# Patient Record
Sex: Female | Born: 1965
Health system: Southern US, Community
[De-identification: ages and names within clinical notes are randomized; demographics above are authoritative.]

## PROBLEM LIST (undated history)

## (undated) DIAGNOSIS — E1165 Type 2 diabetes mellitus with hyperglycemia: Secondary | ICD-10-CM

## (undated) DIAGNOSIS — I2781 Cor pulmonale (chronic): Secondary | ICD-10-CM

## (undated) DIAGNOSIS — I5033 Acute on chronic diastolic (congestive) heart failure: Secondary | ICD-10-CM

## (undated) DIAGNOSIS — I272 Pulmonary hypertension, unspecified: Secondary | ICD-10-CM

## (undated) DIAGNOSIS — I5032 Chronic diastolic (congestive) heart failure: Secondary | ICD-10-CM

## (undated) DIAGNOSIS — Z8639 Personal history of other endocrine, nutritional and metabolic disease: Secondary | ICD-10-CM

## (undated) DIAGNOSIS — G4733 Obstructive sleep apnea (adult) (pediatric): Secondary | ICD-10-CM

## (undated) DIAGNOSIS — R590 Localized enlarged lymph nodes: Secondary | ICD-10-CM

## (undated) DIAGNOSIS — Z6841 Body Mass Index (BMI) 40.0 and over, adult: Secondary | ICD-10-CM

## (undated) DIAGNOSIS — E162 Hypoglycemia, unspecified: Secondary | ICD-10-CM

## (undated) DIAGNOSIS — I119 Hypertensive heart disease without heart failure: Secondary | ICD-10-CM

## (undated) DIAGNOSIS — J329 Chronic sinusitis, unspecified: Secondary | ICD-10-CM

## (undated) DIAGNOSIS — I4819 Other persistent atrial fibrillation: Secondary | ICD-10-CM

## (undated) DIAGNOSIS — Z794 Long term (current) use of insulin: Secondary | ICD-10-CM

## (undated) DIAGNOSIS — I4891 Unspecified atrial fibrillation: Secondary | ICD-10-CM

## (undated) DIAGNOSIS — IMO0002 Reserved for concepts with insufficient information to code with codable children: Secondary | ICD-10-CM

## (undated) DIAGNOSIS — R079 Chest pain, unspecified: Secondary | ICD-10-CM

## (undated) DIAGNOSIS — I10 Essential (primary) hypertension: Secondary | ICD-10-CM

## (undated) DIAGNOSIS — I471 Supraventricular tachycardia: Secondary | ICD-10-CM

## (undated) DIAGNOSIS — F319 Bipolar disorder, unspecified: Secondary | ICD-10-CM

## (undated) HISTORY — DX: Chronic sinusitis, unspecified: J32.9

## (undated) HISTORY — DX: Essential (primary) hypertension: I10

## (undated) HISTORY — PX: OTHER SURGICAL HISTORY: SHX169

## (undated) HISTORY — DX: Obstructive sleep apnea (adult) (pediatric): G47.33

## (undated) HISTORY — DX: Morbid (severe) obesity due to excess calories: E66.01

## (undated) HISTORY — DX: Type 2 diabetes mellitus with hyperglycemia: E11.65

## (undated) HISTORY — DX: Cor pulmonale (chronic): I27.81

## (undated) HISTORY — DX: Acute on chronic diastolic (congestive) heart failure: I50.33

## (undated) HISTORY — DX: Supraventricular tachycardia: I47.1

## (undated) HISTORY — DX: Localized enlarged lymph nodes: R59.0

## (undated) HISTORY — DX: Pulmonary hypertension, unspecified: I27.20

---

## 2002-02-02 ENCOUNTER — Encounter (INDEPENDENT_AMBULATORY_CARE_PROVIDER_SITE_OTHER): Payer: Self-pay | Admitting: Family Medicine

## 2002-02-02 LAB — CONVERTED CEMR LAB
RBC count: 4.54 10*6/uL
WBC, blood: 3.9 10*3/uL

## 2003-06-11 ENCOUNTER — Encounter (INDEPENDENT_AMBULATORY_CARE_PROVIDER_SITE_OTHER): Payer: Self-pay | Admitting: Family Medicine

## 2003-06-11 LAB — CONVERTED CEMR LAB
TSH: 3.398 microintl units/mL
TSH: 3.398 microintl units/mL

## 2004-06-10 ENCOUNTER — Ambulatory Visit: Payer: Self-pay | Admitting: Internal Medicine

## 2004-06-10 ENCOUNTER — Ambulatory Visit: Payer: Self-pay | Admitting: Family Medicine

## 2004-07-04 ENCOUNTER — Ambulatory Visit: Payer: Self-pay | Admitting: *Deleted

## 2004-07-23 ENCOUNTER — Ambulatory Visit: Payer: Self-pay | Admitting: Family Medicine

## 2004-09-18 ENCOUNTER — Ambulatory Visit: Payer: Self-pay | Admitting: Internal Medicine

## 2005-01-14 ENCOUNTER — Ambulatory Visit: Payer: Self-pay | Admitting: Family Medicine

## 2005-01-16 ENCOUNTER — Ambulatory Visit: Payer: Self-pay | Admitting: Family Medicine

## 2005-01-16 LAB — CONVERTED CEMR LAB: Urinalysis: ABNORMAL

## 2005-01-27 ENCOUNTER — Ambulatory Visit: Payer: Self-pay | Admitting: Family Medicine

## 2005-02-17 ENCOUNTER — Ambulatory Visit: Payer: Self-pay | Admitting: Family Medicine

## 2005-06-19 ENCOUNTER — Ambulatory Visit: Payer: Self-pay | Admitting: Family Medicine

## 2005-07-23 ENCOUNTER — Ambulatory Visit: Payer: Self-pay | Admitting: Family Medicine

## 2005-07-28 ENCOUNTER — Ambulatory Visit: Payer: Self-pay | Admitting: Family Medicine

## 2005-07-28 LAB — CONVERTED CEMR LAB

## 2005-08-25 ENCOUNTER — Ambulatory Visit (HOSPITAL_COMMUNITY): Admission: RE | Admit: 2005-08-25 | Discharge: 2005-08-25 | Payer: Self-pay | Admitting: Family Medicine

## 2005-09-15 ENCOUNTER — Encounter: Admission: RE | Admit: 2005-09-15 | Discharge: 2005-09-15 | Payer: Self-pay | Admitting: Family Medicine

## 2006-03-30 ENCOUNTER — Ambulatory Visit: Payer: Self-pay | Admitting: Family Medicine

## 2006-03-31 ENCOUNTER — Encounter (INDEPENDENT_AMBULATORY_CARE_PROVIDER_SITE_OTHER): Payer: Self-pay | Admitting: Family Medicine

## 2006-03-31 LAB — CONVERTED CEMR LAB: Microalbumin U total vol: 0.84 mg/L

## 2006-05-05 ENCOUNTER — Encounter: Admission: RE | Admit: 2006-05-05 | Discharge: 2006-05-05 | Payer: Self-pay | Admitting: Family Medicine

## 2006-05-17 ENCOUNTER — Ambulatory Visit: Payer: Self-pay | Admitting: Family Medicine

## 2006-05-17 ENCOUNTER — Inpatient Hospital Stay (HOSPITAL_COMMUNITY): Admission: EM | Admit: 2006-05-17 | Discharge: 2006-05-19 | Payer: Self-pay | Admitting: Emergency Medicine

## 2006-05-18 ENCOUNTER — Encounter (INDEPENDENT_AMBULATORY_CARE_PROVIDER_SITE_OTHER): Payer: Self-pay | Admitting: Cardiology

## 2006-05-21 ENCOUNTER — Emergency Department (HOSPITAL_COMMUNITY): Admission: EM | Admit: 2006-05-21 | Discharge: 2006-05-21 | Payer: Self-pay | Admitting: Emergency Medicine

## 2006-05-25 ENCOUNTER — Ambulatory Visit: Payer: Self-pay | Admitting: Family Medicine

## 2006-05-26 ENCOUNTER — Ambulatory Visit: Payer: Self-pay | Admitting: Family Medicine

## 2006-07-13 ENCOUNTER — Ambulatory Visit: Payer: Self-pay | Admitting: Family Medicine

## 2006-08-23 ENCOUNTER — Ambulatory Visit: Payer: Self-pay | Admitting: Family Medicine

## 2006-09-02 ENCOUNTER — Ambulatory Visit: Payer: Self-pay | Admitting: Family Medicine

## 2006-09-02 LAB — CONVERTED CEMR LAB
Blood Glucose, Fasting: 115 mg/dL
Hgb A1c MFr Bld: 6.1 %

## 2006-10-12 ENCOUNTER — Encounter: Admission: RE | Admit: 2006-10-12 | Discharge: 2006-10-12 | Payer: Self-pay | Admitting: Family Medicine

## 2006-10-12 ENCOUNTER — Ambulatory Visit: Payer: Self-pay | Admitting: Family Medicine

## 2007-03-10 ENCOUNTER — Ambulatory Visit: Payer: Self-pay | Admitting: Family Medicine

## 2007-04-18 ENCOUNTER — Encounter (INDEPENDENT_AMBULATORY_CARE_PROVIDER_SITE_OTHER): Payer: Self-pay | Admitting: Family Medicine

## 2007-04-18 DIAGNOSIS — E669 Obesity, unspecified: Secondary | ICD-10-CM | POA: Insufficient documentation

## 2007-04-18 DIAGNOSIS — D649 Anemia, unspecified: Secondary | ICD-10-CM

## 2007-04-18 DIAGNOSIS — F411 Generalized anxiety disorder: Secondary | ICD-10-CM

## 2007-04-18 DIAGNOSIS — E039 Hypothyroidism, unspecified: Secondary | ICD-10-CM | POA: Insufficient documentation

## 2007-04-18 DIAGNOSIS — Z8679 Personal history of other diseases of the circulatory system: Secondary | ICD-10-CM | POA: Insufficient documentation

## 2007-04-19 ENCOUNTER — Encounter: Payer: Self-pay | Admitting: Internal Medicine

## 2007-04-19 ENCOUNTER — Ambulatory Visit: Payer: Self-pay | Admitting: Internal Medicine

## 2007-04-19 LAB — CONVERTED CEMR LAB
Preg, Serum: NEGATIVE
TSH: 4.143 microintl units/mL (ref 0.350–5.50)

## 2007-04-26 ENCOUNTER — Ambulatory Visit (HOSPITAL_COMMUNITY): Admission: RE | Admit: 2007-04-26 | Discharge: 2007-04-26 | Payer: Self-pay | Admitting: Internal Medicine

## 2007-05-02 ENCOUNTER — Encounter: Admission: RE | Admit: 2007-05-02 | Discharge: 2007-05-02 | Payer: Self-pay | Admitting: Family Medicine

## 2007-05-11 ENCOUNTER — Ambulatory Visit: Payer: Self-pay | Admitting: Internal Medicine

## 2007-06-15 ENCOUNTER — Encounter (INDEPENDENT_AMBULATORY_CARE_PROVIDER_SITE_OTHER): Payer: Self-pay | Admitting: *Deleted

## 2007-07-04 ENCOUNTER — Ambulatory Visit (HOSPITAL_COMMUNITY): Admission: RE | Admit: 2007-07-04 | Discharge: 2007-07-04 | Payer: Self-pay | Admitting: Internal Medicine

## 2007-07-21 ENCOUNTER — Ambulatory Visit: Payer: Self-pay | Admitting: Internal Medicine

## 2007-07-26 ENCOUNTER — Ambulatory Visit: Payer: Self-pay | Admitting: Internal Medicine

## 2007-08-11 ENCOUNTER — Ambulatory Visit: Payer: Self-pay | Admitting: Internal Medicine

## 2007-08-11 LAB — CONVERTED CEMR LAB: Microalb, Ur: 12.3 mg/dL — ABNORMAL HIGH (ref 0.00–1.89)

## 2007-09-12 ENCOUNTER — Ambulatory Visit: Payer: Self-pay | Admitting: Internal Medicine

## 2007-10-14 ENCOUNTER — Ambulatory Visit: Payer: Self-pay | Admitting: Internal Medicine

## 2007-11-09 ENCOUNTER — Ambulatory Visit: Payer: Self-pay | Admitting: Internal Medicine

## 2007-12-15 ENCOUNTER — Encounter (INDEPENDENT_AMBULATORY_CARE_PROVIDER_SITE_OTHER): Payer: Self-pay | Admitting: Family Medicine

## 2007-12-15 ENCOUNTER — Ambulatory Visit: Payer: Self-pay | Admitting: Internal Medicine

## 2007-12-15 LAB — CONVERTED CEMR LAB
ALT: 11 units/L (ref 0–35)
AST: 21 units/L (ref 0–37)
Albumin: 4.1 g/dL (ref 3.5–5.2)
Alkaline Phosphatase: 116 units/L (ref 39–117)
BUN: 7 mg/dL (ref 6–23)
Basophils Absolute: 0 10*3/uL (ref 0.0–0.1)
Basophils Relative: 0 % (ref 0–1)
Cholesterol: 158 mg/dL (ref 0–200)
Creatinine, Ser: 0.46 mg/dL (ref 0.40–1.20)
Eosinophils Absolute: 0.1 10*3/uL (ref 0.0–0.7)
Eosinophils Relative: 1 % (ref 0–5)
Glucose, Bld: 165 mg/dL — ABNORMAL HIGH (ref 70–99)
HCT: 41.2 % (ref 36.0–46.0)
HDL: 41 mg/dL (ref >39)
Hemoglobin: 12.4 g/dL (ref 12.0–15.0)
LDL Cholesterol: 80 mg/dL (ref 0–99)
Lymphocytes Relative: 34 % (ref 12–46)
MCHC: 30.1 g/dL (ref 30.0–36.0)
MCV: 90.7 fL (ref 78.0–100.0)
Monocytes Absolute: 0.4 10*3/uL (ref 0.1–1.0)
Monocytes Relative: 6 % (ref 3–12)
Neutro Abs: 3.6 10*3/uL (ref 1.7–7.7)
Neutrophils Relative %: 58 % (ref 43–77)
Platelets: 268 10*3/uL (ref 150–400)
Potassium: 4.5 meq/L (ref 3.5–5.3)
RBC: 4.54 M/uL (ref 3.87–5.11)
RDW: 14.1 % (ref 11.5–15.5)
Sodium: 139 meq/L (ref 135–145)
Total Bilirubin: 0.4 mg/dL (ref 0.3–1.2)
Total CHOL/HDL Ratio: 3.9
Total Protein: 7.5 g/dL (ref 6.0–8.3)
Triglycerides: 183 mg/dL — ABNORMAL HIGH (ref <150)
VLDL: 37 mg/dL (ref 0–40)
WBC: 6.2 10*3/uL (ref 4.0–10.5)

## 2008-03-13 ENCOUNTER — Ambulatory Visit: Payer: Self-pay | Admitting: Internal Medicine

## 2008-05-14 ENCOUNTER — Ambulatory Visit: Payer: Self-pay | Admitting: Internal Medicine

## 2008-11-16 ENCOUNTER — Ambulatory Visit: Payer: Self-pay | Admitting: Internal Medicine

## 2009-01-16 ENCOUNTER — Emergency Department (HOSPITAL_COMMUNITY): Admission: EM | Admit: 2009-01-16 | Discharge: 2009-01-16 | Payer: Self-pay | Admitting: Emergency Medicine

## 2009-01-16 ENCOUNTER — Encounter (INDEPENDENT_AMBULATORY_CARE_PROVIDER_SITE_OTHER): Payer: Self-pay | Admitting: Emergency Medicine

## 2009-01-16 ENCOUNTER — Ambulatory Visit: Payer: Self-pay | Admitting: Vascular Surgery

## 2009-01-22 ENCOUNTER — Emergency Department (HOSPITAL_COMMUNITY): Admission: EM | Admit: 2009-01-22 | Discharge: 2009-01-22 | Payer: Self-pay | Admitting: Family Medicine

## 2009-01-30 ENCOUNTER — Ambulatory Visit: Payer: Self-pay | Admitting: Internal Medicine

## 2009-02-20 ENCOUNTER — Inpatient Hospital Stay (HOSPITAL_COMMUNITY): Admission: AD | Admit: 2009-02-20 | Discharge: 2009-03-02 | Payer: Self-pay | Admitting: Psychiatry

## 2009-02-20 ENCOUNTER — Emergency Department (HOSPITAL_COMMUNITY): Admission: EM | Admit: 2009-02-20 | Discharge: 2009-02-20 | Payer: Self-pay | Admitting: Emergency Medicine

## 2009-02-20 ENCOUNTER — Ambulatory Visit: Payer: Self-pay | Admitting: Psychiatry

## 2009-03-13 ENCOUNTER — Ambulatory Visit: Payer: Self-pay | Admitting: Internal Medicine

## 2009-03-14 ENCOUNTER — Ambulatory Visit: Payer: Self-pay | Admitting: Internal Medicine

## 2009-03-20 ENCOUNTER — Ambulatory Visit: Payer: Self-pay | Admitting: Internal Medicine

## 2009-03-22 ENCOUNTER — Inpatient Hospital Stay (HOSPITAL_COMMUNITY): Admission: EM | Admit: 2009-03-22 | Discharge: 2009-03-24 | Payer: Self-pay | Admitting: Emergency Medicine

## 2009-03-22 ENCOUNTER — Encounter: Payer: Self-pay | Admitting: Internal Medicine

## 2009-03-22 ENCOUNTER — Ambulatory Visit: Payer: Self-pay | Admitting: Infectious Diseases

## 2009-03-25 ENCOUNTER — Ambulatory Visit: Payer: Self-pay | Admitting: Internal Medicine

## 2009-04-06 ENCOUNTER — Emergency Department (HOSPITAL_BASED_OUTPATIENT_CLINIC_OR_DEPARTMENT_OTHER): Admission: EM | Admit: 2009-04-06 | Discharge: 2009-04-07 | Payer: Self-pay | Admitting: Emergency Medicine

## 2009-04-09 ENCOUNTER — Encounter: Payer: Self-pay | Admitting: Cardiology

## 2009-04-17 DIAGNOSIS — F319 Bipolar disorder, unspecified: Secondary | ICD-10-CM | POA: Insufficient documentation

## 2009-04-24 ENCOUNTER — Ambulatory Visit: Payer: Self-pay | Admitting: Internal Medicine

## 2009-04-29 DIAGNOSIS — R609 Edema, unspecified: Secondary | ICD-10-CM

## 2009-04-29 DIAGNOSIS — N390 Urinary tract infection, site not specified: Secondary | ICD-10-CM | POA: Insufficient documentation

## 2009-04-29 DIAGNOSIS — I214 Non-ST elevation (NSTEMI) myocardial infarction: Secondary | ICD-10-CM

## 2009-04-29 DIAGNOSIS — E0590 Thyrotoxicosis, unspecified without thyrotoxic crisis or storm: Secondary | ICD-10-CM | POA: Insufficient documentation

## 2009-04-30 ENCOUNTER — Ambulatory Visit: Payer: Self-pay | Admitting: Cardiology

## 2009-05-06 ENCOUNTER — Ambulatory Visit: Payer: Self-pay

## 2009-05-07 ENCOUNTER — Ambulatory Visit: Payer: Self-pay

## 2009-05-07 ENCOUNTER — Ambulatory Visit: Payer: Self-pay | Admitting: Cardiology

## 2009-05-07 ENCOUNTER — Encounter: Payer: Self-pay | Admitting: Cardiology

## 2009-05-15 ENCOUNTER — Ambulatory Visit: Payer: Self-pay | Admitting: Cardiology

## 2009-05-16 LAB — CONVERTED CEMR LAB
CO2: 31 meq/L (ref 19–32)
Calcium: 8.7 mg/dL (ref 8.4–10.5)
Creatinine, Ser: 0.6 mg/dL (ref 0.4–1.2)
GFR calc non Af Amer: 141.53 mL/min (ref >60)
Glucose, Bld: 86 mg/dL (ref 70–99)
Sodium: 137 meq/L (ref 135–145)

## 2009-06-05 ENCOUNTER — Ambulatory Visit: Payer: Self-pay | Admitting: Family Medicine

## 2009-06-12 ENCOUNTER — Encounter: Payer: Self-pay | Admitting: Family Medicine

## 2009-06-12 ENCOUNTER — Ambulatory Visit: Payer: Self-pay | Admitting: Internal Medicine

## 2009-06-12 LAB — CONVERTED CEMR LAB
ALT: <8 units/L (ref 0–35)
AST: 20 units/L (ref 0–37)
Alkaline Phosphatase: 78 units/L (ref 39–117)
BUN: 11 mg/dL (ref 6–23)
Calcium: 9 mg/dL (ref 8.4–10.5)
Chloride: 104 meq/L (ref 96–112)
Creatinine, Ser: 0.61 mg/dL (ref 0.40–1.20)
HDL: 56 mg/dL (ref >39)
LDL Cholesterol: 73 mg/dL (ref 0–99)
Pro B Natriuretic peptide (BNP): 40.9 pg/mL (ref 0.0–100.0)
Total CHOL/HDL Ratio: 2.6
VLDL: 15 mg/dL (ref 0–40)

## 2009-06-14 ENCOUNTER — Ambulatory Visit: Payer: Self-pay | Admitting: Internal Medicine

## 2009-07-04 ENCOUNTER — Ambulatory Visit: Payer: Self-pay | Admitting: Cardiology

## 2009-07-18 ENCOUNTER — Ambulatory Visit: Payer: Self-pay | Admitting: Vascular Surgery

## 2009-07-24 ENCOUNTER — Emergency Department (HOSPITAL_COMMUNITY): Admission: EM | Admit: 2009-07-24 | Discharge: 2009-07-24 | Payer: Self-pay | Admitting: Emergency Medicine

## 2009-07-30 ENCOUNTER — Telehealth (INDEPENDENT_AMBULATORY_CARE_PROVIDER_SITE_OTHER): Payer: Self-pay | Admitting: *Deleted

## 2009-07-31 ENCOUNTER — Ambulatory Visit: Payer: Self-pay | Admitting: Internal Medicine

## 2009-08-02 ENCOUNTER — Ambulatory Visit: Payer: Self-pay | Admitting: Internal Medicine

## 2009-08-06 ENCOUNTER — Encounter: Payer: Self-pay | Admitting: Family Medicine

## 2009-08-06 ENCOUNTER — Ambulatory Visit (HOSPITAL_COMMUNITY): Admission: RE | Admit: 2009-08-06 | Discharge: 2009-08-06 | Payer: Self-pay | Admitting: Family Medicine

## 2009-08-21 ENCOUNTER — Ambulatory Visit: Payer: Self-pay | Admitting: Internal Medicine

## 2009-08-26 ENCOUNTER — Ambulatory Visit: Payer: Self-pay | Admitting: Family Medicine

## 2009-08-26 DIAGNOSIS — M1710 Unilateral primary osteoarthritis, unspecified knee: Secondary | ICD-10-CM

## 2009-11-04 ENCOUNTER — Emergency Department (HOSPITAL_COMMUNITY): Admission: EM | Admit: 2009-11-04 | Discharge: 2009-11-05 | Payer: Self-pay | Admitting: Emergency Medicine

## 2009-11-04 ENCOUNTER — Ambulatory Visit: Payer: Self-pay | Admitting: Family Medicine

## 2009-11-04 ENCOUNTER — Encounter (INDEPENDENT_AMBULATORY_CARE_PROVIDER_SITE_OTHER): Payer: Self-pay | Admitting: Internal Medicine

## 2009-11-04 LAB — CONVERTED CEMR LAB: Sed Rate: 9 mm/hr (ref 0–22)

## 2009-11-08 ENCOUNTER — Ambulatory Visit: Payer: Self-pay | Admitting: Internal Medicine

## 2009-11-11 ENCOUNTER — Ambulatory Visit: Payer: Self-pay | Admitting: Internal Medicine

## 2009-11-14 ENCOUNTER — Ambulatory Visit: Payer: Self-pay | Admitting: Internal Medicine

## 2009-11-14 LAB — CONVERTED CEMR LAB
BUN: 14 mg/dL (ref 6–23)
Calcium: 9.6 mg/dL (ref 8.4–10.5)
Creatinine, Ser: 0.61 mg/dL (ref 0.40–1.20)
Glucose, Bld: 119 mg/dL — ABNORMAL HIGH (ref 70–99)

## 2009-11-21 ENCOUNTER — Ambulatory Visit: Payer: Self-pay | Admitting: Internal Medicine

## 2009-11-28 ENCOUNTER — Ambulatory Visit: Payer: Self-pay | Admitting: Internal Medicine

## 2009-12-05 ENCOUNTER — Ambulatory Visit: Payer: Self-pay | Admitting: Internal Medicine

## 2009-12-19 ENCOUNTER — Ambulatory Visit: Payer: Self-pay | Admitting: Internal Medicine

## 2009-12-26 ENCOUNTER — Ambulatory Visit: Payer: Self-pay | Admitting: Internal Medicine

## 2009-12-26 LAB — CONVERTED CEMR LAB
AST: 24 units/L (ref 0–37)
BUN: 19 mg/dL (ref 6–23)
Basophils Relative: 0 % (ref 0–1)
CO2: 24 meq/L (ref 19–32)
CRP: 0.8 mg/dL — ABNORMAL HIGH (ref <0.6)
Calcium: 9.5 mg/dL (ref 8.4–10.5)
Chloride: 99 meq/L (ref 96–112)
Creatinine, Ser: 0.8 mg/dL (ref 0.40–1.20)
Eosinophils Absolute: 0 10*3/uL (ref 0.0–0.7)
Eosinophils Relative: 1 % (ref 0–5)
Ferritin: 26 ng/mL (ref 10–291)
HCT: 39.2 % (ref 36.0–46.0)
Iron: 65 ug/dL (ref 42–145)
Lymphs Abs: 2.3 10*3/uL (ref 0.7–4.0)
MCHC: 32.9 g/dL (ref 30.0–36.0)
MCV: 85.4 fL (ref 78.0–100.0)
Neutrophils Relative %: 62 % (ref 43–77)
Platelets: 303 10*3/uL (ref 150–400)
RDW: 13.4 % (ref 11.5–15.5)
Total Bilirubin: 0.3 mg/dL (ref 0.3–1.2)
UIBC: 369 ug/dL
WBC: 7.5 10*3/uL (ref 4.0–10.5)

## 2010-01-15 ENCOUNTER — Ambulatory Visit: Payer: Self-pay | Admitting: Internal Medicine

## 2010-04-15 ENCOUNTER — Ambulatory Visit: Payer: Self-pay | Admitting: Internal Medicine

## 2010-04-16 ENCOUNTER — Encounter: Admission: RE | Admit: 2010-04-16 | Discharge: 2010-04-16 | Payer: Self-pay | Admitting: Family Medicine

## 2010-04-16 ENCOUNTER — Encounter (INDEPENDENT_AMBULATORY_CARE_PROVIDER_SITE_OTHER): Payer: Self-pay | Admitting: Family Medicine

## 2010-04-16 LAB — CONVERTED CEMR LAB: Microalb, Ur: 2.53 mg/dL — ABNORMAL HIGH (ref 0.00–1.89)

## 2010-04-30 ENCOUNTER — Ambulatory Visit: Payer: Self-pay | Admitting: Internal Medicine

## 2010-04-30 ENCOUNTER — Encounter (INDEPENDENT_AMBULATORY_CARE_PROVIDER_SITE_OTHER): Payer: Self-pay | Admitting: Family Medicine

## 2010-04-30 LAB — CONVERTED CEMR LAB
AST: 24 units/L (ref 0–37)
Alkaline Phosphatase: 100 units/L (ref 39–117)
BUN: 7 mg/dL (ref 6–23)
Creatinine, Ser: 0.58 mg/dL (ref 0.40–1.20)
HDL: 54 mg/dL (ref >39)
LDL Cholesterol: 100 mg/dL — ABNORMAL HIGH (ref 0–99)
TSH: 3.269 microintl units/mL (ref 0.350–4.500)
Total CHOL/HDL Ratio: 3.4
VLDL: 30 mg/dL (ref 0–40)
Vit D, 25-Hydroxy: 17 ng/mL — ABNORMAL LOW (ref 30–89)

## 2010-05-13 ENCOUNTER — Ambulatory Visit: Payer: Self-pay | Admitting: Internal Medicine

## 2010-08-26 ENCOUNTER — Encounter (INDEPENDENT_AMBULATORY_CARE_PROVIDER_SITE_OTHER): Payer: Self-pay | Admitting: *Deleted

## 2010-08-26 LAB — CONVERTED CEMR LAB
Chloride: 97 meq/L (ref 96–112)
Creatinine, Ser: 0.87 mg/dL (ref 0.40–1.20)
Platelets: 274 10*3/uL (ref 150–400)
Potassium: 4.1 meq/L (ref 3.5–5.3)
RDW: 13.6 % (ref 11.5–15.5)
Sodium: 137 meq/L (ref 135–145)

## 2010-10-19 ENCOUNTER — Encounter: Payer: Self-pay | Admitting: Internal Medicine

## 2010-10-26 LAB — CONVERTED CEMR LAB
BUN: 13 mg/dL (ref 6–23)
CO2: 31 meq/L (ref 19–32)
Calcium: 8.6 mg/dL (ref 8.4–10.5)
Creatinine, Ser: 0.6 mg/dL (ref 0.4–1.2)
Pro B Natriuretic peptide (BNP): 342 pg/mL — ABNORMAL HIGH (ref 0.0–100.0)

## 2010-12-17 LAB — URINALYSIS, ROUTINE W REFLEX MICROSCOPIC
Glucose, UA: 250 mg/dL — AB
Hgb urine dipstick: NEGATIVE
Specific Gravity, Urine: 1.02 (ref 1.005–1.030)
Urobilinogen, UA: 0.2 mg/dL (ref 0.0–1.0)

## 2010-12-17 LAB — DIFFERENTIAL
Basophils Relative: 1 % (ref 0–1)
Lymphocytes Relative: 34 % (ref 12–46)
Lymphs Abs: 2.4 10*3/uL (ref 0.7–4.0)
Monocytes Relative: 9 % (ref 3–12)
Neutro Abs: 4 10*3/uL (ref 1.7–7.7)
Neutrophils Relative %: 56 % (ref 43–77)

## 2010-12-17 LAB — COMPREHENSIVE METABOLIC PANEL
BUN: 15 mg/dL (ref 6–23)
Calcium: 9.1 mg/dL (ref 8.4–10.5)
Creatinine, Ser: 0.57 mg/dL (ref 0.4–1.2)
Glucose, Bld: 198 mg/dL — ABNORMAL HIGH (ref 70–99)
Total Protein: 7.5 g/dL (ref 6.0–8.3)

## 2010-12-17 LAB — URINE MICROSCOPIC-ADD ON

## 2010-12-17 LAB — CBC
HCT: 37.1 % (ref 36.0–46.0)
Hemoglobin: 12.4 g/dL (ref 12.0–15.0)
MCHC: 33.4 g/dL (ref 30.0–36.0)
RDW: 13.9 % (ref 11.5–15.5)

## 2010-12-17 LAB — RAPID URINE DRUG SCREEN, HOSP PERFORMED
Barbiturates: NOT DETECTED
Opiates: NOT DETECTED

## 2010-12-19 ENCOUNTER — Emergency Department (HOSPITAL_COMMUNITY)
Admission: EM | Admit: 2010-12-19 | Discharge: 2010-12-20 | Disposition: A | Discharge status: Home / Self Care | Payer: Self-pay | Attending: Emergency Medicine | Admitting: Emergency Medicine

## 2010-12-19 DIAGNOSIS — I10 Essential (primary) hypertension: Secondary | ICD-10-CM | POA: Insufficient documentation

## 2010-12-19 DIAGNOSIS — E669 Obesity, unspecified: Secondary | ICD-10-CM | POA: Insufficient documentation

## 2010-12-19 DIAGNOSIS — I252 Old myocardial infarction: Secondary | ICD-10-CM | POA: Insufficient documentation

## 2010-12-19 DIAGNOSIS — F319 Bipolar disorder, unspecified: Secondary | ICD-10-CM | POA: Insufficient documentation

## 2010-12-19 DIAGNOSIS — I509 Heart failure, unspecified: Secondary | ICD-10-CM | POA: Insufficient documentation

## 2010-12-19 DIAGNOSIS — E1169 Type 2 diabetes mellitus with other specified complication: Secondary | ICD-10-CM | POA: Insufficient documentation

## 2010-12-19 LAB — CBC
MCV: 87.7 fL (ref 78.0–100.0)
Platelets: 225 10*3/uL (ref 150–400)
RBC: 4.46 MIL/uL (ref 3.87–5.11)
WBC: 8.7 10*3/uL (ref 4.0–10.5)

## 2010-12-19 LAB — DIFFERENTIAL
Basophils Relative: 0 % (ref 0–1)
Eosinophils Absolute: 0 10*3/uL (ref 0.0–0.7)
Lymphs Abs: 2.5 10*3/uL (ref 0.7–4.0)
Neutrophils Relative %: 63 % (ref 43–77)

## 2010-12-19 LAB — POCT I-STAT, CHEM 8
Calcium, Ion: 1.02 mmol/L — ABNORMAL LOW (ref 1.12–1.32)
Chloride: 103 mEq/L (ref 96–112)
HCT: 40 % (ref 36.0–46.0)

## 2010-12-19 LAB — GLUCOSE, CAPILLARY

## 2010-12-20 LAB — GLUCOSE, CAPILLARY
Glucose-Capillary: 290 mg/dL — ABNORMAL HIGH (ref 70–99)
Glucose-Capillary: 340 mg/dL — ABNORMAL HIGH (ref 70–99)

## 2010-12-21 ENCOUNTER — Emergency Department (HOSPITAL_COMMUNITY): Payer: Self-pay

## 2010-12-21 ENCOUNTER — Inpatient Hospital Stay (HOSPITAL_COMMUNITY)
Admission: EM | Admit: 2010-12-21 | Discharge: 2011-01-01 | DRG: 885 | Disposition: A | Discharge status: Home Health | Payer: Self-pay | Attending: Internal Medicine | Admitting: Internal Medicine

## 2010-12-21 DIAGNOSIS — I509 Heart failure, unspecified: Secondary | ICD-10-CM | POA: Diagnosis present

## 2010-12-21 DIAGNOSIS — F3110 Bipolar disorder, current episode manic without psychotic features, unspecified: Principal | ICD-10-CM | POA: Diagnosis present

## 2010-12-21 DIAGNOSIS — I5032 Chronic diastolic (congestive) heart failure: Secondary | ICD-10-CM | POA: Diagnosis present

## 2010-12-21 DIAGNOSIS — D539 Nutritional anemia, unspecified: Secondary | ICD-10-CM | POA: Diagnosis not present

## 2010-12-21 DIAGNOSIS — I10 Essential (primary) hypertension: Secondary | ICD-10-CM | POA: Diagnosis present

## 2010-12-21 DIAGNOSIS — G4700 Insomnia, unspecified: Secondary | ICD-10-CM | POA: Diagnosis present

## 2010-12-21 DIAGNOSIS — F411 Generalized anxiety disorder: Secondary | ICD-10-CM | POA: Diagnosis present

## 2010-12-21 DIAGNOSIS — N920 Excessive and frequent menstruation with regular cycle: Secondary | ICD-10-CM | POA: Diagnosis present

## 2010-12-21 DIAGNOSIS — E876 Hypokalemia: Secondary | ICD-10-CM | POA: Diagnosis not present

## 2010-12-21 DIAGNOSIS — E781 Pure hyperglyceridemia: Secondary | ICD-10-CM | POA: Diagnosis present

## 2010-12-21 DIAGNOSIS — Z7982 Long term (current) use of aspirin: Secondary | ICD-10-CM

## 2010-12-21 DIAGNOSIS — IMO0001 Reserved for inherently not codable concepts without codable children: Secondary | ICD-10-CM | POA: Diagnosis present

## 2010-12-21 DIAGNOSIS — Z87891 Personal history of nicotine dependence: Secondary | ICD-10-CM

## 2010-12-21 DIAGNOSIS — Z79899 Other long term (current) drug therapy: Secondary | ICD-10-CM

## 2010-12-21 LAB — POCT I-STAT 3, VENOUS BLOOD GAS (G3P V)
Bicarbonate: 27.9 mEq/L — ABNORMAL HIGH (ref 20.0–24.0)
TCO2: 29 mmol/L (ref 0–100)
pCO2, Ven: 39 mmHg — ABNORMAL LOW (ref 45.0–50.0)
pH, Ven: 7.462 — ABNORMAL HIGH (ref 7.250–7.300)

## 2010-12-21 LAB — POCT I-STAT, CHEM 8
BUN: 14 mg/dL (ref 6–23)
Chloride: 98 mEq/L (ref 96–112)
Potassium: 3.6 mEq/L (ref 3.5–5.1)
Sodium: 139 mEq/L (ref 135–145)

## 2010-12-21 LAB — COMPREHENSIVE METABOLIC PANEL
ALT: 32 U/L (ref 0–35)
AST: 65 U/L — ABNORMAL HIGH (ref 0–37)
Albumin: 3.4 g/dL — ABNORMAL LOW (ref 3.5–5.2)
Alkaline Phosphatase: 78 U/L (ref 39–117)
Calcium: 8.2 mg/dL — ABNORMAL LOW (ref 8.4–10.5)
GFR calc Af Amer: >60 mL/min (ref >60)
Glucose, Bld: 363 mg/dL — ABNORMAL HIGH (ref 70–99)
Potassium: 3.5 mEq/L (ref 3.5–5.1)
Sodium: 141 mEq/L (ref 135–145)
Total Protein: 6.5 g/dL (ref 6.0–8.3)

## 2010-12-21 LAB — GLUCOSE, CAPILLARY
Glucose-Capillary: 425 mg/dL — ABNORMAL HIGH (ref 70–99)
Glucose-Capillary: 314 mg/dL — ABNORMAL HIGH (ref 70–99)
Glucose-Capillary: 311 mg/dL — ABNORMAL HIGH (ref 70–99)

## 2010-12-21 LAB — CBC
HCT: 40.6 % (ref 36.0–46.0)
MCH: 28.9 pg (ref 26.0–34.0)
MCV: 88.8 fL (ref 78.0–100.0)
Platelets: 225 10*3/uL (ref 150–400)
RBC: 4.57 MIL/uL (ref 3.87–5.11)
RDW: 13.8 % (ref 11.5–15.5)

## 2010-12-21 LAB — BASIC METABOLIC PANEL
CO2: 24 mEq/L (ref 19–32)
Calcium: 8.1 mg/dL — ABNORMAL LOW (ref 8.4–10.5)
GFR calc Af Amer: >60 mL/min (ref >60)
GFR calc non Af Amer: >60 mL/min (ref >60)
Glucose, Bld: 356 mg/dL — ABNORMAL HIGH (ref 70–99)
Potassium: 3.6 mEq/L (ref 3.5–5.1)
Sodium: 138 mEq/L (ref 135–145)

## 2010-12-21 LAB — CK TOTAL AND CKMB (NOT AT ARMC)
CK, MB: 2.8 ng/mL (ref 0.3–4.0)
Relative Index: 0.7 (ref 0.0–2.5)
Total CK: 377 U/L — ABNORMAL HIGH (ref 7–177)

## 2010-12-21 LAB — DIFFERENTIAL
Eosinophils Absolute: 0 10*3/uL (ref 0.0–0.7)
Eosinophils Relative: 0 % (ref 0–5)
Lymphocytes Relative: 30 % (ref 12–46)
Lymphs Abs: 2.8 10*3/uL (ref 0.7–4.0)
Monocytes Relative: 8 % (ref 3–12)

## 2010-12-22 ENCOUNTER — Inpatient Hospital Stay (HOSPITAL_COMMUNITY): Payer: Self-pay

## 2010-12-22 ENCOUNTER — Encounter (HOSPITAL_COMMUNITY): Payer: Self-pay

## 2010-12-22 LAB — URINALYSIS, ROUTINE W REFLEX MICROSCOPIC
Bilirubin Urine: NEGATIVE
Hgb urine dipstick: NEGATIVE
Nitrite: NEGATIVE
Protein, ur: NEGATIVE mg/dL
Urobilinogen, UA: 0.2 mg/dL (ref 0.0–1.0)

## 2010-12-22 LAB — CARDIAC PANEL(CRET KIN+CKTOT+MB+TROPI)
CK, MB: 2.1 ng/mL (ref 0.3–4.0)
Relative Index: 0.9 (ref 0.0–2.5)
Total CK: 223 U/L — ABNORMAL HIGH (ref 7–177)

## 2010-12-22 LAB — HEPATIC FUNCTION PANEL
ALT: 27 U/L (ref 0–35)
AST: 65 U/L — ABNORMAL HIGH (ref 0–37)
Albumin: 3.2 g/dL — ABNORMAL LOW (ref 3.5–5.2)
Alkaline Phosphatase: 79 U/L (ref 39–117)
Total Bilirubin: 1.5 mg/dL — ABNORMAL HIGH (ref 0.3–1.2)

## 2010-12-22 LAB — LIPID PANEL
LDL Cholesterol: 73 mg/dL (ref 0–99)
Total CHOL/HDL Ratio: 4.7 RATIO
Triglycerides: 324 mg/dL — ABNORMAL HIGH (ref <150)
VLDL: 65 mg/dL — ABNORMAL HIGH (ref 0–40)

## 2010-12-22 LAB — RAPID URINE DRUG SCREEN, HOSP PERFORMED
Amphetamines: NOT DETECTED
Benzodiazepines: NOT DETECTED
Tetrahydrocannabinol: NOT DETECTED

## 2010-12-22 LAB — LACTATE DEHYDROGENASE: LDH: 267 U/L — ABNORMAL HIGH (ref 94–250)

## 2010-12-22 LAB — COMPREHENSIVE METABOLIC PANEL
AST: 52 U/L — ABNORMAL HIGH (ref 0–37)
Albumin: 3 g/dL — ABNORMAL LOW (ref 3.5–5.2)
BUN: 12 mg/dL (ref 6–23)
Chloride: 104 mEq/L (ref 96–112)
GFR calc non Af Amer: >60 mL/min (ref >60)
Potassium: 3.3 mEq/L — ABNORMAL LOW (ref 3.5–5.1)

## 2010-12-22 LAB — CBC
HCT: 38.5 % (ref 36.0–46.0)
Hemoglobin: 12 g/dL (ref 12.0–15.0)
MCV: 89.3 fL (ref 78.0–100.0)
Platelets: 199 10*3/uL (ref 150–400)
RBC: 4.31 MIL/uL (ref 3.87–5.11)
WBC: 5.9 10*3/uL (ref 4.0–10.5)

## 2010-12-22 LAB — GLUCOSE, CAPILLARY
Glucose-Capillary: 281 mg/dL — ABNORMAL HIGH (ref 70–99)
Glucose-Capillary: 282 mg/dL — ABNORMAL HIGH (ref 70–99)
Glucose-Capillary: 298 mg/dL — ABNORMAL HIGH (ref 70–99)

## 2010-12-22 LAB — T3, FREE: T3, Free: 2 pg/mL — ABNORMAL LOW (ref 2.3–4.2)

## 2010-12-22 LAB — MAGNESIUM: Magnesium: 1.7 mg/dL (ref 1.5–2.5)

## 2010-12-22 LAB — TSH: TSH: 2.568 u[IU]/mL (ref 0.350–4.500)

## 2010-12-22 LAB — T4, FREE: Free T4: 1.28 ng/dL (ref 0.80–1.80)

## 2010-12-22 LAB — HEMOGLOBIN A1C
Hgb A1c MFr Bld: 13 % — ABNORMAL HIGH (ref <5.7)
Mean Plasma Glucose: 326 mg/dL — ABNORMAL HIGH (ref <117)

## 2010-12-22 LAB — TECHNOLOGIST SMEAR REVIEW

## 2010-12-22 LAB — PREGNANCY, URINE: Preg Test, Ur: NEGATIVE

## 2010-12-22 LAB — RETICULOCYTES: Retic Count, Absolute: 33.8 10*3/uL (ref 19.0–186.0)

## 2010-12-22 MED ORDER — IOHEXOL 300 MG/ML  SOLN
100.0000 mL | Freq: Once | INTRAMUSCULAR | Status: AC | PRN
  Administered 2010-12-22: 100 mL via INTRAVENOUS

## 2010-12-23 LAB — GLUCOSE, CAPILLARY
Glucose-Capillary: 239 mg/dL — ABNORMAL HIGH (ref 70–99)
Glucose-Capillary: 261 mg/dL — ABNORMAL HIGH (ref 70–99)
Glucose-Capillary: 284 mg/dL — ABNORMAL HIGH (ref 70–99)
Glucose-Capillary: 479 mg/dL — ABNORMAL HIGH (ref 70–99)

## 2010-12-23 LAB — CBC
HCT: 40.8 % (ref 36.0–46.0)
Hemoglobin: 12.8 g/dL (ref 12.0–15.0)
MCH: 27.5 pg (ref 26.0–34.0)
MCHC: 31.4 g/dL (ref 30.0–36.0)
MCV: 87.7 fL (ref 78.0–100.0)

## 2010-12-23 LAB — COMPREHENSIVE METABOLIC PANEL
BUN: 5 mg/dL — ABNORMAL LOW (ref 6–23)
CO2: 28 mEq/L (ref 19–32)
Calcium: 8.5 mg/dL (ref 8.4–10.5)
Creatinine, Ser: 0.81 mg/dL (ref 0.4–1.2)
GFR calc Af Amer: >60 mL/min (ref >60)
GFR calc non Af Amer: >60 mL/min (ref >60)
Glucose, Bld: 233 mg/dL — ABNORMAL HIGH (ref 70–99)

## 2010-12-23 LAB — HAPTOGLOBIN: Haptoglobin: 267 mg/dL — ABNORMAL HIGH (ref 16–200)

## 2010-12-24 DIAGNOSIS — F312 Bipolar disorder, current episode manic severe with psychotic features: Secondary | ICD-10-CM

## 2010-12-24 LAB — GLUCOSE, CAPILLARY
Glucose-Capillary: 110 mg/dL — ABNORMAL HIGH (ref 70–99)
Glucose-Capillary: 305 mg/dL — ABNORMAL HIGH (ref 70–99)

## 2010-12-25 DIAGNOSIS — F312 Bipolar disorder, current episode manic severe with psychotic features: Secondary | ICD-10-CM

## 2010-12-25 LAB — BASIC METABOLIC PANEL
BUN: 14 mg/dL (ref 6–23)
Creatinine, Ser: 0.75 mg/dL (ref 0.4–1.2)
GFR calc Af Amer: >60 mL/min (ref >60)
GFR calc non Af Amer: >60 mL/min (ref >60)

## 2010-12-25 LAB — GLUCOSE, CAPILLARY
Glucose-Capillary: 144 mg/dL — ABNORMAL HIGH (ref 70–99)
Glucose-Capillary: 109 mg/dL — ABNORMAL HIGH (ref 70–99)
Glucose-Capillary: 129 mg/dL — ABNORMAL HIGH (ref 70–99)
Glucose-Capillary: 162 mg/dL — ABNORMAL HIGH (ref 70–99)
Glucose-Capillary: 178 mg/dL — ABNORMAL HIGH (ref 70–99)

## 2010-12-25 LAB — CBC
MCH: 28.3 pg (ref 26.0–34.0)
MCHC: 31.5 g/dL (ref 30.0–36.0)
Platelets: 211 10*3/uL (ref 150–400)
RBC: 4.42 MIL/uL (ref 3.87–5.11)

## 2010-12-26 LAB — GLUCOSE, CAPILLARY
Glucose-Capillary: 145 mg/dL — ABNORMAL HIGH (ref 70–99)
Glucose-Capillary: 114 mg/dL — ABNORMAL HIGH (ref 70–99)

## 2010-12-27 LAB — GLUCOSE, CAPILLARY
Glucose-Capillary: 194 mg/dL — ABNORMAL HIGH (ref 70–99)
Glucose-Capillary: 290 mg/dL — ABNORMAL HIGH (ref 70–99)
Glucose-Capillary: 272 mg/dL — ABNORMAL HIGH (ref 70–99)

## 2010-12-27 NOTE — Consult Note (Signed)
  [name], [name]               ACCOUNT NO.:  [id number]  MEDICAL RECORD NO.:  [id number]          PATIENT TYPE:  INP  LOCATION:                               FACILITY:  [facility]  PHYSICIAN:  Eulogio Ditch, MD DATE OF BIRTH:  [birth year]  DATE OF CONSULTATION:  12/23/2010 DATE OF DISCHARGE:                                CONSULTATION   REASON FOR CONSULTATION:  History of bipolar disorder, noncompliant with medications.  HISTORY OF PRESENT ILLNESS:  I saw the patient and reviewed the medical records.  I also spoke with Dr. Mahala Menghini.  [name] who has a history of bipolar disorder, she is currently confused, unable to provide any logical history.  The patient stopped taking medication 2 weeks back, on asking why she stopped the medication, she told me that she was feeling happy and good, so that is why she thought she do not need medication.  The patient's brother told me that the patient when talks with him on the phone while he was in [place], she appeared confused.  The patient lives with the mother who is currently out of town but will be coming back by tomorrow.  The patient was admitted to behavioral health in the past and stayed 10 days and was on Depakote and Risperdal.  Currently, she is on Latuda 80 mg p.o. daily and Depakote 1000 mg p.o. daily.  Her Depakote level is less than 10.  She is noncompliant with her medications.  The patient's brother also told me that the patient slept only 10 hours during the week time.  The patient's family is very much concerned about the patient.  I told the family that we will speak with the patient again tomorrow through a [place] translator.  MENTAL STATUS EXAM:  The patient is calm, cooperative, but unable to provide any logical history, seems to be internally preoccupied but denied hearing any voices.  Her affect is constricted, alert, awake, oriented to time, place and person.  Attention and concentration  poor. Abstraction ability poor.  Fund of knowledge poor.  Insight and judgment poor.  DIAGNOSES:  AXIS I:  As per history bipolar disorder, mixed type. AXIS II:  Deferred. AXIS III:  See medical notes. AXIS IV:  Noncompliant with her medications. AXIS V:  30 to 40.  RECOMMENDATIONS: 1. I discontinued Latuda and told Dr. Mahala Menghini to start the patient on     Haldol 10 mg at bedtime and start her on Depakote 1000 mg p.o.     daily. 2. I will follow up on this patient.     Eulogio Ditch, MD     SA/MEDQ  D:  12/23/2010  T:  12/23/2010  Job:  [id number]  Electronically Signed by Eulogio Ditch  on 12/27/2010 05:55:45 PM

## 2010-12-28 DIAGNOSIS — F312 Bipolar disorder, current episode manic severe with psychotic features: Secondary | ICD-10-CM

## 2010-12-28 LAB — CBC
MCH: 28.1 pg (ref 26.0–34.0)
MCV: 90.2 fL (ref 78.0–100.0)
Platelets: 192 10*3/uL (ref 150–400)
RBC: 4.17 MIL/uL (ref 3.87–5.11)

## 2010-12-28 LAB — GLUCOSE, CAPILLARY
Glucose-Capillary: 115 mg/dL — ABNORMAL HIGH (ref 70–99)
Glucose-Capillary: 115 mg/dL — ABNORMAL HIGH (ref 70–99)
Glucose-Capillary: 167 mg/dL — ABNORMAL HIGH (ref 70–99)
Glucose-Capillary: 216 mg/dL — ABNORMAL HIGH (ref 70–99)

## 2010-12-28 LAB — BASIC METABOLIC PANEL
BUN: 14 mg/dL (ref 6–23)
CO2: 29 mEq/L (ref 19–32)
Chloride: 103 mEq/L (ref 96–112)
Creatinine, Ser: 0.72 mg/dL (ref 0.4–1.2)
GFR calc Af Amer: >60 mL/min (ref >60)
Glucose, Bld: 186 mg/dL — ABNORMAL HIGH (ref 70–99)

## 2010-12-29 DIAGNOSIS — F312 Bipolar disorder, current episode manic severe with psychotic features: Secondary | ICD-10-CM

## 2010-12-29 LAB — GLUCOSE, CAPILLARY
Glucose-Capillary: 155 mg/dL — ABNORMAL HIGH (ref 70–99)
Glucose-Capillary: 225 mg/dL — ABNORMAL HIGH (ref 70–99)
Glucose-Capillary: 205 mg/dL — ABNORMAL HIGH (ref 70–99)

## 2010-12-30 DIAGNOSIS — F312 Bipolar disorder, current episode manic severe with psychotic features: Secondary | ICD-10-CM

## 2010-12-30 LAB — CBC
HCT: 37.7 % (ref 36.0–46.0)
MCV: 90.2 fL (ref 78.0–100.0)
RDW: 14.5 % (ref 11.5–15.5)
WBC: 6.8 10*3/uL (ref 4.0–10.5)

## 2010-12-30 LAB — GLUCOSE, CAPILLARY
Glucose-Capillary: 252 mg/dL — ABNORMAL HIGH (ref 70–99)
Glucose-Capillary: 164 mg/dL — ABNORMAL HIGH (ref 70–99)

## 2010-12-30 LAB — BASIC METABOLIC PANEL
BUN: 15 mg/dL (ref 6–23)
GFR calc non Af Amer: >60 mL/min (ref >60)
Glucose, Bld: 111 mg/dL — ABNORMAL HIGH (ref 70–99)
Potassium: 3.5 mEq/L (ref 3.5–5.1)

## 2010-12-31 DIAGNOSIS — F312 Bipolar disorder, current episode manic severe with psychotic features: Secondary | ICD-10-CM

## 2010-12-31 LAB — GLUCOSE, CAPILLARY
Glucose-Capillary: 174 mg/dL — ABNORMAL HIGH (ref 70–99)
Glucose-Capillary: 168 mg/dL — ABNORMAL HIGH (ref 70–99)

## 2011-01-01 DIAGNOSIS — F312 Bipolar disorder, current episode manic severe with psychotic features: Secondary | ICD-10-CM

## 2011-01-01 LAB — GLUCOSE, CAPILLARY: Glucose-Capillary: 96 mg/dL (ref 70–99)

## 2011-01-01 NOTE — Progress Notes (Signed)
NAMEANALY, [name]               ACCOUNT NO.:  [id number]  MEDICAL RECORD NO.:  [id number]           PATIENT TYPE:  I  LOCATION:  6741                         FACILITY:  [facility]  PHYSICIAN:  Rosanna Randy, MDDATE OF BIRTH:  [birth year]                                PROGRESS NOTE   Please notice that the patient have already a discharge summary dictated because the initial plan for this patient after controlling her blood pressure and diabetes was to be transferred to behavioral health hospital for further management of her psychiatry issue.  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  Current active medical problems include hypertension, diabetes, bipolar disorder, and insomnia. CURRENT MEDICATIONS: Include; 1. Klonopin. 2. Lovenox sliding scale insulin. 3. Lantus. 4. Labetalol. 5. Lisinopril. 6. Risperdal recently increased to 6 mg by mouth daily divided into 3     doses throughout the day. 7. Valproic acid 500 mg IV q.12 h. 8. The patient has p.r.n. artificial tears for dry eye. 9. Also using p.r.n. Zofran.  HISTORY OF PRESENT ILLNESS: The patient is a 45 year old female, who had a history of bipolar disorder who came into the hospital, confused, unable to provide any logical history.  The patient had stopped taking her medication about 2 weeks prior to being admitted.  On asking, why she had stopped the medication, the patient said that she was feeling happy, feeling good, and not in need of them.  Of note, the patient was taken to the psychiatrist that she normally follow with and he made changes on her psychiatry medication from her Abilify to [facility], but despite these changes, the patient was still  confused.  Her blood sugar was found to be high in the 600 range on the moment of admission.  The patient was originally brought to the emergency department 2 days ago when she was given some IV fluids and insulin and discharged back home at that moment just secondary to hyperglycemia.  The patient again is still over the last day, has been not getting well and at times she talks and sometimes she does not.  Her blood sugar was still found to be high.  The patient was back to the emergency department and in the emergency department, the patient was found to have a CBC about 400, but no BKA.  The patient had been admitted for uncontrolled diabetes and elevated hypertension. In addition, she was also found noncommunicative and having an exacerbation of her bipolar disorder/psychosis.  PERTINENT LABORATORY DATA: During this hospitalization, demonstrated an ammonia level of 9. Cardiac enzymes negative x3.  BMP 112, D-dimer 0.58, lipid profile with a triglyceride of 324, HDL 37, and LDL 73.  Negative urine pregnancy test.  Negative urinalysis.  Negative UDS.  TSH 2.56, free T4 of 1.28, hemoglobin A1c 13.1, alkaline phosphatase 79, AST 65, and ALT 27.  For reports of the procedures performed during this hospitalization, please refer to  discharge summary dictated on December 25, 2010.  CONSULTATIONS: Dr. Rogers Blocker has been consulted and is actively following this patient.  PHYSICAL EXAMINATION: At the moment of this dictation; VITAL SIGNS:  Temp 98.1, heart rate 83, respiratory rate 20, blood pressure 112/77, and oxygen saturation 97% on room air. GENERAL:  The patient in no acute distress. RESPIRATORY:  Clear to auscultation bilaterally. HEART:  Demonstrated regular rate and rhythm. ABDOMEN:  Obese, soft, nontender, and nondistended with positive bowel sound. EXTREMITIES:  Without edema, cyanosis, or clubbing. NEUROLOGIC:  Nonfocal.  The patient is more communicative now with a dramatic  improvement, taking care of herself regarding eating, changing, and ambulating on the hallways.  She does not have any active complaints.  LABORATORY DATA: On the date of the dictation, sodium 139, potassium 3.5, chloride 104, bicarb 29, BUN 15, creatinine 0.73, and blood sugar 111.  White blood cells 6.8, hemoglobin 11.8, and platelets 178.  ASSESSMENT AND PLAN: 1. Hypertension, pretty well controlled at this point with a blood     pressure ranging from 112-140.  Plan is to continue using labetalol     and lisinopril at the current doses with further adjustment as an     outpatient. 2. The patient has diabetes with a hemoglobin A1c of 30.1.  Plan is to     continue using metformin and Lantus.  She is in the hospital on     sliding scale insulin, which is going to be increased to moderate     today in order to calculate until the moment of discharge how many     more units we need to go up on her Lantus.  Further adjustment to     be done as an outpatient during followup. 3. Bipolar disorder and psychosis.  Currently, managed by     psychiatrist.  Plan is to continue using Klonopin, Risperdal, and     Depakote.  Dr. Rogers Blocker will continue making changes and providing     recommendations accordingly. 4. The patient's insomnia, which is also getting better with the use     of Depakote at night.     Rosanna Randy, MD     CEM/MEDQ  D:  12/30/2010  T:  12/30/2010  Job:  [id number]  Electronically Signed by Vassie Loll MD on 01/01/2011 04:40:49 PM

## 2011-01-04 LAB — DIFFERENTIAL
Basophils Relative: 0 % (ref 0–1)
Lymphocytes Relative: 30 % (ref 12–46)
Lymphs Abs: 1.9 10*3/uL (ref 0.7–4.0)
Monocytes Relative: 8 % (ref 3–12)
Neutro Abs: 3.7 10*3/uL (ref 1.7–7.7)
Neutrophils Relative %: 61 % (ref 43–77)

## 2011-01-04 LAB — URINALYSIS, ROUTINE W REFLEX MICROSCOPIC
Bilirubin Urine: NEGATIVE
Nitrite: NEGATIVE
Specific Gravity, Urine: 1.019 (ref 1.005–1.030)
pH: 7 (ref 5.0–8.0)

## 2011-01-04 LAB — POCT TOXICOLOGY PANEL

## 2011-01-04 LAB — CBC
RBC: 4.61 MIL/uL (ref 3.87–5.11)
RDW: 14.1 % (ref 11.5–15.5)
WBC: 6.2 10*3/uL (ref 4.0–10.5)

## 2011-01-04 LAB — COMPREHENSIVE METABOLIC PANEL
Alkaline Phosphatase: 115 U/L (ref 39–117)
BUN: 14 mg/dL (ref 6–23)
Calcium: 9.6 mg/dL (ref 8.4–10.5)
Creatinine, Ser: 0.7 mg/dL (ref 0.4–1.2)
Glucose, Bld: 93 mg/dL (ref 70–99)
Total Protein: 8.8 g/dL — ABNORMAL HIGH (ref 6.0–8.3)

## 2011-01-04 LAB — PREGNANCY, URINE: Preg Test, Ur: NEGATIVE

## 2011-01-04 LAB — ETHANOL: Alcohol, Ethyl (B): <5 mg/dL (ref 0–10)

## 2011-01-05 LAB — CBC
HCT: 34.8 % — ABNORMAL LOW (ref 36.0–46.0)
Hemoglobin: 10.1 g/dL — ABNORMAL LOW (ref 12.0–15.0)
Hemoglobin: 10.3 g/dL — ABNORMAL LOW (ref 12.0–15.0)
MCHC: 32.8 g/dL (ref 30.0–36.0)
MCHC: 32.8 g/dL (ref 30.0–36.0)
MCV: 83 fL (ref 78.0–100.0)
Platelets: 202 10*3/uL (ref 150–400)
Platelets: 219 10*3/uL (ref 150–400)
RBC: 3.78 MIL/uL — ABNORMAL LOW (ref 3.87–5.11)
RDW: 14.3 % (ref 11.5–15.5)
RDW: 14.8 % (ref 11.5–15.5)
WBC: 6.9 10*3/uL (ref 4.0–10.5)

## 2011-01-05 LAB — POCT CARDIAC MARKERS
CKMB, poc: <1 ng/mL — ABNORMAL LOW (ref 1.0–8.0)
Troponin i, poc: <0.05 ng/mL (ref 0.00–0.09)

## 2011-01-05 LAB — BASIC METABOLIC PANEL
BUN: 14 mg/dL (ref 6–23)
CO2: 31 mEq/L (ref 19–32)
Calcium: 8.9 mg/dL (ref 8.4–10.5)
Calcium: 9 mg/dL (ref 8.4–10.5)
Creatinine, Ser: 0.72 mg/dL (ref 0.4–1.2)
Creatinine, Ser: 0.77 mg/dL (ref 0.4–1.2)
GFR calc non Af Amer: >60 mL/min (ref >60)
GFR calc non Af Amer: >60 mL/min (ref >60)
Glucose, Bld: 137 mg/dL — ABNORMAL HIGH (ref 70–99)
Glucose, Bld: 90 mg/dL (ref 70–99)
Sodium: 140 mEq/L (ref 135–145)

## 2011-01-05 LAB — COMPREHENSIVE METABOLIC PANEL
BUN: 12 mg/dL (ref 6–23)
CO2: 28 mEq/L (ref 19–32)
Calcium: 9 mg/dL (ref 8.4–10.5)
Creatinine, Ser: 0.61 mg/dL (ref 0.4–1.2)
GFR calc non Af Amer: >60 mL/min (ref >60)
Glucose, Bld: 175 mg/dL — ABNORMAL HIGH (ref 70–99)

## 2011-01-05 LAB — LIPID PANEL
Cholesterol: 122 mg/dL (ref 0–200)
LDL Cholesterol: 73 mg/dL (ref 0–99)
Total CHOL/HDL Ratio: 3.5 RATIO
Triglycerides: 72 mg/dL (ref <150)
VLDL: 14 mg/dL (ref 0–40)

## 2011-01-05 LAB — GLUCOSE, CAPILLARY
Glucose-Capillary: 114 mg/dL — ABNORMAL HIGH (ref 70–99)
Glucose-Capillary: 124 mg/dL — ABNORMAL HIGH (ref 70–99)
Glucose-Capillary: 141 mg/dL — ABNORMAL HIGH (ref 70–99)
Glucose-Capillary: 166 mg/dL — ABNORMAL HIGH (ref 70–99)
Glucose-Capillary: 114 mg/dL — ABNORMAL HIGH (ref 70–99)
Glucose-Capillary: 116 mg/dL — ABNORMAL HIGH (ref 70–99)
Glucose-Capillary: 151 mg/dL — ABNORMAL HIGH (ref 70–99)
Glucose-Capillary: 152 mg/dL — ABNORMAL HIGH (ref 70–99)
Glucose-Capillary: 66 mg/dL — ABNORMAL LOW (ref 70–99)

## 2011-01-05 LAB — BRAIN NATRIURETIC PEPTIDE: Pro B Natriuretic peptide (BNP): 955 pg/mL — ABNORMAL HIGH (ref 0.0–100.0)

## 2011-01-05 LAB — RETICULOCYTES: Retic Ct Pct: 1.9 % (ref 0.4–3.1)

## 2011-01-05 LAB — IRON AND TIBC: Saturation Ratios: 7 % — ABNORMAL LOW (ref 20–55)

## 2011-01-05 LAB — CARDIAC PANEL(CRET KIN+CKTOT+MB+TROPI)
CK, MB: 0.4 ng/mL (ref 0.3–4.0)
CK, MB: 0.7 ng/mL (ref 0.3–4.0)
Relative Index: INVALID (ref 0.0–2.5)
Relative Index: INVALID (ref 0.0–2.5)
Total CK: 19 U/L (ref 7–177)
Total CK: 25 U/L (ref 7–177)
Total CK: 27 U/L (ref 7–177)
Troponin I: 0.02 ng/mL (ref 0.00–0.06)

## 2011-01-05 LAB — URINALYSIS, ROUTINE W REFLEX MICROSCOPIC
Glucose, UA: NEGATIVE mg/dL
Hgb urine dipstick: NEGATIVE
Protein, ur: 30 mg/dL — AB

## 2011-01-05 LAB — FERRITIN: Ferritin: 52 ng/mL (ref 10–291)

## 2011-01-05 LAB — DIFFERENTIAL
Eosinophils Absolute: 0.1 10*3/uL (ref 0.0–0.7)
Lymphocytes Relative: 20 % (ref 12–46)
Lymphs Abs: 1.3 10*3/uL (ref 0.7–4.0)
Neutrophils Relative %: 75 % (ref 43–77)

## 2011-01-05 LAB — RAPID URINE DRUG SCREEN, HOSP PERFORMED
Barbiturates: NOT DETECTED
Benzodiazepines: NOT DETECTED

## 2011-01-05 LAB — URINE MICROSCOPIC-ADD ON

## 2011-01-05 LAB — T4, FREE: Free T4: 0.98 ng/dL (ref 0.80–1.80)

## 2011-01-05 LAB — FOLATE: Folate: 14.1 ng/mL

## 2011-01-05 NOTE — Consult Note (Signed)
  NAMEKIRSTI, [name]               ACCOUNT NO.:  [id number]  MEDICAL RECORD NO.:  [id number]           PATIENT TYPE:  I  LOCATION:  6741                         FACILITY:  [facility]  PHYSICIAN:  Eulogio Ditch, MD DATE OF BIRTH:  08/26/1966  DATE OF CONSULTATION:  01/01/2011 DATE OF DISCHARGE:  01/01/2011                                CONSULTATION   HISTORY OF PRESENT ILLNESS:  [name] with history of bipolar disorder, who was admitted on the medical floor, as she was confused.  The patient has a history of bipolar disorder, but stopped taking her medication.  She reported that she was feeling happy and good.  The patient who was on Latuda and Depakote at the time of admission.  During the hospital stay, the patient was started on Risperdal and slowly titrated to 4 mg at bedtime along with Depakote 1000 mg p.o. daily.  The patient responded to the medication well. Initially, she was very disorganized in a catatonic phase, but then later on she started talking and was able to provide good history.  The patient's family was very much involved in her treatment and they reported that she has gone to her baseline.  The patient was also started on Klonopin during the hospital stay 2 mg at bedtime. Initially, the patient was not sleeping well, but on Risperdal and clonazepam.  The patient was sleeping much better.  On January 01, 2011, as the patient was psychiatrically stable, and the patient has a good family support, the patient was discharged to follow up in the outpatient setting.  At the time of discharge, the patient denied any suicidal or homicidal ideations, was not having any acute psychotic/manic symptoms.  DISCHARGE DIAGNOSES:  Bipolar disorder, manic-type under remission. Rule out schizoaffective disorder mixed type.     Eulogio Ditch, MD     SA/MEDQ  D:  01/02/2011  T:  01/02/2011  Job:  [id number]  Electronically Signed by Eulogio Ditch  on  01/05/2011 11:50:08 AM

## 2011-01-06 LAB — GLUCOSE, CAPILLARY
Glucose-Capillary: 146 mg/dL — ABNORMAL HIGH (ref 70–99)
Glucose-Capillary: 153 mg/dL — ABNORMAL HIGH (ref 70–99)
Glucose-Capillary: 167 mg/dL — ABNORMAL HIGH (ref 70–99)
Glucose-Capillary: 179 mg/dL — ABNORMAL HIGH (ref 70–99)
Glucose-Capillary: 200 mg/dL — ABNORMAL HIGH (ref 70–99)
Glucose-Capillary: 206 mg/dL — ABNORMAL HIGH (ref 70–99)

## 2011-01-06 LAB — POCT I-STAT, CHEM 8
Chloride: 102 mEq/L (ref 96–112)
Glucose, Bld: 171 mg/dL — ABNORMAL HIGH (ref 70–99)
HCT: 41 % (ref 36.0–46.0)
Hemoglobin: 13.9 g/dL (ref 12.0–15.0)
Potassium: 3.5 mEq/L (ref 3.5–5.1)
Sodium: 139 mEq/L (ref 135–145)

## 2011-01-06 LAB — URINE MICROSCOPIC-ADD ON

## 2011-01-06 LAB — CBC
MCHC: 33.1 g/dL (ref 30.0–36.0)
MCV: 84.9 fL (ref 78.0–100.0)
Platelets: 191 10*3/uL (ref 150–400)
RBC: 4.59 MIL/uL (ref 3.87–5.11)
WBC: 7.7 10*3/uL (ref 4.0–10.5)

## 2011-01-06 LAB — DIFFERENTIAL
Basophils Relative: 1 % (ref 0–1)
Eosinophils Absolute: 0 10*3/uL (ref 0.0–0.7)
Lymphs Abs: 2.2 10*3/uL (ref 0.7–4.0)
Monocytes Relative: 9 % (ref 3–12)
Neutro Abs: 4.7 10*3/uL (ref 1.7–7.7)
Neutrophils Relative %: 62 % (ref 43–77)

## 2011-01-06 LAB — POCT CARDIAC MARKERS
CKMB, poc: <1 ng/mL — ABNORMAL LOW (ref 1.0–8.0)
Myoglobin, poc: 61.9 ng/mL (ref 12–200)

## 2011-01-06 LAB — RAPID URINE DRUG SCREEN, HOSP PERFORMED
Barbiturates: NOT DETECTED
Opiates: NOT DETECTED

## 2011-01-06 LAB — URINALYSIS, ROUTINE W REFLEX MICROSCOPIC
Bilirubin Urine: NEGATIVE
Glucose, UA: NEGATIVE mg/dL
Hgb urine dipstick: NEGATIVE
Specific Gravity, Urine: 1.016 (ref 1.005–1.030)
pH: 7.5 (ref 5.0–8.0)

## 2011-01-06 LAB — URINE CULTURE: Colony Count: >=100000

## 2011-01-07 LAB — HEPATIC FUNCTION PANEL
ALT: 17 U/L (ref 0–35)
Albumin: 3.6 g/dL (ref 3.5–5.2)
Indirect Bilirubin: 0.3 mg/dL (ref 0.3–0.9)
Total Protein: 6.9 g/dL (ref 6.0–8.3)

## 2011-01-07 LAB — COMPREHENSIVE METABOLIC PANEL
Albumin: 3.5 g/dL (ref 3.5–5.2)
Alkaline Phosphatase: 82 U/L (ref 39–117)
BUN: 9 mg/dL (ref 6–23)
CO2: 30 mEq/L (ref 19–32)
Chloride: 102 mEq/L (ref 96–112)
Creatinine, Ser: 0.56 mg/dL (ref 0.4–1.2)
GFR calc non Af Amer: >60 mL/min (ref >60)
Glucose, Bld: 96 mg/dL (ref 70–99)
Potassium: 3.6 mEq/L (ref 3.5–5.1)
Total Bilirubin: 0.8 mg/dL (ref 0.3–1.2)

## 2011-01-07 LAB — CBC
HCT: 41.4 % (ref 36.0–46.0)
HCT: 39.3 % (ref 36.0–46.0)
Hemoglobin: 12.8 g/dL (ref 12.0–15.0)
MCV: 87.5 fL (ref 78.0–100.0)
Platelets: 213 10*3/uL (ref 150–400)
RBC: 4.49 MIL/uL (ref 3.87–5.11)
RDW: 14.3 % (ref 11.5–15.5)
WBC: 6.5 10*3/uL (ref 4.0–10.5)
WBC: 7.6 10*3/uL (ref 4.0–10.5)

## 2011-01-07 LAB — POCT I-STAT, CHEM 8
BUN: 9 mg/dL (ref 6–23)
Calcium, Ion: 1.16 mmol/L (ref 1.12–1.32)
Chloride: 101 mEq/L (ref 96–112)
HCT: 43 % (ref 36.0–46.0)
Potassium: 3.9 mEq/L (ref 3.5–5.1)

## 2011-01-07 LAB — DIFFERENTIAL
Basophils Absolute: 0 10*3/uL (ref 0.0–0.1)
Basophils Absolute: 0 10*3/uL (ref 0.0–0.1)
Basophils Relative: 1 % (ref 0–1)
Eosinophils Relative: 2 % (ref 0–5)
Lymphocytes Relative: 25 % (ref 12–46)
Lymphocytes Relative: 24 % (ref 12–46)
Monocytes Absolute: 0.7 10*3/uL (ref 0.1–1.0)
Neutro Abs: 4.2 10*3/uL (ref 1.7–7.7)
Neutro Abs: 4.8 10*3/uL (ref 1.7–7.7)
Neutrophils Relative %: 64 % (ref 43–77)
Neutrophils Relative %: 63 % (ref 43–77)

## 2011-01-10 NOTE — Discharge Summary (Signed)
[name], [name]               ACCOUNT NO.:  [id number]  MEDICAL RECORD NO.:  [id number]           PATIENT TYPE:  I  LOCATION:  6741                         FACILITY:  [facility]  PHYSICIAN:  Calvert Cantor, M.D.     DATE OF BIRTH:  [birth year]  DATE OF ADMISSION:  12/21/2010 DATE OF DISCHARGE:  01/01/2011                              DISCHARGE SUMMARY   PRIMARY CARE PHYSICIAN:  [facility].  PRESENTING COMPLAINT:  Brought in for altered mental status.  DISCHARGE DIAGNOSES: 1. Bipolar disorder with psychosis. 2. Uncontrolled diabetes mellitus with a hemoglobin A1c of 13.1. 3. Uncontrolled hypertension. 4. Diastolic congestive heart failure.  2-D echo in 2010 revealed an     ejection fraction of 60-65%. 5. Microcytic anemia thought to be secondary to menorrhagia. 6. Hypertriglyceridemia. 7. Morbid obesity.  DISCHARGE MEDICATIONS:  New medications: 1. Lantus 32 units at bedtime. 2. NovoLog 4 units subcutaneously three times a day with meals. 3. Clonazepam 2 mg at bedtime. 4. Labetalol 200 mg twice a day. 5. Risperdal 4 mg daily at bedtime.  The patient is to continue the following medications that she was taking at home. 1. Aspirin 81 mg daily. 2. Fosinopril 40 mg daily. 3. Metformin 1000 mg twice a day. 4. Depakote ER 500 mg 2 tablets daily at bedtime.  Please see discharge summary dictated on December 25, 2010, by Dr. Gwenlyn Perking, job number [id number] and also see progress note dictated on December 30, 2010, by Dr. Gwenlyn Perking, job number [id number].  HOSPITAL COURSE:  This is a 45 year old female who was admitted with altered mental status and treated for psychosis secondary to bipolar disorder by Dr. Rogers Blocker.  She has been evaluated by Dr. Rogers Blocker today and it has been decided that she is safe to go home.  She will be living with her family at home.  It was initially thought that she may need to go to behavioral medicine, but the plan has been changed.  She does have a psychiatrist that  she follows as an outpatient and she is to continue following with the psychiatrist.  Apparently, she was going to the [facility] and seeing [employer].  Of concern also was her high blood pressure and her diabetes.  For her high blood pressure labetalol has been added.  Blood pressure today was 153/95 and 141/84.  She needs to have these medications further titrated as an outpatient.  Regarding her diabetes, hemoglobin A1c was elevated as mentioned above. She has been started on Lantus and NovoLog, and blood sugars have been quite stable in the 90s and 100s.  The patient does have hypertriglyceridemia.  It is thought that better controlling her diabetes should improve this.  I have also requested that she start a low-fat diet.  Follow up lipid profile should be done in a few months.  PERTINENT BLOOD WORK:  Hemoglobin was 11.8 with a hematocrit of 37.7 when last checked on April 3.  Depakote level was 31.1.  According to Dr. Rogers Blocker her Depakote can be titrated up if needed as an outpatient.  Thyroid studies performed on March 25 including TSH, free T4 and T3 were  within normal limits.  Lipid profile revealed triglyceride level of 324, cholesterol was 175, LDL 73, HDL 37.  PHYSICAL EXAM:  GENERAL:  Today the patient is awake, alert, oriented x3. LUNGS:  Clear bilaterally. HEART:  Regular rate and rhythm.  No murmurs. ABDOMEN:  Obese, soft, nontender, nondistended.  Bowel sounds positive. EXTREMITIES:  No cyanosis, clubbing or edema. PSYCHOLOGIC:  She still has a flat affect, but she is appropriate as far as orientation and current situation.  She is answering questions appropriately.  The patient has expressed desire to go back home and states that she has a good family support system.  She has a brother whom she will be living with, who is a physician, and will be injecting her insulin for her.  He has been taught and monitored by our nursing staff here in the  hospital.  CONDITION ON DISCHARGE:  Stable.  FOLLOWUP INSTRUCTIONS:  The patient has an appointment with Dr. Sherryll Burger at [facility] on January 13, 2011.  She needs to follow up with her psychiatrist in 1-2 weeks as well.  Time on discharge was 60 minutes.     Calvert Cantor, M.D.     SR/MEDQ  D:  01/01/2011  T:  01/02/2011  Job:  [id number]  cc:   Dr. Clelia [name]  Electronically Signed by Calvert Cantor M.D. on 01/10/2011 04:09:23 PM

## 2011-02-02 ENCOUNTER — Emergency Department (HOSPITAL_COMMUNITY): Payer: Self-pay

## 2011-02-02 ENCOUNTER — Inpatient Hospital Stay (HOSPITAL_COMMUNITY)
Admission: EM | Admit: 2011-02-02 | Discharge: 2011-02-05 | DRG: 293 | Disposition: A | Discharge status: Home / Self Care | Payer: Self-pay | Attending: Internal Medicine | Admitting: Internal Medicine

## 2011-02-02 DIAGNOSIS — Z794 Long term (current) use of insulin: Secondary | ICD-10-CM

## 2011-02-02 DIAGNOSIS — I509 Heart failure, unspecified: Secondary | ICD-10-CM | POA: Diagnosis present

## 2011-02-02 DIAGNOSIS — F064 Anxiety disorder due to known physiological condition: Secondary | ICD-10-CM | POA: Diagnosis present

## 2011-02-02 DIAGNOSIS — IMO0001 Reserved for inherently not codable concepts without codable children: Secondary | ICD-10-CM | POA: Diagnosis present

## 2011-02-02 DIAGNOSIS — I10 Essential (primary) hypertension: Secondary | ICD-10-CM | POA: Diagnosis present

## 2011-02-02 DIAGNOSIS — Z823 Family history of stroke: Secondary | ICD-10-CM

## 2011-02-02 DIAGNOSIS — Z8249 Family history of ischemic heart disease and other diseases of the circulatory system: Secondary | ICD-10-CM

## 2011-02-02 DIAGNOSIS — D649 Anemia, unspecified: Secondary | ICD-10-CM | POA: Diagnosis present

## 2011-02-02 DIAGNOSIS — I5033 Acute on chronic diastolic (congestive) heart failure: Principal | ICD-10-CM | POA: Diagnosis present

## 2011-02-02 DIAGNOSIS — F319 Bipolar disorder, unspecified: Secondary | ICD-10-CM | POA: Diagnosis present

## 2011-02-02 DIAGNOSIS — E785 Hyperlipidemia, unspecified: Secondary | ICD-10-CM | POA: Diagnosis present

## 2011-02-02 DIAGNOSIS — Z7982 Long term (current) use of aspirin: Secondary | ICD-10-CM

## 2011-02-02 LAB — COMPREHENSIVE METABOLIC PANEL
ALT: 8 U/L (ref 0–35)
AST: 18 U/L (ref 0–37)
Alkaline Phosphatase: 82 U/L (ref 39–117)
Calcium: 9.5 mg/dL (ref 8.4–10.5)
GFR calc Af Amer: >60 mL/min (ref >60)
Potassium: 3.7 mEq/L (ref 3.5–5.1)
Sodium: 140 mEq/L (ref 135–145)
Total Protein: 7 g/dL (ref 6.0–8.3)

## 2011-02-02 LAB — CK TOTAL AND CKMB (NOT AT ARMC)
CK, MB: 2.3 ng/mL (ref 0.3–4.0)
Relative Index: INVALID (ref 0.0–2.5)
Total CK: 58 U/L (ref 7–177)

## 2011-02-02 LAB — POCT CARDIAC MARKERS: Troponin i, poc: <0.05 ng/mL (ref 0.00–0.09)

## 2011-02-02 LAB — DIFFERENTIAL
Basophils Absolute: 0 10*3/uL (ref 0.0–0.1)
Eosinophils Absolute: 0.3 10*3/uL (ref 0.0–0.7)
Eosinophils Relative: 4 % (ref 0–5)
Lymphocytes Relative: 25 % (ref 12–46)
Lymphs Abs: 2 10*3/uL (ref 0.7–4.0)
Neutrophils Relative %: 65 % (ref 43–77)

## 2011-02-02 LAB — CBC
HCT: 33.1 % — ABNORMAL LOW (ref 36.0–46.0)
Platelets: 275 10*3/uL (ref 150–400)
RBC: 3.78 MIL/uL — ABNORMAL LOW (ref 3.87–5.11)
RDW: 14.4 % (ref 11.5–15.5)
WBC: 7.8 10*3/uL (ref 4.0–10.5)

## 2011-02-02 LAB — TROPONIN I: Troponin I: <0.3 ng/mL (ref <0.30)

## 2011-02-03 LAB — PHOSPHORUS: Phosphorus: 4.4 mg/dL (ref 2.3–4.6)

## 2011-02-03 LAB — GLUCOSE, CAPILLARY
Glucose-Capillary: 151 mg/dL — ABNORMAL HIGH (ref 70–99)
Glucose-Capillary: 157 mg/dL — ABNORMAL HIGH (ref 70–99)
Glucose-Capillary: 158 mg/dL — ABNORMAL HIGH (ref 70–99)

## 2011-02-03 LAB — CARDIAC PANEL(CRET KIN+CKTOT+MB+TROPI): Troponin I: <0.3 ng/mL (ref <0.30)

## 2011-02-03 LAB — TSH: TSH: 5.479 u[IU]/mL — ABNORMAL HIGH (ref 0.350–4.500)

## 2011-02-03 LAB — MAGNESIUM: Magnesium: 2.1 mg/dL (ref 1.5–2.5)

## 2011-02-03 LAB — HEMOGLOBIN A1C: Hgb A1c MFr Bld: 9.5 % — ABNORMAL HIGH (ref <5.7)

## 2011-02-04 DIAGNOSIS — I509 Heart failure, unspecified: Secondary | ICD-10-CM

## 2011-02-04 DIAGNOSIS — M7989 Other specified soft tissue disorders: Secondary | ICD-10-CM

## 2011-02-04 LAB — PRO B NATRIURETIC PEPTIDE: Pro B Natriuretic peptide (BNP): 452.1 pg/mL — ABNORMAL HIGH (ref 0–125)

## 2011-02-04 LAB — LIPID PANEL
Cholesterol: 141 mg/dL (ref 0–200)
HDL: 37 mg/dL — ABNORMAL LOW (ref >39)
Triglycerides: 176 mg/dL — ABNORMAL HIGH (ref <150)
VLDL: 35 mg/dL (ref 0–40)

## 2011-02-04 LAB — BASIC METABOLIC PANEL
CO2: 28 mEq/L (ref 19–32)
Calcium: 9.7 mg/dL (ref 8.4–10.5)
GFR calc Af Amer: >60 mL/min (ref >60)
Potassium: 3.7 mEq/L (ref 3.5–5.1)
Sodium: 138 mEq/L (ref 135–145)

## 2011-02-04 LAB — GLUCOSE, CAPILLARY
Glucose-Capillary: 132 mg/dL — ABNORMAL HIGH (ref 70–99)
Glucose-Capillary: 127 mg/dL — ABNORMAL HIGH (ref 70–99)

## 2011-02-04 LAB — T4, FREE: Free T4: 0.92 ng/dL (ref 0.80–1.80)

## 2011-02-05 LAB — BASIC METABOLIC PANEL
CO2: 31 mEq/L (ref 19–32)
Chloride: 99 mEq/L (ref 96–112)
Glucose, Bld: 114 mg/dL — ABNORMAL HIGH (ref 70–99)
Potassium: 3.5 mEq/L (ref 3.5–5.1)
Sodium: 138 mEq/L (ref 135–145)

## 2011-02-05 LAB — CBC
HCT: 33.5 % — ABNORMAL LOW (ref 36.0–46.0)
Hemoglobin: 10.6 g/dL — ABNORMAL LOW (ref 12.0–15.0)
WBC: 6.4 10*3/uL (ref 4.0–10.5)

## 2011-02-05 LAB — DIFFERENTIAL
Basophils Absolute: 0 10*3/uL (ref 0.0–0.1)
Lymphocytes Relative: 26 % (ref 12–46)
Monocytes Absolute: 0.5 10*3/uL (ref 0.1–1.0)
Neutro Abs: 4.1 10*3/uL (ref 1.7–7.7)

## 2011-02-05 LAB — GLUCOSE, CAPILLARY
Glucose-Capillary: 106 mg/dL — ABNORMAL HIGH (ref 70–99)
Glucose-Capillary: 125 mg/dL — ABNORMAL HIGH (ref 70–99)

## 2011-02-07 NOTE — H&P (Signed)
[name], [name]               ACCOUNT NO.:  [id number]  MEDICAL RECORD NO.:  [id number]           PATIENT TYPE:  E  LOCATION:  [facility]                         FACILITY:  [facility]  PHYSICIAN:  Eduard Clos, MDDATE OF BIRTH:  1965/11/27  DATE OF ADMISSION:  12/21/2010 DATE OF DISCHARGE:                             HISTORY & PHYSICAL   PRIMARY CARE PHYSICIAN:  [facility].  CHIEF COMPLAINT:  Altered mental status.  History obtained from the patient's brother and sister-in-law  HISTORY OF PRESENT ILLNESS:  [name] with known history of bipolar disorder, psychosis, CHF, diabetes, hypertension, obesity, and has been getting more confused and speaking to herself the last 7 days. The patient's family states that her mother had gone out of town to another country to attend to the patient's sister's delivery, and the patient's brother had come from [place] to take care of the patient.  He was in constant touch with the patient and after he reached here, the patient started getting more confused and talking to herself, not herself.  She was taken to the psychiatrist who had changed her Abilify to [place] despite which she was still confused.  Her blood sugars were found to be high in the 600 range.  She was originally brought to the ER 2 days ago when she was given some IV fluids and insulin and discharged back home.  The patient again still over the last 2 days has been not getting well.  At times she talks and sometimes she does not.  Her blood sugar was still found to be high.  She was brought to the ER again.  In the ER, the patient was found with CBG in the 400 range but not in DKA. The patient has been admitted for her uncontrolled diabetes, accelerated hypertension.  In addition, at this time, the patient still is confused and minimally communicative.  There is no mention of any chest pain, shortness of breath, any nausea, vomiting, abdominal pain, dysuria,  discharge, diarrhea, any focal deficit.  The patient at this time does not talk but when asked to move a limb she does  PAST MEDICAL HISTORY: 1. History of bipolar disorder. 2. Psychosis. 3. Hypertension. 4. CHF.  As per chart it is also mentioned MI, anxiety, anemia.  Last 2-D echo done was in August 2010, EF of 60-65%.  MEDICATIONS PER ADMISSION:  Aspirin, Fosinopril, Latuda, metformin, torsemide, doses are not known, which has to be verified and continued if appropriate.  ALLERGIES: 1. ACETAMINOPHEN 2. CAFFEINE.  FAMILY HISTORY:  Negative any bipolar disorder or psychosis, is positive for diabetes.  SOCIAL HISTORY:  The patient lives with her mother who is out of town, is in [place].  Does not smoke cigarettes, drink alcohol, or use illegal drugs.  Presently, her brother and sister-in-law is staying with her, who has come from [place].  REVIEW OF SYSTEMS:  As present in history of present illness.  Nothing else significant.  PHYSICAL EXAMINATION:  GENERAL:  The patient examined at bedside, not in acute distress. VITAL SIGNS:  Blood pressure 168/102.  When the patient arrived it was 190/102, pulse is  114 per minute, temperature 98.7, respirations 18 per minute, O2 sat 98%. HEENT:  Anicteric.  No pallor.  No facial asymmetry.  Tongue is midline. NECK:  No JVD. CHEST:  Bilateral air entry present.  No rhonchi.  No crepitation. HEART:  S1, S2 heard. ABDOMEN:  Soft, nontender.  Bowel sounds heard. CNS: The patient is alert, awake, is not communicative.  Moves upper and lower extremities. EXTREMITIES:  Peripheral pulses felt.  No edema.  LABORATORY DATA:  EKG has been ordered.  Cardiac enzymes have been ordered.  BNP, D-dimer has been ordered.  Ammonia level.  A CT head has been done just now, results are still pending.  Also, ordered LFT.  Labs are available at this time.  A VBG shows a pH of 7.46.  CBC showed WBC of 9.3, hemoglobin 14.6, hematocrit is 43, platelets  225.  Basic metabolic panel, sodium 139 potassium 3.6, chloride 98, glucose 445, BUN 14, creatinine 1, valproic acid less than 10.  Chest x-ray shows cardiomegaly with interstitial pulmonary edema.  ASSESSMENT: 1. Altered mental status from psychosis and history of bipolar     disorder. 2. Diabetes mellitus type 2, uncontrolled, not in diabetic     ketoacidosis. 3. Hypertension aspirated. 4. History of congestive heart failure, diastolic, last EF measured     was in August 2010, EF of 60-65%. 5. Obesity. 6. History of anxiety.  PLAN: 1. At this time, we will admit the patient to telemetry. 2. For her psychosis at this time, we placed the patient on p.r.n. IV     Haldol.  We need to get a psychiatric consult in a.m. and follow     their recommendations.  We need to verify her home medications. 3. Uncontrolled diabetes mellitus type 2.  I am repeating a BMET at     this time.  Check anion gap.  We will place the patient on CBG     checks with sensitive sliding scale until the patient can reliably     eat, the patient's family stated that she has hardly eaten for the     last 1 week, after we can change to a.c. and at bedtime. 4. Accelerated hypertension.  I am placing the patient on p.r.n. IV     labetalol.  We need to restart her antihypertensives once the     patient can reliably take p.o. 5. History of congestive heart failure.  I am going to check a BNP     level.  Chest x-ray shows interstitial pulmonary edema.  The     patient has received 2 L bolus by the ER physician.  At this time,     I am going to stop     the fluids and keep it KVO.  We will keep the patient on continuous     pulse ox.  I am going to cycle cardiac markers, D-dimer also. 6. Further recommendation based on the test orders and clinical     course.     Eduard Clos, MD     ANK/MEDQ  D:  12/21/2010  T:  12/21/2010  Job:  [id number]  Electronically Signed by Midge Minium MD on 02/07/2011  07:48:00 PM

## 2011-02-07 NOTE — H&P (Signed)
  [name], [name]               ACCOUNT NO.:  [id number]  MEDICAL RECORD NO.:  [id number]           PATIENT TYPE:  I  LOCATION:  6741                         FACILITY:  [facility]  PHYSICIAN:  Eduard Clos, MDDATE OF BIRTH:  1966-04-14  DATE OF ADMISSION:  12/21/2010 DATE OF DISCHARGE:                             HISTORY & PHYSICAL   ADDENDUM  The patient's EKG was done.  It did show some ST-T changes in the anterior lead with Q-waves which were concerning.  I did discuss this EKG with Dr. Riley Kill, cardiologist who at this time stated that it would be concerning and also could be a lead placement issue, for which I did discuss with Dr. Barbara Cower the plan who was taking care of the patient for a repeat EKG to be done, which he is going to do and after that, further recommendation based on the new EKG.     Eduard Clos, MD     ANK/MEDQ  D:  12/22/2010  T:  12/22/2010  Job:  [id number]  Electronically Signed by Midge Minium MD on 02/07/2011 07:48:20 PM

## 2011-02-07 NOTE — H&P (Signed)
  NAMECOILA, WARDELL               ACCOUNT NO.:  [id number]  MEDICAL RECORD NO.:  [id number]           PATIENT TYPE:  E  LOCATION:  [facility]                         [facility]ACILITY[facility]  [facility]  PHYSICIAN:  Eduard Clos, MDDATE OF BIRTH:  May 24, 1966  DATE OF ADMISSION:  12/21/2010 DATE OF DISCHARGE:                             HISTORY & PHYSICAL   ADDENDUM:  In addition, I am also ordering a stat TSH, free T4 and free T3.  A previous H and P states that she had a history of cytotoxicosis but the patient is well, it was found to be normal.     Eduard Clos, MD     ANK/MEDQ  D:  12/21/2010  T:  12/21/2010  Job:  [id number]  Electronically Signed by Midge Minium MD on 02/07/2011 07:48:15 PM

## 2011-02-10 NOTE — Discharge Summary (Signed)
[name], [name]               ACCOUNT NO.:  [id number]   MEDICAL RECORD NO.:  [id number]          PATIENT TYPE:  INP   LOCATION:  4703                         FACILITY:  [facility]   PHYSICIAN:  Mick Sell, MD DATE OF BIRTH:  Feb 11, 1966   DATE OF ADMISSION:  03/22/2009  DATE OF DISCHARGE:  03/24/2009                               DISCHARGE SUMMARY   DISCHARGE DIAGNOSES:  1. Congestive heart failure exacerbation, resolved on discharge.  2. Anemia - chronic, stable since 2007, iron deficiency anemia.  3. Diabetes type 2, hemoglobin A1C March 22, 2009 = 7.8.  4. Remote history of thyrotoxicosis - TSH on March 22, 2009 = 3.465.  5. Bipolar affective disorder most recently manic with psychotic      features on March 03, 2009.  6. Hypertension.  7. History of urinary tract infection, polymicrobial.  8. Obesity.  9. 2D ECHO in 2007 - normal LV function, EF 70%, high left ventricular      filling pressure, mild mitral regurgitation, left atrium moderately      dilated.   DISCHARGE MEDICATIONS:  1. Abilify 15 mg tablet once daily.  2. Klor-Con 10 mEq everyday.  3. Glyburide 5 mg tablet daily.  4. Trazodone 100 mg tablet take two tablets at nighttime.  5. Lisinopril 40 mg tablet daily.  6. Lasix 80 mg three times daily.  7. Stop metoprolol.   DISPOSITION:  The patient was discharged from the hospital in stable  condition diuresed properly with Lasix 80 mg t.i.d. dosing.  Weight on  admission 288, weight on discharge 282 pounds.  Due to bradycardia with  heart rate in 20s to 30s, metoprolol was held during the  hospitalization.  We will defer decision to primary care [name] to  restart metoprolol as indicated.  The patient would benefit from beta-  blocker considering her CHF.  In addition Lasix dose was increased from  80 mg daily to 80 mg three times daily.  The patient had diuresed with  dosing well during hospitalization and will defer to primary care  [name], Dr. Reche [name] to  readjust the regimen lower the dosage of Lasix  if indicated.  The patient will follow up with Dr. Reche [name] at [facility] in July 2010.  On follow-up appointment it will be important to  set up cardiology follow-up since the patient has not had cardiac  evaluation prior to these hospitalization except two-dimensional  echocardiogram in 2007.   CONSULTATIONS:  None.   PROCEDURE:  March 22, 2009 CXR - mild cardiomegaly, pulmonary vascular  congestion, wet pleural fissures, interstitial edema, CHF.   HISTORY OF PRESENT ILLNESS:  [name] with hypertension,  diabetes, medial noncompliance, bipolar affective disorder with  psychotic features in June 2010 who presents with worsening shortness of  breath over the past 4-5 days, started about 10 days ago.  Associated  with productive cough with greenish sputum, fever, chills, four-pillow  orthopnea, lower chest area pain that is aggravated by breathing and  talking.  She has had similar pain 3 years ago, but not as severe.  No  specific alleviating  factors.  Denies sick contacts or exposure.  No  abdominal or urinary concerns.  Reports recent traveling to [place]  and believes problems started after her return.  Also reports lower  extremity joint pain one week in duration, lower extremity swelling.   ADMISSION PHYSICAL EXAMINATION:  VITAL SIGNS:  Temperature 97.5, pulse  64, blood pressure 148/90, respirations 20, saturating 96% on room air.  Weight 288 pounds.  Height 5 feet 7 inches.   GENERAL:  Alert, well-developed, cooperative to examination.  HEENT:  Normocephalic.  PERRLA.  EOMI.  Anicteric sclerae.  No  conjunctival pallor.  Oropharynx pink and moist.  No erythema and no  exudates.  NECK:  Supple.  Full range of motion.  No JVD or carotid bruits.  LUNGS:  Normal respiratory effort.  Good air movement bilaterally.  Mild  crackles at bases.  No tachypnea.  HEART:  Normal rate and regular rhythm.  No murmurs,  rubs, or gallops.  ABDOMEN:  Soft, obese.  Mild epigastric tenderness.  No rebound or  guarding.  MUSCULOSKELETAL:  No joint swelling or joint warmth.  No redness over  joints.  Pulses +2 bilaterally DP and PT.  EXTREMITIES:  +2 bilateral lower extremity edema.  No cyanosis.  NEUROLOGY:  Alert and oriented x3.  Overall nonfocal.  PSYCH:  Appropriate.  No SI or HI.   LABORATORY DATA:  WBC 6.6, hemoglobin 10.3, MCV 83, platelets 179.  Point of care markers x1 negative.  Sodium 139, potassium 4.0, chloride 107, bicarb 28, glucose 175, BUN 12,  creatinine 0.61.  Bilirubin 0.4.  LFTs within normal limits.  BNP 955.  Urinalysis positive for moderate leukocytes and proteinuria 30, white  blood cells 7-10.   HOSPITAL COURSE:  1. Congestive heart failure noted on chest x-ray and consistent with      reported symptoms in addition to elevated BNP of 955.  Last BNP in      2007 was 423.  The patient admitted to telemetry, diuresed with      Lasix 80 mg t.i.d. dosing.  She has diuresed appropriately.  No      electrolyte abnormalities during the hospitalization.  The patient      has lost a total of 6 pounds with diuresis process.  We will defer      to primary care [name] to change dosing on Lasix.  On chest x-      ray, no signs of pneumonia.  The patient has remained afebrile.  No      leukocytosis and productive cough has resolved.  Acute coronary      syndrome ruled out.  Cardiac enzymes x3 negative.  FLPs within      normal limits.  A 12-lead EKG shows poor R wave progression.      Otherwise normal sinus rhythm at 70 beats per minute.  Normal axis.      No acute ST-T wave abnormality.  TSH and free T4 within normal      limits.  UDS screen negative.   1. Anemia, chronic and stable.  Anemia panel on March 22, 2009; iron      27, TIBC 368, vitamin B12 243, ferritin 52, folate 14.1.   1. Diabetes.  Hemoglobin A1C on March 22, 2009, was 7.  The patient      only on glyburide 5 mg daily  dosing at home.   1. Hypertension.  Stable during the hospitalization on lisinopril.  We      will continue the same  regimen.   DISCHARGE VITALS AND LABS:  Temperature 97.1, blood pressure 135/83, pulse 62, respirations 20,  saturating 96% on room air.  CBG 124.  Sodium 137, potassium 3.7, chloride 99, bicarb 31, glucose 137, BUN 14,  creatinine 0.72, calcium 9.  WBC 6.9, hemoglobin 11.5, MCV 88.3, platelets 219.   Over 30 minutes spent on discharging the patient.      Mliss Sax, MD  Electronically Signed      Mick Sell, MD  Electronically Signed    IM/MEDQ  D:  03/25/2009  T:  03/25/2009  Job:  [id number]

## 2011-02-10 NOTE — Discharge Summary (Signed)
NAMEMEREDETH, FURBER NO.:  [id number]   MEDICAL RECORD NO.:  [id number]          PATIENT TYPE:  IPS   LOCATION:  0401                          [facility]   PHYSICIAN:  Anselm Jungling, MD  DATE OF BIRTH:  1966-05-21   DATE OF ADMISSION:  02/20/2009  DATE OF DISCHARGE:  03/02/2009                               DISCHARGE SUMMARY   IDENTIFYING DATA/REASON FOR ADMISSION:  This was an inpatient  psychiatric admission for [name], a 45 year old unmarried [place] female  who was admitted after being brought in by the police in crisis.  She  had called in herself.  She presented with psychosis.  Please refer to  the admission note for further details pertaining to the symptoms,  circumstances and history that led to her hospitalization.  She was  given an initial Axis I diagnosis of psychosis NOS.   MEDICAL AND LABORATORY:  The patient came to [place] with a history of  hypertension, diabetes mellitus, and was also diagnosed with a  polymicrobial urinary tract infection during her stay.  There was a  medical consultation regarding her medical conditions.  She was also  followed closely by the psychiatric nurse practitioner throughout her  stay.  She was continued on a regimen of metoprolol, Catapres,  Glucophage, glyburide, and Monopril.  The patient was noncompliant with  all of her medications, including Catapres, which was given in a dermal  patch form, in which she repeatedly removed.  At times, her blood  pressure was elevated, and her diabetes was poorly controlled due to her  medication noncompliance.  However, by the time of discharge, she was  complying nicely with all of her medications.   HOSPITAL COURSE:  The patient was admitted to the adult inpatient  psychiatric service.  She presented as a well-nourished, normally-  developed female who was alert, with disorganized thinking.  She was  confused and appeared to have little insight.  When available, we  used  Arabic language interpreters.  Working with this patient was challenging  for the interpreters, as the patient tended to interrupt them while they  were translating and accused them of not translating accurately.  Also,  the patient's speech was highly pressured and disorganized, which made  it challenging for the translators as well.   The patient was treated with combination of Risperdal and Depakote.  It  was felt that in addition to her psychosis, that there appeared to be an  affective component to her illness, hence the Depakote.   The patient required one-to-one staffing due to the level of her  disorganization, and episodic agitation, which was addressed with  appropriate p.r.n. medication.   The patient stabilized gradually over several days.   As mentioned above, her tendency was to refuse all medications  initially.  Fortunately, her mother was able to come in usually at least  once a day, and at that time was able to convince the patient to take  her medications.   We also relied on mother somewhat to indicate to [place] when she felt the  patient was making progress  with respect to mental and cognitive  stabilization.  This was extremely helpful, as the translators we are  using were struggling, as described above.   On hospital day #10, the patient appeared to be appropriate for  discharge.  Her mother indicated she was completely comfortable taking  the patient home.  The patient was compliant with medication and seemed  to be willing to take medication on an outpatient basis.  They agreed to  the following aftercare plan.   AFTERCARE:  The patient was to follow-up at [facility] with an appointment to see their psychiatrist on June 24 at 8:30  a.m.  Also, they were referred to [facility], to be arranged at the  time of this dictation.   DISCHARGE MEDICATIONS:  1. Risperdal 6 mg q.h.s.  2. Depakote 1000 mg q.h.s.  3. Metoprolol XL 100  mg daily.  4. Catapres 0.2 mg b.i.d.  5. Glucophage 500 mg daily.  6. Benztropine 0.5 mg q.h.s.  7. Monopril 40 mg daily.  8. Glyburide 5 mg q.a.m.   DISCHARGE DIAGNOSES:  AXIS I:  Bipolar affective disorder, most recently  manic with psychotic features, resolving.  AXIS II:  Deferred.  AXIS III:  History of hypertension, diabetes mellitus, recent urinary  tract infection.  AXIS IV:  Stressors severe.  AXIS V:  GAF on discharge 50.      Anselm Jungling, MD  Electronically Signed     SPB/MEDQ  D:  03/03/2009  T:  03/04/2009  Job:  [id number]

## 2011-02-10 NOTE — Consult Note (Signed)
[name], LISS NO.:  [id number]   MEDICAL RECORD NO.:  [id number]          PATIENT TYPE:  IPS   LOCATION:  0401                          [facility]ACILITY[facility]  [facility]   PHYSICIAN:  Hollice Espy, M.D.DATE OF BIRTH:  1966/09/13   DATE OF CONSULTATION:  DATE OF DISCHARGE:                                 CONSULTATION   REFERRING PHYSICIAN:  Landry Corporal, N.P./Steven Electa Sniff, M.D.   REASON FOR CONSULT:  Uncontrolled hypertension.   HISTORY OF PRESENT ILLNESS:  [name] is a 45 year old female from  [place] who was admitted to [facility] on Feb 20, 2009,  secondary to major depressive disorder with psychotic features.  Prior  to this admission, she had not slept or eaten for 3 days.  She was also  refusing her psychiatric medications.  She was delusional on admission  believing that her mother was trying to ruin her life.  During this  time, she has also refused her blood pressure medications and had a  blood pressure of 218/120 on admission.  She received clonidine 0.2 mg  in the ED which brought her systolic pressure down into the mid 140s.  Her blood pressure this morning is back up and we are asked to assist  with BP management and diabetes.   ALLERGIES:  CAFFEINE.   PAST MEDICAL HISTORY:  1. Depression.  2. Psychosis.  3. Diabetes, type 2.  4. Hypertension.  5. Obesity.  6. CVA 3 years ago.   PAST SURGICAL HISTORY:  Tooth extraction.   SOCIAL:  Patient lives with her mother and her brother.   FAMILY HISTORY:  Noncontributory and patient unwilling to disclose  information at this time.   MEDICATIONS AT HOME:  Include:  1. Toprol XL 100 mg p.o. daily.  2. Glyburide/metformin 5/500 one tab p.o. daily.  3. Trazodone 200 mg 1 tab p.o. b.i.d.  4. Abilify 10 mg p.o. daily.  5. Hydrochlorothiazide 25 mg p.o. daily.  6. Monopril 40 mg p.o. daily.   INPATIENT MEDICATIONS:  Include:  1. Clonidine 0.1 mg p.o. b.i.d. p.r.n.  2. Risperdal  0.5 mg p.o. q.h.s. and every 6 hours p.r.n.  3. Macrobid 100 mg p.o. b.i.d.  4. Toprol XL 100 mg p.o. daily.  5. Hydrochlorothiazide 25 mg p.o. daily.  6. Monopril 20 mg p.o. daily.   REVIEW OF SYSTEMS:  Patient refused to answer further questions.  There  was an interpreter there at the bedside, however, she remained very  suspicious of nurse practitioner and refused to answer any questions  regarding review of systems.   LABS/RADIOLOGY:  Urine culture greater than 100,000 colonies of multiple  morphotypes.  Urine drug screen negative.  Urinalysis with white blood  cell count of 11 to 20 and large leukocytes.  Hemoglobin 12.9,  hematocrit 39, white blood cell count 7.7, platelets 191.  Urine  pregnancy is negative.  Alcohol is less than 5.  Sodium 139, potassium  3.9, BUN 9, creatinine 0.7, glucose 171.   EXAM:  BP 201/127.  Heart rate 99.  Respiratory rate 20.  Temp 98.6.  O2  not recorded.  GENERAL:  Tearful female, very suspicious.  NECK:  No masses.  No thyroid enlargement.  ENT:  Moist oral mucosa.  RESPIRATORY:  Bowel sounds clear to auscultation bilaterally without  wheezes, rales, or rhonchi.  No increased work of breathing.  CARDIOVASCULAR:  S1 and S2.  Regular rate and rhythm.  No lower  extremity edema is noted.  NEURO:  She is moving all extremities.  No focal deficits are noted.  PSYCHIATRIC:  Suspicious, agitated, restless, and tearful.  SKIN:  Tattoo on right forearm.   ASSESSMENT AND PLAN:  1. Uncontrolled hypertension secondary to med noncompliance.  Patient      is status post Toprol XL 100 mg and hydrochlorothiazide 25 mg at      7:15 a.m. this morning.  It is currently 11:30 a.m.  She received      clonidine 0.1 mg at 6:45 a.m.  Her blood pressure remains elevated      approximately 210/110.  Plan to give a.m. dose of her home Monopril      now and add a Catapres-TTS patch if patient willing.  We will      continue to check blood pressures every 2 hours  until systolic      pressure less than 180.  If systolic pressure does not improve, we      may need to consider transferring the patient to a medical floor      for blood pressure management.  2. Polymicrobial urinary tract infection.  Continue Macrobid b.i.d.  3. Diabetes, type 2.  Her blood sugars have been in the high 100s.  We      will resume her home glyburide/metformin if she is willing.  4. Depression/psychosis.  Management per Psychiatry.      Sandford Craze, NP      Hollice Espy, M.D.  Electronically Signed    MO/MEDQ  D:  02/21/2009  T:  02/21/2009  Job:  [id number]

## 2011-02-13 NOTE — Discharge Summary (Signed)
[name], [name]               ACCOUNT NO.:  [id number]  MEDICAL RECORD NO.:  [id number]           PATIENT TYPE:  I  LOCATION:  6738                         FACILITY:  [facility]  PHYSICIAN:  Rock Nephew, MD       DATE OF BIRTH:  02-29-1966  DATE OF ADMISSION:  02/02/2011 DATE OF DISCHARGE:  02/05/2011                        DISCHARGE SUMMARY - REFERRING   PRIMARY CARE PHYSICIAN:  [facility].  The patient will be seeing Dr. Allena Katz.  DISCHARGE DIAGNOSIS: 1. Acute diastolic congestive heart failure exacerbation, resolved. 2. History of bipolar disorder. 3. Type 2 diabetes mellitus. 4. Hypertension. 5. Hyperlipidemia. 6. Increased TSH with a normal free T4. 7. Lower extremity edema secondary to #1.  DISCHARGE MEDICATIONS: 1. Aspirin 81 mg p.o. daily. 2. Clonazepam 2 mg by mouth at bedtime. 3. Depakote ER 500 mg 2 tablets by mouth daily at bedtime. 4. Fosinopril 40 mg p.o. daily. 5. Insulin NovoLog 4 units subcutaneously 3 times a day with meals. 6. Lantus 30 units subcutaneously at bedtime. 7. Lasix 80 mg p.o. twice daily. 8. Labetalol 200 mg by mouth twice daily. 9. Metformin 1000 mg 1 tablet by mouth twice daily. 10.Potassium chloride 20 mEq p.o. daily.  DISPOSITION:  The patient is discharged home.  DIET:  Carbohydrate-modified low-salt with 1.5 liters fluid restriction.  PROCEDURES PERFORMED:  The patient had portable chest x-ray on Feb 02, 2011 which showed cardiomegaly and prominent interstitial markings, probable congestive heart failure.  The patient also had a 2-D echocardiogram, which showed a left ventricular ejection fraction of 60- 65%, possible basal inferior hypokinesias features consistent with pseudonormal left ventricular filling pattern, grade 2 diastolic dysfunction.  No aortic valve stenosis.  No mitral regurg.  Trivial pericardiac effusion was identified.  Also, the patient had a bilateral lower extremity Dopplers which showed no evidence of a  DVT.  CONSULTATIONS ON THIS CASE:  None.  FOLLOWUP:  The patient should follow up with Dr. Allena Katz on Feb 17, 2011 at 9:30 a.m.  The patient should also follow up with Dr. Jens Som, [facility] in 2-4 weeks.  The patient should also have outpatient thyroid function tests done in 5 weeks.  BRIEF HISTORY OF PRESENT ILLNESS:  This is a 45 year old female with a known history of diastolic dysfunction, comes in with progressive lower extremity edema and some shortness of breath.  She also has some shortness of breath, orthopnea, PND.  She came to the hospital for evaluation.  The chest x-ray showed some pulmonary vascular congestion. The patient's beta natriuretic peptide was mildly elevated at 624.  The patient's pro-BNP was mildly elevated at 624.7.  HOSPITAL COURSE: 1. Acute diastolic CHF.  The patient was placed on IV Lasix, initially     40 mg IV q.12 h. then it was increased to 80 mg IV q.12 h.  The     patient had good urinary output, and she diuresed well. 2. Bipolar disorder.  The patient's bipolar disorder has been stable.The patient is continued on Depakote for bipolar disorder. 3. Type 2 diabetes mellitus.  The patient received Lantus and sliding     scale during the hospitalization.  Sugar  control was in acceptable     range.  Sugars varied from about 163 to about 100. 4. Hypertension.  The patient's blood pressure has been controlled.     The patient is taking labetalol, the ACE inhibitor, her home     medications. 5. Hyperlipidemia.  The patient has a history of hyperlipidemia.     However, she is not on any statin medications at this time.  The     patient's LDL is 69, triglycerides were elevated at 176.  The     patient should observe a low-fat diet along with carb-modified, low     salt. 6. Increased TSH.  The patient has TSH that was elevated at 5.47.     However, the patient's free T4 was normal at 0.92.  The patient     should have a repeat thyroid function  test done in about 5 weeks. 7. Lower extremity edema.  The patient was noted to have massive lower     extremity edema.  There was also probably component of venous     stasis.  I spoke to the patient about sport stockings; however, she     said that sport stockings will make her lower extremity edema worse     and make the pain worse.  The patient's lower extremity edema     improved with high-dose IV Lasix. 8. DVT prophylaxis.  The patient received Lovenox for DVT prophylaxis.     Rock Nephew, MD     NH/MEDQ  D:  02/05/2011  T:  02/05/2011  Job:  [id number]  cc:   Madolyn Frieze. Jens Som, MD, [facility] Dr. Allena Katz  Electronically Signed by Rock Nephew MD on 02/13/2011 04:42:40 PM

## 2011-02-13 NOTE — H&P (Signed)
NAMEKADASIA, KASSING               ACCOUNT NO.:  [id number]   MEDICAL RECORD NO.:  [id number]          PATIENT TYPE:  INP   LOCATION:  3710                         FACILITY:  [facility]   PHYSICIAN:  Rolm Gala, M.D.    DATE OF BIRTH:  May 07, 1966   DATE OF ADMISSION:  05/17/2006  DATE OF DISCHARGE:                                HISTORY & PHYSICAL   PRIMARY CARE PHYSICIAN:  Dr. __________at [facility].   CHIEF COMPLAINT:  Chest pain, dyspnea.   HISTORY OF PRESENT ILLNESS:  [name] with a 10-day history of  lethargy, muscles aches, weakness, and subjective fever.  She described  lying in a bed since this all started.  Five days ago, she began to have  significant shortness of breath with orthopnea, dyspnea on exertion.  This  dyspnea on exertion resolved with rest.  Two days ago, she began having  chest pain.  She describes this as a constant discomfort and tightness that  worsens with movement, better with rest as well as diet.  On top of this  constant chest discomfort, she has intermittent, stabbing pains that last 15  to 20 minutes.  The patient says her dyspnea and chest pain worsens at  night.  The patient also complains of diaphoresis with the chest pain, the  stabbing type, no nausea or vomiting.  No radiation.  Palpitations that feel  like her heart is pumping in her jaw.   REVIEW OF SYSTEMS:  No headache, no dizziness, no cough, no TB exposure, no  reflux or esophageal burning.  No edema.  Good p.o. intake recently.   PAST MEDICAL HISTORY:  1. Diabetes, diet controlled.  2. History of thyrotoxicosis years ago.  3. Hypertension.  4. Depression.  5. Right leg numbness.   SOCIAL HISTORY:  The patient is from [place].  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  She lives with her 74 year old  mom.  No tobacco, alcohol, or drugs.   FAMILY HISTORY:  Bad heart problems on both sides of extended family.  Mom  in her early 64s  with heart attack, hypertension.  Dad died of a stroke at  36.   ALLERGIES:  CAFFEINE.   MEDICATIONS:  1. Elavil 80 mg daily.  2. Monopril 10 mg daily.  3. Atenolol 50 mg daily.  4. Patient used to take diabetes meds, but she stopped a year ago when she      began to be diet controlled.   PHYSICAL EXAMINATION:  VITAL SIGNS:  Temp 97.9, pulse 101 down from 89,  blood pressure 152/82, respirations 20 to 25, 98% on room air.  HEENT:  Right ear without cone of light, positive fluid.  Left TM clear.  Oropharynx with black spots on palate and tongue, poor dentition.  NECK:  No thyromegaly, no cervical or anterior lymphadenopathy.  CARDIOVASCULAR:  Regular rate and rhythm, no murmur or JVD.  LUNGS:  Clear to auscultation bilaterally without increased work of  breathing.  ABDOMEN:  Soft, nontender, positive bowel sounds.  EXTREMITIES:  No clubbing, cyanosis, or edema.  LABORATORY DATA:  BNP 423, sodium 136, potassium 4.2, chloride 105, bicarb  26.6, BUN 9, creatinine 0.7, glucose 83, D-dimer 1.69, hemoglobin 10.5,  point-of-care enzymes were negative x1.   ASSESSMENT AND PLAN:  [name] with:  1. Chest pain with shortness of breath:  Differential diagnoses will      include viral etiology-pneumonia ?, myocardial infarction, pulmonary      embolus, myocarditis, congestive heart failure, tuberculosis.  Will      check chest x-ray PA and lateral.  Emergency room doctor wrote for a CT      scan given elevated D-dimer.  Check a CT.  Rule out myocardial      infarction with cardiac enzymes, telemetry.  Check an ESR.  Two-D      echocardiogram to evaluate for congestive heart failure.  Consider      cardiac consult given significant family history and concerning story.  2. Depression:  Continue Elavil.  3. Diabetes:  Low carbohydrate diet.  Check hemoglobin A1c.  4. Hypertension.  Atenolol and Monopril at home doses.  5. Prophylaxis:  Lovenox.      Rolm Gala,  M.D.     Bennetta Laos  D:  05/17/2006  T:  05/17/2006  Job:  [id number]

## 2011-02-13 NOTE — Discharge Summary (Signed)
[name], [name]               ACCOUNT NO.:  [id number]   MEDICAL RECORD NO.:  [id number]          PATIENT TYPE:  INP   LOCATION:  3710                         FACILITY:  [facility]   PHYSICIAN:  Broadus John T. Pickard II, MDDATE OF BIRTH:  July 14, 1966   DATE OF ADMISSION:  05/17/2006  DATE OF DISCHARGE:  05/19/2006                                 DISCHARGE SUMMARY   ATTENDING AT DISCHARGE:  Was Dr. Asencion Partridge.   ACTING INTERN AT DISCHARGE:  Was Corbin Ade, pager number [phone].   PRIMARY PHYSICIAN:  Is Dr. Margarette Canada at [facility]; however, the  patient will be following up at [facility].   REASON FOR ADMISSION:  Chest pain and dyspnea.   DISCHARGE DIAGNOSES:  1. Chest pain/dyspnea.  2. Hilar adenopathy.  3. Diet controlled diabetes mellitus.  4. Hypertension.  5. Psychiatric history, subjective of depression and anxiety; however, [facility]      [facility] show bipolar and posttraumatic stress      disorder.  6. Iron deficiency anemia, which is a new diagnosis.   PROCEDURES:  On May 17, 2006 the patient received a CT scan of the chest,  which showed heart size of upper normal limits with pulmonary edema and  bilateral pleural effusion.  It also showed increase in mediastinal lymph  nodes, prominent bihilar lymphoid tissue, possibly reactive.  On May 17, 2006, she also received a portable chest x-ray which showed borderline  cardiomegaly, prominent hila and questionable vascular congestion versus  adenopathy.  On May 18, 2006, she received 2D transthoracic  echocardiogram, which showed a normal left ventricular systolic function.  Ejection fraction was estimated to be 70%.  There is no regional wall motion  abnormality identified in the left ventricular.  Left ventricular wall  thickness was mildly increased.  There is mild focal basal septal  hypertrophy.  Doppler parameters were consistent with height left  ventricular blood  pressure.  Aortic valve thickness was mildly increased.  There was mild mitral valve regurgitation.  The left atrium was moderately  dilated.  Estimated peak of right ventricular systolic pressure was mild to  moderately increased.  There was mild to moderate tricuspid valve  regurgitation.   LABORATORY DATA ON ADMISSION:  Basic metabolic panel shows sodium of 139,  potassium 4.0, chloride 104, bicarb 28, BUN 9, creatinine 0.6, glucose 68,  calcium 9.1.  CBC showed a white count of 7.1, hemoglobin 9.2, hematocrit  28.6 with an MCV of 72.4, platelet count of 282.  The differential with that  showed an ANC of 5.0.  ESR was 13.  TSH was upper normal limits at 5.28.  Hemoglobin A1c 6.2.  Lipid profile showed a total cholesterol of 146,  triglycerides 66, HDL 69, LDL 64.   LABORATORY DATA AT DISCHARGE:  Complete metabolic panel shows a sodium of  136, potassium 4.0, chloride 102, bicarb 27, BUN 10, creatinine 0.5, glucose  150.  T-bili of 0.5, alkaline phosphatase of 85, AST 23, ALT 10.  Total  protein 6.6, albumin 0.1, calcium 9.4.  CBC showed a white count of  7.1,  hemoglobin 9.3, hematocrit 29.3, MCV 73.6, platelets were 270, ANC was 4.4.  Ferritin level was 5.  Free T4 level was 0.92.   HOSPITAL COURSE:  Patient is a 45 year old African American female who  admitted to the hospital after a 10 day history of lethargy, muscles aches,  weakness and subjective fever.  She was admitted to the telemetry floor,  where her cardiac status was monitored throughout her hospital stay.  On  admission, she had an ESR of 13, BNP of 423, a D-dimer of 169.  Because of  the elevated D-dimer, patient received a CT scan of the chest, which  respectively ruled out a pulmonary embolism; however, did show bilateral  pleural effusions, as well as pulmonary edema.  In addition to mediastinal  lymph nodes and bihilar prominent adenopathy, we were also able to  effectively rule out acute myocardial infarction by  getting cardiac enzymes  that were negative x3 during her stay.  On the second day of hospital stay,  the patient subjectively was much, much improved.  She denied having anymore  chest pain, shortness of breath and was frequently seen ambulating up and  down the hallway, which is a contrast to her extreme dyspnea on exertion  that she had prior to admission.  She received a 2D echocardiogram that was  transthoracic on day 2, that showed results previously stated; however,  there was significant abnormality shown on the study.  We also drew an ACE  level to evaluate for the possibility of sarcoidosis; however, that level is  still pending at discharge.  We also got a free T-4 level to rule out a  possible diagnosis of hyperthyroid given the patient's lethargy and weight  gain symptoms.  During her hospital stay, patient's blood pressure was  likely monitored by using.  We also gave her lisinopril 20 mg daily.  We  also gave her some Lasix 20 daily to help diuresis fluid off the Protonix.  At admission, the patient was found to be fairly anemic, which proved to be  microcytic with an MCV of 73.  Iron studies showed a low ferritin, felt to  be a new diagnosis of iron deficiency.  The patient was started on ferrous  sulfate 325 mg b.i.d. for this.  Patient was originally admitted and she  stated that she had history of anxiety, depression; thus, was taking Elavil  _________ a day for this; however, later she revised her statement to the  nurse stating that she is now taking Abilify __________ mg daily.  In house  records from [facility] where she has seen her primary care.  __________ showed that she did have a history of bipolar disorder, as well  as PTSD.  Dr. _________ at [facility], according to the  patient, provided her this prescription with Abilify; however, her psychiatric history will certainly need to be readdressed at her followup  appointment at [facility] to determine the best possible  medication regimen for this.  On day 3, patient continued to be free of  chest pain or dyspnea on exertion and was again ambulating through the  hallways in no distress whatsoever.  Once we received the results of her  echocardiogram, we felt that she was stable for discharge with followup the  following week at [facility].   PENDING LAB TESTS AT DISCHARGE:  Serum ACE level.   DISCHARGE MEDICATIONS:  1. Abilify 20 mg p.o. q.h.s.  2. Lisinopril/Prinivil/Monopril  20 mg p.o. daily.  Of note it will be      discussed with the upper level as to which of these ACE inhibitors      patient will be put on as the patient does find that the lisinopril      causes her to be depressed, which is why she needs to be on Monopril;      however, lisinopril is generic Monopril __________ drug.  3. Ferrous sulfate 325 mg p.o. b.i.d.  4. Aspirin 81 mg p.o. daily.  5. Atenolol 50 mg p.o. daily.   DISPOSITION:  The patient claims that she, for some reason that I did not  understand, would not be accepted back at [facility], thus we have  her set up for a followup appointment next Wednesday, May 26, 2006, at  2:30 p.m. with Dr. Gilmore Laroche of [facility].  At this time, Dr.  Tanya Nones will discuss the results of the serum ACE levels to determine  __________.  She should certainly __________ .  They should certainly  discuss her blood pressure medication as well.  __________Of note, she  should also have some sort of psychiatric __________.  She will currently be  discharged on Abilify __________.   __________.   DISCHARGE INSTRUCTIONS:  Patient has no restrictions on diet.  Is encouraged  to increase her caloric intake.  Avoid sugars.  She has no restrictions on  activity.      Broadus John T. Pamalee Leyden, MD     WTP/MEDQ  D:  05/19/2006  T:  05/19/2006  Job:  [id number]

## 2011-02-13 NOTE — Consult Note (Signed)
[name], [name]               ACCOUNT NO.:  [id number]   MEDICAL RECORD NO.:  [id number]          PATIENT TYPE:  EMS   LOCATION:  MAJO                         FACILITY:  [facility]   PHYSICIAN:  Broadus John T. Pickard II, MDDATE OF BIRTH:  02-05-1966   DATE OF CONSULTATION:  05/21/2006  DATE OF DISCHARGE:                                   CONSULTATION   HOSPITAL COURSE:  Atypical chest pain.   HISTORY OF PRESENT ILLNESS:  The patient is a 45 year old African American  female with recent admission for chest pain.  Workup included cardiac  enzymes negative x3, a CT angiogram negative for PE, although it did show  hilar adenopathy, and a 2-D echo of the chest with normal wall motion and an  ejection fraction of 70%.  She was discharged home with atypical chest pain  and it was felt that some component of anxiety was contributing.  During the  hospitalization, the patient was ambulatory without pain, asymptomatic.   However, starting yesterday, she has had sharp pain and shortness of breath.  They are episodic, lasting 5 to 20 minutes.  They are nonexertional.  Episode 1 occurred yesterday afternoon while sitting on the couch eating  soup.  It lasted approximately 20 minutes.  It was described as almost a  pleuritic pain that gradually went away on its own and was not related to  exertion.  Episode #2 occurred this morning at 1:45 a.m. while the patient  was sleeping, it lasted 5 minutes and, again, was not related to exertion  and gradually got better.  Episode #3 occurred this morning at 6:00 a.m.  while sleeping.  Again, lasted about 5 minutes and went away, it was not  related to any exertion.  Patient feels very anxious when this happens and  tachypnea and begins breathing heavily, which causes the pain to be worse.  She says it is mainly a mild discomfort, which she actually thinks is in her  lungs and is reproduced on exam with palpitation on the sternum today in the  emergency  department.   PAST MEDICAL HISTORY:  1. Chest pain, atypical in nature.  2. Hilar adenopathy with a normal ACE level.  3. Diabetes mellitus, diet controlled.  4. Posttraumatic stress disorder, bipolar disorder.  5. Iron deficiency anemia.   MEDICATIONS:  1. Abilify 20 mg p.o. q.h.s.  2. Monopril 20 mg p.o. daily.  3. Iron sulfate 325 mg p.o. b.i.d.  4. Aspirin 81 mg p.o. daily.  5. Atenolol 50 mg p.o. daily.   PCP:  The patient was dismissed from [facility], or she says  she cannot go back there for some reason and she is yet to follow up with  the [facility].   ALLERGIES:  CAFFEINE.   SOCIAL HISTORY:  Patient is from [place] and lives with her 41 year old mom.  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  However,  examinations and discussions with her have led [place] to doubt whether or not  she is actually a physician.  She denies any alcohol, tobacco,  or drugs.   FAMILY HISTORY:  Mom had an MI supposedly in her 86s and dad had a stroke  supposedly at age 56.  However, we have received different ages on  questioning at other times.   PHYSICAL EXAMINATION:  VITAL SIGNS:  Temperature 98.5, pulse 95, respiratory  rate 22, blood pressure 157/107, SPO2 100% on room air.  GENERAL:  No apparent distress alert and oriented x3.  She is smiling and  laughing when I examine her today in the emergency department.  HEENT:  Atraumatic, normocephalic.  Pupils are equal, round, and reactive to  light.  Extraocular movements are intact.  TMs and canal are clear.  On  examination, there is no erythema or exudate of the posterior oropharynx.  NECK:  No lymphadenopathy, no thyromegaly, no JVD.  CARDIOVASCULAR:  Regular rate and rhythm.  No murmurs, rubs, or gallops.  The pain is exactly reproduced by pressing on the sternum and the margin of  the ribs.  She says that is the pain and smiles when I pressed on them, and  then it went away as  soon as I finished pressing on it.  PULMONARY:  Clear to auscultation bilaterally.  No wheezes, crackles, rales.  ABDOMEN:  Soft, nontender, nondistended.  Positive bowel sounds.  EXTREMITIES:  No cyanosis, clubbing, or edema.  NEUROLOGIC:  Cranial nerves II-XII are grossly intact.  Muscle strength 5/5,  equal and symmetric in the upper and lower extremities.   LABORATORY DATA:  Sodium 137, potassium 3.9, chloride 106, bicarb 23, BUN  12, creatinine 0.5, glucose 118.  Hemoglobin is 11, hematocrit is 32,  myoglobin 7.6.  CK-MB is 1. Troponin 0.05.  This is a negative set of  cardiac enzymes.  Portable chest x-ray shows cardiomegaly with mild edema  and no significant change from her previous x-ray less than 2 days ago.  EKG  shows no significant change since May 18, 2006.  It shows normal sinus  rhythm at 92 beats per minute.  Normal intervals, normal axis, bilateral  atrial enlargement.  No LVH and there are no acute ST changes, T-waves, or  flip T-waves.   ASSESSMENT/PLAN:  1. Atypical chest pain.  Thorough workup during hospitalization from      May 17, 2006 to May 19, 2006 revealed no cardiac or pulmonary      etiology.  Physical exam today is consistent with costochondritis      versus pleurisy.  At any rate, this is not life threatening.  I do      believe anxiety (and her previous psychiatric history) is also      contributory.  Will encourage NSAIDs, Motrin 800 mg p.o. t.i.d. as      treatment for costochondritis.  PPI is empiric therapy for possible      GERD and reassurance.  2. Dyspnea, multifactorial.  During last hospitalization, patient has a      normal echo, so there is no evidence of congestive heart failure.  She      has subclinical hyperthyroidism.  However, she has a profound iron      deficiency anemia, which I believe is contributory.  As an outpatient,     would also get PFTs to rule out a restrictive process given her obesity      and body habitus.   Also consider obesity hypoventilation syndrome.  For      now, just treat her anemia.  3. Hypertension.  Continue her Monopril and atenolol and increase as an  outpatient as indicated.  4. Hilar adenopathy.  Here ACE level is normal.  I will re-scan the      patient as an outpatient and will arrange outpatient bronchoscopy and      biopsy with pulmonary clinic.   DISPOSITION:  Will treat this patient as costochondritis, discharged home  with NSAIDs and follow up with me on May 26, 2006 at [facility] at 2:30 in the afternoon.  This dictation was prior to my  discussion with the attending, Dr. Mardelle Matte, if she feels that the patient  requires admission, I will dictate an addendum to this consultation.      Broadus John T. Pamalee Leyden, MD     WTP/MEDQ  D:  05/21/2006  T:  05/21/2006  Job:  [id number]

## 2011-02-18 ENCOUNTER — Encounter: Payer: Self-pay | Admitting: *Deleted

## 2011-02-18 DIAGNOSIS — Z8679 Personal history of other diseases of the circulatory system: Secondary | ICD-10-CM

## 2011-02-19 ENCOUNTER — Ambulatory Visit (INDEPENDENT_AMBULATORY_CARE_PROVIDER_SITE_OTHER): Payer: Self-pay | Admitting: Cardiology

## 2011-02-19 ENCOUNTER — Encounter: Payer: Self-pay | Admitting: Cardiology

## 2011-02-19 DIAGNOSIS — E66813 Obesity, class 3: Secondary | ICD-10-CM | POA: Insufficient documentation

## 2011-02-19 DIAGNOSIS — E669 Obesity, unspecified: Secondary | ICD-10-CM

## 2011-02-19 DIAGNOSIS — Z8679 Personal history of other diseases of the circulatory system: Secondary | ICD-10-CM

## 2011-02-19 DIAGNOSIS — I10 Essential (primary) hypertension: Secondary | ICD-10-CM

## 2011-02-19 DIAGNOSIS — I509 Heart failure, unspecified: Secondary | ICD-10-CM

## 2011-02-19 HISTORY — DX: Morbid (severe) obesity due to excess calories: E66.01

## 2011-02-19 LAB — BRAIN NATRIURETIC PEPTIDE: Pro B Natriuretic peptide (BNP): 439 pg/mL — ABNORMAL HIGH (ref 0.0–100.0)

## 2011-02-19 LAB — BASIC METABOLIC PANEL
Calcium: 9.1 mg/dL (ref 8.4–10.5)
GFR: 165.52 mL/min (ref >60.00)
Potassium: 3.9 mEq/L (ref 3.5–5.1)
Sodium: 138 mEq/L (ref 135–145)

## 2011-02-19 MED ORDER — LABETALOL HCL 200 MG PO TABS: ORAL_TABLET | ORAL | Status: DC

## 2011-02-19 NOTE — Assessment & Plan Note (Signed)
Blood pressure elevated. Increase labetalol to 400 mg p.o. B.i.d.

## 2011-02-19 NOTE — Progress Notes (Signed)
Addended by: Rocco Serene on: 02/19/2011 10:11 AM   Modules accepted: Orders

## 2011-02-19 NOTE — Patient Instructions (Signed)
Please increase your Labetalol to 400 mg twice a day. Continue other medications as listed. Have BMP and BNP today (428.32 401.1 786.51) You have been referred to pulmonary for an evaluation of sleep apnea You are being scheduled for a Lexiscan Myoview.  Please follow the instruction sheet given.

## 2011-02-19 NOTE — Assessment & Plan Note (Signed)
Discussed importance of weight loss. Pulmonary evaluation for possible sleep apnea.

## 2011-02-19 NOTE — Progress Notes (Signed)
HPI: 45 yo for f/u of CHF.  A Myoview showed no ischemia or infarction and an ejection fraction of 63%.. A BNP was mildly elevated at 342. Echocardiogram in May of 2012 showed normal LV function, grade 2 diastolic dysfunction, biatrial enlargement. Patient admitted in May of 2012 with congestive heart failure exacerbation. This improved with diuresis. Note TSH mildly elevated. Since her discharge she does have dyspnea on exertion. She also notes mild orthopnea. Her pedal edema has improved. She also notes chest discomfort with exertion relieved with rest. She states she has been compliant with her medications. Note she did stop these for 4 months previously on her own.  Current Outpatient Prescriptions  Medication Sig Dispense Refill  . ARIPiprazole (ABILIFY) 5 MG tablet Take 5 mg by mouth daily.        Marland Kitchen aspirin 81 MG tablet Take 81 mg by mouth daily.        . clonazePAM (KLONOPIN) 2 MG tablet Take 2 mg by mouth daily.        . divalproex (DEPAKOTE) 500 MG EC tablet daily. 2 tab po qd      . fosinopril (MONOPRIL) 40 MG tablet Take 40 mg by mouth daily.        . furosemide (LASIX) 40 MG tablet 2 tabs po bid      . insulin aspart (NOVOLOG) 100 UNIT/ML injection 4 units tid       . insulin glargine (LANTUS) 100 UNIT/ML injection 32 units qd       . labetalol (NORMODYNE) 200 MG tablet Take 200 mg by mouth 2 (two) times daily.        . metFORMIN (GLUMETZA) 1000 MG (MOD) 24 hr tablet Take 1,000 mg by mouth 2 (two) times daily.        . potassium chloride SA (K-DUR,KLOR-CON) 20 MEQ tablet Take 20 mEq by mouth 2 (two) times daily.        Marland Kitchen DISCONTD: amLODipine (NORVASC) 5 MG tablet Take 5 mg by mouth daily.        Marland Kitchen DISCONTD: divalproex (DEPAKOTE) 250 MG EC tablet Take 250 mg by mouth daily.        Marland Kitchen DISCONTD: glyBURIDE-metformin (GLUCOVANCE) 5-500 MG per tablet Take 1 tablet by mouth daily with breakfast.        . DISCONTD: risperiDONE (RISPERDAL) 3 MG tablet Take 3 mg by mouth daily.        Marland Kitchen DISCONTD:  spironolactone (ALDACTONE) 25 MG tablet Take 25 mg by mouth daily.        Marland Kitchen DISCONTD: traZODone (DESYREL) 150 MG tablet Take 150 mg by mouth at bedtime.        Marland Kitchen DISCONTD: trihexyphenidyl (ARTANE) 2 MG tablet Take 2 mg by mouth 2 (two) times daily with a meal.           Past Medical History  Diagnosis Date  . Diabetes mellitus   . Hypertension   . CHF (congestive heart failure)   . Psychosis   . Obesity   . ANEMIA NOS   . ANXIETY DISORDER, GENERALIZED   . DEGENERATIVE JOINT DISEASE, KNEE   . DISORDER, BIPOLAR NOS   . HYPOTHYROIDISM NOS   . OBESITY NOS   . THYROTOXICOSIS     No past surgical history on file.  History   Social History  . Marital Status: Single    Spouse Name: N/A    Number of Children: N/A  . Years of Education: N/A   Occupational History  .  Not on file.   Social History Main Topics  . Smoking status: Never Smoker   . Smokeless tobacco: Not on file  . Alcohol Use: Not on file  . Drug Use: Not on file  . Sexually Active: Not on file   Other Topics Concern  . Not on file   Social History Narrative  . No narrative on file    ROS: no fevers or chills, productive cough, hemoptysis, dysphasia, odynophagia, melena, hematochezia, dysuria, hematuria, rash, seizure activity, orthopnea, PND, pedal edema, claudication. Remaining systems are negative.  Physical Exam: Well-developed obese in no acute distress.  Skin is warm and dry.  HEENT is normal.  Neck is supple. No thyromegaly.  Chest is clear to auscultation with normal expansion.  Cardiovascular exam is regular rate and rhythm. 2/6 SEM  Abdominal exam nontender or distended. No masses palpated. Extremities show trace edema. neuro grossly intact

## 2011-02-19 NOTE — Assessment & Plan Note (Signed)
Patient with symptoms concerning for angina. I discussed cardiac catheterization today including risks and benefits. She would prefer to avoid this if possible. We will therefore give with a stress Myoview. If it shows abnormalities then we will proceed with cardiac catheterization.

## 2011-02-19 NOTE — Assessment & Plan Note (Signed)
Patient with diastolic CHF. Continue present dose of diuretics. Check potassium, renal function and BNP. If BNP elevated will increase diuretics.

## 2011-02-26 ENCOUNTER — Ambulatory Visit (HOSPITAL_COMMUNITY): Payer: Self-pay | Attending: Cardiology | Admitting: Radiology

## 2011-02-26 ENCOUNTER — Other Ambulatory Visit: Payer: Self-pay | Admitting: *Deleted

## 2011-02-26 VITALS — Ht 67.0 in | Wt 308.0 lb

## 2011-02-26 DIAGNOSIS — E109 Type 1 diabetes mellitus without complications: Secondary | ICD-10-CM

## 2011-02-26 DIAGNOSIS — R0789 Other chest pain: Secondary | ICD-10-CM

## 2011-02-26 DIAGNOSIS — R0609 Other forms of dyspnea: Secondary | ICD-10-CM

## 2011-02-26 DIAGNOSIS — Z8679 Personal history of other diseases of the circulatory system: Secondary | ICD-10-CM

## 2011-02-26 MED ORDER — REGADENOSON 0.4 MG/5ML IV SOLN
0.4000 mg | Freq: Once | INTRAVENOUS | Status: AC
  Administered 2011-02-26: 0.4 mg via INTRAVENOUS

## 2011-02-26 MED ORDER — TECHNETIUM TC 99M TETROFOSMIN IV KIT
33.0000 | PACK | Freq: Once | INTRAVENOUS | Status: AC | PRN
  Administered 2011-02-26: 33 via INTRAVENOUS

## 2011-02-26 NOTE — Progress Notes (Signed)
[facility] SITE 3 NUCLEAR MED [address] [place] [address] [phone]  Cardiology Nuclear Med Study  [name] is a 45 y.o. female [id number] 1966/04/22   Nuclear Med Background Indication for Stress Test:  Evaluation for Ischemia and Post Hospital on 5/12 CHF  History:  8/10 MPS: (-) ischemia, EF=63%;5/12 Echo: NL LVF; history of CHF Cardiac Risk Factors: Hypertension, IDDM Type 2 and Lipids  Symptoms:  Chest Pain (last date of chest discomfort today), DOE, Palpitations, Rapid HR and SOB   Nuclear Pre-Procedure Caffeine/Decaff Intake:  None NPO After: 9:00am   Lungs:  clear IV 0.9% NS with Angio Cath:  22g  IV Site: R Antecubital  IV Started by:  Irean Hong, RN  Chest Size (in):  40 Cup Size: B  Height: 5\' 7"  (1.702 m)  Weight:  308 lb (139.708 kg)  BMI:  Body mass index is 48.24 kg/(m^2). Tech Comments:  N/A    Nuclear Med Study 1 or 2 day study: 2 day  Stress Test Type:  Lexiscan  Reading MD:  P.Nahser MD Order Authorizing Provider:  Dr. Olga Millers  Resting Radionuclide: Technetium 17m Tetrofosmin  Resting Radionuclide Dose:40mCi   Stress Radionuclide:  Technetium 49m Tetrofosmin  Stress Radionuclide Dose: 33 mCi           Stress Protocol Rest HR: 78 Stress HR: 91  Rest BP: 130/71 Stress BP: 122/68  Exercise Time (min): n/a METS: n/a   Predicted Max HR: 177 bpm % Max HR: 51.41 bpm Rate Pressure Product: 28413   Dose of Adenosine (mg):  n/a Dose of Lexiscan: 0.4 mg  Dose of Atropine (mg): n/a Dose of Dobutamine: n/a mcg/kg/min (at max HR)  Stress Test Technologist: Bonnita Levan, RN  Nuclear Technologist:  Domenic Polite, CNMT     Rest Procedure:  Myocardial perfusion imaging was performed at rest 45 minutes following the intravenous administration of Technetium 77m Tetrofosmin. Rest ECG: NSR  Stress Procedure:  The patient received IV Lexiscan 0.4 mg over 15-seconds.  Technetium 42m Tetrofosmin injected at 30-seconds.   There were no significant changes with Lexiscan.  Quantitative spect images were obtained after a 45 minute delay. Stress ECG: No significant change from baseline ECG  QPS Raw Data Images:  Patient motion noted; appropriate software correction applied. Stress Images:  Normal homogeneous uptake in all areas of the myocardium. Rest Images:  Normal homogeneous uptake in all areas of the myocardium. Subtraction (SDS):  No evidence of ischemia. Transient Ischemic Dilatation (Normal <1.22):  1.00 Lung/Heart Ratio (Normal <0.45):  .46  Quantitative Gated Spect Images QGS EDV:  113 ml QGS ESV:  27 ml QGS cine images:  NL LV Function; NL Wall Motion QGS EF: 77%  Impression Exercise Capacity:  Lexiscan with no exercise. BP Response:  Normal blood pressure response. Clinical Symptoms:  No chest pain. ECG Impression:  No significant ST segment change suggestive of ischemia. Comparison with Prior Nuclear Study: No images to compare  Overall Impression:  Normal stress nuclear study.  There is no evidence of ischemia.  Normal LV function.    Elyn Aquas., MD, [facility]

## 2011-02-26 NOTE — H&P (Signed)
[name], [name]               ACCOUNT NO.:  [id number]  MEDICAL RECORD NO.:  [id number]           PATIENT TYPE:  I  LOCATION:  6738                         FACILITY:  [facility]  PHYSICIAN:  Lonia Blood, M.D.      DATE OF BIRTH:  1966/08/15  DATE OF ADMISSION:  02/02/2011 DATE OF DISCHARGE:                             HISTORY & PHYSICAL   PRIMARY CARE PHYSICIAN:  [facility].  CARDIOLOGIST:  [facility].  PRESENTING COMPLAINT:  Lower extremity edema and some shortness of breath.  HISTORY OF PRESENT ILLNESS:  The patient is a 45 year old female with known history of diastolic dysfunction from a 2-D echo performed in 2010 who is here today secondary to progressive lower extremity swelling that started about 10 days ago.  It was gradual and getting worse.  She has some mild pain over there and is bilateral. It started from lower leg and is all the way to her knee at the moment.  She is feeling pain.  She also has some shortness of breath, orthopnea, PND.  She denied any dizziness.  She denied any redness or any color change in her lower extremity.  She denied any nausea, vomiting, or diarrhea.  No significant chest pain.  The patient also denied any cough.  No hematemesis.  No hemoptysis.  PAST MEDICAL HISTORY:  Her past medical history significant for known CHF with diastolic dysfunction, EF 50%-60%, diabetes type 2, hypertension, history of bipolar disorder, history of microcytic anemia, hyperlipidemia, morbid obesity, history of psychosis, questionable history of coronary artery disease in the past.  ALLERGIES:  CAFFEINE, ACETAMINOPHEN, AND LISINOPRIL.  CURRENT MEDICATIONS:  Include aspirin, Depakote, fosinopril, Klonopin, labetalol, Lasix, metformin, potassium chloride, and Risperdal.  SOCIAL HISTORY:  The patient lives with her mother.  She denies tobacco, alcohol, or IV drug use.  She is single.  FAMILY HISTORY:  Significant for coronary artery disease and  stroke.  REVIEW OF SYSTEMS:  All system reviewed are negative except per HPI.  PHYSICAL EXAMINATION:  VITAL SIGNS:  Temperature 98.7, blood pressure 136/71, pulse 85, respiratory rate of 20, sats 95% on 2 liters. GENERAL:  She is awake, alert, oriented, obese woman.  She is in no acute distress. HEENT: PERRL.  EOMI.  No pallor, no jaundice.  No rhinorrhea. NECK:  Supple.  No JVD, no lymphadenopathy. RESPIRATORY:  She has decreased air entry at the bases with some mild coarse crackles.  No wheezes.  No rales. CARDIOVASCULAR SYSTEM:  She has S1-S2 no audible murmur. ABDOMEN:  Soft full, nontender with positive bowel sounds. EXTREMITIES:  Her extremities showed 2+ pedal edema up to the knee. SKIN:  No rashes.  No ulcers.  No color change. MUSCULOSKELETAL:  No significant joint swelling, tenderness, or disfigurement.  LABS:  Initial cardiac enzymes are negative.  Her serum Depakote level is 42.6. Sodium 140, potassium 3.7, chloride 100, CO2 is 33, glucose 106, BUN 12, creatinine is 0.64 with normal renal functions.  Albumin is 3.1 calcium 9.5.  ProBNP is 624.  White count is 7.8, hemoglobin 10.3 with platelet count of 275.  Chest x-ray showed cardiomegaly with prominent interstitial markings, probable  congestive heart failure.  Her EKG shows normal sinus rhythm with inverted P-waves and poor R-wave progression, rate of 82.  ASSESSMENT:  This is a 45 year old female with known history of CHF with diastolic dysfunction as well as diabetes, hypertension who is here with progressive lower extremity swelling as well as evidence of congestive heart failure.  Her BNP is markedly elevated, however she is obese.  The patient also has not had any echo in 2 years.  So her condition may have worsened since the last time.  PLAN: 1. Acute congestive heart failure exacerbation.  The patient has     already been seen by [facility] in the ED.  Will admit the     patient to a tele bed.  Start  her on IV diuresis.  Continue with     beta blockers, ACE inhibitors.  We will cycle her enzymes.  I will     get a new 2-D echo since she has not had a 2-D echo since 2010. 2. Bipolar disorder.  We will continue with her home medications. 3. Diabetes.  I will start on sliding scale insulin and hold the     metformin while she is in the hospital. 4. Hypertension.  Again, blood pressure seems okay at this point will     continue with beta blockers and ACE inhibitors. 5. Hyperlipidemia.  I will check fasting lipid panel and continue with     her current care. 6. History of microcytic anemia.  The patient's hemoglobin is close to     her baseline as per last hospitalization.  Further treatment will     depend on the patient's response to these initial measures.     Lonia Blood, M.D.     Verlin Grills  D:  02/03/2011  T:  02/03/2011  Job:  [id number]  Electronically Signed by Lonia Blood M.D. on 02/26/2011 11:44:11 AM

## 2011-03-02 ENCOUNTER — Ambulatory Visit (HOSPITAL_COMMUNITY): Payer: Self-pay | Attending: Cardiology | Admitting: Radiology

## 2011-03-02 ENCOUNTER — Encounter (HOSPITAL_COMMUNITY): Payer: Self-pay | Admitting: Radiology

## 2011-03-02 DIAGNOSIS — I10 Essential (primary) hypertension: Secondary | ICD-10-CM | POA: Insufficient documentation

## 2011-03-02 DIAGNOSIS — E039 Hypothyroidism, unspecified: Secondary | ICD-10-CM | POA: Insufficient documentation

## 2011-03-02 DIAGNOSIS — I509 Heart failure, unspecified: Secondary | ICD-10-CM | POA: Insufficient documentation

## 2011-03-02 DIAGNOSIS — E119 Type 2 diabetes mellitus without complications: Secondary | ICD-10-CM | POA: Insufficient documentation

## 2011-03-02 DIAGNOSIS — I252 Old myocardial infarction: Secondary | ICD-10-CM | POA: Insufficient documentation

## 2011-03-02 DIAGNOSIS — R0989 Other specified symptoms and signs involving the circulatory and respiratory systems: Secondary | ICD-10-CM

## 2011-03-02 DIAGNOSIS — E669 Obesity, unspecified: Secondary | ICD-10-CM | POA: Insufficient documentation

## 2011-03-02 MED ORDER — TECHNETIUM TC 99M TETROFOSMIN IV KIT
33.0000 | PACK | Freq: Once | INTRAVENOUS | Status: AC | PRN
  Administered 2011-03-02: 33 via INTRAVENOUS

## 2011-03-06 ENCOUNTER — Encounter: Payer: Self-pay | Admitting: Pulmonary Disease

## 2011-03-06 ENCOUNTER — Ambulatory Visit (HOSPITAL_BASED_OUTPATIENT_CLINIC_OR_DEPARTMENT_OTHER): Payer: Self-pay | Attending: Pulmonary Disease

## 2011-03-06 ENCOUNTER — Ambulatory Visit (INDEPENDENT_AMBULATORY_CARE_PROVIDER_SITE_OTHER): Payer: Self-pay | Admitting: Pulmonary Disease

## 2011-03-06 VITALS — BP 132/92 | HR 77 | Temp 97.9°F | Ht 67.0 in | Wt 320.0 lb

## 2011-03-06 DIAGNOSIS — F39 Unspecified mood [affective] disorder: Secondary | ICD-10-CM | POA: Insufficient documentation

## 2011-03-06 DIAGNOSIS — G4733 Obstructive sleep apnea (adult) (pediatric): Secondary | ICD-10-CM

## 2011-03-06 DIAGNOSIS — I10 Essential (primary) hypertension: Secondary | ICD-10-CM | POA: Insufficient documentation

## 2011-03-06 DIAGNOSIS — I509 Heart failure, unspecified: Secondary | ICD-10-CM

## 2011-03-06 DIAGNOSIS — Z79899 Other long term (current) drug therapy: Secondary | ICD-10-CM | POA: Insufficient documentation

## 2011-03-06 HISTORY — DX: Obstructive sleep apnea (adult) (pediatric): G47.33

## 2011-03-06 NOTE — Patient Instructions (Signed)
Will schedule sleep test Will call to schedule follow up after sleep test reviewed 

## 2011-03-06 NOTE — Assessment & Plan Note (Signed)
She missed her dose of lasix today, and has been feeling more short of breath.  I advised her to take her lasix as soon as she gets home.

## 2011-03-06 NOTE — Assessment & Plan Note (Signed)
She has sleep disruption, snoring, and daytime sleepiness.  She has history of hypertension, diabetes, and mood disorder.  I am concerned that she has sleep apnea.  I explained how sleep apnea can affect the patient's health.  Driving precautions and importance of weight loss were discussed.  Treatment options for sleep apnea were reviewed.  To further assess will arrange for in lab sleep study.

## 2011-03-06 NOTE — Progress Notes (Signed)
Subjective:    Patient ID: [name], female    DOB: 1966/05/23, 45 y.o.   MRN: [id number]  HPI 45 yo female for sleep evaluation.  She has a history of heart failure and was recently hospitalized for this.  There was concern that she could have sleep apnea.  As a result sleep consultation was requested.  She snores, and wakes up with a choking sensation.  She will also talk and eat in her sleep w/o being aware of this.    She goes to bed at 10 pm.  She sometimes takes a while to sleep.  She gets anxious and sometimes has to take klonopin.  She wakes up several times during the night.  She gets out of bed at 7 am.  She feels tired.  She is not using anything to help her stay awake.  The patient denies bruxism, or nightmares.  There is no history of restless legs.  The patient denies sleep hallucinations, sleep paralysis, or cataplexy.  She has been gaining weight.  She had thyroid problems before, but is not taking medicine for this.  She does not have sinus problems.  There is no history of smoking or alcohol use.  Past Medical History  Diagnosis Date  . Diabetes mellitus   . Hypertension   . CHF (congestive heart failure)   . Psychosis   . Obesity   . ANEMIA NOS   . ANXIETY DISORDER, GENERALIZED   . DEGENERATIVE JOINT DISEASE, KNEE   . DISORDER, BIPOLAR NOS   . HYPOTHYROIDISM NOS   . OBESITY NOS   . THYROTOXICOSIS      Family History  Problem Relation Age of Onset  . Heart disease Father   . Heart disease Maternal Grandfather   . Heart disease Paternal Grandfather      History   Social History  . Marital Status: Single    Spouse Name: N/A    Number of Children: N/A  . Years of Education: N/A   Occupational History  . unemployed    Social History Main Topics  . Smoking status: Never Smoker   . Smokeless tobacco: Not on file  . Alcohol Use: No  . Drug Use: Not on file  . Sexually Active: Not on file   Other Topics Concern  . Not on file   Social History  Narrative  . No narrative on file     Allergies  Allergen Reactions  . Acetaminophen   . Caffeine   . Lisinopril   . Nsaids     REACTION: shivering     Outpatient Prescriptions Prior to Visit  Medication Sig Dispense Refill  . aspirin 81 MG tablet Take 81 mg by mouth daily.        . clonazePAM (KLONOPIN) 2 MG tablet Take 2 mg by mouth daily.        . fosinopril (MONOPRIL) 40 MG tablet Take 40 mg by mouth daily.        . furosemide (LASIX) 40 MG tablet 2 tabs po bid      . insulin aspart (NOVOLOG) 100 UNIT/ML injection 4 units tid       . insulin glargine (LANTUS) 100 UNIT/ML injection 32 units qd       . labetalol (NORMODYNE) 200 MG tablet Take 2 tablets twice a day  120 tablet  6  . metFORMIN (GLUMETZA) 1000 MG (MOD) 24 hr tablet Take 1,000 mg by mouth 2 (two) times daily.        Marland Kitchen  potassium chloride SA (K-DUR,KLOR-CON) 20 MEQ tablet Take 20 mEq by mouth 2 (two) times daily.        . ARIPiprazole (ABILIFY) 5 MG tablet Take 5 mg by mouth daily.        . divalproex (DEPAKOTE) 500 MG EC tablet daily. 2 tab po qd       Review of Systems     Objective:   Physical Exam  BP 132/92  Pulse 77  Temp(Src) 97.9 F (36.6 C) (Oral)  Ht 5\' 7"  (1.702 m)  Wt 320 lb (145.151 kg)  BMI 50.12 kg/m2  SpO2 100%  General - Obese HEENT - PERRLA, EOMI, No sinus tenderness, MP 3, enlarged tongue, no LAN Cardiac - s1s2 regular with s4, no murmur Chest - basilar rales, no wheeze Abd - obese, soft, nontender Ext - no edema Neuro - normal strength, CN intact Psych - normal mood/behavior     Assessment & Plan:   OSA (obstructive sleep apnea) She has sleep disruption, snoring, and daytime sleepiness.  She has history of hypertension, diabetes, and mood disorder.  I am concerned that she has sleep apnea.  I explained how sleep apnea can affect the patient's health.  Driving precautions and importance of weight loss were discussed.  Treatment options for sleep apnea were reviewed.  To  further assess will arrange for in lab sleep study.  CHF She missed her dose of lasix today, and has been feeling more short of breath.  I advised her to take her lasix as soon as she gets home.    Updated Medication List Outpatient Encounter Prescriptions as of 03/06/2011  Medication Sig Dispense Refill  . ARIPiprazole (ABILIFY) 10 MG tablet Take 10 mg by mouth daily.        Marland Kitchen aspirin 81 MG tablet Take 81 mg by mouth daily.        . clonazePAM (KLONOPIN) 2 MG tablet Take 2 mg by mouth daily.        . divalproex (DEPAKOTE) 500 MG EC tablet Take 500 mg by mouth at bedtime.        . fosinopril (MONOPRIL) 40 MG tablet Take 40 mg by mouth daily.        . furosemide (LASIX) 40 MG tablet 2 tabs po bid      . insulin aspart (NOVOLOG) 100 UNIT/ML injection 4 units tid       . insulin glargine (LANTUS) 100 UNIT/ML injection 32 units qd       . labetalol (NORMODYNE) 200 MG tablet Take 2 tablets twice a day  120 tablet  6  . metFORMIN (GLUMETZA) 1000 MG (MOD) 24 hr tablet Take 1,000 mg by mouth 2 (two) times daily.        . potassium chloride SA (K-DUR,KLOR-CON) 20 MEQ tablet Take 20 mEq by mouth 2 (two) times daily.        Marland Kitchen DISCONTD: ARIPiprazole (ABILIFY) 5 MG tablet Take 5 mg by mouth daily.        Marland Kitchen DISCONTD: divalproex (DEPAKOTE) 500 MG EC tablet daily. 2 tab po qd

## 2011-03-12 ENCOUNTER — Telehealth: Payer: Self-pay | Admitting: *Deleted

## 2011-03-12 NOTE — Telephone Encounter (Signed)
LM THAT PT'S MYOVIEW WAS NORMAL AND MAY CALL IF HAS ANY QUESTIONS./CY

## 2011-03-13 ENCOUNTER — Telehealth: Payer: Self-pay | Admitting: Cardiology

## 2011-03-13 NOTE — Telephone Encounter (Signed)
Test result

## 2011-03-13 NOTE — Telephone Encounter (Signed)
Spoke with pt, aware of myoview results [name]  

## 2011-03-18 ENCOUNTER — Telehealth: Payer: Self-pay | Admitting: Pulmonary Disease

## 2011-03-18 DIAGNOSIS — G4733 Obstructive sleep apnea (adult) (pediatric): Secondary | ICD-10-CM

## 2011-03-18 NOTE — Telephone Encounter (Signed)
PSG 03/06/11>>AHI 76.5, SpO2 low 84%.  CPAP 10 cm H2O.  Results d/w pt over the phone.  Will proceed with setting up CPAP 10 cm H2O (ordered sent to [facility]).  Will have my nurse schedule ROV in 8 weeks after CPAP set up.

## 2011-03-18 NOTE — Procedures (Addendum)
NAMEMACKENNA, KAMER NO.:  [id number]  MEDICAL RECORD NO.:  [id number]          PATIENT TYPE:  OUT  LOCATION:  SLEEP CENTER                 FACILITY:  [facility]  PHYSICIAN:  Coralyn Helling, MD        DATE OF BIRTH:  01/16/1966  DATE OF STUDY:  03/06/2011                           NOCTURNAL POLYSOMNOGRAM  REFERRING PHYSICIAN:  Coralyn Helling, MD  INDICATIONS:  [name] is a 45 year old female who has history of congestive heart failure, diabetes, hypertension, and mood disorder. She also has symptoms of sleep disruption, snoring, and daytime sleepiness.  She is therefore referred to Sleep Lab for evaluation of hypersomnia with obstructive sleep apnea.  Height is 5 feet 8 inches, weight is 320 pounds.  BMI is 29.  Neck size is 15 inches.  MEDICATIONS:  Alprazolam, aspirin, Klonopin, divalproex acid, fosinopril, furosemide, insulin, Lantus, labetalol, metformin and potassium.  EPWORTH SCORE:  15.  SLEEP ARCHITECTURE:  The patient followed a split night study protocol. During the diagnostic portion of the study, total recording time was 144 minutes.  Total sleep time was 131 minutes.  Sleep efficiency was 91%. Sleep latency was 6 minutes.  REM latency was 113 minutes.  This portion of the study was notable for lack of stage III sleep and the patient slept predominately in the non-supine position.  During the titration portion of study, total recording time was 225 minutes.  Total sleep time was 194 minutes.  Sleep efficiency was 86%. Sleep latency was 0 minutes.  REM latency was 57 minutes.  This portion of the study was notable for lack of stage III sleep and she slept exclusively in the non-supine position.  RESPIRATORY DATA:  The average respiratory rate was 18.  Moderate snoring was noted by technician.  During the diagnostic portion of study, the overall apnea/hypopnea index was 76.5.  The events were exclusively obstructive in nature.  During the titration  portion of the study, the patient was started on CPAP at 4 and increased to 12 cm of water.  With CPAP set at 10 cm of water, the apnea/hypopnea index was reduced to 2.  At this pressure setting, she was observed in REM sleep but not supine sleep.  OXYGEN DATA:  The baseline oxygenation was 96%.  The oxygen saturation nadir was 84%.  The study was conducted without the use of supplemental oxygen.  CARDIAC DATA:  The average heart rate was 62 and the rhythm strip showed normal sinus rhythm.  MOVEMENT/PARASOMNIA:  The patient had 1 restroom trip and periodic limb movement is 0.  IMPRESSION:  This study shows evidence for severe obstructive sleep apnea with an apnea/hypopnea index of 76.5.  She had good control of her sleep disordered breathing with CPAP at 10 cm of water.  In addition to diet, exercise, and weight reduction, I would recommend that the patient be started on CPAP and 10 cm of water and monitored for her clinical response.     Coralyn Helling, MD Diplomat, American Board of Sleep Medicine Electronically Signed    VS/MEDQ  D:  03/17/2011 13:50:08  T:  03/18/2011 04:54:31  Job:  [id number]

## 2011-03-28 DIAGNOSIS — Z79899 Other long term (current) drug therapy: Secondary | ICD-10-CM

## 2011-03-28 DIAGNOSIS — I10 Essential (primary) hypertension: Secondary | ICD-10-CM

## 2011-03-28 DIAGNOSIS — I509 Heart failure, unspecified: Secondary | ICD-10-CM

## 2011-03-28 DIAGNOSIS — G4733 Obstructive sleep apnea (adult) (pediatric): Secondary | ICD-10-CM

## 2011-04-03 ENCOUNTER — Encounter: Payer: Self-pay | Admitting: Pulmonary Disease

## 2011-04-08 NOTE — Telephone Encounter (Signed)
[id number] x1 

## 2011-04-14 NOTE — Telephone Encounter (Signed)
[id number] x1 

## 2011-04-14 NOTE — Telephone Encounter (Signed)
Per pt's mother, [facility] has not set up CPAP because pt has no insurance. I will ask the PCC's to check on this for the patient.

## 2011-04-16 NOTE — Telephone Encounter (Signed)
[name], were you able to speak with DME regarding pt's CPAP?

## 2011-04-16 NOTE — Telephone Encounter (Signed)
Spoke with [place] at [facility] and she contacted pt's mother. [facility] had mailed out FEA form which mother did not receive b/c they moved to the apartment downstairs. Mayra Reel advised pt's mother that [facility] will work with her in arranging cpap. FEA form was mailed again to pt's mother and mother was encouraged to complete the form. Once form has been completed and [facility] receives it that she may qualify for [facility] program to provide reduce or free service. [facility] called pt's mother to schedule appointment for set up and mother stated that she would prefer to wait until FEA form has been processed.

## 2011-06-14 ENCOUNTER — Telehealth: Payer: Self-pay | Admitting: Pulmonary Disease

## 2011-06-14 ENCOUNTER — Encounter: Payer: Self-pay | Admitting: Pulmonary Disease

## 2011-06-14 NOTE — Telephone Encounter (Signed)
CPAP 05/10/11 to 06/08/11>>Used 26 of 30 nights with average 45 min.  Average AHI 18 with CPAP 10 cm H2O.  Will have my nurse schedule ROV to discuss status of CPAP therapy for OSA.

## 2011-06-15 NOTE — Telephone Encounter (Signed)
Pt is coming in 10/1 at 9:45

## 2011-06-29 ENCOUNTER — Encounter: Payer: Self-pay | Admitting: Pulmonary Disease

## 2011-06-29 ENCOUNTER — Ambulatory Visit (INDEPENDENT_AMBULATORY_CARE_PROVIDER_SITE_OTHER): Payer: Self-pay | Admitting: Pulmonary Disease

## 2011-06-29 VITALS — BP 140/88 | HR 84 | Temp 98.2°F | Ht 66.0 in | Wt 363.8 lb

## 2011-06-29 DIAGNOSIS — G4733 Obstructive sleep apnea (adult) (pediatric): Secondary | ICD-10-CM

## 2011-06-29 NOTE — Patient Instructions (Signed)
Will increase CPAP setting to 11 cm H2O>>call if you have trouble with change in pressure setting Follow up in 6 months

## 2011-06-29 NOTE — Progress Notes (Signed)
Subjective:    Patient ID: [name], female    DOB: 08-Jun-1966, 45 y.o.   MRN: [id number]  HPI  45 yo female with severe OSA.  She is now using CPAP for about 2 to 4 hours per night.  She feels this helps her sleep and how she feels during the day.  She takes her mask off at night, and then has trouble with her breathing.  She was having some mask leak, and mouth dryness.  She got a new mask, and is using a full face mask.  Past Medical History  Diagnosis Date  . Diabetes mellitus   . Hypertension   . CHF (congestive heart failure)   . Psychosis   . Obesity   . ANEMIA NOS   . ANXIETY DISORDER, GENERALIZED   . DEGENERATIVE JOINT DISEASE, KNEE   . DISORDER, BIPOLAR NOS   . HYPOTHYROIDISM NOS   . OBESITY NOS   . THYROTOXICOSIS   . OSA (obstructive sleep apnea) 03/06/2011     Family History  Problem Relation Age of Onset  . Heart disease Father   . Heart disease Maternal Grandfather   . Heart disease Paternal Grandfather      History   Social History  . Marital Status: Single   Occupational History  . unemployed    Social History Main Topics  . Smoking status: Never Smoker   . Alcohol Use: No   Allergies  Allergen Reactions  . Acetaminophen   . Caffeine   . Lisinopril   . Nsaids     REACTION: shivering     Outpatient Prescriptions Prior to Visit  Medication Sig Dispense Refill  . ARIPiprazole (ABILIFY) 10 MG tablet Take 10 mg by mouth daily.        Marland Kitchen aspirin 81 MG tablet Take 81 mg by mouth daily.        . clonazePAM (KLONOPIN) 2 MG tablet Take 2 mg by mouth daily.        . divalproex (DEPAKOTE) 500 MG EC tablet Take 500 mg by mouth at bedtime.        . fosinopril (MONOPRIL) 40 MG tablet Take 40 mg by mouth daily.        . furosemide (LASIX) 40 MG tablet 2 tabs po bid      . insulin aspart (NOVOLOG) 100 UNIT/ML injection 4 units tid       . insulin glargine (LANTUS) 100 UNIT/ML injection 32 units qd       . labetalol (NORMODYNE) 200 MG tablet Take 2  tablets twice a day  120 tablet  6  . metFORMIN (GLUMETZA) 1000 MG (MOD) 24 hr tablet Take 1,000 mg by mouth 2 (two) times daily.        . potassium chloride SA (K-DUR,KLOR-CON) 20 MEQ tablet Take 20 mEq by mouth 2 (two) times daily.            Review of Systems     Objective:   Physical Exam  BP 140/88  Pulse 84  Temp(Src) 98.2 F (36.8 C) (Oral)  Ht 5\' 6"  (1.676 m)  Wt 363 lb 12.8 oz (165.019 kg)  BMI 58.72 kg/m2  SpO2 97%  General - Obese  HEENT - PERRLA, EOMI, No sinus tenderness, MP 3, enlarged tongue, no LAN  Cardiac - s1s2 regular with s4, no murmur  Chest - basilar rales, no wheeze  Abd - obese, soft, nontender  Ext - no edema  Neuro - normal strength, CN intact  Psych -  normal mood/behavior  PSG 03/06/11>>AHI 76.5, SpO2 low 84%.  CPAP 10 cm H2O. CPAP 05/10/11 to 06/08/11>>Used 26 of 30 nights with average 45 min.  Average AHI 18 with CPAP 10 cm H2O.     Assessment & Plan:   OSA (obstructive sleep apnea) She reports benefit from therapy.  Advised her that she needs to use CPAP for entire time she is asleep to get most benefit. Will increase her setting to 11 cm H2O.  She is to call if she has trouble tolerating this pressure.  Discuss proper mask fit.    Updated Medication List Outpatient Encounter Prescriptions as of 06/29/2011  Medication Sig Dispense Refill  . ARIPiprazole (ABILIFY) 10 MG tablet Take 10 mg by mouth daily.        Marland Kitchen aspirin 81 MG tablet Take 81 mg by mouth daily.        . clonazePAM (KLONOPIN) 2 MG tablet Take 2 mg by mouth daily.        . divalproex (DEPAKOTE) 500 MG EC tablet Take 500 mg by mouth at bedtime.        . fosinopril (MONOPRIL) 40 MG tablet Take 40 mg by mouth daily.        . furosemide (LASIX) 40 MG tablet 2 tabs po bid      . insulin aspart (NOVOLOG) 100 UNIT/ML injection 4 units tid       . insulin glargine (LANTUS) 100 UNIT/ML injection 32 units qd       . labetalol (NORMODYNE) 200 MG tablet Take 2 tablets twice a day  120  tablet  6  . metFORMIN (GLUMETZA) 1000 MG (MOD) 24 hr tablet Take 1,000 mg by mouth 2 (two) times daily.        . potassium chloride SA (K-DUR,KLOR-CON) 20 MEQ tablet Take 20 mEq by mouth 2 (two) times daily.

## 2011-06-29 NOTE — Assessment & Plan Note (Addendum)
She reports benefit from therapy.  Advised her that she needs to use CPAP for entire time she is asleep to get most benefit. Will increase her setting to 11 cm H2O.  She is to call if she has trouble tolerating this pressure.  Discuss proper mask fit.

## 2011-07-17 ENCOUNTER — Emergency Department (HOSPITAL_COMMUNITY): Payer: Self-pay

## 2011-07-17 ENCOUNTER — Inpatient Hospital Stay (HOSPITAL_COMMUNITY)
Admission: EM | Admit: 2011-07-17 | Discharge: 2011-07-21 | DRG: 292 | Disposition: A | Discharge status: Home / Self Care | Payer: Self-pay | Attending: Internal Medicine | Admitting: Internal Medicine

## 2011-07-17 DIAGNOSIS — G4733 Obstructive sleep apnea (adult) (pediatric): Secondary | ICD-10-CM | POA: Diagnosis present

## 2011-07-17 DIAGNOSIS — D509 Iron deficiency anemia, unspecified: Secondary | ICD-10-CM | POA: Diagnosis present

## 2011-07-17 DIAGNOSIS — E1165 Type 2 diabetes mellitus with hyperglycemia: Secondary | ICD-10-CM | POA: Diagnosis present

## 2011-07-17 DIAGNOSIS — Z7982 Long term (current) use of aspirin: Secondary | ICD-10-CM

## 2011-07-17 DIAGNOSIS — E876 Hypokalemia: Secondary | ICD-10-CM | POA: Diagnosis present

## 2011-07-17 DIAGNOSIS — E785 Hyperlipidemia, unspecified: Secondary | ICD-10-CM | POA: Diagnosis present

## 2011-07-17 DIAGNOSIS — F319 Bipolar disorder, unspecified: Secondary | ICD-10-CM | POA: Diagnosis present

## 2011-07-17 DIAGNOSIS — Z794 Long term (current) use of insulin: Secondary | ICD-10-CM

## 2011-07-17 DIAGNOSIS — IMO0002 Reserved for concepts with insufficient information to code with codable children: Secondary | ICD-10-CM | POA: Diagnosis present

## 2011-07-17 DIAGNOSIS — I509 Heart failure, unspecified: Secondary | ICD-10-CM | POA: Diagnosis present

## 2011-07-17 DIAGNOSIS — Z6841 Body Mass Index (BMI) 40.0 and over, adult: Secondary | ICD-10-CM

## 2011-07-17 DIAGNOSIS — I5033 Acute on chronic diastolic (congestive) heart failure: Principal | ICD-10-CM | POA: Diagnosis present

## 2011-07-17 LAB — CARDIAC PANEL(CRET KIN+CKTOT+MB+TROPI): Total CK: 70 U/L (ref 7–177)

## 2011-07-17 LAB — CBC
HCT: 34.4 % — ABNORMAL LOW (ref 36.0–46.0)
MCH: 27.9 pg (ref 26.0–34.0)
MCHC: 32.6 g/dL (ref 30.0–36.0)
MCV: 85.6 fL (ref 78.0–100.0)
RDW: 15.3 % (ref 11.5–15.5)

## 2011-07-17 LAB — COMPREHENSIVE METABOLIC PANEL
AST: 21 U/L (ref 0–37)
CO2: 28 mEq/L (ref 19–32)
Chloride: 97 mEq/L (ref 96–112)
Creatinine, Ser: 0.5 mg/dL (ref 0.50–1.10)
GFR calc non Af Amer: >90 mL/min (ref >90)
Glucose, Bld: 191 mg/dL — ABNORMAL HIGH (ref 70–99)
Total Bilirubin: 0.3 mg/dL (ref 0.3–1.2)

## 2011-07-17 LAB — PRO B NATRIURETIC PEPTIDE: Pro B Natriuretic peptide (BNP): 685.8 pg/mL — ABNORMAL HIGH (ref 0–125)

## 2011-07-17 MED ORDER — IOHEXOL 350 MG/ML SOLN
100.0000 mL | Freq: Once | INTRAVENOUS | Status: AC | PRN
  Administered 2011-07-17: 100 mL via INTRAVENOUS

## 2011-07-18 LAB — BASIC METABOLIC PANEL
BUN: 13 mg/dL (ref 6–23)
BUN: 12 mg/dL (ref 6–23)
CO2: 34 mEq/L — ABNORMAL HIGH (ref 19–32)
Calcium: 9.6 mg/dL (ref 8.4–10.5)
Chloride: 95 mEq/L — ABNORMAL LOW (ref 96–112)
GFR calc Af Amer: >90 mL/min (ref >90)
GFR calc non Af Amer: >90 mL/min (ref >90)
GFR calc non Af Amer: >90 mL/min (ref >90)
Glucose, Bld: 194 mg/dL — ABNORMAL HIGH (ref 70–99)
Glucose, Bld: 199 mg/dL — ABNORMAL HIGH (ref 70–99)
Potassium: 3.8 mEq/L (ref 3.5–5.1)
Sodium: 138 mEq/L (ref 135–145)
Sodium: 137 mEq/L (ref 135–145)

## 2011-07-18 LAB — LIPID PANEL
Cholesterol: 153 mg/dL (ref 0–200)
HDL: 44 mg/dL (ref >39)
Total CHOL/HDL Ratio: 3.5 RATIO
Triglycerides: 269 mg/dL — ABNORMAL HIGH (ref <150)
VLDL: 54 mg/dL — ABNORMAL HIGH (ref 0–40)

## 2011-07-18 LAB — HEMOGLOBIN A1C: Hgb A1c MFr Bld: 7.9 % — ABNORMAL HIGH (ref <5.7)

## 2011-07-18 LAB — CARDIAC PANEL(CRET KIN+CKTOT+MB+TROPI)
CK, MB: 2.4 ng/mL (ref 0.3–4.0)
CK, MB: 2.4 ng/mL (ref 0.3–4.0)
Relative Index: INVALID (ref 0.0–2.5)
Total CK: 77 U/L (ref 7–177)
Total CK: 70 U/L (ref 7–177)
Troponin I: <0.3 ng/mL (ref <0.30)

## 2011-07-18 LAB — GLUCOSE, CAPILLARY
Glucose-Capillary: 133 mg/dL — ABNORMAL HIGH (ref 70–99)
Glucose-Capillary: 225 mg/dL — ABNORMAL HIGH (ref 70–99)

## 2011-07-18 LAB — TSH: TSH: 4.845 u[IU]/mL — ABNORMAL HIGH (ref 0.350–4.500)

## 2011-07-19 LAB — BASIC METABOLIC PANEL
CO2: 33 mEq/L — ABNORMAL HIGH (ref 19–32)
Calcium: 9.5 mg/dL (ref 8.4–10.5)
Chloride: 93 mEq/L — ABNORMAL LOW (ref 96–112)
GFR calc Af Amer: >90 mL/min (ref >90)
Sodium: 135 mEq/L (ref 135–145)

## 2011-07-19 LAB — GLUCOSE, CAPILLARY
Glucose-Capillary: 185 mg/dL — ABNORMAL HIGH (ref 70–99)
Glucose-Capillary: 208 mg/dL — ABNORMAL HIGH (ref 70–99)

## 2011-07-20 LAB — GLUCOSE, CAPILLARY
Glucose-Capillary: 190 mg/dL — ABNORMAL HIGH (ref 70–99)
Glucose-Capillary: 197 mg/dL — ABNORMAL HIGH (ref 70–99)
Glucose-Capillary: 160 mg/dL — ABNORMAL HIGH (ref 70–99)
Glucose-Capillary: 274 mg/dL — ABNORMAL HIGH (ref 70–99)

## 2011-07-20 LAB — BASIC METABOLIC PANEL
BUN: 17 mg/dL (ref 6–23)
CO2: 30 mEq/L (ref 19–32)
Chloride: 95 mEq/L — ABNORMAL LOW (ref 96–112)
GFR calc non Af Amer: >90 mL/min (ref >90)
Glucose, Bld: 185 mg/dL — ABNORMAL HIGH (ref 70–99)
Potassium: 3.6 mEq/L (ref 3.5–5.1)
Sodium: 138 mEq/L (ref 135–145)

## 2011-07-21 ENCOUNTER — Telehealth (HOSPITAL_COMMUNITY): Payer: Self-pay | Admitting: *Deleted

## 2011-07-21 LAB — BASIC METABOLIC PANEL
BUN: 20 mg/dL (ref 6–23)
CO2: 30 mEq/L (ref 19–32)
Calcium: 9.7 mg/dL (ref 8.4–10.5)
Creatinine, Ser: 0.53 mg/dL (ref 0.50–1.10)
GFR calc non Af Amer: >90 mL/min (ref >90)
Glucose, Bld: 279 mg/dL — ABNORMAL HIGH (ref 70–99)

## 2011-07-21 LAB — GLUCOSE, CAPILLARY: Glucose-Capillary: 178 mg/dL — ABNORMAL HIGH (ref 70–99)

## 2011-07-21 NOTE — Telephone Encounter (Signed)
Pt is being d/c'd today, Stanton Kidney, the case manager called and spoke with Arline Asp about  Making an appt for this pt.  Arline Asp made the appt for Thursday, 10/25 at 330pm.  I wanted you to be aware due to the time, to see if you would like it moved up earlier.

## 2011-07-22 NOTE — Telephone Encounter (Signed)
That's fine

## 2011-07-23 ENCOUNTER — Ambulatory Visit (HOSPITAL_BASED_OUTPATIENT_CLINIC_OR_DEPARTMENT_OTHER)
Admit: 2011-07-23 | Discharge: 2011-07-23 | Disposition: A | Discharge status: Home / Self Care | Payer: Self-pay | Attending: Internal Medicine | Admitting: Internal Medicine

## 2011-07-23 VITALS — BP 92/58 | HR 88 | Wt 348.5 lb

## 2011-07-23 DIAGNOSIS — I5032 Chronic diastolic (congestive) heart failure: Secondary | ICD-10-CM

## 2011-07-23 DIAGNOSIS — I509 Heart failure, unspecified: Secondary | ICD-10-CM

## 2011-07-23 DIAGNOSIS — E669 Obesity, unspecified: Secondary | ICD-10-CM

## 2011-07-23 HISTORY — DX: Chronic diastolic (congestive) heart failure: I50.32

## 2011-07-23 MED ORDER — FUROSEMIDE 40 MG PO TABS: 80.0000 mg | ORAL_TABLET | Freq: Two times a day (BID) | ORAL | Status: DC

## 2011-07-23 MED ORDER — POTASSIUM CHLORIDE 10 MEQ PO TBCR: 40.0000 meq | EXTENDED_RELEASE_TABLET | Freq: Two times a day (BID) | ORAL | Status: DC

## 2011-07-23 NOTE — Progress Notes (Signed)
HPI:  [name] is a 45 year old female with chronic diastolic heart failure, hyperlipidemia, uncontrolled diabetes type 2, morbid obesity with a BMI greater than 40, and OSA.    Echo 5/12: Left ventricle: The cavity size was normal. Wall thickness was increased in a pattern of mild LVH. Systolic function was normal. The estimated ejection fraction was in the range of 60% to 65%.  Possible basal inferior hypokinesis. Features are consistent with a pseudonormal left ventricular filling pattern, with concomitant abnormal relaxation and increased filling pressure (grade 2 diastolic dysfunction).  She was admitted to [facility] last week for fluid overload.  She diuresed ~14 kg with improvement in her symptoms of SOB/orthopnea/lower extremity edema.  She was discharged on Lasix 120 mg TID and metolazone.  She was unable to obtain the metolazone from [facility], as well as bisoprolol.  She is now following up in the HF clinic.  She feels really good.  She has been compliant with her lasix.  She denies orthopnea/PND/SOB.  She has not been restricting fluids.  Her lower extremity edema has improved.      ROS: All other systems normal except as mentioned in HPI, past medical history and problem list.    Past Medical History  Diagnosis Date  . Diabetes mellitus   . Hypertension   . CHF (congestive heart failure)   . Psychosis   . Obesity   . ANEMIA NOS   . ANXIETY DISORDER, GENERALIZED   . DEGENERATIVE JOINT DISEASE, KNEE   . DISORDER, BIPOLAR NOS   . HYPOTHYROIDISM NOS   . OBESITY NOS   . THYROTOXICOSIS   . OSA (obstructive sleep apnea) 03/06/2011    Current Outpatient Prescriptions  Medication Sig Dispense Refill  . ARIPiprazole (ABILIFY) 10 MG tablet Take 10 mg by mouth daily.        Marland Kitchen aspirin 325 MG tablet Take 325 mg by mouth daily.        . ferrous sulfate 325 (65 FE) MG tablet Take 325 mg by mouth daily with breakfast.        . fosinopril (MONOPRIL) 40 MG tablet Take 40 mg by mouth daily.         . furosemide (LASIX) 40 MG tablet Take 160 mg by mouth 3 (three) times daily.        . insulin aspart (NOVOLOG) 100 UNIT/ML injection Inject 6 Units into the skin 3 (three) times daily before meals. 4 units tid      . insulin glargine (LANTUS) 100 UNIT/ML injection Inject 30 Units into the skin at bedtime. 32 units qd      . metFORMIN (GLUMETZA) 1000 MG (MOD) 24 hr tablet Take 500 mg by mouth 2 (two) times daily.       . potassium chloride (KLOR-CON) 10 MEQ CR tablet Take 40 mEq by mouth 3 (three) times daily.        . rosuvastatin (CRESTOR) 10 MG tablet Take 10 mg by mouth daily.           Allergies  Allergen Reactions  . Acetaminophen   . Caffeine   . Lisinopril   . Nsaids     REACTION: shivering    History   Social History  . Marital Status: Single    Spouse Name: N/A    Number of Children: N/A  . Years of Education: N/A   Occupational History  . unemployed    Social History Main Topics  . Smoking status: Never Smoker   . Smokeless  tobacco: Not on file  . Alcohol Use: No  . Drug Use: Not on file  . Sexually Active: Not on file   Other Topics Concern  . Not on file   Social History Narrative  . No narrative on file    Family History  Problem Relation Age of Onset  . Heart disease Father   . Heart disease Maternal Grandfather   . Heart disease Paternal Grandfather     PHYSICAL EXAM: Filed Vitals:   07/23/11 1604  BP: 92/58  Pulse: 88   General:  Well appearing. No respiratory difficulty HEENT: normal Neck: supple. flat JVD. Carotids 2+ bilat; no bruits. No lymphadenopathy or thryomegaly appreciated. Cor: PMI nondisplaced. Regular rate & rhythm. No rubs, gallops or murmurs. Lungs: clear Abdomen: obese, soft, nontender, nondistended. No hepatosplenomegaly. No bruits or masses. Good bowel sounds. Extremities: no cyanosis, clubbing, rash, edema Neuro: alert & oriented x 3, cranial nerves grossly intact. moves all 4 extremities w/o difficulty. Affect  pleasant.    ASSESSMENT & PLAN:

## 2011-07-23 NOTE — Patient Instructions (Addendum)
Decrease lasix to 80 mg twice daily.  May increase to 120 mg twice a day if weight is going up.  Decrease potassium to 40 mg twice daily.  Try to stay between 1200 to 1500 calories per day.   Do not take bisoprolol or metolazone.   Call weight watchers and try to start exercising:)  Return for follow up in 3 weeks.   Have labs drawn on Monday.

## 2011-07-23 NOTE — Assessment & Plan Note (Addendum)
Volume status looks good today.  At this time we will decrease lasix 80 mg twice daily, decrease KCl 40 mEq twice daily.  If her weight begins to trend up she can increase her lasix to 120 mg BID.  She is agreeable to this.  Daily weights and diet were discussed at length with the patient and her mom.  There was a long discussion concerning her obesity and a need to lose ~75lbs that she has gained over the last year.  She is agreeable to looking into weight watchers, this has been encouraged.  Also encouraged a 1200 cal/day diet, she is willing to try a 1500 cal/day diet.  We will begin there and discuss again in follow up.  Her BP is soft today so we will hold off on beta blockers as she has diastolic dysfunction.  Labs Monday.

## 2011-07-23 NOTE — Assessment & Plan Note (Signed)
Please see above

## 2011-07-27 ENCOUNTER — Other Ambulatory Visit (INDEPENDENT_AMBULATORY_CARE_PROVIDER_SITE_OTHER): Payer: Self-pay

## 2011-07-27 DIAGNOSIS — I509 Heart failure, unspecified: Secondary | ICD-10-CM

## 2011-07-27 LAB — BASIC METABOLIC PANEL
CO2: 26 mEq/L (ref 19–32)
Calcium: 9.3 mg/dL (ref 8.4–10.5)
Chloride: 98 mEq/L (ref 96–112)
Creatinine, Ser: 0.7 mg/dL (ref 0.4–1.2)
Glucose, Bld: 322 mg/dL — ABNORMAL HIGH (ref 70–99)

## 2011-07-27 NOTE — Discharge Summary (Signed)
[name], [name]               ACCOUNT NO.:  [id number]  MEDICAL RECORD NO.:  [id number]  LOCATION:  4712                         FACILITY:  [facility]  PHYSICIAN:  Altha Harm, MDDATE OF BIRTH:  Mar 22, 1966  DATE OF ADMISSION:  07/17/2011 DATE OF DISCHARGE:  07/21/2011                              DISCHARGE SUMMARY   DISCHARGE DISPOSITION:  Home.  FINAL DISCHARGE DIAGNOSES: 1. Acute on chronic diastolic heart failure. 2. Hyperlipidemia. 3. Uncontrolled diabetes type 2. 4. Hypokalemia, resolved. 5. Morbid obesity with a BMI greater than 40. 6. Iron-deficiency anemia. 7. Bipolar disorder. 8. Obstructive sleep apnea.  DISCHARGE MEDICATIONS:  Include the following; 1. Bisoprolol 5 mg p.o. daily. 2. Lasix 160 mg p.o. t.i.d. x3 days. Then further titration by CHF clinic. 3. Insulin aspart 6 units subcutaneously t.i.d. before meals. 4. Zaroxolyn 2.5 mg p.o. daily. 5. Potassium 40 mEq p.o. t.i.d. 6. Crestor 10 mg p.o. at bedtime. 7. Abilify 5 mg p.o. daily. 8. Aspirin 325 mg p.o. daily. 9. Iron sulfate 325 mg p.o. daily. 10.Lantus 30 units subcutaneously at bedtime. 11.Metformin 500 mg p.o. b.i.d.  DISCONTINUED MEDICATIONS: 1. Amlodipine 5 mg p.o. b.i.d. 2. Ibuprofen 600 mg p.o. q.8 hours.  CONSULTANTS:  None.  PROCEDURES:  None.  DIAGNOSTIC STUDIES:  Chest x-ray done on admission which shows cardiomegaly without acute disease.  Review of records, the patient had a 2D echocardiogram done on May 2012, which showed normal systolic function with a grade 2 diastolic dysfunction.  PRIMARY CARE PHYSICIAN:  [facility]  PRIMARY CARDIOLOGIST:  Madolyn Frieze. Jens Som, MD, [facility].  CHIEF COMPLAINT:  Dyspnea on exertion x3, which has become progressively worse.  Weight gain is 76 pounds in 1 month.  HISTORY OF PRESENT ILLNESS:  [name] is a morbidly obese 45 year old female, who states until the past 2 months her shortness of breath was becoming progressively  worse.  It is got to the point where she is unable to sleep in a chair and she is having increased pedal edema and PND.  The patient admits that she has been noncompliant with her Lasix, owing to the fact that she has some urinary incontinence, and thus does not want to take her Lasix as prescribed.  Thus, the patient has accumulated fluid.  She denies any fever, cough, nausea, vomiting, diarrhea.  The patient had gone to see her primary care provider physicians at [facility], and at that point, she was sent to the emergency room for further evaluation and management.  HOSPITAL COURSE: 1. Acute on chronic diastolic heart failure.  [name] was morbidly     obese.  However, it was clear that she was having swelling which     present as anasarca as well as bilateral crackles.  The patient was     started on aggressive diuretics.  The patient had a significant     urine output with a net urine output of approximately 6 L.  Despite     this, the patient has not had a significant amount of weight loss.     She has gone from 166.2 kilos down to 159.6, which is a loss of 7     kilos, approximately 14 pounds.  The patient continues to have     weight loss and fluid loss with the diuretics while maintaining her     renal function.  The patient is anxious to go home and I am going     to send the patient home on Zaroxolyn as well as oral Lasix.  The     patient should have her electrolytes checked within 48 hours to     ensure that she is still maintaining her renal function within     normal limits.  I have impressed upon the patient the importance of     this, however, she is adamant about going home today.  On     admission, the patient was having significant dyspnea on exertion.     At the time of discharge, she is ambulating without any need for     oxygen or without any increased work of breathing up and down the     hallways.  The patient also on arrival was having paroxysmal      nocturnal dyspnea where she was unable to lay flat.  At this point,     the patient is able to lay flat in the bed without any difficulty.     The patient does have obstructive sleep apnea, and is encouraged to     use her CPAP at night. 2. Diabetes type 2.  The patient's diabetes type 2 is clearly     uncontrolled owing to her poor eating patterns.  Her hemoglobin A1c     on this admission was 7.9.  The patient had a nutrition counsel and     she has been counseled both by myself and by the nursing staff     about maintaining an appropriate diet.  The insulins here have been     adjusted up both for her meal coverage and for her long-acting     insulins.  The patient's blood sugars have been ranging in the 160s     to 190s, thus indicating that she will need further titration of     her medications as an outpatient.  This patient will probably     benefit from an increase in metformin, however, owing to the fact     that she is being diuresed, at this time, I am hesitant to increase     her metformin any further until she is down to her baseline and     then I would recommend at that time, probably increasing her     metformin to a 1000 mg twice a day.  This patient should probably     be on an ACE inhibitor for renal protection as she is a diabetic     and hypertensive, however, the patient indicates that she is     allergic to the Ace inhibitors and did not want to pursue an ARB. 3. Hypertension.  The patient was significantly hypertensive upon     arrival.  She had been on Norvasc.  I discontinue the Norvasc owing     to the fluid retention associated with Norvasc and the patient's     propensity for not being compliant with her Lasix.  I put the     patient instead of bisoprolol instead of an ACE inhibitor ARB     because she said she has a significant allergy to ACE inhibitors     and was reluctant to try an ACE receptor blocker, although, I     explained to  her that they were not  in the same class.  However on     the bisoprolol 5 mg, the patient's blood pressures have been     ranging in the 130s to 80s, although not at goal, is markedly     improved from admission. 4. Hypokalemia.  The patient was hypokalemic likely due to the     aggressive diuresis, and she is being sent home on potassium 3     times a day. 5. BMI greater than 40 with marked obesity.  The patient has been     counseled to engage in exercise program and also to monitor her     eating as per the dietary consult. 6. Urinary incontinence.  The patient describes stress urinary     incontinence, which is #1 very embarrassing to her as a young     woman, however, also due to the urinary incontinence, the patient     is reluctant to take her diuretics as prescribed.  Thus, it may be     prudent to refer this patient for Urology consult as it can the     effects of her incontinence as she pulls life-threatening behaviors     to her in terms of the use of her diuretic.  PHYSICAL EXAMINATION:  GENERAL:  At the time of discharge, the patient is stable. VITAL SIGNS:  Temperature is 98.2, heart rate 89, respirations 17, blood pressure 130/80, O2 sats are 98% on room air. HEENT:  Normocephalic, atraumatic.  Pupils are equally round, reactive to light, and accommodation.  Extraocular movements are intact. Oropharynx is moist.  No exudate, erythema, or lesions are noted. Neck:  Trachea is midline.  No masses.  No thyromegaly.  I cannot appreciate any JVD or bruit because the patient has an obese neck. RESPIRATORY:  The patient has a normal respiratory effort.  She has got equal excursion bilaterally.  There is no wheezing or rhonchi noted. CARDIOVASCULAR:  She has got a normal S1, S2.  No murmurs, rubs, or gallops are noted.  PMI is nondisplaced.  No heaves or thrills on palpation. ABDOMEN:  Obese, soft, nontender, nondistended.  No masses.  No hepatosplenomegaly noted. EXTREMITIES:  Shows only 1+ edema,  but however the patient's lower extremities are obese. NEUROLOGICAL:  The patient has no focal neurological deficits.  Cranial nerves II through XII are grossly intact. PSYCHIATRIC:  She is alert and oriented x3.  Good insight and cognition. Good recent and remote recall.  FOLLOWUP:  The patient is to follow up with CHF clinic within 48 hours to have her labs checked and she has a followup appointment scheduled with [facility] on December 12 at 2:30.     Altha Harm, MD     MAM/MEDQ  D:  07/21/2011  T:  07/21/2011  Job:  [id number]  cc:   Madolyn Frieze. Jens Som, MD, [facility]  Electronically Signed by Marthann Schiller MD on 07/27/2011 01:21:38 PM

## 2011-07-27 NOTE — H&P (Signed)
Vanessa, [name] NO.:  [id number]  MEDICAL RECORD NO.:  [id number]  LOCATION:  4712                         FACILITY:  [facility]  PHYSICIAN:  Altha Harm, MDDATE OF BIRTH:  07-Oct-1965  DATE OF ADMISSION:  07/17/2011 DATE OF DISCHARGE:                             HISTORY & PHYSICAL   CHIEF COMPLAINT:  Dyspnea on exertion x3 months which has become progressively worse.  The patient also has had a weight gain of 76 pounds in 1 month.  HISTORY OF PRESENT ILLNESS:  [name] is a morbidly obese 45 year old female who states that for the past 3 months she has been having progressively worsening exertional dyspnea.  The patient also states that she has noticed swelling in her face and her legs and her abdomen. She states that she has been sleeping in a sitting up position due to the PND.  She also states that she went from a weight of 317 pounds to 376 pounds within a 110-month period.  She went to see her doctor at [facility] and was sent to the emergency room for further evaluation and management.  The patient states that she has not been taking Lasix on a regular basis as she has been instructed to take it only when she has swelling of her legs.  She has had no fever or chills.  No nausea, vomiting, or diarrhea.  She has had no frequency.  No dysuria.  No urgency.  She has had no loss of consciousness.  No seizure disorder. No syncope.  She has had no cough and no chest pain.  PAST MEDICAL HISTORY:  Significant for: 1. Obstructive sleep apnea. 2. Chronic diastolic dysfunction. 3. Diabetes type 2. 4. Hypertension. 5. Hyperlipidemia. 6. Bipolar disorder.  FAMILY HISTORY:  The patient is unable to provide any family history at this time.  SOCIAL HISTORY:  The patient is of [place] origin and lives with her mother and brother.  There is no tobacco, alcohol, or drug use.  She is unemployed.  ALLERGIES TO MEDICATIONS: 1. ACETAMINOPHEN. 2.  LISINOPRIL.  CURRENT MEDICATIONS: 1. Abilify 5 mg p.o. daily. 2. Metformin 500 mg p.o. b.i.d. 3. Norvasc 5 mg p.o. b.i.d. 4. Insulin aspart 4 units a.c. meals t.i.d. 5. Lantus 30 units at bedtime. 6. Iron sulfate 325 mg p.o. daily. 7. Lasix 80 mg p.o. b.i.d. for which the patient says she has been     noncompliant. 8. Aspirin 325 mg p.o. daily. 9. Potassium chloride 20 mEq p.o. daily. 10.Ibuprofen 600 mg p.o. q.8 h. p.r.n.  PRIMARY CARE PHYSICIAN:  At [facility].  CARDIOLOGIST:  Dr. Jens Som, [facility].  REVIEW OF SYSTEMS:  All other systems negative except as in the HPI.  Studies in the emergency room, hemogram shows a white blood cell count of 6.4, hemoglobin of 11.2, hematocrit of 34.4, platelet count of 238, potassium 3.7, sodium 137, chloride 97, bicarb 28, BUN 13, creatinine 0.5.  D-dimer is mildly elevated at 0.51.  BNP is 685.8.  Chest x-ray shows cardiomegaly without acute disease.  PHYSICAL EXAMINATION:  GENERAL:  The patient is a morbidly obese female sitting in the bed.  She has difficulty laying back secondary to her dyspnea  on exertion.  Any small movement causes the patient to have dyspnea on exertion. VITAL SIGNS:  Temperature is 97.6, heart rate 79, blood pressure 126/71, respiratory rate 18 at rest but goes up to about 24 with movement.  O2 sats are 100% on 4 L and 92% on room air. HEENT:  The patient is normocephalic, atraumatic.  Pupils are equally round and reactive to light and accommodation.  Extraocular movements are intact.  Oropharynx is moist.  No exudate, erythema, or lesions are noted. NECK:  Trachea is midline.  Neck is morbidly obese.  I cannot appreciate any masses or any thyromegaly.  Unable to really evaluate JVD due to the obesity of the neck. RESPIRATORY:  The patient has accessory muscle use with breathing. Respiratory exam reveals crackles in the bases of both lungs.  There is mild expiratory wheezing. CARDIOVASCULAR:  She  has got a normal S1 and S2.  No murmurs, rubs, or gallops are noted.  PMI is nondisplaced.  No heaves or thrills on palpation.  ABDOMEN:  Obese.  There appears to be fluid in the abdominal wall.  It is nontender.  No masses.  No hepatosplenomegaly. EXTREMITIES:  The patient has anasarca with swelling throughout the entire lower extremity.  However there is no weeping of fluid. LYMPH NODE SURVEY:  She has got no cervical, axillary, or inguinal lymphadenopathy noted. PSYCHIATRIC:  She is alert and oriented x3.  Good insight and cognition. Good recent and remote recall.  ASSESSMENT AND PLAN:  This patient presents with: 1. Acute-on-chronic diastolic heart failure.  The patient has been     noncompliant with the use of Lasix and, according to her, has not     been compliant with her diet either.  The patient will be placed on     a heart healthy diet and she will be aggressively diuresed.  We     will give the patient supportive care and reassess the patient     tomorrow for effects of diuresis. 2. Diabetes type 2.  We will check a hemoglobin A1c.  Put the patient     on a sliding scale and Lantus.  In terms of the anasarca, the patient will be diuresed and we will assess whether or not she     needs any colloids to assist with diuresis.  The patient will be     pulled back on her usual medications except for the Norvasc which I     will     hold at this time due to fluid retention properties of Norvasc.     Instead, I will treat the patient with bisoprolol 5 mg p.o. daily     and we will reassess the patient tomorrow.  Further therapeutic     approach will be based on results of the initial testing, response     to therapy.     Altha Harm, MD     MAM/MEDQ  D:  07/19/2011  T:  07/19/2011  Job:  [id number]  cc:   Madolyn Frieze. Jens Som, MD, [facility]  Electronically Signed by Marthann Schiller MD on 07/27/2011 01:20:03 PM

## 2011-08-11 ENCOUNTER — Ambulatory Visit (HOSPITAL_COMMUNITY)
Admission: RE | Admit: 2011-08-11 | Discharge: 2011-08-11 | Disposition: A | Discharge status: Home / Self Care | Payer: Self-pay | Source: Ambulatory Visit | Attending: Internal Medicine | Admitting: Internal Medicine

## 2011-08-11 DIAGNOSIS — E669 Obesity, unspecified: Secondary | ICD-10-CM

## 2011-08-11 DIAGNOSIS — E119 Type 2 diabetes mellitus without complications: Secondary | ICD-10-CM

## 2011-08-11 DIAGNOSIS — I509 Heart failure, unspecified: Secondary | ICD-10-CM

## 2011-08-11 DIAGNOSIS — I5032 Chronic diastolic (congestive) heart failure: Secondary | ICD-10-CM

## 2011-08-11 MED ORDER — FOSINOPRIL SODIUM 40 MG PO TABS: 40.0000 mg | ORAL_TABLET | Freq: Every day | ORAL | Status: DC

## 2011-08-11 MED ORDER — TORSEMIDE 20 MG PO TABS: ORAL_TABLET | ORAL | Status: DC

## 2011-08-11 MED ORDER — INSULIN ASPART 100 UNIT/ML ~~LOC~~ SOLN: 10.0000 [IU] | Freq: Three times a day (TID) | SUBCUTANEOUS | Status: DC

## 2011-08-11 NOTE — Assessment & Plan Note (Signed)
Discussed need to decrease intake to lose weight. Limit fluid intake 2 liters per day. Encouraged to try weight watchers.

## 2011-08-11 NOTE — Patient Instructions (Addendum)
Stop Lasix  Take Demadex 60 mg in am and pm  Increase Novolog 10 units before meals if you skip a meal do not take   Restart fosinopril 40 mg daily  Continue weigh and record weights  Limit fluid intake 2 liters per day  Follow next week.

## 2011-08-11 NOTE — Assessment & Plan Note (Addendum)
NYHA III-IIIB. Volume status remains elevated. Weight increased 100  pounds over the last year.  Stop Lasix because I do not think she is absorbing. Start Demadex 60 mg bid. Re-ordered Fosinopril and instructed to call HF clinic if she runs out of her medications.  Discussed limiting fluid to 2 liter per day. Will check BMET next week. Follow up next week.

## 2011-08-11 NOTE — Progress Notes (Signed)
HPI:  [name] is a 45 year old female with chronic diastolic heart failure, hyperlipidemia, uncontrolled diabetes type 2, morbid obesity with a BMI greater than 40, and OSA.    Echo 5/12: Left ventricle: The cavity size was normal. Wall thickness was increased in a pattern of mild LVH. Systolic function was normal. The estimated ejection fraction was in the range of 60% to 65%.  Possible basal inferior hypokinesis. Features are consistent with a pseudonormal left ventricular filling pattern, with concomitant abnormal relaxation and increased filling pressure (grade 2 diastolic dysfunction).  She was admitted to [facility] last week for fluid overload.  She diuresed ~14 kg with improvement in her symptoms of SOB/orthopnea/lower extremity edema.  She was discharged on Lasix 120 mg TID and metolazone.  She was unable to obtain the metolazone from [facility], as well as bisoprolol.    She is here for follow up. Last visit Lasix decreased but she had to return to 240 mg of Lasix daily.  She has not been taking Ace Inhibitor for 10 day. SOB at rest and on exertion. She has a follow up appointment with [facility] 09/09/11. She is not limiting fluid intake. Tries to follow low salt diet. She has a tremendous appetite and she does not limit intake.    ROS: All other systems normal except as mentioned in HPI, past medical history and problem list.    Past Medical History  Diagnosis Date  . Diabetes mellitus   . Hypertension   . CHF (congestive heart failure)   . Psychosis   . Obesity   . ANEMIA NOS   . ANXIETY DISORDER, GENERALIZED   . DEGENERATIVE JOINT DISEASE, KNEE   . DISORDER, BIPOLAR NOS   . HYPOTHYROIDISM NOS   . OBESITY NOS   . THYROTOXICOSIS   . OSA (obstructive sleep apnea) 03/06/2011    Current Outpatient Prescriptions  Medication Sig Dispense Refill  . ARIPiprazole (ABILIFY) 10 MG tablet Take 10 mg by mouth daily.        Marland Kitchen aspirin 325 MG tablet Take 325 mg by mouth daily.          . ferrous sulfate 325 (65 FE) MG tablet Take 325 mg by mouth daily with breakfast.        . fosinopril (MONOPRIL) 40 MG tablet Take 40 mg by mouth daily.        . furosemide (LASIX) 40 MG tablet Take 120 mg by mouth 2 (two) times daily.        . insulin aspart (NOVOLOG) 100 UNIT/ML injection Inject 6 Units into the skin 3 (three) times daily before meals. 4 units tid      . insulin glargine (LANTUS) 100 UNIT/ML injection Inject 30 Units into the skin at bedtime.       . metFORMIN (GLUMETZA) 1000 MG (MOD) 24 hr tablet Take 1,000 mg by mouth daily.       . potassium chloride (KLOR-CON) 10 MEQ CR tablet Take 4 tablets (40 mEq total) by mouth 2 (two) times daily.      . rosuvastatin (CRESTOR) 10 MG tablet Take 10 mg by mouth daily.           Allergies  Allergen Reactions  . Acetaminophen     Fits as a child "seizures-like"  . Caffeine     Tense, anxiety, increased urination  . Lisinopril     Rash with lisinopril; but fosinopril is ok    History   Social History  . Marital Status:  Single    Spouse Name: N/A    Number of Children: N/A  . Years of Education: N/A   Occupational History  . unemployed    Social History Main Topics  . Smoking status: Never Smoker   . Smokeless tobacco: Not on file  . Alcohol Use: No  . Drug Use: Not on file  . Sexually Active: Not on file   Other Topics Concern  . Not on file   Social History Narrative  . No narrative on file    Family History  Problem Relation Age of Onset  . Heart disease Father   . Heart disease Maternal Grandfather   . Heart disease Paternal Grandfather     PHYSICAL EXAM: Filed Vitals:   08/11/11 1455  BP: 162/80  Pulse: 110  Weight 360 (348) General:  Well appearing.  No respiratory difficulty HEENT: normal Neck: supple. Difficult to assess Carotids 2+ bilat; no bruits. No lymphadenopathy or thryomegaly appreciated. Cor: PMI nondisplaced. Regular rate & rhythm. No rubs, gallops or murmurs. Lungs:  clear Abdomen: obese, soft, nontender, nondistended. No hepatosplenomegaly. No bruits or masses. Good bowel sounds. Extremities: no cyanosis, clubbing, rash, 3+ edema Neuro: alert & oriented x 3, cranial nerves grossly intact. moves all 4 extremities w/o difficulty. Affect pleasant.    ASSESSMENT & PLAN:

## 2011-08-11 NOTE — Assessment & Plan Note (Signed)
Home glucose 300-400 after meals. Will increase novolog 10 units before meals.

## 2011-08-12 ENCOUNTER — Telehealth (HOSPITAL_COMMUNITY): Payer: Self-pay | Admitting: *Deleted

## 2011-08-12 NOTE — Telephone Encounter (Signed)
Patient called and prescription for Fosinopril was not at [facility], patient thought it was going to be called in by Amy.  08/12/2011 cam

## 2011-08-12 NOTE — Telephone Encounter (Signed)
Pt states [facility] did not have prescription, rx called in

## 2011-08-17 ENCOUNTER — Encounter (HOSPITAL_COMMUNITY): Payer: Self-pay

## 2011-08-17 ENCOUNTER — Ambulatory Visit (HOSPITAL_COMMUNITY)
Admission: RE | Admit: 2011-08-17 | Discharge: 2011-08-17 | Disposition: A | Discharge status: Home / Self Care | Payer: Self-pay | Source: Ambulatory Visit | Attending: Internal Medicine | Admitting: Internal Medicine

## 2011-08-17 VITALS — BP 146/96 | HR 100 | Wt 355.5 lb

## 2011-08-17 DIAGNOSIS — I509 Heart failure, unspecified: Secondary | ICD-10-CM

## 2011-08-17 DIAGNOSIS — I5032 Chronic diastolic (congestive) heart failure: Secondary | ICD-10-CM | POA: Insufficient documentation

## 2011-08-17 LAB — BASIC METABOLIC PANEL
BUN: 11 mg/dL (ref 6–23)
CO2: 28 mEq/L (ref 19–32)
Calcium: 8.7 mg/dL (ref 8.4–10.5)
GFR calc non Af Amer: >90 mL/min (ref >90)
Glucose, Bld: 219 mg/dL — ABNORMAL HIGH (ref 70–99)
Potassium: 4.3 mEq/L (ref 3.5–5.1)
Sodium: 139 mEq/L (ref 135–145)

## 2011-08-17 NOTE — Assessment & Plan Note (Addendum)
NYHA III. Volume status much improved. Continue current medical regimen. Will check BMET. May need to decrease Demadex based on BMET results. Encouraged to walk daily. Re-educated regarding low salt and fluid intake. Follow up in two weeks.   Patient seen and examined with Tonye Becket, NP. We discussed all aspects of the encounter. I agree with the assessment and plan as stated above. We spent at least 25 minutes discussing her diet and the absolute need to lose weight before it becomes a life-threatening situation.

## 2011-08-17 NOTE — Patient Instructions (Signed)
Follow-up in 2 weeks

## 2011-08-17 NOTE — Progress Notes (Signed)
HPI:  Ms. Remlinger is a 45 year old female with chronic diastolic heart failure, hyperlipidemia, uncontrolled diabetes type 2, morbid obesity with a BMI greater than 40, and OSA.    Echo 5/12: Left ventricle: The cavity size was normal. Wall thickness was increased in a pattern of mild LVH. Systolic function was normal. The estimated ejection fraction was in the range of 60% to 65%.  Possible basal inferior hypokinesis. Features are consistent with a pseudonormal left ventricular filling pattern, with concomitant abnormal relaxation and increased filling pressure (grade 2 diastolic dysfunction).  She was admitted to [facility] last week for fluid overload.  She diuresed ~14 kg with improvement in her symptoms of SOB/orthopnea/lower extremity edema.  She was discharged on Lasix 120 mg TID and metolazone.  She was unable to obtain the metolazone from [facility], as well as bisoprolol.    She is here for follow up. Breathing better. She is not sob at rest. Last visit changed to Demadex 60 mg bid and fosinopril re-orderd. She has not been weighing at home because she does not have scale. SOB on exertion. No PND/orthpnea. Denies dizziness.  She is trying walk 1/2 mile a day. She has a follow up appointment with [facility] 09/09/11. She is trying to limit  fluid intake. Tries to follow low salt diet.  Blood sugar at home 200s.    ROS: All other systems normal except as mentioned in HPI, past medical history and problem list.    Past Medical History  Diagnosis Date  . Diabetes mellitus   . Hypertension   . CHF (congestive heart failure)   . Psychosis   . Obesity   . ANEMIA NOS   . ANXIETY DISORDER, GENERALIZED   . DEGENERATIVE JOINT DISEASE, KNEE   . DISORDER, BIPOLAR NOS   . HYPOTHYROIDISM NOS   . OBESITY NOS   . THYROTOXICOSIS   . OSA (obstructive sleep apnea) 03/06/2011    Current Outpatient Prescriptions  Medication Sig Dispense Refill  . ARIPiprazole (ABILIFY) 10 MG tablet Take 10 mg by  mouth daily.        Marland Kitchen aspirin 325 MG tablet Take 325 mg by mouth daily.        . ferrous sulfate 325 (65 FE) MG tablet Take 325 mg by mouth daily with breakfast.        . fosinopril (MONOPRIL) 40 MG tablet Take 1 tablet (40 mg total) by mouth daily.  30 tablet  6  . insulin aspart (NOVOLOG) 100 UNIT/ML injection Inject 10 Units into the skin 3 (three) times daily before meals. 4 units tid  1 vial  6  . insulin glargine (LANTUS) 100 UNIT/ML injection Inject 30 Units into the skin at bedtime.       . metFORMIN (GLUMETZA) 1000 MG (MOD) 24 hr tablet Take 1,000 mg by mouth daily.       . potassium chloride (KLOR-CON) 10 MEQ CR tablet Take 4 tablets (40 mEq total) by mouth 2 (two) times daily.      . rosuvastatin (CRESTOR) 10 MG tablet Take 10 mg by mouth daily.        Marland Kitchen torsemide (DEMADEX) 20 MG tablet Take 3 tablets in am and pm  180 tablet  6     Allergies  Allergen Reactions  . Acetaminophen     Fits as a child "seizures-like"  . Caffeine     Tense, anxiety, increased urination  . Lisinopril     Rash with lisinopril; but fosinopril is  ok per patient    History   Social History  . Marital Status: Single    Spouse Name: N/A    Number of Children: N/A  . Years of Education: N/A   Occupational History  . unemployed    Social History Main Topics  . Smoking status: Never Smoker   . Smokeless tobacco: Not on file  . Alcohol Use: No  . Drug Use: Not on file  . Sexually Active: Not on file   Other Topics Concern  . Not on file   Social History Narrative  . No narrative on file    Family History  Problem Relation Age of Onset  . Heart disease Father   . Heart disease Maternal Grandfather   . Heart disease Paternal Grandfather     PHYSICAL EXAM: Filed Vitals:   08/17/11 1202  BP: 146/96  Pulse: 100  Weight 355  (360) General:  Well appearing.  No respiratory difficulty. Mom present HEENT: normal Neck: supple. Difficult to assess Carotids 2+ bilat; no bruits. No  lymphadenopathy or thryomegaly appreciated. Cor: PMI nondisplaced. Regular rate & rhythm. No rubs, gallops or murmurs. Lungs: clear Abdomen: obese, soft, nontender, nondistended. No hepatosplenomegaly. No bruits or masses. Good bowel sounds. Extremities: no cyanosis, clubbing, rash, edema Neuro: alert & oriented x 3, cranial nerves grossly intact. moves all 4 extremities w/o difficulty. Affect pleasant.    ASSESSMENT & PLAN:

## 2011-08-20 NOTE — Progress Notes (Signed)
Patient seen and examined with Amy Clegg, NP. We discussed all aspects of the encounter. I agree with the assessment and plan as stated below.   

## 2011-08-20 NOTE — Progress Notes (Signed)
Patient seen and examined with Amy Clegg, NP. We discussed all aspects of the encounter. I agree with the assessment and plan as stated above.   

## 2011-09-01 ENCOUNTER — Ambulatory Visit (HOSPITAL_COMMUNITY)
Admission: RE | Admit: 2011-09-01 | Discharge: 2011-09-01 | Disposition: A | Discharge status: Home / Self Care | Payer: Self-pay | Source: Ambulatory Visit | Attending: Internal Medicine | Admitting: Internal Medicine

## 2011-09-01 VITALS — BP 110/64 | HR 94 | Wt 351.2 lb

## 2011-09-01 DIAGNOSIS — I509 Heart failure, unspecified: Secondary | ICD-10-CM

## 2011-09-01 DIAGNOSIS — E669 Obesity, unspecified: Secondary | ICD-10-CM

## 2011-09-01 DIAGNOSIS — I5032 Chronic diastolic (congestive) heart failure: Secondary | ICD-10-CM | POA: Insufficient documentation

## 2011-09-01 LAB — BASIC METABOLIC PANEL
BUN: 18 mg/dL (ref 6–23)
Chloride: 96 mEq/L (ref 96–112)
Creatinine, Ser: 0.57 mg/dL (ref 0.50–1.10)
GFR calc Af Amer: >90 mL/min (ref >90)
Glucose, Bld: 160 mg/dL — ABNORMAL HIGH (ref 70–99)
Potassium: 4 mEq/L (ref 3.5–5.1)

## 2011-09-01 NOTE — Patient Instructions (Addendum)
Follow up in 3 weeks  Stay physically active! Staying active will give you more energy and make your muscles stronger. Start with 5 minutes at a time and work your way up to 30 minutes a day. Break up your activities--do some in the morning and some in the afternoon. Start with 3 days per week and work your way up to 5 days as you can.  If you have chest pain, feel short of breath, dizzy, or lightheaded, STOP. If you don't feel better after a short rest, call 911. If you do feel better, call the heart failure clinic to let them know you have symptoms with exercise.  Do the following things EVERYDAY: 1) Weigh yourself in the morning before breakfast. Write it down and keep it in a log. 2) Take your medicines as prescribed 3) Eat low salt foods-Limit salt (sodium) to 2000mg  per day.  4) Stay as active as you can everyday

## 2011-09-01 NOTE — Assessment & Plan Note (Addendum)
Encouraged to exercise 15 minutes daily and restrict calories. With goal of at least 0.5-1 pound weight loss per week.

## 2011-09-01 NOTE — Progress Notes (Signed)
Patient ID: [name], female   DOB: [birth year], 45 y.o.   MRN: [id number]   HPI:  Ms. Kimmer is a 45 year old female with chronic diastolic heart failure, hyperlipidemia, uncontrolled diabetes type 2, morbid obesity with a BMI greater than 40, and OSA.    Echo 5/12: Left ventricle: The cavity size was normal. Wall thickness was increased in a pattern of mild LVH. Systolic function was normal. The estimated ejection fraction was in the range of 60% to 65%.  Possible basal inferior hypokinesis. Features are consistent with a pseudonormal left ventricular filling pattern, with concomitant abnormal relaxation and increased filling pressure (grade 2 diastolic dysfunction).  She was admitted to [facility] last week for fluid overload.  She diuresed ~14 kg with improvement in her symptoms of SOB/orthopnea/lower extremity edema.  She was discharged on Lasix 120 mg TID and metolazone.  She was unable to obtain the metolazone from [facility], as well as bisoprolol.    She is here for follow up. Complains of dry mouth. Complains of diarrhea. Breathing better. She is not sob at rest. She continues on  Demadex 60 mg bid. She has been weighing at home (351-352) SOB on exertion. No PND/orthpnea. Denies dizziness. She is not exercising.  She has a follow up appointment with [facility] 09/09/11. She is trying to limit  fluid intake. Tries to follow low salt diet.  Blood sugar at home 200s.     ROS: All other systems normal except as mentioned in HPI, past medical history and problem list.    Past Medical History  Diagnosis Date  . Diabetes mellitus   . Hypertension   . CHF (congestive heart failure)   . Psychosis   . Obesity   . ANEMIA NOS   . ANXIETY DISORDER, GENERALIZED   . DEGENERATIVE JOINT DISEASE, KNEE   . DISORDER, BIPOLAR NOS   . HYPOTHYROIDISM NOS   . OBESITY NOS   . THYROTOXICOSIS   . OSA (obstructive sleep apnea) 03/06/2011    Current Outpatient Prescriptions  Medication Sig Dispense  Refill  . ARIPiprazole (ABILIFY) 10 MG tablet Take 10 mg by mouth daily.        Marland Kitchen aspirin 81 MG tablet Take 81 mg by mouth daily.        . ferrous sulfate 325 (65 FE) MG tablet Take 325 mg by mouth daily with breakfast.        . fosinopril (MONOPRIL) 40 MG tablet Take 1 tablet (40 mg total) by mouth daily.  30 tablet  6  . insulin aspart (NOVOLOG) 100 UNIT/ML injection Inject 10 Units into the skin 3 (three) times daily before meals. 4 units tid  1 vial  6  . insulin glargine (LANTUS) 100 UNIT/ML injection Inject 30 Units into the skin at bedtime.       . metFORMIN (GLUMETZA) 1000 MG (MOD) 24 hr tablet Take 1,000 mg by mouth 2 (two) times daily with a meal.       . potassium chloride (KLOR-CON) 10 MEQ CR tablet Take 4 tablets (40 mEq total) by mouth 2 (two) times daily.      . rosuvastatin (CRESTOR) 10 MG tablet Take 10 mg by mouth daily.        Marland Kitchen torsemide (DEMADEX) 20 MG tablet Take 3 tablets  Twice daily           Allergies  Allergen Reactions  . Acetaminophen     Fits as a child "seizures-like"  . Caffeine  Tense, anxiety, increased urination  . Lisinopril     Rash with lisinopril; but fosinopril is ok per patient    History   Social History  . Marital Status: Single    Spouse Name: N/A    Number of Children: N/A  . Years of Education: N/A   Occupational History  . unemployed    Social History Main Topics  . Smoking status: Never Smoker   . Smokeless tobacco: Not on file  . Alcohol Use: No  . Drug Use: Not on file  . Sexually Active: Not on file   Other Topics Concern  . Not on file   Social History Narrative  . No narrative on file    Family History  Problem Relation Age of Onset  . Heart disease Father   . Heart disease Maternal Grandfather   . Heart disease Paternal Grandfather     PHYSICAL EXAM: Filed Vitals:   09/01/11 1102  BP: 110/64  Pulse: 94  Weight 351 (355)   General:  Well appearing.  No respiratory difficulty. Mom present HEENT:  normal Neck: supple. JVP 10 Difficult to assess Carotids 2+ bilat; no bruits. No lymphadenopathy or thryomegaly appreciated. Cor: PMI nondisplaced. Regular rate & rhythm. No rubs, gallops or murmurs. Lungs: Decreased in the bases.  Abdomen: obese, soft, nontender, nondistended. No hepatosplenomegaly. No bruits or masses. Good bowel sounds. Extremities: no cyanosis, clubbing, rash, edema Neuro: alert & oriented x 3, cranial nerves grossly intact. moves all 4 extremities w/o difficulty. Affect pleasant.    ASSESSMENT & PLAN:

## 2011-09-01 NOTE — Assessment & Plan Note (Addendum)
NYHA III. Volume status improving. Check BMET due to diarrhea.  Continue to encourage to exercise. Follow up 5  weeks.   Patient seen and examined with Tonye Becket, NP. We discussed all aspects of the encounter. I agree with the assessment and plan as stated above. Volume status much improved. Main issue now will continue to be dietary restriction and weight loss which we reviewed extensively. Set a goal of 0.5-1 pound per week weight loss.

## 2011-09-07 NOTE — Progress Notes (Signed)
Patient seen and examined with Amy Clegg, NP. We discussed all aspects of the encounter. I agree with the assessment and plan as stated above.   

## 2011-09-18 ENCOUNTER — Ambulatory Visit (HOSPITAL_COMMUNITY): Payer: Self-pay

## 2011-10-05 ENCOUNTER — Ambulatory Visit (HOSPITAL_COMMUNITY)
Admission: RE | Admit: 2011-10-05 | Discharge: 2011-10-05 | Disposition: A | Discharge status: Home / Self Care | Payer: Self-pay | Source: Ambulatory Visit | Attending: Internal Medicine | Admitting: Internal Medicine

## 2011-10-05 VITALS — BP 142/94 | HR 98 | Wt 358.5 lb

## 2011-10-05 DIAGNOSIS — E669 Obesity, unspecified: Secondary | ICD-10-CM | POA: Insufficient documentation

## 2011-10-05 DIAGNOSIS — I5032 Chronic diastolic (congestive) heart failure: Secondary | ICD-10-CM | POA: Insufficient documentation

## 2011-10-05 DIAGNOSIS — I509 Heart failure, unspecified: Secondary | ICD-10-CM

## 2011-10-05 NOTE — Progress Notes (Signed)
Patient ID: [name], female   DOB: [birth year], 46 y.o.   MRN: [id number] Patient ID: [name], female   DOB: Jun 01, 1966, 46 y.o.   MRN: [id number]   HPI:  [name] is a 46 year old female with chronic diastolic heart failure, hyperlipidemia, uncontrolled diabetes type 2, morbid obesity with a BMI greater than 40, and OSA.    Echo 5/12: Left ventricle: The cavity size was normal. Wall thickness was increased in a pattern of mild LVH. Systolic function was normal. The estimated ejection fraction was in the range of 60% to 65%.  Possible basal inferior hypokinesis. Features are consistent with a pseudonormal left ventricular filling pattern, with concomitant abnormal relaxation and increased filling pressure (grade 2 diastolic dysfunction).  She was admitted to [name] week for fluid overload.  She diuresed ~14 kg with improvement in her symptoms of SOB/orthopnea/lower extremity edema.  She was discharged on Lasix 120 mg TID and metolazone.  She was unable to obtain the metolazone from [name], as well as bisoprolol.    She is here for follow up.  Breathing better. Denies SOB/PND/Orthopnea. She continues on  Demadex 60 mg bid. She has been weighing at home [phone]).  She is not exercising.  [facility]. She is now able to obtain most medications at [name] except Fosinopril.  She is trying to limit  fluid intake. Tries to follow low salt diet.  Eating lots of salad. Blood sugar at home 160.  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   ROS: All other systems normal except as mentioned in HPI, past medical history and problem list.    Past Medical History  Diagnosis Date  . Diabetes mellitus   . Hypertension   . CHF (congestive heart failure)   . Psychosis   . Obesity   . ANEMIA NOS   . ANXIETY DISORDER, GENERALIZED   . DEGENERATIVE JOINT DISEASE, KNEE   . DISORDER, BIPOLAR NOS   . HYPOTHYROIDISM NOS   . OBESITY NOS   .  THYROTOXICOSIS   . OSA (obstructive sleep apnea) 03/06/2011    Current Outpatient Prescriptions  Medication Sig Dispense Refill  . ARIPiprazole (ABILIFY) 10 MG tablet Take 10 mg by mouth daily.        . ferrous sulfate 325 (65 FE) MG tablet Take 325 mg by mouth daily with breakfast.        . fosinopril (MONOPRIL) 40 MG tablet Take 1 tablet (40 mg total) by mouth daily.  30 tablet  6  . insulin aspart (NOVOLOG) 100 UNIT/ML injection Inject 10 Units into the skin 3 (three) times daily before meals. 4 units tid  1 vial  6  . insulin glargine (LANTUS) 100 UNIT/ML injection Inject 30 Units into the skin at bedtime.       . metFORMIN (GLUMETZA) 1000 MG (MOD) 24 hr tablet Take 1,000 mg by mouth 2 (two) times daily with a meal.       . potassium chloride (KLOR-CON) 10 MEQ CR tablet Take 4 tablets (40 mEq total) by mouth 2 (two) times daily.      . rosuvastatin (CRESTOR) 10 MG tablet Take 10 mg by mouth daily.        Marland Kitchen torsemide (DEMADEX) 20 MG tablet Take 3 tablets  Twice daily           Allergies  Allergen Reactions  . Acetaminophen     Fits as a child "seizures-like"  .  Caffeine     Tense, anxiety, increased urination  . Lisinopril     Rash with lisinopril; but fosinopril is ok per patient    History   Social History  . Marital Status: Single    Spouse Name: N/A    Number of Children: N/A  . Years of Education: N/A   Occupational History  . unemployed    Social History Main Topics  . Smoking status: Never Smoker   . Smokeless tobacco: Not on file  . Alcohol Use: No  . Drug Use: Not on file  . Sexually Active: Not on file   Other Topics Concern  . Not on file   Social History Narrative  . No narrative on file    Family History  Problem Relation Age of Onset  . Heart disease Father   . Heart disease Maternal Grandfather   . Heart disease Paternal Grandfather     PHYSICAL EXAM: Filed Vitals:   10/05/11 1643  BP: 142/94  Pulse: 98  Weight 358 (351)   General:   Well appearing.  No respiratory difficulty. Mom present HEENT: normal Neck: supple. JVP  5-6Difficult to assess Carotids 2+ bilat; no bruits. No lymphadenopathy or thryomegaly appreciated. Cor: PMI nondisplaced. Regular rate & rhythm. No rubs, gallops or murmurs. Lungs: Decreased in the bases.  Abdomen: obese, soft, nontender, nondistended. No hepatosplenomegaly. No bruits or masses. Good bowel sounds. Extremities: no cyanosis, clubbing, rash, edema Neuro: alert & oriented x 3, cranial nerves grossly intact. moves all 4 extremities w/o difficulty. Affect pleasant.    ASSESSMENT & PLAN:

## 2011-10-05 NOTE — Assessment & Plan Note (Addendum)
NYHA II-III. Volume status stable. Continue Demadex 60 mg bid.  She is encouraged to exercise and lose weight. Encouraged to try weight watchers. Follow up in 3 months.   Patient seen and examined with Tonye Becket, NP. We discussed all aspects of the encounter. I agree with the assessment and plan as stated above. Volume status well controlled on current regimen however weight continues to increase. We continue to enforce the dire need for weight loss but she appears to have a food addiction and cannot make any progress. Suggested Weight Watchers.

## 2011-10-05 NOTE — Patient Instructions (Signed)
Do the following things EVERYDAY: 1) Weigh yourself in the morning before breakfast. Write it down and keep it in a log. 2) Take your medicines as prescribed 3) Eat low salt foods-Limit salt (sodium) to 2000mg  per day.  4) Stay as active as you can everyday  Follow up in 3 months

## 2011-10-06 NOTE — Assessment & Plan Note (Signed)
As above. Suspect she has a food addiction. Have suggested Weight Watchers though I worry our efforts to this regards are becoming hopeless.

## 2011-12-09 ENCOUNTER — Encounter (HOSPITAL_COMMUNITY): Payer: Self-pay

## 2012-01-04 ENCOUNTER — Ambulatory Visit (HOSPITAL_COMMUNITY)
Admission: RE | Admit: 2012-01-04 | Discharge: 2012-01-04 | Disposition: A | Discharge status: Home / Self Care | Payer: Self-pay | Source: Ambulatory Visit | Attending: Internal Medicine | Admitting: Internal Medicine

## 2012-01-04 ENCOUNTER — Encounter (HOSPITAL_COMMUNITY): Payer: Self-pay

## 2012-01-04 VITALS — BP 132/76 | HR 98 | Wt 371.2 lb

## 2012-01-04 DIAGNOSIS — I509 Heart failure, unspecified: Secondary | ICD-10-CM

## 2012-01-04 DIAGNOSIS — I5032 Chronic diastolic (congestive) heart failure: Secondary | ICD-10-CM | POA: Insufficient documentation

## 2012-01-04 DIAGNOSIS — E669 Obesity, unspecified: Secondary | ICD-10-CM | POA: Insufficient documentation

## 2012-01-04 NOTE — Assessment & Plan Note (Addendum)
Volume status appears stable at this time.  Main issue continues to be obesity/food addiction.  Have discussed the need for weight loss and the fact that if she continues in this direction her prognosis is poor.  She voices understanding.  Have given her information for the [facility], she says that they will call to look into the program.    Patient seen and examined with Ulyess Blossom PA-C. We discussed all aspects of the encounter. I agree with the assessment and plan as stated above.  Volume status looks fine. Extensive amount of time spent discussing need for weight loss and we gave her information regarding inpatient weight loss programs. She has asked to f/u with [place] every 2 weeks to help her with weight loss but we told her that we are not weight loss specialists and suggested she follow with a weight loss clinic instead.

## 2012-01-04 NOTE — Progress Notes (Signed)
HPI:  [name] is a 46 year old female with chronic diastolic heart failure, hyperlipidemia, uncontrolled diabetes type 2, morbid obesity with a BMI greater than 40, and OSA.    Echo 5/12: Left ventricle: The cavity size was normal. Wall thickness was increased in a pattern of mild LVH. Systolic function was normal. The estimated ejection fraction was in the range of 60% to 65%.  Possible basal inferior hypokinesis. Features are consistent with a pseudonormal left ventricular filling pattern, with concomitant abnormal relaxation and increased filling pressure (grade 2 diastolic dysfunction).  She was admitted to [facility] 06/2011 and diuresed ~14 kg with improvement in her symptoms of SOB/orthopnea/lower extremity edema.  She was discharged on Lasix 120 mg TID and metolazone.  She was unable to obtain the metolazone from [facility], as well as bisoprolol.    Here for follow up today.  Feels ok today.  Her weight has steadily been increasing the last couple of months, now 366 pounds.  She has noted increased cough for 2-3 weeks.  Not walking because hard to breath, knee pain and does not feel good.  +orthopnea.  -PND.  Minimal swelling in legs.  Craving to eat all food 24 hours, can not think about anything else.  Eats a lot of bread and potatoes.  Will see PCP at end of May.  She is not following any type of diet.   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   ROS: All other systems normal except as mentioned in HPI, past medical history and problem list.    Past Medical History  Diagnosis Date  . Diabetes mellitus   . Hypertension   . CHF (congestive heart failure)   . Psychosis   . Obesity   . ANEMIA NOS   . ANXIETY DISORDER, GENERALIZED   . DEGENERATIVE JOINT DISEASE, KNEE   . DISORDER, BIPOLAR NOS   . HYPOTHYROIDISM NOS   . OBESITY NOS   . THYROTOXICOSIS   . OSA (obstructive sleep apnea) 03/06/2011    Current Outpatient Prescriptions  Medication Sig Dispense Refill  .  ARIPiprazole (ABILIFY) 10 MG tablet Take 10 mg by mouth daily.        . ferrous sulfate 325 (65 FE) MG tablet Take 325 mg by mouth daily with breakfast.        . fosinopril (MONOPRIL) 40 MG tablet Take 1 tablet (40 mg total) by mouth daily.  30 tablet  6  . insulin aspart (NOVOLOG) 100 UNIT/ML injection Inject 10 Units into the skin 2 (two) times daily before lunch and supper. 4 units tid      . insulin glargine (LANTUS) 100 UNIT/ML injection Inject 30 Units into the skin at bedtime.       . metFORMIN (GLUMETZA) 1000 MG (MOD) 24 hr tablet Take 1,000 mg by mouth daily with breakfast.       . potassium chloride (KLOR-CON) 10 MEQ CR tablet Take 40 mEq by mouth daily.      . rosuvastatin (CRESTOR) 10 MG tablet Take 10 mg by mouth daily.        Marland Kitchen torsemide (DEMADEX) 20 MG tablet Take 60 mg by mouth daily. Take 3 tablets  Twice daily       . DISCONTD: insulin aspart (NOVOLOG) 100 UNIT/ML injection Inject 10 Units into the skin 3 (three) times daily before meals. 4 units tid  1 vial  6  . DISCONTD: potassium chloride (KLOR-CON) 10 MEQ CR tablet Take 4 tablets (40  mEq total) by mouth 2 (two) times daily.         Allergies  Allergen Reactions  . Acetaminophen     Fits as a child "seizures-like"  . Caffeine     Tense, anxiety, increased urination  . Lisinopril     Rash with lisinopril; but fosinopril is ok per patient     PHYSICAL EXAM: Filed Vitals:   01/04/12 1329  BP: 132/76  Pulse: 98  Weight: 371 lb 4 oz (168.398 kg)  SpO2: 97%    General:  Morbidly obese, No respiratory difficulty. Mom present HEENT: normal Neck: supple. JVP difficult to assess but does not appear elevated. Carotids 2+ bilat; no bruits. No lymphadenopathy or thryomegaly appreciated. Cor: PMI nondisplaced. Regular rate & rhythm. No rubs, gallops or murmurs. Lungs: Decreased in the bases.  Abdomen: obese, soft, nontender, nondistended. No hepatosplenomegaly. No bruits or masses. Good bowel sounds. Extremities: no  cyanosis, clubbing, rash, trace edema Neuro: alert & oriented x 3, cranial nerves grossly intact. moves all 4 extremities w/o difficulty. Affect pleasant.    ASSESSMENT & PLAN:

## 2012-01-04 NOTE — Patient Instructions (Signed)
Call for Weight Loss program.  Continue current medications.  Follow up 6 months

## 2012-01-04 NOTE — Assessment & Plan Note (Signed)
As above, this is her main issue.  Will have her call call the [facility].

## 2012-01-14 ENCOUNTER — Telehealth (HOSPITAL_COMMUNITY): Payer: Self-pay | Admitting: Physician Assistant

## 2012-01-14 NOTE — Telephone Encounter (Signed)
Spoke with Registered Dietician, Elio Forget.  She is currently not accepting new patients but is willing to talk to the patient and see if she can get her plugged in with the appropriate group/physician.  I have discussed this with the patient and she will call her at [phone].

## 2012-02-06 ENCOUNTER — Other Ambulatory Visit (HOSPITAL_COMMUNITY): Payer: Self-pay | Admitting: Adult Health

## 2012-06-14 ENCOUNTER — Emergency Department (INDEPENDENT_AMBULATORY_CARE_PROVIDER_SITE_OTHER)
Admission: EM | Admit: 2012-06-14 | Discharge: 2012-06-14 | Disposition: A | Discharge status: Nursing Home (Includes ICF) | Payer: Self-pay | Source: Home / Self Care

## 2012-06-14 ENCOUNTER — Encounter (HOSPITAL_COMMUNITY): Payer: Self-pay

## 2012-06-14 ENCOUNTER — Observation Stay (HOSPITAL_COMMUNITY)
Admission: EM | Admit: 2012-06-14 | Discharge: 2012-06-15 | Disposition: A | Discharge status: Home / Self Care | Payer: Self-pay | Attending: Emergency Medicine | Admitting: Emergency Medicine

## 2012-06-14 ENCOUNTER — Encounter (HOSPITAL_COMMUNITY): Payer: Self-pay | Admitting: Emergency Medicine

## 2012-06-14 ENCOUNTER — Emergency Department (HOSPITAL_COMMUNITY): Payer: Self-pay

## 2012-06-14 ENCOUNTER — Emergency Department (INDEPENDENT_AMBULATORY_CARE_PROVIDER_SITE_OTHER): Payer: Self-pay

## 2012-06-14 DIAGNOSIS — G4733 Obstructive sleep apnea (adult) (pediatric): Secondary | ICD-10-CM | POA: Insufficient documentation

## 2012-06-14 DIAGNOSIS — F411 Generalized anxiety disorder: Secondary | ICD-10-CM | POA: Insufficient documentation

## 2012-06-14 DIAGNOSIS — R079 Chest pain, unspecified: Principal | ICD-10-CM | POA: Insufficient documentation

## 2012-06-14 DIAGNOSIS — I509 Heart failure, unspecified: Secondary | ICD-10-CM | POA: Insufficient documentation

## 2012-06-14 DIAGNOSIS — I10 Essential (primary) hypertension: Secondary | ICD-10-CM | POA: Insufficient documentation

## 2012-06-14 DIAGNOSIS — Z9189 Other specified personal risk factors, not elsewhere classified: Secondary | ICD-10-CM

## 2012-06-14 DIAGNOSIS — E669 Obesity, unspecified: Secondary | ICD-10-CM | POA: Insufficient documentation

## 2012-06-14 DIAGNOSIS — E119 Type 2 diabetes mellitus without complications: Secondary | ICD-10-CM | POA: Insufficient documentation

## 2012-06-14 DIAGNOSIS — R0602 Shortness of breath: Secondary | ICD-10-CM | POA: Insufficient documentation

## 2012-06-14 DIAGNOSIS — R0789 Other chest pain: Secondary | ICD-10-CM | POA: Diagnosis present

## 2012-06-14 DIAGNOSIS — Z789 Other specified health status: Secondary | ICD-10-CM

## 2012-06-14 HISTORY — DX: Chest pain, unspecified: R07.9

## 2012-06-14 HISTORY — DX: Reserved for concepts with insufficient information to code with codable children: IMO0002

## 2012-06-14 HISTORY — DX: Body Mass Index (BMI) 40.0 and over, adult: Z684

## 2012-06-14 HISTORY — DX: Type 2 diabetes mellitus with hyperglycemia: E11.65

## 2012-06-14 HISTORY — DX: Chronic diastolic (congestive) heart failure: I50.32

## 2012-06-14 HISTORY — DX: Morbid (severe) obesity due to excess calories: E66.01

## 2012-06-14 HISTORY — DX: Long term (current) use of insulin: Z79.4

## 2012-06-14 LAB — BASIC METABOLIC PANEL
BUN: 10 mg/dL (ref 6–23)
Calcium: 9.7 mg/dL (ref 8.4–10.5)
Creatinine, Ser: 0.45 mg/dL — ABNORMAL LOW (ref 0.50–1.10)
GFR calc Af Amer: >90 mL/min (ref >90)
GFR calc non Af Amer: >90 mL/min (ref >90)
Glucose, Bld: 192 mg/dL — ABNORMAL HIGH (ref 70–99)

## 2012-06-14 LAB — POCT I-STAT, CHEM 8
Calcium, Ion: 1.18 mmol/L (ref 1.12–1.23)
Chloride: 98 mEq/L (ref 96–112)
Creatinine, Ser: 0.7 mg/dL (ref 0.50–1.10)
Glucose, Bld: 216 mg/dL — ABNORMAL HIGH (ref 70–99)
Potassium: 3.5 mEq/L (ref 3.5–5.1)

## 2012-06-14 LAB — POCT I-STAT TROPONIN I: Troponin i, poc: 0 ng/mL (ref 0.00–0.08)

## 2012-06-14 LAB — CBC
HCT: 40.6 % (ref 36.0–46.0)
Hemoglobin: 13.6 g/dL (ref 12.0–15.0)
MCH: 29 pg (ref 26.0–34.0)
MCHC: 33.5 g/dL (ref 30.0–36.0)
MCV: 86.6 fL (ref 78.0–100.0)
RDW: 13.3 % (ref 11.5–15.5)

## 2012-06-14 LAB — GLUCOSE, CAPILLARY

## 2012-06-14 MED ORDER — MORPHINE SULFATE 2 MG/ML IJ SOLN: 2.0000 mg | INTRAMUSCULAR | Status: DC | PRN

## 2012-06-14 MED ORDER — ASPIRIN 81 MG PO CHEW
CHEWABLE_TABLET | ORAL | Status: AC
  Filled 2012-06-14: qty 4

## 2012-06-14 MED ORDER — SODIUM CHLORIDE 0.9 % IV SOLN: 20.0000 mL | INTRAVENOUS | Status: DC

## 2012-06-14 MED ORDER — INSULIN GLARGINE 100 UNIT/ML ~~LOC~~ SOLN
30.0000 [IU] | Freq: Once | SUBCUTANEOUS | Status: AC
  Administered 2012-06-14: 30 [IU] via SUBCUTANEOUS
  Filled 2012-06-14: qty 3

## 2012-06-14 MED ORDER — ASPIRIN 81 MG PO CHEW
324.0000 mg | CHEWABLE_TABLET | Freq: Once | ORAL | Status: AC
  Administered 2012-06-14: 324 mg via ORAL

## 2012-06-14 NOTE — ED Notes (Signed)
Patient thirsty.  Verified with david, np, po's are allowed.Marland Kitchen

## 2012-06-14 NOTE — ED Provider Notes (Signed)
History     CSN: [id number]  Arrival date & time 06/14/12  1530   First MD Initiated Contact with Patient 06/14/12 2052      Chief Complaint  Patient presents with  . Chest Pain    (Consider location/radiation/quality/duration/timing/severity/associated sxs/prior treatment) Patient is a 46 y.o. female presenting with chest pain. The history is provided by the patient.  Chest Pain Episode onset: 10 days ago. Chest pain occurs intermittently. The chest pain is worsening. Associated with: initially w/ exertion, now at rest. At its most intense, the pain is at 5/10. The pain is currently at 0/10. The severity of the pain is moderate. The quality of the pain is described as sharp. The pain does not radiate. Exacerbated by: sometimes exertion. Primary symptoms include shortness of breath (mild) and cough. Pertinent negatives for primary symptoms include no fever, no fatigue, no abdominal pain, no nausea, no vomiting and no dizziness. She tried nothing for the symptoms.     Past Medical History  Diagnosis Date  . Diabetes mellitus   . Hypertension   . CHF (congestive heart failure)   . Psychosis   . Obesity   . ANEMIA NOS   . ANXIETY DISORDER, GENERALIZED   . DEGENERATIVE JOINT DISEASE, KNEE   . DISORDER, BIPOLAR NOS   . HYPOTHYROIDISM NOS   . OBESITY NOS   . THYROTOXICOSIS   . OSA (obstructive sleep apnea) 03/06/2011    History reviewed. No pertinent past surgical history.  Family History  Problem Relation Age of Onset  . Heart disease Father   . Heart disease Maternal Grandfather   . Heart disease Paternal Grandfather     History  Substance Use Topics  . Smoking status: Never Smoker   . Smokeless tobacco: Not on file  . Alcohol Use: No    OB History    Grav Para Term Preterm Abortions TAB SAB Ect Mult Living                  Review of Systems  Constitutional: Negative for fever and fatigue.  HENT: Negative for congestion, drooling and neck pain.   Eyes:  Negative for pain.  Respiratory: Positive for cough and shortness of breath (mild).   Cardiovascular: Positive for chest pain.  Gastrointestinal: Negative for nausea, vomiting, abdominal pain and diarrhea.  Genitourinary: Negative for dysuria and hematuria.  Musculoskeletal: Negative for back pain and gait problem.  Skin: Negative for color change.  Neurological: Negative for dizziness and headaches.  Hematological: Negative for adenopathy.  Psychiatric/Behavioral: Negative for behavioral problems.  All other systems reviewed and are negative.    Allergies  Acetaminophen; Caffeine; and Lisinopril  Home Medications   Current Outpatient Rx  Name Route Sig Dispense Refill  . ARIPIPRAZOLE 10 MG PO TABS Oral Take 10 mg by mouth daily.      Marland Kitchen FERROUS SULFATE 325 (65 FE) MG PO TABS Oral Take 325 mg by mouth daily with breakfast.      . FOSINOPRIL SODIUM 40 MG PO TABS Oral Take 40 mg by mouth daily.    . INSULIN ASPART 100 UNIT/ML [place] SOLN Subcutaneous Inject 4 Units into the skin 3 (three) times daily before meals.     . INSULIN GLARGINE 100 UNIT/ML  SOLN Subcutaneous Inject 30 Units into the skin at bedtime.     Marland Kitchen METFORMIN HCL ER (MOD) 1000 MG PO TB24 Oral Take 1,000 mg by mouth daily with breakfast.     . POTASSIUM CHLORIDE 10 MEQ PO TBCR  Oral Take 40 mEq by mouth daily.    Marland Kitchen ROSUVASTATIN CALCIUM 10 MG PO TABS Oral Take 10 mg by mouth daily.      . TORSEMIDE 20 MG PO TABS Oral Take 60 mg by mouth 2 (two) times daily.       BP 148/98  Pulse 85  Temp 98.4 F (36.9 C) (Oral)  Resp 18  Ht 5\' 7"  (1.702 m)  Wt 364 lb (165.109 kg)  BMI 57.01 kg/m2  SpO2 98%  LMP 12/28/2011  Physical Exam  Nursing note and vitals reviewed. Constitutional: She is oriented to person, place, and time. She appears well-developed and well-nourished.  HENT:  Head: Normocephalic.  Mouth/Throat: No oropharyngeal exudate.  Eyes: Conjunctivae normal and EOM are normal. Pupils are equal, round, and  reactive to light.  Neck: Normal range of motion. Neck supple.  Cardiovascular: Normal rate, regular rhythm, normal heart sounds and intact distal pulses.  Exam reveals no gallop and no friction rub.   No murmur heard. Pulmonary/Chest: Effort normal and breath sounds normal. No respiratory distress. She has no wheezes.  Abdominal: Soft. Bowel sounds are normal. There is no tenderness. There is no rebound and no guarding.  Musculoskeletal: Normal range of motion. She exhibits no edema and no tenderness.  Neurological: She is alert and oriented to person, place, and time.  Skin: Skin is warm and dry.  Psychiatric: She has a normal mood and affect. Her behavior is normal.    ED Course  Procedures (including critical care time)  Labs Reviewed  BASIC METABOLIC PANEL - Abnormal; Notable for the following:    Sodium 134 (*)     Chloride 95 (*)     Glucose, Bld 192 (*)     Creatinine, Ser 0.45 (*)  DELTA CHECK NOTED   All other components within normal limits  PRO B NATRIURETIC PEPTIDE - Abnormal; Notable for the following:    Pro B Natriuretic peptide (BNP) 172.5 (*)     All other components within normal limits  GLUCOSE, CAPILLARY - Abnormal; Notable for the following:    Glucose-Capillary 192 (*)     All other components within normal limits  CBC  POCT I-STAT TROPONIN I   Dg Chest 2 View  06/14/2012  *RADIOLOGY REPORT*  Clinical Data: 46 year old female with chest pain radiating to the left shoulder.  Shortness of breath.  CHEST - 2 VIEW  Comparison: 1417 hours the same day and earlier.  Findings: Stable continued mildly increased lung volumes. Stable cardiomegaly and mediastinal contours.  Chronic increased interstitial markings.  No consolidation.  No pleural effusion or pulmonary edema.  Mild streaky lower lobe opacity again most resembles atelectasis and is not increased. No acute osseous abnormality identified.  IMPRESSION: Chronic cardiomegaly and interstitial pulmonary changes with  mild superimposed atelectasis.   Original Report Authenticated By: Harley Hallmark, M.D.    Dg Chest 2 View  06/14/2012  *RADIOLOGY REPORT*  Clinical Data: 46 year old female chest pain.  CHEST - 2 VIEW  Comparison: Chest CTA 07/17/2011.  Chest radiograph 02/02/2011.  Findings: Slightly improved lung volumes.  Stable mild cardiomegaly. Other mediastinal contours are within normal limits. Visualized tracheal air column is within normal limits.  No pneumothorax, pulmonary edema or pleural effusion.  No consolidation or confluent pulmonary opacity.  Mild linear markings at both bases probably is atelectasis. No acute osseous abnormality identified.  IMPRESSION: Improved lung volumes.  Mild residual atelectasis.   Original Report Authenticated By: Harley Hallmark, M.D.  1. Atypical chest pain      Date: 06/15/2012  Rate: 84  Rhythm: normal sinus rhythm  QRS Axis: right  Intervals: QT mildly prolonged  ST/T Wave abnormalities: early repolarization  Conduction Disutrbances:none  Narrative Interpretation: No new ST or T wave changes cw ischemia  Old EKG Reviewed: unchanged    MDM  12:12 AM 46 y.o. female w hx of DM, HTN, CHF, obesity who pw chest pain x 10 days, and mild worsening of sob. Pt also notes BS difficult to control for last 2-3 days. Pt AFVSS here, denies sx on exam now. Labs, ecg thus far non-contrib. Pain is atypical in nature. Negative adenosine nuclear study in 2010.   Discussed case w/ Dr. Mayford Knife (cards) who will notify cards team of pt's presence in CDU. Plan for stress echo in the morning.   Clinical Impression 1. Atypical chest pain          Purvis Sheffield, MD 06/15/12 [phone]

## 2012-06-14 NOTE — ED Notes (Signed)
Pt reports her main concern is she was out of her lantus medications, pt denies any pain or complaints at this time, denies any n/v/d or weakness. Pt MAE x4, skin w/d and resp e/u.

## 2012-06-14 NOTE — ED Provider Notes (Signed)
History     CSN: [id number]  Arrival date & time 06/14/12  1234   None     Chief Complaint  Patient presents with  . Diabetes    (Consider location/radiation/quality/duration/timing/severity/associated sxs/prior treatment) HPI Comments: [facility] pt. Morbily obese female with hx of T2DM, HTN,  Presents with a list of complaints but primarily that her blood sugars are not under control , all >240. She takes Lantus 30u daily and Humalog ac,  and will be out of Lantus tomorrow.   Chest pain for about 1 week, intermittent and feels sharp and fleeting, especially with a deep breath and movement. Chronically has some level of persistent dyspnea. Occasional sweating if in bed,   Also with mild diarrhea and stomach discomfort of recent onset, length of time vague.    Patient is a 46 y.o. female presenting with diabetes problem. The history is provided by the patient. The history is limited by a language barrier.  Diabetes Pertinent negatives for hypoglycemia include no dizziness, seizures or speech difficulty. Associated symptoms include chest pain.    Past Medical History  Diagnosis Date  . Diabetes mellitus   . Hypertension   . CHF (congestive heart failure)   . Psychosis   . Obesity   . ANEMIA NOS   . ANXIETY DISORDER, GENERALIZED   . DEGENERATIVE JOINT DISEASE, KNEE   . DISORDER, BIPOLAR NOS   . HYPOTHYROIDISM NOS   . OBESITY NOS   . THYROTOXICOSIS   . OSA (obstructive sleep apnea) 03/06/2011    History reviewed. No pertinent past surgical history.  Family History  Problem Relation Age of Onset  . Heart disease Father   . Heart disease Maternal Grandfather   . Heart disease Paternal Grandfather     History  Substance Use Topics  . Smoking status: Never Smoker   . Smokeless tobacco: Not on file  . Alcohol Use: No    OB History    Grav Para Term Preterm Abortions TAB SAB Ect Mult Living                  Review of Systems  Constitutional: Positive for  diaphoresis. Negative for fever, activity change and appetite change.  HENT: Negative.   Eyes: Negative.  Negative for discharge.  Respiratory: Positive for shortness of breath. Negative for cough, chest tightness and wheezing.   Cardiovascular: Positive for chest pain and leg swelling.  Gastrointestinal: Positive for diarrhea.  Genitourinary: Positive for frequency. Negative for dysuria and flank pain.  Musculoskeletal: Positive for gait problem. Negative for arthralgias.  Neurological: Negative for dizziness, seizures, syncope and speech difficulty.    Allergies  Acetaminophen; Caffeine; and Lisinopril  Home Medications   Current Outpatient Rx  Name Route Sig Dispense Refill  . ARIPIPRAZOLE 10 MG PO TABS Oral Take 10 mg by mouth daily.      . FOSINOPRIL SODIUM 40 MG PO TABS  TAKE 1 TABLET BY MOUTH EVERY DAY 30 tablet 6  . INSULIN ASPART 100 UNIT/ML [place] SOLN Subcutaneous Inject 10 Units into the skin 2 (two) times daily before lunch and supper. 4 units tid    . INSULIN GLARGINE 100 UNIT/ML [place] SOLN Subcutaneous Inject 30 Units into the skin at bedtime.     Marland Kitchen METFORMIN HCL ER (MOD) 1000 MG PO TB24 Oral Take 1,000 mg by mouth daily with breakfast.     . POTASSIUM CHLORIDE 10 MEQ PO TBCR Oral Take 40 mEq by mouth daily.    Marland Kitchen ROSUVASTATIN CALCIUM 10  MG PO TABS Oral Take 10 mg by mouth daily.      . TORSEMIDE 20 MG PO TABS Oral Take 60 mg by mouth daily. Take 3 tablets  Twice daily     . FERROUS SULFATE 325 (65 FE) MG PO TABS Oral Take 325 mg by mouth daily with breakfast.        BP 147/93  Pulse 83  Temp 98.6 F (37 C) (Oral)  Resp 19  SpO2 94%  LMP 12/28/2011  Physical Exam  Constitutional: She is oriented to person, place, and time. She appears well-developed. No distress.  Eyes: EOM are normal. Pupils are equal, round, and reactive to light. Right eye exhibits no discharge. Left eye exhibits no discharge.  Neck: Normal range of motion. Neck supple.  Cardiovascular: Normal rate  and regular rhythm.   Murmur heard. Pulmonary/Chest: Effort normal. No respiratory distress. She has no wheezes. She exhibits tenderness.       Severe, marked anterior chest wall pain on the L anterior chest that she says mimics the pain in which she complains.  No heaviness, tightness, pressure type pain.   Musculoskeletal: Normal range of motion. She exhibits tenderness.  Lymphadenopathy:    She has no cervical adenopathy.  Neurological: She is alert and oriented to person, place, and time.  Skin: Skin is warm and dry. She is not diaphoretic.  Psychiatric: She has a normal mood and affect.    ED Course  Procedures (including critical care time)  Labs Reviewed  GLUCOSE, CAPILLARY - Abnormal; Notable for the following:    Glucose-Capillary 181 (*)     All other components within normal limits  POCT I-STAT, CHEM 8 - Abnormal; Notable for the following:    Glucose, Bld 216 (*)     All other components within normal limits   Dg Chest 2 View  06/14/2012  *RADIOLOGY REPORT*  Clinical Data: 46 year old female chest pain.  CHEST - 2 VIEW  Comparison: Chest CTA 07/17/2011.  Chest radiograph 02/02/2011.  Findings: Slightly improved lung volumes.  Stable mild cardiomegaly. Other mediastinal contours are within normal limits. Visualized tracheal air column is within normal limits.  No pneumothorax, pulmonary edema or pleural effusion.  No consolidation or confluent pulmonary opacity.  Mild linear markings at both bases probably is atelectasis. No acute osseous abnormality identified.  IMPRESSION: Improved lung volumes.  Mild residual atelectasis.   Original Report Authenticated By: Ulla Potash III, M.D.      1. Diabetes mellitus high risk   2. Chest pain       MDM   Results for orders placed during the hospital encounter of 06/14/12  GLUCOSE, CAPILLARY      Component Value Range   Glucose-Capillary 181 (*) 70 - 99 mg/dL  POCT I-STAT, CHEM 8      Component Value Range   Sodium 139  135  - 145 mEq/L   Potassium 3.5  3.5 - 5.1 mEq/L   Chloride 98  96 - 112 mEq/L   BUN 9  6 - 23 mg/dL   Creatinine, Ser 1.61  0.50 - 1.10 mg/dL   Glucose, Bld 096 (*) 70 - 99 mg/dL   Calcium, Ion 0.45  4.09 - 1.23 mmol/L   TCO2 27  0 - 100 mmol/L   Hemoglobin 15.0  12.0 - 15.0 g/dL   HCT 81.1  91.4 - 78.2 %   Dg Chest 2 View  06/14/2012  *RADIOLOGY REPORT*  Clinical Data: 46 year old female chest pain.  CHEST - 2  VIEW  Comparison: Chest CTA 07/17/2011.  Chest radiograph 02/02/2011.  Findings: Slightly improved lung volumes.  Stable mild cardiomegaly. Other mediastinal contours are within normal limits. Visualized tracheal air column is within normal limits.  No pneumothorax, pulmonary edema or pleural effusion.  No consolidation or confluent pulmonary opacity.  Mild linear markings at both bases probably is atelectasis. No acute osseous abnormality identified.  IMPRESSION: Improved lung volumes.  Mild residual atelectasis.   Original Report Authenticated By: Harley Hallmark, M.D.    EKG. NSR. Biphasic p waves. Biatrial enlargement and prolonged Q-T.          Hayden Rasmussen, NP 06/14/12 1500

## 2012-06-14 NOTE — ED Notes (Signed)
Meal tray given with drink to pt and family at bedside. Pt denies any pain or complaints at this time and will continue to monitor pt.

## 2012-06-14 NOTE — ED Provider Notes (Signed)
Medical screening examination/treatment/procedure(s) were performed by non-physician practitioner and as supervising physician I was immediately available for consultation/collaboration.  Raynald Blend, MD 06/14/12 [phone]

## 2012-06-14 NOTE — ED Notes (Addendum)
Pt reports intermittent (L) side chest pain radiating to (L) shoulder, exhausted, dizziness, SOB, N/D, abd pain, dry mouth, coughing  "bubble white" sputum x7 days. Pt denies back pain. Pt reports she heeds 3-4 pillows behind her to sleep or sleeps sitting up on the sofa. Pt reports at first her chest pain would start w/any activity and subside when she set down, pt reports now her chest pain comes on even w/resting. Pt also reports heart palpitations, pt sts "it feels like pulsating pain, like my heart is in my throat." pt sent here from [facility]. Pt also reports hx of diabetes w/an elevated blood sugar of 480 yesterday

## 2012-06-14 NOTE — ED Notes (Signed)
CBG 181.  

## 2012-06-14 NOTE — ED Provider Notes (Signed)
I saw and evaluated the patient, reviewed the resident's note and I agree with the findings and plan.  Agree with EKG interpretation.   Pt with DM, no other significant cardiac risk factors, atypical chest pains, plan for CDU protocol.   Anuj Summons B. Bernette Mayers, MD 06/14/12 [phone]

## 2012-06-14 NOTE — ED Notes (Signed)
Pt is c/o elevated blood sugars... It was 244 before breakfast today and it was 480 yesterday evening after dinner.... Also needing refill on her meds due to the closure of [facility].... Sx include: fatigue, dry mouth, abd pain, nausea and chest pain, diarrhea, frequent urinary ... Denies: fevers, vomiting.

## 2012-06-15 ENCOUNTER — Encounter (HOSPITAL_COMMUNITY): Payer: Self-pay | Admitting: Nurse Practitioner

## 2012-06-15 DIAGNOSIS — R0789 Other chest pain: Secondary | ICD-10-CM | POA: Diagnosis present

## 2012-06-15 LAB — POCT I-STAT TROPONIN I: Troponin i, poc: 0 ng/mL (ref 0.00–0.08)

## 2012-06-15 LAB — GLUCOSE, CAPILLARY: Glucose-Capillary: 196 mg/dL — ABNORMAL HIGH (ref 70–99)

## 2012-06-15 MED ORDER — INSULIN GLARGINE 100 UNIT/ML ~~LOC~~ SOLN
5.0000 [IU] | Freq: Once | SUBCUTANEOUS | Status: AC
  Administered 2012-06-15: 5 [IU] via SUBCUTANEOUS
  Filled 2012-06-15: qty 1

## 2012-06-15 MED ORDER — INSULIN GLARGINE 100 UNIT/ML ~~LOC~~ SOLN: 30.0000 [IU] | Freq: Every day | SUBCUTANEOUS | Status: DC

## 2012-06-15 MED ORDER — INSULIN ASPART 100 UNIT/ML ~~LOC~~ SOLN: 4.0000 [IU] | Freq: Three times a day (TID) | SUBCUTANEOUS | Status: DC

## 2012-06-15 MED ORDER — INSULIN GLARGINE 100 UNIT/ML ~~LOC~~ SOLN: 10.0000 [IU] | Freq: Once | SUBCUTANEOUS | Status: DC

## 2012-06-15 MED ORDER — INSULIN ASPART 100 UNIT/ML ~~LOC~~ SOLN
4.0000 [IU] | Freq: Once | SUBCUTANEOUS | Status: AC
  Administered 2012-06-15: 4 [IU] via SUBCUTANEOUS
  Filled 2012-06-15: qty 1

## 2012-06-15 MED ORDER — METFORMIN HCL 500 MG PO TABS: 1000.0000 mg | ORAL_TABLET | Freq: Every day | ORAL | Status: DC

## 2012-06-15 NOTE — ED Provider Notes (Signed)
Assumed pt in CDU for chest pain protocol.  Pt with atypical CP with hx of DM.  Plan for cardiac stress echo this AM.  However, since pt weigh 364 lbs, pt will not be qualified to perform cardiac stress test. Pt has neg delta troponin, 12 lead ECG reviewed, and unremarkable. On exam, pt is in no acute distress, L upper chest with reproducible tenderness on palpation. Heart RRR, S1S2 without M/R/G, lung sound appear distant 2/2 body habitus, but no W/R/R, abd nontender, +BS, palpable distal pulses x 4.    Upon reviewing pt's prior note, it was documented that  ([name] is a 46 year old female with chronic diastolic heart failure, hyperlipidemia, uncontrolled diabetes type 2, morbid obesity with a BMI greater than 40, and OSA.  Echo 5/12: Left ventricle: The cavity size was normal. Wall thickness was increased in a pattern of mild LVH. Systolic function was normal. The estimated ejection fraction was in the range of 60% to 65%. Possible basal inferior hypokinesis. Features are consistent with a pseudonormal left ventricular filling pattern, with concomitant abnormal relaxation and increased filling pressure (grade 2 diastolic dysfunction).)  Pt is currently being monitored and care by Dr. Gala Romney.     Date: 06/15/2012  Rate: 84  Rhythm: normal sinus rhythm  QRS Axis: right  Intervals: QT mildly prolonged  ST/T Wave abnormalities: early repolarization  Conduction Disutrbances:none  Narrative Interpretation: No new ST or T wave changes cw ischemia  Old EKG Reviewed: unchanged  8:15 AM i have consulted with [facility], who agrees to see pt in CDU for further management and care.  Pt requesting food at this time, will hold until seen by her cardiologist.    12:32 PM Pt has been seen by cardiologist.  They recommend cardiac catherization to r/o cardiac causes.  However pt refused procedure.  Pt request for diabetic medication refills including insulin aspart, insulin glargine as  metformin as her diabetic medication has ran out 3 days ago and she is unable to afford her medications.    1:31 PM i have consulted with Case Management for medication refill.  I was made aware that they can only filled for 3 days worth of medicine.  I strongly recommended pt to f/u with a PCP for further management of her chronic disease.  Will give resources as pt can no longer f/u with [facility].  Results for orders placed during the hospital encounter of 06/14/12  CBC      Component Value Range   WBC 7.7  4.0 - 10.5 K/uL   RBC 4.69  3.87 - 5.11 MIL/uL   Hemoglobin 13.6  12.0 - 15.0 g/dL   HCT 40.9  81.1 - 91.4 %   MCV 86.6  78.0 - 100.0 fL   MCH 29.0  26.0 - 34.0 pg   MCHC 33.5  30.0 - 36.0 g/dL   RDW 78.2  95.6 - 21.3 %   Platelets 287  150 - 400 K/uL  BASIC METABOLIC PANEL      Component Value Range   Sodium 134 (*) 135 - 145 mEq/L   Potassium 3.6  3.5 - 5.1 mEq/L   Chloride 95 (*) 96 - 112 mEq/L   CO2 26  19 - 32 mEq/L   Glucose, Bld 192 (*) 70 - 99 mg/dL   BUN 10  6 - 23 mg/dL   Creatinine, Ser 0.86 (*) 0.50 - 1.10 mg/dL   Calcium 9.7  8.4 - 57.8 mg/dL   GFR calc non  Af Amer >90  >90 mL/min   GFR calc Af Amer >90  >90 mL/min  PRO B NATRIURETIC PEPTIDE      Component Value Range   Pro B Natriuretic peptide (BNP) 172.5 (*) 0 - 125 pg/mL  GLUCOSE, CAPILLARY      Component Value Range   Glucose-Capillary 192 (*) 70 - 99 mg/dL   Comment 1 Documented in Chart     Comment 2 Notify RN     Comment 3 Call MD NNP PA CNM    POCT I-STAT TROPONIN I      Component Value Range   Troponin i, poc 0.00  0.00 - 0.08 ng/mL   Comment 3           POCT I-STAT TROPONIN I      Component Value Range   Troponin i, poc 0.00  0.00 - 0.08 ng/mL   Comment 3           POCT I-STAT TROPONIN I      Component Value Range   Troponin i, poc 0.00  0.00 - 0.08 ng/mL   Comment 3           POCT I-STAT TROPONIN I      Component Value Range   Troponin i, poc 0.00  0.00 - 0.08 ng/mL   Comment 3            GLUCOSE, CAPILLARY      Component Value Range   Glucose-Capillary 196 (*) 70 - 99 mg/dL   Dg Chest 2 View  1/61/0960  *RADIOLOGY REPORT*  Clinical Data: 46 year old female with chest pain radiating to the left shoulder.  Shortness of breath.  CHEST - 2 VIEW  Comparison: 1417 hours the same day and earlier.  Findings: Stable continued mildly increased lung volumes. Stable cardiomegaly and mediastinal contours.  Chronic increased interstitial markings.  No consolidation.  No pleural effusion or pulmonary edema.  Mild streaky lower lobe opacity again most resembles atelectasis and is not increased. No acute osseous abnormality identified.  IMPRESSION: Chronic cardiomegaly and interstitial pulmonary changes with mild superimposed atelectasis.   Original Report Authenticated By: Harley Hallmark, M.D.    Dg Chest 2 View  06/14/2012  *RADIOLOGY REPORT*  Clinical Data: 46 year old female chest pain.  CHEST - 2 VIEW  Comparison: Chest CTA 07/17/2011.  Chest radiograph 02/02/2011.  Findings: Slightly improved lung volumes.  Stable mild cardiomegaly. Other mediastinal contours are within normal limits. Visualized tracheal air column is within normal limits.  No pneumothorax, pulmonary edema or pleural effusion.  No consolidation or confluent pulmonary opacity.  Mild linear markings at both bases probably is atelectasis. No acute osseous abnormality identified.  IMPRESSION: Improved lung volumes.  Mild residual atelectasis.   Original Report Authenticated By: Harley Hallmark, M.D.       Fayrene Helper, PA-C 06/15/12 1423

## 2012-06-15 NOTE — ED Notes (Signed)
Waiting for Case Management, Pt. Denies any chest pain or sob.

## 2012-06-15 NOTE — Progress Notes (Signed)
Utilization review completed.  

## 2012-06-15 NOTE — ED Notes (Signed)
Pt. oob to the bathroom, gait steady, pt. Continues to have chest pain with movement.  NPO maintained.  Pt. 's  Stress Echo cancelled . Pt. Will be consulted by Cardiology

## 2012-06-15 NOTE — H&P (Signed)
Patient ID: [name] MRN: [id number], DOB/AGE: [birth year]   Admit date: 06/14/2012   Primary Physician: None Primary Cardiologist: B. Crenshaw, MD/ also seen in CHF clinic by Dr. Arvilla Meres & Robbi Garter, PA-C.  Pt. Profile: [name] is a 46 yo morbidly obese female with chronic diastolic CHF, hyperlipidemia, uncontrolled DM type 2 and chest pain x 7-10 days.  Problem List  Past Medical History  Diagnosis Date  . Insulin dependent type 2 diabetes mellitus, uncontrolled     Uncontrolled, sugar typically 240.   Marland Kitchen Hypertension   . Chronic diastolic CHF (congestive heart failure)     a. 04/2009 Echo: EF 60-65%, nl wall motion, restrictive physiology & decreased diast compliance, Mild MR, mod dil  LA, PASP .;  b. 01/2011 Echo: Nl LV, Gr 2 DD, biatrial enlargement.  Marland Kitchen Psychosis   . Morbid obesity with BMI of 50.0-59.9, adult     Ht: 5\' 7"  Wt: 364 lbs. BMI: 57.0  . ANEMIA NOS   . ANXIETY DISORDER, GENERALIZED   . DEGENERATIVE JOINT DISEASE, KNEE   . DISORDER, BIPOLAR NOS   . HYPOTHYROIDISM NOS   . THYROTOXICOSIS   . OSA (obstructive sleep apnea) 03/06/2011  . Chest pain     a. 2012 Myoview: EF 63%, no isch/infarct    History reviewed. No pertinent past surgical history.   Allergies  Allergies  Allergen Reactions  . Acetaminophen     Fits as a child "seizures-like"  . Caffeine     Tense, anxiety, increased urination  . Lisinopril     Rash with lisinopril; but fosinopril is ok per patient   HPI  [name] is a 46 yo AA obese female with history of chronic diastolic heart failure and uncontrolled T2DM. Has had SOB with exertion for last year and notices more symptoms when she does not take her medications. She has a h/o chest pain with negative myoview in 2012.  Over the past 3 months, she has noted occasional sharp/fleeting midsternal chest pain.  Over the last 7-10 days, these Ss have become more frequent  Described as sharp "stabbing pain,"  intermittent, lasting 3-5 minutes and occuring about every 3 hours. Ss have increased in frequency and duration over last 3 days along with non-restful sleep x 3 days due to anxiety that the pain may return. Pain worst during exertion, was a 9/10 at worst, pain radiates to left shoulder and left back with associated nausea and diaphoresis.  Ss also worse with inspiration. Has not taken any medications to help relieve the pain. Reports PND/orthopnea. Pain reproducible with palpation of chest wall. Denies recent trauma or exercise.   Reports her main reason for coming to ED is needing insulin and hyperglycemia. Ran out of insulin x3 days and no money to buy more. Also came because of diarrhea that only lasted yesterday.   Home Medications  Prior to Admission medications   Medication Sig Start Date End Date Taking? Authorizing Provider  ARIPiprazole (ABILIFY) 10 MG tablet Take 10 mg by mouth daily.     Yes Historical Provider, MD  ferrous sulfate 325 (65 FE) MG tablet Take 325 mg by mouth daily with breakfast.     Yes Historical Provider, MD  fosinopril (MONOPRIL) 40 MG tablet Take 40 mg by mouth daily.   Yes Historical Provider, MD  insulin aspart (NOVOLOG) 100 UNIT/ML injection Inject 4 Units into the skin 3 (three) times daily before meals.  08/11/11  Yes Amy Georgie Chard, NP  insulin  glargine (LANTUS) 100 UNIT/ML injection Inject 30 Units into the skin at bedtime.    Yes Historical Provider, MD  metFORMIN (GLUMETZA) 1000 MG (MOD) 24 hr tablet Take 1,000 mg by mouth daily with breakfast.    Yes Historical Provider, MD  potassium chloride (KLOR-CON) 10 MEQ CR tablet Take 40 mEq by mouth daily. 07/23/11  Yes Dolores Patty, MD  rosuvastatin (CRESTOR) 10 MG tablet Take 10 mg by mouth daily.     Yes Historical Provider, MD  torsemide (DEMADEX) 20 MG tablet Take 60 mg by mouth 2 (two) times daily.  08/11/11  Yes Amy Georgie Chard, NP   Family History  Family History  Problem Relation Age of Onset  . Heart  failure Father   . Heart disease Maternal Grandfather   . Heart disease Paternal Grandfather   . Stroke Father    Social History  History   Social History  . Marital Status: Single    Spouse Name: N/A    Number of Children: 0  . Years of Education: 57   Occupational History  . unemployed    Social History Main Topics  . Smoking status: Never Smoker   . Smokeless tobacco: Never Used  . Alcohol Use: No  . Drug Use: No  . Sexually Active: Not Currently   Other Topics Concern  . 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 But ran out of money and is no longer attending school. (Note patient has bipolar disorder).Born in [place] but lived in [place] before coming back to [place]. Primary language is Arabic. Lives with mother and brother.    Review of Systems General:  No chills, fever, night sweats. +recent weight gain.  Cardiovascular:  +chest pain, +dyspnea on exertion, -edema, +orthopnea, +palpitations, +paroxysmal nocturnal dyspnea. Dermatological: No rash, lesions/masses Respiratory: No cough. +dyspnea Urologic: No hematuria, dysuria Abdominal:   +nausea, -vomiting, +diarrhea, -bright red blood per rectum, -melena, -hematemesis Neurologic:  No visual changes, wkns, changes in mental status. +tenderness BL LE. All other systems reviewed and are otherwise negative except as noted above.  Physical Exam  Blood pressure 168/96, pulse 89, temperature 98.6 F (37 C), temperature source Oral, resp. rate 21, height 5\' 7"  (1.702 m), weight 364 lb (165.109 kg), last menstrual period 12/28/2011, SpO2 98.00%.  General: Morbidly obese female appearing in no acute distress.  Psych: Anxious and very talkative Neuro: Alert and oriented X 3. Moves all extremities spontaneously. HEENT: Normal  Neck: Difficult to assess due to body habitus. Supple without bruits or JVD. Lungs:  Resp  regular and unlabored, CTA bilaterally. Heart: RRR no s3, s4, or murmurs. Abdomen: Obese, soft, non-tender, non-distended, BS + x 4.  Extremities: No clubbing, cyanosis or edema. DP/PT/Radials 1+ and equal bilaterally.  Labs  POC TropI: 0.00 x 4  Lab Results  Component Value Date   WBC 7.7 06/14/2012   HGB 13.6 06/14/2012   HCT 40.6 06/14/2012   MCV 86.6 06/14/2012   PLT 287 06/14/2012     Lab 06/14/12 1607  NA 134*  K 3.6  CL 95*  CO2 26  BUN 10  CREATININE 0.45*  CALCIUM 9.7  PROT --  BILITOT --  ALKPHOS --  ALT --  AST --  GLUCOSE 192*   Radiology/Studies  Dg Chest 2 View  06/14/2012  *RADIOLOGY REPORT*  Clinical Data: 47 year old female with  chest pain radiating to the left shoulder.  Shortness of breath.  CHEST - 2 VIEW  Comparison: 1417 hours the same day and earlier.  Findings: Stable continued mildly increased lung volumes. Stable cardiomegaly and mediastinal contours.  Chronic increased interstitial markings.  No consolidation.  No pleural effusion or pulmonary edema.  Mild streaky lower lobe opacity again most resembles atelectasis and is not increased. No acute osseous abnormality identified.  IMPRESSION: Chronic cardiomegaly and interstitial pulmonary changes with mild superimposed atelectasis.   Original Report Authenticated By: Harley Hallmark, M.D.    Dg Chest 2 View  06/14/2012  *RADIOLOGY REPORT*  Clinical Data: 46 year old female chest pain.  CHEST - 2 VIEW  Comparison: Chest CTA 07/17/2011.  Chest radiograph 02/02/2011.  Findings: Slightly improved lung volumes.  Stable mild cardiomegaly. Other mediastinal contours are within normal limits. Visualized tracheal air column is within normal limits.  No pneumothorax, pulmonary edema or pleural effusion.  No consolidation or confluent pulmonary opacity.  Mild linear markings at both bases probably is atelectasis. No acute osseous abnormality identified.  IMPRESSION: Improved lung volumes.  Mild residual atelectasis.    Original Report Authenticated By: Harley Hallmark, M.D.    Echo Per Outpt note: "Echo 5/12: Left ventricle: The cavity size was normal. Wall thickness was increased in a pattern of mild LVH. Systolic function was normal. The estimated ejection fraction was in the range of 60% to 65%. Possible basal inferior hypokinesis. Features are consistent with a pseudonormal left ventricular filling pattern, with concomitant abnormal relaxation and increased filling pressure (grade 2 diastolic dysfunction)."  05/07/2009 1. Left ventricle: The cavity size was normal. There was moderate concentric hypertrophy. Systolic function was normal. The estimated ejection fraction was in the range of 60% to 65%. Wall motion was normal; there were no regional wall motion abnormalities. Doppler parameters are consistent with restrictive physiology, indicative of decreased left ventricular diastolic compliance and/or increased left atrial pressure. Doppler parameters are consistent with high ventricular filling pressure. 2. Mitral valve: Mild regurgitation. 3. Left atrium: The atrium was moderately dilated. 4. Right atrium: The atrium was mildly dilated. 5. Pulmonary arteries: PA peak pressure: 47mm Hg (S).  ECG 06/14/2012 3:48PM - NSR, 84 bpm. Biatrial enlargement, QT prolonged (QT 408 ms/ QTc 482 ms)  ASSESSMENT AND PLAN  1. Midsternal chest pain - 3 mo h/o intermittent, fleeting, sharp chest pain that has been worse/more freq over the past 7-10 days.  Despite multiple episodes, Troponin negative x4 (dating back to yesterday afternoon). No acute ECG changes. Patient has significant risk factors for CAD including DM, obesity, family history, HLD.  Given her size, she is not a candidate for stress testing/CTA and has stated adamantly that she is not interested in having a diagnostic cath.  2. Chronic diastolic CHF - Volume appears to be stable.  She does not weigh herself daily but does report compliance with her torsemide  and fosinopril.  BP @ home usually runs in 130's to 140.  3. Hyperlipidemia - Currently taking rosuvastatin 10 mg daily.  Given her financial hardship and loss of access to [facility], this will need to be changed to a $4 option when she runs out.  4. Diabetes mellitus type 2, insulin dependent, uncontrolled - Pt ran out of lantus and metformin 3 days ago.  This was the main reason for her coming to the ED.  She previously got meds from [facility] but as above, no longer has this option.  Given cost of lantus and her inability to  afford it, she would benefit from internal medicine evaluation and prescription of less expensive insulin option.  5. Morbid obesity - Up 100 lbs over past year and a half.  Knows that she needs to lose weight but hasn't been willing to commit to a diet.  6. Bipolar disorder - On Abilify 10 mg daily.  She will need primary care f/u.  7.  HTN:  bp up in ER however has not received usual meds.  As above, bp runs 130's-140's @ home.  Signed, Nicolasa Ducking, NP 06/15/2012, 10:47 AM

## 2012-06-15 NOTE — Progress Notes (Signed)
   CARE MANAGEMENT ED NOTE 06/15/2012  Patient:  [id number]   Account Number:  [id number]  Date Initiated:  06/15/2012  Documentation initiated by:  Fransico Michael  Subjective/Objective Assessment:     Subjective/Objective Assessment Detail:     Action/Plan:   Action/Plan Detail:   Anticipated DC Date:       Status Recommendation to Physician:   Result of Recommendation:      DC Planning Services  CM consult  Medication Assistance    Choice offered to / List presented to:            Status of service:  Completed, signed off  ED Comments:   ED Comments Detail:  06/15/12- 1327-J.Jahliyah Trice,RN,BSN [phone]     Notified by PA that patient needs assistance with medications (insulin and metformin). Patient reports that she used to be a patient at [facility]. Spoke with Brett Canales from [facility] outpatient pharmacy who reported that [facility] patient records were sent to [facility] here in [place]. Called [facility] at [phone] and spoke with Bethann Berkshire who informed NCM that they are trying to keep the same copays for the [facility] paitents. Insulin is that exception. Reportedly, the manufacturers of insulin were shipping insulin directly to [facility] to be distributed to patients. That service stopped when [facility] closed. Fayrene Helper, PA notified.

## 2012-06-15 NOTE — ED Provider Notes (Signed)
I was available for the CDU portion of the patient's care.  Gerhard Munch, MD 06/15/12 1623

## 2012-06-15 NOTE — H&P (Signed)
Atypical chest pain with multiple cardiac risk factors. Her negative Myoview in the past is helpful. Still however she is intermediate risk for coronary disease. We discussed catheterization and is mortality risks we also discussed the fact that she has a greater than 20% ten-year cardiac event risk as a coronary equivalent with her diabetes. We discussed the role of radial angiography; she remains reluctant.  Risk factor modification is an essential with lipid therapy, ACE inhibitor therapy, sleep apnea therapy, control of her diabetes, and exercise. Please let us know if there is anything we can do to help

## 2012-07-22 ENCOUNTER — Emergency Department (HOSPITAL_COMMUNITY)
Admission: EM | Admit: 2012-07-22 | Discharge: 2012-07-22 | Disposition: A | Discharge status: Home / Self Care | Payer: Self-pay | Attending: Emergency Medicine | Admitting: Emergency Medicine

## 2012-07-22 ENCOUNTER — Emergency Department (HOSPITAL_COMMUNITY): Payer: Self-pay

## 2012-07-22 ENCOUNTER — Other Ambulatory Visit: Payer: Self-pay

## 2012-07-22 ENCOUNTER — Encounter (HOSPITAL_COMMUNITY): Payer: Self-pay | Admitting: Adult Health

## 2012-07-22 DIAGNOSIS — J209 Acute bronchitis, unspecified: Secondary | ICD-10-CM | POA: Insufficient documentation

## 2012-07-22 DIAGNOSIS — I509 Heart failure, unspecified: Secondary | ICD-10-CM | POA: Insufficient documentation

## 2012-07-22 DIAGNOSIS — E119 Type 2 diabetes mellitus without complications: Secondary | ICD-10-CM | POA: Insufficient documentation

## 2012-07-22 DIAGNOSIS — E039 Hypothyroidism, unspecified: Secondary | ICD-10-CM | POA: Insufficient documentation

## 2012-07-22 DIAGNOSIS — F411 Generalized anxiety disorder: Secondary | ICD-10-CM | POA: Insufficient documentation

## 2012-07-22 DIAGNOSIS — Z862 Personal history of diseases of the blood and blood-forming organs and certain disorders involving the immune mechanism: Secondary | ICD-10-CM | POA: Insufficient documentation

## 2012-07-22 DIAGNOSIS — G4733 Obstructive sleep apnea (adult) (pediatric): Secondary | ICD-10-CM | POA: Insufficient documentation

## 2012-07-22 DIAGNOSIS — Z794 Long term (current) use of insulin: Secondary | ICD-10-CM | POA: Insufficient documentation

## 2012-07-22 DIAGNOSIS — J9801 Acute bronchospasm: Secondary | ICD-10-CM | POA: Insufficient documentation

## 2012-07-22 DIAGNOSIS — F319 Bipolar disorder, unspecified: Secondary | ICD-10-CM | POA: Insufficient documentation

## 2012-07-22 DIAGNOSIS — Z8739 Personal history of other diseases of the musculoskeletal system and connective tissue: Secondary | ICD-10-CM | POA: Insufficient documentation

## 2012-07-22 DIAGNOSIS — I10 Essential (primary) hypertension: Secondary | ICD-10-CM | POA: Insufficient documentation

## 2012-07-22 DIAGNOSIS — Z8679 Personal history of other diseases of the circulatory system: Secondary | ICD-10-CM | POA: Insufficient documentation

## 2012-07-22 LAB — CBC
HCT: 40.1 % (ref 36.0–46.0)
Hemoglobin: 13.3 g/dL (ref 12.0–15.0)
MCV: 86.6 fL (ref 78.0–100.0)
RBC: 4.63 MIL/uL (ref 3.87–5.11)
WBC: 7.2 10*3/uL (ref 4.0–10.5)

## 2012-07-22 LAB — BASIC METABOLIC PANEL
BUN: 9 mg/dL (ref 6–23)
CO2: 28 mEq/L (ref 19–32)
Chloride: 96 mEq/L (ref 96–112)
Creatinine, Ser: 0.55 mg/dL (ref 0.50–1.10)
Glucose, Bld: 229 mg/dL — ABNORMAL HIGH (ref 70–99)

## 2012-07-22 LAB — PRO B NATRIURETIC PEPTIDE: Pro B Natriuretic peptide (BNP): 543.4 pg/mL — ABNORMAL HIGH (ref 0–125)

## 2012-07-22 MED ORDER — ALBUTEROL SULFATE (5 MG/ML) 0.5% IN NEBU
2.5000 mg | INHALATION_SOLUTION | RESPIRATORY_TRACT | Status: DC
  Administered 2012-07-22: 2.5 mg via RESPIRATORY_TRACT
  Filled 2012-07-22: qty 0.5

## 2012-07-22 MED ORDER — ALBUTEROL SULFATE HFA 108 (90 BASE) MCG/ACT IN AERS: 1.0000 | INHALATION_SPRAY | Freq: Four times a day (QID) | RESPIRATORY_TRACT | Status: DC | PRN

## 2012-07-22 MED ORDER — AZITHROMYCIN 250 MG PO TABS
250.0000 mg | ORAL_TABLET | Freq: Once | ORAL | Status: AC
  Administered 2012-07-22: 250 mg via ORAL
  Filled 2012-07-22: qty 1

## 2012-07-22 MED ORDER — AZITHROMYCIN 250 MG PO TABS: ORAL_TABLET | ORAL | Status: DC

## 2012-07-22 MED ORDER — ASPIRIN 325 MG PO TABS
325.0000 mg | ORAL_TABLET | ORAL | Status: AC
  Administered 2012-07-22: 325 mg via ORAL
  Filled 2012-07-22: qty 1

## 2012-07-22 MED ORDER — NITROGLYCERIN 0.4 MG SL SUBL
0.4000 mg | SUBLINGUAL_TABLET | SUBLINGUAL | Status: DC | PRN
  Administered 2012-07-22: 0.4 mg via SUBLINGUAL

## 2012-07-22 MED ORDER — IPRATROPIUM BROMIDE 0.02 % IN SOLN
0.5000 mg | RESPIRATORY_TRACT | Status: DC
  Administered 2012-07-22: 0.5 mg via RESPIRATORY_TRACT
  Filled 2012-07-22: qty 2.5

## 2012-07-22 NOTE — ED Notes (Signed)
Old and new EKG shown to PODA MD, copies placed in chart.

## 2012-07-22 NOTE — ED Notes (Signed)
Productive cough, Gen aches, fever, Fatigue and SOB, stated some CP radiating toward the left shoulder and left arm, scale of one to 10 stated CP at 8/10.

## 2012-07-22 NOTE — ED Notes (Signed)
Pt complaining that she cannot breathe, SpO2 99%.

## 2012-07-22 NOTE — ED Notes (Signed)
Pt reports left sided stabbing chest pain that radiates into left arm and is constant associated with SOB and nausea and cough. Cough is productive with green/yellow thick sputum.  Pain and SOb is worse with with lying down and is affecting her sleep. Pain began 3-4 days ago.

## 2012-07-22 NOTE — ED Provider Notes (Signed)
History     CSN: [id number]  Arrival date & time 07/22/12  1736   First MD Initiated Contact with Patient 07/22/12 1932      Chief Complaint  Patient presents with  . Chest Pain    (Consider location/radiation/quality/duration/timing/severity/associated sxs/prior treatment) HPI Comments: 46 year old morbidly obese female with a history of CHF presents the emergency department complaining of productive cough with green phlegm for the past 2 weeks. The cough has not gotten worse, it is "just not going away". Admits to associated shortness of breath on exertion and while laying down. Also complaining of left-sided chest pain radiating into her left arm. Describes the pain as stabbing rated 8/10. She has not tried any alleviating factors. Admits to associated nausea without vomiting. States she feels as if she has a fever, however has not taken her temperature.  Patient is a 46 y.o. female presenting with chest pain. The history is provided by the patient and a relative.  Chest Pain Primary symptoms include a fever, shortness of breath, cough and nausea. Pertinent negatives for primary symptoms include no vomiting and no dizziness.  Pertinent negatives for associated symptoms include no weakness.     Past Medical History  Diagnosis Date  . Insulin dependent type 2 diabetes mellitus, uncontrolled     Uncontrolled, sugar typically 240.   Marland Kitchen Hypertension   . Chronic diastolic CHF (congestive heart failure)     a. 04/2009 Echo: EF 60-65%, nl wall motion, restrictive physiology & decreased diast compliance, Mild MR, mod dil  LA, PASP .;  b. 01/2011 Echo: Nl LV, Gr 2 DD, biatrial enlargement.  Marland Kitchen Psychosis   . Morbid obesity with BMI of 50.0-59.9, adult     Ht: 5\' 7"  Wt: 364 lbs. BMI: 57.0  . ANEMIA NOS   . ANXIETY DISORDER, GENERALIZED   . DEGENERATIVE JOINT DISEASE, KNEE   . DISORDER, BIPOLAR NOS   . HYPOTHYROIDISM NOS   . THYROTOXICOSIS   . OSA (obstructive sleep apnea) 03/06/2011    . Chest pain     a. 2012 Myoview: EF 63%, no isch/infarct    History reviewed. No pertinent past surgical history.  Family History  Problem Relation Age of Onset  . Heart failure Father   . Heart disease Maternal Grandfather   . Heart disease Paternal Grandfather   . Stroke Father     History  Substance Use Topics  . Smoking status: Never Smoker   . Smokeless tobacco: Never Used  . Alcohol Use: No    OB History    Grav Para Term Preterm Abortions TAB SAB Ect Mult Living                  Review of Systems  Constitutional: Positive for fever.  HENT: Positive for congestion.   Eyes: Negative for visual disturbance.  Respiratory: Positive for cough, chest tightness and shortness of breath.   Cardiovascular: Positive for chest pain.  Gastrointestinal: Positive for nausea. Negative for vomiting.  Genitourinary: Negative for difficulty urinating.  Musculoskeletal: Negative for back pain.  Skin: Negative for rash.  Neurological: Negative for dizziness and weakness.  Psychiatric/Behavioral: Negative for confusion.    Allergies  Acetaminophen; Caffeine; and Lisinopril  Home Medications   Current Outpatient Rx  Name Route Sig Dispense Refill  . FOSINOPRIL SODIUM 40 MG PO TABS Oral Take 40 mg by mouth daily.    . INSULIN ASPART 100 UNIT/ML [place] SOLN Subcutaneous Inject 4 Units into the skin 2 (two) times daily with a meal.    .  INSULIN GLARGINE 100 UNIT/ML [place] SOLN Subcutaneous Inject 30 Units into the skin at bedtime. 10 mL 12  . METFORMIN HCL 500 MG PO TABS Oral Take 2 tablets (1,000 mg total) by mouth daily with breakfast. 60 tablet 0  . POTASSIUM CHLORIDE 10 MEQ PO TBCR Oral Take 40 mEq by mouth daily.    Marland Kitchen ROSUVASTATIN CALCIUM 10 MG PO TABS Oral Take 10 mg by mouth daily.      . TORSEMIDE 20 MG PO TABS Oral Take 60 mg by mouth daily.       BP 158/81  Pulse 82  Temp 98.3 F (36.8 C) (Oral)  Resp 22  SpO2 99%  Physical Exam  Constitutional: She is oriented to  person, place, and time. No distress.       Morbidly obese  HENT:  Head: Normocephalic and atraumatic.  Mouth/Throat: Oropharynx is clear and moist.  Eyes: Conjunctivae normal and EOM are normal.  Neck: Normal range of motion. Neck supple.  Cardiovascular: Normal rate, regular rhythm and intact distal pulses.  Exam reveals distant heart sounds.   Pulmonary/Chest: Tachypnea noted. She has wheezes (scattered). She has rhonchi (scattered). She exhibits tenderness.       Distant breath sounds due to patient's size  Abdominal: Soft. Bowel sounds are normal. There is no tenderness.  Musculoskeletal: Normal range of motion.  Neurological: She is alert and oriented to person, place, and time.  Skin: Skin is warm and dry.  Psychiatric: Her speech is normal and behavior is normal. Her mood appears anxious.    ED Course  Procedures (including critical care time)  Labs Reviewed  BASIC METABOLIC PANEL - Abnormal; Notable for the following:    Glucose, Bld 229 (*)     All other components within normal limits  PRO B NATRIURETIC PEPTIDE - Abnormal; Notable for the following:    Pro B Natriuretic peptide (BNP) 543.4 (*)     All other components within normal limits  CBC  POCT I-STAT TROPONIN I   Dg Chest 2 View  07/22/2012  *RADIOLOGY REPORT*  Clinical Data: Chest pain, cough, shortness of breath  CHEST - 2 VIEW  Comparison: 06/14/2012  Findings: Chronic increased interstitial markings/bronchitic changes without frank interstitial edema.  Left basilar atelectasis.  No focal consolidation.  No pleural effusion or pneumothorax.  The heart is top normal in size.  Stable prominence of the bilateral pulmonary hila, likely vascular when correlating with prior CT.  Mild degenerative changes of the visualized thoracolumbar spine.  IMPRESSION: No evidence of acute cardiopulmonary disease.  Chronic increased interstitial markings/bronchitic changes.   Original Report Authenticated By: Charline Bills, M.D.        1. Acute bronchitis   2. Acute bronchospasm       MDM  46 year old morbidly obese female with productive cough, shortness of breath and chest pain. BNP elevated. Patient was given DuoNeb after initial examination. 8:19 PM patient is in the middle of her DuoNeb treatment, and states she is much less short of breath, her cough is less and her chest pain is now rated 4/10.  8:39 PM Patient completed duo-neb and is feeling "much better". She is stable for discharge. Will discharge with albuterol and azithromycin for her bronchitis and bronchospasm. Resource list for follow up given since her [facility]. Case discussed with Dr. Adriana Simas who agrees with plan of care.        Trevor Mace, PA-C 07/22/12 2040

## 2012-07-25 NOTE — ED Provider Notes (Signed)
Medical screening examination/treatment/procedure(s) were conducted as a shared visit with non-physician practitioner(s) and myself.  I personally evaluated the patient during the encounter.  Complains of cough, wheezing, minimal shortness of breath. Feels better after albuterol Atrovent neb. Chest x-ray negative  Donnetta Hutching, MD 07/25/12 [phone]

## 2012-08-17 ENCOUNTER — Encounter (HOSPITAL_COMMUNITY): Payer: Self-pay | Admitting: Emergency Medicine

## 2012-08-17 ENCOUNTER — Emergency Department (HOSPITAL_COMMUNITY)
Admission: EM | Admit: 2012-08-17 | Discharge: 2012-08-17 | Disposition: A | Discharge status: Home / Self Care | Payer: Self-pay | Source: Home / Self Care

## 2012-08-17 DIAGNOSIS — R739 Hyperglycemia, unspecified: Secondary | ICD-10-CM

## 2012-08-17 DIAGNOSIS — I509 Heart failure, unspecified: Secondary | ICD-10-CM

## 2012-08-17 DIAGNOSIS — G4733 Obstructive sleep apnea (adult) (pediatric): Secondary | ICD-10-CM

## 2012-08-17 DIAGNOSIS — E119 Type 2 diabetes mellitus without complications: Secondary | ICD-10-CM

## 2012-08-17 DIAGNOSIS — F411 Generalized anxiety disorder: Secondary | ICD-10-CM

## 2012-08-17 DIAGNOSIS — E039 Hypothyroidism, unspecified: Secondary | ICD-10-CM

## 2012-08-17 LAB — HEMOGLOBIN A1C
Hgb A1c MFr Bld: 10.4 % — ABNORMAL HIGH (ref <5.7)
Mean Plasma Glucose: 252 mg/dL — ABNORMAL HIGH (ref <117)

## 2012-08-17 LAB — GLUCOSE, CAPILLARY: Glucose-Capillary: 166 mg/dL — ABNORMAL HIGH (ref 70–99)

## 2012-08-17 MED ORDER — INSULIN GLARGINE 100 UNIT/ML ~~LOC~~ SOLN: 45.0000 [IU] | Freq: Every day | SUBCUTANEOUS | Status: DC

## 2012-08-17 MED ORDER — INSULIN ASPART 100 UNIT/ML ~~LOC~~ SOLN: 15.0000 [IU] | Freq: Three times a day (TID) | SUBCUTANEOUS | Status: DC

## 2012-08-17 MED ORDER — METFORMIN HCL ER 500 MG PO TB24: 1000.0000 mg | ORAL_TABLET | Freq: Two times a day (BID) | ORAL | Status: DC

## 2012-08-17 MED ORDER — INSULIN GLARGINE 100 UNIT/ML ~~LOC~~ SOLN: 60.0000 [IU] | Freq: Every day | SUBCUTANEOUS | Status: DC

## 2012-08-17 NOTE — ED Notes (Signed)
All triage documentation performed by denise, lpn.

## 2012-08-17 NOTE — ED Notes (Signed)
Pt complains of feeling fatigued dry mouth sob because of weight gain. Pt stated her fasting sugar this am was 250 and has had nothing by mouth at all today.Arvin Collard LPN

## 2012-08-17 NOTE — ED Provider Notes (Signed)
History    CSN: [id number]  Arrival date & time 08/17/12  1508  Chief Complaint  Patient presents with  . Fatigue   HPI  Pt is presenting today for refill of diabetes medications.  She has poorly controlled insulin requiring type 2 DM.  She reports that she was diagnosed about 1-2  Years ago after being started on abilify for bipolar disorder.  She says that her blood sugars have been in the 400 to 500 range.  She is eating breakfast and supper and snacking all day in between meals. She is also eating a meal at 3 am.  She says that she is hungry all the time and has gained large amount of weight. She is taking lantus 36 units before bed and taking 10 units of novolog once per day but is not taking novolog with every meal and not taking it with snacks.  She is tired all the time and has polyuria and polydipsia and nocturia.  She has CHF and bipolar.  She is not exercising.  She is checking her blood sugars 1-3 times per day with readings of 200 - 500 reported.  No meter was brought to today's visit.    Past Medical History  Diagnosis Date  . Insulin dependent type 2 diabetes mellitus, uncontrolled     Uncontrolled, sugar typically 240.   Marland Kitchen Hypertension   . Chronic diastolic CHF (congestive heart failure)     a. 04/2009 Echo: EF 60-65%, nl wall motion, restrictive physiology & decreased diast compliance, Mild MR, mod dil  LA, PASP .;  b. 01/2011 Echo: Nl LV, Gr 2 DD, biatrial enlargement.  Marland Kitchen Psychosis   . Morbid obesity with BMI of 50.0-59.9, adult     Ht: 5\' 7"  Wt: 364 lbs. BMI: 57.0  . ANEMIA NOS   . ANXIETY DISORDER, GENERALIZED   . DEGENERATIVE JOINT DISEASE, KNEE   . DISORDER, BIPOLAR NOS   . HYPOTHYROIDISM NOS   . THYROTOXICOSIS   . OSA (obstructive sleep apnea) 03/06/2011  . Chest pain     a. 2012 Myoview: EF 63%, no isch/infarct    History reviewed.   Family History  Problem Relation Age of Onset  . Heart failure Father   . Heart disease Maternal Grandfather   . Heart  disease Paternal Grandfather   . Stroke Father     History  Substance Use Topics  . Smoking status: Never Smoker   . Smokeless tobacco: Never Used  . Alcohol Use: No    OB History    Grav Para Term Preterm Abortions TAB SAB Ect Mult Living                  Review of Systems  Constitutional: Positive for appetite change, fatigue and unexpected weight change.  HENT: Negative.   Eyes: Negative.   Respiratory: Positive for shortness of breath. Negative for choking, chest tightness, wheezing and stridor.   Cardiovascular: Negative.   Gastrointestinal: Negative.   Genitourinary:       Polyuria, polydipsia and nocturia   Musculoskeletal: Positive for joint swelling and arthralgias.  Neurological: Negative.   Hematological: Negative.   Psychiatric/Behavioral: Negative.     Allergies  Acetaminophen; Caffeine; and Lisinopril  Home Medications   Current Outpatient Rx  Name  Route  Sig  Dispense  Refill  . ALBUTEROL SULFATE HFA 108 (90 BASE) MCG/ACT IN AERS   Inhalation   Inhale 1-2 puffs into the lungs every 6 (six) hours as needed for wheezing.  1 Inhaler   0   . FOSINOPRIL SODIUM 40 MG PO TABS   Oral   Take 40 mg by mouth daily.         . INSULIN ASPART 100 UNIT/ML [place] SOLN   Subcutaneous   Inject 4 Units into the skin 2 (two) times daily with a meal.         . INSULIN GLARGINE 100 UNIT/ML [place] SOLN   Subcutaneous   Inject 36 Units into the skin at bedtime.         Marland Kitchen METFORMIN HCL 500 MG PO TABS   Oral   Take 2 tablets (1,000 mg total) by mouth daily with breakfast.   60 tablet   0   . POTASSIUM CHLORIDE 10 MEQ PO TBCR   Oral   Take 40 mEq by mouth daily.         Marland Kitchen ROSUVASTATIN CALCIUM 10 MG PO TABS   Oral   Take 10 mg by mouth daily.           . TORSEMIDE 20 MG PO TABS   Oral   Take 60 mg by mouth daily.            BP 158/97  Pulse 82  Temp 98.6 F (37 C) (Oral)  Resp 22  SpO2 98%  LMP 05/17/2012  Physical Exam  Constitutional:  She is oriented to person, place, and time. She appears well-developed and well-nourished. No distress.       Morbidly obese female   HENT:  Head: Normocephalic and atraumatic.  Nose: Nose normal.  Eyes: Conjunctivae normal and EOM are normal. Pupils are equal, round, and reactive to light.  Neck: Normal range of motion. Neck supple. No JVD present. No thyromegaly present.  Cardiovascular: Normal rate, regular rhythm and normal heart sounds.   Pulmonary/Chest: Effort normal and breath sounds normal. She has no wheezes.  Abdominal: Soft. Bowel sounds are normal. There is no tenderness.  Musculoskeletal: She exhibits no edema and no tenderness.  Neurological: She is alert and oriented to person, place, and time.  Skin: Skin is warm and dry.  Psychiatric: She has a normal mood and affect. Her behavior is normal. Judgment and thought content normal.    ED Course  Procedures (including critical care time)  Labs Reviewed - No data to display No results found.  BS 161   No diagnosis found.    MDM  IMPRESSION  Uncontrolled type 2 DM  Hypothyroidism (untreated)  Fatigue  Morbid Obesity  Bipolar Disorder  Hyperglycemia  Physical Deconditioning  CHF  Hypertension  Pickwickian Syndrome  OSA  RECOMMENDATIONS / PLAN  Increasing lantus to 45 units every evening with plan to titrate as needed to goal of 60 units Increasing novolog to 15 units with each meal, recommended pt to eat 3 meals per day and to cover all meals with insulin Increasing metformin to ER 500 mg po - to take 2 tabs po bid AC, (she had only been taking one 1000mg  tab daily) Recommend starting a regular walking program to increase exercise capacity and reduce insulin resistance. Recommend testing blood glucose 4 times per day and prn Refilled prescription for Lantus solostar and Novolog flexpen Ordered CMP, A1c, TSH to evaluate for severe fatigue that is likely multifactorial (from thyroid, CHF, DM,  obesity, OSA), pt may need to start thyroid replacement hormone if TSH comes back elevated.  RTC tomorrow morning to meet with patient coordinator for drug assistance program Pt declined flu vaccine today  Diabetes education provided today I explained to the patient that she is going to need close outpatient medical follow up until her diabetes is better controlled.  She verbalized understanding.   If the patient cannot get assistance with her lantus and novolog medications, then we will need to switch her to NPH and regular insulin that she can purchase at cheaper prices through the pharmacy.    FOLLOW UP RTC in 1 month for medical follow up for uncontrolled type 2 DM, severe chronic fatigue and morbid obesity  The patient was given clear instructions to go to ER or return to medical center if symptoms don't improve, worsen or new problems develop.  The patient verbalized understanding.  The patient was told to call to get lab results if they haven't heard anything in the next week.     Rodney Langton, MD, CDE, FAAFP [facility] [place], [place]         Cleora Fleet, MD 08/17/12 [phone]

## 2012-08-18 ENCOUNTER — Telehealth (HOSPITAL_COMMUNITY): Payer: Self-pay | Admitting: Internal Medicine

## 2012-08-18 LAB — TSH: TSH: 4.214 u[IU]/mL (ref 0.350–4.500)

## 2012-08-18 NOTE — Telephone Encounter (Signed)
Message copied by Leroy Sea on Thu Aug 18, 2012  3:10 PM ------      Message from: Vassie Moselle      Created: Thu Aug 18, 2012  2:25 PM      Regarding: Labs                   ----- Message -----         From: Lab In [place] Interface         Sent: 08/17/2012  11:12 PM           To: [facility] Follow Up

## 2012-08-18 NOTE — ED Notes (Signed)
Pt called and message left to take blood sugars before and after meals for 1 week and to call adult care center with results after the end of one week . Return call if any questions.

## 2012-08-22 ENCOUNTER — Emergency Department (INDEPENDENT_AMBULATORY_CARE_PROVIDER_SITE_OTHER)
Admission: EM | Admit: 2012-08-22 | Discharge: 2012-08-22 | Disposition: A | Discharge status: Home / Self Care | Payer: Self-pay | Source: Home / Self Care

## 2012-08-22 ENCOUNTER — Encounter (HOSPITAL_COMMUNITY): Payer: Self-pay

## 2012-08-22 DIAGNOSIS — F319 Bipolar disorder, unspecified: Secondary | ICD-10-CM

## 2012-08-22 DIAGNOSIS — F411 Generalized anxiety disorder: Secondary | ICD-10-CM

## 2012-08-22 DIAGNOSIS — E119 Type 2 diabetes mellitus without complications: Secondary | ICD-10-CM

## 2012-08-22 MED ORDER — ASPIRIN 81 MG PO TBEC: 81.0000 mg | DELAYED_RELEASE_TABLET | Freq: Every day | ORAL | Status: DC

## 2012-08-22 MED ORDER — ROSUVASTATIN CALCIUM 10 MG PO TABS: 10.0000 mg | ORAL_TABLET | Freq: Every day | ORAL | Status: DC

## 2012-08-22 MED ORDER — INSULIN ASPART 100 UNIT/ML ~~LOC~~ SOLN: 15.0000 [IU] | Freq: Three times a day (TID) | SUBCUTANEOUS | Status: DC

## 2012-08-22 MED ORDER — TORSEMIDE 20 MG PO TABS: 60.0000 mg | ORAL_TABLET | Freq: Every day | ORAL | Status: DC

## 2012-08-22 MED ORDER — POTASSIUM CHLORIDE 10 MEQ PO TBCR: 40.0000 meq | EXTENDED_RELEASE_TABLET | Freq: Every day | ORAL | Status: DC

## 2012-08-22 MED ORDER — INSULIN GLARGINE 100 UNIT/ML ~~LOC~~ SOLN: 55.0000 [IU] | Freq: Every day | SUBCUTANEOUS | Status: DC

## 2012-08-22 MED ORDER — FOSINOPRIL SODIUM 40 MG PO TABS: 40.0000 mg | ORAL_TABLET | Freq: Every day | ORAL | Status: DC

## 2012-08-22 NOTE — ED Notes (Signed)
Patient seen in office this past week c/o blood sugar still out of control

## 2012-08-22 NOTE — ED Provider Notes (Signed)
History     CSN: [id number]  Arrival date & time 08/22/12  1341   None     Chief Complaint  Patient presents with  . Follow-up    blood sugar high    (Consider location/radiation/quality/duration/timing/severity/associated sxs/prior treatment) HPI PT presents to follow up for elevated BS.  BS readings are 200-500.  Pt not taking novolog as prescribed.  Only taking novolog 2 times per day and still snacking and not taking novolog.  Pt eating large breakfast and supper and snack at 3 am and snacking throughout daytime.   Reviewed BS readings and fasting BS 190-250,  [place] BS 300-500  Past Medical History  Diagnosis Date  . Insulin dependent type 2 diabetes mellitus, uncontrolled     Uncontrolled, sugar typically 240.   Marland Kitchen Hypertension   . Chronic diastolic CHF (congestive heart failure)     a. 04/2009 Echo: EF 60-65%, nl wall motion, restrictive physiology & decreased diast compliance, Mild MR, mod dil  LA, PASP .;  b. 01/2011 Echo: Nl LV, Gr 2 DD, biatrial enlargement.  Marland Kitchen Psychosis   . Morbid obesity with BMI of 50.0-59.9, adult     Ht: 5\' 7"  Wt: 364 lbs. BMI: 57.0  . ANEMIA NOS   . ANXIETY DISORDER, GENERALIZED   . DEGENERATIVE JOINT DISEASE, KNEE   . DISORDER, BIPOLAR NOS   . HYPOTHYROIDISM NOS   . THYROTOXICOSIS   . OSA (obstructive sleep apnea) 03/06/2011  . Chest pain     a. 2012 Myoview: EF 63%, no isch/infarct    History reviewed. No pertinent past surgical history.  Family History  Problem Relation Age of Onset  . Heart failure Father   . Heart disease Maternal Grandfather   . Heart disease Paternal Grandfather   . Stroke Father     History  Substance Use Topics  . Smoking status: Never Smoker   . Smokeless tobacco: Never Used  . Alcohol Use: No    OB History    Grav Para Term Preterm Abortions TAB SAB Ect Mult Living                  Review of Systems  Constitutional: Negative.   HENT: Positive for congestion and rhinorrhea.   Eyes:  Negative.   Respiratory: Negative.   Cardiovascular: Negative.   Gastrointestinal: Negative.   Musculoskeletal: Positive for back pain and arthralgias.  Neurological: Negative.   Hematological: Negative.   Psychiatric/Behavioral: Negative.     Allergies  Acetaminophen; Caffeine; and Lisinopril  Home Medications   Current Outpatient Rx  Name  Route  Sig  Dispense  Refill  . ALBUTEROL SULFATE HFA 108 (90 BASE) MCG/ACT IN AERS   Inhalation   Inhale 1-2 puffs into the lungs every 6 (six) hours as needed for wheezing.   1 Inhaler   0   . ARIPIPRAZOLE 10 MG PO TABS   Oral   Take 10 mg by mouth daily.         . ASPIRIN 81 MG PO TBEC   Oral   Take 1 tablet (81 mg total) by mouth daily. Swallow whole.   90 tablet   3   . FOSINOPRIL SODIUM 40 MG PO TABS   Oral   Take 1 tablet (40 mg total) by mouth daily.   90 tablet   3   . INSULIN ASPART 100 UNIT/ML [place] SOLN   Subcutaneous   Inject 15 Units into the skin 3 (three) times daily before meals.   3 pen  12   . INSULIN GLARGINE 100 UNIT/ML [place] SOLN   Subcutaneous   Inject 45 Units into the skin at bedtime.   4 pen   12   . METFORMIN HCL [place] 500 MG PO TB24   Oral   Take 2 tablets (1,000 mg total) by mouth 2 (two) times daily with a meal.   120 tablet   3   . POTASSIUM CHLORIDE 10 MEQ PO TBCR   Oral   Take 4 tablets (40 mEq total) by mouth daily.   120 tablet   3   . ROSUVASTATIN CALCIUM 10 MG PO TABS   Oral   Take 1 tablet (10 mg total) by mouth daily.   90 tablet   3   . TORSEMIDE 20 MG PO TABS   Oral   Take 3 tablets (60 mg total) by mouth daily.   90 tablet   3     BP 148/123  Pulse 88  Temp 98.6 F (37 C) (Oral)  Resp 22  SpO2 99%  LMP 05/17/2012  Physical Exam  Constitutional: She is oriented to person, place, and time. She appears well-developed and well-nourished. She appears distressed.  HENT:  Head: Normocephalic and atraumatic.  Eyes: Pupils are equal, round, and reactive to light.  Right eye exhibits no discharge. No scleral icterus.  Neck: Normal range of motion. Neck supple. No thyromegaly present.  Cardiovascular: Normal rate and regular rhythm.   Pulmonary/Chest: Effort normal and breath sounds normal.  Abdominal: Soft. Bowel sounds are normal. She exhibits no distension and no mass. There is no guarding.  Musculoskeletal: Normal range of motion. She exhibits no edema and no tenderness.  Neurological: She is alert and oriented to person, place, and time. No cranial nerve deficit. Coordination normal.  Skin: Skin is warm and dry.  Psychiatric: She has a normal mood and affect. Her behavior is normal. Thought content normal.    ED Course  Procedures (including critical care time)  Labs Reviewed - No data to display No results found.   1. DM   2. DISORDER, BIPOLAR NOS   3. ANXIETY DISORDER, GENERALIZED       MDM  IMPRESSION  Poorly controlled type 2 diabetes, insulin requiring  Hypertension  Bipolar Disorder  Morbid Obesity   RECOMMENDATIONS / PLAN  Increased lantus to 55 units, increase novolog to 15 units TIDAC  Continue metformin 1000 mg po bid Continue testing BS 4 times per day  FOLLOW UP 1 month  The patient was given clear instructions to go to ER or return to medical center if symptoms don't improve, worsen or new problems develop.  The patient verbalized understanding.  The patient was told to call to get lab results if they haven't heard anything in the next week.            Cleora Fleet, MD 08/22/12 2158

## 2012-08-31 ENCOUNTER — Other Ambulatory Visit: Payer: Self-pay

## 2012-08-31 MED ORDER — FOSINOPRIL SODIUM 40 MG PO TABS: 40.0000 mg | ORAL_TABLET | Freq: Every day | ORAL | Status: DC

## 2012-09-05 ENCOUNTER — Other Ambulatory Visit (HOSPITAL_COMMUNITY): Payer: Self-pay | Admitting: *Deleted

## 2012-09-05 MED ORDER — FOSINOPRIL SODIUM 40 MG PO TABS: 40.0000 mg | ORAL_TABLET | Freq: Every day | ORAL | Status: DC

## 2012-09-16 ENCOUNTER — Encounter (HOSPITAL_COMMUNITY): Payer: Self-pay

## 2012-09-16 ENCOUNTER — Emergency Department (INDEPENDENT_AMBULATORY_CARE_PROVIDER_SITE_OTHER)
Admission: EM | Admit: 2012-09-16 | Discharge: 2012-09-16 | Disposition: A | Discharge status: Home / Self Care | Payer: No Typology Code available for payment source | Source: Home / Self Care

## 2012-09-16 DIAGNOSIS — E039 Hypothyroidism, unspecified: Secondary | ICD-10-CM

## 2012-09-16 DIAGNOSIS — F411 Generalized anxiety disorder: Secondary | ICD-10-CM

## 2012-09-16 DIAGNOSIS — J329 Chronic sinusitis, unspecified: Secondary | ICD-10-CM

## 2012-09-16 DIAGNOSIS — F319 Bipolar disorder, unspecified: Secondary | ICD-10-CM

## 2012-09-16 DIAGNOSIS — G4733 Obstructive sleep apnea (adult) (pediatric): Secondary | ICD-10-CM

## 2012-09-16 DIAGNOSIS — I5032 Chronic diastolic (congestive) heart failure: Secondary | ICD-10-CM

## 2012-09-16 MED ORDER — AMOXICILLIN 875 MG PO TABS: 875.0000 mg | ORAL_TABLET | Freq: Two times a day (BID) | ORAL | Status: DC

## 2012-09-16 NOTE — ED Provider Notes (Signed)
History     CSN: [id number]  Arrival date & time 09/16/12  1003   None     Chief Complaint  Patient presents with  . Follow-up    (Consider location/radiation/quality/duration/timing/severity/associated sxs/prior treatment) The history is provided by the patient. No language interpreter was used.   Pt reports that her BS 120-150 before meals and 180-200 after meals, she didn't bring log or meter to clinic today Pt is taking novolog 15 units every meal  Pt is taking lantus 55 units at every evening.  Pt is having sinus congestion and drainage and cough.  Pt has had sinus pain and pressure and drainage and bloody drainage from sinuses at times and bad taste in mouth  Pt has not taken any of her meds today   Past Medical History  Diagnosis Date  . Insulin dependent type 2 diabetes mellitus, uncontrolled     Uncontrolled, sugar typically 240.   Marland Kitchen Hypertension   . Chronic diastolic CHF (congestive heart failure)     a. 04/2009 Echo: EF 60-65%, nl wall motion, restrictive physiology & decreased diast compliance, Mild MR, mod dil  LA, PASP .;  b. 01/2011 Echo: Nl LV, Gr 2 DD, biatrial enlargement.  Marland Kitchen Psychosis   . Morbid obesity with BMI of 50.0-59.9, adult     Ht: 5\' 7"  Wt: 364 lbs. BMI: 57.0  . ANEMIA NOS   . ANXIETY DISORDER, GENERALIZED   . DEGENERATIVE JOINT DISEASE, KNEE   . DISORDER, BIPOLAR NOS   . HYPOTHYROIDISM NOS   . THYROTOXICOSIS   . OSA (obstructive sleep apnea) 03/06/2011  . Chest pain     a. 2012 Myoview: EF 63%, no isch/infarct    History reviewed. No pertinent past surgical history.  Family History  Problem Relation Age of Onset  . Heart failure Father   . Heart disease Maternal Grandfather   . Heart disease Paternal Grandfather   . Stroke Father     History  Substance Use Topics  . Smoking status: Never Smoker   . Smokeless tobacco: Never Used  . Alcohol Use: No    OB History    Grav Para Term Preterm Abortions TAB SAB Ect Mult Living                Review of Systems  HENT: Positive for nosebleeds, congestion and rhinorrhea.   Eyes: Negative.   Respiratory: Negative.   Cardiovascular: Negative.   Gastrointestinal: Negative.     Allergies  Acetaminophen; Caffeine; and Lisinopril  Home Medications   Current Outpatient Rx  Name  Route  Sig  Dispense  Refill  . ALBUTEROL SULFATE HFA 108 (90 BASE) MCG/ACT IN AERS   Inhalation   Inhale 1-2 puffs into the lungs every 6 (six) hours as needed for wheezing.   1 Inhaler   0   . ARIPIPRAZOLE 10 MG PO TABS   Oral   Take 10 mg by mouth daily.         . ASPIRIN 81 MG PO TBEC   Oral   Take 1 tablet (81 mg total) by mouth daily. Swallow whole.   90 tablet   3   . FOSINOPRIL SODIUM 40 MG PO TABS   Oral   Take 1 tablet (40 mg total) by mouth daily.   90 tablet   0   . INSULIN ASPART 100 UNIT/ML [place] SOLN   Subcutaneous   Inject 15 Units into the skin 3 (three) times daily before meals. Don't take if you don't  eat   3 pen   12   . INSULIN GLARGINE 100 UNIT/ML [place] SOLN   Subcutaneous   Inject 55 Units into the skin at bedtime.   4 pen   12   . METFORMIN HCL ER 500 MG PO TB24   Oral   Take 2 tablets (1,000 mg total) by mouth 2 (two) times daily with a meal.   120 tablet   3   . POTASSIUM CHLORIDE 10 MEQ PO TBCR   Oral   Take 4 tablets (40 mEq total) by mouth daily.   120 tablet   3   . ROSUVASTATIN CALCIUM 10 MG PO TABS   Oral   Take 1 tablet (10 mg total) by mouth daily.   90 tablet   3   . TORSEMIDE 20 MG PO TABS   Oral   Take 3 tablets (60 mg total) by mouth daily.   90 tablet   3     BP 176/99  Pulse 100  Temp 97.7 F (36.5 C) (Oral)  Resp 18  SpO2 93%  Physical Exam  Nursing note and vitals reviewed. Constitutional: She is oriented to person, place, and time. She appears well-developed and well-nourished. She appears distressed.  HENT:  Head: Normocephalic and atraumatic.  Nose: Mucosal edema and rhinorrhea present. Right sinus  exhibits frontal sinus tenderness. Left sinus exhibits frontal sinus tenderness.  Eyes: EOM are normal. Pupils are equal, round, and reactive to light.  Neck: Normal range of motion. Neck supple.  Cardiovascular: Normal rate, regular rhythm and normal heart sounds.   Pulmonary/Chest: Effort normal and breath sounds normal.  Abdominal: Soft. Bowel sounds are normal.  Musculoskeletal: Normal range of motion.  Neurological: She is alert and oriented to person, place, and time.  Skin: Skin is warm and dry.  Psychiatric: She has a normal mood and affect. Her behavior is normal. Judgment and thought content normal.    ED Course  Procedures (including critical care time)  Labs Reviewed - No data to display No results found.  No diagnosis found.  MDM  IMPRESSION Type 2 DM, poorly controlled, insulin requiring  HTN, pt hasn't taken meds today Compensated CHF sinusitus - acute bacterial Perimenopausal symptoms Poor compliance  RECOMMENDATIONS / PLAN  Amoxicillin 875 mg po bid  Continue Lantus 55 units and novolog 15 units TIDAC , continue close monitoring of BS but need to bring numbers and meter to next office visit Encouraged walking program for pt to start walking everyday a little further each day Encouraged pt to follow up with her cardiologist Referral to Gyn for exam and hormone replacement consideration  FOLLOW UP 3 months  The patient was given clear instructions to go to ER or return to medical center if symptoms don't improve, worsen or new problems develop.  The patient verbalized understanding.  The patient was told to call to get lab results if they haven't heard anything in the next week.            Cleora Fleet, MD 09/16/12 1037

## 2012-09-16 NOTE — ED Notes (Signed)
Follow up DM

## 2012-10-27 ENCOUNTER — Ambulatory Visit (HOSPITAL_COMMUNITY)
Admission: RE | Admit: 2012-10-27 | Discharge: 2012-10-27 | Disposition: A | Discharge status: Home / Self Care | Payer: No Typology Code available for payment source | Source: Ambulatory Visit | Attending: Internal Medicine | Admitting: Internal Medicine

## 2012-10-27 ENCOUNTER — Encounter (HOSPITAL_COMMUNITY): Payer: Self-pay

## 2012-10-27 ENCOUNTER — Encounter: Payer: Self-pay | Admitting: Cardiology

## 2012-10-27 VITALS — BP 156/94 | HR 93 | Wt 363.8 lb

## 2012-10-27 DIAGNOSIS — I5032 Chronic diastolic (congestive) heart failure: Secondary | ICD-10-CM

## 2012-10-27 DIAGNOSIS — R072 Precordial pain: Secondary | ICD-10-CM | POA: Insufficient documentation

## 2012-10-27 DIAGNOSIS — R0789 Other chest pain: Secondary | ICD-10-CM

## 2012-10-27 DIAGNOSIS — I509 Heart failure, unspecified: Secondary | ICD-10-CM

## 2012-10-27 DIAGNOSIS — R079 Chest pain, unspecified: Secondary | ICD-10-CM

## 2012-10-27 NOTE — Assessment & Plan Note (Addendum)
With diabetes and hypertension there is some concern for cardiac chest pain.  Have discussed options for evaluation of chest pain which include stress test vs catheterization.  She has decided to have proceed with stress test.  Will schedule 2 day lexiscan myoview next week.   Patient seen and examined with Ulyess Blossom, PA-C. We discussed all aspects of the encounter. I agree with the assessment and plan as stated above.  Given her age and premenopausal status, likelihood for critical CAD is low; that said, symptoms fairly typical for angina. Given risk factors needs further risk stratification. Discussed cath versus stress test. She would like to proceed with Myoview. She is aware of reduced sensitivity/specificty in patients with her body habitus.

## 2012-10-27 NOTE — Assessment & Plan Note (Addendum)
Volume status stable.  Will continue current regimen.  Main issue remains morbid obesity.  Have discussed importance of weight loss and need to find a program to work with her.    Attending: Agreed. Very hard to assess volume status but looks ok.

## 2012-10-27 NOTE — Patient Instructions (Addendum)
Your physician has requested that you have a lexiscan myoview. For further information please visit [email or web address] Please follow instruction sheet, as given.  We will call you with the results

## 2012-10-27 NOTE — Progress Notes (Signed)
HPI:  [name] is a 47 year old female with chronic diastolic heart failure, hyperlipidemia, uncontrolled diabetes type 2, morbid obesity with a BMI greater than 40, and OSA.    Echo 5/12: Left ventricle: The cavity size was normal. Wall thickness was increased in a pattern of mild LVH. Systolic function was normal. The estimated ejection fraction was in the range of 60% to 65%.  Possible basal inferior hypokinesis. Features are consistent with a pseudonormal left ventricular filling pattern, with concomitant abnormal relaxation and increased filling pressure (grade 2 diastolic dysfunction).  She was admitted to [facility] 06/2011 and diuresed ~14 kg with improvement in her symptoms of SOB/orthopnea/lower extremity edema.  She was discharged on Lasix 120 mg TID and metolazone.  She was unable to obtain the metolazone from [facility], as well as bisoprolol.    She returns for follow up today with her mother.  She says her breathing has gotten much worse over the last 2 weeks as well as increase in left sided chest pain.  Pain is stabbing sensation lasting ~30 minutes that comes with movement but resolves with rest.  No radiation of pain.  She also notes she has had increased cough and wheezing over this time.  She says she can not afford diet class and has stopped going to psychiatrist because she does not feel depressed.  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  She was turned down for gastric bypass due to being an emotional eater.  She sleeps on sofa because she can't lay in bed.  She denies fever/chills.    ROS: All other systems normal except as mentioned in HPI, past medical history and problem list.    Past Medical History  Diagnosis Date  . Insulin dependent type 2 diabetes mellitus, uncontrolled     Uncontrolled, sugar typically 240.   Marland Kitchen Hypertension   . Chronic diastolic CHF (congestive heart failure)     a. 04/2009 Echo: EF 60-65%, nl wall motion, restrictive physiology & decreased diast  compliance, Mild MR, mod dil  LA, PASP .;  b. 01/2011 Echo: Nl LV, Gr 2 DD, biatrial enlargement.  Marland Kitchen Psychosis   . Morbid obesity with BMI of 50.0-59.9, adult     Ht: 5\' 7"  Wt: 364 lbs. BMI: 57.0  . ANEMIA NOS   . ANXIETY DISORDER, GENERALIZED   . DEGENERATIVE JOINT DISEASE, KNEE   . DISORDER, BIPOLAR NOS   . HYPOTHYROIDISM NOS   . THYROTOXICOSIS   . OSA (obstructive sleep apnea) 03/06/2011  . Chest pain     a. 2012 Myoview: EF 63%, no isch/infarct    Current Outpatient Prescriptions  Medication Sig Dispense Refill  . fosinopril (MONOPRIL) 40 MG tablet Take 1 tablet (40 mg total) by mouth daily.  90 tablet  0  . insulin glargine (LANTUS SOLOSTAR) 100 UNIT/ML injection Inject 55 Units into the skin at bedtime.  4 pen  12  . metFORMIN (GLUCOPHAGE-XR) 500 MG 24 hr tablet Take 2 tablets (1,000 mg total) by mouth 2 (two) times daily with a meal.  120 tablet  3  . potassium chloride (KLOR-CON) 10 MEQ CR tablet Take 4 tablets (40 mEq total) by mouth daily.  120 tablet  3  . rosuvastatin (CRESTOR) 10 MG tablet Take 1 tablet (10 mg total) by mouth daily.  90 tablet  3  . torsemide (DEMADEX) 20 MG tablet Take 3 tablets (60 mg total) by mouth daily.  90 tablet  3  . albuterol (PROVENTIL HFA;VENTOLIN HFA) 108 (90  BASE) MCG/ACT inhaler Inhale 1-2 puffs into the lungs every 6 (six) hours as needed for wheezing.  1 Inhaler  0  . amoxicillin (AMOXIL) 875 MG tablet Take 1 tablet (875 mg total) by mouth 2 (two) times daily.  20 tablet  0  . ARIPiprazole (ABILIFY) 10 MG tablet Take 10 mg by mouth daily.      Marland Kitchen aspirin 81 MG EC tablet Take 1 tablet (81 mg total) by mouth daily. Swallow whole.  90 tablet  3  . insulin aspart (NOVOLOG FLEXPEN) 100 UNIT/ML injection Inject 15 Units into the skin 3 (three) times daily before meals. Don't take if you don't eat  3 pen  12     Allergies  Allergen Reactions  . Acetaminophen     Fits as a child "seizures-like"  . Caffeine     Tense, anxiety, increased  urination  . Lisinopril     Rash with lisinopril; but fosinopril is ok per patient     PHYSICAL EXAM: Filed Vitals:   10/27/12 1133  BP: 156/94  Pulse: 93  Weight: 363 lb 12.8 oz (165.019 kg)  SpO2: 89%    General:  Morbidly obese, No respiratory difficulty. Mom present HEENT: normal Neck: supple. JVP difficult to assess but does not appear elevated. Carotids 2+ bilat; no bruits. No lymphadenopathy or thryomegaly appreciated. Cor: PMI nondisplaced. Regular rate & rhythm. No rubs, gallops or murmurs. Lungs: Decreased in the bases.  Abdomen: ++obese, soft, nontender, nondistended. No hepatosplenomegaly. No bruits or masses. Good bowel sounds. Extremities: no cyanosis, clubbing, rash, trace edema Neuro: alert & oriented x 3, cranial nerves grossly intact. moves all 4 extremities w/o difficulty. Affect pleasant.    ASSESSMENT & PLAN:

## 2012-10-30 NOTE — Assessment & Plan Note (Signed)
This is life threatening for her. We discussed at length. She is aware that she is a severe overeater but cannot stop. Suggested enrolling in OA or getting counseling.

## 2012-10-31 ENCOUNTER — Ambulatory Visit (HOSPITAL_COMMUNITY): Payer: No Typology Code available for payment source | Attending: Cardiovascular Disease | Admitting: Radiology

## 2012-10-31 VITALS — BP 162/108 | HR 44 | Ht 67.0 in | Wt 262.0 lb

## 2012-10-31 DIAGNOSIS — R079 Chest pain, unspecified: Secondary | ICD-10-CM

## 2012-10-31 DIAGNOSIS — R0789 Other chest pain: Secondary | ICD-10-CM | POA: Insufficient documentation

## 2012-10-31 DIAGNOSIS — E119 Type 2 diabetes mellitus without complications: Secondary | ICD-10-CM | POA: Insufficient documentation

## 2012-10-31 DIAGNOSIS — R0989 Other specified symptoms and signs involving the circulatory and respiratory systems: Secondary | ICD-10-CM | POA: Insufficient documentation

## 2012-10-31 DIAGNOSIS — R0602 Shortness of breath: Secondary | ICD-10-CM

## 2012-10-31 DIAGNOSIS — I10 Essential (primary) hypertension: Secondary | ICD-10-CM | POA: Insufficient documentation

## 2012-10-31 DIAGNOSIS — R0609 Other forms of dyspnea: Secondary | ICD-10-CM | POA: Insufficient documentation

## 2012-10-31 MED ORDER — TECHNETIUM TC 99M SESTAMIBI GENERIC - CARDIOLITE
33.0000 | Freq: Once | INTRAVENOUS | Status: AC | PRN
  Administered 2012-10-31: 33 via INTRAVENOUS

## 2012-11-01 ENCOUNTER — Ambulatory Visit (HOSPITAL_COMMUNITY): Payer: No Typology Code available for payment source | Attending: Cardiology | Admitting: Radiology

## 2012-11-01 DIAGNOSIS — R0989 Other specified symptoms and signs involving the circulatory and respiratory systems: Secondary | ICD-10-CM

## 2012-11-01 DIAGNOSIS — R079 Chest pain, unspecified: Secondary | ICD-10-CM | POA: Insufficient documentation

## 2012-11-01 MED ORDER — AMINOPHYLLINE 25 MG/ML IV SOLN
75.0000 mg | Freq: Once | INTRAVENOUS | Status: AC
  Administered 2012-11-01: 75 mg via INTRAVENOUS

## 2012-11-01 MED ORDER — REGADENOSON 0.4 MG/5ML IV SOLN
0.4000 mg | Freq: Once | INTRAVENOUS | Status: AC
  Administered 2012-11-01: 0.4 mg via INTRAVENOUS

## 2012-11-01 MED ORDER — TECHNETIUM TC 99M SESTAMIBI GENERIC - CARDIOLITE
33.0000 | Freq: Once | INTRAVENOUS | Status: AC | PRN
Start: 2012-11-01 — End: 2012-11-01
  Administered 2012-11-01: 33 via INTRAVENOUS

## 2012-11-01 NOTE — Progress Notes (Deleted)
[facility] SITE 3 NUCLEAR MED [address] [place], [place] [address] [phone]    Cardiology Nuclear Med Study  [name] is a 47 y.o. female     MRN : [id number]     DOB: July 25, 1966  Procedure Date: 11/01/2012  Nuclear Med Background Indication for Stress Test:  Evaluation for Ischemia History:  CHF Cardiac Risk Factors: Hypertension, IDDM Type 2, Lipids and Obesity  Symptoms:  Chest Pain   Nuclear Pre-Procedure Caffeine/Decaff Intake:  None NPO After: 12:00am   Lungs:  clear O2 Sat: 94% on room air. IV 0.9% NS with Angio Cath:  20g  IV Site: R Hand  IV Started by:  Cathlyn Parsons, RN  Chest Size (in):  40 Cup Size: 40c  Height:    Weight:     BMI:  There is no height or weight on file to calculate BMI. Tech Comments:  NA    Nuclear Med Study 1 or 2 day study: 2 day  Stress Test Type:  Lexiscan  Reading MD: Olga Millers, MD  Order Authorizing Provider:  Bensimhon  Resting Radionuclide: Technetium 15m Sestamibi  Resting Radionuclide Dose: 30 mCi   Stress Radionuclide:  Technetium 11m Sestamibi  Stress Radionuclide Dose: 30 mCi           Stress Protocol Rest HR: 73 Stress HR: 82  Rest BP: 162/108 Stress BP: 155/93  Exercise Time (min): 2:45 METS: n/a   Predicted Max HR: 176 bpm % Max HR: 52.84 bpm Rate Pressure Product: 02725    Dose of Adenosine (mg):  n/a Dose of Lexiscan: 0.4 mg  Dose of Atropine (mg): n/a Dose of Dobutamine: n/a mcg/kg/min (at max HR)  Stress Test Technologist: Smiley Houseman, CMA-N  Nuclear Technologist:  Domenic Polite, CNMT     Rest Procedure:  Myocardial perfusion imaging was performed at rest 45 minutes following the intravenous administration of Technetium 3m Sestamibi. Rest ECG: NSR, RAD, biatrial enlargement  Stress Procedure:  The patient received IV Lexiscan 0.4 mg over 15-seconds.  Technetium 26m Sestamibi injected at 30-seconds.  Quantitative spect images were obtained after a 45 minute delay. Stress  ECG: No significant ST segment change suggestive of ischemia.  QPS Raw Data Images:  Acquisition technically good; normal left ventricular size. Stress Images:  There is decreased uptake in the inferior wall. Rest Images:  There is decreased uptake in the inferior wall. Subtraction (SDS):  No evidence of ischemia. Transient Ischemic Dilatation (Normal <1.22):  1.14 Lung/Heart Ratio (Normal <0.45):  0.45   Quantitative Gated Spect Images QGS EDV:  106 ml QGS ESV:  31 ml  Impression Exercise Capacity:  Lexiscan with no exercise. BP Response:  Normal blood pressure response. Clinical Symptoms:  There is dyspnea and chest pain. ECG Impression:  No significant ST segment change suggestive of ischemia, transient ectopic atrial rhythm. Comparison with Prior Nuclear Study: No images to compare  Overall Impression:  Normal stress nuclear study with a small, fixed, inferoseptal defect consistent with soft tissue attenuation; no ischemia.  LV Ejection Fraction: 71.  LV Wall Motion:  NL LV Function; NL Wall Motion

## 2012-11-01 NOTE — Progress Notes (Signed)
[facility] SITE 3 NUCLEAR MED [address] [place], [place] [address] [phone]    Cardiology Nuclear Med Study  [name] is a 47 y.o. female     MRN : [id number]     DOB: [birth year]  Procedure Date: 11/01/2012  Nuclear Med Background Indication for Stress Test:  Evaluation for Ischemia History:  '12 OZD:GUYQIH, EF=63%; '12 Echo:EF=65% Cardiac Risk Factors: Hypertension, IDDM Type 2, Lipids and Obesity  Symptoms:  Chest Pain/Pressure/Tightness with and without Exertion (last episode of chest discomfort was about 2-days ago; then she c/o pain/pressure 8/10 while being connected to electrodes with deep breath), Diaphoresis, DOE/SOB, Fatigue, Nausea and Rapid HR    Nuclear Pre-Procedure Caffeine/Decaff Intake:  None NPO After: 12:30am   Lungs:  Clear. O2 Sat: 95% on room air. IV 0.9% NS with Angio Cath:  24g  IV Site: R Hand  IV Started by:  Cathlyn Parsons, RN  Chest Size (in):  40 Cup Size: C  Height: 5\' 7"  (1.702 m)  Weight:  262 lb (118.842 kg)  BMI:  Body mass index is 41.03 kg/(m^2). Tech Comments:  No med's taken today, per pt.    Nuclear Med Study 1 or 2 day study: 2 day  Stress Test Type:  Eugenie Birks  Reading MD: Charlton Haws, MD  Order Authorizing Provider:  Arvilla Meres, MD  Resting Radionuclide: Technetium 25m Sestamibi  Resting Radionuclide Dose: 33.0 mCi on 10/31/12   Stress Radionuclide:  Technetium 31m Sestamibi  Stress Radionuclide Dose: 33.0 mCi on 11/01/12           Stress Protocol Rest HR: 73 Stress HR: 93  Rest BP: 162/108 Stress BP: 173/100  Exercise Time (min): n/a METS: n/a   Predicted Max HR: 176 bpm % Max HR: 52.84 bpm Rate Pressure Product: 47425    Dose of Adenosine (mg):  n/a Dose of Lexiscan: 0.4 mg Dose of Aminophylline:75 mg  Dose of Atropine (mg): n/a Dose of Dobutamine: n/a mcg/kg/min (at max HR)  Stress Test Technologist: Smiley Houseman, CMA-N  Nuclear Technologist:  Domenic Polite, CNMT     Rest Procedure:   Myocardial perfusion imaging was performed at rest 45 minutes following the intravenous administration of Technetium 56m Sestamibi.  Rest ECG: NSR, RAD, biatrial enlargement.  Stress Procedure:  The patient received IV Lexiscan 0.4 mg over 15-seconds.  Technetium 57m Sestamibi injected at 30-seconds.  The patient c/o chest pain/pressure, pain all over, nausea and heart pulsating with Lexiscan.  She was given Aminophylline 75 mg with relief.  Quantitative spect images were obtained after a 45 minute delay.  Stress ECG: No significant ST segment change suggestive of ischemia.  QPS Raw Data Images:  Acquisition technically good; normal left ventricular size. Stress Images:  There is decreased uptake in the inferior wall. Rest Images:  There is decreased uptake in the inferior wall. Subtraction (SDS):  No evidence of ischemia. Transient Ischemic Dilatation (Normal <1.22):  1.14 Lung/Heart Ratio (Normal <0.45):  0.45  Quantitative Gated Spect Images QGS EDV:  106 ml QGS ESV:  31 ml  Impression Exercise Capacity:  Lexiscan with no exercise. BP Response:  Normal blood pressure response. Clinical Symptoms:  There is dyspnea and chest pain. ECG Impression:  No significant ST segment change suggestive of ischemia. Comparison with Prior Nuclear Study: No images to compare  Overall Impression:  Normal stress nuclear study with a small, mild, fixed inferoseptal defect suggestive of soft tissue attenuation but no ischemia.  LV Ejection Fraction: 71%.  LV Wall  Motion:  NL LV Function; NL Wall Motion  Olga Millers

## 2013-01-28 ENCOUNTER — Other Ambulatory Visit (HOSPITAL_COMMUNITY): Payer: Self-pay | Admitting: Internal Medicine

## 2013-02-23 ENCOUNTER — Other Ambulatory Visit: Payer: Self-pay

## 2013-02-23 ENCOUNTER — Emergency Department (HOSPITAL_COMMUNITY): Payer: Self-pay

## 2013-02-23 ENCOUNTER — Encounter (HOSPITAL_COMMUNITY): Payer: Self-pay | Admitting: Emergency Medicine

## 2013-02-23 ENCOUNTER — Emergency Department (HOSPITAL_COMMUNITY)
Admission: EM | Admit: 2013-02-23 | Discharge: 2013-02-23 | Disposition: A | Discharge status: Home / Self Care | Payer: Self-pay | Attending: Emergency Medicine | Admitting: Emergency Medicine

## 2013-02-23 DIAGNOSIS — Z8669 Personal history of other diseases of the nervous system and sense organs: Secondary | ICD-10-CM | POA: Insufficient documentation

## 2013-02-23 DIAGNOSIS — Z6841 Body Mass Index (BMI) 40.0 and over, adult: Secondary | ICD-10-CM | POA: Insufficient documentation

## 2013-02-23 DIAGNOSIS — Z91199 Patient's noncompliance with other medical treatment and regimen due to unspecified reason: Secondary | ICD-10-CM | POA: Insufficient documentation

## 2013-02-23 DIAGNOSIS — Z8659 Personal history of other mental and behavioral disorders: Secondary | ICD-10-CM | POA: Insufficient documentation

## 2013-02-23 DIAGNOSIS — Z862 Personal history of diseases of the blood and blood-forming organs and certain disorders involving the immune mechanism: Secondary | ICD-10-CM | POA: Insufficient documentation

## 2013-02-23 DIAGNOSIS — I10 Essential (primary) hypertension: Secondary | ICD-10-CM | POA: Insufficient documentation

## 2013-02-23 DIAGNOSIS — I5032 Chronic diastolic (congestive) heart failure: Secondary | ICD-10-CM | POA: Insufficient documentation

## 2013-02-23 DIAGNOSIS — R059 Cough, unspecified: Secondary | ICD-10-CM | POA: Insufficient documentation

## 2013-02-23 DIAGNOSIS — Z8739 Personal history of other diseases of the musculoskeletal system and connective tissue: Secondary | ICD-10-CM | POA: Insufficient documentation

## 2013-02-23 DIAGNOSIS — Z9119 Patient's noncompliance with other medical treatment and regimen: Secondary | ICD-10-CM | POA: Insufficient documentation

## 2013-02-23 DIAGNOSIS — R0602 Shortness of breath: Secondary | ICD-10-CM | POA: Insufficient documentation

## 2013-02-23 DIAGNOSIS — R05 Cough: Secondary | ICD-10-CM | POA: Insufficient documentation

## 2013-02-23 DIAGNOSIS — Z8639 Personal history of other endocrine, nutritional and metabolic disease: Secondary | ICD-10-CM | POA: Insufficient documentation

## 2013-02-23 DIAGNOSIS — IMO0001 Reserved for inherently not codable concepts without codable children: Secondary | ICD-10-CM | POA: Insufficient documentation

## 2013-02-23 DIAGNOSIS — Z8679 Personal history of other diseases of the circulatory system: Secondary | ICD-10-CM | POA: Insufficient documentation

## 2013-02-23 DIAGNOSIS — Z794 Long term (current) use of insulin: Secondary | ICD-10-CM | POA: Insufficient documentation

## 2013-02-23 DIAGNOSIS — Z79899 Other long term (current) drug therapy: Secondary | ICD-10-CM | POA: Insufficient documentation

## 2013-02-23 LAB — COMPREHENSIVE METABOLIC PANEL
ALT: 13 U/L (ref 0–35)
AST: 26 U/L (ref 0–37)
Albumin: 3.3 g/dL — ABNORMAL LOW (ref 3.5–5.2)
Alkaline Phosphatase: 113 U/L (ref 39–117)
Chloride: 101 mEq/L (ref 96–112)
Potassium: 3.6 mEq/L (ref 3.5–5.1)
Total Bilirubin: 0.6 mg/dL (ref 0.3–1.2)

## 2013-02-23 LAB — CBC WITH DIFFERENTIAL/PLATELET
Basophils Absolute: 0 10*3/uL (ref 0.0–0.1)
Basophils Relative: 0 % (ref 0–1)
Hemoglobin: 12.2 g/dL (ref 12.0–15.0)
MCHC: 32 g/dL (ref 30.0–36.0)
Neutro Abs: 5.2 10*3/uL (ref 1.7–7.7)
Neutrophils Relative %: 74 % (ref 43–77)
RDW: 14.8 % (ref 11.5–15.5)
WBC: 7.1 10*3/uL (ref 4.0–10.5)

## 2013-02-23 LAB — POCT I-STAT TROPONIN I: Troponin i, poc: 0.01 ng/mL (ref 0.00–0.08)

## 2013-02-23 LAB — CBC
HCT: 38.2 % (ref 36.0–46.0)
Hemoglobin: 12.5 g/dL (ref 12.0–15.0)
MCH: 28.4 pg (ref 26.0–34.0)
MCHC: 32.7 g/dL (ref 30.0–36.0)

## 2013-02-23 MED ORDER — FUROSEMIDE 10 MG/ML IJ SOLN
80.0000 mg | Freq: Once | INTRAMUSCULAR | Status: AC
  Administered 2013-02-23: 80 mg via INTRAVENOUS
  Filled 2013-02-23: qty 8

## 2013-02-23 MED ORDER — POTASSIUM CHLORIDE ER 10 MEQ PO TBCR: 40.0000 meq | EXTENDED_RELEASE_TABLET | Freq: Once | ORAL | Status: DC

## 2013-02-23 MED ORDER — NITROGLYCERIN IN D5W 200-5 MCG/ML-% IV SOLN
10.0000 ug/min | INTRAVENOUS | Status: DC
  Administered 2013-02-23: 10 ug/min via INTRAVENOUS
  Filled 2013-02-23: qty 250

## 2013-02-23 MED ORDER — TORSEMIDE 20 MG PO TABS: 60.0000 mg | ORAL_TABLET | Freq: Two times a day (BID) | ORAL | Status: DC

## 2013-02-23 NOTE — ED Notes (Signed)
Pt approximately 121ft on portable pulse ox. O2 sats maintained btwn 92%-96% on room air.

## 2013-02-23 NOTE — ED Provider Notes (Signed)
  Physical Exam  BP 163/87  Pulse 80  Temp(Src) 98.5 F (36.9 C) (Oral)  Resp 25  Wt 374 lb 9 oz (169.9 kg)  BMI 58.65 kg/m2  SpO2 95%  Physical Exam Patient has been able to ambulate in the hall maintaining her sats at 95% on room air.  On further examination, she, states, that she is out of her potassium, and Demadex.  A right prescription for this and encouraged her to follow up with Dr. Jens Som in June per her routine every 6 month office visit ED Course  Procedures  MDM      Arman Filter, NP 02/23/13 2107

## 2013-02-23 NOTE — ED Notes (Signed)
Pt st's she started having SOB 2 weeks ago but got worse yesterday.  Pt st's she has had productive cough with frothy sputum.  Also c/o chest heaviness and has vomited x's 2 t oday.  Pt is tachypneic in triage but speaking in full sentences.

## 2013-02-23 NOTE — ED Notes (Signed)
Pt reports not taking her Lasix and blood pressure medication as prescribed. States that Lasix makes her pee all the time and wake her up at night. Pt states that she has had SOB for past two weeks but did not notify her PCP of her condition.

## 2013-02-23 NOTE — ED Provider Notes (Signed)
History     CSN: [id number]  Arrival date & time 02/23/13  1551   First MD Initiated Contact with Patient 02/23/13 1659      Chief Complaint  Patient presents with  . Shortness of Breath    (Consider location/radiation/quality/duration/timing/severity/associated sxs/prior treatment) HPI Pt with history of CHF, DM, HTN, presents with DOE, orthopnea, non-productive cough. Pt admits non-compliance with medication. No new lower ext swelling or pain. No chest pain. No fever or chills.  Past Medical History  Diagnosis Date  . Insulin dependent type 2 diabetes mellitus, uncontrolled     Uncontrolled, sugar typically 240.   Marland Kitchen Hypertension   . Chronic diastolic CHF (congestive heart failure)     a. 04/2009 Echo: EF 60-65%, nl wall motion, restrictive physiology & decreased diast compliance, Mild MR, mod dil  LA, PASP .;  b. 01/2011 Echo: Nl LV, Gr 2 DD, biatrial enlargement.  Marland Kitchen Psychosis   . Morbid obesity with BMI of 50.0-59.9, adult     Ht: 5\' 7"  Wt: 364 lbs. BMI: 57.0  . ANEMIA NOS   . ANXIETY DISORDER, GENERALIZED   . DEGENERATIVE JOINT DISEASE, KNEE   . DISORDER, BIPOLAR NOS   . HYPOTHYROIDISM NOS   . THYROTOXICOSIS   . OSA (obstructive sleep apnea) 03/06/2011  . Chest pain     a. 2012 Myoview: EF 63%, no isch/infarct    History reviewed. No pertinent past surgical history.  Family History  Problem Relation Age of Onset  . Heart failure Father   . Heart disease Maternal Grandfather   . Heart disease Paternal Grandfather   . Stroke Father     History  Substance Use Topics  . Smoking status: Never Smoker   . Smokeless tobacco: Never Used  . Alcohol Use: No    OB History   Grav Para Term Preterm Abortions TAB SAB Ect Mult Living                  Review of Systems  Constitutional: Negative for fever and chills.  Respiratory: Positive for cough and shortness of breath. Negative for wheezing.   Cardiovascular: Negative for chest pain, palpitations and leg  swelling.  Gastrointestinal: Negative for nausea, vomiting and abdominal pain.  Musculoskeletal: Negative for myalgias and back pain.  Skin: Negative for rash and wound.  Neurological: Negative for dizziness, weakness, light-headedness, numbness and headaches.  All other systems reviewed and are negative.    Allergies  Acetaminophen; Caffeine; and Lisinopril  Home Medications   Current Outpatient Rx  Name  Route  Sig  Dispense  Refill  . fosinopril (MONOPRIL) 40 MG tablet      TAKE 1 TABLET (40 MG TOTAL) BY MOUTH DAILY.   90 tablet   0   . furosemide (LASIX) 20 MG tablet   Oral   Take 60 mg by mouth daily.         . insulin aspart (NOVOLOG FLEXPEN) 100 UNIT/ML injection   Subcutaneous   Inject 15 Units into the skin 3 (three) times daily before meals. Don't take if you don't eat   3 pen   12   . insulin glargine (LANTUS SOLOSTAR) 100 UNIT/ML injection   Subcutaneous   Inject 55 Units into the skin at bedtime.   4 pen   12   . metFORMIN (GLUCOPHAGE) 500 MG tablet   Oral   Take 1,000 mg by mouth 2 (two) times daily with a meal.         . metFORMIN (  GLUCOPHAGE-XR) 500 MG 24 hr tablet   Oral   Take 2 tablets (1,000 mg total) by mouth 2 (two) times daily with a meal.   120 tablet   3   . potassium chloride (KLOR-CON) 10 MEQ CR tablet   Oral   Take 4 tablets (40 mEq total) by mouth daily.   120 tablet   3   . rosuvastatin (CRESTOR) 10 MG tablet   Oral   Take 1 tablet (10 mg total) by mouth daily.   90 tablet   3   . albuterol (PROVENTIL HFA;VENTOLIN HFA) 108 (90 BASE) MCG/ACT inhaler   Inhalation   Inhale 1-2 puffs into the lungs every 6 (six) hours as needed for wheezing.   1 Inhaler   0   . potassium chloride (K-DUR) 10 MEQ tablet   Oral   Take 4 tablets (40 mEq total) by mouth once.   120 tablet   0   . torsemide (DEMADEX) 20 MG tablet   Oral   Take 3 tablets (60 mg total) by mouth 2 (two) times daily.   90 tablet   3     BP 148/88   Pulse 80  Temp(Src) 98.5 F (36.9 C) (Oral)  Resp 25  Wt 374 lb 9 oz (169.9 kg)  BMI 58.65 kg/m2  SpO2 95%  Physical Exam  Nursing note and vitals reviewed. Constitutional: She is oriented to person, place, and time. She appears well-developed and well-nourished. No distress.  HENT:  Head: Normocephalic and atraumatic.  Mouth/Throat: Oropharynx is clear and moist.  Eyes: EOM are normal. Pupils are equal, round, and reactive to light.  Neck: Normal range of motion. Neck supple.  Cardiovascular: Normal rate and regular rhythm.   Pulmonary/Chest: No respiratory distress. She has no wheezes. She has no rales.  Increased resp effort. Decreased breath sound throughout. Large body habitus   Abdominal: Soft. Bowel sounds are normal.  Musculoskeletal: Normal range of motion. She exhibits no edema and no tenderness.  Neurological: She is alert and oriented to person, place, and time.  5/5 motor in all ext, sensation intact  Skin: Skin is warm and dry. No rash noted. No erythema.  Psychiatric: She has a normal mood and affect. Her behavior is normal.    ED Course  Procedures (including critical care time)  Labs Reviewed  COMPREHENSIVE METABOLIC PANEL - Abnormal; Notable for the following:    Glucose, Bld 196 (*)    Albumin 3.3 (*)    All other components within normal limits  PRO B NATRIURETIC PEPTIDE - Abnormal; Notable for the following:    Pro B Natriuretic peptide (BNP) 790.6 (*)    All other components within normal limits  CBC WITH DIFFERENTIAL  CBC  POCT I-STAT TROPONIN I   No results found.   1. Shortness of breath       MDM  Pt has been up to the bathroom several times to urinate. Staes she is ambulating well and DOE is improved. Will continue to observe, repeat ambulatory O2 sat and likely d/c to f/u with CHF clinic.         Loren Racer, MD 02/27/13 [phone]

## 2013-02-27 NOTE — ED Provider Notes (Signed)
Medical screening examination/treatment/procedure(s) were conducted as a shared visit with non-physician practitioner(s) and myself.  I personally evaluated the patient during the encounter   Loren Racer, MD 02/27/13 2329

## 2013-05-31 ENCOUNTER — Ambulatory Visit: Payer: Self-pay | Admitting: Cardiology

## 2013-06-01 ENCOUNTER — Ambulatory Visit: Payer: Self-pay | Attending: Internal Medicine

## 2013-06-01 VITALS — BP 137/65 | HR 90 | Temp 98.7°F | Resp 24 | Ht 67.0 in | Wt 373.0 lb

## 2013-06-01 DIAGNOSIS — I509 Heart failure, unspecified: Secondary | ICD-10-CM

## 2013-06-01 DIAGNOSIS — I5032 Chronic diastolic (congestive) heart failure: Secondary | ICD-10-CM

## 2013-06-01 DIAGNOSIS — R0789 Other chest pain: Secondary | ICD-10-CM

## 2013-06-01 DIAGNOSIS — E039 Hypothyroidism, unspecified: Secondary | ICD-10-CM

## 2013-06-01 DIAGNOSIS — I10 Essential (primary) hypertension: Secondary | ICD-10-CM

## 2013-06-01 DIAGNOSIS — E119 Type 2 diabetes mellitus without complications: Secondary | ICD-10-CM

## 2013-06-01 LAB — POCT GLYCOSYLATED HEMOGLOBIN (HGB A1C): Hemoglobin A1C: 8.8

## 2013-06-01 MED ORDER — ROSUVASTATIN CALCIUM 10 MG PO TABS: 10.0000 mg | ORAL_TABLET | Freq: Every day | ORAL | Status: DC

## 2013-06-01 MED ORDER — INSULIN GLARGINE 100 UNIT/ML ~~LOC~~ SOLN: 55.0000 [IU] | Freq: Every day | SUBCUTANEOUS | Status: DC

## 2013-06-01 MED ORDER — METFORMIN HCL ER 500 MG PO TB24: 1000.0000 mg | ORAL_TABLET | Freq: Two times a day (BID) | ORAL | Status: DC

## 2013-06-01 MED ORDER — FOSINOPRIL SODIUM 40 MG PO TABS: ORAL_TABLET | ORAL | Status: DC

## 2013-06-01 MED ORDER — ALBUTEROL SULFATE HFA 108 (90 BASE) MCG/ACT IN AERS: 1.0000 | INHALATION_SPRAY | Freq: Four times a day (QID) | RESPIRATORY_TRACT | Status: DC | PRN

## 2013-06-01 MED ORDER — POTASSIUM CHLORIDE CRYS ER 20 MEQ PO TBCR: 40.0000 meq | EXTENDED_RELEASE_TABLET | Freq: Every day | ORAL | Status: DC

## 2013-06-01 MED ORDER — FUROSEMIDE 20 MG PO TABS: 60.0000 mg | ORAL_TABLET | Freq: Every day | ORAL | Status: DC

## 2013-06-01 MED ORDER — INSULIN ASPART 100 UNIT/ML ~~LOC~~ SOLN: 15.0000 [IU] | Freq: Three times a day (TID) | SUBCUTANEOUS | Status: DC

## 2013-06-01 NOTE — Progress Notes (Unsigned)
Pt here with c/o SOB WITH EXERTION,WHEEZING AND LOWER EXTREM EDEMA X 2 DYS STOPPED TAKING LASIX X3 DYS NEED INSULIN REFILL AND FAXED TO [facility]

## 2013-06-01 NOTE — Progress Notes (Unsigned)
Patient ID: [name], female   DOB: 1966-02-02, 47 y.o.   MRN: [id number] Patient Demographics  [name], is a 47 y.o. female  WUJ:[id number]  [id number]  DOB - 05/27/1966  Chief Complaint  Patient presents with  . Medication Refill  . Shortness of Breath  . Foot Swelling        Subjective:   [name] today is here for a follow up visit. Patient has No headache, No chest pain, No abdominal pain - No Nausea, No new weakness tingling or numbness,  Patient is a 47 year old female with history of diastolic CHF, morbid obesity, insulin-dependent discitis, hypertension presents for followup. Patient reports that she was having shortness of breath with exertion wheezing with good extremity edema which has improved significantly with Lasix. She wants refill on the insulin. She feels short of breath and fatigued.  Objective:    Filed Vitals:   06/01/13 1424  BP: 137/65  Pulse: 90  Temp: 98.7 F (37.1 C)  TempSrc: Oral  Resp: 24  Height: 5\' 7"  (1.702 m)  Weight: 373 lb (169.192 kg)  SpO2: 92%     ALLERGIES:   Allergies  Allergen Reactions  . Acetaminophen     Fits as a child "seizures-like"  . Caffeine     Tense, anxiety, increased urination  . Lisinopril     Rash with lisinopril; but fosinopril is ok per patient    PAST MEDICAL HISTORY: Past Medical History  Diagnosis Date  . Insulin dependent type 2 diabetes mellitus, uncontrolled     Uncontrolled, sugar typically 240.   Marland Kitchen Hypertension   . Chronic diastolic CHF (congestive heart failure)     a. 04/2009 Echo: EF 60-65%, nl wall motion, restrictive physiology & decreased diast compliance, Mild MR, mod dil  LA, PASP .;  b. 01/2011 Echo: Nl LV, Gr 2 DD, biatrial enlargement.  Marland Kitchen Psychosis   . Morbid obesity with BMI of 50.0-59.9, adult     Ht: 5\' 7"  Wt: 364 lbs. BMI: 57.0  . ANEMIA NOS   . ANXIETY DISORDER, GENERALIZED   . DEGENERATIVE JOINT DISEASE, KNEE   . DISORDER, BIPOLAR NOS   .  HYPOTHYROIDISM NOS   . THYROTOXICOSIS   . OSA (obstructive sleep apnea) 03/06/2011  . Chest pain     a. 2012 Myoview: EF 63%, no isch/infarct    MEDICATIONS AT HOME: Prior to Admission medications   Medication Sig Start Date End Date Taking? Authorizing Provider  fosinopril (MONOPRIL) 40 MG tablet TAKE 1 TABLET (40 MG TOTAL) BY MOUTH DAILY. 06/01/13  Yes Nori Winegar Jenna Luo, MD  furosemide (LASIX) 20 MG tablet Take 3 tablets (60 mg total) by mouth daily. 06/01/13  Yes Gisela Lea Jenna Luo, MD  insulin aspart (NOVOLOG FLEXPEN) 100 UNIT/ML injection Inject 15 Units into the skin 3 (three) times daily before meals. Don't take if you don't eat 06/01/13  Yes Meilech Virts K Laird Runnion, MD  insulin glargine (LANTUS) 100 UNIT/ML injection Inject 0.55 mLs (55 Units total) into the skin at bedtime. 06/01/13  Yes Miche Loughridge Jenna Luo, MD  metFORMIN (GLUCOPHAGE) 500 MG tablet Take 1,000 mg by mouth 2 (two) times daily with a meal.   Yes Historical Provider, MD  potassium chloride (K-DUR) 10 MEQ tablet Take 4 tablets (40 mEq total) by mouth once. 02/23/13  Yes Arman Filter, NP  rosuvastatin (CRESTOR) 10 MG tablet Take 1 tablet (10 mg total) by mouth daily. 06/01/13  Yes Tommi Crepeau Jenna Luo, MD  albuterol (PROVENTIL HFA;VENTOLIN HFA) 108 (90 BASE)  MCG/ACT inhaler Inhale 1-2 puffs into the lungs every 6 (six) hours as needed for wheezing. 06/01/13   Nicolette Gieske Jenna Luo, MD  metFORMIN (GLUCOPHAGE-XR) 500 MG 24 hr tablet Take 2 tablets (1,000 mg total) by mouth 2 (two) times daily with a meal. 06/01/13   Manning Luna K Deosha Werden, MD  potassium chloride (KLOR-CON) 10 MEQ CR tablet Take 4 tablets (40 mEq total) by mouth daily. 08/22/12   Clanford Cyndie Mull, MD  potassium chloride SA (K-DUR,KLOR-CON) 20 MEQ tablet Take 2 tablets (40 mEq total) by mouth daily. 06/01/13   Diavian Furgason Jenna Luo, MD     Exam  General appearance :Awake, alert, NAD, Speech Clear.  HEENT: Atraumatic and Normocephalic, PERLA Neck: supple, no JVD. No cervical lymphadenopathy.  Chest: Clear to  auscultation bilaterally, no wheezing, rales or rhonchi CVS: S1 S2 regular, no murmurs.  Abdomen: Morbidly obese soft, NBS, NT, ND, no gaurding, rigidity or rebound. Extremities: no cyanosis or clubbing, B/L Lower Ext shows 1+ edema Neurology: Awake alert, and oriented X 3, CN II-XII intact, Non focal Skin: No Rash or lesions Wounds:N/A    Data Review   Basic Metabolic Panel: No results found for this basename: NA, K, CL, CO2, GLUCOSE, BUN, CREATININE, CALCIUM, MG, PHOS,  in the last 168 hours Liver Function Tests: No results found for this basename: AST, ALT, ALKPHOS, BILITOT, PROT, ALBUMIN,  in the last 168 hours  CBC: No results found for this basename: WBC, NEUTROABS, HGB, HCT, MCV, PLT,  in the last 168 hours  ------------------------------------------------------------------------------------------------------------------ No results found for this basename: HGBA1C,  in the last 72 hours ------------------------------------------------------------------------------------------------------------------ No results found for this basename: CHOL, HDL, LDLCALC, TRIG, CHOLHDL, LDLDIRECT,  in the last 72 hours ------------------------------------------------------------------------------------------------------------------ No results found for this basename: TSH, T4TOTAL, FREET3, T3FREE, THYROIDAB,  in the last 72 hours ------------------------------------------------------------------------------------------------------------------ No results found for this basename: VITAMINB12, FOLATE, FERRITIN, TIBC, IRON, RETICCTPCT,  in the last 72 hours  Coagulation profile  No results found for this basename: INR, PROTIME,  in the last 168 hours    Assessment & Plan   Active Problems: History of diastolic CHF: Followed by CHF clinic, Dr. Gala Romney - Still feels fatigued and chronic dyspnea, placed refer to Dr. Gala Romney, CHF clinic for an earlier appointment - Refilled all her medications  including Lasix, potassium supplement, lisinopril - Patient had a myocardial perfusion scan on 10/31/12 which showed normal stress yesterday with small mild fixed and inferoseptal defect likely soft tissue attenuation but no ischemia, EF 71% normal wall motion   Diabetes mellitus - Will refill Lantus, NovoLog insulin, metformin  Morbid obesity: Counseled strongly on losing weight and exercise, diet control  Recommendations: referral to CHF clinic, followup CBC, BMET in next visit  Follow-up in 3 months     Saleh Ulbrich M.D. 06/01/2013, 2:34 PM

## 2013-06-02 LAB — BASIC METABOLIC PANEL
BUN: 8 mg/dL (ref 6–23)
Calcium: 8.6 mg/dL (ref 8.4–10.5)
Creat: 0.59 mg/dL (ref 0.50–1.10)
Glucose, Bld: 153 mg/dL — ABNORMAL HIGH (ref 70–99)

## 2013-07-10 ENCOUNTER — Ambulatory Visit (INDEPENDENT_AMBULATORY_CARE_PROVIDER_SITE_OTHER): Payer: No Typology Code available for payment source | Admitting: Cardiology

## 2013-07-10 ENCOUNTER — Encounter: Payer: Self-pay | Admitting: Cardiology

## 2013-07-10 VITALS — BP 152/108 | HR 79 | Ht 67.0 in | Wt 370.0 lb

## 2013-07-10 DIAGNOSIS — R0789 Other chest pain: Secondary | ICD-10-CM

## 2013-07-10 DIAGNOSIS — I5032 Chronic diastolic (congestive) heart failure: Secondary | ICD-10-CM

## 2013-07-10 DIAGNOSIS — I10 Essential (primary) hypertension: Secondary | ICD-10-CM

## 2013-07-10 DIAGNOSIS — I509 Heart failure, unspecified: Secondary | ICD-10-CM

## 2013-07-10 NOTE — Assessment & Plan Note (Signed)
We discussed weight loss. 

## 2013-07-10 NOTE — Patient Instructions (Signed)
Your physician recommends that you schedule a follow-up appointment in: 4 WEEKS WITH AN EXTENDER  Your physician wants you to follow-up in: 3 MONTHS WITH DR Jens Som You will receive a reminder letter in the mail two months in advance. If you don't receive a letter, please call our office to schedule the follow-up appointment.    FUROSEMIDE 20 MG TAKE 3 TABLETS TODAY, TAKE 3 TABLETS TOMORROW MORNING AND 3 TABLETS TOMORROW AFTERNOON, THEN 3 TABLETS ONCE DAILY  Your physician recommends that you return for lab work in: ONE WEEK

## 2013-07-10 NOTE — Assessment & Plan Note (Signed)
I recommended a cardiac catheterization today. She will consider this but is not agreeable at present.

## 2013-07-10 NOTE — Assessment & Plan Note (Signed)
Blood pressure is elevated but she has not taken her medications. I have stressed compliance and we will resume all previous blood pressure medications. She will return in 4 weeks to see one of our assistants and she will see me back in 3 months.

## 2013-07-10 NOTE — Assessment & Plan Note (Signed)
Patient is complaining of worsening orthopnea and dyspnea on exertion. However she is not taking her Lasix routinely. She's also not taken her blood pressure medications. I have asked her to take Lasix 60 mg today, 60 mg by mouth twice a day tomorrow and then 60 mg daily thereafter. Check potassium and renal function in one week. I explained the importance of compliance. She needs low sodium diet as well.

## 2013-07-10 NOTE — Progress Notes (Signed)
HPI: FU chronic diastolic heart failure, morbid obesity with a BMI greater than 40. Previously followed by Dr. Gala Romney. Echo 5/12: Left ventricle: The cavity size was normal. Wall thickness was increased in a pattern of mild LVH. Systolic function was normal. The estimated ejection fraction was in the range of 60% to 65%. Possible basal inferior hypokinesis. Features are consistent with a pseudonormal left ventricular filling pattern, with concomitant abnormal relaxation and increased filling pressure (grade 2 diastolic dysfunction). Nuclear study February 2014 showed an ejection fraction of 71%. There is soft tissue attenuation but no ischemia. Last seen January 2014. Since then, she has significant dyspnea on exertion, orthopnea; she has chest tightness particularly when she does not take her Lasix. She is not taking her blood pressure medications every day. She does not take her Lasix every day either.   Current Outpatient Prescriptions  Medication Sig Dispense Refill  . albuterol (PROVENTIL HFA;VENTOLIN HFA) 108 (90 BASE) MCG/ACT inhaler Inhale 1-2 puffs into the lungs every 6 (six) hours as needed for wheezing.  1 Inhaler  3  . fosinopril (MONOPRIL) 40 MG tablet TAKE 1 TABLET (40 MG TOTAL) BY MOUTH DAILY.  90 tablet  3  . furosemide (LASIX) 20 MG tablet Take 3 tablets (60 mg total) by mouth daily.  90 tablet  3  . insulin aspart (NOVOLOG FLEXPEN) 100 UNIT/ML injection Inject 15 Units into the skin 3 (three) times daily before meals. Don't take if you don't eat  3 pen  12  . insulin glargine (LANTUS) 100 UNIT/ML injection Inject 0.55 mLs (55 Units total) into the skin at bedtime.  4 pen  12  . metFORMIN (GLUCOPHAGE) 500 MG tablet Take 1,000 mg by mouth 2 (two) times daily with a meal.      . metFORMIN (GLUCOPHAGE-XR) 500 MG 24 hr tablet Take 2 tablets (1,000 mg total) by mouth 2 (two) times daily with a meal.  120 tablet  3  . potassium chloride (K-DUR) 10 MEQ tablet Take 4 tablets (40  mEq total) by mouth once.  120 tablet  0  . potassium chloride (KLOR-CON) 10 MEQ CR tablet Take 4 tablets (40 mEq total) by mouth daily.  120 tablet  3  . potassium chloride SA (K-DUR,KLOR-CON) 20 MEQ tablet Take 2 tablets (40 mEq total) by mouth daily.  60 tablet  3  . rosuvastatin (CRESTOR) 10 MG tablet Take 1 tablet (10 mg total) by mouth daily.  90 tablet  3   No current facility-administered medications for this visit.     Past Medical History  Diagnosis Date  . Insulin dependent type 2 diabetes mellitus, uncontrolled     Uncontrolled, sugar typically 240.   Marland Kitchen Hypertension   . Chronic diastolic CHF (congestive heart failure)     a. 04/2009 Echo: EF 60-65%, nl wall motion, restrictive physiology & decreased diast compliance, Mild MR, mod dil  LA, PASP .;  b. 01/2011 Echo: Nl LV, Gr 2 DD, biatrial enlargement.  Marland Kitchen Psychosis   . Morbid obesity with BMI of 50.0-59.9, adult     Ht: 5\' 7"  Wt: 364 lbs. BMI: 57.0  . ANEMIA NOS   . ANXIETY DISORDER, GENERALIZED   . DEGENERATIVE JOINT DISEASE, KNEE   . DISORDER, BIPOLAR NOS   . HYPOTHYROIDISM NOS   . THYROTOXICOSIS   . OSA (obstructive sleep apnea) 03/06/2011  . Chest pain     a. 2012 Myoview: EF 63%, no isch/infarct    History reviewed. No pertinent past  surgical history.  History   Social History  . Marital Status: Single    Spouse Name: N/A    Number of Children: 0  . Years of Education: 89   Occupational History  . unemployed    Social History Main Topics  . Smoking status: Never Smoker   . Smokeless tobacco: Never Used  . Alcohol Use: No  . Drug Use: No  . Sexual Activity: Not Currently   Other Topics Concern  . 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 But ran out of money and is no longer attending school. (Note patient has bipolar disorder).      Born in [place] but lived in [place] before coming  back to [place].       Primary language is Arabic. Lives with mother and brother.                ROS: no fevers or chills, productive cough, hemoptysis, dysphasia, odynophagia, melena, hematochezia, dysuria, hematuria, rash, seizure activity, orthopnea, PND, pedal edema, claudication. Remaining systems are negative.  Physical Exam: Well-developed morbidly obese in no acute distress.  Skin is warm and dry.  HEENT is normal.  Neck is supple.  Chest is clear to auscultation with normal expansion.  Cardiovascular exam is regular rate and rhythm.  Abdominal exam nontender or distended. No masses palpated. Extremities show no edema. neuro grossly intact  ECG sinus rhythm at a rate of 79. Cannot rule out prior septal infarct.

## 2013-07-17 ENCOUNTER — Other Ambulatory Visit (INDEPENDENT_AMBULATORY_CARE_PROVIDER_SITE_OTHER): Payer: No Typology Code available for payment source

## 2013-07-17 DIAGNOSIS — R0789 Other chest pain: Secondary | ICD-10-CM

## 2013-07-17 LAB — BASIC METABOLIC PANEL
Calcium: 8.6 mg/dL (ref 8.4–10.5)
Creatinine, Ser: 0.6 mg/dL (ref 0.4–1.2)

## 2013-08-07 ENCOUNTER — Ambulatory Visit (INDEPENDENT_AMBULATORY_CARE_PROVIDER_SITE_OTHER): Payer: No Typology Code available for payment source | Admitting: Nurse Practitioner

## 2013-08-07 ENCOUNTER — Encounter: Payer: Self-pay | Admitting: Nurse Practitioner

## 2013-08-07 VITALS — BP 130/90 | HR 84 | Ht 67.0 in | Wt 361.8 lb

## 2013-08-07 DIAGNOSIS — I5032 Chronic diastolic (congestive) heart failure: Secondary | ICD-10-CM

## 2013-08-07 LAB — BASIC METABOLIC PANEL
BUN: 13 mg/dL (ref 6–23)
CO2: 31 mEq/L (ref 19–32)
Calcium: 9.7 mg/dL (ref 8.4–10.5)
Chloride: 96 mEq/L (ref 96–112)
Creatinine, Ser: 0.6 mg/dL (ref 0.4–1.2)
GFR: 144.28 mL/min (ref >60.00)
Glucose, Bld: 227 mg/dL — ABNORMAL HIGH (ref 70–99)
Potassium: 4 mEq/L (ref 3.5–5.1)
Sodium: 137 mEq/L (ref 135–145)

## 2013-08-07 NOTE — Patient Instructions (Signed)
Cut back on your intake of water  We will get an ultrasound of your heart  Stay on your current medicines - take them every day  We need to check labs today  Avoid salt  See me and Dr. Jens Som in a month  Call the [facility] office at [phone] if you have any questions, problems or concerns.

## 2013-08-07 NOTE — Progress Notes (Signed)
[name] Date of Birth: 09/22/1966 Medical Record #[id number]  History of Present Illness: [name] is seen back today for a one month check. Seen for Dr. Jens Som. She is a 47 year old with chronic diastolic HF, morbid obesity, HTN, IDDM, OSA, DJD, bipolar disorder with psychosis, and OSA. Had a negative Myoview back in February of 2014.  Last echo looks to be from May of 2012 with normal EF.  Seen last month - was more short of breath and having chest pain - was not taking her medicines as prescribed - Dr. Jens Som recommended cath but she wanted medical therapy - medicines were adjusted.   Comes back today. Here with her mom. She says she is better than she was a month ago. Weight is down almost 10 pounds. No more chest pain but will have some tightness. Still with fatigue and considerable dyspnea. Using too much salt. Sugars are high. Does not smoke. Can't wear her CPAP mask - to claustrophobic. Still not interested in having a heart cath. Swelling has resolved. Mom says she uses too much salt, but sleeping better and "just needs to lose weight".    Current Outpatient Prescriptions  Medication Sig Dispense Refill  . albuterol (PROVENTIL HFA;VENTOLIN HFA) 108 (90 BASE) MCG/ACT inhaler Inhale 1-2 puffs into the lungs every 6 (six) hours as needed for wheezing.  1 Inhaler  3  . fosinopril (MONOPRIL) 40 MG tablet TAKE 1 TABLET (40 MG TOTAL) BY MOUTH DAILY.  90 tablet  3  . furosemide (LASIX) 20 MG tablet Take 3 tablets (60 mg total) by mouth daily.  90 tablet  3  . insulin aspart (NOVOLOG FLEXPEN) 100 UNIT/ML injection Inject 15 Units into the skin 3 (three) times daily before meals. Don't take if you don't eat  3 pen  12  . insulin glargine (LANTUS) 100 UNIT/ML injection Inject 0.55 mLs (55 Units total) into the skin at bedtime.  4 pen  12  . metFORMIN (GLUCOPHAGE-XR) 500 MG 24 hr tablet Take 2 tablets (1,000 mg total) by mouth 2 (two) times daily with a meal.  120 tablet  3  .  potassium chloride (KLOR-CON) 10 MEQ CR tablet Take 4 tablets (40 mEq total) by mouth daily.  120 tablet  3  . rosuvastatin (CRESTOR) 10 MG tablet Take 1 tablet (10 mg total) by mouth daily.  90 tablet  3   No current facility-administered medications for this visit.    Allergies  Allergen Reactions  . Acetaminophen     Fits as a child "seizures-like"  . Caffeine     Tense, anxiety, increased urination  . Lisinopril     Rash with lisinopril; but fosinopril is ok per patient    Past Medical History  Diagnosis Date  . Insulin dependent type 2 diabetes mellitus, uncontrolled     Uncontrolled, sugar typically 240.   Marland Kitchen Hypertension   . Chronic diastolic CHF (congestive heart failure)     a. 04/2009 Echo: EF 60-65%, nl wall motion, restrictive physiology & decreased diast compliance, Mild MR, mod dil  LA, PASP .;  b. 01/2011 Echo: Nl LV, Gr 2 DD, biatrial enlargement.  Marland Kitchen Psychosis   . Morbid obesity with BMI of 50.0-59.9, adult     Ht: 5\' 7"  Wt: 364 lbs. BMI: 57.0  . ANEMIA NOS   . ANXIETY DISORDER, GENERALIZED   . DEGENERATIVE JOINT DISEASE, KNEE   . DISORDER, BIPOLAR NOS   . HYPOTHYROIDISM NOS   . THYROTOXICOSIS   .  OSA (obstructive sleep apnea) 03/06/2011  . Chest pain     a. 2012 Myoview: EF 63%, no isch/infarct    No past surgical history on file.  History  Smoking status  . Never Smoker   Smokeless tobacco  . Never Used    History  Alcohol Use No    Family History  Problem Relation Age of Onset  . Heart failure Father   . Heart disease Maternal Grandfather   . Heart disease Paternal Grandfather   . Stroke Father     Review of Systems: The review of systems is per the HPI.  All other systems were reviewed and are negative.  Physical Exam: BP 130/90  Pulse 84  Ht 5\' 7"  (1.702 m)  Wt 361 lb 12.8 oz (164.111 kg)  BMI 56.65 kg/m2 Patient is very pleasant and in no acute distress. From [place]. She is morbidly obese. She is short of breath at rest - oxygen  sat is 93% on RA. Skin is warm and dry. Color is normal.  HEENT is unremarkable. Normocephalic/atraumatic. PERRL. Sclera are nonicteric. Neck is supple. No masses. No JVD. Lungs are clear. Cardiac exam shows a regular rate and rhythm. Heart tones distant. Abdomen is obese but soft. Extremities are without edema. Gait and ROM are intact. No gross neurologic deficits noted.  Wt Readings from Last 3 Encounters:  08/07/13 361 lb 12.8 oz (164.111 kg)  07/10/13 370 lb (167.831 kg)  06/01/13 373 lb (169.192 kg)    LABORATORY DATA: PENDING  Lab Results  Component Value Date   WBC 7.7 02/23/2013   HGB 12.5 02/23/2013   HCT 38.2 02/23/2013   PLT 212 02/23/2013   GLUCOSE 229* 07/17/2013   CHOL 153 07/18/2011   TRIG 269* 07/18/2011   HDL 44 07/18/2011   LDLCALC 55 07/18/2011   ALT 13 02/23/2013   AST 26 02/23/2013   NA 136 07/17/2013   K 4.1 07/17/2013   CL 98 07/17/2013   CREATININE 0.6 07/17/2013   BUN 11 07/17/2013   CO2 31 07/17/2013   TSH 4.214 08/17/2012   HGBA1C 8.8 06/01/2013   MICROALBUR 2.53* 04/16/2010   Myoview from February 2014 Overall Impression: Normal stress nuclear study with a small, mild, fixed inferoseptal defect suggestive of soft tissue attenuation but no ischemia.  LV Ejection Fraction: 71%. LV Wall Motion: NL LV Function; NL Wall Motion  Olga Millers   Assessment / Plan: 1. Chronic diastolic HF - will update her echo - she remains short of breath. Not interested in cardiac cath despite her other symptoms - not sure we have much to offer. Encouraged her to cut back the salt, take her medicines. Will see her back in a month on a day that Dr. Jens Som is here.   2. HTN - BP has improved. She is on ACE - may need to consider hydralazine/nitrates  3. Morbid obesity - this would be the crux of her issues. Overall prognosis looks quite poor to me.   Patient is agreeable to this plan and will call if any problems develop in the interim.   Rosalio Macadamia, RN, ANP-C [facility]  [facility] [address] [place], [place]  [address]

## 2013-08-09 LAB — BRAIN NATRIURETIC PEPTIDE: Pro B Natriuretic peptide (BNP): 488 pg/mL — ABNORMAL HIGH (ref 0.0–100.0)

## 2013-08-21 ENCOUNTER — Ambulatory Visit (HOSPITAL_COMMUNITY): Payer: No Typology Code available for payment source | Attending: Internal Medicine | Admitting: Radiology

## 2013-08-21 ENCOUNTER — Encounter: Payer: Self-pay | Admitting: Internal Medicine

## 2013-08-21 DIAGNOSIS — E039 Hypothyroidism, unspecified: Secondary | ICD-10-CM | POA: Insufficient documentation

## 2013-08-21 DIAGNOSIS — I5032 Chronic diastolic (congestive) heart failure: Secondary | ICD-10-CM

## 2013-08-21 DIAGNOSIS — I079 Rheumatic tricuspid valve disease, unspecified: Secondary | ICD-10-CM | POA: Insufficient documentation

## 2013-08-21 DIAGNOSIS — M1990 Unspecified osteoarthritis, unspecified site: Secondary | ICD-10-CM | POA: Insufficient documentation

## 2013-08-21 DIAGNOSIS — I10 Essential (primary) hypertension: Secondary | ICD-10-CM | POA: Insufficient documentation

## 2013-08-21 DIAGNOSIS — I509 Heart failure, unspecified: Secondary | ICD-10-CM | POA: Insufficient documentation

## 2013-08-21 DIAGNOSIS — G4733 Obstructive sleep apnea (adult) (pediatric): Secondary | ICD-10-CM | POA: Insufficient documentation

## 2013-08-21 DIAGNOSIS — Z6841 Body Mass Index (BMI) 40.0 and over, adult: Secondary | ICD-10-CM | POA: Insufficient documentation

## 2013-08-21 DIAGNOSIS — R072 Precordial pain: Secondary | ICD-10-CM

## 2013-08-21 DIAGNOSIS — R079 Chest pain, unspecified: Secondary | ICD-10-CM | POA: Insufficient documentation

## 2013-08-21 NOTE — Progress Notes (Signed)
Echocardiogram performed.  

## 2013-08-30 ENCOUNTER — Emergency Department (INDEPENDENT_AMBULATORY_CARE_PROVIDER_SITE_OTHER): Payer: No Typology Code available for payment source

## 2013-08-30 ENCOUNTER — Emergency Department (INDEPENDENT_AMBULATORY_CARE_PROVIDER_SITE_OTHER)
Admission: EM | Admit: 2013-08-30 | Discharge: 2013-08-30 | Disposition: A | Discharge status: Home / Self Care | Payer: No Typology Code available for payment source | Source: Home / Self Care | Attending: Family Medicine | Admitting: Family Medicine

## 2013-08-30 ENCOUNTER — Encounter (HOSPITAL_COMMUNITY): Payer: Self-pay | Admitting: Emergency Medicine

## 2013-08-30 ENCOUNTER — Emergency Department (HOSPITAL_COMMUNITY)
Admission: EM | Admit: 2013-08-30 | Discharge: 2013-08-30 | Disposition: A | Discharge status: Home / Self Care | Payer: No Typology Code available for payment source | Attending: Emergency Medicine | Admitting: Emergency Medicine

## 2013-08-30 DIAGNOSIS — Z8659 Personal history of other mental and behavioral disorders: Secondary | ICD-10-CM | POA: Insufficient documentation

## 2013-08-30 DIAGNOSIS — Z6841 Body Mass Index (BMI) 40.0 and over, adult: Secondary | ICD-10-CM | POA: Insufficient documentation

## 2013-08-30 DIAGNOSIS — Z794 Long term (current) use of insulin: Secondary | ICD-10-CM | POA: Insufficient documentation

## 2013-08-30 DIAGNOSIS — I10 Essential (primary) hypertension: Secondary | ICD-10-CM | POA: Insufficient documentation

## 2013-08-30 DIAGNOSIS — J40 Bronchitis, not specified as acute or chronic: Secondary | ICD-10-CM

## 2013-08-30 DIAGNOSIS — IMO0001 Reserved for inherently not codable concepts without codable children: Secondary | ICD-10-CM | POA: Insufficient documentation

## 2013-08-30 DIAGNOSIS — I5032 Chronic diastolic (congestive) heart failure: Secondary | ICD-10-CM | POA: Insufficient documentation

## 2013-08-30 DIAGNOSIS — Z8669 Personal history of other diseases of the nervous system and sense organs: Secondary | ICD-10-CM | POA: Insufficient documentation

## 2013-08-30 DIAGNOSIS — Z8739 Personal history of other diseases of the musculoskeletal system and connective tissue: Secondary | ICD-10-CM | POA: Insufficient documentation

## 2013-08-30 DIAGNOSIS — Z79899 Other long term (current) drug therapy: Secondary | ICD-10-CM | POA: Insufficient documentation

## 2013-08-30 DIAGNOSIS — I509 Heart failure, unspecified: Secondary | ICD-10-CM

## 2013-08-30 DIAGNOSIS — J209 Acute bronchitis, unspecified: Secondary | ICD-10-CM | POA: Insufficient documentation

## 2013-08-30 DIAGNOSIS — Z862 Personal history of diseases of the blood and blood-forming organs and certain disorders involving the immune mechanism: Secondary | ICD-10-CM | POA: Insufficient documentation

## 2013-08-30 DIAGNOSIS — R509 Fever, unspecified: Secondary | ICD-10-CM | POA: Insufficient documentation

## 2013-08-30 LAB — COMPREHENSIVE METABOLIC PANEL
ALT: 8 U/L (ref 0–35)
Albumin: 3 g/dL — ABNORMAL LOW (ref 3.5–5.2)
Alkaline Phosphatase: 103 U/L (ref 39–117)
BUN: 9 mg/dL (ref 6–23)
Chloride: 100 mEq/L (ref 96–112)
Potassium: 4.4 mEq/L (ref 3.5–5.1)
Sodium: 136 mEq/L (ref 135–145)
Total Bilirubin: 0.4 mg/dL (ref 0.3–1.2)

## 2013-08-30 LAB — CBC WITH DIFFERENTIAL/PLATELET
Basophils Relative: 0 % (ref 0–1)
Hemoglobin: 12.3 g/dL (ref 12.0–15.0)
Lymphs Abs: 1.7 10*3/uL (ref 0.7–4.0)
MCHC: 32.5 g/dL (ref 30.0–36.0)
Monocytes Relative: 7 % (ref 3–12)
Neutro Abs: 4.2 10*3/uL (ref 1.7–7.7)
Neutrophils Relative %: 66 % (ref 43–77)
Platelets: 224 10*3/uL (ref 150–400)
RBC: 4.44 MIL/uL (ref 3.87–5.11)

## 2013-08-30 MED ORDER — HALOPERIDOL LACTATE 5 MG/ML IJ SOLN
5.0000 mg | Freq: Once | INTRAMUSCULAR | Status: AC
  Administered 2013-08-30: 5 mg via INTRAVENOUS
  Filled 2013-08-30: qty 1

## 2013-08-30 MED ORDER — ALBUTEROL SULFATE (5 MG/ML) 0.5% IN NEBU
2.5000 mg | INHALATION_SOLUTION | Freq: Once | RESPIRATORY_TRACT | Status: AC
  Administered 2013-08-30: 2.5 mg via RESPIRATORY_TRACT
  Filled 2013-08-30: qty 0.5

## 2013-08-30 MED ORDER — ALBUTEROL SULFATE (5 MG/ML) 0.5% IN NEBU
INHALATION_SOLUTION | RESPIRATORY_TRACT | Status: AC
  Filled 2013-08-30: qty 1

## 2013-08-30 MED ORDER — GUAIFENESIN-CODEINE 100-10 MG/5ML PO SOLN: 10.0000 mL | Freq: Three times a day (TID) | ORAL | Status: DC | PRN

## 2013-08-30 MED ORDER — FUROSEMIDE 10 MG/ML IJ SOLN
40.0000 mg | Freq: Once | INTRAMUSCULAR | Status: AC
  Administered 2013-08-30: 40 mg via INTRAVENOUS

## 2013-08-30 MED ORDER — SODIUM CHLORIDE 0.9 % IN NEBU
INHALATION_SOLUTION | RESPIRATORY_TRACT | Status: AC
  Filled 2013-08-30: qty 3

## 2013-08-30 MED ORDER — IPRATROPIUM BROMIDE 0.02 % IN SOLN
0.5000 mg | Freq: Once | RESPIRATORY_TRACT | Status: AC
  Administered 2013-08-30: 0.5 mg via RESPIRATORY_TRACT

## 2013-08-30 MED ORDER — SODIUM CHLORIDE 0.9 % IV SOLN: Freq: Once | INTRAVENOUS | Status: DC

## 2013-08-30 MED ORDER — IPRATROPIUM BROMIDE 0.02 % IN SOLN
RESPIRATORY_TRACT | Status: AC
  Filled 2013-08-30: qty 2.5

## 2013-08-30 MED ORDER — AZITHROMYCIN 250 MG PO TABS: ORAL_TABLET | ORAL | Status: DC

## 2013-08-30 MED ORDER — BENZONATATE 100 MG PO CAPS
100.0000 mg | ORAL_CAPSULE | Freq: Once | ORAL | Status: AC
  Administered 2013-08-30: 100 mg via ORAL
  Filled 2013-08-30: qty 1

## 2013-08-30 MED ORDER — METHYLPREDNISOLONE SODIUM SUCC 125 MG IJ SOLR
125.0000 mg | Freq: Once | INTRAMUSCULAR | Status: DC
  Filled 2013-08-30: qty 2

## 2013-08-30 MED ORDER — ALBUTEROL SULFATE (5 MG/ML) 0.5% IN NEBU
5.0000 mg | INHALATION_SOLUTION | Freq: Once | RESPIRATORY_TRACT | Status: AC
  Administered 2013-08-30: 5 mg via RESPIRATORY_TRACT

## 2013-08-30 MED ORDER — ALBUTEROL SULFATE HFA 108 (90 BASE) MCG/ACT IN AERS: 1.0000 | INHALATION_SPRAY | Freq: Four times a day (QID) | RESPIRATORY_TRACT | Status: DC | PRN

## 2013-08-30 MED ORDER — IPRATROPIUM BROMIDE 0.02 % IN SOLN
0.5000 mg | Freq: Once | RESPIRATORY_TRACT | Status: AC
  Administered 2013-08-30: 0.5 mg via RESPIRATORY_TRACT
  Filled 2013-08-30: qty 2.5

## 2013-08-30 NOTE — ED Notes (Signed)
C/o SOB onset 0400.  C/o productive cough x 3-4 days.   Sputum is thick and yellowish green.  C/o fever onset 3 days ago 40 C).  O2 sat 91 % on arrival to room. Pt. put on O2  at 2 L and it came up to 96 %.  C/o muscle aches, and headache.  Hx. CHF and is treated by Dr. Jens Som.

## 2013-08-30 NOTE — ED Notes (Signed)
Pt. Refused transport by EMS.  Pt. being transferred on O2.with EMT.

## 2013-08-30 NOTE — ED Notes (Signed)
Sob increasing after URI for 4 days w/ tjhick sputum went to ucc had low sats and was given breathing tx and xray

## 2013-08-30 NOTE — ED Provider Notes (Signed)
[name] is a 47 y.o. female who presents to Urgent Care today for 4 days of nonstop coughing associated with some posttussive emesis she and fever. Patient became short of breath this morning. She notes some slight chest tightness but denies any central exertional chest pain. She denies any nausea vomiting or diarrhea fevers or chills. She has tried some over-the-counter medications which are effective. She denies any leg swelling or orthopnea.   Past Medical History  Diagnosis Date  . Insulin dependent type 2 diabetes mellitus, uncontrolled     Uncontrolled, sugar typically 240.   Marland Kitchen Hypertension   . Chronic diastolic CHF (congestive heart failure)     a. 04/2009 Echo: EF 60-65%, nl wall motion, restrictive physiology & decreased diast compliance, Mild MR, mod dil  LA, PASP .;  b. 01/2011 Echo: Nl LV, Gr 2 DD, biatrial enlargement.  Marland Kitchen Psychosis   . Morbid obesity with BMI of 50.0-59.9, adult     Ht: 5\' 7"  Wt: 364 lbs. BMI: 57.0  . ANEMIA NOS   . ANXIETY DISORDER, GENERALIZED   . DEGENERATIVE JOINT DISEASE, KNEE   . DISORDER, BIPOLAR NOS   . HYPOTHYROIDISM NOS   . THYROTOXICOSIS   . OSA (obstructive sleep apnea) 03/06/2011  . Chest pain     a. 2012 Myoview: EF 63%, no isch/infarct   History  Substance Use Topics  . Smoking status: Never Smoker   . Smokeless tobacco: Never Used  . Alcohol Use: No   ROS as above Medications reviewed. No current facility-administered medications for this encounter.   Current Outpatient Prescriptions  Medication Sig Dispense Refill  . fosinopril (MONOPRIL) 40 MG tablet TAKE 1 TABLET (40 MG TOTAL) BY MOUTH DAILY.  90 tablet  3  . furosemide (LASIX) 20 MG tablet Take 3 tablets (60 mg total) by mouth daily.  90 tablet  3  . insulin aspart (NOVOLOG FLEXPEN) 100 UNIT/ML injection Inject 15 Units into the skin 3 (three) times daily before meals. Don't take if you don't eat  3 pen  12  . insulin glargine (LANTUS) 100 UNIT/ML injection Inject 0.55  mLs (55 Units total) into the skin at bedtime.  4 pen  12  . metFORMIN (GLUCOPHAGE-XR) 500 MG 24 hr tablet Take 2 tablets (1,000 mg total) by mouth 2 (two) times daily with a meal.  120 tablet  3  . potassium chloride (KLOR-CON) 10 MEQ CR tablet Take 4 tablets (40 mEq total) by mouth daily.  120 tablet  3  . rosuvastatin (CRESTOR) 10 MG tablet Take 1 tablet (10 mg total) by mouth daily.  90 tablet  3  . albuterol (PROVENTIL HFA;VENTOLIN HFA) 108 (90 BASE) MCG/ACT inhaler Inhale 1-2 puffs into the lungs every 6 (six) hours as needed for wheezing.  1 Inhaler  3    Exam:  BP 167/74  Pulse 85  Temp(Src) 98.8 F (37.1 C) (Oral)  Resp 25  SpO2 96%  LMP 08/09/2013 oxygen saturation 89-91% on room air and up to 96% on 2 L of nasal cannula oxygen Gen: Well NAD HEENT: EOMI,  MMM Lungs: Increased work of breathing with rhonchorous breath sounds bilaterally. Crackles bilateral bases.  Heart: RRR no MRG Abd: NABS, Soft. NT, ND Exts: Non edematous BL  LE, warm and well perfused.   A she was given a DuoNeb nebulizer treatment and had mild improvement in symptoms.  No results found for this or any previous visit (from the past 24 hour(s)). Dg Chest 2 View  08/30/2013  CLINICAL DATA:  Shortness of breath, fever.  EXAM: CHEST  2 VIEW  COMPARISON:  January 24, 2013.  FINDINGS: Stable mild cardiomegaly. No pleural effusion or pneumothorax is noted. Stable central pulmonary vascular congestion and diffuse interstitial prominence is noted. Bony thorax is intact.  IMPRESSION: Stable cardiomegaly and central pulmonary vascular congestion as well as bilateral interstitial prominence compared to prior exam.   Electronically Signed   By: Roque Lias M.D.   On: 08/30/2013 14:13    Assessment and Plan: 47 y.o. female with shortness of breath with probable CHF exacerbation. This may be complicated by bronchitis.  Plan to transfer to the emergency room. I would prefer shuttle as patient is using oxygen currently  however she refuses. I feel a very short trip in the shuttle is reasonable. Discussed warning signs or symptoms. Please see discharge instructions. Patient expresses understanding.      Rodolph Bong, MD 08/30/13 1440

## 2013-08-30 NOTE — ED Provider Notes (Signed)
CSN: [id number]     Arrival date & time 08/30/13  1501 History   First MD Initiated Contact with Patient 08/30/13 1512     Chief Complaint  Patient presents with  . Shortness of Breath   (Consider location/radiation/quality/duration/timing/severity/associated sxs/prior Treatment) Patient is a 47 y.o. female presenting with shortness of breath. The history is provided by the patient. No language interpreter was used.  Shortness of Breath Severity:  Mild Onset quality:  Gradual Timing:  Intermittent Progression:  Waxing and waning Context: URI   Context: not activity   Associated symptoms: cough, fever and wheezing   Associated symptoms: no abdominal pain, no chest pain, no neck pain, no rash and no vomiting   Cough:    Cough characteristics:  Productive   Sputum characteristics:  Yellow and green   Severity:  Moderate   Duration:  4 days Risk factors: obesity   pt is a 47 year old female who presents after a visit to urgent care earlier today. She reports that she has had a loose, wet cough for approx 4 days that is associated with fever and increasing shortness of breath. She reports that she has been coughing almost non-stop and feels like her chest is very irritated. She denies any nausea or vomiting. She reports that she has been taking her lasix and b/p meds regularly but did miss today's dose. She reports gradual worsening of her symptoms today and has been wheezing. She denies any orthopnea or leg swelling.    Past Medical History  Diagnosis Date  . Insulin dependent type 2 diabetes mellitus, uncontrolled     Uncontrolled, sugar typically 240.   Marland Kitchen Hypertension   . Chronic diastolic CHF (congestive heart failure)     a. 04/2009 Echo: EF 60-65%, nl wall motion, restrictive physiology & decreased diast compliance, Mild MR, mod dil  LA, PASP .;  b. 01/2011 Echo: Nl LV, Gr 2 DD, biatrial enlargement.  Marland Kitchen Psychosis   . Morbid obesity with BMI of 50.0-59.9, adult     Ht: 5\' 7"   Wt: 364 lbs. BMI: 57.0  . ANEMIA NOS   . ANXIETY DISORDER, GENERALIZED   . DEGENERATIVE JOINT DISEASE, KNEE   . DISORDER, BIPOLAR NOS   . HYPOTHYROIDISM NOS   . THYROTOXICOSIS   . OSA (obstructive sleep apnea) 03/06/2011  . Chest pain     a. 2012 Myoview: EF 63%, no isch/infarct   History reviewed. No pertinent past surgical history. Family History  Problem Relation Age of Onset  . Heart failure Father   . Stroke Father   . Heart disease Maternal Grandfather   . Heart disease Paternal Grandfather   . Hypertension Mother    History  Substance Use Topics  . Smoking status: Never Smoker   . Smokeless tobacco: Never Used  . Alcohol Use: No   OB History   Grav Para Term Preterm Abortions TAB SAB Ect Mult Living                 Review of Systems  Constitutional: Positive for fever. Negative for chills.  Respiratory: Positive for cough, shortness of breath and wheezing. Negative for chest tightness and stridor.   Cardiovascular: Negative for chest pain, palpitations and leg swelling.  Gastrointestinal: Negative for nausea, vomiting, abdominal pain and diarrhea.  Genitourinary: Negative for dysuria.  Musculoskeletal: Negative for neck pain.  Skin: Negative for rash.    Allergies  Acetaminophen; Caffeine; and Lisinopril  Home Medications   Current Outpatient Rx  Name  Route  Sig  Dispense  Refill  . albuterol (PROVENTIL HFA;VENTOLIN HFA) 108 (90 BASE) MCG/ACT inhaler   Inhalation   Inhale 1-2 puffs into the lungs every 6 (six) hours as needed for wheezing.   1 Inhaler   3   . fosinopril (MONOPRIL) 40 MG tablet      TAKE 1 TABLET (40 MG TOTAL) BY MOUTH DAILY.   90 tablet   3   . furosemide (LASIX) 20 MG tablet   Oral   Take 3 tablets (60 mg total) by mouth daily.   90 tablet   3   . insulin aspart (NOVOLOG FLEXPEN) 100 UNIT/ML injection   Subcutaneous   Inject 15 Units into the skin 3 (three) times daily before meals. Don't take if you don't eat   3 pen    12   . insulin glargine (LANTUS) 100 UNIT/ML injection   Subcutaneous   Inject 0.55 mLs (55 Units total) into the skin at bedtime.   4 pen   12   . metFORMIN (GLUCOPHAGE-XR) 500 MG 24 hr tablet   Oral   Take 2 tablets (1,000 mg total) by mouth 2 (two) times daily with a meal.   120 tablet   3   . potassium chloride (KLOR-CON) 10 MEQ CR tablet   Oral   Take 4 tablets (40 mEq total) by mouth daily.   120 tablet   3   . rosuvastatin (CRESTOR) 10 MG tablet   Oral   Take 1 tablet (10 mg total) by mouth daily.   90 tablet   3    BP 148/86  Pulse 88  Resp 24  SpO2 97%  LMP 08/09/2013 Physical Exam  Nursing note and vitals reviewed. Constitutional: She is oriented to person, place, and time. She appears well-developed and well-nourished. No distress.  HENT:  Head: Normocephalic and atraumatic.  Mouth/Throat: Oropharynx is clear and moist.  Eyes: Conjunctivae and EOM are normal. Pupils are equal, round, and reactive to light.  Neck: Normal range of motion. Neck supple. No JVD present. No tracheal deviation present. No thyromegaly present.  Cardiovascular: Normal rate, regular rhythm and normal heart sounds.   Pulmonary/Chest: No respiratory distress.  Bi-basalar crackles.Able to speak in full sentences.   Abdominal: Soft. Bowel sounds are normal. She exhibits no distension. There is no tenderness. There is no rebound and no guarding.  Musculoskeletal: Normal range of motion.  Lymphadenopathy:    She has no cervical adenopathy.  Neurological: She is alert and oriented to person, place, and time.  Skin: Skin is warm and dry.  Psychiatric: She has a normal mood and affect. Her behavior is normal. Judgment and thought content normal.    ED Course  Procedures (including critical care time) Labs Review Labs Reviewed  CBC WITH DIFFERENTIAL  COMPREHENSIVE METABOLIC PANEL   Imaging Review Dg Chest 2 View  08/30/2013   CLINICAL DATA:  Shortness of breath, fever.  EXAM:  CHEST  2 VIEW  COMPARISON:  January 24, 2013.  FINDINGS: Stable mild cardiomegaly. No pleural effusion or pneumothorax is noted. Stable central pulmonary vascular congestion and diffuse interstitial prominence is noted. Bony thorax is intact.  IMPRESSION: Stable cardiomegaly and central pulmonary vascular congestion as well as bilateral interstitial prominence compared to prior exam.   Electronically Signed   By: Roque Lias M.D.   On: 08/30/2013 14:13    EKG Interpretation    Date/Time:  Wednesday August 30 2013 15:07:12 EST Ventricular Rate:  89 PR Interval:  150 QRS Duration: 74 QT Interval:  362 QTC Calculation: 440 R Axis:   114 Text Interpretation:  Normal sinus rhythm Biatrial enlargement Right axis deviation Anteroseptal infarct , age undetermined Abnormal ECG Confirmed by DELOS  MD, DOUGLAS ([id number]) on 08/30/2013 3:36:46 PM            MDM   1. Bronchitis     Aferbrile. No tachycardia, hypoxia or leukocytosis. Reports relief after neb treatments, wheezing cleared.  Lasix 40 mg IV, tessalon 100 mg PO and haldol 5 mg IV. Ordered dose of steroids but she refused. History of CHF, no dependent edema, not consistent with x-ray. Most likely bronchitis and viral in origin however, pt requested a prescription of antibiotics in case she is not feeling better in a couple days. 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 VS stable for discharge.   Irish Elders, NP 08/31/13 2226

## 2013-08-31 ENCOUNTER — Ambulatory Visit: Payer: Self-pay

## 2013-09-01 NOTE — ED Provider Notes (Signed)
Medical screening examination/treatment/procedure(s) were performed by non-physician practitioner and as supervising physician I was immediately available for consultation/collaboration.  EKG Interpretation    Date/Time:  Wednesday August 30 2013 15:07:12 EST Ventricular Rate:  89 PR Interval:  150 QRS Duration: 74 QT Interval:  362 QTC Calculation: 440 R Axis:   114 Text Interpretation:  Normal sinus rhythm Biatrial enlargement Right axis deviation Anteroseptal infarct , age undetermined Abnormal ECG Confirmed by Malva Cogan  MD, Ameira Alessandrini ([id number]) on 08/30/2013 3:36:46 PM             Geoffery Lyons, MD 09/01/13 1043

## 2013-09-07 ENCOUNTER — Ambulatory Visit: Payer: No Typology Code available for payment source | Attending: Internal Medicine | Admitting: Internal Medicine

## 2013-09-07 ENCOUNTER — Encounter: Payer: Self-pay | Admitting: Internal Medicine

## 2013-09-07 VITALS — BP 209/136 | HR 88 | Temp 98.9°F | Resp 15 | Ht 67.0 in | Wt 367.0 lb

## 2013-09-07 DIAGNOSIS — E119 Type 2 diabetes mellitus without complications: Secondary | ICD-10-CM

## 2013-09-07 DIAGNOSIS — I5032 Chronic diastolic (congestive) heart failure: Secondary | ICD-10-CM

## 2013-09-07 DIAGNOSIS — F319 Bipolar disorder, unspecified: Secondary | ICD-10-CM

## 2013-09-07 DIAGNOSIS — I509 Heart failure, unspecified: Secondary | ICD-10-CM

## 2013-09-07 DIAGNOSIS — G4733 Obstructive sleep apnea (adult) (pediatric): Secondary | ICD-10-CM

## 2013-09-07 DIAGNOSIS — F411 Generalized anxiety disorder: Secondary | ICD-10-CM

## 2013-09-07 MED ORDER — INSULIN ASPART 100 UNIT/ML ~~LOC~~ SOLN: 15.0000 [IU] | Freq: Three times a day (TID) | SUBCUTANEOUS | Status: DC

## 2013-09-07 MED ORDER — INSULIN GLARGINE 100 UNIT/ML ~~LOC~~ SOLN: 55.0000 [IU] | Freq: Every day | SUBCUTANEOUS | Status: DC

## 2013-09-07 MED ORDER — ISOSORB DINITRATE-HYDRALAZINE 20-37.5 MG PO TABS: 1.0000 | ORAL_TABLET | Freq: Two times a day (BID) | ORAL | Status: DC

## 2013-09-07 MED ORDER — ALBUTEROL SULFATE HFA 108 (90 BASE) MCG/ACT IN AERS: 1.0000 | INHALATION_SPRAY | Freq: Four times a day (QID) | RESPIRATORY_TRACT | Status: DC | PRN

## 2013-09-07 MED ORDER — FOSINOPRIL SODIUM 40 MG PO TABS: 40.0000 mg | ORAL_TABLET | Freq: Every day | ORAL | Status: DC

## 2013-09-07 NOTE — Progress Notes (Unsigned)
Patient ID: [name], female   DOB: Feb 03, 1966, 47 y.o.   MRN: [id number] Patient Demographics  [name], is a 47 y.o. female  [id number]  [id number]  DOB - Apr 11, 1966  Chief Complaint  Patient presents with  . Follow-up        Subjective:   [name] today is here for a follow up visit. Patient is a 47 year old female with hypertension, IDDM, OSA, chronic diastolic heart failure, following cardiology, noncompliant with her medications. BP on arrival 209/136. Upon pushing the patient she stated that she did not take her medications today as she wanted to go to the mall for shopping and did not want to have to go to the restroom.  She is appears to be highly noncompliant, states that she does not want her life to be revolved around the BP and she has to " plan her travels and programs" and live life.  Patient has No headache, No chest pain, No abdominal pain - No Nausea, No new weakness tingling or numbness, No Cough - SOB  Objective:    Filed Vitals:   09/07/13 1622  BP: 209/136  Pulse: 88  Temp: 98.9 F (37.2 C)  TempSrc: Oral  Resp: 15  Height: 5\' 7"  (1.702 m)  Weight: 367 lb (166.47 kg)  SpO2: 94%     ALLERGIES:   Allergies  Allergen Reactions  . Acetaminophen     Fits as a child "seizures-like"  . Caffeine     Tense, anxiety, increased urination  . Lisinopril     Rash with lisinopril; but fosinopril is ok per patient    PAST MEDICAL HISTORY: Past Medical History  Diagnosis Date  . Insulin dependent type 2 diabetes mellitus, uncontrolled     Uncontrolled, sugar typically 240.   Marland Kitchen Hypertension   . Chronic diastolic CHF (congestive heart failure)     a. 04/2009 Echo: EF 60-65%, nl wall motion, restrictive physiology & decreased diast compliance, Mild MR, mod dil  LA, PASP .;  b. 01/2011 Echo: Nl LV, Gr 2 DD, biatrial enlargement.  Marland Kitchen Psychosis   . Morbid obesity with BMI of 50.0-59.9, adult     Ht: 5\' 7"  Wt: 364 lbs. BMI: 57.0  .  ANEMIA NOS   . ANXIETY DISORDER, GENERALIZED   . DEGENERATIVE JOINT DISEASE, KNEE   . DISORDER, BIPOLAR NOS   . HYPOTHYROIDISM NOS   . THYROTOXICOSIS   . OSA (obstructive sleep apnea) 03/06/2011  . Chest pain     a. 2012 Myoview: EF 63%, no isch/infarct    MEDICATIONS AT HOME: Prior to Admission medications   Medication Sig Start Date End Date Taking? Authorizing Provider  fosinopril (MONOPRIL) 40 MG tablet Take 1 tablet (40 mg total) by mouth daily. 09/07/13  Yes Filimon Miranda Jenna Luo, MD  furosemide (LASIX) 20 MG tablet Take 3 tablets (60 mg total) by mouth daily. 06/01/13  Yes Karsen Fellows Jenna Luo, MD  insulin aspart (NOVOLOG FLEXPEN) 100 UNIT/ML injection Inject 15 Units into the skin 3 (three) times daily before meals. Don't take if you don't eat 09/07/13  Yes Ricci Dirocco K Chardae Mulkern, MD  insulin glargine (LANTUS) 100 UNIT/ML injection Inject 0.55 mLs (55 Units total) into the skin at bedtime. 09/07/13  Yes Ronna Herskowitz Jenna Luo, MD  metFORMIN (GLUCOPHAGE-XR) 500 MG 24 hr tablet Take 2 tablets (1,000 mg total) by mouth 2 (two) times daily with a meal. 06/01/13  Yes Khushbu Pippen K Kameren Baade, MD  potassium chloride (KLOR-CON) 10 MEQ CR tablet Take 4 tablets (40 mEq  total) by mouth daily. 08/22/12  Yes Clanford Cyndie Mull, MD  rosuvastatin (CRESTOR) 10 MG tablet Take 1 tablet (10 mg total) by mouth daily. 06/01/13  Yes Tarita Deshmukh Jenna Luo, MD  albuterol (PROVENTIL HFA;VENTOLIN HFA) 108 (90 BASE) MCG/ACT inhaler Inhale 1-2 puffs into the lungs every 6 (six) hours as needed for wheezing. 06/01/13   Selah Klang Jenna Luo, MD  albuterol (PROVENTIL HFA;VENTOLIN HFA) 108 (90 BASE) MCG/ACT inhaler Inhale 1-2 puffs into the lungs every 6 (six) hours as needed for wheezing or shortness of breath. 09/07/13   Faviola Klare Jenna Luo, MD  azithromycin (ZITHROMAX Z-PAK) 250 MG tablet Take two tablets the first day and then one everyday after. 08/30/13   Irish Elders, NP  guaiFENesin-codeine 100-10 MG/5ML syrup Take 10 mLs by mouth 3 (three) times daily as needed for cough.  08/30/13   Irish Elders, NP  isosorbide-hydrALAZINE (BIDIL) 20-37.5 MG per tablet Take 1 tablet by mouth 2 (two) times daily. 09/07/13   Genesia Caslin Jenna Luo, MD  Polyethyl Glycol-Propyl Glycol (SYSTANE) 0.4-0.3 % SOLN Apply 1 drop to eye.    Historical Provider, MD     Exam  General appearance :Awake, alert, NAD, Speech Clear.  HEENT: Atraumatic and Normocephalic, PERLA Neck: supple, no JVD. No cervical lymphadenopathy.  Chest: Clear to auscultation bilaterally, no wheezing, rales or rhonchi CVS: S1 S2 regular, no murmurs.  Abdomen: Morbidly obese soft, NBS, NT, ND, no gaurding, rigidity or rebound. Extremities: no cyanosis or clubbing, B/L Lower Ext shows no edema Neurology: Awake alert, and oriented X 3, CN II-XII intact, Non focal Skin: No Rash or lesions Wounds:N/A    Data Review   Basic Metabolic Panel: No results found for this basename: NA, K, CL, CO2, GLUCOSE, BUN, CREATININE, CALCIUM, MG, PHOS,  in the last 168 hours Liver Function Tests: No results found for this basename: AST, ALT, ALKPHOS, BILITOT, PROT, ALBUMIN,  in the last 168 hours  CBC: No results found for this basename: WBC, NEUTROABS, HGB, HCT, MCV, PLT,  in the last 168 hours  ------------------------------------------------------------------------------------------------------------------ No results found for this basename: HGBA1C,  in the last 72 hours ------------------------------------------------------------------------------------------------------------------ No results found for this basename: CHOL, HDL, LDLCALC, TRIG, CHOLHDL, LDLDIRECT,  in the last 72 hours ------------------------------------------------------------------------------------------------------------------ No results found for this basename: TSH, T4TOTAL, FREET3, T3FREE, THYROIDAB,  in the last 72 hours ------------------------------------------------------------------------------------------------------------------ No results found for  this basename: VITAMINB12, FOLATE, FERRITIN, TIBC, IRON, RETICCTPCT,  in the last 72 hours  Coagulation profile  No results found for this basename: INR, PROTIME,  in the last 168 hours    Assessment & Plan   Active Problems: Accelerated hypertension: Due to noncompliance - I have also placed her on Bidil today, starting at 1 tablet twice a day, she has an appointment with cardiology on 09/22/2013, may need to increase it to TID, but will defer to cardiology - Continue Lasix and ACEI  Chronic diastolic failure: Likely she has significant diet And medication noncompliance - Will continue Lasix, ACE inhibitor, BiDil, - Counseled patient strongly on diet and weight control  Morbid obesity -  Consultation on weight control and exercise  Bronchitis: Improving - I gave her the prescription of albuterol inhaler I explained to her that she can use it if she is wheezing.  Insulin-dependent diabetes mellitus: Significant noncompliance issues - Refilled insulin regimen   Recommendations:Followup with cardiology and recommendations   Follow-up in 3 months     Irisa Grimsley M.D. 09/07/2013, 4:47 PM

## 2013-09-07 NOTE — Progress Notes (Unsigned)
Pt is here for f/u. Complains of shortness of breath x1 year or more, comes severe and cough x2 weeks, watery and green mucus, now thick. Feels like she is in distress.

## 2013-09-11 ENCOUNTER — Ambulatory Visit: Payer: No Typology Code available for payment source | Admitting: Cardiology

## 2013-09-22 ENCOUNTER — Encounter (INDEPENDENT_AMBULATORY_CARE_PROVIDER_SITE_OTHER): Payer: Self-pay

## 2013-09-22 ENCOUNTER — Encounter: Payer: Self-pay | Admitting: Cardiology

## 2013-09-22 ENCOUNTER — Ambulatory Visit (INDEPENDENT_AMBULATORY_CARE_PROVIDER_SITE_OTHER): Payer: No Typology Code available for payment source | Admitting: Cardiology

## 2013-09-22 VITALS — BP 180/100 | HR 115 | Ht 67.0 in | Wt 348.0 lb

## 2013-09-22 DIAGNOSIS — R0789 Other chest pain: Secondary | ICD-10-CM

## 2013-09-22 DIAGNOSIS — I10 Essential (primary) hypertension: Secondary | ICD-10-CM

## 2013-09-22 DIAGNOSIS — I509 Heart failure, unspecified: Secondary | ICD-10-CM

## 2013-09-22 DIAGNOSIS — I5032 Chronic diastolic (congestive) heart failure: Secondary | ICD-10-CM

## 2013-09-22 MED ORDER — METOPROLOL SUCCINATE ER 50 MG PO TB24: 50.0000 mg | ORAL_TABLET | Freq: Every day | ORAL | Status: DC

## 2013-09-22 NOTE — Assessment & Plan Note (Signed)
Patient needs weight loss. 

## 2013-09-22 NOTE — Assessment & Plan Note (Signed)
Blood pressure is elevated but she has not taken her medications this morning. In reviewing records primary care initiated bidil but patient states she is not taking this medication. I've asked her to bring her records and medications with each office visit. I last her to take her medicines on arrival at home. Last Toprol 50 mg daily. She should track her blood pressure at home and we will increase medications as needed.

## 2013-09-22 NOTE — Progress Notes (Signed)
HPI: FU chronic diastolic heart failure, morbid obesity with a BMI greater than 40. Echo 11/14: Normal LV function; grade 2 diastolic dysfunction, severe LAE. Nuclear study February 2014 showed an ejection fraction of 71%. There is soft tissue attenuation but no ischemia. Last seen Nov 2014. I recommended a heart catheterization but she has declined. I resumed her diuretics. Since then, her dyspnea is much improved. She has some dyspnea on exertion but no pedal edema. She denies recurrent chest pain. She has not taken her blood pressure medications this morning.   Current Outpatient Prescriptions  Medication Sig Dispense Refill  . albuterol (PROVENTIL HFA;VENTOLIN HFA) 108 (90 BASE) MCG/ACT inhaler Inhale 1-2 puffs into the lungs every 6 (six) hours as needed for wheezing or shortness of breath.  1 Inhaler  3  . fosinopril (MONOPRIL) 40 MG tablet Take 1 tablet (40 mg total) by mouth daily.  30 tablet  5  . furosemide (LASIX) 20 MG tablet Take 3 tablets (60 mg total) by mouth daily.  90 tablet  3  . insulin aspart (NOVOLOG FLEXPEN) 100 UNIT/ML injection Inject 15 Units into the skin 3 (three) times daily before meals. Don't take if you don't eat  5 pen  12  . insulin glargine (LANTUS) 100 UNIT/ML injection Inject 0.55 mLs (55 Units total) into the skin at bedtime.  5 pen  12  . isosorbide-hydrALAZINE (BIDIL) 20-37.5 MG per tablet Take 1 tablet by mouth 2 (two) times daily.  60 tablet  3  . metFORMIN (GLUCOPHAGE-XR) 500 MG 24 hr tablet Take 2 tablets (1,000 mg total) by mouth 2 (two) times daily with a meal.  120 tablet  3  . potassium chloride (KLOR-CON) 10 MEQ CR tablet Take 4 tablets (40 mEq total) by mouth daily.  120 tablet  3  . rosuvastatin (CRESTOR) 10 MG tablet Take 1 tablet (10 mg total) by mouth daily.  90 tablet  3   No current facility-administered medications for this visit.     Past Medical History  Diagnosis Date  . Insulin dependent type 2 diabetes mellitus,  uncontrolled     Uncontrolled, sugar typically 240.   Marland Kitchen Hypertension   . Chronic diastolic CHF (congestive heart failure)     a. 04/2009 Echo: EF 60-65%, nl wall motion, restrictive physiology & decreased diast compliance, Mild MR, mod dil  LA, PASP .;  b. 01/2011 Echo: Nl LV, Gr 2 DD, biatrial enlargement.  Marland Kitchen Psychosis   . Morbid obesity with BMI of 50.0-59.9, adult     Ht: 5\' 7"  Wt: 364 lbs. BMI: 57.0  . ANEMIA NOS   . ANXIETY DISORDER, GENERALIZED   . DEGENERATIVE JOINT DISEASE, KNEE   . DISORDER, BIPOLAR NOS   . HYPOTHYROIDISM NOS   . THYROTOXICOSIS   . OSA (obstructive sleep apnea) 03/06/2011  . Chest pain     a. 2012 Myoview: EF 63%, no isch/infarct    History reviewed. No pertinent past surgical history.  History   Social History  . Marital Status: Single    Spouse Name: N/A    Number of Children: 0  . Years of Education: 69   Occupational History  . unemployed    Social History Main Topics  . Smoking status: Never Smoker   . Smokeless tobacco: Never Used  . Alcohol Use: No  . Drug Use: No  . Sexual Activity: Not Currently    Birth Control/ Protection: None   Other Topics Concern  . 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 But ran out of money and is no longer attending school. (Note patient has bipolar disorder).      Born in [place] but lived in [place] before coming back to [place].       Primary language is Arabic. Lives with mother and brother.                ROS: no fevers or chills, productive cough, hemoptysis, dysphasia, odynophagia, melena, hematochezia, dysuria, hematuria, rash, seizure activity, orthopnea, PND, pedal edema, claudication. Remaining systems are negative.  Physical Exam: Well-developed obese in no acute distress.  Skin is warm and dry.  HEENT is normal.  Neck is supple.  Chest is clear to auscultation with normal  expansion.  Cardiovascular exam is regular rate and rhythm.  Abdominal exam nontender or distended. No masses palpated. Extremities show no edema. neuro grossly intact

## 2013-09-22 NOTE — Patient Instructions (Signed)
Your physician recommends that you schedule a follow-up appointment in: ONE WEEK TO EVALUATE BLOOD PRESSURE AND ADJUST MEDICATION  START METOPROLOL SUCC ER 50 MG ONCE DAILY  BRING ALL MEDICINE TO NEXT OFFICE VISIT

## 2013-09-22 NOTE — Assessment & Plan Note (Signed)
Symptoms of dyspnea and congestive heart failure much improved. Continue Lasix at present dose.

## 2013-09-22 NOTE — Assessment & Plan Note (Signed)
Previous nuclear study negative. She has declined cardiac catheterization. Symptoms have resolved.

## 2013-09-29 ENCOUNTER — Ambulatory Visit: Payer: No Typology Code available for payment source

## 2013-09-29 ENCOUNTER — Encounter: Payer: Self-pay | Admitting: *Deleted

## 2013-09-29 ENCOUNTER — Ambulatory Visit (INDEPENDENT_AMBULATORY_CARE_PROVIDER_SITE_OTHER): Payer: No Typology Code available for payment source | Admitting: *Deleted

## 2013-09-29 VITALS — BP 184/78 | HR 63 | Ht 67.0 in | Wt 345.5 lb

## 2013-09-29 DIAGNOSIS — I10 Essential (primary) hypertension: Secondary | ICD-10-CM

## 2013-09-29 NOTE — Progress Notes (Signed)
[name] came in today with her Mom for a BP check.  She was last seen by Dr [name] on 09/22/13 and her BP was elevated.  Dr [name] added Metoprolol and requested  to have a BP check in a week with medication adjustment.  She is here today for that, has a bag of medications with her.  One on which is the Torsemide 20mg .  It is dated 04/2012 and was written to take 3 tablets twice daily.  She states she has only been taking it once daily.  I let her know the medication had expired and I would have to discuss with Dr [name].  The notes state she is taking Furosemide but she does not have that only the expired Torsemide.  BP is still elevated at 184/78(better) but her weight and HR are better. Weight is down 3 pounds and her HR is now 63 from 115.   I have updated medication list as to what the [name] is currently taking.   I let her know I would forward to Dr. [name] for his recommendations.

## 2013-10-03 ENCOUNTER — Telehealth: Payer: Self-pay | Admitting: Emergency Medicine

## 2013-10-05 MED ORDER — FUROSEMIDE 20 MG PO TABS: ORAL_TABLET | ORAL | Status: DC

## 2013-10-05 NOTE — Progress Notes (Signed)
Pt should take all meds as outlined in list on last ov including lasix 60 mg daily, bmet one week, fu 2 weeks with pa for bp eval. Bring ALL meds [place] with pt, Aware of dr Jacalyn Lefevre recommendations. Follow up scheduled

## 2013-10-05 NOTE — Addendum Note (Signed)
Addended by: Cristopher Estimable on: 10/05/2013 03:31 PM   Modules accepted: Orders, Medications

## 2013-10-18 ENCOUNTER — Ambulatory Visit (INDEPENDENT_AMBULATORY_CARE_PROVIDER_SITE_OTHER): Payer: No Typology Code available for payment source | Admitting: Physician Assistant

## 2013-10-18 ENCOUNTER — Encounter: Payer: Self-pay | Admitting: Physician Assistant

## 2013-10-18 VITALS — BP 140/80 | HR 57 | Wt 358.0 lb

## 2013-10-18 DIAGNOSIS — I119 Hypertensive heart disease without heart failure: Secondary | ICD-10-CM

## 2013-10-18 DIAGNOSIS — I509 Heart failure, unspecified: Secondary | ICD-10-CM

## 2013-10-18 DIAGNOSIS — I10 Essential (primary) hypertension: Secondary | ICD-10-CM

## 2013-10-18 DIAGNOSIS — Z794 Long term (current) use of insulin: Secondary | ICD-10-CM

## 2013-10-18 DIAGNOSIS — I5032 Chronic diastolic (congestive) heart failure: Secondary | ICD-10-CM

## 2013-10-18 DIAGNOSIS — IMO0001 Reserved for inherently not codable concepts without codable children: Secondary | ICD-10-CM

## 2013-10-18 DIAGNOSIS — E119 Type 2 diabetes mellitus without complications: Secondary | ICD-10-CM | POA: Insufficient documentation

## 2013-10-18 DIAGNOSIS — R0602 Shortness of breath: Secondary | ICD-10-CM

## 2013-10-18 HISTORY — DX: Hypertensive heart disease without heart failure: I11.9

## 2013-10-18 MED ORDER — FUROSEMIDE 20 MG PO TABS: ORAL_TABLET | ORAL | Status: DC

## 2013-10-18 NOTE — Patient Instructions (Signed)
LAB WORK TODAY; BMET, BNP  PLEASE FOLLOW UP WITH SCOTT WEAVER, [facility] 12/25/13 @ 3:40  NO CHANGES WITH YOUR MEDICATION   2 Gram Low Sodium Diet A 2 gram sodium diet restricts the amount of sodium in the diet to no more than 2 g or 2000 mg daily. Limiting the amount of sodium is often used to help lower blood pressure. It is important if you have heart, liver, or kidney problems. Many foods contain sodium for flavor and sometimes as a preservative. When the amount of sodium in a diet needs to be low, it is important to know what to look for when choosing foods and drinks. The following includes some information and guidelines to help make it easier for you to adapt to a low sodium diet. QUICK TIPS  Do not add salt to food.  Avoid convenience items and fast food.  Choose unsalted snack foods.  Buy lower sodium products, often labeled as "lower sodium" or "no salt added."  Check food labels to learn how much sodium is in 1 serving.  When eating at a restaurant, ask that your food be prepared with less salt or none, if possible. READING FOOD LABELS FOR SODIUM INFORMATION The nutrition facts label is a good place to find how much sodium is in foods. Look for products with no more than 500 to 600 mg of sodium per meal and no more than 150 mg per serving. Remember that 2 g = 2000 mg. The food label may also list foods as:  Sodium-free: Less than 5 mg in a serving.  Very low sodium: 35 mg or less in a serving.  Low-sodium: 140 mg or less in a serving.  Light in sodium: 50% less sodium in a serving. For example, if a food that usually has 300 mg of sodium is changed to become light in sodium, it will have 150 mg of sodium.  Reduced sodium: 25% less sodium in a serving. For example, if a food that usually has 400 mg of sodium is changed to reduced sodium, it will have 300 mg of sodium. CHOOSING FOODS Grains  Avoid: Salted crackers and snack items. Some cereals, including instant hot cereals.  Bread stuffing and biscuit mixes. Seasoned rice or pasta mixes.  Choose: Unsalted snack items. Low-sodium cereals, oats, puffed wheat and rice, shredded wheat. English muffins and bread. Pasta. Meats  Avoid: Salted, canned, smoked, spiced, pickled meats, including fish and poultry. Bacon, ham, sausage, cold cuts, hot dogs, anchovies.  Choose: Low-sodium canned tuna and salmon. Fresh or frozen meat, poultry, and fish. Dairy  Avoid: Processed cheese and spreads. Cottage cheese. Buttermilk and condensed milk. Regular cheese.  Choose: Milk. Low-sodium cottage cheese. Yogurt. Sour cream. Low-sodium cheese. Fruits and Vegetables  Avoid: Regular canned vegetables. Regular canned tomato sauce and paste. Frozen vegetables in sauces. Olives. Angie Fava. Relishes. Sauerkraut.  Choose: Low-sodium canned vegetables. Low-sodium tomato sauce and paste. Frozen or fresh vegetables. Fresh and frozen fruit. Condiments  Avoid: Canned and packaged gravies. Worcestershire sauce. Tartar sauce. Barbecue sauce. Soy sauce. Steak sauce. Ketchup. Onion, garlic, and table salt. Meat flavorings and tenderizers.  Choose: Fresh and dried herbs and spices. Low-sodium varieties of mustard and ketchup. Lemon juice. Tabasco sauce. Horseradish. SAMPLE 2 GRAM SODIUM MEAL PLAN Breakfast / Sodium (mg)  1 cup low-fat milk / 834 mg  2 slices whole-wheat toast / 270 mg  1 tbs heart-healthy margarine / 153 mg  1 hard-boiled egg / 139 mg  1 small orange / 0 mg  Lunch / Sodium (mg)  1 cup raw carrots / 76 mg   cup hummus / 298 mg  1 cup low-fat milk / 143 mg   cup red grapes / 2 mg  1 whole-wheat pita bread / 356 mg Dinner / Sodium (mg)  1 cup whole-wheat pasta / 2 mg  1 cup low-sodium tomato sauce / 73 mg  3 oz lean ground beef / 57 mg  1 small side salad (1 cup raw spinach leaves,  cup cucumber,  cup yellow bell pepper) with 1 tsp olive oil and 1 tsp red wine vinegar / 25 mg Snack / Sodium (mg)  1  container low-fat vanilla yogurt / 107 mg  3 graham cracker squares / 127 mg Nutrient Analysis  Calories: 2033  Protein: 77 g  Carbohydrate: 282 g  Fat: 72 g  Sodium: 1971 mg Document Released: 09/14/2005 Document Revised: 12/07/2011 Document Reviewed: 12/16/2009 Exit[employer] Patient Information 2014 [place].

## 2013-10-18 NOTE — Progress Notes (Signed)
[address] [place], [place]  [address] Phone: [phone] Fax:  [phone]  Date:  10/18/2013   ID:  [name], DOB 03/11/1966, MRN [id number]  PCP:  No PCP Per Patient  Cardiologist:  Dr. Kirk [name]    History of Present Illness: [name] is a 48 y.o. female with a hx of chest pain, diastolic CHF, morbid obesity, HTN, IDDM, sleep apnea, bipolar d/o with psychosis.  Lexiscan Myoview (10/2012):  Inferoseptal defect c/w soft tissue attenuation, no ischemia, EF 71% (normal study).  Echo (07/2013):  Severe LVH, EF 55-60%, Gr 2 DD, severe LAE, PASP 63.  Dr. Stanford [name] recommended cardiac cath for evaluation of chest pain in 06/2013 but she declined.  Last seen by Dr. Stanford [name] 09/22/13.  BP was markedly elevated.  She had not taken her medications yet.  She was started on Toprol.  She returns for follow up today.    She is here with her mother today. She came in several weeks ago and had a BP check.  Her medications included an expired Rx for Torsemide (she was taking 60 mg QD).  This was stopped and she was placed back on Lasix 60 QD.  She notes a 10 lb weight gain.  She notes some increased LE edema.  This is mainly dependant.  She denies chest pain.  Denies any worsening dyspnea.  She notes NYHA Class 2-2b symptoms.  She sleeps on 2 pillows chronically.  She denies syncope or PND.    Recent Labs: 08/07/2013: Pro B Natriuretic peptide (BNP) 488.0*  08/30/2013: ALT 8; Creatinine 0.47*; Hemoglobin 12.3; Potassium 4.4   Wt Readings from Last 3 Encounters:  10/18/13 358 lb (162.388 kg)  09/29/13 345 lb 8 oz (156.718 kg)  09/22/13 348 lb (157.852 kg)     Past Medical History  Diagnosis Date  . Insulin dependent type 2 diabetes mellitus, uncontrolled     Uncontrolled, sugar typically 240.   Marland Kitchen Hypertension   . Chronic diastolic CHF (congestive heart failure)     a. 04/2009 Echo: EF 60-65%, nl wall motion, restrictive physiology & decreased diast compliance, Mild MR, mod  dil  LA, PASP 73mmHg.;  b. 01/2011 Echo: Nl LV, Gr 2 DD, biatrial enlargement.  Marland Kitchen Psychosis   . Morbid obesity with BMI of 50.0-59.9, adult     Ht: 5\' 7"  Wt: 364 lbs. BMI: 57.0  . ANEMIA NOS   . ANXIETY DISORDER, GENERALIZED   . DEGENERATIVE JOINT DISEASE, KNEE   . DISORDER, BIPOLAR NOS   . HYPOTHYROIDISM NOS   . THYROTOXICOSIS   . OSA (obstructive sleep apnea) 03/06/2011  . Chest pain     a. 2012 Myoview: EF 63%, no isch/infarct    Current Outpatient Prescriptions  Medication Sig Dispense Refill  . albuterol (PROVENTIL HFA;VENTOLIN HFA) 108 (90 BASE) MCG/ACT inhaler Inhale 1-2 puffs into the lungs every 6 (six) hours as needed for wheezing or shortness of breath.  1 Inhaler  3  . fosinopril (MONOPRIL) 40 MG tablet Take 1 tablet (40 mg total) by mouth daily.  30 tablet  5  . furosemide (LASIX) 20 MG tablet Take three tablets every morning  90 tablet  12  . insulin aspart (NOVOLOG FLEXPEN) 100 UNIT/ML injection Inject 15 Units into the skin 3 (three) times daily before meals. Don't take if you don't eat  5 pen  12  . insulin glargine (LANTUS) 100 UNIT/ML injection Inject 0.55 mLs (55 Units total) into the skin at bedtime.  5  pen  12  . metFORMIN (GLUCOPHAGE-XR) 500 MG 24 hr tablet Take 1,000 mg by mouth daily with breakfast.      . metoprolol succinate (TOPROL-XL) 50 MG 24 hr tablet Take 1 tablet (50 mg total) by mouth daily. Take with or immediately following a meal.  90 tablet  3  . potassium chloride (KLOR-CON) 10 MEQ CR tablet Take 4 tablets (40 mEq total) by mouth daily.  120 tablet  3  . rosuvastatin (CRESTOR) 10 MG tablet Take 1 tablet (10 mg total) by mouth daily.  90 tablet  3  . sertraline (ZOLOFT) 50 MG tablet Take 50 mg by mouth daily.      . traZODone (DESYREL) 50 MG tablet Take 50 mg by mouth at bedtime.       No current facility-administered medications for this visit.    Allergies:   Acetaminophen; Caffeine; and Lisinopril   Social History:  The patient  reports that she  has never smoked. She has never used smokeless tobacco. She reports that she does not drink alcohol or use illicit drugs.   Family History:  The patient's family history includes Heart disease in her maternal grandfather and paternal grandfather; Heart failure in her father; Hypertension in her mother; Stroke in her father.   ROS:  Please see the history of present illness.  She had symptomatic hypoglycemia recently and had to call EMS.    All other systems reviewed and negative.   PHYSICAL EXAM: VS:  BP 140/80  Pulse 57  Wt 358 lb (162.388 kg) Well nourished, well developed, in no acute distress HEENT: normal Neck: I cannot assess JVD Cardiac:  Distant S1, S2; RRR; no murmur Lungs:  Decreased bilaterally, no wheezing, rhonchi or rales Abd: soft, nontender, no hepatomegaly Ext: very trace bilateral LE edema Skin: warm and dry Neuro:  CNs 2-12 intact, no focal abnormalities noted  EKG:  NSR, HR 57, RAD, NSSTTW changes     ASSESSMENT AND PLAN:  1. Hypertensive Heart Disease:  BP is improved.  She is almost to target.  Will provide her with information for a low Na diet.  Continue current Rx. I will see her back in 2 mos.  If BP remains 140 or higher consider adding Amlodipine.   2. Chronic Diastolic CHF:  Volume appears stable.  She has stable DOE.  However, her weight is up and she notes some increased LE edema.  She feels like her weight increased after changing Torsemide to Furosemide.  Will check BMET and BNP today.  If BNP higher, increase Furosemide.   3. Diabetes Mellitus:  Follow up with primary care.  She may need adjustment in insulin with recent hypoglycemia.  4. Disposition:  F/u with me in 2 months.  Signed, Richardson Dopp, PA-C  10/18/2013 3:34 PM

## 2013-10-18 NOTE — Addendum Note (Signed)
Addended by: Michae Kava on: 10/18/2013 04:20 PM   Modules accepted: Orders

## 2013-10-19 LAB — BASIC METABOLIC PANEL
BUN: 22 mg/dL (ref 6–23)
CHLORIDE: 107 meq/L (ref 96–112)
CO2: 30 meq/L (ref 19–32)
Calcium: 8.9 mg/dL (ref 8.4–10.5)
Creatinine, Ser: 1.1 mg/dL (ref 0.4–1.2)
GFR: 68.16 mL/min (ref >60.00)
Glucose, Bld: 112 mg/dL — ABNORMAL HIGH (ref 70–99)
POTASSIUM: 5.4 meq/L — AB (ref 3.5–5.1)
Sodium: 142 mEq/L (ref 135–145)

## 2013-10-19 LAB — BRAIN NATRIURETIC PEPTIDE: PRO B NATRI PEPTIDE: 912 pg/mL — AB (ref 0.0–100.0)

## 2013-10-20 ENCOUNTER — Telehealth: Payer: Self-pay | Admitting: *Deleted

## 2013-10-20 DIAGNOSIS — R0602 Shortness of breath: Secondary | ICD-10-CM

## 2013-10-20 DIAGNOSIS — I10 Essential (primary) hypertension: Secondary | ICD-10-CM

## 2013-10-20 DIAGNOSIS — I119 Hypertensive heart disease without heart failure: Secondary | ICD-10-CM

## 2013-10-20 DIAGNOSIS — I5032 Chronic diastolic (congestive) heart failure: Secondary | ICD-10-CM

## 2013-10-20 MED ORDER — FUROSEMIDE 20 MG PO TABS: ORAL_TABLET | ORAL | Status: DC

## 2013-10-20 NOTE — Telephone Encounter (Signed)
s/w pt's brother Jeanella Flattery who is a Dr, he has been notified about lab results and to hold K+ x 1 day then re-start, increase lasix 60 A/40 P, bmet, bnp 10/27/13.

## 2013-10-20 NOTE — Telephone Encounter (Signed)
Lmptcb on both the pt's and mother #'s for ptcb go over lab results.

## 2013-10-27 ENCOUNTER — Other Ambulatory Visit (INDEPENDENT_AMBULATORY_CARE_PROVIDER_SITE_OTHER): Payer: No Typology Code available for payment source

## 2013-10-27 DIAGNOSIS — I10 Essential (primary) hypertension: Secondary | ICD-10-CM

## 2013-10-27 DIAGNOSIS — I5032 Chronic diastolic (congestive) heart failure: Secondary | ICD-10-CM

## 2013-10-27 DIAGNOSIS — R0602 Shortness of breath: Secondary | ICD-10-CM

## 2013-10-27 DIAGNOSIS — I509 Heart failure, unspecified: Secondary | ICD-10-CM

## 2013-10-27 DIAGNOSIS — I119 Hypertensive heart disease without heart failure: Secondary | ICD-10-CM

## 2013-10-27 LAB — BASIC METABOLIC PANEL
BUN: 15 mg/dL (ref 6–23)
CHLORIDE: 107 meq/L (ref 96–112)
CO2: 29 meq/L (ref 19–32)
Calcium: 8.8 mg/dL (ref 8.4–10.5)
Creatinine, Ser: 0.9 mg/dL (ref 0.4–1.2)
GFR: 90.28 mL/min (ref >60.00)
Glucose, Bld: 52 mg/dL — ABNORMAL LOW (ref 70–99)
Potassium: 3.7 mEq/L (ref 3.5–5.1)
Sodium: 143 mEq/L (ref 135–145)

## 2013-10-30 ENCOUNTER — Telehealth: Payer: Self-pay | Admitting: *Deleted

## 2013-10-30 LAB — BRAIN NATRIURETIC PEPTIDE: Pro B Natriuretic peptide (BNP): 945 pg/mL — ABNORMAL HIGH (ref 0.0–100.0)

## 2013-10-30 NOTE — Telephone Encounter (Signed)
s/w to pt's mother due to language barrier about lab results. I verified per mother that pt is checking her glucose. Mother said they have an appt with PCP but not until 2/12, asked if we could try to sooner appt for them. I will ask scheduling to try

## 2013-11-02 ENCOUNTER — Ambulatory Visit: Payer: No Typology Code available for payment source

## 2013-11-02 ENCOUNTER — Encounter (HOSPITAL_COMMUNITY): Payer: Self-pay | Admitting: Emergency Medicine

## 2013-11-02 ENCOUNTER — Inpatient Hospital Stay (HOSPITAL_COMMUNITY)
Admission: EM | Admit: 2013-11-02 | Discharge: 2013-11-07 | DRG: 292 | Disposition: A | Discharge status: Home Health | Payer: No Typology Code available for payment source | Attending: Family Medicine | Admitting: Family Medicine

## 2013-11-02 ENCOUNTER — Emergency Department (HOSPITAL_COMMUNITY)
Admission: EM | Admit: 2013-11-02 | Discharge: 2013-11-02 | Disposition: A | Discharge status: Other Acute Inpatient Hospital | Payer: No Typology Code available for payment source | Source: Home / Self Care | Attending: Family Medicine | Admitting: Family Medicine

## 2013-11-02 ENCOUNTER — Emergency Department (HOSPITAL_COMMUNITY): Payer: No Typology Code available for payment source

## 2013-11-02 DIAGNOSIS — IMO0001 Reserved for inherently not codable concepts without codable children: Secondary | ICD-10-CM

## 2013-11-02 DIAGNOSIS — I5033 Acute on chronic diastolic (congestive) heart failure: Secondary | ICD-10-CM | POA: Diagnosis present

## 2013-11-02 DIAGNOSIS — E119 Type 2 diabetes mellitus without complications: Secondary | ICD-10-CM

## 2013-11-02 DIAGNOSIS — I5032 Chronic diastolic (congestive) heart failure: Secondary | ICD-10-CM

## 2013-11-02 DIAGNOSIS — I272 Pulmonary hypertension, unspecified: Secondary | ICD-10-CM | POA: Diagnosis present

## 2013-11-02 DIAGNOSIS — E1065 Type 1 diabetes mellitus with hyperglycemia: Secondary | ICD-10-CM | POA: Diagnosis present

## 2013-11-02 DIAGNOSIS — F411 Generalized anxiety disorder: Secondary | ICD-10-CM | POA: Diagnosis present

## 2013-11-02 DIAGNOSIS — I110 Hypertensive heart disease with heart failure: Principal | ICD-10-CM | POA: Diagnosis present

## 2013-11-02 DIAGNOSIS — E870 Hyperosmolality and hypernatremia: Secondary | ICD-10-CM | POA: Diagnosis present

## 2013-11-02 DIAGNOSIS — I2789 Other specified pulmonary heart diseases: Secondary | ICD-10-CM | POA: Diagnosis present

## 2013-11-02 DIAGNOSIS — I509 Heart failure, unspecified: Secondary | ICD-10-CM

## 2013-11-02 DIAGNOSIS — I119 Hypertensive heart disease without heart failure: Secondary | ICD-10-CM | POA: Diagnosis present

## 2013-11-02 DIAGNOSIS — E162 Hypoglycemia, unspecified: Secondary | ICD-10-CM

## 2013-11-02 DIAGNOSIS — I10 Essential (primary) hypertension: Secondary | ICD-10-CM

## 2013-11-02 DIAGNOSIS — E1069 Type 1 diabetes mellitus with other specified complication: Secondary | ICD-10-CM

## 2013-11-02 DIAGNOSIS — G4733 Obstructive sleep apnea (adult) (pediatric): Secondary | ICD-10-CM | POA: Diagnosis present

## 2013-11-02 DIAGNOSIS — N390 Urinary tract infection, site not specified: Secondary | ICD-10-CM | POA: Diagnosis present

## 2013-11-02 DIAGNOSIS — Z9119 Patient's noncompliance with other medical treatment and regimen: Secondary | ICD-10-CM

## 2013-11-02 DIAGNOSIS — Z6841 Body Mass Index (BMI) 40.0 and over, adult: Secondary | ICD-10-CM

## 2013-11-02 DIAGNOSIS — R0602 Shortness of breath: Secondary | ICD-10-CM

## 2013-11-02 DIAGNOSIS — Z91199 Patient's noncompliance with other medical treatment and regimen due to unspecified reason: Secondary | ICD-10-CM

## 2013-11-02 DIAGNOSIS — F319 Bipolar disorder, unspecified: Secondary | ICD-10-CM | POA: Diagnosis present

## 2013-11-02 DIAGNOSIS — Z79899 Other long term (current) drug therapy: Secondary | ICD-10-CM

## 2013-11-02 DIAGNOSIS — Z794 Long term (current) use of insulin: Secondary | ICD-10-CM

## 2013-11-02 DIAGNOSIS — IMO0002 Reserved for concepts with insufficient information to code with codable children: Secondary | ICD-10-CM | POA: Diagnosis present

## 2013-11-02 HISTORY — DX: Personal history of other endocrine, nutritional and metabolic disease: Z86.39

## 2013-11-02 HISTORY — DX: Hypertensive heart disease without heart failure: I11.9

## 2013-11-02 HISTORY — DX: Bipolar disorder, unspecified: F31.9

## 2013-11-02 HISTORY — DX: Acute on chronic diastolic (congestive) heart failure: I50.33

## 2013-11-02 LAB — CBC
HEMATOCRIT: 36.4 % (ref 36.0–46.0)
HEMOGLOBIN: 11.5 g/dL — AB (ref 12.0–15.0)
MCH: 26.9 pg (ref 26.0–34.0)
MCHC: 31.6 g/dL (ref 30.0–36.0)
MCV: 85 fL (ref 78.0–100.0)
Platelets: 487 10*3/uL — ABNORMAL HIGH (ref 150–400)
RBC: 4.28 MIL/uL (ref 3.87–5.11)
RDW: 16.8 % — ABNORMAL HIGH (ref 11.5–15.5)
WBC: 7.5 10*3/uL (ref 4.0–10.5)

## 2013-11-02 LAB — HEPATIC FUNCTION PANEL
ALBUMIN: 2.7 g/dL — AB (ref 3.5–5.2)
ALT: 18 U/L (ref 0–35)
AST: 31 U/L (ref 0–37)
Alkaline Phosphatase: 127 U/L — ABNORMAL HIGH (ref 39–117)
BILIRUBIN TOTAL: 0.3 mg/dL (ref 0.3–1.2)
Bilirubin, Direct: <0.2 mg/dL (ref 0.0–0.3)
Total Protein: 6.6 g/dL (ref 6.0–8.3)

## 2013-11-02 LAB — POCT I-STAT TROPONIN I: TROPONIN I, POC: 0.02 ng/mL (ref 0.00–0.08)

## 2013-11-02 LAB — BASIC METABOLIC PANEL
BUN: 16 mg/dL (ref 6–23)
CALCIUM: 8.8 mg/dL (ref 8.4–10.5)
CHLORIDE: 106 meq/L (ref 96–112)
CO2: 26 meq/L (ref 19–32)
Creatinine, Ser: 0.88 mg/dL (ref 0.50–1.10)
GFR calc Af Amer: >90 mL/min (ref >90)
GFR calc non Af Amer: 78 mL/min — ABNORMAL LOW (ref >90)
Glucose, Bld: 44 mg/dL — CL (ref 70–99)
Potassium: 3.7 mEq/L (ref 3.7–5.3)
Sodium: 144 mEq/L (ref 137–147)

## 2013-11-02 LAB — GLUCOSE, CAPILLARY
GLUCOSE-CAPILLARY: 81 mg/dL (ref 70–99)
GLUCOSE-CAPILLARY: 84 mg/dL (ref 70–99)
Glucose-Capillary: 72 mg/dL (ref 70–99)

## 2013-11-02 LAB — PRO B NATRIURETIC PEPTIDE: PRO B NATRI PEPTIDE: 2403 pg/mL — AB (ref 0–125)

## 2013-11-02 MED ORDER — FUROSEMIDE 10 MG/ML IJ SOLN: 40.0000 mg | Freq: Four times a day (QID) | INTRAMUSCULAR | Status: DC

## 2013-11-02 MED ORDER — FUROSEMIDE 10 MG/ML IJ SOLN
100.0000 mg | Freq: Once | INTRAMUSCULAR | Status: AC
  Administered 2013-11-02: 100 mg via INTRAVENOUS
  Filled 2013-11-02: qty 10

## 2013-11-02 MED ORDER — NITROGLYCERIN 2 % TD OINT
1.0000 [in_us] | TOPICAL_OINTMENT | Freq: Once | TRANSDERMAL | Status: AC
  Administered 2013-11-02: 1 [in_us] via TOPICAL
  Filled 2013-11-02: qty 1

## 2013-11-02 MED ORDER — INSULIN GLARGINE 100 UNIT/ML ~~LOC~~ SOLN
25.0000 [IU] | Freq: Every day | SUBCUTANEOUS | Status: DC
  Administered 2013-11-03 – 2013-11-07 (×5): 25 [IU] via SUBCUTANEOUS
  Filled 2013-11-02 (×5): qty 0.25

## 2013-11-02 NOTE — ED Notes (Signed)
Pt arrived from [facility] for sob. Reports sob, abd pain, swelling to abd and legs x 3 days. Airway is intact, hx of chf. ekg done at triage.

## 2013-11-02 NOTE — ED Notes (Signed)
Glucose of 44 called from lab

## 2013-11-02 NOTE — ED Provider Notes (Signed)
CSN: [id number]     Arrival date & time 11/02/13  1418 History   First MD Initiated Contact with Patient 11/02/13 1714     Chief Complaint  Patient presents with  . Shortness of Breath   (Consider location/radiation/quality/duration/timing/severity/associated sxs/prior Treatment) HPI  This a 48 year old female with history of diabetes, diastolic CHF, morbid obesity who presents with shortness of breath, leg swelling, and multiple episodes of hypoglycemia. Patient states that she has had worsening lower extremity edema and shortness of breath her last several days. She endorses orthopnea. Patient also reports abdominal swelling and pain. She denies any nausea, vomiting, or diarrhea. Patient reports multiple episodes of home of hypoglycemia resulting in syncope. She's not been seen or evaluated for this. She's not currently having her insulin managed because she's not been able to get to her primary care Dr.  Patient was noted to have a blood glucose of 45 on her triage BNP. Patient was given food and repeat CBG is within normal limits.  Past Medical History  Diagnosis Date  . Insulin dependent type 2 diabetes mellitus, uncontrolled   . Chronic diastolic CHF (congestive heart failure)   . Morbid obesity with BMI of 50.0-59.9, adult   . Bipolar disease, chronic   . History of thyrotoxicosis   . OSA (obstructive sleep apnea) 03/06/2011  . Chest pain     a. 2012 Myoview: EF 63%, no isch/infarct  . Chronic diastolic congestive heart failure 07/23/2011  . Hypertensive heart disease 10/18/2013   History reviewed. No pertinent past surgical history. Family History  Problem Relation Age of Onset  . Heart failure Father   . Stroke Father   . Heart disease Maternal Grandfather   . Heart disease Paternal Grandfather   . Hypertension Mother    History  Substance Use Topics  . Smoking status: Never Smoker   . Smokeless tobacco: Never Used  . Alcohol Use: No   OB History   Grav Para Term Preterm  Abortions TAB SAB Ect Mult Living                 Review of Systems  Constitutional: Negative for fever.  Respiratory: Positive for shortness of breath. Negative for cough and chest tightness.   Cardiovascular: Positive for leg swelling. Negative for chest pain.  Gastrointestinal: Positive for abdominal pain. Negative for nausea and vomiting.  Genitourinary: Negative for dysuria.  Musculoskeletal: Negative for back pain.  Skin: Negative for rash.  Neurological: Negative for headaches.  Psychiatric/Behavioral: Negative for confusion.  All other systems reviewed and are negative.    Allergies  Acetaminophen; Caffeine; and Lisinopril  Home Medications   Current Outpatient Rx  Name  Route  Sig  Dispense  Refill  . albuterol (PROVENTIL HFA;VENTOLIN HFA) 108 (90 BASE) MCG/ACT inhaler   Inhalation   Inhale 1-2 puffs into the lungs every 6 (six) hours as needed for wheezing or shortness of breath.   1 Inhaler   3   . fosinopril (MONOPRIL) 40 MG tablet   Oral   Take 1 tablet (40 mg total) by mouth daily.   30 tablet   5   . furosemide (LASIX) 20 MG tablet      Take 60 mg every morning, 40 mg every afternoon         . insulin aspart (NOVOLOG) 100 UNIT/ML FlexPen   Subcutaneous   Inject 15 Units into the skin 3 (three) times daily with meals. Patient uses sliding scale. Starts at 15 units with cbg over 120         .  insulin glargine (LANTUS) 100 UNIT/ML injection   Subcutaneous   Inject 0.55 mLs (55 Units total) into the skin at bedtime.   5 pen   12   . metFORMIN (GLUCOPHAGE-XR) 500 MG 24 hr tablet   Oral   Take 1,000 mg by mouth 2 (two) times daily.          . metoprolol succinate (TOPROL-XL) 50 MG 24 hr tablet   Oral   Take 1 tablet (50 mg total) by mouth daily. Take with or immediately following a meal.   90 tablet   3   . potassium chloride (KLOR-CON) 10 MEQ CR tablet   Oral   Take 4 tablets (40 mEq total) by mouth daily.   120 tablet   3   .  rosuvastatin (CRESTOR) 10 MG tablet   Oral   Take 1 tablet (10 mg total) by mouth daily.   90 tablet   3   . sertraline (ZOLOFT) 50 MG tablet   Oral   Take 50 mg by mouth daily.         . traZODone (DESYREL) 50 MG tablet   Oral   Take 50 mg by mouth at bedtime.          BP 171/98  Pulse 57  Temp(Src) 98.1 F (36.7 C) (Oral)  Resp 28  Wt 359 lb 14.4 oz (163.25 kg)  SpO2 100% Physical Exam  Nursing note and vitals reviewed. Constitutional: She is oriented to person, place, and time. No distress.  Obese, tachypnea  HENT:  Head: Normocephalic and atraumatic.  Mouth/Throat: Oropharynx is clear and moist.  Eyes: Pupils are equal, round, and reactive to light.  Neck: Neck supple. JVD present.  Cardiovascular: Normal rate, regular rhythm and normal heart sounds.   Pulmonary/Chest: Effort normal. No respiratory distress. She has no wheezes.  Distant breath sounds  Abdominal: Soft. Bowel sounds are normal. She exhibits no distension. There is tenderness. There is no rebound and no guarding.  Anasarca noted  Musculoskeletal: She exhibits edema.  3+ edema bilateral lower extremities  Neurological: She is alert and oriented to person, place, and time.  Skin: Skin is warm and dry.  Psychiatric: She has a normal mood and affect.    ED Course  Procedures (including critical care time) Labs Review Labs Reviewed  CBC - Abnormal; Notable for the following:    Hemoglobin 11.5 (*)    RDW 16.8 (*)    Platelets 487 (*)    All other components within normal limits  BASIC METABOLIC PANEL - Abnormal; Notable for the following:    Glucose, Bld 44 (*)    GFR calc non Af Amer 78 (*)    All other components within normal limits  PRO B NATRIURETIC PEPTIDE - Abnormal; Notable for the following:    Pro B Natriuretic peptide (BNP) 2403.0 (*)    All other components within normal limits  HEPATIC FUNCTION PANEL - Abnormal; Notable for the following:    Albumin 2.7 (*)    Alkaline  Phosphatase 127 (*)    All other components within normal limits  GLUCOSE, CAPILLARY  GLUCOSE, CAPILLARY  BASIC METABOLIC PANEL  TSH  POCT I-STAT TROPONIN I   Imaging Review Dg Chest 2 View  11/02/2013   CLINICAL DATA:  Dyspnea and peripheral edema.  EXAM: CHEST  2 VIEW  COMPARISON:  DG CHEST 2 VIEW dated 08/30/2013  FINDINGS: The lungs are well-expanded. The interstitial markings are mildly prominent but less conspicuous than on the previous  study. The cardiopericardial silhouette remains enlarged. The pulmonary vascularity is engorged and there is cephalization of the pulmonary vascular pattern. There is no pleural effusion. The trachea is midline. The bony structures exhibit no acute abnormalities.  IMPRESSION: The findings are consistent with congestive heart failure with mild pulmonary interstitial edema.   Electronically Signed   By: David  Martinique   On: 11/02/2013 15:16    EKG Interpretation    Date/Time:  Thursday November 02 2013 14:38:26 EST Ventricular Rate:  53 PR Interval:  158 QRS Duration: 80 QT Interval:  456 QTC Calculation: 427 R Axis:   122 Text Interpretation:  Sinus bradycardia Biatrial enlargement Right axis deviation T wave inversions in lead III Abnormal ECG Confirmed by Meriah Shands  MD, Rajanae Mantia ([id number]) on 11/02/2013 5:58:42 PM            MDM   1. CHF (congestive heart failure)   2. Hypoglycemia    Patient presents with shortness of breath, leg swelling, and hypoglycemia. She has tachypnea on exam but does not hypoxic. Noted to have bilateral lower extremity edema and anasarca. I reviewed the patient's chart and she is currently on 100 mg of Lasix daily. Workup notable for elevated BNP and evidence of pulmonary edema on chest x-ray. We'll give the patient 100 mg IV Lasix for presumed congestive heart failure. Patient also noted to be hyperglycemic. She has not eaten anything today but has taken her insulin. Spoke with Dr. Wynonia Lawman from cardiology who will help manage  the patient's cardiac issues. She will be admitted to family medicine service.   Merryl Hacker, MD 11/02/13 [phone]

## 2013-11-02 NOTE — Consult Note (Addendum)
Cardiology Consult Note  Admit date: 11/02/2013 Name: [name] 48 y.o.  female DOB:  Jun 24, 1966 MRN:  [id number]  Today's date:  11/02/2013  Referring Physician:   [facility]  Primary Physician:    None  Reason for Consultation:    Congestive heart failure and shortness of breath  IMPRESSIONS: 1. Acute on chronic diastolic congestive heart failure 2. Poorly controlled hypertension 3. Morbid obesity 4. History of bipolar disorder with depression and psychosis 5. Insulin-dependent diabetes mellitus with hypoglycemia 6. Hypertensive heart disease with congestive heart failure 7. Chronic diastolic heart failure 8. Sleep apnea 9. History of thyrotoxicosis in chart  RECOMMENDATION: 1. Admission to medical service to help with hypoglycemia and other medical issues 2. Intravenous diuresis and control of blood pressure  3. Consider psychiatric consult 4. Evaluate thyroid function  HISTORY: This 48 year old morbidly obese [place] female presents to the emergency room stating that she wants her blood sugar checked. She also is having dyspnea and evidently went to the outpatient clinic today but did not have her card and was turned away. She then presented to the emergency room where she was found to have anasarca and significant heart failure. She previously had been on torsemide and was recently changed to furosemide. She has a history of bipolar disorder and has been hospitalized in the past and has had issues with poorly controlled hypertension as well as noncompliance with her medical regimen in the past. She states that she has some mild abdominal pain. She speaks is somewhat halting sentences and there is no family available today. She complains of shortness of breath as well as chronic lower extremity swelling. She has been morbidly obese for years. She has sleep apnea and is supposed to wear CPAP it is difficult to tell whether she is compliant with that or not. She does  have orthopnea and may have some PND. She does not get any exercise. She has a history of atypical chest pain and has had a negative myocardial perfusion scan in the past. Echocardiogram done recently in November shows significant pulmonary hypertension, grade 2 diastolic dysfunction and an ejection fraction around 55%. She had a myocardial perfusion scan done earlier this year that showed an ejection fraction of 71% and was normal.  Past Medical History  Diagnosis Date  . Insulin dependent type 2 diabetes mellitus, uncontrolled   . Chronic diastolic CHF (congestive heart failure)   . Morbid obesity with BMI of 50.0-59.9, adult   . Bipolar disease, chronic   . History of thyrotoxicosis   . OSA (obstructive sleep apnea) 03/06/2011  . Chest pain     a. 2014 Myoview: EF 71%, no isch/infarct  . Chronic diastolic congestive heart failure 07/23/2011  . Hypertensive heart disease 10/18/2013      History reviewed. No pertinent past surgical history.   Allergies:  is allergic to acetaminophen; caffeine; and lisinopril.   Medications: Prior to Admission medications   Medication Sig Start Date End Date Taking? Authorizing Provider  albuterol (PROVENTIL HFA;VENTOLIN HFA) 108 (90 BASE) MCG/ACT inhaler Inhale 1-2 puffs into the lungs every 6 (six) hours as needed for wheezing or shortness of breath. 09/07/13  Yes Ripudeep Krystal Eaton, MD  fosinopril (MONOPRIL) 40 MG tablet Take 1 tablet (40 mg total) by mouth daily. 09/07/13  Yes Ripudeep Krystal Eaton, MD  furosemide (LASIX) 20 MG tablet Take 60 mg every morning, 40 mg every afternoon 10/20/13  Yes Scott T Weaver, PA-C  insulin aspart (NOVOLOG) 100 UNIT/ML FlexPen Inject 15  Units into the skin 3 (three) times daily with meals. Patient uses sliding scale. Starts at 15 units with cbg over 120   Yes Historical Provider, MD  insulin glargine (LANTUS) 100 UNIT/ML injection Inject 0.55 mLs (55 Units total) into the skin at bedtime. 09/07/13  Yes Ripudeep Krystal Eaton, MD   metFORMIN (GLUCOPHAGE-XR) 500 MG 24 hr tablet Take 1,000 mg by mouth 2 (two) times daily.  06/01/13  Yes Ripudeep Krystal Eaton, MD  metoprolol succinate (TOPROL-XL) 50 MG 24 hr tablet Take 1 tablet (50 mg total) by mouth daily. Take with or immediately following a meal. 09/22/13  Yes Lelon Perla, MD  potassium chloride (KLOR-CON) 10 MEQ CR tablet Take 4 tablets (40 mEq total) by mouth daily. 08/22/12  Yes Clanford Marisa Hua, MD  rosuvastatin (CRESTOR) 10 MG tablet Take 1 tablet (10 mg total) by mouth daily. 06/01/13  Yes Ripudeep Krystal Eaton, MD  sertraline (ZOLOFT) 50 MG tablet Take 50 mg by mouth daily. 09/26/13  Yes Historical Provider, MD  traZODone (DESYREL) 50 MG tablet Take 50 mg by mouth at bedtime. 09/26/13  Yes Historical Provider, MD    Family History: Family Status  Relation Status Death Age  . Father Deceased 69    Died from stroke. Also had heart failure  . Mother Alive     HTN  . Brother      HTN    Social History:   reports that she has never smoked. She has never used smokeless tobacco. She reports that she does not drink alcohol or use illicit drugs.   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 But ran out of money and is no longer attending school. (Note patient has bipolar disorder).      Born in [place] but lived in [place] before coming back to [place].       Primary language is Arabic. Lives with mother and brother.                Review of Systems: Morbidly obese for many years. She complains that her abdomen is tender. She states that her joints hurt. Chronic dyspnea. She states that she has been compliant with her medications previously. Has not had any recent internal medicine followup. Is not compliant with diet at home.  Other than as noted above the remainder of the review of systems is unremarkable.  Physical Exam: BP 171/98  Pulse 57  Temp(Src) 98.1 F  (36.7 C) (Oral)  Resp 28  Wt 163.25 kg (359 lb 14.4 oz)  SpO2 100%  General appearance: Very large morbidly obese black female who speaks and halting sentences and appears to have tardiveI dyskinesias Head: Normocephalic, without obvious abnormality, atraumatic Eyes: conjunctivae/corneas clear. PERRL, EOM's intact. Fundi not examined Neck: no adenopathy, no carotid bruit, supple, symmetrical, trachea midline and JVD extremely difficult to assess Lungs: Reduced breath sounds, faint crackles at bases Heart: Regular rhythm, distant heart sounds, normal S1-S2, no S3 Abdomen: Very large and soft nontender Pelvic: deferred Extremities: 3+ peripheral edema, some anasarca Pulses: 2+ and symmetric Skin: Skin color, texture, turgor normal. No rashes or lesions Neurologic: Grossly normal    Labs: CBC  Recent Labs  11/02/13 1443  WBC 7.5  RBC 4.28  HGB 11.5*  HCT 36.4  PLT 487*  MCV 85.0  MCH 26.9  MCHC 31.6  RDW 16.8*  CMP   Recent Labs  11/02/13 1443  NA 144  K 3.7  CL 106  CO2 26  GLUCOSE 44*  BUN 16  CREATININE 0.88  CALCIUM 8.8  GFRNONAA 78*  GFRAA >90   BNP (last 3 results)  Recent Labs  10/18/13 1623 10/27/13 1316 11/02/13 1443  PROBNP 912.0* 945.0* 2403.0*   Cardiac Panel (last 3 results) Troponin (Point of Care Test)  Recent Labs  11/02/13 1559  TROPIPOC 0.02     Radiology: Cardiomegaly with interstitial pulmonary congestion  EKG: Sinus rhythm, right axis deviation  Signed:  W. Doristine Church MD [facility]   Cardiology Consultant  11/02/2013, 7:24 PM

## 2013-11-02 NOTE — ED Provider Notes (Signed)
CSN: [id number]     Arrival date & time 11/02/13  1118 History   None    Chief Complaint  Patient presents with  . Shortness of Breath   (Consider location/radiation/quality/duration/timing/severity/associated sxs/prior Treatment) HPI Comments: 48 year old female with a history of type 2 diabetes, congestive heart failure, hypertension, hypothyroidism, bipolar disorder presents for evaluation of shortness of breath, bilateral leg swelling, and for management of her diabetes. She has had persistent hypoglycemia despite stopping her NovoLog for the past 3 months. She stopped NovoLog one month ago, but her blood sugar has remained consistently low. Also, for the past few days she has had increasing shortness of breath bilateral leg swelling and pain, as well as abdominal pain and tightness. She has no significant history of lower extremity edema that she can recall. She denies chest pain, NVD.    Past Medical History  Diagnosis Date  . Insulin dependent type 2 diabetes mellitus, uncontrolled     Uncontrolled, sugar typically 240.   Marland Kitchen Hypertension   . Chronic diastolic CHF (congestive heart failure)     a. 04/2009 Echo: EF 60-65%, nl wall motion, restrictive physiology & decreased diast compliance, Mild MR, mod dil  LA, PASP 78mmHg.;  b. 01/2011 Echo: Nl LV, Gr 2 DD, biatrial enlargement.  Marland Kitchen Psychosis   . Morbid obesity with BMI of 50.0-59.9, adult     Ht: 5\' 7"  Wt: 364 lbs. BMI: 57.0  . ANEMIA NOS   . ANXIETY DISORDER, GENERALIZED   . DEGENERATIVE JOINT DISEASE, KNEE   . DISORDER, BIPOLAR NOS   . HYPOTHYROIDISM NOS   . THYROTOXICOSIS   . OSA (obstructive sleep apnea) 03/06/2011  . Chest pain     a. 2012 Myoview: EF 63%, no isch/infarct   History reviewed. No pertinent past surgical history. Family History  Problem Relation Age of Onset  . Heart failure Father   . Stroke Father   . Heart disease Maternal Grandfather   . Heart disease Paternal Grandfather   . Hypertension Mother     History  Substance Use Topics  . Smoking status: Never Smoker   . Smokeless tobacco: Never Used  . Alcohol Use: No   OB History   Grav Para Term Preterm Abortions TAB SAB Ect Mult Living                 Review of Systems  Constitutional: Negative for fever and chills.  Eyes: Negative for visual disturbance.  Respiratory: Positive for cough and shortness of breath.   Cardiovascular: Positive for leg swelling. Negative for chest pain and palpitations.  Gastrointestinal: Positive for abdominal pain. Negative for nausea and vomiting.  Endocrine: Negative for polydipsia and polyuria.       Hypoglycemia  Genitourinary: Negative for dysuria, urgency and frequency.  Musculoskeletal: Negative for arthralgias and myalgias.  Skin: Negative for rash.  Neurological: Negative for dizziness, weakness and light-headedness.    Allergies  Acetaminophen; Caffeine; and Lisinopril  Home Medications   Current Outpatient Rx  Name  Route  Sig  Dispense  Refill  . albuterol (PROVENTIL HFA;VENTOLIN HFA) 108 (90 BASE) MCG/ACT inhaler   Inhalation   Inhale 1-2 puffs into the lungs every 6 (six) hours as needed for wheezing or shortness of breath.   1 Inhaler   3   . fosinopril (MONOPRIL) 40 MG tablet   Oral   Take 1 tablet (40 mg total) by mouth daily.   30 tablet   5   . furosemide (LASIX) 20 MG tablet  Take 60 mg every morning, 40 mg every afternoon         . insulin glargine (LANTUS) 100 UNIT/ML injection   Subcutaneous   Inject 0.55 mLs (55 Units total) into the skin at bedtime.   5 pen   12   . metFORMIN (GLUCOPHAGE-XR) 500 MG 24 hr tablet   Oral   Take 1,000 mg by mouth daily with breakfast.         . metoprolol succinate (TOPROL-XL) 50 MG 24 hr tablet   Oral   Take 1 tablet (50 mg total) by mouth daily. Take with or immediately following a meal.   90 tablet   3   . potassium chloride (KLOR-CON) 10 MEQ CR tablet   Oral   Take 4 tablets (40 mEq total) by mouth  daily.   120 tablet   3   . rosuvastatin (CRESTOR) 10 MG tablet   Oral   Take 1 tablet (10 mg total) by mouth daily.   90 tablet   3   . sertraline (ZOLOFT) 50 MG tablet   Oral   Take 50 mg by mouth daily.         . traZODone (DESYREL) 50 MG tablet   Oral   Take 50 mg by mouth at bedtime.          BP 204/102  Pulse 64  Temp(Src) 97.6 F (36.4 C) (Oral)  Resp 22  SpO2 94% Physical Exam  Nursing note and vitals reviewed. Constitutional: She is oriented to person, place, and time. Vital signs are normal. She appears well-developed and well-nourished. No distress.  Obese habitus   HENT:  Head: Normocephalic and atraumatic.  Cardiovascular:  Cardiopulmonary exam is very difficult due to body habitus, heart and lung sounds soft and very distant   BLE edema, 2+ up to knees   Pulmonary/Chest: No accessory muscle usage. Tachypnea noted. No respiratory distress. She has decreased breath sounds.  Abdominal:  Protuberant abdomen  Neurological: She is alert and oriented to person, place, and time. She has normal strength. Coordination normal.  Skin: Skin is warm and dry. No rash noted. She is not diaphoretic.  Psychiatric: Judgment normal. Her affect is inappropriate.    ED Course  Procedures (including critical care time) Labs Review Labs Reviewed  GLUCOSE, CAPILLARY   Imaging Review No results found.    MDM   1. CHF exacerbation    This patient has evidence of exacerbation of her congestive heart failure. I believe she would benefit from IV diuretics considering that she is starting on fairly high doses of oral Lasix. Without relief. She also complains of abdominal swelling, pending the differential to include cirrhosis. She is being transferred to the emergency department via shuttle.   Liam Graham, PA-C 11/02/13 1351

## 2013-11-02 NOTE — H&P (Signed)
[facility] Admission History and Physical Service Pager: [phone]  Patient name: [name] Medical record number: [id number] Date of birth: Dec 22, 1965 Age: 48 y.o. Gender: female  Primary Care Provider: No PCP Per Patient Consultants: Cardiology Code Status: Full  Chief Complaint: leg swelling, low blood sugar  Assessment and Plan: [name] is a 48 y.o. female presenting with hypoglycemia, bilateral leg swelling concerning for CHF exacerbation. PMH is significant for CHF HFpEF (EF 55-60%), DM, HTN, morbid obesity, Bipolar disorder, anxiety, OSA, hx of thyrotoxicosis.  # Acute exacerbation of chronic Diastolic CHF: last echo 14/7829 with EF55-60% and grade 2 diastolic dysfunction. Lung exam clear but bilateral leg edema and anasarca. CXR with mild pulmonary interstitial edema. EKG with right atrial enlargement. Followed by Dr. [name] as outpatient. Has no O2 requirement currently, sat 100% on RA. Given IV lasix 100mg  in ED and nitro patch. Additional labs: Bmet wnl (except for glucose 44), BNP 2403 (6 days ago 945), WBC 7.5, Hgb 11.5, i-stat troponin 0.02, albumin 2.7 (baseline 3), normal AST/ALT.  - admit to tele, attending Dr. Lindell Noe - IV lasix 120mg  q12hr - additional labs: A1c, lipid panel, serial troponins - appreciate cardiology consult and recommendations in the care of this patient - will continue home toprol XL 50mg , fosinopril 40mg  - measure weights daily - strict I/Os - may need change in OP meds given lack of insurance.  Will address in AM.   # Hypoglycemia / DM: has not been followed for DM as outpatient in past few months. history of low blood sugar despite stopping novolog, still on lantus 55U at home. This morning with CBG 40-50, and 44 on BMet in ED; given some sugar/food in ED with repeat CBGs up to 80.  - lantus 25 units - change to QAM dosing; may need divided dosing as OP - novolog SSI - diet carb mod  # Burning urination: will  get UA to check for infection  # HTN: BPs elevated in ED to 166-171/98-109, likely worsened by fluid overload.  - continue home fosinoporil; hold home Toprol XL given bradycardia throughout hospitalization (BP improved after initial diuresis) - consider change to Coreg  # OSA: prescribed CPAP but does not use it. Recent echo with pulm art pressure 30mm. - will not order CPAP during admission - needs to be readdressed as OP  # Psych: history of bipolar disorder and anxiety.  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  Speech dyspraxia noted to be worse when anxiety. - continue home zoloft 50mg  - consider psych and/or ST consult  FEN/GI: saline lock, diet carb mod Prophylaxis: heparin  Disposition: admit to tele  History of Present Illness: Patient is from [place] but declines interpreter. Speaks English fairly well but does have a speech impediment that she says has been present since she was little.  [name] is a 48 y.o. female presenting with bilateral leg swelling, low blood sugar, and abdominal pain, went to Urgent Care today for these issues. Patient has a speech impediment at baseline. She has been taking all of her medications as prescribed and has not missed any lasix. At baseline she uses 2 pillows at night, can sleep laying flat sometimes, has not been using her CPAP for past 6 months. Swelling started a few days ago. She does not have any CP, has SOB when walking but does not complain of SOB while in the bed in ED. She says it has been burning when she pees and thinks she isn't peeing  as much recently. Abdominal pain is in lower abdomen, "different" than her period which she is on right now and also has cramping pain from that (says every 3 months she has a longer period). This morning she checked her blood sugar and it was around 40-50, she says she stopped using novolog but has been using 55 units lantus. She has a history of low blood sugars that she has passed out from  where EMS is called, but not taken to the hospital (most recent episode was 1 month ago). She denies any cough, fever, chills, nausea/vomiting, feeling sick or muscle aches.  Review Of Systems: Per HPI with the following additions: none Otherwise 12 point review of systems was performed and was unremarkable.  Patient Active Problem List   Diagnosis Date Noted  . Acute on chronic diastolic congestive heart failure 11/02/2013  . Hypertensive heart disease 10/18/2013  . IDDM (insulin dependent diabetes mellitus) 10/18/2013  . Chronic diastolic congestive heart failure 07/23/2011  . OSA (obstructive sleep apnea) 03/06/2011  . Morbid obesity 02/19/2011  . Bipolar disorder    Past Medical History: Past Medical History  Diagnosis Date  . Insulin dependent type 2 diabetes mellitus, uncontrolled   . Chronic diastolic CHF (congestive heart failure)   . Morbid obesity with BMI of 50.0-59.9, adult   . Bipolar disease, chronic   . History of thyrotoxicosis   . OSA (obstructive sleep apnea) 03/06/2011  . Chest pain     a. 2012 Myoview: EF 63%, no isch/infarct  . Chronic diastolic congestive heart failure 07/23/2011  . Hypertensive heart disease 10/18/2013   Past Surgical History: History reviewed. No pertinent past surgical history. Social History: History  Substance Use Topics  . Smoking status: Never Smoker   . Smokeless tobacco: Never Used  . Alcohol Use: No   Additional social history: lives with mom at home  Please also refer to relevant sections of EMR.  Family History: Family History  Problem Relation Age of Onset  . Heart failure Father   . Stroke Father   . Heart disease Maternal Grandfather   . Heart disease Paternal Grandfather   . Hypertension Mother    Allergies and Medications: Allergies  Allergen Reactions  . Acetaminophen     Fits as a child "seizures-like"  . Caffeine     Tense, anxiety, increased urination  . Lisinopril     Rash with lisinopril; but  fosinopril is ok per patient   No current facility-administered medications on file prior to encounter.   Current Outpatient Prescriptions on File Prior to Encounter  Medication Sig Dispense Refill  . albuterol (PROVENTIL HFA;VENTOLIN HFA) 108 (90 BASE) MCG/ACT inhaler Inhale 1-2 puffs into the lungs every 6 (six) hours as needed for wheezing or shortness of breath.  1 Inhaler  3  . fosinopril (MONOPRIL) 40 MG tablet Take 1 tablet (40 mg total) by mouth daily.  30 tablet  5  . furosemide (LASIX) 20 MG tablet Take 60 mg every morning, 40 mg every afternoon      . insulin glargine (LANTUS) 100 UNIT/ML injection Inject 0.55 mLs (55 Units total) into the skin at bedtime.  5 pen  12  . metFORMIN (GLUCOPHAGE-XR) 500 MG 24 hr tablet Take 1,000 mg by mouth 2 (two) times daily.       . metoprolol succinate (TOPROL-XL) 50 MG 24 hr tablet Take 1 tablet (50 mg total) by mouth daily. Take with or immediately following a meal.  90 tablet  3  .  potassium chloride (KLOR-CON) 10 MEQ CR tablet Take 4 tablets (40 mEq total) by mouth daily.  120 tablet  3  . rosuvastatin (CRESTOR) 10 MG tablet Take 1 tablet (10 mg total) by mouth daily.  90 tablet  3  . sertraline (ZOLOFT) 50 MG tablet Take 50 mg by mouth daily.      . traZODone (DESYREL) 50 MG tablet Take 50 mg by mouth at bedtime.        Objective: BP 171/98  Pulse 57  Temp(Src) 98.1 F (36.7 C) (Oral)  Resp 28  Wt 359 lb 14.4 oz (163.25 kg)  SpO2 100% Exam: General: NAD, sitting upright in bed with [place] present but O2 off HEENT: PERRL, EOMI, MMM. Cardiovascular: RRR, distant heart sounds but normal s1/s2, no murmurs. No JVD. 2+ PT and radial pulses bilaterally Respiratory: CTAB and normal effort, no w/r/c appreciated Abdomen: obese, soft, nontender/nondistended but does have 3+ pitting edema in lower abdomen. Bowel sounds present Extremities: 3+ pitting edema up to knees bilaterally. WWP Skin: warm and dry Neuro: A+O, no focal deficits. Speech is  dysarthric with word finding difficulty intermittently.  Will intermittently & spontaneously produce coherent and clear/fluid sentences.   Labs and Imaging: CBC BMET   Recent Labs Lab 11/02/13 1443  WBC 7.5  HGB 11.5*  HCT 36.4  PLT 487*    Recent Labs Lab 11/02/13 1443  NA 144  K 3.7  CL 106  CO2 26  BUN 16  CREATININE 0.88  GLUCOSE 44*  CALCIUM 8.8     CXR 2v: FINDINGS:  The lungs are well-expanded. The interstitial markings are mildly  prominent but less conspicuous than on the previous study. The  cardiopericardial silhouette remains enlarged. The pulmonary  vascularity is engorged and there is cephalization of the pulmonary  vascular pattern. There is no pleural effusion. The trachea is  midline. The bony structures exhibit no acute abnormalities.  IMPRESSION:  The findings are consistent with congestive heart failure with mild  pulmonary interstitial edema.   Tawanna Sat, MD 11/02/2013, 8:12 PM PGY-1, [place] Intern pager: [phone], text pages welcome

## 2013-11-02 NOTE — ED Notes (Signed)
CBG 84. 

## 2013-11-02 NOTE — ED Provider Notes (Signed)
Medical screening examination/treatment/procedure(s) were performed by non-physician practitioner and as supervising physician I was immediately available for consultation/collaboration.  Philipp Deputy, M.D.  Harden Mo, MD 11/02/13 Drema Halon

## 2013-11-02 NOTE — ED Notes (Signed)
Pt  Reports   Shortness  Of  Breath   With  Edema  Of  Extremities        With   The  Symptoms  For   A  While   Pt       Cap  Refill  Is  Delayed              denys  Any  Chest  Pain  Pt  Reports  Her   Sugar  Has  Been  Low  recentty  As  Well

## 2013-11-03 ENCOUNTER — Encounter (HOSPITAL_COMMUNITY): Payer: Self-pay | Admitting: Family Medicine

## 2013-11-03 DIAGNOSIS — Z91199 Patient's noncompliance with other medical treatment and regimen due to unspecified reason: Secondary | ICD-10-CM

## 2013-11-03 DIAGNOSIS — I272 Pulmonary hypertension, unspecified: Secondary | ICD-10-CM | POA: Diagnosis present

## 2013-11-03 DIAGNOSIS — E119 Type 2 diabetes mellitus without complications: Secondary | ICD-10-CM

## 2013-11-03 DIAGNOSIS — Z9119 Patient's noncompliance with other medical treatment and regimen: Secondary | ICD-10-CM

## 2013-11-03 DIAGNOSIS — I509 Heart failure, unspecified: Secondary | ICD-10-CM

## 2013-11-03 DIAGNOSIS — Z794 Long term (current) use of insulin: Secondary | ICD-10-CM

## 2013-11-03 DIAGNOSIS — F319 Bipolar disorder, unspecified: Secondary | ICD-10-CM

## 2013-11-03 HISTORY — DX: Pulmonary hypertension, unspecified: I27.20

## 2013-11-03 LAB — BASIC METABOLIC PANEL
BUN: 14 mg/dL (ref 6–23)
BUN: 15 mg/dL (ref 6–23)
CALCIUM: 8.5 mg/dL (ref 8.4–10.5)
CALCIUM: 8.7 mg/dL (ref 8.4–10.5)
CO2: 30 mEq/L (ref 19–32)
CO2: 25 mEq/L (ref 19–32)
Chloride: 103 mEq/L (ref 96–112)
Chloride: 100 mEq/L (ref 96–112)
Creatinine, Ser: 0.9 mg/dL (ref 0.50–1.10)
Creatinine, Ser: 0.84 mg/dL (ref 0.50–1.10)
GFR calc Af Amer: 88 mL/min — ABNORMAL LOW (ref >90)
GFR, EST NON AFRICAN AMERICAN: 76 mL/min — AB (ref >90)
GFR, EST NON AFRICAN AMERICAN: 83 mL/min — AB (ref >90)
GLUCOSE: 97 mg/dL (ref 70–99)
Glucose, Bld: 136 mg/dL — ABNORMAL HIGH (ref 70–99)
POTASSIUM: 3.6 meq/L — AB (ref 3.7–5.3)
Potassium: 3.4 mEq/L — ABNORMAL LOW (ref 3.7–5.3)
SODIUM: 146 meq/L (ref 137–147)
SODIUM: 143 meq/L (ref 137–147)

## 2013-11-03 LAB — GLUCOSE, CAPILLARY
GLUCOSE-CAPILLARY: 128 mg/dL — AB (ref 70–99)
GLUCOSE-CAPILLARY: 170 mg/dL — AB (ref 70–99)
GLUCOSE-CAPILLARY: 169 mg/dL — AB (ref 70–99)
Glucose-Capillary: 100 mg/dL — ABNORMAL HIGH (ref 70–99)
Glucose-Capillary: 121 mg/dL — ABNORMAL HIGH (ref 70–99)

## 2013-11-03 LAB — CBC
HEMATOCRIT: 34.8 % — AB (ref 36.0–46.0)
HEMOGLOBIN: 10.9 g/dL — AB (ref 12.0–15.0)
MCH: 26.7 pg (ref 26.0–34.0)
MCHC: 31.3 g/dL (ref 30.0–36.0)
MCV: 85.3 fL (ref 78.0–100.0)
Platelets: 413 10*3/uL — ABNORMAL HIGH (ref 150–400)
RBC: 4.08 MIL/uL (ref 3.87–5.11)
RDW: 16.9 % — ABNORMAL HIGH (ref 11.5–15.5)
WBC: 7 10*3/uL (ref 4.0–10.5)

## 2013-11-03 LAB — LIPID PANEL
CHOL/HDL RATIO: 2.6 ratio
CHOLESTEROL: 66 mg/dL (ref 0–200)
HDL: 25 mg/dL — ABNORMAL LOW (ref >39)
LDL Cholesterol: 26 mg/dL (ref 0–99)
Triglycerides: 76 mg/dL (ref <150)
VLDL: 15 mg/dL (ref 0–40)

## 2013-11-03 LAB — HEMOGLOBIN A1C
HEMOGLOBIN A1C: 7.7 % — AB (ref <5.7)
MEAN PLASMA GLUCOSE: 174 mg/dL — AB (ref <117)

## 2013-11-03 LAB — URINALYSIS, ROUTINE W REFLEX MICROSCOPIC
Glucose, UA: NEGATIVE mg/dL
Ketones, ur: 15 mg/dL — AB
NITRITE: NEGATIVE
PROTEIN: 100 mg/dL — AB
SPECIFIC GRAVITY, URINE: 1.022 (ref 1.005–1.030)
Urobilinogen, UA: 0.2 mg/dL (ref 0.0–1.0)
pH: 5 (ref 5.0–8.0)

## 2013-11-03 LAB — TROPONIN I
Troponin I: <0.3 ng/mL (ref <0.30)
Troponin I: <0.3 ng/mL (ref <0.30)

## 2013-11-03 LAB — URINE MICROSCOPIC-ADD ON

## 2013-11-03 LAB — TSH: TSH: 4.36 u[IU]/mL (ref 0.350–4.500)

## 2013-11-03 MED ORDER — ONDANSETRON HCL 4 MG/2ML IJ SOLN: 4.0000 mg | Freq: Four times a day (QID) | INTRAMUSCULAR | Status: DC | PRN

## 2013-11-03 MED ORDER — TRAZODONE HCL 50 MG PO TABS
50.0000 mg | ORAL_TABLET | Freq: Every day | ORAL | Status: DC
  Filled 2013-11-03: qty 1

## 2013-11-03 MED ORDER — LORAZEPAM 1 MG PO TABS
1.0000 mg | ORAL_TABLET | Freq: Four times a day (QID) | ORAL | Status: DC | PRN
  Administered 2013-11-03 – 2013-11-05 (×5): 1 mg via ORAL
  Filled 2013-11-03 (×6): qty 1

## 2013-11-03 MED ORDER — SERTRALINE HCL 50 MG PO TABS
50.0000 mg | ORAL_TABLET | Freq: Every day | ORAL | Status: DC
  Administered 2013-11-03: 50 mg via ORAL
  Filled 2013-11-03: qty 1

## 2013-11-03 MED ORDER — ONDANSETRON HCL 4 MG PO TABS: 4.0000 mg | ORAL_TABLET | Freq: Four times a day (QID) | ORAL | Status: DC | PRN

## 2013-11-03 MED ORDER — SODIUM CHLORIDE 0.9 % IJ SOLN
3.0000 mL | INTRAMUSCULAR | Status: DC | PRN
Start: 2013-11-03 — End: 2013-11-07
  Administered 2013-11-05: 3 mL via INTRAVENOUS

## 2013-11-03 MED ORDER — SERTRALINE HCL 100 MG PO TABS
100.0000 mg | ORAL_TABLET | Freq: Every day | ORAL | Status: DC
  Administered 2013-11-04 – 2013-11-07 (×4): 100 mg via ORAL
  Filled 2013-11-03 (×4): qty 1

## 2013-11-03 MED ORDER — SODIUM CHLORIDE 0.9 % IJ SOLN
3.0000 mL | Freq: Two times a day (BID) | INTRAMUSCULAR | Status: DC
  Administered 2013-11-03 – 2013-11-07 (×10): 3 mL via INTRAVENOUS

## 2013-11-03 MED ORDER — METOPROLOL SUCCINATE ER 50 MG PO TB24: 50.0000 mg | ORAL_TABLET | Freq: Every day | ORAL | Status: DC

## 2013-11-03 MED ORDER — ALBUTEROL SULFATE (2.5 MG/3ML) 0.083% IN NEBU: 3.0000 mL | INHALATION_SOLUTION | Freq: Four times a day (QID) | RESPIRATORY_TRACT | Status: DC | PRN

## 2013-11-03 MED ORDER — INSULIN ASPART 100 UNIT/ML ~~LOC~~ SOLN: 0.0000 [IU] | Freq: Every day | SUBCUTANEOUS | Status: DC

## 2013-11-03 MED ORDER — FOSINOPRIL SODIUM 20 MG PO TABS: 40.0000 mg | ORAL_TABLET | Freq: Every day | ORAL | Status: DC

## 2013-11-03 MED ORDER — QUINAPRIL HCL 10 MG PO TABS
40.0000 mg | ORAL_TABLET | Freq: Every day | ORAL | Status: DC
  Filled 2013-11-03: qty 4

## 2013-11-03 MED ORDER — TRAZODONE HCL 50 MG PO TABS
50.0000 mg | ORAL_TABLET | Freq: Every day | ORAL | Status: DC
  Filled 2013-11-03 (×2): qty 1

## 2013-11-03 MED ORDER — TRAZODONE HCL 100 MG PO TABS
100.0000 mg | ORAL_TABLET | Freq: Every day | ORAL | Status: DC
  Administered 2013-11-03 – 2013-11-06 (×4): 100 mg via ORAL
  Filled 2013-11-03 (×5): qty 1

## 2013-11-03 MED ORDER — SODIUM CHLORIDE 0.9 % IJ SOLN: 3.0000 mL | Freq: Two times a day (BID) | INTRAMUSCULAR | Status: DC

## 2013-11-03 MED ORDER — ARIPIPRAZOLE 5 MG PO TABS
5.0000 mg | ORAL_TABLET | Freq: Every day | ORAL | Status: DC
  Administered 2013-11-03 – 2013-11-06 (×4): 5 mg via ORAL
  Filled 2013-11-03 (×5): qty 1

## 2013-11-03 MED ORDER — CLONAZEPAM 0.5 MG PO TABS
0.5000 mg | ORAL_TABLET | Freq: Two times a day (BID) | ORAL | Status: DC
  Administered 2013-11-03 – 2013-11-07 (×8): 0.5 mg via ORAL
  Filled 2013-11-03 (×8): qty 1

## 2013-11-03 MED ORDER — LISINOPRIL 40 MG PO TABS
40.0000 mg | ORAL_TABLET | Freq: Every day | ORAL | Status: DC
  Filled 2013-11-03: qty 1

## 2013-11-03 MED ORDER — ATORVASTATIN CALCIUM 20 MG PO TABS
20.0000 mg | ORAL_TABLET | Freq: Every day | ORAL | Status: DC
  Administered 2013-11-03 – 2013-11-06 (×4): 20 mg via ORAL
  Filled 2013-11-03 (×5): qty 1

## 2013-11-03 MED ORDER — SODIUM CHLORIDE 0.9 % IV SOLN: 250.0000 mL | INTRAVENOUS | Status: DC | PRN

## 2013-11-03 MED ORDER — HYDRALAZINE HCL 20 MG/ML IJ SOLN: 10.0000 mg | Freq: Four times a day (QID) | INTRAMUSCULAR | Status: DC | PRN

## 2013-11-03 MED ORDER — HEPARIN SODIUM (PORCINE) 5000 UNIT/ML IJ SOLN
5000.0000 [IU] | Freq: Three times a day (TID) | INTRAMUSCULAR | Status: DC
  Administered 2013-11-03 – 2013-11-07 (×12): 5000 [IU] via SUBCUTANEOUS
  Filled 2013-11-03 (×16): qty 1

## 2013-11-03 MED ORDER — POTASSIUM CHLORIDE CRYS ER 20 MEQ PO TBCR
40.0000 meq | EXTENDED_RELEASE_TABLET | Freq: Every day | ORAL | Status: DC
  Administered 2013-11-03 – 2013-11-07 (×5): 40 meq via ORAL
  Filled 2013-11-03 (×5): qty 2

## 2013-11-03 MED ORDER — FUROSEMIDE 10 MG/ML IJ SOLN
120.0000 mg | Freq: Two times a day (BID) | INTRAVENOUS | Status: DC
  Administered 2013-11-03 – 2013-11-04 (×3): 120 mg via INTRAVENOUS
  Filled 2013-11-03 (×4): qty 12

## 2013-11-03 MED ORDER — INSULIN ASPART 100 UNIT/ML ~~LOC~~ SOLN
0.0000 [IU] | Freq: Three times a day (TID) | SUBCUTANEOUS | Status: DC
  Administered 2013-11-03: 3 [IU] via SUBCUTANEOUS
  Administered 2013-11-03 – 2013-11-04 (×3): 4 [IU] via SUBCUTANEOUS
  Administered 2013-11-05: 3 [IU] via SUBCUTANEOUS
  Administered 2013-11-05: 4 [IU] via SUBCUTANEOUS
  Administered 2013-11-06 – 2013-11-07 (×4): 3 [IU] via SUBCUTANEOUS

## 2013-11-03 NOTE — Progress Notes (Signed)
I co-sign for [name] for her notes and assessments

## 2013-11-03 NOTE — Progress Notes (Signed)
Utilization review completed. Markise Haymer, RN, BSN. 

## 2013-11-03 NOTE — Progress Notes (Signed)
Pt alert and oriented.  Pt anxious, and BP was elevated to 180/92.  PRN ativan was given at 1455.

## 2013-11-03 NOTE — H&P (Signed)
FMTS Attending Admit Note Patient seen and examined by me, discussed with Dr Paulla Fore and I agree with his assessment and plan of care.  Patient is awake and alert at the time of my visit, standing in room wrapped in blanket.  No apparent distress. Scant verbalization but nods 'yes' and 'no' to answer my questions.  Occasional attempt to verbalize 'yes' and 'no'.  History from patient is minimal.  Floor nurse describes episodes of more fluid speech overnight; patient has not slept since arrival to floor overnight.  Does not appear to be in distress.  Chart, labs, ECG reviewed.  A/P: CHF, diastolic dysfunction on Echo November 2014.  ECG negative for ACS and negative serial troponins. Agree with IV diuresis, continuation of home ACEI and BB.  I notice that lisinopril is listed as an allergy. Appreciate Cardiology input as to whether further testing (ECHO) is warranted at this time.   Her hypoglycemia appears to have resolved; I agree with resuming low-dose basal insulin today and monitoring CBGs, titrating basal dose based on sliding scale needs during the day.   Aphasia, intermittent.  History of bipolar d/o; I agree with plan for Psychiatry consult. Patient is on Paxil at home. Dalbert Mayotte, MD

## 2013-11-03 NOTE — Progress Notes (Signed)
Pt had 6 beat run of Vtach, family medicine MD notified

## 2013-11-03 NOTE — Progress Notes (Signed)
Family Medicine Teaching Service Daily Progress Note Intern Pager: [phone]  Patient name: [name] Medical record number: [id number] Date of birth: [birth year] Age: 48 y.o. Gender: female  Primary Care Provider: No PCP Per Patient Consultants: Cardiology Code Status: Full  Pt Overview and Major Events to Date:  2/5 - admitted, wt 359, CBG 44, BNP 2403,  lasix IV 100mg  2/6 - wt 356, Lasix IV 120mg  q 12 hr, total UOP -1680  Assessment and Plan:  [name] is a 48 y.o. female presenting with hypoglycemia, bilateral leg swelling concerning for CHF exacerbation. PMH is significant for CHF HFpEF (EF 55-60%), DM, HTN, morbid obesity, Bipolar disorder, anxiety, OSA, hx of thyrotoxicosis.  # Acute exacerbation of chronic Diastolic CHF last echo 71/2458 with EF55-60% and grade 2 diastolic dysfunction. Lung exam clear but bilateral leg edema and anasarca. CXR with mild pulmonary interstitial edema. EKG with right atrial enlargement. Followed by Dr. [name] as outpatient. Has no O2 requirement currently, sat 100% on RA. Given IV lasix 100mg  in ED and nitro patch. Additional labs: Bmet wnl (except for glucose 44), BNP 2403 (6 days ago 945), WBC 7.5, Hgb 11.5, i-stat troponin 0.02, albumin 2.7 (baseline 3), normal AST/ALT. Troponins (negative x2, 1 pending)  - c/s Cardiology re: acute dCHF exacerbation, prior cards pt - greatly appreciate all recommendations - monitor vitals closely, refuses to remain on telemetry - initial good response to diuresis, total UOP -1680,  - continue Lasix IV 120mg  q12hr - monitor daily BMET for Cr trend - Hold ACEi, BB - Lipid Panel (TC 66 / TG 76 / HDL 25 / LDL 26) - measure weights daily, 359-->356 (from chart review, dry wt appears to be about 350?) - strict I/Os   # Hypoglycemia / DM1 has not been followed for DM as outpatient in past few months. history of low blood sugar despite stopping novolog, still on lantus 55U at home. On admission, CBG 40-50,  improved with PO food/juice. - improved CBG 100-121 this morning - Decreased Lantus to 25 units in AM; may need divided dosing as OP  - novolog SSI  - diet carb mod - HgbA1c (pending)  # Dysuria - Re-ordered UA to evaluate for possible UTI  # HTN BPs elevated in ED to 166-171/98-109, likely worsened by fluid overload.  - decreasing BP trend - 112/98 - Hold home ACEi, given aggressive diuresis and decreasing BP, concern for AKI - Hold home BB (Toprol-XL) presented with bradycardia, BP since improved, consider switching to Coreg while in hospital and on discharge  # severe OSA, h/o Pulmonary HTN prescribed CPAP but does not use it. Last ECHO 07/2013 with elevated Pulm Art pressure, confirmed Pulm HTN - will not order CPAP during admission  - needs to be readdressed as OP   # Psych: history of bipolar disorder and anxiety. Speech dyspraxia Significant history that patient's heritage is from [place], per chart review pt was born in [place], however lived in [place] and came back to [place] in 2003, after graduating school. Attempted [place] education, but then only able to do home education, currently unemployed. Expresses significant social anxiety, especially with new people / environments, agrees to fear of leaving home. - Given h/o of living in [place] and returning to [place], if pt considered refugee from possible traumatic events in [place], could play a strong role in current psychiatric disorder, consider PTSD to severe anxiety component - continue home zoloft 50mg  - ordered Lorazepam 1mg  q 6 hr PRN anxiety - c/s Psychiatry re: dx  and treatment recommendations - greatly appreciate all assistance  FEN/GI: saline lock, diet carb mod  Prophylaxis: heparin  Disposition: admit to tele, inpatient status for acute on chronic dCHF exacerbation, continue IV diuresis, cardiology following, expect will need multiple days of persistent diuresis, continue to monitor for clinical improvement. Expected hospitalization  >2-3 days  Subjective: No acute events overnight. Per nursing this morning, patient remained anxious last night and did not sleep well, she was irritated by the telemetry leads and refused to wear them, also declined to take her Trazodone last night. This morning she reports that she is breathing better, less shortness of breath at rest and ambulation, still c/o persistent bilateral edema of LE and abdomen. Denies any chest pain / tightness, abdominal pain, fever/chills, n/v. Admits persistent anxiety, worsened by "new people" and "public places".  Objective: Temp:  [97.6 F (36.4 C)-98.9 F (37.2 C)] 98.1 F (36.7 C) (02/06 1033) Pulse Rate:  [54-101] 101 (02/06 1033) Resp:  [14-28] 18 (02/06 1033) BP: (112-204)/(69-109) 112/98 mmHg (02/06 1033) SpO2:  [94 %-100 %] 100 % (02/06 1033) Weight:  [356 lb 14.8 oz (161.9 kg)-359 lb 14.4 oz (163.25 kg)] 356 lb 14.8 oz (161.9 kg) (02/06 1029) Physical Exam: General: sitting upright on edge of bed, pleasant but difficulty with communication, without [place], NAD HEENT: MMM.  Cardiovascular: RRR, distant heart sounds but normal s1/s2, no murmurs. No JVD Respiratory: CTAB without wheezing, crackles, rhonci. Normal work of breathing. Abdomen: obese, soft, nontender/nondistended but does have 3+ pitting edema in lower abdomen. Bowel sounds present  Extremities: 3+ pitting edema up to knees bilaterally. WWP, +2 peripheral pulses b/l Skin: warm and dry  Neuro: awake, alert, oriented, no focal deficits Psych: speech dyspraxia with word finding difficulty, appropriate thought content, however difficulty focusing / tangential thoughts  Laboratory:  Recent Labs Lab 11/02/13 1443 11/03/13 0127  WBC 7.5 7.0  HGB 11.5* 10.9*  HCT 36.4 34.8*  PLT 487* 413*    Recent Labs Lab 10/27/13 1316 11/02/13 1443 11/02/13 1828 11/03/13 0127  NA 143 144  --  146  K 3.7 3.7  --  3.4*  CL 107 106  --  103  CO2 29 26  --  30  BUN 15 16  --  14  CREATININE 0.9  0.88  --  0.90  CALCIUM 8.8 8.8  --  8.5  PROT  --   --  6.6  --   BILITOT  --   --  0.3  --   ALKPHOS  --   --  127*  --   ALT  --   --  18  --   AST  --   --  31  --   GLUCOSE 52* 44*  --  97   Troponin I - negative x 2 (pending x 1) Pro-BNP - 2403 (b/l ~500) TSH - 4.360 HgbA1c pending  Imaging/Diagnostic Tests:  2/5 EKG Sinus bradycardia, HR 53, bi-atrial enlargement, R-axis deviation, otherwise no acute ST-T wave findings  2/5 CXR 2v IMPRESSION:  The findings are consistent with congestive heart failure with mild  pulmonary interstitial edema  Nobie Putnam, DO 11/03/2013, 11:41 AM PGY-1, [place] Intern pager: [phone], text pages welcome

## 2013-11-03 NOTE — Progress Notes (Signed)
Subjective: +orthopnea, LEE.  She reports feeling better.  Objective: Vital signs in last 24 hours: Temp:  [97.6 F (36.4 C)-98.9 F (37.2 C)] 98.9 F (37.2 C) (02/06 0159) Pulse Rate:  [54-64] 62 (02/06 0159) Resp:  [14-28] 18 (02/06 0159) BP: (119-204)/(69-109) 168/88 mmHg (02/06 0159) SpO2:  [94 %-100 %] 98 % (02/06 0159) Weight:  [359 lb 14.4 oz (163.25 kg)] 359 lb 14.4 oz (163.25 kg) (02/05 1443)    Intake/Output from previous day: 02/05 0701 - 02/06 0700 In: -  Out: 800 [Urine:800] Intake/Output this shift:    Medications Current Facility-Administered Medications  Medication Dose Route Frequency Provider Last Rate Last Dose  . 0.9 %  sodium chloride infusion  250 mL Intravenous PRN Tawanna Sat, MD      . albuterol (PROVENTIL) (2.5 MG/3ML) 0.083% nebulizer solution 3 mL  3 mL Inhalation Q6H PRN Tawanna Sat, MD      . atorvastatin (LIPITOR) tablet 20 mg  20 mg Oral q1800 Tawanna Sat, MD      . fosinopril (MONOPRIL) tablet 40 mg  40 mg Oral Daily Tawanna Sat, MD      . furosemide (LASIX) 120 mg in dextrose 5 % 50 mL IVPB  120 mg Intravenous Q12H Tawanna Sat, MD   120 mg at 11/03/13 0122  . heparin injection 5,000 Units  5,000 Units Subcutaneous Q8H Tawanna Sat, MD      . insulin aspart (novoLOG) injection 0-20 Units  0-20 Units Subcutaneous TID [facility] Tawanna Sat, MD      . insulin aspart (novoLOG) injection 0-5 Units  0-5 Units Subcutaneous QHS Tawanna Sat, MD      . insulin glargine (LANTUS) injection 25 Units  25 Units Subcutaneous Daily Tawanna Sat, MD      . LORazepam (ATIVAN) tablet 1 mg  1 mg Oral Q6H PRN Nobie Putnam, DO      . ondansetron [facility]) tablet 4 mg  4 mg Oral Q6H PRN Tawanna Sat, MD       Or  . ondansetron [facility]) injection 4 mg  4 mg Intravenous Q6H PRN Tawanna Sat, MD      . potassium chloride SA (K-DUR,KLOR-CON) CR tablet 40 mEq  40 mEq Oral Daily Tarri Fuller, PA-C      . sertraline (ZOLOFT) tablet 50 mg  50 mg Oral Daily Tawanna Sat, MD      . sodium chloride 0.9 % injection 3 mL  3 mL Intravenous Q12H Tawanna Sat, MD   3 mL at 11/03/13 0125  . sodium chloride 0.9 % injection 3 mL  3 mL Intravenous PRN Tawanna Sat, MD      . traZODone (DESYREL) tablet 50 mg  50 mg Oral QHS Willeen Niece, MD        PE: General appearance: alert, cooperative and no distress Lungs: clear to auscultation bilaterally Heart: regular rate and rhythm, S1, S2 normal, no murmur, click, rub or gallop Abdomen: +BS, obese, nontender Extremities: 3+ LEE Pulses: 2+ and symmetric Skin: Warm and Dry Neurologic: Grossly normal  Lab Results:   Recent Labs  11/02/13 1443 11/03/13 0127  WBC 7.5 7.0  HGB 11.5* 10.9*  HCT 36.4 34.8*  PLT 487* 413*   BMET  Recent Labs  11/02/13 1443 11/03/13 0127  NA 144 146  K 3.7 3.4*  CL 106 103  CO2 26 30  GLUCOSE 44* 97  BUN 16 14  CREATININE 0.88 0.90  CALCIUM 8.8 8.5   PT/INR No results found for  this basename: LABPROT, INR,  in the last 72 hours Cholesterol  Recent Labs  11/03/13 0127  CHOL 66   Lipid Panel     Component Value Date/Time   CHOL 66 11/03/2013 0127   TRIG 76 11/03/2013 0127   HDL 25* 11/03/2013 0127   CHOLHDL 2.6 11/03/2013 0127   VLDL 15 11/03/2013 0127   LDLCALC 26 11/03/2013 0127    Assessment/Plan  Principal Problem:   Acute on chronic diastolic congestive heart failure Active Problems:   Hypertensive heart disease   Pulmonary HTN, moderate to severe   Bipolar disorder   Morbid obesity   OSA (obstructive sleep apnea)   IDDM (insulin dependent diabetes mellitus)   Hypoglycemia  Plan:  Refused telemetry.  She says it makes her "shiver".   Net fluids: -0.8L.  Lasix at 120mg IV Q12.  She has a ways to go. BNP was 2403.0.   Normal EF with grade two diastolic dys(echo 56/2130).  Add daily K+.   BP poorly controlled.  On monopril, which she has not received yet.  Monitor after given.  Does not use CPAP which is prescribed. .  TSH WNL.  EKG NSR, atrial  enlargement    LOS: 1 day    HAGER, BRYAN 11/03/2013 8:41 AM  Continue diuresis Care made more difficult by obesity and bipolar disease  Jenkins Rouge

## 2013-11-03 NOTE — Consult Note (Signed)
Reason for Consult:Bipolar disorder Referring Physician: Nobie Putnam, DO  [name] is an 48 y.o. female.  HPI: Patient was seen and chart reviewed. Patient mother and brother were at bedside during this evaluation. Reportedly patient has been diagnosed with anxiety disorder and bipolar disorder, reportedly received medication management from [facility] behavior. Patient mother reported she stopped taking her medication Abilify, 7 months ago because she felt normal when taking medication. 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 Patient feels ashamed, hit herself, tried to hide from the people that she know from our community and continued to be anxious and worried. Patient has been taking Zoloft and trazodone, and reportedly has no impact because of low doses and then requested to increase the dose for the appropriateness. Patient also willing to restart her medication Abilify for better control of her emotions at this time.  Medical history: This Is a 48 year old female with a history of type 2 diabetes, congestive heart failure, hypertension, hypothyroidism, bipolar disorder presents for evaluation of shortness of breath, bilateral leg swelling, and for management of her diabetes. She has had persistent hypoglycemia despite stopping her NovoLog for the past 3 months. She stopped NovoLog one month ago, but her blood sugar has remained consistently low. Also, for the past few days she has had increasing shortness of breath bilateral leg swelling and pain, as well as abdominal pain and tightness. She has no significant history of lower extremity edema that she can recal.   ROS: Patient is extremely anxious and unable to speak well, has low self-esteem and poor concentration.  Mental Status Examination: Patient appeared as per his stated age, morbidly obese, dressed in hospital gown and  sat on the edge of the bed and fairly groomed, and has good eye contact. Patient has anxious mood and affect. She has normal rate, rhythm, and volume of speech and has on occasion difficulty to find the words because of her extreme anxiety.Her thought process is  tangential and occasionally difficult to follow. Patient has denied suicidal, homicidal ideations, intentions or plans. Patient has no evidence of auditory or visual hallucinations, delusions, and paranoia. Patient has fair insight judgment and impulse control.  Past Medical History  Diagnosis Date  . Insulin dependent type 2 diabetes mellitus, uncontrolled   . Chronic diastolic CHF (congestive heart failure)   . Morbid obesity with BMI of 50.0-59.9, adult   . Bipolar disease, chronic   . History of thyrotoxicosis   . OSA (obstructive sleep apnea) 03/06/2011  . Chest pain     a. 2012 Myoview: EF 63%, no isch/infarct  . Chronic diastolic congestive heart failure 07/23/2011  . Hypertensive heart disease 10/18/2013    History reviewed. No pertinent past surgical history.  Family History  Problem Relation Age of Onset  . Heart failure Father   . Stroke Father   . Heart disease Maternal Grandfather   . Heart disease Paternal Grandfather   . Hypertension Mother     Social History:  reports that she has never smoked. She has never used smokeless tobacco. She reports that she does not drink alcohol or use illicit drugs.  Allergies:  Allergies  Allergen Reactions  . Acetaminophen     Fits as a child "seizures-like"  . Caffeine     Tense, anxiety, increased urination  . Lisinopril     Rash with lisinopril; but fosinopril is ok per patient    Medications: I have reviewed  the patient's current medications.  Results for orders placed during the hospital encounter of 11/02/13 (from the past 48 hour(s))  CBC     Status: Abnormal   Collection Time    11/02/13  2:43 PM      Result Value Range   WBC 7.5  4.0 - 10.5 K/uL   RBC 4.28   3.87 - 5.11 MIL/uL   Hemoglobin 11.5 (*) 12.0 - 15.0 g/dL   HCT 36.4  36.0 - 46.0 %   MCV 85.0  78.0 - 100.0 fL   MCH 26.9  26.0 - 34.0 pg   MCHC 31.6  30.0 - 36.0 g/dL   RDW 16.8 (*) 11.5 - 15.5 %   Platelets 487 (*) 150 - 400 K/uL  BASIC METABOLIC PANEL     Status: Abnormal   Collection Time    11/02/13  2:43 PM      Result Value Range   Sodium 144  137 - 147 mEq/L   Potassium 3.7  3.7 - 5.3 mEq/L   Chloride 106  96 - 112 mEq/L   CO2 26  19 - 32 mEq/L   Glucose, Bld 44 (*) 70 - 99 mg/dL   Comment: CRITICAL RESULT CALLED TO, READ BACK BY AND VERIFIED WITH:     E BIVENS,RN 1701 11/02/13 D BRADLEY   BUN 16  6 - 23 mg/dL   Creatinine, Ser 0.88  0.50 - 1.10 mg/dL   Calcium 8.8  8.4 - 10.5 mg/dL   GFR calc non Af Amer 78 (*) >90 mL/min   GFR calc Af Amer >90  >90 mL/min   Comment: (NOTE)     The eGFR has been calculated using the CKD EPI equation.     This calculation has not been validated in all clinical situations.     eGFR's persistently <90 mL/min signify possible Chronic Kidney     Disease.  PRO B NATRIURETIC PEPTIDE     Status: Abnormal   Collection Time    11/02/13  2:43 PM      Result Value Range   Pro B Natriuretic peptide (BNP) 2403.0 (*) 0 - 125 pg/mL  POCT I-STAT TROPONIN I     Status: None   Collection Time    11/02/13  3:59 PM      Result Value Range   Troponin i, poc 0.02  0.00 - 0.08 ng/mL   Comment 3            Comment: Due to the release kinetics of cTnI,     a negative result within the first hours     of the onset of symptoms does not rule out     myocardial infarction with certainty.     If myocardial infarction is still suspected,     repeat the test at appropriate intervals.  GLUCOSE, CAPILLARY     Status: None   Collection Time    11/02/13  5:05 PM      Result Value Range   Glucose-Capillary 72  70 - 99 mg/dL  GLUCOSE, CAPILLARY     Status: None   Collection Time    11/02/13  5:34 PM      Result Value Range   Glucose-Capillary 84  70 - 99  mg/dL   Comment 1 Documented in Chart     Comment 2 Notify RN    HEPATIC FUNCTION PANEL     Status: Abnormal   Collection Time    11/02/13  6:28 PM  Result Value Range   Total Protein 6.6  6.0 - 8.3 g/dL   Albumin 2.7 (*) 3.5 - 5.2 g/dL   AST 31  0 - 37 U/L   ALT 18  0 - 35 U/L   Alkaline Phosphatase 127 (*) 39 - 117 U/L   Total Bilirubin 0.3  0.3 - 1.2 mg/dL   Bilirubin, Direct <0.2  0.0 - 0.3 mg/dL   Indirect Bilirubin NOT CALCULATED  0.3 - 0.9 mg/dL  TSH     Status: None   Collection Time    11/02/13  9:01 PM      Result Value Range   TSH 4.360  0.350 - 4.500 uIU/mL   Comment: Performed at [facility]     Status: Abnormal   Collection Time    11/03/13  1:27 AM      Result Value Range   Sodium 146  137 - 147 mEq/L   Potassium 3.4 (*) 3.7 - 5.3 mEq/L   Chloride 103  96 - 112 mEq/L   CO2 30  19 - 32 mEq/L   Glucose, Bld 97  70 - 99 mg/dL   BUN 14  6 - 23 mg/dL   Creatinine, Ser 0.90  0.50 - 1.10 mg/dL   Calcium 8.5  8.4 - 10.5 mg/dL   GFR calc non Af Amer 76 (*) >90 mL/min   GFR calc Af Amer 88 (*) >90 mL/min   Comment: (NOTE)     The eGFR has been calculated using the CKD EPI equation.     This calculation has not been validated in all clinical situations.     eGFR's persistently <90 mL/min signify possible Chronic Kidney     Disease.  CBC     Status: Abnormal   Collection Time    11/03/13  1:27 AM      Result Value Range   WBC 7.0  4.0 - 10.5 K/uL   RBC 4.08  3.87 - 5.11 MIL/uL   Hemoglobin 10.9 (*) 12.0 - 15.0 g/dL   HCT 34.8 (*) 36.0 - 46.0 %   MCV 85.3  78.0 - 100.0 fL   MCH 26.7  26.0 - 34.0 pg   MCHC 31.3  30.0 - 36.0 g/dL   RDW 16.9 (*) 11.5 - 15.5 %   Platelets 413 (*) 150 - 400 K/uL  HEMOGLOBIN A1C     Status: Abnormal   Collection Time    11/03/13  1:27 AM      Result Value Range   Hemoglobin A1C 7.7 (*) <5.7 %   Comment: (NOTE)                                                                                According to the ADA Clinical Practice Recommendations for 2011, when     HbA1c is used as a screening test:      >=6.5%   Diagnostic of Diabetes Mellitus               (if abnormal result is confirmed)     5.7-6.4%   Increased risk of developing Diabetes Mellitus     References:Diagnosis and Classification of Diabetes Mellitus,Diabetes  GXQJ,1941,74(YCXKG 1):S62-S69 and Standards of Medical Care in             Diabetes - 2011,Diabetes YJEH,6314,97 (Suppl 1):S11-S61.   Mean Plasma Glucose 174 (*) <117 mg/dL   Comment: Performed at [facility]  TROPONIN I     Status: None   Collection Time    11/03/13  1:27 AM      Result Value Range   Troponin I <0.30  <0.30 ng/mL   Comment:            Due to the release kinetics of cTnI,     a negative result within the first hours     of the onset of symptoms does not rule out     myocardial infarction with certainty.     If myocardial infarction is still suspected,     repeat the test at appropriate intervals.  LIPID PANEL     Status: Abnormal   Collection Time    11/03/13  1:27 AM      Result Value Range   Cholesterol 66  0 - 200 mg/dL   Triglycerides 76  <150 mg/dL   HDL 25 (*) >39 mg/dL   Total CHOL/HDL Ratio 2.6     VLDL 15  0 - 40 mg/dL   LDL Cholesterol 26  0 - 99 mg/dL   Comment:            Total Cholesterol/HDL:CHD Risk     Coronary Heart Disease Risk Table                         Men   Women      1/2 Average Risk   3.4   3.3      Average Risk       5.0   4.4      2 X Average Risk   9.6   7.1      3 X Average Risk  23.4   11.0                Use the calculated Patient Ratio     above and the CHD Risk Table     to determine the patient's CHD Risk.                ATP III CLASSIFICATION (LDL):      <100     mg/dL   Optimal      100-129  mg/dL   Near or Above                        Optimal      130-159  mg/dL   Borderline      160-189  mg/dL   High      >190     mg/dL   Very High  GLUCOSE, CAPILLARY     Status:  Abnormal   Collection Time    11/03/13  1:45 AM      Result Value Range   Glucose-Capillary 100 (*) 70 - 99 mg/dL  GLUCOSE, CAPILLARY     Status: Abnormal   Collection Time    11/03/13  7:04 AM      Result Value Range   Glucose-Capillary 121 (*) 70 - 99 mg/dL   Comment 1 Notify RN    TROPONIN I     Status: None   Collection Time    11/03/13  9:39 AM      Result  Value Range   Troponin I <0.30  <0.30 ng/mL   Comment:            Due to the release kinetics of cTnI,     a negative result within the first hours     of the onset of symptoms does not rule out     myocardial infarction with certainty.     If myocardial infarction is still suspected,     repeat the test at appropriate intervals.  BASIC METABOLIC PANEL     Status: Abnormal   Collection Time    11/03/13 10:05 AM      Result Value Range   Sodium 143  137 - 147 mEq/L   Potassium 3.6 (*) 3.7 - 5.3 mEq/L   Chloride 100  96 - 112 mEq/L   CO2 25  19 - 32 mEq/L   Glucose, Bld 136 (*) 70 - 99 mg/dL   BUN 15  6 - 23 mg/dL   Creatinine, Ser 0.84  0.50 - 1.10 mg/dL   Calcium 8.7  8.4 - 10.5 mg/dL   GFR calc non Af Amer 83 (*) >90 mL/min   GFR calc Af Amer >90  >90 mL/min   Comment: (NOTE)     The eGFR has been calculated using the CKD EPI equation.     This calculation has not been validated in all clinical situations.     eGFR's persistently <90 mL/min signify possible Chronic Kidney     Disease.  TROPONIN I     Status: None   Collection Time    11/03/13 12:05 PM      Result Value Range   Troponin I <0.30  <0.30 ng/mL   Comment:            Due to the release kinetics of cTnI,     a negative result within the first hours     of the onset of symptoms does not rule out     myocardial infarction with certainty.     If myocardial infarction is still suspected,     repeat the test at appropriate intervals.  GLUCOSE, CAPILLARY     Status: Abnormal   Collection Time    11/03/13 12:27 PM      Result Value Range    Glucose-Capillary 128 (*) 70 - 99 mg/dL  URINALYSIS, ROUTINE W REFLEX MICROSCOPIC     Status: Abnormal   Collection Time    11/03/13 12:29 PM      Result Value Range   Color, Urine YELLOW  YELLOW   APPearance HAZY (*) CLEAR   Specific Gravity, Urine 1.022  1.005 - 1.030   pH 5.0  5.0 - 8.0   Glucose, UA NEGATIVE  NEGATIVE mg/dL   Hgb urine dipstick MODERATE (*) NEGATIVE   Bilirubin Urine SMALL (*) NEGATIVE   Ketones, ur 15 (*) NEGATIVE mg/dL   Protein, ur 100 (*) NEGATIVE mg/dL   Urobilinogen, UA 0.2  0.0 - 1.0 mg/dL   Nitrite NEGATIVE  NEGATIVE   Leukocytes, UA TRACE (*) NEGATIVE  URINE MICROSCOPIC-ADD ON     Status: Abnormal   Collection Time    11/03/13 12:29 PM      Result Value Range   Squamous Epithelial / LPF RARE  RARE   WBC, UA 0-2  <3 WBC/hpf   RBC / HPF 0-2  <3 RBC/hpf   Bacteria, UA FEW (*) RARE   Casts HYALINE CASTS (*) NEGATIVE   Crystals CA OXALATE CRYSTALS (*) NEGATIVE  GLUCOSE, CAPILLARY     Status: Abnormal   Collection Time    11/03/13  4:57 PM      Result Value Range   Glucose-Capillary 170 (*) 70 - 99 mg/dL    Dg Chest 2 View  11/02/2013   CLINICAL DATA:  Dyspnea and peripheral edema.  EXAM: CHEST  2 VIEW  COMPARISON:  DG CHEST 2 VIEW dated 08/30/2013  FINDINGS: The lungs are well-expanded. The interstitial markings are mildly prominent but less conspicuous than on the previous study. The cardiopericardial silhouette remains enlarged. The pulmonary vascularity is engorged and there is cephalization of the pulmonary vascular pattern. There is no pleural effusion. The trachea is midline. The bony structures exhibit no acute abnormalities.  IMPRESSION: The findings are consistent with congestive heart failure with mild pulmonary interstitial edema.   Electronically Signed   By: David  Martinique   On: 11/02/2013 15:16    Positive for anxiety, bad mood, bipolar, depression, mood swings and sleep disturbance Blood pressure 170/90, pulse 79, temperature 99 F (37.2  C), temperature source Oral, resp. rate 18, weight 161.9 kg (356 lb 14.8 oz), SpO2 95.00%.   Assessment/Plan: Generalized anxiety disorder Bipolar disorder NOS Non compliance   Recommendation: Start Abilify 5 mg PO Qhs/mood swings Increase Sertraline 100 mg PO QD/anxiety Increase Trazodone 100 mg PO Qhs/Insomnia Start klonopin 0.5 mg PO BID/anxiety Monitor for adverse effects Appreciate psych consultation adn follow up as clinically required.  Tome Wilson,JANARDHAHA R. 11/03/2013, 5:34 PM

## 2013-11-03 NOTE — Progress Notes (Signed)
FMTS Attending Daily Note:  Annabell Sabal MD  [phone] pager  Family Practice pager:  [phone] I have discussed this patient with the resident Dr. Parks Ranger and attending physician Dr. Lindell Noe.  I agree with their findings, assessment, and care plan.  Worked in [place] during [place] of their war years, suspect her psych issues are likely stress reaction to trauma/some form of PTSD.  Agree with psych consult.  Appreciate cardiology input.

## 2013-11-04 ENCOUNTER — Encounter (HOSPITAL_COMMUNITY): Payer: Self-pay | Admitting: *Deleted

## 2013-11-04 DIAGNOSIS — I5033 Acute on chronic diastolic (congestive) heart failure: Secondary | ICD-10-CM

## 2013-11-04 LAB — BASIC METABOLIC PANEL
BUN: 12 mg/dL (ref 6–23)
BUN: 12 mg/dL (ref 6–23)
CHLORIDE: 102 meq/L (ref 96–112)
CO2: 31 meq/L (ref 19–32)
CO2: 30 mEq/L (ref 19–32)
CREATININE: 0.96 mg/dL (ref 0.50–1.10)
Calcium: 8.7 mg/dL (ref 8.4–10.5)
Calcium: 9 mg/dL (ref 8.4–10.5)
Chloride: 96 mEq/L (ref 96–112)
Creatinine, Ser: 0.84 mg/dL (ref 0.50–1.10)
GFR calc non Af Amer: 83 mL/min — ABNORMAL LOW (ref >90)
GFR, EST AFRICAN AMERICAN: 82 mL/min — AB (ref >90)
GFR, EST NON AFRICAN AMERICAN: 70 mL/min — AB (ref >90)
Glucose, Bld: 121 mg/dL — ABNORMAL HIGH (ref 70–99)
Glucose, Bld: 177 mg/dL — ABNORMAL HIGH (ref 70–99)
POTASSIUM: 3.6 meq/L — AB (ref 3.7–5.3)
Potassium: 3.7 mEq/L (ref 3.7–5.3)
SODIUM: 148 meq/L — AB (ref 137–147)
Sodium: 140 mEq/L (ref 137–147)

## 2013-11-04 LAB — GLUCOSE, CAPILLARY
GLUCOSE-CAPILLARY: 110 mg/dL — AB (ref 70–99)
Glucose-Capillary: 158 mg/dL — ABNORMAL HIGH (ref 70–99)
Glucose-Capillary: 174 mg/dL — ABNORMAL HIGH (ref 70–99)

## 2013-11-04 MED ORDER — FUROSEMIDE 10 MG/ML IJ SOLN
120.0000 mg | Freq: Two times a day (BID) | INTRAVENOUS | Status: DC
  Administered 2013-11-04 – 2013-11-07 (×7): 120 mg via INTRAVENOUS
  Filled 2013-11-04 (×8): qty 12

## 2013-11-04 MED ORDER — CEPHALEXIN 500 MG PO CAPS
500.0000 mg | ORAL_CAPSULE | Freq: Three times a day (TID) | ORAL | Status: AC
  Administered 2013-11-04 – 2013-11-06 (×9): 500 mg via ORAL
  Filled 2013-11-04 (×9): qty 1

## 2013-11-04 NOTE — Progress Notes (Signed)
The patient seemed confused at times overnight.  At one point, she was nonverbal and only shook her head to answer my questions.  She urinated on the floor once when she was fixated on the heart monitor and after she had received her 0100 dose of Lasix.  She took her nighttime medications, but only slept intermittently.

## 2013-11-04 NOTE — Progress Notes (Signed)
Patient Name: [name] Date of Encounter: 11/04/2013  Principal Problem:   Acute on chronic diastolic congestive heart failure Active Problems:   Bipolar disorder   Morbid obesity   OSA (obstructive sleep apnea)   Hypertensive heart disease   IDDM (insulin dependent diabetes mellitus)   Pulmonary HTN, moderate to severe   Length of Stay: 2  SUBJECTIVE  Very difficult to communicate with her, more than expected from the language barrier. Unusual mannerisms and hesitation. She has a sore neck and is using a heating pad. Breathing is better, but she still appears to be dyspneic and orthopneic. Substantial edema persists.  CURRENT MEDS . ARIPiprazole  5 mg Oral QHS  . atorvastatin  20 mg Oral q1800  . clonazePAM  0.5 mg Oral BID  . furosemide  120 mg Intravenous Q12H  . heparin  5,000 Units Subcutaneous Q8H  . insulin aspart  0-20 Units Subcutaneous TID WC  . insulin aspart  0-5 Units Subcutaneous QHS  . insulin glargine  25 Units Subcutaneous Daily  . potassium chloride  40 mEq Oral Daily  . sertraline  100 mg Oral Daily  . sodium chloride  3 mL Intravenous Q12H  . traZODone  100 mg Oral QHS    OBJECTIVE   Intake/Output Summary (Last 24 hours) at 11/04/13 1114 Last data filed at 11/04/13 1003  Gross per 24 hour  Intake    960 ml  Output   4600 ml  Net  -3640 ml   Filed Weights   11/02/13 1443 11/03/13 1029 11/04/13 0444  Weight: 163.25 kg (359 lb 14.4 oz) 161.9 kg (356 lb 14.8 oz) 157.3 kg (346 lb 12.5 oz)    PHYSICAL EXAM Filed Vitals:   11/03/13 1754 11/03/13 2035 11/04/13 0150 11/04/13 0444  BP: 159/73 155/77 158/67 124/66  Pulse: 85 86 77 69  Temp: 98.4 F (36.9 C) 98.5 F (36.9 C) 98 F (36.7 C) 97.9 F (36.6 C)  TempSrc: Oral Oral Oral Oral  Resp: 20 20 18 20   Weight:    157.3 kg (346 lb 12.5 oz)  SpO2: 95% 94% 100% 94%   General: Alert, oriented x3, no distress Head: no evidence of trauma, PERRL, EOMI, no exophtalmos or lid lag, no myxedema,  no xanthelasma; normal ears, nose and oropharynx Neck: cannot see jugular venous pulsations andevaluate hepatojugular reflux; brisk carotid pulses without delay and no carotid bruits Chest: clear to auscultation, no signs of consolidation by percussion or palpation, normal fremitus, symmetrical and full respiratory excursions Cardiovascular: normal position and quality of the apical impulse, regular rhythm, normal first and second heart sounds, no rubs or gallops, no murmur Abdomen: no tenderness or distention, no masses by palpation, no abnormal pulsatility or arterial bruits, normal bowel sounds, no hepatosplenomegaly Extremities: no clubbing, cyanosis; 3+ edema to the knees bilaterally; 2+ radial, ulnar and brachial pulses bilaterally; 2+ right femoral, posterior tibial and dorsalis pedis pulses; 2+ left femoral, posterior tibial and dorsalis pedis pulses; no subclavian or femoral bruits Neurological: grossly nonfocal  LABS  CBC  Recent Labs  11/02/13 1443 11/03/13 0127  WBC 7.5 7.0  HGB 11.5* 10.9*  HCT 36.4 34.8*  MCV 85.0 85.3  PLT 487* 557*   Basic Metabolic Panel  Recent Labs  11/03/13 1005 11/04/13 0354  NA 143 148*  K 3.6* 3.6*  CL 100 102  CO2 25 31  GLUCOSE 136* 121*  BUN 15 12  CREATININE 0.84 0.84  CALCIUM 8.7 8.7   Liver Function Tests  Recent Labs  11/02/13 1828  AST 31  ALT 18  ALKPHOS 127*  BILITOT 0.3  PROT 6.6  ALBUMIN 2.7*   No results found for this basename: LIPASE, AMYLASE,  in the last 72 hours Cardiac Enzymes  Recent Labs  11/03/13 0127 11/03/13 0939 11/03/13 1205  TROPONINI <0.30 <0.30 <0.30     Recent Labs  11/03/13 0127  HGBA1C 7.7*   Fasting Lipid Panel  Recent Labs  11/03/13 0127  CHOL 66  HDL 25*  LDLCALC 26  TRIG 76  CHOLHDL 2.6   Thyroid Function Tests  Recent Labs  11/02/13 2101  TSH 4.360    Radiology Studies Imaging results have been reviewed and Dg Chest 2 View  11/02/2013   CLINICAL DATA:   Dyspnea and peripheral edema.  EXAM: CHEST  2 VIEW  COMPARISON:  DG CHEST 2 VIEW dated 08/30/2013  FINDINGS: The lungs are well-expanded. The interstitial markings are mildly prominent but less conspicuous than on the previous study. The cardiopericardial silhouette remains enlarged. The pulmonary vascularity is engorged and there is cephalization of the pulmonary vascular pattern. There is no pleural effusion. The trachea is midline. The bony structures exhibit no acute abnormalities.  IMPRESSION: The findings are consistent with congestive heart failure with mild pulmonary interstitial edema.   Electronically Signed   By: David  Martinique   On: 11/02/2013 15:16    TELE NSR  ECG NSR, biatrial enlargement, RAD  ECHO last November shows biatrial enlargement, normal LVEF, normal size RV and moderate pulmonary HTN No ASD was detected.  ASSESSMENT AND PLAN Continue intravenous diuretics. May consider evaluating for atrial septal defect, will review old echo.   Sanda Klein, MD, [facility] [phone] office [phone] pager 11/04/2013 11:14 AM

## 2013-11-04 NOTE — Progress Notes (Signed)
Family Medicine Teaching Service Daily Progress Note Intern Pager: [phone]  Patient name: [name] Medical record number: [id number] Date of birth: [birth year] Age: 48 y.o. Gender: female  Primary Care Provider: No PCP Per Patient Consultants: Cardiology Code Status: Full  Pt Overview and Major Events to Date:  2/5 - admitted, wt 359, CBG 44, BNP 2403,  lasix IV 100mg  2/6 - wt 356, Lasix IV 120mg  q 12 hr, total UOP -1680 2/7 - changed lasix dose time to 10 AM / 10 PM; weight 346, UOP 5.6 L 2/6 0700 to 2/7 0700  Assessment and Plan:  [name] is a 48 y.o. female presenting with hypoglycemia, bilateral leg swelling concerning for CHF exacerbation. PMH is significant for CHF HFpEF (EF 55-60%), DM, HTN, morbid obesity, Bipolar disorder, anxiety, OSA, hx of thyrotoxicosis.  # Acute exacerbation of chronic Diastolic CHF last echo 27/2536 with EF55-60% and grade 2 diastolic dysfunction. Lung exam clear but bilateral leg edema and anasarca. CXR with mild pulmonary interstitial edema. EKG with right atrial enlargement. Followed by Dr. [name] as outpatient. Has no O2 requirement currently, sat 100% on RA. Given IV lasix 100mg  in ED and nitro patch. Additional labs: Bmet wnl (except for glucose 44), BNP 2403 (6 days prior was 945), WBC 7.5, Hgb 11.5, i-stat troponin 0.02, albumin 2.7 (baseline 3), normal AST/ALT. Troponins (negative x3) - c/s Cardiology re: acute dCHF exacerbation, prior cards pt - greatly appreciate all recommendations - monitor vitals closely, intermittently refuse stelemetry - continue Lasix IV 120mg  q12hr (UOP response has been good and Cr stable) - monitor daily BMET for Cr trend - Hold ACEi, BB for now - Lipid Panel (TC 66 / TG 76 / HDL 25 / LDL 26) - measure weights daily, strict I/Os   # Hypernatremia - noted 2/7 AM, possibly related to diuresis - plan to recheck BMP this afternoon - if still elevated, consider reduce diuretic dose  # Hypoglycemia /  DM1 has not been followed for DM as outpatient in past few months. history of low blood sugar despite stopping novolog, still on lantus 55U at home. On admission, CBG 40-50, improved with PO food/juice. - continue decreased Lantus dose 25 units in AM; may need divided dosing as OP  - continue Novolog SSI,  diet carb mod - HgbA1c 7.7  # Dysuria - UA shows moderate Hb and trace leukocytes - will order for Keflex and check urine culture  # HTN BPs elevated in ED to 166-171/98-109, likely worsened by fluid overload.  - intermittent elevations, though more normal range AM 2/7 - Hold home ACEi, given aggressive diuresis and decreasing BP, concern for AKI - Hold home BB (Toprol-XL) as pt presented with presented with bradycardia - consider switching to Coreg while in hospital and on discharge  # severe OSA, h/o Pulmonary HTN - prescribed CPAP but does not use it. Last ECHO 07/2013 with elevated Pulm Art pressure, confirmed Pulm HTN - will not order CPAP during admission  - needs to be readdressed as OP   # Psych: history of bipolar disorder and anxiety. Speech dyspraxia Significant history that patient's heritage is from [place], per chart review pt was born in [place], however lived in [place] and came back to [place] in 2003, after graduating school. Attempted [place] education, but then only able to do home education, currently unemployed. Expresses significant social anxiety, especially with new people / environments, agrees to fear of leaving home. - Given h/o of living in [place] and returning to [place], if pt  considered refugee from possible traumatic events in [place], could play a strong role in current psychiatric disorder, consider PTSD to severe anxiety component - c/s Psychiatry 2/7 - started Abilify 5 mg PO qHS, increased Zoloft to 100 mg daily and trazodone to 100 mg qHS - also started Klonopin 0.5 mg scheduled BID - consider reconsult psych if symptoms worsen or do not improve  FEN/GI: saline lock, diet  carb mod  Prophylaxis: heparin  Disposition: management as above, still requiring IV diuresis and monitoring of mental status; dispo pending clinical improvement, likely 2-3 days  Subjective: No acute events overnight. Per nursing, pt urinates in the corner of the room to relieve herself and at times has been totally nonverbal, but pt answers questions and denies complaints to me this morning other than limb pain that is improving as her swelling improves; pt does notably speak in single-word answers or very short sentences.  Objective: Temp:  [97.9 F (36.6 C)-99 F (37.2 C)] 97.9 F (36.6 C) (02/07 0444) Pulse Rate:  [69-86] 69 (02/07 0444) Resp:  [18-20] 20 (02/07 0444) BP: (124-185)/(66-104) 124/66 mmHg (02/07 0444) SpO2:  [94 %-100 %] 94 % (02/07 0444) Weight:  [346 lb 12.5 oz (157.3 kg)] 346 lb 12.5 oz (157.3 kg) (02/07 0444) Physical Exam: General: sitting upright on edge of bed, pleasant but difficulty with communication, NAD on room air HEENT: MMM.  Cardiovascular: RRR, distant heart sounds but normal s1/s2, no murmurs. No JVD Respiratory: CTAB without wheezing, crackles, rhonci. Normal work of breathing. Abdomen: obese; soft, nontender/nondistended, edema present to lower abdomen but ?improved. Bowel sounds present  Extremities: 2-3+ pitting edema up to knees bilaterally, ?slightly improved. WWP, +2 distal pulses Skin: warm and dry  Psych: appears unchanged from previous days; speech dyspraxia with word finding difficulty, appropriate thought content, however difficulty focusing / tangential thoughts  Laboratory:  Recent Labs Lab 11/02/13 1443 11/03/13 0127  WBC 7.5 7.0  HGB 11.5* 10.9*  HCT 36.4 34.8*  PLT 487* 413*    Recent Labs Lab 11/02/13 1828 11/03/13 0127 11/03/13 1005 11/04/13 0354  NA  --  146 143 148*  K  --  3.4* 3.6* 3.6*  CL  --  103 100 102  CO2  --  30 25 31   BUN  --  14 15 12   CREATININE  --  0.90 0.84 0.84  CALCIUM  --  8.5 8.7 8.7   PROT 6.6  --   --   --   BILITOT 0.3  --   --   --   ALKPHOS 127*  --   --   --   ALT 18  --   --   --   AST 31  --   --   --   GLUCOSE  --  97 136* 121*   Troponin I - negative x 2 (pending x 1) Pro-BNP - 2403 (b/l ~500) TSH - 4.360 HgbA1c pending  Imaging/Diagnostic Tests:  2/5 EKG Sinus bradycardia, HR 53, bi-atrial enlargement, R-axis deviation, otherwise no acute ST-T wave findings  2/5 CXR 2v IMPRESSION:  The findings are consistent with congestive heart failure with mild  pulmonary interstitial edema  Emmaline Kluver, MD 11/04/2013, 11:08 AM PGY-2, [place] Intern pager: [phone], text pages welcome

## 2013-11-04 NOTE — Progress Notes (Signed)
Seen and examined.  Discussed with Dr. Venetia Maxon.  Agree with his documentation and management.  We are dealing with two significant issues with patient. 1. Acute on chronic diastolic dysfunction CHF.  Diuresing well.  Still 3+ bilateral peripheral edema. 2. Psych.  Highly anxious and suspicious.  This could be bipolar, PTSD, or psychosis.  Regardless, abilify should help.  At this point we can be patient and let it take effect.

## 2013-11-05 DIAGNOSIS — F411 Generalized anxiety disorder: Secondary | ICD-10-CM

## 2013-11-05 LAB — GLUCOSE, CAPILLARY
GLUCOSE-CAPILLARY: 123 mg/dL — AB (ref 70–99)
GLUCOSE-CAPILLARY: 184 mg/dL — AB (ref 70–99)
Glucose-Capillary: 141 mg/dL — ABNORMAL HIGH (ref 70–99)
Glucose-Capillary: 181 mg/dL — ABNORMAL HIGH (ref 70–99)
Glucose-Capillary: 147 mg/dL — ABNORMAL HIGH (ref 70–99)

## 2013-11-05 LAB — BASIC METABOLIC PANEL
BUN: 12 mg/dL (ref 6–23)
CALCIUM: 8.8 mg/dL (ref 8.4–10.5)
CO2: 30 meq/L (ref 19–32)
CREATININE: 0.8 mg/dL (ref 0.50–1.10)
Chloride: 96 mEq/L (ref 96–112)
GFR calc Af Amer: >90 mL/min (ref >90)
GFR calc non Af Amer: 88 mL/min — ABNORMAL LOW (ref >90)
GLUCOSE: 170 mg/dL — AB (ref 70–99)
Potassium: 3.3 mEq/L — ABNORMAL LOW (ref 3.7–5.3)
SODIUM: 141 meq/L (ref 137–147)

## 2013-11-05 MED ORDER — CARVEDILOL 3.125 MG PO TABS
3.1250 mg | ORAL_TABLET | Freq: Two times a day (BID) | ORAL | Status: DC
  Administered 2013-11-05 – 2013-11-07 (×4): 3.125 mg via ORAL
  Filled 2013-11-05 (×7): qty 1

## 2013-11-05 NOTE — Progress Notes (Signed)
Patient Name: [name] Date of Encounter: 11/05/2013  Principal Problem:   Acute on chronic diastolic congestive heart failure Active Problems:   Bipolar disorder   Morbid obesity   OSA (obstructive sleep apnea)   Hypertensive heart disease   IDDM (insulin dependent diabetes mellitus)   Pulmonary HTN, moderate to severe   Length of Stay: 3  SUBJECTIVE  Restless night, very emotional - hard to understand why Prodigious diuresis, substantial reduction in edema Renal function still great  CURRENT MEDS . ARIPiprazole  5 mg Oral QHS  . atorvastatin  20 mg Oral q1800  . cephALEXin  500 mg Oral TID  . clonazePAM  0.5 mg Oral BID  . furosemide  120 mg Intravenous Q12H  . heparin  5,000 Units Subcutaneous Q8H  . insulin aspart  0-20 Units Subcutaneous TID WC  . insulin aspart  0-5 Units Subcutaneous QHS  . insulin glargine  25 Units Subcutaneous Daily  . potassium chloride  40 mEq Oral Daily  . sertraline  100 mg Oral Daily  . sodium chloride  3 mL Intravenous Q12H  . traZODone  100 mg Oral QHS    OBJECTIVE   Intake/Output Summary (Last 24 hours) at 11/05/13 0928 Last data filed at 11/05/13 0427  Gross per 24 hour  Intake    920 ml  Output   5500 ml  Net  -4580 ml   Filed Weights   11/03/13 1029 11/04/13 0444 11/05/13 0624  Weight: 161.9 kg (356 lb 14.8 oz) 157.3 kg (346 lb 12.5 oz) 154.6 kg (340 lb 13.3 oz)    PHYSICAL EXAM Filed Vitals:   11/04/13 1415 11/04/13 1519 11/04/13 2138 11/05/13 0624  BP: 132/68  152/92 138/75  Pulse: 75  80 78  Temp: 98 F (36.7 C)  98.4 F (36.9 C) 98.1 F (36.7 C)  TempSrc: Oral  Oral Oral  Resp: 20  18 18   Height:  5\' 6"  (1.676 m)    Weight:    154.6 kg (340 lb 13.3 oz)  SpO2: 96%  98% 98%   General: Alert, oriented x3, no distress  Head: no evidence of trauma, PERRL, EOMI, no exophtalmos or lid lag, no myxedema, no xanthelasma; normal ears, nose and oropharynx  Neck: cannot see jugular venous pulsations andevaluate  hepatojugular reflux; brisk carotid pulses without delay and no carotid bruits  Chest: clear to auscultation, no signs of consolidation by percussion or palpation, normal fremitus, symmetrical and full respiratory excursions  Cardiovascular: normal position and quality of the apical impulse, regular rhythm, normal first and second heart sounds, no rubs or gallops, no murmur  Abdomen: no tenderness or distention, no masses by palpation, no abnormal pulsatility or arterial bruits, normal bowel sounds, no hepatosplenomegaly  Extremities: no clubbing, cyanosis; 2+ edema halfway to the knees bilaterally; 2+ radial, ulnar and brachial pulses bilaterally; 2+ right femoral, posterior tibial and dorsalis pedis pulses; 2+ left femoral, posterior tibial and dorsalis pedis pulses; no subclavian or femoral bruits  Neurological: grossly nonfocal   LABS  CBC  Recent Labs  11/02/13 1443 11/03/13 0127  WBC 7.5 7.0  HGB 11.5* 10.9*  HCT 36.4 34.8*  MCV 85.0 85.3  PLT 487* 833*   Basic Metabolic Panel  Recent Labs  11/04/13 1355 11/05/13 0449  NA 140 141  K 3.7 3.3*  CL 96 96  CO2 30 30  GLUCOSE 177* 170*  BUN 12 12  CREATININE 0.96 0.80  CALCIUM 9.0 8.8   Liver Function Tests  Recent Labs  11/02/13 1828  AST 31  ALT 18  ALKPHOS 127*  BILITOT 0.3  PROT 6.6  ALBUMIN 2.7*   No results found for this basename: LIPASE, AMYLASE,  in the last 72 hours Cardiac Enzymes  Recent Labs  11/03/13 0127 11/03/13 0939 11/03/13 1205  TROPONINI <0.30 <0.30 <0.30   BNP No components found with this basename: POCBNP,  D-Dimer No results found for this basename: DDIMER,  in the last 72 hours Hemoglobin A1C  Recent Labs  11/03/13 0127  HGBA1C 7.7*   Fasting Lipid Panel  Recent Labs  11/03/13 0127  CHOL 66  HDL 25*  LDLCALC 26  TRIG 76  CHOLHDL 2.6   Thyroid Function Tests  Recent Labs  11/02/13 2101  TSH 4.360    Radiology Studies Imaging results have been  reviewed  TELE No arrhytmia   ASSESSMENT AND PLAN Still may have several liters of diuresis to go -her body habitus makes it hard to estimate. She has predominantly right heart failure, presumably due to untreated OSA. Cannot exclude an ignored ASD causing right to left shunt and right heart failure/pulmonary HTN. Her psych issues do not make it a good time to consider TEE. Would probably switch to PO diuretics tomorrow   Sanda Klein, MD, [facility] [phone] office [phone] pager 11/05/2013 9:28 AM

## 2013-11-05 NOTE — Progress Notes (Signed)
Family Medicine Teaching Service Daily Progress Note Intern Pager: [phone]  Patient name: [name] Medical record number: [id number] Date of birth: June 19, 1966 Age: 48 y.o. Gender: female  Primary Care Provider: No PCP Per Patient - [facility] Consultants: Cardiology, Psychiatry Code Status: Full  Pt Overview and Major Events to Date:  2/5 - admitted, wt 359, CBG 44, BNP 2403,  lasix IV 100mg  2/6 - wt 356, Lasix IV 120mg  q 12 hr, total UOP -1680 2/7 - changed lasix dose time to 10 AM / 10 PM; weight 346, UOP 5.6 L 2/6 0700 to 2/7 0700 2/8 - wt 340, 24 hr UOP 5.5 L (total 10 L)  Assessment and Plan:  [name] is a 48 y.o. female presenting with hypoglycemia, bilateral leg swelling concerning for CHF exacerbation. PMH is significant for CHF HFpEF (EF 55-60%), DM, HTN, morbid obesity, Bipolar disorder, anxiety, OSA, hx of thyrotoxicosis.  # Acute exacerbation of chronic Diastolic CHF - Improving last echo 07/2013 with EF55-60% and grade 2 diastolic dysfunction. Lung exam clear but bilateral leg edema and anasarca. CXR with mild pulmonary interstitial edema. EKG with right atrial enlargement. Followed by Dr. [name] as outpatient. Has no O2 requirement currently, sat 100% on RA. Given IV lasix 100mg  in ED and nitro patch. Additional labs: Bmet wnl (except for glucose 44), BNP 2403 (6 days prior was 945), WBC 7.5, Hgb 11.5, i-stat troponin 0.02, albumin 2.7 (baseline 3), normal AST/ALT. Troponins (negative x3) - c/s Cardiology re: acute dCHF exacerbation, prior cards pt - greatly appreciate all recommendations - monitor vitals closely, intermittently refuses telemetry - continue Lasix IV 120mg  q12hr (UOP response has been good and Cr stable), consider switching to PO diuretics tomorrow if continued good response - monitor daily BMET for Cr trend, 0.96-->0.80 - Hold ACEi - resume BB today - Lipid Panel (TC 66 / TG 76 / HDL 25 / LDL 26) - measure weights  daily (wt 340 today, down 19 lbs since admission), strict I/Os   # Hypernatremia - noted 2/7 AM to 148, possibly related to diuresis (consider reduced diuretic dose if continued) - re-checked BMP, Na 148-->140-->141  # Hypoglycemia / DM1 has not been followed for DM as outpatient in past few months. history of low blood sugar despite stopping novolog, still on lantus 55U at home. On admission, CBG 40-50, improved with PO food/juice. - HgbA1c 7.7 - CBGs 100-170 - continue Lantus dose 25 units in AM; may need divided dosing as OP  - continue Novolog SSI,  diet carb mod  # Dysuria - UA shows moderate Hb and trace leukocytes - f/u Urine culture - continue Keflex 500mg  TID (2/7>>)  # HTN BPs elevated in ED to 166-171/98-109, likely worsened acutely by fluid overload. - BP improved 138/75 today, HR 78 - Hold home ACEi, given aggressive diuresis and decreasing BP, concern for AKI - start Coreg 3.125mg  BID today while in hospital, continue to hold home Metoprolol-XL  # severe OSA, h/o Pulmonary HTN - prescribed CPAP but does not use it. Last ECHO 07/2013 with elevated Pulm Art pressure, confirmed Pulm HTN - will not order CPAP during admission  - needs to be readdressed as OP   # Psych: history of bipolar disorder and anxiety. Speech dyspraxia Significant history that patient's heritage is from [place], per chart review pt was born in [place], however lived in [place] and came back to [place] in 2003, after graduating school. Attempted [place] education, but then only able to do home education,  currently unemployed. Expresses significant social anxiety, especially with new people / environments, agrees to fear of leaving home. - Given h/o of living in [place] and returning to [place], if pt considered refugee from possible traumatic events in [place], could play a strong role in current psychiatric disorder, consider PTSD and severe anxiety component - c/s Psychiatry, greatly appreciate all recommendations      -  Suspected GAD, Bipolar NOS, with non-compliance in past      - continue Abilify 5mg  qHS, Zoloft 100mg  daily, Trazodone 100mg  qHS, and Klonopin 0.5mg  scheduled BID      - Expect recent med changes will need some time prior to effect, however if still persistent psychotic behavior, will consider reconsult psych if symptoms worsen or do not improve  FEN/GI: saline lock, diet carb mod  Prophylaxis: heparin  Disposition: management as above, still requiring IV diuresis and monitoring of mental status; dispo pending clinical improvement, likely 1-2 days  Subjective: Overnight reports of restlessness and anxiety, per nursing hitting bed with hand, refusing Ativan despite high anxiety, crying, some improvement after discussion with RN, eventually agreed to take Ativan, also nursing notes episode of going down hall naked. This morning patient sitting up in chair at bedside and ambulating some in room. Reports her breathing is "much better" as SOB improved. She states that she is "homesick" and asks when she can go home.  Denies suicidal or homicidal ideation.  Objective: Temp:  [98 F (36.7 C)-98.4 F (36.9 C)] 98.1 F (36.7 C) (02/08 0624) Pulse Rate:  [75-80] 78 (02/08 0624) Resp:  [18-20] 18 (02/08 0624) BP: (132-152)/(68-92) 138/75 mmHg (02/08 0624) SpO2:  [96 %-98 %] 98 % (02/08 0624) Weight:  [340 lb 13.3 oz (154.6 kg)] 340 lb 13.3 oz (154.6 kg) (02/08 5361) Physical Exam: General: sitting up in chair at bedside, pleasant but difficulty with communication, NAD Cardiovascular: RRR, distant heart sounds but normal s1/s2, no murmurs. No JVD Respiratory: CTAB without wheezing, crackles, rhonci. Normal work of breathing. Abdomen: obese; soft, NTND, edema present to lower abdomen, difficulty to judge if improved, +BS Extremities: 2-3+ pitting edema up to knees bilaterally with some improvement noted. WWP, +2 distal pulses Skin: warm and dry  Psych: appears unchanged from previous days; speech  dyspraxia with word finding difficulty, appropriate thought content, however difficulty focusing / tangential thoughts. Occasional coherent speech.  Laboratory:  Recent Labs Lab 11/02/13 1443 11/03/13 0127  WBC 7.5 7.0  HGB 11.5* 10.9*  HCT 36.4 34.8*  PLT 487* 413*    Recent Labs Lab 11/02/13 1828  11/04/13 0354 11/04/13 1355 11/05/13 0449  NA  --   < > 148* 140 141  K  --   < > 3.6* 3.7 3.3*  CL  --   < > 102 96 96  CO2  --   < > 31 30 30   BUN  --   < > 12 12 12   CREATININE  --   < > 0.84 0.96 0.80  CALCIUM  --   < > 8.7 9.0 8.8  PROT 6.6  --   --   --   --   BILITOT 0.3  --   --   --   --   ALKPHOS 127*  --   --   --   --   ALT 18  --   --   --   --   AST 31  --   --   --   --   GLUCOSE  --   < >  121* 177* 170*  < > = values in this interval not displayed. Troponin I - negative x 3 Pro-BNP - 2403 (b/l ~500) TSH - 4.360 HgbA1c - 7.7  Imaging/Diagnostic Tests:  2/5 EKG Sinus bradycardia, HR 53, bi-atrial enlargement, R-axis deviation, otherwise no acute ST-T wave findings  2/5 CXR 2v IMPRESSION:  The findings are consistent with congestive heart failure with mild  pulmonary interstitial edema  Nobie Putnam, DO 11/05/2013, 12:26 PM PGY-1, [place] Intern pager: [phone], text pages welcome

## 2013-11-05 NOTE — Progress Notes (Signed)
The patient was restless and anxious during the night.  She started hitting the bed with her hand at 2340.  She was offered Ativan, but she refused to take it and calmed down after talking with the RN.  She started crying and stated she was very anxious around 0200.  She could did not answer why she was anxious when asked, but she took the Ativan at that time.  She was placed in the chair, given heat packs for menstrual cramping, and warm blankets for comfort.  She fell asleep around 0430.

## 2013-11-05 NOTE — Progress Notes (Signed)
Seen and examined.  Agree with Dr. Raliegh Ip.  Family in the room.  Patient states she feels much better both from the anxiety and dyspnea standpoint.  Anxiety has been a chronic issue and will need to go home on current in hospital psych meds.  Still needs a bit more diuresis.  She was much more relaxed as I spoke with her today.

## 2013-11-06 DIAGNOSIS — I2789 Other specified pulmonary heart diseases: Secondary | ICD-10-CM

## 2013-11-06 DIAGNOSIS — G4733 Obstructive sleep apnea (adult) (pediatric): Secondary | ICD-10-CM

## 2013-11-06 DIAGNOSIS — I119 Hypertensive heart disease without heart failure: Secondary | ICD-10-CM

## 2013-11-06 DIAGNOSIS — I5032 Chronic diastolic (congestive) heart failure: Secondary | ICD-10-CM

## 2013-11-06 LAB — GLUCOSE, CAPILLARY
GLUCOSE-CAPILLARY: 149 mg/dL — AB (ref 70–99)
Glucose-Capillary: 120 mg/dL — ABNORMAL HIGH (ref 70–99)
Glucose-Capillary: 131 mg/dL — ABNORMAL HIGH (ref 70–99)
Glucose-Capillary: 143 mg/dL — ABNORMAL HIGH (ref 70–99)

## 2013-11-06 LAB — URINE CULTURE: Colony Count: >=100000

## 2013-11-06 LAB — BASIC METABOLIC PANEL
BUN: 11 mg/dL (ref 6–23)
CO2: 32 meq/L (ref 19–32)
Calcium: 8.8 mg/dL (ref 8.4–10.5)
Chloride: 98 mEq/L (ref 96–112)
Creatinine, Ser: 0.78 mg/dL (ref 0.50–1.10)
GFR calc non Af Amer: >90 mL/min (ref >90)
Glucose, Bld: 129 mg/dL — ABNORMAL HIGH (ref 70–99)
POTASSIUM: 3.8 meq/L (ref 3.7–5.3)
Sodium: 142 mEq/L (ref 137–147)

## 2013-11-06 MED ORDER — TRAMADOL HCL 50 MG PO TABS
50.0000 mg | ORAL_TABLET | Freq: Four times a day (QID) | ORAL | Status: DC | PRN
  Administered 2013-11-06: 50 mg via ORAL
  Filled 2013-11-06: qty 1

## 2013-11-06 MED ORDER — TRAMADOL HCL 50 MG PO TABS
50.0000 mg | ORAL_TABLET | Freq: Four times a day (QID) | ORAL | Status: DC | PRN
  Administered 2013-11-06 – 2013-11-07 (×2): 50 mg via ORAL
  Filled 2013-11-06 (×2): qty 1

## 2013-11-06 NOTE — Progress Notes (Signed)
Pt stable, VSS.  Pt ambulates frequently in room. Pt refused [place] PA evaluation.  Pt experienced anxiety throughout the shift.

## 2013-11-06 NOTE — Care Management Note (Addendum)
  Page 2 of 2   11/07/2013     5:36:42 PM   CARE MANAGEMENT NOTE 11/07/2013  Patient:  [id number]   Account Number:  [id number]  Date Initiated:  11/06/2013  Documentation initiated by:  Arrie Zuercher  Subjective/Objective Assessment:   admitted with CHF     Action/Plan:   follow for dispositon needs   Anticipated DC Date:  11/07/2013   Anticipated DC Plan:  [facility] referral  Clinical Social Worker      DC Planning Services  CM consult      Choice offered to / List presented to:  C-1 Patient        [place] arranged  HH-1 RN      Status of service:  Completed, signed off Medicare Important Message given?   (If response is "NO", the following Medicare IM given date fields will be blank) Date Medicare IM given:   Date Additional Medicare IM given:    Discharge Disposition:  [place]  Per UR Regulation:  Reviewed for med. necessity/level of care/duration of stay  If discussed at [place] of Stay Meetings, dates discussed:    Comments:  11/07/2013 Social:  From Home with parents. Today: IV Lasix CM contacted [place], [place] [phone] and confirmed member has hx/o no children and does not qualify for MCD eligibility.  Patient active with [facility] DISCOUNT. Dispsition Plan: Call [facility]. (Please follow up with [facility] regarding medication management and counseling services. ) [place] [phone] HHS:  RN for DM: [place], RN, BSN, MSHL, CCM 11/07/2013  11/06/2013 Social:  From Home with parents. furosemide (LASIX) 120 mg in dextrose 5 % 50 mL IVPB  : Dose 120 mg  :  62 mL/hr  :  Intravenous  :  Every 12 hours Current Dispsition:  Psych consult completed 11/03/2013 and pending d/c to [facility] for psych mgmt. Mariann Laster, RN, BSN, [place], [place] 11/06/2013

## 2013-11-06 NOTE — Progress Notes (Signed)
Subjective:  She was unable to carry on a conversation with me this am.  Objective:  Vital Signs in the last 24 hours: Temp:  [97.9 F (36.6 C)-98 F (36.7 C)] 98 F (36.7 C) (02/09 0622) Pulse Rate:  [78-94] 81 (02/09 0622) Resp:  [20] 20 (02/08 2035) BP: (133-154)/(78-93) 154/78 mmHg (02/09 0622) SpO2:  [92 %-95 %] 92 % (02/08 2035) Weight:  [336 lb 3.2 oz (152.5 kg)] 336 lb 3.2 oz (152.5 kg) (02/09 0622)  Intake/Output from previous day:  Intake/Output Summary (Last 24 hours) at 11/06/13 1111 Last data filed at 11/06/13 0800  Gross per 24 hour  Intake    960 ml  Output   2400 ml  Net  -1440 ml    Physical Exam: General appearance: alert, cooperative and morbidly obese Lungs: clear to auscultation bilaterally Heart: regular rate and rhythm and soft systolic murmur LSB   Rate: 80  Rhythm: normal sinus rhythm  Lab Results: No results found for this basename: WBC, HGB, PLT,  in the last 72 hours  Recent Labs  11/05/13 0449 11/06/13 0605  NA 141 142  K 3.3* 3.8  CL 96 98  CO2 30 32  GLUCOSE 170* 129*  BUN 12 11  CREATININE 0.80 0.78    Recent Labs  11/03/13 1205  TROPONINI <0.30   No results found for this basename: INR,  in the last 72 hours  Imaging: Imaging results have been reviewed  Cardiac Studies:  Assessment/Plan:   Principal Problem:   Acute on chronic diastolic congestive heart failure Active Problems:   Bipolar disorder   Hypertensive heart disease   IDDM (insulin dependent diabetes mellitus)   Pulmonary HTN, moderate to severe   Morbid obesity   OSA (obstructive sleep apnea)- non compliant with C-pap    PLAN: Difficult to tell by exam if she is still wet. I/O negative 11L, wgt down 23 lbs. She is still on IV Lasix 120 mg Q12.   Kerin Ransom PA-C [place] [phone] 11/06/2013, 11:11 AM   Agree with note written by Kerin Ransom PAC  Pt is basically non communicative. Presumably HHD /NISCM. Good diuresis. Suspect she still has a few  liters to go. She has 2+ pitting edema. Getting IV lasix. Doubt she has insight into her problems and I suspect she will have issues with compliance. Send home on oral diuretic. Nl EF by 2D late last year.   Lorretta Harp 11/06/2013 11:54 AM

## 2013-11-06 NOTE — Progress Notes (Signed)
Text paged family medicine for patient request for pain medication. Notified doctor that ultram had expired. Doctor reordered [place] and will administer as ordered. Will continue to monitor patient to end of shift.

## 2013-11-06 NOTE — Progress Notes (Signed)
I co-sign [place] student RN assessment and notes

## 2013-11-06 NOTE — Progress Notes (Signed)
Family Medicine Teaching Service Attending Note  I interviewed and examined patient [name] and reviewed their tests and x-rays.  I discussed with Dr. Raliegh Ip and reviewed their note for today.  I agree with their assessment and plan.     Additionally  Resting easily flat on her back Feet not elevated Continue diuresis as per cardiology Would elevate legs and place compressions stockings

## 2013-11-06 NOTE — Progress Notes (Signed)
Family Medicine Teaching Service Daily Progress Note Intern Pager: [phone]  Patient name: [name] Medical record number: [id number] Date of birth: May 23, 1966 Age: 48 y.o. Gender: female  Primary Care Provider: No PCP Per Patient - [facility] Consultants: Cardiology, Psychiatry Code Status: Full  Pt Overview and Major Events to Date:  2/5 - admitted, wt 359, CBG 44, BNP 2403,  lasix IV 100mg  2/6 - wt 356, Lasix IV 120mg  q 12 hr, total UOP -1680 2/7 - changed lasix dose time to 10 AM / 10 PM; weight 346, UOP 5.6 L 2/6 0700 to 2/7 0700 2/8 - wt 340, 24 hr UOP 5.5 L (total 10 L) 2/9 - wt 336, 24 hr UOP 1L (total 10.3 L), expect transition to PO diuretics today  Assessment and Plan:  [name] is a 48 y.o. female presenting with hypoglycemia, bilateral leg swelling concerning for CHF exacerbation. PMH is significant for CHF HFpEF (EF 55-60%), DM, HTN, morbid obesity, Bipolar disorder, anxiety, OSA, hx of thyrotoxicosis.  # Acute exacerbation of chronic Diastolic CHF - Improving last echo 07/2013 with EF55-60% and grade 2 diastolic dysfunction. Lung exam clear but bilateral leg edema and anasarca. CXR with mild pulmonary interstitial edema. EKG with right atrial enlargement. Followed by Dr. [name] as outpatient. Has no O2 requirement currently, sat 100% on RA. Given IV lasix 100mg  in ED and nitro patch. Additional labs: Bmet wnl (except for glucose 44), BNP 2403 (6 days prior was 945), WBC 7.5, Hgb 11.5, i-stat troponin 0.02, albumin 2.7 (baseline 3), normal AST/ALT. Troponins (negative x3), Lipid Panel (TC 66 / TG 76 / HDL 25 / LDL 26) - c/s Cardiology re: acute dCHF exacerbation, prior cards pt - greatly appreciate all recommendations - monitor vitals closely, intermittently refuses telemetry - continue Lasix IV 120mg  q12hr (UOP response has been good and Cr stable) - expect pt still requires continued diuresis, plan for continued IV currently and  discharge on PO lasix, possibly tomorrow - monitor daily BMET for Cr trend, 0.96-->0.80-->0.78 - Hold ACEi - resumed BB with Coreg - measure weights daily (wt 336 today, down 23 lbs since admission), strict I/Os  # Hypernatremia - Resolved - noted 2/7 AM to 148, possibly related to diuresis (consider reduced diuretic dose if continued) - re-checked BMP, Na 148-->140-->141-->142  # Hypoglycemia / DM1 has not been followed for DM as outpatient in past few months. history of low blood sugar despite stopping novolog, still on lantus 55U at home. On admission, CBG 40-50, improved with PO food/juice. Since CBGs have improved. - HgbA1c 7.7 - Recent CBGs 120-180s - continue Lantus dose 25 units in AM; may need divided dosing as OP  - continue Novolog SSI,  diet carb mod  # Dysuria - UA shows moderate Hb and trace leukocytes - Urine culture (2/7) with 100,000CFU, Multiple bacterial morphotypes present, none predominant - finish Keflex 500mg  TID course, total 3 days (11/06/13)  # HTN BPs elevated in ED to 166-171/98-109, likely worsened acutely by fluid overload. - BP slightly elevated to 154/78, HR 81 today - Hold home ACEi, given aggressive diuresis and decreasing BP, concern for AKI - continue Coreg 3.125mg  BID today while in hospital, continue to hold home Metoprolol-XL  # severe OSA, h/o Pulmonary HTN - prescribed CPAP but does not use it. Last ECHO 07/2013 with elevated Pulm Art pressure, confirmed Pulm HTN - will not order CPAP during admission  - needs to be readdressed as OP   # Psych: history of bipolar  disorder and anxiety. Speech dyspraxia Significant history that patient's heritage is from [place], per chart review pt was born in [place], however lived in [place] and came back to [place] in 2003, after graduating school. Attempted [place] education, but then only able to do home education, currently unemployed. Expresses significant social anxiety, especially with new people / environments, agrees  to fear of leaving home. - Given h/o of living in [place] and returning to [place], if pt considered refugee from possible traumatic events in [place], could play a strong role in current psychiatric disorder, consider PTSD and severe anxiety component - c/s Psychiatry, greatly appreciate all recommendations      - Suspected GAD, Bipolar NOS, with non-compliance in past      - continue Abilify 5mg  qHS, Zoloft 100mg  daily, Trazodone 100mg  qHS, and Klonopin 0.5mg  scheduled BID      - Expect recent med changes will need some time prior to effect, however if still persistent psychotic behavior, will consider reconsult psych if symptoms worsen or do not improve      - Contacted CSW regarding arranging outpatient Psychiatric follow-up at [facility]  # Bilateral lower ext pain No red flag symptoms on evaluation, persistently (improved) LE edema, generalized tenderness - Note h/o Tylenol allergy - try Tramadol 50mg  x 1 dose to determine if improved  FEN/GI: saline lock, diet carb mod  Prophylaxis: heparin  Disposition: management as above, still requiring IV diuresis and monitoring of mental status; dispo pending clinical improvement, expect to potentially discharge to home tomorrow (2/10) if continued good diuresis on IV, and start PO Lasix regimen on discharge  Subjective: No acute events overnight. This morning she states that she continues to feel "better", and breathing is improved. Tolerating ambulating without difficulty, good appetite, overall good response with wt loss / UOP to diuresis. Admits to improved anxiety on new medications. Only concern is when she can go home, and feels ready to go. Discussed importance of med adherence on discharge, and will arrange for f/u including psychiatry outpt.  Denies suicidal or homicidal ideation.  Objective: Temp:  [97.9 F (36.6 C)-98 F (36.7 C)] 98 F (36.7 C) (02/09 0622) Pulse Rate:  [78-94] 81 (02/09 0622) Resp:  [20] 20 (02/08 2035) BP:  (133-154)/(78-93) 154/78 mmHg (02/09 0622) SpO2:  [92 %-95 %] 92 % (02/08 2035) Weight:  [336 lb 3.2 oz (152.5 kg)] 336 lb 3.2 oz (152.5 kg) (02/09 0622) Physical Exam: General: sitting up on bedside, pleasant and cooperative, limited communication (unchanged), NAD Cardiovascular: RRR, distant heart sounds but normal s1/s2, no murmurs. No JVD Respiratory: CTAB without wheezing, crackles, rhonci. Normal work of breathing. Abdomen: obese; soft, NTND, edema present to lower abdomen, difficulty to judge if improved, +BS Extremities: (mildly improved) 2+ pitting edema up to knees bilaterally. WWP, +2 distal pulses Skin: warm and dry  Psych: mood is stable and appears congruent with affect, speech volume has decreased, thought content is appropriate, significantly reduced extraneous gesturing and tangential thoughts.  Laboratory:  Recent Labs Lab 11/02/13 1443 11/03/13 0127  WBC 7.5 7.0  HGB 11.5* 10.9*  HCT 36.4 34.8*  PLT 487* 413*    Recent Labs Lab 11/02/13 1828  11/04/13 1355 11/05/13 0449 11/06/13 0605  NA  --   < > 140 141 142  K  --   < > 3.7 3.3* 3.8  CL  --   < > 96 96 98  CO2  --   < > 30 30 32  BUN  --   < >  12 12 11   CREATININE  --   < > 0.96 0.80 0.78  CALCIUM  --   < > 9.0 8.8 8.8  PROT 6.6  --   --   --   --   BILITOT 0.3  --   --   --   --   ALKPHOS 127*  --   --   --   --   ALT 18  --   --   --   --   AST 31  --   --   --   --   GLUCOSE  --   < > 177* 170* 129*  < > = values in this interval not displayed. Troponin I - negative x 3 Pro-BNP - 2403 (b/l ~500) TSH - 4.360 HgbA1c - 7.7  Imaging/Diagnostic Tests:  2/5 EKG Sinus bradycardia, HR 53, bi-atrial enlargement, R-axis deviation, otherwise no acute ST-T wave findings  2/5 CXR 2v IMPRESSION:  The findings are consistent with congestive heart failure with mild  pulmonary interstitial edema  Nobie Putnam, DO 11/06/2013, 12:17 PM PGY-1, [place] Intern pager:  [phone], text pages welcome

## 2013-11-06 NOTE — Discharge Summary (Signed)
[facility] Discharge Summary  Patient name: [name] Medical record number: [id number] Date of birth: [birth year] Age: 48 y.o. Gender: female Date of Admission: 11/02/2013  Date of Discharge: 11/07/13 Admitting Physician: Willeen Niece, MD  Primary Care Provider: No PCP Per Patient - Dr. Annitta Needs ([facility]) Consultants: Cardiology, Psychiatry  Indication for Hospitalization: Bilateral LE Edema, DOE, Hypoglycemia  Discharge Diagnoses/Problem List:  Acute exacerbation of chronic Diastolic CHF - Resolved Hypoglycemia, in setting of T1DM - Resolved T1DM, moderately controlled - Stable PSYCH: Bipolar Disorder (NOS) and GAD - Stable UTI, uncomplicated - Resolved Hypernatremia - Resolved HTN OSA, with h/o pulmonary HTN  Disposition: Home  Discharge Condition: Stable  Discharge Exam: General: sitting up on bedside, talking on phone with Mother and tearful at times, cooperative, limited communication (unchanged), NAD  Cardiovascular: RRR, distant heart sounds but normal s1/s2, no murmurs. No JVD  Respiratory: CTAB without wheezing, crackles, rhonci. Normal work of breathing.  Abdomen: obese; soft, NTND, edema present to lower abdomen, difficulty to judge if improved, +BS  Extremities: (improved) 2+ pitting edema up to knees bilaterally. WWP, +2 distal pulses  Skin: warm and dry  Psych: Stable from yesterday with limited speech volume but appropriate thought content, no tangential thoughts, however mood appears slightly labile but doesn't exhibit mania or agitation  Brief Hospital Course: [name] is a 48 y.o. female presenting with hypoglycemia, bilateral leg swelling concerning for CHF exacerbation. PMH is significant for Diastolic CHF (EF 18-84%), DM1, HTN, morbid obesity, Bipolar disorder, anxiety, OSA, hx of thyrotoxicosis.  # Acute exacerbation of chronic Diastolic CHF - Resolved Followed by Dr. [name] (Cardiology)  outpatient (last ECHO 11/14, EF 16-60%, grade 2 diastolic dysfunction), presentation significant with +3 pitting edema b/l up to knees with anasarca and +bibasilar crackles, wt 359 (unclear dry wt), CXR (mild interstitial pulm edema). Initial work-up showed ProBNP 2403 (recently 945), Troponin-I x 3 negative, EKG (NSR with RAE, otherwise no acute findings). Consulted Cardiology for diuresis assistance, started on Lasix IV 120mg  q 12 hr, held ACEi. Continued to respond to prolonged diuresis over course of 6 days, no change in Lasix dose, with total 27 lb and 14.3 L losses, Cr remained stable. Recommended discharge regimen Lasix PO 80mg  BID (prior home regimen 60 AM and 40 PM).  # Hypoglycemia, in setting of T1DM - Resolved # T1DM, moderately controlled - Stable Unclear h/o DM management as outpatient (documented poor follow-up per chart review), reported to have stopped Novolog and only using Lantus 55u at home. On admission, CBG 40-50, improved with PO juice. HgbA1c (7.7), resumed lower dose Lantus 25u daily, SSI, and monitored CBGs remained stable.  # PSYCH: Bipolar Disorder (NOS) and GAD - Stable On admission, patient clearly demonstrated significant speech dyspraxia, unusual gestures, and behaviors. Also, occasional episodes of inappropriate / agitated behavior. Significant history that patient's heritage is from [place], per chart review pt was born in [place], however lived in [place] and came back to [place] in 2003, after graduating school. Attempted [place] education, but then only able to do home education, currently unemployed. Expresses significant social anxiety, especially with new people / environments, agrees to fear of leaving home. Considered if patient exposed to possible traumatic events in [place], could play a strong role in current psychiatric disorder, such as PTSD. Psychiatry consulted, dx patient with Bipolar NOS and GAD, started Abilify 5mg  qHS, inc Zoloft to 100mg  daily, inc Trazodone to 100mg  qHS, and  started Klonopin 0.5mg  BID. Gradual improvement in  mood / behavior, despite persistent anxiety. Arranged for continued outpatient Psych follow-up with [facility] on discharge.  # UTI, uncomplicated - Resolved - UA shows moderate Hb and trace leukocytes  - Urine culture (2/7) with 100,000CFU, Multiple bacterial morphotypes present, none predominant  - Completed Keflex 500mg  TID course, total 3 days (11/06/13)  # Hypernatremia - Resolved  - noted 2/7 AM to 148, possibly related to diuresis (consider reduced diuretic dose if continued)  - re-checked BMP, Na 148>>>142-->142  # HTN  BPs elevated in ED to 166-171/98-109, likely worsened acutely by fluid overload. Closely monitored and BP gradually stabilized, initially held ACEi d/t aggressive diuresis (concern for AKI), and held Metoprolol-XL for preferable low dose Coreg BID.  # OSA, with h/o pulmonary HTN H/o prior prescribed CPAP, does not wear it. Secondary pulm HTN on last ECHO.  Issues for Follow Up: 1. Diastolic CHF - Re-evaluate home diuretic regimen, discharged on Lasix PO 80mg  BID 2. DM regimen - Previously stopped short acting insulin, important to follow-up compliance with basal-bolus dosing 3. Psych - continued med management with follow-up at [facility] 4. OSA - re-address issue, perhaps alternative CPAP mask / device patient will comply with  Significant Procedures: none  Significant Labs and Imaging:   Recent Labs Lab 11/02/13 1443 11/03/13 0127  WBC 7.5 7.0  HGB 11.5* 10.9*  HCT 36.4 34.8*  PLT 487* 413*    Recent Labs Lab 11/02/13 1828  11/04/13 0354 11/04/13 1355 11/05/13 0449 11/06/13 0605 11/07/13 0804  NA  --   < > 148* 140 141 142 142  K  --   < > 3.6* 3.7 3.3* 3.8 3.8  CL  --   < > 102 96 96 98 99  CO2  --   < > 31 30 30  32 30  GLUCOSE  --   < > 121* 177* 170* 129* 141*  BUN  --   < > 12 12 12 11 12   CREATININE  --   < > 0.84 0.96 0.80 0.78 0.74  CALCIUM  --   < > 8.7 9.0 8.8 8.8 9.3  ALKPHOS 127*  --    --   --   --   --   --   AST 31  --   --   --   --   --   --   ALT 18  --   --   --   --   --   --   ALBUMIN 2.7*  --   --   --   --   --   --   < > = values in this interval not displayed.  Troponin I - negative x 3  Pro-BNP - 2403 (b/l ~500)  TSH - 4.360  HgbA1c - 7.7  UA - trace Leuks, mod Hgb  Urine Culture - 100,000CFU, mixed bacteria  Imaging/Diagnostic Tests:  2/5 EKG  Sinus bradycardia, HR 53, bi-atrial enlargement, R-axis deviation, otherwise no acute ST-T wave findings  2/5 CXR 2v  IMPRESSION:  The findings are consistent with congestive heart failure with mild  pulmonary interstitial edema  Results/Tests Pending at Time of Discharge: none  Discharge Medications:    Medication List         albuterol 108 (90 BASE) MCG/ACT inhaler  Commonly known as:  PROVENTIL HFA;VENTOLIN HFA  Inhale 1-2 puffs into the lungs every 6 (six) hours as needed for wheezing or shortness of breath.     ARIPiprazole 5 MG tablet  Commonly known  as:  ABILIFY  Take 1 tablet (5 mg total) by mouth at bedtime.     clonazePAM 0.5 MG tablet  Commonly known as:  KLONOPIN  Take 1 tablet (0.5 mg total) by mouth 2 (two) times daily.     fosinopril 40 MG tablet  Commonly known as:  MONOPRIL  Take 1 tablet (40 mg total) by mouth daily.     furosemide 40 MG tablet  Commonly known as:  LASIX  Take 2 tablets (80 mg total) by mouth 2 (two) times daily.     insulin aspart 100 UNIT/ML FlexPen  Commonly known as:  NOVOLOG  Inject 15 Units into the skin 3 (three) times daily with meals. Patient uses sliding scale. Starts at 15 units with cbg over 120     insulin glargine 100 UNIT/ML injection  Commonly known as:  LANTUS  Inject 0.25 mLs (25 Units total) into the skin daily.     metFORMIN 500 MG 24 hr tablet  Commonly known as:  GLUCOPHAGE-XR  Take 1,000 mg by mouth 2 (two) times daily.     metoprolol succinate 50 MG 24 hr tablet  Commonly known as:  TOPROL-XL  Take 1 tablet (50 mg total)  by mouth daily. Take with or immediately following a meal.     potassium chloride 10 MEQ CR tablet  Commonly known as:  KLOR-CON  Take 4 tablets (40 mEq total) by mouth daily.     rosuvastatin 10 MG tablet  Commonly known as:  CRESTOR  Take 1 tablet (10 mg total) by mouth daily.     sertraline 100 MG tablet  Commonly known as:  ZOLOFT  Take 1 tablet (100 mg total) by mouth daily.     traZODone 100 MG tablet  Commonly known as:  DESYREL  Take 1 tablet (100 mg total) by mouth at bedtime.        Discharge Instructions: Please refer to Patient Instructions section of EMR for full details.  Patient was counseled important signs and symptoms that should prompt return to medical care, changes in medications, dietary instructions, activity restrictions, and follow up appointments.   Follow-Up Appointments: Follow-up Information   Follow up with [facility]    . Schedule an appointment as soon as possible for a visit in 1 week.   Contact information:   [place] [address] [phone]      Follow up with Lorayne Marek, MD On 12/06/2013. (at 12:15pm with Dr. Annitta Needs)    Specialty:  Internal Medicine   Contact information:   [place] [address] [phone]       Follow up with Richardson Dopp, PA-C On 12/25/2013. (at 3:40pm with Dr. Kathlen Mody)    Specialty:  Physician Assistant   Contact information:   [address] [place] [address] [phone]       Call [facility]. (Please follow up with [facility] regarding medication management and counseling services. )    Contact information:   [place] [address] [phone]      Nobie Putnam, DO 11/08/2013, 8:47 PM PGY-1, [place]

## 2013-11-06 NOTE — Clinical Social Work Psych Note (Signed)
Psych received consult for pt being diagnosed with bipolar disorder.  Psych CSW reviewed disposition with MD.  Patient has outpatient services in place with [facility] where she receives medication management.  Psych CSW will also include this follow up information on the pt dc summary.    Psych CSW remains available for assistance.  Nonnie Done, [place] [phone]  Clinical Social Work

## 2013-11-06 NOTE — Progress Notes (Signed)
Inpatient Diabetes Program Recommendations  AACE/ADA: New Consensus Statement on Inpatient Glycemic Control (2013)  Target Ranges:  Prepandial:   less than 140 mg/dL      Peak postprandial:   less than 180 mg/dL (1-2 hours)      Critically ill patients:  140 - 180 mg/dL    Ref. Range 11/05/2013 10:48 11/05/2013 16:29 11/05/2013 21:20 11/06/2013 06:27 11/06/2013 11:15  Glucose-Capillary Latest Range: 70-99 mg/dL 181 (H) 184 (H) 147 (H) 120 (H) 149 (H)    Ref. Range 11/03/2013 01:27  Hemoglobin A1C Latest Range: <5.7 % 7.7 (H)   Reason for Visit: Note patient admitted with hypoglycemia.  Her home insulin regimen included Lantus 55 units q HS, and Novolog 15 units tid (which was recently stopped due to low CBG's).  Current orders for Inpatient glycemic control:  Novolog resistant tid with meals and HS, Lantus 25 units daily   CBG's improved today.  [name], RN, BC-ADM Inpatient Diabetes Coordinator Pager [phone]

## 2013-11-07 ENCOUNTER — Other Ambulatory Visit (HOSPITAL_COMMUNITY): Payer: Self-pay | Admitting: Internal Medicine

## 2013-11-07 LAB — BASIC METABOLIC PANEL
BUN: 12 mg/dL (ref 6–23)
CHLORIDE: 99 meq/L (ref 96–112)
CO2: 30 mEq/L (ref 19–32)
Calcium: 9.3 mg/dL (ref 8.4–10.5)
Creatinine, Ser: 0.74 mg/dL (ref 0.50–1.10)
GFR calc Af Amer: >90 mL/min (ref >90)
GFR calc non Af Amer: >90 mL/min (ref >90)
Glucose, Bld: 141 mg/dL — ABNORMAL HIGH (ref 70–99)
POTASSIUM: 3.8 meq/L (ref 3.7–5.3)
SODIUM: 142 meq/L (ref 137–147)

## 2013-11-07 LAB — GLUCOSE, CAPILLARY
GLUCOSE-CAPILLARY: 149 mg/dL — AB (ref 70–99)
Glucose-Capillary: 149 mg/dL — ABNORMAL HIGH (ref 70–99)

## 2013-11-07 MED ORDER — ARIPIPRAZOLE 5 MG PO TABS: 5.0000 mg | ORAL_TABLET | Freq: Every day | ORAL | Status: DC

## 2013-11-07 MED ORDER — INSULIN GLARGINE 100 UNIT/ML ~~LOC~~ SOLN: 25.0000 [IU] | Freq: Every day | SUBCUTANEOUS | Status: DC

## 2013-11-07 MED ORDER — TRAZODONE HCL 100 MG PO TABS: 100.0000 mg | ORAL_TABLET | Freq: Every day | ORAL | Status: DC

## 2013-11-07 MED ORDER — FUROSEMIDE 40 MG PO TABS: 80.0000 mg | ORAL_TABLET | Freq: Two times a day (BID) | ORAL | Status: DC

## 2013-11-07 MED ORDER — SERTRALINE HCL 100 MG PO TABS: 100.0000 mg | ORAL_TABLET | Freq: Every day | ORAL | Status: DC

## 2013-11-07 MED ORDER — CLONAZEPAM 0.5 MG PO TABS: 0.5000 mg | ORAL_TABLET | Freq: Two times a day (BID) | ORAL | Status: DC

## 2013-11-07 NOTE — Progress Notes (Signed)
Family Medicine Teaching Service Daily Progress Note Intern Pager: [phone]  Patient name: Temesha Queener Medical record number: [id number] Date of birth: 1965-10-20 Age: 48 y.o. Gender: female  Primary Care Provider: No PCP Per Patient - [facility] Consultants: Cardiology, Psychiatry Code Status: Full  Pt Overview and Major Events to Date:  2/5 - admitted, wt 359, CBG 44, BNP 2403,  lasix IV 100mg  2/6 - wt 356, Lasix IV 120mg  q 12 hr, total UOP -1680 2/7 - changed lasix dose time to 10 AM / 10 PM; weight 346, UOP 5.6 L 2/6 0700 to 2/7 0700 2/8 - wt 340, 24 hr UOP 5.5 L (total 10 L) 2/9 - wt 336, 24 hr UOP 1L (total 10.3 L) 2/10 - wt 332, 24 hr UOP 3.4L (total 14.3 L), expect to DC to home today  Assessment and Plan:  Tran Randle is a 48 y.o. female presenting with hypoglycemia, bilateral leg swelling concerning for CHF exacerbation. PMH is significant for CHF HFpEF (EF 55-60%), DM, HTN, morbid obesity, Bipolar disorder, anxiety, OSA, hx of thyrotoxicosis.  # Acute exacerbation of chronic Diastolic CHF - Improving last echo 07/2013 with EF55-60% and grade 2 diastolic dysfunction. Lung exam clear but bilateral leg edema and anasarca. CXR with mild pulmonary interstitial edema. EKG with right atrial enlargement. Followed by Dr. [name] as outpatient. Has no O2 requirement currently, sat 100% on RA. Given IV lasix 100mg  in ED and nitro patch. Additional labs: Bmet wnl (except for glucose 44), BNP 2403 (6 days prior was 945), WBC 7.5, Hgb 11.5, i-stat troponin 0.02, albumin 2.7 (baseline 3), normal AST/ALT. Troponins (negative x3), Lipid Panel (TC 66 / TG 76 / HDL 25 / LDL 26) - c/s Cardiology re: acute dCHF exacerbation, prior cards pt - greatly appreciate all recommendations - Good response to IV Diuresis - DC'd IV Lasix today, resume PO Lasix regimen on discharge per cardiology - plan for discharge to home today on PO diuretics, suggested regimen Lasix 80mg  BID,  f/u with cards outpt - monitor daily BMET for Cr trend, 0.96-->0.80-->0.78-->0.74 - Hold ACEi - resumed BB with Coreg - measure weights daily (wt 332 today, down 27 lbs since admission), strict I/Os  # Hypernatremia - Resolved - noted 2/7 AM to 148, possibly related to diuresis (consider reduced diuretic dose if continued) - re-checked BMP, Na 148>>>142-->142  # Hypoglycemia / DM1 has not been followed for DM as outpatient in past few months. history of low blood sugar despite stopping novolog, still on lantus 55U at home. On admission, CBG 40-50, improved with PO food/juice. Since CBGs have improved. - HgbA1c 7.7 - Recent CBGs 120-140s - continue Lantus dose 25 units in AM; may need divided dosing as OP  - continue Novolog SSI,  diet carb mod  # Dysuria - Resolved - UA shows moderate Hb and trace leukocytes - Urine culture (2/7) with 100,000CFU, Multiple bacterial morphotypes present, none predominant - Completed Keflex 500mg  TID course, total 3 days (11/06/13)  # HTN BPs elevated in ED to 166-171/98-109, likely worsened acutely by fluid overload. - BP currently stable - Resume home ACEi on discharge, held earlier given aggressive diuresis and decreasing BP, concern for AKI - continue Coreg 3.125mg  BID today while in hospital, resume home Metoprolol-XL on discharge  # severe OSA, h/o Pulmonary HTN - prescribed CPAP but does not use it. Last ECHO 07/2013 with elevated Pulm Art pressure, confirmed Pulm HTN - will not order CPAP during admission  - needs to be  readdressed as OP   # Psych: history of bipolar disorder and anxiety. Speech dyspraxia Significant history that patient's heritage is from [place], per chart review pt was born in [place], however lived in [place] and came back to [place] in 2003, after graduating school. Attempted [place] education, but then only able to do home education, currently unemployed. Expresses significant social anxiety, especially with new people / environments, agrees  to fear of leaving home. - Given h/o of living in [place] and returning to [place], if pt considered refugee from possible traumatic events in [place], could play a strong role in current psychiatric disorder, consider PTSD and severe anxiety component - c/s Psychiatry, greatly appreciate all recommendations      - Suspected GAD, Bipolar NOS, with non-compliance in past      - continue Abilify 5mg  qHS, Zoloft 100mg  daily, Trazodone 100mg  qHS, and Klonopin 0.5mg  scheduled BID      - Expect recent med changes will need some time prior to effect, however if still persistent psychotic behavior, will consider reconsult psych if symptoms worsen or do not improve      - Psych CSW arranged outpt psych f/u with [facility] for continued management on discharge  # Bilateral lower ext pain No red flag symptoms on evaluation, persistently (improved) LE edema, generalized tenderness - Note h/o Tylenol allergy - Tramadol PRN  FEN/GI: saline lock, diet carb mod  Prophylaxis: heparin  Disposition: management as above, expect transition from IV to PO diuresis today, pending clinical improvement with continued good diureseis, expect to potentially discharge to home today (2/10)  Subjective: No acute events overnight. This morning she is on the phone with her Mother and is tearful at times. She states that she still feels better, denies shortness of breath, tolerating ambulation, good UOP. Denies any complaints. Acknowledges that she is ready to go home, however unable to discern if she needs transportation assistance, states she does not drive. Advised about importance of keeping her follow-up appointments when she leaves the hospital.  Objective: Temp:  [98.4 F (36.9 C)-98.6 F (37 C)] 98.6 F (37 C) (02/10 0458) Pulse Rate:  [73-89] 89 (02/10 0458) Resp:  [18-20] 20 (02/10 0458) BP: (131-168)/(67-100) 145/84 mmHg (02/10 0458) SpO2:  [94 %-96 %] 95 % (02/10 0458) Weight:  [332 lb 10.8 oz (150.9 kg)] 332 lb 10.8 oz  (150.9 kg) (02/10 0458) Physical Exam: General: sitting up on bedside, talking on phone with Mother and tearful at times, cooperative, limited communication (unchanged), NAD Cardiovascular: RRR, distant heart sounds but normal s1/s2, no murmurs. No JVD Respiratory: CTAB without wheezing, crackles, rhonci. Normal work of breathing. Abdomen: obese; soft, NTND, edema present to lower abdomen, difficulty to judge if improved, +BS Extremities: (mildly improved) 2+ pitting edema up to knees bilaterally. WWP, +2 distal pulses Skin: warm and dry  Psych: Stable from yesterday with limited speech volume but appropriate thought content, no tangential thoughts, however mood appears slightly labile but doesn't appear manic or agitated.  Laboratory:  Recent Labs Lab 11/02/13 1443 11/03/13 0127  WBC 7.5 7.0  HGB 11.5* 10.9*  HCT 36.4 34.8*  PLT 487* 413*    Recent Labs Lab 11/02/13 1828  11/05/13 0449 11/06/13 0605 11/07/13 0804  NA  --   < > 141 142 142  K  --   < > 3.3* 3.8 3.8  CL  --   < > 96 98 99  CO2  --   < > 30 32 30  BUN  --   < >  12 11 12   CREATININE  --   < > 0.80 0.78 0.74  CALCIUM  --   < > 8.8 8.8 9.3  PROT 6.6  --   --   --   --   BILITOT 0.3  --   --   --   --   ALKPHOS 127*  --   --   --   --   ALT 18  --   --   --   --   AST 31  --   --   --   --   GLUCOSE  --   < > 170* 129* 141*  < > = values in this interval not displayed. Troponin I - negative x 3 Pro-BNP - 2403 (b/l ~500) TSH - 4.360 HgbA1c - 7.7  Imaging/Diagnostic Tests:  2/5 EKG Sinus bradycardia, HR 53, bi-atrial enlargement, R-axis deviation, otherwise no acute ST-T wave findings  2/5 CXR 2v IMPRESSION:  The findings are consistent with congestive heart failure with mild  pulmonary interstitial edema  Nobie Putnam, DO 11/07/2013, 1:45 PM PGY-1, [facility] Intern pager: [phone], text pages welcome

## 2013-11-07 NOTE — Progress Notes (Signed)
PT Cancellation Note  Patient Details Name: [name] MRN: [id number] DOB: 07-03-1966   Cancelled Treatment:    Reason Eval/Treat Not Completed: PT screened, no needs identified, will sign off. Chart reviewed and noted order for discharge and discharge instructions have been given.    Hance Caspers 11/07/2013, 4:03 PM Pager [phone]

## 2013-11-07 NOTE — Progress Notes (Signed)
Pt is being discharged home. RN went over discharge instructions with the patient and mother and answered all questions. Pt is being transported home by her mother.

## 2013-11-07 NOTE — Consult Note (Signed)
Reason for Consult:Bipolar disorder Referring Physician: Nobie Putnam, DO  [name] is an 48 y.o. female.  HPI: Patient has been diagnosed with anxiety disorder and bipolar disorder, reportedly received medication management from [facility] behavior. Patient mother reported she stopped taking her medication Abilify, 7 months ago because she felt normal when taking medication. 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 Patient feels ashamed, hit herself, tried to hide from the people that she know from our community and continued to be anxious and worried. Patient has been taking Zoloft and trazodone, and reportedly has no impact because of low doses and then requested to increase the dose for the appropriateness. Patient also willing to restart her medication Abilify for better control of her emotions at this time.  Interval History: patient has been compliant with her medication and has no adverse effects.  She has slept good and has better appetite. She has good response to her Medications and has limited symptoms of depression and anxiety. She has no evidence of suicidal or homicidal ideation.   Medical history: This Is a 48 year old female with a history of type 2 diabetes, congestive heart failure, hypertension, hypothyroidism, bipolar disorder presents for evaluation of shortness of breath, bilateral leg swelling, and for management of her diabetes. She has had persistent hypoglycemia despite stopping her NovoLog for the past 3 months. She stopped NovoLog one month ago, but her blood sugar has remained consistently low. Also, for the past few days she has had increasing shortness of breath bilateral leg swelling and pain, as well as abdominal pain and tightness. She has no significant history of lower extremity edema that she can recal.   ROS: Patient is extremely anxious and  unable to speak well, has low self-esteem and poor concentration.  Mental Status Examination: Patient appeared as per his stated age, morbidly obese, dressed in hospital gown and sat on the edge of the bed and fairly groomed, and has good eye contact. Patient has anxious mood and affect. She has normal rate, rhythm, and volume of speech and has on occasion difficulty to find the words because of her extreme anxiety.Her thought process is  tangential and occasionally difficult to follow. Patient has denied suicidal, homicidal ideations, intentions or plans. Patient has no evidence of auditory or visual hallucinations, delusions, and paranoia. Patient has fair insight judgment and impulse control.  Past Medical History  Diagnosis Date  . Insulin dependent type 2 diabetes mellitus, uncontrolled   . Chronic diastolic CHF (congestive heart failure)   . Morbid obesity with BMI of 50.0-59.9, adult   . Bipolar disease, chronic   . History of thyrotoxicosis   . OSA (obstructive sleep apnea) 03/06/2011  . Chest pain     a. 2012 Myoview: EF 63%, no isch/infarct  . Chronic diastolic congestive heart failure 07/23/2011  . Hypertensive heart disease 10/18/2013    History reviewed. No pertinent past surgical history.  Family History  Problem Relation Age of Onset  . Heart failure Father   . Stroke Father   . Heart disease Maternal Grandfather   . Heart disease Paternal Grandfather   . Hypertension Mother     Social History:  reports that she has never smoked. She has never used smokeless tobacco. She reports that she does not drink alcohol or use illicit drugs.  Allergies:  Allergies  Allergen Reactions  . Acetaminophen     Fits as a child "seizures-like"  .  Caffeine     Tense, anxiety, increased urination  . Lisinopril     Rash with lisinopril; but fosinopril is ok per patient    Medications: I have reviewed the patient's current medications.  Results for orders placed during the hospital  encounter of 11/02/13 (from the past 48 hour(s))  GLUCOSE, CAPILLARY     Status: Abnormal   Collection Time    11/05/13  4:29 PM      Result Value Range   Glucose-Capillary 184 (*) 70 - 99 mg/dL   Comment 1 Notify RN    GLUCOSE, CAPILLARY     Status: Abnormal   Collection Time    11/05/13  9:20 PM      Result Value Range   Glucose-Capillary 147 (*) 70 - 99 mg/dL  BASIC METABOLIC PANEL     Status: Abnormal   Collection Time    11/06/13  6:05 AM      Result Value Range   Sodium 142  137 - 147 mEq/L   Potassium 3.8  3.7 - 5.3 mEq/L   Chloride 98  96 - 112 mEq/L   CO2 32  19 - 32 mEq/L   Glucose, Bld 129 (*) 70 - 99 mg/dL   BUN 11  6 - 23 mg/dL   Creatinine, Ser 0.78  0.50 - 1.10 mg/dL   Calcium 8.8  8.4 - 10.5 mg/dL   GFR calc non Af Amer >90  >90 mL/min   GFR calc Af Amer >90  >90 mL/min   Comment: (NOTE)     The eGFR has been calculated using the CKD EPI equation.     This calculation has not been validated in all clinical situations.     eGFR's persistently <90 mL/min signify possible Chronic Kidney     Disease.  GLUCOSE, CAPILLARY     Status: Abnormal   Collection Time    11/06/13  6:27 AM      Result Value Range   Glucose-Capillary 120 (*) 70 - 99 mg/dL  GLUCOSE, CAPILLARY     Status: Abnormal   Collection Time    11/06/13 11:15 AM      Result Value Range   Glucose-Capillary 149 (*) 70 - 99 mg/dL  GLUCOSE, CAPILLARY     Status: Abnormal   Collection Time    11/06/13  4:27 PM      Result Value Range   Glucose-Capillary 131 (*) 70 - 99 mg/dL  GLUCOSE, CAPILLARY     Status: Abnormal   Collection Time    11/06/13  9:17 PM      Result Value Range   Glucose-Capillary 143 (*) 70 - 99 mg/dL   Comment 1 Notify RN     Comment 2 Documented in Chart    GLUCOSE, CAPILLARY     Status: Abnormal   Collection Time    11/07/13  6:36 AM      Result Value Range   Glucose-Capillary 149 (*) 70 - 99 mg/dL  BASIC METABOLIC PANEL     Status: Abnormal   Collection Time    11/07/13   8:04 AM      Result Value Range   Sodium 142  137 - 147 mEq/L   Potassium 3.8  3.7 - 5.3 mEq/L   Chloride 99  96 - 112 mEq/L   CO2 30  19 - 32 mEq/L   Glucose, Bld 141 (*) 70 - 99 mg/dL   BUN 12  6 - 23 mg/dL   Creatinine, Ser 0.74  0.50 - 1.10 mg/dL   Calcium 9.3  8.4 - 10.5 mg/dL   GFR calc non Af Amer >90  >90 mL/min   GFR calc Af Amer >90  >90 mL/min   Comment: (NOTE)     The eGFR has been calculated using the CKD EPI equation.     This calculation has not been validated in all clinical situations.     eGFR's persistently <90 mL/min signify possible Chronic Kidney     Disease.  GLUCOSE, CAPILLARY     Status: Abnormal   Collection Time    11/07/13 10:34 AM      Result Value Range   Glucose-Capillary 149 (*) 70 - 99 mg/dL    No results found.  Positive for anxiety, bad mood, bipolar, depression, mood swings and sleep disturbance Blood pressure 145/84, pulse 89, temperature 98.6 F (37 C), temperature source Oral, resp. rate 20, height '5\' 6"'  (1.676 m), weight 150.9 kg (332 lb 10.8 oz), SpO2 95.00%.   Assessment/Plan: Generalized anxiety disorder Bipolar disorder NOS Non compliance   Recommendation: Recommend out patient psychiatric care at [facility] and does not meet criteria for acute psych hospitalization Spoke with Psych social service, who  will provide information about the program if needs further assistance Continue Abilify 5 mg PO Qhs/mood swings Continue Sertraline 100 mg PO QD/anxiety Continue Trazodone 100 mg PO Qhs/Insomnia Continue Klonopin 0.5 mg BID Will sign off psych consultation   Sidnie Swalley,JANARDHAHA R. 11/07/2013, 11:47 AM

## 2013-11-07 NOTE — Progress Notes (Signed)
Family Medicine Teaching Service Attending Note  I discussed patient [name]  with Dr. Raliegh Ip and reviewed their note for today.  I agree with their assessment and plan.

## 2013-11-07 NOTE — Discharge Instructions (Signed)
You were hospitalized for fluid overload (from your heart failure) and required IV medicine to remove extra fluid, which has improved your breathing and reduced your swelling. Also, you were found to have low blood sugar, we have since stabilized your blood sugars, and made recommendations to your insulin regimen.  For your CHF (heart failure) and fluid management we have increased your Lasix (Furosemide) to the following:    - Furosemide 80mg  twice daily (new rx with 40mg  tablets, previously with 20mg  tablets)    - Be sure to weigh yourself daily, and track your wt, caution about gaining >3 lbs in 24 hours, you may need to increase your Lasix dose, please call your regular doctor / cardiologist if concerned. Especially if you develop return / worsening of symptoms (primarily shortness of breath or CP)  For your Diabetes, we have changed your insulin regimen:     - Lantus decreased dose from 55u daily to 25u daily - continue to monitor your blood sugars, if you feel like you need to return to your old regimen of 55u daily, then this may be necessary based on dietary changes at home.     - You may continue your regular (previously prescribed) Novolog (short acting) insulin sliding scale     - Make sure that you bring your sugar readings in to your primary doctor to discuss your control, as this regimen may need to change.  For your Mood Disorder and Anxiety, the inpatient Psychiatry team has made multiple changes to your medicines:     - Started Abilify 5mg  nightly     - Increased Sertraline to 100mg  daily     - Increased Trazodone to 100mg  nightly     - Started Klonopin 0.5 mg PO BID/anxiety - Please continue to follow-up with [facility] for continued management for your psychiatric concerns, and for further refills on these medications.  Heart Failure Heart failure means your heart has trouble pumping blood. This makes it hard for your body to work well. Heart failure is usually a long-term  (chronic) condition. You must take good care of yourself and follow your doctor's treatment plan. HOME CARE  Take your heart medicine as told by your doctor.  Do not stop taking medicine unless your doctor tells you to.  Do not skip any dose of medicine.  Refill your medicines before they run out.  Take other medicines only as told by your doctor or pharmacist.  Stay active if told by your doctor. The elderly and people with severe heart failure should talk with a doctor about physical activity.  Eat heart healthy foods. Choose foods that are without trans fat and are low in saturated fat, cholesterol, and salt (sodium). This includes fresh or frozen fruits and vegetables, fish, lean meats, fat-free or low-fat dairy foods, whole grains, and high-fiber foods. Lentils and dried peas and beans (legumes) are also good choices.  Limit salt if told by your doctor.  Cook in a healthy way. Roast, grill, broil, bake, poach, steam, or stir-fry foods.  Limit fluids as told by your doctor.  Weigh yourself every morning. Do this after you pee (urinate) and before you eat breakfast. Write down your weight to give to your doctor.  Take your blood pressure and write it down if your doctor tell you to.  Ask your doctor how to check your pulse. Check your pulse as told.  Lose weight if told by your doctor.  Stop smoking or chewing tobacco. Do not use gum or  patches that help you quit without your doctor's approval.  Schedule and go to doctor visits as told.  Nonpregnant women should have no more than 1 drink a day. Men should have no more than 2 drinks a day. Talk to your doctor about drinking alcohol.  Stop illegal drug use.  Stay current with shots (immunizations).  Manage your health conditions as told by your doctor.  Learn to manage your stress.  Rest when you are tired.  If it is really hot outside:  Avoid intense activities.  Use air conditioning or fans, or get in a cooler  place.  Avoid caffeine and alcohol.  Wear loose-fitting, lightweight, and light-colored clothing.  If it is really cold outside:  Avoid intense activities.  Layer your clothing.  Wear mittens or gloves, a hat, and a scarf when going outside.  Avoid alcohol.  Learn about heart failure and get support as needed.  Get help to maintain or improve your quality of life and your ability to care for yourself as needed. GET HELP IF:   You gain 03 lb/1.4 kg or more in 1 day or 05 lb/2.3 kg in a week.  You are more short of breath than usual.  You cannot do your normal activities.  You tire easily.  You cough more than normal, especially with activity.  You have any or more puffiness (swelling) in areas such as your hands, feet, ankles, or belly (abdomen).  You cannot sleep because it is hard to breathe.  You feel like your heart is beating fast (palpitations).  You get dizzy or lightheaded when you stand up. GET HELP RIGHT AWAY IF:   You have trouble breathing.  There is a change in mental status, such as becoming less alert or not being able to focus.  You have chest pain or discomfort.  You faint. MAKE SURE YOU:   Understand these instructions.  Will watch your condition.  Will get help right away if you are not doing well or get worse. Document Released: 06/23/2008 Document Revised: 01/09/2013 Document Reviewed: 04/14/2012 [facility] Patient Information 2014 [place], [place].

## 2013-11-07 NOTE — Progress Notes (Signed)
UR completed Crewe Heathman K. Kealie Barrie, RN, BSN, [place], CCM  11/07/2013 6:06 PM

## 2013-11-09 ENCOUNTER — Ambulatory Visit: Payer: No Typology Code available for payment source

## 2013-11-10 ENCOUNTER — Ambulatory Visit: Payer: No Typology Code available for payment source | Attending: Internal Medicine | Admitting: Internal Medicine

## 2013-11-10 VITALS — BP 137/58 | HR 69 | Temp 98.6°F | Resp 18 | Ht 66.14 in | Wt 331.0 lb

## 2013-11-10 DIAGNOSIS — F411 Generalized anxiety disorder: Secondary | ICD-10-CM | POA: Insufficient documentation

## 2013-11-10 DIAGNOSIS — E119 Type 2 diabetes mellitus without complications: Secondary | ICD-10-CM

## 2013-11-10 DIAGNOSIS — Z79899 Other long term (current) drug therapy: Secondary | ICD-10-CM | POA: Insufficient documentation

## 2013-11-10 DIAGNOSIS — F4310 Post-traumatic stress disorder, unspecified: Secondary | ICD-10-CM | POA: Insufficient documentation

## 2013-11-10 DIAGNOSIS — G4733 Obstructive sleep apnea (adult) (pediatric): Secondary | ICD-10-CM | POA: Insufficient documentation

## 2013-11-10 DIAGNOSIS — I110 Hypertensive heart disease with heart failure: Secondary | ICD-10-CM | POA: Insufficient documentation

## 2013-11-10 DIAGNOSIS — F319 Bipolar disorder, unspecified: Secondary | ICD-10-CM | POA: Insufficient documentation

## 2013-11-10 DIAGNOSIS — E1165 Type 2 diabetes mellitus with hyperglycemia: Secondary | ICD-10-CM

## 2013-11-10 DIAGNOSIS — IMO0001 Reserved for inherently not codable concepts without codable children: Secondary | ICD-10-CM | POA: Insufficient documentation

## 2013-11-10 DIAGNOSIS — I509 Heart failure, unspecified: Secondary | ICD-10-CM | POA: Insufficient documentation

## 2013-11-10 DIAGNOSIS — I5033 Acute on chronic diastolic (congestive) heart failure: Secondary | ICD-10-CM | POA: Insufficient documentation

## 2013-11-10 LAB — GLUCOSE, POCT (MANUAL RESULT ENTRY): POC Glucose: 143 mg/dl — AB (ref 70–99)

## 2013-11-10 MED ORDER — INSULIN ASPART 100 UNIT/ML FLEXPEN: 15.0000 [IU] | PEN_INJECTOR | Freq: Three times a day (TID) | SUBCUTANEOUS | Status: DC

## 2013-11-10 MED ORDER — INSULIN GLARGINE 100 UNIT/ML ~~LOC~~ SOLN: 25.0000 [IU] | Freq: Every day | SUBCUTANEOUS | Status: DC

## 2013-11-10 NOTE — Progress Notes (Signed)
Patient ID: [name], female   DOB: February 28, 1966, 48 y.o.   MRN: [id number]   CC:  HPI: [name] is a 48 y.o. female presenting for post hospital followup appointment in between 2/5 and 2/10, bilateral leg swelling concerning for CHF exacerbation. PMH is significant for Diastolic CHF (EF 29-51%), DM1, HTN, morbid obesity, Bipolar disorder, anxiety, OSA, hx of thyrotoxicosis. Patient has a history of sleep apnea but does not use a CPAP machine. Followed by Dr. [name] (Cardiology) outpatient (last ECHO 11/14, EF 88-41%, grade 2 diastolic dysfunction), presentation significant with +3 pitting edema b/l up to knees with anasarca and +bibasilar crackles, wt 359 (unclear dry wt), CXR (mild interstitial pulm edema). Initial work-up showed ProBNP 2403 (recently 945), Troponin-I x 3 negative, EKG (NSR with RAE, otherwise no acute findings). Cardiology was consulted for diuresis assistance and the patient was diuresed with one 20 mg every 12 IV Lasix. Creatinine remained stable. She was switched to a regimen of Lasix 80 mg twice a day. She has an appointment with cardiology 3/30.  Mother concerned about alteration in her diabetic regimen. The patient was asked to change insulin to 25 units in the morning. She is noted to she is hyperglycemic in the morning with CBG in 160s.  Patient also multiple psychiatric issues and has bipolar disorder and PTSD. Medications adjusted in the hospital Advice to followup with [facility] Currently patient complaining off in exertion, minimal swelling in her legs. She does complain of abdominal bloating.  Allergies  Allergen Reactions  . Acetaminophen     Fits as a child "seizures-like"  . Caffeine     Tense, anxiety, increased urination  . Lisinopril     Rash with lisinopril; but fosinopril is ok per patient   Past Medical History  Diagnosis Date  . Insulin dependent type 2 diabetes mellitus, uncontrolled   . Chronic diastolic CHF (congestive heart failure)    . Morbid obesity with BMI of 50.0-59.9, adult   . Bipolar disease, chronic   . History of thyrotoxicosis   . OSA (obstructive sleep apnea) 03/06/2011  . Chest pain     a. 2012 Myoview: EF 63%, no isch/infarct  . Chronic diastolic congestive heart failure 07/23/2011  . Hypertensive heart disease 10/18/2013   Current Outpatient Prescriptions on File Prior to Visit  Medication Sig Dispense Refill  . albuterol (PROVENTIL HFA;VENTOLIN HFA) 108 (90 BASE) MCG/ACT inhaler Inhale 1-2 puffs into the lungs every 6 (six) hours as needed for wheezing or shortness of breath.  1 Inhaler  3  . ARIPiprazole (ABILIFY) 5 MG tablet Take 1 tablet (5 mg total) by mouth at bedtime.  30 tablet  0  . clonazePAM (KLONOPIN) 0.5 MG tablet Take 1 tablet (0.5 mg total) by mouth 2 (two) times daily.  60 tablet  0  . fosinopril (MONOPRIL) 40 MG tablet Take 1 tablet (40 mg total) by mouth daily.  30 tablet  5  . fosinopril (MONOPRIL) 40 MG tablet TAKE 1 TABLET (40 MG TOTAL) BY MOUTH DAILY.  90 tablet  0  . furosemide (LASIX) 40 MG tablet Take 2 tablets (80 mg total) by mouth 2 (two) times daily.  120 tablet  0  . metFORMIN (GLUCOPHAGE-XR) 500 MG 24 hr tablet Take 1,000 mg by mouth 2 (two) times daily.       . metoprolol succinate (TOPROL-XL) 50 MG 24 hr tablet Take 1 tablet (50 mg total) by mouth daily. Take with or immediately following a meal.  90 tablet  3  .  potassium chloride (KLOR-CON) 10 MEQ CR tablet Take 4 tablets (40 mEq total) by mouth daily.  120 tablet  3  . rosuvastatin (CRESTOR) 10 MG tablet Take 1 tablet (10 mg total) by mouth daily.  90 tablet  3  . sertraline (ZOLOFT) 100 MG tablet Take 1 tablet (100 mg total) by mouth daily.  30 tablet  0  . traZODone (DESYREL) 100 MG tablet Take 1 tablet (100 mg total) by mouth at bedtime.  30 tablet  0   No current facility-administered medications on file prior to visit.   Family History  Problem Relation Age of Onset  . Heart failure Father   . Stroke Father   .  Heart disease Maternal Grandfather   . Heart disease Paternal Grandfather   . Hypertension Mother    History   Social History  . Marital Status: Single    Spouse Name: N/A    Number of Children: 0  . Years of Education: 72   Occupational History  . unemployed    Social History Main Topics  . Smoking status: Never Smoker   . Smokeless tobacco: Never Used  . Alcohol Use: No  . Drug Use: No  . Sexual Activity: Not Currently    Birth Control/ Protection: None   Other Topics Concern  . 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 But ran out of money and is no longer attending school. (Note patient has bipolar disorder).      Born in [place] but lived in [place] before coming back to [place].       Primary language is Arabic. Lives with mother and brother.                Review of Systems  Constitutional: As in history of present illness  HENT: Negative for ear pain, nosebleeds, congestion, facial swelling, rhinorrhea, neck pain, neck stiffness and ear discharge.   Eyes: Negative for pain, discharge, redness, itching and visual disturbance.  Respiratory: Negative for cough, choking, chest tightness, shortness of breath, wheezing and stridor.   Cardiovascular: Negative for chest pain, palpitations and leg swelling.  Gastrointestinal: Negative for abdominal distention.  Genitourinary: Negative for dysuria, urgency, frequency, hematuria, flank pain, decreased urine volume, difficulty urinating and dyspareunia.  Musculoskeletal: Negative for back pain, joint swelling, arthralgias and gait problem.  Neurological: Negative for dizziness, tremors, seizures, syncope, facial asymmetry, speech difficulty, weakness, light-headedness, numbness and headaches.  Hematological: Negative for adenopathy. Does not bruise/bleed easily.  Psychiatric/Behavioral: Negative for hallucinations,  behavioral problems, confusion, dysphoric mood, decreased concentration and agitation.    Objective:   Filed Vitals:   11/10/13 1024  BP: 137/58  Pulse: 69  Temp: 98.6 F (37 C)  Resp: 18    Physical Exam  Constitutional: Appears well-developed and well-nourished. No distress.  HENT: Normocephalic. External right and left ear normal. Oropharynx is clear and moist.  Eyes: Conjunctivae and EOM are normal. PERRLA, no scleral icterus.  Neck: Normal ROM. Neck supple. No JVD. No tracheal deviation. No thyromegaly.  CVS: RRR, S1/S2 +, no murmurs, no gallops, no carotid bruit.  Pulmonary: Effort and breath sounds normal, no stridor, rhonchi, wheezes, rales.  Abdominal: Soft. BS +,  no distension, tenderness, rebound or guarding.  Musculoskeletal: Normal range of motion. No edema and no tenderness.  Lymphadenopathy: No lymphadenopathy noted, cervical, inguinal.1+ pitting edema  Neuro: Alert.  Normal reflexes, muscle tone coordination. No cranial nerve deficit. Skin: Skin is warm and dry. No rash noted. Not diaphoretic. No erythema. No pallor.  Psychiatric: Normal mood and affect. Behavior, judgment, thought content normal.   Lab Results  Component Value Date   WBC 7.0 11/03/2013   HGB 10.9* 11/03/2013   HCT 34.8* 11/03/2013   MCV 85.3 11/03/2013   PLT 413* 11/03/2013   Lab Results  Component Value Date   CREATININE 0.74 11/07/2013   BUN 12 11/07/2013   NA 142 11/07/2013   K 3.8 11/07/2013   CL 99 11/07/2013   CO2 30 11/07/2013    Lab Results  Component Value Date   HGBA1C 7.7* 11/03/2013   Lipid Panel     Component Value Date/Time   CHOL 66 11/03/2013 0127   TRIG 76 11/03/2013 0127   HDL 25* 11/03/2013 0127   CHOLHDL 2.6 11/03/2013 0127   VLDL 15 11/03/2013 0127   LDLCALC 26 11/03/2013 0127       Assessment and plan:   Patient Active Problem List   Diagnosis Date Noted  . Pulmonary HTN, moderate to severe 11/03/2013  . Acute on chronic diastolic congestive heart failure 11/02/2013  .  Hypertensive heart disease 10/18/2013  . IDDM (insulin dependent diabetes mellitus) 10/18/2013  . Chronic diastolic congestive heart failure 07/23/2011  . OSA (obstructive sleep apnea)- non compliant with C-pap 03/06/2011  . Morbid obesity 02/19/2011  . Bipolar disorder    # Acute exacerbation of chronic Diastolic CHF - Resolved Continue Lasix at the current dose Repeat labs in 2 weeks at our clinic to ensure stability of renal function Encouraged use CPAP machine and call pulmonary for a mask fitting Followup with cardiology on 3/30     Diabetes Apparently CBC has been stable with the new regimen however the mother states that the patient would like to switch her Lantus insulin back to bedtime A1c of 7.7 Recommended to continue NovoLog   Bipolar disorder Followup with [facility]  Followup in 2 months   The patient was given clear instructions to go to ER or return to medical center if symptoms don't improve, worsen or new problems develop. The patient verbalized understanding. The patient was told to call to get any lab results if not heard anything in the next week.

## 2013-11-10 NOTE — Progress Notes (Signed)
Pt is her following up on her diabetes. Pt reports having pain in her joints.

## 2013-11-22 ENCOUNTER — Ambulatory Visit: Payer: No Typology Code available for payment source | Attending: Internal Medicine

## 2013-11-22 DIAGNOSIS — E119 Type 2 diabetes mellitus without complications: Secondary | ICD-10-CM

## 2013-11-22 LAB — CBC WITH DIFFERENTIAL/PLATELET
BASOS ABS: 0 10*3/uL (ref 0.0–0.1)
Basophils Relative: 0 % (ref 0–1)
EOS PCT: 1 % (ref 0–5)
Eosinophils Absolute: 0.1 10*3/uL (ref 0.0–0.7)
HCT: 41.5 % (ref 36.0–46.0)
Hemoglobin: 13 g/dL (ref 12.0–15.0)
Lymphocytes Relative: 30 % (ref 12–46)
Lymphs Abs: 2.3 10*3/uL (ref 0.7–4.0)
MCH: 25.9 pg — AB (ref 26.0–34.0)
MCHC: 31.3 g/dL (ref 30.0–36.0)
MCV: 82.7 fL (ref 78.0–100.0)
MONO ABS: 0.7 10*3/uL (ref 0.1–1.0)
Monocytes Relative: 9 % (ref 3–12)
Neutro Abs: 4.6 10*3/uL (ref 1.7–7.7)
Neutrophils Relative %: 60 % (ref 43–77)
PLATELETS: 278 10*3/uL (ref 150–400)
RBC: 5.02 MIL/uL (ref 3.87–5.11)
RDW: 16.4 % — ABNORMAL HIGH (ref 11.5–15.5)
WBC: 7.7 10*3/uL (ref 4.0–10.5)

## 2013-11-22 LAB — COMPREHENSIVE METABOLIC PANEL
ALT: 11 U/L (ref 0–35)
AST: 24 U/L (ref 0–37)
Albumin: 4 g/dL (ref 3.5–5.2)
Alkaline Phosphatase: 102 U/L (ref 39–117)
BILIRUBIN TOTAL: 0.6 mg/dL (ref 0.2–1.2)
BUN: 13 mg/dL (ref 6–23)
CO2: 27 mEq/L (ref 19–32)
CREATININE: 0.85 mg/dL (ref 0.50–1.10)
Calcium: 9.7 mg/dL (ref 8.4–10.5)
Chloride: 103 mEq/L (ref 96–112)
Glucose, Bld: 119 mg/dL — ABNORMAL HIGH (ref 70–99)
Potassium: 4.6 mEq/L (ref 3.5–5.3)
Sodium: 141 mEq/L (ref 135–145)
Total Protein: 7.5 g/dL (ref 6.0–8.3)

## 2013-11-24 ENCOUNTER — Other Ambulatory Visit: Payer: No Typology Code available for payment source

## 2013-11-28 ENCOUNTER — Telehealth: Payer: Self-pay | Admitting: *Deleted

## 2013-11-28 ENCOUNTER — Ambulatory Visit: Payer: No Typology Code available for payment source | Attending: Internal Medicine | Admitting: Internal Medicine

## 2013-11-28 ENCOUNTER — Encounter: Payer: Self-pay | Admitting: Internal Medicine

## 2013-11-28 VITALS — BP 156/103 | HR 66 | Temp 98.1°F | Wt 323.8 lb

## 2013-11-28 DIAGNOSIS — Z8639 Personal history of other endocrine, nutritional and metabolic disease: Secondary | ICD-10-CM | POA: Insufficient documentation

## 2013-11-28 DIAGNOSIS — F319 Bipolar disorder, unspecified: Secondary | ICD-10-CM

## 2013-11-28 DIAGNOSIS — E119 Type 2 diabetes mellitus without complications: Secondary | ICD-10-CM

## 2013-11-28 DIAGNOSIS — I10 Essential (primary) hypertension: Secondary | ICD-10-CM

## 2013-11-28 HISTORY — DX: Essential (primary) hypertension: I10

## 2013-11-28 MED ORDER — FUROSEMIDE 40 MG PO TABS: 80.0000 mg | ORAL_TABLET | Freq: Two times a day (BID) | ORAL | Status: DC

## 2013-11-28 MED ORDER — TRAZODONE HCL 100 MG PO TABS: 100.0000 mg | ORAL_TABLET | Freq: Every day | ORAL | Status: DC

## 2013-11-28 MED ORDER — FOSINOPRIL SODIUM 40 MG PO TABS: 40.0000 mg | ORAL_TABLET | Freq: Every day | ORAL | Status: DC

## 2013-11-28 MED ORDER — POTASSIUM CHLORIDE CRYS ER 20 MEQ PO TBCR: 20.0000 meq | EXTENDED_RELEASE_TABLET | Freq: Every day | ORAL | Status: DC

## 2013-11-28 MED ORDER — METFORMIN HCL ER 500 MG PO TB24: 500.0000 mg | ORAL_TABLET | Freq: Two times a day (BID) | ORAL | Status: DC

## 2013-11-28 MED ORDER — METOPROLOL SUCCINATE ER 50 MG PO TB24: 50.0000 mg | ORAL_TABLET | Freq: Every day | ORAL | Status: DC

## 2013-11-28 MED ORDER — INSULIN GLARGINE 100 UNIT/ML ~~LOC~~ SOLN: 25.0000 [IU] | Freq: Every day | SUBCUTANEOUS | Status: DC

## 2013-11-28 MED ORDER — SERTRALINE HCL 100 MG PO TABS: 100.0000 mg | ORAL_TABLET | Freq: Every day | ORAL | Status: DC

## 2013-11-28 NOTE — Telephone Encounter (Signed)
Left a voicemail for patient to return our call. 

## 2013-11-28 NOTE — Progress Notes (Signed)
Patient ID: [name], female   DOB: [birth year], 48 y.o.   MRN: [id number]   [name], is a 48 y.o. female  VOZ:[id number]  [id number]  DOB - 12/09/1965  Chief Complaint  Patient presents with  . Diabetes        Subjective:   [name] is a 48 y.o. female here today for a follow up visit. Patient is known to have hypertension, diabetes, bipolar disorder, generalized anxiety as well as morbid obesity, here today for medication refills. She is present in the clinic with her mom, they will be traveling back to [place] in about 2 weeks and she need refill for 3 months. She has no complaint. She did not talk much during this visit but her mom was doing most of the talking, according to the mother, she is compliant with her medications But she is frustrated with life because she's not able to get back to her profession since relocating to the [place] from [place] in [place]. 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 She is on medications for anxiety and depression.   Patient has No headache, No chest pain, No abdominal pain - No Nausea, No new weakness tingling or numbness, No Cough - SOB.  Problem  Essential Hypertension, Benign  Htn (Hypertension)  Diabetes    ALLERGIES: Allergies  Allergen Reactions  . Acetaminophen     Fits as a child "seizures-like"  . Caffeine     Tense, anxiety, increased urination  . Lisinopril     Rash with lisinopril; but fosinopril is ok per patient    PAST MEDICAL HISTORY: Past Medical History  Diagnosis Date  . Insulin dependent type 2 diabetes mellitus, uncontrolled   . Chronic diastolic CHF (congestive heart failure)   . Morbid obesity with BMI of 50.0-59.9, adult   . Bipolar disease, chronic   . History of thyrotoxicosis   . OSA (obstructive sleep apnea) 03/06/2011  . Chest pain     a. 2012 Myoview: EF 63%, no isch/infarct  . Chronic diastolic congestive heart failure 07/23/2011  . Hypertensive heart disease 10/18/2013     MEDICATIONS AT HOME: Prior to Admission medications   Medication Sig Start Date End Date Taking? Authorizing Provider  albuterol (PROVENTIL HFA;VENTOLIN HFA) 108 (90 BASE) MCG/ACT inhaler Inhale 1-2 puffs into the lungs every 6 (six) hours as needed for wheezing or shortness of breath. 09/07/13  Yes Ripudeep Krystal Eaton, MD  ARIPiprazole (ABILIFY) 5 MG tablet Take 1 tablet (5 mg total) by mouth at bedtime. 11/07/13  Yes Alexander Parks Ranger, DO  clonazePAM (KLONOPIN) 0.5 MG tablet Take 1 tablet (0.5 mg total) by mouth 2 (two) times daily. 11/07/13  Yes Alexander Parks Ranger, DO  fosinopril (MONOPRIL) 40 MG tablet TAKE 1 TABLET (40 MG TOTAL) BY MOUTH DAILY.   Yes Jolaine Artist, MD  fosinopril (MONOPRIL) 40 MG tablet Take 1 tablet (40 mg total) by mouth daily. 11/28/13  Yes [name], MD  furosemide (LASIX) 40 MG tablet Take 2 tablets (80 mg total) by mouth 2 (two) times daily. 11/28/13  Yes [name], MD  insulin aspart (NOVOLOG) 100 UNIT/ML FlexPen Inject 15 Units into the skin 3 (three) times daily with meals. Patient uses sliding scale. Starts at 15 units with cbg over 120 11/10/13  Yes Reyne Dumas, MD  insulin glargine (LANTUS) 100 UNIT/ML injection Inject 0.25 mLs (25 Units total) into the skin at bedtime. 11/28/13  Yes [name], MD  metFORMIN (GLUCOPHAGE-XR) 500 MG 24 hr tablet Take 1  tablet (500 mg total) by mouth 2 (two) times daily. 11/28/13  Yes [name], MD  metoprolol succinate (TOPROL-XL) 50 MG 24 hr tablet Take 1 tablet (50 mg total) by mouth daily. Take with or immediately following a meal. 11/28/13  Yes [name], MD  potassium chloride (KLOR-CON) 10 MEQ CR tablet Take 4 tablets (40 mEq total) by mouth daily. 08/22/12  Yes Clanford Marisa Hua, MD  rosuvastatin (CRESTOR) 10 MG tablet Take 1 tablet (10 mg total) by mouth daily. 06/01/13  Yes Ripudeep Krystal Eaton, MD  sertraline (ZOLOFT) 100 MG tablet Take 1 tablet (100 mg total) by mouth daily. 11/28/13  Yes  [name], MD  traZODone (DESYREL) 100 MG tablet Take 1 tablet (100 mg total) by mouth at bedtime. 11/28/13  Yes [name], MD  potassium chloride SA (K-DUR,KLOR-CON) 20 MEQ tablet Take 1 tablet (20 mEq total) by mouth daily. 11/28/13   [name], MD     Objective:   Filed Vitals:   11/28/13 1259  BP: 156/103  Pulse: 66  Temp: 98.1 F (36.7 C)  TempSrc: Oral  Weight: 323 lb 12.8 oz (146.875 kg)  SpO2: 97%    Exam General appearance : Awake, alert, not in any distress. Speech Clear. Not toxic looking, morbidly obese, tearful HEENT: Atraumatic and Normocephalic, pupils equally reactive to light and accomodation Neck: supple, no JVD. No cervical lymphadenopathy.  Chest:Good air entry bilaterally, no added sounds  CVS: S1 S2 regular, no murmurs.  Abdomen: Bowel sounds present, Non tender and not distended with no gaurding, rigidity or rebound. Extremities: B/L Lower Ext shows no edema, both legs are warm to touch Neurology: Awake alert, and oriented X 3, CN II-XII intact, Non focal Skin:No Rash Wounds:N/A  Data Review  Assessment & Plan   1. Essential hypertension, benign Refill - fosinopril (MONOPRIL) 40 MG tablet; Take 1 tablet (40 mg total) by mouth daily.  Dispense: 90 tablet; Refill: 3 - furosemide (LASIX) 40 MG tablet; Take 2 tablets (80 mg total) by mouth 2 (two) times daily.  Dispense: 120 tablet; Refill: 0 - potassium chloride SA (K-DUR,KLOR-CON) 20 MEQ tablet; Take 1 tablet (20 mEq total) by mouth daily.  Dispense: 90 tablet; Refill: 3 - metoprolol succinate (TOPROL-XL) 50 MG 24 hr tablet; Take 1 tablet (50 mg total) by mouth daily. Take with or immediately following a meal.  Dispense: 90 tablet; Refill: 3  2. Morbid obesity Refill - metFORMIN (GLUCOPHAGE-XR) 500 MG 24 hr tablet; Take 1 tablet (500 mg total) by mouth 2 (two) times daily.  Dispense: 180 tablet; Refill: 3  3. Bipolar disorder, unspecified  - sertraline (ZOLOFT) 100 MG tablet;  Take 1 tablet (100 mg total) by mouth daily.  Dispense: 90 tablet; Refill: 3 - traZODone (DESYREL) 100 MG tablet; Take 1 tablet (100 mg total) by mouth at bedtime.  Dispense: 90 tablet; Refill: 3  4. Diabetes  - insulin glargine (LANTUS) 100 UNIT/ML injection; Inject 0.25 mLs (25 Units total) into the skin at bedtime.  Dispense: 3 vial; Refill: 3 - metFORMIN (GLUCOPHAGE-XR) 500 MG 24 hr tablet; Take 1 tablet (500 mg total) by mouth 2 (two) times daily.  Dispense: 180 tablet; Refill: 3  Patient was extensively counseled on nutrition and exercise I have discussed blood pressure goal, patient was encouraged to be compliant with medication and exercise regimen   Return in about 3 months (around 02/28/2014), or if symptoms worsen or fail to improve, for BP Check, Routine Follow Up.  The patient was given clear  instructions to go to ER or return to medical center if symptoms don't improve, worsen or new problems develop. The patient verbalized understanding. The patient was told to call to get lab results if they haven't heard anything in the next week.    Jeanann LewandowskyJEGEDE, Kyley Solow, MD, MHA, FACP, FAAP [facility] [place], [place] [phone]   11/28/2013, 5:12 PM

## 2013-11-28 NOTE — Telephone Encounter (Signed)
Message copied by Merit Maybee, [place] R on Tue Nov 28, 2013  9:41 AM ------      Message from: Allyson Sabal MD, Ascencion Dike      Created: Mon Nov 27, 2013  1:40 PM       Notify patient of the labs are normal ------

## 2013-11-28 NOTE — Patient Instructions (Signed)
Hypertension  As your heart beats, it forces blood through your arteries. This force is your blood pressure. If the pressure is too high, it is called hypertension (HTN) or high blood pressure. HTN is dangerous because you may have it and not know it. High blood pressure may mean that your heart has to work harder to pump blood. Your arteries may be narrow or stiff. The extra work puts you at risk for heart disease, stroke, and other problems.   Blood pressure consists of two numbers, a higher number over a lower, 110/72, for example. It is stated as "110 over 72." The ideal is below 120 for the top number (systolic) and under 80 for the bottom (diastolic). Write down your blood pressure today.  You should pay close attention to your blood pressure if you have certain conditions such as:  · Heart failure.  · Prior heart attack.  · Diabetes  · Chronic kidney disease.  · Prior stroke.  · Multiple risk factors for heart disease.  To see if you have HTN, your blood pressure should be measured while you are seated with your arm held at the level of the heart. It should be measured at least twice. A one-time elevated blood pressure reading (especially in the Emergency Department) does not mean that you need treatment. There may be conditions in which the blood pressure is different between your right and left arms. It is important to see your caregiver soon for a recheck.  Most people have essential hypertension which means that there is not a specific cause. This type of high blood pressure may be lowered by changing lifestyle factors such as:  · Stress.  · Smoking.  · Lack of exercise.  · Excessive weight.  · Drug/tobacco/alcohol use.  · Eating less salt.  Most people do not have symptoms from high blood pressure until it has caused damage to the body. Effective treatment can often prevent, delay or reduce that damage.  TREATMENT   When a cause has been identified, treatment for high blood pressure is directed at the  cause. There are a large number of medications to treat HTN. These fall into several categories, and your caregiver will help you select the medicines that are best for you. Medications may have side effects. You should review side effects with your caregiver.  If your blood pressure stays high after you have made lifestyle changes or started on medicines,   · Your medication(s) may need to be changed.  · Other problems may need to be addressed.  · Be certain you understand your prescriptions, and know how and when to take your medicine.  · Be sure to follow up with your caregiver within the time frame advised (usually within two weeks) to have your blood pressure rechecked and to review your medications.  · If you are taking more than one medicine to lower your blood pressure, make sure you know how and at what times they should be taken. Taking two medicines at the same time can result in blood pressure that is too low.  SEEK IMMEDIATE MEDICAL CARE IF:  · You develop a severe headache, blurred or changing vision, or confusion.  · You have unusual weakness or numbness, or a faint feeling.  · You have severe chest or abdominal pain, vomiting, or breathing problems.  MAKE SURE YOU:   · Understand these instructions.  · Will watch your condition.  · Will get help right away if you are not doing well   many health benefits, such as:  Improving your overall fitness, flexibility, and endurance.  Increasing your bone density.  Helping with weight control.  Decreasing your body fat.  Increasing your muscle strength.  Reducing stress and tension.  Improving your overall health. People with diabetes who exercise gain additional benefits because  exercise:  Reduces appetite.  Improves the body's use of blood sugar (glucose).  Helps lower or control blood glucose.  Decreases blood pressure.  Helps control blood lipids (such as cholesterol and triglycerides).  Improves the body's use of the hormone insulin by:  Increasing the body's insulin sensitivity.  Reducing the body's insulin needs.  Decreases the risk for heart disease because exercising:  Lowers cholesterol and triglycerides levels.  Increases the levels of good cholesterol (such as high-density lipoproteins [HDL]) in the body.  Lowers blood glucose levels. YOUR ACTIVITY PLAN  Choose an activity that you enjoy and set realistic goals. Your health care provider or diabetes educator can help you make an activity plan that works for you. You can break activities into 2 or 3 sessions throughout the day. Doing so is as good as one long session. Exercise ideas include:  Taking the dog for a walk.  Taking the stairs instead of the elevator.  Dancing to your favorite song.  Doing your favorite exercise with a friend. RECOMMENDATIONS FOR EXERCISING WITH TYPE 1 OR TYPE 2 DIABETES   Check your blood glucose before exercising. If blood glucose levels are greater than 240 mg/dL, check for urine ketones. Do not exercise if ketones are present.  Avoid injecting insulin into areas of the body that are going to be exercised. For example, avoid injecting insulin into:  The arms when playing tennis.  The legs when jogging.  Keep a record of:  Food intake before and after you exercise.  Expected peak times of insulin action.  Blood glucose levels before and after you exercise.  The type and amount of exercise you have done.  Review your records with your health care provider. Your health care provider will help you to develop guidelines for adjusting food intake and insulin amounts before and after exercising.  If you take insulin or oral hypoglycemic agents, watch  for signs and symptoms of hypoglycemia. They include:  Dizziness.  Shaking.  Sweating.  Chills.  Confusion.  Drink plenty of water while you exercise to prevent dehydration or heat stroke. Body water is lost during exercise and must be replaced.  Talk to your health care provider before starting an exercise program to make sure it is safe for you. Remember, almost any type of activity is better than none. Document Released: 12/05/2003 Document Revised: 05/17/2013 Document Reviewed: 02/21/2013 [facility] Patient Information 2014 [place].

## 2013-12-06 ENCOUNTER — Observation Stay (HOSPITAL_COMMUNITY)
Admission: EM | Admit: 2013-12-06 | Discharge: 2013-12-10 | Disposition: A | Discharge status: Home / Self Care | Payer: No Typology Code available for payment source | Attending: Internal Medicine | Admitting: Internal Medicine

## 2013-12-06 ENCOUNTER — Encounter (HOSPITAL_COMMUNITY): Payer: Self-pay | Admitting: Emergency Medicine

## 2013-12-06 ENCOUNTER — Ambulatory Visit: Payer: No Typology Code available for payment source | Admitting: Internal Medicine

## 2013-12-06 DIAGNOSIS — R079 Chest pain, unspecified: Secondary | ICD-10-CM | POA: Insufficient documentation

## 2013-12-06 DIAGNOSIS — R4182 Altered mental status, unspecified: Principal | ICD-10-CM | POA: Insufficient documentation

## 2013-12-06 DIAGNOSIS — I119 Hypertensive heart disease without heart failure: Secondary | ICD-10-CM

## 2013-12-06 DIAGNOSIS — I471 Supraventricular tachycardia, unspecified: Secondary | ICD-10-CM | POA: Diagnosis present

## 2013-12-06 DIAGNOSIS — I509 Heart failure, unspecified: Secondary | ICD-10-CM | POA: Insufficient documentation

## 2013-12-06 DIAGNOSIS — R479 Unspecified speech disturbances: Secondary | ICD-10-CM

## 2013-12-06 DIAGNOSIS — Z9119 Patient's noncompliance with other medical treatment and regimen: Secondary | ICD-10-CM | POA: Insufficient documentation

## 2013-12-06 DIAGNOSIS — Z794 Long term (current) use of insulin: Secondary | ICD-10-CM | POA: Insufficient documentation

## 2013-12-06 DIAGNOSIS — I10 Essential (primary) hypertension: Secondary | ICD-10-CM

## 2013-12-06 DIAGNOSIS — G4733 Obstructive sleep apnea (adult) (pediatric): Secondary | ICD-10-CM | POA: Insufficient documentation

## 2013-12-06 DIAGNOSIS — I272 Pulmonary hypertension, unspecified: Secondary | ICD-10-CM | POA: Diagnosis present

## 2013-12-06 DIAGNOSIS — I110 Hypertensive heart disease with heart failure: Secondary | ICD-10-CM | POA: Insufficient documentation

## 2013-12-06 DIAGNOSIS — E119 Type 2 diabetes mellitus without complications: Secondary | ICD-10-CM

## 2013-12-06 DIAGNOSIS — E872 Acidosis, unspecified: Secondary | ICD-10-CM

## 2013-12-06 DIAGNOSIS — R61 Generalized hyperhidrosis: Secondary | ICD-10-CM | POA: Insufficient documentation

## 2013-12-06 DIAGNOSIS — E66813 Obesity, class 3: Secondary | ICD-10-CM | POA: Diagnosis present

## 2013-12-06 DIAGNOSIS — Z91199 Patient's noncompliance with other medical treatment and regimen due to unspecified reason: Secondary | ICD-10-CM | POA: Insufficient documentation

## 2013-12-06 DIAGNOSIS — I2789 Other specified pulmonary heart diseases: Secondary | ICD-10-CM

## 2013-12-06 DIAGNOSIS — F319 Bipolar disorder, unspecified: Secondary | ICD-10-CM | POA: Diagnosis present

## 2013-12-06 DIAGNOSIS — R7309 Other abnormal glucose: Secondary | ICD-10-CM

## 2013-12-06 DIAGNOSIS — F309 Manic episode, unspecified: Secondary | ICD-10-CM | POA: Diagnosis present

## 2013-12-06 DIAGNOSIS — IMO0001 Reserved for inherently not codable concepts without codable children: Secondary | ICD-10-CM | POA: Insufficient documentation

## 2013-12-06 DIAGNOSIS — I498 Other specified cardiac arrhythmias: Secondary | ICD-10-CM

## 2013-12-06 DIAGNOSIS — E1165 Type 2 diabetes mellitus with hyperglycemia: Secondary | ICD-10-CM

## 2013-12-06 DIAGNOSIS — Z6841 Body Mass Index (BMI) 40.0 and over, adult: Secondary | ICD-10-CM | POA: Insufficient documentation

## 2013-12-06 DIAGNOSIS — I5032 Chronic diastolic (congestive) heart failure: Secondary | ICD-10-CM | POA: Insufficient documentation

## 2013-12-06 DIAGNOSIS — I5033 Acute on chronic diastolic (congestive) heart failure: Secondary | ICD-10-CM | POA: Diagnosis present

## 2013-12-06 HISTORY — DX: Supraventricular tachycardia, unspecified: I47.10

## 2013-12-06 HISTORY — DX: Supraventricular tachycardia: I47.1

## 2013-12-06 LAB — I-STAT ARTERIAL BLOOD GAS, ED
Acid-Base Excess: 4 mmol/L — ABNORMAL HIGH (ref 0.0–2.0)
BICARBONATE: 28.9 meq/L — AB (ref 20.0–24.0)
O2 Saturation: 91 %
PH ART: 7.423 (ref 7.350–7.450)
PO2 ART: 61 mmHg — AB (ref 80.0–100.0)
TCO2: 30 mmol/L (ref 0–100)
pCO2 arterial: 44.3 mmHg (ref 35.0–45.0)

## 2013-12-06 LAB — COMPREHENSIVE METABOLIC PANEL
ALT: 11 U/L (ref 0–35)
AST: 28 U/L (ref 0–37)
Albumin: 3.3 g/dL — ABNORMAL LOW (ref 3.5–5.2)
Alkaline Phosphatase: 111 U/L (ref 39–117)
BUN: 15 mg/dL (ref 6–23)
CALCIUM: 9.1 mg/dL (ref 8.4–10.5)
CO2: 24 mEq/L (ref 19–32)
Chloride: 101 mEq/L (ref 96–112)
Creatinine, Ser: 0.83 mg/dL (ref 0.50–1.10)
GFR calc Af Amer: >90 mL/min (ref >90)
GFR calc non Af Amer: 84 mL/min — ABNORMAL LOW (ref >90)
Glucose, Bld: 289 mg/dL — ABNORMAL HIGH (ref 70–99)
Potassium: 3.3 mEq/L — ABNORMAL LOW (ref 3.7–5.3)
SODIUM: 143 meq/L (ref 137–147)
TOTAL PROTEIN: 7.6 g/dL (ref 6.0–8.3)
Total Bilirubin: 0.4 mg/dL (ref 0.3–1.2)

## 2013-12-06 LAB — CBC WITH DIFFERENTIAL/PLATELET
BASOS ABS: 0 10*3/uL (ref 0.0–0.1)
Basophils Relative: 0 % (ref 0–1)
EOS ABS: 0 10*3/uL (ref 0.0–0.7)
EOS PCT: 0 % (ref 0–5)
HCT: 40.2 % (ref 36.0–46.0)
Hemoglobin: 13 g/dL (ref 12.0–15.0)
LYMPHS PCT: 18 % (ref 12–46)
Lymphs Abs: 1.6 10*3/uL (ref 0.7–4.0)
MCH: 26.7 pg (ref 26.0–34.0)
MCHC: 32.3 g/dL (ref 30.0–36.0)
MCV: 82.5 fL (ref 78.0–100.0)
Monocytes Absolute: 0.6 10*3/uL (ref 0.1–1.0)
Monocytes Relative: 7 % (ref 3–12)
Neutro Abs: 6.4 10*3/uL (ref 1.7–7.7)
Neutrophils Relative %: 74 % (ref 43–77)
PLATELETS: 255 10*3/uL (ref 150–400)
RBC: 4.87 MIL/uL (ref 3.87–5.11)
RDW: 16.2 % — ABNORMAL HIGH (ref 11.5–15.5)
WBC: 8.6 10*3/uL (ref 4.0–10.5)

## 2013-12-06 LAB — I-STAT TROPONIN, ED: Troponin i, poc: 0.02 ng/mL (ref 0.00–0.08)

## 2013-12-06 LAB — CBG MONITORING, ED: GLUCOSE-CAPILLARY: 280 mg/dL — AB (ref 70–99)

## 2013-12-06 LAB — PRO B NATRIURETIC PEPTIDE: Pro B Natriuretic peptide (BNP): 1540 pg/mL — ABNORMAL HIGH (ref 0–125)

## 2013-12-06 MED ORDER — METOPROLOL SUCCINATE ER 50 MG PO TB24
50.0000 mg | ORAL_TABLET | Freq: Every day | ORAL | Status: DC
  Administered 2013-12-07 – 2013-12-10 (×4): 50 mg via ORAL
  Filled 2013-12-06 (×4): qty 1

## 2013-12-06 MED ORDER — POTASSIUM CHLORIDE CRYS ER 20 MEQ PO TBCR
40.0000 meq | EXTENDED_RELEASE_TABLET | Freq: Once | ORAL | Status: AC
Start: 2013-12-06 — End: 2013-12-06
  Administered 2013-12-06: 40 meq via ORAL
  Filled 2013-12-06: qty 2

## 2013-12-06 MED ORDER — ATORVASTATIN CALCIUM 20 MG PO TABS
20.0000 mg | ORAL_TABLET | Freq: Every day | ORAL | Status: DC
  Administered 2013-12-07 – 2013-12-09 (×3): 20 mg via ORAL
  Filled 2013-12-06 (×4): qty 1

## 2013-12-06 MED ORDER — FUROSEMIDE 10 MG/ML IJ SOLN
40.0000 mg | Freq: Once | INTRAMUSCULAR | Status: AC
  Administered 2013-12-07: 40 mg via INTRAVENOUS
  Filled 2013-12-06 (×2): qty 4

## 2013-12-06 MED ORDER — ENOXAPARIN SODIUM 40 MG/0.4ML ~~LOC~~ SOLN
40.0000 mg | SUBCUTANEOUS | Status: DC
  Administered 2013-12-07 – 2013-12-08 (×2): 40 mg via SUBCUTANEOUS
  Filled 2013-12-06 (×4): qty 0.4

## 2013-12-06 MED ORDER — INSULIN ASPART 100 UNIT/ML ~~LOC~~ SOLN
0.0000 [IU] | Freq: Every day | SUBCUTANEOUS | Status: DC
Start: 2013-12-07 — End: 2013-12-10
  Administered 2013-12-07: 3 [IU] via SUBCUTANEOUS
  Filled 2013-12-06: qty 1

## 2013-12-06 MED ORDER — SERTRALINE HCL 100 MG PO TABS
100.0000 mg | ORAL_TABLET | Freq: Every day | ORAL | Status: DC
  Administered 2013-12-07 – 2013-12-10 (×4): 100 mg via ORAL
  Filled 2013-12-06 (×4): qty 1

## 2013-12-06 MED ORDER — FUROSEMIDE 10 MG/ML IJ SOLN: 60.0000 mg | Freq: Once | INTRAMUSCULAR | Status: DC

## 2013-12-06 MED ORDER — TRAZODONE HCL 50 MG PO TABS
50.0000 mg | ORAL_TABLET | Freq: Every evening | ORAL | Status: DC | PRN
  Filled 2013-12-06: qty 2

## 2013-12-06 MED ORDER — ONDANSETRON HCL 4 MG/2ML IJ SOLN
4.0000 mg | Freq: Four times a day (QID) | INTRAMUSCULAR | Status: DC | PRN
  Administered 2013-12-07: 4 mg via INTRAVENOUS
  Filled 2013-12-06: qty 2

## 2013-12-06 MED ORDER — ONDANSETRON HCL 4 MG PO TABS: 4.0000 mg | ORAL_TABLET | Freq: Four times a day (QID) | ORAL | Status: DC | PRN

## 2013-12-06 MED ORDER — SODIUM CHLORIDE 0.9 % IJ SOLN
3.0000 mL | Freq: Two times a day (BID) | INTRAMUSCULAR | Status: DC
  Administered 2013-12-07 – 2013-12-09 (×5): 3 mL via INTRAVENOUS

## 2013-12-06 MED ORDER — INSULIN GLARGINE 100 UNIT/ML ~~LOC~~ SOLN
25.0000 [IU] | Freq: Every day | SUBCUTANEOUS | Status: DC
  Administered 2013-12-07: 25 [IU] via SUBCUTANEOUS
  Filled 2013-12-06 (×2): qty 0.25

## 2013-12-06 MED ORDER — ARIPIPRAZOLE 5 MG PO TABS
5.0000 mg | ORAL_TABLET | Freq: Every day | ORAL | Status: DC
  Administered 2013-12-07 – 2013-12-08 (×3): 5 mg via ORAL
  Filled 2013-12-06 (×5): qty 1

## 2013-12-06 MED ORDER — FUROSEMIDE 80 MG PO TABS
80.0000 mg | ORAL_TABLET | Freq: Two times a day (BID) | ORAL | Status: DC
  Administered 2013-12-07 – 2013-12-10 (×7): 80 mg via ORAL
  Filled 2013-12-06 (×9): qty 1

## 2013-12-06 MED ORDER — ALBUTEROL SULFATE HFA 108 (90 BASE) MCG/ACT IN AERS: 1.0000 | INHALATION_SPRAY | Freq: Four times a day (QID) | RESPIRATORY_TRACT | Status: DC | PRN

## 2013-12-06 MED ORDER — METFORMIN HCL ER 500 MG PO TB24
500.0000 mg | ORAL_TABLET | Freq: Two times a day (BID) | ORAL | Status: DC
  Filled 2013-12-06 (×3): qty 1

## 2013-12-06 MED ORDER — FOSINOPRIL SODIUM 20 MG PO TABS
40.0000 mg | ORAL_TABLET | Freq: Every day | ORAL | Status: DC
  Filled 2013-12-06 (×2): qty 2

## 2013-12-06 MED ORDER — INSULIN ASPART 100 UNIT/ML ~~LOC~~ SOLN
0.0000 [IU] | Freq: Three times a day (TID) | SUBCUTANEOUS | Status: DC
  Administered 2013-12-07: 3 [IU] via SUBCUTANEOUS
  Administered 2013-12-07: 2 [IU] via SUBCUTANEOUS
  Administered 2013-12-08 – 2013-12-09 (×3): 3 [IU] via SUBCUTANEOUS
  Administered 2013-12-09 – 2013-12-10 (×2): 5 [IU] via SUBCUTANEOUS

## 2013-12-06 NOTE — H&P (Signed)
[facility] History and Physical  Patient: [name]  ZOX:[id number]  DOB: 25-Nov-1965  DOS: the patient was seen and examined on 12/06/2013 PCP: No PCP Per Patient  Chief Complaint: Diaphoresis  HPI: Danashia Landers is a 48 y.o. female with Past medical history of diabetes mellitus, chronic diastolic dysfunction, morbid obesity, obstructive sleep apnea noncompliant with CPAP, bipolar disorder, hypertension. The patient is coming from home. The history was obtained from both patient and her mother. As per the mother the patient was at her baseline until 3 days of sister last 3 days there have been more lethargy and poor appetite. Since last one week she appears to have mild worsening in her mental status with more confusion. With that she was seen by her psychiatric today we'll recommended to stop using clonazepam and reduce the dose of Zoloft from 100 mg to 50 mg and changed trazodone from scheduled to when necessary and recommended to increase Abilify 10 mg daily. After the psychiatric visit around 5 PM the patient was at home and the mother found that she was diaphoretic and cold, she checked her blood glucose and it was aborted to 20 and she gave the patient 10 units of NovoLog, after that patient continues to remain clammy and she checked her blood pressure and it was 100/60 with increased heart rate. She also check her blood sugar and it was of oh 150 but due to persistent diaphoresis she called EMS. EMS found the patient was in a regular narrow complex tachycardia with a heart rate of around 140, they gave her 20 mg IV Cardizem with which the patient converted back to normal sinus rhythm. Patient was brought to the hospital for further workup. Pt denies any fever, chills, headache, cough, chest pain, palpitation, nausea, vomiting, abdominal pain, diarrhea, constipation, active bleeding, burning urination, dizziness, pedal edema,  focal neurological deficit.  Patient had some orthopnea  but denies any PND. Patient has lost 65 pounds from November. Patient is compliant with her medications.  Review of Systems: as mentioned in the history of present illness.  A Comprehensive review of the other systems is negative.  Past Medical History  Diagnosis Date  . Insulin dependent type 2 diabetes mellitus, uncontrolled   . Chronic diastolic CHF (congestive heart failure)   . Morbid obesity with BMI of 50.0-59.9, adult   . Bipolar disease, chronic   . History of thyrotoxicosis   . OSA (obstructive sleep apnea) 03/06/2011  . Chest pain     a. 2012 Myoview: EF 63%, no isch/infarct  . Chronic diastolic congestive heart failure 07/23/2011  . Hypertensive heart disease 10/18/2013   History reviewed. No pertinent past surgical history. Social History:  reports that she has never smoked. She has never used smokeless tobacco. She reports that she does not drink alcohol or use illicit drugs. Partially Independent for most of her  ADL.  Allergies  Allergen Reactions  . Acetaminophen     Fits as a child "seizures-like"  . Caffeine     Tense, anxiety, increased urination  . Lisinopril     Rash with lisinopril; but fosinopril is ok per patient    Family History  Problem Relation Age of Onset  . Heart failure Father   . Stroke Father   . Heart disease Maternal Grandfather   . Heart disease Paternal Grandfather   . Hypertension Mother     Prior to Admission medications   Medication Sig Start Date End Date Taking? Authorizing Provider  albuterol (PROVENTIL HFA;VENTOLIN HFA)  108 (90 BASE) MCG/ACT inhaler Inhale 1-2 puffs into the lungs every 6 (six) hours as needed for wheezing or shortness of breath. 09/07/13  Yes Ripudeep Krystal Eaton, MD  ARIPiprazole (ABILIFY) 5 MG tablet Take 1 tablet (5 mg total) by mouth at bedtime. 11/07/13  Yes Alexander Parks Ranger, DO  clonazePAM (KLONOPIN) 0.5 MG tablet Take 1 tablet (0.5 mg total) by mouth 2 (two) times daily. 11/07/13  Yes Alexander  Parks Ranger, DO  fosinopril (MONOPRIL) 40 MG tablet Take 1 tablet (40 mg total) by mouth daily. 11/28/13  Yes [name], MD  furosemide (LASIX) 40 MG tablet Take 2 tablets (80 mg total) by mouth 2 (two) times daily. 11/28/13  Yes [name], MD  insulin aspart (NOVOLOG) 100 UNIT/ML injection Inject 10-15 Units into the skin 3 (three) times daily before meals. Sliding scale   Yes Historical Provider, MD  insulin glargine (LANTUS) 100 UNIT/ML injection Inject 0.25 mLs (25 Units total) into the skin at bedtime. 11/28/13  Yes [name], MD  metFORMIN (GLUCOPHAGE-XR) 500 MG 24 hr tablet Take 1 tablet (500 mg total) by mouth 2 (two) times daily. 11/28/13  Yes [name], MD  metoprolol succinate (TOPROL-XL) 50 MG 24 hr tablet Take 1 tablet (50 mg total) by mouth daily. Take with or immediately following a meal. 11/28/13  Yes [name], MD  potassium chloride (K-DUR,KLOR-CON) 10 MEQ tablet Take 10 mEq by mouth 2 (two) times daily.   Yes Historical Provider, MD  rosuvastatin (CRESTOR) 10 MG tablet Take 1 tablet (10 mg total) by mouth daily. 06/01/13  Yes Ripudeep Krystal Eaton, MD  sertraline (ZOLOFT) 100 MG tablet Take 1 tablet (100 mg total) by mouth daily. 11/28/13  Yes [name], MD  traZODone (DESYREL) 100 MG tablet Take 50-100 mg by mouth at bedtime.   Yes Historical Provider, MD    Physical Exam: Filed Vitals:   12/06/13 2200 12/06/13 2215 12/06/13 2230 12/06/13 2233  BP: 148/100 146/83 149/84   Pulse: 64 66 68   Temp:    98.7 F (37.1 C)  TempSrc:      Resp: 20  18   SpO2: 97% 97% 95%     General: Alert, Awake and Oriented to Time, Place and Person. Appear in mild distress Eyes: PERRL ENT: Oral Mucosa clear moist. Neck: Difficult to assess JVD Cardiovascular: S1 and S2 Present, no Murmur, Peripheral Pulses Present Respiratory: Bilateral Air entry equal and Decreased, faint basal Crackles, no wheezes Abdomen: Bowel Sound Present, Soft and Non tender Skin: No  Rash Extremities: Trace Pedal edema, no calf tenderness Neurologic: Grossly Unremarkable. Normal finger-nose-finger, normal bilateral strength, normal reflexes, no significant cranial nerve abnormality, normal sensation to light touch  Labs on Admission:  CBC:  Recent Labs Lab 12/06/13 2115  WBC 8.6  NEUTROABS 6.4  HGB 13.0  HCT 40.2  MCV 82.5  PLT 255    CMP     Component Value Date/Time   NA 143 12/06/2013 2115   K 3.3* 12/06/2013 2115   CL 101 12/06/2013 2115   CO2 24 12/06/2013 2115   GLUCOSE 289* 12/06/2013 2115   BUN 15 12/06/2013 2115   CREATININE 0.83 12/06/2013 2115   CREATININE 0.85 11/22/2013 1558   CALCIUM 9.1 12/06/2013 2115   PROT 7.6 12/06/2013 2115   ALBUMIN 3.3* 12/06/2013 2115   AST 28 12/06/2013 2115   ALT 11 12/06/2013 2115   ALKPHOS 111 12/06/2013 2115   BILITOT 0.4 12/06/2013 2115   GFRNONAA 84* 12/06/2013 2115   GFRAA >  90 12/06/2013 2115    No results found for this basename: LIPASE, AMYLASE,  in the last 168 hours No results found for this basename: AMMONIA,  in the last 168 hours  No results found for this basename: CKTOTAL, CKMB, CKMBINDEX, TROPONINI,  in the last 168 hours BNP (last 3 results)  Recent Labs  10/27/13 1316 11/02/13 1443 12/06/13 2115  PROBNP 945.0* 2403.0* 1540.0*    Radiological Exams on Admission: No results found.  EKG: Independently reviewed. normal EKG, normal sinus rhythm, nonspecific ST and T waves changes.  Assessment/Plan Principal Problem:   SVT (supraventricular tachycardia) Active Problems:   Bipolar disorder   Morbid obesity   OSA (obstructive sleep apnea)- non compliant with C-pap   IDDM (insulin dependent diabetes mellitus)   Acute on chronic diastolic congestive heart failure   Pulmonary HTN, moderate to severe   HTN (hypertension)   1. SVT (supraventricular tachycardia) Acute on chronic diastolic congestive heart failure Pulmonary hypertension The patient is presenting with complaints of diaphoresis  with tachycardia Patient has received one dose of IV Cardizem and she has converted to normal sinus rhythm currently her heart rate and blood pressure both appears stable. She was hypokalemic and has given replacement I would repeat her BMP and admit her to the hospital. I will give her one dose of IV Lasix as her proBNP is elevated and chest x-ray shows vascular congestion. Monitor on telemetry follow serial troponins and limited echocardiogram in the morning. Check TSH  2. History of diabetes mellitus Hyperglycemia Anion gap Patient has mildly elevated anion gap with hyperglycemia She does not appear to be in DKA Repeat serum glucose is 125 I would continue her on her home dose of Lantus and sliding scale Repeat another BMP Keep her on diabetic diet Monitor on telemetry  3. Bipolar disorder Continue prior dose of psychotropic medications as the family does not appear to have any prescriptions with that hand given the psychiatric today  4. Obstructive sleep apnea noncompliant with CPAP Pulmonary hypertension Diastolic dysfunction Monitor on telemetry oxygen as needed continue Lasix  5. Altered mental status On my examination the patient does not appear to have any focal neurological deficit, she also does not appear to have any speech deficit. She had mild cognitive imbalance for short-term memory but was able to identify family, herself, and the address, date of birth. Continue neurochecks every 4 hours Non-emergent CT of the head  DVT Prophylaxis: subcutaneous Heparin Nutrition: Cardiac and diabetic diet  Code Status: Full  Family Communication: Family was present at bedside, opportunity was given to ask question and all questions were answered satisfactorily at the time of interview. Disposition: Admitted to observation in telemetry unit.  Author: [name], MD [facility] Pager: [phone] 12/06/2013, 11:47 PM    If 7PM-7AM, please contact  night-coverage [email or web address] Password [email or web address]

## 2013-12-06 NOTE — ED Notes (Addendum)
Pt to ED via EMS for altered mental status for about a week now, worsen today after returning from psychiatric visit. Pt has difficulty speaking at arrival. Alert and oriented x2.

## 2013-12-06 NOTE — ED Notes (Signed)
Pt coming from home, family called EMS because pt has become increasingly altered over the last few days. Per Family: pt has not been acting her normal self for approx. 1 week, but much worse in the last 2 days. Pt was tachycardic en route, EMS administered 20mg  Cardizem with MD order.

## 2013-12-06 NOTE — ED Provider Notes (Signed)
CSN: [id number]     Arrival date & time 12/06/13  2025 History   First MD Initiated Contact with Patient 12/06/13 2036     Chief Complaint  Patient presents with  . Altered Mental Status   HPI  [name] is 48 y.o. female presented to the ED via EMS after an episode this afternoon when she reportedly was found being disoriented by her mother after they had returned from the patients psychiatry appointment. Patient has a history of bipolar with recent medication changes. Mother took patients BP and reported it to 100/60 and blood glucose of 220. Mother then gave patient 10 units of novolog and patient started sweating and appeared as if she was getting faint. Mother denies syncope in the patient. EMS was called and patient appeared to be in SVT, she was administered 20 mg cardizem and converted into NSR. Mother reports she feels her daughter has declined over the last week. She has not wanted to eat or speak. Patient has not complained of chest pain, sob or palpitations per mother. Patient and family are from [place]. Patient and family speaks [place].   Past Medical History  Diagnosis Date  . Insulin dependent type 2 diabetes mellitus, uncontrolled   . Chronic diastolic CHF (congestive heart failure)   . Morbid obesity with BMI of 50.0-59.9, adult   . Bipolar disease, chronic   . History of thyrotoxicosis   . OSA (obstructive sleep apnea) 03/06/2011  . Chest pain     a. 2012 Myoview: EF 63%, no isch/infarct  . Chronic diastolic congestive heart failure 07/23/2011  . Hypertensive heart disease 10/18/2013   History reviewed. No pertinent past surgical history. Family History  Problem Relation Age of Onset  . Heart failure Father   . Stroke Father   . Heart disease Maternal Grandfather   . Heart disease Paternal Grandfather   . Hypertension Mother    History  Substance Use Topics  . Smoking status: Never Smoker   . Smokeless tobacco: Never Used  . Alcohol Use: No   OB History   Grav Para Term Preterm Abortions TAB SAB Ect Mult Living                 Review of Systems  Constitutional: Positive for diaphoresis, activity change, appetite change, fatigue and unexpected weight change. Negative for chills.  HENT: Negative for congestion, ear discharge, ear pain, facial swelling, rhinorrhea and sore throat.   Eyes: Negative for pain, discharge and itching.  Respiratory: Negative for cough, choking, chest tightness, shortness of breath and wheezing.   Cardiovascular: Negative for chest pain, palpitations and leg swelling.  Gastrointestinal: Negative for nausea, vomiting, diarrhea, constipation and abdominal distention.  Genitourinary: Negative for dysuria, urgency, frequency and flank pain.  Musculoskeletal: Negative for arthralgias and back pain.  Neurological: Positive for dizziness, speech difficulty, weakness and numbness. Negative for tremors, seizures and syncope.   Allergies  Acetaminophen; Caffeine; and Lisinopril  Home Medications   Current Outpatient Rx  Name  Route  Sig  Dispense  Refill  . albuterol (PROVENTIL HFA;VENTOLIN HFA) 108 (90 BASE) MCG/ACT inhaler   Inhalation   Inhale 1-2 puffs into the lungs every 6 (six) hours as needed for wheezing or shortness of breath.   1 Inhaler   3   . ARIPiprazole (ABILIFY) 5 MG tablet   Oral   Take 1 tablet (5 mg total) by mouth at bedtime.   30 tablet   0   . clonazePAM (KLONOPIN) 0.5 MG tablet  Oral   Take 1 tablet (0.5 mg total) by mouth 2 (two) times daily.   60 tablet   0   . fosinopril (MONOPRIL) 40 MG tablet   Oral   Take 1 tablet (40 mg total) by mouth daily.   90 tablet   3   . furosemide (LASIX) 40 MG tablet   Oral   Take 2 tablets (80 mg total) by mouth 2 (two) times daily.   120 tablet   0   . insulin aspart (NOVOLOG) 100 UNIT/ML injection   Subcutaneous   Inject 10-15 Units into the skin 3 (three) times daily before meals. Sliding scale         . insulin glargine (LANTUS) 100  UNIT/ML injection   Subcutaneous   Inject 0.25 mLs (25 Units total) into the skin at bedtime.   3 vial   3   . metFORMIN (GLUCOPHAGE-XR) 500 MG 24 hr tablet   Oral   Take 1 tablet (500 mg total) by mouth 2 (two) times daily.   180 tablet   3   . metoprolol succinate (TOPROL-XL) 50 MG 24 hr tablet   Oral   Take 1 tablet (50 mg total) by mouth daily. Take with or immediately following a meal.   90 tablet   3   . potassium chloride (K-DUR,KLOR-CON) 10 MEQ tablet   Oral   Take 10 mEq by mouth 2 (two) times daily.         . rosuvastatin (CRESTOR) 10 MG tablet   Oral   Take 1 tablet (10 mg total) by mouth daily.   90 tablet   3   . sertraline (ZOLOFT) 100 MG tablet   Oral   Take 1 tablet (100 mg total) by mouth daily.   90 tablet   3   . traZODone (DESYREL) 100 MG tablet   Oral   Take 50-100 mg by mouth at bedtime.          BP 149/84  Pulse 68  Temp(Src) 98.7 F (37.1 C) (Oral)  Resp 18  SpO2 95% Physical Exam Gen:  NAD. Moving around on bed.  HEENT: AT. [place]. Bilateral TM visualized and normal in appearance. Bilateral eyes without injections or icterus. MMM. Bilateral nares with mild erythema. Throat without erythema or exudates.  CV: RRR. No murmur.  Chest: CTAB, no wheeze or crackles Abd: Soft. Morbidly obese. NTND. BS present. No Masses palpated.  Ext: No erythema. No edema.  Skin: No  rashes, purpura or petechiae.  Neuro:  PERLA. EOMi. Alert. Grossly intact. ? Oriented with not speaking.  Psych: anxious. Appears to have difficulty making words. Then speaks another language, although she speaks [place]. Psychomotor agitation present. Uncertain of cognition or though content, she will look at her mother and brother to speak for her.   ED Course  Procedures (including critical care time) Labs Review Labs Reviewed  CBC WITH DIFFERENTIAL - Abnormal; Notable for the following:    RDW 16.2 (*)    All other components within normal limits  COMPREHENSIVE  METABOLIC PANEL - Abnormal; Notable for the following:    Potassium 3.3 (*)    Glucose, Bld 289 (*)    Albumin 3.3 (*)    GFR calc non Af Amer 84 (*)    All other components within normal limits  PRO B NATRIURETIC PEPTIDE - Abnormal; Notable for the following:    Pro B Natriuretic peptide (BNP) 1540.0 (*)    All other components within normal limits  CBG MONITORING, ED - Abnormal; Notable for the following:    Glucose-Capillary 280 (*)    All other components within normal limits  LACTIC ACID, PLASMA  I-STAT TROPOININ, ED   Imaging Review No results found.   EKG Interpretation   Date/Time:  Wednesday December 06 2013 20:37:22 EDT Ventricular Rate:  76 PR Interval:  149 QRS Duration: 85 QT Interval:  400 QTC Calculation: 450 R Axis:   110 Text Interpretation:  Sinus rhythm Poor R wave progression Abnormal ekg  Confirmed by BEATON  MD, ROBERT ([id number]) on 12/06/2013 8:43:42 PM      MDM   Final diagnoses:  Metabolic acidosis  SVT (supraventricular tachycardia)  Elevated glucose   Patient is normal sinus rhythm with poor R wave progression on EKG upon admission. Patient received cardizem and cardioverted from SVT to NSR in route via EMS. Labs resulted with anion gap of 18, CBG of 280. Her BNP improved from last admission to 1540.0 and her tropi stat normal. Lactic acid is in process. ABG obtained.  Patient does have a history of diabetes with poor control. Patient is likely in mild DKA, AG is 70, with patient's psychiatric history she will likely not manage her condition well in outpatient setting. [facility] consulted for metabolic acidosis and admitted under Dr. Marlowe Sax.     Ma Hillock, DO 12/07/13 [place], DO 12/26/13 1623

## 2013-12-07 ENCOUNTER — Observation Stay (HOSPITAL_COMMUNITY): Payer: No Typology Code available for payment source

## 2013-12-07 ENCOUNTER — Encounter (HOSPITAL_COMMUNITY): Payer: Self-pay | Admitting: *Deleted

## 2013-12-07 DIAGNOSIS — I5032 Chronic diastolic (congestive) heart failure: Secondary | ICD-10-CM

## 2013-12-07 DIAGNOSIS — I059 Rheumatic mitral valve disease, unspecified: Secondary | ICD-10-CM

## 2013-12-07 LAB — URINALYSIS, ROUTINE W REFLEX MICROSCOPIC
GLUCOSE, UA: NEGATIVE mg/dL
Hgb urine dipstick: NEGATIVE
KETONES UR: 15 mg/dL — AB
Nitrite: NEGATIVE
PH: 5.5 (ref 5.0–8.0)
Protein, ur: 100 mg/dL — AB
SPECIFIC GRAVITY, URINE: 1.029 (ref 1.005–1.030)
Urobilinogen, UA: 0.2 mg/dL (ref 0.0–1.0)

## 2013-12-07 LAB — COMPREHENSIVE METABOLIC PANEL
ALT: 9 U/L (ref 0–35)
AST: 24 U/L (ref 0–37)
Albumin: 3.2 g/dL — ABNORMAL LOW (ref 3.5–5.2)
Alkaline Phosphatase: 99 U/L (ref 39–117)
BILIRUBIN TOTAL: 0.4 mg/dL (ref 0.3–1.2)
BUN: 13 mg/dL (ref 6–23)
CHLORIDE: 102 meq/L (ref 96–112)
CO2: 28 meq/L (ref 19–32)
CREATININE: 0.76 mg/dL (ref 0.50–1.10)
Calcium: 8.9 mg/dL (ref 8.4–10.5)
GLUCOSE: 187 mg/dL — AB (ref 70–99)
Potassium: 3.8 mEq/L (ref 3.7–5.3)
Sodium: 142 mEq/L (ref 137–147)
Total Protein: 6.8 g/dL (ref 6.0–8.3)

## 2013-12-07 LAB — BASIC METABOLIC PANEL
BUN: 14 mg/dL (ref 6–23)
CHLORIDE: 102 meq/L (ref 96–112)
CO2: 24 mEq/L (ref 19–32)
CREATININE: 0.73 mg/dL (ref 0.50–1.10)
Calcium: 9.4 mg/dL (ref 8.4–10.5)
GFR calc non Af Amer: >90 mL/min (ref >90)
Glucose, Bld: 143 mg/dL — ABNORMAL HIGH (ref 70–99)
Potassium: 4 mEq/L (ref 3.7–5.3)
Sodium: 144 mEq/L (ref 137–147)

## 2013-12-07 LAB — CBG MONITORING, ED: Glucose-Capillary: 127 mg/dL — ABNORMAL HIGH (ref 70–99)

## 2013-12-07 LAB — CBC
HCT: 38.6 % (ref 36.0–46.0)
Hemoglobin: 12.2 g/dL (ref 12.0–15.0)
MCH: 26.2 pg (ref 26.0–34.0)
MCHC: 31.6 g/dL (ref 30.0–36.0)
MCV: 83 fL (ref 78.0–100.0)
PLATELETS: 246 10*3/uL (ref 150–400)
RBC: 4.65 MIL/uL (ref 3.87–5.11)
RDW: 16.4 % — ABNORMAL HIGH (ref 11.5–15.5)
WBC: 8.2 10*3/uL (ref 4.0–10.5)

## 2013-12-07 LAB — GLUCOSE, CAPILLARY
Glucose-Capillary: 198 mg/dL — ABNORMAL HIGH (ref 70–99)
Glucose-Capillary: 156 mg/dL — ABNORMAL HIGH (ref 70–99)
Glucose-Capillary: 141 mg/dL — ABNORMAL HIGH (ref 70–99)
Glucose-Capillary: 282 mg/dL — ABNORMAL HIGH (ref 70–99)

## 2013-12-07 LAB — URINE MICROSCOPIC-ADD ON

## 2013-12-07 LAB — PROTIME-INR
INR: 1.2 (ref 0.00–1.49)
PROTHROMBIN TIME: 14.9 s (ref 11.6–15.2)

## 2013-12-07 LAB — TSH: TSH: 3.544 u[IU]/mL (ref 0.350–4.500)

## 2013-12-07 LAB — TROPONIN I

## 2013-12-07 LAB — LACTIC ACID, PLASMA: Lactic Acid, Venous: 0.9 mmol/L (ref 0.5–2.2)

## 2013-12-07 MED ORDER — FOSINOPRIL SODIUM 20 MG PO TABS
40.0000 mg | ORAL_TABLET | Freq: Every day | ORAL | Status: DC
  Filled 2013-12-07 (×2): qty 2

## 2013-12-07 MED ORDER — HYDRALAZINE HCL 20 MG/ML IJ SOLN
10.0000 mg | Freq: Four times a day (QID) | INTRAMUSCULAR | Status: DC | PRN
  Administered 2013-12-07: 10 mg via INTRAVENOUS
  Filled 2013-12-07: qty 1

## 2013-12-07 MED ORDER — TRAZODONE HCL 50 MG PO TABS
50.0000 mg | ORAL_TABLET | Freq: Every evening | ORAL | Status: DC | PRN
  Administered 2013-12-07 – 2013-12-08 (×2): 50 mg via ORAL
  Filled 2013-12-07 (×2): qty 1

## 2013-12-07 MED ORDER — ALBUTEROL SULFATE (2.5 MG/3ML) 0.083% IN NEBU: 2.5000 mg | INHALATION_SOLUTION | Freq: Four times a day (QID) | RESPIRATORY_TRACT | Status: DC | PRN

## 2013-12-07 NOTE — ED Notes (Signed)
[facility] phlebotomy tech notified.

## 2013-12-07 NOTE — Progress Notes (Signed)
[facility] PROGRESS NOTE  [name] NID:[id number] DOB: Mar 23, 1966 DOA: 12/06/2013 PCP: No PCP Per Patient  Assessment/Plan: 1. AMS/Expressive aphasia - will check MRI to r/o CVA - could be related to Bipolar d/o vs meds  2. DM -lantus, SSI  3. HTN -stable, continue Toprol  4. Bipolar disorder -resume Abilify, Zoloft, trazodone QHS PRN  5. Chronic diastolic CHF -stable, continue PO lasix  DVT proph: Lovenox  Code Status: Full Code Family Communication: d/w mother at bedside Disposition Plan: Home tomorrow   HPI/Subjective: Mother still reports expressive aphasia, for the last week  Objective: Filed Vitals:   12/07/13 0611  BP: 139/71  Pulse: 60  Temp: 98.7 F (37.1 C)  Resp: 20    Intake/Output Summary (Last 24 hours) at 12/07/13 1301 Last data filed at 12/07/13 0800  Gross per 24 hour  Intake    240 ml  Output      0 ml  Net    240 ml   Filed Weights   12/07/13 0147  Weight: 139.39 kg (307 lb 4.8 oz)    Exam:   General: Alert, awake, oriented to self, still with expressive aphasia  Cardiovascular: S1S2/RRR  Respiratory: CTAB  Abdomen: soft, NT, BS present  Musculoskeletal: no edema c/c  Neuro: no localising signs  Data Reviewed: Basic Metabolic Panel:  Recent Labs Lab 12/06/13 2115 12/06/13 2354 12/07/13 0533  NA 143 144 142  K 3.3* 4.0 3.8  CL 101 102 102  CO2 24 24 28   GLUCOSE 289* 143* 187*  BUN 15 14 13   CREATININE 0.83 0.73 0.76  CALCIUM 9.1 9.4 8.9   Liver Function Tests:  Recent Labs Lab 12/06/13 2115 12/07/13 0533  AST 28 24  ALT 11 9  ALKPHOS 111 99  BILITOT 0.4 0.4  PROT 7.6 6.8  ALBUMIN 3.3* 3.2*   No results found for this basename: LIPASE, AMYLASE,  in the last 168 hours No results found for this basename: AMMONIA,  in the last 168 hours CBC:  Recent Labs Lab 12/06/13 2115 12/07/13 0533  WBC 8.6 8.2  NEUTROABS 6.4  --   HGB 13.0 12.2  HCT 40.2 38.6  MCV 82.5 83.0  PLT 255 246    Cardiac Enzymes:  Recent Labs Lab 12/06/13 2354 12/07/13 0533  TROPONINI <0.30 <0.30   BNP (last 3 results)  Recent Labs  10/27/13 1316 11/02/13 1443 12/06/13 2115  PROBNP 945.0* 2403.0* 1540.0*   CBG:  Recent Labs Lab 12/06/13 2050 12/07/13 0053 12/07/13 0230 12/07/13 0608 12/07/13 1134  GLUCAP 280* 127* 198* 156* 141*    No results found for this or any previous visit (from the past 240 hour(s)).   Studies: Ct Head Wo Contrast  12/07/2013   CLINICAL DATA Altered mental status  EXAM CT HEAD WITHOUT CONTRAST  TECHNIQUE Contiguous axial images were obtained from the base of the skull through the vertex without intravenous contrast.  COMPARISON 12/21/2010  FINDINGS Despite 2 attempts, images through the inferior temporal lobe/posterior fossa degraded by motion. Within this limitation, no definite CT evidence of an acute infarction. Mild periventricular and subcortical white matter hypodensities are similar to prior, a nonspecific finding often seen in the setting of chronic microangiopathic change. No intraparenchymal hemorrhage, mass, mass effect, or abnormal extra-axial fluid collection. Partially imaged paranasal sinuses suggest sulcal thickening, polyps, or mucous retention cysts. Mastoid air cells are predominantly clear.  IMPRESSION Images through the inferior temporal lobes and posterior fossa are degraded by motion despite 2 attempts. Otherwise, no acute  intracranial abnormality identified by CT.  SIGNATURE  Electronically Signed   By: Carlos Levering M.D.   On: 12/07/2013 05:53    Scheduled Meds: . ARIPiprazole  5 mg Oral QHS  . atorvastatin  20 mg Oral q1800  . enoxaparin (LOVENOX) injection  40 mg Subcutaneous Q24H  . fosinopril  40 mg Oral Daily  . furosemide  80 mg Oral BID  . insulin aspart  0-15 Units Subcutaneous TID WC  . insulin aspart  0-5 Units Subcutaneous QHS  . insulin glargine  25 Units Subcutaneous QHS  . metoprolol succinate  50 mg Oral Daily   . sertraline  100 mg Oral Daily  . sodium chloride  3 mL Intravenous Q12H   Continuous Infusions:  Antibiotics Given (last 72 hours)   None      Principal Problem:   SVT (supraventricular tachycardia) Active Problems:   Bipolar disorder   Morbid obesity   OSA (obstructive sleep apnea)- non compliant with C-pap   IDDM (insulin dependent diabetes mellitus)   Acute on chronic diastolic congestive heart failure   Pulmonary HTN, moderate to severe   HTN (hypertension)    Time spent: 73min    [name]  [facility] Pager [phone]. If 7PM-7AM, please contact night-coverage at [email or web address], password [facility] 12/07/2013, 1:01 PM  LOS: 1 day

## 2013-12-07 NOTE — Progress Notes (Signed)
  Echocardiogram 2D Echocardiogram has been performed.  [name] 12/07/2013, 12:13 PM

## 2013-12-07 NOTE — Progress Notes (Signed)
UR completed 

## 2013-12-07 NOTE — Care Management Note (Unsigned)
    Page 1 of 1   12/07/2013     3:59:34 PM   CARE MANAGEMENT NOTE 12/07/2013  Patient:  [name],[name]   Account Number:  [id number]  Date Initiated:  12/07/2013  Documentation initiated by:  Trixie Maclaren  Subjective/Objective Assessment:   PT ADM ON 12/06/13 WITH EXPRESSIVE APHASIA, BIPOLAR DISORDER.  PTA, PT INDEPENDENT, LIVES WITH FAMILY.     Action/Plan:   WILL FOLLOW FOR DC NEEDS AS PT PROGRESSES.  PT FOLLOWS FOR PCP AT [place].   Anticipated DC Date:  12/09/2013   Anticipated DC Plan:  [place]  CM consult      Choice offered to / List presented to:             Status of service:  In process, will continue to follow Medicare Important Message given?   (If response is "NO", the following Medicare IM given date fields will be blank) Date Medicare IM given:   Date Additional Medicare IM given:    Discharge Disposition:    Per UR Regulation:  Reviewed for med. necessity/level of care/duration of stay  If discussed at [place] of Stay Meetings, dates discussed:    Comments:

## 2013-12-07 NOTE — Progress Notes (Signed)
Patient transferred from ED with SVT and mental status changed. Patient denies any pain. Mother and brother by the bedside. VS stable. Heart rate 63.

## 2013-12-08 ENCOUNTER — Observation Stay (HOSPITAL_COMMUNITY): Payer: No Typology Code available for payment source

## 2013-12-08 DIAGNOSIS — F3130 Bipolar disorder, current episode depressed, mild or moderate severity, unspecified: Secondary | ICD-10-CM

## 2013-12-08 DIAGNOSIS — R479 Unspecified speech disturbances: Secondary | ICD-10-CM | POA: Diagnosis present

## 2013-12-08 DIAGNOSIS — R4789 Other speech disturbances: Secondary | ICD-10-CM

## 2013-12-08 LAB — GLUCOSE, CAPILLARY
GLUCOSE-CAPILLARY: 194 mg/dL — AB (ref 70–99)
Glucose-Capillary: 191 mg/dL — ABNORMAL HIGH (ref 70–99)
Glucose-Capillary: 247 mg/dL — ABNORMAL HIGH (ref 70–99)
Glucose-Capillary: 170 mg/dL — ABNORMAL HIGH (ref 70–99)

## 2013-12-08 MED ORDER — LORAZEPAM 2 MG/ML IJ SOLN
1.0000 mg | Freq: Once | INTRAMUSCULAR | Status: AC
  Administered 2013-12-08: 1 mg via INTRAVENOUS
  Filled 2013-12-08: qty 1

## 2013-12-08 MED ORDER — ASPIRIN 81 MG PO CHEW
81.0000 mg | CHEWABLE_TABLET | Freq: Every day | ORAL | Status: DC
  Administered 2013-12-08 – 2013-12-10 (×3): 81 mg via ORAL
  Filled 2013-12-08 (×3): qty 1

## 2013-12-08 MED ORDER — FOSINOPRIL SODIUM 20 MG PO TABS
40.0000 mg | ORAL_TABLET | Freq: Every day | ORAL | Status: DC
  Filled 2013-12-08: qty 2

## 2013-12-08 MED ORDER — LORAZEPAM 2 MG/ML IJ SOLN
1.0000 mg | Freq: Once | INTRAMUSCULAR | Status: AC
  Administered 2013-12-08: 1 mg via INTRAVENOUS

## 2013-12-08 MED ORDER — AMLODIPINE BESYLATE 10 MG PO TABS
10.0000 mg | ORAL_TABLET | Freq: Every day | ORAL | Status: DC
  Administered 2013-12-08 – 2013-12-10 (×3): 10 mg via ORAL
  Filled 2013-12-08 (×3): qty 1

## 2013-12-08 MED ORDER — INSULIN GLARGINE 100 UNIT/ML ~~LOC~~ SOLN
30.0000 [IU] | Freq: Every day | SUBCUTANEOUS | Status: DC
  Administered 2013-12-08 – 2013-12-09 (×2): 30 [IU] via SUBCUTANEOUS
  Filled 2013-12-08 (×3): qty 0.3

## 2013-12-08 MED ORDER — FOSINOPRIL SODIUM 40 MG PO TABS
40.0000 mg | ORAL_TABLET | Freq: Every day | ORAL | Status: DC
Start: 2013-12-08 — End: 2013-12-10
  Administered 2013-12-08 – 2013-12-10 (×3): 40 mg via ORAL
  Filled 2013-12-08 (×2): qty 1

## 2013-12-08 NOTE — Consult Note (Signed)
[facility] Face-to-Face Psychiatry Consult   Reason for Consult:  Bipolar disorder and medication management Referring Physician:  Dr. Fabiola Backer Bousquet is an 48 y.o. female. Total Time spent with patient: 45 minutes  Assessment: AXIS I:  Bipolar, Depressed AXIS II:  Deferred AXIS III:   Past Medical History  Diagnosis Date  . Insulin dependent type 2 diabetes mellitus, uncontrolled   . Chronic diastolic CHF (congestive heart failure)   . Morbid obesity with BMI of 50.0-59.9, adult   . Bipolar disease, chronic   . History of thyrotoxicosis   . OSA (obstructive sleep apnea) 03/06/2011  . Chest pain     a. 2012 Myoview: EF 63%, no isch/infarct  . Chronic diastolic congestive heart failure 07/23/2011  . Hypertensive heart disease 10/18/2013   AXIS IV:  economic problems, occupational problems, other psychosocial or environmental problems, problems related to social environment and problems with primary support group AXIS V:  41-50 serious symptoms  Plan:  No evidence of imminent risk to self or others at present.   Patient does not meet criteria for psychiatric inpatient admission. Supportive therapy provided about ongoing stressors. Continue current medication management Zoloft, trazodone and Abilify with out changes  May contact [phone] if needed further assistance  Subjective:   Valory Maugeri is a 48 y.o. female patient admitted with bipolar disorder.  HPI:  HPI: Patient was seen and chart reviewed. Patient mother was at bedside along with multiple family members during this visit. Patient was emotional and unable to provide linear and goal-directed history. Most of the history was obtained from the available medical records and the interview with patient mother. Patient was known to this provider from her previous hospitalization about a month ago for a similar condition. Patient mother reported she has been more depressed, lethargy and poor appetite.  Patient was seen by her psychiatric  today we'll recommended to stop using clonazepam and reduce the dose of Zoloft from 100 mg to 50 mg and changed trazodone from scheduled to when necessary and recommended to increase Abilify 10 mg daily. Patient denies current symptoms of depression, suicide and homicidal ideation, intention or plan.  Medical history: Marybell Robards is a 48 y.o. female with Past medical history of diabetes mellitus, chronic diastolic dysfunction, morbid obesity, obstructive sleep apnea noncompliant with CPAP, bipolar disorder, hypertension.  Patient patient was at home and the mother found that she was diaphoretic and cold, she checked her blood glucose and it was aborted to 20 and she gave the patient 10 units of NovoLog, after that patient continues to remain clammy and she checked her blood pressure and it was 100/60 with increased heart rate. She also check her blood sugar and it was of oh 150 but due to persistent diaphoresis.   Review of Systems: as mentioned in the history of present illness.  A Comprehensive review of the other systems is negative.   Past Psychiatric History: Past Medical History  Diagnosis Date  . Insulin dependent type 2 diabetes mellitus, uncontrolled   . Chronic diastolic CHF (congestive heart failure)   . Morbid obesity with BMI of 50.0-59.9, adult   . Bipolar disease, chronic   . History of thyrotoxicosis   . OSA (obstructive sleep apnea) 03/06/2011  . Chest pain     a. 2012 Myoview: EF 63%, no isch/infarct  . Chronic diastolic congestive heart failure 07/23/2011  . Hypertensive heart disease 10/18/2013    reports that she has never smoked. She has never used smokeless tobacco. She reports that  she does not drink alcohol or use illicit drugs. Family History  Problem Relation Age of Onset  . Heart failure Father   . Stroke Father   . Heart disease Maternal Grandfather   . Heart disease Paternal Grandfather   . Hypertension Mother      Living Arrangements: Parent;Other  relatives   Abuse/Neglect [facility]) Physical Abuse: Denies Verbal Abuse: Denies Sexual Abuse: Denies Allergies:   Allergies  Allergen Reactions  . Acetaminophen     Fits as a child "seizures-like"  . Caffeine     Tense, anxiety, increased urination  . Lisinopril     Rash with lisinopril; but fosinopril is ok per patient    Objective: Blood pressure 172/78, pulse 80, temperature 97.1 F (36.2 C), temperature source Oral, resp. rate 20, height '5\' 6"'  (1.676 m), weight 136.714 kg (301 lb 6.4 oz), SpO2 98.00%.Body mass index is 48.67 kg/(m^2). Results for orders placed during the hospital encounter of 12/06/13 (from the past 72 hour(s))  CBG MONITORING, ED     Status: Abnormal   Collection Time    12/06/13  8:50 PM      Result Value Ref Range   Glucose-Capillary 280 (*) 70 - 99 mg/dL  CBC WITH DIFFERENTIAL     Status: Abnormal   Collection Time    12/06/13  9:15 PM      Result Value Ref Range   WBC 8.6  4.0 - 10.5 K/uL   RBC 4.87  3.87 - 5.11 MIL/uL   Hemoglobin 13.0  12.0 - 15.0 g/dL   HCT 40.2  36.0 - 46.0 %   MCV 82.5  78.0 - 100.0 fL   MCH 26.7  26.0 - 34.0 pg   MCHC 32.3  30.0 - 36.0 g/dL   RDW 16.2 (*) 11.5 - 15.5 %   Platelets 255  150 - 400 K/uL   Neutrophils Relative % 74  43 - 77 %   Neutro Abs 6.4  1.7 - 7.7 K/uL   Lymphocytes Relative 18  12 - 46 %   Lymphs Abs 1.6  0.7 - 4.0 K/uL   Monocytes Relative 7  3 - 12 %   Monocytes Absolute 0.6  0.1 - 1.0 K/uL   Eosinophils Relative 0  0 - 5 %   Eosinophils Absolute 0.0  0.0 - 0.7 K/uL   Basophils Relative 0  0 - 1 %   Basophils Absolute 0.0  0.0 - 0.1 K/uL  COMPREHENSIVE METABOLIC PANEL     Status: Abnormal   Collection Time    12/06/13  9:15 PM      Result Value Ref Range   Sodium 143  137 - 147 mEq/L   Potassium 3.3 (*) 3.7 - 5.3 mEq/L   Chloride 101  96 - 112 mEq/L   CO2 24  19 - 32 mEq/L   Glucose, Bld 289 (*) 70 - 99 mg/dL   BUN 15  6 - 23 mg/dL   Creatinine, Ser 0.83  0.50 - 1.10 mg/dL   Calcium 9.1  8.4  - 10.5 mg/dL   Total Protein 7.6  6.0 - 8.3 g/dL   Albumin 3.3 (*) 3.5 - 5.2 g/dL   AST 28  0 - 37 U/L   ALT 11  0 - 35 U/L   Alkaline Phosphatase 111  39 - 117 U/L   Total Bilirubin 0.4  0.3 - 1.2 mg/dL   GFR calc non Af Amer 84 (*) >90 mL/min   GFR calc Af Amer >90  >  90 mL/min   Comment: (NOTE)     The eGFR has been calculated using the CKD EPI equation.     This calculation has not been validated in all clinical situations.     eGFR's persistently <90 mL/min signify possible Chronic Kidney     Disease.  PRO B NATRIURETIC PEPTIDE     Status: Abnormal   Collection Time    12/06/13  9:15 PM      Result Value Ref Range   Pro B Natriuretic peptide (BNP) 1540.0 (*) 0 - 125 pg/mL  I-STAT TROPOININ, ED     Status: None   Collection Time    12/06/13  9:21 PM      Result Value Ref Range   Troponin i, poc 0.02  0.00 - 0.08 ng/mL   Comment 3            Comment: Due to the release kinetics of cTnI,     a negative result within the first hours     of the onset of symptoms does not rule out     myocardial infarction with certainty.     If myocardial infarction is still suspected,     repeat the test at appropriate intervals.  I-STAT ARTERIAL BLOOD GAS, ED     Status: Abnormal   Collection Time    12/06/13 11:18 PM      Result Value Ref Range   pH, Arterial 7.423  7.350 - 7.450   pCO2 arterial 44.3  35.0 - 45.0 mmHg   pO2, Arterial 61.0 (*) 80.0 - 100.0 mmHg   Bicarbonate 28.9 (*) 20.0 - 24.0 mEq/L   TCO2 30  0 - 100 mmol/L   O2 Saturation 91.0     Acid-Base Excess 4.0 (*) 0.0 - 2.0 mmol/L   Patient temperature 98.6 F     Collection site RADIAL, ALLEN'S TEST ACCEPTABLE     Drawn by Operator     Sample type ARTERIAL    TSH     Status: None   Collection Time    12/06/13 11:54 PM      Result Value Ref Range   TSH 3.544  0.350 - 4.500 uIU/mL   Comment: Performed at [facility]  TROPONIN I     Status: None   Collection Time    12/06/13 11:54 PM      Result Value Ref  Range   Troponin I <0.30  <0.30 ng/mL   Comment:            Due to the release kinetics of cTnI,     a negative result within the first hours     of the onset of symptoms does not rule out     myocardial infarction with certainty.     If myocardial infarction is still suspected,     repeat the test at appropriate intervals.  LACTIC ACID, PLASMA     Status: None   Collection Time    12/06/13 11:54 PM      Result Value Ref Range   Lactic Acid, Venous 0.9  0.5 - 2.2 mmol/L  BASIC METABOLIC PANEL     Status: Abnormal   Collection Time    12/06/13 11:54 PM      Result Value Ref Range   Sodium 144  137 - 147 mEq/L   Potassium 4.0  3.7 - 5.3 mEq/L   Comment: DELTA CHECK NOTED   Chloride 102  96 - 112 mEq/L   CO2 24  19 -  32 mEq/L   Glucose, Bld 143 (*) 70 - 99 mg/dL   BUN 14  6 - 23 mg/dL   Creatinine, Ser 0.73  0.50 - 1.10 mg/dL   Calcium 9.4  8.4 - 10.5 mg/dL   GFR calc non Af Amer >90  >90 mL/min   GFR calc Af Amer >90  >90 mL/min   Comment: (NOTE)     The eGFR has been calculated using the CKD EPI equation.     This calculation has not been validated in all clinical situations.     eGFR's persistently <90 mL/min signify possible Chronic Kidney     Disease.  URINALYSIS, ROUTINE W REFLEX MICROSCOPIC     Status: Abnormal   Collection Time    12/07/13 12:41 AM      Result Value Ref Range   Color, Urine AMBER (*) YELLOW   Comment: BIOCHEMICALS MAY BE AFFECTED BY COLOR   APPearance TURBID (*) CLEAR   Specific Gravity, Urine 1.029  1.005 - 1.030   pH 5.5  5.0 - 8.0   Glucose, UA NEGATIVE  NEGATIVE mg/dL   Hgb urine dipstick NEGATIVE  NEGATIVE   Bilirubin Urine SMALL (*) NEGATIVE   Ketones, ur 15 (*) NEGATIVE mg/dL   Protein, ur 100 (*) NEGATIVE mg/dL   Urobilinogen, UA 0.2  0.0 - 1.0 mg/dL   Nitrite NEGATIVE  NEGATIVE   Leukocytes, UA SMALL (*) NEGATIVE  URINE MICROSCOPIC-ADD ON     Status: Abnormal   Collection Time    12/07/13 12:41 AM      Result Value Ref Range    Squamous Epithelial / LPF FEW (*) RARE   WBC, UA 3-6  <3 WBC/hpf   RBC / HPF 0-2  <3 RBC/hpf   Bacteria, UA RARE  RARE   Casts HYALINE CASTS (*) NEGATIVE   Urine-Other AMORPHOUS URATES/PHOSPHATES    CBG MONITORING, ED     Status: Abnormal   Collection Time    12/07/13 12:53 AM      Result Value Ref Range   Glucose-Capillary 127 (*) 70 - 99 mg/dL  GLUCOSE, CAPILLARY     Status: Abnormal   Collection Time    12/07/13  2:30 AM      Result Value Ref Range   Glucose-Capillary 198 (*) 70 - 99 mg/dL  COMPREHENSIVE METABOLIC PANEL     Status: Abnormal   Collection Time    12/07/13  5:33 AM      Result Value Ref Range   Sodium 142  137 - 147 mEq/L   Potassium 3.8  3.7 - 5.3 mEq/L   Chloride 102  96 - 112 mEq/L   CO2 28  19 - 32 mEq/L   Glucose, Bld 187 (*) 70 - 99 mg/dL   BUN 13  6 - 23 mg/dL   Creatinine, Ser 0.76  0.50 - 1.10 mg/dL   Calcium 8.9  8.4 - 10.5 mg/dL   Total Protein 6.8  6.0 - 8.3 g/dL   Albumin 3.2 (*) 3.5 - 5.2 g/dL   AST 24  0 - 37 U/L   ALT 9  0 - 35 U/L   Alkaline Phosphatase 99  39 - 117 U/L   Total Bilirubin 0.4  0.3 - 1.2 mg/dL   GFR calc non Af Amer >90  >90 mL/min   GFR calc Af Amer >90  >90 mL/min   Comment: (NOTE)     The eGFR has been calculated using the CKD EPI equation.  This calculation has not been validated in all clinical situations.     eGFR's persistently <90 mL/min signify possible Chronic Kidney     Disease.  CBC     Status: Abnormal   Collection Time    12/07/13  5:33 AM      Result Value Ref Range   WBC 8.2  4.0 - 10.5 K/uL   RBC 4.65  3.87 - 5.11 MIL/uL   Hemoglobin 12.2  12.0 - 15.0 g/dL   HCT 38.6  36.0 - 46.0 %   MCV 83.0  78.0 - 100.0 fL   MCH 26.2  26.0 - 34.0 pg   MCHC 31.6  30.0 - 36.0 g/dL   RDW 16.4 (*) 11.5 - 15.5 %   Platelets 246  150 - 400 K/uL  PROTIME-INR     Status: None   Collection Time    12/07/13  5:33 AM      Result Value Ref Range   Prothrombin Time 14.9  11.6 - 15.2 seconds   INR 1.20  0.00 - 1.49   TROPONIN I     Status: None   Collection Time    12/07/13  5:33 AM      Result Value Ref Range   Troponin I <0.30  <0.30 ng/mL   Comment:            Due to the release kinetics of cTnI,     a negative result within the first hours     of the onset of symptoms does not rule out     myocardial infarction with certainty.     If myocardial infarction is still suspected,     repeat the test at appropriate intervals.  GLUCOSE, CAPILLARY     Status: Abnormal   Collection Time    12/07/13  6:08 AM      Result Value Ref Range   Glucose-Capillary 156 (*) 70 - 99 mg/dL  GLUCOSE, CAPILLARY     Status: Abnormal   Collection Time    12/07/13 11:34 AM      Result Value Ref Range   Glucose-Capillary 141 (*) 70 - 99 mg/dL   Comment 1 Notify RN    GLUCOSE, CAPILLARY     Status: Abnormal   Collection Time    12/07/13  9:44 PM      Result Value Ref Range   Glucose-Capillary 282 (*) 70 - 99 mg/dL   Comment 1 Documented in Chart     Comment 2 Notify RN    GLUCOSE, CAPILLARY     Status: Abnormal   Collection Time    12/08/13  6:20 AM      Result Value Ref Range   Glucose-Capillary 191 (*) 70 - 99 mg/dL   Comment 1 Documented in Chart     Comment 2 Notify RN     Labs are reviewed and are pertinent for hyperglycemia a.  Current Facility-Administered Medications  Medication Dose Route Frequency Provider Last Rate Last Dose  . albuterol (PROVENTIL) (2.5 MG/3ML) 0.083% nebulizer solution 2.5 mg  2.5 mg Nebulization Q6H PRN [name], MD      . amLODipine (NORVASC) tablet 10 mg  10 mg Oral Daily Domenic Polite, MD      . ARIPiprazole (ABILIFY) tablet 5 mg  5 mg Oral QHS [name], MD   5 mg at 12/07/13 2152  . aspirin chewable tablet 81 mg  81 mg Oral Daily Domenic Polite, MD   81 mg at 12/08/13 1059  .  atorvastatin (LIPITOR) tablet 20 mg  20 mg Oral q1800 [name], MD   20 mg at 12/07/13 1755  . enoxaparin (LOVENOX) injection 40 mg  40 mg Subcutaneous Q24H [name], MD   40 mg at  12/08/13 1058  . fosinopril (MONOPRIL) tablet 40 mg  40 mg Oral Daily Domenic Polite, MD      . furosemide (LASIX) tablet 80 mg  80 mg Oral BID [name], MD   80 mg at 12/08/13 1057  . hydrALAZINE (APRESOLINE) injection 10 mg  10 mg Intravenous Q6H PRN Domenic Polite, MD   10 mg at 12/07/13 1425  . insulin aspart (novoLOG) injection 0-15 Units  0-15 Units Subcutaneous TID WC [name], MD   3 Units at 12/08/13 0704  . insulin aspart (novoLOG) injection 0-5 Units  0-5 Units Subcutaneous QHS [name], MD   3 Units at 12/07/13 2156  . insulin glargine (LANTUS) injection 30 Units  30 Units Subcutaneous QHS Domenic Polite, MD      . metoprolol succinate (TOPROL-XL) 24 hr tablet 50 mg  50 mg Oral Daily [name], MD   50 mg at 12/08/13 1059  . ondansetron (ZOFRAN) tablet 4 mg  4 mg Oral Q6H PRN [name], MD       Or  . ondansetron (ZOFRAN) injection 4 mg  4 mg Intravenous Q6H PRN [name], MD   4 mg at 12/07/13 0057  . sertraline (ZOLOFT) tablet 100 mg  100 mg Oral Daily [name], MD   100 mg at 12/08/13 1059  . sodium chloride 0.9 % injection 3 mL  3 mL Intravenous Q12H [name], MD   3 mL at 12/08/13 0052  . traZODone (DESYREL) tablet 50 mg  50 mg Oral QHS PRN Domenic Polite, MD   50 mg at 12/07/13 2155    Psychiatric Specialty Exam: Physical Exam Full physical performed in Emergency Department. I have reviewed this assessment and concur with its findings.   Review of Systems  Neurological: Positive for tremors and speech change.  Psychiatric/Behavioral: Positive for depression and memory loss. The patient is nervous/anxious and has insomnia.   All other systems reviewed and are negative.    Blood pressure 172/78, pulse 80, temperature 97.1 F (36.2 C), temperature source Oral, resp. rate 20, height '5\' 6"'  (1.676 m), weight 136.714 kg (301 lb 6.4 oz), SpO2 98.00%.Body mass index is 48.67 kg/(m^2).  General Appearance: Guarded  Eye Contact::  Minimal  Speech:   Slurred  Volume:  Decreased  Mood:  Anxious and Depressed  Affect:  Depressed and Flat  Thought Process:  Goal Directed and Intact  Orientation:  Full (Time, Place, and Person)  Thought Content:  Rumination  Suicidal Thoughts:  No  Homicidal Thoughts:  No  Memory:  Immediate;   Fair  Judgement:  Fair  Insight:  Fair  Psychomotor Activity:  Psychomotor Retardation  Concentration:  Fair  Recall:  [employer] of Knowledge:Fair  Language: Fair  Akathisia:  NA  Handed:  Right  AIMS (if indicated):     Assets:  Communication Skills Desire for Improvement Leisure Time Physical Health Resilience Social Support  Sleep:      Musculoskeletal: Strength & Muscle Tone: within normal limits Gait & Station: normal Patient leans: N/A  Treatment Plan Summary: Daily contact with patient to assess and evaluate symptoms and progress in treatment Medication management  Kayelyn Lemon,JANARDHAHA R. 12/08/2013 11:18 AM

## 2013-12-08 NOTE — Progress Notes (Signed)
Pt given ativan prior to MRI.  Pt did not respond to questions during initial am assessment then began verbalizing concerns about medications and treatment. I asked MD to speak to pt as she wanted a Arabic speaking medical person to go to MRI with her. MD explained that was not possible and mother would accompany to MRI with medical staff.  Pt transported to MRI with RN and Advertising copywriter.  Pt refused to have MRI as she feels speech difficulty is not related to a stroke.  She insists nothing is wrong with her head just her heart.  I explained that heart rate and rhythm is back to normal and no evidence of issues at this time. Pt concerned with all medications being given to her.  Seems to know what she needs and does not need.  Pt resting with call bell within reach.  Will continue to monitor. Mother in room with pt. Payton Emerald, RN

## 2013-12-08 NOTE — Progress Notes (Addendum)
[facility] PROGRESS NOTE  [name] ZOX:[id number] DOB: Mar 18, 1966 DOA: 12/06/2013 PCP: No PCP Per Patient  Assessment/Plan: 1. AMS/Speech disturbance - would not cooperate to have MRI despite multiple attempts yesterday and today - i suspect this is related to her bipolar disease and not CVA but unable to concretely rule this out - her speech is fluent at times and with difficulty at others - could be related to Bipolar d/o vs psychotropic meds -will Ask Psychiatry to assist with management   2. DM -lantus, SSI  3. HTN -stable, continue Toprol  4. Bipolar disorder -on Abilify, Zoloft, trazodone QHS PRN -Psych eval requested  5. Chronic diastolic CHF -stable, continue PO lasix  6. Transient SVT -resolved -ECHo normal, tele NSR -continue toprol  DVT proph: Lovenox  Code Status: Full Code Family Communication: d/w mother at bedside Disposition Plan: Home pending psych eval   HPI/Subjective: Mother still reports expressive aphasia, for the last week, but intermittently normal when she is not talking about her self  Objective: Filed Vitals:   12/08/13 0541  BP: 180/82  Pulse:   Temp:   Resp:     Intake/Output Summary (Last 24 hours) at 12/08/13 0859 Last data filed at 12/07/13 1944  Gross per 24 hour  Intake    480 ml  Output    500 ml  Net    -20 ml   Filed Weights   12/07/13 0147 12/08/13 0534  Weight: 139.39 kg (307 lb 4.8 oz) 136.714 kg (301 lb 6.4 oz)    Exam:   General: Alert, awake, oriented to self, still with expressive aphasia at times  Cardiovascular: S1S2/RRR  Respiratory: CTAB  Abdomen: soft, NT, BS present  Musculoskeletal: no edema c/c  Neuro: no localising signs  Data Reviewed: Basic Metabolic Panel:  Recent Labs Lab 12/06/13 2115 12/06/13 2354 12/07/13 0533  NA 143 144 142  K 3.3* 4.0 3.8  CL 101 102 102  CO2 24 24 28   GLUCOSE 289* 143* 187*  BUN 15 14 13   CREATININE 0.83 0.73 0.76  CALCIUM 9.1 9.4 8.9    Liver Function Tests:  Recent Labs Lab 12/06/13 2115 12/07/13 0533  AST 28 24  ALT 11 9  ALKPHOS 111 99  BILITOT 0.4 0.4  PROT 7.6 6.8  ALBUMIN 3.3* 3.2*   No results found for this basename: LIPASE, AMYLASE,  in the last 168 hours No results found for this basename: AMMONIA,  in the last 168 hours CBC:  Recent Labs Lab 12/06/13 2115 12/07/13 0533  WBC 8.6 8.2  NEUTROABS 6.4  --   HGB 13.0 12.2  HCT 40.2 38.6  MCV 82.5 83.0  PLT 255 246   Cardiac Enzymes:  Recent Labs Lab 12/06/13 2354 12/07/13 0533  TROPONINI <0.30 <0.30   BNP (last 3 results)  Recent Labs  10/27/13 1316 11/02/13 1443 12/06/13 2115  PROBNP 945.0* 2403.0* 1540.0*   CBG:  Recent Labs Lab 12/07/13 0230 12/07/13 0608 12/07/13 1134 12/07/13 2144 12/08/13 0620  GLUCAP 198* 156* 141* 282* 191*    No results found for this or any previous visit (from the past 240 hour(s)).   Studies: Ct Head Wo Contrast  12/07/2013   CLINICAL DATA Altered mental status  EXAM CT HEAD WITHOUT CONTRAST  TECHNIQUE Contiguous axial images were obtained from the base of the skull through the vertex without intravenous contrast.  COMPARISON 12/21/2010  FINDINGS Despite 2 attempts, images through the inferior temporal lobe/posterior fossa degraded by motion. Within this limitation, no definite  CT evidence of an acute infarction. Mild periventricular and subcortical white matter hypodensities are similar to prior, a nonspecific finding often seen in the setting of chronic microangiopathic change. No intraparenchymal hemorrhage, mass, mass effect, or abnormal extra-axial fluid collection. Partially imaged paranasal sinuses suggest sulcal thickening, polyps, or mucous retention cysts. Mastoid air cells are predominantly clear.  IMPRESSION Images through the inferior temporal lobes and posterior fossa are degraded by motion despite 2 attempts. Otherwise, no acute intracranial abnormality identified by CT.  SIGNATURE   Electronically Signed   By: Carlos Levering M.D.   On: 12/07/2013 05:53    Scheduled Meds: . amLODipine  10 mg Oral Daily  . ARIPiprazole  5 mg Oral QHS  . atorvastatin  20 mg Oral q1800  . enoxaparin (LOVENOX) injection  40 mg Subcutaneous Q24H  . fosinopril  40 mg Oral Daily  . furosemide  80 mg Oral BID  . insulin aspart  0-15 Units Subcutaneous TID WC  . insulin aspart  0-5 Units Subcutaneous QHS  . insulin glargine  30 Units Subcutaneous QHS  . metoprolol succinate  50 mg Oral Daily  . sertraline  100 mg Oral Daily  . sodium chloride  3 mL Intravenous Q12H   Continuous Infusions:  Antibiotics Given (last 72 hours)   None      Principal Problem:   SVT (supraventricular tachycardia) Active Problems:   Bipolar disorder   Morbid obesity   OSA (obstructive sleep apnea)- non compliant with C-pap   IDDM (insulin dependent diabetes mellitus)   Acute on chronic diastolic congestive heart failure   Pulmonary HTN, moderate to severe   HTN (hypertension)    Time spent: 33min    Raini Tiley  [facility] Pager [phone]. If 7PM-7AM, please contact night-coverage at [email or web address], password [facility] 12/08/2013, 8:59 AM  LOS: 2 days

## 2013-12-09 LAB — GLUCOSE, CAPILLARY
GLUCOSE-CAPILLARY: 240 mg/dL — AB (ref 70–99)
Glucose-Capillary: 195 mg/dL — ABNORMAL HIGH (ref 70–99)
Glucose-Capillary: 265 mg/dL — ABNORMAL HIGH (ref 70–99)
Glucose-Capillary: 199 mg/dL — ABNORMAL HIGH (ref 70–99)

## 2013-12-09 MED ORDER — HYDRALAZINE HCL 25 MG PO TABS
25.0000 mg | ORAL_TABLET | Freq: Three times a day (TID) | ORAL | Status: DC
  Administered 2013-12-09 – 2013-12-10 (×4): 25 mg via ORAL
  Filled 2013-12-09 (×7): qty 1

## 2013-12-09 MED ORDER — TRAZODONE HCL 100 MG PO TABS
100.0000 mg | ORAL_TABLET | Freq: Every day | ORAL | Status: DC
  Administered 2013-12-09: 100 mg via ORAL
  Filled 2013-12-09 (×2): qty 1

## 2013-12-09 MED ORDER — ARIPIPRAZOLE 10 MG PO TABS
10.0000 mg | ORAL_TABLET | Freq: Every day | ORAL | Status: DC
  Administered 2013-12-09: 10 mg via ORAL
  Filled 2013-12-09 (×2): qty 1

## 2013-12-09 NOTE — Progress Notes (Addendum)
[facility] PROGRESS NOTE  [name] FWY:[id number] DOB: 04/02/1966 DOA: 12/06/2013 PCP: No PCP Per Patient  Assessment/Plan: 1. AMS/Speech disturbance/MAnia/?psychosis - would not cooperate to have MRI despite multiple attempts 3/12 and 3/13 - Do not suspect CVA but unable to concretely rule this out - her speech is fluent at times and with difficulty at others - Now I suspect this is related to Mania like state from her Bipolar d/o - Dr.Jonalaggada following, will re-eval today-THANK YOU  2. DM -lantus, SSI  3. HTN -uncontrolled -resume home fosinopril, add hydralazine, Toprol, amlodipine  4. Bipolar disorder -on Abilify, Zoloft, trazodone QHS PRN -Psych efollowing  5. Chronic diastolic CHF -stable, continue PO lasix  6. Transient SVT in ER -resolved -ECHo normal, tele NSR -continue toprol  DVT proph: Lovenox  Code Status: Full Code Family Communication: d/w mother at bedside Disposition Plan: Home pending psych eval   HPI/Subjective: Way more crazy and manic today  Objective: Filed Vitals:   12/09/13 0959  BP: 187/107  Pulse: 84  Temp:   Resp:     Intake/Output Summary (Last 24 hours) at 12/09/13 1023 Last data filed at 12/08/13 1300  Gross per 24 hour  Intake      0 ml  Output      0 ml  Net      0 ml   Filed Weights   12/07/13 0147 12/08/13 0534 12/09/13 0540  Weight: 139.39 kg (307 lb 4.8 oz) 136.714 kg (301 lb 6.4 oz) 136.85 kg (301 lb 11.2 oz)    Exam:   General: Alert, awake, manic, hyperactive, agitated, restless  Cardiovascular: S1S2/RRR  Respiratory: CTAB  Abdomen: soft, NT, BS present  Musculoskeletal: no edema c/c  Neuro: no localising signs  Data Reviewed: Basic Metabolic Panel:  Recent Labs Lab 12/06/13 2115 12/06/13 2354 12/07/13 0533  NA 143 144 142  K 3.3* 4.0 3.8  CL 101 102 102  CO2 24 24 28   GLUCOSE 289* 143* 187*  BUN 15 14 13   CREATININE 0.83 0.73 0.76  CALCIUM 9.1 9.4 8.9   Liver Function  Tests:  Recent Labs Lab 12/06/13 2115 12/07/13 0533  AST 28 24  ALT 11 9  ALKPHOS 111 99  BILITOT 0.4 0.4  PROT 7.6 6.8  ALBUMIN 3.3* 3.2*   No results found for this basename: LIPASE, AMYLASE,  in the last 168 hours No results found for this basename: AMMONIA,  in the last 168 hours CBC:  Recent Labs Lab 12/06/13 2115 12/07/13 0533  WBC 8.6 8.2  NEUTROABS 6.4  --   HGB 13.0 12.2  HCT 40.2 38.6  MCV 82.5 83.0  PLT 255 246   Cardiac Enzymes:  Recent Labs Lab 12/06/13 2354 12/07/13 0533  TROPONINI <0.30 <0.30   BNP (last 3 results)  Recent Labs  10/27/13 1316 11/02/13 1443 12/06/13 2115  PROBNP 945.0* 2403.0* 1540.0*   CBG:  Recent Labs Lab 12/08/13 0620 12/08/13 1129 12/08/13 1644 12/08/13 2110 12/09/13 0624  GLUCAP 191* 247* 170* 194* 195*    No results found for this or any previous visit (from the past 240 hour(s)).   Studies: No results found.  Scheduled Meds: . amLODipine  10 mg Oral Daily  . ARIPiprazole  5 mg Oral QHS  . aspirin  81 mg Oral Daily  . atorvastatin  20 mg Oral q1800  . enoxaparin (LOVENOX) injection  40 mg Subcutaneous Q24H  . fosinopril  40 mg Oral Daily  . furosemide  80 mg Oral BID  .  hydrALAZINE  25 mg Oral 3 times per day  . insulin aspart  0-15 Units Subcutaneous TID WC  . insulin aspart  0-5 Units Subcutaneous QHS  . insulin glargine  30 Units Subcutaneous QHS  . metoprolol succinate  50 mg Oral Daily  . sertraline  100 mg Oral Daily  . sodium chloride  3 mL Intravenous Q12H   Continuous Infusions:  Antibiotics Given (last 72 hours)   None      Principal Problem:   SVT (supraventricular tachycardia) Active Problems:   Bipolar disorder   Morbid obesity   OSA (obstructive sleep apnea)- non compliant with C-pap   IDDM (insulin dependent diabetes mellitus)   Acute on chronic diastolic congestive heart failure   Pulmonary HTN, moderate to severe   HTN (hypertension)   Speech abnormality    Time  spent: 67min    [name]  [facility] Pager [phone]. If 7PM-7AM, please contact night-coverage at [email or web address], password [facility] 12/09/2013, 10:23 AM  LOS: 3 days

## 2013-12-09 NOTE — Progress Notes (Signed)
Dr. Broadus John asked to have telemetry d/c's this am.  Placed order at shift change to reflect this. Payton Emerald

## 2013-12-09 NOTE — Consult Note (Signed)
[facility] Face-to-Face Psychiatry Consult   Reason for Consult:  Bipolar disorder and medication management Referring Physician:  Dr. Fabiola Backer [name] is an 48 y.o. female. Total Time spent with patient: 45 minutes  Assessment: AXIS I:  Bipolar, Depressed AXIS II:  Deferred AXIS III:   Past Medical History  Diagnosis Date  . Insulin dependent type 2 diabetes mellitus, uncontrolled   . Chronic diastolic CHF (congestive heart failure)   . Morbid obesity with BMI of 50.0-59.9, adult   . Bipolar disease, chronic   . History of thyrotoxicosis   . OSA (obstructive sleep apnea) 03/06/2011  . Chest pain     a. 2012 Myoview: EF 63%, no isch/infarct  . Chronic diastolic congestive heart failure 07/23/2011  . Hypertensive heart disease 10/18/2013   AXIS IV:  economic problems, occupational problems, other psychosocial or environmental problems, problems related to social environment and problems with primary support group AXIS V:  41-50 serious symptoms  Plan:  No evidence of imminent risk to self or others at present.   Patient does not meet criteria for psychiatric inpatient admission. Supportive therapy provided about ongoing stressors. Continue Zoloft 100 mg PO QD Increase Ttrazodone 100 mg PO Qhs Increase Abilify 10 mg PO Qhs   May contact [phone] if needed further assistance  Subjective:   [name] is a 48 y.o. female patient admitted with bipolar disorder.  HPI:  HPI: Patient was seen and chart reviewed. Patient mother was at bedside along with multiple family members during this visit. Patient was emotional and unable to provide linear and goal-directed history. Most of the history was obtained from the available medical records and the interview with patient mother. Patient was known to this provider from her previous hospitalization about a month ago for a similar condition. Patient mother reported she has been more depressed, lethargy and poor appetite.  Patient was seen by her  psychiatric today we'll recommended to stop using clonazepam and reduce the dose of Zoloft from 100 mg to 50 mg and changed trazodone from scheduled to when necessary and recommended to increase Abilify 10 mg daily. Patient denies current symptoms of depression, suicide and homicidal ideation, intention or plan.  Interval history: Psychiatric consultation service is was requested to reevaluate for medication management as patient has been more irritable, agitated and not able to relax since he has difficulty sleeping overnight. Patient and her mother stated that she did not sleep last night and pacing in her room and increased anxiety this morning and refuses or argumentative about taking her medication this morning. Patient and her mother has an agreement with the edges to her medication Abilify to 10 mg and trazodone 200 mg tonight for better control of insomnia and restlessness. Patient has more stuttering when she gets more anxious. Patient may be discharged tomorrow and if she can have a low incidence per night.    Review of Systems: as mentioned in the history of present illness.  A Comprehensive review of the other systems is negative.   Past Psychiatric History: Past Medical History  Diagnosis Date  . Insulin dependent type 2 diabetes mellitus, uncontrolled   . Chronic diastolic CHF (congestive heart failure)   . Morbid obesity with BMI of 50.0-59.9, adult   . Bipolar disease, chronic   . History of thyrotoxicosis   . OSA (obstructive sleep apnea) 03/06/2011  . Chest pain     a. 2012 Myoview: EF 63%, no isch/infarct  . Chronic diastolic congestive heart failure 07/23/2011  . Hypertensive  heart disease 10/18/2013    reports that she has never smoked. She has never used smokeless tobacco. She reports that she does not drink alcohol or use illicit drugs. Family History  Problem Relation Age of Onset  . Heart failure Father   . Stroke Father   . Heart disease Maternal Grandfather   .  Heart disease Paternal Grandfather   . Hypertension Mother      Living Arrangements: Parent;Other relatives   Abuse/Neglect [facility]) Physical Abuse: Denies Verbal Abuse: Denies Sexual Abuse: Denies Allergies:   Allergies  Allergen Reactions  . Acetaminophen     Fits as a child "seizures-like"  . Caffeine     Tense, anxiety, increased urination  . Lisinopril     Rash with lisinopril; but fosinopril is ok per patient    Objective: Blood pressure 187/107, pulse 84, temperature 98.8 F (37.1 C), temperature source Oral, resp. rate 18, height _0  (1.676 m), weight 136.85 kg (301 lb 11.2 oz), SpO2 94.00%.Body mass index is 48.72 kg/(m^2). Results for orders placed during the hospital encounter of 12/06/13 (from the past 72 hour(s))  CBG MONITORING, ED     Status: Abnormal   Collection Time    12/06/13  8:50 PM      Result Value Ref Range   Glucose-Capillary 280 (*) 70 - 99 mg/dL  CBC WITH DIFFERENTIAL     Status: Abnormal   Collection Time    12/06/13  9:15 PM      Result Value Ref Range   WBC 8.6  4.0 - 10.5 K/uL   RBC 4.87  3.87 - 5.11 MIL/uL   Hemoglobin 13.0  12.0 - 15.0 g/dL   HCT 40.2  36.0 - 46.0 %   MCV 82.5  78.0 - 100.0 fL   MCH 26.7  26.0 - 34.0 pg   MCHC 32.3  30.0 - 36.0 g/dL   RDW 16.2 (*) 11.5 - 15.5 %   Platelets 255  150 - 400 K/uL   Neutrophils Relative % 74  43 - 77 %   Neutro Abs 6.4  1.7 - 7.7 K/uL   Lymphocytes Relative 18  12 - 46 %   Lymphs Abs 1.6  0.7 - 4.0 K/uL   Monocytes Relative 7  3 - 12 %   Monocytes Absolute 0.6  0.1 - 1.0 K/uL   Eosinophils Relative 0  0 - 5 %   Eosinophils Absolute 0.0  0.0 - 0.7 K/uL   Basophils Relative 0  0 - 1 %   Basophils Absolute 0.0  0.0 - 0.1 K/uL  COMPREHENSIVE METABOLIC PANEL     Status: Abnormal   Collection Time    12/06/13  9:15 PM      Result Value Ref Range   Sodium 143  137 - 147 mEq/L   Potassium 3.3 (*) 3.7 - 5.3 mEq/L   Chloride 101  96 - 112 mEq/L   CO2 24  19 - 32 mEq/L   Glucose, Bld 289  (*) 70 - 99 mg/dL   BUN 15  6 - 23 mg/dL   Creatinine, Ser 0.83  0.50 - 1.10 mg/dL   Calcium 9.1  8.4 - 10.5 mg/dL   Total Protein 7.6  6.0 - 8.3 g/dL   Albumin 3.3 (*) 3.5 - 5.2 g/dL   AST 28  0 - 37 U/L   ALT 11  0 - 35 U/L   Alkaline Phosphatase 111  39 - 117 U/L   Total Bilirubin 0.4  0.3 -  1.2 mg/dL   GFR calc non Af Amer 84 (*) >90 mL/min   GFR calc Af Amer >90  >90 mL/min   Comment: (NOTE)     The eGFR has been calculated using the CKD EPI equation.     This calculation has not been validated in all clinical situations.     eGFR's persistently <90 mL/min signify possible Chronic Kidney     Disease.  PRO B NATRIURETIC PEPTIDE     Status: Abnormal   Collection Time    12/06/13  9:15 PM      Result Value Ref Range   Pro B Natriuretic peptide (BNP) 1540.0 (*) 0 - 125 pg/mL  I-STAT TROPOININ, ED     Status: None   Collection Time    12/06/13  9:21 PM      Result Value Ref Range   Troponin i, poc 0.02  0.00 - 0.08 ng/mL   Comment 3            Comment: Due to the release kinetics of cTnI,     a negative result within the first hours     of the onset of symptoms does not rule out     myocardial infarction with certainty.     If myocardial infarction is still suspected,     repeat the test at appropriate intervals.  I-STAT ARTERIAL BLOOD GAS, ED     Status: Abnormal   Collection Time    12/06/13 11:18 PM      Result Value Ref Range   pH, Arterial 7.423  7.350 - 7.450   pCO2 arterial 44.3  35.0 - 45.0 mmHg   pO2, Arterial 61.0 (*) 80.0 - 100.0 mmHg   Bicarbonate 28.9 (*) 20.0 - 24.0 mEq/L   TCO2 30  0 - 100 mmol/L   O2 Saturation 91.0     Acid-Base Excess 4.0 (*) 0.0 - 2.0 mmol/L   Patient temperature 98.6 F     Collection site RADIAL, ALLEN'S TEST ACCEPTABLE     Drawn by Operator     Sample type ARTERIAL    TSH     Status: None   Collection Time    12/06/13 11:54 PM      Result Value Ref Range   TSH 3.544  0.350 - 4.500 uIU/mL   Comment: Performed at [facility]  TROPONIN I     Status: None   Collection Time    12/06/13 11:54 PM      Result Value Ref Range   Troponin I <0.30  <0.30 ng/mL   Comment:            Due to the release kinetics of cTnI,     a negative result within the first hours     of the onset of symptoms does not rule out     myocardial infarction with certainty.     If myocardial infarction is still suspected,     repeat the test at appropriate intervals.  LACTIC ACID, PLASMA     Status: None   Collection Time    12/06/13 11:54 PM      Result Value Ref Range   Lactic Acid, Venous 0.9  0.5 - 2.2 mmol/L  BASIC METABOLIC PANEL     Status: Abnormal   Collection Time    12/06/13 11:54 PM      Result Value Ref Range   Sodium 144  137 - 147 mEq/L   Potassium 4.0  3.7 - 5.3 mEq/L  Comment: DELTA CHECK NOTED   Chloride 102  96 - 112 mEq/L   CO2 24  19 - 32 mEq/L   Glucose, Bld 143 (*) 70 - 99 mg/dL   BUN 14  6 - 23 mg/dL   Creatinine, Ser 0.73  0.50 - 1.10 mg/dL   Calcium 9.4  8.4 - 10.5 mg/dL   GFR calc non Af Amer >90  >90 mL/min   GFR calc Af Amer >90  >90 mL/min   Comment: (NOTE)     The eGFR has been calculated using the CKD EPI equation.     This calculation has not been validated in all clinical situations.     eGFR's persistently <90 mL/min signify possible Chronic Kidney     Disease.  URINALYSIS, ROUTINE W REFLEX MICROSCOPIC     Status: Abnormal   Collection Time    12/07/13 12:41 AM      Result Value Ref Range   Color, Urine AMBER (*) YELLOW   Comment: BIOCHEMICALS MAY BE AFFECTED BY COLOR   APPearance TURBID (*) CLEAR   Specific Gravity, Urine 1.029  1.005 - 1.030   pH 5.5  5.0 - 8.0   Glucose, UA NEGATIVE  NEGATIVE mg/dL   Hgb urine dipstick NEGATIVE  NEGATIVE   Bilirubin Urine SMALL (*) NEGATIVE   Ketones, ur 15 (*) NEGATIVE mg/dL   Protein, ur 100 (*) NEGATIVE mg/dL   Urobilinogen, UA 0.2  0.0 - 1.0 mg/dL   Nitrite NEGATIVE  NEGATIVE   Leukocytes, UA SMALL (*) NEGATIVE  URINE  MICROSCOPIC-ADD ON     Status: Abnormal   Collection Time    12/07/13 12:41 AM      Result Value Ref Range   Squamous Epithelial / LPF FEW (*) RARE   WBC, UA 3-6  <3 WBC/hpf   RBC / HPF 0-2  <3 RBC/hpf   Bacteria, UA RARE  RARE   Casts HYALINE CASTS (*) NEGATIVE   Urine-Other AMORPHOUS URATES/PHOSPHATES    CBG MONITORING, ED     Status: Abnormal   Collection Time    12/07/13 12:53 AM      Result Value Ref Range   Glucose-Capillary 127 (*) 70 - 99 mg/dL  GLUCOSE, CAPILLARY     Status: Abnormal   Collection Time    12/07/13  2:30 AM      Result Value Ref Range   Glucose-Capillary 198 (*) 70 - 99 mg/dL  COMPREHENSIVE METABOLIC PANEL     Status: Abnormal   Collection Time    12/07/13  5:33 AM      Result Value Ref Range   Sodium 142  137 - 147 mEq/L   Potassium 3.8  3.7 - 5.3 mEq/L   Chloride 102  96 - 112 mEq/L   CO2 28  19 - 32 mEq/L   Glucose, Bld 187 (*) 70 - 99 mg/dL   BUN 13  6 - 23 mg/dL   Creatinine, Ser 0.76  0.50 - 1.10 mg/dL   Calcium 8.9  8.4 - 10.5 mg/dL   Total Protein 6.8  6.0 - 8.3 g/dL   Albumin 3.2 (*) 3.5 - 5.2 g/dL   AST 24  0 - 37 U/L   ALT 9  0 - 35 U/L   Alkaline Phosphatase 99  39 - 117 U/L   Total Bilirubin 0.4  0.3 - 1.2 mg/dL   GFR calc non Af Amer >90  >90 mL/min   GFR calc Af Amer >90  >90 mL/min  Comment: (NOTE)     The eGFR has been calculated using the CKD EPI equation.     This calculation has not been validated in all clinical situations.     eGFR's persistently <90 mL/min signify possible Chronic Kidney     Disease.  CBC     Status: Abnormal   Collection Time    12/07/13  5:33 AM      Result Value Ref Range   WBC 8.2  4.0 - 10.5 K/uL   RBC 4.65  3.87 - 5.11 MIL/uL   Hemoglobin 12.2  12.0 - 15.0 g/dL   HCT 38.6  36.0 - 46.0 %   MCV 83.0  78.0 - 100.0 fL   MCH 26.2  26.0 - 34.0 pg   MCHC 31.6  30.0 - 36.0 g/dL   RDW 16.4 (*) 11.5 - 15.5 %   Platelets 246  150 - 400 K/uL  PROTIME-INR     Status: None   Collection Time     12/07/13  5:33 AM      Result Value Ref Range   Prothrombin Time 14.9  11.6 - 15.2 seconds   INR 1.20  0.00 - 1.49  TROPONIN I     Status: None   Collection Time    12/07/13  5:33 AM      Result Value Ref Range   Troponin I <0.30  <0.30 ng/mL   Comment:            Due to the release kinetics of cTnI,     a negative result within the first hours     of the onset of symptoms does not rule out     myocardial infarction with certainty.     If myocardial infarction is still suspected,     repeat the test at appropriate intervals.  GLUCOSE, CAPILLARY     Status: Abnormal   Collection Time    12/07/13  6:08 AM      Result Value Ref Range   Glucose-Capillary 156 (*) 70 - 99 mg/dL  GLUCOSE, CAPILLARY     Status: Abnormal   Collection Time    12/07/13 11:34 AM      Result Value Ref Range   Glucose-Capillary 141 (*) 70 - 99 mg/dL   Comment 1 Notify RN    GLUCOSE, CAPILLARY     Status: Abnormal   Collection Time    12/07/13  9:44 PM      Result Value Ref Range   Glucose-Capillary 282 (*) 70 - 99 mg/dL   Comment 1 Documented in Chart     Comment 2 Notify RN    GLUCOSE, CAPILLARY     Status: Abnormal   Collection Time    12/08/13  6:20 AM      Result Value Ref Range   Glucose-Capillary 191 (*) 70 - 99 mg/dL   Comment 1 Documented in Chart     Comment 2 Notify RN    GLUCOSE, CAPILLARY     Status: Abnormal   Collection Time    12/08/13 11:29 AM      Result Value Ref Range   Glucose-Capillary 247 (*) 70 - 99 mg/dL   Comment 1 Notify RN    GLUCOSE, CAPILLARY     Status: Abnormal   Collection Time    12/08/13  4:44 PM      Result Value Ref Range   Glucose-Capillary 170 (*) 70 - 99 mg/dL   Comment 1 Notify RN     Comment  2 Documented in Chart    GLUCOSE, CAPILLARY     Status: Abnormal   Collection Time    12/08/13  9:10 PM      Result Value Ref Range   Glucose-Capillary 194 (*) 70 - 99 mg/dL   Comment 1 Documented in Chart     Comment 2 Notify RN    GLUCOSE, CAPILLARY      Status: Abnormal   Collection Time    12/09/13  6:24 AM      Result Value Ref Range   Glucose-Capillary 195 (*) 70 - 99 mg/dL   Comment 1 Documented in Chart     Comment 2 Notify RN    GLUCOSE, CAPILLARY     Status: Abnormal   Collection Time    12/09/13 12:42 PM      Result Value Ref Range   Glucose-Capillary 265 (*) 70 - 99 mg/dL   Comment 1 Notify RN     Labs are reviewed and are pertinent for hyperglycemia a.  Current Facility-Administered Medications  Medication Dose Route Frequency Provider Last Rate Last Dose  . albuterol (PROVENTIL) (2.5 MG/3ML) 0.083% nebulizer solution 2.5 mg  2.5 mg Nebulization Q6H PRN [name], MD      . amLODipine (NORVASC) tablet 10 mg  10 mg Oral Daily Domenic Polite, MD   10 mg at 12/09/13 0958  . ARIPiprazole (ABILIFY) tablet 5 mg  5 mg Oral QHS [name], MD   5 mg at 12/08/13 2240  . aspirin chewable tablet 81 mg  81 mg Oral Daily Domenic Polite, MD   81 mg at 12/09/13 3710  . atorvastatin (LIPITOR) tablet 20 mg  20 mg Oral q1800 [name], MD   20 mg at 12/08/13 1706  . enoxaparin (LOVENOX) injection 40 mg  40 mg Subcutaneous Q24H [name], MD   40 mg at 12/08/13 1058  . Fosinopril 65m   40 mg Oral Daily PDomenic Polite MD   40 mg at 12/09/13 0958  . furosemide (LASIX) tablet 80 mg  80 mg Oral BID PBerle Mull MD   80 mg at 12/09/13 0903  . hydrALAZINE (APRESOLINE) injection 10 mg  10 mg Intravenous Q6H PRN PDomenic Polite MD   10 mg at 12/07/13 1425  . hydrALAZINE (APRESOLINE) tablet 25 mg  25 mg Oral 3 times per day PDomenic Polite MD   25 mg at 12/09/13 0903  . insulin aspart (novoLOG) injection 0-15 Units  0-15 Units Subcutaneous TID WC PBerle Mull MD   3 Units at 12/09/13 0727  . insulin aspart (novoLOG) injection 0-5 Units  0-5 Units Subcutaneous QHS PBerle Mull MD   3 Units at 12/07/13 2156  . insulin glargine (LANTUS) injection 30 Units  30 Units Subcutaneous QHS PDomenic Polite MD   30 Units at 12/08/13 2239  .  metoprolol succinate (TOPROL-XL) 24 hr tablet 50 mg  50 mg Oral Daily PBerle Mull MD   50 mg at 12/09/13 0958  . ondansetron (ZOFRAN) tablet 4 mg  4 mg Oral Q6H PRN PBerle Mull MD       Or  . ondansetron (ZOFRAN) injection 4 mg  4 mg Intravenous Q6H PRN PBerle Mull MD   4 mg at 12/07/13 0057  . sertraline (ZOLOFT) tablet 100 mg  100 mg Oral Daily PBerle Mull MD   100 mg at 12/09/13 0958  . sodium chloride 0.9 % injection 3 mL  3 mL Intravenous Q12H PBerle Mull MD   3 mL at 12/08/13 1707  .  traZODone (DESYREL) tablet 50 mg  50 mg Oral QHS PRN Domenic Polite, MD   50 mg at 12/08/13 2345    Psychiatric Specialty Exam: Physical Exam Full physical performed in Emergency Department. I have reviewed this assessment and concur with its findings.   Review of Systems  Neurological: Positive for tremors and speech change.  Psychiatric/Behavioral: Positive for depression and memory loss. The patient is nervous/anxious and has insomnia.   All other systems reviewed and are negative.    Blood pressure 187/107, pulse 84, temperature 98.8 F (37.1 C), temperature source Oral, resp. rate 18, height _0  (1.676 m), weight 136.85 kg (301 lb 11.2 oz), SpO2 94.00%.Body mass index is 48.72 kg/(m^2).  General Appearance: Guarded  Eye Contact::  Minimal  Speech:  Slurred  Volume:  Decreased  Mood:  Anxious and Depressed  Affect:  Depressed and Flat  Thought Process:  Goal Directed and Intact  Orientation:  Full (Time, Place, and Person)  Thought Content:  Rumination  Suicidal Thoughts:  No  Homicidal Thoughts:  No  Memory:  Immediate;   Fair  Judgement:  Fair  Insight:  Fair  Psychomotor Activity:  Psychomotor Retardation  Concentration:  Fair  Recall:  [employer] of Knowledge:Fair  Language: Fair  Akathisia:  NA  Handed:  Right  AIMS (if indicated):     Assets:  Communication Skills Desire for Improvement Leisure Time Physical Health Resilience Social Support  Sleep:       Musculoskeletal: Strength & Muscle Tone: within normal limits Gait & Station: normal Patient leans: N/A  Treatment Plan Summary: Daily contact with patient to assess and evaluate symptoms and progress in treatment Medication management  Zaydn Gutridge,JANARDHAHA R. 12/09/2013 1:40 PM

## 2013-12-09 NOTE — Progress Notes (Signed)
Utilization review completed.  P.J. Alesa Echevarria,RN,BSN Case Manager [phone]  

## 2013-12-10 DIAGNOSIS — F309 Manic episode, unspecified: Secondary | ICD-10-CM | POA: Diagnosis present

## 2013-12-10 DIAGNOSIS — I10 Essential (primary) hypertension: Secondary | ICD-10-CM

## 2013-12-10 LAB — GLUCOSE, CAPILLARY: Glucose-Capillary: 221 mg/dL — ABNORMAL HIGH (ref 70–99)

## 2013-12-10 MED ORDER — AMLODIPINE BESYLATE 10 MG PO TABS: 10.0000 mg | ORAL_TABLET | Freq: Every day | ORAL | Status: DC

## 2013-12-10 MED ORDER — INSULIN ASPART 100 UNIT/ML ~~LOC~~ SOLN: 2.0000 [IU] | Freq: Three times a day (TID) | SUBCUTANEOUS | Status: DC

## 2013-12-10 MED ORDER — ARIPIPRAZOLE 5 MG PO TABS: 10.0000 mg | ORAL_TABLET | Freq: Every day | ORAL | Status: DC

## 2013-12-10 MED ORDER — HYDRALAZINE HCL 25 MG PO TABS: 25.0000 mg | ORAL_TABLET | Freq: Two times a day (BID) | ORAL | Status: DC

## 2013-12-10 MED ORDER — INSULIN GLARGINE 100 UNIT/ML ~~LOC~~ SOLN: 30.0000 [IU] | Freq: Every day | SUBCUTANEOUS | Status: DC

## 2013-12-10 MED ORDER — TRAZODONE HCL 100 MG PO TABS: 100.0000 mg | ORAL_TABLET | Freq: Every day | ORAL | Status: DC

## 2013-12-10 NOTE — Progress Notes (Signed)
Utilization Review Completed.   Isidor Bromell, RN, BSN Nurse Case Manager  

## 2013-12-12 ENCOUNTER — Other Ambulatory Visit: Payer: Self-pay

## 2013-12-12 DIAGNOSIS — E119 Type 2 diabetes mellitus without complications: Secondary | ICD-10-CM

## 2013-12-12 MED ORDER — INSULIN GLARGINE 100 UNIT/ML ~~LOC~~ SOLN: 30.0000 [IU] | Freq: Every day | SUBCUTANEOUS | Status: DC

## 2013-12-19 ENCOUNTER — Ambulatory Visit: Payer: No Typology Code available for payment source | Attending: Internal Medicine | Admitting: Internal Medicine

## 2013-12-19 VITALS — BP 150/94 | HR 75 | Temp 98.0°F | Resp 16 | Ht 66.0 in | Wt 298.0 lb

## 2013-12-19 DIAGNOSIS — I10 Essential (primary) hypertension: Secondary | ICD-10-CM

## 2013-12-19 DIAGNOSIS — F29 Unspecified psychosis not due to a substance or known physiological condition: Secondary | ICD-10-CM | POA: Insufficient documentation

## 2013-12-19 DIAGNOSIS — Z79899 Other long term (current) drug therapy: Secondary | ICD-10-CM | POA: Insufficient documentation

## 2013-12-19 DIAGNOSIS — E119 Type 2 diabetes mellitus without complications: Secondary | ICD-10-CM | POA: Insufficient documentation

## 2013-12-19 DIAGNOSIS — E1310 Other specified diabetes mellitus with ketoacidosis without coma: Secondary | ICD-10-CM

## 2013-12-19 DIAGNOSIS — F319 Bipolar disorder, unspecified: Secondary | ICD-10-CM | POA: Insufficient documentation

## 2013-12-19 DIAGNOSIS — E1110 Type 2 diabetes mellitus with ketoacidosis without coma: Secondary | ICD-10-CM

## 2013-12-19 DIAGNOSIS — G4733 Obstructive sleep apnea (adult) (pediatric): Secondary | ICD-10-CM | POA: Insufficient documentation

## 2013-12-19 DIAGNOSIS — I119 Hypertensive heart disease without heart failure: Secondary | ICD-10-CM | POA: Insufficient documentation

## 2013-12-19 DIAGNOSIS — I509 Heart failure, unspecified: Secondary | ICD-10-CM | POA: Insufficient documentation

## 2013-12-19 LAB — GLUCOSE, POCT (MANUAL RESULT ENTRY): POC Glucose: 186 mg/dl — AB (ref 70–99)

## 2013-12-19 MED ORDER — INSULIN ASPART 100 UNIT/ML ~~LOC~~ SOLN: 2.0000 [IU] | Freq: Three times a day (TID) | SUBCUTANEOUS | Status: DC

## 2013-12-19 MED ORDER — INSULIN GLARGINE 100 UNIT/ML ~~LOC~~ SOLN: 30.0000 [IU] | Freq: Every day | SUBCUTANEOUS | Status: DC

## 2013-12-19 MED ORDER — FOSINOPRIL SODIUM 40 MG PO TABS: 40.0000 mg | ORAL_TABLET | Freq: Every day | ORAL | Status: DC

## 2013-12-19 NOTE — Progress Notes (Signed)
Pt is here following up on her diabetes and CHF.

## 2013-12-19 NOTE — Progress Notes (Signed)
Patient ID: [name], female   DOB: 1966/03/11, 48 y.o.   MRN: [id number]   CC:  HPI: Patient seen in followup for recently hospitalized for altered mental status, mania and psychosis. The patient was evaluated by Dr.Janardhaha Mickeal Skinner, MD, psychiatry and was recommended to continue on. Zoloft 100 mg PO QD  Increase Ttrazodone 100 mg PO Qhs  Increase Abilify 10 mg PO Qhs  The patient is rocking back and forth, appears to be anxious and noncommunicative. She has an upcoming appointment with psychiatry next week  Patient is accompanied today by her mother. She claims that her diabetes has been under good control. Her Lantus was increased from 25-30 units and patient was placed on short-acting insulin sliding scale.  She was also found to have uncontrolled hypertension and was recently started on Norvasc and hydralazine. Mother claims that ever since the patient was started on hydralazine she has had worsening anxiety, therefore the patient has not been getting it for the last 3 days.  Also had transient SVT  in the ER which has now resolved.     Allergies  Allergen Reactions  . Acetaminophen     Fits as a child "seizures-like"  . Caffeine     Tense, anxiety, increased urination  . Lisinopril     Rash with lisinopril; but fosinopril is ok per patient   Past Medical History  Diagnosis Date  . Insulin dependent type 2 diabetes mellitus, uncontrolled   . Chronic diastolic CHF (congestive heart failure)   . Morbid obesity with BMI of 50.0-59.9, adult   . Bipolar disease, chronic   . History of thyrotoxicosis   . OSA (obstructive sleep apnea) 03/06/2011  . Chest pain     a. 2012 Myoview: EF 63%, no isch/infarct  . Chronic diastolic congestive heart failure 07/23/2011  . Hypertensive heart disease 10/18/2013   Current Outpatient Prescriptions on File Prior to Visit  Medication Sig Dispense Refill  . amLODipine (NORVASC) 10 MG tablet Take 1 tablet (10 mg total) by mouth  daily.  30 tablet  0  . ARIPiprazole (ABILIFY) 5 MG tablet Take 2 tablets (10 mg total) by mouth at bedtime.    0  . furosemide (LASIX) 40 MG tablet Take 2 tablets (80 mg total) by mouth 2 (two) times daily.  120 tablet  0  . metFORMIN (GLUCOPHAGE-XR) 500 MG 24 hr tablet Take 1 tablet (500 mg total) by mouth 2 (two) times daily.  180 tablet  3  . metoprolol succinate (TOPROL-XL) 50 MG 24 hr tablet Take 1 tablet (50 mg total) by mouth daily. Take with or immediately following a meal.  90 tablet  3  . potassium chloride (K-DUR,KLOR-CON) 10 MEQ tablet Take 10 mEq by mouth 2 (two) times daily.      . rosuvastatin (CRESTOR) 10 MG tablet Take 1 tablet (10 mg total) by mouth daily.  90 tablet  3  . sertraline (ZOLOFT) 100 MG tablet Take 1 tablet (100 mg total) by mouth daily.  90 tablet  3  . traZODone (DESYREL) 100 MG tablet Take 1 tablet (100 mg total) by mouth at bedtime.      Marland Kitchen albuterol (PROVENTIL HFA;VENTOLIN HFA) 108 (90 BASE) MCG/ACT inhaler Inhale 1-2 puffs into the lungs every 6 (six) hours as needed for wheezing or shortness of breath.  1 Inhaler  3   No current facility-administered medications on file prior to visit.   Family History  Problem Relation Age of Onset  . Heart failure Father   .  Stroke Father   . Heart disease Maternal Grandfather   . Heart disease Paternal Grandfather   . Hypertension Mother    History   Social History  . Marital Status: Single    Spouse Name: N/A    Number of Children: 0  . Years of Education: 3   Occupational History  . unemployed    Social History Main Topics  . Smoking status: Never Smoker   . Smokeless tobacco: Never Used  . Alcohol Use: No  . Drug Use: No  . Sexual Activity: Not Currently    Birth Control/ Protection: None   Other Topics Concern  . 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 But ran out of money and  is no longer attending school. (Note patient has bipolar disorder).      Born in [place] but lived in [place] before coming back to [place].       Primary language is Arabic. Lives with mother and brother.                Review of Systems  Constitutional: Negative for fever, chills, diaphoresis, activity change, appetite change and fatigue.  HENT: Negative for ear pain, nosebleeds, congestion, facial swelling, rhinorrhea, neck pain, neck stiffness and ear discharge.   Eyes: Negative for pain, discharge, redness, itching and visual disturbance.  Respiratory: Negative for cough, choking, chest tightness, shortness of breath, wheezing and stridor.   Cardiovascular: Negative for chest pain, palpitations and leg swelling.  Gastrointestinal: Negative for abdominal distention.  Genitourinary: Negative for dysuria, urgency, frequency, hematuria, flank pain, decreased urine volume, difficulty urinating and dyspareunia.  Musculoskeletal: Negative for back pain, joint swelling, arthralgias and gait problem.  Neurological: Negative for dizziness, tremors, seizures, syncope, facial asymmetry, speech difficulty, weakness, light-headedness, numbness and headaches.  Hematological: Negative for adenopathy. Does not bruise/bleed easily.  Psychiatric/Behavioral: Negative for hallucinations, behavioral problems, confusion, dysphoric mood, decreased concentration and agitation.    Objective:   Filed Vitals:   12/19/13 1656  BP: 150/94  Pulse: 75  Temp: 98 F (36.7 C)  Resp: 16    Physical Exam  Constitutional: Appears well-developed and well-nourished. No distress.  HENT: Normocephalic. External right and left ear normal. Oropharynx is clear and moist.  Eyes: Conjunctivae and EOM are normal. PERRLA, no scleral icterus.  Neck: Normal ROM. Neck supple. No JVD. No tracheal deviation. No thyromegaly.  CVS: RRR, S1/S2 +, no murmurs, no gallops, no carotid bruit.  Pulmonary: Effort and breath sounds normal, no  stridor, rhonchi, wheezes, rales.  Abdominal: Soft. BS +,  no distension, tenderness, rebound or guarding.  Musculoskeletal: Normal range of motion. No edema and no tenderness.  Lymphadenopathy: No lymphadenopathy noted, cervical, inguinal. Neuro: Alert. Normal reflexes, muscle tone coordination. No cranial nerve deficit. Skin: Skin is warm and dry. No rash noted. Not diaphoretic. No erythema. No pallor.  Psychiatric: Normal mood and affect. Behavior, judgment, thought content normal.   Lab Results  Component Value Date   WBC 8.2 12/07/2013   HGB 12.2 12/07/2013   HCT 38.6 12/07/2013   MCV 83.0 12/07/2013   PLT 246 12/07/2013   Lab Results  Component Value Date   CREATININE 0.76 12/07/2013   BUN 13 12/07/2013   NA 142 12/07/2013   K 3.8 12/07/2013   CL 102 12/07/2013   CO2 28 12/07/2013    Lab Results  Component Value Date   HGBA1C 7.7* 11/03/2013   Lipid Panel     Component Value Date/Time   CHOL 66 11/03/2013 0127   TRIG 76 11/03/2013 0127   HDL 25* 11/03/2013 0127   CHOLHDL 2.6 11/03/2013 0127   VLDL 15 11/03/2013 0127   LDLCALC 26 11/03/2013 0127       Assessment and plan:   Patient Active Problem List   Diagnosis Date Noted  . Mania 12/10/2013  . Speech abnormality 12/08/2013  . SVT (supraventricular tachycardia) 12/06/2013  . Essential hypertension, benign 11/28/2013  . HTN (hypertension) 11/28/2013  . Diabetes 11/28/2013  . Pulmonary HTN, moderate to severe 11/03/2013  . Acute on chronic diastolic congestive heart failure 11/02/2013  . Hypertensive heart disease 10/18/2013  . IDDM (insulin dependent diabetes mellitus) 10/18/2013  . Chronic diastolic congestive heart failure 07/23/2011  . OSA (obstructive sleep apnea)- non compliant with C-pap 03/06/2011  . Morbid obesity 02/19/2011  . Bipolar disorder        Diabetes According to the mother, well controlled I have refilled Lantus and NovoLog   Bipolar disorder Patient should be closely monitored by her  psychiatrist This was conveyed to the mother At this point mother reiterates that the patient is not suicidal or homicidal   Hypertension Patient anxious there for somewhat elevated Okay to start hydralazine because of concerns about side effects Patient to follow up in 3 months   The patient was given clear instructions to go to ER or return to medical center if symptoms don't improve, worsen or new problems develop. The patient verbalized understanding. The patient was told to call to get any lab results if not heard anything in the next week.

## 2013-12-23 NOTE — Discharge Summary (Signed)
Physician Discharge Summary  [name] IPJ:[id number] DOB: 04-24-1966 DOA: 12/06/2013  PCP: No PCP Per Patient  Admit date: 12/06/2013 Discharge date: 12/23/2013  Time spent: 45 minutes  Recommendations for Outpatient Follow-up:  1. Psychiatry in 1 week  Discharge Diagnoses:    Psychosis   Transient SVT   Bipolar disorder   Morbid obesity   OSA (obstructive sleep apnea)- non compliant with C-pap   IDDM (insulin dependent diabetes mellitus)   Acute on chronic diastolic congestive heart failure   Pulmonary HTN, moderate to severe   HTN (hypertension)   Speech abnormality   Mania   Discharge Condition: improved  Diet recommendation: diabetic  Filed Weights   12/08/13 0534 12/09/13 0540 12/10/13 0403  Weight: 136.714 kg (301 lb 6.4 oz) 136.85 kg (301 lb 11.2 oz) 134.718 kg (297 lb)    History of present illness:  [name] is a 48 y.o. female with Past medical history of diabetes mellitus, chronic diastolic dysfunction, morbid obesity, obstructive sleep apnea noncompliant with CPAP, bipolar disorder, hypertension.  The patient is coming from home.  The history was obtained from both patient and her mother.  As per the mother the patient was at her baseline until 3 days of sister last 3 days there have been more lethargy and poor appetite. Since last one week she appears to have mild worsening in her mental status with more confusion.  With that she was seen by her psychiatric today we'll recommended to stop using clonazepam and reduce the dose of Zoloft from 100 mg to 50 mg and changed trazodone from scheduled to when necessary and recommended to increase Abilify 10 mg daily.  After the psychiatric visit around 5 PM the patient was at home and the mother found that she was diaphoretic and cold, she checked her blood glucose and it was aborted to 20 and she gave the patient 10 units of NovoLog, after that patient continues to remain clammy and she checked her blood pressure  and it was 100/60 with increased heart rate. She also check her blood sugar and it was of oh 150 but due to persistent diaphoresis she called EMS.  EMS found the patient was in a regular narrow complex tachycardia with a heart rate of around 140, they gave her 20 mg IV Cardizem with which the patient converted back to normal sinus rhythm.  Patient was brought to the hospital for further workup.  Pt denies any fever, chills, headache, cough, chest pain, palpitation, nausea, vomiting, abdominal pain, diarrhea, constipation, active bleeding, burning urination, dizziness, pedal edema, focal neurological deficit.  Patient had some orthopnea but denies any PND.  Patient has lost 65 pounds from November.  Patient is compliant with her medications.  Hospital Course:  1. AMS/Speech disturbance/MAnia/?psychosis - would not cooperate to have MRI despite multiple attempts 3/12 and 3/13  - Do not suspect CVA but unable to concretely rule this out  - her speech is fluent at times and with difficulty at others  - We suspect this is related to Mania like state from her Bipolar d/o  - Was followed by Dr.Jonalaggada from Psychiatry and her dose of Abilify and zoloft were increased, she was doing better from a Psychosis standpoint the next day and hence discharged home to the care of her mother to FU with Psychiatry.  2. DM  -continue lantus, SSI   3. HTN  -uncontrolled initially -then improved on fosinopril, hydralazine, Toprol, amlodipine   4. Bipolar disorder  -on Abilify, Zoloft, trazodone QHS  PRN  -as above  5. Chronic diastolic CHF  -stable, continue PO lasix   6. Transient SVT in ER  -resolved  -ECHo normal, tele NSR  -continue toprol   Consultations:  Psychiatry  Discharge Exam: Filed Vitals:   12/10/13 0403  BP: 188/97  Pulse: 92  Temp: 99.6 F (37.6 C)  Resp: 20    General: AAOx3, no distress Cardiovascular: S1S2/RRR Respiratory: CTAB  Discharge Instructions  Discharge  Orders   Future Appointments Provider Department Dept Phone   12/25/2013 3:40 PM Sharmon Revere [place] Office [phone]   03/01/2014 4:30 PM [name], MD [place] [phone]   Future Orders Complete By Expires   Diet - low sodium heart healthy  As directed    Diet Carb Modified  As directed    Increase activity slowly  As directed        Medication List    STOP taking these medications       clonazePAM 0.5 MG tablet  Commonly known as:  KLONOPIN     insulin aspart 100 UNIT/ML injection  Commonly known as:  novoLOG     insulin glargine 100 UNIT/ML injection  Commonly known as:  LANTUS      TAKE these medications       albuterol 108 (90 BASE) MCG/ACT inhaler  Commonly known as:  PROVENTIL HFA;VENTOLIN HFA  Inhale 1-2 puffs into the lungs every 6 (six) hours as needed for wheezing or shortness of breath.     amLODipine 10 MG tablet  Commonly known as:  NORVASC  Take 1 tablet (10 mg total) by mouth daily.     ARIPiprazole 5 MG tablet  Commonly known as:  ABILIFY  Take 2 tablets (10 mg total) by mouth at bedtime.     furosemide 40 MG tablet  Commonly known as:  LASIX  Take 2 tablets (80 mg total) by mouth 2 (two) times daily.     metFORMIN 500 MG 24 hr tablet  Commonly known as:  GLUCOPHAGE-XR  Take 1 tablet (500 mg total) by mouth 2 (two) times daily.     metoprolol succinate 50 MG 24 hr tablet  Commonly known as:  TOPROL-XL  Take 1 tablet (50 mg total) by mouth daily. Take with or immediately following a meal.     potassium chloride 10 MEQ tablet  Commonly known as:  K-DUR,KLOR-CON  Take 10 mEq by mouth 2 (two) times daily.     rosuvastatin 10 MG tablet  Commonly known as:  CRESTOR  Take 1 tablet (10 mg total) by mouth daily.     sertraline 100 MG tablet  Commonly known as:  ZOLOFT  Take 1 tablet (100 mg total) by mouth daily.     traZODone 100 MG tablet  Commonly known as:  DESYREL  Take  1 tablet (100 mg total) by mouth at bedtime.       Allergies  Allergen Reactions  . Acetaminophen     Fits as a child "seizures-like"  . Caffeine     Tense, anxiety, increased urination  . Lisinopril     Rash with lisinopril; but fosinopril is ok per patient       Follow-up Information   Follow up with PCP. Schedule an appointment as soon as possible for a visit in 1 week.      Follow up with [facility]/Psychiatry In 3 days.       The results of significant diagnostics from this  hospitalization (including imaging, microbiology, ancillary and laboratory) are listed below for reference.    Significant Diagnostic Studies: Ct Head Wo Contrast  12/07/2013   CLINICAL DATA Altered mental status  EXAM CT HEAD WITHOUT CONTRAST  TECHNIQUE Contiguous axial images were obtained from the base of the skull through the vertex without intravenous contrast.  COMPARISON 12/21/2010  FINDINGS Despite 2 attempts, images through the inferior temporal lobe/posterior fossa degraded by motion. Within this limitation, no definite CT evidence of an acute infarction. Mild periventricular and subcortical white matter hypodensities are similar to prior, a nonspecific finding often seen in the setting of chronic microangiopathic change. No intraparenchymal hemorrhage, mass, mass effect, or abnormal extra-axial fluid collection. Partially imaged paranasal sinuses suggest sulcal thickening, polyps, or mucous retention cysts. Mastoid air cells are predominantly clear.  IMPRESSION Images through the inferior temporal lobes and posterior fossa are degraded by motion despite 2 attempts. Otherwise, no acute intracranial abnormality identified by CT.  SIGNATURE  Electronically Signed   By: Carlos Levering M.D.   On: 12/07/2013 05:53    Microbiology: No results found for this or any previous visit (from the past 240 hour(s)).   Labs: Basic Metabolic Panel: No results found for this basename: NA, K, CL, CO2, GLUCOSE, BUN,  CREATININE, CALCIUM, MG, PHOS,  in the last 168 hours Liver Function Tests: No results found for this basename: AST, ALT, ALKPHOS, BILITOT, PROT, ALBUMIN,  in the last 168 hours No results found for this basename: LIPASE, AMYLASE,  in the last 168 hours No results found for this basename: AMMONIA,  in the last 168 hours CBC: No results found for this basename: WBC, NEUTROABS, HGB, HCT, MCV, PLT,  in the last 168 hours Cardiac Enzymes: No results found for this basename: CKTOTAL, CKMB, CKMBINDEX, TROPONINI,  in the last 168 hours BNP: BNP (last 3 results)  Recent Labs  10/27/13 1316 11/02/13 1443 12/06/13 2115  PROBNP 945.0* 2403.0* 1540.0*   CBG: No results found for this basename: GLUCAP,  in the last 168 hours     Signed:  Antonae Zbikowski  [facility] 12/23/2013, 3:21 PM

## 2013-12-25 ENCOUNTER — Ambulatory Visit (INDEPENDENT_AMBULATORY_CARE_PROVIDER_SITE_OTHER): Payer: No Typology Code available for payment source | Admitting: Physician Assistant

## 2013-12-25 ENCOUNTER — Encounter: Payer: Self-pay | Admitting: Physician Assistant

## 2013-12-25 VITALS — BP 136/87 | HR 73 | Ht 66.0 in | Wt 293.8 lb

## 2013-12-25 DIAGNOSIS — I509 Heart failure, unspecified: Secondary | ICD-10-CM

## 2013-12-25 DIAGNOSIS — I119 Hypertensive heart disease without heart failure: Secondary | ICD-10-CM

## 2013-12-25 DIAGNOSIS — I5030 Unspecified diastolic (congestive) heart failure: Secondary | ICD-10-CM

## 2013-12-25 DIAGNOSIS — I498 Other specified cardiac arrhythmias: Secondary | ICD-10-CM

## 2013-12-25 DIAGNOSIS — I5032 Chronic diastolic (congestive) heart failure: Secondary | ICD-10-CM

## 2013-12-25 DIAGNOSIS — F319 Bipolar disorder, unspecified: Secondary | ICD-10-CM

## 2013-12-25 DIAGNOSIS — I471 Supraventricular tachycardia: Secondary | ICD-10-CM

## 2013-12-25 NOTE — Progress Notes (Signed)
[place], [address], [place]  [id number] Phone: [phone] Fax:  [phone]  Date:  12/25/2013   ID:  [name], DOB June 27, 1966, MRN [id number]  PCP:  No PCP Per Patient  Cardiologist:  Dr. [name]    History of Present Illness: [name] is a 48 y.o. female with a hx of chest pain, diastolic CHF, morbid obesity, HTN, IDDM, sleep apnea, bipolar d/o with psychosis.  Dr. [name] recommended cardiac cath for evaluation of chest pain in 06/2013 but she declined.   I saw her in 09/2013. Her blood pressure medications had recently been adjusted. Volume was stable at that time.  She was admitted 2/5-2/10 with acute on chronic diastolic CHF. She was followed by cardiology.  She was diuresed with IV Lasix for significant anasarca. Her weight decreased by 27 pounds and she lost 14.3 L. Psychiatry was consulted during that admission for bipolar disorder and generalized anxiety disorder. Medications were adjusted.  She was readmitted 3/11-3/14 with altered mental status. She was noted to have SVT that converted with IV diltiazem by EMS. Follow up echocardiogram demonstrated normal LV function. Cardiology was not consulted.  Psych meds were again adjusted with the assistance of psychiatry.    She is here with her mother who helps with the hx.  There are no reports of chest pain, shortness of breath, syncope, orthopnea, PND or significant pedal edema.   Studies:  - Lexiscan Myoview (10/2012):  Inferoseptal defect c/w soft tissue attenuation, no ischemia, EF 71% (normal study).    - Echo (07/2013):  Severe LVH, EF 55-60%, Gr 2 DD, severe LAE, PASP 63.    - Echo (12/07/13):  Mild LVH, mild focal basal hypertrophy of the septum, vigorous LVF, EF 65-70%, no RWMA, mild MR, mod LAE, mild RAE, PASP 37.    Recent Labs: 11/03/2013: HDL Cholesterol 25*; LDL (calc) 26  12/06/2013: Pro B Natriuretic peptide (BNP) 1540.0*; TSH 3.544  12/07/2013: ALT 9; Creatinine 0.76; Hemoglobin 12.2;  Potassium 3.8   Wt Readings from Last 3 Encounters:  12/25/13 293 lb 12.8 oz (133.267 kg)  12/19/13 298 lb (135.172 kg)  12/10/13 297 lb (134.718 kg)     Past Medical History  Diagnosis Date  . Insulin dependent type 2 diabetes mellitus, uncontrolled   . Chronic diastolic CHF (congestive heart failure)   . Morbid obesity with BMI of 50.0-59.9, adult   . Bipolar disease, chronic   . History of thyrotoxicosis   . OSA (obstructive sleep apnea) 03/06/2011  . Chest pain     a. 2012 Myoview: EF 63%, no isch/infarct  . Chronic diastolic congestive heart failure 07/23/2011  . Hypertensive heart disease 10/18/2013    Current Outpatient Prescriptions  Medication Sig Dispense Refill  . albuterol (PROVENTIL HFA;VENTOLIN HFA) 108 (90 BASE) MCG/ACT inhaler Inhale 1-2 puffs into the lungs every 6 (six) hours as needed for wheezing or shortness of breath.  1 Inhaler  3  . amLODipine (NORVASC) 10 MG tablet Take 1 tablet (10 mg total) by mouth daily.  30 tablet  0  . ARIPiprazole (ABILIFY) 5 MG tablet Take 2 tablets (10 mg total) by mouth at bedtime.    0  . fosinopril (MONOPRIL) 40 MG tablet Take 1 tablet (40 mg total) by mouth daily.  90 tablet  3  . furosemide (LASIX) 40 MG tablet Take 2 tablets (80 mg total) by mouth 2 (two) times daily.  120 tablet  0  . insulin aspart (NOVOLOG) 100 UNIT/ML injection Inject  2-11 Units into the skin 3 (three) times daily before meals. Sliding scale Moderate  CBG: 120-150, take 2 units,  151-200: take 4 units  201-250: take 6 units 251-300: take 8 units 301-350: take 11 units  10 mL  12  . insulin glargine (LANTUS) 100 UNIT/ML injection Inject 0.3 mLs (30 Units total) into the skin at bedtime.  20 mL  2  . metFORMIN (GLUCOPHAGE-XR) 500 MG 24 hr tablet Take 1 tablet (500 mg total) by mouth 2 (two) times daily.  180 tablet  3  . metoprolol succinate (TOPROL-XL) 50 MG 24 hr tablet Take 1 tablet (50 mg total) by mouth daily. Take with or immediately following a meal.   90 tablet  3  . potassium chloride (K-DUR,KLOR-CON) 10 MEQ tablet Take 10 mEq by mouth 2 (two) times daily.      . rosuvastatin (CRESTOR) 10 MG tablet Take 1 tablet (10 mg total) by mouth daily.  90 tablet  3  . sertraline (ZOLOFT) 100 MG tablet Take 1 tablet (100 mg total) by mouth daily.  90 tablet  3  . traZODone (DESYREL) 100 MG tablet Take 1 tablet (100 mg total) by mouth at bedtime.       No current facility-administered medications for this visit.    Allergies:   Acetaminophen; Caffeine; and Lisinopril   Social History:  The patient  reports that she has never smoked. She has never used smokeless tobacco. She reports that she does not drink alcohol or use illicit drugs.   Family History:  The patient's family history includes Heart disease in her maternal grandfather and paternal grandfather; Heart failure in her father; Hypertension in her mother; Stroke in her father.   ROS:  Please see the history of present illness.   All other systems reviewed and negative.   PHYSICAL EXAM: VS:  BP 136/87  Pulse 73  Ht 5\' 6"  (1.676 m)  Wt 293 lb 12.8 oz (133.267 kg)  BMI 47.44 kg/m2 Well nourished, well developed, in no acute distress HEENT: normal Neck: no JVD Cardiac:  Distant S1, S2; RRR; no murmur Lungs:  Decreased bilaterally, no wheezing, rhonchi or rales Abd: soft, nontender, no hepatomegaly Ext: no LE edema Skin: warm and dry Neuro:  CNs 2-12 intact, no focal abnormalities noted  EKG:  NSR, HR 73, normal axis, LAE, no ST changes     ASSESSMENT AND PLAN:  1. Hypertensive Heart Disease:  BP is stable.  Continue current Rx.   2. Chronic Diastolic CHF:  Volume appears stable.  check f/u BMET today.  Adjust Lasix if necessary.  She is getting ready to travel back to [place] for 1-2 mos.  She is concerned about the heat while taking Lasix.  We reviewed several ways to adjust her Lasix while gone and  to monitor her weights closely with an increase in her Lasix if needed.   3. Diabetes Mellitus:  Follow up with primary care.    4. Bipolar Disorder:  F/u with Psychiatry. 5. SVT:  Likely related to acute illness.  No recurrence.  Continue beta blocker.   6. Disposition:  F/u with Dr. [name] in 6 mos.  Signed, Richardson Dopp, PA-C  12/25/2013 4:16 PM

## 2013-12-25 NOTE — Patient Instructions (Signed)
NO CHANGES WERE MADE WITH MEDICATIONS TODAY  LAB WORK TODAY, BMET  Your physician wants you to follow-up in: [place] DR. CRENSHAW. You will receive a reminder letter in the mail two months in advance. If you don't receive a letter, please call our office to schedule the follow-up appointment.

## 2013-12-26 ENCOUNTER — Ambulatory Visit: Payer: No Typology Code available for payment source | Admitting: Internal Medicine

## 2013-12-26 LAB — BASIC METABOLIC PANEL
BUN: 12 mg/dL (ref 6–23)
CO2: 26 mEq/L (ref 19–32)
Calcium: 9.3 mg/dL (ref 8.4–10.5)
Chloride: 104 mEq/L (ref 96–112)
Creatinine, Ser: 0.7 mg/dL (ref 0.4–1.2)
GFR: 117.88 mL/min (ref >60.00)
GLUCOSE: 208 mg/dL — AB (ref 70–99)
Potassium: 4 mEq/L (ref 3.5–5.1)
SODIUM: 138 meq/L (ref 135–145)

## 2013-12-31 NOTE — ED Provider Notes (Signed)
I saw and evaluated the patient, reviewed the resident's note and I agree with the findings and plan.   .Face to face Exam:  General:  Awake HEENT:  Atraumatic Resp:  Normal effort Abd:  Nondistended Neuro:No focal weakness  EKG was reviewed and discussed with the resident  CRITICAL CARE Performed by: Leonard Schwartz L Total critical care time: 30 min Critical care time was exclusive of separately billable procedures and treating other patients. Critical care was necessary to treat or prevent imminent or life-threatening deterioration. Critical care was time spent personally by me on the following activities: development of treatment plan with patient and/or surrogate as well as nursing, discussions with consultants, evaluation of patient's response to treatment, examination of patient, obtaining history from patient or surrogate, ordering and performing treatments and interventions, ordering and review of laboratory studies, ordering and review of radiographic studies, pulse oximetry and re-evaluation of patient's condition.   Dot Lanes, MD 12/31/13 [phone]

## 2014-01-01 ENCOUNTER — Other Ambulatory Visit: Payer: Self-pay | Admitting: Emergency Medicine

## 2014-03-01 ENCOUNTER — Ambulatory Visit: Payer: No Typology Code available for payment source | Admitting: Internal Medicine

## 2014-07-02 ENCOUNTER — Encounter: Payer: Self-pay | Admitting: Internal Medicine

## 2014-07-02 ENCOUNTER — Ambulatory Visit: Payer: No Typology Code available for payment source | Attending: Internal Medicine | Admitting: Internal Medicine

## 2014-07-02 VITALS — BP 149/90 | HR 72 | Temp 98.0°F | Resp 14 | Ht 66.0 in | Wt 300.0 lb

## 2014-07-02 DIAGNOSIS — E119 Type 2 diabetes mellitus without complications: Secondary | ICD-10-CM

## 2014-07-02 DIAGNOSIS — F3162 Bipolar disorder, current episode mixed, moderate: Secondary | ICD-10-CM

## 2014-07-02 DIAGNOSIS — N393 Stress incontinence (female) (male): Secondary | ICD-10-CM

## 2014-07-02 LAB — POCT GLYCOSYLATED HEMOGLOBIN (HGB A1C): Hemoglobin A1C: 8.1

## 2014-07-02 LAB — GLUCOSE, POCT (MANUAL RESULT ENTRY): POC Glucose: 122 mg/dl — AB (ref 70–99)

## 2014-07-02 MED ORDER — LORAZEPAM 1 MG PO TABS: 1.0000 mg | ORAL_TABLET | Freq: Every day | ORAL | Status: DC

## 2014-07-02 MED ORDER — RISPERIDONE 4 MG PO TABS: 4.0000 mg | ORAL_TABLET | Freq: Every day | ORAL | Status: DC

## 2014-07-02 MED ORDER — METFORMIN HCL ER (OSM) 1000 MG PO TB24: 1000.0000 mg | ORAL_TABLET | Freq: Every day | ORAL | Status: DC

## 2014-07-02 NOTE — Progress Notes (Signed)
Pt is here following up on her HTN and diabetes. Pt states that she seen her doctor in [place] and the Doctor took her off the insulin.

## 2014-07-02 NOTE — Progress Notes (Signed)
Patient ID: [name], female   DOB: 04-Sep-1966, 48 y.o.   MRN: [id number]   [name], is a 48 y.o. female  YNW:[id number]  MVH:[id number]  DOB - Feb 18, 1966  Chief Complaint  Patient presents with  . Follow-up        Subjective:   [name] is a 48 y.o. female here today for a follow up visit on her HTN and DM.  PMH significant for chronic diastolic heart failure, HTN, DM, SVT, OSA, and bipolar disorder.  She has recently returned home from [place].  During her visit, she developed bilateral LE cellulitis and was admitted to the hospital for IV antibiotic therapy.  She was discharged on a course of oral antibiotics, which she completed.  She was also told to stop her insulin following her admission for hypoglycemia.  Since returning home, she has noticed that her BP and CBG have been higher than they were in [place].  Her CBGs while away ranged from 79-120s.  Since being home they range 100-200s.  She attributes this to dietary indiscretions.  She has noticed some swelling in her ankles but has not taken her lasix for the last three days because "of her menstrual cycle."  She has been experiencing frequent episodes of incontinence over the last year.  She does not feel that it is related to the use of lasix as these episodes occur when she does not take the medication.  She does not weigh herself daily because she does not believe that her weight is correct as she has had to buy smaller sized clothes.  While in [place], she was seen by a new psychiatrist who changed her bipolar medications to Risperdal and lorazepam.  She reports that her moods are well controlled on this regimen.  She denies suicidal or homicidal ideations.  She feels that these changes have increased her appetite and she is eating more than normal.  [name] also reports chronically irregular menstrual cycles.  She states she may get a cycle every 3-4 months, lasting 4-7 days of moderate flow.  She is concerned that she is  entering perimenopause and would like a referral to GYN.  She does not smoke or consume alcohol.    ALLERGIES: Allergies  Allergen Reactions  . Acetaminophen     Fits as a child "seizures-like"  . Caffeine     Tense, anxiety, increased urination  . Lisinopril     Rash with lisinopril; but fosinopril is ok per patient    PAST MEDICAL HISTORY: Past Medical History  Diagnosis Date  . Insulin dependent type 2 diabetes mellitus, uncontrolled   . Chronic diastolic CHF (congestive heart failure)   . Morbid obesity with BMI of 50.0-59.9, adult   . Bipolar disease, chronic   . History of thyrotoxicosis   . OSA (obstructive sleep apnea) 03/06/2011  . Chest pain     a. 2012 Myoview: EF 63%, no isch/infarct  . Chronic diastolic congestive heart failure 07/23/2011  . Hypertensive heart disease 10/18/2013    MEDICATIONS AT HOME: Prior to Admission medications   Medication Sig Start Date End Date Taking? Authorizing Provider  amLODipine (NORVASC) 10 MG tablet Take 1 tablet (10 mg total) by mouth daily. 12/10/13  Yes Domenic Polite, MD  fosinopril (MONOPRIL) 40 MG tablet Take 1 tablet (40 mg total) by mouth daily. 12/19/13  Yes Reyne Dumas, MD  furosemide (LASIX) 40 MG tablet Take 2 tablets (80 mg total) by mouth 2 (two) times daily. 11/28/13  Yes Olugbemiga E  Doreene Burke, MD  metFORMIN (GLUCOPHAGE-XR) 500 MG 24 hr tablet Take 1 tablet (500 mg total) by mouth 2 (two) times daily. 11/28/13  Yes Tresa Garter, MD  potassium chloride (K-DUR,KLOR-CON) 10 MEQ tablet Take 10 mEq by mouth 2 (two) times daily.   Yes Historical Provider, MD  ARIPiprazole (ABILIFY) 5 MG tablet Take 2 tablets (10 mg total) by mouth at bedtime. 12/10/13   Domenic Polite, MD  insulin aspart (NOVOLOG) 100 UNIT/ML injection Inject 2-11 Units into the skin 3 (three) times daily before meals. Sliding scale Moderate  CBG: 120-150, take 2 units,  151-200: take 4 units  201-250: take 6 units 251-300: take 8 units 301-350: take 11  units 12/19/13   Reyne Dumas, MD  insulin glargine (LANTUS) 100 UNIT/ML injection Inject 0.3 mLs (30 Units total) into the skin at bedtime. 12/19/13   Reyne Dumas, MD  metoprolol succinate (TOPROL-XL) 50 MG 24 hr tablet Take 1 tablet (50 mg total) by mouth daily. Take with or immediately following a meal. 11/28/13   Tresa Garter, MD  rosuvastatin (CRESTOR) 10 MG tablet Take 1 tablet (10 mg total) by mouth daily. 06/01/13   Ripudeep Krystal Eaton, MD  sertraline (ZOLOFT) 100 MG tablet Take 1 tablet (100 mg total) by mouth daily. 11/28/13   Tresa Garter, MD  traZODone (DESYREL) 100 MG tablet Take 1 tablet (100 mg total) by mouth at bedtime. 12/10/13   Domenic Polite, MD   Review of Systems  Constitutional: Negative.   HENT: Negative.   Eyes: Negative.   Respiratory: Negative.   Cardiovascular: Positive for leg swelling. Negative for chest pain, palpitations, orthopnea, claudication and PND.  Gastrointestinal: Negative.   Genitourinary:       Incontinence  Musculoskeletal: Negative.   Skin: Negative.   Neurological: Negative.   Endo/Heme/Allergies: Negative.   Psychiatric/Behavioral: Negative.      Objective:   Filed Vitals:   07/02/14 1522  BP: 149/90  Pulse: 72  Temp: 98 F (36.7 C)  TempSrc: Oral  Resp: 14  Height: 5\' 6"  (1.676 m)  Weight: 300 lb (136.079 kg)  SpO2: 98%    Exam General appearance : Awake, alert, not in any distress. Speech Clear. Not toxic looking HEENT: Atraumatic and Normocephalic, pupils equally reactive to light and accomodation Neck: supple, no JVD. No cervical lymphadenopathy.  Chest:Good air entry bilaterally, no added sounds  CVS: S1 S2 regular, no murmurs.  Abdomen: Bowel sounds present, Non tender and not distended with no gaurding, rigidity or rebound. Extremities: B/L Lower Ext shows no edema, both legs are warm to touch Neurology: Awake alert, and oriented X 3, CN II-XII intact, Non focal Skin:No Rash Wounds:N/A  Data Review Lab Results   Component Value Date   HGBA1C 8.1 07/02/2014   HGBA1C 7.7* 11/03/2013   HGBA1C 8.8 06/01/2013     Assessment & Plan   1. Type 2 diabetes mellitus without complication  - Glucose (CBG) - HgB A1c [name]'s A1C has increased from 7.7 to 8.1. She has been off insulin for 4 months.  Increase metformin ER to 1,000 mg daily. Reinforced low carb diet.   Aim for 2-3 Carb Choices per meal (30-45 grams) +/- 1 either way  Aim for 0-15 Carbs per snack if hungry  Include protein in moderation with your meals and snacks  Consider reading food labels for Total Carbohydrate and Fat Grams of foods  Consider checking BG at alternate times per day  Continue taking medication as directed Fruit Punch -  find one with no sugar  Measure and decrease portions of carbohydrate foods  Make your plate and don't go back for seconds  2. HTN  She has not taken furosemide in 3 days.  Instructed to take medication daily for management of diastolic heart failure. DASH diet and aerobic exercise discussed.  3. Urinary incontinence  UA to r/o UTI and proteinuria.  Schedule physical examination to r/o anatomical cause for incontinence. ? psychological component as she reports she can control incontinence if she "puts her mind to it."  GYN referral   4. Bipolar  Pt was instructed to f/u with [facility] for management of symptoms and medication refills based on their eval.   The patient was given clear instructions to go to ER or return to medical center if symptoms don't improve, worsen or new problems develop. The patient verbalized understanding. The patient was told to call to get lab results if they haven't heard anything in the next week.   This note has been created with Surveyor, quantity. Any transcriptional errors are unintentional.    Christion Leonhard, Meilissa E   Evaluation and management procedures were performed by the Advanced Practitioner under my supervision  and collaboration. I have reviewed the Advanced Practitioner's note and chart, and I agree with the management and plan.   [name], MD, [place], [employer], [place] and [place], [place]   07/02/2014, 4:01 PM

## 2014-07-02 NOTE — Patient Instructions (Signed)
DASH Eating Plan DASH stands for "Dietary Approaches to Stop Hypertension." The DASH eating plan is a healthy eating plan that has been shown to reduce high blood pressure (hypertension). Additional health benefits may include reducing the risk of type 2 diabetes mellitus, heart disease, and stroke. The DASH eating plan may also help with weight loss. WHAT DO I NEED TO KNOW ABOUT THE DASH EATING PLAN? For the DASH eating plan, you will follow these general guidelines:  Choose foods with a percent daily value for sodium of less than 5% (as listed on the food label).  Use salt-free seasonings or herbs instead of table salt or sea salt.  Check with your health care provider or pharmacist before using salt substitutes.  Eat lower-sodium products, often labeled as "lower sodium" or "no salt added."  Eat fresh foods.  Eat more vegetables, fruits, and low-fat dairy products.  Choose whole grains. Look for the word "whole" as the first word in the ingredient list.  Choose fish and skinless chicken or turkey more often than red meat. Limit fish, poultry, and meat to 6 oz (170 g) each day.  Limit sweets, desserts, sugars, and sugary drinks.  Choose heart-healthy fats.  Limit cheese to 1 oz (28 g) per day.  Eat more home-cooked food and less restaurant, buffet, and fast food.  Limit fried foods.  Cook foods using methods other than frying.  Limit canned vegetables. If you do use them, rinse them well to decrease the sodium.  When eating at a restaurant, ask that your food be prepared with less salt, or no salt if possible. WHAT FOODS CAN I EAT? Seek help from a dietitian for individual calorie needs. Grains Whole grain or whole wheat bread. Brown rice. Whole grain or whole wheat pasta. Quinoa, bulgur, and whole grain cereals. Low-sodium cereals. Corn or whole wheat flour tortillas. Whole grain cornbread. Whole grain crackers. Low-sodium crackers. Vegetables Fresh or frozen vegetables  (raw, steamed, roasted, or grilled). Low-sodium or reduced-sodium tomato and vegetable juices. Low-sodium or reduced-sodium tomato sauce and paste. Low-sodium or reduced-sodium canned vegetables.  Fruits All fresh, canned (in natural juice), or frozen fruits. Meat and Other Protein Products Ground beef (85% or leaner), grass-fed beef, or beef trimmed of fat. Skinless chicken or turkey. Ground chicken or turkey. Pork trimmed of fat. All fish and seafood. Eggs. Dried beans, peas, or lentils. Unsalted nuts and seeds. Unsalted canned beans. Dairy Low-fat dairy products, such as skim or 1% milk, 2% or reduced-fat cheeses, low-fat ricotta or cottage cheese, or plain low-fat yogurt. Low-sodium or reduced-sodium cheeses. Fats and Oils Tub margarines without trans fats. Light or reduced-fat mayonnaise and salad dressings (reduced sodium). Avocado. Safflower, olive, or canola oils. Natural peanut or almond butter. Other Unsalted popcorn and pretzels. The items listed above may not be a complete list of recommended foods or beverages. Contact your dietitian for more options. WHAT FOODS ARE NOT RECOMMENDED? Grains White bread. White pasta. White rice. Refined cornbread. Bagels and croissants. Crackers that contain trans fat. Vegetables Creamed or fried vegetables. Vegetables in a cheese sauce. Regular canned vegetables. Regular canned tomato sauce and paste. Regular tomato and vegetable juices. Fruits Dried fruits. Canned fruit in light or heavy syrup. Fruit juice. Meat and Other Protein Products Fatty cuts of meat. Ribs, chicken wings, bacon, sausage, bologna, salami, chitterlings, fatback, hot dogs, bratwurst, and packaged luncheon meats. Salted nuts and seeds. Canned beans with salt. Dairy Whole or 2% milk, cream, half-and-half, and cream cheese. Whole-fat or sweetened yogurt. Full-fat   cheeses or blue cheese. Nondairy creamers and whipped toppings. Processed cheese, cheese spreads, or cheese  curds. Condiments Onion and garlic salt, seasoned salt, table salt, and sea salt. Canned and packaged gravies. Worcestershire sauce. Tartar sauce. Barbecue sauce. Teriyaki sauce. Soy sauce, including reduced sodium. Steak sauce. Fish sauce. Oyster sauce. Cocktail sauce. Horseradish. Ketchup and mustard. Meat flavorings and tenderizers. Bouillon cubes. Hot sauce. Tabasco sauce. Marinades. Taco seasonings. Relishes. Fats and Oils Butter, stick margarine, lard, shortening, ghee, and bacon fat. Coconut, palm kernel, or palm oils. Regular salad dressings. Other Pickles and olives. Salted popcorn and pretzels. The items listed above may not be a complete list of foods and beverages to avoid. Contact your dietitian for more information. WHERE CAN I FIND MORE INFORMATION? National Heart, Lung, and Blood Institute: www.nhlbi.nih.gov/health/health-topics/topics/dash/ Document Released: 09/03/2011 Document Revised: 01/29/2014 Document Reviewed: 07/19/2013 ExitCare Patient Information 2015 [employer]. This information is not intended to replace advice given to you by your health care provider. Make sure you discuss any questions you have with your health care provider. Hypertension Hypertension, commonly called high blood pressure, is when the force of blood pumping through your arteries is too strong. Your arteries are the blood vessels that carry blood from your heart throughout your body. A blood pressure reading consists of a higher number over a lower number, such as 110/72. The higher number (systolic) is the pressure inside your arteries when your heart pumps. The lower number (diastolic) is the pressure inside your arteries when your heart relaxes. Ideally you want your blood pressure below 120/80. Hypertension forces your heart to work harder to pump blood. Your arteries may become narrow or stiff. Having hypertension puts you at risk for heart disease, stroke, and other problems.  RISK  FACTORS Some risk factors for high blood pressure are controllable. Others are not.  Risk factors you cannot control include:   Race. You may be at higher risk if you are African American.  Age. Risk increases with age.  Gender. Men are at higher risk than women before age 45 years. After age 65, women are at higher risk than men. Risk factors you can control include:  Not getting enough exercise or physical activity.  Being overweight.  Getting too much fat, sugar, calories, or salt in your diet.  Drinking too much alcohol. SIGNS AND SYMPTOMS Hypertension does not usually cause signs or symptoms. Extremely high blood pressure (hypertensive crisis) may cause headache, anxiety, shortness of breath, and nosebleed. DIAGNOSIS  To check if you have hypertension, your health care provider will measure your blood pressure while you are seated, with your arm held at the level of your heart. It should be measured at least twice using the same arm. Certain conditions can cause a difference in blood pressure between your right and left arms. A blood pressure reading that is higher than normal on one occasion does not mean that you need treatment. If one blood pressure reading is high, ask your health care provider about having it checked again. TREATMENT  Treating high blood pressure includes making lifestyle changes and possibly taking medicine. Living a healthy lifestyle can help lower high blood pressure. You may need to change some of your habits. Lifestyle changes may include:  Following the DASH diet. This diet is high in fruits, vegetables, and whole grains. It is low in salt, red meat, and added sugars.  Getting at least 2 hours of brisk physical activity every week.  Losing weight if necessary.  Not smoking.  Limiting   alcoholic beverages.  Learning ways to reduce stress. If lifestyle changes are not enough to get your blood pressure under control, your health care provider may  prescribe medicine. You may need to take more than one. Work closely with your health care provider to understand the risks and benefits. HOME CARE INSTRUCTIONS  Have your blood pressure rechecked as directed by your health care provider.   Take medicines only as directed by your health care provider. Follow the directions carefully. Blood pressure medicines must be taken as prescribed. The medicine does not work as well when you skip doses. Skipping doses also puts you at risk for problems.   Do not smoke.   Monitor your blood pressure at home as directed by your health care provider. SEEK MEDICAL CARE IF:   You think you are having a reaction to medicines taken.  You have recurrent headaches or feel dizzy.  You have swelling in your ankles.  You have trouble with your vision. SEEK IMMEDIATE MEDICAL CARE IF:  You develop a severe headache or confusion.  You have unusual weakness, numbness, or feel faint.  You have severe chest or abdominal pain.  You vomit repeatedly.  You have trouble breathing. MAKE SURE YOU:   Understand these instructions.  Will watch your condition.  Will get help right away if you are not doing well or get worse. Document Released: 09/14/2005 Document Revised: 01/29/2014 Document Reviewed: 07/07/2013 ExitCare Patient Information 2015 [employer]. This information is not intended to replace advice given to you by your health care provider. Make sure you discuss any questions you have with your health care provider.  

## 2014-07-03 LAB — COMPLETE METABOLIC PANEL WITH GFR
ALBUMIN: 4.2 g/dL (ref 3.5–5.2)
ALK PHOS: 98 U/L (ref 39–117)
AST: 18 U/L (ref 0–37)
BILIRUBIN TOTAL: 0.4 mg/dL (ref 0.2–1.2)
BUN: 10 mg/dL (ref 6–23)
CO2: 28 mEq/L (ref 19–32)
Calcium: 9.7 mg/dL (ref 8.4–10.5)
Chloride: 99 mEq/L (ref 96–112)
Creat: 0.59 mg/dL (ref 0.50–1.10)
GFR, Est African American: >89 mL/min
GFR, Est Non African American: >89 mL/min
Glucose, Bld: 111 mg/dL — ABNORMAL HIGH (ref 70–99)
POTASSIUM: 4.8 meq/L (ref 3.5–5.3)
SODIUM: 137 meq/L (ref 135–145)
TOTAL PROTEIN: 7.6 g/dL (ref 6.0–8.3)

## 2014-07-03 LAB — LIPID PANEL
CHOL/HDL RATIO: 4 ratio
Cholesterol: 153 mg/dL (ref 0–200)
HDL: 38 mg/dL — AB (ref >39)
LDL CALC: 66 mg/dL (ref 0–99)
Triglycerides: 246 mg/dL — ABNORMAL HIGH (ref <150)
VLDL: 49 mg/dL — AB (ref 0–40)

## 2014-07-03 LAB — MICROALBUMIN / CREATININE URINE RATIO
Creatinine, Urine: 92.9 mg/dL
Microalb Creat Ratio: 250.8 mg/g — ABNORMAL HIGH (ref 0.0–30.0)
Microalb, Ur: 23.3 mg/dL — ABNORMAL HIGH (ref <2.0)

## 2014-07-16 ENCOUNTER — Telehealth: Payer: Self-pay | Admitting: Emergency Medicine

## 2014-07-16 NOTE — Telephone Encounter (Signed)
Message copied by Ricci Barker on Mon Jul 16, 2014  5:43 PM ------      Message from: [name] E      Created: Wed Jul 11, 2014  6:19 PM       Please inform patient that her laboratory tests results show mild effect of diabetes on the kidneys with slight protein leakage in the urine. Will encourage continue her blood sugar control, take medication regularly, engage in physical exercise as tolerated and low carbohydrate diet. ------

## 2014-07-16 NOTE — Telephone Encounter (Signed)
Pt given lab results with instructions to control blood sugars by diet/exercising more, Stressed to pt the damage Diabetes is effecting kidneys

## 2014-08-02 ENCOUNTER — Ambulatory Visit: Payer: No Typology Code available for payment source | Attending: Internal Medicine | Admitting: Internal Medicine

## 2014-08-02 ENCOUNTER — Encounter: Payer: Self-pay | Admitting: Internal Medicine

## 2014-08-02 VITALS — BP 152/94 | HR 76 | Temp 98.1°F | Resp 14 | Ht 66.0 in | Wt 320.0 lb

## 2014-08-02 DIAGNOSIS — M545 Low back pain, unspecified: Secondary | ICD-10-CM

## 2014-08-02 DIAGNOSIS — E119 Type 2 diabetes mellitus without complications: Secondary | ICD-10-CM | POA: Insufficient documentation

## 2014-08-02 LAB — GLUCOSE, POCT (MANUAL RESULT ENTRY): POC Glucose: 226 mg/dl — AB (ref 70–99)

## 2014-08-02 MED ORDER — AMLODIPINE BESYLATE 10 MG PO TABS: 10.0000 mg | ORAL_TABLET | Freq: Every day | ORAL | Status: DC

## 2014-08-02 MED ORDER — TRAMADOL HCL 50 MG PO TABS: 50.0000 mg | ORAL_TABLET | Freq: Three times a day (TID) | ORAL | Status: DC | PRN

## 2014-08-02 NOTE — Progress Notes (Signed)
Patient ID: [name], female   DOB: Feb 08, 1966, 48 y.o.   MRN: [id number]   [name], is a 48 y.o. female  [id number]  [id number]  DOB - 1966-05-13  Chief Complaint  Patient presents with  . Follow-up        Subjective:   [name] is a 48 y.o. female here today for a follow up visit. Patient with history of diabetes mellitus, morbid obesity, chronic diastolic heart failure, bipolar disorder, obstructive sleep apnea and hypertension came here today for routine follow-up of hypertension and diabetes. Patient reports having low back pain since yesterday. She claims she is gaining weight so rapidly. She is urinating very frequently but no dysuria. Last hemoglobin A1c was 8.1%. Her urine dipstick today is negative for infection. Patient has No headache, No chest pain, No abdominal pain - No Nausea, No new weakness tingling or numbness, No Cough - SOB.  No problems updated.  ALLERGIES: Allergies  Allergen Reactions  . Acetaminophen     Fits as a child "seizures-like"  . Caffeine     Tense, anxiety, increased urination  . Lisinopril     Rash with lisinopril; but fosinopril is ok per patient    PAST MEDICAL HISTORY: Past Medical History  Diagnosis Date  . Insulin dependent type 2 diabetes mellitus, uncontrolled   . Chronic diastolic CHF (congestive heart failure)   . Morbid obesity with BMI of 50.0-59.9, adult   . Bipolar disease, chronic   . History of thyrotoxicosis   . OSA (obstructive sleep apnea) 03/06/2011  . Chest pain     a. 2012 Myoview: EF 63%, no isch/infarct  . Chronic diastolic congestive heart failure 07/23/2011  . Hypertensive heart disease 10/18/2013    MEDICATIONS AT HOME: Prior to Admission medications   Medication Sig Start Date End Date Taking? Authorizing Provider  amLODipine (NORVASC) 10 MG tablet Take 1 tablet (10 mg total) by mouth daily. 08/02/14  Yes Tresa Garter, MD  fosinopril (MONOPRIL) 40 MG tablet Take 1 tablet (40 mg  total) by mouth daily. 12/19/13  Yes Reyne Dumas, MD  furosemide (LASIX) 40 MG tablet Take 2 tablets (80 mg total) by mouth 2 (two) times daily. 11/28/13  Yes Elige Shouse Essie Christine, MD  LORazepam (ATIVAN) 1 MG tablet Take 1 tablet (1 mg total) by mouth at bedtime. 07/02/14  Yes Tresa Garter, MD  metFORMIN (FORTAMET) 1000 MG (OSM) 24 hr tablet Take 1 tablet (1,000 mg total) by mouth daily with breakfast. 07/02/14  Yes Tresa Garter, MD  potassium chloride (K-DUR,KLOR-CON) 10 MEQ tablet Take 10 mEq by mouth 2 (two) times daily.   Yes Historical Provider, MD  metoprolol succinate (TOPROL-XL) 50 MG 24 hr tablet Take 1 tablet (50 mg total) by mouth daily. Take with or immediately following a meal. 11/28/13   Tresa Garter, MD  risperidone (RISPERDAL) 4 MG tablet Take 1 tablet (4 mg total) by mouth daily. 07/02/14   Tresa Garter, MD  rosuvastatin (CRESTOR) 10 MG tablet Take 1 tablet (10 mg total) by mouth daily. 06/01/13   Ripudeep Krystal Eaton, MD  traMADol (ULTRAM) 50 MG tablet Take 1 tablet (50 mg total) by mouth every 8 (eight) hours as needed. 08/02/14   Tresa Garter, MD     Objective:   Filed Vitals:   08/02/14 1245  BP: 152/94    Exam General appearance : Awake, alert, not in any distress. Speech Clear. Not toxic looking HEENT: Atraumatic and Normocephalic, pupils equally reactive to  light and accomodation Neck: supple, no JVD. No cervical lymphadenopathy.  Chest:Good air entry bilaterally, no added sounds  CVS: S1 S2 regular, no murmurs.  Abdomen: Bowel sounds present, Non tender and not distended with no gaurding, rigidity or rebound. Extremities: B/L Lower Ext shows no edema, both legs are warm to touch Neurology: Awake alert, and oriented X 3, CN II-XII intact, Non focal Skin:No Rash Wounds:N/A  Data Review Lab Results  Component Value Date   HGBA1C 8.1 07/02/2014   HGBA1C 7.7* 11/03/2013   HGBA1C 8.8 06/01/2013     Assessment & Plan   1. Type 2 diabetes  mellitus without complication  - Glucose (CBG)  Continue current medications. Aim for 2-3 Carb Choices per meal (30-45 grams) +/- 1 either way  Aim for 0-15 Carbs per snack if hungry  Include protein in moderation with your meals and snacks  Consider reading food labels for Total Carbohydrate and Fat Grams of foods  Consider checking BG at alternate times per day  Continue taking medication as directed Fruit Punch - find one with no sugar  Measure and decrease portions of carbohydrate foods  Make your plate and don't go back for seconds   2. Midline low back pain without sciatica  - traMADol (ULTRAM) 50 MG tablet; Take 1 tablet (50 mg total) by mouth every 8 (eight) hours as needed.  Dispense: 60 tablet; Refill: 0   Return in about 3 months (around 11/02/2014), or if symptoms worsen or fail to improve, for Heart Failure and Hypertension, Follow up Pain and comorbidities.  The patient was given clear instructions to go to ER or return to medical center if symptoms don't improve, worsen or new problems develop. The patient verbalized understanding. The patient was told to call to get lab results if they haven't heard anything in the next week.   This note has been created with Surveyor, quantity. Any transcriptional errors are unintentional.    [name], MD, [place], [place], [place] and [place], [place]   08/02/2014, 1:08 PM

## 2014-08-02 NOTE — Progress Notes (Signed)
Patient ID: [name], female   DOB: May 23, 1966, 48 y.o.   MRN: [id number]   [name], is a 48 y.o. female  [id number]  [id number]  DOB - 16-Jan-1966  Chief Complaint  Patient presents with  . Follow-up        Subjective:   [name] is a 48 y.o. female here today for a follow up visit. PMH significant CHF, HTN, DM, bipolar, obesity, OSA.  She is here wanting to discuss her recent labs.  She is also complaining of weight gain and SOB for several days.  She stopped taking her furosemide because increased urination.  She has not been watching her salt or carbohydrate consumption. She is complaining of lower back pain that started yesterday.  She does not recall any injury or trauma to the area.  She is requesting a refill of her amlodipine.       No problems updated.  ALLERGIES: Allergies  Allergen Reactions  . Acetaminophen     Fits as a child "seizures-like"  . Caffeine     Tense, anxiety, increased urination  . Lisinopril     Rash with lisinopril; but fosinopril is ok per patient    PAST MEDICAL HISTORY: Past Medical History  Diagnosis Date  . Insulin dependent type 2 diabetes mellitus, uncontrolled   . Chronic diastolic CHF (congestive heart failure)   . Morbid obesity with BMI of 50.0-59.9, adult   . Bipolar disease, chronic   . History of thyrotoxicosis   . OSA (obstructive sleep apnea) 03/06/2011  . Chest pain     a. 2012 Myoview: EF 63%, no isch/infarct  . Chronic diastolic congestive heart failure 07/23/2011  . Hypertensive heart disease 10/18/2013    MEDICATIONS AT HOME: Prior to Admission medications   Medication Sig Start Date End Date Taking? Authorizing Provider  amLODipine (NORVASC) 10 MG tablet Take 1 tablet (10 mg total) by mouth daily. 08/02/14  Yes Tresa Garter, MD  fosinopril (MONOPRIL) 40 MG tablet Take 1 tablet (40 mg total) by mouth daily. 12/19/13  Yes Reyne Dumas, MD  furosemide (LASIX) 40 MG tablet Take 2 tablets (80 mg  total) by mouth 2 (two) times daily. 11/28/13  Yes Olugbemiga Essie Christine, MD  LORazepam (ATIVAN) 1 MG tablet Take 1 tablet (1 mg total) by mouth at bedtime. 07/02/14  Yes Tresa Garter, MD  metFORMIN (FORTAMET) 1000 MG (OSM) 24 hr tablet Take 1 tablet (1,000 mg total) by mouth daily with breakfast. 07/02/14  Yes Tresa Garter, MD  potassium chloride (K-DUR,KLOR-CON) 10 MEQ tablet Take 10 mEq by mouth 2 (two) times daily.   Yes Historical Provider, MD  metoprolol succinate (TOPROL-XL) 50 MG 24 hr tablet Take 1 tablet (50 mg total) by mouth daily. Take with or immediately following a meal. 11/28/13   Tresa Garter, MD  risperidone (RISPERDAL) 4 MG tablet Take 1 tablet (4 mg total) by mouth daily. 07/02/14   Tresa Garter, MD  rosuvastatin (CRESTOR) 10 MG tablet Take 1 tablet (10 mg total) by mouth daily. 06/01/13   Ripudeep Krystal Eaton, MD   Review of Systems  Constitutional: Negative.   HENT: Negative.   Eyes: Negative.   Respiratory: Positive for shortness of breath. Negative for cough, hemoptysis, sputum production and wheezing.   Cardiovascular: Positive for orthopnea and leg swelling. Negative for chest pain.  Genitourinary: Positive for urgency and frequency. Negative for dysuria, hematuria and flank pain.  Musculoskeletal: Positive for back pain.  Skin: Negative.   Neurological:  Negative.   Endo/Heme/Allergies: Negative.   Psychiatric/Behavioral: Negative.      Objective:   Filed Vitals:   08/02/14 1245  BP: 152/94    Exam General appearance : Awake, alert, not in any distress. Speech shortened. Not toxic looking HEENT: Atraumatic and Normocephalic, pupils equally reactive to light and accomodation Neck: supple, no JVD. No cervical lymphadenopathy.  Chest:Good air entry bilaterally, diminished in the bases.  No added sounds.  CVS: S1 S2 regular, no murmurs.  Abdomen: Bowel sounds present, Non tender and not distended with no gaurding, rigidity or rebound. Extremities:  B/L Lower Ext shows with 2+ edema, both legs are warm to touch Neurology: Awake alert, and oriented X 3, CN II-XII intact, Non focal Skin:No Rash Wounds:N/A  Data Review Lab Results  Component Value Date   HGBA1C 8.1 07/02/2014   HGBA1C 7.7* 11/03/2013   HGBA1C 8.8 06/01/2013     Assessment & Plan   1. Type 2 diabetes mellitus without complication P: - Glucose (CBG) -Continue current regimen.  Strict dietary control encouraged.  2. Midline low back pain without sciatica P: Tramadol 50 mg PRN back pain. Encouraged weight reduction.  3. CHF P: Pt instructed that she must take furosemide daily to prevent CHF exacerbations/hospitalization. Pt verbalized understanding.     The patient was given clear instructions to go to ER or return to medical center if symptoms don't improve, worsen or new problems develop. The patient verbalized understanding. The patient was told to call to get lab results if they haven't heard anything in the next week.   This note has been created with Surveyor, quantity. Any transcriptional errors are unintentional.    Anjannette Gauger, FNP-student  08/02/2014, 1:07 PM

## 2014-08-02 NOTE — Progress Notes (Signed)
Pt is here today following up on her HTN and diabetes. Pt reports having lower back pain since yesterday. Pt is gaining weight. Pt states that she urinates too frequently.

## 2014-08-02 NOTE — Patient Instructions (Signed)
Back Pain, Adult Low back pain is very common. About 1 in 5 people have back pain.The cause of low back pain is rarely dangerous. The pain often gets better over time.About half of people with a sudden onset of back pain feel better in just 2 weeks. About 8 in 10 people feel better by 6 weeks.  CAUSES Some common causes of back pain include:  Strain of the muscles or ligaments supporting the spine.  Wear and tear (degeneration) of the spinal discs.  Arthritis.  Direct injury to the back. DIAGNOSIS Most of the time, the direct cause of low back pain is not known.However, back pain can be treated effectively even when the exact cause of the pain is unknown.Answering your caregiver's questions about your overall health and symptoms is one of the most accurate ways to make sure the cause of your pain is not dangerous. If your caregiver needs more information, he or she may order lab work or imaging tests (X-rays or MRIs).However, even if imaging tests show changes in your back, this usually does not require surgery. HOME CARE INSTRUCTIONS For many people, back pain returns.Since low back pain is rarely dangerous, it is often a condition that people can learn to manageon their own.   Remain active. It is stressful on the back to sit or stand in one place. Do not sit, drive, or stand in one place for more than 30 minutes at a time. Take short walks on level surfaces as soon as pain allows.Try to increase the length of time you walk each day.  Do not stay in bed.Resting more than 1 or 2 days can delay your recovery.  Do not avoid exercise or work.Your body is made to move.It is not dangerous to be active, even though your back may hurt.Your back will likely heal faster if you return to being active before your pain is gone.  Pay attention to your body when you bend and lift. Many people have less discomfortwhen lifting if they bend their knees, keep the load close to their bodies,and  avoid twisting. Often, the most comfortable positions are those that put less stress on your recovering back.  Find a comfortable position to sleep. Use a firm mattress and lie on your side with your knees slightly bent. If you lie on your back, put a pillow under your knees.  Only take over-the-counter or prescription medicines as directed by your caregiver. Over-the-counter medicines to reduce pain and inflammation are often the most helpful.Your caregiver may prescribe muscle relaxant drugs.These medicines help dull your pain so you can more quickly return to your normal activities and healthy exercise.  Put ice on the injured area.  Put ice in a plastic bag.  Place a towel between your skin and the bag.  Leave the ice on for 15-20 minutes, 03-04 times a day for the first 2 to 3 days. After that, ice and heat may be alternated to reduce pain and spasms.  Ask your caregiver about trying back exercises and gentle massage. This may be of some benefit.  Avoid feeling anxious or stressed.Stress increases muscle tension and can worsen back pain.It is important to recognize when you are anxious or stressed and learn ways to manage it.Exercise is a great option. SEEK MEDICAL CARE IF:  You have pain that is not relieved with rest or medicine.  You have pain that does not improve in 1 week.  You have new symptoms.  You are generally not feeling well. SEEK   IMMEDIATE MEDICAL CARE IF:   You have pain that radiates from your back into your legs.  You develop new bowel or bladder control problems.  You have unusual weakness or numbness in your arms or legs.  You develop nausea or vomiting.  You develop abdominal pain.  You feel faint. Document Released: 09/14/2005 Document Revised: 03/15/2012 Document Reviewed: 01/16/2014 Exit[employer] Patient Information 2015 [employer]. This information is not intended to replace advice given to you by your health care provider. Make sure you  discuss any questions you have with your health care provider.  

## 2014-09-10 ENCOUNTER — Encounter: Payer: Self-pay | Admitting: Internal Medicine

## 2014-09-10 ENCOUNTER — Ambulatory Visit: Payer: No Typology Code available for payment source | Attending: Internal Medicine | Admitting: Internal Medicine

## 2014-09-10 VITALS — BP 140/86 | HR 66 | Temp 97.7°F | Resp 16 | Ht 67.0 in | Wt 315.0 lb

## 2014-09-10 DIAGNOSIS — I10 Essential (primary) hypertension: Secondary | ICD-10-CM | POA: Insufficient documentation

## 2014-09-10 DIAGNOSIS — Z6841 Body Mass Index (BMI) 40.0 and over, adult: Secondary | ICD-10-CM | POA: Insufficient documentation

## 2014-09-10 DIAGNOSIS — F3162 Bipolar disorder, current episode mixed, moderate: Secondary | ICD-10-CM

## 2014-09-10 DIAGNOSIS — Z794 Long term (current) use of insulin: Secondary | ICD-10-CM | POA: Insufficient documentation

## 2014-09-10 DIAGNOSIS — G4733 Obstructive sleep apnea (adult) (pediatric): Secondary | ICD-10-CM | POA: Insufficient documentation

## 2014-09-10 DIAGNOSIS — E119 Type 2 diabetes mellitus without complications: Secondary | ICD-10-CM | POA: Insufficient documentation

## 2014-09-10 DIAGNOSIS — M545 Low back pain, unspecified: Secondary | ICD-10-CM

## 2014-09-10 DIAGNOSIS — Z79899 Other long term (current) drug therapy: Secondary | ICD-10-CM | POA: Insufficient documentation

## 2014-09-10 DIAGNOSIS — F319 Bipolar disorder, unspecified: Secondary | ICD-10-CM | POA: Insufficient documentation

## 2014-09-10 LAB — POCT URINALYSIS DIPSTICK
BILIRUBIN UA: NEGATIVE
GLUCOSE UA: NEGATIVE
Ketones, UA: NEGATIVE
NITRITE UA: NEGATIVE
Protein, UA: >300
Spec Grav, UA: 1.025
Urobilinogen, UA: 1
pH, UA: 6.5

## 2014-09-10 LAB — GLUCOSE, POCT (MANUAL RESULT ENTRY): POC GLUCOSE: 221 mg/dL — AB (ref 70–99)

## 2014-09-10 MED ORDER — AMLODIPINE BESYLATE 10 MG PO TABS: 10.0000 mg | ORAL_TABLET | Freq: Every day | ORAL | Status: DC

## 2014-09-10 MED ORDER — RISPERIDONE 4 MG PO TABS: 4.0000 mg | ORAL_TABLET | Freq: Every day | ORAL | Status: DC

## 2014-09-10 MED ORDER — FOSINOPRIL SODIUM 40 MG PO TABS: 40.0000 mg | ORAL_TABLET | Freq: Every day | ORAL | Status: DC

## 2014-09-10 MED ORDER — FUROSEMIDE 80 MG PO TABS: 80.0000 mg | ORAL_TABLET | Freq: Every day | ORAL | Status: DC

## 2014-09-10 MED ORDER — TRAMADOL HCL 50 MG PO TABS: 50.0000 mg | ORAL_TABLET | Freq: Two times a day (BID) | ORAL | Status: DC | PRN

## 2014-09-10 MED ORDER — POTASSIUM CHLORIDE CRYS ER 10 MEQ PO TBCR: 10.0000 meq | EXTENDED_RELEASE_TABLET | Freq: Two times a day (BID) | ORAL | Status: DC

## 2014-09-10 MED ORDER — METFORMIN HCL 1000 MG PO TABS: 1000.0000 mg | ORAL_TABLET | Freq: Two times a day (BID) | ORAL | Status: DC

## 2014-09-10 NOTE — Progress Notes (Signed)
Patient ID: [name], female   DOB: 05-02-1966, 48 y.o.   MRN: [id number]   [name], is a 48 y.o. female  [id number]  [id number]  DOB - 10-03-1965  Chief Complaint  Patient presents with  . Follow-up    recheck urine   . Diabetes  . Medication Refill        Subjective:   [name] is a 48 y.o. female here today for a follow up visit. Patient has a history of diabetes mellitus, chronic diastolic CHF, bipolar disorder, obstructive sleep apnea on sleep, morbid obesity, hypertension, here today for routine follow-up and medication refill. Patient is extremely noncompliant with both medication and nutrition. Patient continues to eat whatever she likes to eat including food containing high carbohydrate content and high glycemic index, she does not exercise, and she does not take her medications as prescribed. She has no significant complaint today except for ongoing polyuria or polydipsia. She is on Lasix 60 mg tablet by mouth daily for diastolic heart failure, lately she has noticed increasing cough and shortness of breath including paroxysmal nocturnal dyspnea and orthopnea. Patient does not smoke cigarettes, she does not drink alcohol. She complains that her sleep apnea machine is not effective anymore, needs re-calibration. Patient has No headache, No chest pain, No abdominal pain - No Nausea, No new weakness tingling or numbness.  Problem  Midline Low Back Pain Without Sciatica    ALLERGIES: Allergies  Allergen Reactions  . Acetaminophen     Fits as a child "seizures-like"  . Caffeine     Tense, anxiety, increased urination  . Lisinopril     Rash with lisinopril; but fosinopril is ok per patient    PAST MEDICAL HISTORY: Past Medical History  Diagnosis Date  . Insulin dependent type 2 diabetes mellitus, uncontrolled   . Chronic diastolic CHF (congestive heart failure)   . Morbid obesity with BMI of 50.0-59.9, adult   . Bipolar disease, chronic   . History  of thyrotoxicosis   . OSA (obstructive sleep apnea) 03/06/2011  . Chest pain     a. 2012 Myoview: EF 63%, no isch/infarct  . Chronic diastolic congestive heart failure 07/23/2011  . Hypertensive heart disease 10/18/2013    MEDICATIONS AT HOME: Prior to Admission medications   Medication Sig Start Date End Date Taking? Authorizing Provider  amLODipine (NORVASC) 10 MG tablet Take 1 tablet (10 mg total) by mouth daily. 09/10/14  Yes Tresa Garter, MD  fosinopril (MONOPRIL) 40 MG tablet Take 1 tablet (40 mg total) by mouth daily. 09/10/14  Yes Tresa Garter, MD  furosemide (LASIX) 80 MG tablet Take 1 tablet (80 mg total) by mouth daily. 09/10/14  Yes Tresa Garter, MD  metoprolol succinate (TOPROL-XL) 50 MG 24 hr tablet Take 1 tablet (50 mg total) by mouth daily. Take with or immediately following a meal. 11/28/13  Yes Oakley Orban E Doreene Burke, MD  risperidone (RISPERDAL) 4 MG tablet Take 1 tablet (4 mg total) by mouth daily. 09/10/14  Yes Jacey Pelc Essie Christine, MD  traMADol (ULTRAM) 50 MG tablet Take 1 tablet (50 mg total) by mouth every 12 (twelve) hours as needed. 09/10/14  Yes Juwuan Sedita Essie Christine, MD  LORazepam (ATIVAN) 1 MG tablet Take 1 tablet (1 mg total) by mouth at bedtime. Patient not taking: Reported on 09/10/2014 07/02/14   Tresa Garter, MD  metFORMIN (GLUCOPHAGE) 1000 MG tablet Take 1 tablet (1,000 mg total) by mouth 2 (two) times daily with a meal. 09/10/14   Cathy Ropp  Essie Christine, MD  potassium chloride (K-DUR,KLOR-CON) 10 MEQ tablet Take 1 tablet (10 mEq total) by mouth 2 (two) times daily. 09/10/14   Tresa Garter, MD  rosuvastatin (CRESTOR) 10 MG tablet Take 1 tablet (10 mg total) by mouth daily. Patient not taking: Reported on 09/10/2014 06/01/13   Ripudeep Krystal Eaton, MD     Objective:   Filed Vitals:   09/10/14 1209  BP: 140/86  Pulse: 66  Temp: 97.7 F (36.5 C)  TempSrc: Oral  Resp: 16  Height: 5\' 7"  (1.702 m)  Weight: 315 lb (142.883 kg)  SpO2: 97%     Exam General appearance : Awake, alert, not in any distress. Speech Clear. Not toxic looking, morbidly obese HEENT: Atraumatic and Normocephalic, pupils equally reactive to light and accomodation Neck: supple, no JVD. No cervical lymphadenopathy.  Chest:Good air entry bilaterally, no added sounds  CVS: S1 S2 regular, no murmurs.  Abdomen: Bowel sounds present, Non tender and not distended with no gaurding, rigidity or rebound. Extremities: B/L Lower Ext shows no edema, both legs are warm to touch Neurology: Awake alert, and oriented X 3, CN II-XII intact, Non focal Skin:No Rash Wounds:N/A  Data Review Lab Results  Component Value Date   HGBA1C 8.1 07/02/2014   HGBA1C 7.7* 11/03/2013   HGBA1C 8.8 06/01/2013   Urinalysis today showed mild proteinuria otherwise not remarkable, no ketone  Assessment & Plan   1. Type 2 diabetes mellitus without complication  - Glucose (CBG) - Urinalysis Dipstick  - metFORMIN (GLUCOPHAGE) 1000 MG tablet; Take 1 tablet (1,000 mg total) by mouth 2 (two) times daily with a meal.  Dispense: 180 tablet; Refill: 3  2. Midline low back pain without sciatica  - traMADol (ULTRAM) 50 MG tablet; Take 1 tablet (50 mg total) by mouth every 12 (twelve) hours as needed.  Dispense: 60 tablet; Refill: 0  3. Bipolar 1 disorder, mixed, moderate Refill - risperidone (RISPERDAL) 4 MG tablet; Take 1 tablet (4 mg total) by mouth daily.  Dispense: 30 tablet; Refill: 3  4. Essential hypertension, benign Refill - potassium chloride (K-DUR,KLOR-CON) 10 MEQ tablet; Take 1 tablet (10 mEq total) by mouth 2 (two) times daily.  Dispense: 180 tablet; Refill: 3  Increase furosemide to 80 mg tablet by mouth daily from 60 mg tablet by mouth daily considering her increasing shortness of breath lately  - furosemide (LASIX) 80 MG tablet; Take 1 tablet (80 mg total) by mouth daily.  Dispense: 90 tablet; Refill: 3  - fosinopril (MONOPRIL) 40 MG tablet; Take 1 tablet (40 mg  total) by mouth daily.  Dispense: 90 tablet; Refill: 3  - amLODipine (NORVASC) 10 MG tablet; Take 1 tablet (10 mg total) by mouth daily.  Dispense: 90 tablet; Refill: 3  - DASH diet  Patient was again extensively counseled about nutrition and exercise Patient was advised to call advised home care and get her sleep apnea machine recalibrated   Return in about 3 months (around 12/10/2014), or if symptoms worsen or fail to improve, for Hemoglobin A1C and Follow up, DM, Follow up HTN.  The patient was given clear instructions to go to ER or return to medical center if symptoms don't improve, worsen or new problems develop. The patient verbalized understanding. The patient was told to call to get lab results if they haven't heard anything in the next week.   This note has been created with Surveyor, quantity. Any transcriptional errors are unintentional.    Jerami Tammen,  Gabrielle Dare, MD, [place], [facility] and [place], [place]   09/10/2014, 12:44 PM

## 2014-09-10 NOTE — Patient Instructions (Signed)
DASH Eating Plan DASH stands for "Dietary Approaches to Stop Hypertension." The DASH eating plan is a healthy eating plan that has been shown to reduce high blood pressure (hypertension). Additional health benefits may include reducing the risk of type 2 diabetes mellitus, heart disease, and stroke. The DASH eating plan may also help with weight loss. WHAT DO I NEED TO KNOW ABOUT THE DASH EATING PLAN? For the DASH eating plan, you will follow these general guidelines:  Choose foods with a percent daily value for sodium of less than 5% (as listed on the food label).  Use salt-free seasonings or herbs instead of table salt or sea salt.  Check with your health care provider or pharmacist before using salt substitutes.  Eat lower-sodium products, often labeled as "lower sodium" or "no salt added."  Eat fresh foods.  Eat more vegetables, fruits, and low-fat dairy products.  Choose whole grains. Look for the word "whole" as the first word in the ingredient list.  Choose fish and skinless chicken or turkey more often than red meat. Limit fish, poultry, and meat to 6 oz (170 g) each day.  Limit sweets, desserts, sugars, and sugary drinks.  Choose heart-healthy fats.  Limit cheese to 1 oz (28 g) per day.  Eat more home-cooked food and less restaurant, buffet, and fast food.  Limit fried foods.  Cook foods using methods other than frying.  Limit canned vegetables. If you do use them, rinse them well to decrease the sodium.  When eating at a restaurant, ask that your food be prepared with less salt, or no salt if possible. WHAT FOODS CAN I EAT? Seek help from a dietitian for individual calorie needs. Grains Whole grain or whole wheat bread. Brown rice. Whole grain or whole wheat pasta. Quinoa, bulgur, and whole grain cereals. Low-sodium cereals. Corn or whole wheat flour tortillas. Whole grain cornbread. Whole grain crackers. Low-sodium crackers. Vegetables Fresh or frozen vegetables  (raw, steamed, roasted, or grilled). Low-sodium or reduced-sodium tomato and vegetable juices. Low-sodium or reduced-sodium tomato sauce and paste. Low-sodium or reduced-sodium canned vegetables.  Fruits All fresh, canned (in natural juice), or frozen fruits. Meat and Other Protein Products Ground beef (85% or leaner), grass-fed beef, or beef trimmed of fat. Skinless chicken or turkey. Ground chicken or turkey. Pork trimmed of fat. All fish and seafood. Eggs. Dried beans, peas, or lentils. Unsalted nuts and seeds. Unsalted canned beans. Dairy Low-fat dairy products, such as skim or 1% milk, 2% or reduced-fat cheeses, low-fat ricotta or cottage cheese, or plain low-fat yogurt. Low-sodium or reduced-sodium cheeses. Fats and Oils Tub margarines without trans fats. Light or reduced-fat mayonnaise and salad dressings (reduced sodium). Avocado. Safflower, olive, or canola oils. Natural peanut or almond butter. Other Unsalted popcorn and pretzels. The items listed above may not be a complete list of recommended foods or beverages. Contact your dietitian for more options. WHAT FOODS ARE NOT RECOMMENDED? Grains White bread. White pasta. White rice. Refined cornbread. Bagels and croissants. Crackers that contain trans fat. Vegetables Creamed or fried vegetables. Vegetables in a cheese sauce. Regular canned vegetables. Regular canned tomato sauce and paste. Regular tomato and vegetable juices. Fruits Dried fruits. Canned fruit in light or heavy syrup. Fruit juice. Meat and Other Protein Products Fatty cuts of meat. Ribs, chicken wings, bacon, sausage, bologna, salami, chitterlings, fatback, hot dogs, bratwurst, and packaged luncheon meats. Salted nuts and seeds. Canned beans with salt. Dairy Whole or 2% milk, cream, half-and-half, and cream cheese. Whole-fat or sweetened yogurt. Full-fat   cheeses or blue cheese. Nondairy creamers and whipped toppings. Processed cheese, cheese spreads, or cheese  curds. Condiments Onion and garlic salt, seasoned salt, table salt, and sea salt. Canned and packaged gravies. Worcestershire sauce. Tartar sauce. Barbecue sauce. Teriyaki sauce. Soy sauce, including reduced sodium. Steak sauce. Fish sauce. Oyster sauce. Cocktail sauce. Horseradish. Ketchup and mustard. Meat flavorings and tenderizers. Bouillon cubes. Hot sauce. Tabasco sauce. Marinades. Taco seasonings. Relishes. Fats and Oils Butter, stick margarine, lard, shortening, ghee, and bacon fat. Coconut, palm kernel, or palm oils. Regular salad dressings. Other Pickles and olives. Salted popcorn and pretzels. The items listed above may not be a complete list of foods and beverages to avoid. Contact your dietitian for more information. WHERE CAN I FIND MORE INFORMATION? National Heart, Lung, and Blood Institute: travelstabloid.com Document Released: 09/03/2011 Document Revised: 01/29/2014 Document Reviewed: 07/19/2013 [facility] Patient Information 2015 [place], [place]. This information is not intended to replace advice given to you by your health care provider. Make sure you discuss any questions you have with your health care provider. Hypertension Hypertension, commonly called high blood pressure, is when the force of blood pumping through your arteries is too strong. Your arteries are the blood vessels that carry blood from your heart throughout your body. A blood pressure reading consists of a higher number over a lower number, such as 110/72. The higher number (systolic) is the pressure inside your arteries when your heart pumps. The lower number (diastolic) is the pressure inside your arteries when your heart relaxes. Ideally you want your blood pressure below 120/80. Hypertension forces your heart to work harder to pump blood. Your arteries may become narrow or stiff. Having hypertension puts you at risk for heart disease, stroke, and other problems.  RISK  FACTORS Some risk factors for high blood pressure are controllable. Others are not.  Risk factors you cannot control include:   Race. You may be at higher risk if you are African American.  Age. Risk increases with age.  Gender. Men are at higher risk than women before age 37 years. After age 55, women are at higher risk than men. Risk factors you can control include:  Not getting enough exercise or physical activity.  Being overweight.  Getting too much fat, sugar, calories, or salt in your diet.  Drinking too much alcohol. SIGNS AND SYMPTOMS Hypertension does not usually cause signs or symptoms. Extremely high blood pressure (hypertensive crisis) may cause headache, anxiety, shortness of breath, and nosebleed. DIAGNOSIS  To check if you have hypertension, your health care provider will measure your blood pressure while you are seated, with your arm held at the level of your heart. It should be measured at least twice using the same arm. Certain conditions can cause a difference in blood pressure between your right and left arms. A blood pressure reading that is higher than normal on one occasion does not mean that you need treatment. If one blood pressure reading is high, ask your health care provider about having it checked again. TREATMENT  Treating high blood pressure includes making lifestyle changes and possibly taking medicine. Living a healthy lifestyle can help lower high blood pressure. You may need to change some of your habits. Lifestyle changes may include:  Following the DASH diet. This diet is high in fruits, vegetables, and whole grains. It is low in salt, red meat, and added sugars.  Getting at least 2 hours of brisk physical activity every week.  Losing weight if necessary.  Not smoking.  Limiting  alcoholic beverages.  Learning ways to reduce stress. If lifestyle changes are not enough to get your blood pressure under control, your health care provider may  prescribe medicine. You may need to take more than one. Work closely with your health care provider to understand the risks and benefits. HOME CARE INSTRUCTIONS  Have your blood pressure rechecked as directed by your health care provider.   Take medicines only as directed by your health care provider. Follow the directions carefully. Blood pressure medicines must be taken as prescribed. The medicine does not work as well when you skip doses. Skipping doses also puts you at risk for problems.   Do not smoke.   Monitor your blood pressure at home as directed by your health care provider. SEEK MEDICAL CARE IF:   You think you are having a reaction to medicines taken.  You have recurrent headaches or feel dizzy.  You have swelling in your ankles.  You have trouble with your vision. SEEK IMMEDIATE MEDICAL CARE IF:  You develop a severe headache or confusion.  You have unusual weakness, numbness, or feel faint.  You have severe chest or abdominal pain.  You vomit repeatedly.  You have trouble breathing. MAKE SURE YOU:   Understand these instructions.  Will watch your condition.  Will get help right away if you are not doing well or get worse. Document Released: 09/14/2005 Document Revised: 01/29/2014 Document Reviewed: 07/07/2013 [employer] Patient Information 2015 [employer]. This information is not intended to replace advice given to you by your health care provider. Make sure you discuss any questions you have with your health care provider. Basic Carbohydrate Counting for Diabetes Mellitus Carbohydrate counting is a method for keeping track of the amount of carbohydrates you eat. Eating carbohydrates naturally increases the level of sugar (glucose) in your blood, so it is important for you to know the amount that is okay for you to have in every meal. Carbohydrate counting helps keep the level of glucose in your blood within normal limits. The amount of carbohydrates  allowed is different for every person. A dietitian can help you calculate the amount that is right for you. Once you know the amount of carbohydrates you can have, you can count the carbohydrates in the foods you want to eat. Carbohydrates are found in the following foods:  Grains, such as breads and cereals.  Dried beans and soy products.  Starchy vegetables, such as potatoes, peas, and corn.  Fruit and fruit juices.  Milk and yogurt.  Sweets and snack foods, such as cake, cookies, candy, chips, soft drinks, and fruit drinks. CARBOHYDRATE COUNTING There are two ways to count the carbohydrates in your food. You can use either of the methods or a combination of both. Reading the "Nutrition Facts" on Packaged Food The "Nutrition Facts" is an area that is included on the labels of almost all packaged food and beverages in the [place]. It includes the serving size of that food or beverage and information about the nutrients in each serving of the food, including the grams (g) of carbohydrate per serving.  Decide the number of servings of this food or beverage that you will be able to eat or drink. Multiply that number of servings by the number of grams of carbohydrate that is listed on the label for that serving. The total will be the amount of carbohydrates you will be having when you eat or drink this food or beverage. Learning Standard Serving Sizes of Food When you   eat food that is not packaged or does not include "Nutrition Facts" on the label, you need to measure the servings in order to count the amount of carbohydrates.A serving of most carbohydrate-rich foods contains about 15 g of carbohydrates. The following list includes serving sizes of carbohydrate-rich foods that provide 15 g ofcarbohydrate per serving:   1 slice of bread (1 oz) or 1 six-inch tortilla.    of a hamburger bun or English muffin.  4-6 crackers.   cup unsweetened dry cereal.    cup hot cereal.    cup rice or pasta.    cup mashed potatoes or  of a large baked potato.  1 cup fresh fruit or one small piece of fruit.    cup canned or frozen fruit or fruit juice.  1 cup milk.   cup plain fat-free yogurt or yogurt sweetened with artificial sweeteners.   cup cooked dried beans or starchy vegetable, such as peas, corn, or potatoes.  Decide the number of standard-size servings that you will eat. Multiply that number of servings by 15 (the grams of carbohydrates in that serving). For example, if you eat 2 cups of strawberries, you will have eaten 2 servings and 30 g of carbohydrates (2 servings x 15 g = 30 g). For foods such as soups and casseroles, in which more than one food is mixed in, you will need to count the carbohydrates in each food that is included. EXAMPLE OF CARBOHYDRATE COUNTING Sample Dinner  3 oz chicken breast.   cup of brown rice.   cup of corn.  1 cup milk.   1 cup strawberries with sugar-free whipped topping.  Carbohydrate Calculation Step 1: Identify the foods that contain carbohydrates:   Rice.   Corn.   Milk.   Strawberries. Step 2:Calculate the number of servings eaten of each:   2 servings of rice.   1 serving of corn.   1 serving of milk.   1 serving of strawberries. Step 3: Multiply each of those number of servings by 15 g:   2 servings of rice x 15 g = 30 g.   1 serving of corn x 15 g = 15 g.   1 serving of milk x 15 g = 15 g.   1 serving of strawberries x 15 g = 15 g. Step 4: Add together all of the amounts to find the total grams of carbohydrates eaten: 30 g + 15 g + 15 g + 15 g = 75 g. Document Released: 09/14/2005 Document Revised: 01/29/2014 Document Reviewed: 08/11/2013 [employer] Patient Information 2015 [employer]. This information is not intended to replace advice given to you by your health care provider. Make sure you discuss any questions you have with your health care provider. Diabetes and  Exercise Exercising regularly is important. It is not just about losing weight. It has many health benefits, such as:  Improving your overall fitness, flexibility, and endurance.  Increasing your bone density.  Helping with weight control.  Decreasing your body fat.  Increasing your muscle strength.  Reducing stress and tension.  Improving your overall health. People with diabetes who exercise gain additional benefits because exercise:  Reduces appetite.  Improves the body's use of blood sugar (glucose).  Helps lower or control blood glucose.  Decreases blood pressure.  Helps control blood lipids (such as cholesterol and triglycerides).  Improves the body's use of the hormone insulin by:  Increasing the body's insulin sensitivity.  Reducing the body's insulin needs.  Decreases   the risk for heart disease because exercising:  Lowers cholesterol and triglycerides levels.  Increases the levels of good cholesterol (such as high-density lipoproteins [HDL]) in the body.  Lowers blood glucose levels. YOUR ACTIVITY PLAN  Choose an activity that you enjoy and set realistic goals. Your health care provider or diabetes educator can help you make an activity plan that works for you. Exercise regularly as directed by your health care provider. This includes:  Performing resistance training twice a week such as push-ups, sit-ups, lifting weights, or using resistance bands.  Performing 150 minutes of cardio exercises each week such as walking, running, or playing sports.  Staying active and spending no more than 90 minutes at one time being inactive. Even short bursts of exercise are good for you. Three 10-minute sessions spread throughout the day are just as beneficial as a single 30-minute session. Some exercise ideas include:  Taking the dog for a walk.  Taking the stairs instead of the elevator.  Dancing to your favorite song.  Doing an exercise video.  Doing your  favorite exercise with a friend. RECOMMENDATIONS FOR EXERCISING WITH TYPE 1 OR TYPE 2 DIABETES   Check your blood glucose before exercising. If blood glucose levels are greater than 240 mg/dL, check for urine ketones. Do not exercise if ketones are present.  Avoid injecting insulin into areas of the body that are going to be exercised. For example, avoid injecting insulin into:  The arms when playing tennis.  The legs when jogging.  Keep a record of:  Food intake before and after you exercise.  Expected peak times of insulin action.  Blood glucose levels before and after you exercise.  The type and amount of exercise you have done.  Review your records with your health care provider. Your health care provider will help you to develop guidelines for adjusting food intake and insulin amounts before and after exercising.  If you take insulin or oral hypoglycemic agents, watch for signs and symptoms of hypoglycemia. They include:  Dizziness.  Shaking.  Sweating.  Chills.  Confusion.  Drink plenty of water while you exercise to prevent dehydration or heat stroke. Body water is lost during exercise and must be replaced.  Talk to your health care provider before starting an exercise program to make sure it is safe for you. Remember, almost any type of activity is better than none. Document Released: 12/05/2003 Document Revised: 01/29/2014 Document Reviewed: 02/21/2013 [employer] Patient Information 2015 [employer]. This information is not intended to replace advice given to you by your health care provider. Make sure you discuss any questions you have with your health care provider.  

## 2014-09-10 NOTE — Progress Notes (Signed)
Pt comes in to f/u for urine culture results States she was notified of Diabetes affecting kidneys C/o freq sob with sleep. Requesting referral to sleep study for Cpap settings C/o polyphagia, polydipsia x 1 mnth CBG- 221, pt no compliant with checking CBg Requesting Flu/Pna vaccine  A1c-2 mnths ago 8.1 Denies blurred vision

## 2014-12-03 ENCOUNTER — Telehealth: Payer: Self-pay | Admitting: Internal Medicine

## 2014-12-03 ENCOUNTER — Encounter: Payer: Self-pay | Admitting: Internal Medicine

## 2014-12-03 ENCOUNTER — Ambulatory Visit: Payer: No Typology Code available for payment source | Attending: Internal Medicine | Admitting: Internal Medicine

## 2014-12-03 VITALS — BP 142/84 | HR 73 | Temp 97.7°F | Resp 16

## 2014-12-03 DIAGNOSIS — R358 Other polyuria: Secondary | ICD-10-CM | POA: Insufficient documentation

## 2014-12-03 DIAGNOSIS — E119 Type 2 diabetes mellitus without complications: Secondary | ICD-10-CM | POA: Insufficient documentation

## 2014-12-03 DIAGNOSIS — E785 Hyperlipidemia, unspecified: Secondary | ICD-10-CM | POA: Insufficient documentation

## 2014-12-03 DIAGNOSIS — I10 Essential (primary) hypertension: Secondary | ICD-10-CM | POA: Insufficient documentation

## 2014-12-03 DIAGNOSIS — Z713 Dietary counseling and surveillance: Secondary | ICD-10-CM | POA: Insufficient documentation

## 2014-12-03 DIAGNOSIS — M549 Dorsalgia, unspecified: Secondary | ICD-10-CM | POA: Insufficient documentation

## 2014-12-03 DIAGNOSIS — R631 Polydipsia: Secondary | ICD-10-CM | POA: Insufficient documentation

## 2014-12-03 LAB — COMPLETE METABOLIC PANEL WITH GFR
ALK PHOS: 119 U/L — AB (ref 39–117)
ALT: 11 U/L (ref 0–35)
AST: 23 U/L (ref 0–37)
Albumin: 3.9 g/dL (ref 3.5–5.2)
BUN: 12 mg/dL (ref 6–23)
CO2: 28 meq/L (ref 19–32)
CREATININE: 0.59 mg/dL (ref 0.50–1.10)
Calcium: 9.7 mg/dL (ref 8.4–10.5)
Chloride: 92 mEq/L — ABNORMAL LOW (ref 96–112)
GFR, Est African American: >89 mL/min
GLUCOSE: 255 mg/dL — AB (ref 70–99)
Potassium: 4.6 mEq/L (ref 3.5–5.3)
SODIUM: 131 meq/L — AB (ref 135–145)
Total Bilirubin: 0.4 mg/dL (ref 0.2–1.2)
Total Protein: 7.4 g/dL (ref 6.0–8.3)

## 2014-12-03 LAB — GLUCOSE, POCT (MANUAL RESULT ENTRY)
POC GLUCOSE: 317 mg/dL — AB (ref 70–99)
POC GLUCOSE: 265 mg/dL — AB (ref 70–99)

## 2014-12-03 LAB — POCT GLYCOSYLATED HEMOGLOBIN (HGB A1C): Hemoglobin A1C: 10.9

## 2014-12-03 MED ORDER — AMLODIPINE BESYLATE 10 MG PO TABS: 10.0000 mg | ORAL_TABLET | Freq: Every day | ORAL | Status: DC

## 2014-12-03 MED ORDER — GLIPIZIDE ER 5 MG PO TB24: 5.0000 mg | ORAL_TABLET | Freq: Every day | ORAL | Status: DC

## 2014-12-03 MED ORDER — METFORMIN HCL 1000 MG PO TABS: 1000.0000 mg | ORAL_TABLET | Freq: Two times a day (BID) | ORAL | Status: DC

## 2014-12-03 MED ORDER — INSULIN ASPART 100 UNIT/ML ~~LOC~~ SOLN
10.0000 [IU] | Freq: Once | SUBCUTANEOUS | Status: AC
  Administered 2014-12-03: 10 [IU] via SUBCUTANEOUS

## 2014-12-03 MED ORDER — LOSARTAN POTASSIUM 50 MG PO TABS: 50.0000 mg | ORAL_TABLET | Freq: Every day | ORAL | Status: DC

## 2014-12-03 NOTE — Progress Notes (Signed)
Patient ID: [name], female   DOB: [birth year], 49 y.o.   MRN: [id number] 1. HTN: Medication: fosinopril currently. She would like to switch to lisinopril due to price of current medication. Reports that she had rash and cough with lisinopril but want to try it again. Home BP monitoring: does not check  Negative DJM:[id number], chest pain, SOB, palpitations, edema   2. DM2:  Medication: States that she takes 2 tablets totaling 1,000 mg per day the evening. She admits to eating multiple starches per day Home CBG monitoring: checks blood sugars 140-320 Hypoglycemic event: none Positive ROS polyuria/dipsia,  Negative QIW:[id number], blurred vision  3. HLD: Medication: Reports that Her PCP stopped her Crestor due to back pain  Negative ROS: claudication still having back pain    Social History reviewed: Smoker never Exercise not currently    Physical Exam  Constitutional: She is oriented to person, place, and time.  Cardiovascular: Normal rate, regular rhythm and normal heart sounds.   Pulmonary/Chest: Effort normal and breath sounds normal.  Musculoskeletal: She exhibits no edema.  Neurological: She is alert and oriented to person, place, and time.  Skin: Skin is warm.   [name] was seen today for follow-up, diabetes and hypertension.  Diagnoses and all orders for this visit:  Type 2 diabetes mellitus without complication Orders: -     HgB A1c -     Glucose (CBG) -     insulin aspart (novoLOG) injection 10 Units; Inject 0.1 mLs (10 Units total) into the skin once. -     metFORMIN (GLUCOPHAGE) 1000 MG tablet; Take 1 tablet (1,000 mg total) by mouth 2 (two) times daily with a meal. -     Begin glipiZIDE (GLUCOTROL XL) 5 MG 24 hr tablet; Take 1 tablet (5 mg total) by mouth daily with breakfast. -     POCT glucose (manual entry) Patients diabetes remains uncontrolled as evidence by hemoglobin a1c >8.  Patient has been not been taking Metformin as prescribed. Stressed the multiple  complications associated with uncontrolled diabetes. I have educated patient on proper way to take medication and she will report back to clinic with cbg log in 2 weeks.  Essential hypertension Orders: -     amLODipine (NORVASC) 10 MG tablet; Take 1 tablet (10 mg total) by mouth daily. -     losartan (COZAAR) 50 MG tablet; Take 1 tablet (50 mg total) by mouth daily. -     COMPLETE METABOLIC PANEL WITH GFR I have taken patient off fosinopril due to cost and switched her to Losartan. She will take med for 2 weeks and RTC for a BP recheck. If she is not controlled at that time she may require a dose increase of Losartan.   Morbid obesity Stressed specific diet changes with patient and exercise to prevent further complications of chronic disease. Goal patient will lose at least 10 pounds by next visit with Dr. Doreene Burke.   >50% of time was spent counseling patient.  Return in about 2 weeks (around 12/17/2014) for Nurse Visit-BP/CBg and Lab visit,  3 mo PCP.  Chari Manning, NP 12/04/2014 2:18 PM

## 2014-12-03 NOTE — Progress Notes (Signed)
Pt comes in today for medication change Fosinopril to Lisinopril per preference States she had rash with taking Lisinopril but unrelated to medication  CBg- 317, states she didn't take medication this am A1c-obtained

## 2014-12-03 NOTE — Telephone Encounter (Signed)
Pt placed on Valerie schedule

## 2014-12-03 NOTE — Patient Instructions (Signed)
Stop fosinopril and begin Losartan for BP  Please take 1 tablet of 1,000 mg of Metformin TWICW per day Also begin taking Glucotrol 5 mg daily for high blood sugars as well.   Return in 2 weeks for a BP recheck and blood sugar log review with the nurse. Please change your diet and exercise to lose weight.   You will follow up with Dr.Jegede for all future care.

## 2014-12-03 NOTE — Telephone Encounter (Signed)
Patient has come in today to request a medication refill change from fosinopril (MONOPRIL) 40 MG tablet to lisinopril; patient is also requesting a medication refill for risperidone (RISPERDAL) 4 MG tablet but she is requesting to have 2mg  tablets instead of 4mg  tablets; patient is looking to schedule an appointment for refills as well as for back pain; please f/u with patient about how to schedule and appointment as her provider will be out of the office until 03/21;

## 2015-01-02 ENCOUNTER — Ambulatory Visit: Payer: No Typology Code available for payment source | Attending: Family Medicine | Admitting: Family Medicine

## 2015-01-02 ENCOUNTER — Encounter: Payer: Self-pay | Admitting: Family Medicine

## 2015-01-02 VITALS — BP 163/95 | HR 81 | Temp 98.0°F | Resp 15 | Wt 342.6 lb

## 2015-01-02 DIAGNOSIS — Z794 Long term (current) use of insulin: Secondary | ICD-10-CM | POA: Insufficient documentation

## 2015-01-02 DIAGNOSIS — E669 Obesity, unspecified: Secondary | ICD-10-CM | POA: Insufficient documentation

## 2015-01-02 DIAGNOSIS — K5289 Other specified noninfective gastroenteritis and colitis: Secondary | ICD-10-CM

## 2015-01-02 DIAGNOSIS — I10 Essential (primary) hypertension: Secondary | ICD-10-CM

## 2015-01-02 DIAGNOSIS — J329 Chronic sinusitis, unspecified: Secondary | ICD-10-CM | POA: Insufficient documentation

## 2015-01-02 DIAGNOSIS — F319 Bipolar disorder, unspecified: Secondary | ICD-10-CM | POA: Insufficient documentation

## 2015-01-02 DIAGNOSIS — J320 Chronic maxillary sinusitis: Secondary | ICD-10-CM

## 2015-01-02 DIAGNOSIS — K529 Noninfective gastroenteritis and colitis, unspecified: Secondary | ICD-10-CM | POA: Insufficient documentation

## 2015-01-02 DIAGNOSIS — I5032 Chronic diastolic (congestive) heart failure: Secondary | ICD-10-CM | POA: Insufficient documentation

## 2015-01-02 DIAGNOSIS — E119 Type 2 diabetes mellitus without complications: Secondary | ICD-10-CM | POA: Insufficient documentation

## 2015-01-02 DIAGNOSIS — I119 Hypertensive heart disease without heart failure: Secondary | ICD-10-CM | POA: Insufficient documentation

## 2015-01-02 HISTORY — DX: Chronic sinusitis, unspecified: J32.9

## 2015-01-02 MED ORDER — LOSARTAN POTASSIUM 100 MG PO TABS: 100.0000 mg | ORAL_TABLET | Freq: Every day | ORAL | Status: DC

## 2015-01-02 MED ORDER — FLUTICASONE PROPIONATE 50 MCG/ACT NA SUSP: 2.0000 | Freq: Every day | NASAL | Status: DC

## 2015-01-02 NOTE — Progress Notes (Signed)
Patient complains of loose stools and diarrhea that  Started yesterday Complains of nasal congestion, SOB,very fatigued and itchy eyes Patient denies any vomiting

## 2015-01-02 NOTE — Progress Notes (Signed)
Subjective:    Patient ID: [name], female    DOB: [birth year], 49 y.o.   MRN: [id number]  HPI  [name] presents for an acute visit and complains of diarrhea which has been watery and started yesterday, denies blood in stool. Has had some abdominal cramping; no h/o sick contacts or recent antibiotic use. Her appetite is normal. No vomiting, no nausea.or fever Has not used any OTC meds  Also c/o nasal congestion and itchy eyes x1 week; used OTC antihistamines with some relief  Past Medical History  Diagnosis Date  . Insulin dependent type 2 diabetes mellitus, uncontrolled   . Chronic diastolic CHF (congestive heart failure)   . Morbid obesity with BMI of 50.0-59.9, adult   . Bipolar disease, chronic   . History of thyrotoxicosis   . OSA (obstructive sleep apnea) 03/06/2011  . Chest pain     a. 2012 Myoview: EF 63%, no isch/infarct  . Chronic diastolic congestive heart failure 07/23/2011  . Hypertensive heart disease 10/18/2013     History reviewed. No pertinent past surgical history.   History   Social History  . Marital Status: Single    Spouse Name: N/A  . Number of Children: 0  . Years of Education: 18   Occupational History  . unemployed    Social History Main Topics  . Smoking status: Never Smoker   . Smokeless tobacco: Never Used  . Alcohol Use: No  . Drug Use: No  . Sexual Activity: Not Currently    Birth Control/ Protection: None   Other Topics Concern  . 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 But ran out of money and is no longer attending school. (Note patient has bipolar disorder).      Born in [place] but lived in [place] before coming back to [place].       Primary language is Arabic. Lives with mother and brother.                Allergies  Allergen Reactions  . Acetaminophen     Fits as a child "seizures-like"  .  Caffeine     Tense, anxiety, increased urination  . Lisinopril     Rash with lisinopril; but fosinopril is ok per patient     Current Outpatient Prescriptions on File Prior to Visit  Medication Sig Dispense Refill  . amLODipine (NORVASC) 10 MG tablet Take 1 tablet (10 mg total) by mouth daily. 90 tablet 3  . furosemide (LASIX) 80 MG tablet Take 1 tablet (80 mg total) by mouth daily. 90 tablet 3  . glipiZIDE (GLUCOTROL XL) 5 MG 24 hr tablet Take 1 tablet (5 mg total) by mouth daily with breakfast. 30 tablet 3  . LORazepam (ATIVAN) 1 MG tablet Take 1 tablet (1 mg total) by mouth at bedtime. (Patient not taking: Reported on 09/10/2014) 30 tablet 0  . metFORMIN (GLUCOPHAGE) 1000 MG tablet Take 1 tablet (1,000 mg total) by mouth 2 (two) times daily with a meal. 60 tablet 3  . metoprolol succinate (TOPROL-XL) 50 MG 24 hr tablet Take 1 tablet (50 mg total) by mouth daily. Take with or immediately following a meal. 90 tablet 3  . potassium chloride (K-DUR,KLOR-CON) 10 MEQ tablet Take 1 tablet (10 mEq total) by mouth 2 (two) times daily. 180 tablet 3  . risperidone (RISPERDAL) 4 MG  tablet Take 1 tablet (4 mg total) by mouth daily. 30 tablet 3  . rosuvastatin (CRESTOR) 10 MG tablet Take 1 tablet (10 mg total) by mouth daily. (Patient not taking: Reported on 09/10/2014) 90 tablet 3  . traMADol (ULTRAM) 50 MG tablet Take 1 tablet (50 mg total) by mouth every 12 (twelve) hours as needed. 60 tablet 0   No current facility-administered medications on file prior to visit.      Review of Systems  General: negative for fever, weight loss, appetite change Eyes: no visual symptoms. ENT: see HPI Neck: no pain  Respiratory: no wheezing, shortness of breath, cough Cardiovascular: no chest pain, dyspnea on exertion which is at baseline, no pedal edema, no orthopnea. Gastrointestinal:  See HPI Genito-Urinary: no urinary frequency, no dysuria, no polyuria. Hematologic: no bruising Endocrine: no cold or heat  intolerance Neurological: no headaches, no seizures, no tremors Musculoskeletal: no joint pains, no joint swelling         Objective:   Physical Exam  Constitutional: obese, speech is nasal Eyes: PERRLA HENT: Head is atraumatic, normal sinuses, normal oropharynx, normal appearing tonsils and palate Neck: normal range of motion, no thyromegaly, no JVD cardiovascular: normal rate and rhythm, normal heart sounds, no murmurs, rub or gallop, no pedal edema Respiratory: clear to auscultation bilaterally, no wheezes, no rales, no rhonchi Abdomen: soft, not tender to palpation, normal bowel sounds, no enlarged organs Extremities: Full ROM, no tenderness in joints Skin: warm and dry, no lesions. Neurological: alert, oriented x3, cranial nerves I-XII grossly intact Psychological: normal mood.  Lab Results  Component Value Date   HGBA1C 10.90 12/03/2014           Assessment & Plan:  49 year old female patient with multiple comorbidities presenting with diarrhea and sinus symptoms.  Gastroenteritis: Supportive therapy advised, increase oral fluids, BRAT diet advised.  Hypertension: Uncontrolled and so I have increased the dose of losartan and she would return at the next office visit for a reassessment of her blood pressure.  Chronic sinusitis: Continue antihistamines as she reports effectiveness and I will add on Flonase to her regimen. She knows to report to the clinic if symptoms persist as she might need to be covered with antibiotics given her risk factors.

## 2015-01-02 NOTE — Patient Instructions (Signed)

## 2015-03-04 ENCOUNTER — Other Ambulatory Visit: Payer: Self-pay | Admitting: Internal Medicine

## 2015-03-04 ENCOUNTER — Other Ambulatory Visit: Payer: Self-pay | Admitting: Pharmacist

## 2015-03-04 DIAGNOSIS — IMO0001 Reserved for inherently not codable concepts without codable children: Secondary | ICD-10-CM

## 2015-03-04 DIAGNOSIS — E119 Type 2 diabetes mellitus without complications: Principal | ICD-10-CM

## 2015-03-04 DIAGNOSIS — Z794 Long term (current) use of insulin: Principal | ICD-10-CM

## 2015-03-04 MED ORDER — GLUCOSE BLOOD VI STRP: ORAL_STRIP | Status: DC

## 2015-03-04 MED ORDER — TRUE METRIX METER W/DEVICE KIT: PACK | Status: DC

## 2015-03-07 ENCOUNTER — Encounter: Payer: Self-pay | Admitting: Internal Medicine

## 2015-03-07 ENCOUNTER — Other Ambulatory Visit: Payer: Self-pay

## 2015-03-07 ENCOUNTER — Ambulatory Visit: Payer: Self-pay | Attending: Internal Medicine | Admitting: Internal Medicine

## 2015-03-07 VITALS — BP 152/85 | HR 85 | Temp 98.0°F | Resp 18 | Ht 67.0 in | Wt 362.4 lb

## 2015-03-07 DIAGNOSIS — E1165 Type 2 diabetes mellitus with hyperglycemia: Secondary | ICD-10-CM

## 2015-03-07 DIAGNOSIS — I10 Essential (primary) hypertension: Secondary | ICD-10-CM

## 2015-03-07 DIAGNOSIS — G4733 Obstructive sleep apnea (adult) (pediatric): Secondary | ICD-10-CM

## 2015-03-07 DIAGNOSIS — F32A Depression, unspecified: Secondary | ICD-10-CM | POA: Insufficient documentation

## 2015-03-07 DIAGNOSIS — F329 Major depressive disorder, single episode, unspecified: Secondary | ICD-10-CM

## 2015-03-07 LAB — POCT GLYCOSYLATED HEMOGLOBIN (HGB A1C): Hemoglobin A1C: 10.6

## 2015-03-07 LAB — GLUCOSE, POCT (MANUAL RESULT ENTRY): POC Glucose: 213 mg/dl — AB (ref 70–99)

## 2015-03-07 MED ORDER — MIRTAZAPINE 30 MG PO TABS: 30.0000 mg | ORAL_TABLET | Freq: Every day | ORAL | Status: DC

## 2015-03-07 NOTE — Progress Notes (Signed)
Patient here with complaints of SOB and chest pain "on and off".  Patient also complains of stabbing/sharp pain in back 10/10 and insomnia.  Patient also complains of swelling in legs. Chest pain 7/10 described as pressure and stabbing "like something heavy".  Patient also complains of diarrhea for a long time and cramping in stomach.  Patient current CBG 213.

## 2015-03-07 NOTE — Patient Instructions (Signed)
Diabetes and Exercise Exercising regularly is important. It is not just about losing weight. It has many health benefits, such as:  Improving your overall fitness, flexibility, and endurance.  Increasing your bone density.  Helping with weight control.  Decreasing your body fat.  Increasing your muscle strength.  Reducing stress and tension.  Improving your overall health. People with diabetes who exercise gain additional benefits because exercise:  Reduces appetite.  Improves the body's use of blood sugar (glucose).  Helps lower or control blood glucose.  Decreases blood pressure.  Helps control blood lipids (such as cholesterol and triglycerides).  Improves the body's use of the hormone insulin by:  Increasing the body's insulin sensitivity.  Reducing the body's insulin needs.  Decreases the risk for heart disease because exercising:  Lowers cholesterol and triglycerides levels.  Increases the levels of good cholesterol (such as high-density lipoproteins [HDL]) in the body.  Lowers blood glucose levels. YOUR ACTIVITY PLAN  Choose an activity that you enjoy and set realistic goals. Your health care provider or diabetes educator can help you make an activity plan that works for you. Exercise regularly as directed by your health care provider. This includes:  Performing resistance training twice a week such as push-ups, sit-ups, lifting weights, or using resistance bands.  Performing 150 minutes of cardio exercises each week such as walking, running, or playing sports.  Staying active and spending no more than 90 minutes at one time being inactive. Even short bursts of exercise are good for you. Three 10-minute sessions spread throughout the day are just as beneficial as a single 30-minute session. Some exercise ideas include:  Taking the dog for a walk.  Taking the stairs instead of the elevator.  Dancing to your favorite song.  Doing an exercise  video.  Doing your favorite exercise with a friend. RECOMMENDATIONS FOR EXERCISING WITH TYPE 1 OR TYPE 2 DIABETES   Check your blood glucose before exercising. If blood glucose levels are greater than 240 mg/dL, check for urine ketones. Do not exercise if ketones are present.  Avoid injecting insulin into areas of the body that are going to be exercised. For example, avoid injecting insulin into:  The arms when playing tennis.  The legs when jogging.  Keep a record of:  Food intake before and after you exercise.  Expected peak times of insulin action.  Blood glucose levels before and after you exercise.  The type and amount of exercise you have done.  Review your records with your health care provider. Your health care provider will help you to develop guidelines for adjusting food intake and insulin amounts before and after exercising.  If you take insulin or oral hypoglycemic agents, watch for signs and symptoms of hypoglycemia. They include:  Dizziness.  Shaking.  Sweating.  Chills.  Confusion.  Drink plenty of water while you exercise to prevent dehydration or heat stroke. Body water is lost during exercise and must be replaced.  Talk to your health care provider before starting an exercise program to make sure it is safe for you. Remember, almost any type of activity is better than none. Document Released: 12/05/2003 Document Revised: 01/29/2014 Document Reviewed: 02/21/2013 ExitCare Patient Information 2015 [employer]. This information is not intended to replace advice given to you by your health care provider. Make sure you discuss any questions you have with your health care provider.  

## 2015-03-07 NOTE — Progress Notes (Signed)
Patient ID: [name], female   DOB: [birth year], 49 y.o.   MRN: [id number]   [name], is a 49 y.o. female  TOI:[id number]  IPJ:[id number]  DOB - Mar 31, 1966  Chief Complaint  Patient presents with  . Follow-up  . Chest Pain        Subjective:   [name] is a 49 y.o. female here today for a follow up visit. Patient has history of hypertension, diabetes mellitus, morbid obesity, chronic diastolic congestive heart failure, bipolar disorder, extreme noncompliance with medication and nutrition, here today for follow-up. She complains of severe depression, she feels worthless with low self-esteem as a result of "no job, no husband, not productive, no profession. 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 She sits at home all day eating mostly non-healthy diet. She denies suicidal ideation or thoughts. She is supposed to be on sleep machine but for about one year she has not used it. She continues to gain weight and lately has been having difficulty breathing with little activity. She is currently not on anti-depressing. She does not take her medications for diabetes or hypertension or heart failure. Her hemoglobin A1c continues to climb. Patient has No headache, No chest pain, No abdominal pain - No Nausea, No new weakness tingling or numbness. No report of 6 malnutrition and dyspnea, no cough, no leg swelling.  Problem  Essential Hypertension  Depression (Emotion)    ALLERGIES: Allergies  Allergen Reactions  . Acetaminophen     Fits as a child "seizures-like"  . Caffeine     Tense, anxiety, increased urination  . Lisinopril     Rash with lisinopril; but fosinopril is ok per patient    PAST MEDICAL HISTORY: Past Medical History  Diagnosis Date  . Insulin dependent type 2 diabetes mellitus, uncontrolled   . Chronic diastolic CHF (congestive heart failure)   . Morbid obesity with BMI of 50.0-59.9, adult   . Bipolar disease, chronic   . History of thyrotoxicosis   .  OSA (obstructive sleep apnea) 03/06/2011  . Chest pain     a. 2012 Myoview: EF 63%, no isch/infarct  . Chronic diastolic congestive heart failure 07/23/2011  . Hypertensive heart disease 10/18/2013    MEDICATIONS AT HOME: Prior to Admission medications   Medication Sig Start Date End Date Taking? Authorizing Provider  amLODipine (NORVASC) 10 MG tablet Take 1 tablet (10 mg total) by mouth daily. 12/03/14  Yes Lance Bosch, NP  Blood Glucose Monitoring Suppl (TRUE METRIX METER) W/DEVICE KIT uad 03/04/15  Yes Tresa Garter, MD  furosemide (LASIX) 80 MG tablet Take 1 tablet (80 mg total) by mouth daily. 09/10/14  Yes Tresa Garter, MD  glipiZIDE (GLUCOTROL XL) 5 MG 24 hr tablet Take 1 tablet (5 mg total) by mouth daily with breakfast. 12/03/14  Yes Lance Bosch, NP  glucose blood (TRUE METRIX BLOOD GLUCOSE TEST) test strip Use as instructed 03/04/15  Yes Klyn Kroening E Doreene Burke, MD  LORazepam (ATIVAN) 1 MG tablet Take 1 tablet (1 mg total) by mouth at bedtime. 07/02/14  Yes Tresa Garter, MD  losartan (COZAAR) 100 MG tablet Take 1 tablet (100 mg total) by mouth daily. 01/02/15  Yes Arnoldo Morale, MD  metFORMIN (GLUCOPHAGE) 1000 MG tablet Take 1 tablet (1,000 mg total) by mouth 2 (two) times daily with a meal. 12/03/14  Yes Lance Bosch, NP  potassium chloride (K-DUR,KLOR-CON) 10 MEQ tablet Take 1 tablet (10 mEq total) by mouth 2 (two) times daily. 09/10/14  Yes Tresa Garter, MD  risperidone (RISPERDAL) 4 MG tablet Take 1 tablet (4 mg total) by mouth daily. 09/10/14  Yes Tresa Garter, MD  fluticasone (FLONASE) 50 MCG/ACT nasal spray Place 2 sprays into both nostrils daily. Patient not taking: Reported on 03/07/2015 01/02/15   Arnoldo Morale, MD  metoprolol succinate (TOPROL-XL) 50 MG 24 hr tablet Take 1 tablet (50 mg total) by mouth daily. Take with or immediately following a meal. 11/28/13   Tresa Garter, MD  mirtazapine (REMERON) 30 MG tablet Take 1 tablet (30 mg total) by mouth  at bedtime. 03/07/15   Tresa Garter, MD  rosuvastatin (CRESTOR) 10 MG tablet Take 1 tablet (10 mg total) by mouth daily. Patient not taking: Reported on 09/10/2014 06/01/13   Ripudeep Krystal Eaton, MD  traMADol (ULTRAM) 50 MG tablet Take 1 tablet (50 mg total) by mouth every 12 (twelve) hours as needed. Patient not taking: Reported on 03/07/2015 09/10/14   Tresa Garter, MD     Objective:   Filed Vitals:   03/07/15 1708 03/07/15 1714  BP:  152/85  Pulse:  85  Temp:  98 F (36.7 C)  TempSrc:  Oral  Resp:  18  Height: '5\' 7"'  (1.702 m)   Weight: 362 lb 6.4 oz (164.384 kg)   SpO2:  95%    Exam General appearance : Awake, alert, not in any distress. Speech Clear. Not toxic looking, morbidly obese HEENT: Atraumatic and Normocephalic, pupils equally reactive to light and accomodation Neck: supple, no JVD. No cervical lymphadenopathy.  Chest:Good air entry bilaterally, no added sounds  CVS: S1 S2 regular, no murmurs.  Abdomen: Bowel sounds present, Non tender and not distended with no gaurding, rigidity or rebound. Extremities: B/L Lower Ext shows no edema, both legs are warm to touch Neurology: Awake alert, and oriented X 3, CN II-XII intact, Non focal Skin:No Rash  Data Review Lab Results  Component Value Date   HGBA1C 10.90 12/03/2014   HGBA1C 8.1 07/02/2014   HGBA1C 7.7* 11/03/2013     Assessment & Plan   EKG done in the clinic today revealed normal sinus rhythm. No significant ST-T wave changes, there is mild right axis deviation possibly from pulmonary hypertension as a result of severe obstructive sleep apnea, patient has been on CPAP machine for years but she has refused to use it in the last 1-2 years.  1. Type 2 diabetes mellitus with hyperglycemia  - Glucose (CBG) - Basic Metabolic Panel   Aim for 2-3 Carb Choices per meal (30-45 grams) +/- 1 either way  Aim for 0-15 Carbs per snack if hungry  Include protein in moderation with your meals and snacks  Consider  reading food labels for Total Carbohydrate and Fat Grams of foods  Consider checking BG at alternate times per day  Continue taking medication as directed Fruit Punch - find one with no sugar  Measure and decrease portions of carbohydrate foods  Make your plate and don't go back for seconds   2. OSA (obstructive sleep apnea): SPO2 95% on room air  - Split night study; Future  3. Essential hypertension  - We have discussed target BP range and blood pressure goal - I have advised patient to check BP regularly and to call us back or report to clinic if the numbers are consistently higher than 140/90  - We discussed the importance of compliance with medical therapy and DASH diet recommended, consequences of uncontrolled hypertension discussed.  - continue current BP  medications  4. Depression (emotion) Start - mirtazapine (REMERON) 30 MG tablet; Take 1 tablet (30 mg total) by mouth at bedtime.  Dispense: 30 tablet; Refill: 3 - Ambulatory referral to psychiatrist and social worker  Patient have been counseled extensively about nutrition and exercise  Return in about 3 months (around 06/07/2015) for Hemoglobin A1C and Follow up, DM, Follow up HTN, Follow up Pain and comorbidities.  The patient was given clear instructions to go to ER or return to medical center if symptoms don't improve, worsen or new problems develop. The patient verbalized understanding. The patient was told to call to get lab results if they haven't heard anything in the next week.   This note has been created with Surveyor, quantity. Any transcriptional errors are unintentional.    [name], MD, [place], [place], [place], [place] and [place], [place]   03/07/2015, 6:05 PM

## 2015-03-08 LAB — BASIC METABOLIC PANEL
BUN: 15 mg/dL (ref 6–23)
CO2: 27 meq/L (ref 19–32)
Calcium: 8.9 mg/dL (ref 8.4–10.5)
Chloride: 100 mEq/L (ref 96–112)
Creat: 0.69 mg/dL (ref 0.50–1.10)
Glucose, Bld: 211 mg/dL — ABNORMAL HIGH (ref 70–99)
POTASSIUM: 4.5 meq/L (ref 3.5–5.3)
Sodium: 138 mEq/L (ref 135–145)

## 2015-03-12 ENCOUNTER — Telehealth: Payer: Self-pay

## 2015-03-12 IMAGING — CR DG CHEST 2V
2 series · 2 of 2 positions shown · non-contrast
Comparison: 07/22/2012and 06/14/2012

CLINICAL DATA: Shortness of breath.  Diabetes and hypertension peri

CHEST - 2 VIEW

[w chest pa]
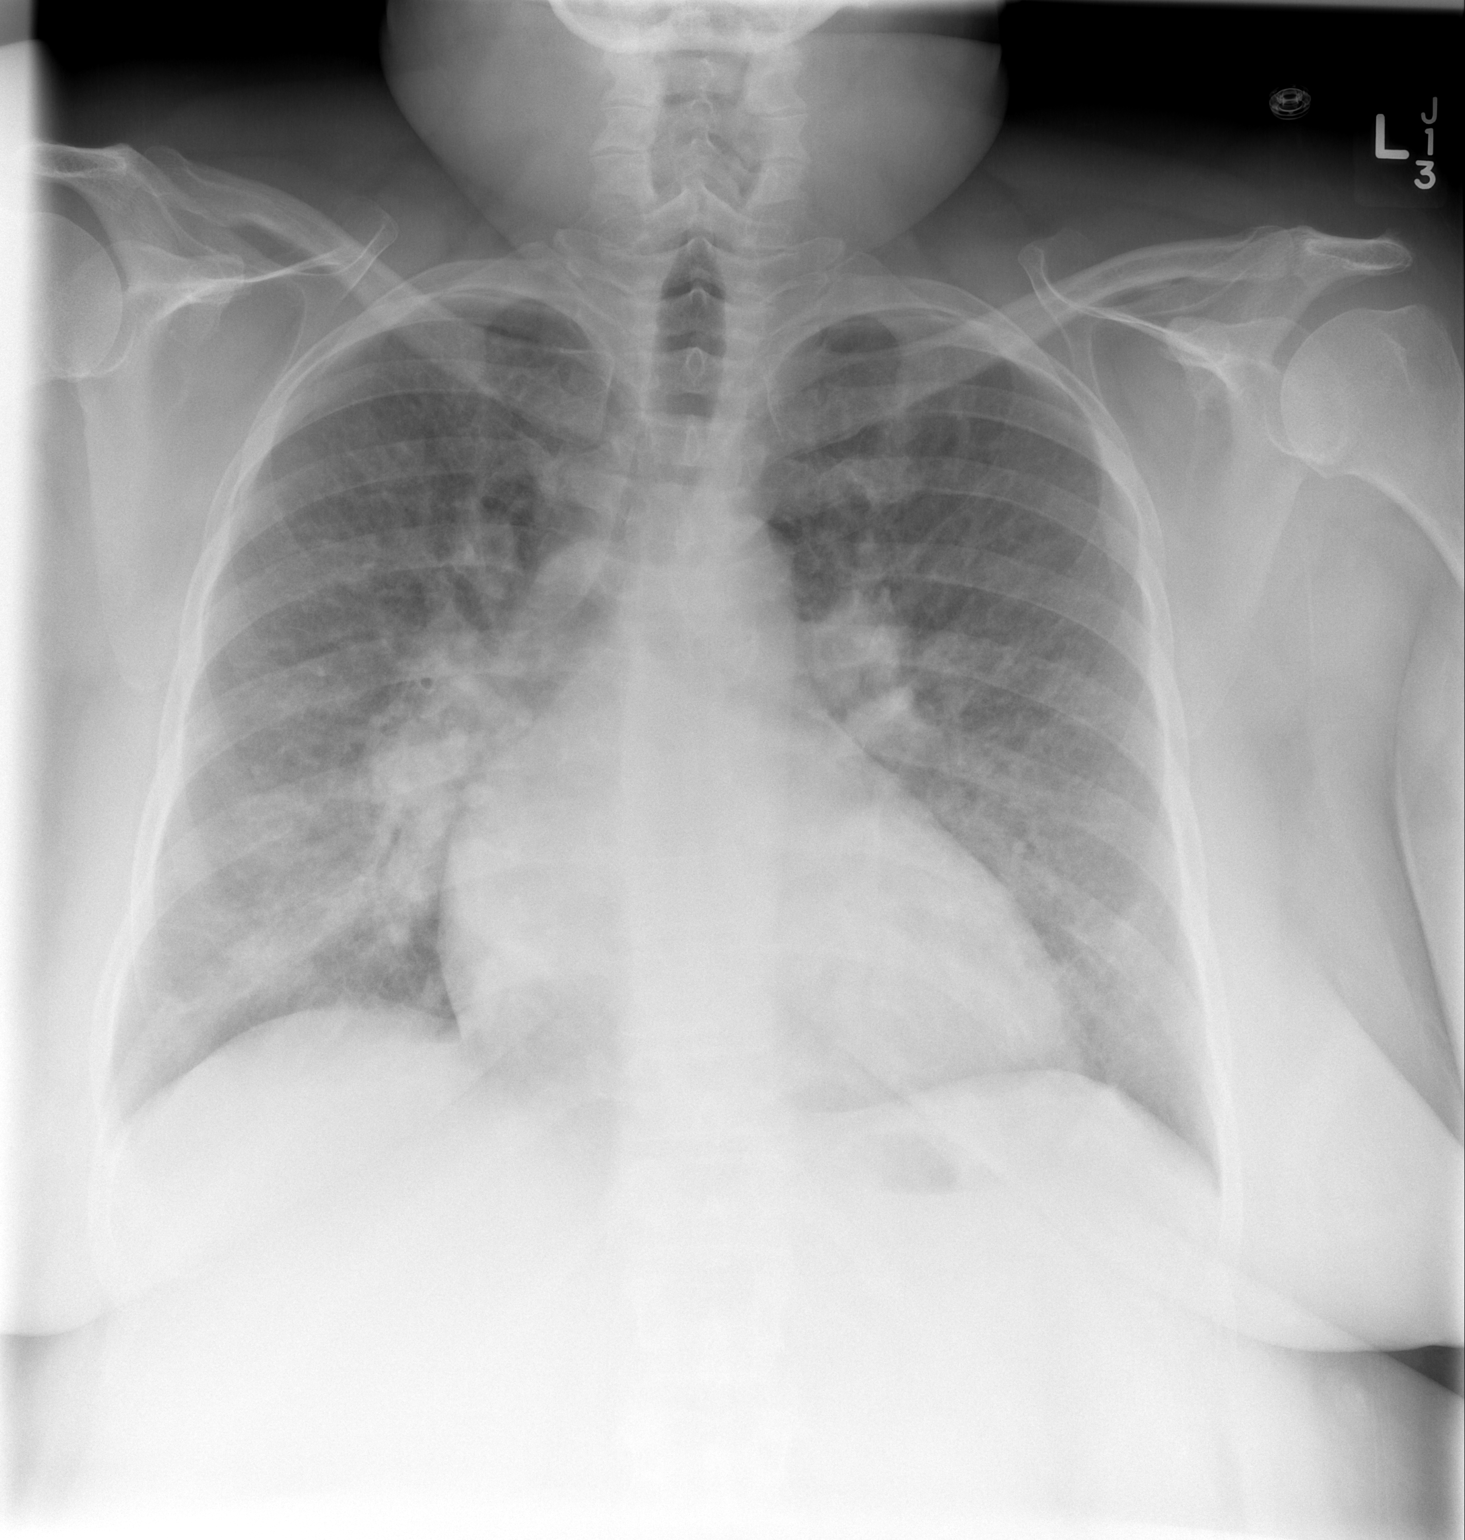

[w chest lat]
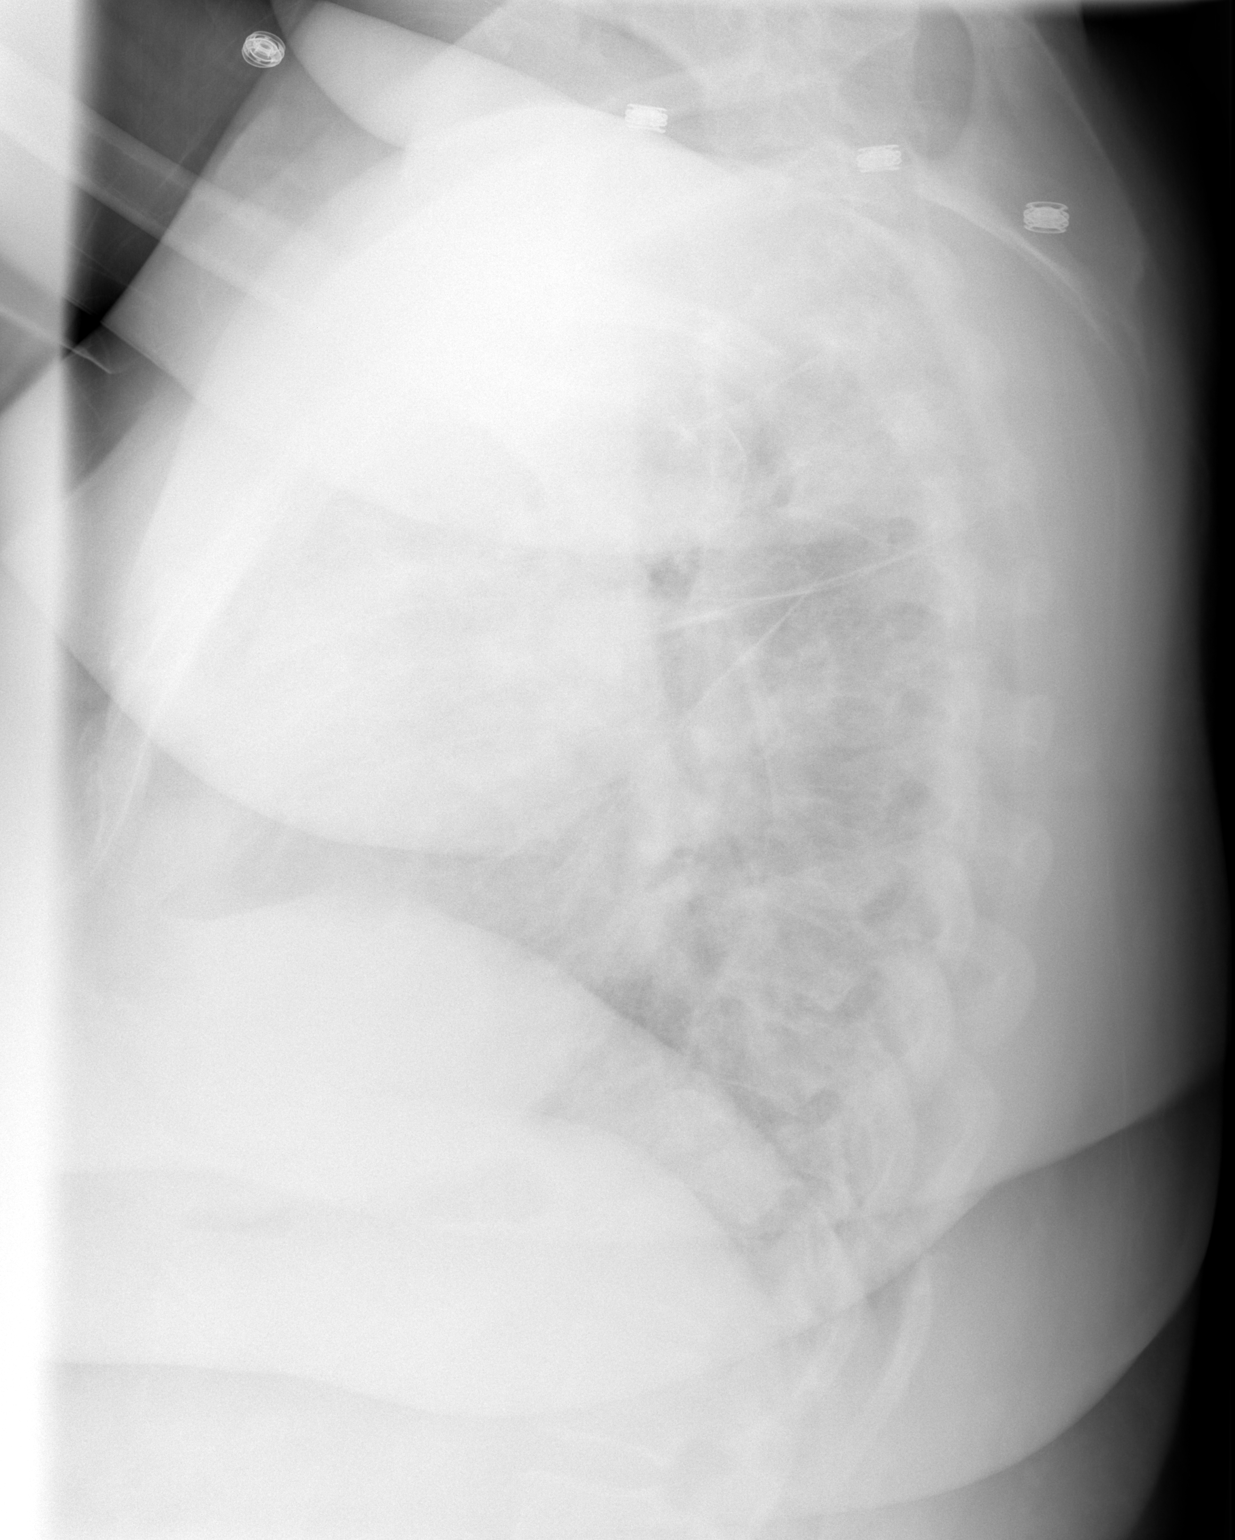

[2 of 2 positions shown; findings below may reference images not displayed]

FINDINGS: Mild cardiomegaly stable.  Pulmonary vascular congestion
and diffuse interstitial prominence is also stable.  No evidence of
acute infiltrate or pleural effusion.  No mass or lymphadenopathy
identified.
IMPRESSION: Stable mild cardiomegaly, pulmonary vascular congestion, and
diffuse interstitial prominence.  No acute findings.

## 2015-03-12 NOTE — Telephone Encounter (Signed)
-----   Message from Tresa Garter, MD sent at 03/12/2015 12:48 PM EDT ----- Please inform patient that her basic metabolic panel is normal. Blood sugar is still very high with increasing hemoglobin A1c, advised patient to adhere strictly with vocational control and to be compliant with medications as prescribed.  Aim for 2-3 Carb Choices per meal (30-45 grams) +/- 1 either way  Aim for 0-15 Carbs per snack if hungry  Include protein in moderation with your meals and snacks  Consider reading food labels for Total Carbohydrate and Fat Grams of foods  Consider checking BG at alternate times per day  Continue taking medication as directed Fruit Punch - find one with no sugar  Measure and decrease portions of carbohydrate foods  Make your plate and don't go back for seconds

## 2015-03-12 NOTE — Telephone Encounter (Signed)
Nurse called patient, reached voicemail at first contact number, Left message for patient to call Symphani Eckstrom at [phone]. Second contact number is invalid.

## 2015-03-13 NOTE — Telephone Encounter (Signed)
Nurse called patient, reached voicemail. Left message for patient to call Shai Rasmussen at [phone].   

## 2015-03-13 NOTE — Telephone Encounter (Signed)
-----   Message from Tresa Garter, MD sent at 03/12/2015 12:48 PM EDT ----- Please inform patient that her basic metabolic panel is normal. Blood sugar is still very high with increasing hemoglobin A1c, advised patient to adhere strictly with vocational control and to be compliant with medications as prescribed.  Aim for 2-3 Carb Choices per meal (30-45 grams) +/- 1 either way  Aim for 0-15 Carbs per snack if hungry  Include protein in moderation with your meals and snacks  Consider reading food labels for Total Carbohydrate and Fat Grams of foods  Consider checking BG at alternate times per day  Continue taking medication as directed Fruit Punch - find one with no sugar  Measure and decrease portions of carbohydrate foods  Make your plate and don't go back for seconds

## 2015-03-13 NOTE — Telephone Encounter (Signed)
Nurse called patient, reached voicemail. Left message for patient to call Deverick Pruss at [phone].   

## 2015-03-13 NOTE — Telephone Encounter (Signed)
Pt returning nurse's call. Please f/u with pt.  °

## 2015-03-13 NOTE — Telephone Encounter (Signed)
Nurse called patient, patient verified date of birth. Patient aware of normal basic metabolic panel. Patient aware of blood sugar still very high. Patient aware of increased A1C. Patient aware of adhering strictly with vocational control and to be compliant with medications as prescribed. Patient aware of dietary guidelines listed in Dr. Christa See note. Patient voices understanding and has no questions at this time.

## 2015-04-25 ENCOUNTER — Other Ambulatory Visit: Payer: Self-pay

## 2015-04-25 ENCOUNTER — Other Ambulatory Visit: Payer: Self-pay | Admitting: Family Medicine

## 2015-04-25 ENCOUNTER — Ambulatory Visit: Payer: Self-pay | Attending: Internal Medicine

## 2015-04-25 VITALS — BP 132/86 | HR 86 | Temp 97.9°F | Resp 20

## 2015-04-25 DIAGNOSIS — E119 Type 2 diabetes mellitus without complications: Secondary | ICD-10-CM

## 2015-04-25 DIAGNOSIS — I10 Essential (primary) hypertension: Secondary | ICD-10-CM

## 2015-04-25 DIAGNOSIS — Z794 Long term (current) use of insulin: Secondary | ICD-10-CM

## 2015-04-25 DIAGNOSIS — IMO0001 Reserved for inherently not codable concepts without codable children: Secondary | ICD-10-CM

## 2015-04-25 LAB — GLUCOSE, POCT (MANUAL RESULT ENTRY)
POC GLUCOSE: 378 mg/dL — AB (ref 70–99)
POC Glucose: 303 mg/dl — AB (ref 70–99)

## 2015-04-25 MED ORDER — LOSARTAN POTASSIUM 100 MG PO TABS: 100.0000 mg | ORAL_TABLET | Freq: Every day | ORAL | Status: DC

## 2015-04-25 MED ORDER — INSULIN ASPART 100 UNIT/ML ~~LOC~~ SOLN
20.0000 [IU] | Freq: Once | SUBCUTANEOUS | Status: AC
  Administered 2015-04-25: 20 [IU] via SUBCUTANEOUS

## 2015-04-25 NOTE — Progress Notes (Signed)
Patient complaining of left sided chest pain at level 7, described as stabbing. This pain has been going on for 3 weeks. This pain comes and goes. EKG completed. Reviewed by Dr. Doreene Burke. CBG checked, insulin given. CBG rechecked. Patient to see Dr. Doreene Burke in 1 week.

## 2015-04-30 ENCOUNTER — Telehealth: Payer: Self-pay | Admitting: Clinical

## 2015-04-30 NOTE — Telephone Encounter (Signed)
Attempt to f/u w pt; person who answered said "wrong number", no message left

## 2015-05-02 ENCOUNTER — Ambulatory Visit: Payer: Self-pay | Attending: Internal Medicine | Admitting: Internal Medicine

## 2015-05-02 ENCOUNTER — Encounter: Payer: Self-pay | Admitting: Internal Medicine

## 2015-05-02 VITALS — BP 146/83 | HR 99 | Temp 98.2°F | Resp 18 | Wt 363.0 lb

## 2015-05-02 DIAGNOSIS — F32A Depression, unspecified: Secondary | ICD-10-CM

## 2015-05-02 DIAGNOSIS — F329 Major depressive disorder, single episode, unspecified: Secondary | ICD-10-CM

## 2015-05-02 DIAGNOSIS — E119 Type 2 diabetes mellitus without complications: Secondary | ICD-10-CM

## 2015-05-02 DIAGNOSIS — I10 Essential (primary) hypertension: Secondary | ICD-10-CM

## 2015-05-02 LAB — GLUCOSE, POCT (MANUAL RESULT ENTRY): POC Glucose: 335 mg/dl — AB (ref 70–99)

## 2015-05-02 NOTE — Patient Instructions (Signed)
Diabetes and Exercise Exercising regularly is important. It is not just about losing weight. It has many health benefits, such as:  Improving your overall fitness, flexibility, and endurance.  Increasing your bone density.  Helping with weight control.  Decreasing your body fat.  Increasing your muscle strength.  Reducing stress and tension.  Improving your overall health. People with diabetes who exercise gain additional benefits because exercise:  Reduces appetite.  Improves the body's use of blood sugar (glucose).  Helps lower or control blood glucose.  Decreases blood pressure.  Helps control blood lipids (such as cholesterol and triglycerides).  Improves the body's use of the hormone insulin by:  Increasing the body's insulin sensitivity.  Reducing the body's insulin needs.  Decreases the risk for heart disease because exercising:  Lowers cholesterol and triglycerides levels.  Increases the levels of good cholesterol (such as high-density lipoproteins [HDL]) in the body.  Lowers blood glucose levels. YOUR ACTIVITY PLAN  Choose an activity that you enjoy and set realistic goals. Your health care provider or diabetes educator can help you make an activity plan that works for you. Exercise regularly as directed by your health care provider. This includes:  Performing resistance training twice a week such as push-ups, sit-ups, lifting weights, or using resistance bands.  Performing 150 minutes of cardio exercises each week such as walking, running, or playing sports.  Staying active and spending no more than 90 minutes at one time being inactive. Even short bursts of exercise are good for you. Three 10-minute sessions spread throughout the day are just as beneficial as a single 30-minute session. Some exercise ideas include:  Taking the dog for a walk.  Taking the stairs instead of the elevator.  Dancing to your favorite song.  Doing an exercise  video.  Doing your favorite exercise with a friend. RECOMMENDATIONS FOR EXERCISING WITH TYPE 1 OR TYPE 2 DIABETES   Check your blood glucose before exercising. If blood glucose levels are greater than 240 mg/dL, check for urine ketones. Do not exercise if ketones are present.  Avoid injecting insulin into areas of the body that are going to be exercised. For example, avoid injecting insulin into:  The arms when playing tennis.  The legs when jogging.  Keep a record of:  Food intake before and after you exercise.  Expected peak times of insulin action.  Blood glucose levels before and after you exercise.  The type and amount of exercise you have done.  Review your records with your health care provider. Your health care provider will help you to develop guidelines for adjusting food intake and insulin amounts before and after exercising.  If you take insulin or oral hypoglycemic agents, watch for signs and symptoms of hypoglycemia. They include:  Dizziness.  Shaking.  Sweating.  Chills.  Confusion.  Drink plenty of water while you exercise to prevent dehydration or heat stroke. Body water is lost during exercise and must be replaced.  Talk to your health care provider before starting an exercise program to make sure it is safe for you. Remember, almost any type of activity is better than none. Document Released: 12/05/2003 Document Revised: 01/29/2014 Document Reviewed: 02/21/2013 ExitCare Patient Information 2015 [employer]. This information is not intended to replace advice given to you by your health care provider. Make sure you discuss any questions you have with your health care provider.  

## 2015-05-02 NOTE — Progress Notes (Signed)
Patient ID: [name], female   DOB: 03/13/1966, 49 y.o.   MRN: [id number]   [name], is a 49 y.o. female  [id number]  [id number]  DOB - Nov 07, 1965  Chief Complaint  Patient presents with  . Chest Pain        Subjective:   [name] is a 49 y.o. female here today for a follow up visit. Patient was seen in the clinic about 2 days ago for chest pain, atypical, EKG was normal, was given appointment to follow-up today. Chest pain has resolved. Patient has no new complaint today. She has a history of extreme anxiety, bipolar disorder, morbid obesity, type 2 diabetes mellitus requiring insulin, chronic diastolic CHF, and hypertension. 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 She said her pain is gone completely, and she is less anxious. Her blood sugar is still uncontrolled but she is getting her medications refilled. Patient has No headache, No chest pain, No abdominal pain - No Nausea, No new weakness tingling or numbness, No Cough - SOB.  No problems updated.  ALLERGIES: Allergies  Allergen Reactions  . Acetaminophen     Fits as a child "seizures-like"  . Caffeine     Tense, anxiety, increased urination  . Lisinopril     Rash with lisinopril; but fosinopril is ok per patient    PAST MEDICAL HISTORY: Past Medical History  Diagnosis Date  . Insulin dependent type 2 diabetes mellitus, uncontrolled   . Chronic diastolic CHF (congestive heart failure)   . Morbid obesity with BMI of 50.0-59.9, adult   . Bipolar disease, chronic   . History of thyrotoxicosis   . OSA (obstructive sleep apnea) 03/06/2011  . Chest pain     a. 2012 Myoview: EF 63%, no isch/infarct  . Chronic diastolic congestive heart failure 07/23/2011  .  Hypertensive heart disease 10/18/2013    MEDICATIONS AT HOME: Prior to Admission medications   Medication Sig Start Date End Date Taking? Authorizing Provider  amLODipine (NORVASC) 10 MG tablet Take 1 tablet (10 mg total) by mouth daily. 12/03/14  Yes Lance Bosch, NP  Blood Glucose Monitoring Suppl (TRUE METRIX METER) W/DEVICE KIT uad 03/04/15  Yes Tresa Garter, MD  fluticasone (FLONASE) 50 MCG/ACT nasal spray Place 2 sprays into both nostrils daily. 01/02/15  Yes Arnoldo Morale, MD  furosemide (LASIX) 80 MG tablet Take 1 tablet (80 mg total) by mouth daily. 09/10/14  Yes Tresa Garter, MD  glipiZIDE (GLUCOTROL XL) 5 MG 24 hr tablet Take 1 tablet (5 mg total) by mouth daily with breakfast. 12/03/14  Yes Lance Bosch, NP  glucose blood (TRUE METRIX BLOOD GLUCOSE TEST) test strip Use as instructed 03/04/15  Yes Ivionna Verley E Doreene Burke, MD  LORazepam (ATIVAN) 1 MG tablet Take 1 tablet (1 mg total) by mouth at bedtime. 07/02/14  Yes Tresa Garter, MD  losartan (COZAAR) 100 MG tablet Take 1 tablet (100 mg total) by mouth daily. 04/25/15  Yes Tresa Garter, MD  metFORMIN (GLUCOPHAGE) 1000 MG tablet Take 1 tablet (1,000 mg total) by mouth 2 (two) times daily with a meal. 12/03/14  Yes Lance Bosch, NP  metoprolol succinate (TOPROL-XL) 50 MG 24 hr tablet Take 1 tablet (50 mg total) by mouth daily. Take  with or immediately following a meal. 11/28/13  Yes Zamira Hickam E Doreene Burke, MD  mirtazapine (REMERON) 30 MG tablet Take 1 tablet (30 mg total) by mouth at bedtime. 03/07/15  Yes Tresa Garter, MD  potassium chloride (K-DUR,KLOR-CON) 10 MEQ tablet Take 1 tablet (10 mEq total) by mouth 2 (two) times daily. 09/10/14  Yes Tresa Garter, MD  risperidone (RISPERDAL) 4 MG tablet Take 1 tablet (4 mg total) by mouth daily. 09/10/14  Yes Tresa Garter, MD  rosuvastatin (CRESTOR) 10 MG tablet Take 1 tablet (10 mg total) by mouth daily. 06/01/13  Yes Ripudeep Krystal Eaton, MD  traMADol (ULTRAM) 50 MG  tablet Take 1 tablet (50 mg total) by mouth every 12 (twelve) hours as needed. 09/10/14  Yes Tresa Garter, MD     Objective:   Filed Vitals:   05/02/15 1457  BP: 146/83  Pulse: 99  Temp: 98.2 F (36.8 C)  TempSrc: Oral  Resp: 18  Weight: 363 lb (164.656 kg)  SpO2: 95%    Exam General appearance : Awake, alert, not in any distress. Speech Clear. Not toxic looking, morbidly obese HEENT: Atraumatic and Normocephalic, pupils equally reactive to light and accomodation Neck: supple, no JVD. No cervical lymphadenopathy.  Chest:Good air entry bilaterally, no added sounds  CVS: S1 S2 regular, no murmurs.  Abdomen: Bowel sounds present, Non tender and not distended with no gaurding, rigidity or rebound. Extremities: B/L Lower Ext shows no edema, both legs are warm to touch Neurology: Awake alert, and oriented X 3, CN II-XII intact, Non focal Skin:No Rash  Data Review Lab Results  Component Value Date   HGBA1C 10.6 03/07/2015   HGBA1C 10.90 12/03/2014   HGBA1C 8.1 07/02/2014     Assessment & Plan   1. Type 2 diabetes mellitus without complication  - Glucose (CBG)  2. Essential hypertension  - We have discussed target BP range and blood pressure goal - I have advised patient to check BP regularly and to call us back or report to clinic if the numbers are consistently higher than 140/90  - We discussed the importance of compliance with medical therapy and DASH diet recommended, consequences of uncontrolled hypertension discussed.  - continue current BP medications  3. Depression (emotion)  Continue current antidepressant. Follow-up with our licensed clinical social worker for counseling today.   4. Morbid obesity  Aim for 30 minutes of exercise most days. Rethink what you drink. Water is great! Aim for 2-3 Carb Choices per meal (30-45 grams) +/- 1 either way  Aim for 0-15 Carbs per snack if hungry  Include protein in moderation with your meals and snacks    Consider reading food labels for Total Carbohydrate and Fat Grams of foods  Consider checking BG at alternate times per day  Continue taking medication as directed Be mindful about how much sugar you are adding to beverages and other foods. Try to decrease. Consider splenda. Fruit Punch - find one with no sugar  Measure and decrease portions of carbohydrate foods  Make your plate and don't go back for seconds  Patient have been counseled extensively about nutrition and exercise  Return if symptoms worsen or fail to improve, for Follow up HTN, Hemoglobin A1C and Follow up, DM.  The patient was given clear instructions to go to ER or return to medical center if symptoms don't improve, worsen or new problems develop. The patient verbalized understanding. The patient was told to call to get lab results if they haven't heard  anything in the next week.   This note has been created with Surveyor, quantity. Any transcriptional errors are unintentional.    [name], MD, [place], [place], [place], [place] and [place], [place]   05/02/2015, 3:31 PM

## 2015-05-02 NOTE — Progress Notes (Signed)
Pt c/o intermittent CP associated w/SOB and fatigue onset yest Sx have subsided Alert, no signs of acute distress.

## 2015-05-10 ENCOUNTER — Other Ambulatory Visit: Payer: Self-pay | Admitting: Family Medicine

## 2015-05-10 ENCOUNTER — Ambulatory Visit: Payer: Self-pay | Attending: Internal Medicine

## 2015-05-10 NOTE — Progress Notes (Signed)
Patient with history of uncontrolled type 2 diabetes mellitus with A1c of 10.6 found in the hallway complaining of dizziness; she missed her breakfast this morning due to her appointment with financial counseling. Brought back to exam room blood sugar 241, BP 147/84 in left arm and 157/100 and right arm, temperature 98.5, heart rate 82. Patient given a pack of graham crackers and water with resulting improvement in symptoms.

## 2015-05-14 ENCOUNTER — Encounter: Payer: Self-pay | Admitting: Pharmacist

## 2015-06-12 ENCOUNTER — Telehealth: Payer: Self-pay | Admitting: Internal Medicine

## 2015-06-12 ENCOUNTER — Other Ambulatory Visit: Payer: Self-pay | Admitting: Internal Medicine

## 2015-06-12 DIAGNOSIS — E119 Type 2 diabetes mellitus without complications: Secondary | ICD-10-CM

## 2015-06-12 NOTE — Telephone Encounter (Signed)
Patient came in requesting a medication refill for glipiZIDE (GLUCOTROL XL). Please follow up.

## 2015-06-17 MED ORDER — GLIPIZIDE ER 5 MG PO TB24: 5.0000 mg | ORAL_TABLET | Freq: Every day | ORAL | Status: DC

## 2015-07-22 ENCOUNTER — Emergency Department (INDEPENDENT_AMBULATORY_CARE_PROVIDER_SITE_OTHER)
Admission: EM | Admit: 2015-07-22 | Discharge: 2015-07-22 | Disposition: A | Discharge status: Home / Self Care | Payer: No Typology Code available for payment source | Source: Home / Self Care | Attending: Family Medicine | Admitting: Family Medicine

## 2015-07-22 ENCOUNTER — Other Ambulatory Visit: Payer: Self-pay | Admitting: Internal Medicine

## 2015-07-22 ENCOUNTER — Encounter (HOSPITAL_COMMUNITY): Payer: Self-pay | Admitting: *Deleted

## 2015-07-22 DIAGNOSIS — B373 Candidiasis of vulva and vagina: Secondary | ICD-10-CM

## 2015-07-22 DIAGNOSIS — B3731 Acute candidiasis of vulva and vagina: Secondary | ICD-10-CM

## 2015-07-22 LAB — POCT I-STAT, CHEM 8
BUN: 11 mg/dL (ref 6–20)
Calcium, Ion: 1.15 mmol/L (ref 1.12–1.23)
Chloride: 94 mmol/L — ABNORMAL LOW (ref 101–111)
Creatinine, Ser: 0.5 mg/dL (ref 0.44–1.00)
Glucose, Bld: 398 mg/dL — ABNORMAL HIGH (ref 65–99)
HEMATOCRIT: 44 % (ref 36.0–46.0)
HEMOGLOBIN: 15 g/dL (ref 12.0–15.0)
Potassium: 3.9 mmol/L (ref 3.5–5.1)
SODIUM: 132 mmol/L — AB (ref 135–145)
TCO2: 23 mmol/L (ref 0–100)

## 2015-07-22 MED ORDER — FLUCONAZOLE 150 MG PO TABS: 150.0000 mg | ORAL_TABLET | Freq: Once | ORAL | Status: DC

## 2015-07-22 MED ORDER — TERCONAZOLE 80 MG VA SUPP: 80.0000 mg | Freq: Every day | VAGINAL | Status: DC

## 2015-07-22 MED ORDER — CLOTRIMAZOLE 1 % EX CREA: TOPICAL_CREAM | CUTANEOUS | Status: DC

## 2015-07-22 NOTE — ED Provider Notes (Signed)
CSN: [id number]     Arrival date & time 07/22/15  1554 History   First MD Initiated Contact with Patient 07/22/15 1743     Chief Complaint  Patient presents with  . Rash   (Consider location/radiation/quality/duration/timing/severity/associated sxs/prior Treatment) Patient is a 49 y.o. female presenting with rash. The history is provided by the patient.  Rash Location:  Ano-genital Ano-genital rash location:  Perineum Quality: itchiness and redness   Severity:  Mild Onset quality:  Gradual Duration:  10 days Progression:  Unchanged Chronicity:  New Context: medications   Context comment:  Bs have been over 300 since lmd stopped insulin b/o hypoglycemia Associated symptoms: no fever     Past Medical History  Diagnosis Date  . Insulin dependent type 2 diabetes mellitus, uncontrolled ([place])   . Chronic diastolic CHF (congestive heart failure) ([place])   . Morbid obesity with BMI of 50.0-59.9, adult ([place])   . Bipolar disease, chronic ([place])   . History of thyrotoxicosis   . OSA (obstructive sleep apnea) 03/06/2011  . Chest pain     a. 2012 Myoview: EF 63%, no isch/infarct  . Chronic diastolic congestive heart failure ([place]) 07/23/2011  . Hypertensive heart disease 10/18/2013   History reviewed. No pertinent past surgical history. Family History  Problem Relation Age of Onset  . Heart failure Father   . Stroke Father   . Heart disease Maternal Grandfather   . Heart disease Paternal Grandfather   . Hypertension Mother    Social History  Substance Use Topics  . Smoking status: Never Smoker   . Smokeless tobacco: Never Used  . Alcohol Use: No   OB History    No data available     Review of Systems  Constitutional: Negative for fever.  HENT: Negative.   Respiratory: Negative.   Cardiovascular: Negative.   Skin: Positive for rash.  All other systems reviewed and are negative.   Allergies  Acetaminophen; Caffeine; and Lisinopril  Home Medications   Prior to Admission  medications   Medication Sig Start Date End Date Taking? Authorizing Provider  amLODipine (NORVASC) 10 MG tablet Take 1 tablet (10 mg total) by mouth daily. 12/03/14   Lance Bosch, NP  Blood Glucose Monitoring Suppl (TRUE METRIX METER) W/DEVICE KIT uad 03/04/15   Tresa Garter, MD  clotrimazole (LOTRIMIN) 1 % cream Apply to affected area 2 times daily 07/22/15   Billy Fischer, MD  fluconazole (DIFLUCAN) 150 MG tablet Take 1 tablet (150 mg total) by mouth once. Repeat in 1 week 07/22/15   Billy Fischer, MD  fluticasone [facility]) 50 MCG/ACT nasal spray Place 2 sprays into both nostrils daily. 01/02/15   Arnoldo Morale, MD  furosemide (LASIX) 80 MG tablet Take 1 tablet (80 mg total) by mouth daily. 09/10/14   Tresa Garter, MD  glipiZIDE (GLUCOTROL XL) 5 MG 24 hr tablet Take 1 tablet (5 mg total) by mouth daily with breakfast. 06/17/15   Tresa Garter, MD  glucose blood (TRUE METRIX BLOOD GLUCOSE TEST) test strip Use as instructed 03/04/15   Tresa Garter, MD  LORazepam (ATIVAN) 1 MG tablet Take 1 tablet (1 mg total) by mouth at bedtime. 07/02/14   Tresa Garter, MD  losartan (COZAAR) 100 MG tablet Take 1 tablet (100 mg total) by mouth daily. 04/25/15   Tresa Garter, MD  metFORMIN (GLUCOPHAGE) 1000 MG tablet Take 1 tablet (1,000 mg total) by mouth 2 (two) times daily with a meal. 12/03/14   Mateo Flow  [place], NP  metoprolol succinate (TOPROL-XL) 50 MG 24 hr tablet Take 1 tablet (50 mg total) by mouth daily. Take with or immediately following a meal. 11/28/13   Tresa Garter, MD  mirtazapine (REMERON) 30 MG tablet Take 1 tablet (30 mg total) by mouth at bedtime. 03/07/15   Tresa Garter, MD  potassium chloride (K-DUR,KLOR-CON) 10 MEQ tablet Take 1 tablet (10 mEq total) by mouth 2 (two) times daily. 09/10/14   Tresa Garter, MD  risperidone (RISPERDAL) 4 MG tablet Take 1 tablet (4 mg total) by mouth daily. 09/10/14   Tresa Garter, MD  rosuvastatin (CRESTOR) 10  MG tablet Take 1 tablet (10 mg total) by mouth daily. 06/01/13   Ripudeep Krystal Eaton, MD  terconazole (TERAZOL 3) 80 MG vaginal suppository Place 1 suppository (80 mg total) vaginally at bedtime. 07/22/15   Billy Fischer, MD  traMADol (ULTRAM) 50 MG tablet Take 1 tablet (50 mg total) by mouth every 12 (twelve) hours as needed. 09/10/14   Tresa Garter, MD  TRUEPLUS LANCETS 28G MISC USE TO TEST BLOOD SUGARS AS DIRECTED BY PHYSICIAN 07/05/15   Tresa Garter, MD   Meds Ordered and Administered this Visit  Medications - No data to display  BP 180/127 mmHg  Pulse 91  Temp(Src) 97.8 F (36.6 C) (Oral)  Resp 26  SpO2 94% No data found.   Physical Exam  Constitutional: She is oriented to person, place, and time. She appears well-developed and well-nourished. No distress.  Neck: Normal range of motion. Neck supple.  Lymphadenopathy:    She has no cervical adenopathy.  Neurological: She is alert and oriented to person, place, and time.  Skin: Skin is warm and dry. Rash noted. There is erythema.  Pt preference not to be examined in private area.  Nursing note and vitals reviewed.   ED Course  Procedures (including critical care time)  Labs Review Labs Reviewed  POCT I-STAT, CHEM 8 - Abnormal; Notable for the following:    Sodium 132 (*)    Chloride 94 (*)    Glucose, Bld 398 (*)    All other components within normal limits   i-stat bs 398 Imaging Review No results found.   Visual Acuity Review  Right Eye Distance:   Left Eye Distance:   Bilateral Distance:    Right Eye Near:   Left Eye Near:    Bilateral Near:         MDM   1. Candidal vaginitis        Billy Fischer, MD 07/25/15 1309

## 2015-07-22 NOTE — ED Notes (Signed)
Pt   Reports  A  Rash  X  10  Days    She  Reports  Being a  Diabetic     And  Hypertensive  That  Has  Not  Taken her  meds   Today     Pt    Reports  Her  Sugars have  Been  Elevated  As  Of  Late     The pt is  Sitting upright on the  Exam table  Speaking in  Complete  sentances  And  Is in no  Acute  Distress

## 2015-07-22 NOTE — Discharge Instructions (Signed)
Your sugar was 398 tonight, tell your doctor, use medicine as prescribed.

## 2015-08-07 ENCOUNTER — Other Ambulatory Visit: Payer: Self-pay

## 2015-08-07 DIAGNOSIS — F3162 Bipolar disorder, current episode mixed, moderate: Secondary | ICD-10-CM

## 2015-08-07 DIAGNOSIS — I10 Essential (primary) hypertension: Secondary | ICD-10-CM

## 2015-08-07 MED ORDER — RISPERIDONE 4 MG PO TABS: 4.0000 mg | ORAL_TABLET | Freq: Every day | ORAL | Status: DC

## 2015-08-07 MED ORDER — LOSARTAN POTASSIUM 100 MG PO TABS
100.0000 mg | ORAL_TABLET | Freq: Every day | ORAL | Status: DC
Start: 2015-08-07 — End: 2015-12-10

## 2015-09-24 ENCOUNTER — Other Ambulatory Visit: Payer: Self-pay | Admitting: Internal Medicine

## 2015-10-03 ENCOUNTER — Encounter (HOSPITAL_COMMUNITY): Payer: Self-pay | Admitting: *Deleted

## 2015-10-03 ENCOUNTER — Emergency Department (HOSPITAL_COMMUNITY): Payer: Self-pay

## 2015-10-03 ENCOUNTER — Encounter (HOSPITAL_COMMUNITY): Payer: Self-pay | Admitting: Emergency Medicine

## 2015-10-03 ENCOUNTER — Emergency Department (INDEPENDENT_AMBULATORY_CARE_PROVIDER_SITE_OTHER)
Admission: EM | Admit: 2015-10-03 | Discharge: 2015-10-03 | Disposition: A | Discharge status: Other Acute Inpatient Hospital | Payer: No Typology Code available for payment source | Source: Home / Self Care | Attending: Family Medicine | Admitting: Family Medicine

## 2015-10-03 ENCOUNTER — Inpatient Hospital Stay (HOSPITAL_COMMUNITY)
Admission: EM | Admit: 2015-10-03 | Discharge: 2015-10-06 | DRG: 292 | Disposition: A | Discharge status: Home / Self Care | Payer: Self-pay | Attending: Internal Medicine | Admitting: Internal Medicine

## 2015-10-03 DIAGNOSIS — Z8249 Family history of ischemic heart disease and other diseases of the circulatory system: Secondary | ICD-10-CM

## 2015-10-03 DIAGNOSIS — Z823 Family history of stroke: Secondary | ICD-10-CM

## 2015-10-03 DIAGNOSIS — G4733 Obstructive sleep apnea (adult) (pediatric): Secondary | ICD-10-CM | POA: Diagnosis present

## 2015-10-03 DIAGNOSIS — Z79899 Other long term (current) drug therapy: Secondary | ICD-10-CM

## 2015-10-03 DIAGNOSIS — I5032 Chronic diastolic (congestive) heart failure: Secondary | ICD-10-CM | POA: Diagnosis present

## 2015-10-03 DIAGNOSIS — I110 Hypertensive heart disease with heart failure: Secondary | ICD-10-CM | POA: Diagnosis present

## 2015-10-03 DIAGNOSIS — Z794 Long term (current) use of insulin: Secondary | ICD-10-CM

## 2015-10-03 DIAGNOSIS — I272 Other secondary pulmonary hypertension: Secondary | ICD-10-CM | POA: Diagnosis present

## 2015-10-03 DIAGNOSIS — E119 Type 2 diabetes mellitus without complications: Secondary | ICD-10-CM

## 2015-10-03 DIAGNOSIS — Z6841 Body Mass Index (BMI) 40.0 and over, adult: Secondary | ICD-10-CM

## 2015-10-03 DIAGNOSIS — F3160 Bipolar disorder, current episode mixed, unspecified: Secondary | ICD-10-CM | POA: Diagnosis present

## 2015-10-03 DIAGNOSIS — F3162 Bipolar disorder, current episode mixed, moderate: Secondary | ICD-10-CM

## 2015-10-03 DIAGNOSIS — I5033 Acute on chronic diastolic (congestive) heart failure: Principal | ICD-10-CM | POA: Diagnosis present

## 2015-10-03 DIAGNOSIS — I5031 Acute diastolic (congestive) heart failure: Secondary | ICD-10-CM | POA: Diagnosis present

## 2015-10-03 DIAGNOSIS — Z9119 Patient's noncompliance with other medical treatment and regimen: Secondary | ICD-10-CM

## 2015-10-03 DIAGNOSIS — Z888 Allergy status to other drugs, medicaments and biological substances status: Secondary | ICD-10-CM

## 2015-10-03 DIAGNOSIS — J209 Acute bronchitis, unspecified: Secondary | ICD-10-CM | POA: Diagnosis present

## 2015-10-03 DIAGNOSIS — IMO0001 Reserved for inherently not codable concepts without codable children: Secondary | ICD-10-CM

## 2015-10-03 DIAGNOSIS — R0600 Dyspnea, unspecified: Secondary | ICD-10-CM

## 2015-10-03 DIAGNOSIS — I509 Heart failure, unspecified: Secondary | ICD-10-CM

## 2015-10-03 DIAGNOSIS — E1165 Type 2 diabetes mellitus with hyperglycemia: Secondary | ICD-10-CM | POA: Diagnosis present

## 2015-10-03 DIAGNOSIS — I10 Essential (primary) hypertension: Secondary | ICD-10-CM | POA: Diagnosis present

## 2015-10-03 LAB — CBC WITH DIFFERENTIAL/PLATELET
Basophils Absolute: 0 10*3/uL (ref 0.0–0.1)
Basophils Relative: 0 %
EOS PCT: 1 %
Eosinophils Absolute: 0.1 10*3/uL (ref 0.0–0.7)
HEMATOCRIT: 34.4 % — AB (ref 36.0–46.0)
Hemoglobin: 10.9 g/dL — ABNORMAL LOW (ref 12.0–15.0)
Lymphocytes Relative: 14 %
Lymphs Abs: 1.4 10*3/uL (ref 0.7–4.0)
MCH: 25.1 pg — ABNORMAL LOW (ref 26.0–34.0)
MCHC: 31.7 g/dL (ref 30.0–36.0)
MCV: 79.3 fL (ref 78.0–100.0)
MONO ABS: 0.5 10*3/uL (ref 0.1–1.0)
MONOS PCT: 5 %
NEUTROS ABS: 7.7 10*3/uL (ref 1.7–7.7)
Neutrophils Relative %: 80 %
PLATELETS: 227 10*3/uL (ref 150–400)
RBC: 4.34 MIL/uL (ref 3.87–5.11)
RDW: 16.6 % — AB (ref 11.5–15.5)
WBC: 9.6 10*3/uL (ref 4.0–10.5)

## 2015-10-03 LAB — I-STAT TROPONIN, ED: Troponin i, poc: 0.01 ng/mL (ref 0.00–0.08)

## 2015-10-03 LAB — I-STAT CHEM 8, ED
BUN: 10 mg/dL (ref 6–20)
CALCIUM ION: 1.12 mmol/L (ref 1.12–1.23)
CHLORIDE: 99 mmol/L — AB (ref 101–111)
CREATININE: 0.5 mg/dL (ref 0.44–1.00)
GLUCOSE: 285 mg/dL — AB (ref 65–99)
HCT: 38 % (ref 36.0–46.0)
Hemoglobin: 12.9 g/dL (ref 12.0–15.0)
Potassium: 3.7 mmol/L (ref 3.5–5.1)
Sodium: 138 mmol/L (ref 135–145)
TCO2: 24 mmol/L (ref 0–100)

## 2015-10-03 MED ORDER — NITROGLYCERIN 2 % TD OINT
1.0000 [in_us] | TOPICAL_OINTMENT | Freq: Four times a day (QID) | TRANSDERMAL | Status: DC
  Administered 2015-10-03: 1 [in_us] via TOPICAL
  Filled 2015-10-03: qty 1

## 2015-10-03 MED ORDER — FUROSEMIDE 10 MG/ML IJ SOLN
40.0000 mg | Freq: Once | INTRAMUSCULAR | Status: DC
  Filled 2015-10-03: qty 4

## 2015-10-03 MED ORDER — FUROSEMIDE 10 MG/ML IJ SOLN
80.0000 mg | Freq: Once | INTRAMUSCULAR | Status: AC
  Administered 2015-10-03: 80 mg via INTRAVENOUS
  Filled 2015-10-03: qty 8

## 2015-10-03 MED FILL — glipiZIDE XL 5 MG TB24: 5 | 30 days supply | Qty: 30 | Fill #1

## 2015-10-03 MED FILL — FUROSEMIDE 80 MG TABLET: 80 | 30 days supply | Qty: 30 | Fill #0

## 2015-10-03 MED FILL — LOSARTAN POTASSIUM 100 MG T: 100 | 30 days supply | Qty: 30 | Fill #0

## 2015-10-03 MED FILL — metFORMIN HCL 1000 MG TABS: 1000 | 30 days supply | Qty: 60 | Fill #0

## 2015-10-03 MED FILL — POTASSIUM CL 10 MEQ TAB SA: 10 | 30 days supply | Qty: 60 | Fill #0

## 2015-10-03 MED FILL — ?AMLODIPINE BESYLATE 10 MG: 10 | 30 days supply | Qty: 30 | Fill #5

## 2015-10-03 MED FILL — !RISPERDAL 2 MG TAB: 2 MG | 30 days supply | Qty: 60 | Fill #0

## 2015-10-03 MED FILL — TRUE METRIX TEST STRIP: 25 days supply | Qty: 100 | Fill #1

## 2015-10-03 NOTE — H&P (Signed)
History and Physical  Patient Name: [name]     ZHY:[id number]    DOB: 03-31-1966    DOA: 10/03/2015 Referring physician: Junius Creamer, PA-C PCP: [name], MD      Chief Complaint: Dyspnea  HPI: [name] is a 50 y.o. female with a past medical history significant for NIDDM, Bipolar I, chronic diastolic CHF, OSA not on CPAP, and HTN who presents with dyspnea.  About three weeks ago, the patient started to develop cough that was persistent and sometimes productive, without fever.  In the last few weeks, she notes that she and her family were celebrating, and so she hasn't been taking her furosemide because she didn't want to pee all the time so she could enjoy herself.  She also notes that she thought the cough was a URI, so she tried the other day to drink a liter of hot tea, which helped her cough.    Now in the last day, the patient feels "like I have a blanket over my face".  She has also had some chest pressure the other day, as well as today a sharp, burning left chest pain.  Her orthopnea and leg swelling are actually stable.  In the ED, the paitent was afebrile and hypertensive.  She had hyperglycemia and normal renal function, and a TNI was negative.  A chest x-ray showed pulmonary edema.  She was administered nitropaste and furosemide and TRH were asked to admit for CHF.     Review of Systems:  All other systems negative except as just noted or noted in the history of present illness.  Allergies  Allergen Reactions  . Acetaminophen     Fits as a child "seizures-like"  . Caffeine     Tense, anxiety, increased urination  . Lisinopril     Rash with lisinopril; but fosinopril is ok per patient    Prior to Admission medications   Medication Sig Start Date End Date Taking? Authorizing Provider  amLODipine (NORVASC) 10 MG tablet Take 1 tablet (10 mg total) by mouth daily. 12/03/14  Yes Lance Bosch, NP  furosemide (LASIX) 80 MG tablet TAKE 1 TABLET BY MOUTH  DAILY. 09/25/15  Yes Tresa Garter, MD  glipiZIDE (GLUCOTROL XL) 5 MG 24 hr tablet Take 1 tablet (5 mg total) by mouth daily with breakfast. 06/17/15  Yes Tresa Garter, MD  metFORMIN (GLUCOPHAGE) 1000 MG tablet Take 1 tablet (1,000 mg total) by mouth 2 (two) times daily with a meal. 12/03/14  Yes Lance Bosch, NP  metoprolol succinate (TOPROL-XL) 50 MG 24 hr tablet Take 1 tablet (50 mg total) by mouth daily. Take with or immediately following a meal. 11/28/13  Yes Olugbemiga E Doreene Burke, MD  mirtazapine (REMERON) 30 MG tablet Take 1 tablet (30 mg total) by mouth at bedtime. 03/07/15  Yes Olugbemiga Essie Christine, MD  potassium chloride (K-DUR) 10 MEQ tablet TAKE 1 TABLET BY MOUTH 2 TIMES DAILY. 09/25/15  Yes Tresa Garter, MD  risperidone (RISPERDAL) 4 MG tablet Take 1 tablet (4 mg total) by mouth daily. 08/07/15  Yes Olugbemiga Essie Christine, MD  traMADol (ULTRAM) 50 MG tablet Take 1 tablet (50 mg total) by mouth every 12 (twelve) hours as needed. Patient taking differently: Take 50 mg by mouth every 12 (twelve) hours as needed for moderate pain.  09/10/14  Yes Tresa Garter, MD  Blood Glucose Monitoring Suppl (TRUE METRIX METER) W/DEVICE KIT uad 03/04/15   Tresa Garter, MD  clotrimazole (LOTRIMIN) 1 %  cream Apply to affected area 2 times daily Patient not taking: Reported on 10/03/2015 07/22/15   Billy Fischer, MD  fluconazole (DIFLUCAN) 150 MG tablet Take 1 tablet (150 mg total) by mouth once. Repeat in 1 week Patient not taking: Reported on 10/03/2015 07/22/15   Billy Fischer, MD  fluticasone [facility]) 50 MCG/ACT nasal spray Place 2 sprays into both nostrils daily. Patient not taking: Reported on 10/03/2015 01/02/15   Arnoldo Morale, MD  glucose blood (TRUE METRIX BLOOD GLUCOSE TEST) test strip Use as instructed 03/04/15   Tresa Garter, MD  LORazepam (ATIVAN) 1 MG tablet Take 1 tablet (1 mg total) by mouth at bedtime. Patient not taking: Reported on 10/03/2015 07/02/14   Tresa Garter,  MD  losartan (COZAAR) 100 MG tablet Take 1 tablet (100 mg total) by mouth daily. Patient not taking: Reported on 10/03/2015 08/07/15   Tresa Garter, MD  metFORMIN (GLUCOPHAGE) 1000 MG tablet Take 1 tablet (1,000 mg total) by mouth 2 (two) times daily with a meal. Needs office visit for more refills Patient not taking: Reported on 10/03/2015 09/25/15   Tresa Garter, MD  rosuvastatin (CRESTOR) 10 MG tablet Take 1 tablet (10 mg total) by mouth daily. Patient not taking: Reported on 10/03/2015 06/01/13   Ripudeep Krystal Eaton, MD  terconazole (TERAZOL 3) 80 MG vaginal suppository Place 1 suppository (80 mg total) vaginally at bedtime. Patient not taking: Reported on 10/03/2015 07/22/15   Billy Fischer, MD  TRUEPLUS LANCETS 28G MISC USE TO TEST BLOOD SUGARS AS DIRECTED BY PHYSICIAN 07/05/15   Tresa Garter, MD    Past Medical History  Diagnosis Date  . Insulin dependent type 2 diabetes mellitus, uncontrolled ([place])   . Chronic diastolic CHF (congestive heart failure) ([place])   . Morbid obesity with BMI of 50.0-59.9, adult ([place])   . Bipolar disease, chronic ([place])   . History of thyrotoxicosis   . OSA (obstructive sleep apnea) 03/06/2011  . Chest pain     a. 2012 Myoview: EF 63%, no isch/infarct  . Chronic diastolic congestive heart failure ([place]) 07/23/2011  . Hypertensive heart disease 10/18/2013    History reviewed. No pertinent past surgical history.  Family history: family history includes Heart disease in her maternal grandfather and paternal grandfather; Heart failure in her father; Hypertension in her mother; Stroke in her father.  Social History: Patient lives with her mother and brothers.  She is ambulatory at [employer].  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  She is independent with all ADLs and walks without assistive device. She does not smoker or drink.       Physical  Exam: BP 142/119 mmHg  Pulse 93  Temp(Src) 98.2 F (36.8 C) (Oral)  Resp 21  SpO2 99% General appearance: Obese adult female, alert and in mild distress from dyspnea and cough.   Eyes: Anicteric, conjunctiva pink, lids and lashes normal.     ENT: No nasal deformity, discharge, or epistaxis.  OP moist without lesions.   Lymph: No cervical lymphadenopath limited by exam. Skin: Warm and dry.   Cardiac: RRR, nl S1-S2, 2/6 SEM.  Capillary refill is brisk.  JVP not visible.  No LE edema.   Respiratory: Mild increased WOB.  Expiratory wheezes diffusely. Abdomen: Abdomen soft without rigidity.  Obese but no TTP. No ascites, distension.   MSK: No deformities  or effusions. Neuro: Sensorium intact and responding to questions, attention normal.  Speech is fluent.  Moves all extremities equally and with normal coordination.    Psych: Behavior appropriate.  Affect normal.  No evidence of aural or visual hallucinations or delusions.       Labs on Admission:  The metabolic panel shows normal electrolytes and renal function. Hyperglycemia. TNI negative The complete blood count shows anemia, normocytic (although repeat shows no anemia).   Radiological Exams on Admission: Personally reviewed: Dg Chest 2 View 10/03/2015   Cardiomegaly and pulmonary edema.   EKG: Independently reviewed. Sinus rate 91, QTc 461, and no STTW changes.   Echocardiogram 2015: EF 65-70% and no sig valve disease   Myoview 2014: Normal    Assessment/Plan 1. Acute diastolic CHF:  This is new.  Likely from medical non-compliance, possibly recent URI contributing.  -Admit to telemetry -Furosemide 80 mg IV -[place] I/O, daily weights, goal net negative 1-1.5L daily -Repeat Echocardiogram -Continue home BB and ARB -K supplement and trend BMP to monitor renal function daily -Troponin x3 is ordered -Check TSH   2. NIDDM with hyperglycemia at admission:  Last HgbA1c >10% in June -Hold metformin while  inpatient -Continue glipizide -High dose corrections -Check HgbA1c  3. Bipolar:  -Continue risperidone and mirtazapine  4. HTN:  -Continue amlodipine, BB, and ARB    DVT PPx: Lovenox Diet: Heart healthy, carb reduced Consultants: None Code Status: Full Family Communication: None  Medical decision making: What exists of the patient's previous chart was reviewed in depth and the case was discussed with Junius Creamer, PA-C. Patient seen 11:05 PM on 10/03/2015.  Disposition Plan:  Admit for CHF exacerbation.  Diuresis and repeat echocardiogram.  Anticipate 3-4 days hospitalization.      [name] [facility] Pager [phone]

## 2015-10-03 NOTE — ED Notes (Signed)
Gave pt water, per Mikki Santee - RN

## 2015-10-03 NOTE — ED Notes (Signed)
Pt   Placed  On  Cardiac   Monitor     Nasal  o2  At  2  l  / min     20  Angio  r  Hand   1  Att

## 2015-10-03 NOTE — ED Notes (Signed)
Pt  Reports  Symptoms  Of  Cough   /  Congested       With  Pain  In  Her  Chest  When  She  Coughs      Symptoms  X    3  Days    pt  Reports    Symptoms  Worse  Last 2-3  Days         Cough is  Productive      Relative  Ill  Recently  As   Well  With  Similar  Symptoms

## 2015-10-03 NOTE — ED Provider Notes (Signed)
CSN: [id number]     Arrival date & time 10/03/15  1520 History   First MD Initiated Contact with Patient 10/03/15 1723     Chief Complaint  Patient presents with  . Cough   (Consider location/radiation/quality/duration/timing/severity/associated sxs/prior Treatment) Patient is a 50 y.o. female presenting with cough. The history is provided by the patient and a parent.  Cough Cough characteristics:  Productive Sputum characteristics:  Yellow Severity:  Moderate Chronicity:  Recurrent Smoker: no   Relieved by:  None tried Worsened by:  Nothing tried Ineffective treatments:  None tried Associated symptoms: rhinorrhea and shortness of breath   Associated symptoms: no fever and no wheezing     Past Medical History  Diagnosis Date  . Insulin dependent type 2 diabetes mellitus, uncontrolled ([place])   . Chronic diastolic CHF (congestive heart failure) ([place])   . Morbid obesity with BMI of 50.0-59.9, adult ([place])   . Bipolar disease, chronic ([place])   . History of thyrotoxicosis   . OSA (obstructive sleep apnea) 03/06/2011  . Chest pain     a. 2012 Myoview: EF 63%, no isch/infarct  . Chronic diastolic congestive heart failure ([place]) 07/23/2011  . Hypertensive heart disease 10/18/2013   History reviewed. No pertinent past surgical history. Family History  Problem Relation Age of Onset  . Heart failure Father   . Stroke Father   . Heart disease Maternal Grandfather   . Heart disease Paternal Grandfather   . Hypertension Mother    Social History  Substance Use Topics  . Smoking status: Never Smoker   . Smokeless tobacco: Never Used  . Alcohol Use: No   OB History    No data available     Review of Systems  Constitutional: Negative for fever.  HENT: Positive for congestion, postnasal drip and rhinorrhea.   Respiratory: Positive for cough and shortness of breath. Negative for wheezing.   Cardiovascular: Positive for leg swelling.  All other systems reviewed and are  negative.   Allergies  Acetaminophen; Caffeine; and Lisinopril  Home Medications   Prior to Admission medications   Medication Sig Start Date End Date Taking? Authorizing Provider  amLODipine (NORVASC) 10 MG tablet Take 1 tablet (10 mg total) by mouth daily. 12/03/14   Lance Bosch, NP  Blood Glucose Monitoring Suppl (TRUE METRIX METER) W/DEVICE KIT uad 03/04/15   Tresa Garter, MD  clotrimazole (LOTRIMIN) 1 % cream Apply to affected area 2 times daily 07/22/15   Billy Fischer, MD  fluconazole (DIFLUCAN) 150 MG tablet Take 1 tablet (150 mg total) by mouth once. Repeat in 1 week 07/22/15   Billy Fischer, MD  fluticasone [facility]) 50 MCG/ACT nasal spray Place 2 sprays into both nostrils daily. 01/02/15   Arnoldo Morale, MD  furosemide (LASIX) 80 MG tablet TAKE 1 TABLET BY MOUTH DAILY. 09/25/15   Tresa Garter, MD  glipiZIDE (GLUCOTROL XL) 5 MG 24 hr tablet Take 1 tablet (5 mg total) by mouth daily with breakfast. 06/17/15   Tresa Garter, MD  glucose blood (TRUE METRIX BLOOD GLUCOSE TEST) test strip Use as instructed 03/04/15   Tresa Garter, MD  LORazepam (ATIVAN) 1 MG tablet Take 1 tablet (1 mg total) by mouth at bedtime. 07/02/14   Tresa Garter, MD  losartan (COZAAR) 100 MG tablet Take 1 tablet (100 mg total) by mouth daily. 08/07/15   Tresa Garter, MD  metFORMIN (GLUCOPHAGE) 1000 MG tablet Take 1 tablet (1,000 mg total) by mouth 2 (two) times  daily with a meal. 12/03/14   Lance Bosch, NP  metFORMIN (GLUCOPHAGE) 1000 MG tablet Take 1 tablet (1,000 mg total) by mouth 2 (two) times daily with a meal. Needs office visit for more refills 09/25/15   Tresa Garter, MD  metoprolol succinate (TOPROL-XL) 50 MG 24 hr tablet Take 1 tablet (50 mg total) by mouth daily. Take with or immediately following a meal. 11/28/13   Tresa Garter, MD  mirtazapine (REMERON) 30 MG tablet Take 1 tablet (30 mg total) by mouth at bedtime. 03/07/15   Tresa Garter, MD  potassium  chloride (K-DUR) 10 MEQ tablet TAKE 1 TABLET BY MOUTH 2 TIMES DAILY. 09/25/15   Tresa Garter, MD  risperidone (RISPERDAL) 4 MG tablet Take 1 tablet (4 mg total) by mouth daily. 08/07/15   Tresa Garter, MD  rosuvastatin (CRESTOR) 10 MG tablet Take 1 tablet (10 mg total) by mouth daily. 06/01/13   Ripudeep Krystal Eaton, MD  terconazole (TERAZOL 3) 80 MG vaginal suppository Place 1 suppository (80 mg total) vaginally at bedtime. 07/22/15   Billy Fischer, MD  traMADol (ULTRAM) 50 MG tablet Take 1 tablet (50 mg total) by mouth every 12 (twelve) hours as needed. 09/10/14   Tresa Garter, MD  TRUEPLUS LANCETS 28G MISC USE TO TEST BLOOD SUGARS AS DIRECTED BY PHYSICIAN 07/05/15   Tresa Garter, MD   Meds Ordered and Administered this Visit  Medications - No data to display  BP 130/89 mmHg  Pulse 102  Temp(Src) 99.5 F (37.5 C) (Oral)  Resp 16  SpO2 95% No data found.   Physical Exam  Constitutional: She is oriented to person, place, and time. She appears well-developed and well-nourished. No distress.  Wheel chair bound, morbid obesity.  Eyes: Pupils are equal, round, and reactive to light.  Cardiovascular: Regular rhythm.  Tachycardia present.   Pulmonary/Chest: She is in respiratory distress.  Musculoskeletal: She exhibits edema.  Neurological: She is alert and oriented to person, place, and time.  Skin: Skin is warm and dry.  Nursing note and vitals reviewed.   ED Course  Procedures (including critical care time)  Labs Review Labs Reviewed - No data to display  Imaging Review No results found.   Visual Acuity Review  Right Eye Distance:   Left Eye Distance:   Bilateral Distance:    Right Eye Near:   Left Eye Near:    Bilateral Near:         MDM   1. Acute dyspnea   sent for eval of sob , chest pain similar to past right heart chf for 3days.    Billy Fischer, MD 10/03/15 1806

## 2015-10-03 NOTE — ED Provider Notes (Signed)
ED ECG REPORT   Date: 10/03/2015 2049  Rate: 91  Rhythm: normal sinus rhythm  QRS Axis: right  Intervals: normal  ST/T Wave abnormalities: normal  Conduction Disutrbances:none  Narrative Interpretation: poor R wave progression   I have personally reviewed the EKG tracing and agree with the computerized printout as noted.   Ripley Fraise, MD 10/03/15 2105

## 2015-10-03 NOTE — ED Notes (Signed)
CARE  LINK  HAS  NO  UNIT  AVAILABLE   EMS  CALLED

## 2015-10-03 NOTE — ED Provider Notes (Addendum)
CSN: [id number]     Arrival date & time 10/03/15  1921 History   First MD Initiated Contact with Patient 10/03/15 1958     Chief Complaint  Patient presents with  . Shortness of Breath     (Consider location/radiation/quality/duration/timing/severity/associated sxs/prior Treatment) HPI Comments: This is a morbidly obese 50 year old African-American female who states for the past 2 weeks.  She's had URI symptoms with intermittent episodes of chest pain.  She states she has a history of CHF and nose difference between pain from her cough and true chest pain.  She states 4 days ago.  She had fruit chest pain that resolved spontaneously.  Today she states she has chest discomfort.  It is stabbing in nature, worse with coughing. She has not seen her cardiologist and never a year.  She seen her primary care physician in approximately 3 months ago.  She did not call him during this current illness  Patient is a 50 y.o. female presenting with shortness of breath. The history is provided by the patient.  Shortness of Breath Severity:  Moderate Onset quality:  Gradual Duration:  2 weeks Timing:  Intermittent Progression:  Worsening Chronicity:  Recurrent Relieved by:  Nothing Worsened by:  Nothing tried Ineffective treatments:  None tried Associated symptoms: chest pain and cough   Associated symptoms: no diaphoresis and no fever   Cough:    Cough characteristics:  Productive   Sputum characteristics:  Nondescript   Severity:  Moderate   Onset quality:  Gradual   Duration:  3 days   Timing:  Intermittent   Progression:  Unchanged   Chronicity:  Recurrent   Past Medical History  Diagnosis Date  . Insulin dependent type 2 diabetes mellitus, uncontrolled ([place])   . Chronic diastolic CHF (congestive heart failure) ([place])   . Morbid obesity with BMI of 50.0-59.9, adult ([place])   . Bipolar disease, chronic ([place])   . History of thyrotoxicosis   . OSA (obstructive sleep apnea) 03/06/2011  . Chest  pain     a. 2012 Myoview: EF 63%, no isch/infarct  . Chronic diastolic congestive heart failure ([place]) 07/23/2011  . Hypertensive heart disease 10/18/2013   History reviewed. No pertinent past surgical history. Family History  Problem Relation Age of Onset  . Heart failure Father   . Stroke Father   . Heart disease Maternal Grandfather   . Heart disease Paternal Grandfather   . Hypertension Mother    Social History  Substance Use Topics  . Smoking status: Never Smoker   . Smokeless tobacco: Never Used  . Alcohol Use: No   OB History    No data available     Review of Systems  Constitutional: Negative for fever, chills and diaphoresis.  Respiratory: Positive for cough and shortness of breath.   Cardiovascular: Positive for chest pain and leg swelling.  Gastrointestinal: Positive for nausea.  All other systems reviewed and are negative.     Allergies  Acetaminophen; Caffeine; and Lisinopril  Home Medications   Prior to Admission medications   Medication Sig Start Date End Date Taking? Authorizing Provider  amLODipine (NORVASC) 10 MG tablet Take 1 tablet (10 mg total) by mouth daily. 12/03/14  Yes Lance Bosch, NP  furosemide (LASIX) 80 MG tablet TAKE 1 TABLET BY MOUTH DAILY. 09/25/15  Yes Tresa Garter, MD  glipiZIDE (GLUCOTROL XL) 5 MG 24 hr tablet Take 1 tablet (5 mg total) by mouth daily with breakfast. 06/17/15  Yes Tresa Garter, MD  metFORMIN (GLUCOPHAGE) 1000 MG tablet Take 1 tablet (1,000 mg total) by mouth 2 (two) times daily with a meal. 12/03/14  Yes Lance Bosch, NP  metFORMIN (GLUCOPHAGE) 1000 MG tablet Take 1,000 mg by mouth daily.   Yes Historical Provider, MD  metoprolol succinate (TOPROL-XL) 50 MG 24 hr tablet Take 1 tablet (50 mg total) by mouth daily. Take with or immediately following a meal. 11/28/13  Yes Olugbemiga E Doreene Burke, MD  mirtazapine (REMERON) 30 MG tablet Take 1 tablet (30 mg total) by mouth at bedtime. 03/07/15  Yes Olugbemiga Essie Christine,  MD  potassium chloride (K-DUR) 10 MEQ tablet TAKE 1 TABLET BY MOUTH 2 TIMES DAILY. 09/25/15  Yes Tresa Garter, MD  risperidone (RISPERDAL) 4 MG tablet Take 1 tablet (4 mg total) by mouth daily. 08/07/15  Yes Olugbemiga Essie Christine, MD  traMADol (ULTRAM) 50 MG tablet Take 1 tablet (50 mg total) by mouth every 12 (twelve) hours as needed. Patient taking differently: Take 50 mg by mouth every 12 (twelve) hours as needed for moderate pain.  09/10/14  Yes Tresa Garter, MD  Blood Glucose Monitoring Suppl (TRUE METRIX METER) W/DEVICE KIT uad 03/04/15   Tresa Garter, MD  clotrimazole (LOTRIMIN) 1 % cream Apply to affected area 2 times daily Patient not taking: Reported on 10/03/2015 07/22/15   Billy Fischer, MD  fluconazole (DIFLUCAN) 150 MG tablet Take 1 tablet (150 mg total) by mouth once. Repeat in 1 week Patient not taking: Reported on 10/03/2015 07/22/15   Billy Fischer, MD  fluticasone [facility]) 50 MCG/ACT nasal spray Place 2 sprays into both nostrils daily. Patient not taking: Reported on 10/03/2015 01/02/15   Arnoldo Morale, MD  glucose blood (TRUE METRIX BLOOD GLUCOSE TEST) test strip Use as instructed 03/04/15   Tresa Garter, MD  LORazepam (ATIVAN) 1 MG tablet Take 1 tablet (1 mg total) by mouth at bedtime. Patient not taking: Reported on 10/03/2015 07/02/14   Tresa Garter, MD  losartan (COZAAR) 100 MG tablet Take 1 tablet (100 mg total) by mouth daily. Patient not taking: Reported on 10/03/2015 08/07/15   Tresa Garter, MD  metFORMIN (GLUCOPHAGE) 1000 MG tablet Take 1 tablet (1,000 mg total) by mouth 2 (two) times daily with a meal. Needs office visit for more refills Patient not taking: Reported on 10/03/2015 09/25/15   Tresa Garter, MD  rosuvastatin (CRESTOR) 10 MG tablet Take 1 tablet (10 mg total) by mouth daily. Patient not taking: Reported on 10/03/2015 06/01/13   Ripudeep Krystal Eaton, MD  terconazole (TERAZOL 3) 80 MG vaginal suppository Place 1 suppository (80 mg total)  vaginally at bedtime. Patient not taking: Reported on 10/03/2015 07/22/15   Billy Fischer, MD  TRUEPLUS LANCETS 28G MISC USE TO TEST BLOOD SUGARS AS DIRECTED BY PHYSICIAN 07/05/15   Tresa Garter, MD   BP 142/119 mmHg  Pulse 93  Temp(Src) 98.2 F (36.8 C) (Oral)  Resp 21  SpO2 99% Physical Exam  Constitutional: She appears well-developed and well-nourished.  HENT:  Head: Normocephalic.  Neck: Normal range of motion.  Cardiovascular: Regular rhythm.  Tachycardia present.   Pulmonary/Chest: Effort normal. She has no wheezes.  Abdominal: Soft.  Musculoskeletal: Normal range of motion.  Neurological: She is alert.  Skin: Skin is warm.  Nursing note and vitals reviewed.   ED Course  Procedures (including critical care time) Labs Review Labs Reviewed  CBC WITH DIFFERENTIAL/PLATELET - Abnormal; Notable for the following:    Hemoglobin 10.9 (*)  HCT 34.4 (*)    MCH 25.1 (*)    RDW 16.6 (*)    All other components within normal limits  I-STAT CHEM 8, ED - Abnormal; Notable for the following:    Chloride 99 (*)    Glucose, Bld 285 (*)    All other components within normal limits  I-STAT TROPOININ, ED    Imaging Review Dg Chest 2 View  10/03/2015  CLINICAL DATA:  Productive cough, shortness of breath and left-sided chest pain for 2 weeks. Progressive symptoms. EXAM: CHEST  2 VIEW COMPARISON:  11/02/2013 FINDINGS: Mild cardiomegaly. Bilateral hilar prominence is similar to prior exam. Pulmonary edema is again seen. No confluent airspace disease. No pleural effusion. No pneumothorax. No acute osseous abnormalities. IMPRESSION: Findings consistent with CHF. Electronically Signed   By: Jeb Levering M.D.   On: 10/03/2015 20:34   I have personally reviewed and evaluated these images and lab results as part of my medical decision-making.   EKG Interpretation   Date/Time:  Thursday October 03 2015 20:49:35 EST Ventricular Rate:  91 PR Interval:  162 QRS Duration: 80 QT  Interval:  375 QTC Calculation: 461 R Axis:   100 Text Interpretation:  Sinus rhythm Probable left atrial enlargement  Anterior infarct, old Confirmed by Christy Gentles  MD, [place] ([id number]) on  10/03/2015 10:07:22 PM    Will give IV lasix and topical Nitro and admit patient  MDM   Final diagnoses:  Acute on chronic congestive heart failure, unspecified congestive heart failure type ([place])         Junius Creamer, NP 10/03/15 2251  Ripley Fraise, MD 10/12/15 [place], NP 10/25/15 0045  Ripley Fraise, MD 10/25/15 [phone]

## 2015-10-03 NOTE — ED Notes (Signed)
Pt states she has had a productive cough, shortness of breath, and intermittent chest pain for past two weeks. Pt was originally seen at Urgent Care earlier and transported here.

## 2015-10-03 NOTE — ED Notes (Signed)
Report attempted. Receiving RN in report.

## 2015-10-04 ENCOUNTER — Inpatient Hospital Stay (HOSPITAL_COMMUNITY): Payer: Self-pay

## 2015-10-04 DIAGNOSIS — I509 Heart failure, unspecified: Secondary | ICD-10-CM

## 2015-10-04 LAB — BASIC METABOLIC PANEL
ANION GAP: 9 (ref 5–15)
Anion gap: 12 (ref 5–15)
BUN: 12 mg/dL (ref 6–20)
BUN: 11 mg/dL (ref 6–20)
CHLORIDE: 98 mmol/L — AB (ref 101–111)
CO2: 25 mmol/L (ref 22–32)
CO2: 27 mmol/L (ref 22–32)
CREATININE: 0.83 mg/dL (ref 0.44–1.00)
Calcium: 8.8 mg/dL — ABNORMAL LOW (ref 8.9–10.3)
Calcium: 8.7 mg/dL — ABNORMAL LOW (ref 8.9–10.3)
Chloride: 98 mmol/L — ABNORMAL LOW (ref 101–111)
Creatinine, Ser: 0.64 mg/dL (ref 0.44–1.00)
GFR calc Af Amer: >60 mL/min (ref >60)
GFR calc non Af Amer: >60 mL/min (ref >60)
Glucose, Bld: 321 mg/dL — ABNORMAL HIGH (ref 65–99)
Glucose, Bld: 315 mg/dL — ABNORMAL HIGH (ref 65–99)
POTASSIUM: 3.6 mmol/L (ref 3.5–5.1)
Potassium: 3.6 mmol/L (ref 3.5–5.1)
SODIUM: 134 mmol/L — AB (ref 135–145)
Sodium: 135 mmol/L (ref 135–145)

## 2015-10-04 LAB — GLUCOSE, CAPILLARY
GLUCOSE-CAPILLARY: 260 mg/dL — AB (ref 65–99)
Glucose-Capillary: 323 mg/dL — ABNORMAL HIGH (ref 65–99)
Glucose-Capillary: 215 mg/dL — ABNORMAL HIGH (ref 65–99)
Glucose-Capillary: 259 mg/dL — ABNORMAL HIGH (ref 65–99)
Glucose-Capillary: 293 mg/dL — ABNORMAL HIGH (ref 65–99)

## 2015-10-04 LAB — CBC
HCT: 34.1 % — ABNORMAL LOW (ref 36.0–46.0)
HCT: 34.1 % — ABNORMAL LOW (ref 36.0–46.0)
HEMOGLOBIN: 10.6 g/dL — AB (ref 12.0–15.0)
Hemoglobin: 10.5 g/dL — ABNORMAL LOW (ref 12.0–15.0)
MCH: 24.6 pg — AB (ref 26.0–34.0)
MCH: 24.7 pg — AB (ref 26.0–34.0)
MCHC: 30.8 g/dL (ref 30.0–36.0)
MCHC: 31.1 g/dL (ref 30.0–36.0)
MCV: 79.5 fL (ref 78.0–100.0)
MCV: 79.9 fL (ref 78.0–100.0)
PLATELETS: 224 10*3/uL (ref 150–400)
PLATELETS: 233 10*3/uL (ref 150–400)
RBC: 4.27 MIL/uL (ref 3.87–5.11)
RBC: 4.29 MIL/uL (ref 3.87–5.11)
RDW: 16.8 % — ABNORMAL HIGH (ref 11.5–15.5)
RDW: 16.9 % — ABNORMAL HIGH (ref 11.5–15.5)
WBC: 8.9 10*3/uL (ref 4.0–10.5)
WBC: 9.4 10*3/uL (ref 4.0–10.5)

## 2015-10-04 LAB — CREATININE, SERUM
CREATININE: 0.79 mg/dL (ref 0.44–1.00)
GFR calc Af Amer: >60 mL/min (ref >60)

## 2015-10-04 LAB — TROPONIN I
Troponin I: <0.03 ng/mL (ref <0.031)
Troponin I: 0.11 ng/mL — ABNORMAL HIGH (ref <0.031)

## 2015-10-04 LAB — TSH: TSH: 5.091 u[IU]/mL — ABNORMAL HIGH (ref 0.350–4.500)

## 2015-10-04 MED ORDER — INSULIN ASPART 100 UNIT/ML ~~LOC~~ SOLN
0.0000 [IU] | Freq: Every day | SUBCUTANEOUS | Status: DC
  Administered 2015-10-04: 4 [IU] via SUBCUTANEOUS
  Administered 2015-10-04: 3 [IU] via SUBCUTANEOUS

## 2015-10-04 MED ORDER — INSULIN ASPART 100 UNIT/ML ~~LOC~~ SOLN
0.0000 [IU] | Freq: Three times a day (TID) | SUBCUTANEOUS | Status: DC
  Administered 2015-10-04: 11 [IU] via SUBCUTANEOUS
  Administered 2015-10-04: 7 [IU] via SUBCUTANEOUS
  Administered 2015-10-04: 11 [IU] via SUBCUTANEOUS
  Administered 2015-10-05: 7 [IU] via SUBCUTANEOUS
  Administered 2015-10-05: 11 [IU] via SUBCUTANEOUS
  Administered 2015-10-05 – 2015-10-06 (×2): 15 [IU] via SUBCUTANEOUS

## 2015-10-04 MED ORDER — SODIUM CHLORIDE 0.9 % IJ SOLN
3.0000 mL | Freq: Two times a day (BID) | INTRAMUSCULAR | Status: DC
  Administered 2015-10-04 – 2015-10-06 (×5): 3 mL via INTRAVENOUS

## 2015-10-04 MED ORDER — TRAMADOL HCL 50 MG PO TABS: 50.0000 mg | ORAL_TABLET | Freq: Two times a day (BID) | ORAL | Status: DC | PRN

## 2015-10-04 MED ORDER — ENOXAPARIN SODIUM 80 MG/0.8ML ~~LOC~~ SOLN
80.0000 mg | SUBCUTANEOUS | Status: DC
  Administered 2015-10-04 – 2015-10-06 (×3): 80 mg via SUBCUTANEOUS
  Filled 2015-10-04 (×3): qty 0.8

## 2015-10-04 MED ORDER — AMLODIPINE BESYLATE 10 MG PO TABS
10.0000 mg | ORAL_TABLET | Freq: Every day | ORAL | Status: DC
  Administered 2015-10-04 – 2015-10-06 (×3): 10 mg via ORAL
  Filled 2015-10-04 (×3): qty 1

## 2015-10-04 MED ORDER — METOPROLOL SUCCINATE ER 50 MG PO TB24
50.0000 mg | ORAL_TABLET | Freq: Every day | ORAL | Status: DC
  Administered 2015-10-04 – 2015-10-06 (×3): 50 mg via ORAL
  Filled 2015-10-04 (×3): qty 1

## 2015-10-04 MED ORDER — FUROSEMIDE 10 MG/ML IJ SOLN
80.0000 mg | Freq: Every day | INTRAMUSCULAR | Status: DC
  Administered 2015-10-04: 80 mg via INTRAVENOUS
  Filled 2015-10-04: qty 8

## 2015-10-04 MED ORDER — POTASSIUM CHLORIDE CRYS ER 20 MEQ PO TBCR
20.0000 meq | EXTENDED_RELEASE_TABLET | Freq: Two times a day (BID) | ORAL | Status: DC
  Administered 2015-10-04 – 2015-10-06 (×6): 20 meq via ORAL
  Filled 2015-10-04 (×6): qty 1

## 2015-10-04 MED ORDER — LOSARTAN POTASSIUM 50 MG PO TABS
100.0000 mg | ORAL_TABLET | Freq: Every day | ORAL | Status: DC
Start: 2015-10-04 — End: 2015-10-06
  Administered 2015-10-04 – 2015-10-06 (×3): 100 mg via ORAL
  Filled 2015-10-04 (×3): qty 2

## 2015-10-04 MED ORDER — MIRTAZAPINE 30 MG PO TABS
30.0000 mg | ORAL_TABLET | Freq: Every day | ORAL | Status: DC
  Administered 2015-10-04 – 2015-10-05 (×3): 30 mg via ORAL
  Filled 2015-10-04: qty 1
  Filled 2015-10-04: qty 2
  Filled 2015-10-04: qty 1
  Filled 2015-10-04 (×2): qty 2
  Filled 2015-10-04 (×2): qty 1

## 2015-10-04 MED ORDER — FUROSEMIDE 10 MG/ML IJ SOLN
60.0000 mg | Freq: Two times a day (BID) | INTRAMUSCULAR | Status: DC
  Administered 2015-10-04 – 2015-10-06 (×4): 60 mg via INTRAVENOUS
  Filled 2015-10-04 (×4): qty 6

## 2015-10-04 MED ORDER — LORATADINE 10 MG PO TABS
10.0000 mg | ORAL_TABLET | Freq: Every day | ORAL | Status: DC
  Administered 2015-10-04 – 2015-10-06 (×3): 10 mg via ORAL
  Filled 2015-10-04 (×3): qty 1

## 2015-10-04 MED ORDER — INSULIN GLARGINE 100 UNIT/ML ~~LOC~~ SOLN
10.0000 [IU] | Freq: Every day | SUBCUTANEOUS | Status: DC
Start: 2015-10-04 — End: 2015-10-05
  Administered 2015-10-04: 10 [IU] via SUBCUTANEOUS
  Filled 2015-10-04 (×2): qty 0.1

## 2015-10-04 MED ORDER — RISPERIDONE 1 MG PO TABS
4.0000 mg | ORAL_TABLET | Freq: Every day | ORAL | Status: DC
  Administered 2015-10-04 – 2015-10-06 (×3): 4 mg via ORAL
  Filled 2015-10-04 (×4): qty 4

## 2015-10-04 MED ORDER — GLIPIZIDE ER 5 MG PO TB24
5.0000 mg | ORAL_TABLET | Freq: Every day | ORAL | Status: DC
  Administered 2015-10-04 – 2015-10-05 (×2): 5 mg via ORAL
  Filled 2015-10-04 (×4): qty 1

## 2015-10-04 MED ORDER — LEVOFLOXACIN 500 MG PO TABS
500.0000 mg | ORAL_TABLET | Freq: Every day | ORAL | Status: DC
  Administered 2015-10-04 – 2015-10-06 (×3): 500 mg via ORAL
  Filled 2015-10-04 (×3): qty 1

## 2015-10-04 MED ORDER — OXYMETAZOLINE HCL 0.05 % NA SOLN
1.0000 | Freq: Two times a day (BID) | NASAL | Status: DC
  Administered 2015-10-04 – 2015-10-06 (×5): 1 via NASAL
  Filled 2015-10-04: qty 15

## 2015-10-04 MED ORDER — FLUTICASONE PROPIONATE 50 MCG/ACT NA SUSP
2.0000 | Freq: Every day | NASAL | Status: DC
  Administered 2015-10-04 – 2015-10-06 (×3): 2 via NASAL
  Filled 2015-10-04: qty 16

## 2015-10-04 MED ORDER — IPRATROPIUM-ALBUTEROL 0.5-2.5 (3) MG/3ML IN SOLN
3.0000 mL | Freq: Three times a day (TID) | RESPIRATORY_TRACT | Status: DC
  Administered 2015-10-04 – 2015-10-06 (×6): 3 mL via RESPIRATORY_TRACT
  Filled 2015-10-04 (×6): qty 3

## 2015-10-04 MED ORDER — GUAIFENESIN ER 600 MG PO TB12
600.0000 mg | ORAL_TABLET | Freq: Two times a day (BID) | ORAL | Status: DC
  Administered 2015-10-04 – 2015-10-06 (×5): 600 mg via ORAL
  Filled 2015-10-04 (×5): qty 1

## 2015-10-04 NOTE — Progress Notes (Signed)
  Echocardiogram 2D Echocardiogram has been performed.  Jennette Dubin 10/04/2015, 1:54 PM

## 2015-10-04 NOTE — Progress Notes (Signed)
PATIENT DETAILS Name: [name] Age: 50 y.o. Sex: female Date of Birth: 10/25/1965 Admit Date: 10/03/2015 Admitting Physician [name], MD [id number], Gabrielle Dare, MD  Subjective: Feels slightly better than yesterday, continues to have nasal congestion/cough.  Assessment/Plan: Principal Problem: Acute diastolic CHF (congestive heart failure): Secondary to noncompliance to Lasix. Continue Lasix 60 mg IV twice a day. Follow weights, negative balance of 1.2 L so far.  Active Problems: Acute bronchitis: Complains of cough/wheezing/nasal congestion ongoing for the past 2 weeks. Start anti-sinus therapy with Claritin/Flonase/Afrin, empirically started on levofloxacin, bronchodilators. Follow clinical course  Type 2 diabetes: Start low-dose Lantus-10 units daily at bedtime, continue SSI. Resume oral hypoglycemic agents on discharge.  Hypertension: Controlled-continue amlodipine, metoprolol and losartan  Pulmonary HTN: Repeat transthoracic echocardiogram-suspect is related to obesity  Bipolar disorder: She is stable-continue Risperdal, Remeron  Morbid obesity: Counseled importance of weight loss  OSA (obstructive sleep apnea)- non compliant with C-pap  Disposition: Remain inpatient-Home 2-3 days  Antimicrobial agents  See below  Anti-infectives    None      DVT Prophylaxis: Prophylactic Lovenox   Code Status: Full code   Family Communication None at bedside  Procedures: None  CONSULTS:  None  Time spent 30 minutes-Greater than 50% of this time was spent in counseling, explanation of diagnosis, planning of further management, and coordination of care.  MEDICATIONS: Scheduled Meds: . amLODipine  10 mg Oral Daily  . enoxaparin (LOVENOX) injection  80 mg Subcutaneous Q24H  . fluticasone  2 spray Each Nare Daily  . furosemide  60 mg Intravenous Q12H  . glipiZIDE  5 mg Oral Q breakfast  . guaiFENesin  600 mg Oral BID  . insulin  aspart  0-20 Units Subcutaneous TID WC  . insulin aspart  0-5 Units Subcutaneous QHS  . ipratropium-albuterol  3 mL Nebulization TID  . loratadine  10 mg Oral Daily  . losartan  100 mg Oral Daily  . metoprolol succinate  50 mg Oral Daily  . mirtazapine  30 mg Oral QHS  . oxymetazoline  1 spray Each Nare BID  . potassium chloride  20 mEq Oral BID  . risperidone  4 mg Oral Daily  . sodium chloride  3 mL Intravenous Q12H   Continuous Infusions:  PRN Meds:.traMADol    PHYSICAL EXAM: Vital signs in last 24 hours: Filed Vitals:   10/04/15 0129 10/04/15 0428 10/04/15 0739 10/04/15 1220  BP: 137/62 131/79 149/70 117/81  Pulse: 98 104 97 78  Temp: 99 F (37.2 C) 99.2 F (37.3 C)  98.9 F (37.2 C)  TempSrc: Oral Oral  Oral  Resp:      Height: 5\' 7"  (1.702 m)     Weight: 157.988 kg (348 lb 4.8 oz)     SpO2: 94% 97% 95% 96%    Weight change:  Filed Weights   10/04/15 0129  Weight: 157.988 kg (348 lb 4.8 oz)   Body mass index is 54.54 kg/(m^2).   Gen Exam: Awake and alert with clear speech.   Neck: Supple, No JVD.   Chest: Bibasilar rales CVS: S1 S2 Regular, no murmurs.  Abdomen: soft, BS +, non tender, non distended.  Extremities: + edema, lower extremities warm to touch. Neurologic: Non Focal.  Skin: No Rash.   Wounds: N/A.    Intake/Output from previous day:  Intake/Output Summary (Last 24 hours) at 10/04/15 1304 Last data filed at 10/04/15 1023  Gross per  24 hour  Intake    603 ml  Output   1900 ml  Net  -1297 ml     LAB RESULTS: CBC  Recent Labs Lab 10/03/15 2100 10/03/15 2111 10/04/15 0242  WBC 9.6  --  8.9  9.4  HGB 10.9* 12.9 10.5*  10.6*  HCT 34.4* 38.0 34.1*  34.1*  PLT 227  --  233  224  MCV 79.3  --  79.9  79.5  MCH 25.1*  --  24.6*  24.7*  MCHC 31.7  --  30.8  31.1  RDW 16.6*  --  16.8*  16.9*  LYMPHSABS 1.4  --   --   MONOABS 0.5  --   --   EOSABS 0.1  --   --   BASOSABS 0.0  --   --     Chemistries   Recent Labs Lab  10/03/15 2111 10/04/15 0242 10/04/15 1037  NA 138 135 134*  K 3.7 3.6 3.6  CL 99* 98* 98*  CO2  --  25 27  GLUCOSE 285* 321* 315*  BUN 10 12 11   CREATININE 0.50 0.79  0.83 0.64  CALCIUM  --  8.8* 8.7*    CBG:  Recent Labs Lab 10/04/15 0142 10/04/15 0608 10/04/15 1158  GLUCAP 323* 215* 260*    GFR Estimated Creatinine Clearance: 137.5 mL/min (by C-G formula based on Cr of 0.64).  Coagulation profile No results for input(s): INR, PROTIME in the last 168 hours.  Cardiac Enzymes  Recent Labs Lab 10/04/15 0242 10/04/15 1037  TROPONINI <0.03 <0.03    Invalid input(s): POCBNP No results for input(s): DDIMER in the last 72 hours. No results for input(s): HGBA1C in the last 72 hours. No results for input(s): CHOL, HDL, LDLCALC, TRIG, CHOLHDL, LDLDIRECT in the last 72 hours.  Recent Labs  10/04/15 0242  TSH 5.091*   No results for input(s): VITAMINB12, FOLATE, FERRITIN, TIBC, IRON, RETICCTPCT in the last 72 hours. No results for input(s): LIPASE, AMYLASE in the last 72 hours.  Urine Studies No results for input(s): UHGB, CRYS in the last 72 hours.  Invalid input(s): UACOL, UAPR, USPG, UPH, UTP, UGL, UKET, UBIL, UNIT, UROB, ULEU, UEPI, UWBC, URBC, UBAC, CAST, UCOM, BILUA  MICROBIOLOGY: No results found for this or any previous visit (from the past 240 hour(s)).  RADIOLOGY STUDIES/RESULTS: Dg Chest 2 View  10/03/2015  CLINICAL DATA:  Productive cough, shortness of breath and left-sided chest pain for 2 weeks. Progressive symptoms. EXAM: CHEST  2 VIEW COMPARISON:  11/02/2013 FINDINGS: Mild cardiomegaly. Bilateral hilar prominence is similar to prior exam. Pulmonary edema is again seen. No confluent airspace disease. No pleural effusion. No pneumothorax. No acute osseous abnormalities. IMPRESSION: Findings consistent with CHF. Electronically Signed   By: Jeb Levering M.D.   On: 10/03/2015 20:34    Oren Binet, MD  [facility] Pager:[phone]  [phone]  If 7PM-7AM, please contact night-coverage [email or web address] Password [email or web address] 10/04/2015, 1:04 PM   LOS: 1 day

## 2015-10-04 NOTE — Care Management Note (Signed)
Case Management Note  Patient Details  Name: [name] MRN: [id number] Date of Birth: 1966/02/09  Subjective/Objective:         Admitted with CHF           Action/Plan: Patient lives at home with her family. She states that she was born her in the [place], returned to [place]. She does not exercise and states that her problem is food. " I am addicted to food," CM stated that someone from Nutrition will talk to her about making better food choices. No medical insurance, the financial counselor to see her to see what she qualifies for financial assistance. New HF, patient could benefit from a Disease Management program for CHF. HHC choice offered, patient [facility]. Mary with [facility] called for arrangements. No DME is needed at this time. Attending MD at discharge, please enter the face to face document in Epic.  Expected Discharge Date:    possibly 10/08/2015              Expected Discharge Plan:  [place]  In-House Referral:  Financial Counselor, Nutrition  Discharge planning Services  CM Consult Choice offered to:  Patient  HH Arranged:  RN, Disease Management [place] Agency:  [facility]  Status of Service:  In process, will continue to follow  Sherrilyn Rist [id number] 10/04/2015, 2:43 PM

## 2015-10-04 NOTE — Progress Notes (Signed)
Results for LAKRESHA, DIERKING (MRN [id number]) as of 10/04/2015 12:59  Ref. Range 10/04/2015 01:42 10/04/2015 06:08 10/04/2015 11:58  Glucose-Capillary Latest Ref Range: 65-99 mg/dL 323 (H) 215 (H) 260 (H)  Noted that blood sugars continue to be elevated. Recommend adding Novolog 5 units TID with meals if patient eats at least 50% of meal. Need to continue Glipizide and Novolog correction scale TID & HS. Will continue to monitor blood sugars while in the hospital. Harvel Ricks RN BSN CDE

## 2015-10-04 NOTE — Progress Notes (Signed)
  RD consulted for diet education regarding weight loss: "Patient states that she is addicted to food and needs education on making better choices".  Body mass index is 54.54 kg/(m^2). Pt meets criteria for Morbid Obesity based on current BMI.  Pt demonstrates good knowledge on healthful eating. She reports that her major concern regarding her health and her diet is related to eating large portions of macaroni and cheese, rice, and pasta. RD reviewed. Pt's dietary recall and provided tips for decreasing portions and decreasing calories. Her diet consists of a lot of fruit, vegetables, eggs, nuts/nut butters, chicken, beans, and pasta/rice.  RD provided "20 Ways to Enjoy More Fruits and Vegetables", "Healthy Snacking for Adults and Teens", and "Heart Failure Nutrition Therapy" handouts from the [employer] of [employer]. Emphasized the importance of serving sizes and provided examples of correct portions of common foods.  Provided examples of ways to balance meals/snacks and encouraged intake of fruits, vegetables, and high-fiber, whole grain complex carbohydrates.   Pt states that she really want to go to a weight loss facility/program where she can stay for a while and lose weight; she found a program in [place], but it is too expensive. RD suggested that patient meet with an outpatient dietitian who can help her establish health/diet goals and continue with follow-ups; pt likes this idea.   Current diet order is Heart healthy/Carb Modified, patient is consuming approximately 100% of meals at this time. Labs and medications reviewed. No further nutrition interventions warranted at this time. If additional nutrition issues arise, please re-consult RD.  Recommend pt referral to [facility] to meet with a Dietitian for weight loss/glucose control.   Scarlette Ar RD, LDN Inpatient Clinical Dietitian Pager: [phone] After Hours Pager: [phone]

## 2015-10-05 DIAGNOSIS — I5031 Acute diastolic (congestive) heart failure: Secondary | ICD-10-CM

## 2015-10-05 LAB — BASIC METABOLIC PANEL
Anion gap: 8 (ref 5–15)
BUN: 12 mg/dL (ref 6–20)
CALCIUM: 9 mg/dL (ref 8.9–10.3)
CHLORIDE: 104 mmol/L (ref 101–111)
CO2: 26 mmol/L (ref 22–32)
CREATININE: 0.61 mg/dL (ref 0.44–1.00)
GFR calc Af Amer: >60 mL/min (ref >60)
GFR calc non Af Amer: >60 mL/min (ref >60)
Glucose, Bld: 226 mg/dL — ABNORMAL HIGH (ref 65–99)
Potassium: 3.9 mmol/L (ref 3.5–5.1)
SODIUM: 138 mmol/L (ref 135–145)

## 2015-10-05 LAB — HEMOGLOBIN A1C
HEMOGLOBIN A1C: 11.9 % — AB (ref 4.8–5.6)
MEAN PLASMA GLUCOSE: 295 mg/dL

## 2015-10-05 LAB — GLUCOSE, CAPILLARY
GLUCOSE-CAPILLARY: 228 mg/dL — AB (ref 65–99)
GLUCOSE-CAPILLARY: 252 mg/dL — AB (ref 65–99)
GLUCOSE-CAPILLARY: 146 mg/dL — AB (ref 65–99)
Glucose-Capillary: 335 mg/dL — ABNORMAL HIGH (ref 65–99)

## 2015-10-05 LAB — TROPONIN I: Troponin I: <0.03 ng/mL (ref <0.031)

## 2015-10-05 MED ORDER — HYDROCOD POLST-CPM POLST ER 10-8 MG/5ML PO SUER
5.0000 mL | Freq: Two times a day (BID) | ORAL | Status: DC | PRN
  Administered 2015-10-05 – 2015-10-06 (×2): 5 mL via ORAL
  Filled 2015-10-05 (×2): qty 5

## 2015-10-05 MED ORDER — GLIPIZIDE 5 MG PO TABS
5.0000 mg | ORAL_TABLET | Freq: Once | ORAL | Status: AC
  Administered 2015-10-05: 5 mg via ORAL
  Filled 2015-10-05 (×2): qty 1

## 2015-10-05 MED ORDER — GLIPIZIDE ER 10 MG PO TB24
10.0000 mg | ORAL_TABLET | Freq: Every day | ORAL | Status: DC
  Administered 2015-10-06: 10 mg via ORAL
  Filled 2015-10-05 (×2): qty 1

## 2015-10-05 MED ORDER — GLIPIZIDE 2.5 MG HALF TABLET: 2.5000 mg | ORAL_TABLET | Freq: Every day | ORAL | Status: DC

## 2015-10-05 NOTE — Progress Notes (Signed)
PT Cancellation Note  Patient Details Name: [name] MRN: [id number] DOB: [birth year]   Cancelled Treatment:     Pt sitting up on the edge of bed smiling.  She states she has been walking up and down the halls on her own and does not need PT Will not perform PT eval. Please reorder if there is a change in status.   Thank you!  Donato Heinz. Owens Shark, PT  10/05/2015, 10:08 AM

## 2015-10-05 NOTE — Progress Notes (Signed)
PATIENT DETAILS Name: [name] Age: 50 y.o. Sex: female Date of Birth: 1966/07/20 Admit Date: 10/03/2015 Admitting Physician [name], MD [id number], Gabrielle Dare, MD  Subjective:  Patient sitting up in the bed, denies any fever or chills or headache, no chest pain, shortness of breath much improved, no abdominal pain. No focal weakness.  Assessment/Plan:   Acute diastolic CHF (congestive heart failure): Secondary to noncompliance to Lasix. Continue Lasix 60 mg IV twice a day. Follow weights, negative balance of 3.5 L so far.  Acute bronchitis: Complains of cough/wheezing/nasal congestion ongoing for the past 2 weeks. Start anti-sinus therapy with Claritin/Flonase/Afrin, empirically started on levofloxacin, bronchodilators. Much better. No oxygen demand.  Hypertension: Controlled-continue amlodipine, metoprolol and losartan  Pulmonary HTN: Repeat transthoracic echocardiogram-suspect is related to obesity  Bipolar disorder: She is stable-continue Risperdal, Remeron  Morbid obesity: Counseled importance of weight loss  OSA (obstructive sleep apnea)- non compliant with C-pap, counseled.  Type 2 diabetes: We'll increase her home dose Glucotrol to 10 mg daily, continue sliding scale and monitor. hold Glucophage.  Lab Results  Component Value Date   HGBA1C 11.9* 10/04/2015    CBG (last 3)   Recent Labs  10/04/15 1602 10/04/15 2048 10/05/15 0610  GLUCAP 259* 293* 228*       Disposition: Remain inpatient-Home 2-3 days  Antimicrobial agents  See below  Anti-infectives    Start     Dose/Rate Route Frequency Ordered Stop   10/04/15 1315  levofloxacin (LEVAQUIN) tablet 500 mg     500 mg Oral Daily 10/04/15 1314        DVT Prophylaxis: Prophylactic Lovenox   Code Status: Full code   Family Communication None at bedside  Procedures:  TTE  Left ventricle: The cavity size was normal. Wall thickness wasincreased in a pattern  of moderate LVH. Systolic function wasnormal. The estimated ejection fraction was in the range of 60%to 65%. Wall motion was normal; there were no regional wallmotion abnormalities. Doppler parameters are consistent withelevated ventricular end-diastolic filling pressure. - Mitral valve: Calcified annulus. Valve area by continuityequation (using LVOT flow): 2.62 cm^2. - Left atrium: The atrium was moderately dilated. - Atrial septum: No defect or patent foramen ovale was identified. - Pulmonary arteries: PA peak pressure: 51 mm Hg (S).  CONSULTS:  None  Time spent 30 minutes-Greater than 50% of this time was spent in counseling, explanation of diagnosis, planning of further management, and coordination of care.  MEDICATIONS: Scheduled Meds: . amLODipine  10 mg Oral Daily  . enoxaparin (LOVENOX) injection  80 mg Subcutaneous Q24H  . fluticasone  2 spray Each Nare Daily  . furosemide  60 mg Intravenous BID  . glipiZIDE  5 mg Oral Q breakfast  . guaiFENesin  600 mg Oral BID  . insulin aspart  0-20 Units Subcutaneous TID WC  . insulin aspart  0-5 Units Subcutaneous QHS  . insulin glargine  10 Units Subcutaneous QHS  . ipratropium-albuterol  3 mL Nebulization TID  . levofloxacin  500 mg Oral Daily  . loratadine  10 mg Oral Daily  . losartan  100 mg Oral Daily  . metoprolol succinate  50 mg Oral Daily  . mirtazapine  30 mg Oral QHS  . oxymetazoline  1 spray Each Nare BID  . potassium chloride  20 mEq Oral BID  . risperidone  4 mg Oral Daily  . sodium chloride  3 mL Intravenous Q12H  Continuous Infusions:  PRN Meds:.traMADol    PHYSICAL EXAM: Vital signs in last 24 hours: Filed Vitals:   10/04/15 2015 10/05/15 0359 10/05/15 0803 10/05/15 0954  BP:  142/81  121/67  Pulse:  75    Temp:  97.8 F (36.6 C)  97.6 F (36.4 C)  TempSrc:  Oral    Resp:  18  18  Height:      Weight:  156.128 kg (344 lb 3.2 oz)    SpO2: 95% 92% 92% 96%    Weight change: -1.86 kg (-4 lb 1.6  oz) Filed Weights   10/04/15 0129 10/05/15 0359  Weight: 157.988 kg (348 lb 4.8 oz) 156.128 kg (344 lb 3.2 oz)   Body mass index is 53.9 kg/(m^2).   Gen Exam: Awake and alert with clear speech.   Neck: Supple, No JVD.   Chest: Bibasilar rales CVS: S1 S2 Regular, no murmurs.  Abdomen: soft, BS +, non tender, non distended.  Extremities: + edema, lower extremities warm to touch. Neurologic: Non Focal.  Skin: No Rash.   Wounds: N/A.    Intake/Output from previous day:  Intake/Output Summary (Last 24 hours) at 10/05/15 1101 Last data filed at 10/05/15 0955  Gross per 24 hour  Intake   1203 ml  Output   3250 ml  Net  -2047 ml     LAB RESULTS: CBC  Recent Labs Lab 10/03/15 2100 10/03/15 2111 10/04/15 0242  WBC 9.6  --  8.9  9.4  HGB 10.9* 12.9 10.5*  10.6*  HCT 34.4* 38.0 34.1*  34.1*  PLT 227  --  233  224  MCV 79.3  --  79.9  79.5  MCH 25.1*  --  24.6*  24.7*  MCHC 31.7  --  30.8  31.1  RDW 16.6*  --  16.8*  16.9*  LYMPHSABS 1.4  --   --   MONOABS 0.5  --   --   EOSABS 0.1  --   --   BASOSABS 0.0  --   --     Chemistries   Recent Labs Lab 10/03/15 2111 10/04/15 0242 10/04/15 1037 10/05/15 0422  NA 138 135 134* 138  K 3.7 3.6 3.6 3.9  CL 99* 98* 98* 104  CO2  --  25 27 26   GLUCOSE 285* 321* 315* 226*  BUN 10 12 11 12   CREATININE 0.50 0.79  0.83 0.64 0.61  CALCIUM  --  8.8* 8.7* 9.0    CBG:  Recent Labs Lab 10/04/15 0608 10/04/15 1158 10/04/15 1602 10/04/15 2048 10/05/15 0610  GLUCAP 215* 260* 259* 293* 228*    GFR Estimated Creatinine Clearance: 136.4 mL/min (by C-G formula based on Cr of 0.61).  Coagulation profile No results for input(s): INR, PROTIME in the last 168 hours.  Cardiac Enzymes  Recent Labs Lab 10/04/15 1037 10/04/15 1638 10/05/15 0900  TROPONINI <0.03 0.11* <0.03    Invalid input(s): POCBNP No results for input(s): DDIMER in the last 72 hours.  Recent Labs  10/04/15 0242  HGBA1C 11.9*   No  results for input(s): CHOL, HDL, LDLCALC, TRIG, CHOLHDL, LDLDIRECT in the last 72 hours.  Recent Labs  10/04/15 0242  TSH 5.091*   No results for input(s): VITAMINB12, FOLATE, FERRITIN, TIBC, IRON, RETICCTPCT in the last 72 hours. No results for input(s): LIPASE, AMYLASE in the last 72 hours.  Urine Studies No results for input(s): UHGB, CRYS in the last 72 hours.  Invalid input(s): UACOL, UAPR, USPG, UPH, UTP, UGL, UKET, UBIL,  UNIT, UROB, ULEU, UEPI, UWBC, URBC, UBAC, CAST, UCOM, BILUA  MICROBIOLOGY: No results found for this or any previous visit (from the past 240 hour(s)).  RADIOLOGY STUDIES/RESULTS: Dg Chest 2 View  10/03/2015  CLINICAL DATA:  Productive cough, shortness of breath and left-sided chest pain for 2 weeks. Progressive symptoms. EXAM: CHEST  2 VIEW COMPARISON:  11/02/2013 FINDINGS: Mild cardiomegaly. Bilateral hilar prominence is similar to prior exam. Pulmonary edema is again seen. No confluent airspace disease. No pleural effusion. No pneumothorax. No acute osseous abnormalities. IMPRESSION: Findings consistent with CHF. Electronically Signed   By: Jeb Levering M.D.   On: 10/03/2015 20:34    Signature  Thurnell Lose M.D on 10/05/2015 at 11:01 AM  Between 7am to 7pm - Pager - [phone], After 7pm go to [email or web address] - password [email or web address]  [phone]   LOS: 2 days

## 2015-10-05 NOTE — Progress Notes (Signed)
Pt a/o, no c/o pain, pt had requested multiple snacks during beginning of shift, VSs, pt stable

## 2015-10-06 LAB — POTASSIUM: POTASSIUM: 3.9 mmol/L (ref 3.5–5.1)

## 2015-10-06 LAB — GLUCOSE, CAPILLARY
GLUCOSE-CAPILLARY: 331 mg/dL — AB (ref 65–99)
GLUCOSE-CAPILLARY: 276 mg/dL — AB (ref 65–99)
Glucose-Capillary: 293 mg/dL — ABNORMAL HIGH (ref 65–99)

## 2015-10-06 LAB — MAGNESIUM: Magnesium: 1.6 mg/dL — ABNORMAL LOW (ref 1.7–2.4)

## 2015-10-06 MED ORDER — GLIPIZIDE 5 MG PO TABS
5.0000 mg | ORAL_TABLET | Freq: Two times a day (BID) | ORAL | Status: DC
  Filled 2015-10-06 (×2): qty 1

## 2015-10-06 MED ORDER — MAGNESIUM SULFATE IN D5W 10-5 MG/ML-% IV SOLN
1.0000 g | Freq: Once | INTRAVENOUS | Status: AC
  Administered 2015-10-06: 1 g via INTRAVENOUS
  Filled 2015-10-06: qty 100

## 2015-10-06 MED ORDER — POTASSIUM CHLORIDE ER 20 MEQ PO TBCR: 20.0000 meq | EXTENDED_RELEASE_TABLET | Freq: Two times a day (BID) | ORAL | Status: DC

## 2015-10-06 MED ORDER — ALBUTEROL SULFATE (2.5 MG/3ML) 0.083% IN NEBU: 2.5000 mg | INHALATION_SOLUTION | Freq: Four times a day (QID) | RESPIRATORY_TRACT | Status: DC | PRN

## 2015-10-06 MED ORDER — INSULIN ASPART 100 UNIT/ML ~~LOC~~ SOLN
0.0000 [IU] | Freq: Three times a day (TID) | SUBCUTANEOUS | Status: DC
  Administered 2015-10-06: 8 [IU] via SUBCUTANEOUS

## 2015-10-06 MED ORDER — INSULIN GLARGINE 100 UNIT/ML ~~LOC~~ SOLN: 20.0000 [IU] | Freq: Every day | SUBCUTANEOUS | Status: DC

## 2015-10-06 MED ORDER — FUROSEMIDE 80 MG PO TABS: 80.0000 mg | ORAL_TABLET | Freq: Two times a day (BID) | ORAL | Status: DC

## 2015-10-06 MED ORDER — INSULIN ASPART 100 UNIT/ML ~~LOC~~ SOLN: 0.0000 [IU] | SUBCUTANEOUS | Status: DC | PRN

## 2015-10-06 MED ORDER — LEVOFLOXACIN 500 MG PO TABS: 500.0000 mg | ORAL_TABLET | Freq: Every day | ORAL | Status: DC

## 2015-10-06 NOTE — Progress Notes (Signed)
Patient is discharge to home accompanied by patient's mother and RN via wheelchair/private car. Prescriptions, discharge instructions, education given. Patient verbalizes understanding. All personal belongings given. Patient's stable. No signs and symptoms of distress noted. Telemetry box and IV removed prior to discharge.

## 2015-10-06 NOTE — Discharge Instructions (Signed)
Follow with Primary MD [name], MD in 7 days   Get CBC, CMP, 2 view Chest X ray checked  by Primary MD next visit.    Activity: As tolerated with Full fall precautions use walker/cane & assistance as needed   Disposition Home     Diet:   Heart Healthy Low Carb. Check your Weight same time everyday, if you gain over 2 pounds, or you develop in leg swelling, experience more shortness of breath or chest pain, call your Primary MD immediately. Follow Cardiac Low Salt Diet and 1.5 lit/day fluid restriction.   On your next visit with your primary care physician please Get Medicines reviewed and adjusted.   Please request your Prim.MD to go over all Hospital Tests and Procedure/Radiological results at the follow up, please get all Hospital records sent to your Prim MD by signing hospital release before you go home.   If you experience worsening of your admission symptoms, develop shortness of breath, life threatening emergency, suicidal or homicidal thoughts you must seek medical attention immediately by calling 911 or calling your MD immediately  if symptoms less severe.  You Must read complete instructions/literature along with all the possible adverse reactions/side effects for all the Medicines you take and that have been prescribed to you. Take any new Medicines after you have completely understood and accpet all the possible adverse reactions/side effects.   Do not drive, operating heavy machinery, perform activities at heights, swimming or participation in water activities or provide baby sitting services if your were admitted for syncope or siezures until you have seen by Primary MD or a Neurologist and advised to do so again.  Do not drive when taking Pain medications.    Do not take more than prescribed Pain, Sleep and Anxiety Medications  Special Instructions: If you have smoked or chewed Tobacco  in the last 2 yrs please stop smoking, stop any regular Alcohol  and or  any Recreational drug use.  Wear Seat belts while driving.   Please note  You were cared for by a hospitalist during your hospital stay. If you have any questions about your discharge medications or the care you received while you were in the hospital after you are discharged, you can call the unit and asked to speak with the hospitalist on call if the hospitalist that took care of you is not available. Once you are discharged, your primary care physician will handle any further medical issues. Please note that NO REFILLS for any discharge medications will be authorized once you are discharged, as it is imperative that you return to your primary care physician (or establish a relationship with a primary care physician if you do not have one) for your aftercare needs so that they can reassess your need for medications and monitor your lab values.

## 2015-10-06 NOTE — Care Management Note (Addendum)
Case Management Note  Patient Details  Name: Shi Winchell MRN: [id number] Date of Birth: [birth year]  Subjective/Objective:  50 y.o. F admitted 1/5/2017with CHF seen by Mindi Slicker, RNCM who arranged HH with [facility]. Will need Face to face by MD in order to finalize arrangements               Action/Plan: Anticipate discharge home today with [facility] for Disease Management of New Heart failure. No further CM needs but will be available should additional discharge needs arise.   Expected Discharge Date:                  Expected Discharge Plan:  [place]  In-House Referral:  Development worker, community, Nutrition  Discharge planning Services  CM Consult  Post Acute Care Choice:    Choice offered to:  Patient  DME Arranged:    DME Agency:     HH Arranged:  RN, Disease Management [place] Agency:  [facility]  Status of Service:  In process, will continue to follow  Medicare Important Message Given:    Date Medicare IM Given:    Medicare IM give by:    Date Additional Medicare IM Given:    Additional Medicare Important Message give by:     If discussed at [place] of Stay Meetings, dates discussed:    Additional Comments:  Delrae Sawyers, RN 10/06/2015, 9:14 AM

## 2015-10-06 NOTE — Discharge Summary (Signed)
[name], is a 50 y.o. female  DOB 1965-11-11  MRN [id number].  Admission date:  10/03/2015  Admitting Physician  [name], MD  Discharge Date:  10/06/2015   Primary MD  [name], MD  Recommendations for primary care physician for things to follow:   Check weight, BMP and diuretic dose within a week. Outpatient cardiology follow-up.  Strict counseling on fluid and salt restriction which she is noncompliant with.   Admission Diagnosis  Acute on chronic congestive heart failure, unspecified congestive heart failure type ([place]) [I50.9]   Discharge Diagnosis  Acute on chronic congestive heart failure, unspecified congestive heart failure type ([place]) [I50.9]    Principal Problem:   Acute diastolic CHF (congestive heart failure) ([place]) Active Problems:   Morbid obesity (HCC)   OSA (obstructive sleep apnea)- non compliant with C-pap   Chronic diastolic congestive heart failure (HCC)   Pulmonary HTN, moderate to severe   Essential hypertension, benign   Bipolar 1 disorder, mixed, moderate (HCC)      Past Medical History  Diagnosis Date  . Insulin dependent type 2 diabetes mellitus, uncontrolled ([place])   . Chronic diastolic CHF (congestive heart failure) ([place])   . Morbid obesity with BMI of 50.0-59.9, adult ([place])   . Bipolar disease, chronic ([place])   . History of thyrotoxicosis   . OSA (obstructive sleep apnea) 03/06/2011  . Chest pain     a. 2012 Myoview: EF 63%, no isch/infarct  . Chronic diastolic congestive heart failure ([place]) 07/23/2011  . Hypertensive heart disease 10/18/2013    History reviewed. No pertinent past surgical history.     HPI  from the history and physical done on the day of admission:   [name] is a 50 y.o. female with a past medical history significant for  NIDDM, Bipolar I, chronic diastolic CHF, OSA not on CPAP, and HTN who presents with dyspnea.  About three weeks ago, the patient started to develop cough that was persistent and sometimes productive, without fever. In the last few weeks, she notes that she and her family were celebrating, and so she hasn't been taking her furosemide because she didn't want to pee all the time so she could enjoy herself. She also notes that she thought the cough was a URI, so she tried the other day to drink a liter of hot tea, which helped her cough.   Now in the last day, the patient feels "like I have a blanket over my face". She has also had some chest pressure the other day, as well as today a sharp, burning left chest pain. Her orthopnea and leg swelling are actually stable.  In the ED, the paitent was afebrile and hypertensive. She had hyperglycemia and normal renal function, and a TNI was negative. A chest x-ray showed pulmonary edema. She was administered nitropaste and furosemide and TRH were asked to admit for CHF.     Hospital Course:   Acute diastolic CHF (congestive heart failure) EF 60% on echogram: Secondary to noncompliance with, fluid  and salt restriction along with Lasix. She was diuresed with Lasix 60 mg IV twice a day. Is down 4-1/2 L of fluid, oxygen free, no edema, no shortness of breath, she will be transitioned to 80 mg of twice a day oral Lasix along with potassium supplementation. Been counseled on fluid and salt restriction, home RN ordered, outpatient cardiology follow-up requested. Patient confirms that she drinks between 3-3-1/2 L of fluid every day. Counseled on fluid and salt restriction by me personally and written instructions provided.  Note patient persistently saying she wants to stay in the hospital for 4-5 days as she feels her life is more organized in the hospital than at home, denies any domestic abuse, she was told that medically she is clear for discharge. Case  management requested to assist the patient as much as we can with her domestic problems.   Acute bronchitis: Complains of cough/wheezing/nasal congestion ongoing for the past 2 weeks. Start anti-sinus therapy with Claritin/Flonase/Afrin, empirically started on levofloxacin, bronchodilators. Much better. No oxygen demand. We will give her Levaquin for 3 more days along with a nebulizer machine and treatments as needed.   Hypertension: Controlled-continue amlodipine, metoprolol and losartan  Pulmonary HTN: Repeat transthoracic echocardiogram-suspect is related to obesity, outpatient cardiology and PCP follow-up.  Bipolar disorder: She is stable-continue Risperdal, Remeron  Morbid obesity: Counseled importance of weight loss  OSA (obstructive sleep apnea)- non compliant with C-pap, counseled. I'll await PCP and pulmonary as needed.  Type 2 diabetes: The new home regimen of Glucotrol along with Glucophage and follow with PCP.     Discharge Condition: Stable  Follow UP  Follow-up Information    Follow up with [facility].   Specialty:  Home Health Services   Why:  A home health care nurse will come to your home   Contact information:   [place] [address] [phone]       Follow up with [name], MD. Schedule an appointment as soon as possible for a visit in 1 week.   Specialty:  Internal Medicine   Contact information:   [place] [address] [phone]       Follow up with [facility], MD. Schedule an appointment as soon as possible for a visit in 1 week.   Specialty:  Cardiology   Why:  CHF   Contact information:   [place] [address] [phone]        Consults obtained - None  Diet and Activity recommendation: See Discharge Instructions below  Discharge Instructions       Discharge Instructions    Discharge instructions    Complete by:  As directed     Follow with Primary MD [name], MD in 7 days   Get CBC, CMP, 2 view Chest X ray checked  by Primary MD next visit.    Activity: As tolerated with Full fall precautions use walker/cane & assistance as needed   Disposition Home     Diet:   Heart Healthy Low Carb. Check your Weight same time everyday, if you gain over 2 pounds, or you develop in leg swelling, experience more shortness of breath or chest pain, call your Primary MD immediately. Follow Cardiac Low Salt Diet and 1.5 lit/day fluid restriction.   On your next visit with your primary care physician please Get Medicines reviewed and adjusted.   Please request your Prim.MD to go over all Hospital Tests and  Procedure/Radiological results at the follow up, please get all Hospital records sent to your Prim MD by signing hospital release before you go home.   If you experience worsening of your admission symptoms, develop shortness of breath, life threatening emergency, suicidal or homicidal thoughts you must seek medical attention immediately by calling 911 or calling your MD immediately  if symptoms less severe.  You Must read complete instructions/literature along with all the possible adverse reactions/side effects for all the Medicines you take and that have been prescribed to you. Take any new Medicines after you have completely understood and accpet all the possible adverse reactions/side effects.   Do not drive, operating heavy machinery, perform activities at heights, swimming or participation in water activities or provide baby sitting services if your were admitted for syncope or siezures until you have seen by Primary MD or a Neurologist and advised to do so again.  Do not drive when taking Pain medications.    Do not take more than prescribed Pain, Sleep and Anxiety Medications  Special Instructions: If you have smoked or chewed Tobacco  in the last 2 yrs please stop smoking, stop any regular Alcohol  and or  any Recreational drug use.  Wear Seat belts while driving.   Please note  You were cared for by a hospitalist during your hospital stay. If you have any questions about your discharge medications or the care you received while you were in the hospital after you are discharged, you can call the unit and asked to speak with the hospitalist on call if the hospitalist that took care of you is not available. Once you are discharged, your primary care physician will handle any further medical issues. Please note that NO REFILLS for any discharge medications will be authorized once you are discharged, as it is imperative that you return to your primary care physician (or establish a relationship with a primary care physician if you do not have one) for your aftercare needs so that they can reassess your need for medications and monitor your lab values.     Increase activity slowly    Complete by:  As directed              Discharge Medications       Medication List    TAKE these medications        albuterol (2.5 MG/3ML) 0.083% nebulizer solution  Commonly known as:  PROVENTIL  Take 3 mLs (2.5 mg total) by nebulization every 6 (six) hours as needed for wheezing or shortness of breath.     amLODipine 10 MG tablet  Commonly known as:  NORVASC  Take 1 tablet (10 mg total) by mouth daily.     clotrimazole 1 % cream  Commonly known as:  LOTRIMIN  Apply to affected area 2 times daily     fluconazole 150 MG tablet  Commonly known as:  DIFLUCAN  Take 1 tablet (150 mg total) by mouth once. Repeat in 1 week     fluticasone 50 MCG/ACT nasal spray  Commonly known as:  FLONASE  Place 2 sprays into both nostrils daily.     furosemide 80 MG tablet  Commonly known as:  LASIX  Take 1 tablet (80 mg total) by mouth 2 (two) times daily.     glipiZIDE 5 MG 24 hr tablet  Commonly known as:  GLUCOTROL XL  Take 1 tablet (5 mg total) by mouth daily with breakfast.     glucose blood test strip  Commonly known as:  TRUE METRIX BLOOD GLUCOSE TEST  Use as instructed     levofloxacin 500 MG tablet  Commonly known as:  LEVAQUIN  Take 1 tablet (500 mg total) by mouth daily.     LORazepam 1 MG tablet  Commonly known as:  ATIVAN  Take 1 tablet (1 mg total) by mouth at bedtime.     losartan 100 MG tablet  Commonly known as:  COZAAR  Take 1 tablet (100 mg total) by mouth daily.     metFORMIN 1000 MG tablet  Commonly known as:  GLUCOPHAGE  Take 1 tablet (1,000 mg total) by mouth 2 (two) times daily with a meal.     metoprolol succinate 50 MG 24 hr tablet  Commonly known as:  TOPROL-XL  Take 1 tablet (50 mg total) by mouth daily. Take with or immediately following a meal.     mirtazapine 30 MG tablet  Commonly known as:  REMERON  Take 1 tablet (30 mg total) by mouth at bedtime.     Potassium Chloride ER 20 MEQ Tbcr  Take 20 mEq by mouth 2 (two) times daily.     risperidone 4 MG tablet  Commonly known as:  RISPERDAL  Take 1 tablet (4 mg total) by mouth daily.     rosuvastatin 10 MG tablet  Commonly known as:  CRESTOR  Take 1 tablet (10 mg total) by mouth daily.     terconazole 80 MG vaginal suppository  Commonly known as:  TERAZOL 3  Place 1 suppository (80 mg total) vaginally at bedtime.     traMADol 50 MG tablet  Commonly known as:  ULTRAM  Take 1 tablet (50 mg total) by mouth every 12 (twelve) hours as needed.     TRUE METRIX METER w/Device Kit  uad     TRUEPLUS LANCETS 28G Misc  USE TO TEST BLOOD SUGARS AS DIRECTED BY PHYSICIAN        Major procedures and Radiology Reports - PLEASE review detailed and final reports for all details, in brief -    TTE  Left ventricle: The cavity size was normal. Wall thickness wasincreased in a pattern of moderate LVH. Systolic function wasnormal. The estimated ejection fraction was in the range of 60%to 65%. Wall motion was normal; there were no regional wallmotion abnormalities. Doppler parameters are consistent  withelevated ventricular end-diastolic filling pressure. - Mitral valve: Calcified annulus. Valve area by continuityequation (using LVOT flow): 2.62 cm^2. - Left atrium: The atrium was moderately dilated. - Atrial septum: No defect or patent foramen ovale was identified. - Pulmonary arteries: PA peak pressure: 51 mm Hg (S).   Dg Chest 2 View  10/03/2015  CLINICAL DATA:  Productive cough, shortness of breath and left-sided chest pain for 2 weeks. Progressive symptoms. EXAM: CHEST  2 VIEW COMPARISON:  11/02/2013 FINDINGS: Mild cardiomegaly. Bilateral hilar prominence is similar to prior exam. Pulmonary edema is again seen. No confluent airspace disease. No pleural effusion. No pneumothorax. No acute osseous abnormalities. IMPRESSION: Findings consistent with CHF. Electronically Signed   By: Jeb Levering M.D.   On: 10/03/2015 20:34    Micro Results      No results found for this or any previous visit (from the past 240 hour(s)).     Today   Subjective    [name] today has no headache,no chest abdominal pain,no new weakness tingling or numbness, feels much better.   Objective   Blood pressure 124/56, pulse 72, temperature 98 F (36.7 C), temperature  source Oral, resp. rate 20, height '5\' 7"'  (1.702 m), weight 158.804 kg (350 lb 1.6 oz), SpO2 94 %.   Intake/Output Summary (Last 24 hours) at 10/06/15 1018 Last data filed at 10/06/15 0900  Gross per 24 hour  Intake   1800 ml  Output   2500 ml  Net   -700 ml    Exam Awake Alert, Oriented x 3, No new F.N deficits, Normal affect [place].AT,PERRAL Supple Neck,No JVD, No cervical lymphadenopathy appriciated.  Symmetrical Chest wall movement, Good air movement bilaterally, CTAB RRR,No Gallops,Rubs or new Murmurs, No Parasternal Heave +ve B.Sounds, Abd Soft, Non tender, No organomegaly appriciated, No rebound -guarding or rigidity. No Cyanosis, Clubbing or edema, No new Rash or bruise   Data Review   CBC w Diff: Lab Results    Component Value Date   WBC 8.9 10/04/2015   WBC 9.4 10/04/2015   HGB 10.5* 10/04/2015   HGB 10.6* 10/04/2015   HCT 34.1* 10/04/2015   HCT 34.1* 10/04/2015   PLT 233 10/04/2015   PLT 224 10/04/2015   LYMPHOPCT 14 10/03/2015   MONOPCT 5 10/03/2015   EOSPCT 1 10/03/2015   BASOPCT 0 10/03/2015    CMP: Lab Results  Component Value Date   NA 138 10/05/2015   K 3.9 10/06/2015   CL 104 10/05/2015   CO2 26 10/05/2015   BUN 12 10/05/2015   CREATININE 0.61 10/05/2015   CREATININE 0.69 03/07/2015   PROT 7.4 12/03/2014   ALBUMIN 3.9 12/03/2014   BILITOT 0.4 12/03/2014   ALKPHOS 119* 12/03/2014   AST 23 12/03/2014   ALT 11 12/03/2014  .    Total Time in preparing paper work, data evaluation and todays exam - 35 minutes  Thurnell Lose M.D on 10/06/2015 at 10:18 AM  [facility]   Office  [phone]

## 2015-10-07 LAB — HEMOGLOBIN A1C
Hgb A1c MFr Bld: 12.1 % — ABNORMAL HIGH (ref 4.8–5.6)
Mean Plasma Glucose: 301 mg/dL

## 2015-10-10 ENCOUNTER — Ambulatory Visit: Payer: No Typology Code available for payment source | Attending: Internal Medicine | Admitting: Physician Assistant

## 2015-10-10 VITALS — BP 151/82 | HR 90 | Temp 97.5°F | Resp 16 | Ht 67.0 in | Wt 348.0 lb

## 2015-10-10 DIAGNOSIS — E1165 Type 2 diabetes mellitus with hyperglycemia: Secondary | ICD-10-CM | POA: Insufficient documentation

## 2015-10-10 DIAGNOSIS — Z79899 Other long term (current) drug therapy: Secondary | ICD-10-CM | POA: Insufficient documentation

## 2015-10-10 DIAGNOSIS — J208 Acute bronchitis due to other specified organisms: Secondary | ICD-10-CM

## 2015-10-10 DIAGNOSIS — I5032 Chronic diastolic (congestive) heart failure: Secondary | ICD-10-CM | POA: Insufficient documentation

## 2015-10-10 DIAGNOSIS — J209 Acute bronchitis, unspecified: Secondary | ICD-10-CM | POA: Insufficient documentation

## 2015-10-10 DIAGNOSIS — F319 Bipolar disorder, unspecified: Secondary | ICD-10-CM | POA: Insufficient documentation

## 2015-10-10 DIAGNOSIS — Z6841 Body Mass Index (BMI) 40.0 and over, adult: Secondary | ICD-10-CM | POA: Insufficient documentation

## 2015-10-10 DIAGNOSIS — Z7984 Long term (current) use of oral hypoglycemic drugs: Secondary | ICD-10-CM | POA: Insufficient documentation

## 2015-10-10 DIAGNOSIS — Z888 Allergy status to other drugs, medicaments and biological substances status: Secondary | ICD-10-CM | POA: Insufficient documentation

## 2015-10-10 DIAGNOSIS — G4733 Obstructive sleep apnea (adult) (pediatric): Secondary | ICD-10-CM | POA: Insufficient documentation

## 2015-10-10 DIAGNOSIS — R079 Chest pain, unspecified: Secondary | ICD-10-CM | POA: Insufficient documentation

## 2015-10-10 DIAGNOSIS — Z794 Long term (current) use of insulin: Secondary | ICD-10-CM | POA: Insufficient documentation

## 2015-10-10 MED ORDER — ALBUTEROL SULFATE HFA 108 (90 BASE) MCG/ACT IN AERS
2.0000 | INHALATION_SPRAY | Freq: Four times a day (QID) | RESPIRATORY_TRACT | Status: DC | PRN
Start: 2015-10-10 — End: 2015-10-16

## 2015-10-10 MED ORDER — LEVOFLOXACIN 500 MG PO TABS: 500.0000 mg | ORAL_TABLET | Freq: Every day | ORAL | Status: DC

## 2015-10-10 MED ORDER — HYDROCODONE-HOMATROPINE 5-1.5 MG/5ML PO SYRP: 5.0000 mL | ORAL_SOLUTION | Freq: Three times a day (TID) | ORAL | Status: DC | PRN

## 2015-10-10 MED FILL — levoFLOXacin 500 MG TABS: 500 | 7 days supply | Qty: 7 | Fill #0

## 2015-10-10 MED FILL — VENTOLIN HFA 90 MCG INHALER: 108 (90 BAS | 25 days supply | Qty: 18 | Fill #0

## 2015-10-10 NOTE — Patient Instructions (Signed)
Take antibiotic daily for a week Take albuterol breathing treatments 3-4 times per day Stop the excess fluid Take all medications as prescribed Cough syrup 3-4 times per day Call back on Monday if not better

## 2015-10-10 NOTE — Progress Notes (Signed)
[name]  RWE:[id number]  YPP:[id number]  DOB - 12-20-1965  Chief Complaint  Patient presents with  . Chest Pain  . Cough  . Fever       Subjective:   [name] is a 50 y.o. female here today for  Hospital follow-up.  She was hospitalized in January 5th through 10/06/2015. She initially presented with 2 weeks of cough, fever, and chest pain. She had greenish sputum production initially. She also had accompanying shortness of breath. In the emergency department her troponin was negative. Her EKG was nonrevealing. A chest x-ray was consistent with congestive heart failure. She was admitted by the medicine team and diuresed 4 L.  Her echo showed normal LV function.She admitted to not being compliant with her home medication regimen, including her diuretic therapy or fluid restrictions . She also was started on Levaquin and completed 2 days and was sent home with a prescription. She also was sent home with nebulization treatments. She got neither of the prescriptions filled.  Today she stated that she is no better. Her cough persists. She's having difficulty sleeping secondary to cough. She still short of breath. No fevers. No chills. Appetite is ok.   ROS: GEN: denies fever or chills, denies change in weight Skin: denies lesions or rashes HEENT: denies headache, earache, epistaxis, sore throat, or neck pain LUNGS: +SHOB, +dyspnea, PND, orthopnea CV: denies CP or palpitations EXT: denies muscle spasms or swelling; no pain in lower ext, no weakness   ALLERGIES: Allergies  Allergen Reactions  . Acetaminophen     Fits as a child "seizures-like"  . Caffeine     Tense, anxiety, increased urination  . Lisinopril     Rash with lisinopril; but fosinopril is ok per patient    PAST MEDICAL HISTORY: Past Medical History  Diagnosis Date  . Insulin dependent type 2 diabetes mellitus, uncontrolled ([place])   . Chronic diastolic CHF (congestive heart failure) ([place])   . Morbid  obesity with BMI of 50.0-59.9, adult ([place])   . Bipolar disease, chronic ([place])   . History of thyrotoxicosis   . OSA (obstructive sleep apnea) 03/06/2011  . Chest pain     a. 2012 Myoview: EF 63%, no isch/infarct  . Chronic diastolic congestive heart failure ([place]) 07/23/2011  . Hypertensive heart disease 10/18/2013    PAST SURGICAL HISTORY: History reviewed. No pertinent past surgical history.  MEDICATIONS AT HOME: Prior to Admission medications   Medication Sig Start Date End Date Taking? Authorizing Provider  amLODipine (NORVASC) 10 MG tablet Take 1 tablet (10 mg total) by mouth daily. 12/03/14  Yes Lance Bosch, NP  Blood Glucose Monitoring Suppl (TRUE METRIX METER) W/DEVICE KIT uad 03/04/15  Yes Tresa Garter, MD  furosemide (LASIX) 80 MG tablet Take 1 tablet (80 mg total) by mouth 2 (two) times daily. 10/06/15  Yes Thurnell Lose, MD  glipiZIDE (GLUCOTROL XL) 5 MG 24 hr tablet Take 1 tablet (5 mg total) by mouth daily with breakfast. 06/17/15  Yes Tresa Garter, MD  glucose blood (TRUE METRIX BLOOD GLUCOSE TEST) test strip Use as instructed 03/04/15  Yes Tresa Garter, MD  levofloxacin (LEVAQUIN) 500 MG tablet Take 1 tablet (500 mg total) by mouth daily. 10/10/15  Yes Demario Faniel Daneil Dan, PA-C  losartan (COZAAR) 100 MG tablet Take 1 tablet (100 mg total) by mouth daily. 08/07/15  Yes Tresa Garter, MD  metFORMIN (GLUCOPHAGE) 1000 MG tablet Take 1 tablet (1,000 mg total) by mouth 2 (two) times  daily with a meal. 12/03/14  Yes Lance Bosch, NP  metoprolol succinate (TOPROL-XL) 50 MG 24 hr tablet Take 1 tablet (50 mg total) by mouth daily. Take with or immediately following a meal. 11/28/13  Yes Olugbemiga E Doreene Burke, MD  mirtazapine (REMERON) 30 MG tablet Take 1 tablet (30 mg total) by mouth at bedtime. 03/07/15  Yes Tresa Garter, MD  potassium chloride 20 MEQ TBCR Take 20 mEq by mouth 2 (two) times daily. 10/06/15  Yes Thurnell Lose, MD  risperidone (RISPERDAL) 4 MG tablet  Take 1 tablet (4 mg total) by mouth daily. 08/07/15  Yes Tresa Garter, MD  TRUEPLUS LANCETS 28G MISC USE TO TEST BLOOD SUGARS AS DIRECTED BY PHYSICIAN 07/05/15  Yes Olugbemiga E Doreene Burke, MD  albuterol (PROVENTIL HFA;VENTOLIN HFA) 108 (90 Base) MCG/ACT inhaler Inhale 2 puffs into the lungs every 6 (six) hours as needed for wheezing or shortness of breath. 10/10/15   Catera Hankins Daneil Dan, PA-C  clotrimazole (LOTRIMIN) 1 % cream Apply to affected area 2 times daily Patient not taking: Reported on 10/03/2015 07/22/15   Billy Fischer, MD  fluconazole (DIFLUCAN) 150 MG tablet Take 1 tablet (150 mg total) by mouth once. Repeat in 1 week Patient not taking: Reported on 10/03/2015 07/22/15   Billy Fischer, MD  fluticasone [facility]) 50 MCG/ACT nasal spray Place 2 sprays into both nostrils daily. Patient not taking: Reported on 10/03/2015 01/02/15   Arnoldo Morale, MD  HYDROcodone-homatropine (HYCODAN) 5-1.5 MG/5ML syrup Take 5 mLs by mouth every 8 (eight) hours as needed for cough. 10/10/15   Shaylon Gillean Daneil Dan, PA-C  LORazepam (ATIVAN) 1 MG tablet Take 1 tablet (1 mg total) by mouth at bedtime. Patient not taking: Reported on 10/03/2015 07/02/14   Tresa Garter, MD  rosuvastatin (CRESTOR) 10 MG tablet Take 1 tablet (10 mg total) by mouth daily. Patient not taking: Reported on 10/03/2015 06/01/13   Ripudeep Krystal Eaton, MD  terconazole (TERAZOL 3) 80 MG vaginal suppository Place 1 suppository (80 mg total) vaginally at bedtime. Patient not taking: Reported on 10/03/2015 07/22/15   Billy Fischer, MD  traMADol (ULTRAM) 50 MG tablet Take 1 tablet (50 mg total) by mouth every 12 (twelve) hours as needed. Patient not taking: Reported on 10/10/2015 09/10/14   Tresa Garter, MD     Objective:   Filed Vitals:   10/10/15 0924  BP: 151/82  Pulse: 90  Temp: 97.5 F (36.4 C)  TempSrc: Oral  Resp: 16  Height: '5\' 7"'  (1.702 m)  Weight: 348 lb (157.852 kg)  SpO2: 92%    Exam General appearance : Awake, alert, not in any  distress. Speech Clear. Not toxic looking HEENT: Atraumatic and Normocephalic, pupils equally reactive to light and accomodation Neck: supple, no JVD. No cervical lymphadenopathy.  Chest:Good air entry bilaterally, no added sounds -no wheezing or crackles CVS: S1 S2 regular, no murmurs.  Extremities: B/L Lower Ext shows no edema, both legs are warm to touch   Data Review Lab Results  Component Value Date   HGBA1C 12.1* 10/05/2015   HGBA1C 11.9* 10/04/2015   HGBA1C 10.6 03/07/2015     Assessment & Plan  1. Acute Bronchitis  -Levaquin 500 mg for a week  -Albuterol inhaler/Hycodan cough syrup  -offered 5 day course of Prednisone but she declined  -told to call back on Monday if no improvement of sxs 2. Acute on chronic dHF exacerbation-resolved  -encouraged compliance with CV regimen   -Fluid restriction/weight checks discussed  Return in about 2 weeks (around 10/24/2015).  The patient was given clear instructions to go to ER or return to medical center if symptoms don't improve, worsen or new problems develop. The patient verbalized understanding.  27 minutes was spent discussing diagnosis and treatment plan with her and her mom.  This note has been created with Surveyor, quantity. Any transcriptional errors are unintentional.    Zettie Pho, PA-C [facility] [place], [place]   10/10/2015, 10:03 AM

## 2015-10-10 NOTE — Progress Notes (Signed)
Patient's here for chest pain, cough, fever and SOB.  Patient states she haven't slept for the last 2 days.   Patient having pain over entire body, Pain on right leg outer thigh area. Rates pain 9/10.  Patient requesting refill of Glipizide.

## 2015-10-16 ENCOUNTER — Other Ambulatory Visit: Payer: Self-pay | Admitting: Internal Medicine

## 2015-10-16 MED ORDER — ALBUTEROL SULFATE HFA 108 (90 BASE) MCG/ACT IN AERS: 2.0000 | INHALATION_SPRAY | Freq: Four times a day (QID) | RESPIRATORY_TRACT | Status: DC | PRN

## 2015-10-30 ENCOUNTER — Other Ambulatory Visit: Payer: Self-pay | Admitting: Internal Medicine

## 2015-10-30 ENCOUNTER — Telehealth: Payer: Self-pay

## 2015-10-30 MED FILL — LOSARTAN POTASSIUM 100 MG T: 100 | 30 days supply | Qty: 30 | Fill #1

## 2015-10-30 MED FILL — ?AMLODIPINE BESYLATE 10 MG: 10 | 30 days supply | Qty: 30 | Fill #6

## 2015-10-30 MED FILL — VENTOLIN HFA 90 MCG INHALER: 108 (90 BAS | 28 days supply | Qty: 18 | Fill #0

## 2015-10-30 MED FILL — glipiZIDE XL 5 MG TB24: 5 | 30 days supply | Qty: 30 | Fill #2

## 2015-10-30 NOTE — Telephone Encounter (Signed)
CMA called patient, patient did not answer. Patient was first called on cell # [phone]. The person that answered, stated that this wasn't the person I'm looking for and immediately hung up. CMA then called the home [phone]. A message was left for the patient to call me asap.  Note to patient:  Please inform patient that her rx for tramadol is available for pickup. Let patient know that she needs to stop by the front desk first to sign for rx and then she can take it to the Pharmacy.

## 2015-11-04 ENCOUNTER — Ambulatory Visit: Payer: No Typology Code available for payment source | Attending: Internal Medicine | Admitting: Internal Medicine

## 2015-11-04 ENCOUNTER — Emergency Department (HOSPITAL_COMMUNITY)
Admission: EM | Admit: 2015-11-04 | Discharge: 2015-11-04 | Disposition: A | Discharge status: Home / Self Care | Payer: No Typology Code available for payment source | Attending: Emergency Medicine | Admitting: Emergency Medicine

## 2015-11-04 ENCOUNTER — Emergency Department (HOSPITAL_COMMUNITY): Payer: No Typology Code available for payment source

## 2015-11-04 ENCOUNTER — Encounter (HOSPITAL_COMMUNITY): Payer: Self-pay

## 2015-11-04 ENCOUNTER — Encounter: Payer: Self-pay | Admitting: Internal Medicine

## 2015-11-04 VITALS — BP 165/67 | HR 95 | Temp 97.7°F | Resp 24 | Ht 67.0 in | Wt 350.0 lb

## 2015-11-04 DIAGNOSIS — Z8669 Personal history of other diseases of the nervous system and sense organs: Secondary | ICD-10-CM | POA: Insufficient documentation

## 2015-11-04 DIAGNOSIS — Z7984 Long term (current) use of oral hypoglycemic drugs: Secondary | ICD-10-CM | POA: Insufficient documentation

## 2015-11-04 DIAGNOSIS — E119 Type 2 diabetes mellitus without complications: Secondary | ICD-10-CM | POA: Insufficient documentation

## 2015-11-04 DIAGNOSIS — Z888 Allergy status to other drugs, medicaments and biological substances status: Secondary | ICD-10-CM | POA: Insufficient documentation

## 2015-11-04 DIAGNOSIS — I5032 Chronic diastolic (congestive) heart failure: Secondary | ICD-10-CM | POA: Insufficient documentation

## 2015-11-04 DIAGNOSIS — E669 Obesity, unspecified: Secondary | ICD-10-CM | POA: Insufficient documentation

## 2015-11-04 DIAGNOSIS — Z79899 Other long term (current) drug therapy: Secondary | ICD-10-CM | POA: Insufficient documentation

## 2015-11-04 DIAGNOSIS — Z794 Long term (current) use of insulin: Secondary | ICD-10-CM | POA: Insufficient documentation

## 2015-11-04 DIAGNOSIS — E1165 Type 2 diabetes mellitus with hyperglycemia: Secondary | ICD-10-CM | POA: Insufficient documentation

## 2015-11-04 DIAGNOSIS — Z6841 Body Mass Index (BMI) 40.0 and over, adult: Secondary | ICD-10-CM | POA: Insufficient documentation

## 2015-11-04 DIAGNOSIS — I110 Hypertensive heart disease with heart failure: Secondary | ICD-10-CM | POA: Insufficient documentation

## 2015-11-04 DIAGNOSIS — J40 Bronchitis, not specified as acute or chronic: Secondary | ICD-10-CM | POA: Insufficient documentation

## 2015-11-04 DIAGNOSIS — F319 Bipolar disorder, unspecified: Secondary | ICD-10-CM | POA: Insufficient documentation

## 2015-11-04 DIAGNOSIS — I119 Hypertensive heart disease without heart failure: Secondary | ICD-10-CM | POA: Insufficient documentation

## 2015-11-04 DIAGNOSIS — R079 Chest pain, unspecified: Secondary | ICD-10-CM | POA: Insufficient documentation

## 2015-11-04 DIAGNOSIS — Z7951 Long term (current) use of inhaled steroids: Secondary | ICD-10-CM | POA: Insufficient documentation

## 2015-11-04 DIAGNOSIS — Z8701 Personal history of pneumonia (recurrent): Secondary | ICD-10-CM | POA: Insufficient documentation

## 2015-11-04 DIAGNOSIS — G4733 Obstructive sleep apnea (adult) (pediatric): Secondary | ICD-10-CM | POA: Insufficient documentation

## 2015-11-04 DIAGNOSIS — R05 Cough: Secondary | ICD-10-CM | POA: Insufficient documentation

## 2015-11-04 DIAGNOSIS — I5031 Acute diastolic (congestive) heart failure: Secondary | ICD-10-CM | POA: Insufficient documentation

## 2015-11-04 LAB — BASIC METABOLIC PANEL
ANION GAP: 13 (ref 5–15)
BUN: 9 mg/dL (ref 6–20)
CALCIUM: 9.2 mg/dL (ref 8.9–10.3)
CO2: 29 mmol/L (ref 22–32)
Chloride: 100 mmol/L — ABNORMAL LOW (ref 101–111)
Creatinine, Ser: 0.71 mg/dL (ref 0.44–1.00)
GFR calc Af Amer: >60 mL/min (ref >60)
GFR calc non Af Amer: >60 mL/min (ref >60)
GLUCOSE: 275 mg/dL — AB (ref 65–99)
Potassium: 3.4 mmol/L — ABNORMAL LOW (ref 3.5–5.1)
Sodium: 142 mmol/L (ref 135–145)

## 2015-11-04 LAB — CBC
HEMATOCRIT: 34.9 % — AB (ref 36.0–46.0)
HEMOGLOBIN: 11.1 g/dL — AB (ref 12.0–15.0)
MCH: 26.7 pg (ref 26.0–34.0)
MCHC: 31.8 g/dL (ref 30.0–36.0)
MCV: 84.1 fL (ref 78.0–100.0)
PLATELETS: 208 10*3/uL (ref 150–400)
RBC: 4.15 MIL/uL (ref 3.87–5.11)
RDW: 16 % — AB (ref 11.5–15.5)
WBC: 5.8 10*3/uL (ref 4.0–10.5)

## 2015-11-04 LAB — BRAIN NATRIURETIC PEPTIDE: B Natriuretic Peptide: 164.2 pg/mL — ABNORMAL HIGH (ref 0.0–100.0)

## 2015-11-04 LAB — TROPONIN I: Troponin I: <0.03 ng/mL (ref <0.031)

## 2015-11-04 LAB — CBG MONITORING, ED: GLUCOSE-CAPILLARY: 193 mg/dL — AB (ref 65–99)

## 2015-11-04 MED ORDER — ALBUTEROL SULFATE (2.5 MG/3ML) 0.083% IN NEBU
2.5000 mg | INHALATION_SOLUTION | Freq: Once | RESPIRATORY_TRACT | Status: AC
Start: 2015-11-04 — End: 2015-11-04
  Administered 2015-11-04: 2.5 mg via RESPIRATORY_TRACT

## 2015-11-04 MED ORDER — ALBUTEROL SULFATE HFA 108 (90 BASE) MCG/ACT IN AERS: 1.0000 | INHALATION_SPRAY | Freq: Four times a day (QID) | RESPIRATORY_TRACT | Status: DC | PRN

## 2015-11-04 MED ORDER — FUROSEMIDE 10 MG/ML IJ SOLN
40.0000 mg | Freq: Once | INTRAMUSCULAR | Status: AC
  Administered 2015-11-04: 40 mg via INTRAVENOUS
  Filled 2015-11-04: qty 4

## 2015-11-04 MED ORDER — ALBUTEROL SULFATE (2.5 MG/3ML) 0.083% IN NEBU
5.0000 mg | INHALATION_SOLUTION | Freq: Once | RESPIRATORY_TRACT | Status: AC
  Administered 2015-11-04: 5 mg via RESPIRATORY_TRACT
  Filled 2015-11-04: qty 6

## 2015-11-04 MED ORDER — GUAIFENESIN-CODEINE 100-10 MG/5ML PO SOLN: 10.0000 mL | Freq: Four times a day (QID) | ORAL | Status: DC | PRN

## 2015-11-04 MED FILL — GUAIFENESIN AC COUGH SYRUP: 100-10 | 8 days supply | Qty: 480 | Fill #0

## 2015-11-04 NOTE — Progress Notes (Signed)
Patient ID: [name], female   DOB: Feb 19, 1966, 50 y.o.   MRN: [id number]   [name], is a 50 y.o. female  KDX:[id number]  [id number]  DOB - 1966-09-26  Chief Complaint  Patient presents with  . Cough  . Chest Pain        Subjective:   [name] is a 50 y.o. female with extensive medical history including insulin-dependent type 2 diabetes mellitus, uncontrolled, chronic diastolic congestive heart failure, morbid obesity, bipolar disease, obstructive sleep apnea and hypertensive heart disease here today for a follow up visit. Patient is complaining of cough and chest pain that started few weeks ago, for which she was seen and admitted to the hospital in January 2017, although she has been through 2 rounds of antibiotics, cough persists and now she is short of breath and extremely fatigued. She claims she has not slept for more than 2 days because of excessive cough and she is very weak and tired. Blood sugar has not been controlled because of her current illness. She denies any fever. She has no leg swelling. She is on high dose of Lasix and she claims compliant. She will like to be sent to the hospital ED for further evaluation and management  No problems updated.  ALLERGIES: Allergies  Allergen Reactions  . Acetaminophen     Fits as a child "seizures-like"  . Caffeine     Tense, anxiety, increased urination  . Lisinopril     Rash with lisinopril; but fosinopril is ok per patient    PAST MEDICAL HISTORY: Past Medical History  Diagnosis Date  . Insulin dependent type 2 diabetes mellitus, uncontrolled ([place])   . Chronic diastolic CHF (congestive heart failure) ([place])   . Morbid obesity with BMI of 50.0-59.9, adult ([place])   . Bipolar disease, chronic ([place])   . History of thyrotoxicosis   . OSA (obstructive sleep apnea) 03/06/2011  . Chest pain     a. 2012 Myoview: EF 63%, no isch/infarct  . Chronic diastolic congestive heart failure ([place]) 07/23/2011  . Hypertensive  heart disease 10/18/2013    MEDICATIONS AT HOME: Prior to Admission medications   Medication Sig Start Date End Date Taking? Authorizing Provider  albuterol (PROVENTIL HFA;VENTOLIN HFA) 108 (90 Base) MCG/ACT inhaler Inhale 2 puffs into the lungs every 6 (six) hours as needed for wheezing or shortness of breath. 10/16/15  Yes Tresa Garter, MD  amLODipine (NORVASC) 10 MG tablet Take 1 tablet (10 mg total) by mouth daily. 12/03/14  Yes Lance Bosch, NP  Blood Glucose Monitoring Suppl (TRUE METRIX METER) W/DEVICE KIT uad 03/04/15  Yes Tresa Garter, MD  fluticasone (FLONASE) 50 MCG/ACT nasal spray Place 2 sprays into both nostrils daily. 01/02/15  Yes Arnoldo Morale, MD  furosemide (LASIX) 80 MG tablet Take 1 tablet (80 mg total) by mouth 2 (two) times daily. 10/06/15  Yes Thurnell Lose, MD  glipiZIDE (GLUCOTROL XL) 5 MG 24 hr tablet Take 1 tablet (5 mg total) by mouth daily with breakfast. 06/17/15  Yes Tresa Garter, MD  glucose blood (TRUE METRIX BLOOD GLUCOSE TEST) test strip Use as instructed 03/04/15  Yes Abrahm Mancia E Doreene Burke, MD  HYDROcodone-homatropine (HYCODAN) 5-1.5 MG/5ML syrup Take 5 mLs by mouth every 8 (eight) hours as needed for cough. 10/10/15  Yes [name] Daneil Dan, PA-C  losartan (COZAAR) 100 MG tablet Take 1 tablet (100 mg total) by mouth daily. 08/07/15  Yes Tresa Garter, MD  metFORMIN (GLUCOPHAGE) 1000 MG tablet Take 1  tablet (1,000 mg total) by mouth 2 (two) times daily with a meal. 12/03/14  Yes Lance Bosch, NP  metoprolol succinate (TOPROL-XL) 50 MG 24 hr tablet Take 1 tablet (50 mg total) by mouth daily. Take with or immediately following a meal. 11/28/13  Yes Diania Co E Doreene Burke, MD  mirtazapine (REMERON) 30 MG tablet Take 1 tablet (30 mg total) by mouth at bedtime. 03/07/15  Yes Tresa Garter, MD  potassium chloride 20 MEQ TBCR Take 20 mEq by mouth 2 (two) times daily. 10/06/15  Yes Thurnell Lose, MD  risperidone (RISPERDAL) 4 MG tablet Take 1 tablet (4 mg  total) by mouth daily. 08/07/15  Yes Tresa Garter, MD  rosuvastatin (CRESTOR) 10 MG tablet Take 1 tablet (10 mg total) by mouth daily. 06/01/13  Yes Ripudeep Krystal Eaton, MD  terconazole (TERAZOL 3) 80 MG vaginal suppository Place 1 suppository (80 mg total) vaginally at bedtime. 07/22/15  Yes Billy Fischer, MD  traMADol (ULTRAM) 50 MG tablet TAKE ONE TABLET EVERY 12 HOURS AS NEEDED 10/30/15  Yes Tresa Garter, MD  TRUEPLUS LANCETS 28G MISC USE TO TEST BLOOD SUGARS AS DIRECTED BY PHYSICIAN 07/05/15  Yes Tresa Garter, MD  clotrimazole (LOTRIMIN) 1 % cream Apply to affected area 2 times daily Patient not taking: Reported on 11/04/2015 07/22/15   Billy Fischer, MD  fluconazole (DIFLUCAN) 150 MG tablet Take 1 tablet (150 mg total) by mouth once. Repeat in 1 week Patient not taking: Reported on 11/04/2015 07/22/15   Billy Fischer, MD  levofloxacin (LEVAQUIN) 500 MG tablet Take 1 tablet (500 mg total) by mouth daily. Patient not taking: Reported on 11/04/2015 10/10/15   Brayton Caves, PA-C  LORazepam (ATIVAN) 1 MG tablet Take 1 tablet (1 mg total) by mouth at bedtime. Patient not taking: Reported on 11/04/2015 07/02/14   Tresa Garter, MD     Objective:   Filed Vitals:   11/04/15 1005  BP: 165/67  Pulse: 95  Temp: 97.7 F (36.5 C)  TempSrc: Oral  Resp: 24  Height: _0  (1.702 m)  Weight: 350 lb (158.759 kg)  SpO2: 95%    Exam General appearance : Awake, alert, mild resp distress. Short of breath, morbidly obese HEENT: Atraumatic and Normocephalic, pupils equally reactive to light and accomodation Neck: supple, no JVD. No cervical lymphadenopathy.  Chest: Good air entry bilaterally but fine crackles posteriorly at the lung bases CVS: S1 S2 regular, no murmurs.  Abdomen: Bowel sounds present, Non tender and not distended with no gaurding, rigidity or rebound. Extremities: B/L Lower Ext shows 1+ edema, both legs are warm to touch Neurology: Awake alert, and oriented X 3, CN II-XII  intact, Non focal  Data Review Lab Results  Component Value Date   HGBA1C 12.1* 10/05/2015   HGBA1C 11.9* 10/04/2015   HGBA1C 10.6 03/07/2015     Assessment & Plan   1. Type 2 diabetes mellitus without complication, without long-term current use of insulin (HCC)  - Glucose (CBG) - Microalbumin/Creatinine Ratio, Urine  2. Acute diastolic congestive heart failure (HCC)  - albuterol (PROVENTIL) (2.5 MG/3ML) 0.083% nebulizer solution 2.5 mg; Take 3 mLs (2.5 mg total) by nebulization once. - Patient refused IV lasix, wants to be transported to the ED, she said, I just want to sleep for 2 hours, I've not slept in 2 days.  - Patient's mother also wanted her sent to the ED for management - Patient has been placed on oxygen, saturating 95% on  2L  3. OSA Will arrange for sleep study  Patient will benefit from CPAP use   Patient have been counseled extensively about nutrition and exercise  Return if symptoms worsen or fail to improve, for Heart Failure and Hypertension, OSA sleep study, Hemoglobin A1C and Follow up, DM.  The patient was given clear instructions to go to ER or return to medical center if symptoms don't improve, worsen or new problems develop. The patient verbalized understanding. The patient was told to call to get lab results if they haven't heard anything in the next week.   This note has been created with Surveyor, quantity. Any transcriptional errors are unintentional.    [name], MD, [place], Karilyn Cota, [place] and [facility] [place], [place]   11/04/2015, 10:31 AM

## 2015-11-04 NOTE — Patient Instructions (Signed)
Hypertension Hypertension, commonly called high blood pressure, is when the force of blood pumping through your arteries is too strong. Your arteries are the blood vessels that carry blood from your heart throughout your body. A blood pressure reading consists of a higher number over a lower number, such as 110/72. The higher number (systolic) is the pressure inside your arteries when your heart pumps. The lower number (diastolic) is the pressure inside your arteries when your heart relaxes. Ideally you want your blood pressure below 120/80. Hypertension forces your heart to work harder to pump blood. Your arteries may become narrow or stiff. Having untreated or uncontrolled hypertension can cause heart attack, stroke, kidney disease, and other problems. RISK FACTORS Some risk factors for high blood pressure are controllable. Others are not.  Risk factors you cannot control include:   Race. You may be at higher risk if you are African American.  Age. Risk increases with age.  Gender. Men are at higher risk than women before age 45 years. After age 65, women are at higher risk than men. Risk factors you can control include:  Not getting enough exercise or physical activity.  Being overweight.  Getting too much fat, sugar, calories, or salt in your diet.  Drinking too much alcohol. SIGNS AND SYMPTOMS Hypertension does not usually cause signs or symptoms. Extremely high blood pressure (hypertensive crisis) may cause headache, anxiety, shortness of breath, and nosebleed. DIAGNOSIS To check if you have hypertension, your health care provider will measure your blood pressure while you are seated, with your arm held at the level of your heart. It should be measured at least twice using the same arm. Certain conditions can cause a difference in blood pressure between your right and left arms. A blood pressure reading that is higher than normal on one occasion does not mean that you need treatment. If  it is not clear whether you have high blood pressure, you may be asked to return on a different day to have your blood pressure checked again. Or, you may be asked to monitor your blood pressure at home for 1 or more weeks. TREATMENT Treating high blood pressure includes making lifestyle changes and possibly taking medicine. Living a healthy lifestyle can help lower high blood pressure. You may need to change some of your habits. Lifestyle changes may include:  Following the DASH diet. This diet is high in fruits, vegetables, and whole grains. It is low in salt, red meat, and added sugars.  Keep your sodium intake below 2,300 mg per day.  Getting at least 30-45 minutes of aerobic exercise at least 4 times per week.  Losing weight if necessary.  Not smoking.  Limiting alcoholic beverages.  Learning ways to reduce stress. Your health care provider may prescribe medicine if lifestyle changes are not enough to get your blood pressure under control, and if one of the following is true:  You are 18-59 years of age and your systolic blood pressure is above 140.  You are 60 years of age or older, and your systolic blood pressure is above 150.  Your diastolic blood pressure is above 90.  You have diabetes, and your systolic blood pressure is over 140 or your diastolic blood pressure is over 90.  You have kidney disease and your blood pressure is above 140/90.  You have heart disease and your blood pressure is above 140/90. Your personal target blood pressure may vary depending on your medical conditions, your age, and other factors. HOME CARE INSTRUCTIONS    Have your blood pressure rechecked as directed by your health care provider.   °· Take medicines only as directed by your health care provider. Follow the directions carefully. Blood pressure medicines must be taken as prescribed. The medicine does not work as well when you skip doses. Skipping doses also puts you at risk for  problems. °· Do not smoke.   °· Monitor your blood pressure at home as directed by your health care provider.  °SEEK MEDICAL CARE IF:  °· You think you are having a reaction to medicines taken. °· You have recurrent headaches or feel dizzy. °· You have swelling in your ankles. °· You have trouble with your vision. °SEEK IMMEDIATE MEDICAL CARE IF: °· You develop a severe headache or confusion. °· You have unusual weakness, numbness, or feel faint. °· You have severe chest or abdominal pain. °· You vomit repeatedly. °· You have trouble breathing. °MAKE SURE YOU:  °· Understand these instructions. °· Will watch your condition. °· Will get help right away if you are not doing well or get worse. °  °This information is not intended to replace advice given to you by your health care provider. Make sure you discuss any questions you have with your health care provider. °  °Document Released: 09/14/2005 Document Revised: 01/29/2015 Document Reviewed: 07/07/2013 °Elsevier Interactive Patient Education ©2016 Else[employer] Inc. ° ° °Heart Failure °Heart failure is a condition in which the heart has trouble pumping blood. This means your heart does not pump blood efficiently for your body to work well. In some cases of heart failure, fluid may back up into your lungs or you may have swelling (edema) in your lower legs. Heart failure is usually a long-term (chronic) condition. It is important for you to take good care of yourself and follow your health care provider's treatment plan. °CAUSES  °Some health conditions can cause heart failure. Those health conditions include: °· High blood pressure (hypertension). Hypertension causes the heart muscle to work harder than normal. When pressure in the blood vessels is high, the heart needs to pump (contract) with more force in order to circulate blood throughout the body. High blood pressure eventually causes the heart to become stiff and weak. °· Coronary artery disease (CAD). CAD is the  buildup of cholesterol and fat (plaque) in the arteries of the heart. The blockage in the arteries deprives the heart muscle of oxygen and blood. This can cause chest pain and may lead to a heart attack. High blood pressure can also contribute to CAD. °· Heart attack (myocardial infarction). A heart attack occurs when one or more arteries in the heart become blocked. The loss of oxygen damages the muscle tissue of the heart. When this happens, part of the heart muscle dies. The injured tissue does not contract as well and weakens the heart's ability to pump blood. °· Abnormal heart valves. When the heart valves do not open and close properly, it can cause heart failure. This makes the heart muscle pump harder to keep the blood flowing. °· Heart muscle disease (cardiomyopathy or myocarditis). Heart muscle disease is damage to the heart muscle from a variety of causes. These can include drug or alcohol abuse, infections, or unknown reasons. These can increase the risk of heart failure. °· Lung disease. Lung disease makes the heart work harder because the lungs do not work properly. This can cause a strain on the heart, leading it to fail. °· Diabetes. Diabetes increases the risk of heart failure. High blood sugar contributes to   high fat (lipid) levels in the blood. Diabetes can also cause slow damage to tiny blood vessels that carry important nutrients to the heart muscle. When the heart does not get enough oxygen and food, it can cause the heart to become weak and stiff. This leads to a heart that does not contract efficiently. °· Other conditions can contribute to heart failure. These include abnormal heart rhythms, thyroid problems, and low blood counts (anemia). °Certain unhealthy behaviors can increase the risk of heart failure, including: °· Being overweight. °· Smoking or chewing tobacco. °· Eating foods high in fat and cholesterol. °· Abusing illicit drugs or alcohol. °· Lacking physical activity. °SYMPTOMS   °Heart failure symptoms may vary and can be hard to detect. Symptoms may include: °· Shortness of breath with activity, such as climbing stairs. °· Persistent cough. °· Swelling of the feet, ankles, legs, or abdomen. °· Unexplained weight gain. °· Difficulty breathing when lying flat (orthopnea). °· Waking from sleep because of the need to sit up and get more air. °· Rapid heartbeat. °· Fatigue and loss of energy. °· Feeling light-headed, dizzy, or close to fainting. °· Loss of appetite. °· Nausea. °· Increased urination during the night (nocturia). °DIAGNOSIS  °A diagnosis of heart failure is based on your history, symptoms, physical examination, and diagnostic tests. Diagnostic tests for heart failure may include: °· Echocardiography. °· Electrocardiography. °· Chest X-ray. °· Blood tests. °· Exercise stress test. °· Cardiac angiography. °· Radionuclide scans. °TREATMENT  °Treatment is aimed at managing the symptoms of heart failure. Medicines, behavioral changes, or surgical intervention may be necessary to treat heart failure. °· Medicines to help treat heart failure may include: °¨ Angiotensin-converting enzyme (ACE) inhibitors. This type of medicine blocks the effects of a blood protein called angiotensin-converting enzyme. ACE inhibitors relax (dilate) the blood vessels and help lower blood pressure. °¨ Angiotensin receptor blockers (ARBs). This type of medicine blocks the actions of a blood protein called angiotensin. Angiotensin receptor blockers dilate the blood vessels and help lower blood pressure. °¨ Water pills (diuretics). Diuretics cause the kidneys to remove salt and water from the blood. The extra fluid is removed through urination. This loss of extra fluid lowers the volume of blood the heart pumps. °¨ Beta blockers. These prevent the heart from beating too fast and improve heart muscle strength. °¨ Digitalis. This increases the force of the heartbeat. °· Healthy behavior changes  include: °¨ Obtaining and maintaining a healthy weight. °¨ Stopping smoking or chewing tobacco. °¨ Eating heart-healthy foods. °¨ Limiting or avoiding alcohol. °¨ Stopping illicit drug use. °¨ Physical activity as directed by your health care provider. °· Surgical treatment for heart failure may include: °¨ A procedure to open blocked arteries, repair damaged heart valves, or remove damaged heart muscle tissue. °¨ A pacemaker to improve heart muscle function and control certain abnormal heart rhythms. °¨ An internal cardioverter defibrillator to treat certain serious abnormal heart rhythms. °¨ A left ventricular assist device (LVAD) to assist the pumping ability of the heart. °HOME CARE INSTRUCTIONS  °· Take medicines only as directed by your health care provider. Medicines are important in reducing the workload of your heart, slowing the progression of heart failure, and improving your symptoms. °¨ Do not stop taking your medicine unless directed by your health care provider. °¨ Do not skip any dose of medicine. °¨ Refill your prescriptions before you run out of medicine. Your medicines are needed every day. °· Engage in moderate physical activity if directed by your   health care provider. Moderate physical activity can benefit some people. The elderly and people with severe heart failure should consult with a health care provider for physical activity recommendations. °· Eat heart-healthy foods. Food choices should be free of trans fat and low in saturated fat, cholesterol, and salt (sodium). Healthy choices include fresh or frozen fruits and vegetables, fish, lean meats, legumes, fat-free or low-fat dairy products, and whole grain or high fiber foods. Talk to a dietitian to learn more about heart-healthy foods. °· Limit sodium if directed by your health care provider. Sodium restriction may reduce symptoms of heart failure in some people. Talk to a dietitian to learn more about heart-healthy seasonings. °· Use  healthy cooking methods. Healthy cooking methods include roasting, grilling, broiling, baking, poaching, steaming, or stir-frying. Talk to a dietitian to learn more about healthy cooking methods. °· Limit fluids if directed by your health care provider. Fluid restriction may reduce symptoms of heart failure in some people. °· Weigh yourself every day. Daily weights are important in the early recognition of excess fluid. You should weigh yourself every morning after you urinate and before you eat breakfast. Wear the same amount of clothing each time you weigh yourself. Record your daily weight. Provide your health care provider with your weight record. °· Monitor and record your blood pressure if directed by your health care provider. °· Check your pulse if directed by your health care provider. °· Lose weight if directed by your health care provider. Weight loss may reduce symptoms of heart failure in some people. °· Stop smoking or chewing tobacco. Nicotine makes your heart work harder by causing your blood vessels to constrict. Do not use nicotine gum or patches before talking to your health care provider. °· Keep all follow-up visits as directed by your health care provider. This is important. °· Limit alcohol intake to no more than 1 drink per day for nonpregnant women and 2 drinks per day for men. One drink equals 12 ounces of beer, 5 ounces of wine, or 1½ ounces of hard liquor. Drinking more than that is harmful to your heart. Tell your health care provider if you drink alcohol several times a week. Talk with your health care provider about whether alcohol is safe for you. If your heart has already been damaged by alcohol or you have severe heart failure, drinking alcohol should be stopped completely. °· Stop illicit drug use. °· Stay up-to-date with immunizations. It is especially important to prevent respiratory infections through current pneumococcal and influenza immunizations. °· Manage other health  conditions such as hypertension, diabetes, thyroid disease, or abnormal heart rhythms as directed by your health care provider. °· Learn to manage stress. °· Plan rest periods when fatigued. °· Learn strategies to manage high temperatures. If the weather is extremely hot: °¨ Avoid vigorous physical activity. °¨ Use air conditioning or fans or seek a cooler location. °¨ Avoid caffeine and alcohol. °¨ Wear loose-fitting, lightweight, and light-colored clothing. °· Learn strategies to manage cold temperatures. If the weather is extremely cold: °¨ Avoid vigorous physical activity. °¨ Layer clothes. °¨ Wear mittens or gloves, a hat, and a scarf when going outside. °¨ Avoid alcohol. °· Obtain ongoing education and support as needed. °· Participate in or seek rehabilitation as needed to maintain or improve independence and quality of life. °SEEK MEDICAL CARE IF:  °· You have a rapid weight gain. °· You have increasing shortness of breath that is unusual for you. °· You are unable to participate   in your usual physical activities. °· You tire easily. °· You cough more than normal, especially with physical activity. °· You have any or more swelling in areas such as your hands, feet, ankles, or abdomen. °· You are unable to sleep because it is hard to breathe. °· You feel like your heart is beating fast (palpitations). °· You become dizzy or light-headed upon standing up. °SEEK IMMEDIATE MEDICAL CARE IF:  °· You have difficulty breathing. °· There is a change in mental status such as decreased alertness or difficulty with concentration. °· You have a pain or discomfort in your chest. °· You have an episode of fainting (syncope). °MAKE SURE YOU:  °· Understand these instructions. °· Will watch your condition. °· Will get help right away if you are not doing well or get worse. °  °This information is not intended to replace advice given to you by your health care provider. Make sure you discuss any questions you have with your  health care provider. °  °Document Released: 09/14/2005 Document Revised: 01/29/2015 Document Reviewed: 10/14/2012 °Elsevier Interactive Patient Education ©2016 [employer] Inc. ° °

## 2015-11-04 NOTE — Progress Notes (Signed)
Patient is here for Cough and Chest Pain  Patient complains of chest discomfort being present described as stabbing pains being scaled currently at a 6.  Patient has taken her medications and patient has eaten.  Patient tolerated breathing treatment well. Patient stated she :felt better" after treatment and O2 was provided.

## 2015-11-04 NOTE — ED Provider Notes (Signed)
CSN: [id number]     Arrival date & time 11/04/15  1111 History   First MD Initiated Contact with Patient 11/04/15 1212     Chief Complaint  Patient presents with  . Shortness of Breath   (Consider location/radiation/quality/duration/timing/severity/associated sxs/prior Treatment) HPI 50 y.o. female with a hx of CHF, presents to the Emergency Department today complaining of worsening shortness of breath and cough x 4 months. States that past two days she has been having trouble breathing at night. Associated Cough with productive sputum at a white color. No N/V/D. No fevers. No abd pain. Hospitalized recentyl for Pneumonia and CHF Exacerbation. She saw her PCP today who recommended she go to the ER for evaluation. No other symptoms today.    Past Medical History  Diagnosis Date  . Insulin dependent type 2 diabetes mellitus, uncontrolled ([place])   . Chronic diastolic CHF (congestive heart failure) ([place])   . Morbid obesity with BMI of 50.0-59.9, adult ([place])   . Bipolar disease, chronic ([place])   . History of thyrotoxicosis   . OSA (obstructive sleep apnea) 03/06/2011  . Chest pain     a. 2012 Myoview: EF 63%, no isch/infarct  . Chronic diastolic congestive heart failure ([place]) 07/23/2011  . Hypertensive heart disease 10/18/2013   History reviewed. No pertinent past surgical history. Family History  Problem Relation Age of Onset  . Heart failure Father   . Stroke Father   . Heart disease Maternal Grandfather   . Heart disease Paternal Grandfather   . Hypertension Mother    Social History  Substance Use Topics  . Smoking status: Never Smoker   . Smokeless tobacco: Never Used  . Alcohol Use: No   OB History    No data available     Review of Systems ROS reviewed and all are negative for acute change except as noted in the HPI.  Allergies  Acetaminophen; Caffeine; and Lisinopril  Home Medications   Prior to Admission medications   Medication Sig Start Date End Date Taking?  Authorizing Provider  albuterol (PROVENTIL HFA;VENTOLIN HFA) 108 (90 Base) MCG/ACT inhaler Inhale 2 puffs into the lungs every 6 (six) hours as needed for wheezing or shortness of breath. 10/16/15  Yes Tresa Garter, MD  amLODipine (NORVASC) 10 MG tablet Take 1 tablet (10 mg total) by mouth daily. 12/03/14  Yes Lance Bosch, NP  fluconazole (DIFLUCAN) 150 MG tablet Take 1 tablet (150 mg total) by mouth once. Repeat in 1 week 07/22/15  Yes Billy Fischer, MD  fluticasone [facility]) 50 MCG/ACT nasal spray Place 2 sprays into both nostrils daily. 01/02/15  Yes Arnoldo Morale, MD  furosemide (LASIX) 80 MG tablet Take 1 tablet (80 mg total) by mouth 2 (two) times daily. 10/06/15  Yes Thurnell Lose, MD  glipiZIDE (GLUCOTROL XL) 5 MG 24 hr tablet Take 1 tablet (5 mg total) by mouth daily with breakfast. 06/17/15  Yes Olugbemiga E Doreene Burke, MD  LORazepam (ATIVAN) 1 MG tablet Take 1 tablet (1 mg total) by mouth at bedtime. 07/02/14  Yes Tresa Garter, MD  losartan (COZAAR) 100 MG tablet Take 1 tablet (100 mg total) by mouth daily. 08/07/15  Yes Tresa Garter, MD  metFORMIN (GLUCOPHAGE) 1000 MG tablet Take 1 tablet (1,000 mg total) by mouth 2 (two) times daily with a meal. 12/03/14  Yes Lance Bosch, NP  metoprolol succinate (TOPROL-XL) 50 MG 24 hr tablet Take 1 tablet (50 mg total) by mouth daily. Take with or immediately following  a meal. 11/28/13  Yes Tresa Garter, MD  mirtazapine (REMERON) 30 MG tablet Take 1 tablet (30 mg total) by mouth at bedtime. 03/07/15  Yes Tresa Garter, MD  potassium chloride 20 MEQ TBCR Take 20 mEq by mouth 2 (two) times daily. 10/06/15  Yes Thurnell Lose, MD  Blood Glucose Monitoring Suppl (TRUE METRIX METER) W/DEVICE KIT uad Patient not taking: Reported on 11/04/2015 03/04/15   Tresa Garter, MD  clotrimazole (LOTRIMIN) 1 % cream Apply to affected area 2 times daily Patient not taking: Reported on 11/04/2015 07/22/15   Billy Fischer, MD   HYDROcodone-homatropine [facility]) 5-1.5 MG/5ML syrup Take 5 mLs by mouth every 8 (eight) hours as needed for cough. 10/10/15   [name] Daneil Dan, PA-C  levofloxacin (LEVAQUIN) 500 MG tablet Take 1 tablet (500 mg total) by mouth daily. Patient not taking: Reported on 11/04/2015 10/10/15   Brayton Caves, PA-C  risperidone (RISPERDAL) 4 MG tablet Take 1 tablet (4 mg total) by mouth daily. 08/07/15   Tresa Garter, MD  rosuvastatin (CRESTOR) 10 MG tablet Take 1 tablet (10 mg total) by mouth daily. Patient not taking: Reported on 11/04/2015 06/01/13   Ripudeep Krystal Eaton, MD  terconazole (TERAZOL 3) 80 MG vaginal suppository Place 1 suppository (80 mg total) vaginally at bedtime. 07/22/15   Billy Fischer, MD  traMADol (ULTRAM) 50 MG tablet TAKE ONE TABLET EVERY 12 HOURS AS NEEDED Patient not taking: Reported on 11/04/2015 10/30/15   Tresa Garter, MD  TRUEPLUS LANCETS 28G MISC USE TO TEST BLOOD SUGARS AS DIRECTED BY PHYSICIAN Patient not taking: Reported on 11/04/2015 07/05/15   Tresa Garter, MD   BP 179/105 mmHg  Pulse 96  Temp(Src) 98.9 F (37.2 C) (Oral)  Resp 25  Ht '5\' 7"'  (1.702 m)  Wt 158.759 kg  BMI 54.80 kg/m2  SpO2 96%  LMP 09/24/2015 Physical Exam  Constitutional: She is oriented to person, place, and time. She appears well-developed and well-nourished.  HENT:  Head: Normocephalic and atraumatic.  Eyes: EOM are normal.  Neck: Normal range of motion. Neck supple.  Cardiovascular: Normal rate, regular rhythm and normal heart sounds.   Pulmonary/Chest: Effort normal. No respiratory distress. She has wheezes in the right upper field and the left upper field. She has rales in the right lower field and the left lower field.  Abdominal: Soft. Bowel sounds are normal. There is no tenderness.  Musculoskeletal: Normal range of motion.  Neurological: She is alert and oriented to person, place, and time.  Skin: Skin is warm and dry.  Psychiatric: She has a normal mood and affect. Her behavior  is normal. Thought content normal.  Nursing note and vitals reviewed.  ED Course  Procedures (including critical care time) Labs Review Labs Reviewed  CBC - Abnormal; Notable for the following:    Hemoglobin 11.1 (*)    HCT 34.9 (*)    RDW 16.0 (*)    All other components within normal limits  BASIC METABOLIC PANEL - Abnormal; Notable for the following:    Potassium 3.4 (*)    Chloride 100 (*)    Glucose, Bld 275 (*)    All other components within normal limits  BRAIN NATRIURETIC PEPTIDE - Abnormal; Notable for the following:    B Natriuretic Peptide 164.2 (*)    All other components within normal limits  CBG MONITORING, ED - Abnormal; Notable for the following:    Glucose-Capillary 193 (*)    All other components  within normal limits  TROPONIN I   Imaging Review Dg Chest 2 View  11/04/2015  CLINICAL DATA:  Cough for several months. EXAM: CHEST  2 VIEW COMPARISON:  10/03/2015 FINDINGS: Peribronchial thickening and diffuse interstitial prominence, stable since prior study in similar to study dating back to 2015, most likely chronic interstitial lung disease. No confluent opacities or effusions. Heart is upper limits normal in size. No acute bony abnormality. IMPRESSION: Peribronchial thickening and diffuse interstitial prominence, similar to prior studies, likely related to chronic interstitial lung disease. Electronically Signed   By: Rolm Baptise M.D.   On: 11/04/2015 12:28   I have personally reviewed and evaluated these images and lab results as part of my medical decision-making.   EKG Interpretation   Date/Time:  Monday November 04 2015 11:23:05 EST Ventricular Rate:  92 PR Interval:  168 QRS Duration: 82 QT Interval:  360 QTC Calculation: 445 R Axis:   96 Text Interpretation:  Sinus rhythm Probable left atrial enlargement  Anterior infarct, old No significant change since last tracing Confirmed  by [facility]  MD, MARTHA [phone]) on 11/04/2015 11:44:57 AM      MDM  I  have reviewed relevant laboratory values.I have reviewed relevant imaging studies.I personally interpreted the relevant EKG.I have reviewed the relevant previous healthcare records.I obtained HPI from historian. Patient discussed with supervising physician  ED Course:  Assessment: 42y F with hx CHF presents from PCP office for worsening SOB x 4 months. SOB worse the past 2 days. On exam, patient had diffuse wheezing and rales. Afebrile. Pt in NAD. VSS. Given 56m Lasix IV. Normally takes 820mLasix PO BID. Initial CXR showed peribronchial thickening. Labs showed BNP of 164. Trop neg. Most likely bronchitis causing symptoms. Will dc with follow up to PCP for further management. Patient is in no acute distress. Vital Signs are stable. Patient is able to ambulate. Patient able to tolerate PO.    Disposition/Plan:  DC Home Additional Verbal discharge instructions given and discussed with patient.  Pt Instructed to f/u with PCP in the next 48 hours for evaluation and treatment of symptoms. Return precautions given Pt acknowledges and agrees with plan  Supervising Physician MaAlfonzo BeersMD   Final diagnoses:  Bronchitis      TyShary DecampPA-C 11/04/15 14Russian MissionMD 11/04/15 1501

## 2015-11-04 NOTE — ED Notes (Signed)
EDP at bedside  

## 2015-11-04 NOTE — ED Notes (Signed)
Pt. Coming from [facility] with c/o SOB starting yesterday. Pt. Recently treated for pneumonia and finished her ABX last week. Pt. Hospitalized here last month for right-sided heart failure. Pt. Dyspnea at rest and with movement. Pt. Strong cough with yellow sputum noted. Pt. EKG unremarkable. Pt. Given 2.5 [facility]erol tx [facility] Pt. Describes heaviness in her chest. Pt. Also c/o abdominal edema.

## 2015-11-04 NOTE — Discharge Instructions (Signed)
Please read and follow all provided instructions.  Your diagnoses today include:  1. Bronchitis     Tests performed today include:  Chest x-ray - does not show any pneumonia  Vital signs. See below for your results today.   Medications prescribed:   Take any prescribed medications only as directed.  Home care instructions:  Follow any educational materials contained in this packet.  Follow-up instructions: Please follow-up with your primary care provider in the next 3 days for further evaluation of your symptoms and a recheck if you are not feeling better.   Return instructions:   Please return to the Emergency Department if you experience worsening symptoms.  Please return with worsening wheezing, shortness of breath, or difficulty breathing.  Return with persistent fever above 101F.   Please return if you have any other emergent concerns.  Additional Information:  Your vital signs today were: BP 117/73 mmHg   Pulse 91   Temp(Src) 98.9 F (37.2 C) (Oral)   Resp 18   Ht 5\' 7"  (1.702 m)   Wt 158.759 kg   BMI 54.80 kg/m2   SpO2 91%   LMP 09/24/2015 If your blood pressure (BP) was elevated above 135/85 this visit, please have this repeated by your doctor within one month. --------------

## 2015-11-05 ENCOUNTER — Telehealth: Payer: Self-pay | Admitting: Internal Medicine

## 2015-11-05 NOTE — Telephone Encounter (Signed)
Patient verified DOB Medical Assistant iterated instructions from 11/04/15 visit. Patient expressed her understanding and had no further questions.

## 2015-11-05 NOTE — Telephone Encounter (Signed)
Patient came in to speak with nurse.  Patient said she was contacted and instructed to get in contact with the nurse Please follow up.   [phone]

## 2015-11-05 NOTE — Telephone Encounter (Signed)
Patient was unable to be reached. Person on phone stated MA had the wrong number after two attempts. Medical Assistant had not reached out to the patient. Though MA was returning a phone to patients message.

## 2015-11-05 NOTE — Telephone Encounter (Signed)
Patient called states is returning nurse's call , please f/u

## 2015-11-11 ENCOUNTER — Encounter (HOSPITAL_COMMUNITY): Payer: Self-pay | Admitting: *Deleted

## 2015-11-11 ENCOUNTER — Other Ambulatory Visit: Payer: Self-pay

## 2015-11-11 ENCOUNTER — Inpatient Hospital Stay (HOSPITAL_COMMUNITY)
Admission: EM | Admit: 2015-11-11 | Discharge: 2015-11-21 | DRG: 292 | Disposition: A | Discharge status: Home Health | Payer: Self-pay | Attending: Internal Medicine | Admitting: Internal Medicine

## 2015-11-11 ENCOUNTER — Ambulatory Visit (HOSPITAL_COMMUNITY)
Admission: RE | Admit: 2015-11-11 | Discharge: 2015-11-11 | Disposition: A | Discharge status: Home / Self Care | Payer: Self-pay | Source: Ambulatory Visit | Attending: Internal Medicine | Admitting: Internal Medicine

## 2015-11-11 DIAGNOSIS — R05 Cough: Secondary | ICD-10-CM | POA: Insufficient documentation

## 2015-11-11 DIAGNOSIS — I5033 Acute on chronic diastolic (congestive) heart failure: Secondary | ICD-10-CM | POA: Diagnosis present

## 2015-11-11 DIAGNOSIS — Z7984 Long term (current) use of oral hypoglycemic drugs: Secondary | ICD-10-CM

## 2015-11-11 DIAGNOSIS — Z79899 Other long term (current) drug therapy: Secondary | ICD-10-CM

## 2015-11-11 DIAGNOSIS — Z886 Allergy status to analgesic agent status: Secondary | ICD-10-CM

## 2015-11-11 DIAGNOSIS — K219 Gastro-esophageal reflux disease without esophagitis: Secondary | ICD-10-CM | POA: Diagnosis present

## 2015-11-11 DIAGNOSIS — Z823 Family history of stroke: Secondary | ICD-10-CM

## 2015-11-11 DIAGNOSIS — F3162 Bipolar disorder, current episode mixed, moderate: Secondary | ICD-10-CM | POA: Diagnosis present

## 2015-11-11 DIAGNOSIS — R509 Fever, unspecified: Secondary | ICD-10-CM | POA: Insufficient documentation

## 2015-11-11 DIAGNOSIS — E119 Type 2 diabetes mellitus without complications: Secondary | ICD-10-CM

## 2015-11-11 DIAGNOSIS — R0789 Other chest pain: Secondary | ICD-10-CM

## 2015-11-11 DIAGNOSIS — Z8249 Family history of ischemic heart disease and other diseases of the circulatory system: Secondary | ICD-10-CM

## 2015-11-11 DIAGNOSIS — E0810 Diabetes mellitus due to underlying condition with ketoacidosis without coma: Secondary | ICD-10-CM

## 2015-11-11 DIAGNOSIS — R079 Chest pain, unspecified: Secondary | ICD-10-CM | POA: Diagnosis present

## 2015-11-11 DIAGNOSIS — E662 Morbid (severe) obesity with alveolar hypoventilation: Secondary | ICD-10-CM | POA: Diagnosis present

## 2015-11-11 DIAGNOSIS — R590 Localized enlarged lymph nodes: Secondary | ICD-10-CM

## 2015-11-11 DIAGNOSIS — R110 Nausea: Secondary | ICD-10-CM | POA: Insufficient documentation

## 2015-11-11 DIAGNOSIS — G4733 Obstructive sleep apnea (adult) (pediatric): Secondary | ICD-10-CM | POA: Diagnosis present

## 2015-11-11 DIAGNOSIS — E1165 Type 2 diabetes mellitus with hyperglycemia: Secondary | ICD-10-CM | POA: Diagnosis present

## 2015-11-11 DIAGNOSIS — I272 Other secondary pulmonary hypertension: Secondary | ICD-10-CM | POA: Diagnosis present

## 2015-11-11 DIAGNOSIS — R0600 Dyspnea, unspecified: Secondary | ICD-10-CM | POA: Diagnosis present

## 2015-11-11 DIAGNOSIS — J9811 Atelectasis: Secondary | ICD-10-CM | POA: Diagnosis present

## 2015-11-11 DIAGNOSIS — I5031 Acute diastolic (congestive) heart failure: Secondary | ICD-10-CM | POA: Diagnosis present

## 2015-11-11 DIAGNOSIS — F3160 Bipolar disorder, current episode mixed, unspecified: Secondary | ICD-10-CM | POA: Diagnosis present

## 2015-11-11 DIAGNOSIS — Z794 Long term (current) use of insulin: Secondary | ICD-10-CM

## 2015-11-11 DIAGNOSIS — Z9119 Patient's noncompliance with other medical treatment and regimen: Secondary | ICD-10-CM

## 2015-11-11 DIAGNOSIS — Z8639 Personal history of other endocrine, nutritional and metabolic disease: Secondary | ICD-10-CM

## 2015-11-11 DIAGNOSIS — I10 Essential (primary) hypertension: Secondary | ICD-10-CM

## 2015-11-11 DIAGNOSIS — Z6841 Body Mass Index (BMI) 40.0 and over, adult: Secondary | ICD-10-CM

## 2015-11-11 DIAGNOSIS — I110 Hypertensive heart disease with heart failure: Principal | ICD-10-CM | POA: Diagnosis present

## 2015-11-11 DIAGNOSIS — R042 Hemoptysis: Secondary | ICD-10-CM | POA: Insufficient documentation

## 2015-11-11 DIAGNOSIS — I5032 Chronic diastolic (congestive) heart failure: Secondary | ICD-10-CM | POA: Diagnosis present

## 2015-11-11 DIAGNOSIS — R9389 Abnormal findings on diagnostic imaging of other specified body structures: Secondary | ICD-10-CM | POA: Diagnosis present

## 2015-11-11 DIAGNOSIS — Z888 Allergy status to other drugs, medicaments and biological substances status: Secondary | ICD-10-CM

## 2015-11-11 LAB — BASIC METABOLIC PANEL
Anion gap: 13 (ref 5–15)
BUN: 6 mg/dL (ref 6–20)
CALCIUM: 9 mg/dL (ref 8.9–10.3)
CO2: 26 mmol/L (ref 22–32)
Chloride: 97 mmol/L — ABNORMAL LOW (ref 101–111)
Creatinine, Ser: 0.51 mg/dL (ref 0.44–1.00)
GFR calc Af Amer: >60 mL/min (ref >60)
Glucose, Bld: 173 mg/dL — ABNORMAL HIGH (ref 65–99)
POTASSIUM: 4.4 mmol/L (ref 3.5–5.1)
SODIUM: 136 mmol/L (ref 135–145)

## 2015-11-11 LAB — CBC
HCT: 36 % (ref 36.0–46.0)
Hemoglobin: 11.1 g/dL — ABNORMAL LOW (ref 12.0–15.0)
MCH: 24.9 pg — AB (ref 26.0–34.0)
MCHC: 30.8 g/dL (ref 30.0–36.0)
MCV: 80.9 fL (ref 78.0–100.0)
PLATELETS: 279 10*3/uL (ref 150–400)
RBC: 4.45 MIL/uL (ref 3.87–5.11)
RDW: 15.7 % — AB (ref 11.5–15.5)
WBC: 6.9 10*3/uL (ref 4.0–10.5)

## 2015-11-11 LAB — BRAIN NATRIURETIC PEPTIDE: B NATRIURETIC PEPTIDE 5: 130.3 pg/mL — AB (ref 0.0–100.0)

## 2015-11-11 LAB — I-STAT TROPONIN, ED: TROPONIN I, POC: 0 ng/mL (ref 0.00–0.08)

## 2015-11-11 MED ORDER — VANCOMYCIN HCL 10 G IV SOLR
2500.0000 mg | Freq: Once | INTRAVENOUS | Status: AC
  Administered 2015-11-11: 2500 mg via INTRAVENOUS
  Filled 2015-11-11: qty 2500

## 2015-11-11 MED ORDER — CEFEPIME HCL 2 G IJ SOLR
2.0000 g | Freq: Once | INTRAMUSCULAR | Status: AC
  Administered 2015-11-11: 2 g via INTRAVENOUS
  Filled 2015-11-11: qty 2

## 2015-11-11 MED ORDER — VANCOMYCIN HCL 10 G IV SOLR
1500.0000 mg | Freq: Three times a day (TID) | INTRAVENOUS | Status: DC
  Filled 2015-11-11: qty 1500

## 2015-11-11 MED ORDER — IOHEXOL 350 MG/ML SOLN
100.0000 mL | Freq: Once | INTRAVENOUS | Status: AC | PRN
  Administered 2015-11-11: 100 mL via INTRAVENOUS

## 2015-11-11 NOTE — ED Notes (Addendum)
Pt states that she has had left chest pain that is sharp for 6 day. Pt states that pain is intermittent. Pt reports some nausea as well. Pt was sent here from [place] for possible pneumonia

## 2015-11-11 NOTE — ED Notes (Signed)
Ambulated patient in the hallway at the request of the MD... Her oxygen maintain at 93-95 and pulse between 110-115.. patient stated she dont walk as good as normal

## 2015-11-11 NOTE — H&P (Signed)
PCP:  [name], MD    Referring provider [place] resident   Chief Complaint: Abnormal CT  HPI: [name] is a 50 y.o. female   has a past medical history of Insulin dependent type 2 diabetes mellitus, uncontrolled ([place]); Chronic diastolic CHF (congestive heart failure) ([place]); Morbid obesity with BMI of 50.0-59.9, adult ([place]); Bipolar disease, chronic ([place]); History of thyrotoxicosis; OSA (obstructive sleep apnea) (03/06/2011); Chest pain; Chronic diastolic congestive heart failure ([place]) (07/23/2011); and Hypertensive heart disease (10/18/2013).   Presented with patient's been having chronic cough for past few months it is occasionally productive greenish sputum she denies any fever currently but states had some fever few weeks ago better after antibiotics. She is originally from [place].  Patient reports worsening shortness of breath now troubled with ambulation with short distances. She was recently admitted for CHF exacerbation but reports that the cough is no better after her discharge still continued to have some coughing. Apparently she was treated recently with Levaquin while in-house and was prescribed nebulizer treatments but never filled her prescriptions Patient was seen by her primary care in January prescribed Levaquin and albuterol. She's been having significant shortness of breath at night. On February 6 she was seen in the emergency department given a dose of Lasix was doing better and discharged to home. She reportedly had a CT scan done by her primary care office showing increased mediastinal adenopathy and hilar adenopathy as well as groundglass patchy attenuation both lungs possibly opportunistic infection. Her primary care provider sent her to emergency department further evaluation. Patient also endorsing some chest pain that has been intermittent affecting her left side  IN ER:  Satting 96-98% on room air, no hypoxia with ambulation while blood cell count 6.9  hemoglobin 11.1. She was started on cefepime and vancomycin for healthcare acquired pneumonia   Regarding pertinent past history: Patient has known history of chronic diastolic heart failure, per Myoview in 2012 EF was 63% A she was discharged from hospital on January 8 with acute on chronic congestive heart failure he has known history of pulmonary hypertension  history of obstructive sleep apnea for which she is noncompliant with C Pap and history of diabetes mellitus.   Hospitalist was called for admission for atypical chest pain   Review of Systems:    Pertinent positives include: chest pain, shortness of breath at rest. dyspnea on exertion,   Constitutional:  No weight loss, night sweats, Fevers, chills, fatigue, weight loss  HEENT:  No headaches, Difficulty swallowing,Tooth/dental problems,Sore throat,  No sneezing, itching, ear ache, nasal congestion, post nasal drip,  Cardio-vascular:  No  Orthopnea, PND, anasarca, dizziness, palpitations.no Bilateral lower extremity swelling  GI:  No heartburn, indigestion, abdominal pain, nausea, vomiting, diarrhea, change in bowel habits, loss of appetite, melena, blood in stool, hematemesis Resp:   No excess mucus, no productive cough, No non-productive cough, No coughing up of blood.No change in color of mucus.No wheezing. Skin:  no rash or lesions. No jaundice GU:  no dysuria, change in color of urine, no urgency or frequency. No straining to urinate.  No flank pain.  Musculoskeletal:  No joint pain or no joint swelling. No decreased range of motion. No back pain.  Psych:  No change in mood or affect. No depression or anxiety. No memory loss.  Neuro: no localizing neurological complaints, no tingling, no weakness, no double vision, no gait abnormality, no slurred speech, no confusion  Otherwise ROS are negative except for above, 10 systems were reviewed  Past Medical History: Past Medical History  Diagnosis Date  . Insulin  dependent type 2 diabetes mellitus, uncontrolled ([place])   . Chronic diastolic CHF (congestive heart failure) ([place])   . Morbid obesity with BMI of 50.0-59.9, adult ([place])   . Bipolar disease, chronic ([place])   . History of thyrotoxicosis   . OSA (obstructive sleep apnea) 03/06/2011  . Chest pain     a. 2012 Myoview: EF 63%, no isch/infarct  . Chronic diastolic congestive heart failure ([place]) 07/23/2011  . Hypertensive heart disease 10/18/2013   History reviewed. No pertinent past surgical history.   Medications: Prior to Admission medications   Medication Sig Start Date End Date Taking? Authorizing Provider  albuterol (PROVENTIL HFA;VENTOLIN HFA) 108 (90 Base) MCG/ACT inhaler Inhale 1-2 puffs into the lungs every 6 (six) hours as needed for wheezing or shortness of breath. 11/04/15  Yes Shary Decamp, PA-C  amLODipine (NORVASC) 10 MG tablet Take 1 tablet (10 mg total) by mouth daily. 12/03/14  Yes Lance Bosch, NP  fluticasone (FLONASE) 50 MCG/ACT nasal spray Place 2 sprays into both nostrils daily. 01/02/15  Yes Arnoldo Morale, MD  furosemide (LASIX) 80 MG tablet Take 1 tablet (80 mg total) by mouth 2 (two) times daily. 10/06/15  Yes Thurnell Lose, MD  Ginger, Zingiber officinalis, (GINGER PO) Take by mouth 2 (two) times daily. Ginger tea   Yes Historical Provider, MD  glipiZIDE (GLUCOTROL XL) 5 MG 24 hr tablet Take 1 tablet (5 mg total) by mouth daily with breakfast. 06/17/15  Yes Olugbemiga E Doreene Burke, MD  guaiFENesin-codeine 100-10 MG/5ML syrup Take 10 mLs by mouth every 6 (six) hours as needed for cough. 11/04/15  Yes Olugbemiga Essie Christine, MD  LORazepam (ATIVAN) 1 MG tablet Take 1 tablet (1 mg total) by mouth at bedtime. 07/02/14  Yes Tresa Garter, MD  losartan (COZAAR) 100 MG tablet Take 1 tablet (100 mg total) by mouth daily. 08/07/15  Yes Tresa Garter, MD  metFORMIN (GLUCOPHAGE) 1000 MG tablet Take 1 tablet (1,000 mg total) by mouth 2 (two) times daily with a meal. 12/03/14  Yes Lance Bosch,  NP  metoprolol succinate (TOPROL-XL) 50 MG 24 hr tablet Take 1 tablet (50 mg total) by mouth daily. Take with or immediately following a meal. 11/28/13  Yes Olugbemiga E Doreene Burke, MD  OVER THE COUNTER MEDICATION Take 1 scoop by mouth daily as needed (Menstrual Bleeding). Phenigrade Seeds--1 handful   Yes Historical Provider, MD  OVER THE COUNTER MEDICATION Take 1 Dose by mouth daily. Tree gum for cough   Yes Historical Provider, MD  potassium chloride 20 MEQ TBCR Take 20 mEq by mouth 2 (two) times daily. 10/06/15  Yes Thurnell Lose, MD  clotrimazole (LOTRIMIN) 1 % cream Apply to affected area 2 times daily Patient not taking: Reported on 11/04/2015 07/22/15   Billy Fischer, MD  fluconazole (DIFLUCAN) 150 MG tablet Take 1 tablet (150 mg total) by mouth once. Repeat in 1 week Patient not taking: Reported on 11/11/2015 07/22/15   Billy Fischer, MD  HYDROcodone-homatropine [place]) 5-1.5 MG/5ML syrup Take 5 mLs by mouth every 8 (eight) hours as needed for cough. Patient not taking: Reported on 11/11/2015 10/10/15   Brayton Caves, PA-C  levofloxacin (LEVAQUIN) 500 MG tablet Take 1 tablet (500 mg total) by mouth daily. Patient not taking: Reported on 11/04/2015 10/10/15   Brayton Caves, PA-C  mirtazapine (REMERON) 30 MG tablet Take 1 tablet (30 mg total) by mouth at bedtime.  Patient not taking: Reported on 11/11/2015 03/07/15   Tresa Garter, MD  risperidone (RISPERDAL) 4 MG tablet Take 1 tablet (4 mg total) by mouth daily. Patient not taking: Reported on 11/04/2015 08/07/15   Tresa Garter, MD  rosuvastatin (CRESTOR) 10 MG tablet Take 1 tablet (10 mg total) by mouth daily. Patient not taking: Reported on 11/04/2015 06/01/13   Ripudeep Krystal Eaton, MD  terconazole (TERAZOL 3) 80 MG vaginal suppository Place 1 suppository (80 mg total) vaginally at bedtime. Patient not taking: Reported on 11/04/2015 07/22/15   Billy Fischer, MD  traMADol (ULTRAM) 50 MG tablet TAKE ONE TABLET EVERY 12 HOURS AS NEEDED Patient not  taking: Reported on 11/04/2015 10/30/15   Tresa Garter, MD    Allergies:   Allergies  Allergen Reactions  . Acetaminophen     Fits as a child "seizures-like"  . Caffeine     Tense, anxiety, increased urination  . Lisinopril Rash    Rash with lisinopril; but fosinopril is ok per patient    Social History:  Ambulatory   independently   Lives at home       With family     reports that she has never smoked. She has never used smokeless tobacco. She reports that she does not drink alcohol or use illicit drugs.     Family History: family history includes Heart disease in her maternal grandfather and paternal grandfather; Heart failure in her father; Hypertension in her mother; Stroke in her father.    Physical Exam: Patient Vitals for the past 24 hrs:  BP Temp Temp src Pulse Resp SpO2  11/11/15 2200 - - - 88 21 96 %  11/11/15 2130 - - - 85 18 98 %  11/11/15 2100 135/76 mmHg - - 98 24 97 %  11/11/15 2045 142/90 mmHg - - 87 14 97 %  11/11/15 1608 140/72 mmHg 97.5 F (36.4 C) Oral 86 18 96 %    1. General:  in No Acute distress 2. Psychological: Alert and  Oriented 3. Head/ENT:   Moist   Mucous Membranes                          Head Non traumatic, neck supple                          Normal  Dentition 4. SKIN: normal  Skin turgor,  Skin clean Dry and intact no rash 5. Heart: Regular rate and rhythm no Murmur, Rub or gallop 6. Lungs: no wheezes or crackles  But very distant breath sounds  7. Abdomen: Soft, non-tender, Non distended 8. Lower extremities: no clubbing, cyanosis, 2+ edema 9. Neurologically Grossly intact, moving all 4 extremities equally 10. MSK: Normal range of motion  body mass index is unknown because there is no weight on file.   Labs on Admission:   Results for orders placed or performed during the hospital encounter of 11/11/15 (from the past 24 hour(s))  Basic metabolic panel     Status: Abnormal   Collection Time: 11/11/15  4:15 PM  Result  Value Ref Range   Sodium 136 135 - 145 mmol/L   Potassium 4.4 3.5 - 5.1 mmol/L   Chloride 97 (L) 101 - 111 mmol/L   CO2 26 22 - 32 mmol/L   Glucose, Bld 173 (H) 65 - 99 mg/dL   BUN 6 6 - 20 mg/dL   Creatinine, Ser  0.51 0.44 - 1.00 mg/dL   Calcium 9.0 8.9 - 10.3 mg/dL   GFR calc non Af Amer >60 >60 mL/min   GFR calc Af Amer >60 >60 mL/min   Anion gap 13 5 - 15  CBC     Status: Abnormal   Collection Time: 11/11/15  4:15 PM  Result Value Ref Range   WBC 6.9 4.0 - 10.5 K/uL   RBC 4.45 3.87 - 5.11 MIL/uL   Hemoglobin 11.1 (L) 12.0 - 15.0 g/dL   HCT 36.0 36.0 - 46.0 %   MCV 80.9 78.0 - 100.0 fL   MCH 24.9 (L) 26.0 - 34.0 pg   MCHC 30.8 30.0 - 36.0 g/dL   RDW 15.7 (H) 11.5 - 15.5 %   Platelets 279 150 - 400 K/uL  I-stat troponin, ED (not at [facility])     Status: None   Collection Time: 11/11/15  4:27 PM  Result Value Ref Range   Troponin i, poc 0.00 0.00 - 0.08 ng/mL   Comment 3          Brain natriuretic peptide     Status: Abnormal   Collection Time: 11/11/15  8:14 PM  Result Value Ref Range   B Natriuretic Peptide 130.3 (H) 0.0 - 100.0 pg/mL    UA not ordered  Lab Results  Component Value Date   HGBA1C 12.1* 10/05/2015    Estimated Creatinine Clearance: 137.9 mL/min (by C-G formula based on Cr of 0.51).  BNP (last 3 results) No results for input(s): PROBNP in the last 8760 hours.  Other results:  I have pearsonaly reviewed this: ECG REPORT  Rate: 86  Rhythm: NSR  ST&T Change: no ischemic changes QTC 426  There were no vitals filed for this visit.   Cultures:    Component Value Date/Time   SDES URINE, RANDOM 11/04/2013 1832   SPECREQUEST NONE 11/04/2013 1832   CULT  11/04/2013 1832    Multiple bacterial morphotypes present, none predominant. Suggest appropriate recollection if clinically indicated. Performed at [facility] 11/06/2013 FINAL 11/04/2013 1832     Radiological Exams on Admission: Ct Angio Chest W/cm &/or Wo  Cm  11/11/2015  CLINICAL DATA:  Acute on chronic cough with nausea shortness of breath for 1 week bilateral lower extremity pain and swelling EXAM: CT ANGIOGRAPHY CHEST WITH CONTRAST TECHNIQUE: Multidetector CT imaging of the chest was performed using the standard protocol during bolus administration of intravenous contrast. Multiplanar CT image reconstructions and MIPs were obtained to evaluate the vascular anatomy. CONTRAST:  176mL OMNIPAQUE IOHEXOL 350 MG/ML SOLN COMPARISON:  11/04/2015, 12/22/2010 FINDINGS: There is mild diffuse patchy bilateral ground-glass attenuation. No pleural effusion. No consolidation. Thoracic aorta is not dilated. The heart is mildly enlarged. No significant pericardial effusion. Enlarged mediastinal lymph nodes including 9 mm AP window lymph node. Right hilar lymph node measures 11 mm. Subcarinal adenopathy measures 15 mm. Suboptimal evaluation of third order pulmonary arterial branches due to patient body habitus. Cannot exclude the possibility of peripheral pulmonary emboli therefore. This study is sufficient to exclude emboli in the main pulmonary artery, right and left main pulmonary arteries, and perihilar branches. No acute findings in the upper abdomen. No acute musculoskeletal findings. Review of the MIP images confirms the above findings. IMPRESSION: Increased mediastinal adenopathy and hilar adenopathy and increased ground-glass patchy a attenuation in both lungs when compared to prior CT scan 12/22/2010. Findings suggest possibility of opportunistic infection. Other possibilities including eosinophilic pneumonia, hypersensitivity pneumonitis, or chronic interstitial  pneumonia. Limited study regarding the possibility of pulmonary emboli but there are no large central emboli detected. Electronically Signed   By: Skipper Cliche M.D.   On: 11/11/2015 16:06    Chart has been reviewed  Family not  at  Bedside    Assessment/Plan  50 yo F with hx of CHF, HTN, chronic  cough here with abnormal CT chest worrisome for opportunistic infection With atypical chest pain.   Present on Admission:    dyspnea  - multifactorial, likely combination of obesity hypoventilation syndrome as well as chronic diastolic heart failure. Cardiac etiology is also possibility would benefit from cardiac workup will cycle cardiac enzymes recently echogram showed no evidence for motion abnormality  . Chronic diastolic congestive heart failure ([place]) patient is still poorly compliant with Lasix will restart home regimen really encourage compliance  . Abnormal CT scan, chest - pulmonology consult in the morning for right now hold off on antibiotics, obtain a AFBs for possible MAC. No symptoms worrisome for  TB  . Atypical chest pain - given risk factors will admit, monitor on telemetry, cycle cardiac enzymes, obtain serial ECG. Further risk stratify with lipid panel, hgA1C, obtain TSH. Make sure patient is on Aspirin. Further treatment based on the currently pending results.  . OSA (obstructive sleep apnea)- non compliant with C-pap - she has been noncompliant at home. States she is scheduled to have sleep apnea test in March will need adjustment of her C-pap machine to improve tolerance . HTN (hypertension) - chronic continue home medications  Prophylaxis:   Lovenox   CODE STATUS:  FULL CODE  as per patient    Disposition:   To home once workup is complete and patient is stable  Other plan as per orders.  I have spent a total of 65 min on this admission   extra time was spent to discuss case with PCCM  [place] 11/11/2015, 11:56 PM    [facility]  Pager [phone]   after 2 AM please page floor coverage PA If 7AM-7PM, please contact the day team taking care of the patient  [email or web address]  Password [email or web address]

## 2015-11-11 NOTE — ED Provider Notes (Signed)
CSN: [id number]     Arrival date & time 11/11/15  1554 History   First MD Initiated Contact with Patient 11/11/15 1859     Chief Complaint  Patient presents with  . Chest Pain    Patient is a 50 y.o. female presenting with chest pain. The history is provided by the patient and medical records. No language interpreter was used.  Chest Pain Pain location:  Substernal area Pain quality: aching   Pain radiates to:  Does not radiate Pain radiates to the back: no   Pain severity:  Moderate Onset quality:  Gradual Duration:  1 month Timing:  Intermittent Progression:  Partially resolved Chronicity:  Chronic Context: at rest   Relieved by:  None tried Worsened by:  Nothing tried Ineffective treatments:  None tried Associated symptoms: cough and shortness of breath   Associated symptoms: no abdominal pain, no dizziness, no fever, no syncope, not vomiting and no weakness     Past Medical History  Diagnosis Date  . Insulin dependent type 2 diabetes mellitus, uncontrolled ([place])   . Chronic diastolic CHF (congestive heart failure) ([place])   . Morbid obesity with BMI of 50.0-59.9, adult ([place])   . Bipolar disease, chronic ([place])   . History of thyrotoxicosis   . OSA (obstructive sleep apnea) 03/06/2011  . Chest pain     a. 2012 Myoview: EF 63%, no isch/infarct  . Chronic diastolic congestive heart failure ([place]) 07/23/2011  . Hypertensive heart disease 10/18/2013   History reviewed. No pertinent past surgical history. Family History  Problem Relation Age of Onset  . Heart failure Father   . Stroke Father   . Heart disease Maternal Grandfather   . Heart disease Paternal Grandfather   . Hypertension Mother    Social History  Substance Use Topics  . Smoking status: Never Smoker   . Smokeless tobacco: Never Used  . Alcohol Use: No   OB History    No data available     Review of Systems  Constitutional: Positive for activity change. Negative for fever, chills and appetite change.   HENT: Negative for congestion, sinus pressure and sneezing.   Eyes: Negative for photophobia, pain, redness and visual disturbance.  Respiratory: Positive for cough and shortness of breath.   Cardiovascular: Positive for chest pain. Negative for syncope.  Gastrointestinal: Negative for vomiting, abdominal pain, diarrhea and constipation.  Endocrine: Negative for polydipsia, polyphagia and polyuria.  Genitourinary: Negative for dysuria and difficulty urinating.  Musculoskeletal: Negative for joint swelling and gait problem.  Skin: Negative for rash and wound.  Neurological: Negative for dizziness, facial asymmetry and weakness.  Psychiatric/Behavioral: Negative for behavioral problems and agitation.  All other systems reviewed and are negative.     Allergies  Acetaminophen; Caffeine; and Lisinopril  Home Medications   Prior to Admission medications   Medication Sig Start Date End Date Taking? Authorizing Provider  albuterol (PROVENTIL HFA;VENTOLIN HFA) 108 (90 Base) MCG/ACT inhaler Inhale 1-2 puffs into the lungs every 6 (six) hours as needed for wheezing or shortness of breath. 11/04/15  Yes Shary Decamp, PA-C  amLODipine (NORVASC) 10 MG tablet Take 1 tablet (10 mg total) by mouth daily. 12/03/14  Yes Lance Bosch, NP  fluticasone (FLONASE) 50 MCG/ACT nasal spray Place 2 sprays into both nostrils daily. 01/02/15  Yes Arnoldo Morale, MD  furosemide (LASIX) 80 MG tablet Take 1 tablet (80 mg total) by mouth 2 (two) times daily. 10/06/15  Yes Thurnell Lose, MD  Ginger, Zingiber officinalis, (GINGER PO)  Take by mouth 2 (two) times daily. Ginger tea   Yes Historical Provider, MD  glipiZIDE (GLUCOTROL XL) 5 MG 24 hr tablet Take 1 tablet (5 mg total) by mouth daily with breakfast. 06/17/15  Yes Olugbemiga E Doreene Burke, MD  guaiFENesin-codeine 100-10 MG/5ML syrup Take 10 mLs by mouth every 6 (six) hours as needed for cough. 11/04/15  Yes Olugbemiga Essie Christine, MD  LORazepam (ATIVAN) 1 MG tablet Take 1 tablet  (1 mg total) by mouth at bedtime. 07/02/14  Yes Tresa Garter, MD  losartan (COZAAR) 100 MG tablet Take 1 tablet (100 mg total) by mouth daily. 08/07/15  Yes Tresa Garter, MD  metFORMIN (GLUCOPHAGE) 1000 MG tablet Take 1 tablet (1,000 mg total) by mouth 2 (two) times daily with a meal. 12/03/14  Yes Lance Bosch, NP  metoprolol succinate (TOPROL-XL) 50 MG 24 hr tablet Take 1 tablet (50 mg total) by mouth daily. Take with or immediately following a meal. 11/28/13  Yes Olugbemiga E Doreene Burke, MD  OVER THE COUNTER MEDICATION Take 1 scoop by mouth daily as needed (Menstrual Bleeding). Phenigrade Seeds--1 handful   Yes Historical Provider, MD  OVER THE COUNTER MEDICATION Take 1 Dose by mouth daily. Tree gum for cough   Yes Historical Provider, MD  potassium chloride 20 MEQ TBCR Take 20 mEq by mouth 2 (two) times daily. 10/06/15  Yes Thurnell Lose, MD  clotrimazole (LOTRIMIN) 1 % cream Apply to affected area 2 times daily Patient not taking: Reported on 11/04/2015 07/22/15   Billy Fischer, MD  fluconazole (DIFLUCAN) 150 MG tablet Take 1 tablet (150 mg total) by mouth once. Repeat in 1 week Patient not taking: Reported on 11/11/2015 07/22/15   Billy Fischer, MD  HYDROcodone-homatropine [facility]) 5-1.5 MG/5ML syrup Take 5 mLs by mouth every 8 (eight) hours as needed for cough. Patient not taking: Reported on 11/11/2015 10/10/15   Brayton Caves, PA-C  levofloxacin (LEVAQUIN) 500 MG tablet Take 1 tablet (500 mg total) by mouth daily. Patient not taking: Reported on 11/04/2015 10/10/15   Brayton Caves, PA-C  mirtazapine (REMERON) 30 MG tablet Take 1 tablet (30 mg total) by mouth at bedtime. Patient not taking: Reported on 11/11/2015 03/07/15   Tresa Garter, MD  risperidone (RISPERDAL) 4 MG tablet Take 1 tablet (4 mg total) by mouth daily. Patient not taking: Reported on 11/04/2015 08/07/15   Tresa Garter, MD  rosuvastatin (CRESTOR) 10 MG tablet Take 1 tablet (10 mg total) by mouth daily. Patient  not taking: Reported on 11/04/2015 06/01/13   Ripudeep Krystal Eaton, MD  terconazole (TERAZOL 3) 80 MG vaginal suppository Place 1 suppository (80 mg total) vaginally at bedtime. Patient not taking: Reported on 11/04/2015 07/22/15   Billy Fischer, MD  traMADol (ULTRAM) 50 MG tablet TAKE ONE TABLET EVERY 12 HOURS AS NEEDED Patient not taking: Reported on 11/04/2015 10/30/15   Tresa Garter, MD   BP 141/89 mmHg  Pulse 87  Temp(Src) 97.5 F (36.4 C) (Oral)  Resp 21  SpO2 96%  LMP 09/24/2015 Physical Exam  Constitutional: She is oriented to person, place, and time. She appears well-developed and well-nourished. No distress.  HENT:  Head: Normocephalic and atraumatic.  Eyes: Pupils are equal, round, and reactive to light.  Neck: Normal range of motion.  Cardiovascular: Normal rate, regular rhythm and normal heart sounds.  Exam reveals no gallop and no friction rub.   No murmur heard. Pulmonary/Chest: No respiratory distress. She has no wheezes. She  has no rales. She exhibits no tenderness.  Abdominal: She exhibits distension. There is no tenderness. There is no rebound and no guarding.  Musculoskeletal: Normal range of motion. She exhibits edema.  Neurological: She is alert and oriented to person, place, and time.  Skin: Skin is warm. She is not diaphoretic. No erythema.  Psychiatric: She has a normal mood and affect. Her behavior is normal.  Nursing note and vitals reviewed.   ED Course  Procedures (including critical care time) Labs Review Labs Reviewed  BASIC METABOLIC PANEL - Abnormal; Notable for the following:    Chloride 97 (*)    Glucose, Bld 173 (*)    All other components within normal limits  CBC - Abnormal; Notable for the following:    Hemoglobin 11.1 (*)    MCH 24.9 (*)    RDW 15.7 (*)    All other components within normal limits  BRAIN NATRIURETIC PEPTIDE - Abnormal; Notable for the following:    B Natriuretic Peptide 130.3 (*)    All other components within normal limits   CULTURE, BLOOD (ROUTINE X 2)  CULTURE, BLOOD (ROUTINE X 2)  I-STAT TROPOININ, ED    Imaging Review Ct Angio Chest W/cm &/or Wo Cm  11/11/2015  CLINICAL DATA:  Acute on chronic cough with nausea shortness of breath for 1 week bilateral lower extremity pain and swelling EXAM: CT ANGIOGRAPHY CHEST WITH CONTRAST TECHNIQUE: Multidetector CT imaging of the chest was performed using the standard protocol during bolus administration of intravenous contrast. Multiplanar CT image reconstructions and MIPs were obtained to evaluate the vascular anatomy. CONTRAST:  135mL OMNIPAQUE IOHEXOL 350 MG/ML SOLN COMPARISON:  11/04/2015, 12/22/2010 FINDINGS: There is mild diffuse patchy bilateral ground-glass attenuation. No pleural effusion. No consolidation. Thoracic aorta is not dilated. The heart is mildly enlarged. No significant pericardial effusion. Enlarged mediastinal lymph nodes including 9 mm AP window lymph node. Right hilar lymph node measures 11 mm. Subcarinal adenopathy measures 15 mm. Suboptimal evaluation of third order pulmonary arterial branches due to patient body habitus. Cannot exclude the possibility of peripheral pulmonary emboli therefore. This study is sufficient to exclude emboli in the main pulmonary artery, right and left main pulmonary arteries, and perihilar branches. No acute findings in the upper abdomen. No acute musculoskeletal findings. Review of the MIP images confirms the above findings. IMPRESSION: Increased mediastinal adenopathy and hilar adenopathy and increased ground-glass patchy a attenuation in both lungs when compared to prior CT scan 12/22/2010. Findings suggest possibility of opportunistic infection. Other possibilities including eosinophilic pneumonia, hypersensitivity pneumonitis, or chronic interstitial pneumonia. Limited study regarding the possibility of pulmonary emboli but there are no large central emboli detected. Electronically Signed   By: Skipper Cliche M.D.   On:  11/11/2015 16:06   I have personally reviewed and evaluated these images and lab results as part of my medical decision-making.   EKG Interpretation None      MDM   Final diagnoses:  Abnormal CT scan, chest  Other chest pain   Patient is a 50 year old female who presented by primary care physician for evaluation and treatment of abnormal CT scan. CT PE study performed this morning for evaluation treatment of chronic cough and hemoptysis. CT scan concerning for opportunistic infection versus eosinophilic pneumonia versus hypersensitivity pneumonitis versus chronic interstitial pneumonia.    Upon arrival patient afebrile in no acute distress..  EKG with rate 86, PR interval 162, QRS 88, QTC 464. No ischemic changes on EKG.  Troponin 0, BNP 130.3. BMP unremarkable.  Patient given vancomycin and cefepime in the emergency department to cover HCAP.   Patient seen by hospitalist and admitted for treatment of chest pain and abnormal CT findings. Plan to consult pulmonary specialist in the a.m.  No further emergency department events patient transferred to the floor in stable condition.  Discussed case my attending Dr. Tomi Bamberger.  Vira Blanco, MD 11/12/15 [id number]  Dorie Rank, MD 11/12/15 2013

## 2015-11-11 NOTE — Progress Notes (Signed)
Pharmacy Antibiotic Note  [name] is a 50 y.o. female admitted on 11/11/2015 with pneumonia.  Pharmacy has been consulted for vancomycin dosing. Pt is afebrile and WBC is WNL. Scr is WNL. CT demonstrated possible pneumonia.   Plan: - Vancomycin 2500mg  IV x 1 then 1500mg  IV Q8H - F/u renal fxn, C&S, clinical status and trough at [facility] - F/u continuation of cefepime or other gram negative coverage.      Temp (24hrs), Avg:97.5 F (36.4 C), Min:97.5 F (36.4 C), Max:97.5 F (36.4 C)   Recent Labs Lab 11/11/15 1615  WBC 6.9  CREATININE 0.51    Estimated Creatinine Clearance: 137.9 mL/min (by C-G formula based on Cr of 0.51).    Allergies  Allergen Reactions  . Acetaminophen     Fits as a child "seizures-like"  . Caffeine     Tense, anxiety, increased urination  . Lisinopril Rash    Rash with lisinopril; but fosinopril is ok per patient    Antimicrobials this admission: Vanc 2/13>> Cefepime 2/13 x 1  Dose adjustments this admission: N/A  Microbiology results: Pending  Thank you for allowing pharmacy to be a part of this patient's care.  Allegra Cerniglia, Rande Lawman 11/11/2015 9:58 PM

## 2015-11-12 DIAGNOSIS — R0789 Other chest pain: Secondary | ICD-10-CM

## 2015-11-12 DIAGNOSIS — I5031 Acute diastolic (congestive) heart failure: Secondary | ICD-10-CM

## 2015-11-12 DIAGNOSIS — I5032 Chronic diastolic (congestive) heart failure: Secondary | ICD-10-CM

## 2015-11-12 DIAGNOSIS — R938 Abnormal findings on diagnostic imaging of other specified body structures: Secondary | ICD-10-CM

## 2015-11-12 LAB — GLUCOSE, CAPILLARY
GLUCOSE-CAPILLARY: 271 mg/dL — AB (ref 65–99)
GLUCOSE-CAPILLARY: 249 mg/dL — AB (ref 65–99)
GLUCOSE-CAPILLARY: 282 mg/dL — AB (ref 65–99)
GLUCOSE-CAPILLARY: 227 mg/dL — AB (ref 65–99)
Glucose-Capillary: 285 mg/dL — ABNORMAL HIGH (ref 65–99)

## 2015-11-12 LAB — CBC
HEMATOCRIT: 33.6 % — AB (ref 36.0–46.0)
Hemoglobin: 10.2 g/dL — ABNORMAL LOW (ref 12.0–15.0)
MCH: 25 pg — ABNORMAL LOW (ref 26.0–34.0)
MCHC: 30.4 g/dL (ref 30.0–36.0)
MCV: 82.4 fL (ref 78.0–100.0)
PLATELETS: 264 10*3/uL (ref 150–400)
RBC: 4.08 MIL/uL (ref 3.87–5.11)
RDW: 15.6 % — AB (ref 11.5–15.5)
WBC: 6.5 10*3/uL (ref 4.0–10.5)

## 2015-11-12 LAB — RAPID STREP SCREEN (MED CTR MEBANE ONLY): STREPTOCOCCUS, GROUP A SCREEN (DIRECT): NEGATIVE

## 2015-11-12 LAB — TROPONIN I: Troponin I: <0.03 ng/mL (ref <0.031)

## 2015-11-12 LAB — MRSA PCR SCREENING: MRSA BY PCR: NEGATIVE

## 2015-11-12 LAB — SEDIMENTATION RATE: SED RATE: 87 mm/h — AB (ref 0–22)

## 2015-11-12 LAB — CREATININE, SERUM: Creatinine, Ser: 0.61 mg/dL (ref 0.44–1.00)

## 2015-11-12 LAB — LACTATE DEHYDROGENASE: LDH: 142 U/L (ref 98–192)

## 2015-11-12 LAB — HIV ANTIBODY (ROUTINE TESTING W REFLEX): HIV SCREEN 4TH GENERATION: NONREACTIVE

## 2015-11-12 MED ORDER — ONDANSETRON HCL 4 MG/2ML IJ SOLN
4.0000 mg | Freq: Four times a day (QID) | INTRAMUSCULAR | Status: DC | PRN
  Administered 2015-11-17: 4 mg via INTRAVENOUS
  Filled 2015-11-12: qty 2

## 2015-11-12 MED ORDER — MORPHINE SULFATE (PF) 2 MG/ML IV SOLN: 2.0000 mg | INTRAVENOUS | Status: DC | PRN

## 2015-11-12 MED ORDER — IPRATROPIUM-ALBUTEROL 0.5-2.5 (3) MG/3ML IN SOLN
3.0000 mL | Freq: Four times a day (QID) | RESPIRATORY_TRACT | Status: DC
  Administered 2015-11-12 – 2015-11-17 (×18): 3 mL via RESPIRATORY_TRACT
  Filled 2015-11-12 (×19): qty 3

## 2015-11-12 MED ORDER — ROSUVASTATIN CALCIUM 10 MG PO TABS
10.0000 mg | ORAL_TABLET | Freq: Every day | ORAL | Status: DC
  Administered 2015-11-12 – 2015-11-20 (×7): 10 mg via ORAL
  Filled 2015-11-12 (×10): qty 1

## 2015-11-12 MED ORDER — POTASSIUM CHLORIDE CRYS ER 10 MEQ PO TBCR
20.0000 meq | EXTENDED_RELEASE_TABLET | Freq: Two times a day (BID) | ORAL | Status: DC
  Administered 2015-11-12 – 2015-11-19 (×17): 20 meq via ORAL
  Filled 2015-11-12 (×21): qty 2

## 2015-11-12 MED ORDER — BUDESONIDE 0.5 MG/2ML IN SUSP
0.5000 mg | Freq: Two times a day (BID) | RESPIRATORY_TRACT | Status: DC
  Administered 2015-11-12 – 2015-11-20 (×16): 0.5 mg via RESPIRATORY_TRACT
  Filled 2015-11-12 (×18): qty 2

## 2015-11-12 MED ORDER — HYDROCOD POLST-CPM POLST ER 10-8 MG/5ML PO SUER
5.0000 mL | Freq: Two times a day (BID) | ORAL | Status: DC
  Administered 2015-11-12 – 2015-11-21 (×19): 5 mL via ORAL
  Filled 2015-11-12 (×19): qty 5

## 2015-11-12 MED ORDER — LOSARTAN POTASSIUM 50 MG PO TABS
100.0000 mg | ORAL_TABLET | Freq: Every day | ORAL | Status: DC
  Administered 2015-11-12 – 2015-11-17 (×6): 100 mg via ORAL
  Filled 2015-11-12 (×6): qty 2

## 2015-11-12 MED ORDER — BENZONATATE 100 MG PO CAPS
100.0000 mg | ORAL_CAPSULE | Freq: Three times a day (TID) | ORAL | Status: DC
  Administered 2015-11-12 – 2015-11-21 (×28): 100 mg via ORAL
  Filled 2015-11-12 (×28): qty 1

## 2015-11-12 MED ORDER — METOPROLOL SUCCINATE ER 50 MG PO TB24
50.0000 mg | ORAL_TABLET | Freq: Every day | ORAL | Status: DC
  Administered 2015-11-12 – 2015-11-21 (×10): 50 mg via ORAL
  Filled 2015-11-12 (×10): qty 1

## 2015-11-12 MED ORDER — AMLODIPINE BESYLATE 10 MG PO TABS
10.0000 mg | ORAL_TABLET | Freq: Every day | ORAL | Status: DC
  Administered 2015-11-12 – 2015-11-21 (×10): 10 mg via ORAL
  Filled 2015-11-12 (×10): qty 1

## 2015-11-12 MED ORDER — ENOXAPARIN SODIUM 40 MG/0.4ML ~~LOC~~ SOLN
40.0000 mg | SUBCUTANEOUS | Status: DC
  Administered 2015-11-12 – 2015-11-13 (×2): 40 mg via SUBCUTANEOUS
  Filled 2015-11-12 (×3): qty 0.4

## 2015-11-12 MED ORDER — FUROSEMIDE 80 MG PO TABS
80.0000 mg | ORAL_TABLET | Freq: Two times a day (BID) | ORAL | Status: DC
  Administered 2015-11-12 (×2): 80 mg via ORAL
  Filled 2015-11-12 (×3): qty 1

## 2015-11-12 MED ORDER — GUAIFENESIN-CODEINE 100-10 MG/5ML PO SOLN: 10.0000 mL | Freq: Four times a day (QID) | ORAL | Status: DC | PRN

## 2015-11-12 MED ORDER — IPRATROPIUM BROMIDE 0.02 % IN SOLN
0.5000 mg | Freq: Four times a day (QID) | RESPIRATORY_TRACT | Status: DC
  Administered 2015-11-12 (×2): 0.5 mg via RESPIRATORY_TRACT
  Filled 2015-11-12 (×2): qty 2.5

## 2015-11-12 MED ORDER — ALBUTEROL SULFATE (2.5 MG/3ML) 0.083% IN NEBU: 2.5000 mg | INHALATION_SOLUTION | RESPIRATORY_TRACT | Status: DC | PRN

## 2015-11-12 MED ORDER — INSULIN ASPART 100 UNIT/ML ~~LOC~~ SOLN
0.0000 [IU] | Freq: Three times a day (TID) | SUBCUTANEOUS | Status: DC
  Administered 2015-11-12: 8 [IU] via SUBCUTANEOUS
  Administered 2015-11-12: 5 [IU] via SUBCUTANEOUS
  Administered 2015-11-12: 3 [IU] via SUBCUTANEOUS
  Administered 2015-11-13: 8 [IU] via SUBCUTANEOUS
  Administered 2015-11-13: 5 [IU] via SUBCUTANEOUS
  Administered 2015-11-13: 8 [IU] via SUBCUTANEOUS
  Administered 2015-11-14: 5 [IU] via SUBCUTANEOUS
  Administered 2015-11-14: 11 [IU] via SUBCUTANEOUS
  Administered 2015-11-14 – 2015-11-15 (×2): 8 [IU] via SUBCUTANEOUS
  Administered 2015-11-15: 3 [IU] via SUBCUTANEOUS
  Administered 2015-11-15: 5 [IU] via SUBCUTANEOUS
  Administered 2015-11-16: 8 [IU] via SUBCUTANEOUS
  Administered 2015-11-16 (×2): 3 [IU] via SUBCUTANEOUS
  Administered 2015-11-17: 5 [IU] via SUBCUTANEOUS
  Administered 2015-11-17: 8 [IU] via SUBCUTANEOUS
  Administered 2015-11-17: 15 [IU] via SUBCUTANEOUS
  Administered 2015-11-18: 8 [IU] via SUBCUTANEOUS
  Administered 2015-11-18: 11 [IU] via SUBCUTANEOUS
  Administered 2015-11-18: 5 [IU] via SUBCUTANEOUS
  Administered 2015-11-19: 3 [IU] via SUBCUTANEOUS
  Administered 2015-11-19: 5 [IU] via SUBCUTANEOUS
  Administered 2015-11-19 – 2015-11-20 (×2): 3 [IU] via SUBCUTANEOUS
  Administered 2015-11-20: 2 [IU] via SUBCUTANEOUS
  Administered 2015-11-21: 3 [IU] via SUBCUTANEOUS

## 2015-11-12 MED ORDER — INSULIN ASPART 100 UNIT/ML ~~LOC~~ SOLN
0.0000 [IU] | Freq: Every day | SUBCUTANEOUS | Status: DC
  Administered 2015-11-12 – 2015-11-13 (×2): 2 [IU] via SUBCUTANEOUS
  Administered 2015-11-14: 3 [IU] via SUBCUTANEOUS
  Administered 2015-11-15 – 2015-11-16 (×2): 2 [IU] via SUBCUTANEOUS
  Administered 2015-11-18: 4 [IU] via SUBCUTANEOUS

## 2015-11-12 MED ORDER — FLUTICASONE PROPIONATE 50 MCG/ACT NA SUSP
2.0000 | Freq: Every day | NASAL | Status: DC
  Administered 2015-11-12 – 2015-11-21 (×9): 2 via NASAL
  Filled 2015-11-12: qty 16

## 2015-11-12 MED ORDER — TRAMADOL HCL 50 MG PO TABS
50.0000 mg | ORAL_TABLET | Freq: Four times a day (QID) | ORAL | Status: DC | PRN
  Administered 2015-11-17: 50 mg via ORAL
  Filled 2015-11-12: qty 1

## 2015-11-12 NOTE — Progress Notes (Signed)
Pt has been placed on airborne precautions for possible TB infection. No bed in negative pressure room is available for pt transfer at this time. Will continue to monitor.   Grant Fontana RN, BSN

## 2015-11-12 NOTE — Progress Notes (Signed)
Pt will be transferring to 3 east for negative pressure room. Report has been called to [place]. All questions were answered.  Grant Fontana RN, BSN

## 2015-11-12 NOTE — Progress Notes (Signed)
Pt received from 2 west, Vitals stable, blood sugar taken, pt is stable in bed at this time, SCD's on, tolerating diets and meds, no any other specific complain at this point, will continue to monitor.

## 2015-11-12 NOTE — Consult Note (Signed)
Name: [name] MRN: [id number] DOB: [birth year]    ADMISSION DATE:  11/11/2015 CONSULTATION DATE:  11/11/15  REFERRING MD :  Dr. Roel Cluck  CHIEF COMPLAINT:  Abnormal Chest ct scan    HISTORY OF PRESENT ILLNESS:   [name] is a 50 y.o. female with significant comorbidities, was advised admission because of abnormal chest CT scan.  Presented with patient's been having chronic cough for past few months it is occasionally productive greenish sputum she denies any fever currently but states had some fever few weeks ago better after antibiotics. She is originally from [name]. Patient reports worsening shortness of breath now troubled with ambulation with short distances. She was recently admitted for CHF exacerbation but reports that the cough is no better after her discharge still continued to have some coughing. Apparently she was treated recently with Levaquin while in-house and was prescribed nebulizer treatments but never filled her prescriptions Patient was seen by her primary care in January prescribed Levaquin and albuterol. She's been having significant shortness of breath at night. On February 6 she was seen in the emergency department given a dose of Lasix was doing better and discharged to home. She reportedly had a CT scan done by her primary care office showing increased mediastinal adenopathy and hilar adenopathy as well as groundglass patchy attenuation both lungs possibly opportunistic infection. Her primary care provider sent her to emergency department further evaluation. Patient also endorsing some chest pain that has been intermittent affecting her left side.   The patient was advised admission because of abnormal chest CT scan.  In ER -- Satting 96-98% on room air, no hypoxia with ambulation while blood cell count 6.9 hemoglobin 11.1. She was started on cefepime and vancomycin for healthcare acquired pneumonia  Chest CT scan was done on 11/11/2015. She had poor effort,  poor inspiratory effort with a scan. Suboptimal study. Atelectasis at the bases. She had contrast with this study. Not optimal. No filling defects seen in the central pulmonary arteries. She had lymph nodes, enlarged, in the right mediastinum, and right hilar area. Lymph nodes were near the arteries. She has groundglass like infiltrates in both lungs. She had a CAT scan of the chest in 2012 which actually looked worse. Similarly, she had poor effort. There might be some enlarged mediastinal and hilar lymph nodes back in 2012.   All images were personally reviewed.  Patient is from [name]. She has been here in the [place] 14 years. She went to [name] from June until October 2015. She was there for 4 months. She tried looking for work. She ended up working with tuberculosis patients. She wore a mask. That's her only history with TB exposure. Denies any active tuberculosis in the past. She had BCG when she was a child and which was 26.  As far as environmental change or exposure recently, she denies. Please see social history regarding her social history.    Patient tells me that she's been coughing for a little over a month now. Initially had some fevers, cough, congestion, wheezing. She got better with levofloxacin. Cough persisted however. Patient also has chronic edema, worse the last 2 weeks. Patient also with some reflux which causes her to cough.   PAST MEDICAL HISTORY :   has a past medical history of Insulin dependent type 2 diabetes mellitus, uncontrolled ([place]); Chronic diastolic CHF (congestive heart failure) ([place]); Morbid obesity with BMI of 50.0-59.9, adult ([place]); Bipolar disease, chronic ([place]); History of thyrotoxicosis; OSA (obstructive sleep apnea) (03/06/2011);  Chest pain; Chronic diastolic congestive heart failure ([place]) (07/23/2011); and Hypertensive heart disease (10/18/2013).  has no past surgical history on file.   Patient does not have medical insurance. She was diagnosed with  sleep apnea more than 10 years ago but she was not tolerant to CPAP.  Patient was diagnosed with thyrotoxicosis more than 10 years ago. Improved with medicines. She is off medicines now.  Denies any history of blood clot, cancer, asthma, COPD, allergies.  Prior to Admission medications   Medication Sig Start Date End Date Taking? Authorizing Provider  albuterol (PROVENTIL HFA;VENTOLIN HFA) 108 (90 Base) MCG/ACT inhaler Inhale 1-2 puffs into the lungs every 6 (six) hours as needed for wheezing or shortness of breath. 11/04/15  Yes Shary Decamp, PA-C  amLODipine (NORVASC) 10 MG tablet Take 1 tablet (10 mg total) by mouth daily. 12/03/14  Yes Lance Bosch, NP  fluticasone (FLONASE) 50 MCG/ACT nasal spray Place 2 sprays into both nostrils daily. 01/02/15  Yes Arnoldo Morale, MD  furosemide (LASIX) 80 MG tablet Take 1 tablet (80 mg total) by mouth 2 (two) times daily. 10/06/15  Yes Thurnell Lose, MD  Ginger, Zingiber officinalis, (GINGER PO) Take by mouth 2 (two) times daily. Ginger tea   Yes Historical Provider, MD  glipiZIDE (GLUCOTROL XL) 5 MG 24 hr tablet Take 1 tablet (5 mg total) by mouth daily with breakfast. 06/17/15  Yes Olugbemiga E Doreene Burke, MD  guaiFENesin-codeine 100-10 MG/5ML syrup Take 10 mLs by mouth every 6 (six) hours as needed for cough. 11/04/15  Yes Olugbemiga Essie Christine, MD  LORazepam (ATIVAN) 1 MG tablet Take 1 tablet (1 mg total) by mouth at bedtime. 07/02/14  Yes Tresa Garter, MD  losartan (COZAAR) 100 MG tablet Take 1 tablet (100 mg total) by mouth daily. 08/07/15  Yes Tresa Garter, MD  metFORMIN (GLUCOPHAGE) 1000 MG tablet Take 1 tablet (1,000 mg total) by mouth 2 (two) times daily with a meal. 12/03/14  Yes Lance Bosch, NP  metoprolol succinate (TOPROL-XL) 50 MG 24 hr tablet Take 1 tablet (50 mg total) by mouth daily. Take with or immediately following a meal. 11/28/13  Yes Olugbemiga E Doreene Burke, MD  OVER THE COUNTER MEDICATION Take 1 scoop by mouth daily as needed (Menstrual  Bleeding). Phenigrade Seeds--1 handful   Yes Historical Provider, MD  OVER THE COUNTER MEDICATION Take 1 Dose by mouth daily. Tree gum for cough   Yes Historical Provider, MD  potassium chloride 20 MEQ TBCR Take 20 mEq by mouth 2 (two) times daily. 10/06/15  Yes Thurnell Lose, MD  clotrimazole (LOTRIMIN) 1 % cream Apply to affected area 2 times daily Patient not taking: Reported on 11/04/2015 07/22/15   Billy Fischer, MD  fluconazole (DIFLUCAN) 150 MG tablet Take 1 tablet (150 mg total) by mouth once. Repeat in 1 week Patient not taking: Reported on 11/11/2015 07/22/15   Billy Fischer, MD  HYDROcodone-homatropine [facility]) 5-1.5 MG/5ML syrup Take 5 mLs by mouth every 8 (eight) hours as needed for cough. Patient not taking: Reported on 11/11/2015 10/10/15   Brayton Caves, PA-C  levofloxacin (LEVAQUIN) 500 MG tablet Take 1 tablet (500 mg total) by mouth daily. Patient not taking: Reported on 11/04/2015 10/10/15   Brayton Caves, PA-C  mirtazapine (REMERON) 30 MG tablet Take 1 tablet (30 mg total) by mouth at bedtime. Patient not taking: Reported on 11/11/2015 03/07/15   Tresa Garter, MD  risperidone (RISPERDAL) 4 MG tablet Take 1 tablet (4 mg  total) by mouth daily. Patient not taking: Reported on 11/04/2015 08/07/15   Tresa Garter, MD  rosuvastatin (CRESTOR) 10 MG tablet Take 1 tablet (10 mg total) by mouth daily. Patient not taking: Reported on 11/04/2015 06/01/13   Ripudeep Krystal Eaton, MD  terconazole (TERAZOL 3) 80 MG vaginal suppository Place 1 suppository (80 mg total) vaginally at bedtime. Patient not taking: Reported on 11/04/2015 07/22/15   Billy Fischer, MD  traMADol (ULTRAM) 50 MG tablet TAKE ONE TABLET EVERY 12 HOURS AS NEEDED Patient not taking: Reported on 11/04/2015 10/30/15   Tresa Garter, MD   Allergies  Allergen Reactions  . Acetaminophen     Fits as a child "seizures-like"  . Caffeine     Tense, anxiety, increased urination  . Lisinopril Rash    Rash with lisinopril; but  fosinopril is ok per patient    FAMILY HISTORY:  family history includes Heart disease in her maternal grandfather and paternal grandfather; Heart failure in her father; Hypertension in her mother; Stroke in her father.  Father died from complications of heart disease. He also had stroke. Mother is alive and healthy.  SOCIAL HISTORY:  reports that she has never smoked. She has never used smokeless tobacco. She reports that she does not drink alcohol or use illicit drugs.  As mentioned, patient is from [name]. Has been here 14 years.  not married. No children. She has lived in the same apartment the last 14 years. She states with her mom. Nonsmoker. Denies drinking alcohol. No pets. No birds. Only recent travel was in June 2015 4 months when she went to [name]. Exposed to people with tuberculosis, she dealt with patients with active tuberculosis. She wore a mask though. Denies any other TB exposure. No molds. No hot tub use.   REVIEW OF SYSTEMS:   Constitutional: Negative for fever, chills, weight loss, malaise/fatigue and diaphoresis.  has gained 3 pounds in last 2 weeks.  HENT: Negative for hearing loss, ear pain, nosebleeds, congestion, sore throat, neck pain, tinnitus and ear discharge.   Eyes: Negative for blurred vision, double vision, photophobia, pain, discharge and redness.  Respiratory: patient with chronic dyspnea, worse with swelling of her legs. Cough the last month or so. No chest pain. No hemoptysis. Chronic 2-3 pillow orthopnea. Denies wheezing.  Cardiovascular: positive for orthopnea. Denies chest pain.  Gastrointestinal: Negative for  diarrhea, constipation, blood in stool and melena.  recent reflux and abdominal comfort.  Genitourinary: Negative for dysuria, urgency, frequency, hematuria and flank pain.  Musculoskeletal: Negative for myalgias, back pain, joint pain and falls.  Skin: Negative for itching and rash.  Neurological: Negative for dizziness, tingling, tremors,  sensory change, speech change, focal weakness, seizures, loss of consciousness, weakness and headaches.  Endo/Heme/Allergies: Negative for environmental allergies and polydipsia. Does not bruise/bleed easily.  SUBJECTIVE:   VITAL SIGNS: Temp:  [97.5 F (36.4 C)-98.2 F (36.8 C)] 98.2 F (36.8 C) (02/14 1243) Pulse Rate:  [79-98] 79 (02/14 1142) Resp:  [14-24] 20 (02/14 1243) BP: (134-169)/(72-90) 169/81 mmHg (02/14 1142) SpO2:  [96 %-100 %] 100 % (02/14 1142) FiO2 (%):  [0 %] 0 % (02/14 0215) Weight:  [361 lb 15.9 oz (164.2 kg)] 361 lb 15.9 oz (164.2 kg) (02/14 0216)  PHYSICAL EXAMINATION:  Vitals:  Filed Vitals:   11/12/15 0233 11/12/15 0924 11/12/15 1142 11/12/15 1243  BP:   169/81   Pulse:   79   Temp:    98.2 F (36.8 C)  TempSrc:  Oral  Resp:    20  Height:      Weight:      SpO2: 98% 100% 100%     Constitutional/General:  Pleasant, well-nourished, well-developed, not in any distress,  Comfortably seating.  Well kempt  Body mass index is 56.68 kg/(m^2). Wt Readings from Last 3 Encounters:  11/12/15 361 lb 15.9 oz (164.2 kg)  11/04/15 350 lb (158.759 kg)  11/04/15 350 lb (158.759 kg)    Neck circumference:   HEENT: Pupils equal and reactive to light and accommodation. Anicteric sclerae. Normal nasal mucosa.   No oral  lesions,  mouth clear,  oropharynx clear, no postnasal drip. (-) Oral thrush. No dental caries.  Airway - Mallampati class IV  Neck: No masses. Midline trachea. No JVD, (-) LAD. (-) bruits appreciated.  Respiratory/Chest: Grossly normal chest. (-) deformity. (-) Accessory muscle use.  Symmetric expansion. (-) Tenderness on palpation.  Resonant on percussion.  Diminished BS on both lower lung zones. (-) wheezing, rhonchi Bibasilar crackles.  (-) egophony  Cardiovascular: Regular rate and  rhythm, heart sounds normal, no murmur or gallops, Gr 2 edema  Gastrointestinal:  Normal bowel sounds. Soft, non-tender. No hepatosplenomegaly.  (-)  masses.   Musculoskeletal:  Normal muscle tone. Normal gait.   Extremities: Grossly normal. (-) clubbing, cyanosis.  (+) Gr 2 edema  Skin: (-) rash,lesions seen.   Neurological/Psychiatric : alert, oriented to time, place, person. Normal mood and affect       Recent Labs Lab 11/11/15 1615 11/12/15 0225  NA 136  --   K 4.4  --   CL 97*  --   CO2 26  --   BUN 6  --   CREATININE 0.51 0.61  GLUCOSE 173*  --     Recent Labs Lab 11/11/15 1615 11/12/15 0225  HGB 11.1* 10.2*  HCT 36.0 33.6*  WBC 6.9 6.5  PLT 279 264   Ct Angio Chest W/cm &/or Wo Cm  11/11/2015  CLINICAL DATA:  Acute on chronic cough with nausea shortness of breath for 1 week bilateral lower extremity pain and swelling EXAM: CT ANGIOGRAPHY CHEST WITH CONTRAST TECHNIQUE: Multidetector CT imaging of the chest was performed using the standard protocol during bolus administration of intravenous contrast. Multiplanar CT image reconstructions and MIPs were obtained to evaluate the vascular anatomy. CONTRAST:  131mL OMNIPAQUE IOHEXOL 350 MG/ML SOLN COMPARISON:  11/04/2015, 12/22/2010 FINDINGS: There is mild diffuse patchy bilateral ground-glass attenuation. No pleural effusion. No consolidation. Thoracic aorta is not dilated. The heart is mildly enlarged. No significant pericardial effusion. Enlarged mediastinal lymph nodes including 9 mm AP window lymph node. Right hilar lymph node measures 11 mm. Subcarinal adenopathy measures 15 mm. Suboptimal evaluation of third order pulmonary arterial branches due to patient body habitus. Cannot exclude the possibility of peripheral pulmonary emboli therefore. This study is sufficient to exclude emboli in the main pulmonary artery, right and left main pulmonary arteries, and perihilar branches. No acute findings in the upper abdomen. No acute musculoskeletal findings. Review of the MIP images confirms the above findings. IMPRESSION: Increased mediastinal adenopathy and hilar adenopathy  and increased ground-glass patchy a attenuation in both lungs when compared to prior CT scan 12/22/2010. Findings suggest possibility of opportunistic infection. Other possibilities including eosinophilic pneumonia, hypersensitivity pneumonitis, or chronic interstitial pneumonia. Limited study regarding the possibility of pulmonary emboli but there are no large central emboli detected. Electronically Signed   By: Skipper Cliche M.D.   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 Patient has significant comorbidities which include untreated sleep apnea, diastolic heart failure. Patient was admitted in January for cough and congestion and dyspnea. Initially improved with Levaquin. Cough persisted. She ended up getting a chest CT scan which was read as abnormal. Chest CT scan showed no filling defects, poor inspiratory effort, mediastinal and hilar lymphadenopathy, bilateral patchy groundglass like infiltrates. She had a chest CT scan in 2012 which looks similar but the lymph nodes were not as prominent. Patient with recent swelling and edema. She denies other symptoms such as fevers, chills, hemoptysis.  Differential diagnosis for abnormal chest CT scan: 1. Could be related to underlying pulmonary venous congestion, diastolic heart failure. Patient would recent swelling of both lower extremities.  2. Possible infection. She could have atypical infection, pneumonia. Denies any fungal exposure. No history of fungal infection. Only exposure to tuberculosis was back in 2015 in [name]. Possible to have TB but less likely. Less likely MAC.  3. Could be related to upper airway congestion/upper airway cough syndrome. 4. Could be related to aspiration pneumonitis. She does have some reflux and this causes her to cough. 5. Less likely malignancy. 6. Could be  inflammatory.  Rest of assessment:  Patient also has likely severe sleep apnea, not tolerant to treatment. The patient likely has obesity hypoventilation syndrome. GERD  Plan: 1. She is in isolation for TB, contact precautions. 2. I extensively discussed with her the need and the risks and the possible complications related to bronchoscopy. She is a difficult airway. If we need to do bronchoscopy on her, we will most likely need anesthesia to help protect the airway. She understands and accepts the risks but wants to hold off as much as possible. 3. Agree with TB quantiferon  blood tests. If negative, less likely to have tuberculosis. 4. Since she has cough with sputum, plan to give 3 samples for AFB smear and culture. 5. Send sputum for culture as well, Gram stain. 6. Check HIV. 7. Check urine for Legionella and streptococcus. 8. Do blood culture and urine culture if not yet done. 9. We'll start Pulmicort twice a day and DuoNeb 4 times a day. 10. Incentive spirometry. Flutter valve as well. 11. Suggest doing Dopplers of both her lower extremities to check for blood clot if it hasn't been done. 12. She will need diuresis. 13. DVT prophylaxis. 14. Treat reflux with PPI. 15. Continue other meds.   Thank you very much for letting me participate in this patient's care.   Elsie Saas A. Corrie Dandy, MD Pulmonary and [place] Pager : [phone] After 3 pm or if no response, call [phone]  11/12/2015, 1:10 PM

## 2015-11-12 NOTE — Progress Notes (Signed)
[facility] PROGRESS NOTE  [name] [id number] DOB: March 23, 1966 DOA: 11/11/2015 PCP: [name], MD  Assessment/Plan: Dyspnea/Hilar and mediastinal adenopathy and ground glass changes/Cough -concern for OI based on CT - TB less likely but will check Sputum AFB pending, add Quantiferon gold -will keep in Airborne isolation for now -Add antitussives -Pulm consulted -originally from [place], no recent travel -add ACE level, check LDH  . Acute on Chronic diastolic congestive heart failure ([place]) - patient is poorly compliant with Lasix  -po lasix 80mg  BID  . Atypical chest pain  -due to #1, cough, resolved  . OSA (obstructive sleep apnea)- non compliant with C-pap - she has been noncompliant at home. States she is scheduled to have sleep apnea test in March will need adjustment of her C-pap machine to improve tolerance  . HTN (hypertension) - chronic continue home medications  Prophylaxis: Lovenox   CODE STATUS: FULL CODE as per patient  Communication: none at bedside Disposition: pending workup   Consultants: Pulm pending   HPI/Subjective: Coughing and DoE for months  Objective: Filed Vitals:   11/12/15 0015 11/12/15 0229  BP: 141/89 134/81  Pulse: 87 84  Temp:  98.1 F (36.7 C)  Resp: 21 21    Intake/Output Summary (Last 24 hours) at 11/12/15 0936 Last data filed at 11/12/15 0845  Gross per 24 hour  Intake    360 ml  Output      0 ml  Net    360 ml   Filed Weights   11/12/15 0216  Weight: 164.2 kg (361 lb 15.9 oz)    Exam:   General:  AAOx3, obese, coughing continuously   Cardiovascular: S1S2/RRR  Respiratory: poor air movement b/l  Abdomen: soft, Nt, BS present  Musculoskeletal: 1plus edema  Data Reviewed: Basic Metabolic Panel:  Recent Labs Lab 11/11/15 1615 11/12/15 0225  NA 136  --   K 4.4  --   CL 97*  --   CO2 26  --   GLUCOSE 173*  --   BUN 6  --   CREATININE 0.51 0.61  CALCIUM 9.0  --    Liver  Function Tests: No results for input(s): AST, ALT, ALKPHOS, BILITOT, PROT, ALBUMIN in the last 168 hours. No results for input(s): LIPASE, AMYLASE in the last 168 hours. No results for input(s): AMMONIA in the last 168 hours. CBC:  Recent Labs Lab 11/11/15 1615 11/12/15 0225  WBC 6.9 6.5  HGB 11.1* 10.2*  HCT 36.0 33.6*  MCV 80.9 82.4  PLT 279 264   Cardiac Enzymes:  Recent Labs Lab 11/12/15 0225 11/12/15 0521  TROPONINI <0.03 <0.03   BNP (last 3 results)  Recent Labs  11/04/15 1239 11/11/15 2014  BNP 164.2* 130.3*    ProBNP (last 3 results) No results for input(s): PROBNP in the last 8760 hours.  CBG:  Recent Labs Lab 11/12/15 0322 11/12/15 0657  GLUCAP 271* 285*    No results found for this or any previous visit (from the past 240 hour(s)).   Studies: Ct Angio Chest W/cm &/or Wo Cm  11/11/2015  CLINICAL DATA:  Acute on chronic cough with nausea shortness of breath for 1 week bilateral lower extremity pain and swelling EXAM: CT ANGIOGRAPHY CHEST WITH CONTRAST TECHNIQUE: Multidetector CT imaging of the chest was performed using the standard protocol during bolus administration of intravenous contrast. Multiplanar CT image reconstructions and MIPs were obtained to evaluate the vascular anatomy. CONTRAST:  167mL OMNIPAQUE IOHEXOL 350 MG/ML SOLN COMPARISON:  11/04/2015, 12/22/2010 FINDINGS:  There is mild diffuse patchy bilateral ground-glass attenuation. No pleural effusion. No consolidation. Thoracic aorta is not dilated. The heart is mildly enlarged. No significant pericardial effusion. Enlarged mediastinal lymph nodes including 9 mm AP window lymph node. Right hilar lymph node measures 11 mm. Subcarinal adenopathy measures 15 mm. Suboptimal evaluation of third order pulmonary arterial branches due to patient body habitus. Cannot exclude the possibility of peripheral pulmonary emboli therefore. This study is sufficient to exclude emboli in the main pulmonary artery,  right and left main pulmonary arteries, and perihilar branches. No acute findings in the upper abdomen. No acute musculoskeletal findings. Review of the MIP images confirms the above findings. IMPRESSION: Increased mediastinal adenopathy and hilar adenopathy and increased ground-glass patchy a attenuation in both lungs when compared to prior CT scan 12/22/2010. Findings suggest possibility of opportunistic infection. Other possibilities including eosinophilic pneumonia, hypersensitivity pneumonitis, or chronic interstitial pneumonia. Limited study regarding the possibility of pulmonary emboli but there are no large central emboli detected. Electronically Signed   By: Skipper Cliche M.D.   On: 11/11/2015 16:06    Scheduled Meds: . amLODipine  10 mg Oral Daily  . benzonatate  100 mg Oral TID  . chlorpheniramine-HYDROcodone  5 mL Oral Q12H  . enoxaparin (LOVENOX) injection  40 mg Subcutaneous Q24H  . fluticasone  2 spray Each Nare Daily  . furosemide  80 mg Oral BID  . insulin aspart  0-15 Units Subcutaneous TID WC  . insulin aspart  0-5 Units Subcutaneous QHS  . ipratropium  0.5 mg Nebulization Q6H  . losartan  100 mg Oral Daily  . metoprolol succinate  50 mg Oral Daily  . potassium chloride  20 mEq Oral BID  . rosuvastatin  10 mg Oral q1800   Continuous Infusions:  Antibiotics Given (last 72 hours)    None      Active Problems:   OSA (obstructive sleep apnea)- non compliant with C-pap   Chronic diastolic congestive heart failure ([facility])   HTN (hypertension)   Diabetes ([facility])   Acute diastolic CHF (congestive heart failure) ([facility])   Abnormal CT scan, chest   Chest pain   Atypical chest pain    Time spent:80min    [facility]  [facility] Pager [phone]. If 7PM-7AM, please contact night-coverage at [email or web address], password [facility] 11/12/2015, 9:36 AM

## 2015-11-12 NOTE — Progress Notes (Signed)
Inpatient Diabetes Program Recommendations  AACE/ADA: New Consensus Statement on Inpatient Glycemic Control (2015)  Target Ranges:  Prepandial:   less than 140 mg/dL      Peak postprandial:   less than 180 mg/dL (1-2 hours)      Critically ill patients:  140 - 180 mg/dL   Review of Glycemic Control:  Results for JAMEE, LOBATOS (MRN [id number]) as of 11/12/2015 08:54  Ref. Range 11/04/2015 14:41 11/12/2015 03:22 11/12/2015 06:57  Glucose-Capillary Latest Ref Range: 65-99 mg/dL 193 (H) 271 (H) 285 (H)  Results for INDEE, GRZESKOWIAK (MRN [id number]) as of 11/12/2015 08:54  Ref. Range 10/05/2015 04:22  Hemoglobin A1C Latest Ref Range: 4.8-5.6 % 12.1 (H)    Diabetes history: Type 2 diabetes Outpatient Diabetes medications: Glipizide 5 mg daily, Metformin 1000 mg bid Current orders for Inpatient glycemic control:  Novolog moderate tid with meals and HS  Inpatient Diabetes Program Recommendations:     Blood sugars greater than goal.  It appears that she would benefit from the addition of insulin.  Consider Levemir 25 units daily while in the hospital.  Patient will need follow-up at [facility] regarding elevated A1C.  Thanks, [name], RN, BC-ADM Inpatient Diabetes Coordinator Pager [phone] (8a-5p)

## 2015-11-13 DIAGNOSIS — R590 Localized enlarged lymph nodes: Secondary | ICD-10-CM

## 2015-11-13 DIAGNOSIS — R599 Enlarged lymph nodes, unspecified: Secondary | ICD-10-CM

## 2015-11-13 DIAGNOSIS — I5033 Acute on chronic diastolic (congestive) heart failure: Secondary | ICD-10-CM | POA: Diagnosis present

## 2015-11-13 DIAGNOSIS — R0600 Dyspnea, unspecified: Secondary | ICD-10-CM | POA: Diagnosis present

## 2015-11-13 LAB — ANGIOTENSIN CONVERTING ENZYME: ANGIOTENSIN-CONVERTING ENZYME: 38 U/L (ref 14–82)

## 2015-11-13 LAB — GLUCOSE, CAPILLARY
GLUCOSE-CAPILLARY: 281 mg/dL — AB (ref 65–99)
Glucose-Capillary: 212 mg/dL — ABNORMAL HIGH (ref 65–99)
Glucose-Capillary: 235 mg/dL — ABNORMAL HIGH (ref 65–99)
Glucose-Capillary: 210 mg/dL — ABNORMAL HIGH (ref 65–99)

## 2015-11-13 LAB — HEMOGLOBIN A1C
Hgb A1c MFr Bld: 11.7 % — ABNORMAL HIGH (ref 4.8–5.6)
Mean Plasma Glucose: 289 mg/dL

## 2015-11-13 LAB — LEGIONELLA ANTIGEN, URINE

## 2015-11-13 LAB — ANTINUCLEAR ANTIBODIES, IFA: ANTINUCLEAR ANTIBODIES, IFA: NEGATIVE

## 2015-11-13 MED ORDER — INSULIN DETEMIR 100 UNIT/ML ~~LOC~~ SOLN
5.0000 [IU] | Freq: Two times a day (BID) | SUBCUTANEOUS | Status: DC
  Administered 2015-11-13: 5 [IU] via SUBCUTANEOUS
  Filled 2015-11-13 (×2): qty 0.05

## 2015-11-13 MED ORDER — PANTOPRAZOLE SODIUM 40 MG PO TBEC
40.0000 mg | DELAYED_RELEASE_TABLET | Freq: Every day | ORAL | Status: DC
  Administered 2015-11-13 – 2015-11-21 (×9): 40 mg via ORAL
  Filled 2015-11-13 (×9): qty 1

## 2015-11-13 MED ORDER — FUROSEMIDE 10 MG/ML IJ SOLN
80.0000 mg | Freq: Two times a day (BID) | INTRAMUSCULAR | Status: DC
  Administered 2015-11-13 – 2015-11-14 (×4): 80 mg via INTRAVENOUS
  Filled 2015-11-13 (×4): qty 8

## 2015-11-13 MED ORDER — INSULIN DETEMIR 100 UNIT/ML ~~LOC~~ SOLN
15.0000 [IU] | Freq: Two times a day (BID) | SUBCUTANEOUS | Status: DC
  Administered 2015-11-13: 15 [IU] via SUBCUTANEOUS
  Filled 2015-11-13 (×3): qty 0.15

## 2015-11-13 MED ORDER — SODIUM CHLORIDE 3 % IN NEBU
5.0000 mL | INHALATION_SOLUTION | Freq: Three times a day (TID) | RESPIRATORY_TRACT | Status: DC
  Administered 2015-11-13: 5 mL via RESPIRATORY_TRACT
  Filled 2015-11-13 (×3): qty 8

## 2015-11-13 MED ORDER — RISPERIDONE 1 MG PO TABS
4.0000 mg | ORAL_TABLET | Freq: Every day | ORAL | Status: DC
  Administered 2015-11-13 – 2015-11-21 (×9): 4 mg via ORAL
  Filled 2015-11-13 (×9): qty 4

## 2015-11-13 MED ORDER — MIRTAZAPINE 15 MG PO TABS
30.0000 mg | ORAL_TABLET | Freq: Every day | ORAL | Status: DC
  Administered 2015-11-13 – 2015-11-20 (×8): 30 mg via ORAL
  Filled 2015-11-13 (×9): qty 2

## 2015-11-13 NOTE — Progress Notes (Signed)
Name: [name] MRN: [id number] DOB: Mar 22, 1966    ADMISSION DATE:  11/11/2015 CONSULTATION DATE:  11/11/15  REFERRING MD :  Dr. Roel Cluck  CHIEF COMPLAINT:  Abnormal Chest ct scan   SUBJECTIVE: Doing well this morning. No new complaints. Still coughing.  VITAL SIGNS: Temp:  [97.2 F (36.2 C)-98.2 F (36.8 C)] 97.6 F (36.4 C) (02/15 0500) Pulse Rate:  [65-79] 78 (02/15 0500) Resp:  [18-22] 20 (02/15 0500) BP: (122-169)/(72-89) 138/72 mmHg (02/15 0500) SpO2:  [96 %-100 %] 100 % (02/15 0914) Weight:  [361 lb 8.2 oz (163.98 kg)] 361 lb 8.2 oz (163.98 kg) (02/15 0500)  PHYSICAL EXAMINATION:  Vitals:  Filed Vitals:   11/12/15 2050 11/12/15 2342 11/13/15 0500 11/13/15 0914  BP: 144/78 122/75 138/72   Pulse: 76 76 78   Temp: 97.2 F (36.2 C)  97.6 F (36.4 C)   TempSrc: Axillary  Oral   Resp: 18 18 20    Height:      Weight:   361 lb 8.2 oz (163.98 kg)   SpO2: 96% 98% 100% 100%    Constitutional/General:  NAD HEENT: Anicteric sclerae. Normal nasal mucosa. No oral  lesions,  mouth clear,  oropharynx clear. Neck: No masses. Midline trachea. Respiratory/Chest: Clear bilaterally Cardiovascular: Regular rate and  rhythm, heart sounds normal Gastrointestinal:  Normal bowel sounds. Soft, non-tender.  Musculoskeletal:  Normal muscle tone. Extremities: Grossly normal. 2+ edema Skin: No rash/lesions Neuro: Alert, oriented to time, place, person.    Recent Labs Lab 11/11/15 1615 11/12/15 0225  NA 136  --   K 4.4  --   CL 97*  --   CO2 26  --   BUN 6  --   CREATININE 0.51 0.61  GLUCOSE 173*  --     Recent Labs Lab 11/11/15 1615 11/12/15 0225  HGB 11.1* 10.2*  HCT 36.0 33.6*  WBC 6.9 6.5  PLT 279 264   Ct Angio Chest W/cm &/or Wo Cm  11/11/2015  CLINICAL DATA:  Acute on chronic cough with nausea shortness of breath for 1 week bilateral lower extremity pain and swelling EXAM: CT ANGIOGRAPHY CHEST WITH CONTRAST TECHNIQUE: Multidetector CT imaging of the chest  was performed using the standard protocol during bolus administration of intravenous contrast. Multiplanar CT image reconstructions and MIPs were obtained to evaluate the vascular anatomy. CONTRAST:  131mL OMNIPAQUE IOHEXOL 350 MG/ML SOLN COMPARISON:  11/04/2015, 12/22/2010 FINDINGS: There is mild diffuse patchy bilateral ground-glass attenuation. No pleural effusion. No consolidation. Thoracic aorta is not dilated. The heart is mildly enlarged. No significant pericardial effusion. Enlarged mediastinal lymph nodes including 9 mm AP window lymph node. Right hilar lymph node measures 11 mm. Subcarinal adenopathy measures 15 mm. Suboptimal evaluation of third order pulmonary arterial branches due to patient body habitus. Cannot exclude the possibility of peripheral pulmonary emboli therefore. This study is sufficient to exclude emboli in the main pulmonary artery, right and left main pulmonary arteries, and perihilar branches. No acute findings in the upper abdomen. No acute musculoskeletal findings. Review of the MIP images confirms the above findings. IMPRESSION: Increased mediastinal adenopathy and hilar adenopathy and increased ground-glass patchy a attenuation in both lungs when compared to prior CT scan 12/22/2010. Findings suggest possibility of opportunistic infection. Other possibilities including eosinophilic pneumonia, hypersensitivity pneumonitis, or chronic interstitial pneumonia. Limited study regarding the possibility of pulmonary emboli but there are no large central emboli detected. Electronically Signed   By: Skipper Cliche M.D.   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 Patient has significant comorbidities which include untreated sleep apnea, diastolic heart failure. Recent respiratory infection treated with Levaquin in  January. Chest CTA scan showed no filling defects, poor inspiratory effort, mediastinal and hilar lymphadenopathy, bilateral patchy groundglass like infiltrates. She had a chest CT scan in 2012 which looks similar but the lymph nodes were not as prominent.   Abnormal chest CTA: Differential includes underlying pulmonary venous congestion, diastolic heart failure, possible infection (atypical infection, pneumonia, TB, MAC), upper airway congestion/upper airway cough syndrome, aspiration pneumonitis, inflammatory. HIV ab nonreactive. BCx NGTD. -Isolation for TB, contact precautions. -Hold off on bronch for now. -TB quantiferon pending -Sputum culture for AFB smear and culture x 3 -Sputum gram stain and culture -Urine strep and Legionella pending -Budesonide neb BID and Duoneb QID. -IS, flutter valve -Consider lower extremity venous duplex  Acute on chronic diastolic heart failure: Echo 10/04/2015 with EF 60-65%. -Diuresis per primary  OSA -Will need sleep study as outpatient  [facility] -Will need to work on weight loss as outpatient  GERD: -PPI  Jacques Earthly, MD  Internal Medicine PGY-2  [facility] [place] Pager : [phone] 11/13/2015, 9:34 AM

## 2015-11-13 NOTE — Progress Notes (Signed)
Per MD order, sputum induction sample sent to LAB

## 2015-11-13 NOTE — Progress Notes (Signed)
Called respiratory for sputum induction per pulmonologist x 2. Awaiting collection.

## 2015-11-13 NOTE — Progress Notes (Addendum)
[facility] PROGRESS NOTE    Progress Note   [name]:[id number] DOB: 1966/08/04 DOA: 11/11/2015 PCP: [name], MD   Brief Narrative:   [name] is an 50 y.o. female   Assessment/Plan:   Dyspnea and mediastinal adenopathy and ground glass changes: CT imaging of the chest done on 11/11/2015 showed multiple hilar adenopathy with groundglass appearance. AFP and serum quad furuncle depending. She is not inferior pulmonary solution. Pulmonary was consulted, her ESR is 87 HIV is negative Ace level is pending LDH is mildly high. Pulmonary recommended a bronchoscopy, blood cultures and urine Legionella, incentive spirometry. Also in the differential for CT scan findings is volume overload. She does relate she is noncompliant with her Lasix.  Acute on Chronic diastolic congestive heart failure ([name]): She was recently discharged from the hospital on 10/06/2015 for acute decompensated diastolic heart failure, at that time her weight was 158.8 kg on admission she weighed 164.2 kg. There is a clear history documented in the chart about noncompliant with her Lasix. Cardiac markers negative 3. Change her Lasix to IV  OSA (obstructive sleep apnea)- non compliant with C-pap Noncompliant with her C-Pap, she refused it at night.    Essential HTN (hypertension): Fairly controlled, will continue IV Lasix of only her weight will continue to trend down.  Poorly controlled Diabetes ([place]) Her A1c was 11, which make his strong argument about her compliance. Start Levemir twice a day.  Bipolar disorder 1: Resume seroque land remeron. Currently stable.  DVT Prophylaxis - Lovenox ordered.  Family Communication: none Disposition Plan: Home 4-5 days Code Status:     Code Status Orders        Start     Ordered   11/12/15 0215  Full code   Continuous     11/12/15 0214    Code Status History    Date Active Date Inactive Code Status Order ID Comments User  Context   10/04/2015 12:27 AM 10/06/2015  5:21 PM Full Code [id number]  [name], MD ED   12/06/2013 11:52 PM 12/10/2013  2:33 PM Full Code [id number]  [name], MD ED   11/03/2013 12:19 AM 11/07/2013  7:23 PM Full Code [id number]  [name], MD Inpatient   06/14/2012 11:22 PM 06/15/2012  8:32 PM Full Code [id number]  [name], MD ED        IV Access:    Peripheral IV   Procedures and diagnostic studies:   Ct Angio Chest W/cm &/or Wo Cm  11/11/2015  CLINICAL DATA:  Acute on chronic cough with nausea shortness of breath for 1 week bilateral lower extremity pain and swelling EXAM: CT ANGIOGRAPHY CHEST WITH CONTRAST TECHNIQUE: Multidetector CT imaging of the chest was performed using the standard protocol during bolus administration of intravenous contrast. Multiplanar CT image reconstructions and MIPs were obtained to evaluate the vascular anatomy. CONTRAST:  129m OMNIPAQUE IOHEXOL 350 MG/ML SOLN COMPARISON:  11/04/2015, 12/22/2010 FINDINGS: There is mild diffuse patchy bilateral ground-glass attenuation. No pleural effusion. No consolidation. Thoracic aorta is not dilated. The heart is mildly enlarged. No significant pericardial effusion. Enlarged mediastinal lymph nodes including 9 mm AP window lymph node. Right hilar lymph node measures 11 mm. Subcarinal adenopathy measures 15 mm. Suboptimal evaluation of third order pulmonary arterial branches due to patient body habitus. Cannot exclude the possibility of peripheral pulmonary emboli therefore. This study is sufficient to exclude emboli in the main pulmonary artery, right and left main pulmonary arteries, and perihilar branches. No acute findings  in the upper abdomen. No acute musculoskeletal findings. Review of the MIP images confirms the above findings. IMPRESSION: Increased mediastinal adenopathy and hilar adenopathy and increased ground-glass patchy a attenuation in both lungs when compared to prior CT scan 12/22/2010. Findings  suggest possibility of opportunistic infection. Other possibilities including eosinophilic pneumonia, hypersensitivity pneumonitis, or chronic interstitial pneumonia. Limited study regarding the possibility of pulmonary emboli but there are no large central emboli detected. Electronically Signed   By: Skipper Cliche M.D.   On: 11/11/2015 16:06     Medical Consultants:    None.  Anti-Infectives:   Anti-infectives    Start     Dose/Rate Route Frequency Ordered Stop   11/12/15 0800  vancomycin (VANCOCIN) 1,500 mg in sodium chloride 0.9 % 500 mL IVPB  Status:  Discontinued     1,500 mg 250 mL/hr over 120 Minutes Intravenous Every 8 hours 11/11/15 2154 11/12/15 0214   11/11/15 2200  vancomycin (VANCOCIN) 2,500 mg in sodium chloride 0.9 % 500 mL IVPB     2,500 mg 250 mL/hr over 120 Minutes Intravenous  Once 11/11/15 2147 11/12/15 0022   11/11/15 2145  ceFEPIme (MAXIPIME) 2 g in dextrose 5 % 50 mL IVPB     2 g 100 mL/hr over 30 Minutes Intravenous  Once 11/11/15 2144 11/12/15 0030      Subjective:    [name] she relates her shortness of breath is unchanged. Denies any cough.  Objective:    Filed Vitals:   11/12/15 2030 11/12/15 2050 11/12/15 2342 11/13/15 0500  BP:  144/78 122/75 138/72  Pulse: 65 76 76 78  Temp:  97.2 F (36.2 C)  97.6 F (36.4 C)  TempSrc:  Axillary  Oral  Resp: _0 Height:      Weight:    163.98 kg (361 lb 8.2 oz)  SpO2: 99% 96% 98% 100%    Intake/Output Summary (Last 24 hours) at 11/13/15 0858 Last data filed at 11/13/15 0600  Gross per 24 hour  Intake    720 ml  Output      0 ml  Net    720 ml   Filed Weights   11/12/15 0216 11/13/15 0500  Weight: 164.2 kg (361 lb 15.9 oz) 163.98 kg (361 lb 8.2 oz)    Exam: Gen:  NAD, morbidly obese Cardiovascular:  RRR. Chest and lungs:   Good air movement clear to auscultation  Abdomen:  Abdomen soft, NT/ND, + BS Extremities:  1+ lower extremity edema   Data Reviewed:     Labs: Basic Metabolic Panel:  Recent Labs Lab 11/11/15 1615 11/12/15 0225  NA 136  --   K 4.4  --   CL 97*  --   CO2 26  --   GLUCOSE 173*  --   BUN 6  --   CREATININE 0.51 0.61  CALCIUM 9.0  --    GFR Estimated Creatinine Clearance: 140.8 mL/min (by C-G formula based on Cr of 0.61). Liver Function Tests: No results for input(s): AST, ALT, ALKPHOS, BILITOT, PROT, ALBUMIN in the last 168 hours. No results for input(s): LIPASE, AMYLASE in the last 168 hours. No results for input(s): AMMONIA in the last 168 hours. Coagulation profile No results for input(s): INR, PROTIME in the last 168 hours.  CBC:  Recent Labs Lab 11/11/15 1615 11/12/15 0225  WBC 6.9 6.5  HGB 11.1* 10.2*  HCT 36.0 33.6*  MCV 80.9 82.4  PLT 279 264   Cardiac Enzymes:  Recent Labs  Lab 11/12/15 0225 11/12/15 0521  TROPONINI <0.03 <0.03   BNP (last 3 results) No results for input(s): PROBNP in the last 8760 hours. CBG:  Recent Labs Lab 11/12/15 0657 11/12/15 1135 11/12/15 1719 11/12/15 2049 11/13/15 0610  GLUCAP 285* 249* 282* 227* 212*   D-Dimer: No results for input(s): DDIMER in the last 72 hours. Hgb A1c:  Recent Labs  11/12/15 0225  HGBA1C 11.7*   Lipid Profile: No results for input(s): CHOL, HDL, LDLCALC, TRIG, CHOLHDL, LDLDIRECT in the last 72 hours. Thyroid function studies: No results for input(s): TSH, T4TOTAL, T3FREE, THYROIDAB in the last 72 hours.  Invalid input(s): FREET3 Anemia work up: No results for input(s): VITAMINB12, FOLATE, FERRITIN, TIBC, IRON, RETICCTPCT in the last 72 hours. Sepsis Labs:  Recent Labs Lab 11/11/15 1615 11/12/15 0225  WBC 6.9 6.5   Microbiology Recent Results (from the past 240 hour(s))  Blood culture (routine x 2)     Status: None (Preliminary result)   Collection Time: 11/11/15  9:56 PM  Result Value Ref Range Status   Specimen Description BLOOD RIGHT HAND  Final   Special Requests BOTTLES DRAWN AEROBIC AND ANAEROBIC  5CC  Final   Culture NO GROWTH < 24 HOURS  Final   Report Status PENDING  Incomplete  Blood culture (routine x 2)     Status: None (Preliminary result)   Collection Time: 11/11/15 10:07 PM  Result Value Ref Range Status   Specimen Description BLOOD LEFT HAND  Final   Special Requests BOTTLES DRAWN AEROBIC AND ANAEROBIC 5CC  Final   Culture NO GROWTH < 24 HOURS  Final   Report Status PENDING  Incomplete  Rapid strep screen (not at [facility])     Status: None   Collection Time: 11/12/15  2:30 PM  Result Value Ref Range Status   Streptococcus, Group A Screen (Direct) NEGATIVE NEGATIVE Final    Comment: (NOTE) A Rapid Antigen test may result negative if the antigen level in the sample is below the detection level of this test. The FDA has not cleared this test as a stand-alone test therefore the rapid antigen negative result has reflexed to a Group A Strep culture.   MRSA PCR Screening     Status: None   Collection Time: 11/12/15  2:30 PM  Result Value Ref Range Status   MRSA by PCR NEGATIVE NEGATIVE Final    Comment:        The GeneXpert MRSA Assay (FDA approved for NASAL specimens only), is one component of a comprehensive MRSA colonization surveillance program. It is not intended to diagnose MRSA infection nor to guide or monitor treatment for MRSA infections.      Medications:   . amLODipine  10 mg Oral Daily  . benzonatate  100 mg Oral TID  . budesonide (PULMICORT) nebulizer solution  0.5 mg Nebulization BID  . chlorpheniramine-HYDROcodone  5 mL Oral Q12H  . enoxaparin (LOVENOX) injection  40 mg Subcutaneous Q24H  . fluticasone  2 spray Each Nare Daily  . furosemide  80 mg Oral BID  . insulin aspart  0-15 Units Subcutaneous TID WC  . insulin aspart  0-5 Units Subcutaneous QHS  . ipratropium-albuterol  3 mL Nebulization QID  . losartan  100 mg Oral Daily  . metoprolol succinate  50 mg Oral Daily  . potassium chloride  20 mEq Oral BID  . rosuvastatin  10 mg Oral q1800    Continuous Infusions:   Time spent: 25 min     FELIZ  Marguarite Arbour  [facility] Pager [phone]  *Please refer to [email or web address], password [email or web address] to get updated schedule on who will round on this patient, as hospitalists switch teams weekly. If 7PM-7AM, please contact night-coverage at [email or web address], password [email or web address] for any overnight needs.  11/13/2015, 8:58 AM

## 2015-11-14 DIAGNOSIS — I5033 Acute on chronic diastolic (congestive) heart failure: Secondary | ICD-10-CM

## 2015-11-14 LAB — GLUCOSE, CAPILLARY
GLUCOSE-CAPILLARY: 207 mg/dL — AB (ref 65–99)
GLUCOSE-CAPILLARY: 271 mg/dL — AB (ref 65–99)
GLUCOSE-CAPILLARY: 253 mg/dL — AB (ref 65–99)
GLUCOSE-CAPILLARY: 330 mg/dL — AB (ref 65–99)
Glucose-Capillary: 257 mg/dL — ABNORMAL HIGH (ref 65–99)

## 2015-11-14 LAB — EXPECTORATED SPUTUM ASSESSMENT W GRAM STAIN, RFLX TO RESP C

## 2015-11-14 LAB — STREP PNEUMONIAE URINARY ANTIGEN: STREP PNEUMO URINARY ANTIGEN: NEGATIVE

## 2015-11-14 LAB — BASIC METABOLIC PANEL
Anion gap: 8 (ref 5–15)
BUN: 12 mg/dL (ref 6–20)
CALCIUM: 8.9 mg/dL (ref 8.9–10.3)
CO2: 31 mmol/L (ref 22–32)
CREATININE: 0.71 mg/dL (ref 0.44–1.00)
Chloride: 98 mmol/L — ABNORMAL LOW (ref 101–111)
GFR calc Af Amer: >60 mL/min (ref >60)
GLUCOSE: 240 mg/dL — AB (ref 65–99)
Potassium: 4.3 mmol/L (ref 3.5–5.1)
Sodium: 137 mmol/L (ref 135–145)

## 2015-11-14 MED ORDER — ENOXAPARIN SODIUM 80 MG/0.8ML ~~LOC~~ SOLN
0.5000 mg/kg | Freq: Every day | SUBCUTANEOUS | Status: DC
  Administered 2015-11-14 – 2015-11-21 (×8): 80 mg via SUBCUTANEOUS
  Filled 2015-11-14 (×8): qty 0.8

## 2015-11-14 MED ORDER — INSULIN DETEMIR 100 UNIT/ML ~~LOC~~ SOLN
20.0000 [IU] | Freq: Two times a day (BID) | SUBCUTANEOUS | Status: DC
  Administered 2015-11-14 (×2): 20 [IU] via SUBCUTANEOUS
  Filled 2015-11-14 (×3): qty 0.2

## 2015-11-14 MED ORDER — SODIUM CHLORIDE 3 % IN NEBU
5.0000 mL | INHALATION_SOLUTION | Freq: Three times a day (TID) | RESPIRATORY_TRACT | Status: AC
  Administered 2015-11-14: 5 mL via RESPIRATORY_TRACT
  Filled 2015-11-14 (×2): qty 8

## 2015-11-14 NOTE — Progress Notes (Signed)
[facility] PROGRESS NOTE    Progress Note   [name] PJA:[id number] DOB: [birth year] DOA: 11/11/2015 PCP: [name], MD   Brief Narrative:   [name] is an 50 y.o. female   Assessment/Plan:   Dyspnea and mediastinal adenopathy and ground glass changes: CT imaging of the chest done on 11/11/2015 showed multiple hilar adenopathy with groundglass appearance. AFP and serum Quanteferon gold ispending. . Pulmonary was consulted, her ESR is 87 HIV is negative Ace level is pending. Pulmonary recommended a bronchoscopy AFTER DIURESIS, blood cultures and urine Legionella negative. Also in the differential for CT scan findings is volume overload. She does relate she is noncompliant with her Lasix.  Acute on Chronic diastolic congestive heart failure ([place]): She was recently discharged from the hospital on 10/06/2015 for acute decompensated diastolic heart failure, at that time her weight was 158.8 kg on admission she weighed 164.2 kg. There is a clear history documented in the chart about noncompliant with her Lasix. Urine output, her blood pressures remain stable continue to fluid restrict her, continue monitor her electrolytes. Continue metoprolol and ARB.  OSA (obstructive sleep apnea)- non compliant with C-pap Noncompliant with her C-Pap, she refused it at night.    Essential HTN (hypertension): Fairly controlled, blood pressure 90 to improve his diuresis.  Poorly controlled Diabetes ([place]) Her A1c was 11, which make his strong argument about her compliance. Increase Levemir to 15 units twice a day.  Bipolar disorder 1: Resume seroque land remeron. Currently stable.  DVT Prophylaxis - Lovenox ordered.  Family Communication: none Disposition Plan: Home 4  days Code Status:     Code Status Orders        Start     Ordered   11/12/15 0215  Full code   Continuous     11/12/15 0214    Code Status History    Date Active Date Inactive Code Status  Order ID Comments User Context   10/04/2015 12:27 AM 10/06/2015  5:21 PM Full Code [id number]  [name], MD ED   12/06/2013 11:52 PM 12/10/2013  2:33 PM Full Code [id number]  [name], MD ED   11/03/2013 12:19 AM 11/07/2013  7:23 PM Full Code [id number]  [name], MD Inpatient   06/14/2012 11:22 PM 06/15/2012  8:32 PM Full Code [id number]  [name], MD ED        IV Access:    Peripheral IV   Procedures and diagnostic studies:   No results found.   Medical Consultants:    None.  Anti-Infectives:   Anti-infectives    Start     Dose/Rate Route Frequency Ordered Stop   11/12/15 0800  vancomycin (VANCOCIN) 1,500 mg in sodium chloride 0.9 % 500 mL IVPB  Status:  Discontinued     1,500 mg 250 mL/hr over 120 Minutes Intravenous Every 8 hours 11/11/15 2154 11/12/15 0214   11/11/15 2200  vancomycin (VANCOCIN) 2,500 mg in sodium chloride 0.9 % 500 mL IVPB     2,500 mg 250 mL/hr over 120 Minutes Intravenous  Once 11/11/15 2147 11/12/15 0022   11/11/15 2145  ceFEPIme (MAXIPIME) 2 g in dextrose 5 % 50 mL IVPB     2 g 100 mL/hr over 30 Minutes Intravenous  Once 11/11/15 2144 11/12/15 0030      Subjective:    [name] she was shortness of breath is better. Cannot tolerate TED hose.  Objective:    Filed Vitals:   11/13/15 1741 11/13/15 2003 11/13/15 2032 11/14/15 9417  BP:  136/76  131/53  Pulse:  88 65 82  Temp:  98.4 F (36.9 C)  97.7 F (36.5 C)  TempSrc:  Oral  Oral  Resp:  _0 Height:      Weight:    162.841 kg (359 lb)  SpO2: 100% 98% 100% 98%    Intake/Output Summary (Last 24 hours) at 11/14/15 0820 Last data filed at 11/14/15 0653  Gross per 24 hour  Intake   1180 ml  Output   5050 ml  Net  -3870 ml   Filed Weights   11/12/15 0216 11/13/15 0500 11/14/15 0418  Weight: 164.2 kg (361 lb 15.9 oz) 163.98 kg (361 lb 8.2 oz) 162.841 kg (359 lb)    Exam: Gen:  NAD, morbidly obese Cardiovascular:  RRR. Chest and lungs:   Good air  movement clear to auscultation  Abdomen:  Abdomen soft, NT/ND, + BS Extremities:  2+ lower extremity edema   Data Reviewed:    Labs: Basic Metabolic Panel:  Recent Labs Lab 11/11/15 1615 11/12/15 0225 11/14/15 0520  NA 136  --  137  K 4.4  --  4.3  CL 97*  --  98*  CO2 26  --  31  GLUCOSE 173*  --  240*  BUN 6  --  12  CREATININE 0.51 0.61 0.71  CALCIUM 9.0  --  8.9   GFR Estimated Creatinine Clearance: 140.1 mL/min (by C-G formula based on Cr of 0.71). Liver Function Tests: No results for input(s): AST, ALT, ALKPHOS, BILITOT, PROT, ALBUMIN in the last 168 hours. No results for input(s): LIPASE, AMYLASE in the last 168 hours. No results for input(s): AMMONIA in the last 168 hours. Coagulation profile No results for input(s): INR, PROTIME in the last 168 hours.  CBC:  Recent Labs Lab 11/11/15 1615 11/12/15 0225  WBC 6.9 6.5  HGB 11.1* 10.2*  HCT 36.0 33.6*  MCV 80.9 82.4  PLT 279 264   Cardiac Enzymes:  Recent Labs Lab 11/12/15 0225 11/12/15 0521  TROPONINI <0.03 <0.03   BNP (last 3 results) No results for input(s): PROBNP in the last 8760 hours. CBG:  Recent Labs Lab 11/13/15 0610 11/13/15 1130 11/13/15 1701 11/13/15 2039 11/14/15 0554  GLUCAP 212* 281* 235* 210* 207*   D-Dimer: No results for input(s): DDIMER in the last 72 hours. Hgb A1c:  Recent Labs  11/12/15 0225  HGBA1C 11.7*   Lipid Profile: No results for input(s): CHOL, HDL, LDLCALC, TRIG, CHOLHDL, LDLDIRECT in the last 72 hours. Thyroid function studies: No results for input(s): TSH, T4TOTAL, T3FREE, THYROIDAB in the last 72 hours.  Invalid input(s): FREET3 Anemia work up: No results for input(s): VITAMINB12, FOLATE, FERRITIN, TIBC, IRON, RETICCTPCT in the last 72 hours. Sepsis Labs:  Recent Labs Lab 11/11/15 1615 11/12/15 0225  WBC 6.9 6.5   Microbiology Recent Results (from the past 240 hour(s))  Blood culture (routine x 2)     Status: None (Preliminary result)     Collection Time: 11/11/15  9:56 PM  Result Value Ref Range Status   Specimen Description BLOOD RIGHT HAND  Final   Special Requests BOTTLES DRAWN AEROBIC AND ANAEROBIC 5CC  Final   Culture NO GROWTH 2 DAYS  Final   Report Status PENDING  Incomplete  Blood culture (routine x 2)     Status: None (Preliminary result)   Collection Time: 11/11/15 10:07 PM  Result Value Ref Range Status   Specimen Description BLOOD LEFT HAND  Final  Special Requests BOTTLES DRAWN AEROBIC AND ANAEROBIC 5CC  Final   Culture NO GROWTH 2 DAYS  Final   Report Status PENDING  Incomplete  Rapid strep screen (not at [facility])     Status: None   Collection Time: 11/12/15  2:30 PM  Result Value Ref Range Status   Streptococcus, Group A Screen (Direct) NEGATIVE NEGATIVE Final    Comment: (NOTE) A Rapid Antigen test may result negative if the antigen level in the sample is below the detection level of this test. The FDA has not cleared this test as a stand-alone test therefore the rapid antigen negative result has reflexed to a Group A Strep culture.   MRSA PCR Screening     Status: None   Collection Time: 11/12/15  2:30 PM  Result Value Ref Range Status   MRSA by PCR NEGATIVE NEGATIVE Final    Comment:        The GeneXpert MRSA Assay (FDA approved for NASAL specimens only), is one component of a comprehensive MRSA colonization surveillance program. It is not intended to diagnose MRSA infection nor to guide or monitor treatment for MRSA infections.   Culture, group A strep     Status: None (Preliminary result)   Collection Time: 11/12/15  2:30 PM  Result Value Ref Range Status   Specimen Description THROAT  Final   Special Requests NONE Reflexed from [id number]  Final   Culture CULTURE REINCUBATED FOR BETTER GROWTH  Final   Report Status PENDING  Incomplete  Culture, expectorated sputum-assessment     Status: None   Collection Time: 11/13/15 10:39 PM  Result Value Ref Range Status   Specimen Description  EXPECTORATED SPUTUM  Final   Special Requests NONE  Final   Sputum evaluation   Final    THIS SPECIMEN IS ACCEPTABLE. RESPIRATORY CULTURE REPORT TO FOLLOW.   Report Status 11/14/2015 FINAL  Final     Medications:   . amLODipine  10 mg Oral Daily  . benzonatate  100 mg Oral TID  . budesonide (PULMICORT) nebulizer solution  0.5 mg Nebulization BID  . chlorpheniramine-HYDROcodone  5 mL Oral Q12H  . enoxaparin (LOVENOX) injection  40 mg Subcutaneous Q24H  . fluticasone  2 spray Each Nare Daily  . furosemide  80 mg Intravenous Q12H  . insulin aspart  0-15 Units Subcutaneous TID WC  . insulin aspart  0-5 Units Subcutaneous QHS  . insulin detemir  15 Units Subcutaneous BID  . ipratropium-albuterol  3 mL Nebulization QID  . losartan  100 mg Oral Daily  . metoprolol succinate  50 mg Oral Daily  . mirtazapine  30 mg Oral QHS  . pantoprazole  40 mg Oral Daily  . potassium chloride  20 mEq Oral BID  . risperidone  4 mg Oral Daily  . rosuvastatin  10 mg Oral q1800   Continuous Infusions:   Time spent: 25 min   LOS: 1 day   Charlynne Cousins  [facility] Pager [phone]  *Please refer to [email or web address], password [email or web address] to get updated schedule on who will round on this patient, as hospitalists switch teams weekly. If 7PM-7AM, please contact night-coverage at [email or web address], password [email or web address] for any overnight needs.  11/14/2015, 8:20 AM

## 2015-11-14 NOTE — Progress Notes (Signed)
   Name: [name] MRN: [id number] DOB: [birth year]    ADMISSION DATE:  11/11/2015 CONSULTATION DATE:  11/11/15  REFERRING MD :  Dr. Roel Cluck  CHIEF COMPLAINT:  Abnormal Chest ct scan   SUBJECTIVE: Doing well this morning. She reports her dyspnea has improved. She still has cough. No chest pain.  VITAL SIGNS: Temp:  [97.7 F (36.5 C)-98.4 F (36.9 C)] 97.7 F (36.5 C) (02/16 0418) Pulse Rate:  [65-88] 82 (02/16 0418) Resp:  [18] 18 (02/16 0418) BP: (131-153)/(53-76) 131/53 mmHg (02/16 0418) SpO2:  [97 %-100 %] 98 % (02/16 0418) Weight:  [359 lb (162.841 kg)] 359 lb (162.841 kg) (02/16 0418)  PHYSICAL EXAMINATION:  Vitals:  Filed Vitals:   11/13/15 1741 11/13/15 2003 11/13/15 2032 11/14/15 0418  BP:  136/76  131/53  Pulse:  88 65 82  Temp:  98.4 F (36.9 C)  97.7 F (36.5 C)  TempSrc:  Oral  Oral  Resp:  18 18 18   Height:      Weight:    359 lb (162.841 kg)  SpO2: 100% 98% 100% 98%    Constitutional/General:  NAD HEENT: Anicteric sclerae. Normal nasal mucosa. No oral  lesions,  mouth clear,  oropharynx clear. Neck: No masses. Midline trachea. Respiratory/Chest: Clear bilaterally Cardiovascular: Regular rate and  rhythm, heart sounds normal Gastrointestinal:  Normal bowel sounds. Soft, non-tender.  Musculoskeletal:  Normal muscle tone. Extremities: Grossly normal. 2+ edema Skin: No rash/lesions Neuro: Alert, oriented to time, place, person.    Recent Labs Lab 11/11/15 1615 11/12/15 0225 11/14/15 0520  NA 136  --  137  K 4.4  --  4.3  CL 97*  --  98*  CO2 26  --  31  BUN 6  --  12  CREATININE 0.51 0.61 0.71  GLUCOSE 173*  --  240*    Recent Labs Lab 11/11/15 1615 11/12/15 0225  HGB 11.1* 10.2*  HCT 36.0 33.6*  WBC 6.9 6.5  PLT 279 264   No results found.  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  Patient has significant comorbidities which include untreated sleep apnea, diastolic heart failure. Recent respiratory infection treated with Levaquin in January. Chest CTA scan showed no filling defects, poor inspiratory effort, mediastinal and hilar lymphadenopathy, bilateral patchy groundglass like infiltrates. She had a chest CT scan in 2012 which looks similar but the lymph nodes were not as prominent.   Abnormal chest CTA: Differential includes underlying pulmonary venous congestion, diastolic heart failure, possible infection (atypical infection, pneumonia, TB, MAC), upper airway congestion/upper airway cough syndrome, aspiration pneumonitis, inflammatory. HIV ab nonreactive. BCx NGTD. Urine strep pneumo and Legionella negative. -Isolation for TB, contact precautions. -Hold off on bronch for now. Pending quantiferon and AFB smear/culture x 3. -TB quantiferon pending -Sputum culture for AFB smear and culture x 3. One pending.  -Sputum gram stain and culture pending -Budesonide neb BID and Duoneb QID. -IS, flutter valve -Consider lower extremity venous duplex  Acute on chronic diastolic heart failure: Echo 10/04/2015 with EF 60-65%. -Diuresis per primary -Check ABG after diuresis  OSA -Will need sleep study as outpatient  OHS -Will need to work on weight loss as outpatient  GERD: -PPI  Jacques Earthly, MD  Internal Medicine PGY-2  Pulmonary and [place] Pager : [phone] 11/14/2015, 9:49 AM

## 2015-11-15 LAB — BASIC METABOLIC PANEL
Anion gap: 8 (ref 5–15)
BUN: 11 mg/dL (ref 6–20)
CALCIUM: 8.7 mg/dL — AB (ref 8.9–10.3)
CHLORIDE: 99 mmol/L — AB (ref 101–111)
CO2: 29 mmol/L (ref 22–32)
CREATININE: 0.6 mg/dL (ref 0.44–1.00)
GFR calc non Af Amer: >60 mL/min (ref >60)
Glucose, Bld: 193 mg/dL — ABNORMAL HIGH (ref 65–99)
Potassium: 3.9 mmol/L (ref 3.5–5.1)
SODIUM: 136 mmol/L (ref 135–145)

## 2015-11-15 LAB — GLUCOSE, CAPILLARY
GLUCOSE-CAPILLARY: 179 mg/dL — AB (ref 65–99)
GLUCOSE-CAPILLARY: 251 mg/dL — AB (ref 65–99)
GLUCOSE-CAPILLARY: 247 mg/dL — AB (ref 65–99)
GLUCOSE-CAPILLARY: 217 mg/dL — AB (ref 65–99)

## 2015-11-15 LAB — QUANTIFERON IN TUBE
QFT TB AG MINUS NIL VALUE: 0.01 IU/mL
QUANTIFERON MITOGEN VALUE: 1.7 [IU]/mL
QUANTIFERON NIL VALUE: 0.02 [IU]/mL
QUANTIFERON TB AG VALUE: 0.03 IU/mL
QUANTIFERON TB GOLD: NEGATIVE

## 2015-11-15 LAB — GRAM STAIN

## 2015-11-15 LAB — CULTURE, GROUP A STREP (THRC)

## 2015-11-15 LAB — QUANTIFERON TB GOLD ASSAY (BLOOD)

## 2015-11-15 MED ORDER — INSULIN STARTER KIT- SYRINGES (ENGLISH)
1.0000 | Freq: Once | Status: DC
  Filled 2015-11-15: qty 1

## 2015-11-15 MED ORDER — METOLAZONE 2.5 MG PO TABS: 2.5000 mg | ORAL_TABLET | Freq: Once | ORAL | Status: DC

## 2015-11-15 MED ORDER — METOLAZONE 2.5 MG PO TABS
2.5000 mg | ORAL_TABLET | Freq: Once | ORAL | Status: AC
  Administered 2015-11-15: 2.5 mg via ORAL
  Filled 2015-11-15: qty 1

## 2015-11-15 MED ORDER — FUROSEMIDE 10 MG/ML IJ SOLN
80.0000 mg | Freq: Three times a day (TID) | INTRAMUSCULAR | Status: DC
  Administered 2015-11-15 – 2015-11-17 (×6): 80 mg via INTRAVENOUS
  Filled 2015-11-15 (×6): qty 8

## 2015-11-15 MED ORDER — INSULIN DETEMIR 100 UNIT/ML ~~LOC~~ SOLN
25.0000 [IU] | Freq: Two times a day (BID) | SUBCUTANEOUS | Status: DC
Start: 2015-11-15 — End: 2015-11-17
  Administered 2015-11-15 – 2015-11-17 (×5): 25 [IU] via SUBCUTANEOUS
  Filled 2015-11-15 (×6): qty 0.25

## 2015-11-15 MED ORDER — METOLAZONE 5 MG PO TABS: 5.0000 mg | ORAL_TABLET | Freq: Once | ORAL | Status: DC

## 2015-11-15 MED ORDER — LIVING WELL WITH DIABETES BOOK
Freq: Once | Status: AC
  Administered 2015-11-15: 20:00:00
  Filled 2015-11-15: qty 1

## 2015-11-15 NOTE — Progress Notes (Signed)
[facility] PROGRESS NOTE    Progress Note   [name] HGD:[id number] DOB: 1966/02/13 DOA: 11/11/2015 PCP: [name], MD   Brief Narrative:   [name] is an 50 y.o. female   Assessment/Plan:   Dyspnea and mediastinal adenopathy and ground glass changes: CT imaging of the chest done on 11/11/2015 showed multiple hilar adenopathy with groundglass appearance. AFP and serum Quanteferon gold is pending. . Pulmonary was consulted, her ESR is 87 HIV is negative Ace level is pending. Pulmonary recommended a bronchoscopy AFTER DIURESIS, blood cultures and urine Legionella negative.  Acute on Chronic diastolic congestive heart failure ([place]): She was recently discharged from the hospital on 10/06/2015 for acute decompensated diastolic heart failure, at that time her weight was 158.8 kg on admission she weighed 164.2 kg.UOP has decrease, will increase lasix and add low dose metolazone. Weight not 163.7 kg. Continue metoprolol and ARB.  OSA (obstructive sleep apnea)- non compliant with C-pap Noncompliant with her C-Pap, she refused it at night.    Essential HTN (hypertension): Fairly controlled, cont diuresis.  Poorly controlled Diabetes ([place]) BG still remains high,  Increase Levemir to 25 units twice a day.  Bipolar disorder 1: Resume seroque land remeron. Currently stable.  DVT Prophylaxis - Lovenox ordered.  Family Communication: none Disposition Plan: Home 3  days Code Status:     Code Status Orders        Start     Ordered   11/12/15 0215  Full code   Continuous     11/12/15 0214    Code Status History    Date Active Date Inactive Code Status Order ID Comments User Context   10/04/2015 12:27 AM 10/06/2015  5:21 PM Full Code [id number]  [name], MD ED   12/06/2013 11:52 PM 12/10/2013  2:33 PM Full Code [id number]  [name], MD ED   11/03/2013 12:19 AM 11/07/2013  7:23 PM Full Code [id number]  [name], MD Inpatient   06/14/2012  11:22 PM 06/15/2012  8:32 PM Full Code [id number]  [name], MD ED        IV Access:    Peripheral IV   Procedures and diagnostic studies:   No results found.   Medical Consultants:    None.  Anti-Infectives:   Anti-infectives    Start     Dose/Rate Route Frequency Ordered Stop   11/12/15 0800  vancomycin (VANCOCIN) 1,500 mg in sodium chloride 0.9 % 500 mL IVPB  Status:  Discontinued     1,500 mg 250 mL/hr over 120 Minutes Intravenous Every 8 hours 11/11/15 2154 11/12/15 0214   11/11/15 2200  vancomycin (VANCOCIN) 2,500 mg in sodium chloride 0.9 % 500 mL IVPB     2,500 mg 250 mL/hr over 120 Minutes Intravenous  Once 11/11/15 2147 11/12/15 0022   11/11/15 2145  ceFEPIme (MAXIPIME) 2 g in dextrose 5 % 50 mL IVPB     2 g 100 mL/hr over 30 Minutes Intravenous  Once 11/11/15 2144 11/12/15 0030      Subjective:    OVZCH Fielding she relates her SOB is improved.. Cannot tolerate TED hose.  Objective:    Filed Vitals:   11/14/15 2100 11/14/15 2322 11/15/15 0534 11/15/15 0755  BP: 132/54  150/70   Pulse: 86  64   Temp: 97.3 F (36.3 C)  97.6 F (36.4 C)   TempSrc: Oral  Oral   Resp: _0 Height:      Weight:   163.703 kg (  360 lb 14.4 oz)   SpO2: 98% 99% 98% 100%    Intake/Output Summary (Last 24 hours) at 11/15/15 0758 Last data filed at 11/15/15 0615  Gross per 24 hour  Intake    240 ml  Output   1801 ml  Net  -1561 ml   Filed Weights   11/13/15 0500 11/14/15 0418 11/15/15 0534  Weight: 163.98 kg (361 lb 8.2 oz) 162.841 kg (359 lb) 163.703 kg (360 lb 14.4 oz)    Exam: Gen:  NAD, morbidly obese Cardiovascular:  RRR. Chest and lungs:   Good air movement clear to auscultation  Abdomen:  Abdomen soft, NT/ND, + BS Extremities:  2+ lower extremity edema   Data Reviewed:    Labs: Basic Metabolic Panel:  Recent Labs Lab 11/11/15 1615 11/12/15 0225 11/14/15 0520 11/15/15 0636  NA 136  --  137 136  K 4.4  --  4.3 3.9  CL 97*  --   98* 99*  CO2 26  --  31 29  GLUCOSE 173*  --  240* 193*  BUN 6  --  12 11  CREATININE 0.51 0.61 0.71 0.60  CALCIUM 9.0  --  8.9 8.7*   GFR Estimated Creatinine Clearance: 140.5 mL/min (by C-G formula based on Cr of 0.6). Liver Function Tests: No results for input(s): AST, ALT, ALKPHOS, BILITOT, PROT, ALBUMIN in the last 168 hours. No results for input(s): LIPASE, AMYLASE in the last 168 hours. No results for input(s): AMMONIA in the last 168 hours. Coagulation profile No results for input(s): INR, PROTIME in the last 168 hours.  CBC:  Recent Labs Lab 11/11/15 1615 11/12/15 0225  WBC 6.9 6.5  HGB 11.1* 10.2*  HCT 36.0 33.6*  MCV 80.9 82.4  PLT 279 264   Cardiac Enzymes:  Recent Labs Lab 11/12/15 0225 11/12/15 0521  TROPONINI <0.03 <0.03   BNP (last 3 results) No results for input(s): PROBNP in the last 8760 hours. CBG:  Recent Labs Lab 11/14/15 1116 11/14/15 1343 11/14/15 1830 11/14/15 2106 11/15/15 0535  GLUCAP 271* 253* 330* 257* 179*   D-Dimer: No results for input(s): DDIMER in the last 72 hours. Hgb A1c: No results for input(s): HGBA1C in the last 72 hours. Lipid Profile: No results for input(s): CHOL, HDL, LDLCALC, TRIG, CHOLHDL, LDLDIRECT in the last 72 hours. Thyroid function studies: No results for input(s): TSH, T4TOTAL, T3FREE, THYROIDAB in the last 72 hours.  Invalid input(s): FREET3 Anemia work up: No results for input(s): VITAMINB12, FOLATE, FERRITIN, TIBC, IRON, RETICCTPCT in the last 72 hours. Sepsis Labs:  Recent Labs Lab 11/11/15 1615 11/12/15 0225  WBC 6.9 6.5   Microbiology Recent Results (from the past 240 hour(s))  Blood culture (routine x 2)     Status: None (Preliminary result)   Collection Time: 11/11/15  9:56 PM  Result Value Ref Range Status   Specimen Description BLOOD RIGHT HAND  Final   Special Requests BOTTLES DRAWN AEROBIC AND ANAEROBIC 5CC  Final   Culture NO GROWTH 3 DAYS  Final   Report Status PENDING   Incomplete  Blood culture (routine x 2)     Status: None (Preliminary result)   Collection Time: 11/11/15 10:07 PM  Result Value Ref Range Status   Specimen Description BLOOD LEFT HAND  Final   Special Requests BOTTLES DRAWN AEROBIC AND ANAEROBIC 5CC  Final   Culture NO GROWTH 3 DAYS  Final   Report Status PENDING  Incomplete  Rapid strep screen (not at [facility])  Status: None   Collection Time: 11/12/15  2:30 PM  Result Value Ref Range Status   Streptococcus, Group A Screen (Direct) NEGATIVE NEGATIVE Final    Comment: (NOTE) A Rapid Antigen test may result negative if the antigen level in the sample is below the detection level of this test. The FDA has not cleared this test as a stand-alone test therefore the rapid antigen negative result has reflexed to a Group A Strep culture.   MRSA PCR Screening     Status: None   Collection Time: 11/12/15  2:30 PM  Result Value Ref Range Status   MRSA by PCR NEGATIVE NEGATIVE Final    Comment:        The GeneXpert MRSA Assay (FDA approved for NASAL specimens only), is one component of a comprehensive MRSA colonization surveillance program. It is not intended to diagnose MRSA infection nor to guide or monitor treatment for MRSA infections.   Culture, group A strep     Status: None (Preliminary result)   Collection Time: 11/12/15  2:30 PM  Result Value Ref Range Status   Specimen Description THROAT  Final   Special Requests NONE Reflexed from [id number]  Final   Culture CULTURE REINCUBATED FOR BETTER GROWTH  Final   Report Status PENDING  Incomplete  Culture, expectorated sputum-assessment     Status: None   Collection Time: 11/13/15 10:39 PM  Result Value Ref Range Status   Specimen Description EXPECTORATED SPUTUM  Final   Special Requests NONE  Final   Sputum evaluation   Final    THIS SPECIMEN IS ACCEPTABLE. RESPIRATORY CULTURE REPORT TO FOLLOW.   Report Status 11/14/2015 FINAL  Final  Gram stain     Status: None   Collection Time:  11/15/15  2:55 AM  Result Value Ref Range Status   Specimen Description EXPECTORATED SPUTUM  Final   Special Requests NONE  Final   Gram Stain   Final    MODERATE WBC PRESENT,BOTH PMN AND MONONUCLEAR FEW GRAM POSITIVE COCCI IN PAIRS RARE GRAM NEGATIVE RODS RARE GRAM NEGATIVE DIPLOCOCCI    Report Status 11/15/2015 FINAL  Final     Medications:   . amLODipine  10 mg Oral Daily  . benzonatate  100 mg Oral TID  . budesonide (PULMICORT) nebulizer solution  0.5 mg Nebulization BID  . chlorpheniramine-HYDROcodone  5 mL Oral Q12H  . enoxaparin (LOVENOX) injection  0.5 mg/kg Subcutaneous Daily  . fluticasone  2 spray Each Nare Daily  . furosemide  80 mg Intravenous Q12H  . insulin aspart  0-15 Units Subcutaneous TID WC  . insulin aspart  0-5 Units Subcutaneous QHS  . insulin detemir  20 Units Subcutaneous BID  . ipratropium-albuterol  3 mL Nebulization QID  . losartan  100 mg Oral Daily  . metoprolol succinate  50 mg Oral Daily  . mirtazapine  30 mg Oral QHS  . pantoprazole  40 mg Oral Daily  . potassium chloride  20 mEq Oral BID  . risperidone  4 mg Oral Daily  . rosuvastatin  10 mg Oral q1800   Continuous Infusions:   Time spent: 25 min   LOS: 2 days   Charlynne Cousins  [facility] Pager [phone]  *Please refer to [email or web address], password [email or web address] to get updated schedule on who will round on this patient, as hospitalists switch teams weekly. If 7PM-7AM, please contact night-coverage at [email or web address], password [email or web address] for any overnight needs.  11/15/2015, 7:58 AM

## 2015-11-15 NOTE — Progress Notes (Signed)
   Name: [name] MRN: [id number] DOB: 05-12-1966    ADMISSION DATE:  11/11/2015 CONSULTATION DATE:  11/11/15  REFERRING MD :  Dr. Roel Cluck  CHIEF COMPLAINT:  Abnormal Chest ct scan   SUBJECTIVE: Improved overall. No new complaints  VITAL SIGNS: Temp:  [97.3 F (36.3 C)-97.6 F (36.4 C)] 97.6 F (36.4 C) (02/17 0534) Pulse Rate:  [64-86] 64 (02/17 0534) Resp:  [16-19] 16 (02/17 0534) BP: (130-150)/(51-70) 150/70 mmHg (02/17 0534) SpO2:  [92 %-100 %] 100 % (02/17 0755) Weight:  [360 lb 14.4 oz (163.703 kg)] 360 lb 14.4 oz (163.703 kg) (02/17 0534)  PHYSICAL EXAMINATION:  Vitals:  Filed Vitals:   11/14/15 2100 11/14/15 2322 11/15/15 0534 11/15/15 0755  BP: 132/54  150/70   Pulse: 86  64   Temp: 97.3 F (36.3 C)  97.6 F (36.4 C)   TempSrc: Oral  Oral   Resp: 19 18 16    Height:      Weight:   360 lb 14.4 oz (163.703 kg)   SpO2: 98% 99% 98% 100%    Constitutional/General:  NAD HEENT: Anicteric sclerae. Normal nasal mucosa. No oral  lesions,  mouth clear,  oropharynx clear. Neck: No masses. Midline trachea. Respiratory/Chest: Clear bilaterally Cardiovascular: Regular rate and  rhythm, heart sounds normal Gastrointestinal:  Normal bowel sounds. Soft, non-tender.  Musculoskeletal:  Normal muscle tone. Extremities: Grossly normal. 2+ edema Skin: No rash/lesions Neuro: Alert, oriented to time, place, person.    Recent Labs Lab 11/11/15 1615 11/12/15 0225 11/14/15 0520 11/15/15 0636  NA 136  --  137 136  K 4.4  --  4.3 3.9  CL 97*  --  98* 99*  CO2 26  --  31 29  BUN 6  --  12 11  CREATININE 0.51 0.61 0.71 0.60  GLUCOSE 173*  --  240* 193*    Recent Labs Lab 11/11/15 1615 11/12/15 0225  HGB 11.1* 10.2*  HCT 36.0 33.6*  WBC 6.9 6.5  PLT 279 264   No results found.  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 Patient has significant comorbidities which include untreated sleep apnea, diastolic heart failure. Recent respiratory infection treated with Levaquin in January. Chest CTA scan showed no filling defects, poor inspiratory effort, mediastinal and hilar lymphadenopathy, bilateral patchy groundglass like infiltrates. She had a chest CT scan in 2012 which looks similar but the lymph nodes were not as prominent.   Abnormal chest CTA: Differential includes underlying pulmonary venous congestion, diastolic heart failure, possible infection (atypical infection, pneumonia, TB, MAC), upper airway congestion/upper airway cough syndrome, aspiration pneumonitis, inflammatory. HIV ab nonreactive. BCx NGTD. Urine strep pneumo and Legionella negative. -Isolation for TB, contact precautions. -TB quantiferon negative -Sputum culture for AFB smear and culture x 3 pending  -Sputum gram stain and culture with few GPC in pairs and rare GNR and gram neg diplococci -Budesonide neb BID and Duoneb QID. -IS, flutter valve -Consider lower extremity venous duplex  Acute on chronic diastolic heart failure: Echo 10/04/2015 with EF 60-65%. Net negative 4.6 L since admission. -Diuresis per primary -Check ABG after diuresis  OSA -CPAP as tolerated  OHS -Will need to work on weight loss as outpatient  GERD: -PPI  Jacques Earthly, MD  Internal Medicine PGY-2  Pulmonary and [place] Pager : [phone] 11/15/2015, 9:33 AM

## 2015-11-15 NOTE — Progress Notes (Signed)
  RD consulted for nutrition education regarding diabetes.   Lab Results  Component Value Date   HGBA1C 11.7* 11/12/2015    RD familiar with patient from previous admission at which time patient and RD spoke extensively about carb modified and low sodium diet as well as tips for weight loss. Pt demonstrated very good knowledge of healthful eating and carbohydrate containing foods. At time of visit today, pt states that she has made changes. She states that she now eats either bread, pasta, or rice with one meal whereas she used to eat multiple pieces of bread and rice/pasta at each meal. She continues to eat a lot of vegetables and eats 2 pieces of fruit daily. She denies any additional nutrition education needs at this time.   At time of visit, nurse tech presented with diet gingerale and pt asked for candy, jello and other snacks. When denied, she stated she was looking forward to her mother bringing her food from home; salad, chips (which she knows she "shouldn't have", and salted cashews. RD emphasized the importance of limiting sodium and eating unsalted nuts/foods. Discouraged intake of chips for both salt intake and carbohydrate content; reviewed portion sizes.   During previous admission this RD recommended referring patient to outpatient nutrition therapy with a dietitian at [facility]. Pt states that she is very interested in outpatient nutrition therapy sessions. RD will place referral again.    Body mass index is 56.51 kg/(m^2). Pt meets criteria for Morbid Obesity based on current BMI.  Current diet order is Heart Healthy/Carb Modified, patient is consuming approximately 100% of meals at this time. Labs and medications reviewed. No further nutrition interventions warranted at this time. RD contact information provided. If additional nutrition issues arise, please re-consult RD.  Scarlette Ar RD, LDN Inpatient Clinical Dietitian Pager:  [phone] After Hours Pager: [phone]

## 2015-11-15 NOTE — Progress Notes (Signed)
Inpatient Diabetes Program Recommendations  AACE/ADA: New Consensus Statement on Inpatient Glycemic Control (2015)  Target Ranges:  Prepandial:   less than 140 mg/dL      Peak postprandial:   less than 180 mg/dL (1-2 hours)      Critically ill patients:  140 - 180 mg/dL   Review of Glycemic Control  Diabetes history: DM2 Outpatient Diabetes medications: glipizide 5 mg qd and metformin 1000 bid Current orders for Inpatient glycemic control: Levemir 20 units bid and moderate correction tidwc and HS correction scale. Noted levemir is increased to 25 units bid. She has required approximately 30 units of correction which has maintained cbg's in the 200's to 300's. Fasting glucose is improving but correction needed throughout the day is high.   Inpatient Diabetes Program Recommendations:   Patient needing high doses of levemir, presently at 11.7%  Pt has Medicaid potential, thus at this point would recommend patient use Novolin 70/30 at 40 units bid, which converts to a  total of 56 (NPH) basal and 24 units total of Regular total/day,  Will order education on how to administer insulin (if potentially being discharged on insulin).  Will order patient to watch the ed'l videos on the system network, an insulin starter kit using vial and syringe, and educational materials as the book, "Living Well with Diabetes" Will assess patient personally today to further my knowledge of what she is truly capable of self-care at home.  Also please add carbohydrate modified to Heart Healthy diet with fluid restrictions.  Thank you Rosita Kea, RN, MSN, CDE  Diabetes Inpatient Program Office: [phone] Pager: [phone] 8:00 am to 5:00 pm

## 2015-11-15 NOTE — Progress Notes (Signed)
Patient has been dumping urine hat from bathroom. She claims to have filled the hat twice and emptied it both times. This is document in the flowsheet in epic. Patient had document 1050 cc prior to 1545. Patient has been educated to call for hat to be emptied for accurate outputs. Will continue to monitor.

## 2015-11-16 LAB — CULTURE, RESPIRATORY: CULTURE: NORMAL

## 2015-11-16 LAB — CULTURE, BLOOD (ROUTINE X 2)
CULTURE: NO GROWTH
CULTURE: NO GROWTH

## 2015-11-16 LAB — BASIC METABOLIC PANEL
Anion gap: 10 (ref 5–15)
Anion gap: 11 (ref 5–15)
BUN: 14 mg/dL (ref 6–20)
BUN: 13 mg/dL (ref 6–20)
CALCIUM: 8.8 mg/dL — AB (ref 8.9–10.3)
CO2: 32 mmol/L (ref 22–32)
CO2: 34 mmol/L — AB (ref 22–32)
CREATININE: 0.8 mg/dL (ref 0.44–1.00)
Calcium: 9.1 mg/dL (ref 8.9–10.3)
Chloride: 95 mmol/L — ABNORMAL LOW (ref 101–111)
Chloride: 93 mmol/L — ABNORMAL LOW (ref 101–111)
Creatinine, Ser: 0.74 mg/dL (ref 0.44–1.00)
GFR calc Af Amer: >60 mL/min (ref >60)
GFR calc Af Amer: >60 mL/min (ref >60)
GFR calc non Af Amer: >60 mL/min (ref >60)
Glucose, Bld: 204 mg/dL — ABNORMAL HIGH (ref 65–99)
Glucose, Bld: 195 mg/dL — ABNORMAL HIGH (ref 65–99)
POTASSIUM: 4.5 mmol/L (ref 3.5–5.1)
Potassium: 4.1 mmol/L (ref 3.5–5.1)
SODIUM: 137 mmol/L (ref 135–145)
Sodium: 138 mmol/L (ref 135–145)

## 2015-11-16 LAB — GLUCOSE, CAPILLARY
GLUCOSE-CAPILLARY: 178 mg/dL — AB (ref 65–99)
GLUCOSE-CAPILLARY: 172 mg/dL — AB (ref 65–99)
GLUCOSE-CAPILLARY: 237 mg/dL — AB (ref 65–99)
Glucose-Capillary: 257 mg/dL — ABNORMAL HIGH (ref 65–99)

## 2015-11-16 LAB — CULTURE, RESPIRATORY W GRAM STAIN

## 2015-11-16 MED ORDER — METOLAZONE 2.5 MG PO TABS
2.5000 mg | ORAL_TABLET | Freq: Once | ORAL | Status: AC
  Administered 2015-11-16: 2.5 mg via ORAL
  Filled 2015-11-16: qty 1

## 2015-11-16 MED ORDER — ALPRAZOLAM 0.5 MG PO TABS
0.5000 mg | ORAL_TABLET | Freq: Once | ORAL | Status: AC
  Administered 2015-11-16: 0.5 mg via ORAL
  Filled 2015-11-16: qty 1

## 2015-11-16 NOTE — Progress Notes (Signed)
[facility] PROGRESS NOTE    Progress Note   [name] [id number] DOB: Jun 30, 1966 DOA: 11/11/2015 PCP: [name], MD   Brief Narrative:   [name] is an 50 y.o. female   Assessment/Plan:   Dyspnea and mediastinal adenopathy and ground glass changes: CT imaging of the chest done on 11/11/2015 showed multiple hilar adenopathy with groundglass appearance. AFP and serum Quanteferon gold is pending. . Pulmonary was consulted, her ESR is 87 HIV is negative Ace level is pending. Pulmonary recommended a bronchoscopy AFTER DIURESIS, blood cultures and urine Legionella negative.  Acute on Chronic diastolic congestive heart failure [facility]): Estimated dry weight  158.8 kg. HArd to evaluate fluid status. UOP has increase, cont IV lasix and low dose metolazone, weight has improved. Continue metoprolol and ARB.  OSA (obstructive sleep apnea)- non compliant with C-pap Noncompliant with her C-Pap, she refused it at night.    Essential HTN (hypertension): Fairly controlled, cont diuresis. On ARB and metoprolol.  Poorly controlled Diabetes (HCC) BG still remains high,  Cont. Levemir to 25 units twice a day.  Bipolar disorder 1: Resume seroque land remeron. Currently stable.  DVT Prophylaxis - Lovenox ordered.  Family Communication: none Disposition Plan: Home 2-3 days Code Status:     Code Status Orders        Start     Ordered   11/12/15 0215  Full code   Continuous     11/12/15 0214    Code Status History    Date Active Date Inactive Code Status Order ID Comments User Context   10/04/2015 12:27 AM 10/06/2015  5:21 PM Full Code [id number]  [name], MD ED   12/06/2013 11:52 PM 12/10/2013  2:33 PM Full Code [id number]  [name], MD ED   11/03/2013 12:19 AM 11/07/2013  7:23 PM Full Code [id number]  [name], MD Inpatient   06/14/2012 11:22 PM 06/15/2012  8:32 PM Full Code [id number]  [name], MD ED        IV Access:     Peripheral IV   Procedures and diagnostic studies:   No results found.   Medical Consultants:    None.  Anti-Infectives:   Anti-infectives    Start     Dose/Rate Route Frequency Ordered Stop   11/12/15 0800  vancomycin (VANCOCIN) 1,500 mg in sodium chloride 0.9 % 500 mL IVPB  Status:  Discontinued     1,500 mg 250 mL/hr over 120 Minutes Intravenous Every 8 hours 11/11/15 2154 11/12/15 0214   11/11/15 2200  vancomycin (VANCOCIN) 2,500 mg in sodium chloride 0.9 % 500 mL IVPB     2,500 mg 250 mL/hr over 120 Minutes Intravenous  Once 11/11/15 2147 11/12/15 0022   11/11/15 2145  ceFEPIme (MAXIPIME) 2 g in dextrose 5 % 50 mL IVPB     2 g 100 mL/hr over 30 Minutes Intravenous  Once 11/11/15 2144 11/12/15 0030      Subjective:    [name] she relates her SOB is improved.  Objective:    Filed Vitals:   11/15/15 1203 11/15/15 2053 11/15/15 2145 11/16/15 0614  BP: 150/68 132/69  138/56  Pulse: 72 65  64  Temp: 97.7 F (36.5 C) 97.6 F (36.4 C)  97.7 F (36.5 C)  TempSrc: Oral Oral  Oral  Resp: _0 Height:      Weight:    162.524 kg (358 lb 4.8 oz)  SpO2: 99% 98% 99% 100%    Intake/Output Summary (Last  24 hours) at 11/16/15 0712 Last data filed at 11/16/15 0615  Gross per 24 hour  Intake    480 ml  Output   6125 ml  Net  -5645 ml   Filed Weights   11/14/15 0418 11/15/15 0534 11/16/15 0614  Weight: 162.841 kg (359 lb) 163.703 kg (360 lb 14.4 oz) 162.524 kg (358 lb 4.8 oz)    Exam: Gen:  NAD, morbidly obese Cardiovascular:  RRR. Chest and lungs:   Good air movement clear to auscultation  Abdomen:  Abdomen soft, NT/ND, + BS Extremities:  2+ lower extremity edema   Data Reviewed:    Labs: Basic Metabolic Panel:  Recent Labs Lab 11/11/15 1615 11/12/15 0225 11/14/15 0520 11/15/15 0636  NA 136  --  137 136  K 4.4  --  4.3 3.9  CL 97*  --  98* 99*  CO2 26  --  31 29  GLUCOSE 173*  --  240* 193*  BUN 6  --  12 11  CREATININE 0.51  0.61 0.71 0.60  CALCIUM 9.0  --  8.9 8.7*   GFR Estimated Creatinine Clearance: 140 mL/min (by C-G formula based on Cr of 0.6). Liver Function Tests: No results for input(s): AST, ALT, ALKPHOS, BILITOT, PROT, ALBUMIN in the last 168 hours. No results for input(s): LIPASE, AMYLASE in the last 168 hours. No results for input(s): AMMONIA in the last 168 hours. Coagulation profile No results for input(s): INR, PROTIME in the last 168 hours.  CBC:  Recent Labs Lab 11/11/15 1615 11/12/15 0225  WBC 6.9 6.5  HGB 11.1* 10.2*  HCT 36.0 33.6*  MCV 80.9 82.4  PLT 279 264   Cardiac Enzymes:  Recent Labs Lab 11/12/15 0225 11/12/15 0521  TROPONINI <0.03 <0.03   BNP (last 3 results) No results for input(s): PROBNP in the last 8760 hours. CBG:  Recent Labs Lab 11/15/15 0535 11/15/15 1202 11/15/15 1609 11/15/15 2052 11/16/15 0613  GLUCAP 179* 251* 247* 217* 178*   D-Dimer: No results for input(s): DDIMER in the last 72 hours. Hgb A1c: No results for input(s): HGBA1C in the last 72 hours. Lipid Profile: No results for input(s): CHOL, HDL, LDLCALC, TRIG, CHOLHDL, LDLDIRECT in the last 72 hours. Thyroid function studies: No results for input(s): TSH, T4TOTAL, T3FREE, THYROIDAB in the last 72 hours.  Invalid input(s): FREET3 Anemia work up: No results for input(s): VITAMINB12, FOLATE, FERRITIN, TIBC, IRON, RETICCTPCT in the last 72 hours. Sepsis Labs:  Recent Labs Lab 11/11/15 1615 11/12/15 0225  WBC 6.9 6.5   Microbiology Recent Results (from the past 240 hour(s))  Blood culture (routine x 2)     Status: None (Preliminary result)   Collection Time: 11/11/15  9:56 PM  Result Value Ref Range Status   Specimen Description BLOOD RIGHT HAND  Final   Special Requests BOTTLES DRAWN AEROBIC AND ANAEROBIC 5CC  Final   Culture NO GROWTH 4 DAYS  Final   Report Status PENDING  Incomplete  Blood culture (routine x 2)     Status: None (Preliminary result)   Collection Time:  11/11/15 10:07 PM  Result Value Ref Range Status   Specimen Description BLOOD LEFT HAND  Final   Special Requests BOTTLES DRAWN AEROBIC AND ANAEROBIC 5CC  Final   Culture NO GROWTH 4 DAYS  Final   Report Status PENDING  Incomplete  Rapid strep screen (not at [facility])     Status: None   Collection Time: 11/12/15  2:30 PM  Result Value Ref Range Status  Streptococcus, Group A Screen (Direct) NEGATIVE NEGATIVE Final    Comment: (NOTE) A Rapid Antigen test may result negative if the antigen level in the sample is below the detection level of this test. The FDA has not cleared this test as a stand-alone test therefore the rapid antigen negative result has reflexed to a Group A Strep culture.   MRSA PCR Screening     Status: None   Collection Time: 11/12/15  2:30 PM  Result Value Ref Range Status   MRSA by PCR NEGATIVE NEGATIVE Final    Comment:        The GeneXpert MRSA Assay (FDA approved for NASAL specimens only), is one component of a comprehensive MRSA colonization surveillance program. It is not intended to diagnose MRSA infection nor to guide or monitor treatment for MRSA infections.   Culture, group A strep     Status: None   Collection Time: 11/12/15  2:30 PM  Result Value Ref Range Status   Specimen Description THROAT  Final   Special Requests NONE Reflexed from [id number]  Final   Culture NO GROUP A STREP (S.PYOGENES) ISOLATED  Final   Report Status 11/15/2015 FINAL  Final  Culture, respiratory (NON-Expectorated)     Status: None (Preliminary result)   Collection Time: 11/13/15  1:07 AM  Result Value Ref Range Status   Specimen Description EXPECTORATED SPUTUM  Final   Special Requests NONE  Final   Gram Stain   Final    MODERATE WBC PRESENT,BOTH PMN AND MONONUCLEAR MODERATE SQUAMOUS EPITHELIAL CELLS PRESENT ABUNDANT GRAM POSITIVE COCCI IN PAIRS IN CHAINS ABUNDANT GRAM POSITIVE RODS RARE GRAM NEGATIVE RODS Performed at [facility]    Culture   Final     Culture reincubated for better growth Performed at [facility]    Report Status PENDING  Incomplete  AFB culture with smear (NOT at [facility]     Status: None (Preliminary result)   Collection Time: 11/13/15 10:20 PM  Result Value Ref Range Status   Specimen Description EXPECTORATED SPUTUM  Final   Special Requests NONE  Final   Acid Fast Smear   Final    NO ACID FAST BACILLI SEEN Performed at [facility]    Culture   Final    CULTURE WILL BE EXAMINED FOR 6 WEEKS BEFORE ISSUING A FINAL REPORT Performed at [facility]    Report Status PENDING  Incomplete  Culture, expectorated sputum-assessment     Status: None   Collection Time: 11/13/15 10:39 PM  Result Value Ref Range Status   Specimen Description EXPECTORATED SPUTUM  Final   Special Requests NONE  Final   Sputum evaluation   Final    THIS SPECIMEN IS ACCEPTABLE. RESPIRATORY CULTURE REPORT TO FOLLOW.   Report Status 11/14/2015 FINAL  Final  AFB culture with smear (NOT at [facility])     Status: None (Preliminary result)   Collection Time: 11/14/15  2:19 PM  Result Value Ref Range Status   Specimen Description SPUTUM  Final   Special Requests NONE  Final   Acid Fast Smear   Final    NO ACID FAST BACILLI SEEN Performed at [facility]    Culture   Final    CULTURE WILL BE EXAMINED FOR 6 WEEKS BEFORE ISSUING A FINAL REPORT Performed at [facility]    Report Status PENDING  Incomplete  Gram stain     Status: None   Collection Time: 11/15/15  2:55 AM  Result Value  Ref Range Status   Specimen Description EXPECTORATED SPUTUM  Final   Special Requests NONE  Final   Gram Stain   Final    MODERATE WBC PRESENT,BOTH PMN AND MONONUCLEAR FEW GRAM POSITIVE COCCI IN PAIRS RARE GRAM NEGATIVE RODS RARE GRAM NEGATIVE DIPLOCOCCI    Report Status 11/15/2015 FINAL  Final     Medications:   . amLODipine  10 mg Oral Daily  . benzonatate  100 mg Oral TID  . budesonide (PULMICORT) nebulizer  solution  0.5 mg Nebulization BID  . chlorpheniramine-HYDROcodone  5 mL Oral Q12H  . enoxaparin (LOVENOX) injection  0.5 mg/kg Subcutaneous Daily  . fluticasone  2 spray Each Nare Daily  . furosemide  80 mg Intravenous 3 times per day  . insulin aspart  0-15 Units Subcutaneous TID WC  . insulin aspart  0-5 Units Subcutaneous QHS  . insulin detemir  25 Units Subcutaneous BID  . insulin starter kit- syringes  1 kit Other Once  . ipratropium-albuterol  3 mL Nebulization QID  . losartan  100 mg Oral Daily  . metoprolol succinate  50 mg Oral Daily  . mirtazapine  30 mg Oral QHS  . pantoprazole  40 mg Oral Daily  . potassium chloride  20 mEq Oral BID  . risperidone  4 mg Oral Daily  . rosuvastatin  10 mg Oral q1800   Continuous Infusions:   Time spent: 25 min   LOS: 3 days   Charlynne Cousins  [facility] Pager [phone]  *Please refer to [email or web address], password [email or web address] to get updated schedule on who will round on this patient, as hospitalists switch teams weekly. If 7PM-7AM, please contact night-coverage at [email or web address], password [email or web address] for any overnight needs.  11/16/2015, 7:12 AM

## 2015-11-16 NOTE — Progress Notes (Signed)
Pt c/o shaking chills. Temp checked- 98.4

## 2015-11-17 ENCOUNTER — Inpatient Hospital Stay (HOSPITAL_COMMUNITY): Payer: MEDICAID

## 2015-11-17 LAB — CBC
HEMATOCRIT: 33.8 % — AB (ref 36.0–46.0)
Hemoglobin: 10.1 g/dL — ABNORMAL LOW (ref 12.0–15.0)
MCH: 24.5 pg — AB (ref 26.0–34.0)
MCHC: 29.9 g/dL — ABNORMAL LOW (ref 30.0–36.0)
MCV: 82 fL (ref 78.0–100.0)
PLATELETS: 267 10*3/uL (ref 150–400)
RBC: 4.12 MIL/uL (ref 3.87–5.11)
RDW: 15.7 % — ABNORMAL HIGH (ref 11.5–15.5)
WBC: 10.2 10*3/uL (ref 4.0–10.5)

## 2015-11-17 LAB — BASIC METABOLIC PANEL
ANION GAP: 11 (ref 5–15)
BUN: 24 mg/dL — AB (ref 6–20)
CHLORIDE: 91 mmol/L — AB (ref 101–111)
CO2: 33 mmol/L — AB (ref 22–32)
CREATININE: 1.32 mg/dL — AB (ref 0.44–1.00)
Calcium: 9 mg/dL (ref 8.9–10.3)
GFR calc non Af Amer: 47 mL/min — ABNORMAL LOW (ref >60)
GFR, EST AFRICAN AMERICAN: 55 mL/min — AB (ref >60)
Glucose, Bld: 255 mg/dL — ABNORMAL HIGH (ref 65–99)
Potassium: 4.1 mmol/L (ref 3.5–5.1)
Sodium: 135 mmol/L (ref 135–145)

## 2015-11-17 LAB — GLUCOSE, CAPILLARY
GLUCOSE-CAPILLARY: 337 mg/dL — AB (ref 65–99)
GLUCOSE-CAPILLARY: 160 mg/dL — AB (ref 65–99)
Glucose-Capillary: 258 mg/dL — ABNORMAL HIGH (ref 65–99)
Glucose-Capillary: 235 mg/dL — ABNORMAL HIGH (ref 65–99)

## 2015-11-17 MED ORDER — IBUPROFEN 400 MG PO TABS
400.0000 mg | ORAL_TABLET | Freq: Once | ORAL | Status: AC
  Administered 2015-11-17: 400 mg via ORAL
  Filled 2015-11-17: qty 1

## 2015-11-17 MED ORDER — SODIUM CHLORIDE 0.9 % IV BOLUS (SEPSIS): 500.0000 mL | Freq: Once | INTRAVENOUS | Status: DC

## 2015-11-17 MED ORDER — SODIUM CHLORIDE 0.9 % IV BOLUS (SEPSIS)
500.0000 mL | Freq: Once | INTRAVENOUS | Status: AC
  Administered 2015-11-18: 500 mL via INTRAVENOUS

## 2015-11-17 MED ORDER — IPRATROPIUM-ALBUTEROL 0.5-2.5 (3) MG/3ML IN SOLN
3.0000 mL | Freq: Two times a day (BID) | RESPIRATORY_TRACT | Status: DC
  Administered 2015-11-17 – 2015-11-20 (×7): 3 mL via RESPIRATORY_TRACT
  Filled 2015-11-17 (×9): qty 3

## 2015-11-17 MED ORDER — INSULIN DETEMIR 100 UNIT/ML ~~LOC~~ SOLN
45.0000 [IU] | Freq: Two times a day (BID) | SUBCUTANEOUS | Status: DC
  Administered 2015-11-17 – 2015-11-18 (×3): 45 [IU] via SUBCUTANEOUS
  Filled 2015-11-17 (×5): qty 0.45

## 2015-11-17 MED ORDER — LEVOFLOXACIN 750 MG PO TABS: 750.0000 mg | ORAL_TABLET | Freq: Every day | ORAL | Status: DC

## 2015-11-17 MED ORDER — FUROSEMIDE 80 MG PO TABS
80.0000 mg | ORAL_TABLET | Freq: Two times a day (BID) | ORAL | Status: DC
  Administered 2015-11-17: 80 mg via ORAL
  Filled 2015-11-17: qty 1

## 2015-11-17 MED ORDER — FUROSEMIDE 80 MG PO TABS: 80.0000 mg | ORAL_TABLET | Freq: Two times a day (BID) | ORAL | Status: DC

## 2015-11-17 NOTE — Progress Notes (Signed)
Family came in claiiming  the pt  Is confused and  With innapropriate  Answer to querries vs done and charted P;aced back to be bed and rested thereafter. The family claiming pt not confused any more. Made MD aware

## 2015-11-17 NOTE — Progress Notes (Addendum)
[facility] PROGRESS NOTE    Progress Note   [name]:[id number] DOB: 1966/01/31 DOA: 11/11/2015 PCP: [name], MD   Brief Narrative:   [name] is an 50 y.o. female With PMH of DM 2 diastolic heart failure bipolar disorder and noncomplaince that come in with chronic cough now productive, denies any fever. She was recently treated with with antibiotics which she never filled. She saw her PCP who order a CT ans showed hilar adenopathy, confirm with CT chest on this admission with "ground glass appearance". PCCM consult rec bronchoscopy. She is non complaint with her meds her weight has increase significantly since discharge and has diurese well on lasix IV. Her SOB has improved.  Assessment/Plan:   Dyspnea and mediastinal adenopathy and ground glass changes: CT imaging of the chest done on 11/11/2015 showed multiple hilar adenopathy with groundglass appearance. AFP no acid fast bacilli seen and serum Quanteferon gold is negative, respiratory culture showed abundant gram-positive cocci impair in change in gram-positive rods with rare gram-negative rods. Abx for 24 hrs on 11/12/2015. Pulmonary was consulted, her ESR is 87 HIV is negative Ace level is pending. Pulmonary recommended a bronchoscopy AFTER DIURESIS, we have completed diuresis awaiting pulmonary recommendations.  Acute on Chronic diastolic congestive heart failure [facility]): Estimated dry weight  158.8 kg. he came down to 157 kg. UOP has increase will hold Lasix resume in the afternoon, her Cr. Has increase. Continue metoprolol and ARB.  OSA (obstructive sleep apnea)- non compliant with C-pap Noncompliant with her C-Pap, she refused it at night.    Essential HTN (hypertension): Well controlled on ARB Lasix and metoprolol.  Poorly controlled Diabetes (HCC) BG still remains high,  Cont. Levemir to 25 units twice a day. Plus sliding scale insulin.  Bipolar disorder 1: Resume seroque land  remeron. Currently stable.  DVT Prophylaxis - Lovenox ordered.  Family Communication: none Disposition Plan: Home 2-3 days Code Status:     Code Status Orders        Start     Ordered   11/12/15 0215  Full code   Continuous     11/12/15 0214    Code Status History    Date Active Date Inactive Code Status Order ID Comments User Context   10/04/2015 12:27 AM 10/06/2015  5:21 PM Full Code [id number]  [name], MD ED   12/06/2013 11:52 PM 12/10/2013  2:33 PM Full Code [id number]  [name], MD ED   11/03/2013 12:19 AM 11/07/2013  7:23 PM Full Code [id number]  [name], MD Inpatient   06/14/2012 11:22 PM 06/15/2012  8:32 PM Full Code [id number]  [name], MD ED        IV Access:    Peripheral IV   Procedures and diagnostic studies:   No results found.   Medical Consultants:    None.  Anti-Infectives:   Anti-infectives    Start     Dose/Rate Route Frequency Ordered Stop   11/12/15 0800  vancomycin (VANCOCIN) 1,500 mg in sodium chloride 0.9 % 500 mL IVPB  Status:  Discontinued     1,500 mg 250 mL/hr over 120 Minutes Intravenous Every 8 hours 11/11/15 2154 11/12/15 0214   11/11/15 2200  vancomycin (VANCOCIN) 2,500 mg in sodium chloride 0.9 % 500 mL IVPB     2,500 mg 250 mL/hr over 120 Minutes Intravenous  Once 11/11/15 2147 11/12/15 0022   11/11/15 2145  ceFEPIme (MAXIPIME) 2 g in dextrose 5 % 50 mL IVPB  2 g 100 mL/hr over 30 Minutes Intravenous  Once 11/11/15 2144 11/12/15 0030      Subjective:    BPPHK Weisbecker she relates her SOB is improved.  Objective:    Filed Vitals:   11/16/15 2118 11/17/15 0558 11/17/15 0812 11/17/15 0834  BP: 135/76 104/53    Pulse: 80 80    Temp: 98 F (36.7 C) 98.1 F (36.7 C)    TempSrc: Oral Oral    Resp: 18 18    Height:      Weight:   157.67 kg (347 lb 9.6 oz)   SpO2: 98% 97%  90%    Intake/Output Summary (Last 24 hours) at 11/17/15 0850 Last data filed at 11/17/15 0559  Gross per 24 hour   Intake    720 ml  Output   3925 ml  Net  -3205 ml   Filed Weights   11/15/15 0534 11/16/15 0614 11/17/15 0812  Weight: 163.703 kg (360 lb 14.4 oz) 162.524 kg (358 lb 4.8 oz) 157.67 kg (347 lb 9.6 oz)    Exam: Gen:  NAD, morbidly obese Cardiovascular:  RRR. Chest and lungs:   Good air movement clear to auscultation  Abdomen:  Abdomen soft, NT/ND, + BS Extremities:  2+ lower extremity edema   Data Reviewed:    Labs: Basic Metabolic Panel:  Recent Labs Lab 11/14/15 0520 11/15/15 0636 11/16/15 0605 11/16/15 0719 11/17/15 0401  NA 137 136 137 138 135  K 4.3 3.9 4.5 4.1 4.1  CL 98* 99* 95* 93* 91*  CO2 31 29 32 34* 33*  GLUCOSE 240* 193* 204* 195* 255*  BUN '12 11 14 13 ' 24*  CREATININE 0.71 0.60 0.80 0.74 1.32*  CALCIUM 8.9 8.7* 8.8* 9.1 9.0   GFR Estimated Creatinine Clearance: 83.2 mL/min (by C-G formula based on Cr of 1.32). Liver Function Tests: No results for input(s): AST, ALT, ALKPHOS, BILITOT, PROT, ALBUMIN in the last 168 hours. No results for input(s): LIPASE, AMYLASE in the last 168 hours. No results for input(s): AMMONIA in the last 168 hours. Coagulation profile No results for input(s): INR, PROTIME in the last 168 hours.  CBC:  Recent Labs Lab 11/11/15 1615 11/12/15 0225 11/17/15 0401  WBC 6.9 6.5 10.2  HGB 11.1* 10.2* 10.1*  HCT 36.0 33.6* 33.8*  MCV 80.9 82.4 82.0  PLT 279 264 267   Cardiac Enzymes:  Recent Labs Lab 11/12/15 0225 11/12/15 0521  TROPONINI <0.03 <0.03   BNP (last 3 results) No results for input(s): PROBNP in the last 8760 hours. CBG:  Recent Labs Lab 11/16/15 0613 11/16/15 1203 11/16/15 1712 11/16/15 2110 11/17/15 0551  GLUCAP 178* 172* 257* 237* 258*   D-Dimer: No results for input(s): DDIMER in the last 72 hours. Hgb A1c: No results for input(s): HGBA1C in the last 72 hours. Lipid Profile: No results for input(s): CHOL, HDL, LDLCALC, TRIG, CHOLHDL, LDLDIRECT in the last 72 hours. Thyroid function  studies: No results for input(s): TSH, T4TOTAL, T3FREE, THYROIDAB in the last 72 hours.  Invalid input(s): FREET3 Anemia work up: No results for input(s): VITAMINB12, FOLATE, FERRITIN, TIBC, IRON, RETICCTPCT in the last 72 hours. Sepsis Labs:  Recent Labs Lab 11/11/15 1615 11/12/15 0225 11/17/15 0401  WBC 6.9 6.5 10.2   Microbiology Recent Results (from the past 240 hour(s))  Blood culture (routine x 2)     Status: None   Collection Time: 11/11/15  9:56 PM  Result Value Ref Range Status   Specimen Description BLOOD RIGHT HAND  Final  Special Requests BOTTLES DRAWN AEROBIC AND ANAEROBIC 5CC  Final   Culture NO GROWTH 5 DAYS  Final   Report Status 11/16/2015 FINAL  Final  Blood culture (routine x 2)     Status: None   Collection Time: 11/11/15 10:07 PM  Result Value Ref Range Status   Specimen Description BLOOD LEFT HAND  Final   Special Requests BOTTLES DRAWN AEROBIC AND ANAEROBIC 5CC  Final   Culture NO GROWTH 5 DAYS  Final   Report Status 11/16/2015 FINAL  Final  Rapid strep screen (not at [facility])     Status: None   Collection Time: 11/12/15  2:30 PM  Result Value Ref Range Status   Streptococcus, Group A Screen (Direct) NEGATIVE NEGATIVE Final    Comment: (NOTE) A Rapid Antigen test may result negative if the antigen level in the sample is below the detection level of this test. The FDA has not cleared this test as a stand-alone test therefore the rapid antigen negative result has reflexed to a Group A Strep culture.   MRSA PCR Screening     Status: None   Collection Time: 11/12/15  2:30 PM  Result Value Ref Range Status   MRSA by PCR NEGATIVE NEGATIVE Final    Comment:        The GeneXpert MRSA Assay (FDA approved for NASAL specimens only), is one component of a comprehensive MRSA colonization surveillance program. It is not intended to diagnose MRSA infection nor to guide or monitor treatment for MRSA infections.   Culture, group A strep     Status: None    Collection Time: 11/12/15  2:30 PM  Result Value Ref Range Status   Specimen Description THROAT  Final   Special Requests NONE Reflexed from [id number]  Final   Culture NO GROUP A STREP (S.PYOGENES) ISOLATED  Final   Report Status 11/15/2015 FINAL  Final  Culture, respiratory (NON-Expectorated)     Status: None   Collection Time: 11/13/15  1:07 AM  Result Value Ref Range Status   Specimen Description EXPECTORATED SPUTUM  Final   Special Requests NONE  Final   Gram Stain   Final    MODERATE WBC PRESENT,BOTH PMN AND MONONUCLEAR MODERATE SQUAMOUS EPITHELIAL CELLS PRESENT ABUNDANT GRAM POSITIVE COCCI IN PAIRS IN CHAINS ABUNDANT GRAM POSITIVE RODS RARE GRAM NEGATIVE RODS Performed at [facility]    Culture   Final    NORMAL OROPHARYNGEAL FLORA Performed at [facility]    Report Status 11/16/2015 FINAL  Final  AFB culture with smear (NOT at [facility])     Status: None (Preliminary result)   Collection Time: 11/13/15 10:20 PM  Result Value Ref Range Status   Specimen Description EXPECTORATED SPUTUM  Final   Special Requests NONE  Final   Acid Fast Smear   Final    NO ACID FAST BACILLI SEEN Performed at [facility]    Culture   Final    CULTURE WILL BE EXAMINED FOR 6 WEEKS BEFORE ISSUING A FINAL REPORT Performed at [facility]    Report Status PENDING  Incomplete  Culture, expectorated sputum-assessment     Status: None   Collection Time: 11/13/15 10:39 PM  Result Value Ref Range Status   Specimen Description EXPECTORATED SPUTUM  Final   Special Requests NONE  Final   Sputum evaluation   Final    THIS SPECIMEN IS ACCEPTABLE. RESPIRATORY CULTURE REPORT TO FOLLOW.   Report Status 11/14/2015 FINAL  Final  AFB culture with  smear (NOT at [facility])     Status: None (Preliminary result)   Collection Time: 11/14/15  2:19 PM  Result Value Ref Range Status   Specimen Description SPUTUM  Final   Special Requests NONE  Final   Acid Fast Smear   Final    NO ACID  FAST BACILLI SEEN Performed at [facility]    Culture   Final    CULTURE WILL BE EXAMINED FOR 6 WEEKS BEFORE ISSUING A FINAL REPORT Performed at [facility]    Report Status PENDING  Incomplete  Gram stain     Status: None   Collection Time: 11/15/15  2:55 AM  Result Value Ref Range Status   Specimen Description EXPECTORATED SPUTUM  Final   Special Requests NONE  Final   Gram Stain   Final    MODERATE WBC PRESENT,BOTH PMN AND MONONUCLEAR FEW GRAM POSITIVE COCCI IN PAIRS RARE GRAM NEGATIVE RODS RARE GRAM NEGATIVE DIPLOCOCCI    Report Status 11/15/2015 FINAL  Final  AFB culture with smear (NOT at [facility])     Status: None (Preliminary result)   Collection Time: 11/15/15  2:55 AM  Result Value Ref Range Status   Specimen Description EXPECTORATED SPUTUM  Final   Special Requests NONE  Final   Acid Fast Smear   Final    NO ACID FAST BACILLI SEEN Performed at [facility]    Culture   Final    CULTURE WILL BE EXAMINED FOR 6 WEEKS BEFORE ISSUING A FINAL REPORT Performed at [facility]    Report Status PENDING  Incomplete     Medications:   . amLODipine  10 mg Oral Daily  . benzonatate  100 mg Oral TID  . budesonide (PULMICORT) nebulizer solution  0.5 mg Nebulization BID  . chlorpheniramine-HYDROcodone  5 mL Oral Q12H  . enoxaparin (LOVENOX) injection  0.5 mg/kg Subcutaneous Daily  . fluticasone  2 spray Each Nare Daily  . furosemide  80 mg Oral BID  . insulin aspart  0-15 Units Subcutaneous TID WC  . insulin aspart  0-5 Units Subcutaneous QHS  . insulin detemir  25 Units Subcutaneous BID  . insulin starter kit- syringes  1 kit Other Once  . ipratropium-albuterol  3 mL Nebulization QID  . losartan  100 mg Oral Daily  . metoprolol succinate  50 mg Oral Daily  . mirtazapine  30 mg Oral QHS  . pantoprazole  40 mg Oral Daily  . potassium chloride  20 mEq Oral BID  . risperidone  4 mg Oral Daily  . rosuvastatin  10 mg Oral q1800    Continuous Infusions:   Time spent: 25 min   LOS: 4 days   Charlynne Cousins  [facility] Pager [phone]  *Please refer to [email or web address], password [email or web address] to get updated schedule on who will round on this patient, as hospitalists switch teams weekly. If 7PM-7AM, please contact night-coverage at [email or web address], password [email or web address] for any overnight needs.  11/17/2015, 8:50 AM

## 2015-11-17 NOTE — Progress Notes (Signed)
1500 Report received from the RN . Pt siiting at the bedside chair .Marland Kitchen No apparent distress. Pt fully awake alert and oriented . Needs attended. Safety precaution observed

## 2015-11-18 DIAGNOSIS — R509 Fever, unspecified: Secondary | ICD-10-CM

## 2015-11-18 DIAGNOSIS — G4733 Obstructive sleep apnea (adult) (pediatric): Secondary | ICD-10-CM

## 2015-11-18 DIAGNOSIS — R938 Abnormal findings on diagnostic imaging of other specified body structures: Secondary | ICD-10-CM

## 2015-11-18 LAB — URINALYSIS, ROUTINE W REFLEX MICROSCOPIC
Glucose, UA: NEGATIVE mg/dL
Ketones, ur: NEGATIVE mg/dL
Leukocytes, UA: NEGATIVE
Nitrite: NEGATIVE
Protein, ur: NEGATIVE mg/dL
Specific Gravity, Urine: 1.016 (ref 1.005–1.030)
pH: 5 (ref 5.0–8.0)

## 2015-11-18 LAB — GLUCOSE, CAPILLARY
GLUCOSE-CAPILLARY: 203 mg/dL — AB (ref 65–99)
Glucose-Capillary: 297 mg/dL — ABNORMAL HIGH (ref 65–99)
Glucose-Capillary: 302 mg/dL — ABNORMAL HIGH (ref 65–99)
Glucose-Capillary: 303 mg/dL — ABNORMAL HIGH (ref 65–99)

## 2015-11-18 LAB — BASIC METABOLIC PANEL
ANION GAP: 14 (ref 5–15)
BUN: 42 mg/dL — AB (ref 6–20)
CALCIUM: 8.1 mg/dL — AB (ref 8.9–10.3)
CO2: 29 mmol/L (ref 22–32)
Chloride: 91 mmol/L — ABNORMAL LOW (ref 101–111)
Creatinine, Ser: 2.23 mg/dL — ABNORMAL HIGH (ref 0.44–1.00)
GFR calc Af Amer: 29 mL/min — ABNORMAL LOW (ref >60)
GFR calc non Af Amer: 25 mL/min — ABNORMAL LOW (ref >60)
GLUCOSE: 233 mg/dL — AB (ref 65–99)
Potassium: 3.6 mmol/L (ref 3.5–5.1)
Sodium: 134 mmol/L — ABNORMAL LOW (ref 135–145)

## 2015-11-18 LAB — URINE MICROSCOPIC-ADD ON: WBC, UA: NONE SEEN WBC/hpf (ref 0–5)

## 2015-11-18 MED ORDER — VANCOMYCIN HCL 10 G IV SOLR
2500.0000 mg | Freq: Once | INTRAVENOUS | Status: AC
  Administered 2015-11-18: 2500 mg via INTRAVENOUS
  Filled 2015-11-18: qty 2500

## 2015-11-18 MED ORDER — DEXTROSE 5 % IV SOLN
1.0000 g | Freq: Two times a day (BID) | INTRAVENOUS | Status: DC
  Administered 2015-11-18 – 2015-11-19 (×3): 1 g via INTRAVENOUS
  Filled 2015-11-18 (×4): qty 1

## 2015-11-18 MED ORDER — SODIUM CHLORIDE 0.9 % IV BOLUS (SEPSIS)
500.0000 mL | Freq: Once | INTRAVENOUS | Status: AC
  Administered 2015-11-18: 500 mL via INTRAVENOUS

## 2015-11-18 NOTE — Progress Notes (Signed)
Inpatient Diabetes Program Recommendations  AACE/ADA: New Consensus Statement on Inpatient Glycemic Control (2015)  Target Ranges:  Prepandial:   less than 140 mg/dL      Peak postprandial:   less than 180 mg/dL (1-2 hours)      Critically ill patients:  140 - 180 mg/dL   Review of Glycemic Control  Educational materials ordered with detail as well as RN's to teach insulin administration per vial and syringe. Noted lantus increased to 45 units bid started last HS. Will follow as needed. Please see note from 2/17 recommendation: "Pt has Medicaid potential, thus at this point would recommend patient use Novolin 70/30 at 40 units bid, which converts to a total of 56 (NPH) basal and 24 units total of Regular total/day, " However now that patient is on total of 40 units bid/ 80 units total per day, her insulin doses for discharge may need higher doses.  Glad to assist in anyway. Added carbohydrate modified to Heart Healthy diet orders which may improve glucose levels (HH diet consist of high portions of carbohydrate).  Thank you Rosita Kea, RN, MSN, CDE  Diabetes Inpatient Program Office: [phone] Pager: [phone] 8:00 am to 5:00 pm

## 2015-11-18 NOTE — Progress Notes (Signed)
[facility] PROGRESS NOTE    Progress Note   [name] NUU:[id number] DOB: April 20, 1966 DOA: 11/11/2015 PCP: [name], MD   Brief Narrative:   [name] is an 50 y.o. female With PMH of DM 2 diastolic heart failure bipolar disorder and noncomplaince that come in with chronic cough now productive, denies any fever. She was recently treated with with antibiotics which she never filled. She saw her PCP who order a CT ans showed hilar adenopathy, confirm with CT chest on this admission with "ground glass appearance". PCCM consult rec bronchoscopy. She is non complaint with her meds her weight has increase significantly since discharge and has diurese well on lasix IV. Her SOB has improved. Now with Productive cough and fever.  Assessment/Plan:   Dyspnea and mediastinal adenopathy and ground glass changes: CT imaging of the chest done on 11/11/2015 showed multiple hilar adenopathy with groundglass appearance. AFP no acid fast bacilli seen and serum Quanteferon gold is negative, respiratory culture showed abundant gram-positive cocci impair in change in gram-positive rods with rare gram-negative rods. She also was spiking fevers is having a productive cough start vancomycin and cefepime on 2.20.2017. PCCM recommended to repeat a CT scan of the chest in 3 weeks.  Acute on Chronic diastolic congestive heart failure [facility]): Estimated dry weight  158.8 kg. he came down to 157 kg. A dry weight creatinine continues to rise DC Lasix hold ARB continue metoprolol. 100 mL bolus of normal saline. Check a basic met  on the morning.  OSA (obstructive sleep apnea)- non compliant with C-pap Noncompliant with her C-Pap, she refused it at night.    Essential HTN (hypertension): Well controlled on metoprolol.  Poorly controlled Diabetes (HCC) BG still remains high,  Cont. Levemir to 25 units twice a day. Plus sliding scale insulin.  Bipolar disorder 1: Resume seroque land  remeron. Currently stable.  DVT Prophylaxis - Lovenox ordered.  Family Communication: none Disposition Plan: Home 2 days Code Status:     Code Status Orders        Start     Ordered   11/12/15 0215  Full code   Continuous     11/12/15 0214    Code Status History    Date Active Date Inactive Code Status Order ID Comments User Context   10/04/2015 12:27 AM 10/06/2015  5:21 PM Full Code [id number]  [name], MD ED   12/06/2013 11:52 PM 12/10/2013  2:33 PM Full Code [id number]  [name], MD ED   11/03/2013 12:19 AM 11/07/2013  7:23 PM Full Code [id number]  [name], MD Inpatient   06/14/2012 11:22 PM 06/15/2012  8:32 PM Full Code [id number]  [name], MD ED        IV Access:    Peripheral IV   Procedures and diagnostic studies:   Dg Chest Port 1 View  12/13/2015  CLINICAL DATA:  Fever. EXAM: PORTABLE CHEST 1 VIEW COMPARISON:  Radiographs 11/04/2015, CT 11/11/2015 FINDINGS: Cardiomegaly and hilar prominence unchanged. Increased peribronchial cuffing and perihilar opacities from prior exam, may be accentuated by technique. No confluent airspace disease. No large pleural effusion or pneumothorax. Detailed evaluation limited secondary to body habitus and portable technique. IMPRESSION: Increased peribronchial cuffing and perihilar opacities from prior exam, infectious versus pulmonary edema. Findings may be accentuated by portable technique. Cardiomegaly and hilar prominence are stable. Electronically Signed   By: Jeb Levering M.D.   On: 2015/12/13 21:40     Medical Consultants:    None.  Anti-Infectives:   Anti-infectives    Start     Dose/Rate Route Frequency Ordered Stop   11/18/15 1330  ceFEPIme (MAXIPIME) 1 g in dextrose 5 % 50 mL IVPB     1 g 100 mL/hr over 30 Minutes Intravenous Every 12 hours 11/18/15 1241     11/17/15 1715  levofloxacin (LEVAQUIN) tablet 750 mg  Status:  Discontinued     750 mg Oral Daily 11/17/15 1707 11/17/15 1707    11/12/15 0800  vancomycin (VANCOCIN) 1,500 mg in sodium chloride 0.9 % 500 mL IVPB  Status:  Discontinued     1,500 mg 250 mL/hr over 120 Minutes Intravenous Every 8 hours 11/11/15 2154 11/12/15 0214   11/11/15 2200  vancomycin (VANCOCIN) 2,500 mg in sodium chloride 0.9 % 500 mL IVPB     2,500 mg 250 mL/hr over 120 Minutes Intravenous  Once 11/11/15 2147 11/12/15 0022   11/11/15 2145  ceFEPIme (MAXIPIME) 2 g in dextrose 5 % 50 mL IVPB     2 g 100 mL/hr over 30 Minutes Intravenous  Once 11/11/15 2144 11/12/15 0030      Subjective:    RTMYT [name] she relates her SOB is improved, but she's having fever and a productive cough.  Objective:    Filed Vitals:   11/17/15 2349 11/18/15 0300 11/18/15 0628 11/18/15 1004  BP: 104/46 103/55 112/59 115/59  Pulse: 82 65 70 66  Temp: 101.5 F (38.6 C) 98.6 F (37 C) 97.9 F (36.6 C)   TempSrc: Oral Oral Oral   Resp: '16 16 18   ' Height:      Weight:   157.217 kg (346 lb 9.6 oz)   SpO2:  100% 100%     Intake/Output Summary (Last 24 hours) at 11/18/15 1246 Last data filed at 11/18/15 1000  Gross per 24 hour  Intake    960 ml  Output      2 ml  Net    958 ml   Filed Weights   11/16/15 0614 11/17/15 0812 11/18/15 0628  Weight: 162.524 kg (358 lb 4.8 oz) 157.67 kg (347 lb 9.6 oz) 157.217 kg (346 lb 9.6 oz)    Exam: Gen:  NAD, morbidly obese Cardiovascular:  RRR. Chest and lungs:   Good air movement clear to auscultation  Abdomen:  Abdomen soft, NT/ND, + BS Extremities:  2+ lower extremity edema   Data Reviewed:    Labs: Basic Metabolic Panel:  Recent Labs Lab 11/15/15 0636 11/16/15 0605 11/16/15 0719 11/17/15 0401 11/18/15 0514  NA 136 137 138 135 134*  K 3.9 4.5 4.1 4.1 3.6  CL 99* 95* 93* 91* 91*  CO2 29 32 34* 33* 29  GLUCOSE 193* 204* 195* 255* 233*  BUN '11 14 13 ' 24* 42*  CREATININE 0.60 0.80 0.74 1.32* 2.23*  CALCIUM 8.7* 8.8* 9.1 9.0 8.1*   GFR Estimated Creatinine Clearance: 49.1 mL/min (by C-G formula  based on Cr of 2.23). Liver Function Tests: No results for input(s): AST, ALT, ALKPHOS, BILITOT, PROT, ALBUMIN in the last 168 hours. No results for input(s): LIPASE, AMYLASE in the last 168 hours. No results for input(s): AMMONIA in the last 168 hours. Coagulation profile No results for input(s): INR, PROTIME in the last 168 hours.  CBC:  Recent Labs Lab 11/11/15 1615 11/12/15 0225 11/17/15 0401  WBC 6.9 6.5 10.2  HGB 11.1* 10.2* 10.1*  HCT 36.0 33.6* 33.8*  MCV 80.9 82.4 82.0  PLT 279 264 267   Cardiac Enzymes:  Recent Labs  Lab 11/12/15 0225 11/12/15 0521  TROPONINI <0.03 <0.03   BNP (last 3 results) No results for input(s): PROBNP in the last 8760 hours. CBG:  Recent Labs Lab 11/17/15 1153 11/17/15 1644 11/17/15 2112 11/18/15 0623 11/18/15 1158  GLUCAP 235* 337* 160* 203* 297*   D-Dimer: No results for input(s): DDIMER in the last 72 hours. Hgb A1c: No results for input(s): HGBA1C in the last 72 hours. Lipid Profile: No results for input(s): CHOL, HDL, LDLCALC, TRIG, CHOLHDL, LDLDIRECT in the last 72 hours. Thyroid function studies: No results for input(s): TSH, T4TOTAL, T3FREE, THYROIDAB in the last 72 hours.  Invalid input(s): FREET3 Anemia work up: No results for input(s): VITAMINB12, FOLATE, FERRITIN, TIBC, IRON, RETICCTPCT in the last 72 hours. Sepsis Labs:  Recent Labs Lab 11/11/15 1615 11/12/15 0225 11/17/15 0401  WBC 6.9 6.5 10.2   Microbiology Recent Results (from the past 240 hour(s))  Blood culture (routine x 2)     Status: None   Collection Time: 11/11/15  9:56 PM  Result Value Ref Range Status   Specimen Description BLOOD RIGHT HAND  Final   Special Requests BOTTLES DRAWN AEROBIC AND ANAEROBIC 5CC  Final   Culture NO GROWTH 5 DAYS  Final   Report Status 11/16/2015 FINAL  Final  Blood culture (routine x 2)     Status: None   Collection Time: 11/11/15 10:07 PM  Result Value Ref Range Status   Specimen Description BLOOD LEFT HAND   Final   Special Requests BOTTLES DRAWN AEROBIC AND ANAEROBIC 5CC  Final   Culture NO GROWTH 5 DAYS  Final   Report Status 11/16/2015 FINAL  Final  Rapid strep screen (not at [facility])     Status: None   Collection Time: 11/12/15  2:30 PM  Result Value Ref Range Status   Streptococcus, Group A Screen (Direct) NEGATIVE NEGATIVE Final    Comment: (NOTE) A Rapid Antigen test may result negative if the antigen level in the sample is below the detection level of this test. The FDA has not cleared this test as a stand-alone test therefore the rapid antigen negative result has reflexed to a Group A Strep culture.   MRSA PCR Screening     Status: None   Collection Time: 11/12/15  2:30 PM  Result Value Ref Range Status   MRSA by PCR NEGATIVE NEGATIVE Final    Comment:        The GeneXpert MRSA Assay (FDA approved for NASAL specimens only), is one component of a comprehensive MRSA colonization surveillance program. It is not intended to diagnose MRSA infection nor to guide or monitor treatment for MRSA infections.   Culture, group A strep     Status: None   Collection Time: 11/12/15  2:30 PM  Result Value Ref Range Status   Specimen Description THROAT  Final   Special Requests NONE Reflexed from [id number]  Final   Culture NO GROUP A STREP (S.PYOGENES) ISOLATED  Final   Report Status 11/15/2015 FINAL  Final  Culture, respiratory (NON-Expectorated)     Status: None   Collection Time: 11/13/15  1:07 AM  Result Value Ref Range Status   Specimen Description EXPECTORATED SPUTUM  Final   Special Requests NONE  Final   Gram Stain   Final    MODERATE WBC PRESENT,BOTH PMN AND MONONUCLEAR MODERATE SQUAMOUS EPITHELIAL CELLS PRESENT ABUNDANT GRAM POSITIVE COCCI IN PAIRS IN CHAINS ABUNDANT GRAM POSITIVE RODS RARE GRAM NEGATIVE RODS Performed at [employer]  Final    NORMAL OROPHARYNGEAL FLORA Performed at [facility]    Report Status 11/16/2015 FINAL  Final  AFB  culture with smear (NOT at [facility])     Status: None (Preliminary result)   Collection Time: 11/13/15 10:20 PM  Result Value Ref Range Status   Specimen Description EXPECTORATED SPUTUM  Final   Special Requests NONE  Final   Acid Fast Smear   Final    NO ACID FAST BACILLI SEEN Performed at [facility]    Culture   Final    CULTURE WILL BE EXAMINED FOR 6 WEEKS BEFORE ISSUING A FINAL REPORT Performed at [facility]    Report Status PENDING  Incomplete  Culture, expectorated sputum-assessment     Status: None   Collection Time: 11/13/15 10:39 PM  Result Value Ref Range Status   Specimen Description EXPECTORATED SPUTUM  Final   Special Requests NONE  Final   Sputum evaluation   Final    THIS SPECIMEN IS ACCEPTABLE. RESPIRATORY CULTURE REPORT TO FOLLOW.   Report Status 11/14/2015 FINAL  Final  AFB culture with smear (NOT at [facility])     Status: None (Preliminary result)   Collection Time: 11/14/15  2:19 PM  Result Value Ref Range Status   Specimen Description SPUTUM  Final   Special Requests NONE  Final   Acid Fast Smear   Final    NO ACID FAST BACILLI SEEN Performed at [facility]    Culture   Final    CULTURE WILL BE EXAMINED FOR 6 WEEKS BEFORE ISSUING A FINAL REPORT Performed at [facility]    Report Status PENDING  Incomplete  Gram stain     Status: None   Collection Time: 11/15/15  2:55 AM  Result Value Ref Range Status   Specimen Description EXPECTORATED SPUTUM  Final   Special Requests NONE  Final   Gram Stain   Final    MODERATE WBC PRESENT,BOTH PMN AND MONONUCLEAR FEW GRAM POSITIVE COCCI IN PAIRS RARE GRAM NEGATIVE RODS RARE GRAM NEGATIVE DIPLOCOCCI    Report Status 11/15/2015 FINAL  Final  AFB culture with smear (NOT at [facility])     Status: None (Preliminary result)   Collection Time: 11/15/15  2:55 AM  Result Value Ref Range Status   Specimen Description EXPECTORATED SPUTUM  Final   Special Requests NONE  Final   Acid Fast Smear    Final    NO ACID FAST BACILLI SEEN Performed at [facility]    Culture   Final    CULTURE WILL BE EXAMINED FOR 6 WEEKS BEFORE ISSUING A FINAL REPORT Performed at [facility]    Report Status PENDING  Incomplete     Medications:   . amLODipine  10 mg Oral Daily  . benzonatate  100 mg Oral TID  . budesonide (PULMICORT) nebulizer solution  0.5 mg Nebulization BID  . ceFEPime (MAXIPIME) IV  1 g Intravenous Q12H  . chlorpheniramine-HYDROcodone  5 mL Oral Q12H  . enoxaparin (LOVENOX) injection  0.5 mg/kg Subcutaneous Daily  . fluticasone  2 spray Each Nare Daily  . insulin aspart  0-15 Units Subcutaneous TID WC  . insulin aspart  0-5 Units Subcutaneous QHS  . insulin detemir  45 Units Subcutaneous BID  . insulin starter kit- syringes  1 kit Other Once  . ipratropium-albuterol  3 mL Nebulization BID  . metoprolol succinate  50 mg Oral Daily  . mirtazapine  30 mg Oral QHS  .  pantoprazole  40 mg Oral Daily  . potassium chloride  20 mEq Oral BID  . risperidone  4 mg Oral Daily  . rosuvastatin  10 mg Oral q1800   Continuous Infusions:   Time spent: 25 min   LOS: 5 days   Charlynne Cousins  [facility] Pager [phone]  *Please refer to [email or web address], password [email or web address] to get updated schedule on who will round on this patient, as hospitalists switch teams weekly. If 7PM-7AM, please contact night-coverage at [email or web address], password [email or web address] for any overnight needs.  11/18/2015, 12:46 PM

## 2015-11-18 NOTE — Progress Notes (Signed)
Pharmacy Antibiotic Note  [name] is a 50 y.o. female admitted on 11/11/2015 with pneumonia.  Pharmacy has been consulted for vancomycin dosing.  Plan: 1. Vancomycin 2500 mg x 1 now.  With acutely worsening renal function, will give 1 dose and follow-up tomorrow's Scr to determine timing of subsequent doses. 2. Watch Scr, cultures, and clinical course.  Height: 5\' 7"  (170.2 cm) Weight: (!) 346 lb 9.6 oz (157.217 kg) (scale c) IBW/kg (Calculated) : 61.6  Temp (24hrs), Avg:100.2 F (37.9 C), Min:97.9 F (36.6 C), Max:102.9 F (39.4 C)   Recent Labs Lab 11/11/15 1615 11/12/15 0225  11/15/15 0636 11/16/15 0605 11/16/15 0719 11/17/15 0401 11/18/15 0514  WBC 6.9 6.5  --   --   --   --  10.2  --   CREATININE 0.51 0.61  < > 0.60 0.80 0.74 1.32* 2.23*  < > = values in this interval not displayed.  Estimated Creatinine Clearance: 49.1 mL/min (by C-G formula based on Cr of 2.23).    Allergies  Allergen Reactions  . Acetaminophen     Fits as a child "seizures-like"  . Caffeine     Tense, anxiety, increased urination  . Lisinopril Rash    Rash with lisinopril; but fosinopril is ok per patient    Antimicrobials this admission: Vanc 2/13>>2/14; Resumed 2/20 > Cefepime x 1 2/13; Resumed 2/20 >  Dose adjustments this admission: n/a  Microbiology results: 2/13 BCx x 2 > neg 2/15 Sputum Cx > normal flora 2/17 AFB > no AFB seen (pending x 6 wks) 2/19 BCx pending   Thank you for allowing pharmacy to be a part of this patient's care.  Uvaldo Rising, BCPS  Clinical Pharmacist Pager [phone]  11/18/2015 1:16 PM

## 2015-11-18 NOTE — Progress Notes (Signed)
Name: [name] MRN: [id number] DOB: 26-Apr-1966    ADMISSION DATE:  11/11/2015 CONSULTATION DATE:  11/11/15  REFERRING MD :  Dr. Roel Cluck  CHIEF COMPLAINT:  Abnormal Chest ct scan   SUBJECTIVE: Improved overall. No new complaints  VITAL SIGNS: Temp:  [97.9 F (36.6 C)-102.9 F (39.4 C)] 97.9 F (36.6 C) (02/20 0628) Pulse Rate:  [65-97] 66 (02/20 1004) Resp:  [16-24] 18 (02/20 0628) BP: (103-136)/(46-69) 115/59 mmHg (02/20 1004) SpO2:  [90 %-100 %] 100 % (02/20 0628) Weight:  [157.217 kg (346 lb 9.6 oz)] 157.217 kg (346 lb 9.6 oz) (02/20 0628)  PHYSICAL EXAMINATION:  Vitals:  Filed Vitals:   11/17/15 2349 11/18/15 0300 11/18/15 0628 11/18/15 1004  BP: 104/46 103/55 112/59 115/59  Pulse: 82 65 70 66  Temp: 101.5 F (38.6 C) 98.6 F (37 C) 97.9 F (36.6 C)   TempSrc: Oral Oral Oral   Resp: 16 16 18    Height:      Weight:   157.217 kg (346 lb 9.6 oz)   SpO2:  100% 100%     Constitutional/General:  NAD HEENT: Anicteric sclerae. Normal nasal mucosa. No oral  lesions,  mouth clear,  oropharynx clear. Neck: No masses. Midline trachea. Respiratory/Chest: Clear bilaterally Cardiovascular: Regular rate and  rhythm, heart sounds normal Gastrointestinal:  Normal bowel sounds. Soft, non-tender.  Musculoskeletal:  Normal muscle tone. Extremities: Grossly normal. 2+ edema Skin: No rash/lesions Neuro: Alert, oriented to time, place, person.    Recent Labs Lab 11/16/15 0719 11/17/15 0401 11/18/15 0514  NA 138 135 134*  K 4.1 4.1 3.6  CL 93* 91* 91*  CO2 34* 33* 29  BUN 13 24* 42*  CREATININE 0.74 1.32* 2.23*  GLUCOSE 195* 255* 233*    Recent Labs Lab 11/11/15 1615 11/12/15 0225 11/17/15 0401  HGB 11.1* 10.2* 10.1*  HCT 36.0 33.6* 33.8*  WBC 6.9 6.5 10.2  PLT 279 264 267   Dg Chest Port 1 View  11/17/2015  CLINICAL DATA:  Fever. EXAM: PORTABLE CHEST 1 VIEW COMPARISON:  Radiographs 11/04/2015, CT 11/11/2015 FINDINGS: Cardiomegaly and hilar prominence  unchanged. Increased peribronchial cuffing and perihilar opacities from prior exam, may be accentuated by technique. No confluent airspace disease. No large pleural effusion or pneumothorax. Detailed evaluation limited secondary to body habitus and portable technique. IMPRESSION: Increased peribronchial cuffing and perihilar opacities from prior exam, infectious versus pulmonary edema. Findings may be accentuated by portable technique. Cardiomegaly and hilar prominence are stable. Electronically Signed   By: Jeb Levering M.D.   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 Patient has significant comorbidities which include untreated sleep apnea, diastolic heart failure. Recent respiratory infection treated with Levaquin in January. Chest CTA scan showed no filling defects, poor inspiratory effort, enlargement of 2R and 7 mediastinal nodes, very subtle base predominant bilateral patchy groundglass like infiltrates. She had a chest CT scan in 2012 which looks similar but the lymph nodes were not as prominent.   Abnormal chest CTA: Differential includes underlying pulmonary venous congestion, diastolic heart failure, possible infection (atypical infection, pneumonia, TB, MAC), upper airway congestion/upper airway cough syndrome, aspiration pneumonitis, inflammatory. Consider also a component of airtrapping / obstruction. HIV ab nonreactive. BCx NGTD. Urine strep pneumo and Legionella negative. She has improved clinically with diuretics. I suspect that this was mild pulmonary edema +  atx.  - could d/c isolation with 2 negative AFB sputa  - would probably benefit from PFT at some point to r/o obstructive lung dz; consider d/c scheduled BD's for now - IS, flutter valve - would repeat CT chest in 3 months to look for interval change.  - ACE  level - follow in outpt pulm clinic to plan and review CT scan chest   Acute on chronic diastolic heart failure: Echo 10/04/2015 with EF 60-65%. Net negative 4.6 L since admission. - agree with diuresis  OSA -CPAP as tolerated  OHS -Will need to work on weight loss as outpatient  GERD: -PPI  I discussed case with Dr Eliezer Mccoy, MD, PhD 11/18/2015, 11:49 AM [facility] [phone] or if no answer [phone]

## 2015-11-19 DIAGNOSIS — Z794 Long term (current) use of insulin: Secondary | ICD-10-CM

## 2015-11-19 DIAGNOSIS — E0810 Diabetes mellitus due to underlying condition with ketoacidosis without coma: Secondary | ICD-10-CM

## 2015-11-19 LAB — BASIC METABOLIC PANEL
Anion gap: 13 (ref 5–15)
BUN: 50 mg/dL — AB (ref 6–20)
CALCIUM: 7.8 mg/dL — AB (ref 8.9–10.3)
CHLORIDE: 90 mmol/L — AB (ref 101–111)
CO2: 29 mmol/L (ref 22–32)
CREATININE: 1.82 mg/dL — AB (ref 0.44–1.00)
GFR calc non Af Amer: 32 mL/min — ABNORMAL LOW (ref >60)
GFR, EST AFRICAN AMERICAN: 37 mL/min — AB (ref >60)
Glucose, Bld: 182 mg/dL — ABNORMAL HIGH (ref 65–99)
Potassium: 4 mmol/L (ref 3.5–5.1)
SODIUM: 132 mmol/L — AB (ref 135–145)

## 2015-11-19 LAB — GLUCOSE, CAPILLARY
GLUCOSE-CAPILLARY: 165 mg/dL — AB (ref 65–99)
Glucose-Capillary: 195 mg/dL — ABNORMAL HIGH (ref 65–99)
Glucose-Capillary: 190 mg/dL — ABNORMAL HIGH (ref 65–99)
Glucose-Capillary: 250 mg/dL — ABNORMAL HIGH (ref 65–99)

## 2015-11-19 MED ORDER — SODIUM CHLORIDE 0.9 % IV SOLN
1250.0000 mg | Freq: Two times a day (BID) | INTRAVENOUS | Status: DC
  Administered 2015-11-19 (×2): 1250 mg via INTRAVENOUS
  Filled 2015-11-19 (×3): qty 1250

## 2015-11-19 MED ORDER — DEXTROSE 5 % IV SOLN
1.0000 g | Freq: Three times a day (TID) | INTRAVENOUS | Status: DC
  Administered 2015-11-19 – 2015-11-20 (×2): 1 g via INTRAVENOUS
  Filled 2015-11-19 (×4): qty 1

## 2015-11-19 MED ORDER — INSULIN DETEMIR 100 UNIT/ML ~~LOC~~ SOLN
55.0000 [IU] | Freq: Two times a day (BID) | SUBCUTANEOUS | Status: DC
  Administered 2015-11-19 – 2015-11-20 (×3): 55 [IU] via SUBCUTANEOUS
  Filled 2015-11-19 (×4): qty 0.55

## 2015-11-19 NOTE — Progress Notes (Signed)
[facility] PROGRESS NOTE    Progress Note   Jocabed Cheese LFY:[id number] DOB: 1966-04-08 DOA: 11/11/2015 PCP: [name], MD   Brief Narrative:   [name] is an 50 y.o. female With PMH of DM 2 diastolic heart failure bipolar disorder and noncomplaince that come in with chronic cough now productive, denies any fever. She was recently treated with with antibiotics which she never filled. She saw her PCP who order a CT ans showed hilar adenopathy, confirm with CT chest on this admission with "ground glass appearance". She was treated with abx for 24hrs on admission which were then d/c. PCCM consult rec CT chest in 3-4 weeks as an outpatient once this episode has resolved. She is non complaint with her meds her weight has increase significantly since discharge and has diurese well on lasix IV. Her SOB has improved. Now with Productive cough and fever. She develop fever on 2.20.2017 and was started on empiric abx U/a and CXR unchanged, was started on Vanc and cefepime has now remain afebrile. Will have to treat for possible HCAP. Assessment/Plan:   Dyspnea and mediastinal adenopathy and ground glass changes: CT imaging of the chest done on 11/11/2015 showed multiple hilar adenopathy with groundglass appearance. AFP no acid fast bacilli seen and serum Quanteferon gold is negative, respiratory culture showed normal flora. She also was spiking fevers is having a productive cough on start vancomycin and cefepime on 2.20.2017. Has now defervesce.  Fever/?HCAP: With Ongoing productive cough Started empirically on Vanc and cefepime 2.20.2017  Acute on Chronic diastolic congestive heart failure [facility]): Estimated dry weight  158.8 kg. She came down to 157 kg. At dry weight, creatinine continues to rise DC Lasix hold ARB continue metoprolol. Cr now improving. Resume ACE inhibitor and Lasix on 11/20/2015. Recheck a basic metabolic panel in the morning.  OSA (obstructive sleep  apnea)- non compliant with C-pap Noncompliant with her C-Pap, she refused it at night.    Essential HTN (hypertension): Well controlled on metoprolol.  Poorly controlled Diabetes (HCC) BG still remains high,  Cont. increase Levemir to 55 units twice a day plus sliding scale insulin. She'll need to go home on insulin. Metformin and glipizide unlikely to control her blood glucose.  Bipolar disorder 1: Resume seroque land remeron. Currently stable.  DVT Prophylaxis - Lovenox ordered.  Family Communication: none Disposition Plan: Home 2 days Code Status:     Code Status Orders        Start     Ordered   11/12/15 0215  Full code   Continuous     11/12/15 0214    Code Status History    Date Active Date Inactive Code Status Order ID Comments User Context   10/04/2015 12:27 AM 10/06/2015  5:21 PM Full Code [id number]  [name], MD ED   12/06/2013 11:52 PM 12/10/2013  2:33 PM Full Code [id number]  [name], MD ED   11/03/2013 12:19 AM 11/07/2013  7:23 PM Full Code [id number]  [name], MD Inpatient   06/14/2012 11:22 PM 06/15/2012  8:32 PM Full Code [id number]  [name], MD ED        IV Access:    Peripheral IV   Procedures and diagnostic studies:   Dg Chest Port 1 View  11-21-2015  CLINICAL DATA:  Fever. EXAM: PORTABLE CHEST 1 VIEW COMPARISON:  Radiographs 11/04/2015, CT 11/11/2015 FINDINGS: Cardiomegaly and hilar prominence unchanged. Increased peribronchial cuffing and perihilar opacities from prior exam, may be accentuated by technique. No confluent  airspace disease. No large pleural effusion or pneumothorax. Detailed evaluation limited secondary to body habitus and portable technique. IMPRESSION: Increased peribronchial cuffing and perihilar opacities from prior exam, infectious versus pulmonary edema. Findings may be accentuated by portable technique. Cardiomegaly and hilar prominence are stable. Electronically Signed   By: Jeb Levering M.D.   On:  11/17/2015 21:40     Medical Consultants:    None.  Anti-Infectives:   Anti-infectives    Start     Dose/Rate Route Frequency Ordered Stop   11/18/15 1330  ceFEPIme (MAXIPIME) 1 g in dextrose 5 % 50 mL IVPB     1 g 100 mL/hr over 30 Minutes Intravenous Every 12 hours 11/18/15 1241     11/18/15 1315  vancomycin (VANCOCIN) 2,500 mg in sodium chloride 0.9 % 500 mL IVPB     2,500 mg 250 mL/hr over 120 Minutes Intravenous  Once 11/18/15 1304 11/18/15 1546   11/17/15 1715  levofloxacin (LEVAQUIN) tablet 750 mg  Status:  Discontinued     750 mg Oral Daily 11/17/15 1707 11/17/15 1707   11/12/15 0800  vancomycin (VANCOCIN) 1,500 mg in sodium chloride 0.9 % 500 mL IVPB  Status:  Discontinued     1,500 mg 250 mL/hr over 120 Minutes Intravenous Every 8 hours 11/11/15 2154 11/12/15 0214   11/11/15 2200  vancomycin (VANCOCIN) 2,500 mg in sodium chloride 0.9 % 500 mL IVPB     2,500 mg 250 mL/hr over 120 Minutes Intravenous  Once 11/11/15 2147 11/12/15 0022   11/11/15 2145  ceFEPIme (MAXIPIME) 2 g in dextrose 5 % 50 mL IVPB     2 g 100 mL/hr over 30 Minutes Intravenous  Once 11/11/15 2144 11/12/15 0030      Subjective:    IWLNL L[place] she relates her SOB is improved, fever has resolved continues to have a productive cough.  Objective:    Filed Vitals:   11/18/15 1004 11/18/15 1943 11/18/15 2040 11/19/15 0602  BP: 115/59 120/64  107/49  Pulse: 66 97  69  Temp:  98.1 F (36.7 C)  98.1 F (36.7 C)  TempSrc:  Oral  Oral  Resp:  18  18  Height:      Weight:    160.029 kg (352 lb 12.8 oz)  SpO2:  96% 100% 93%    Intake/Output Summary (Last 24 hours) at 11/19/15 0815 Last data filed at 11/19/15 0600  Gross per 24 hour  Intake    480 ml  Output   1000 ml  Net   -520 ml   Filed Weights   11/17/15 0812 11/18/15 0628 11/19/15 0602  Weight: 157.67 kg (347 lb 9.6 oz) 157.217 kg (346 lb 9.6 oz) 160.029 kg (352 lb 12.8 oz)    Exam: Gen:  NAD, morbidly obese Cardiovascular:   RRR. Chest and lungs:   Good air movement clear to auscultation  Abdomen:  Abdomen soft, NT/ND, + BS Extremities:  1+ lower extremity edema   Data Reviewed:    Labs: Basic Metabolic Panel:  Recent Labs Lab 11/16/15 0605 11/16/15 0719 11/17/15 0401 11/18/15 0514 11/19/15 0415  NA 137 138 135 134* 132*  K 4.5 4.1 4.1 3.6 4.0  CL 95* 93* 91* 91* 90*  CO2 32 34* 33* 29 29  GLUCOSE 204* 195* 255* 233* 182*  BUN 14 13 24* 42* 50*  CREATININE 0.80 0.74 1.32* 2.23* 1.82*  CALCIUM 8.8* 9.1 9.0 8.1* 7.8*   GFR Estimated Creatinine Clearance: 60.9 mL/min (by C-G formula based on  Cr of 1.82). Liver Function Tests: No results for input(s): AST, ALT, ALKPHOS, BILITOT, PROT, ALBUMIN in the last 168 hours. No results for input(s): LIPASE, AMYLASE in the last 168 hours. No results for input(s): AMMONIA in the last 168 hours. Coagulation profile No results for input(s): INR, PROTIME in the last 168 hours.  CBC:  Recent Labs Lab 11/17/15 0401  WBC 10.2  HGB 10.1*  HCT 33.8*  MCV 82.0  PLT 267   Cardiac Enzymes: No results for input(s): CKTOTAL, CKMB, CKMBINDEX, TROPONINI in the last 168 hours. BNP (last 3 results) No results for input(s): PROBNP in the last 8760 hours. CBG:  Recent Labs Lab 11/18/15 0623 11/18/15 1158 11/18/15 1640 11/18/15 2121 11/19/15 0559  GLUCAP 203* 297* 302* 303* 195*   D-Dimer: No results for input(s): DDIMER in the last 72 hours. Hgb A1c: No results for input(s): HGBA1C in the last 72 hours. Lipid Profile: No results for input(s): CHOL, HDL, LDLCALC, TRIG, CHOLHDL, LDLDIRECT in the last 72 hours. Thyroid function studies: No results for input(s): TSH, T4TOTAL, T3FREE, THYROIDAB in the last 72 hours.  Invalid input(s): FREET3 Anemia work up: No results for input(s): VITAMINB12, FOLATE, FERRITIN, TIBC, IRON, RETICCTPCT in the last 72 hours. Sepsis Labs:  Recent Labs Lab 11/17/15 0401  WBC 10.2   Microbiology Recent Results (from  the past 240 hour(s))  Blood culture (routine x 2)     Status: None   Collection Time: 11/11/15  9:56 PM  Result Value Ref Range Status   Specimen Description BLOOD RIGHT HAND  Final   Special Requests BOTTLES DRAWN AEROBIC AND ANAEROBIC 5CC  Final   Culture NO GROWTH 5 DAYS  Final   Report Status 11/16/2015 FINAL  Final  Blood culture (routine x 2)     Status: None   Collection Time: 11/11/15 10:07 PM  Result Value Ref Range Status   Specimen Description BLOOD LEFT HAND  Final   Special Requests BOTTLES DRAWN AEROBIC AND ANAEROBIC 5CC  Final   Culture NO GROWTH 5 DAYS  Final   Report Status 11/16/2015 FINAL  Final  Rapid strep screen (not at [facility])     Status: None   Collection Time: 11/12/15  2:30 PM  Result Value Ref Range Status   Streptococcus, Group A Screen (Direct) NEGATIVE NEGATIVE Final    Comment: (NOTE) A Rapid Antigen test may result negative if the antigen level in the sample is below the detection level of this test. The FDA has not cleared this test as a stand-alone test therefore the rapid antigen negative result has reflexed to a Group A Strep culture.   MRSA PCR Screening     Status: None   Collection Time: 11/12/15  2:30 PM  Result Value Ref Range Status   MRSA by PCR NEGATIVE NEGATIVE Final    Comment:        The GeneXpert MRSA Assay (FDA approved for NASAL specimens only), is one component of a comprehensive MRSA colonization surveillance program. It is not intended to diagnose MRSA infection nor to guide or monitor treatment for MRSA infections.   Culture, group A strep     Status: None   Collection Time: 11/12/15  2:30 PM  Result Value Ref Range Status   Specimen Description THROAT  Final   Special Requests NONE Reflexed from [id number]  Final   Culture NO GROUP A STREP (S.PYOGENES) ISOLATED  Final   Report Status 11/15/2015 FINAL  Final  Culture, respiratory (NON-Expectorated)  Status: None   Collection Time: 11/13/15  1:07 AM  Result Value Ref  Range Status   Specimen Description EXPECTORATED SPUTUM  Final   Special Requests NONE  Final   Gram Stain   Final    MODERATE WBC PRESENT,BOTH PMN AND MONONUCLEAR MODERATE SQUAMOUS EPITHELIAL CELLS PRESENT ABUNDANT GRAM POSITIVE COCCI IN PAIRS IN CHAINS ABUNDANT GRAM POSITIVE RODS RARE GRAM NEGATIVE RODS Performed at [facility]    Culture   Final    NORMAL OROPHARYNGEAL FLORA Performed at [facility]    Report Status 11/16/2015 FINAL  Final  AFB culture with smear (NOT at [facility])     Status: None (Preliminary result)   Collection Time: 11/13/15 10:20 PM  Result Value Ref Range Status   Specimen Description EXPECTORATED SPUTUM  Final   Special Requests NONE  Final   Acid Fast Smear   Final    NO ACID FAST BACILLI SEEN Performed at [facility]    Culture   Final    CULTURE WILL BE EXAMINED FOR 6 WEEKS BEFORE ISSUING A FINAL REPORT Performed at [facility]    Report Status PENDING  Incomplete  Culture, expectorated sputum-assessment     Status: None   Collection Time: 11/13/15 10:39 PM  Result Value Ref Range Status   Specimen Description EXPECTORATED SPUTUM  Final   Special Requests NONE  Final   Sputum evaluation   Final    THIS SPECIMEN IS ACCEPTABLE. RESPIRATORY CULTURE REPORT TO FOLLOW.   Report Status 11/14/2015 FINAL  Final  AFB culture with smear (NOT at [facility])     Status: None (Preliminary result)   Collection Time: 11/14/15  2:19 PM  Result Value Ref Range Status   Specimen Description SPUTUM  Final   Special Requests NONE  Final   Acid Fast Smear   Final    NO ACID FAST BACILLI SEEN Performed at [facility]    Culture   Final    CULTURE WILL BE EXAMINED FOR 6 WEEKS BEFORE ISSUING A FINAL REPORT Performed at [facility]    Report Status PENDING  Incomplete  Gram stain     Status: None   Collection Time: 11/15/15  2:55 AM  Result Value Ref Range Status   Specimen Description EXPECTORATED SPUTUM  Final    Special Requests NONE  Final   Gram Stain   Final    MODERATE WBC PRESENT,BOTH PMN AND MONONUCLEAR FEW GRAM POSITIVE COCCI IN PAIRS RARE GRAM NEGATIVE RODS RARE GRAM NEGATIVE DIPLOCOCCI    Report Status 11/15/2015 FINAL  Final  AFB culture with smear (NOT at [facility])     Status: None (Preliminary result)   Collection Time: 11/15/15  2:55 AM  Result Value Ref Range Status   Specimen Description EXPECTORATED SPUTUM  Final   Special Requests NONE  Final   Acid Fast Smear   Final    NO ACID FAST BACILLI SEEN Performed at [facility]    Culture   Final    CULTURE WILL BE EXAMINED FOR 6 WEEKS BEFORE ISSUING A FINAL REPORT Performed at [facility]    Report Status PENDING  Incomplete  Culture, blood (routine x 2)     Status: None (Preliminary result)   Collection Time: 11/17/15  9:23 PM  Result Value Ref Range Status   Specimen Description BLOOD LEFT ANTECUBITAL  Final   Special Requests IN PEDIATRIC BOTTLE 2.5CC  Final   Culture NO GROWTH < 24 HOURS  Final   Report Status PENDING  Incomplete  Culture, blood (routine x 2)     Status: None (Preliminary result)   Collection Time: 11/17/15  9:26 PM  Result Value Ref Range Status   Specimen Description BLOOD LEFT ANTECUBITAL  Final   Special Requests IN PEDIATRIC BOTTLE 3CC  Final   Culture NO GROWTH < 24 HOURS  Final   Report Status PENDING  Incomplete     Medications:   . amLODipine  10 mg Oral Daily  . benzonatate  100 mg Oral TID  . budesonide (PULMICORT) nebulizer solution  0.5 mg Nebulization BID  . ceFEPime (MAXIPIME) IV  1 g Intravenous Q12H  . chlorpheniramine-HYDROcodone  5 mL Oral Q12H  . enoxaparin (LOVENOX) injection  0.5 mg/kg Subcutaneous Daily  . fluticasone  2 spray Each Nare Daily  . insulin aspart  0-15 Units Subcutaneous TID WC  . insulin aspart  0-5 Units Subcutaneous QHS  . insulin detemir  45 Units Subcutaneous BID  . insulin starter kit- syringes  1 kit Other Once  .  ipratropium-albuterol  3 mL Nebulization BID  . metoprolol succinate  50 mg Oral Daily  . mirtazapine  30 mg Oral QHS  . pantoprazole  40 mg Oral Daily  . potassium chloride  20 mEq Oral BID  . risperidone  4 mg Oral Daily  . rosuvastatin  10 mg Oral q1800   Continuous Infusions:   Time spent: 25 min   LOS: 6 days   Charlynne Cousins  [facility] Pager [phone]  *Please refer to [email or web address], password [email or web address] to get updated schedule on who will round on this patient, as hospitalists switch teams weekly. If 7PM-7AM, please contact night-coverage at [email or web address], password [email or web address] for any overnight needs.  11/19/2015, 8:15 AM

## 2015-11-19 NOTE — Progress Notes (Signed)
Per Joycelyn Schmid, Infection Control, ok to discontinue Airborne precautions, as test results continue to be negative.

## 2015-11-19 NOTE — Progress Notes (Signed)
Pharmacy Antibiotic Note  [name] is a 50 y.o. female admitted on 11/11/2015 with pneumonia.  Pharmacy has been consulted for vancomycin dosing.  Tm 98, WBC wnl at 10 - was treated for PNA a few weeks ago but did not fill meds at D/C plan to treat like HCAP.  Cr improved 1.3>2.2>1.8 CrCl about 60   Plan: 1. Vancomycin 2500 mg x 1 2/20 then start 1250mg  q12h today - monitor renal function for need for adjustments 2. Change cefepime 1gm q8h for improved renal fx  Height: 5\' 7"  (170.2 cm) Weight: (!) 352 lb 12.8 oz (160.029 kg) (scale c) IBW/kg (Calculated) : 61.6  Temp (24hrs), Avg:98.1 F (36.7 C), Min:98.1 F (36.7 C), Max:98.1 F (36.7 C)   Recent Labs Lab 11/16/15 0605 11/16/15 0719 11/17/15 0401 11/18/15 0514 11/19/15 0415  WBC  --   --  10.2  --   --   CREATININE 0.80 0.74 1.32* 2.23* 1.82*    Estimated Creatinine Clearance: 60.9 mL/min (by C-G formula based on Cr of 1.82).    Allergies  Allergen Reactions  . Acetaminophen     Fits as a child "seizures-like"  . Caffeine     Tense, anxiety, increased urination  . Lisinopril Rash    Rash with lisinopril; but fosinopril is ok per patient    Antimicrobials this admission: Vanc 2/13>>2/14; Resumed 2/20 > Cefepime x 1 2/13; Resumed 2/20 >  Dose adjustments this admission: n/a  Microbiology results: 2/13 BCx x 2 > neg 2/15 Sputum Cx > normal flora 2/17 AFB > no AFB seen (pending x 6 wks) 2/19 BCx pending   Bonnita Nasuti Pharm.D. CPP, BCPS Clinical Pharmacist [phone] 11/19/2015 11:45 AM

## 2015-11-20 DIAGNOSIS — F3162 Bipolar disorder, current episode mixed, moderate: Secondary | ICD-10-CM

## 2015-11-20 LAB — GLUCOSE, CAPILLARY
GLUCOSE-CAPILLARY: 139 mg/dL — AB (ref 65–99)
GLUCOSE-CAPILLARY: 178 mg/dL — AB (ref 65–99)
GLUCOSE-CAPILLARY: 156 mg/dL — AB (ref 65–99)
Glucose-Capillary: 89 mg/dL (ref 65–99)

## 2015-11-20 LAB — BASIC METABOLIC PANEL
ANION GAP: 13 (ref 5–15)
BUN: 44 mg/dL — ABNORMAL HIGH (ref 6–20)
CALCIUM: 8.1 mg/dL — AB (ref 8.9–10.3)
CHLORIDE: 93 mmol/L — AB (ref 101–111)
CO2: 30 mmol/L (ref 22–32)
Creatinine, Ser: 1.18 mg/dL — ABNORMAL HIGH (ref 0.44–1.00)
GFR calc non Af Amer: 54 mL/min — ABNORMAL LOW (ref >60)
GLUCOSE: 94 mg/dL (ref 65–99)
POTASSIUM: 3.5 mmol/L (ref 3.5–5.1)
Sodium: 136 mmol/L (ref 135–145)

## 2015-11-20 MED ORDER — POTASSIUM CHLORIDE CRYS ER 20 MEQ PO TBCR
40.0000 meq | EXTENDED_RELEASE_TABLET | Freq: Two times a day (BID) | ORAL | Status: DC
  Administered 2015-11-20 – 2015-11-21 (×3): 40 meq via ORAL
  Filled 2015-11-20 (×3): qty 2

## 2015-11-20 MED ORDER — FUROSEMIDE 40 MG PO TABS
40.0000 mg | ORAL_TABLET | Freq: Every day | ORAL | Status: DC
  Administered 2015-11-20 – 2015-11-21 (×2): 40 mg via ORAL
  Filled 2015-11-20 (×2): qty 1

## 2015-11-20 MED ORDER — INSULIN DETEMIR 100 UNIT/ML ~~LOC~~ SOLN
55.0000 [IU] | Freq: Every day | SUBCUTANEOUS | Status: DC
  Filled 2015-11-20: qty 0.55

## 2015-11-20 NOTE — Progress Notes (Signed)
RT placed patient CPAP auto 56max and 40min. Patient tolerating well. RT will continue to monitor as needed.

## 2015-11-20 NOTE — Progress Notes (Signed)
Patient known to me from previous admission, she is active with [facility] / she will get her prescriptions fill there also. She was arranged with [place] services with [facility] but per [facility] with [facility], they tried for weeks but were not successful in reaching her. F/U apt will be made at discharge. Mindi Slicker [facility] [phone]

## 2015-11-20 NOTE — Progress Notes (Signed)
Inpatient Diabetes Program Recommendations  AACE/ADA: New Consensus Statement on Inpatient Glycemic Control (2015)  Target Ranges:  Prepandial:   less than 140 mg/dL      Peak postprandial:   less than 180 mg/dL (1-2 hours)      Critically ill patients:  140 - 180 mg/dL   Review of Glycemic ControlResults for [name], [name] (MRN [id number]) as of 11/20/2015 15:32  Ref. Range 11/19/2015 11:59 11/19/2015 16:22 11/19/2015 21:31 11/20/2015 06:17 11/20/2015 11:21  Glucose-Capillary Latest Ref Range: 65-99 mg/dL 190 (H) 250 (H) 165 (H) 89 139 (H)    Diabetes history: Type 2 diabetes Outpatient Diabetes medications: Glucotrol 5 mg daily, Metformin 1000 mg bid Current orders for Inpatient glycemic control:  Novolog moderate tid with meals and HS, Levemir 55 units bid  Inpatient Diabetes Program Recommendations:   Referral received.  Note that patient will need insulin at discharge.  Talked with patient and mother regarding this.  They state that patient had been on insulin in the past but had to be stopped due to hypoglycemia which required calling EMT.  When I discussed restart of insulin patient was concerned due to history of low blood sugars.  After discussing past history, patient states that she thinks it will be okay to just do the long acting insulin but not the Novolog.  According to case management note, patient can get medications at the [facility].  Will need close follow-up with Dr. Doreene Burke at [facility].   Discussed with Dr. Broadus John.  She states she will order Levemir once daily and metformin at discharge.  Note that patient has a Medical background and knows about diabetes . She admits that she struggles with lifestyle modification and exercise.   Will follow.  Thanks, [name], RN, BC-ADM Inpatient Diabetes Coordinator Pager [phone] (8a-5p)

## 2015-11-20 NOTE — Progress Notes (Signed)
[facility] PROGRESS NOTE    Progress Note   [name] JHE:[id number] DOB: [birth year] DOA: 11/11/2015 PCP: [name], MD   Brief Narrative:   [name] is an 50 y.o. female With PMH of DM 2 diastolic heart failure bipolar disorder and noncomplaince that come in with chronic cough now productive, denies any fever. She was recently treated with with antibiotics which she never filled. She saw her PCP who order a CT ans showed hilar adenopathy, confirm with CT chest on this admission with "ground glass appearance". She was treated with abx for 24hrs on admission which were then d/c. PCCM consult rec CT chest in 3-4 weeks as an outpatient once this episode has resolved. She is non complaint with her meds her weight has increase significantly since discharge and has diurese well on lasix IV. Her SOB has improved. Now with Productive cough and fever. She develop fever on 2.20.2017 and was started on empiric abx U/a and CXR unchanged, was started on Vanc and cefepime  Assessment/Plan:   Dyspnea and mediastinal adenopathy and ground glass changes: -dyspnea multifactorial from CHF/OSA/OHS and parenchymal lung disease of unclear etiology -improving with diuresis -CT imaging of the chest done on 11/11/2015 showed multiple hilar adenopathy with groundglass appearance. -AFP no acid fast bacilli seen and serum Quanteferon gold is negative, respiratory culture polymicorbial -She was spiking fevers with a productive cough -started vancomycin and cefepime on 2.20.2017. -now improved and afebrile, with negative cultures will DC Abx and observe -FU with Pulm with repeat CT chest in 58month -change Lasix to PO  Fever/?HCAP: -With productive cough, was started empirically on Vanc and cefepime 2.20.2017 -see above, DC Abx  Acute on Chronic diastolic congestive heart failure ([place]: -was on diuretics, ARB and creatinine started to rise, diuretics and ARB held -negative 13L -Cr now  improving. -resume lasix today-PO  OSA (obstructive sleep apnea)- non compliant with C-pap Noncompliant with her C-Pap  Essential HTN (hypertension): Well controlled on metoprolol.  Poorly controlled Diabetes (HRhea -now improved,   Cont.Levemir to 55 units twice a day plus sliding scale insulin. -Insulin teaching, DM RN consult appreciated  Bipolar disorder 1: Resumed seroquel and remeron. Currently stable.  DVT Prophylaxis - Lovenox ordered.  Family Communication: none Disposition Plan: Home tomorrow if stable Code Status: Full Code    Code Status Orders        Start     Ordered   11/12/15 0215  Full code   Continuous     11/12/15 0214    Code Status History    Date Active Date Inactive Code Status Order ID Comments User Context   10/04/2015 12:27 AM 10/06/2015  5:21 PM Full Code [id number] CEdwin Dada MD ED   12/06/2013 11:52 PM 12/10/2013  2:33 PM Full Code [id number] PBerle Mull MD ED   11/03/2013 12:19 AM 11/07/2013  7:23 PM Full Code [id number] ALeone Brand MD Inpatient   06/14/2012 11:22 PM 06/15/2012  8:32 PM Full Code [id number] FPamella Pert MD ED        IV Access:    Peripheral IV   Procedures and diagnostic studies:   No results found.   Medical Consultants:    None.  Anti-Infectives:   Anti-infectives    Start     Dose/Rate Route Frequency Ordered Stop   11/19/15 1800  ceFEPIme (MAXIPIME) 1 g in dextrose 5 % 50 mL IVPB  Status:  Discontinued     1 g 100 mL/hr over 30 Minutes  Intravenous Every 8 hours 11/19/15 1138 11/20/15 0829   11/19/15 1200  vancomycin (VANCOCIN) 1,250 mg in sodium chloride 0.9 % 250 mL IVPB  Status:  Discontinued     1,250 mg 166.7 mL/hr over 90 Minutes Intravenous Every 12 hours 11/19/15 1138 11/20/15 0829   11/18/15 1330  ceFEPIme (MAXIPIME) 1 g in dextrose 5 % 50 mL IVPB  Status:  Discontinued     1 g 100 mL/hr over 30 Minutes Intravenous Every 12 hours 11/18/15 1241 11/19/15 1138   11/18/15 1315   vancomycin (VANCOCIN) 2,500 mg in sodium chloride 0.9 % 500 mL IVPB     2,500 mg 250 mL/hr over 120 Minutes Intravenous  Once 11/18/15 1304 11/18/15 1546   11/17/15 1715  levofloxacin (LEVAQUIN) tablet 750 mg  Status:  Discontinued     750 mg Oral Daily 11/17/15 1707 11/17/15 1707   11/12/15 0800  vancomycin (VANCOCIN) 1,500 mg in sodium chloride 0.9 % 500 mL IVPB  Status:  Discontinued     1,500 mg 250 mL/hr over 120 Minutes Intravenous Every 8 hours 11/11/15 2154 11/12/15 0214   11/11/15 2200  vancomycin (VANCOCIN) 2,500 mg in sodium chloride 0.9 % 500 mL IVPB     2,500 mg 250 mL/hr over 120 Minutes Intravenous  Once 11/11/15 2147 11/12/15 0022   11/11/15 2145  ceFEPIme (MAXIPIME) 2 g in dextrose 5 % 50 mL IVPB     2 g 100 mL/hr over 30 Minutes Intravenous  Once 11/11/15 2144 11/12/15 0030      Subjective:    [name] she relates her SOB is improved, fever has resolved continues to have a productive cough.  Objective:    Filed Vitals:   11/19/15 2150 11/19/15 2154 11/19/15 2212 11/20/15 0523  BP:   113/59 119/65  Pulse:   61 56  Temp:    97.9 F (36.6 C)  TempSrc:    Oral  Resp:    16  Height:      Weight:    162.478 kg (358 lb 3.2 oz)  SpO2: 97% 97%  97%    Intake/Output Summary (Last 24 hours) at 11/20/15 0830 Last data filed at 11/20/15 0316  Gross per 24 hour  Intake   1347 ml  Output   1101 ml  Net    246 ml   Filed Weights   11/18/15 0628 11/19/15 0602 11/20/15 0523  Weight: 157.217 kg (346 lb 9.6 oz) 160.029 kg (352 lb 12.8 oz) 162.478 kg (358 lb 3.2 oz)    Exam: Gen:  NAD, morbidly obese Cardiovascular:  RRR. Chest and lungs:   Good air movement clear to auscultation  Abdomen:  Abdomen soft, NT/ND, + BS Extremities:  1+ lower extremity edema   Data Reviewed:    Labs: Basic Metabolic Panel:  Recent Labs Lab 11/16/15 0719 11/17/15 0401 11/18/15 0514 11/19/15 0415 11/20/15 0550  NA 138 135 134* 132* 136  K 4.1 4.1 3.6 4.0 3.5  CL  93* 91* 91* 90* 93*  CO2 34* 33* _0 GLUCOSE 195* 255* 233* 182* 94  BUN 13 24* 42* 50* 44*  CREATININE 0.74 1.32* 2.23* 1.82* 1.18*  CALCIUM 9.1 9.0 8.1* 7.8* 8.1*   GFR Estimated Creatinine Clearance: 94.9 mL/min (by C-G formula based on Cr of 1.18). Liver Function Tests: No results for input(s): AST, ALT, ALKPHOS, BILITOT, PROT, ALBUMIN in the last 168 hours. No results for input(s): LIPASE, AMYLASE in the last 168 hours. No results for input(s): AMMONIA in  the last 168 hours. Coagulation profile No results for input(s): INR, PROTIME in the last 168 hours.  CBC:  Recent Labs Lab 11/17/15 0401  WBC 10.2  HGB 10.1*  HCT 33.8*  MCV 82.0  PLT 267   Cardiac Enzymes: No results for input(s): CKTOTAL, CKMB, CKMBINDEX, TROPONINI in the last 168 hours. BNP (last 3 results) No results for input(s): PROBNP in the last 8760 hours. CBG:  Recent Labs Lab 11/19/15 0559 11/19/15 1159 11/19/15 1622 11/19/15 2131 11/20/15 0617  GLUCAP 195* 190* 250* 165* 89   D-Dimer: No results for input(s): DDIMER in the last 72 hours. Hgb A1c: No results for input(s): HGBA1C in the last 72 hours. Lipid Profile: No results for input(s): CHOL, HDL, LDLCALC, TRIG, CHOLHDL, LDLDIRECT in the last 72 hours. Thyroid function studies: No results for input(s): TSH, T4TOTAL, T3FREE, THYROIDAB in the last 72 hours.  Invalid input(s): FREET3 Anemia work up: No results for input(s): VITAMINB12, FOLATE, FERRITIN, TIBC, IRON, RETICCTPCT in the last 72 hours. Sepsis Labs:  Recent Labs Lab 11/17/15 0401  WBC 10.2   Microbiology Recent Results (from the past 240 hour(s))  Blood culture (routine x 2)     Status: None   Collection Time: 11/11/15  9:56 PM  Result Value Ref Range Status   Specimen Description BLOOD RIGHT HAND  Final   Special Requests BOTTLES DRAWN AEROBIC AND ANAEROBIC 5CC  Final   Culture NO GROWTH 5 DAYS  Final   Report Status 11/16/2015 FINAL  Final  Blood culture  (routine x 2)     Status: None   Collection Time: 11/11/15 10:07 PM  Result Value Ref Range Status   Specimen Description BLOOD LEFT HAND  Final   Special Requests BOTTLES DRAWN AEROBIC AND ANAEROBIC 5CC  Final   Culture NO GROWTH 5 DAYS  Final   Report Status 11/16/2015 FINAL  Final  Rapid strep screen (not at [facility])     Status: None   Collection Time: 11/12/15  2:30 PM  Result Value Ref Range Status   Streptococcus, Group A Screen (Direct) NEGATIVE NEGATIVE Final    Comment: (NOTE) A Rapid Antigen test may result negative if the antigen level in the sample is below the detection level of this test. The FDA has not cleared this test as a stand-alone test therefore the rapid antigen negative result has reflexed to a Group A Strep culture.   MRSA PCR Screening     Status: None   Collection Time: 11/12/15  2:30 PM  Result Value Ref Range Status   MRSA by PCR NEGATIVE NEGATIVE Final    Comment:        The GeneXpert MRSA Assay (FDA approved for NASAL specimens only), is one component of a comprehensive MRSA colonization surveillance program. It is not intended to diagnose MRSA infection nor to guide or monitor treatment for MRSA infections.   Culture, group A strep     Status: None   Collection Time: 11/12/15  2:30 PM  Result Value Ref Range Status   Specimen Description THROAT  Final   Special Requests NONE Reflexed from [id number]  Final   Culture NO GROUP A STREP (S.PYOGENES) ISOLATED  Final   Report Status 11/15/2015 FINAL  Final  Culture, respiratory (NON-Expectorated)     Status: None   Collection Time: 11/13/15  1:07 AM  Result Value Ref Range Status   Specimen Description EXPECTORATED SPUTUM  Final   Special Requests NONE  Final   Gram Stain   Final  MODERATE WBC PRESENT,BOTH PMN AND MONONUCLEAR MODERATE SQUAMOUS EPITHELIAL CELLS PRESENT ABUNDANT GRAM POSITIVE COCCI IN PAIRS IN CHAINS ABUNDANT GRAM POSITIVE RODS RARE GRAM NEGATIVE RODS Performed at [facility]    Culture   Final    NORMAL OROPHARYNGEAL FLORA Performed at [facility]    Report Status 11/16/2015 FINAL  Final  AFB culture with smear (NOT at [facility])     Status: None (Preliminary result)   Collection Time: 11/13/15 10:20 PM  Result Value Ref Range Status   Specimen Description EXPECTORATED SPUTUM  Final   Special Requests NONE  Final   Acid Fast Smear   Final    NO ACID FAST BACILLI SEEN Performed at [facility]    Culture   Final    CULTURE WILL BE EXAMINED FOR 6 WEEKS BEFORE ISSUING A FINAL REPORT Performed at [facility]    Report Status PENDING  Incomplete  Culture, expectorated sputum-assessment     Status: None   Collection Time: 11/13/15 10:39 PM  Result Value Ref Range Status   Specimen Description EXPECTORATED SPUTUM  Final   Special Requests NONE  Final   Sputum evaluation   Final    THIS SPECIMEN IS ACCEPTABLE. RESPIRATORY CULTURE REPORT TO FOLLOW.   Report Status 11/14/2015 FINAL  Final  AFB culture with smear (NOT at [facility])     Status: None (Preliminary result)   Collection Time: 11/14/15  2:19 PM  Result Value Ref Range Status   Specimen Description SPUTUM  Final   Special Requests NONE  Final   Acid Fast Smear   Final    NO ACID FAST BACILLI SEEN Performed at [facility]    Culture   Final    CULTURE WILL BE EXAMINED FOR 6 WEEKS BEFORE ISSUING A FINAL REPORT Performed at [facility]    Report Status PENDING  Incomplete  Gram stain     Status: None   Collection Time: 11/15/15  2:55 AM  Result Value Ref Range Status   Specimen Description EXPECTORATED SPUTUM  Final   Special Requests NONE  Final   Gram Stain   Final    MODERATE WBC PRESENT,BOTH PMN AND MONONUCLEAR FEW GRAM POSITIVE COCCI IN PAIRS RARE GRAM NEGATIVE RODS RARE GRAM NEGATIVE DIPLOCOCCI    Report Status 11/15/2015 FINAL  Final  AFB culture with smear (NOT at [facility])     Status: None (Preliminary result)   Collection Time: 11/15/15   2:55 AM  Result Value Ref Range Status   Specimen Description EXPECTORATED SPUTUM  Final   Special Requests NONE  Final   Acid Fast Smear   Final    NO ACID FAST BACILLI SEEN Performed at [facility]    Culture   Final    CULTURE WILL BE EXAMINED FOR 6 WEEKS BEFORE ISSUING A FINAL REPORT Performed at [facility]    Report Status PENDING  Incomplete  Culture, blood (routine x 2)     Status: None (Preliminary result)   Collection Time: 11/17/15  9:23 PM  Result Value Ref Range Status   Specimen Description BLOOD LEFT ANTECUBITAL  Final   Special Requests IN PEDIATRIC BOTTLE 2.5CC  Final   Culture NO GROWTH 2 DAYS  Final   Report Status PENDING  Incomplete  Culture, blood (routine x 2)     Status: None (Preliminary result)   Collection Time: 11/17/15  9:26 PM  Result Value Ref Range Status   Specimen Description BLOOD  LEFT ANTECUBITAL  Final   Special Requests IN PEDIATRIC BOTTLE 3CC  Final   Culture NO GROWTH 2 DAYS  Final   Report Status PENDING  Incomplete     Medications:   . amLODipine  10 mg Oral Daily  . benzonatate  100 mg Oral TID  . budesonide (PULMICORT) nebulizer solution  0.5 mg Nebulization BID  . chlorpheniramine-HYDROcodone  5 mL Oral Q12H  . enoxaparin (LOVENOX) injection  0.5 mg/kg Subcutaneous Daily  . fluticasone  2 spray Each Nare Daily  . insulin aspart  0-15 Units Subcutaneous TID WC  . insulin aspart  0-5 Units Subcutaneous QHS  . insulin detemir  55 Units Subcutaneous BID  . insulin starter kit- syringes  1 kit Other Once  . ipratropium-albuterol  3 mL Nebulization BID  . metoprolol succinate  50 mg Oral Daily  . mirtazapine  30 mg Oral QHS  . pantoprazole  40 mg Oral Daily  . potassium chloride  20 mEq Oral BID  . risperidone  4 mg Oral Daily  . rosuvastatin  10 mg Oral q1800   Continuous Infusions:   Time spent: 25 min   LOS: 7 days   [facility]  [facility] Pager [phone]  *Please refer to [email or web address],  password [email or web address] to get updated schedule on who will round on this patient, as hospitalists switch teams weekly. If 7PM-7AM, please contact night-coverage at [email or web address], password [email or web address] for any overnight needs.  11/20/2015, 8:30 AM

## 2015-11-21 LAB — CBC
HEMATOCRIT: 30.9 % — AB (ref 36.0–46.0)
HEMOGLOBIN: 9.6 g/dL — AB (ref 12.0–15.0)
MCH: 26.1 pg (ref 26.0–34.0)
MCHC: 31.1 g/dL (ref 30.0–36.0)
MCV: 84 fL (ref 78.0–100.0)
Platelets: 178 10*3/uL (ref 150–400)
RBC: 3.68 MIL/uL — AB (ref 3.87–5.11)
RDW: 16.4 % — ABNORMAL HIGH (ref 11.5–15.5)
WBC: 4.9 10*3/uL (ref 4.0–10.5)

## 2015-11-21 LAB — BASIC METABOLIC PANEL
Anion gap: 9 (ref 5–15)
BUN: 31 mg/dL — AB (ref 6–20)
CALCIUM: 8.4 mg/dL — AB (ref 8.9–10.3)
CO2: 31 mmol/L (ref 22–32)
CREATININE: 0.86 mg/dL (ref 0.44–1.00)
Chloride: 99 mmol/L — ABNORMAL LOW (ref 101–111)
GFR calc Af Amer: >60 mL/min (ref >60)
GFR calc non Af Amer: >60 mL/min (ref >60)
GLUCOSE: 88 mg/dL (ref 65–99)
POTASSIUM: 3.8 mmol/L (ref 3.5–5.1)
SODIUM: 139 mmol/L (ref 135–145)

## 2015-11-21 LAB — GLUCOSE, CAPILLARY
GLUCOSE-CAPILLARY: 64 mg/dL — AB (ref 65–99)
GLUCOSE-CAPILLARY: 130 mg/dL — AB (ref 65–99)
GLUCOSE-CAPILLARY: 163 mg/dL — AB (ref 65–99)

## 2015-11-21 MED ORDER — FUROSEMIDE 40 MG PO TABS: 40.0000 mg | ORAL_TABLET | Freq: Two times a day (BID) | ORAL | Status: DC

## 2015-11-21 MED ORDER — INSULIN DETEMIR 100 UNIT/ML ~~LOC~~ SOLN
50.0000 [IU] | Freq: Every day | SUBCUTANEOUS | Status: DC
  Administered 2015-11-21: 50 [IU] via SUBCUTANEOUS
  Filled 2015-11-21: qty 0.5

## 2015-11-21 MED ORDER — INSULIN DETEMIR 100 UNIT/ML ~~LOC~~ SOLN: 50.0000 [IU] | Freq: Every day | SUBCUTANEOUS | Status: DC

## 2015-11-21 NOTE — Discharge Summary (Signed)
Physician Discharge Summary  [name] [id number] DOB: [birth year] DOA: 11/11/2015  PCP: [name], MD  Admit date: 11/11/2015 Discharge date: 11/21/2015  Time spent: 45 minutes  Recommendations for Outpatient Follow-up:  1.  Dr.Jegede at [facility] on 11/28/15, newly started on Insulin-levemir, monitor CBGs, titrate dose, monitor compliance with lasix 2.  Dr.Byrum, Pulmonary 3/17, consider FU CT chest in 4-6 weeks to evaluate    Discharge Diagnoses:    Acute on Chronic Diastolic CHF   Ground Glass opacities on Chest CT   OSA (obstructive sleep apnea)- non compliant with C-pap   HTN (hypertension)   Diabetes (HCC)   Bipolar 1 disorder, mixed, moderate (HCC)   Acute diastolic CHF (congestive heart failure) (HCC)   Abnormal CT scan, chest   Chest pain   Atypical chest pain   Dyspnea   Mediastinal adenopathy   Acute on chronic diastolic heart failure [facility])   Discharge Condition: stable  Diet recommendation: Diabetic, low sodium  Filed Weights   11/19/15 0602 11/20/15 0523 11/21/15 0617  Weight: 160.029 kg (352 lb 12.8 oz) 162.478 kg (358 lb 3.2 oz) 159.802 kg (352 lb 4.8 oz)    History of present illness:  Chief Complaint: Abnormal CT Presented with patient's been having chronic cough for past few months , associated with occasional productive greenish sputum, she denied any fever currently. She is originally from [place]. Patient reported worsening shortness of breath now troubled with ambulation with short distances  Hospital Course:  Dyspnea and mediastinal adenopathy and ground glass changes: -dyspnea multifactorial from CHF/OSA/OHS and parenchymal lung disease of unclear etiology -improved with diuresis -CT imaging of the chest done on 11/11/2015 showed multiple hilar adenopathy with groundglass appearance. -AFP no acid fast bacilli seen and serum Quanteferon gold is negative, respiratory culture polymicorbial -She spiked a fever on 2/20 and was started  vancomycin and cefepime -then improved and afebrile ever since, with negative cultures stopped antibiotics after 48hours -FU with Pulm with repeat CT chest in 4-6weeks -changed Lasix to PO 40mg  BID  Acute on Chronic diastolic congestive heart failure ([place]): -was diuresed aggressively, then creatinine started to rise,  -diuretics and ARB held, negative 13L -Cr now improving. -resumed lasix -PO  OSA (obstructive sleep apnea)- non compliant with C-pap -Noncompliant with her C-Pap, emphasized compliance  Essential HTN (hypertension): -Well controlled on metoprolol.  Poorly controlled Diabetes ([place]) -now improved, was started on Levemir to 55 units twice a day plus sliding scale insulin. -now dose cut down to 50units Daily, patient is very worried about hypoglycemia to the extent that she may not use Insulin at home and hence we cut down the Insulin dose to a very conservative dosing to enforce complaince -Insulin teaching completed, DM RN consult appreciated  Bipolar disorder 1: Resumed seroquel and remeron. Currently stable.   Consultations:  Pulmonary  Discharge Exam: Filed Vitals:   11/21/15 1156 11/21/15 1255  BP: 113/77 128/62  Pulse: 62 60  Temp: 98.2 F (36.8 C)   Resp:      General: AAOx3 Cardiovascular: S1S2/RRR Respiratory: CTAB  Discharge Instructions   Discharge Instructions    Amb Referral to Nutrition and Diabetic E    Complete by:  As directed      Diet - low sodium heart healthy    Complete by:  As directed      Diet Carb Modified    Complete by:  As directed      Increase activity slowly    Complete by:  As directed  Current Discharge Medication List    START taking these medications   Details  insulin detemir (LEVEMIR) 100 UNIT/ML injection Inject 0.5 mLs (50 Units total) into the skin daily. Qty: 10 mL, Refills: 2      CONTINUE these medications which have CHANGED   Details  furosemide (LASIX) 40 MG tablet Take 1 tablet (40  mg total) by mouth 2 (two) times daily. Qty: 60 tablet, Refills: 0      CONTINUE these medications which have NOT CHANGED   Details  albuterol (PROVENTIL HFA;VENTOLIN HFA) 108 (90 Base) MCG/ACT inhaler Inhale 1-2 puffs into the lungs every 6 (six) hours as needed for wheezing or shortness of breath. Qty: 1 Inhaler, Refills: 0    amLODipine (NORVASC) 10 MG tablet Take 1 tablet (10 mg total) by mouth daily. Qty: 90 tablet, Refills: 3   Associated Diagnoses: Essential hypertension    fluticasone (FLONASE) 50 MCG/ACT nasal spray Place 2 sprays into both nostrils daily. Qty: 16 g, Refills: 0    Ginger, Zingiber officinalis, (GINGER PO) Take by mouth 2 (two) times daily. Ginger tea    glipiZIDE (GLUCOTROL XL) 5 MG 24 hr tablet Take 1 tablet (5 mg total) by mouth daily with breakfast. Qty: 30 tablet, Refills: 3   Associated Diagnoses: Type 2 diabetes mellitus without complication (HCC)    LORazepam (ATIVAN) 1 MG tablet Take 1 tablet (1 mg total) by mouth at bedtime. Qty: 30 tablet, Refills: 0   Associated Diagnoses: Bipolar 1 disorder, mixed, moderate (HCC)    metFORMIN (GLUCOPHAGE) 1000 MG tablet Take 1 tablet (1,000 mg total) by mouth 2 (two) times daily with a meal. Qty: 60 tablet, Refills: 3   Associated Diagnoses: Type 2 diabetes mellitus without complication (HCC)    metoprolol succinate (TOPROL-XL) 50 MG 24 hr tablet Take 1 tablet (50 mg total) by mouth daily. Take with or immediately following a meal. Qty: 90 tablet, Refills: 3   Associated Diagnoses: HTN (hypertension)    potassium chloride 20 MEQ TBCR Take 20 mEq by mouth 2 (two) times daily. Qty: 60 tablet, Refills: 0    mirtazapine (REMERON) 30 MG tablet Take 1 tablet (30 mg total) by mouth at bedtime. Qty: 30 tablet, Refills: 3   Associated Diagnoses: Depression (emotion)    risperidone (RISPERDAL) 4 MG tablet Take 1 tablet (4 mg total) by mouth daily. Qty: 30 tablet, Refills: 3   Associated Diagnoses: Bipolar 1  disorder, mixed, moderate (HCC)    rosuvastatin (CRESTOR) 10 MG tablet Take 1 tablet (10 mg total) by mouth daily. Qty: 90 tablet, Refills: 3      STOP taking these medications     guaiFENesin-codeine 100-10 MG/5ML syrup      losartan (COZAAR) 100 MG tablet      OVER THE COUNTER MEDICATION      OVER THE COUNTER MEDICATION      clotrimazole (LOTRIMIN) 1 % cream      fluconazole (DIFLUCAN) 150 MG tablet      HYDROcodone-homatropine (HYCODAN) 5-1.5 MG/5ML syrup      levofloxacin (LEVAQUIN) 500 MG tablet      terconazole (TERAZOL 3) 80 MG vaginal suppository      traMADol (ULTRAM) 50 MG tablet        Allergies  Allergen Reactions  . Acetaminophen     Fits as a child "seizures-like"  . Caffeine     Tense, anxiety, increased urination  . Lisinopril Rash    Rash with lisinopril; but fosinopril is ok per patient  Follow-up Information    Follow up with [name], MD On 11/28/2015.   Specialty:  Internal Medicine   Why:  At 4:45pm   Contact information:   [place] [address] [phone]       Follow up with Collene Gobble., MD On 12/13/2015.   Specialty:  Pulmonary Disease   Why:  will need a CT scan of the chest in 3-4 weeks to re-evaluate adenopathy.     /// At 9am with NP Anselm Lis..... Confirmed with Dyann Kief information:   [address] [place] [address] [phone]        The results of significant diagnostics from this hospitalization (including imaging, microbiology, ancillary and laboratory) are listed below for reference.    Significant Diagnostic Studies: Dg Chest 2 View  11/04/2015  CLINICAL DATA:  Cough for several months. EXAM: CHEST  2 VIEW COMPARISON:  10/03/2015 FINDINGS: Peribronchial thickening and diffuse interstitial prominence, stable since prior study in similar to study dating back to 2015, most likely chronic interstitial lung disease. No confluent opacities or effusions. Heart is upper limits  normal in size. No acute bony abnormality. IMPRESSION: Peribronchial thickening and diffuse interstitial prominence, similar to prior studies, likely related to chronic interstitial lung disease. Electronically Signed   By: Rolm Baptise M.D.   On: 11/04/2015 12:28   Ct Angio Chest W/cm &/or Wo Cm  11/11/2015  CLINICAL DATA:  Acute on chronic cough with nausea shortness of breath for 1 week bilateral lower extremity pain and swelling EXAM: CT ANGIOGRAPHY CHEST WITH CONTRAST TECHNIQUE: Multidetector CT imaging of the chest was performed using the standard protocol during bolus administration of intravenous contrast. Multiplanar CT image reconstructions and MIPs were obtained to evaluate the vascular anatomy. CONTRAST:  120mL OMNIPAQUE IOHEXOL 350 MG/ML SOLN COMPARISON:  11/04/2015, 12/22/2010 FINDINGS: There is mild diffuse patchy bilateral ground-glass attenuation. No pleural effusion. No consolidation. Thoracic aorta is not dilated. The heart is mildly enlarged. No significant pericardial effusion. Enlarged mediastinal lymph nodes including 9 mm AP window lymph node. Right hilar lymph node measures 11 mm. Subcarinal adenopathy measures 15 mm. Suboptimal evaluation of third order pulmonary arterial branches due to patient body habitus. Cannot exclude the possibility of peripheral pulmonary emboli therefore. This study is sufficient to exclude emboli in the main pulmonary artery, right and left main pulmonary arteries, and perihilar branches. No acute findings in the upper abdomen. No acute musculoskeletal findings. Review of the MIP images confirms the above findings. IMPRESSION: Increased mediastinal adenopathy and hilar adenopathy and increased ground-glass patchy a attenuation in both lungs when compared to prior CT scan 12/22/2010. Findings suggest possibility of opportunistic infection. Other possibilities including eosinophilic pneumonia, hypersensitivity pneumonitis, or chronic interstitial pneumonia.  Limited study regarding the possibility of pulmonary emboli but there are no large central emboli detected. Electronically Signed   By: Skipper Cliche M.D.   On: 11/11/2015 16:06   Dg Chest Port 1 View  11/17/2015  CLINICAL DATA:  Fever. EXAM: PORTABLE CHEST 1 VIEW COMPARISON:  Radiographs 11/04/2015, CT 11/11/2015 FINDINGS: Cardiomegaly and hilar prominence unchanged. Increased peribronchial cuffing and perihilar opacities from prior exam, may be accentuated by technique. No confluent airspace disease. No large pleural effusion or pneumothorax. Detailed evaluation limited secondary to body habitus and portable technique. IMPRESSION: Increased peribronchial cuffing and perihilar opacities from prior exam, infectious versus pulmonary edema. Findings may be accentuated by portable technique. Cardiomegaly and hilar prominence are stable. Electronically Signed   By: Threasa Beards  Ehinger M.D.   On: 11/17/2015 21:40    Microbiology: Recent Results (from the past 240 hour(s))  Blood culture (routine x 2)     Status: None   Collection Time: 11/11/15  9:56 PM  Result Value Ref Range Status   Specimen Description BLOOD RIGHT HAND  Final   Special Requests BOTTLES DRAWN AEROBIC AND ANAEROBIC 5CC  Final   Culture NO GROWTH 5 DAYS  Final   Report Status 11/16/2015 FINAL  Final  Blood culture (routine x 2)     Status: None   Collection Time: 11/11/15 10:07 PM  Result Value Ref Range Status   Specimen Description BLOOD LEFT HAND  Final   Special Requests BOTTLES DRAWN AEROBIC AND ANAEROBIC 5CC  Final   Culture NO GROWTH 5 DAYS  Final   Report Status 11/16/2015 FINAL  Final  Rapid strep screen (not at [facility])     Status: None   Collection Time: 11/12/15  2:30 PM  Result Value Ref Range Status   Streptococcus, Group A Screen (Direct) NEGATIVE NEGATIVE Final    Comment: (NOTE) A Rapid Antigen test may result negative if the antigen level in the sample is below the detection level of this test. The FDA has  not cleared this test as a stand-alone test therefore the rapid antigen negative result has reflexed to a Group A Strep culture.   MRSA PCR Screening     Status: None   Collection Time: 11/12/15  2:30 PM  Result Value Ref Range Status   MRSA by PCR NEGATIVE NEGATIVE Final    Comment:        The GeneXpert MRSA Assay (FDA approved for NASAL specimens only), is one component of a comprehensive MRSA colonization surveillance program. It is not intended to diagnose MRSA infection nor to guide or monitor treatment for MRSA infections.   Culture, group A strep     Status: None   Collection Time: 11/12/15  2:30 PM  Result Value Ref Range Status   Specimen Description THROAT  Final   Special Requests NONE Reflexed from [id number]  Final   Culture NO GROUP A STREP (S.PYOGENES) ISOLATED  Final   Report Status 11/15/2015 FINAL  Final  Culture, respiratory (NON-Expectorated)     Status: None   Collection Time: 11/13/15  1:07 AM  Result Value Ref Range Status   Specimen Description EXPECTORATED SPUTUM  Final   Special Requests NONE  Final   Gram Stain   Final    MODERATE WBC PRESENT,BOTH PMN AND MONONUCLEAR MODERATE SQUAMOUS EPITHELIAL CELLS PRESENT ABUNDANT GRAM POSITIVE COCCI IN PAIRS IN CHAINS ABUNDANT GRAM POSITIVE RODS RARE GRAM NEGATIVE RODS Performed at [facility]    Culture   Final    NORMAL OROPHARYNGEAL FLORA Performed at [facility]    Report Status 11/16/2015 FINAL  Final  AFB culture with smear (NOT at [facility])     Status: None (Preliminary result)   Collection Time: 11/13/15 10:20 PM  Result Value Ref Range Status   Specimen Description EXPECTORATED SPUTUM  Final   Special Requests NONE  Final   Acid Fast Smear   Final    NO ACID FAST BACILLI SEEN Performed at [facility]    Culture   Final    CULTURE WILL BE EXAMINED FOR 6 WEEKS BEFORE ISSUING A FINAL REPORT Performed at [facility]    Report Status PENDING  Incomplete  Culture,  expectorated sputum-assessment     Status: None   Collection  Time: 11/13/15 10:39 PM  Result Value Ref Range Status   Specimen Description EXPECTORATED SPUTUM  Final   Special Requests NONE  Final   Sputum evaluation   Final    THIS SPECIMEN IS ACCEPTABLE. RESPIRATORY CULTURE REPORT TO FOLLOW.   Report Status 11/14/2015 FINAL  Final  AFB culture with smear (NOT at [facility]     Status: None (Preliminary result)   Collection Time: 11/14/15  2:19 PM  Result Value Ref Range Status   Specimen Description SPUTUM  Final   Special Requests NONE  Final   Acid Fast Smear   Final    NO ACID FAST BACILLI SEEN Performed at [facility]    Culture   Final    CULTURE WILL BE EXAMINED FOR 6 WEEKS BEFORE ISSUING A FINAL REPORT Performed at [facility]    Report Status PENDING  Incomplete  Gram stain     Status: None   Collection Time: 11/15/15  2:55 AM  Result Value Ref Range Status   Specimen Description EXPECTORATED SPUTUM  Final   Special Requests NONE  Final   Gram Stain   Final    MODERATE WBC PRESENT,BOTH PMN AND MONONUCLEAR FEW GRAM POSITIVE COCCI IN PAIRS RARE GRAM NEGATIVE RODS RARE GRAM NEGATIVE DIPLOCOCCI    Report Status 11/15/2015 FINAL  Final  AFB culture with smear (NOT at [facility])     Status: None (Preliminary result)   Collection Time: 11/15/15  2:55 AM  Result Value Ref Range Status   Specimen Description EXPECTORATED SPUTUM  Final   Special Requests NONE  Final   Acid Fast Smear   Final    NO ACID FAST BACILLI SEEN Performed at [facility]    Culture   Final    CULTURE WILL BE EXAMINED FOR 6 WEEKS BEFORE ISSUING A FINAL REPORT Performed at [facility]    Report Status PENDING  Incomplete  Culture, blood (routine x 2)     Status: None (Preliminary result)   Collection Time: 11/17/15  9:23 PM  Result Value Ref Range Status   Specimen Description BLOOD LEFT ANTECUBITAL  Final   Special Requests IN PEDIATRIC BOTTLE 2.5CC  Final   Culture  NO GROWTH 4 DAYS  Final   Report Status PENDING  Incomplete  Culture, blood (routine x 2)     Status: None (Preliminary result)   Collection Time: 11/17/15  9:26 PM  Result Value Ref Range Status   Specimen Description BLOOD LEFT ANTECUBITAL  Final   Special Requests IN PEDIATRIC BOTTLE 3CC  Final   Culture NO GROWTH 4 DAYS  Final   Report Status PENDING  Incomplete     Labs: Basic Metabolic Panel:  Recent Labs Lab 11/17/15 0401 11/18/15 0514 11/19/15 0415 11/20/15 0550 11/21/15 0628  NA 135 134* 132* 136 139  K 4.1 3.6 4.0 3.5 3.8  CL 91* 91* 90* 93* 99*  CO2 33* 29 29 30 31   GLUCOSE 255* 233* 182* 94 88  BUN 24* 42* 50* 44* 31*  CREATININE 1.32* 2.23* 1.82* 1.18* 0.86  CALCIUM 9.0 8.1* 7.8* 8.1* 8.4*   Liver Function Tests: No results for input(s): AST, ALT, ALKPHOS, BILITOT, PROT, ALBUMIN in the last 168 hours. No results for input(s): LIPASE, AMYLASE in the last 168 hours. No results for input(s): AMMONIA in the last 168 hours. CBC:  Recent Labs Lab 11/17/15 0401 11/21/15 0628  WBC 10.2 4.9  HGB 10.1* 9.6*  HCT 33.8* 30.9*  MCV 82.0  84.0  PLT 267 178   Cardiac Enzymes: No results for input(s): CKTOTAL, CKMB, CKMBINDEX, TROPONINI in the last 168 hours. BNP: BNP (last 3 results)  Recent Labs  11/04/15 1239 11/11/15 2014  BNP 164.2* 130.3*    ProBNP (last 3 results) No results for input(s): PROBNP in the last 8760 hours.  CBG:  Recent Labs Lab 11/20/15 1633 11/20/15 2128 11/21/15 0609 11/21/15 0644 11/21/15 1154  GLUCAP 178* 156* 64* 130* 163*       SignedDomenic Polite MD.  [facility] 11/21/2015, 2:05 PM

## 2015-11-21 NOTE — Progress Notes (Signed)
Orders received for pt discharge.  Discharge summary printed and reviewed with pt.  Explained medication regimen, and pt had no further questions at this time.  IV removed and site remains clean, dry, intact.  Telemetry removed.  Pt in stable condition and awaiting transport. 

## 2015-11-22 LAB — CULTURE, BLOOD (ROUTINE X 2)
Culture: NO GROWTH
Culture: NO GROWTH

## 2015-11-27 ENCOUNTER — Ambulatory Visit (HOSPITAL_BASED_OUTPATIENT_CLINIC_OR_DEPARTMENT_OTHER): Payer: Self-pay | Attending: Internal Medicine | Admitting: Radiology

## 2015-11-27 DIAGNOSIS — G4733 Obstructive sleep apnea (adult) (pediatric): Secondary | ICD-10-CM | POA: Insufficient documentation

## 2015-11-27 DIAGNOSIS — R0683 Snoring: Secondary | ICD-10-CM | POA: Insufficient documentation

## 2015-11-27 DIAGNOSIS — E119 Type 2 diabetes mellitus without complications: Secondary | ICD-10-CM | POA: Insufficient documentation

## 2015-11-27 DIAGNOSIS — I10 Essential (primary) hypertension: Secondary | ICD-10-CM | POA: Insufficient documentation

## 2015-11-27 DIAGNOSIS — I493 Ventricular premature depolarization: Secondary | ICD-10-CM | POA: Insufficient documentation

## 2015-11-28 ENCOUNTER — Telehealth: Payer: Self-pay | Admitting: *Deleted

## 2015-11-28 ENCOUNTER — Encounter: Payer: Self-pay | Admitting: Internal Medicine

## 2015-11-28 ENCOUNTER — Ambulatory Visit: Payer: Self-pay | Attending: Internal Medicine | Admitting: Internal Medicine

## 2015-11-28 VITALS — BP 127/89 | HR 81 | Temp 98.3°F | Resp 20 | Ht 67.0 in | Wt 338.0 lb

## 2015-11-28 DIAGNOSIS — E119 Type 2 diabetes mellitus without complications: Secondary | ICD-10-CM | POA: Insufficient documentation

## 2015-11-28 DIAGNOSIS — Z888 Allergy status to other drugs, medicaments and biological substances status: Secondary | ICD-10-CM | POA: Insufficient documentation

## 2015-11-28 DIAGNOSIS — R590 Localized enlarged lymph nodes: Secondary | ICD-10-CM | POA: Insufficient documentation

## 2015-11-28 DIAGNOSIS — G4733 Obstructive sleep apnea (adult) (pediatric): Secondary | ICD-10-CM | POA: Insufficient documentation

## 2015-11-28 DIAGNOSIS — F319 Bipolar disorder, unspecified: Secondary | ICD-10-CM | POA: Insufficient documentation

## 2015-11-28 DIAGNOSIS — I10 Essential (primary) hypertension: Secondary | ICD-10-CM | POA: Insufficient documentation

## 2015-11-28 DIAGNOSIS — R079 Chest pain, unspecified: Secondary | ICD-10-CM | POA: Insufficient documentation

## 2015-11-28 DIAGNOSIS — E0590 Thyrotoxicosis, unspecified without thyrotoxic crisis or storm: Secondary | ICD-10-CM | POA: Insufficient documentation

## 2015-11-28 DIAGNOSIS — Z9119 Patient's noncompliance with other medical treatment and regimen: Secondary | ICD-10-CM | POA: Insufficient documentation

## 2015-11-28 DIAGNOSIS — Z0000 Encounter for general adult medical examination without abnormal findings: Secondary | ICD-10-CM

## 2015-11-28 DIAGNOSIS — E662 Morbid (severe) obesity with alveolar hypoventilation: Secondary | ICD-10-CM | POA: Insufficient documentation

## 2015-11-28 DIAGNOSIS — I5032 Chronic diastolic (congestive) heart failure: Secondary | ICD-10-CM | POA: Insufficient documentation

## 2015-11-28 DIAGNOSIS — I119 Hypertensive heart disease without heart failure: Secondary | ICD-10-CM | POA: Insufficient documentation

## 2015-11-28 DIAGNOSIS — Z79899 Other long term (current) drug therapy: Secondary | ICD-10-CM | POA: Insufficient documentation

## 2015-11-28 DIAGNOSIS — F411 Generalized anxiety disorder: Secondary | ICD-10-CM | POA: Insufficient documentation

## 2015-11-28 DIAGNOSIS — Z7984 Long term (current) use of oral hypoglycemic drugs: Secondary | ICD-10-CM | POA: Insufficient documentation

## 2015-11-28 DIAGNOSIS — F3160 Bipolar disorder, current episode mixed, unspecified: Secondary | ICD-10-CM | POA: Insufficient documentation

## 2015-11-28 DIAGNOSIS — F3162 Bipolar disorder, current episode mixed, moderate: Secondary | ICD-10-CM

## 2015-11-28 MED ORDER — METFORMIN HCL 1000 MG PO TABS
1000.0000 mg | ORAL_TABLET | Freq: Two times a day (BID) | ORAL | Status: DC
Start: 2015-11-28 — End: 2016-08-06

## 2015-11-28 MED ORDER — GLIPIZIDE ER 10 MG PO TB24: 10.0000 mg | ORAL_TABLET | Freq: Every day | ORAL | Status: DC

## 2015-11-28 MED FILL — ?METFORMIN HCL 1,000 MG TAB: 1000 | 30 days supply | Qty: 60 | Fill #0

## 2015-11-28 MED FILL — glipiZIDE ER 10 MG TB24: 10 | 30 days supply | Qty: 30 | Fill #0

## 2015-11-28 NOTE — Progress Notes (Signed)
Patient ID: [name], female   DOB: [birth year], 50 y.o.   MRN: [id number]   [name], is a 50 y.o. female  [id number]  [id number]  DOB - 24-Nov-1965  Chief Complaint  Patient presents with  . Follow-up    ED chest pain        Subjective:   [name] is a 50 y.o. female with history of type 2 diabetes mellitus uncontrolled, chronic diastolic heart failure, morbid obesity, bipolar disorder, obstructive sleep apnea and hypertension here today for a hospital discharge follow up visit. Patient was recently admitted to the hospital for dyspnea thought to be multifactorial, likely a combination of obesity hypoventilation syndrome and chronic diastolic heart failure. She also had an abnormal CT scan of the chest showing mediastinal adenopathy and hilar adenopathy as well as groundglass patchy attenuation possibly due to opportunistic infection. Acid-fast bacilli was negative, HIV was nonreactive, she was treated with empiric vancomycin and cefepime, she improved and she was discharged home on oral antibiotics and furosemide 40 mg twice a day. She was newly started on subcutaneous Levemir, titrated up to 55 units twice a day while in the hospital. However since discharge patient has not used this medication. She is known for nonadherence with medical advice including use of CPAP for obstructive sleep apnea. Patient has no new complaints today. She is very well motivated, she is happier than he has been seen lately. She claims she has lost up to 14 pounds since starting Lasix, she feels lighter and enthusiastic. Now she said she is looking for job.    No problems updated.  ALLERGIES: Allergies  Allergen Reactions  . Acetaminophen     Fits as a child "seizures-like"  . Caffeine     Tense, anxiety, increased urination  . Lisinopril Rash    Rash with lisinopril; but fosinopril is ok per patient    PAST MEDICAL HISTORY: Past Medical History  Diagnosis Date  . Insulin  dependent type 2 diabetes mellitus, uncontrolled ([place])   . Chronic diastolic CHF (congestive heart failure) ([place])   . Morbid obesity with BMI of 50.0-59.9, adult ([place])   . Bipolar disease, chronic ([place])   . History of thyrotoxicosis   . OSA (obstructive sleep apnea) 03/06/2011  . Chest pain     a. 2012 Myoview: EF 63%, no isch/infarct  . Chronic diastolic congestive heart failure ([place]) 07/23/2011  . Hypertensive heart disease 10/18/2013    MEDICATIONS AT HOME: Prior to Admission medications   Medication Sig Start Date End Date Taking? Authorizing Provider  albuterol (PROVENTIL HFA;VENTOLIN HFA) 108 (90 Base) MCG/ACT inhaler Inhale 1-2 puffs into the lungs every 6 (six) hours as needed for wheezing or shortness of breath. 11/04/15   Shary Decamp, PA-C  amLODipine (NORVASC) 10 MG tablet Take 1 tablet (10 mg total) by mouth daily. 12/03/14   Lance Bosch, NP  fluticasone (FLONASE) 50 MCG/ACT nasal spray Place 2 sprays into both nostrils daily. 01/02/15   Arnoldo Morale, MD  furosemide (LASIX) 40 MG tablet Take 1 tablet (40 mg total) by mouth 2 (two) times daily. 11/21/15   Domenic Polite, MD  Ginger, Zingiber officinalis, (GINGER PO) Take by mouth 2 (two) times daily. Ginger tea    Historical Provider, MD  glipiZIDE (GLUCOTROL XL) 10 MG 24 hr tablet Take 1 tablet (10 mg total) by mouth daily with breakfast. 11/28/15   Tresa Garter, MD  insulin detemir (LEVEMIR) 100 UNIT/ML injection Inject 0.5 mLs (50 Units total) into the skin daily.  11/21/15   Domenic Polite, MD  LORazepam (ATIVAN) 1 MG tablet Take 1 tablet (1 mg total) by mouth at bedtime. 07/02/14   Tresa Garter, MD  metFORMIN (GLUCOPHAGE) 1000 MG tablet Take 1 tablet (1,000 mg total) by mouth 2 (two) times daily with a meal. 11/28/15   Tresa Garter, MD  metoprolol succinate (TOPROL-XL) 50 MG 24 hr tablet Take 1 tablet (50 mg total) by mouth daily. Take with or immediately following a meal. 11/28/13   Tresa Garter, MD  mirtazapine  (REMERON) 30 MG tablet Take 1 tablet (30 mg total) by mouth at bedtime. Patient not taking: Reported on 11/11/2015 03/07/15   Tresa Garter, MD  potassium chloride 20 MEQ TBCR Take 20 mEq by mouth 2 (two) times daily. 10/06/15   Thurnell Lose, MD  risperidone (RISPERDAL) 4 MG tablet Take 1 tablet (4 mg total) by mouth daily. Patient not taking: Reported on 11/04/2015 08/07/15   Tresa Garter, MD  rosuvastatin (CRESTOR) 10 MG tablet Take 1 tablet (10 mg total) by mouth daily. Patient not taking: Reported on 11/04/2015 06/01/13   Ripudeep Krystal Eaton, MD     Objective:   Filed Vitals:   11/28/15 1600  BP: 127/89  Pulse: 81  Temp: 98.3 F (36.8 C)  TempSrc: Oral  Resp: 20  Height: 5\' 7"  (1.702 m)  Weight: 338 lb (153.316 kg)  SpO2: 94%    Exam General appearance : Awake, alert, not in any distress. Speech Clear. Not toxic looking, morbidly obese HEENT: Atraumatic and Normocephalic, pupils equally reactive to light and accomodation Neck: supple, no JVD. No cervical lymphadenopathy.  Chest:Good air entry bilaterally, no added sounds  CVS: S1 S2 regular, no murmurs.  Abdomen: Bowel sounds present, Non tender and not distended with no gaurding, rigidity or rebound. Extremities: B/L Lower Ext shows no edema, both legs are warm to touch Neurology: Awake alert, and oriented X 3, CN II-XII intact, Non focal Skin:No Rash  Data Review Lab Results  Component Value Date   HGBA1C 11.7* 11/12/2015   HGBA1C 12.1* 10/05/2015   HGBA1C 11.9* 10/04/2015     Assessment & Plan   1. Type 2 diabetes mellitus without complication, without long-term current use of insulin ([place])  Patient refused to use insulin, saying blood sugar is now ranging between 150 and 200. She said she had multiple episodes of hypoglycemia while in the hospital and at a time in her sleep, she broke out in sweat and confused, her BS was 45. She would not use insulin again, she said, plu she is doing a lot of dietrary control  and exercise now.   Increase glipizide to 10 mg - glipiZIDE (GLUCOTROL XL) 10 MG 24 hr tablet; Take 1 tablet (10 mg total) by mouth daily with breakfast.  Dispense: 90 tablet; Refill: 3 - metFORMIN (GLUCOPHAGE) 1000 MG tablet; Take 1 tablet (1,000 mg total) by mouth 2 (two) times daily with a meal.  Dispense: 180 tablet; Refill: 3  2. Essential hypertension  We have discussed target BP range and blood pressure goal. I have advised patient to check BP regularly and to call us back or report to clinic if the numbers are consistently higher than 140/90. We discussed the importance of compliance with medical therapy and DASH diet recommended, consequences of uncontrolled hypertension discussed.   - continue current BP medications  3. Morbid obesity due to excess calories ([facility]) Aim for 30 minutes of exercise most days. Rethink what you drink. Water  is great! Aim for 2-3 Carb Choices per meal (30-45 grams) +/- 1 either way  Aim for 0-15 Carbs per snack if hungry  Include protein in moderation with your meals and snacks  Consider reading food labels for Total Carbohydrate and Fat Grams of foods  Consider checking BG at alternate times per day  Continue taking medication as directed Be mindful about how much sugar you are adding to beverages and other foods. Fruit Punch - find one with no sugar  Measure and decrease portions of carbohydrate foods  Make your plate and don't go back for seconds   4. Bipolar 1 disorder, mixed, moderate ([place]) Follow-up with psychiatrist and therapist Continue current medication.  Patient have been counseled extensively about nutrition and exercise  Return in about 4 weeks (around 12/26/2015) for Follow up HTN, Heart Failure and Hypertension, Generalized Anxiety Disorder. And blood test  The patient was given clear instructions to go to ER or return to medical center if symptoms don't improve, worsen or new problems develop. The patient verbalized  understanding. The patient was told to call to get lab results if they haven't heard anything in the next week.   This note has been created with Surveyor, quantity. Any transcriptional errors are unintentional.    [name], MD, [place], [place], [place], [place] and [place], [place]   11/28/2015, 4:08 PM

## 2015-11-28 NOTE — Progress Notes (Signed)
Patient is here for ED FU  Patient denies pain at this time.  Patient has lost a total of 12 pounds. Patient is in good spirits today.  Patient would like the flu shot today. Patient tolerated flu injection well.

## 2015-11-28 NOTE — Patient Instructions (Signed)
DASH Eating Plan DASH stands for "Dietary Approaches to Stop Hypertension." The DASH eating plan is a healthy eating plan that has been shown to reduce high blood pressure (hypertension). Additional health benefits may include reducing the risk of type 2 diabetes mellitus, heart disease, and stroke. The DASH eating plan may also help with weight loss. WHAT DO I NEED TO KNOW ABOUT THE DASH EATING PLAN? For the DASH eating plan, you will follow these general guidelines:  Choose foods with a percent daily value for sodium of less than 5% (as listed on the food label).  Use salt-free seasonings or herbs instead of table salt or sea salt.  Check with your health care provider or pharmacist before using salt substitutes.  Eat lower-sodium products, often labeled as "lower sodium" or "no salt added."  Eat fresh foods.  Eat more vegetables, fruits, and low-fat dairy products.  Choose whole grains. Look for the word "whole" as the first word in the ingredient list.  Choose fish and skinless chicken or turkey more often than red meat. Limit fish, poultry, and meat to 6 oz (170 g) each day.  Limit sweets, desserts, sugars, and sugary drinks.  Choose heart-healthy fats.  Limit cheese to 1 oz (28 g) per day.  Eat more home-cooked food and less restaurant, buffet, and fast food.  Limit fried foods.  Cook foods using methods other than frying.  Limit canned vegetables. If you do use them, rinse them well to decrease the sodium.  When eating at a restaurant, ask that your food be prepared with less salt, or no salt if possible. WHAT FOODS CAN I EAT? Seek help from a dietitian for individual calorie needs. Grains Whole grain or whole wheat bread. Brown rice. Whole grain or whole wheat pasta. Quinoa, bulgur, and whole grain cereals. Low-sodium cereals. Corn or whole wheat flour tortillas. Whole grain cornbread. Whole grain crackers. Low-sodium crackers. Vegetables Fresh or frozen vegetables  (raw, steamed, roasted, or grilled). Low-sodium or reduced-sodium tomato and vegetable juices. Low-sodium or reduced-sodium tomato sauce and paste. Low-sodium or reduced-sodium canned vegetables.  Fruits All fresh, canned (in natural juice), or frozen fruits. Meat and Other Protein Products Ground beef (85% or leaner), grass-fed beef, or beef trimmed of fat. Skinless chicken or turkey. Ground chicken or turkey. Pork trimmed of fat. All fish and seafood. Eggs. Dried beans, peas, or lentils. Unsalted nuts and seeds. Unsalted canned beans. Dairy Low-fat dairy products, such as skim or 1% milk, 2% or reduced-fat cheeses, low-fat ricotta or cottage cheese, or plain low-fat yogurt. Low-sodium or reduced-sodium cheeses. Fats and Oils Tub margarines without trans fats. Light or reduced-fat mayonnaise and salad dressings (reduced sodium). Avocado. Safflower, olive, or canola oils. Natural peanut or almond butter. Other Unsalted popcorn and pretzels. The items listed above may not be a complete list of recommended foods or beverages. Contact your dietitian for more options. WHAT FOODS ARE NOT RECOMMENDED? Grains White bread. White pasta. White rice. Refined cornbread. Bagels and croissants. Crackers that contain trans fat. Vegetables Creamed or fried vegetables. Vegetables in a cheese sauce. Regular canned vegetables. Regular canned tomato sauce and paste. Regular tomato and vegetable juices. Fruits Dried fruits. Canned fruit in light or heavy syrup. Fruit juice. Meat and Other Protein Products Fatty cuts of meat. Ribs, chicken wings, bacon, sausage, bologna, salami, chitterlings, fatback, hot dogs, bratwurst, and packaged luncheon meats. Salted nuts and seeds. Canned beans with salt. Dairy Whole or 2% milk, cream, half-and-half, and cream cheese. Whole-fat or sweetened yogurt. Full-fat   cheeses or blue cheese. Nondairy creamers and whipped toppings. Processed cheese, cheese spreads, or cheese  curds. Condiments Onion and garlic salt, seasoned salt, table salt, and sea salt. Canned and packaged gravies. Worcestershire sauce. Tartar sauce. Barbecue sauce. Teriyaki sauce. Soy sauce, including reduced sodium. Steak sauce. Fish sauce. Oyster sauce. Cocktail sauce. Horseradish. Ketchup and mustard. Meat flavorings and tenderizers. Bouillon cubes. Hot sauce. Tabasco sauce. Marinades. Taco seasonings. Relishes. Fats and Oils Butter, stick margarine, lard, shortening, ghee, and bacon fat. Coconut, palm kernel, or palm oils. Regular salad dressings. Other Pickles and olives. Salted popcorn and pretzels. The items listed above may not be a complete list of foods and beverages to avoid. Contact your dietitian for more information. WHERE CAN I FIND MORE INFORMATION? National Heart, Lung, and [employer] Institute: www.nhlbi.nih.gov/health/health-topics/topics/dash/   This information is not intended to replace advice given to you by your health care provider. Make sure you discuss any questions you have with your health care provider.   Document Released: 09/03/2011 Document Revised: 10/05/2014 Document Reviewed: 07/19/2013 Elsevier Interactive Patient Education 2016 [employer] Hypertension Hypertension, commonly called high blood pressure, is when the force of blood pumping through your arteries is too strong. Your arteries are the blood vessels that carry blood from your heart throughout your body. A blood pressure reading consists of a higher number over a lower number, such as 110/72. The higher number (systolic) is the pressure inside your arteries when your heart pumps. The lower number (diastolic) is the pressure inside your arteries when your heart relaxes. Ideally you want your blood pressure below 120/80. Hypertension forces your heart to work harder to pump blood. Your arteries may become narrow or stiff. Having untreated or uncontrolled hypertension can cause heart attack, stroke, kidney  disease, and other problems. RISK FACTORS Some risk factors for high blood pressure are controllable. Others are not.  Risk factors you cannot control include:   Race. You may be at higher risk if you are African American.  Age. Risk increases with age.  Gender. Men are at higher risk than women before age 45 years. After age 65, women are at higher risk than men. Risk factors you can control include:  Not getting enough exercise or physical activity.  Being overweight.  Getting too much fat, sugar, calories, or salt in your diet.  Drinking too much alcohol. SIGNS AND SYMPTOMS Hypertension does not usually cause signs or symptoms. Extremely high blood pressure (hypertensive crisis) may cause headache, anxiety, shortness of breath, and nosebleed. DIAGNOSIS To check if you have hypertension, your health care provider will measure your blood pressure while you are seated, with your arm held at the level of your heart. It should be measured at least twice using the same arm. Certain conditions can cause a difference in blood pressure between your right and left arms. A blood pressure reading that is higher than normal on one occasion does not mean that you need treatment. If it is not clear whether you have high blood pressure, you may be asked to return on a different day to have your blood pressure checked again. Or, you may be asked to monitor your blood pressure at home for 1 or more weeks. TREATMENT Treating high blood pressure includes making lifestyle changes and possibly taking medicine. Living a healthy lifestyle can help lower high blood pressure. You may need to change some of your habits. Lifestyle changes may include:  Following the DASH diet. This diet is high in fruits, vegetables, and whole grains.   It is low in salt, red meat, and added sugars.  Keep your sodium intake below 2,300 mg per day.  Getting at least 30-45 minutes of aerobic exercise at least 4 times per  week.  Losing weight if necessary.  Not smoking.  Limiting alcoholic beverages.  Learning ways to reduce stress. Your health care provider may prescribe medicine if lifestyle changes are not enough to get your blood pressure under control, and if one of the following is true:  You are 13-53 years of age and your systolic blood pressure is above 140.  You are 19 years of age or older, and your systolic blood pressure is above 150.  Your diastolic blood pressure is above 90.  You have diabetes, and your systolic blood pressure is over [id number] or your diastolic blood pressure is over 90.  You have kidney disease and your blood pressure is above 140/90.  You have heart disease and your blood pressure is above 140/90. Your personal target blood pressure may vary depending on your medical conditions, your age, and other factors. HOME CARE INSTRUCTIONS  Have your blood pressure rechecked as directed by your health care provider.   Take medicines only as directed by your health care provider. Follow the directions carefully. Blood pressure medicines must be taken as prescribed. The medicine does not work as well when you skip doses. Skipping doses also puts you at risk for problems.  Do not smoke.   Monitor your blood pressure at home as directed by your health care provider. SEEK MEDICAL CARE IF:   You think you are having a reaction to medicines taken.  You have recurrent headaches or feel dizzy.  You have swelling in your ankles.  You have trouble with your vision. SEEK IMMEDIATE MEDICAL CARE IF:  You develop a severe headache or confusion.  You have unusual weakness, numbness, or feel faint.  You have severe chest or abdominal pain.  You vomit repeatedly.  You have trouble breathing. MAKE SURE YOU:   Understand these instructions.  Will watch your condition.  Will get help right away if you are not doing well or get worse.   This information is not intended to  replace advice given to you by your health care provider. Make sure you discuss any questions you have with your health care provider.   Document Released: 09/14/2005 Document Revised: 01/29/2015 Document Reviewed: 07/07/2013 Elsevier Interactive Patient Education 2016 [place]. Diabetes and Foot Care Diabetes may cause you to have problems because of poor blood supply (circulation) to your feet and legs. This may cause the skin on your feet to become thinner, break easier, and heal more slowly. Your skin may become dry, and the skin may peel and crack. You may also have nerve damage in your legs and feet causing decreased feeling in them. You may not notice minor injuries to your feet that could lead to infections or more serious problems. Taking care of your feet is one of the most important things you can do for yourself.  HOME CARE INSTRUCTIONS  Wear shoes at all times, even in the house. Do not go barefoot. Bare feet are easily injured.  Check your feet daily for blisters, cuts, and redness. If you cannot see the bottom of your feet, use a mirror or ask someone for help.  Wash your feet with warm water (do not use hot water) and mild soap. Then pat your feet and the areas between your toes until they are completely dry. Do  not soak your feet as this can dry your skin.  Apply a moisturizing lotion or petroleum jelly (that does not contain alcohol and is unscented) to the skin on your feet and to dry, brittle toenails. Do not apply lotion between your toes.  Trim your toenails straight across. Do not dig under them or around the cuticle. File the edges of your nails with an emery board or nail file.  Do not cut corns or calluses or try to remove them with medicine.  Wear clean socks or stockings every day. Make sure they are not too tight. Do not wear knee-high stockings since they may decrease blood flow to your legs.  Wear shoes that fit properly and have enough cushioning. To break  in new shoes, wear them for just a few hours a day. This prevents you from injuring your feet. Always look in your shoes before you put them on to be sure there are no objects inside.  Do not cross your legs. This may decrease the blood flow to your feet.  If you find a minor scrape, cut, or break in the skin on your feet, keep it and the skin around it clean and dry. These areas may be cleansed with mild soap and water. Do not cleanse the area with peroxide, alcohol, or iodine.  When you remove an adhesive bandage, be sure not to damage the skin around it.  If you have a wound, look at it several times a day to make sure it is healing.  Do not use heating pads or hot water bottles. They may burn your skin. If you have lost feeling in your feet or legs, you may not know it is happening until it is too late.  Make sure your health care provider performs a complete foot exam at least annually or more often if you have foot problems. Report any cuts, sores, or bruises to your health care provider immediately. SEEK MEDICAL CARE IF:   You have an injury that is not healing.  You have cuts or breaks in the skin.  You have an ingrown nail.  You notice redness on your legs or feet.  You feel burning or tingling in your legs or feet.  You have pain or cramps in your legs and feet.  Your legs or feet are numb.  Your feet always feel cold. SEEK IMMEDIATE MEDICAL CARE IF:   There is increasing redness, swelling, or pain in or around a wound.  There is a red line that goes up your leg.  Pus is coming from a wound.  You develop a fever or as directed by your health care provider.  You notice a bad smell coming from an ulcer or wound.   This information is not intended to replace advice given to you by your health care provider. Make sure you discuss any questions you have with your health care provider.   Document Released: 09/11/2000 Document Revised: 05/17/2013 Document Reviewed:  02/21/2013 Elsevier Interactive Patient Education 2016 [employer]. Diabetes and Exercise Exercising regularly is important. It is not just about losing weight. It has many health benefits, such as:  Improving your overall fitness, flexibility, and endurance.  Increasing your bone density.  Helping with weight control.  Decreasing your body fat.  Increasing your muscle strength.  Reducing stress and tension.  Improving your overall health. People with diabetes who exercise gain additional benefits because exercise:  Reduces appetite.  Improves the body's use of blood sugar (glucose).  Helps lower or control blood glucose.  Decreases blood pressure.  Helps control blood lipids (such as cholesterol and triglycerides).  Improves the body's use of the hormone insulin by:  Increasing the body's insulin sensitivity.  Reducing the body's insulin needs.  Decreases the risk for heart disease because exercising:  Lowers cholesterol and triglycerides levels.  Increases the levels of good cholesterol (such as high-density lipoproteins [HDL]) in the body.  Lowers blood glucose levels. YOUR ACTIVITY PLAN  Choose an activity that you enjoy, and set realistic goals. To exercise safely, you should begin practicing any new physical activity slowly, and gradually increase the intensity of the exercise over time. Your health care provider or diabetes educator can help create an activity plan that works for you. General recommendations include:  Encouraging children to engage in at least 60 minutes of physical activity each day.  Stretching and performing strength training exercises, such as yoga or weight lifting, at least 2 times per week.  Performing a total of at least 150 minutes of moderate-intensity exercise each week, such as brisk walking or water aerobics.  Exercising at least 3 days per week, making sure you allow no more than 2 consecutive days to pass without  exercising.  Avoiding long periods of inactivity (90 minutes or more). When you have to spend an extended period of time sitting down, take frequent breaks to walk or stretch. RECOMMENDATIONS FOR EXERCISING WITH TYPE 1 OR TYPE 2 DIABETES   Check your blood glucose before exercising. If blood glucose levels are greater than 240 mg/dL, check for urine ketones. Do not exercise if ketones are present.  Avoid injecting insulin into areas of the body that are going to be exercised. For example, avoid injecting insulin into:  The arms when playing tennis.  The legs when jogging.  Keep a record of:  Food intake before and after you exercise.  Expected peak times of insulin action.  Blood glucose levels before and after you exercise.  The type and amount of exercise you have done.  Review your records with your health care provider. Your health care provider will help you to develop guidelines for adjusting food intake and insulin amounts before and after exercising.  If you take insulin or oral hypoglycemic agents, watch for signs and symptoms of hypoglycemia. They include:  Dizziness.  Shaking.  Sweating.  Chills.  Confusion.  Drink plenty of water while you exercise to prevent dehydration or heat stroke. Body water is lost during exercise and must be replaced.  Talk to your health care provider before starting an exercise program to make sure it is safe for you. Remember, almost any type of activity is better than none.   This information is not intended to replace advice given to you by your health care provider. Make sure you discuss any questions you have with your health care provider.   Document Released: 12/05/2003 Document Revised: 01/29/2015 Document Reviewed: 02/21/2013 Elsevier Interactive Patient Education 2016 [place] Carbohydrate Counting for Diabetes Mellitus Carbohydrate counting is a method for keeping track of the amount of carbohydrates you eat.  Eating carbohydrates naturally increases the level of sugar (glucose) in your blood, so it is important for you to know the amount that is okay for you to have in every meal. Carbohydrate counting helps keep the level of glucose in your blood within normal limits. The amount of carbohydrates allowed is different for every person. A dietitian can help you calculate the amount that is right for  you. Once you know the amount of carbohydrates you can have, you can count the carbohydrates in the foods you want to eat. Carbohydrates are found in the following foods:  Grains, such as breads and cereals.  Dried beans and soy products.  Starchy vegetables, such as potatoes, peas, and corn.  Fruit and fruit juices.  Milk and yogurt.  Sweets and snack foods, such as cake, cookies, candy, chips, soft drinks, and fruit drinks. CARBOHYDRATE COUNTING There are two ways to count the carbohydrates in your food. You can use either of the methods or a combination of both. Reading the "Nutrition Facts" on [place] The "Nutrition Facts" is an area that is included on the labels of almost all packaged food and beverages in the [place]. It includes the serving size of that food or beverage and information about the nutrients in each serving of the food, including the grams (g) of carbohydrate per serving.  Decide the number of servings of this food or beverage that you will be able to eat or drink. Multiply that number of servings by the number of grams of carbohydrate that is listed on the label for that serving. The total will be the amount of carbohydrates you will be having when you eat or drink this food or beverage. Learning Standard Serving Sizes of Food When you eat food that is not packaged or does not include "Nutrition Facts" on the label, you need to measure the servings in order to count the amount of carbohydrates.A serving of most carbohydrate-rich foods contains about 15 g of carbohydrates.  The following list includes serving sizes of carbohydrate-rich foods that provide 15 g ofcarbohydrate per serving:   1 slice of bread (1 oz) or 1 six-inch tortilla.    of a hamburger bun or English muffin.  4-6 crackers.   cup unsweetened dry cereal.    cup hot cereal.   cup rice or pasta.    cup mashed potatoes or  of a large baked potato.  1 cup fresh fruit or one small piece of fruit.    cup canned or frozen fruit or fruit juice.  1 cup milk.   cup plain fat-free yogurt or yogurt sweetened with artificial sweeteners.   cup cooked dried beans or starchy vegetable, such as peas, corn, or potatoes.  Decide the number of standard-size servings that you will eat. Multiply that number of servings by 15 (the grams of carbohydrates in that serving). For example, if you eat 2 cups of strawberries, you will have eaten 2 servings and 30 g of carbohydrates (2 servings x 15 g = 30 g). For foods such as soups and casseroles, in which more than one food is mixed in, you will need to count the carbohydrates in each food that is included. EXAMPLE OF CARBOHYDRATE COUNTING Sample Dinner  3 oz chicken breast.   cup of brown rice.   cup of corn.  1 cup milk.   1 cup strawberries with sugar-free whipped topping.  Carbohydrate Calculation Step 1: Identify the foods that contain carbohydrates:   Rice.   Corn.   Milk.   Strawberries. Step 2:Calculate the number of servings eaten of each:   2 servings of rice.   1 serving of corn.   1 serving of milk.   1 serving of strawberries. Step 3: Multiply each of those number of servings by 15 g:   2 servings of rice x 15 g = 30 g.   1 serving of corn  x 15 g = 15 g.   1 serving of milk x 15 g = 15 g.   1 serving of strawberries x 15 g = 15 g. Step 4: Add together all of the amounts to find the total grams of carbohydrates eaten: 30 g + 15 g + 15 g + 15 g = 75 g.   This information is not intended to  replace advice given to you by your health care provider. Make sure you discuss any questions you have with your health care provider.   Document Released: 09/14/2005 Document Revised: 10/05/2014 Document Reviewed: 08/11/2013 Elsevier Interactive Patient Education [employer].

## 2015-11-28 NOTE — Telephone Encounter (Signed)
Patient verified DOB Patient stated she is able to come to the office at 3 for her appt today. MA thanks patient and patient had no further questions at this time.

## 2015-12-01 DIAGNOSIS — G4733 Obstructive sleep apnea (adult) (pediatric): Secondary | ICD-10-CM

## 2015-12-01 NOTE — Progress Notes (Signed)
Patient Name: [name], [name] Date: 11/27/2015 Gender: Female D.O.B: August 08, 1966 Age (years): 49 Referring Provider: [name] Height (inches): 41 Interpreting Physician: Baird Lyons MD, ABSM Weight (lbs): 344 RPSGT: Carolin Coy BMI: 2 MRN: [id number] Neck Size: 17.00 CLINICAL INFORMATION Sleep Study Type: Split Night CPAP   Indication for sleep study: Diabetes, Hypertension, OSA, Snoring   Epworth Sleepiness Score: 16/24  SLEEP STUDY TECHNIQUE As per the AASM Manual for the Scoring of Sleep and Associated Events v2.3 (April 2016) with a hypopnea requiring 4% desaturations. The channels recorded and monitored were frontal, central and occipital EEG, electrooculogram (EOG), submentalis EMG (chin), nasal and oral airflow, thoracic and abdominal wall motion, anterior tibialis EMG, snore microphone, electrocardiogram, and pulse oximetry. Continuous positive airway pressure (CPAP) was initiated when the patient met split night criteria and was titrated according to treat sleep-disordered breathing.  MEDICATIONS Medications taken by the patient : charted for review Medications administered by patient during sleep study : No sleep medicine administered.  RESPIRATORY PARAMETERS Diagnostic Total AHI (/hr): 90.5 RDI (/hr): 97.7 OA Index (/hr): 5.7 CA Index (/hr): 2.9 REM AHI (/hr): 98.2 NREM AHI (/hr): 90.2 Supine AHI (/hr): N/A Non-supine AHI (/hr): 90.51 Min O2 Sat (%): 83.00 Mean O2 (%): 92.47 Time below 88% (min): 5.6   Titration Optimal Pressure (cm): 19 AHI at Optimal Pressure (/hr): 76.5 Min O2 at Optimal Pressure (%): 90.0 Supine % at Optimal (%): 0 Sleep % at Optimal (%): 100    SLEEP ARCHITECTURE The recording time for the entire night was 402.5 minutes. During a baseline period of 198.9 minutes, the patient slept for 167.7 minutes in REM and nonREM, yielding a sleep efficiency of 84.3%. Sleep onset after lights out was 2.1 minutes with a REM latency of  140.5 minutes. The patient spent 31.30% of the night in stage N1 sleep, 65.42% in stage N2 sleep, 0.00% in stage N3 and 3.28% in REM. During the titration period of 185.8 minutes, the patient slept for 168.4 minutes in REM and nonREM, yielding a sleep efficiency of 90.6%. Sleep onset after CPAP initiation was 5.9 minutes with a REM latency of 73.5 minutes. The patient spent 21.08% of the night in stage N1 sleep, 57.83% in stage N2 sleep, 0.00% in stage N3 and 21.08% in REM.  CARDIAC DATA The 2 lead EKG demonstrated sinus rhythm. The mean heart rate was 80.31 beats per minute. Other EKG findings include: PVCs.  LEG MOVEMENT DATA The total Periodic Limb Movements of Sleep (PLMS) were 7. The PLMS index was 1.21 .  IMPRESSIONS - Severe obstructive sleep apnea occurred during the diagnostic portion of the study (AHI = 90.5/hour). An optimal PAP pressure was selected for this patient ( 19 cm of water) - No significant central sleep apnea occurred during the diagnostic portion of the study (CAI = 2.9/hour). - Moderate oxygen desaturation was noted during the diagnostic portion of the study (Min O2 =83.00%). - The patient snored with Moderate snoring volume during the diagnostic portion of the study. - No cardiac abnormalities were noted during this study. - Clinically significant periodic limb movements did not occur during sleep.  DIAGNOSIS - Obstructive Sleep Apnea (327.23 [G47.33 ICD-10])  RECOMMENDATIONS - Trial of CPAP therapy on 19 cm H2O with a Medium size Resmed Full Face Mask AirFit F20 mask and heated humidification. - Avoid alcohol, sedatives and other CNS depressants that may worsen sleep apnea and disrupt normal sleep architecture. - Sleep hygiene should be reviewed to assess factors that may improve sleep  quality. - Weight management and regular exercise should be initiated or continued.  Deneise Lever Diplomate, American Board of Sleep Medicine  ELECTRONICALLY SIGNED ON:   12/01/2015, 9:12 AM [place] PH: [phone]   FX: [phone] [place]

## 2015-12-03 ENCOUNTER — Telehealth: Payer: Self-pay

## 2015-12-03 NOTE — Telephone Encounter (Signed)
Returned phone call to lab Patient had a positive sputum culture Lab will fax results to me so provider can view

## 2015-12-04 ENCOUNTER — Other Ambulatory Visit: Payer: Self-pay | Admitting: Internal Medicine

## 2015-12-04 DIAGNOSIS — A310 Pulmonary mycobacterial infection: Secondary | ICD-10-CM

## 2015-12-10 ENCOUNTER — Other Ambulatory Visit: Payer: Self-pay | Admitting: Internal Medicine

## 2015-12-10 MED FILL — risperiDONE 2 MG TABS: 2 | 30 days supply | Qty: 60 | Fill #1

## 2015-12-10 MED FILL — AMLODIPINE BESYLATE 10 MG T: 10 | 30 days supply | Qty: 30 | Fill #0

## 2015-12-10 MED FILL — !VENTOLIN HFA INHALER: 108 (90 BAS | 28 days supply | Qty: 18 | Fill #1

## 2015-12-12 MED FILL — LOSARTAN POTASSIUM 100 MG T: 100 | 30 days supply | Qty: 30 | Fill #0

## 2015-12-12 MED FILL — FUROSEMIDE 80 MG TABLET: 80 | 30 days supply | Qty: 30 | Fill #0

## 2015-12-13 ENCOUNTER — Encounter: Payer: Self-pay | Admitting: Acute Care

## 2015-12-13 ENCOUNTER — Ambulatory Visit (INDEPENDENT_AMBULATORY_CARE_PROVIDER_SITE_OTHER)
Admission: RE | Admit: 2015-12-13 | Discharge: 2015-12-13 | Disposition: A | Discharge status: Home / Self Care | Payer: Self-pay | Source: Ambulatory Visit | Attending: Acute Care | Admitting: Acute Care

## 2015-12-13 ENCOUNTER — Ambulatory Visit (INDEPENDENT_AMBULATORY_CARE_PROVIDER_SITE_OTHER): Payer: Self-pay | Admitting: Acute Care

## 2015-12-13 VITALS — BP 142/88 | HR 83 | Ht 67.0 in | Wt 336.6 lb

## 2015-12-13 DIAGNOSIS — Z8701 Personal history of pneumonia (recurrent): Secondary | ICD-10-CM

## 2015-12-13 DIAGNOSIS — A310 Pulmonary mycobacterial infection: Secondary | ICD-10-CM

## 2015-12-13 DIAGNOSIS — G4733 Obstructive sleep apnea (adult) (pediatric): Secondary | ICD-10-CM

## 2015-12-13 NOTE — Assessment & Plan Note (Signed)
Patient states she is wearing CPAP every night Plan: Continue on CPAP at bedtime. You appear to be benefiting from the treatment Goal is to wear for at least 4-6 hours each night for maximal clinical benefit. Continue to work on weight loss, as the link between excess weight  and sleep apnea is well established.  Do not drive if sleepy.

## 2015-12-13 NOTE — Progress Notes (Signed)
Subjective:    Patient ID: [name], female    DOB: 05-29-1966, 50 y.o.   MRN: [id number]  HPI  49 year old From [place] with chronic diastolic HF, morbid obesity, HTN, IDDM, OSA, DJD, bipolar disorder with psychosis, and OSA. Treated by Dr. Lamonte Sakai. Recent hospitalization For acute on chronic diastolic congestive heart failure/ground glass opacities on chest CT/OSA (obstructive sleep apnea)- non compliant with C-pap.  Significant events/procedures:  Admit date: 11/11/2015 Discharge date: 11/21/2015 Discharge Diagnoses:   Acute on Chronic Diastolic CHF  Ground Glass opacities on Chest CT  OSA (obstructive sleep apnea)- non compliant with C-pap  HTN (hypertension)  Diabetes (HCC)  Bipolar 1 disorder, mixed, moderate (HCC)  Acute diastolic CHF (congestive heart failure) (HCC)  Abnormal CT scan, chest  Chest pain  Atypical chest pain  Dyspnea  Mediastinal adenopathy  Acute on chronic diastolic heart failure (HCC)  Treated with vancomycin and cefepime as inpatient   11/11/15: CXR 2 View: IMPRESSION: Increased peribronchial cuffing and perihilar opacities from prior exam, infectious versus pulmonary edema. Findings may be accentuated by portable technique.  Cardiomegaly and hilar prominence are stable.  12/13/15: IMPRESSION: Improved appearance of the lungs compared to the prior study. Remaining diffuse interstitial changes may reflect mild interstitial edema or chronic interstitial lung disease.  12/13/2015: Follow up Hospital office visit: Patient presents to the office today feeling much better. Chest x-ray personally reviewed by me, shows improved appearance of the lungs. I explained to her that while in the hospital a culture was done. 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 I explained that the treatment is oftentimes lengthy with a combination of antibiotics. I also explained that the decision to treat versus a decision to monitor his based on radiologic findings, bacterial findings, and clinical findings. With this hospital admission, she did have an abnormal CT, done 11/11/2015 indicating increased mediastinal adenopathy and hilar adenopathy with increased groundglass patchy attenuation in both lungs. Suggestive possibly of opportunistic infection. HIV done 11/12/2015 was non reactive. As she is clinically asymptomatic at present time, denies hemoptysis, fever, cough ,night sweats, or unintentional weight loss, we will plan for CT at the beginning of April 2017 to reevaluate now that her acute respiratory distress has resolved. Based on the results of that CT we will discuss treatment plan. I will have patient return to be seen by Dr. Baltazar Apo after the CT is done to determine plan of care of either treatment, or close monitoring.   Current outpatient prescriptions:  .  albuterol (PROVENTIL HFA;VENTOLIN HFA) 108 (90 Base) MCG/ACT inhaler, Inhale 1-2 puffs into the lungs every 6 (six) hours as needed for wheezing or shortness of breath., Disp: 1 Inhaler, Rfl: 0 .  amLODipine (NORVASC) 10 MG tablet, TAKE 1 TABLET BY MOUTH DAILY., Disp: 90 tablet, Rfl: 0 .  fluticasone (FLONASE) 50 MCG/ACT nasal spray, Place 2 sprays into both nostrils daily., Disp: 16 g, Rfl: 0 .  furosemide (LASIX) 40 MG tablet, Take 1 tablet (40 mg total) by mouth 2 (two) times daily., Disp: 60 tablet, Rfl: 0 .  furosemide (LASIX) 80 MG tablet, TAKE 1 TABLET BY MOUTH DAILY., Disp: 30 tablet, Rfl: 0 .  Ginger, Zingiber officinalis, (GINGER PO), Take by mouth 2 (two) times daily. Ginger tea, Disp: , Rfl:  .  glipiZIDE (GLUCOTROL XL) 10 MG 24 hr tablet, Take 1 tablet (10 mg total) by mouth daily  with breakfast., Disp: 90 tablet, Rfl: 3 .  insulin detemir (LEVEMIR) 100 UNIT/ML injection, Inject 0.5 mLs (50 Units total) into the skin daily., Disp: 10 mL, Rfl: 2 .  LORazepam (ATIVAN) 1 MG tablet, Take 1 tablet (1 mg total) by mouth at bedtime., Disp: 30 tablet, Rfl: 0 .  losartan (COZAAR) 100 MG tablet, TAKE 1 TABLET BY MOUTH DAILY., Disp: 30 tablet, Rfl: 3 .  metFORMIN (GLUCOPHAGE) 1000 MG tablet, Take 1 tablet (1,000 mg total) by mouth 2 (two) times daily with a meal., Disp: 180 tablet, Rfl: 3 .  metoprolol succinate (TOPROL-XL) 50 MG 24 hr tablet, Take 1 tablet (50 mg total) by mouth daily. Take with or immediately following a meal., Disp: 90 tablet, Rfl: 3 .  mirtazapine (REMERON) 30 MG tablet, Take 1 tablet (30 mg total) by mouth at bedtime., Disp: 30 tablet, Rfl: 3 .  potassium chloride 20 MEQ TBCR, Take 20 mEq by mouth 2 (two) times daily., Disp: 60 tablet, Rfl: 0 .  risperidone (RISPERDAL) 4 MG tablet, Take 1 tablet (4 mg total) by mouth daily., Disp: 30 tablet, Rfl: 3 .  rosuvastatin (CRESTOR) 10 MG tablet, Take 1 tablet (10 mg total) by mouth daily., Disp: 90 tablet, Rfl: 3   Past Medical History  Diagnosis Date  . Insulin dependent type 2 diabetes mellitus, uncontrolled ([place])   . Chronic diastolic CHF (congestive heart failure) ([place])   . Morbid obesity with BMI of 50.0-59.9, adult ([place])   . Bipolar disease, chronic ([place])   . History of thyrotoxicosis   . OSA (obstructive sleep apnea) 03/06/2011  . Chest pain     a. 2012 Myoview: EF 63%, no isch/infarct  . Chronic diastolic congestive heart failure ([place]) 07/23/2011  . Hypertensive heart disease 10/18/2013    Allergies  Allergen Reactions  . Acetaminophen     Fits as a child "seizures-like"  . Caffeine     Tense, anxiety, increased urination  . Lisinopril Rash    Rash with lisinopril; but fosinopril is ok per patient   Review of Systems    Constitutional:   No  weight loss, night sweats,  Fevers, chills, fatigue, or   lassitude.  HEENT:   No headaches,  Difficulty swallowing,  Tooth/dental problems, or  Sore throat,                No sneezing, itching, ear ache, nasal congestion, post nasal drip,   CV:  No chest pain,  Orthopnea, PND, swelling in lower extremities, anasarca, dizziness, palpitations, syncope.   GI  No heartburn, indigestion, abdominal pain, nausea, vomiting, diarrhea, change in bowel habits, loss of appetite, bloody stools.   Resp: No shortness of breath with exertion or at rest.  No excess mucus, no productive cough,  No non-productive cough,  No coughing up of blood.  No change in color of mucus.  No wheezing.  No chest wall deformity  Skin: no rash or lesions.  GU: no dysuria, change in color of urine, no urgency or frequency.  No flank pain, no hematuria   MS:  No joint pain or swelling.  No decreased range of motion.  No back pain.  Psych:  No change in mood or affect. No depression or anxiety.  No memory loss.     Objective:   Physical Exam BP 142/88 mmHg  Pulse 83  Ht 5\' 7"  (1.702 m)  Wt  336 lb 9.6 oz (152.681 kg)  BMI 52.71 kg/m2  SpO2 92%  LMP 10/22/2015  Physical Exam:  General- No distress,  A&Ox3, pleasant and well appearing ENT: No sinus tenderness, TM clear, pale nasal mucosa, no oral exudate,no post nasal drip, no LAN Cardiac: S1, S2, regular rate and rhythm, no murmur Chest: No wheeze/ rales/ dullness; no accessory muscle use, no nasal flaring, no sternal retractions Abd.: Soft Non-tender Ext: No clubbing cyanosis, edema Neuro:  normal strength Skin: No rashes, warm and dry Psych: normal mood and behavior  Magdalen Spatz, AGACNP-BC [place] Pager # [phone] 12/13/15     Assessment & Plan:

## 2015-12-13 NOTE — Patient Instructions (Signed)
It is nice to meet you today.  I am glad you are feeling so much better. We will get a chest x ray today to make sure your pneumonia has resolved. I will call you with results today. We will have you follow up with Dr. Lamonte Sakai for further discussion of treatment vs. Close monitoring of the MAC Follow up appointment with Dr. Lamonte Sakai in 6 weeks with CXR. Congratulations on you weight loss. Follow up with [place] if you need [place] sooner.

## 2015-12-13 NOTE — Assessment & Plan Note (Signed)
Pt is asymptomatic clinically. 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 Findings suggest possibility of opportunistic infection. HIV done 11/12/15 was non-reactive.  Plan: Repeat CT scan 6 weeks from discharge ( April 6th) Follow up appointment with Dr. Lamonte Sakai to determine if treatment vs. Close monitoring of clinical status.

## 2015-12-13 NOTE — Assessment & Plan Note (Signed)
Patient is dieting and has lost approximately 18 pounds. She is working very hard on this. Encouraged her to continue to work on weight loss as it will improve her overall health.

## 2015-12-23 LAB — AFB CULTURE WITH SMEAR (NOT AT ARMC): Acid Fast Smear: NONE SEEN

## 2015-12-24 ENCOUNTER — Ambulatory Visit: Payer: Self-pay | Admitting: Dietician

## 2015-12-27 LAB — AFB CULTURE WITH SMEAR (NOT AT ARMC): ACID FAST SMEAR: NONE SEEN

## 2015-12-30 LAB — AFB CULTURE WITH SMEAR (NOT AT ARMC)
ACID FAST SMEAR: NONE SEEN
Acid Fast Smear: NONE SEEN

## 2016-01-27 MED FILL — !VENTOLIN HFA INHALER: 108 (90 BAS | 28 days supply | Qty: 18 | Fill #2

## 2016-01-27 MED FILL — glipiZIDE ER 10 MG TB24: 10 | 30 days supply | Qty: 30 | Fill #1

## 2016-01-27 MED FILL — risperiDONE 2 MG TABS: 2 | 30 days supply | Qty: 60 | Fill #2

## 2016-01-27 MED FILL — ?METFORMIN HCL 1,000 MG TAB: 1000 | 30 days supply | Qty: 60 | Fill #1

## 2016-01-27 MED FILL — FUROSEMIDE 80 MG TABLET: 80 | 30 days supply | Qty: 30 | Fill #1

## 2016-01-27 MED FILL — AMLODIPINE BESYLATE 10 MG T: 10 | 30 days supply | Qty: 30 | Fill #1

## 2016-01-27 MED FILL — LOSARTAN POTASSIUM 100 MG T: 100 | 30 days supply | Qty: 30 | Fill #1

## 2016-01-29 ENCOUNTER — Other Ambulatory Visit: Payer: Self-pay | Admitting: Internal Medicine

## 2016-01-29 ENCOUNTER — Ambulatory Visit (INDEPENDENT_AMBULATORY_CARE_PROVIDER_SITE_OTHER): Payer: Self-pay | Admitting: Emergency Medicine

## 2016-01-29 ENCOUNTER — Encounter: Payer: Self-pay | Admitting: Emergency Medicine

## 2016-01-29 VITALS — BP 126/82 | HR 74 | Ht 66.0 in | Wt 334.0 lb

## 2016-01-29 DIAGNOSIS — G4733 Obstructive sleep apnea (adult) (pediatric): Secondary | ICD-10-CM

## 2016-01-29 DIAGNOSIS — J849 Interstitial pulmonary disease, unspecified: Secondary | ICD-10-CM

## 2016-01-29 MED FILL — POTASSIUM CL 10 MEQ TAB SA: 10 | 30 days supply | Qty: 60 | Fill #0

## 2016-01-29 NOTE — Patient Instructions (Addendum)
Please restart your fluticasone nasal spray, 2 sprays each side daily We will repeat your CT scan of your chest  We will perform a sputum culture to confirm the presence of mycobacterium avium We will order CPAP for you through [facility].  Follow with Dr Lamonte Sakai in 3 months or sooner if you have any problems.

## 2016-01-29 NOTE — Assessment & Plan Note (Signed)
With LAD and some basilar GGI. Certainly could be related to [facility]. Will repeat her sputum cx, if unrevealing then she will likely need FOB. Will repeat her Ct chest to compare with prior.

## 2016-01-29 NOTE — Telephone Encounter (Signed)
Pt. Came into facility requesting a refill on potassium chloride 20 MEQ TBCR. Please f/u with pt.

## 2016-01-29 NOTE — Assessment & Plan Note (Signed)
Not currently on CPAP. She states that she needs a new machine and new orders. Based on PSG from 11/2015 will order CPAP 19cm H2O, follow in 3 months for compliance

## 2016-01-29 NOTE — Progress Notes (Signed)
   Subjective:    Patient ID: [name], female    DOB: 18-Oct-1965, 50 y.o.   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 Patient has significant comorbidities which include untreated sleep apnea, diastolic heart failure. Recent respiratory infection treated with Levaquin in January 2017. Chest CTA scan showed no filling defects, poor inspiratory effort, enlargement of 2R and 7 mediastinal nodes, very subtle base predominant bilateral patchy groundglass like infiltrates, ? airtrapping. She had a chest CT scan in 2012 which looks similar but the lymph nodes were not as prominent. Her quant gold was negative, ACE normal, HIV negative. Her sputum cx from hospitalization > [facility]. She was to have a repeat Ct chest to further eval her infiltrates but this has not happened yet.   She is having daily cough, can wake her from sleep. Prod of green to yellow, she is having allergy sx. She is on an anti-histamine, ? Which one. Not taking her nasal steroid currently. Her breathing is stable - she does have some exertional dyspnea.    She has a CPAP but is not using it reliably. PSG on 3/5 states that she she needs CPAP therapy on 19 cm H2O with a Medium size Resmed Full Face Mask AirFit F20 mask     Review of Systems As per HPI     Objective:   Physical Exam Filed Vitals:   01/29/16 1603 01/29/16 1604  BP:  126/82  Pulse:  74  Height: 5\' 6"  (1.676 m)   Weight: 334 lb (151.501 kg)   SpO2:  98%   Gen: Pleasant, obese woman, in no distress,  normal affect  ENT: No lesions,  mouth clear,  oropharynx clear, no postnasal drip  Neck: No JVD, no TMG, no carotid bruits  Lungs: No use of accessory muscles, small breaths, focal r exp rhonchi,   Cardiovascular: RRR, heart sounds normal, no murmur or gallops, trace peripheral edema  Musculoskeletal: No  deformities, no cyanosis or clubbing  Neuro: alert, non focal  Skin: Warm, no lesions or rashes      Assessment & Plan:  Abnormal CT scan, chest With LAD and some basilar GGI. Certainly could be related to [facility]. Will repeat her sputum cx, if unrevealing then she will likely need FOB. Will repeat her Ct chest to compare with prior.   OSA (obstructive sleep apnea)- non compliant with C-pap Not currently on CPAP. She states that she needs a new machine and new orders. Based on PSG from 11/2015 will order CPAP 19cm H2O, follow in 3 months for compliance

## 2016-01-30 ENCOUNTER — Other Ambulatory Visit: Payer: Self-pay

## 2016-01-30 DIAGNOSIS — J849 Interstitial pulmonary disease, unspecified: Secondary | ICD-10-CM

## 2016-02-03 ENCOUNTER — Ambulatory Visit (INDEPENDENT_AMBULATORY_CARE_PROVIDER_SITE_OTHER)
Admission: RE | Admit: 2016-02-03 | Discharge: 2016-02-03 | Disposition: A | Discharge status: Home / Self Care | Payer: Self-pay | Source: Ambulatory Visit | Attending: Emergency Medicine | Admitting: Emergency Medicine

## 2016-02-03 DIAGNOSIS — J849 Interstitial pulmonary disease, unspecified: Secondary | ICD-10-CM

## 2016-02-18 NOTE — Telephone Encounter (Signed)
Potassium was refilled on 01/29/16

## 2016-03-09 ENCOUNTER — Other Ambulatory Visit: Payer: Self-pay | Admitting: Internal Medicine

## 2016-03-09 MED FILL — POTASSIUM CL 10 MEQ TAB SA: 10 | 30 days supply | Qty: 60 | Fill #1

## 2016-03-09 MED FILL — ?METFORMIN HCL 1,000 MG TAB: 1000 | 30 days supply | Qty: 60 | Fill #2

## 2016-03-09 MED FILL — ?FUROSEMIDE 80MG TABLET: 80 | 30 days supply | Qty: 30 | Fill #0

## 2016-03-09 MED FILL — glipiZIDE ER 10 MG TB24: 10 | 30 days supply | Qty: 30 | Fill #2

## 2016-03-09 MED FILL — LOSARTAN POTASSIUM 100 MG T: 100 | 30 days supply | Qty: 30 | Fill #2

## 2016-03-09 MED FILL — risperiDONE 2 MG TABS: 2 | 30 days supply | Qty: 60 | Fill #3

## 2016-03-09 MED FILL — !VENTOLIN HFA INHALER: 108 (90 BAS | 28 days supply | Qty: 18 | Fill #3

## 2016-03-09 MED FILL — ?AMLODIPINE BESYLATE 10 MG: 10 | 30 days supply | Qty: 30 | Fill #2

## 2016-03-12 ENCOUNTER — Ambulatory Visit: Payer: Self-pay | Attending: Internal Medicine

## 2016-03-17 LAB — AFB CULTURE WITH SMEAR (NOT AT ARMC)

## 2016-04-21 ENCOUNTER — Other Ambulatory Visit: Payer: Self-pay | Admitting: Internal Medicine

## 2016-04-21 DIAGNOSIS — F3162 Bipolar disorder, current episode mixed, moderate: Secondary | ICD-10-CM

## 2016-04-22 NOTE — Telephone Encounter (Signed)
Rx Request 

## 2016-04-23 MED FILL — risperiDONE 2 MG TABS: 2 | 30 days supply | Qty: 60 | Fill #0

## 2016-04-23 MED FILL — ?AMLODIPINE BESYLATE 10 MG: 10 | 30 days supply | Qty: 30 | Fill #0

## 2016-04-28 MED FILL — ?METFORMIN HCL 1,000 MG TAB: 1000 | 30 days supply | Qty: 60 | Fill #3

## 2016-04-28 MED FILL — LOSARTAN POTASSIUM 100 MG T: 100 | 30 days supply | Qty: 30 | Fill #3

## 2016-04-28 MED FILL — glipiZIDE ER 10 MG TB24: 10 | 30 days supply | Qty: 30 | Fill #3

## 2016-04-28 MED FILL — ?FUROSEMIDE 80MG TABLET: 80 | 30 days supply | Qty: 30 | Fill #1

## 2016-05-04 ENCOUNTER — Telehealth: Payer: Self-pay

## 2016-05-04 NOTE — Telephone Encounter (Signed)
lmtcb X1 to ask pt to bring cpap to office visit with RB tomorrow.

## 2016-05-04 NOTE — Telephone Encounter (Signed)
Pt cb and I have informed her per [facility] note to bring cpap with her to the visit tomorrow patient verbalized understanding and nothing further is needed

## 2016-05-05 ENCOUNTER — Ambulatory Visit (INDEPENDENT_AMBULATORY_CARE_PROVIDER_SITE_OTHER): Payer: Self-pay | Admitting: Emergency Medicine

## 2016-05-05 ENCOUNTER — Encounter: Payer: Self-pay | Admitting: Emergency Medicine

## 2016-05-05 VITALS — BP 140/102 | HR 79 | Ht 67.0 in | Wt 326.0 lb

## 2016-05-05 DIAGNOSIS — R938 Abnormal findings on diagnostic imaging of other specified body structures: Secondary | ICD-10-CM

## 2016-05-05 DIAGNOSIS — I208 Other forms of angina pectoris: Secondary | ICD-10-CM

## 2016-05-05 DIAGNOSIS — R9389 Abnormal findings on diagnostic imaging of other specified body structures: Secondary | ICD-10-CM

## 2016-05-05 DIAGNOSIS — G4733 Obstructive sleep apnea (adult) (pediatric): Secondary | ICD-10-CM

## 2016-05-05 DIAGNOSIS — R0789 Other chest pain: Secondary | ICD-10-CM

## 2016-05-05 NOTE — Patient Instructions (Addendum)
We will try again to get your CPAP device checked and your pressure changed to 19cm H2O We need to get you an appointment to be evaluated at [facility] soon to assess your chest pain when exerting yourself.  Follow with Dr Lamonte Sakai in 3 months or sooner if you have any problems.

## 2016-05-05 NOTE — Assessment & Plan Note (Signed)
Stable on current CT scan. I don't believe need to initiate any evaluation or bronchoscopy at this time. I'm more concerned about getting her sleep apnea under control and having her chest pain evaluated. If she does have some degree of pulmonary hypertension then improving her sleep apnea control may actually reverse this as well as some of her lymphadenopathy.

## 2016-05-05 NOTE — Assessment & Plan Note (Signed)
Significantly elevated here today. She tells me that she did not take her medications today

## 2016-05-05 NOTE — Assessment & Plan Note (Signed)
That she describes as midsternal and reliably occurs with exertion. She needs to be reevaluated by cardiology as soon as possible. She tells me today that she was asked to get a left heart catheterization in the past but that she refused this.

## 2016-05-05 NOTE — Assessment & Plan Note (Signed)
She needs to be on 19 cm water pressure. He has not followed with [facility] when contacted regarding getting a new CPAP machine and changing her pressure. She also needs a new mask. We will try to reconnect her with them to make this happen.

## 2016-05-05 NOTE — Progress Notes (Signed)
Subjective:    Patient ID: [name], female    DOB: [birth year], 50 y.o.   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 Patient has significant comorbidities which include untreated sleep apnea, diastolic heart failure. Recent respiratory infection treated with Levaquin in January 2017. Chest CTA scan showed no filling defects, poor inspiratory effort, enlargement of 2R and 7 mediastinal nodes, very subtle base predominant bilateral patchy groundglass like infiltrates, ? airtrapping. She had a chest CT scan in 2012 which looks similar but the lymph nodes were not as prominent. Her quant gold was negative, ACE normal, HIV negative. Her sputum cx from hospitalization > [facility]. She was to have a repeat Ct chest to further eval her infiltrates but this has not happened yet.   She is having daily cough, can wake her from sleep. Prod of green to yellow, she is having allergy sx. She is on an anti-histamine, ? Which one. Not taking her nasal steroid currently. Her breathing is stable - she does have some exertional dyspnea.    She has a CPAP but is not using it reliably. PSG on 3/5 states that she she needs CPAP therapy on 19 cm H2O with a Medium size Resmed Full Face Mask AirFit F20 mask    ROV 05/05/16 -- This is a follow-up visit for patient with obstructive sleep apnea determined to need CPAP 19 cmH2O. Also with a history of Mediastinal lymphadenopathy and some basilar groundglass infiltrate. Sputum culture from 2/17 was positive for Myco bacterium avium.  I performed a repeat CT scan of the chest on 02/03/16 that showed improvement in some diffuse groundglass infiltrate, no evidence of interstitial lung disease or bronchiectasis and some stable mild to moderate bilateral hilar adenopathy. She has a CPAP machine with her today that she says does not work.  She also states that she has difficulty tolerating full face mask. She did hear from [facility], but she not ever go to see them to get new pressure or a new mask. She says that she has some exertional mid-sternal CP. She has been having this since February, 2017.  She has been recommended to have a L heart cath in the past but I believe she refused it.     Review of Systems As per HPI     Objective:   Physical Exam Vitals:   05/05/16 1513  BP: (!) 140/102  Pulse: 79  SpO2: 96%  Weight: (!) 326 lb (147.9 kg)  Height: 5\' 7"  (1.702 m)   Gen: Pleasant, obese woman, in no distress,  normal affect  ENT: No lesions,  mouth clear,  oropharynx clear, no postnasal drip  Neck: No JVD, no TMG, no carotid bruits  Lungs: No use of accessory muscles, small breaths, focal r exp rhonchi,   Cardiovascular: RRR, heart sounds normal, no murmur or gallops, trace peripheral edema  Musculoskeletal: No deformities, no cyanosis or clubbing  Neuro: alert, non focal  Skin: Warm, no lesions or rashes      Assessment & Plan:  Mediastinal adenopathy Stable on current CT scan. I don't believe need to initiate any evaluation or bronchoscopy at this time. I'm more concerned about getting her sleep apnea under control and having her chest pain evaluated. If she does have some degree of pulmonary hypertension then improving her sleep apnea control may actually reverse this as well  as some of her lymphadenopathy.  Essential hypertension, benign Significantly elevated here today. She tells me that she did not take her medications today  OSA (obstructive sleep apnea)- non compliant with C-pap She needs to be on 19 cm water pressure. He has not followed with [facility] when contacted regarding getting a new CPAP machine and changing her pressure. She also needs a new mask. We will try to reconnect her with them to make this happen.  Abnormal CT scan, chest Very subtle groundglass Infiltrate, we sound hilar  lymphadenopathy that is stable  Chest pain That she describes as midsternal and reliably occurs with exertion. She needs to be reevaluated by cardiology as soon as possible. She tells me today that she was asked to get a left heart catheterization in the past but that she refused this.  Baltazar Apo, MD, PhD 05/05/2016, 3:38 PM [facility] [phone] or if no answer [phone]

## 2016-05-05 NOTE — Assessment & Plan Note (Signed)
Very subtle groundglass Infiltrate, we sound hilar lymphadenopathy that is stable

## 2016-05-13 ENCOUNTER — Ambulatory Visit: Payer: Self-pay | Admitting: Cardiovascular Disease

## 2016-05-14 ENCOUNTER — Ambulatory Visit (INDEPENDENT_AMBULATORY_CARE_PROVIDER_SITE_OTHER): Payer: No Typology Code available for payment source | Admitting: Nurse Practitioner

## 2016-05-14 ENCOUNTER — Encounter: Payer: Self-pay | Admitting: Nurse Practitioner

## 2016-05-14 VITALS — BP 160/90 | HR 86 | Ht 67.0 in | Wt 327.0 lb

## 2016-05-14 DIAGNOSIS — I5032 Chronic diastolic (congestive) heart failure: Secondary | ICD-10-CM

## 2016-05-14 DIAGNOSIS — I110 Hypertensive heart disease with heart failure: Secondary | ICD-10-CM

## 2016-05-14 DIAGNOSIS — I200 Unstable angina: Secondary | ICD-10-CM

## 2016-05-14 MED ORDER — CARVEDILOL 3.125 MG PO TABS: 3.1250 mg | ORAL_TABLET | Freq: Two times a day (BID) | ORAL | 11 refills | Status: DC

## 2016-05-14 MED ORDER — NITROGLYCERIN 0.4 MG SL SUBL: 0.4000 mg | SUBLINGUAL_TABLET | SUBLINGUAL | 6 refills | Status: DC | PRN

## 2016-05-14 MED ORDER — ASPIRIN EC 81 MG PO TBEC: 81.0000 mg | DELAYED_RELEASE_TABLET | Freq: Every day | ORAL | Status: DC

## 2016-05-14 MED FILL — CARVEDILOL 3.125 MG TABLET: 3.125 | 30 days supply | Qty: 60 | Fill #0

## 2016-05-14 MED FILL — NITROSTAT 0.4 MG TABLET SL: 0.4 | 25 days supply | Qty: 25 | Fill #0

## 2016-05-14 NOTE — Progress Notes (Signed)
Office Visit    Patient Name: [name] Date of Encounter: 05/14/2016  Primary Care Provider:  [name], MD Primary Cardiologist:  B. [name], MD   Chief Complaint    50 year old female with a history of chest pain, diastolic CHF, hypertensive heart disease with hypertensive urgency, diabetes, sleep apnea, and abnormal chest CT with mediastinal adenopathy followed by pulmonology, who presents for evaluation related to chest pain.  Past Medical History    Past Medical History:  Diagnosis Date  . Bipolar disease, chronic ([place])   . Chest pain    a. 2012 Myoview: EF 63%, no isch/infarct.  . Chronic diastolic CHF (congestive heart failure) ([place])    a. 2015 Echo: EF 55-60%, Gr2 DD;  b. 09/2015 Echo: EF 60-65%, no rwma, mod dil LA, PASP 60mmHg.  Marland Kitchen History of thyrotoxicosis   . Hypertensive heart disease 10/18/2013  . Insulin dependent type 2 diabetes mellitus, uncontrolled ([place])   . Mediastinal adenopathy   . Morbid obesity with BMI of 50.0-59.9, adult ([place])   . OSA (obstructive sleep apnea) 03/06/2011   History reviewed. No pertinent surgical history.  Allergies  Allergies  Allergen Reactions  . Acetaminophen     Fits as a child "seizures-like"  . Caffeine     Tense, anxiety, increased urination  . Lisinopril Rash    Rash with lisinopril; but fosinopril is ok per patient    History of Present Illness    50 year old female with the above, his past medical history.  She is a history of chest pain dating back several years with nonischemic Myoview in 2012. Other risk factors for coronary disease include hypertension, diabetes, diastolic heart failure. 2014, showed recurrent chest pain with recommendation for diagnostic catheterization, however she declined. She has since been medically managed. More recently, she has required admissions earlier this year related to hypertensive heart disease and diastolic failure requiring intravenous diuresis. She was recently  evaluated by pulmonology secondary to history of abnormal chest CT with mediastinal adenopathy, basilar groundglass infiltrate, and prior Mycobacterium avium infection. During that visit, she reported intermittent chest discomfort and cardiology follow-up was arranged. She says that over the past several months, dating back to her admission in February, that she has been having exertional substernal chest discomfort and dyspnea on exertion. Symptoms occur with fairly low levels of activity and are also associated with fatigue. Symptoms last about 1-2 minutes and resolve with rest. Symptoms are similar to what she's been having dating back to at least 2012, but seem to come on a little more easily. She denies PND, orthopnea, dizziness, sig, edema, or early satiety. She does not routinely check her blood pressure at home.   Home Medications    Prior to Admission medications   Medication Sig Start Date End Date Taking? Authorizing Provider  albuterol (PROVENTIL HFA;VENTOLIN HFA) 108 (90 Base) MCG/ACT inhaler Inhale 1-2 puffs into the lungs every 6 (six) hours as needed for wheezing or shortness of breath. 11/04/15   Shary Decamp, PA-C  amLODipine (NORVASC) 10 MG tablet TAKE 1 TABLET BY MOUTH DAILY. 04/23/16   Tresa Garter, MD  furosemide (LASIX) 40 MG tablet Take 1 tablet (40 mg total) by mouth 2 (two) times daily. 11/21/15   Domenic Polite, MD  furosemide (LASIX) 80 MG tablet TAKE 1 TABLET BY MOUTH DAILY. 03/09/16   Tresa Garter, MD  glipiZIDE (GLUCOTROL XL) 10 MG 24 hr tablet Take 1 tablet (10 mg total) by mouth daily with breakfast. 11/28/15   Olugbemiga  Essie Christine, MD  LORazepam (ATIVAN) 1 MG tablet Take 1 tablet (1 mg total) by mouth at bedtime. 07/02/14   Tresa Garter, MD  losartan (COZAAR) 100 MG tablet TAKE 1 TABLET BY MOUTH DAILY. 12/10/15   Tresa Garter, MD  metFORMIN (GLUCOPHAGE) 1000 MG tablet Take 1 tablet (1,000 mg total) by mouth 2 (two) times daily with a meal. 11/28/15    Olugbemiga E Doreene Burke, MD  potassium chloride (K-DUR) 10 MEQ tablet TAKE 1 TABLET BY MOUTH 2 TIMES DAILY. 04/23/16   Tresa Garter, MD  risperiDONE (RISPERDAL) 2 MG tablet TAKE TWO TABLETS BY MOUTH DAILY 04/23/16   Tresa Garter, MD    Review of Systems    As above, she's been having exertional dyspnea and substernal chest discomfort. This is similar to symptoms she has been having for many years but slightly worse and occurring with less provocation.  She denies PND, orthopnea, dizziness or syncope, edema, palpitations, or early satiety. All other systems reviewed and are otherwise negative except as noted above.  Physical Exam    VS:  BP (!) 160/90   Pulse 86   Ht 5\' 7"  (1.702 m)   Wt (!) 327 lb (148.3 kg)   SpO2 94%   BMI 51.22 kg/m  , BMI Body mass index is 51.22 kg/m. GEN: Morbidly obese, no acute distress.  HEENT: normal.  Neck: Supple, obese, difficult to gauge JVP, no carotid bruits, or masses. Cardiac: RRR, no murmurs, rubs, or gallops. No clubbing, cyanosis, edema.  Radials/DP/PT 2+ and equal bilaterally.  Respiratory:  Respirations regular and unlabored, clear to auscultation bilaterally. GI: Soft, nontender, nondistended, BS + x 4. MS: no deformity or atrophy. Skin: warm and dry, no rash. Neuro:  Strength and sensation are intact. Psych: Normal affect.  Accessory Clinical Findings    ECG - RSR, 86, biatrial enlargtement, prior antsept infarct - no acute st/t changes.  Assessment & Plan    1.  Unstable angina: Patient has a long history of exertional chest discomfort associated with dyspnea with prior Myoview in 2012, which was nonischemic. In 2014, it was recommended that she undergo diagnostic catheterization, however she refused. She remains disinterested in the idea of catheterization at this time as well. I will arrange for a lexiscan Myoview to assess for ischemia. Second or 2 her size, this will be a 2 day study. In the setting of hypertension, I will  also add beta blocker therapy and provide her with a prescription for sublingual nitroglycerin. I will plan to see her back in clinic following her Myoview in a couple of weeks.  2. Hypertensive heart disease/chronic diastolic congestive heart failure: Patient has had prior admissions this year with hypertensive urgency and volume overload. Today, her blood pressure is elevated at 160/90, though her volume looks good. She does not check her blood pressure at home. I suspect that elevated blood pressures with activity, may be contributing to her symptoms as outlined above. She is on amlodipine 10 mg, Lasix 80 mg, losartan 100 mg. I will add carvedilol 3.125 mg twice a day. She had previously been taking metoprolol, but says that she is no longer on it but doesn't know why.  3. Morbid obesity: She is not particularly active nor does she watch her diet. Pending stress testing, perhaps we can make a recommendation for regular activity. Next  4. Type 2 diabetes mellitus: This 95 primary care she's currently taking glipizide.  5. Disposition: Follow-up stress testing as above. I can  see her back in clinic in a few weeks after stress testing.   Murray Hodgkins, NP 05/14/2016, 2:56 PM

## 2016-05-14 NOTE — Patient Instructions (Addendum)
Medication Instructions:  START Carvedilol (Coreg) 3.125 mg twice daily - take 12 hours apart START Aspirin 81 mg once daily TAKE Nitroglycerin as needed for chest pain   Labwork: None Ordered   Testing/Procedures: Your physician has requested that you have a lexiscan myoview. For further information please visit [email or web address]. Please follow instruction sheet, as given.   Follow-Up: Your physician recommends that you schedule a follow-up appointment in: 4 weeks with Dr. [name] or Ignacia Bayley, NP   If you need a refill on your cardiac medications before your next appointment, please call your pharmacy.   Thank you for choosing [facility]! Christen Bame, RN [phone]

## 2016-05-19 ENCOUNTER — Telehealth (HOSPITAL_COMMUNITY): Payer: Self-pay | Admitting: *Deleted

## 2016-05-19 MED FILL — VENTOLIN HFA 90 MCG INHALER: 108 (90 BAS | 28 days supply | Qty: 18 | Fill #4

## 2016-05-19 NOTE — Telephone Encounter (Signed)
Left message on voicemail in reference to upcoming appointment scheduled for 05/20/16. Phone number given for a call back so details instructions can be given. Annette Liotta, Ranae [place]

## 2016-05-20 ENCOUNTER — Ambulatory Visit (HOSPITAL_COMMUNITY): Payer: No Typology Code available for payment source | Attending: Cardiology

## 2016-05-20 DIAGNOSIS — I110 Hypertensive heart disease with heart failure: Secondary | ICD-10-CM | POA: Insufficient documentation

## 2016-05-20 DIAGNOSIS — R0609 Other forms of dyspnea: Secondary | ICD-10-CM | POA: Insufficient documentation

## 2016-05-20 DIAGNOSIS — I200 Unstable angina: Secondary | ICD-10-CM | POA: Insufficient documentation

## 2016-05-20 MED ORDER — TECHNETIUM TC 99M TETROFOSMIN IV KIT
33.0000 | PACK | Freq: Once | INTRAVENOUS | Status: AC | PRN
  Administered 2016-05-20: 33 via INTRAVENOUS
  Filled 2016-05-20: qty 33

## 2016-05-20 MED ORDER — REGADENOSON 0.4 MG/5ML IV SOLN
0.4000 mg | Freq: Once | INTRAVENOUS | Status: AC
  Administered 2016-05-20: 0.4 mg via INTRAVENOUS

## 2016-05-21 ENCOUNTER — Ambulatory Visit (HOSPITAL_COMMUNITY): Payer: No Typology Code available for payment source | Attending: Cardiology

## 2016-05-21 LAB — MYOCARDIAL PERFUSION IMAGING
CHL CUP NUCLEAR SDS: 0
CHL CUP NUCLEAR SSS: 0
LHR: 0.41
LV dias vol: 123 mL (ref 46–106)
LV sys vol: 34 mL
NUC STRESS TID: 1.08
Peak HR: 111 {beats}/min
Rest HR: 80 {beats}/min
SRS: 0

## 2016-05-21 MED ORDER — TECHNETIUM TC 99M TETROFOSMIN IV KIT
31.2000 | PACK | Freq: Once | INTRAVENOUS | Status: AC | PRN
  Administered 2016-05-21: 31.2 via INTRAVENOUS
  Filled 2016-05-21: qty 31

## 2016-06-04 ENCOUNTER — Encounter: Payer: Self-pay | Admitting: *Deleted

## 2016-06-04 ENCOUNTER — Telehealth: Payer: Self-pay | Admitting: Cardiovascular Disease

## 2016-06-04 NOTE — Telephone Encounter (Signed)
New message      Pt returning nurse call about test results. Please call.

## 2016-06-04 NOTE — Telephone Encounter (Signed)
Returned pts call, she has been made aware of her stress test results.  She has been made aware that a letter has been mailed to her since we were having difficulty contacting her, and once she gets it, she doesn't need to call since she has been made aware.  Pt verbalized understanding.

## 2016-06-16 ENCOUNTER — Ambulatory Visit (INDEPENDENT_AMBULATORY_CARE_PROVIDER_SITE_OTHER): Payer: No Typology Code available for payment source | Admitting: Nurse Practitioner

## 2016-06-16 ENCOUNTER — Encounter: Payer: Self-pay | Admitting: Nurse Practitioner

## 2016-06-16 VITALS — BP 154/90 | HR 91 | Ht 67.0 in | Wt 326.2 lb

## 2016-06-16 DIAGNOSIS — I10 Essential (primary) hypertension: Secondary | ICD-10-CM

## 2016-06-16 DIAGNOSIS — R079 Chest pain, unspecified: Secondary | ICD-10-CM

## 2016-06-16 MED ORDER — PANTOPRAZOLE SODIUM 40 MG PO TBEC: 40.0000 mg | DELAYED_RELEASE_TABLET | Freq: Every day | ORAL | 6 refills | Status: DC

## 2016-06-16 MED ORDER — CARVEDILOL 3.125 MG PO TABS: 3.1250 mg | ORAL_TABLET | Freq: Two times a day (BID) | ORAL | 6 refills | Status: DC

## 2016-06-16 NOTE — Progress Notes (Signed)
Office Visit    Patient Name: [name] Date of Encounter: 06/16/2016  Primary Care Provider:  [name], MD Primary Cardiologist:  B. [name], MD   Chief Complaint    50 year old female with a history of chest pain, diastolic CHF, hypertensive heart disease with hypertensive urgency, diabetes, sleep apnea, and abnormal chest CT with mediastinal adenopathy followed by pulmonology, who presents for f/u related to c/p and recent nl stress test.  Past Medical History    Past Medical History:  Diagnosis Date  . Bipolar disease, chronic ([place])   . Chest pain    a. 2012 Myoview: EF 63%, no isch/infarct;  b. 04/2016 Lexiscan MV: EF 73%, no ischemia/infarct-->Low risk.  . Chronic diastolic CHF (congestive heart failure) ([place])    a. 2015 Echo: EF 55-60%, Gr2 DD;  b. 09/2015 Echo: EF 60-65%, no rwma, mod dil LA, PASP 74mmHg.  Marland Kitchen History of thyrotoxicosis   . Hypertensive heart disease 10/18/2013  . Insulin dependent type 2 diabetes mellitus, uncontrolled ([place])   . Mediastinal adenopathy   . Morbid obesity with BMI of 50.0-59.9, adult ([place])   . OSA (obstructive sleep apnea) 03/06/2011   No past surgical history on file.  Allergies  Allergies  Allergen Reactions  . Acetaminophen     Fits as a child "seizures-like"  . Caffeine     Tense, anxiety, increased urination  . Lisinopril Rash    Rash with lisinopril; but fosinopril is ok per patient    History of Present Illness    50 year old female with the above, his past medical history.  She is a history of chest pain dating back several years with nonischemic Myoview in 2012. Other risk factors for coronary disease include hypertension, diabetes, diastolic heart failure. 2014, showed recurrent chest pain with recommendation for diagnostic catheterization, however she declined. She has since been medically managed. More recently, she has required admissions earlier this year related to hypertensive heart disease and diastolic  failure requiring intravenous diuresis. She was recently evaluated by pulmonology in August secondary to history of abnormal chest CT with mediastinal adenopathy, basilar groundglass infiltrate, and prior Mycobacterium avium infection. During that visit, she reported intermittent chest discomfort and I saw her on 8/17.  She described frequent episodes of exertional sscp and DOE.  We discussed options for eval.  She declined cath but was willing to proceed w/ MV, which was performed in late August and was normal.  Since then, she has continued to have intermittent exertional upper chest and throat discomfort, occurring a few days/wk, lasting a few mins, and resolving with rest.  She has had similar Ss after eating a large meal.  On her last visit, I rx coreg 3.125 mg BID for elevated BP's, however she tells me today that she never started taking this.  Today, she says that she hasn't been feeling well, having mild upper chest discomfort and choking sensation, pretty much all day so far.  This has not limited her activity any.  She denies palpitations, pnd, orthopnea, n, v, dizziness, syncope, edema, weight gain, or early satiety.   Home Medications    Prior to Admission medications   Medication Sig Start Date End Date Taking? Authorizing Provider  albuterol (PROVENTIL HFA;VENTOLIN HFA) 108 (90 Base) MCG/ACT inhaler Inhale 1-2 puffs into the lungs every 6 (six) hours as needed for wheezing or shortness of breath. 11/04/15  Yes Shary Decamp, PA-C  amLODipine (NORVASC) 10 MG tablet TAKE 1 TABLET BY MOUTH DAILY. 04/23/16  Yes Olugbemiga  Essie Christine, MD  aspirin EC 81 MG tablet Take 1 tablet (81 mg total) by mouth daily. 05/14/16  Yes Rogelia Mire, NP  furosemide (LASIX) 80 MG tablet TAKE 1 TABLET BY MOUTH DAILY. 03/09/16  Yes Tresa Garter, MD  glipiZIDE (GLUCOTROL XL) 10 MG 24 hr tablet Take 1 tablet (10 mg total) by mouth daily with breakfast. 11/28/15  Yes Olugbemiga E Doreene Burke, MD  LORazepam (ATIVAN) 1 MG  tablet Take 1 tablet (1 mg total) by mouth at bedtime. 07/02/14  Yes Olugbemiga Essie Christine, MD  losartan (COZAAR) 100 MG tablet TAKE 1 TABLET BY MOUTH DAILY. 12/10/15  Yes Tresa Garter, MD  metFORMIN (GLUCOPHAGE) 1000 MG tablet Take 1 tablet (1,000 mg total) by mouth 2 (two) times daily with a meal. 11/28/15  Yes Tresa Garter, MD  nitroGLYCERIN (NITROSTAT) 0.4 MG SL tablet Place 1 tablet (0.4 mg total) under the tongue every 5 (five) minutes as needed for chest pain. Do not take more than 3 pills in one episode 05/14/16 08/12/16 Yes Rogelia Mire, NP  potassium chloride (K-DUR) 10 MEQ tablet TAKE 1 TABLET BY MOUTH 2 TIMES DAILY. 04/23/16  Yes Tresa Garter, MD  risperiDONE (RISPERDAL) 2 MG tablet TAKE TWO TABLETS BY MOUTH DAILY 04/23/16  Yes Tresa Garter, MD    Review of Systems    As above, she has continued to have some degree of intermittent exertional chest discomfort and DOE along with a similar chest discomfort and choking sensation following large meals.  All other systems reviewed and are otherwise negative except as noted above.  Physical Exam    VS:  BP (!) 154/90   Pulse 91   Ht 5\' 7"  (1.702 m)   Wt (!) 326 lb 3.2 oz (148 kg)   BMI 51.09 kg/m  , BMI Body mass index is 51.09 kg/m. GEN: Well nourished, well developed, in no acute distress.  HEENT: normal.  Neck: Supple, no JVD, carotid bruits, or masses. Cardiac: RRR, 2/6 syst murmur RUSB, no rubs, or gallops. No clubbing, cyanosis, edema.  Radials/DP/PT 2+ and equal bilaterally.  Respiratory:  Respirations regular and unlabored, clear to auscultation bilaterally. GI: Soft, nontender, nondistended, BS + x 4. MS: no deformity or atrophy. Skin: warm and dry, no rash. Neuro:  Strength and sensation are intact. Psych: Normal affect.  Accessory Clinical Findings    ECG - RSR,  81, biatrial enlargement, no acute st/t changes.  Assessment & Plan    1.  Chest Pain:  Pt has a long h/o exertional chest  discomfort associated with dyspnea with neg MV in 2012 and now 04/2016.  She continues to have the same Ss and also notes that Ss worsen with large meals.  She has previously and continues to refuse cath though I am reassured by recent neg MV and absence of coronary calcification on high res CT of the chest in May and CTA of the chest in Feb of this year.  She reported c/p today.  ECG is normal and c/p has resolved with reassurance of nl ECG.  I have recommended that she begin taking the coreg as I previously prescribed as elevated BPs with activity may result in microvascular angina, and she agreed to start doing so.  I have also recommended that she start an taking an antacid as some of her Ss sound gerd-like.  She is willing to do so, and I have sent in a Rx for protonix 40 mg daily.  I rec  that if Ss do not improve, she should f/u with primary care as she may require further eval/GI referral.  2.  Essential HTN:  BP elevated today.  It was elevated last month and I Rx coreg 3.125 mg BID, but she never started this.  I have again stressed the importance of medication compliance and she says that she will start coreg.  Cont losartan and amlodipine.  3.  Morbid Obesity:  She would greatly benefit from increasing her acitivty.  She is very sedentary and does not have any plans to begin exercising.  4.  Type II DM:  On glipizide and metformin.  Followed by PCP.  5.  Dispo:  F/U in 6 mos or sooner if necessary.   Murray Hodgkins, NP 06/16/2016, 3:15 PM

## 2016-06-16 NOTE — Patient Instructions (Addendum)
Medications:  START Protonix (Pantoprazole) 40 mg--1 tab daily  START Carvedilol 3.125mg --1 tab twice daily.   Follow- Up:  Your physician wants you to follow-up in: 6 months with Dr. [name]. You will receive a reminder letter in the mail two months in advance. If you don't receive a letter, please call our office to schedule the follow-up appointment.  If you need a refill on your cardiac medications before your next appointment, please call your pharmacy.

## 2016-06-30 ENCOUNTER — Other Ambulatory Visit: Payer: Self-pay | Admitting: Internal Medicine

## 2016-06-30 DIAGNOSIS — F3162 Bipolar disorder, current episode mixed, moderate: Secondary | ICD-10-CM

## 2016-06-30 MED FILL — PANTOPRAZOLE SOD DR 40 MG T: 40 | 30 days supply | Qty: 30 | Fill #0

## 2016-06-30 MED FILL — NITROSTAT 0.4 MG TABLET SL: 0.4 | 25 days supply | Qty: 25 | Fill #1

## 2016-06-30 MED FILL — metFORMIN HCL 1000 MG TABS: 1000 | 30 days supply | Qty: 60 | Fill #4

## 2016-06-30 MED FILL — VENTOLIN HFA 90 MCG INHALER: 108 (90 BAS | 28 days supply | Qty: 18 | Fill #5

## 2016-06-30 MED FILL — glipiZIDE ER 10 MG TB24: 10 | 30 days supply | Qty: 30 | Fill #4

## 2016-06-30 MED FILL — AMLODIPINE BESYLATE 10 MG T: 10 | 30 days supply | Qty: 30 | Fill #1

## 2016-06-30 MED FILL — ?LOSARTAN POTASSIUM 100 MG: 100 | 30 days supply | Qty: 30 | Fill #4

## 2016-06-30 MED FILL — ?CARVEDILOL 3.125 MG TABLET: 3.125 | 30 days supply | Qty: 60 | Fill #0

## 2016-07-01 ENCOUNTER — Other Ambulatory Visit: Payer: Self-pay | Admitting: Internal Medicine

## 2016-07-01 DIAGNOSIS — F3162 Bipolar disorder, current episode mixed, moderate: Secondary | ICD-10-CM

## 2016-07-01 MED FILL — ?FUROSEMIDE 80MG TABLET: 80 | 30 days supply | Qty: 30 | Fill #0

## 2016-08-05 ENCOUNTER — Ambulatory Visit (INDEPENDENT_AMBULATORY_CARE_PROVIDER_SITE_OTHER): Payer: No Typology Code available for payment source | Admitting: Emergency Medicine

## 2016-08-05 ENCOUNTER — Encounter: Payer: Self-pay | Admitting: Emergency Medicine

## 2016-08-05 DIAGNOSIS — G4733 Obstructive sleep apnea (adult) (pediatric): Secondary | ICD-10-CM

## 2016-08-05 DIAGNOSIS — A310 Pulmonary mycobacterial infection: Secondary | ICD-10-CM

## 2016-08-05 DIAGNOSIS — I272 Pulmonary hypertension, unspecified: Secondary | ICD-10-CM

## 2016-08-05 NOTE — Assessment & Plan Note (Signed)
Due to systemic HTN and also to untreated OSA. Will work to manage both, recheck TTE in the future to assess for change

## 2016-08-05 NOTE — Patient Instructions (Addendum)
We will order CPAP for you to use at night.  We will not repeat your chest imaging at this time. Follow with Dr Lamonte Sakai in 4 months or sooner if you have any problems.

## 2016-08-05 NOTE — Assessment & Plan Note (Signed)
No evidence MAIC clinically or by imaging. The GGI that has been seen on her Ct chest in the past was in setting severe HTN. I will not repeat chest imaging until her HTN is better controlled or if she develops new resp sx. Most important issue at this time is to get her OSA treated.

## 2016-08-05 NOTE — Progress Notes (Signed)
Subjective:    Patient ID: [name], female    DOB: Jan 10, 1966, 50 y.o.   MRN: [id number]  Shortness of Breath  Associated symptoms include rhinorrhea.   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 Patient has significant comorbidities which include untreated sleep apnea, diastolic heart failure. Recent respiratory infection treated with Levaquin in January 2017. Chest CTA scan showed no filling defects, poor inspiratory effort, enlargement of 2R and 7 mediastinal nodes, very subtle base predominant bilateral patchy groundglass like infiltrates, ? airtrapping. She had a chest CT scan in 2012 which looks similar but the lymph nodes were not as prominent. Her quant gold was negative, ACE normal, HIV negative. Her sputum cx from hospitalization > MAIC. She was to have a repeat Ct chest to further eval her infiltrates but this has not happened yet.   She is having daily cough, can wake her from sleep. Prod of green to yellow, she is having allergy sx. She is on an anti-histamine, ? Which one. Not taking her nasal steroid currently. Her breathing is stable - she does have some exertional dyspnea.    She has a CPAP but is not using it reliably. PSG on 3/5 states that she she needs CPAP therapy on 19 cm H2O with a Medium size Resmed Full Face Mask AirFit F20 mask    ROV 05/05/16 -- This is a follow-up visit for patient with obstructive sleep apnea determined to need CPAP 19 cmH2O. Also with a history of Mediastinal lymphadenopathy and some basilar groundglass infiltrate. Sputum culture from 2/17 was positive for Myco bacterium avium.  I performed a repeat CT scan of the chest on 02/03/16 that showed improvement in some diffuse groundglass infiltrate, no evidence of interstitial lung disease or bronchiectasis and some stable mild to moderate bilateral hilar adenopathy. She has  a CPAP machine with her today that she says does not work. She also states that she has difficulty tolerating full face mask. She did hear from [facility], but she not ever go to see them to get new pressure or a new mask. She says that she has some exertional mid-sternal CP. She has been having this since February, 2017.  She has been recommended to have a L heart cath in the past but I believe she refused it.   ROV 08/05/16 -- this is a follow-up visit for obstructive sleep apnea, mediastinal lymphadenopathy on CT scan of the chest, hypertension, and a prior asymptomatic culture positive for [facility] 11/13/15. She has seen cardiology for blood pressure control and also had a myocardial perfusion stress test on 05/21/16 that showed no evidence to support coronary disease. Today her BP is 160/102. It was recommended that she start CPAP at 19 cm water - she says that she was never contacted to get this. She denies any cough or SOB. Her CP has gradually improved. She is able to walk every day.   Needs CPAP 19 cm H2O with a Medium size Resmed Full Face Mask AirFit F20 mask and heated humidification    Review of Systems  HENT: Positive for postnasal drip and rhinorrhea.   Respiratory: Negative for cough, chest tightness and shortness of breath.   Psychiatric/Behavioral: Positive for sleep disturbance.   As per HPI     Objective:   Physical Exam Vitals:   08/05/16 1536  BP: (!) 160/102  Pulse: 87  SpO2:  99%  Weight: (!) 335 lb (152 kg)  Height: 5\' 7"  (1.702 m)    Gen: Pleasant, obese woman, in no distress,  normal affect  ENT: No lesions,  mouth clear,  oropharynx clear, no postnasal drip  Neck: No JVD, no TMG, no carotid bruits  Lungs: No use of accessory muscles, small breaths, no crackles or rhonchi  Cardiovascular: RRR, heart sounds normal, no murmur or gallops, trace peripheral edema  Musculoskeletal: No deformities, no cyanosis or clubbing  Neuro: alert, non focal  Skin: Warm, no lesions  or rashes      Assessment & Plan:  OSA (obstructive sleep apnea)- non compliant with C-pap Not currently on CPAP. Most recent split night study shows she needs 19 cm H2O. Will reorder this  Pulmonary HTN, moderate to severe Due to systemic HTN and also to untreated OSA. Will work to manage both, recheck TTE in the future to assess for change  Mycobacterium avium complex ([place]) No evidence MAIC clinically or by imaging. The GGI that has been seen on her Ct chest in the past was in setting severe HTN. I will not repeat chest imaging until her HTN is better controlled or if she develops new resp sx. Most important issue at this time is to get her OSA treated.   Baltazar Apo, MD, PhD 08/05/2016, 4:09 PM [facility] [phone] or if no answer [phone]

## 2016-08-05 NOTE — Assessment & Plan Note (Signed)
Not currently on CPAP. Most recent split night study shows she needs 19 cm H2O. Will reorder this

## 2016-08-06 ENCOUNTER — Encounter: Payer: Self-pay | Admitting: Internal Medicine

## 2016-08-06 ENCOUNTER — Ambulatory Visit: Payer: No Typology Code available for payment source | Attending: Internal Medicine | Admitting: Internal Medicine

## 2016-08-06 VITALS — BP 154/82 | HR 78 | Temp 98.1°F | Resp 20 | Ht 67.0 in | Wt 337.6 lb

## 2016-08-06 DIAGNOSIS — N393 Stress incontinence (female) (male): Secondary | ICD-10-CM

## 2016-08-06 DIAGNOSIS — Z794 Long term (current) use of insulin: Secondary | ICD-10-CM

## 2016-08-06 DIAGNOSIS — Z7984 Long term (current) use of oral hypoglycemic drugs: Secondary | ICD-10-CM | POA: Insufficient documentation

## 2016-08-06 DIAGNOSIS — E119 Type 2 diabetes mellitus without complications: Secondary | ICD-10-CM

## 2016-08-06 DIAGNOSIS — Z7982 Long term (current) use of aspirin: Secondary | ICD-10-CM | POA: Insufficient documentation

## 2016-08-06 DIAGNOSIS — F3162 Bipolar disorder, current episode mixed, moderate: Secondary | ICD-10-CM

## 2016-08-06 DIAGNOSIS — I5031 Acute diastolic (congestive) heart failure: Secondary | ICD-10-CM

## 2016-08-06 DIAGNOSIS — I110 Hypertensive heart disease with heart failure: Secondary | ICD-10-CM | POA: Insufficient documentation

## 2016-08-06 DIAGNOSIS — G4733 Obstructive sleep apnea (adult) (pediatric): Secondary | ICD-10-CM | POA: Insufficient documentation

## 2016-08-06 DIAGNOSIS — K219 Gastro-esophageal reflux disease without esophagitis: Secondary | ICD-10-CM

## 2016-08-06 DIAGNOSIS — F3160 Bipolar disorder, current episode mixed, unspecified: Secondary | ICD-10-CM | POA: Insufficient documentation

## 2016-08-06 DIAGNOSIS — I10 Essential (primary) hypertension: Secondary | ICD-10-CM

## 2016-08-06 LAB — CBC WITH DIFFERENTIAL/PLATELET
Basophils Absolute: 0 cells/uL (ref 0–200)
Basophils Relative: 0 %
EOS PCT: 1 %
Eosinophils Absolute: 60 cells/uL (ref 15–500)
HCT: 32.7 % — ABNORMAL LOW (ref 35.0–45.0)
HEMOGLOBIN: 9.7 g/dL — AB (ref 11.7–15.5)
LYMPHS ABS: 1800 {cells}/uL (ref 850–3900)
Lymphocytes Relative: 30 %
MCH: 22.6 pg — ABNORMAL LOW (ref 27.0–33.0)
MCHC: 29.7 g/dL — AB (ref 32.0–36.0)
MCV: 76 fL — ABNORMAL LOW (ref 80.0–100.0)
MPV: 10.5 fL (ref 7.5–12.5)
Monocytes Absolute: 420 cells/uL (ref 200–950)
Monocytes Relative: 7 %
NEUTROS ABS: 3720 {cells}/uL (ref 1500–7800)
NEUTROS PCT: 62 %
Platelets: 268 10*3/uL (ref 140–400)
RBC: 4.3 MIL/uL (ref 3.80–5.10)
RDW: 16.5 % — ABNORMAL HIGH (ref 11.0–15.0)
WBC: 6 10*3/uL (ref 3.8–10.8)

## 2016-08-06 LAB — COMPLETE METABOLIC PANEL WITH GFR
ALBUMIN: 3.8 g/dL (ref 3.6–5.1)
ALK PHOS: 100 U/L (ref 33–115)
ALT: 6 U/L (ref 6–29)
AST: 14 U/L (ref 10–35)
BUN: 9 mg/dL (ref 7–25)
CO2: 25 mmol/L (ref 20–31)
Calcium: 8.9 mg/dL (ref 8.6–10.2)
Chloride: 101 mmol/L (ref 98–110)
Creat: 0.81 mg/dL (ref 0.50–1.10)
GFR, Est African American: >89 mL/min (ref >=60)
GFR, Est Non African American: 86 mL/min (ref >=60)
GLUCOSE: 238 mg/dL — AB (ref 65–99)
POTASSIUM: 4.3 mmol/L (ref 3.5–5.3)
SODIUM: 135 mmol/L (ref 135–146)
Total Bilirubin: 0.3 mg/dL (ref 0.2–1.2)
Total Protein: 7 g/dL (ref 6.1–8.1)

## 2016-08-06 LAB — LIPID PANEL
CHOL/HDL RATIO: 3.8 ratio (ref <5.0)
Cholesterol: 173 mg/dL (ref <200)
HDL: 45 mg/dL — ABNORMAL LOW (ref >50)
LDL Cholesterol: 86 mg/dL
Triglycerides: 212 mg/dL — ABNORMAL HIGH (ref <150)
VLDL: 42 mg/dL — AB (ref <30)

## 2016-08-06 LAB — GLUCOSE, POCT (MANUAL RESULT ENTRY): POC Glucose: 257 mg/dl — AB (ref 70–99)

## 2016-08-06 LAB — POCT GLYCOSYLATED HEMOGLOBIN (HGB A1C): Hemoglobin A1C: 9.9

## 2016-08-06 MED ORDER — LOSARTAN POTASSIUM 100 MG PO TABS: 100.0000 mg | ORAL_TABLET | Freq: Every day | ORAL | 3 refills | Status: DC

## 2016-08-06 MED ORDER — GLIPIZIDE ER 10 MG PO TB24: 10.0000 mg | ORAL_TABLET | Freq: Every day | ORAL | 3 refills | Status: DC

## 2016-08-06 MED ORDER — PANTOPRAZOLE SODIUM 40 MG PO TBEC: 40.0000 mg | DELAYED_RELEASE_TABLET | Freq: Every day | ORAL | 3 refills | Status: DC

## 2016-08-06 MED ORDER — OXYBUTYNIN CHLORIDE ER 15 MG PO TB24: 15.0000 mg | ORAL_TABLET | Freq: Every day | ORAL | 3 refills | Status: DC

## 2016-08-06 MED ORDER — AMLODIPINE BESYLATE 10 MG PO TABS: 10.0000 mg | ORAL_TABLET | Freq: Every day | ORAL | 3 refills | Status: DC

## 2016-08-06 MED ORDER — FUROSEMIDE 80 MG PO TABS: 80.0000 mg | ORAL_TABLET | Freq: Every day | ORAL | 3 refills | Status: DC

## 2016-08-06 MED ORDER — POTASSIUM CHLORIDE ER 10 MEQ PO TBCR: 10.0000 meq | EXTENDED_RELEASE_TABLET | Freq: Two times a day (BID) | ORAL | 3 refills | Status: DC

## 2016-08-06 MED ORDER — METFORMIN HCL 1000 MG PO TABS: 1000.0000 mg | ORAL_TABLET | Freq: Two times a day (BID) | ORAL | 3 refills | Status: DC

## 2016-08-06 MED ORDER — CARVEDILOL 3.125 MG PO TABS: 3.1250 mg | ORAL_TABLET | Freq: Two times a day (BID) | ORAL | 3 refills | Status: DC

## 2016-08-06 MED ORDER — RISPERIDONE 2 MG PO TABS: 4.0000 mg | ORAL_TABLET | Freq: Every day | ORAL | 3 refills | Status: DC

## 2016-08-06 NOTE — Progress Notes (Signed)
[name], is a 50 y.o. female  [id number]  [id number]  DOB - November 29, 1965  Chief Complaint  Patient presents with  . Diabetes      Subjective:   [name] is a 50 y.o. female with history of type 2 diabetes mellitus uncontrolled, chronic diastolic heart failure, morbid obesity, bipolar disorder, obstructive sleep apnea and hypertension here today for a follow up visit and medication refills. Patient is in good spirit today, started Librarian, academic course at [employer] She said she has had occasional urinary incontinence for over 2 years. She said her mother told her this may be a symptom of stress and/or depression. She is also having difficulty controlling her weight even though she has being to nutritional class 2x in the past. Patient has No headache, No chest pain, No abdominal pain - No Nausea, No new weakness tingling or numbness, No Cough - SOB. Her medications are listed below. She follows up regularly with Pulmonologist and Cardiologist. Patient was recently treated for [facility]. Patient claimed adherence with medications but not exercise or diet. She needs refill on all her medications today.  No problems updated.  ALLERGIES: Allergies  Allergen Reactions  . Acetaminophen     Fits as a child "seizures-like"  . Caffeine     Tense, anxiety, increased urination  . Lisinopril Rash    Rash with lisinopril; but fosinopril is ok per patient    PAST MEDICAL HISTORY: Past Medical History:  Diagnosis Date  . Bipolar disease, chronic ([place])   . Chest pain    a. 2012 Myoview: EF 63%, no isch/infarct;  b. 04/2016 Lexiscan MV: EF 73%, no ischemia/infarct-->Low risk.  . Chronic diastolic CHF (congestive heart failure) ([place])    a. 2015 Echo: EF 55-60%, Gr2 DD;  b. 09/2015 Echo: EF 60-65%, no rwma, mod dil LA, PASP 82mmHg.  Marland Kitchen History of thyrotoxicosis   . Hypertensive heart disease 10/18/2013  . Insulin dependent type 2 diabetes mellitus, uncontrolled ([place])   .  Mediastinal adenopathy   . Morbid obesity with BMI of 50.0-59.9, adult ([facility])   . OSA (obstructive sleep apnea) 03/06/2011   MEDICATIONS AT HOME: Prior to Admission medications   Medication Sig Start Date End Date Taking? Authorizing Provider  albuterol (PROVENTIL HFA;VENTOLIN HFA) 108 (90 Base) MCG/ACT inhaler Inhale 1-2 puffs into the lungs every 6 (six) hours as needed for wheezing or shortness of breath. 11/04/15  Yes Shary Decamp, PA-C  amLODipine (NORVASC) 10 MG tablet Take 1 tablet (10 mg total) by mouth daily. 08/06/16  Yes Tresa Garter, MD  carvedilol (COREG) 3.125 MG tablet Take 1 tablet (3.125 mg total) by mouth 2 (two) times daily. 08/06/16  Yes Tresa Garter, MD  furosemide (LASIX) 80 MG tablet Take 1 tablet (80 mg total) by mouth daily. 08/06/16  Yes Tresa Garter, MD  glipiZIDE (GLUCOTROL XL) 10 MG 24 hr tablet Take 1 tablet (10 mg total) by mouth daily with breakfast. 08/06/16  Yes Gil Ingwersen E Deatra Mcmahen, MD  LORazepam (ATIVAN) 1 MG tablet Take 1 tablet (1 mg total) by mouth at bedtime. 07/02/14  Yes Tresa Garter, MD  losartan (COZAAR) 100 MG tablet Take 1 tablet (100 mg total) by mouth daily. 08/06/16  Yes Tresa Garter, MD  metFORMIN (GLUCOPHAGE) 1000 MG tablet Take 1 tablet (1,000 mg total) by mouth 2 (two) times daily with a meal. 08/06/16  Yes Neve Branscomb E Doreene Burke, MD  nitroGLYCERIN (NITROSTAT) 0.4 MG SL tablet Place 1 tablet (0.4 mg total) under  the tongue every 5 (five) minutes as needed for chest pain. Do not take more than 3 pills in one episode 05/14/16 08/12/16 Yes Rogelia Mire, NP  pantoprazole (PROTONIX) 40 MG tablet Take 1 tablet (40 mg total) by mouth daily. 08/06/16  Yes Tresa Garter, MD  potassium chloride (K-DUR) 10 MEQ tablet Take 1 tablet (10 mEq total) by mouth 2 (two) times daily. 08/06/16  Yes Tresa Garter, MD  risperiDONE (RISPERDAL) 2 MG tablet Take 2 tablets (4 mg total) by mouth daily. 08/06/16  Yes Tresa Garter, MD    aspirin EC 81 MG tablet Take 1 tablet (81 mg total) by mouth daily. Patient not taking: Reported on 08/06/2016 05/14/16   Rogelia Mire, NP  oxybutynin (DITROPAN XL) 15 MG 24 hr tablet Take 1 tablet (15 mg total) by mouth at bedtime. 08/06/16   Tresa Garter, MD    Objective:   Vitals:   08/06/16 1223  BP: (!) 154/82  Pulse: 78  Resp: 20  Temp: 98.1 F (36.7 C)  TempSrc: Oral  SpO2: 98%  Weight: (!) 337 lb 9.6 oz (153.1 kg)  Height: 5\' 7"  (1.702 m)   Exam General appearance : Awake, alert, not in any distress. Speech Clear. Not toxic looking, morbidly obese HEENT: Atraumatic and Normocephalic, pupils equally reactive to light and accomodation Neck: Supple, no JVD. No cervical lymphadenopathy.  Chest: Good air entry bilaterally, no added sounds  CVS: S1 S2 regular, no murmurs.  Abdomen: Bowel sounds present, Non tender and not distended with no gaurding, rigidity or rebound. Extremities: B/L Lower Ext shows no edema, both legs are warm to touch Neurology: Awake alert, and oriented X 3, CN II-XII intact, Non focal Skin: No Rash  Data Review Lab Results  Component Value Date   HGBA1C 9.9 08/06/2016   HGBA1C 11.7 (H) 11/12/2015   HGBA1C 12.1 (H) 10/05/2015   Assessment & Plan   1. Type 2 diabetes mellitus without complication, with long-term current use of insulin (HCC)  - POCT A1C is down to 9.9% from 11.7% - Glucose (CBG) - Microalbumin/Creatinine Ratio, Urine  Aim for 30 minutes of exercise most days. Rethink what you drink. Water is great! Aim for 2-3 Carb Choices per meal (30-45 grams) +/- 1 either way  Aim for 0-15 Carbs per snack if hungry  Include protein in moderation with your meals and snacks  Consider reading food labels for Total Carbohydrate and Fat Grams of foods  Consider checking BG at alternate times per day  Continue taking medication as directed Be mindful about how much sugar you are adding to beverages and other foods. Fruit Punch -  find one with no sugar  Measure and decrease portions of carbohydrate foods  Make your plate and don't go back for seconds  2. Bipolar 1 disorder, mixed, moderate (HCC)  - risperiDONE (RISPERDAL) 2 MG tablet; Take 2 tablets (4 mg total) by mouth daily.  Dispense: 60 tablet; Refill: 3  3. Type 2 diabetes mellitus without complication, without long-term current use of insulin (HCC)  - metFORMIN (GLUCOPHAGE) 1000 MG tablet; Take 1 tablet (1,000 mg total) by mouth 2 (two) times daily with a meal.  Dispense: 180 tablet; Refill: 3 - glipiZIDE (GLUCOTROL XL) 10 MG 24 hr tablet; Take 1 tablet (10 mg total) by mouth daily with breakfast.  Dispense: 90 tablet; Refill: 3  4. Essential hypertension  - potassium chloride (K-DUR) 10 MEQ tablet; Take 1 tablet (10 mEq total) by mouth 2 (  two) times daily.  Dispense: 60 tablet; Refill: 3 - losartan (COZAAR) 100 MG tablet; Take 1 tablet (100 mg total) by mouth daily.  Dispense: 90 tablet; Refill: 3 - furosemide (LASIX) 80 MG tablet; Take 1 tablet (80 mg total) by mouth daily.  Dispense: 90 tablet; Refill: 3 - carvedilol (COREG) 3.125 MG tablet; Take 1 tablet (3.125 mg total) by mouth 2 (two) times daily.  Dispense: 180 tablet; Refill: 3 - amLODipine (NORVASC) 10 MG tablet; Take 1 tablet (10 mg total) by mouth daily.  Dispense: 90 tablet; Refill: 3  - CBC with Differential/Platelet - COMPLETE METABOLIC PANEL WITH GFR - Lipid panel - Urinalysis, Complete  5. Acute diastolic congestive heart failure (HCC)  - furosemide (LASIX) 80 MG tablet; Take 1 tablet (80 mg total) by mouth daily.  Dispense: 90 tablet; Refill: 3 - carvedilol (COREG) 3.125 MG tablet; Take 1 tablet (3.125 mg total) by mouth 2 (two) times daily.  Dispense: 180 tablet; Refill: 3  6. Stress incontinence in female Start - oxybutynin (DITROPAN XL) 15 MG 24 hr tablet; Take 1 tablet (15 mg total) by mouth at bedtime.  Dispense: 30 tablet; Refill: 3  7. Gastroesophageal reflux disease without  esophagitis  - pantoprazole (PROTONIX) 40 MG tablet; Take 1 tablet (40 mg total) by mouth daily.  Dispense: 90 tablet; Refill: 3  Patient have been counseled extensively about nutrition and exercise. Other issues discussed during this visit include: low cholesterol diet, weight control and daily exercise, foot care, annual eye examinations at Ophthalmology, importance of adherence with medications and regular follow-up. We also discussed long term complications of uncontrolled diabetes and hypertension.   Return in about 3 months (around 11/06/2016) for Hemoglobin A1C and Follow up, DM, Heart Failure and Hypertension.  The patient was given clear instructions to go to ER or return to medical center if symptoms don't improve, worsen or new problems develop. The patient verbalized understanding. The patient was told to call to get lab results if they haven't heard anything in the next week.   This note has been created with Surveyor, quantity. Any transcriptional errors are unintentional.    [name], MD, [place], [place], [place], [place] and [place], [place]   08/06/2016, 12:43 PM

## 2016-08-06 NOTE — Progress Notes (Signed)
Patient is here for DM  Patient denies pain at this time.  Patient has not taken medication today. Patient has eaten today.  Patient would like flu vaccine today.

## 2016-08-06 NOTE — Patient Instructions (Signed)
Obesity Obesity is defined as having too much total body fat and a body mass index (BMI) of 30 or more. BMI is an estimate of body fat and is calculated from your height and weight. BMI is typically calculated by your health care provider during regular wellness visits. Obesity happens when you consume more calories than you can burn by exercising or performing daily physical tasks. Prolonged obesity can cause major illnesses or emergencies, such as:  Stroke.  Heart disease.  Diabetes.  Cancer.  Arthritis.  High blood pressure (hypertension).  High cholesterol.  Sleep apnea.  Erectile dysfunction.  Infertility problems. CAUSES   Regularly eating unhealthy foods.  Physical inactivity.  Certain disorders, such as an underactive thyroid (hypothyroidism), Cushing's syndrome, and polycystic ovarian syndrome.  Certain medicines, such as steroids, some depression medicines, and antipsychotics.  Genetics.  Lack of sleep. DIAGNOSIS A health care provider can diagnose obesity after calculating your BMI. Obesity will be diagnosed if your BMI is 30 or higher. There are other methods of measuring obesity levels. Some other methods include measuring your skinfold thickness, your waist circumference, and comparing your hip circumference to your waist circumference. TREATMENT  A healthy treatment program includes some or all of the following:  Long-term dietary changes.  Exercise and physical activity.  Behavioral and lifestyle changes.  Medicine only under the supervision of your health care provider. Medicines may help, but only if they are used with diet and exercise programs. If your BMI is 40 or higher, your health care provider may recommend specialized surgery or programs to help with weight loss. An unhealthy treatment program includes:  Fasting.  Fad diets.  Supplements and drugs. These choices do not succeed in long-term weight control. HOME CARE  INSTRUCTIONS  Exercise and perform physical activity as directed by your health care provider. To increase physical activity, try the following:  Use stairs instead of elevators.  Park farther away from store entrances.  Garden, bike, or walk instead of watching television or using the computer.  Eat healthy, low-calorie foods and drinks on a regular basis. Eat more fruits and vegetables. Use low-calorie cookbooks or take healthy cooking classes.  Limit fast food, sweets, and processed snack foods.  Eat smaller portions.  Keep a daily journal of everything you eat. There are many free websites to help you with this. It may be helpful to measure your foods so you can determine if you are eating the correct portion sizes.  Avoid drinking alcohol. Drink more water and drinks without calories.  Take vitamins and supplements only as recommended by your health care provider.  Weight-loss support groups, Tax adviser, counselors, and stress reduction education can also be very helpful. SEEK IMMEDIATE MEDICAL CARE IF:  You have chest pain or tightness.  You have trouble breathing or feel short of breath.  You have weakness or leg numbness.  You feel confused or have trouble talking.  You have sudden changes in your vision.   This information is not intended to replace advice given to you by your health care provider. Make sure you discuss any questions you have with your health care provider.   Document Released: 10/22/2004 Document Revised: 10/05/2014 Document Reviewed: 10/21/2011 Elsevier Interactive Patient Education 2016 [place] Carbohydrate Counting for Diabetes Mellitus Carbohydrate counting is a method for keeping track of the amount of carbohydrates you eat. Eating carbohydrates naturally increases the level of sugar (glucose) in your blood, so it is important for you to know the amount that is  okay for you to have in every meal. Carbohydrate counting  helps keep the level of glucose in your blood within normal limits. The amount of carbohydrates allowed is different for every person. A dietitian can help you calculate the amount that is right for you. Once you know the amount of carbohydrates you can have, you can count the carbohydrates in the foods you want to eat. Carbohydrates are found in the following foods:  Grains, such as breads and cereals.  Dried beans and soy products.  Starchy vegetables, such as potatoes, peas, and corn.  Fruit and fruit juices.  Milk and yogurt.  Sweets and snack foods, such as cake, cookies, candy, chips, soft drinks, and fruit drinks. CARBOHYDRATE COUNTING There are two ways to count the carbohydrates in your food. You can use either of the methods or a combination of both. Reading the "Nutrition Facts" on [place] The "Nutrition Facts" is an area that is included on the labels of almost all packaged food and beverages in the [place]. It includes the serving size of that food or beverage and information about the nutrients in each serving of the food, including the grams (g) of carbohydrate per serving.  Decide the number of servings of this food or beverage that you will be able to eat or drink. Multiply that number of servings by the number of grams of carbohydrate that is listed on the label for that serving. The total will be the amount of carbohydrates you will be having when you eat or drink this food or beverage. Learning Standard Serving Sizes of Food When you eat food that is not packaged or does not include "Nutrition Facts" on the label, you need to measure the servings in order to count the amount of carbohydrates.A serving of most carbohydrate-rich foods contains about 15 g of carbohydrates. The following list includes serving sizes of carbohydrate-rich foods that provide 15 g ofcarbohydrate per serving:   1 slice of bread (1 oz) or 1 six-inch tortilla.    of a hamburger bun or  English muffin.  4-6 crackers.   cup unsweetened dry cereal.    cup hot cereal.   cup rice or pasta.    cup mashed potatoes or  of a large baked potato.  1 cup fresh fruit or one small piece of fruit.    cup canned or frozen fruit or fruit juice.  1 cup milk.   cup plain fat-free yogurt or yogurt sweetened with artificial sweeteners.   cup cooked dried beans or starchy vegetable, such as peas, corn, or potatoes.  Decide the number of standard-size servings that you will eat. Multiply that number of servings by 15 (the grams of carbohydrates in that serving). For example, if you eat 2 cups of strawberries, you will have eaten 2 servings and 30 g of carbohydrates (2 servings x 15 g = 30 g). For foods such as soups and casseroles, in which more than one food is mixed in, you will need to count the carbohydrates in each food that is included. EXAMPLE OF CARBOHYDRATE COUNTING Sample Dinner  3 oz chicken breast.   cup of brown rice.   cup of corn.  1 cup milk.   1 cup strawberries with sugar-free whipped topping.  Carbohydrate Calculation Step 1: Identify the foods that contain carbohydrates:   Rice.   Corn.   Milk.   Strawberries. Step 2:Calculate the number of servings eaten of each:   2 servings of rice.   1  serving of corn.   1 serving of milk.   1 serving of strawberries. Step 3: Multiply each of those number of servings by 15 g:   2 servings of rice x 15 g = 30 g.   1 serving of corn x 15 g = 15 g.   1 serving of milk x 15 g = 15 g.   1 serving of strawberries x 15 g = 15 g. Step 4: Add together all of the amounts to find the total grams of carbohydrates eaten: 30 g + 15 g + 15 g + 15 g = 75 g.   This information is not intended to replace advice given to you by your health care provider. Make sure you discuss any questions you have with your health care provider.   Document Released: 09/14/2005 Document Revised: 10/05/2014  Document Reviewed: 08/11/2013 Elsevier Interactive Patient Education [employer].

## 2016-08-07 LAB — URINALYSIS, COMPLETE
BILIRUBIN URINE: NEGATIVE
Bacteria, UA: NONE SEEN [HPF]
Casts: NONE SEEN [LPF]
Crystals: NONE SEEN [HPF]
Hgb urine dipstick: NEGATIVE
Ketones, ur: NEGATIVE
LEUKOCYTES UA: NEGATIVE
NITRITE: NEGATIVE
RBC / HPF: NONE SEEN RBC/HPF (ref <=2)
SPECIFIC GRAVITY, URINE: 1.028 (ref 1.001–1.035)
Yeast: NONE SEEN [HPF]
pH: 5.5 (ref 5.0–8.0)

## 2016-08-07 LAB — MICROALBUMIN / CREATININE URINE RATIO
CREATININE, URINE: 236 mg/dL (ref 20–320)
MICROALB UR: 117.5 mg/dL
MICROALB/CREAT RATIO: 498 ug/mg{creat} — AB (ref <30)

## 2016-08-10 ENCOUNTER — Telehealth: Payer: Self-pay | Admitting: *Deleted

## 2016-08-10 NOTE — Telephone Encounter (Signed)
MA informed patient via VM of blood sugar being stable. Patient advised to continue controlling blood sugar and engage in regular exercise at least 150 minutes per week. Patient was provided [facility] telephone contact for any questions or concerns.

## 2016-08-10 NOTE — Telephone Encounter (Signed)
-----   Message from Tresa Garter, MD sent at 08/10/2016 11:31 AM EST ----- Please inform patient that her test results are stable. Continue to control blood sugar, engage in regular physical exercise at least 150 minutes a week .

## 2016-09-02 MED FILL — OXYBUTYNIN CL ER 15 MG TAB: 15 | 30 days supply | Qty: 30 | Fill #0

## 2016-09-02 MED FILL — metFORMIN HCL 1000 MG TABS: 1000 | 30 days supply | Qty: 60 | Fill #0

## 2016-09-02 MED FILL — glipiZIDE XL 10 MG TB24: 10 | 30 days supply | Qty: 30 | Fill #0

## 2016-09-02 MED FILL — POTASSIUM CL 10 MEQ TAB SA: 10 | 30 days supply | Qty: 60 | Fill #0

## 2016-09-02 MED FILL — LOSARTAN POTASSIUM 100 MG T: 100 | 30 days supply | Qty: 30 | Fill #0

## 2016-09-02 MED FILL — FUROSEMIDE 80 MG TABLET: 80 | 30 days supply | Qty: 30 | Fill #0

## 2016-09-02 MED FILL — risperiDONE 2 MG TABS: 2 | 30 days supply | Qty: 60 | Fill #0

## 2016-09-02 MED FILL — AMLODIPINE BESYLATE 10 MG T: 10 | 30 days supply | Qty: 30 | Fill #0

## 2016-10-23 MED FILL — OXYBUTYNIN CL ER 15 MG TAB: 15 | 30 days supply | Qty: 30 | Fill #1

## 2016-10-23 MED FILL — CARVEDILOL 3.125 MG TABLET: 3.125 | 30 days supply | Qty: 60 | Fill #0

## 2016-10-23 MED FILL — LOSARTAN POTASSIUM 100 MG T: 100 | 30 days supply | Qty: 30 | Fill #1

## 2016-10-23 MED FILL — glipiZIDE XL 10 MG TB24: 10 | 30 days supply | Qty: 30 | Fill #1

## 2016-10-23 MED FILL — risperiDONE 2 MG TABS: 2 | 30 days supply | Qty: 60 | Fill #1

## 2016-10-23 MED FILL — AMLODIPINE BESYLATE 10 MG T: 10 | 30 days supply | Qty: 30 | Fill #1

## 2016-10-23 MED FILL — FUROSEMIDE 80 MG TABLET: 80 | 30 days supply | Qty: 30 | Fill #1

## 2016-10-23 MED FILL — VENTOLIN HFA 90 MCG INHALER: 108 (90 BAS | 25 days supply | Qty: 18 | Fill #0

## 2016-10-26 ENCOUNTER — Telehealth: Payer: Self-pay | Admitting: *Deleted

## 2016-10-26 NOTE — Telephone Encounter (Signed)
Pt. states she has had SOB for a long time. States when she does exercise she is SOB and dizzy. Took inhaler today.Unable to use inhaler because she needed refill. While away to find provider, Monna Fam, CMA came to [facility] that patient did not want to be seen. When nurse came back to inform patient, she was at the scale laughing with her mother. No acute distress or dyspnea assessed. Pt informed  that she would be able to see PA today. Pt stated she would come back, did not want to see provider today. She stated she will make an appointment with Dr.Jegede. Pt has inhaler in hand from pharmacy.  T: 99.2  P: 83  R: 30  BP: 160/102 SpO2: 96%

## 2016-11-02 ENCOUNTER — Ambulatory Visit: Payer: No Typology Code available for payment source | Attending: Internal Medicine

## 2016-12-03 ENCOUNTER — Emergency Department (HOSPITAL_COMMUNITY)
Admission: EM | Admit: 2016-12-03 | Discharge: 2016-12-03 | Disposition: A | Discharge status: Home / Self Care | Payer: No Typology Code available for payment source | Attending: Emergency Medicine | Admitting: Emergency Medicine

## 2016-12-03 ENCOUNTER — Other Ambulatory Visit: Payer: Self-pay

## 2016-12-03 ENCOUNTER — Encounter (HOSPITAL_COMMUNITY): Payer: Self-pay

## 2016-12-03 ENCOUNTER — Ambulatory Visit: Payer: No Typology Code available for payment source | Admitting: Emergency Medicine

## 2016-12-03 ENCOUNTER — Emergency Department (HOSPITAL_COMMUNITY): Payer: No Typology Code available for payment source

## 2016-12-03 DIAGNOSIS — I5033 Acute on chronic diastolic (congestive) heart failure: Secondary | ICD-10-CM | POA: Insufficient documentation

## 2016-12-03 DIAGNOSIS — Z7984 Long term (current) use of oral hypoglycemic drugs: Secondary | ICD-10-CM | POA: Insufficient documentation

## 2016-12-03 DIAGNOSIS — I252 Old myocardial infarction: Secondary | ICD-10-CM | POA: Insufficient documentation

## 2016-12-03 DIAGNOSIS — Z79899 Other long term (current) drug therapy: Secondary | ICD-10-CM | POA: Insufficient documentation

## 2016-12-03 DIAGNOSIS — R0789 Other chest pain: Secondary | ICD-10-CM | POA: Insufficient documentation

## 2016-12-03 DIAGNOSIS — J189 Pneumonia, unspecified organism: Secondary | ICD-10-CM

## 2016-12-03 DIAGNOSIS — R0602 Shortness of breath: Secondary | ICD-10-CM

## 2016-12-03 DIAGNOSIS — I110 Hypertensive heart disease with heart failure: Secondary | ICD-10-CM | POA: Insufficient documentation

## 2016-12-03 LAB — CBC
HEMATOCRIT: 31.7 % — AB (ref 36.0–46.0)
HEMOGLOBIN: 9.4 g/dL — AB (ref 12.0–15.0)
MCH: 21.5 pg — AB (ref 26.0–34.0)
MCHC: 29.7 g/dL — ABNORMAL LOW (ref 30.0–36.0)
MCV: 72.4 fL — AB (ref 78.0–100.0)
Platelets: 300 10*3/uL (ref 150–400)
RBC: 4.38 MIL/uL (ref 3.87–5.11)
RDW: 17.3 % — ABNORMAL HIGH (ref 11.5–15.5)
WBC: 6.7 10*3/uL (ref 4.0–10.5)

## 2016-12-03 LAB — BASIC METABOLIC PANEL
Anion gap: 7 (ref 5–15)
BUN: 11 mg/dL (ref 6–20)
CHLORIDE: 103 mmol/L (ref 101–111)
CO2: 26 mmol/L (ref 22–32)
CREATININE: 0.54 mg/dL (ref 0.44–1.00)
Calcium: 9.1 mg/dL (ref 8.9–10.3)
GFR calc non Af Amer: >60 mL/min (ref >60)
Glucose, Bld: 242 mg/dL — ABNORMAL HIGH (ref 65–99)
Potassium: 4.1 mmol/L (ref 3.5–5.1)
Sodium: 136 mmol/L (ref 135–145)

## 2016-12-03 LAB — BRAIN NATRIURETIC PEPTIDE: B Natriuretic Peptide: 204.5 pg/mL — ABNORMAL HIGH (ref 0.0–100.0)

## 2016-12-03 LAB — I-STAT TROPONIN, ED: Troponin i, poc: 0 ng/mL (ref 0.00–0.08)

## 2016-12-03 MED ORDER — FUROSEMIDE 10 MG/ML IJ SOLN
60.0000 mg | Freq: Once | INTRAMUSCULAR | Status: AC
  Administered 2016-12-03: 60 mg via INTRAVENOUS
  Filled 2016-12-03: qty 8

## 2016-12-03 NOTE — ED Notes (Signed)
RN will collect labs at IV start 

## 2016-12-03 NOTE — ED Notes (Signed)
AMBULATING O2 SAT 91-92%. DR. Leonette Monarch, MADE AWARE.

## 2016-12-03 NOTE — ED Notes (Signed)
PT being moved to rm 23

## 2016-12-03 NOTE — ED Notes (Signed)
PT DISCHARGED. INSTRUCTIONS GIVEN. AAOX4. PT IN NO APPARENT DISTRESS OR PAIN. THE OPPORTUNITY TO ASK QUESTIONS WAS PROVIDED. 

## 2016-12-03 NOTE — ED Provider Notes (Signed)
[facility] DEPT Provider Note   CSN: [id number] Arrival date & time: 12/03/16  0957     History   Chief Complaint Chief Complaint  Patient presents with  . Shortness of Breath  . Chest Pain    HPI [name] is a 51 y.o. female.  The history is provided by the patient.  Shortness of Breath  This is a recurrent problem. The average episode lasts 5 days. The problem occurs continuously.The problem has been gradually worsening. Associated symptoms include cough (dry), orthopnea, chest pain and leg swelling. She has tried nothing for the symptoms. Associated medical issues include heart failure ( noncompliant with lasix (does not take it all the time)) and past MI. Associated medical issues do not include PE.  Chest Pain   This is a recurrent problem. Episode onset: 5 days. The problem occurs constantly. The pain is present in the substernal region. The quality of the pain is described as sharp. Associated symptoms include cough (dry), orthopnea and shortness of breath. She has tried rest for the symptoms. The treatment provided mild relief. Risk factors include obesity.  Her past medical history is significant for CHF, diabetes, hypertension and MI.  Pertinent negatives for past medical history include no hyperlipidemia and no PE.    Past Medical History:  Diagnosis Date  . Bipolar disease, chronic ([place])   . Chest pain    a. 2012 Myoview: EF 63%, no isch/infarct;  b. 04/2016 Lexiscan MV: EF 73%, no ischemia/infarct-->Low risk.  . CHF (congestive heart failure) ([place])   . Chronic diastolic CHF (congestive heart failure) ([place])    a. 2015 Echo: EF 55-60%, Gr2 DD;  b. 09/2015 Echo: EF 60-65%, no rwma, mod dil LA, PASP 10mHg.  .Marland KitchenHistory of thyrotoxicosis   . Hypertensive heart disease 10/18/2013  . Insulin dependent type 2 diabetes mellitus, uncontrolled ([place]   . Mediastinal adenopathy   . MI (myocardial infarction)   . Morbid obesity with BMI of 50.0-59.9, adult ([place]   . OSA  (obstructive sleep apnea) 03/06/2011    Patient Active Problem List   Diagnosis Date Noted  . Mycobacterium avium complex ([place] 12/13/2015  . Pyrexia   . Dyspnea 11/13/2015  . Mediastinal adenopathy 11/13/2015  . Acute on chronic diastolic heart failure ([place] 11/13/2015  . Abnormal CT scan, chest 11/11/2015  . Chest pain 11/11/2015  . Atypical chest pain 11/11/2015  . Acute diastolic CHF (congestive heart failure) ([place] 10/03/2015  . Essential hypertension 03/07/2015  . Depression (emotion) 03/07/2015  . Noninfectious gastroenteritis and colitis 01/02/2015  . Sinusitis, chronic 01/02/2015  . Midline low back pain without sciatica 09/10/2014  . Bipolar 1 disorder, mixed, moderate ([place] 07/02/2014  . Stress incontinence 07/02/2014  . Mania ([place] 12/10/2013  . Speech abnormality 12/08/2013  . SVT (supraventricular tachycardia) ([place] 12/06/2013  . Essential hypertension, benign 11/28/2013  . HTN (hypertension) 11/28/2013  . Diabetes (HDelta 11/28/2013  . Pulmonary HTN, moderate to severe 11/03/2013  . Acute on chronic diastolic congestive heart failure ([place] 11/02/2013  . Hypertensive heart disease 10/18/2013  . IDDM (insulin dependent diabetes mellitus) ([place] 10/18/2013  . Chronic diastolic congestive heart failure (HLake [place] 07/23/2011  . OSA (obstructive sleep apnea)- non compliant with C-pap 03/06/2011  . Morbid obesity (HGas 02/19/2011  . Bipolar disorder     History reviewed. No pertinent surgical history.  OB History    No data available       Home Medications    Prior to Admission medications   Medication Sig Start Date  End Date Taking? Authorizing Provider  albuterol (PROVENTIL HFA;VENTOLIN HFA) 108 (90 Base) MCG/ACT inhaler Inhale 1-2 puffs into the lungs every 6 (six) hours as needed for wheezing or shortness of breath. 11/04/15  Yes Shary Decamp, PA-C  amLODipine (NORVASC) 10 MG tablet Take 1 tablet (10 mg total) by mouth daily. 08/06/16  Yes Tresa Garter, MD    carvedilol (COREG) 3.125 MG tablet Take 1 tablet (3.125 mg total) by mouth 2 (two) times daily. 08/06/16  Yes Tresa Garter, MD  furosemide (LASIX) 80 MG tablet Take 1 tablet (80 mg total) by mouth daily. 08/06/16  Yes Tresa Garter, MD  glipiZIDE (GLUCOTROL XL) 10 MG 24 hr tablet Take 1 tablet (10 mg total) by mouth daily with breakfast. Patient taking differently: Take 10 mg by mouth every evening.  08/06/16  Yes Olugbemiga E Doreene Burke, MD  LORazepam (ATIVAN) 1 MG tablet Take 1 tablet (1 mg total) by mouth at bedtime. 07/02/14  Yes Tresa Garter, MD  losartan (COZAAR) 100 MG tablet Take 1 tablet (100 mg total) by mouth daily. 08/06/16  Yes Tresa Garter, MD  metFORMIN (GLUCOPHAGE) 1000 MG tablet Take 1 tablet (1,000 mg total) by mouth 2 (two) times daily with a meal. Patient taking differently: Take 500 mg by mouth 2 (two) times daily with a meal.  08/06/16  Yes Olugbemiga E Doreene Burke, MD  oxybutynin (DITROPAN XL) 15 MG 24 hr tablet Take 1 tablet (15 mg total) by mouth at bedtime. 08/06/16  Yes Tresa Garter, MD  potassium chloride (K-DUR) 10 MEQ tablet Take 1 tablet (10 mEq total) by mouth 2 (two) times daily. Patient taking differently: Take 10 mEq by mouth daily.  08/06/16  Yes Tresa Garter, MD  risperiDONE (RISPERDAL) 2 MG tablet Take 2 tablets (4 mg total) by mouth daily. 08/06/16  Yes Tresa Garter, MD  aspirin EC 81 MG tablet Take 1 tablet (81 mg total) by mouth daily. Patient not taking: Reported on 08/06/2016 05/14/16   Rogelia Mire, NP  nitroGLYCERIN (NITROSTAT) 0.4 MG SL tablet Place 1 tablet (0.4 mg total) under the tongue every 5 (five) minutes as needed for chest pain. Do not take more than 3 pills in one episode 05/14/16 08/12/16  Rogelia Mire, NP  pantoprazole (PROTONIX) 40 MG tablet Take 1 tablet (40 mg total) by mouth daily. Patient not taking: Reported on 12/03/2016 08/06/16   Tresa Garter, MD    Family History Family History   Problem Relation Age of Onset  . Heart failure Father   . Stroke Father   . Hypertension Mother   . Heart disease Maternal Grandfather   . Heart disease Paternal Grandfather     Social History Social History  Substance Use Topics  . Smoking status: Never Smoker  . Smokeless tobacco: Never Used  . Alcohol use No     Allergies   Acetaminophen; Caffeine; and Lisinopril   Review of Systems Review of Systems  Respiratory: Positive for cough (dry) and shortness of breath.   Cardiovascular: Positive for chest pain, orthopnea and leg swelling.  Ten systems are reviewed and are negative for acute change except as noted in the HPI    Physical Exam Updated Vital Signs BP 173/95 (BP Location: Right Arm)   Pulse 80   Resp 22   Ht _0  (1.702 m)   Wt (!) 334 lb (151.5 kg)   SpO2 96%   BMI 52.31 kg/m   Physical Exam  Constitutional:  She is oriented to person, place, and time. She appears well-developed and well-nourished. No distress.  HENT:  Head: Normocephalic and atraumatic.  Nose: Nose normal.  Eyes: Conjunctivae and EOM are normal. Pupils are equal, round, and reactive to light. Right eye exhibits no discharge. Left eye exhibits no discharge. No scleral icterus.  Neck: Normal range of motion. Neck supple. No JVD present.  Cardiovascular: Normal rate and regular rhythm.  Exam reveals no gallop and no friction rub.   No murmur heard. Pulmonary/Chest: Effort normal and breath sounds normal. No stridor. No respiratory distress. She has no rales.  Abdominal: Soft. She exhibits no distension. There is no tenderness.  Musculoskeletal: She exhibits no edema or tenderness.  Mild BLE edema  Neurological: She is alert and oriented to person, place, and time.  Skin: Skin is warm and dry. No rash noted. She is not diaphoretic. No erythema.  Psychiatric: She has a normal mood and affect.  Vitals reviewed.    ED Treatments / Results  Labs (all labs ordered are listed, but only  abnormal results are displayed) Labs Reviewed  BASIC METABOLIC PANEL - Abnormal; Notable for the following:       Result Value   Glucose, Bld 242 (*)    All other components within normal limits  CBC - Abnormal; Notable for the following:    Hemoglobin 9.4 (*)    HCT 31.7 (*)    MCV 72.4 (*)    MCH 21.5 (*)    MCHC 29.7 (*)    RDW 17.3 (*)    All other components within normal limits  BRAIN NATRIURETIC PEPTIDE - Abnormal; Notable for the following:    B Natriuretic Peptide 204.5 (*)    All other components within normal limits  I-STAT TROPOININ, ED    EKG  EKG Interpretation  Date/Time:  Thursday December 03 2016 10:29:09 EST Ventricular Rate:  77 PR Interval:    QRS Duration: 93 QT Interval:  409 QTC Calculation: 463 R Axis:   107 Text Interpretation:  Sinus rhythm Probable left atrial enlargement Lateral infarct, age indeterminate No significant change since last tracing Confirmed by [facility] MD, Anfernee Peschke [phone]) on 12/03/2016 1:58:37 PM       Radiology Dg Chest 2 View  Result Date: 12/03/2016 CLINICAL DATA:  51 year old female with progressive intermittent chest pain and shortness of breath for 5 days. Initial encounter. EXAM: CHEST  2 VIEW COMPARISON:  High-resolution chest CT 02/03/2016 and earlier. FINDINGS: Mild cardiomegaly has not significantly changed. Other mediastinal contours are stable. Visualized tracheal air column is within normal limits. Increased indistinct appearance of pulmonary vascularity and mildly increased interstitial opacity compared to the chest radiographs on 12/13/2015. No pneumothorax or pleural effusion. No confluent pulmonary opacity. No acute osseous abnormality identified. Negative visible bowel gas pattern. IMPRESSION: Acute pulmonary edema suspected.  No pleural effusion. Electronically Signed   By: Genevie Ann M.D.   On: 12/03/2016 11:09    Procedures Procedures (including critical care time)  Medications Ordered in ED Medications  furosemide  (LASIX) injection 60 mg (60 mg Intravenous Given 12/03/16 1455)     Initial Impression / Assessment and Plan / ED Course  I have reviewed the triage vital signs and the nursing notes.  Pertinent labs & imaging results that were available during my care of the patient were reviewed by me and considered in my medical decision making (see chart for details).  Clinical Course as of Dec 03 1748  Thu Dec 03, 2016  1300 5  days of constant atypical chest pain. EKG without acute ischemic changes or evidence of pericarditis. Initial troponin negative. This is sufficient to rule out ACS given the duration of the pain. Chest x-ray with possible pulmonary edema however no evidence of pneumonia, pneumothorax, pneumomediastinum or abnormal contour of the mediastinum. PERC negative doubt PE. Presentation is classic for aortic dissection or esophageal perforation.  Patient has had a history of CHF and has been noncompliant with her Lasix. Mild lower extremity edema on exam. Patient is satting well on room air at rest and with ambulation. We'll provide a dose of her Lasix here in the emergency department. Patient has a appointment with her pulmonologist for today. We'll touch base with them.  [PC]  [id number] Spoke with Dr. Lamonte Sakai who is going to see the patient earlier today who is following up to evaluate the patient for CT findings that were concerning for bronchiectasis. They will arrange to see at a later time.  [PC]  3335 Will discuss with cardiology to get close follow up for pt as I feel she is appropriate for DC with strict return precautions.  [PC]    Clinical Course User Index [PC] Fatima Blank, MD    Unable to reach Cardiologist's (Dr. Gwenlyn Found) office at this time. Will send Epic message and have pt call in the am as well.    Final Clinical Impressions(s) / ED Diagnoses   Final diagnoses:  Atypical chest pain  Shortness of breath   Disposition: Discharge  Condition: Good  I have discussed  the results, Dx and Tx plan with the patient who expressed understanding and agree(s) with the plan. Discharge instructions discussed at great length. The patient was given strict return precautions who verbalized understanding of the instructions. No further questions at time of discharge.    New Prescriptions   No medications on file    Follow Up: Lorretta Harp, MD [address] [place] [address] [phone]  Call in 1 day For close follow up  Tresa Garter, MD [place] [id number] [phone]  Schedule an appointment as soon as possible for a visit  As needed      Fatima Blank, MD 12/03/16 1759

## 2016-12-03 NOTE — ED Triage Notes (Signed)
Patient states she has been having intermittent chest pain with SOB x 5 days. Patient reports aq history of MI and also reports that she is scheduled to a cardiologist this afternoon at 4 PM.

## 2016-12-03 NOTE — ED Triage Notes (Signed)
Pt reports a 5 day hx of chest pain and shortness of breath. Pt was at school and the checked her O2 sat=88%. Increased chest pressure and shortness of breath this am. Pt is scheduled to be seen by [facility] at 4pm. Pt is alert, oriented and conversive. O2 sat 96. Resp 22.Pulse 81.

## 2016-12-08 NOTE — Progress Notes (Signed)
Pt is scheduled for an appt with Almyra Deforest, PA on 12/16/16.

## 2016-12-15 ENCOUNTER — Ambulatory Visit (INDEPENDENT_AMBULATORY_CARE_PROVIDER_SITE_OTHER)
Admission: RE | Admit: 2016-12-15 | Discharge: 2016-12-15 | Disposition: A | Discharge status: Home / Self Care | Payer: No Typology Code available for payment source | Source: Ambulatory Visit | Attending: Emergency Medicine | Admitting: Emergency Medicine

## 2016-12-15 ENCOUNTER — Inpatient Hospital Stay: Admission: RE | Admit: 2016-12-15 | Payer: No Typology Code available for payment source | Source: Ambulatory Visit

## 2016-12-15 DIAGNOSIS — J189 Pneumonia, unspecified organism: Secondary | ICD-10-CM

## 2016-12-16 ENCOUNTER — Ambulatory Visit (INDEPENDENT_AMBULATORY_CARE_PROVIDER_SITE_OTHER): Payer: No Typology Code available for payment source | Admitting: Physician Assistant

## 2016-12-16 ENCOUNTER — Encounter: Payer: Self-pay | Admitting: Physician Assistant

## 2016-12-16 VITALS — BP 195/107 | HR 91 | Ht 67.0 in | Wt 348.6 lb

## 2016-12-16 DIAGNOSIS — I10 Essential (primary) hypertension: Secondary | ICD-10-CM

## 2016-12-16 DIAGNOSIS — G8929 Other chronic pain: Secondary | ICD-10-CM

## 2016-12-16 DIAGNOSIS — E118 Type 2 diabetes mellitus with unspecified complications: Secondary | ICD-10-CM

## 2016-12-16 DIAGNOSIS — R079 Chest pain, unspecified: Secondary | ICD-10-CM

## 2016-12-16 DIAGNOSIS — I5033 Acute on chronic diastolic (congestive) heart failure: Secondary | ICD-10-CM

## 2016-12-16 MED ORDER — CARVEDILOL 6.25 MG PO TABS: 6.2500 mg | ORAL_TABLET | Freq: Two times a day (BID) | ORAL | 3 refills | Status: DC

## 2016-12-16 NOTE — Patient Instructions (Signed)
Your physician has recommended you make the following change in your medication:  START FUROSEMIDE 80 MG  DAILY INCREASE CARVEDILOL TO 6.25 MG TWICE  DAILY   Your physician recommends that you return for lab work in:  [place] [place]  [place] physician recommends that you schedule a follow-up appointment in:  [place] NP

## 2016-12-16 NOTE — Progress Notes (Signed)
Cardiology Office Note    Date:  12/16/2016   ID:  Vanessa, [name] 21-Dec-1965, MRN [id number]  PCP:  [name], MD  Cardiologist:  Dr. [name]  Chief Complaint  Patient presents with  . Chest Pain    pt states feeling heaviness in her chest when walking a little bit   . Shortness of Breath    when walking   . Edema    in both feet   . Follow-up    seen for Dr. [name]    History of Present Illness:  [name] is a 51 y.o. female with PMH of chronic chest pain, diastolic heart failure, hypertensive heart disease with history of hypertensive urgency, DM II, obstructive sleep apnea and abnormal chest CT with mediastinal adenopathy followed by pulmonology. She had normal Myoview in 2012. She later presented with recurrent chest pain with recommendation for diagnostic cardiac catheterization, however she declined. She has been treated medically since. She was here evaluated by pulmonology in August 2017 secondary to history of abnormal chest CT with mediastinal adenopathy, basilar groundglass infiltrates, and prior Mycobacterium avium infection. She was last seen on 06/16/2016, review of the previously mentioned shows absence of coronary cusp patient on high resolution CT in May 2017.   More recently, patient was seen in the emergency room on 12/03/2016 with 5 day history of chest pain and shortness of breath. She was not compliant with her Lasix and did have this small amount of lower extremity edema. Her BNP was only 204.5. Troponin negative. He has chronic anemia with hemoglobin at stable level. Chest x-ray suspected acute pulmonary edema, however no pleural effusion. EKG showed normal significant ST-T wave changes. More recently, she has had a CT chest without contrast on 12/15/2016, this showed grossly stable mediastinal and bilateral hilar adenopathy, low lung volume with mild nonspecific groundglass attenuation, no acute thoracic process.  Patient presents today for  cardiogenic office evaluation. She continued to have exertional shortness of breath and very mild exertional chest pain as well. She continued to refuse any cardiac catheterization. When asked about the reason, she just says "I'm afraid". She has gained more than 10 pounds since November. When asked about her diuretic, she says she has not been taking the diuretic as instructed. However she has been compliant with her blood pressure medication. 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 I told she will need to restart the Lasix as she appears to be volume overloaded on physical exam. I am fine with her take the Lasix at 2 PM on the day of school. Otherwise, I plan to obtain a basic metabolic panel in one week and to follow-up with her symptom in 3 weeks.   Past Medical History:  Diagnosis Date  . Bipolar disease, chronic ([place])   . Chest pain    a. 2012 Myoview: EF 63%, no isch/infarct;  b. 04/2016 Lexiscan MV: EF 73%, no ischemia/infarct-->Low risk.  . CHF (congestive heart failure) ([place])   . Chronic diastolic CHF (congestive heart failure) ([place])    a. 2015 Echo: EF 55-60%, Gr2 DD;  b. 09/2015 Echo: EF 60-65%, no rwma, mod dil LA, PASP 38mmHg.  Marland Kitchen History of thyrotoxicosis   . Hypertensive heart disease 10/18/2013  . Insulin dependent type 2 diabetes mellitus, uncontrolled ([place])   . Mediastinal adenopathy   . MI (myocardial infarction)   . Morbid obesity with BMI of 50.0-59.9, adult ([place])   .  OSA (obstructive sleep apnea) 03/06/2011    No past surgical history on file.  Current Medications: Outpatient Medications Prior to Visit  Medication Sig Dispense Refill  . albuterol (PROVENTIL HFA;VENTOLIN HFA) 108 (90 Base) MCG/ACT inhaler Inhale 1-2 puffs into the lungs every 6 (six) hours as needed for wheezing or shortness of breath. 1 Inhaler 0  . amLODipine (NORVASC) 10 MG tablet Take 1 tablet (10 mg total) by mouth daily. 90 tablet 3  . furosemide  (LASIX) 80 MG tablet Take 1 tablet (80 mg total) by mouth daily. (Patient taking differently: Take 80 mg by mouth daily. Pt takes it sometimes) 90 tablet 3  . glipiZIDE (GLUCOTROL XL) 10 MG 24 hr tablet Take 1 tablet (10 mg total) by mouth daily with breakfast. (Patient taking differently: Take 10 mg by mouth every evening. ) 90 tablet 3  . LORazepam (ATIVAN) 1 MG tablet Take 1 tablet (1 mg total) by mouth at bedtime. 30 tablet 0  . losartan (COZAAR) 100 MG tablet Take 1 tablet (100 mg total) by mouth daily. 90 tablet 3  . metFORMIN (GLUCOPHAGE) 1000 MG tablet Take 1 tablet (1,000 mg total) by mouth 2 (two) times daily with a meal. (Patient taking differently: Take 500 mg by mouth 2 (two) times daily with a meal. ) 180 tablet 3  . potassium chloride (K-DUR) 10 MEQ tablet Take 1 tablet (10 mEq total) by mouth 2 (two) times daily. (Patient taking differently: Take 10 mEq by mouth daily. ) 60 tablet 3  . risperiDONE (RISPERDAL) 2 MG tablet Take 2 tablets (4 mg total) by mouth daily. 60 tablet 3  . carvedilol (COREG) 3.125 MG tablet Take 1 tablet (3.125 mg total) by mouth 2 (two) times daily. 180 tablet 3  . aspirin EC 81 MG tablet Take 1 tablet (81 mg total) by mouth daily. (Patient not taking: Reported on 08/06/2016)    . nitroGLYCERIN (NITROSTAT) 0.4 MG SL tablet Place 1 tablet (0.4 mg total) under the tongue every 5 (five) minutes as needed for chest pain. Do not take more than 3 pills in one episode 25 tablet 6  . oxybutynin (DITROPAN XL) 15 MG 24 hr tablet Take 1 tablet (15 mg total) by mouth at bedtime. 30 tablet 3  . pantoprazole (PROTONIX) 40 MG tablet Take 1 tablet (40 mg total) by mouth daily. (Patient not taking: Reported on 12/03/2016) 90 tablet 3   No facility-administered medications prior to visit.      Allergies:   Acetaminophen; Caffeine; and Lisinopril   Social History   Social History  . Marital status: Single    Spouse name: N/A  . Number of children: 0  . Years of education: 34    Occupational History  . unemployed    Social History Main Topics  . Smoking status: Never Smoker  . Smokeless tobacco: Never Used  . Alcohol use No  . Drug use: No  . Sexual activity: Not Currently    Birth control/ protection: None   Other Topics Concern  . 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 But ran out of money and is no longer attending school. (Note patient has bipolar disorder).      Born in [place] but lived in [place] before coming back to [place].       Primary language is Arabic. Lives with mother and brother.  Family History:  The patient's family history includes Heart disease in her maternal grandfather and paternal grandfather; Heart failure in her father; Hypertension in her mother; Stroke in her father.   ROS:   Please see the history of present illness.    ROS All other systems reviewed and are negative.   PHYSICAL EXAM:   VS:  BP (!) 195/107   Pulse 91   Ht 5\' 7"  (1.702 m)   Wt (!) 348 lb 9.6 oz (158.1 kg)   LMP 12/08/2016   SpO2 93%   BMI 54.60 kg/m    GEN: Well nourished, well developed, in no acute distress  HEENT: normal  Neck: no JVD, carotid bruits, or masses Cardiac: RRR; no murmurs, rubs, or gallops. 1+ pitting edema in bilateral LE Respiratory:  clear to auscultation bilaterally, normal work of breathing GI: soft, nontender, nondistended, + BS MS: no deformity or atrophy  Skin: warm and dry, no rash Neuro:  Alert and Oriented x 3, Strength and sensation are intact Psych: euthymic mood, full affect  Wt Readings from Last 3 Encounters:  12/16/16 (!) 348 lb 9.6 oz (158.1 kg)  12/03/16 (!) 334 lb (151.5 kg)  08/06/16 (!) 337 lb 9.6 oz (153.1 kg)      Studies/Labs Reviewed:   EKG:  EKG is not ordered today.   Recent Labs: 08/06/2016: ALT 6 12/03/2016: B Natriuretic Peptide 204.5; BUN 11; Creatinine, Ser 0.54;  Hemoglobin 9.4; Platelets 300; Potassium 4.1; Sodium 136   Lipid Panel    Component Value Date/Time   CHOL 173 08/06/2016 1245   TRIG 212 (H) 08/06/2016 1245   HDL 45 (L) 08/06/2016 1245   CHOLHDL 3.8 08/06/2016 1245   VLDL 42 (H) 08/06/2016 1245   LDLCALC 86 08/06/2016 1245    Additional studies/ records that were reviewed today include:   Echo 10/04/2015 LV EF: 60% -   65%  ------------------------------------------------------------------- Indications:      CHF - 428.0.  ------------------------------------------------------------------- History:   Risk factors:  Morbidly obese.  ------------------------------------------------------------------- Study Conclusions  - Left ventricle: The cavity size was normal. Wall thickness was   increased in a pattern of moderate LVH. Systolic function was   normal. The estimated ejection fraction was in the range of 60%   to 65%. Wall motion was normal; there were no regional wall   motion abnormalities. Doppler parameters are consistent with   elevated ventricular end-diastolic filling pressure. - Mitral valve: Calcified annulus. Valve area by continuity   equation (using LVOT flow): 2.62 cm^2. - Left atrium: The atrium was moderately dilated. - Atrial septum: No defect or patent foramen ovale was identified. - Pulmonary arteries: PA peak pressure: 51 mm Hg (S).   ASSESSMENT:    1. Acute on chronic diastolic congestive heart failure ([place])   2. Essential hypertension   3. Chronic chest pain   4. Controlled type 2 diabetes mellitus with complication, without long-term current use of insulin ([place])   5. Morbid obesity ([place])      PLAN:  In order of problems listed above:  1. Acute on chronic diastolic heart failure  - She has not been compliant with her Lasix, she has gained roughly 10 pounds in the last 4 months, I have restarted Lasix at 80 mg daily. 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 She will need to obtain a basic metabolic panel in 5 days. I  plan to see her back in 3 weeks for reassessment.  2. Chronic chest pain with exertion  - She has chronic chest pain with exertion for the past year, her main issue today seems to be dyspnea on exertion which is likely related to her volume overload and morbid obesity. I have discussed with her potential workup, she had a previous negative Myoview, she continues to refuse today for any heart catheterization. Recent EKG has been reviewed which did not show any significant ST-T wave changes  3. Hypertension: Blood pressure is elevated, she was started on carvedilol during the last office visit. Spine she says she's been compliant with blood pressure medication, high blood pressure remains elevated today, I will increase carvedilol to 6.25 mg twice a day. And will continue to uptitrate during the next office visit.  4. DM 2: She is on glipizide and metformin.  5. Morbid obesity: This likely is the main factor that's responsible for most of her symptoms. If she is able to lose weight, that can potentially help with her other issue as well. She seems to be quite afraid of doing cardiac catheterization, I don't know if she would be amenable for bariatric surgery.    Medication Adjustments/Labs and Tests Ordered: Current medicines are reviewed at length with the patient today.  Concerns regarding medicines are outlined above.  Medication changes, Labs and Tests ordered today are listed in the Patient Instructions below. Patient Instructions  Your physician has recommended you make the following change in your medication:  START FUROSEMIDE 80 MG  DAILY INCREASE CARVEDILOL TO 6.25 MG TWICE  DAILY   Your physician recommends that you return for lab work in:  [place] [place]  [place] recommends that you schedule a follow-up appointment in:  [place]  NP     Signed, Almyra Deforest, [place]  12/16/2016 [address], [place], [place]  [address] Phone: [phone]; Fax: [phone]

## 2017-01-01 MED FILL — CARVEDILOL 6.25 MG TABLET: 6.25 | 90 days supply | Qty: 180 | Fill #0

## 2017-01-01 MED FILL — OXYBUTYNIN CL ER 15 MG TAB: 15 | 30 days supply | Qty: 30 | Fill #2

## 2017-01-01 MED FILL — AMLODIPINE BESYLATE 10 MG T: 10 | 30 days supply | Qty: 30 | Fill #2

## 2017-01-01 MED FILL — glipiZIDE XL 10 MG TB24: 10 | 30 days supply | Qty: 30 | Fill #2

## 2017-01-01 MED FILL — risperiDONE 2 MG TABS: 2 | 30 days supply | Qty: 60 | Fill #2

## 2017-01-01 MED FILL — ?FUROSEMIDE 80MG TABLET: 80 | 30 days supply | Qty: 30 | Fill #2

## 2017-01-01 MED FILL — LOSARTAN POTASSIUM 100 MG T: 100 | 30 days supply | Qty: 30 | Fill #2

## 2017-01-06 ENCOUNTER — Ambulatory Visit: Payer: No Typology Code available for payment source

## 2017-01-06 ENCOUNTER — Ambulatory Visit (INDEPENDENT_AMBULATORY_CARE_PROVIDER_SITE_OTHER): Payer: No Typology Code available for payment source | Admitting: Physician Assistant

## 2017-01-06 ENCOUNTER — Encounter: Payer: Self-pay | Admitting: Physician Assistant

## 2017-01-06 ENCOUNTER — Other Ambulatory Visit: Payer: Self-pay | Admitting: Internal Medicine

## 2017-01-06 VITALS — BP 150/89 | HR 94 | Ht 67.0 in | Wt 335.6 lb

## 2017-01-06 DIAGNOSIS — G8929 Other chronic pain: Secondary | ICD-10-CM

## 2017-01-06 DIAGNOSIS — E119 Type 2 diabetes mellitus without complications: Secondary | ICD-10-CM

## 2017-01-06 DIAGNOSIS — R079 Chest pain, unspecified: Secondary | ICD-10-CM

## 2017-01-06 DIAGNOSIS — I10 Essential (primary) hypertension: Secondary | ICD-10-CM

## 2017-01-06 DIAGNOSIS — I5032 Chronic diastolic (congestive) heart failure: Secondary | ICD-10-CM

## 2017-01-06 DIAGNOSIS — IMO0001 Reserved for inherently not codable concepts without codable children: Secondary | ICD-10-CM

## 2017-01-06 DIAGNOSIS — Z794 Long term (current) use of insulin: Principal | ICD-10-CM

## 2017-01-06 MED ORDER — CARVEDILOL 12.5 MG PO TABS: 12.5000 mg | ORAL_TABLET | Freq: Two times a day (BID) | ORAL | 3 refills | Status: DC

## 2017-01-06 NOTE — Progress Notes (Signed)
Cardiology Office Note    Date:  01/07/2017   ID:  Audrielle, Vankuren 03-08-1966, MRN [id number]  PCP:  [name], MD  Cardiologist:  Dr. [name]  Chief Complaint  Patient presents with  . Shortness of Breath    pt states she still has SOB   . Edema    both feet   . Follow-up    seen for Dr. [name]    History of Present Illness:  Kierstyn Baranowski is a 51 y.o. female with PMH of chronic chest pain, diastolic heart failure, hypertensive heart disease with history of hypertensive urgency, DM II, obstructive sleep apnea and abnormal chest CT with mediastinal adenopathy followed by pulmonology. She had normal Myoview in 2012. She later presented with recurrent chest pain with recommendation for diagnostic cardiac catheterization, however she declined. She has been treated medically since. She was here evaluated by pulmonology in August 2017 secondary to history of abnormal chest CT with mediastinal adenopathy, basilar groundglass infiltrates, and prior Mycobacterium avium infection. She was last seen on 06/16/2016..   More recently, patient was seen in the emergency room on 12/03/2016 with 5 day history of chest pain and shortness of breath. She was not compliant with her Lasix and did have small amount of lower extremity edema. Her BNP was only 204.5. Troponin negative. She has chronic anemia with hemoglobin at stable level. Chest x-ray suspected acute pulmonary edema, however no pleural effusion. EKG showed normal significant ST-T wave changes. More recently, she has had a CT chest without contrast on 12/15/2016, this showed grossly stable mediastinal and bilateral hilar adenopathy, low lung volume with mild nonspecific groundglass attenuation, no acute thoracic process.  I last saw the patient on 12/16/2016, at which time she continued to have exertional shortness of breath and a very mild exertional chest discomfort as well. She continued to refuse cardiac catheterization. She says she  was afraid to do it. She also gained roughly 10 pounds since November. When asked about her diuretic, she says she has not been taking the diuretic as it was interfering with his school work. He is going to school as a Psychologist, sport and exercise 3 times a week. I did ask her to reinitiate her diuretic, on the day of her school, she can take the diuretic at a later time after 2 p.m. when she finish class. I also recommended one week base metabolic panel and if 3 weeks follow-up for her symptom, however it does not appear one week base metabolic panel was obtained. Furthermore, and I also increased carvedilol to 6.25 mg twice a day.  She presents today for follow-up, she has lost 10 pounds after reinitiating diuretic. I will obtain a basic metabolic panel. She continued to refuse cardiac catheterization despite having recurrent chest discomfort at rest and with exertion. She understand that if chest discomfort worsens she will need to seek urgent medical attention. On physical exam, her lower extremity edema has significantly improved. Her blood pressure continued to be elevated, I will increase carvedilol to 12.5 mg twice a day, however she says she does not believe her systolic blood pressure would drop below 145.    Past Medical History:  Diagnosis Date  . Bipolar disease, chronic ([place])   . Chest pain    a. 2012 Myoview: EF 63%, no isch/infarct;  b. 04/2016 Lexiscan MV: EF 73%, no ischemia/infarct-->Low risk.  . CHF (congestive heart failure) ([place])   . Chronic diastolic CHF (congestive heart failure) ([place])    a. 2015 Echo: EF  55-60%, Gr2 DD;  b. 09/2015 Echo: EF 60-65%, no rwma, mod dil LA, PASP 72mmHg.  Marland Kitchen History of thyrotoxicosis   . Hypertensive heart disease 10/18/2013  . Insulin dependent type 2 diabetes mellitus, uncontrolled ([place])   . Mediastinal adenopathy   . MI (myocardial infarction)   . Morbid obesity with BMI of 50.0-59.9, adult ([place])   . OSA (obstructive sleep apnea) 03/06/2011    No past  surgical history on file.  Current Medications: Outpatient Medications Prior to Visit  Medication Sig Dispense Refill  . albuterol (PROVENTIL HFA;VENTOLIN HFA) 108 (90 Base) MCG/ACT inhaler Inhale 1-2 puffs into the lungs every 6 (six) hours as needed for wheezing or shortness of breath. 1 Inhaler 0  . amLODipine (NORVASC) 10 MG tablet Take 1 tablet (10 mg total) by mouth daily. 90 tablet 3  . furosemide (LASIX) 80 MG tablet Take 1 tablet (80 mg total) by mouth daily. (Patient taking differently: Take 80 mg by mouth daily. Pt takes it sometimes) 90 tablet 3  . glipiZIDE (GLUCOTROL XL) 10 MG 24 hr tablet Take 1 tablet (10 mg total) by mouth daily with breakfast. (Patient taking differently: Take 10 mg by mouth every evening. ) 90 tablet 3  . LORazepam (ATIVAN) 1 MG tablet Take 1 tablet (1 mg total) by mouth at bedtime. 30 tablet 0  . losartan (COZAAR) 100 MG tablet Take 1 tablet (100 mg total) by mouth daily. 90 tablet 3  . metFORMIN (GLUCOPHAGE) 1000 MG tablet Take 1 tablet (1,000 mg total) by mouth 2 (two) times daily with a meal. (Patient taking differently: Take 500 mg by mouth 2 (two) times daily with a meal. ) 180 tablet 3  . nitroGLYCERIN (NITROSTAT) 0.4 MG SL tablet Place 0.4 mg under the tongue every 5 (five) minutes as needed for chest pain.    . potassium chloride (K-DUR) 10 MEQ tablet Take 1 tablet (10 mEq total) by mouth 2 (two) times daily. (Patient taking differently: Take 10 mEq by mouth daily. ) 60 tablet 3  . risperiDONE (RISPERDAL) 2 MG tablet Take 2 tablets (4 mg total) by mouth daily. 60 tablet 3  . carvedilol (COREG) 6.25 MG tablet Take 1 tablet (6.25 mg total) by mouth 2 (two) times daily. 180 tablet 3   No facility-administered medications prior to visit.      Allergies:   Acetaminophen; Caffeine; and Lisinopril   Social History   Social History  . Marital status: Single    Spouse name: N/A  . Number of children: 0  . Years of education: 81   Occupational History    . unemployed    Social History Main Topics  . Smoking status: Never Smoker  . Smokeless tobacco: Never Used  . Alcohol use No  . Drug use: No  . Sexual activity: Not Currently    Birth control/ protection: None   Other Topics Concern  . 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 But ran out of money and is no longer attending school. (Note patient has bipolar disorder).      Born in [place] but lived in [place] before coming back to [place].       Primary language is Arabic. Lives with mother and brother.                 Family History:  The patient's family history includes  Heart disease in her maternal grandfather and paternal grandfather; Heart failure in her father; Hypertension in her mother; Stroke in her father.   ROS:   Please see the history of present illness.    ROS All other systems reviewed and are negative.   PHYSICAL EXAM:   VS:  BP (!) 150/89   Pulse 94   Ht 5\' 7"  (1.702 m)   Wt (!) 335 lb 9.6 oz (152.2 kg)   LMP 12/08/2016   SpO2 96%   BMI 52.56 kg/m    GEN: Well nourished, well developed, in no acute distress  HEENT: normal  Neck: no JVD, carotid bruits, or masses Cardiac: RRR; no murmurs, rubs, or gallops,no edema  Respiratory:  clear to auscultation bilaterally, normal work of breathing GI: soft, nontender, nondistended, + BS MS: no deformity or atrophy  Skin: warm and dry, no rash Neuro:  Alert and Oriented x 3, Strength and sensation are intact Psych: euthymic mood, full affect  Wt Readings from Last 3 Encounters:  01/06/17 (!) 335 lb 9.6 oz (152.2 kg)  12/16/16 (!) 348 lb 9.6 oz (158.1 kg)  12/03/16 (!) 334 lb (151.5 kg)      Studies/Labs Reviewed:   EKG:  EKG is not ordered today.   Recent Labs: 08/06/2016: ALT 6 12/03/2016: B Natriuretic Peptide 204.5; BUN 11; Creatinine, Ser 0.54; Hemoglobin 9.4; Platelets 300; Potassium 4.1;  Sodium 136   Lipid Panel    Component Value Date/Time   CHOL 173 08/06/2016 1245   TRIG 212 (H) 08/06/2016 1245   HDL 45 (L) 08/06/2016 1245   CHOLHDL 3.8 08/06/2016 1245   VLDL 42 (H) 08/06/2016 1245   LDLCALC 86 08/06/2016 1245    Additional studies/ records that were reviewed today include:   Echo 10/04/2015 LV EF: 60% - 65%  ------------------------------------------------------------------- Indications: CHF - 428.0.  ------------------------------------------------------------------- History: Risk factors: Morbidly obese.  ------------------------------------------------------------------- Study Conclusions  - Left ventricle: The cavity size was normal. Wall thickness was increased in a pattern of moderate LVH. Systolic function was normal. The estimated ejection fraction was in the range of 60% to 65%. Wall motion was normal; there were no regional wall motion abnormalities. Doppler parameters are consistent with elevated ventricular end-diastolic filling pressure. - Mitral valve: Calcified annulus. Valve area by continuity equation (using LVOT flow): 2.62 cm^2. - Left atrium: The atrium was moderately dilated. - Atrial septum: No defect or patent foramen ovale was identified. - Pulmonary arteries: PA peak pressure: 51 mm Hg (S).   ASSESSMENT:    1. Chronic diastolic heart failure ([place])   2. Essential hypertension   3. Chronic chest pain   4. Controlled type 2 diabetes mellitus without complication, without long-term current use of insulin ([place])   5. Morbid obesity ([place])      PLAN:  In order of problems listed above:  1. Chronic diastolic heart failure: Lower extremity edema has significantly improved. She has lost roughly 10 pounds since our last office visit. She was supposed to obtain a basic metabolic panel 2 weeks ago after starting on diuretic for week, this was not done. We'll continue the current dose of diuretic and obtain  basic metabolic panel today.   2. Hypertension: Blood pressure continued to be elevated, we'll increase Coreg to 12.5 mg twice a day.  3. Chronic chest pain: Continue to have exertional type chest pain and also chest pain arrest, she has refused cardiac catheterization due to multiple office visits. She understands she will need to seek urgent medical  attention if her symptom worsens. She had a negative Myoview in August 2017  4. DM 2: Metformin and glipizide, managed by primary care physician.  5. Morbid obesity: If she came loose some weight, I think that will help with her other issues as well. She likely would not pursue bariatric surgery since she already afraid to undergo cardiac catheterization.    Medication Adjustments/Labs and Tests Ordered: Current medicines are reviewed at length with the patient today.  Concerns regarding medicines are outlined above.  Medication changes, Labs and Tests ordered today are listed in the Patient Instructions below. Patient Instructions  Your physician has recommended you make the following change in your medication:  -- INCREASE carvedilol (coreg) to 12.5mg  twice daily  Your physician recommends that you return for lab work [place] ([facility])  If recurrent chest pain, you will need to go to [facility] for evaluation   Your physician recommends that you schedule a follow-up appointment in: [place] with Dr. [name]       Signed, [place], [place]  01/07/2017 [address], [place], [place]  [address] Phone: [phone]; Fax: [phone]

## 2017-01-06 NOTE — Patient Instructions (Signed)
Your physician has recommended you make the following change in your medication:  -- INCREASE carvedilol (coreg) to 12.5mg  twice daily  Your physician recommends that you return for lab work TODAY (BMET)  If recurrent chest pain, you will need to go to [facility] for evaluation   Your physician recommends that you schedule a follow-up appointment in: [place] with Dr. [name]

## 2017-01-07 ENCOUNTER — Encounter: Payer: Self-pay | Admitting: Physician Assistant

## 2017-01-07 ENCOUNTER — Other Ambulatory Visit: Payer: Self-pay | Admitting: Internal Medicine

## 2017-01-07 ENCOUNTER — Ambulatory Visit: Payer: No Typology Code available for payment source

## 2017-01-07 MED FILL — CARVEDILOL 12.5 MG TABLET: 12.5 | 30 days supply | Qty: 60 | Fill #0

## 2017-01-07 MED FILL — TRUE METRIX TEST STRIP: 30 days supply | Qty: 100 | Fill #0

## 2017-01-18 ENCOUNTER — Ambulatory Visit (INDEPENDENT_AMBULATORY_CARE_PROVIDER_SITE_OTHER): Payer: No Typology Code available for payment source | Admitting: Emergency Medicine

## 2017-01-18 ENCOUNTER — Encounter: Payer: Self-pay | Admitting: Emergency Medicine

## 2017-01-18 DIAGNOSIS — R590 Localized enlarged lymph nodes: Secondary | ICD-10-CM

## 2017-01-18 DIAGNOSIS — G4733 Obstructive sleep apnea (adult) (pediatric): Secondary | ICD-10-CM

## 2017-01-18 DIAGNOSIS — A310 Pulmonary mycobacterial infection: Secondary | ICD-10-CM

## 2017-01-18 NOTE — Assessment & Plan Note (Signed)
Unclear at this time that we need to treat. Deferring for now

## 2017-01-18 NOTE — Patient Instructions (Addendum)
Walking oximetry today on room air to see if you qualify for oxygen We will obtain your CPAP mask from [facility] Your CT scan of the chest does not show any changes compared with your prior. We do not need to start any new medications for this at this time.  Follow with Dr Lamonte Sakai in 3 months or sooner if you have any problems.

## 2017-01-18 NOTE — Assessment & Plan Note (Signed)
She never got her CPAp after last visit - needs CPAP 19. Will try again to get from [facility].

## 2017-01-18 NOTE — Assessment & Plan Note (Signed)
Stable by most recent Ct chest. Unclear that we need to biopsy at this time. I would like to concentrate on treating her more pressing problems including her hypoxemia at night. Her possible hypoxemia during the day. Will follow w serial imaging

## 2017-01-18 NOTE — Progress Notes (Signed)
Subjective:    Patient ID: [name], female    DOB: 1966/04/16, 51 y.o.   MRN: [id number]  Shortness of Breath  Associated symptoms include rhinorrhea.   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 Patient has significant comorbidities which include untreated sleep apnea, diastolic heart failure. Recent respiratory infection treated with Levaquin in January 2017. Chest CTA scan showed no filling defects, poor inspiratory effort, enlargement of 2R and 7 mediastinal nodes, very subtle base predominant bilateral patchy groundglass like infiltrates, ? airtrapping. She had a chest CT scan in 2012 which looks similar but the lymph nodes were not as prominent. Her quant gold was negative, ACE normal, HIV negative. Her sputum cx from hospitalization > [facility]. She was to have a repeat Ct chest to further eval her infiltrates but this has not happened yet.   She is having daily cough, can wake her from sleep. Prod of green to yellow, she is having allergy sx. She is on an anti-histamine, ? Which one. Not taking her nasal steroid currently. Her breathing is stable - she does have some exertional dyspnea.    She has a CPAP but is not using it reliably. PSG on 3/5 states that she she needs CPAP therapy on 19 cm H2O with a Medium size Resmed Full Face Mask AirFit F20 mask    ROV 05/05/16 -- This is a follow-up visit for patient with obstructive sleep apnea determined to need CPAP 19 cmH2O. Also with a history of Mediastinal lymphadenopathy and some basilar groundglass infiltrate. Sputum culture from 2/17 was positive for Myco bacterium avium.  I performed a repeat CT scan of the chest on 02/03/16 that showed improvement in some diffuse groundglass infiltrate, no evidence of interstitial lung disease or bronchiectasis and some stable mild to moderate bilateral hilar adenopathy. She has  a CPAP machine with her today that she says does not work. She also states that she has difficulty tolerating full face mask. She did hear from [facility], but she not ever go to see them to get new pressure or a new mask. She says that she has some exertional mid-sternal CP. She has been having this since February, 2017.  She has been recommended to have a L heart cath in the past but I believe she refused it.   ROV 08/05/16 -- this is a follow-up visit for obstructive sleep apnea, mediastinal lymphadenopathy on CT scan of the chest, hypertension, and a prior asymptomatic culture positive for [facility] 11/13/15. She has seen cardiology for blood pressure control and also had a myocardial perfusion stress test on 05/21/16 that showed no evidence to support coronary disease. Today her BP is 160/102. It was recommended that she start CPAP at 19 cm water - she says that she was never contacted to get this. She denies any cough or SOB. Her CP has gradually improved. She is able to walk every day.   Needs CPAP 19 cm H2O with a Medium size Resmed Full Face Mask AirFit F20 mask and heated humidification  ROV 01/18/17 -- this follow-up visit for patient with a history of OSA, mediastinal lymphadenopathy on CT scan of the chest, culture positive for Mycobacterium avium (not treated). She has systemic and artery hypertension. She has been seen since our last visit for chest discomfort - cardiac catheterization has been recommended but she had said not to do this. She  did have a negative stress test in August 2017. The chest heaviness is associated with dyspnea. She does not have her CPAP mask - I ordered it in November but she says that she never got it from [facility]. She underwent Ct chest 12/25/16 that I have reviewed >> no real change in mediastinal LAD, ? Some dependent atx, ? Some subtle GG changes, all stable.     Review of Systems  HENT: Positive for postnasal drip and rhinorrhea.   Respiratory: Negative for cough, chest  tightness and shortness of breath.   Psychiatric/Behavioral: Positive for sleep disturbance.   As per HPI     Objective:   Physical Exam Vitals:   01/18/17 1114  BP: (!) 170/70  Pulse: 92  SpO2: 98%  Weight: (!) 331 lb (150.1 kg)  Height: _0  (1.702 m)    Gen: Pleasant, obese woman, in no distress,  normal affect  ENT: No lesions,  mouth clear,  oropharynx clear, no postnasal drip  Neck: No JVD, no TMG, no carotid bruits  Lungs: No use of accessory muscles, small breaths, no crackles or rhonchi  Cardiovascular: RRR, heart sounds normal, no murmur or gallops, trace peripheral edema  Musculoskeletal: No deformities, no cyanosis or clubbing  Neuro: alert, non focal  Skin: Warm, no lesions or rashes      Assessment & Plan:  Mediastinal adenopathy Stable by most recent Ct chest. Unclear that we need to biopsy at this time. I would like to concentrate on treating her more pressing problems including her hypoxemia at night. Her possible hypoxemia during the day. Will follow w serial imaging  OSA (obstructive sleep apnea)- non compliant with C-pap She never got her CPAp after last visit - needs CPAP 19. Will try again to get from [facility]   Mycobacterium avium complex ([place]) Unclear at this time that we need to treat. Deferring for now   Baltazar Apo, MD, PhD 01/18/2017, 11:52 AM [facility] [phone] or if no answer [phone]

## 2017-03-08 ENCOUNTER — Other Ambulatory Visit: Payer: Self-pay | Admitting: Internal Medicine

## 2017-03-08 DIAGNOSIS — F3162 Bipolar disorder, current episode mixed, moderate: Secondary | ICD-10-CM

## 2017-03-08 DIAGNOSIS — N393 Stress incontinence (female) (male): Secondary | ICD-10-CM

## 2017-03-08 MED FILL — glipiZIDE ER 10 MG TB24: 10 | 30 days supply | Qty: 30 | Fill #3

## 2017-03-08 MED FILL — ?RISPERIDONE 2 MG TABLET: 2 | 30 days supply | Qty: 60 | Fill #3

## 2017-03-08 MED FILL — AMLODIPINE BESYLATE 10 MG T: 10 | 30 days supply | Qty: 30 | Fill #3

## 2017-03-08 MED FILL — TRUE METRIX TEST STRIP: 30 days supply | Qty: 100 | Fill #1

## 2017-03-08 MED FILL — CARVEDILOL 12.5 MG TABLET: 12.5 | 30 days supply | Qty: 60 | Fill #1

## 2017-03-08 MED FILL — OXYBUTYNIN CL ER 15 MG TAB: 15 | 30 days supply | Qty: 30 | Fill #3

## 2017-03-08 MED FILL — LOSARTAN POTASSIUM 100 MG T: 100 | 30 days supply | Qty: 30 | Fill #3

## 2017-03-08 MED FILL — ?FUROSEMIDE 80MG TABLET: 80 | 30 days supply | Qty: 30 | Fill #3

## 2017-03-24 ENCOUNTER — Ambulatory Visit: Payer: Self-pay | Attending: Internal Medicine | Admitting: Internal Medicine

## 2017-03-24 ENCOUNTER — Encounter: Payer: Self-pay | Admitting: Internal Medicine

## 2017-03-24 VITALS — BP 114/64 | HR 81 | Temp 98.3°F | Resp 18 | Ht 67.0 in | Wt 324.0 lb

## 2017-03-24 DIAGNOSIS — I252 Old myocardial infarction: Secondary | ICD-10-CM | POA: Insufficient documentation

## 2017-03-24 DIAGNOSIS — I5032 Chronic diastolic (congestive) heart failure: Secondary | ICD-10-CM

## 2017-03-24 DIAGNOSIS — Z91018 Allergy to other foods: Secondary | ICD-10-CM | POA: Insufficient documentation

## 2017-03-24 DIAGNOSIS — I110 Hypertensive heart disease with heart failure: Secondary | ICD-10-CM | POA: Insufficient documentation

## 2017-03-24 DIAGNOSIS — Z886 Allergy status to analgesic agent status: Secondary | ICD-10-CM | POA: Insufficient documentation

## 2017-03-24 DIAGNOSIS — G4733 Obstructive sleep apnea (adult) (pediatric): Secondary | ICD-10-CM | POA: Insufficient documentation

## 2017-03-24 DIAGNOSIS — Z888 Allergy status to other drugs, medicaments and biological substances status: Secondary | ICD-10-CM | POA: Insufficient documentation

## 2017-03-24 DIAGNOSIS — F319 Bipolar disorder, unspecified: Secondary | ICD-10-CM | POA: Insufficient documentation

## 2017-03-24 DIAGNOSIS — E669 Obesity, unspecified: Secondary | ICD-10-CM | POA: Insufficient documentation

## 2017-03-24 DIAGNOSIS — E119 Type 2 diabetes mellitus without complications: Secondary | ICD-10-CM

## 2017-03-24 DIAGNOSIS — Z6841 Body Mass Index (BMI) 40.0 and over, adult: Secondary | ICD-10-CM | POA: Insufficient documentation

## 2017-03-24 DIAGNOSIS — I10 Essential (primary) hypertension: Secondary | ICD-10-CM

## 2017-03-24 DIAGNOSIS — Z794 Long term (current) use of insulin: Secondary | ICD-10-CM

## 2017-03-24 LAB — GLUCOSE, POCT (MANUAL RESULT ENTRY): POC Glucose: 366 mg/dl — AB (ref 70–99)

## 2017-03-24 LAB — POCT GLYCOSYLATED HEMOGLOBIN (HGB A1C): Hemoglobin A1C: 10.2

## 2017-03-24 MED ORDER — DAPAGLIFLOZIN PROPANEDIOL 10 MG PO TABS
10.0000 mg | ORAL_TABLET | Freq: Every day | ORAL | 3 refills | Status: DC
Start: 2017-03-24 — End: 2017-08-03

## 2017-03-24 MED ORDER — METFORMIN HCL 1000 MG PO TABS: 1000.0000 mg | ORAL_TABLET | Freq: Two times a day (BID) | ORAL | 3 refills | Status: DC

## 2017-03-24 MED ORDER — AMLODIPINE BESYLATE 10 MG PO TABS: 10.0000 mg | ORAL_TABLET | Freq: Every day | ORAL | 3 refills | Status: DC

## 2017-03-24 MED ORDER — FUROSEMIDE 80 MG PO TABS: 80.0000 mg | ORAL_TABLET | Freq: Every day | ORAL | 3 refills | Status: DC

## 2017-03-24 MED ORDER — LOSARTAN POTASSIUM 100 MG PO TABS: 100.0000 mg | ORAL_TABLET | Freq: Every day | ORAL | 3 refills | Status: DC

## 2017-03-24 MED ORDER — CARVEDILOL 12.5 MG PO TABS: 12.5000 mg | ORAL_TABLET | Freq: Two times a day (BID) | ORAL | 3 refills | Status: DC

## 2017-03-24 MED ORDER — GLIPIZIDE ER 10 MG PO TB24: 10.0000 mg | ORAL_TABLET | Freq: Every day | ORAL | 3 refills | Status: DC

## 2017-03-24 MED FILL — ?AMLODIPINE BESYLATE 10 MG: 10 | 30 days supply | Qty: 30 | Fill #0

## 2017-03-24 MED FILL — ?METFORMIN HCL 1,000 MG TAB: 1000 | 30 days supply | Qty: 60 | Fill #0

## 2017-03-24 MED FILL — !FARXIGA 10MG TABLET: 10 | 30 days supply | Qty: 30 | Fill #0

## 2017-03-24 NOTE — Progress Notes (Signed)
[name], is a 51 y.o. female  AOZ:[id number]  NGE:[id number]  DOB - [birth year]  Chief Complaint  Patient presents with  . Diabetes      Subjective:   [name] is a 51 y.o. female with history of type 2 diabetes mellitus uncontrolled, chronic diastolic heart failure, morbid obesity, bipolar disorder, obstructive sleep apnea (refuses to use CPAP) and hypertension presents here today for a routine diabetes follow up visit. Patient is in very good spirit today, almost done with her Psychologist, sport and exercise course at [place], just left with one clinical rotation in an outpatient clinic. She has no new complaint today. Her blood sugar is uncontrolled with the current regimen even though she claims adherence. She does not exercise and showed indiscretion with her diet. She continues to drink lots of soda and craves sweets including cake and cookies. She said she has had Patient has No headache, No chest pain, No abdominal pain - No Nausea, No new weakness tingling or numbness. She needs refills on all her medications. Has follow up appointments with pulmonologist and cardiologist coming up.   Patient has history of Mediastinal lymphadenopathy and some basilar groundglass infiltrate. Sputum culture from 2/17 was positive for Myco bacterium avium. A repeat CT scan of the chest on 02/03/16 showed improvement in some diffuse groundglass infiltrate, no evidence of interstitial lung disease or bronchiectasis and some stable mild to moderate bilateral hilar adenopathy. She has been having occasional chest pain since February, 2017. She has been recommended to have a L heart cath in the past but she refused it.    Problem  Chronic Diastolic Heart Failure (Hcc)  Morbid Obesity Due to Excess Calories (Hcc)   ALLERGIES: Allergies  Allergen Reactions  . Acetaminophen     Fits as a child "seizures-like"  . Caffeine     Tense, anxiety, increased urination  . Lisinopril Rash    Rash with lisinopril;  but fosinopril is ok per patient   PAST MEDICAL HISTORY: Past Medical History:  Diagnosis Date  . Bipolar disease, chronic ([place])   . Chest pain    a. 2012 Myoview: EF 63%, no isch/infarct;  b. 04/2016 Lexiscan MV: EF 73%, no ischemia/infarct-->Low risk.  . CHF (congestive heart failure) ([place])   . Chronic diastolic CHF (congestive heart failure) ([place])    a. 2015 Echo: EF 55-60%, Gr2 DD;  b. 09/2015 Echo: EF 60-65%, no rwma, mod dil LA, PASP 51mmHg.  Marland Kitchen History of thyrotoxicosis   . Hypertensive heart disease 10/18/2013  . Insulin dependent type 2 diabetes mellitus, uncontrolled ([place])   . Mediastinal adenopathy   . MI (myocardial infarction) ([place])   . Morbid obesity with BMI of 50.0-59.9, adult ([place])   . OSA (obstructive sleep apnea) 03/06/2011    MEDICATIONS AT HOME: Prior to Admission medications   Medication Sig Start Date End Date Taking? Authorizing Provider  amLODipine (NORVASC) 10 MG tablet Take 1 tablet (10 mg total) by mouth daily. 03/24/17  Yes Tresa Garter, MD  carvedilol (COREG) 12.5 MG tablet Take 1 tablet (12.5 mg total) by mouth 2 (two) times daily. 03/24/17  Yes Tresa Garter, MD  furosemide (LASIX) 80 MG tablet Take 1 tablet (80 mg total) by mouth daily. Pt takes it sometimes 03/24/17  Yes Ulah Olmo E, MD  glipiZIDE (GLUCOTROL XL) 10 MG 24 hr tablet Take 1 tablet (10 mg total) by mouth daily with breakfast. 03/24/17  Yes Markelle Najarian E, MD  glucose blood test strip Use as instructed  01/06/17  Yes Delphia Kaylor, Marlena Clipper, MD  LORazepam (ATIVAN) 1 MG tablet Take 1 tablet (1 mg total) by mouth at bedtime. 07/02/14  Yes Tresa Garter, MD  losartan (COZAAR) 100 MG tablet Take 1 tablet (100 mg total) by mouth daily. 03/24/17  Yes Tresa Garter, MD  metFORMIN (GLUCOPHAGE) 1000 MG tablet Take 1 tablet (1,000 mg total) by mouth 2 (two) times daily with a meal. 03/24/17  Yes Khaleef Ruby E, MD  nitroGLYCERIN (NITROSTAT) 0.4 MG SL tablet Place 0.4  mg under the tongue every 5 (five) minutes as needed for chest pain.   Yes [provider]  oxybutynin (DITROPAN XL) 15 MG 24 hr tablet Take 1 tablet by mouth daily. 01/01/17  Yes [provider]  potassium chloride (K-DUR) 10 MEQ tablet Take 1 tablet (10 mEq total) by mouth 2 (two) times daily. Patient taking differently: Take 10 mEq by mouth daily.  08/06/16  Yes Tresa Garter, MD  risperiDONE (RISPERDAL) 2 MG tablet Take 2 tablets (4 mg total) by mouth daily. 08/06/16  Yes Ramaya Guile E, MD  VENTOLIN HFA 108 (90 Base) MCG/ACT inhaler INHALE 1 TO 2 PUFFS EVERY 6 HOURSPRN FOR WHEEZING/ SHORTNESS OF BREATH 01/11/17  Yes Kieren Adkison E, MD  dapagliflozin propanediol (FARXIGA) 10 MG TABS tablet Take 10 mg by mouth daily. 03/24/17   Tresa Garter, MD    Objective:   Vitals:   03/24/17 1527  BP: 114/64  Pulse: 81  Resp: 18  Temp: 98.3 F (36.8 C)  TempSrc: Oral  SpO2: 97%  Weight: (!) 324 lb (147 kg)  Height: 5\' 7"  (1.702 m)   Exam General appearance : Awake, alert, not in any distress. Speech Clear. Not toxic looking, morbidly obese HEENT: Atraumatic and Normocephalic, pupils equally reactive to light and accomodation Neck: Supple, no JVD. No cervical lymphadenopathy.  Chest: Good air entry bilaterally, no added sounds  CVS: S1 S2 regular, no murmurs.  Abdomen: Bowel sounds present, Non tender and not distended with no gaurding, rigidity or rebound. Extremities: B/L Lower Ext shows no edema, both legs are warm to touch Neurology: Awake alert, and oriented X 3, CN II-XII intact, Non focal Skin: No Rash  Data Review Lab Results  Component Value Date   HGBA1C 10.2 03/24/2017   HGBA1C 9.9 08/06/2016   HGBA1C 11.7 (H) 11/12/2015    Assessment & Plan   1. Type 2 diabetes mellitus without complication, with long-term current use of insulin (HCC)  - POCT A1C - Glucose (CBG)  - glipiZIDE (GLUCOTROL XL) 10 MG 24 hr tablet; Take 1 tablet (10  mg total) by mouth daily with breakfast.  Dispense: 90 tablet; Refill: 3 - metFORMIN (GLUCOPHAGE) 1000 MG tablet; Take 1 tablet (1,000 mg total) by mouth 2 (two) times daily with a meal.  Dispense: 180 tablet; Refill: 3  - Ambulatory referral to Ophthalmology for diabetic eye exam  Add - dapagliflozin propanediol (FARXIGA) 10 MG TABS tablet; Take 10 mg by mouth daily.  Dispense: 30 tablet; Refill: 3  2. Essential hypertension  - amLODipine (NORVASC) 10 MG tablet; Take 1 tablet (10 mg total) by mouth daily.  Dispense: 90 tablet; Refill: 3 - carvedilol (COREG) 12.5 MG tablet; Take 1 tablet (12.5 mg total) by mouth 2 (two) times daily.  Dispense: 180 tablet; Refill: 3 - furosemide (LASIX) 80 MG tablet; Take 1 tablet (80 mg total) by mouth daily. Pt takes it sometimes  Dispense: 90 tablet; Refill: 3 - losartan (COZAAR) 100 MG  tablet; Take 1 tablet (100 mg total) by mouth daily.  Dispense: 90 tablet; Refill: 3  3. Chronic diastolic heart failure (HCC)  - carvedilol (COREG) 12.5 MG tablet; Take 1 tablet (12.5 mg total) by mouth 2 (two) times daily.  Dispense: 180 tablet; Refill: 3 - furosemide (LASIX) 80 MG tablet; Take 1 tablet (80 mg total) by mouth daily. Pt takes it sometimes  Dispense: 90 tablet; Refill: 3  4. Morbid obesity due to excess calories (HCC)  Aim for 30 minutes of exercise most days. Rethink what you drink. Water is great! Aim for 2-3 Carb Choices per meal (30-45 grams) +/- 1 either way  Aim for 0-15 Carbs per snack if hungry  Include protein in moderation with your meals and snacks  Consider reading food labels for Total Carbohydrate and Fat Grams of foods  Consider checking BG at alternate times per day  Continue taking medication as directed Be mindful about how much sugar you are adding to beverages and other foods. Fruit Punch - find one with no sugar  Measure and decrease portions of carbohydrate foods  Make your plate and don't go back for seconds  Patient have  been counseled extensively about nutrition and exercise. Other issues discussed during this visit include: low cholesterol diet, weight control and daily exercise, foot care, annual eye examinations at Ophthalmology, importance of adherence with medications and regular follow-up. We also discussed long term complications of uncontrolled diabetes and hypertension.   Return in about 3 months (around 06/24/2017) for Hemoglobin A1C and Follow up, DM, Follow up HTN, Follow up Pain and comorbidities.  The patient was given clear instructions to go to ER or return to medical center if symptoms don't improve, worsen or new problems develop. The patient verbalized understanding. The patient was told to call to get lab results if they haven't heard anything in the next week.   This note has been created with Surveyor, quantity. Any transcriptional errors are unintentional.    [name], MD, [place], Karilyn Cota, [place] [facility] [place], [place]   03/24/2017, 3:55 PM

## 2017-03-24 NOTE — Patient Instructions (Signed)
Diabetes and Foot Care Diabetes may cause you to have problems because of poor blood supply (circulation) to your feet and legs. This may cause the skin on your feet to become thinner, break easier, and heal more slowly. Your skin may become dry, and the skin may peel and crack. You may also have nerve damage in your legs and feet causing decreased feeling in them. You may not notice minor injuries to your feet that could lead to infections or more serious problems. Taking care of your feet is one of the most important things you can do for yourself. Follow these instructions at home:  Wear shoes at all times, even in the house. Do not go barefoot. Bare feet are easily injured.  Check your feet daily for blisters, cuts, and redness. If you cannot see the bottom of your feet, use a mirror or ask someone for help.  Wash your feet with warm water (do not use hot water) and mild soap. Then pat your feet and the areas between your toes until they are completely dry. Do not soak your feet as this can dry your skin.  Apply a moisturizing lotion or petroleum jelly (that does not contain alcohol and is unscented) to the skin on your feet and to dry, brittle toenails. Do not apply lotion between your toes.  Trim your toenails straight across. Do not dig under them or around the cuticle. File the edges of your nails with an emery board or nail file.  Do not cut corns or calluses or try to remove them with medicine.  Wear clean socks or stockings every day. Make sure they are not too tight. Do not wear knee-high stockings since they may decrease blood flow to your legs.  Wear shoes that fit properly and have enough cushioning. To break in new shoes, wear them for just a few hours a day. This prevents you from injuring your feet. Always look in your shoes before you put them on to be sure there are no objects inside.  Do not cross your legs. This may decrease the blood flow to your feet.  If you find a  minor scrape, cut, or break in the skin on your feet, keep it and the skin around it clean and dry. These areas may be cleansed with mild soap and water. Do not cleanse the area with peroxide, alcohol, or iodine.  When you remove an adhesive bandage, be sure not to damage the skin around it.  If you have a wound, look at it several times a day to make sure it is healing.  Do not use heating pads or hot water bottles. They may burn your skin. If you have lost feeling in your feet or legs, you may not know it is happening until it is too late.  Make sure your health care provider performs a complete foot exam at least annually or more often if you have foot problems. Report any cuts, sores, or bruises to your health care provider immediately. Contact a health care provider if:  You have an injury that is not healing.  You have cuts or breaks in the skin.  You have an ingrown nail.  You notice redness on your legs or feet.  You feel burning or tingling in your legs or feet.  You have pain or cramps in your legs and feet.  Your legs or feet are numb.  Your feet always feel cold. Get help right away if:  There is increasing   redness, swelling, or pain in or around a wound.  There is a red line that goes up your leg.  Pus is coming from a wound.  You develop a fever or as directed by your health care provider.  You notice a bad smell coming from an ulcer or wound. This information is not intended to replace advice given to you by your health care provider. Make sure you discuss any questions you have with your health care provider. Document Released: 09/11/2000 Document Revised: 02/20/2016 Document Reviewed: 02/21/2013 Elsevier Interactive Patient Education  2017 Elsevier Inc. Blood Glucose Monitoring, Adult Monitoring your blood sugar (glucose) helps you manage your diabetes. It also helps you and your health care provider determine how well your diabetes management plan is  working. Blood glucose monitoring involves checking your blood glucose as often as directed, and keeping a record (log) of your results over time. Why should I monitor my blood glucose? Checking your blood glucose regularly can:  Help you understand how food, exercise, illnesses, and medicines affect your blood glucose.  Let you know what your blood glucose is at any time. You can quickly tell if you are having low blood glucose (hypoglycemia) or high blood glucose (hyperglycemia).  Help you and your health care provider adjust your medicines as needed. When should I check my blood glucose? Follow instructions from your health care provider about how often to check your blood glucose. This may depend on:  The type of diabetes you have.  How well-controlled your diabetes is.  Medicines you are taking. If you have type 1 diabetes:  Check your blood glucose at least 2 times a day.  Also check your blood glucose:  Before every insulin injection.  Before and after exercise.  Between meals.  2 hours after a meal.  Occasionally between 2:00 a.m. and 3:00 a.m., as directed.  Before potentially dangerous tasks, like driving or using heavy machinery.  At bedtime.  You may need to check your blood glucose more often, up to 6-10 times a day:  If you use an insulin pump.  If you need multiple daily injections (MDI).  If your diabetes is not well-controlled.  If you are ill.  If you have a history of severe hypoglycemia.  If you have a history of not knowing when your blood glucose is getting low (hypoglycemia unawareness). If you have type 2 diabetes:  If you take insulin or other diabetes medicines, check your blood glucose at least 2 times a day.  If you are on intensive insulin therapy, check your blood glucose at least 4 times a day. Occasionally, you may also need to check between 2:00 a.m. and 3:00 a.m., as directed.  Also check your blood glucose:  Before and after  exercise.  Before potentially dangerous tasks, like driving or using heavy machinery.  You may need to check your blood glucose more often if:  Your medicine is being adjusted.  Your diabetes is not well-controlled.  You are ill. What is a blood glucose log?  A blood glucose log is a record of your blood glucose readings. It helps you and your health care provider:  Look for patterns in your blood glucose over time.  Adjust your diabetes management plan as needed.  Every time you check your blood glucose, write down your result and notes about things that may be affecting your blood glucose, such as your diet and exercise for the day.  Most glucose meters store a record of glucose readings in   the meter. Some meters allow you to download your records to a computer. How do I check my blood glucose? Follow these steps to get accurate readings of your blood glucose: Supplies needed   Blood glucose meter.  Test strips for your meter. Each meter has its own strips. You must use the strips that come with your meter.  A needle to prick your finger (lancet). Do not use lancets more than once.  A device that holds the lancet (lancing device).  A journal or log book to write down your results. Procedure  Wash your hands with soap and water.  Prick the side of your finger (not the tip) with the lancet. Use a different finger each time.  Gently rub the finger until a small drop of blood appears.  Follow instructions that come with your meter for inserting the test strip, applying blood to the strip, and using your blood glucose meter.  Write down your result and any notes. Alternative testing sites  Some meters allow you to use areas of your body other than your finger (alternative sites) to test your blood.  If you think you may have hypoglycemia, or if you have hypoglycemia unawareness, do not use alternative sites. Use your finger instead.  Alternative sites may not be as  accurate as the fingers, because blood flow is slower in these areas. This means that the result you get may be delayed, and it may be different from the result that you would get from your finger.  The most common alternative sites are:  Forearm.  Thigh.  Palm of the hand. Additional tips  Always keep your supplies with you.  If you have questions or need help, all blood glucose meters have a 24-hour "hotline" number that you can call. You may also contact your health care provider.  After you use a few boxes of test strips, adjust (calibrate) your blood glucose meter by following instructions that came with your meter. This information is not intended to replace advice given to you by your health care provider. Make sure you discuss any questions you have with your health care provider. Document Released: 09/17/2003 Document Revised: 04/03/2016 Document Reviewed: 02/24/2016 Elsevier Interactive Patient Education  2017 Elsevier Inc.  

## 2017-03-29 NOTE — Progress Notes (Signed)
HPI: Follow-up chest pain, chronic diastolic congestive heart failure, obesity and hypertension. Patient has declined catheterization in the past. Echocardiogram January 2017 showed normal LV systolic function, moderate left ventricular hypertrophy and moderate left atrial enlargement. Last nuclear study August 2017 showed ejection fraction 73% and normal perfusion. Also with history of SVT. Since last seen, she has dyspnea with more extreme activities. No orthopnea, PND, pedal edema or syncope. She continues to have occasional chest pain which is chronic.  Current Outpatient Prescriptions  Medication Sig Dispense Refill  . amLODipine (NORVASC) 10 MG tablet Take 1 tablet (10 mg total) by mouth daily. 90 tablet 3  . carvedilol (COREG) 12.5 MG tablet Take 1 tablet (12.5 mg total) by mouth 2 (two) times daily. 180 tablet 3  . dapagliflozin propanediol (FARXIGA) 10 MG TABS tablet Take 10 mg by mouth daily. 30 tablet 3  . furosemide (LASIX) 80 MG tablet Take 1 tablet (80 mg total) by mouth daily. Pt takes it sometimes 90 tablet 3  . glipiZIDE (GLUCOTROL XL) 10 MG 24 hr tablet Take 1 tablet (10 mg total) by mouth daily with breakfast. 90 tablet 3  . glucose blood test strip Use as instructed 100 each 12  . losartan (COZAAR) 100 MG tablet Take 1 tablet (100 mg total) by mouth daily. 90 tablet 3  . metFORMIN (GLUCOPHAGE) 1000 MG tablet Take 1 tablet (1,000 mg total) by mouth 2 (two) times daily with a meal. 180 tablet 3  . nitroGLYCERIN (NITROSTAT) 0.4 MG SL tablet Place 0.4 mg under the tongue every 5 (five) minutes as needed for chest pain.    Marland Kitchen oxybutynin (DITROPAN XL) 15 MG 24 hr tablet Take 1 tablet by mouth daily.  3  . potassium chloride (K-DUR) 10 MEQ tablet Take 1 tablet (10 mEq total) by mouth 2 (two) times daily. (Patient taking differently: Take 10 mEq by mouth daily. ) 60 tablet 3  . risperiDONE (RISPERDAL) 2 MG tablet Take 2 tablets (4 mg total) by mouth daily. 60 tablet 3   No  current facility-administered medications for this visit.      Past Medical History:  Diagnosis Date  . Bipolar disease, chronic ([place])   . Chest pain    a. 2012 Myoview: EF 63%, no isch/infarct;  b. 04/2016 Lexiscan MV: EF 73%, no ischemia/infarct-->Low risk.  . CHF (congestive heart failure) ([place])   . Chronic diastolic CHF (congestive heart failure) ([place])    a. 2015 Echo: EF 55-60%, Gr2 DD;  b. 09/2015 Echo: EF 60-65%, no rwma, mod dil LA, PASP 77mmHg.  Marland Kitchen History of thyrotoxicosis   . Hypertensive heart disease 10/18/2013  . Insulin dependent type 2 diabetes mellitus, uncontrolled ([place])   . Mediastinal adenopathy   . MI (myocardial infarction) ([place])   . Morbid obesity with BMI of 50.0-59.9, adult ([place])   . OSA (obstructive sleep apnea) 03/06/2011    History reviewed. No pertinent surgical history.  Social History   Social History  . Marital status: Single    Spouse name: N/A  . Number of children: 0  . Years of education: 23   Occupational History  . unemployed    Social History Main Topics  . Smoking status: Never Smoker  . Smokeless tobacco: Never Used  . Alcohol use No  . Drug use: No  . Sexual activity: Not Currently    Birth control/ protection: None   Other Topics Concern  . 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 But ran out of money and is no longer attending school. (Note patient has bipolar disorder).      Born in [place] but lived in [place] before coming back to [place].       Primary language is Arabic. Lives with mother and brother.                Family History  Problem Relation Age of Onset  . Heart failure Father   . Stroke Father   . Hypertension Mother   . Heart disease Maternal Grandfather   . Heart disease Paternal Grandfather     ROS: no fevers or chills, productive cough, hemoptysis, dysphasia, odynophagia, melena,  hematochezia, dysuria, hematuria, rash, seizure activity, orthopnea, PND, pedal edema, claudication. Remaining systems are negative.  Physical Exam: Well-developed obese in no acute distress.  Skin is warm and dry.  HEENT is normal.  Neck is supple.  Chest is clear to auscultation with normal expansion.  Cardiovascular exam is regular rate and rhythm.  Abdominal exam nontender or distended. No masses palpated. Extremities show no edema. neuro grossly intact   A/P  1 chest pain-symptoms are chronic. Previous nuclear study showed no ischemia. Next evaluation would be cardiac catheterization. However she declines catheterization. Continue medical therapy.   2 chronic diastolic congestive heart failure-exam is difficult due to obesity. Symptoms appear to be at baseline. Continue present dose of Lasix.Check potassium and renal function.   3 morbid obesity-we discussed the importance of weight loss.  4 hypertension-blood pressure is elevated. Continue present medications but increase Coreg to 25 mg twice a day and follow.    5 diabetes mellitus-management per primary care.   [name], MD

## 2017-04-12 ENCOUNTER — Encounter: Payer: Self-pay | Admitting: Cardiology

## 2017-04-12 ENCOUNTER — Ambulatory Visit (INDEPENDENT_AMBULATORY_CARE_PROVIDER_SITE_OTHER): Payer: No Typology Code available for payment source | Admitting: Cardiology

## 2017-04-12 VITALS — BP 184/96 | HR 98 | Ht 67.0 in | Wt 330.0 lb

## 2017-04-12 DIAGNOSIS — R072 Precordial pain: Secondary | ICD-10-CM

## 2017-04-12 DIAGNOSIS — I5032 Chronic diastolic (congestive) heart failure: Secondary | ICD-10-CM

## 2017-04-12 DIAGNOSIS — I10 Essential (primary) hypertension: Secondary | ICD-10-CM

## 2017-04-12 MED ORDER — CARVEDILOL 25 MG PO TABS: 25.0000 mg | ORAL_TABLET | Freq: Two times a day (BID) | ORAL | 3 refills | Status: DC

## 2017-04-12 NOTE — Patient Instructions (Signed)
Medication Instructions:   INCREASE CARVEDILOL TO 25 MG TWICE DAILY= 2 OF THE 12.5 MG TABLETS TWICE DAILY  Labwork:  Your physician recommends that you HAVE LAB WORK TODAY  Follow-Up:  Your physician wants you to follow-up in: [place] will receive a reminder letter in the mail two months in advance. If you don't receive a letter, please call our office to schedule the follow-up appointment.   If you need a refill on your cardiac medications before your next appointment, please call your pharmacy.

## 2017-04-14 ENCOUNTER — Encounter: Payer: Self-pay | Admitting: *Deleted

## 2017-04-14 LAB — BASIC METABOLIC PANEL
BUN / CREAT RATIO: 16 (ref 9–23)
BUN: 10 mg/dL (ref 6–24)
CHLORIDE: 101 mmol/L (ref 96–106)
CO2: 17 mmol/L — AB (ref 20–29)
Calcium: 8.9 mg/dL (ref 8.7–10.2)
Creatinine, Ser: 0.61 mg/dL (ref 0.57–1.00)
GFR calc Af Amer: 123 mL/min/{1.73_m2} (ref >59)
GFR calc non Af Amer: 107 mL/min/{1.73_m2} (ref >59)
GLUCOSE: 377 mg/dL — AB (ref 65–99)
POTASSIUM: 4.1 mmol/L (ref 3.5–5.2)
SODIUM: 137 mmol/L (ref 134–144)

## 2017-04-19 ENCOUNTER — Ambulatory Visit (INDEPENDENT_AMBULATORY_CARE_PROVIDER_SITE_OTHER): Payer: No Typology Code available for payment source | Admitting: Emergency Medicine

## 2017-04-19 ENCOUNTER — Encounter: Payer: Self-pay | Admitting: Emergency Medicine

## 2017-04-19 DIAGNOSIS — R938 Abnormal findings on diagnostic imaging of other specified body structures: Secondary | ICD-10-CM

## 2017-04-19 DIAGNOSIS — G4733 Obstructive sleep apnea (adult) (pediatric): Secondary | ICD-10-CM

## 2017-04-19 DIAGNOSIS — R9389 Abnormal findings on diagnostic imaging of other specified body structures: Secondary | ICD-10-CM

## 2017-04-19 DIAGNOSIS — A310 Pulmonary mycobacterial infection: Secondary | ICD-10-CM

## 2017-04-19 NOTE — Patient Instructions (Signed)
Based on our discussion today we will not try to reorder CPAP at this time. We should likely repeat your CT scan of the chest in March 2019. No new medications at this time Continue your good work on exercise and weight loss. Follow with Dr Lamonte Sakai in February 2019, or sooner if you have any problems.

## 2017-04-19 NOTE — Assessment & Plan Note (Signed)
She tells me that she has not been contacted by to get CPAP. This was the second time that I have ordered. I suspect that actually she just does not the device. She confirms this on questioning today. Was to continue working on weight loss. Avoid CPAP possible. I will not reorder at this time.

## 2017-04-19 NOTE — Progress Notes (Signed)
Subjective:    Patient ID: [name], female    DOB: [birth year], 51 y.o.   MRN: [id number]  Shortness of Breath  Pertinent negatives include no rhinorrhea.   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 Patient has significant comorbidities which include untreated sleep apnea, diastolic heart failure. Recent respiratory infection treated with Levaquin in January 2017. Chest CTA scan showed no filling defects, poor inspiratory effort, enlargement of 2R and 7 mediastinal nodes, very subtle base predominant bilateral patchy groundglass like infiltrates, ? airtrapping. She had a chest CT scan in 2012 which looks similar but the lymph nodes were not as prominent. 51 y.o. quant gold was negative, ACE normal, HIV negative. Her sputum cx from hospitalization > [facility]. She was to have a repeat Ct chest to further eval her infiltrates but this has not happened yet.   She is having daily cough, can wake her from sleep. Prod of green to yellow, she is having allergy sx. She is on an anti-histamine, ? Which one. Not taking her nasal steroid currently. Her breathing is stable - she does have some exertional dyspnea.    She has a CPAP but is not using it reliably. PSG on 3/5 states that she she needs CPAP therapy on 19 cm H2O with a Medium size Resmed Full Face Mask AirFit F20 mask    ROV 05/05/16 -- This is a follow-up visit for patient with obstructive sleep apnea determined to need CPAP 19 cmH2O. Also with a history of Mediastinal lymphadenopathy and some basilar groundglass infiltrate. Sputum culture from 2/17 was positive for Myco bacterium avium.  I performed a repeat CT scan of the chest on 02/03/16 that showed improvement in some diffuse groundglass infiltrate, no evidence of interstitial lung disease or bronchiectasis and some stable mild to moderate bilateral hilar adenopathy. She  has a CPAP machine with her today that she says does not work. She also states that she has difficulty tolerating full face mask. She did hear from [facility], but she not ever go to see them to get new pressure or a new mask. She says that she has some exertional mid-sternal CP. She has been having this since February, 2017.  She has been recommended to have a L heart cath in the past but I believe she refused it.   ROV 08/05/16 -- this is a follow-up visit for obstructive sleep apnea, mediastinal lymphadenopathy on CT scan of the chest, hypertension, and a prior asymptomatic culture positive for [facility] 11/13/15. She has seen cardiology for blood pressure control and also had a myocardial perfusion stress test on 05/21/16 that showed no evidence to support coronary disease. Today her BP is 160/102. It was recommended that she start CPAP at 19 cm water - she says that she was never contacted to get this. She denies any cough or SOB. Her CP has gradually improved. She is able to walk every day.   Needs CPAP 19 cm H2O with a Medium size Resmed Full Face Mask AirFit F20 mask and heated humidification  ROV 01/18/17 -- this follow-up visit for patient with a history of OSA, mediastinal lymphadenopathy on CT scan of the chest, culture positive for Mycobacterium avium (not treated). She has systemic and artery hypertension. She has been seen since our last visit for chest discomfort - cardiac catheterization has been recommended but she had said not to do this.  She did have a negative stress test in August 2017. The chest heaviness is associated with dyspnea. She does not have her CPAP mask - I ordered it in November but she says that she never got it from [facility]. She underwent Ct chest 12/25/16 that I have reviewed >> no real change in mediastinal LAD, ? Some dependent atx, ? Some subtle GG changes, all stable.   ROV 04/19/17 -- this is a follow-up visit for patient with a history of abnormal CT scan of the chest characterized by  mediastinal lymphadenopathy and scattered groundglass changes. She is respiratory culture positive for Mycobacterium avium (never treated). Her last CT scan of the chest was March 2018.  Also with a history of obstructive sleep apnea. Again she tells me that she was never contacted by DME regarding CPAP. No desat on walk last visit. She tells me that her breathing has been better since last visit. She has lost about 18 lbs since last time. She tells me that she does not want a CPAP, not to re-order.  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     Review of Systems  HENT: Negative for postnasal drip and rhinorrhea.   Respiratory: Negative for cough, chest tightness and shortness of breath.   Psychiatric/Behavioral: Positive for sleep disturbance.   As per HPI     Objective:   Physical Exam Vitals:   04/19/17 1152  BP: (!) 148/80  Pulse: 88  SpO2: 96%  Weight: (!) 322 lb (146.1 kg)  Height: 5' 7"  (1.702 m)    Gen: Pleasant, obese woman, in no distress,  normal affect  ENT: No lesions,  mouth clear,  oropharynx clear, no postnasal drip  Neck: No JVD, no TMG, no carotid bruits  Lungs: No use of accessory muscles, small breaths, no crackles or rhonchi  Cardiovascular: RRR, heart sounds normal, no murmur or gallops, trace peripheral edema  Musculoskeletal: No deformities, no cyanosis or clubbing  Neuro: alert, non focal  Skin: Warm, no lesions or rashes      Assessment & Plan:  OSA (obstructive sleep apnea)- non compliant with C-pap She tells me that she has not been contacted by to get CPAP. This was the second time that I have ordered. I suspect that actually she just does not the device. She confirms this on questioning today. Was to continue working on weight loss. Avoid CPAP possible. I will not reorder at this time.  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 Remains asymptomatic without any  dyspnea. She is losing weight but this is intentional.  Abnormal CT scan, chest Plan to repeat her CT scan of the chest in March 2019. I will see her in February to plan this.  Baltazar Apo, MD, PhD 04/19/2017, 12:10 PM [facility] [phone] or if no answer [phone]

## 2017-04-19 NOTE — Assessment & Plan Note (Signed)
Culture positive. Untreated. Remains asymptomatic without any dyspnea. She is losing weight but this is intentional.

## 2017-04-19 NOTE — Assessment & Plan Note (Signed)
Plan to repeat her CT scan of the chest in March 2019. I will see her in February to plan this.

## 2017-06-03 ENCOUNTER — Other Ambulatory Visit: Payer: Self-pay | Admitting: Internal Medicine

## 2017-06-03 DIAGNOSIS — N393 Stress incontinence (female) (male): Secondary | ICD-10-CM

## 2017-06-03 MED FILL — ?CARVEDILOL 25 MG TABLET: 25 | 30 days supply | Qty: 60 | Fill #0

## 2017-06-03 MED FILL — LOSARTAN POTASSIUM 100 MG T: 100 | 30 days supply | Qty: 30 | Fill #0

## 2017-06-03 MED FILL — !VENTOLIN HFA INHALER: 108 (90 BAS | 25 days supply | Qty: 18 | Fill #0

## 2017-06-03 MED FILL — ?FUROSEMIDE 80MG TABLET: 80 | 30 days supply | Qty: 30 | Fill #0

## 2017-06-03 MED FILL — ?METFORMIN HCL 1,000 MG TAB: 1000 | 30 days supply | Qty: 60 | Fill #1

## 2017-06-03 MED FILL — glipiZIDE XL 10 MG TB24: 10 | 30 days supply | Qty: 30 | Fill #0

## 2017-06-03 MED FILL — AMLODIPINE BESYLATE 10 MG T: 10 | 30 days supply | Qty: 30 | Fill #1

## 2017-06-03 MED FILL — !FARXIGA 10MG TABLET: 10 | 21 days supply | Qty: 21 | Fill #1

## 2017-06-04 MED FILL — OXYBUTYNIN CL ER 15 MG TAB: 15 | 30 days supply | Qty: 30 | Fill #0

## 2017-07-20 ENCOUNTER — Encounter: Payer: Self-pay | Admitting: Emergency Medicine

## 2017-07-20 ENCOUNTER — Ambulatory Visit (INDEPENDENT_AMBULATORY_CARE_PROVIDER_SITE_OTHER): Payer: No Typology Code available for payment source | Admitting: Emergency Medicine

## 2017-07-20 ENCOUNTER — Ambulatory Visit (INDEPENDENT_AMBULATORY_CARE_PROVIDER_SITE_OTHER)
Admission: RE | Admit: 2017-07-20 | Discharge: 2017-07-20 | Disposition: A | Discharge status: Home / Self Care | Payer: No Typology Code available for payment source | Source: Ambulatory Visit | Attending: Emergency Medicine | Admitting: Emergency Medicine

## 2017-07-20 DIAGNOSIS — R0600 Dyspnea, unspecified: Secondary | ICD-10-CM

## 2017-07-20 DIAGNOSIS — A310 Pulmonary mycobacterial infection: Secondary | ICD-10-CM

## 2017-07-20 DIAGNOSIS — G4733 Obstructive sleep apnea (adult) (pediatric): Secondary | ICD-10-CM

## 2017-07-20 DIAGNOSIS — R590 Localized enlarged lymph nodes: Secondary | ICD-10-CM

## 2017-07-20 NOTE — Addendum Note (Signed)
Addended by: Della Goo C on: 07/20/2017 12:36 PM   Modules accepted: Orders

## 2017-07-20 NOTE — Patient Instructions (Signed)
We will perform a chest x-ray today Please start taking your Lasix 80 mg once a day more reliably.  It sounds like your shortness of breath is directly related to the days when you have to miss this medication. Depending on your chest x-ray we may decide to perform your CT scan of the chest early. We will discuss possibly restarting your CPAP at your next visit Follow with Dr Lamonte Sakai in 1 month

## 2017-07-20 NOTE — Assessment & Plan Note (Signed)
Not currently treated. ?

## 2017-07-20 NOTE — Assessment & Plan Note (Signed)
Not currently on treatment.  She is willing to reconsider going back on CPAP.  We will revisit this at her next office visit.

## 2017-07-20 NOTE — Assessment & Plan Note (Signed)
Based on her description I suspect that this is volume overload with associated orthopnea.  It does seem to respond when she takes her Lasix daily as originally ordered.  I will perform a chest x-ray today.  Need to also consider possible progression of her mycobacterial disease although no other associated symptoms to support this.

## 2017-07-20 NOTE — Assessment & Plan Note (Signed)
With some areas of groundglass, positive for [facility].  She has not been treated for this.  Our plan has been to repeat her CT scan of the chest in March 2019.  If her chest x-ray is suggestive of progressive mycobacterial disease then I will perform her CT scan early.

## 2017-07-20 NOTE — Progress Notes (Signed)
   Subjective:    Patient ID: [name], female    DOB: 1966-05-01, 51 y.o.   MRN: [id number]  Shortness of Breath  Pertinent negatives include no rhinorrhea.   ROV 07/20/17 --51 year old [place] woman with a history of an abnormal CT scan of the chest that we have been following with serial scans.  She has mediastinal lymphadenopathy and some scattered groundglass changes.  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  She also has obstructive sleep apnea.  In July we discussed restarting CPAP and she did not want to do so. She tells me that she has had more trouble over the last week she has had increased exertional dyspnea. Has orthopnea.  She has needed to miss her lasix because it is difficult for her to work when she takes it - needs to go to the bathroom often. She has had some associated chest tightness, again improved when takes her diuretics. She is concerned that her CBG's have been elevated, followed by Dr Doreene Burke. She feels an upper airway irritation, non-productive cough 1-2x a day.   Her quant gold was negative, ACE normal, HIV negative.    Review of Systems  HENT: Negative for postnasal drip and rhinorrhea.   Respiratory: Negative for cough, chest tightness and shortness of breath.   Psychiatric/Behavioral: Positive for sleep disturbance.   As per HPI     Objective:   Physical Exam Vitals:   07/20/17 1205 07/20/17 1206  BP:  132/78  Pulse:  83  SpO2:  93%  Weight: (!) 335 lb (152 kg)   Height: _0  (1.702 m)     Gen: Pleasant, obese woman, in no distress,  normal affect  ENT: No lesions,  mouth clear,  oropharynx clear, no postnasal drip  Neck: No JVD, no TMG, no carotid bruits  Lungs: No use of accessory muscles, small breaths, no crackles or rhonchi  Cardiovascular: RRR, heart sounds normal, no murmur or gallops, trace peripheral edema  Musculoskeletal: No deformities, no cyanosis or clubbing  Neuro:  alert, non focal  Skin: Warm, no lesions or rashes      Assessment & Plan:  OSA (obstructive sleep apnea)- non compliant with C-pap Not currently on treatment.  She is willing to reconsider going back on CPAP.  We will revisit this at her next office visit.  Mediastinal adenopathy With some areas of groundglass, positive for [facility].  She has not been treated for this.  Our plan has been to repeat her CT scan of the chest in March 2019.  If her chest x-ray is suggestive of progressive mycobacterial disease then I will perform her CT scan early.  Mycobacterium avium complex [facility]) Not currently treated  Dyspnea Based on her description I suspect that this is volume overload with associated orthopnea.  It does seem to respond when she takes her Lasix daily as originally ordered.  I will perform a chest x-ray today.  Need to also consider possible progression of her mycobacterial disease although no other associated symptoms to support this.  Baltazar Apo, MD, PhD 07/20/2017, 12:32 PM [facility] [phone] or if no answer [phone]

## 2017-08-02 ENCOUNTER — Encounter (HOSPITAL_COMMUNITY): Payer: Self-pay | Admitting: Family Medicine

## 2017-08-02 ENCOUNTER — Encounter (HOSPITAL_COMMUNITY): Payer: Self-pay | Admitting: Emergency Medicine

## 2017-08-02 ENCOUNTER — Emergency Department (HOSPITAL_COMMUNITY): Payer: No Typology Code available for payment source

## 2017-08-02 ENCOUNTER — Emergency Department (HOSPITAL_COMMUNITY)
Admission: EM | Admit: 2017-08-02 | Discharge: 2017-08-02 | Disposition: A | Discharge status: Home / Self Care | Payer: No Typology Code available for payment source | Attending: Emergency Medicine | Admitting: Emergency Medicine

## 2017-08-02 DIAGNOSIS — I252 Old myocardial infarction: Secondary | ICD-10-CM

## 2017-08-02 DIAGNOSIS — A310 Pulmonary mycobacterial infection: Secondary | ICD-10-CM | POA: Diagnosis present

## 2017-08-02 DIAGNOSIS — Z888 Allergy status to other drugs, medicaments and biological substances status: Secondary | ICD-10-CM

## 2017-08-02 DIAGNOSIS — G8929 Other chronic pain: Secondary | ICD-10-CM | POA: Diagnosis present

## 2017-08-02 DIAGNOSIS — E876 Hypokalemia: Secondary | ICD-10-CM | POA: Diagnosis not present

## 2017-08-02 DIAGNOSIS — R195 Other fecal abnormalities: Secondary | ICD-10-CM | POA: Diagnosis present

## 2017-08-02 DIAGNOSIS — Y92009 Unspecified place in unspecified non-institutional (private) residence as the place of occurrence of the external cause: Secondary | ICD-10-CM

## 2017-08-02 DIAGNOSIS — T501X6A Underdosing of loop [high-ceiling] diuretics, initial encounter: Secondary | ICD-10-CM | POA: Diagnosis present

## 2017-08-02 DIAGNOSIS — I110 Hypertensive heart disease with heart failure: Principal | ICD-10-CM | POA: Diagnosis present

## 2017-08-02 DIAGNOSIS — I2729 Other secondary pulmonary hypertension: Secondary | ICD-10-CM | POA: Diagnosis present

## 2017-08-02 DIAGNOSIS — T501X5A Adverse effect of loop [high-ceiling] diuretics, initial encounter: Secondary | ICD-10-CM | POA: Diagnosis not present

## 2017-08-02 DIAGNOSIS — G4733 Obstructive sleep apnea (adult) (pediatric): Secondary | ICD-10-CM | POA: Diagnosis present

## 2017-08-02 DIAGNOSIS — Z8249 Family history of ischemic heart disease and other diseases of the circulatory system: Secondary | ICD-10-CM

## 2017-08-02 DIAGNOSIS — Z5321 Procedure and treatment not carried out due to patient leaving prior to being seen by health care provider: Secondary | ICD-10-CM | POA: Insufficient documentation

## 2017-08-02 DIAGNOSIS — F3162 Bipolar disorder, current episode mixed, moderate: Secondary | ICD-10-CM | POA: Diagnosis present

## 2017-08-02 DIAGNOSIS — R0602 Shortness of breath: Secondary | ICD-10-CM | POA: Insufficient documentation

## 2017-08-02 DIAGNOSIS — I5033 Acute on chronic diastolic (congestive) heart failure: Secondary | ICD-10-CM | POA: Diagnosis present

## 2017-08-02 DIAGNOSIS — D638 Anemia in other chronic diseases classified elsewhere: Secondary | ICD-10-CM | POA: Diagnosis present

## 2017-08-02 DIAGNOSIS — Z6841 Body Mass Index (BMI) 40.0 and over, adult: Secondary | ICD-10-CM

## 2017-08-02 DIAGNOSIS — Z9119 Patient's noncompliance with other medical treatment and regimen: Secondary | ICD-10-CM

## 2017-08-02 DIAGNOSIS — R200 Anesthesia of skin: Secondary | ICD-10-CM | POA: Diagnosis present

## 2017-08-02 DIAGNOSIS — D509 Iron deficiency anemia, unspecified: Secondary | ICD-10-CM | POA: Diagnosis present

## 2017-08-02 DIAGNOSIS — Y92230 Patient room in hospital as the place of occurrence of the external cause: Secondary | ICD-10-CM | POA: Diagnosis not present

## 2017-08-02 DIAGNOSIS — R590 Localized enlarged lymph nodes: Secondary | ICD-10-CM | POA: Diagnosis present

## 2017-08-02 DIAGNOSIS — Z886 Allergy status to analgesic agent status: Secondary | ICD-10-CM

## 2017-08-02 DIAGNOSIS — J9621 Acute and chronic respiratory failure with hypoxia: Secondary | ICD-10-CM | POA: Diagnosis present

## 2017-08-02 DIAGNOSIS — Z91128 Patient's intentional underdosing of medication regimen for other reason: Secondary | ICD-10-CM

## 2017-08-02 DIAGNOSIS — E1165 Type 2 diabetes mellitus with hyperglycemia: Secondary | ICD-10-CM | POA: Diagnosis present

## 2017-08-02 DIAGNOSIS — F419 Anxiety disorder, unspecified: Secondary | ICD-10-CM | POA: Diagnosis present

## 2017-08-02 DIAGNOSIS — Z7984 Long term (current) use of oral hypoglycemic drugs: Secondary | ICD-10-CM

## 2017-08-02 LAB — CBC
HEMATOCRIT: 27.7 % — AB (ref 36.0–46.0)
HEMOGLOBIN: 8.1 g/dL — AB (ref 12.0–15.0)
MCH: 21.1 pg — ABNORMAL LOW (ref 26.0–34.0)
MCHC: 29.2 g/dL — AB (ref 30.0–36.0)
MCV: 72.3 fL — ABNORMAL LOW (ref 78.0–100.0)
PLATELETS: 269 10*3/uL (ref 150–400)
RBC: 3.83 MIL/uL — AB (ref 3.87–5.11)
RDW: 18.4 % — ABNORMAL HIGH (ref 11.5–15.5)
WBC: 7.2 10*3/uL (ref 4.0–10.5)

## 2017-08-02 LAB — BASIC METABOLIC PANEL
ANION GAP: 8 (ref 5–15)
BUN: 18 mg/dL (ref 6–20)
CO2: 26 mmol/L (ref 22–32)
Calcium: 9.2 mg/dL (ref 8.9–10.3)
Chloride: 105 mmol/L (ref 101–111)
Creatinine, Ser: 0.67 mg/dL (ref 0.44–1.00)
GLUCOSE: 184 mg/dL — AB (ref 65–99)
POTASSIUM: 3.7 mmol/L (ref 3.5–5.1)
Sodium: 139 mmol/L (ref 135–145)

## 2017-08-02 LAB — I-STAT TROPONIN, ED: TROPONIN I, POC: 0.01 ng/mL (ref 0.00–0.08)

## 2017-08-02 NOTE — ED Triage Notes (Signed)
Patient c/o left sided chest pain and SOB that has increased over the past 9 days. Patient reports SOb gotten with just by taking few steps. Patient reports that she takes Lasix and having to take it just at night due to not able to take it during day with her job.

## 2017-08-02 NOTE — ED Triage Notes (Signed)
Patient was triaged by Corliss Skains, RN at 19:02. Accidentally charted on wrong patient and discharged. Recovered Carrie's note and re triaged.

## 2017-08-02 NOTE — ED Triage Notes (Signed)
Patient is reporting he has had hyperglycemia for the last 4 days , above 400mg /dL/. Also, reports he has been going to the pain clinic for the last 27 years and recently stopped going due to an argument with the provider. Patient is alert, oriented x 4, and appears in no acute distress.

## 2017-08-03 ENCOUNTER — Inpatient Hospital Stay (HOSPITAL_COMMUNITY)
Admission: EM | Admit: 2017-08-03 | Discharge: 2017-08-13 | DRG: 291 | Disposition: A | Discharge status: Home / Self Care | Payer: No Typology Code available for payment source | Attending: Internal Medicine | Admitting: Internal Medicine

## 2017-08-03 ENCOUNTER — Emergency Department (HOSPITAL_COMMUNITY): Payer: Self-pay

## 2017-08-03 ENCOUNTER — Other Ambulatory Visit: Payer: Self-pay

## 2017-08-03 DIAGNOSIS — G4733 Obstructive sleep apnea (adult) (pediatric): Secondary | ICD-10-CM

## 2017-08-03 DIAGNOSIS — E876 Hypokalemia: Secondary | ICD-10-CM

## 2017-08-03 DIAGNOSIS — I5033 Acute on chronic diastolic (congestive) heart failure: Secondary | ICD-10-CM | POA: Diagnosis present

## 2017-08-03 DIAGNOSIS — E119 Type 2 diabetes mellitus without complications: Secondary | ICD-10-CM

## 2017-08-03 DIAGNOSIS — I2781 Cor pulmonale (chronic): Secondary | ICD-10-CM

## 2017-08-03 DIAGNOSIS — D649 Anemia, unspecified: Secondary | ICD-10-CM

## 2017-08-03 DIAGNOSIS — E1165 Type 2 diabetes mellitus with hyperglycemia: Secondary | ICD-10-CM

## 2017-08-03 DIAGNOSIS — Z9119 Patient's noncompliance with other medical treatment and regimen: Secondary | ICD-10-CM

## 2017-08-03 DIAGNOSIS — A310 Pulmonary mycobacterial infection: Secondary | ICD-10-CM

## 2017-08-03 DIAGNOSIS — E66813 Obesity, class 3: Secondary | ICD-10-CM

## 2017-08-03 DIAGNOSIS — I10 Essential (primary) hypertension: Secondary | ICD-10-CM

## 2017-08-03 DIAGNOSIS — J9621 Acute and chronic respiratory failure with hypoxia: Secondary | ICD-10-CM

## 2017-08-03 LAB — CBC
HEMATOCRIT: 27.2 % — AB (ref 36.0–46.0)
HEMOGLOBIN: 7.9 g/dL — AB (ref 12.0–15.0)
MCH: 20.8 pg — ABNORMAL LOW (ref 26.0–34.0)
MCHC: 29 g/dL — ABNORMAL LOW (ref 30.0–36.0)
MCV: 71.8 fL — AB (ref 78.0–100.0)
PLATELETS: 274 10*3/uL (ref 150–400)
RBC: 3.79 MIL/uL — AB (ref 3.87–5.11)
RDW: 18.3 % — ABNORMAL HIGH (ref 11.5–15.5)
WBC: 6.2 10*3/uL (ref 4.0–10.5)

## 2017-08-03 LAB — GLUCOSE, CAPILLARY
GLUCOSE-CAPILLARY: 218 mg/dL — AB (ref 65–99)
GLUCOSE-CAPILLARY: 219 mg/dL — AB (ref 65–99)
GLUCOSE-CAPILLARY: 158 mg/dL — AB (ref 65–99)
Glucose-Capillary: 158 mg/dL — ABNORMAL HIGH (ref 65–99)

## 2017-08-03 LAB — COMPREHENSIVE METABOLIC PANEL
ALBUMIN: 3.5 g/dL (ref 3.5–5.0)
ALT: 11 U/L — ABNORMAL LOW (ref 14–54)
ANION GAP: 10 (ref 5–15)
AST: 17 U/L (ref 15–41)
Alkaline Phosphatase: 103 U/L (ref 38–126)
BUN: 16 mg/dL (ref 6–20)
CO2: 25 mmol/L (ref 22–32)
Calcium: 9 mg/dL (ref 8.9–10.3)
Chloride: 103 mmol/L (ref 101–111)
Creatinine, Ser: 0.6 mg/dL (ref 0.44–1.00)
GFR calc non Af Amer: >60 mL/min (ref >60)
GLUCOSE: 128 mg/dL — AB (ref 65–99)
POTASSIUM: 3.9 mmol/L (ref 3.5–5.1)
SODIUM: 138 mmol/L (ref 135–145)
Total Bilirubin: 0.5 mg/dL (ref 0.3–1.2)
Total Protein: 7.3 g/dL (ref 6.5–8.1)

## 2017-08-03 LAB — BRAIN NATRIURETIC PEPTIDE: B Natriuretic Peptide: 250.5 pg/mL — ABNORMAL HIGH (ref 0.0–100.0)

## 2017-08-03 LAB — I-STAT BETA HCG BLOOD, ED (NOT ORDERABLE)

## 2017-08-03 LAB — CREATININE, SERUM
Creatinine, Ser: 0.61 mg/dL (ref 0.44–1.00)
GFR calc non Af Amer: >60 mL/min (ref >60)

## 2017-08-03 LAB — D-DIMER, QUANTITATIVE (NOT AT ARMC): D DIMER QUANT: 0.48 ug{FEU}/mL (ref 0.00–0.50)

## 2017-08-03 LAB — POCT I-STAT TROPONIN I: TROPONIN I, POC: 0 ng/mL (ref 0.00–0.08)

## 2017-08-03 MED ORDER — FUROSEMIDE 10 MG/ML IJ SOLN
80.0000 mg | Freq: Two times a day (BID) | INTRAMUSCULAR | Status: DC
  Administered 2017-08-03 – 2017-08-08 (×10): 80 mg via INTRAVENOUS
  Filled 2017-08-03 (×10): qty 8

## 2017-08-03 MED ORDER — FUROSEMIDE 10 MG/ML IJ SOLN
80.0000 mg | Freq: Once | INTRAMUSCULAR | Status: AC
  Administered 2017-08-03: 80 mg via INTRAVENOUS
  Filled 2017-08-03: qty 8

## 2017-08-03 MED ORDER — LOSARTAN POTASSIUM 50 MG PO TABS
100.0000 mg | ORAL_TABLET | Freq: Every day | ORAL | Status: DC
  Administered 2017-08-03 – 2017-08-13 (×11): 100 mg via ORAL
  Filled 2017-08-03 (×11): qty 2

## 2017-08-03 MED ORDER — ASPIRIN 81 MG PO CHEW
324.0000 mg | CHEWABLE_TABLET | Freq: Once | ORAL | Status: AC
  Administered 2017-08-03: 324 mg via ORAL
  Filled 2017-08-03: qty 4

## 2017-08-03 MED ORDER — NITROGLYCERIN 0.4 MG SL SUBL: 0.4000 mg | SUBLINGUAL_TABLET | SUBLINGUAL | Status: DC | PRN

## 2017-08-03 MED ORDER — ENOXAPARIN SODIUM 80 MG/0.8ML ~~LOC~~ SOLN
70.0000 mg | SUBCUTANEOUS | Status: DC
  Administered 2017-08-03 – 2017-08-07 (×5): 70 mg via SUBCUTANEOUS
  Filled 2017-08-03 (×2): qty 0.8

## 2017-08-03 MED ORDER — SENNOSIDES-DOCUSATE SODIUM 8.6-50 MG PO TABS
1.0000 | ORAL_TABLET | Freq: Two times a day (BID) | ORAL | Status: DC
  Administered 2017-08-03 – 2017-08-13 (×21): 1 via ORAL
  Filled 2017-08-03 (×21): qty 1

## 2017-08-03 MED ORDER — RISPERIDONE 2 MG PO TABS
4.0000 mg | ORAL_TABLET | Freq: Every day | ORAL | Status: DC
  Administered 2017-08-03 – 2017-08-13 (×11): 4 mg via ORAL
  Filled 2017-08-03 (×11): qty 2

## 2017-08-03 MED ORDER — ASPIRIN EC 81 MG PO TBEC
81.0000 mg | DELAYED_RELEASE_TABLET | Freq: Every day | ORAL | Status: DC
  Administered 2017-08-04 – 2017-08-13 (×10): 81 mg via ORAL
  Filled 2017-08-03 (×10): qty 1

## 2017-08-03 MED ORDER — INSULIN ASPART 100 UNIT/ML ~~LOC~~ SOLN
0.0000 [IU] | Freq: Three times a day (TID) | SUBCUTANEOUS | Status: DC
  Administered 2017-08-03 (×2): 7 [IU] via SUBCUTANEOUS
  Administered 2017-08-04: 4 [IU] via SUBCUTANEOUS
  Administered 2017-08-04 – 2017-08-05 (×3): 7 [IU] via SUBCUTANEOUS
  Administered 2017-08-05: 11 [IU] via SUBCUTANEOUS
  Administered 2017-08-05: 4 [IU] via SUBCUTANEOUS
  Administered 2017-08-06 (×2): 7 [IU] via SUBCUTANEOUS
  Administered 2017-08-06: 11 [IU] via SUBCUTANEOUS
  Administered 2017-08-07: 4 [IU] via SUBCUTANEOUS
  Administered 2017-08-07: 7 [IU] via SUBCUTANEOUS
  Administered 2017-08-07: 11 [IU] via SUBCUTANEOUS
  Administered 2017-08-08: 4 [IU] via SUBCUTANEOUS
  Administered 2017-08-08 (×2): 11 [IU] via SUBCUTANEOUS
  Administered 2017-08-09: 7 [IU] via SUBCUTANEOUS
  Administered 2017-08-09: 4 [IU] via SUBCUTANEOUS
  Administered 2017-08-09 – 2017-08-10 (×3): 7 [IU] via SUBCUTANEOUS
  Administered 2017-08-10 – 2017-08-11 (×2): 4 [IU] via SUBCUTANEOUS
  Administered 2017-08-11: 11 [IU] via SUBCUTANEOUS
  Administered 2017-08-11 – 2017-08-12 (×2): 7 [IU] via SUBCUTANEOUS
  Administered 2017-08-12: 11 [IU] via SUBCUTANEOUS
  Administered 2017-08-12: 7 [IU] via SUBCUTANEOUS
  Administered 2017-08-13: 4 [IU] via SUBCUTANEOUS
  Administered 2017-08-13: 7 [IU] via SUBCUTANEOUS

## 2017-08-03 MED ORDER — CARVEDILOL 25 MG PO TABS
25.0000 mg | ORAL_TABLET | Freq: Two times a day (BID) | ORAL | Status: DC
  Administered 2017-08-03 – 2017-08-13 (×21): 25 mg via ORAL
  Filled 2017-08-03 (×21): qty 1

## 2017-08-03 NOTE — ED Provider Notes (Signed)
[facility] DEPT Provider Note   CSN: [id number] Arrival date & time: 08/02/17  1856     History   Chief Complaint Chief Complaint  Patient presents with  . Chest Pain    HPI [name] is a 51 y.o. female.  The history is provided by the patient.  Chest Pain    She has a history of chronic diastolic heart failure, hypertension, diabetes, obstructive sleep apnea, Mycobacterium avium complex, and comes in complaining of shortness of breath and chest pain.  Symptoms have been worsening over the last 9 days, much worse over the last 2 days.  She gets dyspneic with walking only about 49fet, and is unable to lie flat.  Chest pain is sharp and pleuritic.  She rates pain at 7/10 currently, but it has been as severe as 10/10.  There is no associated cough, nausea, vomiting, diaphoresis.  She states she had started a new job and is unable to perform the job because of her dyspnea.  She states she has been compliant with her furosemide, although she cannot take it while working because of how often she has to go to the bathroom.  Past Medical History:  Diagnosis Date  . Bipolar disease, chronic (HCarrollwood   . Chest pain    a. 2012 Myoview: EF 63%, no isch/infarct;  b. 04/2016 Lexiscan MV: EF 73%, no ischemia/infarct-->Low risk.  . CHF (congestive heart failure) (HAguanga   . Chronic diastolic CHF (congestive heart failure) (HVolga    a. 2015 Echo: EF 55-60%, Gr2 DD;  b. 09/2015 Echo: EF 60-65%, no rwma, mod dil LA, PASP 54mg.  . Marland Kitchenistory of thyrotoxicosis   . Hypertensive heart disease 10/18/2013  . Insulin dependent type 2 diabetes mellitus, uncontrolled (HCPercy  . Mediastinal adenopathy   . MI (myocardial infarction) (HCLandover Hills  . Morbid obesity with BMI of 50.0-59.9, adult (HCCameron  . OSA (obstructive sleep apnea) 03/06/2011    Patient Active Problem List   Diagnosis Date Noted  . Mycobacterium avium complex (HCCressona03/17/2017  . Pyrexia   . Dyspnea 11/13/2015  .  Mediastinal adenopathy 11/13/2015  . Acute on chronic diastolic heart failure (HCLewis02/15/2017  . Abnormal CT scan, chest 11/11/2015  . Chest pain 11/11/2015  . Atypical chest pain 11/11/2015  . Acute diastolic CHF (congestive heart failure) (HCRegan01/01/2016  . Essential hypertension 03/07/2015  . Depression (emotion) 03/07/2015  . Noninfectious gastroenteritis and colitis 01/02/2015  . Sinusitis, chronic 01/02/2015  . Midline low back pain without sciatica 09/10/2014  . Bipolar 1 disorder, mixed, moderate (HCBloomington10/01/2014  . Stress incontinence 07/02/2014  . Mania (HCPort Wentworth03/15/2015  . Speech abnormality 12/08/2013  . SVT (supraventricular tachycardia) (HCTurnersville03/07/2014  . Essential hypertension, benign 11/28/2013  . HTN (hypertension) 11/28/2013  . Diabetes (HCFox Park03/11/2013  . Pulmonary HTN, moderate to severe 11/03/2013  . Acute on chronic diastolic congestive heart failure (HCRoutt02/01/2014  . Hypertensive heart disease 10/18/2013  . IDDM (insulin dependent diabetes mellitus) (HCEl Cerrito01/21/2015  . Chronic diastolic heart failure (HCNew Baden10/25/2012  . OSA (obstructive sleep apnea)- non compliant with C-pap 03/06/2011  . Morbid obesity due to excess calories (HCBeloit05/24/2012  . Bipolar disorder     History reviewed. No pertinent surgical history.  OB History    No data available       Home Medications    Prior to Admission medications   Medication Sig Start Date End Date Taking? Authorizing Provider  amLODipine (NORVASC) 10 MG tablet Take  1 tablet (10 mg total) by mouth daily. 03/24/17  Yes Tresa Garter, MD  carvedilol (COREG) 25 MG tablet Take 1 tablet (25 mg total) by mouth 2 (two) times daily. 04/12/17  Yes Lelon Perla, MD  furosemide (LASIX) 80 MG tablet Take 1 tablet (80 mg total) by mouth daily. Pt takes it sometimes 03/24/17  Yes Jegede, Olugbemiga E, MD  glipiZIDE (GLUCOTROL XL) 10 MG 24 hr tablet Take 1 tablet (10 mg total) by mouth daily with breakfast.  03/24/17  Yes Jegede, Olugbemiga E, MD  losartan (COZAAR) 100 MG tablet Take 1 tablet (100 mg total) by mouth daily. 03/24/17  Yes Tresa Garter, MD  metFORMIN (GLUCOPHAGE) 1000 MG tablet Take 1 tablet (1,000 mg total) by mouth 2 (two) times daily with a meal. 03/24/17  Yes Jegede, Olugbemiga E, MD  nitroGLYCERIN (NITROSTAT) 0.4 MG SL tablet Place 0.4 mg under the tongue every 5 (five) minutes as needed for chest pain.   Yes [provider]  oxybutynin (DITROPAN XL) 15 MG 24 hr tablet TAKE 1 TABLET BY MOUTH AT BEDTIME. 06/03/17  Yes Jegede, Olugbemiga E, MD  potassium chloride (K-DUR) 10 MEQ tablet Take 1 tablet (10 mEq total) by mouth 2 (two) times daily. Patient taking differently: Take 10 mEq by mouth daily.  08/06/16  Yes Tresa Garter, MD  risperiDONE (RISPERDAL) 2 MG tablet Take 2 tablets (4 mg total) by mouth daily. 08/06/16  Yes [name] E, MD  glucose blood test strip Use as instructed 01/06/17   Tresa Garter, MD    Family History Family History  Problem Relation Age of Onset  . Heart failure Father   . Stroke Father   . Hypertension Mother   . Heart disease Maternal Grandfather   . Heart disease Paternal Grandfather     Social History Social History   Tobacco Use  . Smoking status: Never Smoker  . Smokeless tobacco: Never Used  Substance Use Topics  . Alcohol use: No  . Drug use: No     Allergies   Acetaminophen; Caffeine; Farxiga [dapagliflozin]; and Lisinopril   Review of Systems Review of Systems  Cardiovascular: Positive for chest pain.  All other systems reviewed and are negative.    Physical Exam Updated Vital Signs BP 108/77   Pulse 70   Resp (!) 21   LMP 07/06/2017   SpO2 95%   Physical Exam  Nursing note and vitals reviewed.  Morbidly obese 51 year old female, resting comfortably and in no acute distress. Vital signs are significant for borderline tachypnea. Oxygen saturation is 95%, which is normal. Head is  normocephalic and atraumatic. PERRLA, EOMI. Oropharynx is clear. Neck is nontender and supple without adenopathy or JVD. Back is nontender and there is no CVA tenderness. Lungs are clear without rales, wheezes, or rhonchi. Chest is nontender. Heart has regular rate and rhythm without murmur. Abdomen is soft, flat, nontender without masses or hepatosplenomegaly and peristalsis is normoactive. Extremities have 2+ edema, full range of motion is present. Skin is warm and dry without rash. Neurologic: Mental status is normal, cranial nerves are intact, there are no motor or sensory deficits.  ED Treatments / Results  Labs (all labs ordered are listed, but only abnormal results are displayed) Labs Reviewed  COMPREHENSIVE METABOLIC PANEL - Abnormal; Notable for the following components:      Result Value   Glucose, Bld 128 (*)    ALT 11 (*)    All other components within normal  limits  BRAIN NATRIURETIC PEPTIDE - Abnormal; Notable for the following components:   B Natriuretic Peptide 250.5 (*)    All other components within normal limits  D-DIMER, QUANTITATIVE (NOT AT [facility])  I-STAT TROPONIN, ED  I-STAT BETA HCG BLOOD, ED ([facility], [facility], [facility] ONLY)    EKG  EKG Interpretation  Date/Time:  Tuesday August 03 2017 06:06:10 EST Ventricular Rate:  68 PR Interval:    QRS Duration: 84 QT Interval:  424 QTC Calculation: 451 R Axis:   95 Text Interpretation:  Sinus rhythm Probable left atrial enlargement Borderline right axis deviation Anteroseptal infarct, old When compared with ECG of 08/02/2017, No significant change was found Confirmed by Delora Fuel ([id number]) on 08/03/2017 6:12:04 AM       Radiology Dg Chest 2 View  Result Date: 08/02/2017 CLINICAL DATA:  Chest pain 1 week. EXAM: CHEST  2 VIEW COMPARISON:  07/20/2017 FINDINGS: Lungs are adequately inflated with persistent bilateral perihilar and bibasilar opacification likely interstitial edema which has slightly worse. Cannot exclude  infection in the lung bases. No evidence of effusion. Mild stable cardiomegaly. Remainder of the exam is unchanged. IMPRESSION: Mild stable cardiomegaly with findings suggesting interstitial edema slightly worse. Could not exclude infection in the lung bases. Electronically Signed   By: Marin Olp M.D.   On: 08/02/2017 19:56    Procedures Procedures (including critical care time)  Medications Ordered in ED Medications  aspirin chewable tablet 324 mg (324 mg Oral Given 08/03/17 0608)  furosemide (LASIX) injection 80 mg (80 mg Intravenous Given 08/03/17 0732)     Initial Impression / Assessment and Plan / ED Course  I have reviewed the triage vital signs and the nursing notes.  Pertinent labs & imaging results that were available during my care of the patient were reviewed by me and considered in my medical decision making (see chart for details).  Dyspnea which appears to be exacerbation of known diastolic heart failure.  Chest pain is of uncertain cause.  She has had chest pain in the past which has been worked up by cardiology.  Review of old records shows echocardiogram in January 2017 had normal LV systolic function and moderate LV hypertrophy.  Nuclear stress test in August 2017 showed ejection fraction 73% and normal perfusion.  Screening labs and x-rays will be obtained.  Because of pleuritic nature of pain, will also check d-dimer.  Chest x-ray is consistent with worsening heart failure.  D-dimer is normal.  BNP is elevated over baseline.  I feel that she will need inpatient diuresis to achieve her dry weight.  Case is discussed with Dr. Erlinda Hong of [facility] hospitalist who agrees to admit the patient.  Final Clinical Impressions(s) / ED Diagnoses   Final diagnoses:  Acute on chronic diastolic heart failure [facility])    ED Discharge Orders    None       Delora Fuel, MD 30/07/62 [phone]

## 2017-08-03 NOTE — ED Notes (Signed)
[name] [phone]. Report scheduled 0815

## 2017-08-03 NOTE — Consult Note (Signed)
Name: [name] MRN: [id number] DOB: 03/06/1966    ADMISSION DATE:  08/03/2017 CONSULTATION DATE:  08/03/17  REFERRING MD :  Dr Erlinda Hong, [facility]  CHIEF COMPLAINT: Acute on chronic dyspnea  HISTORY OF PRESENT ILLNESS: 51 year old obese [place] woman whom I have followed in office.  She has a history of an abnormal CT scan of the chest, last 12/15/16, with mediastinal and bilateral hilar lymphadenopathy and some punctate calcified bilateral granulomas.  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  She had some subsequent sputum cultures that were AFB negative.  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  Her compliance with diuretics has been marginal as she described for me at our last office visit.  She confirmed this with Dr Erlinda Hong on admission.  She has been experiencing progressive exertional limitation and dyspnea.  She underwent myocardial perfusion scan 05/21/16 that was reassuring.  She is now admitted with acute weight gain, acute on chronic exertional dyspnea, intermittent chest pain that she describes as pleuritic in nature.   PAST MEDICAL HISTORY :   has a past medical history of Bipolar disease, chronic ([place]), Chest pain, CHF (congestive heart failure) ([place]), Chronic diastolic CHF (congestive heart failure) ([place]), History of thyrotoxicosis, Hypertensive heart disease (10/18/2013), Insulin dependent type 2 diabetes mellitus, uncontrolled ([place]), Mediastinal adenopathy, MI (myocardial infarction) ([place]), Morbid obesity with BMI of 50.0-59.9, adult ([place]), and OSA (obstructive sleep apnea) (03/06/2011).  has no past surgical history on file.   Prior to Admission medications   Medication Sig Start Date End Date Taking? Authorizing Provider  amLODipine (NORVASC) 10 MG tablet Take 1 tablet  (10 mg total) by mouth daily. 03/24/17  Yes Tresa Garter, MD  carvedilol (COREG) 25 MG tablet Take 1 tablet (25 mg total) by mouth 2 (two) times daily. 04/12/17  Yes Lelon Perla, MD  furosemide (LASIX) 80 MG tablet Take 1 tablet (80 mg total) by mouth daily. Pt takes it sometimes 03/24/17  Yes Jegede, Olugbemiga E, MD  glipiZIDE (GLUCOTROL XL) 10 MG 24 hr tablet Take 1 tablet (10 mg total) by mouth daily with breakfast. 03/24/17  Yes Jegede, Olugbemiga E, MD  losartan (COZAAR) 100 MG tablet Take 1 tablet (100 mg total) by mouth daily. 03/24/17  Yes Tresa Garter, MD  metFORMIN (GLUCOPHAGE) 1000 MG tablet Take 1 tablet (1,000 mg total) by mouth 2 (two) times daily with a meal. 03/24/17  Yes Jegede, Olugbemiga E, MD  nitroGLYCERIN (NITROSTAT) 0.4 MG SL tablet Place 0.4 mg under the tongue every 5 (five) minutes as needed for chest pain.   Yes [provider]  oxybutynin (DITROPAN XL) 15 MG 24 hr tablet TAKE 1 TABLET BY MOUTH AT BEDTIME. 06/03/17  Yes Jegede, Olugbemiga E, MD  potassium chloride (K-DUR) 10 MEQ tablet Take 1 tablet (10 mEq total) by mouth 2 (two) times daily. Patient taking differently: Take 10 mEq by mouth daily.  08/06/16  Yes Tresa Garter, MD  risperiDONE (RISPERDAL) 2 MG tablet Take 2 tablets (4 mg total) by mouth daily. 08/06/16  Yes [name] E, MD  glucose blood test strip Use as instructed 01/06/17   Tresa Garter, MD   Allergies  Allergen Reactions  . Acetaminophen     Fits as a child "seizures-like"  . Caffeine     Tense, anxiety, increased urination  .  [place] [Dapagliflozin] Other (See Comments)    Hallucinations, drop in blood sugar.   . Lisinopril Rash    Rash with lisinopril; but fosinopril is ok per patient    FAMILY HISTORY:  family history includes Heart disease in her maternal grandfather and paternal grandfather; Heart failure in her father; Hypertension in her mother; Stroke in her father. SOCIAL HISTORY:   reports that  has never smoked. she has never used smokeless tobacco. She reports that she does not drink alcohol or use drugs.  REVIEW OF SYSTEMS:   As per HPI  SUBJECTIVE:  She states that she is starting to feel better, less dyspnea with reinitiation of lasix  VITAL SIGNS: Temp:  [98.3 F (36.8 C)-98.4 F (36.9 C)] 98.4 F (36.9 C) (11/06 0900) Pulse Rate:  [70-83] 71 (11/06 0900) Resp:  [18-25] 22 (11/06 0900) BP: (108-159)/(73-92) 147/73 (11/06 0900) SpO2:  [94 %-97 %] 96 % (11/06 0900) Weight:  [152 kg (335 lb)-157.7 kg (347 lb 10.7 oz)] 157.7 kg (347 lb 10.7 oz) (11/06 0900)  PHYSICAL EXAMINATION: General:  Pleasant obese woman in NAD on RA Neuro:  Awake and alert, follows all commands, well-oriented HEENT:  OP clear, no lesions Cardiovascular:  Regular, no M Lungs:  Small breaths but clear, no crackles or wheezes.  Abdomen:  Obese, soft, + BS Musculoskeletal:  1+ LE edema Skin:  No rash  Recent Labs  Lab 08/02/17 1915 08/03/17 0553 08/03/17 0921  NA 139 138  --   K 3.7 3.9  --   CL 105 103  --   CO2 26 25  --   BUN 18 16  --   CREATININE 0.67 0.60 0.61  GLUCOSE 184* 128*  --    Recent Labs  Lab 08/02/17 1915 08/03/17 0920  HGB 8.1* 7.9*  HCT 27.7* 27.2*  WBC 7.2 6.2  PLT 269 274   Dg Chest 2 View  Result Date: 08/03/2017 CLINICAL DATA:  Left-sided chest pain and shortness of breath increasing over the past 9 days. EXAM: CHEST  2 VIEW COMPARISON:  08/02/2017 FINDINGS: Cardiac enlargement with pulmonary vascular congestion. Hazy perihilar infiltrates consistent with edema. Mild progression since previous study. No focal consolidation. No blunting of costophrenic angles. No pneumothorax. Mediastinal contours appear intact. Small amount of fluid in the fissures. Old deformity of the distal right clavicle. IMPRESSION: Cardiac enlargement with pulmonary vascular congestion and perihilar edema. Mild progression since previous study. Electronically Signed   By:  Lucienne Capers M.D.   On: 08/03/2017 06:55   Dg Chest 2 View  Result Date: 08/02/2017 CLINICAL DATA:  Chest pain 1 week. EXAM: CHEST  2 VIEW COMPARISON:  07/20/2017 FINDINGS: Lungs are adequately inflated with persistent bilateral perihilar and bibasilar opacification likely interstitial edema which has slightly worse. Cannot exclude infection in the lung bases. No evidence of effusion. Mild stable cardiomegaly. Remainder of the exam is unchanged. IMPRESSION: Mild stable cardiomegaly with findings suggesting interstitial edema slightly worse. Could not exclude infection in the lung bases. Electronically Signed   By: Marin Olp M.D.   On: 08/02/2017 19:56    ASSESSMENT / PLAN:  Acute on chronic respiratory failure Probable decompensated secondary pulmonary hypertension and cor pulmonale with total body volume overload Hypertension with diastolic dysfunction Obstructive sleep apnea, not on treatment Mycobacterium avium colonization, never treated Mediastinal and hilar lymphadenopathy, suspect related to her mycobacterial disease. Last CT chest 11/2016  RECS:  - Agree with acute diuresis as her blood pressure and renal function will tolerate, then  initiate a stable home diuretic regimen.  She will need to work on her compliance with this -Tight blood pressure control -It would be reasonable to offer her empiric auto titration CPAP to see if she can tolerate during the hospitalization.  Clearly she would need to be willing to start CPAP as an outpatient to allow optimal treatment. She is willing to give it a try so I will order for tonight -Most recent CT scan of the chest was in March.  I would like to repeat her CT closer to discharge after we have had an opportunity to optimize her volume status. She agree to get this done before discharge.    Baltazar Apo, MD, PhD 08/04/2017, 11:53 AM [facility] [phone] or if no answer [phone]

## 2017-08-03 NOTE — Plan of Care (Signed)
Breathing better with increase urine output on IV lasix

## 2017-08-03 NOTE — ED Notes (Signed)
Pt given ginger-ale, [place] sandwich, and cheese stick per request. Doctor aware.

## 2017-08-03 NOTE — H&P (Addendum)
History and Physical  [name] OMA:[id number] DOB: June 11, 1966 DOA: 08/03/2017  Referring physician: EDP PCP: Tresa Garter, MD   Chief Complaint: Dyspnea  HPI: [name] is a 51 y.o. 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  She reports normally she takes Lasix 3 times a week , due to progressive dyspnea she took more Lasix this week , however symptoms did not improve he presented to the emergency room.  She denies fever, report intermittent nonproductive cough at baseline, reports intermittent pleuritic chest pain.  She finished school as Psychologist, sport and exercise, however she cannot get a job due to dyspnea on exertion.  ED course, vital signs are stable, no hypoxia at rest.  Labs remarkable for hemoglobin 8.1, glucose 128, BNP 250, troponin  0.01. Cxr: Cardiac enlargement with pulmonary vascular congestion and perihilar edema. Mild progression since previous study.   Review of Systems:  Detail per HPI, Review of systems are otherwise negative  Past Medical History:  Diagnosis Date  . Bipolar disease, chronic ([place])   . Chest pain    a. 2012 Myoview: EF 63%, no isch/infarct;  b. 04/2016 Lexiscan MV: EF 73%, no ischemia/infarct-->Low risk.  . CHF (congestive heart failure) ([place])   . Chronic diastolic CHF (congestive heart failure) ([place])    a. 2015 Echo: EF 55-60%, Gr2 DD;  b. 09/2015 Echo: EF 60-65%, no rwma, mod dil LA, PASP 50mHg.  .Marland KitchenHistory of thyrotoxicosis   . Hypertensive heart disease 10/18/2013  . Insulin dependent type 2 diabetes mellitus, uncontrolled ([place]   . Mediastinal adenopathy   . MI (myocardial infarction) ([place]   . Morbid obesity with BMI of 50.0-59.9, adult  [place]   . OSA (obstructive sleep apnea) 03/06/2011   History reviewed. No pertinent surgical history. Social History:  reports that  has never smoked. she has never used smokeless tobacco. She reports that she does not drink alcohol or use drugs. Patient lives at *& is able to participate in activities of daily living independently *  Allergies  Allergen Reactions  . Acetaminophen     Fits as a child "seizures-like"  . Caffeine     Tense, anxiety, increased urination  . FIran[Dapagliflozin] Other (See Comments)    Hallucinations, drop in blood sugar.   . Lisinopril Rash    Rash with lisinopril; but fosinopril is ok per patient    Family History  Problem Relation Age of Onset  . Heart failure Father   . Stroke Father   . Hypertension Mother   . Heart disease Maternal Grandfather   . Heart disease Paternal Grandfather       Prior to Admission medications   Medication Sig Start Date End Date Taking? Authorizing Provider  amLODipine (NORVASC) 10 MG tablet Take 1 tablet (10 mg total) by mouth daily. 03/24/17  Yes JTresa Garter MD  carvedilol (COREG) 25 MG tablet Take 1 tablet (25 mg total) by mouth 2 (two) times daily. 04/12/17  Yes CLelon Perla MD  furosemide (LASIX) 80 MG tablet Take 1 tablet (80 mg total) by mouth daily. Pt takes it sometimes 03/24/17  Yes Jegede, Olugbemiga E, MD  glipiZIDE (GLUCOTROL XL) 10 MG 24 hr tablet Take 1 tablet (10 mg total) by mouth daily with breakfast. 03/24/17  Yes JTresa Garter MD  losartan (COZAAR) 100 MG tablet Take 1 tablet (100 mg total) by mouth daily. 03/24/17  Yes Tresa Garter, MD  metFORMIN (GLUCOPHAGE) 1000 MG tablet Take 1 tablet (1,000 mg total) by mouth 2 (two) times daily with a meal. 03/24/17  Yes Jegede, Olugbemiga E, MD  nitroGLYCERIN (NITROSTAT) 0.4 MG SL tablet Place 0.4 mg under the tongue every 5 (five) minutes as needed for chest pain.   Yes [provider]  oxybutynin (DITROPAN XL) 15 MG 24 hr  tablet TAKE 1 TABLET BY MOUTH AT BEDTIME. 06/03/17  Yes Jegede, Olugbemiga E, MD  potassium chloride (K-DUR) 10 MEQ tablet Take 1 tablet (10 mEq total) by mouth 2 (two) times daily. Patient taking differently: Take 10 mEq by mouth daily.  08/06/16  Yes Tresa Garter, MD  risperiDONE (RISPERDAL) 2 MG tablet Take 2 tablets (4 mg total) by mouth daily. 08/06/16  Yes Tresa Garter, MD  glucose blood test strip Use as instructed 01/06/17   Tresa Garter, MD    Physical Exam: BP (!) 159/92   Pulse 77   Resp 20   LMP 07/06/2017   SpO2 94%   General:  * Eyes: PERRL ENT: unremarkable Neck: supple, no JVD Cardiovascular: RRR Respiratory: CTABL Abdomen: soft/ND/ND, positive bowel sounds Skin: no rash Musculoskeletal:  No edema Psychiatric: calm/cooperative Neurologic: no focal findings  *          Labs on Admission:  Basic Metabolic Panel: Recent Labs  Lab 08/02/17 1915 08/03/17 0553  NA 139 138  K 3.7 3.9  CL 105 103  CO2 26 25  GLUCOSE 184* 128*  BUN 18 16  CREATININE 0.67 0.60  CALCIUM 9.2 9.0   Liver Function Tests: Recent Labs  Lab 08/03/17 0553  AST 17  ALT 11*  ALKPHOS 103  BILITOT 0.5  PROT 7.3  ALBUMIN 3.5   No results for input(s): LIPASE, AMYLASE in the last 168 hours. No results for input(s): AMMONIA in the last 168 hours. CBC: Recent Labs  Lab 08/02/17 1915  WBC 7.2  HGB 8.1*  HCT 27.7*  MCV 72.3*  PLT 269   Cardiac Enzymes: No results for input(s): CKTOTAL, CKMB, CKMBINDEX, TROPONINI in the last 168 hours.  BNP (last 3 results) Recent Labs    12/03/16 1115 08/03/17 0553  BNP 204.5* 250.5*    ProBNP (last 3 results) No results for input(s): PROBNP in the last 8760 hours.  CBG: No results for input(s): GLUCAP in the last 168 hours.  Radiological Exams on Admission: Dg Chest 2 View  Result Date: 08/03/2017 CLINICAL DATA:  Left-sided chest pain and shortness of breath increasing over the past 9 days. EXAM: CHEST  2  VIEW COMPARISON:  08/02/2017 FINDINGS: Cardiac enlargement with pulmonary vascular congestion. Hazy perihilar infiltrates consistent with edema. Mild progression since previous study. No focal consolidation. No blunting of costophrenic angles. No pneumothorax. Mediastinal contours appear intact. Small amount of fluid in the fissures. Old deformity of the distal right clavicle. IMPRESSION: Cardiac enlargement with pulmonary vascular congestion and perihilar edema. Mild progression since previous study. Electronically Signed   By: Lucienne Capers M.D.   On: 08/03/2017 06:55   Dg Chest 2 View  Result Date: 08/02/2017 CLINICAL DATA:  Chest pain 1 week. EXAM: CHEST  2 VIEW COMPARISON:  07/20/2017 FINDINGS: Lungs are adequately inflated with persistent bilateral perihilar and bibasilar opacification likely interstitial edema which has slightly worse. Cannot exclude infection in the lung bases. No evidence of effusion. Mild stable cardiomegaly.  Remainder of the exam is unchanged. IMPRESSION: Mild stable cardiomegaly with findings suggesting interstitial edema slightly worse. Could not exclude infection in the lung bases. Electronically Signed   By: Marin Olp M.D.   On: 08/02/2017 19:56    EKG: Independently reviewed.  Sinus rhythm, no acute ST-T changes, possible left atrial enlargement, QTC 450  Assessment/Plan Present on Admission: **None**  Acute on chronic dchf exacerbation: Echocardiogram in 2017 with preserved left ventricular EF Stress test in August 2017 unremarkable She is noncompliant with Lasix Start Lasix 80 mg twice a day Daily weight strict intake and output  Anemia: chronic hgb 8.1 on presentation, could be due to hemodilution in the setting of volume overload Anemia workup Consider transfusion if remain dyspnea and volume overload corrected.  noninsulin  Dependent dm2 Hold metformin and glipizide Start SSI Last A1c in June 10.2, repeat A1c.  HTN Continue Coreg Cozaar,  hold Norvasc, increased dose of Lasix  Mediastinal adenopathy/Mycobacterium avium complex (HCC) Not currently treated, ace wnl, negative quantiferon test, negative HIV, followed by Dr Lamonte Sakai,  If dyspnea does not improve with diuresis, may need to contact Dr Lamonte Sakai.   Addendum: I have contacted Dr Lamonte Sakai who graciously accepted the consult and will see patient. He recommends continue diurese.   obesity/OSA (obstructive sleep apnea)- non compliant with C-pap, followed by Dr Lamonte Sakai   Depression/anxiety: stable on risperdal   DVT prophylaxis: lovenox  Consultants:  None  Code Status: full   Family Communication:  Patient   Disposition Plan: med tele obs  Time spent: 57mns  Sarinah Doetsch MD, PhD [facility] Pager [phone] 7PM-7AM, please contact night-coverage at [email or web address], password [email or web address] [place]

## 2017-08-04 DIAGNOSIS — I2781 Cor pulmonale (chronic): Secondary | ICD-10-CM

## 2017-08-04 LAB — BASIC METABOLIC PANEL
Anion gap: 10 (ref 5–15)
BUN: 23 mg/dL — AB (ref 6–20)
CALCIUM: 9.2 mg/dL (ref 8.9–10.3)
CO2: 26 mmol/L (ref 22–32)
CREATININE: 0.8 mg/dL (ref 0.44–1.00)
Chloride: 103 mmol/L (ref 101–111)
GFR calc Af Amer: >60 mL/min (ref >60)
GLUCOSE: 212 mg/dL — AB (ref 65–99)
POTASSIUM: 3.9 mmol/L (ref 3.5–5.1)
SODIUM: 139 mmol/L (ref 135–145)

## 2017-08-04 LAB — GLUCOSE, CAPILLARY
GLUCOSE-CAPILLARY: 225 mg/dL — AB (ref 65–99)
Glucose-Capillary: 187 mg/dL — ABNORMAL HIGH (ref 65–99)
Glucose-Capillary: 201 mg/dL — ABNORMAL HIGH (ref 65–99)
Glucose-Capillary: 194 mg/dL — ABNORMAL HIGH (ref 65–99)

## 2017-08-04 LAB — IRON AND TIBC
Iron: 22 ug/dL — ABNORMAL LOW (ref 28–170)
SATURATION RATIOS: 4 % — AB (ref 10.4–31.8)
TIBC: 491 ug/dL — ABNORMAL HIGH (ref 250–450)
UIBC: 469 ug/dL

## 2017-08-04 LAB — RETICULOCYTES
RBC.: 3.84 MIL/uL — ABNORMAL LOW (ref 3.87–5.11)
RETIC COUNT ABSOLUTE: 96 10*3/uL (ref 19.0–186.0)
Retic Ct Pct: 2.5 % (ref 0.4–3.1)

## 2017-08-04 LAB — CBC
HCT: 27.5 % — ABNORMAL LOW (ref 36.0–46.0)
Hemoglobin: 8 g/dL — ABNORMAL LOW (ref 12.0–15.0)
MCH: 20.8 pg — AB (ref 26.0–34.0)
MCHC: 29.1 g/dL — AB (ref 30.0–36.0)
MCV: 71.6 fL — AB (ref 78.0–100.0)
PLATELETS: 257 10*3/uL (ref 150–400)
RBC: 3.84 MIL/uL — ABNORMAL LOW (ref 3.87–5.11)
RDW: 18.4 % — AB (ref 11.5–15.5)
WBC: 6.3 10*3/uL (ref 4.0–10.5)

## 2017-08-04 LAB — HIV ANTIBODY (ROUTINE TESTING W REFLEX): HIV SCREEN 4TH GENERATION: NONREACTIVE

## 2017-08-04 LAB — TSH: TSH: 4.181 u[IU]/mL (ref 0.350–4.500)

## 2017-08-04 LAB — FOLATE: Folate: 9.5 ng/mL (ref >5.9)

## 2017-08-04 LAB — VITAMIN B12: VITAMIN B 12: 160 pg/mL — AB (ref 180–914)

## 2017-08-04 LAB — T4, FREE: Free T4: 1.03 ng/dL (ref 0.61–1.12)

## 2017-08-04 LAB — MAGNESIUM: MAGNESIUM: 1.5 mg/dL — AB (ref 1.7–2.4)

## 2017-08-04 MED ORDER — FERROUS SULFATE 325 (65 FE) MG PO TABS
325.0000 mg | ORAL_TABLET | Freq: Two times a day (BID) | ORAL | Status: DC
  Administered 2017-08-04 – 2017-08-05 (×2): 325 mg via ORAL
  Filled 2017-08-04 (×2): qty 1

## 2017-08-04 MED ORDER — OXYBUTYNIN CHLORIDE ER 5 MG PO TB24
15.0000 mg | ORAL_TABLET | Freq: Every day | ORAL | Status: DC
  Administered 2017-08-04 – 2017-08-12 (×9): 15 mg via ORAL
  Filled 2017-08-04 (×9): qty 3

## 2017-08-04 MED ORDER — HYDROCODONE-ACETAMINOPHEN 5-325 MG PO TABS: 1.0000 | ORAL_TABLET | Freq: Four times a day (QID) | ORAL | Status: DC | PRN

## 2017-08-04 MED ORDER — MAGNESIUM SULFATE 2 GM/50ML IV SOLN
2.0000 g | Freq: Once | INTRAVENOUS | Status: AC
  Administered 2017-08-04: 2 g via INTRAVENOUS
  Filled 2017-08-04: qty 50

## 2017-08-04 MED ORDER — OXYCODONE HCL 5 MG PO TABS
5.0000 mg | ORAL_TABLET | ORAL | Status: DC | PRN
  Administered 2017-08-04 – 2017-08-06 (×2): 5 mg via ORAL
  Filled 2017-08-04 (×2): qty 1

## 2017-08-04 NOTE — Progress Notes (Signed)
Patient ID: [name], female   DOB: Feb 25, 1966, 51 y.o.   MRN: [id number]    PROGRESS NOTE   Jazzy Parmer  KXF:[id number] DOB: [birth year] DOA: 08/03/2017  PCP: Tresa Garter, MD   Brief Narrative:  Patient is 51 year old female with known sleep apnea, noncompliance with CPAP, non-insulin-dependent type 2 diabetes, hypertension, mediastinal adenopathy, SMAC, not on treatment, diastolic CHF with noncompliance with Lasix, presented to emergency department with worsening dyspnea, 3-4 pillow orthopnea and weight gain.  Assessment & Plan:  Acute on chronic dchf exacerbation: - Echocardiogram in 2017 with preserved left ventricular EF - Stress test in August 2017 unremarkable - She is noncompliant with Lasix - Has been on Lasix 80 mg IV twice a day, we'll continue same regimen - Weight trend since admission Filed Weights   08/03/17 0900 08/04/17 0459  Weight: (!) 157.7 kg (347 lb 10.7 oz) (!) 157.7 kg (347 lb 10.7 oz)  - monitor daily weights, strict intake and output  Anemia of chronic disease - No evidence of active bleeding, hemoglobin overall stable around 8 - Iron level XXII, oral supplementation started - CBC in the morning  Non-insulin-dependent DM type II - Last A1c in June 10.2 - Will repeat A1c - Keep on sliding scale insulin for now   Hypomagnesemia - supplement and repeat Mg in AM  HTN - Blood pressure overall stable  - Continue Coreg, Cozaar, Lasix  - Norvasc held    Mediastinal adenopathy/Mycobacterium avium complex (HCC) - Not currently treated, ace wnl, negative quantiferon test, negative HIV, followed by Dr Lamonte Sakai,   OSA - CPCP at night time  Depression/anxiety: - stable   DVT prophylaxis: Lovenox SQ Code Status: Full  Family Communication: Patient at bedside  Disposition Plan: to be determined   Consultants:   PCCM   Procedures:   None  Antimicrobials:   None  Subjective: Pt reports feeling better.   Objective: Vitals:     08/03/17 0900 08/03/17 1529 08/03/17 2027 08/04/17 0459  BP: (!) 147/73 (!) 154/89 114/79 133/71  Pulse: 71 72 68 70  Resp: (!) 22 20 20 20   Temp: 98.4 F (36.9 C) 98.1 F (36.7 C) 98 F (36.7 C) 97.7 F (36.5 C)  TempSrc: Oral Oral Oral Axillary  SpO2: 96% 93% 92% 98%  Weight: (!) 157.7 kg (347 lb 10.7 oz)   (!) 157.7 kg (347 lb 10.7 oz)  Height: 5' 7"  (1.702 m)       Intake/Output Summary (Last 24 hours) at 08/04/2017 0835 Last data filed at 08/04/2017 0811 Gross per 24 hour  Intake 240 ml  Output 1800 ml  Net -1560 ml   Filed Weights   08/03/17 0900 08/04/17 0459  Weight: (!) 157.7 kg (347 lb 10.7 oz) (!) 157.7 kg (347 lb 10.7 oz)    Examination:  General exam: Appears calm and comfortable  Respiratory system: Diminished breath sounds at bases  Cardiovascular system: S1 & S2 heard, RRR. No rubs, gallops or clicks. No pedal edema. Gastrointestinal system: Abdomen is nondistended, soft and nontender. No organomegaly or masses felt. Normal bowel sounds heard. Central nervous system: Alert and oriented. No focal neurological deficits. Extremities: Symmetric 5 x 5 power. +1 bilateral LE edema  Psychiatry: Judgement and insight appear normal. Mood & affect appropriate.   Data Reviewed: I have personally reviewed following labs and imaging studies  CBC: Recent Labs  Lab 08/02/17 1915 08/03/17 0920 08/04/17 0450  WBC 7.2 6.2 6.3  HGB 8.1* 7.9* 8.0*  HCT 27.7* 27.2* 27.5*  MCV 72.3* 71.8* 71.6*  PLT 269 274 149   Basic Metabolic Panel: Recent Labs  Lab 08/02/17 1915 08/03/17 0553 08/03/17 0921 08/04/17 0450  NA 139 138  --  139  K 3.7 3.9  --  3.9  CL 105 103  --  103  CO2 26 25  --  26  GLUCOSE 184* 128*  --  212*  BUN 18 16  --  23*  CREATININE 0.67 0.60 0.61 0.80  CALCIUM 9.2 9.0  --  9.2  MG  --   --   --  1.5*   Liver Function Tests: Recent Labs  Lab 08/03/17 0553  AST 17  ALT 11*  ALKPHOS 103  BILITOT 0.5  PROT 7.3  ALBUMIN 3.5    CBG: Recent Labs  Lab 08/03/17 0951 08/03/17 1210 08/03/17 1709 08/03/17 2121 08/04/17 0721  GLUCAP 158* 218* 219* 158* 187*   Thyroid Function Tests: Recent Labs    08/04/17 0450  TSH 4.181   Anemia Panel: Recent Labs    08/04/17 0450  RETICCTPCT 2.5   Urine analysis:    Component Value Date/Time   COLORURINE YELLOW 08/06/2016 1245   APPEARANCEUR TURBID (A) 08/06/2016 1245   LABSPEC 1.028 08/06/2016 1245   PHURINE 5.5 08/06/2016 1245   GLUCOSEU 2+ (A) 08/06/2016 1245   HGBUR NEGATIVE 08/06/2016 1245   BILIRUBINUR NEGATIVE 08/06/2016 1245   BILIRUBINUR neg 09/10/2014 1219   KETONESUR NEGATIVE 08/06/2016 1245   PROTEINUR 2+ (A) 08/06/2016 1245   UROBILINOGEN 1.0 09/10/2014 1219   UROBILINOGEN 0.2 12/07/2013 0041   NITRITE NEGATIVE 08/06/2016 1245   LEUKOCYTESUR NEGATIVE 08/06/2016 1245     Radiology Studies: Dg Chest 2 View  Result Date: 08/03/2017 CLINICAL DATA:  Left-sided chest pain and shortness of breath increasing over the past 9 days. EXAM: CHEST  2 VIEW COMPARISON:  08/02/2017 FINDINGS: Cardiac enlargement with pulmonary vascular congestion. Hazy perihilar infiltrates consistent with edema. Mild progression since previous study. No focal consolidation. No blunting of costophrenic angles. No pneumothorax. Mediastinal contours appear intact. Small amount of fluid in the fissures. Old deformity of the distal right clavicle. IMPRESSION: Cardiac enlargement with pulmonary vascular congestion and perihilar edema. Mild progression since previous study. Electronically Signed   By: Lucienne Capers M.D.   On: 08/03/2017 06:55   Dg Chest 2 View  Result Date: 08/02/2017 CLINICAL DATA:  Chest pain 1 week. EXAM: CHEST  2 VIEW COMPARISON:  07/20/2017 FINDINGS: Lungs are adequately inflated with persistent bilateral perihilar and bibasilar opacification likely interstitial edema which has slightly worse. Cannot exclude infection in the lung bases. No evidence of effusion.  Mild stable cardiomegaly. Remainder of the exam is unchanged. IMPRESSION: Mild stable cardiomegaly with findings suggesting interstitial edema slightly worse. Could not exclude infection in the lung bases. Electronically Signed   By: Marin Olp M.D.   On: 08/02/2017 19:56    Scheduled Meds: . aspirin EC  81 mg Oral Daily  . carvedilol  25 mg Oral BID  . enoxaparin (LOVENOX) injection  70 mg Subcutaneous Q24H  . furosemide  80 mg Intravenous Q12H  . insulin aspart  0-20 Units Subcutaneous TID WC  . losartan  100 mg Oral Daily  . risperiDONE  4 mg Oral Daily  . senna-docusate  1 tablet Oral BID   Continuous Infusions:   LOS: 0 days   Time spent: 25 minutes   Faye Ramsay, MD [facility] Pager [phone]  If 7PM-7AM, please contact night-coverage [email or web address] Password [email or web address] 08/04/2017, 8:35 AM

## 2017-08-05 ENCOUNTER — Inpatient Hospital Stay (HOSPITAL_COMMUNITY): Payer: Self-pay

## 2017-08-05 DIAGNOSIS — A319 Mycobacterial infection, unspecified: Secondary | ICD-10-CM

## 2017-08-05 LAB — CBC
HCT: 28.6 % — ABNORMAL LOW (ref 36.0–46.0)
Hemoglobin: 8.1 g/dL — ABNORMAL LOW (ref 12.0–15.0)
MCH: 20.4 pg — AB (ref 26.0–34.0)
MCHC: 28.3 g/dL — ABNORMAL LOW (ref 30.0–36.0)
MCV: 72 fL — AB (ref 78.0–100.0)
PLATELETS: 259 10*3/uL (ref 150–400)
RBC: 3.97 MIL/uL (ref 3.87–5.11)
RDW: 18.3 % — AB (ref 11.5–15.5)
WBC: 6.4 10*3/uL (ref 4.0–10.5)

## 2017-08-05 LAB — BASIC METABOLIC PANEL
Anion gap: 10 (ref 5–15)
BUN: 25 mg/dL — AB (ref 6–20)
CALCIUM: 8.9 mg/dL (ref 8.9–10.3)
CO2: 26 mmol/L (ref 22–32)
Chloride: 101 mmol/L (ref 101–111)
Creatinine, Ser: 0.77 mg/dL (ref 0.44–1.00)
GFR calc Af Amer: >60 mL/min (ref >60)
GLUCOSE: 221 mg/dL — AB (ref 65–99)
Potassium: 3.8 mmol/L (ref 3.5–5.1)
Sodium: 137 mmol/L (ref 135–145)

## 2017-08-05 LAB — GLUCOSE, CAPILLARY
GLUCOSE-CAPILLARY: 195 mg/dL — AB (ref 65–99)
Glucose-Capillary: 232 mg/dL — ABNORMAL HIGH (ref 65–99)
Glucose-Capillary: 300 mg/dL — ABNORMAL HIGH (ref 65–99)

## 2017-08-05 LAB — MAGNESIUM: Magnesium: 1.6 mg/dL — ABNORMAL LOW (ref 1.7–2.4)

## 2017-08-05 MED ORDER — FERROUS SULFATE 325 (65 FE) MG PO TABS
325.0000 mg | ORAL_TABLET | Freq: Three times a day (TID) | ORAL | Status: DC
  Administered 2017-08-05 – 2017-08-13 (×26): 325 mg via ORAL
  Filled 2017-08-05 (×25): qty 1

## 2017-08-05 NOTE — Care Management Note (Signed)
Case Management Note  Patient Details  Name: [name] MRN: [id number] Date of Birth: 05/06/1966  Subjective/Objective:    Pt admitted with Acute on chronic diastolic CHF. Pt has a hx of Bipolar disease, chronic.           Action/Plan: Plan to discharge home with no needs at present time. Pt states she will not need HH.    Expected Discharge Date:  (unknown)               Expected Discharge Plan:  Home/Self Care  In-House Referral:     Discharge planning Services  CM Consult  Post Acute Care Choice:    Choice offered to:  Patient  DME Arranged:    DME Agency:     HH Arranged:  (N/A) [place] Agency:     Status of Service:  Completed, signed off  If discussed at [place] of Stay Meetings, dates discussed:    Additional CommentsPurcell Mouton, RN 08/05/2017, 1:49 PM

## 2017-08-05 NOTE — Progress Notes (Signed)
Patient ID: [name], female   DOB: 15-Mar-1966, 51 y.o.   MRN: [id number]    PROGRESS NOTE   [name]  [id number]:[id number] DOB: Nov 09, 1965 DOA: 08/03/2017  PCP: Tresa Garter, MD   Brief Narrative:  Patient is 51 year old female with known sleep apnea, noncompliance with CPAP, non-insulin-dependent type 2 diabetes, hypertension, mediastinal adenopathy, SMAC, not on treatment, diastolic CHF with noncompliance with Lasix, presented to emergency department with worsening dyspnea, 3-4 pillow orthopnea and weight gain.  Assessment & Plan:  Acute on chronic dchf exacerbation: - Echocardiogram in 2017 with preserved left ventricular EF - Stress test in August 2017 unremarkable - She is noncompliant with Lasix - Has been on Lasix 80 mg twice a day IV, continue same regimen - Weight trend since admission Filed Weights   08/03/17 0900 08/04/17 0459 08/05/17 0529  Weight: (!) 157.7 kg (347 lb 10.7 oz) (!) 157.7 kg (347 lb 10.7 oz) (!) 158.1 kg (348 lb 8.8 oz)  - monitor daily weights, strict I/O  Anemia of chronic disease - No evidence of active bleeding - Iron levels low, oral supplementation already started - Myoglobin overall stable. - CBC in the morning  Non-insulin-dependent DM type II - Last A1c in June 10.2 - Repeat A1c pending - Continue sliding scale insulin  Hypomagnesemia - Has been supplemented, repeat MG level pending this morning  HTN - Reasonable inpatient control - Continue Cozaar, Coreg, Lasix  Mediastinal adenopathy/Mycobacterium avium complex (HCC) - Not currently treated, ace wnl, negative quantiferon test, negative HIV, followed by Dr Lamonte Sakai,   OSA - CPAP at nighttime  Depression/anxiety: - stable   DVT prophylaxis: Lovenox SQ Code Status: Full  Family Communication: Patient at bedside Disposition Plan: to be determined   Consultants:   PCCM   Procedures:   None  Antimicrobials:   None  Subjective: Patient reports feeling  better this morning, denies chest pain or shortness of breath. Still with 3-4 pillow orthopnea.  Objective: Vitals:   08/04/17 0459 08/04/17 1500 08/04/17 2116 08/05/17 0529  BP: 133/71 125/73 121/85 132/67  Pulse: 70 72 60 66  Resp: _0 Temp: 97.7 F (36.5 C) 98.5 F (36.9 C) 98 F (36.7 C) 98 F (36.7 C)  TempSrc: Axillary Oral Oral Oral  SpO2: 98% 98% 95% 100%  Weight: (!) 157.7 kg (347 lb 10.7 oz)   (!) 158.1 kg (348 lb 8.8 oz)  Height:        Intake/Output Summary (Last 24 hours) at 08/05/2017 0833 Last data filed at 08/05/2017 0533 Gross per 24 hour  Intake 480 ml  Output 2500 ml  Net -2020 ml   Filed Weights   08/03/17 0900 08/04/17 0459 08/05/17 0529  Weight: (!) 157.7 kg (347 lb 10.7 oz) (!) 157.7 kg (347 lb 10.7 oz) (!) 158.1 kg (348 lb 8.8 oz)    Physical Exam  Constitutional: Appears calm, NAD CVS: RRR, S1/S2 +, no murmurs, no gallops, no carotid bruit.  Pulmonary: Effort and breath sounds normal, no stridor, rhonchi, wheezes, rales.  Abdominal: Soft. BS +,  no distension, tenderness, rebound or guarding.  Musculoskeletal: Normal range of motion. +1 bilateral LE edema  Neuro: Alert. Normal reflexes, muscle tone coordination. No cranial nerve deficit. Skin: Skin is warm and dry. No rash noted. Not diaphoretic. No erythema. No pallor.  Psychiatric: Normal mood and affect. Behavior, judgment, thought content normal.   Data Reviewed: I have personally reviewed following labs and imaging studies  CBC: Recent Labs  Lab 08/02/17  1915 08/03/17 0920 08/04/17 0450 08/05/17 0418  WBC 7.2 6.2 6.3 6.4  HGB 8.1* 7.9* 8.0* 8.1*  HCT 27.7* 27.2* 27.5* 28.6*  MCV 72.3* 71.8* 71.6* 72.0*  PLT 269 274 257 161   Basic Metabolic Panel: Recent Labs  Lab 08/02/17 1915 08/03/17 0553 08/03/17 0921 08/04/17 0450 08/05/17 0418  NA 139 138  --  139 137  K 3.7 3.9  --  3.9 3.8  CL 105 103  --  103 101  CO2 26 25  --  26 26  GLUCOSE 184* 128*  --  212* 221*    BUN 18 16  --  23* 25*  CREATININE 0.67 0.60 0.61 0.80 0.77  CALCIUM 9.2 9.0  --  9.2 8.9  MG  --   --   --  1.5*  --    Liver Function Tests: Recent Labs  Lab 08/03/17 0553  AST 17  ALT 11*  ALKPHOS 103  BILITOT 0.5  PROT 7.3  ALBUMIN 3.5   CBG: Recent Labs  Lab 08/04/17 0721 08/04/17 1156 08/04/17 1618 08/04/17 2120 08/05/17 0736  GLUCAP 187* 201* 225* 194* 195*   Thyroid Function Tests: Recent Labs    08/04/17 0450  TSH 4.181  FREET4 1.03   Anemia Panel: Recent Labs    08/04/17 0450  VITAMINB12 160*  FOLATE 9.5  TIBC 491*  IRON 22*  RETICCTPCT 2.5   Urine analysis:    Component Value Date/Time   COLORURINE YELLOW 08/06/2016 1245   APPEARANCEUR TURBID (A) 08/06/2016 1245   LABSPEC 1.028 08/06/2016 1245   PHURINE 5.5 08/06/2016 1245   GLUCOSEU 2+ (A) 08/06/2016 1245   HGBUR NEGATIVE 08/06/2016 1245   BILIRUBINUR NEGATIVE 08/06/2016 1245   BILIRUBINUR neg 09/10/2014 [place] 08/06/2016 1245   PROTEINUR 2+ (A) 08/06/2016 1245   UROBILINOGEN 1.0 09/10/2014 1219   UROBILINOGEN 0.2 12/07/2013 0041   NITRITE NEGATIVE 08/06/2016 1245   LEUKOCYTESUR NEGATIVE 08/06/2016 1245    Radiology Studies: No results found.  Scheduled Meds: . aspirin EC  81 mg Oral Daily  . carvedilol  25 mg Oral BID  . enoxaparin (LOVENOX) injection  70 mg Subcutaneous Q24H  . ferrous sulfate  325 mg Oral BID WC  . furosemide  80 mg Intravenous Q12H  . insulin aspart  0-20 Units Subcutaneous TID WC  . losartan  100 mg Oral Daily  . oxybutynin  15 mg Oral QHS  . risperiDONE  4 mg Oral Daily  . senna-docusate  1 tablet Oral BID   Continuous Infusions:   LOS: 1 day   Time spent: 25 minutes   Faye Ramsay, MD [facility] Pager [phone]  If 7PM-7AM, please contact night-coverage [email or web address] Password [email or web address] 08/05/2017, 8:33 AM

## 2017-08-05 NOTE — Consult Note (Signed)
Name: [name] MRN: [id number] DOB: 04/29/1966    ADMISSION DATE:  08/03/2017 CONSULTATION DATE:  08/03/17  REFERRING MD :  Dr Erlinda Hong, [facility]  CHIEF COMPLAINT: Acute on chronic dyspnea  HISTORY OF PRESENT ILLNESS: 51 year old obese [place] woman whom I have followed in office.  She has a history of an abnormal CT scan of the chest, last 12/15/16, with mediastinal and bilateral hilar lymphadenopathy and some punctate calcified bilateral granulomas.  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  She had some subsequent sputum cultures that were AFB negative.  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  Her compliance with diuretics has been marginal as she described for me at our last office visit.  She confirmed this with Dr Erlinda Hong on admission.  She has been experiencing progressive exertional limitation and dyspnea.  She underwent myocardial perfusion scan 05/21/16 that was reassuring.  She is now admitted with acute weight gain, acute on chronic exertional dyspnea, intermittent chest pain that she describes as pleuritic in nature.   SUBJECTIVE:  Continues to feel better She is now 5 liters negative for hospitalization She was never brought CPAP to try last night  VITAL SIGNS: Temp:  [98 F (36.7 C)-98.5 F (36.9 C)] 98 F (36.7 C) (11/08 0529) Pulse Rate:  [60-72] 66 (11/08 0529) Resp:  [18-20] 20 (11/08 0529) BP: (121-132)/(67-85) 132/67 (11/08 0529) SpO2:  [95 %-100 %] 100 % (11/08 0529) Weight:  [158.1 kg (348 lb 8.8 oz)] 158.1 kg (348 lb 8.8 oz) (11/08 0529)  PHYSICAL EXAMINATION: General:  Pleasant obese woman in NAD on RA, up to chair Neuro:  Awake and alert, follows all commands, well-oriented HEENT:  OP clear, no lesions Cardiovascular:  Regular, no M Lungs:  Small  breaths but clear, no crackles or wheezes.  Abdomen:  Obese, soft, + BS Musculoskeletal: Edema is improved bilateral lower extremities, still a tiny bit of present Skin:  No rash  Recent Labs  Lab 08/03/17 0553 08/03/17 0921 08/04/17 0450 08/05/17 0418  NA 138  --  139 137  K 3.9  --  3.9 3.8  CL 103  --  103 101  CO2 25  --  26 26  BUN 16  --  23* 25*  CREATININE 0.60 0.61 0.80 0.77  GLUCOSE 128*  --  212* 221*   Recent Labs  Lab 08/03/17 0920 08/04/17 0450 08/05/17 0418  HGB 7.9* 8.0* 8.1*  HCT 27.2* 27.5* 28.6*  WBC 6.2 6.3 6.4  PLT 274 257 259   No results found.  ASSESSMENT / PLAN:  Acute on chronic respiratory failure Probable decompensated secondary pulmonary hypertension and cor pulmonale with total body volume overload Hypertension with diastolic dysfunction Obstructive sleep apnea, not on treatment Mycobacterium avium colonization, never treated Mediastinal and hilar lymphadenopathy, suspect related to her mycobacterial disease. Last CT chest 11/2016  RECS:  -Continue diuresis as she can tolerate.  Discussed with her the importance of complying with her outpatient diuretic regimen -Tight blood pressure control -She was never brought CPAP last night.  I would like to get her started on auto titration mode to see if she tolerates.  She needs to be started on outpatient CPAP.  Continue to discuss with her whether she is to do so. -I will order a high-resolution CT scan of the chest to compare with her prior  to follow for evidence active [facility]   Baltazar Apo, MD, PhD 08/05/2017, 1:12 PM [facility] [phone] or if no answer [phone]

## 2017-08-05 NOTE — Progress Notes (Signed)
RT placed Pt on Nasal CPAP but pt became very anxious and ripped it off.  Pt states that she can't tolerate it at this time.  Pt states that she hasn't worn CPAP in 3 years at home.  Pt states that she will attempt again tomorrow night.  Machine is at bedside if pt changes her mind.  RT to monitor and assess as needed.

## 2017-08-06 ENCOUNTER — Inpatient Hospital Stay (HOSPITAL_COMMUNITY): Payer: No Typology Code available for payment source

## 2017-08-06 DIAGNOSIS — I10 Essential (primary) hypertension: Secondary | ICD-10-CM

## 2017-08-06 DIAGNOSIS — J9621 Acute and chronic respiratory failure with hypoxia: Secondary | ICD-10-CM

## 2017-08-06 DIAGNOSIS — R9431 Abnormal electrocardiogram [ECG] [EKG]: Secondary | ICD-10-CM

## 2017-08-06 LAB — ECHOCARDIOGRAM COMPLETE
HEIGHTINCHES: 67 in
WEIGHTICAEL: 5580.28 [oz_av]

## 2017-08-06 LAB — GLUCOSE, CAPILLARY
GLUCOSE-CAPILLARY: 240 mg/dL — AB (ref 65–99)
GLUCOSE-CAPILLARY: 274 mg/dL — AB (ref 65–99)
Glucose-Capillary: 220 mg/dL — ABNORMAL HIGH (ref 65–99)

## 2017-08-06 LAB — BASIC METABOLIC PANEL
Anion gap: 9 (ref 5–15)
BUN: 27 mg/dL — AB (ref 6–20)
CHLORIDE: 101 mmol/L (ref 101–111)
CO2: 27 mmol/L (ref 22–32)
CREATININE: 0.83 mg/dL (ref 0.44–1.00)
Calcium: 8.9 mg/dL (ref 8.9–10.3)
GFR calc Af Amer: >60 mL/min (ref >60)
GFR calc non Af Amer: >60 mL/min (ref >60)
GLUCOSE: 274 mg/dL — AB (ref 65–99)
POTASSIUM: 3.7 mmol/L (ref 3.5–5.1)
SODIUM: 137 mmol/L (ref 135–145)

## 2017-08-06 LAB — CBC
HEMATOCRIT: 26.9 % — AB (ref 36.0–46.0)
HEMOGLOBIN: 7.7 g/dL — AB (ref 12.0–15.0)
MCH: 20.6 pg — ABNORMAL LOW (ref 26.0–34.0)
MCHC: 28.6 g/dL — AB (ref 30.0–36.0)
MCV: 71.9 fL — ABNORMAL LOW (ref 78.0–100.0)
Platelets: 246 10*3/uL (ref 150–400)
RBC: 3.74 MIL/uL — AB (ref 3.87–5.11)
RDW: 18.6 % — ABNORMAL HIGH (ref 11.5–15.5)
WBC: 7.2 10*3/uL (ref 4.0–10.5)

## 2017-08-06 LAB — HEMOGLOBIN A1C
Hgb A1c MFr Bld: 9.7 % — ABNORMAL HIGH (ref 4.8–5.6)
Mean Plasma Glucose: 231.69 mg/dL

## 2017-08-06 LAB — MAGNESIUM: MAGNESIUM: 1.7 mg/dL (ref 1.7–2.4)

## 2017-08-06 MED ORDER — INSULIN GLARGINE 100 UNIT/ML ~~LOC~~ SOLN
10.0000 [IU] | Freq: Every day | SUBCUTANEOUS | Status: DC
  Administered 2017-08-06 – 2017-08-07 (×2): 10 [IU] via SUBCUTANEOUS
  Filled 2017-08-06 (×2): qty 0.1

## 2017-08-06 NOTE — Progress Notes (Signed)
Name: [name] MRN: [id number] DOB: [birth year]    ADMISSION DATE:  08/03/2017 CONSULTATION DATE:  08/03/17  REFERRING MD :  Dr Erlinda Hong, [facility]  CHIEF COMPLAINT: Acute on chronic dyspnea  HISTORY OF PRESENT ILLNESS: 51 year old obese [place] woman whom I have followed in office.  She has a history of an abnormal CT scan of the chest, last 12/15/16, with mediastinal and bilateral hilar lymphadenopathy and some punctate calcified bilateral granulomas.  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  She had some subsequent sputum cultures that were AFB negative.  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  Her compliance with diuretics has been marginal as she described for me at our last office visit.  She confirmed this with Dr Erlinda Hong on admission.  She has been experiencing progressive exertional limitation and dyspnea.  She underwent myocardial perfusion scan 05/21/16 that was reassuring.  She is now admitted with acute weight gain, acute on chronic exertional dyspnea, intermittent chest pain that she describes as pleuritic in nature.   SUBJECTIVE: Pt reports she was unable to tolerate the nasal mask for CPAP last night.  States she thinks she might be able to tolerate nasal prongs but not sure.  She wore the mask for <1 hour.    VITAL SIGNS: Temp:  [98 F (36.7 C)-99.2 F (37.3 C)] 98.1 F (36.7 C) (11/09 0527) Pulse Rate:  [66-71] 71 (11/09 0527) Resp:  [18] 18 (11/09 0527) BP: (128-153)/(72-88) 128/79 (11/09 0527) SpO2:  [95 %-100 %] 95 % (11/09 0527) Weight:  [348 lb 12.3 oz (158.2 kg)] 348 lb 12.3 oz (158.2 kg) (11/09 0527)  PHYSICAL EXAMINATION: General: obese female lying in bed in NAD HEENT: MM pink/moist Neuro: Awakens to voice, speech clear, MAE  CV: s1s2 rrr, no  m/r/g PULM: even/non-labored, lungs bilaterally distant but clear  KK:XFGH, non-tender, bsx4 active  Extremities: warm/dry, trace BLE edema  Skin: no rashes or lesions   Recent Labs  Lab 08/04/17 0450 08/05/17 0418 08/06/17 0451  NA 139 137 137  K 3.9 3.8 3.7  CL 103 101 101  CO2 26 26 27   BUN 23* 25* 27*  CREATININE 0.80 0.77 0.83  GLUCOSE 212* 221* 274*   Recent Labs  Lab 08/04/17 0450 08/05/17 0418 08/06/17 0451  HGB 8.0* 8.1* 7.7*  HCT 27.5* 28.6* 26.9*  WBC 6.3 6.4 7.2  PLT 257 259 246   Ct Chest High Resolution  Result Date: 08/06/2017 CLINICAL DATA:  Chronic dyspnea.  Mycobacterial infection. EXAM: CT CHEST WITHOUT CONTRAST TECHNIQUE: Multidetector CT imaging of the chest was performed following the standard protocol without intravenous contrast. High resolution imaging of the lungs, as well as inspiratory and expiratory imaging, was performed. COMPARISON:  12/15/2016 chest CT.  08/03/2017 chest radiograph. FINDINGS: Cardiovascular: Stable mild cardiomegaly. Stable trace pericardial effusion/ thickening. Normal course and caliber of the thoracic aorta. Stable dilated main pulmonary artery (3.5 cm diameter). Mediastinum/Nodes: No discrete thyroid nodules. Unremarkable esophagus. No axillary adenopathy. No pathologically enlarged mediastinal lymph nodes. Mild bilateral hilar adenopathy appears stable and is poorly delineated on these noncontrast images. Lungs/Pleura: No pneumothorax. No pleural effusion. No acute consolidative airspace disease, lung masses or significant pulmonary nodules. A few stable punctate calcified granulomas are present in both lungs. No significant air trapping on the expiration sequence. Scattered mild interlobular septal thickening in both lungs. There  is relatively uniform ground-glass attenuation throughout both lungs, unchanged. No significant regions of traction bronchiectasis, architectural distortion or frank honeycombing. Upper abdomen:  Unremarkable. Musculoskeletal: No aggressive appearing focal osseous lesions. Mild thoracic spondylosis. IMPRESSION: 1. Stable relatively uniform ground-glass attenuation throughout both lungs. Given the cardiomegaly and mild interlobular septal thickening, the ground-glass attenuation is favored to represent mild pulmonary edema. 2. No significant traction bronchiectasis, architectural distortion or frank honeycombing to suggest chronic infection or interstitial lung disease. 3. Stable mild bilateral hilar lymphadenopathy, most compatible with benign reactive adenopathy. 4. Stable dilated main pulmonary artery, suggesting chronic pulmonary arterial hypertension. Electronically Signed   By: Ilona Sorrel M.D.   On: 08/06/2017 10:24    ASSESSMENT / PLAN:  Acute on chronic respiratory failure Probable decompensated secondary pulmonary hypertension and cor pulmonale with total body volume overload Hypertension with diastolic dysfunction Obstructive sleep apnea, not on treatment Mycobacterium avium colonization, never treated Mediastinal and hilar lymphadenopathy, suspect related to her mycobacterial disease. Last CT chest 11/2016  RECS: Diuresis as renal function / BP permit  Reviewed compliance with patient for lasix and CPAP use  Ensure good blood pressure control  Pt did not tolerate nasal CPAP > ? If she will be able to use nasal prongs.  Will need to pursue as outpatient.  HRCT reviewed as above, attending MD to follow for review  Intermittent CXR   Noe Gens, NP-C [facility] Pgr: [phone] or if no answer [phone] 08/06/2017, 12:23 PM

## 2017-08-06 NOTE — Progress Notes (Signed)
Patient only wore cpap for <1 hour. Stated she was unable to leave it on.

## 2017-08-06 NOTE — Progress Notes (Signed)
  Echocardiogram 2D Echocardiogram has been performed.  [name] 08/06/2017, 3:03 PM

## 2017-08-06 NOTE — Evaluation (Signed)
Physical Therapy Evaluation Patient Details Name: [name] MRN: [id number] DOB: March 16, 1966 Today's Date: 08/06/2017   History of Present Illness  [name] is a 51 y.o. female With history of obesity, sleep apnea, noncompliance with CPAP, non-insulin-dependent type 2 diabetes, hypertension, Mediastinal adenopathy/Mycobacterium avium complex not on treatment, chronic anemia ,diastolic CHF noncompliance will with Lasix presented to the emergency room complaining of progressive dyspnea on exertion, weight gain.   Clinical Impression  Patient evaluated by Physical Therapy with no further acute PT needs identified. All education has been completed and the patient has no further questions. * See below for any follow-up Physical Therapy or equipment needs. PT is signing off. Thank you for this referral. Pt is amb at grossly her baseline, possibly her DOE is somewhat worse but she does report fatigue/DOE at baseline as well; encouraged pt to continue to move as tolerated/amb with nursing staff; VSS during activity; pt is moving about IND'ly in her room    Follow Up Recommendations No PT follow up    Equipment Recommendations  None recommended by PT    Recommendations for Other Services       Precautions / Restrictions Restrictions Weight Bearing Restrictions: No      Mobility  Bed Mobility Overal bed mobility: Modified Independent                Transfers Overall transfer level: Modified independent                  Ambulation/Gait Ambulation/Gait assistance: Supervision   Assistive device: None Gait Pattern/deviations: Wide base of support     General Gait Details: slight trendelenberg gait, O2 sats 90--95% on RA (pt reports sats dropping to 87% at rest with nursing checks), HR 70s-90s with amb; 3/4 DOE  Financial trader Rankin (Stroke Patients Only)       Balance Overall balance assessment: (denies falls)                                            Pertinent Vitals/Pain Pain Assessment: No/denies pain    Home Living Family/patient expects to be discharged to:: Private residence Living Arrangements: Parent;Non-relatives/Friends(mother and 2 brothers)   Type of Home: (lives in 4th floor apt) Home Access: Stairs to enter   Technical brewer of Steps: multiple flights   Home Equipment: None Additional Comments: pt reports stairs to enter apt were tiring at her baseline, needs rest breaks    Prior Function Level of Independence: Independent               Hand Dominance        Extremity/Trunk Assessment   Upper Extremity Assessment Upper Extremity Assessment: Overall WFL for tasks assessed    Lower Extremity Assessment Lower Extremity Assessment: Overall WFL for tasks assessed       Communication   Communication: No difficulties  Cognition Arousal/Alertness: Awake/alert Behavior During Therapy: WFL for tasks assessed/performed Overall Cognitive Status: Within Functional Limits for tasks assessed                                        General Comments      Exercises     Assessment/Plan    PT  Assessment Patent does not need any further PT services  PT Problem List         PT Treatment Interventions      PT Goals (Current goals can be found in the Care Plan section)  Acute Rehab PT Goals PT Goal Formulation: All assessment and education complete, DC therapy    Frequency     Barriers to discharge        Co-evaluation               AM-PAC PT "6 Clicks" Daily Activity  Outcome Measure Difficulty turning over in bed (including adjusting bedclothes, sheets and blankets)?: None Difficulty moving from lying on back to sitting on the side of the bed? : None Difficulty sitting down on and standing up from a chair with arms (e.g., wheelchair, bedside commode, etc,.)?: None Help needed moving to and from a bed to  chair (including a wheelchair)?: None Help needed walking in hospital room?: A Little Help needed climbing 3-5 steps with a railing? : A Little 6 Click Score: 22    End of Session Equipment Utilized During Treatment: Gait belt Activity Tolerance: Patient tolerated treatment well Patient left: in chair;with call bell/phone within reach   PT Visit Diagnosis: Difficulty in walking, not elsewhere classified (R26.2)    Time: 3545-6256 PT Time Calculation (min) (ACUTE ONLY): 14 min   Charges:   PT Evaluation $PT Eval Low Complexity: 1 Low     PT G CodesKenyon Ana, PT Pager: [phone] 08/06/2017   [facility] 08/06/2017, 11:43 AM

## 2017-08-06 NOTE — Consult Note (Signed)
Cardiology Consultation:   Patient ID: [name]; [id number]; 1966-01-20   Admit date: 08/03/2017 Date of Consult: 08/06/2017  Primary Care Provider: Tresa Garter, MD Primary Cardiologist: Dr. [name]   Patient Profile:   [name] is a 51 y.o. female with a hx of hypertensive heart disease, chronic diastolic HF, HTN, DM, obesity and OSA, who is being seen today for the evaluation of acute on chronic CHF at the request of Dr. Doyle Askew, Internal Medicine.   History of Present Illness:   [name] is a 51 y.o. female with a hx of hypertensive heart disease, chronic diastolic HF, HTN, DM, obesity and OSA, who is being seen today for the evaluation of acute on chronic CHF at the request of Dr. Doyle Askew, Internal Medicine.   She is followed by Dr. [name]. She also has a h/o chronic chest pain. Last nuclear study August 2017 showed ejection fraction 73% and normal perfusion. Pt was offered LHC in the past but declined. Also with a h/o SVT. Echo in 2017 showed EF of 60-65%.  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   She presented to the [facility] ED with complaint of progressive exertional dyspnea and weight gain. She also notes CP that is pleuritic. Chest CT w/o contrast in the ED suggestive of mild pulmonary edema and findings also suggestive of chronic pulmonary artery hypertension. CXR also suggestive of pulmonary vascular congestion and perihilar edema.   Pt has been admitted by IM and started on IV Lasix, 80 mg IV BID. Excellent diurese thus far. I/Os net negative 7L since admit. Weight however is unchanged. Doubt accurate. Renal function and K have been stable w/ diuresis. She has been evaluated by pulmonology. It is felt that her dyspnea is mainly cardiac 2/2 acute CHF. Pulmonology has signed off.   Pt admits that she was noncompliant with Lasix at home, primarily due to inconvenience of frequent  urination at work. She notes that she is feeling better now. She states her normal dry weight is 320 lb. Recorded weight today is 348 lb. No further chest pain.   Past Medical History:  Diagnosis Date  . Bipolar disease, chronic ([place])   . Chest pain    a. 2012 Myoview: EF 63%, no isch/infarct;  b. 04/2016 Lexiscan MV: EF 73%, no ischemia/infarct-->Low risk.  . CHF (congestive heart failure) ([place])   . Chronic diastolic CHF (congestive heart failure) ([place])    a. 2015 Echo: EF 55-60%, Gr2 DD;  b. 09/2015 Echo: EF 60-65%, no rwma, mod dil LA, PASP 34mmHg.  Marland Kitchen History of thyrotoxicosis   . Hypertensive heart disease 10/18/2013  . Insulin dependent type 2 diabetes mellitus, uncontrolled ([place])   . Mediastinal adenopathy   . MI (myocardial infarction) ([place])   . Morbid obesity with BMI of 50.0-59.9, adult ([place])   . OSA (obstructive sleep apnea) 03/06/2011    History reviewed. No pertinent surgical history.   Home Medications:  Prior to Admission medications   Medication Sig Start Date End Date Taking? Authorizing Provider  amLODipine (NORVASC) 10 MG tablet Take 1 tablet (10 mg total) by mouth daily. 03/24/17  Yes Tresa Garter, MD  carvedilol (COREG) 25 MG tablet Take 1 tablet (25 mg total) by mouth 2 (two) times daily. 04/12/17  Yes Lelon Perla, MD  furosemide (LASIX) 80 MG tablet Take 1 tablet (80 mg total) by mouth daily. Pt takes it sometimes 03/24/17  Yes Jegede, Olugbemiga E,  MD  glipiZIDE (GLUCOTROL XL) 10 MG 24 hr tablet Take 1 tablet (10 mg total) by mouth daily with breakfast. 03/24/17  Yes Jegede, Olugbemiga E, MD  losartan (COZAAR) 100 MG tablet Take 1 tablet (100 mg total) by mouth daily. 03/24/17  Yes Tresa Garter, MD  metFORMIN (GLUCOPHAGE) 1000 MG tablet Take 1 tablet (1,000 mg total) by mouth 2 (two) times daily with a meal. 03/24/17  Yes Jegede, Olugbemiga E, MD  nitroGLYCERIN (NITROSTAT) 0.4 MG SL tablet Place 0.4 mg under the tongue every 5 (five) minutes as needed  for chest pain.   Yes [provider]  oxybutynin (DITROPAN XL) 15 MG 24 hr tablet TAKE 1 TABLET BY MOUTH AT BEDTIME. 06/03/17  Yes Jegede, Olugbemiga E, MD  potassium chloride (K-DUR) 10 MEQ tablet Take 1 tablet (10 mEq total) by mouth 2 (two) times daily. Patient taking differently: Take 10 mEq by mouth daily.  08/06/16  Yes Tresa Garter, MD  risperiDONE (RISPERDAL) 2 MG tablet Take 2 tablets (4 mg total) by mouth daily. 08/06/16  Yes [name] E, MD  glucose blood test strip Use as instructed 01/06/17   Tresa Garter, MD    Inpatient Medications: Scheduled Meds: . aspirin EC  81 mg Oral Daily  . carvedilol  25 mg Oral BID  . enoxaparin (LOVENOX) injection  70 mg Subcutaneous Q24H  . ferrous sulfate  325 mg Oral TID WC  . furosemide  80 mg Intravenous Q12H  . insulin aspart  0-20 Units Subcutaneous TID WC  . insulin glargine  10 Units Subcutaneous Daily  . losartan  100 mg Oral Daily  . oxybutynin  15 mg Oral QHS  . risperiDONE  4 mg Oral Daily  . senna-docusate  1 tablet Oral BID   Continuous Infusions:  PRN Meds: nitroGLYCERIN, oxyCODONE  Allergies:    Allergies  Allergen Reactions  . Acetaminophen     Fits as a child "seizures-like"  . Caffeine     Tense, anxiety, increased urination  . [place] [Dapagliflozin] Other (See Comments)    Hallucinations, drop in blood sugar.   . Lisinopril Rash    Rash with lisinopril; but fosinopril is ok per patient    Social History:   Social History   Socioeconomic History  . Marital status: Single    Spouse name: Not on file  . Number of children: 0  . Years of education: 86  . Highest education level: Not on file  Social Needs  . Financial resource strain: Not on file  . Food insecurity - worry: Not on file  . Food insecurity - inability: Not on file  . Transportation needs - medical: Not on file  . Transportation needs - non-medical: Not on file  Occupational History  . Occupation: unemployed    Tobacco Use  . Smoking status: Never Smoker  . Smokeless tobacco: Never Used  Substance and Sexual Activity  . Alcohol use: No  . Drug use: No  . Sexual activity: Not Currently    Birth control/protection: None  Other Topics Concern  . 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 But ran out of money and is no longer attending school. (Note patient has bipolar disorder).      Born in [place] but lived in [place] before coming back to [place].       Primary  language is Arabic. Lives with mother and brother.             Family History:    Family History  Problem Relation Age of Onset  . Heart failure Father   . Stroke Father   . Hypertension Mother   . Heart disease Maternal Grandfather   . Heart disease Paternal Grandfather      ROS:  Please see the history of present illness.  ROS  All other ROS reviewed and negative.     Physical Exam/Data:   Vitals:   08/05/17 1436 08/05/17 2116 08/06/17 0527 08/06/17 1340  BP: (!) 153/88 (!) 149/72 128/79 (!) 116/58  Pulse: 67 66 71 63  Resp: 18 18 18 14   Temp: 98 F (36.7 C) 99.2 F (37.3 C) 98.1 F (36.7 C) 98.4 F (36.9 C)  TempSrc: Oral Oral Oral Oral  SpO2: 100% 98% 95% 95%  Weight:   (!) 348 lb 12.3 oz (158.2 kg)   Height:        Intake/Output Summary (Last 24 hours) at 08/06/2017 1516 Last data filed at 08/06/2017 1200 Gross per 24 hour  Intake 930 ml  Output 3100 ml  Net -2170 ml   Filed Weights   08/04/17 0459 08/05/17 0529 08/06/17 0527  Weight: (!) 347 lb 10.7 oz (157.7 kg) (!) 348 lb 8.8 oz (158.1 kg) (!) 348 lb 12.3 oz (158.2 kg)   Body mass index is 54.62 kg/m.  General:  Well nourished, well developed, in no acute distress, morbidly obese  HEENT: normal Lymph: no adenopathy Neck: no JVD Endocrine:  No thryomegaly Vascular: No carotid bruits; FA pulses 2+ bilaterally without bruits  Cardiac:   normal S1, S2; RRR; no murmur  Lungs:  clear to auscultation bilaterally, no wheezing, rhonchi or rales  Abd: soft, nontender, no hepatomegaly  Ext: no edema Musculoskeletal:  No deformities, BUE and BLE strength normal and equal Skin: warm and dry  Neuro:  CNs 2-12 intact, no focal abnormalities noted Psych:  Normal affect   EKG:  The EKG was personally reviewed and demonstrates:  NSR Telemetry:  Telemetry was personally reviewed and demonstrates:  NSR   Relevant CV Studies: 2D Echo pending   Laboratory Data:  Chemistry Recent Labs  Lab 08/04/17 0450 08/05/17 0418 08/06/17 0451  NA 139 137 137  K 3.9 3.8 3.7  CL 103 101 101  CO2 26 26 27   GLUCOSE 212* 221* 274*  BUN 23* 25* 27*  CREATININE 0.80 0.77 0.83  CALCIUM 9.2 8.9 8.9  GFRNONAA >60 >60 >60  GFRAA >60 >60 >60  ANIONGAP 10 10 9     Recent Labs  Lab 08/03/17 0553  PROT 7.3  ALBUMIN 3.5  AST 17  ALT 11*  ALKPHOS 103  BILITOT 0.5   Hematology Recent Labs  Lab 08/04/17 0450 08/05/17 0418 08/06/17 0451  WBC 6.3 6.4 7.2  RBC 3.84*  3.84* 3.97 3.74*  HGB 8.0* 8.1* 7.7*  HCT 27.5* 28.6* 26.9*  MCV 71.6* 72.0* 71.9*  MCH 20.8* 20.4* 20.6*  MCHC 29.1* 28.3* 28.6*  RDW 18.4* 18.3* 18.6*  PLT 257 259 246   Cardiac EnzymesNo results for input(s): TROPONINI in the last 168 hours.  Recent Labs  Lab 08/02/17 1928 08/03/17 0607  TROPIPOC 0.01 0.00    BNP Recent Labs  Lab 08/03/17 0553  BNP 250.5*    DDimer  Recent Labs  Lab 08/03/17 0553  DDIMER 0.48    Radiology/Studies:  Dg Chest 2 View  Result Date:  08/03/2017 CLINICAL DATA:  Left-sided chest pain and shortness of breath increasing over the past 9 days. EXAM: CHEST  2 VIEW COMPARISON:  08/02/2017 FINDINGS: Cardiac enlargement with pulmonary vascular congestion. Hazy perihilar infiltrates consistent with edema. Mild progression since previous study. No focal consolidation. No blunting of costophrenic angles. No pneumothorax. Mediastinal  contours appear intact. Small amount of fluid in the fissures. Old deformity of the distal right clavicle. IMPRESSION: Cardiac enlargement with pulmonary vascular congestion and perihilar edema. Mild progression since previous study. Electronically Signed   By: Lucienne Capers M.D.   On: 08/03/2017 06:55   Dg Chest 2 View  Result Date: 08/02/2017 CLINICAL DATA:  Chest pain 1 week. EXAM: CHEST  2 VIEW COMPARISON:  07/20/2017 FINDINGS: Lungs are adequately inflated with persistent bilateral perihilar and bibasilar opacification likely interstitial edema which has slightly worse. Cannot exclude infection in the lung bases. No evidence of effusion. Mild stable cardiomegaly. Remainder of the exam is unchanged. IMPRESSION: Mild stable cardiomegaly with findings suggesting interstitial edema slightly worse. Could not exclude infection in the lung bases. Electronically Signed   By: Marin Olp M.D.   On: 08/02/2017 19:56   Ct Chest High Resolution  Result Date: 08/06/2017 CLINICAL DATA:  Chronic dyspnea.  Mycobacterial infection. EXAM: CT CHEST WITHOUT CONTRAST TECHNIQUE: Multidetector CT imaging of the chest was performed following the standard protocol without intravenous contrast. High resolution imaging of the lungs, as well as inspiratory and expiratory imaging, was performed. COMPARISON:  12/15/2016 chest CT.  08/03/2017 chest radiograph. FINDINGS: Cardiovascular: Stable mild cardiomegaly. Stable trace pericardial effusion/ thickening. Normal course and caliber of the thoracic aorta. Stable dilated main pulmonary artery (3.5 cm diameter). Mediastinum/Nodes: No discrete thyroid nodules. Unremarkable esophagus. No axillary adenopathy. No pathologically enlarged mediastinal lymph nodes. Mild bilateral hilar adenopathy appears stable and is poorly delineated on these noncontrast images. Lungs/Pleura: No pneumothorax. No pleural effusion. No acute consolidative airspace disease, lung masses or significant  pulmonary nodules. A few stable punctate calcified granulomas are present in both lungs. No significant air trapping on the expiration sequence. Scattered mild interlobular septal thickening in both lungs. There is relatively uniform ground-glass attenuation throughout both lungs, unchanged. No significant regions of traction bronchiectasis, architectural distortion or frank honeycombing. Upper abdomen: Unremarkable. Musculoskeletal: No aggressive appearing focal osseous lesions. Mild thoracic spondylosis. IMPRESSION: 1. Stable relatively uniform ground-glass attenuation throughout both lungs. Given the cardiomegaly and mild interlobular septal thickening, the ground-glass attenuation is favored to represent mild pulmonary edema. 2. No significant traction bronchiectasis, architectural distortion or frank honeycombing to suggest chronic infection or interstitial lung disease. 3. Stable mild bilateral hilar lymphadenopathy, most compatible with benign reactive adenopathy. 4. Stable dilated main pulmonary artery, suggesting chronic pulmonary arterial hypertension. Electronically Signed   By: Ilona Sorrel M.D.   On: 08/06/2017 10:24    Assessment and Plan:   1. Acute on chronic Diastolic CHF: in the setting of medication non compliance with home diuretics. Pt notes she avoids lasix due to the inconvenience of frequent urination during work hours. I explained to patient importance of regular daily compliance with diuretics to prevent acute CHF exacerbation and need for hospital admissions, as well as adherence to low sodium diet and daily weights. Pt appears to be responding well to IV Lasix. I/Os net negative 7L since admit. Renal function and K both stable. Difficult to assess volume given morbid obesity, however pt notes her normal dry weight is ~ 320 lb. Documented weight today is 348 lb. Recommend additional diuresis to try  to get closer to dry weight. Monitor renal function and K. I also explained to patient  that usual duration of Lasix is 6 hrs. She can try taking dose later in the afternoon to limit inconvenience at work, however she should expect more urination at night. She needs to find a time of the day that works best for her to improve compliance.   MD to follow with further recs.    For questions or updates, please contact [place] Please consult [email or web address] for contact info under Cardiology/STEMI.   Signed, Lyda Jester, PA-C  08/06/2017 3:16 PM   As above; Patient seen and examined. Briefly she is a 51 year old female with past medical history of hypertension, chronic diastolic congestive heart failure, morbid obesity, diabetes mellitus, hypertension, obstructive sleep apnea for evaluation of acute on chronic diastolic congestive heart failure. Patient also with history of chronic chest pain. Nuclear study August 2017 showed ejection fraction 73% and normal perfusion. Last echocardiogram in 2017 showed ejection fraction 60-65%. She presented with complaints of increasing dyspnea on exertion, orthopnea, pedal edema and weight gain over the preceding 10 days. She also had chest pain that was sharp and increased with inspiration. She was admitted and she has been diuresed. Cardiology now asked to evaluate. Note patient has not been compliant with her Lasix or fluid restriction. She does not weigh daily. Electrocardiogram shows sinus rhythm and cannot exclude prior lateral infarct.  1 acute on chronic diastolic congestive heart failure-patient's symptoms are improving. Would continue Lasix 80 mg IV twice a day. Follow renal function. Follow up echocardiogram results pending. I discussed the importance of compliance with her diuretic. I also discussed the importance of fluid restriction, daily weights with additional Lasix for weight gain of 2-3 pounds and low sodium diet.  2 morbid obesity-patient needs weight loss.  3 history of chronic chest pain-patient's enzymes are negative.  Previous nuclear study negative. Would not pursue further ischemia evaluation.  4 Microcytic anemia-further evaluation per primary care.   5 hypertension -blood pressure is controlled this afternoon. Continue present medications and follow.   [name], MD

## 2017-08-06 NOTE — Progress Notes (Signed)
Pt refused to wear CPAP.  RT will continue to monitor.  

## 2017-08-06 NOTE — Progress Notes (Signed)
Patient ID: [name], female   DOB: Dec 24, 1965, 51 y.o.   MRN: [id number]    PROGRESS NOTE   [name]  EPP:[id number] DOB: June 20, 1966 DOA: 08/03/2017  PCP: Tresa Garter, MD   Brief Narrative:  Patient is 51 year old female with known sleep apnea, noncompliance with CPAP, non-insulin-dependent type 2 diabetes, hypertension, mediastinal adenopathy, SMAC, not on treatment, diastolic CHF with noncompliance with Lasix, presented to emergency department with worsening dyspnea, 3-4 pillow orthopnea and weight gain.  Assessment & Plan:  Acute on chronic dchf exacerbation: - Echocardiogram in 2017 with preserved left ventricular EF - Stress test in August 2017 unremarkable - still on lasix 80 mg IV BID but says she does not feel any different, still with dyspnea  - Weight trend since admission Filed Weights   08/04/17 0459 08/05/17 0529 08/06/17 0527  Weight: (!) 157.7 kg (347 lb 10.7 oz) (!) 158.1 kg (348 lb 8.8 oz) (!) 158.2 kg (348 lb 12.3 oz)  - consulted with cardiology - monitor daily weights, I/O  Anemia of chronic disease - No evidence of active bleeding - Iron levels low, oral supplementation already started - Hg slightly down overnight  - CBC in AM  Non-insulin-dependent DM type II - Last A1c in June 10.2 - Repeat A1c pending - Continue sliding scale insulin - added lantus as CBG's in 200  Hypomagnesemia - Has been supplemented, repeat Mg this AM - supplement if still needed   HTN - Reasonable inpatient control - Continue Cozaar, Coreg, Lasix  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   Objective: Vitals:   08/05/17 0529 08/05/17 1436 08/05/17 2116 08/06/17 0527  BP: 132/67 (!) 153/88 (!) 149/72 128/79  Pulse: 66 67 66 71  Resp: _0 Temp: 98 F (36.7 C) 98 F (36.7 C) 99.2 F (37.3 C) 98.1 F (36.7 C)  TempSrc: Oral Oral Oral Oral  SpO2: 100% 100% 98% 95%  Weight: (!) 158.1 kg (348 lb 8.8 oz)   (!) 158.2 kg (348 lb 12.3 oz)  Height:        Intake/Output Summary (Last 24 hours) at 08/06/2017 0826 Last data filed at 08/06/2017 0528 Gross per 24 hour  Intake 450 ml  Output 3300 ml  Net -2850 ml   Filed Weights   08/04/17 0459 08/05/17 0529 08/06/17 0527  Weight: (!) 157.7 kg (347 lb 10.7 oz) (!) 158.1 kg (348 lb 8.8 oz) (!) 158.2 kg (348 lb 12.3 oz)    Physical Exam  Constitutional: Appears calm, resting, NAD CVS: RRR, S1/S2 +, no murmurs, no gallops, no carotid bruit.  Pulmonary: Effort and breath sounds normal, no stridor Abdominal: Soft. BS +,  no distension, tenderness, rebound or guarding.  Musculoskeletal: Normal range of motion. Trace bilateral LE edema  Lymphadenopathy: No lymphadenopathy noted, cervical, inguinal. Neuro: Alert. Normal reflexes, muscle tone coordination. No cranial nerve deficit. Skin: Skin is warm and dry. No rash noted. Not diaphoretic. No erythema. No pallor.  Psychiatric: Normal mood and affect. Behavior, judgment, thought content normal.   Data Reviewed: I have personally reviewed following  labs and imaging studies  CBC: Recent Labs  Lab 08/02/17 1915 08/03/17 0920 08/04/17 0450 08/05/17 0418 08/06/17 0451  WBC 7.2 6.2 6.3 6.4 7.2  HGB 8.1* 7.9* 8.0* 8.1* 7.7*  HCT 27.7* 27.2* 27.5* 28.6* 26.9*  MCV 72.3* 71.8* 71.6* 72.0* 71.9*  PLT 269 274 257 259 078   Basic Metabolic Panel: Recent Labs  Lab 08/02/17 1915 08/03/17 0553 08/03/17 0921 08/04/17 0450 08/05/17 0418 08/06/17 0451  NA 139 138  --  139 137  137  K 3.7 3.9  --  3.9 3.8 3.7  CL 105 103  --  103 101 101  CO2 26 25  --  _0 GLUCOSE 184* 128*  --  212* 221* 274*  BUN 18 16  --  23* 25* 27*  CREATININE 0.67 0.60 0.61 0.80 0.77 0.83  CALCIUM 9.2 9.0  --  9.2 8.9 8.9  MG  --   --   --  1.5* 1.6*  --    Liver Function Tests: Recent Labs  Lab 08/03/17 0553  AST 17  ALT 11*  ALKPHOS 103  BILITOT 0.5  PROT 7.3  ALBUMIN 3.5   CBG: Recent Labs  Lab 08/04/17 2120 08/05/17 0736 08/05/17 1217 08/05/17 1703 08/06/17 0750  GLUCAP 194* 195* 232* 300* 220*   Thyroid Function Tests: Recent Labs    08/04/17 0450  TSH 4.181  FREET4 1.03   Anemia Panel: Recent Labs    08/04/17 0450  VITAMINB12 160*  FOLATE 9.5  TIBC 491*  IRON 22*  RETICCTPCT 2.5   Urine analysis:    Component Value Date/Time   COLORURINE YELLOW 08/06/2016 1245   APPEARANCEUR TURBID (A) 08/06/2016 1245   LABSPEC 1.028 08/06/2016 1245   PHURINE 5.5 08/06/2016 1245   GLUCOSEU 2+ (A) 08/06/2016 1245   HGBUR NEGATIVE 08/06/2016 1245   BILIRUBINUR NEGATIVE 08/06/2016 1245   BILIRUBINUR neg 09/10/2014 [place] 08/06/2016 1245   PROTEINUR 2+ (A) 08/06/2016 1245   UROBILINOGEN 1.0 09/10/2014 1219   UROBILINOGEN 0.2 12/07/2013 0041   NITRITE NEGATIVE 08/06/2016 1245   LEUKOCYTESUR NEGATIVE 08/06/2016 1245    Radiology Studies: No results found.  Scheduled Meds: . aspirin EC  81 mg Oral Daily  . carvedilol  25 mg Oral BID  . enoxaparin (LOVENOX) injection  70 mg Subcutaneous Q24H  . ferrous sulfate  325 mg Oral TID WC  . furosemide  80 mg Intravenous Q12H  . insulin aspart  0-20 Units Subcutaneous TID WC  . losartan  100 mg Oral Daily  . oxybutynin  15 mg Oral QHS  . risperiDONE  4 mg Oral Daily  . senna-docusate  1 tablet Oral BID   Continuous Infusions:   LOS: 2 days   Time spent: 25 minutes   Faye Ramsay, MD [facility] Pager [phone]  If 7PM-7AM, please contact  night-coverage [email or web address] Password [email or web address] 08/06/2017, 8:26 AM

## 2017-08-07 DIAGNOSIS — I059 Rheumatic mitral valve disease, unspecified: Secondary | ICD-10-CM

## 2017-08-07 LAB — BASIC METABOLIC PANEL
Anion gap: 11 (ref 5–15)
BUN: 27 mg/dL — ABNORMAL HIGH (ref 6–20)
CALCIUM: 8.7 mg/dL — AB (ref 8.9–10.3)
CO2: 27 mmol/L (ref 22–32)
CREATININE: 0.76 mg/dL (ref 0.44–1.00)
Chloride: 99 mmol/L — ABNORMAL LOW (ref 101–111)
GFR calc non Af Amer: >60 mL/min (ref >60)
GLUCOSE: 204 mg/dL — AB (ref 65–99)
Potassium: 3.8 mmol/L (ref 3.5–5.1)
Sodium: 137 mmol/L (ref 135–145)

## 2017-08-07 LAB — CBC
HEMATOCRIT: 27.9 % — AB (ref 36.0–46.0)
Hemoglobin: 7.9 g/dL — ABNORMAL LOW (ref 12.0–15.0)
MCH: 20.6 pg — ABNORMAL LOW (ref 26.0–34.0)
MCHC: 28.3 g/dL — AB (ref 30.0–36.0)
MCV: 72.8 fL — ABNORMAL LOW (ref 78.0–100.0)
PLATELETS: 249 10*3/uL (ref 150–400)
RBC: 3.83 MIL/uL — ABNORMAL LOW (ref 3.87–5.11)
RDW: 18.9 % — AB (ref 11.5–15.5)
WBC: 7.4 10*3/uL (ref 4.0–10.5)

## 2017-08-07 LAB — MAGNESIUM: Magnesium: 1.7 mg/dL (ref 1.7–2.4)

## 2017-08-07 LAB — GLUCOSE, CAPILLARY
GLUCOSE-CAPILLARY: 184 mg/dL — AB (ref 65–99)
GLUCOSE-CAPILLARY: 273 mg/dL — AB (ref 65–99)
Glucose-Capillary: 225 mg/dL — ABNORMAL HIGH (ref 65–99)
Glucose-Capillary: 230 mg/dL — ABNORMAL HIGH (ref 65–99)

## 2017-08-07 MED ORDER — INSULIN GLARGINE 100 UNIT/ML ~~LOC~~ SOLN
15.0000 [IU] | Freq: Every day | SUBCUTANEOUS | Status: DC
  Administered 2017-08-08: 15 [IU] via SUBCUTANEOUS
  Filled 2017-08-07: qty 0.15

## 2017-08-07 MED ORDER — ENOXAPARIN SODIUM 80 MG/0.8ML ~~LOC~~ SOLN
80.0000 mg | SUBCUTANEOUS | Status: DC
  Administered 2017-08-08 – 2017-08-13 (×6): 80 mg via SUBCUTANEOUS
  Filled 2017-08-07 (×6): qty 0.8

## 2017-08-07 NOTE — Progress Notes (Addendum)
Progress Note  Patient Name: [name] Date of Encounter: 08/07/2017  Primary Cardiologist: [facility]     Patient Profile    [name] is a 51 y.o. female with a hx of hypertensive heart disease, chronic diastolic HF, HTN, DM, obesity and OSA, who is being seen today for the evaluation of acute on chronic CHF at the request of Dr. Doyle Askew, Internal Medicine.     Subjective  A little better but still sob at <40 ft  Inpatient Medications    Scheduled Meds: . aspirin EC  81 mg Oral Daily  . carvedilol  25 mg Oral BID  . enoxaparin (LOVENOX) injection  70 mg Subcutaneous Q24H  . ferrous sulfate  325 mg Oral TID WC  . furosemide  80 mg Intravenous Q12H  . insulin aspart  0-20 Units Subcutaneous TID WC  . insulin glargine  10 Units Subcutaneous Daily  . losartan  100 mg Oral Daily  . oxybutynin  15 mg Oral QHS  . risperiDONE  4 mg Oral Daily  . senna-docusate  1 tablet Oral BID   Continuous Infusions:  PRN Meds: nitroGLYCERIN, oxyCODONE   Vital Signs    Vitals:   08/06/17 1340 08/06/17 2045 08/07/17 0632 08/07/17 0638  BP: (!) 116/58 129/69 118/64   Pulse: 63 76 (!) 59   Resp: 14 20 20    Temp: 98.4 F (36.9 C) 98.6 F (37 C) 98.3 F (36.8 C)   TempSrc: Oral Axillary Oral   SpO2: 95% 95% 95%   Weight:    (!) 351 lb 9.6 oz (159.5 kg)  Height:        Intake/Output Summary (Last 24 hours) at 08/07/2017 1002 Last data filed at 08/06/2017 1804 Gross per 24 hour  Intake 600 ml  Output -  Net 600 ml   Filed Weights   08/05/17 0529 08/06/17 0527 08/07/17 0638  Weight: (!) 348 lb 8.8 oz (158.1 kg) (!) 348 lb 12.3 oz (158.2 kg) (!) 351 lb 9.6 oz (159.5 kg)    Telemetry    Sinus with rare PVC and single short run of atrial tach  - Personally Reviewed  ECG    Sinus with RAD  Dating back to 3/18  NO LVH ??   Physical Exam    GEN: No acute distress.   Neck: JVD unable to discern Cardiac: RRR, no  murmurs, rubs, or gallops.  Respiratory: Clear to  auscultation bilaterally. GI: Soft, nontender, non-distended  MS: 1-2+ edema; No deformity. Neuro:  Nonfocal  Psych: Normal affect  Skin Warm and dry   Labs    Chemistry Recent Labs  Lab 08/03/17 0553  08/05/17 0418 08/06/17 0451 08/07/17 0459  NA 138   < > 137 137 137  K 3.9   < > 3.8 3.7 3.8  CL 103   < > 101 101 99*  CO2 25   < > 26 27 27   GLUCOSE 128*   < > 221* 274* 204*  BUN 16   < > 25* 27* 27*  CREATININE 0.60   < > 0.77 0.83 0.76  CALCIUM 9.0   < > 8.9 8.9 8.7*  PROT 7.3  --   --   --   --   ALBUMIN 3.5  --   --   --   --   AST 17  --   --   --   --   ALT 11*  --   --   --   --  ALKPHOS 103  --   --   --   --   BILITOT 0.5  --   --   --   --   GFRNONAA >60   < > >60 >60 >60  GFRAA >60   < > >60 >60 >60  ANIONGAP 10   < > 10 9 11    < > = values in this interval not displayed.     Hematology Recent Labs  Lab 08/05/17 0418 08/06/17 0451 08/07/17 0459  WBC 6.4 7.2 7.4  RBC 3.97 3.74* 3.83*  HGB 8.1* 7.7* 7.9*  HCT 28.6* 26.9* 27.9*  MCV 72.0* 71.9* 72.8*  MCH 20.4* 20.6* 20.6*  MCHC 28.3* 28.6* 28.3*  RDW 18.3* 18.6* 18.9*  PLT 259 246 249    Cardiac EnzymesNo results for input(s): TROPONINI in the last 168 hours.  Recent Labs  Lab 08/02/17 1928 08/03/17 0607  TROPIPOC 0.01 0.00     BNP Recent Labs  Lab 08/03/17 0553  BNP 250.5*     DDimer  Recent Labs  Lab 08/03/17 0553  DDIMER 0.48     Radiology    Ct Chest High Resolution  Result Date: 08/06/2017 CLINICAL DATA:  Chronic dyspnea.  Mycobacterial infection. EXAM: CT CHEST WITHOUT CONTRAST TECHNIQUE: Multidetector CT imaging of the chest was performed following the standard protocol without intravenous contrast. High resolution imaging of the lungs, as well as inspiratory and expiratory imaging, was performed. COMPARISON:  12/15/2016 chest CT.  08/03/2017 chest radiograph. FINDINGS: Cardiovascular: Stable mild cardiomegaly. Stable trace pericardial effusion/ thickening. Normal course  and caliber of the thoracic aorta. Stable dilated main pulmonary artery (3.5 cm diameter). Mediastinum/Nodes: No discrete thyroid nodules. Unremarkable esophagus. No axillary adenopathy. No pathologically enlarged mediastinal lymph nodes. Mild bilateral hilar adenopathy appears stable and is poorly delineated on these noncontrast images. Lungs/Pleura: No pneumothorax. No pleural effusion. No acute consolidative airspace disease, lung masses or significant pulmonary nodules. A few stable punctate calcified granulomas are present in both lungs. No significant air trapping on the expiration sequence. Scattered mild interlobular septal thickening in both lungs. There is relatively uniform ground-glass attenuation throughout both lungs, unchanged. No significant regions of traction bronchiectasis, architectural distortion or frank honeycombing. Upper abdomen: Unremarkable. Musculoskeletal: No aggressive appearing focal osseous lesions. Mild thoracic spondylosis. IMPRESSION: 1. Stable relatively uniform ground-glass attenuation throughout both lungs. Given the cardiomegaly and mild interlobular septal thickening, the ground-glass attenuation is favored to represent mild pulmonary edema. 2. No significant traction bronchiectasis, architectural distortion or frank honeycombing to suggest chronic infection or interstitial lung disease. 3. Stable mild bilateral hilar lymphadenopathy, most compatible with benign reactive adenopathy. 4. Stable dilated main pulmonary artery, suggesting chronic pulmonary arterial hypertension. Electronically Signed   By: Ilona Sorrel M.D.   On: 08/06/2017 10:24    Cardiac Studies  Echo  LVEF  Normal LVH (15 mm)  L & RAE (56.2.0/42) 1.3 cm mass on Mitral annular calcificied lesion     Assessment & Plan    Diastolic heart failure acute on chronic  Hypertension with hypertensive heart disease  Morbid obesity super   Chest pain-atypical  Hypertension  Microcytic anemia  Mitral  valve calcified mass  R sided lower extremity numbness   Will increase diuretics as still with edema Bun increassing but not yet Cr  Will have the team review the mitral valve lesion>>? TEE  I dont know whether it is related to the R Leg numbness, ie embolic event. I know diabetic can have problem with lateral cutaneous femoral nerve  Have suggested she consider obesity reduction surgery  BP well controlled at present   LVH  There is some discrepancy between the Echo and ECG, but her hx of long standing hypertension likely explain LVH  In this regard, that African american LVH w/ which we are so familiar is limited to [place] african descent, and this lady is from [place]  We have to keep HCM or phenocopies in mind     Signed, Virl Axe, MD  08/07/2017, 10:02 AM

## 2017-08-07 NOTE — Progress Notes (Signed)
Pt attempted CPAP QHS but stated that she wasn't comfortable and that she didn't like it.  RT to monitor and assess as needed.

## 2017-08-07 NOTE — Progress Notes (Signed)
Patient ID: [name], female   DOB: [birth year], 51 y.o.   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  Assessment & Plan:  Acute on chronic dchf exacerbation: - Echocardiogram in 2017 with preserved left ventricular EF - Stress test in August 2017 unremarkable - continue Lasix 80 mg IV BID - Weight trend since admission Filed Weights   08/05/17 0529 08/06/17 0527 08/07/17 9562  Weight: (!) 158.1 kg (348 lb 8.8 oz) (!) 158.2 kg (348 lb 12.3 oz) (!) 159.5 kg (351 lb 9.6 oz)  - follow up on cardiology team recommendations  - daily weights and strict I/O  Anemia of chronic disease - No evidence of active bleeding - Iron levels low, oral supplementation already started - Hg overall stable - CBC in AM  Non-insulin-dependent DM type II - Last A1c in June 10.2 - Repeat A1c 9.7 - Continue sliding scale insulin - added lantus 10 U, will increase to 15 U until better CBG control   Hypomagnesemia - has been supplemented and 1.7 this AM - will repeat Mg in AM  HTN - Reasonable inpatient control - continue Cozaar, Coreg, Lasix   Mediastinal adenopathy/Mycobacterium avium complex (HCC) - Not currently treated, ace wnl, negative quantiferon test, negative HIV, followed by Dr Lamonte Sakai,   OSA - CPAP QHS  Depression/anxiety: - stable   DVT prophylaxis: Lovenox SQ Code Status: Full  Family Communication: Patient at bedside Disposition Plan: to be determined   Consultants:   PCCM   Cardiology   Procedures:   None  Antimicrobials:    None  Subjective: Pt reports feeling little bit bette this AM, less dyspnea.  Objective: Vitals:   08/06/17 1340 08/06/17 2045 08/07/17 0632 08/07/17 0638  BP: (!) 116/58 129/69 118/64   Pulse: 63 76 (!) 59   Resp: 14 20 20    Temp: 98.4 F (36.9 C) 98.6 F (37 C) 98.3 F (36.8 C)   TempSrc: Oral Axillary Oral   SpO2: 95% 95% 95%   Weight:    (!) 159.5 kg (351 lb 9.6 oz)  Height:        Intake/Output Summary (Last 24 hours) at 08/07/2017 1136 Last data filed at 08/06/2017 1804 Gross per 24 hour  Intake 600 ml  Output -  Net 600 ml   Filed Weights   08/05/17 0529 08/06/17 0527 08/07/17 0638  Weight: (!) 158.1 kg (348 lb 8.8 oz) (!) 158.2 kg (348 lb 12.3 oz) (!) 159.5 kg (351 lb 9.6 oz)   Physical Exam  Constitutional: Appears calm, NAD CVS: RRR, S1/S2 +, no murmurs, no gallops, no carotid bruit.  Pulmonary: Effort and breath sounds normal, diminished breath sounds at bases  Abdominal: Soft. BS +,  no distension, tenderness, rebound or guarding.  Musculoskeletal: Normal range of motion. Trace bilateral LE edema, non pitting  Neuro: Alert. Normal reflexes, muscle tone coordination. No cranial nerve deficit.  Data Reviewed: I have personally reviewed following labs and imaging studies  CBC: Recent Labs  Lab 08/03/17 0920 08/04/17 0450 08/05/17 0418 08/06/17 0451 08/07/17 0459  WBC 6.2 6.3 6.4 7.2 7.4  HGB 7.9* 8.0* 8.1* 7.7* 7.9*  HCT 27.2* 27.5*  28.6* 26.9* 27.9*  MCV 71.8* 71.6* 72.0* 71.9* 72.8*  PLT 274 257 259 246 702   Basic Metabolic Panel: Recent Labs  Lab 08/03/17 0553 08/03/17 0921 08/04/17 0450 08/05/17 0418 08/06/17 0451 08/07/17 0459  NA 138  --  139 137 137 137  K 3.9  --  3.9 3.8 3.7 3.8  CL 103  --  103 101 101 99*  CO2 25  --  26 26 27 27   GLUCOSE 128*  --  212* 221* 274* 204*  BUN 16  --  23* 25* 27* 27*  CREATININE 0.60 0.61 0.80 0.77 0.83 0.76  CALCIUM 9.0  --  9.2 8.9 8.9 8.7*  MG  --   --  1.5* 1.6* 1.7 1.7   Liver  Function Tests: Recent Labs  Lab 08/03/17 0553  AST 17  ALT 11*  ALKPHOS 103  BILITOT 0.5  PROT 7.3  ALBUMIN 3.5   CBG: Recent Labs  Lab 08/05/17 1703 08/06/17 0750 08/06/17 1150 08/06/17 1627 08/07/17 0751  GLUCAP 300* 220* 240* 274* 184*   Urine analysis:    Component Value Date/Time   COLORURINE YELLOW 08/06/2016 1245   APPEARANCEUR TURBID (A) 08/06/2016 1245   LABSPEC 1.028 08/06/2016 1245   PHURINE 5.5 08/06/2016 1245   GLUCOSEU 2+ (A) 08/06/2016 1245   HGBUR NEGATIVE 08/06/2016 1245   BILIRUBINUR NEGATIVE 08/06/2016 1245   BILIRUBINUR neg 09/10/2014 1219   KETONESUR NEGATIVE 08/06/2016 1245   PROTEINUR 2+ (A) 08/06/2016 1245   UROBILINOGEN 1.0 09/10/2014 1219   UROBILINOGEN 0.2 12/07/2013 0041   NITRITE NEGATIVE 08/06/2016 1245   LEUKOCYTESUR NEGATIVE 08/06/2016 1245    Radiology Studies: Ct Chest High Resolution  Result Date: 08/06/2017 CLINICAL DATA:  Chronic dyspnea.  Mycobacterial infection. EXAM: CT CHEST WITHOUT CONTRAST TECHNIQUE: Multidetector CT imaging of the chest was performed following the standard protocol without intravenous contrast. High resolution imaging of the lungs, as well as inspiratory and expiratory imaging, was performed. COMPARISON:  12/15/2016 chest CT.  08/03/2017 chest radiograph. FINDINGS: Cardiovascular: Stable mild cardiomegaly. Stable trace pericardial effusion/ thickening. Normal course and caliber of the thoracic aorta. Stable dilated main pulmonary artery (3.5 cm diameter). Mediastinum/Nodes: No discrete thyroid nodules. Unremarkable esophagus. No axillary adenopathy. No pathologically enlarged mediastinal lymph nodes. Mild bilateral hilar adenopathy appears stable and is poorly delineated on these noncontrast images. Lungs/Pleura: No pneumothorax. No pleural effusion. No acute consolidative airspace disease, lung masses or significant pulmonary nodules. A few stable punctate calcified granulomas are present in both lungs. No  significant air trapping on the expiration sequence. Scattered mild interlobular septal thickening in both lungs. There is relatively uniform ground-glass attenuation throughout both lungs, unchanged. No significant regions of traction bronchiectasis, architectural distortion or frank honeycombing. Upper abdomen: Unremarkable. Musculoskeletal: No aggressive appearing focal osseous lesions. Mild thoracic spondylosis. IMPRESSION: 1. Stable relatively uniform ground-glass attenuation throughout both lungs. Given the cardiomegaly and mild interlobular septal thickening, the ground-glass attenuation is favored to represent mild pulmonary edema. 2. No significant traction bronchiectasis, architectural distortion or frank honeycombing to suggest chronic infection or interstitial lung disease. 3. Stable mild bilateral hilar lymphadenopathy, most compatible with benign reactive adenopathy. 4. Stable dilated main pulmonary artery, suggesting chronic pulmonary arterial hypertension. Electronically Signed   By: Ilona Sorrel M.D.   On: 08/06/2017 10:24    Scheduled Meds: . aspirin EC  81 mg Oral Daily  . carvedilol  25 mg Oral BID  . [START ON 08/08/2017] enoxaparin (LOVENOX) injection  80 mg Subcutaneous Q24H  . ferrous  sulfate  325 mg Oral TID WC  . furosemide  80 mg Intravenous Q12H  . insulin aspart  0-20 Units Subcutaneous TID WC  . insulin glargine  10 Units Subcutaneous Daily  . losartan  100 mg Oral Daily  . oxybutynin  15 mg Oral QHS  . risperiDONE  4 mg Oral Daily  . senna-docusate  1 tablet Oral BID   Continuous Infusions:   LOS: 3 days   Time spent: 25 minutes   Faye Ramsay, MD [facility] Pager [phone]  If 7PM-7AM, please contact night-coverage [email or web address] Password [email or web address] 08/07/2017, 11:36 AM

## 2017-08-08 LAB — BASIC METABOLIC PANEL
Anion gap: 8 (ref 5–15)
BUN: 23 mg/dL — AB (ref 6–20)
CALCIUM: 8.7 mg/dL — AB (ref 8.9–10.3)
CO2: 29 mmol/L (ref 22–32)
CREATININE: 0.7 mg/dL (ref 0.44–1.00)
Chloride: 100 mmol/L — ABNORMAL LOW (ref 101–111)
GFR calc Af Amer: >60 mL/min (ref >60)
GLUCOSE: 218 mg/dL — AB (ref 65–99)
POTASSIUM: 3.9 mmol/L (ref 3.5–5.1)
SODIUM: 137 mmol/L (ref 135–145)

## 2017-08-08 LAB — CBC
HCT: 27.5 % — ABNORMAL LOW (ref 36.0–46.0)
Hemoglobin: 8 g/dL — ABNORMAL LOW (ref 12.0–15.0)
MCH: 20.9 pg — AB (ref 26.0–34.0)
MCHC: 29.1 g/dL — AB (ref 30.0–36.0)
MCV: 71.8 fL — ABNORMAL LOW (ref 78.0–100.0)
PLATELETS: 191 10*3/uL (ref 150–400)
RBC: 3.83 MIL/uL — AB (ref 3.87–5.11)
RDW: 18.9 % — AB (ref 11.5–15.5)
WBC: 6.4 10*3/uL (ref 4.0–10.5)

## 2017-08-08 LAB — MAGNESIUM: MAGNESIUM: 1.8 mg/dL (ref 1.7–2.4)

## 2017-08-08 LAB — GLUCOSE, CAPILLARY
Glucose-Capillary: 191 mg/dL — ABNORMAL HIGH (ref 65–99)
Glucose-Capillary: 252 mg/dL — ABNORMAL HIGH (ref 65–99)
Glucose-Capillary: 269 mg/dL — ABNORMAL HIGH (ref 65–99)
Glucose-Capillary: 249 mg/dL — ABNORMAL HIGH (ref 65–99)

## 2017-08-08 MED ORDER — FUROSEMIDE 10 MG/ML IJ SOLN
120.0000 mg | Freq: Two times a day (BID) | INTRAVENOUS | Status: DC
  Administered 2017-08-08 – 2017-08-12 (×8): 120 mg via INTRAVENOUS
  Filled 2017-08-08 (×2): qty 10
  Filled 2017-08-08 (×4): qty 12
  Filled 2017-08-08: qty 2
  Filled 2017-08-08: qty 4
  Filled 2017-08-08: qty 10

## 2017-08-08 MED ORDER — INSULIN GLARGINE 100 UNIT/ML ~~LOC~~ SOLN
20.0000 [IU] | Freq: Every day | SUBCUTANEOUS | Status: DC
  Administered 2017-08-09 – 2017-08-10 (×2): 20 [IU] via SUBCUTANEOUS
  Filled 2017-08-08 (×3): qty 0.2

## 2017-08-08 MED ORDER — METOLAZONE 2.5 MG PO TABS
2.5000 mg | ORAL_TABLET | Freq: Once | ORAL | Status: AC
  Administered 2017-08-08: 2.5 mg via ORAL
  Filled 2017-08-08: qty 1

## 2017-08-08 NOTE — Progress Notes (Addendum)
Patient ID: [name], female   DOB: December 09, 1965, 51 y.o.   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  Assessment & Plan:  Acute on chronic dchf exacerbation: - Echocardiogram in 2017 with preserved left ventricular EF - Stress test in August 2017 unremarkable - continue Lasix but dose increased from 80 --> 120 mg IV - Weight trend since admission Filed Weights   08/06/17 0527 08/07/17 0638 08/08/17 0500  Weight: (!) 158.2 kg (348 lb 12.3 oz) (!) 159.5 kg (351 lb 9.6 oz) (!) 159.4 kg (351 lb 6.6 oz)  - follow up on cardiology team recommendations  - daily weights and strict I/O - added metolazone per cardiology   Anemia of chronic disease - No evidence of active bleeding - Iron levels low, oral supplementation already started - overall stable, CBC in AM  Non-insulin-dependent DM type II - Last A1c in June 10.2 - Repeat A1c 9.7 - Continue sliding scale insulin - has been on 15 U but will increase to 20 U for better control   Hypomagnesemia - has been supplemented - check again in AM  HTN - reasonable inpatient control  - continue Cozaar, Coreg, Lasix   Mediastinal adenopathy/Mycobacterium avium complex (HCC) - Not currently treated, ace wnl, negative quantiferon test, negative HIV, followed by Dr Lamonte Sakai,   OSA - CPAP QHS  Depression/anxiety: - stable   DVT prophylaxis: Lovenox SQ Code Status: Full  Family Communication: Patient at bedside Disposition Plan: to be determined   Consultants:   PCCM   Cardiology   Procedures:    None  Antimicrobials:   None  Subjective: Pt reports overall improvement.   Objective: Vitals:   08/07/17 0638 08/07/17 1455 08/07/17 2031 08/08/17 0500  BP:  (!) 114/54 124/64 134/65  Pulse:  71 65 (!) 59  Resp:  _0 Temp:  (!) 97.4 F (36.3 C) 98 F (36.7 C) 97.9 F (36.6 C)  TempSrc:  Axillary Oral Oral  SpO2:  95% 97% 94%  Weight: (!) 159.5 kg (351 lb 9.6 oz)   (!) 159.4 kg (351 lb 6.6 oz)  Height:        Intake/Output Summary (Last 24 hours) at 08/08/2017 1239 Last data filed at 08/08/2017 0954 Gross per 24 hour  Intake 720 ml  Output 1900 ml  Net -1180 ml   Filed Weights   08/06/17 0527 08/07/17 0638 08/08/17 0500  Weight: (!) 158.2 kg (348 lb 12.3 oz) (!) 159.5 kg (351 lb 9.6 oz) (!) 159.4 kg (351 lb 6.6 oz)   Physical Exam  Constitutional: Appears calm, NAD CVS: RRR, S1/S2 +, no murmurs, no gallops, no carotid bruit.  Pulmonary: Effort and breath sounds normal, no stridor Abdominal: Soft. BS +,  no distension, tenderness, rebound or guarding.  Musculoskeletal: Normal range of motion.+1 bilateral LE edema   Data Reviewed: I have personally reviewed following labs and imaging studies  CBC: Recent Labs  Lab 08/04/17 0450 08/05/17 0418 08/06/17 0451 08/07/17 0459 08/08/17 0551  WBC 6.3 6.4 7.2 7.4 6.4  HGB 8.0* 8.1* 7.7* 7.9* 8.0*  HCT 27.5* 28.6* 26.9* 27.9* 27.5*  MCV 71.6* 72.0* 71.9* 72.8*  71.8*  PLT 257 259 246 249 427   Basic Metabolic Panel: Recent Labs  Lab 08/04/17 0450 08/05/17 0418 08/06/17 0451 08/07/17 0459 08/08/17 0551  NA 139 137 137 137 137  K 3.9 3.8 3.7 3.8 3.9  CL 103 101 101 99* 100*  CO2 _0 GLUCOSE 212* 221* 274* 204* 218*  BUN 23* 25* 27* 27* 23*  CREATININE 0.80 0.77 0.83 0.76 0.70  CALCIUM 9.2 8.9 8.9 8.7* 8.7*  MG 1.5* 1.6* 1.7 1.7 1.8   Liver Function Tests: Recent Labs  Lab 08/03/17 0553  AST 17  ALT 11*  ALKPHOS 103  BILITOT 0.5  PROT 7.3  ALBUMIN 3.5   CBG: Recent Labs   Lab 08/07/17 1156 08/07/17 1630 08/07/17 2141 08/08/17 0732 08/08/17 1129  GLUCAP 273* 225* 230* 191* 252*   Urine analysis:    Component Value Date/Time   COLORURINE YELLOW 08/06/2016 1245   APPEARANCEUR TURBID (A) 08/06/2016 1245   LABSPEC 1.028 08/06/2016 1245   PHURINE 5.5 08/06/2016 1245   GLUCOSEU 2+ (A) 08/06/2016 1245   HGBUR NEGATIVE 08/06/2016 1245   BILIRUBINUR NEGATIVE 08/06/2016 1245   BILIRUBINUR neg 09/10/2014 1219   KETONESUR NEGATIVE 08/06/2016 1245   PROTEINUR 2+ (A) 08/06/2016 1245   UROBILINOGEN 1.0 09/10/2014 1219   UROBILINOGEN 0.2 12/07/2013 0041   NITRITE NEGATIVE 08/06/2016 1245   LEUKOCYTESUR NEGATIVE 08/06/2016 1245    Radiology Studies: No results found.  Scheduled Meds: . aspirin EC  81 mg Oral Daily  . carvedilol  25 mg Oral BID  . enoxaparin (LOVENOX) injection  80 mg Subcutaneous Q24H  . ferrous sulfate  325 mg Oral TID WC  . insulin aspart  0-20 Units Subcutaneous TID WC  . insulin glargine  15 Units Subcutaneous Daily  . losartan  100 mg Oral Daily  . metolazone  2.5 mg Oral Once  . oxybutynin  15 mg Oral QHS  . risperiDONE  4 mg Oral Daily  . senna-docusate  1 tablet Oral BID   Continuous Infusions: . furosemide       LOS: 4 days   Time spent: 25 minutes   Faye Ramsay, MD [facility] Pager [phone]  If 7PM-7AM, please contact night-coverage [email or web address] Password [facility] 08/08/2017, 12:39 PM

## 2017-08-08 NOTE — Progress Notes (Signed)
Inpatient Diabetes Program Recommendations  AACE/ADA: New Consensus Statement on Inpatient Glycemic Control (2015)  Target Ranges:  Prepandial:   less than 140 mg/dL      Peak postprandial:   less than 180 mg/dL (1-2 hours)      Critically ill patients:  140 - 180 mg/dL   Lab Results  Component Value Date   GLUCAP 252 (H) 08/08/2017   HGBA1C 9.7 (H) 08/06/2017    Review of Glycemic Control  Diabetes history: DM2 Outpatient Diabetes medications: glipizide 10 mg QD, metformin 1000 mg bid Current orders for Inpatient glycemic control: Lantus 20 units QD, Novolog 0-20 units tidwc  Will likely need to go home on affordable insulin. Post-prandials elevated. 11/10 Novolog correction - 22 units  Inpatient Diabetes Program Recommendations:   Add HS correction Add Novolog 4 units tidwc for meal coverage insulin. May need to convert to 70/30 at discharge with no insurance coverage.  Thank you. Lorenda Peck, RD, LDN, CDE Inpatient Diabetes Coordinator [phone]

## 2017-08-08 NOTE — Progress Notes (Signed)
Progress Note  Patient Name: [name] Date of Encounter: 08/08/2017  Primary Cardiologist: [facility]     Patient Profile    [name] is a 51 y.o. female with a hx of hypertensive heart disease, chronic diastolic HF, HTN, DM, obesity and OSA, who is being seen today for the evaluation of acute on chronic CHF   Echo NORMAL  LV function with large amount of Mitral annular calcification    Subjective  Feeling  Better but still very sob  Inpatient Medications    Scheduled Meds: . aspirin EC  81 mg Oral Daily  . carvedilol  25 mg Oral BID  . enoxaparin (LOVENOX) injection  80 mg Subcutaneous Q24H  . ferrous sulfate  325 mg Oral TID WC  . furosemide  80 mg Intravenous Q12H  . insulin aspart  0-20 Units Subcutaneous TID WC  . insulin glargine  15 Units Subcutaneous Daily  . losartan  100 mg Oral Daily  . oxybutynin  15 mg Oral QHS  . risperiDONE  4 mg Oral Daily  . senna-docusate  1 tablet Oral BID   Continuous Infusions:  PRN Meds: nitroGLYCERIN, oxyCODONE   Vital Signs    Vitals:   08/07/17 0638 08/07/17 1455 08/07/17 2031 08/08/17 0500  BP:  (!) 114/54 124/64 134/65  Pulse:  71 65 (!) 59  Resp:  _0 Temp:  (!) 97.4 F (36.3 C) 98 F (36.7 C) 97.9 F (36.6 C)  TempSrc:  Axillary Oral Oral  SpO2:  95% 97% 94%  Weight: (!) 351 lb 9.6 oz (159.5 kg)   (!) 351 lb 6.6 oz (159.4 kg)  Height:        Intake/Output Summary (Last 24 hours) at 08/08/2017 1017 Last data filed at 08/08/2017 0954 Gross per 24 hour  Intake 1080 ml  Output 1900 ml  Net -820 ml   Filed Weights   08/06/17 0527 08/07/17 0638 08/08/17 0500  Weight: (!) 348 lb 12.3 oz (158.2 kg) (!) 351 lb 9.6 oz (159.5 kg) (!) 351 lb 6.6 oz (159.4 kg)    Telemetry   sinus - Personally Reviewed  ECG    Sinus with RAD  Dating back to 3/18  NO LVH ??   Physical Exam    GEN: No acute distress.   Neck: JVD unable to discern Cardiac: RRR, no  murmurs, rubs, or gallops.  Respiratory: Clear  to auscultation bilaterally. GI: Soft, nontender, non-distended  MS: 1-2+ edema; No deformity. Neuro:  Nonfocal  Psych: Normal affect  Skin Warm and dry   Labs    Chemistry Recent Labs  Lab 08/03/17 0553  08/06/17 0451 08/07/17 0459 08/08/17 0551  NA 138   < > 137 137 137  K 3.9   < > 3.7 3.8 3.9  CL 103   < > 101 99* 100*  CO2 25   < > _1 GLUCOSE 128*   < > 274* 204* 218*  BUN 16   < > 27* 27* 23*  CREATININE 0.60   < > 0.83 0.76 0.70  CALCIUM 9.0   < > 8.9 8.7* 8.7*  PROT 7.3  --   --   --   --   ALBUMIN 3.5  --   --   --   --   AST 17  --   --   --   --   ALT 11*  --   --   --   --   [facility]  103  --   --   --   --   BILITOT 0.5  --   --   --   --   GFRNONAA >60   < > >60 >60 >60  GFRAA >60   < > >60 >60 >60  ANIONGAP 10   < > _0 < > = values in this interval not displayed.     Hematology Recent Labs  Lab 08/06/17 0451 08/07/17 0459 08/08/17 0551  WBC 7.2 7.4 6.4  RBC 3.74* 3.83* 3.83*  HGB 7.7* 7.9* 8.0*  HCT 26.9* 27.9* 27.5*  MCV 71.9* 72.8* 71.8*  MCH 20.6* 20.6* 20.9*  MCHC 28.6* 28.3* 29.1*  RDW 18.6* 18.9* 18.9*  PLT 246 249 191    Cardiac EnzymesNo results for input(s): TROPONINI in the last 168 hours.  Recent Labs  Lab 08/02/17 1928 08/03/17 0607  TROPIPOC 0.01 0.00     BNP Recent Labs  Lab 08/03/17 0553  BNP 250.5*     DDimer  Recent Labs  Lab 08/03/17 0553  DDIMER 0.48     Radiology    No results found.  Cardiac Studies  Echo  LVEF  Normal LVH (15 mm)  L & RAE (56.2.0/42) 1.3 cm mass on Mitral annular calcificied lesion     Assessment & Plan    Diastolic heart failure acute on chronic  Hypertension with hypertensive heart disease  Morbid obesity super   Chest pain-atypical  Hypertension  Microcytic anemia  Mitral valve calcified mass  Personally reviewed  With Dr SMcD  Most consistent w MAC  R sided lower extremity numbness     Have suggested she consider obesity reduction surgery  Her  brother is surgeon in [place] and they will discuss  BP well controlled at present   Increase diuretic  Will increase furosemide and give one dose of metalazone   LVH  There is some discrepancy between the Echo and ECG, but her hx of long standing hypertension likely explain LVH  In this regard, that African american LVH w/ which we are so familiar is limited to [place] african descent, and this lady is from [place]  We have to keep HCM or phenocopies in mind     Signed, Virl Axe, MD  08/08/2017, 10:17 AM

## 2017-08-09 LAB — CBC
HCT: 28.2 % — ABNORMAL LOW (ref 36.0–46.0)
Hemoglobin: 8 g/dL — ABNORMAL LOW (ref 12.0–15.0)
MCH: 21.1 pg — AB (ref 26.0–34.0)
MCHC: 28.4 g/dL — ABNORMAL LOW (ref 30.0–36.0)
MCV: 74.4 fL — ABNORMAL LOW (ref 78.0–100.0)
PLATELETS: 220 10*3/uL (ref 150–400)
RBC: 3.79 MIL/uL — AB (ref 3.87–5.11)
RDW: 19.9 % — AB (ref 11.5–15.5)
WBC: 7.2 10*3/uL (ref 4.0–10.5)

## 2017-08-09 LAB — BASIC METABOLIC PANEL
Anion gap: 9 (ref 5–15)
BUN: 22 mg/dL — ABNORMAL HIGH (ref 6–20)
CALCIUM: 8.9 mg/dL (ref 8.9–10.3)
CO2: 33 mmol/L — ABNORMAL HIGH (ref 22–32)
CREATININE: 0.81 mg/dL (ref 0.44–1.00)
Chloride: 97 mmol/L — ABNORMAL LOW (ref 101–111)
GFR calc non Af Amer: >60 mL/min (ref >60)
Glucose, Bld: 176 mg/dL — ABNORMAL HIGH (ref 65–99)
Potassium: 3.3 mmol/L — ABNORMAL LOW (ref 3.5–5.1)
SODIUM: 139 mmol/L (ref 135–145)

## 2017-08-09 LAB — GLUCOSE, CAPILLARY
GLUCOSE-CAPILLARY: 170 mg/dL — AB (ref 65–99)
GLUCOSE-CAPILLARY: 239 mg/dL — AB (ref 65–99)
Glucose-Capillary: 223 mg/dL — ABNORMAL HIGH (ref 65–99)
Glucose-Capillary: 264 mg/dL — ABNORMAL HIGH (ref 65–99)

## 2017-08-09 MED ORDER — INSULIN ASPART 100 UNIT/ML ~~LOC~~ SOLN: 0.0000 [IU] | Freq: Three times a day (TID) | SUBCUTANEOUS | Status: DC

## 2017-08-09 MED ORDER — POTASSIUM CHLORIDE CRYS ER 20 MEQ PO TBCR
40.0000 meq | EXTENDED_RELEASE_TABLET | Freq: Once | ORAL | Status: AC
  Administered 2017-08-09: 40 meq via ORAL
  Filled 2017-08-09: qty 2

## 2017-08-09 MED ORDER — INSULIN ASPART 100 UNIT/ML ~~LOC~~ SOLN
0.0000 [IU] | Freq: Every day | SUBCUTANEOUS | Status: DC
  Administered 2017-08-09 – 2017-08-10 (×2): 3 [IU] via SUBCUTANEOUS
  Administered 2017-08-11: 4 [IU] via SUBCUTANEOUS
  Administered 2017-08-12: 2 [IU] via SUBCUTANEOUS

## 2017-08-09 NOTE — Progress Notes (Addendum)
Inpatient Diabetes Program Recommendations  AACE/ADA: New Consensus Statement on Inpatient Glycemic Control (2015)  Target Ranges:  Prepandial:   less than 140 mg/dL      Peak postprandial:   less than 180 mg/dL (1-2 hours)      Critically ill patients:  140 - 180 mg/dL   Results for [name], [name] (MRN [id number]) as of 08/09/2017 12:15  Ref. Range 08/08/2017 07:32 08/08/2017 11:29 08/08/2017 16:36 08/08/2017 20:37  Glucose-Capillary Latest Ref Range: 65 - 99 mg/dL 191 (H) 252 (H) 269 (H) 249 (H)   Results for [name], [name] (MRN [id number]) as of 08/09/2017 12:15  Ref. Range 08/09/2017 07:32 08/09/2017 12:01  Glucose-Capillary Latest Ref Range: 65 - 99 mg/dL 170 (H) 223 (H)   Results for [name], [name] (MRN [id number]) as of 08/09/2017 12:15  Ref. Range 03/24/2017 15:42 08/06/2017 04:51  Hemoglobin A1C Latest Ref Range: 4.8 - 5.6 % 10.2 9.7 (H)    Home DM Meds: Metformin 1000 mg BID       Glipizide 10 mg daily  Current Insulin Orders: Lantus 20 units daily      Novolog Resistant Correction Scale/ SSI (0-20 units) TID AC        Met with patient this AM.  Patient told me she recently stopped taking Farxiga (dapagliflozin) b/c it made her feel "crazy".  Has taken insulin in the past, but does not want to restart insulin at home b/c she stated the insulin "put her in a coma".  I asked pt to explain what she meant by "coma" but all she would describe to me was that the insulin gave her Hypoglycemia.  Patient very resistant to starting insulin at home again.  Discussed with patient that she needs to get her wight and diet under control.  Pt admits to drinking fruit juice and other beverages with sugar.  Likely eating foods high in carbohydrates and also likely eating too many sweets.  Encouraged pt to check her CBGs at least BID at home and to record for her PCP at the [facility] clinic at [facility].  Reminded pt that her goal A1c is 7% or less to prevent both acute and chronic complications.  Also  reviewed CBG goals for home.     MD- Patient very resistant to restarting insulin for home.  Not sure she will be willing to take insulin.  Have asked the RD to provide basic DM diet education with patient.  Not sure patient will comply with dietary instructions.  If the [facility] [place] has any DPP-4 inhibitor medications in stock, we could start patient on one of these drugs in addition to her Metformin and Glipizide.  We could try adding Tradjenta 5 mg daily or Januvia 50 mg daily.  These meds are expensive and if they can't be provided through the clinic, patient will likely not be able to afford them out of pocket.  Attempted to call the [facility] pharmacy today, but was unable to speak to any one on staff over there.      --Will follow patient during hospitalization--  Wyn Quaker RN, MSN, CDE Diabetes Coordinator Inpatient Glycemic Control Team Team Pager: [phone] (8a-5p)

## 2017-08-09 NOTE — Progress Notes (Signed)
Patient ID: [name], female   DOB: 1965/11/27, 51 y.o.   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  Assessment & Plan:  Acute on chronic  diastolic CHF exacerbation: - Echocardiogram in 2017 with preserved left ventricular EF - Stress test in August 2017 unremarkable - continue Lasix 120 mg IV BID, pt clinically improving  - Weight trend since admission Filed Weights   08/07/17 0638 08/08/17 0500 08/09/17 0421  Weight: (!) 159.5 kg (351 lb 9.6 oz) (!) 159.4 kg (351 lb 6.6 oz) (!) 158.6 kg (349 lb 10.4 oz)  - follow-up on cardiology team recommendations - Metolazone added as well - Monitor daily weights, strict intake and urine output  Anemia of chronic disease - No evidence of active bleeding - Iron levels low, oral supplementation already started - Hemoglobin overall stable  Non-insulin-dependent DM type II - Last A1c in June 10.2 - Repeat A1c 9.7 - Continue Lantus but increase to 20 units - Change sliding scale to resistant  Hypomagnesemia - Has been supplemented  Hypokalemia - From Lasix, supplement and repeat BMP in the morning  HTN - Reasonable inpatient control - continue Cozaar, Coreg, Lasix   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  DVT prophylaxis: Lovenox SQ Code Status: Full  Family Communication: Patient at  bedside Disposition Plan: To be determined   Consultants:   PCCM   Cardiology   Procedures:   None  Antimicrobials:   None  Subjective: Patient reports feeling better, able to lay down flat.  Objective: Vitals:   08/08/17 0500 08/08/17 1423 08/08/17 2036 08/09/17 0421  BP: 134/65 134/66 (!) 150/74 115/77  Pulse: (!) 59 67 71 71  Resp: 20 (!) 22 20 20   Temp: 97.9 F (36.6 C) 98.1 F (36.7 C) 98.2 F (36.8 C) 98.1 F (36.7 C)  TempSrc: Oral Oral Oral Oral  SpO2: 94% 93% 96% 99%  Weight: (!) 159.4 kg (351 lb 6.6 oz)   (!) 158.6 kg (349 lb 10.4 oz)  Height:        Intake/Output Summary (Last 24 hours) at 08/09/2017 1305 Last data filed at 08/09/2017 1040 Gross per 24 hour  Intake 662 ml  Output 4100 ml  Net -3438 ml   Filed Weights   08/07/17 0638 08/08/17 0500 08/09/17 0421  Weight: (!) 159.5 kg (351 lb 9.6 oz) (!) 159.4 kg (351 lb 6.6 oz) (!) 158.6 kg (349 lb 10.4 oz)   Physical Exam  Constitutional: Appears well-developed and well-nourished. No distress.  CVS: RRR, S1/S2 +, no murmurs, no gallops, no carotid bruit.  Pulmonary: Effort and breath sounds normal, no stridor Abdominal: Soft. BS +,  no distension, tenderness, rebound or guarding.  Musculoskeletal: Normal range of motion. +1 bilateral lower extremity pitting edema   Data Reviewed: I have personally reviewed following labs and imaging studies  CBC: Recent Labs  Lab 08/05/17 0418 08/06/17 0451 08/07/17 0459 08/08/17 0551 08/09/17 0422  WBC 6.4 7.2 7.4  6.4 7.2  HGB 8.1* 7.7* 7.9* 8.0* 8.0*  HCT 28.6* 26.9* 27.9* 27.5* 28.2*  MCV 72.0* 71.9* 72.8* 71.8* 74.4*  PLT 259 246 249 191 979   Basic Metabolic Panel: Recent Labs  Lab 08/04/17 0450 08/05/17 0418 08/06/17 0451 08/07/17 0459 08/08/17 0551 08/09/17 0422  NA 139 137 137 137 137 139  K 3.9 3.8 3.7 3.8 3.9 3.3*  CL 103 101 101 99* 100* 97*  CO2 26 26 27 27 29  33*  GLUCOSE 212* 221* 274* 204* 218* 176*  BUN 23* 25* 27* 27* 23*  22*  CREATININE 0.80 0.77 0.83 0.76 0.70 0.81  CALCIUM 9.2 8.9 8.9 8.7* 8.7* 8.9  MG 1.5* 1.6* 1.7 1.7 1.8  --    Liver Function Tests: Recent Labs  Lab 08/03/17 0553  AST 17  ALT 11*  ALKPHOS 103  BILITOT 0.5  PROT 7.3  ALBUMIN 3.5   CBG: Recent Labs  Lab 08/08/17 1129 08/08/17 1636 08/08/17 2037 08/09/17 0732 08/09/17 1201  GLUCAP 252* 269* 249* 170* 223*   Urine analysis:    Component Value Date/Time   COLORURINE YELLOW 08/06/2016 1245   APPEARANCEUR TURBID (A) 08/06/2016 1245   LABSPEC 1.028 08/06/2016 1245   PHURINE 5.5 08/06/2016 1245   GLUCOSEU 2+ (A) 08/06/2016 1245   HGBUR NEGATIVE 08/06/2016 1245   BILIRUBINUR NEGATIVE 08/06/2016 1245   BILIRUBINUR neg 09/10/2014 [place] 08/06/2016 1245   PROTEINUR 2+ (A) 08/06/2016 1245   UROBILINOGEN 1.0 09/10/2014 1219   UROBILINOGEN 0.2 12/07/2013 0041   NITRITE NEGATIVE 08/06/2016 1245   LEUKOCYTESUR NEGATIVE 08/06/2016 1245    Radiology Studies: No results found.  Scheduled Meds: . aspirin EC  81 mg Oral Daily  . carvedilol  25 mg Oral BID  . enoxaparin (LOVENOX) injection  80 mg Subcutaneous Q24H  . ferrous sulfate  325 mg Oral TID WC  . insulin aspart  0-20 Units Subcutaneous TID WC  . insulin aspart  0-5 Units Subcutaneous QHS  . insulin glargine  20 Units Subcutaneous Daily  . losartan  100 mg Oral Daily  . oxybutynin  15 mg Oral QHS  . risperiDONE  4 mg Oral Daily  . senna-docusate  1 tablet Oral BID   Continuous Infusions: . furosemide Stopped (08/09/17 1227)     LOS: 5 days   Time spent: 25 minutes   Faye Ramsay, MD [facility] Pager [phone]  If 7PM-7AM, please contact night-coverage [email or web address] Password [email or web address] 08/09/2017, 1:05 PM

## 2017-08-09 NOTE — Progress Notes (Signed)
Pt unable to tolerate CPAP.  RT to monitor and assess as needed.

## 2017-08-09 NOTE — Progress Notes (Signed)
Progress Note  Patient Name: [name] Date of Encounter: 08/09/2017  Primary Cardiologist: Dr. [name]  Subjective   Breathing improved but not yet at baseline. She has been ambulating to the nurses station. Denies any chest pain or palpitations.   Inpatient Medications    Scheduled Meds: . aspirin EC  81 mg Oral Daily  . carvedilol  25 mg Oral BID  . enoxaparin (LOVENOX) injection  80 mg Subcutaneous Q24H  . ferrous sulfate  325 mg Oral TID WC  . insulin aspart  0-20 Units Subcutaneous TID WC  . insulin aspart  0-5 Units Subcutaneous QHS  . insulin glargine  20 Units Subcutaneous Daily  . losartan  100 mg Oral Daily  . oxybutynin  15 mg Oral QHS  . potassium chloride  40 mEq Oral Once  . risperiDONE  4 mg Oral Daily  . senna-docusate  1 tablet Oral BID   Continuous Infusions: . furosemide 120 mg (08/09/17 0821)   PRN Meds: nitroGLYCERIN, oxyCODONE   Vital Signs    Vitals:   08/08/17 0500 08/08/17 1423 08/08/17 2036 08/09/17 0421  BP: 134/65 134/66 (!) 150/74 115/77  Pulse: (!) 59 67 71 71  Resp: 20 (!) 22 20 20   Temp: 97.9 F (36.6 C) 98.1 F (36.7 C) 98.2 F (36.8 C) 98.1 F (36.7 C)  TempSrc: Oral Oral Oral Oral  SpO2: 94% 93% 96% 99%  Weight: (!) 351 lb 6.6 oz (159.4 kg)   (!) 349 lb 10.4 oz (158.6 kg)  Height:        Intake/Output Summary (Last 24 hours) at 08/09/2017 1022 Last data filed at 08/09/2017 0939 Gross per 24 hour  Intake 902 ml  Output 3900 ml  Net -2998 ml   Filed Weights   08/07/17 0638 08/08/17 0500 08/09/17 0421  Weight: (!) 351 lb 9.6 oz (159.5 kg) (!) 351 lb 6.6 oz (159.4 kg) (!) 349 lb 10.4 oz (158.6 kg)    Telemetry    NSR, HR in 70's to 80's. Occasional PVC's. - Personally Reviewed  ECG    No new tracings.   Physical Exam   General: Well developed, obese female appearing in no acute distress. Head: Normocephalic, atraumatic.  Neck: Supple without bruits, JVD difficult to assess secondary to body  habitus. Lungs:  Resp regular and unlabored, decreased breath sounds along bases bilaterally. Heart: RRR, S1, S2, no S3, S4, or murmur; no rub. Abdomen: Soft, non-tender, non-distended with normoactive bowel sounds. No hepatomegaly. No rebound/guarding. No obvious abdominal masses. Extremities: No clubbing, cyanosis, 1+ pitting edema bilaterally. Distal pedal pulses are 2+ bilaterally. Neuro: Alert and oriented X 3. Moves all extremities spontaneously. Psych: Normal affect.  Labs    Chemistry Recent Labs  Lab 08/03/17 0553  08/07/17 0459 08/08/17 0551 08/09/17 0422  NA 138   < > 137 137 139  K 3.9   < > 3.8 3.9 3.3*  CL 103   < > 99* 100* 97*  CO2 25   < > 27 29 33*  GLUCOSE 128*   < > 204* 218* 176*  BUN 16   < > 27* 23* 22*  CREATININE 0.60   < > 0.76 0.70 0.81  CALCIUM 9.0   < > 8.7* 8.7* 8.9  PROT 7.3  --   --   --   --   ALBUMIN 3.5  --   --   --   --   AST 17  --   --   --   --  ALT 11*  --   --   --   --   ALKPHOS 103  --   --   --   --   BILITOT 0.5  --   --   --   --   GFRNONAA >60   < > >60 >60 >60  GFRAA >60   < > >60 >60 >60  ANIONGAP 10   < > 11 8 9    < > = values in this interval not displayed.     Hematology Recent Labs  Lab 08/07/17 0459 08/08/17 0551 08/09/17 0422  WBC 7.4 6.4 7.2  RBC 3.83* 3.83* 3.79*  HGB 7.9* 8.0* 8.0*  HCT 27.9* 27.5* 28.2*  MCV 72.8* 71.8* 74.4*  MCH 20.6* 20.9* 21.1*  MCHC 28.3* 29.1* 28.4*  RDW 18.9* 18.9* 19.9*  PLT 249 191 220    Cardiac EnzymesNo results for input(s): TROPONINI in the last 168 hours.  Recent Labs  Lab 08/02/17 1928 08/03/17 0607  TROPIPOC 0.01 0.00     BNP Recent Labs  Lab 08/03/17 0553  BNP 250.5*     DDimer  Recent Labs  Lab 08/03/17 0553  DDIMER 0.48     Radiology    No results found.  Cardiac Studies   Echocardiogram: 08/06/2017 Study Conclusions  - Left ventricle: The cavity size was normal. Wall thickness was   increased in a pattern of moderate LVH. Systolic  function was   normal. The estimated ejection fraction was in the range of 55%   to 60%. Wall motion was normal; there were no regional wall   motion abnormalities. Features are consistent with a pseudonormal   left ventricular filling pattern, with concomitant abnormal   relaxation and increased filling pressure (grade 2 diastolic   dysfunction). - Mitral valve: Moderately to severely calcified annulus. Valve   area by continuity equation (using LVOT flow): 1.8 cm^2. - Left atrium: The atrium was moderately dilated. - Right atrium: The atrium was mildly to moderately dilated. - Tricuspid valve: There was mild-moderate regurgitation. - Pulmonary arteries: Systolic pressure was moderately increased.   PA peak pressure: 58 mm Hg (S).  Patient Profile     51 y.o. female with PMH of hypertensive heart disease, chronic diastolic HF, HTN, Type 2 DM, obesity and OSA (on CPAP) who presented to [facility] ED on 08/02/2017 for evaluation of worsening dyspnea, admitted for CHF exacerbation.   Assessment & Plan    1. Acute on chronic Diastolic CHF - presented with worsening dyspnea at rest and on exertion in the setting of medication non-compliance with home diuretics.  - BNP at 250 on admission with CXR showing interstitial edema.  - she was initially placed on IV Lasix 80mg  BID with intermittent doses of Metolazone. IV Lasix has been titrated to 120mg  BID (increased this AM) with a net output of -10.4L thus far, however recorded weight has been stable at 347 - 349 lbs (weight was 335 lbs at the time of her office visit in 12/2016). Kidney function remains stable at 0.81. Agree with dose adjustment. Continue to monitor I&O's along with daily weights. K+ 3.3 this AM and is scheduled to be replaced.   2. Atypical Chest Pain - reported chest pain at that time of admission. Troponin value was negative and EKG showed no acute ischemic changes.  - NST in 04/2016 showed no evidence of ischemia and was low-risk. -  no plans for further ischemic evaluation at this time.   3. HTN - BP has been  stable at 115/66 - 150/77 within the past 24 hours.  - continue current medication regimen.   4. Microcytic anemia - baseline 10.0 - 11.0, at 8.1 on admission.  - she denies any evidence of active bleeding. Has been started on iron supplementation.  - per admitting team.   5. Morbid Obesity - BMI at 54. Diet and exercise strongly encouraged.  - she is interested in establishing care with a Weight Management clinic at the time of hospital discharge.    Signed, Erma Heritage , PA-C 10:22 AM 08/09/2017 Pager: [phone]

## 2017-08-10 DIAGNOSIS — I279 Pulmonary heart disease, unspecified: Secondary | ICD-10-CM

## 2017-08-10 DIAGNOSIS — J111 Influenza due to unidentified influenza virus with other respiratory manifestations: Secondary | ICD-10-CM

## 2017-08-10 LAB — BASIC METABOLIC PANEL
ANION GAP: 8 (ref 5–15)
BUN: 21 mg/dL — AB (ref 6–20)
CHLORIDE: 97 mmol/L — AB (ref 101–111)
CO2: 32 mmol/L (ref 22–32)
Calcium: 8.8 mg/dL — ABNORMAL LOW (ref 8.9–10.3)
Creatinine, Ser: 0.74 mg/dL (ref 0.44–1.00)
GFR calc Af Amer: >60 mL/min (ref >60)
GLUCOSE: 225 mg/dL — AB (ref 65–99)
POTASSIUM: 3.6 mmol/L (ref 3.5–5.1)
Sodium: 137 mmol/L (ref 135–145)

## 2017-08-10 LAB — CBC
HCT: 30.7 % — ABNORMAL LOW (ref 36.0–46.0)
HEMOGLOBIN: 8.9 g/dL — AB (ref 12.0–15.0)
MCH: 21.7 pg — AB (ref 26.0–34.0)
MCHC: 29 g/dL — ABNORMAL LOW (ref 30.0–36.0)
MCV: 74.9 fL — AB (ref 78.0–100.0)
PLATELETS: 219 10*3/uL (ref 150–400)
RBC: 4.1 MIL/uL (ref 3.87–5.11)
RDW: 20.6 % — ABNORMAL HIGH (ref 11.5–15.5)
WBC: 6.5 10*3/uL (ref 4.0–10.5)

## 2017-08-10 LAB — GLUCOSE, CAPILLARY
GLUCOSE-CAPILLARY: 233 mg/dL — AB (ref 65–99)
GLUCOSE-CAPILLARY: 221 mg/dL — AB (ref 65–99)
GLUCOSE-CAPILLARY: 276 mg/dL — AB (ref 65–99)
Glucose-Capillary: 185 mg/dL — ABNORMAL HIGH (ref 65–99)

## 2017-08-10 LAB — OCCULT BLOOD X 1 CARD TO LAB, STOOL: FECAL OCCULT BLD: POSITIVE — AB

## 2017-08-10 LAB — MAGNESIUM: Magnesium: 1.6 mg/dL — ABNORMAL LOW (ref 1.7–2.4)

## 2017-08-10 MED ORDER — MAGNESIUM SULFATE 2 GM/50ML IV SOLN
2.0000 g | Freq: Once | INTRAVENOUS | Status: DC
  Filled 2017-08-10: qty 50

## 2017-08-10 NOTE — Progress Notes (Addendum)
Inpatient Diabetes Program Recommendations  AACE/ADA: New Consensus Statement on Inpatient Glycemic Control (2015)  Target Ranges:  Prepandial:   less than 140 mg/dL      Peak postprandial:   less than 180 mg/dL (1-2 hours)      Critically ill patients:  140 - 180 mg/dL   Results for [name], [name] (MRN [id number]) as of 08/10/2017 08:09  Ref. Range 08/09/2017 07:32 08/09/2017 12:01 08/09/2017 17:17 08/09/2017 21:38  Glucose-Capillary Latest Ref Range: 65 - 99 mg/dL 170 (H) 223 (H) 239 (H) 264 (H)    Home DM Meds: Metformin 1000 mg BID                             Glipizide 10 mg daily  Current Insulin Orders: Lantus 20 units daily                                       Novolog Resistant Correction Scale/ SSI (0-20 units) TID AC      Met with patient yesterday (11/12).  Patient told me she recently stopped taking Farxiga (dapagliflozin) b/c it made her feel "crazy".  Has taken insulin in the past, but does not want to restart insulin at home b/c she stated the insulin "put her in a coma".  I asked pt to explain what she meant by "coma" but all she would describe to me was that the insulin gave her Hypoglycemia.  Patient very resistant to starting insulin at home again.  Discussed with patient yesterday that she needs to get her wight and diet under control.  Pt admits to drinking fruit juice and other beverages with sugar.  Likely eating foods high in carbohydrates and also likely eating too many sweets.  Encouraged pt yesteday to check her CBGs at least BID at home and to record for her PCP at the [facility] clinic at [facility].  Reminded pt that her goal A1c is 7% or less to prevent both acute and chronic complications.  Also reviewed CBG goals for home.     MD- Patient very resistant to restarting insulin for home.  Not sure she will be willing to take insulin.  Have asked the RD to provide basic DM diet education with patient.  Not sure patient will comply with dietary  instructions.  If the [facility] [place] has any DPP-4 inhibitor medications in stock, we could start patient on one of these drugs in addition to her Metformin and Glipizide.  We could try adding Tradjenta 5 mg daily or Januvia 50 mg daily.  These meds are expensive and if they can't be provided through the clinic, patient will likely not be able to afford them out of pocket.     Please consider the following in-hospital insulin adjustments to achieve tighter glucose control in hospital:  1. Increase Lantus slightly to 22 units daily  2. Start Novolog Meal Coverage: Novolog 4 units TID with meals (hold if pt eats <50% of meal)       --Will follow patient during hospitalization--  Wyn Quaker RN, MSN, CDE Diabetes Coordinator Inpatient Glycemic Control Team Team Pager: [phone] (8a-5p)

## 2017-08-10 NOTE — Progress Notes (Addendum)
Patient ID: [name], female   DOB: 05/08/1966, 51 y.o.   MRN: [id number]    PROGRESS NOTE  [name]  JJO:[id number] DOB: [birth year] DOA: 08/03/2017  PCP: Tresa Garter, MD   Brief Narrative:  Patient is 51 year old female with known sleep apnea, noncompliance with CPAP, non-insulin-dependent type 2 diabetes, hypertension, mediastinal adenopathy, SMAC, not on treatment, diastolic CHF with noncompliance with Lasix, presented to emergency department with worsening dyspnea, 3-4 pillow orthopnea and weight gain.  Assessment & Plan:  Acute on chronic  diastolic CHF exacerbation: - Echocardiogram in 2017 with preserved left ventricular EF - Stress test in August 2017 unremarkable - continue Lasix 120 mg IV BID, pt reports feeling better this AM - Weight trend since admission Filed Weights   08/08/17 0500 08/09/17 0421 08/10/17 0841  Weight: (!) 159.4 kg (351 lb 6.6 oz) (!) 158.6 kg (349 lb 10.4 oz) (!) 142.7 kg (314 lb 8 oz)  - follow up on cardiology team recommendations  - monitor daily weights, I/O  Anemia of chronic disease - No evidence of active bleeding but FOBT was positive  - Iron levels low, iron supplements already started  - Hg overall stable, pt prefers to wait for now on any endoscopic studies until her breathing is better and I agree with that  - can likely have an outpatient work up unless active bleeding or worsening Hg while inpatient   Non-insulin-dependent DM type II - Last A1c in June 10.2 - Repeat A1c 9.7 - Continue Lantus 20 U QD - Change sliding scale to resistant  Hypomagnesemia - still low, cont to supplement and repeat Mg level in AM  Hypokalemia - From Lasix, on low end of normal, will supplement and repeat BMP in AM  HTN - Reasonable inpatient control - continue Cozaar, Coreg, Lasix   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  DVT prophylaxis: Lovenox SQ Code Status: Full  Family Communication: Patient at bedside Disposition Plan: To be determined, once cardiology team clears   Consultants:   PCCM   Cardiology   Procedures:   None  Antimicrobials:   None  Subjective: Pt reports feeling better, able to lay down flat.   Objective: Vitals:   08/09/17 2148 08/10/17 0604 08/10/17 0841 08/10/17 1234  BP: (!) 146/83 96/75  116/72  Pulse: 67 73  65  Resp: _0 Temp: 98.8 F (37.1 C) 98.2 F (36.8 C)  97.6 F (36.4 C)  TempSrc: Oral Oral  Oral  SpO2: 95% 98%  96%  Weight:   (!) 142.7 kg (314 lb 8 oz)   Height:        Intake/Output Summary (Last 24 hours) at 08/10/2017 1333 Last data filed at 08/10/2017 1100 Gross per 24 hour  Intake 304 ml  Output 3700 ml  Net -3396 ml   Filed Weights   08/08/17 0500 08/09/17 0421 08/10/17 0841  Weight: (!) 159.4 kg (351 lb 6.6 oz) (!) 158.6 kg (349 lb 10.4 oz) (!) 142.7 kg (314 lb 8 oz)   Physical Exam  Constitutional: Appears calm, NAD CVS: RRR, S1/S2 +, no gallops, no carotid bruit.  Pulmonary: Effort and breath sounds normal, mild crackles at bases  Abdominal: Soft. BS +,  no distension, tenderness, rebound or guarding.  Musculoskeletal: Normal range of  motion. +1 bilateral LE edema  Neuro: Alert. Normal reflexes, muscle tone coordination. No cranial nerve deficit.  Data Reviewed: I have personally reviewed following labs and imaging studies  CBC: Recent Labs  Lab 08/06/17 0451 08/07/17 0459 08/08/17 0551 08/09/17 0422 08/10/17 0433  WBC 7.2 7.4 6.4 7.2 6.5  HGB 7.7* 7.9* 8.0* 8.0* 8.9*  HCT 26.9* 27.9* 27.5* 28.2* 30.7*  MCV 71.9* 72.8* 71.8* 74.4* 74.9*  PLT 246 249 191 220 937   Basic Metabolic Panel: Recent Labs  Lab 08/05/17 0418 08/06/17 0451 08/07/17 0459 08/08/17 0551 08/09/17 0422 08/10/17 0433  NA  137 137 137 137 139 137  K 3.8 3.7 3.8 3.9 3.3* 3.6  CL 101 101 99* 100* 97* 97*  CO2 _0 33* 32  GLUCOSE 221* 274* 204* 218* 176* 225*  BUN 25* 27* 27* 23* 22* 21*  CREATININE 0.77 0.83 0.76 0.70 0.81 0.74  CALCIUM 8.9 8.9 8.7* 8.7* 8.9 8.8*  MG 1.6* 1.7 1.7 1.8  --  1.6*   CBG: Recent Labs  Lab 08/09/17 1201 08/09/17 1717 08/09/17 2138 08/10/17 0741 08/10/17 1141  GLUCAP 223* 239* 264* 185* 233*   Urine analysis:    Component Value Date/Time   COLORURINE YELLOW 08/06/2016 1245   APPEARANCEUR TURBID (A) 08/06/2016 1245   LABSPEC 1.028 08/06/2016 1245   PHURINE 5.5 08/06/2016 1245   GLUCOSEU 2+ (A) 08/06/2016 1245   HGBUR NEGATIVE 08/06/2016 1245   BILIRUBINUR NEGATIVE 08/06/2016 1245   BILIRUBINUR neg 09/10/2014 [place] 08/06/2016 1245   PROTEINUR 2+ (A) 08/06/2016 1245   UROBILINOGEN 1.0 09/10/2014 1219   UROBILINOGEN 0.2 12/07/2013 0041   NITRITE NEGATIVE 08/06/2016 1245   LEUKOCYTESUR NEGATIVE 08/06/2016 1245    Radiology Studies: No results found.  Scheduled Meds: . aspirin EC  81 mg Oral Daily  . carvedilol  25 mg Oral BID  . enoxaparin (LOVENOX) injection  80 mg Subcutaneous Q24H  . ferrous sulfate  325 mg Oral TID WC  . insulin aspart  0-20 Units Subcutaneous TID WC  . insulin aspart  0-5 Units Subcutaneous QHS  . insulin glargine  20 Units Subcutaneous Daily  . losartan  100 mg Oral Daily  . oxybutynin  15 mg Oral QHS  . risperiDONE  4 mg Oral Daily  . senna-docusate  1 tablet Oral BID   Continuous Infusions: . furosemide Stopped (08/10/17 1019)  . magnesium sulfate 1 - 4 g bolus IVPB Stopped (08/10/17 1059)  . magnesium sulfate 1 - 4 g bolus IVPB Stopped (08/10/17 1215)     LOS: 6 days   Time spent: 25 minutes   Faye Ramsay, MD [facility] Pager [phone]  If 7PM-7AM, please contact night-coverage [email or web address] Password [email or web address] 08/10/2017, 1:33 PM

## 2017-08-10 NOTE — Progress Notes (Signed)
Progress Note  Patient Name: [name] Date of Encounter: 08/10/2017  Primary Cardiologist: Dr. [name]  Subjective   Breathing improved, able to lay flat in bed. No chest pain or palpitations. Still with lower extremity edema.   Inpatient Medications    Scheduled Meds: . aspirin EC  81 mg Oral Daily  . carvedilol  25 mg Oral BID  . enoxaparin (LOVENOX) injection  80 mg Subcutaneous Q24H  . ferrous sulfate  325 mg Oral TID WC  . insulin aspart  0-20 Units Subcutaneous TID WC  . insulin aspart  0-5 Units Subcutaneous QHS  . insulin glargine  20 Units Subcutaneous Daily  . losartan  100 mg Oral Daily  . oxybutynin  15 mg Oral QHS  . risperiDONE  4 mg Oral Daily  . senna-docusate  1 tablet Oral BID   Continuous Infusions: . furosemide 120 mg (08/10/17 0840)  . magnesium sulfate 1 - 4 g bolus IVPB     PRN Meds: nitroGLYCERIN, oxyCODONE   Vital Signs    Vitals:   08/09/17 1330 08/09/17 2148 08/10/17 0604 08/10/17 0841  BP: 120/75 (!) 146/83 96/75   Pulse: 73 67 73   Resp: 20 18 20    Temp: 98.5 F (36.9 C) 98.8 F (37.1 C) 98.2 F (36.8 C)   TempSrc: Oral Oral Oral   SpO2: 99% 95% 98%   Weight:    (!) 314 lb 8 oz (142.7 kg)  Height:        Intake/Output Summary (Last 24 hours) at 08/10/2017 0957 Last data filed at 08/10/2017 0600 Gross per 24 hour  Intake 770 ml  Output 3000 ml  Net -2230 ml   Filed Weights   08/08/17 0500 08/09/17 0421 08/10/17 0841  Weight: (!) 351 lb 6.6 oz (159.4 kg) (!) 349 lb 10.4 oz (158.6 kg) (!) 314 lb 8 oz (142.7 kg)    Telemetry    NSR, HR in 70's - 80's. Occasional PVC's.  - Personally Reviewed  ECG   No new tracings.   Physical Exam   General: Well developed obese female appearing in no acute distress. Head: Normocephalic, atraumatic.  Neck: Supple without bruits, JVD difficult to assess secondary to body habitus. Lungs:  Resp regular and unlabored, decreased breath sounds along bases bilaterally. Heart: RRR,  S1, S2, no S3, S4, or murmur; no rub. Abdomen: Soft, non-tender, non-distended with normoactive bowel sounds. No hepatomegaly. No rebound/guarding. No obvious abdominal masses. Extremities: No clubbing or cyanosis, trace lower extremity edema. Distal pedal pulses are 2+ bilaterally. Neuro: Alert and oriented X 3. Moves all extremities spontaneously. Psych: Normal affect.  Labs    Chemistry Recent Labs  Lab 08/08/17 0551 08/09/17 0422 08/10/17 0433  NA 137 139 137  K 3.9 3.3* 3.6  CL 100* 97* 97*  CO2 29 33* 32  GLUCOSE 218* 176* 225*  BUN 23* 22* 21*  CREATININE 0.70 0.81 0.74  CALCIUM 8.7* 8.9 8.8*  GFRNONAA >60 >60 >60  GFRAA >60 >60 >60  ANIONGAP 8 9 8      Hematology Recent Labs  Lab 08/08/17 0551 08/09/17 0422 08/10/17 0433  WBC 6.4 7.2 6.5  RBC 3.83* 3.79* 4.10  HGB 8.0* 8.0* 8.9*  HCT 27.5* 28.2* 30.7*  MCV 71.8* 74.4* 74.9*  MCH 20.9* 21.1* 21.7*  MCHC 29.1* 28.4* 29.0*  RDW 18.9* 19.9* 20.6*  PLT 191 220 219    Cardiac EnzymesNo results for input(s): TROPONINI in the last 168 hours. No results for input(s): TROPIPOC in the  last 168 hours.   BNPNo results for input(s): BNP, PROBNP in the last 168 hours.   DDimer No results for input(s): DDIMER in the last 168 hours.   Radiology    No results found.  Cardiac Studies   Echocardiogram: 08/06/2017 Study Conclusions  - Left ventricle: The cavity size was normal. Wall thickness was   increased in a pattern of moderate LVH. Systolic function was   normal. The estimated ejection fraction was in the range of 55%   to 60%. Wall motion was normal; there were no regional wall   motion abnormalities. Features are consistent with a pseudonormal   left ventricular filling pattern, with concomitant abnormal   relaxation and increased filling pressure (grade 2 diastolic   dysfunction). - Mitral valve: Moderately to severely calcified annulus. Valve   area by continuity equation (using LVOT flow): 1.8  cm^2. - Left atrium: The atrium was moderately dilated. - Right atrium: The atrium was mildly to moderately dilated. - Tricuspid valve: There was mild-moderate regurgitation. - Pulmonary arteries: Systolic pressure was moderately increased.   PA peak pressure: 58 mm Hg (S).   Patient Profile     51 y.o. female with PMH of hypertensive heart disease, chronic diastolic HF, HTN, Type 2 DM, obesityand OSA (on CPAP) who presented to [facility] ED on 08/02/2017 for evaluation of worsening dyspnea, admitted for CHF exacerbation.     Assessment & Plan    1. Acute on Chronic Diastolic CHF - presented with worsening dyspnea at rest and on exertion in the setting of medication non-compliance with home diuretics. BNP at 250 on admission with CXR showing interstitial edema.  - she was initially placed on IV Lasix 80mg  BID with intermittent doses of Metolazone. IV Lasix was increased to 120mg  BID on 08/09/2017 with a net output of -12.6L thus far. Weight was 347 lbs this admission and recorded as 349 lbs on 11/12. At 314 this AM (?). Will ask the patient's nurse to obtain a repeat weight. Kidney function remains stable at 0.74. Continue to monitor I&O's along with daily weights.  2. Atypical Chest Pain - reported chest pain at that time of admission. Troponin value was negative and EKG showed no acute ischemic changes.  - NST in 04/2016 showed no evidence of ischemia and was low-risk. - no plans for further ischemic evaluation at this time.   3. HTN - BP has been stable at 96/75 - 146/83 within the past 24 hours.  - continue current medication regimen.   4. Microcytic anemia - baseline 10.0 - 11.0, at 8.1 on admission. Occult blood positive.  - she denies any evidence of active bleeding. Has been started on iron supplementation.  - per admitting team.   5. Morbid Obesity - BMI at 54. Diet and exercise strongly encouraged.  - she is interested in surgical options. Plans to establish care with a  Weight Management clinic at the time of hospital discharge.   6. Hypomagnesemia - Mg at 1.6. Replacement has been ordered by the admitting team.    For questions or updates, please contact [place] Please consult [email or web address] for contact info under Cardiology/STEMI.   Signed, Erma Heritage , PA-C 9:57 AM 08/10/2017 Pager: [phone]

## 2017-08-10 NOTE — Progress Notes (Signed)
  RD consulted for nutrition education regarding diabetes.   Lab Results  Component Value Date   HGBA1C 9.7 (H) 08/06/2017    RD provided "Carbohydrate Counting for People with Diabetes" handout from the Academy of [employer] and Dietetics. Discussed different food groups and their effects on blood sugar, emphasizing carbohydrate-containing foods. Provided list of carbohydrates and recommended serving sizes of common foods.  Discussed importance of controlled and consistent carbohydrate intake throughout the day. Provided examples of ways to balance meals/snacks and encouraged intake of high-fiber, whole grain complex carbohydrates. Teach back method used.  Pt admits to drinking soda twice per week. Discussed how sugar affects blood sugar and different options she could try. Pt amendable to decreasing soda consumption.   Expect fair compliance.  Body mass index is 49.26 kg/m. Pt meets criteria for morbid obesity based on current BMI.  Current diet order is heart healthy/carb modified, patient is consuming approximately 100% of meals at this time. Labs and medications reviewed. No further nutrition interventions warranted at this time. RD contact information provided. If additional nutrition issues arise, please re-consult RD.  Mariana Single RD, LDN Clinical Nutrition Pager # [phone]

## 2017-08-10 NOTE — Progress Notes (Signed)
Rt was checking the Pt's O2 sat and her SAT was 86% on RA. RT asked the Pt if she would put her CPAP on and she said no. RT explained to the Pt that with her OSA that she really needed the CPAP and she still refused. RT placed the Pt on 2L [place]. RT thinks the pt should wear 2 L [place] while asleep. RT will continue to monitor

## 2017-08-10 NOTE — Progress Notes (Signed)
Pt had 13 beat run of vtach, MD notified, vital signs stable. No new orders at this time.

## 2017-08-11 DIAGNOSIS — E1165 Type 2 diabetes mellitus with hyperglycemia: Secondary | ICD-10-CM

## 2017-08-11 DIAGNOSIS — F329 Major depressive disorder, single episode, unspecified: Secondary | ICD-10-CM

## 2017-08-11 LAB — CBC
HCT: 31.2 % — ABNORMAL LOW (ref 36.0–46.0)
Hemoglobin: 9 g/dL — ABNORMAL LOW (ref 12.0–15.0)
MCH: 21.7 pg — AB (ref 26.0–34.0)
MCHC: 28.8 g/dL — ABNORMAL LOW (ref 30.0–36.0)
MCV: 75.2 fL — ABNORMAL LOW (ref 78.0–100.0)
PLATELETS: 235 10*3/uL (ref 150–400)
RBC: 4.15 MIL/uL (ref 3.87–5.11)
RDW: 21.7 % — ABNORMAL HIGH (ref 11.5–15.5)
WBC: 6.3 10*3/uL (ref 4.0–10.5)

## 2017-08-11 LAB — BASIC METABOLIC PANEL
ANION GAP: 8 (ref 5–15)
BUN: 25 mg/dL — ABNORMAL HIGH (ref 6–20)
CALCIUM: 9 mg/dL (ref 8.9–10.3)
CO2: 34 mmol/L — ABNORMAL HIGH (ref 22–32)
Chloride: 98 mmol/L — ABNORMAL LOW (ref 101–111)
Creatinine, Ser: 0.79 mg/dL (ref 0.44–1.00)
GLUCOSE: 176 mg/dL — AB (ref 65–99)
Potassium: 3.4 mmol/L — ABNORMAL LOW (ref 3.5–5.1)
Sodium: 140 mmol/L (ref 135–145)

## 2017-08-11 LAB — GLUCOSE, CAPILLARY
GLUCOSE-CAPILLARY: 251 mg/dL — AB (ref 65–99)
GLUCOSE-CAPILLARY: 339 mg/dL — AB (ref 65–99)
Glucose-Capillary: 181 mg/dL — ABNORMAL HIGH (ref 65–99)
Glucose-Capillary: 226 mg/dL — ABNORMAL HIGH (ref 65–99)

## 2017-08-11 LAB — MAGNESIUM: Magnesium: 2 mg/dL (ref 1.7–2.4)

## 2017-08-11 MED ORDER — INSULIN GLARGINE 100 UNIT/ML ~~LOC~~ SOLN
25.0000 [IU] | Freq: Every day | SUBCUTANEOUS | Status: DC
  Administered 2017-08-12 – 2017-08-13 (×2): 25 [IU] via SUBCUTANEOUS
  Filled 2017-08-11 (×2): qty 0.25

## 2017-08-11 MED ORDER — POTASSIUM CHLORIDE CRYS ER 20 MEQ PO TBCR
30.0000 meq | EXTENDED_RELEASE_TABLET | Freq: Every day | ORAL | Status: DC
  Administered 2017-08-11 – 2017-08-13 (×3): 30 meq via ORAL
  Filled 2017-08-11 (×3): qty 1

## 2017-08-11 NOTE — Progress Notes (Signed)
Inpatient Diabetes Program Recommendations  AACE/ADA: New Consensus Statement on Inpatient Glycemic Control (2015)  Target Ranges:  Prepandial:   less than 140 mg/dL      Peak postprandial:   less than 180 mg/dL (1-2 hours)      Critically ill patients:  140 - 180 mg/dL   Results for [name], [name] (MRN [id number]) as of 08/11/2017 09:27  Ref. Range 08/10/2017 07:41 08/10/2017 11:41 08/10/2017 16:55 08/10/2017 20:58  Glucose-Capillary Latest Ref Range: 65 - 99 mg/dL 185 (H) 233 (H) 221 (H) 276 (H)    Home DM Meds:Metformin 1000 mg BID Glipizide 10 mg daily  Current Insulin Orders:Lantus 20 units daily Novolog Resistant Correction Scale/ SSI (0-20 units) TID AC      Met with patient on 11/12. Patient told me she recently stopped taking Farxiga (dapagliflozin) b/c it made her feel "crazy". Has taken insulin in the past, but does not want to restart insulin at home b/c she stated the insulin "put her in a coma". I asked pt to explain what she meant by "coma" but all she would describe to me was that the insulin gave her Hypoglycemia. Patient very resistant to starting insulin at home again.  Discussed with patient on 11/12 that she needs to get her wight and diet under control. Pt admits to drinking fruit juice and other beverages with sugar. Likely eating foods high in carbohydrates and also likely eating too many sweets.  Encouraged pt on 11/12 to check her CBGs at least BID at home and to record for her PCP at the [facility] clinic at [facility]. Reminded pt that her goal A1c is 7% or less to prevent both acute and chronic complications. Also reviewed CBG goals for home.     MD- Patient very resistant to restarting insulin for home. Not sure she will be willing to take insulin.  Have asked the RD to provide basic DM diet education with patient. Not sure patient will comply with dietary instructions.  If  the [facility] and [place] has any DPP-4 inhibitor medications in stock, we could start patient on one of these drugs in addition to her Metformin and Glipizide. We could try adding Tradjenta 5 mg daily or Januvia 50 mg daily. These meds are expensive and if they can't be provided through the clinic, patient will likely not be able to afford them out of pocket.    Please consider the following in-hospital insulin adjustments to achieve tighter glucose control in hospital:  1. Increase Lantus slightly to 22 units daily  2. Start Novolog Meal Coverage: Novolog 4 units TID with meals (hold if pt eats <50% of meal)       --Will follow patient during hospitalization--  Wyn Quaker RN, MSN, CDE Diabetes Coordinator Inpatient Glycemic Control Team Team Pager: [phone] (8a-5p)

## 2017-08-11 NOTE — Progress Notes (Signed)
Progress Note  Patient Name: [name] Date of Encounter: 08/11/2017  Primary Cardiologist: Dr. [name]   Subjective   No complaints today. She is resting comfortably. No resting dyspnea.   Inpatient Medications    Scheduled Meds: . aspirin EC  81 mg Oral Daily  . carvedilol  25 mg Oral BID  . enoxaparin (LOVENOX) injection  80 mg Subcutaneous Q24H  . ferrous sulfate  325 mg Oral TID WC  . insulin aspart  0-20 Units Subcutaneous TID WC  . insulin aspart  0-5 Units Subcutaneous QHS  . insulin glargine  20 Units Subcutaneous Daily  . losartan  100 mg Oral Daily  . oxybutynin  15 mg Oral QHS  . risperiDONE  4 mg Oral Daily  . senna-docusate  1 tablet Oral BID   Continuous Infusions: . furosemide Stopped (08/10/17 2149)  . magnesium sulfate 1 - 4 g bolus IVPB Stopped (08/10/17 1059)   PRN Meds: nitroGLYCERIN, oxyCODONE   Vital Signs    Vitals:   08/10/17 1645 08/10/17 2058 08/11/17 0527 08/11/17 0532  BP:  124/71 107/61   Pulse:  67 61   Resp:  20 20   Temp:  98.2 F (36.8 C) 98 F (36.7 C)   TempSrc:  Oral Oral   SpO2:  100% 94%   Weight: (!) 337 lb 12.8 oz (153.2 kg)   (!) 331 lb 14.4 oz (150.5 kg)  Height:        Intake/Output Summary (Last 24 hours) at 08/11/2017 0745 Last data filed at 08/11/2017 0600 Gross per 24 hour  Intake 894 ml  Output 3100 ml  Net -2206 ml   Filed Weights   08/09/17 0421 08/10/17 1645 08/11/17 0532  Weight: (!) 349 lb 10.4 oz (158.6 kg) (!) 337 lb 12.8 oz (153.2 kg) (!) 331 lb 14.4 oz (150.5 kg)    Telemetry    NSR - Personally Reviewed  ECG    NSR - Personally Reviewed  Physical Exam   GEN: No acute distress.  Morbidly obese  Neck: No JVD Cardiac: RRR, no murmurs, rubs, or gallops.  Respiratory: Clear to auscultation bilaterally. GI: Soft, nontender, non-distended  MS: No edema; No deformity. Neuro:  Nonfocal  Psych: Normal affect   Labs    Chemistry Recent Labs  Lab 08/09/17 0422 08/10/17 0433  08/11/17 0425  NA 139 137 140  K 3.3* 3.6 3.4*  CL 97* 97* 98*  CO2 33* 32 34*  GLUCOSE 176* 225* 176*  BUN 22* 21* 25*  CREATININE 0.81 0.74 0.79  CALCIUM 8.9 8.8* 9.0  GFRNONAA >60 >60 >60  GFRAA >60 >60 >60  ANIONGAP 9 8 8      Hematology Recent Labs  Lab 08/09/17 0422 08/10/17 0433 08/11/17 0425  WBC 7.2 6.5 6.3  RBC 3.79* 4.10 4.15  HGB 8.0* 8.9* 9.0*  HCT 28.2* 30.7* 31.2*  MCV 74.4* 74.9* 75.2*  MCH 21.1* 21.7* 21.7*  MCHC 28.4* 29.0* 28.8*  RDW 19.9* 20.6* 21.7*  PLT 220 219 235    Cardiac EnzymesNo results for input(s): TROPONINI in the last 168 hours. No results for input(s): TROPIPOC in the last 168 hours.   BNPNo results for input(s): BNP, PROBNP in the last 168 hours.   DDimer No results for input(s): DDIMER in the last 168 hours.   Radiology    No results found.  Cardiac Studies   Echocardiogram: 08/06/2017 Study Conclusions  - Left ventricle: The cavity size was normal. Wall thickness was increased in a pattern  of moderate LVH. Systolic function was normal. The estimated ejection fraction was in the range of 55% to 60%. Wall motion was normal; there were no regional wall motion abnormalities. Features are consistent with a pseudonormal left ventricular filling pattern, with concomitant abnormal relaxation and increased filling pressure (grade 2 diastolic dysfunction). - Mitral valve: Moderately to severely calcified annulus. Valve area by continuity equation (using LVOT flow): 1.8 cm^2. - Left atrium: The atrium was moderately dilated. - Right atrium: The atrium was mildly to moderately dilated. - Tricuspid valve: There was mild-moderate regurgitation. - Pulmonary arteries: Systolic pressure was moderately increased. PA peak pressure: 58 mm Hg (S).   Patient Profile   51 y.o. female withPMHof hypertensive heart disease, chronic diastolic HF, HTN, Type 2DM, obesityand OSA(on CPAP) who presented to [facility] ED on  08/02/2017 for evaluation of worsening dyspnea, admitted for CHF exacerbation.  Assessment & Plan    1. Acute on Chronic Diastolic CHF: acute exacerbation in the setting of medication noncompliance with home diuretics. Pt admitted ~30 lb above dry weight. Pt notes normal weight ~320 lb. She is diuresing well with high dose IV Lasix, 120 mg BID. She put out an additional 3L yesterday. Net I/Os negative 14.8 L since admit. Weight today is at 331 lb. Renal function is stable. Scr at 0.79. She is hypokalemic at 3.4. Replete w/ supplemental K. Continue to diurese. Continue strict I/Os and daily weights and low sodium diet. BP is well controlled and she is on a BB. HR in the 60s.   2. HTN: well controlled and stable. Monitor closely as we continue to diurese.   3. Atypical Chest Pain: pt w/ h/o chronic chest pain and has been followed by Dr. [name]. Cardiac enzymes have been negative this admit. Previous NST was negative. No plans for further ischemic eval this admission.   4. Microcytic Anemia: Hgb improved from 8-9. Baseline is ~10-11. FOBT is negative. She denies any other active bleeding. Continue Fe supplementation. Further management per IM.   5. Morbid Obesity: Body mass index is 51.98 kg/m. Weight loss advised. Plans to enroll in a weight management clinic.   6. Hypokalemia: in the setting of aggressive diuresis. K 3.4 today. Replete w/ supplemental K. F/u BMP in the AM. Mg level is WNL today at 2.0.   7. DM: Poorly controlled. Hgb A1c on 08/06/17 was 9.7. She is on insulin. Management per IM.   For questions or updates, please contact [place] Please consult [email or web address] for contact info under Cardiology/STEMI.      Signed, Lyda Jester, PA-C  08/11/2017, 7:45 AM

## 2017-08-11 NOTE — Progress Notes (Signed)
Patient ID: [name], female   DOB: 1966-09-06, 51 y.o.   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  Assessment & Plan:  Acute on chronic  diastolic CHF exacerbation: -Repeat echocardiogram demonstrating grade 2 diastolic dysfunction with preserved ejection fraction and no wall motion abnormalities. -Patient reporting improvement in her overall shortness of breath and orthopnea -Still with signs of fluid overload (even significantly improved). -Approximately 14 L negative -Creatinine level has remained stable -Following cardiology recommendations we will continue diuresis on the creatinine rise, in order to establish a new dry weight. -She is going to require adjustment of her diuretics therapy as an outpatient Filed Weights   08/09/17 0421 08/10/17 1645 08/11/17 0532  Weight: (!) 158.6 kg (349 lb 10.4 oz) (!) 153.2 kg (337 lb 12.8 oz) (!) 150.5 kg (331 lb 14.4 oz)  -Continue daily weights, low-sodium diet, Strict intake and output.  Anemia of chronic disease -No evidence of active bleeding but FOBT was positive  -Iron levels found to be low and the patient has been started on iron supplementation.  -Hg overall stable, pt prefers to wait for now on any endoscopic studies until her breathing is better and I agree with that. -can likely have an outpatient work up unless active bleeding or worsening Hg while inpatient is appreciated.  Non-insulin-dependent DM type II -Uncontrolled  -Last A1c 9.7  -Patient declining the use of insulin at discharge -Normally on  metformin and glipizide (dose max out).   -While inpatient we will continue holding oral hypoglycemic agents and will use a sliding scale insulin and Lantus; Lantus 1 with a dose adjustment to 25 units for better control of her CBGs.   -Continue modified carbohydrate diet.   Hypomagnesemia -Repleted -Most likely associated with active diuresis -Will continue monitoring him for the repletion as needed  Hypokalemia -Associated with ongoing diuresis -Will initiate daily maintenance potassium supplementation -Follow electrolytes closely.    HTN -Reasonable inpatient control -Will continue Cozaar, Coreg, Lasix.   -Heart healthy diet has been ordered  Mediastinal adenopathy/Mycobacterium avium complex (HCC) - Not currently treated, ace level Wnl, negative quantiferon test, negative HIV, followed by Dr Lamonte Sakai -Patient to have follow-up as an outpatient. -Remains stable and afebrile.    OSA -continue CPAP QHS  Depression/anxiety: -Mood overall stable -No suicidal ideation or hallucinations -Continue Risperdal  Morbid obesity - Body mass index is 51.98 kg/m.  -Low calorie diet, increase exercise has been discussed with patient  DVT prophylaxis: Lovenox SQ Code Status: Full  Family Communication: No family at bedside Disposition Plan: Continue IV Lasix, follow cardiology recommendations for transition to oral diuretics and dosage.    Consultants:   PCCM   Cardiology   Procedures:   2D echo: 08/06/2017 - Left ventricle: The cavity size was normal. Wall thickness was increased in a pattern of moderate LVH. Systolic function was normal. The estimated ejection fraction was in the range of 55% to 60%. Wall motion was normal; there were no regional wall motion abnormalities. Features are consistent with a pseudonormal left ventricular filling pattern, with concomitant abnormal relaxation and increased filling pressure (grade 2 diastolic dysfunction). - Mitral  valve: Moderately  to severely calcified annulus. Valve area by continuity equation (using LVOT flow): 1.8 cm^2. - Left atrium: The atrium was moderately dilated. - Right atrium: The atrium was mildly to moderately dilated. - Tricuspid valve: There was mild-moderate regurgitation. - Pulmonary arteries: Systolic pressure was moderately increased. PA peak pressure: 58 mm Hg (S).   Antimicrobials:   None  Subjective: Afebrile, denies chest pain, no nausea, no vomiting.  Patient reports improvement in her shortness of breath and orthopnea.  Also appreciated improvement in her overall swelling.  Objective: Vitals:   08/11/17 0527 08/11/17 0532 08/11/17 1256 08/11/17 1416  BP: 107/61  (!) 95/56 (!) 145/81  Pulse: 61  69   Resp: 20     Temp: 98 F (36.7 C)  (!) 97 F (36.1 C)   TempSrc: Oral  Oral   SpO2: 94%  100%   Weight:  (!) 150.5 kg (331 lb 14.4 oz)    Height:        Intake/Output Summary (Last 24 hours) at 08/11/2017 1832 Last data filed at 08/11/2017 1215 Gross per 24 hour  Intake 1192 ml  Output 2200 ml  Net -1008 ml   Filed Weights   08/09/17 0421 08/10/17 1645 08/11/17 0532  Weight: (!) 158.6 kg (349 lb 10.4 oz) (!) 153.2 kg (337 lb 12.8 oz) (!) 150.5 kg (331 lb 14.4 oz)   Physical Exam  Constitutional: afebrile, in no distress, denies CP and reported improvement in SOB/orthopnea.  CVS: RRR, no rubs, no gallops, unable to properly assess JVD with body habitus.   Pulmonary: normal resp effort, no wheezing, fine crackles at bases. No using oxygen supplementation. Abdominal: obese, soft, NT, ND, positive BS.  Musculoskeletal: 1+ bilateral edema, no cyanosis, no clubbing, patient denies pain.  Normal range of motion appreciated. Neuro: Alert, awake and oriented x3, no focal neurologic or motor deficit, cranial nerves II through XII intact.    Data Reviewed: I have personally reviewed following labs and imaging studies  CBC: Recent Labs  Lab 08/07/17 0459  08/08/17 0551 08/09/17 0422 08/10/17 0433 08/11/17 0425  WBC 7.4 6.4 7.2 6.5 6.3  HGB 7.9* 8.0* 8.0* 8.9* 9.0*  HCT 27.9* 27.5* 28.2* 30.7* 31.2*  MCV 72.8* 71.8* 74.4* 74.9* 75.2*  PLT 249 191 220 219 361   Basic Metabolic Panel: Recent Labs  Lab 08/06/17 0451 08/07/17 0459 08/08/17 0551 08/09/17 0422 08/10/17 0433 08/11/17 0425  NA 137 137 137 139 137 140  K 3.7 3.8 3.9 3.3* 3.6 3.4*  CL 101 99* 100* 97* 97* 98*  CO2 _0 33* 32 34*  GLUCOSE 274* 204* 218* 176* 225* 176*  BUN 27* 27* 23* 22* 21* 25*  CREATININE 0.83 0.76 0.70 0.81 0.74 0.79  CALCIUM 8.9 8.7* 8.7* 8.9 8.8* 9.0  MG 1.7 1.7 1.8  --  1.6* 2.0   CBG: Recent Labs  Lab 08/10/17 1655 08/10/17 2058 08/11/17 0745 08/11/17 1159 08/11/17 1703  GLUCAP 221* 276* 251* 181* 226*   Urine analysis:    Component Value Date/Time   COLORURINE YELLOW 08/06/2016 1245   APPEARANCEUR TURBID (A) 08/06/2016 1245   LABSPEC 1.028 08/06/2016 1245   PHURINE 5.5 08/06/2016 1245   GLUCOSEU 2+ (A) 08/06/2016 1245   HGBUR NEGATIVE 08/06/2016 1245   BILIRUBINUR NEGATIVE 08/06/2016 1245   BILIRUBINUR neg 09/10/2014 [place] 08/06/2016 1245   PROTEINUR 2+ (A) 08/06/2016 1245   UROBILINOGEN 1.0 09/10/2014 1219   UROBILINOGEN 0.2 12/07/2013 0041   NITRITE NEGATIVE 08/06/2016 1245  LEUKOCYTESUR NEGATIVE 08/06/2016 1245    Radiology Studies: No results found.  Scheduled Meds: . aspirin EC  81 mg Oral Daily  . carvedilol  25 mg Oral BID  . enoxaparin (LOVENOX) injection  80 mg Subcutaneous Q24H  . ferrous sulfate  325 mg Oral TID WC  . insulin aspart  0-20 Units Subcutaneous TID WC  . insulin aspart  0-5 Units Subcutaneous QHS  . [START ON 08/12/2017] insulin glargine  25 Units Subcutaneous Daily  . losartan  100 mg Oral Daily  . oxybutynin  15 mg Oral QHS  . potassium chloride  30 mEq Oral Daily  . risperiDONE  4 mg Oral Daily  . senna-docusate  1 tablet Oral BID   Continuous Infusions: .  furosemide Stopped (08/11/17 1039)  . magnesium sulfate 1 - 4 g bolus IVPB Stopped (08/10/17 1059)     LOS: 7 days   Time spent: 30 minutes   Barton Dubois, MD [facility] Pager [phone]  If 7PM-7AM, please contact night-coverage [email or web address] Password [facility] 08/11/2017, 6:32 PM

## 2017-08-11 NOTE — Progress Notes (Signed)
Pt unable to tolerate CPAP.  Pt has attempted to wear but can not tolerate longer than a couple mins.  CPAP machine remains at bedside.  RT to monitor and assess as needed.

## 2017-08-12 LAB — BASIC METABOLIC PANEL
Anion gap: 9 (ref 5–15)
BUN: 26 mg/dL — ABNORMAL HIGH (ref 6–20)
CALCIUM: 9 mg/dL (ref 8.9–10.3)
CO2: 32 mmol/L (ref 22–32)
CREATININE: 0.88 mg/dL (ref 0.44–1.00)
Chloride: 97 mmol/L — ABNORMAL LOW (ref 101–111)
GFR calc non Af Amer: >60 mL/min (ref >60)
Glucose, Bld: 210 mg/dL — ABNORMAL HIGH (ref 65–99)
Potassium: 3.6 mmol/L (ref 3.5–5.1)
Sodium: 138 mmol/L (ref 135–145)

## 2017-08-12 LAB — GLUCOSE, CAPILLARY
GLUCOSE-CAPILLARY: 204 mg/dL — AB (ref 65–99)
GLUCOSE-CAPILLARY: 275 mg/dL — AB (ref 65–99)
GLUCOSE-CAPILLARY: 225 mg/dL — AB (ref 65–99)
Glucose-Capillary: 204 mg/dL — ABNORMAL HIGH (ref 65–99)
Glucose-Capillary: 206 mg/dL — ABNORMAL HIGH (ref 65–99)

## 2017-08-12 MED ORDER — BUMETANIDE 1 MG PO TABS: 4.0000 mg | ORAL_TABLET | Freq: Two times a day (BID) | ORAL | Status: DC

## 2017-08-12 MED ORDER — BUMETANIDE 1 MG PO TABS
6.0000 mg | ORAL_TABLET | Freq: Two times a day (BID) | ORAL | Status: DC
  Administered 2017-08-12 – 2017-08-13 (×3): 6 mg via ORAL
  Filled 2017-08-12 (×3): qty 6

## 2017-08-12 NOTE — Progress Notes (Signed)
RN came to room and Pt's mother brought Pt food from outside fried chicken legs and wings. Pt had finished lunch less than an hour prior. RN attempted to educate Pt and Mother regarding Heart Failure. Pt states "I was hungry and wanted food"

## 2017-08-12 NOTE — Progress Notes (Signed)
Progress Note  Patient Name: [name] Date of Encounter: 08/12/2017  Primary Cardiologist: Dr. [name]  Subjective   No complaints today. Denies dyspnea.   Inpatient Medications    Scheduled Meds: . aspirin EC  81 mg Oral Daily  . carvedilol  25 mg Oral BID  . enoxaparin (LOVENOX) injection  80 mg Subcutaneous Q24H  . ferrous sulfate  325 mg Oral TID WC  . insulin aspart  0-20 Units Subcutaneous TID WC  . insulin aspart  0-5 Units Subcutaneous QHS  . insulin glargine  25 Units Subcutaneous Daily  . losartan  100 mg Oral Daily  . oxybutynin  15 mg Oral QHS  . potassium chloride  30 mEq Oral Daily  . risperiDONE  4 mg Oral Daily  . senna-docusate  1 tablet Oral BID   Continuous Infusions: . furosemide 120 mg (08/12/17 0732)  . magnesium sulfate 1 - 4 g bolus IVPB Stopped (08/10/17 1059)   PRN Meds: nitroGLYCERIN, oxyCODONE   Vital Signs    Vitals:   08/11/17 1256 08/11/17 1416 08/11/17 2016 08/12/17 0437  BP: (!) 95/56 (!) 145/81 121/64 133/60  Pulse: 69  72 77  Resp:   20 18  Temp: (!) 97 F (36.1 C)  98.2 F (36.8 C) 98.1 F (36.7 C)  TempSrc: Oral  Oral Oral  SpO2: 100%  98% 98%  Weight:    (!) 328 lb 9.6 oz (149.1 kg)  Height:        Intake/Output Summary (Last 24 hours) at 08/12/2017 0759 Last data filed at 08/12/2017 0600 Gross per 24 hour  Intake 952 ml  Output 1800 ml  Net -848 ml   Filed Weights   08/10/17 1645 08/11/17 0532 08/12/17 0437  Weight: (!) 337 lb 12.8 oz (153.2 kg) (!) 331 lb 14.4 oz (150.5 kg) (!) 328 lb 9.6 oz (149.1 kg)    Telemetry    NSR - Personally Reviewed  ECG    NSR - Personally Reviewed  Physical Exam   GEN: No acute distress. Morbidly obese  Neck: No JVD Cardiac: RRR, no murmurs, rubs, or gallops.  Respiratory: Clear to auscultation bilaterally. GI: Soft, nontender, non-distended  MS: No edema; No deformity. Neuro:  Nonfocal  Psych: Normal affect   Labs    Chemistry Recent Labs  Lab  08/10/17 0433 08/11/17 0425 08/12/17 0429  NA 137 140 138  K 3.6 3.4* 3.6  CL 97* 98* 97*  CO2 32 34* 32  GLUCOSE 225* 176* 210*  BUN 21* 25* 26*  CREATININE 0.74 0.79 0.88  CALCIUM 8.8* 9.0 9.0  GFRNONAA >60 >60 >60  GFRAA >60 >60 >60  ANIONGAP 8 8 9      Hematology Recent Labs  Lab 08/09/17 0422 08/10/17 0433 08/11/17 0425  WBC 7.2 6.5 6.3  RBC 3.79* 4.10 4.15  HGB 8.0* 8.9* 9.0*  HCT 28.2* 30.7* 31.2*  MCV 74.4* 74.9* 75.2*  MCH 21.1* 21.7* 21.7*  MCHC 28.4* 29.0* 28.8*  RDW 19.9* 20.6* 21.7*  PLT 220 219 235    Cardiac EnzymesNo results for input(s): TROPONINI in the last 168 hours. No results for input(s): TROPIPOC in the last 168 hours.   BNPNo results for input(s): BNP, PROBNP in the last 168 hours.   DDimer No results for input(s): DDIMER in the last 168 hours.   Radiology    No results found.  Cardiac Studies   Echocardiogram: 08/06/2017 Study Conclusions  - Left ventricle: The cavity size was normal. Wall thickness was increased  in a pattern of moderate LVH. Systolic function was normal. The estimated ejection fraction was in the range of 55% to 60%. Wall motion was normal; there were no regional wall motion abnormalities. Features are consistent with a pseudonormal left ventricular filling pattern, with concomitant abnormal relaxation and increased filling pressure (grade 2 diastolic dysfunction). - Mitral valve: Moderately to severely calcified annulus. Valve area by continuity equation (using LVOT flow): 1.8 cm^2. - Left atrium: The atrium was moderately dilated. - Right atrium: The atrium was mildly to moderately dilated. - Tricuspid valve: There was mild-moderate regurgitation. - Pulmonary arteries: Systolic pressure was moderately increased. PA peak pressure: 58 mm Hg (S).    Patient Profile     51 y.o.femalewithPMHof hypertensive heart disease, chronic diastolic HF, HTN, Type 2DM, obesityand OSA(on CPAP)  who presented to [facility] ED on 08/02/2017 for evaluation of worsening dyspnea, admitted for CHF exacerbation.  Assessment & Plan    1. Acute on Chronic Diastolic CHF: acute exacerbation in the setting of medication noncompliance at home w/ diuretics. Admitted w/ fluid overload. She is responding well to IV Lasix. Net I/Os negative 15.7L since admit. Weight continues to trend down, now at 328 lb. Pt notes previous dry weight was 320 lb. Renal function is WNL. Continue to diurese. Continue I/Os and daily weights.   2. HTN: controlled today on current regimen.   3. 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 Followed by Dr. [name].   4. Microcytic Anemia: on supplemental Fe. FOBT negative. Pt denies any other bleeding. Management per IM.   5. Morbid Obesity: Body mass index is 51.47 kg/m. Pt plans to enroll in weight loss/ weight management program after discharge.   6. Hypokalemia: resolved with supplementation. K 3.6 today. Continue to monitor w/ diuresis and supplement as needed.   7. DM: per IM.     For questions or updates, please contact [place] Please consult [email or web address] for contact info under Cardiology/STEMI.      Signed, Lyda Jester, PA-C  08/12/2017, 7:59 AM

## 2017-08-12 NOTE — Progress Notes (Signed)
Patient does not tolerate wearing nocturnal CPAP via nasal mask. She states home equipment was retrieved by home healthcare in the past due to the inability to tolerate it long enough. Equipment removed from room. Order changed to prn per RT protocol.

## 2017-08-12 NOTE — Progress Notes (Signed)
Patient found sleeping in chair on room air. Sats 89%. Placed patient on 2lpm nasal cannula. Patient's sats now 92%. Will continue to monitor.

## 2017-08-12 NOTE — Progress Notes (Signed)
Attempted PIV x3  By 2 IVT RN's.  Pt screaming prior to being stuck.  Able to get in the vein but unable to advance cath due to clamping down, pt tense.  Pt refused another attempt with [place] and stated to be d/c home tomorrow.  Requested po meds.  Anderson Malta RN notified

## 2017-08-12 NOTE — Progress Notes (Signed)
IV team unable to restart Peripheral IV MD updated via phone and new orders to be entered.

## 2017-08-12 NOTE — Progress Notes (Signed)
Patient ID: [name], female   DOB: 11-24-1965, 51 y.o.   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  Assessment & Plan:  Acute on chronic  diastolic CHF exacerbation: -Repeat echocardiogram demonstrating grade 2 diastolic dysfunction with preserved ejection fraction and no wall motion abnormalities. -Patient reporting significant improvement in her overall shortness of breath and orthopnea. Normal O2 sat on RA. -Still with mild signs of fluid overload (even significantly improved). -Approximately 16L negative -Creatinine level has remained stable -Following cardiology recommendations will continue IV diuresis until creatinine rises, in order to establish a new dry weight and then switch to oral regimen, probably Bumex. -She is going to require adjustment of her diuretics therapy as an outpatient [facility] Weights   08/10/17 1645 08/11/17 0532 08/12/17 0437  Weight: (!) 153.2 kg (337 lb 12.8 oz) (!) 150.5 kg (331 lb 14.4 oz) (!) 149.1 kg (328 lb 9.6 oz)  -Continue daily weights, low-sodium diet, Strict intake and output.  Anemia of chronic disease -No evidence of active bleeding but FOBT was positive  -Iron levels found to be low and the patient has been started on iron supplementation.  -Hg overall stable, pt prefers to wait for now on any endoscopic studies until her breathing is better and I agree with that. -can likely have an outpatient work up unless active bleeding or worsening Hgb while inpatient is appreciated.  Non-insulin-dependent DM type II -Uncontrolled    -Last A1c 9.7  -Patient declining the use of insulin at discharge -Normally on metformin and glipizide (dose max out).   -While inpatient we will continue holding oral hypoglycemic agents and will use a sliding scale insulin and Lantus; Lantus 1 with a dose adjustment to 25 units for better control of her CBGs.   -Continue modified carbohydrate diet.   Hypomagnesemia -Repleted -Most likely associated with active diuresis -Will continue monitoring and replete further as needed  Hypokalemia -Associated with ongoing diuresis -Will continue daily maintenance potassium supplementation -Follow electrolytes closely and replete further as needed.    HTN -Reasonable inpatient control -Will continue Cozaar, Coreg and Lasix for now.   -Heart healthy diet has been ordered  Mediastinal adenopathy/Mycobacterium avium complex (HCC) - Not currently treated, ace level Wnl, negative quantiferon test, negative HIV, followed by Dr Lamonte Sakai -Patient to have follow-up as an outpatient. -Remains stable and afebrile.    OSA -continue CPAP QHS -patient educated about need for compliance.  Depression/anxiety: -Mood overall stable -No suicidal ideation or hallucinations -Continue Risperdal  Morbid obesity - Body mass index is 51.47 kg/m.  -Low calorie diet, increase exercise has been discussed with patient  DVT prophylaxis: Lovenox SQ Code Status: Full  Family Communication: No family at bedside Disposition Plan: Continue IV Lasix, will follow cardiology recommendations for transition to oral diuretics and dosage; getting closer to that. Further education about diet and sodium intake provided.  Consultants:   PCCM   Cardiology   Procedures:   2D echo: 08/06/2017 - Left ventricle: The cavity size was normal. Wall thickness was increased in a pattern of moderate LVH. Systolic function was normal. The estimated ejection fraction was in the range of 55% to 60%.  Wall motion was  normal; there were no regional wall motion abnormalities. Features are consistent with a pseudonormal left ventricular filling pattern, with concomitant abnormal relaxation and increased filling pressure (grade 2 diastolic dysfunction). - Mitral valve: Moderately to severely calcified annulus. Valve area by continuity equation (using LVOT flow): 1.8 cm^2. - Left atrium: The atrium was moderately dilated. - Right atrium: The atrium was mildly to moderately dilated. - Tricuspid valve: There was mild-moderate regurgitation. - Pulmonary arteries: Systolic pressure was moderately increased. PA peak pressure: 58 mm Hg (S).   Antimicrobials:   None  Subjective: No fever, no nausea, no vomiting and no CP. Reported breathing is stable. Slightly over 2L urine output overnight   Objective: Vitals:   08/12/17 0437 08/12/17 0841 08/12/17 1110 08/12/17 1329  BP: 133/60 132/62  139/89  Pulse: 77 64  70  Resp: _0 Temp: 98.1 F (36.7 C) 97.9 F (36.6 C)  97.8 F (36.6 C)  TempSrc: Oral Oral  Oral  SpO2: 98% 91% 92% 95%  Weight: (!) 149.1 kg (328 lb 9.6 oz)     Height:        Intake/Output Summary (Last 24 hours) at 08/12/2017 1738 Last data filed at 08/12/2017 1052 Gross per 24 hour  Intake 662 ml  Output 2800 ml  Net -2138 ml   Filed Weights   08/10/17 1645 08/11/17 0532 08/12/17 0437  Weight: (!) 153.2 kg (337 lb 12.8 oz) (!) 150.5 kg (331 lb 14.4 oz) (!) 149.1 kg (328 lb 9.6 oz)   Physical Exam  Constitutional: no fever, denies CP and reported breathing is back to baseline. Patient continue to have difficulties following diet control even while inpatient (eating frequently and receiving food from outside, brought by family members) CVS: RRR, no rubs, no gallops. No appreciated JVD in this obese patient. Pulmonary: good air movement, no wheezing, no crackles Abdominal: obese, NT, ND, positive BS.  Musculoskeletal: trace edema bilateral (patient with  underlying mild lymphedema from obesity). No cyanosis, no clubbing  Neuro: AAOX3, no focal neurologic deficit appreciated.  Data Reviewed: I have personally reviewed following labs and imaging studies  CBC: Recent Labs  Lab 08/07/17 0459 08/08/17 0551 08/09/17 0422 08/10/17 0433 08/11/17 0425  WBC 7.4 6.4 7.2 6.5 6.3  HGB 7.9* 8.0* 8.0* 8.9* 9.0*  HCT 27.9* 27.5* 28.2* 30.7* 31.2*  MCV 72.8* 71.8* 74.4* 74.9* 75.2*  PLT 249 191 220 219 803   Basic Metabolic Panel: Recent Labs  Lab 08/06/17 0451 08/07/17 0459 08/08/17 0551 08/09/17 0422 08/10/17 0433 08/11/17 0425 08/12/17 0429  NA 137 137 137 139 137 140 138  K 3.7 3.8 3.9 3.3* 3.6 3.4* 3.6  CL 101 99* 100* 97* 97* 98* 97*  CO2 _1 33* 32 34* 32  GLUCOSE 274* 204* 218* 176* 225* 176* 210*  BUN 27* 27* 23* 22* 21* 25* 26*  CREATININE 0.83 0.76 0.70 0.81 0.74 0.79 0.88  CALCIUM 8.9 8.7* 8.7* 8.9 8.8* 9.0 9.0  MG 1.7 1.7 1.8  --  1.6* 2.0  --    CBG: Recent Labs  Lab 08/11/17 2048 08/12/17 0047 08/12/17 0731 08/12/17 1114 08/12/17 1648  GLUCAP 339* 206* 204* 275* 225*   Urine analysis:    Component Value Date/Time   COLORURINE YELLOW 08/06/2016 1245   APPEARANCEUR TURBID (A) 08/06/2016 1245   LABSPEC 1.028 08/06/2016 1245   PHURINE 5.5 08/06/2016 1245   GLUCOSEU 2+ (A) 08/06/2016 1245   HGBUR NEGATIVE 08/06/2016 1245  BILIRUBINUR NEGATIVE 08/06/2016 1245   BILIRUBINUR neg 09/10/2014 1219   KETONESUR NEGATIVE 08/06/2016 1245   PROTEINUR 2+ (A) 08/06/2016 1245   UROBILINOGEN 1.0 09/10/2014 1219   UROBILINOGEN 0.2 12/07/2013 0041   NITRITE NEGATIVE 08/06/2016 1245   LEUKOCYTESUR NEGATIVE 08/06/2016 1245    Radiology Studies: No results found.  Scheduled Meds: . aspirin EC  81 mg Oral Daily  . [START ON 08/13/2017] bumetanide  4 mg Oral BID  . carvedilol  25 mg Oral BID  . enoxaparin (LOVENOX) injection  80 mg Subcutaneous Q24H  . ferrous sulfate  325 mg Oral TID WC  . insulin aspart  0-20  Units Subcutaneous TID WC  . insulin aspart  0-5 Units Subcutaneous QHS  . insulin glargine  25 Units Subcutaneous Daily  . losartan  100 mg Oral Daily  . oxybutynin  15 mg Oral QHS  . potassium chloride  30 mEq Oral Daily  . risperiDONE  4 mg Oral Daily  . senna-docusate  1 tablet Oral BID   Continuous Infusions: . furosemide Stopped (08/12/17 1022)  . magnesium sulfate 1 - 4 g bolus IVPB Stopped (08/10/17 1059)     LOS: 8 days   Time spent: 25 minutes   Barton Dubois, MD [facility] Pager [phone]  If 7PM-7AM, please contact night-coverage [email or web address] Password [facility] 08/12/2017, 5:38 PM

## 2017-08-13 DIAGNOSIS — E876 Hypokalemia: Secondary | ICD-10-CM

## 2017-08-13 DIAGNOSIS — E1165 Type 2 diabetes mellitus with hyperglycemia: Secondary | ICD-10-CM

## 2017-08-13 LAB — BASIC METABOLIC PANEL WITH GFR
Anion gap: 11 (ref 5–15)
BUN: 22 mg/dL — ABNORMAL HIGH (ref 6–20)
CO2: 31 mmol/L (ref 22–32)
Calcium: 8.9 mg/dL (ref 8.9–10.3)
Chloride: 97 mmol/L — ABNORMAL LOW (ref 101–111)
Creatinine, Ser: 0.77 mg/dL (ref 0.44–1.00)
GFR calc Af Amer: >60 mL/min
GFR calc non Af Amer: >60 mL/min
Glucose, Bld: 203 mg/dL — ABNORMAL HIGH (ref 65–99)
Potassium: 3.3 mmol/L — ABNORMAL LOW (ref 3.5–5.1)
Sodium: 139 mmol/L (ref 135–145)

## 2017-08-13 LAB — GLUCOSE, CAPILLARY
Glucose-Capillary: 197 mg/dL — ABNORMAL HIGH (ref 65–99)
Glucose-Capillary: 226 mg/dL — ABNORMAL HIGH (ref 65–99)

## 2017-08-13 MED ORDER — FERROUS SULFATE 325 (65 FE) MG PO TABS: 325.0000 mg | ORAL_TABLET | Freq: Three times a day (TID) | ORAL | 2 refills | Status: DC

## 2017-08-13 MED ORDER — ASPIRIN 81 MG PO TBEC: 81.0000 mg | DELAYED_RELEASE_TABLET | Freq: Every day | ORAL | 1 refills | Status: DC

## 2017-08-13 MED ORDER — BUMETANIDE 2 MG PO TABS: 6.0000 mg | ORAL_TABLET | Freq: Two times a day (BID) | ORAL | 1 refills | Status: DC

## 2017-08-13 MED ORDER — BUMETANIDE 2 MG PO TABS: 6.0000 mg | ORAL_TABLET | Freq: Two times a day (BID) | ORAL | 0 refills | Status: DC

## 2017-08-13 MED ORDER — POTASSIUM CHLORIDE CRYS ER 15 MEQ PO TBCR: 30.0000 meq | EXTENDED_RELEASE_TABLET | Freq: Every day | ORAL | 1 refills | Status: DC

## 2017-08-13 NOTE — Progress Notes (Signed)
Progress Note  Patient Name: [name] Date of Encounter: 08/13/2017  Primary Cardiologist: Dr. [name]  Subjective   Breathing at baseline and edema significantly improved. She denies any chest pain or palpitations.   Inpatient Medications    Scheduled Meds: . aspirin EC  81 mg Oral Daily  . bumetanide  6 mg Oral BID  . carvedilol  25 mg Oral BID  . enoxaparin (LOVENOX) injection  80 mg Subcutaneous Q24H  . ferrous sulfate  325 mg Oral TID WC  . insulin aspart  0-20 Units Subcutaneous TID WC  . insulin aspart  0-5 Units Subcutaneous QHS  . insulin glargine  25 Units Subcutaneous Daily  . losartan  100 mg Oral Daily  . oxybutynin  15 mg Oral QHS  . potassium chloride  30 mEq Oral Daily  . risperiDONE  4 mg Oral Daily  . senna-docusate  1 tablet Oral BID   Continuous Infusions: . magnesium sulfate 1 - 4 g bolus IVPB Stopped (08/10/17 1059)   PRN Meds: nitroGLYCERIN, oxyCODONE   Vital Signs    Vitals:   08/12/17 1110 08/12/17 1329 08/12/17 2028 08/13/17 0534  BP:  139/89 (!) 144/78 104/87  Pulse:  70 60 63  Resp:  18 18 18   Temp:  97.8 F (36.6 C) 98.1 F (36.7 C) 98 F (36.7 C)  TempSrc:  Oral Oral Oral  SpO2: 92% 95% 97% 99%  Weight:    (!) 326 lb 1.6 oz (147.9 kg)  Height:        Intake/Output Summary (Last 24 hours) at 08/13/2017 0816 Last data filed at 08/13/2017 0600 Gross per 24 hour  Intake 360 ml  Output 2100 ml  Net -1740 ml   Filed Weights   08/11/17 0532 08/12/17 0437 08/13/17 0534  Weight: (!) 331 lb 14.4 oz (150.5 kg) (!) 328 lb 9.6 oz (149.1 kg) (!) 326 lb 1.6 oz (147.9 kg)    Telemetry    NSR, HR in 60's - 70's. 5 beats NSVT. - Personally Reviewed  ECG    No new tracings.   Physical Exam   General: Well developed, obese female appearing in no acute distress. Head: Normocephalic, atraumatic.  Neck: Supple without bruits, JVD difficult to assess secondary to body habitus. Lungs:  Resp regular and unlabored, CTA without  wheezing or rales. Heart: RRR, S1, S2, no S3, S4, or murmur; no rub. Abdomen: Soft, non-tender, non-distended with normoactive bowel sounds. No hepatomegaly. No rebound/guarding. No obvious abdominal masses. Extremities: No clubbing or cyanosis, trace lower extremity edema. Distal pedal pulses are 2+ bilaterally. Neuro: Alert and oriented X 3. Moves all extremities spontaneously. Psych: Normal affect.  Labs    Chemistry Recent Labs  Lab 08/11/17 0425 08/12/17 0429 08/13/17 0435  NA 140 138 139  K 3.4* 3.6 3.3*  CL 98* 97* 97*  CO2 34* 32 31  GLUCOSE 176* 210* 203*  BUN 25* 26* 22*  CREATININE 0.79 0.88 0.77  CALCIUM 9.0 9.0 8.9  GFRNONAA >60 >60 >60  GFRAA >60 >60 >60  ANIONGAP 8 9 11      Hematology Recent Labs  Lab 08/09/17 0422 08/10/17 0433 08/11/17 0425  WBC 7.2 6.5 6.3  RBC 3.79* 4.10 4.15  HGB 8.0* 8.9* 9.0*  HCT 28.2* 30.7* 31.2*  MCV 74.4* 74.9* 75.2*  MCH 21.1* 21.7* 21.7*  MCHC 28.4* 29.0* 28.8*  RDW 19.9* 20.6* 21.7*  PLT 220 219 235    Cardiac EnzymesNo results for input(s): TROPONINI in the last 168 hours.  No results for input(s): TROPIPOC in the last 168 hours.   BNPNo results for input(s): BNP, PROBNP in the last 168 hours.   DDimer No results for input(s): DDIMER in the last 168 hours.   Radiology    No results found.  Cardiac Studies   Echocardiogram: 08/06/2017 Study Conclusions  - Left ventricle: The cavity size was normal. Wall thickness was   increased in a pattern of moderate LVH. Systolic function was   normal. The estimated ejection fraction was in the range of 55%   to 60%. Wall motion was normal; there were no regional wall   motion abnormalities. Features are consistent with a pseudonormal   left ventricular filling pattern, with concomitant abnormal   relaxation and increased filling pressure (grade 2 diastolic   dysfunction). - Mitral valve: Moderately to severely calcified annulus. Valve   area by continuity equation  (using LVOT flow): 1.8 cm^2. - Left atrium: The atrium was moderately dilated. - Right atrium: The atrium was mildly to moderately dilated. - Tricuspid valve: There was mild-moderate regurgitation. - Pulmonary arteries: Systolic pressure was moderately increased.   PA peak pressure: 58 mm Hg (S).  Patient Profile     51 y.o. female withPMHof hypertensive heart disease, chronic diastolic HF, HTN, Type 2DM, obesityand OSA(on CPAP) who presented to [facility] ED on 08/02/2017 for evaluation of worsening dyspnea, admitted for CHF exacerbation.  Assessment & Plan    1. Acute on Chronic Diastolic CHF - presented with worsening dyspnea at rest and on exertionin the setting of medication non-compliance with home diuretics.BNP at 250 on admission with CXR showing interstitial edema.  - she diuresed well with IV Lasix 80mg  BID with a net output of -17.4L and weight down from 347 lbs to 326 lbs. Creatinine remains stable at 0.77. She lost IV access and has refused further attempts, therefore she has been switched to Bumex 6mg  BID.  - sodium and fluid restriction reviewed as this will be the greatest barrier in the outpatient setting (reviewing progress note from 11/15 at which time the patient was consuming fried chicken).   2. Atypical Chest Pain - reported chest pain at that time of admission. Troponin value was negative and EKG showed no acute ischemic changes.  - NST in 04/2016 showed no evidence of ischemia and was low-risk. - no plans for further ischemic evaluation at this time.   3. HTN - BP has been stable at 104/62 - 144/89 within the past 24 hours.  - continue current medication regimen.   4.Microcytic anemia - baseline 10.0 - 11.0, at 9.0 on 11/14. Occult blood positive.  - she denies any evidence of active bleeding. Has been started on iron supplementation.  - per admitting team.   5. Morbid Obesity - BMI at 54. Diet and exercise strongly encouraged. - she is interested in  surgical options. Plans to establish care with a Weight Management clinic at the time of hospital discharge.  6. Hypomagnesemia/ Hypokalemia - resolved. Mg at 2.0 on 08/11/2017. - K+ 3.3 this AM. Replacement ordered.     Will arrange for 7-10 day Cardiology follow-up. She will need a repeat BMET at that time to assess kidney function and electrolytes.    For questions or updates, please contact [place] Please consult [email or web address] for contact info under Cardiology/STEMI.   Signed, Erma Heritage , PA-C 8:16 AM 08/13/2017 Pager: [phone]

## 2017-08-13 NOTE — Progress Notes (Signed)
08/13/17  1745  Patient given Bumex rx for 6mg  12 tablet to tak to [facility] with coupon to get medicine for around $12.

## 2017-08-13 NOTE — Plan of Care (Signed)
  Skin Integrity: Risk for impaired skin integrity will decrease 08/13/2017 1553 - Progressing by Dorene Sorrow, RN   Health Behavior/Discharge Planning: Ability to manage health-related needs will improve 08/13/2017 1553 - Progressing by Dorene Sorrow, RN

## 2017-08-13 NOTE — Discharge Summary (Signed)
Physician Discharge Summary  [name] PPJ:[id number] DOB: 1965/12/15 DOA: 08/03/2017  PCP: Tresa Garter, MD  Admit date: 08/03/2017 Discharge date: 08/13/2017  Time spent: 35 minutes  Recommendations for Outpatient Follow-up:  1. Repeat BMET to follow electrolytes and renal function 2. Please follow Mg level and further replete as needed  3. Close follow up to CBG's and diabetes; please adjust hypoglycemic regimen as needed. 4. Patient needs to follow up with weight management clinic/bariatric center.   Discharge Diagnoses:  Active Problems:   Obesity, Class III, BMI 40-49.9 (morbid obesity) (HCC)   Benign essential HTN   Acute on chronic diastolic CHF (congestive heart failure) (HCC)   Cor pulmonale (chronic) (HCC)   Acute on chronic respiratory failure with hypoxia (HCC)   Uncontrolled type 2 diabetes mellitus with hyperglycemia (HCC)   Hypokalemia   Discharge Condition: stable and improved. Discharge home with instructions to follow up with PCP and with cardiology service at discharge.  Diet recommendation: low sodium, low calorie and modified carbohydrates.  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  Hospital Course:  Acute on chronic diastolic CHF exacerbation: -Repeat echocardiogram demonstrating grade 2 diastolic dysfunction with preserved ejection fraction and no wall motion abnormalities. -Patient reporting significant improvement in her overall shortness of breath and orthopnea. Normal O2 sat on  RA. -Approximately 18L negative during admission  -Creatinine level has remained stable -Following cardiology recommendations will discharge on Bumex 6 mg BID -outpatient follow up with cardiology service for further adjustment to her medication regimen. -Continue daily weights and low-sodium diet -continue b-blocker and losartan   Anemia of chronic disease -No evidence of active bleeding but FOBT was positive  -Iron levels found to be low and the patient has been started on iron supplementation.  -Hg overall stable, pt prefers to wait for now on any endoscopic studies until her breathing is better and I agree with that. -can likely have an outpatient work up  Non-insulin-dependent DM type II -Uncontrolled  -Last A1c 9.7  -Patient declining the use of insulin at discharge -Normally on metformin and glipizide (dose max out).   -Continue modified carbohydrate diet.  -will recommend close follow up and initiation of insulin (at least levemir once a day)  Hypomagnesemia -Repleted -Most likely associated with active diuresis -Will recommend monitoring electrolytes at follow up and further replete as needed   Hypokalemia -Associated with ongoing diuresis -Will continue daily maintenance potassium supplementation -recommend BMET at follow up to assess electrolytes trend    HTN -Reasonable inpatient control -Will continue Cozaar, Coreg and Lasix for now.   -Heart healthy diet has been ordered  Mediastinal adenopathy/Mycobacterium avium complex (HCC) -Not currently treated,ace level Wnl, negative quantiferon test, negative HIV,followed by Dr Lamonte Sakai -Patient to have follow-up as an outpatient with pulmonary service. -Remains stable and afebrile.   OSA -continue CPAP QHS -patient educated about need for compliance.  Depression/anxiety: -Mood overall stable -No suicidal ideation or hallucinations -Continue Risperdal  Morbid obesity - Body mass index is 51.47 kg/m.   -Low calorie diet, increase exercise has been discussed with  patient   Procedures:  2D echo: 08/06/2017 - Left ventricle: The cavity size was normal. Wall thickness was increased in a pattern of moderate LVH. Systolic function was normal. The estimated ejection fraction was in the range of 55% to 60%. Wall motion was normal; there were no regional wall motion abnormalities. Features are consistent with a pseudonormal left ventricular filling pattern, with concomitant abnormal relaxation and increased filling pressure (grade 2 diastolic dysfunction). - Mitral valve: Moderately to severely calcified annulus. Valve area by continuity equation (using LVOT flow): 1.8 cm^2. - Left atrium: The atrium was moderately dilated. - Right atrium: The atrium was mildly to moderately dilated. - Tricuspid valve: There was mild-moderate regurgitation. - Pulmonary arteries: Systolic pressure was moderately increased. PA peak pressure: 58 mm Hg (S).   Consultations:  Cardiology   Discharge Exam: Vitals:   08/13/17 1026 08/13/17 1310  BP: 140/78 (!) 99/45  Pulse: 66 70  Resp: 20 20  Temp:  97.8 F (36.6 C)  SpO2: 97% 98%   Constitutional: no fever, denies CP and reported breathing is back to baseline. No nausea, no vomiting and continue to have good urine output. CVS: RRR, no rubs, no gallops. No appreciated JVD in this obese patient. Pulmonary: good air movement, no wheezing, no crackles Abdominal: obese, NT, ND, positive BS.  Musculoskeletal: trace edema bilateral (patient with underlying mild lymphedema from obesity). No cyanosis, no clubbing  Neuro: AAOX3, no focal neurologic deficit appreciated.  Discharge Instructions   Discharge Instructions    (HEART FAILURE PATIENTS) Call MD:  Anytime you have any of the following symptoms: 1) 3 pound weight gain in 24 hours or 5 pounds in 1 week 2) shortness of breath, with or without a dry hacking cough 3) swelling in the  hands, feet or stomach 4) if you have to sleep on extra pillows at night in order to breathe.   Complete by:  As directed    Diet - low sodium heart healthy   Complete by:  As directed    Discharge instructions   Complete by:  As directed    Take medications as prescribed Follow up with cardiology as instructed Follow low sodium diet (less than 2 gram daily) Follow low calorie diet and increase exercise to lose weight Maintain adequate hydration     Current Discharge Medication List    START taking these medications   Details  aspirin EC 81 MG EC tablet Take 1 tablet (81 mg total) daily by mouth. Qty: 30 tablet, Refills: 1    bumetanide (BUMEX) 2 MG tablet Take 3 tablets (6 mg total) 2 (two) times daily by mouth. Qty: 180 tablet, Refills: 1    ferrous sulfate 325 (65 FE) MG tablet Take 1 tablet (325 mg total) 3 (three) times daily with meals by mouth. Qty: 90 tablet, Refills: 2    potassium chloride (KLOR-CON M15) 15 MEQ tablet Take 2 tablets (30 mEq total) daily by mouth. Qty: 60 tablet, Refills: 1      CONTINUE these medications which have NOT CHANGED   Details  amLODipine (NORVASC) 10 MG tablet Take 1 tablet (10 mg total) by mouth daily. Qty: 90 tablet, Refills: 3   Associated Diagnoses: Essential hypertension    carvedilol (COREG) 25 MG tablet Take 1 tablet (25 mg total) by mouth 2 (two) times daily. Qty: 180 tablet, Refills: 3   Associated Diagnoses: Essential hypertension; Chronic diastolic heart failure (HCC)    glipiZIDE (GLUCOTROL XL) 10 MG 24 hr tablet Take 1 tablet (  10 mg total) by mouth daily with breakfast. Qty: 90 tablet, Refills: 3   Associated Diagnoses: Type 2 diabetes mellitus without complication, with long-term current use of insulin (HCC)    losartan (COZAAR) 100 MG tablet Take 1 tablet (100 mg total) by mouth daily. Qty: 90 tablet, Refills: 3   Associated Diagnoses: Essential hypertension    metFORMIN (GLUCOPHAGE) 1000 MG tablet Take 1 tablet  (1,000 mg total) by mouth 2 (two) times daily with a meal. Qty: 180 tablet, Refills: 3   Associated Diagnoses: Type 2 diabetes mellitus without complication, with long-term current use of insulin (HCC)    nitroGLYCERIN (NITROSTAT) 0.4 MG SL tablet Place 0.4 mg under the tongue every 5 (five) minutes as needed for chest pain.    oxybutynin (DITROPAN XL) 15 MG 24 hr tablet TAKE 1 TABLET BY MOUTH AT BEDTIME. Qty: 30 tablet, Refills: 3   Associated Diagnoses: Stress incontinence in female    risperiDONE (RISPERDAL) 2 MG tablet Take 2 tablets (4 mg total) by mouth daily. Qty: 60 tablet, Refills: 3   Associated Diagnoses: Bipolar 1 disorder, mixed, moderate (HCC)    glucose blood test strip Use as instructed Qty: 100 each, Refills: 12   Associated Diagnoses: IDDM (insulin dependent diabetes mellitus) (HCC)      STOP taking these medications     furosemide (LASIX) 80 MG tablet      potassium chloride (K-DUR) 10 MEQ tablet        Allergies  Allergen Reactions  . Acetaminophen     Fits as a child "seizures-like"  . Caffeine     Tense, anxiety, increased urination  . [place] [Dapagliflozin] Other (See Comments)    Hallucinations, drop in blood sugar.   . Lisinopril Rash    Rash with lisinopril; but fosinopril is ok per patient   Follow-up Information    Erma Heritage, PA-C Follow up on 08/26/2017.   Specialties:  Physician Assistant, Cardiology Why:  Cardiology Follow-Up on 08/26/2017 at 3:30PM with Bernerd Pho, PA-C (Dr. Jacalyn Lefevre PA).  Contact information: [address] [place] [address] [phone]        Tresa Garter, MD. Schedule an appointment as soon as possible for a visit in 10 day(s).   Specialty:  Internal Medicine Contact information: [place]  [address] [phone]           The results of significant diagnostics from this hospitalization (including imaging, microbiology, ancillary and  laboratory) are listed below for reference.    Significant Diagnostic Studies: Dg Chest 2 View  Result Date: 08/03/2017 CLINICAL DATA:  Left-sided chest pain and shortness of breath increasing over the past 9 days. EXAM: CHEST  2 VIEW COMPARISON:  08/02/2017 FINDINGS: Cardiac enlargement with pulmonary vascular congestion. Hazy perihilar infiltrates consistent with edema. Mild progression since previous study. No focal consolidation. No blunting of costophrenic angles. No pneumothorax. Mediastinal contours appear intact. Small amount of fluid in the fissures. Old deformity of the distal right clavicle. IMPRESSION: Cardiac enlargement with pulmonary vascular congestion and perihilar edema. Mild progression since previous study. Electronically Signed   By: Lucienne Capers M.D.   On: 08/03/2017 06:55   Dg Chest 2 View  Result Date: 08/02/2017 CLINICAL DATA:  Chest pain 1 week. EXAM: CHEST  2 VIEW COMPARISON:  07/20/2017 FINDINGS: Lungs are adequately inflated with persistent bilateral perihilar and bibasilar opacification likely interstitial edema which has slightly worse. Cannot exclude infection in the lung bases. No evidence of effusion. Mild stable  cardiomegaly. Remainder of the exam is unchanged. IMPRESSION: Mild stable cardiomegaly with findings suggesting interstitial edema slightly worse. Could not exclude infection in the lung bases. Electronically Signed   By: Marin Olp M.D.   On: 08/02/2017 19:56   Dg Chest 2 View  Result Date: 07/20/2017 CLINICAL DATA:  Shortness of breath for the past 2-3 weeks. EXAM: CHEST  2 VIEW COMPARISON:  12/03/2016 and chest CT dated 12/15/2016. FINDINGS: Mildly progressive enlargement of the cardiac silhouette. Increased inspiration with mildly improved pulmonary vascular congestion and prominent interstitial markings. Tiny left pleural effusion. Unremarkable bones. IMPRESSION: 1. Mildly progressive cardiomegaly with mildly improved pulmonary vascular congestion  and interstitial pulmonary edema. 2. Tiny left pleural effusion. Electronically Signed   By: Claudie Revering M.D.   On: 07/20/2017 14:09   Ct Chest High Resolution  Result Date: 08/06/2017 CLINICAL DATA:  Chronic dyspnea.  Mycobacterial infection. EXAM: CT CHEST WITHOUT CONTRAST TECHNIQUE: Multidetector CT imaging of the chest was performed following the standard protocol without intravenous contrast. High resolution imaging of the lungs, as well as inspiratory and expiratory imaging, was performed. COMPARISON:  12/15/2016 chest CT.  08/03/2017 chest radiograph. FINDINGS: Cardiovascular: Stable mild cardiomegaly. Stable trace pericardial effusion/ thickening. Normal course and caliber of the thoracic aorta. Stable dilated main pulmonary artery (3.5 cm diameter). Mediastinum/Nodes: No discrete thyroid nodules. Unremarkable esophagus. No axillary adenopathy. No pathologically enlarged mediastinal lymph nodes. Mild bilateral hilar adenopathy appears stable and is poorly delineated on these noncontrast images. Lungs/Pleura: No pneumothorax. No pleural effusion. No acute consolidative airspace disease, lung masses or significant pulmonary nodules. A few stable punctate calcified granulomas are present in both lungs. No significant air trapping on the expiration sequence. Scattered mild interlobular septal thickening in both lungs. There is relatively uniform ground-glass attenuation throughout both lungs, unchanged. No significant regions of traction bronchiectasis, architectural distortion or frank honeycombing. Upper abdomen: Unremarkable. Musculoskeletal: No aggressive appearing focal osseous lesions. Mild thoracic spondylosis. IMPRESSION: 1. Stable relatively uniform ground-glass attenuation throughout both lungs. Given the cardiomegaly and mild interlobular septal thickening, the ground-glass attenuation is favored to represent mild pulmonary edema. 2. No significant traction bronchiectasis, architectural  distortion or frank honeycombing to suggest chronic infection or interstitial lung disease. 3. Stable mild bilateral hilar lymphadenopathy, most compatible with benign reactive adenopathy. 4. Stable dilated main pulmonary artery, suggesting chronic pulmonary arterial hypertension. Electronically Signed   By: Ilona Sorrel M.D.   On: 08/06/2017 10:24   Labs: Basic Metabolic Panel: Recent Labs  Lab 08/07/17 0459 08/08/17 0551 08/09/17 0422 08/10/17 0433 08/11/17 0425 08/12/17 0429 08/13/17 0435  NA 137 137 139 137 140 138 139  K 3.8 3.9 3.3* 3.6 3.4* 3.6 3.3*  CL 99* 100* 97* 97* 98* 97* 97*  CO2 27 29 33* 32 34* 32 31  GLUCOSE 204* 218* 176* 225* 176* 210* 203*  BUN 27* 23* 22* 21* 25* 26* 22*  CREATININE 0.76 0.70 0.81 0.74 0.79 0.88 0.77  CALCIUM 8.7* 8.7* 8.9 8.8* 9.0 9.0 8.9  MG 1.7 1.8  --  1.6* 2.0  --   --    CBC: Recent Labs  Lab 08/07/17 0459 08/08/17 0551 08/09/17 0422 08/10/17 0433 08/11/17 0425  WBC 7.4 6.4 7.2 6.5 6.3  HGB 7.9* 8.0* 8.0* 8.9* 9.0*  HCT 27.9* 27.5* 28.2* 30.7* 31.2*  MCV 72.8* 71.8* 74.4* 74.9* 75.2*  PLT 249 191 220 219 235    BNP (last 3 results) Recent Labs    12/03/16 1115 08/03/17 0553  BNP 204.5* 250.5*  CBG: Recent Labs  Lab 08/12/17 1114 08/12/17 1648 08/12/17 2043 08/13/17 0714 08/13/17 1149  GLUCAP 275* 225* 204* 197* 226*    Signed:  Barton Dubois MD.  [facility] 08/13/2017, 2:25 PM

## 2017-08-13 NOTE — Discharge Instructions (Signed)
Limit daily fluid intake to less than 2 Liters per day. Please limit salt intake.  Please weight yourself every morning. Call cardiology if weight increases by 3 pounds overnight or 5 pounds in a single week.

## 2017-08-13 NOTE — Progress Notes (Signed)
08/13/17  1730  Reviewed discharge instructions with patient. Patient and her mother verbalized understanding of discharge instructions. Copy of discharge instructions and prescriptions given to patient.

## 2017-08-13 NOTE — Progress Notes (Signed)
This CM spoke with patient about new prescription for Bumex. Pt states she has an [employer] and all her generic prescriptions are $10.  Marney Doctor RN,BSN,NCM [phone]

## 2017-08-16 MED FILL — !VENTOLIN HFA INHALER: 108 (90 BAS | 25 days supply | Qty: 18 | Fill #1

## 2017-08-16 MED FILL — OXYBUTYNIN CL ER 15 MG TAB: 15 | 30 days supply | Qty: 30 | Fill #1

## 2017-08-16 MED FILL — FERROUS SULFATE 325 MG TAB: 325 (65 FE) | 30 days supply | Qty: 90 | Fill #0

## 2017-08-16 MED FILL — ?METFORMIN HCL 1,000 MG TAB: 1000 | 30 days supply | Qty: 60 | Fill #2

## 2017-08-16 MED FILL — ?CARVEDILOL 25 MG TABLET: 25 | 30 days supply | Qty: 60 | Fill #1

## 2017-08-16 MED FILL — LOSARTAN POTASSIUM 100 MG T: 100 | 30 days supply | Qty: 30 | Fill #1

## 2017-08-16 MED FILL — ?FUROSEMIDE 80MG TABLET: 80 | 30 days supply | Qty: 30 | Fill #1

## 2017-08-16 MED FILL — BUMETANIDE 2 MG TABLET: 2 | 30 days supply | Qty: 180 | Fill #0

## 2017-08-16 MED FILL — ?POTASSIUM CL ER 10MEQ TAB: 10 | 30 days supply | Qty: 90 | Fill #0

## 2017-08-16 MED FILL — AMLODIPINE BESYLATE 10 MG T: 10 | 30 days supply | Qty: 30 | Fill #2

## 2017-08-16 MED FILL — glipiZIDE XL 10 MG TB24: 10 | 30 days supply | Qty: 30 | Fill #1

## 2017-08-24 ENCOUNTER — Ambulatory Visit: Payer: No Typology Code available for payment source | Attending: Internal Medicine | Admitting: Physician Assistant

## 2017-08-24 VITALS — BP 138/77 | HR 75 | Temp 98.3°F | Wt 330.0 lb

## 2017-08-24 DIAGNOSIS — F319 Bipolar disorder, unspecified: Secondary | ICD-10-CM | POA: Insufficient documentation

## 2017-08-24 DIAGNOSIS — I5033 Acute on chronic diastolic (congestive) heart failure: Secondary | ICD-10-CM

## 2017-08-24 DIAGNOSIS — Z9119 Patient's noncompliance with other medical treatment and regimen: Secondary | ICD-10-CM | POA: Insufficient documentation

## 2017-08-24 DIAGNOSIS — Z09 Encounter for follow-up examination after completed treatment for conditions other than malignant neoplasm: Secondary | ICD-10-CM

## 2017-08-24 DIAGNOSIS — Z79899 Other long term (current) drug therapy: Secondary | ICD-10-CM | POA: Insufficient documentation

## 2017-08-24 DIAGNOSIS — I110 Hypertensive heart disease with heart failure: Secondary | ICD-10-CM | POA: Insufficient documentation

## 2017-08-24 DIAGNOSIS — Z794 Long term (current) use of insulin: Secondary | ICD-10-CM

## 2017-08-24 DIAGNOSIS — E119 Type 2 diabetes mellitus without complications: Secondary | ICD-10-CM | POA: Insufficient documentation

## 2017-08-24 DIAGNOSIS — Z7982 Long term (current) use of aspirin: Secondary | ICD-10-CM | POA: Insufficient documentation

## 2017-08-24 DIAGNOSIS — Z7984 Long term (current) use of oral hypoglycemic drugs: Secondary | ICD-10-CM | POA: Insufficient documentation

## 2017-08-24 DIAGNOSIS — D509 Iron deficiency anemia, unspecified: Secondary | ICD-10-CM | POA: Insufficient documentation

## 2017-08-24 DIAGNOSIS — G4733 Obstructive sleep apnea (adult) (pediatric): Secondary | ICD-10-CM | POA: Insufficient documentation

## 2017-08-24 DIAGNOSIS — Z6841 Body Mass Index (BMI) 40.0 and over, adult: Secondary | ICD-10-CM | POA: Insufficient documentation

## 2017-08-24 LAB — GLUCOSE, POCT (MANUAL RESULT ENTRY): POC Glucose: 180 mg/dl — AB (ref 70–99)

## 2017-08-24 MED ORDER — BUMETANIDE 2 MG PO TABS: 4.0000 mg | ORAL_TABLET | Freq: Two times a day (BID) | ORAL | 2 refills | Status: DC

## 2017-08-24 NOTE — Patient Instructions (Signed)
Ok to decrease your Bumex form 3 tabs twice daily to two tabs twice daily for now

## 2017-08-24 NOTE — Progress Notes (Signed)
Cardiology Office Note    Date:  08/26/2017   ID:  Natallia, Stellmach December 05, 1965, MRN [id number]  PCP:  Tresa Garter, MD  Cardiologist: Dr. [name]   Chief Complaint  Patient presents with  . Hospitalization Follow-up    History of Present Illness:    [name] is a 51 y.o. female withpast ,edical history of hypertensive heart disease, chronic diastolic HF, HTN, Type 2DM, obesityand OSA(noncompliant with CPAP) who presents to the office today for hospital follow-up.   She was recently admitted to [facility] from 11/6 - 08/13/2017 for evaluation of worsening dyspnea. Was found to have an elevated BNP of 250 on admission with cyclic troponin values being negative. She was diuresed with IV Lasix and down -17.4L at the time of discharge with weight from 347 lbs to 326 lbs. She was started on Bumex 6mg  BID at the time of discharge.   In talking with the patient today, she reports doing well since her recent hospitalization. Reports breathing has been at baseline and she denies any recent dyspnea on exertion, orthopnea, PND, or lower extremity edema. No recent chest pain or palpitations.   She was evaluated by her PCP earlier this week and Bumex was reduced to 4mg  BID. She tells me her provider thought the dose was too high but by review of notes, the patient reported feeling like it was "too much". Labs were checked at that time and showed a stable creatinine of 0.76 with K+ at 4.3. She had been switched from Lasix to Bumex at the time of hospital discharge due to thinking the Lasix was not being well absorbed. The patient tells me today that she never took the medication once daily as prescribed and only took it once per week due to the urination side effects.    Past Medical History:  Diagnosis Date  . Bipolar disease, chronic ([place])   . Chest pain    a. 2012 Myoview: EF 63%, no isch/infarct;  b. 04/2016 Lexiscan MV: EF 73%, no ischemia/infarct-->Low risk.  . CHF  (congestive heart failure) ([place])   . Chronic diastolic CHF (congestive heart failure) ([place])    a. 2015 Echo: EF 55-60%, Gr2 DD;  b. 09/2015 Echo: EF 60-65%, no rwma, mod dil LA, PASP 63mmHg.  Marland Kitchen History of thyrotoxicosis   . Hypertensive heart disease 10/18/2013  . Insulin dependent type 2 diabetes mellitus, uncontrolled ([place])   . Mediastinal adenopathy   . MI (myocardial infarction) ([place])   . Morbid obesity with BMI of 50.0-59.9, adult ([place])   . OSA (obstructive sleep apnea) 03/06/2011    History reviewed. No pertinent surgical history.  Current Medications: Outpatient Medications Prior to Visit  Medication Sig Dispense Refill  . amLODipine (NORVASC) 10 MG tablet Take 1 tablet (10 mg total) by mouth daily. 90 tablet 3  . aspirin EC 81 MG EC tablet Take 1 tablet (81 mg total) daily by mouth. 30 tablet 1  . carvedilol (COREG) 25 MG tablet Take 1 tablet (25 mg total) by mouth 2 (two) times daily. 180 tablet 3  . ferrous sulfate 325 (65 FE) MG tablet Take 1 tablet (325 mg total) 3 (three) times daily with meals by mouth. 90 tablet 2  . glipiZIDE (GLUCOTROL XL) 10 MG 24 hr tablet Take 1 tablet (10 mg total) by mouth daily with breakfast. 90 tablet 3  . glucose blood test strip Use as instructed 100 each 12  . losartan (COZAAR) 100 MG tablet Take 1 tablet (100 mg  total) by mouth daily. 90 tablet 3  . metFORMIN (GLUCOPHAGE) 1000 MG tablet Take 1 tablet (1,000 mg total) by mouth 2 (two) times daily with a meal. 180 tablet 3  . nitroGLYCERIN (NITROSTAT) 0.4 MG SL tablet Place 0.4 mg under the tongue every 5 (five) minutes as needed for chest pain.    Marland Kitchen oxybutynin (DITROPAN XL) 15 MG 24 hr tablet TAKE 1 TABLET BY MOUTH AT BEDTIME. 30 tablet 3  . potassium chloride (KLOR-CON M15) 15 MEQ tablet Take 2 tablets (30 mEq total) daily by mouth. 60 tablet 1  . risperiDONE (RISPERDAL) 2 MG tablet Take 2 tablets (4 mg total) by mouth daily. 60 tablet 3  . bumetanide (BUMEX) 2 MG tablet Take 3 tablets (6 mg  total) 2 (two) times daily by mouth. 12 tablet 0  . bumetanide (BUMEX) 2 MG tablet Take 2 tablets (4 mg total) by mouth 2 (two) times daily. 120 tablet 2   No facility-administered medications prior to visit.      Allergies:   Acetaminophen; Caffeine; Farxiga [dapagliflozin]; and Lisinopril   Social History   Socioeconomic History  . Marital status: Single    Spouse name: None  . Number of children: 0  . Years of education: 74  . Highest education level: None  Social Needs  . Financial resource strain: None  . Food insecurity - worry: None  . Food insecurity - inability: None  . Transportation needs - medical: None  . Transportation needs - non-medical: None  Occupational History  . Occupation: unemployed  Tobacco Use  . Smoking status: Never Smoker  . Smokeless tobacco: Never Used  Substance and Sexual Activity  . Alcohol use: No  . Drug use: No  . Sexual activity: Not Currently    Birth control/protection: None  Other Topics Concern  . 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 But ran out of money and is no longer attending school. (Note patient has bipolar disorder).      Born in [place] but lived in [place] before coming back to [place].       Primary language is Arabic. Lives with mother and brother.              Family History:  The patient's family history includes Heart disease in her maternal grandfather and paternal grandfather; Heart failure in her father; Hypertension in her mother; Stroke in her father.   Review of Systems:   Please see the history of present illness.     General:  No chills, fever, night sweats or weight changes.  Cardiovascular:  No chest pain,edema, orthopnea, palpitations, paroxysmal nocturnal dyspnea. Positive for dyspnea on exertion (no acute changes).  Dermatological: No rash, lesions/masses Respiratory: No cough,  dyspnea Urologic: No hematuria, dysuria Abdominal:   No nausea, vomiting, diarrhea, bright red blood per rectum, melena, or hematemesis Neurologic:  No visual changes, wkns, changes in mental status.  All other systems reviewed and are otherwise negative except as noted above.   Physical Exam:    VS:  BP 122/84   Pulse 70   Ht 5\' 7"  (1.702 m)   Wt (!) 336 lb (152.4 kg)   LMP 08/03/2017 (Approximate)   BMI 52.63 kg/m    General: Well developed, morbidly obese female appearing in no acute distress. Head: Normocephalic, atraumatic, sclera non-icteric, no xanthomas, nares are without discharge.  Neck:  No carotid bruits. JVD unable to be assessed secondary to body habitus.   Lungs: Respirations regular and unlabored, without wheezes or rales.  Heart: Regular rate and rhythm. No S3 or S4.  No murmur, no rubs, or gallops appreciated. Abdomen: Soft, non-tender, non-distended with normoactive bowel sounds. No hepatomegaly. No rebound/guarding. No obvious abdominal masses. Msk:  Strength and tone appear normal for age. No joint deformities or effusions. Extremities: No clubbing or cyanosis. Trace lower extremity edema.  Distal pedal pulses are 2+ bilaterally. Neuro: Alert and oriented X 3. Moves all extremities spontaneously. No focal deficits noted. Psych:  Responds to questions appropriately with a normal affect. Skin: No rashes or lesions noted  Wt Readings from Last 3 Encounters:  08/26/17 (!) 336 lb (152.4 kg)  08/25/17 (!) 333 lb (151 kg)  08/24/17 (!) 330 lb (149.7 kg)     Studies/Labs Reviewed:   EKG:  EKG is not ordered today.   Recent Labs: 08/03/2017: ALT 11; B Natriuretic Peptide 250.5 08/04/2017: TSH 4.181 08/11/2017: Hemoglobin 9.0; Platelets 235 08/24/2017: BUN 20; Creatinine, Ser 0.76; Magnesium 1.6; Potassium 4.3; Sodium 139   Lipid Panel    Component Value Date/Time   CHOL 173 08/06/2016 1245   TRIG 212 (H) 08/06/2016 1245   HDL 45 (L) 08/06/2016 1245    CHOLHDL 3.8 08/06/2016 1245   VLDL 42 (H) 08/06/2016 1245   LDLCALC 86 08/06/2016 1245    Additional studies/ records that were reviewed today include:   Echocardiogram: 07/2017 Study Conclusions  - Left ventricle: The cavity size was normal. Wall thickness was   increased in a pattern of moderate LVH. Systolic function was   normal. The estimated ejection fraction was in the range of 55%   to 60%. Wall motion was normal; there were no regional wall   motion abnormalities. Features are consistent with a pseudonormal   left ventricular filling pattern, with concomitant abnormal   relaxation and increased filling pressure (grade 2 diastolic   dysfunction). - Mitral valve: Moderately to severely calcified annulus. Valve   area by continuity equation (using LVOT flow): 1.8 cm^2. - Left atrium: The atrium was moderately dilated. - Right atrium: The atrium was mildly to moderately dilated. - Tricuspid valve: There was mild-moderate regurgitation. - Pulmonary arteries: Systolic pressure was moderately increased.   PA peak pressure: 58 mm Hg (S).  Assessment:    1. Chronic diastolic heart failure ([place])   2. Hypertensive heart disease with heart failure ([place])   3. Essential hypertension   4. Morbid obesity ([place])   5. OSA (obstructive sleep apnea)- non compliant with C-pap      Plan:   In order of problems listed above:  1. Chronic Diastolic CHF/ Hypertensive Heart Disease - the patient was recently admitted for a CHF exacerbation and diuresed over 17.4L during admission. Weights were variable during admission but she reports this was at 333 lbs upon going home, but has increased to 336 lbs today due to recently reducing her Bumex dosing. She wishes to switch back to Lasix as outlined above (concern this was not being absorbed PTA but the patient was only taking it once weekly). With her weight gain and trace edema on examination, will stop Bumex and switch back to Lasix 80mg  in AM  and 40mg  in PM for the next 5 days then 80mg  daily. I informed the patient to call our office if weight goes above 340 lbs.  - sodium and fluid restriction were reviewed with the patient in great detail  as I am concerned she is not being compliant with this as she reports drinking 10+ cups of water daily and consumes fast food regularly. We reviewed that her fluid status will be even more challenging to control if changes are not made in this regard.  2. HTN - BP is well-controlled at 122/84 during today's visit. - continue current medication regimen.   3. Morbid Obesity - she expressed interest in referral to weight management during her hospitalization and wishes to proceed with this. Will place referral.   4. OSA - she has been noncompliant with CPAP but is attempting to re-try this. Compliance encouraged.  - followed by Pulmonology.   Medication Adjustments/Labs and Tests Ordered: Current medicines are reviewed at length with the patient today.  Concerns regarding medicines are outlined above.  Medication changes, Labs and Tests ordered today are listed in the Patient Instructions below. Patient Instructions  Medication Instructions:  STOP Bumex START Lasix Take 80mg  in the morning and 40 mg in the evenings for 5 days THEN Take 1 (80mg ) tablet once a day  Labwork: None   Testing/Procedures: None   Follow-Up: Your physician recommends that you schedule a follow-up appointment in: 4 wks with Dr [name] or Almyra Deforest, PA-C  Any Other Special Instructions Will Be Listed Below (If Applicable).  If you need a refill on your cardiac medications before your next appointment, please call your pharmacy.    Signed, Erma Heritage, PA-C  08/26/2017 7:49 PM    [facility] [address], [place], [place]  [address] Phone: [phone]; Fax: [phone]  [address] [place], [place] [address] Phone: [phone]

## 2017-08-24 NOTE — Progress Notes (Signed)
Chief Complaint: Hospital follow up  Subjective: This is a 51 year old female with a history of diabetes mellitus type 2, diastolic heart failure, bipolar disorder, iron deficiency anemia, obstructive sleep apnea noncompliant with CPAP, hypertension, morbid obesity, mediastinal adenopathy and Mycobacterium avium complex who was hospitalized 11/06-16, 2018 with shortness of breath. She also was having some chest tightness, dyspnea and orthopnea. Her symptoms have been ongoing for at least one week. On presentation her hemoglobin was 8.1. Her peptide was 250. Her blood pressure was good, afebrile. Her chest x-ray showed some interstitial edema. She was admitted by the internal medicine team for IV Lasix. She had consultation by pulmonary who felt like this was all heart failure. Cardiology was consult as well. She lost overall 18 L of fluid. Her creatinine remained stable. She was started on iron in regards to her iron deficiency anemia. She refused CPAP while in the hospital.  She was discharged home on Bumex 6 mg twice daily. She feels like this is too much medication. Nothing in particular is bothering her she just feels like this is too much medication. No chest pain. Her breathing is back to baseline. She is able to lay flat and sleep at night. No swelling in her legs. No chest pain. She has an upcoming appointment with cardiology.   ROS:  GEN: denies fever or chills, denies change in weight LUNGS: denies SHOB, dyspnea, PND, orthopnea CV: denies CP or palpitations ABD: denies abd pain, N or V EXT: denies muscle spasms or swelling; no pain in lower ext, no weakness  Objective:  Vitals:   08/24/17 1046  BP: 138/77  Pulse: 75  Temp: 98.3 F (36.8 C)  TempSrc: Oral  SpO2: 98%  Weight: (!) 330 lb (149.7 kg)   Physical Exam:  General: in no acute distress.  Heart: Normal  s1 &s2  Regular rate and rhythm, without murmurs, rubs, gallops. Lungs: Clear to auscultation  bilaterally. Extremities: No clubbing cyanosis or edema with positive pedal pulses. Neuro: Alert, awake, oriented x3, nonfocal.    Medications: Prior to Admission medications   Medication Sig Start Date End Date Taking? Authorizing Provider  amLODipine (NORVASC) 10 MG tablet Take 1 tablet (10 mg total) by mouth daily. 03/24/17  Yes Tresa Garter, MD  aspirin EC 81 MG EC tablet Take 1 tablet (81 mg total) daily by mouth. 08/14/17  Yes Barton Dubois, MD  bumetanide (BUMEX) 2 MG tablet Take 3 tablets (6 mg total) 2 (two) times daily by mouth. 08/13/17 08/13/18 Yes Barton Dubois, MD  bumetanide (BUMEX) 2 MG tablet Take 2 tablets (4 mg total) by mouth 2 (two) times daily. 08/24/17  Yes Ena Dawley, Terrilee Dudzik S, PA-C  carvedilol (COREG) 25 MG tablet Take 1 tablet (25 mg total) by mouth 2 (two) times daily. 04/12/17  Yes Lelon Perla, MD  ferrous sulfate 325 (65 FE) MG tablet Take 1 tablet (325 mg total) 3 (three) times daily with meals by mouth. 08/13/17  Yes Barton Dubois, MD  glipiZIDE (GLUCOTROL XL) 10 MG 24 hr tablet Take 1 tablet (10 mg total) by mouth daily with breakfast. 03/24/17  Yes Jegede, Olugbemiga E, MD  glucose blood test strip Use as instructed 01/06/17  Yes Jegede, Olugbemiga E, MD  losartan (COZAAR) 100 MG tablet Take 1 tablet (100 mg total) by mouth daily. 03/24/17  Yes Tresa Garter, MD  metFORMIN (GLUCOPHAGE) 1000 MG tablet Take 1 tablet (1,000 mg total) by mouth 2 (two) times daily with a meal. 03/24/17  Yes Jegede,  Olugbemiga E, MD  nitroGLYCERIN (NITROSTAT) 0.4 MG SL tablet Place 0.4 mg under the tongue every 5 (five) minutes as needed for chest pain.   Yes [provider]  oxybutynin (DITROPAN XL) 15 MG 24 hr tablet TAKE 1 TABLET BY MOUTH AT BEDTIME. 06/03/17  Yes Jegede, Olugbemiga E, MD  potassium chloride (KLOR-CON M15) 15 MEQ tablet Take 2 tablets (30 mEq total) daily by mouth. 08/14/17  Yes Barton Dubois, MD  risperiDONE (RISPERDAL) 2 MG tablet Take 2  tablets (4 mg total) by mouth daily. 08/06/16  Yes Tresa Garter, MD    Assessment: 1. Acute on Chronic dHF Exacerbation/HFpEF 2. Acute on Chronic Anemia, Fe def 3. Morbid Obesity 4. OSA noncompliant with CPAP  Plan: Reduce Bumex 4 mg BID Encouraged use of CPAP, she declines Keep appt with pulm and Cards Low salt diet Cont to work on weight loss BMP, Mag today Cont FeSO4->Upper/lower endoscopies when she is willing  Follow YN:XGZF Dr. Doreene Burke in 2-3 weeks  The patient was given clear instructions to go to ER or return to medical center if symptoms don't improve, worsen or new problems develop. The patient verbalized understanding. The patient was told to call to get lab results if they haven't heard anything in the next week.   This note has been created with Surveyor, quantity. Any transcriptional errors are unintentional.   Zettie Pho, PA-C 08/24/2017, 11:09 AM

## 2017-08-24 NOTE — Progress Notes (Signed)
Pt here for f/u hospital visit and medication concern.

## 2017-08-25 ENCOUNTER — Ambulatory Visit (INDEPENDENT_AMBULATORY_CARE_PROVIDER_SITE_OTHER): Payer: No Typology Code available for payment source | Admitting: Emergency Medicine

## 2017-08-25 ENCOUNTER — Encounter: Payer: Self-pay | Admitting: Emergency Medicine

## 2017-08-25 DIAGNOSIS — G4733 Obstructive sleep apnea (adult) (pediatric): Secondary | ICD-10-CM

## 2017-08-25 LAB — BASIC METABOLIC PANEL
BUN / CREAT RATIO: 26 — AB (ref 9–23)
BUN: 20 mg/dL (ref 6–24)
CO2: 25 mmol/L (ref 20–29)
CREATININE: 0.76 mg/dL (ref 0.57–1.00)
Calcium: 9.6 mg/dL (ref 8.7–10.2)
Chloride: 95 mmol/L — ABNORMAL LOW (ref 96–106)
GFR calc Af Amer: 107 mL/min/{1.73_m2} (ref >59)
GFR, EST NON AFRICAN AMERICAN: 92 mL/min/{1.73_m2} (ref >59)
GLUCOSE: 160 mg/dL — AB (ref 65–99)
Potassium: 4.3 mmol/L (ref 3.5–5.2)
Sodium: 139 mmol/L (ref 134–144)

## 2017-08-25 LAB — MAGNESIUM: MAGNESIUM: 1.6 mg/dL (ref 1.6–2.3)

## 2017-08-25 MED ORDER — HYDROCODONE-ACETAMINOPHEN 7.5-325 MG PO TABS: 1.0000 | ORAL_TABLET | Freq: Four times a day (QID) | ORAL | 0 refills | Status: DC | PRN

## 2017-08-25 NOTE — Assessment & Plan Note (Signed)
Stable by most recent CT chest.

## 2017-08-25 NOTE — Progress Notes (Signed)
Subjective:    Patient ID: [name], female    DOB: 01-16-1966, 51 y.o.   MRN: [id number]  Shortness of Breath  Pertinent negatives include no rhinorrhea.   ROV 07/20/17 --51 year old [place] woman with a history of an abnormal CT scan of the chest that we have been following with serial scans.  She has mediastinal lymphadenopathy and some scattered groundglass changes.  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  She also has obstructive sleep apnea.  In July we discussed restarting CPAP and she did not want to do so. She tells me that she has had more trouble over the last week she has had increased exertional dyspnea. Has orthopnea.  She has needed to miss her lasix because it is difficult for her to work when she takes it - needs to go to the bathroom often. She has had some associated chest tightness, again improved when takes her diuretics. She is concerned that her CBG's have been elevated, followed by Dr Doreene Burke. She feels an upper airway irritation, non-productive cough 1-2x a day.   Her quant gold was negative, ACE normal, HIV negative.   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  Also with obstructive sleep apnea although she has not been able to tolerate CPAP.  At her last visit we discussed better compliance with her furosemide, also discussed better compliance with CPAP.  Unfortunately she was in the emergency department again 82/9/93 with diastolic CHF.  She has been changed to Bumex - she believes that this is an improvement.she is taking more reliably. I performed a CT chest 11/8 > some scattered GGI, likely edema, without bronchiectasis or ILD.    Review of Systems  HENT: Negative for postnasal drip and rhinorrhea.     Respiratory: Negative for cough, chest tightness and shortness of breath.   Psychiatric/Behavioral: Positive for sleep disturbance.   As per HPI     Objective:   Physical Exam Vitals:   08/25/17 1438  BP: 110/76  Pulse: 70  SpO2: 95%  Weight: (!) 333 lb (151 kg)  Height: _0  (1.702 m)    Gen: Pleasant, obese woman, in no distress,  normal affect  ENT: No lesions,  mouth clear,  oropharynx clear, no postnasal drip  Neck: No JVD, no TMG, no carotid bruits  Lungs: No use of accessory muscles, small breaths, no crackles or rhonchi  Cardiovascular: RRR, heart sounds normal, no murmur or gallops, trace peripheral edema  Musculoskeletal: No deformities, no cyanosis or clubbing  Neuro: alert, non focal  Skin: Warm, no lesions or rashes      Assessment & Plan:  OSA (obstructive sleep apnea)- non compliant with C-pap Thus the benefits of CPAP with her in detail especially with regard to her probable pulmonary hypertension and its influence on her diastolic dysfunction, diastolic CHF.  She is willing to retry.  She needs CPAP 19 cm water.  I suspect that starting at such a high pressure will be difficult for her -  I will start her at a lower pressure and try to work our way up to 19  Mediastinal adenopathy Stable by most recent CT chest.   Mycobacterium avium complex ([place]) No indication to treat based on clinical sx or CT scan at this time. Continue to follow.   Herbie Baltimore  Lamonte Sakai, MD, PhD 08/25/2017, 3:11 PM [facility] [phone] or if no answer [phone]

## 2017-08-25 NOTE — Assessment & Plan Note (Signed)
No indication to treat based on clinical sx or CT scan at this time. Continue to follow.

## 2017-08-25 NOTE — Patient Instructions (Signed)
We will try restarting CPAP at 19 cm H2O with nasal pillows, heated humidity.  Follow with Dr Lamonte Sakai in 3 months or sooner if you have any problems.

## 2017-08-25 NOTE — Assessment & Plan Note (Signed)
Thus the benefits of CPAP with her in detail especially with regard to her probable pulmonary hypertension and its influence on her diastolic dysfunction, diastolic CHF.  She is willing to retry.  She needs CPAP 19 cm water.  I suspect that starting at such a high pressure will be difficult for her -  I will start her at a lower pressure and try to work our way up to 19

## 2017-08-26 ENCOUNTER — Encounter: Payer: Self-pay | Admitting: Student

## 2017-08-26 ENCOUNTER — Ambulatory Visit (INDEPENDENT_AMBULATORY_CARE_PROVIDER_SITE_OTHER): Payer: No Typology Code available for payment source | Admitting: Student

## 2017-08-26 VITALS — BP 122/84 | HR 70 | Ht 67.0 in | Wt 336.0 lb

## 2017-08-26 DIAGNOSIS — I5032 Chronic diastolic (congestive) heart failure: Secondary | ICD-10-CM

## 2017-08-26 DIAGNOSIS — G4733 Obstructive sleep apnea (adult) (pediatric): Secondary | ICD-10-CM

## 2017-08-26 DIAGNOSIS — I110 Hypertensive heart disease with heart failure: Secondary | ICD-10-CM

## 2017-08-26 DIAGNOSIS — I10 Essential (primary) hypertension: Secondary | ICD-10-CM

## 2017-08-26 MED ORDER — FUROSEMIDE 80 MG PO TABS: ORAL_TABLET | ORAL | 0 refills | Status: DC

## 2017-08-26 MED ORDER — FUROSEMIDE 80 MG PO TABS: 80.0000 mg | ORAL_TABLET | Freq: Every day | ORAL | 5 refills | Status: DC

## 2017-08-26 NOTE — Patient Instructions (Signed)
Medication Instructions:  STOP Bumex START Lasix Take 80mg  in the morning and 40 mg in the evenings for 5 days THEN Take 1 (80mg ) tablet once a day  Labwork: None   Testing/Procedures: None   Follow-Up: Your physician recommends that you schedule a follow-up appointment in: 4 wks with Dr [name] or Almyra Deforest, PA-C  Any Other Special Instructions Will Be Listed Below (If Applicable).  If you need a refill on your cardiac medications before your next appointment, please call your pharmacy.

## 2017-09-13 ENCOUNTER — Other Ambulatory Visit: Payer: Self-pay | Admitting: Internal Medicine

## 2017-09-13 MED FILL — glipiZIDE XL 10 MG TB24: 10 | 30 days supply | Qty: 30 | Fill #2

## 2017-09-13 MED FILL — ?METFORMIN HCL 1,000 MG TAB: 1000 | 30 days supply | Qty: 60 | Fill #3

## 2017-09-13 MED FILL — FERROUS SULFATE 325 MG TAB: 325 (65 FE) | 30 days supply | Qty: 90 | Fill #1

## 2017-09-13 MED FILL — AMLODIPINE BESYLATE 10 MG T: 10 | 30 days supply | Qty: 30 | Fill #3

## 2017-09-13 MED FILL — LOSARTAN POTASSIUM 100 MG T: 100 | 30 days supply | Qty: 30 | Fill #2

## 2017-09-13 MED FILL — OXYBUTYNIN CL ER 15 MG TAB: 15 | 30 days supply | Qty: 30 | Fill #2

## 2017-09-13 MED FILL — ?FUROSEMIDE 80MG TABLET: 80 | 30 days supply | Qty: 30 | Fill #2

## 2017-09-13 MED FILL — ?CARVEDILOL 25 MG TABLET: 25 | 30 days supply | Qty: 60 | Fill #2

## 2017-09-15 ENCOUNTER — Ambulatory Visit: Payer: No Typology Code available for payment source | Admitting: Internal Medicine

## 2017-09-30 ENCOUNTER — Ambulatory Visit (INDEPENDENT_AMBULATORY_CARE_PROVIDER_SITE_OTHER): Payer: No Typology Code available for payment source | Admitting: Physician Assistant

## 2017-09-30 ENCOUNTER — Encounter: Payer: Self-pay | Admitting: Physician Assistant

## 2017-09-30 VITALS — BP 125/73 | HR 78 | Ht 67.0 in | Wt 335.0 lb

## 2017-09-30 DIAGNOSIS — E119 Type 2 diabetes mellitus without complications: Secondary | ICD-10-CM

## 2017-09-30 DIAGNOSIS — R079 Chest pain, unspecified: Secondary | ICD-10-CM

## 2017-09-30 DIAGNOSIS — I5032 Chronic diastolic (congestive) heart failure: Secondary | ICD-10-CM

## 2017-09-30 DIAGNOSIS — I10 Essential (primary) hypertension: Secondary | ICD-10-CM

## 2017-09-30 DIAGNOSIS — G8929 Other chronic pain: Secondary | ICD-10-CM

## 2017-09-30 DIAGNOSIS — G4733 Obstructive sleep apnea (adult) (pediatric): Secondary | ICD-10-CM

## 2017-09-30 NOTE — Patient Instructions (Addendum)
Lab today ( bmet )   Weigh daily  If you gain 3 lbs over night or 5 lbs within 1 week call office   Drink less than 2 liters per day   Your physician recommends that you schedule a follow-up appointment with Dr.Crenshaw  Tuesday 01/04/18 at 1:20 pm

## 2017-09-30 NOTE — Progress Notes (Signed)
Cardiology Office Note    Date:  10/02/2017   ID:  [name], [name] May 09, 1966, MRN [id number]  PCP:  Tresa Garter, MD  Cardiologist:  Dr. [name]   Chief Complaint  Patient presents with  . Follow-up    no cardiac concerns only cocerned about weight    History of Present Illness:  [name] is a 52 y.o. female with PMH of chronic chest pain, diastolic heart failure, hypertensive heart disease with history of hypertensive urgency, DM II, obstructive sleep apnea and abnormal chest CT with mediastinal adenopathy followed by pulmonology. She had normal Myoview in 2012. She later presented with recurrent chest pain with recommendation for diagnostic cardiac catheterization, however she declined. She has been treated medically since. She was here evaluated by pulmonology in August 2017 secondary to history of abnormal chest CT with mediastinal adenopathy, basilar groundglass infiltrates, and prior Mycobacterium avium infection. She was last seen on 06/16/2016.  I last saw the patient in April 2018, she was seen in March in the ED for 5-day history of chest pain or shortness of breath.  She was noncompliant with her Lasix and had a small amount of lower extremity edema.  She has chronic anemia with hemoglobin at stable level.  Chest x-ray suspected acute pulmonary edema however no pleural effusion.  She continued to have exertional shortness of breath and very mild exertional chest discomfort, however continue to refuse cardiac catheterization.  I increased her carvedilol and also diuretic.  She was recently admitted in November 2018 for evaluation of dyspnea.  She was found to have elevated BNP of 250.  Serial troponin was negative.  She was diuresed with IV Lasix and removal of 17.4 L at the time of discharge.  Her weight is decreased from 347 pounds down to 326 pounds.  She was also started on Bumex 6 mg twice daily at the time of discharge.  By the time she was seen in the cardiology  office in November, her Bumex has been reduced to 4 mg twice daily.  She was restarted on Lasix 80 mg in a.m. and 40 mg in p.m. for 5 days then started taking 80 mg daily.  It was noted during the last office visit that patient was never truly compliant with Lasix previously due to side effect of urination.  Patient presents today for cardiology office visit.  Despite the fact that her current dose of Lasix is a lot weaker than the discharge dose of diuretic, her weight actually dropped by 1 pound.  She says she only missed a few morning dose of diuretic.  On physical exam, she continued to have warty skin coloration seen in lower extremity.  Otherwise her lung is clear, she denies any orthopnea or PND.   Past Medical History:  Diagnosis Date  . Bipolar disease, chronic ([place])   . Chest pain    a. 2012 Myoview: EF 63%, no isch/infarct;  b. 04/2016 Lexiscan MV: EF 73%, no ischemia/infarct-->Low risk.  . CHF (congestive heart failure) ([place])   . Chronic diastolic CHF (congestive heart failure) ([place])    a. 2015 Echo: EF 55-60%, Gr2 DD;  b. 09/2015 Echo: EF 60-65%, no rwma, mod dil LA, PASP 29mmHg.  Marland Kitchen History of thyrotoxicosis   . Hypertensive heart disease 10/18/2013  . Insulin dependent type 2 diabetes mellitus, uncontrolled ([place])   . Mediastinal adenopathy   . MI (myocardial infarction) ([place])   . Morbid obesity with BMI of 50.0-59.9, adult ([place])   . OSA (  obstructive sleep apnea) 03/06/2011    No past surgical history on file.  Current Medications: Outpatient Medications Prior to Visit  Medication Sig Dispense Refill  . amLODipine (NORVASC) 10 MG tablet Take 1 tablet (10 mg total) by mouth daily. 90 tablet 3  . aspirin EC 81 MG EC tablet Take 1 tablet (81 mg total) daily by mouth. 30 tablet 1  . carvedilol (COREG) 25 MG tablet Take 1 tablet (25 mg total) by mouth 2 (two) times daily. 180 tablet 3  . ferrous sulfate 325 (65 FE) MG tablet Take 1 tablet (325 mg total) 3 (three) times daily with  meals by mouth. 90 tablet 2  . furosemide (LASIX) 80 MG tablet Take 1 tablet (80 mg total) by mouth daily. 30 tablet 5  . glipiZIDE (GLUCOTROL XL) 10 MG 24 hr tablet Take 1 tablet (10 mg total) by mouth daily with breakfast. 90 tablet 3  . glucose blood test strip Use as instructed 100 each 12  . losartan (COZAAR) 100 MG tablet Take 1 tablet (100 mg total) by mouth daily. 90 tablet 3  . metFORMIN (GLUCOPHAGE) 1000 MG tablet Take 1 tablet (1,000 mg total) by mouth 2 (two) times daily with a meal. 180 tablet 3  . nitroGLYCERIN (NITROSTAT) 0.4 MG SL tablet Place 0.4 mg under the tongue every 5 (five) minutes as needed for chest pain.    Marland Kitchen oxybutynin (DITROPAN XL) 15 MG 24 hr tablet TAKE 1 TABLET BY MOUTH AT BEDTIME. 30 tablet 3  . potassium chloride (KLOR-CON M15) 15 MEQ tablet Take 2 tablets (30 mEq total) daily by mouth. 60 tablet 1  . risperiDONE (RISPERDAL) 2 MG tablet Take 2 tablets (4 mg total) by mouth daily. 60 tablet 3  . VENTOLIN HFA 108 (90 Base) MCG/ACT inhaler INHALE 1 TO 2 PUFFS EVERY 6 HOURS AS NEEDED FOR WHEEZING/ SHORTNESS OF BREATH 18 g 1   No facility-administered medications prior to visit.      Allergies:   Acetaminophen; Caffeine; Farxiga [dapagliflozin]; and Lisinopril   Social History   Socioeconomic History  . Marital status: Single    Spouse name: None  . Number of children: 0  . Years of education: 12  . Highest education level: None  Social Needs  . Financial resource strain: None  . Food insecurity - worry: None  . Food insecurity - inability: None  . Transportation needs - medical: None  . Transportation needs - non-medical: None  Occupational History  . Occupation: unemployed  Tobacco Use  . Smoking status: Never Smoker  . Smokeless tobacco: Never Used  Substance and Sexual Activity  . Alcohol use: No  . Drug use: No  . Sexual activity: Not Currently    Birth control/protection: None  Other Topics Concern  . 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 But ran out of money and is no longer attending school. (Note patient has bipolar disorder).      Born in [place] but lived in [place] before coming back to [place].       Primary language is Arabic. Lives with mother and brother.              Family History:  The patient's family history includes Heart disease in her maternal grandfather and paternal grandfather; Heart failure in her father; Hypertension in her mother; Stroke in her father.  ROS:   Please see the history of present illness.    ROS All other systems reviewed and are negative.   PHYSICAL EXAM:   VS:  BP 125/73   Pulse 78   Ht 5\' 7"  (1.702 m)   Wt (!) 335 lb (152 kg)   BMI 52.47 kg/m    GEN: Well nourished, well developed, in no acute distress  HEENT: normal  Neck: no JVD, carotid bruits, or masses Cardiac: RRR; no murmurs, rubs, or gallops,no edema  Respiratory:  clear to auscultation bilaterally, normal work of breathing GI: soft, nontender, nondistended, + BS MS: no deformity or atrophy  Skin: warm and dry, no rash Neuro:  Alert and Oriented x 3, Strength and sensation are intact Psych: euthymic mood, full affect  Wt Readings from Last 3 Encounters:  09/30/17 (!) 335 lb (152 kg)  08/26/17 (!) 336 lb (152.4 kg)  08/25/17 (!) 333 lb (151 kg)      Studies/Labs Reviewed:   EKG:  EKG is not ordered today.   Recent Labs: 08/03/2017: ALT 11; B Natriuretic Peptide 250.5 08/04/2017: TSH 4.181 08/11/2017: Hemoglobin 9.0; Platelets 235 08/24/2017: Magnesium 1.6 09/30/2017: BUN 14; Creatinine, Ser 0.87; Potassium 4.2; Sodium 136   Lipid Panel    Component Value Date/Time   CHOL 173 08/06/2016 1245   TRIG 212 (H) 08/06/2016 1245   HDL 45 (L) 08/06/2016 1245   CHOLHDL 3.8 08/06/2016 1245   VLDL 42 (H) 08/06/2016 1245   LDLCALC 86 08/06/2016 1245    Additional studies/ records that were reviewed  today include:   Echo 08/06/2017 LV EF: 55% -   60%  ------------------------------------------------------------------- Indications:      Abnormal EKG 794.31.  ------------------------------------------------------------------- History:   PMH:  Morbid obesity, MI, IDDM, Hypertension, CHF, pulmonary hypertension.  ------------------------------------------------------------------- Study Conclusions  - Left ventricle: The cavity size was normal. Wall thickness was   increased in a pattern of moderate LVH. Systolic function was   normal. The estimated ejection fraction was in the range of 55%   to 60%. Wall motion was normal; there were no regional wall   motion abnormalities. Features are consistent with a pseudonormal   left ventricular filling pattern, with concomitant abnormal   relaxation and increased filling pressure (grade 2 diastolic   dysfunction). - Mitral valve: Moderately to severely calcified annulus. Valve   area by continuity equation (using LVOT flow): 1.8 cm^2. - Left atrium: The atrium was moderately dilated. - Right atrium: The atrium was mildly to moderately dilated. - Tricuspid valve: There was mild-moderate regurgitation. - Pulmonary arteries: Systolic pressure was moderately increased.   PA peak pressure: 58 mm Hg (S).    ASSESSMENT:    1. Chronic diastolic heart failure ([place])   2. Benign essential HTN   3. Chronic chest pain   4. Controlled type 2 diabetes mellitus without complication, without long-term current use of insulin ([place])   5. Essential hypertension   6. OSA (obstructive sleep apnea)      PLAN:  In order of problems listed above:  1. Chronic diastolic heart failure: She was previously discharged on 8 mg twice daily of Bumex.  Since discharge, she was never truly compliant with her medication.  During the last office visit, her Lasix was decreased to 80 mg daily.  She has been trying to be more compliant with the current medication,  although she did miss a few days.  Her weight dropped by 1 more pounds since last office visit.  I will continue  on the current medication, she has been instructed to contact cardiology if her weight increases by more than 3 pounds overnight or 5 pounds in a single week.  Compliance is a key in this case.  2. Chronic chest pain: She occasionally still has chest pain about once week, she is not interested in any cardiac catheterization.  This issue has been chronic and a cardiac catheterization was offered in the past and it was turned down.  Her current chest pain seems to be quite stable  3. Hypertension: Blood pressure stable on current medication  4. DM 2: Managed by primary care provider  5. Obstructive sleep apnea: Not very well compliant with CPAP either, I did encourage better compliance    Medication Adjustments/Labs and Tests Ordered: Current medicines are reviewed at length with the patient today.  Concerns regarding medicines are outlined above.  Medication changes, Labs and Tests ordered today are listed in the Patient Instructions below. Patient Instructions  Lab today ( bmet )   Weigh daily  If you gain 3 lbs over night or 5 lbs within 1 week call office   Drink less than 2 liters per day   Your physician recommends that you schedule a follow-up appointment with Dr.Crenshaw  Tuesday 01/04/18 at 1:20 pm    Signed, Almyra Deforest, [place]  10/02/2017 3:00 PM    [address], [place], [place]  [address] Phone: [phone]; Fax: [phone]

## 2017-10-01 LAB — BASIC METABOLIC PANEL
BUN/Creatinine Ratio: 16 (ref 9–23)
BUN: 14 mg/dL (ref 6–24)
CALCIUM: 9.2 mg/dL (ref 8.7–10.2)
CHLORIDE: 95 mmol/L — AB (ref 96–106)
CO2: 24 mmol/L (ref 20–29)
Creatinine, Ser: 0.87 mg/dL (ref 0.57–1.00)
GFR calc Af Amer: 90 mL/min/{1.73_m2} (ref >59)
GFR, EST NON AFRICAN AMERICAN: 78 mL/min/{1.73_m2} (ref >59)
GLUCOSE: 330 mg/dL — AB (ref 65–99)
POTASSIUM: 4.2 mmol/L (ref 3.5–5.2)
Sodium: 136 mmol/L (ref 134–144)

## 2017-10-01 NOTE — Progress Notes (Signed)
Kidney function and electrolyte stable.

## 2017-10-02 ENCOUNTER — Encounter: Payer: Self-pay | Admitting: Physician Assistant

## 2017-10-06 ENCOUNTER — Ambulatory Visit: Payer: Self-pay | Attending: Internal Medicine | Admitting: Internal Medicine

## 2017-10-06 ENCOUNTER — Encounter: Payer: Self-pay | Admitting: Internal Medicine

## 2017-10-06 VITALS — BP 149/89 | HR 70 | Temp 98.6°F | Resp 18 | Ht 67.0 in | Wt 335.0 lb

## 2017-10-06 DIAGNOSIS — A319 Mycobacterial infection, unspecified: Secondary | ICD-10-CM | POA: Insufficient documentation

## 2017-10-06 DIAGNOSIS — I110 Hypertensive heart disease with heart failure: Secondary | ICD-10-CM | POA: Insufficient documentation

## 2017-10-06 DIAGNOSIS — E1165 Type 2 diabetes mellitus with hyperglycemia: Secondary | ICD-10-CM | POA: Insufficient documentation

## 2017-10-06 DIAGNOSIS — I5032 Chronic diastolic (congestive) heart failure: Secondary | ICD-10-CM | POA: Insufficient documentation

## 2017-10-06 DIAGNOSIS — Z9119 Patient's noncompliance with other medical treatment and regimen: Secondary | ICD-10-CM | POA: Insufficient documentation

## 2017-10-06 DIAGNOSIS — F319 Bipolar disorder, unspecified: Secondary | ICD-10-CM | POA: Insufficient documentation

## 2017-10-06 DIAGNOSIS — I252 Old myocardial infarction: Secondary | ICD-10-CM | POA: Insufficient documentation

## 2017-10-06 DIAGNOSIS — D509 Iron deficiency anemia, unspecified: Secondary | ICD-10-CM | POA: Insufficient documentation

## 2017-10-06 DIAGNOSIS — R0602 Shortness of breath: Secondary | ICD-10-CM | POA: Insufficient documentation

## 2017-10-06 DIAGNOSIS — Z794 Long term (current) use of insulin: Secondary | ICD-10-CM | POA: Insufficient documentation

## 2017-10-06 DIAGNOSIS — R590 Localized enlarged lymph nodes: Secondary | ICD-10-CM | POA: Insufficient documentation

## 2017-10-06 DIAGNOSIS — Z23 Encounter for immunization: Secondary | ICD-10-CM

## 2017-10-06 DIAGNOSIS — I10 Essential (primary) hypertension: Secondary | ICD-10-CM

## 2017-10-06 DIAGNOSIS — E0590 Thyrotoxicosis, unspecified without thyrotoxic crisis or storm: Secondary | ICD-10-CM | POA: Insufficient documentation

## 2017-10-06 DIAGNOSIS — Z7982 Long term (current) use of aspirin: Secondary | ICD-10-CM | POA: Insufficient documentation

## 2017-10-06 DIAGNOSIS — G4733 Obstructive sleep apnea (adult) (pediatric): Secondary | ICD-10-CM | POA: Insufficient documentation

## 2017-10-06 DIAGNOSIS — Z79899 Other long term (current) drug therapy: Secondary | ICD-10-CM | POA: Insufficient documentation

## 2017-10-06 DIAGNOSIS — Z888 Allergy status to other drugs, medicaments and biological substances status: Secondary | ICD-10-CM | POA: Insufficient documentation

## 2017-10-06 LAB — GLUCOSE, POCT (MANUAL RESULT ENTRY): POC Glucose: 172 mg/dl — AB (ref 70–99)

## 2017-10-06 LAB — POCT GLYCOSYLATED HEMOGLOBIN (HGB A1C): HEMOGLOBIN A1C: 8.6

## 2017-10-06 NOTE — Patient Instructions (Signed)
Diabetes Mellitus and Nutrition When you have diabetes (diabetes mellitus), it is very important to have healthy eating habits because your blood sugar (glucose) levels are greatly affected by what you eat and drink. Eating healthy foods in the appropriate amounts, at about the same times every day, can help you:  Control your blood glucose.  Lower your risk of heart disease.  Improve your blood pressure.  Reach or maintain a healthy weight.  Every person with diabetes is different, and each person has different needs for a meal plan. Your health care provider may recommend that you work with a diet and nutrition specialist (dietitian) to make a meal plan that is best for you. Your meal plan may vary depending on factors such as:  The calories you need.  The medicines you take.  Your weight.  Your blood glucose, blood pressure, and cholesterol levels.  Your activity level.  Other health conditions you have, such as heart or kidney disease.  How do carbohydrates affect me? Carbohydrates affect your blood glucose level more than any other type of food. Eating carbohydrates naturally increases the amount of glucose in your blood. Carbohydrate counting is a method for keeping track of how many carbohydrates you eat. Counting carbohydrates is important to keep your blood glucose at a healthy level, especially if you use insulin or take certain oral diabetes medicines. It is important to know how many carbohydrates you can safely have in each meal. This is different for every person. Your dietitian can help you calculate how many carbohydrates you should have at each meal and for snack. Foods that contain carbohydrates include:  Bread, cereal, rice, pasta, and crackers.  Potatoes and corn.  Peas, beans, and lentils.  Milk and yogurt.  Fruit and juice.  Desserts, such as cakes, cookies, ice cream, and candy.  How does alcohol affect me? Alcohol can cause a sudden decrease in blood  glucose (hypoglycemia), especially if you use insulin or take certain oral diabetes medicines. Hypoglycemia can be a life-threatening condition. Symptoms of hypoglycemia (sleepiness, dizziness, and confusion) are similar to symptoms of having too much alcohol. If your health care provider says that alcohol is safe for you, follow these guidelines:  Limit alcohol intake to no more than 1 drink per day for nonpregnant women and 2 drinks per day for men. One drink equals 12 oz of beer, 5 oz of wine, or 1 oz of hard liquor.  Do not drink on an empty stomach.  Keep yourself hydrated with water, diet soda, or unsweetened iced tea.  Keep in mind that regular soda, juice, and other mixers may contain a lot of sugar and must be counted as carbohydrates.  What are tips for following this plan? Reading food labels  Start by checking the serving size on the label. The amount of calories, carbohydrates, fats, and other nutrients listed on the label are based on one serving of the food. Many foods contain more than one serving per package.  Check the total grams (g) of carbohydrates in one serving. You can calculate the number of servings of carbohydrates in one serving by dividing the total carbohydrates by 15. For example, if a food has 30 g of total carbohydrates, it would be equal to 2 servings of carbohydrates.  Check the number of grams (g) of saturated and trans fats in one serving. Choose foods that have low or no amount of these fats.  Check the number of milligrams (mg) of sodium in one serving. Most people   should limit total sodium intake to less than 2,300 mg per day.  Always check the nutrition information of foods labeled as "low-fat" or "nonfat". These foods may be higher in added sugar or refined carbohydrates and should be avoided.  Talk to your dietitian to identify your daily goals for nutrients listed on the label. Shopping  Avoid buying canned, premade, or processed foods. These  foods tend to be high in fat, sodium, and added sugar.  Shop around the outside edge of the grocery store. This includes fresh fruits and vegetables, bulk grains, fresh meats, and fresh dairy. Cooking  Use low-heat cooking methods, such as baking, instead of high-heat cooking methods like deep frying.  Cook using healthy oils, such as olive, canola, or sunflower oil.  Avoid cooking with butter, cream, or high-fat meats. Meal planning  Eat meals and snacks regularly, preferably at the same times every day. Avoid going long periods of time without eating.  Eat foods high in fiber, such as fresh fruits, vegetables, beans, and whole grains. Talk to your dietitian about how many servings of carbohydrates you can eat at each meal.  Eat 4-6 ounces of lean protein each day, such as lean meat, chicken, fish, eggs, or tofu. 1 ounce is equal to 1 ounce of meat, chicken, or fish, 1 egg, or 1/4 cup of tofu.  Eat some foods each day that contain healthy fats, such as avocado, nuts, seeds, and fish. Lifestyle   Check your blood glucose regularly.  Exercise at least 30 minutes 5 or more days each week, or as told by your health care provider.  Take medicines as told by your health care provider.  Do not use any products that contain nicotine or tobacco, such as cigarettes and e-cigarettes. If you need help quitting, ask your health care provider.  Work with a Social worker or diabetes educator to identify strategies to manage stress and any emotional and social challenges. What are some questions to ask my health care provider?  Do I need to meet with a diabetes educator?  Do I need to meet with a dietitian?  What number can I call if I have questions?  When are the best times to check my blood glucose? Where to find more information:  American Diabetes Association: diabetes.org/food-and-fitness/food  Academy of Nutrition and Dietetics:  PokerClues.dk  [employer] of Diabetes and Digestive and Kidney Diseases (NIH): ContactWire.be Summary  A healthy meal plan will help you control your blood glucose and maintain a healthy lifestyle.  Working with a diet and nutrition specialist (dietitian) can help you make a meal plan that is best for you.  Keep in mind that carbohydrates and alcohol have immediate effects on your blood glucose levels. It is important to count carbohydrates and to use alcohol carefully. This information is not intended to replace advice given to you by your health care provider. Make sure you discuss any questions you have with your health care provider. Document Released: 06/11/2005 Document Revised: 10/19/2016 Document Reviewed: 10/19/2016 Elsevier Interactive Patient Education  2018 [employer]. Diabetes Mellitus and Exercise Exercising regularly is important for your overall health, especially when you have diabetes (diabetes mellitus). Exercising is not only about losing weight. It has many health benefits, such as increasing muscle strength and bone density and reducing body fat and stress. This leads to improved fitness, flexibility, and endurance, all of which result in better overall health. Exercise has additional benefits for people with diabetes, including:  Reducing appetite.  Helping to lower  and control blood glucose.  Lowering blood pressure.  Helping to control amounts of fatty substances (lipids) in the blood, such as cholesterol and triglycerides.  Helping the body to respond better to insulin (improving insulin sensitivity).  Reducing how much insulin the body needs.  Decreasing the risk for heart disease by: ? Lowering cholesterol and triglyceride levels. ? Increasing the levels of good cholesterol. ? Lowering blood glucose levels.  What is my  activity plan? Your health care provider or certified diabetes educator can help you make a plan for the type and frequency of exercise (activity plan) that works for you. Make sure that you:  Do at least 150 minutes of moderate-intensity or vigorous-intensity exercise each week. This could be brisk walking, biking, or water aerobics. ? Do stretching and strength exercises, such as yoga or weightlifting, at least 2 times a week. ? Spread out your activity over at least 3 days of the week.  Get some form of physical activity every day. ? Do not go more than 2 days in a row without some kind of physical activity. ? Avoid being inactive for more than 90 minutes at a time. Take frequent breaks to walk or stretch.  Choose a type of exercise or activity that you enjoy, and set realistic goals.  Start slowly, and gradually increase the intensity of your exercise over time.  What do I need to know about managing my diabetes?  Check your blood glucose before and after exercising. ? If your blood glucose is higher than 240 mg/dL (13.3 mmol/L) before you exercise, check your urine for ketones. If you have ketones in your urine, do not exercise until your blood glucose returns to normal.  Know the symptoms of low blood glucose (hypoglycemia) and how to treat it. Your risk for hypoglycemia increases during and after exercise. Common symptoms of hypoglycemia can include: ? Hunger. ? Anxiety. ? Sweating and feeling clammy. ? Confusion. ? Dizziness or feeling light-headed. ? Increased heart rate or palpitations. ? Blurry vision. ? Tingling or numbness around the mouth, lips, or tongue. ? Tremors or shakes. ? Irritability.  Keep a rapid-acting carbohydrate snack available before, during, and after exercise to help prevent or treat hypoglycemia.  Avoid injecting insulin into areas of the body that are going to be exercised. For example, avoid injecting insulin into: ? The arms, when playing  tennis. ? The legs, when jogging.  Keep records of your exercise habits. Doing this can help you and your health care provider adjust your diabetes management plan as needed. Write down: ? Food that you eat before and after you exercise. ? Blood glucose levels before and after you exercise. ? The type and amount of exercise you have done. ? When your insulin is expected to peak, if you use insulin. Avoid exercising at times when your insulin is peaking.  When you start a new exercise or activity, work with your health care provider to make sure the activity is safe for you, and to adjust your insulin, medicines, or food intake as needed.  Drink plenty of water while you exercise to prevent dehydration or heat stroke. Drink enough fluid to keep your urine clear or pale yellow. This information is not intended to replace advice given to you by your health care provider. Make sure you discuss any questions you have with your health care provider. Document Released: 12/05/2003 Document Revised: 04/03/2016 Document Reviewed: 02/24/2016 Elsevier Interactive Patient Education  2018 [employer]. Hypertension Hypertension is another name for  high blood pressure. High blood pressure forces your heart to work harder to pump blood. This can cause problems over time. There are two numbers in a blood pressure reading. There is a top number (systolic) over a bottom number (diastolic). It is best to have a blood pressure below 120/80. Healthy choices can help lower your blood pressure. You may need medicine to help lower your blood pressure if:  Your blood pressure cannot be lowered with healthy choices.  Your blood pressure is higher than 130/80.  Follow these instructions at home: Eating and drinking  If directed, follow the DASH eating plan. This diet includes: ? Filling half of your plate at each meal with fruits and vegetables. ? Filling one quarter of your plate at each meal with whole grains.  Whole grains include whole wheat pasta, brown rice, and whole grain bread. ? Eating or drinking low-fat dairy products, such as skim milk or low-fat yogurt. ? Filling one quarter of your plate at each meal with low-fat (lean) proteins. Low-fat proteins include fish, skinless chicken, eggs, beans, and tofu. ? Avoiding fatty meat, cured and processed meat, or chicken with skin. ? Avoiding premade or processed food.  Eat less than 1,500 mg of salt (sodium) a day.  Limit alcohol use to no more than 1 drink a day for nonpregnant women and 2 drinks a day for men. One drink equals 12 oz of beer, 5 oz of wine, or 1 oz of hard liquor. Lifestyle  Work with your doctor to stay at a healthy weight or to lose weight. Ask your doctor what the best weight is for you.  Get at least 30 minutes of exercise that causes your heart to beat faster (aerobic exercise) most days of the week. This may include walking, swimming, or biking.  Get at least 30 minutes of exercise that strengthens your muscles (resistance exercise) at least 3 days a week. This may include lifting weights or pilates.  Do not use any products that contain nicotine or tobacco. This includes cigarettes and e-cigarettes. If you need help quitting, ask your doctor.  Check your blood pressure at home as told by your doctor.  Keep all follow-up visits as told by your doctor. This is important. Medicines  Take over-the-counter and prescription medicines only as told by your doctor. Follow directions carefully.  Do not skip doses of blood pressure medicine. The medicine does not work as well if you skip doses. Skipping doses also puts you at risk for problems.  Ask your doctor about side effects or reactions to medicines that you should watch for. Contact a doctor if:  You think you are having a reaction to the medicine you are taking.  You have headaches that keep coming back (recurring).  You feel dizzy.  You have swelling in your  ankles.  You have trouble with your vision. Get help right away if:  You get a very bad headache.  You start to feel confused.  You feel weak or numb.  You feel faint.  You get very bad pain in your: ? Chest. ? Belly (abdomen).  You throw up (vomit) more than once.  You have trouble breathing. Summary  Hypertension is another name for high blood pressure.  Making healthy choices can help lower blood pressure. If your blood pressure cannot be controlled with healthy choices, you may need to take medicine. This information is not intended to replace advice given to you by your health care provider. Make sure you discuss  any questions you have with your health care provider. Document Released: 03/02/2008 Document Revised: 08/12/2016 Document Reviewed: 08/12/2016 Elsevier Interactive Patient Education  2018 [place]. Heart Failure Action Plan A heart failure action plan helps you understand what to do when you have symptoms of heart failure. Follow the plan that was created by you and your health care provider. Review your plan each time you visit your health care provider. Red zone These signs and symptoms mean you should get medical help right away:  You have trouble breathing when resting.  You have a dry cough that is getting worse.  You have swelling or pain in your legs or abdomen that is getting worse.  You suddenly gain more than 2-3 lb (0.9-1.4 kg) in a day, or more than 5 lb (2.3 kg) in one week. This amount may be more or less depending on your condition.  You have trouble staying awake or you feel confused.  You have chest pain.  You do not have an appetite.  You pass out.  If you experience any of these symptoms:  Call your local emergency services (911 in the [place] right away or seek help at the emergency department of the nearest hospital.  Yellow zone These signs and symptoms mean your condition may be getting worse and you should make some  changes:  You have trouble breathing when you are active or you need to sleep with extra pillows.  You have swelling in your legs or abdomen.  You gain 2-3 lb (0.9-1.4 kg) in one day, or 5 lb (2.3 kg) in one week. This amount may be more or less depending on your condition.  You get tired easily.  You have trouble sleeping.  You have a dry cough.  If you experience any of these symptoms:  Contact your health care provider within the next day.  Your health care provider may adjust your medicines.  Green zone These signs mean you are doing well and can continue what you are doing:  You do not have shortness of breath.  You have very little swelling or no new swelling.  Your weight is stable (no gain or loss).  You have a normal activity level.  You do not have chest pain or any other new symptoms.  Follow these instructions at home:  Take over-the-counter and prescription medicines only as told by your health care provider.  Weigh yourself daily. Your target weight is __________ lb (__________ kg). ? Call your health care provider if you gain more than __________ lb (__________ kg) in a day, or more than __________ lb (__________ kg) in one week.  Eat a heart-healthy diet. Work with a diet and nutrition specialist (dietitian) to create an eating plan that is best for you.  Keep all follow-up visits as told by your health care provider. This is important. Where to find more information:  American Heart Association: www.heart.org Summary  Follow the action plan that was created by you and your health care provider.  Get help right away if you have any symptoms in the Red zone. This information is not intended to replace advice given to you by your health care provider. Make sure you discuss any questions you have with your health care provider. Document Released: 10/24/2016 Document Revised: 10/24/2016 Document Reviewed: 10/24/2016 Elsevier Interactive Patient  Education  [employer].

## 2017-10-06 NOTE — Progress Notes (Signed)
[name], is a 52 y.o. female  ZSM:[id number]  GBE:[id number]  DOB - 11/22/1965  Chief Complaint  Patient presents with  . Diabetes      Subjective:   [name] is a 52 y.o. female with a history of diabetes mellitus type 2, diastolic heart failure, bipolar disorder, iron deficiency anemia, obstructive sleep apnea noncompliant with CPAP, hypertension, morbid obesity, mediastinal adenopathy and Mycobacterium avium complex who was hospitalized 11/06-16, 2018 with shortness of breath.   She presents here today for a routine follow up visit for diabetes and hypertension. She has ne new complaint. She is very proud of herself as she is now more adherent with her medications and she is slowly getting to an exercise routine. She finished her CMA course and now looking for job. She denies any SOB or orthopnea. She follows up regularly with cardiologists and pulmonologist. She however still does not use her CPAP as directed. Patient has No headache, No chest pain, No abdominal pain - No Nausea, No new weakness tingling or numbness, No Cough. Patient is currently being evaluated for bariatric surgery and she has questions about possible side effects and/complications long term. She does not need any medication refills today.  Problem  Morbid Obesity Due to Excess Calories (Hcc)   ALLERGIES: Allergies  Allergen Reactions  . Acetaminophen     Fits as a child "seizures-like"  . Caffeine     Tense, anxiety, increased urination  . [place] [Dapagliflozin] Other (See Comments)    Hallucinations, drop in blood sugar.   . Lisinopril Rash    Rash with lisinopril; but fosinopril is ok per patient    PAST MEDICAL HISTORY: Past Medical History:  Diagnosis Date  . Bipolar disease, chronic ([place])   . Chest pain    a. 2012 Myoview: EF 63%, no isch/infarct;  b. 04/2016 Lexiscan MV: EF 73%, no ischemia/infarct-->Low risk.  . CHF (congestive heart failure) ([place])   . Chronic diastolic CHF  (congestive heart failure) ([place])    a. 2015 Echo: EF 55-60%, Gr2 DD;  b. 09/2015 Echo: EF 60-65%, no rwma, mod dil LA, PASP 82mHg.  .Marland KitchenHistory of thyrotoxicosis   . Hypertensive heart disease 10/18/2013  . Insulin dependent type 2 diabetes mellitus, uncontrolled ([place]   . Mediastinal adenopathy   . [place]   . Morbid obesity with BMI of 50.0-59.9, adult ([place]   . OSA (obstructive sleep apnea) 03/06/2011    MEDICATIONS AT HOME: Prior to Admission medications   Medication Sig Start Date End Date Taking? Authorizing Provider  amLODipine (NORVASC) 10 MG tablet Take 1 tablet (10 mg total) by mouth daily. 03/24/17   JTresa Garter MD  aspirin EC 81 MG EC tablet Take 1 tablet (81 mg total) daily by mouth. 08/14/17   MBarton Dubois MD  carvedilol (COREG) 25 MG tablet Take 1 tablet (25 mg total) by mouth 2 (two) times daily. 04/12/17   CLelon Perla MD  ferrous sulfate 325 (65 FE) MG tablet Take 1 tablet (325 mg total) 3 (three) times daily with meals by mouth. 08/13/17   MBarton Dubois MD  furosemide (LASIX) 80 MG tablet Take 1 tablet (80 mg total) by mouth daily. 08/26/17   Strader, BFransisco Hertz PA-C  glipiZIDE (GLUCOTROL XL) 10 MG 24 hr tablet Take 1 tablet (10 mg total) by mouth daily with breakfast. 03/24/17   JAngelica ChessmanE, MD  glucose blood test strip Use as instructed 01/06/17   JTresa Garter MD  losartan (COZAAR) 100 MG tablet Take 1 tablet (100 mg total) by mouth daily. 03/24/17   Tresa Garter, MD  metFORMIN (GLUCOPHAGE) 1000 MG tablet Take 1 tablet (1,000 mg total) by mouth 2 (two) times daily with a meal. 03/24/17   Offie Waide, Marlena Clipper, MD  nitroGLYCERIN (NITROSTAT) 0.4 MG SL tablet Place 0.4 mg under the tongue every 5 (five) minutes as needed for chest pain.    [provider]  oxybutynin (DITROPAN XL) 15 MG 24 hr tablet TAKE 1 TABLET BY MOUTH AT BEDTIME. 06/03/17   Tresa Garter, MD  potassium chloride (KLOR-CON M15) 15  MEQ tablet Take 2 tablets (30 mEq total) daily by mouth. 08/14/17   Barton Dubois, MD  risperiDONE (RISPERDAL) 2 MG tablet Take 2 tablets (4 mg total) by mouth daily. 08/06/16   Tresa Garter, MD  VENTOLIN HFA 108 (90 Base) MCG/ACT inhaler INHALE 1 TO 2 PUFFS EVERY 6 HOURS AS NEEDED FOR WHEEZING/ SHORTNESS OF BREATH 09/13/17   Tresa Garter, MD    Objective:   Vitals:   10/06/17 1529  BP: (!) 149/89  Pulse: 70  Resp: 18  Temp: 98.6 F (37 C)  TempSrc: Oral  SpO2: 91%  Weight: (!) 335 lb (152 kg)  Height: 5' 7"  (1.702 m)   Exam General appearance : Awake, alert, not in any distress. Speech Clear. Not toxic looking, morbidly obese HEENT: Atraumatic and Normocephalic, pupils equally reactive to light and accomodation Neck: Supple, no JVD. No cervical lymphadenopathy.  Chest: Good air entry bilaterally, no added sounds  CVS: S1 S2 regular, no murmurs.  Abdomen: Bowel sounds present, Non tender and not distended with no gaurding, rigidity or rebound. Extremities: B/L Lower Ext shows no edema, both legs are warm to touch Neurology: Awake alert, and oriented X 3, CN II-XII intact, Non focal Skin: No Rash  Data Review Lab Results  Component Value Date   HGBA1C 9.7 (H) 08/06/2017   HGBA1C 10.2 03/24/2017   HGBA1C 9.9 08/06/2016    Assessment & Plan   1. Uncontrolled type 2 diabetes mellitus with hyperglycemia (HCC)  - HgB A1c today 8.6% today, down from 9.7% - Patient better adherent with diet but no exercise - She still drink soda occasionally - Glucose (CBG)  Aim for 30 minutes of exercise most days. Rethink what you drink. Water is great! Aim for 2-3 Carb Choices per meal (30-45 grams) +/- 1 either way  Aim for 0-15 Carbs per snack if hungry  Include protein in moderation with your meals and snacks  Consider reading food labels for Total Carbohydrate and Fat Grams of foods  Consider checking BG at alternate times per day  Continue taking medication as  directed Be mindful about how much sugar you are adding to beverages and other foods. Fruit Punch - find one with no sugar  Measure and decrease portions of carbohydrate foods  Make your plate and don't go back for seconds  2. Essential hypertension  We have discussed target BP range and blood pressure goal. I have advised patient to check BP regularly and to call us back or report to clinic if the numbers are consistently higher than 140/90. We discussed the importance of compliance with medical therapy and DASH diet recommended, consequences of uncontrolled hypertension discussed.  - continue current BP medications  3. Morbid obesity due to excess calories [facility])  - Patient being evaluated for Bariatric Surgery - Questions answered, concerns addressed - She will schedule   4. Chronic  diastolic heart failure [facility])  - Patient saw cardiologist last week, medications continued - Weight has been relatively stable - Continue current medications, continue follow up with Cardiologist  Patient have been counseled extensively about nutrition and exercise. Other issues discussed during this visit include: low cholesterol diet, weight control and daily exercise, foot care, annual eye examinations at Ophthalmology, importance of adherence with medications and regular follow-up. We also discussed long term complications of uncontrolled diabetes and hypertension.   Return in about 3 months (around 01/04/2018) for Hemoglobin A1C and Follow up, DM, Heart Failure and Hypertension.  The patient was given clear instructions to go to ER or return to medical center if symptoms don't improve, worsen or new problems develop. The patient verbalized understanding. The patient was told to call to get lab results if they haven't heard anything in the next week.   This note has been created with Surveyor, quantity. Any transcriptional errors are unintentional.     [name], MD, MHA, Karilyn Cota, [place] [facility] [place], [place]   10/06/2017, 4:09 PM

## 2017-10-19 IMAGING — DX DG CHEST 2V
2 series · 2 of 2 positions shown · non-contrast
Comparison: 11/02/2013

CLINICAL DATA: Productive cough, shortness of breath and left-sided
chest pain for 2 weeks. Progressive symptoms.

EXAM:
CHEST  2 VIEW

[chest pa]
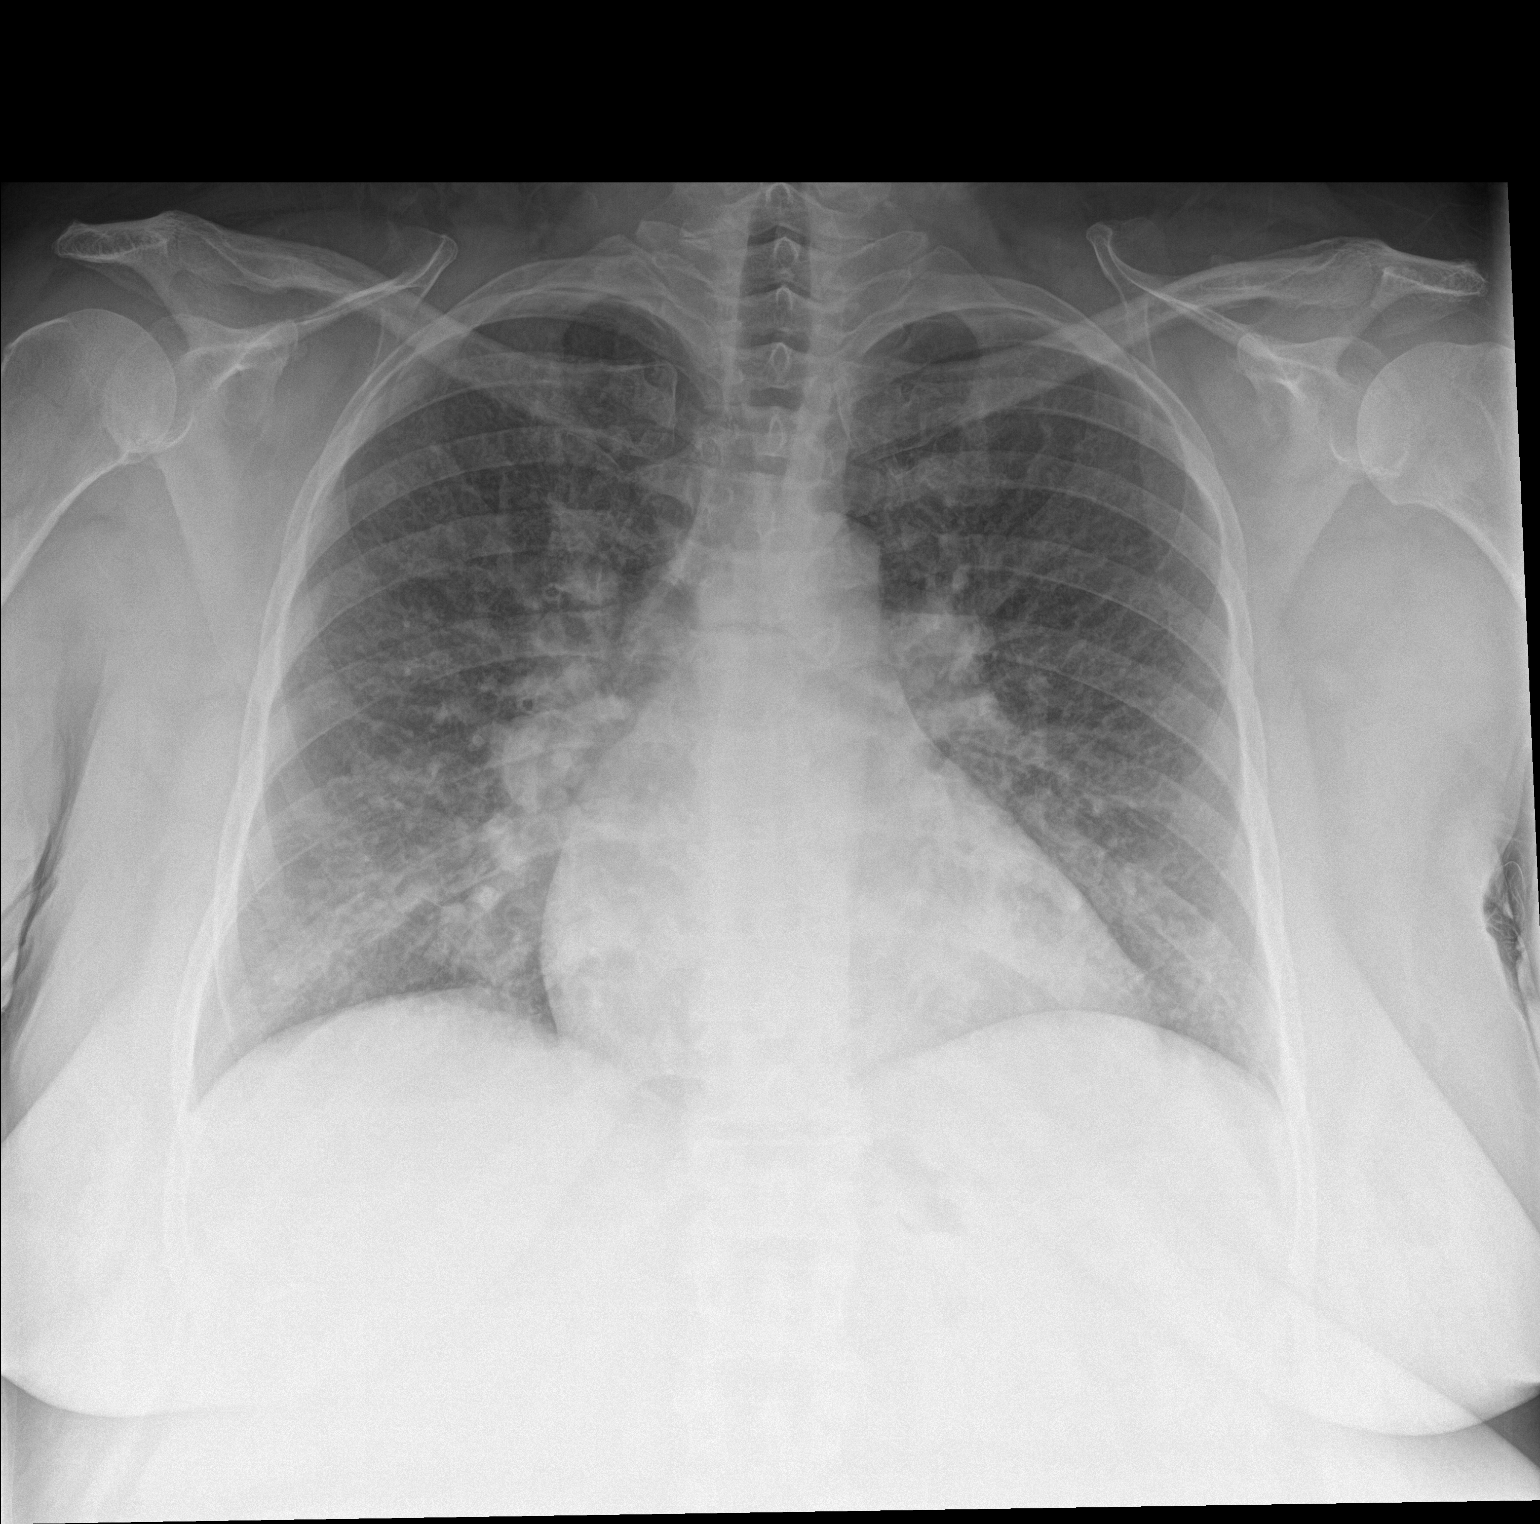

[chest lat]
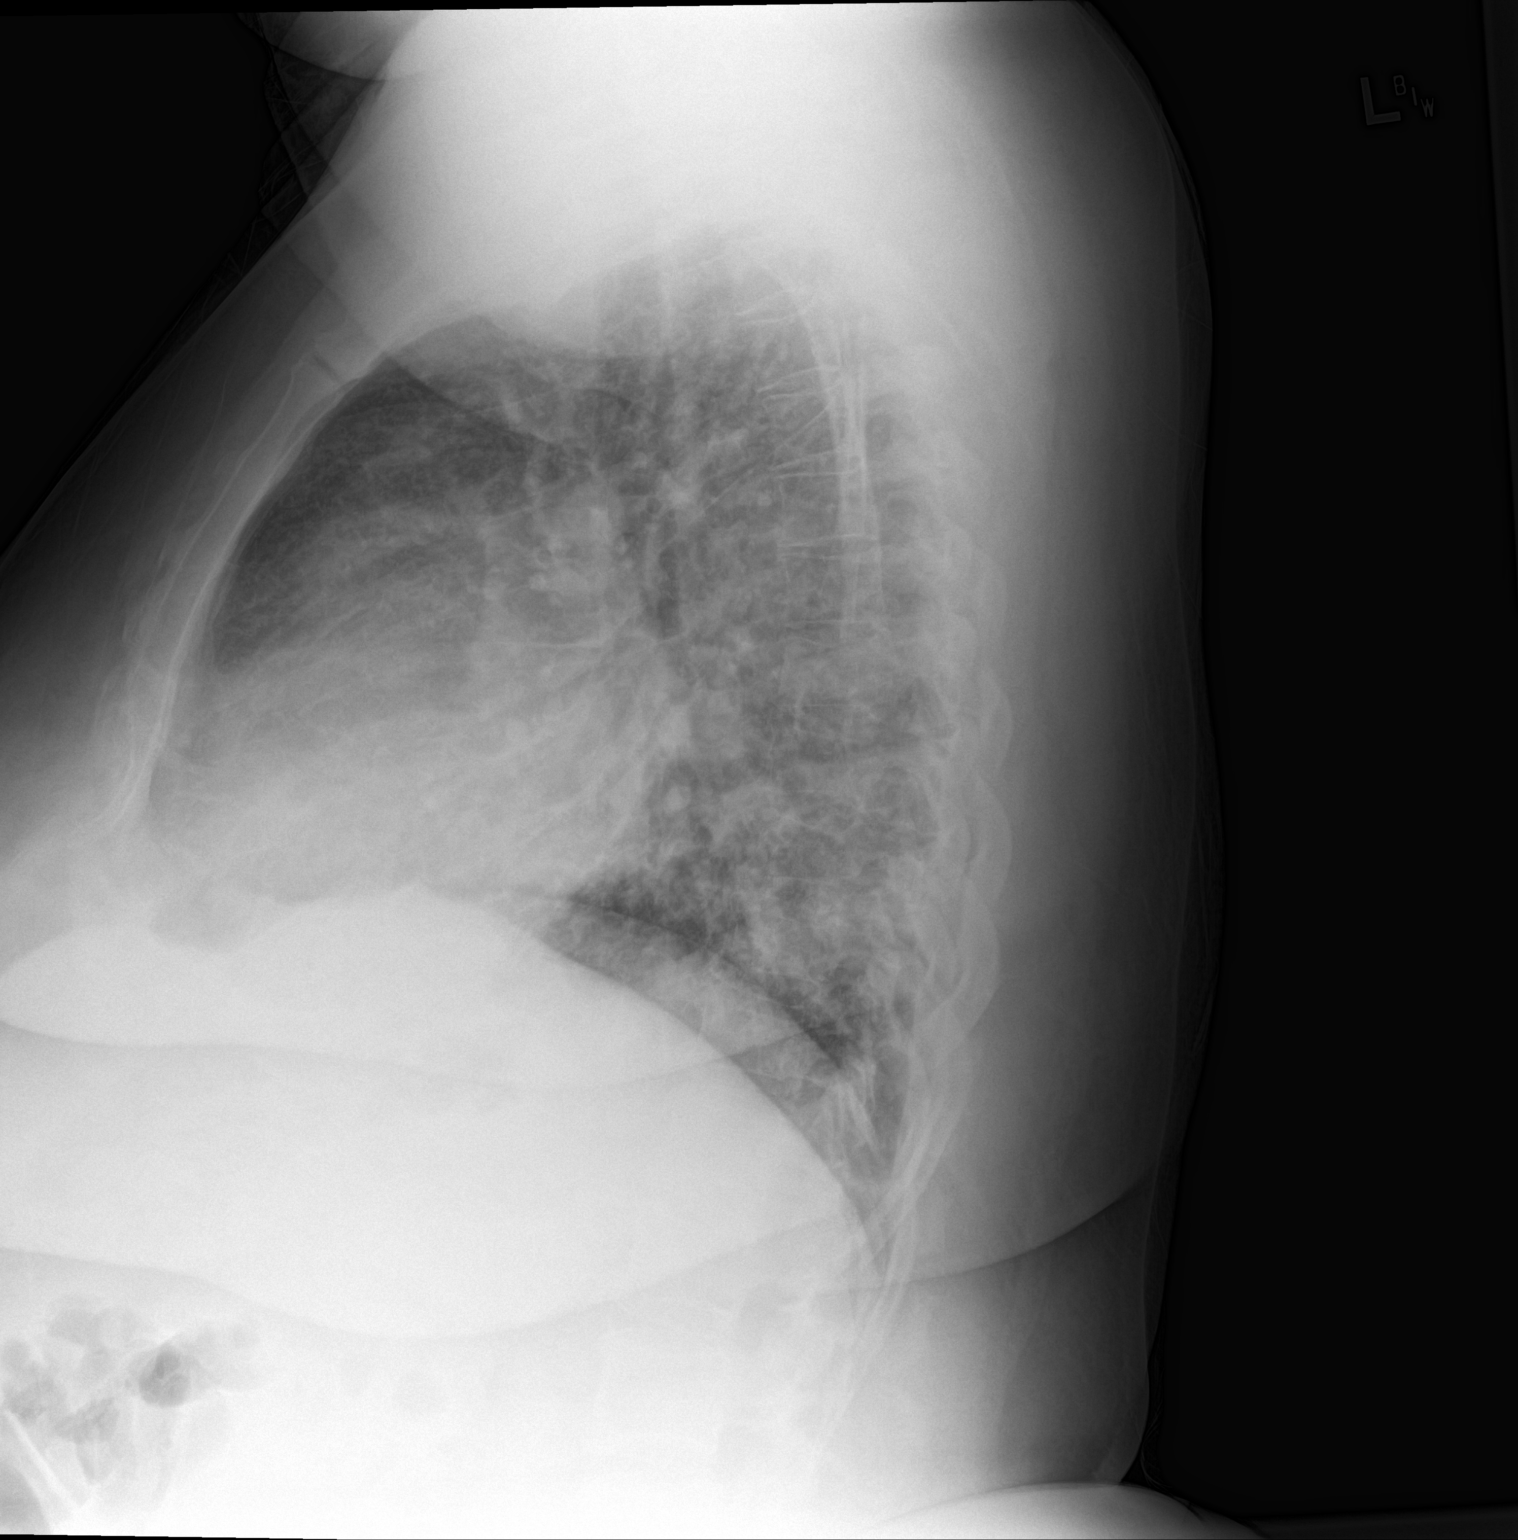

[2 of 2 positions shown; findings below may reference images not displayed]

FINDINGS: Mild cardiomegaly. Bilateral hilar prominence is similar to prior
exam. Pulmonary edema is again seen. No confluent airspace disease.
No pleural effusion. No pneumothorax. No acute osseous
abnormalities.
IMPRESSION: Findings consistent with CHF.

## 2017-11-19 MED FILL — ?FUROSEMIDE 80MG TABLET: 80 | 30 days supply | Qty: 30 | Fill #3

## 2017-11-19 MED FILL — LOSARTAN POTASSIUM 100 MG T: 100 | 30 days supply | Qty: 30 | Fill #3

## 2017-11-19 MED FILL — OXYBUTYNIN CL ER 15 MG TAB: 15 | 30 days supply | Qty: 30 | Fill #3

## 2017-11-19 MED FILL — ?AMLODIPINE BESYLATE 10MG T: 10 | 30 days supply | Qty: 30 | Fill #4

## 2017-11-19 MED FILL — ?METFORMIN HCL 1,000 MG TAB: 1000 | 30 days supply | Qty: 60 | Fill #4

## 2017-11-19 MED FILL — FERROUS SULFATE 325 MG TAB: 325 (65 FE) | 30 days supply | Qty: 90 | Fill #2

## 2017-11-19 MED FILL — ?CARVEDILOL 25 MG TABLET: 25 | 30 days supply | Qty: 60 | Fill #3

## 2017-11-19 MED FILL — glipiZIDE ER 10 MG TB24: 10 | 30 days supply | Qty: 30 | Fill #3

## 2017-11-19 MED FILL — !VENTOLIN HFA INHALER: 108 (90 BAS | 25 days supply | Qty: 18 | Fill #0

## 2017-11-23 ENCOUNTER — Other Ambulatory Visit: Payer: Self-pay | Admitting: Internal Medicine

## 2017-11-23 DIAGNOSIS — F3162 Bipolar disorder, current episode mixed, moderate: Secondary | ICD-10-CM

## 2017-11-24 MED FILL — risperiDONE 2 MG TABS: 2 | 30 days supply | Qty: 60 | Fill #0

## 2017-12-06 ENCOUNTER — Ambulatory Visit: Payer: No Typology Code available for payment source

## 2017-12-09 ENCOUNTER — Emergency Department (HOSPITAL_COMMUNITY): Payer: Self-pay

## 2017-12-09 ENCOUNTER — Emergency Department (HOSPITAL_COMMUNITY)
Admission: EM | Admit: 2017-12-09 | Discharge: 2017-12-09 | Disposition: A | Discharge status: Home / Self Care | Payer: Self-pay | Attending: Emergency Medicine | Admitting: Emergency Medicine

## 2017-12-09 ENCOUNTER — Other Ambulatory Visit: Payer: Self-pay

## 2017-12-09 ENCOUNTER — Ambulatory Visit: Payer: Self-pay | Attending: Family Medicine | Admitting: Physician Assistant

## 2017-12-09 VITALS — BP 171/109 | HR 97 | Temp 98.8°F | Resp 20 | Ht 67.0 in | Wt 340.0 lb

## 2017-12-09 DIAGNOSIS — E0590 Thyrotoxicosis, unspecified without thyrotoxic crisis or storm: Secondary | ICD-10-CM | POA: Insufficient documentation

## 2017-12-09 DIAGNOSIS — I110 Hypertensive heart disease with heart failure: Secondary | ICD-10-CM | POA: Insufficient documentation

## 2017-12-09 DIAGNOSIS — Z7984 Long term (current) use of oral hypoglycemic drugs: Secondary | ICD-10-CM | POA: Insufficient documentation

## 2017-12-09 DIAGNOSIS — Z794 Long term (current) use of insulin: Secondary | ICD-10-CM | POA: Insufficient documentation

## 2017-12-09 DIAGNOSIS — R079 Chest pain, unspecified: Secondary | ICD-10-CM

## 2017-12-09 DIAGNOSIS — I4819 Other persistent atrial fibrillation: Secondary | ICD-10-CM

## 2017-12-09 DIAGNOSIS — Z8679 Personal history of other diseases of the circulatory system: Secondary | ICD-10-CM

## 2017-12-09 DIAGNOSIS — E119 Type 2 diabetes mellitus without complications: Secondary | ICD-10-CM | POA: Insufficient documentation

## 2017-12-09 DIAGNOSIS — I252 Old myocardial infarction: Secondary | ICD-10-CM | POA: Insufficient documentation

## 2017-12-09 DIAGNOSIS — I119 Hypertensive heart disease without heart failure: Secondary | ICD-10-CM

## 2017-12-09 DIAGNOSIS — Z7982 Long term (current) use of aspirin: Secondary | ICD-10-CM | POA: Insufficient documentation

## 2017-12-09 DIAGNOSIS — R0789 Other chest pain: Secondary | ICD-10-CM | POA: Insufficient documentation

## 2017-12-09 DIAGNOSIS — I4891 Unspecified atrial fibrillation: Secondary | ICD-10-CM | POA: Insufficient documentation

## 2017-12-09 DIAGNOSIS — Z79899 Other long term (current) drug therapy: Secondary | ICD-10-CM | POA: Insufficient documentation

## 2017-12-09 DIAGNOSIS — G4733 Obstructive sleep apnea (adult) (pediatric): Secondary | ICD-10-CM | POA: Insufficient documentation

## 2017-12-09 DIAGNOSIS — I5033 Acute on chronic diastolic (congestive) heart failure: Secondary | ICD-10-CM | POA: Insufficient documentation

## 2017-12-09 DIAGNOSIS — Z888 Allergy status to other drugs, medicaments and biological substances status: Secondary | ICD-10-CM | POA: Insufficient documentation

## 2017-12-09 DIAGNOSIS — Z91018 Allergy to other foods: Secondary | ICD-10-CM | POA: Insufficient documentation

## 2017-12-09 DIAGNOSIS — Z6841 Body Mass Index (BMI) 40.0 and over, adult: Secondary | ICD-10-CM | POA: Insufficient documentation

## 2017-12-09 DIAGNOSIS — IMO0001 Reserved for inherently not codable concepts without codable children: Secondary | ICD-10-CM

## 2017-12-09 DIAGNOSIS — F319 Bipolar disorder, unspecified: Secondary | ICD-10-CM | POA: Insufficient documentation

## 2017-12-09 DIAGNOSIS — I5032 Chronic diastolic (congestive) heart failure: Secondary | ICD-10-CM | POA: Insufficient documentation

## 2017-12-09 HISTORY — DX: Other persistent atrial fibrillation: I48.19

## 2017-12-09 LAB — GLUCOSE, POCT (MANUAL RESULT ENTRY): POC GLUCOSE: 276 mg/dL — AB (ref 70–99)

## 2017-12-09 LAB — I-STAT BETA HCG BLOOD, ED (MC, WL, AP ONLY)

## 2017-12-09 LAB — PROTIME-INR
INR: 1
PROTHROMBIN TIME: 13.1 s (ref 11.4–15.2)

## 2017-12-09 LAB — CBC
HEMATOCRIT: 38.8 % (ref 36.0–46.0)
Hemoglobin: 12.4 g/dL (ref 12.0–15.0)
MCH: 27.8 pg (ref 26.0–34.0)
MCHC: 32 g/dL (ref 30.0–36.0)
MCV: 87 fL (ref 78.0–100.0)
Platelets: 243 10*3/uL (ref 150–400)
RBC: 4.46 MIL/uL (ref 3.87–5.11)
RDW: 14.1 % (ref 11.5–15.5)
WBC: 7.2 10*3/uL (ref 4.0–10.5)

## 2017-12-09 LAB — BASIC METABOLIC PANEL
Anion gap: 10 (ref 5–15)
BUN: 8 mg/dL (ref 6–20)
CHLORIDE: 102 mmol/L (ref 101–111)
CO2: 23 mmol/L (ref 22–32)
Calcium: 8.5 mg/dL — ABNORMAL LOW (ref 8.9–10.3)
Creatinine, Ser: 0.61 mg/dL (ref 0.44–1.00)
GFR calc non Af Amer: >60 mL/min (ref >60)
Glucose, Bld: 218 mg/dL — ABNORMAL HIGH (ref 65–99)
POTASSIUM: 4.1 mmol/L (ref 3.5–5.1)
SODIUM: 135 mmol/L (ref 135–145)

## 2017-12-09 LAB — I-STAT TROPONIN, ED: Troponin i, poc: 0.04 ng/mL (ref 0.00–0.08)

## 2017-12-09 LAB — BRAIN NATRIURETIC PEPTIDE: B NATRIURETIC PEPTIDE 5: 552.6 pg/mL — AB (ref 0.0–100.0)

## 2017-12-09 LAB — TSH: TSH: 2.91 u[IU]/mL (ref 0.350–4.500)

## 2017-12-09 MED ORDER — CARVEDILOL 12.5 MG PO TABS
25.0000 mg | ORAL_TABLET | Freq: Once | ORAL | Status: AC
  Administered 2017-12-09: 25 mg via ORAL
  Filled 2017-12-09: qty 2

## 2017-12-09 MED ORDER — FUROSEMIDE 10 MG/ML IJ SOLN
80.0000 mg | Freq: Once | INTRAMUSCULAR | Status: AC
  Administered 2017-12-09: 80 mg via INTRAVENOUS
  Filled 2017-12-09: qty 8

## 2017-12-09 MED ORDER — RIVAROXABAN 20 MG PO TABS: 20.0000 mg | ORAL_TABLET | Freq: Every day | ORAL | 0 refills | Status: DC

## 2017-12-09 MED ORDER — AMLODIPINE BESYLATE 5 MG PO TABS
10.0000 mg | ORAL_TABLET | Freq: Once | ORAL | Status: AC
  Administered 2017-12-09: 10 mg via ORAL
  Filled 2017-12-09: qty 2

## 2017-12-09 NOTE — ED Triage Notes (Signed)
Arrived via EMS from [place]. Onset of chest pain 1 weeks ago worsening 2 days ago with nausea and emesis. Seen at [facility] office sent to ED for evaluation.  EMS asministered aspirin 324 mg and nitro 1 tab. Pain decreased from 10/10 to a 7/10 pressure stabbingradiating to left arm with shortness of breath.

## 2017-12-09 NOTE — ED Provider Notes (Signed)
[facility] EMERGENCY DEPARTMENT Provider Note   CSN: [id number] Arrival date & time: 12/09/17  1504     History   Chief Complaint Chief Complaint  Patient presents with  . Chest Pain    HPI [name] is a 52 y.o. female with a history of hypertension heart disease, pulmonary hypertension, chronic diastolic heart failure (37/16 echo 55-60% EF, LVH, no rwma, grade 2DD, mod dil LA), MI, morbid obesity, uncontrolled insulin dependent diabetes, thyrotoxicosis (not currently on medication), chronic anemia on iron who presents to the emergency department for chest pain. Patient reports she was lying down two nights ago when she started developing palpitations along with substernal chest pain with radiation to the left neck/jaw, and left shoulder that she describes as sharp and associated with shortness of breath. She notes at onset the pain was at it's worst, 10/10. She also felt like there was something stuck in her throat at that time but denies burping/belching, indigestion or regurgitation symptoms. She reports that she had nausea and one episode of non-bilious, non-bloody emesis. The pain has wax and waned since onset. She reports that her shortness of breath is worse with exertion and chest pain mildly worsens with exertion. She has felt more fatigued since onset of symptoms. She feels similar to the chest pain she has had in the past. Patient did receive NTG by EMS that she says improved her pain. She is currently at at 0-1/10 pain level. She reports she actually has not been taking her home medications for the last 5 days. She states this is because she does not like taking her home medications when she is on her menses. She also reports she has gained 5lbs in the last 1 week from her reported baseline of 335 (last office visit with cardiology on 09/30/17 patient weighed 335) however denies any lower leg swelling, orthopnea, paroxysmal nocturnal dyspnea, or cough. Patient was  seen by PCP today and found to have A. Fib and sent for further evaluation. She denies history of this and I do not see this on her history when reviewing chart. Patient is followed by cardiology (cardiologist - Dr. [name]). Patient last visit on 09/30/17. Patient was recommended in the past to have cardiac catheterization however has declined.  She is currently medically managed.  He did have a stress test in August 2017 that showed ejection fraction of 73% with no ST segment deviation and was characterized as a low risk study.  I do not see documented history of heart catheterization. Patinet is not complaining of HA, visual changes, hearing changes, vision loss, facial drop, difficulty speaking, focal weakness, difficulty with gait, dizziness or vertigo.   HPI  Past Medical History:  Diagnosis Date  . Bipolar disease, chronic ([employer])   . Chest pain    a. 2012 Myoview: EF 63%, no isch/infarct;  b. 04/2016 Lexiscan MV: EF 73%, no ischemia/infarct-->Low risk.  . CHF (congestive heart failure) ([place])   . Chronic diastolic CHF (congestive heart failure) ([place])    a. 2015 Echo: EF 55-60%, Gr2 DD;  b. 09/2015 Echo: EF 60-65%, no rwma, mod dil LA, PASP 76mHg.  .Marland KitchenHistory of thyrotoxicosis   . Hypertensive heart disease 10/18/2013  . Insulin dependent type 2 diabetes mellitus, uncontrolled ([place]   . Mediastinal adenopathy   . MI (myocardial infarction) ([place]   . Morbid obesity with BMI of 50.0-59.9, adult ([place]   . OSA (obstructive sleep apnea) 03/06/2011    Patient Active Problem List  Diagnosis Date Noted  . Uncontrolled type 2 diabetes mellitus with hyperglycemia ([place])   . Hypokalemia   . Acute on chronic respiratory failure with hypoxia ([place])   . Cor pulmonale (chronic) ([place])   . Acute on chronic diastolic CHF (congestive heart failure) ([place]) 08/03/2017  . 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 Pyrexia   . Dyspnea 11/13/2015  . Mediastinal adenopathy 11/13/2015  . Acute on chronic  diastolic heart failure ([place]) 11/13/2015  . Abnormal CT scan, chest 11/11/2015  . Chest pain 11/11/2015  . Atypical chest pain 11/11/2015  . Acute diastolic CHF (congestive heart failure) ([place]) 10/03/2015  . Essential hypertension 03/07/2015  . Depression (emotion) 03/07/2015  . Noninfectious gastroenteritis and colitis 01/02/2015  . Sinusitis, chronic 01/02/2015  . Midline low back pain without sciatica 09/10/2014  . Bipolar 1 disorder, mixed, moderate ([place]) 07/02/2014  . Stress incontinence 07/02/2014  . Mania ([place]) 12/10/2013  . Speech abnormality 12/08/2013  . SVT (supraventricular tachycardia) ([place]) 12/06/2013  . Benign essential HTN 11/28/2013  . HTN (hypertension) 11/28/2013  . Diabetes ([place]) 11/28/2013  . Pulmonary HTN, moderate to severe 11/03/2013  . Acute on chronic diastolic congestive heart failure ([place]) 11/02/2013  . Hypertensive heart disease 10/18/2013  . IDDM (insulin dependent diabetes mellitus) ([place]) 10/18/2013  . Chronic diastolic heart failure ([place]) 07/23/2011  . OSA (obstructive sleep apnea)- non compliant with C-pap 03/06/2011  . Morbid obesity due to excess calories ([place]) 02/19/2011  . Bipolar disorder     No past surgical history on file.  OB History    No data available       Home Medications    Prior to Admission medications   Medication Sig Start Date End Date Taking? Authorizing Provider  amLODipine (NORVASC) 10 MG tablet Take 1 tablet (10 mg total) by mouth daily. 03/24/17   Tresa Garter, MD  aspirin EC 81 MG EC tablet Take 1 tablet (81 mg total) daily by mouth. 08/14/17   Barton Dubois, MD  carvedilol (COREG) 25 MG tablet Take 1 tablet (25 mg total) by mouth 2 (two) times daily. 04/12/17   Lelon Perla, MD  ferrous sulfate 325 (65 FE) MG tablet Take 1 tablet (325 mg total) 3 (three) times daily with meals by mouth. 08/13/17   Barton Dubois, MD  furosemide (LASIX) 80 MG tablet Take 1 tablet (80 mg total) by mouth daily. 08/26/17    Strader, Fransisco Hertz, PA-C  glipiZIDE (GLUCOTROL XL) 10 MG 24 hr tablet Take 1 tablet (10 mg total) by mouth daily with breakfast. 03/24/17   [name] E, MD  glucose blood test strip Use as instructed 01/06/17   Tresa Garter, MD  losartan (COZAAR) 100 MG tablet Take 1 tablet (100 mg total) by mouth daily. 03/24/17   Tresa Garter, MD  metFORMIN (GLUCOPHAGE) 1000 MG tablet Take 1 tablet (1,000 mg total) by mouth 2 (two) times daily with a meal. 03/24/17   Jegede, Marlena Clipper, MD  nitroGLYCERIN (NITROSTAT) 0.4 MG SL tablet Place 0.4 mg under the tongue every 5 (five) minutes as needed for chest pain.    [provider]  oxybutynin (DITROPAN XL) 15 MG 24 hr tablet TAKE 1 TABLET BY MOUTH AT BEDTIME. 06/03/17   Tresa Garter, MD  potassium chloride (KLOR-CON M15) 15 MEQ tablet Take 2 tablets (30 mEq total) daily by mouth. 08/14/17   Barton Dubois, MD  risperiDONE (RISPERDAL) 2 MG tablet TAKE 2 TABLETS BY MOUTH DAILY. 11/23/17   [name]  E, MD  VENTOLIN HFA 108 (90 Base) MCG/ACT inhaler INHALE 1 TO 2 PUFFS EVERY 6 HOURS AS NEEDED FOR WHEEZING/ SHORTNESS OF BREATH 09/13/17   Tresa Garter, MD    Family History Family History  Problem Relation Age of Onset  . Heart failure Father   . Stroke Father   . Hypertension Mother   . Heart disease Maternal Grandfather   . Heart disease Paternal Grandfather     Social History Social History   Tobacco Use  . Smoking status: Never Smoker  . Smokeless tobacco: Never Used  Substance Use Topics  . Alcohol use: No  . Drug use: No     Allergies   Acetaminophen; Caffeine; Farxiga [dapagliflozin]; and Lisinopril   Review of Systems Review of Systems  All other systems reviewed and are negative.    Physical Exam Updated Vital Signs BP (!) 169/105 (BP Location: Right Arm) Comment: Simultaneous filing. User may not have seen previous data.  Pulse (!) 106 Comment: Simultaneous filing. User may not  have seen previous data.  Temp 98.8 F (37.1 C)   Resp (!) 22 Comment: Simultaneous filing. User may not have seen previous data.  Ht _0  (1.702 m)   Wt (!) 154.2 kg (340 lb)   SpO2 98% Comment: Simultaneous filing. User may not have seen previous data.  BMI 53.25 kg/m   Physical Exam  Constitutional: She appears well-developed and well-nourished.  HENT:  Head: Normocephalic and atraumatic.  Right Ear: External ear normal.  Left Ear: External ear normal.  Nose: Nose normal.  Mouth/Throat: Uvula is midline, oropharynx is clear and moist and mucous membranes are normal. No tonsillar exudate.  Eyes: Pupils are equal, round, and reactive to light. Right eye exhibits no discharge. Left eye exhibits no discharge. No scleral icterus.  No exophthalmos.  Neck: Trachea normal, normal range of motion and full passive range of motion without pain. Neck supple. No JVD present. No spinous process tenderness present. Carotid bruit is not present. No neck rigidity. Normal range of motion present.  Cardiovascular: Intact distal pulses. An irregularly irregular rhythm present. Tachycardia present.  No murmur heard. Pulses:      Radial pulses are 2+ on the right side, and 2+ on the left side.       Dorsalis pedis pulses are 2+ on the right side, and 2+ on the left side.       Posterior tibial pulses are 2+ on the right side, and 2+ on the left side.  Trace to lower extremity swelling or edema. Calves symmetric in size bilaterally. No TTP   Pulmonary/Chest: Effort normal. She has decreased breath sounds in the right lower field and the left lower field. She has no wheezes. She has no rhonchi. She exhibits no tenderness.  Abdominal: Soft. Bowel sounds are normal. There is no tenderness. There is no rebound and no guarding.  Musculoskeletal: She exhibits no edema.  Lymphadenopathy:    She has no cervical adenopathy.  Neurological: She is alert.  Mental Status:  Alert, oriented, thought content  appropriate, able to give a coherent history. Speech fluent without evidence of aphasia. Able to follow 2 step commands without difficulty.  Cranial Nerves:  II:  Peripheral visual fields grossly normal, pupils equal, round, reactive to light III,IV, VI: ptosis not present, extra-ocular motions intact bilaterally  V,VII: smile symmetric, eyebrows raise symmetric, facial light touch sensation equal VIII: hearing grossly normal to voice  X: uvula elevates symmetrically  XI: bilateral shoulder shrug symmetric  and strong XII: midline tongue extension without fassiculations Motor:  Normal tone. 5/5 in upper and lower extremities bilaterally including strong and equal grip strength and dorsiflexion/plantar flexion Sensory: Sensation intact to light touch in all extremities. Negative Romberg.  Deep Tendon Reflexes: 2+ and symmetric in the biceps and patella Cerebellar: normal finger-to-nose with bilateral upper extremities. Normal heel-to -shin balance bilaterally of the lower extremity. No pronator drift.  CV: distal pulses palpable throughout   Skin: Skin is warm and dry. No rash noted. She is not diaphoretic.  No myxedema-like changes to the extremities.  Psychiatric: She has a normal mood and affect.  Nursing note and vitals reviewed.    ED Treatments / Results  Labs (all labs ordered are listed, but only abnormal results are displayed) Labs Reviewed  BASIC METABOLIC PANEL - Abnormal; Notable for the following components:      Result Value   Glucose, Bld 218 (*)    Calcium 8.5 (*)    All other components within normal limits  BRAIN NATRIURETIC PEPTIDE - Abnormal; Notable for the following components:   B Natriuretic Peptide 552.6 (*)    All other components within normal limits  CBC  TSH  PROTIME-INR  I-STAT TROPONIN, ED  I-STAT BETA HCG BLOOD, ED ([facility], [facility], [facility] ONLY)    EKG  EKG Interpretation  Date/Time:  Thursday December 09 2017 15:19:07 EDT Ventricular Rate:  102 PR  Interval:    QRS Duration: 75 QT Interval:  341 QTC Calculation: 445 R Axis:   114 Text Interpretation:  Atrial fibrillation Anterolateral infarct, old Confirmed by Fredia Sorrow [phone]) on 12/09/2017 3:26:22 PM       Radiology Dg Chest 2 View  Result Date: 12/09/2017 CLINICAL DATA:  Chest pain EXAM: CHEST - 2 VIEW COMPARISON:  08/03/2017 FINDINGS: Chronic cardiomegaly. Congested appearance of central vessels. No Kerley lines or visible effusion. No air bronchogram. Artifact from EKG leads. IMPRESSION: Chronic cardiomegaly with vascular congestion. Electronically Signed   By: Monte Fantasia M.D.   On: 12/09/2017 16:43    Procedures Procedures (including critical care time)  Medications Ordered in ED Medications  furosemide (LASIX) injection 80 mg (not administered)  carvedilol (COREG) tablet 25 mg (25 mg Oral Given 12/09/17 1723)  amLODipine (NORVASC) tablet 10 mg (10 mg Oral Given 12/09/17 1639)     Initial Impression / Assessment and Plan / ED Course  I have reviewed the triage vital signs and the nursing notes.  Pertinent labs & imaging results that were available during my care of the patient were reviewed by me and considered in my medical decision making (see chart for details).    52 y.o. female with a history of hypertension heart disease, pulmonary hypertension, chronic diastolic heart failure (68/34 echo 55-60% EF, LVH, no rwma, grade 2DD, mod dil LA), MI, morbid obesity, uncontrolled insulin dependent diabetes, thyrotoxicosis (not currently on medication), chronic anemia on iron who presents to the emergency department for chest pain. Patient reports she was lying down two nights ago when she started developing palpitations along with substernal chest pain with radiation to the left neck/jaw, and left shoulder that she describes as sharp and associated with shortness of breath. She notes at onset the pain was at it's worst, 10/10. She also felt like there was something  stuck in her throat at that time but denies burping/belching, indigestion or regurgitation symptoms. She reports that she had nausea and one episode of non-bilious, non-bloody emesis. The pain has wax and waned since onset. She  reports that her shortness of breath is worse with exertion and chest pain mildly worsens with exertion. She has felt more fatigued since onset of symptoms. She feels similar to the chest pain she has had in the past. Patient did receive NTG by EMS that she says improved her pain. She is currently at at 0-1/10 pain level. She reports she actually has not been taking her home medications for the last 5 days. She states this is because she does not like taking her home medications when she is on her menses. She also reports she has gained 5lbs in the last 1 week from her reported baseline of 335 (last office visit with cardiology on 09/30/17 patient weighed 335) however denies any lower leg swelling, orthopnea, paroxysmal nocturnal dyspnea, or cough. Patient was seen by PCP today and found to have A. Fib and sent for further evaluation. She denies history of this and I do not see this on her history when reviewing chart. Patient is followed by cardiology (cardiologist - Dr. [name]). Patient last visit on 09/30/17. Patient was recommended in the past to have cardiac catheterization however has declined.  She is currently medically managed.  He did have a stress test in August 2017 that showed ejection fraction of 73% with no ST segment deviation and was characterized as a low risk study.  I do not see documented history of heart catheterization. Patinet is not complaining of HA, visual changes, hearing changes, vision loss, facial drop, difficulty speaking, focal weakness, difficulty with gait, dizziness or vertigo.   MDM - Dr. Marlou Porch of Cardiology consulted   CHF excerebration - Patient with 5 pound weight gain in the last 1 week.  Off all medications for the last 5 days.  Noting chest pain  and shortness of breath of the last 2 days.  No lower extremity edema but decreased breath sounds at the bases.  She is satting greater than 93% on room air.  Patient does have pulmonary vascular congestion on chest x-ray and BNP greater than 500. 65m  IV Lasix initiated.  Cardiology recommends patient continue outpatient medication and follow-up in 1 week.  Feel the patient can be treated on outpatient basis.  Chest pain - 2-day history of waxing and waning substernal chest pain with radiation to left jaw/neck and shoulder with associated shortness of breath, nausea and occasional emesis that is occasionally worsened by exertion and was relieved after nitroglycerin use.  Has refused heart catheterization in the past.  Is currently chest pain-free.  EKG without new ischemic changes. Troponin 0.04.  No evidence of kidney disease.  Other lab work reassuring.  Chest xray as above.  Patient does not wish to have admission for chest pain workup.  She denies heart catheterization still.  Cardiology does not feel patient needs admission for this at this time.  Will advise restarting home medications and follow-up with cardiology in 1 week.  New onset a-fib -this is a new problem.  Onset greater than 48 hours ago and on elective to cardioversion.  She is neurologically intact.  Rate currently controlled. No interventions needed at this time.  History of thyrotoxicosis but TSH 2.9.  Exam not consistent with thyrotoxicosis.  Baseline PT/INR obtained. CHA2DS2-VASc score 4.  This places the patient at a 4.8% stroke risk per year.  Cardiology has recommended starting the patient on Xarelto.  I consulted case management which has provided the patient with a 30-day Xarelto card.  Patient's kidney function within normal limits.  Patient will  be started on Xarelto for atrial fibrillation guidelines.  Risks of Xarelto discussed with patient and provided in discharge instructions.  Blood pressure - Patient blood pressure was  noted to be elevated during stay.  Patient's home blood pressure medication was given as well as IV Lasix.  Patient blood pressure is downtrending.  Will encourage to take home blood pressure medication.  The evaluation does not show pathology that would require ongoing emergent intervention or inpatient treatment. I advised the patient to follow-up with PCP and Cardiology this week. I advised the patient to return to the emergency department with new or worsening symptoms or new concerns. Specific return precautions discussed. The patient verbalized understanding and agreement with plan. All questions answered. No further questions at this time. The patient is hemodynamically stable, mentating appropriately and appears safe for discharge.  Vitals:   12/09/17 1723 12/09/17 1730 12/09/17 1800 12/09/17 1844  BP: (!) 188/125 (!) 164/106 (!) 163/118 (!) 142/104  Pulse: 87 95 100   Resp:  18 (!) 22   Temp:      SpO2:  96% 93%   Weight:      Height:       Patient case discussed with Dr. Rogene Houston who is in agreement with plan.  Final Clinical Impressions(s) / ED Diagnoses   Final diagnoses:  New onset atrial fibrillation ([place])  Other chest pain  History of hypertension  Acute on chronic diastolic congestive heart failure [facility])    ED Discharge Orders        Ordered    rivaroxaban (XARELTO) 20 MG TABS tablet  Daily with supper     12/09/17 2054       Lorelle Gibbs 12/09/17 2058    Fredia Sorrow, MD 12/10/17 [phone]

## 2017-12-09 NOTE — ED Notes (Signed)
Pt given [place] sandwich and cup of water per PA.

## 2017-12-09 NOTE — Progress Notes (Signed)
Patient ID: [name], female   DOB: 07/15/1966, 52 y.o.   MRN: [id number]    [name], is a 52 y.o. female  UXL:[id number]  ZDG:[id number]  DOB - January 02, 1966  Subjective:  Chief Complaint and HPI: [name] is a 52 y.o. female here today bc she Had an episode CP all night about 4 days. Also felt SOB when it was occurring. She also had some vomiting that night.  She felt fine the next day.  Also c/o all over joint pain.  No f/c.  No cough.  Some sneezing.  Has cardiology follow-up appt on 01/04/2018.  Pain was in middle of chest and was relieved with a hot water bottle.  She didn't take NTG when the pain occurred.  She did take some aspirin.  The pain was mild to moderate.  No diaphoresis.  She has not taken any of her medications today.  She denies any CP today/currently.    Here with her mom.     ROS:   Constitutional:  No f/c, No night sweats, No unexplained weight loss. EENT:  No vision changes, No blurry vision, No hearing changes. No mouth, throat, or ear problems.  Respiratory: No cough, + SOB that she considers to be at her baseline Cardiac: No CP today, no palpitations GI:  No abd pain, No N/V/D. GU: No Urinary s/sx Musculoskeletal: No joint pain Neuro: No headache, no dizziness, no motor weakness.  Skin: No rash Endocrine:  No polydipsia. No polyuria.  Psych: Denies SI/HI  No problems updated.  ALLERGIES: Allergies  Allergen Reactions  . Acetaminophen     Fits as a child "seizures-like"  . Caffeine     Tense, anxiety, increased urination  . [place] [Dapagliflozin] Other (See Comments)    Hallucinations, drop in blood sugar.   . Lisinopril Rash    Rash with lisinopril; but fosinopril is ok per patient    PAST MEDICAL HISTORY: Past Medical History:  Diagnosis Date  . Bipolar disease, chronic ([place])   . Chest pain    a. 2012 Myoview: EF 63%, no isch/infarct;  b. 04/2016 Lexiscan MV: EF 73%, no ischemia/infarct-->Low risk.  . CHF (congestive heart  failure) ([place])   . Chronic diastolic CHF (congestive heart failure) ([place])    a. 2015 Echo: EF 55-60%, Gr2 DD;  b. 09/2015 Echo: EF 60-65%, no rwma, mod dil LA, PASP 12mmHg.  Marland Kitchen History of thyrotoxicosis   . Hypertensive heart disease 10/18/2013  . Insulin dependent type 2 diabetes mellitus, uncontrolled ([place])   . Mediastinal adenopathy   . MI (myocardial infarction) ([place])   . Morbid obesity with BMI of 50.0-59.9, adult ([place])   . OSA (obstructive sleep apnea) 03/06/2011    MEDICATIONS AT HOME: Prior to Admission medications   Medication Sig Start Date End Date Taking? Authorizing Provider  amLODipine (NORVASC) 10 MG tablet Take 1 tablet (10 mg total) by mouth daily. 03/24/17  Yes Tresa Garter, MD  aspirin EC 81 MG EC tablet Take 1 tablet (81 mg total) daily by mouth. 08/14/17  Yes Barton Dubois, MD  carvedilol (COREG) 25 MG tablet Take 1 tablet (25 mg total) by mouth 2 (two) times daily. 04/12/17  Yes Lelon Perla, MD  ferrous sulfate 325 (65 FE) MG tablet Take 1 tablet (325 mg total) 3 (three) times daily with meals by mouth. 08/13/17  Yes Barton Dubois, MD  furosemide (LASIX) 80 MG tablet Take 1 tablet (80 mg total) by mouth daily. 08/26/17  Yes Munhall, Fransisco Hertz,  PA-C  glipiZIDE (GLUCOTROL XL) 10 MG 24 hr tablet Take 1 tablet (10 mg total) by mouth daily with breakfast. 03/24/17  Yes Jegede, Olugbemiga E, MD   glucose blood test strip Use as instructed 01/06/17  Yes Jegede, Olugbemiga E, MD  losartan (COZAAR) 100 MG tablet Take 1 tablet (100 mg total) by mouth daily. 03/24/17  Yes Tresa Garter, MD  metFORMIN (GLUCOPHAGE) 1000 MG tablet Take 1 tablet (1,000 mg total) by mouth 2 (two) times daily with a meal. 03/24/17  Yes Jegede, Olugbemiga E, MD  nitroGLYCERIN (NITROSTAT) 0.4 MG SL tablet Place 0.4 mg under the tongue every 5 (five) minutes as needed for chest pain.   Yes [provider]  oxybutynin (DITROPAN XL) 15 MG 24 hr tablet TAKE 1 TABLET BY MOUTH AT BEDTIME.  06/03/17  Yes Jegede, Olugbemiga E, MD  potassium chloride (KLOR-CON M15) 15 MEQ tablet Take 2 tablets (30 mEq total) daily by mouth. 08/14/17  Yes Barton Dubois, MD  risperiDONE (RISPERDAL) 2 MG tablet TAKE 2 TABLETS BY MOUTH DAILY. 11/23/17  Yes Jegede, Marlena Clipper, MD  VENTOLIN HFA 108 (90 Base) MCG/ACT inhaler INHALE 1 TO 2 PUFFS EVERY 6 HOURS AS NEEDED FOR WHEEZING/ SHORTNESS OF BREATH 09/13/17  Yes Tresa Garter, MD     Objective:  EXAM:   Vitals:   12/09/17 1341  BP: (!) 171/109  Pulse: 97  Resp: 20  Temp: 98.8 F (37.1 C)  TempSrc: Oral  SpO2: 97%  Weight: (!) 340 lb (154.2 kg)  Height: 5\' 7"  (1.702 m)    General appearance : A&OX3. NAD. Non-toxic-appearing HEENT: Atraumatic and Normocephalic.  PERRLA. EOM intact.   Neck: supple, no JVD. No cervical lymphadenopathy. No thyromegaly Chest/Lungs:  Breathing-non-labored, Good air entry bilaterally, breath sounds normal without rales, rhonchi, or wheezing  CVS: S1 S2 regular, no murmurs, gallops, rubs  Extremities: Bilateral Lower Ext shows no edema, both legs are warm to touch with = pulse throughout Neurology:  CN II-XII grossly intact, Non focal.   Psych:  TP linear. J/I WNL. Normal speech. Appropriate eye contact and affect.  Skin:  No Rash  Data Review Lab Results  Component Value Date   HGBA1C 8.6 10/06/2017   HGBA1C 9.7 (H) 08/06/2017   HGBA1C 10.2 03/24/2017     Assessment & Plan   1. Chest pain, unspecified type  Afib on EKG.  911 called and transported via EMS to hospital  2. IDDM (insulin dependent diabetes mellitus) ([place]) Uncontrolled.  Adhere to regimen and diabetic diet.  Compliance imperative but she is non-chalant about adherence at best saying that she is happy with blood sugars around 300 and that is "normal" for her.   - Glucose (CBG)  3. Hypertensive heart disease, unspecified whether heart failure present Uncontrolled but didn't take any meds today To ED for #1.  No increase in  edema/SOB overall.  Minimal edema today and weight only 5 pounds more than when seen in January. Compliance with medications imperative at all times.     Patient have been counseled extensively about nutrition and exercise  Return in about 6 weeks (around 01/20/2018) for assign new PCP; f/up DM and htn.  The patient was given clear instructions to go to ER or return to medical center if symptoms don't improve, worsen or new problems develop. The patient verbalized understanding. The patient was told to call to get lab results if they haven't heard anything in the next week.     Freeman Caldron, PA-C [facility] [place], [place]   12/09/2017, 1:54 PM

## 2017-12-09 NOTE — ED Notes (Signed)
Pt refused vitals 

## 2017-12-09 NOTE — Care Management Note (Signed)
Case Management Note  Patient Details  Name: [name] MRN: [id number] Date of Birth: 1966-02-20  Subjective/Objective:                  Chest Pain  Action/Plan: [facility] spoke with the patient at the bedside. Patient provided with a 30 day free Xarelto card.   Expected Discharge Date:    12/09/17              Expected Discharge Plan:  Home/Self Care  In-House Referral:     Discharge planning Services  CM Consult, Medication Assistance  Post Acute Care Choice:    Choice offered to:     DME Arranged:    DME Agency:     HH Arranged:    HH Agency:     Status of Service:  Completed, signed off  If discussed at [employer] of Stay Meetings, dates discussed:    Additional Comments:  Apolonio Schneiders, RN 12/09/2017, 7:43 PM

## 2017-12-09 NOTE — ED Notes (Signed)
Pt back from X-ray.  

## 2017-12-09 NOTE — ED Notes (Signed)
Patient transported to X-ray 

## 2017-12-09 NOTE — Discharge Instructions (Signed)
Your seen here today for chest pain.  You were found to have atrial fibrillation on your EKG. Atrial fibrillation is a type of heartbeat that is irregular or fast (rapid). If you have this condition, your heart keeps quivering in a weird (chaotic) way. This condition can make it so your heart cannot pump blood normally. Having this condition gives a person more risk for stroke, heart failure, and other heart problems.  For this reason I have started you on a blood thinning medication called Xarelto.  Case management has provided you with a 30-day card for this.  Please take this medication as prescribed.  Please note that if you fall and hit your head you do have a increased risk of bleeding while on this medication.  Please follow-up with your cardiologist and in the next week for this.  You are found to have symptoms consistent with heart failure today.  This is likely because you have not been taking her home Lasix medication.  Please restart this medication.  These follow with your primary care doctor and cardiologist in regards to this.  Your blood pressure was noted to be elevated in the department today.  Please restart your home blood pressure medications.  Please monitor your blood pressure at home.  Follow-up with your primary care doctor in regards to this.  You are evaluated for chest pain today.  You have been recommended for evaluation of this but declined.  Please follow-up with cardiology in the next 1 week.  Please return for any chest pain, difficulty breathing, lower leg swelling, changes in vision, weakness on one side of your body or any other new concerning symptoms.

## 2017-12-09 NOTE — Patient Instructions (Signed)

## 2017-12-23 ENCOUNTER — Ambulatory Visit (HOSPITAL_COMMUNITY)
Admission: RE | Admit: 2017-12-23 | Discharge: 2017-12-23 | Disposition: A | Discharge status: Home / Self Care | Payer: Self-pay | Source: Ambulatory Visit | Attending: Nurse Practitioner | Admitting: Nurse Practitioner

## 2017-12-23 ENCOUNTER — Other Ambulatory Visit: Payer: Self-pay

## 2017-12-23 ENCOUNTER — Encounter (HOSPITAL_COMMUNITY): Payer: Self-pay | Admitting: Emergency Medicine

## 2017-12-23 ENCOUNTER — Emergency Department (HOSPITAL_COMMUNITY)
Admission: EM | Admit: 2017-12-23 | Discharge: 2017-12-24 | Disposition: A | Discharge status: Home / Self Care | Payer: Self-pay | Attending: Emergency Medicine | Admitting: Emergency Medicine

## 2017-12-23 ENCOUNTER — Encounter (HOSPITAL_COMMUNITY): Payer: Self-pay | Admitting: Nurse Practitioner

## 2017-12-23 ENCOUNTER — Emergency Department (HOSPITAL_COMMUNITY): Payer: Self-pay

## 2017-12-23 VITALS — BP 146/94 | HR 104 | Ht 67.0 in | Wt 335.0 lb

## 2017-12-23 DIAGNOSIS — G4733 Obstructive sleep apnea (adult) (pediatric): Secondary | ICD-10-CM | POA: Insufficient documentation

## 2017-12-23 DIAGNOSIS — Z7901 Long term (current) use of anticoagulants: Secondary | ICD-10-CM | POA: Insufficient documentation

## 2017-12-23 DIAGNOSIS — E119 Type 2 diabetes mellitus without complications: Secondary | ICD-10-CM | POA: Insufficient documentation

## 2017-12-23 DIAGNOSIS — I2510 Atherosclerotic heart disease of native coronary artery without angina pectoris: Secondary | ICD-10-CM | POA: Insufficient documentation

## 2017-12-23 DIAGNOSIS — Z7984 Long term (current) use of oral hypoglycemic drugs: Secondary | ICD-10-CM | POA: Insufficient documentation

## 2017-12-23 DIAGNOSIS — Z6841 Body Mass Index (BMI) 40.0 and over, adult: Secondary | ICD-10-CM | POA: Insufficient documentation

## 2017-12-23 DIAGNOSIS — F319 Bipolar disorder, unspecified: Secondary | ICD-10-CM | POA: Insufficient documentation

## 2017-12-23 DIAGNOSIS — Z8249 Family history of ischemic heart disease and other diseases of the circulatory system: Secondary | ICD-10-CM | POA: Insufficient documentation

## 2017-12-23 DIAGNOSIS — Z79899 Other long term (current) drug therapy: Secondary | ICD-10-CM | POA: Insufficient documentation

## 2017-12-23 DIAGNOSIS — I5032 Chronic diastolic (congestive) heart failure: Secondary | ICD-10-CM | POA: Insufficient documentation

## 2017-12-23 DIAGNOSIS — R079 Chest pain, unspecified: Secondary | ICD-10-CM | POA: Insufficient documentation

## 2017-12-23 DIAGNOSIS — Z794 Long term (current) use of insulin: Secondary | ICD-10-CM | POA: Insufficient documentation

## 2017-12-23 DIAGNOSIS — I110 Hypertensive heart disease with heart failure: Secondary | ICD-10-CM | POA: Insufficient documentation

## 2017-12-23 DIAGNOSIS — I252 Old myocardial infarction: Secondary | ICD-10-CM | POA: Insufficient documentation

## 2017-12-23 DIAGNOSIS — Z7982 Long term (current) use of aspirin: Secondary | ICD-10-CM | POA: Insufficient documentation

## 2017-12-23 DIAGNOSIS — Z888 Allergy status to other drugs, medicaments and biological substances status: Secondary | ICD-10-CM | POA: Insufficient documentation

## 2017-12-23 DIAGNOSIS — I4891 Unspecified atrial fibrillation: Secondary | ICD-10-CM | POA: Insufficient documentation

## 2017-12-23 DIAGNOSIS — Z823 Family history of stroke: Secondary | ICD-10-CM | POA: Insufficient documentation

## 2017-12-23 LAB — CBC
HEMATOCRIT: 40.1 % (ref 36.0–46.0)
HEMOGLOBIN: 12.7 g/dL (ref 12.0–15.0)
MCH: 27.9 pg (ref 26.0–34.0)
MCHC: 31.7 g/dL (ref 30.0–36.0)
MCV: 87.9 fL (ref 78.0–100.0)
Platelets: 247 10*3/uL (ref 150–400)
RBC: 4.56 MIL/uL (ref 3.87–5.11)
RDW: 14.1 % (ref 11.5–15.5)
WBC: 6.8 10*3/uL (ref 4.0–10.5)

## 2017-12-23 LAB — I-STAT BETA HCG BLOOD, ED (MC, WL, AP ONLY): I-stat hCG, quantitative: <5 m[IU]/mL (ref <5)

## 2017-12-23 LAB — I-STAT TROPONIN, ED
Troponin i, poc: 0 ng/mL (ref 0.00–0.08)
Troponin i, poc: 0.02 ng/mL (ref 0.00–0.08)

## 2017-12-23 LAB — BASIC METABOLIC PANEL
Anion gap: 10 (ref 5–15)
BUN: 10 mg/dL (ref 6–20)
CHLORIDE: 97 mmol/L — AB (ref 101–111)
CO2: 26 mmol/L (ref 22–32)
Calcium: 8.7 mg/dL — ABNORMAL LOW (ref 8.9–10.3)
Creatinine, Ser: 0.63 mg/dL (ref 0.44–1.00)
GFR calc Af Amer: >60 mL/min (ref >60)
GFR calc non Af Amer: >60 mL/min (ref >60)
GLUCOSE: 280 mg/dL — AB (ref 65–99)
POTASSIUM: 3.9 mmol/L (ref 3.5–5.1)
Sodium: 133 mmol/L — ABNORMAL LOW (ref 135–145)

## 2017-12-23 LAB — BRAIN NATRIURETIC PEPTIDE: B Natriuretic Peptide: 285.1 pg/mL — ABNORMAL HIGH (ref 0.0–100.0)

## 2017-12-23 MED ORDER — IOPAMIDOL (ISOVUE-370) INJECTION 76%
INTRAVENOUS | Status: AC
  Administered 2017-12-23: 100 mL
  Filled 2017-12-23: qty 100

## 2017-12-23 MED ORDER — RIVAROXABAN 20 MG PO TABS: 20.0000 mg | ORAL_TABLET | Freq: Every day | ORAL | 0 refills | Status: DC

## 2017-12-23 NOTE — Patient Instructions (Signed)
Call Dr. Lamonte Sakai office for follow up.

## 2017-12-23 NOTE — ED Triage Notes (Signed)
Pt arrives to ED from A fib clinic for chest pain. Pt reports shortness of breath worse with any exertion. Pt was just started on xarelto today.

## 2017-12-23 NOTE — ED Notes (Signed)
Results reviewed

## 2017-12-23 NOTE — Progress Notes (Signed)
Primary Care Physician: Tresa Garter, MD Referring Physician: [facility] ER f/u   [name] is a 52 y.o. female with a h/o bipolar disease, HTN, DM, CHF, CAD, OSA, has not returned for CPAP titration, in the afib clinic for new onset afib, f/u ER visit, 3/14.She is in afib, reasonably rate controlled. She states that she has not taken any meds this am and did not start xarelto as she said 2 different drugstores would not honor the [employer] from the hospital. Chadsvasc score is at least 4. She is also c/o chest pain that started this am and is requesting to be sent to the ER after this appointment. She appears comfortable in the office and EKG is negative for acute ST changes.  Today, she denies symptoms of palpitations, chest pain, shortness of breath, orthopnea, PND, lower extremity edema, dizziness, presyncope, syncope, or neurologic sequela. The patient is tolerating medications without difficulties and is otherwise without complaint today.   Past Medical History:  Diagnosis Date  . Bipolar disease, chronic ([place])   . Chest pain    a. 2012 Myoview: EF 63%, no isch/infarct;  b. 04/2016 Lexiscan MV: EF 73%, no ischemia/infarct-->Low risk.  . CHF (congestive heart failure) ([place])   . Chronic diastolic CHF (congestive heart failure) ([place])    a. 2015 Echo: EF 55-60%, Gr2 DD;  b. 09/2015 Echo: EF 60-65%, no rwma, mod dil LA, PASP 39mmHg.  Marland Kitchen History of thyrotoxicosis   . Hypertensive heart disease 10/18/2013  . Insulin dependent type 2 diabetes mellitus, uncontrolled ([place])   . Mediastinal adenopathy   . MI (myocardial infarction) ([place])   . Morbid obesity with BMI of 50.0-59.9, adult ([place])   . OSA (obstructive sleep apnea) 03/06/2011   No past surgical history on file.  Current Outpatient Medications  Medication Sig Dispense Refill  . amLODipine (NORVASC) 10 MG tablet Take 1 tablet (10 mg total) by mouth daily. 90 tablet 3  . aspirin EC 81 MG EC tablet Take 1 tablet (81 mg total) daily by mouth.  30 tablet 1  . carvedilol (COREG) 25 MG tablet Take 1 tablet (25 mg total) by mouth 2 (two) times daily. 180 tablet 3  . ferrous sulfate 325 (65 FE) MG tablet Take 1 tablet (325 mg total) 3 (three) times daily with meals by mouth. (Patient taking differently: Take 325 mg by mouth daily with breakfast. ) 90 tablet 2  . furosemide (LASIX) 80 MG tablet Take 1 tablet (80 mg total) by mouth daily. 30 tablet 5  . glipiZIDE (GLUCOTROL XL) 10 MG 24 hr tablet Take 1 tablet (10 mg total) by mouth daily with breakfast. 90 tablet 3  . glucose blood test strip Use as instructed 100 each 12  . losartan (COZAAR) 100 MG tablet Take 1 tablet (100 mg total) by mouth daily. 90 tablet 3  . metFORMIN (GLUCOPHAGE) 1000 MG tablet Take 1 tablet (1,000 mg total) by mouth 2 (two) times daily with a meal. 180 tablet 3  . nitroGLYCERIN (NITROSTAT) 0.4 MG SL tablet Place 0.4 mg under the tongue every 5 (five) minutes as needed for chest pain.    Marland Kitchen oxybutynin (DITROPAN XL) 15 MG 24 hr tablet TAKE 1 TABLET BY MOUTH AT BEDTIME. 30 tablet 3  . potassium chloride (KLOR-CON M15) 15 MEQ tablet Take 2 tablets (30 mEq total) daily by mouth. 60 tablet 1  . risperiDONE (RISPERDAL) 2 MG tablet TAKE 2 TABLETS BY MOUTH DAILY. 60 tablet 3  . VENTOLIN HFA 108 (90  Base) MCG/ACT inhaler INHALE 1 TO 2 PUFFS EVERY 6 HOURS AS NEEDED FOR WHEEZING/ SHORTNESS OF BREATH 18 g 1  . rivaroxaban (XARELTO) 20 MG TABS tablet Take 1 tablet (20 mg total) by mouth daily with supper. 30 tablet 0   No current facility-administered medications for this encounter.     Allergies  Allergen Reactions  . Acetaminophen     Fits as a child "seizures-like"  . Caffeine     Tense, anxiety, increased urination  . [place] [Dapagliflozin] Other (See Comments)    Hallucinations, drop in blood sugar.   . Lisinopril Rash    Rash with lisinopril; but fosinopril is ok per patient    Social History   Socioeconomic History  . Marital status: Single    Spouse name:  Not on file  . Number of children: 0  . Years of education: 70  . Highest education level: Not on file  Occupational History  . Occupation: unemployed  Social Needs  . Financial resource strain: Not on file  . Food insecurity:    Worry: Not on file    Inability: Not on file  . Transportation needs:    Medical: Not on file    Non-medical: Not on file  Tobacco Use  . Smoking status: Never Smoker  . Smokeless tobacco: Never Used  Substance and Sexual Activity  . Alcohol use: No  . Drug use: No  . Sexual activity: Not Currently    Birth control/protection: None  Lifestyle  . Physical activity:    Days per week: Not on file    Minutes per session: Not on file  . Stress: Not on file  Relationships  . Social connections:    Talks on phone: Not on file    Gets together: Not on file    Attends religious service: Not on file    Active member of club or organization: Not on file    Attends meetings of clubs or organizations: Not on file    Relationship status: Not on file  . Intimate partner violence:    Fear of current or ex partner: Not on file    Emotionally abused: Not on file    Physically abused: Not on file    Forced sexual activity: Not on file  Other Topics Concern  . 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 But ran out of money and is no longer attending school. (Note patient has bipolar disorder).      Born in [place] but lived in [place] before coming back to [place].       Primary language is Arabic. Lives with mother and brother.             Family History  Problem Relation Age of Onset  . Heart failure Father   . Stroke Father   . Hypertension Mother   . Heart disease Maternal Grandfather   . Heart disease Paternal Grandfather     ROS- All systems are reviewed and negative except as per the HPI above  Physical Exam: Vitals:   12/23/17 1411    BP: (!) 146/94  Pulse: (!) 104  Weight: (!) 335 lb (152 kg)  Height: 5\' 7"  (1.702 m)   Wt Readings from Last 3 Encounters:  12/23/17 (!) 335 lb (152 kg)  12/09/17 (!) 340 lb (154.2 kg)  12/09/17 (!) 340 lb (  154.2 kg)    Labs: Lab Results  Component Value Date   NA 135 12/09/2017   K 4.1 12/09/2017   CL 102 12/09/2017   CO2 23 12/09/2017   GLUCOSE 218 (H) 12/09/2017   BUN 8 12/09/2017   CREATININE 0.61 12/09/2017   CALCIUM 8.5 (L) 12/09/2017   PHOS 4.2 02/05/2011   MG 1.6 08/24/2017   Lab Results  Component Value Date   INR 1.00 12/09/2017   Lab Results  Component Value Date   CHOL 173 08/06/2016   HDL 45 (L) 08/06/2016   LDLCALC 86 08/06/2016   TRIG 212 (H) 08/06/2016     GEN- The patient is well appearing, alert and oriented x 3 today.   Head- normocephalic, atraumatic Eyes-  Sclera clear, conjunctiva pink Ears- hearing intact Oropharynx- clear Neck- supple, no JVP Lymph- no cervical lymphadenopathy Lungs- Clear to ausculation bilaterally, normal work of breathing Heart-irregular rate and rhythm, no murmurs, rubs or gallops, PMI not laterally displaced GI- soft, NT, ND, + BS Extremities- no clubbing, cyanosis, or edema MS- no significant deformity or atrophy Skin- no rash or lesion Psych- euthymic mood, full affect Neuro- strength and sensation are intact  EKG- afib with RVR at 104 bpm, qrs int 74 ms, qtc 433 ms Epic records reviewed Echo-Study Conclusions  - Left ventricle: The cavity size was normal. Wall thickness was   increased in a pattern of moderate LVH. Systolic function was   normal. The estimated ejection fraction was in the range of 55%   to 60%. Wall motion was normal; there were no regional wall   motion abnormalities. Features are consistent with a pseudonormal   left ventricular filling pattern, with concomitant abnormal   relaxation and increased filling pressure (grade 2 diastolic   dysfunction). - Mitral valve: Moderately to  severely calcified annulus. Valve   area by continuity equation (using LVOT flow): 1.8 cm^2. - Left atrium: The atrium was moderately dilated. - Right atrium: The atrium was mildly to moderately dilated. - Tricuspid valve: There was mild-moderate regurgitation. - Pulmonary arteries: Systolic pressure was moderately increased.   PA peak pressure: 58 mm Hg (S).   Assessment and Plan: 1. New onset afib General discussion re afib explained Triggers discussed Encouraged her to f/u with Dr. Lamonte Sakai to get cpap titration trial as untreated sleep apnea may be contributing to afib She would benefit from weight loss and an exercise program Denies excessive caffeine, alcohol or tobacco Chadsvasc score of at least 4 Will print out a new RX for xarelto 20 mg, free 30 day trial to get started on anticoagulation, she does not have insurance and may need to be transitioned to warfarin later but if on anticoagulation x 3 weeks may can proceed to cardioversion to see if SR can be restored Risk vrs benefit of anticoagulation discussed, denies bleeding history, bleeding precautions discussed  2.  Chest pain Somewhat chronic when I review her notes However, she says that she has had since 11 am today and wants to go to the ER after this visit. Appears  comfortable in the office and EKG without any acute changes Has refused LHC in the pat Weight is down 5 lbs  To the ER by w/c at pt's request  F/u with Dr. [name] 4/9 afib clinic as needed

## 2017-12-23 NOTE — ED Notes (Signed)
Pt. Changing in room

## 2017-12-23 NOTE — ED Notes (Signed)
Patient has been provided with 3 [place] sandwich meals per her request and ate 100% of them.

## 2017-12-24 NOTE — Discharge Instructions (Addendum)
Return here as needed.  Follow-up with your cardiologist. °

## 2017-12-24 NOTE — ED Provider Notes (Signed)
[facility] EMERGENCY DEPARTMENT Provider Note   CSN: [id number] Arrival date & time: 12/23/17  1449     History   Chief Complaint Chief Complaint  Patient presents with  . Chest Pain    HPI [name] is a 52 y.o. female.  HPI Patient presents to the emergency department with chest discomfort that started earlier today.  The patient was seen at the Coumadin clinic and advised him that she went to be seen in the emergency department.  Patient states that the pain was been fairly constant.  She states that this does not seem as bad as the previous pain she has had in the past.  Patient states that nothing seems to make the condition better or worse.  The patient denies  shortness of breath, headache,blurred vision, neck pain, fever, cough, weakness, numbness, dizziness, anorexia, edema, abdominal pain, nausea, vomiting, diarrhea, rash, back pain, dysuria, hematemesis, bloody stool, near syncope, or syncope. Past Medical History:  Diagnosis Date  . Bipolar disease, chronic ([place])   . Chest pain    a. 2012 Myoview: EF 63%, no isch/infarct;  b. 04/2016 Lexiscan MV: EF 73%, no ischemia/infarct-->Low risk.  . CHF (congestive heart failure) ([place])   . Chronic diastolic CHF (congestive heart failure) ([place])    a. 2015 Echo: EF 55-60%, Gr2 DD;  b. 09/2015 Echo: EF 60-65%, no rwma, mod dil LA, PASP 32mHg.  .Marland KitchenHistory of thyrotoxicosis   . Hypertensive heart disease 10/18/2013  . Insulin dependent type 2 diabetes mellitus, uncontrolled ([place]   . Mediastinal adenopathy   . MI (myocardial infarction) ([place]   . Morbid obesity with BMI of 50.0-59.9, adult ([place]   . OSA (obstructive sleep apnea) 03/06/2011    Patient Active Problem List   Diagnosis Date Noted  . Uncontrolled type 2 diabetes mellitus with hyperglycemia (HSouth [place]   . Hypokalemia   . Acute on chronic respiratory failure with hypoxia (HL[place] L[place]   . Cor pulmonale (chronic) ([place]   . Acute on chronic diastolic CHF  (congestive heart failure) (H[place] 08/03/2017  . Mycobacterium avium complex ([place] 12/13/2015  . Pyrexia   . Dyspnea 11/13/2015  . Mediastinal adenopathy 11/13/2015  . Acute on chronic diastolic heart failure (HP[place] 11/13/2015  . Abnormal CT scan, chest 11/11/2015  . Chest pain 11/11/2015  . Atypical chest pain 11/11/2015  . Acute diastolic CHF (congestive heart failure) ([place] 10/03/2015  . Essential hypertension 03/07/2015  . Depression (emotion) 03/07/2015  . Noninfectious gastroenteritis and colitis 01/02/2015  . Sinusitis, chronic 01/02/2015  . Midline low back pain without sciatica 09/10/2014  . Bipolar 1 disorder, mixed, moderate (H[place] 07/02/2014  . Stress incontinence 07/02/2014  . Mania (HG[place] 12/10/2013  . Speech abnormality 12/08/2013  . SVT (supraventricular tachycardia) (HP[place] 12/06/2013  . Benign essential HTN 11/28/2013  . HTN (hypertension) 11/28/2013  . Diabetes (H[place] 11/28/2013  . Pulmonary HTN, moderate to severe 11/03/2013  . Acute on chronic diastolic congestive heart failure (H[place] 11/02/2013  . Hypertensive heart disease 10/18/2013  . IDDM (insulin dependent diabetes mellitus) (H[place] 10/18/2013  . Chronic diastolic heart failure (HS[place] 07/23/2011  . OSA (obstructive sleep apnea)- non compliant with C-pap 03/06/2011  . Morbid obesity due to excess calories (HBry[place] 02/19/2011  . Bipolar disorder     History reviewed. No pertinent surgical history.   OB History   None      Home Medications    Prior to Admission medications   Medication Sig Start Date End Date Taking? Authorizing Provider  amLODipine (NORVASC)  10 MG tablet Take 1 tablet (10 mg total) by mouth daily. 03/24/17  Yes Tresa Garter, MD  aspirin EC 81 MG EC tablet Take 1 tablet (81 mg total) daily by mouth. 08/14/17  Yes Barton Dubois, MD  carvedilol (COREG) 25 MG tablet Take 1 tablet (25 mg total) by mouth 2 (two) times daily. 04/12/17  Yes Lelon Perla, MD  Cholecalciferol (VITAMIN D-3)  1000 units CAPS Take 1,000 Units by mouth daily.   Yes [provider]  ferrous sulfate 325 (65 FE) MG tablet Take 1 tablet (325 mg total) 3 (three) times daily with meals by mouth. Patient taking differently: Take 325 mg by mouth daily with breakfast.  08/13/17  Yes Barton Dubois, MD  furosemide (LASIX) 80 MG tablet Take 1 tablet (80 mg total) by mouth daily. 08/26/17  Yes Strader, Anon Raices, PA-C  glipiZIDE (GLUCOTROL XL) 10 MG 24 hr tablet Take 1 tablet (10 mg total) by mouth daily with breakfast. 03/24/17  Yes Jegede, Olugbemiga E, MD  losartan (COZAAR) 100 MG tablet Take 1 tablet (100 mg total) by mouth daily. 03/24/17  Yes Tresa Garter, MD  metFORMIN (GLUCOPHAGE) 1000 MG tablet Take 1 tablet (1,000 mg total) by mouth 2 (two) times daily with a meal. 03/24/17  Yes Jegede, Olugbemiga E, MD  Multiple Vitamins-Calcium (ONE-A-DAY WOMENS PO) Take 1 tablet by mouth daily.   Yes [provider]  oxybutynin (DITROPAN XL) 15 MG 24 hr tablet TAKE 1 TABLET BY MOUTH AT BEDTIME. Patient taking differently: Take 15 mg by mouth at bedtime 06/03/17  Yes Jegede, Olugbemiga E, MD  potassium chloride (KLOR-CON M15) 15 MEQ tablet Take 2 tablets (30 mEq total) daily by mouth. 08/14/17  Yes Barton Dubois, MD  risperiDONE (RISPERDAL) 2 MG tablet TAKE 2 TABLETS BY MOUTH DAILY. Patient taking differently: Take 2 mg by mouth at bedtime 11/23/17  Yes Jegede, Olugbemiga E, MD  VENTOLIN HFA 108 (90 Base) MCG/ACT inhaler INHALE 1 TO 2 PUFFS EVERY 6 HOURS AS NEEDED FOR WHEEZING/ SHORTNESS OF BREATH 09/13/17  Yes Jegede, Olugbemiga E, MD  glucose blood test strip Use as instructed 01/06/17   Tresa Garter, MD  rivaroxaban (XARELTO) 20 MG TABS tablet Take 1 tablet (20 mg total) by mouth daily with supper. 12/23/17   Sherran Needs, NP    Family History Family History  Problem Relation Age of Onset  . Heart failure Father   . Stroke Father   . Hypertension Mother   . Heart disease Maternal  Grandfather   . Heart disease Paternal Grandfather     Social History Social History   Tobacco Use  . Smoking status: Never Smoker  . Smokeless tobacco: Never Used  Substance Use Topics  . Alcohol use: No  . Drug use: No     Allergies   Acetaminophen; Caffeine; Farxiga [dapagliflozin]; and Lisinopril   Review of Systems Review of Systems All other systems negative except as documented in the HPI. All pertinent positives and negatives as reviewed in the HPI.  Physical Exam Updated Vital Signs BP 117/79   Pulse 97   Temp 98.4 F (36.9 C) (Oral)   Resp (!) 23   SpO2 96%   Physical Exam  Constitutional: She is oriented to person, place, and time. She appears well-developed and well-nourished. No distress.  HENT:  Head: Normocephalic and atraumatic.  Mouth/Throat: Oropharynx is clear and moist.  Eyes: Pupils are equal, round, and reactive to light.  Neck: Normal range of motion.  Neck supple.  Cardiovascular: Normal rate, regular rhythm, normal heart sounds, intact distal pulses and normal pulses. Exam reveals no gallop and no friction rub.  No murmur heard. Pulmonary/Chest: Effort normal and breath sounds normal. No respiratory distress. She has no decreased breath sounds. She has no wheezes. She has no rhonchi. She has no rales.  Abdominal: Soft. Bowel sounds are normal. She exhibits no distension. There is no tenderness.  Musculoskeletal:       Right lower leg: She exhibits no edema.       Left lower leg: She exhibits no edema.  Neurological: She is alert and oriented to person, place, and time. She exhibits normal muscle tone. Coordination normal.  Skin: Skin is warm and dry. Capillary refill takes less than 2 seconds. No rash noted. No erythema.  Psychiatric: She has a normal mood and affect. Her behavior is normal.  Nursing note and vitals reviewed.    ED Treatments / Results  Labs (all labs ordered are listed, but only abnormal results are displayed) Labs  Reviewed  BASIC METABOLIC PANEL - Abnormal; Notable for the following components:      Result Value   Sodium 133 (*)    Chloride 97 (*)    Glucose, Bld 280 (*)    Calcium 8.7 (*)    All other components within normal limits  BRAIN NATRIURETIC PEPTIDE - Abnormal; Notable for the following components:   B Natriuretic Peptide 285.1 (*)    All other components within normal limits  CBC  I-STAT TROPONIN, ED  I-STAT BETA HCG BLOOD, ED ([facility], WL, AP ONLY)  I-STAT TROPONIN, ED    EKG EKG Interpretation  Date/Time:  Thursday December 23 2017 14:55:57 EDT Ventricular Rate:  104 PR Interval:    QRS Duration: 78 QT Interval:  348 QTC Calculation: 457 R Axis:   138 Text Interpretation:  Atrial fibrillation with rapid ventricular response Septal infarct , age undetermined Lateral infarct , age undetermined Abnormal ECG No significant change since last tracing Confirmed by Gareth Morgan [phone]) on 12/23/2017 9:28:00 PM   Radiology Dg Chest 2 View  Result Date: 12/23/2017 CLINICAL DATA:  Chest pain EXAM: CHEST - 2 VIEW COMPARISON:  Chest radiograph 12/09/2017 FINDINGS: There is unchanged cardiomegaly. No pulmonary edema or focal airspace consolidation. No pleural effusion or pneumothorax. IMPRESSION: Unchanged cardiomegaly without pulmonary edema. Electronically Signed   By: Ulyses Jarred M.D.   On: 12/23/2017 16:57   Ct Angio Chest Pe W/cm &/or Wo Cm  Result Date: 12/23/2017 CLINICAL DATA:  Shortness of breath and chest pain EXAM: CT ANGIOGRAPHY CHEST WITH CONTRAST TECHNIQUE: Multidetector CT imaging of the chest was performed using the standard protocol during bolus administration of intravenous contrast. Multiplanar CT image reconstructions and MIPs were obtained to evaluate the vascular anatomy. CONTRAST:  137m ISOVUE-370 IOPAMIDOL (ISOVUE-370) INJECTION 76% COMPARISON:  Chest CT August 05, 2017; chest radiograph December 24, 2017 FINDINGS: Cardiovascular: There is no demonstrable pulmonary  embolus. There is no thoracic aortic aneurysm or dissection. The visualized great vessels appear unremarkable. There is a rather minimal pericardial effusion. The pericardium does not appear thickened. The main pulmonary outflow tract measures 3.3 cm in diameter, a finding felt to be indicative of a degree of pulmonary arterial hypertension. Mediastinum/Nodes: Visualized thyroid appears unremarkable. There is no appreciable thoracic adenopathy. No esophageal lesions are evident. Lungs/Pleura: There is slight atelectatic change in the inferior lingula. There is no edema or consolidation. No pleural effusion or pleural thickening. Upper Abdomen: There is reflux of  contrast into the inferior vena cava and hepatic veins. Visualized upper abdominal structures otherwise appear unremarkable. Musculoskeletal: There are no blastic or lytic bone lesions. No chest wall lesions are appreciable. Review of the MIP images confirms the above findings. IMPRESSION: 1. No demonstrable pulmonary embolus. No thoracic aortic aneurysm or dissection. 2. Prominence of the main pulmonary outflow tract, a finding felt to be indicative of a degree of pulmonary arterial hypertension. 3. Reflux of contrast in the inferior vena cava and hepatic veins, a finding likely indicative of a degree of increased right heart pressure. 4.  Small pericardial effusion. 5. Slight lingular atelectasis. No edema or consolidation. No pleural effusion. 6.  No appreciable thoracic adenopathy. Electronically Signed   By: Lowella Grip III M.D.   On: 12/23/2017 21:53    Procedures Procedures (including critical care time)  Medications Ordered in ED Medications  iopamidol (ISOVUE-370) 76 % injection (100 mLs  Contrast Given 12/23/17 2044)     Initial Impression / Assessment and Plan / ED Course  I have reviewed the triage vital signs and the nursing notes.  Pertinent labs & imaging results that were available during my care of the patient were  reviewed by me and considered in my medical decision making (see chart for details).     Patient's chest discomfort is atypical and does not fit in line with her previous cardiac issues.  Patient states that it is some better at this point.  Patient will be advised to follow-up with her cardiologist told return here as needed patient agrees the plan and all questions were answered.  Final Clinical Impressions(s) / ED Diagnoses   Final diagnoses:  Chest pain, unspecified type    ED Discharge Orders    None       Dalia Heading, PA-C 12/24/17 0105    Gareth Morgan, MD 12/24/17 1401

## 2017-12-30 NOTE — Progress Notes (Signed)
HPI: Follow-up chest pain, chronic diastolic congestive heart failure, atrial fibrillation, obesity and hypertension. Patient has declined catheterization in the past. Last nuclear study August 2017 showed ejection fraction 73% and normal perfusion. Also with history of SVT. Echo 11/18 showed normal LV function, moderate LVH, grade 2 DD, biatrial enlargement, mild to moderate TR. CTA 3/19 showed no PE, small pericardial effusion and suggestion of pulmonary hypertension. Recently diagnosed with atrial fibrillation. Seen in ER with CP 3/19; troponin normal. Since last seen,  she continues to have intermittent chest pain which is chronic.  Mild dyspnea on exertion.  No orthopnea, PND or pedal edema.  No syncope.  She is not taking Xarelto at this time.   Current Outpatient Medications  Medication Sig Dispense Refill  . amLODipine (NORVASC) 10 MG tablet Take 1 tablet (10 mg total) by mouth daily. 90 tablet 3  . aspirin EC 81 MG EC tablet Take 1 tablet (81 mg total) daily by mouth. 30 tablet 1  . carvedilol (COREG) 25 MG tablet Take 1 tablet (25 mg total) by mouth 2 (two) times daily. 180 tablet 3  . Cholecalciferol (VITAMIN D-3) 1000 units CAPS Take 1,000 Units by mouth daily.    . ferrous sulfate 325 (65 FE) MG tablet Take 1 tablet (325 mg total) 3 (three) times daily with meals by mouth. (Patient taking differently: Take 325 mg by mouth daily with breakfast. ) 90 tablet 2  . furosemide (LASIX) 80 MG tablet Take 1 tablet (80 mg total) by mouth daily. 30 tablet 5  . glipiZIDE (GLUCOTROL XL) 10 MG 24 hr tablet Take 1 tablet (10 mg total) by mouth daily with breakfast. 90 tablet 3  . glucose blood test strip Use as instructed 100 each 12  . losartan (COZAAR) 100 MG tablet Take 1 tablet (100 mg total) by mouth daily. 90 tablet 3  . metFORMIN (GLUCOPHAGE) 1000 MG tablet Take 1 tablet (1,000 mg total) by mouth 2 (two) times daily with a meal. 180 tablet 3  . Multiple Vitamins-Calcium (ONE-A-DAY  WOMENS PO) Take 1 tablet by mouth daily.    Marland Kitchen oxybutynin (DITROPAN XL) 15 MG 24 hr tablet TAKE 1 TABLET BY MOUTH AT BEDTIME. (Patient taking differently: Take 15 mg by mouth at bedtime) 30 tablet 3  . potassium chloride (KLOR-CON M15) 15 MEQ tablet Take 2 tablets (30 mEq total) daily by mouth. 60 tablet 1  . risperiDONE (RISPERDAL) 2 MG tablet TAKE 2 TABLETS BY MOUTH DAILY. (Patient taking differently: Take 2 mg by mouth at bedtime) 60 tablet 3  . VENTOLIN HFA 108 (90 Base) MCG/ACT inhaler INHALE 1 TO 2 PUFFS EVERY 6 HOURS AS NEEDED FOR WHEEZING/ SHORTNESS OF BREATH 18 g 1  . rivaroxaban (XARELTO) 20 MG TABS tablet Take 1 tablet (20 mg total) by mouth daily with supper. (Patient not taking: Reported on 01/04/2018) 30 tablet 0   No current facility-administered medications for this visit.      Past Medical History:  Diagnosis Date  . Bipolar disease, chronic ([place])   . Chest pain    a. 2012 Myoview: EF 63%, no isch/infarct;  b. 04/2016 Lexiscan MV: EF 73%, no ischemia/infarct-->Low risk.  . CHF (congestive heart failure) ([place])   . Chronic diastolic CHF (congestive heart failure) ([place])    a. 2015 Echo: EF 55-60%, Gr2 DD;  b. 09/2015 Echo: EF 60-65%, no rwma, mod dil LA, PASP 54mmHg.  Marland Kitchen History of thyrotoxicosis   . Hypertensive heart disease 10/18/2013  .  Insulin dependent type 2 diabetes mellitus, uncontrolled ([place])   . Mediastinal adenopathy   . MI (myocardial infarction) ([place])   . Morbid obesity with BMI of 50.0-59.9, adult ([place])   . OSA (obstructive sleep apnea) 03/06/2011    History reviewed. No pertinent surgical history.  Social History   Socioeconomic History  . Marital status: Single    Spouse name: Not on file  . Number of children: 0  . Years of education: 37  . Highest education level: Not on file  Occupational History  . Occupation: unemployed  Social Needs  . Financial resource strain: Not on file  . Food insecurity:    Worry: Not on file    Inability: Not on file  .  Transportation needs:    Medical: Not on file    Non-medical: Not on file  Tobacco Use  . Smoking status: Never Smoker  . Smokeless tobacco: Never Used  Substance and Sexual Activity  . Alcohol use: No  . Drug use: No  . Sexual activity: Not Currently    Birth control/protection: None  Lifestyle  . Physical activity:    Days per week: Not on file    Minutes per session: Not on file  . Stress: Not on file  Relationships  . Social connections:    Talks on phone: Not on file    Gets together: Not on file    Attends religious service: Not on file    Active member of club or organization: Not on file    Attends meetings of clubs or organizations: Not on file    Relationship status: Not on file  . Intimate partner violence:    Fear of current or ex partner: Not on file    Emotionally abused: Not on file    Physically abused: Not on file    Forced sexual activity: Not on file  Other Topics Concern  . 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 But ran out of money and is no longer attending school. (Note patient has bipolar disorder).      Born in [place] but lived in [place] before coming back to [place].       Primary language is Arabic. Lives with mother and brother.             Family History  Problem Relation Age of Onset  . Heart failure Father   . Stroke Father   . Hypertension Mother   . Heart disease Maternal Grandfather   . Heart disease Paternal Grandfather     ROS: no fevers or chills, productive cough, hemoptysis, dysphasia, odynophagia, melena, hematochezia, dysuria, hematuria, rash, seizure activity, orthopnea, PND, pedal edema, claudication. Remaining systems are negative.  Physical Exam: Well-developed obese in no acute distress.  Skin is warm and dry.  HEENT is normal.  Neck is supple.  Chest is clear to auscultation with normal expansion.    Cardiovascular exam is irregular Abdominal exam nontender or distended. No masses palpated. Extremities show no edema. neuro grossly intact  ECG-atrial fibrillation at a rate of 106.  Left posterior fascicular block.  Personally reviewed  A/P  1 chest pain-patient continues to have chest pain which has been a chronic issue.  Previous nuclear study showed no ischemia.  We again discussed cardiac catheterizations she states she would be agreeable when her mother comes to town; will review  at next ov.  Plan continue medical therapy.  2 chronic diastolic congestive heart failure-patient appears to be euvolemic.  Continue present dose of Lasix.  Follow low-sodium diet and fluid restriction.  3 morbid obesity-we again discussed the importance of weight loss.  4 hypertension-blood pressure is elevated but she has not taken her medications yet this morning.  Continue present regimen and follow.  5 diabetes mellitus-management per primary care.  6 PAF-patient remains in atrial fibrillation.  She has not taken Xarelto yet.  I discussed the importance of anticoagulation and the risk of stroke if not compliant with Xarelto.  She will began 20 mg daily.  I will have her seen in 3-4 weeks and if she is compliant we will arrange cardioversion at that time.  If she does not hold sinus rhythm rate control and anticoagulation would likely be appropriate.  [name], MD

## 2018-01-04 ENCOUNTER — Encounter: Payer: Self-pay | Admitting: Cardiology

## 2018-01-04 ENCOUNTER — Ambulatory Visit (INDEPENDENT_AMBULATORY_CARE_PROVIDER_SITE_OTHER): Payer: No Typology Code available for payment source | Admitting: Cardiology

## 2018-01-04 VITALS — BP 142/100 | HR 106 | Ht 67.0 in | Wt 331.0 lb

## 2018-01-04 DIAGNOSIS — R072 Precordial pain: Secondary | ICD-10-CM

## 2018-01-04 NOTE — Patient Instructions (Signed)
Your physician recommends that you schedule a follow-up appointment in: APP IN 4 WEEKS  If you need a refill on your cardiac medications before your next appointment, please call your pharmacy.

## 2018-02-01 MED FILL — risperiDONE 2 MG TABS: 2 | 30 days supply | Qty: 60 | Fill #1

## 2018-02-01 MED FILL — glipiZIDE XL 10 MG TB24: 10 | 30 days supply | Qty: 30 | Fill #4

## 2018-02-01 MED FILL — ?FUROSEMIDE 80MG TABLET: 80 | 30 days supply | Qty: 30 | Fill #4

## 2018-02-01 MED FILL — !VENTOLIN HFA INHALER: 108 (90 BAS | 25 days supply | Qty: 18 | Fill #1

## 2018-02-01 MED FILL — ?CARVEDILOL 25 MG TABLET: 25 | 30 days supply | Qty: 60 | Fill #4

## 2018-02-01 MED FILL — LOSARTAN POTASSIUM 100 MG T: 100 | 30 days supply | Qty: 30 | Fill #4

## 2018-02-01 MED FILL — ?METFORMIN HCL 1,000 MG TAB: 1000 | 30 days supply | Qty: 60 | Fill #5

## 2018-02-08 ENCOUNTER — Ambulatory Visit (INDEPENDENT_AMBULATORY_CARE_PROVIDER_SITE_OTHER): Payer: No Typology Code available for payment source | Admitting: Physician Assistant

## 2018-02-08 ENCOUNTER — Encounter: Payer: Self-pay | Admitting: Physician Assistant

## 2018-02-08 VITALS — BP 150/102 | HR 104 | Ht 67.0 in | Wt 335.6 lb

## 2018-02-08 DIAGNOSIS — Z7901 Long term (current) use of anticoagulants: Secondary | ICD-10-CM

## 2018-02-08 DIAGNOSIS — I5032 Chronic diastolic (congestive) heart failure: Secondary | ICD-10-CM

## 2018-02-08 DIAGNOSIS — I481 Persistent atrial fibrillation: Secondary | ICD-10-CM

## 2018-02-08 DIAGNOSIS — I4819 Other persistent atrial fibrillation: Secondary | ICD-10-CM

## 2018-02-08 DIAGNOSIS — R079 Chest pain, unspecified: Secondary | ICD-10-CM

## 2018-02-08 LAB — BASIC METABOLIC PANEL
Anion gap: 11 (ref 5–15)
BUN: 8 mg/dL (ref 6–20)
CALCIUM: 9.4 mg/dL (ref 8.9–10.3)
CHLORIDE: 99 mmol/L — AB (ref 101–111)
CO2: 24 mmol/L (ref 22–32)
CREATININE: 0.65 mg/dL (ref 0.44–1.00)
GFR calc Af Amer: >60 mL/min (ref >60)
GFR calc non Af Amer: >60 mL/min (ref >60)
Glucose, Bld: 256 mg/dL — ABNORMAL HIGH (ref 65–99)
Potassium: 3.9 mmol/L (ref 3.5–5.1)
SODIUM: 134 mmol/L — AB (ref 135–145)

## 2018-02-08 LAB — CBC
HCT: 40.8 % (ref 36.0–46.0)
Hemoglobin: 12.8 g/dL (ref 12.0–15.0)
MCH: 27 pg (ref 26.0–34.0)
MCHC: 31.4 g/dL (ref 30.0–36.0)
MCV: 86.1 fL (ref 78.0–100.0)
Platelets: 268 10*3/uL (ref 150–400)
RBC: 4.74 MIL/uL (ref 3.87–5.11)
RDW: 13.7 % (ref 11.5–15.5)
WBC: 5.5 10*3/uL (ref 4.0–10.5)

## 2018-02-08 LAB — TROPONIN I

## 2018-02-08 NOTE — Patient Instructions (Addendum)
MEDICATION INSTRUCTIONS  NO MEDICATION CHANGE      LABS - PLEASE GO TO REGISTRATION AT [facility]ORAT[facility]   TROPININ I CBC BMP   You have been referred to [facility]. SHE WILL CONTACT YOU . THIS IS A FREE SERVICE.   Your physician recommends that you schedule a follow-up appointment in 2 TO [place].

## 2018-02-08 NOTE — Progress Notes (Signed)
Cardiology Office Note   Date:  02/08/2018   ID:  [name] December 06, 1965, MRN [id number]  PCP:  Tresa Garter, MD  Cardiologist: Dr. [name], 01/04/2018 [name], PA-C   No chief complaint on file.   History of Present Illness: [name] is a 52 y.o. female with a history of D-CHF, Afib, HTN, obesity, MV 2017 w/ nl perfusion and EF 73%, SVT, chronic intermittent chest pain  01/04/2018 office visit, patient considering cardiac catheterization when her mother comes to town, review at next office visit, continue medical therapy.  Euvolemic with weight 331 pounds, in atrial fibrillation, had not started on Xarelto.  Asked to start Xarelto and discuss cardioversion at next visit.  [name] presents for cardiology follow up.  She does not weigh daily, does not like the numbers. She does not follow a low-sodium diabetic diet.   She gets SOB w/ exertion. However, it is better than it was last month.   She has chest heaviness with exertion. She gets it when she goes up steps or moves quickly. She feels she can walk > 200 feet on flat ground, but cannot climb stairs without getting the pain. She gets the pain almost every day.  She is having chest pain at 6/10 now. The current episode started last night about 11 pm, at rest. Has waxed and waned, but is still there. The pain is worse with deep inspiration. It is a heaviness and a stabbing. Denies indigestion or heartburn. She ate last pm after sunset, a very large meal according to her mother.  The pain began after she ate.  She has not tried any medications for the pain.  She has not had blood pressure medications today.  It is Ramadan, and she did not take them before sunrise so is not going to be able to take them until after Sunset.  However, she is consistently missing her a.m. dose of carvedilol.  She gets diarrhea after she takes her morning and evening medications.  She has not told her PCP about this.  She  has missed some doses of Xarelto.  She does not think she has missed more than 2 or 3, but is not sure exactly when.  She is not sure she wants to have a cardioversion at all.   Past Medical History:  Diagnosis Date  . Bipolar disease, chronic ([place])   . Chest pain    a. 2012 Myoview: EF 63%, no isch/infarct;  b. 04/2016 Lexiscan MV: EF 73%, no ischemia/infarct-->Low risk.  . CHF (congestive heart failure) ([place])   . Chronic diastolic CHF (congestive heart failure) ([place])    a. 2015 Echo: EF 55-60%, Gr2 DD;  b. 09/2015 Echo: EF 60-65%, no rwma, mod dil LA, PASP 63mmHg.  Marland Kitchen History of thyrotoxicosis   . Hypertensive heart disease 10/18/2013  . Insulin dependent type 2 diabetes mellitus, uncontrolled ([place])   . Mediastinal adenopathy   . MI (myocardial infarction) ([place])   . Morbid obesity with BMI of 50.0-59.9, adult ([place])   . OSA (obstructive sleep apnea) 03/06/2011    No past surgical history on file.  Current Outpatient Medications  Medication Sig Dispense Refill  . amLODipine (NORVASC) 10 MG tablet Take 1 tablet (10 mg total) by mouth daily. 90 tablet 3  . carvedilol (COREG) 25 MG tablet Take 1 tablet (25 mg total) by mouth 2 (two) times daily. 180 tablet 3  . Cholecalciferol (VITAMIN D-3) 1000 units CAPS Take 1,000 Units by mouth daily.    Marland Kitchen  ferrous sulfate 325 (65 FE) MG tablet Take 1 tablet (325 mg total) 3 (three) times daily with meals by mouth. (Patient taking differently: Take 325 mg by mouth daily with breakfast. ) 90 tablet 2  . furosemide (LASIX) 80 MG tablet Take 1 tablet (80 mg total) by mouth daily. 30 tablet 5  . glipiZIDE (GLUCOTROL XL) 10 MG 24 hr tablet Take 1 tablet (10 mg total) by mouth daily with breakfast. 90 tablet 3  . glucose blood test strip Use as instructed 100 each 12  . losartan (COZAAR) 100 MG tablet Take 1 tablet (100 mg total) by mouth daily. 90 tablet 3  . metFORMIN (GLUCOPHAGE) 1000 MG tablet Take 1 tablet (1,000 mg total) by mouth 2 (two) times daily with a  meal. 180 tablet 3  . Multiple Vitamins-Calcium (ONE-A-DAY WOMENS PO) Take 1 tablet by mouth daily.    Marland Kitchen oxybutynin (DITROPAN XL) 15 MG 24 hr tablet TAKE 1 TABLET BY MOUTH AT BEDTIME. (Patient taking differently: Take 15 mg by mouth at bedtime) 30 tablet 3  . potassium chloride (KLOR-CON M15) 15 MEQ tablet Take 2 tablets (30 mEq total) daily by mouth. 60 tablet 1  . risperiDONE (RISPERDAL) 2 MG tablet TAKE 2 TABLETS BY MOUTH DAILY. (Patient taking differently: Take 2 mg by mouth at bedtime) 60 tablet 3  . rivaroxaban (XARELTO) 20 MG TABS tablet Take 1 tablet (20 mg total) by mouth daily with supper. 30 tablet 0  . VENTOLIN HFA 108 (90 Base) MCG/ACT inhaler INHALE 1 TO 2 PUFFS EVERY 6 HOURS AS NEEDED FOR WHEEZING/ SHORTNESS OF BREATH 18 g 1   No current facility-administered medications for this visit.     Allergies:   Acetaminophen; Caffeine; Farxiga [dapagliflozin]; and Lisinopril    Social History:  The patient  reports that she has never smoked. She has never used smokeless tobacco. She reports that she does not drink alcohol or use drugs.   Family History:  The patient's family history includes Heart disease in her maternal grandfather and paternal grandfather; Heart failure in her father; Hypertension in her mother; Stroke in her father.    ROS:  Please see the history of present illness. All other systems are reviewed and negative.    PHYSICAL EXAM: VS:  BP (!) 150/102   Pulse (!) 104   Ht 5\' 7"  (1.702 m)   Wt (!) 335 lb 9.6 oz (152.2 kg)   BMI 52.56 kg/m  , BMI Body mass index is 52.56 kg/m. GEN: Well nourished, well developed, female in no acute distress  HEENT: normal for age  Neck: no JVD, no carotid bruit, no masses Cardiac: RRR; no murmur, no rubs, or gallops Respiratory: Decreased breath sounds bases bilaterally, normal work of breathing GI: soft, nontender, nondistended, + BS MS: no deformity or atrophy; trace edema; distal pulses are 2+ in all 4 extremities   Skin:  warm and dry, no rash Neuro:  Strength and sensation are intact Psych: euthymic mood, full affect   EKG:  EKG is ordered today. The ekg ordered today demonstrates atrial fibrillation, heart rate 104, no acute ischemic changes.  No change from 01/04/2018 ECG   Recent Labs: 08/03/2017: ALT 11 08/24/2017: Magnesium 1.6 12/09/2017: TSH 2.910 12/23/2017: B Natriuretic Peptide 285.1 02/08/2018: BUN 8; Creatinine, Ser 0.65; Hemoglobin 12.8; Platelets 268; Potassium 3.9; Sodium 134    Lipid Panel    Component Value Date/Time   CHOL 173 08/06/2016 1245   TRIG 212 (H) 08/06/2016 1245   HDL 45 (  L) 08/06/2016 1245   CHOLHDL 3.8 08/06/2016 1245   VLDL 42 (H) 08/06/2016 1245   LDLCALC 86 08/06/2016 1245     Wt Readings from Last 3 Encounters:  02/08/18 (!) 335 lb 9.6 oz (152.2 kg)  01/04/18 (!) 331 lb (150.1 kg)  12/23/17 (!) 335 lb (152 kg)     Other studies Reviewed: Additional studies/ records that were reviewed today include: Office notes, hospital records and testing.  ASSESSMENT AND PLAN:  1.  Persistent atrial fibrillation: I discussed cardioversion with the patient and her mother.  I explained that she would have to be compliant with the Xarelto for at least 3 weeks before we could consider it.  She wonders if it has to be done.  I advised it did not have to be done but we thought her breathing might get better if she were back in sinus rhythm.  - For now, continue carvedilol 25 mg twice daily.  - She is encouraged to set an alarm clock so that she can get up before sunrise and take her morning medications.  2.  Anticoagulation: CHA2DS2-VASc= 3 (female, HTN, CHF). -Compliance with Xarelto was encouraged, it was emphasized that she could not have a cardioversion unless she and not missed any doses for at least 3 weeks -Patient reports coughing up blood first thing in the morning for 3 days after she started the Xarelto.  This resolved. -Check a CBC  3.  Chronic diastolic CHF:  Starting January 2019, her weight has been 335 pounds much of the time, range 331-340 pounds she is encouraged to do daily weights.  - She is referred to the health care guide to encourage compliance with a low-sodium diet and to encourage weight loss -Check a BMET  4.  Chest pain: Testing in the past has been negative for CAD.  Symptoms began after eating.  She may be having some pleuritic chest pain related to shortness of breath with exertion.  She may also be having some symptoms from reflux after eating overly large meals.  - Patient discussed with Dr. Oval Linsey.  We will check a troponin today and if that is negative despite prolonged chest pain of greater than 12 hours on top of daily symptoms, no immediate need for testing.  Current medicines are reviewed at length with the patient today.  The patient does not have concerns regarding medicines.  The following changes have been made:  no change  Labs/ tests ordered today include:   Orders Placed This Encounter  Procedures  . CBC  . Basic metabolic panel  . Troponin I     Disposition:   FU with Dr. [name]  Signed, [name], PA-C  02/08/2018 4:08 PM    [place] Phone: [phone]; Fax: [phone]  This note was written with the assistance of speech recognition software. Please excuse any transcriptional errors.

## 2018-02-11 NOTE — Addendum Note (Signed)
Addended by: Vennie Homans on: 02/11/2018 10:48 AM   Modules accepted: Orders

## 2018-02-16 ENCOUNTER — Telehealth: Payer: Self-pay

## 2018-02-16 NOTE — Telephone Encounter (Signed)
Called to schedule initial visit. Left voicemail

## 2018-02-23 ENCOUNTER — Ambulatory Visit (INDEPENDENT_AMBULATORY_CARE_PROVIDER_SITE_OTHER): Payer: No Typology Code available for payment source | Admitting: Physician Assistant

## 2018-02-23 ENCOUNTER — Encounter: Payer: Self-pay | Admitting: Physician Assistant

## 2018-02-23 VITALS — BP 116/73 | HR 98 | Ht 67.0 in | Wt 351.0 lb

## 2018-02-23 DIAGNOSIS — I4819 Other persistent atrial fibrillation: Secondary | ICD-10-CM

## 2018-02-23 DIAGNOSIS — Z01812 Encounter for preprocedural laboratory examination: Secondary | ICD-10-CM

## 2018-02-23 DIAGNOSIS — G8929 Other chronic pain: Secondary | ICD-10-CM

## 2018-02-23 DIAGNOSIS — R079 Chest pain, unspecified: Secondary | ICD-10-CM

## 2018-02-23 DIAGNOSIS — I5033 Acute on chronic diastolic (congestive) heart failure: Secondary | ICD-10-CM

## 2018-02-23 DIAGNOSIS — I481 Persistent atrial fibrillation: Secondary | ICD-10-CM

## 2018-02-23 DIAGNOSIS — I10 Essential (primary) hypertension: Secondary | ICD-10-CM

## 2018-02-23 NOTE — Progress Notes (Signed)
Cardiology Office Note   Date:  02/23/2018   ID:  [name], [name] Sep 05, 1966, MRN [id number]  PCP:  Tresa Garter, MD  Cardiologist: Dr. [name], 01/04/2018 Vanessa Ferries, PA-C 02/08/2018  Chief Complaint  Patient presents with  . Follow-up    History of Present Illness: [name] is a 52 y.o. female with a history of D-CHF, persistent Afib, HTN, obesity, MV 2017 w/ nl perfusion and EF 73%, SVT, chronic intermittent chest pain, CHA2DS2-VASc= 3 (female, HTN, CHF) on Eliquis  5/14 office visit, patient not on a low-sodium diabetic diet, compliance encouraged, patient in atrial fibrillation, compliance with Xarelto for 3 weeks encouraged prior to cardioversion, on carvedilol 25 mg twice daily, referred to health care guide, chest pain possibly secondary to reflux, troponin was negative  [name] presents for cardiology follow up.  Her mother is with her today.  She still feels bad. She is SOB with any activity.  According to her mother, she eats and sits.  She does not do much walking around the store or anything else because of the shortness of breath.  She is not waking up with lower extremity edema.  She describes orthopnea, she is at high risk to have obesity hypoventilation syndrome so PND is not clear.  When walking approximately 200 feet from the lobby back to the exam room, she had to stop twice to catch her breath.  She feels that she is limiting the salt in the foods that she eats and cooks mostly of her food.  She eats a lot of native and ethnic foods, so is not eating a great deal of prepared foods.  She drinks a great deal of water daily, she thinks it is over 3 L.  She has not missed any doses of Xarelto.  She wants to get out of atrial fibrillation, she is willing to do the cardioversion.  She has chest pain most of the time. It hurts worse with deep inspiration.  She has chest wall tenderness.   Past Medical History:  Diagnosis Date  . Bipolar  disease, chronic ([place])   . Chest pain    a. 2012 Myoview: EF 63%, no isch/infarct;  b. 04/2016 Lexiscan MV: EF 73%, no ischemia/infarct-->Low risk.  . CHF (congestive heart failure) ([place])   . Chronic diastolic CHF (congestive heart failure) ([place])    a. 2015 Echo: EF 55-60%, Gr2 DD;  b. 09/2015 Echo: EF 60-65%, no rwma, mod dil LA, PASP 10mmHg.  Marland Kitchen History of thyrotoxicosis   . Hypertensive heart disease 10/18/2013  . Insulin dependent type 2 diabetes mellitus, uncontrolled ([place])   . Mediastinal adenopathy   . MI (myocardial infarction) ([place])   . Morbid obesity with BMI of 50.0-59.9, adult ([place])   . OSA (obstructive sleep apnea) 03/06/2011    History reviewed. No pertinent surgical history.  Current Outpatient Medications  Medication Sig Dispense Refill  . amLODipine (NORVASC) 10 MG tablet Take 1 tablet (10 mg total) by mouth daily. 90 tablet 3  . carvedilol (COREG) 25 MG tablet Take 1 tablet (25 mg total) by mouth 2 (two) times daily. 180 tablet 3  . Cholecalciferol (VITAMIN D-3) 1000 units CAPS Take 1,000 Units by mouth daily.    . ferrous sulfate 325 (65 FE) MG tablet Take 1 tablet (325 mg total) 3 (three) times daily with meals by mouth. (Patient taking differently: Take 325 mg by mouth daily with breakfast. ) 90 tablet 2  . furosemide (LASIX) 80 MG tablet Take 1  tablet (80 mg total) by mouth daily. 30 tablet 5  . glipiZIDE (GLUCOTROL XL) 10 MG 24 hr tablet Take 1 tablet (10 mg total) by mouth daily with breakfast. 90 tablet 3  . glucose blood test strip Use as instructed 100 each 12  . losartan (COZAAR) 100 MG tablet Take 1 tablet (100 mg total) by mouth daily. 90 tablet 3  . metFORMIN (GLUCOPHAGE) 1000 MG tablet Take 1 tablet (1,000 mg total) by mouth 2 (two) times daily with a meal. 180 tablet 3  . Multiple Vitamins-Calcium (ONE-A-DAY WOMENS PO) Take 1 tablet by mouth daily.    Marland Kitchen oxybutynin (DITROPAN XL) 15 MG 24 hr tablet TAKE 1 TABLET BY MOUTH AT BEDTIME. (Patient taking differently:  Take 15 mg by mouth at bedtime) 30 tablet 3  . potassium chloride (KLOR-CON M15) 15 MEQ tablet Take 2 tablets (30 mEq total) daily by mouth. 60 tablet 1  . risperiDONE (RISPERDAL) 2 MG tablet TAKE 2 TABLETS BY MOUTH DAILY. (Patient taking differently: Take 2 mg by mouth at bedtime) 60 tablet 3  . rivaroxaban (XARELTO) 20 MG TABS tablet Take 1 tablet (20 mg total) by mouth daily with supper. 30 tablet 0  . VENTOLIN HFA 108 (90 Base) MCG/ACT inhaler INHALE 1 TO 2 PUFFS EVERY 6 HOURS AS NEEDED FOR WHEEZING/ SHORTNESS OF BREATH 18 g 1   No current facility-administered medications for this visit.     Allergies:   Acetaminophen; Caffeine; Farxiga [dapagliflozin]; and Lisinopril    Social History:  The patient  reports that she has never smoked. She has never used smokeless tobacco. She reports that she does not drink alcohol or use drugs.   Family Status: The patient indicated that her mother is alive. She indicated that her father is deceased. She reported the following about her brother: HTN. She indicated that the status of her maternal grandfather is unknown. She indicated that the status of her paternal grandfather is unknown.  Family History:  The patient's family history includes Heart disease in her maternal grandfather and paternal grandfather; Heart failure in her father; Hypertension in her mother; Stroke in her father.    ROS:  Please see the history of present illness. All other systems are reviewed and negative.    PHYSICAL EXAM: VS:  BP 116/73   Pulse 98   Ht 5\' 7"  (1.702 m)   Wt (!) 351 lb (159.2 kg)   SpO2 97%   BMI 54.97 kg/m  , BMI Body mass index is 54.97 kg/m. GEN: Well nourished, well developed, female in no acute distress  HEENT: normal for age  Neck: no JVD seen, difficult to assess secondary to body habitus, no carotid bruit, no masses Cardiac: Irregular rate and rhythm; no murmur, no rubs, or gallops Respiratory: Slightly decreased breath sounds bases  bilaterally, normal work of breathing GI: soft, nontender, nondistended, + BS MS: no deformity or atrophy; no edema; distal pulses are 2+ in all 4 extremities   Skin: warm and dry, no rash Neuro:  Strength and sensation are intact Psych: euthymic mood, full affect   EKG:  EKG is not ordered today.  Recent Labs: 08/03/2017: ALT 11 08/24/2017: Magnesium 1.6 12/09/2017: TSH 2.910 12/23/2017: B Natriuretic Peptide 285.1 02/08/2018: BUN 8; Creatinine, Ser 0.65; Hemoglobin 12.8; Platelets 268; Potassium 3.9; Sodium 134    Lipid Panel    Component Value Date/Time   CHOL 173 08/06/2016 1245   TRIG 212 (H) 08/06/2016 1245   HDL 45 (L) 08/06/2016 1245  CHOLHDL 3.8 08/06/2016 1245   VLDL 42 (H) 08/06/2016 1245   LDLCALC 86 08/06/2016 1245     Wt Readings from Last 3 Encounters:  02/23/18 (!) 351 lb (159.2 kg)  02/08/18 (!) 335 lb 9.6 oz (152.2 kg)  01/04/18 (!) 331 lb (150.1 kg)     Other studies Reviewed: Additional studies/ records that were reviewed today include: Office notes, hospital records and testing.  ASSESSMENT AND PLAN:  1.  Persistent atrial fibrillation: She has not missed a dose of Xarelto for 3 weeks -She is interested in the cardioversion, but wants Dr. [name] to perform it. -We will schedule it at his first available opportunity, with anesthesia -Continue Xarelto, the importance of compliance was reinforced, samples given but it was emphasized to her that we cannot supply all of her Xarelto -Continue carvedilol twice daily -Check labs prior to the cardioversion and get an ECG at that time  2.  Acute on chronic diastolic CHF: She does not have lower extremity edema or crackles on exam, but neck veins are difficult to assess and she could be holding some fluid in her abdomen. -Her weight is up 15 pounds in 2 weeks -Increase Lasix to 80 mg twice daily for 5 days  -Compliance with 2 L or less of all liquids daily and a low-sodium diet is emphasized -Daily weights  are encouraged - Arrangements were made with the health coach, she will start following with her. -She is encouraged to photograph her plates of food before she eats, that may help [place] determine if better portion control as needed.  3.  Hypertension: Her blood pressure is well controlled on current therapy.  4.  Chronic chest pain: During her previous visit, her symptom description was more consistent with GI symptoms.  However, her current chest pain is associated with chest wall tenderness.  No further evaluation is indicated at this time.  Current medicines are reviewed at length with the patient today.  The patient has concerns regarding medicines.  Concerns were addressed  The following changes have been made:  no change  Labs/ tests ordered today include:   Orders Placed This Encounter  Procedures  . Basic metabolic panel  . CBC     Disposition:   FU with Dr. [name]  Signed, Vanessa Ferries, PA-C  02/23/2018 3:54 PM    [place] Phone: [phone]; Fax: [phone]  This note was written with the assistance of speech recognition software. Please excuse any transcriptional errors.

## 2018-02-23 NOTE — Patient Instructions (Addendum)
Medication Instructions:  INCREASE furosemide (Lasix) to 80 mg in the AM and 80 mg 6 hours later for 5 days INCREASE potassium to 30 meq (2 tablets) in the AM and 30 meq (2 tablets) 6 hours later x 5 days --then resume furosemide 80 mg daily and potassium 30 meq daily  Labwork: Please return for Labs 6/17 (BMET, CBC)  Our in office lab hours are Monday-Friday 8:00-4:00, closed for lunch 12:45-1:45 pm.  No appointment needed.  Testing/Procedures: Your physician has recommended that you have a Cardioversion (DCCV) June 21st with Dr. [name]. Electrical Cardioversion uses a jolt of electricity to your heart either through paddles or wired patches attached to your chest. This is a controlled, usually prescheduled, procedure. Defibrillation is done under light anesthesia in the hospital, and you usually go home the day of the procedure. This is done to get your heart back into a normal rhythm. You are not awake for the procedure. Please see the instruction sheet given to you today.  Follow-Up: EKG and labs with nurse Monday 6/17   1 week after cardioversion with [name] PA    Any Other Special Instructions Will Be Listed Below (If Applicable).   Limit all liquid intake to 2 liters a day Take pictures of your plate before you eat every meal Continue low sodium diet     You are scheduled for a Cardioversion on _____ with Dr. [name].  Please arrive at the [facility] (Main Entrance A) at [facility]: [address] [place], [place] [address] at ____ am/pm. (1 hour prior to procedure unless lab work is needed; if lab work is needed arrive 1.5 hours ahead)  DIET: Nothing to eat or drink after midnight except a sip of water with medications (see medication instructions below)  Medication Instructions: Hold METFORMIN and GLIPIZIDE morning of procedure  Continue your anticoagulant: Xarelto You will need to continue your anticoagulant after your procedure until you are told  by your  Provider that it is safe to stop   Labs: Come [facility] office on 6/17 for EKG and labwork   You must have a responsible person to drive you home and stay in the waiting area during your procedure. Failure to do so could result in cancellation.  Bring your insurance cards.  *Special Note: Every effort is made to have your procedure done on time. Occasionally there are emergencies that occur at the hospital that may cause delays. Please be patient if a delay does occur.     If you need a refill on your cardiac medications before your next appointment, please call your pharmacy.

## 2018-02-23 NOTE — H&P (Signed)
Cardiology Office Note   Date:  02/23/2018   ID:  Vanessa, [name] 12/18/1965, MRN [id number]  PCP:  Tresa Garter, MD  Cardiologist: Dr. [name], 01/04/2018 [name], PA-C 02/08/2018  Chief Complaint  Patient presents with  . Follow-up    History of Present Illness: [name] is a 52 y.o. female with a history of D-CHF, persistent Afib, HTN, obesity, MV 2017 w/ nl perfusion and EF 73%, SVT, chronic intermittent chest pain, CHA2DS2-VASc= 3 (female, HTN, CHF) on Eliquis  5/14 office visit, patient not on a low-sodium diabetic diet, compliance encouraged, patient in atrial fibrillation, compliance with Xarelto for 3 weeks encouraged prior to cardioversion, on carvedilol 25 mg twice daily, referred to health care guide, chest pain possibly secondary to reflux, troponin was negative  [name] presents for cardiology follow up.  Her mother is with her today.  She still feels bad. She is SOB with any activity.  According to her mother, she eats and sits.  She does not do much walking around the store or anything else because of the shortness of breath.  She is not waking up with lower extremity edema.  She describes orthopnea, she is at high risk to have obesity hypoventilation syndrome so PND is not clear.  When walking approximately 200 feet from the lobby back to the exam room, she had to stop twice to catch her breath.  She feels that she is limiting the salt in the foods that she eats and cooks mostly of her food.  She eats a lot of native and ethnic foods, so is not eating a great deal of prepared foods.  She drinks a great deal of water daily, she thinks it is over 3 L.  She has not missed any doses of Xarelto.  She wants to get out of atrial fibrillation, she is willing to do the cardioversion.  She has chest pain most of the time. It hurts worse with deep inspiration.  She has chest wall tenderness.   Past Medical History:  Diagnosis Date  . Bipolar  disease, chronic ([place])   . Chest pain    a. 2012 Myoview: EF 63%, no isch/infarct;  b. 04/2016 Lexiscan MV: EF 73%, no ischemia/infarct-->Low risk.  . CHF (congestive heart failure) ([place])   . Chronic diastolic CHF (congestive heart failure) ([place])    a. 2015 Echo: EF 55-60%, Gr2 DD;  b. 09/2015 Echo: EF 60-65%, no rwma, mod dil LA, PASP 8mmHg.  Marland Kitchen History of thyrotoxicosis   . Hypertensive heart disease 10/18/2013  . Insulin dependent type 2 diabetes mellitus, uncontrolled ([place])   . Mediastinal adenopathy   . MI (myocardial infarction) ([place])   . Morbid obesity with BMI of 50.0-59.9, adult ([place])   . OSA (obstructive sleep apnea) 03/06/2011    History reviewed. No pertinent surgical history.  Current Outpatient Medications  Medication Sig Dispense Refill  . amLODipine (NORVASC) 10 MG tablet Take 1 tablet (10 mg total) by mouth daily. 90 tablet 3  . carvedilol (COREG) 25 MG tablet Take 1 tablet (25 mg total) by mouth 2 (two) times daily. 180 tablet 3  . Cholecalciferol (VITAMIN D-3) 1000 units CAPS Take 1,000 Units by mouth daily.    . ferrous sulfate 325 (65 FE) MG tablet Take 1 tablet (325 mg total) 3 (three) times daily with meals by mouth. (Patient taking differently: Take 325 mg by mouth daily with breakfast. ) 90 tablet 2  . furosemide (LASIX) 80 MG tablet Take 1  tablet (80 mg total) by mouth daily. 30 tablet 5  . glipiZIDE (GLUCOTROL XL) 10 MG 24 hr tablet Take 1 tablet (10 mg total) by mouth daily with breakfast. 90 tablet 3  . glucose blood test strip Use as instructed 100 each 12  . losartan (COZAAR) 100 MG tablet Take 1 tablet (100 mg total) by mouth daily. 90 tablet 3  . metFORMIN (GLUCOPHAGE) 1000 MG tablet Take 1 tablet (1,000 mg total) by mouth 2 (two) times daily with a meal. 180 tablet 3  . Multiple Vitamins-Calcium (ONE-A-DAY WOMENS PO) Take 1 tablet by mouth daily.    Marland Kitchen oxybutynin (DITROPAN XL) 15 MG 24 hr tablet TAKE 1 TABLET BY MOUTH AT BEDTIME. (Patient taking differently:  Take 15 mg by mouth at bedtime) 30 tablet 3  . potassium chloride (KLOR-CON M15) 15 MEQ tablet Take 2 tablets (30 mEq total) daily by mouth. 60 tablet 1  . risperiDONE (RISPERDAL) 2 MG tablet TAKE 2 TABLETS BY MOUTH DAILY. (Patient taking differently: Take 2 mg by mouth at bedtime) 60 tablet 3  . rivaroxaban (XARELTO) 20 MG TABS tablet Take 1 tablet (20 mg total) by mouth daily with supper. 30 tablet 0  . VENTOLIN HFA 108 (90 Base) MCG/ACT inhaler INHALE 1 TO 2 PUFFS EVERY 6 HOURS AS NEEDED FOR WHEEZING/ SHORTNESS OF BREATH 18 g 1   No current facility-administered medications for this visit.     Allergies:   Acetaminophen; Caffeine; Farxiga [dapagliflozin]; and Lisinopril    Social History:  The patient  reports that she has never smoked. She has never used smokeless tobacco. She reports that she does not drink alcohol or use drugs.   Family Status: The patient indicated that her mother is alive. She indicated that her father is deceased. She reported the following about her brother: HTN. She indicated that the status of her maternal grandfather is unknown. She indicated that the status of her paternal grandfather is unknown.  Family History:  The patient's family history includes Heart disease in her maternal grandfather and paternal grandfather; Heart failure in her father; Hypertension in her mother; Stroke in her father.    ROS:  Please see the history of present illness. All other systems are reviewed and negative.    PHYSICAL EXAM: VS:  BP 116/73   Pulse 98   Ht 5\' 7"  (1.702 m)   Wt (!) 351 lb (159.2 kg)   SpO2 97%   BMI 54.97 kg/m  , BMI Body mass index is 54.97 kg/m. GEN: Well nourished, well developed, female in no acute distress  HEENT: normal for age  Neck: no JVD seen, difficult to assess secondary to body habitus, no carotid bruit, no masses Cardiac: Irregular rate and rhythm; no murmur, no rubs, or gallops Respiratory: Slightly decreased breath sounds bases  bilaterally, normal work of breathing GI: soft, nontender, nondistended, + BS MS: no deformity or atrophy; no edema; distal pulses are 2+ in all 4 extremities   Skin: warm and dry, no rash Neuro:  Strength and sensation are intact Psych: euthymic mood, full affect   EKG:  EKG is not ordered today.  Recent Labs: 08/03/2017: ALT 11 08/24/2017: Magnesium 1.6 12/09/2017: TSH 2.910 12/23/2017: B Natriuretic Peptide 285.1 02/08/2018: BUN 8; Creatinine, Ser 0.65; Hemoglobin 12.8; Platelets 268; Potassium 3.9; Sodium 134    Lipid Panel    Component Value Date/Time   CHOL 173 08/06/2016 1245   TRIG 212 (H) 08/06/2016 1245   HDL 45 (L) 08/06/2016 1245  CHOLHDL 3.8 08/06/2016 1245   VLDL 42 (H) 08/06/2016 1245   LDLCALC 86 08/06/2016 1245     Wt Readings from Last 3 Encounters:  02/23/18 (!) 351 lb (159.2 kg)  02/08/18 (!) 335 lb 9.6 oz (152.2 kg)  01/04/18 (!) 331 lb (150.1 kg)     Other studies Reviewed: Additional studies/ records that were reviewed today include: Office notes, hospital records and testing.  ASSESSMENT AND PLAN:  1.  Persistent atrial fibrillation: She has not missed a dose of Xarelto for 3 weeks -She is interested in the cardioversion, but wants Dr. [name] to perform it. -We will schedule it at his first available opportunity  -Continue Xarelto, the importance of compliance was reinforced, samples given but it was emphasized to her that we cannot supply all of her Xarelto -Continue carvedilol twice daily -Check labs prior to the cardioversion and get an ECG at that time  2.  Acute on chronic diastolic CHF: She does not have lower extremity edema or crackles on exam, but neck veins are difficult to assess and she could be holding some fluid in her abdomen. -Her weight is up 15 pounds in 2 weeks -Increase Lasix to 80 mg twice daily for 5 days  -Compliance with 2 L or less of all liquids daily and a low-sodium diet is emphasized -Daily weights are  encouraged - Arrangements were made with the health coach, she will start following with her. -She is encouraged to photograph her plates of food before she eats, that may help [place] determine if better portion control as needed.  Current medicines are reviewed at length with the patient today.  The patient has concerns regarding medicines.  Concerns were addressed  The following changes have been made:  no change  Labs/ tests ordered today include:   Orders Placed This Encounter  Procedures  . Basic metabolic panel  . CBC     Disposition:   FU with Dr. [name]  Signed, [name], PA-C  02/23/2018 3:54 PM    [place] Phone: [phone]; Fax: [phone]  This note was written with the assistance of speech recognition software. Please excuse any transcriptional errors.

## 2018-02-25 ENCOUNTER — Ambulatory Visit: Payer: No Typology Code available for payment source

## 2018-02-28 ENCOUNTER — Telehealth: Payer: Self-pay

## 2018-02-28 NOTE — Telephone Encounter (Signed)
Called to reschedule initial session. Left message

## 2018-03-09 NOTE — Telephone Encounter (Signed)
Called to schedule initial care guide visit. Pt was there with mother, scheduled for 6/13 at 2:00 pm

## 2018-03-10 ENCOUNTER — Ambulatory Visit: Payer: No Typology Code available for payment source

## 2018-03-11 ENCOUNTER — Ambulatory Visit: Payer: No Typology Code available for payment source

## 2018-03-14 ENCOUNTER — Ambulatory Visit (INDEPENDENT_AMBULATORY_CARE_PROVIDER_SITE_OTHER): Payer: No Typology Code available for payment source | Admitting: *Deleted

## 2018-03-14 DIAGNOSIS — Z01818 Encounter for other preprocedural examination: Secondary | ICD-10-CM

## 2018-03-14 DIAGNOSIS — I481 Persistent atrial fibrillation: Secondary | ICD-10-CM

## 2018-03-14 DIAGNOSIS — I4819 Other persistent atrial fibrillation: Secondary | ICD-10-CM

## 2018-03-14 DIAGNOSIS — R0600 Dyspnea, unspecified: Secondary | ICD-10-CM

## 2018-03-14 LAB — CBC
HEMOGLOBIN: 12.2 g/dL (ref 11.1–15.9)
Hematocrit: 38 % (ref 34.0–46.6)
MCH: 27.7 pg (ref 26.6–33.0)
MCHC: 32.1 g/dL (ref 31.5–35.7)
MCV: 86 fL (ref 79–97)
Platelets: 241 10*3/uL (ref 150–450)
RBC: 4.41 x10E6/uL (ref 3.77–5.28)
RDW: 14 % (ref 12.3–15.4)
WBC: 6.3 10*3/uL (ref 3.4–10.8)

## 2018-03-14 LAB — BASIC METABOLIC PANEL
BUN/Creatinine Ratio: 20 (ref 9–23)
BUN: 17 mg/dL (ref 6–24)
CALCIUM: 9.2 mg/dL (ref 8.7–10.2)
CHLORIDE: 99 mmol/L (ref 96–106)
CO2: 22 mmol/L (ref 20–29)
Creatinine, Ser: 0.84 mg/dL (ref 0.57–1.00)
GFR calc non Af Amer: 81 mL/min/{1.73_m2} (ref >59)
GFR, EST AFRICAN AMERICAN: 94 mL/min/{1.73_m2} (ref >59)
GLUCOSE: 217 mg/dL — AB (ref 65–99)
POTASSIUM: 3.8 mmol/L (ref 3.5–5.2)
Sodium: 138 mmol/L (ref 134–144)

## 2018-03-14 MED ORDER — RIVAROXABAN 20 MG PO TABS: 20.0000 mg | ORAL_TABLET | Freq: Every day | ORAL | 0 refills | Status: DC

## 2018-03-14 MED ORDER — RIVAROXABAN 20 MG PO TABS: 20.0000 mg | ORAL_TABLET | Freq: Every day | ORAL | 6 refills | Status: DC

## 2018-03-14 MED FILL — LOSARTAN POTASSIUM 100 MG T: 100 | 30 days supply | Qty: 30 | Fill #5

## 2018-03-14 MED FILL — risperiDONE 2 MG TABS: 2 | 30 days supply | Qty: 60 | Fill #2

## 2018-03-14 MED FILL — ?CARVEDILOL 25 MG TABLET: 25 | 30 days supply | Qty: 60 | Fill #5

## 2018-03-14 MED FILL — ?METFORMIN HCL 1,000 MG TAB: 1000 | 30 days supply | Qty: 60 | Fill #6

## 2018-03-14 MED FILL — XARELTO 20 MG TABLET: 20 | 30 days supply | Qty: 30 | Fill #0

## 2018-03-14 MED FILL — glipiZIDE XL 10 MG TB24: 10 | 30 days supply | Qty: 30 | Fill #5

## 2018-03-14 MED FILL — ?FUROSEMIDE 80MG TABLET: 80 | 30 days supply | Qty: 30 | Fill #5

## 2018-03-14 NOTE — Progress Notes (Signed)
PATIENT ARRIVED TODAY FOR EKG  AND LABS FOR UPCOMING  CARDIOVERSION ON 03/18/18. PATIENT AND FAMILY MEMBER AWARE OF APPOINTMENT TIME. SAMPLES OF XARELTO GIVEN, AND E-SENT PRESCRIPTION.  EKG REVIEWED AND SIGNED BY DR Cushing ( D.O.D)

## 2018-03-17 MED FILL — POTASSIUM CL ER 10 MEQ TAB: 10 | 30 days supply | Qty: 90 | Fill #1

## 2018-03-18 ENCOUNTER — Encounter (HOSPITAL_COMMUNITY): Payer: Self-pay | Admitting: Certified Registered Nurse Anesthetist

## 2018-03-18 ENCOUNTER — Encounter (HOSPITAL_COMMUNITY)
Admission: RE | Disposition: A | Discharge status: Home / Self Care | Payer: Self-pay | Source: Ambulatory Visit | Attending: Cardiology

## 2018-03-18 ENCOUNTER — Ambulatory Visit (HOSPITAL_COMMUNITY)
Admission: RE | Admit: 2018-03-18 | Discharge: 2018-03-18 | Disposition: A | Discharge status: Home / Self Care | Payer: Medicaid Other | Source: Ambulatory Visit | Attending: Cardiology | Admitting: Cardiology

## 2018-03-18 DIAGNOSIS — I481 Persistent atrial fibrillation: Secondary | ICD-10-CM | POA: Diagnosis present

## 2018-03-18 SURGERY — CANCELLED PROCEDURE

## 2018-03-18 MED ORDER — SODIUM CHLORIDE 0.9 % IV SOLN: INTRAVENOUS | Status: DC

## 2018-03-18 NOTE — Interval H&P Note (Signed)
History and Physical Interval Note:  03/18/2018 10:30 AM  EBVPL Dzik  has presented today for surgery, with the diagnosis of afib  The various methods of treatment have been discussed with the patient and family. After consideration of risks, benefits and other options for treatment, the patient has consented to  Procedure(s): CARDIOVERSION (N/A) as a surgical intervention .  The patient's history has been reviewed, patient examined, no change in status, stable for surgery.  I have reviewed the patient's chart and labs.  Questions were answered to the patient's satisfaction.     [name]

## 2018-03-18 NOTE — Procedures (Addendum)
Pt presented for DCCV; however she now states she missed dose of xarelto 6/17. Will reschedule for 3 weeks from now once she has been compliant.   [name] 03/18/2018, 7:47 AM

## 2018-03-18 NOTE — Progress Notes (Signed)
During pre-procedure for cardioversion, patient stated she missed a dose of her xarelto.  Procedure cancelled for today per Dr. [name].  Patient to follow back up with primary cardiologist.  Vista Lawman, RN

## 2018-03-18 NOTE — Anesthesia Preprocedure Evaluation (Deleted)
Anesthesia Evaluation  Patient identified by MRN, date of birth, ID band Patient awake    Reviewed: Allergy & Precautions, H&P , NPO status , Patient's Chart, lab work & pertinent test results, reviewed documented beta blocker date and time   Airway Mallampati: II  TM Distance: >3 FB Neck ROM: full    Dental no notable dental hx. (+) Dental Advisory Given   Pulmonary sleep apnea ,    Pulmonary exam normal breath sounds clear to auscultation       Cardiovascular Exercise Tolerance: Good hypertension, Pt. on medications + Past MI and +CHF   Rhythm:regular Rate:Normal     Neuro/Psych PSYCHIATRIC DISORDERS Depression Bipolar Disorder    GI/Hepatic   Endo/Other  diabetes, Insulin DependentMorbid obesity  Renal/GU   negative genitourinary   Musculoskeletal   Abdominal   Peds  Hematology   Anesthesia Other Findings   Reproductive/Obstetrics                            Anesthesia Physical Anesthesia Plan  ASA: III  Anesthesia Plan: General   Post-op Pain Management:    Induction:   PONV Risk Score and Plan: 3 and Treatment may vary due to age or medical condition  Airway Management Planned: Nasal Cannula, Natural Airway and Mask  Additional Equipment:   Intra-op Plan:   Post-operative Plan:   Informed Consent: I have reviewed the patients History and Physical, chart, labs and discussed the procedure including the risks, benefits and alternatives for the proposed anesthesia with the patient or authorized representative who has indicated his/her understanding and acceptance.   Dental Advisory Given  Plan Discussed with: CRNA, Anesthesiologist and Surgeon  Anesthesia Plan Comments:         Anesthesia Quick Evaluation

## 2018-03-18 NOTE — H&P (Signed)
Office Visit   02/23/2018 [facility]    Barrett, Vanessa Croon, PA-C    Cardiology   Persistent atrial fibrillation ([place]) +4 more    Dx   Follow-up ; Referred by Tresa Garter, MD    Reason for Visit     Additional Documentation   Vitals:   BP 116/73    Pulse 98    Ht 5\' 7"  (1.702 m)    Wt 351 lb (159.2 kg)     SpO2 97%    BMI 54.97 kg/m    BSA 2.74 m        More Vitals    Flowsheets:   Anthropometrics,    MEWS Score      Encounter Info:   Billing Info,    History,    Allergies,    Detailed Report       All Notes    Progress Notes by Vanessa Georgia, PA-C at 02/23/2018 11:30 AM   Author: Lonn Georgia, PA-C Author Type: Physician Assistant Certified Filed: 05/15/2992 3:59 PM  Note Status: Signed Cosign: Cosign Not Required Encounter Date: 02/23/2018  Editor: [name] (Physician Assistant Certified)  Expand All Collapse All       Cardiology Office Note   Date:  02/23/2018   ID:  [name], [name] [birth year], MRN [id number]  PCP:  Tresa Garter, MD      Cardiologist: Dr. [name], 01/04/2018 [name], PA-C 02/08/2018     Chief Complaint  Patient presents with  . Follow-up    History of Present Illness: [name] is a 52 y.o. female with a history of D-CHF, persistent Afib, HTN,obesity,MV 2017 w/ nl perfusionand EF 73%, SVT, chronic intermittent chest pain, CHA2DS2-VASc=3(female, HTN, CHF) on Eliquis  5/14 office visit, patient not on a low-sodium diabetic diet, compliance encouraged, patient in atrial fibrillation, compliance with Xarelto for 3 weeks encouraged prior to cardioversion, on carvedilol 25 mg twice daily, referred to health care guide, chest pain possibly secondary to reflux, troponin was negative  [name] presents for cardiology follow up.  Her mother is with her today.  She still feels bad. She is SOB with any activity.  According to her mother,  she eats and sits.  She does not do much walking around the store or anything else because of the shortness of breath.  She is not waking up with lower extremity edema.  She describes orthopnea, she is at high risk to have obesity hypoventilation syndrome so PND is not clear.  When walking approximately 200 feet from the lobby back to the exam room, she had to stop twice to catch her breath.  She feels that she is limiting the salt in the foods that she eats and cooks mostly of her food.  She eats a lot of native and ethnic foods, so is not eating a great deal of prepared foods.  She drinks a great deal of water daily, she thinks it is over 3 L.  She has not missed any doses of Xarelto.  She wants to get out of atrial fibrillation, she is willing to do the cardioversion.  She has chest pain most of the time. It hurts worse with deep inspiration.  She has chest wall tenderness.       Past Medical History:  Diagnosis Date  . Bipolar disease, chronic ([place])   . Chest pain    a. 2012 Myoview: EF 63%, no isch/infarct;  b. 04/2016 Lexiscan MV: EF 73%, no  ischemia/infarct-->Low risk.  . CHF (congestive heart failure) ([place])   . Chronic diastolic CHF (congestive heart failure) ([place])    a. 2015 Echo: EF 55-60%, Gr2 DD;  b. 09/2015 Echo: EF 60-65%, no rwma, mod dil LA, PASP 71mmHg.  Marland Kitchen History of thyrotoxicosis   . Hypertensive heart disease 10/18/2013  . Insulin dependent type 2 diabetes mellitus, uncontrolled ([place])   . Mediastinal adenopathy   . MI (myocardial infarction) ([place])   . Morbid obesity with BMI of 50.0-59.9, adult ([place])   . OSA (obstructive sleep apnea) 03/06/2011    History reviewed. No pertinent surgical history.        Current Outpatient Medications  Medication Sig Dispense Refill  . amLODipine (NORVASC) 10 MG tablet Take 1 tablet (10 mg total) by mouth daily. 90 tablet 3  . carvedilol (COREG) 25 MG tablet Take 1 tablet (25 mg total) by mouth 2 (two) times daily. 180  tablet 3  . Cholecalciferol (VITAMIN D-3) 1000 units CAPS Take 1,000 Units by mouth daily.    . ferrous sulfate 325 (65 FE) MG tablet Take 1 tablet (325 mg total) 3 (three) times daily with meals by mouth. (Patient taking differently: Take 325 mg by mouth daily with breakfast. ) 90 tablet 2  . furosemide (LASIX) 80 MG tablet Take 1 tablet (80 mg total) by mouth daily. 30 tablet 5  . glipiZIDE (GLUCOTROL XL) 10 MG 24 hr tablet Take 1 tablet (10 mg total) by mouth daily with breakfast. 90 tablet 3  . glucose blood test strip Use as instructed 100 each 12  . losartan (COZAAR) 100 MG tablet Take 1 tablet (100 mg total) by mouth daily. 90 tablet 3  . metFORMIN (GLUCOPHAGE) 1000 MG tablet Take 1 tablet (1,000 mg total) by mouth 2 (two) times daily with a meal. 180 tablet 3  . Multiple Vitamins-Calcium (ONE-A-DAY WOMENS PO) Take 1 tablet by mouth daily.    Marland Kitchen oxybutynin (DITROPAN XL) 15 MG 24 hr tablet TAKE 1 TABLET BY MOUTH AT BEDTIME. (Patient taking differently: Take 15 mg by mouth at bedtime) 30 tablet 3  . potassium chloride (KLOR-CON M15) 15 MEQ tablet Take 2 tablets (30 mEq total) daily by mouth. 60 tablet 1  . risperiDONE (RISPERDAL) 2 MG tablet TAKE 2 TABLETS BY MOUTH DAILY. (Patient taking differently: Take 2 mg by mouth at bedtime) 60 tablet 3  . rivaroxaban (XARELTO) 20 MG TABS tablet Take 1 tablet (20 mg total) by mouth daily with supper. 30 tablet 0  . VENTOLIN HFA 108 (90 Base) MCG/ACT inhaler INHALE 1 TO 2 PUFFS EVERY 6 HOURS AS NEEDED FOR WHEEZING/ SHORTNESS OF BREATH 18 g 1   No current facility-administered medications for this visit.     Allergies:   Acetaminophen; Caffeine; Farxiga [dapagliflozin]; and Lisinopril    Social History:  The patient  reports that she has never smoked. She has never used smokeless tobacco. She reports that she does not drink alcohol or use drugs.   Family Status: The patient indicated that her mother is alive. She indicated that her father is  deceased. She reported the following about her brother: HTN. She indicated that the status of her maternal grandfather is unknown. She indicated that the status of her paternal grandfather is unknown.  Family History:  The patient's family history includes Heart disease in her maternal grandfather and paternal grandfather; Heart failure in her father; Hypertension in her mother; Stroke in her father.    ROS:  Please see the history of  present illness. All other systems are reviewed and negative.    PHYSICAL EXAM: VS:  BP 116/73   Pulse 98   Ht 5\' 7"  (1.702 m)   Wt (!) 351 lb (159.2 kg)   SpO2 97%   BMI 54.97 kg/m  , BMI Body mass index is 54.97 kg/m. GEN: Well nourished, well developed, female in no acute distress  HEENT: normal for age  Neck: no JVD seen, difficult to assess secondary to body habitus, no carotid bruit, no masses Cardiac: Irregular rate and rhythm; no murmur, no rubs, or gallops Respiratory: Slightly decreased breath sounds bases bilaterally, normal work of breathing GI: soft, nontender, nondistended, + BS MS: no deformity or atrophy; no edema; distal pulses are 2+ in all 4 extremities   Skin: warm and dry, no rash Neuro:  Strength and sensation are intact Psych: euthymic mood, full affect   EKG:  EKG is not ordered today.  Recent Labs: 08/03/2017: ALT 11 08/24/2017: Magnesium 1.6 12/09/2017: TSH 2.910 12/23/2017: B Natriuretic Peptide 285.1 02/08/2018: BUN 8; Creatinine, Ser 0.65; Hemoglobin 12.8; Platelets 268; Potassium 3.9; Sodium 134    Lipid Panel Labs(Brief)          Component Value Date/Time   CHOL 173 08/06/2016 1245   TRIG 212 (H) 08/06/2016 1245   HDL 45 (L) 08/06/2016 1245   CHOLHDL 3.8 08/06/2016 1245   VLDL 42 (H) 08/06/2016 1245   LDLCALC 86 08/06/2016 1245          Wt Readings from Last 3 Encounters:  02/23/18 (!) 351 lb (159.2 kg)  02/08/18 (!) 335 lb 9.6 oz (152.2 kg)  01/04/18 (!) 331 lb (150.1 kg)      Other studies Reviewed: Additional studies/ records that were reviewed today include: Office notes, hospital records and testing.  ASSESSMENT AND PLAN:  1.  Persistent atrial fibrillation: She has not missed a dose of Xarelto for 3 weeks -She is interested in the cardioversion, but wants Dr. [name] to perform it. -We will schedule it at his first available opportunity, with anesthesia -Continue Xarelto, the importance of compliance was reinforced, samples given but it was emphasized to her that we cannot supply all of her Xarelto -Continue carvedilol twice daily -Check labs prior to the cardioversion and get an ECG at that time  2.  Acute on chronic diastolic CHF: She does not have lower extremity edema or crackles on exam, but neck veins are difficult to assess and she could be holding some fluid in her abdomen. -Her weight is up 15 pounds in 2 weeks -Increase Lasix to 80 mg twice daily for 5 days  -Compliance with 2 L or less of all liquids daily and a low-sodium diet is emphasized -Daily weights are encouraged - Arrangements were made with the health coach, she will start following with her. -She is encouraged to photograph her plates of food before she eats, that may help [place] determine if better portion control as needed.  3.  Hypertension: Her blood pressure is well controlled on current therapy.  4.  Chronic chest pain: During her previous visit, her symptom description was more consistent with GI symptoms.  However, her current chest pain is associated with chest wall tenderness.  No further evaluation is indicated at this time.  Current medicines are reviewed at length with the patient today.  The patient has concerns regarding medicines.  Concerns were addressed  The following changes have been made:  no change  Labs/ tests ordered today include:  Orders Placed This Encounter  Procedures  . Basic metabolic panel  . CBC     Disposition:   FU with Dr.  [name]  Signed, [name], PA-C  02/23/2018 3:54 PM    [place] Phone: [phone]; Fax: [phone]  This note was written with the assistance of speech recognition        For DCCV; compliant with xarelto; no changes [name]

## 2018-03-21 ENCOUNTER — Ambulatory Visit: Payer: Medicaid Other | Attending: Nurse Practitioner | Admitting: Nurse Practitioner

## 2018-03-21 ENCOUNTER — Encounter: Payer: Self-pay | Admitting: Nurse Practitioner

## 2018-03-21 VITALS — BP 134/92 | HR 102 | Temp 99.3°F | Resp 18 | Ht 67.0 in | Wt 340.8 lb

## 2018-03-21 DIAGNOSIS — F3162 Bipolar disorder, current episode mixed, moderate: Secondary | ICD-10-CM | POA: Diagnosis not present

## 2018-03-21 DIAGNOSIS — G4733 Obstructive sleep apnea (adult) (pediatric): Secondary | ICD-10-CM | POA: Diagnosis not present

## 2018-03-21 DIAGNOSIS — I10 Essential (primary) hypertension: Secondary | ICD-10-CM | POA: Diagnosis present

## 2018-03-21 DIAGNOSIS — Z794 Long term (current) use of insulin: Secondary | ICD-10-CM | POA: Insufficient documentation

## 2018-03-21 DIAGNOSIS — I5032 Chronic diastolic (congestive) heart failure: Secondary | ICD-10-CM | POA: Insufficient documentation

## 2018-03-21 DIAGNOSIS — Z6841 Body Mass Index (BMI) 40.0 and over, adult: Secondary | ICD-10-CM | POA: Insufficient documentation

## 2018-03-21 DIAGNOSIS — Z1331 Encounter for screening for depression: Secondary | ICD-10-CM | POA: Insufficient documentation

## 2018-03-21 DIAGNOSIS — I252 Old myocardial infarction: Secondary | ICD-10-CM | POA: Insufficient documentation

## 2018-03-21 DIAGNOSIS — E11649 Type 2 diabetes mellitus with hypoglycemia without coma: Secondary | ICD-10-CM | POA: Diagnosis not present

## 2018-03-21 DIAGNOSIS — N393 Stress incontinence (female) (male): Secondary | ICD-10-CM | POA: Diagnosis not present

## 2018-03-21 DIAGNOSIS — I481 Persistent atrial fibrillation: Secondary | ICD-10-CM | POA: Diagnosis not present

## 2018-03-21 DIAGNOSIS — E1165 Type 2 diabetes mellitus with hyperglycemia: Secondary | ICD-10-CM

## 2018-03-21 DIAGNOSIS — I4819 Other persistent atrial fibrillation: Secondary | ICD-10-CM

## 2018-03-21 DIAGNOSIS — Z9119 Patient's noncompliance with other medical treatment and regimen: Secondary | ICD-10-CM | POA: Insufficient documentation

## 2018-03-21 DIAGNOSIS — Z7901 Long term (current) use of anticoagulants: Secondary | ICD-10-CM | POA: Insufficient documentation

## 2018-03-21 DIAGNOSIS — I110 Hypertensive heart disease with heart failure: Secondary | ICD-10-CM | POA: Diagnosis not present

## 2018-03-21 DIAGNOSIS — Z888 Allergy status to other drugs, medicaments and biological substances status: Secondary | ICD-10-CM | POA: Diagnosis not present

## 2018-03-21 DIAGNOSIS — Z79899 Other long term (current) drug therapy: Secondary | ICD-10-CM | POA: Insufficient documentation

## 2018-03-21 DIAGNOSIS — J849 Interstitial pulmonary disease, unspecified: Secondary | ICD-10-CM

## 2018-03-21 LAB — POCT GLYCOSYLATED HEMOGLOBIN (HGB A1C): Hemoglobin A1C: 9.3 % — AB (ref 4.0–5.6)

## 2018-03-21 LAB — GLUCOSE, POCT (MANUAL RESULT ENTRY): POC GLUCOSE: 277 mg/dL — AB (ref 70–99)

## 2018-03-21 MED ORDER — RIVAROXABAN 20 MG PO TABS: 20.0000 mg | ORAL_TABLET | Freq: Every day | ORAL | 6 refills | Status: DC

## 2018-03-21 MED ORDER — OXYBUTYNIN CHLORIDE ER 15 MG PO TB24: 15.0000 mg | ORAL_TABLET | Freq: Every day | ORAL | 3 refills | Status: DC

## 2018-03-21 NOTE — Progress Notes (Signed)
hemoglo

## 2018-03-21 NOTE — Patient Instructions (Signed)
Living With Bipolar Disorder If you have been diagnosed with bipolar disorder, you may be relieved that you now know why you have felt or behaved a certain way. You may also feel overwhelmed about the treatment ahead, how to get the support you need, and how to deal with the condition day-to-day. With care and support, you can learn to manage your symptoms and live with bipolar disorder. How to manage lifestyle changes Managing stress Stress is your body's reaction to life changes and events, both good and bad. Stress can play a major role in bipolar disorder, so it is important to learn how to cope with stress. Some techniques to cope with stress include:  Meditation, muscle relaxation, and breathing exercises.  Exercise. Even a short daily walk can help to lower stress levels.  Getting enough good-quality sleep. Too little sleep can cause mania to start (can trigger mania).  Making a schedule to manage your time. Knowing your daily schedule can help to keep you from feeling overwhelmed by tasks and deadlines.  Spending time on hobbies that you enjoy.  Medicines Your health care provider may suggest certain medicines if he or she feels that they will help improve your condition. Avoid using caffeine, alcohol, and other substances that may prevent your medicines from working properly (may interact). It is also important to:  Talk with your pharmacist or health care provider about all the medicines that you take, their possible side effects, and which medicines are safe to take together.  Make it your goal to take part in all treatment decisions (shared decision-making). Ask about possible side effects of medicines that your health care provider recommends, and tell him or her how you feel about having those side effects. It is best if shared decision-making with your health care provider is part of your total treatment plan.  If you are taking medicines as part of your treatment, do not stop  taking medicines before you ask your health care provider if it is safe to stop. You may need to have the medicine slowly decreased (tapered) over time to decrease the risk of harmful side effects. Relationships Spend time with people that you trust and with whom you feel a sense of understanding and calm. Try to find friends or family members who make you feel safe and can help you control feelings of mania. Consider going to couples counseling, family education classes, or family therapy to:  Educate your loved ones about your condition and offer suggestions about how they can support you.  Help resolve conflicts.  Help develop communication skills in your relationships.  How to recognize changes in your condition Everyone responds differently to treatment for bipolar disorder. Some signs that your condition is improving include:  Leveling of your mood. You may have less anger and excitement about daily activities, and your low moods may not be as bad.  Your symptoms being less intense.  Feeling calm more often.  Thinking clearly.  Not experiencing consequences for extreme behavior.  Feeling like your life is settling down.  Your behavior seeming more normal to you and to other people.  Some signs that your condition may be getting worse include:  Sleep problems.  Moods cycling between deep lows and unusually high (excess) energy.  Extreme emotions.  More anger at loved ones.  Staying away from others (isolating yourself).  A feeling of power or superiority.  Completing a lot of tasks in a very short amount of time.  Unusual thoughts and behaviors.  Suicidal thoughts.  Where to find support Talking to others  Try making a list of the people you may want to tell about your condition, such as the people you trust most.  Plan what you are willing to talk about and what you do not want to discuss. Think about your needs ahead of time, and how your friends and family  members can support you.  Let your loved ones know when they can share advice and when you would just like them to listen.  Give your loved ones information about bipolar disorder, and encourage them to learn about the condition. Finances Not all insurance plans cover mental health care, so it is important to check with your insurance carrier. If paying for co-pays or counseling services is a problem, search for a local or county mental health care center. Public mental health care services may be offered there at a low cost or no cost when you are not able to see a private health care provider. If you are taking medicine for depression, you may be able to get the generic form, which may be less expensive than brand-name medicine. Some makers of prescription medicines also offer help to patients who cannot afford the medicines they need. Follow these instructions at home: Medicines  Take over-the-counter and prescription medicines only as told by your health care provider or pharmacist.  Ask your pharmacist what over-the-counter cold medicines you should avoid. Some medicines can make symptoms worse. General instructions  Ask for support from trusted family members or friends to make sure you stay on track with your treatment.  Keep a journal to write down your daily moods, medicines, sleep habits, and life events. This may help you have more success with your treatment.  Make and follow a routine for daily meal times. Eat healthy foods, such as whole grains, vegetables, and fresh fruit.  Try to go to sleep and wake up around the same time every day.  Keep all follow-up visits as told by your health care provider. This is important. Questions to ask your health care provider:  If you are taking medicines: ? How long do I need to take medicine? ? Are there any long-term side effects of my medicine? ? Are there any alternatives to taking medicine?  How would I benefit from  therapy?  How often should I follow up with a health care provider? Contact a health care provider if:  Your symptoms get worse or they do not get better with treatment. Get help right away if:  You have thoughts about harming yourself or others. If you ever feel like you may hurt yourself or others, or have thoughts about taking your own life, get help right away. You can go to your nearest emergency department or call:  Your local emergency services (911 in the [place]  A suicide crisis helpline, such as the [place] at 407-299-3850. This is open 24-hours a day.  Summary  Learning ways to deal with stress can help to calm you and may also help your treatment work better.  There is a wide range of medicines that can help to treat bipolar disorder.  Having healthy relationships can help to make your moods more stable.  Contact a health care provider if your symptoms get worse or they do not get better with treatment. This information is not intended to replace advice given to you by your health care provider. Make sure you discuss any questions you have with your  health care provider. Document Released: 01/14/2017 Document Revised: 01/14/2017 Document Reviewed: 01/14/2017 Elsevier Interactive Patient Education  2018 [place] and Stress Management Stress is a normal reaction to life events. It is what you feel when life demands more than you are used to or more than you can handle. Some stress can be useful. For example, the stress reaction can help you catch the last bus of the day, study for a test, or meet a deadline at work. But stress that occurs too often or for too long can cause problems. It can affect your emotional health and interfere with relationships and normal daily activities. Too much stress can weaken your immune system and increase your risk for physical illness. If you already have a medical problem, stress can make it  worse. What are the causes? All sorts of life events may cause stress. An event that causes stress for one person may not be stressful for another person. Major life events commonly cause stress. These may be positive or negative. Examples include losing your job, moving into a new home, getting married, having a baby, or losing a loved one. Less obvious life events may also cause stress, especially if they occur day after day or in combination. Examples include working long hours, driving in traffic, caring for children, being in debt, or being in a difficult relationship. What are the signs or symptoms? Stress may cause emotional symptoms including, the following:  Anxiety. This is feeling worried, afraid, on edge, overwhelmed, or out of control.  Anger. This is feeling irritated or impatient.  Depression. This is feeling sad, down, helpless, or guilty.  Difficulty focusing, remembering, or making decisions.  Stress may cause physical symptoms, including the following:  Aches and pains. These may affect your head, neck, back, stomach, or other areas of your body.  Tight muscles or clenched jaw.  Low energy or trouble sleeping.  Stress may cause unhealthy behaviors, including the following:  Eating to feel better (overeating) or skipping meals.  Sleeping too little, too much, or both.  Working too much or putting off tasks (procrastination).  Smoking, drinking alcohol, or using drugs to feel better.  How is this diagnosed? Stress is diagnosed through an assessment by your health care provider. Your health care provider will ask questions about your symptoms and any stressful life events.Your health care provider will also ask about your medical history and may order blood tests or other tests. Certain medical conditions and medicine can cause physical symptoms similar to stress. Mental illness can cause emotional symptoms and unhealthy behaviors similar to stress. Your health care  provider may refer you to a mental health professional for further evaluation. How is this treated? Stress management is the recommended treatment for stress.The goals of stress management are reducing stressful life events and coping with stress in healthy ways. Techniques for reducing stressful life events include the following:  Stress identification. Self-monitor for stress and identify what causes stress for you. These skills may help you to avoid some stressful events.  Time management. Set your priorities, keep a calendar of events, and learn to say "no." These tools can help you avoid making too many commitments.  Techniques for coping with stress include the following:  Rethinking the problem. Try to think realistically about stressful events rather than ignoring them or overreacting. Try to find the positives in a stressful situation rather than focusing on the negatives.  Exercise. Physical exercise can release both physical and emotional tension. The  key is to find a form of exercise you enjoy and do it regularly.  Relaxation techniques. These relax the body and mind. Examples include yoga, meditation, tai chi, biofeedback, deep breathing, progressive muscle relaxation, listening to music, being out in nature, journaling, and other hobbies. Again, the key is to find one or more that you enjoy and can do regularly.  Healthy lifestyle. Eat a balanced diet, get plenty of sleep, and do not smoke. Avoid using alcohol or drugs to relax.  Strong support network. Spend time with family, friends, or other people you enjoy being around.Express your feelings and talk things over with someone you trust.  Counseling or talktherapy with a mental health professional may be helpful if you are having difficulty managing stress on your own. Medicine is typically not recommended for the treatment of stress.Talk to your health care provider if you think you need medicine for symptoms of  stress. Follow these instructions at home:  Keep all follow-up visits as directed by your health care provider.  Take all medicines as directed by your health care provider. Contact a health care provider if:  Your symptoms get worse or you start having new symptoms.  You feel overwhelmed by your problems and can no longer manage them on your own. Get help right away if:  You feel like hurting yourself or someone else. This information is not intended to replace advice given to you by your health care provider. Make sure you discuss any questions you have with your health care provider. Document Released: 03/10/2001 Document Revised: 02/20/2016 Document Reviewed: 05/09/2013 Elsevier Interactive Patient Education  2017 [employer].

## 2018-03-21 NOTE — Progress Notes (Signed)
Assessment & Plan:  Analy was seen today for establish care and referral.  Diagnoses and all orders for this visit:  Uncontrolled type 2 diabetes mellitus with hyperglycemia (HCC) -     Glucose (CBG) -     HgB A1c Continue blood sugar control as discussed in office today, low carbohydrate diet, and regular physical exercise as tolerated, 150 minutes per week (30 min each day, 5 days per week, or 50 min 3 days per week). Keep blood sugar logs with fasting goal of 80-130 mg/dl, post prandial less than 180.  For Hypoglycemia: BS <60 and Hyperglycemia BS >400; contact the clinic ASAP. Annual eye exams and foot exams are recommended.  Essential Hypertension Continue all antihypertensives as prescribed.  Remember to bring in your blood pressure log with you for your follow up appointment.  DASH/Mediterranean Diets are healthier choices for HTN.   Stress incontinence in female -     oxybutynin (DITROPAN XL) 15 MG 24 hr tablet; Take 1 tablet (15 mg total) by mouth at bedtime.  Bipolar 1 disorder, mixed, moderate (HCC) Taking risperdal 4mg  instead of 2mg . She was given Behavioral health resources and declines to see a therapist. Her mother reports she sits at home all day eating and doing nothing.  Depression screen [facility] 2/9 03/21/2018  Decreased Interest 0  Down, Depressed, Hopeless 0  PHQ - 2 Score 0  Altered sleeping 0  Tired, decreased energy 0  Change in appetite 0  Feeling bad or failure about yourself  0  Trouble concentrating 0  Moving slowly or fidgety/restless 0  Suicidal thoughts 0  PHQ-9 Score 0    ILD (interstitial lung disease) (HCC) -     Ambulatory referral to Pulmonology  Persistent atrial fibrillation (HCC) -     rivaroxaban (XARELTO) 20 MG TABS tablet; Take 1 tablet (20 mg total) by mouth daily with supper.    Patient has been counseled on age-appropriate routine health concerns for screening and prevention. These are reviewed and up-to-date. Referrals have  been placed accordingly. Immunizations are up-to-date or declined.    Subjective:   Chief Complaint  Patient presents with  . Establish Care    Pt. is here to establish care for diabetes, hypertension.   . Referral    Pt. would like a referral to dermatology for inflammation on her scalp with bumps.    HPI [name] [age] y.o. female presents to office today to establish care. She is accompanied by her mother. She has a history of afib(currently seeing Cardiology with plans for cardioversion July 9th). Taking Xarelto as prescribed), ILD (needs to follow up with Pulmonology), acute on chronic CHF (taking lasix 80mg  daily), HTN, chronic chest pain, bipolar disorder (she is not following up with psychiatry), stress incontinence (taking oxybutinin which provides moderate relief of symptoms),  OSA (noncompliant with CPAP) and poorly controlled DM.   DM  Chronic. Not well controlled. Checking blood sugars twice a week.  Average 230s. She is not diet or exercise compliant. Last a1c 8.6 and currently up to 9.3. She endorses medication compliance taking metformin 1000 mg BID and glipizde 10mg  daily however her glipizide has not been filled in over a month.  Lab Results  Component Value Date   HGBA1C 9.3 (A) 03/21/2018   Essential Hypertension Chronic. Blood pressure slightly elevated today. She is currently taking amlodipine 10mg  dialy, carvedilol 25mg  BID and losartan 100 mg daily. Denies lightheadedness, dizziness or headaches or BLE edema. She endorses chest wall tenderness/heaviness and shortness  of breath with minimal exertion.  BP Readings from Last 3 Encounters:  03/21/18 (!) 134/92  02/23/18 116/73  02/08/18 (!) 150/102    Review of Systems  Constitutional: Negative for fever, malaise/fatigue and weight loss.  HENT: Negative.  Negative for nosebleeds.   Eyes: Negative.  Negative for blurred vision, double vision and photophobia.  Respiratory: Positive for shortness of breath. Negative  for cough.   Cardiovascular: Positive for chest pain (chest wall tenderness), palpitations and leg swelling.  Gastrointestinal: Negative.  Negative for heartburn, nausea and vomiting.  Genitourinary:       SEE HPI  Musculoskeletal: Negative.  Negative for myalgias.  Neurological: Negative.  Negative for dizziness, focal weakness, seizures and headaches.  Psychiatric/Behavioral: Negative for suicidal ideas. The patient is nervous/anxious.        BIPOLAR D/O    Past Medical History:  Diagnosis Date  . Bipolar disease, chronic ([place])   . Chest pain    a. 2012 Myoview: EF 63%, no isch/infarct;  b. 04/2016 Lexiscan MV: EF 73%, no ischemia/infarct-->Low risk.  . CHF (congestive heart failure) ([place])   . Chronic diastolic CHF (congestive heart failure) ([place])    a. 2015 Echo: EF 55-60%, Gr2 DD;  b. 09/2015 Echo: EF 60-65%, no rwma, mod dil LA, PASP 1mmHg.  Marland Kitchen History of thyrotoxicosis   . Hypertension   . Hypertensive heart disease 10/18/2013  . Insulin dependent type 2 diabetes mellitus, uncontrolled ([place])   . Mediastinal adenopathy   . MI (myocardial infarction) ([place])   . Morbid obesity with BMI of 50.0-59.9, adult ([place])   . OSA (obstructive sleep apnea) 03/06/2011    History reviewed. No pertinent surgical history.  Family History  Problem Relation Age of Onset  . Heart failure Father   . Stroke Father   . Hypertension Mother   . Heart disease Maternal Grandfather     Social History Reviewed with no changes to be made today.   Outpatient Medications Prior to Visit  Medication Sig Dispense Refill  . amLODipine (NORVASC) 10 MG tablet Take 1 tablet (10 mg total) by mouth daily. 90 tablet 3  . carvedilol (COREG) 25 MG tablet Take 1 tablet (25 mg total) by mouth 2 (two) times daily. 180 tablet 3  . ferrous sulfate 325 (65 FE) MG tablet Take 1 tablet (325 mg total) 3 (three) times daily with meals by mouth. (Patient taking differently: Take 325 mg by mouth 2 (two) times daily. ) 90 tablet  2  . furosemide (LASIX) 80 MG tablet Take 1 tablet (80 mg total) by mouth daily. 30 tablet 5  . glipiZIDE (GLUCOTROL XL) 10 MG 24 hr tablet Take 1 tablet (10 mg total) by mouth daily with breakfast. 90 tablet 3  . glucose blood test strip Use as instructed 100 each 12  . losartan (COZAAR) 100 MG tablet Take 1 tablet (100 mg total) by mouth daily. 90 tablet 3  . metFORMIN (GLUCOPHAGE) 1000 MG tablet Take 1 tablet (1,000 mg total) by mouth 2 (two) times daily with a meal. 180 tablet 3  . Multiple Vitamins-Calcium (ONE-A-DAY WOMENS PO) Take 1 tablet by mouth daily.    . risperiDONE (RISPERDAL) 2 MG tablet TAKE 2 TABLETS BY MOUTH DAILY. (Patient taking differently: Take 2 mg by mouth at bedtime) 60 tablet 3  . VENTOLIN HFA 108 (90 Base) MCG/ACT inhaler INHALE 1 TO 2 PUFFS EVERY 6 HOURS AS NEEDED FOR WHEEZING/ SHORTNESS OF BREATH 18 g 1  . oxybutynin (DITROPAN XL) 15 MG 24  hr tablet TAKE 1 TABLET BY MOUTH AT BEDTIME. (Patient taking differently: Take 15 mg by mouth at bedtime) 30 tablet 3  . potassium chloride (K-DUR,KLOR-CON) 10 MEQ tablet Take 10 mEq by mouth daily.    . rivaroxaban (XARELTO) 20 MG TABS tablet Take 1 tablet (20 mg total) by mouth daily with supper. 14 tablet 0  . potassium chloride (KLOR-CON M15) 15 MEQ tablet Take 2 tablets (30 mEq total) daily by mouth. (Patient not taking: Reported on 03/15/2018) 60 tablet 1   No facility-administered medications prior to visit.     Allergies  Allergen Reactions  . Acetaminophen Other (See Comments)    Seizure-like "fits" as a child  . Caffeine     Tense, anxiety, increased urination  . [place] [Dapagliflozin] Other (See Comments)    Hallucinations, drop in blood sugar  . Lisinopril Rash    Rash with lisinopril; but fosinopril is ok per patient       Objective:    BP (!) 134/92 (BP Location: Left Arm, Patient Position: Sitting, Cuff Size: Large)   Pulse (!) 102   Temp 99.3 F (37.4 C) (Oral)   Resp 18   Ht 5\' 7"  (1.702 m)   Wt  (!) 340 lb 12.8 oz (154.6 kg)   SpO2 95%   BMI 53.38 kg/m  Wt Readings from Last 3 Encounters:  03/21/18 (!) 340 lb 12.8 oz (154.6 kg)  02/23/18 (!) 351 lb (159.2 kg)  02/08/18 (!) 335 lb 9.6 oz (152.2 kg)    Physical Exam  Constitutional: She is oriented to person, place, and time. She appears well-developed and well-nourished. She is cooperative.  HENT:  Head: Normocephalic and atraumatic.  Eyes: EOM are normal.  Neck: Normal range of motion.  Cardiovascular: Normal rate, regular rhythm and normal heart sounds. Exam reveals no gallop and no friction rub.  No murmur heard. Pulmonary/Chest: Breath sounds normal. No accessory muscle usage. Tachypnea (with increased respiratory effort) noted. No respiratory distress. She has no decreased breath sounds. She has no wheezes. She has no rhonchi. She has no rales. She exhibits no tenderness.  Abdominal: Bowel sounds are normal. She exhibits distension.  Musculoskeletal: Normal range of motion. She exhibits edema (BLE).       Right lower leg: She exhibits swelling.       Left lower leg: She exhibits swelling.       Right foot: There is swelling.       Left foot: There is swelling.  Neurological: She is alert and oriented to person, place, and time. Coordination normal.  Skin: Skin is warm and dry.  Psychiatric: She has a normal mood and affect. Her behavior is normal. Judgment and thought content normal.  Nursing note and vitals reviewed.     Patient has been counseled extensively about nutrition and exercise as well as the importance of adherence with medications and regular follow-up. The patient was given clear instructions to go to ER or return to medical center if symptoms don't improve, worsen or new problems develop. The patient verbalized understanding.   Follow-up: Return in about 3 months (around 06/21/2018).   [name], FNP-BC [facility] and [place], [place]   03/24/2018,  1:16 AM

## 2018-03-23 ENCOUNTER — Telehealth: Payer: Self-pay | Admitting: *Deleted

## 2018-03-23 NOTE — Telephone Encounter (Signed)
DCCV rescheduled to 04-05-18 @ 2 pm. Left message for pt to call    Please reschedule DCCV for 3 weeks from now  [facility]

## 2018-03-24 ENCOUNTER — Encounter: Payer: Self-pay | Admitting: Nurse Practitioner

## 2018-03-29 ENCOUNTER — Encounter: Payer: Self-pay | Admitting: Physician Assistant

## 2018-03-29 ENCOUNTER — Ambulatory Visit (INDEPENDENT_AMBULATORY_CARE_PROVIDER_SITE_OTHER): Payer: No Typology Code available for payment source | Admitting: Physician Assistant

## 2018-03-29 VITALS — BP 128/84 | HR 105 | Ht 67.0 in | Wt 346.4 lb

## 2018-03-29 DIAGNOSIS — I481 Persistent atrial fibrillation: Secondary | ICD-10-CM

## 2018-03-29 DIAGNOSIS — I4819 Other persistent atrial fibrillation: Secondary | ICD-10-CM

## 2018-03-29 DIAGNOSIS — I5032 Chronic diastolic (congestive) heart failure: Secondary | ICD-10-CM

## 2018-03-29 DIAGNOSIS — R0902 Hypoxemia: Secondary | ICD-10-CM

## 2018-03-29 DIAGNOSIS — Z7901 Long term (current) use of anticoagulants: Secondary | ICD-10-CM

## 2018-03-29 DIAGNOSIS — Z01812 Encounter for preprocedural laboratory examination: Secondary | ICD-10-CM

## 2018-03-29 MED FILL — OXYBUTYNIN CL ER 15 MG TAB: 15 | 30 days supply | Qty: 30 | Fill #0

## 2018-03-29 NOTE — H&P (View-Only) (Signed)
Cardiology Office Note   Date:  03/29/2018   ID:  [name] 05-24-1966, MRN [id number]  PCP:  Vanessa Pounds, NP  Cardiologist: Dr. Stanford [name], 03/18/2018 in hospital Vanessa Ferries, PA-C 02/23/2018  Chief Complaint  Patient presents with  . Shortness of Breath    pt states having a lot of SOB with activity     History of Present Illness: [name] is a 52 y.o. female with a history of D-CHF, persistent Afib, HTN,obesity,MV 2017 w/ nl perfusionand EF 73%, SVT, chronic intermittent chest pain, CHA2DS2-VASc=3(female, HTN, CHF) on Eliquis  5/29, seen in the office and cardioversion planned. 6/21, patient came in for the cardioversion but stated she had missed a dose of Xarelto, cardioversion canceled 6/24 office visit with PCP,, Risperdal dose had been 4 mg instead of 2 mg, was decreased, patient refused to see a therapist  [name] presents for cardiology follow up.  She still gets SOB w/ minimal activity. Because of this, does not feel like doing much. Sits all day.  No LE edema, chronic orthopnea, no PND.  Is working on portion control to eat less.   No palpitations, has not been light-headed or dizzy.  Wants the DCCV.   Mother is making sure no doses of Xarelto are missed.   No chest pain.   Says her oxygen levels have been low at times, mentions 86, not specific as to when this was.    Past Medical History:  Diagnosis Date  . Bipolar disease, chronic ([name])   . Chest pain    a. 2012 Myoview: EF 63%, no isch/infarct;  b. 04/2016 Lexiscan MV: EF 73%, no ischemia/infarct-->Low risk.  . CHF (congestive heart failure) ([place])   . Chronic diastolic CHF (congestive heart failure) ([place])    a. 2015 Echo: EF 55-60%, Gr2 DD;  b. 09/2015 Echo: EF 60-65%, no rwma, mod dil LA, PASP 27mmHg.  Marland Kitchen History of thyrotoxicosis   . Hypertension   . Hypertensive heart disease 10/18/2013  . Insulin dependent type 2 diabetes mellitus, uncontrolled ([place])   . Mediastinal  adenopathy   . MI (myocardial infarction) ([place])   . Morbid obesity with BMI of 50.0-59.9, adult ([place])   . OSA (obstructive sleep apnea) 03/06/2011    Past Surgical History:  Procedure Laterality Date  . None      Current Outpatient Medications  Medication Sig Dispense Refill  . amLODipine (NORVASC) 10 MG tablet Take 1 tablet (10 mg total) by mouth daily. 90 tablet 3  . carvedilol (COREG) 25 MG tablet Take 1 tablet (25 mg total) by mouth 2 (two) times daily. 180 tablet 3  . ferrous sulfate 325 (65 FE) MG tablet Take 1 tablet (325 mg total) 3 (three) times daily with meals by mouth. (Patient taking differently: Take 325 mg by mouth 2 (two) times daily. ) 90 tablet 2  . furosemide (LASIX) 80 MG tablet Take 1 tablet (80 mg total) by mouth daily. 30 tablet 5  . glipiZIDE (GLUCOTROL XL) 10 MG 24 hr tablet Take 1 tablet (10 mg total) by mouth daily with breakfast. 90 tablet 3  . glucose blood test strip Use as instructed 100 each 12  . losartan (COZAAR) 100 MG tablet Take 1 tablet (100 mg total) by mouth daily. 90 tablet 3  . metFORMIN (GLUCOPHAGE) 1000 MG tablet Take 1 tablet (1,000 mg total) by mouth 2 (two) times daily with a meal. 180 tablet 3  . Multiple Vitamins-Calcium (ONE-A-DAY WOMENS PO) Take 1 tablet  by mouth daily.    Marland Kitchen oxybutynin (DITROPAN XL) 15 MG 24 hr tablet Take 1 tablet (15 mg total) by mouth at bedtime. 30 tablet 3  . potassium chloride (K-DUR) 10 MEQ tablet Take 30 mEq by mouth daily.  1  . risperiDONE (RISPERDAL) 2 MG tablet TAKE 2 TABLETS BY MOUTH DAILY. (Patient taking differently: Take 2 mg by mouth at bedtime) 60 tablet 3  . rivaroxaban (XARELTO) 20 MG TABS tablet Take 1 tablet (20 mg total) by mouth daily with supper. 30 tablet 6  . VENTOLIN HFA 108 (90 Base) MCG/ACT inhaler INHALE 1 TO 2 PUFFS EVERY 6 HOURS AS NEEDED FOR WHEEZING/ SHORTNESS OF BREATH 18 g 1   No current facility-administered medications for this visit.     Allergies:   Acetaminophen; Caffeine;  Farxiga [dapagliflozin]; and Lisinopril    Social History:  The patient  reports that she has never smoked. She has never used smokeless tobacco. She reports that she does not drink alcohol or use drugs.   Family History:  The patient's family history includes Heart disease in her maternal grandfather; Heart failure in her father; Hypertension in her mother; Stroke in her father.    ROS:  Please see the history of present illness. All other systems are reviewed and negative.    PHYSICAL EXAM:    VS:  BP 128/84   Pulse (!) 105   Ht 5\' 7"  (1.702 m)   Wt (!) 346 lb 6.4 oz (157.1 kg)   SpO2 95%   BMI 54.25 kg/m  , BMI Body mass index is 54.25 kg/m. GEN: Well nourished, well developed, female in no acute distress  HEENT: normal for age  Neck: no JVD seen, difficult to assess secondary to body habitus, no carotid bruit, no masses Cardiac: Irregular rate and rhythm; no murmur, no rubs, or gallops Respiratory:  clear to auscultation bilaterally, normal work of breathing GI: soft, nontender, nondistended, + BS MS: no deformity or atrophy; no edema; distal pulses are 2+ in all 4 extremities   Skin: warm and dry, no rash Neuro:  Strength and sensation are intact Psych: euthymic mood, full affect  Oxygen saturations      At rest  96%  With exertion  70%  On 2 L of O2  95%        EKG:  EKG is ordered today. The ekg ordered today demonstrates atrial fibrillation with rapid ventricular response, heart rate 105, no significant morphology changes from 03/14/2018  ECHO: 07/2017 - Left ventricle: The cavity size was normal. Wall thickness was   increased in a pattern of moderate LVH. Systolic function was   normal. The estimated ejection fraction was in the range of 55%   to 60%. Wall motion was normal; there were no regional wall   motion abnormalities. Features are consistent with a pseudonormal   left ventricular filling pattern, with concomitant abnormal   relaxation and increased  filling pressure (grade 2 diastolic   dysfunction). - Mitral valve: Moderately to severely calcified annulus. Valve   area by continuity equation (using LVOT flow): 1.8 cm^2. - Left atrium: The atrium was moderately dilated. - Right atrium: The atrium was mildly to moderately dilated. - Tricuspid valve: There was mild-moderate regurgitation. - Pulmonary arteries: Systolic pressure was moderately increased.   PA peak pressure: 58 mm Hg (S).  Recent Labs: 08/03/2017: ALT 11 08/24/2017: Magnesium 1.6 12/09/2017: TSH 2.910 12/23/2017: B Natriuretic Peptide 285.1 03/14/2018: BUN 17; Creatinine, Ser 0.84; Hemoglobin 12.2; Platelets 241;  Potassium 3.8; Sodium 138    Lipid Panel    Component Value Date/Time   CHOL 173 08/06/2016 1245   TRIG 212 (H) 08/06/2016 1245   HDL 45 (L) 08/06/2016 1245   CHOLHDL 3.8 08/06/2016 1245   VLDL 42 (H) 08/06/2016 1245   LDLCALC 86 08/06/2016 1245     Wt Readings from Last 3 Encounters:  03/29/18 (!) 346 lb 6.4 oz (157.1 kg)  03/21/18 (!) 340 lb 12.8 oz (154.6 kg)  02/23/18 (!) 351 lb (159.2 kg)     Other studies Reviewed: Additional studies/ records that were reviewed today include: Office notes, hospital records and testing.  ASSESSMENT AND PLAN:  1.  Persistent atrial fibrillation: -The risks and benefits of the procedure have previously been explained to the patient and her mother, they are agreeable to proceeding -The importance of not missing a dose of Xarelto was emphasized, her mother will make sure she takes it every night -She was given instructions and the procedure has been scheduled -Check labs today  2.  Chronic anticoagulation: -Compliance was emphasized, no doses missed  3.  Hypoxia: - She is not short of breath at rest but has complained of dyspnea on exertion. - She does not have significant volume overload by exam - A walk test today was conducted and without very much exertion, her oxygen level dropped to 70%.  This was  confirmed by having her continue to ambulate, her oxygen saturation remained low - I do not think she has a PE as she has rarely missed doses of the Xarelto for the last 6 weeks, and has missed no doses for the last 3 weeks - Obesity hypoventilation is a concern, but she does not have trouble taking a deep breath and has good air exchange in all lung fields --I suspect her oxygen saturation also drops during sleep, have her use the oxygen overnight. -Start home O2 with exertion and nightly and refer to pulmonary  4.  Chronic diastolic CHF: - Her weight is registered as being up 6 [name], but volume status is good by exam. -Her dyspnea on exertion has not changed -She is paying more attention to the sodium in foods and is working on portion control. -No med changes for now   Current medicines are reviewed at length with the patient today.  The patient does not have concerns regarding medicines.  The following changes have been made: Home O2  Labs/ tests ordered today include:   Orders Placed This Encounter  Procedures  . Basic metabolic panel  . CBC  . Ambulatory referral to Pulmonology  . Ambulatory Referral for DME  . EKG 12-Lead     Disposition:   FU with Dr. Stanford [name]  Signed, Vanessa Ferries, PA-C  03/29/2018 5:04 PM    [place] Phone: [phone]; Fax: [phone]  This note was written with the assistance of speech recognition software. Please excuse any transcriptional errors.

## 2018-03-29 NOTE — Patient Instructions (Addendum)
Continue low sodium diabetic diet  You are scheduled for a Cardioversion on Tuesday, July 9th, 2019 with Dr. [name].  Please arrive at the [facility] (Main Entrance A) at [facility]: [address] [place], [place] [address] at 1:00 pm.  DIET: Nothing to eat or drink after midnight except a sip of water with medications (see medication instructions below).  Medication Instructions: Hold METFORMIN and GLIPIZIDE morning of procedure  Continue your anticoagulant: Xarelto You will need to continue your anticoagulant after your procedure until you are told by your provider that it is safe to stop  Labs: TODAY  You must have a responsible person to drive you home and stay in the waiting area during your procedure. Failure to do so could result in cancellation.  Bring your insurance cards.  *Special Note: Every effort is made to have your procedure done on time. Occasionally there are emergencies that occur at the hospital that may cause delays. Please be patient if a delay does occur.    You have been referred to pulmonology for shortness of breath on exertion.  Suanne Marker has ordered for you to have home oxygen as well.

## 2018-03-29 NOTE — Progress Notes (Addendum)
Cardiology Office Note   Date:  03/29/2018   ID:  [name], [name] 1966-04-17, MRN [id number]  PCP:  [name], NP  Cardiologist: Dr. [name], 03/18/2018 in hospital [name], PA-C 02/23/2018  Chief Complaint  Patient presents with  . Shortness of Breath    pt states having a lot of SOB with activity     History of Present Illness: [name] is a 52 y.o. female with a history of D-CHF, persistent Afib, HTN,obesity,MV 2017 w/ nl perfusionand EF 73%, SVT, chronic intermittent chest pain, CHA2DS2-VASc=3(female, HTN, CHF) on Eliquis  5/29, seen in the office and cardioversion planned. 6/21, patient came in for the cardioversion but stated she had missed a dose of Xarelto, cardioversion canceled 6/24 office visit with PCP,, Risperdal dose had been 4 mg instead of 2 mg, was decreased, patient refused to see a therapist  [name] presents for cardiology follow up.  She still gets SOB w/ minimal activity. Because of this, does not feel like doing much. Sits all day.  No LE edema, chronic orthopnea, no PND.  Is working on portion control to eat less.   No palpitations, has not been light-headed or dizzy.  Wants the DCCV.   Mother is making sure no doses of Xarelto are missed.   No chest pain.   Says her oxygen levels have been low at times, mentions 86, not specific as to when this was.    Past Medical History:  Diagnosis Date  . Bipolar disease, chronic ([place])   . Chest pain    a. 2012 Myoview: EF 63%, no isch/infarct;  b. 04/2016 Lexiscan MV: EF 73%, no ischemia/infarct-->Low risk.  . CHF (congestive heart failure) ([place])   . Chronic diastolic CHF (congestive heart failure) ([place])    a. 2015 Echo: EF 55-60%, Gr2 DD;  b. 09/2015 Echo: EF 60-65%, no rwma, mod dil LA, PASP 50mmHg.  Marland Kitchen History of thyrotoxicosis   . Hypertension   . Hypertensive heart disease 10/18/2013  . Insulin dependent type 2 diabetes mellitus, uncontrolled ([place])   . Mediastinal  adenopathy   . MI (myocardial infarction) ([place])   . Morbid obesity with BMI of 50.0-59.9, adult ([place])   . OSA (obstructive sleep apnea) 03/06/2011    Past Surgical History:  Procedure Laterality Date  . None      Current Outpatient Medications  Medication Sig Dispense Refill  . amLODipine (NORVASC) 10 MG tablet Take 1 tablet (10 mg total) by mouth daily. 90 tablet 3  . carvedilol (COREG) 25 MG tablet Take 1 tablet (25 mg total) by mouth 2 (two) times daily. 180 tablet 3  . ferrous sulfate 325 (65 FE) MG tablet Take 1 tablet (325 mg total) 3 (three) times daily with meals by mouth. (Patient taking differently: Take 325 mg by mouth 2 (two) times daily. ) 90 tablet 2  . furosemide (LASIX) 80 MG tablet Take 1 tablet (80 mg total) by mouth daily. 30 tablet 5  . glipiZIDE (GLUCOTROL XL) 10 MG 24 hr tablet Take 1 tablet (10 mg total) by mouth daily with breakfast. 90 tablet 3  . glucose blood test strip Use as instructed 100 each 12  . losartan (COZAAR) 100 MG tablet Take 1 tablet (100 mg total) by mouth daily. 90 tablet 3  . metFORMIN (GLUCOPHAGE) 1000 MG tablet Take 1 tablet (1,000 mg total) by mouth 2 (two) times daily with a meal. 180 tablet 3  . Multiple Vitamins-Calcium (ONE-A-DAY WOMENS PO) Take 1 tablet  by mouth daily.    Marland Kitchen oxybutynin (DITROPAN XL) 15 MG 24 hr tablet Take 1 tablet (15 mg total) by mouth at bedtime. 30 tablet 3  . potassium chloride (K-DUR) 10 MEQ tablet Take 30 mEq by mouth daily.  1  . risperiDONE (RISPERDAL) 2 MG tablet TAKE 2 TABLETS BY MOUTH DAILY. (Patient taking differently: Take 2 mg by mouth at bedtime) 60 tablet 3  . rivaroxaban (XARELTO) 20 MG TABS tablet Take 1 tablet (20 mg total) by mouth daily with supper. 30 tablet 6  . VENTOLIN HFA 108 (90 Base) MCG/ACT inhaler INHALE 1 TO 2 PUFFS EVERY 6 HOURS AS NEEDED FOR WHEEZING/ SHORTNESS OF BREATH 18 g 1   No current facility-administered medications for this visit.     Allergies:   Acetaminophen; Caffeine;  Farxiga [dapagliflozin]; and Lisinopril    Social History:  The patient  reports that she has never smoked. She has never used smokeless tobacco. She reports that she does not drink alcohol or use drugs.   Family History:  The patient's family history includes Heart disease in her maternal grandfather; Heart failure in her father; Hypertension in her mother; Stroke in her father.    ROS:  Please see the history of present illness. All other systems are reviewed and negative.    PHYSICAL EXAM:    VS:  BP 128/84   Pulse (!) 105   Ht 5\' 7"  (1.702 m)   Wt (!) 346 lb 6.4 oz (157.1 kg)   SpO2 95%   BMI 54.25 kg/m  , BMI Body mass index is 54.25 kg/m. GEN: Well nourished, well developed, female in no acute distress  HEENT: normal for age  Neck: no JVD seen, difficult to assess secondary to body habitus, no carotid bruit, no masses Cardiac: Irregular rate and rhythm; no murmur, no rubs, or gallops Respiratory:  clear to auscultation bilaterally, normal work of breathing GI: soft, nontender, nondistended, + BS MS: no deformity or atrophy; no edema; distal pulses are 2+ in all 4 extremities   Skin: warm and dry, no rash Neuro:  Strength and sensation are intact Psych: euthymic mood, full affect  Oxygen saturations      At rest  96%  With exertion  70%  On 2 L of O2  95%        EKG:  EKG is ordered today. The ekg ordered today demonstrates atrial fibrillation with rapid ventricular response, heart rate 105, no significant morphology changes from 03/14/2018  ECHO: 07/2017 - Left ventricle: The cavity size was normal. Wall thickness was   increased in a pattern of moderate LVH. Systolic function was   normal. The estimated ejection fraction was in the range of 55%   to 60%. Wall motion was normal; there were no regional wall   motion abnormalities. Features are consistent with a pseudonormal   left ventricular filling pattern, with concomitant abnormal   relaxation and increased  filling pressure (grade 2 diastolic   dysfunction). - Mitral valve: Moderately to severely calcified annulus. Valve   area by continuity equation (using LVOT flow): 1.8 cm^2. - Left atrium: The atrium was moderately dilated. - Right atrium: The atrium was mildly to moderately dilated. - Tricuspid valve: There was mild-moderate regurgitation. - Pulmonary arteries: Systolic pressure was moderately increased.   PA peak pressure: 58 mm Hg (S).  Recent Labs: 08/03/2017: ALT 11 08/24/2017: Magnesium 1.6 12/09/2017: TSH 2.910 12/23/2017: B Natriuretic Peptide 285.1 03/14/2018: BUN 17; Creatinine, Ser 0.84; Hemoglobin 12.2; Platelets 241;  Potassium 3.8; Sodium 138    Lipid Panel    Component Value Date/Time   CHOL 173 08/06/2016 1245   TRIG 212 (H) 08/06/2016 1245   HDL 45 (L) 08/06/2016 1245   CHOLHDL 3.8 08/06/2016 1245   VLDL 42 (H) 08/06/2016 1245   LDLCALC 86 08/06/2016 1245     Wt Readings from Last 3 Encounters:  03/29/18 (!) 346 lb 6.4 oz (157.1 kg)  03/21/18 (!) 340 lb 12.8 oz (154.6 kg)  02/23/18 (!) 351 lb (159.2 kg)     Other studies Reviewed: Additional studies/ records that were reviewed today include: Office notes, hospital records and testing.  ASSESSMENT AND PLAN:  1.  Persistent atrial fibrillation: -The risks and benefits of the procedure have previously been explained to the patient and her mother, they are agreeable to proceeding -The importance of not missing a dose of Xarelto was emphasized, her mother will make sure she takes it every night -She was given instructions and the procedure has been scheduled -Check labs today  2.  Chronic anticoagulation: -Compliance was emphasized, no doses missed  3.  Hypoxia: - She is not short of breath at rest but has complained of dyspnea on exertion. - She does not have significant volume overload by exam - A walk test today was conducted and without very much exertion, her oxygen level dropped to 70%.  This was  confirmed by having her continue to ambulate, her oxygen saturation remained low - I do not think she has a PE as she has rarely missed doses of the Xarelto for the last 6 weeks, and has missed no doses for the last 3 weeks - Obesity hypoventilation is a concern, but she does not have trouble taking a deep breath and has good air exchange in all lung fields --I suspect her oxygen saturation also drops during sleep, have her use the oxygen overnight. -Start home O2 with exertion and nightly and refer to pulmonary  4.  Chronic diastolic CHF: - Her weight is registered as being up 6 pounds, but volume status is good by exam. -Her dyspnea on exertion has not changed -She is paying more attention to the sodium in foods and is working on portion control. -No med changes for now   Current medicines are reviewed at length with the patient today.  The patient does not have concerns regarding medicines.  The following changes have been made: Home O2  Labs/ tests ordered today include:   Orders Placed This Encounter  Procedures  . Basic metabolic panel  . CBC  . Ambulatory referral to Pulmonology  . Ambulatory Referral for DME  . EKG 12-Lead     Disposition:   FU with Dr. [name]  Signed, [name], PA-C  03/29/2018 5:04 PM    [place] Phone: [phone]; Fax: [phone]  This note was written with the assistance of speech recognition software. Please excuse any transcriptional errors.

## 2018-03-30 ENCOUNTER — Other Ambulatory Visit: Payer: Self-pay | Admitting: *Deleted

## 2018-03-30 DIAGNOSIS — I4819 Other persistent atrial fibrillation: Secondary | ICD-10-CM

## 2018-03-30 LAB — BASIC METABOLIC PANEL
BUN / CREAT RATIO: 19 (ref 9–23)
BUN: 11 mg/dL (ref 6–24)
CHLORIDE: 101 mmol/L (ref 96–106)
CO2: 22 mmol/L (ref 20–29)
Calcium: 9.6 mg/dL (ref 8.7–10.2)
Creatinine, Ser: 0.59 mg/dL (ref 0.57–1.00)
GFR calc Af Amer: 124 mL/min/{1.73_m2} (ref >59)
GFR calc non Af Amer: 107 mL/min/{1.73_m2} (ref >59)
GLUCOSE: 222 mg/dL — AB (ref 65–99)
Potassium: 4.4 mmol/L (ref 3.5–5.2)
SODIUM: 139 mmol/L (ref 134–144)

## 2018-03-30 LAB — CBC
Hematocrit: 38 % (ref 34.0–46.6)
Hemoglobin: 11.7 g/dL (ref 11.1–15.9)
MCH: 27.1 pg (ref 26.6–33.0)
MCHC: 30.8 g/dL — ABNORMAL LOW (ref 31.5–35.7)
MCV: 88 fL (ref 79–97)
PLATELETS: 276 10*3/uL (ref 150–450)
RBC: 4.32 x10E6/uL (ref 3.77–5.28)
RDW: 14.5 % (ref 12.3–15.4)
WBC: 6 10*3/uL (ref 3.4–10.8)

## 2018-03-30 NOTE — Telephone Encounter (Signed)
Unable to reach pt or leave a message, spoke with patients mother, aware of appointment date and time and instructions.

## 2018-04-05 ENCOUNTER — Encounter (HOSPITAL_COMMUNITY)
Admission: RE | Disposition: A | Discharge status: Home / Self Care | Payer: Self-pay | Source: Ambulatory Visit | Attending: Cardiology

## 2018-04-05 ENCOUNTER — Other Ambulatory Visit: Payer: Self-pay

## 2018-04-05 ENCOUNTER — Ambulatory Visit (HOSPITAL_COMMUNITY): Payer: Medicaid Other | Admitting: Certified Registered Nurse Anesthetist

## 2018-04-05 ENCOUNTER — Telehealth: Payer: Self-pay | Admitting: Physician Assistant

## 2018-04-05 ENCOUNTER — Ambulatory Visit (HOSPITAL_COMMUNITY)
Admission: RE | Admit: 2018-04-05 | Discharge: 2018-04-05 | Disposition: A | Discharge status: Home / Self Care | Payer: Medicaid Other | Source: Ambulatory Visit | Attending: Cardiology | Admitting: Cardiology

## 2018-04-05 ENCOUNTER — Encounter (HOSPITAL_COMMUNITY): Payer: Self-pay | Admitting: Certified Registered Nurse Anesthetist

## 2018-04-05 DIAGNOSIS — Z8249 Family history of ischemic heart disease and other diseases of the circulatory system: Secondary | ICD-10-CM | POA: Insufficient documentation

## 2018-04-05 DIAGNOSIS — I5032 Chronic diastolic (congestive) heart failure: Secondary | ICD-10-CM | POA: Diagnosis not present

## 2018-04-05 DIAGNOSIS — Z7901 Long term (current) use of anticoagulants: Secondary | ICD-10-CM | POA: Diagnosis not present

## 2018-04-05 DIAGNOSIS — Z6841 Body Mass Index (BMI) 40.0 and over, adult: Secondary | ICD-10-CM | POA: Insufficient documentation

## 2018-04-05 DIAGNOSIS — E662 Morbid (severe) obesity with alveolar hypoventilation: Secondary | ICD-10-CM | POA: Insufficient documentation

## 2018-04-05 DIAGNOSIS — I110 Hypertensive heart disease with heart failure: Secondary | ICD-10-CM | POA: Insufficient documentation

## 2018-04-05 DIAGNOSIS — I4819 Other persistent atrial fibrillation: Secondary | ICD-10-CM

## 2018-04-05 DIAGNOSIS — I252 Old myocardial infarction: Secondary | ICD-10-CM | POA: Diagnosis not present

## 2018-04-05 DIAGNOSIS — F319 Bipolar disorder, unspecified: Secondary | ICD-10-CM | POA: Insufficient documentation

## 2018-04-05 DIAGNOSIS — I481 Persistent atrial fibrillation: Secondary | ICD-10-CM

## 2018-04-05 DIAGNOSIS — E119 Type 2 diabetes mellitus without complications: Secondary | ICD-10-CM | POA: Diagnosis not present

## 2018-04-05 DIAGNOSIS — Z7984 Long term (current) use of oral hypoglycemic drugs: Secondary | ICD-10-CM | POA: Insufficient documentation

## 2018-04-05 HISTORY — PX: CARDIOVERSION: SHX1299

## 2018-04-05 LAB — GLUCOSE, CAPILLARY: Glucose-Capillary: 115 mg/dL — ABNORMAL HIGH (ref 70–99)

## 2018-04-05 SURGERY — CARDIOVERSION
Anesthesia: General

## 2018-04-05 MED ORDER — PROPOFOL 10 MG/ML IV BOLUS
INTRAVENOUS | Status: DC | PRN
  Administered 2018-04-05: 100 mg via INTRAVENOUS

## 2018-04-05 MED ORDER — SODIUM CHLORIDE 0.9% FLUSH: 3.0000 mL | INTRAVENOUS | Status: DC | PRN

## 2018-04-05 MED ORDER — SODIUM CHLORIDE 0.9 % IV SOLN
INTRAVENOUS | Status: DC | PRN
  Administered 2018-04-05 (×2): via INTRAVENOUS

## 2018-04-05 MED ORDER — SODIUM CHLORIDE 0.9 % IV SOLN
INTRAVENOUS | Status: DC
  Administered 2018-04-05: 14:00:00 via INTRAVENOUS

## 2018-04-05 MED ORDER — SODIUM CHLORIDE 0.9% FLUSH: 3.0000 mL | Freq: Two times a day (BID) | INTRAVENOUS | Status: DC

## 2018-04-05 MED ORDER — SODIUM CHLORIDE 0.9 % IV SOLN: 250.0000 mL | INTRAVENOUS | Status: DC

## 2018-04-05 MED ORDER — LIDOCAINE 2% (20 MG/ML) 5 ML SYRINGE
INTRAMUSCULAR | Status: DC | PRN
  Administered 2018-04-05: 100 mg via INTRAVENOUS

## 2018-04-05 NOTE — Procedures (Signed)
Electrical Cardioversion Procedure Note [name] [id number] [birth year]  Procedure: Electrical Cardioversion Indications:  Atrial Fibrillation  Procedure Details Consent: Risks of procedure as well as the alternatives and risks of each were explained to the (patient/caregiver).  Consent for procedure obtained. Time Out: Verified patient identification, verified procedure, site/side was marked, verified correct patient position, special equipment/implants available, medications/allergies/relevent history reviewed, required imaging and test results available.  Performed  Patient placed on cardiac monitor, pulse oximetry, supplemental oxygen as necessary.  Sedation given: Pt sedated by anesthesia with lidocaine 100 mg and diprovan 100 mg IV. Pacer pads placed anterior and posterior chest.  Cardioverted 3 time(s).  Cardioverted at 150J, 200J and 200J with anterior pressure.  Evaluation Findings: Post procedure EKG shows: atrial fibrillation Complications: None Patient did tolerate procedure well.  Will arrange evaluation in atrial fibrillation clinic; may need admission for tikosyn and DCCV reattempt.   [name] 04/05/2018, 1:49 PM

## 2018-04-05 NOTE — Telephone Encounter (Signed)
NEW MESSAGE   Patient states he is returning a call to nurse Darden Dates)  Please call [phone] ext [phone]

## 2018-04-05 NOTE — Interval H&P Note (Signed)
History and Physical Interval Note:  04/05/2018 1:48 PM  [name]  has presented today for surgery, with the diagnosis of AFIB  The various methods of treatment have been discussed with the patient and family. After consideration of risks, benefits and other options for treatment, the patient has consented to  Procedure(s): CARDIOVERSION (N/A) as a surgical intervention .  The patient's history has been reviewed, patient examined, no change in status, stable for surgery.  I have reviewed the patient's chart and labs.  Questions were answered to the patient's satisfaction.     [name]

## 2018-04-05 NOTE — H&P (Signed)
Office Visit   03/29/2018 [facility]    [name], Vanessa Croon, PA-C  Cardiology   Persistent atrial fibrillation ([place]) +4 more  Dx   Shortness of Breath   ; Referred by Tresa Garter, MD  Reason for Visit   Additional Documentation   Vitals:   BP 128/84   Pulse 105    Ht 5\' 7"  (1.702 m)   Wt 346 lb 6.4 oz (157.1 kg)    SpO2 95%   BMI 54.25 kg/m   BSA 2.73 m      More Vitals   Flowsheets:   Anthropometrics,   MEWS Score,   Extended Vitals     Encounter Info:   Billing Info,   History,   Allergies,   Detailed Report     All Notes   Procedures by Lonn Georgia, PA-C at 03/29/2018 1:30 PM  Author: Lonn Georgia, PA-C Author Type: Physician Assistant Certified Filed: 10/29/3084 8:59 AM  Note Status: Signed Cosign: Cosign Not Required Encounter Date: 03/29/2018  Editor: Lucilla Lame B  Procedure Orders:  1. EKG 12-Lead [[id number]] ordered by Lonn Georgia, PA-C at 03/29/18 1411         Scan on 03/30/2018 8:59 AM by Lucilla Lame B: [facility]  Progress Notes by Lonn Georgia, PA-C at 03/29/2018 1:30 PM  Author: Lonn Georgia, PA-C Author Type: Physician Assistant Certified Filed: 01/28/8412 5:23 PM  Note Status: Addendum Cosign: Cosign Not Required Encounter Date: 03/29/2018  Editor: [name] (Physician Assistant Certified)  Prior Versions: 1. [name], [name] (Physician Assistant Certified) at 11/01/4008 5:16 PM - Signed  Expand All Collapse All       Cardiology Office Note   Date:  03/29/2018   ID:  [name], [name] [birth year], MRN [id number]  PCP:  [name], NP   Cardiologist: Dr. [name], 03/18/2018 in hospital [name], PA-C 02/23/2018      Chief Complaint  Patient presents with  . Shortness of Breath    pt states having a lot of SOB with activity     History of Present Illness: [name] is a 52 y.o. female with a history of D-CHF, persistent Afib,  HTN,obesity,MV 2017 w/ nl perfusionand EF 73%, SVT, chronic intermittent chest pain, CHA2DS2-VASc=3(female, HTN, CHF) on Eliquis  5/29, seen in the office and cardioversion planned. 6/21, patient came in for the cardioversion but stated she had missed a dose of Xarelto, cardioversion canceled 6/24 office visit with PCP,, Risperdal dose had been 4 mg instead of 2 mg, was decreased, patient refused to see a therapist  [name] presents for cardiology follow up.  She still gets SOB w/ minimal activity. Because of this, does not feel like doing much. Sits all day.  No LE edema, chronic orthopnea, no PND.  Is working on portion control to eat less.   No palpitations, has not been light-headed or dizzy.  Wants the DCCV.   Mother is making sure no doses of Xarelto are missed.   No chest pain.   Says her oxygen levels have been low at times, mentions 86, not specific as to when this was.        Past Medical History:  Diagnosis Date  . Bipolar disease, chronic ([place])   . Chest pain    a. 2012 Myoview: EF 63%, no isch/infarct;  b. 04/2016 Lexiscan MV: EF 73%, no ischemia/infarct-->Low risk.  . CHF (congestive heart failure) ([place])   . Chronic diastolic  CHF (congestive heart failure) ([place])    a. 2015 Echo: EF 55-60%, Gr2 DD;  b. 09/2015 Echo: EF 60-65%, no rwma, mod dil LA, PASP 70mmHg.  Marland Kitchen History of thyrotoxicosis   . Hypertension   . Hypertensive heart disease 10/18/2013  . Insulin dependent type 2 diabetes mellitus, uncontrolled ([place])   . Mediastinal adenopathy   . MI (myocardial infarction) ([place])   . Morbid obesity with BMI of 50.0-59.9, adult ([place])   . OSA (obstructive sleep apnea) 03/06/2011         Past Surgical History:  Procedure Laterality Date  . None            Current Outpatient Medications  Medication Sig Dispense Refill  . amLODipine (NORVASC) 10 MG tablet Take 1 tablet (10 mg total) by mouth daily. 90 tablet 3  . carvedilol  (COREG) 25 MG tablet Take 1 tablet (25 mg total) by mouth 2 (two) times daily. 180 tablet 3  . ferrous sulfate 325 (65 FE) MG tablet Take 1 tablet (325 mg total) 3 (three) times daily with meals by mouth. (Patient taking differently: Take 325 mg by mouth 2 (two) times daily. ) 90 tablet 2  . furosemide (LASIX) 80 MG tablet Take 1 tablet (80 mg total) by mouth daily. 30 tablet 5  . glipiZIDE (GLUCOTROL XL) 10 MG 24 hr tablet Take 1 tablet (10 mg total) by mouth daily with breakfast. 90 tablet 3  . glucose blood test strip Use as instructed 100 each 12  . losartan (COZAAR) 100 MG tablet Take 1 tablet (100 mg total) by mouth daily. 90 tablet 3  . metFORMIN (GLUCOPHAGE) 1000 MG tablet Take 1 tablet (1,000 mg total) by mouth 2 (two) times daily with a meal. 180 tablet 3  . Multiple Vitamins-Calcium (ONE-A-DAY WOMENS PO) Take 1 tablet by mouth daily.    Marland Kitchen oxybutynin (DITROPAN XL) 15 MG 24 hr tablet Take 1 tablet (15 mg total) by mouth at bedtime. 30 tablet 3  . potassium chloride (K-DUR) 10 MEQ tablet Take 30 mEq by mouth daily.  1  . risperiDONE (RISPERDAL) 2 MG tablet TAKE 2 TABLETS BY MOUTH DAILY. (Patient taking differently: Take 2 mg by mouth at bedtime) 60 tablet 3  . rivaroxaban (XARELTO) 20 MG TABS tablet Take 1 tablet (20 mg total) by mouth daily with supper. 30 tablet 6  . VENTOLIN HFA 108 (90 Base) MCG/ACT inhaler INHALE 1 TO 2 PUFFS EVERY 6 HOURS AS NEEDED FOR WHEEZING/ SHORTNESS OF BREATH 18 g 1   No current facility-administered medications for this visit.     Allergies:   Acetaminophen; Caffeine; Farxiga [dapagliflozin]; and Lisinopril    Social History:  The patient  reports that she has never smoked. She has never used smokeless tobacco. She reports that she does not drink alcohol or use drugs.   Family History:  The patient's family history includes Heart disease in her maternal grandfather; Heart failure in her father; Hypertension in her mother; Stroke in her father.     ROS:  Please see the history of present illness. All other systems are reviewed and negative.    PHYSICAL EXAM:    VS:  BP 128/84   Pulse (!) 105   Ht 5\' 7"  (1.702 m)   Wt (!) 346 lb 6.4 oz (157.1 kg)   SpO2 95%   BMI 54.25 kg/m  , BMI Body mass index is 54.25 kg/m. GEN: Well nourished, well developed, female in no acute distress  HEENT: normal for  age  Neck: no JVD seen, difficult to assess secondary to body habitus, no carotid bruit, no masses Cardiac: Irregular rate and rhythm; no murmur, no rubs, or gallops Respiratory:  clear to auscultation bilaterally, normal work of breathing GI: soft, nontender, nondistended, + BS MS: no deformity or atrophy; no edema; distal pulses are 2+ in all 4 extremities   Skin: warm and dry, no rash Neuro:  Strength and sensation are intact Psych: euthymic mood, full affect  Oxygen saturations      At rest  96%  With exertion  70%  On 2 L of O2  95%        EKG:  EKG is ordered today. The ekg ordered today demonstrates atrial fibrillation with rapid ventricular response, heart rate 105, no significant morphology changes from 03/14/2018  ECHO: 07/2017 - Left ventricle: The cavity size was normal. Wall thickness was increased in a pattern of moderate LVH. Systolic function was normal. The estimated ejection fraction was in the range of 55% to 60%. Wall motion was normal; there were no regional wall motion abnormalities. Features are consistent with a pseudonormal left ventricular filling pattern, with concomitant abnormal relaxation and increased filling pressure (grade 2 diastolic dysfunction). - Mitral valve: Moderately to severely calcified annulus. Valve area by continuity equation (using LVOT flow): 1.8 cm^2. - Left atrium: The atrium was moderately dilated. - Right atrium: The atrium was mildly to moderately dilated. - Tricuspid valve: There was mild-moderate regurgitation. - Pulmonary arteries: Systolic  pressure was moderately increased. PA peak pressure: 58 mm Hg (S).  Recent Labs: 08/03/2017: ALT 11 08/24/2017: Magnesium 1.6 12/09/2017: TSH 2.910 12/23/2017: B Natriuretic Peptide 285.1 03/14/2018: BUN 17; Creatinine, Ser 0.84; Hemoglobin 12.2; Platelets 241; Potassium 3.8; Sodium 138    Lipid Panel Labs (Brief)          Component Value Date/Time   CHOL 173 08/06/2016 1245   TRIG 212 (H) 08/06/2016 1245   HDL 45 (L) 08/06/2016 1245   CHOLHDL 3.8 08/06/2016 1245   VLDL 42 (H) 08/06/2016 1245   LDLCALC 86 08/06/2016 1245          Wt Readings from Last 3 Encounters:  03/29/18 (!) 346 lb 6.4 oz (157.1 kg)  03/21/18 (!) 340 lb 12.8 oz (154.6 kg)  02/23/18 (!) 351 lb (159.2 kg)     Other studies Reviewed: Additional studies/ records that were reviewed today include: Office notes, hospital records and testing.  ASSESSMENT AND PLAN:  1.  Persistent atrial fibrillation: -The risks and benefits of the procedure have previously been explained to the patient and her mother, they are agreeable to proceeding -The importance of not missing a dose of Xarelto was emphasized, her mother will make sure she takes it every night -She was given instructions and the procedure has been scheduled -Check labs today  2.  Chronic anticoagulation: -Compliance was emphasized, no doses missed  3.  Hypoxia: - She is not short of breath at rest but has complained of dyspnea on exertion. - She does not have significant volume overload by exam - A walk test today was conducted and without very much exertion, her oxygen level dropped to 70%.  This was confirmed by having her continue to ambulate, her oxygen saturation remained low - I do not think she has a PE as she has rarely missed doses of the Xarelto for the last 6 weeks, and has missed no doses for the last 3 weeks - Obesity hypoventilation is a concern, but she does not  have trouble taking a deep breath and has good air exchange  in all lung fields --I suspect her oxygen saturation also drops during sleep, have her use the oxygen overnight. -Start home O2 with exertion and nightly and refer to pulmonary  4.  Chronic diastolic CHF: - Her weight is registered as being up 6 pounds, but volume status is good by exam. -Her dyspnea on exertion has not changed -She is paying more attention to the sodium in foods and is working on portion control. -No med changes for now   Current medicines are reviewed at length with the patient today.  The patient does not have concerns regarding medicines.  The following changes have been made: Home O2  Labs/ tests ordered today include:      Orders Placed This Encounter  Procedures  . Basic metabolic panel  . CBC  . Ambulatory referral to Pulmonology  . Ambulatory Referral for DME  . EKG 12-Lead     Disposition:   FU with Dr. [name]  Signed, [name], PA-C  03/29/2018 5:04 PM    [place] Phone: [phone]; Fax: [phone]  This note was written with the assistance of speech recognition software. Please excuse any transcriptional errors.     For DCCV, compliant with xarelto; no changes [name]

## 2018-04-05 NOTE — Discharge Instructions (Signed)
Electrical Cardioversion, Care After °This sheet gives you information about how to care for yourself after your procedure. Your health care provider may also give you more specific instructions. If you have problems or questions, contact your health care provider. °What can I expect after the procedure? °After the procedure, it is common to have: °· Some redness on the skin where the shocks were given. ° °Follow these instructions at home: °· Do not drive for 24 hours if you were given a medicine to help you relax (sedative). °· Take over-the-counter and prescription medicines only as told by your health care provider. °· Ask your health care provider how to check your pulse. Check it often. °· Rest for 48 hours after the procedure or as told by your health care provider. °· Avoid or limit your caffeine use as told by your health care provider. °Contact a health care provider if: °· You feel like your heart is beating too quickly or your pulse is not regular. °· You have a serious muscle cramp that does not go away. °Get help right away if: °· You have discomfort in your chest. °· You are dizzy or you feel faint. °· You have trouble breathing or you are short of breath. °· Your speech is slurred. °· You have trouble moving an arm or leg on one side of your body. °· Your fingers or toes turn cold or blue. °This information is not intended to replace advice given to you by your health care provider. Make sure you discuss any questions you have with your health care provider. °Document Released: 07/05/2013 Document Revised: 04/17/2016 Document Reviewed: 03/20/2016 °Elsevier Interactive Patient Education © 2018 [employer] ° °

## 2018-04-05 NOTE — Anesthesia Postprocedure Evaluation (Signed)
Anesthesia Post Note  Patient: [name]  Procedure(s) Performed: CARDIOVERSION (N/A )     Patient location during evaluation: PACU Anesthesia Type: General Level of consciousness: sedated and patient cooperative Pain management: pain level controlled Vital Signs Assessment: post-procedure vital signs reviewed and stable Respiratory status: spontaneous breathing Cardiovascular status: stable Anesthetic complications: no    Last Vitals:  Vitals:   04/05/18 1408 04/05/18 1418  BP: (!) 144/97 134/87  Pulse: (!) 105 95  Resp: (!) 27 20  Temp: 36.9 C   SpO2: 97% 93%    Last Pain:  Vitals:   04/05/18 1418  TempSrc:   PainSc: 0-No pain                 Nolon Nations

## 2018-04-05 NOTE — Transfer of Care (Signed)
Immediate Anesthesia Transfer of Care Note  Patient: [name]  Procedure(s) Performed: CARDIOVERSION (N/A )  Patient Location: Endoscopy Unit  Anesthesia Type:General  Level of Consciousness: awake and patient cooperative  Airway & Oxygen Therapy: Patient Spontanous Breathing and Patient connected to nasal cannula oxygen  Post-op Assessment: Report given to RN and Post -op Vital signs reviewed and stable  Post vital signs: Reviewed and stable  Last Vitals:  Vitals Value Taken Time  BP    Temp    Pulse    Resp    SpO2      Last Pain:  Vitals:   04/05/18 1321  TempSrc: Oral  PainSc: 0-No pain         Complications: No apparent anesthesia complications

## 2018-04-05 NOTE — Anesthesia Preprocedure Evaluation (Signed)
Anesthesia Evaluation  Patient identified by MRN, date of birth, ID band Patient awake    Reviewed: Allergy & Precautions, H&P , NPO status , Patient's Chart, lab work & pertinent test results, reviewed documented beta blocker date and time   Airway Mallampati: II  TM Distance: >3 FB Neck ROM: full    Dental no notable dental hx. (+) Dental Advisory Given   Pulmonary shortness of breath, sleep apnea ,    Pulmonary exam normal breath sounds clear to auscultation       Cardiovascular Exercise Tolerance: Good hypertension, Pt. on medications + Past MI and +CHF  + dysrhythmias Atrial Fibrillation  Rhythm:regular Rate:Normal     Neuro/Psych PSYCHIATRIC DISORDERS Depression Bipolar Disorder    GI/Hepatic   Endo/Other  diabetes, Insulin DependentMorbid obesity  Renal/GU   negative genitourinary   Musculoskeletal   Abdominal (+) + obese,   Peds  Hematology   Anesthesia Other Findings   Reproductive/Obstetrics                             Anesthesia Physical  Anesthesia Plan  ASA: III  Anesthesia Plan: General   Post-op Pain Management:    Induction:   PONV Risk Score and Plan: 3 and Treatment may vary due to age or medical condition and TIVA  Airway Management Planned: Nasal Cannula, Natural Airway and Mask  Additional Equipment:   Intra-op Plan:   Post-operative Plan:   Informed Consent: I have reviewed the patients History and Physical, chart, labs and discussed the procedure including the risks, benefits and alternatives for the proposed anesthesia with the patient or authorized representative who has indicated his/her understanding and acceptance.   Dental Advisory Given and Dental advisory given  Plan Discussed with: CRNA  Anesthesia Plan Comments:         Anesthesia Quick Evaluation

## 2018-04-06 ENCOUNTER — Encounter (HOSPITAL_COMMUNITY): Payer: Self-pay | Admitting: Cardiology

## 2018-04-07 ENCOUNTER — Encounter (HOSPITAL_COMMUNITY): Payer: Self-pay

## 2018-04-07 ENCOUNTER — Emergency Department (HOSPITAL_COMMUNITY): Payer: Medicaid Other

## 2018-04-07 ENCOUNTER — Telehealth (HOSPITAL_COMMUNITY): Payer: Self-pay | Admitting: *Deleted

## 2018-04-07 ENCOUNTER — Inpatient Hospital Stay (HOSPITAL_COMMUNITY)
Admission: EM | Admit: 2018-04-07 | Discharge: 2018-04-16 | DRG: 292 | Disposition: A | Discharge status: Home / Self Care | Payer: Medicaid Other | Attending: Cardiovascular Disease | Admitting: Cardiovascular Disease

## 2018-04-07 ENCOUNTER — Other Ambulatory Visit: Payer: Self-pay

## 2018-04-07 DIAGNOSIS — Z8619 Personal history of other infectious and parasitic diseases: Secondary | ICD-10-CM

## 2018-04-07 DIAGNOSIS — R0602 Shortness of breath: Secondary | ICD-10-CM | POA: Diagnosis not present

## 2018-04-07 DIAGNOSIS — Z888 Allergy status to other drugs, medicaments and biological substances status: Secondary | ICD-10-CM | POA: Diagnosis not present

## 2018-04-07 DIAGNOSIS — Z6841 Body Mass Index (BMI) 40.0 and over, adult: Secondary | ICD-10-CM | POA: Diagnosis not present

## 2018-04-07 DIAGNOSIS — I5033 Acute on chronic diastolic (congestive) heart failure: Secondary | ICD-10-CM | POA: Diagnosis present

## 2018-04-07 DIAGNOSIS — K920 Hematemesis: Secondary | ICD-10-CM | POA: Diagnosis present

## 2018-04-07 DIAGNOSIS — I110 Hypertensive heart disease with heart failure: Secondary | ICD-10-CM | POA: Diagnosis present

## 2018-04-07 DIAGNOSIS — I481 Persistent atrial fibrillation: Secondary | ICD-10-CM | POA: Diagnosis present

## 2018-04-07 DIAGNOSIS — E1165 Type 2 diabetes mellitus with hyperglycemia: Secondary | ICD-10-CM | POA: Diagnosis present

## 2018-04-07 DIAGNOSIS — Z9119 Patient's noncompliance with other medical treatment and regimen: Secondary | ICD-10-CM | POA: Diagnosis not present

## 2018-04-07 DIAGNOSIS — G4733 Obstructive sleep apnea (adult) (pediatric): Secondary | ICD-10-CM | POA: Diagnosis present

## 2018-04-07 DIAGNOSIS — Z7984 Long term (current) use of oral hypoglycemic drugs: Secondary | ICD-10-CM

## 2018-04-07 DIAGNOSIS — I472 Ventricular tachycardia: Secondary | ICD-10-CM | POA: Diagnosis not present

## 2018-04-07 DIAGNOSIS — Z79899 Other long term (current) drug therapy: Secondary | ICD-10-CM | POA: Diagnosis not present

## 2018-04-07 DIAGNOSIS — E876 Hypokalemia: Secondary | ICD-10-CM | POA: Diagnosis present

## 2018-04-07 DIAGNOSIS — Z886 Allergy status to analgesic agent status: Secondary | ICD-10-CM

## 2018-04-07 DIAGNOSIS — I10 Essential (primary) hypertension: Secondary | ICD-10-CM | POA: Diagnosis present

## 2018-04-07 DIAGNOSIS — I4819 Other persistent atrial fibrillation: Secondary | ICD-10-CM

## 2018-04-07 DIAGNOSIS — F319 Bipolar disorder, unspecified: Secondary | ICD-10-CM | POA: Diagnosis present

## 2018-04-07 DIAGNOSIS — I2721 Secondary pulmonary arterial hypertension: Secondary | ICD-10-CM | POA: Diagnosis present

## 2018-04-07 DIAGNOSIS — R0989 Other specified symptoms and signs involving the circulatory and respiratory systems: Secondary | ICD-10-CM | POA: Diagnosis not present

## 2018-04-07 DIAGNOSIS — Z8249 Family history of ischemic heart disease and other diseases of the circulatory system: Secondary | ICD-10-CM

## 2018-04-07 DIAGNOSIS — I071 Rheumatic tricuspid insufficiency: Secondary | ICD-10-CM | POA: Diagnosis present

## 2018-04-07 DIAGNOSIS — Z823 Family history of stroke: Secondary | ICD-10-CM | POA: Diagnosis not present

## 2018-04-07 DIAGNOSIS — Z7901 Long term (current) use of anticoagulants: Secondary | ICD-10-CM

## 2018-04-07 DIAGNOSIS — Z91048 Other nonmedicinal substance allergy status: Secondary | ICD-10-CM | POA: Diagnosis not present

## 2018-04-07 DIAGNOSIS — R509 Fever, unspecified: Secondary | ICD-10-CM

## 2018-04-07 HISTORY — DX: Other persistent atrial fibrillation: I48.19

## 2018-04-07 LAB — BASIC METABOLIC PANEL
ANION GAP: 9 (ref 5–15)
BUN: 7 mg/dL (ref 6–20)
CALCIUM: 9.9 mg/dL (ref 8.9–10.3)
CHLORIDE: 103 mmol/L (ref 98–111)
CO2: 27 mmol/L (ref 22–32)
Creatinine, Ser: 0.65 mg/dL (ref 0.44–1.00)
GFR calc non Af Amer: >60 mL/min (ref >60)
GLUCOSE: 223 mg/dL — AB (ref 70–99)
Potassium: 3.8 mmol/L (ref 3.5–5.1)
Sodium: 139 mmol/L (ref 135–145)

## 2018-04-07 LAB — GLUCOSE, CAPILLARY: Glucose-Capillary: 169 mg/dL — ABNORMAL HIGH (ref 70–99)

## 2018-04-07 LAB — CBC WITH DIFFERENTIAL/PLATELET
ABS IMMATURE GRANULOCYTES: 0 10*3/uL (ref 0.0–0.1)
Basophils Absolute: 0 10*3/uL (ref 0.0–0.1)
Basophils Relative: 0 %
Eosinophils Absolute: 0.1 10*3/uL (ref 0.0–0.7)
Eosinophils Relative: 1 %
HEMATOCRIT: 38.2 % (ref 36.0–46.0)
HEMOGLOBIN: 11.6 g/dL — AB (ref 12.0–15.0)
IMMATURE GRANULOCYTES: 0 %
LYMPHS ABS: 1.2 10*3/uL (ref 0.7–4.0)
LYMPHS PCT: 24 %
MCH: 26.6 pg (ref 26.0–34.0)
MCHC: 30.4 g/dL (ref 30.0–36.0)
MCV: 87.6 fL (ref 78.0–100.0)
Monocytes Absolute: 0.4 10*3/uL (ref 0.1–1.0)
Monocytes Relative: 8 %
NEUTROS ABS: 3.4 10*3/uL (ref 1.7–7.7)
NEUTROS PCT: 67 %
Platelets: 205 10*3/uL (ref 150–400)
RBC: 4.36 MIL/uL (ref 3.87–5.11)
RDW: 14.1 % (ref 11.5–15.5)
WBC: 5.2 10*3/uL (ref 4.0–10.5)

## 2018-04-07 LAB — I-STAT TROPONIN, ED: Troponin i, poc: 0 ng/mL (ref 0.00–0.08)

## 2018-04-07 LAB — PROTIME-INR
INR: 1.36
Prothrombin Time: 16.7 seconds — ABNORMAL HIGH (ref 11.4–15.2)

## 2018-04-07 LAB — SAMPLE TO BLOOD BANK

## 2018-04-07 LAB — TROPONIN I: Troponin I: <0.03 ng/mL (ref <0.03)

## 2018-04-07 MED ORDER — ALBUTEROL SULFATE (2.5 MG/3ML) 0.083% IN NEBU: 3.0000 mL | INHALATION_SOLUTION | Freq: Four times a day (QID) | RESPIRATORY_TRACT | Status: DC | PRN

## 2018-04-07 MED ORDER — RISPERIDONE 2 MG PO TABS
4.0000 mg | ORAL_TABLET | Freq: Every day | ORAL | Status: DC
  Administered 2018-04-07 – 2018-04-15 (×9): 4 mg via ORAL
  Filled 2018-04-07 (×9): qty 2

## 2018-04-07 MED ORDER — INSULIN ASPART 100 UNIT/ML ~~LOC~~ SOLN
0.0000 [IU] | Freq: Three times a day (TID) | SUBCUTANEOUS | Status: DC
  Administered 2018-04-08 – 2018-04-09 (×4): 3 [IU] via SUBCUTANEOUS
  Administered 2018-04-09: 5 [IU] via SUBCUTANEOUS
  Administered 2018-04-09 – 2018-04-10 (×2): 3 [IU] via SUBCUTANEOUS
  Administered 2018-04-10: 5 [IU] via SUBCUTANEOUS
  Administered 2018-04-10 – 2018-04-11 (×2): 3 [IU] via SUBCUTANEOUS
  Administered 2018-04-11 – 2018-04-13 (×3): 2 [IU] via SUBCUTANEOUS
  Administered 2018-04-13: 5 [IU] via SUBCUTANEOUS
  Administered 2018-04-13: 3 [IU] via SUBCUTANEOUS
  Administered 2018-04-14 (×2): 5 [IU] via SUBCUTANEOUS
  Administered 2018-04-14: 3 [IU] via SUBCUTANEOUS

## 2018-04-07 MED ORDER — ONDANSETRON HCL 4 MG/2ML IJ SOLN
4.0000 mg | Freq: Four times a day (QID) | INTRAMUSCULAR | Status: DC | PRN
  Administered 2018-04-13 – 2018-04-14 (×2): 4 mg via INTRAVENOUS
  Filled 2018-04-07 (×3): qty 2

## 2018-04-07 MED ORDER — RIVAROXABAN 20 MG PO TABS
20.0000 mg | ORAL_TABLET | Freq: Every day | ORAL | Status: DC
  Administered 2018-04-07 – 2018-04-15 (×9): 20 mg via ORAL
  Filled 2018-04-07 (×9): qty 1

## 2018-04-07 MED ORDER — GLIPIZIDE ER 10 MG PO TB24
10.0000 mg | ORAL_TABLET | Freq: Every day | ORAL | Status: DC
  Administered 2018-04-08 – 2018-04-16 (×9): 10 mg via ORAL
  Filled 2018-04-07 (×9): qty 1

## 2018-04-07 MED ORDER — NITROGLYCERIN 0.4 MG SL SUBL
0.4000 mg | SUBLINGUAL_TABLET | SUBLINGUAL | Status: DC | PRN
  Administered 2018-04-15 (×2): 0.4 mg via SUBLINGUAL
  Filled 2018-04-07 (×2): qty 1

## 2018-04-07 MED ORDER — FUROSEMIDE 10 MG/ML IJ SOLN
40.0000 mg | Freq: Two times a day (BID) | INTRAMUSCULAR | Status: DC
  Administered 2018-04-07 – 2018-04-10 (×6): 40 mg via INTRAVENOUS
  Filled 2018-04-07 (×7): qty 4

## 2018-04-07 MED ORDER — POTASSIUM CHLORIDE CRYS ER 10 MEQ PO TBCR
15.0000 meq | EXTENDED_RELEASE_TABLET | Freq: Two times a day (BID) | ORAL | Status: DC
  Administered 2018-04-07: 15 meq via ORAL
  Filled 2018-04-07: qty 2

## 2018-04-07 MED ORDER — SODIUM CHLORIDE 0.9% FLUSH
3.0000 mL | Freq: Two times a day (BID) | INTRAVENOUS | Status: DC
  Administered 2018-04-07 – 2018-04-16 (×16): 3 mL via INTRAVENOUS

## 2018-04-07 MED ORDER — CARVEDILOL 25 MG PO TABS
25.0000 mg | ORAL_TABLET | Freq: Two times a day (BID) | ORAL | Status: DC
  Administered 2018-04-07 – 2018-04-16 (×17): 25 mg via ORAL
  Filled 2018-04-07 (×22): qty 1
  Filled 2018-04-07: qty 2

## 2018-04-07 MED ORDER — ACETAMINOPHEN 325 MG PO TABS: 650.0000 mg | ORAL_TABLET | ORAL | Status: DC | PRN

## 2018-04-07 MED ORDER — FERROUS SULFATE 325 (65 FE) MG PO TABS
325.0000 mg | ORAL_TABLET | Freq: Two times a day (BID) | ORAL | Status: DC
  Administered 2018-04-07 – 2018-04-16 (×18): 325 mg via ORAL
  Filled 2018-04-07 (×18): qty 1

## 2018-04-07 MED ORDER — SODIUM CHLORIDE 0.9 % IV SOLN: 250.0000 mL | INTRAVENOUS | Status: DC | PRN

## 2018-04-07 MED ORDER — ZOLPIDEM TARTRATE 5 MG PO TABS
5.0000 mg | ORAL_TABLET | Freq: Every evening | ORAL | Status: DC | PRN
  Filled 2018-04-07: qty 1

## 2018-04-07 MED ORDER — INSULIN ASPART 100 UNIT/ML ~~LOC~~ SOLN
0.0000 [IU] | Freq: Every day | SUBCUTANEOUS | Status: DC
  Administered 2018-04-12 – 2018-04-13 (×2): 2 [IU] via SUBCUTANEOUS

## 2018-04-07 MED ORDER — ALPRAZOLAM 0.25 MG PO TABS
0.2500 mg | ORAL_TABLET | Freq: Two times a day (BID) | ORAL | Status: DC | PRN
  Administered 2018-04-08 – 2018-04-15 (×4): 0.25 mg via ORAL
  Filled 2018-04-07 (×4): qty 1

## 2018-04-07 MED ORDER — METFORMIN HCL 500 MG PO TABS
1000.0000 mg | ORAL_TABLET | Freq: Two times a day (BID) | ORAL | Status: DC
  Administered 2018-04-07 – 2018-04-16 (×18): 1000 mg via ORAL
  Filled 2018-04-07 (×18): qty 2

## 2018-04-07 MED ORDER — AMLODIPINE BESYLATE 10 MG PO TABS
10.0000 mg | ORAL_TABLET | Freq: Every day | ORAL | Status: DC
  Administered 2018-04-08 – 2018-04-12 (×5): 10 mg via ORAL
  Filled 2018-04-07 (×6): qty 1

## 2018-04-07 MED ORDER — LOSARTAN POTASSIUM 50 MG PO TABS
100.0000 mg | ORAL_TABLET | Freq: Every day | ORAL | Status: DC
  Administered 2018-04-07 – 2018-04-16 (×9): 100 mg via ORAL
  Filled 2018-04-07 (×11): qty 2

## 2018-04-07 MED ORDER — OXYBUTYNIN CHLORIDE ER 15 MG PO TB24
15.0000 mg | ORAL_TABLET | Freq: Every day | ORAL | Status: DC
  Administered 2018-04-07 – 2018-04-15 (×9): 15 mg via ORAL
  Filled 2018-04-07 (×10): qty 1

## 2018-04-07 MED ORDER — SODIUM CHLORIDE 0.9% FLUSH: 3.0000 mL | INTRAVENOUS | Status: DC | PRN

## 2018-04-07 NOTE — ED Provider Notes (Signed)
Patient placed in Quick Look pathway, seen and evaluated   Chief Complaint: Vomiting blood  HPI:   [name] presents today for vomiting blood.  She reports that it started last night.  She has had this in the past when on xarelto and she is on xarelto.  She reports that she has been vomiting clots.  She reports shortness of breath and central chest pain.  She had a cardioversion for a-fib on the 9th.   ROS: No fevers.    Physical Exam:   Gen: No distress  Neuro: Awake and Alert  Skin: Warm    Focused Exam: Lungs CTAB.     Initiation of care has begun. The patient has been counseled on the process, plan, and necessity for staying for the completion/evaluation, and the remainder of the medical screening examination    Ollen Gross 04/07/18 1339    Charlesetta Shanks, MD 04/10/18 [phone]

## 2018-04-07 NOTE — Progress Notes (Signed)
Patient arrived to unit, ambulated to ned. Patient updated on plan of care and unit protocols, safety precautions in place. Call bell within reach. Snack given.

## 2018-04-07 NOTE — ED Triage Notes (Signed)
Pt endorses being on xarelto and having recent cardioversion for a-fib, has been having episodes of vomiting blood x 2 days. VSS. In a-fib.

## 2018-04-07 NOTE — H&P (Addendum)
Cardiology History and Physical:   Patient ID: [name]; [id number]; [birth year]   Admit date: 04/07/2018 Date of Consult: 04/07/2018  Primary Care Provider: [name], NP Primary Cardiologist: Dr [name], 07/09, in-hospital Primary Electrophysiologist: Maximino Greenland, NP, 12/23/2017    Patient Profile:   [name] is a 52 y.o. female with a hx of D-CHF, persistent Afib, HTN,obesity,MV 2017 w/ nl perfusionand EF 73%, SVT, chronic intermittent chest pain, CHA2DS2-VASc=3(female, HTN, CHF) on Xarelto, hypertensive heart disease, OSA, mediastinal adenopathy, basilar groundglass infiltrates, and prior Mycobacterium avium infection, who is being seen today for the evaluation of atrial fib at the request of Dr Francia Greaves.  History of Present Illness:   [name] was seen in the office 07/02. Pt hypoxic w/ exertion, but not able to get O2 covered. Wt up slightly, but volume status good by exam. Compliance w/ anticoagulation good. Still in Afib, DCCV scheduled.  07/09, pt came in and had DCCV total 3 times, 3rd time at 200J w/ anterior pressure. Tolerated well but remained in Afib>>referred to Afib clinic but has not been seen yet.  Today, patient woke up with a blood clot in her mouth.  Per her mother, it was not that big.  This was concerning to her and she came into the emergency room.  She has not been coughing or vomiting.  No known dark or tarry stools.  She reports chest pain, it hurts to take a deep breath.  This is different from the previous chest pain which she has had in the past.  That chest pain was associated with chest wall tenderness in the left midsternal area.  She has been tracking her weight, states it has been increasing but cannot tell me any specific numbers.  She feels she is doing much better on the salt.  Her mother is doing the cooking and states they are doing a good job and are reading labels as well as not adding salt.  She is drinking less water than  she used to, but states she is drinking 3 L a day.  She describes a large bottle.  She is limiting herself to that much liquid.  She has not had any of her medications today.   Past Medical History:  Diagnosis Date  . Bipolar disease, chronic ([place])   . Chest pain    a. 2012 Myoview: EF 63%, no isch/infarct;  b. 04/2016 Lexiscan MV: EF 73%, no ischemia/infarct-->Low risk.  . Chronic diastolic CHF (congestive heart failure) ([place])    a. 2015 Echo: EF 55-60%, Gr2 DD;  b. 09/2015 Echo: EF 60-65%, no rwma, mod dil LA, PASP 3mmHg.  Marland Kitchen History of thyrotoxicosis   . Hypertension   . Hypertensive heart disease 10/18/2013  . Insulin dependent type 2 diabetes mellitus, uncontrolled ([place])   . Mediastinal adenopathy   . Morbid obesity with BMI of 50.0-59.9, adult ([place])   . OSA (obstructive sleep apnea) 03/06/2011  . Persistent atrial fibrillation ([place]) 12/09/2017    Past Surgical History:  Procedure Laterality Date  . CARDIOVERSION N/A 04/05/2018   Procedure: CARDIOVERSION;  Surgeon: Lelon Perla, MD;  Location: [facility] ENDOSCOPY;  Service: Cardiovascular;  Laterality: N/A;  . None       Prior to Admission medications   Medication Sig Start Date End Date Taking? Authorizing Provider  amLODipine (NORVASC) 10 MG tablet Take 1 tablet (10 mg total) by mouth daily. 03/24/17  Yes Tresa Garter, MD  carvedilol (COREG) 25 MG tablet Take 1 tablet (25 mg total)  by mouth 2 (two) times daily. 04/12/17  Yes Lelon Perla, MD  ferrous sulfate 325 (65 FE) MG tablet Take 1 tablet (325 mg total) 3 (three) times daily with meals by mouth. Patient taking differently: Take 325 mg by mouth 2 (two) times daily.  08/13/17  Yes Barton Dubois, MD  furosemide (LASIX) 80 MG tablet Take 1 tablet (80 mg total) by mouth daily. 08/26/17  Yes Strader, Catlin, PA-C  glipiZIDE (GLUCOTROL XL) 10 MG 24 hr tablet Take 1 tablet (10 mg total) by mouth daily with breakfast. 03/24/17  Yes Jegede, Olugbemiga E, MD  losartan  (COZAAR) 100 MG tablet Take 1 tablet (100 mg total) by mouth daily. 03/24/17  Yes Tresa Garter, MD  metFORMIN (GLUCOPHAGE) 1000 MG tablet Take 1 tablet (1,000 mg total) by mouth 2 (two) times daily with a meal. 03/24/17  Yes Jegede, Olugbemiga E, MD  oxybutynin (DITROPAN XL) 15 MG 24 hr tablet Take 1 tablet (15 mg total) by mouth at bedtime. 03/21/18  Yes [name], NP  potassium chloride SA (KLOR-CON M15) 15 MEQ tablet Take 15 mEq by mouth 2 (two) times daily.   Yes [provider]  risperiDONE (RISPERDAL) 2 MG tablet TAKE 2 TABLETS BY MOUTH DAILY. Patient taking differently: Take 4 mg by mouth at bedtime 11/23/17  Yes Jegede, Olugbemiga E, MD  rivaroxaban (XARELTO) 20 MG TABS tablet Take 1 tablet (20 mg total) by mouth daily with supper. 03/21/18 04/20/18 Yes [name], NP  VENTOLIN HFA 108 (90 Base) MCG/ACT inhaler INHALE 1 TO 2 PUFFS EVERY 6 HOURS AS NEEDED FOR WHEEZING/ SHORTNESS OF BREATH 09/13/17  Yes Jegede, Olugbemiga E, MD  glucose blood test strip Use as instructed 01/06/17   Tresa Garter, MD  Multiple Vitamins-Calcium (ONE-A-DAY WOMENS PO) Take 1 tablet by mouth daily.    [provider]    Inpatient Medications: Scheduled Meds:  Continuous Infusions:  PRN Meds:   Allergies:    Allergies  Allergen Reactions  . Acetaminophen Other (See Comments)    Seizure-like "fits" as a child  . Caffeine     Tense, anxiety, increased urination  . [place] [Dapagliflozin] Other (See Comments)    Hallucinations, drop in blood sugar  . Lisinopril Rash    Rash with lisinopril; but fosinopril is ok per patient    Social History:   Social History   Socioeconomic History  . Marital status: Single    Spouse name: Not on file  . Number of children: 0  . Years of education: 72  . Highest education level: Not on file  Occupational History  . Occupation: unemployed  Social Needs  . Financial resource strain: Not on file  . Food insecurity:     Worry: Not on file    Inability: Not on file  . Transportation needs:    Medical: Not on file    Non-medical: Not on file  Tobacco Use  . Smoking status: Never Smoker  . Smokeless tobacco: Never Used  Substance and Sexual Activity  . Alcohol use: No  . Drug use: No  . Sexual activity: Not Currently    Birth control/protection: None  Lifestyle  . Physical activity:    Days per week: Not on file    Minutes per session: Not on file  . Stress: Not on file  Relationships  . Social connections:    Talks on phone: Not on file    Gets together: Not on file    Attends  religious service: Not on file    Active member of club or organization: Not on file    Attends meetings of clubs or organizations: Not on file    Relationship status: Not on file  . Intimate partner violence:    Fear of current or ex partner: Not on file    Emotionally abused: Not on file    Physically abused: Not on file    Forced sexual activity: Not on file  Other Topics Concern  . 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 But ran out of money and is no longer attending school. (Note patient has bipolar disorder).      Born in [place] but lived in [place] before coming back to [place].       Primary language is Arabic. Lives with mother and brother.    Family History:   Family History  Problem Relation Age of Onset  . Heart failure Father   . Stroke Father   . Hypertension Mother   . Heart disease Maternal Grandfather    Family Status:  Family Status  Relation Name Status  . Father  Deceased at age 63       Died from stroke. Also had heart failure  . Mother  Alive       HTN  . Brother  (Not Specified)       HTN  . MGF  Deceased    ROS:  Please see the history of present illness.  All other ROS reviewed and negative.     Physical Exam/Data:   Vitals:   04/07/18 1341  BP: (!) 155/86    Pulse: (!) 101  Resp: 18  Temp: 98.5 F (36.9 C)  TempSrc: Oral  SpO2: 94%  Weight: (!) 346 lb (156.9 kg)  Height: 5\' 7"  (1.702 m)   No intake or output data in the 24 hours ending 04/07/18 1735 Filed Weights   04/07/18 1341  Weight: (!) 346 lb (156.9 kg)   Body mass index is 54.19 kg/m.  General:  Well nourished, well developed, in no acute distress HEENT: normal Lymph: no adenopathy Neck: no JVD seen, difficult to assess secondary to body habitus Endocrine:  No thryomegaly Vascular: No carotid bruits; 4/4 extremity pulses 2+, without bruits  Cardiac:  normal S1, S2; irregular rate and rhythm; no murmur  Lungs: Rales bases bilaterally, no wheezing, rhonchi    Abd: soft, nontender, no hepatomegaly  Ext: 2+ lower extremity edema Musculoskeletal:  No deformities, BUE and BLE strength normal and equal Skin: warm and dry  Neuro:  CNs 2-12 intact, no focal abnormalities noted Psych:  Normal affect   EKG:  The EKG was personally reviewed and demonstrates: 7/11, atrial fibrillation, RVR with a heart rate of 102, no acute ischemic changes and no change from 03/29/2018 Telemetry:  Telemetry was personally reviewed and demonstrates: Atrial fibrillation, heart rate generally controlled  Relevant CV Studies:  ECHO: 07/2017 - Left ventricle: The cavity size was normal. Wall thickness was increased in a pattern of moderate LVH. Systolic function was normal. The estimated ejection fraction was in the range of 55% to 60%. Wall motion was normal; there were no regional wall motion abnormalities. Features are consistent with a pseudonormal left ventricular filling pattern, with concomitant abnormal relaxation and increased filling pressure (grade 2 diastolic dysfunction). - Mitral valve: Moderately to severely calcified annulus.  Valve area by continuity equation (using LVOT flow): 1.8 cm^2. - Left atrium: The atrium was moderately dilated. - Right atrium: The atrium was  mildly to moderately dilated. - Tricuspid valve: There was mild-moderate regurgitation. - Pulmonary arteries: Systolic pressure was moderately increased. PA peak pressure: 58 mm Hg (S).   Laboratory Data:  Chemistry Recent Labs  Lab 04/07/18 1345  NA 139  K 3.8  CL 103  CO2 27  GLUCOSE 223*  BUN 7  CREATININE 0.65  CALCIUM 9.9  GFRNONAA >60  GFRAA >60  ANIONGAP 9    Lab Results  Component Value Date   ALT 11 (L) 08/03/2017   AST 17 08/03/2017   ALKPHOS 103 08/03/2017   BILITOT 0.5 08/03/2017   Hematology Recent Labs  Lab 04/07/18 1345  WBC 5.2  RBC 4.36  HGB 11.6*  HCT 38.2  MCV 87.6  MCH 26.6  MCHC 30.4  RDW 14.1  PLT 205   Cardiac EnzymesNo results for input(s): TROPONINI in the last 168 hours.  Recent Labs  Lab 04/07/18 1401  TROPIPOC 0.00    BNPNo results for input(s): BNP, PROBNP in the last 168 hours.   TSH:  Lab Results  Component Value Date   TSH 2.910 12/09/2017   Lipids: Lab Results  Component Value Date   CHOL 173 08/06/2016   HDL 45 (L) 08/06/2016   LDLCALC 86 08/06/2016   TRIG 212 (H) 08/06/2016   CHOLHDL 3.8 08/06/2016   HgbA1c: Lab Results  Component Value Date   HGBA1C 9.3 (A) 03/21/2018   Magnesium:  Magnesium  Date Value Ref Range Status  08/24/2017 1.6 1.6 - 2.3 mg/dL Final     Radiology/Studies:  Dg Chest 2 View  Result Date: 04/07/2018 CLINICAL DATA:  Recent cardioversion for atrial fibrillation. Chest pain and shortness of breath. EXAM: CHEST - 2 VIEW COMPARISON:  12/23/2017 FINDINGS: Cardiac silhouette is markedly enlarged consistent with cardiomegaly and/or pericardial fluid. There is pulmonary venous hypertension with interstitial edema. No effusions. No focal pulmonary finding. No acute bone finding. IMPRESSION: Enlarged cardiac silhouette consistent with cardiomegaly and/or pericardial effusion. Pulmonary venous hypertension with interstitial pulmonary edema. Electronically Signed   By: Nelson Chimes M.D.    On: 04/07/2018 16:10    Assessment and Plan:   Principal Problem: 1.  Acute on chronic diastolic congestive heart failure (HCC) -Volume status has worsened, needs diuresis. - I believe she will require admission for IV diuresis - Follow electrolytes and renal function daily - Dry weight unclear, they have scale and agreed to chart weights and bring the chart to follow-up visits.  Active Problems: 2.  Blood clot - Unclear source as she woke up with it - No significant change in hemoglobin or hematocrit -Follow, it may be something as simple as she bit her tongue. -Continue Xarelto for now.  3. Essential hypertension -Follow on current medications  4.  Uncontrolled type 2 diabetes mellitus with hyperglycemia (HCC) - Renal function is normal, no dye studies planned.  Continue home medications with sliding scale insulin. - Hemoglobin A1c was elevated at 9.3, nutritionist consult.  Her mother is helping with the cooking, need to speak to both at once.  5.  Chest pain: - She is having chest pain with deep inspiration, different from previous chest pain. - Previous stress test have been normal and previous cardiac enzymes have been normal - Cycle cardiac enzymes and recheck an echocardiogram.  Unless enzymes are significantly elevated or her EF is decreased, no ischemic evaluation at  this time.   For questions or updates, please contact [place] Please consult [email or web address] for contact info under Cardiology/STEMI.   Signed, [name], PA-C  04/07/2018 5:35 PM   Attending Note:   The patient was seen and examined.  Agree with assessment and plan as noted above.  Changes made to the above note as needed.  Patient seen and independently examined with [name], PA .   We discussed all aspects of the encounter. I agree with the assessment and plan as stated above.  1.  Acute on chronic diastolic congestive heart failure: The patient originally presented to the  emergency room with a blood clot in her mouth.  She was found to have volume overload while she was here.  She complains of shortness of breath with exertion and has had some leg edema.  She is morbidly obese.  She has normal left ventricular systolic function with an EF of 55 to 60%.  She has grade 2 diastolic dysfunction.   She has moderate pulmonary artery hypertension with an estimated PA pressure of 58.  We will admit her.  We will give her IV diuresis.  2.  Pulmonary hypertension: She has an estimated PA pressure of 58.  I suspect that she has obesity hypoventilation and she also has known obstructive sleep apnea.  She has not tolerated CPAP in the past.  We will need to try her on different forms of CPAP.  She desperately needs to lose weight.  I suspect that this pulmonary hypertension is contributing greatly to her shortness of breath and to her leg edema.  3.  Morbid obesity: The patient is adamant that she does not eat a lot of salt.  She clearly overeats.  4.  Insulin-dependent diabetes mellitus: Continue home medications.  5.  Atrial fibrillation: She is had multiple cardioversions.  She has been referred to the atrial fibrillation clinic but has not made that appointment yet. She has obstructive sleep apnea which is certainly contributing to her atrial fibrillation.  To new Xarelto.  The small blood clot in her lungs should not be a serious issue.    I have spent a total of 40 minutes with patient reviewing hospital  notes , telemetry, EKGs, labs and examining patient as well as establishing an assessment and plan that was discussed with the patient. > 50% of time was spent in direct patient care.    Thayer Headings, Brooke Bonito., MD, [facility] 04/07/2018, 6:02 PM [address]  [place] Pager [phone]

## 2018-04-07 NOTE — Plan of Care (Signed)
  Problem: Education: Goal: Knowledge of General Education information will improve Outcome: Progressing   Problem: Health Behavior/Discharge Planning: Goal: Ability to manage health-related needs will improve Outcome: Progressing   Problem: Clinical Measurements: Goal: Respiratory complications will improve Outcome: Progressing   Problem: Coping: Goal: Level of anxiety will decrease Outcome: Progressing

## 2018-04-07 NOTE — Telephone Encounter (Signed)
Pt currently in ER -- will follow up with afib clinic appt once discharge plan known.

## 2018-04-07 NOTE — ED Provider Notes (Signed)
[facility] EMERGENCY DEPARTMENT Provider Note  CSN: [id number] Arrival date & time: 04/07/18  1329  History   Chief Complaint Chief Complaint  Patient presents with  . Hematemesis  . Shortness of Breath  . Chest Pain   HPI [name] is a 52 y.o. female with a medical history of atrial fibrillation, HF,  who presented to the ED for SOB and chest pain x2 days. Patient states these symptoms began after her cardioversion on 04/05/18. She also reports coughing up a blood clot this morning x1. Denies fever, chills, diaphoresis and palpitations. She also reports leg swelling which began 1 hour prior to ED arrival. Denies neuro symptoms of slurred speech, paresthesias, weakness,   Additional history obtained by medical chart. Prior to cardioversion, patient has no chest pain or edema. Patient has been compliant with anticoagulant.   Past Medical History:  Diagnosis Date  . Atrial fibrillation ([place])   . Bipolar disease, chronic ([place])   . Chest pain    a. 2012 Myoview: EF 63%, no isch/infarct;  b. 04/2016 Lexiscan MV: EF 73%, no ischemia/infarct-->Low risk.  . CHF (congestive heart failure) ([place])   . Chronic diastolic CHF (congestive heart failure) ([place])    a. 2015 Echo: EF 55-60%, Gr2 DD;  b. 09/2015 Echo: EF 60-65%, no rwma, mod dil LA, PASP 53mHg.  .Marland KitchenHistory of thyrotoxicosis   . Hypertension   . Hypertensive heart disease 10/18/2013  . Insulin dependent type 2 diabetes mellitus, uncontrolled ([place]   . Mediastinal adenopathy   . MI (myocardial infarction) ([place]   . Morbid obesity with BMI of 50.0-59.9, adult ([place]   . OSA (obstructive sleep apnea) 03/06/2011    Patient Active Problem List   Diagnosis Date Noted  . Uncontrolled type 2 diabetes mellitus with hyperglycemia ([place]   . Hypokalemia   . Cor pulmonale (chronic) ([place]   . Acute on chronic diastolic CHF (congestive heart failure) ([place] 08/03/2017  . 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 Pyrexia     . Dyspnea 11/13/2015  . Mediastinal adenopathy 11/13/2015  . Acute on chronic diastolic heart failure ([place] 11/13/2015  . Abnormal CT scan, chest 11/11/2015  . Chest pain 11/11/2015  . Acute diastolic CHF (congestive heart failure) ([place] 10/03/2015  . Essential hypertension 03/07/2015  . Depression (emotion) 03/07/2015  . Noninfectious gastroenteritis and colitis 01/02/2015  . Sinusitis, chronic 01/02/2015  . Midline low back pain without sciatica 09/10/2014  . Bipolar 1 disorder, mixed, moderate (HP[place] 07/02/2014  . Stress incontinence 07/02/2014  . Mania ([place] 12/10/2013  . Speech abnormality 12/08/2013  . SVT (supraventricular tachycardia) ([place] 12/06/2013  . Benign essential HTN 11/28/2013  . HTN (hypertension) 11/28/2013  . Diabetes ([place] 11/28/2013  . Pulmonary HTN, moderate to severe 11/03/2013  . Acute on chronic diastolic congestive heart failure ([place] 11/02/2013  . Hypertensive heart disease 10/18/2013  . IDDM (insulin dependent diabetes mellitus) (HE[place] 10/18/2013  . Chronic diastolic heart failure (HW[place] 07/23/2011  . OSA (obstructive sleep apnea)- non compliant with C-pap 03/06/2011  . Morbid obesity due to excess calories (HP[place] 02/19/2011  . Bipolar disorder     Past Surgical History:  Procedure Laterality Date  . CARDIOVERSION N/A 04/05/2018   Procedure: CARDIOVERSION;  Surgeon: CLelon Perla MD;  [facility];  Service: Cardiovascular;  Laterality: N/A;  . None       OB History   None      Home Medications    Prior to Admission medications   Medication Sig  Start Date End Date Taking? Authorizing Provider  amLODipine (NORVASC) 10 MG tablet Take 1 tablet (10 mg total) by mouth daily. 03/24/17  Yes Tresa Garter, MD  carvedilol (COREG) 25 MG tablet Take 1 tablet (25 mg total) by mouth 2 (two) times daily. 04/12/17  Yes Lelon Perla, MD  ferrous sulfate 325 (65 FE) MG tablet Take 1 tablet (325 mg total) 3 (three) times daily with meals  by mouth. Patient taking differently: Take 325 mg by mouth 2 (two) times daily.  08/13/17  Yes Barton Dubois, MD  furosemide (LASIX) 80 MG tablet Take 1 tablet (80 mg total) by mouth daily. 08/26/17  Yes Strader, [place], PA-C  glipiZIDE (GLUCOTROL XL) 10 MG 24 hr tablet Take 1 tablet (10 mg total) by mouth daily with breakfast. 03/24/17  Yes Jegede, Olugbemiga E, MD  losartan (COZAAR) 100 MG tablet Take 1 tablet (100 mg total) by mouth daily. 03/24/17  Yes Tresa Garter, MD  metFORMIN (GLUCOPHAGE) 1000 MG tablet Take 1 tablet (1,000 mg total) by mouth 2 (two) times daily with a meal. 03/24/17  Yes Jegede, Olugbemiga E, MD  oxybutynin (DITROPAN XL) 15 MG 24 hr tablet Take 1 tablet (15 mg total) by mouth at bedtime. 03/21/18  Yes [name], NP  potassium chloride SA (KLOR-CON M15) 15 MEQ tablet Take 15 mEq by mouth 2 (two) times daily.   Yes [provider]  risperiDONE (RISPERDAL) 2 MG tablet TAKE 2 TABLETS BY MOUTH DAILY. Patient taking differently: Take 4 mg by mouth at bedtime 11/23/17  Yes Jegede, Olugbemiga E, MD  rivaroxaban (XARELTO) 20 MG TABS tablet Take 1 tablet (20 mg total) by mouth daily with supper. 03/21/18 04/20/18 Yes [name], NP  VENTOLIN HFA 108 (90 Base) MCG/ACT inhaler INHALE 1 TO 2 PUFFS EVERY 6 HOURS AS NEEDED FOR WHEEZING/ SHORTNESS OF BREATH 09/13/17  Yes Jegede, Olugbemiga E, MD  glucose blood test strip Use as instructed 01/06/17   Tresa Garter, MD  Multiple Vitamins-Calcium (ONE-A-DAY WOMENS PO) Take 1 tablet by mouth daily.    [provider]    Family History Family History  Problem Relation Age of Onset  . Heart failure Father   . Stroke Father   . Hypertension Mother   . Heart disease Maternal Grandfather     Social History Social History   Tobacco Use  . Smoking status: Never Smoker  . Smokeless tobacco: Never Used  Substance Use Topics  . Alcohol use: No  . Drug use: No     Allergies   Acetaminophen;  Caffeine; Farxiga [dapagliflozin]; and Lisinopril   Review of Systems Review of Systems  Constitutional: Negative for activity change, appetite change, chills, fatigue and fever.  HENT: Negative.   Eyes: Negative for visual disturbance.  Respiratory: Positive for shortness of breath. Negative for cough and chest tightness.   Cardiovascular: Positive for chest pain and leg swelling. Negative for palpitations.  Gastrointestinal: Negative.   Musculoskeletal: Negative.   Skin: Negative.   Allergic/Immunologic: Negative.   Neurological: Negative for dizziness, weakness, light-headedness, numbness and headaches.     Physical Exam Updated Vital Signs BP (!) 155/86 (BP Location: Left Arm)   Pulse (!) 101   Temp 98.5 F (36.9 C) (Oral)   Resp 18   Ht _0  (1.702 m)   Wt (!) 156.9 kg (346 lb)   SpO2 94%   BMI 54.19 kg/m   Physical Exam  Constitutional: She is cooperative. She does not  appear ill. No distress.  Eyes: Pupils are equal, round, and reactive to light. Conjunctivae, EOM and lids are normal.  Neck: Normal carotid pulses present. Carotid bruit is not present.  Cardiovascular: Intact distal pulses. An irregularly irregular rhythm present.  Pulmonary/Chest: Effort normal and breath sounds normal. No tachypnea. No respiratory distress.  Abdominal: Soft. Normal appearance and bowel sounds are normal. There is no tenderness.  Lymphadenopathy:  1+ edema to lower shins  Neurological: She is alert. She has normal strength. No cranial nerve deficit or sensory deficit. She exhibits normal muscle tone.  Skin: Skin is warm and intact. Capillary refill takes less than 2 seconds. She is not diaphoretic. No pallor.     ED Treatments / Results  Labs (all labs ordered are listed, but only abnormal results are displayed) Labs Reviewed  PROTIME-INR - Abnormal; Notable for the following components:      Result Value   Prothrombin Time 16.7 (*)    All other components within normal  limits  CBC WITH DIFFERENTIAL/PLATELET - Abnormal; Notable for the following components:   Hemoglobin 11.6 (*)    All other components within normal limits  BASIC METABOLIC PANEL - Abnormal; Notable for the following components:   Glucose, Bld 223 (*)    All other components within normal limits  I-STAT TROPONIN, ED  SAMPLE TO BLOOD BANK    EKG EKG Interpretation  Date/Time:  Thursday April 07 2018 15:21:05 EDT Ventricular Rate:  102 PR Interval:    QRS Duration: 88 QT Interval:  354 QTC Calculation: 462 R Axis:   129 Text Interpretation:  Atrial fibrillation Anterolateral infarct, old Confirmed by Dene Gentry [phone]) on 04/07/2018 4:01:57 PM   Radiology Dg Chest 2 View  Result Date: 04/07/2018 CLINICAL DATA:  Recent cardioversion for atrial fibrillation. Chest pain and shortness of breath. EXAM: CHEST - 2 VIEW COMPARISON:  12/23/2017 FINDINGS: Cardiac silhouette is markedly enlarged consistent with cardiomegaly and/or pericardial fluid. There is pulmonary venous hypertension with interstitial edema. No effusions. No focal pulmonary finding. No acute bone finding. IMPRESSION: Enlarged cardiac silhouette consistent with cardiomegaly and/or pericardial effusion. Pulmonary venous hypertension with interstitial pulmonary edema. Electronically Signed   By: Nelson Chimes M.D.   On: 04/07/2018 16:10    Procedures Procedures (including critical care time)  Medications Ordered in ED Medications - No data to display   Initial Impression / Assessment and Plan / ED Course  Triage vital signs and the nursing notes have been reviewed.  Pertinent labs & imaging results that were available during care of the patient were reviewed and considered in medical decision making (see chart for details).  Patient is well appearing and does not appear in acute distress. EKG and physical exam show is be back in a-fib rhythm. Concerned for an underlying acute cardiac process that is causing this so  soon after cardioversion.  Clinical Course as of Apr 07 1649  Thu Apr 07, 2018  1448 EKG showed patient's rhythm as a-fib. Negative troponin.   [GM]  8366 CXR shows cardiomegaly and pulmonary edema which is different from pt's last CXR in 11/2017. Case was discussed with cardiology who will admit the patient.   [GM]    Clinical Course User Index [GM] Mortis, Jonelle Sports, PA-C    Final Clinical Impressions(s) / ED Diagnoses  1. Shortness of Breath and Chest Pain in the context of recent cardioversion. Attempted to discuss case with pt's cardiologist, but provider was out of the office. Discussed with cardiology who will admit.  Dispo: Plan to admit. Case discussed with cardiology who will admit patient. Signed out to [employer], PA-C at shift change.  Final diagnoses:  Shortness of breath    ED Discharge Orders    None        Junita Push 04/07/18 1650    Valarie Merino, MD 04/08/18 [phone]

## 2018-04-07 NOTE — Telephone Encounter (Signed)
-----   Message from Cristopher Estimable, RN sent at 04/06/2018  4:51 PM EDT ----- Please arrange evaluation in atrial fibrillation clinic and contact pt with appt.  Thanks  [name]

## 2018-04-08 ENCOUNTER — Other Ambulatory Visit: Payer: Self-pay

## 2018-04-08 ENCOUNTER — Inpatient Hospital Stay (HOSPITAL_COMMUNITY): Payer: Medicaid Other

## 2018-04-08 ENCOUNTER — Telehealth: Payer: Self-pay | Admitting: Nurse Practitioner

## 2018-04-08 DIAGNOSIS — I361 Nonrheumatic tricuspid (valve) insufficiency: Secondary | ICD-10-CM

## 2018-04-08 DIAGNOSIS — I272 Pulmonary hypertension, unspecified: Secondary | ICD-10-CM

## 2018-04-08 DIAGNOSIS — I340 Nonrheumatic mitral (valve) insufficiency: Secondary | ICD-10-CM

## 2018-04-08 LAB — BASIC METABOLIC PANEL
Anion gap: 10 (ref 5–15)
BUN: 8 mg/dL (ref 6–20)
CALCIUM: 9.2 mg/dL (ref 8.9–10.3)
CHLORIDE: 104 mmol/L (ref 98–111)
CO2: 28 mmol/L (ref 22–32)
CREATININE: 0.66 mg/dL (ref 0.44–1.00)
GFR calc non Af Amer: >60 mL/min (ref >60)
Glucose, Bld: 179 mg/dL — ABNORMAL HIGH (ref 70–99)
Potassium: 3.6 mmol/L (ref 3.5–5.1)
SODIUM: 142 mmol/L (ref 135–145)

## 2018-04-08 LAB — ECHOCARDIOGRAM COMPLETE
HEIGHTINCHES: 67 in
Weight: 5572.8 oz

## 2018-04-08 LAB — CBC
HEMATOCRIT: 39.1 % (ref 36.0–46.0)
Hemoglobin: 11.8 g/dL — ABNORMAL LOW (ref 12.0–15.0)
MCH: 26.6 pg (ref 26.0–34.0)
MCHC: 30.2 g/dL (ref 30.0–36.0)
MCV: 88.1 fL (ref 78.0–100.0)
PLATELETS: 221 10*3/uL (ref 150–400)
RBC: 4.44 MIL/uL (ref 3.87–5.11)
RDW: 14.2 % (ref 11.5–15.5)
WBC: 5.2 10*3/uL (ref 4.0–10.5)

## 2018-04-08 LAB — HEPATIC FUNCTION PANEL
ALBUMIN: 3.5 g/dL (ref 3.5–5.0)
ALK PHOS: 92 U/L (ref 38–126)
ALT: 14 U/L (ref 0–44)
AST: 23 U/L (ref 15–41)
BILIRUBIN INDIRECT: 0.5 mg/dL (ref 0.3–0.9)
Bilirubin, Direct: 0.2 mg/dL (ref 0.0–0.2)
TOTAL PROTEIN: 7.3 g/dL (ref 6.5–8.1)
Total Bilirubin: 0.7 mg/dL (ref 0.3–1.2)

## 2018-04-08 LAB — TROPONIN I: Troponin I: <0.03 ng/mL (ref <0.03)

## 2018-04-08 LAB — GLUCOSE, CAPILLARY
Glucose-Capillary: 156 mg/dL — ABNORMAL HIGH (ref 70–99)
Glucose-Capillary: 195 mg/dL — ABNORMAL HIGH (ref 70–99)
Glucose-Capillary: 182 mg/dL — ABNORMAL HIGH (ref 70–99)

## 2018-04-08 MED ORDER — POTASSIUM CHLORIDE 20 MEQ/15ML (10%) PO SOLN
15.0000 meq | Freq: Every day | ORAL | Status: DC
  Administered 2018-04-08 – 2018-04-15 (×9): 15 meq via ORAL
  Filled 2018-04-08 (×9): qty 15

## 2018-04-08 MED ORDER — POTASSIUM CHLORIDE ER 10 MEQ PO TBCR
15.0000 meq | EXTENDED_RELEASE_TABLET | Freq: Once | ORAL | Status: AC
  Administered 2018-04-08: 15 meq via ORAL
  Filled 2018-04-08: qty 2

## 2018-04-08 MED ORDER — FUROSEMIDE 10 MG/ML IJ SOLN
40.0000 mg | Freq: Once | INTRAMUSCULAR | Status: AC
  Administered 2018-04-08: 40 mg via INTRAVENOUS

## 2018-04-08 NOTE — Progress Notes (Signed)
Pt. Refused cpap. 

## 2018-04-08 NOTE — Progress Notes (Signed)
  Echocardiogram 2D Echocardiogram has been performed.  [name] L Androw 04/08/2018, 9:59 AM

## 2018-04-08 NOTE — Progress Notes (Addendum)
Brief Nutrition Education Note   RD consulted for nutrition education regarding diabetes and CHF.   Lab Results  Component Value Date   HGBA1C 9.3 (A) 03/21/2018   PTA DM medications are 10 mg glipizide daily and 1000 mg metformin BID.   CBGS: 156-195 (inpatient orders for glycemic control are 1000 mg metformin BID, 10 mg glipizide daily, 0-15 units insulin aspart TID, and 0-5 units insulin aspart q HS).   Case discussed with RN, who requested RD see this patient. Per chart notes, pt and pt mother do the cooking for their household; RN suspects noncompliance. RN also reports concern with obesity and shares that pt told her that she does not like to weigh herself.   Pt has been educated by RD multiple times in the past. RD previously referred pt to outpatient RD for weight and diabetes management, but no records of pt attending appointments.   Pt was sleeping soundly and time of visit and RD unable to wake. No family present at time of visit. Provided "Heart Healthy/ Consistent Carbohydrate Nutrition Therapy" handout from [facility] Nutrition Care Manual along with RD contact information. RD will also refer to [facility] for additional support.   Body mass index is 54.55 kg/m. Patient meets criteria for extreme obesity, class III based on current BMI.   Current diet order is Carb Modified, patient is consuming approximately 100% of meals at this time. Labs and medications reviewed.   No nutrition interventions warranted at this time. If nutrition issues arise, please consult RD.   Teal Raben A. Jimmye Norman, RD, LDN, CDE Pager: [phone] After hours Pager: [phone]

## 2018-04-08 NOTE — Progress Notes (Signed)
Patient states she had yeast in her mouth and it hurts sometimes. RN could not see yeast, although there could be something occurring.

## 2018-04-08 NOTE — Care Management Note (Signed)
Case Management Note  Patient Details  Name: [name] MRN: [id number] Date of Birth: [birth year]  Subjective/Objective:     CHF              Action/Plan: Patient lives at home; goes to the [facility] for medical care; she use their pharmacy for medication and has been approved for the [employer] for ongoing assistance. Patient stated that she had a CPAP machine 5 yrs ago but did not like the mask. Message sent to [facility] to make her an apt with the [facility] for CPAP; ( the [facility] will only accept apt from the PCP or Pulmonologist). CM will continue to follow for progression of care.  Expected Discharge Date:    possibly 04/12/2018              Expected Discharge Plan:  Home/Self Care  In-House Referral:   Financial Counselor  Discharge planning Services  CM Consult  Status of Service:  In process, will continue to follow  Sherrilyn Rist [phone] 04/08/2018, 12:08 PM

## 2018-04-08 NOTE — Progress Notes (Signed)
Patient assessed by RT, patient lethargic- patient says she was just sleeping. Patient endorses orthopnea and is tachpneic with minimal exertion/conversation. RN assessed O2 sats and lung sounds. Paged cardiology for further orders.  RN helped patient raise HOB and prop up on pillows. Received extra dose of lasix and potassium.

## 2018-04-08 NOTE — Progress Notes (Signed)
RT called to place pt on CPAP pt refused. Pt states she does not wear hers at home d/t a phobia related to the mask. States mask terrifies her. Upon RT assessment pt has increased WOB, tachypnea, SOB. Put pt in high fowlers, improved WOB, and decreased tachypnea and SOB. Pt does get SOB and have increased WOB when talking or reclining. Pt states she sleeps sitting up at home and breathes just fine. RT will continue to monitor.

## 2018-04-08 NOTE — Telephone Encounter (Signed)
Megan the Case Manager from [facility] called stating that patient needs a sleep study for a CPAP but she is not able to make the referral because it needs to come from patient PCP. Please f/u

## 2018-04-08 NOTE — Progress Notes (Signed)
Progress Note  Patient Name: [name] Date of Encounter: 04/08/2018  Primary Cardiologist: [name], MD   Subjective   52 year old female with a history of chronic diastolic congestive heart failure, persistent atrial fibrillation, hypertension, morbid obesity who was admitted for worsening shortness of breath and atrial fibrillation.  She has chronic diastolic congestive heart failure as well as moderate pulmonary hypertension.  She has known obstructive sleep apnea but refuses to wear this CPAP mask.  There is also likely a component of obesity hypoventilation.  She diuresed only 220 cc overnight.  Inpatient Medications    Scheduled Meds: . amLODipine  10 mg Oral Daily  . carvedilol  25 mg Oral BID  . ferrous sulfate  325 mg Oral BID  . furosemide  40 mg Intravenous BID  . glipiZIDE  10 mg Oral Q breakfast  . insulin aspart  0-15 Units Subcutaneous TID WC  . insulin aspart  0-5 Units Subcutaneous QHS  . losartan  100 mg Oral Daily  . metFORMIN  1,000 mg Oral BID WC  . oxybutynin  15 mg Oral QHS  . potassium chloride SA  15 mEq Oral BID  . risperiDONE  4 mg Oral QHS  . rivaroxaban  20 mg Oral Q supper  . sodium chloride flush  3 mL Intravenous Q12H   Continuous Infusions: . sodium chloride     PRN Meds: sodium chloride, acetaminophen, albuterol, ALPRAZolam, nitroGLYCERIN, ondansetron (ZOFRAN) IV, sodium chloride flush, zolpidem   Vital Signs    Vitals:   04/08/18 0018 04/08/18 0100 04/08/18 0324 04/08/18 0533  BP: (!) 142/72  133/87   Pulse: 96  90   Resp: 20 (!) 36 20   Temp: 98.1 F (36.7 C)  98.3 F (36.8 C)   TempSrc: Oral  Oral   SpO2: 100% 96% 100%   Weight:    (!) 348 lb 4.8 oz (158 kg)  Height:        Intake/Output Summary (Last 24 hours) at 04/08/2018 0717 Last data filed at 04/08/2018 0248 Gross per 24 hour  Intake 680 ml  Output 900 ml  Net -220 ml   Filed Weights   04/07/18 1341 04/07/18 1957 04/08/18 0533  Weight: (!) 346 lb  (156.9 kg) (!) 348 lb 9.6 oz (158.1 kg) (!) 348 lb 4.8 oz (158 kg)    Telemetry    Atrial fib  - Personally Reviewed  ECG     atrial fib  - Personally Reviewed  Physical Exam   GEN:  Morbidly obese, middle-aged female, no acute distress. Neck: No JVD Cardiac:  Irregularly irregular Respiratory: Clear to auscultation bilaterally. GI: morbidly obese.  MS: trace - 1+ edema . Neuro:  Nonfocal  Psych: Normal affect   Labs    Chemistry Recent Labs  Lab 04/07/18 1345  NA 139  K 3.8  CL 103  CO2 27  GLUCOSE 223*  BUN 7  CREATININE 0.65  CALCIUM 9.9  GFRNONAA >60  GFRAA >60  ANIONGAP 9     Hematology Recent Labs  Lab 04/07/18 1345  WBC 5.2  RBC 4.36  HGB 11.6*  HCT 38.2  MCV 87.6  MCH 26.6  MCHC 30.4  RDW 14.1  PLT 205    Cardiac Enzymes Recent Labs  Lab 04/07/18 2011 04/08/18 0128  TROPONINI <0.03 <0.03    Recent Labs  Lab 04/07/18 1401  TROPIPOC 0.00     BNPNo results for input(s): BNP, PROBNP in the last 168 hours.   DDimer No results  for input(s): DDIMER in the last 168 hours.   Radiology    Dg Chest 2 View  Result Date: 04/07/2018 CLINICAL DATA:  Recent cardioversion for atrial fibrillation. Chest pain and shortness of breath. EXAM: CHEST - 2 VIEW COMPARISON:  12/23/2017 FINDINGS: Cardiac silhouette is markedly enlarged consistent with cardiomegaly and/or pericardial fluid. There is pulmonary venous hypertension with interstitial edema. No effusions. No focal pulmonary finding. No acute bone finding. IMPRESSION: Enlarged cardiac silhouette consistent with cardiomegaly and/or pericardial effusion. Pulmonary venous hypertension with interstitial pulmonary edema. Electronically Signed   By: Nelson Chimes M.D.   On: 04/07/2018 16:10    Cardiac Studies     Patient Profile     52 y.o. female with morbid obesity, persistent atrial fibrillation, chronic diastolic congestive heart failure, needed with worsening shortness of breath and volume  overload.  Assessment & Plan    1.  Worsening dyspnea: This is likely a combination of acute on chronic diastolic congestive heart failure and pulmonary hypertension.  Diuresis 0.2 L.  We will continue with aggressive diuresis.  2.  Pulmonary hypertension: She has known obstructive sleep apnea but refuses to wear the CPAP mask.  There may be a component of obesity hypoventilation.  We will get pulmonary function test.  3.  Persistent atrial fibrillation.  She has had 3 cardioversions but has not remained in sinus rhythm.  She has been referred to the atrial fibrillation clinic but is not made it to that appointment yet.  4.  DM:  Plan per primary   5.  Morbid obesity:     For questions or updates, please contact [place] Please consult [email or web address] for contact info under Cardiology/STEMI.      Signed, Mertie Moores, MD  04/08/2018, 7:17 AM

## 2018-04-09 LAB — BASIC METABOLIC PANEL
Anion gap: 10 (ref 5–15)
BUN: 12 mg/dL (ref 6–20)
CHLORIDE: 99 mmol/L (ref 98–111)
CO2: 28 mmol/L (ref 22–32)
CREATININE: 0.77 mg/dL (ref 0.44–1.00)
Calcium: 8.8 mg/dL — ABNORMAL LOW (ref 8.9–10.3)
GFR calc Af Amer: >60 mL/min (ref >60)
GFR calc non Af Amer: >60 mL/min (ref >60)
GLUCOSE: 183 mg/dL — AB (ref 70–99)
POTASSIUM: 3.5 mmol/L (ref 3.5–5.1)
Sodium: 137 mmol/L (ref 135–145)

## 2018-04-09 LAB — GLUCOSE, CAPILLARY
GLUCOSE-CAPILLARY: 153 mg/dL — AB (ref 70–99)
Glucose-Capillary: 161 mg/dL — ABNORMAL HIGH (ref 70–99)
Glucose-Capillary: 189 mg/dL — ABNORMAL HIGH (ref 70–99)
Glucose-Capillary: 180 mg/dL — ABNORMAL HIGH (ref 70–99)

## 2018-04-09 NOTE — Progress Notes (Addendum)
RN assisted pt to bathroom. RN completed full linen change on pt's bed. Bed alarm is on.

## 2018-04-09 NOTE — Progress Notes (Signed)
Progress Note  Patient Name: [name] Date of Encounter: 04/09/2018  Primary Cardiologist: [name], MD   Subjective   "I am breathing better, why is there blood coming from my mouth?"  Inpatient Medications    Scheduled Meds: . amLODipine  10 mg Oral Daily  . carvedilol  25 mg Oral BID  . ferrous sulfate  325 mg Oral BID  . furosemide  40 mg Intravenous BID  . glipiZIDE  10 mg Oral Q breakfast  . insulin aspart  0-15 Units Subcutaneous TID WC  . insulin aspart  0-5 Units Subcutaneous QHS  . losartan  100 mg Oral Daily  . metFORMIN  1,000 mg Oral BID WC  . oxybutynin  15 mg Oral QHS  . potassium chloride  15 mEq Oral Daily  . risperiDONE  4 mg Oral QHS  . rivaroxaban  20 mg Oral Q supper  . sodium chloride flush  3 mL Intravenous Q12H   Continuous Infusions: . sodium chloride     PRN Meds: sodium chloride, acetaminophen, albuterol, ALPRAZolam, nitroGLYCERIN, ondansetron (ZOFRAN) IV, sodium chloride flush, zolpidem   Vital Signs    Vitals:   04/09/18 0646 04/09/18 0656 04/09/18 0728 04/09/18 0807  BP:  (!) 150/91 (!) 128/109 (!) 132/98  Pulse:  (!) 107 (!) 108 (!) 104  Resp:  18 20   Temp:  97.6 F (36.4 C) 97.7 F (36.5 C)   TempSrc:  Oral Oral   SpO2:  95% 93%   Weight: (!) 346 lb 11.2 oz (157.3 kg)     Height:        Intake/Output Summary (Last 24 hours) at 04/09/2018 1138 Last data filed at 04/09/2018 1058 Gross per 24 hour  Intake 844 ml  Output 1950 ml  Net -1106 ml   Filed Weights   04/07/18 1957 04/08/18 0533 04/09/18 0646  Weight: (!) 348 lb 9.6 oz (158.1 kg) (!) 348 lb 4.8 oz (158 kg) (!) 346 lb 11.2 oz (157.3 kg)    Telemetry    Atrial fib at 100/min - Personally Reviewed  ECG    none - Personally Reviewed  Physical Exam   GEN: morbidly obese, no acute distress.   Neck: unable to assess JVD Cardiac: RRR, no murmurs, rubs, or gallops.  Respiratory: Clear to auscultation bilaterally. GI: Soft, nontender, non-distended    MS: 2+ edema; No deformity. Neuro:  Nonfocal  Psych: Normal affect   Labs    Chemistry Recent Labs  Lab 04/07/18 1345 04/08/18 0754 04/09/18 0532  NA 139 142 137  K 3.8 3.6 3.5  CL 103 104 99  CO2 27 28 28   GLUCOSE 223* 179* 183*  BUN 7 8 12   CREATININE 0.65 0.66 0.77  CALCIUM 9.9 9.2 8.8*  PROT  --  7.3  --   ALBUMIN  --  3.5  --   AST  --  23  --   ALT  --  14  --   ALKPHOS  --  92  --   BILITOT  --  0.7  --   GFRNONAA >60 >60 >60  GFRAA >60 >60 >60  ANIONGAP 9 10 10      Hematology Recent Labs  Lab 04/07/18 1345 04/08/18 0754  WBC 5.2 5.2  RBC 4.36 4.44  HGB 11.6* 11.8*  HCT 38.2 39.1  MCV 87.6 88.1  MCH 26.6 26.6  MCHC 30.4 30.2  RDW 14.1 14.2  PLT 205 221    Cardiac Enzymes Recent Labs  Lab 04/07/18  2011 04/08/18 0128 04/08/18 0754  TROPONINI <0.03 <0.03 <0.03    Recent Labs  Lab 04/07/18 1401  TROPIPOC 0.00     BNPNo results for input(s): BNP, PROBNP in the last 168 hours.   DDimer No results for input(s): DDIMER in the last 168 hours.   Radiology    Dg Chest 2 View  Result Date: 04/07/2018 CLINICAL DATA:  Recent cardioversion for atrial fibrillation. Chest pain and shortness of breath. EXAM: CHEST - 2 VIEW COMPARISON:  12/23/2017 FINDINGS: Cardiac silhouette is markedly enlarged consistent with cardiomegaly and/or pericardial fluid. There is pulmonary venous hypertension with interstitial edema. No effusions. No focal pulmonary finding. No acute bone finding. IMPRESSION: Enlarged cardiac silhouette consistent with cardiomegaly and/or pericardial effusion. Pulmonary venous hypertension with interstitial pulmonary edema. Electronically Signed   By: Nelson Chimes M.D.   On: 04/07/2018 16:10    Cardiac Studies   none  Patient Profile     52 y.o. female admitted with acute on chronic diastolic heart failure  Assessment & Plan    1. Acute on chronic diastolic heart failure - weight unchanged but she is finally starting to urinate with  IV lasix. Continue. 2. Atrial fib - her rates are reasonably well controlled. Continue her current meds. 3. Mouth bleeding - her nurse was reported that the patient was biting her cheek. Patient did not mention this. Will follow.  Kweli Grassel,M.D.  For questions or updates, please contact [place] Please consult [email or web address] for contact info under Cardiology/STEMI.

## 2018-04-09 NOTE — Progress Notes (Signed)
Patient woke up with blood in mouth and gown. RN assessed patient's mouth no area of bleeding seen but patient has what appears to be bite marks in her upper cheek of her mouth. RN helped patient rinse mouth with mouthwash. RN will pass on in report to dayshift RN to continue to monitor and further assess. No other complaints at this time. Will continue to monitor.

## 2018-04-09 NOTE — Progress Notes (Signed)
RN rounded on pt. Pt is asleep. Bed alarm is on.

## 2018-04-10 DIAGNOSIS — I4819 Other persistent atrial fibrillation: Secondary | ICD-10-CM

## 2018-04-10 DIAGNOSIS — G4733 Obstructive sleep apnea (adult) (pediatric): Secondary | ICD-10-CM

## 2018-04-10 DIAGNOSIS — I4891 Unspecified atrial fibrillation: Secondary | ICD-10-CM

## 2018-04-10 LAB — BASIC METABOLIC PANEL
ANION GAP: 8 (ref 5–15)
BUN: 14 mg/dL (ref 6–20)
CO2: 29 mmol/L (ref 22–32)
Calcium: 9 mg/dL (ref 8.9–10.3)
Chloride: 103 mmol/L (ref 98–111)
Creatinine, Ser: 0.69 mg/dL (ref 0.44–1.00)
GFR calc Af Amer: >60 mL/min (ref >60)
GFR calc non Af Amer: >60 mL/min (ref >60)
GLUCOSE: 181 mg/dL — AB (ref 70–99)
POTASSIUM: 3.8 mmol/L (ref 3.5–5.1)
Sodium: 140 mmol/L (ref 135–145)

## 2018-04-10 LAB — GLUCOSE, CAPILLARY
GLUCOSE-CAPILLARY: 185 mg/dL — AB (ref 70–99)
GLUCOSE-CAPILLARY: 143 mg/dL — AB (ref 70–99)
Glucose-Capillary: 189 mg/dL — ABNORMAL HIGH (ref 70–99)
Glucose-Capillary: 202 mg/dL — ABNORMAL HIGH (ref 70–99)

## 2018-04-10 MED ORDER — FUROSEMIDE 10 MG/ML IJ SOLN
80.0000 mg | Freq: Two times a day (BID) | INTRAMUSCULAR | Status: DC
  Administered 2018-04-10 – 2018-04-15 (×10): 80 mg via INTRAVENOUS
  Filled 2018-04-10 (×9): qty 8

## 2018-04-10 NOTE — Progress Notes (Signed)
Pt requesting something to drink, reminded about fluid restriction. Pt verbalized understanding.

## 2018-04-10 NOTE — Progress Notes (Signed)
Pt refused cpap for the night. Rt will continue to monitor as needed.

## 2018-04-10 NOTE — Progress Notes (Addendum)
Spoke to pt about fluid restriction, and plan of care. Pt getting water from the sink. Recommended pt. Ask family bring sugar free candies for dry mouth.

## 2018-04-10 NOTE — Progress Notes (Signed)
Progress Note  Patient Name: [name] Date of Encounter: 04/10/2018  Primary Cardiologist: [name], MD   Subjective   52 year old female with a history of chronic diastolic congestive heart failure, persistent atrial fibrillation, hypertension, morbid obesity who was admitted for worsening shortness of breath and atrial fibrillation.  She has chronic diastolic congestive heart failure as well as moderate pulmonary hypertension.  She has known obstructive sleep apnea but refuses to wear this CPAP mask.  There is also likely a component of obesity hypoventilation.  She is diuresing now.  She is put out 3 L so far this admission.  She apparently bit her inside cheek and had some bleeding in her mouth.  She presented with blood in her mouth which is probably related to the same thing.  Inpatient Medications    Scheduled Meds: . amLODipine  10 mg Oral Daily  . carvedilol  25 mg Oral BID  . ferrous sulfate  325 mg Oral BID  . furosemide  80 mg Intravenous BID  . glipiZIDE  10 mg Oral Q breakfast  . insulin aspart  0-15 Units Subcutaneous TID WC  . insulin aspart  0-5 Units Subcutaneous QHS  . losartan  100 mg Oral Daily  . metFORMIN  1,000 mg Oral BID WC  . oxybutynin  15 mg Oral QHS  . potassium chloride  15 mEq Oral Daily  . risperiDONE  4 mg Oral QHS  . rivaroxaban  20 mg Oral Q supper  . sodium chloride flush  3 mL Intravenous Q12H   Continuous Infusions: . sodium chloride     PRN Meds: sodium chloride, acetaminophen, albuterol, ALPRAZolam, nitroGLYCERIN, ondansetron (ZOFRAN) IV, sodium chloride flush, zolpidem   Vital Signs    Vitals:   04/09/18 1141 04/09/18 2204 04/10/18 0649 04/10/18 1149  BP: 122/85 (!) 141/66 (!) 143/87 119/73  Pulse: 99 91 (!) 102 93  Resp: 20 20 20 20   Temp: 98.2 F (36.8 C) 98 F (36.7 C) 97.6 F (36.4 C) 98.2 F (36.8 C)  TempSrc: Oral Oral Oral Oral  SpO2: 95% 96% 100% 94%  Weight:   (!) 347 lb 4.8 oz (157.5 kg)   Height:         Intake/Output Summary (Last 24 hours) at 04/10/2018 1202 Last data filed at 04/10/2018 0900 Gross per 24 hour  Intake 1438 ml  Output 2650 ml  Net -1212 ml   Filed Weights   04/08/18 0533 04/09/18 0646 04/10/18 0649  Weight: (!) 348 lb 4.8 oz (158 kg) (!) 346 lb 11.2 oz (157.3 kg) (!) 347 lb 4.8 oz (157.5 kg)    Telemetry    Atrial fib  - Personally Reviewed  ECG     atrial fib  - Personally Reviewed  Physical Exam   Physical Exam: Blood pressure 119/73, pulse 93, temperature 98.2 F (36.8 C), temperature source Oral, resp. rate 20, height 5\' 7"  (1.702 m), weight (!) 347 lb 4.8 oz (157.5 kg), SpO2 94 %.  GEN:   Morbidly obese female,  HEENT: Normal NECK: No JVD; No carotid bruits LYMPHATICS: No lymphadenopathy CARDIAC:  Irreg. Irreg.  RESPIRATORY:  Clear to auscultation without rales, wheezing or rhonchi  ABDOMEN: Soft, non-tender, non-distended MUSCULOSKELETAL:  No edema; No deformity  SKIN: Warm and dry NEUROLOGIC:  Alert and oriented x 3   Labs    Chemistry Recent Labs  Lab 04/08/18 0754 04/09/18 0532 04/10/18 0542  NA 142 137 140  K 3.6 3.5 3.8  CL 104 99 103  CO2 28 28 29   GLUCOSE 179* 183* 181*  BUN 8 12 14   CREATININE 0.66 0.77 0.69  CALCIUM 9.2 8.8* 9.0  PROT 7.3  --   --   ALBUMIN 3.5  --   --   AST 23  --   --   ALT 14  --   --   ALKPHOS 92  --   --   BILITOT 0.7  --   --   GFRNONAA >60 >60 >60  GFRAA >60 >60 >60  ANIONGAP 10 10 8      Hematology Recent Labs  Lab 04/07/18 1345 04/08/18 0754  WBC 5.2 5.2  RBC 4.36 4.44  HGB 11.6* 11.8*  HCT 38.2 39.1  MCV 87.6 88.1  MCH 26.6 26.6  MCHC 30.4 30.2  RDW 14.1 14.2  PLT 205 221    Cardiac Enzymes Recent Labs  Lab 04/07/18 2011 04/08/18 0128 04/08/18 0754  TROPONINI <0.03 <0.03 <0.03    Recent Labs  Lab 04/07/18 1401  TROPIPOC 0.00     BNPNo results for input(s): BNP, PROBNP in the last 168 hours.   DDimer No results for input(s): DDIMER in the last 168 hours.     Radiology    No results found.  Cardiac Studies     Patient Profile     52 y.o. female with morbid obesity, persistent atrial fibrillation, chronic diastolic congestive heart failure, needed with worsening shortness of breath and volume overload.  Assessment & Plan    1.  Worsening dyspnea:  breathign is better.  She is diuresed 3 L.  2.  Pulmonary hypertension: As documented pulmonary hypertension.  This is likely due to obstructive sleep apnea and possibly obesity hypoventilation..  3.  Persistent atrial fibrillation.  She is been referred to A. fib clinic.  4.  DM:  Plan per primary   5.  Morbid obesity:   Advised nutritioinal consult.   Possible DC to home in the next day or so  Continue iv lasix   For questions or updates, please contact [place] Please consult [email or web address] for contact info under Cardiology/STEMI.      Signed, Mertie Moores, MD  04/10/2018, 12:02 PM

## 2018-04-10 NOTE — Progress Notes (Signed)
Progress Note  Patient Name: [name] Date of Encounter: 04/10/2018  Primary Cardiologist: [name], MD   Subjective   Still some dyspnea. Hard to know what her baseline is. Hard to assess volume status.  Inpatient Medications    Scheduled Meds: . amLODipine  10 mg Oral Daily  . carvedilol  25 mg Oral BID  . ferrous sulfate  325 mg Oral BID  . furosemide  40 mg Intravenous BID  . glipiZIDE  10 mg Oral Q breakfast  . insulin aspart  0-15 Units Subcutaneous TID WC  . insulin aspart  0-5 Units Subcutaneous QHS  . losartan  100 mg Oral Daily  . metFORMIN  1,000 mg Oral BID WC  . oxybutynin  15 mg Oral QHS  . potassium chloride  15 mEq Oral Daily  . risperiDONE  4 mg Oral QHS  . rivaroxaban  20 mg Oral Q supper  . sodium chloride flush  3 mL Intravenous Q12H   Continuous Infusions: . sodium chloride     PRN Meds: sodium chloride, acetaminophen, albuterol, ALPRAZolam, nitroGLYCERIN, ondansetron (ZOFRAN) IV, sodium chloride flush, zolpidem   Vital Signs    Vitals:   04/09/18 1141 04/09/18 2204 04/10/18 0649 04/10/18 1149  BP: 122/85 (!) 141/66 (!) 143/87 119/73  Pulse: 99 91 (!) 102 93  Resp: 20 20 20 20   Temp: 98.2 F (36.8 C) 98 F (36.7 C) 97.6 F (36.4 C) 98.2 F (36.8 C)  TempSrc: Oral Oral Oral Oral  SpO2: 95% 96% 100% 94%  Weight:   (!) 347 lb 4.8 oz (157.5 kg)   Height:        Intake/Output Summary (Last 24 hours) at 04/10/2018 1157 Last data filed at 04/10/2018 0900 Gross per 24 hour  Intake 1438 ml  Output 2650 ml  Net -1212 ml   Filed Weights   04/08/18 0533 04/09/18 0646 04/10/18 0649  Weight: (!) 348 lb 4.8 oz (158 kg) (!) 346 lb 11.2 oz (157.3 kg) (!) 347 lb 4.8 oz (157.5 kg)    Telemetry    Atrial fib with a controlled VR - Personally Reviewed  ECG    none - Personally Reviewed  Physical Exam   GEN: massively obese, no acute distress.   Neck: 7 cm JVD Cardiac: IRIRR, no murmurs, rubs, or gallops.  Respiratory: Clear to  auscultation bilaterally. GI: Soft, nontender, non-distended  MS: No edema; No deformity. Neuro:  Nonfocal  Psych: Normal affect   Labs    Chemistry Recent Labs  Lab 04/08/18 0754 04/09/18 0532 04/10/18 0542  NA 142 137 140  K 3.6 3.5 3.8  CL 104 99 103  CO2 28 28 29   GLUCOSE 179* 183* 181*  BUN 8 12 14   CREATININE 0.66 0.77 0.69  CALCIUM 9.2 8.8* 9.0  PROT 7.3  --   --   ALBUMIN 3.5  --   --   AST 23  --   --   ALT 14  --   --   ALKPHOS 92  --   --   BILITOT 0.7  --   --   GFRNONAA >60 >60 >60  GFRAA >60 >60 >60  ANIONGAP 10 10 8      Hematology Recent Labs  Lab 04/07/18 1345 04/08/18 0754  WBC 5.2 5.2  RBC 4.36 4.44  HGB 11.6* 11.8*  HCT 38.2 39.1  MCV 87.6 88.1  MCH 26.6 26.6  MCHC 30.4 30.2  RDW 14.1 14.2  PLT 205 221    Cardiac  Enzymes Recent Labs  Lab 04/07/18 2011 04/08/18 0128 04/08/18 0754  TROPONINI <0.03 <0.03 <0.03    Recent Labs  Lab 04/07/18 1401  TROPIPOC 0.00     BNPNo results for input(s): BNP, PROBNP in the last 168 hours.   DDimer No results for input(s): DDIMER in the last 168 hours.   Radiology    No results found.  Cardiac Studies   none  Patient Profile     52 y.o. female admitted with acute on chronic diastolic heart failure  Assessment & Plan    1. Acute on chronic diastolic heart failure - weight unchanged. Nurse states she is drinking a lot of water. We will uptitrate her diuretic and fluid restrict.  2. masssive obesity - she will need to make a concerted effort to lose weight. 3. Atrial fib - her rates at least while at rest are reasonably well controlled. We will follow.  For questions or updates, please contact [place] Please consult [email or web address] for contact info under Cardiology/STEMI.      Signed, Cristopher Peru, MD  04/10/2018, 11:57 AM  Patient ID: [name], female   DOB: [birth year], 52 y.o.   MRN: [id number]

## 2018-04-10 NOTE — Progress Notes (Signed)
Mid shift hand-off report given at beside patient asleep with no distress.

## 2018-04-10 NOTE — Progress Notes (Signed)
Pt sitting up edge of bed. Requested more fluid, educated pt on the importance of fluid restrictions.

## 2018-04-11 LAB — GLUCOSE, CAPILLARY
Glucose-Capillary: 229 mg/dL — ABNORMAL HIGH (ref 70–99)
Glucose-Capillary: 113 mg/dL — ABNORMAL HIGH (ref 70–99)
Glucose-Capillary: 161 mg/dL — ABNORMAL HIGH (ref 70–99)
Glucose-Capillary: 143 mg/dL — ABNORMAL HIGH (ref 70–99)
Glucose-Capillary: 93 mg/dL (ref 70–99)

## 2018-04-11 LAB — BASIC METABOLIC PANEL
ANION GAP: 8 (ref 5–15)
BUN: 15 mg/dL (ref 6–20)
CHLORIDE: 103 mmol/L (ref 98–111)
CO2: 30 mmol/L (ref 22–32)
Calcium: 9 mg/dL (ref 8.9–10.3)
Creatinine, Ser: 0.7 mg/dL (ref 0.44–1.00)
Glucose, Bld: 118 mg/dL — ABNORMAL HIGH (ref 70–99)
Potassium: 3.6 mmol/L (ref 3.5–5.1)
SODIUM: 141 mmol/L (ref 135–145)

## 2018-04-11 NOTE — Progress Notes (Signed)
Pt asked me for a bowl of soup, I told her she can not have that because she will go over her fluid restriction of the day, she then asked for a sandwich which I was not able to give her because it will put her over her daily limit of carbs, instead I offered some protein ( chicken) which she agreed to. After eating that she wanted some ice cream, so I did some teaching about how ice cream is liquid and it will contribute in going over her fluid restriction of the day. Patient requested Cpap and I was able to call respiratory for them to set it up for her in order to be able to sleep.

## 2018-04-11 NOTE — Progress Notes (Signed)
RT called to pt room requesting CPAP for the night. Pt has been refusing d/t phobia of mask. Pt states she wants to try it tonight. Set up CPAP auto titrate with 1 L of O2 blended.. Pt not sure of her home setting. Will continue to monitor.

## 2018-04-11 NOTE — Progress Notes (Signed)
Nutrition Brief Note  RD consulted for nutrition education. Noted that Harless Litten, RD, LDN, CDE, provided nutrition education regarding diabetes and CHF on 04/08/18.  Spoke with RN regarding re-consult for diet education. RN reports pt is having difficulty with fluid restriction and is continually asking for additional fluids like coffee, tea, and juice. Per cardiology MD note this morning, consult related to pt getting extra fluid. Noted pt has been getting water from the sink due to being thirsty.  RD provided "Fluid Restriction Nutrition Therapy" handout from the [employer]. RD emphasized 1200 mL (1.2 L / 40 oz / 5 cup) fluid restriction with pt and the importance of this restriction. Pt verbalized understanding of need for fluid restriction. Pt also verbalized understanding of low sodium diet. Discussed ways to alleviate pt's thirst including sucking on sugar-free hard candies, rinsing mouth with mouthwash, etc. Also discussed what foods are included in the "fluid" category.  Pt expressed concern and frustration regarding what she is able to order for meals. Pt feels like she was able to order some foods yesterday that she cannot order today. Noted pt on Renal/Carb Modified diet with 1200 mL fluid restriction and 2 gram sodium restriction. RD called cardiologist regarding renal restriction on pt's diet as pt does not have a history of renal disease.  MD approved eliminating renal diet restriction. RD changed diet to Carb Modified / 2 gram sodium with 1200 mL fluid restriction.  RD discussed why it is important for patient to adhere to diet recommendations, and emphasized the role of fluids, foods to avoid, and importance of weighing self daily. Teach back method used.  Expect fair compliance.  Body mass index is 54.79 kg/m. Pt meets criteria for Obesity Class III based on current BMI.  Patient is consuming approximately 100% of meals at this time. Labs and  medications reviewed. CBG's: 113-185 x 24 hours.  No further nutrition interventions warranted at this time. RD contact information provided. If additional nutrition issues arise, please re-consult RD.   Gaynell Face, MS, RD, LDN Pager: [phone] Weekend/After Hours: [phone]

## 2018-04-11 NOTE — Progress Notes (Signed)
Progress Note  Patient Name: [name] Date of Encounter: 04/11/2018  Primary Cardiologist: [name], MD   Subjective   Breathing is a little better   Bleeding from mouth    Inpatient Medications    Scheduled Meds: . amLODipine  10 mg Oral Daily  . carvedilol  25 mg Oral BID  . ferrous sulfate  325 mg Oral BID  . furosemide  80 mg Intravenous BID  . glipiZIDE  10 mg Oral Q breakfast  . insulin aspart  0-15 Units Subcutaneous TID WC  . insulin aspart  0-5 Units Subcutaneous QHS  . losartan  100 mg Oral Daily  . metFORMIN  1,000 mg Oral BID WC  . oxybutynin  15 mg Oral QHS  . potassium chloride  15 mEq Oral Daily  . risperiDONE  4 mg Oral QHS  . rivaroxaban  20 mg Oral Q supper  . sodium chloride flush  3 mL Intravenous Q12H   Continuous Infusions: . sodium chloride     PRN Meds: sodium chloride, acetaminophen, albuterol, ALPRAZolam, nitroGLYCERIN, ondansetron (ZOFRAN) IV, sodium chloride flush, zolpidem   Vital Signs    Vitals:   04/10/18 1149 04/10/18 2004 04/11/18 0412 04/11/18 0416  BP: 119/73 (!) 142/76  137/69  Pulse: 93 (!) 106  83  Resp: 20 20  20   Temp: 98.2 F (36.8 C) 98 F (36.7 C)  97.6 F (36.4 C)  TempSrc: Oral Oral  Oral  SpO2: 94% 98%  98%  Weight:   (!) 158.7 kg (349 lb 12.8 oz)   Height:        Intake/Output Summary (Last 24 hours) at 04/11/2018 1146 Last data filed at 04/11/2018 0856 Gross per 24 hour  Intake 960 ml  Output 850 ml  Net 110 ml   Filed Weights   04/09/18 0646 04/10/18 0649 04/11/18 0412  Weight: (!) 157.3 kg (346 lb 11.2 oz) (!) 157.5 kg (347 lb 4.8 oz) (!) 158.7 kg (349 lb 12.8 oz)    Telemetry    Afib 90s   - Personally Reviewed  ECG      Physical Exam  Morbidly obese 52 yo in NAD   GEN: No acute distress.   Mouth   No lesions palpated   No obvious bleeding    Neck: Neck is full    Cardiac: Irreg irreg   no murmurs, rubs, or gallops.  Respiratory:Mild rales at bases  GI: Soft, nontender,  non-distended  MS: 1-2+ edema; No deformity. Neuro:  Nonfocal  Psych: Normal affect   Labs    Chemistry Recent Labs  Lab 04/08/18 0754 04/09/18 0532 04/10/18 0542 04/11/18 0542  NA 142 137 140 141  K 3.6 3.5 3.8 3.6  CL 104 99 103 103  CO2 28 28 29 30   GLUCOSE 179* 183* 181* 118*  BUN 8 12 14 15   CREATININE 0.66 0.77 0.69 0.70  CALCIUM 9.2 8.8* 9.0 9.0  PROT 7.3  --   --   --   ALBUMIN 3.5  --   --   --   AST 23  --   --   --   ALT 14  --   --   --   ALKPHOS 92  --   --   --   BILITOT 0.7  --   --   --   GFRNONAA >60 >60 >60 >60  GFRAA >60 >60 >60 >60  ANIONGAP 10 10 8 8      Hematology Recent Labs  Lab  04/07/18 1345 04/08/18 0754  WBC 5.2 5.2  RBC 4.36 4.44  HGB 11.6* 11.8*  HCT 38.2 39.1  MCV 87.6 88.1  MCH 26.6 26.6  MCHC 30.4 30.2  RDW 14.1 14.2  PLT 205 221    Cardiac Enzymes Recent Labs  Lab 04/07/18 2011 04/08/18 0128 04/08/18 0754  TROPONINI <0.03 <0.03 <0.03    Recent Labs  Lab 04/07/18 1401  TROPIPOC 0.00     BNPNo results for input(s): BNP, PROBNP in the last 168 hours.   DDimer No results for input(s): DDIMER in the last 168 hours.   Radiology    No results found.  Cardiac Studies    Patient Profile     52 y.o. female morbid obesity, persistent atrial fibrillation, chronic diastolic congestive heart failure, needed with worsening shortness of breath and volume overload    Assessment & Plan    1.  Atrial fibrllation  Rates controlled    2   CHF  Acute on chronic diastolic CHF   Volume is still up on exam   Note from yesterday sugg pt is getting extra fluid   Will have dietary come in to see pt    COntinue diuresis as still volume overloaded  3   Sleep apnea  Cannot tolerate CPAP    WIll need to address other options  4  Oral bleeding   Pt has not massess in mouth   Will have her save some from AM   ? Amount      For questions or updates, please contact [facility] Please consult [email or web address] for contact info  under Cardiology/STEMI.      Signed, Dorris Carnes, MD  04/11/2018, 11:46 AM

## 2018-04-12 LAB — BASIC METABOLIC PANEL
Anion gap: 11 (ref 5–15)
BUN: 15 mg/dL (ref 6–20)
CHLORIDE: 100 mmol/L (ref 98–111)
CO2: 28 mmol/L (ref 22–32)
Calcium: 8.9 mg/dL (ref 8.9–10.3)
Creatinine, Ser: 0.73 mg/dL (ref 0.44–1.00)
GFR calc Af Amer: >60 mL/min (ref >60)
GFR calc non Af Amer: >60 mL/min (ref >60)
GLUCOSE: 129 mg/dL — AB (ref 70–99)
POTASSIUM: 3.6 mmol/L (ref 3.5–5.1)
Sodium: 139 mmol/L (ref 135–145)

## 2018-04-12 LAB — GLUCOSE, CAPILLARY
GLUCOSE-CAPILLARY: 140 mg/dL — AB (ref 70–99)
GLUCOSE-CAPILLARY: 241 mg/dL — AB (ref 70–99)
GLUCOSE-CAPILLARY: 241 mg/dL — AB (ref 70–99)
Glucose-Capillary: 180 mg/dL — ABNORMAL HIGH (ref 70–99)

## 2018-04-12 MED ORDER — CHEWING GUM (ORBIT) SUGAR FREE: 1.0000 | CHEWING_GUM | Freq: Two times a day (BID) | ORAL | Status: DC

## 2018-04-12 MED ORDER — BIOTENE DRY MOUTH MT LIQD: 15.0000 mL | OROMUCOSAL | Status: DC | PRN

## 2018-04-12 NOTE — Progress Notes (Signed)
Patient was able to sleep most of the night with her Cpap on, she woke up around 4am asking for something to drink and said that she was coughing up blood. The patient was able to expectorate some in a sterile container that I left by the sink in her room. Pt was able to put her cpap back on and go back to bed, still sleeping at this time. Will continue to monitor.

## 2018-04-12 NOTE — Progress Notes (Signed)
Progress Note  Patient Name: [name] Date of Encounter: 04/12/2018  Primary Cardiologist: [name], MD   Subjective   Breathing is OK   NO CP    Inpatient Medications    Scheduled Meds: . amLODipine  10 mg Oral Daily  . carvedilol  25 mg Oral BID  . ferrous sulfate  325 mg Oral BID  . furosemide  80 mg Intravenous BID  . glipiZIDE  10 mg Oral Q breakfast  . insulin aspart  0-15 Units Subcutaneous TID WC  . insulin aspart  0-5 Units Subcutaneous QHS  . losartan  100 mg Oral Daily  . metFORMIN  1,000 mg Oral BID WC  . oxybutynin  15 mg Oral QHS  . potassium chloride  15 mEq Oral Daily  . risperiDONE  4 mg Oral QHS  . rivaroxaban  20 mg Oral Q supper  . sodium chloride flush  3 mL Intravenous Q12H   Continuous Infusions: . sodium chloride     PRN Meds: sodium chloride, acetaminophen, albuterol, ALPRAZolam, antiseptic oral rinse, nitroGLYCERIN, ondansetron (ZOFRAN) IV, sodium chloride flush, zolpidem   Vital Signs    Vitals:   04/11/18 2002 04/11/18 2100 04/12/18 0417 04/12/18 0754  BP: (!) 146/86  111/83 114/87  Pulse: (!) 104 96 80 75  Resp: 20 20 (!) 26   Temp: 100.3 F (37.9 C)  98.4 F (36.9 C) 97.7 F (36.5 C)  TempSrc: Oral  Oral Oral  SpO2: 96% 95% 97% 95%  Weight:   (!) 157.6 kg (347 lb 6.4 oz)   Height:        Intake/Output Summary (Last 24 hours) at 04/12/2018 0826 Last data filed at 04/12/2018 0758 Gross per 24 hour  Intake 1260 ml  Output 2600 ml  Net -1340 ml   Filed Weights   04/10/18 0649 04/11/18 0412 04/12/18 0417  Weight: (!) 157.5 kg (347 lb 4.8 oz) (!) 158.7 kg (349 lb 12.8 oz) (!) 157.6 kg (347 lb 6.4 oz)    Telemetry    Atrial fib  - Personally Reviewed  ECG      Physical Exam  Morbidly obese 51 yo in NAD   GEN: No acute distress.   Mouth   No lesions palpated   No obvious bleeding    Neck: Neck is full    Cardiac: Irreg irreg   no murmurs, rubs, or gallops.  Respiratory:Mild rales at bases  GI: Soft,  nontender, non-distended  MS: 1+ edema; No deformity. Neuro:  Nonfocal  Psych: Normal affect   Labs    Chemistry Recent Labs  Lab 04/08/18 0754  04/10/18 0542 04/11/18 0542 04/12/18 0630  NA 142   < > 140 141 139  K 3.6   < > 3.8 3.6 3.6  CL 104   < > 103 103 100  CO2 28   < > 29 30 28   GLUCOSE 179*   < > 181* 118* 129*  BUN 8   < > 14 15 15   CREATININE 0.66   < > 0.69 0.70 0.73  CALCIUM 9.2   < > 9.0 9.0 8.9  PROT 7.3  --   --   --   --   ALBUMIN 3.5  --   --   --   --   AST 23  --   --   --   --   ALT 14  --   --   --   --   ALKPHOS 92  --   --   --   --  BILITOT 0.7  --   --   --   --   GFRNONAA >60   < > >60 >60 >60  GFRAA >60   < > >60 >60 >60  ANIONGAP 10   < > 8 8 11    < > = values in this interval not displayed.     Hematology Recent Labs  Lab 04/07/18 1345 04/08/18 0754  WBC 5.2 5.2  RBC 4.36 4.44  HGB 11.6* 11.8*  HCT 38.2 39.1  MCV 87.6 88.1  MCH 26.6 26.6  MCHC 30.4 30.2  RDW 14.1 14.2  PLT 205 221    Cardiac Enzymes Recent Labs  Lab 04/07/18 2011 04/08/18 0128 04/08/18 0754  TROPONINI <0.03 <0.03 <0.03    Recent Labs  Lab 04/07/18 1401  TROPIPOC 0.00     BNPNo results for input(s): BNP, PROBNP in the last 168 hours.   DDimer No results for input(s): DDIMER in the last 168 hours.   Radiology    No results found.  Cardiac Studies    Patient Profile     52 y.o. female morbid obesity, persistent atrial fibrillation, chronic diastolic congestive heart failure, needed with worsening shortness of breath and volume overload    Assessment & Plan    1.  Atrial fibrllation     Continue rate control    ON anticoagulation    2   CHF  Acute on chronic diastolic CHF  Clnically volume does appear improved   Discussed Fluid restriction 1500 cc   Salt restriction   Appreciate input from dietary   3   Sleep apnea  Tried CPAP last night     4  Oral bleeding   Pt has not massess in mouth   Tube  With bloody fluid that she says she   spit out     Pt says saliva   Thin  NOt clotting   Pt not anemic   Will ask ENT to see pt here or as outpt  Given that she needs long term anticoagulation   For questions or updates, please contact [facility] Please consult [email or web address] for contact info under Cardiology/STEMI.      Signed, Dorris Carnes, MD  04/12/2018, 8:26 AM

## 2018-04-13 ENCOUNTER — Inpatient Hospital Stay (HOSPITAL_COMMUNITY): Payer: Medicaid Other

## 2018-04-13 LAB — CBC
HEMATOCRIT: 38.7 % (ref 36.0–46.0)
HEMOGLOBIN: 11.9 g/dL — AB (ref 12.0–15.0)
MCH: 26.9 pg (ref 26.0–34.0)
MCHC: 30.7 g/dL (ref 30.0–36.0)
MCV: 87.4 fL (ref 78.0–100.0)
Platelets: 235 10*3/uL (ref 150–400)
RBC: 4.43 MIL/uL (ref 3.87–5.11)
RDW: 14.3 % (ref 11.5–15.5)
WBC: 9.3 10*3/uL (ref 4.0–10.5)

## 2018-04-13 LAB — GLUCOSE, CAPILLARY
GLUCOSE-CAPILLARY: 148 mg/dL — AB (ref 70–99)
Glucose-Capillary: 169 mg/dL — ABNORMAL HIGH (ref 70–99)
Glucose-Capillary: 230 mg/dL — ABNORMAL HIGH (ref 70–99)
Glucose-Capillary: 216 mg/dL — ABNORMAL HIGH (ref 70–99)

## 2018-04-13 MED ORDER — DILTIAZEM HCL-DEXTROSE 100-5 MG/100ML-% IV SOLN (PREMIX)
5.0000 mg/h | INTRAVENOUS | Status: DC
  Administered 2018-04-13: 5 mg/h via INTRAVENOUS
  Filled 2018-04-13: qty 100

## 2018-04-13 MED ORDER — PANTOPRAZOLE SODIUM 40 MG PO TBEC
40.0000 mg | DELAYED_RELEASE_TABLET | Freq: Every day | ORAL | Status: DC
  Administered 2018-04-13 – 2018-04-16 (×4): 40 mg via ORAL
  Filled 2018-04-13: qty 3
  Filled 2018-04-13 (×4): qty 1

## 2018-04-13 MED ORDER — DILTIAZEM HCL 60 MG PO TABS
60.0000 mg | ORAL_TABLET | Freq: Three times a day (TID) | ORAL | Status: DC
  Administered 2018-04-13 – 2018-04-16 (×9): 60 mg via ORAL
  Filled 2018-04-13 (×9): qty 1

## 2018-04-13 MED ORDER — DILTIAZEM LOAD VIA INFUSION
10.0000 mg | Freq: Once | INTRAVENOUS | Status: AC
  Administered 2018-04-13: 10 mg via INTRAVENOUS
  Filled 2018-04-13: qty 10

## 2018-04-13 NOTE — Progress Notes (Addendum)
Progress Note  Patient Name: [name] Date of Encounter: 04/13/2018  Primary Cardiologist: [name], MD   Subjective   Pt very sleepy   Nods off while answering short questions   Inpatient Medications    Scheduled Meds: . amLODipine  10 mg Oral Daily  . carvedilol  25 mg Oral BID  . ferrous sulfate  325 mg Oral BID  . furosemide  80 mg Intravenous BID  . glipiZIDE  10 mg Oral Q breakfast  . insulin aspart  0-15 Units Subcutaneous TID WC  . insulin aspart  0-5 Units Subcutaneous QHS  . losartan  100 mg Oral Daily  . metFORMIN  1,000 mg Oral BID WC  . oxybutynin  15 mg Oral QHS  . potassium chloride  15 mEq Oral Daily  . risperiDONE  4 mg Oral QHS  . rivaroxaban  20 mg Oral Q supper  . sodium chloride flush  3 mL Intravenous Q12H   Continuous Infusions: . sodium chloride    . diltiazem (CARDIZEM) infusion 5 mg/hr (04/13/18 0654)   PRN Meds: sodium chloride, acetaminophen, albuterol, ALPRAZolam, antiseptic oral rinse, nitroGLYCERIN, ondansetron (ZOFRAN) IV, sodium chloride flush, zolpidem   Vital Signs    Vitals:   04/12/18 1643 04/12/18 2041 04/13/18 0355 04/13/18 0758  BP: (!) 143/94 134/69 (!) 134/104   Pulse: 90 74 (!) 125   Resp: 18 19 (!) 22   Temp: 98 F (36.7 C) 97.8 F (36.6 C) 100.3 F (37.9 C)   TempSrc: Oral Oral Oral   SpO2: 98% 99% 94%   Weight:    (!) 155.5 kg (342 lb 12.8 oz)  Height:        Intake/Output Summary (Last 24 hours) at 04/13/2018 0906 Last data filed at 04/13/2018 0654 Gross per 24 hour  Intake 1180 ml  Output 3100 ml  Net -1920 ml   Filed Weights   04/11/18 0412 04/12/18 0417 04/13/18 0758  Weight: (!) 158.7 kg (349 lb 12.8 oz) (!) 157.6 kg (347 lb 6.4 oz) (!) 155.5 kg (342 lb 12.8 oz)    Telemetry     Afib with RVR   Up to 140 bpm - Personally Reviewed  ECG      Physical Exam  Morbidly obese 52 yo in NAD   GEN: No acute distress.   Mouth   No lesions palpated   No obvious bleeding    Neck: Neck is  full    Cardiac: Irreg irreg   no murmurs, rubs, or gallops.  Respiratory:Rel clear    GI: Soft, nontender, non-distended  MS: 1+ edema; No deformity. Neuro:  Nonfocal  Psych: Normal affect   Labs    Chemistry Recent Labs  Lab 04/08/18 0754  04/10/18 0542 04/11/18 0542 04/12/18 0630  NA 142   < > 140 141 139  K 3.6   < > 3.8 3.6 3.6  CL 104   < > 103 103 100  CO2 28   < > 29 30 28   GLUCOSE 179*   < > 181* 118* 129*  BUN 8   < > 14 15 15   CREATININE 0.66   < > 0.69 0.70 0.73  CALCIUM 9.2   < > 9.0 9.0 8.9  PROT 7.3  --   --   --   --   ALBUMIN 3.5  --   --   --   --   AST 23  --   --   --   --  ALT 14  --   --   --   --   ALKPHOS 92  --   --   --   --   BILITOT 0.7  --   --   --   --   GFRNONAA >60   < > >60 >60 >60  GFRAA >60   < > >60 >60 >60  ANIONGAP 10   < > 8 8 11    < > = values in this interval not displayed.     Hematology Recent Labs  Lab 04/07/18 1345 04/08/18 0754  WBC 5.2 5.2  RBC 4.36 4.44  HGB 11.6* 11.8*  HCT 38.2 39.1  MCV 87.6 88.1  MCH 26.6 26.6  MCHC 30.4 30.2  RDW 14.1 14.2  PLT 205 221    Cardiac Enzymes Recent Labs  Lab 04/07/18 2011 04/08/18 0128 04/08/18 0754  TROPONINI <0.03 <0.03 <0.03    Recent Labs  Lab 04/07/18 1401  TROPIPOC 0.00     BNPNo results for input(s): BNP, PROBNP in the last 168 hours.   DDimer No results for input(s): DDIMER in the last 168 hours.   Radiology    No results found.  Cardiac Studies    Patient Profile     52 y.o. female morbid obesity, persistent atrial fibrillation, chronic diastolic congestive heart failure, needed with worsening shortness of breath and volume overload    Assessment & Plan    1.  Atrial fibrllation     Rates jumped last night    Now on IV diltizaem anss well as other meds     Will switch to PO diltiazem and consolidate Ca blockers tomorrow Cont anticoagulation    2   CHF  Acute on chronic diastolic CHF Volume is mildly increased   Wt down  Renal function  OK   I would continue IV lasix for now    3   Sleep apnea    Not using at home or here   Watching her nap she definitely should use    Too tired this am to address    4  Oral bleeding   ENT has seen   The fluid I saw yesteday was very bloody saliva which she said she spit out every morning    Had about 3 ounces in cup WIll add protonix to regimen   Follow    5  Fever   Will check CBC and UA as well as blood culture   I do not see obvious source    Repeat CXR   For questions or updates, please contact [facility] Please consult [email or web address] for contact info under Cardiology/STEMI.      Signed, Dorris Carnes, MD  04/13/2018, 9:06 AM

## 2018-04-13 NOTE — Consult Note (Signed)
Reason for Consult: Hemoptysis Referring Physician: Hospitalist  [name] is an 52 y.o. female.  HPI: 52 year old with problems with atrial fibrillation and has been on blood thinners. She has had for what her history indicates about a week of waking up in the morning with some blood in her mouth. It's mixed with mucus. It only happens 1 time and the rest of the day she has no issues or problems. It is not every morning that it occurs. She has no known lesions or problems in her mouth. No soreness of throat. No change in her voice. She did not have any bleeding or blood per usual the morning.  Past Medical History:  Diagnosis Date  . Bipolar disease, chronic ([place])   . Chest pain    a. 2012 Myoview: EF 63%, no isch/infarct;  b. 04/2016 Lexiscan MV: EF 73%, no ischemia/infarct-->Low risk.  . Chronic diastolic CHF (congestive heart failure) ([place])    a. 2015 Echo: EF 55-60%, Gr2 DD;  b. 09/2015 Echo: EF 60-65%, no rwma, mod dil LA, PASP 93mHg.  .Marland KitchenHistory of thyrotoxicosis   . Hypertension   . Hypertensive heart disease 10/18/2013  . Insulin dependent type 2 diabetes mellitus, uncontrolled [place]   . Mediastinal adenopathy   . Morbid obesity with BMI of 50.0-59.9, adult ([place]   . OSA (obstructive sleep apnea) 03/06/2011  . [place]    Past Surgical History:  Procedure Laterality Date  . CARDIOVERSION N/A 04/05/2018   Procedure: CARDIOVERSION;  Surgeon: CLelon Perla MD;  [facility];  Service: Cardiovascular;  Laterality: N/A;  . None      Family History  Problem Relation Age of Onset  . Heart failure Father   . Stroke Father   . Hypertension Mother   . Heart disease Maternal Grandfather     Social History:  reports that she has never smoked. She has never used smokeless tobacco. She reports that she does not drink alcohol or use drugs.  Allergies:  Allergies  Allergen Reactions  . Acetaminophen Other (See Comments)   Seizure-like "fits" as a child  . Caffeine     Tense, anxiety, increased urination  . FIran[Dapagliflozin] Other (See Comments)    Hallucinations, drop in blood sugar  . Lisinopril Rash    Rash with lisinopril; but fosinopril is ok per patient    Medications: I have reviewed the patient's current medications.  Results for orders placed or performed during the hospital encounter of 04/07/18 (from the past 48 hour(s))  Glucose, capillary     Status: Abnormal   Collection Time: 04/11/18 11:30 AM  Result Value Ref Range   Glucose-Capillary 161 (H) 70 - 99 mg/dL  Glucose, capillary     Status: Abnormal   Collection Time: 04/11/18  4:33 PM  Result Value Ref Range   Glucose-Capillary 143 (H) 70 - 99 mg/dL  Glucose, capillary     Status: None   Collection Time: 04/11/18  9:10 PM  Result Value Ref Range   Glucose-Capillary 93 70 - 99 mg/dL   Comment 1 Notify RN    Comment 2 Document in Chart   Basic metabolic panel     Status: Abnormal   Collection Time: 04/12/18  6:30 AM  Result Value Ref Range   Sodium 139 135 - 145 mmol/L   Potassium 3.6 3.5 - 5.1 mmol/L   Chloride 100 98 - 111 mmol/L    Comment: Please note change in reference range.   CO2 28  22 - 32 mmol/L   Glucose, Bld 129 (H) 70 - 99 mg/dL    Comment: Please note change in reference range.   BUN 15 6 - 20 mg/dL    Comment: Please note change in reference range.   Creatinine, Ser 0.73 0.44 - 1.00 mg/dL   Calcium 8.9 8.9 - 10.3 mg/dL   GFR calc non Af Amer >60 >60 mL/min   GFR calc Af Amer >60 >60 mL/min    Comment: (NOTE) The eGFR has been calculated using the CKD EPI equation. This calculation has not been validated in all clinical situations. eGFR's persistently <60 mL/min signify possible Chronic Kidney Disease.    Anion gap 11 5 - 15    Comment: Performed at [facility] [address] [place], [place] [address]  Glucose, capillary     Status: Abnormal   Collection Time: 04/12/18  7:42 AM  Result Value  Ref Range   Glucose-Capillary 140 (H) 70 - 99 mg/dL   Comment 1 Notify RN   Glucose, capillary     Status: Abnormal   Collection Time: 04/12/18 11:20 AM  Result Value Ref Range   Glucose-Capillary 180 (H) 70 - 99 mg/dL   Comment 1 Notify RN   Glucose, capillary     Status: Abnormal   Collection Time: 04/12/18  4:31 PM  Result Value Ref Range   Glucose-Capillary 241 (H) 70 - 99 mg/dL   Comment 1 Notify RN   Glucose, capillary     Status: Abnormal   Collection Time: 04/12/18  8:37 PM  Result Value Ref Range   Glucose-Capillary 241 (H) 70 - 99 mg/dL  Glucose, capillary     Status: Abnormal   Collection Time: 04/13/18  7:40 AM  Result Value Ref Range   Glucose-Capillary 169 (H) 70 - 99 mg/dL   Comment 1 Notify RN     No results found.  ROS Blood pressure (!) 134/104, pulse (!) 125, temperature 100.3 F (37.9 C), temperature source Oral, resp. rate (!) 22, height '5\' 7"'  (1.702 m), weight (!) 155.5 kg (342 lb 12.8 oz), SpO2 94 %. Physical Exam  Constitutional: She appears well-developed and well-nourished.  HENT:  Head: Normocephalic and atraumatic.  Mouth/Throat: Oropharynx is clear and moist.  There is no evidence of any source for bleeding from her mouth. The teeth are somewhat in poor repair. The oropharynx also has no evidence or obvious the areas for bleeding. Fiberoptic examination reveals no lesions in the nose or nasopharynx. The structures in the hypopharynx and larynx as well as remaining oropharynx are all without evidence of any source for bleeding or lesions. Both vocal cords move normally. The epiglottis looks normal.  Eyes: Pupils are equal, round, and reactive to light. Conjunctivae are normal.  Neck: Normal range of motion. Neck supple.    Assessment/Plan: Hematemesis-I don't see any source in her upper airway that would indicate where the blood is coming from. It's occurring after a nights sleep and she wakes up spitting the mucus mixed with blood out once. It  would seem to me this is more likely coming from her GI tract given it doesn't present any further during the day. If this continues I will reevaluate further but right now I see no source. She did not have the problem this morning.  Melissa Montane 04/13/2018, 9:10 AM

## 2018-04-13 NOTE — Progress Notes (Signed)
Paged Cardiologist on call concerning pt's heart rate being sustained in the 130s/140s, pt still with no complaints except mild stomach pain, patient refusing to put her CPAP on Vanessa Mends RN 4:05 AM 04/13/2018

## 2018-04-13 NOTE — Progress Notes (Signed)
Paged Cardiologist on call concerning patient's heart rate sustaining in the 130s/140s, patient resting with no complaints except for mild stomach pain, awaiting callback Neta Mends RN 3:48 AM 04/13/2018

## 2018-04-14 LAB — BASIC METABOLIC PANEL
ANION GAP: 10 (ref 5–15)
BUN: 18 mg/dL (ref 6–20)
CALCIUM: 8.5 mg/dL — AB (ref 8.9–10.3)
CO2: 29 mmol/L (ref 22–32)
Chloride: 98 mmol/L (ref 98–111)
Creatinine, Ser: 1.05 mg/dL — ABNORMAL HIGH (ref 0.44–1.00)
GFR calc Af Amer: >60 mL/min (ref >60)
GLUCOSE: 260 mg/dL — AB (ref 70–99)
Potassium: 3.5 mmol/L (ref 3.5–5.1)
SODIUM: 137 mmol/L (ref 135–145)

## 2018-04-14 LAB — URINALYSIS, ROUTINE W REFLEX MICROSCOPIC
Bilirubin Urine: NEGATIVE
Glucose, UA: NEGATIVE mg/dL
KETONES UR: NEGATIVE mg/dL
Nitrite: NEGATIVE
PH: 5 (ref 5.0–8.0)
PROTEIN: 30 mg/dL — AB
Specific Gravity, Urine: 1.008 (ref 1.005–1.030)

## 2018-04-14 LAB — GLUCOSE, CAPILLARY
GLUCOSE-CAPILLARY: 164 mg/dL — AB (ref 70–99)
GLUCOSE-CAPILLARY: 233 mg/dL — AB (ref 70–99)
GLUCOSE-CAPILLARY: 178 mg/dL — AB (ref 70–99)
Glucose-Capillary: 213 mg/dL — ABNORMAL HIGH (ref 70–99)

## 2018-04-14 MED ORDER — INSULIN ASPART 100 UNIT/ML ~~LOC~~ SOLN
0.0000 [IU] | Freq: Three times a day (TID) | SUBCUTANEOUS | Status: DC
  Administered 2018-04-15: 3 [IU] via SUBCUTANEOUS
  Administered 2018-04-15: 2 [IU] via SUBCUTANEOUS
  Administered 2018-04-16: 3 [IU] via SUBCUTANEOUS
  Administered 2018-04-16: 2 [IU] via SUBCUTANEOUS

## 2018-04-14 NOTE — Progress Notes (Signed)
Progress Note  Patient Name: [name] Date of Encounter: 04/14/2018  Primary Cardiologist: [name], MD   Subjective   Breathing is OK   NOt a baseline   No CP    Inpatient Medications    Scheduled Meds: . carvedilol  25 mg Oral BID  . diltiazem  60 mg Oral Q8H  . ferrous sulfate  325 mg Oral BID  . furosemide  80 mg Intravenous BID  . glipiZIDE  10 mg Oral Q breakfast  . insulin aspart  0-15 Units Subcutaneous TID WC  . insulin aspart  0-5 Units Subcutaneous QHS  . losartan  100 mg Oral Daily  . metFORMIN  1,000 mg Oral BID WC  . oxybutynin  15 mg Oral QHS  . pantoprazole  40 mg Oral Q1200  . potassium chloride  15 mEq Oral Daily  . risperiDONE  4 mg Oral QHS  . rivaroxaban  20 mg Oral Q supper  . sodium chloride flush  3 mL Intravenous Q12H   Continuous Infusions: . sodium chloride     PRN Meds: sodium chloride, acetaminophen, albuterol, ALPRAZolam, antiseptic oral rinse, nitroGLYCERIN, ondansetron (ZOFRAN) IV, sodium chloride flush, zolpidem   Vital Signs    Vitals:   04/13/18 0920 04/13/18 1234 04/13/18 2059 04/14/18 0520  BP: 101/64 100/61 (!) 114/94 (!) 90/56  Pulse:  (!) 102 89 82  Resp: 20 20 18  (!) 22  Temp: (!) 101.1 F (38.4 C) 100 F (37.8 C) 98.6 F (37 C) (!) 97.5 F (36.4 C)  TempSrc: Oral Oral Oral Oral  SpO2:  91% 90% 92%  Weight:    (!) 158.2 kg (348 lb 11.2 oz)  Height:        Intake/Output Summary (Last 24 hours) at 04/14/2018 0741 Last data filed at 04/14/2018 0524 Gross per 24 hour  Intake 1080 ml  Output 1200 ml  Net -120 ml   Filed Weights   04/12/18 0417 04/13/18 0758 04/14/18 0520  Weight: (!) 157.6 kg (347 lb 6.4 oz) (!) 155.5 kg (342 lb 12.8 oz) (!) 158.2 kg (348 lb 11.2 oz)    Telemetry    Atrial fib   Rates 80s   - Personally Reviewed  ECG      Physical Exam  Morbidly obese 52 yo in NAD   GEN: No acute distress.   Mouth   No lesions palpated   No obvious bleeding    Neck: Neck is full    Cardiac:  Irreg irreg   no murmurs, rubs, or gallops.  Respiratory:Rhonchi    GI: Soft, nontender, non-distended  MS: 1+ edema  Soft  ; No deformity. Neuro:  Nonfocal  Psych: Normal affect   Labs    Chemistry Recent Labs  Lab 04/08/18 0754  04/10/18 0542 04/11/18 0542 04/12/18 0630  NA 142   < > 140 141 139  K 3.6   < > 3.8 3.6 3.6  CL 104   < > 103 103 100  CO2 28   < > 29 30 28   GLUCOSE 179*   < > 181* 118* 129*  BUN 8   < > 14 15 15   CREATININE 0.66   < > 0.69 0.70 0.73  CALCIUM 9.2   < > 9.0 9.0 8.9  PROT 7.3  --   --   --   --   ALBUMIN 3.5  --   --   --   --   AST 23  --   --   --   --  ALT 14  --   --   --   --   ALKPHOS 92  --   --   --   --   BILITOT 0.7  --   --   --   --   GFRNONAA >60   < > >60 >60 >60  GFRAA >60   < > >60 >60 >60  ANIONGAP 10   < > 8 8 11    < > = values in this interval not displayed.     Hematology Recent Labs  Lab 04/07/18 1345 04/08/18 0754 04/13/18 1250  WBC 5.2 5.2 9.3  RBC 4.36 4.44 4.43  HGB 11.6* 11.8* 11.9*  HCT 38.2 39.1 38.7  MCV 87.6 88.1 87.4  MCH 26.6 26.6 26.9  MCHC 30.4 30.2 30.7  RDW 14.1 14.2 14.3  PLT 205 221 235    Cardiac Enzymes Recent Labs  Lab 04/07/18 2011 04/08/18 0128 04/08/18 0754  TROPONINI <0.03 <0.03 <0.03    Recent Labs  Lab 04/07/18 1401  TROPIPOC 0.00     BNPNo results for input(s): BNP, PROBNP in the last 168 hours.   DDimer No results for input(s): DDIMER in the last 168 hours.   Radiology    Dg Chest 2 View  Result Date: 04/13/2018 CLINICAL DATA:  Fever,abdomen pain EXAM: CHEST - 2 VIEW COMPARISON:  04/07/2018 FINDINGS: Mild interstitial edema or infiltrates involving primarily the bases. Interval improvement in central pulmonary vascular congestion. No confluent airspace disease. Stable cardiomegaly. No effusion. Visualized bones unremarkable. IMPRESSION: 1. Improving central pulmonary vascular congestion with residual interstitial edema bilaterally. 2. Stable cardiomegaly.  Electronically Signed   By: Lucrezia Europe M.D.   On: 04/13/2018 14:53    Cardiac Studies    Patient Profile     52 y.o. female morbid obesity, persistent atrial fibrillation, chronic diastolic congestive heart failure, needed with worsening shortness of breath and volume overload    Assessment & Plan    1.  Atrial fibrllation      Rates OK  Continue current regimen including anticoagulation     2   CHF  Acute on chronic diastolic CHF  Volume is improving   Not quite at baseline    Discussed/reviewed Na and fluid restriction   Labs pending   Wo7uld like to give 1 more day IV lasix    3   Sleep apnea     Using CPAP now.    4  Oral bleeding     5  Fever  Afebrile today   WBC normal  UA negative   Cult ending   CXR improving   Mild congestion     For questions or updates, please contact [facility] Please consult [email or web address] for contact info under Cardiology/STEMI.      Signed, Dorris Carnes, MD  04/14/2018, 7:41 AM

## 2018-04-15 ENCOUNTER — Other Ambulatory Visit: Payer: Self-pay | Admitting: Physician Assistant

## 2018-04-15 ENCOUNTER — Other Ambulatory Visit: Payer: Self-pay

## 2018-04-15 DIAGNOSIS — N179 Acute kidney failure, unspecified: Secondary | ICD-10-CM

## 2018-04-15 DIAGNOSIS — I5033 Acute on chronic diastolic (congestive) heart failure: Secondary | ICD-10-CM

## 2018-04-15 LAB — GLUCOSE, CAPILLARY
GLUCOSE-CAPILLARY: 191 mg/dL — AB (ref 70–99)
Glucose-Capillary: 130 mg/dL — ABNORMAL HIGH (ref 70–99)
Glucose-Capillary: 147 mg/dL — ABNORMAL HIGH (ref 70–99)
Glucose-Capillary: 158 mg/dL — ABNORMAL HIGH (ref 70–99)

## 2018-04-15 MED ORDER — PANTOPRAZOLE SODIUM 40 MG PO TBEC: 40.0000 mg | DELAYED_RELEASE_TABLET | Freq: Every day | ORAL | 3 refills | Status: DC

## 2018-04-15 MED ORDER — FUROSEMIDE 40 MG PO TABS
40.0000 mg | ORAL_TABLET | Freq: Two times a day (BID) | ORAL | Status: DC
  Administered 2018-04-16: 40 mg via ORAL
  Filled 2018-04-15: qty 1

## 2018-04-15 MED ORDER — POTASSIUM CHLORIDE CRYS ER 20 MEQ PO TBCR
20.0000 meq | EXTENDED_RELEASE_TABLET | Freq: Two times a day (BID) | ORAL | Status: DC
Start: 2018-04-15 — End: 2018-04-16
  Administered 2018-04-15 – 2018-04-16 (×3): 20 meq via ORAL
  Filled 2018-04-15: qty 6
  Filled 2018-04-15 (×2): qty 1
  Filled 2018-04-15: qty 6
  Filled 2018-04-15: qty 1

## 2018-04-15 MED ORDER — FUROSEMIDE 80 MG PO TABS: 80.0000 mg | ORAL_TABLET | Freq: Two times a day (BID) | ORAL | 3 refills | Status: DC

## 2018-04-15 MED ORDER — FERROUS SULFATE 325 (65 FE) MG PO TABS: 325.0000 mg | ORAL_TABLET | Freq: Two times a day (BID) | ORAL | 3 refills | Status: DC

## 2018-04-15 MED ORDER — DILTIAZEM HCL ER COATED BEADS 180 MG PO CP24
180.0000 mg | ORAL_CAPSULE | Freq: Every day | ORAL | Status: DC
  Administered 2018-04-16: 180 mg via ORAL
  Filled 2018-04-15 (×2): qty 3
  Filled 2018-04-15: qty 1

## 2018-04-15 MED ORDER — POTASSIUM CHLORIDE CRYS ER 20 MEQ PO TBCR: 20.0000 meq | EXTENDED_RELEASE_TABLET | Freq: Two times a day (BID) | ORAL | 6 refills | Status: DC

## 2018-04-15 MED ORDER — DILTIAZEM HCL ER COATED BEADS 180 MG PO CP24: 180.0000 mg | ORAL_CAPSULE | Freq: Every day | ORAL | Status: DC

## 2018-04-15 MED ORDER — DILTIAZEM HCL ER COATED BEADS 180 MG PO CP24: 180.0000 mg | ORAL_CAPSULE | Freq: Every day | ORAL | 11 refills | Status: DC

## 2018-04-15 MED FILL — ?PANTOPRAZOLE SO DR 40MG TA: 40 | 30 days supply | Qty: 30 | Fill #0

## 2018-04-15 MED FILL — POTASSIUM CL ER 20 MEQ TAB: 20 | 30 days supply | Qty: 60 | Fill #0

## 2018-04-15 MED FILL — DILTIAZEM 24HR ER 180 MG CA: 180 | 30 days supply | Qty: 30 | Fill #0

## 2018-04-15 NOTE — Progress Notes (Addendum)
Progress Note  Patient Name: [name] Date of Encounter: 04/15/2018  Primary Cardiologist: [name], MD   Subjective  Breathing is OK   No CP   Sleeping soundly flat    Inpatient Medications    Scheduled Meds: . carvedilol  25 mg Oral BID  . diltiazem  60 mg Oral Q8H  . ferrous sulfate  325 mg Oral BID  . furosemide  80 mg Intravenous BID  . glipiZIDE  10 mg Oral Q breakfast  . insulin aspart  0-15 Units Subcutaneous TID WC  . insulin aspart  0-5 Units Subcutaneous QHS  . losartan  100 mg Oral Daily  . metFORMIN  1,000 mg Oral BID WC  . oxybutynin  15 mg Oral QHS  . pantoprazole  40 mg Oral Q1200  . potassium chloride  15 mEq Oral Daily  . risperiDONE  4 mg Oral QHS  . rivaroxaban  20 mg Oral Q supper  . sodium chloride flush  3 mL Intravenous Q12H   Continuous Infusions: . sodium chloride     PRN Meds: sodium chloride, acetaminophen, albuterol, ALPRAZolam, antiseptic oral rinse, nitroGLYCERIN, ondansetron (ZOFRAN) IV, sodium chloride flush, zolpidem   Vital Signs    Vitals:   04/14/18 2007 04/15/18 0400 04/15/18 0940 04/15/18 1221  BP:  91/63 105/69 (!) 93/45  Pulse: 85 66 90 79  Resp: 18 20  18   Temp:  98.6 F (37 C)  98.6 F (37 C)  TempSrc:  Oral  Oral  SpO2: 100% 99%  93%  Weight:  (!) 347 lb 1.6 oz (157.4 kg)    Height:        Intake/Output Summary (Last 24 hours) at 04/15/2018 1406 Last data filed at 04/15/2018 1233 Gross per 24 hour  Intake 480 ml  Output 3600 ml  Net -3120 ml   Filed Weights   04/13/18 0758 04/14/18 0520 04/15/18 0400  Weight: (!) 342 lb 12.8 oz (155.5 kg) (!) 348 lb 11.2 oz (158.2 kg) (!) 347 lb 1.6 oz (157.4 kg)    Telemetry    Atrial fib   Rates 70s to 90s     - Personally Reviewed  ECG      Physical Exam  Morbidly obese 52 yo in NAD   GEN: No acute distress.   Mouth   No lesions palpated   No obvious bleeding    Neck: Neck is full    Cardiac: Irreg irreg   no murmurs, rubs, or gallops.    Respiratory:Rhonchi    GI: Soft, nontender, non-distended  MS:Tr   edema  Soft  ; No deformity. Neuro:  Nonfocal  Psych: Normal affect   Labs    Chemistry Recent Labs  Lab 04/11/18 0542 04/12/18 0630 04/14/18 0916  NA 141 139 137  K 3.6 3.6 3.5  CL 103 100 98  CO2 30 28 29   GLUCOSE 118* 129* 260*  BUN 15 15 18   CREATININE 0.70 0.73 1.05*  CALCIUM 9.0 8.9 8.5*  GFRNONAA >60 >60 >60  GFRAA >60 >60 >60  ANIONGAP 8 11 10      Hematology Recent Labs  Lab 04/13/18 1250  WBC 9.3  RBC 4.43  HGB 11.9*  HCT 38.7  MCV 87.4  MCH 26.9  MCHC 30.7  RDW 14.3  PLT 235    Cardiac Enzymes No results for input(s): TROPONINI in the last 168 hours.  No results for input(s): TROPIPOC in the last 168 hours.   BNPNo results for input(s): BNP, PROBNP in  the last 168 hours.   DDimer No results for input(s): DDIMER in the last 168 hours.   Radiology    No results found.  Cardiac Studies    Patient Profile     52 y.o. female morbid obesity, persistent atrial fibrillation, chronic diastolic congestive heart failure, needed with worsening shortness of breath and volume overload    Assessment & Plan    1.  Atrial fibrllation      Rates OK  Continue current regimen including anticoagulation     2   CHF  Acute on chronic diastolic CHF Volume is improved  Difficult given size She has been on 80 bid IV asix    I would switch to PO bid as well  I think once she is outside of hospital compliance will net be as good. She   Will need 20 bid of KCL as well (was on elixir   Will switch to pills)    Labs on Tuesday (BMET) F/U in clinic  In 2 to 3 wks   3   Sleep apnea     Needs to use CPAP  4   NSVT   ON 7 beat burst     4  Oral bleeding NOne for past couple days      5  Fever  Afebrile today   WBC normal  UA negative   Cult ending   CXR improving   Mild congestion    Work up negative    OK to D/C home   Volume is not bad   Concern that pt will have decompensation again    Discussed diet and fluid restriction .   Needs to use CPAP    For questions or updates, please contact [place] Please consult [email or web address] for contact info under Cardiology/STEMI.      Signed, Dorris Carnes, MD  04/15/2018, 2:06 PM

## 2018-04-15 NOTE — Progress Notes (Signed)
Patient had 7 beats VT this am while at rest.  She denied chest pain.  Bhagat, PA informed via text page.

## 2018-04-15 NOTE — Progress Notes (Signed)
Pt has orders to be discharged. Discharge instructions given and pt has no additional questions at this time. Medication regimen reviewed and pt educated. Pt verbalized understanding and has no additional questions. Telemetry box removed. IV removed and site in good condition. Pt stable and waiting for transportation. 

## 2018-04-15 NOTE — Discharge Summary (Signed)
Discharge Summary    Patient ID: [name],  MRN: [id number], DOB/AGE: 1966/03/30 52 y.o.  Admit date: 04/07/2018 Discharge date: 04/15/2018  Primary Care Provider: [name] Primary Cardiologist: [name], MD  Discharge Diagnoses    Principal Problem:   Acute on chronic diastolic congestive heart failure [facility]) Active Problems:   Essential hypertension   Acute on chronic diastolic CHF (congestive heart failure) ([place])   Uncontrolled type 2 diabetes mellitus with hyperglycemia (HCC)   Persistent atrial fibrillation (HCC)   Pulmonary hypertension   OSA on CPAP  Allergies Allergies  Allergen Reactions  . Acetaminophen Other (See Comments)    Seizure-like "fits" as a child  . Caffeine     Tense, anxiety, increased urination  . [place] [Dapagliflozin] Other (See Comments)    Hallucinations, drop in blood sugar  . Lisinopril Rash    Rash with lisinopril; but fosinopril is ok per patient    Diagnostic Studies/Procedures    Echo 04/08/18 Study Conclusions  - Left ventricle: The cavity size was normal. Wall thickness was   increased in a pattern of mild LVH. Systolic function was normal.   The estimated ejection fraction was in the range of 55% to 60%. - Mitral valve: There was mild regurgitation. - Left atrium: The atrium was mildly dilated. - Right atrium: The atrium was mildly dilated. - Tricuspid valve: There was moderate regurgitation. - Pulmonary arteries: PA peak pressure: 41 mm Hg (S). - Pericardium, extracardiac: A trivial pericardial effusion was   identified.   History of Present Illness     [name] is a 52 y.o. female with a hx of D-CHF, persistent Afib, HTN,obesity,MV 2017 w/ nl perfusionand EF 73%, SVT, chronic intermittent chest pain, CHA2DS2-VASc=3(female, HTN, CHF) on Xarelto, hypertensive heart disease, OSA, mediastinal adenopathy, basilar groundglass infiltrates, and prior Mycobacterium avium infection admitted 7/11 with  acute on chronic diastolic CHF.   Ms. Philson was seen in the office 07/019. Noted hypoxic w/ exertion, but not able to get O2 covered. Wt up slightly, but volume status good by exam. Compliance w/ anticoagulation good. Still in Afib, DCCV scheduled.  07/09, pt came in and had DCCV total 3 times, 3rd time at 200J w/ anterior pressure. Tolerated well but remained in Afib>>referred to Afib clinic but has not been seen yet.  Patient woke up with a blood clot in her mouth at day of presentation.   This was concerning to her and she came into the emergency room.  She has not been coughing or vomiting.  No known dark or tarry stools. She reported chest pain, it hurts to take a deep breath.  This is different from the previous chest pain which she has had in the past.  That chest pain was associated with chest wall tenderness in the left midsternal area. Noted trending up weight. Noted volume overloaded and admitted for diuresis.   Hospital Course     Consultants: Surgery/ENT  1.  Acute on chronic diastolic congestive heart failure - Admitted for IV diuresis. Net I & O negative 9.4L. However weight remained stable at 346-348lb. She improved clinically. Discussed Fluid restriction 1500 cc. Salt restriction.  Echo showed normal left ventricular systolic function with an EF of 55 to 60%.  She has grade 2 diastolic dysfunction.    2.  Pulmonary hypertension: She has an estimated PA pressure of 58 on prior echo. This admitted it was 84mm Hg by echo. It was suspecedt that she has obesity hypoventilation. She also  has known obstructive sleep apnea.  She has not tolerated CPAP in the past.  Tried different forms of CPAP.  She desperately needs to lose weight. Suspected that this pulmonary hypertension is contributing greatly to her shortness of breath and to her leg edema.  3.  Morbid obesity - The patient was adamant that she does not eat a lot of salt. However she clearly overeats. Seen by dietician.    4.  Atrial fibrillation: She is had multiple cardioversions.  She has been referred to the atrial fibrillation clinic but has not made that appointment yet. She has obstructive sleep apnea which is certainly contributing to her atrial fibrillation. Rate relatively controlled. Added cardizem CD. DC amlodipine due to soft BP.   5. Hematemesis - The small blood clot in her mouth. Seen by ENT who felt that source in her upper airway that would indicate where the blood is coming from. It's occurring after a nights sleep and she wakes up spitting the mucus mixed with blood out once. It would seem more likely coming from her GI tract given it doesn't present any further during the day. Recommended continue to observe.   6. OSA - as above. Encouraged compliance with CPAP.    Discharge Vitals Blood pressure (!) 93/45, pulse 79, temperature 98.6 F (37 C), temperature source Oral, resp. rate 18, height 5\' 7"  (1.702 m), weight (!) 347 lb 1.6 oz (157.4 kg), SpO2 93 %.  Filed Weights   04/13/18 0758 04/14/18 0520 04/15/18 0400  Weight: (!) 342 lb 12.8 oz (155.5 kg) (!) 348 lb 11.2 oz (158.2 kg) (!) 347 lb 1.6 oz (157.4 kg)    Labs & Radiologic Studies    CBC Recent Labs    04/13/18 1250  WBC 9.3  HGB 11.9*  HCT 38.7  MCV 87.4  PLT 976   Basic Metabolic Panel Recent Labs    04/14/18 0916  NA 137  K 3.5  CL 98  CO2 29  GLUCOSE 260*  BUN 18  CREATININE 1.05*  CALCIUM 8.5*   _____________  Dg Chest 2 View  Result Date: 04/13/2018 CLINICAL DATA:  Fever,abdomen pain EXAM: CHEST - 2 VIEW COMPARISON:  04/07/2018 FINDINGS: Mild interstitial edema or infiltrates involving primarily the bases. Interval improvement in central pulmonary vascular congestion. No confluent airspace disease. Stable cardiomegaly. No effusion. Visualized bones unremarkable. IMPRESSION: 1. Improving central pulmonary vascular congestion with residual interstitial edema bilaterally. 2. Stable cardiomegaly.  Electronically Signed   By: Lucrezia Europe M.D.   On: 04/13/2018 14:53   Dg Chest 2 View  Result Date: 04/07/2018 CLINICAL DATA:  Recent cardioversion for atrial fibrillation. Chest pain and shortness of breath. EXAM: CHEST - 2 VIEW COMPARISON:  12/23/2017 FINDINGS: Cardiac silhouette is markedly enlarged consistent with cardiomegaly and/or pericardial fluid. There is pulmonary venous hypertension with interstitial edema. No effusions. No focal pulmonary finding. No acute bone finding. IMPRESSION: Enlarged cardiac silhouette consistent with cardiomegaly and/or pericardial effusion. Pulmonary venous hypertension with interstitial pulmonary edema. Electronically Signed   By: Nelson Chimes M.D.   On: 04/07/2018 16:10   Disposition   Pt is being discharged home today in good condition.  Follow-up Plans & Appointments    Follow-up Information    [facility]. Go on 04/19/2018.   Specialty:  Cardiology Why:  for Blood work check for kidney function  Contact information: [facility] [place] [facility] [place], [place], [place]. Go on 05/03/2018.  Specialties:  Physician Assistant, Cardiology, Radiology Why:  @3pm  for hosptial follow up  Contact information: [address] [place] [address] [phone]        [facility]. Go on 04/27/2018.   Specialty:  Cardiology Why:  parking code is 1400. Please call for direction Please arrive 15 minutes early for @2pm  appointment   Contact information: [address] [id number] [place] [phone]         Discharge Instructions    Amb Referral to Nutrition and Diabetic E   Complete by:  As directed    Diet - low sodium heart healthy   Complete by:  As directed    Discharge instructions   Complete by:  As directed    *Weigh yourself on the same scale at same time of day and keep a log. *Report weight  gain of > 3 lbs in 1 day or 5 lbs over the course of a week and/or symptoms of excess fluid (shortness of breath, difficulty lying flat, swelling, poor appetite, abdominal fullness/bloating, etc) to your doctor immediately. *Avoid foods that are high in sodium (processed, pre-packaged/canned goods, fast foods, etc). *Please attend all scheduled and reccommended follow up appointments   Increase activity slowly   Complete by:  As directed       Discharge Medications   Allergies as of 04/15/2018      Reactions   Acetaminophen Other (See Comments)   Seizure-like "fits" as a child   Caffeine    Tense, anxiety, increased urination   [place] [dapagliflozin] Other (See Comments)   Hallucinations, drop in blood sugar   Lisinopril Rash   Rash with lisinopril; but fosinopril is ok per patient      Medication List    STOP taking these medications   amLODipine 10 MG tablet Commonly known as:  NORVASC     TAKE these medications   carvedilol 25 MG tablet Commonly known as:  COREG Take 1 tablet (25 mg total) by mouth 2 (two) times daily.   diltiazem 180 MG 24 hr capsule Commonly known as:  CARDIZEM CD Take 1 capsule (180 mg total) by mouth daily.   ferrous sulfate 325 (65 FE) MG tablet Take 1 tablet (325 mg total) by mouth 2 (two) times daily.   furosemide 80 MG tablet Commonly known as:  LASIX Take 1 tablet (80 mg total) by mouth 2 (two) times daily. What changed:  when to take this   glipiZIDE 10 MG 24 hr tablet Commonly known as:  GLUCOTROL XL Take 1 tablet (10 mg total) by mouth daily with breakfast.   glucose blood test strip Use as instructed   losartan 100 MG tablet Commonly known as:  COZAAR Take 1 tablet (100 mg total) by mouth daily.   metFORMIN 1000 MG tablet Commonly known as:  GLUCOPHAGE Take 1 tablet (1,000 mg total) by mouth 2 (two) times daily with a meal.   ONE-A-DAY WOMENS PO Take 1 tablet by mouth daily.   oxybutynin 15 MG 24 hr tablet Commonly  known as:  DITROPAN XL Take 1 tablet (15 mg total) by mouth at bedtime.   pantoprazole 40 MG tablet Commonly known as:  PROTONIX Take 1 tablet (40 mg total) by mouth daily at 12 noon. Start taking on:  04/16/2018   potassium chloride SA 20 MEQ tablet Commonly known as:  K-DUR,KLOR-CON Take 1 tablet (20 mEq total) by mouth 2 (two) times daily. What changed:  medication strength  how much to take   risperiDONE 2 MG tablet Commonly known as:  RISPERDAL TAKE 2 TABLETS BY MOUTH DAILY. What changed:    how much to take  how to take this  when to take this   rivaroxaban 20 MG Tabs tablet Commonly known as:  XARELTO Take 1 tablet (20 mg total) by mouth daily with supper.   VENTOLIN HFA 108 (90 Base) MCG/ACT inhaler Generic drug:  albuterol INHALE 1 TO 2 PUFFS EVERY 6 HOURS AS NEEDED FOR WHEEZING/ SHORTNESS OF BREATH        Acute coronary syndrome (MI, NSTEMI, STEMI, etc) this admission?: No.    Outstanding Labs/Studies   BEMT on Tuesday 7/23  Duration of Discharge Encounter   Greater than 30 minutes including physician time.  Jarrett Soho, PA 04/15/2018, 3:06 PM

## 2018-04-15 NOTE — Progress Notes (Signed)
Ms Sava's transportation for discharge arrived after 5pm.  She stated the [facility] office is closed until Monday morning and they cannot afford a 30 day supply of her medications and need medications from the clinic.  They requested if [facility] can give doses for Saturday and Sunday for Cardizem,. Protonix, and Potassium.  Main pharmacy informed and they instructed me to contact Case Management to get approval for Indigent medications before our pharmacy can fill meds for the weekend.  Charge Nurse contacting Case Management for approval and Cardiology APP contacted for prescriptions for weekend meds.  Patient and her support person updated on plan of care.

## 2018-04-16 LAB — GLUCOSE, CAPILLARY
Glucose-Capillary: 135 mg/dL — ABNORMAL HIGH (ref 70–99)
Glucose-Capillary: 165 mg/dL — ABNORMAL HIGH (ref 70–99)

## 2018-04-16 MED ORDER — FUROSEMIDE 40 MG PO TABS: 40.0000 mg | ORAL_TABLET | Freq: Two times a day (BID) | ORAL | 6 refills | Status: DC

## 2018-04-16 NOTE — Progress Notes (Signed)
Discharge instructions given to patient, patient is waiting for her mother to pick her up.

## 2018-04-16 NOTE — Progress Notes (Signed)
Progress Note  Patient Name: [name] Date of Encounter: 04/16/2018  Primary Cardiologist: [name], MD   Subjective  Had an episode of atypical chest pain overnight while preparing for discharge. Sharp, pinching, nonradiating. No associated nausea/vomiting/shortness of breath. Mildly tender to palpation. Lasted overnight, but when I spoke with her this morning the pain was resolved. ECG was unchanged. She feels well this AM and is looking forward to going home.  Inpatient Medications    Scheduled Meds: . carvedilol  25 mg Oral BID  . diltiazem  180 mg Oral Daily  . ferrous sulfate  325 mg Oral BID  . furosemide  40 mg Oral BID  . glipiZIDE  10 mg Oral Q breakfast  . insulin aspart  0-15 Units Subcutaneous TID WC  . insulin aspart  0-5 Units Subcutaneous QHS  . losartan  100 mg Oral Daily  . metFORMIN  1,000 mg Oral BID WC  . oxybutynin  15 mg Oral QHS  . pantoprazole  40 mg Oral Q1200  . potassium chloride  20 mEq Oral BID  . risperiDONE  4 mg Oral QHS  . rivaroxaban  20 mg Oral Q supper  . sodium chloride flush  3 mL Intravenous Q12H   Continuous Infusions: . sodium chloride     PRN Meds: sodium chloride, acetaminophen, albuterol, ALPRAZolam, antiseptic oral rinse, nitroGLYCERIN, ondansetron (ZOFRAN) IV, sodium chloride flush, zolpidem   Vital Signs    Vitals:   04/15/18 2127 04/15/18 2137 04/15/18 2143 04/16/18 0643  BP: 114/80 113/67 132/84 114/80  Pulse: 88  83 87  Resp:    18  Temp: 98.4 F (36.9 C)   97.8 F (36.6 C)  TempSrc: Oral   Oral  SpO2: 99% 100%  98%  Weight:    (!) 348 lb 12.8 oz (158.2 kg)  Height:        Intake/Output Summary (Last 24 hours) at 04/16/2018 1005 Last data filed at 04/16/2018 0646 Gross per 24 hour  Intake 840 ml  Output 2100 ml  Net -1260 ml   Filed Weights   04/14/18 0520 04/15/18 0400 04/16/18 0643  Weight: (!) 348 lb 11.2 oz (158.2 kg) (!) 347 lb 1.6 oz (157.4 kg) (!) 348 lb 12.8 oz (158.2 kg)    Telemetry     Atrial fib   Rates 70s to 90s     - Personally Reviewed  ECG   ECG from last evening: atrial fibrillation, unchanged. Personally reviewed.  Physical Exam  Morbidly obese 52 yo in NAD   GEN: No acute distress.   Mouth   Moist mucous membranes, no lesions Neck: Neck is full , no JVD Cardiac: Irreg irreg   no murmurs, rubs, or gallops.  Respiratory: Mildly coarse bilaterally without rales.  GI: Soft, nontender, non-distended  YY:TKPTW bilateral lower extremity edema; No deformity. Neuro:  Nonfocal  Psych: Normal affect   Labs    Chemistry Recent Labs  Lab 04/11/18 0542 04/12/18 0630 04/14/18 0916  NA 141 139 137  K 3.6 3.6 3.5  CL 103 100 98  CO2 30 28 29   GLUCOSE 118* 129* 260*  BUN 15 15 18   CREATININE 0.70 0.73 1.05*  CALCIUM 9.0 8.9 8.5*  GFRNONAA >60 >60 >60  GFRAA >60 >60 >60  ANIONGAP 8 11 10      Hematology Recent Labs  Lab 04/13/18 1250  WBC 9.3  RBC 4.43  HGB 11.9*  HCT 38.7  MCV 87.4  MCH 26.9  MCHC 30.7  RDW 14.3  PLT 235    Cardiac Enzymes No results for input(s): TROPONINI in the last 168 hours.  No results for input(s): TROPIPOC in the last 168 hours.   BNPNo results for input(s): BNP, PROBNP in the last 168 hours.   DDimer No results for input(s): DDIMER in the last 168 hours.   Radiology    No results found.  Cardiac Studies  Echo 04/08/18 personally reviewed Left ventricle: The cavity size was normal. Wall thickness was   increased in a pattern of mild LVH. Systolic function was normal.   The estimated ejection fraction was in the range of 55% to 60%. - Mitral valve: There was mild regurgitation. - Left atrium: The atrium was mildly dilated. - Right atrium: The atrium was mildly dilated. - Tricuspid valve: There was moderate regurgitation. - Pulmonary arteries: PA peak pressure: 41 mm Hg (S). - Pericardium, extracardiac: A trivial pericardial effusion was   identified.  Patient Profile     52 y.o. female morbid obesity,  persistent atrial fibrillation, chronic diastolic congestive heart failure, needed with worsening shortness of breath and volume overload. Appears euvolemic today.  Assessment & Plan    1.  Atrial fibrillation: rate controlled on  Carvedilol and diltiazem. Continue current regimen including anticoagulation with rivaroxaban.   2   CHF  Acute on chronic diastolic CHF Volume is improved.  -Plan for discharge on 40 mg oral twice daily. -written for potassium supplementation on discharge. -BMET on 7/23 -F/U in clinic  In 2 to 3 wks  -has been educated on diet and fluid restriction. Per nursing, when she reaches her restriction she drinks water from her room sink. Concern that this will be difficult for her at home, will need continued education as outpatient.   3   Sleep apnea: will need to use CPAP after discharge.  4  Chest pain: atypical, pinching, no associated symptoms, lasted overnight and now resolved. ECG unremarkable.  OK to D/C home  For questions or updates, please contact [place] Please consult [email or web address] for contact info under Cardiology/STEMI.   Signed, Buford Dresser, MD  04/16/2018, 10:05 AM

## 2018-04-16 NOTE — Progress Notes (Signed)
Patient was in room with mother @ bedside and waiting to be discharged home after receipt of meds from the pharmacy. Approximately 2125, patient started to C/O C/P rating 7/10. EKG was done, O2 @ 4L as well as V/S's were closely monitered. Nitro SL was adminstered twice per protocol with good effect. Dr. Paticia Stack was notified and discharge was prosponed until 04/16/18 as patient is being observed tonight for any further deviations.

## 2018-04-16 NOTE — Discharge Summary (Signed)
Discharge Summary    Patient ID: [name],  MRN: [id number], DOB/AGE: March 31, 1966 52 y.o.  Admit date: 04/07/2018 Discharge date: 04/16/2018  Primary Care Provider: [name] Primary Cardiologist: [name], MD  Discharge Diagnoses    Principal Problem:   Acute on chronic diastolic congestive heart failure [facility]) Active Problems:   Essential hypertension   Acute on chronic diastolic CHF (congestive heart failure) ([place])   Uncontrolled type 2 diabetes mellitus with hyperglycemia (HCC)   Persistent atrial fibrillation (HCC)   Allergies Allergies  Allergen Reactions  . Acetaminophen Other (See Comments)    Seizure-like "fits" as a child  . Caffeine     Tense, anxiety, increased urination  . [place] [Dapagliflozin] Other (See Comments)    Hallucinations, drop in blood sugar  . Lisinopril Rash    Rash with lisinopril; but fosinopril is ok per patient    Diagnostic Studies/Procedures    ECHO 04/08/18 Study Conclusions  - Left ventricle: The cavity size was normal. Wall thickness was   increased in a pattern of mild LVH. Systolic function was normal.   The estimated ejection fraction was in the range of 55% to 60%. - Mitral valve: There was mild regurgitation. - Left atrium: The atrium was mildly dilated. - Right atrium: The atrium was mildly dilated. - Tricuspid valve: There was moderate regurgitation. - Pulmonary arteries: PA peak pressure: 41 mm Hg (S). - Pericardium, extracardiac: A trivial pericardial effusion was   identified.  _____________   History of Present Illness     52 yo female with hx of diastolic CHF, persistent a fib, HTN, obesity, Myovue with nl perfusion in 2017, EF 73%, SVT, chronic intermittent chest pain, CHA2DS2-VASc=3,  on Xarelto, OSA mediastinal adenopathy, basilar groundglass infiltrates, and prior Mycobacterium avium infection.   Pt presented to ER 04/07/18 with blood in her mouth, she also reported chest pain and  increased pain with a deep breath.  Also with wt increase.    She had just had DCCV attempt 04/05/18 and remained in a fib. She had 3 shocks that were unsuccessful.  She was referred to a fib clinic but not yet seen.    Admitted with acute on chronic  HF, for diuresis, eval of blood in her mouth and eval chest pain.  We did continue xarelto.   Hospital Course     Consultants: ENT Dr. Janace Hoard.    Pt was diuresed neg 9,686, and wt remained stable at 348.   Changed to po on 04/15/18.    Her oral bleeding has stopped, she did see Dr. Janace Hoard ENT he felt it was most likely from GI tract.     She did have mild fever with stay WBC normal, ua neg, CXR improved and only mild upper congestion  Dr. Harrington Challenger recommends she wear her CPAP for her sleep apnea.  .   Pt needs meds for sat and Sunday before she leaves.     Echo was stable.   Pt seen today by Dr. Harrell Gave will discharge on lasix 40 BID, along with K+.  Pt does drink increased liquids will have her on fluid restriction.   _____________  Discharge Vitals Blood pressure 114/80, pulse 87, temperature 97.8 F (36.6 C), temperature source Oral, resp. rate 18, height 5\' 7"  (1.702 m), weight (!) 348 lb 12.8 oz (158.2 kg), SpO2 98 %.  Filed Weights   04/14/18 0520 04/15/18 0400 04/16/18 0643  Weight: (!) 348 lb 11.2 oz (158.2 kg) (!) 347 lb 1.6  oz (157.4 kg) (!) 348 lb 12.8 oz (158.2 kg)    Labs & Radiologic Studies    CBC Recent Labs    04/13/18 1250  WBC 9.3  HGB 11.9*  HCT 38.7  MCV 87.4  PLT 196   Basic Metabolic Panel Recent Labs    04/14/18 0916  NA 137  K 3.5  CL 98  CO2 29  GLUCOSE 260*  BUN 18  CREATININE 1.05*  CALCIUM 8.5*   Liver Function Tests No results for input(s): AST, ALT, ALKPHOS, BILITOT, PROT, ALBUMIN in the last 72 hours. No results for input(s): LIPASE, AMYLASE in the last 72 hours. Cardiac Enzymes No results for input(s): CKTOTAL, CKMB, CKMBINDEX, TROPONINI in the last 72 hours. BNP Invalid  input(s): POCBNP D-Dimer No results for input(s): DDIMER in the last 72 hours. Hemoglobin A1C No results for input(s): HGBA1C in the last 72 hours. Fasting Lipid Panel No results for input(s): CHOL, HDL, LDLCALC, TRIG, CHOLHDL, LDLDIRECT in the last 72 hours. Thyroid Function Tests No results for input(s): TSH, T4TOTAL, T3FREE, THYROIDAB in the last 72 hours.  Invalid input(s): FREET3 _____________  Dg Chest 2 View  Result Date: 04/13/2018 CLINICAL DATA:  Fever,abdomen pain EXAM: CHEST - 2 VIEW COMPARISON:  04/07/2018 FINDINGS: Mild interstitial edema or infiltrates involving primarily the bases. Interval improvement in central pulmonary vascular congestion. No confluent airspace disease. Stable cardiomegaly. No effusion. Visualized bones unremarkable. IMPRESSION: 1. Improving central pulmonary vascular congestion with residual interstitial edema bilaterally. 2. Stable cardiomegaly. Electronically Signed   By: Lucrezia Europe M.D.   On: 04/13/2018 14:53   Dg Chest 2 View  Result Date: 04/07/2018 CLINICAL DATA:  Recent cardioversion for atrial fibrillation. Chest pain and shortness of breath. EXAM: CHEST - 2 VIEW COMPARISON:  12/23/2017 FINDINGS: Cardiac silhouette is markedly enlarged consistent with cardiomegaly and/or pericardial fluid. There is pulmonary venous hypertension with interstitial edema. No effusions. No focal pulmonary finding. No acute bone finding. IMPRESSION: Enlarged cardiac silhouette consistent with cardiomegaly and/or pericardial effusion. Pulmonary venous hypertension with interstitial pulmonary edema. Electronically Signed   By: Nelson Chimes M.D.   On: 04/07/2018 16:10   Disposition   Pt is being discharged home today in good condition.  Follow-up Plans & Appointments    Follow-up Information    [facility]. Go on 04/19/2018.   Specialty:  Cardiology Why:  for Blood work check for kidney function  Contact information: [place] [address] [place], [place], [place]. Go on 05/03/2018.   Specialties:  Physician Assistant, Cardiology, Radiology Why:  @3pm  for hosptial follow up  Contact information: [address] [place] [address] [phone]        [facility]. Go on 04/27/2018.   Specialty:  Cardiology Why:  parking code is 1400. Please call for direction Please arrive 15 minutes early for @2pm  appointment   Contact information: [address] [id number] [place] [phone]         Discharge Instructions    Amb Referral to Nutrition and Diabetic E   Complete by:  As directed    Diet - low sodium heart healthy   Complete by:  As directed    Discharge instructions   Complete by:  As directed    *Weigh yourself on the same scale at same time of day and keep a log. *Report weight gain of > 3 lbs in 1 day  or 5 lbs over the course of a week and/or symptoms of excess fluid (shortness of breath, difficulty lying flat, swelling, poor appetite, abdominal fullness/bloating, etc) to your doctor immediately. *Avoid foods that are high in sodium (processed, pre-packaged/canned goods, fast foods, etc). *Please attend all scheduled and reccommended follow up appointments   Increase activity slowly   Complete by:  As directed       Discharge Medications   Allergies as of 04/16/2018      Reactions   Acetaminophen Other (See Comments)   Seizure-like "fits" as a child   Caffeine    Tense, anxiety, increased urination   [place] [dapagliflozin] Other (See Comments)   Hallucinations, drop in blood sugar   Lisinopril Rash   Rash with lisinopril; but fosinopril is ok per patient      Medication List    STOP taking these medications   amLODipine 10 MG tablet Commonly known as:  NORVASC     TAKE these medications   carvedilol 25 MG tablet Commonly known as:  COREG Take 1 tablet (25  mg total) by mouth 2 (two) times daily.   diltiazem 180 MG 24 hr capsule Commonly known as:  CARDIZEM CD Take 1 capsule (180 mg total) by mouth daily.   ferrous sulfate 325 (65 FE) MG tablet Take 1 tablet (325 mg total) by mouth 2 (two) times daily.   furosemide 40 MG tablet Commonly known as:  LASIX Take 1 tablet (40 mg total) by mouth 2 (two) times daily. What changed:    medication strength  how much to take  when to take this   glipiZIDE 10 MG 24 hr tablet Commonly known as:  GLUCOTROL XL Take 1 tablet (10 mg total) by mouth daily with breakfast.   glucose blood test strip Use as instructed   losartan 100 MG tablet Commonly known as:  COZAAR Take 1 tablet (100 mg total) by mouth daily.   metFORMIN 1000 MG tablet Commonly known as:  GLUCOPHAGE Take 1 tablet (1,000 mg total) by mouth 2 (two) times daily with a meal.   ONE-A-DAY WOMENS PO Take 1 tablet by mouth daily.   oxybutynin 15 MG 24 hr tablet Commonly known as:  DITROPAN XL Take 1 tablet (15 mg total) by mouth at bedtime.   pantoprazole 40 MG tablet Commonly known as:  PROTONIX Take 1 tablet (40 mg total) by mouth daily at 12 noon.   potassium chloride SA 20 MEQ tablet Commonly known as:  K-DUR,KLOR-CON Take 1 tablet (20 mEq total) by mouth 2 (two) times daily. What changed:    medication strength  how much to take   risperiDONE 2 MG tablet Commonly known as:  RISPERDAL TAKE 2 TABLETS BY MOUTH DAILY. What changed:    how much to take  how to take this  when to take this   rivaroxaban 20 MG Tabs tablet Commonly known as:  XARELTO Take 1 tablet (20 mg total) by mouth daily with supper.   VENTOLIN HFA 108 (90 Base) MCG/ACT inhaler Generic drug:  albuterol INHALE 1 TO 2 PUFFS EVERY 6 HOURS AS NEEDED FOR WHEEZING/ SHORTNESS OF BREATH        Acute coronary syndrome (MI, NSTEMI, STEMI, etc) this admission?: No.    Outstanding Labs/Studies   BMP  Duration of Discharge Encounter    Greater than 30 minutes including physician time.  Signed, Cecilie Kicks, NP 04/16/2018, 10:35 AM

## 2018-04-17 ENCOUNTER — Other Ambulatory Visit: Payer: Self-pay | Admitting: Nurse Practitioner

## 2018-04-17 DIAGNOSIS — G4733 Obstructive sleep apnea (adult) (pediatric): Secondary | ICD-10-CM

## 2018-04-17 NOTE — Telephone Encounter (Signed)
Referral has been placed. 

## 2018-04-18 ENCOUNTER — Other Ambulatory Visit: Payer: Self-pay | Admitting: Internal Medicine

## 2018-04-18 ENCOUNTER — Other Ambulatory Visit: Payer: Self-pay | Admitting: Cardiology

## 2018-04-18 DIAGNOSIS — I10 Essential (primary) hypertension: Secondary | ICD-10-CM

## 2018-04-18 DIAGNOSIS — I5032 Chronic diastolic (congestive) heart failure: Secondary | ICD-10-CM

## 2018-04-18 DIAGNOSIS — Z794 Long term (current) use of insulin: Secondary | ICD-10-CM

## 2018-04-18 DIAGNOSIS — E119 Type 2 diabetes mellitus without complications: Secondary | ICD-10-CM

## 2018-04-18 LAB — CULTURE, BLOOD (ROUTINE X 2)
CULTURE: NO GROWTH
Special Requests: ADEQUATE

## 2018-04-18 MED FILL — ?CARVEDILOL 25 MG TABLET: 25 | 30 days supply | Qty: 60 | Fill #0

## 2018-04-18 MED FILL — LOSARTAN POTASSIUM 100 MG T: 100 | 30 days supply | Qty: 30 | Fill #0

## 2018-04-18 MED FILL — ?METFORMIN HCL 1,000 MG TAB: 1000 | 30 days supply | Qty: 60 | Fill #0

## 2018-04-18 MED FILL — risperiDONE 2 MG TABS: 2 | 30 days supply | Qty: 60 | Fill #3

## 2018-04-18 MED FILL — FERROUS SULFATE 325 MG TAB: 325 (65 FE) | 30 days supply | Qty: 60 | Fill #0

## 2018-04-18 MED FILL — ?FUROSEMIDE 40 MG TABLET: 40 | 30 days supply | Qty: 60 | Fill #0

## 2018-04-18 MED FILL — glipiZIDE XL 10 MG TB24: 10 | 30 days supply | Qty: 30 | Fill #0

## 2018-04-18 MED FILL — XARELTO 20 MG TABLET: 20 | 30 days supply | Qty: 30 | Fill #1

## 2018-04-20 ENCOUNTER — Other Ambulatory Visit: Payer: Self-pay

## 2018-04-20 DIAGNOSIS — N179 Acute kidney failure, unspecified: Secondary | ICD-10-CM | POA: Diagnosis not present

## 2018-04-20 DIAGNOSIS — I5033 Acute on chronic diastolic (congestive) heart failure: Secondary | ICD-10-CM

## 2018-04-21 LAB — BASIC METABOLIC PANEL
BUN/Creatinine Ratio: 15 (ref 9–23)
BUN: 12 mg/dL (ref 6–24)
CHLORIDE: 98 mmol/L (ref 96–106)
CO2: 25 mmol/L (ref 20–29)
Calcium: 8.7 mg/dL (ref 8.7–10.2)
Creatinine, Ser: 0.8 mg/dL (ref 0.57–1.00)
GFR calc Af Amer: 99 mL/min/{1.73_m2} (ref >59)
GFR calc non Af Amer: 86 mL/min/{1.73_m2} (ref >59)
GLUCOSE: 209 mg/dL — AB (ref 65–99)
POTASSIUM: 4 mmol/L (ref 3.5–5.2)
SODIUM: 138 mmol/L (ref 134–144)

## 2018-04-25 ENCOUNTER — Telehealth: Payer: Self-pay | Admitting: Physician Assistant

## 2018-04-25 NOTE — Telephone Encounter (Signed)
New Message ° ° °Pt returning call for nurse °

## 2018-04-25 NOTE — Progress Notes (Signed)
Pt has been made aware of normal result and verbalized understanding.  jw 04/25/18

## 2018-04-27 ENCOUNTER — Ambulatory Visit (HOSPITAL_COMMUNITY)
Admission: RE | Admit: 2018-04-27 | Discharge: 2018-04-27 | Disposition: A | Discharge status: Home / Self Care | Payer: Medicaid Other | Source: Ambulatory Visit | Attending: Nurse Practitioner | Admitting: Nurse Practitioner

## 2018-04-27 ENCOUNTER — Encounter (HOSPITAL_COMMUNITY): Payer: Self-pay | Admitting: Nurse Practitioner

## 2018-04-27 VITALS — BP 146/92 | HR 86 | Ht 67.0 in | Wt 335.2 lb

## 2018-04-27 DIAGNOSIS — F319 Bipolar disorder, unspecified: Secondary | ICD-10-CM | POA: Insufficient documentation

## 2018-04-27 DIAGNOSIS — G4733 Obstructive sleep apnea (adult) (pediatric): Secondary | ICD-10-CM

## 2018-04-27 DIAGNOSIS — I5033 Acute on chronic diastolic (congestive) heart failure: Secondary | ICD-10-CM | POA: Insufficient documentation

## 2018-04-27 DIAGNOSIS — I110 Hypertensive heart disease with heart failure: Secondary | ICD-10-CM | POA: Insufficient documentation

## 2018-04-27 DIAGNOSIS — Z7901 Long term (current) use of anticoagulants: Secondary | ICD-10-CM | POA: Insufficient documentation

## 2018-04-27 DIAGNOSIS — I272 Pulmonary hypertension, unspecified: Secondary | ICD-10-CM | POA: Insufficient documentation

## 2018-04-27 DIAGNOSIS — I481 Persistent atrial fibrillation: Secondary | ICD-10-CM | POA: Diagnosis not present

## 2018-04-27 DIAGNOSIS — Z888 Allergy status to other drugs, medicaments and biological substances status: Secondary | ICD-10-CM | POA: Diagnosis not present

## 2018-04-27 DIAGNOSIS — I4819 Other persistent atrial fibrillation: Secondary | ICD-10-CM

## 2018-04-27 DIAGNOSIS — Z794 Long term (current) use of insulin: Secondary | ICD-10-CM | POA: Diagnosis not present

## 2018-04-27 DIAGNOSIS — I471 Supraventricular tachycardia: Secondary | ICD-10-CM | POA: Insufficient documentation

## 2018-04-27 DIAGNOSIS — Z6841 Body Mass Index (BMI) 40.0 and over, adult: Secondary | ICD-10-CM | POA: Diagnosis not present

## 2018-04-27 DIAGNOSIS — E1165 Type 2 diabetes mellitus with hyperglycemia: Secondary | ICD-10-CM | POA: Diagnosis not present

## 2018-04-27 DIAGNOSIS — Z79899 Other long term (current) drug therapy: Secondary | ICD-10-CM | POA: Diagnosis not present

## 2018-04-28 NOTE — Progress Notes (Signed)
Primary Care Physician: [name], NP Referring Physician: [facility] f/u   [name] is a 52 y.o. female with a h/o morbid obesity, untreated sleep apnea,chronic diastolic HF, HTN, that was recently in the hospital for acute on chronic HF, uncontrolled type 2 DM, persistent afib. Prior to hospitalization, pt had unsuccessful cardioversion, after shocked x 3, in the afib clinic for evaluation  Pt continues in afib. She has sleep apnea for which she has not used cpap for years and probably will need a repeat study as she has had weight gain since the initial study. The mother is with her and reports that the daughter has been non complaint with her drugs, but since hospitalization, is trying to be more careful to take correctly. She is unaware of afib today.  Today, she denies symptoms of palpitations, chest pain, shortness of breath, orthopnea, PND, lower extremity edema, dizziness, presyncope, syncope, or neurologic sequela. The patient is tolerating medications without difficulties and is otherwise without complaint today.   Past Medical History:  Diagnosis Date  . Acute diastolic CHF (congestive heart failure) ([place]) 10/03/2015  . Acute on chronic diastolic CHF (congestive heart failure) ([place]) 08/03/2017  . Acute on chronic diastolic congestive heart failure ([place]) 11/02/2013  . Acute on chronic diastolic heart failure ([place]) 11/13/2015  . Benign essential HTN 11/28/2013  . Bipolar disease, chronic ([place])   . Chest pain    a. 2012 Myoview: EF 63%, no isch/infarct;  b. 04/2016 Lexiscan MV: EF 73%, no ischemia/infarct-->Low risk.  . Chronic diastolic CHF (congestive heart failure) ([place])    a. 2015 Echo: EF 55-60%, Gr2 DD;  b. 09/2015 Echo: EF 60-65%, no rwma, mod dil LA, PASP 49mmHg.  Marland Kitchen Chronic diastolic heart failure ([place]) 07/23/2011  . Cor pulmonale (chronic) ([facility])   . Essential hypertension 03/07/2015  . History of thyrotoxicosis   . HTN (hypertension) 11/28/2013  . Hypertension   .  Hypertensive heart disease 10/18/2013  . Insulin dependent type 2 diabetes mellitus, uncontrolled ([place])   . Mediastinal adenopathy   . Morbid obesity due to excess calories ([place]) 02/19/2011  . Morbid obesity with BMI of 50.0-59.9, adult ([place])   . OSA (obstructive sleep apnea) 03/06/2011  . Persistent atrial fibrillation ([place]) 12/09/2017  . Pulmonary HTN, moderate to severe 11/03/2013  . Sinusitis, chronic 01/02/2015  . SVT (supraventricular tachycardia) ([place]) 12/06/2013  . Uncontrolled type 2 diabetes mellitus with hyperglycemia [facility])    Past Surgical History:  Procedure Laterality Date  . CARDIOVERSION N/A 04/05/2018   Procedure: CARDIOVERSION;  Surgeon: Lelon Perla, MD;  Location: [facility] END[facility]COPY;  Service: Cardiovascular;  Laterality: N/A;  . None      Current Outpatient Medications  Medication Sig Dispense Refill  . carvedilol (COREG) 25 MG tablet TAKE 1 TABLET BY MOUTH 2 TIMES DAILY. 180 tablet 3  . diltiazem (CARDIZEM CD) 180 MG 24 hr capsule Take 1 capsule (180 mg total) by mouth daily. 30 capsule 11  . ferrous sulfate 325 (65 FE) MG tablet Take 1 tablet (325 mg total) by mouth 2 (two) times daily. 60 tablet 3  . furosemide (LASIX) 40 MG tablet Take 1 tablet (40 mg total) by mouth 2 (two) times daily. 60 tablet 6  . GLIPIZIDE XL 10 MG 24 hr tablet TAKE 1 TABLET (10 MG TOTAL) BY MOUTH DAILY WITH BREAKFAST. 30 tablet 2  . glucose blood test strip Use as instructed 100 each 12  . losartan (COZAAR) 100 MG tablet TAKE 1 TABLET (100 MG TOTAL)  BY MOUTH DAILY. 30 tablet 2  . metFORMIN (GLUCOPHAGE) 1000 MG tablet TAKE 1 TABLET (1,000 MG TOTAL) BY MOUTH 2 (TWO) TIMES DAILY WITH A MEAL. 60 tablet 2  . Multiple Vitamins-Calcium (ONE-A-DAY WOMENS PO) Take 1 tablet by mouth daily.    Marland Kitchen oxybutynin (DITROPAN XL) 15 MG 24 hr tablet Take 1 tablet (15 mg total) by mouth at bedtime. 30 tablet 3  . pantoprazole (PROTONIX) 40 MG tablet Take 1 tablet (40 mg total) by mouth daily at 12 noon. 30 tablet 3  .  potassium chloride SA (K-DUR,KLOR-CON) 20 MEQ tablet Take 1 tablet (20 mEq total) by mouth 2 (two) times daily. 60 tablet 6  . risperiDONE (RISPERDAL) 2 MG tablet TAKE 2 TABLETS BY MOUTH DAILY. (Patient taking differently: Take 4 mg by mouth at bedtime) 60 tablet 3  . rivaroxaban (XARELTO) 20 MG TABS tablet Take 1 tablet (20 mg total) by mouth daily with supper. 30 tablet 6  . VENTOLIN HFA 108 (90 Base) MCG/ACT inhaler INHALE 1 TO 2 PUFFS EVERY 6 HOURS AS NEEDED FOR WHEEZING/ SHORTNESS OF BREATH 18 g 1   No current facility-administered medications for this encounter.     Allergies  Allergen Reactions  . Acetaminophen Other (See Comments)    Seizure-like "fits" as a child  . Caffeine     Tense, anxiety, increased urination  . [place] [Dapagliflozin] Other (See Comments)    Hallucinations, drop in blood sugar  . Lisinopril Rash    Rash with lisinopril; but fosinopril is ok per patient    Social History   Socioeconomic History  . Marital status: Single    Spouse name: Not on file  . Number of children: 0  . Years of education: 55  . Highest education level: Not on file  Occupational History  . Occupation: unemployed  Social Needs  . Financial resource strain: Not on file  . Food insecurity:    Worry: Not on file    Inability: Not on file  . Transportation needs:    Medical: Not on file    Non-medical: Not on file  Tobacco Use  . Smoking status: Never Smoker  . Smokeless tobacco: Never Used  Substance and Sexual Activity  . Alcohol use: No  . Drug use: No  . Sexual activity: Not Currently    Birth control/protection: None  Lifestyle  . Physical activity:    Days per week: Not on file    Minutes per session: Not on file  . Stress: Not on file  Relationships  . Social connections:    Talks on phone: Not on file    Gets together: Not on file    Attends religious service: Not on file    Active member of club or organization: Not on file    Attends meetings of clubs  or organizations: Not on file    Relationship status: Not on file  . Intimate partner violence:    Fear of current or ex partner: Not on file    Emotionally abused: Not on file    Physically abused: Not on file    Forced sexual activity: Not on file  Other Topics Concern  . 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 But ran out of money and is no longer attending school. (Note patient has bipolar disorder).      Born in  [place] but lived in [place] before coming back to [place].       Primary language is Arabic. Lives with mother and brother.    Family History  Problem Relation Age of Onset  . Heart failure Father   . Stroke Father   . Hypertension Mother   . Heart disease Maternal Grandfather     ROS- All systems are reviewed and negative except as per the HPI above  Physical Exam: Vitals:   04/27/18 1444  BP: (!) 146/92  Pulse: 86  SpO2: 96%  Weight: (!) 335 lb 3.2 oz (152 kg)  Height: 5\' 7"  (1.702 m)   Wt Readings from Last 3 Encounters:  04/27/18 (!) 335 lb 3.2 oz (152 kg)  04/16/18 (!) 348 lb 12.8 oz (158.2 kg)  04/05/18 (!) 346 lb (156.9 kg)    Labs: Lab Results  Component Value Date   NA 138 04/20/2018   K 4.0 04/20/2018   CL 98 04/20/2018   CO2 25 04/20/2018   GLUCOSE 209 (H) 04/20/2018   BUN 12 04/20/2018   CREATININE 0.80 04/20/2018   CALCIUM 8.7 04/20/2018   PHOS 4.2 02/05/2011   MG 1.6 08/24/2017   Lab Results  Component Value Date   INR 1.36 04/07/2018   Lab Results  Component Value Date   CHOL 173 08/06/2016   HDL 45 (L) 08/06/2016   LDLCALC 86 08/06/2016   TRIG 212 (H) 08/06/2016     GEN- The patient is well appearing, alert and oriented x 3 today.   Head- normocephalic, atraumatic Eyes-  Sclera clear, conjunctiva pink Ears- hearing intact Oropharynx- clear Neck- supple, no JVP Lymph- no cervical lymphadenopathy Lungs- Clear  to ausculation bilaterally, normal work of breathing Heart- irregular rate and rhythm, no murmurs, rubs or gallops, PMI not laterally displaced GI- soft, NT, ND, + BS Extremities- no clubbing, cyanosis, or edema MS- no significant deformity or atrophy Skin- no rash or lesion Psych- euthymic mood, full affect Neuro- strength and sensation are intact  EKG- Afib at 86 bpm, qrs int 80 ms, qtc 452 Echo-Study Conclusions  - Left ventricle: The cavity size was normal. Wall thickness was   increased in a pattern of mild LVH. Systolic function was normal.   The estimated ejection fraction was in the range of 55% to 60%. - Mitral valve: There was mild regurgitation. - Left atrium: The atrium was mildly dilated. - Right atrium: The atrium was mildly dilated. - Tricuspid valve: There was moderate regurgitation. - Pulmonary arteries: PA peak pressure: 41 mm Hg (S). - Pericardium, extracardiac: A trivial pericardial effusion was   identified.   Assessment and Plan: 1.Persisitent afib Failed cardioversion I discussed means of restoring sinus rhythm, but I fear her morbid obesity, lack of cpap treatment, and h/o non compliance  will be issues that will undermine our ability to restore SR.  I hesitate to use multaq or flecainide with her h/o of HF, she is on the young side for amiodarone and with ground glass opacities noted on previous CT's , best to avoid amiodarone as well for this reason I also have concerns with Tikosyn or sotalol with non compliance issues For now, will schedule repeat sleep study, pt is to work on weight loss and will see back in 6 weeks She is rate controlled today and will continue with BB/CCB at current doses Continue xarelto for a chadsvasc score of at least 4  2. HTN Mildly elevated  Avoid salt  3. Chronic diastolic HF Weight today  is 335 llb, down from recnt 348 lbs Continue furosemide at 40 mg bid Weight daily Avoid salt  F/u in 6 weeks  Butch Penny C. Venora Kautzman,  [facility] [address] [place], [place] [address] [phone]

## 2018-05-02 NOTE — Progress Notes (Signed)
Cardiology Office Note:    Date:  05/03/2018   ID:  [name], [name], 1967, MRN [id number]  PCP:  [name], NP  Cardiologist:  [name], MD   Referring MD: [name], NP   Chief Complaint  Patient presents with  . Hospitalization Follow-up    DOE    History of Present Illness:    [name] is a 52 y.o. female with a hx of chronic diastolic heart failure, hypertensive heart disease, moderate to severe pulmonary hypertension, HTN, and persistent atrial fibrillation. She has a normal myoview study in 2017. She is anticoagulated with xarelto for a CHA2DS2-VASc Score and unadjusted Ischemic Stroke Rate (% per year) is equal to 3.2 % stroke rate/year from a score of 3. She presented to [facility] 04/07/18 with blood in her mouth, chest pain worse with deep inspiration, and increase in her weight. She recently underwent DCCV on 04/05/18, but unfortunately was not cardioverted and remained in Afib after three shocks. She was diuresed and was overall net negative nearly 10L with a weight of 348 lbs. Echo was stable. She was discharged from the hospital on 04/16/18 after being treated for acute on chronic diastolic heart failure.   She returns to clinic today for follow up.  She appears visibly short of breath with speaking.  She is weighing daily and reports a weight loss of 11 to 12 pounds.  She reports chest pain with exertion that is relieved with rest.  Similar chest pain that she had prior to her 2017 Myoview study.  She states the chest pain comes less frequently but is more intense.  She refuses a repeat stress test.  She also states that she continues to have dyspnea on exertion.  She cannot walk to the mailbox or take the garbage out without getting short of breath. In addition to shortness of breath, she is experiencing chest pain.  Her resting heart rate today is near 100 and continues to be in A. fib.  She denies any bleeding problems on Xarelto. She was unable to get  assistance with home O2 at discharge from the hospital. She is refusing every suggestion that I make to improve her symptoms.   Past Medical History:  Diagnosis Date  . Acute diastolic CHF (congestive heart failure) ([place]) 10/03/2015  . Acute on chronic diastolic CHF (congestive heart failure) ([place]) 08/03/2017  . Acute on chronic diastolic congestive heart failure ([place]) 11/02/2013  . Acute on chronic diastolic heart failure ([place]) 11/13/2015  . Benign essential HTN 11/28/2013  . Bipolar disease, chronic ([place])   . Chest pain    a. 2012 Myoview: EF 63%, no isch/infarct;  b. 04/2016 Lexiscan MV: EF 73%, no ischemia/infarct-->Low risk.  . Chronic diastolic CHF (congestive heart failure) ([place])    a. 2015 Echo: EF 55-60%, Gr2 DD;  b. 09/2015 Echo: EF 60-65%, no rwma, mod dil LA, PASP 22mmHg.  Marland Kitchen Chronic diastolic heart failure ([place]) 10/[name]/2012  . Cor pulmonale (chronic) ([place])   . Essential hypertension 03/07/2015  . History of thyrotoxicosis   . HTN (hypertension) 11/28/2013  . Hypertension   . Hypertensive heart disease 10/18/2013  . Insulin dependent type 2 diabetes mellitus, uncontrolled ([place])   . Mediastinal adenopathy   . Morbid obesity due to excess calories ([place]) 02/19/2011  . Morbid obesity with BMI of 50.0-59.9, adult ([place])   . OSA (obstructive sleep apnea) 03/06/2011  . Persistent atrial fibrillation ([place]) 12/09/2017  . Pulmonary HTN, moderate to severe 11/03/2013  . Sinusitis,  chronic 01/02/2015  . SVT (supraventricular tachycardia) ([place]) 12/06/2013  . Uncontrolled type 2 diabetes mellitus with hyperglycemia [facility])     Past Surgical History:  Procedure Laterality Date  . CARDIOVERSION N/A 04/05/2018   Procedure: CARDIOVERSION;  Surgeon: Lelon Perla, MD;  Location: [facility] ENDOSCOPY;  Service: Cardiovascular;  Laterality: N/A;  . None      Current Medications: Current Meds  Medication Sig  . carvedilol (COREG) [name] MG tablet TAKE 1 TABLET BY MOUTH 2 TIMES DAILY.  . ferrous sulfate 325 (65 FE) MG  tablet Take 1 tablet (325 mg total) by mouth 2 (two) times daily.  . furosemide (LASIX) 40 MG tablet Take 1 tablet (40 mg total) by mouth 2 (two) times daily.  Marland Kitchen GLIPIZIDE XL 10 MG 24 hr tablet TAKE 1 TABLET (10 MG TOTAL) BY MOUTH DAILY WITH BREAKFAST.  Marland Kitchen glucose blood test strip Use as instructed  . losartan (COZAAR) 100 MG tablet TAKE 1 TABLET (100 MG TOTAL) BY MOUTH DAILY.  . metFORMIN (GLUCOPHAGE) 1000 MG tablet TAKE 1 TABLET (1,000 MG TOTAL) BY MOUTH 2 (TWO) TIMES DAILY WITH A MEAL.  Marland Kitchen oxybutynin (DITROPAN XL) 15 MG 24 hr tablet Take 1 tablet (15 mg total) by mouth at bedtime.  . pantoprazole (PROTONIX) 40 MG tablet Take 1 tablet (40 mg total) by mouth daily at 12 noon.  . potassium chloride SA (K-DUR,KLOR-CON) 20 MEQ tablet Take 1 tablet (20 mEq total) by mouth 2 (two) times daily.  . risperiDONE (RISPERDAL) 2 MG tablet TAKE 2 TABLETS BY MOUTH DAILY. (Patient taking differently: Take 4 mg by mouth at bedtime)  . VENTOLIN HFA 108 (90 Base) MCG/ACT inhaler INHALE 1 TO 2 PUFFS EVERY 6 HOURS AS NEEDED FOR WHEEZING/ SHORTNESS OF BREATH  . [DISCONTINUED] diltiazem (CARDIZEM CD) 180 MG 24 hr capsule Take 1 capsule (180 mg total) by mouth daily.     Allergies:   Acetaminophen; Caffeine; Farxiga [dapagliflozin]; and Lisinopril   Social History   Socioeconomic History  . Marital status: Single    Spouse name: Not on file  . Number of children: 0  . Years of education: 62  . Highest education level: Not on file  Occupational History  . Occupation: unemployed  Social Needs  . Financial resource strain: Not on file  . Food insecurity:    Worry: Not on file    Inability: Not on file  . Transportation needs:    Medical: Not on file    Non-medical: Not on file  Tobacco Use  . Smoking status: Never Smoker  . Smokeless tobacco: Never Used  Substance and Sexual Activity  . Alcohol use: No  . Drug use: No  . Sexual activity: Not Currently    Birth control/protection: None  Lifestyle  .  Physical activity:    Days per week: Not on file    Minutes per session: Not on file  . Stress: Not on file  Relationships  . Social connections:    Talks on phone: Not on file    Gets together: Not on file    Attends religious service: Not on file    Active member of club or organization: Not on file    Attends meetings of clubs or organizations: Not on file    Relationship status: Not on file  Other Topics Concern  . 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 Then was enrolled in a MPH  program at [employer]. But ran out of money and is no longer attending school. (Note patient has bipolar disorder).      Born in [place] but lived in [place] before coming back to [place].       Primary language is Arabic. Lives with mother and brother.     Family History: The patient's family history includes Heart disease in her maternal grandfather; Heart failure in her father; Hypertension in her mother; Stroke in her father.  ROS:   Please see the history of present illness.     All other systems reviewed and are negative.  EKGs/Labs/Other Studies Reviewed:    The following studies were reviewed today:  ECHO 04/08/18 Study Conclusions - Left ventricle: The cavity size was normal. Wall thickness was increased in a pattern of mild LVH. Systolic function was normal. The estimated ejection fraction was in the range of 55% to 60%. - Mitral valve: There was mild regurgitation. - Left atrium: The atrium was mildly dilated. - Right atrium: The atrium was mildly dilated. - Tricuspid valve: There was moderate regurgitation. - Pulmonary arteries: PA peak pressure: 41 mm Hg (S). - Pericardium, extracardiac: A trivial pericardial effusion was identified.    EKG:  EKG is ordered today.  The ekg ordered today demonstrates rate controlled A. fib with ventricular rate 99 bpm  Recent Labs: 08/24/2017: Magnesium 1.6 12/09/2017: TSH  2.910 12/23/2017: B Natriuretic Peptide 285.1 04/08/2018: ALT 14 04/13/2018: Hemoglobin 11.9; Platelets 235 04/20/2018: BUN 12; Creatinine, Ser 0.80; Potassium 4.0; Sodium 138  Recent Lipid Panel    Component Value Date/Time   CHOL 173 08/06/2016 1245   TRIG 212 (H) 08/06/2016 1245   HDL 45 (L) 08/06/2016 1245   CHOLHDL 3.8 08/06/2016 1245   VLDL 42 (H) 08/06/2016 1245   LDLCALC 86 08/06/2016 1245    Physical Exam:    VS:  BP (!) 144/92   Pulse 100   Ht 5\' 7"  (1.702 m)   Wt (!) 340 lb (154.2 kg)   SpO2 95%   BMI 53.[name] kg/m     Wt Readings from Last 3 Encounters:  05/03/18 (!) 340 lb (154.2 kg)  04/27/18 (!) 335 lb 3.2 oz (152 kg)  04/16/18 (!) 348 lb 12.8 oz (158.2 kg)     GEN: Well nourished, well developed in no acute distress HEENT: Normal NECK: No JVD; No carotid bruits LYMPHATICS: No lymphadenopathy CARDIAC: irregular rhythm, regular rate, no murmur RESPIRATORY:  Clear to auscultation without rales, wheezing or rhonchi, but diminished in bases ABDOMEN: Soft, non-tender, non-distended MUSCULOSKELETAL:  No edema; No deformity  SKIN: Warm and dry NEUROLOGIC:  Alert and oriented x 3 PSYCHIATRIC:  Normal affect   ASSESSMENT:    1. Persistent atrial fibrillation ([place])   2. Chronic diastolic heart failure (HCC)   3. Chest pain on exertion   4. Dyspnea on exertion   5. Hypertensive heart disease, unspecified whether heart failure present   6. Essential hypertension   7. Pulmonary HTN, moderate to severe    PLAN:    In order of problems listed above:  Persistent atrial fibrillation (HCC) EKG with rate controlled A. fib.  However per the patient's report, I suspect that she has rapid heart rate with mild exertion and that this is contributing to her chest pain.  She initially refused all suggestions I made to improve her symptomology.  Rhonda Barrett PA-C was able to come in and convince her to increase her Cardizem to 240 mg daily.  Chronic diastolic heart failure  (  [place]) She is doing well on her diuretic regimen.  Per her report, she has lost 11 to 12 pounds at home.  She continues to avoid sodium.  She is unable to exercise.  Chest pain on exertion Myoview in 2017 was low risk.  She is not a candidate for coronary CT given her A. Fib.  Given her chest pain on exertion that is relieved with rest, I recommended a repeat Myoview study.  She refuses to have this done at this time.  I suspect this chest pain could be related to her heart failure, and poorly controlled A. fib.  Dyspnea on exertion She was unable to receive assistance with supplemental oxygen at home following hospital discharge.  Discussed weight loss as a way to help with her dyspnea on exertion.  Hypertensive heart disease, unspecified whether heart failure present Essential hypertension Pressure is 144/92.  Increase Cardizem to 240 mg daily as above.  Pulmonary HTN, moderate to severe As above.  Follow-up with [name], PA-C in 4 to 6 weeks.  Medication Adjustments/Labs and Tests Ordered: Current medicines are reviewed at length with the patient today.  Concerns regarding medicines are outlined above.  No orders of the defined types were placed in this encounter.  Meds ordered this encounter  Medications  . diltiazem (CARDIZEM CD) 240 MG 24 hr capsule    Sig: Take 1 capsule (240 mg total) by mouth daily.    Dispense:  30 capsule    Refill:  6    Signed, Ledora Bottcher, [place]  05/03/2018 3:43 PM    [facility]

## 2018-05-03 ENCOUNTER — Telehealth: Payer: Self-pay | Admitting: *Deleted

## 2018-05-03 ENCOUNTER — Encounter: Payer: Self-pay | Admitting: Physician Assistant

## 2018-05-03 ENCOUNTER — Ambulatory Visit (INDEPENDENT_AMBULATORY_CARE_PROVIDER_SITE_OTHER): Payer: Self-pay | Admitting: Physician Assistant

## 2018-05-03 VITALS — BP 144/92 | HR 100 | Ht 67.0 in | Wt 340.0 lb

## 2018-05-03 DIAGNOSIS — I481 Persistent atrial fibrillation: Secondary | ICD-10-CM

## 2018-05-03 DIAGNOSIS — I272 Pulmonary hypertension, unspecified: Secondary | ICD-10-CM

## 2018-05-03 DIAGNOSIS — I119 Hypertensive heart disease without heart failure: Secondary | ICD-10-CM

## 2018-05-03 DIAGNOSIS — I10 Essential (primary) hypertension: Secondary | ICD-10-CM

## 2018-05-03 DIAGNOSIS — R0609 Other forms of dyspnea: Secondary | ICD-10-CM

## 2018-05-03 DIAGNOSIS — R079 Chest pain, unspecified: Secondary | ICD-10-CM

## 2018-05-03 DIAGNOSIS — I5032 Chronic diastolic (congestive) heart failure: Secondary | ICD-10-CM

## 2018-05-03 DIAGNOSIS — I4819 Other persistent atrial fibrillation: Secondary | ICD-10-CM

## 2018-05-03 MED ORDER — DILTIAZEM HCL ER COATED BEADS 240 MG PO CP24: 240.0000 mg | ORAL_CAPSULE | Freq: Every day | ORAL | 6 refills | Status: DC

## 2018-05-03 MED FILL — DILTIAZEM 24HR ER 240 MG CA: 240 | 30 days supply | Qty: 30 | Fill #0

## 2018-05-03 NOTE — Patient Instructions (Signed)
MEDICATION CHANGES  STOP DILTIAZEM CD 180 MG  START DILTIAZEM CD 240 MG  ONE TABLET DAILY     Your physician recommends that you schedule a follow-up appointment in 4-6 [place].    If you need a refill on your cardiac medications before your next appointment, please call your pharmacy.

## 2018-05-03 NOTE — Telephone Encounter (Signed)
Patient is scheduled for lab study on WED 828/19. Patient understands her sleep study will be done at [facility] sleep lab. Patient understands she will receive a sleep packet in a week or so. Patient understands to call if she does not receive the sleep packet in a timely manner.   Left detailed message on voicemail with date and time of her test and informed patient to call back to confirm or reschedule.

## 2018-05-03 NOTE — Telephone Encounter (Signed)
-----   Message from Lauralee Evener, [place] sent at 04/29/2018 12:22 PM EDT ----- Regarding: RE: precert Patient's insurance does not require a Pre cert as long as he uses [facility]. Ok to go ahead and schedule sleep study. ----- Message ----- From: Freada Bergeron, CMA Sent: 04/28/2018   2:37 PM To: [facility] Subject: precert                                          ----- Message ----- From: Iona Hansen, CMA Sent: 04/28/2018   1:42 PM To: Freada Bergeron, CMA Subject: RE: Sleep study order                          No, I apologize, I didnt ----- Message ----- From: Freada Bergeron, CMA Sent: 04/28/2018   1:30 PM To: Iona Hansen, CMA Subject: RE: Sleep study order                          Ok did you send the study to the precet pool first? If not I will send it. Gae Bon ----- Message ----- From: Iona Hansen, CMA Sent: 04/27/2018   4:31 PM To: Freada Bergeron, CMA Subject: Sleep study order                              Hey!  I put a sleep study order in for this patient.  If you could please contact her to schedule this, we would appreciate it    Estill Bamberg

## 2018-05-05 NOTE — Addendum Note (Signed)
Addended by: De Burrs on: 05/05/2018 09:15 AM   Modules accepted: Orders

## 2018-05-06 ENCOUNTER — Ambulatory Visit (INDEPENDENT_AMBULATORY_CARE_PROVIDER_SITE_OTHER): Payer: Self-pay | Admitting: Emergency Medicine

## 2018-05-06 ENCOUNTER — Encounter: Payer: Self-pay | Admitting: Emergency Medicine

## 2018-05-06 DIAGNOSIS — G4733 Obstructive sleep apnea (adult) (pediatric): Secondary | ICD-10-CM

## 2018-05-06 DIAGNOSIS — J9611 Chronic respiratory failure with hypoxia: Secondary | ICD-10-CM | POA: Insufficient documentation

## 2018-05-06 NOTE — Progress Notes (Signed)
Subjective:    Patient ID: [name], female    DOB: 07/14/1966, 52 y.o.   MRN: [id number]  Shortness of Breath  Pertinent negatives include no rhinorrhea.   ROV 07/20/17 --52 year old [place] woman with a history of an abnormal CT scan of the chest that we have been following with serial scans.  She has mediastinal lymphadenopathy and some scattered groundglass changes.  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  She also has obstructive sleep apnea.  In July we discussed restarting CPAP and she did not want to do so. She tells me that she has had more trouble over the last week she has had increased exertional dyspnea. Has orthopnea.  She has needed to miss her lasix because it is difficult for her to work when she takes it - needs to go to the bathroom often. She has had some associated chest tightness, again improved when takes her diuretics. She is concerned that her CBG's have been elevated, followed by Dr Doreene Burke. She feels an upper airway irritation, non-productive cough 1-2x a day.   Her quant gold was negative, ACE normal, HIV negative.   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  Also with obstructive sleep apnea although she has not been able to tolerate CPAP.  At her last visit we discussed better compliance with her furosemide, also discussed better compliance with CPAP.  Unfortunately she was in the emergency department again 51/7/61 with diastolic CHF.  She has been changed to Bumex - she believes that this is an improvement.she is taking more reliably. I performed a CT chest 11/8 > some scattered GGI, likely edema, without bronchiectasis or ILD.    Acute OV 05/06/18 --52 year old woman with history of scattered groundglass  changes on CT scan, mediastinal lymphadenopathy, Mycobacterium avium on sputum cultures.  Also with obstructive sleep apnea which is untreated. She has secondary PAH, multifactorial  - A fib, HTN, untreated OSA. Most recent CT chest was 12/23/2017 which I have reviewed, shows no evidence for pulmonary embolism, prominent main PA and evidence for increased RV pressures without any lymphadenopathy. She has a failed attempt to DCCV since I last saw her. She was found to have exertional desaturation at cardiology 05/03/18 and was sent here for eval of this.  She tells me emphatically that she will not accept home O2, even though we know she desats. She has exertional SOB. She snores, is willing to retry treatment of OSA. No wheeze, no cough.    Review of Systems  HENT: Negative for postnasal drip and rhinorrhea.   Respiratory: Negative for cough, chest tightness and shortness of breath.   Psychiatric/Behavioral: Positive for sleep disturbance.   As per HPI     Objective:   Physical Exam Vitals:   05/06/18 1443  BP: 130/70  Pulse: 87  SpO2: 92%  Weight: (!) 338 lb (153.3 kg)  Height: 5\' 7"  (1.702 m)    Gen: Pleasant, obese woman, in no distress,  normal affect  ENT: No lesions,  mouth clear,  oropharynx clear, no postnasal drip  Neck: No JVD, no stridor  Lungs: No use of accessory muscles, small breaths, no crackles or rhonchi  Cardiovascular: RRR, heart sounds normal, no murmur or gallops, trace peripheral edema  Musculoskeletal: No deformities, no cyanosis or clubbing  Neuro: alert,  non focal  Skin: Warm, no lesions or rashes      Assessment & Plan:  Chronic respiratory failure with hypoxia (HCC) Chronic hypoxemic respiratory failure that is been documented in the past and is confirmed on her recent visit to cardiology with exertion.  Multifactorial, due to OSA/OHS (untreated), secondary pulmonary hypertension.  She does not have any evidence for interstitial lung disease by CT.   No evidence of pulmonary embolism.  I discussed in detail with her today explained that she needs to have supplemental oxygen to prevent worsening of her PAH and thereby worsening respiratory failure.  She adamantly refuses to use supplement oxygen.  We will have to revisit  OSA (obstructive sleep apnea)- non compliant with C-pap She was noncompliant with CPAP but is now willing to retry it.  She will need a new sleep study and we will arrange for this.  Her prior CPAP pressure had been 10-11 cmH2O.  Baltazar Apo, MD, PhD 05/06/2018, 3:08 PM  Pulmonary and Critical Care [phone] or if no answer [phone]

## 2018-05-06 NOTE — Assessment & Plan Note (Signed)
Chronic hypoxemic respiratory failure that is been documented in the past and is confirmed on her recent visit to cardiology with exertion.  Multifactorial, due to OSA/OHS (untreated), secondary pulmonary hypertension.  She does not have any evidence for interstitial lung disease by CT.  No evidence of pulmonary embolism.  I discussed in detail with her today explained that she needs to have supplemental oxygen to prevent worsening of her PAH and thereby worsening respiratory failure.  She adamantly refuses to use supplement oxygen.  We will have to revisit

## 2018-05-06 NOTE — Assessment & Plan Note (Signed)
She was noncompliant with CPAP but is now willing to retry it.  She will need a new sleep study and we will arrange for this.  Her prior CPAP pressure had been 10-11 cmH2O.

## 2018-05-06 NOTE — Addendum Note (Signed)
Addended by: Desmond Dike C on: 05/06/2018 03:19 PM   Modules accepted: Orders

## 2018-05-06 NOTE — Patient Instructions (Signed)
We discussed your low oxygen levels while walking today.  You would benefit from being on oxygen with exertion.  Please consider this and we can discuss further in the future. We will arrange for a split-night sleep study and will try to initiate CPAP treatment depending on the results. Follow with Dr Lamonte Sakai in 2 months or sooner if you have any problems.

## 2018-05-16 MED FILL — ?METFORMIN HCL 1000MG TABS: 1000 | 30 days supply | Qty: 60 | Fill #1

## 2018-05-16 MED FILL — glipiZIDE XL 10 MG TB24: 10 | 30 days supply | Qty: 30 | Fill #1

## 2018-05-16 MED FILL — ?FUROSEMIDE 40 MG TABLET: 40 | 30 days supply | Qty: 60 | Fill #1

## 2018-05-16 MED FILL — ?PANTOPRAZOLE SO DR 40MG TA: 40 | 30 days supply | Qty: 30 | Fill #1

## 2018-05-16 MED FILL — LOSARTAN POTASSIUM 100 MG T: 100 | 30 days supply | Qty: 30 | Fill #1

## 2018-05-16 MED FILL — FERROUS SULFATE 325 MG TAB: 325 (65 FE) | 30 days supply | Qty: 60 | Fill #1

## 2018-05-16 MED FILL — POTASSIUM CL ER 20 MEQ TAB: 20 | 30 days supply | Qty: 60 | Fill #1

## 2018-05-16 MED FILL — OXYBUTYNIN CL ER 15 MG TAB: 15 | 30 days supply | Qty: 30 | Fill #1

## 2018-05-16 MED FILL — ?CARVEDILOL 25 MG TABLET: 25 | 30 days supply | Qty: 60 | Fill #1

## 2018-05-16 MED FILL — XARELTO 20 MG TABLET: 20 | 30 days supply | Qty: 30 | Fill #2

## 2018-05-25 ENCOUNTER — Encounter (HOSPITAL_BASED_OUTPATIENT_CLINIC_OR_DEPARTMENT_OTHER): Payer: Self-pay

## 2018-05-25 ENCOUNTER — Ambulatory Visit (HOSPITAL_BASED_OUTPATIENT_CLINIC_OR_DEPARTMENT_OTHER): Payer: Medicaid Other | Attending: Emergency Medicine

## 2018-05-26 ENCOUNTER — Other Ambulatory Visit: Payer: Self-pay

## 2018-05-26 DIAGNOSIS — F3162 Bipolar disorder, current episode mixed, moderate: Secondary | ICD-10-CM

## 2018-05-27 MED ORDER — RISPERIDONE 2 MG PO TABS: 2.0000 mg | ORAL_TABLET | Freq: Every day | ORAL | 1 refills | Status: DC

## 2018-06-09 ENCOUNTER — Ambulatory Visit (HOSPITAL_COMMUNITY)
Admission: RE | Admit: 2018-06-09 | Discharge: 2018-06-09 | Disposition: A | Discharge status: Home / Self Care | Payer: Medicaid Other | Source: Ambulatory Visit | Attending: Nurse Practitioner | Admitting: Nurse Practitioner

## 2018-06-09 ENCOUNTER — Encounter (HOSPITAL_COMMUNITY): Payer: Self-pay | Admitting: Nurse Practitioner

## 2018-06-09 VITALS — BP 138/82 | HR 99 | Ht 67.0 in | Wt 326.0 lb

## 2018-06-09 DIAGNOSIS — I481 Persistent atrial fibrillation: Secondary | ICD-10-CM | POA: Diagnosis not present

## 2018-06-09 DIAGNOSIS — Z79899 Other long term (current) drug therapy: Secondary | ICD-10-CM | POA: Diagnosis not present

## 2018-06-09 DIAGNOSIS — Z7901 Long term (current) use of anticoagulants: Secondary | ICD-10-CM | POA: Diagnosis not present

## 2018-06-09 DIAGNOSIS — G4733 Obstructive sleep apnea (adult) (pediatric): Secondary | ICD-10-CM | POA: Insufficient documentation

## 2018-06-09 DIAGNOSIS — Z6841 Body Mass Index (BMI) 40.0 and over, adult: Secondary | ICD-10-CM | POA: Diagnosis not present

## 2018-06-09 DIAGNOSIS — I110 Hypertensive heart disease with heart failure: Secondary | ICD-10-CM | POA: Insufficient documentation

## 2018-06-09 DIAGNOSIS — Z8249 Family history of ischemic heart disease and other diseases of the circulatory system: Secondary | ICD-10-CM | POA: Insufficient documentation

## 2018-06-09 DIAGNOSIS — F319 Bipolar disorder, unspecified: Secondary | ICD-10-CM | POA: Diagnosis not present

## 2018-06-09 DIAGNOSIS — I4819 Other persistent atrial fibrillation: Secondary | ICD-10-CM

## 2018-06-09 DIAGNOSIS — R9431 Abnormal electrocardiogram [ECG] [EKG]: Secondary | ICD-10-CM | POA: Diagnosis not present

## 2018-06-09 DIAGNOSIS — E1165 Type 2 diabetes mellitus with hyperglycemia: Secondary | ICD-10-CM | POA: Diagnosis not present

## 2018-06-09 DIAGNOSIS — Z888 Allergy status to other drugs, medicaments and biological substances status: Secondary | ICD-10-CM | POA: Insufficient documentation

## 2018-06-09 DIAGNOSIS — I5032 Chronic diastolic (congestive) heart failure: Secondary | ICD-10-CM | POA: Diagnosis not present

## 2018-06-09 DIAGNOSIS — Z794 Long term (current) use of insulin: Secondary | ICD-10-CM | POA: Insufficient documentation

## 2018-06-09 DIAGNOSIS — I4891 Unspecified atrial fibrillation: Secondary | ICD-10-CM | POA: Diagnosis present

## 2018-06-09 MED FILL — risperiDONE 2 MG TABS: 2 | 30 days supply | Qty: 30 | Fill #0

## 2018-06-09 NOTE — Progress Notes (Signed)
Primary Care Physician: [name], NP Referring Physician: [facility] f/u   [name] is a 52 y.o. female with a h/o morbid obesity, untreated sleep apnea,chronic diastolic HF, HTN, that was recently in the hospital for acute on chronic HF, uncontrolled type 2 DM, persistent afib. Prior to hospitalization, pt had unsuccessful cardioversion, after shocked x 3, in the afib clinic for evaluation  Pt continues in afib. She has sleep apnea for which she has not used cpap for years and probably will need a repeat study as she has had weight gain since the initial study. The mother is with her and reports that the daughter has been non complaint with her drugs, but since hospitalization, is trying to be more careful to take correctly. She is unaware of afib today.  F/u in afib clinic, 9/12. She feels good. Remains in rate controlled afib. She states that she is still unaware of afib, good energy and no unusual shortness of breath.   She will need to get sleep test  rescheduled as she was a no show for her sleep study set up by pulmonology.   Today, she denies symptoms of palpitations, chest pain, shortness of breath, orthopnea, PND, lower extremity edema, dizziness, presyncope, syncope, or neurologic sequela. The patient is tolerating medications without difficulties and is otherwise without complaint today.   Past Medical History:  Diagnosis Date  . Acute diastolic CHF (congestive heart failure) ([place]) 10/03/2015  . Acute on chronic diastolic CHF (congestive heart failure) ([place]) 08/03/2017  . Acute on chronic diastolic congestive heart failure ([place]) 11/02/2013  . Acute on chronic diastolic heart failure ([place]) 11/13/2015  . Benign essential HTN 11/28/2013  . Bipolar disease, chronic ([place])   . Chest pain    a. 2012 Myoview: EF 63%, no isch/infarct;  b. 04/2016 Lexiscan MV: EF 73%, no ischemia/infarct-->Low risk.  . Chronic diastolic CHF (congestive heart failure) ([place])    a. 2015 Echo: EF 55-60%, Gr2  DD;  b. 09/2015 Echo: EF 60-65%, no rwma, mod dil LA, PASP 3mmHg.  Marland Kitchen Chronic diastolic heart failure ([place]) 07/23/2011  . Cor pulmonale (chronic) ([place])   . Essential hypertension 03/07/2015  . History of thyrotoxicosis   . HTN (hypertension) 11/28/2013  . Hypertension   . Hypertensive heart disease 10/18/2013  . Insulin dependent type 2 diabetes mellitus, uncontrolled ([place])   . Mediastinal adenopathy   . Morbid obesity due to excess calories ([place]) 02/19/2011  . Morbid obesity with BMI of 50.0-59.9, adult ([place])   . OSA (obstructive sleep apnea) 03/06/2011  . Persistent atrial fibrillation ([place]) 12/09/2017  . Pulmonary HTN, moderate to severe 11/03/2013  . Sinusitis, chronic 01/02/2015  . SVT (supraventricular tachycardia) ([place]) 12/06/2013  . Uncontrolled type 2 diabetes mellitus with hyperglycemia [facility])    Past Surgical History:  Procedure Laterality Date  . CARDIOVERSION N/A 04/05/2018   Procedure: CARDIOVERSION;  Surgeon: Lelon Perla, MD;  Location: [facility] ENDOSCOPY;  Service: Cardiovascular;  Laterality: N/A;  . None      Current Outpatient Medications  Medication Sig Dispense Refill  . carvedilol (COREG) 25 MG tablet TAKE 1 TABLET BY MOUTH 2 TIMES DAILY. 180 tablet 3  . diltiazem (CARDIZEM CD) 240 MG 24 hr capsule Take 1 capsule (240 mg total) by mouth daily. 30 capsule 6  . ferrous sulfate 325 (65 FE) MG tablet Take 1 tablet (325 mg total) by mouth 2 (two) times daily. 60 tablet 3  . furosemide (LASIX) 40 MG tablet Take 1 tablet (40 mg total)  by mouth 2 (two) times daily. 60 tablet 6  . GLIPIZIDE XL 10 MG 24 hr tablet TAKE 1 TABLET (10 MG TOTAL) BY MOUTH DAILY WITH BREAKFAST. 30 tablet 2  . glucose blood test strip Use as instructed 100 each 12  . losartan (COZAAR) 100 MG tablet TAKE 1 TABLET (100 MG TOTAL) BY MOUTH DAILY. 30 tablet 2  . metFORMIN (GLUCOPHAGE) 1000 MG tablet TAKE 1 TABLET (1,000 MG TOTAL) BY MOUTH 2 (TWO) TIMES DAILY WITH A MEAL. 60 tablet 2  . oxybutynin (DITROPAN XL) 15  MG 24 hr tablet Take 1 tablet (15 mg total) by mouth at bedtime. 30 tablet 3  . potassium chloride SA (K-DUR,KLOR-CON) 20 MEQ tablet Take 1 tablet (20 mEq total) by mouth 2 (two) times daily. 60 tablet 6  . risperiDONE (RISPERDAL) 2 MG tablet Take 1 tablet (2 mg total) by mouth daily. 30 tablet 1  . rivaroxaban (XARELTO) 20 MG TABS tablet Take 1 tablet (20 mg total) by mouth daily with supper. 30 tablet 6  . VENTOLIN HFA 108 (90 Base) MCG/ACT inhaler INHALE 1 TO 2 PUFFS EVERY 6 HOURS AS NEEDED FOR WHEEZING/ SHORTNESS OF BREATH 18 g 1   No current facility-administered medications for this encounter.     Allergies  Allergen Reactions  . Acetaminophen Other (See Comments)    Seizure-like "fits" as a child  . Caffeine     Tense, anxiety, increased urination  . [place] [Dapagliflozin] Other (See Comments)    Hallucinations, drop in blood sugar  . Lisinopril Rash    Rash with lisinopril; but fosinopril is ok per patient    Social History   Socioeconomic History  . Marital status: Single    Spouse name: Not on file  . Number of children: 0  . Years of education: 66  . Highest education level: Not on file  Occupational History  . Occupation: unemployed  Social Needs  . Financial resource strain: Not on file  . Food insecurity:    Worry: Not on file    Inability: Not on file  . Transportation needs:    Medical: Not on file    Non-medical: Not on file  Tobacco Use  . Smoking status: Never Smoker  . Smokeless tobacco: Never Used  Substance and Sexual Activity  . Alcohol use: No  . Drug use: No  . Sexual activity: Not Currently    Birth control/protection: None  Lifestyle  . Physical activity:    Days per week: Not on file    Minutes per session: Not on file  . Stress: Not on file  Relationships  . Social connections:    Talks on phone: Not on file    Gets together: Not on file    Attends religious service: Not on file    Active member of club or organization: Not on  file    Attends meetings of clubs or organizations: Not on file    Relationship status: Not on file  . Intimate partner violence:    Fear of current or ex partner: Not on file    Emotionally abused: Not on file    Physically abused: Not on file    Forced sexual activity: Not on file  Other Topics Concern  . 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 But ran out of money and is no longer attending  school. (Note patient has bipolar disorder).      Born in [place] but lived in [place] before coming back to [place].       Primary language is Arabic. Lives with mother and brother.    Family History  Problem Relation Age of Onset  . Heart failure Father   . Stroke Father   . Hypertension Mother   . Heart disease Maternal Grandfather     ROS- All systems are reviewed and negative except as per the HPI above  Physical Exam: Vitals:   06/09/18 1345  BP: 138/82  Pulse: 99  Weight: (!) 147.9 kg  Height: 5\' 7"  (1.702 m)   Wt Readings from Last 3 Encounters:  06/09/18 (!) 147.9 kg  05/06/18 (!) 153.3 kg  05/03/18 (!) 154.2 kg    Labs: Lab Results  Component Value Date   NA 138 04/20/2018   K 4.0 04/20/2018   CL 98 04/20/2018   CO2 25 04/20/2018   GLUCOSE 209 (H) 04/20/2018   BUN 12 04/20/2018   CREATININE 0.80 04/20/2018   CALCIUM 8.7 04/20/2018   PHOS 4.2 02/05/2011   MG 1.6 08/24/2017   Lab Results  Component Value Date   INR 1.36 04/07/2018   Lab Results  Component Value Date   CHOL 173 08/06/2016   HDL 45 (L) 08/06/2016   LDLCALC 86 08/06/2016   TRIG 212 (H) 08/06/2016     GEN- The patient is well appearing, alert and oriented x 3 today.   Head- normocephalic, atraumatic Eyes-  Sclera clear, conjunctiva pink Ears- hearing intact Oropharynx- clear Neck- supple, no JVP Lymph- no cervical lymphadenopathy Lungs- Clear to ausculation bilaterally, normal  work of breathing Heart- irregular rate and rhythm, no murmurs, rubs or gallops, PMI not laterally displaced GI- soft, NT, ND, + BS Extremities- no clubbing, cyanosis, or edema MS- no significant deformity or atrophy Skin- no rash or lesion Psych- euthymic mood, full affect Neuro- strength and sensation are intact  EKG- Afib at 99 bpm, qrs int 80 ms, qtc 452 Echo-Study Conclusions  - Left ventricle: The cavity size was normal. Wall thickness was   increased in a pattern of mild LVH. Systolic function was normal.   The estimated ejection fraction was in the range of 55% to 60%. - Mitral valve: There was mild regurgitation. - Left atrium: The atrium was mildly dilated. - Right atrium: The atrium was mildly dilated. - Tricuspid valve: There was moderate regurgitation. - Pulmonary arteries: PA peak pressure: 41 mm Hg (S). - Pericardium, extracardiac: A trivial pericardial effusion was   identified.   Assessment and Plan: 1.Persisitent afib Failed cardioversion I discussed means of restoring sinus rhythm, but I fear her morbid obesity, lack of cpap treatment, and h/o non compliance  will be issues that will undermine our ability to restore SR.  I hesitate to use multaq or flecainide with her h/o of HF, she is on the young side for amiodarone and with ground glass opacities noted on previous CT's , best to avoid amiodarone  I also have concerns with Tikosyn or sotalol with non compliance issues For now, will schedule repeat sleep study, pt is to work on weight loss, has lost 5 lbs  since last visit She is rate controlled today and will continue with BB/CCB at current doses Continue xarelto for a chadsvasc score of at least 4  2. HTN Stable  3. Chronic diastolic HF Weight today is 326 llb, down from 332 lbs Continue furosemide at 40 mg  bid Weight daily Avoid salt  F/u with [name]  9/17 afib clinic as needed  Butch Penny C. Bridgett Hattabaugh, [facility] [address] [place], [place] [address] [phone]

## 2018-06-14 ENCOUNTER — Encounter: Payer: Self-pay | Admitting: Physician Assistant

## 2018-06-14 ENCOUNTER — Ambulatory Visit (INDEPENDENT_AMBULATORY_CARE_PROVIDER_SITE_OTHER): Payer: Medicaid Other | Admitting: Physician Assistant

## 2018-06-14 VITALS — BP 152/100 | HR 93 | Ht 67.0 in | Wt 332.0 lb

## 2018-06-14 DIAGNOSIS — I481 Persistent atrial fibrillation: Secondary | ICD-10-CM | POA: Diagnosis not present

## 2018-06-14 DIAGNOSIS — I4819 Other persistent atrial fibrillation: Secondary | ICD-10-CM

## 2018-06-14 DIAGNOSIS — I5032 Chronic diastolic (congestive) heart failure: Secondary | ICD-10-CM

## 2018-06-14 MED ORDER — DILTIAZEM HCL ER COATED BEADS 240 MG PO CP24: 240.0000 mg | ORAL_CAPSULE | Freq: Every day | ORAL | 6 refills | Status: DC

## 2018-06-14 MED FILL — DILTIAZEM 24HR ER 240 MG CA: 240 | 30 days supply | Qty: 30 | Fill #0

## 2018-06-14 NOTE — Patient Instructions (Addendum)
Medication Instructions:  Start Cardizem 240 mg after you have completed the medication dose you have at home.  If you need a refill on your cardiac medications before your next appointment, please call your pharmacy.  Labwork: BMET HERE IN OUR OFFICE AT [facility]  Take the provided lab slips with you to the lab for your blood draw.    Testing/Procedures: None ordered.  Special Instructions: Continue daily weights. Limit all liquid to 1-2 Liters daily. Limit sodium to 500 mg per meal.   GOAL BP- 130/80  Follow-Up: Your physician wants you to follow-up in: 3 months with Dr.Crenshaw, and visit with health coach.    Thank you for choosing [facility]!!

## 2018-06-14 NOTE — Progress Notes (Signed)
Cardiology Office Note   Date:  06/14/2018   ID:  [name], [name] 08/24/1966, MRN [id number]  PCP:  [name], NP  Cardiologist: Dr. [name], 04/05/2018 in hospital [name], PA-C   No chief complaint on file.   History of Present Illness: [name] is a 52 y.o. female with a history of D-CHF, persistent Afib w/ failed DCCV 02/2018 & 04/05/2018, HTN, obesity, MV 2017 w/ nl perfusion and EF 73%, SVT, chronic intermittent chest pain, CHA2DS2-VASc =3 (female, CHF, HTN) on Xarelto, OSA not on CPAP, mediastinal adenopathy, basilar groundglass infiltrates, and prior Mycobacterium avium infection.  04/05/2018 outpatient cardioversion, unsuccessful 04/07/2018, ER visit for hemoptysis plus shortness of breath and chest pain, patient admitted for CHF, discharge weight 348 pounds, diuresed 9.6 L, seen by ENT and blood felt most likely secondary to GI 04/27/2018 A. fib clinic visit, poor candidate for antiarrhythmic therapy, unlikely to maintain sinus rhythm due to morbid obesity, untreated OSA and noncompliance.  Sleep study scheduled, patient to work on weight loss and follow-up in 6 weeks 05/03/2018 office visit, Cardizem increased to 240 mg daily, weight down 8 pounds from discharge, repeat Myoview recommended 05/06/2018 pulmonary office visit, patient refused supplementary oxygen, sleep study arranged 06/09/2018 A. fib clinic visit, weight down to 325 pounds, no med changes  [name] presents for cardiology follow up.   She got a flu shot, felt a little bad afterwards but got over it. Her mother is still feeling poorly from the flu shot.  She is not having chest pain or SOB.  She is not waking with lower extremity edema.  She has some chronic orthopnea, and possible PND but that may be due to sleep apnea.  She does not have palpitations, no awareness of the atrial fibrillation, no presyncope or syncope.  She has not increased the Cardizem yet, is still on 180 mg qd.    She is not using the inhaler as much as before.   She is planning to do the sleep study.   She is looking for a job.  She has more positive feelings about different aspects of her life.    Past Medical History:  Diagnosis Date  . Acute on chronic diastolic congestive heart failure ([place]) 11/02/2013   10/03/2015, 11/13/2015, 08/03/2017  . Benign essential HTN 11/28/2013  . Bipolar disease, chronic ([place])   . Chest pain    a. 2012 Myoview: EF 63%, no isch/infarct;  b. 04/2016 Lexiscan MV: EF 73%, no ischemia/infarct-->Low risk.  . Chronic diastolic CHF (congestive heart failure) ([place]) 07/23/2011   a. 2015 Echo: EF 55-60%, Gr2 DD;  b. 09/2015 Echo: EF 60-65%, no rwma, mod dil LA, PASP 82mmHg.  . Cor pulmonale (chronic) ([place])   . History of thyrotoxicosis   . HTN (hypertension) 11/28/2013  . Hypertensive heart disease 10/18/2013  . Insulin dependent type 2 diabetes mellitus, uncontrolled ([place])   . Mediastinal adenopathy   . Morbid obesity due to excess calories ([place]) 02/19/2011  . Morbid obesity with BMI of 50.0-59.9, adult ([place])   . OSA (obstructive sleep apnea) 03/06/2011  . Persistent atrial fibrillation ([place]) 12/09/2017  . Pulmonary HTN, moderate to severe 11/03/2013  . Sinusitis, chronic 01/02/2015  . SVT (supraventricular tachycardia) ([place]) 12/06/2013  . Uncontrolled type 2 diabetes mellitus with hyperglycemia [facility])     Past Surgical History:  Procedure Laterality Date  . CARDIOVERSION N/A 04/05/2018   Procedure: CARDIOVERSION;  Surgeon: Lelon Perla, MD;  Location: [place];  Service: Cardiovascular;  Laterality: N/A;  . None      Current Outpatient Medications  Medication Sig Dispense Refill  . carvedilol (COREG) 25 MG tablet TAKE 1 TABLET BY MOUTH 2 TIMES DAILY. 180 tablet 3  . diltiazem (CARDIZEM CD) 240 MG 24 hr capsule Take 1 capsule (240 mg total) by mouth daily. 30 capsule 6  . ferrous sulfate 325 (65 FE) MG tablet Take 1 tablet (325 mg total) by mouth 2 (two) times  daily. 60 tablet 3  . furosemide (LASIX) 40 MG tablet Take 1 tablet (40 mg total) by mouth 2 (two) times daily. 60 tablet 6  . GLIPIZIDE XL 10 MG 24 hr tablet TAKE 1 TABLET (10 MG TOTAL) BY MOUTH DAILY WITH BREAKFAST. 30 tablet 2  . glucose blood test strip Use as instructed 100 each 12  . losartan (COZAAR) 100 MG tablet TAKE 1 TABLET (100 MG TOTAL) BY MOUTH DAILY. 30 tablet 2  . metFORMIN (GLUCOPHAGE) 1000 MG tablet TAKE 1 TABLET (1,000 MG TOTAL) BY MOUTH 2 (TWO) TIMES DAILY WITH A MEAL. 60 tablet 2  . oxybutynin (DITROPAN XL) 15 MG 24 hr tablet Take 1 tablet (15 mg total) by mouth at bedtime. 30 tablet 3  . potassium chloride SA (K-DUR,KLOR-CON) 20 MEQ tablet Take 1 tablet (20 mEq total) by mouth 2 (two) times daily. 60 tablet 6  . risperiDONE (RISPERDAL) 2 MG tablet Take 1 tablet (2 mg total) by mouth daily. 30 tablet 1  . VENTOLIN HFA 108 (90 Base) MCG/ACT inhaler INHALE 1 TO 2 PUFFS EVERY 6 HOURS AS NEEDED FOR WHEEZING/ SHORTNESS OF BREATH 18 g 1  . rivaroxaban (XARELTO) 20 MG TABS tablet Take 1 tablet (20 mg total) by mouth daily with supper. 30 tablet 6   No current facility-administered medications for this visit.     Allergies:   Acetaminophen; Caffeine; Farxiga [dapagliflozin]; and Lisinopril    Social History:  The patient  reports that she has never smoked. She has never used smokeless tobacco. She reports that she does not drink alcohol or use drugs.   Family History:  The patient's family history includes Heart disease in her maternal grandfather; Heart failure in her father; Hypertension in her mother; Stroke in her father.    ROS:  Please see the history of present illness. All other systems are reviewed and negative.    PHYSICAL EXAM: VS:  BP (!) 152/100   Pulse 93   Ht 5\' 7"  (1.702 m)   Wt (!) 332 lb (150.6 kg)   BMI 52.00 kg/m  , BMI Body mass index is 52 kg/m. GEN: Well nourished, well developed, female in no acute distress  HEENT: normal for age  Neck: no JVD  seen, but difficult to assess secondary to body habitus, no carotid bruit, no masses Cardiac: Irregular rate and rhythm; no murmur, no rubs, or gallops Respiratory:  clear to auscultation bilaterally, normal work of breathing GI: soft, nontender, nondistended, + BS MS: no deformity or atrophy; no edema; distal pulses are 2+ in all 4 extremities   Skin: warm and dry, no rash Neuro:  Strength and sensation are intact Psych: euthymic mood, full affect   EKG:  EKG is not ordered today.  ECHO: 04/08/2018 - Left ventricle: The cavity size was normal. Wall thickness was   increased in a pattern of mild LVH. Systolic function was normal.   The estimated ejection fraction was in the range of 55% to 60%. - Mitral valve: There was mild regurgitation. - Left atrium:  The atrium was mildly dilated. - Right atrium: The atrium was mildly dilated. - Tricuspid valve: There was moderate regurgitation. - Pulmonary arteries: PA peak pressure: 41 mm Hg (S). - Pericardium, extracardiac: A trivial pericardial effusion was   identified.   Recent Labs: 08/24/2017: Magnesium 1.6 12/09/2017: TSH 2.910 12/23/2017: B Natriuretic Peptide 285.1 04/08/2018: ALT 14 04/13/2018: Hemoglobin 11.9; Platelets 235 04/20/2018: BUN 12; Creatinine, Ser 0.80; Potassium 4.0; Sodium 138    Lipid Panel    Component Value Date/Time   CHOL 173 08/06/2016 1245   TRIG 212 (H) 08/06/2016 1245   HDL 45 (L) 08/06/2016 1245   CHOLHDL 3.8 08/06/2016 1245   VLDL 42 (H) 08/06/2016 1245   LDLCALC 86 08/06/2016 1245     Wt Readings from Last 3 Encounters:  06/14/18 (!) 332 lb (150.6 kg)  06/09/18 (!) 326 lb (147.9 kg)  05/06/18 (!) 338 lb (153.3 kg)     Other studies Reviewed: Additional studies/ records that were reviewed today include: Office notes, hospital records and testing.  ASSESSMENT AND PLAN:  1.  Persistent atrial fibrillation: Her heart rate still seems to be high much of the time.  She is again encouraged to  increase the Cardizem up to 240 mg daily. -Pursue rate control for now. -Continue anticoagulation.  Medication compliance is reinforced  2.  Chronic diastolic CHF: She is encouraged to weigh herself daily.  Her weight is trending down and she seems to feel more positively about things. -Continue current Lasix dosing. -Check a BMET today.   Current medicines are reviewed at length with the patient today.  The patient has concerns regarding medicines.  Concerns were addressed  The following changes have been made: Increase Cardizem to 240 mg daily  Labs/ tests ordered today include:   BMET   Disposition:   FU with Dr. [name]  Signed, [name], PA-C  06/14/2018 3:27 PM    [place] Phone: [phone]; Fax: [phone]  This note was written with the assistance of speech recognition software. Please excuse any transcriptional errors.

## 2018-06-15 LAB — BASIC METABOLIC PANEL
BUN / CREAT RATIO: 19 (ref 9–23)
BUN: 13 mg/dL (ref 6–24)
CHLORIDE: 99 mmol/L (ref 96–106)
CO2: 20 mmol/L (ref 20–29)
CREATININE: 0.69 mg/dL (ref 0.57–1.00)
Calcium: 9.2 mg/dL (ref 8.7–10.2)
GFR calc Af Amer: 117 mL/min/{1.73_m2} (ref >59)
GFR calc non Af Amer: 102 mL/min/{1.73_m2} (ref >59)
GLUCOSE: 230 mg/dL — AB (ref 65–99)
Potassium: 4.3 mmol/L (ref 3.5–5.2)
SODIUM: 139 mmol/L (ref 134–144)

## 2018-06-20 MED FILL — ?PANTOPRAZOLE SO DR 40MG TA: 40 | 30 days supply | Qty: 30 | Fill #2

## 2018-06-20 MED FILL — POTASSIUM CL ER 20 MEQ TAB: 20 | 30 days supply | Qty: 60 | Fill #2

## 2018-06-21 ENCOUNTER — Ambulatory Visit: Payer: Medicaid Other | Attending: Nurse Practitioner | Admitting: Nurse Practitioner

## 2018-06-21 ENCOUNTER — Encounter: Payer: Self-pay | Admitting: Nurse Practitioner

## 2018-06-21 ENCOUNTER — Telehealth: Payer: Self-pay | Admitting: Emergency Medicine

## 2018-06-21 VITALS — BP 141/91 | HR 90 | Temp 98.7°F | Resp 14 | Ht 67.0 in | Wt 344.8 lb

## 2018-06-21 DIAGNOSIS — Z8249 Family history of ischemic heart disease and other diseases of the circulatory system: Secondary | ICD-10-CM | POA: Insufficient documentation

## 2018-06-21 DIAGNOSIS — Z7984 Long term (current) use of oral hypoglycemic drugs: Secondary | ICD-10-CM | POA: Insufficient documentation

## 2018-06-21 DIAGNOSIS — G4733 Obstructive sleep apnea (adult) (pediatric): Secondary | ICD-10-CM | POA: Diagnosis not present

## 2018-06-21 DIAGNOSIS — I5032 Chronic diastolic (congestive) heart failure: Secondary | ICD-10-CM | POA: Diagnosis not present

## 2018-06-21 DIAGNOSIS — E1165 Type 2 diabetes mellitus with hyperglycemia: Secondary | ICD-10-CM | POA: Insufficient documentation

## 2018-06-21 DIAGNOSIS — I481 Persistent atrial fibrillation: Secondary | ICD-10-CM | POA: Insufficient documentation

## 2018-06-21 DIAGNOSIS — I10 Essential (primary) hypertension: Secondary | ICD-10-CM | POA: Diagnosis not present

## 2018-06-21 DIAGNOSIS — F319 Bipolar disorder, unspecified: Secondary | ICD-10-CM | POA: Insufficient documentation

## 2018-06-21 DIAGNOSIS — Z6841 Body Mass Index (BMI) 40.0 and over, adult: Secondary | ICD-10-CM | POA: Diagnosis not present

## 2018-06-21 DIAGNOSIS — Z1211 Encounter for screening for malignant neoplasm of colon: Secondary | ICD-10-CM | POA: Diagnosis not present

## 2018-06-21 DIAGNOSIS — N393 Stress incontinence (female) (male): Secondary | ICD-10-CM | POA: Insufficient documentation

## 2018-06-21 DIAGNOSIS — Z7901 Long term (current) use of anticoagulants: Secondary | ICD-10-CM | POA: Diagnosis not present

## 2018-06-21 DIAGNOSIS — I272 Pulmonary hypertension, unspecified: Secondary | ICD-10-CM | POA: Insufficient documentation

## 2018-06-21 DIAGNOSIS — Z1231 Encounter for screening mammogram for malignant neoplasm of breast: Secondary | ICD-10-CM

## 2018-06-21 DIAGNOSIS — I110 Hypertensive heart disease with heart failure: Secondary | ICD-10-CM | POA: Diagnosis not present

## 2018-06-21 LAB — GLUCOSE, POCT (MANUAL RESULT ENTRY): POC Glucose: 191 mg/dl — AB (ref 70–99)

## 2018-06-21 LAB — POCT GLYCOSYLATED HEMOGLOBIN (HGB A1C): HEMOGLOBIN A1C: 9.2 % — AB (ref 4.0–5.6)

## 2018-06-21 MED ORDER — ALBUTEROL SULFATE HFA 108 (90 BASE) MCG/ACT IN AERS: INHALATION_SPRAY | RESPIRATORY_TRACT | 1 refills | Status: DC

## 2018-06-21 MED ORDER — LOSARTAN POTASSIUM 100 MG PO TABS
100.0000 mg | ORAL_TABLET | Freq: Every day | ORAL | 2 refills | Status: DC
Start: 2018-06-21 — End: 2018-08-09

## 2018-06-21 MED ORDER — METFORMIN HCL 1000 MG PO TABS: 1000.0000 mg | ORAL_TABLET | Freq: Two times a day (BID) | ORAL | 2 refills | Status: DC

## 2018-06-21 MED ORDER — GLIPIZIDE ER 10 MG PO TB24: 10.0000 mg | ORAL_TABLET | Freq: Every day | ORAL | 2 refills | Status: DC

## 2018-06-21 MED ORDER — SITAGLIPTIN PHOS-METFORMIN HCL 50-1000 MG PO TABS: 1.0000 | ORAL_TABLET | Freq: Two times a day (BID) | ORAL | 1 refills | Status: DC

## 2018-06-21 MED ORDER — OXYBUTYNIN CHLORIDE ER 15 MG PO TB24: 15.0000 mg | ORAL_TABLET | Freq: Every day | ORAL | 3 refills | Status: DC

## 2018-06-21 MED ORDER — GLUCOSE BLOOD VI STRP: ORAL_STRIP | 12 refills | Status: DC

## 2018-06-21 MED FILL — FUROSEMIDE 40 MG TAB: 40 | 30 days supply | Qty: 60 | Fill #2

## 2018-06-21 MED FILL — TRUE METRIX TEST STRIP: 50 days supply | Qty: 100 | Fill #0

## 2018-06-21 MED FILL — FERROUS SULFATE 325 MG TAB: 325 (65 FE) | 30 days supply | Qty: 60 | Fill #2

## 2018-06-21 MED FILL — metFORMIN HCL 1000 MG TABS: 1000 | 30 days supply | Qty: 60 | Fill #2

## 2018-06-21 MED FILL — XARELTO 20 MG TABLET: 20 | 30 days supply | Qty: 30 | Fill #3

## 2018-06-21 MED FILL — CARVEDILOL 25 MG TABLET: 25 | 30 days supply | Qty: 60 | Fill #2

## 2018-06-21 MED FILL — glipiZIDE XL 10 MG TB24: 10 | 30 days supply | Qty: 30 | Fill #2

## 2018-06-21 MED FILL — OXYBUTYNIN CL ER 15 MG TAB: 15 | 30 days supply | Qty: 30 | Fill #2

## 2018-06-21 MED FILL — PROVENTIL HFA 108 (90 BASE): 108 (90 BAS | 30 days supply | Qty: 7 | Fill #0

## 2018-06-21 MED FILL — LOSARTAN POTASSIUM 100 MG T: 100 | 30 days supply | Qty: 30 | Fill #2

## 2018-06-21 NOTE — Telephone Encounter (Signed)
Patient would like to have sleep study performed since she was unable to obtain it when originally scheduled. Patient had the flu. [facility] can we reschedule the patient for this.

## 2018-06-21 NOTE — Telephone Encounter (Signed)
lmam for sleep center to call patient and resc appt

## 2018-06-21 NOTE — Progress Notes (Signed)
Assessment & Plan:  [name] was seen today for follow-up.  Diagnoses and all orders for this visit:  Uncontrolled type 2 diabetes mellitus with hyperglycemia (HCC) -     Glucose (CBG) -     HgB A1c -     glucose blood test strip; Use as instructed. Check blood glucose levels by fingerstick twice per day -     glipiZIDE (GLIPIZIDE XL) 10 MG 24 hr tablet; Take 1 tablet (10 mg total) by mouth daily with breakfast. -     sitaGLIPtin-metformin (JANUMET) 50-1000 MG tablet; Take 1 tablet by mouth 2 (two) times daily with a meal. -     Lipid panel Continue blood sugar control as discussed in office today, low carbohydrate diet, and regular physical exercise as tolerated, 150 minutes per week (30 min each day, 5 days per week, or 50 min 3 days per week). Keep blood sugar logs with fasting goal of 90-130 mg/dl, post prandial (after you eat) less than 180.  For Hypoglycemia: BS <60 and Hyperglycemia BS >400; contact the clinic ASAP. Annual eye exams and foot exams are recommended.   Breast cancer screening by mammogram -     MM 3D SCREEN BREAST BILATERAL; Future  Essential hypertension -     losartan (COZAAR) 100 MG tablet; Take 1 tablet (100 mg total) by mouth daily. Continue all antihypertensives as prescribed.  Remember to bring in your blood pressure log with you for your follow up appointment.  DASH/Mediterranean Diets are healthier choices for HTN.    Stress incontinence in female -     Ambulatory referral to Urogynecology -     oxybutynin (DITROPAN XL) 15 MG 24 hr tablet; Take 1 tablet (15 mg total) by mouth at bedtime.  Colon cancer screening -     Fecal occult blood, imunochemical([facility])  Other orders -     albuterol (VENTOLIN HFA) 108 (90 Base) MCG/ACT inhaler; INHALE 1 TO 2 PUFFS EVERY 6 HOURS AS NEEDED FOR WHEEZING/ SHORTNESS OF BREATH    Patient has been counseled on age-appropriate routine health concerns for screening and prevention. These are reviewed and  up-to-date. Referrals have been placed accordingly. Immunizations are up-to-date or declined.    Subjective:   Chief Complaint  Patient presents with  . Follow-up    Pt. is here to follow-up on diabetes.    HPI [name] [age] y.o. female presents to office today for DM. She is currently being followed by cardiology for chronic diastolic heart failure. O2 sats below normal limits. She declines O2 walking test.   Diabetes Mellitus Type 2 A1C is not improving. She has been out of strips for 10 days but notes previous glucose readings have been consistently in the 200s. She is not diet or exercise compliant.  She denies any hypo or hyperglycemic symptoms. Endorses medication compliance however I am not sure if she is being forthcoming. Medications include glipize 10mg  daily and metformin 1000g BID. Will switch metformin to janumet. She is due for eye exam. She is refusing insulin.  Lab Results  Component Value Date   HGBA1C 9.2 (A) 06/21/2018    CHRONIC HYPERTENSION  Blood pressure range BP Readings from Last 3 Encounters:  06/21/18 (!) 141/91  06/14/18 (!) 152/100  06/09/18 138/82   Chest pain: no   Dyspnea: yes   Claudication: no  Medication compliance: yes, taking cozaar 100 mg daily, carvedilol 25 mg BID  Medication Side Effects  Lightheadedness: no   Urinary frequency: yes   Edema:  no   Impotence: no  Preventitive Healthcare:  Exercise: no   Diet Pattern: diet: general  Salt Restriction:  No. She is not weighing herself every day as she should be due to her history of CHF. I discussed the importance of daily weight monitoring in regards to CHF exacerbation.  BP Readings from Last 3 Encounters:  06/21/18 (!) 141/91  06/14/18 (!) 152/100  06/09/18 138/82   Urinary Incontinence: Patient complains of urinary incontinence. This has been present for several months. She leaks urine with with urge, with a full bladder, during the night. Patient describes the symptoms as  urge to  urinate with little or no warning and urine leaking unpredictably.  Factors associated with symptoms include medications (lasix). Evaluation to date includes none.Treatment to date includes oxybutinin, which was not very effective.   Review of Systems  Constitutional: Negative for fever, malaise/fatigue and weight loss.  HENT: Negative.  Negative for nosebleeds.   Eyes: Negative.  Negative for blurred vision, double vision and photophobia.  Respiratory: Positive for shortness of breath. Negative for cough.   Cardiovascular: Negative.  Negative for chest pain, palpitations and leg swelling.  Gastrointestinal: Negative.  Negative for heartburn, nausea and vomiting.  Genitourinary: Positive for urgency.       Incontinence  Musculoskeletal: Negative.  Negative for myalgias.  Neurological: Negative.  Negative for dizziness, focal weakness, seizures and headaches.  Psychiatric/Behavioral: Negative.  Negative for suicidal ideas.    Past Medical History:  Diagnosis Date  . Acute on chronic diastolic congestive heart failure ([place]) 11/02/2013   10/03/2015, 11/13/2015, 08/03/2017  . Benign essential HTN 11/28/2013  . Bipolar disease, chronic ([place])   . Chest pain    a. 2012 Myoview: EF 63%, no isch/infarct;  b. 04/2016 Lexiscan MV: EF 73%, no ischemia/infarct-->Low risk.  . Chronic diastolic CHF (congestive heart failure) ([place]) 07/23/2011   a. 2015 Echo: EF 55-60%, Gr2 DD;  b. 09/2015 Echo: EF 60-65%, no rwma, mod dil LA, PASP 90mmHg.  . Cor pulmonale (chronic) ([place])   . History of thyrotoxicosis   . HTN (hypertension) 11/28/2013  . Hypertensive heart disease 10/18/2013  . Insulin dependent type 2 diabetes mellitus, uncontrolled ([place])   . Mediastinal adenopathy   . Morbid obesity due to excess calories ([place]) 02/19/2011  . Morbid obesity with BMI of 50.0-59.9, adult ([place])   . OSA (obstructive sleep apnea) 03/06/2011  . Persistent atrial fibrillation ([place]) 12/09/2017  . Pulmonary HTN, moderate to severe  11/03/2013  . Sinusitis, chronic 01/02/2015  . SVT (supraventricular tachycardia) ([place]) 12/06/2013  . Uncontrolled type 2 diabetes mellitus with hyperglycemia [facility])     Past Surgical History:  Procedure Laterality Date  . CARDIOVERSION N/A 04/05/2018   Procedure: CARDIOVERSION;  Surgeon: Lelon Perla, MD;  Location: [facility] ENDOSCOPY;  Service: Cardiovascular;  Laterality: N/A;  . None      Family History  Problem Relation Age of Onset  . Heart failure Father   . Stroke Father   . Hypertension Mother   . Heart disease Maternal Grandfather     Social History Reviewed with no changes to be made today.   Outpatient Medications Prior to Visit  Medication Sig Dispense Refill  . carvedilol (COREG) 25 MG tablet TAKE 1 TABLET BY MOUTH 2 TIMES DAILY. 180 tablet 3  . diltiazem (CARDIZEM CD) 240 MG 24 hr capsule Take 1 capsule (240 mg total) by mouth daily. 30 capsule 6  . ferrous sulfate 325 (65 FE) MG tablet Take 1 tablet (325 mg  total) by mouth 2 (two) times daily. 60 tablet 3  . furosemide (LASIX) 40 MG tablet Take 1 tablet (40 mg total) by mouth 2 (two) times daily. 60 tablet 6  . potassium chloride SA (K-DUR,KLOR-CON) 20 MEQ tablet Take 1 tablet (20 mEq total) by mouth 2 (two) times daily. 60 tablet 6  . risperiDONE (RISPERDAL) 2 MG tablet Take 1 tablet (2 mg total) by mouth daily. 30 tablet 1  . GLIPIZIDE XL 10 MG 24 hr tablet TAKE 1 TABLET (10 MG TOTAL) BY MOUTH DAILY WITH BREAKFAST. 30 tablet 2  . glucose blood test strip Use as instructed 100 each 12  . losartan (COZAAR) 100 MG tablet TAKE 1 TABLET (100 MG TOTAL) BY MOUTH DAILY. 30 tablet 2  . metFORMIN (GLUCOPHAGE) 1000 MG tablet TAKE 1 TABLET (1,000 MG TOTAL) BY MOUTH 2 (TWO) TIMES DAILY WITH A MEAL. 60 tablet 2  . oxybutynin (DITROPAN XL) 15 MG 24 hr tablet Take 1 tablet (15 mg total) by mouth at bedtime. 30 tablet 3  . VENTOLIN HFA 108 (90 Base) MCG/ACT inhaler INHALE 1 TO 2 PUFFS EVERY 6 HOURS AS NEEDED FOR WHEEZING/ SHORTNESS OF  BREATH 18 g 1  . rivaroxaban (XARELTO) 20 MG TABS tablet Take 1 tablet (20 mg total) by mouth daily with supper. 30 tablet 6   No facility-administered medications prior to visit.     Allergies  Allergen Reactions  . Acetaminophen Other (See Comments)    Seizure-like "fits" as a child  . Caffeine     Tense, anxiety, increased urination  . [place] [Dapagliflozin] Other (See Comments)    Hallucinations, drop in blood sugar  . Lisinopril Rash    Rash with lisinopril; but fosinopril is ok per patient       Objective:    BP (!) 141/91 (BP Location: Left Arm, Patient Position: Sitting, Cuff Size: Large)   Pulse 90   Temp 98.7 F (37.1 C) (Oral)   Ht 5\' 7"  (1.702 m)   Wt (!) 344 lb 12.8 oz (156.4 kg)   SpO2 92%   BMI 54.00 kg/m  Wt Readings from Last 3 Encounters:  06/21/18 (!) 344 lb 12.8 oz (156.4 kg)  06/14/18 (!) 332 lb (150.6 kg)  06/09/18 (!) 326 lb (147.9 kg)    Physical Exam  Constitutional: She is oriented to person, place, and time. She appears well-developed and well-nourished. She is cooperative.  HENT:  Head: Normocephalic and atraumatic.  Eyes: EOM are normal.  Neck: Normal range of motion.  Cardiovascular: Normal rate, regular rhythm and normal heart sounds. Exam reveals no gallop and no friction rub.  No murmur heard. Pulmonary/Chest: Effort normal. Tachypnea noted. No respiratory distress. She has no decreased breath sounds. She has no wheezes. She has no rhonchi. She has no rales. She exhibits no tenderness.  Abdominal: Bowel sounds are normal.  Musculoskeletal: Normal range of motion. She exhibits no edema.  Neurological: She is alert and oriented to person, place, and time. Coordination normal.  Skin: Skin is warm and dry.  Psychiatric: Her behavior is normal. Judgment and thought content normal. Her mood appears anxious.  Nursing note and vitals reviewed.      Patient has been counseled extensively about nutrition and exercise as well as the  importance of adherence with medications and regular follow-up. The patient was given clear instructions to go to ER or return to medical center if symptoms don't improve, worsen or new problems develop. The patient verbalized understanding.   Follow-up: Return  in about 3 months (around 09/20/2018) for DM.   [name], FNP-BC [facility] [place], [place]   06/23/2018, 12:20 AM

## 2018-06-21 NOTE — Progress Notes (Signed)
poc

## 2018-06-22 LAB — LIPID PANEL
CHOL/HDL RATIO: 3.4 ratio (ref 0.0–4.4)
Cholesterol, Total: 153 mg/dL (ref 100–199)
HDL: 45 mg/dL (ref >39)
LDL Calculated: 68 mg/dL (ref 0–99)
Triglycerides: 199 mg/dL — ABNORMAL HIGH (ref 0–149)
VLDL CHOLESTEROL CAL: 40 mg/dL (ref 5–40)

## 2018-06-23 ENCOUNTER — Encounter: Payer: Self-pay | Admitting: Nurse Practitioner

## 2018-06-24 NOTE — Telephone Encounter (Signed)
Had to leave another message for sleep lab to contact the patient

## 2018-06-24 NOTE — Telephone Encounter (Signed)
This test still has not been scheduled per the pt's appointment desk. [facility] - can you guys follow up with the sleep center?

## 2018-06-28 NOTE — Telephone Encounter (Signed)
Spoke to [place] she is going to call the patient

## 2018-06-29 ENCOUNTER — Encounter: Payer: Self-pay | Admitting: Physician Assistant

## 2018-06-29 NOTE — Telephone Encounter (Signed)
As of today, this sleep study still has not been scheduled. Will leave message open to follow up on.

## 2018-06-29 NOTE — Telephone Encounter (Signed)
I have called and left a message for the patient to call the sleep lab at [phone] to resc her sleep study also

## 2018-07-14 ENCOUNTER — Encounter (HOSPITAL_COMMUNITY): Payer: Self-pay

## 2018-07-14 ENCOUNTER — Inpatient Hospital Stay (HOSPITAL_COMMUNITY)
Admission: EM | Admit: 2018-07-14 | Discharge: 2018-07-16 | DRG: 378 | Disposition: A | Discharge status: Home / Self Care | Payer: Medicaid Other | Attending: Family Medicine | Admitting: Family Medicine

## 2018-07-14 ENCOUNTER — Other Ambulatory Visit: Payer: Self-pay

## 2018-07-14 DIAGNOSIS — Z8249 Family history of ischemic heart disease and other diseases of the circulatory system: Secondary | ICD-10-CM | POA: Diagnosis not present

## 2018-07-14 DIAGNOSIS — G4733 Obstructive sleep apnea (adult) (pediatric): Secondary | ICD-10-CM | POA: Diagnosis not present

## 2018-07-14 DIAGNOSIS — K621 Rectal polyp: Secondary | ICD-10-CM | POA: Diagnosis present

## 2018-07-14 DIAGNOSIS — I4819 Other persistent atrial fibrillation: Secondary | ICD-10-CM | POA: Diagnosis present

## 2018-07-14 DIAGNOSIS — Z886 Allergy status to analgesic agent status: Secondary | ICD-10-CM

## 2018-07-14 DIAGNOSIS — I2781 Cor pulmonale (chronic): Secondary | ICD-10-CM | POA: Diagnosis present

## 2018-07-14 DIAGNOSIS — I2729 Other secondary pulmonary hypertension: Secondary | ICD-10-CM | POA: Diagnosis present

## 2018-07-14 DIAGNOSIS — R1110 Vomiting, unspecified: Secondary | ICD-10-CM | POA: Diagnosis present

## 2018-07-14 DIAGNOSIS — I4891 Unspecified atrial fibrillation: Secondary | ICD-10-CM | POA: Diagnosis not present

## 2018-07-14 DIAGNOSIS — K648 Other hemorrhoids: Secondary | ICD-10-CM | POA: Diagnosis present

## 2018-07-14 DIAGNOSIS — Z823 Family history of stroke: Secondary | ICD-10-CM

## 2018-07-14 DIAGNOSIS — D62 Acute posthemorrhagic anemia: Secondary | ICD-10-CM | POA: Diagnosis not present

## 2018-07-14 DIAGNOSIS — I5032 Chronic diastolic (congestive) heart failure: Secondary | ICD-10-CM | POA: Diagnosis present

## 2018-07-14 DIAGNOSIS — I10 Essential (primary) hypertension: Secondary | ICD-10-CM | POA: Diagnosis not present

## 2018-07-14 DIAGNOSIS — Z79899 Other long term (current) drug therapy: Secondary | ICD-10-CM

## 2018-07-14 DIAGNOSIS — Z6841 Body Mass Index (BMI) 40.0 and over, adult: Secondary | ICD-10-CM

## 2018-07-14 DIAGNOSIS — D123 Benign neoplasm of transverse colon: Secondary | ICD-10-CM | POA: Diagnosis present

## 2018-07-14 DIAGNOSIS — K920 Hematemesis: Secondary | ICD-10-CM | POA: Diagnosis not present

## 2018-07-14 DIAGNOSIS — K922 Gastrointestinal hemorrhage, unspecified: Secondary | ICD-10-CM | POA: Diagnosis present

## 2018-07-14 DIAGNOSIS — F319 Bipolar disorder, unspecified: Secondary | ICD-10-CM | POA: Diagnosis not present

## 2018-07-14 DIAGNOSIS — E1165 Type 2 diabetes mellitus with hyperglycemia: Secondary | ICD-10-CM | POA: Diagnosis present

## 2018-07-14 DIAGNOSIS — Z888 Allergy status to other drugs, medicaments and biological substances status: Secondary | ICD-10-CM

## 2018-07-14 DIAGNOSIS — I110 Hypertensive heart disease with heart failure: Secondary | ICD-10-CM | POA: Diagnosis not present

## 2018-07-14 DIAGNOSIS — Z7901 Long term (current) use of anticoagulants: Secondary | ICD-10-CM | POA: Diagnosis not present

## 2018-07-14 DIAGNOSIS — Z794 Long term (current) use of insulin: Secondary | ICD-10-CM | POA: Diagnosis not present

## 2018-07-14 DIAGNOSIS — K921 Melena: Principal | ICD-10-CM

## 2018-07-14 DIAGNOSIS — E538 Deficiency of other specified B group vitamins: Secondary | ICD-10-CM | POA: Diagnosis not present

## 2018-07-14 DIAGNOSIS — D649 Anemia, unspecified: Secondary | ICD-10-CM | POA: Diagnosis not present

## 2018-07-14 DIAGNOSIS — D122 Benign neoplasm of ascending colon: Secondary | ICD-10-CM | POA: Diagnosis not present

## 2018-07-14 HISTORY — DX: Hypoglycemia, unspecified: E16.2

## 2018-07-14 LAB — I-STAT BETA HCG BLOOD, ED (MC, WL, AP ONLY): I-stat hCG, quantitative: <5 m[IU]/mL (ref <5)

## 2018-07-14 LAB — CBC
HCT: 34.9 % — ABNORMAL LOW (ref 36.0–46.0)
Hemoglobin: 10.6 g/dL — ABNORMAL LOW (ref 12.0–15.0)
MCH: 26.8 pg (ref 26.0–34.0)
MCHC: 30.4 g/dL (ref 30.0–36.0)
MCV: 88.1 fL (ref 80.0–100.0)
NRBC: 0 % (ref 0.0–0.2)
PLATELETS: 280 10*3/uL (ref 150–400)
RBC: 3.96 MIL/uL (ref 3.87–5.11)
RDW: 15.7 % — AB (ref 11.5–15.5)
WBC: 6.6 10*3/uL (ref 4.0–10.5)

## 2018-07-14 LAB — COMPREHENSIVE METABOLIC PANEL
ALK PHOS: 98 U/L (ref 38–126)
ALT: 14 U/L (ref 0–44)
AST: 26 U/L (ref 15–41)
Albumin: 3.6 g/dL (ref 3.5–5.0)
Anion gap: 14 (ref 5–15)
BUN: 11 mg/dL (ref 6–20)
CALCIUM: 9.1 mg/dL (ref 8.9–10.3)
CO2: 23 mmol/L (ref 22–32)
CREATININE: 0.87 mg/dL (ref 0.44–1.00)
Chloride: 104 mmol/L (ref 98–111)
GFR calc non Af Amer: >60 mL/min (ref >60)
GLUCOSE: 257 mg/dL — AB (ref 70–99)
Potassium: 3.8 mmol/L (ref 3.5–5.1)
SODIUM: 141 mmol/L (ref 135–145)
Total Bilirubin: 0.7 mg/dL (ref 0.3–1.2)
Total Protein: 7.5 g/dL (ref 6.5–8.1)

## 2018-07-14 LAB — TYPE AND SCREEN
ABO/RH(D): AB POS
Antibody Screen: NEGATIVE

## 2018-07-14 MED ORDER — ONDANSETRON HCL 4 MG PO TABS: 4.0000 mg | ORAL_TABLET | Freq: Four times a day (QID) | ORAL | Status: DC | PRN

## 2018-07-14 MED ORDER — RISPERIDONE 1 MG PO TABS
2.0000 mg | ORAL_TABLET | Freq: Every day | ORAL | Status: DC
  Administered 2018-07-15 – 2018-07-16 (×2): 2 mg via ORAL
  Filled 2018-07-14 (×2): qty 2

## 2018-07-14 MED ORDER — ALBUTEROL SULFATE HFA 108 (90 BASE) MCG/ACT IN AERS: 1.0000 | INHALATION_SPRAY | Freq: Four times a day (QID) | RESPIRATORY_TRACT | Status: DC | PRN

## 2018-07-14 MED ORDER — ONDANSETRON HCL 4 MG/2ML IJ SOLN: 4.0000 mg | Freq: Four times a day (QID) | INTRAMUSCULAR | Status: DC | PRN

## 2018-07-14 MED ORDER — INSULIN ASPART 100 UNIT/ML ~~LOC~~ SOLN
0.0000 [IU] | Freq: Three times a day (TID) | SUBCUTANEOUS | Status: DC
  Administered 2018-07-15: 7 [IU] via SUBCUTANEOUS
  Administered 2018-07-15: 3 [IU] via SUBCUTANEOUS
  Administered 2018-07-15: 2 [IU] via SUBCUTANEOUS
  Administered 2018-07-16: 3 [IU] via SUBCUTANEOUS
  Administered 2018-07-16: 2 [IU] via SUBCUTANEOUS

## 2018-07-14 MED ORDER — FERROUS SULFATE 325 (65 FE) MG PO TABS
325.0000 mg | ORAL_TABLET | Freq: Two times a day (BID) | ORAL | Status: DC
  Administered 2018-07-14 – 2018-07-16 (×4): 325 mg via ORAL
  Filled 2018-07-14 (×4): qty 1

## 2018-07-14 MED ORDER — POTASSIUM CHLORIDE CRYS ER 20 MEQ PO TBCR
20.0000 meq | EXTENDED_RELEASE_TABLET | Freq: Two times a day (BID) | ORAL | Status: DC
  Administered 2018-07-14 – 2018-07-16 (×4): 20 meq via ORAL
  Filled 2018-07-14 (×4): qty 1

## 2018-07-14 MED ORDER — LOSARTAN POTASSIUM 50 MG PO TABS
100.0000 mg | ORAL_TABLET | Freq: Every day | ORAL | Status: DC
  Administered 2018-07-15 – 2018-07-16 (×2): 100 mg via ORAL
  Filled 2018-07-14 (×2): qty 2

## 2018-07-14 MED ORDER — ALBUTEROL SULFATE (2.5 MG/3ML) 0.083% IN NEBU: 2.5000 mg | INHALATION_SOLUTION | Freq: Four times a day (QID) | RESPIRATORY_TRACT | Status: DC | PRN

## 2018-07-14 MED ORDER — CARVEDILOL 25 MG PO TABS
25.0000 mg | ORAL_TABLET | Freq: Two times a day (BID) | ORAL | Status: DC
  Administered 2018-07-14 – 2018-07-16 (×4): 25 mg via ORAL
  Filled 2018-07-14 (×4): qty 1

## 2018-07-14 MED ORDER — OXYBUTYNIN CHLORIDE ER 5 MG PO TB24
15.0000 mg | ORAL_TABLET | Freq: Every day | ORAL | Status: DC
  Administered 2018-07-14 – 2018-07-15 (×2): 15 mg via ORAL
  Filled 2018-07-14 (×2): qty 3

## 2018-07-14 MED ORDER — DILTIAZEM HCL ER COATED BEADS 240 MG PO CP24
240.0000 mg | ORAL_CAPSULE | Freq: Every day | ORAL | Status: DC
  Administered 2018-07-15: 240 mg via ORAL
  Filled 2018-07-14 (×2): qty 1

## 2018-07-14 MED ORDER — POLYETHYLENE GLYCOL 3350 17 G PO PACK: 17.0000 g | PACK | Freq: Every day | ORAL | Status: DC | PRN

## 2018-07-14 MED ORDER — FUROSEMIDE 40 MG PO TABS
40.0000 mg | ORAL_TABLET | Freq: Two times a day (BID) | ORAL | Status: DC
  Administered 2018-07-15 – 2018-07-16 (×3): 40 mg via ORAL
  Filled 2018-07-14 (×3): qty 1

## 2018-07-14 NOTE — ED Notes (Signed)
Transport called.

## 2018-07-14 NOTE — ED Triage Notes (Addendum)
Patient states she has had bright red blood with clots in her stool yesterday x 1 and today x 4 episodes. Patient also c/o N/V. Patient is taking Xarelto.

## 2018-07-14 NOTE — ED Notes (Signed)
Report given to Calvin, RN.

## 2018-07-14 NOTE — H&P (Signed)
History and Physical    [name] WGN:[id number] DOB: 12-19-1965 DOA: 07/14/2018  PCP: Vanessa Pounds, NP  Patient coming from: Home  I have personally briefly reviewed patient's old medical records in [place]  Chief Complaint: Bright red blood per rectum  HPI: [name] is a 52 y.o. female with medical history significant of CHF, bipolar disorder, diabetes, CHF, pulmonary hypertension, presents with hematochezia.  Yesterday she had one bout of bright red blood per rectum.  Today she had several bouts and had abdominal cramping as well.  Her most recent episode was dark melanotic stool.  She has  A. fib and is on Xarelto.  She denies any other anti-inflammatories or aspirin.   hemoglobin slightly down to 10.6 from 11.9 prior.  She denies any prior history of GI bleeding or family history.   ED Course: Without frank blood on rectal exam per ER physician.  GI consulted who recommended clear liquid diet and holding anticoagulant.  Review of Systems: Pos For abdominal cramping and hematochezia, denies chest pain shortness of breath fever cough All others reviewed with patient  and are  negative unless otherwise stated   Past Medical History:  Diagnosis Date  . Acute on chronic diastolic congestive heart failure ([place]) 11/02/2013   10/03/2015, 11/13/2015, 08/03/2017  . Benign essential HTN 11/28/2013  . Bipolar disease, chronic ([place])   . Chest pain    a. 2012 Myoview: EF 63%, no isch/infarct;  b. 04/2016 Lexiscan MV: EF 73%, no ischemia/infarct-->Low risk.  . Chronic diastolic CHF (congestive heart failure) ([place]) 07/23/2011   a. 2015 Echo: EF 55-60%, Gr2 DD;  b. 09/2015 Echo: EF 60-65%, no rwma, mod dil LA, PASP 15mmHg.  . Cor pulmonale (chronic) ([place])   . History of thyrotoxicosis   . HTN (hypertension) 11/28/2013  . Hypertensive heart disease 10/18/2013  . Hypoglycemia   . Insulin dependent type 2 diabetes mellitus, uncontrolled ([place])   . Mediastinal adenopathy   .  Morbid obesity due to excess calories ([place]) 02/19/2011  . Morbid obesity with BMI of 50.0-59.9, adult ([place])   . OSA (obstructive sleep apnea) 03/06/2011  . Persistent atrial fibrillation 12/09/2017  . Pulmonary HTN, moderate to severe 11/03/2013  . Sinusitis, chronic 01/02/2015  . SVT (supraventricular tachycardia) ([place]) 12/06/2013  . Uncontrolled type 2 diabetes mellitus with hyperglycemia [facility])     Past Surgical History:  Procedure Laterality Date  . CARDIOVERSION N/A 04/05/2018   Procedure: CARDIOVERSION;  Surgeon: Lelon Perla, MD;  Location: [facility] ENDOSCOPY;  Service: Cardiovascular;  Laterality: N/A;  . None       reports that she has never smoked. She has never used smokeless tobacco. She reports that she does not drink alcohol or use drugs.  Allergies  Allergen Reactions  . Acetaminophen Other (See Comments)    Seizure-like "fits" as a child  . Caffeine     Tense, anxiety, increased urination  . [place] [Dapagliflozin] Other (See Comments)    Hallucinations, drop in blood sugar  . Lisinopril Rash    Rash with lisinopril; but fosinopril is ok per patient    Family History  Problem Relation Age of Onset  . Heart failure Father   . Stroke Father   . Hypertension Mother   . Heart disease Maternal Grandfather      Prior to Admission medications   Medication Sig Start Date End Date Taking? Authorizing Provider  albuterol (VENTOLIN HFA) 108 (90 Base) MCG/ACT inhaler INHALE 1 TO 2 PUFFS EVERY 6 HOURS AS  NEEDED FOR WHEEZING/ SHORTNESS OF BREATH 06/21/18  Yes Vanessa Pounds, NP  carvedilol (COREG) 25 MG tablet TAKE 1 TABLET BY MOUTH 2 TIMES DAILY. 04/18/18  Yes Lelon Perla, MD  diltiazem (CARDIZEM CD) 240 MG 24 hr capsule Take 1 capsule (240 mg total) by mouth daily. 06/14/18 09/12/18 Yes Barrett, Evelene Croon, PA-C  ferrous sulfate 325 (65 FE) MG tablet Take 1 tablet (325 mg total) by mouth 2 (two) times daily. 04/15/18  Yes Bhagat, Bhavinkumar, PA  furosemide (LASIX) 40 MG  tablet Take 1 tablet (40 mg total) by mouth 2 (two) times daily. 04/16/18  Yes Isaiah Serge, NP  glipiZIDE (GLIPIZIDE XL) 10 MG 24 hr tablet Take 1 tablet (10 mg total) by mouth daily with breakfast. 06/21/18  Yes Vanessa Pounds, NP  glucose blood test strip Use as instructed. Check blood glucose levels by fingerstick twice per day 06/21/18  Yes Vanessa Pounds, NP  losartan (COZAAR) 100 MG tablet Take 1 tablet (100 mg total) by mouth daily. 06/21/18 09/19/18 Yes Vanessa Pounds, NP  metFORMIN (GLUCOPHAGE) 1000 MG tablet Take 1 tablet by mouth 2 (two) times daily with a meal. 06/21/18  Yes [provider]  oxybutynin (DITROPAN XL) 15 MG 24 hr tablet Take 1 tablet (15 mg total) by mouth at bedtime. 06/21/18  Yes Vanessa Pounds, NP  potassium chloride SA (K-DUR,KLOR-CON) 20 MEQ tablet Take 1 tablet (20 mEq total) by mouth 2 (two) times daily. 04/15/18  Yes Bhagat, Bhavinkumar, PA  risperiDONE (RISPERDAL) 2 MG tablet Take 1 tablet (2 mg total) by mouth daily. 05/27/18  Yes Vanessa Pounds, NP  rivaroxaban (XARELTO) 20 MG TABS tablet Take 1 tablet (20 mg total) by mouth daily with supper. 03/21/18 07/14/18 Yes Vanessa Pounds, NP  sitaGLIPtin-metformin (JANUMET) 50-1000 MG tablet Take 1 tablet by mouth 2 (two) times daily with a meal. Patient not taking: Reported on 07/14/2018 06/21/18   Vanessa Pounds, NP    Physical Exam: Vitals:   07/14/18 1751 07/14/18 1830 07/14/18 1900 07/14/18 2006  BP: (!) 140/93 138/86 (!) 139/99 137/88  Pulse: 88 78 85 78  Resp: 16 17  17   Temp:    98.3 F (36.8 C)  TempSrc:    Oral  SpO2: 93% 90% 98% 100%  Weight:      Height:        Constitutional: NAD, calm, comfortable sitting on the side of bed Vitals:   07/14/18 1751 07/14/18 1830 07/14/18 1900 07/14/18 2006  BP: (!) 140/93 138/86 (!) 139/99 137/88  Pulse: 88 78 85 78  Resp: 16 17  17   Temp:    98.3 F (36.8 C)  TempSrc:    Oral  SpO2: 93% 90% 98% 100%  Weight:      Height:       Eyes:  PERRL, lids and conjunctivae normal ENMT: Mucous membranes are moist. Posterior pharynx clear of any exudate or lesions..  Neck: normal, supple, no masses,  Respiratory: clear to auscultation bilaterally, no wheezing, no crackles. Normal respiratory effort. No accessory muscle use.  Cardiovascular: Regular rate and rhythm, no murmurs / rubs / gallops. No extremity edema.  Abdomen: no tenderness, no masses palpated. No hepatosplenomegaly. Bowel sounds positive.  Morbidly obese Musculoskeletal: no clubbing / cyanosis. No joint deformity upper and lower extremities.  Skin: no rashes, lesions, ulcers. No induration Neurologic: CN 2-12 grossly intact.  Moves all extremities equally.  Psychiatric: Normal judgment and insight. Alert and oriented x 3.  Normal mood.     Labs on Admission: I have personally reviewed following labs and imaging studies  CBC: Recent Labs  Lab 07/14/18 1545  WBC 6.6  HGB 10.6*  HCT 34.9*  MCV 88.1  PLT 712   Basic Metabolic Panel: Recent Labs  Lab 07/14/18 1545  NA 141  K 3.8  CL 104  CO2 23  GLUCOSE 257*  BUN 11  CREATININE 0.87  CALCIUM 9.1   GFR: Estimated Creatinine Clearance: 123.3 mL/min (by C-G formula based on SCr of 0.87 mg/dL). Liver Function Tests: Recent Labs  Lab 07/14/18 1545  AST 26  ALT 14  ALKPHOS 98  BILITOT 0.7  PROT 7.5  ALBUMIN 3.6   No results for input(s): LIPASE, AMYLASE in the last 168 hours. No results for input(s): AMMONIA in the last 168 hours. Coagulation Profile: No results for input(s): INR, PROTIME in the last 168 hours. Cardiac Enzymes: No results for input(s): CKTOTAL, CKMB, CKMBINDEX, TROPONINI in the last 168 hours. BNP (last 3 results) No results for input(s): PROBNP in the last 8760 hours. HbA1C: No results for input(s): HGBA1C in the last 72 hours. CBG: No results for input(s): GLUCAP in the last 168 hours. Lipid Profile: No results for input(s): CHOL, HDL, LDLCALC, TRIG, CHOLHDL, LDLDIRECT in  the last 72 hours. Thyroid Function Tests: No results for input(s): TSH, T4TOTAL, FREET4, T3FREE, THYROIDAB in the last 72 hours. Anemia Panel: No results for input(s): VITAMINB12, FOLATE, FERRITIN, TIBC, IRON, RETICCTPCT in the last 72 hours. Urine analysis:    Component Value Date/Time   COLORURINE YELLOW 04/13/2018 1214   APPEARANCEUR HAZY (A) 04/13/2018 1214   LABSPEC 1.008 04/13/2018 1214   PHURINE 5.0 04/13/2018 1214   GLUCOSEU NEGATIVE 04/13/2018 1214   HGBUR SMALL (A) 04/13/2018 1214   BILIRUBINUR NEGATIVE 04/13/2018 1214   BILIRUBINUR neg 09/10/2014 1219   KETONESUR NEGATIVE 04/13/2018 1214   PROTEINUR 30 (A) 04/13/2018 1214   UROBILINOGEN 1.0 09/10/2014 1219   UROBILINOGEN 0.2 12/07/2013 0041   NITRITE NEGATIVE 04/13/2018 1214   LEUKOCYTESUR SMALL (A) 04/13/2018 1214    Radiological Exams on Admission: No results found.    Assessment/Plan Principal Problem:   Hematochezia Active Problems:   Bipolar disorder   Morbid obesity due to excess calories (HCC)   Chronic diastolic heart failure (HCC)   Benign essential HTN   Uncontrolled type 2 diabetes mellitus with hyperglycemia (HCC)   Persistent atrial fibrillation   Acute posthemorrhagic anemia   GIB (gastrointestinal bleeding) Chronic anticoagulation  -GI recommends clear liquid diet, holding anticoagulant, CBC in the a.m. -Check iron studies -Continue home medications for bipolar disorder -Continue home medications for hypertension -Blood sugar uncontrolled however per patient she has had hypoglycemic coma  in the past and her doctors have instructed her to no longer take long acting insulin and to have permissive hyperglycemia.  Hold oral meds as she is on clears only at this time.  Sliding scale insulin -Continue home blocking agent for A. fib rate ,control at this time   DVT prophylaxis: SCDs  code Status: Full Disposition Plan: Home 1 to 2 days after stable and seen by GI  Consults called: GI Dr.  Alessandra Bevels   Admission status: Inpatient telemetry  It is my clinical opinion that admission to INPATIENT is reasonable and necessary because of the expectation that this patient will require hospital care that crosses at least 2 midnights to treat this condition based on the medical complexity of the problems presented.     Elisheva Fallas  [place] MD [facility] Pager [phone]  If 7PM-7AM, please contact night-coverage [email or web address] Password [email or web address]  07/14/2018, 10:43 PM

## 2018-07-14 NOTE — ED Provider Notes (Signed)
Register DEPT Provider Note   CSN: [id number] Arrival date & time: 07/14/18  1501     History   Chief Complaint Chief Complaint  Patient presents with  . GI Bleeding  . Emesis    HPI [name] is a 52 y.o. female.  The history is provided by the patient. No language interpreter was used.  Emesis     [name] is a 52 y.o. female who presents to the Emergency Department complaining of GI bleeding. Presents to the emergency department complaining of hematochezia. She had wanted bloodied bowel movement yesterday in the morning. She felt well yesterday. This morning around 5 AM she developed recurrent body stools. She has had five bloodied bowel movements. Initially they were bright red and now they are born maroon colored. She has associated generalized abdominal cramping. She denies any fevers. She had nausea and emesis times one at 3 PM. Her emesis appeared like food. No prior similar symptoms. She does take Xarelto for history of atrial fibrillation. She denies any chest pain, shortness of breath. Does not have a gastroenterologist. Symptoms are moderate and constant nature. Past Medical History:  Diagnosis Date  . Acute on chronic diastolic congestive heart failure ([place]) 11/02/2013   10/03/2015, 11/13/2015, 08/03/2017  . Benign essential HTN 11/28/2013  . Bipolar disease, chronic ([place])   . Chest pain    a. 2012 Myoview: EF 63%, no isch/infarct;  b. 04/2016 Lexiscan MV: EF 73%, no ischemia/infarct-->Low risk.  . Chronic diastolic CHF (congestive heart failure) ([place]) 07/23/2011   a. 2015 Echo: EF 55-60%, Gr2 DD;  b. 09/2015 Echo: EF 60-65%, no rwma, mod dil LA, PASP 68mHg.  . Cor pulmonale (chronic) ([facility]   . History of thyrotoxicosis   . HTN (hypertension) 11/28/2013  . Hypertensive heart disease 10/18/2013  . Insulin dependent type 2 diabetes mellitus, uncontrolled ([facility]   . Mediastinal adenopathy   . Morbid obesity due to excess calories  ([place] 02/19/2011  . Morbid obesity with BMI of 50.0-59.9, adult ([place]   . OSA (obstructive sleep apnea) 03/06/2011  . Persistent atrial fibrillation 12/09/2017  . Pulmonary HTN, moderate to severe 11/03/2013  . Sinusitis, chronic 01/02/2015  . SVT (supraventricular tachycardia) ([facility] 12/06/2013  . Uncontrolled type 2 diabetes mellitus with hyperglycemia ([facility]     Patient Active Problem List   Diagnosis Date Noted  . Chronic respiratory failure with hypoxia (HW[place] 05/06/2018  . Persistent atrial fibrillation   . Uncontrolled type 2 diabetes mellitus with hyperglycemia ([place]   . Hypokalemia   . Cor pulmonale (chronic) ([place]   . Mycobacterium avium complex ([facility] 12/13/2015  . Pyrexia   . Dyspnea 11/13/2015  . Mediastinal adenopathy 11/13/2015  . Abnormal CT scan, chest 11/11/2015  . Chest pain 11/11/2015  . Essential hypertension 03/07/2015  . Depression (emotion) 03/07/2015  . Noninfectious gastroenteritis and colitis 01/02/2015  . Sinusitis, chronic 01/02/2015  . Midline low back pain without sciatica 09/10/2014  . Bipolar 1 disorder, mixed, moderate ([facility] 07/02/2014  . Stress incontinence 07/02/2014  . Mania ([place] 12/10/2013  . Speech abnormality 12/08/2013  . SVT (supraventricular tachycardia) ([facility] 12/06/2013  . Benign essential HTN 11/28/2013  . HTN (hypertension) 11/28/2013  . Pulmonary HTN, moderate to severe 11/03/2013  . Acute on chronic diastolic congestive heart failure (HE[place] 11/02/2013  . Hypertensive heart disease 10/18/2013  . Chronic diastolic heart failure ([facility] 07/23/2011  . OSA (obstructive sleep apnea)- non compliant with C-pap 03/06/2011  . Morbid obesity due to excess calories ([facility] 02/19/2011  .  Bipolar disorder     Past Surgical History:  Procedure Laterality Date  . CARDIOVERSION N/A 04/05/2018   Procedure: CARDIOVERSION;  Surgeon: Lelon Perla, MD;  Location[facility] ENDOSCOPY;  Service: Cardiovascular;  Laterality: N/A;  . None       OB History    None      Home Medications    Prior to Admission medications   Medication Sig Start Date End Date Taking? Authorizing Provider  albuterol (VENTOLIN HFA) 108 (90 Base) MCG/ACT inhaler INHALE 1 TO 2 PUFFS EVERY 6 HOURS AS NEEDED FOR WHEEZING/ SHORTNESS OF BREATH 06/21/18  Yes [name], NP  carvedilol (COREG) 25 MG tablet TAKE 1 TABLET BY MOUTH 2 TIMES DAILY. 04/18/18  Yes Lelon Perla, MD  diltiazem (CARDIZEM CD) 240 MG 24 hr capsule Take 1 capsule (240 mg total) by mouth daily. 06/14/18 09/12/18 Yes Barrett, Evelene Croon, PA-C  ferrous sulfate 325 (65 FE) MG tablet Take 1 tablet (325 mg total) by mouth 2 (two) times daily. 04/15/18  Yes Bhagat, Bhavinkumar, PA  furosemide (LASIX) 40 MG tablet Take 1 tablet (40 mg total) by mouth 2 (two) times daily. 04/16/18  Yes Isaiah Serge, NP  glipiZIDE (GLIPIZIDE XL) 10 MG 24 hr tablet Take 1 tablet (10 mg total) by mouth daily with breakfast. 06/21/18  Yes [name], NP  glucose blood test strip Use as instructed. Check blood glucose levels by fingerstick twice per day 06/21/18  Yes [name], NP  losartan (COZAAR) 100 MG tablet Take 1 tablet (100 mg total) by mouth daily. 06/21/18 09/19/18 Yes [name], NP  metFORMIN (GLUCOPHAGE) 1000 MG tablet Take 1 tablet by mouth 2 (two) times daily with a meal. 06/21/18  Yes [provider]  oxybutynin (DITROPAN XL) 15 MG 24 hr tablet Take 1 tablet (15 mg total) by mouth at bedtime. 06/21/18  Yes [name], NP  potassium chloride SA (K-DUR,KLOR-CON) 20 MEQ tablet Take 1 tablet (20 mEq total) by mouth 2 (two) times daily. 04/15/18  Yes Bhagat, Bhavinkumar, PA  risperiDONE (RISPERDAL) 2 MG tablet Take 1 tablet (2 mg total) by mouth daily. 05/27/18  Yes [name], NP  rivaroxaban (XARELTO) 20 MG TABS tablet Take 1 tablet (20 mg total) by mouth daily with supper. 03/21/18 07/14/18 Yes [name], NP  sitaGLIPtin-metformin (JANUMET) 50-1000 MG tablet Take 1 tablet by  mouth 2 (two) times daily with a meal. Patient not taking: Reported on 07/14/2018 06/21/18   [name], NP    Family History Family History  Problem Relation Age of Onset  . Heart failure Father   . Stroke Father   . Hypertension Mother   . Heart disease Maternal Grandfather     Social History Social History   Tobacco Use  . Smoking status: Never Smoker  . Smokeless tobacco: Never Used  Substance Use Topics  . Alcohol use: No  . Drug use: No     Allergies   Acetaminophen; Caffeine; Farxiga [dapagliflozin]; and Lisinopril   Review of Systems Review of Systems  Gastrointestinal: Positive for vomiting.  All other systems reviewed and are negative.    Physical Exam Updated Vital Signs BP (!) 132/102 (BP Location: Left Arm)   Pulse 91   Temp 98.6 F (37 C) (Oral)   Resp 20   Ht _0  (1.702 m)   Wt (!) 160.1 kg   SpO2 93%   BMI 55.29 kg/m   Physical Exam  Constitutional:  She is oriented to person, place, and time. She appears well-developed and well-nourished.  HENT:  Head: Normocephalic and atraumatic.  Cardiovascular: Normal rate and regular rhythm.  No murmur heard. Pulmonary/Chest: [place]ort normal and breath sounds normal. No respiratory distress.  Abdominal: Soft. There is no rebound and no guarding.  Peace abdomen with mild to moderate epigastric tenderness  Genitourinary:  Genitourinary Comments: No external hemorrhoids. No gross blood on rectal examination.  Musculoskeletal: She exhibits no edema or tenderness.  Neurological: She is alert and oriented to person, place, and time.  Skin: Skin is warm and dry.  Psychiatric: She has a normal mood and affect. Her behavior is normal.  Nursing note and vitals reviewed.    ED Treatments / Results  Labs (all labs ordered are listed, but only abnormal results are displayed) Labs Reviewed  COMPREHENSIVE METABOLIC PANEL - Abnormal; Notable for the following components:      Result Value   Glucose,  Bld 257 (*)    All other components within normal limits  CBC - Abnormal; Notable for the following components:   Hemoglobin 10.6 (*)    HCT 34.9 (*)    RDW 15.7 (*)    All other components within normal limits  I-STAT BETA HCG BLOOD, ED (MC, WL, AP ONLY)  POC OCCULT BLOOD, ED  TYPE AND SCREEN    EKG None  Radiology No results found.  Procedures Procedures (including critical care time)  Medications Ordered in ED Medications - No data to display   Initial Impression / Assessment and Plan / ED Course  I have reviewed the triage vital signs and the nursing notes.  Pertinent labs & imaging results that were available during my care of the patient were reviewed by me and considered in my medical decision making (see chart for details).     Patient here for evaluation of hematochezia, she takes Xarelto for atrial fibrillation. She is well appearing on examination. Labs demonstrate drop in hemoglobin to 10.6 from prior on comparison of 11.93 months ago. No gross blood on rectal examination today. Discussed with Dr. Alessandra Bevels with [facility] who will see the patient and consult, recommends clear liquids only, hold anticoagulation. Hospitalist consulted for admission. Patient updated findings of studies and recommendation for admission and she is in agreement with treatment plan.  Final Clinical Impressions(s) / ED Diagnoses   Final diagnoses:  None    ED Discharge Orders    None       Quintella Reichert, MD 07/14/18 1827

## 2018-07-14 NOTE — ED Notes (Signed)
ED TO INPATIENT HANDOFF REPORT  Name/Age/Gender [name] 52 y.o. female  Code Status Code Status History    Date Active Date Inactive Code Status Order ID Comments User Context   04/07/2018 2002 04/16/2018 1816 Full Code [id number]  [name] Inpatient   08/03/2017 0910 08/13/2017 2107 Full Code [id number]  [name], MD Inpatient   11/12/2015 0214 11/21/2015 1709 Full Code [id number]  [name], MD Inpatient   10/04/2015 0027 10/06/2015 1721 Full Code [id number]  [name], MD ED   12/06/2013 2352 12/10/2013 1433 Full Code [id number]  [name], MD ED   11/03/2013 0019 11/07/2013 1923 Full Code [id number]  [name], MD Inpatient   06/14/2012 2322 06/15/2012 2032 Full Code [id number]  [name], MD ED      Home/SNF/Other Home  Chief Complaint blood in stool/nausea  Level of Care/Admitting Diagnosis ED Disposition    ED Disposition Condition [place]: [facility] [100102]  Level of Care: Telemetry [5]  Admit to tele based on following criteria: Other see comments  Comments: gib  Diagnosis: GIB (gastrointestinal bleeding) [696789]  Admitting Physician: Shelbie Proctor [[id number]]  Attending Physician: Shelbie Proctor [[id number]]  Estimated length of stay: past midnight tomorrow  Certification:: I certify this patient will need inpatient services for at least 2 midnights  PT Class (Do Not Modify): Inpatient [101]  PT Acc Code (Do Not Modify): Private [1]       Medical History Past Medical History:  Diagnosis Date  . Acute on chronic diastolic congestive heart failure ([place]) 11/02/2013   10/03/2015, 11/13/2015, 08/03/2017  . Benign essential HTN 11/28/2013  . Bipolar disease, chronic ([place])   . Chest pain    a. 2012 Myoview: EF 63%, no isch/infarct;  b. 04/2016 Lexiscan MV: EF 73%, no ischemia/infarct-->Low risk.  . Chronic diastolic CHF (congestive heart failure) ([place]) 07/23/2011   a. 2015  Echo: EF 55-60%, Gr2 DD;  b. 09/2015 Echo: EF 60-65%, no rwma, mod dil LA, PASP 85mHg.  . Cor pulmonale (chronic) ([facility]   . History of thyrotoxicosis   . HTN (hypertension) 11/28/2013  . Hypertensive heart disease 10/18/2013  . Insulin dependent type 2 diabetes mellitus, uncontrolled ([facility] [place]   . Mediastinal adenopathy   . Morbid obesity due to excess calories ([place] 02/19/2011  . Morbid obesity with BMI of 50.0-59.9, adult ([facility]   . OSA (obstructive sleep apnea) 03/06/2011  . Persistent atrial fibrillation 12/09/2017  . Pulmonary HTN, moderate to severe 11/03/2013  . Sinusitis, chronic 01/02/2015  . SVT (supraventricular tachycardia) ([facility] 12/06/2013  . Uncontrolled type 2 diabetes mellitus with hyperglycemia (HCC)     Allergies Allergies  Allergen Reactions  . Acetaminophen Other (See Comments)    Seizure-like "fits" as a child  . Caffeine     Tense, anxiety, increased urination  . FIran[Dapagliflozin] Other (See Comments)    Hallucinations, drop in blood sugar  . Lisinopril Rash    Rash with lisinopril; but fosinopril is ok per patient    IV Location/Drains/Wounds Patient Lines/Drains/Airways Status   Active Line/Drains/Airways    Name:   Placement date:   Placement time:   Site:   Days:   Peripheral IV 07/14/18 Left Antecubital   07/14/18    1831    Antecubital   less than 1          Labs/Imaging Results for orders placed or performed during the hospital encounter of 07/14/18 (from the past 48 hour(s))  Type  and screen [place]     Status: None   Collection Time: 07/14/18  3:44 PM  Result Value Ref Range   ABO/RH(D) AB POS    Antibody Screen NEG    Sample Expiration      07/17/2018 Performed at [facility], [place] [address] [place], [place] [address]   Comprehensive metabolic panel     Status: Abnormal   Collection Time: 07/14/18  3:45 PM  Result Value Ref Range   Sodium 141 135 - 145 mmol/L   Potassium 3.8 3.5 - 5.1 mmol/L    Chloride 104 98 - 111 mmol/L   CO2 23 22 - 32 mmol/L   Glucose, Bld 257 (H) 70 - 99 mg/dL   BUN 11 6 - 20 mg/dL   Creatinine, Ser 0.87 0.44 - 1.00 mg/dL   Calcium 9.1 8.9 - 10.3 mg/dL   Total Protein 7.5 6.5 - 8.1 g/dL   Albumin 3.6 3.5 - 5.0 g/dL   AST 26 15 - 41 U/L   ALT 14 0 - 44 U/L   Alkaline Phosphatase 98 38 - 126 U/L   Total Bilirubin 0.7 0.3 - 1.2 mg/dL   GFR calc non Af Amer >60 >60 mL/min   GFR calc Af Amer >60 >60 mL/min    Comment: (NOTE) The eGFR has been calculated using the CKD EPI equation. This calculation has not been validated in all clinical situations. eGFR's persistently <60 mL/min signify possible Chronic Kidney Disease.    Anion gap 14 5 - 15    Comment: Performed at [facility], [place] [address], [place], [place] [address]  CBC     Status: Abnormal   Collection Time: 07/14/18  3:45 PM  Result Value Ref Range   WBC 6.6 4.0 - 10.5 K/uL   RBC 3.96 3.87 - 5.11 MIL/uL   Hemoglobin 10.6 (L) 12.0 - 15.0 g/dL   HCT 34.9 (L) 36.0 - 46.0 %   MCV 88.1 80.0 - 100.0 fL   MCH 26.8 26.0 - 34.0 pg   MCHC 30.4 30.0 - 36.0 g/dL   RDW 15.7 (H) 11.5 - 15.5 %   Platelets 280 150 - 400 K/uL   nRBC 0.0 0.0 - 0.2 %    Comment: Performed at [facility], [place] [address], [place], [place] [address]  I-Stat beta hCG blood, ED     Status: None   Collection Time: 07/14/18  3:52 PM  Result Value Ref Range   I-stat hCG, quantitative <5.0 <5 mIU/mL   Comment 3            Comment:   GEST. AGE      CONC.  (mIU/mL)   <=1 WEEK        5 - 50     2 WEEKS       50 - 500     3 WEEKS       100 - 10,000     4 WEEKS     1,000 - 30,000        FEMALE AND NON-PREGNANT FEMALE:     LESS THAN 5 mIU/mL    No results found.  Pending Labs Unresulted Labs (From admission, onward)    Start     Ordered   07/14/18 1828  Iron and TIBC  Once,   R     07/14/18 1828   07/14/18 1828  Ferritin  Once,   R     07/14/18 1828  07/14/18 1828  Folate  Once,   R      07/14/18 1828   07/14/18 1828  Vitamin B12  Once,   R     07/14/18 1828   07/14/18 1828  Transferrin  Once,   R     07/14/18 1828   07/14/18 1544  ABO/Rh  Once,   R     07/14/18 1544          Vitals/Pain Today's Vitals   07/14/18 1510 07/14/18 1521 07/14/18 1751 07/14/18 1830  BP: (!) 132/102  (!) 140/93 138/86  Pulse: 91  88 78  Resp: '20  16 17  ' Temp: 98.6 F (37 C)     TempSrc: Oral     SpO2: 93%  93% 90%  Weight: (!) 160.1 kg     Height: '5\' 7"'  (1.702 m)     PainSc:  7       Isolation Precautions No active isolations  Medications Medications - No data to display  Mobility walks

## 2018-07-14 NOTE — ED Notes (Signed)
ED Provider at bedside. 

## 2018-07-15 ENCOUNTER — Encounter (HOSPITAL_COMMUNITY): Payer: Self-pay

## 2018-07-15 LAB — GLUCOSE, CAPILLARY
GLUCOSE-CAPILLARY: 311 mg/dL — AB (ref 70–99)
GLUCOSE-CAPILLARY: 185 mg/dL — AB (ref 70–99)
Glucose-Capillary: 214 mg/dL — ABNORMAL HIGH (ref 70–99)
Glucose-Capillary: 186 mg/dL — ABNORMAL HIGH (ref 70–99)

## 2018-07-15 LAB — TRANSFERRIN: Transferrin: 270 mg/dL (ref 192–382)

## 2018-07-15 LAB — HEMOGLOBIN AND HEMATOCRIT, BLOOD
HCT: 31.3 % — ABNORMAL LOW (ref 36.0–46.0)
HCT: 32 % — ABNORMAL LOW (ref 36.0–46.0)
HEMATOCRIT: 30.4 % — AB (ref 36.0–46.0)
Hemoglobin: 9.5 g/dL — ABNORMAL LOW (ref 12.0–15.0)
Hemoglobin: 9.5 g/dL — ABNORMAL LOW (ref 12.0–15.0)
Hemoglobin: 9.3 g/dL — ABNORMAL LOW (ref 12.0–15.0)

## 2018-07-15 LAB — IRON AND TIBC
IRON: 112 ug/dL (ref 28–170)
SATURATION RATIOS: 30 % (ref 10.4–31.8)
TIBC: 378 ug/dL (ref 250–450)
UIBC: 266 ug/dL

## 2018-07-15 LAB — OCCULT BLOOD X 1 CARD TO LAB, STOOL: FECAL OCCULT BLD: NEGATIVE

## 2018-07-15 LAB — FERRITIN: Ferritin: 49 ng/mL (ref 11–307)

## 2018-07-15 LAB — VITAMIN B12: Vitamin B-12: 141 pg/mL — ABNORMAL LOW (ref 180–914)

## 2018-07-15 LAB — ABO/RH: ABO/RH(D): AB POS

## 2018-07-15 LAB — FOLATE: FOLATE: 8.6 ng/mL (ref >5.9)

## 2018-07-15 MED ORDER — PEG-KCL-NACL-NASULF-NA ASC-C 100 G PO SOLR: 1.0000 | Freq: Once | ORAL | Status: DC

## 2018-07-15 MED ORDER — BISACODYL 5 MG PO TBEC
10.0000 mg | DELAYED_RELEASE_TABLET | Freq: Once | ORAL | Status: AC
  Administered 2018-07-15: 10 mg via ORAL
  Filled 2018-07-15: qty 2

## 2018-07-15 MED ORDER — PEG-KCL-NACL-NASULF-NA ASC-C 100 G PO SOLR
0.5000 | Freq: Once | ORAL | Status: AC
  Administered 2018-07-16: 0.5 via ORAL
  Filled 2018-07-15: qty 1

## 2018-07-15 MED ORDER — PEG-KCL-NACL-NASULF-NA ASC-C 100 G PO SOLR
0.5000 | Freq: Once | ORAL | Status: AC
  Administered 2018-07-15: 100 g via ORAL
  Filled 2018-07-15: qty 1

## 2018-07-15 NOTE — Plan of Care (Signed)
  Problem: Education: Goal: Knowledge of General Education information will improve Description: Including pain rating scale, medication(s)/side effects and non-pharmacologic comfort measures Outcome: [place]ing   Problem: Health Behavior/Discharge Planning: Goal: Ability to manage health-related needs will improve Outcome: [place]ing   Problem: Clinical Measurements: Goal: Diagnostic test results will improve Outcome: [place]ing   Problem: Coping: Goal: Level of anxiety will decrease Outcome: [place]ing   

## 2018-07-15 NOTE — Consult Note (Signed)
[facility] Gastroenterology Consultation Note  Referring Provider: Dr. Quincy Simmonds [facility]) Primary Care Physician:  [name], NP  Reason for Consultation:  hematochezia  HPI: [name] is a 52 y.o. female admitted for hematochezia.  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  She began having blood in stool, bright red, a couple days ago.  Few months ago started on rivaroxaban for atrial fibrillation.  Has some lower abdominal cramps, but no abdominal pain.  No melena or hematemesis.  No prior GI bleeding.  No prior colonoscopy. No GERD or dysphagia.  Has chronic looser stools in setting of metformin.   Past Medical History:  Diagnosis Date  . Acute on chronic diastolic congestive heart failure ([place]) 11/02/2013   10/03/2015, 11/13/2015, 08/03/2017  . Benign essential HTN 11/28/2013  . Bipolar disease, chronic ([place])   . Chest pain    a. 2012 Myoview: EF 63%, no isch/infarct;  b. 04/2016 Lexiscan MV: EF 73%, no ischemia/infarct-->Low risk.  . Chronic diastolic CHF (congestive heart failure) ([place]) 07/23/2011   a. 2015 Echo: EF 55-60%, Gr2 DD;  b. 09/2015 Echo: EF 60-65%, no rwma, mod dil LA, PASP 52mmHg.  . Cor pulmonale (chronic) ([place])   . History of thyrotoxicosis   . HTN (hypertension) 11/28/2013  . Hypertensive heart disease 10/18/2013  . Hypoglycemia   . Insulin dependent type 2 diabetes mellitus, uncontrolled ([place])   . Mediastinal adenopathy   . Morbid obesity due to excess calories ([place]) 02/19/2011  . Morbid obesity with BMI of 50.0-59.9, adult ([place])   . OSA (obstructive sleep apnea) 03/06/2011  . Persistent atrial fibrillation 12/09/2017  . Pulmonary HTN, moderate to severe 11/03/2013  . Sinusitis, chronic 01/02/2015  . SVT (supraventricular tachycardia) ([place]) 12/06/2013  . Uncontrolled type 2 diabetes mellitus with hyperglycemia [facility])     Past Surgical History:  Procedure Laterality Date  .  CARDIOVERSION N/A 04/05/2018   Procedure: CARDIOVERSION;  Surgeon: Lelon Perla, MD;  Location: [facility] ENDOSCOPY;  Service: Cardiovascular;  Laterality: N/A;  . None      Prior to Admission medications   Medication Sig Start Date End Date Taking? Authorizing Provider  albuterol (VENTOLIN HFA) 108 (90 Base) MCG/ACT inhaler INHALE 1 TO 2 PUFFS EVERY 6 HOURS AS NEEDED FOR WHEEZING/ SHORTNESS OF BREATH 06/21/18  Yes [name], NP  carvedilol (COREG) 25 MG tablet TAKE 1 TABLET BY MOUTH 2 TIMES DAILY. 04/18/18  Yes Lelon Perla, MD  diltiazem (CARDIZEM CD) 240 MG 24 hr capsule Take 1 capsule (240 mg total) by mouth daily. 06/14/18 09/12/18 Yes Barrett, Evelene Croon, PA-C  ferrous sulfate 325 (65 FE) MG tablet Take 1 tablet (325 mg total) by mouth 2 (two) times daily. 04/15/18  Yes Bhagat, Bhavinkumar, PA  furosemide (LASIX) 40 MG tablet Take 1 tablet (40 mg total) by mouth 2 (two) times daily. 04/16/18  Yes Isaiah Serge, NP  glipiZIDE (GLIPIZIDE XL) 10 MG 24 hr tablet Take 1 tablet (10 mg total) by mouth daily with breakfast. 06/21/18  Yes [name], NP  glucose blood test strip Use as instructed. Check blood glucose levels by fingerstick twice per day 06/21/18  Yes [name], NP  losartan (COZAAR) 100 MG tablet Take 1 tablet (100 mg total) by mouth daily. 06/21/18 09/19/18 Yes [name], NP  metFORMIN (GLUCOPHAGE) 1000 MG tablet Take 1 tablet by mouth 2 (two) times daily with a meal. 06/21/18  Yes [provider]  oxybutynin (DITROPAN XL) 15 MG 24 hr tablet Take 1 tablet (15 mg total) by mouth at bedtime. 06/21/18  Yes [name], NP  potassium chloride SA (K-DUR,KLOR-CON) 20 MEQ tablet Take 1 tablet (20 mEq total) by mouth 2 (two) times daily. 04/15/18  Yes Bhagat, Bhavinkumar, PA  risperiDONE (RISPERDAL) 2 MG tablet Take 1 tablet (2 mg total) by mouth daily. 05/27/18  Yes [name], NP  rivaroxaban (XARELTO) 20 MG TABS tablet Take 1 tablet (20 mg total) by  mouth daily with supper. 03/21/18 07/14/18 Yes [name], NP  sitaGLIPtin-metformin (JANUMET) 50-1000 MG tablet Take 1 tablet by mouth 2 (two) times daily with a meal. Patient not taking: Reported on 07/14/2018 06/21/18   [name], NP    Current Facility-Administered Medications  Medication Dose Route Frequency Provider Last Rate Last Dose  . albuterol (PROVENTIL) (2.5 MG/3ML) 0.083% nebulizer solution 2.5 mg  2.5 mg Nebulization Q6H PRN Johnson-Pitts, Endia, MD      . carvedilol (COREG) tablet 25 mg  25 mg Oral BID Johnson-Pitts, Endia, MD   25 mg at 07/15/18 1103  . diltiazem (CARDIZEM CD) 24 hr capsule 240 mg  240 mg Oral Daily Johnson-Pitts, Endia, MD   240 mg at 07/15/18 1102  . ferrous sulfate tablet 325 mg  325 mg Oral BID Johnson-Pitts, Endia, MD   325 mg at 07/15/18 1102  . furosemide (LASIX) tablet 40 mg  40 mg Oral BID Johnson-Pitts, Endia, MD   40 mg at 07/15/18 0840  . insulin aspart (novoLOG) injection 0-9 Units  0-9 Units Subcutaneous TID WC Johnson-Pitts, Endia, MD   3 Units at 07/15/18 1234  . losartan (COZAAR) tablet 100 mg  100 mg Oral Daily Johnson-Pitts, Endia, MD   100 mg at 07/15/18 1103  . ondansetron (ZOFRAN) tablet 4 mg  4 mg Oral Q6H PRN Johnson-Pitts, Endia, MD       Or  . ondansetron (ZOFRAN) injection 4 mg  4 mg Intravenous Q6H PRN Johnson-Pitts, Endia, MD      . oxybutynin (DITROPAN-XL) 24 hr tablet 15 mg  15 mg Oral QHS Johnson-Pitts, Endia, MD   15 mg at 07/14/18 2209  . polyethylene glycol (MIRALAX / GLYCOLAX) packet 17 g  17 g Oral Daily PRN Johnson-Pitts, Endia, MD      . potassium chloride SA (K-DUR,KLOR-CON) CR tablet 20 mEq  20 mEq Oral BID Johnson-Pitts, Endia, MD   20 mEq at 07/15/18 1102  . risperiDONE (RISPERDAL) tablet 2 mg  2 mg Oral Daily Johnson-Pitts, Endia, MD   2 mg at 07/15/18 1102    Allergies as of 07/14/2018 - Review Complete 07/14/2018  Allergen Reaction Noted  . Acetaminophen Other (See Comments) 12/22/2010  . Caffeine   12/22/2010  . Wilder Glade [dapagliflozin] Other (See Comments) 08/03/2017  . Lisinopril Rash 02/26/2011    Family History  Problem Relation Age of Onset  . Heart failure Father   . Stroke Father   . Hypertension Mother   . Heart disease Maternal Grandfather     Social History   Socioeconomic History  . Marital status: Single    Spouse name: Not on file  . Number of children: 0  . Years of education: 57  . Highest education level: Not on file  Occupational History  . Occupation: unemployed  Social Needs  . Financial resource strain: Not hard at all  . Food insecurity:    Worry: Never true    Inability: Never  true  . Transportation needs:    Medical: No    Non-medical: No  Tobacco Use  . Smoking status: Never Smoker  . Smokeless tobacco: Never Used  Substance and Sexual Activity  . Alcohol use: No  . Drug use: No  . Sexual activity: Not Currently    Birth control/protection: None  Lifestyle  . Physical activity:    Days per week: Not on file    Minutes per session: Not on file  . Stress: Only a little  Relationships  . Social connections:    Talks on phone: Not on file    Gets together: Not on file    Attends religious service: Not on file    Active member of club or organization: Not on file    Attends meetings of clubs or organizations: Not on file    Relationship status: Not on file  . Intimate partner violence:    Fear of current or ex partner: Not on file    Emotionally abused: Not on file    Physically abused: Not on file    Forced sexual activity: Not on file  Other Topics Concern  . 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 But ran out of money and is no longer attending school. (Note patient has bipolar disorder).      Born in [place] but lived in [place] before coming back to [place].       Primary language is Arabic. Lives with mother and  brother.    Review of Systems: as per HPI, all others negative  Physical Exam: Vital signs in last 24 hours: Temp:  [97.5 F (36.4 C)-98.6 F (37 C)] 97.5 F (36.4 C) (10/18 0505) Pulse Rate:  [78-91] 81 (10/18 0505) Resp:  [16-20] 16 (10/18 0505) BP: (132-142)/(86-102) 142/93 (10/18 0505) SpO2:  [90 %-100 %] 96 % (10/18 0505) Weight:  [160.1 kg] 160.1 kg (10/17 1510) Last BM Date: 07/14/18 General:   Alert,  Obese, pleasant and cooperative in NAD Head:  Normocephalic and atraumatic. Eyes:  Sclera clear, no icterus.   Conjunctiva pink. Ears:  Normal auditory acuity. Nose:  No deformity, discharge,  or lesions. Mouth:  No deformity or lesions.  Oropharynx pink & moist. Neck:  Thick but Supple; no masses or thyromegaly. Lungs:  Diminished aeration throughout, otherwise clear throughout to auscultation.   No wheezes, crackles, or rhonchi. No acute distress. Heart:  Regular rate and rhythm; no murmurs, clicks, rubs,  or gallops. Abdomen:  Soft, protuberant, nontender and nondistended. No masses, hepatosplenomegaly or hernias noted. Normal bowel sounds, without guarding, and without rebound.     Msk:  Symmetrical without gross deformities. Normal posture. Pulses:  Normal pulses noted. Extremities:  Without clubbing or edema. Neurologic:  Alert and  oriented x4;  grossly normal neurologically. Skin:  Intact without significant lesions or rashes. Psych:  Alert and cooperative. Normal mood and affect.   Lab Results: Recent Labs    07/14/18 1545 07/15/18 0034 07/15/18 0811  WBC 6.6  --   --   HGB 10.6* 9.5* 9.5*  HCT 34.9* 31.3* 32.0*  PLT 280  --   --    BMET Recent Labs    07/14/18 1545  NA 141  K 3.8  CL 104  CO2 23  GLUCOSE 257*  BUN 11  CREATININE 0.87  CALCIUM 9.1   LFT Recent  Labs    07/14/18 1545  PROT 7.5  ALBUMIN 3.6  AST 26  ALT 14  ALKPHOS 98  BILITOT 0.7   PT/INR No results for input(s): LABPROT, INR in the last 72  hours.  Studies/Results: No results found.  Impression:  1.  Hematochezia, improving.  Diverticular most likely.  Other causes not excluded. 2.  Acute blood loss anemia. 3.  Chronic anticoagulation (rivaroxaban for atrial fibrillation). 4.  Multiple medical problems (heart failure, diabetes, obesity).  Plan:  1.  Clear liquids today. 2.  Hold rivaroxaban. 3.  Follow CBCs. 4.  Colonoscopy tomorrow. 5.  Risks (bleeding, infection, bowel perforation that could require surgery, sedation-related changes in cardiopulmonary systems), benefits (identification and possible treatment of source of symptoms, exclusion of certain causes of symptoms), and alternatives (watchful waiting, radiographic imaging studies, empiric medical treatment) of colonoscopy were explained to patient/family in detail and patient wishes to proceed. 6.  Next step in management pending colonoscopy findings.   LOS: 1 day   Erikka Follmer M  07/15/2018, 1:16 PM  Cell [phone] If no answer or after 5 PM call [phone]

## 2018-07-15 NOTE — Progress Notes (Addendum)
Inpatient Diabetes Program Recommendations  AACE/ADA: New Consensus Statement on Inpatient Glycemic Control (2015)  Target Ranges:  Prepandial:   less than 140 mg/dL      Peak postprandial:   less than 180 mg/dL (1-2 hours)      Critically ill patients:  140 - 180 mg/dL   Lab Results  Component Value Date   GLUCAP 311 (H) 07/15/2018   HGBA1C 9.2 (A) 06/21/2018    Review of Glycemic Control Results for [name], [name] (MRN [id number]) as of 07/15/2018 11:21  Ref. Range 07/15/2018 07:48  Glucose-Capillary Latest Ref Range: 70 - 99 mg/dL 311 (H)   Diabetes history: Type 2 DM  Outpatient Diabetes medications:  Glipzide 10 mg daily, Metformin 1000 mg bid Current orders for Inpatient glycemic control:  Novolog sensitive tid with meals Inpatient Diabetes Program Recommendations:   Note admission for GI bleed.  Last A1C >goal however per MD note, " she has had hypoglycemic coma  in the past and her doctors have instructed her to no longer take long acting insulin and to have permissive hyperglycemia." While in the hospital, consider adding low dose basal insulin such as Levemir 15 units daily.  Also if NPO, will need Novolog correction q 4 hours.   Thanks,  [name], RN, BC-ADM Inpatient Diabetes Coordinator Pager [phone] ([phone] Addendum:  Briefly spoke to patient regarding diabetes.  She states that 2 year ago she had a severe hypoglycemic coma from being on insulin.  We discussed most recent A1C.  Encouraged f/u with PCP and explained that while in the hospital, insulin will be used to control blood sugars.  Patient asked "will my diabetes go away?"  Explained that she could improve her blood sugars with lifestyle modifications and medications, however the disease does not usually go away.  Patient verbalized understanding.  Will follow.

## 2018-07-15 NOTE — Progress Notes (Signed)
PROGRESS NOTE [facility]   [place] [name]   XFG:[id number] DOB: [birth year]  DOA: 07/14/2018 PCP: [name], NP   Brief Narrative:  [name] is a 52 year old female with medical history significant for diabetes mellitus type 2, CHF, A. fib, hypertension, morbid obesity and bipolar disorder.  Patient presented to the emergency complaining of bright red blood per rectum started 2 days prior to admission.  Upon ED evaluation rectal exam with no frank blood, hemoglobin down from 11.9 to 10.6.  GI was consulted and patient was admitted with working diagnosis of possible lower GI bleed.  Subjective: Patient seen and examined, report to continues to have bloody bowel movement, complaining of left lower quadrant abdominal pain.  Denies nausea, vomiting diarrhea.  No acute events, afebrile.  Assessment & Plan: Hematochezia In setting of anticoagulation, differential diagnosis includes internal hemorrhoids versus diverticular bleed. Hemoglobin dropping down.  Hold Xarelto.  GI has been consulted and planning to perform colonoscopy on 10/19.  Continue to monitor CBC every 6 hours. FOBT pending   Acute blood loss anemia From lower GI bleed.  Monitor CBC every 6 hour, transfuse if hemoglobin less than 7.  Persistent A. fib Rate well controlled, continue Cardizem.  Holding anticoagulation due to GI bleed.  Hypertension BP stable, continue Cardizem and losartan  B12 deficiency Replete with IM x3 days and then once a week for 6 weeks.  Diabetes type 2 with uncontrolled hyperglycemia Per patient in the past had hypoglycemic, so she has been instructed to not take long-acting insulin and to allow permissive hyperglycemia.  She is on oral hypoglycemic agents, these are on hold for now.  Continue sliding scale.  Monitor CBGs  Chronic diastolic CHF No signs of fluid overload, continue to monitor  Morbid obesity   DVT prophylaxis: SCDs Code Status: Full code Family  Communication: Family at bedside Disposition Plan: Home when cleared by GI   Consultants:   GI   Procedures:   None   Antimicrobials:  None    Objective: Vitals:   07/14/18 1900 07/14/18 2006 07/15/18 0505 07/15/18 1400  BP: (!) 139/99 137/88 (!) 142/93 116/62  Pulse: 85 78 81 75  Resp:  '17 16 17  ' Temp:  98.3 F (36.8 C) (!) 97.5 F (36.4 C) 97.9 F (36.6 C)  TempSrc:  Oral  Oral  SpO2: 98% 100% 96% 100%  Weight:      Height:        Intake/Output Summary (Last 24 hours) at 07/15/2018 1552 Last data filed at 07/15/2018 1328 Gross per 24 hour  Intake 1320 ml  Output 100 ml  Net 1220 ml   Filed Weights   07/14/18 1510  Weight: (!) 160.1 kg    Examination:  General exam: Appears calm and comfortable  HEENT: OP moist and clear Respiratory system: Clear to auscultation. No wheezes,crackle or rhonchi Cardiovascular system: S1 & S2 heard, RRR. No JVD, murmurs, rubs or gallops Gastrointestinal system: Abdomen obese, soft LLQ tenderness, +BS  Central nervous system: Alert and oriented. No focal neurological deficits. Extremities: No pedal edema.  Skin: No rashes Psychiatry: Mood & affect appropriate.    Data Reviewed: I have personally reviewed following labs and imaging studies  CBC: Recent Labs  Lab 07/14/18 1545 07/15/18 0034 07/15/18 0811 07/15/18 1410  WBC 6.6  --   --   --   HGB 10.6* 9.5* 9.5* 9.3*  HCT 34.9* 31.3* 32.0* 30.4*  MCV 88.1  --   --   --   PLT  280  --   --   --    Basic Metabolic Panel: Recent Labs  Lab 07/14/18 1545  NA 141  K 3.8  CL 104  CO2 23  GLUCOSE 257*  BUN 11  CREATININE 0.87  CALCIUM 9.1   GFR: Estimated Creatinine Clearance: 123.3 mL/min (by C-G formula based on SCr of 0.87 mg/dL). Liver Function Tests: Recent Labs  Lab 07/14/18 1545  AST 26  ALT 14  ALKPHOS 98  BILITOT 0.7  PROT 7.5  ALBUMIN 3.6   No results for input(s): LIPASE, AMYLASE in the last 168 hours. No results for input(s): AMMONIA in  the last 168 hours. Coagulation Profile: No results for input(s): INR, PROTIME in the last 168 hours. Cardiac Enzymes: No results for input(s): CKTOTAL, CKMB, CKMBINDEX, TROPONINI in the last 168 hours. BNP (last 3 results) No results for input(s): PROBNP in the last 8760 hours. HbA1C: No results for input(s): HGBA1C in the last 72 hours. CBG: Recent Labs  Lab 07/15/18 0748 07/15/18 1149  GLUCAP 311* 214*   Lipid Profile: No results for input(s): CHOL, HDL, LDLCALC, TRIG, CHOLHDL, LDLDIRECT in the last 72 hours. Thyroid Function Tests: No results for input(s): TSH, T4TOTAL, FREET4, T3FREE, THYROIDAB in the last 72 hours. Anemia Panel: Recent Labs    07/15/18 0034  VITAMINB12 141*  FOLATE 8.6  FERRITIN 49  TIBC 378  IRON 112   Sepsis Labs: No results for input(s): PROCALCITON, LATICACIDVEN in the last 168 hours.  No results found for this or any previous visit (from the past 240 hour(s)).    Radiology Studies: No results found.    Scheduled Meds: . bisacodyl  10 mg Oral Once  . carvedilol  25 mg Oral BID  . diltiazem  240 mg Oral Daily  . ferrous sulfate  325 mg Oral BID  . furosemide  40 mg Oral BID  . insulin aspart  0-9 Units Subcutaneous TID WC  . losartan  100 mg Oral Daily  . oxybutynin  15 mg Oral QHS  . peg 3350 powder  0.5 kit Oral Once   And  . [START ON 07/16/2018] peg 3350 powder  0.5 kit Oral Once  . potassium chloride SA  20 mEq Oral BID  . risperiDONE  2 mg Oral Daily   Continuous Infusions:   LOS: 1 day    Time spent: Total of 25 minutes spent with pt, greater than 50% of which was spent in discussion of  treatment, counseling and coordination of care   Chipper Oman, MD Pager: Text Page via [email or web address]   If 7PM-7AM, please contact night-coverage [email or web address] 07/15/2018, 3:52 PM   Note - This record has been created using [employer]. Chart creation errors have been sought, but may not always have been located. Such creation  errors do not reflect on the standard of medical care.

## 2018-07-15 NOTE — H&P (View-Only) (Signed)
[facility] Gastroenterology Consultation Note  Referring Provider: Dr. Quincy Simmonds [facility]) Primary Care Physician:  [name], NP  Reason for Consultation:  hematochezia  HPI: [name] is a 52 y.o. female admitted for hematochezia.  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  She began having blood in stool, bright red, a couple days ago.  Few months ago started on rivaroxaban for atrial fibrillation.  Has some lower abdominal cramps, but no abdominal pain.  No melena or hematemesis.  No prior GI bleeding.  No prior colonoscopy. No GERD or dysphagia.  Has chronic looser stools in setting of metformin.   Past Medical History:  Diagnosis Date  . Acute on chronic diastolic congestive heart failure ([place]) 11/02/2013   10/03/2015, 11/13/2015, 08/03/2017  . Benign essential HTN 11/28/2013  . Bipolar disease, chronic ([place])   . Chest pain    a. 2012 Myoview: EF 63%, no isch/infarct;  b. 04/2016 Lexiscan MV: EF 73%, no ischemia/infarct-->Low risk.  . Chronic diastolic CHF (congestive heart failure) ([place]) 07/23/2011   a. 2015 Echo: EF 55-60%, Gr2 DD;  b. 09/2015 Echo: EF 60-65%, no rwma, mod dil LA, PASP 99mmHg.  . Cor pulmonale (chronic) ([place])   . History of thyrotoxicosis   . HTN (hypertension) 11/28/2013  . Hypertensive heart disease 10/18/2013  . Hypoglycemia   . Insulin dependent type 2 diabetes mellitus, uncontrolled ([place])   . Mediastinal adenopathy   . Morbid obesity due to excess calories ([place]) 02/19/2011  . Morbid obesity with BMI of 50.0-59.9, adult ([place])   . OSA (obstructive sleep apnea) 03/06/2011  . Persistent atrial fibrillation 12/09/2017  . Pulmonary HTN, moderate to severe 11/03/2013  . Sinusitis, chronic 01/02/2015  . SVT (supraventricular tachycardia) ([place]) 12/06/2013  . Uncontrolled type 2 diabetes mellitus with hyperglycemia [facility])     Past Surgical History:  Procedure Laterality Date  .  CARDIOVERSION N/A 04/05/2018   Procedure: CARDIOVERSION;  Surgeon: Lelon Perla, MD;  Location: [facility] ENDOSCOPY;  Service: Cardiovascular;  Laterality: N/A;  . None      Prior to Admission medications   Medication Sig Start Date End Date Taking? Authorizing Provider  albuterol (VENTOLIN HFA) 108 (90 Base) MCG/ACT inhaler INHALE 1 TO 2 PUFFS EVERY 6 HOURS AS NEEDED FOR WHEEZING/ SHORTNESS OF BREATH 06/21/18  Yes [name], NP  carvedilol (COREG) 25 MG tablet TAKE 1 TABLET BY MOUTH 2 TIMES DAILY. 04/18/18  Yes Lelon Perla, MD  diltiazem (CARDIZEM CD) 240 MG 24 hr capsule Take 1 capsule (240 mg total) by mouth daily. 06/14/18 09/12/18 Yes Barrett, Evelene Croon, PA-C  ferrous sulfate 325 (65 FE) MG tablet Take 1 tablet (325 mg total) by mouth 2 (two) times daily. 04/15/18  Yes Bhagat, Bhavinkumar, PA  furosemide (LASIX) 40 MG tablet Take 1 tablet (40 mg total) by mouth 2 (two) times daily. 04/16/18  Yes Isaiah Serge, NP  glipiZIDE (GLIPIZIDE XL) 10 MG 24 hr tablet Take 1 tablet (10 mg total) by mouth daily with breakfast. 06/21/18  Yes [name], NP  glucose blood test strip Use as instructed. Check blood glucose levels by fingerstick twice per day 06/21/18  Yes [name], NP  losartan (COZAAR) 100 MG tablet Take 1 tablet (100 mg total) by mouth daily. 06/21/18 09/19/18 Yes [name], NP  metFORMIN (GLUCOPHAGE) 1000 MG tablet Take 1 tablet by mouth 2 (two) times daily with a meal. 06/21/18  Yes [provider]  oxybutynin (DITROPAN XL) 15 MG 24 hr tablet Take 1 tablet (15 mg total) by mouth at bedtime. 06/21/18  Yes [name], NP  potassium chloride SA (K-DUR,KLOR-CON) 20 MEQ tablet Take 1 tablet (20 mEq total) by mouth 2 (two) times daily. 04/15/18  Yes Bhagat, Bhavinkumar, PA  risperiDONE (RISPERDAL) 2 MG tablet Take 1 tablet (2 mg total) by mouth daily. 05/27/18  Yes [name], NP  rivaroxaban (XARELTO) 20 MG TABS tablet Take 1 tablet (20 mg total) by  mouth daily with supper. 03/21/18 07/14/18 Yes [name], NP  sitaGLIPtin-metformin (JANUMET) 50-1000 MG tablet Take 1 tablet by mouth 2 (two) times daily with a meal. Patient not taking: Reported on 07/14/2018 06/21/18   [name], NP    Current Facility-Administered Medications  Medication Dose Route Frequency Provider Last Rate Last Dose  . albuterol (PROVENTIL) (2.5 MG/3ML) 0.083% nebulizer solution 2.5 mg  2.5 mg Nebulization Q6H PRN Johnson-Pitts, Endia, MD      . carvedilol (COREG) tablet 25 mg  25 mg Oral BID Johnson-Pitts, Endia, MD   25 mg at 07/15/18 1103  . diltiazem (CARDIZEM CD) 24 hr capsule 240 mg  240 mg Oral Daily Johnson-Pitts, Endia, MD   240 mg at 07/15/18 1102  . ferrous sulfate tablet 325 mg  325 mg Oral BID Johnson-Pitts, Endia, MD   325 mg at 07/15/18 1102  . furosemide (LASIX) tablet 40 mg  40 mg Oral BID Johnson-Pitts, Endia, MD   40 mg at 07/15/18 0840  . insulin aspart (novoLOG) injection 0-9 Units  0-9 Units Subcutaneous TID WC Johnson-Pitts, Endia, MD   3 Units at 07/15/18 1234  . losartan (COZAAR) tablet 100 mg  100 mg Oral Daily Johnson-Pitts, Endia, MD   100 mg at 07/15/18 1103  . ondansetron (ZOFRAN) tablet 4 mg  4 mg Oral Q6H PRN Johnson-Pitts, Endia, MD       Or  . ondansetron (ZOFRAN) injection 4 mg  4 mg Intravenous Q6H PRN Johnson-Pitts, Endia, MD      . oxybutynin (DITROPAN-XL) 24 hr tablet 15 mg  15 mg Oral QHS Johnson-Pitts, Endia, MD   15 mg at 07/14/18 2209  . polyethylene glycol (MIRALAX / GLYCOLAX) packet 17 g  17 g Oral Daily PRN Johnson-Pitts, Endia, MD      . potassium chloride SA (K-DUR,KLOR-CON) CR tablet 20 mEq  20 mEq Oral BID Johnson-Pitts, Endia, MD   20 mEq at 07/15/18 1102  . risperiDONE (RISPERDAL) tablet 2 mg  2 mg Oral Daily Johnson-Pitts, Endia, MD   2 mg at 07/15/18 1102    Allergies as of 07/14/2018 - Review Complete 07/14/2018  Allergen Reaction Noted  . Acetaminophen Other (See Comments) 12/22/2010  . Caffeine   12/22/2010  . [place] [dapagliflozin] Other (See Comments) 08/03/2017  . Lisinopril Rash 02/26/2011    Family History  Problem Relation Age of Onset  . Heart failure Father   . Stroke Father   . Hypertension Mother   . Heart disease Maternal Grandfather     Social History   Socioeconomic History  . Marital status: Single    Spouse name: Not on file  . Number of children: 0  . Years of education: 35  . Highest education level: Not on file  Occupational History  . Occupation: unemployed  Social Needs  . Financial resource strain: Not hard at all  . Food insecurity:    Worry: Never true    Inability: Never  true  . Transportation needs:    Medical: No    Non-medical: No  Tobacco Use  . Smoking status: Never Smoker  . Smokeless tobacco: Never Used  Substance and Sexual Activity  . Alcohol use: No  . Drug use: No  . Sexual activity: Not Currently    Birth control/protection: None  Lifestyle  . Physical activity:    Days per week: Not on file    Minutes per session: Not on file  . Stress: Only a little  Relationships  . Social connections:    Talks on phone: Not on file    Gets together: Not on file    Attends religious service: Not on file    Active member of club or organization: Not on file    Attends meetings of clubs or organizations: Not on file    Relationship status: Not on file  . Intimate partner violence:    Fear of current or ex partner: Not on file    Emotionally abused: Not on file    Physically abused: Not on file    Forced sexual activity: Not on file  Other Topics Concern  . 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 But ran out of money and is no longer attending school. (Note patient has bipolar disorder).      Born in [place] but lived in [place] before coming back to [place].       Primary language is Arabic. Lives with mother and  brother.    Review of Systems: as per HPI, all others negative  Physical Exam: Vital signs in last 24 hours: Temp:  [97.5 F (36.4 C)-98.6 F (37 C)] 97.5 F (36.4 C) (10/18 0505) Pulse Rate:  [78-91] 81 (10/18 0505) Resp:  [16-20] 16 (10/18 0505) BP: (132-142)/(86-102) 142/93 (10/18 0505) SpO2:  [90 %-100 %] 96 % (10/18 0505) Weight:  [160.1 kg] 160.1 kg (10/17 1510) Last BM Date: 07/14/18 General:   Alert,  Obese, pleasant and cooperative in NAD Head:  Normocephalic and atraumatic. Eyes:  Sclera clear, no icterus.   Conjunctiva pink. Ears:  Normal auditory acuity. Nose:  No deformity, discharge,  or lesions. Mouth:  No deformity or lesions.  Oropharynx pink & moist. Neck:  Thick but Supple; no masses or thyromegaly. Lungs:  Diminished aeration throughout, otherwise clear throughout to auscultation.   No wheezes, crackles, or rhonchi. No acute distress. Heart:  Regular rate and rhythm; no murmurs, clicks, rubs,  or gallops. Abdomen:  Soft, protuberant, nontender and nondistended. No masses, hepatosplenomegaly or hernias noted. Normal bowel sounds, without guarding, and without rebound.     Msk:  Symmetrical without gross deformities. Normal posture. Pulses:  Normal pulses noted. Extremities:  Without clubbing or edema. Neurologic:  Alert and  oriented x4;  grossly normal neurologically. Skin:  Intact without significant lesions or rashes. Psych:  Alert and cooperative. Normal mood and affect.   Lab Results: Recent Labs    07/14/18 1545 07/15/18 0034 07/15/18 0811  WBC 6.6  --   --   HGB 10.6* 9.5* 9.5*  HCT 34.9* 31.3* 32.0*  PLT 280  --   --    BMET Recent Labs    07/14/18 1545  NA 141  K 3.8  CL 104  CO2 23  GLUCOSE 257*  BUN 11  CREATININE 0.87  CALCIUM 9.1   LFT Recent  Labs    07/14/18 1545  PROT 7.5  ALBUMIN 3.6  AST 26  ALT 14  ALKPHOS 98  BILITOT 0.7   PT/INR No results for input(s): LABPROT, INR in the last 72  hours.  Studies/Results: No results found.  Impression:  1.  Hematochezia, improving.  Diverticular most likely.  Other causes not excluded. 2.  Acute blood loss anemia. 3.  Chronic anticoagulation (rivaroxaban for atrial fibrillation). 4.  Multiple medical problems (heart failure, diabetes, obesity).  Plan:  1.  Clear liquids today. 2.  Hold rivaroxaban. 3.  Follow CBCs. 4.  Colonoscopy tomorrow. 5.  Risks (bleeding, infection, bowel perforation that could require surgery, sedation-related changes in cardiopulmonary systems), benefits (identification and possible treatment of source of symptoms, exclusion of certain causes of symptoms), and alternatives (watchful waiting, radiographic imaging studies, empiric medical treatment) of colonoscopy were explained to patient/family in detail and patient wishes to proceed. 6.  Next step in management pending colonoscopy findings.   LOS: 1 day   Dejanique Ruehl M  07/15/2018, 1:16 PM  Cell [phone] If no answer or after 5 PM call [phone]

## 2018-07-16 ENCOUNTER — Encounter (HOSPITAL_COMMUNITY): Payer: Self-pay | Admitting: *Deleted

## 2018-07-16 ENCOUNTER — Inpatient Hospital Stay (HOSPITAL_COMMUNITY): Payer: Medicaid Other | Admitting: Certified Registered Nurse Anesthetist

## 2018-07-16 ENCOUNTER — Encounter (HOSPITAL_COMMUNITY)
Admission: EM | Disposition: A | Discharge status: Home / Self Care | Payer: Self-pay | Source: Home / Self Care | Attending: Family Medicine

## 2018-07-16 HISTORY — PX: COLONOSCOPY WITH PROPOFOL: SHX5780

## 2018-07-16 HISTORY — PX: POLYPECTOMY: SHX5525

## 2018-07-16 LAB — CBC WITH DIFFERENTIAL/PLATELET
ABS IMMATURE GRANULOCYTES: 0.04 10*3/uL (ref 0.00–0.07)
BASOS PCT: 1 %
Basophils Absolute: 0 10*3/uL (ref 0.0–0.1)
EOS PCT: 1 %
Eosinophils Absolute: 0.1 10*3/uL (ref 0.0–0.5)
HCT: 32.4 % — ABNORMAL LOW (ref 36.0–46.0)
HEMOGLOBIN: 9.7 g/dL — AB (ref 12.0–15.0)
Immature Granulocytes: 1 %
LYMPHS PCT: 27 %
Lymphs Abs: 1.8 10*3/uL (ref 0.7–4.0)
MCH: 26.9 pg (ref 26.0–34.0)
MCHC: 29.9 g/dL — AB (ref 30.0–36.0)
MCV: 90 fL (ref 80.0–100.0)
MONO ABS: 0.6 10*3/uL (ref 0.1–1.0)
Monocytes Relative: 9 %
NEUTROS ABS: 4 10*3/uL (ref 1.7–7.7)
Neutrophils Relative %: 61 %
Platelets: 231 10*3/uL (ref 150–400)
RBC: 3.6 MIL/uL — AB (ref 3.87–5.11)
RDW: 16 % — ABNORMAL HIGH (ref 11.5–15.5)
WBC: 6.5 10*3/uL (ref 4.0–10.5)
nRBC: 0 % (ref 0.0–0.2)

## 2018-07-16 LAB — BASIC METABOLIC PANEL
Anion gap: 9 (ref 5–15)
BUN: 8 mg/dL (ref 6–20)
CHLORIDE: 103 mmol/L (ref 98–111)
CO2: 25 mmol/L (ref 22–32)
Calcium: 8.5 mg/dL — ABNORMAL LOW (ref 8.9–10.3)
Creatinine, Ser: 0.63 mg/dL (ref 0.44–1.00)
GFR calc non Af Amer: >60 mL/min (ref >60)
Glucose, Bld: 207 mg/dL — ABNORMAL HIGH (ref 70–99)
POTASSIUM: 4 mmol/L (ref 3.5–5.1)
SODIUM: 137 mmol/L (ref 135–145)

## 2018-07-16 LAB — GLUCOSE, CAPILLARY
GLUCOSE-CAPILLARY: 202 mg/dL — AB (ref 70–99)
GLUCOSE-CAPILLARY: 183 mg/dL — AB (ref 70–99)

## 2018-07-16 SURGERY — COLONOSCOPY WITH PROPOFOL
Anesthesia: Monitor Anesthesia Care | Laterality: Left

## 2018-07-16 MED ORDER — POLYETHYLENE GLYCOL 3350 17 G PO PACK: 17.0000 g | PACK | Freq: Every day | ORAL | 0 refills | Status: DC | PRN

## 2018-07-16 MED ORDER — RIVAROXABAN 20 MG PO TABS: 20.0000 mg | ORAL_TABLET | Freq: Every day | ORAL | 0 refills | Status: DC

## 2018-07-16 MED ORDER — CYANOCOBALAMIN 1000 MCG/ML IJ SOLN
1000.0000 ug | Freq: Once | INTRAMUSCULAR | Status: AC
  Administered 2018-07-16: 1000 ug via INTRAMUSCULAR
  Filled 2018-07-16: qty 1

## 2018-07-16 MED ORDER — PROPOFOL 500 MG/50ML IV EMUL
INTRAVENOUS | Status: DC | PRN
  Administered 2018-07-16: 100 ug/kg/min via INTRAVENOUS

## 2018-07-16 MED ORDER — PROPOFOL 10 MG/ML IV BOLUS
INTRAVENOUS | Status: DC | PRN
  Administered 2018-07-16: 20 mg via INTRAVENOUS

## 2018-07-16 MED ORDER — LACTATED RINGERS IV SOLN
INTRAVENOUS | Status: DC | PRN
  Administered 2018-07-16: 11:00:00 via INTRAVENOUS

## 2018-07-16 MED ORDER — PROPOFOL 10 MG/ML IV BOLUS
INTRAVENOUS | Status: AC
  Filled 2018-07-16: qty 40

## 2018-07-16 SURGICAL SUPPLY — 22 items

## 2018-07-16 NOTE — Discharge Summary (Signed)
Physician Discharge Summary  [name]  ZWC:[id number]  DOB: 1966-02-11  DOA: 07/14/2018 PCP: [name], NP  Admit date: 07/14/2018 Discharge date: 07/16/2018  Admitted From: Home Disposition: Home  Recommendations for Outpatient Follow-up:  1. Follow up with PCP in 1-2 weeks 2. Please obtain BMP/CBC in one week to monitor hemoglobin and and glucose 3. Please follow up on the following pending results: Biopsy results from the transverse colon polyp  Discharge Condition: Stable CODE STATUS: Full code Diet recommendation: Heart Healthy   Brief/Interim Summary: For full details see H&P/Progress note, but in brief, [name] is a 52 year old female with medical history significant for diabetes mellitus type 2, CHF, A. fib, hypertension, morbid obesity and bipolar disorder.  Patient presented to the emergency complaining of bright red blood per rectum started 2 days prior to admission.  Upon ED evaluation rectal exam with no frank blood, hemoglobin down from 11.9 to 10.6.  GI was consulted and patient was admitted with working diagnosis of possible lower GI bleed.  Subjective: Patient seen and examined, status post EGD, doing well has no abdominal pains.  No further bloody bowel movement.  No acute events overnight.  Hemoglobin stable.  Remains afebrile.  Discharge Diagnoses/Hospital Course:  Hematochezia/bleeding polyp In setting of anticoagulation, colonoscopy revealed 2 polyps on the transverse colon with stigmatic bleeding.  These were removed and sent to biopsy.  No other source of bleeding were found.  Colonoscopy also revealed internal hemorrhoids nonbleeding.  Hemoglobin trending upward.  Per GI okay to resume Xarelto in a.m. Coumadin dose.  Advised to follow-up with PCP to monitor CBC in 1 week.  Biopsy of the polyps pending.  Resume regular diet  Acute blood loss anemia From lower GI bleed.  Hemoglobin stable.  Check CBC in 1 week.  Persistent A. fib Rate well  controlled, continue Cardizem, okay to resume Xarelto in a.m. at prior dose.   Hypertension Blood pressure stable during hospital stay.  Continue home medications with no changes.  B12 deficiency Patient was given 1 dose of IM B12, recommend weekly injections for at least 6 to 8 weeks.  Follow-up with PCP.  Diabetes type 2 with uncontrolled hyperglycemia Per patient in the past had hypoglycemic coma, so she has been instructed to not take long-acting insulin and to allow permissive hyperglycemia.  CBGs were acceptable while on hospital, hypoglycemic medications were on hold, okay to resume at home dose with no changes.\  Chronic diastolic CHF Euvolemic during hospital stay.  Morbid obesity  Lifestyle modification, follow-up with PCP  All other chronic medical condition were stable during the hospitalization.  On the day of the discharge the patient's vitals were stable, and no other acute medical condition were reported by patient. the patient was felt safe to be discharge to home.   Discharge Instructions  You were cared for by a hospitalist during your hospital stay. If you have any questions about your discharge medications or the care you received while you were in the hospital after you are discharged, you can call the unit and asked to speak with the hospitalist on call if the hospitalist that took care of you is not available. Once you are discharged, your primary care physician will handle any further medical issues. Please note that NO REFILLS for any discharge medications will be authorized once you are discharged, as it is imperative that you return to your primary care physician (or establish a relationship with a primary care physician if you do not have one)  for your aftercare needs so that they can reassess your need for medications and monitor your lab values.  Discharge Instructions    Call MD for:  difficulty breathing, headache or visual disturbances   Complete by:   As directed    Call MD for:  extreme fatigue   Complete by:  As directed    Call MD for:  hives   Complete by:  As directed    Call MD for:  persistant dizziness or light-headedness   Complete by:  As directed    Call MD for:  persistant nausea and vomiting   Complete by:  As directed    Call MD for:  redness, tenderness, or signs of infection (pain, swelling, redness, odor or green/yellow discharge around incision site)   Complete by:  As directed    Call MD for:  severe uncontrolled pain   Complete by:  As directed    Call MD for:  temperature >100.4   Complete by:  As directed    Diet - low sodium heart healthy   Complete by:  As directed    Increase activity slowly   Complete by:  As directed      Allergies as of 07/16/2018      Reactions   Acetaminophen Other (See Comments)   Seizure-like "fits" as a child   Caffeine    Tense, anxiety, increased urination   Farxiga [dapagliflozin] Other (See Comments)   Hallucinations, drop in blood sugar   Lisinopril Rash   Rash with lisinopril; but fosinopril is ok per patient      Medication List    STOP taking these medications   sitaGLIPtin-metformin 50-1000 MG tablet Commonly known as:  JANUMET     TAKE these medications   albuterol 108 (90 Base) MCG/ACT inhaler Commonly known as:  PROVENTIL HFA;VENTOLIN HFA INHALE 1 TO 2 PUFFS EVERY 6 HOURS AS NEEDED FOR WHEEZING/ SHORTNESS OF BREATH   carvedilol 25 MG tablet Commonly known as:  COREG TAKE 1 TABLET BY MOUTH 2 TIMES DAILY.   diltiazem 240 MG 24 hr capsule Commonly known as:  CARDIZEM CD Take 1 capsule (240 mg total) by mouth daily.   ferrous sulfate 325 (65 FE) MG tablet Take 1 tablet (325 mg total) by mouth 2 (two) times daily.   furosemide 40 MG tablet Commonly known as:  LASIX Take 1 tablet (40 mg total) by mouth 2 (two) times daily.   glipiZIDE 10 MG 24 hr tablet Commonly known as:  GLUCOTROL XL Take 1 tablet (10 mg total) by mouth daily with breakfast.    glucose blood test strip Use as instructed. Check blood glucose levels by fingerstick twice per day   losartan 100 MG tablet Commonly known as:  COZAAR Take 1 tablet (100 mg total) by mouth daily.   metFORMIN 1000 MG tablet Commonly known as:  GLUCOPHAGE Take 1 tablet by mouth 2 (two) times daily with a meal.   oxybutynin 15 MG 24 hr tablet Commonly known as:  DITROPAN XL Take 1 tablet (15 mg total) by mouth at bedtime.   polyethylene glycol packet Commonly known as:  MIRALAX / GLYCOLAX Take 17 g by mouth daily as needed for mild constipation.   potassium chloride SA 20 MEQ tablet Commonly known as:  K-DUR,KLOR-CON Take 1 tablet (20 mEq total) by mouth 2 (two) times daily.   risperiDONE 2 MG tablet Commonly known as:  RISPERDAL Take 1 tablet (2 mg total) by mouth daily.   rivaroxaban 20 MG  Tabs tablet Commonly known as:  XARELTO Take 1 tablet (20 mg total) by mouth daily with supper. Start taking on:  07/17/2018      Follow-up Information    [name], NP. Schedule an appointment as soon as possible for a visit in 1 week(s).   Specialty:  Nurse Practitioner Why:  Hospital follow-up. needs IM B12 weekly  Contact information: [address] [place] [address] [phone]          Allergies  Allergen Reactions  . Acetaminophen Other (See Comments)    Seizure-like "fits" as a child  . Caffeine     Tense, anxiety, increased urination  . [place] [Dapagliflozin] Other (See Comments)    Hallucinations, drop in blood sugar  . Lisinopril Rash    Rash with lisinopril; but fosinopril is ok per patient    Consultations:  GI    Procedures/Studies: Colonoscopy 07/16/18 Per GI " Findings: An erythematous 7 mm polyp was noted in the rectum and appeared to have recently bleed,it was removed with hot snare polypectomy. A 12 mm pedunculated polyp was noted in the transverse colon and was removed via snare polypectomy. Normal colon otherwise, small  hemorrhoids noted on retroflexion. "  Discharge Exam: Vitals:   07/16/18 1145 07/16/18 1204  BP: 129/77 124/82  Pulse:  (!) 109  Resp:  20  Temp:  97.7 F (36.5 C)  SpO2:  96%   Vitals:   07/16/18 0952 07/16/18 1130 07/16/18 1145 07/16/18 1204  BP: (!) 175/86 131/82 129/77 124/82  Pulse:    (!) 109  Resp:  (!) 34  20  Temp:  98.2 F (36.8 C)  97.7 F (36.5 C)  TempSrc:  Oral  Oral  SpO2:  98%  96%  Weight:      Height:       General: NAD  Cardiovascular: RRR, S1/S2 +, no rubs, no gallops Respiratory: CTA bilaterally, no wheezing, no rhonchi Abdominal: Obese, Soft, NT, ND, bowel sounds + Extremities: no edema  The results of significant diagnostics from this hospitalization (including imaging, microbiology, ancillary and laboratory) are listed below for reference.     Microbiology: No results found for this or any previous visit (from the past 240 hour(s)).   Labs: BNP (last 3 results) Recent Labs    08/03/17 0553 12/09/17 1540 12/23/17 2005  BNP 250.5* 552.6* 595.6*   Basic Metabolic Panel: Recent Labs  Lab 07/14/18 1545 07/16/18 0602  NA 141 137  K 3.8 4.0  CL 104 103  CO2 23 25  GLUCOSE 257* 207*  BUN 11 8  CREATININE 0.87 0.63  CALCIUM 9.1 8.5*   Liver Function Tests: Recent Labs  Lab 07/14/18 1545  AST 26  ALT 14  ALKPHOS 98  BILITOT 0.7  PROT 7.5  ALBUMIN 3.6   No results for input(s): LIPASE, AMYLASE in the last 168 hours. No results for input(s): AMMONIA in the last 168 hours. CBC: Recent Labs  Lab 07/14/18 1545 07/15/18 0034 07/15/18 0811 07/15/18 1410 07/16/18 0602  WBC 6.6  --   --   --  6.5  NEUTROABS  --   --   --   --  4.0  HGB 10.6* 9.5* 9.5* 9.3* 9.7*  HCT 34.9* 31.3* 32.0* 30.4* 32.4*  MCV 88.1  --   --   --  90.0  PLT 280  --   --   --  231   Cardiac Enzymes: No results for input(s): CKTOTAL, CKMB, CKMBINDEX, TROPONINI in the  last 168 hours. BNP: Invalid input(s): POCBNP CBG: Recent Labs  Lab 07/15/18 1149  07/15/18 1643 07/15/18 2039 07/16/18 0752 07/16/18 1158  GLUCAP 214* 185* 186* 202* 183*   D-Dimer No results for input(s): DDIMER in the last 72 hours. Hgb A1c No results for input(s): HGBA1C in the last 72 hours. Lipid Profile No results for input(s): CHOL, HDL, LDLCALC, TRIG, CHOLHDL, LDLDIRECT in the last 72 hours. Thyroid function studies No results for input(s): TSH, T4TOTAL, T3FREE, THYROIDAB in the last 72 hours.  Invalid input(s): FREET3 Anemia work up Recent Labs    07/15/18 0034  VITAMINB12 141*  FOLATE 8.6  FERRITIN 49  TIBC 378  IRON 112   Urinalysis    Component Value Date/Time   COLORURINE YELLOW 04/13/2018 1214   APPEARANCEUR HAZY (A) 04/13/2018 1214   LABSPEC 1.008 04/13/2018 1214   PHURINE 5.0 04/13/2018 1214   GLUCOSEU NEGATIVE 04/13/2018 1214   HGBUR SMALL (A) 04/13/2018 1214   BILIRUBINUR NEGATIVE 04/13/2018 1214   BILIRUBINUR neg 09/10/2014 1219   KETONESUR NEGATIVE 04/13/2018 1214   PROTEINUR 30 (A) 04/13/2018 1214   UROBILINOGEN 1.0 09/10/2014 1219   UROBILINOGEN 0.2 12/07/2013 0041   NITRITE NEGATIVE 04/13/2018 1214   LEUKOCYTESUR SMALL (A) 04/13/2018 1214   Sepsis Labs Invalid input(s): PROCALCITONIN,  WBC,  LACTICIDVEN Microbiology No results found for this or any previous visit (from the past 240 hour(s)).  Time coordinating discharge: 32 minutes  SIGNED:  Chipper Oman, MD  [facility] 07/16/2018, 12:40 PM  Pager please text page via  [email or web address]  Note - This record has been created using [employer]. Chart creation errors have been sought, but may not always have been located. Such creation errors do not reflect on the standard of medical care.

## 2018-07-16 NOTE — Op Note (Signed)
Colonoscopy was performed for hematochezia.  Findings: An erythematous 7 mm polyp was noted in the rectum and appeared to have recently bleed,it was removed with hot snare polypectomy. A 12 mm pedunculated polyp was noted in the transverse colon and was removed via snare polypectomy. Normal colon otherwise, small hemorrhoids noted on retroflexion.   Recommendations: Ok to resume xarelto in am Regular diet OK to d/c from GI standpoint.  Ronnette Juniper, MD

## 2018-07-16 NOTE — Progress Notes (Signed)
Pt leaving this afternoon with her mother. Post colonoscopy, pt has eaten lunch without difficulty. No pt complaints. Discharge instructions/prescriptions given/explained with pt verbalizing understanding. Pt aware of followup.

## 2018-07-16 NOTE — Brief Op Note (Signed)
07/14/2018 - 07/16/2018  11:31 AM  PATIENT:  [name]  52 y.o. female  PRE-OPERATIVE DIAGNOSIS:  hematochezia, anemia  POST-OPERATIVE DIAGNOSIS:  * No post-op diagnosis entered *  PROCEDURE:  Procedure(s): COLONOSCOPY WITH PROPOFOL (Left) POLYPECTOMY  SURGEON:  Surgeon(s) and Role:    Ronnette Juniper, MD - Primary  PHYSICIAN ASSISTANT:   ASSISTANTS:Jennifer Kappus, RN, Minette Brine, Tech  ANESTHESIA:   MAC  EBL:  minimal  BLOOD ADMINISTERED:none  DRAINS: none   LOCAL MEDICATIONS USED:  NONE  SPECIMEN:  Biopsy / Limited Resection  DISPOSITION OF SPECIMEN:  PATHOLOGY  COUNTS:  YES  TOURNIQUET:  * No tourniquets in log *  DICTATION: .Dragon Dictation  PLAN OF CARE: Admit to inpatient   PATIENT DISPOSITION:  PACU - hemodynamically stable.   Delay start of Pharmacological VTE agent (>24hrs) due to surgical blood loss or risk of bleeding: yes

## 2018-07-16 NOTE — Progress Notes (Signed)
Pt. Has finished the prep for colonoscopy and bowel movement looks  clear liquid.

## 2018-07-16 NOTE — Op Note (Signed)
[facility] Patient Name: [name] Procedure Date: 07/16/2018 MRN: [id number] Attending MD: Ronnette Juniper , MD Date of Birth: 24-May-1966 CSN: [id number] Age: 52 Admit Type: Inpatient Procedure:                Colonoscopy Indications:              This is the patient's first colonoscopy,                            Hematochezia Providers:                Ronnette Juniper, MD, Carlyn Reichert, RN, William Dalton,                            Technician Referring MD:              Medicines:                Monitored Anesthesia Care Complications:            No immediate complications. Estimated blood loss:                            Minimal. Estimated Blood Loss:     Estimated blood loss was minimal. Procedure:                Pre-Anesthesia Assessment:                           - Prior to the procedure, a History and Physical                            was performed, and patient medications and                            allergies were reviewed. The patient's tolerance of                            previous anesthesia was also reviewed. The risks                            and benefits of the procedure and the sedation                            options and risks were discussed with the patient.                            All questions were answered, and informed consent                            was obtained. Prior Anticoagulants: The patient has                            taken Xarelto (rivaroxaban), last dose was 2 days                            prior to procedure. ASA Grade Assessment: III -  A                            patient with severe systemic disease. After                            reviewing the risks and benefits, the patient was                            deemed in satisfactory condition to undergo the                            procedure.                           After obtaining informed consent, the colonoscope                            was passed under direct  vision. Throughout the                            procedure, the patient's blood pressure, pulse, and                            oxygen saturations were monitored continuously. The                            PCF-H190DL ([id number] Olympus peds colonoscope was                            introduced through the anus and advanced to the the                            cecum, identified by appendiceal orifice and                            ileocecal valve. The colonoscopy was performed                            without difficulty. The patient tolerated the                            procedure well. The quality of the bowel                            preparation was good. Scope In: 11:04:25 AM Scope Out: 11:25:49 AM Scope Withdrawal Time: 0 hours 15 minutes 3 seconds  Total Procedure Duration: 0 hours 21 minutes 24 seconds  Findings:      The perianal and digital rectal examinations were normal.      A 7 mm erythematous polyp was found in the rectum. It appeared to the       site of recent bleeding. The polyp was sessile. The polyp was removed       with a hot snare. Resection and retrieval were complete.      A 12 mm polyp  was found in the transverse colon. The polyp was       pedunculated. The polyp was removed with a hot snare. Resection and       retrieval were complete after the polyp was cut in half post polypectomy       to be suctioned through the scope.      Non-bleeding internal hemorrhoids were found during retroflexion. The       hemorrhoids were small.      The exam was otherwise without abnormality. Impression:               - One 7 mm polyp in the rectum, removed with a hot                            snare. Resected and retrieved.                           - One 12 mm polyp in the transverse colon, removed                            with a hot snare. Resected and retrieved.                           - Non-bleeding internal hemorrhoids.                           - The examination  was otherwise normal. Moderate Sedation:      Patient did not receive moderate sedation for this procedure, but       instead received monitored anesthesia care. Recommendation:           - Resume regular diet.                           - Continue present medications.                           - Resume Xarelto (rivaroxaban) at prior dose                            tomorrow.                           - Await pathology results.                           - Repeat colonoscopy for surveillance based on                            pathology results. Procedure Code(s):        --- Professional ---                           [phone], Colonoscopy, flexible; with removal of                            tumor(s), polyp(s), or other lesion(s) by snare  technique Diagnosis Code(s):        --- Professional ---                           K62.1, Rectal polyp                           D12.3, Benign neoplasm of transverse colon (hepatic                            flexure or splenic flexure)                           K64.8, Other hemorrhoids                           K92.1, Melena (includes Hematochezia) CPT copyright 2018 American Medical Association. All rights reserved. The codes documented in this report are preliminary and upon coder review may  be revised to meet current compliance requirements. Ronnette Juniper, MD 07/16/2018 11:30:46 AM This report has been signed electronically. Number of Addenda: 0

## 2018-07-16 NOTE — Transfer of Care (Signed)
Immediate Anesthesia Transfer of Care Note  Patient: [name]  Procedure(s) Performed: COLONOSCOPY WITH PROPOFOL (Left ) POLYPECTOMY  Patient Location: PACU  Anesthesia Type:MAC  Level of Consciousness: sedated  Airway & Oxygen Therapy: Patient Spontanous Breathing and Patient connected to nasal cannula oxygen  Post-op Assessment: Report given to RN and Post -op Vital signs reviewed and stable  Post vital signs: Reviewed and stable  Last Vitals:  Vitals Value Taken Time  BP    Temp    Pulse    Resp    SpO2      Last Pain:  Vitals:   07/16/18 0944  TempSrc: Oral  PainSc: 0-No pain      Patients Stated Pain Goal: 0 (19/62/22 9798)  Complications: No apparent anesthesia complications

## 2018-07-16 NOTE — Anesthesia Preprocedure Evaluation (Signed)
Anesthesia Evaluation  Patient identified by MRN, date of birth, ID band Patient awake    Reviewed: Allergy & Precautions, NPO status , Patient's Chart, lab work & pertinent test results  Airway Mallampati: III  TM Distance: >3 FB Neck ROM: Full    Dental  (+) Teeth Intact, Dental Advisory Given   Pulmonary    breath sounds clear to auscultation       Cardiovascular hypertension,  Rhythm:Irregular Rate:Normal     Neuro/Psych    GI/Hepatic   Endo/Other  diabetes  Renal/GU      Musculoskeletal   Abdominal (+) + obese,   Peds  Hematology   Anesthesia Other Findings   Reproductive/Obstetrics                             Anesthesia Physical Anesthesia Plan  ASA: III  Anesthesia Plan: MAC   Post-op Pain Management:    Induction: Intravenous  PONV Risk Score and Plan: Ondansetron  Airway Management Planned: Natural Airway and Simple Face Mask  Additional Equipment:   Intra-op Plan:   Post-operative Plan:   Informed Consent: I have reviewed the patients History and Physical, chart, labs and discussed the procedure including the risks, benefits and alternatives for the proposed anesthesia with the patient or authorized representative who has indicated his/her understanding and acceptance.     Plan Discussed with: CRNA and Anesthesiologist  Anesthesia Plan Comments:         Anesthesia Quick Evaluation

## 2018-07-16 NOTE — Interval H&P Note (Signed)
History and Physical Interval Note: 52/female with hematochezia, was on xarelto for A fib, has morbid obestiy, OSA, CHF for a colonoscopy today.  07/16/2018 9:51 AM  [name]  has presented today for colonoscopy, with the diagnosis of hematochezia, anemia  The various methods of treatment have been discussed with the patient and family. After consideration of risks, benefits and other options for treatment, the patient has consented to  Procedure(s): COLONOSCOPY WITH PROPOFOL (Left) as a surgical intervention .  The patient's history has been reviewed, patient examined, no change in status, stable for surgery.  I have reviewed the patient's chart and labs.  Questions were answered to the patient's satisfaction.     Ronnette Juniper

## 2018-07-17 NOTE — Anesthesia Postprocedure Evaluation (Signed)
Anesthesia Post Note  Patient: [name]  Procedure(s) Performed: COLONOSCOPY WITH PROPOFOL (Left ) POLYPECTOMY     Patient location during evaluation: Endoscopy Anesthesia Type: MAC Level of consciousness: awake and alert Pain management: pain level controlled Vital Signs Assessment: post-procedure vital signs reviewed and stable Respiratory status: spontaneous breathing, nonlabored ventilation, respiratory function stable and patient connected to nasal cannula oxygen Cardiovascular status: stable and blood pressure returned to baseline Postop Assessment: no apparent nausea or vomiting Anesthetic complications: no    Last Vitals:  Vitals:   07/16/18 1145 07/16/18 1204  BP: 129/77 124/82  Pulse:  (!) 109  Resp:  20  Temp:  36.5 C  SpO2:  96%    Last Pain:  Vitals:   07/16/18 1204  TempSrc: Oral  PainSc:                  Shalea Tomczak COKER

## 2018-07-18 ENCOUNTER — Encounter (HOSPITAL_COMMUNITY): Payer: Self-pay | Admitting: Gastroenterology

## 2018-07-18 MED FILL — PROVENTIL HFA 108 (90 BASE): 108 (90 BAS | 30 days supply | Qty: 7 | Fill #1

## 2018-07-18 MED FILL — FERROUS SULFATE 325 MG TAB: 325 (65 FE) | 30 days supply | Qty: 60 | Fill #3

## 2018-07-18 MED FILL — glipiZIDE XL 10 MG TB24: 10 | 30 days supply | Qty: 30 | Fill #0

## 2018-07-18 MED FILL — FUROSEMIDE 40 MG TAB: 40 | 30 days supply | Qty: 60 | Fill #3

## 2018-07-18 MED FILL — XARELTO 20 MG TABLET: 20 | 30 days supply | Qty: 30 | Fill #4

## 2018-07-18 MED FILL — LOSARTAN POTASSIUM 100 MG T: 100 | 30 days supply | Qty: 30 | Fill #0

## 2018-07-18 MED FILL — metFORMIN HCL 1000 MG TABS: 1000 | 30 days supply | Qty: 60 | Fill #0

## 2018-07-19 ENCOUNTER — Inpatient Hospital Stay: Payer: Medicaid Other | Admitting: Family Medicine

## 2018-07-20 ENCOUNTER — Encounter: Payer: Self-pay | Admitting: Family Medicine

## 2018-07-20 ENCOUNTER — Ambulatory Visit: Payer: Medicaid Other | Attending: Family Medicine | Admitting: Family Medicine

## 2018-07-20 VITALS — BP 151/104 | HR 100 | Temp 98.0°F | Ht 67.0 in | Wt 333.4 lb

## 2018-07-20 DIAGNOSIS — E1165 Type 2 diabetes mellitus with hyperglycemia: Secondary | ICD-10-CM | POA: Insufficient documentation

## 2018-07-20 DIAGNOSIS — E538 Deficiency of other specified B group vitamins: Secondary | ICD-10-CM | POA: Insufficient documentation

## 2018-07-20 DIAGNOSIS — I4819 Other persistent atrial fibrillation: Secondary | ICD-10-CM | POA: Insufficient documentation

## 2018-07-20 DIAGNOSIS — R5383 Other fatigue: Secondary | ICD-10-CM

## 2018-07-20 DIAGNOSIS — Z79899 Other long term (current) drug therapy: Secondary | ICD-10-CM | POA: Insufficient documentation

## 2018-07-20 DIAGNOSIS — I10 Essential (primary) hypertension: Secondary | ICD-10-CM

## 2018-07-20 DIAGNOSIS — Z888 Allergy status to other drugs, medicaments and biological substances status: Secondary | ICD-10-CM | POA: Diagnosis not present

## 2018-07-20 DIAGNOSIS — I110 Hypertensive heart disease with heart failure: Secondary | ICD-10-CM | POA: Diagnosis not present

## 2018-07-20 DIAGNOSIS — G4733 Obstructive sleep apnea (adult) (pediatric): Secondary | ICD-10-CM | POA: Insufficient documentation

## 2018-07-20 DIAGNOSIS — Z9889 Other specified postprocedural states: Secondary | ICD-10-CM | POA: Diagnosis not present

## 2018-07-20 DIAGNOSIS — K648 Other hemorrhoids: Secondary | ICD-10-CM | POA: Diagnosis not present

## 2018-07-20 DIAGNOSIS — F319 Bipolar disorder, unspecified: Secondary | ICD-10-CM | POA: Diagnosis not present

## 2018-07-20 DIAGNOSIS — Z6841 Body Mass Index (BMI) 40.0 and over, adult: Secondary | ICD-10-CM | POA: Diagnosis not present

## 2018-07-20 DIAGNOSIS — I272 Pulmonary hypertension, unspecified: Secondary | ICD-10-CM | POA: Diagnosis not present

## 2018-07-20 LAB — GLUCOSE, POCT (MANUAL RESULT ENTRY): POC Glucose: 192 mg/dl — AB (ref 70–99)

## 2018-07-20 MED ORDER — CYANOCOBALAMIN 1000 MCG/ML IJ SOLN
1000.0000 ug | Freq: Once | INTRAMUSCULAR | Status: AC
  Administered 2018-07-20: 1000 ug via INTRAMUSCULAR

## 2018-07-20 MED FILL — POTASSIUM CL ER 20 MEQ TAB: 20 | 30 days supply | Qty: 60 | Fill #3

## 2018-07-20 MED FILL — OXYBUTYNIN CL ER 15 MG TAB: 15 | 30 days supply | Qty: 30 | Fill #3

## 2018-07-20 MED FILL — CARVEDILOL 25 MG TABLET: 25 | 30 days supply | Qty: 60 | Fill #3

## 2018-07-20 MED FILL — risperiDONE 2 MG TABS: 2 | 30 days supply | Qty: 30 | Fill #1

## 2018-07-20 NOTE — Patient Instructions (Signed)

## 2018-07-21 ENCOUNTER — Other Ambulatory Visit: Payer: Self-pay | Admitting: Family Medicine

## 2018-07-21 LAB — CBC WITH DIFFERENTIAL/PLATELET
BASOS ABS: 0 10*3/uL (ref 0.0–0.2)
Basos: 1 %
EOS (ABSOLUTE): 0 10*3/uL (ref 0.0–0.4)
Eos: 1 %
HEMATOCRIT: 31.6 % — AB (ref 34.0–46.6)
Hemoglobin: 10.2 g/dL — ABNORMAL LOW (ref 11.1–15.9)
IMMATURE GRANULOCYTES: 1 %
Immature Grans (Abs): 0 10*3/uL (ref 0.0–0.1)
LYMPHS ABS: 1.1 10*3/uL (ref 0.7–3.1)
Lymphs: 19 %
MCH: 27.6 pg (ref 26.6–33.0)
MCHC: 32.3 g/dL (ref 31.5–35.7)
MCV: 85 fL (ref 79–97)
MONOS ABS: 0.6 10*3/uL (ref 0.1–0.9)
Monocytes: 10 %
NEUTROS PCT: 68 %
Neutrophils Absolute: 4 10*3/uL (ref 1.4–7.0)
Platelets: 265 10*3/uL (ref 150–450)
RBC: 3.7 x10E6/uL — AB (ref 3.77–5.28)
RDW: 15.8 % — AB (ref 12.3–15.4)
WBC: 5.8 10*3/uL (ref 3.4–10.8)

## 2018-07-21 LAB — VITAMIN D 25 HYDROXY (VIT D DEFICIENCY, FRACTURES): Vit D, 25-Hydroxy: 6.9 ng/mL — ABNORMAL LOW (ref 30.0–100.0)

## 2018-07-21 MED ORDER — ERGOCALCIFEROL 1.25 MG (50000 UT) PO CAPS: 50000.0000 [IU] | ORAL_CAPSULE | ORAL | 1 refills | Status: DC

## 2018-07-22 ENCOUNTER — Encounter: Payer: Self-pay | Admitting: Family Medicine

## 2018-07-22 NOTE — Progress Notes (Signed)
Subjective:  Patient ID: [name], female    DOB: 18-Nov-1965  Age: 52 y.o. MRN: [id number]  CC: Hospitalization Follow-up and Diabetes   HPI [name] is a 52 year old female  With a history of Type 2 DM (A1c 9.2), Hypertension, A.fib, Obesity, Bipolar disorder hospitalized at [facility] for Lower GI bleed from 07/14/18 -07/16/18. Hemoglobin was 10.6 on presentation; Xarelto was held. She underwent EGD and colonoscopy with removal of 2 polyps and findings of internal hemorrhoids. Labs also revealed vitamin B12 deficiency with a level of 141. Xarelto was resumed and she was discharged.  She presents today denying any repeat hematochezia and is also yet to restart Xarelto. Pathology of polyps revealed: Diagnosis 1. Colon, polyp(s), transverse - TUBULAR ADENOMA. - NO HIGH GRADE DYSPLASIA IDENTIFIED. 2. Rectum, polyp(s) - HYPERPLASTIC POLYP.  She complains of fatigue ever since discharge. Her blood pressure is elevated and she is yet to take her Cardizem. Denies chest pains, dyspnea, pedal edema.  Past Medical History:  Diagnosis Date  . Acute on chronic diastolic congestive heart failure ([place]) 11/02/2013   10/03/2015, 11/13/2015, 08/03/2017  . Benign essential HTN 11/28/2013  . Bipolar disease, chronic ([place])   . Chest pain    a. 2012 Myoview: EF 63%, no isch/infarct;  b. 04/2016 Lexiscan MV: EF 73%, no ischemia/infarct-->Low risk.  . Chronic diastolic CHF (congestive heart failure) ([place]) 07/23/2011   a. 2015 Echo: EF 55-60%, Gr2 DD;  b. 09/2015 Echo: EF 60-65%, no rwma, mod dil LA, PASP 16mmHg.  . Cor pulmonale (chronic) ([place])   . History of thyrotoxicosis   . HTN (hypertension) 11/28/2013  . Hypertensive heart disease 10/18/2013  . Hypoglycemia   . Insulin dependent type 2 diabetes mellitus, uncontrolled ([place])   . Mediastinal adenopathy   . Morbid obesity due to excess calories ([place]) 02/19/2011  . Morbid obesity with BMI of 50.0-59.9, adult ([place])   . OSA  (obstructive sleep apnea) 03/06/2011  . Persistent atrial fibrillation 12/09/2017  . Pulmonary HTN, moderate to severe 11/03/2013  . Sinusitis, chronic 01/02/2015  . SVT (supraventricular tachycardia) ([place]) 12/06/2013  . Uncontrolled type 2 diabetes mellitus with hyperglycemia [facility])     Past Surgical History:  Procedure Laterality Date  . CARDIOVERSION N/A 04/05/2018   Procedure: CARDIOVERSION;  Surgeon: Lelon Perla, MD;  Location: [facility] ENDOSCOPY;  Service: Cardiovascular;  Laterality: N/A;  . COLONOSCOPY WITH PROPOFOL Left 07/16/2018   Procedure: COLONOSCOPY WITH PROPOFOL;  Surgeon: Ronnette Juniper, MD;  Location: [facility] ENDOSCOPY;  Service: Gastroenterology;  Laterality: Left;  . None    . POLYPECTOMY  07/16/2018   Procedure: POLYPECTOMY;  Surgeon: Ronnette Juniper, MD;  Location: [facility] ENDOSCOPY;  Service: Gastroenterology;;    Allergies  Allergen Reactions  . Acetaminophen Other (See Comments)    Seizure-like "fits" as a child  . Caffeine     Tense, anxiety, increased urination  . [place] [Dapagliflozin] Other (See Comments)    Hallucinations, drop in blood sugar  . Lisinopril Rash    Rash with lisinopril; but fosinopril is ok per patient     Outpatient Medications Prior to Visit  Medication Sig Dispense Refill  . carvedilol (COREG) 25 MG tablet TAKE 1 TABLET BY MOUTH 2 TIMES DAILY. 180 tablet 3  . ferrous sulfate 325 (65 FE) MG tablet Take 1 tablet (325 mg total) by mouth 2 (two) times daily. 60 tablet 3  . furosemide (LASIX) 40 MG tablet Take 1 tablet (40 mg total) by mouth 2 (two) times daily. [place]  tablet 6  . glipiZIDE (GLIPIZIDE XL) 10 MG 24 hr tablet Take 1 tablet (10 mg total) by mouth daily with breakfast. 90 tablet 2  . glucose blood test strip Use as instructed. Check blood glucose levels by fingerstick twice per day 100 each 12  . losartan (COZAAR) 100 MG tablet Take 1 tablet (100 mg total) by mouth daily. 90 tablet 2  . metFORMIN (GLUCOPHAGE) 1000 MG tablet Take 1 tablet by mouth 2  (two) times daily with a meal.  2  . oxybutynin (DITROPAN XL) 15 MG 24 hr tablet Take 1 tablet (15 mg total) by mouth at bedtime. 90 tablet 3  . polyethylene glycol (MIRALAX / GLYCOLAX) packet Take 17 g by mouth daily as needed for mild constipation. 14 each 0  . potassium chloride SA (K-DUR,KLOR-CON) 20 MEQ tablet Take 1 tablet (20 mEq total) by mouth 2 (two) times daily. 60 tablet 6  . albuterol (VENTOLIN HFA) 108 (90 Base) MCG/ACT inhaler INHALE 1 TO 2 PUFFS EVERY 6 HOURS AS NEEDED FOR WHEEZING/ SHORTNESS OF BREATH (Patient not taking: Reported on 07/20/2018) 18 g 1  . diltiazem (CARDIZEM CD) 240 MG 24 hr capsule Take 1 capsule (240 mg total) by mouth daily. (Patient not taking: Reported on 07/20/2018) 30 capsule 6  . risperiDONE (RISPERDAL) 2 MG tablet Take 1 tablet (2 mg total) by mouth daily. (Patient not taking: Reported on 07/20/2018) 30 tablet 1  . rivaroxaban (XARELTO) 20 MG TABS tablet Take 1 tablet (20 mg total) by mouth daily with supper. (Patient not taking: Reported on 07/20/2018) 30 tablet 0   No facility-administered medications prior to visit.     ROS Review of Systems  Constitutional: Positive for fatigue. Negative for activity change and appetite change.  HENT: Negative for congestion, sinus pressure and sore throat.   Eyes: Negative for visual disturbance.  Respiratory: Negative for cough, chest tightness, shortness of breath and wheezing.   Cardiovascular: Negative for chest pain and palpitations.  Gastrointestinal: Negative for abdominal distention, abdominal pain and constipation.  Endocrine: Negative for polydipsia.  Genitourinary: Negative for dysuria and frequency.  Musculoskeletal: Negative for arthralgias and back pain.  Skin: Negative for rash.  Neurological: Negative for tremors, light-headedness and numbness.  Hematological: Does not bruise/bleed easily.  Psychiatric/Behavioral: Negative for agitation and behavioral problems.    Objective:  BP (!) 151/104    Pulse 100   Temp 98 F (36.7 C) (Oral)   Ht 5\' 7"  (1.702 m)   Wt (!) 333 lb 6.4 oz (151.2 kg)   SpO2 91%   BMI 52.22 kg/m   BP/Weight 07/20/2018 07/16/2018 6/57/8469  Systolic BP 629 528 413  Diastolic BP 244 82 91  Wt. (Lbs) 333.4 353 344.8  BMI 52.22 55.29 54      Physical Exam  Constitutional: She is oriented to person, place, and time. She appears well-developed and well-nourished.  Neck: No JVD present.  Cardiovascular: Normal rate, normal heart sounds and intact distal pulses.  No murmur heard. Pulmonary/Chest: Effort normal and breath sounds normal. She has no wheezes. She has no rales. She exhibits no tenderness.  Abdominal: Soft. Bowel sounds are normal. She exhibits no distension and no mass. There is no tenderness.  Musculoskeletal: Normal range of motion.  Neurological: She is alert and oriented to person, place, and time.  Skin: Skin is warm and dry.  Psychiatric: She has a normal mood and affect.    Lab Results  Component Value Date   HGBA1C 9.2 (A) 06/21/2018  Assessment & Plan:   1. Uncontrolled type 2 diabetes mellitus with hyperglycemia (HCC) Uncontrolled with A1c of 9.2 Previous history of hypoglycemic coma Caution with titration of meds due to above Has upcoming follow up with PCP regarding this. - POCT glucose (manual entry)  2. Vitamin B12 deficiency Commence replacement with monthly injections - cyanocobalamin ((VITAMIN B-12)) injection 1,000 mcg  3. Other fatigue Could be secondary to deconditioning Will also need to exclude metabolic causes - CBC with Differential/Platelet - VITAMIN D 25 Hydroxy (Vit-D Deficiency, Fractures)  4. Persistent atrial fibrillation She is yet to resume Xarelto which I have encouraged her to do Continue Cardizem for rate control  5. Essential hypertension Uncontrolled Did not take Cardizem this morning   6. Internal hemorrhoid Asymptomatic at this time - CBC with Differential/Platelet   Meds  ordered this encounter  Medications  . cyanocobalamin ((VITAMIN B-12)) injection 1,000 mcg    Follow-up: Return for Follow-up of chronic medical conditions, keep previously scheduled appointment with PCP.Marland Kitchen   Charlott Rakes MD

## 2018-07-27 ENCOUNTER — Telehealth: Payer: Self-pay

## 2018-07-27 NOTE — Telephone Encounter (Signed)
Patient was called and informed of lab results. 

## 2018-07-27 NOTE — Telephone Encounter (Signed)
-----   Message from Charlott Rakes, MD sent at 07/21/2018  8:58 AM EDT ----- She is anemic but this has improved compared to previous labs.  Vitamin D is low and this could explain her fatigue.  I have sent a prescription for vitamin D replacement to her pharmacy.

## 2018-08-01 ENCOUNTER — Encounter: Payer: Self-pay | Admitting: Cardiology

## 2018-08-03 ENCOUNTER — Inpatient Hospital Stay (HOSPITAL_COMMUNITY)
Admission: EM | Admit: 2018-08-03 | Discharge: 2018-08-09 | DRG: 286 | Disposition: A | Discharge status: Home / Self Care | Payer: Medicaid Other | Attending: Family Medicine | Admitting: Family Medicine

## 2018-08-03 ENCOUNTER — Emergency Department (HOSPITAL_COMMUNITY): Payer: Medicaid Other

## 2018-08-03 ENCOUNTER — Encounter (HOSPITAL_COMMUNITY): Payer: Self-pay | Admitting: Emergency Medicine

## 2018-08-03 ENCOUNTER — Other Ambulatory Visit: Payer: Self-pay

## 2018-08-03 ENCOUNTER — Observation Stay (HOSPITAL_BASED_OUTPATIENT_CLINIC_OR_DEPARTMENT_OTHER): Payer: Medicaid Other

## 2018-08-03 DIAGNOSIS — I110 Hypertensive heart disease with heart failure: Secondary | ICD-10-CM | POA: Diagnosis present

## 2018-08-03 DIAGNOSIS — N170 Acute kidney failure with tubular necrosis: Secondary | ICD-10-CM | POA: Diagnosis present

## 2018-08-03 DIAGNOSIS — I10 Essential (primary) hypertension: Secondary | ICD-10-CM | POA: Diagnosis present

## 2018-08-03 DIAGNOSIS — I4819 Other persistent atrial fibrillation: Secondary | ICD-10-CM | POA: Diagnosis not present

## 2018-08-03 DIAGNOSIS — I214 Non-ST elevation (NSTEMI) myocardial infarction: Secondary | ICD-10-CM

## 2018-08-03 DIAGNOSIS — I5030 Unspecified diastolic (congestive) heart failure: Secondary | ICD-10-CM | POA: Diagnosis not present

## 2018-08-03 DIAGNOSIS — R739 Hyperglycemia, unspecified: Secondary | ICD-10-CM

## 2018-08-03 DIAGNOSIS — R079 Chest pain, unspecified: Secondary | ICD-10-CM | POA: Diagnosis not present

## 2018-08-03 DIAGNOSIS — I248 Other forms of acute ischemic heart disease: Secondary | ICD-10-CM | POA: Diagnosis present

## 2018-08-03 DIAGNOSIS — I5033 Acute on chronic diastolic (congestive) heart failure: Secondary | ICD-10-CM | POA: Diagnosis not present

## 2018-08-03 DIAGNOSIS — R0789 Other chest pain: Secondary | ICD-10-CM | POA: Diagnosis not present

## 2018-08-03 DIAGNOSIS — E1165 Type 2 diabetes mellitus with hyperglycemia: Secondary | ICD-10-CM | POA: Diagnosis not present

## 2018-08-03 DIAGNOSIS — Z7984 Long term (current) use of oral hypoglycemic drugs: Secondary | ICD-10-CM

## 2018-08-03 DIAGNOSIS — Z8249 Family history of ischemic heart disease and other diseases of the circulatory system: Secondary | ICD-10-CM

## 2018-08-03 DIAGNOSIS — I2729 Other secondary pulmonary hypertension: Secondary | ICD-10-CM | POA: Diagnosis present

## 2018-08-03 DIAGNOSIS — I272 Pulmonary hypertension, unspecified: Secondary | ICD-10-CM | POA: Diagnosis present

## 2018-08-03 DIAGNOSIS — N179 Acute kidney failure, unspecified: Secondary | ICD-10-CM

## 2018-08-03 DIAGNOSIS — I509 Heart failure, unspecified: Secondary | ICD-10-CM | POA: Insufficient documentation

## 2018-08-03 DIAGNOSIS — N141 Nephropathy induced by other drugs, medicaments and biological substances: Secondary | ICD-10-CM | POA: Diagnosis present

## 2018-08-03 DIAGNOSIS — I4891 Unspecified atrial fibrillation: Secondary | ICD-10-CM | POA: Diagnosis present

## 2018-08-03 DIAGNOSIS — I4901 Ventricular fibrillation: Secondary | ICD-10-CM | POA: Diagnosis not present

## 2018-08-03 DIAGNOSIS — I472 Ventricular tachycardia: Secondary | ICD-10-CM | POA: Diagnosis present

## 2018-08-03 DIAGNOSIS — R0602 Shortness of breath: Secondary | ICD-10-CM | POA: Diagnosis not present

## 2018-08-03 DIAGNOSIS — I5032 Chronic diastolic (congestive) heart failure: Secondary | ICD-10-CM | POA: Diagnosis present

## 2018-08-03 DIAGNOSIS — E66813 Obesity, class 3: Secondary | ICD-10-CM | POA: Diagnosis present

## 2018-08-03 DIAGNOSIS — F319 Bipolar disorder, unspecified: Secondary | ICD-10-CM | POA: Diagnosis present

## 2018-08-03 DIAGNOSIS — Z7901 Long term (current) use of anticoagulants: Secondary | ICD-10-CM

## 2018-08-03 DIAGNOSIS — Z79899 Other long term (current) drug therapy: Secondary | ICD-10-CM

## 2018-08-03 DIAGNOSIS — R002 Palpitations: Secondary | ICD-10-CM | POA: Diagnosis not present

## 2018-08-03 DIAGNOSIS — D62 Acute posthemorrhagic anemia: Secondary | ICD-10-CM | POA: Diagnosis present

## 2018-08-03 DIAGNOSIS — T508X5A Adverse effect of diagnostic agents, initial encounter: Secondary | ICD-10-CM | POA: Diagnosis present

## 2018-08-03 DIAGNOSIS — Z6841 Body Mass Index (BMI) 40.0 and over, adult: Secondary | ICD-10-CM

## 2018-08-03 LAB — I-STAT TROPONIN, ED: Troponin i, poc: 0.02 ng/mL (ref 0.00–0.08)

## 2018-08-03 LAB — DIFFERENTIAL
BASOS PCT: 0 %
Basophils Absolute: 0.1 10*3/uL (ref 0.0–0.1)
Eosinophils Absolute: 0 10*3/uL (ref 0.0–0.5)
Eosinophils Relative: 0 %
Lymphocytes Relative: 9 %
Lymphs Abs: 0.6 10*3/uL — ABNORMAL LOW (ref 0.7–4.0)
MONOS PCT: 4 %
Monocytes Absolute: 0.3 10*3/uL (ref 0.1–1.0)
NEUTROS ABS: 5.9 10*3/uL (ref 1.7–7.7)
Neutrophils Relative %: 85 %

## 2018-08-03 LAB — BASIC METABOLIC PANEL
ANION GAP: 12 (ref 5–15)
BUN: 14 mg/dL (ref 6–20)
CALCIUM: 9.4 mg/dL (ref 8.9–10.3)
CO2: 24 mmol/L (ref 22–32)
CREATININE: 0.77 mg/dL (ref 0.44–1.00)
Chloride: 101 mmol/L (ref 98–111)
Glucose, Bld: 416 mg/dL — ABNORMAL HIGH (ref 70–99)
Potassium: 3.9 mmol/L (ref 3.5–5.1)
SODIUM: 137 mmol/L (ref 135–145)

## 2018-08-03 LAB — BRAIN NATRIURETIC PEPTIDE: B Natriuretic Peptide: 493.9 pg/mL — ABNORMAL HIGH (ref 0.0–100.0)

## 2018-08-03 LAB — TROPONIN I
TROPONIN I: 1.33 ng/mL — AB (ref <0.03)
TROPONIN I: 3.26 ng/mL — AB (ref <0.03)
Troponin I: 2.14 ng/mL (ref <0.03)

## 2018-08-03 LAB — MAGNESIUM: MAGNESIUM: 1.6 mg/dL — AB (ref 1.7–2.4)

## 2018-08-03 LAB — CBC
HCT: 34.2 % — ABNORMAL LOW (ref 36.0–46.0)
Hemoglobin: 10.2 g/dL — ABNORMAL LOW (ref 12.0–15.0)
MCH: 27.1 pg (ref 26.0–34.0)
MCHC: 29.8 g/dL — ABNORMAL LOW (ref 30.0–36.0)
MCV: 91 fL (ref 80.0–100.0)
NRBC: 0 % (ref 0.0–0.2)
PLATELETS: 288 10*3/uL (ref 150–400)
RBC: 3.76 MIL/uL — AB (ref 3.87–5.11)
RDW: 15.9 % — AB (ref 11.5–15.5)
WBC: 7 10*3/uL (ref 4.0–10.5)

## 2018-08-03 LAB — GLUCOSE, CAPILLARY
GLUCOSE-CAPILLARY: 308 mg/dL — AB (ref 70–99)
GLUCOSE-CAPILLARY: 215 mg/dL — AB (ref 70–99)
Glucose-Capillary: 144 mg/dL — ABNORMAL HIGH (ref 70–99)

## 2018-08-03 LAB — PROTIME-INR
INR: 1.07
Prothrombin Time: 13.8 seconds (ref 11.4–15.2)

## 2018-08-03 LAB — ECHOCARDIOGRAM LIMITED

## 2018-08-03 LAB — APTT: aPTT: 31 seconds (ref 24–36)

## 2018-08-03 LAB — HEPARIN LEVEL (UNFRACTIONATED)
HEPARIN UNFRACTIONATED: 0.21 [IU]/mL — AB (ref 0.30–0.70)
Heparin Unfractionated: <0.1 IU/mL — ABNORMAL LOW (ref 0.30–0.70)

## 2018-08-03 LAB — I-STAT BETA HCG BLOOD, ED (MC, WL, AP ONLY): I-stat hCG, quantitative: <5 m[IU]/mL (ref <5)

## 2018-08-03 LAB — CBG MONITORING, ED: GLUCOSE-CAPILLARY: 225 mg/dL — AB (ref 70–99)

## 2018-08-03 LAB — MRSA PCR SCREENING: MRSA by PCR: NEGATIVE

## 2018-08-03 MED ORDER — ASPIRIN 81 MG PO CHEW
81.0000 mg | CHEWABLE_TABLET | ORAL | Status: AC
  Administered 2018-08-04: 81 mg via ORAL
  Filled 2018-08-03: qty 1

## 2018-08-03 MED ORDER — ASPIRIN EC 81 MG PO TBEC
81.0000 mg | DELAYED_RELEASE_TABLET | Freq: Every day | ORAL | Status: DC
  Administered 2018-08-03 – 2018-08-05 (×2): 81 mg via ORAL
  Filled 2018-08-03 (×2): qty 1

## 2018-08-03 MED ORDER — ONDANSETRON HCL 4 MG/2ML IJ SOLN
4.0000 mg | Freq: Once | INTRAMUSCULAR | Status: AC
  Administered 2018-08-03: 4 mg via INTRAVENOUS

## 2018-08-03 MED ORDER — SODIUM CHLORIDE 0.9% FLUSH: 3.0000 mL | INTRAVENOUS | Status: DC | PRN

## 2018-08-03 MED ORDER — SODIUM CHLORIDE 0.9 % IV SOLN: 250.0000 mL | INTRAVENOUS | Status: DC | PRN

## 2018-08-03 MED ORDER — LABETALOL HCL 5 MG/ML IV SOLN
10.0000 mg | INTRAVENOUS | Status: DC | PRN
  Administered 2018-08-03 – 2018-08-06 (×4): 10 mg via INTRAVENOUS
  Filled 2018-08-03 (×6): qty 4

## 2018-08-03 MED ORDER — SODIUM CHLORIDE 0.9 % IV SOLN
INTRAVENOUS | Status: DC
  Administered 2018-08-04: 06:00:00 via INTRAVENOUS

## 2018-08-03 MED ORDER — FUROSEMIDE 10 MG/ML IJ SOLN: 40.0000 mg | Freq: Once | INTRAMUSCULAR | Status: DC

## 2018-08-03 MED ORDER — MAGNESIUM SULFATE 2 GM/50ML IV SOLN
2.0000 g | Freq: Once | INTRAVENOUS | Status: AC
  Administered 2018-08-03: 2 g via INTRAVENOUS
  Filled 2018-08-03: qty 50

## 2018-08-03 MED ORDER — POLYETHYLENE GLYCOL 3350 17 G PO PACK: 17.0000 g | PACK | Freq: Every day | ORAL | Status: DC | PRN

## 2018-08-03 MED ORDER — ATORVASTATIN CALCIUM 40 MG PO TABS
40.0000 mg | ORAL_TABLET | Freq: Every day | ORAL | Status: DC
  Administered 2018-08-03 – 2018-08-08 (×6): 40 mg via ORAL
  Filled 2018-08-03 (×6): qty 1

## 2018-08-03 MED ORDER — HYDRALAZINE HCL 25 MG PO TABS
25.0000 mg | ORAL_TABLET | Freq: Two times a day (BID) | ORAL | Status: DC
  Administered 2018-08-03: 25 mg via ORAL
  Filled 2018-08-03: qty 1

## 2018-08-03 MED ORDER — NITROGLYCERIN 0.4 MG SL SUBL
0.4000 mg | SUBLINGUAL_TABLET | SUBLINGUAL | Status: DC | PRN
  Administered 2018-08-03: 0.4 mg via SUBLINGUAL

## 2018-08-03 MED ORDER — NITROGLYCERIN 0.4 MG SL SUBL
SUBLINGUAL_TABLET | SUBLINGUAL | Status: AC
  Filled 2018-08-03: qty 1

## 2018-08-03 MED ORDER — ONDANSETRON HCL 4 MG/2ML IJ SOLN
4.0000 mg | Freq: Four times a day (QID) | INTRAMUSCULAR | Status: DC | PRN
  Administered 2018-08-04 – 2018-08-05 (×2): 4 mg via INTRAVENOUS
  Filled 2018-08-03 (×2): qty 2

## 2018-08-03 MED ORDER — DILTIAZEM HCL-DEXTROSE 100-5 MG/100ML-% IV SOLN (PREMIX)
5.0000 mg/h | INTRAVENOUS | Status: DC
  Administered 2018-08-03: 5 mg/h via INTRAVENOUS
  Filled 2018-08-03: qty 100

## 2018-08-03 MED ORDER — NITROGLYCERIN 0.4 MG SL SUBL
SUBLINGUAL_TABLET | SUBLINGUAL | Status: AC
  Administered 2018-08-03: 0.4 mg
  Filled 2018-08-03: qty 1

## 2018-08-03 MED ORDER — SODIUM CHLORIDE 0.9 % IV SOLN
INTRAVENOUS | Status: DC | PRN
  Administered 2018-08-03 – 2018-08-04 (×2): 1000 mL via INTRAVENOUS

## 2018-08-03 MED ORDER — ONDANSETRON HCL 4 MG/2ML IJ SOLN
4.0000 mg | Freq: Once | INTRAMUSCULAR | Status: DC
  Filled 2018-08-03: qty 2

## 2018-08-03 MED ORDER — MORPHINE SULFATE (PF) 4 MG/ML IV SOLN
4.0000 mg | Freq: Once | INTRAVENOUS | Status: AC
  Administered 2018-08-03: 4 mg via INTRAVENOUS
  Filled 2018-08-03: qty 1

## 2018-08-03 MED ORDER — MORPHINE SULFATE (PF) 2 MG/ML IV SOLN
2.0000 mg | INTRAVENOUS | Status: DC | PRN
  Administered 2018-08-04: 4 mg via INTRAVENOUS
  Filled 2018-08-03: qty 2

## 2018-08-03 MED ORDER — FUROSEMIDE 40 MG PO TABS: 40.0000 mg | ORAL_TABLET | Freq: Two times a day (BID) | ORAL | Status: DC

## 2018-08-03 MED ORDER — NITROGLYCERIN IN D5W 200-5 MCG/ML-% IV SOLN
0.0000 ug/min | INTRAVENOUS | Status: DC
  Administered 2018-08-03: 5 ug/min via INTRAVENOUS
  Administered 2018-08-04: 65 ug/min via INTRAVENOUS
  Filled 2018-08-03 (×2): qty 250

## 2018-08-03 MED ORDER — CARVEDILOL 25 MG PO TABS
25.0000 mg | ORAL_TABLET | Freq: Two times a day (BID) | ORAL | Status: DC
  Administered 2018-08-03 – 2018-08-09 (×13): 25 mg via ORAL
  Filled 2018-08-03: qty 2
  Filled 2018-08-03 (×4): qty 1
  Filled 2018-08-03: qty 2
  Filled 2018-08-03 (×3): qty 1
  Filled 2018-08-03: qty 2
  Filled 2018-08-03 (×3): qty 1

## 2018-08-03 MED ORDER — IOPAMIDOL (ISOVUE-370) INJECTION 76%
100.0000 mL | Freq: Once | INTRAVENOUS | Status: AC | PRN
  Administered 2018-08-03: 100 mL via INTRAVENOUS

## 2018-08-03 MED ORDER — IOPAMIDOL (ISOVUE-370) INJECTION 76%
INTRAVENOUS | Status: AC
  Filled 2018-08-03: qty 100

## 2018-08-03 MED ORDER — RIVAROXABAN 20 MG PO TABS: 20.0000 mg | ORAL_TABLET | Freq: Every day | ORAL | Status: DC

## 2018-08-03 MED ORDER — PERFLUTREN LIPID MICROSPHERE
1.0000 mL | INTRAVENOUS | Status: AC | PRN
  Administered 2018-08-03: 2 mL via INTRAVENOUS
  Filled 2018-08-03: qty 10

## 2018-08-03 MED ORDER — LABETALOL HCL 5 MG/ML IV SOLN: 10.0000 mg | Freq: Once | INTRAVENOUS | Status: DC

## 2018-08-03 MED ORDER — INSULIN ASPART 100 UNIT/ML IV SOLN
10.0000 [IU] | Freq: Once | INTRAVENOUS | Status: AC
  Administered 2018-08-03: 10 [IU] via INTRAVENOUS
  Filled 2018-08-03: qty 0.1

## 2018-08-03 MED ORDER — SODIUM CHLORIDE 0.9% FLUSH
3.0000 mL | Freq: Two times a day (BID) | INTRAVENOUS | Status: DC
  Administered 2018-08-03: 3 mL via INTRAVENOUS

## 2018-08-03 MED ORDER — INSULIN ASPART 100 UNIT/ML ~~LOC~~ SOLN
0.0000 [IU] | Freq: Every day | SUBCUTANEOUS | Status: DC
  Administered 2018-08-03 – 2018-08-04 (×2): 2 [IU] via SUBCUTANEOUS
  Administered 2018-08-05: 3 [IU] via SUBCUTANEOUS
  Administered 2018-08-06: 2 [IU] via SUBCUTANEOUS
  Administered 2018-08-08: 3 [IU] via SUBCUTANEOUS

## 2018-08-03 MED ORDER — INSULIN ASPART 100 UNIT/ML ~~LOC~~ SOLN
0.0000 [IU] | Freq: Three times a day (TID) | SUBCUTANEOUS | Status: DC
  Administered 2018-08-03: 15 [IU] via SUBCUTANEOUS
  Administered 2018-08-03: 3 [IU] via SUBCUTANEOUS
  Administered 2018-08-04 (×2): 7 [IU] via SUBCUTANEOUS
  Administered 2018-08-05: 4 [IU] via SUBCUTANEOUS
  Administered 2018-08-05: 7 [IU] via SUBCUTANEOUS
  Administered 2018-08-05: 11 [IU] via SUBCUTANEOUS
  Administered 2018-08-06 (×2): 15 [IU] via SUBCUTANEOUS
  Administered 2018-08-07: 7 [IU] via SUBCUTANEOUS
  Administered 2018-08-07: 20 [IU] via SUBCUTANEOUS
  Administered 2018-08-07: 11 [IU] via SUBCUTANEOUS
  Administered 2018-08-08 – 2018-08-09 (×4): 7 [IU] via SUBCUTANEOUS
  Administered 2018-08-09: 4 [IU] via SUBCUTANEOUS
  Filled 2018-08-03: qty 1

## 2018-08-03 MED ORDER — HEPARIN BOLUS VIA INFUSION
4000.0000 [IU] | Freq: Once | INTRAVENOUS | Status: AC
  Administered 2018-08-03: 4000 [IU] via INTRAVENOUS
  Filled 2018-08-03: qty 4000

## 2018-08-03 MED ORDER — HEPARIN (PORCINE) 25000 UT/250ML-% IV SOLN
1600.0000 [IU]/h | INTRAVENOUS | Status: DC
  Administered 2018-08-04: 1600 [IU]/h via INTRAVENOUS
  Filled 2018-08-03: qty 250

## 2018-08-03 MED ORDER — SODIUM CHLORIDE 0.9 % IJ SOLN
INTRAMUSCULAR | Status: AC
  Filled 2018-08-03: qty 50

## 2018-08-03 MED ORDER — HEPARIN (PORCINE) 25000 UT/250ML-% IV SOLN
1300.0000 [IU]/h | INTRAVENOUS | Status: DC
  Administered 2018-08-03: 1300 [IU]/h via INTRAVENOUS
  Filled 2018-08-03: qty 250

## 2018-08-03 MED ORDER — FERROUS SULFATE 325 (65 FE) MG PO TABS
325.0000 mg | ORAL_TABLET | Freq: Two times a day (BID) | ORAL | Status: DC
  Administered 2018-08-03 – 2018-08-09 (×13): 325 mg via ORAL
  Filled 2018-08-03 (×13): qty 1

## 2018-08-03 MED ORDER — LOSARTAN POTASSIUM 50 MG PO TABS
100.0000 mg | ORAL_TABLET | Freq: Every day | ORAL | Status: DC
  Administered 2018-08-03 – 2018-08-04 (×2): 100 mg via ORAL
  Filled 2018-08-03 (×2): qty 2

## 2018-08-03 MED ORDER — DILTIAZEM LOAD VIA INFUSION
10.0000 mg | Freq: Once | INTRAVENOUS | Status: AC
  Administered 2018-08-03: 10 mg via INTRAVENOUS
  Filled 2018-08-03: qty 10

## 2018-08-03 MED ORDER — DILTIAZEM HCL ER COATED BEADS 240 MG PO CP24
240.0000 mg | ORAL_CAPSULE | Freq: Every day | ORAL | Status: DC
  Administered 2018-08-03 – 2018-08-05 (×3): 240 mg via ORAL
  Filled 2018-08-03: qty 1
  Filled 2018-08-03 (×2): qty 2

## 2018-08-03 MED ORDER — RISPERIDONE 1 MG PO TABS
2.0000 mg | ORAL_TABLET | Freq: Every day | ORAL | Status: DC
  Administered 2018-08-03 – 2018-08-09 (×7): 2 mg via ORAL
  Filled 2018-08-03 (×7): qty 2

## 2018-08-03 MED ORDER — OXYBUTYNIN CHLORIDE ER 5 MG PO TB24
15.0000 mg | ORAL_TABLET | Freq: Every day | ORAL | Status: DC
  Administered 2018-08-03 – 2018-08-08 (×6): 15 mg via ORAL
  Filled 2018-08-03 (×6): qty 3

## 2018-08-03 NOTE — Progress Notes (Signed)
Pt. Has had consistent high blood pressures in the 421I systolic. MD called. 10mg  of labetalol order. BP  Remained high and patient complaining of CP 8/10. Nitro given. CP decreased and BP 312O systolic. MD called. Waiting new orders.

## 2018-08-03 NOTE — Progress Notes (Signed)
[place] for heparin Indication: ACS/STEMI  Allergies  Allergen Reactions  . Acetaminophen Other (See Comments)    Seizure-like "fits" as a child  . Caffeine     Tense, anxiety, increased urination  . [place] [Dapagliflozin] Other (See Comments)    Hallucinations, drop in blood sugar  . Lisinopril Rash    Rash with lisinopril; but fosinopril is ok per patient   Patient Measurements:   Heparin Dosing Weight: 99.3kg  Vital Signs: Temp: 99.3 F (37.4 C) (11/06 1928) Temp Source: Oral (11/06 1928) BP: 169/107 (11/06 1900) Pulse Rate: 86 (11/06 1900)  Labs: Recent Labs    08/03/18 0247 08/03/18 1308 08/03/18 1429 08/03/18 1549 08/03/18 2039  HGB 10.2*  --   --   --   --   HCT 34.2*  --   --   --   --   PLT 288  --   --   --   --   APTT  --   --  31  --   --   LABPROT  --   --  13.8  --   --   INR  --   --  1.07  --   --   HEPARINUNFRC  --   --  <0.10*  --  0.21*  CREATININE 0.77  --   --   --   --   TROPONINI  --  1.33*  --  2.14*  --    CrCl cannot be calculated (Unknown ideal weight.).  Medical History: Past Medical History:  Diagnosis Date  . Acute on chronic diastolic congestive heart failure ([place]) 11/02/2013   10/03/2015, 11/13/2015, 08/03/2017  . Benign essential HTN 11/28/2013  . Bipolar disease, chronic ([place])   . Chest pain    a. 2012 Myoview: EF 63%, no isch/infarct;  b. 04/2016 Lexiscan MV: EF 73%, no ischemia/infarct-->Low risk.  . Chronic diastolic CHF (congestive heart failure) ([place]) 07/23/2011   a. 2015 Echo: EF 55-60%, Gr2 DD;  b. 09/2015 Echo: EF 60-65%, no rwma, mod dil LA, PASP 75mmHg.  . Cor pulmonale (chronic) ([place])   . History of thyrotoxicosis   . HTN (hypertension) 11/28/2013  . Hypertensive heart disease 10/18/2013  . Hypoglycemia   . Insulin dependent type 2 diabetes mellitus, uncontrolled ([place])   . Mediastinal adenopathy   . Morbid obesity due to excess calories ([place]) 02/19/2011  . Morbid obesity  with BMI of 50.0-59.9, adult ([place])   . OSA (obstructive sleep apnea) 03/06/2011  . Persistent atrial fibrillation 12/09/2017  . Pulmonary HTN, moderate to severe 11/03/2013  . Sinusitis, chronic 01/02/2015  . SVT (supraventricular tachycardia) ([place]) 12/06/2013  . Uncontrolled type 2 diabetes mellitus with hyperglycemia [facility])    Assessment:  52 y.o. female with medical history significant of A. fib, chronic diastolic heart failure, hypertension, diabetes, morbid obesity, pulmonary hypertension who presented from home to the emergency department with complaints of shortness of breath and chest pain which started at 9 PM yesterday. Recently admitted here for GI bleed was advised to continue her xarelto but she was under the impression that she was to stop it>>has not been taking for the last 2 weeks.  Pharmacy consulted to dose heparin for ACS/STEMI.   Baseline Heparin level & aPTT wnl, okay to begin Heparin 4000 unit bolus, infusion at 1300 units/hr  Today, 08/03/2018   First Heparin level 0.21 units/ml, sub therapeutic  No problems noted with infusion  Goal of Therapy:  Heparin level 0.3-0.7 units/ml  Monitor platelets by anticoagulation protocol   Plan:   Increase Heparin to 1600 units/hr  Next Heparin level in 6 hr after rate change  Daily CBC, order daily Heparin level when at steady state  Minda Ditto PharmD Pager [phone] 08/03/2018, 9:28 PM

## 2018-08-03 NOTE — Care Management Note (Signed)
Case Management Note  Patient Details  Name: [name] MRN: [id number] Date of Birth: August 22, 1966  Subjective/Objective:                  A. Fib with rvr/iv cardizem  Action/Plan: Following for progression of care and condition. Following for cm needs none present at this time.  Expected Discharge Date:                  Expected Discharge Plan:  Home/Self Care  In-House Referral:     Discharge planning Services  CM Consult  Post Acute Care Choice:    Choice offered to:     DME Arranged:    DME Agency:     HH Arranged:    HH Agency:     Status of Service:  In process, will continue to follow  If discussed at Long Length of Stay Meetings, dates discussed:    Additional Comments:  Leeroy Cha, RN 08/03/2018, 9:27 AM

## 2018-08-03 NOTE — Significant Event (Signed)
Patient again starting having chest pain.  Troponin checked and it was elevated to 1.33.  EKG shows possible anterior ischemia.  Urgent cardiology evaluation requested.  Will discontinue Xarelto and start on heparin drip for now.

## 2018-08-03 NOTE — Progress Notes (Signed)
CRITICAL VALUE ALERT  Critical Value:  Troponin 1.33  Date & Time Notied:  08/03/18 14:00  Provider Notified:Dr. Tawanna Solo  Orders Received/Actions taken: MD called. Waiting new orders

## 2018-08-03 NOTE — Progress Notes (Signed)
  Echocardiogram 2D Echocardiogram has been performed.  [name] 08/03/2018, 4:29 PM

## 2018-08-03 NOTE — Progress Notes (Signed)
ANTICOAGULATION CONSULT NOTE - Initial Consult  Pharmacy Consult for heparin Indication: ACS/STEMI  Allergies  Allergen Reactions  . Acetaminophen Other (See Comments)    Seizure-like "fits" as a child  . Caffeine     Tense, anxiety, increased urination  . [place] [Dapagliflozin] Other (See Comments)    Hallucinations, drop in blood sugar  . Lisinopril Rash    Rash with lisinopril; but fosinopril is ok per patient    Patient Measurements:   Heparin Dosing Weight: 99.3kg  Vital Signs: Temp: 98.1 F (36.7 C) (11/06 1200) Temp Source: Oral (11/06 1200) BP: 184/107 (11/06 1340) Pulse Rate: 82 (11/06 1340)  Labs: Recent Labs    08/03/18 0247 08/03/18 1308  HGB 10.2*  --   HCT 34.2*  --   PLT 288  --   CREATININE 0.77  --   TROPONINI  --  1.33*    CrCl cannot be calculated (Unknown ideal weight.).   Medical History: Past Medical History:  Diagnosis Date  . Acute on chronic diastolic congestive heart failure ([place]) 11/02/2013   10/03/2015, 11/13/2015, 08/03/2017  . Benign essential HTN 11/28/2013  . Bipolar disease, chronic ([place])   . Chest pain    a. 2012 Myoview: EF 63%, no isch/infarct;  b. 04/2016 Lexiscan MV: EF 73%, no ischemia/infarct-->Low risk.  . Chronic diastolic CHF (congestive heart failure) ([place]) 07/23/2011   a. 2015 Echo: EF 55-60%, Gr2 DD;  b. 09/2015 Echo: EF 60-65%, no rwma, mod dil LA, PASP 15mmHg.  . Cor pulmonale (chronic) ([place])   . History of thyrotoxicosis   . HTN (hypertension) 11/28/2013  . Hypertensive heart disease 10/18/2013  . Hypoglycemia   . Insulin dependent type 2 diabetes mellitus, uncontrolled ([place])   . Mediastinal adenopathy   . Morbid obesity due to excess calories ([place]) 02/19/2011  . Morbid obesity with BMI of 50.0-59.9, adult ([place])   . OSA (obstructive sleep apnea) 03/06/2011  . Persistent atrial fibrillation 12/09/2017  . Pulmonary HTN, moderate to severe 11/03/2013  . Sinusitis, chronic 01/02/2015  . SVT (supraventricular  tachycardia) ([place]) 12/06/2013  . Uncontrolled type 2 diabetes mellitus with hyperglycemia [facility])       Assessment:  52 y.o. female with medical history significant of A. fib, chronic diastolic heart failure, hypertension, diabetes, morbid obesity, pulmonary hypertension who presented from home to the emergency department with complaints of shortness of breath and chest pain which started at 9 PM yesterday. Recently admitted here for GI bleed was advised to continue her xarelto but she was under the impression that she was to stop it>>has not been taking for the last 2 weeks.  Pharmacy consulted to dose heparin for ACS/STEMI.  08/03/2018 Troponin 1.33 Plts WNL H/H WNL HL, aPTT, pt/INR ordered stat  Goal of Therapy:  Heparin level 0.3-0.7 units/ml Monitor platelets by anticoagulation protocol   Plan:  Heparin bolus 4000 units x 1 then start Heparin drip at 1300 units/hr Heparin level in 6 hours Daily CBC  Dolly Rias RPh 08/03/2018, 2:26 PM Pager [phone]

## 2018-08-03 NOTE — Consult Note (Addendum)
Cardiology Consultation:   Patient ID: [name]; [id number]; 1966/02/03   Admit date: 08/03/2018 Date of Consult: 08/03/2018  Primary Care Provider: [name], NP Primary Cardiologist: Dr [name], 04/05/2018 in-hospital [name], [facility] 06/14/2018 Primary Electrophysiologist: Maximino Greenland, NP   Patient Profile:   [name] is a 52 y.o. female with a hx of D-CHF, persistent Afib w/ failed DCCV 02/2018 & 04/05/2018, HTN,obesity,MV 2017 w/ nl perfusionand EF 73%, SVT, chronic intermittent chest pain, CHA2DS2-VASc =3 (female, CHF, HTN) on Xarelto, OSA not on CPAP, mediastinal adenopathy, basilar groundglass infiltrates, and prior Mycobacterium avium infection who is being seen today for the evaluation of chest pain and elevated trop at the request of Dr Tawanna Solo.  History of Present Illness:   Ms. Gruhn was seen 09/17 and was doing well from a cardiac standpoint, wt 332, Cardizem increased to 240 mg (but pt never did this), BMET stable.  Admitted 10/17-10/19/2019 with rectal, lower GI bleed, H&H 10.6>>9.5 but then stabilized. GI saw>>colonoscopy>>2 bleeding polyps removed>>ok to restart Xarelto (pt never did).  10/23 [facility] visit, pt c/o fatigue, H&H improved, start B12 injections, not taking Cardizem or Xarelto, wt 333.   Pt came to the ER because of increasing SOB, says wt no increase on home scales, says has been 333 lbs. Says has been taking Cardizem, not taking Xarelto.   Says is taking Lasix bid and not eating salt. She cooks w/out salt, is using lemon for flavor.   She normally drinks about 3 L or so daily.  Yesterday, she was fine in the am, she drank a lot of tea. She ate a doughnut.   Last pm at 9 pm, she had sudden onset of neck spasm and SOB, very painful. She also had L chest pain, worse w/ deep inspiration, stabbing, better w/ leaning forward. The sx continued and got worse. By 2 am, she could not stand it any more and came to the ER. 10/10 at its  worst.   In the ER, she got IV Cardizem, IV Morphine 4 mg, Zofran 4 mg. She says the morphine took the pain to a 0/10 but it came back. After the pain came back, she got SL NTG x 2, pain is now 7/10.   BP elevated on arrival, as high as 193/124. She has had Coreg 25 mg, Cardizem CD 240 mg, losartan 100 mg. BP is still very high, she has been started on IV nitro for the chest pain and the BP.  This is the same pain that she has been having. It is just worse now.   Her Mg was noted to be low, she has gotten IV rx for that.    Past Medical History:  Diagnosis Date  . Acute on chronic diastolic congestive heart failure ([place]) 11/02/2013   10/03/2015, 11/13/2015, 08/03/2017  . Benign essential HTN 11/28/2013  . Bipolar disease, chronic ([place])   . Chest pain    a. 2012 Myoview: EF 63%, no isch/infarct;  b. 04/2016 Lexiscan MV: EF 73%, no ischemia/infarct-->Low risk.  . Chronic diastolic CHF (congestive heart failure) ([place]) 07/23/2011   a. 2015 Echo: EF 55-60%, Gr2 DD;  b. 09/2015 Echo: EF 60-65%, no rwma, mod dil LA, PASP 1mmHg.  . Cor pulmonale (chronic) ([place])   . History of thyrotoxicosis   . HTN (hypertension) 11/28/2013  . Hypertensive heart disease 10/18/2013  . Hypoglycemia   . Insulin dependent type 2 diabetes mellitus, uncontrolled ([place])   . Mediastinal adenopathy   . Morbid obesity due  to excess calories ([place]) 02/19/2011  . Morbid obesity with BMI of 50.0-59.9, adult ([place])   . OSA (obstructive sleep apnea) 03/06/2011  . Persistent atrial fibrillation 12/09/2017  . Pulmonary HTN, moderate to severe 11/03/2013  . Sinusitis, chronic 01/02/2015  . SVT (supraventricular tachycardia) ([place]) 12/06/2013  . Uncontrolled type 2 diabetes mellitus with hyperglycemia [facility])     Past Surgical History:  Procedure Laterality Date  . CARDIOVERSION N/A 04/05/2018   Procedure: CARDIOVERSION;  Surgeon: Lelon Perla, MD;  Location: [facility] ENDOSCOPY;  Service: Cardiovascular;  Laterality: N/A;  . COLONOSCOPY  WITH PROPOFOL Left 07/16/2018   Procedure: COLONOSCOPY WITH PROPOFOL;  Surgeon: Ronnette Juniper, MD;  Location: [facility] ENDOSCOPY;  Service: Gastroenterology;  Laterality: Left;  . None    . POLYPECTOMY  07/16/2018   Procedure: POLYPECTOMY;  Surgeon: Ronnette Juniper, MD;  Location: [facility] ENDOSCOPY;  Service: Gastroenterology;;     Prior to Admission medications   Medication Sig Start Date End Date Taking? Authorizing Provider  albuterol (VENTOLIN HFA) 108 (90 Base) MCG/ACT inhaler INHALE 1 TO 2 PUFFS EVERY 6 HOURS AS NEEDED FOR WHEEZING/ SHORTNESS OF BREATH 06/21/18  Yes [name], NP  carvedilol (COREG) 25 MG tablet TAKE 1 TABLET BY MOUTH 2 TIMES DAILY. 04/18/18  Yes Lelon Perla, MD  diltiazem (CARDIZEM CD) 240 MG 24 hr capsule Take 1 capsule (240 mg total) by mouth daily. 06/14/18 09/12/18 Yes Barrett, Evelene Croon, PA-C  ferrous sulfate 325 (65 FE) MG tablet Take 1 tablet (325 mg total) by mouth 2 (two) times daily. 04/15/18  Yes Bhagat, Bhavinkumar, PA  furosemide (LASIX) 40 MG tablet Take 1 tablet (40 mg total) by mouth 2 (two) times daily. 04/16/18  Yes Isaiah Serge, NP  glipiZIDE (GLIPIZIDE XL) 10 MG 24 hr tablet Take 1 tablet (10 mg total) by mouth daily with breakfast. 06/21/18  Yes [name], NP  losartan (COZAAR) 100 MG tablet Take 1 tablet (100 mg total) by mouth daily. 06/21/18 09/19/18 Yes [name], NP  metFORMIN (GLUCOPHAGE) 1000 MG tablet Take 1 tablet by mouth 2 (two) times daily with a meal. 06/21/18  Yes [provider]  oxybutynin (DITROPAN XL) 15 MG 24 hr tablet Take 1 tablet (15 mg total) by mouth at bedtime. 06/21/18  Yes [name], NP  polyethylene glycol (MIRALAX / GLYCOLAX) packet Take 17 g by mouth daily as needed for mild constipation. 07/16/18  Yes Patrecia Pour, Christean Grief, MD  potassium chloride SA (K-DUR,KLOR-CON) 20 MEQ tablet Take 1 tablet (20 mEq total) by mouth 2 (two) times daily. 04/15/18  Yes Bhagat, Bhavinkumar, PA  risperiDONE (RISPERDAL) 2 MG  tablet Take 1 tablet (2 mg total) by mouth daily. 05/27/18  Yes [name], NP  ergocalciferol (DRISDOL) 50000 units capsule Take 1 capsule (50,000 Units total) by mouth once a week. 07/21/18   Charlott Rakes, MD  glucose blood test strip Use as instructed. Check blood glucose levels by fingerstick twice per day 06/21/18   [name], NP  rivaroxaban (XARELTO) 20 MG TABS tablet Take 1 tablet (20 mg total) by mouth daily with supper. Patient not taking: Reported on 07/20/2018 07/17/18 08/16/18  Doreatha Lew, MD    Inpatient Medications: Scheduled Meds: . carvedilol  25 mg Oral BID  . diltiazem  240 mg Oral Daily  . ferrous sulfate  325 mg Oral BID  . furosemide  40 mg Intravenous Once  . furosemide  40 mg Oral BID  . insulin aspart  0-20 Units Subcutaneous TID WC  . insulin aspart  0-5 Units Subcutaneous QHS  . iopamidol      . labetalol  10 mg Intravenous Once  . losartan  100 mg Oral Daily  . ondansetron (ZOFRAN) IV  4 mg Intravenous Once  . oxybutynin  15 mg Oral QHS  . risperiDONE  2 mg Oral Daily  . sodium chloride       Continuous Infusions: . sodium chloride 10 mL/hr at 08/03/18 1200  . diltiazem (CARDIZEM) infusion Stopped (08/03/18 1001)  . heparin 1,300 Units/hr (08/03/18 1441)  . nitroGLYCERIN 20 mcg/min (08/03/18 1523)   PRN Meds: sodium chloride, labetalol, nitroGLYCERIN, polyethylene glycol  Allergies:    Allergies  Allergen Reactions  . Acetaminophen Other (See Comments)    Seizure-like "fits" as a child  . Caffeine     Tense, anxiety, increased urination  . [place] [Dapagliflozin] Other (See Comments)    Hallucinations, drop in blood sugar  . Lisinopril Rash    Rash with lisinopril; but fosinopril is ok per patient   Social History:   Social History   Socioeconomic History  . Marital status: Single    Spouse name: Not on file  . Number of children: 0  . Years of education: 75  . Highest education level: Not on file  Occupational  History  . Occupation: unemployed  Social Needs  . Financial resource strain: Not hard at all  . Food insecurity:    Worry: Never true    Inability: Never true  . Transportation needs:    Medical: No    Non-medical: No  Tobacco Use  . Smoking status: Never Smoker  . Smokeless tobacco: Never Used  Substance and Sexual Activity  . Alcohol use: No  . Drug use: No  . Sexual activity: Not Currently    Birth control/protection: None  Lifestyle  . Physical activity:    Days per week: Not on file    Minutes per session: Not on file  . Stress: Only a little  Relationships  . Social connections:    Talks on phone: Not on file    Gets together: Not on file    Attends religious service: Not on file    Active member of club or organization: Not on file    Attends meetings of clubs or organizations: Not on file    Relationship status: Not on file  . Intimate partner violence:    Fear of current or ex partner: Not on file    Emotionally abused: Not on file    Physically abused: Not on file    Forced sexual activity: Not on file  Other Topics Concern  . 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 But ran out of money and is no longer attending school. (Note patient has bipolar disorder).      Born in [place] but lived in [place] before coming back to [place].       Primary language is Arabic. Lives with mother and brother.    Family History:   Family History  Problem Relation Age of Onset  . Heart failure Father   . Stroke Father   . Hypertension Mother   . Heart disease Maternal Grandfather    Family Status:  Family Status  Relation Name Status  . Father  Deceased at age 58  Died from stroke. Also had heart failure  . Mother  Alive       HTN  . Brother  (Not Specified)       HTN  . MGF  Deceased  . MGM  Deceased  . PGM  Deceased  . PGF  Deceased    ROS:    Please see the history of present illness.  All other ROS reviewed and negative.     Physical Exam/Data:   Vitals:   08/03/18 1508 08/03/18 1516 08/03/18 1519 08/03/18 1522  BP: (!) 201/116 (!) 194/122  (!) 183/115  Pulse: 71 82  84  Resp: 11 15  20   Temp:   97.7 F (36.5 C)   TempSrc:   Oral   SpO2: 99% 100%  99%    Intake/Output Summary (Last 24 hours) at 08/03/2018 1523 Last data filed at 08/03/2018 1200 Gross per 24 hour  Intake 80.76 ml  Output 400 ml  Net -319.24 ml   There were no vitals filed for this visit. There is no height or weight on file to calculate BMI.  General:  Well nourished, super morbidly obese, in no acute distress HEENT: normal Lymph: no adenopathy Neck: no JVD seen, difficult to assess 2nd body habitus Endocrine:  No thryomegaly Vascular: No carotid bruits; 4/4 extremity pulses 2+, without bruits  Cardiac:  normal S1, S2; Irreg R&R; no murmur  Lungs: decreased BS bases bilaterally, no wheezing, rhonchi   Abd: soft, nontender, no hepatomegaly  Ext: no edema Musculoskeletal:  No deformities, BUE and BLE strength normal and equal Skin: warm and dry  Neuro:  CNs 2-12 intact, no focal abnormalities noted Psych:  Normal affect   EKG:  The EKG was personally reviewed and demonstrates:  Atrial fib, HR 93, no acute ischemic changes, no change from 09/12 Telemetry:  Telemetry was personally reviewed and demonstrates:  Atrial fib, rate control improved after BB and CCB.  Relevant CV Studies:  ECHO:  CATH:   Laboratory Data:  Chemistry Recent Labs  Lab 08/03/18 0247  NA 137  K 3.9  CL 101  CO2 24  GLUCOSE 416*  BUN 14  CREATININE 0.77  CALCIUM 9.4  GFRNONAA >60  GFRAA >60  ANIONGAP 12    Lab Results  Component Value Date   ALT 14 07/14/2018   AST 26 07/14/2018   ALKPHOS 98 07/14/2018   BILITOT 0.7 07/14/2018   Hematology Recent Labs  Lab 08/03/18 0247  WBC 7.0  RBC 3.76*  HGB 10.2*  HCT 34.2*  MCV 91.0  MCH 27.1  MCHC  29.8*  RDW 15.9*  PLT 288   Cardiac Enzymes Recent Labs  Lab 08/03/18 1308  TROPONINI 1.33*    Recent Labs  Lab 08/03/18 0304  TROPIPOC 0.02    BNP Recent Labs  Lab 08/03/18 0304  BNP 493.9*    TSH:  Lab Results  Component Value Date   TSH 2.910 12/09/2017   Lipids: Lab Results  Component Value Date   CHOL 153 06/21/2018   HDL 45 06/21/2018   LDLCALC 68 06/21/2018   TRIG 199 (H) 06/21/2018   CHOLHDL 3.4 06/21/2018   HgbA1c: Lab Results  Component Value Date   HGBA1C 9.2 (A) 06/21/2018   Magnesium:  Magnesium  Date Value Ref Range Status  08/03/2018 1.6 (L) 1.7 - 2.4 mg/dL Final    Comment:    Performed at [facility], [place] [address] [place], [place] [address]     Radiology/Studies:  Dg  Chest 2 View  Result Date: 08/03/2018 CLINICAL DATA:  Chest pain and shortness of breath beginning 8 hours ago. EXAM: CHEST - 2 VIEW COMPARISON:  04/13/2018 FINDINGS: Shallow inspiration. Cardiac enlargement. No vascular congestion, edema, or consolidation. No blunting of costophrenic angles. No pneumothorax. Mediastinal contours appear intact. Soft tissue attenuation limits examination. IMPRESSION: Cardiac enlargement. No evidence of active pulmonary disease. Electronically Signed   By: Lucienne Capers M.D.   On: 08/03/2018 03:29   Ct Angio Chest Pe W And/or Wo Contrast  Result Date: 08/03/2018 CLINICAL DATA:  Left chest pain, worse with palpation. EXAM: CT ANGIOGRAPHY CHEST WITH CONTRAST TECHNIQUE: Multidetector CT imaging of the chest was performed using the standard protocol during bolus administration of intravenous contrast. Multiplanar CT image reconstructions and MIPs were obtained to evaluate the vascular anatomy. CONTRAST:  146mL ISOVUE-370 IOPAMIDOL (ISOVUE-370) INJECTION 76% COMPARISON:  12/23/2017 FINDINGS: Cardiovascular: Good opacification of the central and segmental pulmonary arteries. No focal filling defects. No evidence of significant  pulmonary embolus. Cardiac enlargement. Normal caliber thoracic aorta. No pericardial effusions. Mediastinum/Nodes: Prominent mediastinal lymph nodes, similar to prior study. Likely reactive. Lungs/Pleura: Calcified granulomas. No airspace disease or consolidation. No pleural effusions. No pneumothorax. Upper Abdomen: No acute abnormalities demonstrated. Musculoskeletal: No chest wall abnormality. No acute or significant osseous findings. Review of the MIP images confirms the above findings. IMPRESSION: No evidence of significant pulmonary embolus. Cardiac enlargement. Lungs clear. Electronically Signed   By: Lucienne Capers M.D.   On: 08/03/2018 05:44    Assessment and Plan:   1. Principal Problem:   Persistent atrial fibrillation Active Problems:   Morbid obesity due to excess calories (HCC)   Chronic diastolic heart failure ([facility])   Pulmonary HTN, moderate to severe   Benign essential HTN   Chest pain   Uncontrolled type 2 diabetes mellitus with hyperglycemia ([place])   A-fib ([place])     For questions or updates, please contact [facility] Please consult [email or web address] for contact info under Cardiology/STEMI.   Signed, [name], PA-C  08/03/2018 3:23 PM As above, patient seen and examined.  Briefly female past medical history chronic diastolic congestive heart failure, persistent atrial fibrillation with history of failed cardioversion, hypertension, obesity, recurrent chest pain, obstructive sleep apnea for evaluation of chest pain/non-ST elevation myocardial infarction.  Patient has a long history of recurrent chest pain.  She had a nuclear study in August 2017 showed ejection fraction 73% and normal perfusion.  Echocardiogram July 2019 showed normal LV function, mild mitral regurgitation, mild biatrial enlargement and moderate tricuspid regurgitation.  We have recommended cardiac catheterization in the past for definitive evaluation given patient's multiple risk factors but she has  declined.  She states she developed recurrent chest pain last evening in the left chest area radiating to her left axilla.  The pain increases with inspiration and with swallowing.  She presented to the emergency room as her pain improved with morphine.  However it returned this morning and follow-up troponin was abnormal.  Cardiology asked to evaluate.  Patient states that her pain is improving at time of evaluation.  On exam her pain is improved pressing on her chest. Chest CTA showed no pulmonary embolus.  No pericardial effusion. Labs show BUN 14 and creatinine 0.77.  BNP 493.  Initial troponin 0 0.02 with follow-up 1.33.  Hemoglobin 10.2 and platelet count 280,000.  Electrocardiogram shows atrial fibrillation and prior septal infarct cannot be excluded.  No significant ST changes.  1 chest pain/non-ST elevation myocardial infarction-difficult situation.  Patient  symptoms are extremely atypical.  They increase with inspiration and improve with pressure on her chest.  Electrocardiogram shows no ST changes.  However troponin is mildly elevated.  We will treat with aspirin, heparin, beta-blocker and statin.  She will need cardiac catheterization.  The risks and benefits including myocardial infarction, CVA and death discussed and she agrees to proceed.  We will hold diuretics prior to procedure.  Resume afterwards.  Check echocardiogram for wall motion and LV function.  Continue to cycle enzymes.  2 persistent atrial fibrillation-continue carvedilol and Cardizem for rate control. CHADSvasc 4.  She has not been taking her Xarelto as she was unclear if she was to resume following recent removal of polyps.  We will continue heparin for now.  Resume Xarelto prior to discharge once all procedures complete.  3 hypertension-blood pressure is elevated.  We will continue with Cardizem, carvedilol and losartan.  Continue IV nitroglycerin.  Hold Lasix for now until after cardiac catheterization.  Follow blood pressure  and add medications as needed.  4 chronic diastolic congestive heart failure-she appears to be euvolemic on examination.  Hold Lasix prior to catheterization and resume afterwards at present dose.  [name], MD  Follow-up troponin 2.14.  However patient is pain-free.  Plan continue medical therapy and proceed with cardiac catheterization tomorrow.  Blood pressure remains high.  Add hydralazine 25 mg p.o. 3 times daily and increase as needed.  Note echocardiogram shows normal LV function. [name], MD

## 2018-08-03 NOTE — ED Notes (Signed)
ED TO INPATIENT HANDOFF REPORT  Name/Age/Gender [name] 52 y.o. female  Code Status Code Status History    Date Active Date Inactive Code Status Order ID Comments User Context   07/14/2018 2036 07/16/2018 1800 Full Code [id number]  Shelbie Proctor, MD Inpatient   04/07/2018 2002 04/16/2018 1816 Full Code [id number]  [name] Inpatient   08/03/2017 0910 08/13/2017 2107 Full Code [id number]  [name], MD Inpatient   11/12/2015 0214 11/21/2015 1709 Full Code [id number]  [name], MD Inpatient   10/04/2015 0027 10/06/2015 1721 Full Code [id number]  [name], MD ED   12/06/2013 2352 12/10/2013 1433 Full Code [id number]  [name], MD ED   11/03/2013 0019 11/07/2013 1923 Full Code [id number]  [name], MD Inpatient   06/14/2012 2322 06/15/2012 2032 Full Code [id number]  [name], MD ED      Home/SNF/Other Home  Chief Complaint Chest Pain  Level of Care/Admitting Diagnosis ED Disposition    ED Disposition Condition [facility] Area: [facility] [[id number]]  Level of Care: Stepdown [14]  Admit to SDU based on following criteria: Cardiac Instability:  Patients experiencing chest pain, unconfirmed MI and stable, arrhythmias and CHF requiring medical management and potentially compromising patient's stability  Diagnosis: Acute CHF (congestive heart failure) [facility]) [[id number]]  Admitting Physician: Rise Patience [phone]  Attending Physician: Rise Patience [phone]  Estimated length of stay: past midnight tomorrow  Certification:: I certify this patient will need inpatient services for at least 2 midnights  PT Class (Do Not Modify): Inpatient [101]  PT Acc Code (Do Not Modify): Private [1]       Medical History Past Medical History:  Diagnosis Date  . Acute on chronic diastolic congestive heart failure ([place]) 11/02/2013   10/03/2015, 11/13/2015, 08/03/2017  . Benign essential HTN 11/28/2013  . Bipolar  disease, chronic ([place])   . Chest pain    a. 2012 Myoview: EF 63%, no isch/infarct;  b. 04/2016 Lexiscan MV: EF 73%, no ischemia/infarct-->Low risk.  . Chronic diastolic CHF (congestive heart failure) ([place]) 07/23/2011   a. 2015 Echo: EF 55-60%, Gr2 DD;  b. 09/2015 Echo: EF 60-65%, no rwma, mod dil LA, PASP 35mHg.  . Cor pulmonale (chronic) ([place]   . History of thyrotoxicosis   . HTN (hypertension) 11/28/2013  . Hypertensive heart disease 10/18/2013  . Hypoglycemia   . Insulin dependent type 2 diabetes mellitus, uncontrolled ([place]   . Mediastinal adenopathy   . Morbid obesity due to excess calories ([place] 02/19/2011  . Morbid obesity with BMI of 50.0-59.9, adult ([place]   . OSA (obstructive sleep apnea) 03/06/2011  . Persistent atrial fibrillation 12/09/2017  . Pulmonary HTN, moderate to severe 11/03/2013  . Sinusitis, chronic 01/02/2015  . SVT (supraventricular tachycardia) ([place] 12/06/2013  . Uncontrolled type 2 diabetes mellitus with hyperglycemia (HCC)     Allergies Allergies  Allergen Reactions  . Acetaminophen Other (See Comments)    Seizure-like "fits" as a child  . Caffeine     Tense, anxiety, increased urination  . FIran[Dapagliflozin] Other (See Comments)    Hallucinations, drop in blood sugar  . Lisinopril Rash    Rash with lisinopril; but fosinopril is ok per patient    IV Location/Drains/Wounds Patient Lines/Drains/Airways Status   Active Line/Drains/Airways    Name:   Placement date:   Placement time:   Site:   Days:   Peripheral IV 08/03/18 Left Antecubital   08/03/18    0244  Antecubital   less than 1   Peripheral IV 08/03/18 Left Hand   08/03/18    0445    Hand   less than 1          Labs/Imaging Results for orders placed or performed during the hospital encounter of 08/03/18 (from the past 48 hour(s))  Basic metabolic panel     Status: Abnormal   Collection Time: 08/03/18  2:47 AM  Result Value Ref Range   Sodium 137 135 - 145 mmol/L   Potassium 3.9 3.5 - 5.1  mmol/L   Chloride 101 98 - 111 mmol/L   CO2 24 22 - 32 mmol/L   Glucose, Bld 416 (H) 70 - 99 mg/dL   BUN 14 6 - 20 mg/dL   Creatinine, Ser 0.77 0.44 - 1.00 mg/dL   Calcium 9.4 8.9 - 10.3 mg/dL   GFR calc non Af Amer >60 >60 mL/min   GFR calc Af Amer >60 >60 mL/min    Comment: (NOTE) The eGFR has been calculated using the CKD EPI equation. This calculation has not been validated in all clinical situations. eGFR's persistently <60 mL/min signify possible Chronic Kidney Disease.    Anion gap 12 5 - 15    Comment: Performed at [facility], [place] [address] [place], [place] [address]  CBC     Status: Abnormal   Collection Time: 08/03/18  2:47 AM  Result Value Ref Range   WBC 7.0 4.0 - 10.5 K/uL   RBC 3.76 (L) 3.87 - 5.11 MIL/uL   Hemoglobin 10.2 (L) 12.0 - 15.0 g/dL   HCT 34.2 (L) 36.0 - 46.0 %   MCV 91.0 80.0 - 100.0 fL   MCH 27.1 26.0 - 34.0 pg   MCHC 29.8 (L) 30.0 - 36.0 g/dL   RDW 15.9 (H) 11.5 - 15.5 %   Platelets 288 150 - 400 K/uL   nRBC 0.0 0.0 - 0.2 %    Comment: Performed at [facility], [place] [address], [place], [place] [address]  Differential     Status: Abnormal   Collection Time: 08/03/18  2:47 AM  Result Value Ref Range   Neutrophils Relative % 85 %   Neutro Abs 5.9 1.7 - 7.7 K/uL   Lymphocytes Relative 9 %   Lymphs Abs 0.6 (L) 0.7 - 4.0 K/uL   Monocytes Relative 4 %   Monocytes Absolute 0.3 0.1 - 1.0 K/uL   Eosinophils Relative 0 %   Eosinophils Absolute 0.0 0.0 - 0.5 K/uL   Basophils Relative 0 %   Basophils Absolute 0.1 0.0 - 0.1 K/uL    Comment: Performed at [facility], [place] [address], [place], [place] [address]  I-Stat Troponin, ED (not at [facility])     Status: None   Collection Time: 08/03/18  3:04 AM  Result Value Ref Range   Troponin i, poc 0.02 0.00 - 0.08 ng/mL   Comment 3            Comment: Due to the release kinetics of cTnI, a negative result within the first hours of the onset of  symptoms does not rule out myocardial infarction with certainty. If myocardial infarction is still suspected, repeat the test at appropriate intervals.   Brain natriuretic peptide     Status: Abnormal   Collection Time: 08/03/18  3:04 AM  Result Value Ref Range   B Natriuretic Peptide 493.9 (H) 0.0 - 100.0 pg/mL    Comment: Performed at [facility], [place] [address], [place], [place] [address]  I-Stat beta hCG blood, ED     Status: None   Collection Time: 08/03/18  3:17 AM  Result Value Ref Range   I-stat hCG, quantitative <5.0 <5 mIU/mL   Comment 3            Comment:   GEST. AGE      CONC.  (mIU/mL)   <=1 WEEK        5 - 50     2 WEEKS       50 - 500     3 WEEKS       100 - 10,000     4 WEEKS     1,000 - 30,000        FEMALE AND NON-PREGNANT FEMALE:     LESS THAN 5 mIU/mL   POC CBG, ED     Status: Abnormal   Collection Time: 08/03/18  6:12 AM  Result Value Ref Range   Glucose-Capillary 225 (H) 70 - 99 mg/dL   Dg Chest 2 View  Result Date: 08/03/2018 CLINICAL DATA:  Chest pain and shortness of breath beginning 8 hours ago. EXAM: CHEST - 2 VIEW COMPARISON:  04/13/2018 FINDINGS: Shallow inspiration. Cardiac enlargement. No vascular congestion, edema, or consolidation. No blunting of costophrenic angles. No pneumothorax. Mediastinal contours appear intact. Soft tissue attenuation limits examination. IMPRESSION: Cardiac enlargement. No evidence of active pulmonary disease. Electronically Signed   By: Lucienne Capers M.D.   On: 08/03/2018 03:29   Ct Angio Chest Pe W And/or Wo Contrast  Result Date: 08/03/2018 CLINICAL DATA:  Left chest pain, worse with palpation. EXAM: CT ANGIOGRAPHY CHEST WITH CONTRAST TECHNIQUE: Multidetector CT imaging of the chest was performed using the standard protocol during bolus administration of intravenous contrast. Multiplanar CT image reconstructions and MIPs were obtained to evaluate the vascular anatomy. CONTRAST:  184m ISOVUE-370  IOPAMIDOL (ISOVUE-370) INJECTION 76% COMPARISON:  12/23/2017 FINDINGS: Cardiovascular: Good opacification of the central and segmental pulmonary arteries. No focal filling defects. No evidence of significant pulmonary embolus. Cardiac enlargement. Normal caliber thoracic aorta. No pericardial effusions. Mediastinum/Nodes: Prominent mediastinal lymph nodes, similar to prior study. Likely reactive. Lungs/Pleura: Calcified granulomas. No airspace disease or consolidation. No pleural effusions. No pneumothorax. Upper Abdomen: No acute abnormalities demonstrated. Musculoskeletal: No chest wall abnormality. No acute or significant osseous findings. Review of the MIP images confirms the above findings. IMPRESSION: No evidence of significant pulmonary embolus. Cardiac enlargement. Lungs clear. Electronically Signed   By: WLucienne CapersM.D.   On: 08/03/2018 05:44   EKG Interpretation  Date/Time:  Wednesday August 03 2018 02:16:12 EST Ventricular Rate:  126 PR Interval:    QRS Duration: 84 QT Interval:  342 QTC Calculation: 496 R Axis:   102 Text Interpretation:  Atrial fibrillation Abnormal lateral Q waves Anterior infarct, old When compared with ECG of 06/09/2018, Arm lead reveresal has been corrected Confirmed by GDelora Fuel([id number] on 08/03/2018 2:26:21 AM   Pending Labs Unresulted Labs (From admission, onward)   None      Vitals/Pain Today's Vitals   08/03/18 0444 08/03/18 0530 08/03/18 0600 08/03/18 0630  BP:  (!) 177/102 (!) 155/114 (!) 137/94  Pulse:  89 93 88  Resp:  (!) '23 16 16  ' Temp:      SpO2:  99% 97% 98%  PainSc: 6        Isolation Precautions No active isolations  Medications Medications  diltiazem (CARDIZEM) 1 mg/mL load via infusion 10 mg (10  mg Intravenous Bolus from Bag 08/03/18 0358)    And  diltiazem (CARDIZEM) 100 mg in dextrose 5% 126m (1 mg/mL) infusion (5 mg/hr Intravenous Transfusing/Transfer 08/03/18 0647)  0.9 %  sodium chloride infusion ( Intravenous  Transfusing/Transfer 08/03/18 0647)  sodium chloride 0.9 % injection (has no administration in time range)  iopamidol (ISOVUE-370) 76 % injection (has no administration in time range)  ondansetron (ZOFRAN) injection 4 mg (0 mg Intravenous Hold 08/03/18 0434)  furosemide (LASIX) injection 40 mg (has no administration in time range)  morphine 4 MG/ML injection 4 mg (4 mg Intravenous Given 08/03/18 0413)  insulin aspart (novoLOG) injection 10 Units (10 Units Intravenous Given 08/03/18 0531)  iopamidol (ISOVUE-370) 76 % injection 100 mL (100 mLs Intravenous Contrast Given 08/03/18 0501)  ondansetron (ZOFRAN) injection 4 mg (4 mg Intravenous Given 08/03/18 0412)    Mobility walks

## 2018-08-03 NOTE — H&P (Signed)
History and Physical    [name] [id number]:[id number] DOB: 07-09-1966 DOA: 08/03/2018  PCP: [name], NP   Patient coming from: Home    Chief Complaint: Shortness of breath,chest pain  HPI: [name] is a 52 y.o. female with medical history significant of A. fib, chronic diastolic heart failure, hypertension, diabetes, morbid obesity, pulmonary hypertension who presented from home to the emergency department with complaints of shortness of breath and chest pain which started at 9 PM yesterday.  She describes the pain as sharp, left-sided and rated as 10/10 when it is worse.  Patient also stated that she is unable to lie flat because of difficulty breathing. When patient presented, she was found to have A. fib with RVR.  Patient was recently admitted here and was discharged after management for lower GI bleed.  She was advised to continue her Xarelto but she was in an impression  that she needs  to stop it and has not been taking for last 2 weeks. During my evaluation, patient said her chest pain has significantly improved.   Her heart rate was already controlled and was fluctuating in the range of 80 to 90s .She denies any shortness of breath, cough, fever, chills, abdominal pain, dysuria, nausea, vomiting, diarrhea or headache.  ED Course: Found to have A. fib with RVR.  CHF suspected.  Chest x-ray and CT chest did not show any significant pulmonary edema .  No evidence of pulmonary embolism.  Started on Cardizem drip.  Review of Systems: As per HPI otherwise 10 point review of systems negative.    Past Medical History:  Diagnosis Date  . Acute on chronic diastolic congestive heart failure ([place]) 11/02/2013   10/03/2015, 11/13/2015, 08/03/2017  . Benign essential HTN 11/28/2013  . Bipolar disease, chronic ([place])   . Chest pain    a. 2012 Myoview: EF 63%, no isch/infarct;  b. 04/2016 Lexiscan MV: EF 73%, no ischemia/infarct-->Low risk.  . Chronic diastolic CHF (congestive heart  failure) ([place]) 07/23/2011   a. 2015 Echo: EF 55-60%, Gr2 DD;  b. 09/2015 Echo: EF 60-65%, no rwma, mod dil LA, PASP 23mmHg.  . Cor pulmonale (chronic) ([place])   . History of thyrotoxicosis   . HTN (hypertension) 11/28/2013  . Hypertensive heart disease 10/18/2013  . Hypoglycemia   . Insulin dependent type 2 diabetes mellitus, uncontrolled ([place])   . Mediastinal adenopathy   . Morbid obesity due to excess calories ([place]) 02/19/2011  . Morbid obesity with BMI of 50.0-59.9, adult ([place])   . OSA (obstructive sleep apnea) 03/06/2011  . Persistent atrial fibrillation 12/09/2017  . Pulmonary HTN, moderate to severe 11/03/2013  . Sinusitis, chronic 01/02/2015  . SVT (supraventricular tachycardia) ([place]) 12/06/2013  . Uncontrolled type 2 diabetes mellitus with hyperglycemia [facility])     Past Surgical History:  Procedure Laterality Date  . CARDIOVERSION N/A 04/05/2018   Procedure: CARDIOVERSION;  Surgeon: Lelon Perla, MD;  Location: [facility] ENDOSCOPY;  Service: Cardiovascular;  Laterality: N/A;  . COLONOSCOPY WITH PROPOFOL Left 07/16/2018   Procedure: COLONOSCOPY WITH PROPOFOL;  Surgeon: Ronnette Juniper, MD;  Location: [facility] ENDOSCOPY;  Service: Gastroenterology;  Laterality: Left;  . None    . POLYPECTOMY  07/16/2018   Procedure: POLYPECTOMY;  Surgeon: Ronnette Juniper, MD;  Location: [facility] ENDOSCOPY;  Service: Gastroenterology;;     reports that she has never smoked. She has never used smokeless tobacco. She reports that she does not drink alcohol or use drugs.  Allergies  Allergen Reactions  . Acetaminophen Other (See  Comments)    Seizure-like "fits" as a child  . Caffeine     Tense, anxiety, increased urination  . [place] [Dapagliflozin] Other (See Comments)    Hallucinations, drop in blood sugar  . Lisinopril Rash    Rash with lisinopril; but fosinopril is ok per patient    Family History  Problem Relation Age of Onset  . Heart failure Father   . Stroke Father   . Hypertension Mother   . Heart disease  Maternal Grandfather      Prior to Admission medications   Medication Sig Start Date End Date Taking? Authorizing Provider  albuterol (VENTOLIN HFA) 108 (90 Base) MCG/ACT inhaler INHALE 1 TO 2 PUFFS EVERY 6 HOURS AS NEEDED FOR WHEEZING/ SHORTNESS OF BREATH 06/21/18  Yes [name], NP  carvedilol (COREG) 25 MG tablet TAKE 1 TABLET BY MOUTH 2 TIMES DAILY. 04/18/18  Yes Lelon Perla, MD  diltiazem (CARDIZEM CD) 240 MG 24 hr capsule Take 1 capsule (240 mg total) by mouth daily. 06/14/18 09/12/18 Yes Barrett, Evelene Croon, PA-C  ferrous sulfate 325 (65 FE) MG tablet Take 1 tablet (325 mg total) by mouth 2 (two) times daily. 04/15/18  Yes Bhagat, Bhavinkumar, PA  furosemide (LASIX) 40 MG tablet Take 1 tablet (40 mg total) by mouth 2 (two) times daily. 04/16/18  Yes Isaiah Serge, NP  glipiZIDE (GLIPIZIDE XL) 10 MG 24 hr tablet Take 1 tablet (10 mg total) by mouth daily with breakfast. 06/21/18  Yes [name], NP  losartan (COZAAR) 100 MG tablet Take 1 tablet (100 mg total) by mouth daily. 06/21/18 09/19/18 Yes [name], NP  metFORMIN (GLUCOPHAGE) 1000 MG tablet Take 1 tablet by mouth 2 (two) times daily with a meal. 06/21/18  Yes [provider]  oxybutynin (DITROPAN XL) 15 MG 24 hr tablet Take 1 tablet (15 mg total) by mouth at bedtime. 06/21/18  Yes [name], NP  polyethylene glycol (MIRALAX / GLYCOLAX) packet Take 17 g by mouth daily as needed for mild constipation. 07/16/18  Yes Patrecia Pour, Christean Grief, MD  potassium chloride SA (K-DUR,KLOR-CON) 20 MEQ tablet Take 1 tablet (20 mEq total) by mouth 2 (two) times daily. 04/15/18  Yes Bhagat, Bhavinkumar, PA  risperiDONE (RISPERDAL) 2 MG tablet Take 1 tablet (2 mg total) by mouth daily. 05/27/18  Yes [name], NP  ergocalciferol (DRISDOL) 50000 units capsule Take 1 capsule (50,000 Units total) by mouth once a week. 07/21/18   Charlott Rakes, MD  glucose blood test strip Use as instructed. Check blood glucose levels by  fingerstick twice per day 06/21/18   [name], NP  rivaroxaban (XARELTO) 20 MG TABS tablet Take 1 tablet (20 mg total) by mouth daily with supper. Patient not taking: Reported on 07/20/2018 07/17/18 08/16/18  Doreatha Lew, MD    Physical Exam: Vitals:   08/03/18 0600 08/03/18 0630 08/03/18 0717 08/03/18 0800  BP: (!) 155/114 (!) 137/94  (!) 169/107  Pulse: 93 88 91 83  Resp: 16 16 20  (!) 23  Temp:    98.1 F (36.7 C)  TempSrc:    Oral  SpO2: 97% 98% 100% 97%    Constitutional: NAD, calm, comfortable, morbidly obese Vitals:   08/03/18 0600 08/03/18 0630 08/03/18 0717 08/03/18 0800  BP: (!) 155/114 (!) 137/94  (!) 169/107  Pulse: 93 88 91 83  Resp: 16 16 20  (!) 23  Temp:    98.1 F (36.7 C)  TempSrc:    Oral  SpO2: 97% 98% 100% 97%   Eyes: PERRL, lids and conjunctivae normal ENMT: Mucous membranes are moist. Posterior pharynx clear of any exudate or lesions.Normal dentition.  Neck: normal, supple, no masses, no thyromegaly Respiratory: clear to auscultation bilaterally, no wheezing, no crackles. Normal respiratory effort. No accessory muscle use.  Cardiovascular: Afib, no murmurs / rubs / gallops. Trace lower extremity edema. 2+ pedal pulses. No carotid bruits.  Abdomen: no tenderness, no masses palpated. No hepatosplenomegaly. Bowel sounds positive.  Musculoskeletal: no clubbing / cyanosis. No joint deformity upper and lower extremities. Good ROM, no contractures. Normal muscle tone.  Skin: no rashes, lesions, ulcers. No induration Neurologic: CN 2-12 grossly intact. Sensation intact, DTR normal. Strength 5/5 in all 4.  Psychiatric: Normal judgment and insight. Alert and oriented x 3. Normal mood.   Foley Catheter:None  Labs on Admission: I have personally reviewed following labs and imaging studies  CBC: Recent Labs  Lab 08/03/18 0247  WBC 7.0  NEUTROABS 5.9  HGB 10.2*  HCT 34.2*  MCV 91.0  PLT 829   Basic Metabolic Panel: Recent Labs  Lab  08/03/18 0247  NA 137  K 3.9  CL 101  CO2 24  GLUCOSE 416*  BUN 14  CREATININE 0.77  CALCIUM 9.4   GFR: Estimated Creatinine Clearance: 129.4 mL/min (by C-G formula based on SCr of 0.77 mg/dL). Liver Function Tests: No results for input(s): AST, ALT, ALKPHOS, BILITOT, PROT, ALBUMIN in the last 168 hours. No results for input(s): LIPASE, AMYLASE in the last 168 hours. No results for input(s): AMMONIA in the last 168 hours. Coagulation Profile: No results for input(s): INR, PROTIME in the last 168 hours. Cardiac Enzymes: No results for input(s): CKTOTAL, CKMB, CKMBINDEX, TROPONINI in the last 168 hours. BNP (last 3 results) No results for input(s): PROBNP in the last 8760 hours. HbA1C: No results for input(s): HGBA1C in the last 72 hours. CBG: Recent Labs  Lab 08/03/18 0612  GLUCAP 225*   Lipid Profile: No results for input(s): CHOL, HDL, LDLCALC, TRIG, CHOLHDL, LDLDIRECT in the last 72 hours. Thyroid Function Tests: No results for input(s): TSH, T4TOTAL, FREET4, T3FREE, THYROIDAB in the last 72 hours. Anemia Panel: No results for input(s): VITAMINB12, FOLATE, FERRITIN, TIBC, IRON, RETICCTPCT in the last 72 hours. Urine analysis:    Component Value Date/Time   COLORURINE YELLOW 04/13/2018 1214   APPEARANCEUR HAZY (A) 04/13/2018 1214   LABSPEC 1.008 04/13/2018 1214   PHURINE 5.0 04/13/2018 1214   GLUCOSEU NEGATIVE 04/13/2018 1214   HGBUR SMALL (A) 04/13/2018 1214   BILIRUBINUR NEGATIVE 04/13/2018 1214   BILIRUBINUR neg 09/10/2014 1219   KETONESUR NEGATIVE 04/13/2018 1214   PROTEINUR 30 (A) 04/13/2018 1214   UROBILINOGEN 1.0 09/10/2014 1219   UROBILINOGEN 0.2 12/07/2013 0041   NITRITE NEGATIVE 04/13/2018 1214   LEUKOCYTESUR SMALL (A) 04/13/2018 1214    Radiological Exams on Admission: Dg Chest 2 View  Result Date: 08/03/2018 CLINICAL DATA:  Chest pain and shortness of breath beginning 8 hours ago. EXAM: CHEST - 2 VIEW COMPARISON:  04/13/2018 FINDINGS: Shallow  inspiration. Cardiac enlargement. No vascular congestion, edema, or consolidation. No blunting of costophrenic angles. No pneumothorax. Mediastinal contours appear intact. Soft tissue attenuation limits examination. IMPRESSION: Cardiac enlargement. No evidence of active pulmonary disease. Electronically Signed   By: Lucienne Capers M.D.   On: 08/03/2018 03:29   Ct Angio Chest Pe W And/or Wo Contrast  Result Date: 08/03/2018 CLINICAL DATA:  Left chest pain, worse with palpation. EXAM: CT ANGIOGRAPHY CHEST WITH CONTRAST  TECHNIQUE: Multidetector CT imaging of the chest was performed using the standard protocol during bolus administration of intravenous contrast. Multiplanar CT image reconstructions and MIPs were obtained to evaluate the vascular anatomy. CONTRAST:  125mL ISOVUE-370 IOPAMIDOL (ISOVUE-370) INJECTION 76% COMPARISON:  12/23/2017 FINDINGS: Cardiovascular: Good opacification of the central and segmental pulmonary arteries. No focal filling defects. No evidence of significant pulmonary embolus. Cardiac enlargement. Normal caliber thoracic aorta. No pericardial effusions. Mediastinum/Nodes: Prominent mediastinal lymph nodes, similar to prior study. Likely reactive. Lungs/Pleura: Calcified granulomas. No airspace disease or consolidation. No pleural effusions. No pneumothorax. Upper Abdomen: No acute abnormalities demonstrated. Musculoskeletal: No chest wall abnormality. No acute or significant osseous findings. Review of the MIP images confirms the above findings. IMPRESSION: No evidence of significant pulmonary embolus. Cardiac enlargement. Lungs clear. Electronically Signed   By: Lucienne Capers M.D.   On: 08/03/2018 05:44     Assessment/Plan Principal Problem:   Persistent atrial fibrillation Active Problems:   Morbid obesity due to excess calories (HCC)   Chronic diastolic heart failure (HCC)   Pulmonary HTN, moderate to severe   Benign essential HTN   Chest pain   Uncontrolled type 2  diabetes mellitus with hyperglycemia (HCC)   A-fib (HCC)  Persistent A. fib: On carvedilol at home which we will restart.  Patient heart rate is already controlled.  She has been started on Cardizem drip which will taper off.  Continue to monitor on telemetry. She was admitted on October and was managed for hematochezia/bleeding polyp.  Was supposed to be taking Xarelto as per the last discharge summary.  Patient was not taking Xarelto for last 2 weeks.  Will restart Xarelto at 20 mg daily.  CBC stable. She needs to follow-up with her cardiologist Dr. [name] as an outpatient.  Chronic diastolic heart failure: Currently compensated.  Mild elevated BNP.  She is not in acute heart failure at present.  Her lungs are clear on examination.  She was given Lasix 40 mg IV once.  We will restart her Lasix 40 mg twice daily.  Echocardiogram done on July 2019 showed ejection fraction of 55 to 60%, mild LVH.  Hypertension: Noted to be hypertensive.  Will restart her home medication and continue to monitor her blood pressure.  Chest pain: Atypical.  Significantly improved.  EKG did not show any ischemic changes.  Uncontrolled type 2 diabetes: On metformin and glipizide at home.  Will start on sliding scale  insulin here.  Morbid obesity: Counseled on the importance of diet and exercise.  Bipolar disorder: Resume her home medication.  On risperidone.  Severity of Illness: The appropriate patient status for this patient is OBSERVATION.    DVT prophylaxis: Xarelto Code Status: Full Family Communication: None present at the bedside Consults called: None     Shelly Coss MD [facility] Pager [phone]  If 7PM-7AM, please contact night-coverage [email or web address] Password [facility]  08/03/2018, 8:38 AM

## 2018-08-03 NOTE — ED Triage Notes (Signed)
Pt from home with c/o left CP that began when she was eating dinner tonight around 2200. Pt states she has left sided CP that is worse with palpation. Pt has hx of SVT and has been cardioverted. Pt has strong radial pulse. Pt was on xarelto but stopped taking it about 15 days ago d/t GI bleed

## 2018-08-03 NOTE — Progress Notes (Signed)
CRITICAL VALUE ALERT  Critical Value:  Troponin 2.14  Date & Time Notied:    Provider Notified: Dr. Tawanna Solo   Orders Received/Actions taken: MD Paged

## 2018-08-03 NOTE — ED Provider Notes (Signed)
[facility] DEPT Provider Note   CSN: [id number] Arrival date & time: 08/03/18  0152     History   Chief Complaint Chief Complaint  Patient presents with  . Chest Pain    HPI [name] is a 52 y.o. female.  The history is provided by the patient.  She has history of hypertension, diabetes, chronic diastolic heart failure, morbid obesity, pulmonary hypertension and comes in because of difficulty breathing and chest pain which started at 9 PM.  She had recently been admitted to the hospital for GI bleed and has not been taking her rivaroxaban for the last 18 days.  Chest pain is sharp and left-sided and she rates it at 10/10.  It does radiate to the back and is worse with a deep breath.  She states that she is unable to lay flat because of difficulty breathing, but this is a chronic problem.  She has not done anything to treat her symptoms at home.  She denies nausea or diaphoresis.  She states that she has been taking her diltiazem and carvedilol.  Past Medical History:  Diagnosis Date  . Acute on chronic diastolic congestive heart failure ([place]) 11/02/2013   10/03/2015, 11/13/2015, 08/03/2017  . Benign essential HTN 11/28/2013  . Bipolar disease, chronic ([place])   . Chest pain    a. 2012 Myoview: EF 63%, no isch/infarct;  b. 04/2016 Lexiscan MV: EF 73%, no ischemia/infarct-->Low risk.  . Chronic diastolic CHF (congestive heart failure) ([place]) 07/23/2011   a. 2015 Echo: EF 55-60%, Gr2 DD;  b. 09/2015 Echo: EF 60-65%, no rwma, mod dil LA, PASP 23mHg.  . Cor pulmonale (chronic) ([place]   . History of thyrotoxicosis   . HTN (hypertension) 11/28/2013  . Hypertensive heart disease 10/18/2013  . Hypoglycemia   . Insulin dependent type 2 diabetes mellitus, uncontrolled ([place]   . Mediastinal adenopathy   . Morbid obesity due to excess calories ([place] 02/19/2011  . Morbid obesity with BMI of 50.0-59.9, adult ([place]   . OSA (obstructive sleep apnea) 03/06/2011  .  Persistent atrial fibrillation 12/09/2017  . Pulmonary HTN, moderate to severe 11/03/2013  . Sinusitis, chronic 01/02/2015  . SVT (supraventricular tachycardia) ([place] 12/06/2013  . Uncontrolled type 2 diabetes mellitus with hyperglycemia ([facility]     Patient Active Problem List   Diagnosis Date Noted  . Vitamin B12 deficiency 07/20/2018  . Hematochezia 07/14/2018  . Acute posthemorrhagic anemia 07/14/2018  . GIB (gastrointestinal bleeding) 07/14/2018  . Chronic anticoagulation 07/14/2018  . Chronic respiratory failure with hypoxia ([place] 05/06/2018  . Persistent atrial fibrillation   . Uncontrolled type 2 diabetes mellitus with hyperglycemia ([place]   . Hypokalemia   . Cor pulmonale (chronic) ([place]   . Mycobacterium avium complex ([place] 12/13/2015  . Pyrexia   . Dyspnea 11/13/2015  . Mediastinal adenopathy 11/13/2015  . Abnormal CT scan, chest 11/11/2015  . Chest pain 11/11/2015  . Essential hypertension 03/07/2015  . Depression (emotion) 03/07/2015  . Noninfectious gastroenteritis and colitis 01/02/2015  . Sinusitis, chronic 01/02/2015  . Midline low back pain without sciatica 09/10/2014  . Bipolar 1 disorder, mixed, moderate ([place] 07/02/2014  . Stress incontinence 07/02/2014  . Mania ([place] 12/10/2013  . Speech abnormality 12/08/2013  . SVT (supraventricular tachycardia) ([place] 12/06/2013  . Benign essential HTN 11/28/2013  . HTN (hypertension) 11/28/2013  . Pulmonary HTN, moderate to severe 11/03/2013  . Acute on chronic diastolic congestive heart failure ([place] 11/02/2013  . Hypertensive heart disease 10/18/2013  . Chronic diastolic heart  failure ([place]) 07/23/2011  . OSA (obstructive sleep apnea)- non compliant with C-pap 03/06/2011  . Morbid obesity due to excess calories ([place]) 02/19/2011  . Bipolar disorder     Past Surgical History:  Procedure Laterality Date  . CARDIOVERSION N/A 04/05/2018   Procedure: CARDIOVERSION;  Surgeon: Lelon Perla, MD;  Location: [facility] ENDOSCOPY;   Service: Cardiovascular;  Laterality: N/A;  . COLONOSCOPY WITH PROPOFOL Left 07/16/2018   Procedure: COLONOSCOPY WITH PROPOFOL;  Surgeon: Ronnette Juniper, MD;  Location: [facility] ENDOSCOPY;  Service: Gastroenterology;  Laterality: Left;  . None    . POLYPECTOMY  07/16/2018   Procedure: POLYPECTOMY;  Surgeon: Ronnette Juniper, MD;  Location: [facility] ENDOS[facility];  Service: Gastroenterology;;     OB History   None      Home Medications    Prior to Admission medications   Medication Sig Start Date End Date Taking? Authorizing Provider  albuterol (VENTOLIN HFA) 108 (90 Base) MCG/ACT inhaler INHALE 1 TO 2 PUFFS EVERY 6 HOURS AS NEEDED FOR WHEEZING/ SHORTNESS OF BREATH Patient not taking: Reported on 07/20/2018 06/21/18   [name], NP  carvedilol (COREG) 25 MG tablet TAKE 1 TABLET BY MOUTH 2 TIMES DAILY. 04/18/18   Lelon Perla, MD  diltiazem (CARDIZEM CD) 240 MG 24 hr capsule Take 1 capsule (240 mg total) by mouth daily. Patient not taking: Reported on 07/20/2018 06/14/18 09/12/18  Barrett, Evelene Croon, PA-C  ergocalciferol (DRISDOL) 50000 units capsule Take 1 capsule (50,000 Units total) by mouth once a week. 07/21/18   Charlott Rakes, MD  ferrous sulfate 325 (65 FE) MG tablet Take 1 tablet (325 mg total) by mouth 2 (two) times daily. 04/15/18   Bhagat, Crista Luria, PA  furosemide (LASIX) 40 MG tablet Take 1 tablet (40 mg total) by mouth 2 (two) times daily. 04/16/18   Isaiah Serge, NP  glipiZIDE (GLIPIZIDE XL) 10 MG 24 hr tablet Take 1 tablet (10 mg total) by mouth daily with breakfast. 06/21/18   [name], NP  glucose blood test strip Use as instructed. Check blood glucose levels by fingerstick twice per day 06/21/18   [name], NP  losartan (COZAAR) 100 MG tablet Take 1 tablet (100 mg total) by mouth daily. 06/21/18 09/19/18  [name], NP  metFORMIN (GLUCOPHAGE) 1000 MG tablet Take 1 tablet by mouth 2 (two) times daily with a meal. 06/21/18   [provider]  oxybutynin  (DITROPAN XL) 15 MG 24 hr tablet Take 1 tablet (15 mg total) by mouth at bedtime. 06/21/18   [name], NP  polyethylene glycol (MIRALAX / GLYCOLAX) packet Take 17 g by mouth daily as needed for mild constipation. 07/16/18   Doreatha Lew, MD  potassium chloride SA (K-DUR,KLOR-CON) 20 MEQ tablet Take 1 tablet (20 mEq total) by mouth 2 (two) times daily. 04/15/18   Bhagat, Crista Luria, PA  risperiDONE (RISPERDAL) 2 MG tablet Take 1 tablet (2 mg total) by mouth daily. Patient not taking: Reported on 07/20/2018 05/27/18   [name], NP  rivaroxaban (XARELTO) 20 MG TABS tablet Take 1 tablet (20 mg total) by mouth daily with supper. Patient not taking: Reported on 07/20/2018 07/17/18 08/16/18  Doreatha Lew, MD    Family History Family History  Problem Relation Age of Onset  . Heart failure Father   . Stroke Father   . Hypertension Mother   . Heart disease Maternal Grandfather     Social History Social History   Tobacco Use  . Smoking  status: Never Smoker  . Smokeless tobacco: Never Used  Substance Use Topics  . Alcohol use: No  . Drug use: No     Allergies   Acetaminophen; Caffeine; Farxiga [dapagliflozin]; and Lisinopril   Review of Systems Review of Systems  All other systems reviewed and are negative.    Physical Exam Updated Vital Signs Pulse 100   Temp 97.9 F (36.6 C)   SpO2 100%   Physical Exam  Nursing note and vitals reviewed.  Morbidly obese 52 year old female, resting comfortably and in no acute distress. Vital signs are significant for elevated blood pressure. Oxygen saturation is 100%, which is normal. Head is normocephalic and atraumatic. PERRLA, EOMI. Oropharynx is clear. Neck is nontender and supple without adenopathy or JVD. Back is nontender and there is no CVA tenderness. Lungs are clear without rales, wheezes, or rhonchi. Chest is markedly tender on the left side.  There is no crepitus.  There is no point tenderness. Heart  has regular rate and rhythm without murmur. Abdomen is soft, flat, nontender without masses or hepatosplenomegaly and peristalsis is normoactive. Extremities have 2+ edema, full range of motion is present. Skin is warm and dry without rash. Neurologic: Mental status is normal, cranial nerves are intact, there are no motor or sensory deficits.  ED Treatments / Results  Labs (all labs ordered are listed, but only abnormal results are displayed) Labs Reviewed  BASIC METABOLIC PANEL - Abnormal; Notable for the following components:      Result Value   Glucose, Bld 416 (*)    All other components within normal limits  CBC - Abnormal; Notable for the following components:   RBC 3.76 (*)    Hemoglobin 10.2 (*)    HCT 34.2 (*)    MCHC 29.8 (*)    RDW 15.9 (*)    All other components within normal limits  BRAIN NATRIURETIC PEPTIDE - Abnormal; Notable for the following components:   B Natriuretic Peptide 493.9 (*)    All other components within normal limits  DIFFERENTIAL - Abnormal; Notable for the following components:   Lymphs Abs 0.6 (*)    All other components within normal limits  CBG MONITORING, ED - Abnormal; Notable for the following components:   Glucose-Capillary 225 (*)    All other components within normal limits  I-STAT BETA HCG BLOOD, ED ([facility], [facility], [facility] ONLY)  I-STAT TROPONIN, ED    EKG EKG Interpretation  Date/Time:  Wednesday August 03 2018 02:16:12 EST Ventricular Rate:  126 PR Interval:    QRS Duration: 84 QT Interval:  342 QTC Calculation: 496 R Axis:   102 Text Interpretation:  Atrial fibrillation Abnormal lateral Q waves Anterior infarct, old When compared with ECG of 06/09/2018, Arm lead reveresal has been corrected Confirmed by Delora Fuel ([id number]) on 08/03/2018 2:26:21 AM   Radiology Dg Chest 2 View  Result Date: 08/03/2018 CLINICAL DATA:  Chest pain and shortness of breath beginning 8 hours ago. EXAM: CHEST - 2 VIEW COMPARISON:  04/13/2018 FINDINGS:  Shallow inspiration. Cardiac enlargement. No vascular congestion, edema, or consolidation. No blunting of costophrenic angles. No pneumothorax. Mediastinal contours appear intact. Soft tissue attenuation limits examination. IMPRESSION: Cardiac enlargement. No evidence of active pulmonary disease. Electronically Signed   By: Lucienne Capers M.D.   On: 08/03/2018 03:29   Ct Angio Chest Pe W And/or Wo Contrast  Result Date: 08/03/2018 CLINICAL DATA:  Left chest pain, worse with palpation. EXAM: CT ANGIOGRAPHY CHEST WITH CONTRAST TECHNIQUE: Multidetector CT imaging  of the chest was performed using the standard protocol during bolus administration of intravenous contrast. Multiplanar CT image reconstructions and MIPs were obtained to evaluate the vascular anatomy. CONTRAST:  124m ISOVUE-370 IOPAMIDOL (ISOVUE-370) INJECTION 76% COMPARISON:  12/23/2017 FINDINGS: Cardiovascular: Good opacification of the central and segmental pulmonary arteries. No focal filling defects. No evidence of significant pulmonary embolus. Cardiac enlargement. Normal caliber thoracic aorta. No pericardial effusions. Mediastinum/Nodes: Prominent mediastinal lymph nodes, similar to prior study. Likely reactive. Lungs/Pleura: Calcified granulomas. No airspace disease or consolidation. No pleural effusions. No pneumothorax. Upper Abdomen: No acute abnormalities demonstrated. Musculoskeletal: No chest wall abnormality. No acute or significant osseous findings. Review of the MIP images confirms the above findings. IMPRESSION: No evidence of significant pulmonary embolus. Cardiac enlargement. Lungs clear. Electronically Signed   By: WLucienne CapersM.D.   On: 08/03/2018 05:44    Procedures Procedures  CRITICAL CARE Performed by: DDelora FuelTotal critical care time: 55 minutes Critical care time was exclusive of separately billable procedures and treating other patients. Critical care was necessary to treat or prevent imminent or  life-threatening deterioration. Critical care was time spent personally by me on the following activities: development of treatment plan with patient and/or surrogate as well as nursing, discussions with consultants, evaluation of patient's response to treatment, examination of patient, obtaining history from patient or surrogate, ordering and performing treatments and interventions, ordering and review of laboratory studies, ordering and review of radiographic studies, pulse oximetry and re-evaluation of patient's condition.  Medications Ordered in ED Medications - No data to display   Initial Impression / Assessment and Plan / ED Course  I have reviewed the triage vital signs and the nursing notes.  Pertinent labs & imaging results that were available during my care of the patient were reviewed by me and considered in my medical decision making (see chart for details).  Chest pain and dyspnea.  Dyspnea is likely exacerbation of known diastolic heart failure.  She is noted to be in atrial fibrillation with rapid ventricular response.  She has a known history of persistent atrial fibrillation.  She is given diltiazem to try to slow her heart rate and lower her blood pressure.  Chest x-ray shows cardiomegaly but no evidence of pulmonary edema.  With pleuritic chest pain, and patient off of anticoagulants for extended period, will send for CT angiogram to rule out pulmonary embolism.  Old records are reviewed confirming recent hospitalization for GI bleed.  However, she was supposed to have restarted her rivaroxaban at discharge.  Today, hemoglobin is stable.  She has significant hyperglycemia with glucose 416, but normal CO2 and normal anion gap.  Troponin is normal.  She is given a dose of insulin.  Glucose has come down with insulin.  Heart rate has come down with diltiazem.  Unfortunately, she still is feeling dyspneic.  BNP is moderately elevated, and higher than what appears to be her baseline.  CT  angiogram showed no evidence of pulmonary embolism or pulmonary edema or pneumonia.  She is given a dose of furosemide.  Case is discussed with Dr.Kakrakandy of [facility], who agrees to admit the patient.  Final Clinical Impressions(s) / ED Diagnoses   Final diagnoses:  Acute on chronic diastolic heart failure ([place]  Persistent atrial fibrillation with rapid ventricular response    ED Discharge Orders    None       GDelora Fuel MD [id number] [phone]

## 2018-08-04 ENCOUNTER — Encounter (HOSPITAL_COMMUNITY)
Admission: EM | Disposition: A | Discharge status: Home / Self Care | Payer: Self-pay | Source: Home / Self Care | Attending: Family Medicine

## 2018-08-04 DIAGNOSIS — N179 Acute kidney failure, unspecified: Secondary | ICD-10-CM | POA: Diagnosis not present

## 2018-08-04 DIAGNOSIS — I272 Pulmonary hypertension, unspecified: Secondary | ICD-10-CM | POA: Diagnosis not present

## 2018-08-04 DIAGNOSIS — Z7984 Long term (current) use of oral hypoglycemic drugs: Secondary | ICD-10-CM | POA: Diagnosis not present

## 2018-08-04 DIAGNOSIS — I472 Ventricular tachycardia: Secondary | ICD-10-CM

## 2018-08-04 DIAGNOSIS — I4891 Unspecified atrial fibrillation: Secondary | ICD-10-CM | POA: Diagnosis not present

## 2018-08-04 DIAGNOSIS — T508X5A Adverse effect of diagnostic agents, initial encounter: Secondary | ICD-10-CM | POA: Diagnosis present

## 2018-08-04 DIAGNOSIS — I4901 Ventricular fibrillation: Secondary | ICD-10-CM | POA: Diagnosis not present

## 2018-08-04 DIAGNOSIS — Z79899 Other long term (current) drug therapy: Secondary | ICD-10-CM | POA: Diagnosis not present

## 2018-08-04 DIAGNOSIS — E1165 Type 2 diabetes mellitus with hyperglycemia: Secondary | ICD-10-CM

## 2018-08-04 DIAGNOSIS — R002 Palpitations: Secondary | ICD-10-CM | POA: Diagnosis not present

## 2018-08-04 DIAGNOSIS — I5032 Chronic diastolic (congestive) heart failure: Secondary | ICD-10-CM | POA: Diagnosis not present

## 2018-08-04 DIAGNOSIS — D62 Acute posthemorrhagic anemia: Secondary | ICD-10-CM | POA: Diagnosis present

## 2018-08-04 DIAGNOSIS — R079 Chest pain, unspecified: Secondary | ICD-10-CM | POA: Diagnosis not present

## 2018-08-04 DIAGNOSIS — Z8249 Family history of ischemic heart disease and other diseases of the circulatory system: Secondary | ICD-10-CM | POA: Diagnosis not present

## 2018-08-04 DIAGNOSIS — I214 Non-ST elevation (NSTEMI) myocardial infarction: Secondary | ICD-10-CM | POA: Diagnosis not present

## 2018-08-04 DIAGNOSIS — I480 Paroxysmal atrial fibrillation: Secondary | ICD-10-CM | POA: Diagnosis not present

## 2018-08-04 DIAGNOSIS — I110 Hypertensive heart disease with heart failure: Secondary | ICD-10-CM | POA: Diagnosis not present

## 2018-08-04 DIAGNOSIS — I4819 Other persistent atrial fibrillation: Principal | ICD-10-CM

## 2018-08-04 DIAGNOSIS — I2729 Other secondary pulmonary hypertension: Secondary | ICD-10-CM | POA: Diagnosis present

## 2018-08-04 DIAGNOSIS — I5033 Acute on chronic diastolic (congestive) heart failure: Secondary | ICD-10-CM | POA: Diagnosis not present

## 2018-08-04 DIAGNOSIS — F319 Bipolar disorder, unspecified: Secondary | ICD-10-CM | POA: Diagnosis present

## 2018-08-04 DIAGNOSIS — N141 Nephropathy induced by other drugs, medicaments and biological substances: Secondary | ICD-10-CM | POA: Diagnosis present

## 2018-08-04 DIAGNOSIS — Z7901 Long term (current) use of anticoagulants: Secondary | ICD-10-CM | POA: Diagnosis not present

## 2018-08-04 DIAGNOSIS — I10 Essential (primary) hypertension: Secondary | ICD-10-CM | POA: Diagnosis not present

## 2018-08-04 DIAGNOSIS — R0602 Shortness of breath: Secondary | ICD-10-CM | POA: Diagnosis not present

## 2018-08-04 DIAGNOSIS — R0789 Other chest pain: Secondary | ICD-10-CM | POA: Diagnosis not present

## 2018-08-04 DIAGNOSIS — I248 Other forms of acute ischemic heart disease: Secondary | ICD-10-CM | POA: Diagnosis present

## 2018-08-04 DIAGNOSIS — N170 Acute kidney failure with tubular necrosis: Secondary | ICD-10-CM | POA: Diagnosis present

## 2018-08-04 DIAGNOSIS — Z6841 Body Mass Index (BMI) 40.0 and over, adult: Secondary | ICD-10-CM | POA: Diagnosis not present

## 2018-08-04 HISTORY — PX: LEFT HEART CATH AND CORONARY ANGIOGRAPHY: CATH118249

## 2018-08-04 LAB — GLUCOSE, CAPILLARY
GLUCOSE-CAPILLARY: 201 mg/dL — AB (ref 70–99)
Glucose-Capillary: 212 mg/dL — ABNORMAL HIGH (ref 70–99)
Glucose-Capillary: 248 mg/dL — ABNORMAL HIGH (ref 70–99)
Glucose-Capillary: 222 mg/dL — ABNORMAL HIGH (ref 70–99)

## 2018-08-04 LAB — CBC
HEMATOCRIT: 32 % — AB (ref 36.0–46.0)
Hemoglobin: 9.4 g/dL — ABNORMAL LOW (ref 12.0–15.0)
MCH: 27.1 pg (ref 26.0–34.0)
MCHC: 29.4 g/dL — AB (ref 30.0–36.0)
MCV: 92.2 fL (ref 80.0–100.0)
Platelets: 248 10*3/uL (ref 150–400)
RBC: 3.47 MIL/uL — ABNORMAL LOW (ref 3.87–5.11)
RDW: 15.7 % — AB (ref 11.5–15.5)
WBC: 7.9 10*3/uL (ref 4.0–10.5)
nRBC: 0 % (ref 0.0–0.2)

## 2018-08-04 LAB — BASIC METABOLIC PANEL
Anion gap: 10 (ref 5–15)
BUN: 14 mg/dL (ref 6–20)
CALCIUM: 9.1 mg/dL (ref 8.9–10.3)
CO2: 23 mmol/L (ref 22–32)
CREATININE: 0.72 mg/dL (ref 0.44–1.00)
Chloride: 101 mmol/L (ref 98–111)
GFR calc Af Amer: >60 mL/min (ref >60)
Glucose, Bld: 200 mg/dL — ABNORMAL HIGH (ref 70–99)
Potassium: 3.9 mmol/L (ref 3.5–5.1)
SODIUM: 134 mmol/L — AB (ref 135–145)

## 2018-08-04 LAB — HEPARIN LEVEL (UNFRACTIONATED): HEPARIN UNFRACTIONATED: 0.43 [IU]/mL (ref 0.30–0.70)

## 2018-08-04 LAB — TROPONIN I: Troponin I: 2.7 ng/mL (ref <0.03)

## 2018-08-04 SURGERY — LEFT HEART CATH AND CORONARY ANGIOGRAPHY
Anesthesia: LOCAL

## 2018-08-04 MED ORDER — FENTANYL CITRATE (PF) 100 MCG/2ML IJ SOLN
INTRAMUSCULAR | Status: DC | PRN
  Administered 2018-08-04: 25 ug via INTRAVENOUS

## 2018-08-04 MED ORDER — VERAPAMIL HCL 2.5 MG/ML IV SOLN
INTRAVENOUS | Status: DC | PRN
  Administered 2018-08-04: 10 mL via INTRA_ARTERIAL

## 2018-08-04 MED ORDER — HEPARIN (PORCINE) IN NACL 1000-0.9 UT/500ML-% IV SOLN
INTRAVENOUS | Status: DC | PRN
  Administered 2018-08-04 (×2): 500 mL

## 2018-08-04 MED ORDER — HEPARIN SODIUM (PORCINE) 1000 UNIT/ML IJ SOLN
INTRAMUSCULAR | Status: DC | PRN
  Administered 2018-08-04: 6000 [IU] via INTRAVENOUS

## 2018-08-04 MED ORDER — SODIUM CHLORIDE 0.9 % IV SOLN: 250.0000 mL | INTRAVENOUS | Status: DC | PRN

## 2018-08-04 MED ORDER — ONDANSETRON HCL 4 MG/2ML IJ SOLN: 4.0000 mg | Freq: Four times a day (QID) | INTRAMUSCULAR | Status: DC | PRN

## 2018-08-04 MED ORDER — HYDRALAZINE HCL 25 MG PO TABS
25.0000 mg | ORAL_TABLET | Freq: Three times a day (TID) | ORAL | Status: DC
  Administered 2018-08-04 (×2): 25 mg via ORAL
  Filled 2018-08-04 (×3): qty 1

## 2018-08-04 MED ORDER — SODIUM CHLORIDE 0.9 % IV SOLN: INTRAVENOUS | Status: AC

## 2018-08-04 MED ORDER — RIVAROXABAN 20 MG PO TABS
20.0000 mg | ORAL_TABLET | Freq: Every day | ORAL | Status: DC
  Filled 2018-08-04: qty 1

## 2018-08-04 MED ORDER — SODIUM CHLORIDE 0.9% FLUSH: 3.0000 mL | INTRAVENOUS | Status: DC | PRN

## 2018-08-04 MED ORDER — ACETAMINOPHEN 325 MG PO TABS: 650.0000 mg | ORAL_TABLET | ORAL | Status: DC | PRN

## 2018-08-04 MED ORDER — SODIUM CHLORIDE 0.9% FLUSH
3.0000 mL | Freq: Two times a day (BID) | INTRAVENOUS | Status: DC
  Administered 2018-08-05 – 2018-08-09 (×8): 3 mL via INTRAVENOUS

## 2018-08-04 MED ORDER — IOHEXOL 350 MG/ML SOLN
INTRAVENOUS | Status: DC | PRN
  Administered 2018-08-04: 90 mL via INTRACARDIAC

## 2018-08-04 MED ORDER — LIDOCAINE HCL (PF) 1 % IJ SOLN
INTRAMUSCULAR | Status: DC | PRN
  Administered 2018-08-04: 2 mL via INTRADERMAL

## 2018-08-04 MED ORDER — FENTANYL CITRATE (PF) 100 MCG/2ML IJ SOLN
INTRAMUSCULAR | Status: AC
  Filled 2018-08-04: qty 2

## 2018-08-04 MED ORDER — RIVAROXABAN 20 MG PO TABS
20.0000 mg | ORAL_TABLET | Freq: Every day | ORAL | Status: DC
  Administered 2018-08-04 – 2018-08-08 (×5): 20 mg via ORAL
  Filled 2018-08-04 (×6): qty 1

## 2018-08-04 MED ORDER — MIDAZOLAM HCL 2 MG/2ML IJ SOLN
INTRAMUSCULAR | Status: AC
  Filled 2018-08-04: qty 2

## 2018-08-04 MED ORDER — MIDAZOLAM HCL 2 MG/2ML IJ SOLN
INTRAMUSCULAR | Status: DC | PRN
  Administered 2018-08-04: 0.5 mg via INTRAVENOUS

## 2018-08-04 MED ORDER — HEPARIN (PORCINE) IN NACL 1000-0.9 UT/500ML-% IV SOLN
INTRAVENOUS | Status: AC
  Filled 2018-08-04: qty 1000

## 2018-08-04 MED ORDER — LIDOCAINE HCL (PF) 1 % IJ SOLN
INTRAMUSCULAR | Status: AC
  Filled 2018-08-04: qty 30

## 2018-08-04 SURGICAL SUPPLY — 10 items
CATH 5FR JL3.5 JR4 ANG PIG MP (CATHETERS) ×1 IMPLANT
DEVICE RAD COMP TR BAND LRG (VASCULAR PRODUCTS) ×1 IMPLANT
ELECT DEFIB PAD ADLT CADENCE (PAD) ×1 IMPLANT
GLIDESHEATH SLEND A-KIT 6F 22G (SHEATH) ×1 IMPLANT
GUIDEWIRE INQWIRE 1.5J.035X260 (WIRE) IMPLANT
INQWIRE 1.5J .035X260CM (WIRE) ×2
KIT HEART LEFT (KITS) ×2 IMPLANT
PACK CARDIAC CATHETERIZATION (CUSTOM PROCEDURE TRAY) ×2 IMPLANT
TRANSDUCER W/STOPCOCK (MISCELLANEOUS) ×2 IMPLANT
TUBING CIL FLEX 10 FLL-RA (TUBING) ×2 IMPLANT

## 2018-08-04 NOTE — H&P (View-Only) (Signed)
Progress Note  Patient Name: [name] Date of Encounter: 08/04/2018  Primary Cardiologist: [name], MD  EP: Af[facility] Roderic Palau, NP)  Subjective   Had CP overnight but resolved with morphine and up titration of nitro. Currently CP free. Awaiting cath later today.   Inpatient Medications    Scheduled Meds: . aspirin EC  81 mg Oral Daily  . atorvastatin  40 mg Oral q1800  . carvedilol  25 mg Oral BID  . diltiazem  240 mg Oral Daily  . ferrous sulfate  325 mg Oral BID  . furosemide  40 mg Intravenous Once  . hydrALAZINE  25 mg Oral BID  . insulin aspart  0-20 Units Subcutaneous TID WC  . insulin aspart  0-5 Units Subcutaneous QHS  . labetalol  10 mg Intravenous Once  . losartan  100 mg Oral Daily  . ondansetron (ZOFRAN) IV  4 mg Intravenous Once  . oxybutynin  15 mg Oral QHS  . risperiDONE  2 mg Oral Daily  . sodium chloride flush  3 mL Intravenous Q12H   Continuous Infusions: . sodium chloride 10 mL/hr at 08/04/18 0300  . sodium chloride Stopped (08/04/18 0534)  . sodium chloride 50 mL/hr at 08/04/18 0534  . diltiazem (CARDIZEM) infusion Stopped (08/03/18 1001)  . heparin 1,600 Units/hr (08/04/18 0533)  . nitroGLYCERIN 60 mcg/min (08/04/18 0300)   PRN Meds: sodium chloride, sodium chloride, labetalol, morphine injection, nitroGLYCERIN, ondansetron (ZOFRAN) IV, polyethylene glycol, sodium chloride flush   Vital Signs    Vitals:   08/04/18 0600 08/04/18 0615 08/04/18 0630 08/04/18 0645  BP: (!) 151/88 (!) 157/102 (!) 157/108 (!) 158/82  Pulse: (!) 110 77 (!) 101 100  Resp:   18 13  Temp:      TempSrc:      SpO2: 96% 94% 100% 96%  Weight:        Intake/Output Summary (Last 24 hours) at 08/04/2018 0740 Last data filed at 08/04/2018 0300 Gross per 24 hour  Intake 784.17 ml  Output 400 ml  Net 384.17 ml   Filed Weights   08/03/18 2328  Weight: (!) 153 kg    Telemetry    afib w/ CVR and 5 beat run of NSVT - Personally Reviewed  ECG    Afib w/ CVR 84 bpm - Personally Reviewed  Physical Exam   GEN: No acute distress.   Neck: No JVD Cardiac:  irregularly irregular rhythm, regular rate, no murmurs, rubs, or gallops.  Respiratory: Clear to auscultation bilaterally. GI: Soft, nontender, non-distended  MS: No edema; No deformity. Neuro:  Nonfocal  Psych: Normal affect   Labs    Chemistry Recent Labs  Lab 08/03/18 0247 08/04/18 0400  NA 137 134*  K 3.9 3.9  CL 101 101  CO2 24 23  GLUCOSE 416* 200*  BUN 14 14  CREATININE 0.77 0.72  CALCIUM 9.4 9.1  GFRNONAA >60 >60  GFRAA >60 >60  ANIONGAP 12 10     Hematology Recent Labs  Lab 08/03/18 0247 08/04/18 0400  WBC 7.0 7.9  RBC 3.76* 3.47*  HGB 10.2* 9.4*  HCT 34.2* 32.0*  MCV 91.0 92.2  MCH 27.1 27.1  MCHC 29.8* 29.4*  RDW 15.9* 15.7*  PLT 288 248    Cardiac Enzymes Recent Labs  Lab 08/03/18 1308 08/03/18 1549 08/03/18 2039 08/04/18 0400  TROPONINI 1.33* 2.14* 3.26* 2.70*    Recent Labs  Lab 08/03/18 0304  TROPIPOC 0.02     BNP Recent Labs  Lab  08/03/18 0304  BNP 493.9*     DDimer No results for input(s): DDIMER in the last 168 hours.   Radiology    Dg Chest 2 View  Result Date: 08/03/2018 CLINICAL DATA:  Chest pain and shortness of breath beginning 8 hours ago. EXAM: CHEST - 2 VIEW COMPARISON:  04/13/2018 FINDINGS: Shallow inspiration. Cardiac enlargement. No vascular congestion, edema, or consolidation. No blunting of costophrenic angles. No pneumothorax. Mediastinal contours appear intact. Soft tissue attenuation limits examination. IMPRESSION: Cardiac enlargement. No evidence of active pulmonary disease. Electronically Signed   By: Lucienne Capers M.D.   On: 08/03/2018 03:29   Ct Angio Chest Pe W And/or Wo Contrast  Result Date: 08/03/2018 CLINICAL DATA:  Left chest pain, worse with palpation. EXAM: CT ANGIOGRAPHY CHEST WITH CONTRAST TECHNIQUE: Multidetector CT imaging of the chest was performed using the standard protocol  during bolus administration of intravenous contrast. Multiplanar CT image reconstructions and MIPs were obtained to evaluate the vascular anatomy. CONTRAST:  186mL ISOVUE-370 IOPAMIDOL (ISOVUE-370) INJECTION 76% COMPARISON:  12/23/2017 FINDINGS: Cardiovascular: Good opacification of the central and segmental pulmonary arteries. No focal filling defects. No evidence of significant pulmonary embolus. Cardiac enlargement. Normal caliber thoracic aorta. No pericardial effusions. Mediastinum/Nodes: Prominent mediastinal lymph nodes, similar to prior study. Likely reactive. Lungs/Pleura: Calcified granulomas. No airspace disease or consolidation. No pleural effusions. No pneumothorax. Upper Abdomen: No acute abnormalities demonstrated. Musculoskeletal: No chest wall abnormality. No acute or significant osseous findings. Review of the MIP images confirms the above findings. IMPRESSION: No evidence of significant pulmonary embolus. Cardiac enlargement. Lungs clear. Electronically Signed   By: Lucienne Capers M.D.   On: 08/03/2018 05:44    Cardiac Studies   2D Echo 08/03/18 Study Conclusions  - Left ventricle: The cavity size was normal. Wall thickness was   increased in a pattern of moderate to severe LVH. Systolic   function was normal. The estimated ejection fraction was in the   range of 60% to 65%. Wall motion was normal; there were no   regional wall motion abnormalities. LV diastolic function not   assessed on this study. - Left atrium: The atrium was moderately dilated. - Right atrium: The atrium was mildly dilated. - Pulmonary arteries: PA peak pressure: 48 mm Hg (S).   Patient Profile     [name] is a 52 y.o. female with a hx of D-CHF,persistentAfib w/ failed DCCV 02/2018 & 04/05/2018, HTN,obesity,MV 2017 w/ nl perfusionand EF 73%, SVT, chronic intermittent chest pain,CHA2DS2-VASc =3 (female, CHF, HTN) on Xarelto,OSAnot on CPAP,mediastinal adenopathy, basilar groundglass  infiltrates, and prior Mycobacterium avium infection who is being seen today for the evaluation of chest pain and elevated trop at the request of Dr Tawanna Solo.  Assessment & Plan    1. Chest Pain/NSTEMI: currently CP free. Troponin peaked at 3.26. Echo shows normal LVEF and no WMA. Plan is for [facility] today +/-PCI. Continue IV heparin, ASA, statin and BB.   2. Persistent Atrial Fibrillation: Ventricular rate is controlled. She has failed DCCV x 2. Followed in afib clinic. Continue rate control w/ Coreg and Cardizem. CHA2DS2 VASc score =4. Continue IV heparin for now. Resume Xarelto once all invasive procedures are completed.   3. HTN: elevated this morning at 158/82. Scheduled to get AM meds, Coreg, Cardizem, labetalol and losartan. Lasix on hold for cath. Monitor.   4. Chronic Diastolic CHF: appears euvolemic on exam. Lasix is on hold for cath today. Resume post cath. Echo shows normal LVEF, 60-65%.   5. NSVT: 5 beat  run noted on tele. EF normal. K normal at 3.9. Mg was low yesterday at 1.6 but Mg supplementation was given. Plan for LHC today to exclude coronary ischemia. Continue BB therapy w/ Coreg. Will continue to monitor on tele.   For questions or updates, please contact [place] Please consult [email or web address] for contact info under      Signed, Lyda Jester, PA-C  08/04/2018, 7:40 AM    As above, patient seen and examined.  Patient had chest pain overnight but now resolved.  Her troponins are mildly elevated.  Echocardiogram shows normal LV function.  Plan to proceed with cardiac catheterization today.  Continue aspirin, heparin, carvedilol and statin.  Continue carvedilol and Cardizem for rate control of atrial fibrillation.  Resume Xarelto after all procedures complete.  Lasix is on hold for cardiac catheterization.  Resume following procedure. BP improved this AM; follow and advance meds as needed.  [name], MD

## 2018-08-04 NOTE — Progress Notes (Signed)
[name] needs frequent reminders not to use her right wrist while in recovery from cath lab procedure. Her site is level 0, with no bleeding or hematoma noted at this time. Pharmacy has been notified for her morning home meds that were held pre procedure. She has been given a meal and a drink per her request. She verbalizes back discomfort, for which she was given pillows to help alleviate. [name] has been reminded to use the call bell when she needs help repositioning.

## 2018-08-04 NOTE — Progress Notes (Signed)
Received pt from [facility] via [facility] alert and oriented, skin warm and dry with some SOB.  IVF infusing, consent signed, and pt on monitor.  No distress noted at this time.  Family at bedside.

## 2018-08-04 NOTE — Progress Notes (Signed)
Morning medications have arrived from pharmacy and will be administered to [name] as ordered. She is reminded again to keep her wrist elevated and not to use it to pull herself up in the bed. [facility] has been notified for transfer back to [facility].

## 2018-08-04 NOTE — Progress Notes (Signed)
Pt OOB to chair, IV from lt hand accidentally  dislodged and pt removed it.  Mod amt of bleeding  From site.  Vanessa Henri PA in and asked about when to stop heparin for cardiac cath.  She called the cath lab and was told to go ahead and stop it since [facility] was on their way.   Pt assisted back to bed and IV restarted.  No c/o chest pain, however has dyspnea on exertion.

## 2018-08-04 NOTE — Progress Notes (Signed)
Transferred to rm [id number], condition stable.

## 2018-08-04 NOTE — Progress Notes (Signed)
Upon assessment and order chart check, pt. Not on nitroglycerine IV drip, orders were present in chart. Pt. Underwent right cardiac catheterization today, currently not c/o chest pain. BP stable at 111/69 and HR is 72. On call NP Bodenheimer paged and made aware for clarification of orders. NP stated that pt is fine without nitroglycerin drip. Will continue to monitor pt. Closely.

## 2018-08-04 NOTE — Interval H&P Note (Signed)
Cath Lab Visit (complete for each Cath Lab visit)  Clinical Evaluation Leading to the Procedure:   ACS: Yes.    Non-ACS:    Anginal Classification: CCS III  Anti-ischemic medical therapy: Minimal Therapy (1 class of medications)  Non-Invasive Test Results: No non-invasive testing performed  Prior CABG: No previous CABG      History and Physical Interval Note:  08/04/2018 10:50 AM  [name]  has presented today for surgery, with the diagnosis of ns  The various methods of treatment have been discussed with the patient and family. After consideration of risks, benefits and other options for treatment, the patient has consented to  Procedure(s): LEFT HEART CATH AND CORONARY ANGIOGRAPHY (N/A) as a surgical intervention .  The patient's history has been reviewed, patient examined, no change in status, stable for surgery.  I have reviewed the patient's chart and labs.  Questions were answered to the patient's satisfaction.     Belva Crome III

## 2018-08-04 NOTE — Progress Notes (Signed)
Pt to cath lab for procedure via stretcher.  Dr Tamala Julian in to see pt.

## 2018-08-04 NOTE — Progress Notes (Signed)
Pt returned via [facility] from cath lab.  Monitor cont with SR, no c/o chest pain.  Cath site rt wrist is dry and intact with  good radial pulse, sling present.  Family at bedside.

## 2018-08-04 NOTE — Progress Notes (Addendum)
Progress Note  Patient Name: [name] Date of Encounter: 08/04/2018  Primary Cardiologist: [name], MD  EP: [facility] Roderic Palau, NP)  Subjective   Had CP overnight but resolved with morphine and up titration of nitro. Currently CP free. Awaiting cath later today.   Inpatient Medications    Scheduled Meds: . aspirin EC  81 mg Oral Daily  . atorvastatin  40 mg Oral q1800  . carvedilol  25 mg Oral BID  . diltiazem  240 mg Oral Daily  . ferrous sulfate  325 mg Oral BID  . furosemide  40 mg Intravenous Once  . hydrALAZINE  25 mg Oral BID  . insulin aspart  0-20 Units Subcutaneous TID WC  . insulin aspart  0-5 Units Subcutaneous QHS  . labetalol  10 mg Intravenous Once  . losartan  100 mg Oral Daily  . ondansetron (ZOFRAN) IV  4 mg Intravenous Once  . oxybutynin  15 mg Oral QHS  . risperiDONE  2 mg Oral Daily  . sodium chloride flush  3 mL Intravenous Q12H   Continuous Infusions: . sodium chloride 10 mL/hr at 08/04/18 0300  . sodium chloride Stopped (08/04/18 0534)  . sodium chloride 50 mL/hr at 08/04/18 0534  . diltiazem (CARDIZEM) infusion Stopped (08/03/18 1001)  . heparin 1,600 Units/hr (08/04/18 0533)  . nitroGLYCERIN 60 mcg/min (08/04/18 0300)   PRN Meds: sodium chloride, sodium chloride, labetalol, morphine injection, nitroGLYCERIN, ondansetron (ZOFRAN) IV, polyethylene glycol, sodium chloride flush   Vital Signs    Vitals:   08/04/18 0600 08/04/18 0615 08/04/18 0630 08/04/18 0645  BP: (!) 151/88 (!) 157/102 (!) 157/108 (!) 158/82  Pulse: (!) 110 77 (!) 101 100  Resp:   18 13  Temp:      TempSrc:      SpO2: 96% 94% 100% 96%  Weight:        Intake/Output Summary (Last 24 hours) at 08/04/2018 0740 Last data filed at 08/04/2018 0300 Gross per 24 hour  Intake 784.17 ml  Output 400 ml  Net 384.17 ml   Filed Weights   08/03/18 2328  Weight: (!) 153 kg    Telemetry    afib w/ CVR and 5 beat run of NSVT - Personally Reviewed  ECG    Afib w/ CVR 84 bpm - Personally Reviewed  Physical Exam   GEN: No acute distress.   Neck: No JVD Cardiac:  irregularly irregular rhythm, regular rate, no murmurs, rubs, or gallops.  Respiratory: Clear to auscultation bilaterally. GI: Soft, nontender, non-distended  MS: No edema; No deformity. Neuro:  Nonfocal  Psych: Normal affect   Labs    Chemistry Recent Labs  Lab 08/03/18 0247 08/04/18 0400  NA 137 134*  K 3.9 3.9  CL 101 101  CO2 24 23  GLUCOSE 416* 200*  BUN 14 14  CREATININE 0.77 0.72  CALCIUM 9.4 9.1  GFRNONAA >60 >60  GFRAA >60 >60  ANIONGAP 12 10     Hematology Recent Labs  Lab 08/03/18 0247 08/04/18 0400  WBC 7.0 7.9  RBC 3.76* 3.47*  HGB 10.2* 9.4*  HCT 34.2* 32.0*  MCV 91.0 92.2  MCH 27.1 27.1  MCHC 29.8* 29.4*  RDW 15.9* 15.7*  PLT 288 248    Cardiac Enzymes Recent Labs  Lab 08/03/18 1308 08/03/18 1549 08/03/18 2039 08/04/18 0400  TROPONINI 1.33* 2.14* 3.26* 2.70*    Recent Labs  Lab 08/03/18 0304  TROPIPOC 0.02     BNP Recent Labs  Lab  08/03/18 0304  BNP 493.9*     DDimer No results for input(s): DDIMER in the last 168 hours.   Radiology    Dg Chest 2 View  Result Date: 08/03/2018 CLINICAL DATA:  Chest pain and shortness of breath beginning 8 hours ago. EXAM: CHEST - 2 VIEW COMPARISON:  04/13/2018 FINDINGS: Shallow inspiration. Cardiac enlargement. No vascular congestion, edema, or consolidation. No blunting of costophrenic angles. No pneumothorax. Mediastinal contours appear intact. Soft tissue attenuation limits examination. IMPRESSION: Cardiac enlargement. No evidence of active pulmonary disease. Electronically Signed   By: Lucienne Capers M.D.   On: 08/03/2018 03:29   Ct Angio Chest Pe W And/or Wo Contrast  Result Date: 08/03/2018 CLINICAL DATA:  Left chest pain, worse with palpation. EXAM: CT ANGIOGRAPHY CHEST WITH CONTRAST TECHNIQUE: Multidetector CT imaging of the chest was performed using the standard protocol  during bolus administration of intravenous contrast. Multiplanar CT image reconstructions and MIPs were obtained to evaluate the vascular anatomy. CONTRAST:  131mL ISOVUE-370 IOPAMIDOL (ISOVUE-370) INJECTION 76% COMPARISON:  12/23/2017 FINDINGS: Cardiovascular: Good opacification of the central and segmental pulmonary arteries. No focal filling defects. No evidence of significant pulmonary embolus. Cardiac enlargement. Normal caliber thoracic aorta. No pericardial effusions. Mediastinum/Nodes: Prominent mediastinal lymph nodes, similar to prior study. Likely reactive. Lungs/Pleura: Calcified granulomas. No airspace disease or consolidation. No pleural effusions. No pneumothorax. Upper Abdomen: No acute abnormalities demonstrated. Musculoskeletal: No chest wall abnormality. No acute or significant osseous findings. Review of the MIP images confirms the above findings. IMPRESSION: No evidence of significant pulmonary embolus. Cardiac enlargement. Lungs clear. Electronically Signed   By: Lucienne Capers M.D.   On: 08/03/2018 05:44    Cardiac Studies   2D Echo 08/03/18 Study Conclusions  - Left ventricle: The cavity size was normal. Wall thickness was   increased in a pattern of moderate to severe LVH. Systolic   function was normal. The estimated ejection fraction was in the   range of 60% to 65%. Wall motion was normal; there were no   regional wall motion abnormalities. LV diastolic function not   assessed on this study. - Left atrium: The atrium was moderately dilated. - Right atrium: The atrium was mildly dilated. - Pulmonary arteries: PA peak pressure: 48 mm Hg (S).   Patient Profile     [name] is a 52 y.o. female with a hx of D-CHF,persistentAfib w/ failed DCCV 02/2018 & 04/05/2018, HTN,obesity,MV 2017 w/ nl perfusionand EF 73%, SVT, chronic intermittent chest pain,CHA2DS2-VASc =3 (female, CHF, HTN) on Xarelto,OSAnot on CPAP,mediastinal adenopathy, basilar groundglass  infiltrates, and prior Mycobacterium avium infection who is being seen today for the evaluation of chest pain and elevated trop at the request of Dr Tawanna Solo.  Assessment & Plan    1. Chest Pain/NSTEMI: currently CP free. Troponin peaked at 3.26. Echo shows normal LVEF and no WMA. Plan is for [facility] today +/-PCI. Continue IV heparin, ASA, statin and BB.   2. Persistent Atrial Fibrillation: Ventricular rate is controlled. She has failed DCCV x 2. Followed in afib clinic. Continue rate control w/ Coreg and Cardizem. CHA2DS2 VASc score =4. Continue IV heparin for now. Resume Xarelto once all invasive procedures are completed.   3. HTN: elevated this morning at 158/82. Scheduled to get AM meds, Coreg, Cardizem, labetalol and losartan. Lasix on hold for cath. Monitor.   4. Chronic Diastolic CHF: appears euvolemic on exam. Lasix is on hold for cath today. Resume post cath. Echo shows normal LVEF, 60-65%.   5. NSVT: 5 beat  run noted on tele. EF normal. K normal at 3.9. Mg was low yesterday at 1.6 but Mg supplementation was given. Plan for LHC today to exclude coronary ischemia. Continue BB therapy w/ Coreg. Will continue to monitor on tele.   For questions or updates, please contact [place] Please consult [email or web address] for contact info under      Signed, Lyda Jester, PA-C  08/04/2018, 7:40 AM    As above, patient seen and examined.  Patient had chest pain overnight but now resolved.  Her troponins are mildly elevated.  Echocardiogram shows normal LV function.  Plan to proceed with cardiac catheterization today.  Continue aspirin, heparin, carvedilol and statin.  Continue carvedilol and Cardizem for rate control of atrial fibrillation.  Resume Xarelto after all procedures complete.  Lasix is on hold for cardiac catheterization.  Resume following procedure. BP improved this AM; follow and advance meds as needed.  [name], MD

## 2018-08-04 NOTE — Progress Notes (Signed)
PROGRESS NOTE    [name]  NOM:[id number] DOB: [birth year] DOA: 08/03/2018 PCP: Vanessa Pounds, NP      Brief Narrative:  [name] is a 52 y.o. F with Bipolar disorder, morbid obesity, Afib on Xarelto, recent LGIB, NIDDM, dCHF, pHTN and HTN who presented with chest pain at rest, found to have Afib with RVR, then troponin elevation up to 3.2 ng/ml.  Cardiology were consulted, started heparin, have recmomended LHC today.   Assessment & Plan:  NSTEMI Troponin has peaked at 3.2 -Continue aspirin, carvedilol, atorvastatin -Continue heparin -Cardiology consulted, appreciate expert cares -To Cath today -Defer Plavix to Cardiology   Atrial fibrillation, chronic with RVR CHADS2Vasc 4.  On Xarelto.  This was supposed to be restarted after last hospitalization (for bleeding colonic polyps), but wasn't.  Currently on heparin, can restart Xarelto when heparin discontinued.  Rates improved overnight with diltiazem and carvedilol -Continue heparin -Continue home diltiazem and carvedilol  Chronic diastolic CHF Hypertension Pulmonary hypertension Appears euvolemic.  BP improved this morning with addition hydralazine. -Hold furosemide/K, restart after cath -Continue carvedilol, diltiazem, hydralazine, losartan  Diabetes Glucoses somewhat elevated -Hold home metfromin, glipizide -Resume SSI when able to take PO  Bipolar disorder -Continue risperidone  Anemia of acute blood loss, recent LGIB Stable relative to recent baseline.  No concerns for recurrent bleeding yet.  Other medications -Continue oxybutynin     MDM and disposition: The below labs and imaging reports were reviewed and summarized above.  Medication management as above.  The patient was admitted with chest pain.  Found to have acute myocardial infarction.  To cath today.  Anticoagulation management as above.        DVT prophylaxis: N/A Code Status: FULL Family Communication: Family member at  bedside    Consultants:   Cardiology  Procedures:   Left heart cath 11/7  Antimicrobials:   None    Subjective: Chest pain is resolved.  She has no fever, malaise, sweats.  No vomiting.  No dizziness.  She has mild dyspnea.  Objective: Vitals:   08/04/18 0630 08/04/18 0645 08/04/18 0730 08/04/18 0900  BP: (!) 157/108 (!) 158/82  133/86  Pulse: (!) 101 100  (!) 106  Resp: 18 13  11   Temp:   98.2 F (36.8 C)   TempSrc:   Oral   SpO2: 100% 96%  100%  Weight:        Intake/Output Summary (Last 24 hours) at 08/04/2018 1034 Last data filed at 08/04/2018 0300 Gross per 24 hour  Intake 723.4 ml  Output -  Net 723.4 ml   Filed Weights   08/03/18 2328  Weight: (!) 153 kg    Examination: General appearance: Obese adult female, alert and in no acute distress.  Sitting in chair. HEENT: Anicteric, conjunctiva pink, lids and lashes normal. No nasal deformity, discharge, epistaxis.  Lips moist, dentition normal, no oral lesions, OP moist, hearing normal.   Skin: Warm and dry.  No suspicious rashes or lesions. Cardiac: Irregualr, rate normal, nl S1-S2, no murmurs appreciated.  Capillary refill is brisk.  JVP not visible.  No LE edema.  Radia  pulses 2+ and symmetric. Respiratory: Normal respiratory rate and rhythm.  CTAB without rales or wheezes. Abdomen: Abdomen soft.  No TTP. No ascites, distension, hepatosplenomegaly.   MSK: No deformities or effusions. Neuro: Awake and alert.  EOMI, moves all extremities. Speech fluent.    Psych: Sensorium intact and responding to questions, attention normal. Affect normal.  Judgment and insight appear normal.  Data Reviewed: I have personally reviewed following labs and imaging studies:  CBC: Recent Labs  Lab 08/03/18 0247 08/04/18 0400  WBC 7.0 7.9  NEUTROABS 5.9  --   HGB 10.2* 9.4*  HCT 34.2* 32.0*  MCV 91.0 92.2  PLT 288 540   Basic Metabolic Panel: Recent Labs  Lab 08/03/18 0247 08/03/18 1308 08/04/18 0400  NA  137  --  134*  K 3.9  --  3.9  CL 101  --  101  CO2 24  --  23  GLUCOSE 416*  --  200*  BUN 14  --  14  CREATININE 0.77  --  0.72  CALCIUM 9.4  --  9.1  MG  --  1.6*  --    GFR: Estimated Creatinine Clearance: 130.4 mL/min (by C-G formula based on SCr of 0.72 mg/dL). Liver Function Tests: No results for input(s): AST, ALT, ALKPHOS, BILITOT, PROT, ALBUMIN in the last 168 hours. No results for input(s): LIPASE, AMYLASE in the last 168 hours. No results for input(s): AMMONIA in the last 168 hours. Coagulation Profile: Recent Labs  Lab 08/03/18 1429  INR 1.07   Cardiac Enzymes: Recent Labs  Lab 08/03/18 1308 08/03/18 1549 08/03/18 2039 08/04/18 0400  TROPONINI 1.33* 2.14* 3.26* 2.70*   BNP (last 3 results) No results for input(s): PROBNP in the last 8760 hours. HbA1C: No results for input(s): HGBA1C in the last 72 hours. CBG: Recent Labs  Lab 08/03/18 0612 08/03/18 1124 08/03/18 1621 08/03/18 2111 08/04/18 0752  GLUCAP 225* 308* 144* 215* 201*   Lipid Profile: No results for input(s): CHOL, HDL, LDLCALC, TRIG, CHOLHDL, LDLDIRECT in the last 72 hours. Thyroid Function Tests: No results for input(s): TSH, T4TOTAL, FREET4, T3FREE, THYROIDAB in the last 72 hours. Anemia Panel: No results for input(s): VITAMINB12, FOLATE, FERRITIN, TIBC, IRON, RETICCTPCT in the last 72 hours. Urine analysis:    Component Value Date/Time   COLORURINE YELLOW 04/13/2018 1214   APPEARANCEUR HAZY (A) 04/13/2018 1214   LABSPEC 1.008 04/13/2018 1214   PHURINE 5.0 04/13/2018 1214   GLUCOSEU NEGATIVE 04/13/2018 1214   HGBUR SMALL (A) 04/13/2018 1214   BILIRUBINUR NEGATIVE 04/13/2018 1214   BILIRUBINUR neg 09/10/2014 1219   KETONESUR NEGATIVE 04/13/2018 1214   PROTEINUR 30 (A) 04/13/2018 1214   UROBILINOGEN 1.0 09/10/2014 1219   UROBILINOGEN 0.2 12/07/2013 0041   NITRITE NEGATIVE 04/13/2018 1214   LEUKOCYTESUR SMALL (A) 04/13/2018 1214   Sepsis  Labs: @LABRCNTIP (procalcitonin:4,lacticacidven:4)  ) Recent Results (from the past 240 hour(s))  MRSA PCR Screening     Status: None   Collection Time: 08/03/18  7:18 AM  Result Value Ref Range Status   MRSA by PCR NEGATIVE NEGATIVE Final    Comment:        The GeneXpert MRSA Assay (FDA approved for NASAL specimens only), is one component of a comprehensive MRSA colonization surveillance program. It is not intended to diagnose MRSA infection nor to guide or monitor treatment for MRSA infections. Performed at [facility], [place] [address] [place], [place] [address]          Radiology Studies: Dg Chest 2 View  Result Date: 08/03/2018 CLINICAL DATA:  Chest pain and shortness of breath beginning 8 hours ago. EXAM: CHEST - 2 VIEW COMPARISON:  04/13/2018 FINDINGS: Shallow inspiration. Cardiac enlargement. No vascular congestion, edema, or consolidation. No blunting of costophrenic angles. No pneumothorax. Mediastinal contours appear intact. Soft tissue attenuation limits examination. IMPRESSION: Cardiac enlargement. No evidence of active pulmonary disease. Electronically  Signed   By: Lucienne Capers M.D.   On: 08/03/2018 03:29   Ct Angio Chest Pe W And/or Wo Contrast  Result Date: 08/03/2018 CLINICAL DATA:  Left chest pain, worse with palpation. EXAM: CT ANGIOGRAPHY CHEST WITH CONTRAST TECHNIQUE: Multidetector CT imaging of the chest was performed using the standard protocol during bolus administration of intravenous contrast. Multiplanar CT image reconstructions and MIPs were obtained to evaluate the vascular anatomy. CONTRAST:  111mL ISOVUE-370 IOPAMIDOL (ISOVUE-370) INJECTION 76% COMPARISON:  12/23/2017 FINDINGS: Cardiovascular: Good opacification of the central and segmental pulmonary arteries. No focal filling defects. No evidence of significant pulmonary embolus. Cardiac enlargement. Normal caliber thoracic aorta. No pericardial effusions. Mediastinum/Nodes:  Prominent mediastinal lymph nodes, similar to prior study. Likely reactive. Lungs/Pleura: Calcified granulomas. No airspace disease or consolidation. No pleural effusions. No pneumothorax. Upper Abdomen: No acute abnormalities demonstrated. Musculoskeletal: No chest wall abnormality. No acute or significant osseous findings. Review of the MIP images confirms the above findings. IMPRESSION: No evidence of significant pulmonary embolus. Cardiac enlargement. Lungs clear. Electronically Signed   By: Lucienne Capers M.D.   On: 08/03/2018 05:44        Scheduled Meds: . aspirin EC  81 mg Oral Daily  . atorvastatin  40 mg Oral q1800  . carvedilol  25 mg Oral BID  . diltiazem  240 mg Oral Daily  . ferrous sulfate  325 mg Oral BID  . furosemide  40 mg Intravenous Once  . hydrALAZINE  25 mg Oral TID  . insulin aspart  0-20 Units Subcutaneous TID WC  . insulin aspart  0-5 Units Subcutaneous QHS  . labetalol  10 mg Intravenous Once  . losartan  100 mg Oral Daily  . ondansetron (ZOFRAN) IV  4 mg Intravenous Once  . oxybutynin  15 mg Oral QHS  . risperiDONE  2 mg Oral Daily  . sodium chloride flush  3 mL Intravenous Q12H   Continuous Infusions: . sodium chloride 10 mL/hr at 08/04/18 0300  . sodium chloride Stopped (08/04/18 0534)  . sodium chloride 50 mL/hr at 08/04/18 0534  . heparin 1,600 Units/hr (08/04/18 0533)  . nitroGLYCERIN 65 mcg/min (08/04/18 0901)     LOS: 1 day    Time spent: 35 minutes    [name], MD [facility] 08/04/2018, 10:34 AM     Pager [phone] --- please page though AMION:  [email or web address] Password [email or web address] If 7PM-7AM, please contact night-coverage

## 2018-08-04 NOTE — Progress Notes (Signed)
ANTICOAGULATION CONSULT NOTE - Follow Up Consult  Pharmacy Consult for heparin Indication: chest pain/ACS  Allergies  Allergen Reactions  . Acetaminophen Other (See Comments)    Seizure-like "fits" as a child  . Caffeine     Tense, anxiety, increased urination  . [place] [Dapagliflozin] Other (See Comments)    Hallucinations, drop in blood sugar  . Lisinopril Rash    Rash with lisinopril; but fosinopril is ok per patient    Patient Measurements: Weight: (!) 337 lb 4.9 oz (153 kg) Heparin Dosing Weight:   Vital Signs: Temp: 99 F (37.2 C) (11/07 0350) Temp Source: Oral (11/07 0350) BP: 155/83 (11/07 0230) Pulse Rate: 25 (11/07 0230)  Labs: Recent Labs    08/03/18 0247  08/03/18 1429 08/03/18 1549 08/03/18 2039 08/04/18 0400  HGB 10.2*  --   --   --   --  9.4*  HCT 34.2*  --   --   --   --  32.0*  PLT 288  --   --   --   --  248  APTT  --   --  31  --   --   --   LABPROT  --   --  13.8  --   --   --   INR  --   --  1.07  --   --   --   HEPARINUNFRC  --   --  <0.10*  --  0.21* 0.43  CREATININE 0.77  --   --   --   --  0.72  TROPONINI  --    < >  --  2.14* 3.26* 2.70*   < > = values in this interval not displayed.    Estimated Creatinine Clearance: 130.4 mL/min (by C-G formula based on SCr of 0.72 mg/dL).   Medications:  Infusions:  . sodium chloride 10 mL/hr at 08/04/18 0300  . sodium chloride Stopped (08/04/18 0534)  . sodium chloride 50 mL/hr at 08/04/18 0534  . diltiazem (CARDIZEM) infusion Stopped (08/03/18 1001)  . heparin 1,600 Units/hr (08/04/18 0533)  . nitroGLYCERIN 60 mcg/min (08/04/18 0300)    Assessment: Patient with heparin level at goal.  No heparin issues noted.  Goal of Therapy:  Heparin level 0.3-0.7 units/ml Monitor platelets by anticoagulation protocol: Yes   Plan:  Continue heparin drip at current rate Recheck level at [place], Lyndon Crowford 08/04/2018,5:59 AM

## 2018-08-05 ENCOUNTER — Encounter (HOSPITAL_COMMUNITY): Payer: Self-pay | Admitting: Interventional Cardiology

## 2018-08-05 LAB — CBC
HEMATOCRIT: 30.4 % — AB (ref 36.0–46.0)
Hemoglobin: 8.8 g/dL — ABNORMAL LOW (ref 12.0–15.0)
MCH: 26.7 pg (ref 26.0–34.0)
MCHC: 28.9 g/dL — ABNORMAL LOW (ref 30.0–36.0)
MCV: 92.4 fL (ref 80.0–100.0)
NRBC: 0 % (ref 0.0–0.2)
Platelets: 257 10*3/uL (ref 150–400)
RBC: 3.29 MIL/uL — AB (ref 3.87–5.11)
RDW: 15.9 % — AB (ref 11.5–15.5)
WBC: 5.8 10*3/uL (ref 4.0–10.5)

## 2018-08-05 LAB — BASIC METABOLIC PANEL
ANION GAP: 9 (ref 5–15)
BUN: 25 mg/dL — ABNORMAL HIGH (ref 6–20)
CHLORIDE: 101 mmol/L (ref 98–111)
CO2: 25 mmol/L (ref 22–32)
Calcium: 9 mg/dL (ref 8.9–10.3)
Creatinine, Ser: 1.55 mg/dL — ABNORMAL HIGH (ref 0.44–1.00)
GFR calc Af Amer: 44 mL/min — ABNORMAL LOW (ref >60)
GFR calc non Af Amer: 38 mL/min — ABNORMAL LOW (ref >60)
GLUCOSE: 237 mg/dL — AB (ref 70–99)
POTASSIUM: 4.5 mmol/L (ref 3.5–5.1)
Sodium: 135 mmol/L (ref 135–145)

## 2018-08-05 LAB — URINALYSIS, ROUTINE W REFLEX MICROSCOPIC
BACTERIA UA: NONE SEEN
Bilirubin Urine: NEGATIVE
Glucose, UA: 50 mg/dL — AB
KETONES UR: NEGATIVE mg/dL
Leukocytes, UA: NEGATIVE
Nitrite: NEGATIVE
PH: 5 (ref 5.0–8.0)
PROTEIN: 100 mg/dL — AB
Specific Gravity, Urine: 1.031 — ABNORMAL HIGH (ref 1.005–1.030)
WAXY CASTS UA: 24

## 2018-08-05 LAB — CREATININE, URINE, RANDOM: CREATININE, URINE: 350.58 mg/dL

## 2018-08-05 LAB — GLUCOSE, CAPILLARY
GLUCOSE-CAPILLARY: 189 mg/dL — AB (ref 70–99)
Glucose-Capillary: 235 mg/dL — ABNORMAL HIGH (ref 70–99)
Glucose-Capillary: 281 mg/dL — ABNORMAL HIGH (ref 70–99)
Glucose-Capillary: 263 mg/dL — ABNORMAL HIGH (ref 70–99)

## 2018-08-05 LAB — SODIUM, URINE, RANDOM: Sodium, Ur: <10 mmol/L

## 2018-08-05 MED ORDER — INSULIN GLARGINE 100 UNIT/ML ~~LOC~~ SOLN
12.0000 [IU] | Freq: Every day | SUBCUTANEOUS | Status: DC
  Administered 2018-08-05 – 2018-08-06 (×2): 12 [IU] via SUBCUTANEOUS
  Filled 2018-08-05 (×2): qty 0.12

## 2018-08-05 MED ORDER — SODIUM CHLORIDE 0.9 % IV SOLN
INTRAVENOUS | Status: DC
  Administered 2018-08-05: 08:00:00 via INTRAVENOUS

## 2018-08-05 NOTE — Progress Notes (Addendum)
Progress Note  Patient Name: [name] Date of Encounter: 08/05/2018  Primary Cardiologist: [name], MD   Subjective   Pt sleeping. Not contributing much today. Will briefly wake when stimulated but quickly returns to sleep. Normal breathing. Does not appear to be in any distress.   Inpatient Medications    Scheduled Meds: . aspirin EC  81 mg Oral Daily  . atorvastatin  40 mg Oral q1800  . carvedilol  25 mg Oral BID  . diltiazem  240 mg Oral Daily  . ferrous sulfate  325 mg Oral BID  . furosemide  40 mg Intravenous Once  . hydrALAZINE  25 mg Oral TID  . insulin aspart  0-20 Units Subcutaneous TID WC  . insulin aspart  0-5 Units Subcutaneous QHS  . labetalol  10 mg Intravenous Once  . ondansetron (ZOFRAN) IV  4 mg Intravenous Once  . oxybutynin  15 mg Oral QHS  . risperiDONE  2 mg Oral Daily  . rivaroxaban  20 mg Oral Q supper  . sodium chloride flush  3 mL Intravenous Q12H   Continuous Infusions: . sodium chloride    . sodium chloride 100 mL/hr at 08/05/18 0803   PRN Meds: sodium chloride, labetalol, morphine injection, nitroGLYCERIN, ondansetron (ZOFRAN) IV, ondansetron (ZOFRAN) IV, polyethylene glycol, sodium chloride flush   Vital Signs    Vitals:   08/04/18 1700 08/04/18 1816 08/04/18 2002 08/05/18 0550  BP: 109/69 101/66 111/69 (!) 141/70  Pulse: 80 72 72 63  Resp: (!) 23 20 18 18   Temp:  98.7 F (37.1 C) 98.4 F (36.9 C) 98 F (36.7 C)  TempSrc:  Oral Oral Oral  SpO2: 98% 97% 92% 99%  Weight:        Intake/Output Summary (Last 24 hours) at 08/05/2018 0809 Last data filed at 08/05/2018 0600 Gross per 24 hour  Intake 1471.82 ml  Output -  Net 1471.82 ml   Filed Weights   08/03/18 2328  Weight: (!) 153 kg    Telemetry    afib 107, no Ventricular arrthymias - Personally Reviewed  ECG    Not performed today - Personally Reviewed  Physical Exam   GEN: morbidly obese female in No acute distress.   Neck: No JVD Cardiac:  irregularly irregular, mildly tachy rate Respiratory: Clear to auscultation bilaterally. GI: Soft, nontender, non-distended  MS: No edema; No deformity. Neuro:  Nonfocal  Psych: Normal affect   Labs    Chemistry Recent Labs  Lab 08/03/18 0247 08/04/18 0400 08/05/18 0545  NA 137 134* 135  K 3.9 3.9 4.5  CL 101 101 101  CO2 24 23 25   GLUCOSE 416* 200* 237*  BUN 14 14 25*  CREATININE 0.77 0.72 1.55*  CALCIUM 9.4 9.1 9.0  GFRNONAA >60 >60 38*  GFRAA >60 >60 44*  ANIONGAP 12 10 9      Hematology Recent Labs  Lab 08/03/18 0247 08/04/18 0400 08/05/18 0545  WBC 7.0 7.9 5.8  RBC 3.76* 3.47* 3.29*  HGB 10.2* 9.4* 8.8*  HCT 34.2* 32.0* 30.4*  MCV 91.0 92.2 92.4  MCH 27.1 27.1 26.7  MCHC 29.8* 29.4* 28.9*  RDW 15.9* 15.7* 15.9*  PLT 288 248 257    Cardiac Enzymes Recent Labs  Lab 08/03/18 1308 08/03/18 1549 08/03/18 2039 08/04/18 0400  TROPONINI 1.33* 2.14* 3.26* 2.70*    Recent Labs  Lab 08/03/18 0304  TROPIPOC 0.02     BNP Recent Labs  Lab 08/03/18 0304  BNP 493.9*  DDimer No results for input(s): DDIMER in the last 168 hours.   Radiology    No results found.  Cardiac Studies   LHC 08/04/18 Procedures   LEFT HEART CATH AND CORONARY ANGIOGRAPHY  Conclusion    Widely patent coronary arteries.  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  Courses between PA and AO.  Mild dynamic compression is noted.  Hypercontractile left ventricle.  Marked elevation in LVEDP, 26 mmHg.  EF greater than 65%.  Ventricular fibrillation treated with defibrillation x1.  Conversion to normal sinus rhythm after defibrillation.  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  Resume anticoagulation, likely Xarelto since coronaries are clean.  Further management per treating team.  Discontinue nitroglycerin IV.  Recommend to resume Rivaroxaban, at currently prescribed dose and  frequency, on today   2D Echo 08/03/18 Study Conclusions  - Left ventricle: The cavity size was normal. Wall thickness was   increased in a pattern of moderate to severe LVH. Systolic   function was normal. The estimated ejection fraction was in the   range of 60% to 65%. Wall motion was normal; there were no   regional wall motion abnormalities. LV diastolic function not   assessed on this study. - Left atrium: The atrium was moderately dilated. - Right atrium: The atrium was mildly dilated. - Pulmonary arteries: PA peak pressure: 48 mm Hg (S).  Patient Profile     [name] a 52 y.o.femalewith a hx of D-CHF,persistentAfib w/ failed DCCV 02/2018 & 04/05/2018, HTN,obesity,MV 2017 w/ nl perfusionand EF 73%, SVT, chronic intermittent chest pain,CHA2DS2-VASc =3 (female, CHF, HTN) on Xarelto,OSAnot on CPAP,mediastinal adenopathy, basilar groundglass infiltrates, and prior Mycobacterium avium infectionwho is being seen today for the evaluation of chest pain and elevated tropat the request of Dr Tawanna Solo.  Assessment & Plan    1. Chest Pain/Elevated Troponin: Troponin peaked at 3.26. Echo shows normal LVEF and no WMA. No significant valvular abnormalities. [place] 11/7 showed normal coronaries. Chest CT 11/6 negative for PE. No additional cardiac w/u at this time. Continue management of cardiac risk factors.   2. Persistent Atrial Fibrillation: Ventricular rate is controlled. She has failed DCCV x 2. Followed in afib clinic. Continue rate control w/ Coreg and Cardizem. CHA2DS2 VASc score =4. Xarelto has been resumed.   3. HTN: mildly elevated this morning at 141/70. Scheduled to get AM meds including Coreg, Cardizem, labetalol, and lasix. Losartan on hold due to AKI. Monitor.   4. Chronic Diastolic CHF: Echo shows normal LVEF, 60-65%. Continue management of BP and rate control of afib. She is currently getting IVFs for AKI. Monitor volume status closely.   5. NSVT: 5 beat  run noted on tele yesterday but no recurrence noted today. EF normal. Electrolytes are WNL. Cath showed no coronary disease. Continue BB therapy w/ Coreg. Will continue to monitor on tele.   6. V-fib: occurred during cardiac cath and treated with defibrillation x 1 w/ conversion back to NSR. Cath showed normal coronaries. No recurrent ventricular arrhthymias on tele.  7. AKI: SCr bumped post cath, up from 0.72>>1.55.  Hold Losartan and Metformin. She is getting IVF hydration. Monitor renal function closely. Also monitor volume status given diastolic HF.   8. DM: hold Metformin given recent cardiac cath and AKI. Insulin coverage ordered. [place] per IM.   9. Anemia: Gradual drop in Hgb over last several days, 10.2>>9.4>>8.8. Radial cath site appears stable. Per records, there is a h/o LGIB. She is on  supplemental Fe but also requiring Xarelto for stroke prophylaxis given afib. Will need to monitor closely.   For questions or updates, please contact [place] Please consult [email or web address] for contact info under        Signed, Lyda Jester, PA-C  08/05/2018, 8:09 AM   As above, patient seen and examined.  Patient denies CP or dyspnea this morning.  Cath results noted.  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  There was mild compression.  I discussed this with Dr. Tamala Julian and he felt medical therapy likely best option as otherwise would need coronary artery bypass and graft.  Plan to continue carvedilol and Cardizem both for blood pressure and rate control of atrial fibrillation.  Xarelto resumed; DC ASA.  Note she also has diastolic congestive heart failure and left ventricular end-diastolic pressure was elevated at time of catheterization.  However renal function is worse today.  There may be a component of contrast nephropathy.  Hold Lasix and losartan today.  Resume  tomorrow if renal function stable and then could likely be discharged.  Blood pressure was borderline yesterday.  We will hold hydralazine. Will discontinue IV fluids given history of diastolic congestive heart failure and elevated left ventricular end-diastolic pressure at time of catheterization.  [name], MD

## 2018-08-05 NOTE — Progress Notes (Signed)
PROGRESS NOTE    [name]  EHM:[id number] DOB: [birth year] DOA: 08/03/2018 PCP: [name], NP      Brief Narrative:  [name] is a 52 y.o. F with Bipolar disorder, morbid obesity, Afib on Xarelto, recent LGIB, NIDDM, dCHF, pHTN and HTN who presented with chest pain at rest, found to have Afib with RVR, then troponin elevation up to 3.2 ng/ml.  Cardiology were consulted, started heparin, have recmomended LHC today.        Assessment & Plan:  Elevated troponin NSTEMI, ruled out Troponin has peaked at 3.2.  Started on heparin, LHC however with clean coronaries. The patient has some LV hypertrophy, Afib with RVR, troponin likely all demand supply mismatch.  Telemetry reviewed, unremarkable. -Continue aspirin, carvedilol, atorvastatin -Cardiology consulted, appreciate expert cares  AKI Likely contrast induced (got iodine from CT angiogram chest then from cardiac angiography). Other considerations include that this is from congestion. -Losartan held -IV fluids gently -Trend Cr -Check urine electrolytes, UA   Atrial fibrillation, chronic with RVR CHADS2Vasc 4.  On Xarelto.  This was supposed to be restarted after last hospitalization (for bleeding colonic polyps), but wasn't.  Cardioverted during left heart cath, now in sinus rhythm overnight. - Resume Xarelto -Continue diltiazem carvedilol   Chronic diastolic CHF Hypertension Pulmonary hypertension Endorses class IV symptoms, mostly cannot walk more than 4 feet at baseline before getting out of breath.  She endorses more orthopnea than usual.  No leg swelling.    -Check BNP tomorrow -Continue carvedilol, diltiazem, hydralazine -Hold losartan - Hold furosemide   Diabetes Glucoses elevated -Hold home metfromin, glipizide -SSI with meals -Start Lantus in hospital  Bipolar disorder No active disease -Continue risperidone  Anemia of acute blood loss, recent LGIB Stable relative to recent baseline, 8-10.   No concerns for recurrent bleeding yet.  Other medications -Continue oxybutynin     MDM and disposition: The below labs and imaging reports reviewed and summarized above.  Medication management as above.  The patient was admitted with chest pain, found to have what was believed to be ACS.  She went to a left heart cath yesterday, which showed clean coronaries.  Likely this was some demand ischemia.  Now unfortunately she has had a new acute kidney injury, which is a severe acute illness with possible serious morbidity, possibly from IV contrast.  We will give gentle IV fluids, hold her ARB, and tender creatinine.         DVT prophylaxis: Xarelto Code Status: FULL Family Communication: Mother    Consultants:   Cardiology  Procedures:   Left heart cath 11/7  Widely patent coronary arteries.  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  Courses between PA and AO.  Mild dynamic compression is noted.  Hypercontractile left ventricle.  Marked elevation in LVEDP, 26 mmHg.  EF greater than 65%.  Ventricular fibrillation treated with defibrillation x1.  Conversion to normal sinus rhythm after defibrillation.  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  Resume anticoagulation, likely Xarelto since coronaries are clean.  Further management per treating team.  Discontinue nitroglycerin IV.     Antimicrobials:   None    Subjective: No more chest pain.  Her dyspnea is at baseline.  She has more orthopnea than usual.  Otherwise no fever, malaise, sweats.  No cough, sputum, pleuritic pain.  No vomiting, dizziness.     Objective: Vitals:   08/04/18 1816 08/04/18 2002 08/05/18 0550 08/05/18 1318  BP:  101/66 111/69 (!) 141/70 92/60  Pulse: 72 72 63 (!) 58  Resp: 20 18 18 18   Temp: 98.7 F (37.1 C) 98.4 F (36.9 C) 98 F (36.7 C) 98.7 F (37.1 C)  TempSrc: Oral Oral Oral Oral  SpO2: 97%  92% 99% (!) 89%  Weight:        Intake/Output Summary (Last 24 hours) at 08/05/2018 1328 Last data filed at 08/05/2018 1237 Gross per 24 hour  Intake 1763.52 ml  Output -  Net 1763.52 ml   Filed Weights   08/03/18 2328  Weight: (!) 153 kg    Examination: General appearance: Obese adult female, sitting in recliner, no acute distress, appears dyspneic with transfers from the chair to the bed. HEENT: Anicteric, conjunctival pink, lids and lashes normal.  No nasal deformity, discharge, or epistaxis.  Lips moist, dentition normal, no oral lesions, oropharynx moist, hearing normal. Skin: Skin warm and dry without suspicious rashes or lesions. Cardiac: Normal rate and rhythm, no murmurs appreciated, JVP not visible due to body habitus, no lower extremity edema. Respiratory: Appears slightly dyspneic with exertion, pursed lip breathing, no rales or wheezes. Abdomen: Abdomen soft.  No TTP. No ascites, distension, hepatosplenomegaly.   MSK: No deformities or effusions. Neuro: Awake and alert, extraocular movements intact, moves all extremities with 5 strength and normal coordination, speech fluent.    Psych: Sensorium intact and responding to questions, attention normal, affect normal.    Data Reviewed: I have personally reviewed following labs and imaging studies:  CBC: Recent Labs  Lab 08/03/18 0247 08/04/18 0400 08/05/18 0545  WBC 7.0 7.9 5.8  NEUTROABS 5.9  --   --   HGB 10.2* 9.4* 8.8*  HCT 34.2* 32.0* 30.4*  MCV 91.0 92.2 92.4  PLT 288 248 308   Basic Metabolic Panel: Recent Labs  Lab 08/03/18 0247 08/03/18 1308 08/04/18 0400 08/05/18 0545  NA 137  --  134* 135  K 3.9  --  3.9 4.5  CL 101  --  101 101  CO2 24  --  23 25  GLUCOSE 416*  --  200* 237*  BUN 14  --  14 25*  CREATININE 0.77  --  0.72 1.55*  CALCIUM 9.4  --  9.1 9.0  MG  --  1.6*  --   --    GFR: Estimated Creatinine Clearance: 67.3 mL/min (A) (by C-G formula based on SCr of 1.55 mg/dL (H)). Liver  Function Tests: No results for input(s): AST, ALT, ALKPHOS, BILITOT, PROT, ALBUMIN in the last 168 hours. No results for input(s): LIPASE, AMYLASE in the last 168 hours. No results for input(s): AMMONIA in the last 168 hours. Coagulation Profile: Recent Labs  Lab 08/03/18 1429  INR 1.07   Cardiac Enzymes: Recent Labs  Lab 08/03/18 1308 08/03/18 1549 08/03/18 2039 08/04/18 0400  TROPONINI 1.33* 2.14* 3.26* 2.70*   BNP (last 3 results) No results for input(s): PROBNP in the last 8760 hours. HbA1C: No results for input(s): HGBA1C in the last 72 hours. CBG: Recent Labs  Lab 08/04/18 1229 08/04/18 1638 08/04/18 2057 08/05/18 0748 08/05/18 1133  GLUCAP 212* 248* 222* 235* 281*   Lipid Profile: No results for input(s): CHOL, HDL, LDLCALC, TRIG, CHOLHDL, LDLDIRECT in the last 72 hours. Thyroid Function Tests: No results for input(s): TSH, T4TOTAL, FREET4, T3FREE, THYROIDAB in the last 72 hours. Anemia Panel: No results for input(s): VITAMINB12, FOLATE, FERRITIN, TIBC, IRON, RETICCTPCT in the last 72 hours. Urine analysis:    Component Value Date/Time  COLORURINE YELLOW 04/13/2018 1214   APPEARANCEUR HAZY (A) 04/13/2018 1214   LABSPEC 1.008 04/13/2018 1214   PHURINE 5.0 04/13/2018 1214   GLUCOSEU NEGATIVE 04/13/2018 1214   HGBUR SMALL (A) 04/13/2018 1214   BILIRUBINUR NEGATIVE 04/13/2018 1214   BILIRUBINUR neg 09/10/2014 1219   KETONESUR NEGATIVE 04/13/2018 1214   PROTEINUR 30 (A) 04/13/2018 1214   UROBILINOGEN 1.0 09/10/2014 1219   UROBILINOGEN 0.2 12/07/2013 0041   NITRITE NEGATIVE 04/13/2018 1214   LEUKOCYTESUR SMALL (A) 04/13/2018 1214   Sepsis Labs: @LABRCNTIP (procalcitonin:4,lacticacidven:4)  ) Recent Results (from the past 240 hour(s))  MRSA PCR Screening     Status: None   Collection Time: 08/03/18  7:18 AM  Result Value Ref Range Status   MRSA by PCR NEGATIVE NEGATIVE Final    Comment:        The GeneXpert MRSA Assay (FDA approved for NASAL  specimens only), is one component of a comprehensive MRSA colonization surveillance program. It is not intended to diagnose MRSA infection nor to guide or monitor treatment for MRSA infections. Performed at [facility], [place] [address], [place], [place] [address]          Radiology Studies: No results found.      Scheduled Meds: . atorvastatin  40 mg Oral q1800  . carvedilol  25 mg Oral BID  . diltiazem  240 mg Oral Daily  . ferrous sulfate  325 mg Oral BID  . furosemide  40 mg Intravenous Once  . insulin aspart  0-20 Units Subcutaneous TID WC  . insulin aspart  0-5 Units Subcutaneous QHS  . labetalol  10 mg Intravenous Once  . ondansetron (ZOFRAN) IV  4 mg Intravenous Once  . oxybutynin  15 mg Oral QHS  . risperiDONE  2 mg Oral Daily  . rivaroxaban  20 mg Oral Q supper  . sodium chloride flush  3 mL Intravenous Q12H   Continuous Infusions: . sodium chloride       LOS: 2 days    Time spent: 35 minutes   [name], MD [facility] 08/05/2018, 1:28 PM     Pager [phone] --- please page though AMION:  [email or web address] If 7PM-7AM, please contact night-coverage

## 2018-08-06 ENCOUNTER — Inpatient Hospital Stay (HOSPITAL_COMMUNITY): Payer: Medicaid Other

## 2018-08-06 DIAGNOSIS — N179 Acute kidney failure, unspecified: Secondary | ICD-10-CM

## 2018-08-06 LAB — URINALYSIS, ROUTINE W REFLEX MICROSCOPIC
Bilirubin Urine: NEGATIVE
Glucose, UA: 50 mg/dL — AB
KETONES UR: NEGATIVE mg/dL
Leukocytes, UA: NEGATIVE
Nitrite: NEGATIVE
PROTEIN: 100 mg/dL — AB
Specific Gravity, Urine: 1.018 (ref 1.005–1.030)
pH: 5 (ref 5.0–8.0)

## 2018-08-06 LAB — BASIC METABOLIC PANEL
Anion gap: 10 (ref 5–15)
Anion gap: 10 (ref 5–15)
BUN: 38 mg/dL — AB (ref 6–20)
BUN: 43 mg/dL — AB (ref 6–20)
CALCIUM: 8.7 mg/dL — AB (ref 8.9–10.3)
CHLORIDE: 95 mmol/L — AB (ref 98–111)
CHLORIDE: 98 mmol/L (ref 98–111)
CO2: 21 mmol/L — ABNORMAL LOW (ref 22–32)
CO2: 23 mmol/L (ref 22–32)
CREATININE: 2.37 mg/dL — AB (ref 0.44–1.00)
CREATININE: 2.01 mg/dL — AB (ref 0.44–1.00)
Calcium: 9.1 mg/dL (ref 8.9–10.3)
GFR calc Af Amer: 32 mL/min — ABNORMAL LOW (ref >60)
GFR calc non Af Amer: 23 mL/min — ABNORMAL LOW (ref >60)
GFR calc non Af Amer: 28 mL/min — ABNORMAL LOW (ref >60)
GFR, EST AFRICAN AMERICAN: 26 mL/min — AB (ref >60)
GLUCOSE: 319 mg/dL — AB (ref 70–99)
Glucose, Bld: 320 mg/dL — ABNORMAL HIGH (ref 70–99)
POTASSIUM: 4.4 mmol/L (ref 3.5–5.1)
Potassium: 4.6 mmol/L (ref 3.5–5.1)
Sodium: 126 mmol/L — ABNORMAL LOW (ref 135–145)
Sodium: 131 mmol/L — ABNORMAL LOW (ref 135–145)

## 2018-08-06 LAB — CBC
HCT: 30.7 % — ABNORMAL LOW (ref 36.0–46.0)
Hemoglobin: 9.1 g/dL — ABNORMAL LOW (ref 12.0–15.0)
MCH: 27.5 pg (ref 26.0–34.0)
MCHC: 29.6 g/dL — AB (ref 30.0–36.0)
MCV: 92.7 fL (ref 80.0–100.0)
Platelets: 307 10*3/uL (ref 150–400)
RBC: 3.31 MIL/uL — AB (ref 3.87–5.11)
RDW: 15.9 % — AB (ref 11.5–15.5)
WBC: 7.3 10*3/uL (ref 4.0–10.5)
nRBC: 0 % (ref 0.0–0.2)

## 2018-08-06 LAB — OSMOLALITY, URINE: OSMOLALITY UR: 398 mosm/kg (ref 300–900)

## 2018-08-06 LAB — GLUCOSE, CAPILLARY
GLUCOSE-CAPILLARY: 315 mg/dL — AB (ref 70–99)
Glucose-Capillary: 257 mg/dL — ABNORMAL HIGH (ref 70–99)
Glucose-Capillary: 220 mg/dL — ABNORMAL HIGH (ref 70–99)

## 2018-08-06 LAB — OSMOLALITY: Osmolality: 304 mOsm/kg — ABNORMAL HIGH (ref 275–295)

## 2018-08-06 LAB — CREATININE, URINE, RANDOM: CREATININE, URINE: 197.25 mg/dL

## 2018-08-06 LAB — NA AND K (SODIUM & POTASSIUM), RAND UR
Potassium Urine: 45 mmol/L
Sodium, Ur: <10 mmol/L

## 2018-08-06 MED ORDER — INSULIN GLARGINE 100 UNIT/ML ~~LOC~~ SOLN
18.0000 [IU] | Freq: Every day | SUBCUTANEOUS | Status: DC
  Filled 2018-08-06: qty 0.18

## 2018-08-06 MED ORDER — DIPHENHYDRAMINE HCL 25 MG PO CAPS
25.0000 mg | ORAL_CAPSULE | Freq: Once | ORAL | Status: AC
  Administered 2018-08-06: 25 mg via ORAL
  Filled 2018-08-06: qty 1

## 2018-08-06 MED ORDER — SODIUM CHLORIDE 0.9 % IV SOLN: INTRAVENOUS | Status: DC

## 2018-08-06 MED ORDER — INSULIN ASPART 100 UNIT/ML ~~LOC~~ SOLN
4.0000 [IU] | Freq: Three times a day (TID) | SUBCUTANEOUS | Status: DC
  Administered 2018-08-06 – 2018-08-09 (×9): 4 [IU] via SUBCUTANEOUS

## 2018-08-06 MED ORDER — HYDROCORTISONE 0.5 % EX CREA
TOPICAL_CREAM | Freq: Once | CUTANEOUS | Status: AC
  Administered 2018-08-06: 03:00:00 via TOPICAL
  Filled 2018-08-06: qty 28.35

## 2018-08-06 NOTE — Progress Notes (Signed)
Progress Note  Patient Name: [name] Date of Encounter: 08/06/2018  Primary Cardiologist: Dr. [name]  Subjective   No chest pain or palpitations.  Inpatient Medications    Scheduled Meds: . atorvastatin  40 mg Oral q1800  . carvedilol  25 mg Oral BID  . diltiazem  240 mg Oral Daily  . ferrous sulfate  325 mg Oral BID  . furosemide  40 mg Intravenous Once  . insulin aspart  0-20 Units Subcutaneous TID WC  . insulin aspart  0-5 Units Subcutaneous QHS  . insulin glargine  12 Units Subcutaneous Daily  . labetalol  10 mg Intravenous Once  . ondansetron (ZOFRAN) IV  4 mg Intravenous Once  . oxybutynin  15 mg Oral QHS  . risperiDONE  2 mg Oral Daily  . rivaroxaban  20 mg Oral Q supper  . sodium chloride flush  3 mL Intravenous Q12H   Continuous Infusions: . sodium chloride     PRN Meds: sodium chloride, labetalol, morphine injection, nitroGLYCERIN, ondansetron (ZOFRAN) IV, polyethylene glycol, sodium chloride flush   Vital Signs    Vitals:   08/05/18 1500 08/05/18 2132 08/05/18 2237 08/06/18 0524  BP: 110/68 105/66 122/75 (!) 103/52  Pulse: 61 60 (!) 57 (!) 55  Resp:  18 14 18   Temp:  98.4 F (36.9 C)  98.1 F (36.7 C)  TempSrc:  Oral  Oral  SpO2: 92% 97% 98% (!) 89%  Weight:        Intake/Output Summary (Last 24 hours) at 08/06/2018 0927 Last data filed at 08/06/2018 0833 Gross per 24 hour  Intake 960 ml  Output -  Net 960 ml   Filed Weights   08/03/18 2328  Weight: (!) 153 kg    Telemetry    Sinus rhythm with PACs and brief bursts of SVT. Personally reviewed.  Physical Exam   GEN: Morbidly obese. No acute distress.   Neck: No JVD. Cardiac: RRR with ectopy, no gallop.  Respiratory: Nonlabored. Clear to auscultation bilaterally. GI: Soft, nontender, bowel sounds present. MS: No edema; No deformity. Neuro:  Nonfocal. Psych: Alert and oriented x 3. Normal affect.  Labs    Chemistry Recent Labs  Lab 08/04/18 0400 08/05/18 0545  08/06/18 0654  NA 134* 135 126*  K 3.9 4.5 4.6  CL 101 101 95*  CO2 23 25 21*  GLUCOSE 200* 237* 320*  BUN 14 25* 38*  CREATININE 0.72 1.55* 2.37*  CALCIUM 9.1 9.0 8.7*  GFRNONAA >60 38* 23*  GFRAA >60 44* 26*  ANIONGAP 10 9 10      Hematology Recent Labs  Lab 08/04/18 0400 08/05/18 0545 08/06/18 0449  WBC 7.9 5.8 7.3  RBC 3.47* 3.29* 3.31*  HGB 9.4* 8.8* 9.1*  HCT 32.0* 30.4* 30.7*  MCV 92.2 92.4 92.7  MCH 27.1 26.7 27.5  MCHC 29.4* 28.9* 29.6*  RDW 15.7* 15.9* 15.9*  PLT 248 257 307    Cardiac Enzymes Recent Labs  Lab 08/03/18 1308 08/03/18 1549 08/03/18 2039 08/04/18 0400  TROPONINI 1.33* 2.14* 3.26* 2.70*    Recent Labs  Lab 08/03/18 0304  TROPIPOC 0.02     BNP Recent Labs  Lab 08/03/18 0304  BNP 493.9*     Radiology    US Renal  Result Date: 08/06/2018 CLINICAL DATA:  Acute kidney injury. EXAM: RENAL / URINARY TRACT ULTRASOUND COMPLETE COMPARISON:  None. FINDINGS: Right Kidney: Renal measurements: 13.1 x 5.8 x 6.5 cm = volume: 253 mL. Question slightly increased echogenicity. No mass or  hydronephrosis visualized. Left Kidney: Renal measurements: 11.6 x 6.2 x 6.5 cm = volume: 245 mL. Increased echogenicity in the left kidney without hydronephrosis. No suspicious renal mass. Bladder: Difficult to exclude bladder wall thickening or bladder wall irregularity. However, the bladder is incompletely distended. IMPRESSION: Negative for hydronephrosis. Question increased echogenicity in the kidneys, particularly in the left kidney. Limited evaluation of the bladder but cannot exclude bladder wall thickening or irregularity. Electronically Signed   By: Markus Daft M.D.   On: 08/06/2018 09:13    Cardiac Studies   Echocardiogram 08/03/2018: Study Conclusions  - Left ventricle: The cavity size was normal. Wall thickness was   increased in a pattern of moderate to severe LVH. Systolic   function was normal. The estimated ejection fraction was in the   range of  60% to 65%. Wall motion was normal; there were no   regional wall motion abnormalities. LV diastolic function not   assessed on this study. - Left atrium: The atrium was moderately dilated. - Right atrium: The atrium was mildly dilated. - Pulmonary arteries: PA peak pressure: 48 mm Hg (S).  Cardiac catheterization 08/04/2018:  Widely patent coronary arteries.  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  Courses between PA and AO.  Mild dynamic compression is noted.  Hypercontractile left ventricle.  Marked elevation in LVEDP, 26 mmHg.  EF greater than 65%.  Ventricular fibrillation treated with defibrillation x1.  Conversion to normal sinus rhythm after defibrillation.  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  Resume anticoagulation, likely Xarelto since coronaries are clean.  Further management per treating team.  Discontinue nitroglycerin IV.  Patient Profile     52 y.o. 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  Assessment & Plan    1. Type 2 NSTEMI with troponin I up to 3.26.  LVEF 60 to 65% and no obstructive CAD by cardiac catheterization.  2.  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  To be managed medically per discussion between Dr. [name] and Dr. Tamala Julian.  3.  Persistent atrial fibrillation, now in sinus rhythm following episode of VF requiring defibrillation during cardiac catheterization.  Chads vas score is 4.  Xarelto resumed.  Patient currently bradycardic on  combination of Coreg and Cardizem CD.  4.  Acute renal failure, creatinine increased from 1.55-2.37.  Possibly contrast induced.  Lasix and ARB held.  Also off metformin.  5.  Stable anemia, hemoglobin 8.8-9.1.  Continue Xarelto.  Plan to stop Cardizem CD for now and continue Coreg alone.  Will gently hydrate.  Also stay off of Cozaar and Lasix, follow-up BMET in a.m.  Not ready for discharge.  Signed, Rozann Lesches, MD  08/06/2018, 9:27 AM

## 2018-08-06 NOTE — Progress Notes (Addendum)
PROGRESS NOTE    [name]  HER:[id number] DOB: 05-13-1966 DOA: 08/03/2018 PCP: [name], NP      Brief Narrative:  [name] is a 52 y.o. F with Bipolar disorder, morbid obesity, Afib on Xarelto, recent LGIB, NIDDM, dCHF, pHTN and HTN who presented with chest pain at rest, found to have Afib with RVR, then troponin elevation up to 3.2 ng/ml.  Cardiology were consulted, started heparin, have recmomended LHC today.        Assessment & Plan:  AKI Woresning today, Cr up to 2.4.  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  No nephritis, UA ordered this AM, bland.  Cholesterol embolism given no RBC, WBC on UA.  Fena very low, consistent with CIN. -Hold diuretics and losartan for now -No metformin given in hospital -Discussed with Nephrology, will hold fluids for now monitor -Renal ultrasound ordered -Monitor I/Os   Elevated troponin NSTEMI, ruled out Troponin has peaked at 3.2.  Started on heparin, LHC however with clean coronaries. The patient has some LV hypertrophy, Afib with RVR, troponin likely all demand supply mismatch.  Telemetry reviewed, unremarkable. -Continue aspirin, carvedilol, atorvastatin -Cardiology consulted, appreciate expert cares  Atrial fibrillation, chronic with RVR CHADS2Vasc 4.  On Xarelto.  This was supposed to be restarted after last hospitalization (for bleeding colonic polyps), but wasn't.  Cardioverted during left heart cath, now in sinus rhythm overnight. -Continue Xarelto -Continue carvedilol -Stop diltiazem  Chronic diastolic CHF Hypertension Pulmonary hypertension Endorses class IV symptoms, mostly cannot walk more than 4 feet at baseline before getting out of breath.  She endorses more orthopnea than usual.  No leg swelling.    -Check BNP -Continue carvedilol, hydralazine  -Hold losartan -Hold diuretics  Diabetes GLucoses worse -Hold metfromin,  glipizide -SSI, add mealtime scheduled -Continue Lantus, increase dose  Bipolar disorder No active disease -Continue risperidone  Anemia of acute blood loss, recent LGIB Stable, 8-10.  No concerns for recurrent bleeding yet.  Other medications -Continue oxybutynin     MDM and disposition: Below labs and imaging reports were reviewed and summarized above.  Medication management as above.  The patient was admitted with chest pain, found to have it was believed to be ACS.  She went a left heart cath, found to have no coronary disease, likely demand ischemia.  Unfortunately she developed post-cath acute renal failure, likely ATN from contrast induced nephropathy.  Above studies ordered, nephrology consulted, trend BMP    DVT prophylaxis: Xarelto Code Status: FULL Family Communication: None present    Consultants:   Cardiology  Nephrology  Procedures:   Left heart cath 11/7  Widely patent coronary arteries.  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  Courses between PA and AO.  Mild dynamic compression is noted.  Hypercontractile left ventricle.  Marked elevation in LVEDP, 26 mmHg.  EF greater than 65%.  Ventricular fibrillation treated with defibrillation x1.  Conversion to normal sinus rhythm after defibrillation.  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  Resume anticoagulation, likely Xarelto since coronaries are clean.  Further management per treating team.  Discontinue nitroglycerin IV.     Antimicrobials:   None    Subjective: No chest pain, no change in dyspnea.  No new orthopnea, no change in dyspnea on exertion, no leg swelling.  No cough, sputum, pleuritic pain, no vomiting, dizziness, fever, malaise, sweats.  Objective: Vitals:   08/05/18 1500 08/05/18 2132 08/05/18  2237 08/06/18 0524  BP: 110/68 105/66 122/75 (!) 103/52  Pulse: 61 60 (!) 57 (!) 55  Resp:   18 14 18   Temp:  98.4 F (36.9 C)  98.1 F (36.7 C)  TempSrc:  Oral  Oral  SpO2: 92% 97% 98% (!) 89%  Weight:        Intake/Output Summary (Last 24 hours) at 08/06/2018 1318 Last data filed at 08/06/2018 1505 Gross per 24 hour  Intake 720 ml  Output -  Net 720 ml   Filed Weights   08/03/18 2328  Weight: (!) 153 kg    Examination: General appearance: Obese female, sitting in recliner, no acute distress.  Not as dyspneic as yesterday. HEENT: Anicteric, conjunctival pink, lids and lashes normal.  No nasal deformity, discharge, or epistaxis.  Lips moist, dentition normal, no oral lesions, oropharynx moist, hearing normal. Skin: Skin warm and dry, no suspicious rashes or lesions.   Cardiac: Normal rate and rhythm, no murmurs appreciated, JVP not visible due to body habitus, no lower extremity edema. Respiratory: Slightly dyspneic with exertion, but no rales or wheezes, normal rate and respiratory rhythm at rest. Abdomen: Abdomen soft without tenderness to palpation, ascites, distention. MSK: No deformities or effusions of the large joints of the upper lower extremities bilaterally.  No clubbing. Neuro: Awake and alert, extraocular movements intact, moves all extremities with normal strength coordination.  Speech fluent. Psych: Sensorium intact and responding to questions, attention normal, affect normal..    Data Reviewed: I have personally reviewed following labs and imaging studies:  CBC: Recent Labs  Lab 08/03/18 0247 08/04/18 0400 08/05/18 0545 08/06/18 0449  WBC 7.0 7.9 5.8 7.3  NEUTROABS 5.9  --   --   --   HGB 10.2* 9.4* 8.8* 9.1*  HCT 34.2* 32.0* 30.4* 30.7*  MCV 91.0 92.2 92.4 92.7  PLT 288 248 257 697   Basic Metabolic Panel: Recent Labs  Lab 08/03/18 0247 08/03/18 1308 08/04/18 0400 08/05/18 0545 08/06/18 0654  NA 137  --  134* 135 126*  K 3.9  --  3.9 4.5 4.6  CL 101  --  101 101 95*  CO2 24  --  23 25 21*  GLUCOSE 416*  --  200* 237* 320*  BUN 14   --  14 25* 38*  CREATININE 0.77  --  0.72 1.55* 2.37*  CALCIUM 9.4  --  9.1 9.0 8.7*  MG  --  1.6*  --   --   --    GFR: Estimated Creatinine Clearance: 44 mL/min (A) (by C-G formula based on SCr of 2.37 mg/dL (H)). Liver Function Tests: No results for input(s): AST, ALT, ALKPHOS, BILITOT, PROT, ALBUMIN in the last 168 hours. No results for input(s): LIPASE, AMYLASE in the last 168 hours. No results for input(s): AMMONIA in the last 168 hours. Coagulation Profile: Recent Labs  Lab 08/03/18 1429  INR 1.07   Cardiac Enzymes: Recent Labs  Lab 08/03/18 1308 08/03/18 1549 08/03/18 2039 08/04/18 0400  TROPONINI 1.33* 2.14* 3.26* 2.70*   BNP (last 3 results) No results for input(s): PROBNP in the last 8760 hours. HbA1C: No results for input(s): HGBA1C in the last 72 hours. CBG: Recent Labs  Lab 08/05/18 1133 08/05/18 1615 08/05/18 2131 08/06/18 0734 08/06/18 1124  GLUCAP 281* 189* 263* 257* 315*   Lipid Profile: No results for input(s): CHOL, HDL, LDLCALC, TRIG, CHOLHDL, LDLDIRECT in the last 72 hours. Thyroid Function Tests: No results for input(s): TSH, T4TOTAL, FREET4, T3FREE, THYROIDAB in  the last 72 hours. Anemia Panel: No results for input(s): VITAMINB12, FOLATE, FERRITIN, TIBC, IRON, RETICCTPCT in the last 72 hours. Urine analysis:    Component Value Date/Time   COLORURINE YELLOW 08/06/2018 0753   APPEARANCEUR CLEAR 08/06/2018 0753   LABSPEC 1.018 08/06/2018 0753   PHURINE 5.0 08/06/2018 0753   GLUCOSEU 50 (A) 08/06/2018 0753   HGBUR MODERATE (A) 08/06/2018 0753   BILIRUBINUR NEGATIVE 08/06/2018 0753   BILIRUBINUR neg 09/10/2014 1219   KETONESUR NEGATIVE 08/06/2018 0753   PROTEINUR 100 (A) 08/06/2018 0753   UROBILINOGEN 1.0 09/10/2014 1219   UROBILINOGEN 0.2 12/07/2013 0041   NITRITE NEGATIVE 08/06/2018 0753   LEUKOCYTESUR NEGATIVE 08/06/2018 0753   Sepsis Labs: @LABRCNTIP (procalcitonin:4,lacticacidven:4)  ) Recent Results (from the past 240  hour(s))  MRSA PCR Screening     Status: None   Collection Time: 08/03/18  7:18 AM  Result Value Ref Range Status   MRSA by PCR NEGATIVE NEGATIVE Final    Comment:        The GeneXpert MRSA Assay (FDA approved for NASAL specimens only), is one component of a comprehensive MRSA colonization surveillance program. It is not intended to diagnose MRSA infection nor to guide or monitor treatment for MRSA infections. Performed at [facility], [place] [address], [place], [place] [address]          Radiology Studies: US Renal  Result Date: 08/06/2018 CLINICAL DATA:  Acute kidney injury. EXAM: RENAL / URINARY TRACT ULTRASOUND COMPLETE COMPARISON:  None. FINDINGS: Right Kidney: Renal measurements: 13.1 x 5.8 x 6.5 cm = volume: 253 mL. Question slightly increased echogenicity. No mass or hydronephrosis visualized. Left Kidney: Renal measurements: 11.6 x 6.2 x 6.5 cm = volume: 245 mL. Increased echogenicity in the left kidney without hydronephrosis. No suspicious renal mass. Bladder: Difficult to exclude bladder wall thickening or bladder wall irregularity. However, the bladder is incompletely distended. IMPRESSION: Negative for hydronephrosis. Question increased echogenicity in the kidneys, particularly in the left kidney. Limited evaluation of the bladder but cannot exclude bladder wall thickening or irregularity. Electronically Signed   By: Markus Daft M.D.   On: 08/06/2018 09:13        Scheduled Meds: . atorvastatin  40 mg Oral q1800  . carvedilol  25 mg Oral BID  . ferrous sulfate  325 mg Oral BID  . insulin aspart  0-20 Units Subcutaneous TID WC  . insulin aspart  0-5 Units Subcutaneous QHS  . insulin glargine  12 Units Subcutaneous Daily  . labetalol  10 mg Intravenous Once  . ondansetron (ZOFRAN) IV  4 mg Intravenous Once  . oxybutynin  15 mg Oral QHS  . risperiDONE  2 mg Oral Daily  . rivaroxaban  20 mg Oral Q supper  . sodium chloride flush  3 mL Intravenous  Q12H   Continuous Infusions: . sodium chloride       LOS: 3 days    Time spent: 35 minutes   [name], MD [facility] 08/06/2018, 1:18 PM     Pager [phone] --- please page though AMION:  [email or web address] If 7PM-7AM, please contact night-coverage

## 2018-08-07 DIAGNOSIS — N179 Acute kidney failure, unspecified: Secondary | ICD-10-CM

## 2018-08-07 LAB — BRAIN NATRIURETIC PEPTIDE: B NATRIURETIC PEPTIDE 5: 569.8 pg/mL — AB (ref 0.0–100.0)

## 2018-08-07 LAB — BASIC METABOLIC PANEL
Anion gap: 9 (ref 5–15)
BUN: 44 mg/dL — ABNORMAL HIGH (ref 6–20)
CHLORIDE: 99 mmol/L (ref 98–111)
CO2: 23 mmol/L (ref 22–32)
CREATININE: 1.84 mg/dL — AB (ref 0.44–1.00)
Calcium: 8.9 mg/dL (ref 8.9–10.3)
GFR, EST AFRICAN AMERICAN: 36 mL/min — AB (ref >60)
GFR, EST NON AFRICAN AMERICAN: 31 mL/min — AB (ref >60)
Glucose, Bld: 417 mg/dL — ABNORMAL HIGH (ref 70–99)
POTASSIUM: 4.2 mmol/L (ref 3.5–5.1)
SODIUM: 131 mmol/L — AB (ref 135–145)

## 2018-08-07 LAB — CBC
HCT: 29.4 % — ABNORMAL LOW (ref 36.0–46.0)
HEMOGLOBIN: 8.6 g/dL — AB (ref 12.0–15.0)
MCH: 27.3 pg (ref 26.0–34.0)
MCHC: 29.3 g/dL — AB (ref 30.0–36.0)
MCV: 93.3 fL (ref 80.0–100.0)
PLATELETS: 289 10*3/uL (ref 150–400)
RBC: 3.15 MIL/uL — AB (ref 3.87–5.11)
RDW: 15.5 % (ref 11.5–15.5)
WBC: 6.1 10*3/uL (ref 4.0–10.5)
nRBC: 0 % (ref 0.0–0.2)

## 2018-08-07 LAB — GLUCOSE, CAPILLARY
Glucose-Capillary: 420 mg/dL — ABNORMAL HIGH (ref 70–99)
Glucose-Capillary: 282 mg/dL — ABNORMAL HIGH (ref 70–99)
Glucose-Capillary: 209 mg/dL — ABNORMAL HIGH (ref 70–99)
Glucose-Capillary: 192 mg/dL — ABNORMAL HIGH (ref 70–99)

## 2018-08-07 MED ORDER — DIPHENHYDRAMINE HCL 25 MG PO CAPS
25.0000 mg | ORAL_CAPSULE | Freq: Four times a day (QID) | ORAL | Status: DC | PRN
  Administered 2018-08-08: 25 mg via ORAL
  Filled 2018-08-07 (×2): qty 1

## 2018-08-07 MED ORDER — FUROSEMIDE 10 MG/ML IJ SOLN
40.0000 mg | Freq: Once | INTRAMUSCULAR | Status: AC
  Administered 2018-08-07: 40 mg via INTRAVENOUS
  Filled 2018-08-07: qty 4

## 2018-08-07 MED ORDER — INSULIN GLARGINE 100 UNIT/ML ~~LOC~~ SOLN
24.0000 [IU] | Freq: Every day | SUBCUTANEOUS | Status: DC
  Administered 2018-08-07 – 2018-08-09 (×3): 24 [IU] via SUBCUTANEOUS
  Filled 2018-08-07 (×3): qty 0.24

## 2018-08-07 NOTE — Progress Notes (Signed)
During report, pt noted to have labored breathing with short periods of apnea and grunting at times. Pt stated she felt SOB. Able to talk. O2 Sats 95% RA, lungs clear bilaterally. VSS. +2 pitting edema noted to BLE. Pt also restless. CBG 420, pt complaining that kitchen would not send her cake for breakfast. Per night shift, pt drank several juices last night and lots of fluid. RT assessed pt at bedside. MD paged to come assess patient.

## 2018-08-07 NOTE — Progress Notes (Signed)
Progress Note  Patient Name: [name] Date of Encounter: 08/07/2018  Primary Cardiologist: Dr. [name]  Subjective   More short of breath today but no chest pain.  Inpatient Medications    Scheduled Meds: . atorvastatin  40 mg Oral q1800  . carvedilol  25 mg Oral BID  . ferrous sulfate  325 mg Oral BID  . insulin aspart  0-20 Units Subcutaneous TID WC  . insulin aspart  0-5 Units Subcutaneous QHS  . insulin aspart  4 Units Subcutaneous TID WC  . insulin glargine  24 Units Subcutaneous Daily  . labetalol  10 mg Intravenous Once  . ondansetron (ZOFRAN) IV  4 mg Intravenous Once  . oxybutynin  15 mg Oral QHS  . risperiDONE  2 mg Oral Daily  . rivaroxaban  20 mg Oral Q supper  . sodium chloride flush  3 mL Intravenous Q12H   Continuous Infusions: . sodium chloride     PRN Meds: sodium chloride, labetalol, morphine injection, nitroGLYCERIN, ondansetron (ZOFRAN) IV, polyethylene glycol, sodium chloride flush   Vital Signs    Vitals:   08/06/18 2052 08/07/18 0606 08/07/18 0607 08/07/18 0733  BP: (!) 156/107 (!) 148/80  (!) 155/99  Pulse: 73 74 70 65  Resp: 18 20  (!) 22  Temp: 97.6 F (36.4 C) (!) 97.4 F (36.3 C)    TempSrc: Oral Oral    SpO2: 97% (!) 87% 94% 95%  Weight:        Intake/Output Summary (Last 24 hours) at 08/07/2018 0931 Last data filed at 08/07/2018 0600 Gross per 24 hour  Intake 940 ml  Output -  Net 940 ml   Filed Weights   08/03/18 2328  Weight: (!) 153 kg    Telemetry    Sinus rhythm.  Personally reviewed.  Physical Exam   GEN:  Morbidly obese.  No acute distress.   Neck: No JVD. Cardiac: RRR, no gallop.  Respiratory:  Decreased breath sounds at bases. GI: Soft, nontender, bowel sounds present. MS: No edema; No deformity. Neuro:  Nonfocal. Psych: Alert and oriented x 3. Normal affect.  Labs    Chemistry Recent Labs  Lab 08/06/18 0654 08/06/18 1711 08/07/18 0529  NA 126* 131* 131*  K 4.6 4.4 4.2  CL 95*  98 99  CO2 21* 23 23  GLUCOSE 320* 319* 417*  BUN 38* 43* 44*  CREATININE 2.37* 2.01* 1.84*  CALCIUM 8.7* 9.1 8.9  GFRNONAA 23* 28* 31*  GFRAA 26* 32* 36*  ANIONGAP 10 10 9      Hematology Recent Labs  Lab 08/05/18 0545 08/06/18 0449 08/07/18 0529  WBC 5.8 7.3 6.1  RBC 3.29* 3.31* 3.15*  HGB 8.8* 9.1* 8.6*  HCT 30.4* 30.7* 29.4*  MCV 92.4 92.7 93.3  MCH 26.7 27.5 27.3  MCHC 28.9* 29.6* 29.3*  RDW 15.9* 15.9* 15.5  PLT 257 307 289    Cardiac Enzymes Recent Labs  Lab 08/03/18 1308 08/03/18 1549 08/03/18 2039 08/04/18 0400  TROPONINI 1.33* 2.14* 3.26* 2.70*    Recent Labs  Lab 08/03/18 0304  TROPIPOC 0.02     BNP Recent Labs  Lab 08/03/18 0304 08/07/18 0529  BNP 493.9* 569.8*     Radiology    US Renal  Result Date: 08/06/2018 CLINICAL DATA:  Acute kidney injury. EXAM: RENAL / URINARY TRACT ULTRASOUND COMPLETE COMPARISON:  None. FINDINGS: Right Kidney: Renal measurements: 13.1 x 5.8 x 6.5 cm = volume: 253 mL. Question slightly increased echogenicity. No mass or hydronephrosis visualized.  Left Kidney: Renal measurements: 11.6 x 6.2 x 6.5 cm = volume: 245 mL. Increased echogenicity in the left kidney without hydronephrosis. No suspicious renal mass. Bladder: Difficult to exclude bladder wall thickening or bladder wall irregularity. However, the bladder is incompletely distended. IMPRESSION: Negative for hydronephrosis. Question increased echogenicity in the kidneys, particularly in the left kidney. Limited evaluation of the bladder but cannot exclude bladder wall thickening or irregularity. Electronically Signed   By: Markus Daft M.D.   On: 08/06/2018 09:13    Cardiac Studies   Echocardiogram 08/03/2018: Study Conclusions  - Left ventricle: The cavity size was normal. Wall thickness was increased in a pattern of moderate to severe LVH. Systolic function was normal. The estimated ejection fraction was in the range of 60% to 65%. Wall motion was normal;  there were no regional wall motion abnormalities. LV diastolic function not assessed on this study. - Left atrium: The atrium was moderately dilated. - Right atrium: The atrium was mildly dilated. - Pulmonary arteries: PA peak pressure: 48 mm Hg (S).  Cardiac catheterization 08/04/2018:  Widely patent coronary arteries.  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 Courses between PA and AO. Mild dynamic compression is noted.  Hypercontractile left ventricle. Marked elevation in LVEDP, 26 mmHg. EF greater than 65%.  Ventricular fibrillation treated with defibrillation x1.  Conversion to normal sinus rhythm after defibrillation.  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  Resume anticoagulation, likely Xarelto since coronaries are clean.  Further management per treating team.  Discontinue nitroglycerin IV.  Patient Profile     52 y.o. 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  Assessment & Plan    1.  Type II NSTEMI with troponin I peak of 3.26.  LVEF normal at 60 to 65% without wall motion abnormalities, and no obstructive CAD documented at cardiac catheterization.  2.  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  This is to be managed medically per discussion between Dr. [name] and Dr. Tamala Julian.  3.  Persistent atrial fibrillation, maintaining sinus rhythm following episode of VF during cardiac catheterization requiring defibrillation.  CHADSVASC score is 4.  Xarelto has been  resumed.  4.  Acute renal failure, creatinine has decreased from 2.01-1.84.  She did receive gentle hydration yesterday, this has been discontinued.  ARB also held.  Possibly contrast induced.  Continue Coreg, Lipitor, and Xarelto.  She is being given a single dose of IV Lasix today per primary team with concerns for mild volume overload.  Continue to hold ARB pending improvement in renal function.  Signed, Rozann Lesches, MD  08/07/2018, 9:31 AM

## 2018-08-07 NOTE — Progress Notes (Signed)
Pt difficult to arouse at times. Falls asleep during conversation and difficult to awaken, however then becomes very alert. Pt states she is so sleepy because she did not sleep last night. CBG still 282, pt now alert and repeatedly asking for juice. Pt educated on her having diabetes and how juice raises blood sugar, and hers is already >200-400. Pt continues to state "I need juice, I have to have juice." Pt's mother at bedside, very concerned pt's blood sugar is so high. Mother attempted to get pt to ambulate in hallway to "lower her sugar." Pt was in tears, refusing to walk. Pt sitting up in chair, now relaxed. Will continue to monitor closely.

## 2018-08-07 NOTE — Progress Notes (Signed)
Pt and family at bedside, education provided on diabetes and diet, this RN made pt and family aware no juice/soda during this shift and should follow regiment at home as well. Pt asked "please?" Pt stated has not had a BM in several days, discussed need to drink plain water, diet high in fiber low in sugar/carbs, and mobility as tolerated along with stool softeners if needed. Mother at bedside, daughter not receptive to education. Will continue to monitor and educate.

## 2018-08-07 NOTE — Progress Notes (Signed)
PROGRESS NOTE    [name]  OJJ:[id number] DOB: 27-Dec-1965 DOA: 08/03/2018 PCP: [name], NP      Brief Narrative:  Mrs. Shoun is a 52 y.o. F with Bipolar disorder, morbid obesity, Afib on Xarelto, recent LGIB, NIDDM, dCHF, pHTN and HTN who presented with chest pain at rest, found to have Afib with RVR, then troponin elevation up to 3.2 ng/ml.  Cardiology were consulted, started heparin, have recmomended LHC today.        Assessment & Plan:  AKI Improved now without fluids or diuresis.  Likely this was CIN.  Outputs not recorded by nursing. Renal US unremarkable.  Today very SOB, BNP up, very orthopneic. Discussed plan with Neph.   -Lasix 40 IV now -Hold losartan -Monitor I/Os -No metformin given in hospital   Elevated troponin NSTEMI, ruled out Troponin has peaked at 3.2.  Started on heparin, LHC however with clean coronaries. The patient has some LV hypertrophy, Afib with RVR, troponin likely all demand supply mismatch.  Telemetry reviewed, unremarkable. -Continue aspirin, carvedilol, atorvastatin -Cardiology consulted, appreciate expert cares  Atrial fibrillation, chronic with RVR CHADS2Vasc 4.  On Xarelto.  This was supposed to be restarted after last hospitalization (for bleeding colonic polyps), but wasn't.  Cardioverted during left heart cath, now in sinus rhythm overnight. -Continue Xarelto, carvedilol -Diltiazem stopped for bradycardia  Acute on chronic diastolic CHF Hypertension Pulmonary hypertension Endorses class IV symptoms, mostly cannot walk more than 4 feet at baseline before getting out of breath. Orthopnea way worse overnight, new pitting on exam.  BNP trended up. -Continue carved, hydral -Lasix as above -Hold losartan  Diabetes GLucoses way worse.  Has been drinking full sugar drinks overnight due to thirst. -Increase Lantus -High dose SSI -Mealtime insulin -Low carb diet only -Hold metform, glipiz  Bipolar disorder No active  disease -Continue risperidone  Anemia of acute blood loss, recent LGIB Stable, 8-10.  No concerns for recurrent bleeding yet.  Other medications -Continue oxybutynin     MDM and disposition: The below labs and imaging reports were reviewed and summarized above.  Medication management as above.  The patient was admitted with chest pain, thought to be ACS.  A coronary cath was negative, likely demand ischemia.  Unfortunately she then developed post-cath acute renal failure, likely ATN from CIN.  Her creatinine is already started to improve, but is still double her baseline.  Further, she is now developing congestive heart failure, will start IV Lasix, close monitoring of renal function.      DVT prophylaxis: Xarelto Code Status: FULL Family Communication: None present    Consultants:   Cardiology  Nephrology  Procedures:   Left heart cath 11/7  Widely patent coronary arteries.  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  Courses between PA and AO.  Mild dynamic compression is noted.  Hypercontractile left ventricle.  Marked elevation in LVEDP, 26 mmHg.  EF greater than 65%.  Ventricular fibrillation treated with defibrillation x1.  Conversion to normal sinus rhythm after defibrillation.  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  Resume anticoagulation, likely Xarelto since coronaries are clean.  Further management per treating team.  Discontinue nitroglycerin IV.     Antimicrobials:   None    Subjective: Very orthopneic overnight, cannot lay down even a little bit.  No leg swelling.  No chest pain, wheezing, cough, sputum, vomiting, dizziness.     Objective: Vitals:   08/06/18 2052 08/07/18 0606 08/07/18 9371 08/07/18  0733  BP: (!) 156/107 (!) 148/80  (!) 155/99  Pulse: 73 74 70 65  Resp: 18 20  (!) 22  Temp: 97.6 F (36.4 C) (!) 97.4 F (36.3 C)    TempSrc: Oral  Oral    SpO2: 97% (!) 87% 94% 95%  Weight:        Intake/Output Summary (Last 24 hours) at 08/07/2018 3614 Last data filed at 08/07/2018 0600 Gross per 24 hour  Intake 1060 ml  Output -  Net 1060 ml   Filed Weights   08/03/18 2328  Weight: (!) 153 kg    Examination: General appearance: B's female, sitting in recliner, straight upright, appears uncomfortable for dyspnea. HEENT: Icteric, conjunctival pink, lids and lashes normal.  No nasal deformity, discharge, or epistaxis.  Lips moist, dentition normal, oral takes moist, no oral lesions.  Hearing normal. Skin: Skin warm and dry, no suspicious rashes or lesions..   Cardiac: Heart rate regular, no murmurs appreciated, JVP not visible due to body habitus, no lower extremity edema is noted, 1+ bilaterally Respiratory: Appears dyspneic at rest, pursed lip breathing, tachypnea.  Diminished breath sounds bilaterally, crackles at bases.. Abdomen: Soft without tenderness or guarding. MSK: No deformities or effusions of the large joints of the upper lower extremities bilaterally.  No clubbing. Neuro: Wake and alert, extraocular movements intact, moves all extremities with normal strength and coordination, speech fluent. Psych: Sensorium intact and responding to questions, attention normal, affect normal.    Data Reviewed: I have personally reviewed following labs and imaging studies:  CBC: Recent Labs  Lab 08/03/18 0247 08/04/18 0400 08/05/18 0545 08/06/18 0449 08/07/18 0529  WBC 7.0 7.9 5.8 7.3 6.1  NEUTROABS 5.9  --   --   --   --   HGB 10.2* 9.4* 8.8* 9.1* 8.6*  HCT 34.2* 32.0* 30.4* 30.7* 29.4*  MCV 91.0 92.2 92.4 92.7 93.3  PLT 288 248 257 307 431   Basic Metabolic Panel: Recent Labs  Lab 08/03/18 1308 08/04/18 0400 08/05/18 0545 08/06/18 0654 08/06/18 1711 08/07/18 0529  NA  --  134* 135 126* 131* 131*  K  --  3.9 4.5 4.6 4.4 4.2  CL  --  101 101 95* 98 99  CO2  --  23 25 21* 23 23  GLUCOSE  --  200* 237* 320*  319* 417*  BUN  --  14 25* 38* 43* 44*  CREATININE  --  0.72 1.55* 2.37* 2.01* 1.84*  CALCIUM  --  9.1 9.0 8.7* 9.1 8.9  MG 1.6*  --   --   --   --   --    GFR: Estimated Creatinine Clearance: 56.7 mL/min (A) (by C-G formula based on SCr of 1.84 mg/dL (H)). Liver Function Tests: No results for input(s): AST, ALT, ALKPHOS, BILITOT, PROT, ALBUMIN in the last 168 hours. No results for input(s): LIPASE, AMYLASE in the last 168 hours. No results for input(s): AMMONIA in the last 168 hours. Coagulation Profile: Recent Labs  Lab 08/03/18 1429  INR 1.07   Cardiac Enzymes: Recent Labs  Lab 08/03/18 1308 08/03/18 1549 08/03/18 2039 08/04/18 0400  TROPONINI 1.33* 2.14* 3.26* 2.70*   BNP (last 3 results) No results for input(s): PROBNP in the last 8760 hours. HbA1C: No results for input(s): HGBA1C in the last 72 hours. CBG: Recent Labs  Lab 08/05/18 2131 08/06/18 0734 08/06/18 1124 08/06/18 2051 08/07/18 0720  GLUCAP 263* 257* 315* 220* 420*   Lipid Profile: No results for input(s): CHOL, HDL,  LDLCALC, TRIG, CHOLHDL, LDLDIRECT in the last 72 hours. Thyroid Function Tests: No results for input(s): TSH, T4TOTAL, FREET4, T3FREE, THYROIDAB in the last 72 hours. Anemia Panel: No results for input(s): VITAMINB12, FOLATE, FERRITIN, TIBC, IRON, RETICCTPCT in the last 72 hours. Urine analysis:    Component Value Date/Time   COLORURINE YELLOW 08/06/2018 0753   APPEARANCEUR CLEAR 08/06/2018 0753   LABSPEC 1.018 08/06/2018 0753   PHURINE 5.0 08/06/2018 0753   GLUCOSEU 50 (A) 08/06/2018 0753   HGBUR MODERATE (A) 08/06/2018 0753   BILIRUBINUR NEGATIVE 08/06/2018 0753   BILIRUBINUR neg 09/10/2014 1219   KETONESUR NEGATIVE 08/06/2018 0753   PROTEINUR 100 (A) 08/06/2018 0753   UROBILINOGEN 1.0 09/10/2014 1219   UROBILINOGEN 0.2 12/07/2013 0041   NITRITE NEGATIVE 08/06/2018 0753   LEUKOCYTESUR NEGATIVE 08/06/2018 0753   Sepsis  Labs: @LABRCNTIP (procalcitonin:4,lacticacidven:4)  ) Recent Results (from the past 240 hour(s))  MRSA PCR Screening     Status: None   Collection Time: 08/03/18  7:18 AM  Result Value Ref Range Status   MRSA by PCR NEGATIVE NEGATIVE Final    Comment:        The GeneXpert MRSA Assay (FDA approved for NASAL specimens only), is one component of a comprehensive MRSA colonization surveillance program. It is not intended to diagnose MRSA infection nor to guide or monitor treatment for MRSA infections. Performed at [facility], [place] [address], [place], [place] [address]          Radiology Studies: US Renal  Result Date: 08/06/2018 CLINICAL DATA:  Acute kidney injury. EXAM: RENAL / URINARY TRACT ULTRASOUND COMPLETE COMPARISON:  None. FINDINGS: Right Kidney: Renal measurements: 13.1 x 5.8 x 6.5 cm = volume: 253 mL. Question slightly increased echogenicity. No mass or hydronephrosis visualized. Left Kidney: Renal measurements: 11.6 x 6.2 x 6.5 cm = volume: 245 mL. Increased echogenicity in the left kidney without hydronephrosis. No suspicious renal mass. Bladder: Difficult to exclude bladder wall thickening or bladder wall irregularity. However, the bladder is incompletely distended. IMPRESSION: Negative for hydronephrosis. Question increased echogenicity in the kidneys, particularly in the left kidney. Limited evaluation of the bladder but cannot exclude bladder wall thickening or irregularity. Electronically Signed   By: Markus Daft M.D.   On: 08/06/2018 09:13        Scheduled Meds: . atorvastatin  40 mg Oral q1800  . carvedilol  25 mg Oral BID  . ferrous sulfate  325 mg Oral BID  . furosemide  40 mg Intravenous Once  . insulin aspart  0-20 Units Subcutaneous TID WC  . insulin aspart  0-5 Units Subcutaneous QHS  . insulin aspart  4 Units Subcutaneous TID WC  . insulin glargine  24 Units Subcutaneous Daily  . labetalol  10 mg Intravenous Once  . ondansetron  (ZOFRAN) IV  4 mg Intravenous Once  . oxybutynin  15 mg Oral QHS  . risperiDONE  2 mg Oral Daily  . rivaroxaban  20 mg Oral Q supper  . sodium chloride flush  3 mL Intravenous Q12H   Continuous Infusions: . sodium chloride       LOS: 4 days    Time spent: 35 minutes   [name], MD [facility] 08/07/2018, 8:08 AM     Pager [phone] --- please page though AMION:  [email or web address] Password [email or web address] If 7PM-7AM, please contact night-coverage

## 2018-08-08 DIAGNOSIS — I5033 Acute on chronic diastolic (congestive) heart failure: Secondary | ICD-10-CM

## 2018-08-08 DIAGNOSIS — I480 Paroxysmal atrial fibrillation: Secondary | ICD-10-CM

## 2018-08-08 DIAGNOSIS — I5032 Chronic diastolic (congestive) heart failure: Secondary | ICD-10-CM

## 2018-08-08 LAB — BASIC METABOLIC PANEL
Anion gap: 9 (ref 5–15)
BUN: 37 mg/dL — ABNORMAL HIGH (ref 6–20)
CHLORIDE: 101 mmol/L (ref 98–111)
CO2: 25 mmol/L (ref 22–32)
Calcium: 8.9 mg/dL (ref 8.9–10.3)
Creatinine, Ser: 1.26 mg/dL — ABNORMAL HIGH (ref 0.44–1.00)
GFR calc Af Amer: 57 mL/min — ABNORMAL LOW (ref >60)
GFR, EST NON AFRICAN AMERICAN: 49 mL/min — AB (ref >60)
GLUCOSE: 218 mg/dL — AB (ref 70–99)
POTASSIUM: 4.5 mmol/L (ref 3.5–5.1)
Sodium: 135 mmol/L (ref 135–145)

## 2018-08-08 LAB — CBC
HCT: 29 % — ABNORMAL LOW (ref 36.0–46.0)
HEMOGLOBIN: 8.7 g/dL — AB (ref 12.0–15.0)
MCH: 27.5 pg (ref 26.0–34.0)
MCHC: 30 g/dL (ref 30.0–36.0)
MCV: 91.8 fL (ref 80.0–100.0)
PLATELETS: 298 10*3/uL (ref 150–400)
RBC: 3.16 MIL/uL — AB (ref 3.87–5.11)
RDW: 15.4 % (ref 11.5–15.5)
WBC: 6 10*3/uL (ref 4.0–10.5)
nRBC: 0.3 % — ABNORMAL HIGH (ref 0.0–0.2)

## 2018-08-08 LAB — GLUCOSE, CAPILLARY
GLUCOSE-CAPILLARY: 228 mg/dL — AB (ref 70–99)
GLUCOSE-CAPILLARY: 251 mg/dL — AB (ref 70–99)
Glucose-Capillary: 223 mg/dL — ABNORMAL HIGH (ref 70–99)
Glucose-Capillary: 225 mg/dL — ABNORMAL HIGH (ref 70–99)

## 2018-08-08 MED ORDER — FUROSEMIDE 40 MG PO TABS
40.0000 mg | ORAL_TABLET | Freq: Two times a day (BID) | ORAL | Status: DC
  Administered 2018-08-09: 40 mg via ORAL
  Filled 2018-08-08: qty 1

## 2018-08-08 MED ORDER — FUROSEMIDE 10 MG/ML IJ SOLN: 40.0000 mg | Freq: Two times a day (BID) | INTRAMUSCULAR | Status: DC

## 2018-08-08 MED ORDER — FUROSEMIDE 10 MG/ML IJ SOLN
40.0000 mg | Freq: Two times a day (BID) | INTRAMUSCULAR | Status: AC
  Administered 2018-08-08 (×2): 40 mg via INTRAVENOUS
  Filled 2018-08-08 (×2): qty 4

## 2018-08-08 NOTE — Progress Notes (Addendum)
PROGRESS NOTE    [name]  OTL:[id number] DOB: 1965/11/29 DOA: 08/03/2018 PCP: [name], NP      Brief Narrative:  [name] is a 52 y.o. F with Bipolar disorder, morbid obesity, Afib on Xarelto, recent LGIB, NIDDM, dCHF, pHTN and HTN who presented with chest pain at rest, found to have Afib with RVR, then troponin elevation up to 3.2 ng/ml.  Cardiology were consulted, started heparin, have recmomended LHC today.        Assessment & Plan:  acute kidney injury Contrast-induced nephropathy Resolved -Hold losartan -Monitor I/Os -No metformin given in hospital   Elevated troponin NSTEMI, ruled out Troponin has peaked at 3.2.  Started on heparin, LHC however with clean coronaries. The patient has some LV hypertrophy, Afib with RVR, troponin likely all demand supply mismatch.    -Continue aspirin, carvedilol, atorvastatin -Cardiology consulted, appreciate expert cares  Atrial fibrillation, chronic with RVR CHADS2Vasc 4.  On Xarelto.  This was supposed to be restarted after last hospitalization (for bleeding colonic polyps), but wasn't.  Cardioverted during left heart cath, was back in sinus rhythm, now back in atrial fibrillation. -Continue Xarelto, carvedilol -Diltiazem stopped for bradycardia  Acute on chronic diastolic CHF Hypertension Pulmonary hypertension Endorses class IV symptoms, mostly cannot walk more than 4 feet at baseline before getting out of breath. Orthopnea way worse overnight, new pitting on exam.  BNP trended up. -Continue Lasix for 1 more day, then back home PO -Continue carvedilol, hydralazine -K suppl -Daily weights, I/Os  Diabetes Patient argumentative about diet in the hospital.  Insists on carbohydrate. -Continue Lantus -Continue high-dose sliding scale corrections -Continue mealtime insulin -Hold metform, glipiz  Bipolar disorder No active disease -Continue risperidone  Anemia of acute blood loss, recent LGIB Hgb stable,  8-10.  No concerns for recurrent bleeding yet.  Other medications -Continue oxybutynin  Morbid obesity Due to excess calories.  BMI >50 and HTN, DM        MDM and disposition: The below labs and imaging reports were reviewed and summarized above.  Medication management as above.  The patient was admitted with chest pain, likely ACS.  Her coronary cath was negative, likely demand ischemia.  She then developed post-cath acute renal failure, likely ATN from CIN.  Her creatinine is now resolving, not back to baseline, still doubled from baseline, but resolving.  She is also got acute on chronic congestive heart failure from holding her Lasix.    Likely home tomorrow.    DVT prophylaxis: Xarelto Code Status: FULL Family Communication: None present    Consultants:   Cardiology  Nephrology  Procedures:   Left heart cath 11/7  Widely patent coronary arteries.  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  Courses between PA and AO.  Mild dynamic compression is noted.  Hypercontractile left ventricle.  Marked elevation in LVEDP, 26 mmHg.  EF greater than 65%.  Ventricular fibrillation treated with defibrillation x1.  Conversion to normal sinus rhythm after defibrillation.  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  Resume anticoagulation, likely Xarelto since coronaries are clean.  Further management per treating team.  Discontinue nitroglycerin IV.     Antimicrobials:   None    Subjective: Very sleepy, wheezing, rhonchorous, appears orthopneic.  No leg swelling, chest pain, sputum, fever.   Out of breath with ambulating just a few feet.  Objective: Vitals:   08/08/18 0614 08/08/18 0641 08/08/18 1351 08/08/18 1353  BP: 115/73 131/80 (!) 167/81  Pulse: (!) 57 64 75 71  Resp: 18  20 20   Temp: 97.7 F (36.5 C)  97.6 F (36.4 C)   TempSrc: Oral  Oral   SpO2: 96%  (!) 87%  96%  Weight:        Intake/Output Summary (Last 24 hours) at 08/08/2018 1719 Last data filed at 08/08/2018 1458 Gross per 24 hour  Intake 1770 ml  Output 5150 ml  Net -3380 ml   Filed Weights   08/03/18 2328  Weight: (!) 153 kg    Examination: General appearance: Obese adult female, lying in bed, appears dyspneic. HEENT: Anicteric, conjunctival pink, lids and lashes normal.  No nasal deformity, discharge, epistaxis.  Lips moist, dentition normal.  No oral lesions.  Hearing normal. Skin: Skin warm and dry, no suspicious rashes or lesions..   Cardiac: Heart rate normal, rhythm irregular, JVP not visible due to body habitus, no lower extremity edema noted Respiratory: Peers dyspneic at rest.  No rales or wheezes.  She is somewhat rhonchorous. Abdomen: Soft without tenderness to palpation or guarding. MSK: No deformities or effusions of the large joints of the upper lower extremities bilaterally.  No clubbing. Neuro: Sleepy but extraocular movements intact, moves all extremities with normal strength, coordination, speech fluent. Psych: Sleepy, attention diminished, affect blunted..    Data Reviewed: I have personally reviewed following labs and imaging studies:  CBC: Recent Labs  Lab 08/03/18 0247 08/04/18 0400 08/05/18 0545 08/06/18 0449 08/07/18 0529 08/08/18 0521  WBC 7.0 7.9 5.8 7.3 6.1 6.0  NEUTROABS 5.9  --   --   --   --   --   HGB 10.2* 9.4* 8.8* 9.1* 8.6* 8.7*  HCT 34.2* 32.0* 30.4* 30.7* 29.4* 29.0*  MCV 91.0 92.2 92.4 92.7 93.3 91.8  PLT 288 248 257 307 289 597   Basic Metabolic Panel: Recent Labs  Lab 08/03/18 1308  08/05/18 0545 08/06/18 0654 08/06/18 1711 08/07/18 0529 08/08/18 0521  NA  --    < > 135 126* 131* 131* 135  K  --    < > 4.5 4.6 4.4 4.2 4.5  CL  --    < > 101 95* 98 99 101  CO2  --    < > 25 21* 23 23 25   GLUCOSE  --    < > 237* 320* 319* 417* 218*  BUN  --    < > 25* 38* 43* 44* 37*  CREATININE  --    < > 1.55* 2.37* 2.01* 1.84*  1.26*  CALCIUM  --    < > 9.0 8.7* 9.1 8.9 8.9  MG 1.6*  --   --   --   --   --   --    < > = values in this interval not displayed.   GFR: Estimated Creatinine Clearance: 82.8 mL/min (A) (by C-G formula based on SCr of 1.26 mg/dL (H)). Liver Function Tests: No results for input(s): AST, ALT, ALKPHOS, BILITOT, PROT, ALBUMIN in the last 168 hours. No results for input(s): LIPASE, AMYLASE in the last 168 hours. No results for input(s): AMMONIA in the last 168 hours. Coagulation Profile: Recent Labs  Lab 08/03/18 1429  INR 1.07   Cardiac Enzymes: Recent Labs  Lab 08/03/18 1308 08/03/18 1549 08/03/18 2039 08/04/18 0400  TROPONINI 1.33* 2.14* 3.26* 2.70*   BNP (last 3 results) No results for input(s): PROBNP in the last 8760 hours. HbA1C: No results for input(s): HGBA1C in the last 72 hours. CBG: Recent Labs  Lab 08/07/18 1722 08/07/18 2141 08/08/18 0753 08/08/18 1124 08/08/18 1621  GLUCAP 209* 192* 223* 225* 228*   Lipid Profile: No results for input(s): CHOL, HDL, LDLCALC, TRIG, CHOLHDL, LDLDIRECT in the last 72 hours. Thyroid Function Tests: No results for input(s): TSH, T4TOTAL, FREET4, T3FREE, THYROIDAB in the last 72 hours. Anemia Panel: No results for input(s): VITAMINB12, FOLATE, FERRITIN, TIBC, IRON, RETICCTPCT in the last 72 hours. Urine analysis:    Component Value Date/Time   COLORURINE YELLOW 08/06/2018 0753   APPEARANCEUR CLEAR 08/06/2018 0753   LABSPEC 1.018 08/06/2018 0753   PHURINE 5.0 08/06/2018 0753   GLUCOSEU 50 (A) 08/06/2018 0753   HGBUR MODERATE (A) 08/06/2018 0753   BILIRUBINUR NEGATIVE 08/06/2018 0753   BILIRUBINUR neg 09/10/2014 1219   KETONESUR NEGATIVE 08/06/2018 0753   PROTEINUR 100 (A) 08/06/2018 0753   UROBILINOGEN 1.0 09/10/2014 1219   UROBILINOGEN 0.2 12/07/2013 0041   NITRITE NEGATIVE 08/06/2018 0753   LEUKOCYTESUR NEGATIVE 08/06/2018 0753   Sepsis Labs: @[email or web address] (procalcitonin:4,lacticacidven:4)  ) Recent Results (from  the past 240 hour(s))  MRSA PCR Screening     Status: None   Collection Time: 08/03/18  7:18 AM  Result Value Ref Range Status   MRSA by PCR NEGATIVE NEGATIVE Final    Comment:        The GeneXpert MRSA Assay (FDA approved for NASAL specimens only), is one component of a comprehensive MRSA colonization surveillance program. It is not intended to diagnose MRSA infection nor to guide or monitor treatment for MRSA infections. Performed at [facility], [place] [address], [place], [place] [address]          Radiology Studies: No results found.      Scheduled Meds: . atorvastatin  40 mg Oral q1800  . carvedilol  25 mg Oral BID  . ferrous sulfate  325 mg Oral BID  . furosemide  40 mg Intravenous BID  . [START ON 08/09/2018] furosemide  40 mg Oral BID  . insulin aspart  0-20 Units Subcutaneous TID WC  . insulin aspart  0-5 Units Subcutaneous QHS  . insulin aspart  4 Units Subcutaneous TID WC  . insulin glargine  24 Units Subcutaneous Daily  . labetalol  10 mg Intravenous Once  . ondansetron (ZOFRAN) IV  4 mg Intravenous Once  . oxybutynin  15 mg Oral QHS  . risperiDONE  2 mg Oral Daily  . rivaroxaban  20 mg Oral Q supper  . sodium chloride flush  3 mL Intravenous Q12H   Continuous Infusions: . sodium chloride       LOS: 5 days    Time spent: 25 minutes   [name], MD [facility] 08/08/2018, 5:19 PM     Pager [phone] --- please page though AMION:  [email or web address] Password [email or web address] If 7PM-7AM, please contact night-coverage

## 2018-08-08 NOTE — Progress Notes (Addendum)
Progress Note  Patient Name: [name] Date of Encounter: 08/08/2018  Primary Cardiologist: [name], MD   Subjective   Patient was resting calmly but upon my assessment she was grunting. She says "nothing" when asked what was bothering her. She does not provide any further interaction.   Inpatient Medications    Scheduled Meds: . atorvastatin  40 mg Oral q1800  . carvedilol  25 mg Oral BID  . ferrous sulfate  325 mg Oral BID  . insulin aspart  0-20 Units Subcutaneous TID WC  . insulin aspart  0-5 Units Subcutaneous QHS  . insulin aspart  4 Units Subcutaneous TID WC  . insulin glargine  24 Units Subcutaneous Daily  . labetalol  10 mg Intravenous Once  . ondansetron (ZOFRAN) IV  4 mg Intravenous Once  . oxybutynin  15 mg Oral QHS  . risperiDONE  2 mg Oral Daily  . rivaroxaban  20 mg Oral Q supper  . sodium chloride flush  3 mL Intravenous Q12H   Continuous Infusions: . sodium chloride     PRN Meds: sodium chloride, diphenhydrAMINE, labetalol, morphine injection, nitroGLYCERIN, ondansetron (ZOFRAN) IV, polyethylene glycol, sodium chloride flush   Vital Signs    Vitals:   08/07/18 1515 08/07/18 2141 08/08/18 0614 08/08/18 0641  BP: (!) 146/96 (!) 143/76 115/73 131/80  Pulse:  66 (!) 57 64  Resp:  20 18   Temp:  97.7 F (36.5 C) 97.7 F (36.5 C)   TempSrc:  Oral Oral   SpO2:  94% 96%   Weight:        Intake/Output Summary (Last 24 hours) at 08/08/2018 0803 Last data filed at 08/08/2018 0545 Gross per 24 hour  Intake 1270 ml  Output 1550 ml  Net -280 ml   Filed Weights   08/03/18 2328  Weight: (!) 153 kg    Telemetry    Sinus rhythm 60's-70's - Personally Reviewed  ECG    No new tracings - Personally Reviewed  Physical Exam   GEN: Obese female, No acute distress.   Neck: No JVD Cardiac: RRR, no murmurs, rubs, or gallops.  Respiratory: Clear to auscultation bilaterally. GI: Soft, nontender, non-distended  MS: No edema; No  deformity. Neuro:  Nonfocal  Psych: Normal affect   Labs    Chemistry Recent Labs  Lab 08/06/18 1711 08/07/18 0529 08/08/18 0521  NA 131* 131* 135  K 4.4 4.2 4.5  CL 98 99 101  CO2 23 23 25   GLUCOSE 319* 417* 218*  BUN 43* 44* 37*  CREATININE 2.01* 1.84* 1.26*  CALCIUM 9.1 8.9 8.9  GFRNONAA 28* 31* 49*  GFRAA 32* 36* 57*  ANIONGAP 10 9 9      Hematology Recent Labs  Lab 08/06/18 0449 08/07/18 0529 08/08/18 0521  WBC 7.3 6.1 6.0  RBC 3.31* 3.15* 3.16*  HGB 9.1* 8.6* 8.7*  HCT 30.7* 29.4* 29.0*  MCV 92.7 93.3 91.8  MCH 27.5 27.3 27.5  MCHC 29.6* 29.3* 30.0  RDW 15.9* 15.5 15.4  PLT 307 289 298    Cardiac Enzymes Recent Labs  Lab 08/03/18 1308 08/03/18 1549 08/03/18 2039 08/04/18 0400  TROPONINI 1.33* 2.14* 3.26* 2.70*    Recent Labs  Lab 08/03/18 0304  TROPIPOC 0.02     BNP Recent Labs  Lab 08/03/18 0304 08/07/18 0529  BNP 493.9* 569.8*     DDimer No results for input(s): DDIMER in the last 168 hours.   Radiology    US Renal  Result Date: 08/06/2018 CLINICAL  DATA:  Acute kidney injury. EXAM: RENAL / URINARY TRACT ULTRASOUND COMPLETE COMPARISON:  None. FINDINGS: Right Kidney: Renal measurements: 13.1 x 5.8 x 6.5 cm = volume: 253 mL. Question slightly increased echogenicity. No mass or hydronephrosis visualized. Left Kidney: Renal measurements: 11.6 x 6.2 x 6.5 cm = volume: 245 mL. Increased echogenicity in the left kidney without hydronephrosis. No suspicious renal mass. Bladder: Difficult to exclude bladder wall thickening or bladder wall irregularity. However, the bladder is incompletely distended. IMPRESSION: Negative for hydronephrosis. Question increased echogenicity in the kidneys, particularly in the left kidney. Limited evaluation of the bladder but cannot exclude bladder wall thickening or irregularity. Electronically Signed   By: Markus Daft M.D.   On: 08/06/2018 09:13    Cardiac Studies   Echocardiogram 08/03/2018: Study  Conclusions  - Left ventricle: The cavity size was normal. Wall thickness was increased in a pattern of moderate to severe LVH. Systolic function was normal. The estimated ejection fraction was in the range of 60% to 65%. Wall motion was normal; there were no regional wall motion abnormalities. LV diastolic function not assessed on this study. - Left atrium: The atrium was moderately dilated. - Right atrium: The atrium was mildly dilated. - Pulmonary arteries: PA peak pressure: 48 mm Hg (S).  Cardiac catheterization 08/04/2018:  Widely patent coronary arteries.  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 Courses between PA and AO. Mild dynamic compression is noted.  Hypercontractile left ventricle. Marked elevation in LVEDP, 26 mmHg. EF greater than 65%.  Ventricular fibrillation treated with defibrillation x1.  Conversion to normal sinus rhythm after defibrillation.  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  Resume anticoagulation, likely Xarelto since coronaries are clean.  Further management per treating team.  Discontinue nitroglycerin IV.  Patient Profile     52 y.o. 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  Assessment & Plan    Elevated troponin, Type II NSTEMI -Troponin peaked at 3.26, trended down.  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 [name] and Dr. Tamala Julian.  Persistent atrial fibrillation -Pt had episode of VF requiring defib cardiac cath. -Now maintaining sinus rhythm -CHADSVASC score is 4.  Xarelto has been resumed.  Acute kidney injury -Possibly contrast induced -ARB has been held and she received gentle hydration -SCr up to 2.37, has trended down to 1.26 today  Chronic diastolic CHF -BNP 287.8 yesterday -Home manageemnt includes carvedilol 25 mg bid, lasix 40 mg BID, losartan 100mg  daily -Lasix and losartan have been on hold due to AKI. One dose of lasix 40 mg IV given yesterday.  -Renal function has improved  -Would give another day of IV lasix and then likely transition to oral dosing tomorrow  Hypertension -Losartan has been on hold for renal function -BP mildly elevated yesterday, better this am -Renal function improved, consider resuming home med  Morbid obesity Body mass index is 52.83 kg/m.  -Clearly needs weight reduction but pt does not appear to be willing to try.   For questions or updates, please  contact [place] Please consult [email or web address] for contact info under    Signed, Daune Perch, NP  08/08/2018, 8:03 AM    The patient was seen, examined and discussed with Daune Perch, NP-C and I agree with the above.    52 y.o. female with a historyof D-CHF,persistentAfib w/ failed DCCV 02/2018 &04/05/2018, HTN,obesity,MV 2017 w/ nl perfusionand EF 73%, SVT, chronic intermittent chest pain,CHA2DS2-VASc =3 (female, CHF, HTN) on Xarelto,OSAnot on CPAP,chronic non-compliance who was admitted with NSTEMI, troponin peaked at 3.26, with normal LVEF with no regional wall motion abnormalities and nonobstructive CAD on cardiac catheterization on August 04, 2018.  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 [name] and Dr.  Tamala Julian. The patient remains in atrial fibrillation with controlled rates, on Xarelto. Acute kidney failure that was most probably secondary to contrast nephropathy has resolved, her creatinine today is 1.26.  She has chronic anemia, this should be followed. The patient does not appear fluid overloaded.  [facility] will sign off.   Medication Recommendations: As above, we will arrange for follow-up in 2 weeks and restart Lasix and losartan as needed. Other recommendations (labs, testing, etc): No further testing. Follow up as an outpatient: We will arrange.   Ena Dawley, MD 08/08/2018

## 2018-08-08 NOTE — Progress Notes (Signed)
Pt calling out demanding popsicles multiple times, asking different staff members even after it was clearly discussed. Pt given sugar free jello, encouraged water. Pt non compliant with diet, pt not ready or willing to accept education regarding health status.

## 2018-08-09 ENCOUNTER — Encounter (HOSPITAL_BASED_OUTPATIENT_CLINIC_OR_DEPARTMENT_OTHER): Payer: Medicaid Other

## 2018-08-09 LAB — BASIC METABOLIC PANEL
ANION GAP: 9 (ref 5–15)
BUN: 25 mg/dL — AB (ref 6–20)
CHLORIDE: 101 mmol/L (ref 98–111)
CO2: 28 mmol/L (ref 22–32)
Calcium: 8.9 mg/dL (ref 8.9–10.3)
Creatinine, Ser: 0.86 mg/dL (ref 0.44–1.00)
GFR calc Af Amer: >60 mL/min (ref >60)
Glucose, Bld: 209 mg/dL — ABNORMAL HIGH (ref 70–99)
POTASSIUM: 4.1 mmol/L (ref 3.5–5.1)
Sodium: 138 mmol/L (ref 135–145)

## 2018-08-09 LAB — CBC
HEMATOCRIT: 31.1 % — AB (ref 36.0–46.0)
HEMOGLOBIN: 9.3 g/dL — AB (ref 12.0–15.0)
MCH: 27.6 pg (ref 26.0–34.0)
MCHC: 29.9 g/dL — ABNORMAL LOW (ref 30.0–36.0)
MCV: 92.3 fL (ref 80.0–100.0)
NRBC: 0.3 % — AB (ref 0.0–0.2)
PLATELETS: 326 10*3/uL (ref 150–400)
RBC: 3.37 MIL/uL — ABNORMAL LOW (ref 3.87–5.11)
RDW: 15.7 % — ABNORMAL HIGH (ref 11.5–15.5)
WBC: 6.3 10*3/uL (ref 4.0–10.5)

## 2018-08-09 LAB — GLUCOSE, CAPILLARY
Glucose-Capillary: 210 mg/dL — ABNORMAL HIGH (ref 70–99)
Glucose-Capillary: 192 mg/dL — ABNORMAL HIGH (ref 70–99)

## 2018-08-09 MED ORDER — ATORVASTATIN CALCIUM 40 MG PO TABS: 40.0000 mg | ORAL_TABLET | Freq: Every day | ORAL | 3 refills | Status: DC

## 2018-08-09 NOTE — Discharge Summary (Signed)
Physician Discharge Summary  [name] QQP:[id number] DOB: 1966-04-04 DOA: 08/03/2018  PCP: [name], NP  Admit date: 08/03/2018 Discharge date: 08/09/2018  Admitted From: Home  Disposition:  Home   Recommendations for Outpatient Follow-up:  1. Follow up with PCP in 1 week 2. Follow up with Cardiology in 2 weeks 3. Please obtain BMP in one week 4. Please confirm patient has resumed [place]: None  Equipment/Devices: None  Discharge Condition: Fair  CODE STATUS: FULL Diet recommendation: Diabetic  Brief/Interim Summary: [name] is a 52 y.o. F with Bipolar disorder, morbid obesity, Afib on Xarelto, recent LGIB, NIDDM, dCHF, pHTN and HTN who presented with chest pain at rest, found to have Afib with RVR, then troponin elevation up to 3.2 ng/ml.  Cardiology were consulted, started heparin, recommend heart cath.     PRINCIPAL HOSPITAL DIAGNOSIS: Atrial fibrillation with RVR, complicated by demand ischemia, contrast induced nephropathy, and acute on chronic diastolic CHF    Discharge Diagnoses:   Acute kidney injury Contrast-induced nephropathy Baseline Cr. 0.8, creatinine peaked >2, now resolved.  Losartan held at discharge.   Elevated troponin NSTEMI, ruled out Troponin has peaked at 3.2.  Started on heparin, LHC however with clean coronaries. The patient has some LV hypertrophy, Afib with RVR, troponin likely all demand supply mismatch.     Atrial fibrillation, chronic with RVR CHADS2Vasc 4.  On Xarelto.  This was supposed to be restarted after last hospitalization (for bleeding colonic polyps), but wasn't.  Cardioverted during left heart cath, was back in sinus rhythm, now back in atrial fibrillation.  Now back on Xarelto, carvedilol.    No clinical bleeding during this hospitalization.  Acute on chronic diastolic CHF Hypertension Pulmonary hypertension EF normal.  Endorses class IV symptoms at rest, mostly cannot walk more than 4  feet at baseline before getting out of breath.  Lasix held initially, patient developed pitting edema, severe orthopnea, received IV diuresis and improved.  Diabetes Patient noted in hospital to have frequent dietary indiscretions.     Bipolar disorder No active disease  Anemia of acute blood loss, recent LGIB Hgb stable, 8-10.  No concerns for recurrent bleeding during hospitalization  Morbid obesity Due to excess calories.  BMI >50 and HTN, DM               Discharge Instructions  Discharge Instructions    Diet Carb Modified   Complete by:  As directed    Discharge instructions   Complete by:  As directed    From Dr. Loleta Books: You were admitted for fast heart rate from your Afib. While you were here, we also found that you had some heart injury from this Afib. To make sure the heart injury wasn't from a heart attack, you had a "cardiac cath" to look at the arteries in your heart. This showed no blockages, in other words, NO heart attack, thankfully.  Unfortunately, due to the contrast media (the IV medicine they use in the cardiac cath), you had some contrast kidney injury.   Thankfully, this completely resolved by itself.    You should resume your home diabetes medicines. You should resume your home blood pressure medicines EXCEPT losartan/Cozaar and diltiazem/Cardizem (the losartan can harm your kidneys, the diltiazem is not good for someone with heart failure)  Take your blood thinner, Xarelto Start a new cholesterol medicine, atorvastatin/Lipitor nightly.   The Cardiology office will call you for an appointment with the heart specialist in 2 weeks.  Call your primary care doctor for a follow up appointment in 1 week, and have them check your lab work   Increase activity slowly   Complete by:  As directed      Allergies as of 08/09/2018      Reactions   Acetaminophen Other (See Comments)   Seizure-like "fits" as a child   Caffeine    Tense, anxiety,  increased urination   [place] [dapagliflozin] Other (See Comments)   Hallucinations, drop in blood sugar   Lisinopril Rash   Rash with lisinopril; but fosinopril is ok per patient      Medication List    STOP taking these medications   diltiazem 240 MG 24 hr capsule Commonly known as:  CARDIZEM CD   losartan 100 MG tablet Commonly known as:  COZAAR     TAKE these medications   albuterol 108 (90 Base) MCG/ACT inhaler Commonly known as:  PROVENTIL HFA;VENTOLIN HFA INHALE 1 TO 2 PUFFS EVERY 6 HOURS AS NEEDED FOR WHEEZING/ SHORTNESS OF BREATH   atorvastatin 40 MG tablet Commonly known as:  LIPITOR Take 1 tablet (40 mg total) by mouth daily at 6 PM.   carvedilol 25 MG tablet Commonly known as:  COREG TAKE 1 TABLET BY MOUTH 2 TIMES DAILY.   ergocalciferol 1.25 MG (50000 UT) capsule Commonly known as:  VITAMIN D2 Take 1 capsule (50,000 Units total) by mouth once a week.   ferrous sulfate 325 (65 FE) MG tablet Take 1 tablet (325 mg total) by mouth 2 (two) times daily.   furosemide 40 MG tablet Commonly known as:  LASIX Take 1 tablet (40 mg total) by mouth 2 (two) times daily.   glipiZIDE 10 MG 24 hr tablet Commonly known as:  GLUCOTROL XL Take 1 tablet (10 mg total) by mouth daily with breakfast.   glucose blood test strip Use as instructed. Check blood glucose levels by fingerstick twice per day   metFORMIN 1000 MG tablet Commonly known as:  GLUCOPHAGE Take 1 tablet by mouth 2 (two) times daily with a meal.   oxybutynin 15 MG 24 hr tablet Commonly known as:  DITROPAN XL Take 1 tablet (15 mg total) by mouth at bedtime.   polyethylene glycol packet Commonly known as:  MIRALAX / GLYCOLAX Take 17 g by mouth daily as needed for mild constipation.   potassium chloride SA 20 MEQ tablet Commonly known as:  K-DUR,KLOR-CON Take 1 tablet (20 mEq total) by mouth 2 (two) times daily.   risperiDONE 2 MG tablet Commonly known as:  RISPERDAL Take 1 tablet (2 mg total) by  mouth daily.   rivaroxaban 20 MG Tabs tablet Commonly known as:  XARELTO Take 1 tablet (20 mg total) by mouth daily with supper.      Follow-up Information    [place]. Schedule an appointment as soon as possible for a visit.   Why:  call on following morning after discharge for a hospital follow up appointment and to get a primary care physician Contact information: [place] [id number] [phone]       Lelon Perla, MD Follow up.   Specialty:  Cardiology Why:  Please keep appointment with Dr. [name] on 08/22/18 at 3:20 pm. Please arrive 15 minutes early for checkin.  Contact information: [address] [place] [address] [phone]          Allergies  Allergen Reactions  . Acetaminophen Other (See Comments)  Seizure-like "fits" as a child  . Caffeine     Tense, anxiety, increased urination  . [place] [Dapagliflozin] Other (See Comments)    Hallucinations, drop in blood sugar  . Lisinopril Rash    Rash with lisinopril; but fosinopril is ok per patient    Consultations:  Cardiology   Procedures/Studies: Dg Chest 2 View  Result Date: 08/03/2018 CLINICAL DATA:  Chest pain and shortness of breath beginning 8 hours ago. EXAM: CHEST - 2 VIEW COMPARISON:  04/13/2018 FINDINGS: Shallow inspiration. Cardiac enlargement. No vascular congestion, edema, or consolidation. No blunting of costophrenic angles. No pneumothorax. Mediastinal contours appear intact. Soft tissue attenuation limits examination. IMPRESSION: Cardiac enlargement. No evidence of active pulmonary disease. Electronically Signed   By: Lucienne Capers M.D.   On: 08/03/2018 03:29   Ct Angio Chest Pe W And/or Wo Contrast  Result Date: 08/03/2018 CLINICAL DATA:  Left chest pain, worse with palpation. EXAM: CT ANGIOGRAPHY CHEST WITH CONTRAST TECHNIQUE: Multidetector CT imaging of the chest was performed using the  standard protocol during bolus administration of intravenous contrast. Multiplanar CT image reconstructions and MIPs were obtained to evaluate the vascular anatomy. CONTRAST:  18mL ISOVUE-370 IOPAMIDOL (ISOVUE-370) INJECTION 76% COMPARISON:  12/23/2017 FINDINGS: Cardiovascular: Good opacification of the central and segmental pulmonary arteries. No focal filling defects. No evidence of significant pulmonary embolus. Cardiac enlargement. Normal caliber thoracic aorta. No pericardial effusions. Mediastinum/Nodes: Prominent mediastinal lymph nodes, similar to prior study. Likely reactive. Lungs/Pleura: Calcified granulomas. No airspace disease or consolidation. No pleural effusions. No pneumothorax. Upper Abdomen: No acute abnormalities demonstrated. Musculoskeletal: No chest wall abnormality. No acute or significant osseous findings. Review of the MIP images confirms the above findings. IMPRESSION: No evidence of significant pulmonary embolus. Cardiac enlargement. Lungs clear. Electronically Signed   By: Lucienne Capers M.D.   On: 08/03/2018 05:44   US Renal  Result Date: 08/06/2018 CLINICAL DATA:  Acute kidney injury. EXAM: RENAL / URINARY TRACT ULTRASOUND COMPLETE COMPARISON:  None. FINDINGS: Right Kidney: Renal measurements: 13.1 x 5.8 x 6.5 cm = volume: 253 mL. Question slightly increased echogenicity. No mass or hydronephrosis visualized. Left Kidney: Renal measurements: 11.6 x 6.2 x 6.5 cm = volume: 245 mL. Increased echogenicity in the left kidney without hydronephrosis. No suspicious renal mass. Bladder: Difficult to exclude bladder wall thickening or bladder wall irregularity. However, the bladder is incompletely distended. IMPRESSION: Negative for hydronephrosis. Question increased echogenicity in the kidneys, particularly in the left kidney. Limited evaluation of the bladder but cannot exclude bladder wall thickening or irregularity. Electronically Signed   By: Markus Daft M.D.   On: 08/06/2018 09:13        Subjective: Feeling her breathing is at baseline.  No orthopnea more than usual.  No leg swelling.  No chest pain.  No rectal bleeding, melena.  Discharge Exam: Vitals:   08/09/18 0602 08/09/18 0722  BP: (!) 177/96 (!) 159/97  Pulse: 67 66  Resp: 20   Temp: 98.8 F (37.1 C)   SpO2: 92%    Vitals:   08/08/18 1353 08/08/18 2037 08/09/18 0602 08/09/18 0722  BP:  (!) 168/79 (!) 177/96 (!) 159/97  Pulse: 71 73 67 66  Resp: 20 20 20    Temp:  98.7 F (37.1 C) 98.8 F (37.1 C)   TempSrc:  Oral Oral   SpO2: 96% 97% 92%   Weight:        General: Pt is alert, awake, not in acute distress Cardiovascular: RRR, nl S1-S2, no murmurs appreciated.   No  LE edema.   Respiratory: Normal respiratory rate and rhythm.  Wheezing at rest, no rales.    Abdominal: Abdomen soft and non-tender.  No distension or HSM.   Neuro/Psych: Strength symmetric in upper and lower extremities.  Judgment and insight appear normal.   The results of significant diagnostics from this hospitalization (including imaging, microbiology, ancillary and laboratory) are listed below for reference.     Microbiology: Recent Results (from the past 240 hour(s))  MRSA PCR Screening     Status: None   Collection Time: 08/03/18  7:18 AM  Result Value Ref Range Status   MRSA by PCR NEGATIVE NEGATIVE Final    Comment:        The GeneXpert MRSA Assay (FDA approved for NASAL specimens only), is one component of a comprehensive MRSA colonization surveillance program. It is not intended to diagnose MRSA infection nor to guide or monitor treatment for MRSA infections. Performed at [facility], [place] [address], [place], [place] [address]      Labs: BNP (last 3 results) Recent Labs    12/23/17 2005 08/03/18 0304 08/07/18 0529  BNP 285.1* 493.9* 673.4*   Basic Metabolic Panel: Recent Labs  Lab 08/03/18 1308  08/06/18 0654 08/06/18 1711 08/07/18 0529 08/08/18 0521 08/09/18 0558  NA   --    < > 126* 131* 131* 135 138  K  --    < > 4.6 4.4 4.2 4.5 4.1  CL  --    < > 95* 98 99 101 101  CO2  --    < > 21* 23 23 25 28   GLUCOSE  --    < > 320* 319* 417* 218* 209*  BUN  --    < > 38* 43* 44* 37* 25*  CREATININE  --    < > 2.37* 2.01* 1.84* 1.26* 0.86  CALCIUM  --    < > 8.7* 9.1 8.9 8.9 8.9  MG 1.6*  --   --   --   --   --   --    < > = values in this interval not displayed.   Liver Function Tests: No results for input(s): AST, ALT, ALKPHOS, BILITOT, PROT, ALBUMIN in the last 168 hours. No results for input(s): LIPASE, AMYLASE in the last 168 hours. No results for input(s): AMMONIA in the last 168 hours. CBC: Recent Labs  Lab 08/03/18 0247  08/05/18 0545 08/06/18 0449 08/07/18 0529 08/08/18 0521 08/09/18 0558  WBC 7.0   < > 5.8 7.3 6.1 6.0 6.3  NEUTROABS 5.9  --   --   --   --   --   --   HGB 10.2*   < > 8.8* 9.1* 8.6* 8.7* 9.3*  HCT 34.2*   < > 30.4* 30.7* 29.4* 29.0* 31.1*  MCV 91.0   < > 92.4 92.7 93.3 91.8 92.3  PLT 288   < > 257 307 289 298 326   < > = values in this interval not displayed.   Cardiac Enzymes: Recent Labs  Lab 08/03/18 1308 08/03/18 1549 08/03/18 2039 08/04/18 0400  TROPONINI 1.33* 2.14* 3.26* 2.70*   BNP: Invalid input(s): POCBNP CBG: Recent Labs  Lab 08/08/18 1124 08/08/18 1621 08/08/18 2039 08/09/18 0722 08/09/18 1134  GLUCAP 225* 228* 251* 210* 192*   D-Dimer No results for input(s): DDIMER in the last 72 hours. Hgb A1c No results for input(s): HGBA1C in the last 72 hours. Lipid Profile No results for input(s): CHOL, HDL, LDLCALC, TRIG,  CHOLHDL, LDLDIRECT in the last 72 hours. Thyroid function studies No results for input(s): TSH, T4TOTAL, T3FREE, THYROIDAB in the last 72 hours.  Invalid input(s): FREET3 Anemia work up No results for input(s): VITAMINB12, FOLATE, FERRITIN, TIBC, IRON, RETICCTPCT in the last 72 hours. Urinalysis    Component Value Date/Time   COLORURINE YELLOW 08/06/2018 0753   APPEARANCEUR  CLEAR 08/06/2018 0753   LABSPEC 1.018 08/06/2018 0753   PHURINE 5.0 08/06/2018 0753   GLUCOSEU 50 (A) 08/06/2018 0753   HGBUR MODERATE (A) 08/06/2018 0753   BILIRUBINUR NEGATIVE 08/06/2018 0753   BILIRUBINUR neg 09/10/2014 1219   KETONESUR NEGATIVE 08/06/2018 0753   PROTEINUR 100 (A) 08/06/2018 0753   UROBILINOGEN 1.0 09/10/2014 1219   UROBILINOGEN 0.2 12/07/2013 0041   NITRITE NEGATIVE 08/06/2018 0753   LEUKOCYTESUR NEGATIVE 08/06/2018 0753   Sepsis Labs Invalid input(s): PROCALCITONIN,  WBC,  LACTICIDVEN Microbiology Recent Results (from the past 240 hour(s))  MRSA PCR Screening     Status: None   Collection Time: 08/03/18  7:18 AM  Result Value Ref Range Status   MRSA by PCR NEGATIVE NEGATIVE Final    Comment:        The GeneXpert MRSA Assay (FDA approved for NASAL specimens only), is one component of a comprehensive MRSA colonization surveillance program. It is not intended to diagnose MRSA infection nor to guide or monitor treatment for MRSA infections. Performed at [facility], [place] [address] [place], [place] [address]      Time coordinating discharge: 40 minutes       SIGNED:   [name], MD  [facility] 08/09/2018, 10:06 PM

## 2018-08-10 ENCOUNTER — Telehealth: Payer: Self-pay

## 2018-08-10 NOTE — Telephone Encounter (Signed)
Transition Care Management Follow-up Telephone Call Date of discharge and from where: 08/09/2018, [facility]  Calls placed to # [phone] (M) and messages were left on both numbers requesting a call back to this CM # [phone].

## 2018-08-11 ENCOUNTER — Telehealth: Payer: Self-pay

## 2018-08-11 NOTE — Telephone Encounter (Signed)
Transition Care Management Follow-up Telephone Call Date of discharge and from where: 08/09/2018, [facility]  Calls placed to # [phone] (M) and messages were left on both numbers requesting a call back to this CM # [phone].

## 2018-08-12 ENCOUNTER — Inpatient Hospital Stay: Payer: Medicaid Other | Admitting: Nurse Practitioner

## 2018-08-13 ENCOUNTER — Ambulatory Visit (HOSPITAL_BASED_OUTPATIENT_CLINIC_OR_DEPARTMENT_OTHER): Payer: Medicaid Other | Attending: Emergency Medicine | Admitting: Pulmonary Disease

## 2018-08-13 VITALS — Ht 67.0 in | Wt 326.0 lb

## 2018-08-13 DIAGNOSIS — G4731 Primary central sleep apnea: Secondary | ICD-10-CM | POA: Diagnosis not present

## 2018-08-13 DIAGNOSIS — G4733 Obstructive sleep apnea (adult) (pediatric): Secondary | ICD-10-CM | POA: Diagnosis not present

## 2018-08-13 DIAGNOSIS — J9611 Chronic respiratory failure with hypoxia: Secondary | ICD-10-CM | POA: Insufficient documentation

## 2018-08-13 DIAGNOSIS — G4739 Other sleep apnea: Secondary | ICD-10-CM

## 2018-08-15 NOTE — Progress Notes (Signed)
HPI:  Follow-up chest pain,chronicdiastolic congestive heart failure, atrial fibrillation, obesity and hypertension. Also with history of SVT and atrial fibrillation. Echocardiogram July 2019 showed normal LV function, mild mitral regurgitation, mild biatrial enlargement and moderate tricuspid regurgitation.  Admitted with recurrent chest pain November 2019. Enzymes positive. Follow-up limited study November 2019 showed normal LV function and biatrial enlargement.  CTA showed no pulmonary embolus.  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  Ejection fraction 65%.  Left ventricular end-diastolic pressure 26 mmHg.  Procedure complicated by ventricular fibrillation requiring defibrillation.  This also resulted in reestablishing sinus rhythm as patient had been in atrial fibrillation previously. Developed contrast nephropathy following procedure.  Since last seen,she states her dyspnea has improved.  Occasional brief chest pain for 1 to 2 seconds but not sustained.  No palpitations or syncope.  Current Outpatient Medications  Medication Sig Dispense Refill  . albuterol (VENTOLIN HFA) 108 (90 Base) MCG/ACT inhaler INHALE 1 TO 2 PUFFS EVERY 6 HOURS AS NEEDED FOR WHEEZING/ SHORTNESS OF BREATH 18 g 1  . atorvastatin (LIPITOR) 40 MG tablet Take 1 tablet (40 mg total) by mouth daily at 6 PM. 30 tablet 3  . Blood Glucose Monitoring Suppl (TRUE METRIX METER) w/Device KIT Use as instructed. Check blood glucose levels by fingerstick twice per day 1 kit 0  . carvedilol (COREG) 25 MG tablet Take 1 tablet (25 mg total) by mouth 2 (two) times daily. 180 tablet 3  . ergocalciferol (DRISDOL) 50000 units capsule Take 1 capsule (50,000 Units total) by mouth once a week. 9 capsule 1  . FEROSUL 325 (65 Fe) MG tablet TAKE 1 TABLET BY MOUTH TWO TIMES DAILY 60 tablet 3  . furosemide  (LASIX) 40 MG tablet Take 1 tablet (40 mg total) by mouth 2 (two) times daily. 60 tablet 6  . glipiZIDE (GLIPIZIDE XL) 10 MG 24 hr tablet Take 1 tablet (10 mg total) by mouth daily with breakfast. 90 tablet 2  . glucose blood (TRUE METRIX BLOOD GLUCOSE TEST) test strip Use as instructed 100 each 12  . glucose blood test strip Use as instructed. Check blood glucose levels by fingerstick twice per day 100 each 12  . metFORMIN (GLUCOPHAGE) 1000 MG tablet Take 1 tablet (1,000 mg total) by mouth 2 (two) times daily with a meal. 180 tablet 3  . oxybutynin (DITROPAN XL) 15 MG 24 hr tablet Take 1 tablet (15 mg total) by mouth at bedtime. 90 tablet 3  . potassium chloride SA (K-DUR,KLOR-CON) 20 MEQ tablet Take 1 tablet (20 mEq total) by mouth 2 (two) times daily. 60 tablet 6  . risperiDONE (RISPERDAL) 2 MG tablet Take 1 tablet (2 mg total) by mouth daily. 30 tablet 1  . rivaroxaban (XARELTO) 20 MG TABS tablet Take 1 tablet (20 mg total) by mouth daily with supper. 30 tablet 0  . TRUEPLUS LANCETS 28G MISC Use as instructed. Check blood glucose levels by fingerstick twice per day 100 each 3   No current facility-administered medications for this visit.      Past Medical History:  Diagnosis Date  . Acute on chronic diastolic congestive heart failure ([place]) 11/02/2013   10/03/2015, 11/13/2015, 08/03/2017  . Benign essential HTN 11/28/2013  . Bipolar disease, chronic ([place])   . Chest pain    a. 2012 Myoview: EF 63%, no isch/infarct;  b. 04/2016 Lexiscan MV: EF 73%,  no ischemia/infarct-->Low risk.  . Chronic diastolic CHF (congestive heart failure) ([place]) 07/23/2011   a. 2015 Echo: EF 55-60%, Gr2 DD;  b. 09/2015 Echo: EF 60-65%, no rwma, mod dil LA, PASP 72mHg.  . Cor pulmonale (chronic) ([place]   . History of thyrotoxicosis   . HTN (hypertension) 11/28/2013  . Hypertensive heart disease 10/18/2013  . Hypoglycemia   . Insulin dependent type 2 diabetes mellitus, uncontrolled ([place]   . Mediastinal adenopathy   .  Morbid obesity due to excess calories ([place] 02/19/2011  . Morbid obesity with BMI of 50.0-59.9, adult ([place]   . OSA (obstructive sleep apnea) 03/06/2011  . Persistent atrial fibrillation 12/09/2017  . Pulmonary HTN, moderate to severe 11/03/2013  . Sinusitis, chronic 01/02/2015  . SVT (supraventricular tachycardia) ([place] 12/06/2013  . Uncontrolled type 2 diabetes mellitus with hyperglycemia ([facility]     Past Surgical History:  Procedure Laterality Date  . CARDIOVERSION N/A 04/05/2018   Procedure: CARDIOVERSION;  Surgeon: CLelon Perla MD;  [facility];  Service: Cardiovascular;  Laterality: N/A;  . COLONOSCOPY WITH PROPOFOL Left 07/16/2018   Procedure: COLONOSCOPY WITH PROPOFOL;  Surgeon: KRonnette Juniper MD;  [facility]COPY;  Service: Gastroenterology;  Laterality: Left;  . LEFT HEART CATH AND CORONARY ANGIOGRAPHY N/A 08/04/2018   Procedure: LEFT HEART CATH AND CORONARY ANGIOGRAPHY;  Surgeon: SBelva Crome MD;  [place] [facility];  Service: Cardiovascular;  Laterality: N/A;  . None    . POLYPECTOMY  07/16/2018   Procedure: POLYPECTOMY;  Surgeon: KRonnette Juniper MD;  [facility];  Service: Gastroenterology;;    Social History   Socioeconomic History  . Marital status: Single    Spouse name: Not on file  . Number of children: 0  . Years of education: 138 . Highest education level: Not on file  Occupational History  . Occupation: unemployed  Social Needs  . Financial resource strain: Not hard at all  . Food insecurity:    Worry: Never true    Inability: Never true  . Transportation needs:    Medical: No    Non-medical: No  Tobacco Use  . Smoking status: Never Smoker  . Smokeless tobacco: Never Used  Substance and Sexual Activity  . Alcohol use: No  . Drug use: No  . Sexual activity: Not Currently    Birth control/protection: None  Lifestyle  . Physical activity:    Days per week: Not on file    Minutes per session: Not on file  . Stress:  Only a little  Relationships  . Social connections:    Talks on phone: Not on file    Gets together: Not on file    Attends religious service: Not on file    Active member of club or organization: Not on file    Attends meetings of clubs or organizations: Not on file    Relationship status: Not on file  . Intimate partner violence:    Fear of current or ex partner: Not on file    Emotionally abused: Not on file    Physically abused: Not on file    Forced sexual activity: Not on file  Other Topics Concern  . 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 But ran out of money and is no longer attending school. (Note patient has bipolar disorder).  Born in [place] but lived in [place] before coming back to [place].       Primary language is Arabic. Lives with mother and brother.    Family History  Problem Relation Age of Onset  . Heart failure Father   . Stroke Father   . Hypertension Mother   . Heart disease Maternal Grandfather     ROS: no fevers or chills, productive cough, hemoptysis, dysphasia, odynophagia, melena, hematochezia, dysuria, hematuria, rash, seizure activity, orthopnea, PND, pedal edema, claudication. Remaining systems are negative.  Physical Exam: Well-developed morbidly obese in no acute distress.  Skin is warm and dry.  HEENT is normal.  Neck is supple.  Chest is clear to auscultation with normal expansion.  Cardiovascular exam is regular rate and rhythm.  Abdominal exam nontender or distended. No masses palpated. Extremities show no edema. neuro grossly intact  ECG-normal sinus rhythm at a rate of 72, cannot rule out prior septal infarct.  Personally reviewed  A/P  1 paroxysmal atrial fibrillation-patient remains in sinus rhythm today.  She had been in persistent atrial fibrillation but she developed ventricular fibrillation during her recent  catheterization and following cardioversion, sinus was restored.  Continue beta-blocker for rate control if atrial fibrillation recurs.  Continue Xarelto.  2 hypertension-blood pressure is elevated today.  Resume losartan 50 mg daily.  Check potassium and renal function in 1 week.  3 chronic diastolic congestive heart failure-patient appears to be euvolemic on examination.  Continue present dose of Lasix.  She needs fluid restriction to 1 L daily and low-sodium diet.  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  I previously discussed this with Dr. Tamala Julian and given that this is a right coronary artery we felt that medical therapy was best option.  5 chest pain-patient has had chronic chest pain.  She did not have obstructive coronary disease at time of catheterization.  We will not pursue further ischemia evaluation.  6 morbid obesity-we discussed the importance of weight loss.  [name], MD

## 2018-08-16 ENCOUNTER — Encounter: Payer: Self-pay | Admitting: Nurse Practitioner

## 2018-08-16 ENCOUNTER — Ambulatory Visit: Payer: Medicaid Other | Attending: Nurse Practitioner | Admitting: Nurse Practitioner

## 2018-08-16 ENCOUNTER — Other Ambulatory Visit: Payer: Self-pay | Admitting: Cardiovascular Disease

## 2018-08-16 ENCOUNTER — Other Ambulatory Visit: Payer: Self-pay | Admitting: Nurse Practitioner

## 2018-08-16 VITALS — BP 159/97 | HR 67 | Temp 97.6°F | Ht 67.0 in | Wt 331.0 lb

## 2018-08-16 DIAGNOSIS — F319 Bipolar disorder, unspecified: Secondary | ICD-10-CM | POA: Diagnosis not present

## 2018-08-16 DIAGNOSIS — G4733 Obstructive sleep apnea (adult) (pediatric): Secondary | ICD-10-CM | POA: Insufficient documentation

## 2018-08-16 DIAGNOSIS — Z79899 Other long term (current) drug therapy: Secondary | ICD-10-CM | POA: Diagnosis not present

## 2018-08-16 DIAGNOSIS — I110 Hypertensive heart disease with heart failure: Secondary | ICD-10-CM | POA: Diagnosis not present

## 2018-08-16 DIAGNOSIS — I4819 Other persistent atrial fibrillation: Secondary | ICD-10-CM | POA: Diagnosis not present

## 2018-08-16 DIAGNOSIS — I5032 Chronic diastolic (congestive) heart failure: Secondary | ICD-10-CM | POA: Diagnosis not present

## 2018-08-16 DIAGNOSIS — Z794 Long term (current) use of insulin: Secondary | ICD-10-CM | POA: Insufficient documentation

## 2018-08-16 DIAGNOSIS — I10 Essential (primary) hypertension: Secondary | ICD-10-CM | POA: Diagnosis not present

## 2018-08-16 DIAGNOSIS — R600 Localized edema: Secondary | ICD-10-CM

## 2018-08-16 DIAGNOSIS — Z888 Allergy status to other drugs, medicaments and biological substances status: Secondary | ICD-10-CM | POA: Diagnosis not present

## 2018-08-16 DIAGNOSIS — N393 Stress incontinence (female) (male): Secondary | ICD-10-CM | POA: Diagnosis not present

## 2018-08-16 DIAGNOSIS — Z8249 Family history of ischemic heart disease and other diseases of the circulatory system: Secondary | ICD-10-CM | POA: Diagnosis not present

## 2018-08-16 DIAGNOSIS — Z7901 Long term (current) use of anticoagulants: Secondary | ICD-10-CM | POA: Diagnosis not present

## 2018-08-16 DIAGNOSIS — E669 Obesity, unspecified: Secondary | ICD-10-CM

## 2018-08-16 DIAGNOSIS — E1165 Type 2 diabetes mellitus with hyperglycemia: Secondary | ICD-10-CM | POA: Insufficient documentation

## 2018-08-16 DIAGNOSIS — Z6841 Body Mass Index (BMI) 40.0 and over, adult: Secondary | ICD-10-CM | POA: Diagnosis not present

## 2018-08-16 DIAGNOSIS — F3162 Bipolar disorder, current episode mixed, moderate: Secondary | ICD-10-CM

## 2018-08-16 LAB — GLUCOSE, POCT (MANUAL RESULT ENTRY): POC GLUCOSE: 183 mg/dL — AB (ref 70–99)

## 2018-08-16 MED ORDER — CARVEDILOL 25 MG PO TABS: 25.0000 mg | ORAL_TABLET | Freq: Two times a day (BID) | ORAL | 3 refills | Status: DC

## 2018-08-16 MED ORDER — RIVAROXABAN 20 MG PO TABS: 20.0000 mg | ORAL_TABLET | Freq: Every day | ORAL | 0 refills | Status: DC

## 2018-08-16 MED ORDER — METFORMIN HCL 1000 MG PO TABS: 1000.0000 mg | ORAL_TABLET | Freq: Two times a day (BID) | ORAL | 3 refills | Status: DC

## 2018-08-16 MED ORDER — TRUE METRIX METER W/DEVICE KIT: PACK | 0 refills | Status: DC

## 2018-08-16 MED ORDER — GLUCOSE BLOOD VI STRP: ORAL_STRIP | 12 refills | Status: DC

## 2018-08-16 MED ORDER — GLIPIZIDE ER 10 MG PO TB24: 10.0000 mg | ORAL_TABLET | Freq: Every day | ORAL | 2 refills | Status: DC

## 2018-08-16 MED ORDER — OXYBUTYNIN CHLORIDE ER 15 MG PO TB24: 15.0000 mg | ORAL_TABLET | Freq: Every day | ORAL | 3 refills | Status: DC

## 2018-08-16 MED ORDER — TRUEPLUS LANCETS 28G MISC: 3 refills | Status: DC

## 2018-08-16 MED ORDER — ALBUTEROL SULFATE HFA 108 (90 BASE) MCG/ACT IN AERS: INHALATION_SPRAY | RESPIRATORY_TRACT | 1 refills | Status: DC

## 2018-08-16 MED ORDER — FUROSEMIDE 40 MG PO TABS: 40.0000 mg | ORAL_TABLET | Freq: Two times a day (BID) | ORAL | 6 refills | Status: DC

## 2018-08-16 MED FILL — XARELTO 20 MG TABLET: 20 | 30 days supply | Qty: 30 | Fill #5

## 2018-08-16 MED FILL — metFORMIN HCL 1000 MG TABS: 1000 | 30 days supply | Qty: 60 | Fill #1

## 2018-08-16 MED FILL — CARVEDILOL 25 MG TABLET: 25 | 30 days supply | Qty: 60 | Fill #4

## 2018-08-16 MED FILL — OXYBUTYNIN CL ER 15 MG TAB: 15 | 30 days supply | Qty: 30 | Fill #0

## 2018-08-16 MED FILL — glipiZIDE XL 10 MG TB24: 10 | 30 days supply | Qty: 30 | Fill #1

## 2018-08-16 MED FILL — POTASSIUM CL ER 20 MEQ TAB: 20 | 30 days supply | Qty: 60 | Fill #4

## 2018-08-16 MED FILL — FUROSEMIDE 40 MG TAB: 40 | 30 days supply | Qty: 60 | Fill #4

## 2018-08-16 NOTE — Progress Notes (Signed)
Assessment & Plan:  [name] was seen today for hospitalization follow-up.  Diagnoses and all orders for this visit:  Chronic diastolic heart failure (HCC) -     furosemide (LASIX) 40 MG tablet; Take 1 tablet (40 mg total) by mouth 2 (two) times daily. -     carvedilol (COREG) 25 MG tablet; Take 1 tablet (25 mg total) by mouth 2 (two) times daily.  Essential hypertension -     carvedilol (COREG) 25 MG tablet; Take 1 tablet (25 mg total) by mouth 2 (two) times daily. Continue all antihypertensives as prescribed.  Remember to bring in your blood pressure log with you for your follow up appointment.  DASH/Mediterranean Diets are healthier choices for HTN.   Stress incontinence in female -     Ambulatory referral to Urology -     oxybutynin (DITROPAN XL) 15 MG 24 hr tablet; Take 1 tablet (15 mg total) by mouth at bedtime.  Persistent atrial fibrillation RESOLVED CURRENTLY  -     rivaroxaban (XARELTO) 20 MG TABS tablet; Take 1 tablet (20 mg total) by mouth daily with supper.  Uncontrolled type 2 diabetes mellitus with hyperglycemia (HCC) -     Glucose (CBG) -     metFORMIN (GLUCOPHAGE) 1000 MG tablet; Take 1 tablet (1,000 mg total) by mouth 2 (two) times daily with a meal. -     glipiZIDE (GLIPIZIDE XL) 10 MG 24 hr tablet; Take 1 tablet (10 mg total) by mouth daily with breakfast. -     Blood Glucose Monitoring Suppl (TRUE METRIX METER) w/Device KIT; Use as instructed. Check blood glucose levels by fingerstick twice per day -     glucose blood (TRUE METRIX BLOOD GLUCOSE TEST) test strip; Use as instructed -     TRUEPLUS LANCETS 28G MISC; Use as instructed. Check blood glucose levels by fingerstick twice per day -     Referral to Nutrition and Diabetes Services Diabetes is poorly controlled. Advised patient to keep a fasting blood sugar log fast, 2 hours post lunch and bedtime which will be reviewed at the next office visit.  Continue blood sugar control as discussed in office today, low  carbohydrate diet, and regular physical exercise as tolerated, 150 minutes per week (30 min each day, 5 days per week, or 50 min 3 days per week). Keep blood sugar logs with fasting goal of 90-130 mg/dl, post prandial (after you eat) less than 180.  For Hypoglycemia: BS <60 and Hyperglycemia BS >400; contact the clinic ASAP. Annual eye exams and foot exams are recommended.  SHE HAS AN UPCOMING APPOINTMENT NEXT MONTH Lab Results  Component Value Date   HGBA1C 9.2 (A) 06/21/2018   Other orders -     albuterol (VENTOLIN HFA) 108 (90 Base) MCG/ACT inhaler; INHALE 1 TO 2 PUFFS EVERY 6 HOURS AS NEEDED FOR WHEEZING/ SHORTNESS OF BREATH    Patient has been counseled on age-appropriate routine health concerns for screening and prevention. These are reviewed and up-to-date. Referrals have been placed accordingly. Immunizations are up-to-date or declined.    Subjective:   Chief Complaint  Patient presents with  . Hospitalization Follow-up    Pt. is here for a hospital follow-up.    HPI [name] [age] y.o. female presents to office today for hospital follow up.  CHF/HTN She to the hospital on August 03, 2018 discharged on August 09, 2018.  Acute kidney injury related to contrast induced nephropathy, elevated troponin (for non-STEMI), A. fib with RVR and acute on chronic  diastolic CHF.  Per review of notes in a very short summarization: Patient was admitted for A. fib with RVR and elevated troponin.  She had a cardiac cath which was negative for an MI.  She developed contrast-induced nephropathy which resolved on its own prior to her discharge.  She was instructed to discontinue her losartan as well as diltiazem due to her acute kidney injury and heart failure.  She was also started on atorvastatin at that time.  She has scheduled cardiology next week. Today she endorses medication compliance taking carvedilol 25 mg twice daily, Lasix 40 mg twice daily and Xarelto 20 mg daily.  She does endorse  intermittent shortness of breath with minimal exertion as well as mild bilateral lower extremity edema.  She is not diet or exercise compliant.  Blood pressure is her endorsement of medication compliance however in the past she has a history of medication compliance as well.  So I am not sure how forthcoming she is today regarding her medication compliance.  As she has an upcoming appointment with cardiology within the next 7 to 10 days I will defer blood pressure management.  BP Readings from Last 3 Encounters:  08/22/18 (!) 158/104  08/16/18 (!) 159/97  08/09/18 (!) 159/97   Urinary Incontinence: Patient complains of urinary incontinence. This has been present for several months. She leaks urine with with urge, with a full bladder and with coughing or laughing. Patient describes the symptoms as  sensation of incomplete emptying of bladder, urge to urinate with little or no warning and urine leaking unpredictably.  Factors associated with symptoms include morbid obesity. Evaluation to date includes none.Treatment to date includes none.    Review of Systems  Constitutional: Negative for fever, malaise/fatigue and weight loss.  HENT: Negative.  Negative for nosebleeds.   Eyes: Negative.  Negative for blurred vision, double vision and photophobia.  Respiratory: Positive for shortness of breath. Negative for cough.   Cardiovascular: Positive for leg swelling. Negative for chest pain and palpitations.  Gastrointestinal: Negative.  Negative for heartburn, nausea and vomiting.  Genitourinary: Positive for urgency. Negative for dysuria, flank pain, frequency and hematuria.       SEE HPI  Musculoskeletal: Negative.  Negative for myalgias.  Neurological: Negative.  Negative for dizziness, focal weakness, seizures and headaches.  Psychiatric/Behavioral: Positive for depression. Negative for suicidal ideas.    Past Medical History:  Diagnosis Date  . Acute on chronic diastolic congestive heart failure  ([place]) 11/02/2013   10/03/2015, 11/13/2015, 08/03/2017  . Benign essential HTN 11/28/2013  . Bipolar disease, chronic ([place])   . Chest pain    a. 2012 Myoview: EF 63%, no isch/infarct;  b. 04/2016 Lexiscan MV: EF 73%, no ischemia/infarct-->Low risk.  . Chronic diastolic CHF (congestive heart failure) ([place]) 07/23/2011   a. 2015 Echo: EF 55-60%, Gr2 DD;  b. 09/2015 Echo: EF 60-65%, no rwma, mod dil LA, PASP 57mHg.  . Cor pulmonale (chronic) ([place]   . History of thyrotoxicosis   . HTN (hypertension) 11/28/2013  . Hypertensive heart disease 10/18/2013  . Hypoglycemia   . Insulin dependent type 2 diabetes mellitus, uncontrolled ([place]   . Mediastinal adenopathy   . Morbid obesity due to excess calories ([place] 02/19/2011  . Morbid obesity with BMI of 50.0-59.9, adult ([place]   . OSA (obstructive sleep apnea) 03/06/2011  . Persistent atrial fibrillation 12/09/2017  . Pulmonary HTN, moderate to severe 11/03/2013  . Sinusitis, chronic 01/02/2015  . SVT (supraventricular tachycardia) ([place] 12/06/2013  . Uncontrolled type  2 diabetes mellitus with hyperglycemia [facility])     Past Surgical History:  Procedure Laterality Date  . CARDIOVERSION N/A 04/05/2018   Procedure: CARDIOVERSION;  Surgeon: Lelon Perla, MD;  [facility];  Service: Cardiovascular;  Laterality: N/A;  . COLONOSCOPY WITH PROPOFOL Left 07/16/2018   Procedure: COLONOSCOPY WITH PROPOFOL;  Surgeon: Ronnette Juniper, MD;  [facility]COPY;  Service: Gastroenterology;  Laterality: Left;  . LEFT HEART CATH AND CORONARY ANGIOGRAPHY N/A 08/04/2018   Procedure: LEFT HEART CATH AND CORONARY ANGIOGRAPHY;  Surgeon: Belva Crome, MD;  Location: [place] CV LAB;  Service: Cardiovascular;  Laterality: N/A;  . None    . POLYPECTOMY  07/16/2018   Procedure: POLYPECTOMY;  Surgeon: Ronnette Juniper, MD;  [facility];  Service: Gastroenterology;;    Family History  Problem Relation Age of Onset  . Heart failure Father   . Stroke Father   .  Hypertension Mother   . Heart disease Maternal Grandfather     Social History Reviewed with no changes to be made today.   Outpatient Medications Prior to Visit  Medication Sig Dispense Refill  . atorvastatin (LIPITOR) 40 MG tablet Take 1 tablet (40 mg total) by mouth daily at 6 PM. 30 tablet 3  . ergocalciferol (DRISDOL) 50000 units capsule Take 1 capsule (50,000 Units total) by mouth once a week. 9 capsule 1  . glucose blood test strip Use as instructed. Check blood glucose levels by fingerstick twice per day 100 each 12  . potassium chloride SA (K-DUR,KLOR-CON) 20 MEQ tablet Take 1 tablet (20 mEq total) by mouth 2 (two) times daily. 60 tablet 6  . risperiDONE (RISPERDAL) 2 MG tablet Take 1 tablet (2 mg total) by mouth daily. 30 tablet 1  . albuterol (VENTOLIN HFA) 108 (90 Base) MCG/ACT inhaler INHALE 1 TO 2 PUFFS EVERY 6 HOURS AS NEEDED FOR WHEEZING/ SHORTNESS OF BREATH 18 g 1  . ferrous sulfate 325 (65 FE) MG tablet Take 1 tablet (325 mg total) by mouth 2 (two) times daily. 60 tablet 3  . furosemide (LASIX) 40 MG tablet Take 1 tablet (40 mg total) by mouth 2 (two) times daily. 60 tablet 6  . glipiZIDE (GLIPIZIDE XL) 10 MG 24 hr tablet Take 1 tablet (10 mg total) by mouth daily with breakfast. 90 tablet 2  . metFORMIN (GLUCOPHAGE) 1000 MG tablet Take 1 tablet by mouth 2 (two) times daily with a meal.  2  . oxybutynin (DITROPAN XL) 15 MG 24 hr tablet Take 1 tablet (15 mg total) by mouth at bedtime. 90 tablet 3  . rivaroxaban (XARELTO) 20 MG TABS tablet Take 1 tablet (20 mg total) by mouth daily with supper. 30 tablet 0  . carvedilol (COREG) 25 MG tablet TAKE 1 TABLET BY MOUTH 2 TIMES DAILY. (Patient not taking: Reported on 08/16/2018) 180 tablet 3  . polyethylene glycol (MIRALAX / GLYCOLAX) packet Take 17 g by mouth daily as needed for mild constipation. (Patient not taking: Reported on 08/16/2018) 14 each 0   No facility-administered medications prior to visit.     Allergies  Allergen  Reactions  . Acetaminophen Other (See Comments)    Seizure-like "fits" as a child  . Caffeine     Tense, anxiety, increased urination  . [place] [Dapagliflozin] Other (See Comments)    Hallucinations, drop in blood sugar  . Lisinopril Rash    Rash with lisinopril; but fosinopril is ok per patient       Objective:    BP Marland Kitchen)  159/97 (BP Location: Left Arm, Patient Position: Sitting, Cuff Size: Large)   Pulse 67   Temp 97.6 F (36.4 C) (Oral)   Ht '5\' 7"'  (1.702 m)   Wt (!) 331 lb (150.1 kg)   SpO2 93%   BMI 51.84 kg/m  Wt Readings from Last 3 Encounters:  08/22/18 (!) 327 lb (148.3 kg)  08/16/18 (!) 331 lb (150.1 kg)  08/13/18 (!) 326 lb (147.9 kg)    Physical Exam  Constitutional: She is oriented to person, place, and time. She appears well-developed and well-nourished. She is cooperative.  HENT:  Head: Normocephalic and atraumatic.  Eyes: EOM are normal.  Neck: Normal range of motion.  Cardiovascular: Normal rate, regular rhythm, normal heart sounds and intact distal pulses. Exam reveals no gallop and no friction rub.  No murmur heard. Pulmonary/Chest: Effort normal and breath sounds normal. No tachypnea. No respiratory distress. She has no decreased breath sounds. She has no wheezes. She has no rhonchi. She has no rales. She exhibits no tenderness.  Abdominal: Soft. Bowel sounds are normal.  Musculoskeletal: Normal range of motion. She exhibits edema (mild non pitting BLE edema).  Neurological: She is alert and oriented to person, place, and time. Coordination normal.  Skin: Skin is warm and dry.  Psychiatric: She has a normal mood and affect. Her behavior is normal. Judgment and thought content normal.  Nursing note and vitals reviewed.      Patient has been counseled extensively about nutrition and exercise as well as the importance of adherence with medications and regular follow-up. The patient was given clear instructions to go to ER or return to medical center if  symptoms don't improve, worsen or new problems develop. The patient verbalized understanding.   Follow-up: Return if symptoms worsen or fail to improve.   [name], FNP-BC [facility] and [facility] [place], [place]   08/22/2018, 5:43 PM

## 2018-08-17 MED FILL — PROAIR HFA 90 MCG INHALER: 108 (90 BAS | 25 days supply | Qty: 9 | Fill #0

## 2018-08-18 ENCOUNTER — Other Ambulatory Visit: Payer: Self-pay | Admitting: Nurse Practitioner

## 2018-08-18 DIAGNOSIS — F3162 Bipolar disorder, current episode mixed, moderate: Secondary | ICD-10-CM

## 2018-08-22 ENCOUNTER — Encounter: Payer: Self-pay | Admitting: Cardiology

## 2018-08-22 ENCOUNTER — Ambulatory Visit (INDEPENDENT_AMBULATORY_CARE_PROVIDER_SITE_OTHER): Payer: Medicaid Other | Admitting: Cardiology

## 2018-08-22 ENCOUNTER — Encounter: Payer: Self-pay | Admitting: Nurse Practitioner

## 2018-08-22 VITALS — BP 158/104 | HR 72 | Ht 67.0 in | Wt 327.0 lb

## 2018-08-22 DIAGNOSIS — I480 Paroxysmal atrial fibrillation: Secondary | ICD-10-CM

## 2018-08-22 DIAGNOSIS — R072 Precordial pain: Secondary | ICD-10-CM | POA: Diagnosis not present

## 2018-08-22 DIAGNOSIS — I10 Essential (primary) hypertension: Secondary | ICD-10-CM

## 2018-08-22 MED ORDER — LOSARTAN POTASSIUM 50 MG PO TABS: 50.0000 mg | ORAL_TABLET | Freq: Every day | ORAL | 3 refills | Status: DC

## 2018-08-22 NOTE — Patient Instructions (Signed)
Medication Instructions:  START LOSARTAN 50 MG ONCE DAILY If you need a refill on your cardiac medications before your next appointment, please call your pharmacy.   Lab work: Your physician recommends that you HAVE LAB WORK IN ONE WEEK If you have labs (blood work) drawn today and your tests are completely normal, you will receive your results only by: Marland Kitchen MyChart Message (if you have MyChart) OR . A paper copy in the mail If you have any lab test that is abnormal or we need to change your treatment, we will call you to review the results.  Follow-Up: Your physician recommends that you schedule a follow-up appointment in: [place]

## 2018-08-23 ENCOUNTER — Other Ambulatory Visit: Payer: Self-pay

## 2018-08-23 ENCOUNTER — Other Ambulatory Visit: Payer: Self-pay | Admitting: Nurse Practitioner

## 2018-08-23 DIAGNOSIS — F3162 Bipolar disorder, current episode mixed, moderate: Secondary | ICD-10-CM

## 2018-08-23 DIAGNOSIS — E1165 Type 2 diabetes mellitus with hyperglycemia: Secondary | ICD-10-CM

## 2018-08-23 MED ORDER — GLUCOSE BLOOD VI STRP: ORAL_STRIP | 12 refills | Status: DC

## 2018-08-23 MED ORDER — ACCU-CHEK AVIVA PLUS W/DEVICE KIT: 1.0000 | PACK | Freq: Every day | 0 refills | Status: DC

## 2018-08-23 MED ORDER — ACCU-CHEK SOFTCLIX LANCETS MISC: 12 refills | Status: DC

## 2018-08-23 MED FILL — ACCU-CHEK AVIVA PLUS STRP: 30 days supply | Qty: 100 | Fill #0

## 2018-08-23 MED FILL — ACCU-CHEK SOFTCLIX LANCETS: 30 days supply | Qty: 100 | Fill #0

## 2018-08-23 MED FILL — LOSARTAN POTASSIUM 50 MG TA: 50 | 30 days supply | Qty: 30 | Fill #0

## 2018-08-23 MED FILL — ACCU-CHEK AVIVA PLUS METER: W/DEVICE | 30 days supply | Qty: 1 | Fill #0

## 2018-08-31 DIAGNOSIS — G4733 Obstructive sleep apnea (adult) (pediatric): Secondary | ICD-10-CM | POA: Diagnosis not present

## 2018-08-31 NOTE — Procedures (Signed)
    Patient Name: Vanessa, [name] Date: 08/13/2018   Gender: Female  D.O.B: 07/17/1966  Age (years): 42  Referring Provider: Baltazar Apo  Height (inches): 27  Interpreting Physician: Chesley Mires MD, ABSM  Weight (lbs): 344  RPSGT: Jorge Ny  BMI: 54  MRN: [id number]  Neck Size: 17.00  <br> <br>  CLINICAL INFORMATION  The patient is referred for a split night study with BPAP. Most recent polysomnogram dated 11/27/2015 revealed an AHI of 90.5/h and RDI of 97.7/h. Most recent titration study dated 11/27/2015 was optimal at 19cm H2O with an AHI of 60.9/h.  MEDICATIONS  Medications self-administered by patient taken the night of the study : N/A SLEEP STUDY TECHNIQUE  As per the AASM Manual for the Scoring of Sleep and Associated Events v2.3 (April 2016) with a hypopnea requiring 4% desaturations. The channels recorded and monitored were frontal, central and occipital EEG, electrooculogram (EOG), submentalis EMG (chin), nasal and oral airflow, thoracic and abdominal wall motion, anterior tibialis EMG, snore microphone, electrocardiogram, and pulse oximetry. Bi-level positive airway pressure (BiPAP) was initiated when the patient met split night criteria and was titrated according to treat sleep-disordered breathing. RESPIRATORY PARAMETERS  Diagnostic Total AHI (/hr): 22.5 RDI (/hr): 36.9 OA Index (/hr): 4.4 CA Index (/hr): 0.4  REM AHI (/hr): 17.8 NREM AHI (/hr): 22.9 Supine AHI (/hr): N/A Non-supine AHI (/hr): 36.86  Min O2 Sat (%): 79.0 Mean O2 (%): 90.8 Time below 88% (min): 26.9      Titration Optimal IPAP Pressure (cm): 20 Optimal EPAP Pressure (cm): 16 AHI at Optimal Pressure (/hr): 11.2 Min O2 at Optimal Pressure (%): 88.0  Sleep % at Optimal (%): 94 Supine % at Optimal (%): 0          SLEEP ARCHITECTURE  The study was initiated at 10:30:49 PM and terminated at 6:19:32 AM. The total recorded time was 468.7 minutes. EEG confirmed total sleep time was 418 minutes  yielding a sleep efficiency of 89.2%%. Sleep onset after lights out was 9.4 minutes with a REM latency of 143.0 minutes. The patient spent 7.5%% of the night in stage N1 sleep, 66.1%% in stage N2 sleep, 0.0%% in stage N3 and 26.3% in REM. Wake after sleep onset (WASO) was 41.3 minutes. The Arousal Index was 23.4/hour. LEG MOVEMENT DATA  The total Periodic Limb Movements of Sleep (PLMS) were 0. The PLMS index was 0.0 . CARDIAC DATA  The 2 lead EKG demonstrated sinus rhythm. The mean heart rate was 100.0 beats per minute. Other EKG findings include: PVCs.  IMPRESSIONS  - Moderate obstructive sleep apnea with an AHI of 22.5 and SpO2 low of 79%. - She was tried on CPAP. She developed central apnea with oxygen desaturation. She failed titration with CPAP. - She was changed to Bipap. She did best with Bipap 20/16 cm H2O.  - She did not require the use of supplemental oxygen during this study. DIAGNOSIS  - Obstructive Sleep Apnea  - Treatment Emergent Central Sleep Apnea RECOMMENDATIONS  - Trial of BiPAP therapy on 20/16 cm H2O with a Medium size Resmed Full Face Mask AirFit 20 for Her mask and heated humidification. [Electronically signed] 08/31/2018 08:27 AM Chesley Mires MD, ABSM  Diplomate, American Board of Sleep Medicine  NPI: [id number]

## 2018-09-02 ENCOUNTER — Other Ambulatory Visit: Payer: Self-pay | Admitting: Nurse Practitioner

## 2018-09-02 DIAGNOSIS — F3162 Bipolar disorder, current episode mixed, moderate: Secondary | ICD-10-CM

## 2018-09-02 MED ORDER — RISPERIDONE 2 MG PO TABS: 2.0000 mg | ORAL_TABLET | Freq: Every day | ORAL | 0 refills | Status: DC

## 2018-09-02 MED FILL — risperiDONE 2 MG TABS: 2 | 30 days supply | Qty: 30 | Fill #0

## 2018-09-02 NOTE — Progress Notes (Signed)
Patient is again requesting risperidone.  She was instructed in August 2019 that I would courtesy fill a 60-day supply of risperidone and that she would need to follow-up with a local outpatient behavioral health facility such as [facility] to be evaluated and treated for her bipolar disorder.  Today her mother is here requesting a refill of risperidone for her and states that Jeyli does not want to be seen at an OP behavioral health clinic and they are requesting that I continue to prescribe her risperdal. I have instructed her that I do not prescribe antipsychotics and that Mana also needs psychotherapy for her bipoar disoder with psychosis. Previous meds include abilify, seroquel, klonopin and trazodone. She also has a history of anxiety and depression. Per notes dated 07-02-2014  While in [place], she was seen by a new psychiatrist who changed her bipolar medications to Risperdal and lorazepam.    Today I have sent in the final prescription for risperidone. She was instructed to make sure she follows up at [facility]. I will also refer her to a psychiatrist.

## 2018-09-13 ENCOUNTER — Encounter: Payer: Self-pay | Admitting: *Deleted

## 2018-09-16 ENCOUNTER — Other Ambulatory Visit: Payer: Self-pay

## 2018-09-16 ENCOUNTER — Ambulatory Visit: Payer: Medicaid Other | Attending: Nurse Practitioner | Admitting: Nurse Practitioner

## 2018-09-16 VITALS — BP 160/98 | HR 76 | Temp 98.0°F | Resp 16 | Wt 333.8 lb

## 2018-09-16 DIAGNOSIS — Z6841 Body Mass Index (BMI) 40.0 and over, adult: Secondary | ICD-10-CM | POA: Insufficient documentation

## 2018-09-16 DIAGNOSIS — Z7984 Long term (current) use of oral hypoglycemic drugs: Secondary | ICD-10-CM | POA: Diagnosis not present

## 2018-09-16 DIAGNOSIS — Z8249 Family history of ischemic heart disease and other diseases of the circulatory system: Secondary | ICD-10-CM | POA: Diagnosis not present

## 2018-09-16 DIAGNOSIS — Z79899 Other long term (current) drug therapy: Secondary | ICD-10-CM | POA: Diagnosis not present

## 2018-09-16 DIAGNOSIS — I10 Essential (primary) hypertension: Secondary | ICD-10-CM

## 2018-09-16 DIAGNOSIS — G4733 Obstructive sleep apnea (adult) (pediatric): Secondary | ICD-10-CM | POA: Diagnosis not present

## 2018-09-16 DIAGNOSIS — I5032 Chronic diastolic (congestive) heart failure: Secondary | ICD-10-CM | POA: Diagnosis not present

## 2018-09-16 DIAGNOSIS — F3162 Bipolar disorder, current episode mixed, moderate: Secondary | ICD-10-CM | POA: Diagnosis not present

## 2018-09-16 DIAGNOSIS — I110 Hypertensive heart disease with heart failure: Secondary | ICD-10-CM | POA: Diagnosis not present

## 2018-09-16 DIAGNOSIS — F419 Anxiety disorder, unspecified: Secondary | ICD-10-CM | POA: Diagnosis not present

## 2018-09-16 DIAGNOSIS — E1165 Type 2 diabetes mellitus with hyperglycemia: Secondary | ICD-10-CM | POA: Insufficient documentation

## 2018-09-16 DIAGNOSIS — Z7901 Long term (current) use of anticoagulants: Secondary | ICD-10-CM | POA: Diagnosis not present

## 2018-09-16 LAB — POCT GLYCOSYLATED HEMOGLOBIN (HGB A1C): HBA1C, POC (CONTROLLED DIABETIC RANGE): 7.1 % — AB (ref 0.0–7.0)

## 2018-09-16 LAB — GLUCOSE, POCT (MANUAL RESULT ENTRY): POC Glucose: 137 mg/dl — AB (ref 70–99)

## 2018-09-16 MED ORDER — LOSARTAN POTASSIUM 100 MG PO TABS: 100.0000 mg | ORAL_TABLET | Freq: Every day | ORAL | 3 refills | Status: DC

## 2018-09-16 MED ORDER — RISPERIDONE 4 MG PO TABS: 4.0000 mg | ORAL_TABLET | Freq: Every day | ORAL | 0 refills | Status: DC

## 2018-09-16 MED ORDER — MISC. DEVICES MISC: 0 refills | Status: DC

## 2018-09-16 MED FILL — ATORVASTATIN CALCIUM 40 MG: 40 | 30 days supply | Qty: 30 | Fill #0

## 2018-09-16 MED FILL — POTASSIUM CL ER 20 MEQ TAB: 20 | 30 days supply | Qty: 60 | Fill #5

## 2018-09-16 MED FILL — PROAIR HFA 90 MCG INHALER: 108 (90 BAS | 25 days supply | Qty: 9 | Fill #1

## 2018-09-16 MED FILL — OXYBUTYNIN CL ER 15 MG TAB: 15 | 30 days supply | Qty: 30 | Fill #1

## 2018-09-16 MED FILL — glipiZIDE XL 10 MG TB24: 10 | 90 days supply | Qty: 90 | Fill #0

## 2018-09-16 MED FILL — FUROSEMIDE 40 MG TAB: 40 | 30 days supply | Qty: 60 | Fill #0

## 2018-09-16 MED FILL — XARELTO 20 MG TABLET: 20 | 30 days supply | Qty: 30 | Fill #6

## 2018-09-16 MED FILL — CARVEDILOL 25 MG TABLET: 25 | 30 days supply | Qty: 60 | Fill #5

## 2018-09-16 NOTE — Progress Notes (Signed)
Pt states

## 2018-09-16 NOTE — Progress Notes (Signed)
Assessment & Plan:  [name] was seen today for diabetes and hypertension.  Diagnoses and all orders for this visit:  Uncontrolled type 2 diabetes mellitus with hyperglycemia ([name]) -     Cancel: HgB A1c -     Glucose (CBG) -     Microalbumin / creatinine urine ratio -     POCT glycosylated hemoglobin (Hb A1C) Continue blood sugar control as discussed in office today, low carbohydrate diet, and regular physical exercise as tolerated, 150 minutes per week (30 min each day, 5 days per week, or 50 min 3 days per week). Keep blood sugar logs with fasting goal of 90-130 mg/dl, post prandial (after you eat) less than 180.  For Hypoglycemia: BS <60 and Hyperglycemia BS >400; contact the clinic ASAP. Annual eye exams and foot exams are recommended.   Essential hypertension -     losartan (COZAAR) 100 MG tablet; Take 1 tablet (100 mg total) by mouth daily. Continue all antihypertensives as prescribed.  Remember to bring in your blood pressure log with you for your follow up appointment.  DASH/Mediterranean Diets are healthier choices for HTN.    OSA treated with BiPAP -     Misc. Devices MISC; Please provide patient with: BiPAP therapy on 20/16 cm H2O Medium size Resmed Full Face Mask AirFit 20 for mask and heated humidification  Bipolar 1 disorder, mixed, moderate (HCC) -     risperiDONE (RISPERDAL) 4 MG tablet; Take 1 tablet (4 mg total) by mouth daily.    Patient has been counseled on age-appropriate routine health concerns for screening and prevention. These are reviewed and up-to-date. Referrals have been placed accordingly. Immunizations are up-to-date or declined.    Subjective:   Chief Complaint  Patient presents with  . Diabetes  . Hypertension   HPI [name] [age] y.o. female presents to office today for follow up to DM and HTN. She is requesting a refill of risperdal. She has a long history of bipolar disorder, depression and anxiety. States risperdal seems to control her  mood lability.   Diabetes Mellitus Type 2 Chronic and improved. She denies any hypo or hyperglycemic symptoms. She does not monitor her blood glucose levels daily. States she monitors her diet in regards to controlling her diabetes. Current medications include glipizide 10 mg daily, metformin 1000 mg BID. She endorses medication compliance.  Lab Results  Component Value Date   HGBA1C 7.1 (A) 09/16/2018   Essential Hypertension Blood pressure is elevated today. She endorses medication compliance. Will increase losartan to 100 mg daily. She also takes carvedilol 25 mg BID and lasix 40 mg BID. Vanessa KitchenShe endorses increased shortness of breath and O2 sats are borderline. I recommended she be evaluated in the ED however she refused. She has Diastolic CHF and is not weighing herself every day. Her weight is up 6 lbs since 3 weeks ago. She is not wearing her BIPAP at night. She currently denies chest pain, lightheadedness, dizziness or increased BLE edema. BP Readings from Last 3 Encounters:  09/16/18 (!) 160/98  08/22/18 (!) 158/104  08/16/18 (!) 159/97    Review of Systems  Constitutional: Negative for fever, malaise/fatigue and weight loss.  HENT: Negative.  Negative for nosebleeds.   Eyes: Negative.  Negative for blurred vision, double vision and photophobia.  Respiratory: Positive for shortness of breath. Negative for cough.   Cardiovascular: Positive for orthopnea. Negative for chest pain, palpitations and leg swelling.  Gastrointestinal: Negative.  Negative for heartburn, nausea and vomiting.  Musculoskeletal: Negative.  Negative for myalgias.  Neurological: Negative.  Negative for dizziness, focal weakness, seizures and headaches.  Psychiatric/Behavioral: Negative.  Negative for suicidal ideas.    Past Medical History:  Diagnosis Date  . Acute on chronic diastolic congestive heart failure ([name]) 11/02/2013   10/03/2015, 11/13/2015, 08/03/2017  . Benign essential HTN 11/28/2013  . Bipolar  disease, chronic ([name])   . Chest pain    a. 2012 Myoview: EF 63%, no isch/infarct;  b. 04/2016 Lexiscan MV: EF 73%, no ischemia/infarct-->Low risk.  . Chronic diastolic CHF (congestive heart failure) ([name]) 07/23/2011   a. 2015 Echo: EF 55-60%, Gr2 DD;  b. 09/2015 Echo: EF 60-65%, no rwma, mod dil LA, PASP 73mHg.  . Cor pulmonale (chronic) ([name]   . History of thyrotoxicosis   . HTN (hypertension) 11/28/2013  . Hypertensive heart disease 10/18/2013  . Hypoglycemia   . Insulin dependent type 2 diabetes mellitus, uncontrolled ([name]   . Mediastinal adenopathy   . Morbid [place] due to excess calories ([facility] 02/19/2011  . Morbid obesity with BMI of 50.0-59.9, adult ([name]   . OSA (obstructive sleep apnea) 03/06/2011  . Persistent atrial fibrillation 12/09/2017  . Pulmonary HTN, moderate to severe 11/03/2013  . Sinusitis, chronic 01/02/2015  . SVT (supraventricular tachycardia) ([name] 12/06/2013  . Uncontrolled type 2 diabetes mellitus with hyperglycemia ([name]     Past Vanessa History:  Procedure Laterality Date  . CARDIOVERSION N/A 04/05/2018   Procedure: CARDIOVERSION;  Surgeon: CLelon Perla MD;  [facility];  Service: Cardiovascular;  Laterality: N/A;  . COLONOSCOPY WITH PROPOFOL Left 07/16/2018   Procedure: COLONOSCOPY WITH PROPOFOL;  Surgeon: KRonnette Juniper MD;  Location: [facility] ENDOSCOPY;  Service: Gastroenterology;  Laterality: Left;  . LEFT HEART CATH AND CORONARY ANGIOGRAPHY N/A 08/04/2018   Procedure: LEFT HEART CATH AND CORONARY ANGIOGRAPHY;  Surgeon: SBelva Crome MD;  [facility];  Service: Cardiovascular;  Laterality: N/A;  . None    . POLYPECTOMY  07/16/2018   Procedure: POLYPECTOMY;  Surgeon: KRonnette Juniper MD;  [facility];  Service: Gastroenterology;;    Family History  Problem Relation Age of Onset  . Heart failure Father   . Stroke Father   . Hypertension Mother   . Heart disease Maternal Grandfather     Social History Reviewed with no  changes to be made today.   Outpatient Medications Prior to Visit  Medication Sig Dispense Refill  . ACCU-CHEK SOFTCLIX LANCETS lancets USE AS DIRECTED TO TEST BLOOD SUGAR ONCE DAILY 100 each 12  . albuterol (VENTOLIN HFA) 108 (90 Base) MCG/ACT inhaler INHALE 1 TO 2 PUFFS EVERY 6 HOURS AS NEEDED FOR WHEEZING/ SHORTNESS OF BREATH 18 g 1  . atorvastatin (LIPITOR) 40 MG tablet Take 1 tablet (40 mg total) by mouth daily at 6 PM. 30 tablet 3  . Blood Glucose Monitoring Suppl (ACCU-CHEK AVIVA PLUS) w/Device KIT 1 each by Does not apply route daily. USE AS DIRECTED TO TEST BLOOD SUGAR ONCE DAILY 1 kit 0  . Blood Glucose Monitoring Suppl (TRUE METRIX METER) w/Device KIT Use as instructed. Check blood glucose levels by fingerstick twice per day 1 kit 0  . carvedilol (COREG) 25 MG tablet Take 1 tablet (25 mg total) by mouth 2 (two) times daily. 180 tablet 3  . ergocalciferol (DRISDOL) 50000 units capsule Take 1 capsule (50,000 Units total) by mouth once a week. 9 capsule 1  . FEROSUL 325 (65 Fe) MG tablet TAKE 1 TABLET BY MOUTH TWO TIMES DAILY 60 tablet 3  .  furosemide (LASIX) 40 MG tablet Take 1 tablet (40 mg total) by mouth 2 (two) times daily. 60 tablet 6  . glipiZIDE (GLIPIZIDE XL) 10 MG 24 hr tablet Take 1 tablet (10 mg total) by mouth daily with breakfast. 90 tablet 2  . glucose blood (ACCU-CHEK AVIVA PLUS) test strip USE AS DIRECTED TO TEST BLOOD SUGAR ONCE DAILY 100 each 12  . metFORMIN (GLUCOPHAGE) 1000 MG tablet Take 1 tablet (1,000 mg total) by mouth 2 (two) times daily with a meal. 180 tablet 3  . oxybutynin (DITROPAN XL) 15 MG 24 hr tablet Take 1 tablet (15 mg total) by mouth at bedtime. 90 tablet 3  . potassium chloride SA (K-DUR,KLOR-CON) 20 MEQ tablet Take 1 tablet (20 mEq total) by mouth 2 (two) times daily. 60 tablet 6  . rivaroxaban (XARELTO) 20 MG TABS tablet Take 1 tablet (20 mg total) by mouth daily with supper. 30 tablet 0  . TRUEPLUS LANCETS 28G MISC Use as instructed. Check blood  glucose levels by fingerstick twice per day 100 each 3  . losartan (COZAAR) 50 MG tablet Take 1 tablet (50 mg total) by mouth daily. 90 tablet 3  . risperiDONE (RISPERDAL) 2 MG tablet Take 1 tablet (2 mg total) by mouth daily. 30 tablet 0   No facility-administered medications prior to visit.     Allergies  Allergen Reactions  . Acetaminophen Other (See Comments)    Seizure-like "fits" as a child  . Caffeine     Tense, anxiety, increased urination  . [place] [Dapagliflozin] Other (See Comments)    Hallucinations, drop in blood sugar  . Lisinopril Rash    Rash with lisinopril; but fosinopril is ok per patient   BP Readings from Last 3 Encounters:  09/16/18 (!) 160/98  08/22/18 (!) 158/104  08/16/18 (!) 159/97      Objective:    BP (!) 160/98   Pulse 76   Temp 98 F (36.7 C) (Oral)   Resp 16   Wt (!) 333 lb 12.8 oz (151.4 kg)   SpO2 92%   BMI 52.28 kg/m  Wt Readings from Last 3 Encounters:  09/16/18 (!) 333 lb 12.8 oz (151.4 kg)  08/22/18 (!) 327 lb (148.3 kg)  08/16/18 (!) 331 lb (150.1 kg)   Physical Exam Vitals signs and nursing note reviewed.  Constitutional:      Appearance: She is well-developed.  HENT:     Head: Normocephalic and atraumatic.  Neck:     Musculoskeletal: Normal range of motion.  Cardiovascular:     Rate and Rhythm: Normal rate and regular rhythm.     Heart sounds: Normal heart sounds. No murmur. No friction rub. No gallop.   Pulmonary:     Effort: Tachypnea present. No respiratory distress.     Breath sounds: Normal breath sounds. No decreased breath sounds, wheezing, rhonchi or rales.     Comments: Increased work of breathing with standing from exam table and walking to chair in exam work Chest:     Chest wall: No tenderness.  Abdominal:     General: Bowel sounds are normal.     Palpations: Abdomen is soft.  Musculoskeletal: Normal range of motion.  Skin:    General: Skin is warm and dry.  Neurological:     Mental Status: She is  alert and oriented to person, place, and time.     Coordination: Coordination normal.  Psychiatric:        Behavior: Behavior normal. Behavior is cooperative.  Thought Content: Thought content normal.        Judgment: Judgment normal.          Patient has been counseled extensively about nutrition and exercise as well as the importance of adherence with medications and regular follow-up. The patient was given clear instructions to go to ER or return to medical center if symptoms don't improve, worsen or new problems develop. The patient verbalized understanding.   Follow-up: No follow-ups on file.   [name], FNP-BC [facility] and [place], [place]   09/18/2018, 7:27 PM

## 2018-09-17 LAB — MICROALBUMIN / CREATININE URINE RATIO
CREATININE, UR: 137.1 mg/dL
MICROALBUM., U, RANDOM: 2688.1 ug/mL
Microalb/Creat Ratio: 1960.7 mg/g creat — ABNORMAL HIGH (ref 0.0–30.0)

## 2018-09-18 ENCOUNTER — Encounter: Payer: Self-pay | Admitting: Nurse Practitioner

## 2018-09-19 ENCOUNTER — Telehealth: Payer: Self-pay

## 2018-09-19 DIAGNOSIS — I4819 Other persistent atrial fibrillation: Secondary | ICD-10-CM | POA: Diagnosis not present

## 2018-09-19 NOTE — Telephone Encounter (Signed)
Contacted pt to go over urine results pt didn't answer lvm asking pt to give me a call at her earliest convenience

## 2018-09-19 NOTE — Progress Notes (Signed)
Please complete patient note after relaying results

## 2018-09-20 LAB — BASIC METABOLIC PANEL
BUN / CREAT RATIO: 16 (ref 9–23)
BUN: 15 mg/dL (ref 6–24)
CHLORIDE: 102 mmol/L (ref 96–106)
CO2: 21 mmol/L (ref 20–29)
CREATININE: 0.95 mg/dL (ref 0.57–1.00)
Calcium: 9 mg/dL (ref 8.7–10.2)
GFR calc Af Amer: 81 mL/min/{1.73_m2} (ref >59)
GFR calc non Af Amer: 70 mL/min/{1.73_m2} (ref >59)
GLUCOSE: 170 mg/dL — AB (ref 65–99)
POTASSIUM: 4.2 mmol/L (ref 3.5–5.2)
SODIUM: 138 mmol/L (ref 134–144)

## 2018-09-22 ENCOUNTER — Encounter: Payer: Self-pay | Admitting: *Deleted

## 2018-10-04 ENCOUNTER — Ambulatory Visit (INDEPENDENT_AMBULATORY_CARE_PROVIDER_SITE_OTHER): Payer: Medicaid Other | Admitting: Cardiology

## 2018-10-04 VITALS — BP 160/80 | HR 80 | Ht 67.0 in | Wt 341.8 lb

## 2018-10-04 DIAGNOSIS — I10 Essential (primary) hypertension: Secondary | ICD-10-CM | POA: Diagnosis not present

## 2018-10-04 DIAGNOSIS — I5032 Chronic diastolic (congestive) heart failure: Secondary | ICD-10-CM

## 2018-10-04 MED ORDER — FERROUS SULFATE 325 (65 FE) MG PO TABS: 325.0000 mg | ORAL_TABLET | Freq: Two times a day (BID) | ORAL | 3 refills | Status: DC

## 2018-10-04 MED ORDER — LOSARTAN POTASSIUM 100 MG PO TABS: 100.0000 mg | ORAL_TABLET | Freq: Every day | ORAL | 3 refills | Status: DC

## 2018-10-04 MED FILL — LOSARTAN POTASSIUM 100 MG T: 100 | 30 days supply | Qty: 30 | Fill #0

## 2018-10-04 NOTE — Patient Instructions (Signed)
Medication Instructions:  START  Lasix 80 Mg in the mornings and 40 Mg in the evenings continue until you follow up with Kerin Ransom, PA-C  If you need a refill on your cardiac medications before your next appointment, please call your pharmacy.   Lab work:  NONE  If you have labs (blood work) drawn today and your tests are completely normal, you will receive your results only by: Marland Kitchen MyChart Message (if you have MyChart) OR . A paper copy in the mail If you have any lab test that is abnormal or we need to change your treatment, we will call you to review the results.  Testing/Procedures: NONE  Follow-Up: Your physician recommends that you schedule a follow-up appointment in  1 week with Kerin Ransom, PA-C

## 2018-10-04 NOTE — Progress Notes (Signed)
10/04/2018 [name] UXNATFT   1966-05-18  [id number]  Primary Physician [name], NP Primary Cardiologist: Dr [name]- [facility]  HPI:  The patient is a 53 y.o. female from [place] with a hx of D-CHF,PAF w/ failed DCCV 02/2018 & 04/05/2018, HTN,morbid obesity,MV 2017 w/ nl perfusionand EF 73%, SVT, chronic intermittent chest pain,CHA2DS2-VASc =3 (female, CHF, HTN) on Xarelto,chronic anemia. OSAnot on CPAP but she has had her sleep study, and prior Mycobacterium avium infection. She was admitted with chest pain and diastolic CHF Nov 7062.  Cath revealed normal coronaries with elevated LV pressures.  In the cath lab she had VF and was shocked.  She has maintained NSR since. She did have transient AKI post cath. She was seen in the office 08/22/18. Her weight then was 327 lbs.   She is in the office today with complaints of increased dyspnea. Her weight today is 341 lbs. She has been compliant with her medications.  She had her sleep study and has sleep apnea but is not oin C-pap yet. EKG shows her in NSR.    Current Outpatient Medications  Medication Sig Dispense Refill  . ACCU-CHEK SOFTCLIX LANCETS lancets USE AS DIRECTED TO TEST BLOOD SUGAR ONCE DAILY 100 each 12  . albuterol (VENTOLIN HFA) 108 (90 Base) MCG/ACT inhaler INHALE 1 TO 2 PUFFS EVERY 6 HOURS AS NEEDED FOR WHEEZING/ SHORTNESS OF BREATH 18 g 1  . atorvastatin (LIPITOR) 40 MG tablet Take 1 tablet (40 mg total) by mouth daily at 6 PM. 30 tablet 3  . Blood Glucose Monitoring Suppl (ACCU-CHEK AVIVA PLUS) w/Device KIT 1 each by Does not apply route daily. USE AS DIRECTED TO TEST BLOOD SUGAR ONCE DAILY 1 kit 0  . Blood Glucose Monitoring Suppl (TRUE METRIX METER) w/Device KIT Use as instructed. Check blood glucose levels by fingerstick twice per day 1 kit 0  . ergocalciferol (DRISDOL) 50000 units capsule Take 1 capsule (50,000 Units total) by mouth once a week. 9 capsule 1  . FEROSUL 325 (65 Fe) MG tablet TAKE 1 TABLET BY MOUTH  TWO TIMES DAILY 60 tablet 3  . furosemide (LASIX) 40 MG tablet Take 1 tablet (40 mg total) by mouth 2 (two) times daily. 60 tablet 6  . glipiZIDE (GLIPIZIDE XL) 10 MG 24 hr tablet Take 1 tablet (10 mg total) by mouth daily with breakfast. 90 tablet 2  . glucose blood (ACCU-CHEK AVIVA PLUS) test strip USE AS DIRECTED TO TEST BLOOD SUGAR ONCE DAILY 100 each 12  . losartan (COZAAR) 100 MG tablet Take 1 tablet (100 mg total) by mouth daily. 90 tablet 3  . metFORMIN (GLUCOPHAGE) 1000 MG tablet Take 1 tablet (1,000 mg total) by mouth 2 (two) times daily with a meal. 180 tablet 3  . Misc. Devices MISC Please provide patient with: BiPAP therapy on 20/16 cm H2O Medium size Resmed Full Face Mask AirFit 20 for mask and heated humidification 1 each 0  . oxybutynin (DITROPAN XL) 15 MG 24 hr tablet Take 1 tablet (15 mg total) by mouth at bedtime. 90 tablet 3  . potassium chloride SA (K-DUR,KLOR-CON) 20 MEQ tablet Take 1 tablet (20 mEq total) by mouth 2 (two) times daily. 60 tablet 6  . risperiDONE (RISPERDAL) 4 MG tablet Take 1 tablet (4 mg total) by mouth daily. 30 tablet 0  . TRUEPLUS LANCETS 28G MISC Use as instructed. Check blood glucose levels by fingerstick twice per day 100 each 3  . carvedilol (COREG) 25 MG tablet Take 1  tablet (25 mg total) by mouth 2 (two) times daily. 180 tablet 3  . rivaroxaban (XARELTO) 20 MG TABS tablet Take 1 tablet (20 mg total) by mouth daily with supper. 30 tablet 0   No current facility-administered medications for this visit.     Allergies  Allergen Reactions  . Acetaminophen Other (See Comments)    Seizure-like "fits" as a child  . Caffeine     Tense, anxiety, increased urination  . [place] [Dapagliflozin] Other (See Comments)    Hallucinations, drop in blood sugar  . Lisinopril Rash    Rash with lisinopril; but fosinopril is ok per patient    Past Medical History:  Diagnosis Date  . Acute on chronic diastolic congestive heart failure ([place]) 11/02/2013    10/03/2015, 11/13/2015, 08/03/2017  . Benign essential HTN 11/28/2013  . Bipolar disease, chronic ([place])   . Chest pain    a. 2012 Myoview: EF 63%, no isch/infarct;  b. 04/2016 Lexiscan MV: EF 73%, no ischemia/infarct-->Low risk.  . Chronic diastolic CHF (congestive heart failure) ([place]) 07/23/2011   a. 2015 Echo: EF 55-60%, Gr2 DD;  b. 09/2015 Echo: EF 60-65%, no rwma, mod dil LA, PASP 34mHg.  . Cor pulmonale (chronic) ([facility]   . History of thyrotoxicosis   . HTN (hypertension) 11/28/2013  . Hypertensive heart disease 10/18/2013  . Hypoglycemia   . Insulin dependent type 2 diabetes mellitus, uncontrolled ([place]   . Mediastinal adenopathy   . Morbid obesity due to excess calories ([place] 02/19/2011  . Morbid obesity with BMI of 50.0-59.9, adult ([place]   . OSA (obstructive sleep apnea) 03/06/2011  . Persistent atrial fibrillation 12/09/2017  . Pulmonary HTN, moderate to severe 11/03/2013  . Sinusitis, chronic 01/02/2015  . SVT (supraventricular tachycardia) ([facility] 12/06/2013  . Uncontrolled type 2 diabetes mellitus with hyperglycemia (H[facility])     Social History   Socioeconomic History  . Marital status: Single    Spouse name: Not on file  . Number of children: 0  . Years of education: 13 . Highest education level: Not on file  Occupational History  . Occupation: unemployed  Social Needs  . Financial resource strain: Not hard at all  . Food insecurity:    Worry: Never true    Inability: Never true  . Transportation needs:    Medical: No    Non-medical: No  Tobacco Use  . Smoking status: Never Smoker  . Smokeless tobacco: Never Used  Substance and Sexual Activity  . Alcohol use: No  . Drug use: No  . Sexual activity: Not Currently    Birth control/protection: None  Lifestyle  . Physical activity:    Days per week: Not on file    Minutes per session: Not on file  . Stress: Only a little  Relationships  . Social connections:    Talks on phone: Not on file    Gets together: Not on file     Attends religious service: Not on file    Active member of club or organization: Not on file    Attends meetings of clubs or organizations: Not on file    Relationship status: Not on file  . Intimate partner violence:    Fear of current or ex partner: Not on file    Emotionally abused: Not on file    Physically abused: Not on file    Forced sexual activity: Not on file  Other Topics Concern  . 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 But ran out of money and is no longer attending school. (Note patient has bipolar disorder).      Born in [place] but lived in [place] before coming back to [place].       Primary language is Arabic. Lives with mother and brother.     Family History  Problem Relation Age of Onset  . Heart failure Father   . Stroke Father   . Hypertension Mother   . Heart disease Maternal Grandfather      Review of Systems: General: negative for chills, fever, night sweats or weight changes.  Cardiovascular: negative for chest pain, dyspnea on exertion, edema, orthopnea, palpitations, paroxysmal nocturnal dyspnea or shortness of breath Dermatological: negative for rash Respiratory: negative for cough or wheezing Urologic: negative for hematuria Abdominal: negative for nausea, vomiting, diarrhea, bright red blood per rectum, melena, or hematemesis Neurologic: negative for visual changes, syncope, or dizziness All other systems reviewed and are otherwise negative except as noted above.    Blood pressure (!) 160/80, pulse 80, height '5\' 7"'  (1.702 m), weight (!) 341 lb 12.8 oz (155 kg).  General appearance: alert, cooperative, no distress and morbidly obese Lungs: decreased breath sounds Heart: regular rate and rhythm Extremities: trace edema Skin: warm and dry Neurologic: Grossly normal  EKG NSR-HR 60, Qs V2-V3  ASSESSMENT AND PLAN:   Acute on chronic  diastolic CHF Weight up 15 lbs. She declined going to ED when seen by PCP 09/16/18  PAF Holding NSR  Chronic anticoagulation Xarelto  Morbid obesity BMI 53  HTN Elevated-losartan increased 09/16/18  Sleep apnea Needs C-pap  Chronic anemia Negative colonoscopy in the past.  Anemia possibly secondary to vaginal bleeding  Bipolar She needs to be established with OP psychiatry   PLAN  Increase Lasix to 80/40. Refill Iron and Losartan. F/U in one week. Check labs then.   Kerin Ransom PA-C 10/04/2018 12:45 PM

## 2018-10-10 MED FILL — POTASSIUM CL ER 20 MEQ TAB: 20 | 30 days supply | Qty: 60 | Fill #6

## 2018-10-10 MED FILL — risperiDONE 4 MG TABS: 4 | 30 days supply | Qty: 30 | Fill #0

## 2018-10-10 MED FILL — CARVEDILOL 25 MG TABLET: 25 | 30 days supply | Qty: 60 | Fill #6

## 2018-10-10 MED FILL — OXYBUTYNIN CL ER 15 MG TAB: 15 | 30 days supply | Qty: 30 | Fill #2

## 2018-10-10 MED FILL — XARELTO 20 MG TABLET: 20 | 30 days supply | Qty: 30 | Fill #0

## 2018-10-10 MED FILL — FUROSEMIDE 40 MG TAB: 40 | 30 days supply | Qty: 60 | Fill #1

## 2018-10-10 MED FILL — ATORVASTATIN CALCIUM 40 MG: 40 | 30 days supply | Qty: 30 | Fill #1

## 2018-10-11 ENCOUNTER — Ambulatory Visit (INDEPENDENT_AMBULATORY_CARE_PROVIDER_SITE_OTHER): Payer: Medicaid Other | Admitting: Cardiology

## 2018-10-11 VITALS — BP 140/86 | HR 64 | Ht 67.0 in | Wt 329.0 lb

## 2018-10-11 DIAGNOSIS — G4733 Obstructive sleep apnea (adult) (pediatric): Secondary | ICD-10-CM | POA: Diagnosis not present

## 2018-10-11 DIAGNOSIS — I5032 Chronic diastolic (congestive) heart failure: Secondary | ICD-10-CM

## 2018-10-11 DIAGNOSIS — I110 Hypertensive heart disease with heart failure: Secondary | ICD-10-CM | POA: Diagnosis not present

## 2018-10-11 DIAGNOSIS — I5033 Acute on chronic diastolic (congestive) heart failure: Secondary | ICD-10-CM | POA: Diagnosis not present

## 2018-10-11 LAB — BASIC METABOLIC PANEL
BUN/Creatinine Ratio: 17 (ref 9–23)
BUN: 14 mg/dL (ref 6–24)
CO2: 21 mmol/L (ref 20–29)
Calcium: 9.2 mg/dL (ref 8.7–10.2)
Chloride: 102 mmol/L (ref 96–106)
Creatinine, Ser: 0.82 mg/dL (ref 0.57–1.00)
GFR calc Af Amer: 96 mL/min/{1.73_m2} (ref >59)
GFR calc non Af Amer: 84 mL/min/{1.73_m2} (ref >59)
Glucose: 166 mg/dL — ABNORMAL HIGH (ref 65–99)
Potassium: 4 mmol/L (ref 3.5–5.2)
Sodium: 140 mmol/L (ref 134–144)

## 2018-10-11 LAB — PRO B NATRIURETIC PEPTIDE: NT-Pro BNP: 534 pg/mL — ABNORMAL HIGH (ref 0–249)

## 2018-10-11 MED FILL — PROAIR HFA 90 MCG INHALER: 108 (90 BAS | 25 days supply | Qty: 9 | Fill #1

## 2018-10-11 NOTE — Patient Instructions (Addendum)
Medication Instructions:   Stay on current Lasix dosage 80 mg in the mornings and 40 mg in the evenings  If you need a refill on your cardiac medications before your next appointment, please call your pharmacy.   Lab work: BMP BNP  If you have labs (blood work) drawn today and your tests are completely normal, you will receive your results only by: Marland Kitchen MyChart Message (if you have MyChart) OR . A paper copy in the mail If you have any lab test that is abnormal or we need to change your treatment, we will call you to review the results.  Testing/Procedures:  NONE  Follow-Up: At [facility], you and your health needs are our priority.  As part of our continuing mission to provide you with exceptional heart care, we have created designated Provider Care Teams.  These Care Teams include your primary Cardiologist (physician) and Advanced Practice Providers (APPs -  Physician Assistants and Nurse Practitioners) who all work together to provide you with the care you need, when you need it. You will need a follow up appointment in 3 months.  Please call our office 2 months in advance to schedule this appointment.  You may see [name], MD or one of the following Advanced Practice Providers on your designated Care Team:   Kerin Ransom, PA-C Roby Lofts, [place] . Sande Rives, PA-C  Any Other Special Instructions Will Be Listed Below (If Applicable).

## 2018-10-11 NOTE — Progress Notes (Signed)
10/11/2018 [name] AJGOTLX   01/26/1966  [id number]  Primary Physician [name], NP Primary Cardiologist: Dr [name]  HPI:  53 y.o.female from [place] with a hx of D-CHF,PAF w/ failed DCCV 02/2018 & 04/05/2018, CHA2DS2-VASc =3 (female, CHF, HTN) on Xarelto, HTN,morbid obesity, chronic intermittent chest pain with normal coronaries at cath Nov 2019,chronic anemia. OSAnot on CPAP, and prior Mycobacterium avium infection. She was admitted with chest pain and diastolic CHF Nov 7416.  Cath revealed normal coronaries with elevated LV pressures.  In the cath lab she had VF and was shocked.  She has maintained NSR since. She did have transient AKI post cath. She was seen in the office 08/22/18. Her weight then was 327 lbs. She was seen 10/04/18 in the office with SOB as a referral from her PCP. Her weight was up to 341 lbs.  She was in NSR.  Her diuretics were increased and she is seen today in follow up.  Her weight is back down to 329 lbs and she feels better, less SOB, no edema.   Current Outpatient Medications  Medication Sig Dispense Refill  . ACCU-CHEK SOFTCLIX LANCETS lancets USE AS DIRECTED TO TEST BLOOD SUGAR ONCE DAILY 100 each 12  . albuterol (VENTOLIN HFA) 108 (90 Base) MCG/ACT inhaler INHALE 1 TO 2 PUFFS EVERY 6 HOURS AS NEEDED FOR WHEEZING/ SHORTNESS OF BREATH 18 g 1  . atorvastatin (LIPITOR) 40 MG tablet Take 1 tablet (40 mg total) by mouth daily at 6 PM. 30 tablet 3  . Blood Glucose Monitoring Suppl (ACCU-CHEK AVIVA PLUS) w/Device KIT 1 each by Does not apply route daily. USE AS DIRECTED TO TEST BLOOD SUGAR ONCE DAILY 1 kit 0  . Blood Glucose Monitoring Suppl (TRUE METRIX METER) w/Device KIT Use as instructed. Check blood glucose levels by fingerstick twice per day 1 kit 0  . carvedilol (COREG) 25 MG tablet Take 1 tablet (25 mg total) by mouth 2 (two) times daily. 180 tablet 3  . ferrous sulfate (FEROSUL) 325 (65 FE) MG tablet Take 1 tablet (325 mg total) by mouth 2 (two) times  daily. 60 tablet 3  . furosemide (LASIX) 40 MG tablet Take 1 tablet (40 mg total) by mouth 2 (two) times daily. 60 tablet 6  . glipiZIDE (GLIPIZIDE XL) 10 MG 24 hr tablet Take 1 tablet (10 mg total) by mouth daily with breakfast. 90 tablet 2  . glucose blood (ACCU-CHEK AVIVA PLUS) test strip USE AS DIRECTED TO TEST BLOOD SUGAR ONCE DAILY 100 each 12  . losartan (COZAAR) 100 MG tablet Take 1 tablet (100 mg total) by mouth daily. 90 tablet 3  . metFORMIN (GLUCOPHAGE) 1000 MG tablet Take 1 tablet (1,000 mg total) by mouth 2 (two) times daily with a meal. 180 tablet 3  . Misc. Devices MISC Please provide patient with: BiPAP therapy on 20/16 cm H2O Medium size Resmed Full Face Mask AirFit 20 for mask and heated humidification 1 each 0  . oxybutynin (DITROPAN XL) 15 MG 24 hr tablet Take 1 tablet (15 mg total) by mouth at bedtime. 90 tablet 3  . potassium chloride SA (K-DUR,KLOR-CON) 20 MEQ tablet Take 1 tablet (20 mEq total) by mouth 2 (two) times daily. 60 tablet 6  . risperiDONE (RISPERDAL) 4 MG tablet Take 1 tablet (4 mg total) by mouth daily. 30 tablet 0  . rivaroxaban (XARELTO) 20 MG TABS tablet Take 1 tablet (20 mg total) by mouth daily with supper. 30 tablet 0  . TRUEPLUS LANCETS 28G  MISC Use as instructed. Check blood glucose levels by fingerstick twice per day 100 each 3   No current facility-administered medications for this visit.     Allergies  Allergen Reactions  . Acetaminophen Other (See Comments)    Seizure-like "fits" as a child  . Caffeine     Tense, anxiety, increased urination  . [place] [Dapagliflozin] Other (See Comments)    Hallucinations, drop in blood sugar  . Lisinopril Rash    Rash with lisinopril; but fosinopril is ok per patient    Past Medical History:  Diagnosis Date  . Acute on chronic diastolic congestive heart failure ([place]) 11/02/2013   10/03/2015, 11/13/2015, 08/03/2017  . Benign essential HTN 11/28/2013  . Bipolar disease, chronic ([place])   . Chest pain     a. 2012 Myoview: EF 63%, no isch/infarct;  b. 04/2016 Lexiscan MV: EF 73%, no ischemia/infarct-->Low risk.  . Chronic diastolic CHF (congestive heart failure) ([place]) 07/23/2011   a. 2015 Echo: EF 55-60%, Gr2 DD;  b. 09/2015 Echo: EF 60-65%, no rwma, mod dil LA, PASP 45mHg.  . Cor pulmonale (chronic) ([place]   . History of thyrotoxicosis   . HTN (hypertension) 11/28/2013  . Hypertensive heart disease 10/18/2013  . Hypoglycemia   . Insulin dependent type 2 diabetes mellitus, uncontrolled ([place]   . Mediastinal adenopathy   . Morbid obesity due to excess calories ([place] 02/19/2011  . Morbid obesity with BMI of 50.0-59.9, adult ([facility]   . OSA (obstructive sleep apnea) 03/06/2011  . Persistent atrial fibrillation 12/09/2017  . Pulmonary HTN, moderate to severe 11/03/2013  . Sinusitis, chronic 01/02/2015  . SVT (supraventricular tachycardia) ([place] 12/06/2013  . Uncontrolled type 2 diabetes mellitus with hyperglycemia (HCC)     Social History   Socioeconomic History  . Marital status: Single    Spouse name: Not on file  . Number of children: 0  . Years of education: 135 . Highest education level: Not on file  Occupational History  . Occupation: unemployed  Social Needs  . Financial resource strain: Not hard at all  . Food insecurity:    Worry: Never true    Inability: Never true  . Transportation needs:    Medical: No    Non-medical: No  Tobacco Use  . Smoking status: Never Smoker  . Smokeless tobacco: Never Used  Substance and Sexual Activity  . Alcohol use: No  . Drug use: No  . Sexual activity: Not Currently    Birth control/protection: None  Lifestyle  . Physical activity:    Days per week: Not on file    Minutes per session: Not on file  . Stress: Only a little  Relationships  . Social connections:    Talks on phone: Not on file    Gets together: Not on file    Attends religious service: Not on file    Active member of club or organization: Not on file    Attends meetings of  clubs or organizations: Not on file    Relationship status: Not on file  . Intimate partner violence:    Fear of current or ex partner: Not on file    Emotionally abused: Not on file    Physically abused: Not on file    Forced sexual activity: Not on file  Other Topics Concern  . Not on file  Social History Narrative   [place] enrolled in a MPH program at  [employer]. But ran out of money and is no longer attending school. (Note patient has bipolar disorder).      Born in [place] but lived in [place] before coming back to [place].       Primary language is Arabic. Lives with mother and brother.     Family History  Problem Relation Age of Onset  . Heart failure Father   . Stroke Father   . Hypertension Mother   . Heart disease Maternal Grandfather      Review of Systems: General: negative for chills, fever, night sweats or weight changes.  Cardiovascular: negative for chest pain, dyspnea on exertion, edema, orthopnea, palpitations, paroxysmal nocturnal dyspnea or shortness of breath Dermatological: negative for rash Respiratory: negative for cough or wheezing Urologic: negative for hematuria Abdominal: negative for nausea, vomiting, diarrhea, bright red blood per rectum, melena, or hematemesis Neurologic: negative for visual changes, syncope, or dizziness All other systems reviewed and are otherwise negative except as noted above.    Blood pressure 140/86, pulse 64, height _0  (1.702 m), weight (!) 329 lb (149.2 kg).  General appearance: alert, cooperative, no distress and morbidly obese Neck: no carotid bruit and no JVD Lungs: clear to auscultation bilaterally Heart: regular rate and rhythm Extremities: no edema Skin: Skin color, texture, turgor normal. No rashes or lesions Neurologic: Grossly normal    ASSESSMENT AND PLAN:   Acute on chronic diastolic CHF Improved - wgt down to 329 lbs  PAF Holding  NSR  Chronic anticoagulation Xarelto  Morbid obesity BMI 51  HTN stable-losartan increased 09/16/18  Sleep apnea Needs C-pap  Chronic anemia Negative colonoscopy in the past.  Anemia possibly secondary to vaginal bleeding  Bipolar She needs to be established with OP psychiatry  PLAN  Continue her current diuretic dose- I will check BMP and BNP.   Kerin Ransom PA-C 10/11/2018 11:17 AM

## 2018-10-24 ENCOUNTER — Other Ambulatory Visit: Payer: Self-pay | Admitting: Nurse Practitioner

## 2018-10-24 DIAGNOSIS — F329 Major depressive disorder, single episode, unspecified: Secondary | ICD-10-CM

## 2018-10-24 DIAGNOSIS — F419 Anxiety disorder, unspecified: Principal | ICD-10-CM

## 2018-11-15 ENCOUNTER — Other Ambulatory Visit: Payer: Self-pay | Admitting: Nurse Practitioner

## 2018-11-15 ENCOUNTER — Other Ambulatory Visit (HOSPITAL_COMMUNITY): Payer: Self-pay | Admitting: Physician Assistant

## 2018-11-15 DIAGNOSIS — F3162 Bipolar disorder, current episode mixed, moderate: Secondary | ICD-10-CM

## 2018-11-15 DIAGNOSIS — I4819 Other persistent atrial fibrillation: Secondary | ICD-10-CM

## 2018-11-15 MED FILL — LOSARTAN POTASSIUM 100 MG T: 100 | 30 days supply | Qty: 30 | Fill #1

## 2018-11-15 MED FILL — ATORVASTATIN CALCIUM 40 MG: 40 | 30 days supply | Qty: 30 | Fill #2

## 2018-11-15 MED FILL — CARVEDILOL 25 MG TABLET: 25 | 30 days supply | Qty: 60 | Fill #7

## 2018-11-15 MED FILL — OXYBUTYNIN CL ER 15 MG TAB: 15 | 30 days supply | Qty: 30 | Fill #3

## 2018-11-15 MED FILL — FUROSEMIDE 40 MG TAB: 40 | 30 days supply | Qty: 60 | Fill #2

## 2018-11-16 MED FILL — POTASSIUM CL ER 20 MEQ TAB: 20 | 30 days supply | Qty: 30 | Fill #0

## 2018-11-16 NOTE — Telephone Encounter (Signed)
Rx(s) sent to pharmacy electronically.  

## 2018-11-17 MED FILL — PROAIR HFA 90 MCG INHALER: 108 (90 BAS | 30 days supply | Qty: 9 | Fill #0

## 2018-11-21 MED FILL — XARELTO 20 MG TABLET: 20 | 30 days supply | Qty: 30 | Fill #0

## 2018-11-23 MED FILL — metFORMIN HCL 1000 MG TABS: 1000 | 30 days supply | Qty: 60 | Fill #2

## 2018-11-28 ENCOUNTER — Ambulatory Visit: Payer: Medicaid Other | Admitting: Cardiology

## 2018-11-28 ENCOUNTER — Ambulatory Visit (INDEPENDENT_AMBULATORY_CARE_PROVIDER_SITE_OTHER): Payer: Medicaid Other | Admitting: Cardiology

## 2018-11-28 ENCOUNTER — Encounter: Payer: Self-pay | Admitting: Cardiology

## 2018-11-28 VITALS — BP 148/100 | HR 70 | Ht 67.0 in | Wt 334.0 lb

## 2018-11-28 DIAGNOSIS — I480 Paroxysmal atrial fibrillation: Secondary | ICD-10-CM | POA: Diagnosis not present

## 2018-11-28 DIAGNOSIS — I5032 Chronic diastolic (congestive) heart failure: Secondary | ICD-10-CM | POA: Diagnosis not present

## 2018-11-28 DIAGNOSIS — I10 Essential (primary) hypertension: Secondary | ICD-10-CM

## 2018-11-28 MED ORDER — FUROSEMIDE 40 MG PO TABS: 80.0000 mg | ORAL_TABLET | Freq: Two times a day (BID) | ORAL | 6 refills | Status: DC

## 2018-11-28 MED ORDER — AMLODIPINE BESYLATE 5 MG PO TABS: 5.0000 mg | ORAL_TABLET | Freq: Every day | ORAL | 3 refills | Status: DC

## 2018-11-28 MED FILL — AMLODIPINE BESYLATE 5 MG TA: 5 | 30 days supply | Qty: 30 | Fill #0

## 2018-11-28 NOTE — Patient Instructions (Signed)
Medication Instructions:  INCREASE FUROSEMIDE TO 80 MG TWICE DAILY= 2 OF THE 40 MG TABLETS TWICE DAILY  START AMLODIPINE 5 MG ONCE DAILY If you need a refill on your cardiac medications before your next appointment, please call your pharmacy.   Lab work: Your physician recommends that you return for lab work in: [place] If you have labs (blood work) drawn today and your tests are completely normal, you will receive your results only by: Marland Kitchen MyChart Message (if you have MyChart) OR . A paper copy in the mail If you have any lab test that is abnormal or we need to change your treatment, we will call you to review the results.  Follow-Up: At [facility], you and your health needs are our priority.  As part of our continuing mission to provide you with exceptional heart care, we have created designated Provider Care Teams.  These Care Teams include your primary Cardiologist (physician) and Advanced Practice Providers (APPs -  Physician Assistants and Nurse Practitioners) who all work together to provide you with the care you need, when you need it. Your physician recommends that you schedule a follow-up appointment in: [place] physician recommends that you schedule a follow-up appointment in: [place]

## 2018-11-28 NOTE — Progress Notes (Signed)
HPI: Follow-up chest pain,chronicdiastolic congestive heart failure,atrial fibrillation,obesity and hypertension. Also with history of SVT and atrial fibrillation. Echocardiogram July 2019 showed normal LV function, mild mitral regurgitation, mild biatrial enlargement and moderate tricuspid regurgitation.  Admitted with recurrent chest pain November 2019. Enzymes positive. Follow-up limited study November 2019 showed normal LV function and biatrial enlargement.  CTA showed no pulmonary embolus.  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  Ejection fraction 65%.  Left ventricular end-diastolic pressure 26 mmHg.  Procedure complicated by ventricular fibrillation requiring defibrillation.  This also resulted in reestablishing sinus rhythm as patient had been in atrial fibrillation previously. Developed contrast nephropathy following procedure. Since last seen,she notes dyspnea on exertion, orthopnea and describes pedal edema.  She denies chest pain, palpitations or syncope.  Current Outpatient Medications  Medication Sig Dispense Refill  . ACCU-CHEK SOFTCLIX LANCETS lancets USE AS DIRECTED TO TEST BLOOD SUGAR ONCE DAILY 100 each 12  . albuterol (PROAIR HFA) 108 (90 Base) MCG/ACT inhaler INHALE 1 TO 2 PUFFS EVERY 6 HOURS AS NEEDED FOR WHEEZING/ SHORTNESS OF BREATH 8.5 g 1  . atorvastatin (LIPITOR) 40 MG tablet Take 1 tablet (40 mg total) by mouth daily at 6 PM. 30 tablet 3  . Blood Glucose Monitoring Suppl (ACCU-CHEK AVIVA PLUS) w/Device KIT 1 each by Does not apply route daily. USE AS DIRECTED TO TEST BLOOD SUGAR ONCE DAILY 1 kit 0  . Blood Glucose Monitoring Suppl (TRUE METRIX METER) w/Device KIT Use as instructed. Check blood glucose levels by fingerstick twice per day 1 kit 0  . carvedilol (COREG) 25 MG tablet Take 1 tablet (25 mg total) by mouth 2 (two) times  daily. 180 tablet 3  . ferrous sulfate (FEROSUL) 325 (65 FE) MG tablet Take 1 tablet (325 mg total) by mouth 2 (two) times daily. 60 tablet 3  . furosemide (LASIX) 40 MG tablet Take 1 tablet (40 mg total) by mouth 2 (two) times daily. 60 tablet 6  . glucose blood (ACCU-CHEK AVIVA PLUS) test strip USE AS DIRECTED TO TEST BLOOD SUGAR ONCE DAILY 100 each 12  . losartan (COZAAR) 100 MG tablet Take 1 tablet (100 mg total) by mouth daily. 90 tablet 3  . metFORMIN (GLUCOPHAGE) 1000 MG tablet Take 1 tablet (1,000 mg total) by mouth 2 (two) times daily with a meal. 180 tablet 3  . Misc. Devices MISC Please provide patient with: BiPAP therapy on 20/16 cm H2O Medium size Resmed Full Face Mask AirFit 20 for mask and heated humidification 1 each 0  . oxybutynin (DITROPAN XL) 15 MG 24 hr tablet Take 1 tablet (15 mg total) by mouth at bedtime. 90 tablet 3  . Potassium Chloride ER 20 MEQ TBCR Take 20 mEq by mouth 2 (two) times daily. 60 tablet 11  . TRUEPLUS LANCETS 28G MISC Use as instructed. Check blood glucose levels by fingerstick twice per day 100 each 3  . XARELTO 20 MG TABS tablet TAKE 1 TABLET (20 MG TOTAL) BY MOUTH DAILY WITH SUPPER. 30 tablet 0  . glipiZIDE (GLIPIZIDE XL) 10 MG 24 hr tablet Take 1 tablet (10 mg total) by mouth daily with breakfast. 90 tablet 2  . risperiDONE (RISPERDAL) 4 MG tablet Take 1 tablet (4 mg total) by mouth daily. 30 tablet 0   No current facility-administered medications for this visit.      Past Medical History:  Diagnosis Date  . Acute on chronic diastolic congestive heart failure ([place]) 11/02/2013   10/03/2015, 11/13/2015, 08/03/2017  . Benign essential HTN 11/28/2013  . Bipolar disease, chronic ([place])   . Chest pain    a. 2012 Myoview: EF 63%, no isch/infarct;  b. 04/2016 Lexiscan MV: EF 73%, no ischemia/infarct-->Low risk.  . Chronic diastolic CHF (congestive heart failure) ([place]) 07/23/2011   a. 2015 Echo: EF 55-60%, Gr2 DD;  b. 09/2015 Echo: EF 60-65%, no rwma, mod  dil LA, PASP 66mHg.  . Cor pulmonale (chronic) ([facility]   . History of thyrotoxicosis   . HTN (hypertension) 11/28/2013  . Hypertensive heart disease 10/18/2013  . Hypoglycemia   . Insulin dependent type 2 diabetes mellitus, uncontrolled ([facility]   . Mediastinal adenopathy   . Morbid obesity due to excess calories ([facility] 02/19/2011  . Morbid obesity with BMI of 50.0-59.9, adult ([facility]   . OSA (obstructive sleep apnea) 03/06/2011  . Persistent atrial fibrillation 12/09/2017  . Pulmonary HTN, moderate to severe 11/03/2013  . Sinusitis, chronic 01/02/2015  . SVT (supraventricular tachycardia) ([facility] 12/06/2013  . Uncontrolled type 2 diabetes mellitus with hyperglycemia ([facility]     Past Surgical History:  Procedure Laterality Date  . CARDIOVERSION N/A 04/05/2018   Procedure: CARDIOVERSION;  Surgeon: CLelon Perla MD;  [facility];  Service: Cardiovascular;  Laterality: N/A;  . COLONOSCOPY WITH PROPOFOL Left 07/16/2018   Procedure: COLONOSCOPY WITH PROPOFOL;  Surgeon: KRonnette Juniper MD;  Location: [facility];  Service: Gastroenterology;  Laterality: Left;  . LEFT HEART CATH AND CORONARY ANGIOGRAPHY N/A 08/04/2018   Procedure: LEFT HEART CATH AND CORONARY ANGIOGRAPHY;  Surgeon: SBelva Crome MD;  [facility];  Service: Cardiovascular;  Laterality: N/A;  . None    . POLYPECTOMY  07/16/2018   Procedure: POLYPECTOMY;  Surgeon: KRonnette Juniper MD;  [facility];  Service: Gastroenterology;;    Social History   Socioeconomic History  . Marital status: Single    Spouse name: Not on file  . Number of children: 0  . Years of education: 167 . Highest education level: Not on file  Occupational History  . Occupation: unemployed  Social Needs  . Financial resource strain: Not hard at all  . Food insecurity:    Worry: Never true    Inability: Never true  . Transportation needs:    Medical: No    Non-medical: No  Tobacco Use  . Smoking status: Never Smoker  .  Smokeless tobacco: Never Used  Substance and Sexual Activity  . Alcohol use: No  . Drug use: No  . Sexual activity: Not Currently    Birth control/protection: None  Lifestyle  . Physical activity:    Days per week: Not on file    Minutes per session: Not on file  . Stress: Only a little  Relationships  . Social connections:    Talks on phone: Not on file    Gets together: Not on file    Attends religious service: Not on file    Active member of club or organization: Not on file    Attends meetings of clubs or organizations: Not on file    Relationship status: Not on file  . Intimate partner violence:    Fear of current or ex partner: Not on file    Emotionally abused: Not on file    Physically abused: Not on file    Forced sexual activity: Not on file  Other Topics Concern  . 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 But ran out of money and is no longer attending school. (Note patient has bipolar disorder).      Born in [place] but lived in [place] before coming back to [place].       Primary language is Arabic. Lives with mother and brother.    Family History  Problem Relation Age of Onset  . Heart failure Father   . Stroke Father   . Hypertension Mother   . Heart disease Maternal Grandfather     ROS: no fevers or chills, productive cough, hemoptysis, dysphasia, odynophagia, melena, hematochezia, dysuria, hematuria, rash, seizure activity, orthopnea, PND, pedal edema, claudication. Remaining systems are negative.  Physical Exam: Well-developed morbidly obese in no acute distress.  Skin is warm and dry.  HEENT is normal.  Neck is supple.  Chest is clear to auscultation with normal expansion.  Cardiovascular exam is regular rate and rhythm.  Abdominal exam nontender or distended. No masses palpated. Extremities show no edema. neuro grossly intact  A/P  1  paroxysmal atrial fibrillation-patient is in sinus rhythm on examination today.  Continue beta-blocker for rate control if atrial fibrillation recurs.  Continue Xarelto.  Check hemoglobin.  2 chronic diastolic congestive heart failure-she complains of dyspnea on exertion and orthopnea.  Exam difficult due to obesity.  I will increase Lasix to 80 mg twice daily.  Check potassium and renal function in 1 week.  Continue fluid restriction and low-sodium diet.  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  Given that this is her right coronary artery I think best option is to continue medical therapy.  4 hypertension-patient's blood pressure is elevated.  Add amlodipine 5 mg daily and follow.  5 morbid obesity-we discussed the importance of weight loss and exercise.  6 chronic chest pain-no recent symptoms.  She does have anomalous coronary but no other coronary disease.  No further intervention at this time.  [name], MD

## 2018-12-05 DIAGNOSIS — I5032 Chronic diastolic (congestive) heart failure: Secondary | ICD-10-CM | POA: Diagnosis not present

## 2018-12-05 DIAGNOSIS — I480 Paroxysmal atrial fibrillation: Secondary | ICD-10-CM | POA: Diagnosis not present

## 2018-12-05 MED FILL — ACCU-CHEK SOFTCLIX LANCETS: 30 days supply | Qty: 100 | Fill #1

## 2018-12-05 MED FILL — FUROSEMIDE 40 MG TAB: 40 | 30 days supply | Qty: 120 | Fill #0

## 2018-12-06 ENCOUNTER — Encounter: Payer: Self-pay | Admitting: *Deleted

## 2018-12-06 LAB — BASIC METABOLIC PANEL
BUN/Creatinine Ratio: 20 (ref 9–23)
BUN: 16 mg/dL (ref 6–24)
CO2: 22 mmol/L (ref 20–29)
CREATININE: 0.8 mg/dL (ref 0.57–1.00)
Calcium: 8.8 mg/dL (ref 8.7–10.2)
Chloride: 99 mmol/L (ref 96–106)
GFR calc Af Amer: 99 mL/min/{1.73_m2} (ref >59)
GFR, EST NON AFRICAN AMERICAN: 86 mL/min/{1.73_m2} (ref >59)
Glucose: 205 mg/dL — ABNORMAL HIGH (ref 65–99)
Potassium: 4 mmol/L (ref 3.5–5.2)
Sodium: 139 mmol/L (ref 134–144)

## 2018-12-06 LAB — CBC
Hematocrit: 39.3 % (ref 34.0–46.6)
Hemoglobin: 12.3 g/dL (ref 11.1–15.9)
MCH: 26.5 pg — AB (ref 26.6–33.0)
MCHC: 31.3 g/dL — ABNORMAL LOW (ref 31.5–35.7)
MCV: 85 fL (ref 79–97)
Platelets: 280 10*3/uL (ref 150–450)
RBC: 4.65 x10E6/uL (ref 3.77–5.28)
RDW: 15.2 % (ref 11.7–15.4)
WBC: 6.7 10*3/uL (ref 3.4–10.8)

## 2018-12-16 ENCOUNTER — Other Ambulatory Visit: Payer: Self-pay | Admitting: Nurse Practitioner

## 2018-12-16 DIAGNOSIS — I4819 Other persistent atrial fibrillation: Secondary | ICD-10-CM

## 2018-12-16 DIAGNOSIS — E1165 Type 2 diabetes mellitus with hyperglycemia: Secondary | ICD-10-CM

## 2018-12-20 MED FILL — metFORMIN HCL 1000 MG TABS: 1000 | 30 days supply | Qty: 60 | Fill #0

## 2018-12-29 MED FILL — OXYBUTYNIN CL ER 15 MG TAB: 15 | 30 days supply | Qty: 30 | Fill #4

## 2018-12-29 MED FILL — LOSARTAN POTASSIUM 100 MG T: 100 | 30 days supply | Qty: 30 | Fill #2

## 2018-12-29 MED FILL — FUROSEMIDE 40 MG TAB: 40 | 30 days supply | Qty: 60 | Fill #3

## 2018-12-29 MED FILL — ATORVASTATIN CALCIUM 40 MG: 40 | 30 days supply | Qty: 30 | Fill #3

## 2018-12-29 MED FILL — AMLODIPINE BESYLATE 5 MG TA: 5 | 30 days supply | Qty: 30 | Fill #1

## 2018-12-29 MED FILL — CARVEDILOL 25 MG TABLET: 25 | 30 days supply | Qty: 60 | Fill #8

## 2018-12-29 MED FILL — PROAIR HFA 90 MCG INHALER: 108 (90 BAS | 30 days supply | Qty: 9 | Fill #1

## 2018-12-29 MED FILL — XARELTO 20 MG TABLET: 20 | 30 days supply | Qty: 30 | Fill #0

## 2018-12-30 MED FILL — POTASSIUM CL ER 20 MEQ TAB: 20 | 30 days supply | Qty: 60 | Fill #0

## 2019-01-09 ENCOUNTER — Telehealth: Payer: Self-pay

## 2019-01-09 NOTE — Telephone Encounter (Signed)
LEFT DETAILED MESSAGE FOR PATIENT TO CONTACT OFFICE TO CHANGE APPT TO A VIRTUAL VISIT.

## 2019-01-11 ENCOUNTER — Ambulatory Visit: Payer: Medicaid Other | Admitting: Cardiology

## 2019-01-26 ENCOUNTER — Other Ambulatory Visit: Payer: Self-pay | Admitting: Nurse Practitioner

## 2019-01-26 DIAGNOSIS — F3162 Bipolar disorder, current episode mixed, moderate: Secondary | ICD-10-CM

## 2019-01-26 MED FILL — AMLODIPINE BESYLATE 5 MG TA: 5 | 30 days supply | Qty: 30 | Fill #2

## 2019-01-26 MED FILL — metFORMIN HCL 1000 MG TABS: 1000 | 30 days supply | Qty: 60 | Fill #1

## 2019-01-26 MED FILL — OXYBUTYNIN CL ER 15 MG TAB: 15 | 30 days supply | Qty: 30 | Fill #5

## 2019-01-26 MED FILL — CARVEDILOL 25 MG TABLET: 25 | 30 days supply | Qty: 60 | Fill #9

## 2019-01-26 MED FILL — XARELTO 20 MG TABLET: 20 | 30 days supply | Qty: 30 | Fill #1

## 2019-01-26 MED FILL — LOSARTAN POTASSIUM 100 MG T: 100 | 30 days supply | Qty: 30 | Fill #3

## 2019-01-26 MED FILL — FUROSEMIDE 40 MG TAB: 40 | 30 days supply | Qty: 60 | Fill #4

## 2019-01-26 MED FILL — POTASSIUM CL ER 20 MEQ TAB: 20 | 30 days supply | Qty: 60 | Fill #1

## 2019-01-27 ENCOUNTER — Other Ambulatory Visit: Payer: Self-pay | Admitting: Pharmacist

## 2019-01-27 MED ORDER — ATORVASTATIN CALCIUM 40 MG PO TABS: 40.0000 mg | ORAL_TABLET | Freq: Every day | ORAL | 0 refills | Status: DC

## 2019-01-27 MED FILL — risperiDONE 4 MG TABS: 4 | 30 days supply | Qty: 30 | Fill #0

## 2019-01-27 MED FILL — ATORVASTATIN CALCIUM 40 MG: 40 | 90 days supply | Qty: 90 | Fill #0

## 2019-01-27 MED FILL — PROAIR HFA 90 MCG INHALER: 108 (90 BAS | 25 days supply | Qty: 9 | Fill #0

## 2019-02-23 ENCOUNTER — Other Ambulatory Visit: Payer: Self-pay | Admitting: Family Medicine

## 2019-02-23 ENCOUNTER — Other Ambulatory Visit: Payer: Self-pay | Admitting: Nurse Practitioner

## 2019-02-23 DIAGNOSIS — F3162 Bipolar disorder, current episode mixed, moderate: Secondary | ICD-10-CM

## 2019-02-23 DIAGNOSIS — I4819 Other persistent atrial fibrillation: Secondary | ICD-10-CM

## 2019-02-23 MED FILL — POTASSIUM CL ER 20 MEQ TAB: 20 | 90 days supply | Qty: 180 | Fill #2

## 2019-02-23 MED FILL — PROAIR HFA 90 MCG INHALER: 108 (90 BAS | 25 days supply | Qty: 9 | Fill #1

## 2019-02-23 MED FILL — OXYBUTYNIN CL ER 15 MG TAB: 15 | 90 days supply | Qty: 90 | Fill #6

## 2019-02-23 MED FILL — LOSARTAN POTASSIUM 100 MG T: 100 | 90 days supply | Qty: 90 | Fill #4

## 2019-02-23 MED FILL — AMLODIPINE BESYLATE 5 MG TA: 5 | 90 days supply | Qty: 90 | Fill #3

## 2019-02-23 MED FILL — FUROSEMIDE 40 MG TAB: 40 | 60 days supply | Qty: 120 | Fill #5

## 2019-02-23 MED FILL — metFORMIN HCL 1000 MG TABS: 1000 | 30 days supply | Qty: 60 | Fill #2

## 2019-02-23 MED FILL — CARVEDILOL 25 MG TABLET: 25 | 60 days supply | Qty: 120 | Fill #10

## 2019-02-24 MED FILL — glipiZIDE XL 10 MG TB24: 10 | 90 days supply | Qty: 90 | Fill #1

## 2019-02-28 ENCOUNTER — Telehealth: Payer: Self-pay

## 2019-02-28 MED FILL — risperiDONE 4 MG TABS: 4 | 30 days supply | Qty: 30 | Fill #0

## 2019-02-28 MED FILL — XARELTO 20 MG TABLET: 20 | 30 days supply | Qty: 30 | Fill #0

## 2019-02-28 NOTE — Telephone Encounter (Signed)
Left a detailed message for the patient about switching her upcoming appointment to a virtual visit and also changing the time for that day. Will try calling patient again.

## 2019-03-08 ENCOUNTER — Encounter: Payer: Self-pay | Admitting: *Deleted

## 2019-03-14 ENCOUNTER — Telehealth: Payer: Self-pay | Admitting: Cardiology

## 2019-03-14 ENCOUNTER — Other Ambulatory Visit: Payer: Self-pay

## 2019-03-14 ENCOUNTER — Telehealth: Payer: Self-pay | Admitting: *Deleted

## 2019-03-14 ENCOUNTER — Encounter: Payer: Self-pay | Admitting: Cardiology

## 2019-03-14 ENCOUNTER — Ambulatory Visit (INDEPENDENT_AMBULATORY_CARE_PROVIDER_SITE_OTHER): Payer: Medicaid Other | Admitting: Cardiology

## 2019-03-14 VITALS — BP 147/103 | HR 90 | Ht 67.0 in | Wt 333.0 lb

## 2019-03-14 DIAGNOSIS — I5032 Chronic diastolic (congestive) heart failure: Secondary | ICD-10-CM

## 2019-03-14 DIAGNOSIS — I480 Paroxysmal atrial fibrillation: Secondary | ICD-10-CM

## 2019-03-14 DIAGNOSIS — I10 Essential (primary) hypertension: Secondary | ICD-10-CM

## 2019-03-14 MED ORDER — AMLODIPINE BESYLATE 10 MG PO TABS: 10.0000 mg | ORAL_TABLET | Freq: Every day | ORAL | 3 refills | Status: DC

## 2019-03-14 NOTE — Telephone Encounter (Signed)

## 2019-03-14 NOTE — Telephone Encounter (Signed)
Tried calling patient to et her ready for her virtual visit today.

## 2019-03-14 NOTE — Progress Notes (Signed)
Virtual Visit via telephone Note   This visit type was conducted due to national recommendations for restrictions regarding the COVID-19 Pandemic (e.g. social distancing) in an effort to limit this patient's exposure and mitigate transmission in our community.  Due to her co-morbid illnesses, this patient is at least at moderate risk for complications without adequate follow up.  This format is felt to be most appropriate for this patient at this time.  All issues noted in this document were discussed and addressed.  A limited physical exam was performed with this format.  Please refer to the patient's chart for her consent to telehealth for [facility].   Date:  03/14/2019   ID:  [name], DOB November 12, 1965, MRN [id number]  Patient Location: Home Provider Location: Home  PCP:  [name], NP  Cardiologist:  [name], MD   Evaluation Performed:  Follow-Up Visit  Chief Complaint:  Atrial fibrillation  History of Present Illness:    Follow-up chest pain,chronicdiastolic congestive heart failure,atrial fibrillation,obesity and hypertension. Also with history of SVTand atrial fibrillation. Echocardiogram July 2019 showed normal LV function, mild mitral regurgitation, mild biatrial enlargement and moderate tricuspid regurgitation. Admitted with recurrent chest pain November 2019. Enzymes positive.Follow-up limited study November 2019 showed normal LV function and biatrial enlargement. CTA showed no pulmonary embolus. 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 Ejection fraction 65%. Left ventricular end-diastolic pressure 26 mmHg. Procedure complicated by ventricular fibrillation requiring defibrillation. This also resulted in reestablishing sinus rhythm as patient had been in atrial fibrillation previously. Developed contrast nephropathy  following procedure. Since last seen,she denies dyspnea.  Occasional chest pain relieved with rubbing ointment.  She notes bilateral lower extremity edema.  No syncope.  The patient does not have symptoms concerning for COVID-19 infection (fever, chills, cough, or new shortness of breath).    Past Medical History:  Diagnosis Date   Acute on chronic diastolic congestive heart failure ([place]) 11/02/2013   10/03/2015, 11/13/2015, 08/03/2017   Benign essential HTN 11/28/2013   Bipolar disease, chronic ([place])    Chest pain    a. 2012 Myoview: EF 63%, no isch/infarct;  b. 04/2016 Lexiscan MV: EF 73%, no ischemia/infarct-->Low risk.   Chronic diastolic CHF (congestive heart failure) ([place]) 07/23/2011   a. 2015 Echo: EF 55-60%, Gr2 DD;  b. 09/2015 Echo: EF 60-65%, no rwma, mod dil LA, PASP 71mHg.   Cor pulmonale (chronic) ([facility])    History of thyrotoxicosis    HTN (hypertension) 11/28/2013   Hypertensive heart disease 10/18/2013   Hypoglycemia    Insulin dependent type 2 diabetes mellitus, uncontrolled ([facility]    Mediastinal adenopathy    Morbid obesity due to excess calories ([facility] 02/19/2011   Morbid obesity with BMI of 50.0-59.9, adult ([facility])    OSA (obstructive sleep apnea) 03/06/2011   Persistent atrial fibrillation 12/09/2017   Pulmonary HTN, moderate to severe 11/03/2013   Sinusitis, chronic 01/02/2015   SVT (supraventricular tachycardia) ([facility] 12/06/2013   Uncontrolled type 2 diabetes mellitus with hyperglycemia ([facility]    Past Surgical History:  Procedure Laterality Date   CARDIOVERSION N/A 04/05/2018   Procedure: CARDIOVERSION;  Surgeon: CLelon Perla MD;  [facility]  Service: Cardiovascular;  Laterality: N/A;   COLONOSCOPY WITH PROPOFOL Left 07/16/2018   Procedure: COLONOSCOPY WITH PROPOFOL;  Surgeon: KRonnette Juniper MD;  [facility];  Service: Gastroenterology;  Laterality: Left;   LEFT HEART CATH  AND CORONARY ANGIOGRAPHY N/A 08/04/2018   Procedure:  LEFT HEART CATH AND CORONARY ANGIOGRAPHY;  Surgeon: Belva Crome, MD;  Location: [place] [facility];  Service: Cardiovascular;  Laterality: N/A;   None     POLYPECTOMY  07/16/2018   Procedure: POLYPECTOMY;  Surgeon: Ronnette Juniper, MD;  [facility];  Service: Gastroenterology;;     Current Meds  Medication Sig   ACCU-CHEK SOFTCLIX LANCETS lancets USE AS DIRECTED TO TEST BLOOD SUGAR ONCE DAILY   albuterol (PROAIR HFA) 108 (90 Base) MCG/ACT inhaler INHALE 1 TO 2 PUFFS EVERY 6 HOURS AS NEEDED FOR WHEEZING/ SHORTNESS OF BREATH   amLODipine (NORVASC) 5 MG tablet Take 1 tablet (5 mg total) by mouth daily.   atorvastatin (LIPITOR) 40 MG tablet TAKE 1 TABLET (40 MG TOTAL) BY MOUTH DAILY AT 6 PM.   Blood Glucose Monitoring Suppl (ACCU-CHEK AVIVA PLUS) w/Device KIT 1 each by Does not apply route daily. USE AS DIRECTED TO TEST BLOOD SUGAR ONCE DAILY   Blood Glucose Monitoring Suppl (TRUE METRIX METER) w/Device KIT Use as instructed. Check blood glucose levels by fingerstick twice per day   ferrous sulfate (FEROSUL) 325 (65 FE) MG tablet Take 1 tablet (325 mg total) by mouth 2 (two) times daily.   furosemide (LASIX) 40 MG tablet Take 2 tablets (80 mg total) by mouth 2 (two) times daily.   glucose blood (ACCU-CHEK AVIVA PLUS) test strip USE AS DIRECTED TO TEST BLOOD SUGAR ONCE DAILY   metFORMIN (GLUCOPHAGE) 1000 MG tablet TAKE 1 TABLET (1,000 MG TOTAL) BY MOUTH 2 (TWO) TIMES DAILY WITH A MEAL.   Misc. Devices MISC Please provide patient with: BiPAP therapy on 20/16 cm H2O Medium size Resmed Full Face Mask AirFit 20 for mask and heated humidification   oxybutynin (DITROPAN XL) 15 MG 24 hr tablet Take 1 tablet (15 mg total) by mouth at bedtime.   Potassium Chloride ER 20 MEQ TBCR Take 20 mEq by mouth 2 (two) times daily.   risperidone (RISPERDAL) 4 MG tablet TAKE 1 TABLET BY MOUTH DAILY.   TRUEPLUS LANCETS 28G MISC Use as instructed. Check blood glucose levels by fingerstick twice  per day   XARELTO 20 MG TABS tablet TAKE 1 TABLET (20 MG TOTAL) BY MOUTH DAILY WITH SUPPER.     Allergies:   Acetaminophen, Caffeine, Farxiga [dapagliflozin], and Lisinopril   Social History   Tobacco Use   Smoking status: Never Smoker   Smokeless tobacco: Never Used  Substance Use Topics   Alcohol use: No   Drug use: No     Family Hx: The patient's family history includes Heart disease in her maternal grandfather; Heart failure in her father; Hypertension in her mother; Stroke in her father.  ROS:   Please see the history of present illness.    No fevers, chills or productive cough. All other systems reviewed and are negative.  Recent Labs: 07/14/2018: ALT 14 08/03/2018: Magnesium 1.6 08/07/2018: B Natriuretic Peptide 569.8 10/11/2018: NT-Pro BNP 534 12/05/2018: BUN 16; Creatinine, Ser 0.80; Hemoglobin 12.3; Platelets 280; Potassium 4.0; Sodium 139   Recent Lipid Panel Lab Results  Component Value Date/Time   CHOL 153 06/21/2018 04:39 PM   TRIG 199 (H) 06/21/2018 04:39 PM   HDL 45 06/21/2018 04:39 PM   CHOLHDL 3.4 06/21/2018 04:39 PM   CHOLHDL 3.8 08/06/2016 12:45 PM   LDLCALC 68 06/21/2018 04:39 PM    Wt Readings from Last 3 Encounters:  03/14/19 (!) 333 lb (151 kg)  11/28/18 (!) 334  lb (151.5 kg)  10/11/18 (!) 329 lb (149.2 kg)     Objective:    Vital Signs:  BP (!) 147/103    Pulse 90    Ht '5\' 7"'  (1.702 m)    Wt (!) 333 lb (151 kg)    BMI 52.16 kg/m    VITAL SIGNS:  reviewed  No acute distress Answers questions appropriately Remainder physical examination not performed (coronavirus pandemic; telehealth visit)  ASSESSMENT & PLAN:    1. Paroxysmal atrial fibrillation-we will continue with beta-blocker for rate control if atrial fibrillation recurs.  Continue Xarelto.  Check hemoglobin and renal function. 2. 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 3. Chronic diastolic congestive heart failure-plan to continue Lasix at present dose.  We discussed the importance of fluid restriction and low-sodium diet.  Check potassium and renal function. 4. Hypertension-blood pressure elevated.  Increase amlodipine to 10 mg daily and follow. 5. Chronic chest pain-as outlined previously she does have an anomalous right coronary artery but no other coronary disease on prior catheterization.  Plan medical therapy. 6. Morbid obesity-we discussed the importance of weight loss.  COVID-19 Education: The importance of social distancing was discussed today.  Time:   Today, I have spent 20 minutes with the patient with telehealth technology discussing the above problems.     Medication Adjustments/Labs and Tests Ordered: Current medicines are reviewed at length with the patient today.  Concerns regarding medicines are outlined above.   Tests Ordered: No orders of the defined types were placed in this encounter.   Medication Changes: No orders of the defined types were placed in this encounter.   Follow Up:  Virtual Visit or In Person in 6 month(s)  Signed, [name], MD  03/14/2019 11:08 AM    [place]

## 2019-03-14 NOTE — Patient Instructions (Signed)
Medication Instructions:  INCREASE AMLODIPINE TO 10 MG ONCE DAILY= 2 OF THE 5 MG TABLETS ONCE DAILY If you need a refill on your cardiac medications before your next appointment, please call your pharmacy.   Lab work: Your physician recommends that you return for lab work WHEN CONVENIENT If you have labs (blood work) drawn today and your tests are completely normal, you will receive your results only by: Marland Kitchen MyChart Message (if you have MyChart) OR . A paper copy in the mail If you have any lab test that is abnormal or we need to change your treatment, we will call you to review the results.  Follow-Up: At [facility], you and your health needs are our priority.  As part of our continuing mission to provide you with exceptional heart care, we have created designated Provider Care Teams.  These Care Teams include your primary Cardiologist (physician) and Advanced Practice Providers (APPs -  Physician Assistants and Nurse Practitioners) who all work together to provide you with the care you need, when you need it. You will need a follow up appointment in 6 months.  Please call our office 2 months in advance to schedule this appointment.  You may see [name], MD or one of the following Advanced Practice Providers on your designated Care Team:   Kerin Ransom, PA-C Roby Lofts, [place] . Sande Rives, PA-C

## 2019-03-14 NOTE — Telephone Encounter (Signed)
New message      Patient returning your call to get setup for appt

## 2019-03-29 ENCOUNTER — Other Ambulatory Visit: Payer: Self-pay | Admitting: Nurse Practitioner

## 2019-03-29 DIAGNOSIS — F3162 Bipolar disorder, current episode mixed, moderate: Secondary | ICD-10-CM

## 2019-03-29 DIAGNOSIS — E1165 Type 2 diabetes mellitus with hyperglycemia: Secondary | ICD-10-CM

## 2019-03-29 MED FILL — XARELTO 20 MG TABLET: 20 | 30 days supply | Qty: 30 | Fill #1

## 2019-03-29 MED FILL — FUROSEMIDE 40 MG TAB: 40 | 30 days supply | Qty: 120 | Fill #1

## 2019-03-30 MED FILL — risperiDONE 4 MG TABS: 4 | 30 days supply | Qty: 30 | Fill #0

## 2019-03-30 MED FILL — metFORMIN HCL 1000 MG TABS: 1000 | 30 days supply | Qty: 60 | Fill #0

## 2019-04-09 ENCOUNTER — Emergency Department (HOSPITAL_COMMUNITY)
Admission: EM | Admit: 2019-04-09 | Discharge: 2019-04-10 | Disposition: A | Discharge status: Home / Self Care | Payer: Medicaid Other | Attending: Emergency Medicine | Admitting: Emergency Medicine

## 2019-04-09 ENCOUNTER — Other Ambulatory Visit: Payer: Self-pay

## 2019-04-09 ENCOUNTER — Emergency Department (HOSPITAL_COMMUNITY): Payer: Medicaid Other

## 2019-04-09 ENCOUNTER — Encounter (HOSPITAL_COMMUNITY): Payer: Self-pay

## 2019-04-09 DIAGNOSIS — Z7984 Long term (current) use of oral hypoglycemic drugs: Secondary | ICD-10-CM | POA: Diagnosis not present

## 2019-04-09 DIAGNOSIS — Z7901 Long term (current) use of anticoagulants: Secondary | ICD-10-CM | POA: Diagnosis not present

## 2019-04-09 DIAGNOSIS — R600 Localized edema: Secondary | ICD-10-CM | POA: Diagnosis not present

## 2019-04-09 DIAGNOSIS — Z79899 Other long term (current) drug therapy: Secondary | ICD-10-CM | POA: Diagnosis not present

## 2019-04-09 DIAGNOSIS — R079 Chest pain, unspecified: Secondary | ICD-10-CM | POA: Diagnosis not present

## 2019-04-09 DIAGNOSIS — I5033 Acute on chronic diastolic (congestive) heart failure: Secondary | ICD-10-CM | POA: Insufficient documentation

## 2019-04-09 DIAGNOSIS — R072 Precordial pain: Secondary | ICD-10-CM | POA: Diagnosis not present

## 2019-04-09 DIAGNOSIS — R0602 Shortness of breath: Secondary | ICD-10-CM | POA: Diagnosis not present

## 2019-04-09 DIAGNOSIS — I252 Old myocardial infarction: Secondary | ICD-10-CM | POA: Insufficient documentation

## 2019-04-09 DIAGNOSIS — I110 Hypertensive heart disease with heart failure: Secondary | ICD-10-CM | POA: Diagnosis not present

## 2019-04-09 DIAGNOSIS — E119 Type 2 diabetes mellitus without complications: Secondary | ICD-10-CM | POA: Insufficient documentation

## 2019-04-09 LAB — I-STAT BETA HCG BLOOD, ED (MC, WL, AP ONLY): I-stat hCG, quantitative: <5 m[IU]/mL (ref <5)

## 2019-04-09 LAB — CBC
HCT: 36.8 % (ref 36.0–46.0)
Hemoglobin: 11.3 g/dL — ABNORMAL LOW (ref 12.0–15.0)
MCH: 26.5 pg (ref 26.0–34.0)
MCHC: 30.7 g/dL (ref 30.0–36.0)
MCV: 86.2 fL (ref 80.0–100.0)
Platelets: 196 10*3/uL (ref 150–400)
RBC: 4.27 MIL/uL (ref 3.87–5.11)
RDW: 15.2 % (ref 11.5–15.5)
WBC: 6 10*3/uL (ref 4.0–10.5)
nRBC: 0 % (ref 0.0–0.2)

## 2019-04-09 LAB — BASIC METABOLIC PANEL
Anion gap: 10 (ref 5–15)
BUN: 16 mg/dL (ref 6–20)
CO2: 23 mmol/L (ref 22–32)
Calcium: 9.5 mg/dL (ref 8.9–10.3)
Chloride: 102 mmol/L (ref 98–111)
Creatinine, Ser: 0.82 mg/dL (ref 0.44–1.00)
GFR calc Af Amer: >60 mL/min (ref >60)
GFR calc non Af Amer: >60 mL/min (ref >60)
Glucose, Bld: 231 mg/dL — ABNORMAL HIGH (ref 70–99)
Potassium: 3.7 mmol/L (ref 3.5–5.1)
Sodium: 135 mmol/L (ref 135–145)

## 2019-04-09 LAB — TROPONIN I (HIGH SENSITIVITY): Troponin I (High Sensitivity): 3 ng/L (ref <18)

## 2019-04-09 MED ORDER — FUROSEMIDE 10 MG/ML IJ SOLN
60.0000 mg | Freq: Once | INTRAMUSCULAR | Status: AC
  Administered 2019-04-10: 60 mg via INTRAVENOUS
  Filled 2019-04-09: qty 6

## 2019-04-09 MED ORDER — RIVAROXABAN 20 MG PO TABS
20.0000 mg | ORAL_TABLET | Freq: Once | ORAL | Status: AC
  Administered 2019-04-10: 20 mg via ORAL
  Filled 2019-04-09: qty 1

## 2019-04-09 MED ORDER — SODIUM CHLORIDE 0.9% FLUSH: 3.0000 mL | Freq: Once | INTRAVENOUS | Status: DC

## 2019-04-09 NOTE — ED Triage Notes (Signed)
Pt has multiple complaints, chest pain, SOB on exertion and bil leg swelling and pain. Bilateral legs red and swollen. Pt a.o, nad noted

## 2019-04-09 NOTE — ED Provider Notes (Signed)
[facility] EMERGENCY DEPARTMENT Provider Note  CSN: [id number] Arrival date & time: 04/09/19 2053  Chief Complaint(s) Chest Pain, Leg Pain, and Shortness of Breath  HPI [name] is a 53 y.o. female    Chest Pain Pain location:  Substernal area Pain quality: pressure   Pain radiates to:  L shoulder Pain severity:  Moderate Onset quality:  Gradual Duration:  3 days Timing:  Constant Progression:  Waxing and waning Chronicity:  Recurrent Context: movement and at rest   Relieved by:  Rest Worsened by:  Exertion Associated symptoms: cough, lower extremity edema, orthopnea and shortness of breath   Associated symptoms: no anxiety and no fever    Currently chest pain-free.  Reports that she missed her dose of Lasix tonight and Eliquis.   Past Medical History Past Medical History:  Diagnosis Date  . Acute on chronic diastolic congestive heart failure ([place]) 11/02/2013   10/03/2015, 11/13/2015, 08/03/2017  . Benign essential HTN 11/28/2013  . Bipolar disease, chronic ([place])   . Chest pain    a. 2012 Myoview: EF 63%, no isch/infarct;  b. 04/2016 Lexiscan MV: EF 73%, no ischemia/infarct-->Low risk.  . Chronic diastolic CHF (congestive heart failure) ([place]) 07/23/2011   a. 2015 Echo: EF 55-60%, Gr2 DD;  b. 09/2015 Echo: EF 60-65%, no rwma, mod dil LA, PASP 5mHg.  . Cor pulmonale (chronic) ([place]   . History of thyrotoxicosis   . HTN (hypertension) 11/28/2013  . Hypertensive heart disease 10/18/2013  . Hypoglycemia   . Insulin dependent type 2 diabetes mellitus, uncontrolled ([place]   . Mediastinal adenopathy   . Morbid obesity due to excess calories ([place] 02/19/2011  . Morbid obesity with BMI of 50.0-59.9, adult ([place]   . OSA (obstructive sleep apnea) 03/06/2011  . Persistent atrial fibrillation 12/09/2017  . Pulmonary HTN, moderate to severe 11/03/2013  . Sinusitis, chronic 01/02/2015  . SVT (supraventricular tachycardia) ([place] 12/06/2013  . Uncontrolled type 2  diabetes mellitus with hyperglycemia ([facility]    Patient Active Problem List   Diagnosis Date Noted  . AKI (acute kidney injury) ([facility]   . Acute CHF (congestive heart failure) ([place] 08/03/2018  . A-fib ([place] 08/03/2018  . Vitamin B12 deficiency 07/20/2018  . Hematochezia 07/14/2018  . Acute posthemorrhagic anemia 07/14/2018  . GIB (gastrointestinal bleeding) 07/14/2018  . Chronic anticoagulation 07/14/2018  . Chronic respiratory failure with hypoxia ([place] 05/06/2018  . Persistent atrial fibrillation   . Uncontrolled type 2 diabetes mellitus with hyperglycemia ([place]   . Hypokalemia   . Cor pulmonale (chronic) ([place]   . Mycobacterium avium complex ([facility] 12/13/2015  . Pyrexia   . Dyspnea 11/13/2015  . Mediastinal adenopathy 11/13/2015  . Acute on chronic diastolic heart failure ([place] 11/13/2015  . Abnormal CT scan, chest 11/11/2015  . Chest pain 11/11/2015  . Essential hypertension 03/07/2015  . Depression (emotion) 03/07/2015  . Noninfectious gastroenteritis and colitis 01/02/2015  . Sinusitis, chronic 01/02/2015  . Midline low back pain without sciatica 09/10/2014  . Bipolar 1 disorder, mixed, moderate ([place] 07/02/2014  . Stress incontinence 07/02/2014  . Mania ([place] 12/10/2013  . Speech abnormality 12/08/2013  . SVT (supraventricular tachycardia) ([facility] 12/06/2013  . Benign essential HTN 11/28/2013  . HTN (hypertension) 11/28/2013  . Pulmonary HTN, moderate to severe 11/03/2013  . Acute on chronic diastolic congestive heart failure ([facility] 11/02/2013  . Hypertensive heart disease 10/18/2013  . Chronic diastolic heart failure ([facility]De[place] 07/23/2011  . OSA (obstructive sleep apnea)- non compliant with C-pap 03/06/2011  . Morbid obesity (  [place]) 02/19/2011  . Non-ST elevation (NSTEMI) myocardial infarction ([place]) 04/29/2009  . Bipolar disorder    Home Medication(s) Prior to Admission medications   Medication Sig Start Date End Date Taking? Authorizing Provider  albuterol (PROAIR HFA) 108  (90 Base) MCG/ACT inhaler INHALE 1 TO 2 PUFFS EVERY 6 HOURS AS NEEDED FOR WHEEZING/ SHORTNESS OF BREATH Patient taking differently: Inhale 1-2 puffs into the lungs every 6 (six) hours as needed for wheezing.  01/27/19  Yes [name], NP  amLODipine (NORVASC) 10 MG tablet Take 1 tablet (10 mg total) by mouth daily. 03/14/19 06/12/19 Yes Lelon Perla, MD  atorvastatin (LIPITOR) 40 MG tablet TAKE 1 TABLET (40 MG TOTAL) BY MOUTH DAILY AT 6 PM. 02/23/19  Yes [name], NP  carvedilol (COREG) 25 MG tablet Take 1 tablet (25 mg total) by mouth 2 (two) times daily. 08/16/18 04/09/28 Yes [name], NP  ferrous sulfate (FEROSUL) 325 (65 FE) MG tablet Take 1 tablet (325 mg total) by mouth 2 (two) times daily. 10/04/18  Yes Kilroy, Luke K, PA-C  furosemide (LASIX) 40 MG tablet Take 2 tablets (80 mg total) by mouth 2 (two) times daily. 11/28/18  Yes Lelon Perla, MD  glipiZIDE (GLIPIZIDE XL) 10 MG 24 hr tablet Take 1 tablet (10 mg total) by mouth daily with breakfast. 08/16/18 04/09/28 Yes [name], NP  losartan (COZAAR) 100 MG tablet Take 1 tablet (100 mg total) by mouth daily. 10/04/18 04/09/28 Yes Kilroy, Luke K, PA-C  metFORMIN (GLUCOPHAGE) 1000 MG tablet TAKE 1 TABLET (1,000 MG TOTAL) BY MOUTH 2 (TWO) TIMES DAILY WITH A MEAL. 03/30/19  Yes [name], NP  oxybutynin (DITROPAN XL) 15 MG 24 hr tablet Take 1 tablet (15 mg total) by mouth at bedtime. 08/16/18  Yes [name], NP  Potassium Chloride ER 20 MEQ TBCR Take 20 mEq by mouth 2 (two) times daily. 11/16/18  Yes Lelon Perla, MD  risperidone (RISPERDAL) 4 MG tablet TAKE 1 TABLET BY MOUTH DAILY. Patient taking differently: Take 4 mg by mouth daily.  03/30/19  Yes Fleming, Vernia Buff, NP  XARELTO 20 MG TABS tablet TAKE 1 TABLET (20 MG TOTAL) BY MOUTH DAILY WITH SUPPER. Patient taking differently: Take 20 mg by mouth daily with supper.  02/27/19  Yes [name], NP  ACCU-CHEK SOFTCLIX LANCETS lancets USE AS DIRECTED TO  TEST BLOOD SUGAR ONCE DAILY 08/23/18   [name], NP  Blood Glucose Monitoring Suppl (ACCU-CHEK AVIVA PLUS) w/Device KIT 1 each by Does not apply route daily. USE AS DIRECTED TO TEST BLOOD SUGAR ONCE DAILY 08/23/18   [name], NP  Blood Glucose Monitoring Suppl (TRUE METRIX METER) w/Device KIT Use as instructed. Check blood glucose levels by fingerstick twice per day 08/16/18   [name], NP  glucose blood (ACCU-CHEK AVIVA PLUS) test strip USE AS DIRECTED TO TEST BLOOD SUGAR ONCE DAILY 08/23/18   [name], NP  Misc. Devices MISC Please provide patient with: BiPAP therapy on 20/16 cm H2O Medium size Resmed Full Face Mask AirFit 20 for mask and heated humidification 09/16/18   [name], NP  TRUEPLUS LANCETS 28G MISC Use as instructed. Check blood glucose levels by fingerstick twice per day 08/16/18   [name], NP  Past Surgical History Past Surgical History:  Procedure Laterality Date  . CARDIOVERSION N/A 04/05/2018   Procedure: CARDIOVERSION;  Surgeon: Lelon Perla, MD;  Location: [facility] ENDOSCOPY;  Service: Cardiovascular;  Laterality: N/A;  . COLONOSCOPY WITH PROPOFOL Left 07/16/2018   Procedure: COLONOSCOPY WITH PROPOFOL;  Surgeon: Ronnette Juniper, MD;  Location: [facility] ENDOSCOPY;  Service: Gastroenterology;  Laterality: Left;  . LEFT HEART CATH AND CORONARY ANGIOGRAPHY N/A 08/04/2018   Procedure: LEFT HEART CATH AND CORONARY ANGIOGRAPHY;  Surgeon: Belva Crome, MD;  Location: [facility] CV LAB;  Service: Cardiovascular;  Laterality: N/A;  . None    . POLYPECTOMY  07/16/2018   Procedure: POLYPECTOMY;  Surgeon: Ronnette Juniper, MD;  Location: [facility] ENDOSCOPY;  Service: Gastroenterology;;   Family History Family History  Problem Relation Age of Onset  . Heart failure Father   . Stroke Father   . Hypertension Mother   . Heart  disease Maternal Grandfather     Social History Social History   Tobacco Use  . Smoking status: Never Smoker  . Smokeless tobacco: Never Used  Substance Use Topics  . Alcohol use: No  . Drug use: No   Allergies Acetaminophen, Caffeine, Farxiga [dapagliflozin], and Lisinopril  Review of Systems Review of Systems  Constitutional: Negative for fever.  Respiratory: Positive for cough and shortness of breath.   Cardiovascular: Positive for orthopnea.   All other systems are reviewed and are negative for acute change except as noted in the HPI  Physical Exam Vital Signs  I have reviewed the triage vital signs BP (!) 162/110   Pulse (!) 108   Temp 98 F (36.7 C) (Oral)   Resp 20   SpO2 99%   Physical Exam Vitals signs reviewed.  Constitutional:      General: She is not in acute distress.    Appearance: She is well-developed. She is obese. She is not diaphoretic.  HENT:     Head: Normocephalic and atraumatic.     Nose: Nose normal.  Eyes:     General: No scleral icterus.       Right eye: No discharge.        Left eye: No discharge.     Conjunctiva/sclera: Conjunctivae normal.     Pupils: Pupils are equal, round, and reactive to light.  Neck:     Musculoskeletal: Normal range of motion and neck supple.  Cardiovascular:     Rate and Rhythm: Tachycardia present. Rhythm irregularly irregular.     Heart sounds: No murmur. No friction rub. No gallop.   Pulmonary:     Effort: Pulmonary effort is normal. Tachypnea present. No respiratory distress.     Breath sounds: Normal breath sounds. No stridor. No wheezing, rhonchi or rales.  Abdominal:     General: There is no distension.     Palpations: Abdomen is soft.     Tenderness: There is no abdominal tenderness.  Musculoskeletal:        General: No tenderness.  Skin:    General: Skin is warm and dry.     Findings: No erythema or rash.  Neurological:     Mental Status: She is alert and oriented to person, place, and  time.     ED Results and Treatments Labs (all labs ordered are listed, but only abnormal results are displayed) Labs Reviewed  BASIC METABOLIC PANEL - Abnormal; Notable for the following components:      Result Value   Glucose, Bld 231 (*)    All other components  within normal limits  CBC - Abnormal; Notable for the following components:   Hemoglobin 11.3 (*)    All other components within normal limits  BRAIN NATRIURETIC PEPTIDE - Abnormal; Notable for the following components:   B Natriuretic Peptide 372.3 (*)    All other components within normal limits  I-STAT BETA HCG BLOOD, ED ([facility], [facility], [facility] ONLY)  TROPONIN I (HIGH SENSITIVITY)  TROPONIN I (HIGH SENSITIVITY)                                                                                                                         EKG  EKG Interpretation  Date/Time:  Sunday April 09 2019 21:07:10 EDT Ventricular Rate:  100 PR Interval:    QRS Duration: 84 QT Interval:  354 QTC Calculation: 456 R Axis:   111 Text Interpretation:  Atrial fibrillation Left posterior fascicular block Anterior infarct , age undetermined Abnormal ECG Confirmed by Addison Lank [phone]) on 04/09/2019 11:48:57 PM      Radiology Dg Chest 2 View  Result Date: 04/09/2019 CLINICAL DATA:  Chest pain.  Shortness of breath. EXAM: CHEST - 2 VIEW COMPARISON:  August 03, 2018 FINDINGS: Cardiomegaly. No overt edema. No nodule, mass, or focal infiltrate. No change in the hila or mediastinum. IMPRESSION: No acute abnormalities.  Stable cardiomegaly. Electronically Signed   By: Dorise Bullion III M.D   On: 04/09/2019 21:24    Pertinent labs & imaging results that were available during my care of the patient were reviewed by me and considered in my medical decision making (see chart for details).  Medications Ordered in ED Medications  sodium chloride flush (NS) 0.9 % injection 3 mL (has no administration in time range)  furosemide (LASIX) injection 60 mg (60  mg Intravenous Given 04/10/19 0041)  rivaroxaban (XARELTO) tablet 20 mg (20 mg Oral Given 04/10/19 0130)                                                                                                                                    Procedures Procedures  (including critical care time)  Medical Decision Making / ED Course I have reviewed the nursing notes for this encounter and the patient's prior records (if available in EHR or on provided paperwork).   FGHWE Quebedeaux was evaluated in Emergency Department on 04/10/2019 for the symptoms described in the history of present illness. She was evaluated in the  context of the global COVID-19 pandemic, which necessitated consideration that the patient might be at risk for infection with the SARS-CoV-2 virus that causes COVID-19. Institutional protocols and algorithms that pertain to the evaluation of patients at risk for COVID-19 are in a state of rapid change based on information released by regulatory bodies including the CDC and federal and state organizations. These policies and algorithms were followed during the patient's care in the ED.  Patient presents with atypical chest pain.  EKG with atrial fibrillation without acute ischemic changes.  On exam patient has evidence of volume overload.  However she is satting well on room air.  She does have increased work of breathing.  Chest x-ray with cardiomegaly but no evidence of pulmonary edema, pneumonia, pneumothorax or pleural effusions.  Low suspicion for pulmonary embolism.  Presentation not classic for aortic dissection or esophageal perforation.  Suspicious for CHF exacerbation.  Doubt ACS.  High-sensitivity troponins negative x2.  BNP mildly elevated at 372.  Renal function intact.  Patient was given a dose of IV Lasix and her oral dose of Xarelto.  She was able to diuresis well.  Was able to ambulate without increased work of breathing or the satting.  Feel she is appropriate for outpatient  management.  Will adjust Lasix dose for 1 week and have her follow-up with her cardiologist.      Final Clinical Impression(s) / ED Diagnoses Final diagnoses:  Acute on chronic diastolic congestive heart failure ([place])  Lower extremity edema     The patient appears reasonably screened and/or stabilized for discharge and I doubt any other medical condition or other [facility] requiring further screening, evaluation, or treatment in the ED at this time prior to discharge.  Disposition: Discharge  Condition: Good  I have discussed the results, Dx and Tx plan with the patient who expressed understanding and agree(s) with the plan. Discharge instructions discussed at great length. The patient was given strict return precautions who verbalized understanding of the instructions. No further questions at time of discharge.    ED Discharge Orders    None       Follow Up: Lelon Perla, [address] [place] [address] [phone]  Schedule an appointment as soon as possible for a visit       This chart was dictated using voice recognition software.  Despite best efforts to proofread,  errors can occur which can change the documentation meaning.   Fatima Blank, MD 04/10/19 [phone]

## 2019-04-10 LAB — TROPONIN I (HIGH SENSITIVITY): Troponin I (High Sensitivity): 3 ng/L (ref <18)

## 2019-04-10 LAB — BRAIN NATRIURETIC PEPTIDE: B Natriuretic Peptide: 372.3 pg/mL — ABNORMAL HIGH (ref 0.0–100.0)

## 2019-04-10 NOTE — ED Notes (Signed)
PT states understanding of care given, follow up care. PT ambulated from ED to car with a steady gait.  

## 2019-04-10 NOTE — ED Notes (Addendum)
Pt walked in room with no assistance, O2 stayed between 95-98.

## 2019-04-10 NOTE — Discharge Instructions (Addendum)
Increase her Lasix dose for 1 week.  Take 40 mg in the morning, 20 mg in the afternoon, and 40 mg at night.  Contact your cardiologist to set up a close follow-up appointment.

## 2019-04-27 ENCOUNTER — Other Ambulatory Visit: Payer: Self-pay | Admitting: Nurse Practitioner

## 2019-04-27 DIAGNOSIS — F3162 Bipolar disorder, current episode mixed, moderate: Secondary | ICD-10-CM

## 2019-04-27 DIAGNOSIS — I4819 Other persistent atrial fibrillation: Secondary | ICD-10-CM

## 2019-04-27 MED FILL — FUROSEMIDE 40 MG TAB: 40 | 30 days supply | Qty: 120 | Fill #2

## 2019-05-01 MED FILL — risperiDONE 4 MG TABS: 4 | 30 days supply | Qty: 30 | Fill #0

## 2019-05-01 MED FILL — XARELTO 20 MG TABLET: 20 | 30 days supply | Qty: 30 | Fill #0

## 2019-05-23 ENCOUNTER — Other Ambulatory Visit: Payer: Self-pay

## 2019-05-23 ENCOUNTER — Encounter: Payer: Self-pay | Admitting: Pharmacist

## 2019-05-23 ENCOUNTER — Ambulatory Visit: Payer: Medicaid Other | Attending: Nurse Practitioner | Admitting: Nurse Practitioner

## 2019-05-23 ENCOUNTER — Ambulatory Visit (HOSPITAL_BASED_OUTPATIENT_CLINIC_OR_DEPARTMENT_OTHER): Payer: Medicaid Other | Admitting: Pharmacist

## 2019-05-23 ENCOUNTER — Encounter: Payer: Self-pay | Admitting: Nurse Practitioner

## 2019-05-23 DIAGNOSIS — F319 Bipolar disorder, unspecified: Secondary | ICD-10-CM | POA: Insufficient documentation

## 2019-05-23 DIAGNOSIS — Z1331 Encounter for screening for depression: Secondary | ICD-10-CM | POA: Diagnosis not present

## 2019-05-23 DIAGNOSIS — Z7984 Long term (current) use of oral hypoglycemic drugs: Secondary | ICD-10-CM | POA: Diagnosis not present

## 2019-05-23 DIAGNOSIS — Z8249 Family history of ischemic heart disease and other diseases of the circulatory system: Secondary | ICD-10-CM | POA: Diagnosis not present

## 2019-05-23 DIAGNOSIS — I10 Essential (primary) hypertension: Secondary | ICD-10-CM | POA: Diagnosis not present

## 2019-05-23 DIAGNOSIS — J849 Interstitial pulmonary disease, unspecified: Secondary | ICD-10-CM

## 2019-05-23 DIAGNOSIS — F3162 Bipolar disorder, current episode mixed, moderate: Secondary | ICD-10-CM | POA: Diagnosis not present

## 2019-05-23 DIAGNOSIS — Z79899 Other long term (current) drug therapy: Secondary | ICD-10-CM | POA: Diagnosis not present

## 2019-05-23 DIAGNOSIS — G4733 Obstructive sleep apnea (adult) (pediatric): Secondary | ICD-10-CM | POA: Diagnosis not present

## 2019-05-23 DIAGNOSIS — Z6841 Body Mass Index (BMI) 40.0 and over, adult: Secondary | ICD-10-CM | POA: Insufficient documentation

## 2019-05-23 DIAGNOSIS — Z23 Encounter for immunization: Secondary | ICD-10-CM

## 2019-05-23 DIAGNOSIS — Z7901 Long term (current) use of anticoagulants: Secondary | ICD-10-CM | POA: Insufficient documentation

## 2019-05-23 DIAGNOSIS — I5032 Chronic diastolic (congestive) heart failure: Secondary | ICD-10-CM | POA: Diagnosis not present

## 2019-05-23 DIAGNOSIS — I4819 Other persistent atrial fibrillation: Secondary | ICD-10-CM | POA: Diagnosis not present

## 2019-05-23 DIAGNOSIS — I110 Hypertensive heart disease with heart failure: Secondary | ICD-10-CM | POA: Insufficient documentation

## 2019-05-23 DIAGNOSIS — I208 Other forms of angina pectoris: Secondary | ICD-10-CM

## 2019-05-23 DIAGNOSIS — N393 Stress incontinence (female) (male): Secondary | ICD-10-CM | POA: Diagnosis not present

## 2019-05-23 DIAGNOSIS — E1165 Type 2 diabetes mellitus with hyperglycemia: Secondary | ICD-10-CM | POA: Diagnosis not present

## 2019-05-23 DIAGNOSIS — I2089 Other forms of angina pectoris: Secondary | ICD-10-CM

## 2019-05-23 LAB — POCT GLYCOSYLATED HEMOGLOBIN (HGB A1C): Hemoglobin A1C: 8.1 % — AB (ref 4.0–5.6)

## 2019-05-23 LAB — GLUCOSE, POCT (MANUAL RESULT ENTRY): POC Glucose: 162 mg/dl — AB (ref 70–99)

## 2019-05-23 MED ORDER — ACCU-CHEK SOFTCLIX LANCETS MISC: 12 refills | Status: DC

## 2019-05-23 MED ORDER — METFORMIN HCL 1000 MG PO TABS: 1000.0000 mg | ORAL_TABLET | Freq: Two times a day (BID) | ORAL | 2 refills | Status: DC

## 2019-05-23 MED ORDER — GLIPIZIDE ER 10 MG PO TB24: 10.0000 mg | ORAL_TABLET | Freq: Every day | ORAL | 2 refills | Status: DC

## 2019-05-23 MED ORDER — POTASSIUM CHLORIDE ER 20 MEQ PO TBCR: 20.0000 meq | EXTENDED_RELEASE_TABLET | Freq: Two times a day (BID) | ORAL | 11 refills | Status: DC

## 2019-05-23 MED ORDER — ACCU-CHEK AVIVA PLUS VI STRP: ORAL_STRIP | 12 refills | Status: DC

## 2019-05-23 MED ORDER — LOSARTAN POTASSIUM 100 MG PO TABS: 100.0000 mg | ORAL_TABLET | Freq: Every day | ORAL | 3 refills | Status: DC

## 2019-05-23 MED ORDER — RISPERIDONE 4 MG PO TABS: 4.0000 mg | ORAL_TABLET | Freq: Every day | ORAL | 1 refills | Status: DC

## 2019-05-23 MED ORDER — AMLODIPINE BESYLATE 10 MG PO TABS: 10.0000 mg | ORAL_TABLET | Freq: Every day | ORAL | 3 refills | Status: DC

## 2019-05-23 MED ORDER — OXYBUTYNIN CHLORIDE ER 15 MG PO TB24: 15.0000 mg | ORAL_TABLET | Freq: Every day | ORAL | 3 refills | Status: DC

## 2019-05-23 MED ORDER — CARVEDILOL 25 MG PO TABS: 25.0000 mg | ORAL_TABLET | Freq: Two times a day (BID) | ORAL | 3 refills | Status: DC

## 2019-05-23 MED ORDER — FUROSEMIDE 40 MG PO TABS: 80.0000 mg | ORAL_TABLET | Freq: Two times a day (BID) | ORAL | 6 refills | Status: DC

## 2019-05-23 MED ORDER — ATORVASTATIN CALCIUM 40 MG PO TABS: 40.0000 mg | ORAL_TABLET | Freq: Every day | ORAL | 0 refills | Status: DC

## 2019-05-23 MED ORDER — RIVAROXABAN 20 MG PO TABS: ORAL_TABLET | ORAL | 1 refills | Status: DC

## 2019-05-23 MED FILL — metFORMIN HCL 1000 MG TABS: 1000 | 30 days supply | Qty: 60 | Fill #1

## 2019-05-23 MED FILL — glipiZIDE XL 10 MG TB24: 10 | 90 days supply | Qty: 90 | Fill #2

## 2019-05-23 MED FILL — OXYBUTYNIN CL ER 15 MG TAB: 15 | 90 days supply | Qty: 90 | Fill #7

## 2019-05-23 MED FILL — FUROSEMIDE 40 MG TAB: 40 | 30 days supply | Qty: 120 | Fill #3

## 2019-05-23 MED FILL — ACCU-CHEK SOFTCLIX LANCETS: 100 days supply | Qty: 100 | Fill #0

## 2019-05-23 MED FILL — XARELTO 20 MG TABLET: 20 | 30 days supply | Qty: 30 | Fill #1

## 2019-05-23 MED FILL — AMLODIPINE BESYLATE 5 MG TA: 5 | 90 days supply | Qty: 90 | Fill #4

## 2019-05-23 MED FILL — POTASSIUM CL ER 20 MEQ TAB: 20 | 90 days supply | Qty: 180 | Fill #3

## 2019-05-23 MED FILL — LOSARTAN POTASSIUM 100 MG T: 100 | 90 days supply | Qty: 90 | Fill #5

## 2019-05-23 MED FILL — ACCU-CHEK AVIVA PLUS STRP: 100 days supply | Qty: 100 | Fill #0

## 2019-05-23 NOTE — Progress Notes (Signed)
Patient presents for vaccination against influenza and pneumonia per orders of [name]. Consent given. Counseling provided. No contraindications exists. Vaccine administered without incident.

## 2019-05-23 NOTE — Progress Notes (Signed)
Assessment & Plan:  Kadi was seen today for follow-up.  Diagnoses and all orders for this visit:  Uncontrolled type 2 diabetes mellitus with hyperglycemia (HCC) -     Glucose (CBG) -     HgB A1c -     atorvastatin (LIPITOR) 40 MG tablet; Take 1 tablet (40 mg total) by mouth daily at 6 PM. -     glipiZIDE (GLIPIZIDE XL) 10 MG 24 hr tablet; Take 1 tablet (10 mg total) by mouth daily with breakfast. -     metFORMIN (GLUCOPHAGE) 1000 MG tablet; Take 1 tablet (1,000 mg total) by mouth 2 (two) times daily with a meal. -     glucose blood (ACCU-CHEK AVIVA PLUS) test strip; USE AS DIRECTED TO TEST BLOOD SUGAR ONCE DAILY -     Accu-Chek Softclix Lancets lancets; USE AS DIRECTED TO TEST BLOOD SUGAR ONCE DAILY -     Lipid panel Continue blood sugar control as discussed in office today, low carbohydrate diet, and regular physical exercise as tolerated, 150 minutes per week (30 min each day, 5 days per week, or 50 min 3 days per week). Keep blood sugar logs with fasting goal of 90-130 mg/dl, post prandial (after you eat) less than 180.  For Hypoglycemia: BS <60 and Hyperglycemia BS >400; contact the clinic ASAP. Annual eye exams and foot exams are recommended.  Stress incontinence in female -     oxybutynin (DITROPAN XL) 15 MG 24 hr tablet; Take 1 tablet (15 mg total) by mouth at bedtime. CONTROLLED  Essential hypertension -     amLODipine (NORVASC) 10 MG tablet; Take 1 tablet (10 mg total) by mouth daily. -     carvedilol (COREG) 25 MG tablet; Take 1 tablet (25 mg total) by mouth 2 (two) times daily. -     losartan (COZAAR) 100 MG tablet; Take 1 tablet (100 mg total) by mouth daily. -     Potassium Chloride ER 20 MEQ TBCR; Take 20 mEq by mouth 2 (two) times daily. Continue all antihypertensives as prescribed.  Remember to bring in your blood pressure log with you for your follow up appointment.  DASH/Mediterranean Diets are healthier choices for HTN.    Chronic diastolic heart failure  (HCC) -     carvedilol (COREG) 25 MG tablet; Take 1 tablet (25 mg total) by mouth 2 (two) times daily. -     furosemide (LASIX) 40 MG tablet; Take 2 tablets (80 mg total) by mouth 2 (two) times daily.  Bipolar 1 disorder, mixed, moderate (HCC) -     risperidone (RISPERDAL) 4 MG tablet; Take 1 tablet (4 mg total) by mouth daily.  Persistent atrial fibrillation -     rivaroxaban (XARELTO) 20 MG TABS tablet; TAKE 1 TABLET (20 MG TOTAL) BY MOUTH DAILY WITH SUPPER. Continue follow up with Cardiology   Patient has been counseled on age-appropriate routine health concerns for screening and prevention. These are reviewed and up-to-date. Referrals have been placed accordingly. Immunizations are up-to-date or declined.    Subjective:   Chief Complaint  Patient presents with   Follow-up   HPI Taila [age] y.o. female presents to office today for follow up.  has a past medical history of Acute on chronic diastolic congestive heart failure ([place]) (11/02/2013), Benign essential HTN (11/28/2013), Bipolar disease, chronic ([place]), Chest pain, Chronic diastolic CHF (congestive heart failure) ([place]) (07/23/2011), Cor pulmonale (chronic) ([place]), History of thyrotoxicosis, HTN (hypertension) (11/28/2013), Hypertensive heart disease (10/18/2013), Hypoglycemia, Insulin dependent type 2 diabetes  mellitus, uncontrolled ([place]), Mediastinal adenopathy, Morbid obesity due to excess calories ([place]) (02/19/2011), Morbid obesity with BMI of 50.0-59.9, adult ([place]), OSA (obstructive sleep apnea) (03/06/2011), Persistent atrial fibrillation (12/09/2017), Pulmonary HTN, moderate to severe (11/03/2013), Sinusitis, chronic (01/02/2015), SVT (supraventricular tachycardia) ([place]) (12/06/2013), and Uncontrolled type 2 diabetes mellitus with hyperglycemia ([place]).  DM TYPE 2 Her highest A1c 10.2. Lowest  7.1 and currently 8.1. She is not consistent with her taking her medications which include: glipizide 10 mg daily and metformin 1000 mg BID,   Taking an ARB and statin.  Lab Results  Component Value Date   HGBA1C 8.1 (A) 05/23/2019   Although Celia's PHQ9 is normal I do not believe she is forthcoming with her depression. 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 Since that time Estefania's weight has significantly increased, her mother states she does not leave the home for social events nor does she have any friends that she can go out with. She mostly sits in the house, watching TV and mostly sedentary. She does have a history of bipolar disorder for which she takes risperdal. She does not see a psychiatrist and refused referral. She is noncompliant with many of her medications. She will take them based on her mood.   Depression screen [facility] 2/9 05/23/2019 09/16/2018 08/16/2018 07/20/2018 06/21/2018  Decreased Interest 0 0 0 0 0  Down, Depressed, Hopeless 0 0 0 0 0  PHQ - 2 Score 0 0 0 0 0  Altered sleeping 0 0 0 0 0  Tired, decreased energy 0 0 1 0 0  Change in appetite 0 1 0 1 1  Feeling bad or failure about yourself  0 0 0 0 0  Trouble concentrating 0 0 0 0 0  Moving slowly or fidgety/restless 0 0 0 0 0  Suicidal thoughts 0 0 0 0 0  PHQ-9 Score 0 '1 1 1 1  ' Some recent data might be hidden    Essential Hypertension Blood pressure is well controlled. Medications include carvedilol 25 mg BID, losartan 100 mg daily and amlodipine 41m daily. She does not monitor her blood pressure at home. Denies chest pain, headaches or BLE edema.  BP Readings from Last 3 Encounters:  05/23/19 134/87  04/10/19 130/87  03/14/19 (!) 147/103     Review of Systems  Constitutional: Negative for fever, malaise/fatigue and weight loss.  HENT: Negative.  Negative for nosebleeds.   Eyes: Negative.  Negative for blurred vision, double vision and photophobia.  Respiratory: Negative.  Negative for cough and shortness of breath (with moderate exertion, climbing stair,  prolonged walking).        OSA: noncompliant with CPAP  Cardiovascular: Negative.  Negative for chest pain, palpitations and leg swelling.  Gastrointestinal: Negative.  Negative for heartburn, nausea and vomiting.  Musculoskeletal: Negative.  Negative for myalgias.  Neurological: Negative.  Negative for dizziness, focal weakness, seizures and headaches.  Psychiatric/Behavioral: Positive for depression. Negative for suicidal ideas.    Past Medical History:  Diagnosis Date   Acute on chronic diastolic congestive heart failure ([place] 11/02/2013   10/03/2015, 11/13/2015, 08/03/2017   Benign essential HTN 11/28/2013   Bipolar disease, chronic ([place]    Chest pain    a. 2012 Myoview: EF 63%, no isch/infarct;  b. 04/2016 Lexiscan MV: EF 73%, no ischemia/infarct-->Low risk.   Chronic diastolic CHF (congestive heart failure) ([facility] 07/23/2011   a. 2015 Echo: EF  55-60%, Gr2 DD;  b. 09/2015 Echo: EF 60-65%, no rwma, mod dil LA, PASP 43mHg.   Cor pulmonale (chronic) (HCC)    History of thyrotoxicosis    HTN (hypertension) 11/28/2013   Hypertensive heart disease 10/18/2013   Hypoglycemia    Insulin dependent type 2 diabetes mellitus, uncontrolled ([place]    Mediastinal adenopathy    Morbid obesity due to excess calories ([place] 02/19/2011   Morbid obesity with BMI of 50.0-59.9, adult (HCC)    OSA (obstructive sleep apnea) 03/06/2011   Persistent atrial fibrillation 12/09/2017   Pulmonary HTN, moderate to severe 11/03/2013   Sinusitis, chronic 01/02/2015   SVT (supraventricular tachycardia) ([place] 12/06/2013   Uncontrolled type 2 diabetes mellitus with hyperglycemia ([facility]     Past Surgical History:  Procedure Laterality Date   CARDIOVERSION N/A 04/05/2018   Procedure: CARDIOVERSION;  Surgeon: CLelon Perla MD;  [facility]  Service: Cardiovascular;  Laterality: N/A;   COLONOSCOPY WITH PROPOFOL Left 07/16/2018   Procedure: COLONOSCOPY WITH PROPOFOL;  Surgeon: KRonnette Juniper MD;   [facility];  Service: Gastroenterology;  Laterality: Left;   LEFT HEART CATH AND CORONARY ANGIOGRAPHY N/A 08/04/2018   Procedure: LEFT HEART CATH AND CORONARY ANGIOGRAPHY;  Surgeon: SBelva Crome MD;  [facility];  Service: Cardiovascular;  Laterality: N/A;   None     POLYPECTOMY  07/16/2018   Procedure: POLYPECTOMY;  Surgeon: KRonnette Juniper MD;  [facility];  Service: Gastroenterology;;    Family History  Problem Relation Age of Onset   Heart failure Father    Stroke Father    Hypertension Mother    Heart disease Maternal Grandfather     Social History Reviewed with no changes to be made today.   Outpatient Medications Prior to Visit  Medication Sig Dispense Refill   albuterol (PROAIR HFA) 108 (90 Base) MCG/ACT inhaler INHALE 1 TO 2 PUFFS EVERY 6 HOURS AS NEEDED FOR WHEEZING/ SHORTNESS OF BREATH (Patient taking differently: Inhale 1-2 puffs into the lungs every 6 (six) hours as needed for wheezing. ) 8.5 g 1   Blood Glucose Monitoring Suppl (ACCU-CHEK AVIVA PLUS) w/Device KIT 1 each by Does not apply route daily. USE AS DIRECTED TO TEST BLOOD SUGAR ONCE DAILY 1 kit 0   Blood Glucose Monitoring Suppl (TRUE METRIX METER) w/Device KIT Use as instructed. Check blood glucose levels by fingerstick twice per day 1 kit 0   ACCU-CHEK SOFTCLIX LANCETS lancets USE AS DIRECTED TO TEST BLOOD SUGAR ONCE DAILY 100 each 12   amLODipine (NORVASC) 10 MG tablet Take 1 tablet (10 mg total) by mouth daily. 90 tablet 3   carvedilol (COREG) 25 MG tablet Take 1 tablet (25 mg total) by mouth 2 (two) times daily. 180 tablet 3   furosemide (LASIX) 40 MG tablet Take 2 tablets (80 mg total) by mouth 2 (two) times daily. 120 tablet 6   glipiZIDE (GLIPIZIDE XL) 10 MG 24 hr tablet Take 1 tablet (10 mg total) by mouth daily with breakfast. 90 tablet 2   glucose blood (ACCU-CHEK AVIVA PLUS) test strip USE AS DIRECTED TO TEST BLOOD SUGAR ONCE DAILY 100 each 12    losartan (COZAAR) 100 MG tablet Take 1 tablet (100 mg total) by mouth daily. 90 tablet 3   metFORMIN (GLUCOPHAGE) 1000 MG tablet TAKE 1 TABLET (1,000 MG TOTAL) BY MOUTH 2 (TWO) TIMES DAILY WITH A MEAL. 60 tablet 2   oxybutynin (DITROPAN XL) 15 MG 24 hr tablet Take 1 tablet (15 mg  total) by mouth at bedtime. 90 tablet 3   Potassium Chloride ER 20 MEQ TBCR Take 20 mEq by mouth 2 (two) times daily. 60 tablet 11   risperidone (RISPERDAL) 4 MG tablet TAKE 1 TABLET BY MOUTH DAILY. 30 tablet 0   TRUEPLUS LANCETS 28G MISC Use as instructed. Check blood glucose levels by fingerstick twice per day 100 each 3   XARELTO 20 MG TABS tablet TAKE 1 TABLET (20 MG TOTAL) BY MOUTH DAILY WITH SUPPER. 30 tablet 1   ferrous sulfate (FEROSUL) 325 (65 FE) MG tablet Take 1 tablet (325 mg total) by mouth 2 (two) times daily. (Patient not taking: Reported on 05/23/2019) 60 tablet 3   Misc. Devices MISC Please provide patient with: BiPAP therapy on 20/16 cm H2O Medium size Resmed Full Face Mask AirFit 20 for mask and heated humidification (Patient not taking: Reported on 05/23/2019) 1 each 0   atorvastatin (LIPITOR) 40 MG tablet TAKE 1 TABLET (40 MG TOTAL) BY MOUTH DAILY AT 6 PM. (Patient not taking: Reported on 05/23/2019) 90 tablet 0   No facility-administered medications prior to visit.     Allergies  Allergen Reactions   Acetaminophen Other (See Comments)    Seizure-like "fits" as a child   Caffeine     Tense, anxiety, increased urination   [place] [Dapagliflozin] Other (See Comments)    Hallucinations, drop in blood sugar   Lisinopril Rash    Rash with lisinopril; but fosinopril is ok per patient       Objective:    BP 134/87 (BP Location: Right Arm, Patient Position: Sitting, Cuff Size: Large)    Pulse 92    Temp 98.8 F (37.1 C) (Oral)    Ht '5\' 7"'  (1.702 m)    Wt (!) 347 lb (157.4 kg)    SpO2 95%    BMI 54.35 kg/m  Wt Readings from Last 3 Encounters:  05/23/19 (!) 347 lb (157.4 kg)  03/14/19  (!) 333 lb (151 kg)  11/28/18 (!) 334 lb (151.5 kg)    Physical Exam Vitals signs and nursing note reviewed.  Constitutional:      Appearance: She is well-developed.  HENT:     Head: Normocephalic and atraumatic.  Neck:     Musculoskeletal: Normal range of motion.  Cardiovascular:     Rate and Rhythm: Normal rate and regular rhythm.     Heart sounds: Normal heart sounds. No murmur. No friction rub. No gallop.   Pulmonary:     Effort: Pulmonary effort is normal. No tachypnea or respiratory distress.     Breath sounds: Normal breath sounds. No decreased breath sounds, wheezing, rhonchi or rales.  Chest:     Chest wall: No tenderness.  Abdominal:     General: Bowel sounds are normal.     Palpations: Abdomen is soft.  Musculoskeletal: Normal range of motion.  Skin:    General: Skin is warm and dry.  Neurological:     Mental Status: She is alert and oriented to person, place, and time.     Coordination: Coordination normal.  Psychiatric:        Behavior: Behavior normal. Behavior is cooperative.        Thought Content: Thought content normal.        Judgment: Judgment normal.          Patient has been counseled extensively about nutrition and exercise as well as the importance of adherence with medications and regular follow-up. The patient was given clear instructions to go  to ER or return to medical center if symptoms don't improve, worsen or new problems develop. The patient verbalized understanding.   Follow-up: Return in about 5 weeks (around 06/30/2019) for weight loss .   [name], FNP-BC [facility] [place], [place]   05/28/2019, 10:57 AM

## 2019-05-24 LAB — LIPID PANEL
Chol/HDL Ratio: 3.3 ratio (ref 0.0–4.4)
Cholesterol, Total: 146 mg/dL (ref 100–199)
HDL: 44 mg/dL (ref >39)
LDL Calculated: 68 mg/dL (ref 0–99)
Triglycerides: 168 mg/dL — ABNORMAL HIGH (ref 0–149)
VLDL Cholesterol Cal: 34 mg/dL (ref 5–40)

## 2019-05-24 MED FILL — risperiDONE 4 MG TABS: 4 | 30 days supply | Qty: 30 | Fill #0

## 2019-05-28 ENCOUNTER — Encounter: Payer: Self-pay | Admitting: Nurse Practitioner

## 2019-05-28 DIAGNOSIS — I208 Other forms of angina pectoris: Secondary | ICD-10-CM | POA: Insufficient documentation

## 2019-05-28 DIAGNOSIS — J849 Interstitial pulmonary disease, unspecified: Secondary | ICD-10-CM | POA: Insufficient documentation

## 2019-07-03 MED FILL — metFORMIN HCL 1000 MG TABS: 1000 | 30 days supply | Qty: 60 | Fill #2

## 2019-07-03 MED FILL — XARELTO 20 MG TABLET: 20 | 30 days supply | Qty: 30 | Fill #0

## 2019-07-03 MED FILL — FUROSEMIDE 40 MG TAB: 40 | 30 days supply | Qty: 120 | Fill #4

## 2019-07-03 MED FILL — CARVEDILOL 25 MG TABLET: 25 | 90 days supply | Qty: 180 | Fill #0

## 2019-07-03 MED FILL — risperiDONE 4 MG TABS: 4 | 30 days supply | Qty: 30 | Fill #1

## 2019-07-04 ENCOUNTER — Other Ambulatory Visit: Payer: Self-pay | Admitting: Nurse Practitioner

## 2019-07-06 MED FILL — PROAIR HFA 90 MCG INHALER: 108 (90 BAS | 25 days supply | Qty: 9 | Fill #0

## 2019-07-13 ENCOUNTER — Other Ambulatory Visit: Payer: Self-pay

## 2019-07-13 ENCOUNTER — Ambulatory Visit: Payer: Medicaid Other | Attending: Internal Medicine | Admitting: Internal Medicine

## 2019-07-13 ENCOUNTER — Encounter: Payer: Self-pay | Admitting: Internal Medicine

## 2019-07-13 VITALS — BP 155/85 | HR 90 | Temp 98.5°F | Resp 16 | Wt 324.0 lb

## 2019-07-13 DIAGNOSIS — I4891 Unspecified atrial fibrillation: Secondary | ICD-10-CM | POA: Insufficient documentation

## 2019-07-13 DIAGNOSIS — E785 Hyperlipidemia, unspecified: Secondary | ICD-10-CM | POA: Insufficient documentation

## 2019-07-13 DIAGNOSIS — Z7984 Long term (current) use of oral hypoglycemic drugs: Secondary | ICD-10-CM | POA: Insufficient documentation

## 2019-07-13 DIAGNOSIS — I252 Old myocardial infarction: Secondary | ICD-10-CM | POA: Insufficient documentation

## 2019-07-13 DIAGNOSIS — G4733 Obstructive sleep apnea (adult) (pediatric): Secondary | ICD-10-CM | POA: Insufficient documentation

## 2019-07-13 DIAGNOSIS — Z6841 Body Mass Index (BMI) 40.0 and over, adult: Secondary | ICD-10-CM | POA: Insufficient documentation

## 2019-07-13 DIAGNOSIS — E119 Type 2 diabetes mellitus without complications: Secondary | ICD-10-CM | POA: Diagnosis not present

## 2019-07-13 DIAGNOSIS — J849 Interstitial pulmonary disease, unspecified: Secondary | ICD-10-CM | POA: Insufficient documentation

## 2019-07-13 DIAGNOSIS — F329 Major depressive disorder, single episode, unspecified: Secondary | ICD-10-CM

## 2019-07-13 DIAGNOSIS — Z7901 Long term (current) use of anticoagulants: Secondary | ICD-10-CM | POA: Insufficient documentation

## 2019-07-13 DIAGNOSIS — I5032 Chronic diastolic (congestive) heart failure: Secondary | ICD-10-CM | POA: Diagnosis not present

## 2019-07-13 DIAGNOSIS — Z8249 Family history of ischemic heart disease and other diseases of the circulatory system: Secondary | ICD-10-CM | POA: Diagnosis not present

## 2019-07-13 DIAGNOSIS — Z888 Allergy status to other drugs, medicaments and biological substances status: Secondary | ICD-10-CM | POA: Insufficient documentation

## 2019-07-13 DIAGNOSIS — Z79899 Other long term (current) drug therapy: Secondary | ICD-10-CM | POA: Diagnosis not present

## 2019-07-13 DIAGNOSIS — F3162 Bipolar disorder, current episode mixed, moderate: Secondary | ICD-10-CM | POA: Diagnosis not present

## 2019-07-13 DIAGNOSIS — I110 Hypertensive heart disease with heart failure: Secondary | ICD-10-CM | POA: Diagnosis not present

## 2019-07-13 DIAGNOSIS — F32A Depression, unspecified: Secondary | ICD-10-CM

## 2019-07-13 MED ORDER — ESCITALOPRAM OXALATE 10 MG PO TABS: ORAL_TABLET | ORAL | 1 refills | Status: DC

## 2019-07-13 MED FILL — ESCITALOPRAM 10 MG TABLET: 10 | 37 days supply | Qty: 30 | Fill #0

## 2019-07-13 NOTE — Patient Instructions (Signed)
I have submitted a referral for you to see the psychiatrist. Start the medication Lexapro as discussed.

## 2019-07-13 NOTE — Progress Notes (Signed)
Patient ID: [name], female    DOB: April 28, 1966  MRN: [id number]  CC: Referral   Subjective: [name] is a 53 y.o. female who presents for UC visit.  Mother is with her.  PCP is NP Raul Del. Her concerns today include:  Patient with history of HL, DM, HTN  Patient presenting today requesting a referral to to behavioral health. Requesting referral to psychiatry for depression Depressed for yrs. she was seeing a psychiatrist about 4 to 5 years ago when she lived in [place].  She attributes depression to not working for several years.  She currently lives with her mom.  She denies any suicidal thoughts.  She endorses overeating and sometimes oversleeping.  Reports being on Risperdal in the past.  Denies any auditory or visual hallucinations.  Patient Active Problem List   Diagnosis Date Noted  . ILD (interstitial lung disease) ([place]) 05/28/2019  . Exertional angina ([place]) 05/28/2019  . AKI (acute kidney injury) ([place])   . Acute CHF (congestive heart failure) ([place]) 08/03/2018  . A-fib ([place]) 08/03/2018  . Vitamin B12 deficiency 07/20/2018  . Hematochezia 07/14/2018  . Acute posthemorrhagic anemia 07/14/2018  . GIB (gastrointestinal bleeding) 07/14/2018  . Chronic anticoagulation 07/14/2018  . Persistent atrial fibrillation   . Uncontrolled type 2 diabetes mellitus with hyperglycemia ([place])   . Hypokalemia   . Cor pulmonale (chronic) ([place])   . Mycobacterium avium complex ([place]) 12/13/2015  . Pyrexia   . Dyspnea 11/13/2015  . Mediastinal adenopathy 11/13/2015  . Acute on chronic diastolic heart failure ([place]) 11/13/2015  . Abnormal CT scan, chest 11/11/2015  . Chest pain 11/11/2015  . Essential hypertension 03/07/2015  . Depression (emotion) 03/07/2015  . Noninfectious gastroenteritis and colitis 01/02/2015  . Sinusitis, chronic 01/02/2015  . Midline low back pain without sciatica 09/10/2014  . Bipolar 1 disorder, mixed, moderate ([place]) 07/02/2014  . Stress incontinence  07/02/2014  . Mania ([place]) 12/10/2013  . Speech abnormality 12/08/2013  . SVT (supraventricular tachycardia) ([place]) 12/06/2013  . Benign essential HTN 11/28/2013  . HTN (hypertension) 11/28/2013  . Pulmonary HTN, moderate to severe 11/03/2013  . Acute on chronic diastolic congestive heart failure ([place]) 11/02/2013  . Hypertensive heart disease 10/18/2013  . Chronic diastolic heart failure ([place]) 07/23/2011  . OSA (obstructive sleep apnea)- non compliant with C-pap 03/06/2011  . Morbid obesity ([place]) 02/19/2011  . Non-ST elevation (NSTEMI) myocardial infarction ([place]) 04/29/2009  . Bipolar disorder      Current Outpatient Medications on File Prior to Visit  Medication Sig Dispense Refill  . Accu-Chek Softclix Lancets lancets USE AS DIRECTED TO TEST BLOOD SUGAR ONCE DAILY 100 each 12  . albuterol (PROAIR HFA) 108 (90 Base) MCG/ACT inhaler INHALE 1 TO 2 PUFFS EVERY 6 HOURS AS NEEDED FOR WHEEZING/ SHORTNESS OF BREATH 8.5 g 2  . amLODipine (NORVASC) 10 MG tablet Take 1 tablet (10 mg total) by mouth daily. 90 tablet 3  . atorvastatin (LIPITOR) 40 MG tablet Take 1 tablet (40 mg total) by mouth daily at 6 PM. 90 tablet 0  . Blood Glucose Monitoring Suppl (ACCU-CHEK AVIVA PLUS) w/Device KIT 1 each by Does not apply route daily. USE AS DIRECTED TO TEST BLOOD SUGAR ONCE DAILY 1 kit 0  . Blood Glucose Monitoring Suppl (TRUE METRIX METER) w/Device KIT Use as instructed. Check blood glucose levels by fingerstick twice per day 1 kit 0  . carvedilol (COREG) 25 MG tablet Take 1 tablet (25 mg total) by mouth 2 (two) times daily. 180 tablet 3  .  ferrous sulfate (FEROSUL) 325 (65 FE) MG tablet Take 1 tablet (325 mg total) by mouth 2 (two) times daily. (Patient not taking: Reported on 05/23/2019) 60 tablet 3  . furosemide (LASIX) 40 MG tablet Take 2 tablets (80 mg total) by mouth 2 (two) times daily. 120 tablet 6  . glipiZIDE (GLIPIZIDE XL) 10 MG 24 hr tablet Take 1 tablet (10 mg total) by mouth daily with breakfast.  90 tablet 2  . glucose blood (ACCU-CHEK AVIVA PLUS) test strip USE AS DIRECTED TO TEST BLOOD SUGAR ONCE DAILY 100 each 12  . losartan (COZAAR) 100 MG tablet Take 1 tablet (100 mg total) by mouth daily. 90 tablet 3  . metFORMIN (GLUCOPHAGE) 1000 MG tablet Take 1 tablet (1,000 mg total) by mouth 2 (two) times daily with a meal. 60 tablet 2  . Misc. Devices MISC Please provide patient with: BiPAP therapy on 20/16 cm H2O Medium size Resmed Full Face Mask AirFit 20 for mask and heated humidification (Patient not taking: Reported on 05/23/2019) 1 each 0  . oxybutynin (DITROPAN XL) 15 MG 24 hr tablet Take 1 tablet (15 mg total) by mouth at bedtime. 90 tablet 3  . Potassium Chloride ER 20 MEQ TBCR Take 20 mEq by mouth 2 (two) times daily. 60 tablet 11  . risperidone (RISPERDAL) 4 MG tablet Take 1 tablet (4 mg total) by mouth daily. 30 tablet 1  . rivaroxaban (XARELTO) 20 MG TABS tablet TAKE 1 TABLET (20 MG TOTAL) BY MOUTH DAILY WITH SUPPER. 30 tablet 1   No current facility-administered medications on file prior to visit.     Allergies  Allergen Reactions  . Acetaminophen Other (See Comments)    Seizure-like "fits" as a child  . Caffeine     Tense, anxiety, increased urination  . [place] [Dapagliflozin] Other (See Comments)    Hallucinations, drop in blood sugar  . Lisinopril Rash    Rash with lisinopril; but fosinopril is ok per patient    Social History   Socioeconomic History  . Marital status: Single    Spouse name: Not on file  . Number of children: 0  . Years of education: 34  . Highest education level: Not on file  Occupational History  . Occupation: unemployed  Social Needs  . Financial resource strain: Not hard at all  . Food insecurity    Worry: Never true    Inability: Never true  . Transportation needs    Medical: No    Non-medical: No  Tobacco Use  . Smoking status: Never Smoker  . Smokeless tobacco: Never Used  Substance and Sexual Activity  . Alcohol use: No  .  Drug use: No  . Sexual activity: Not Currently    Birth control/protection: None  Lifestyle  . Physical activity    Days per week: Not on file    Minutes per session: Not on file  . Stress: Only a little  Relationships  . Social Herbalist on phone: Not on file    Gets together: Not on file    Attends religious service: Not on file    Active member of club or organization: Not on file    Attends meetings of clubs or organizations: Not on file    Relationship status: Not on file  . Intimate partner violence    Fear of current or ex partner: Not on file    Emotionally abused: Not on file    Physically abused: Not on file  Forced sexual activity: Not on file  Other Topics Concern  . 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 But ran out of money and is no longer attending school. (Note patient has bipolar disorder).      Born in [place] but lived in [place] before coming back to [place].       Primary language is Arabic. Lives with mother and brother.    Family History  Problem Relation Age of Onset  . Heart failure Father   . Stroke Father   . Hypertension Mother   . Heart disease Maternal Grandfather     Past Surgical History:  Procedure Laterality Date  . CARDIOVERSION N/A 04/05/2018   Procedure: CARDIOVERSION;  Surgeon: Lelon Perla, MD;  Location[facility] ENDOSCOPY;  Service: Cardiovascular;  Laterality: N/A;  . COLONOSCOPY WITH PROPOFOL Left 07/16/2018   Procedure: COLONOSCOPY WITH PROPOFOL;  Surgeon: Ronnette Juniper, MD;  Location: [facility] ENDOSCOPY;  Service: Gastroenterology;  Laterality: Left;  . LEFT HEART CATH AND CORONARY ANGIOGRAPHY N/A 08/04/2018   Procedure: LEFT HEART CATH AND CORONARY ANGIOGRAPHY;  Surgeon: Belva Crome, MD;  Location: [facility] CV LAB;  Service: Cardiovascular;  Laterality: N/A;  . None    . POLYPECTOMY  07/16/2018   Procedure:  POLYPECTOMY;  Surgeon: Ronnette Juniper, MD;  Location: [facility] ENDOSCOPY;  Service: Gastroenterology;;    ROS: Review of Systems Negative except as stated above  PHYSICAL EXAM: BP (!) 155/85   Pulse 90   Temp 98.5 F (36.9 C) (Oral)   Resp 16   Wt (!) 324 lb (147 kg)   SpO2 90%   BMI 50.75 kg/m   Physical Exam  General appearance - alert, well appearing, and in no distress Mental status - normal mood, behavior, speech, dress, motor activity, and thought processes  Depression screen [facility] 2/9 07/13/2019 05/23/2019 09/16/2018  Decreased Interest 1 0 0  Down, Depressed, Hopeless 1 0 0  PHQ - 2 Score 2 0 0  Altered sleeping 1 0 0  Tired, decreased energy - 0 0  Change in appetite 1 0 1  Feeling bad or failure about yourself  1 0 0  Trouble concentrating - 0 0  Moving slowly or fidgety/restless 1 0 0  Suicidal thoughts - 0 0  PHQ-9 Score 6 0 1  Some recent data might be hidden     CMP Latest Ref Rng & Units 04/09/2019 12/05/2018 10/11/2018  Glucose 70 - 99 mg/dL 231(H) 205(H) 166(H)  BUN 6 - 20 mg/dL _0 Creatinine 0.44 - 1.00 mg/dL 0.82 0.80 0.82  Sodium 135 - 145 mmol/L 135 139 140  Potassium 3.5 - 5.1 mmol/L 3.7 4.0 4.0  Chloride 98 - 111 mmol/L 102 99 102  CO2 22 - 32 mmol/L _1 Calcium 8.9 - 10.3 mg/dL 9.5 8.8 9.2  Total Protein 6.5 - 8.1 g/dL - - -  Total Bilirubin 0.3 - 1.2 mg/dL - - -  Alkaline Phos 38 - 126 U/L - - -  AST 15 - 41 U/L - - -  ALT 0 - 44 U/L - - -   Lipid Panel     Component Value Date/Time   CHOL 146 05/23/2019 1645   TRIG 168 (H) 05/23/2019 1645   HDL 44 05/23/2019 1645   CHOLHDL 3.3 05/23/2019 1645   CHOLHDL 3.8 08/06/2016 1245  VLDL 42 (H) 08/06/2016 1245   LDLCALC 68 05/23/2019 1645    CBC    Component Value Date/Time   WBC 6.0 04/09/2019 2118   RBC 4.27 04/09/2019 2118   HGB 11.3 (L) 04/09/2019 2118   HGB 12.3 12/05/2018 1609   HCT 36.8 04/09/2019 2118   HCT 39.3 12/05/2018 1609   PLT 196 04/09/2019 2118   PLT 280  12/05/2018 1609   MCV 86.2 04/09/2019 2118   MCV 85 12/05/2018 1609   MCH 26.5 04/09/2019 2118   MCHC 30.7 04/09/2019 2118   RDW 15.2 04/09/2019 2118   RDW 15.2 12/05/2018 1609   LYMPHSABS 0.6 (L) 08/03/2018 0247   LYMPHSABS 1.1 07/20/2018 1140   MONOABS 0.3 08/03/2018 0247   EOSABS 0.0 08/03/2018 0247   EOSABS 0.0 07/20/2018 1140   BASOSABS 0.1 08/03/2018 0247   BASOSABS 0.0 07/20/2018 1140    ASSESSMENT AND PLAN:  1. Depression, unspecified depression type -Patient feels she would benefit from being on medication.  We discussed trying her with Lexapro.  Went over possible side effects.  Advised patient if she has any increased depression or suicidal ideation while on the medication she should stop the medicine and come in.  Referral submitted to psychiatry Message sent to our LCSW to follow-up with patient in about 4 weeks. - Ambulatory referral to Psychiatry - escitalopram (LEXAPRO) 10 MG tablet; 1/2 ab PO daily x 2 wks then 1 tab PO daily  Dispense: 30 tablet; Refill: 1   Patient was given the opportunity to ask questions.  Patient verbalized understanding of the plan and was able to repeat key elements of the plan.   Orders Placed This Encounter  Procedures  . Ambulatory referral to Psychiatry     Requested Prescriptions   Signed Prescriptions Disp Refills  . escitalopram (LEXAPRO) 10 MG tablet 30 tablet 1    Sig: 1/2 ab PO daily x 2 wks then 1 tab PO daily    No follow-ups on file.  Karle Plumber, MD, FACP

## 2019-07-18 ENCOUNTER — Ambulatory Visit (HOSPITAL_BASED_OUTPATIENT_CLINIC_OR_DEPARTMENT_OTHER): Payer: Medicaid Other | Admitting: Pharmacist

## 2019-07-18 ENCOUNTER — Encounter: Payer: Self-pay | Admitting: Nurse Practitioner

## 2019-07-18 ENCOUNTER — Ambulatory Visit: Payer: Medicaid Other | Attending: Nurse Practitioner | Admitting: Nurse Practitioner

## 2019-07-18 ENCOUNTER — Other Ambulatory Visit: Payer: Self-pay

## 2019-07-18 VITALS — BP 140/103 | HR 103 | Temp 98.3°F | Ht 67.0 in | Wt 323.0 lb

## 2019-07-18 DIAGNOSIS — G4733 Obstructive sleep apnea (adult) (pediatric): Secondary | ICD-10-CM | POA: Diagnosis not present

## 2019-07-18 DIAGNOSIS — F3162 Bipolar disorder, current episode mixed, moderate: Secondary | ICD-10-CM | POA: Diagnosis not present

## 2019-07-18 DIAGNOSIS — I4819 Other persistent atrial fibrillation: Secondary | ICD-10-CM

## 2019-07-18 DIAGNOSIS — Z23 Encounter for immunization: Secondary | ICD-10-CM

## 2019-07-18 DIAGNOSIS — E1165 Type 2 diabetes mellitus with hyperglycemia: Secondary | ICD-10-CM | POA: Diagnosis not present

## 2019-07-18 DIAGNOSIS — I10 Essential (primary) hypertension: Secondary | ICD-10-CM | POA: Diagnosis not present

## 2019-07-18 DIAGNOSIS — I5032 Chronic diastolic (congestive) heart failure: Secondary | ICD-10-CM

## 2019-07-18 DIAGNOSIS — Z6841 Body Mass Index (BMI) 40.0 and over, adult: Secondary | ICD-10-CM | POA: Diagnosis not present

## 2019-07-18 LAB — GLUCOSE, POCT (MANUAL RESULT ENTRY): POC Glucose: 189 mg/dl — AB (ref 70–99)

## 2019-07-18 MED ORDER — FUROSEMIDE 80 MG PO TABS: 80.0000 mg | ORAL_TABLET | Freq: Two times a day (BID) | ORAL | 1 refills | Status: DC

## 2019-07-18 MED ORDER — RISPERIDONE 4 MG PO TABS: 4.0000 mg | ORAL_TABLET | Freq: Every day | ORAL | 1 refills | Status: DC

## 2019-07-18 MED ORDER — METFORMIN HCL 1000 MG PO TABS: 1000.0000 mg | ORAL_TABLET | Freq: Two times a day (BID) | ORAL | 2 refills | Status: DC

## 2019-07-18 MED ORDER — RIVAROXABAN 20 MG PO TABS: ORAL_TABLET | ORAL | 1 refills | Status: DC

## 2019-07-18 MED FILL — FUROSEMIDE 80 MG TAB: 80 | 9 days supply | Qty: 180 | Fill #0

## 2019-07-18 NOTE — Progress Notes (Signed)
Patient presents for vaccination against tetanus per orders of [name]. Consent given. Counseling provided. No contraindications exists. Vaccine administered without incident.   

## 2019-07-18 NOTE — Progress Notes (Signed)
Assessment & Plan:  [name] was seen today for weight loss.  Diagnoses and all orders for this visit:  Uncontrolled type 2 diabetes mellitus with hyperglycemia (HCC) -     Glucose (CBG) -     metFORMIN (GLUCOPHAGE) 1000 MG tablet; Take 1 tablet (1,000 mg total) by mouth 2 (two) times daily with a meal. -     [id number]  Chronic diastolic heart failure (HCC) -     furosemide (LASIX) 80 MG tablet; Take 1 tablet (80 mg total) by mouth 2 (two) times daily. -     Brain natriuretic peptide -     ECHOCARDIOGRAM COMPLETE; Future  Bipolar 1 disorder, mixed, moderate (HCC) -     risperidone (RISPERDAL) 4 MG tablet; Take 1 tablet (4 mg total) by mouth daily.  Persistent atrial fibrillation (HCC) -     rivaroxaban (XARELTO) 20 MG TABS tablet; TAKE 1 TABLET (20 MG TOTAL) BY MOUTH DAILY WITH SUPPER.  Morbid obesity with body mass index (BMI) of 50.0 to 59.9 in adult [facility]) -     Amb Referral to Bariatric Surgery     Patient has been counseled on age-appropriate routine health concerns for screening and prevention. These are reviewed and up-to-date. Referrals have been placed accordingly. Immunizations are up-to-date or declined.    Subjective:   Chief Complaint  Patient presents with  . Weight Loss    Pt. is here for weight loss follow up.    HPI [name] 12 53 y.o. female presents to office today for follow up to weight loss. She is currently taking taking phentermine and has lost about 24 pounds since August.  She is also exercising and trying to make dietary changes.    DM TYPE 2 Not well controlled and increased from 7.1-8.1 currently.  She is only monitoring her blood glucose levels at home once a day with average readings postprandial:180-220s (mostly related to poor dietary adherence). She is overdue for eye exam.  LDL at goal of less than 70.  She endorses medication compliance taking atorvastatin 40 mg daily, glipizide 10 mg daily, Metformin 1000 mg twice daily. Lab Results   Component Value Date   HGBA1C 8.1 (A) 05/23/2019   Lab Results  Component Value Date   LDLCALC 68 05/23/2019    Essential Hypertension Not well controlled. She endorses medication amlodipine 28m, carvedilol 25 mg BID, and losartan 100 mg daily. Denies chest pain, shortness of breath, palpitations, lightheadedness, dizziness, headaches or BLE edema. She does not monitor her blood pressure at home. Denies chest pain, palpitations, lightheadedness, dizziness, headaches or BLE edema.  BP Readings from Last 3 Encounters:  07/18/19 (!) 140/103  07/13/19 (!) 155/85  05/23/19 134/87    CHF She endorses significant shortness of breath. Taking her lasix 80 mg BID. Denies any BLE edema. Dyspnea is present with minimal exertion.    Review of Systems  Constitutional: Negative for fever, malaise/fatigue and weight loss.  HENT: Negative.  Negative for nosebleeds.   Eyes: Negative.  Negative for blurred vision, double vision and photophobia.  Respiratory: Positive for shortness of breath. Negative for cough, hemoptysis, sputum production and wheezing.   Cardiovascular: Negative.  Negative for chest pain, palpitations and leg swelling.  Gastrointestinal: Negative.  Negative for heartburn, nausea and vomiting.  Musculoskeletal: Negative.  Negative for myalgias.  Neurological: Negative.  Negative for dizziness, focal weakness, seizures and headaches.  Psychiatric/Behavioral: Negative.  Negative for suicidal ideas.    Past Medical History:  Diagnosis Date  . Acute on  chronic diastolic congestive heart failure ([place]) 11/02/2013   10/03/2015, 11/13/2015, 08/03/2017  . Benign essential HTN 11/28/2013  . Bipolar disease, chronic ([place])   . Chest pain    a. 2012 Myoview: EF 63%, no isch/infarct;  b. 04/2016 Lexiscan MV: EF 73%, no ischemia/infarct-->Low risk.  . Chronic diastolic CHF (congestive heart failure) ([place]) 07/23/2011   a. 2015 Echo: EF 55-60%, Gr2 DD;  b. 09/2015 Echo: EF 60-65%, no rwma, mod  dil LA, PASP 89mHg.  . Cor pulmonale (chronic) ([place]   . History of thyrotoxicosis   . HTN (hypertension) 11/28/2013  . Hypertensive heart disease 10/18/2013  . Hypoglycemia   . Insulin dependent type 2 diabetes mellitus, uncontrolled ([place]   . Mediastinal adenopathy   . Morbid obesity due to excess calories ([place] 02/19/2011  . Morbid obesity with BMI of 50.0-59.9, adult ([place]   . OSA (obstructive sleep apnea) 03/06/2011  . Persistent atrial fibrillation ([place] 12/09/2017  . Pulmonary HTN, moderate to severe 11/03/2013  . Sinusitis, chronic 01/02/2015  . SVT (supraventricular tachycardia) ([place] 12/06/2013  . Uncontrolled type 2 diabetes mellitus with hyperglycemia ([facility]     Past Surgical History:  Procedure Laterality Date  . CARDIOVERSION N/A 04/05/2018   Procedure: CARDIOVERSION;  Surgeon: CLelon Perla MD;  [facility];  Service: Cardiovascular;  Laterality: N/A;  . COLONOSCOPY WITH PROPOFOL Left 07/16/2018   Procedure: COLONOSCOPY WITH PROPOFOL;  Surgeon: KRonnette Juniper MD;  Location: [facility] ENDOSCOPY;  Service: Gastroenterology;  Laterality: Left;  . LEFT HEART CATH AND CORONARY ANGIOGRAPHY N/A 08/04/2018   Procedure: LEFT HEART CATH AND CORONARY ANGIOGRAPHY;  Surgeon: SBelva Crome MD;  [facility];  Service: Cardiovascular;  Laterality: N/A;  . None    . POLYPECTOMY  07/16/2018   Procedure: POLYPECTOMY;  Surgeon: KRonnette Juniper MD;  [facility];  Service: Gastroenterology;;    Family History  Problem Relation Age of Onset  . Heart failure Father   . Stroke Father   . Hypertension Mother   . Heart disease Maternal Grandfather     Social History Reviewed with no changes to be made today.   Outpatient Medications Prior to Visit  Medication Sig Dispense Refill  . Accu-Chek Softclix Lancets lancets USE AS DIRECTED TO TEST BLOOD SUGAR ONCE DAILY 100 each 12  . albuterol (PROAIR HFA) 108 (90 Base) MCG/ACT inhaler INHALE 1 TO 2 PUFFS EVERY 6 HOURS AS  NEEDED FOR WHEEZING/ SHORTNESS OF BREATH 8.5 g 2  . amLODipine (NORVASC) 10 MG tablet Take 1 tablet (10 mg total) by mouth daily. 90 tablet 3  . Blood Glucose Monitoring Suppl (ACCU-CHEK AVIVA PLUS) w/Device KIT 1 each by Does not apply route daily. USE AS DIRECTED TO TEST BLOOD SUGAR ONCE DAILY 1 kit 0  . Blood Glucose Monitoring Suppl (TRUE METRIX METER) w/Device KIT Use as instructed. Check blood glucose levels by fingerstick twice per day 1 kit 0  . escitalopram (LEXAPRO) 10 MG tablet 1/2 ab PO daily x 2 wks then 1 tab PO daily 30 tablet 1  . glipiZIDE (GLIPIZIDE XL) 10 MG 24 hr tablet Take 1 tablet (10 mg total) by mouth daily with breakfast. 90 tablet 2  . glucose blood (ACCU-CHEK AVIVA PLUS) test strip USE AS DIRECTED TO TEST BLOOD SUGAR ONCE DAILY 100 each 12  . losartan (COZAAR) 100 MG tablet Take 1 tablet (100 mg total) by mouth daily. 90 tablet 3  . oxybutynin (DITROPAN XL) 15 MG 24 hr tablet Take 1 tablet (15  mg total) by mouth at bedtime. 90 tablet 3  . Potassium Chloride ER 20 MEQ TBCR Take 20 mEq by mouth 2 (two) times daily. 60 tablet 11  . furosemide (LASIX) 40 MG tablet Take 2 tablets (80 mg total) by mouth 2 (two) times daily. 120 tablet 6  . metFORMIN (GLUCOPHAGE) 1000 MG tablet Take 1 tablet (1,000 mg total) by mouth 2 (two) times daily with a meal. 60 tablet 2  . risperidone (RISPERDAL) 4 MG tablet Take 1 tablet (4 mg total) by mouth daily. 30 tablet 1  . rivaroxaban (XARELTO) 20 MG TABS tablet TAKE 1 TABLET (20 MG TOTAL) BY MOUTH DAILY WITH SUPPER. 30 tablet 1  . atorvastatin (LIPITOR) 40 MG tablet Take 1 tablet (40 mg total) by mouth daily at 6 PM. (Patient not taking: Reported on 07/18/2019) 90 tablet 0  . carvedilol (COREG) 25 MG tablet Take 1 tablet (25 mg total) by mouth 2 (two) times daily. 180 tablet 3  . ferrous sulfate (FEROSUL) 325 (65 FE) MG tablet Take 1 tablet (325 mg total) by mouth 2 (two) times daily. (Patient not taking: Reported on 05/23/2019) 60 tablet 3  .  Misc. Devices MISC Please provide patient with: BiPAP therapy on 20/16 cm H2O Medium size Resmed Full Face Mask AirFit 20 for mask and heated humidification (Patient not taking: Reported on 05/23/2019) 1 each 0   No facility-administered medications prior to visit.     Allergies  Allergen Reactions  . Acetaminophen Other (See Comments)    Seizure-like "fits" as a child  . Caffeine     Tense, anxiety, increased urination  . [place] [Dapagliflozin] Other (See Comments)    Hallucinations, drop in blood sugar  . Lisinopril Rash    Rash with lisinopril; but fosinopril is ok per patient       Objective:    BP (!) 140/103 (BP Location: Right Arm, Patient Position: Sitting, Cuff Size: Large)   Pulse (!) 103   Temp 98.3 F (36.8 C) (Oral)   Ht '5\' 7"'$  (1.702 m)   Wt (!) 323 lb (146.5 kg)   SpO2 96%   BMI 50.59 kg/m  Wt Readings from Last 3 Encounters:  07/18/19 (!) 323 lb (146.5 kg)  07/13/19 (!) 324 lb (147 kg)  05/23/19 (!) 347 lb (157.4 kg)    Physical Exam Vitals signs and nursing note reviewed.  Constitutional:      Appearance: She is well-developed.  HENT:     Head: Normocephalic and atraumatic.  Neck:     Musculoskeletal: Normal range of motion.  Cardiovascular:     Rate and Rhythm: Regular rhythm. Tachycardia present.     Heart sounds: Normal heart sounds. No murmur. No friction rub. No gallop.   Pulmonary:     Effort: Tachypnea present. No accessory muscle usage or respiratory distress.     Breath sounds: Normal breath sounds. No decreased breath sounds, wheezing, rhonchi or rales.     Comments: Difficulty completing sentences without noticeable shortness of breath.  Chest:     Chest wall: No tenderness.  Abdominal:     General: Bowel sounds are normal.     Palpations: Abdomen is soft.  Musculoskeletal: Normal range of motion.  Skin:    General: Skin is warm and dry.  Neurological:     Mental Status: She is alert and oriented to person, place, and time.      Coordination: Coordination normal.  Psychiatric:        Behavior: Behavior normal. Behavior  is cooperative.        Thought Content: Thought content normal.        Judgment: Judgment normal.          Patient has been counseled extensively about nutrition and exercise as well as the importance of adherence with medications and regular follow-up. The patient was given clear instructions to go to ER or return to medical center if symptoms don't improve, worsen or new problems develop. The patient verbalized understanding.   Follow-up: Return in about 4 weeks (around 08/15/2019) for Fasting labs in 4 weeks and see me in late december.   [name], FNP-BC [facility] [place]   07/19/2019, 12:16 PM

## 2019-07-19 ENCOUNTER — Encounter: Payer: Self-pay | Admitting: Nurse Practitioner

## 2019-07-19 LAB — CMP14+EGFR
ALT: 16 IU/L (ref 0–32)
AST: 25 IU/L (ref 0–40)
Albumin/Globulin Ratio: 1.2 (ref 1.2–2.2)
Albumin: 3.9 g/dL (ref 3.8–4.9)
Alkaline Phosphatase: 128 IU/L — ABNORMAL HIGH (ref 39–117)
BUN/Creatinine Ratio: 18 (ref 9–23)
BUN: 16 mg/dL (ref 6–24)
Bilirubin Total: 0.5 mg/dL (ref 0.0–1.2)
CO2: 25 mmol/L (ref 20–29)
Calcium: 9.7 mg/dL (ref 8.7–10.2)
Chloride: 99 mmol/L (ref 96–106)
Creatinine, Ser: 0.89 mg/dL (ref 0.57–1.00)
GFR calc Af Amer: 87 mL/min/{1.73_m2} (ref >59)
GFR calc non Af Amer: 75 mL/min/{1.73_m2} (ref >59)
Globulin, Total: 3.3 g/dL (ref 1.5–4.5)
Glucose: 184 mg/dL — ABNORMAL HIGH (ref 65–99)
Potassium: 3.8 mmol/L (ref 3.5–5.2)
Sodium: 137 mmol/L (ref 134–144)
Total Protein: 7.2 g/dL (ref 6.0–8.5)

## 2019-07-19 LAB — BRAIN NATRIURETIC PEPTIDE: BNP: 229.7 pg/mL — ABNORMAL HIGH (ref 0.0–100.0)

## 2019-07-24 ENCOUNTER — Ambulatory Visit (HOSPITAL_COMMUNITY)
Admission: RE | Admit: 2019-07-24 | Discharge: 2019-07-24 | Disposition: A | Discharge status: Home / Self Care | Payer: Medicaid Other | Source: Ambulatory Visit | Attending: Nurse Practitioner | Admitting: Nurse Practitioner

## 2019-07-24 ENCOUNTER — Other Ambulatory Visit: Payer: Self-pay

## 2019-07-24 DIAGNOSIS — I5032 Chronic diastolic (congestive) heart failure: Secondary | ICD-10-CM | POA: Diagnosis not present

## 2019-07-24 DIAGNOSIS — I272 Pulmonary hypertension, unspecified: Secondary | ICD-10-CM | POA: Insufficient documentation

## 2019-07-24 DIAGNOSIS — I4891 Unspecified atrial fibrillation: Secondary | ICD-10-CM | POA: Insufficient documentation

## 2019-07-24 DIAGNOSIS — I083 Combined rheumatic disorders of mitral, aortic and tricuspid valves: Secondary | ICD-10-CM | POA: Diagnosis not present

## 2019-07-24 DIAGNOSIS — I110 Hypertensive heart disease with heart failure: Secondary | ICD-10-CM | POA: Insufficient documentation

## 2019-07-24 NOTE — Progress Notes (Signed)
  Echocardiogram 2D Echocardiogram has been performed.  [name] G [name] 07/24/2019, 12:12 PM

## 2019-08-01 MED FILL — metFORMIN HCL 1000 MG TABS: 1000 | 90 days supply | Qty: 180 | Fill #0

## 2019-08-01 MED FILL — XARELTO 20 MG TABLET: 20 | 90 days supply | Qty: 90 | Fill #0

## 2019-08-01 MED FILL — risperiDONE 4 MG TABS: 4 | 90 days supply | Qty: 90 | Fill #0

## 2019-08-12 ENCOUNTER — Other Ambulatory Visit (HOSPITAL_COMMUNITY)
Admission: RE | Admit: 2019-08-12 | Discharge: 2019-08-12 | Disposition: A | Discharge status: Home / Self Care | Payer: Medicaid Other | Source: Ambulatory Visit | Attending: Nurse Practitioner | Admitting: Nurse Practitioner

## 2019-08-12 DIAGNOSIS — Z01812 Encounter for preprocedural laboratory examination: Secondary | ICD-10-CM | POA: Diagnosis not present

## 2019-08-12 DIAGNOSIS — Z20828 Contact with and (suspected) exposure to other viral communicable diseases: Secondary | ICD-10-CM | POA: Diagnosis not present

## 2019-08-13 LAB — NOVEL CORONAVIRUS, NAA (HOSP ORDER, SEND-OUT TO REF LAB; TAT 18-24 HRS): SARS-CoV-2, NAA: NOT DETECTED

## 2019-08-15 ENCOUNTER — Other Ambulatory Visit: Payer: Self-pay

## 2019-08-15 ENCOUNTER — Ambulatory Visit (HOSPITAL_BASED_OUTPATIENT_CLINIC_OR_DEPARTMENT_OTHER): Payer: Medicaid Other | Attending: Nurse Practitioner | Admitting: Internal Medicine

## 2019-08-15 VITALS — Ht 67.0 in | Wt 320.0 lb

## 2019-08-15 DIAGNOSIS — G4733 Obstructive sleep apnea (adult) (pediatric): Secondary | ICD-10-CM | POA: Insufficient documentation

## 2019-08-15 DIAGNOSIS — Z6841 Body Mass Index (BMI) 40.0 and over, adult: Secondary | ICD-10-CM | POA: Insufficient documentation

## 2019-08-20 DIAGNOSIS — Z6841 Body Mass Index (BMI) 40.0 and over, adult: Secondary | ICD-10-CM

## 2019-08-20 NOTE — Procedures (Signed)
Patient Name: [name], [place] Date: 08/15/2019 Gender: Female D.O.B: 23-Apr-1966 Age (years): 74 Referring Provider: [name] NP Height (inches): 76 Interpreting Physician: Baird Lyons MD, ABSM Weight (lbs): 320 RPSGT: Laren Everts BMI: 52 MRN: [id number] Neck Size: 17.00  CLINICAL INFORMATION Sleep Study Type: NPSG  Indication for sleep study: Congestive Heart Failure, Diabetes, Hypertension, Obesity, OSA, Snoring, Witnessed Apneas Epworth Sleepiness Score: 3  Most recent polysomnogram dated 08/13/2018 revealed an AHI of 22.5/h and RDI of 36.9/h. Most recent titration study dated 08/13/2018 revealed an AHI of 11.2/h.  SLEEP STUDY TECHNIQUE As per the AASM Manual for the Scoring of Sleep and Associated Events v2.3 (April 2016) with a hypopnea requiring 4% desaturations.  The channels recorded and monitored were frontal, central and occipital EEG, electrooculogram (EOG), submentalis EMG (chin), nasal and oral airflow, thoracic and abdominal wall motion, anterior tibialis EMG, snore microphone, electrocardiogram, and pulse oximetry.  MEDICATIONS Medications self-administered by patient taken the night of the study : N/A  SLEEP ARCHITECTURE The study was initiated at 10:56:35 PM and ended at 5:30:13 AM.  Sleep onset time was 77.0 minutes and the sleep efficiency was 75.1%%. The total sleep time was 295.5 minutes.  Stage REM latency was 130.5 minutes.  The patient spent 14.9%% of the night in stage N1 sleep, 69.2%% in stage N2 sleep, 0.0%% in stage N3 and 15.9% in REM.  Alpha intrusion was absent.  Supine sleep was 0.00%.  RESPIRATORY PARAMETERS The overall apnea/hypopnea index (AHI) was 67.4 per hour. There were 99 total apneas, including 92 obstructive, 7 central and 0 mixed apneas. There were 233 hypopneas and 9 RERAs.  The AHI during Stage REM sleep was 86.8 per hour.  AHI while supine was N/A per hour.  The mean oxygen saturation was 94.5%. The  minimum SpO2 during sleep was 83.0%.  moderate snoring was noted during this study.  CARDIAC DATA The 2 lead EKG demonstrated atrial fibrillation. The mean heart rate was 81.2 beats per minute. Other EKG findings include: None.  LEG MOVEMENT DATA The total PLMS were 0 with a resulting PLMS index of 0.0. Associated arousal with leg movement index was 0.0 .  IMPRESSIONS - Severe obstructive sleep apnea occurred during this study (AHI = 67.4/h). - Insufficient early sleep to meet protocol requiremenets for split CPAP titration. - No significant central sleep apnea occurred during this study (CAI = 1.4/h). - Mild oxygen desaturation was noted during this study (Min O2 = 83.0%). Mean sat 94.5%. - The patient snored with moderate snoring volume. - No cardiac abnormalities were noted during this study. - Clinically significant periodic limb movements did not occur during sleep. No significant associated arousals.  DIAGNOSIS - Obstructive Sleep Apnea (327.23 [G47.33 ICD-10])  RECOMMENDATIONS - Therapeutic CPAP titration sleep study to determine optimal pressure required to alleviate sleep disordered breathing. - Be careful with alcohol, sedatives and other CNS depressants that may worsen sleep apnea and disrupt normal sleep architecture. - Sleep hygiene should be reviewed to assess factors that may improve sleep quality. - Weight management and regular exercise should be initiated or continued if appropriate.  [Electronically signed] 08/20/2019 10:24 AM  Baird Lyons MD, ABSM Diplomate, American Board of Sleep Medicine   NPI: [id number]                         [place], [place] [facility]  ELECTRONICALLY SIGNED ON:  08/20/2019, 10:21 AM [place] PH: [phone]   FX: [phone] ACCREDITED BY THE AMERICAN ACADEMY OF SLEEP MEDICINE

## 2019-08-21 ENCOUNTER — Telehealth: Payer: Self-pay | Admitting: Licensed Clinical Social Worker

## 2019-08-21 NOTE — Telephone Encounter (Signed)
Call placed to patient to follow up on [facility] referral. LCSW left message requesting a return call.

## 2019-08-22 ENCOUNTER — Telehealth: Payer: Self-pay | Admitting: Nurse Practitioner

## 2019-08-22 DIAGNOSIS — E119 Type 2 diabetes mellitus without complications: Secondary | ICD-10-CM | POA: Diagnosis not present

## 2019-08-22 DIAGNOSIS — H25813 Combined forms of age-related cataract, bilateral: Secondary | ICD-10-CM | POA: Diagnosis not present

## 2019-08-22 LAB — HM DIABETES EYE EXAM

## 2019-08-22 NOTE — Telephone Encounter (Signed)
Patient came in asking for [place] to send her medical records to [facility] I called them because they is apart of [facility]. [place] said that  They could see her because she has to many issues and she needs to be sent somewhere else.

## 2019-08-22 NOTE — Telephone Encounter (Signed)
[name] is this something the new program can assist with?

## 2019-08-23 ENCOUNTER — Telehealth: Payer: Self-pay | Admitting: Licensed Clinical Social Worker

## 2019-08-23 ENCOUNTER — Other Ambulatory Visit: Payer: Self-pay | Admitting: Nurse Practitioner

## 2019-08-23 DIAGNOSIS — I272 Pulmonary hypertension, unspecified: Secondary | ICD-10-CM

## 2019-08-23 DIAGNOSIS — G4733 Obstructive sleep apnea (adult) (pediatric): Secondary | ICD-10-CM

## 2019-08-23 NOTE — Telephone Encounter (Signed)
I will contact [facility] to discuss referral to psychiatry. If needed, I can also refer pt to [place].

## 2019-08-23 NOTE — Telephone Encounter (Signed)
Thank you so much

## 2019-08-23 NOTE — Telephone Encounter (Signed)
Call placed to patient to follow up on referral to [place]. LCSW left message for a return call.

## 2019-08-28 ENCOUNTER — Telehealth: Payer: Self-pay | Admitting: Licensed Clinical Social Worker

## 2019-08-28 NOTE — Telephone Encounter (Signed)
Call placed to patient regarding IBH consult to follow up on medication management. Phone continued to ring with no option to leave a voice message.

## 2019-09-06 ENCOUNTER — Telehealth: Payer: Self-pay | Admitting: Licensed Clinical Social Worker

## 2019-09-06 NOTE — Telephone Encounter (Signed)
Return call placed to patient regarding [facility] referral. LCSW left message requesting a return call

## 2019-09-08 MED FILL — PROAIR HFA 90 MCG INHALER: 108 (90 BAS | 25 days supply | Qty: 9 | Fill #1

## 2019-09-08 MED FILL — ESCITALOPRAM 10 MG TABLET: 10 | 37 days supply | Qty: 30 | Fill #1

## 2019-09-11 MED FILL — ACCU-CHEK AVIVA PLUS STRP: 100 days supply | Qty: 100 | Fill #1

## 2019-09-11 MED FILL — LOSARTAN POTASSIUM 100 MG T: 100 | 90 days supply | Qty: 90 | Fill #0

## 2019-09-11 MED FILL — POTASSIUM CL ER 20 MEQ TAB: 20 | 90 days supply | Qty: 180 | Fill #0

## 2019-09-11 MED FILL — AMLODIPINE BESYLATE 10 MG T: 10 | 90 days supply | Qty: 90 | Fill #0

## 2019-09-30 ENCOUNTER — Other Ambulatory Visit (HOSPITAL_COMMUNITY)
Admission: RE | Admit: 2019-09-30 | Discharge: 2019-09-30 | Disposition: A | Discharge status: Home / Self Care | Payer: Medicaid Other | Source: Ambulatory Visit | Attending: Internal Medicine | Admitting: Internal Medicine

## 2019-09-30 DIAGNOSIS — Z20822 Contact with and (suspected) exposure to covid-19: Secondary | ICD-10-CM | POA: Diagnosis not present

## 2019-09-30 DIAGNOSIS — Z01812 Encounter for preprocedural laboratory examination: Secondary | ICD-10-CM | POA: Insufficient documentation

## 2019-09-30 LAB — SARS CORONAVIRUS 2 (TAT 6-24 HRS): SARS Coronavirus 2: NEGATIVE

## 2019-10-03 ENCOUNTER — Ambulatory Visit (HOSPITAL_BASED_OUTPATIENT_CLINIC_OR_DEPARTMENT_OTHER): Payer: Medicaid Other | Attending: Nurse Practitioner | Admitting: Internal Medicine

## 2019-10-03 VITALS — Ht 67.0 in | Wt 347.0 lb

## 2019-10-03 DIAGNOSIS — G4733 Obstructive sleep apnea (adult) (pediatric): Secondary | ICD-10-CM | POA: Diagnosis not present

## 2019-10-04 ENCOUNTER — Other Ambulatory Visit: Payer: Self-pay

## 2019-10-07 DIAGNOSIS — G4733 Obstructive sleep apnea (adult) (pediatric): Secondary | ICD-10-CM

## 2019-10-07 NOTE — Procedures (Deleted)
Patient Name: [name], [name] Date: 10/03/2019 Gender: Female D.O.B: 06-04-1966 Age (years): 37 Referring Provider: [name] NP Height (inches): 59 Interpreting Physician: Baird Lyons MD, ABSM Weight (lbs): 323 RPSGT: Zadie Rhine BMI: 51 MRN: [id number] Neck Size: 17.00  CLINICAL INFORMATION The patient is referred for a BiPAP titration to treat sleep apnea.  Date of NPSG, Split Night or HST:  NPSG  08/15/2019  AHI 67.4/ hr, desaturation to 83%, body weight 320 lbs  SLEEP STUDY TECHNIQUE As per the AASM Manual for the Scoring of Sleep and Associated Events v2.3 (April 2016) with a hypopnea requiring 4% desaturations.  The channels recorded and monitored were frontal, central and occipital EEG, electrooculogram (EOG), submentalis EMG (chin), nasal and oral airflow, thoracic and abdominal wall motion, anterior tibialis EMG, snore microphone, electrocardiogram, and pulse oximetry. Bilevel positive airway pressure (BPAP) was initiated at the beginning of the study and titrated to treat sleep-disordered breathing.  MEDICATIONS Medications self-administered by patient taken the night of the study : Norvasc, Coreg, Lexapro  RESPIRATORY PARAMETERS Optimal IPAP Pressure (cm): 22 AHI at Optimal Pressure (/hr) 0.0 Optimal EPAP Pressure (cm): 18   Overall Minimal O2 (%): 88.0 Minimal O2 at Optimal Pressure (%): 91.0 SLEEP ARCHITECTURE Start Time: 9:35:57 PM Stop Time: 4:48:07 AM Total Time (min): 432.2 Total Sleep Time (min): 299.2 Sleep Latency (min): 117.0 Sleep Efficiency (%): 69.2% REM Latency (min): 121.0 WASO (min): 16.0 Stage N1 (%): 2.0% Stage N2 (%): 76.5% Stage N3 (%): 0.0% Stage R (%): 21.5 Supine (%): 24.57 Arousal Index (/hr): 4.6   CARDIAC DATA The 2 lead EKG demonstrated sinus rhythm. The mean heart rate was 75.2 beats per minute. Other EKG findings include: PVCs.  LEG MOVEMENT DATA The total Periodic Limb Movements of Sleep (PLMS) were 0. The PLMS index  was 0.0. A PLMS index of <15 is considered normal in adults.  IMPRESSIONS - CPAP provided inadequate control at comfortable pressure and was changed to BILEVEL titration. - An optimal BIPAP pressure was selected for this patient ( 22 / 18 cm of water) - Central sleep apnea was not noted during this titration (CAI = 0.2/h). - Mild oxygen desaturations were observed during this titration (min O2 = 88.0%). Minimum saturation on BIPAP 22/18 was 91%. - No snoring was audible during this study. - 2-lead EKG demonstrated: PVCs - Clinically significant periodic limb movements were not noted during this study.   DIAGNOSIS - Obstructive Sleep Apnea (327.23 [G47.33 ICD-10])  RECOMMENDATIONS - Trial of BiPAP therapy on 22/18 cm H2O. Patient used a Medium size Fisher&Paykel Full Face Mask Simplus mask and heated humidification. - Be careful with alcohol, sedatives and other CNS depressants that may worsen sleep apnea and disrupt normal sleep architecture. - Sleep hygiene should be reviewed to assess factors that may improve sleep quality. - Weight management and regular exercise should be initiated or continued.  [Electronically signed] 10/07/2019 03:30 PM  Baird Lyons MD, [place], American Board of Sleep Medicine   NPI: [id number]                          [place], [place] [facility]  ELECTRONICALLY SIGNED ON:  10/07/2019, 3:24 PM [place] PH: [phone]   FX: [phone] [place] [facility]       NAME: [name] DATE OF BIRTH:  May 26, 1966 MEDICAL RECORD NUMBER [id number]  [facility]ATION[facility]  [facility]  PHYSICIAN: Grady Lucci  DATE OF STUDY: 10/03/2019  SLEEP STUDY TYPE: Out of Center Sleep Test                REFERRING PHYSICIAN: [name], NP  INDICATION FOR STUDY: ***  EPWORTH SLEEPINESS SCORE:   HEIGHT: 5\' 7"  (170.2 cm)  WEIGHT:  (!) 347 lb (157.4 kg)    Body mass index is 54.35 kg/m.  NECK SIZE: 17 in.  MEDICATIONS: ***  IMPRESSION:  ***    RECOMMENDATION:  ***   [name] Diplomate, American Board of Sleep Medicine  ELECTRONICALLY SIGNED ON:  10/07/2019, 3:22 PM [place] PH: [phone]   FX: [phone] [place]

## 2019-10-07 NOTE — Procedures (Signed)
   Patient Name: [name], [name] Date: 10/03/2019 Gender: Female D.O.B: 1966/06/15 Age (years): 42 Referring Provider: [name] NP Height (inches): 3 Interpreting Physician: Baird Lyons MD, ABSM Weight (lbs): 323 RPSGT: Zadie Rhine BMI: 51 MRN: [id number] Neck Size: 17.00  CLINICAL INFORMATION The patient is referred for a BiPAP titration to treat sleep apnea.  Date of NPSG, Split Night or HST:  NPSG  08/15/2019  AHI 67.4/ hr, desaturation to 83%, body weight 320 lbs  SLEEP STUDY TECHNIQUE As per the AASM Manual for the Scoring of Sleep and Associated Events v2.3 (April 2016) with a hypopnea requiring 4% desaturations.  The channels recorded and monitored were frontal, central and occipital EEG, electrooculogram (EOG), submentalis EMG (chin), nasal and oral airflow, thoracic and abdominal wall motion, anterior tibialis EMG, snore microphone, electrocardiogram, and pulse oximetry. Bilevel positive airway pressure (BPAP) was initiated at the beginning of the study and titrated to treat sleep-disordered breathing.  MEDICATIONS Medications self-administered by patient taken the night of the study : Norvasc, Coreg, Lexapro  RESPIRATORY PARAMETERS Optimal IPAP Pressure (cm): 22 AHI at Optimal Pressure (/hr) 0.0 Optimal EPAP Pressure (cm): 18   Overall Minimal O2 (%): 88.0 Minimal O2 at Optimal Pressure (%): 91.0 SLEEP ARCHITECTURE Start Time: 9:35:57 PM Stop Time: 4:48:07 AM Total Time (min): 432.2 Total Sleep Time (min): 299.2 Sleep Latency (min): 117.0 Sleep Efficiency (%): 69.2% REM Latency (min): 121.0 WASO (min): 16.0 Stage N1 (%): 2.0% Stage N2 (%): 76.5% Stage N3 (%): 0.0% Stage R (%): 21.5 Supine (%): 24.57 Arousal Index (/hr): 4.6   CARDIAC DATA The 2 lead EKG demonstrated sinus rhythm. The mean heart rate was 75.2 beats per minute. Other EKG findings include: PVCs.  LEG MOVEMENT DATA The total Periodic Limb Movements of Sleep (PLMS) were 0. The PLMS index  was 0.0. A PLMS index of <15 is considered normal in adults.  IMPRESSIONS - CPAP provided inadequate control at comfortable pressure and was changed to BILEVEL titration. - An optimal BIPAP pressure was selected for this patient ( 22 / 18 cm of water) - Central sleep apnea was not noted during this titration (CAI = 0.2/h). - Mild oxygen desaturations were observed during this titration (min O2 = 88.0%). Minimum saturation on BIPAP 22/18 was 91%. - No snoring was audible during this study. - 2-lead EKG demonstrated: PVCs - Clinically significant periodic limb movements were not noted during this study.   DIAGNOSIS - Obstructive Sleep Apnea (327.23 [G47.33 ICD-10])  RECOMMENDATIONS - Trial of BiPAP therapy on 22/18 cm H2O. Patient used a Medium size Fisher&Paykel Full Face Mask Simplus mask and heated humidification. - Be careful with alcohol, sedatives and other CNS depressants that may worsen sleep apnea and disrupt normal sleep architecture. - Sleep hygiene should be reviewed to assess factors that may improve sleep quality. - Weight management and regular exercise should be initiated or continued.  [Electronically signed] 10/07/2019 03:30 PM  Baird Lyons MD, [place], American Board of Sleep Medicine   NPI: [id number]                          [place], [place] [facility]  ELECTRONICALLY SIGNED ON:  10/07/2019, 3:47 PM [place] PH: [phone]   FX: [phone] [place]

## 2019-10-08 ENCOUNTER — Other Ambulatory Visit: Payer: Self-pay | Admitting: Nurse Practitioner

## 2019-10-08 MED ORDER — MISC. DEVICES MISC: 0 refills | Status: DC

## 2019-10-16 MED FILL — OXYBUTYNIN CL ER 15 MG TAB: 15 | 90 days supply | Qty: 90 | Fill #0

## 2019-10-16 MED FILL — PROAIR HFA 90 MCG INHALER: 108 (90 BAS | 25 days supply | Qty: 9 | Fill #2

## 2019-10-16 MED FILL — FUROSEMIDE 80 MG TAB: 80 | 90 days supply | Qty: 180 | Fill #1

## 2019-10-16 MED FILL — ACCU-CHEK AVIVA PLUS STRP: 100 days supply | Qty: 100 | Fill #1

## 2019-10-16 MED FILL — CARVEDILOL 25 MG TABLET: 25 | 90 days supply | Qty: 180 | Fill #1

## 2019-10-16 MED FILL — glipiZIDE XL 10 MG TB24: 10 | 90 days supply | Qty: 90 | Fill #0

## 2019-11-07 ENCOUNTER — Other Ambulatory Visit: Payer: Self-pay

## 2019-11-07 ENCOUNTER — Other Ambulatory Visit: Payer: Self-pay | Admitting: Family Medicine

## 2019-11-07 ENCOUNTER — Ambulatory Visit: Payer: Medicaid Other | Attending: Nurse Practitioner | Admitting: Nurse Practitioner

## 2019-11-07 ENCOUNTER — Encounter: Payer: Self-pay | Admitting: Nurse Practitioner

## 2019-11-07 VITALS — BP 145/80 | HR 88 | Temp 97.5°F | Ht 67.0 in | Wt 345.0 lb

## 2019-11-07 DIAGNOSIS — Z7901 Long term (current) use of anticoagulants: Secondary | ICD-10-CM | POA: Insufficient documentation

## 2019-11-07 DIAGNOSIS — I272 Pulmonary hypertension, unspecified: Secondary | ICD-10-CM | POA: Diagnosis not present

## 2019-11-07 DIAGNOSIS — I110 Hypertensive heart disease with heart failure: Secondary | ICD-10-CM | POA: Insufficient documentation

## 2019-11-07 DIAGNOSIS — I482 Chronic atrial fibrillation, unspecified: Secondary | ICD-10-CM | POA: Diagnosis not present

## 2019-11-07 DIAGNOSIS — Z79899 Other long term (current) drug therapy: Secondary | ICD-10-CM | POA: Insufficient documentation

## 2019-11-07 DIAGNOSIS — Z6841 Body Mass Index (BMI) 40.0 and over, adult: Secondary | ICD-10-CM | POA: Insufficient documentation

## 2019-11-07 DIAGNOSIS — I5032 Chronic diastolic (congestive) heart failure: Secondary | ICD-10-CM | POA: Diagnosis not present

## 2019-11-07 DIAGNOSIS — E1165 Type 2 diabetes mellitus with hyperglycemia: Secondary | ICD-10-CM | POA: Diagnosis not present

## 2019-11-07 DIAGNOSIS — Z1231 Encounter for screening mammogram for malignant neoplasm of breast: Secondary | ICD-10-CM | POA: Diagnosis not present

## 2019-11-07 DIAGNOSIS — F319 Bipolar disorder, unspecified: Secondary | ICD-10-CM | POA: Diagnosis not present

## 2019-11-07 DIAGNOSIS — Z794 Long term (current) use of insulin: Secondary | ICD-10-CM | POA: Diagnosis not present

## 2019-11-07 DIAGNOSIS — E669 Obesity, unspecified: Secondary | ICD-10-CM

## 2019-11-07 DIAGNOSIS — R0602 Shortness of breath: Secondary | ICD-10-CM | POA: Insufficient documentation

## 2019-11-07 DIAGNOSIS — I10 Essential (primary) hypertension: Secondary | ICD-10-CM | POA: Diagnosis not present

## 2019-11-07 LAB — GLUCOSE, POCT (MANUAL RESULT ENTRY): POC Glucose: 163 mg/dl — AB (ref 70–99)

## 2019-11-07 MED ORDER — MISC. DEVICES MISC: 0 refills | Status: DC

## 2019-11-07 MED FILL — metFORMIN HCL 1000 MG TABS: 1000 | 90 days supply | Qty: 180 | Fill #1

## 2019-11-07 MED FILL — PROAIR HFA 90 MCG INHALER: 108 (90 BAS | 25 days supply | Qty: 9 | Fill #0

## 2019-11-07 NOTE — Progress Notes (Signed)
Established Patient Office Visit  Subjective:  Patient ID: [name], female    DOB: [birth year]  Age: 54 y.o. MRN: [id number]  CC:  Chief Complaint  Patient presents with  . Follow-up    Pt. is here for a follow up.     HPI [name] is a 54 year old female with a history of type 2 diabetes mellitus (A1c 8.1 on 05/23/19), hypertension, chronic diastolic heart failure (EF 60-65% per ECHO in October 2020), persistent atrial fibrillation, bipolar disorder, obesity, and obstructive sleep apnea who presents today for a follow up visit. Patient states that she monitors her blood sugars at home and her postprandial glucose is normally in the 160's.  Endorses medication compliance.  She acknowledges that she does not follow a diabetic diet and does not exercise.  She is up to date on her eye exam.  Her blood pressure is slightly elevated today.  Patient endorses compliance with her antihypertensives.   She does have some shortness of breath which is chronic and unchanged.  Followed by [facility] for management. Endorses compliance with Xarelto for chronic atrial fibrillation. She feels that her bipolar disorder is improved on her current medication regimen.  She has more motivation and is applying for PA school. Sleep study completed last month reflected the need for BiPAP.  This was ordered but the patient has not received it yet.  She denies acute complaints today.   Past Medical History:  Diagnosis Date  . Acute on chronic diastolic congestive heart failure ([place]) 11/02/2013   10/03/2015, 11/13/2015, 08/03/2017  . Benign essential HTN 11/28/2013  . Bipolar disease, chronic ([place])   . Chest pain    a. 2012 Myoview: EF 63%, no isch/infarct;  b. 04/2016 Lexiscan MV: EF 73%, no ischemia/infarct-->Low risk.  . Chronic diastolic CHF (congestive heart failure) ([place]) 07/23/2011   a. 2015 Echo: EF 55-60%, Gr2 DD;  b. 09/2015 Echo: EF 60-65%, no rwma, mod dil LA, PASP 58mHg.  . Cor pulmonale  (chronic) ([place]   . History of thyrotoxicosis   . HTN (hypertension) 11/28/2013  . Hypertensive heart disease 10/18/2013  . Hypoglycemia   . Insulin dependent type 2 diabetes mellitus, uncontrolled ([place]   . Mediastinal adenopathy   . Morbid obesity due to excess calories ([place] 02/19/2011  . Morbid obesity with BMI of 50.0-59.9, adult ([place]   . OSA (obstructive sleep apnea) 03/06/2011  . Persistent atrial fibrillation ([place] 12/09/2017  . Pulmonary HTN, moderate to severe 11/03/2013  . Sinusitis, chronic 01/02/2015  . SVT (supraventricular tachycardia) ([place] 12/06/2013  . Uncontrolled type 2 diabetes mellitus with hyperglycemia ([facility]     Past Surgical History:  Procedure Laterality Date  . CARDIOVERSION N/A 04/05/2018   Procedure: CARDIOVERSION;  Surgeon: CLelon Perla MD;  [facility];  Service: Cardiovascular;  Laterality: N/A;  . COLONOSCOPY WITH PROPOFOL Left 07/16/2018   Procedure: COLONOSCOPY WITH PROPOFOL;  Surgeon: KRonnette Juniper MD;  [facility];  Service: Gastroenterology;  Laterality: Left;  . LEFT HEART CATH AND CORONARY ANGIOGRAPHY N/A 08/04/2018   Procedure: LEFT HEART CATH AND CORONARY ANGIOGRAPHY;  Surgeon: SBelva Crome MD;  [facility];  Service: Cardiovascular;  Laterality: N/A;  . None    . POLYPECTOMY  07/16/2018   Procedure: POLYPECTOMY;  Surgeon: KRonnette Juniper MD;  [facility];  Service: Gastroenterology;;    Family History  Problem Relation Age of Onset  . Heart failure Father   . Stroke Father   . Hypertension Mother   .  Heart disease Maternal Grandfather     Social History   Socioeconomic History  . Marital status: Single    Spouse name: Not on file  . Number of children: 0  . Years of education: 65  . Highest education level: Not on file  Occupational History  . Occupation: unemployed  Tobacco Use  . Smoking status: Never Smoker  . Smokeless tobacco: Never Used  Substance and Sexual Activity  .  Alcohol use: No  . Drug use: No  . Sexual activity: Not Currently    Birth control/protection: None  Other Topics Concern  . 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 But ran out of money and is no longer attending school. (Note patient has bipolar disorder).      Born in [place] but lived in [place] before coming back to [place].       Primary language is Arabic. Lives with mother and brother.   Social Determinants of Health   Financial Resource Strain:   . Difficulty of Paying Living Expenses: Not on file  Food Insecurity:   . Worried About Charity fundraiser in the Last Year: Not on file  . Ran Out of Food in the Last Year: Not on file  Transportation Needs:   . Lack of Transportation (Medical): Not on file  . Lack of Transportation (Non-Medical): Not on file  Physical Activity:   . Days of Exercise per Week: Not on file  . Minutes of Exercise per Session: Not on file  Stress:   . Feeling of Stress : Not on file  Social Connections:   . Frequency of Communication with Friends and Family: Not on file  . Frequency of Social Gatherings with Friends and Family: Not on file  . Attends Religious Services: Not on file  . Active Member of Clubs or Organizations: Not on file  . Attends Archivist Meetings: Not on file  . Marital Status: Not on file  Intimate Partner Violence:   . Fear of Current or Ex-Partner: Not on file  . Emotionally Abused: Not on file  . Physically Abused: Not on file  . Sexually Abused: Not on file    Outpatient Medications Prior to Visit  Medication Sig Dispense Refill  . Accu-Chek Softclix Lancets lancets USE AS DIRECTED TO TEST BLOOD SUGAR ONCE DAILY 100 each 12  . albuterol (PROAIR HFA) 108 (90 Base) MCG/ACT inhaler INHALE 1 TO 2 PUFFS EVERY 6 HOURS AS NEEDED FOR WHEEZING/ SHORTNESS OF BREATH 8.5 g 2  . Blood Glucose Monitoring Suppl  (ACCU-CHEK AVIVA PLUS) w/Device KIT 1 each by Does not apply route daily. USE AS DIRECTED TO TEST BLOOD SUGAR ONCE DAILY 1 kit 0  . Blood Glucose Monitoring Suppl (TRUE METRIX METER) w/Device KIT Use as instructed. Check blood glucose levels by fingerstick twice per day 1 kit 0  . escitalopram (LEXAPRO) 10 MG tablet 1/2 ab PO daily x 2 wks then 1 tab PO daily 30 tablet 1  . glucose blood (ACCU-CHEK AVIVA PLUS) test strip USE AS DIRECTED TO TEST BLOOD SUGAR ONCE DAILY 100 each 12  . metFORMIN (GLUCOPHAGE) 1000 MG tablet Take 1 tablet (1,000 mg total) by mouth 2 (two) times daily with a meal. 180 tablet 2  . oxybutynin (DITROPAN XL) 15 MG 24 hr tablet Take 1 tablet (15 mg total) by mouth at  bedtime. 90 tablet 3  . Potassium Chloride ER 20 MEQ TBCR Take 20 mEq by mouth 2 (two) times daily. 60 tablet 11  . risperidone (RISPERDAL) 4 MG tablet Take 1 tablet (4 mg total) by mouth daily. 90 tablet 1  . rivaroxaban (XARELTO) 20 MG TABS tablet TAKE 1 TABLET (20 MG TOTAL) BY MOUTH DAILY WITH SUPPER. 90 tablet 1  . Misc. Devices MISC Please provide BiPAP machine with the following: Settings 22/18 cm H2O. Medium  size [employer] Full Face Mask Simplus mask and heated  humidification. 1 each 0  . amLODipine (NORVASC) 10 MG tablet Take 1 tablet (10 mg total) by mouth daily. 90 tablet 3  . atorvastatin (LIPITOR) 40 MG tablet Take 1 tablet (40 mg total) by mouth daily at 6 PM. (Patient not taking: Reported on 07/18/2019) 90 tablet 0  . carvedilol (COREG) 25 MG tablet Take 1 tablet (25 mg total) by mouth 2 (two) times daily. 180 tablet 3  . ferrous sulfate (FEROSUL) 325 (65 FE) MG tablet Take 1 tablet (325 mg total) by mouth 2 (two) times daily. (Patient not taking: Reported on 05/23/2019) 60 tablet 3  . furosemide (LASIX) 80 MG tablet Take 1 tablet (80 mg total) by mouth 2 (two) times daily. 180 tablet 1  . glipiZIDE (GLIPIZIDE XL) 10 MG 24 hr tablet Take 1 tablet (10 mg total) by mouth daily with breakfast. 90  tablet 2  . losartan (COZAAR) 100 MG tablet Take 1 tablet (100 mg total) by mouth daily. 90 tablet 3   No facility-administered medications prior to visit.    Allergies  Allergen Reactions  . Acetaminophen Other (See Comments)    Seizure-like "fits" as a child  . Caffeine     Tense, anxiety, increased urination  . [place] [Dapagliflozin] Other (See Comments)    Hallucinations, drop in blood sugar  . Lisinopril Rash    Rash with lisinopril; but fosinopril is ok per patient    ROS Review of Systems  Constitutional: Negative for fatigue, fever and unexpected weight change.  HENT: Negative for congestion, rhinorrhea, sinus pressure and sinus pain.   Eyes: Negative for visual disturbance.  Respiratory: Positive for shortness of breath. Negative for cough and chest tightness.   Cardiovascular: Positive for leg swelling. Negative for chest pain and palpitations.       Bilateral  Gastrointestinal: Negative for abdominal pain, constipation, nausea and vomiting.  Endocrine: Negative for polydipsia and polyuria.  Genitourinary: Negative for decreased urine volume, difficulty urinating and dysuria.  Musculoskeletal: Negative for arthralgias and myalgias.  Skin: Negative for color change and rash.  Neurological: Negative for dizziness, tremors, weakness and numbness.  Hematological: Bruises/bleeds easily.       Xarelto  Psychiatric/Behavioral: Negative for agitation and behavioral problems.      Objective:    Physical Exam  Constitutional: She is oriented to person, place, and time. She appears well-developed and well-nourished. No distress.  HENT:  Head: Normocephalic and atraumatic.  Eyes: Pupils are equal, round, and reactive to light. Conjunctivae and EOM are normal.  Cardiovascular: Normal rate, normal heart sounds and intact distal pulses. An irregular rhythm present.  Pulmonary/Chest: Breath sounds normal. No respiratory distress. She has no rales.  Abdominal: Soft. Bowel  sounds are normal. There is no abdominal tenderness. There is no guarding.  Musculoskeletal:        General: Edema present. Normal range of motion.     Comments: +1 pitting edema in bilateral lower extremities  Neurological: She  is alert and oriented to person, place, and time.  Skin: Skin is warm and dry. No rash noted.  Psychiatric: She has a normal mood and affect. Her behavior is normal.  Nursing note and vitals reviewed.   BP (!) 145/80 (BP Location: Left Arm, Patient Position: Sitting, Cuff Size: Large)   Pulse 88   Temp (!) 97.5 F (36.4 C) (Temporal)   Ht '5\' 7"'  (1.702 m)   Wt (!) 345 lb (156.5 kg)   SpO2 97%   BMI 54.03 kg/m  Wt Readings from Last 3 Encounters:  11/07/19 (!) 345 lb (156.5 kg)  10/03/19 (!) 347 lb (157.4 kg)  08/15/19 (!) 320 lb (145.2 kg)     Health Maintenance Due  Topic Date Due  . FOOT EXAM  02/22/1978  . PAP SMEAR-Modifier  07/28/2008  . MAMMOGRAM  02/22/2018  . URINE MICROALBUMIN  09/17/2019    There are no preventive care reminders to display for this patient.  Lab Results  Component Value Date   TSH 2.910 12/09/2017   Lab Results  Component Value Date   WBC 6.0 04/09/2019   HGB 11.3 (L) 04/09/2019   HCT 36.8 04/09/2019   MCV 86.2 04/09/2019   PLT 196 04/09/2019   Lab Results  Component Value Date   NA 137 07/18/2019   K 3.8 07/18/2019   CO2 25 07/18/2019   GLUCOSE 184 (H) 07/18/2019   BUN 16 07/18/2019   CREATININE 0.89 07/18/2019   BILITOT 0.5 07/18/2019   ALKPHOS 128 (H) 07/18/2019   AST 25 07/18/2019   ALT 16 07/18/2019   PROT 7.2 07/18/2019   ALBUMIN 3.9 07/18/2019   CALCIUM 9.7 07/18/2019   ANIONGAP 10 04/09/2019   GFR 117.88 12/25/2013   Lab Results  Component Value Date   CHOL 146 05/23/2019   Lab Results  Component Value Date   HDL 44 05/23/2019   Lab Results  Component Value Date   LDLCALC 68 05/23/2019   Lab Results  Component Value Date   TRIG 168 (H) 05/23/2019   Lab Results  Component Value  Date   CHOLHDL 3.3 05/23/2019   Lab Results  Component Value Date   HGBA1C 8.1 (A) 05/23/2019      Assessment & Plan:   1. Uncontrolled type 2 diabetes mellitus with hyperglycemia ([place]) Uncontrolled with an A1c of 8.1 on 05/23/2019.  Send out A1c today.  Goal is < 7. Continue medication regimen Counseled on Diabetic diet, 150 minutes of moderate intensity exercise/week Keep blood sugar logs with fasting goals of 80-120 mg/dl, random of less than 180 and in the event of sugars less than 60 mg/dl or greater than 400 mg/dl please notify the clinic ASAP. Up to date on eye exam Foot exam completed today - Glucose (CBG) - Hemoglobin A1c - Microalbumin/Creatinine Ratio, Urine  2. Breast cancer screening by mammogram Screening - MM 3D SCREEN BREAST BILATERAL; Future  3. Essential hypertension Slightly elevated today Continue medication regimen Counseled on blood pressure goal of less than 130/80, low-sodium diet, medication compliance, 150 minutes of moderate intensity exercise per week. Discussed medication compliance, adverse effects. - CMP14+EGFR - CBC   Meds ordered this encounter  Medications  . Misc. Devices MISC    Sig: Please provide BiPAP machine with the following: Settings 22/18 cm H2O. Medium  size [employer] Full Face Mask Simplus mask and heated  humidification.    Dispense:  1 each    Refill:  0    Order Specific Question:  Supervising Provider    Answer:   Charlott Rakes [[id number]]    Follow-up: No follow-ups on file.    Tomasita Morrow, RN

## 2019-11-07 NOTE — Progress Notes (Signed)
Assessment & Plan:  [name] was seen today for follow-up.  Diagnoses and all orders for this visit:  Uncontrolled type 2 diabetes mellitus with hyperglycemia (HCC) -     Glucose (CBG) -     Hemoglobin A1c -     Microalbumin/Creatinine Ratio, Urine    Patient has been counseled on age-appropriate routine health concerns for screening and prevention. These are reviewed and up-to-date. Referrals have been placed accordingly. Immunizations are up-to-date or declined.    Subjective:   Chief Complaint  Patient presents with  . Follow-up    Pt. is here for a follow up.    HPI [name] [age] y.o. female presents to office today f  ROS  Past Medical History:  Diagnosis Date  . Acute on chronic diastolic congestive heart failure ([place]) 11/02/2013   10/03/2015, 11/13/2015, 08/03/2017  . Benign essential HTN 11/28/2013  . Bipolar disease, chronic ([place])   . Chest pain    a. 2012 Myoview: EF 63%, no isch/infarct;  b. 04/2016 Lexiscan MV: EF 73%, no ischemia/infarct-->Low risk.  . Chronic diastolic CHF (congestive heart failure) ([place]) 07/23/2011   a. 2015 Echo: EF 55-60%, Gr2 DD;  b. 09/2015 Echo: EF 60-65%, no rwma, mod dil LA, PASP 37mHg.  . Cor pulmonale (chronic) ([place]   . History of thyrotoxicosis   . HTN (hypertension) 11/28/2013  . Hypertensive heart disease 10/18/2013  . Hypoglycemia   . Insulin dependent type 2 diabetes mellitus, uncontrolled ([place]   . Mediastinal adenopathy   . Morbid obesity due to excess calories ([place] 02/19/2011  . Morbid obesity with BMI of 50.0-59.9, adult ([place]   . OSA (obstructive sleep apnea) 03/06/2011  . Persistent atrial fibrillation ([place] 12/09/2017  . Pulmonary HTN, moderate to severe 11/03/2013  . Sinusitis, chronic 01/02/2015  . SVT (supraventricular tachycardia) ([place] 12/06/2013  . Uncontrolled type 2 diabetes mellitus with hyperglycemia ([facility]     Past Surgical History:  Procedure Laterality Date  . CARDIOVERSION N/A 04/05/2018   Procedure:  CARDIOVERSION;  Surgeon: CLelon Perla MD;  [facility];  Service: Cardiovascular;  Laterality: N/A;  . COLONOSCOPY WITH PROPOFOL Left 07/16/2018   Procedure: COLONOSCOPY WITH PROPOFOL;  Surgeon: KRonnette Juniper MD;  [facility];  Service: Gastroenterology;  Laterality: Left;  . LEFT HEART CATH AND CORONARY ANGIOGRAPHY N/A 08/04/2018   Procedure: LEFT HEART CATH AND CORONARY ANGIOGRAPHY;  Surgeon: SBelva Crome MD;  [facility];  Service: Cardiovascular;  Laterality: N/A;  . None    . POLYPECTOMY  07/16/2018   Procedure: POLYPECTOMY;  Surgeon: KRonnette Juniper MD;  [facility];  Service: Gastroenterology;;    Family History  Problem Relation Age of Onset  . Heart failure Father   . Stroke Father   . Hypertension Mother   . Heart disease Maternal Grandfather     Social History Reviewed with no changes to be made today.   Outpatient Medications Prior to Visit  Medication Sig Dispense Refill  . Accu-Chek Softclix Lancets lancets USE AS DIRECTED TO TEST BLOOD SUGAR ONCE DAILY 100 each 12  . albuterol (PROAIR HFA) 108 (90 Base) MCG/ACT inhaler INHALE 1 TO 2 PUFFS EVERY 6 HOURS AS NEEDED FOR WHEEZING/ SHORTNESS OF BREATH 8.5 g 2  . Blood Glucose Monitoring Suppl (ACCU-CHEK AVIVA PLUS) w/Device KIT 1 each by Does not apply route daily. USE AS DIRECTED TO TEST BLOOD SUGAR ONCE DAILY 1 kit 0  . Blood Glucose Monitoring Suppl (TRUE METRIX METER) w/Device KIT Use as  instructed. Check blood glucose levels by fingerstick twice per day 1 kit 0  . escitalopram (LEXAPRO) 10 MG tablet 1/2 ab PO daily x 2 wks then 1 tab PO daily 30 tablet 1  . glucose blood (ACCU-CHEK AVIVA PLUS) test strip USE AS DIRECTED TO TEST BLOOD SUGAR ONCE DAILY 100 each 12  . metFORMIN (GLUCOPHAGE) 1000 MG tablet Take 1 tablet (1,000 mg total) by mouth 2 (two) times daily with a meal. 180 tablet 2  . Misc. Devices MISC Please provide BiPAP machine with the following: Settings 22/18  cm H2O. Medium  size [employer] Full Face Mask Simplus mask and heated  humidification. 1 each 0  . oxybutynin (DITROPAN XL) 15 MG 24 hr tablet Take 1 tablet (15 mg total) by mouth at bedtime. 90 tablet 3  . Potassium Chloride ER 20 MEQ TBCR Take 20 mEq by mouth 2 (two) times daily. 60 tablet 11  . risperidone (RISPERDAL) 4 MG tablet Take 1 tablet (4 mg total) by mouth daily. 90 tablet 1  . rivaroxaban (XARELTO) 20 MG TABS tablet TAKE 1 TABLET (20 MG TOTAL) BY MOUTH DAILY WITH SUPPER. 90 tablet 1  . amLODipine (NORVASC) 10 MG tablet Take 1 tablet (10 mg total) by mouth daily. 90 tablet 3  . atorvastatin (LIPITOR) 40 MG tablet Take 1 tablet (40 mg total) by mouth daily at 6 PM. (Patient not taking: Reported on 07/18/2019) 90 tablet 0  . carvedilol (COREG) 25 MG tablet Take 1 tablet (25 mg total) by mouth 2 (two) times daily. 180 tablet 3  . ferrous sulfate (FEROSUL) 325 (65 FE) MG tablet Take 1 tablet (325 mg total) by mouth 2 (two) times daily. (Patient not taking: Reported on 05/23/2019) 60 tablet 3  . furosemide (LASIX) 80 MG tablet Take 1 tablet (80 mg total) by mouth 2 (two) times daily. 180 tablet 1  . glipiZIDE (GLIPIZIDE XL) 10 MG 24 hr tablet Take 1 tablet (10 mg total) by mouth daily with breakfast. 90 tablet 2  . losartan (COZAAR) 100 MG tablet Take 1 tablet (100 mg total) by mouth daily. 90 tablet 3   No facility-administered medications prior to visit.    Allergies  Allergen Reactions  . Acetaminophen Other (See Comments)    Seizure-like "fits" as a child  . Caffeine     Tense, anxiety, increased urination  . [place] [Dapagliflozin] Other (See Comments)    Hallucinations, drop in blood sugar  . Lisinopril Rash    Rash with lisinopril; but fosinopril is ok per patient       Objective:    BP (!) 145/80 (BP Location: Left Arm, Patient Position: Sitting, Cuff Size: Large)   Pulse 88   Temp (!) 97.5 F (36.4 C) (Temporal)   Ht '5\' 7"'  (1.702 m)   Wt (!) 345 lb (156.5  kg)   SpO2 97%   BMI 54.03 kg/m  Wt Readings from Last 3 Encounters:  11/07/19 (!) 345 lb (156.5 kg)  10/03/19 (!) 347 lb (157.4 kg)  08/15/19 (!) 320 lb (145.2 kg)    Physical Exam       Patient has been counseled extensively about nutrition and exercise as well as the importance of adherence with medications and regular follow-up. The patient was given clear instructions to go to ER or return to medical center if symptoms don't improve, worsen or new problems develop. The patient verbalized understanding.   Follow-up: No follow-ups on file.   [name], FNP-BC [place]  [facility] and [place], [place]   11/07/2019, 1:53 PM

## 2019-11-09 LAB — CMP14+EGFR
ALT: 12 IU/L (ref 0–32)
AST: 20 IU/L (ref 0–40)
Albumin/Globulin Ratio: 1.2 (ref 1.2–2.2)
Albumin: 4.1 g/dL (ref 3.8–4.9)
Alkaline Phosphatase: 132 IU/L — ABNORMAL HIGH (ref 39–117)
BUN/Creatinine Ratio: 15 (ref 9–23)
BUN: 13 mg/dL (ref 6–24)
Bilirubin Total: 0.5 mg/dL (ref 0.0–1.2)
CO2: 23 mmol/L (ref 20–29)
Calcium: 9.3 mg/dL (ref 8.7–10.2)
Chloride: 102 mmol/L (ref 96–106)
Creatinine, Ser: 0.85 mg/dL (ref 0.57–1.00)
GFR calc Af Amer: 92 mL/min/{1.73_m2} (ref >59)
GFR calc non Af Amer: 80 mL/min/{1.73_m2} (ref >59)
Globulin, Total: 3.3 g/dL (ref 1.5–4.5)
Glucose: 154 mg/dL — ABNORMAL HIGH (ref 65–99)
Potassium: 3.8 mmol/L (ref 3.5–5.2)
Sodium: 141 mmol/L (ref 134–144)
Total Protein: 7.4 g/dL (ref 6.0–8.5)

## 2019-11-09 LAB — MICROALBUMIN / CREATININE URINE RATIO
Creatinine, Urine: 79.7 mg/dL
Microalb/Creat Ratio: 1620 mg/g creat — ABNORMAL HIGH (ref 0–29)
Microalbumin, Urine: 1291.3 ug/mL

## 2019-11-09 LAB — CBC
Hematocrit: 34.5 % (ref 34.0–46.6)
Hemoglobin: 11.2 g/dL (ref 11.1–15.9)
MCH: 27.6 pg (ref 26.6–33.0)
MCHC: 32.5 g/dL (ref 31.5–35.7)
MCV: 85 fL (ref 79–97)
Platelets: 224 10*3/uL (ref 150–450)
RBC: 4.06 x10E6/uL (ref 3.77–5.28)
RDW: 13.6 % (ref 11.7–15.4)
WBC: 5.8 10*3/uL (ref 3.4–10.8)

## 2019-11-09 LAB — HEMOGLOBIN A1C
Est. average glucose Bld gHb Est-mCnc: 171 mg/dL
Hgb A1c MFr Bld: 7.6 % — ABNORMAL HIGH (ref 4.8–5.6)

## 2019-11-19 ENCOUNTER — Encounter: Payer: Self-pay | Admitting: Nurse Practitioner

## 2019-12-08 ENCOUNTER — Encounter (HOSPITAL_COMMUNITY): Payer: Self-pay

## 2019-12-08 ENCOUNTER — Inpatient Hospital Stay (HOSPITAL_COMMUNITY)
Admission: EM | Admit: 2019-12-08 | Discharge: 2019-12-19 | DRG: 291 | Disposition: A | Discharge status: Home / Self Care | Payer: Medicaid Other | Attending: Internal Medicine | Admitting: Internal Medicine

## 2019-12-08 ENCOUNTER — Emergency Department (HOSPITAL_COMMUNITY): Payer: Medicaid Other

## 2019-12-08 ENCOUNTER — Other Ambulatory Visit: Payer: Self-pay

## 2019-12-08 DIAGNOSIS — Z6841 Body Mass Index (BMI) 40.0 and over, adult: Secondary | ICD-10-CM | POA: Diagnosis present

## 2019-12-08 DIAGNOSIS — Z886 Allergy status to analgesic agent status: Secondary | ICD-10-CM

## 2019-12-08 DIAGNOSIS — Z888 Allergy status to other drugs, medicaments and biological substances status: Secondary | ICD-10-CM

## 2019-12-08 DIAGNOSIS — Z8249 Family history of ischemic heart disease and other diseases of the circulatory system: Secondary | ICD-10-CM

## 2019-12-08 DIAGNOSIS — E669 Obesity, unspecified: Secondary | ICD-10-CM

## 2019-12-08 DIAGNOSIS — E785 Hyperlipidemia, unspecified: Secondary | ICD-10-CM | POA: Diagnosis present

## 2019-12-08 DIAGNOSIS — R52 Pain, unspecified: Secondary | ICD-10-CM

## 2019-12-08 DIAGNOSIS — E66813 Obesity, class 3: Secondary | ICD-10-CM | POA: Diagnosis present

## 2019-12-08 DIAGNOSIS — F32A Depression, unspecified: Secondary | ICD-10-CM | POA: Diagnosis present

## 2019-12-08 DIAGNOSIS — T502X5A Adverse effect of carbonic-anhydrase inhibitors, benzothiadiazides and other diuretics, initial encounter: Secondary | ICD-10-CM | POA: Diagnosis not present

## 2019-12-08 DIAGNOSIS — I5031 Acute diastolic (congestive) heart failure: Secondary | ICD-10-CM

## 2019-12-08 DIAGNOSIS — I509 Heart failure, unspecified: Secondary | ICD-10-CM

## 2019-12-08 DIAGNOSIS — F329 Major depressive disorder, single episode, unspecified: Secondary | ICD-10-CM | POA: Diagnosis present

## 2019-12-08 DIAGNOSIS — Z7984 Long term (current) use of oral hypoglycemic drugs: Secondary | ICD-10-CM

## 2019-12-08 DIAGNOSIS — E119 Type 2 diabetes mellitus without complications: Secondary | ICD-10-CM

## 2019-12-08 DIAGNOSIS — Z8639 Personal history of other endocrine, nutritional and metabolic disease: Secondary | ICD-10-CM

## 2019-12-08 DIAGNOSIS — F3162 Bipolar disorder, current episode mixed, moderate: Secondary | ICD-10-CM | POA: Diagnosis present

## 2019-12-08 DIAGNOSIS — I2729 Other secondary pulmonary hypertension: Secondary | ICD-10-CM | POA: Diagnosis present

## 2019-12-08 DIAGNOSIS — R079 Chest pain, unspecified: Secondary | ICD-10-CM | POA: Diagnosis not present

## 2019-12-08 DIAGNOSIS — E1165 Type 2 diabetes mellitus with hyperglycemia: Secondary | ICD-10-CM | POA: Diagnosis present

## 2019-12-08 DIAGNOSIS — E662 Morbid (severe) obesity with alveolar hypoventilation: Secondary | ICD-10-CM | POA: Diagnosis present

## 2019-12-08 DIAGNOSIS — E873 Alkalosis: Secondary | ICD-10-CM | POA: Diagnosis not present

## 2019-12-08 DIAGNOSIS — D649 Anemia, unspecified: Secondary | ICD-10-CM | POA: Diagnosis not present

## 2019-12-08 DIAGNOSIS — E876 Hypokalemia: Secondary | ICD-10-CM | POA: Diagnosis not present

## 2019-12-08 DIAGNOSIS — I5033 Acute on chronic diastolic (congestive) heart failure: Secondary | ICD-10-CM | POA: Diagnosis present

## 2019-12-08 DIAGNOSIS — J9601 Acute respiratory failure with hypoxia: Secondary | ICD-10-CM | POA: Diagnosis present

## 2019-12-08 DIAGNOSIS — I10 Essential (primary) hypertension: Secondary | ICD-10-CM | POA: Diagnosis present

## 2019-12-08 DIAGNOSIS — R05 Cough: Secondary | ICD-10-CM | POA: Diagnosis not present

## 2019-12-08 DIAGNOSIS — I110 Hypertensive heart disease with heart failure: Principal | ICD-10-CM | POA: Diagnosis present

## 2019-12-08 DIAGNOSIS — I4819 Other persistent atrial fibrillation: Secondary | ICD-10-CM | POA: Diagnosis present

## 2019-12-08 DIAGNOSIS — I4891 Unspecified atrial fibrillation: Secondary | ICD-10-CM | POA: Diagnosis present

## 2019-12-08 DIAGNOSIS — Z79899 Other long term (current) drug therapy: Secondary | ICD-10-CM

## 2019-12-08 DIAGNOSIS — Z20822 Contact with and (suspected) exposure to covid-19: Secondary | ICD-10-CM | POA: Diagnosis present

## 2019-12-08 DIAGNOSIS — Z9981 Dependence on supplemental oxygen: Secondary | ICD-10-CM

## 2019-12-08 LAB — BASIC METABOLIC PANEL
Anion gap: 11 (ref 5–15)
BUN: 10 mg/dL (ref 6–20)
CO2: 24 mmol/L (ref 22–32)
Calcium: 9.6 mg/dL (ref 8.9–10.3)
Chloride: 107 mmol/L (ref 98–111)
Creatinine, Ser: 0.72 mg/dL (ref 0.44–1.00)
GFR calc Af Amer: >60 mL/min (ref >60)
GFR calc non Af Amer: >60 mL/min (ref >60)
Glucose, Bld: 204 mg/dL — ABNORMAL HIGH (ref 70–99)
Potassium: 3.7 mmol/L (ref 3.5–5.1)
Sodium: 142 mmol/L (ref 135–145)

## 2019-12-08 LAB — I-STAT BETA HCG BLOOD, ED (MC, WL, AP ONLY): I-stat hCG, quantitative: <5 m[IU]/mL (ref <5)

## 2019-12-08 LAB — CBC
HCT: 34.9 % — ABNORMAL LOW (ref 36.0–46.0)
Hemoglobin: 10.6 g/dL — ABNORMAL LOW (ref 12.0–15.0)
MCH: 26.2 pg (ref 26.0–34.0)
MCHC: 30.4 g/dL (ref 30.0–36.0)
MCV: 86.2 fL (ref 80.0–100.0)
Platelets: 231 10*3/uL (ref 150–400)
RBC: 4.05 MIL/uL (ref 3.87–5.11)
RDW: 15.3 % (ref 11.5–15.5)
WBC: 5.5 10*3/uL (ref 4.0–10.5)
nRBC: 0 % (ref 0.0–0.2)

## 2019-12-08 LAB — TROPONIN I (HIGH SENSITIVITY)
Troponin I (High Sensitivity): 4 ng/L (ref <18)
Troponin I (High Sensitivity): 12 ng/L (ref <18)

## 2019-12-08 MED ORDER — SODIUM CHLORIDE 0.9% FLUSH: 3.0000 mL | Freq: Once | INTRAVENOUS | Status: DC

## 2019-12-08 MED ORDER — ALBUTEROL SULFATE HFA 108 (90 BASE) MCG/ACT IN AERS
2.0000 | INHALATION_SPRAY | RESPIRATORY_TRACT | Status: DC | PRN
  Administered 2019-12-08: 2 via RESPIRATORY_TRACT
  Filled 2019-12-08: qty 6.7

## 2019-12-08 NOTE — ED Triage Notes (Signed)
Pt arrives via POV w/ c/o cough, sob, chest pain, and diarrhea x 2 days. Pt denies fever, n/v.

## 2019-12-09 ENCOUNTER — Inpatient Hospital Stay (HOSPITAL_COMMUNITY): Payer: Medicaid Other

## 2019-12-09 DIAGNOSIS — Z9981 Dependence on supplemental oxygen: Secondary | ICD-10-CM | POA: Diagnosis not present

## 2019-12-09 DIAGNOSIS — I5033 Acute on chronic diastolic (congestive) heart failure: Secondary | ICD-10-CM

## 2019-12-09 DIAGNOSIS — I509 Heart failure, unspecified: Secondary | ICD-10-CM | POA: Diagnosis not present

## 2019-12-09 DIAGNOSIS — Z6841 Body Mass Index (BMI) 40.0 and over, adult: Secondary | ICD-10-CM | POA: Diagnosis not present

## 2019-12-09 DIAGNOSIS — E1110 Type 2 diabetes mellitus with ketoacidosis without coma: Secondary | ICD-10-CM | POA: Diagnosis not present

## 2019-12-09 DIAGNOSIS — R05 Cough: Secondary | ICD-10-CM | POA: Diagnosis not present

## 2019-12-09 DIAGNOSIS — I2729 Other secondary pulmonary hypertension: Secondary | ICD-10-CM | POA: Diagnosis present

## 2019-12-09 DIAGNOSIS — J9601 Acute respiratory failure with hypoxia: Secondary | ICD-10-CM | POA: Diagnosis not present

## 2019-12-09 DIAGNOSIS — Z888 Allergy status to other drugs, medicaments and biological substances status: Secondary | ICD-10-CM | POA: Diagnosis not present

## 2019-12-09 DIAGNOSIS — E669 Obesity, unspecified: Secondary | ICD-10-CM | POA: Diagnosis not present

## 2019-12-09 DIAGNOSIS — I5031 Acute diastolic (congestive) heart failure: Secondary | ICD-10-CM | POA: Diagnosis not present

## 2019-12-09 DIAGNOSIS — I480 Paroxysmal atrial fibrillation: Secondary | ICD-10-CM

## 2019-12-09 DIAGNOSIS — G4733 Obstructive sleep apnea (adult) (pediatric): Secondary | ICD-10-CM | POA: Diagnosis not present

## 2019-12-09 DIAGNOSIS — Z794 Long term (current) use of insulin: Secondary | ICD-10-CM | POA: Diagnosis not present

## 2019-12-09 DIAGNOSIS — E785 Hyperlipidemia, unspecified: Secondary | ICD-10-CM | POA: Diagnosis present

## 2019-12-09 DIAGNOSIS — E876 Hypokalemia: Secondary | ICD-10-CM | POA: Diagnosis not present

## 2019-12-09 DIAGNOSIS — R079 Chest pain, unspecified: Secondary | ICD-10-CM | POA: Diagnosis not present

## 2019-12-09 DIAGNOSIS — F3162 Bipolar disorder, current episode mixed, moderate: Secondary | ICD-10-CM | POA: Diagnosis not present

## 2019-12-09 DIAGNOSIS — I10 Essential (primary) hypertension: Secondary | ICD-10-CM | POA: Diagnosis not present

## 2019-12-09 DIAGNOSIS — T502X5A Adverse effect of carbonic-anhydrase inhibitors, benzothiadiazides and other diuretics, initial encounter: Secondary | ICD-10-CM | POA: Diagnosis not present

## 2019-12-09 DIAGNOSIS — Z8249 Family history of ischemic heart disease and other diseases of the circulatory system: Secondary | ICD-10-CM | POA: Diagnosis not present

## 2019-12-09 DIAGNOSIS — E1165 Type 2 diabetes mellitus with hyperglycemia: Secondary | ICD-10-CM | POA: Diagnosis not present

## 2019-12-09 DIAGNOSIS — Z20822 Contact with and (suspected) exposure to covid-19: Secondary | ICD-10-CM | POA: Diagnosis not present

## 2019-12-09 DIAGNOSIS — F320 Major depressive disorder, single episode, mild: Secondary | ICD-10-CM | POA: Diagnosis not present

## 2019-12-09 DIAGNOSIS — E118 Type 2 diabetes mellitus with unspecified complications: Secondary | ICD-10-CM | POA: Diagnosis not present

## 2019-12-09 DIAGNOSIS — D649 Anemia, unspecified: Secondary | ICD-10-CM | POA: Diagnosis not present

## 2019-12-09 DIAGNOSIS — I208 Other forms of angina pectoris: Secondary | ICD-10-CM | POA: Diagnosis not present

## 2019-12-09 DIAGNOSIS — Z79899 Other long term (current) drug therapy: Secondary | ICD-10-CM | POA: Diagnosis not present

## 2019-12-09 DIAGNOSIS — I482 Chronic atrial fibrillation, unspecified: Secondary | ICD-10-CM | POA: Diagnosis not present

## 2019-12-09 DIAGNOSIS — Z7984 Long term (current) use of oral hypoglycemic drugs: Secondary | ICD-10-CM | POA: Diagnosis not present

## 2019-12-09 DIAGNOSIS — E7800 Pure hypercholesterolemia, unspecified: Secondary | ICD-10-CM | POA: Diagnosis not present

## 2019-12-09 DIAGNOSIS — I4819 Other persistent atrial fibrillation: Secondary | ICD-10-CM | POA: Diagnosis not present

## 2019-12-09 DIAGNOSIS — Z886 Allergy status to analgesic agent status: Secondary | ICD-10-CM | POA: Diagnosis not present

## 2019-12-09 DIAGNOSIS — E662 Morbid (severe) obesity with alveolar hypoventilation: Secondary | ICD-10-CM | POA: Diagnosis not present

## 2019-12-09 DIAGNOSIS — I110 Hypertensive heart disease with heart failure: Secondary | ICD-10-CM | POA: Diagnosis not present

## 2019-12-09 DIAGNOSIS — E873 Alkalosis: Secondary | ICD-10-CM | POA: Diagnosis not present

## 2019-12-09 LAB — BASIC METABOLIC PANEL
Anion gap: 9 (ref 5–15)
BUN: 12 mg/dL (ref 6–20)
CO2: 29 mmol/L (ref 22–32)
Calcium: 9.3 mg/dL (ref 8.9–10.3)
Chloride: 101 mmol/L (ref 98–111)
Creatinine, Ser: 0.79 mg/dL (ref 0.44–1.00)
GFR calc Af Amer: >60 mL/min (ref >60)
GFR calc non Af Amer: >60 mL/min (ref >60)
Glucose, Bld: 168 mg/dL — ABNORMAL HIGH (ref 70–99)
Potassium: 3.7 mmol/L (ref 3.5–5.1)
Sodium: 139 mmol/L (ref 135–145)

## 2019-12-09 LAB — SARS CORONAVIRUS 2 (TAT 6-24 HRS): SARS Coronavirus 2: NEGATIVE

## 2019-12-09 LAB — HIV ANTIBODY (ROUTINE TESTING W REFLEX): HIV Screen 4th Generation wRfx: NONREACTIVE

## 2019-12-09 LAB — GLUCOSE, CAPILLARY
Glucose-Capillary: 190 mg/dL — ABNORMAL HIGH (ref 70–99)
Glucose-Capillary: 237 mg/dL — ABNORMAL HIGH (ref 70–99)
Glucose-Capillary: 190 mg/dL — ABNORMAL HIGH (ref 70–99)

## 2019-12-09 LAB — ECHOCARDIOGRAM COMPLETE
Height: 67 in
Weight: 5600 oz

## 2019-12-09 LAB — BRAIN NATRIURETIC PEPTIDE: B Natriuretic Peptide: 391.6 pg/mL — ABNORMAL HIGH (ref 0.0–100.0)

## 2019-12-09 LAB — CBG MONITORING, ED: Glucose-Capillary: 151 mg/dL — ABNORMAL HIGH (ref 70–99)

## 2019-12-09 MED ORDER — ESCITALOPRAM OXALATE 10 MG PO TABS
10.0000 mg | ORAL_TABLET | Freq: Every day | ORAL | Status: DC
  Administered 2019-12-09 – 2019-12-19 (×11): 10 mg via ORAL
  Filled 2019-12-09 (×11): qty 1

## 2019-12-09 MED ORDER — FUROSEMIDE 10 MG/ML IJ SOLN
40.0000 mg | Freq: Once | INTRAMUSCULAR | Status: AC
  Administered 2019-12-09: 40 mg via INTRAVENOUS
  Filled 2019-12-09: qty 4

## 2019-12-09 MED ORDER — RIVAROXABAN 20 MG PO TABS: 20.0000 mg | ORAL_TABLET | Freq: Every day | ORAL | Status: DC

## 2019-12-09 MED ORDER — RISPERIDONE 1 MG PO TABS
4.0000 mg | ORAL_TABLET | Freq: Every day | ORAL | Status: DC
  Administered 2019-12-09 – 2019-12-19 (×11): 4 mg via ORAL
  Filled 2019-12-09 (×4): qty 4
  Filled 2019-12-09: qty 1
  Filled 2019-12-09 (×6): qty 4
  Filled 2019-12-09: qty 2

## 2019-12-09 MED ORDER — SODIUM CHLORIDE 0.9 % IV SOLN: 250.0000 mL | INTRAVENOUS | Status: DC | PRN

## 2019-12-09 MED ORDER — BENZONATATE 100 MG PO CAPS
200.0000 mg | ORAL_CAPSULE | Freq: Three times a day (TID) | ORAL | Status: DC | PRN
  Administered 2019-12-15: 200 mg via ORAL
  Filled 2019-12-09 (×2): qty 2

## 2019-12-09 MED ORDER — SODIUM CHLORIDE 0.9% FLUSH: 3.0000 mL | INTRAVENOUS | Status: DC | PRN

## 2019-12-09 MED ORDER — AMLODIPINE BESYLATE 10 MG PO TABS
10.0000 mg | ORAL_TABLET | Freq: Every day | ORAL | Status: DC
  Administered 2019-12-09 – 2019-12-19 (×11): 10 mg via ORAL
  Filled 2019-12-09: qty 1
  Filled 2019-12-09: qty 2
  Filled 2019-12-09 (×9): qty 1

## 2019-12-09 MED ORDER — OXYBUTYNIN CHLORIDE ER 15 MG PO TB24
15.0000 mg | ORAL_TABLET | Freq: Every day | ORAL | Status: DC
  Administered 2019-12-09 – 2019-12-18 (×10): 15 mg via ORAL
  Filled 2019-12-09 (×11): qty 1

## 2019-12-09 MED ORDER — FUROSEMIDE 10 MG/ML IJ SOLN
60.0000 mg | Freq: Two times a day (BID) | INTRAMUSCULAR | Status: DC
  Administered 2019-12-09 – 2019-12-12 (×7): 60 mg via INTRAVENOUS
  Filled 2019-12-09 (×7): qty 6

## 2019-12-09 MED ORDER — INSULIN ASPART 100 UNIT/ML ~~LOC~~ SOLN
0.0000 [IU] | Freq: Three times a day (TID) | SUBCUTANEOUS | Status: DC
  Administered 2019-12-09: 2 [IU] via SUBCUTANEOUS
  Administered 2019-12-09: 3 [IU] via SUBCUTANEOUS
  Administered 2019-12-09: 2 [IU] via SUBCUTANEOUS
  Administered 2019-12-10: 3 [IU] via SUBCUTANEOUS
  Administered 2019-12-10: 2 [IU] via SUBCUTANEOUS

## 2019-12-09 MED ORDER — INSULIN ASPART 100 UNIT/ML ~~LOC~~ SOLN: 0.0000 [IU] | Freq: Every day | SUBCUTANEOUS | Status: DC

## 2019-12-09 MED ORDER — DM-GUAIFENESIN ER 30-600 MG PO TB12
1.0000 | ORAL_TABLET | Freq: Two times a day (BID) | ORAL | Status: DC
  Administered 2019-12-09 – 2019-12-19 (×21): 1 via ORAL
  Filled 2019-12-09 (×21): qty 1

## 2019-12-09 MED ORDER — CARVEDILOL 25 MG PO TABS
25.0000 mg | ORAL_TABLET | Freq: Two times a day (BID) | ORAL | Status: DC
  Administered 2019-12-09 – 2019-12-19 (×21): 25 mg via ORAL
  Filled 2019-12-09 (×16): qty 1
  Filled 2019-12-09: qty 2
  Filled 2019-12-09 (×5): qty 1

## 2019-12-09 MED ORDER — ASPIRIN 81 MG PO CHEW
324.0000 mg | CHEWABLE_TABLET | Freq: Once | ORAL | Status: AC
  Administered 2019-12-09: 02:00:00 324 mg via ORAL
  Filled 2019-12-09: qty 4

## 2019-12-09 MED ORDER — LOSARTAN POTASSIUM 50 MG PO TABS
100.0000 mg | ORAL_TABLET | Freq: Every day | ORAL | Status: DC
  Administered 2019-12-09: 100 mg via ORAL
  Filled 2019-12-09: qty 2

## 2019-12-09 MED ORDER — NITROGLYCERIN 0.4 MG SL SUBL
0.4000 mg | SUBLINGUAL_TABLET | SUBLINGUAL | Status: AC | PRN
  Administered 2019-12-09 (×3): 0.4 mg via SUBLINGUAL
  Filled 2019-12-09: qty 1

## 2019-12-09 MED ORDER — SODIUM CHLORIDE 0.9% FLUSH
3.0000 mL | Freq: Two times a day (BID) | INTRAVENOUS | Status: DC
  Administered 2019-12-09 – 2019-12-19 (×20): 3 mL via INTRAVENOUS

## 2019-12-09 MED ORDER — ATORVASTATIN CALCIUM 40 MG PO TABS
40.0000 mg | ORAL_TABLET | Freq: Every day | ORAL | Status: DC
  Administered 2019-12-09 – 2019-12-18 (×10): 40 mg via ORAL
  Filled 2019-12-09 (×11): qty 1

## 2019-12-09 NOTE — ED Notes (Addendum)
Pt coughed up bright red blood. Noticed 1 small blood clot. Pt continues to have strong cough.   Dr. Marlowe Sax informed. Orders to be placed. Hold Xarelto

## 2019-12-09 NOTE — ED Notes (Signed)
Pt pacing the room. Continues to request meal/fluids. Dr. Marlowe Sax paged in regards to pt request.

## 2019-12-09 NOTE — Progress Notes (Signed)
  Echocardiogram 2D Echocardiogram has been performed.  [name] 12/09/2019, 3:48 PM

## 2019-12-09 NOTE — ED Provider Notes (Signed)
TIME SEEN: 1:50 AM  CHIEF COMPLAINT: Chest pain, shortness of breath, lower extremity swelling, cough  HPI: Patient is a 54 year old female with history of diastolic heart failure, hypertension, insulin-dependent diabetes, obesity, pulmonary hypertension who presents to the emergency department with 5 days of progressively worsening left-sided chest pressure, shortness of breath, dry cough and bilateral lower extremity swelling.  States symptoms worse with lying flat and exertion.  She is unable to lay flat at this time.  Is on Lasix 40 mg twice daily and reports compliance.  She is not sure why she is become volume overloaded.  She states this feels like her CHF exacerbations.  She is requesting admission.  She states she does wear oxygen as needed at home.  She denies fevers.  No Covid exposures.  Echo 07/24/19:   IMPRESSIONS    1. Left ventricular ejection fraction, by visual estimation, is 60 to  65%. The left ventricle has normal function. There is moderately increased  left ventricular hypertrophy.  2. Global right ventricle has normal systolic function.The right  ventricular size is normal. No increase in right ventricular wall  thickness.  3. Left atrial size was severely dilated.  4. Right atrial size was severely dilated.  5. The mitral valve is normal in structure. Mild mitral valve  regurgitation.  6. The tricuspid valve is normal in structure. Tricuspid valve  regurgitation is trivial.  7. The aortic valve is normal in structure. Aortic valve regurgitation  was not visualized by color flow Doppler.  8. The pulmonic valve was grossly normal. Pulmonic valve regurgitation is  not visualized by color flow Doppler.  9. Mildly elevated pulmonary artery systolic pressure.  10. The inferior vena cava is normal in size with greater than 50%  respiratory variability, suggesting right atrial pressure of 3 mmHg.  11. The interatrial septum was not assessed.    LHC  08/04/2018:   Widely patent coronary arteries.  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  Courses between PA and AO.  Mild dynamic compression is noted.  Hypercontractile left ventricle.  Marked elevation in LVEDP, 26 mmHg.  EF greater than 65%.  Ventricular fibrillation treated with defibrillation x1.  Conversion to normal sinus rhythm after defibrillation.  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  Resume anticoagulation, likely Xarelto since coronaries are clean.  Further management per treating team.  Discontinue nitroglycerin IV.  ROS: See HPI Constitutional: no fever  Eyes: no drainage  ENT: no runny nose   Cardiovascular:   chest pain  Resp:  SOB  GI: no vomiting GU: no dysuria Integumentary: no rash  Allergy: no hives  Musculoskeletal: no leg swelling  Neurological: no slurred speech ROS otherwise negative  PAST MEDICAL HISTORY/PAST SURGICAL HISTORY:  Past Medical History:  Diagnosis Date  . Acute on chronic diastolic congestive heart failure ([place]) 11/02/2013   10/03/2015, 11/13/2015, 08/03/2017  . Benign essential HTN 11/28/2013  . Bipolar disease, chronic ([place])   . Chest pain    a. 2012 Myoview: EF 63%, no isch/infarct;  b. 04/2016 Lexiscan MV: EF 73%, no ischemia/infarct-->Low risk.  . Chronic diastolic CHF (congestive heart failure) ([place]) 07/23/2011   a. 2015 Echo: EF 55-60%, Gr2 DD;  b. 09/2015 Echo: EF 60-65%, no rwma, mod dil LA, PASP 37mHg.  . Cor pulmonale (chronic) ([place]   . History of thyrotoxicosis   . HTN (hypertension) 11/28/2013  . Hypertensive heart disease 10/18/2013  . Hypoglycemia   . Insulin dependent type 2  diabetes mellitus, uncontrolled ([place])   . Mediastinal adenopathy   . Morbid obesity due to excess calories ([place]) 02/19/2011  . Morbid obesity with BMI of 50.0-59.9, adult ([place])   . OSA (obstructive sleep apnea) 03/06/2011  . Persistent atrial fibrillation  ([place]) 12/09/2017  . Pulmonary HTN, moderate to severe 11/03/2013  . Sinusitis, chronic 01/02/2015  . SVT (supraventricular tachycardia) ([place]) 12/06/2013  . Uncontrolled type 2 diabetes mellitus with hyperglycemia ([facility])     MEDICATIONS:  Prior to Admission medications   Medication Sig Start Date End Date Taking? Authorizing Provider  Accu-Chek Softclix Lancets lancets USE AS DIRECTED TO TEST BLOOD SUGAR ONCE DAILY 05/23/19   [name], NP  albuterol [facility] HFA) 108 (90 Base) MCG/ACT inhaler INHALE 1 TO 2 PUFFS EVERY 6 HOURS AS NEEDED FOR WHEEZING/ SHORTNESS OF BREATH 11/07/19   [name], NP  amLODipine (NORVASC) 10 MG tablet Take 1 tablet (10 mg total) by mouth daily. 05/23/19 08/21/19  [name], NP  atorvastatin (LIPITOR) 40 MG tablet Take 1 tablet (40 mg total) by mouth daily at 6 PM. Patient not taking: Reported on 07/18/2019 05/23/19   [name], NP  Blood Glucose Monitoring Suppl (ACCU-CHEK AVIVA PLUS) w/Device KIT 1 each by Does not apply route daily. USE AS DIRECTED TO TEST BLOOD SUGAR ONCE DAILY 08/23/18   [name], NP  Blood Glucose Monitoring Suppl (TRUE METRIX METER) w/Device KIT Use as instructed. Check blood glucose levels by fingerstick twice per day 08/16/18   [name], NP  carvedilol (COREG) 25 MG tablet Take 1 tablet (25 mg total) by mouth 2 (two) times daily. 05/23/19 06/22/19  [name], NP  escitalopram (LEXAPRO) 10 MG tablet 1/2 ab PO daily x 2 wks then 1 tab PO daily 07/13/19   Ladell Pier, MD  ferrous sulfate (FEROSUL) 325 (65 FE) MG tablet Take 1 tablet (325 mg total) by mouth 2 (two) times daily. Patient not taking: Reported on 05/23/2019 10/04/18   Erlene Quan, PA-C  furosemide (LASIX) 80 MG tablet Take 1 tablet (80 mg total) by mouth 2 (two) times daily. 07/18/19 10/16/19  [name], NP  glipiZIDE (GLIPIZIDE XL) 10 MG 24 hr tablet Take 1 tablet (10 mg total) by mouth daily with breakfast. 05/23/19 08/21/19  [name], NP  glucose blood (ACCU-CHEK AVIVA PLUS) test strip USE AS DIRECTED TO TEST BLOOD SUGAR ONCE DAILY 05/23/19   [name], NP  losartan (COZAAR) 100 MG tablet Take 1 tablet (100 mg total) by mouth daily. 05/23/19 08/21/19  [name], NP  metFORMIN (GLUCOPHAGE) 1000 MG tablet Take 1 tablet (1,000 mg total) by mouth 2 (two) times daily with a meal. 07/18/19   [name], NP  Misc. Devices MISC Please provide BiPAP machine with the following: Settings 22/18 cm H2O. Medium  size [employer] Full Face Mask Simplus mask and heated  humidification. 11/07/19   [name], NP  oxybutynin (DITROPAN XL) 15 MG 24 hr tablet Take 1 tablet (15 mg total) by mouth at bedtime. 05/23/19   [name], NP  Potassium Chloride ER 20 MEQ TBCR Take 20 mEq by mouth 2 (two) times daily. 05/23/19   [name], NP  risperidone (RISPERDAL) 4 MG tablet Take 1 tablet (4 mg total) by mouth daily. 07/18/19   [name], NP  rivaroxaban (XARELTO) 20 MG TABS tablet TAKE 1 TABLET (20 MG TOTAL) BY MOUTH DAILY  WITH SUPPER. 07/18/19   [name], NP    ALLERGIES:  Allergies  Allergen Reactions  . Acetaminophen Other (See Comments)    Seizure-like "fits" as a child  . Caffeine     Tense, anxiety, increased urination  . [place] [Dapagliflozin] Other (See Comments)    Hallucinations, drop in blood sugar  . Lisinopril Rash    Rash with lisinopril; but fosinopril is ok per patient    SOCIAL HISTORY:  Social History   Tobacco Use  . Smoking status: Never Smoker  . Smokeless tobacco: Never Used  Substance Use Topics  . Alcohol use: No    FAMILY HISTORY: Family History  Problem Relation Age of Onset  . Heart failure Father   . Stroke Father   . Hypertension Mother   . Heart disease Maternal Grandfather     EXAM: BP (!) 148/104   Pulse 100   Temp 98.5 F (36.9 C) (Oral)   Resp 20   Ht 5' 7" (1.702 m)   Wt (!) 151 kg   SpO2 99%   BMI 52.16 kg/m   CONSTITUTIONAL: Alert and oriented and responds appropriately to questions.  Obese, appears short of breath but not in distress HEAD: Normocephalic EYES: Conjunctivae clear, pupils appear equal, EOM appear intact ENT: normal nose; moist mucous membranes NECK: Supple, normal ROM CARD: Irregularly irregular and tachycardic; S1 and S2 appreciated; no murmurs, no clicks, no rubs, no gallops RESP: Patient is tachypneic just sitting upright in the bed talking to me.  Her sats however stat 98% on room air.  She does has bibasilar rales on exam and some diminished aeration but no wheezing or rhonchi.  She is speaking short sentences but not in distress. ABD/GI: Normal bowel sounds; non-distended; soft, non-tender, no rebound, no guarding, no peritoneal signs, no hepatosplenomegaly BACK:  The back appears normal EXT: Normal ROM in all joints; no deformity noted, symmetric nonpitting edema to the knees bilaterally, extremities are warm and well perfused, no redness or warmth SKIN: Normal color for age and race; warm; no rash on exposed skin NEURO: Moves all extremities equally PSYCH: The patient's mood and manner are appropriate.   MEDICAL DECISION MAKING: Patient here with CHF exacerbation.  Reports compliance with her Lasix at home.  Patient has mildly hypertensive here and is in atrial fibrillation with a rate in the 100s.  We will closely monitor this to determine if she needs to be on diltiazem infusion.  I feel she does need IV diuresis and given she appears short of breath sitting upright in the bed just talking to me, I feel she will need admission.  She is still having active chest pressure.  Will give nitroglycerin, aspirin.  Her high-sensitivity troponin is negative.  BNP pending.  Chest x-ray confirms CHF exacerbation.  ED PROGRESS: 2:00 AM Discussed patient's case with hospitalist, Dr. Marlowe Sax.  I have recommended admission and patient (and family if present) agree with this plan. Admitting  physician will place admission orders.   I reviewed all nursing notes, vitals, pertinent previous records and interpreted all EKGs, lab and urine results, imaging (as available).      EKG Interpretation  Date/Time:  Friday December 08 2019 20:39:21 EST Ventricular Rate:  111 PR Interval:    QRS Duration: 66 QT Interval:  338 QTC Calculation: 459 R Axis:   111 Text Interpretation: Atrial fibrillation with rapid ventricular response Low voltage QRS Anterolateral infarct , age undetermined Abnormal ECG No significant change since last tracing Confirmed  by Pryor Curia [phone]) on 12/09/2019 1:51:00 AM        CRITICAL CARE Performed by: Cyril Mourning    Total critical care time: 40 minutes  Critical care time was exclusive of separately billable procedures and treating other patients.  Critical care was necessary to treat or prevent imminent or life-threatening deterioration.  Critical care was time spent personally by me on the following activities: development of treatment plan with patient and/or surrogate as well as nursing, discussions with consultants, evaluation of patient's response to treatment, examination of patient, obtaining history from patient or surrogate, ordering and performing treatments and interventions, ordering and review of laboratory studies, ordering and review of radiographic studies, pulse oximetry and re-evaluation of patient's condition.   MEQAS Buboltz was evaluated in Emergency Department on 12/09/2019 for the symptoms described in the history of present illness. She was evaluated in the context of the global COVID-19 pandemic, which necessitated consideration that the patient might be at risk for infection with the SARS-CoV-2 virus that causes COVID-19. Institutional protocols and algorithms that pertain to the evaluation of patients at risk for COVID-19 are in a state of rapid change based on information released by regulatory bodies including the CDC and  federal and state organizations. These policies and algorithms were followed during the patient's care in the ED.  Patient was seen wearing N95, face shield, gloves, gown.    , Delice Bison, DO 12/09/19 0200

## 2019-12-09 NOTE — TOC Initial Note (Signed)
Transition of Care [facility]) - Initial/Assessment Note    Patient Details  Name: [name] MRN: [id number] Date of Birth: 05/09/1966  [facility] CM/SW Contact:    Oretha Milch, LCSW Phone Number: 12/09/2019, 10:41 AM  Clinical Narrative: CSW acknowledges consult for skilled nursing facility placement. TOC will follow patient for PT/OT recommendations and provide appropriate discharge supports pending their evaluation.                      Patient Goals and CMS Choice        Expected Discharge Plan and Services                                                Prior Living Arrangements/Services                       Activities of Daily Living      Permission Sought/Granted                  Emotional Assessment              Admission diagnosis:  Acute exacerbation of CHF (congestive heart failure) ([place]) [I50.9] Patient Active Problem List   Diagnosis Date Noted  . Acute exacerbation of CHF (congestive heart failure) ([place]) 12/09/2019  . ILD (interstitial lung disease) ([place]) 05/28/2019  . Exertional angina ([place]) 05/28/2019  . AKI (acute kidney injury) ([place])   . Acute CHF (congestive heart failure) ([place]) 08/03/2018  . A-fib ([place]) 08/03/2018  . Vitamin B12 deficiency 07/20/2018  . Hematochezia 07/14/2018  . Acute posthemorrhagic anemia 07/14/2018  . GIB (gastrointestinal bleeding) 07/14/2018  . Chronic anticoagulation 07/14/2018  . Persistent atrial fibrillation   . Uncontrolled type 2 diabetes mellitus with hyperglycemia ([place])   . Hypokalemia   . Cor pulmonale (chronic) ([place])   . 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 Pyrexia   . Dyspnea 11/13/2015  . Mediastinal adenopathy 11/13/2015  . Acute on chronic diastolic heart failure ([place]) 11/13/2015  . Abnormal CT scan, chest 11/11/2015  . Chest pain 11/11/2015  . Essential hypertension 03/07/2015  . Depression (emotion) 03/07/2015  . Noninfectious  gastroenteritis and colitis 01/02/2015  . Sinusitis, chronic 01/02/2015  . Midline low back pain without sciatica 09/10/2014  . Bipolar 1 disorder, mixed, moderate ([place]) 07/02/2014  . Stress incontinence 07/02/2014  . Mania ([place]) 12/10/2013  . Speech abnormality 12/08/2013  . SVT (supraventricular tachycardia) ([place]) 12/06/2013  . Benign essential HTN 11/28/2013  . HTN (hypertension) 11/28/2013  . Type 2 diabetes mellitus ([place]) 11/28/2013  . Pulmonary HTN, moderate to severe 11/03/2013  . Acute on chronic diastolic congestive heart failure ([place]) 11/02/2013  . Hypertensive heart disease 10/18/2013  . Chronic diastolic heart failure ([place]) 07/23/2011  . OSA (obstructive sleep apnea)- non compliant with C-pap 03/06/2011  . Morbid obesity ([place]) 02/19/2011  . Non-ST elevation (NSTEMI) myocardial infarction ([place]) 04/29/2009  . Bipolar disorder    PCP:  [name], NP Pharmacy:   [facility], [place] [address] [place] [address] Phone: [phone] Fax: [phone]     Social Determinants of Health (SDOH) Interventions    Readmission Risk Interventions No flowsheet data found.

## 2019-12-09 NOTE — H&P (Addendum)
History and Physical    [name] YHO:[id number] DOB: 21-Mar-1966 DOA: 12/08/2019  PCP: [name], NP Patient coming from: Home  Chief Complaint: Chest pain, shortness of breath, lower extremity edema  HPI: [name] is a 54 y.o. female with medical history significant of chronic diastolic congestive heart failure, hypertension, persistent atrial fibrillation, pulmonary hypertension, SVT, type 2 diabetes, morbid obesity (BMI 52), OSA, bipolar disorder presented to ED with complaints of chest pain, shortness of breath, and lower extremity edema.  Patient reports 1 week history of progressively worsening dyspnea, productive cough, orthopnea, paroxysmal nocturnal dyspnea, abdominal distention, and bilateral lower extremity edema.  States she has gained 15 pounds in the past 1 month.  She takes Lasix 80 mg in the morning and 40 mg in the evening and has not missed any doses.  Does state that she consumes a lot of fluids.  Denies fevers or sick contacts.  States yesterday morning while doing household chores she experienced substernal/left-sided pressure-like chest pain which persisted the entire day and finally resolved while she was in the ED.  No other complaints.  ED Course: Afebrile.  Slightly tachycardic and blood pressure slightly elevated.  Not hypoxic.  Labs showing no leukocytosis.  Hemoglobin 10.6, no significant change from baseline.  High-sensitivity troponin negative x2.  EKG without acute ischemic changes.  BNP elevated at 391.  SARS-CoV-2 PCR test pending. Chest x-ray showing cardiomegaly with vascular congestion and mild diffuse interstitial opacity, probably representing mild edema.  Patient received aspirin 324 mg, IV Lasix 40 mg, sublingual nitroglycerin, and albuterol.  Review of Systems:  All systems reviewed and apart from history of presenting illness, are negative.  Past Medical History:  Diagnosis Date  . Acute on chronic diastolic congestive heart failure ([place])  11/02/2013   10/03/2015, 11/13/2015, 08/03/2017  . Benign essential HTN 11/28/2013  . Bipolar disease, chronic ([place])   . Chest pain    a. 2012 Myoview: EF 63%, no isch/infarct;  b. 04/2016 Lexiscan MV: EF 73%, no ischemia/infarct-->Low risk.  . Chronic diastolic CHF (congestive heart failure) ([place]) 07/23/2011   a. 2015 Echo: EF 55-60%, Gr2 DD;  b. 09/2015 Echo: EF 60-65%, no rwma, mod dil LA, PASP 23mHg.  . Cor pulmonale (chronic) ([facility]   . History of thyrotoxicosis   . HTN (hypertension) 11/28/2013  . Hypertensive heart disease 10/18/2013  . Hypoglycemia   . Insulin dependent type 2 diabetes mellitus, uncontrolled ([facility]   . Mediastinal adenopathy   . Morbid obesity due to excess calories ([facility] 02/19/2011  . Morbid obesity with BMI of 50.0-59.9, adult ([facility]   . OSA (obstructive sleep apnea) 03/06/2011  . Persistent atrial fibrillation ([facility] 12/09/2017  . Pulmonary HTN, moderate to severe 11/03/2013  . Sinusitis, chronic 01/02/2015  . SVT (supraventricular tachycardia) ([facility] 12/06/2013  . Uncontrolled type 2 diabetes mellitus with hyperglycemia ([facility]     Past Surgical History:  Procedure Laterality Date  . CARDIOVERSION N/A 04/05/2018   Procedure: CARDIOVERSION;  Surgeon: CLelon Perla MD;  [facility];  Service: Cardiovascular;  Laterality: N/A;  . COLONOSCOPY WITH PROPOFOL Left 07/16/2018   Procedure: COLONOSCOPY WITH PROPOFOL;  Surgeon: KRonnette Juniper MD;  Location: [facility] ENDOSCOPY;  Service: Gastroenterology;  Laterality: Left;  . LEFT HEART CATH AND CORONARY ANGIOGRAPHY N/A 08/04/2018   Procedure: LEFT HEART CATH AND CORONARY ANGIOGRAPHY;  Surgeon: SBelva Crome MD;  [facility];  Service: Cardiovascular;  Laterality: N/A;  . None    . POLYPECTOMY  07/16/2018  Procedure: POLYPECTOMY;  Surgeon: Ronnette Juniper, MD;  Location: [facility] ENDOSCOPY;  Service: Gastroenterology;;     reports that she has never smoked. She has never used smokeless tobacco. She reports that she  does not drink alcohol or use drugs.  Allergies  Allergen Reactions  . Acetaminophen Other (See Comments)    Seizure-like "fits" as a child  . Caffeine     Tense, anxiety, increased urination  . [place] [Dapagliflozin] Other (See Comments)    Hallucinations, drop in blood sugar  . Lisinopril Rash    Rash with lisinopril; but fosinopril is ok per patient    Family History  Problem Relation Age of Onset  . Heart failure Father   . Stroke Father   . Hypertension Mother   . Heart disease Maternal Grandfather     Prior to Admission medications   Medication Sig Start Date End Date Taking? Authorizing Provider  albuterol (PROAIR HFA) 108 (90 Base) MCG/ACT inhaler INHALE 1 TO 2 PUFFS EVERY 6 HOURS AS NEEDED FOR WHEEZING/ SHORTNESS OF BREATH 11/07/19  Yes [name], NP  amLODipine (NORVASC) 10 MG tablet Take 10 mg by mouth daily.   Yes [provider]  carvedilol (COREG) 25 MG tablet Take 25 mg by mouth 2 (two) times daily with a meal.   Yes [provider]  escitalopram (LEXAPRO) 10 MG tablet 1/2 ab PO daily x 2 wks then 1 tab PO daily Patient taking differently: Take 10 mg by mouth daily.  07/13/19  Yes Ladell Pier, MD  ferrous sulfate (FEROSUL) 325 (65 FE) MG tablet Take 1 tablet (325 mg total) by mouth 2 (two) times daily. Patient taking differently: Take 325 mg by mouth See admin instructions. Pt states she takes sometimes 10/04/18  Yes Kilroy, Doreene Burke, PA-C  furosemide (LASIX) 80 MG tablet Take 80 mg by mouth 2 (two) times daily.   Yes [provider]  glipiZIDE (GLUCOTROL XL) 10 MG 24 hr tablet Take 10 mg by mouth every evening.   Yes [provider]  losartan (COZAAR) 100 MG tablet Take 100 mg by mouth daily.   Yes [provider]  metFORMIN (GLUCOPHAGE) 1000 MG tablet Take 1 tablet (1,000 mg total) by mouth 2 (two) times daily with a meal. 07/18/19  Yes [name], NP  oxybutynin (DITROPAN XL) 15 MG 24 hr tablet Take 1  tablet (15 mg total) by mouth at bedtime. 05/23/19  Yes [name], NP  Potassium Chloride ER 20 MEQ TBCR Take 20 mEq by mouth 2 (two) times daily. 05/23/19  Yes [name], NP  risperidone (RISPERDAL) 4 MG tablet Take 1 tablet (4 mg total) by mouth daily. 07/18/19  Yes [name], NP  rivaroxaban (XARELTO) 20 MG TABS tablet TAKE 1 TABLET (20 MG TOTAL) BY MOUTH DAILY WITH SUPPER. 07/18/19  Yes [name], NP  Accu-Chek Softclix Lancets lancets USE AS DIRECTED TO TEST BLOOD SUGAR ONCE DAILY 05/23/19   [name], NP  atorvastatin (LIPITOR) 40 MG tablet Take 1 tablet (40 mg total) by mouth daily at 6 PM. Patient not taking: Reported on 07/18/2019 05/23/19   [name], NP  Blood Glucose Monitoring Suppl (ACCU-CHEK AVIVA PLUS) w/Device KIT 1 each by Does not apply route daily. USE AS DIRECTED TO TEST BLOOD SUGAR ONCE DAILY 08/23/18   [name], NP  Blood Glucose Monitoring Suppl (TRUE METRIX METER) w/Device KIT Use as instructed. Check blood glucose levels by fingerstick twice per  day 08/16/18   [name], NP  glucose blood (ACCU-CHEK AVIVA PLUS) test strip USE AS DIRECTED TO TEST BLOOD SUGAR ONCE DAILY 05/23/19   [name], NP  Misc. Devices MISC Please provide BiPAP machine with the following: Settings 22/18 cm H2O. Medium  size [employer] Full Face Mask Simplus mask and heated  humidification. 11/07/19   [name], NP    Physical Exam: Vitals:   12/09/19 0230 12/09/19 0245 12/09/19 0300 12/09/19 0330  BP: (!) 153/109 (!) 149/86 (!) 143/97 (!) 142/94  Pulse: 95 95 99 91  Resp: (!) 21 (!) 23 (!) 23 18  Temp:      TempSrc:      SpO2: 97% 95% 95% 99%  Weight:      Height:        Physical Exam  Constitutional: She is oriented to person, place, and time. She appears well-developed and well-nourished.  HENT:  Head: Normocephalic.  Eyes: Right eye exhibits no discharge. Left eye exhibits no discharge.  Neck: JVD present.    Cardiovascular: Normal rate, regular rhythm and intact distal pulses.  Pulmonary/Chest: Effort normal. She has no wheezes. She has rales.  Coughing aggressively  Abdominal: Soft. Bowel sounds are normal. She exhibits distension. There is no abdominal tenderness. There is no rebound and no guarding.  Musculoskeletal:        General: Edema present.     Cervical back: Neck supple.     Comments: Significant bilateral lower extremity edema  Neurological: She is alert and oriented to person, place, and time.  Skin: Skin is warm and dry. She is not diaphoretic.     Labs on Admission: I have personally reviewed following labs and imaging studies  CBC: Recent Labs  Lab 12/08/19 2052  WBC 5.5  HGB 10.6*  HCT 34.9*  MCV 86.2  PLT 093   Basic Metabolic Panel: Recent Labs  Lab 12/08/19 2052  NA 142  K 3.7  CL 107  CO2 24  GLUCOSE 204*  BUN 10  CREATININE 0.72  CALCIUM 9.6   GFR: Estimated Creatinine Clearance: 127.9 mL/min (by C-G formula based on SCr of 0.72 mg/dL). Liver Function Tests: No results for input(s): AST, ALT, ALKPHOS, BILITOT, PROT, ALBUMIN in the last 168 hours. No results for input(s): LIPASE, AMYLASE in the last 168 hours. No results for input(s): AMMONIA in the last 168 hours. Coagulation Profile: No results for input(s): INR, PROTIME in the last 168 hours. Cardiac Enzymes: No results for input(s): CKTOTAL, CKMB, CKMBINDEX, TROPONINI in the last 168 hours. BNP (last 3 results) No results for input(s): PROBNP in the last 8760 hours. HbA1C: No results for input(s): HGBA1C in the last 72 hours. CBG: No results for input(s): GLUCAP in the last 168 hours. Lipid Profile: No results for input(s): CHOL, HDL, LDLCALC, TRIG, CHOLHDL, LDLDIRECT in the last 72 hours. Thyroid Function Tests: No results for input(s): TSH, T4TOTAL, FREET4, T3FREE, THYROIDAB in the last 72 hours. Anemia Panel: No results for input(s): VITAMINB12, FOLATE, FERRITIN, TIBC, IRON,  RETICCTPCT in the last 72 hours. Urine analysis:    Component Value Date/Time   COLORURINE YELLOW 08/06/2018 0753   APPEARANCEUR CLEAR 08/06/2018 0753   LABSPEC 1.018 08/06/2018 0753   PHURINE 5.0 08/06/2018 0753   GLUCOSEU 50 (A) 08/06/2018 0753   HGBUR MODERATE (A) 08/06/2018 [place] NEGATIVE 08/06/2018 0753   BILIRUBINUR neg 09/10/2014 1219   KETONESUR NEGATIVE 08/06/2018 0753   PROTEINUR 100 (A) 08/06/2018 0753  UROBILINOGEN 1.0 09/10/2014 1219   UROBILINOGEN 0.2 12/07/2013 0041   NITRITE NEGATIVE 08/06/2018 0753   LEUKOCYTESUR NEGATIVE 08/06/2018 0753    Radiological Exams on Admission: DG Chest 1 View  Result Date: 12/08/2019 CLINICAL DATA:  Cough short of breath and chest pain EXAM: CHEST  1 VIEW COMPARISON:  04/09/2019 FINDINGS: Cardiomegaly with vascular congestion. Mild diffuse interstitial opacity. No pleural effusion. No consolidation. No pneumothorax. IMPRESSION: Cardiomegaly with vascular congestion and mild diffuse interstitial opacity, probably representing mild edema. Electronically Signed   By: Donavan Foil M.D.   On: 12/08/2019 22:54    EKG: Independently reviewed.  Atrial fibrillation, heart rate 111.  No acute ischemic changes.  Assessment/Plan Principal Problem:   Acute exacerbation of CHF (congestive heart failure) (HCC) Active Problems:   HTN (hypertension)   Type 2 diabetes mellitus (HCC)   Chest pain   A-fib (HCC)   Acute exacerbation of chronic diastolic congestive heart failure: Appears volume overloaded on exam.  There is mild elevation of BNP at 391, although could be falsely low in the setting of morbid obesity.  Chest x-ray showing cardiomegaly with vascular congestion and mild diffuse interstitial opacity, probably representing mild edema.  No fever or leukocytosis to suggest pneumonia.  Not hypoxic.  Plan is to continue diuresis with IV Lasix 60 mg twice daily, repeat echocardiogram, monitor intake and output, daily weights,  low-sodium diet with fluid restriction, and supplemental oxygen if needed.  Continue home Coreg and losartan.  Chest pain: Suspect related to acutely decompensated CHF.  ACS less likely given high-sensitivity troponin negative x2 and EKG without acute ischemic changes.  Cardiac catheterization done November 2019 showing clean coronaries.  Received full dose aspirin in the ED.  Continue cardiac monitoring and diuresis for decompensated CHF as mentioned above.  Hypertension: Currently stable.  Continue home amlodipine, Coreg, and losartan.  Persistent atrial fibrillation: Currently rate controlled.  Continue home Coreg and Xarelto. Addendum: Hold Xarelto as nursing staff reported that patient had an episode of hemoptysis in the ED after coughing aggressively.  Hemodynamically stable and not hypoxic.  Continue to monitor closely and please reevaluate in a.m.  Type 2 diabetes: Not on insulin.  Last A1c 7.6 on 11/07/2019.  Continue sliding scale insulin sensitive ACHS and CBG checks.  DVT prophylaxis: No anticoagulation at this time Code Status: Full code Family Communication: No family available at this time. Disposition Plan: Needs IV diuresis for significant volume overload/dyspnea.  She will likely need to stay in the hospital for greater than 2 midnights. Consults called: None Admission status: It is my clinical opinion that admission to INPATIENT is reasonable and necessary because of the expectation that this patient will require hospital care that crosses at least 2 midnights to treat this condition based on the medical complexity of the problems presented.  Given the aforementioned information, the predictability of an adverse outcome is felt to be significant.  The medical decision making on this patient was of high complexity and the patient is at high risk for clinical deterioration, therefore this is a level 3 visit.  Shela Leff MD [facility]  If 7PM-7AM, please contact  night-coverage [email or web address]  12/09/2019, 4:38 AM

## 2019-12-09 NOTE — Progress Notes (Addendum)
PROGRESS NOTE    [name]  [id number] DOB: [birth year] DOA: 12/08/2019 PCP: [name], NP    Brief Narrative:  Patient was admitted to the hospital with working diagnosis of acute decompensation of diastolic heart failure, complicated with cardiogenic pulmonary edema  54 year old female who presented with chest pain, dyspnea lower extremity edema.  She does have significant past medical history for chronic diastolic heart failure, hypertension, atrial fibrillation, pulmonary hypertension, type 2 diabetes mellitus, obstructive sleep apnea and morbid obesity.    Patient reported 1 week of progressive and worsening dyspnea, associated with productive cough, orthopnea, PND, increased abdominal girth and lower extremity edema.  She gained 15 pounds in the last 4 weeks.  24 hours prior to hospitalization she developed substernal left-sided chest pressure, which was persistent.  Her initial physical examination blood pressure 149/86 heart rate 95, respiratory rate 23, oxygen saturation 95% her lungs had rales bilaterally, positive JVD, heart S1-S2, present rhythmic, abdomen distended, positive lower extremity edema.  Sodium 142, potassium 3.7, chloride 107, bicarb 24, glucose 204, BUN 10, creatinine 0.72, BNP 391, troponin 94, white count 5.5, hemoglobin 10.6, hematocrit 34.9, platelets 231.  SARS COVID-19 negative.  Chest radiograph with cardiomegaly, bilateral interstitial infiltrates with vascular congestion.  EKG had 111 bpm, normal axis, no QRS prolongation, atrial fibrillation rhythm, poor R wave progression, no ST segment or T wave changes, low voltage.   Patient was admitted to the telemetry ward, and started with aggressive diuresis with intravenous furosemide.  Assessment & Plan:   Principal Problem:   Acute exacerbation of CHF (congestive heart failure) (HCC) Active Problems:   HTN (hypertension)   Type 2 diabetes mellitus (HCC)   Chest pain   A-fib ([place])   1. Acute on  chronic diastolic heart failure decompensation. Blood pressure has remained stable 122/79 mmHg, no documentation in urine output but patient with mild improvement of her symptoms.   Will continue diuresis with IV furosemide to target negative fluid balance, follow on renal panel today and daily. Strict in and out and daily weights.   Continue with carvedilol.   Patient continue to be at high risk for worsening heart failure decompensation.   2. HTN. Continue blood pressure control with amlodipine for now. Will hold on losartan.   3. Atrial fibrillation chronic. Continue rate control with carvedilol, no signs of further bleeding, epistaxis, hgb has been stable, will resume anticoagulation with rivaroxaban.   4. Uncontrolled T2DM (Hgb A1c 7,6)/ dyslipidemia. Will continue glucose control and monitoring with insulin sliding scale. Patient is tolerating po well. Capillary glucose has been 151 and 190.   Continue with atorvastatin.   5. Obesity class 3 with BMI >40. Patient will need aggressive lifestyle modification, will consult nutrition for teaching.   6. Depression. Continue with risperidone and escitalopram.   DVT prophylaxis: rivaroxaban   Code Status:  full Family Communication: no family at the bedside  Disposition Plan/ discharge barriers: patient from home barrier for dc decompensated heart failure, requiring IV diuresis and close inpatient monitoring.    Subjective: Patient dyspnea has improved but not back to baseline, continue to have symptoms with minimal efforts, positive lower extremity edema and limited mobility.   Objective: Vitals:   12/09/19 0545 12/09/19 0630 12/09/19 0940 12/09/19 1158  BP: (!) 149/80 (!) 153/89 122/79 129/78  Pulse: 99 89 92 88  Resp: 18 17 20 20   Temp:   98.4 F (36.9 C) 98.4 F (36.9 C)  TempSrc:   Oral Oral  SpO2: 92% 90%  95% 98%  Weight:    (!) 158.8 kg  Height:    5\' 7"  (1.702 m)    Intake/Output Summary (Last 24 hours) at 12/09/2019  1339 Last data filed at 12/09/2019 0946 Gross per 24 hour  Intake 10 ml  Output --  Net 10 ml   Filed Weights   12/09/19 0105 12/09/19 1158  Weight: (!) 151 kg (!) 158.8 kg    Examination:   General: deconditioned, positive dyspnea at rest Neurology: Awake and alert, non focal. E ENT: no pallor, no icterus, oral mucosa moist Cardiovascular: wide neck no visible JVD. S1-S2 present, irregularly irregular, no gallops, rubs, or murmurs. +++ pitting bilateral lower extremity edema. Pulmonary: positive breath sounds bilaterally, decreased air movement, no wheezing, rhonchi or rales. Gastrointestinal. Abdomen protuberant, no organomegaly, non tender, no rebound or guarding Skin. No rashes Musculoskeletal: no joint deformities     Data Reviewed: I have personally reviewed following labs and imaging studies  CBC: Recent Labs  Lab 12/08/19 2052  WBC 5.5  HGB 10.6*  HCT 34.9*  MCV 86.2  PLT [id number]   Basic Metabolic Panel: Recent Labs  Lab 12/08/19 2052  NA 142  K 3.7  CL 107  CO2 24  GLUCOSE 204*  BUN 10  CREATININE 0.72  CALCIUM 9.6   GFR: Estimated Creatinine Clearance: 132 mL/min (by C-G formula based on SCr of 0.72 mg/dL). Liver Function Tests: No results for input(s): AST, ALT, ALKPHOS, BILITOT, PROT, ALBUMIN in the last 168 hours. No results for input(s): LIPASE, AMYLASE in the last 168 hours. No results for input(s): AMMONIA in the last 168 hours. Coagulation Profile: No results for input(s): INR, PROTIME in the last 168 hours. Cardiac Enzymes: No results for input(s): CKTOTAL, CKMB, CKMBINDEX, TROPONINI in the last 168 hours. BNP (last 3 results) No results for input(s): PROBNP in the last 8760 hours. HbA1C: No results for input(s): HGBA1C in the last 72 hours. CBG: Recent Labs  Lab 12/09/19 0745 12/09/19 1202  GLUCAP 151* 190*   Lipid Profile: No results for input(s): CHOL, HDL, LDLCALC, TRIG, CHOLHDL, LDLDIRECT in the last 72 hours. Thyroid  Function Tests: No results for input(s): TSH, T4TOTAL, FREET4, T3FREE, THYROIDAB in the last 72 hours. Anemia Panel: No results for input(s): VITAMINB12, FOLATE, FERRITIN, TIBC, IRON, RETICCTPCT in the last 72 hours.    Radiology Studies: I have reviewed all of the imaging during this hospital visit personally     Scheduled Meds: . amLODipine  10 mg Oral Daily  . atorvastatin  40 mg Oral q1800  . carvedilol  25 mg Oral BID WC  . dextromethorphan-guaiFENesin  1 tablet Oral BID  . escitalopram  10 mg Oral Daily  . furosemide  60 mg Intravenous BID  . insulin aspart  0-5 Units Subcutaneous QHS  . insulin aspart  0-9 Units Subcutaneous TID WC  . losartan  100 mg Oral Daily  . oxybutynin  15 mg Oral QHS  . risperidone  4 mg Oral Daily  . sodium chloride flush  3 mL Intravenous Once  . sodium chloride flush  3 mL Intravenous Q12H   Continuous Infusions: . sodium chloride       LOS: 0 days        [name] Gerome Apley, MD

## 2019-12-09 NOTE — ED Notes (Signed)
bfast ordered by Brooke 

## 2019-12-10 DIAGNOSIS — E669 Obesity, unspecified: Secondary | ICD-10-CM

## 2019-12-10 DIAGNOSIS — Z6841 Body Mass Index (BMI) 40.0 and over, adult: Secondary | ICD-10-CM

## 2019-12-10 LAB — BASIC METABOLIC PANEL
Anion gap: 10 (ref 5–15)
BUN: 14 mg/dL (ref 6–20)
CO2: 26 mmol/L (ref 22–32)
Calcium: 8.8 mg/dL — ABNORMAL LOW (ref 8.9–10.3)
Chloride: 101 mmol/L (ref 98–111)
Creatinine, Ser: 0.84 mg/dL (ref 0.44–1.00)
GFR calc Af Amer: >60 mL/min (ref >60)
GFR calc non Af Amer: >60 mL/min (ref >60)
Glucose, Bld: 221 mg/dL — ABNORMAL HIGH (ref 70–99)
Potassium: 3.4 mmol/L — ABNORMAL LOW (ref 3.5–5.1)
Sodium: 137 mmol/L (ref 135–145)

## 2019-12-10 LAB — GLUCOSE, CAPILLARY
Glucose-Capillary: 199 mg/dL — ABNORMAL HIGH (ref 70–99)
Glucose-Capillary: 207 mg/dL — ABNORMAL HIGH (ref 70–99)
Glucose-Capillary: 252 mg/dL — ABNORMAL HIGH (ref 70–99)
Glucose-Capillary: 241 mg/dL — ABNORMAL HIGH (ref 70–99)

## 2019-12-10 MED ORDER — INSULIN ASPART 100 UNIT/ML ~~LOC~~ SOLN
0.0000 [IU] | Freq: Three times a day (TID) | SUBCUTANEOUS | Status: DC
  Administered 2019-12-10: 8 [IU] via SUBCUTANEOUS
  Administered 2019-12-11: 3 [IU] via SUBCUTANEOUS
  Administered 2019-12-11 (×2): 5 [IU] via SUBCUTANEOUS
  Administered 2019-12-12 (×3): 3 [IU] via SUBCUTANEOUS
  Administered 2019-12-13: 5 [IU] via SUBCUTANEOUS
  Administered 2019-12-13: 2 [IU] via SUBCUTANEOUS
  Administered 2019-12-13 – 2019-12-14 (×2): 8 [IU] via SUBCUTANEOUS
  Administered 2019-12-14 (×2): 5 [IU] via SUBCUTANEOUS
  Administered 2019-12-15 (×2): 3 [IU] via SUBCUTANEOUS
  Administered 2019-12-15: 11 [IU] via SUBCUTANEOUS
  Administered 2019-12-16: 3 [IU] via SUBCUTANEOUS
  Administered 2019-12-16: 5 [IU] via SUBCUTANEOUS
  Administered 2019-12-16 – 2019-12-17 (×3): 3 [IU] via SUBCUTANEOUS

## 2019-12-10 MED ORDER — POTASSIUM CHLORIDE CRYS ER 20 MEQ PO TBCR
40.0000 meq | EXTENDED_RELEASE_TABLET | ORAL | Status: AC
  Administered 2019-12-10 (×2): 40 meq via ORAL
  Filled 2019-12-10 (×2): qty 2

## 2019-12-10 MED ORDER — INSULIN DETEMIR 100 UNIT/ML ~~LOC~~ SOLN
5.0000 [IU] | Freq: Every day | SUBCUTANEOUS | Status: DC
  Administered 2019-12-10: 5 [IU] via SUBCUTANEOUS
  Filled 2019-12-10 (×2): qty 0.05

## 2019-12-10 NOTE — Evaluation (Signed)
Occupational Therapy Evaluation Patient Details Name: [name] MRN: [id number] DOB: August 07, 1966 Today's Date: 12/10/2019    History of Present Illness 54 y.o. female with medical history significant of chronic diastolic congestive heart failure, hypertension, persistent atrial fibrillation, pulmonary hypertension, SVT, type 2 diabetes, morbid obesity (BMI 52), OSA, bipolar disorder presented to ED with complaints of chest pain, shortness of breath, and lower extremity edema.   Clinical Impression   This 54 y/o female presents with the above. PTA pt reports independence with ADL and functional mobility. Pt currently presenting with decreased endurance and respiratory status with activity. Pt currently requiring minguard assist for room level mobility, requiring cues for safety and for self-pacing as pt very quick to move at times. Pt currently requiring at least minA for LB ADL. Initiated education of use of AE for LB ADL as well as utilizing energy conservation strategies during functional tasks, pt will benefit from further review/education. Pt on RA during session with SpO2 >/=88% with activity, DOE 2/4. She will benefit from continued acute OT services to progress her towards her PLOF. Will follow.     Follow Up Recommendations  No OT follow up;Supervision - Intermittent    Equipment Recommendations  Tub/shower seat           Precautions / Restrictions Precautions Precautions: Fall Precaution Comments: watch SpO2 Restrictions Weight Bearing Restrictions: No      Mobility Bed Mobility               General bed mobility comments: sitting EOB upon arrival  Transfers Overall transfer level: Needs assistance Equipment used: None Transfers: Sit to/from Stand Sit to Stand: Supervision         General transfer comment: for safety    Balance Overall balance assessment: Mild deficits observed, not formally tested                                         ADL either performed or assessed with clinical judgement   ADL Overall ADL's : Needs assistance/impaired Eating/Feeding: Independent;Sitting   Grooming: Set up;Min guard;Sitting   Upper Body Bathing: Supervision/ safety;Set up;Sitting   Lower Body Bathing: Minimal assistance;Sit to/from stand   Upper Body Dressing : Set up;Supervision/safety;Sitting   Lower Body Dressing: Minimal assistance;Sit to/from stand;With adaptive equipment Lower Body Dressing Details (indicate cue type and reason): educated in use of reacher and sock aide for increasing independence with LB ADL - pt return demosntrating with mod cues. After practice pt declined need for sock aide  Toilet Transfer: Min guard;Ambulation Toilet Transfer Details (indicate cue type and reason): simulated via transfer to/from EOB  Toileting- Clothing Manipulation and Hygiene: Min guard;Sitting/lateral lean;Sit to/from stand       Functional mobility during ADLs: Min guard General ADL Comments: Pt with decreased endurance and respiratory status, 2/4 DOE with short distance mobility in room.  pt mobilizing rather quickly and required cues for safety and to pace herself during completion                          Pertinent Vitals/Pain Pain Assessment: No/denies pain     Hand Dominance     Extremity/Trunk Assessment Upper Extremity Assessment Upper Extremity Assessment: Overall WFL for tasks assessed   Lower Extremity Assessment Lower Extremity Assessment: Defer to PT evaluation   Cervical / Trunk Assessment Cervical / Trunk Assessment: Other exceptions Cervical /  Trunk Exceptions: morbid obesity   Communication Communication Communication: No difficulties   Cognition Arousal/Alertness: Awake/alert Behavior During Therapy: WFL for tasks assessed/performed Overall Cognitive Status: No family/caregiver present to determine baseline cognitive functioning                                 General  Comments: requires cues for carryover of some instruction, cues for safety with mobility, suspect pt with decreased understanding of current deficits    General Comments       Exercises     Shoulder Instructions      Home Living Family/patient expects to be discharged to:: Private residence Living Arrangements: Parent;Other relatives(brother) Available Help at Discharge: Family;Available 24 hours/day Type of Home: House Home Access: Level entry     Home Layout: Two level Alternate Level Stairs-Number of Steps: flight Alternate Level Stairs-Rails: Left Bathroom Shower/Tub: Occupational psychologist: Standard     Home Equipment: None          Prior Functioning/Environment Level of Independence: Independent                 OT Problem List: Decreased activity tolerance;Obesity;Cardiopulmonary status limiting activity;Decreased knowledge of precautions;Decreased knowledge of use of DME or AE;Impaired balance (sitting and/or standing)      OT Treatment/Interventions: Self-care/ADL training;Therapeutic exercise;Energy conservation;DME and/or AE instruction;Therapeutic activities;Cognitive remediation/compensation;Patient/family education;Balance training    OT Goals(Current goals can be found in the care plan section) Acute Rehab OT Goals Patient Stated Goal: To return to independence and go home OT Goal Formulation: With patient Time For Goal Achievement: 12/24/19 Potential to Achieve Goals: Good  OT Frequency: Min 2X/week   Barriers to D/C:            Co-evaluation              AM-PAC OT "6 Clicks" Daily Activity     Outcome Measure Help from another person eating meals?: None Help from another person taking care of personal grooming?: A Little Help from another person toileting, which includes using toliet, bedpan, or urinal?: A Little Help from another person bathing (including washing, rinsing, drying)?: A Lot Help from another person to put  on and taking off regular upper body clothing?: None Help from another person to put on and taking off regular lower body clothing?: A Lot 6 Click Score: 18   End of Session Nurse Communication: Mobility status  Activity Tolerance: Patient tolerated treatment well;Patient limited by fatigue Patient left: with call bell/phone within reach;Other (comment)(seated EOB)  OT Visit Diagnosis: Other abnormalities of gait and mobility (R26.89)                Time: [id number] OT Time Calculation (min): 18 min Charges:  OT General Charges $OT Visit: 1 Visit OT Evaluation $OT Eval Moderate Complexity: 1 Mod  Lou Cal, OT Acute Rehabilitation Services Pager [phone] Office [phone]   Raymondo Band 12/10/2019, 4:24 PM

## 2019-12-10 NOTE — Plan of Care (Signed)
  Problem: Nutrition: Goal: Adequate nutrition will be maintained Outcome: Completed/Met   Problem: Pain Managment: Goal: General experience of comfort will improve Outcome: Completed/Met   Problem: Safety: Goal: Ability to remain free from injury will improve Outcome: Completed/Met   Problem: Skin Integrity: Goal: Risk for impaired skin integrity will decrease Outcome: Completed/Met   

## 2019-12-10 NOTE — Evaluation (Signed)
Physical Therapy Evaluation Patient Details Name: [name] MRN: [id number] DOB: 1966/09/20 Today's Date: 12/10/2019   History of Present Illness  54 y.o. female with medical history significant of chronic diastolic congestive heart failure, hypertension, persistent atrial fibrillation, pulmonary hypertension, SVT, type 2 diabetes, morbid obesity (BMI 52), OSA, bipolar disorder presented to ED with complaints of chest pain, shortness of breath, and lower extremity edema.  Clinical Impression  Pt presents to PT with deficits in cardiopulmomary function, gait, and balance. Pt is greatly limited by increased work of breathing and SOB, desaturating to 84% on RA during ambulation. Pt currently does not require any physical assistance to mobilize. Pt will benefit from aggressive mobilization to improve activity tolerance and a return to independent mobility. Pt should ambulate out of the room at least 3 times per day. Pt will benefit from continued assessment of supplemental oxygen needs as she may require some at time of discharge.    Follow Up Recommendations No PT follow up(pt declining possible PT follow-up)    Equipment Recommendations  None recommended by PT    Recommendations for Other Services       Precautions / Restrictions Precautions Precautions: Fall Precaution Comments: watch SpO2 Restrictions Weight Bearing Restrictions: No      Mobility  Bed Mobility Overal bed mobility: (pt received and left sitting at edge of bed)                Transfers Overall transfer level: Needs assistance Equipment used: None Transfers: Sit to/from Stand Sit to Stand: Supervision            Ambulation/Gait Ambulation/Gait assistance: Independent Gait Distance (Feet): 120 Feet Assistive device: None Gait Pattern/deviations: Step-through pattern;Wide base of support Gait velocity: functional Gait velocity interpretation: >2.62 ft/sec, indicative of community  ambulatory General Gait Details: pt requires 2 standing rest breaks due to fatigue and SOB  Stairs            Wheelchair Mobility    Modified Rankin (Stroke Patients Only)       Balance Overall balance assessment: Mild deficits observed, not formally tested                                           Pertinent Vitals/Pain Pain Assessment: No/denies pain    Home Living Family/patient expects to be discharged to:: Private residence Living Arrangements: Parent;Other relatives(brother) Available Help at Discharge: Family;Available 24 hours/day Type of Home: House Home Access: Level entry     Home Layout: Two level Home Equipment: None      Prior Function Level of Independence: Independent         Comments: mother reports pt is lazy and does not like to mobilize at home     Hand Dominance        Extremity/Trunk Assessment   Upper Extremity Assessment Upper Extremity Assessment: Overall WFL for tasks assessed    Lower Extremity Assessment Lower Extremity Assessment: Overall WFL for tasks assessed    Cervical / Trunk Assessment Cervical / Trunk Assessment: Other exceptions Cervical / Trunk Exceptions: morbid obesity  Communication   Communication: No difficulties  Cognition Arousal/Alertness: Awake/alert Behavior During Therapy: WFL for tasks assessed/performed Overall Cognitive Status: Within Functional Limits for tasks assessed  General Comments General comments (skin integrity, edema, etc.): Pt on RA at time of PT arrival, saturating at 94% at rest. Pt desats to 84% on RA during ambulation with increased work of breathing and pt reports of SOB. Pt returned to 2L [place] upon completion of ambulation with quick recovery to 96% SpO2    Exercises     Assessment/Plan    PT Assessment Patient needs continued PT services  PT Problem List Decreased balance;Decreased  mobility;Cardiopulmonary status limiting activity       PT Treatment Interventions Gait training;Stair training;Functional mobility training;Balance training;Neuromuscular re-education;Patient/family education    PT Goals (Current goals can be found in the Care Plan section)  Acute Rehab PT Goals Patient Stated Goal: To return to independence and go home PT Goal Formulation: With patient Time For Goal Achievement: 12/24/19 Potential to Achieve Goals: Good    Frequency Min 3X/week   Barriers to discharge        Co-evaluation               AM-PAC PT "6 Clicks" Mobility  Outcome Measure Help needed turning from your back to your side while in a flat bed without using bedrails?: A Little Help needed moving from lying on your back to sitting on the side of a flat bed without using bedrails?: A Little Help needed moving to and from a bed to a chair (including a wheelchair)?: None Help needed standing up from a chair using your arms (e.g., wheelchair or bedside chair)?: None Help needed to walk in hospital room?: None Help needed climbing 3-5 steps with a railing? : A Little 6 Click Score: 21    End of Session Equipment Utilized During Treatment: Oxygen Activity Tolerance: Patient limited by fatigue Patient left: in bed;with call bell/phone within reach;with family/visitor present Nurse Communication: Mobility status PT Visit Diagnosis: Other (comment)(cardiopulmonary deficits)    Time: [id number] PT Time Calculation (min) (ACUTE ONLY): 16 min   Charges:   PT Evaluation $PT Eval Moderate Complexity: [place], PT, DPT Acute Rehabilitation Pager: [phone]   Zenaida Niece 12/10/2019, 11:32 AM

## 2019-12-10 NOTE — Progress Notes (Signed)
PROGRESS NOTE    [name]  [id number] DOB: Jun 24, 1966 DOA: 12/08/2019 PCP: [name], NP    Brief Narrative:  Patient was admitted to the hospital with working diagnosis of acute decompensation of diastolic heart failure, complicated with cardiogenic pulmonary edema  54 year old female who presented with chest pain, dyspnea lower extremity edema.  She does have significant past medical history for chronic diastolic heart failure, hypertension, atrial fibrillation, pulmonary hypertension, type 2 diabetes mellitus, obstructive sleep apnea and morbid obesity.    Patient reported 1 week of progressive and worsening dyspnea, associated with productive cough, orthopnea, PND, increased abdominal girth and lower extremity edema.  She gained 15 pounds in the last 4 weeks.  24 hours prior to hospitalization she developed substernal left-sided chest pressure, which was persistent.  Her initial physical examination blood pressure 149/86 heart rate 95, respiratory rate 23, oxygen saturation 95% her lungs had rales bilaterally, positive JVD, heart S1-S2, present rhythmic, abdomen distended, positive lower extremity edema.  Sodium 142, potassium 3.7, chloride 107, bicarb 24, glucose 204, BUN 10, creatinine 0.72, BNP 391, troponin 94, white count 5.5, hemoglobin 10.6, hematocrit 34.9, platelets 231.  SARS COVID-19 negative.  Chest radiograph with cardiomegaly, bilateral interstitial infiltrates with vascular congestion.  EKG had 111 bpm, normal axis, no QRS prolongation, atrial fibrillation rhythm, poor R wave progression, no ST segment or T wave changes, low voltage.   Patient was admitted to the telemetry ward, and started with aggressive diuresis with intravenous furosemide.  Patient is responding well to diuresis with furosemide.    Assessment & Plan:   Principal Problem:   Acute exacerbation of CHF (congestive heart failure) (HCC) Active Problems:   HTN (hypertension)   Type 2  diabetes mellitus (HCC)   Chest pain   A-fib ([place])   1. Acute on chronic diastolic heart failure decompensation in the setting of suspected pulmonary hypertension. Blood pressure continue to be stable 128/94 mmHg, urine output over last 24 H is 2,050 ml. With improvement of her symptoms, but patient continue clinically hypervolemic. Echocardiogram with preserved LV systolic function EF 65 to 70%. Elevated pulmonary systolic pressures, 46 mmHg.   Will continue diuresis with IV furosemide to target negative fluid balance. Continue telemetry monitoring. Heart failure management with carvedilol, holding ace inh or abr due to risk of hypotension. Consulted nutrition for heart failure teaching.   Continue physical and occupation therapy assessments.   2. HTN. Blood pressure control with amlodipine.   3. Atrial fibrillation chronic. On carvedilol for rate control, and anticoagulation with rivaroxaban.   4. Uncontrolled T2DM (Hgb A1c 7,6)/ dyslipidemia. On insulin sliding scale for glucose cover and monitoring. Fasting glucose this am 221. Patient had 7 units of sliding scale yesterday. Will add basal insulin of 5 units and will change sliding scale to moderate.   On atorvastatin.   5. Obesity class 3 with BMI >40. Continue to encourage aggressive lifestyle modification, follow with nutrition recommendations.   6. Depression. On risperidone and escitalopram.   DVT prophylaxis: rivaroxaban   Code Status:  full Family Communication: I spoke with patient's mother at the bedside, we talked in detail about patient's condition, plan of care and prognosis and all questions were addressed.  Disposition Plan/ discharge barriers: patient from home barrier for dc decompensated heart failure, requiring IV diuresis and close inpatient monitoring.    Subjective: Patient is feeling better but not yet back to baseline, dyspnea has improved along with lower extremity edema, no chest pain, no nausea or  vomiting,  out of bed with physical therapy.   Objective: Vitals:   12/10/19 0000 12/10/19 0434 12/10/19 0921 12/10/19 1139  BP: 140/78 118/77 115/79 (!) 128/94  Pulse: 88   93  Resp:  20 18 20   Temp:   98.6 F (37 C) 97.8 F (36.6 C)  TempSrc:   Oral Oral  SpO2: 98%   99%  Weight:  (!) 159 kg    Height:        Intake/Output Summary (Last 24 hours) at 12/10/2019 1213 Last data filed at 12/10/2019 1125 Gross per 24 hour  Intake 1080 ml  Output 2050 ml  Net -970 ml   Filed Weights   12/09/19 0105 12/09/19 1158 12/10/19 0434  Weight: (!) 151 kg (!) 158.8 kg (!) 159 kg    Examination:   General: Not in pain or dyspnea. Deconditioned  Neurology: Awake and alert, non focal  E ENT: mild pallor, no icterus, oral mucosa moist Cardiovascular: No JVD. S1-S2 present, rhythmic, no gallops, rubs, or murmurs. No lower extremity edema +++ pitting.  Pulmonary: positive breath sounds bilaterally, adequate air movement, no wheezing, rhonchi or rales. Gastrointestinal. Abdomen with no organomegaly, non tender, no rebound or guarding Skin. No rashes Musculoskeletal: no joint deformities     Data Reviewed: I have personally reviewed following labs and imaging studies  CBC: Recent Labs  Lab 12/08/19 2052  WBC 5.5  HGB 10.6*  HCT 34.9*  MCV 86.2  PLT [id number]   Basic Metabolic Panel: Recent Labs  Lab 12/08/19 2052 12/09/19 1407 12/10/19 0532  NA 142 139 137  K 3.7 3.7 3.4*  CL 107 101 101  CO2 24 29 26   GLUCOSE 204* 168* 221*  BUN 10 12 14   CREATININE 0.72 0.79 0.84  CALCIUM 9.6 9.3 8.8*   GFR: Estimated Creatinine Clearance: 125.8 mL/min (by C-G formula based on SCr of 0.84 mg/dL). Liver Function Tests: No results for input(s): AST, ALT, ALKPHOS, BILITOT, PROT, ALBUMIN in the last 168 hours. No results for input(s): LIPASE, AMYLASE in the last 168 hours. No results for input(s): AMMONIA in the last 168 hours. Coagulation Profile: No results for input(s): INR, PROTIME in  the last 168 hours. Cardiac Enzymes: No results for input(s): CKTOTAL, CKMB, CKMBINDEX, TROPONINI in the last 168 hours. BNP (last 3 results) No results for input(s): PROBNP in the last 8760 hours. HbA1C: No results for input(s): HGBA1C in the last 72 hours. CBG: Recent Labs  Lab 12/09/19 1202 12/09/19 1625 12/09/19 2106 12/10/19 0609 12/10/19 1111  GLUCAP 190* 237* 190* 199* 207*   Lipid Profile: No results for input(s): CHOL, HDL, LDLCALC, TRIG, CHOLHDL, LDLDIRECT in the last 72 hours. Thyroid Function Tests: No results for input(s): TSH, T4TOTAL, FREET4, T3FREE, THYROIDAB in the last 72 hours. Anemia Panel: No results for input(s): VITAMINB12, FOLATE, FERRITIN, TIBC, IRON, RETICCTPCT in the last 72 hours.    Radiology Studies: I have reviewed all of the imaging during this hospital visit personally     Scheduled Meds: . amLODipine  10 mg Oral Daily  . atorvastatin  40 mg Oral q1800  . carvedilol  25 mg Oral BID WC  . dextromethorphan-guaiFENesin  1 tablet Oral BID  . escitalopram  10 mg Oral Daily  . furosemide  60 mg Intravenous BID  . insulin aspart  0-5 Units Subcutaneous QHS  . insulin aspart  0-9 Units Subcutaneous TID WC  . oxybutynin  15 mg Oral QHS  . risperidone  4 mg Oral Daily  . sodium chloride  flush  3 mL Intravenous Once  . sodium chloride flush  3 mL Intravenous Q12H   Continuous Infusions: . sodium chloride       LOS: 1 day        Jenise Iannelli Gerome Apley, MD

## 2019-12-10 NOTE — Plan of Care (Signed)
  Problem: Education: Goal: Ability to demonstrate management of disease process will improve Outcome: Progressing Goal: Ability to verbalize understanding of medication therapies will improve Outcome: Progressing Goal: Individualized Educational Video(s) Outcome: Progressing   Problem: Activity: Goal: Capacity to carry out activities will improve Outcome: Progressing   Problem: Cardiac: Goal: Ability to achieve and maintain adequate cardiopulmonary perfusion will improve Outcome: Progressing   Problem: Education: Goal: Knowledge of General Education information will improve Description: Including pain rating scale, medication(s)/side effects and non-pharmacologic comfort measures Outcome: Progressing   Problem: Health Behavior/Discharge Planning: Goal: Ability to manage health-related needs will improve Outcome: Progressing   Problem: Clinical Measurements: Goal: Ability to maintain clinical measurements within normal limits will improve Outcome: Progressing Goal: Will remain free from infection Outcome: Progressing Goal: Diagnostic test results will improve Outcome: Progressing Goal: Respiratory complications will improve Outcome: Progressing Goal: Cardiovascular complication will be avoided Outcome: Progressing   Problem: Activity: Goal: Risk for activity intolerance will decrease Outcome: Progressing   Problem: Coping: Goal: Level of anxiety will decrease Outcome: Progressing   Problem: Elimination: Goal: Will not experience complications related to bowel motility Outcome: Progressing Goal: Will not experience complications related to urinary retention Outcome: Progressing

## 2019-12-11 DIAGNOSIS — I509 Heart failure, unspecified: Secondary | ICD-10-CM | POA: Diagnosis not present

## 2019-12-11 DIAGNOSIS — G4733 Obstructive sleep apnea (adult) (pediatric): Secondary | ICD-10-CM | POA: Diagnosis not present

## 2019-12-11 DIAGNOSIS — E669 Obesity, unspecified: Secondary | ICD-10-CM

## 2019-12-11 LAB — BASIC METABOLIC PANEL
Anion gap: 12 (ref 5–15)
BUN: 14 mg/dL (ref 6–20)
CO2: 25 mmol/L (ref 22–32)
Calcium: 8.7 mg/dL — ABNORMAL LOW (ref 8.9–10.3)
Chloride: 100 mmol/L (ref 98–111)
Creatinine, Ser: 0.76 mg/dL (ref 0.44–1.00)
GFR calc Af Amer: >60 mL/min (ref >60)
GFR calc non Af Amer: >60 mL/min (ref >60)
Glucose, Bld: 231 mg/dL — ABNORMAL HIGH (ref 70–99)
Potassium: 4.1 mmol/L (ref 3.5–5.1)
Sodium: 137 mmol/L (ref 135–145)

## 2019-12-11 LAB — GLUCOSE, CAPILLARY
Glucose-Capillary: 198 mg/dL — ABNORMAL HIGH (ref 70–99)
Glucose-Capillary: 241 mg/dL — ABNORMAL HIGH (ref 70–99)
Glucose-Capillary: 216 mg/dL — ABNORMAL HIGH (ref 70–99)
Glucose-Capillary: 332 mg/dL — ABNORMAL HIGH (ref 70–99)

## 2019-12-11 LAB — MAGNESIUM: Magnesium: 1.7 mg/dL (ref 1.7–2.4)

## 2019-12-11 MED ORDER — IBUPROFEN 200 MG PO TABS
400.0000 mg | ORAL_TABLET | Freq: Four times a day (QID) | ORAL | Status: DC | PRN
  Administered 2019-12-13: 400 mg via ORAL
  Filled 2019-12-11: qty 2

## 2019-12-11 MED ORDER — TRAMADOL HCL 50 MG PO TABS: 50.0000 mg | ORAL_TABLET | Freq: Four times a day (QID) | ORAL | Status: DC | PRN

## 2019-12-11 MED ORDER — INSULIN ASPART 100 UNIT/ML ~~LOC~~ SOLN
4.0000 [IU] | Freq: Once | SUBCUTANEOUS | Status: AC
  Administered 2019-12-11: 4 [IU] via SUBCUTANEOUS

## 2019-12-11 MED ORDER — INSULIN DETEMIR 100 UNIT/ML ~~LOC~~ SOLN
14.0000 [IU] | Freq: Every day | SUBCUTANEOUS | Status: DC
  Administered 2019-12-12 – 2019-12-18 (×7): 14 [IU] via SUBCUTANEOUS
  Filled 2019-12-11 (×7): qty 0.14

## 2019-12-11 MED ORDER — INSULIN DETEMIR 100 UNIT/ML ~~LOC~~ SOLN
5.0000 [IU] | SUBCUTANEOUS | Status: AC
  Administered 2019-12-11: 5 [IU] via SUBCUTANEOUS
  Filled 2019-12-11: qty 0.05

## 2019-12-11 MED ORDER — INSULIN DETEMIR 100 UNIT/ML ~~LOC~~ SOLN
10.0000 [IU] | Freq: Every day | SUBCUTANEOUS | Status: DC
  Administered 2019-12-11: 10 [IU] via SUBCUTANEOUS
  Filled 2019-12-11: qty 0.1

## 2019-12-11 NOTE — TOC Transition Note (Signed)
Transition of Care [facility]) - CM/SW Discharge Note   Patient Details  Name: [name] MRN: [id number] Date of Birth: [birth year]  [facility] CM/SW Contact:  Zenon Mayo, RN Phone Number: 12/11/2019, 4:08 PM   Clinical Narrative:    NCM spoke with patient, she lives at home with her mom. NCM asked if she would like to be set up with a [facility] for CHF management, she states no.  She states she would like a shower stool.  NCM made referral to [facility] with [employer].  This will be brought up to patient's room prior to discharge.    Final next level of care: Home/Self Care Barriers to Discharge: Continued Medical Work up   Patient Goals and CMS Choice Patient states their goals for this hospitalization and ongoing recovery are:: get better   Choice offered to / list presented to : NA  Discharge Placement                       Discharge Plan and Services                DME Arranged: Shower stool DME Agency: [employer] Date DME Agency Contacted: 12/11/19 Time DME Agency Contacted: [id number] Representative spoke with at DME Agency: [place] ([place]) Interventions     Readmission Risk Interventions No flowsheet data found.

## 2019-12-11 NOTE — Progress Notes (Signed)
Occupational Therapy Treatment Patient Details Name: [name] MRN: [id number] DOB: 1965/12/29 Today's Date: 12/11/2019    History of present illness 54 y.o. female with medical history significant of chronic diastolic congestive heart failure, hypertension, persistent atrial fibrillation, pulmonary hypertension, SVT, type 2 diabetes, morbid obesity (BMI 52), OSA, bipolar disorder presented to ED with complaints of chest pain, shortness of breath, and lower extremity edema.   OT comments  Upon arrival patient seated at edge of bed, agreeable to OT. When asked what her main concerns are regarding self care patient expresses concerns with incontinence. OT educate patient on multiple interventions such as using a bedside commode especially at night for urgent needs, use of sanitary pads, trial of toilet schedule (using bathroom every hour while awake) and implementing kegel exercises. Patient verbalize understanding. Patient also express desire for LB dressing AE, provide patient with kit and have patient return demo use of reacher + sock aid at supervision level. Will continue to follow.    Follow Up Recommendations  No OT follow up;Supervision - Intermittent    Equipment Recommendations  Tub/shower seat       Precautions / Restrictions Precautions Precautions: Fall Precaution Comments: watch SpO2       Mobility Bed Mobility               General bed mobility comments: sitting EOB upon arrival  Transfers Overall transfer level: Needs assistance Equipment used: None Transfers: Sit to/from Stand Sit to Stand: Supervision         General transfer comment: patient decline getting up from EOB at this time    Balance Overall balance assessment: Needs assistance Sitting-balance support: No upper extremity supported;Feet supported Sitting balance-Leahy Scale: Normal     Standing balance support: No upper extremity supported;During functional activity Standing  balance-Leahy Scale: Good                             ADL either performed or assessed with clinical judgement   ADL Overall ADL's : Needs assistance/impaired                     Lower Body Dressing: Sitting/lateral leans;Supervision/safety Lower Body Dressing Details (indicate cue type and reason): patient agreeable to AE kit for LB dressing. Patient return demo use of reacher and sock aid after visual and verbal instruction, patient able to doff/don socks with supervision                               Cognition Arousal/Alertness: Awake/alert Behavior During Therapy: [facility] for tasks assessed/performed Overall Cognitive Status: No family/caregiver present to determine baseline cognitive functioning                                 General Comments: patient follow all directions appropriately              General Comments Pt on RA at arrival with sats 95%; pt ambulated 200' with sats maintaining >90%, however, she had increased SHOB for last 50' and sats down to 86%.  Took 30 sec rest and cues pursed lip breathing to recover to 90% - RN aware.    Pertinent Vitals/ Pain       Pain Assessment: Faces Faces Pain Scale: No hurt         Frequency  Min 2X/week  Progress Toward Goals  OT Goals(current goals can now be found in the care plan section)  Progress towards OT goals: Progressing toward goals  Acute Rehab OT Goals Patient Stated Goal: To return to independence and go home OT Goal Formulation: With patient Time For Goal Achievement: 12/24/19 Potential to Achieve Goals: Good ADL Goals Pt Will Perform Grooming: with modified independence;standing Pt Will Perform Lower Body Bathing: with modified independence;sit to/from stand;sitting/lateral leans;with adaptive equipment Pt Will Perform Lower Body Dressing: with modified independence;sit to/from stand;sitting/lateral leans;with adaptive equipment Pt Will Transfer to  Toilet: with modified independence;ambulating Pt Will Perform Toileting - Clothing Manipulation and hygiene: with modified independence;sit to/from stand Additional ADL Goal #1: Pt will independently verbalize/demosntrate at least 3 energy conservation strategies to use during ADL task.  Plan Discharge plan remains appropriate       AM-PAC OT "6 Clicks" Daily Activity     Outcome Measure   Help from another person eating meals?: None Help from another person taking care of personal grooming?: A Little Help from another person toileting, which includes using toliet, bedpan, or urinal?: A Little Help from another person bathing (including washing, rinsing, drying)?: A Lot Help from another person to put on and taking off regular upper body clothing?: None Help from another person to put on and taking off regular lower body clothing?: A Little 6 Click Score: 19    End of Session Equipment Utilized During Treatment: Other (comment)(AE kit)  OT Visit Diagnosis: Other abnormalities of gait and mobility (R26.89)   Activity Tolerance Patient tolerated treatment well   Patient Left in bed;with call bell/phone within reach           Time: 0957-1015 OT Time Calculation (min): 18 min  Charges: OT General Charges $OT Visit: 1 Visit OT Treatments $Self Care/Home Management : 8-22 mins  [facility] OT office: [place] 12/11/2019, 12:07 PM

## 2019-12-11 NOTE — Progress Notes (Signed)
Inpatient Diabetes Program Recommendations  AACE/ADA: New Consensus Statement on Inpatient Glycemic Control   Target Ranges:  Prepandial:   less than 140 mg/dL      Peak postprandial:   less than 180 mg/dL (1-2 hours)      Critically ill patients:  140 - 180 mg/dL   Results for EULALIA, LIRIANO (MRN [id number]) as of 12/11/2019 12:36  Ref. Range 12/10/2019 06:09 12/10/2019 11:11 12/10/2019 16:16 12/10/2019 21:14 12/11/2019 06:42 12/11/2019 11:52  Glucose-Capillary Latest Ref Range: 70 - 99 mg/dL 199 (H) 207 (H) 252 (H) 241 (H) 198 (H) 241 (H)   Review of Glycemic Control  Diabetes history: DM2 Outpatient Diabetes medications: Glipizide XL 10 mg daily, Metformin 1000 mg BID Current orders for Inpatient glycemic control: Levemir 10 units daily, NOvolog 0-15 units TID with meals  Inpatient Diabetes Program Recommendations:    Insulin-Correction: Please consider ordering Novolog 0-5 units QHS.  Insulin-Meal Coverage: Please consider ordering Novolog 3 units TID with meals for meal coverage if patient eats at least 50% of meals.  Thanks, Barnie Alderman, RN, MSN, CDE Diabetes Coordinator Inpatient Diabetes Program [phone] (Team Pager from 8am to 5pm)

## 2019-12-11 NOTE — Progress Notes (Signed)
PT states she doesn't feel well. CBG 332. States she has some chest discomfort but it is better than when she was admitted. Also has increased work of breathing. O2 sats 97 on RA. Provider notified. Additional CBG coverage ordered and EKG obtained.  Will continue to monitor.

## 2019-12-11 NOTE — Progress Notes (Signed)
PROGRESS NOTE    [place]  [id number] DOB: Aug 09, 1966 DOA: 12/08/2019 PCP: [name], NP    Brief Narrative:  Patient was admitted to the hospital with working diagnosis of acute decompensation of diastolic heart failure, complicated with cardiogenic pulmonary edema  54 year old female who presented with chest pain, dyspnea lower extremity edema. She does have significant past medical history for chronic diastolic heart failure, hypertension, atrial fibrillation, pulmonary hypertension, type 2 diabetes mellitus, obstructive sleep apnea and morbid obesity.   Patient reported 1 week of progressive and worsening dyspnea, associated with productive cough, orthopnea, PND, increased abdominal girth and lower extremity edema. She gained 15 pounds in the last 4 weeks. 24 hours prior to hospitalization she developed substernal left-sided chest pressure, which was persistent. Her initial physical examination blood pressure 149/86 heart rate 95, respiratory rate23,oxygen saturation 95% her lungs had rales bilaterally, positive JVD, heart S1-S2, present rhythmic, abdomen distended, positive lower extremity edema. Sodium 142, potassium 3.7, chloride 107, bicarb 24, glucose 204, BUN 10, creatinine 0.72, BNP 391, troponin 94, white count 5.5, hemoglobin 10.6, hematocrit 34.9, platelets 231.SARS COVID-19 negative. Chest radiograph with cardiomegaly, bilateral interstitial infiltrates with vascular congestion. EKG had 111 bpm, normal axis, no QRS prolongation,atrial fibrillation rhythm, poor R wave progression, no ST segment or T wave changes, low voltage.  Patient was admitted to the telemetry ward, and started with aggressive diuresis with intravenous furosemide.  Patient is responding well to diuresis with furosemide.    Assessment & Plan:   Principal Problem:   Acute exacerbation of CHF (congestive heart failure) (HCC) Active Problems:   HTN (hypertension)   Type 2  diabetes mellitus (HCC)   Chest pain   A-fib (HCC)   Class 3 obesity   1. Acute on chronic diastolic heart failure decompensation in the setting of suspected pulmonary hypertension.  Echocardiogram with preserved LV systolic function EF 65 to 70%. Elevated pulmonary systolic pressures, 46 mmHg.   Her urine output over last 24 H is 1,950 ml. Continue with improvement of her symptoms, but persistent hypervolemia on examination.  Plan to continue diuresis with IV furosemide to target further negative fluid balance. Medical therapy with carvedilol, continue holding ace inh or abr due to risk of hypotension. Follow with nutrition recommendations.  Continue to encourage out of bed and ambulating.   2. HTN. BP 124/68 will continue with amlodipine for blood pressure control.   3. Atrial fibrillation chronic. Continue with carvedilol 25 mg bid for rate control, and rivaroxaban for anticoagulation.   4. Uncontrolled T2DM (Hgb A1c 7,6)/ dyslipidemia. Fating glucose is 231 this am, will increase basal insulin to 10 units and will continue with insulin sliding scale for glucose cover and monitoring.   Continue with atorvastatin.   5. Obesity class 3 with BMI >40. Encourage aggressive lifestyle modification, will follow with nutrition recommendations.   6. Depression. Continue with risperidone and escitalopram.  DVT prophylaxis:rivaroxaban Code Status:full Family Communication:I spoke with patient's mother at the bedside, we talked in detail about patient's condition, plan of care and prognosis and all questions were addressed.  Disposition Plan/ discharge barriers:patient from home barrier for dc decompensated heart failure, requiring IV diuresis and close inpatient monitoring.   Subjective: Patient is feeling better, her dyspnea is improving along with her mobility, but not yet back to baseline, no nausea or vomiting, no chest pain or palpitations.    Objective: Vitals:    12/11/19 0429 12/11/19 0500 12/11/19 0700 12/11/19 0744  BP: (!) 128/93  Pulse: 83 83 (!) 49   Resp: 18     Temp: 98.1 F (36.7 C)   97.7 F (36.5 C)  TempSrc: Oral   Oral  SpO2: 99% 94% 98%   Weight: (!) 160.9 kg     Height:        Intake/Output Summary (Last 24 hours) at 12/11/2019 0823 Last data filed at 12/11/2019 [id number] Gross per 24 hour  Intake 1062 ml  Output 1950 ml  Net -888 ml   Filed Weights   12/09/19 1158 12/10/19 0434 12/11/19 0429  Weight: (!) 158.8 kg (!) 159 kg (!) 160.9 kg    Examination:   General: deconditioned Neurology: Awake and alert, non focal  E ENT: mild pallor, no icterus, oral mucosa moist Cardiovascular: No JVD. S1-S2 present, rhythmic, no gallops, rubs, or murmurs. Positive lower extremity edema +++. Pulmonary:  Positive breath sounds bilaterally, adequate air movement, no wheezing, rhonchi or rales. Gastrointestinal. Abdomen protuberant, no organomegaly, non tender, no rebound or guarding Skin. No rashes Musculoskeletal: no joint deformities     Data Reviewed: I have personally reviewed following labs and imaging studies  CBC: Recent Labs  Lab 12/08/19 2052  WBC 5.5  HGB 10.6*  HCT 34.9*  MCV 86.2  PLT [id number]   Basic Metabolic Panel: Recent Labs  Lab 12/08/19 2052 12/09/19 1407 12/10/19 0532 12/11/19 0541  NA 142 139 137 137  K 3.7 3.7 3.4* 4.1  CL 107 101 101 100  CO2 24 29 26 25   GLUCOSE 204* 168* 221* 231*  BUN 10 12 14 14   CREATININE 0.72 0.79 0.84 0.76  CALCIUM 9.6 9.3 8.8* 8.7*  MG  --   --   --  1.7   GFR: Estimated Creatinine Clearance: 133 mL/min (by C-G formula based on SCr of 0.76 mg/dL). Liver Function Tests: No results for input(s): AST, ALT, ALKPHOS, BILITOT, PROT, ALBUMIN in the last 168 hours. No results for input(s): LIPASE, AMYLASE in the last 168 hours. No results for input(s): AMMONIA in the last 168 hours. Coagulation Profile: No results for input(s): INR, PROTIME in the last 168  hours. Cardiac Enzymes: No results for input(s): CKTOTAL, CKMB, CKMBINDEX, TROPONINI in the last 168 hours. BNP (last 3 results) No results for input(s): PROBNP in the last 8760 hours. HbA1C: No results for input(s): HGBA1C in the last 72 hours. CBG: Recent Labs  Lab 12/10/19 0609 12/10/19 1111 12/10/19 1616 12/10/19 2114 12/11/19 0642  GLUCAP 199* 207* 252* 241* 198*   Lipid Profile: No results for input(s): CHOL, HDL, LDLCALC, TRIG, CHOLHDL, LDLDIRECT in the last 72 hours. Thyroid Function Tests: No results for input(s): TSH, T4TOTAL, FREET4, T3FREE, THYROIDAB in the last 72 hours. Anemia Panel: No results for input(s): VITAMINB12, FOLATE, FERRITIN, TIBC, IRON, RETICCTPCT in the last 72 hours.    Radiology Studies: I have reviewed all of the imaging during this hospital visit personally     Scheduled Meds: . amLODipine  10 mg Oral Daily  . atorvastatin  40 mg Oral q1800  . carvedilol  25 mg Oral BID WC  . dextromethorphan-guaiFENesin  1 tablet Oral BID  . escitalopram  10 mg Oral Daily  . furosemide  60 mg Intravenous BID  . insulin aspart  0-15 Units Subcutaneous TID WC  . insulin detemir  5 Units Subcutaneous Daily  . oxybutynin  15 mg Oral QHS  . risperidone  4 mg Oral Daily  . sodium chloride flush  3 mL Intravenous Once  . sodium chloride flush  3 mL Intravenous Q12H   Continuous Infusions: . sodium chloride       LOS: 2 days        Jewelz Ricklefs Gerome Apley, MD

## 2019-12-11 NOTE — Progress Notes (Signed)
Nutrition Brief Note  RD working remotely.  RD consulted for assessment of nutritional requirements/ status secondary to morbid obesity. MD requesting education regarding lifestyle modifications for obesity.   Wt Readings from Last 15 Encounters:  12/11/19 (!) 160.9 kg  11/07/19 (!) 156.5 kg  10/03/19 (!) 157.4 kg  08/15/19 (!) 145.2 kg  07/18/19 (!) 146.5 kg  07/13/19 (!) 147 kg  05/23/19 (!) 157.4 kg  03/14/19 (!) 151 kg  11/28/18 (!) 151.5 kg  10/11/18 (!) 149.2 kg  10/04/18 (!) 155 kg  09/16/18 (!) 151.4 kg  08/22/18 (!) 148.3 kg  08/16/18 (!) 150.1 kg  08/13/18 (!) 147.9 kg   54 y.o. female with medical history significant of chronic diastolic congestive heart failure, hypertension, persistent atrial fibrillation, pulmonary hypertension, SVT, type 2 diabetes, morbid obesity (BMI 52), OSA, bipolar disorder presented to ED with complaints of chest pain, shortness of breath, and lower extremity edema.  Pt admitted with CHF exacerbation.   Reviewed I/O's: -1.1 L x 24 hours and -2.5 L since admission  UOP: 2 L x 24 hours  Attempted to speak with pt via phone, however, no answer. RD will target educational efforts on CHF, as this is what admitted pt to the hospital.  Provided "Heart Healthy, Consistent Carbohydrate Nutrition Therapy" handout from [facility]. RD attached to AVS/ discharge instructions.   Lab Results  Component Value Date   HGBA1C 7.6 (H) 11/07/2019  PTA DM medications are 1000 mg metformin BID, 10 mg glipizide daily.   Labs reviewed: CBGS: [id number] (inpatient orders for glycemic control are 0-15 units insulin aspart TID with meals and 10 units insulin detemir daily).   Body mass index is 55.57 kg/m. Patient meets criteria for extreme obesity, class III based on current BMI. Obesity is a complex, chronic medical condition that is optimally managed by a multidisciplinary care team. Weight loss is not an ideal goal for an acute inpatient  hospitalization. However, if further work-up for obesity is warranted, consider outpatient referral to outpatient bariatric service and/or [facility].   Current diet order is heart healthy/ carb modified with 1.2 L fluid restriction, patient is consuming approximately 75-100% of meals at this time. Labs and medications reviewed.   No nutrition interventions warranted at this time. If nutrition issues arise, please consult RD.   Loistine Chance, RD, LDN, [place] Registered Dietitian II Certified Diabetes Care and Education Specialist Please refer to [facility] for RD and/or RD on-call/weekend/after hours pager

## 2019-12-11 NOTE — Discharge Instructions (Addendum)
Heart Healthy, Consistent Carbohydrate Nutrition Therapy   A heart-healthy and consistent carbohydrate diet is recommended to manage heart disease and diabetes. To follow a heart-healthy and consistent carbohydrate diet, . Eat a balanced diet with whole grains, fruits and vegetables, and lean protein sources.  . Choose heart-healthy unsaturated fats. Limit saturated fats, trans fats, and cholesterol intake. Eat more plant-based or vegetarian meals using beans and soy foods for protein.  . Eat whole, unprocessed foods to limit the amount of sodium (salt) you eat.  . Choose a consistent amount of carbohydrate at each meal and snack. Limit refined carbohydrates especially sugar, sweets and sugar-sweetened beverages.  . If you drink alcohol, do so in moderation: one serving per day (women) and two servings per day (men). o One serving is equivalent to 12 ounces beer, 5 ounces wine, or 1.5 ounces distilled spirits  Tips Tips for Choosing Heart-Healthy Fats Choose lean protein and low-fat dairy foods to reduce saturated fat intake. . Saturated fat is usually found in animal-based protein and is associated with certain health risks. Saturated fat is the biggest contributor to raise low-density lipoprotein (LDL) cholesterol levels. Research shows that limiting saturated fat lowers unhealthy cholesterol levels. Eat no more than 7% of your total calories each day from saturated fat. Ask your RDN to help you determine how much saturated fat is right for you. . There are many foods that do not contain large amounts of saturated fats. Swapping these foods to replace foods high in saturated fats will help you limit the saturated fat you eat and improve your cholesterol levels. You can also try eating more plant-based or vegetarian meals. Instead of. Try:  Whole milk, cheese, yogurt, and ice cream 1% or skim milk, low-fat cheese, non-fat yogurt, and low-fat ice cream  Fatty, marbled beef and pork Lean beef, pork,  or venison  Poultry with skin Poultry without skin  Butter, stick margarine Reduced-fat, whipped, or liquid spreads  Coconut oil, palm oil Liquid vegetable oils: corn, canola, olive, soybean and safflower oils   Avoid foods that contain trans fats. . Trans fats increase levels of LDL-cholesterol. Hydrogenated fat in processed foods is the main source of trans fats in foods.  . Trans fats can be found in stick margarine, shortening, processed sweets, baked goods, some fried foods, and packaged foods made with hydrogenated oils. Avoid foods with "partially hydrogenated oil" on the ingredient list such as: cookies, pastries, baked goods, biscuits, crackers, microwave popcorn, and frozen dinners. Choose foods with heart healthy fats. . Polyunsaturated and monounsaturated fat are unsaturated fats that may help lower your blood cholesterol level when used in place of saturated fat in your diet. . Ask your RDN about taking a dietary supplement with plant sterols and stanols to help lower your cholesterol level. Marland Kitchen Research shows that substituting saturated fats with unsaturated fats is beneficial to cholesterol levels. Try these easy swaps: Instead of. Try:  Butter, stick margarine, or solid shortening Reduced-fat, whipped, or liquid spreads  Beef, pork, or poultry with skin Fish and seafood  Chips, crackers, snack foods Raw or unsalted nuts and seeds or nut butters Hummus with vegetables Avocado on toast  Coconut oil, palm oil Liquid vegetable oils: corn, canola, olive, soybean and safflower oils  Limit the amount of cholesterol you eat to less than 200 milligrams per day. . Cholesterol is a substance carried through the bloodstream via lipoproteins, which are known as "transporters" of fat. Some body functions need cholesterol to work properly, but too much  cholesterol in the bloodstream can damage arteries and build up blood vessel linings (which can lead to heart attack and stroke). You should eat  less than 200 milligrams cholesterol per day. Marland Kitchen People respond differently to eating cholesterol. There is no test available right now that can figure out which people will respond more to dietary cholesterol and which will respond less. For individuals with high intake of dietary cholesterol, different types of increase (none, small, moderate, large) in LDL-cholesterol levels are all possible.  . Food sources of cholesterol include egg yolks and organ meats such as liver, gizzards. Limit egg yolks to two to four per week and avoid organ meats like liver and gizzards to control cholesterol intake. Tips for Choosing Heart-Healthy Carbohydrates Consume a consistent amount of carbohydrate . It is important to eat foods with carbohydrates in moderation because they impact your blood glucose level. Carbohydrates can be found in many foods such as: . Grains (breads, crackers, rice, pasta, and cereals)  . Starchy Vegetables (potatoes, corn, and peas)  . Beans and legumes  . Milk, soy milk, and yogurt  . Fruit and fruit juice  . Sweets (cakes, cookies, ice cream, jam and jelly) . Your RDN will help you set a goal for how many carbohydrate servings to eat at your meals and snacks. For many adults, eating 3 to 5 servings of carbohydrate foods at each meal and 1 or 2 carbohydrate servings for each snack works well.  . Check your blood glucose level regularly. It can tell you if you need to adjust when you eat carbohydrates. . Choose foods rich in viscous (soluble) fiber . Viscous, or soluble, is found in the walls of plant cells. Viscous fiber is found only in plant-based foods. Eating foods with fiber helps to lower your unhealthy cholesterol and keep your blood glucose in range  . Rich sources of viscous fiber include vegetables (asparagus, Brussels sprouts, sweet potatoes, turnips) fruit (apricots, mangoes, oranges), legumes, and whole grains (barley, oats, and oat bran).  . As you increase your fiber  intake gradually, also increase the amount of water you drink. This will help prevent constipation.  . If you have difficulty achieving this goal, ask your RDN about fiber laxatives. Choose fiber supplements made with viscous fibers such as psyllium seed husks or methylcellulose to help lower unhealthy cholesterol.  . Limit refined carbohydrates  . There are three types of carbohydrates: starches, sugar, and fiber. Some carbohydrates occur naturally in food, like the starches in rice or corn or the sugars in fruits and milk. Refined carbohydrates--foods with high amounts of simple sugars--can raise triglyceride levels. High triglyceride levels are associated with coronary heart disease. . Some examples of refined carbohydrate foods are table sugar, sweets, and beverages sweetened with added sugar. Tips for Reducing Sodium (Salt) Although sodium is important for your body to function, too much sodium can be harmful for people with high blood pressure. As sodium and fluid buildup in your tissues and bloodstream, your blood pressure increases. High blood pressure may cause damage to other organs and increase your risk for a stroke. Even if you take a pill for blood pressure or a water pill (diuretic) to remove fluid, it is still important to have less salt in your diet. Ask your doctor and RDN what amount of sodium is right for you. Marland Kitchen Avoid processed foods. Eat more fresh foods.  . Fresh fruits and vegetables are naturally low in sodium, as well as frozen vegetables and fruits that have  no added juices or sauces.  . Fresh meats are lower in sodium than processed meats, such as bacon, sausage, and hotdogs. Read the nutrition label or ask your butcher to help you find a fresh meat that is low in sodium. . Eat less salt--at the table and when cooking.  . A single teaspoon of table salt has 2,300 mg of sodium.  . Leave the salt out of recipes for pasta, casseroles, and soups.  . Ask your RDN how to cook your  favorite recipes without sodium . Be a Paramedic.  . Look for food packages that say "salt-free" or "sodium-free." These items contain less than 5 milligrams of sodium per serving.  Marland Kitchen "Very low-sodium" products contain less than 35 milligrams of sodium per serving.  Marland Kitchen "Low-sodium" products contain less than 140 milligrams of sodium per serving.  . Beware for "Unsalted" or "No Added Salt" products. These items may still be high in sodium. Check the nutrition label. . Add flavors to your food without adding sodium.  . Try lemon juice, lime juice, fruit juice or vinegar.  . Dry or fresh herbs add flavor. Try basil, bay leaf, dill, rosemary, parsley, sage, dry mustard, nutmeg, thyme, and paprika.  . Pepper, red pepper flakes, and cayenne pepper can add spice t your meals without adding sodium. Hot sauce contains sodium, but if you use just a drop or two, it will not add up to much.  Sharyn Lull a sodium-free seasoning blend or make your own at home. Additional Lifestyle Tips Achieve and maintain a healthy weight. . Talk with your RDN or your doctor about what is a healthy weight for you. . Set goals to reach and maintain that weight.  . To lose weight, reduce your calorie intake along with increasing your physical activity. A weight loss of 10 to 15 pounds could reduce LDL-cholesterol by 5 milligrams per deciliter. Participate in physical activity. . Talk with your health care team to find out what types of physical activity are best for you. Set a plan to get about 30 minutes of exercise on most days.  Foods Recommended Food Group Foods Recommended  Grains Whole grain breads and cereals, including whole wheat, barley, rye, buckwheat, corn, teff, quinoa, millet, amaranth, brown or wild rice, sorghum, and oats Pasta, especially whole wheat or other whole grain types  [employer], quinoa or wild rice Whole grain crackers, bread, rolls, pitas Home-made bread with reduced-sodium baking soda  Protein  Foods Lean cuts of beef and pork (loin, leg, round, extra lean hamburger)  Skinless Cytogeneticist and other wild game Dried beans and peas Nuts and nut butters Meat alternatives made with soy or textured vegetable protein  Egg whites or egg substitute Cold cuts made with lean meat or soy protein  Dairy Nonfat (skim), low-fat, or 1%-fat milk  Nonfat or low-fat yogurt or cottage cheese Fat-free and low-fat cheese  Vegetables Fresh, frozen, or canned vegetables without added fat or salt   Fruits Fresh, frozen, canned, or dried fruit   Oils Unsaturated oils (corn, olive, peanut, soy, sunflower, canola)  Soft or liquid margarines and vegetable oil spreads  Salad dressings Seeds and nuts  Avocado   Foods Not Recommended Food Group Foods Not Recommended  Grains Breads or crackers topped with salt Cereals (hot or cold) with more than 300 mg sodium per serving Biscuits, cornbread, and other "quick" breads prepared with baking soda Bread crumbs or stuffing mix from a store High-fat bakery products, such as  doughnuts, biscuits, croissants, danish pastries, pies, cookies Instant cooking foods to which you add hot water and stir--potatoes, noodles, rice, etc. Packaged starchy foods--seasoned noodle or rice dishes, stuffing mix, macaroni and cheese dinner Snacks made with partially hydrogenated oils, including chips, cheese puffs, snack mixes, regular crackers, butter-flavored popcorn  Protein Foods Higher-fat cuts of meats (ribs, t-bone steak, regular hamburger) Bacon, sausage, or hot dogs Cold cuts, such as salami or bologna, deli meats, cured meats, corned beef Organ meats (liver, brains, gizzards, sweetbreads) Poultry with skin Fried or smoked meat, poultry, and fish Whole eggs and egg yolks (more than 2-4 per week) Salted legumes, nuts, seeds, or nut/seed butters Meat alternatives with high levels of sodium (>300 mg per serving) or saturated fat (>5 g per serving)  Dairy Whole  milk,?2% fat milk, buttermilk Whole milk yogurt or ice cream Cream Half-&-half Cream cheese Sour cream Cheese  Vegetables Canned or frozen vegetables with salt, fresh vegetables prepared with salt, butter, cheese, or cream sauce Fried vegetables Pickled vegetables such as olives, pickles, or sauerkraut  Fruits Fried fruits Fruits served with butter or cream  Oils Butter, stick margarine, shortening Partially hydrogenated oils or trans fats Tropical oils (coconut, palm, palm kernel oils)  Other Candy, sugar sweetened soft drinks and desserts Salt, sea salt, garlic salt, and seasoning mixes containing salt Bouillon cubes Ketchup, barbecue sauce, Worcestershire sauce, soy sauce, teriyaki sauce Miso Salsa Pickles, olives, relish   Heart Healthy Consistent Carbohydrate Vegetarian (Lacto-Ovo) Sample 1-Day Menu  Breakfast 1 cup oatmeal, cooked (2 carbohydrate servings)   cup blueberries (1 carbohydrate serving)  11 almonds, without salt  1 cup 1% milk (1 carbohydrate serving)  1 cup coffee  Morning Snack 1 cup fat-free plain yogurt (1 carbohydrate serving)  Lunch 1 whole wheat bun (1 carbohydrate servings)  1 black bean burger (1 carbohydrate servings)  1 slice cheddar cheese, low sodium  2 slices tomatoes  2 leaves lettuce  1 teaspoon mustard  1 small pear (1 carbohydrate servings)  1 cup green tea, unsweetened  Afternoon Snack 1/3 cup trail mix with nuts, seeds, and raisins, without salt (1 carbohydrate servinga)  Evening Meal  cup meatless chicken  2/3 cup brown rice, cooked (2 carbohydrate servings)  1 cup broccoli, cooked (2/3 carbohydrate serving)   cup carrots, cooked (1/3 carbohydrate serving)  2 teaspoons olive oil  1 teaspoon balsamic vinegar  1 whole wheat dinner roll (1 carbohydrate serving)  1 teaspoon margarine, soft, tub  1 cup 1% milk (1 carbohydrate serving)  Evening Snack 1 extra small banana (1 carbohydrate serving)  1 tablespoon peanut butter    Heart Healthy Consistent Carbohydrate Vegan Sample 1-Day Menu  Breakfast 1 cup oatmeal, cooked (2 carbohydrate servings)   cup blueberries (1 carbohydrate serving)  11 almonds, without salt  1 cup soymilk fortified with calcium, vitamin B12, and vitamin D  1 cup coffee  Morning Snack 6 ounces soy yogurt (1 carbohydrate servings)  Lunch 1 whole wheat bun(1 carbohydrate servings)  1 black bean burger (1 carbohydrate serving)  2 slices tomatoes  2 leaves lettuce  1 teaspoon mustard  1 small pear (1 carbohydrate servings)  1 cup green tea, unsweetened  Afternoon Snack 1/3 cup trail mix with nuts, seeds, and raisins, without salt (1 carbohydrate servings)  Evening Meal  cup meatless chicken  2/3 cup brown rice, cooked (2 carbohydrate servings)  1 cup broccoli, cooked (2/3 carbohydrate serving)   cup carrots, cooked (1/3 carbohydrate serving)  2 teaspoons olive oil  1  teaspoon balsamic vinegar  1 whole wheat dinner roll (1 carbohydrate serving)  1 teaspoon margarine, soft, tub  1 cup soymilk fortified with calcium, vitamin B12, and vitamin D  Evening Snack 1 extra small banana (1 carbohydrate serving)  1 tablespoon peanut butter    Heart Healthy Consistent Carbohydrate Sample 1-Day Menu  Breakfast 1 cup cooked oatmeal (2 carbohydrate servings)  3/4 cup blueberries (1 carbohydrate serving)  1 ounce almonds  1 cup skim milk (1 carbohydrate serving)  1 cup coffee  Morning Snack 1 cup sugar-free nonfat yogurt (1 carbohydrate serving)  Lunch 2 slices whole-wheat bread (2 carbohydrate servings)  2 ounces lean turkey breast  1 ounce low-fat Swiss cheese  1 teaspoon mustard  1 slice tomato  1 lettuce leaf  1 small pear (1 carbohydrate serving)  1 cup skim milk (1 carbohydrate serving)  Afternoon Snack 1 ounce trail mix with unsalted nuts, seeds, and raisins (1 carbohydrate serving)  Evening Meal 3 ounces salmon  2/3 cup cooked brown rice (2 carbohydrate servings)  1 teaspoon  soft margarine  1 cup cooked broccoli with 1/2 cup cooked carrots (1 carbohydrate serving  Carrots, cooked, boiled, drained, without salt  1 cup lettuce  1 teaspoon olive oil with vinegar for dressing  1 small whole grain roll (1 carbohydrate serving)  1 teaspoon soft margarine  1 cup unsweetened tea  Evening Snack 1 extra-small banana (1 carbohydrate serving)  Copyright 2020  [employer]. All rights reserved.   Information on my medicine - XARELTO (Rivaroxaban)  Why was Xarelto prescribed for you? Xarelto was prescribed for you to reduce the risk of a blood clot forming that can cause a stroke if you have a medical condition called atrial fibrillation (a type of irregular heartbeat).  What do you need to know about xarelto ? Take your Xarelto ONCE DAILY at the same time every day with your evening meal. If you have difficulty swallowing the tablet whole, you may crush it and mix in applesauce just prior to taking your dose.  Take Xarelto exactly as prescribed by your doctor and DO NOT stop taking Xarelto without talking to the doctor who prescribed the medication.  Stopping without other stroke prevention medication to take the place of Xarelto may increase your risk of developing a clot that causes a stroke.  Refill your prescription before you run out.  After discharge, you should have regular check-up appointments with your healthcare provider that is prescribing your Xarelto.  In the future your dose may need to be changed if your kidney function or weight changes by a significant amount.  What do you do if you miss a dose? If you are taking Xarelto ONCE DAILY and you miss a dose, take it as soon as you remember on the same day then continue your regularly scheduled once daily regimen the next day. Do not take two doses of Xarelto at the same time or on the same day.   Important Safety Information A possible side effect of Xarelto is bleeding. You  should call your healthcare provider right away if you experience any of the following: ? Bleeding from an injury or your nose that does not stop. ? Unusual colored urine (red or dark brown) or unusual colored stools (red or black). ? Unusual bruising for unknown reasons. ? A serious fall or if you hit your head (even if there is no bleeding).  Some medicines may interact with Xarelto and might increase your risk of   bleeding while on Xarelto. To help avoid this, consult your healthcare provider or pharmacist prior to using any new prescription or non-prescription medications, including herbals, vitamins, non-steroidal anti-inflammatory drugs (NSAIDs) and supplements.  This website has more information on Xarelto: www.[email or web address].com.   

## 2019-12-11 NOTE — Plan of Care (Signed)
  Problem: Clinical Measurements: Goal: Will remain free from infection Outcome: Completed/Met   Problem: Elimination: Goal: Will not experience complications related to urinary retention Outcome: Completed/Met

## 2019-12-11 NOTE — Progress Notes (Signed)
Physical Therapy Treatment Patient Details Name: [name] MRN: [id number] DOB: 1966/09/16 Today's Date: 12/11/2019    History of Present Illness 54 y.o. female with medical history significant of chronic diastolic congestive heart failure, hypertension, persistent atrial fibrillation, pulmonary hypertension, SVT, type 2 diabetes, morbid obesity (BMI 52), OSA, bipolar disorder presented to ED with complaints of chest pain, shortness of breath, and lower extremity edema.    PT Comments    Pt was able to increase gait to 250' today ; however, toward the end of the walk pt with increased dyspnea and O2 sats down to 86% on RA (prior to last 50' had maintained >90%).  Pt unable to perform stairs due to fatigue and declined.    Follow Up Recommendations  No PT follow up     Equipment Recommendations  None recommended by PT    Recommendations for Other Services       Precautions / Restrictions Precautions Precautions: Fall Precaution Comments: watch SpO2    Mobility  Bed Mobility               General bed mobility comments: sitting EOB upon arrival  Transfers Overall transfer level: Needs assistance Equipment used: None Transfers: Sit to/from Stand Sit to Stand: Supervision         General transfer comment: for safety  Ambulation/Gait Ambulation/Gait assistance: Supervision Gait Distance (Feet): 250 Feet Assistive device: None Gait Pattern/deviations: Step-through pattern;Wide base of support     General Gait Details: multiple cues for pursed lip breathing   Stairs Stairs: (declined at this time)           Wheelchair Mobility    Modified Rankin (Stroke Patients Only)       Balance Overall balance assessment: Needs assistance   Sitting balance-Leahy Scale: Normal     Standing balance support: No upper extremity supported;During functional activity Standing balance-Leahy Scale: Good                              Cognition  Arousal/Alertness: Awake/alert Behavior During Therapy: WFL for tasks assessed/performed Overall Cognitive Status: No family/caregiver present to determine baseline cognitive functioning                                 General Comments: required cues increased cues for pursed lip breathing      Exercises      General Comments General comments (skin integrity, edema, etc.): Pt on RA at arrival with sats 95%; pt ambulated 200' with sats maintaining >90%, however, she had increased SHOB for last 50' and sats down to 86%.  Took 30 sec rest and cues pursed lip breathing to recover to 90% - RN aware.      Pertinent Vitals/Pain Pain Assessment: No/denies pain    Home Living                      Prior Function            PT Goals (current goals can now be found in the care plan section) Progress towards PT goals: Progressing toward goals    Frequency    Min 3X/week      PT Plan Current plan remains appropriate    Co-evaluation              AM-PAC PT "6 Clicks" Mobility   Outcome Measure  Help needed turning  from your back to your side while in a flat bed without using bedrails?: None Help needed moving from lying on your back to sitting on the side of a flat bed without using bedrails?: None Help needed moving to and from a bed to a chair (including a wheelchair)?: None Help needed standing up from a chair using your arms (e.g., wheelchair or bedside chair)?: None   Help needed climbing 3-5 steps with a railing? : A Little 6 Click Score: 19    End of Session Equipment Utilized During Treatment: Oxygen Activity Tolerance: Patient limited by fatigue Patient left: in bed;with call bell/phone within reach Nurse Communication: Mobility status PT Visit Diagnosis: Difficulty in walking, not elsewhere classified (R26.2);Other (comment)(cardiopulm deficits)     Time: [id number] PT Time Calculation (min) (ACUTE ONLY): 10 min  Charges:  $Gait  Training: 8-22 mins                     Maggie Font, PT Acute Rehab Services Pager [phone] [facility] [phone] [facility] 12/11/2019, 11:16 AM

## 2019-12-12 LAB — BASIC METABOLIC PANEL
Anion gap: 9 (ref 5–15)
BUN: 13 mg/dL (ref 6–20)
CO2: 28 mmol/L (ref 22–32)
Calcium: 8.8 mg/dL — ABNORMAL LOW (ref 8.9–10.3)
Chloride: 101 mmol/L (ref 98–111)
Creatinine, Ser: 0.74 mg/dL (ref 0.44–1.00)
GFR calc Af Amer: >60 mL/min (ref >60)
GFR calc non Af Amer: >60 mL/min (ref >60)
Glucose, Bld: 181 mg/dL — ABNORMAL HIGH (ref 70–99)
Potassium: 3.7 mmol/L (ref 3.5–5.1)
Sodium: 138 mmol/L (ref 135–145)

## 2019-12-12 LAB — GLUCOSE, CAPILLARY
Glucose-Capillary: 153 mg/dL — ABNORMAL HIGH (ref 70–99)
Glucose-Capillary: 168 mg/dL — ABNORMAL HIGH (ref 70–99)
Glucose-Capillary: 170 mg/dL — ABNORMAL HIGH (ref 70–99)
Glucose-Capillary: 150 mg/dL — ABNORMAL HIGH (ref 70–99)

## 2019-12-12 MED ORDER — POTASSIUM CHLORIDE CRYS ER 20 MEQ PO TBCR
40.0000 meq | EXTENDED_RELEASE_TABLET | Freq: Once | ORAL | Status: AC
  Administered 2019-12-12: 40 meq via ORAL
  Filled 2019-12-12: qty 2

## 2019-12-12 MED ORDER — FUROSEMIDE 10 MG/ML IJ SOLN
80.0000 mg | Freq: Two times a day (BID) | INTRAMUSCULAR | Status: DC
  Administered 2019-12-12 – 2019-12-14 (×4): 80 mg via INTRAVENOUS
  Filled 2019-12-12 (×4): qty 8

## 2019-12-12 MED FILL — LOSARTAN POTASSIUM 100 MG T: 100 | 90 days supply | Qty: 90 | Fill #1

## 2019-12-12 MED FILL — POTASSIUM CL ER 20 MEQ TABL: 20 | 90 days supply | Qty: 180 | Fill #1

## 2019-12-12 MED FILL — AMLODIPINE BESYLATE 10 MG T: 10 | 90 days supply | Qty: 90 | Fill #1

## 2019-12-12 MED FILL — risperiDONE 4 MG TABS: 4 | 90 days supply | Qty: 90 | Fill #1

## 2019-12-12 MED FILL — XARELTO 20 MG TABLET: 20 | 90 days supply | Qty: 90 | Fill #1

## 2019-12-12 NOTE — Plan of Care (Signed)
  Problem: Education: Goal: Ability to demonstrate management of disease process will improve Outcome: Progressing Goal: Ability to verbalize understanding of medication therapies will improve Outcome: Progressing   

## 2019-12-12 NOTE — Progress Notes (Signed)
PROGRESS NOTE    [name]  [id number] DOB: Feb 15, 1966 DOA: 12/08/2019 PCP: [name], NP    Brief Narrative:  Patient was admitted to the hospital with working diagnosis of acute decompensation of diastolic heart failure, complicated with cardiogenic pulmonary edema  54 year old female who presented with chest pain, dyspnea lower extremity edema. She does have significant past medical history for chronic diastolic heart failure, hypertension, atrial fibrillation, pulmonary hypertension, type 2 diabetes mellitus, obstructive sleep apnea and morbid obesity.   Patient reported 1 week of progressive and worsening dyspnea, associated with productive cough, orthopnea, PND, increased abdominal girth and lower extremity edema. She gained 15 pounds in the last 4 weeks. 24 hours prior to hospitalization she developed substernal left-sided chest pressure, which was persistent. Her initial physical examination blood pressure 149/86 heart rate 95, respiratory rate23,oxygen saturation 95% her lungs had rales bilaterally, positive JVD, heart S1-S2, present rhythmic, abdomen distended, positive lower extremity edema. Sodium 142, potassium 3.7, chloride 107, bicarb 24, glucose 204, BUN 10, creatinine 0.72, BNP 391, troponin 94, white count 5.5, hemoglobin 10.6, hematocrit 34.9, platelets 231.SARS COVID-19 negative. Chest radiograph with cardiomegaly, bilateral interstitial infiltrates with vascular congestion. EKG had 111 bpm, normal axis, no QRS prolongation,atrial fibrillation rhythm, poor R wave progression, no ST segment or T wave changes, low voltage.  Patient was admitted to the telemetry ward, and started with aggressive diuresis with intravenous furosemide.  Patient is responding well to diuresis with furosemide.   Assessment & Plan:   Principal Problem:   Acute exacerbation of CHF (congestive heart failure) (HCC) Active Problems:   HTN (hypertension)   Type 2  diabetes mellitus (HCC)   Chest pain   A-fib (HCC)   Class 3 obesity   1. Acute on chronic diastolic heart failure decompensationin the setting of suspected pulmonary hypertension.  Echocardiogram with preserved LV systolic function EF 65 to 70%. Elevated pulmonary systolic pressures, 46 mmHg.  Urine output over last 24 H is 1,200 ml, her symptoms continue to improve but not yet back to baseline, at home on furosemide 80 mg po bid.   Will increase furosemide to 80 mg Iv q12, and continue close monitoring of renal function and blood pressure. Continue carvedilol, holding on ace inh due to risk of hypotension.  Out of bed to chair and ambulation. Follow up nutrition recommendations, patient will need follow up as outpatient for weight reduction programs.   2. HTN.Her blood pressure has been stable 121/80. On amlodipine and carvedilol.  3. Atrial fibrillation chronic.Rate control with carvedilol 25 mg bid and anticoagulation with rivaroxaban.   4. Uncontrolled T2DM (Hgb A1c 7,6)/ dyslipidemia.Fating glucose is 181 this am. Continue basal insulin with 14 units and insulin sliding scale for glucose cover and monitoring.  Continue withatorvastatin.   5. Obesity class 3 with BMI >40.will need outpatient weight reduction programs.   6. Depression.Onrisperidone and escitalopram.  7. Diuretic induced hypokalemia. Serum K today down to 3,7, with a preserved renal function per serum cr at 0,75 and bicarbonate at 27.  K correction with Kcl and follow Mg in am, keep K at 4 and Mg at 2.   DVT prophylaxis:rivaroxaban Code Status:full Family Communication:no family at the bedside   Disposition Plan/ discharge barriers:patient from home barrier for dc decompensated heart failure, requiring IV diuresis and close inpatient monitoring.    Subjective: Patient is feeling better, dyspnea slowly improving, along with lower extremity edema, she is not yet back to her baseline,  no nausea or vomiting, no chest  pain.   Objective: Vitals:   12/12/19 0009 12/12/19 0353 12/12/19 0354 12/12/19 0740  BP: (!) 125/55 (!) 139/92  121/80  Pulse: 67 88  77  Resp: 17 18  15   Temp: 97.8 F (36.6 C) 98.7 F (37.1 C)  98 F (36.7 C)  TempSrc: Oral Oral  Oral  SpO2: 95% 98%    Weight:   (!) 161.3 kg   Height:        Intake/Output Summary (Last 24 hours) at 12/12/2019 0854 Last data filed at 12/12/2019 0352 Gross per 24 hour  Intake 480 ml  Output 1200 ml  Net -720 ml   Filed Weights   12/10/19 0434 12/11/19 0429 12/12/19 0354  Weight: (!) 159 kg (!) 160.9 kg (!) 161.3 kg    Examination:   General: Not in pain or dyspnea, deconditioned  Neurology: Awake and alert, non focal  E ENT: no pallor, no icterus, oral mucosa moist. Wide neck Cardiovascular: No JVD. S1-S2 present, rhythmic, no gallops, rubs, or murmurs. ++ bilateral lower extremity edema. Pulmonary:positive breath sounds bilaterally, adequate air movement, no wheezing, rhonchi or rales. Gastrointestinal. Abdomen protuberant with o organomegaly, non tender, no rebound or guarding Skin. No rashes Musculoskeletal: no joint deformities     Data Reviewed: I have personally reviewed following labs and imaging studies  CBC: Recent Labs  Lab 12/08/19 2052  WBC 5.5  HGB 10.6*  HCT 34.9*  MCV 86.2  PLT [id number]   Basic Metabolic Panel: Recent Labs  Lab 12/08/19 2052 12/09/19 1407 12/10/19 0532 12/11/19 0541 12/12/19 0317  NA 142 139 137 137 138  K 3.7 3.7 3.4* 4.1 3.7  CL 107 101 101 100 101  CO2 24 29 26 25 28   GLUCOSE 204* 168* 221* 231* 181*  BUN 10 12 14 14 13   CREATININE 0.72 0.79 0.84 0.76 0.74  CALCIUM 9.6 9.3 8.8* 8.7* 8.8*  MG  --   --   --  1.7  --    GFR: Estimated Creatinine Clearance: 133.3 mL/min (by C-G formula based on SCr of 0.74 mg/dL). Liver Function Tests: No results for input(s): AST, ALT, ALKPHOS, BILITOT, PROT, ALBUMIN in the last 168 hours. No results for input(s):  LIPASE, AMYLASE in the last 168 hours. No results for input(s): AMMONIA in the last 168 hours. Coagulation Profile: No results for input(s): INR, PROTIME in the last 168 hours. Cardiac Enzymes: No results for input(s): CKTOTAL, CKMB, CKMBINDEX, TROPONINI in the last 168 hours. BNP (last 3 results) No results for input(s): PROBNP in the last 8760 hours. HbA1C: No results for input(s): HGBA1C in the last 72 hours. CBG: Recent Labs  Lab 12/11/19 0642 12/11/19 1152 12/11/19 1656 12/11/19 2120 12/12/19 0609  GLUCAP 198* 241* 216* 332* 153*   Lipid Profile: No results for input(s): CHOL, HDL, LDLCALC, TRIG, CHOLHDL, LDLDIRECT in the last 72 hours. Thyroid Function Tests: No results for input(s): TSH, T4TOTAL, FREET4, T3FREE, THYROIDAB in the last 72 hours. Anemia Panel: No results for input(s): VITAMINB12, FOLATE, FERRITIN, TIBC, IRON, RETICCTPCT in the last 72 hours.    Radiology Studies: I have reviewed all of the imaging during this hospital visit personally     Scheduled Meds: . amLODipine  10 mg Oral Daily  . atorvastatin  40 mg Oral q1800  . carvedilol  25 mg Oral BID WC  . dextromethorphan-guaiFENesin  1 tablet Oral BID  . escitalopram  10 mg Oral Daily  . furosemide  60 mg Intravenous BID  . insulin aspart  0-15 Units Subcutaneous TID WC  . insulin detemir  14 Units Subcutaneous Daily  . oxybutynin  15 mg Oral QHS  . risperidone  4 mg Oral Daily  . sodium chloride flush  3 mL Intravenous Once  . sodium chloride flush  3 mL Intravenous Q12H   Continuous Infusions: . sodium chloride       LOS: 3 days        Ariatna Jester Gerome Apley, MD

## 2019-12-12 NOTE — Progress Notes (Signed)
Inpatient Diabetes Program Recommendations  AACE/ADA: New Consensus Statement on Inpatient Glycemic Control   Target Ranges:  Prepandial:   less than 140 mg/dL      Peak postprandial:   less than 180 mg/dL (1-2 hours)      Critically ill patients:  140 - 180 mg/dL   Results for [name], [name] (MRN [id number]) as of 12/12/2019 07:57  Ref. Range 12/11/2019 06:42 12/11/2019 10:56 12/11/2019 11:52 12/11/2019 16:56 12/11/2019 21:20 12/12/2019 06:09  Glucose-Capillary Latest Ref Range: 70 - 99 mg/dL 198 (H)  Novolog 3 units        Levemir 10 units 241 (H)  Novolog 5 units 216 (H)  Novolog 5 units 332 (H)  Novolog 4 units  Levemir 5 units 153 (H)  Novolog 3 units   Review of Glycemic Control  Diabetes history: DM2 Outpatient Diabetes medications: Glipizide XL 10 mg daily, Metformin 1000 mg BID Current orders for Inpatient glycemic control: Levemir 14 units daily, NOvolog 0-15 units TID with meals  Inpatient Diabetes Program Recommendations:    Insulin-Correction: Please consider ordering Novolog 0-5 units QHS.  Insulin-Meal Coverage: Please consider ordering Novolog 3 units TID with meals for meal coverage if patient eats at least 50% of meals.  Thanks, Barnie Alderman, RN, MSN, CDE Diabetes Coordinator Inpatient Diabetes Program [phone] (Team Pager from 8am to 5pm)

## 2019-12-13 LAB — BASIC METABOLIC PANEL
Anion gap: 12 (ref 5–15)
BUN: 11 mg/dL (ref 6–20)
CO2: 28 mmol/L (ref 22–32)
Calcium: 8.7 mg/dL — ABNORMAL LOW (ref 8.9–10.3)
Chloride: 102 mmol/L (ref 98–111)
Creatinine, Ser: 0.73 mg/dL (ref 0.44–1.00)
GFR calc Af Amer: >60 mL/min (ref >60)
GFR calc non Af Amer: >60 mL/min (ref >60)
Glucose, Bld: 148 mg/dL — ABNORMAL HIGH (ref 70–99)
Potassium: 3.7 mmol/L (ref 3.5–5.1)
Sodium: 142 mmol/L (ref 135–145)

## 2019-12-13 LAB — GLUCOSE, CAPILLARY
Glucose-Capillary: 147 mg/dL — ABNORMAL HIGH (ref 70–99)
Glucose-Capillary: 270 mg/dL — ABNORMAL HIGH (ref 70–99)
Glucose-Capillary: 204 mg/dL — ABNORMAL HIGH (ref 70–99)
Glucose-Capillary: 278 mg/dL — ABNORMAL HIGH (ref 70–99)

## 2019-12-13 LAB — MAGNESIUM: Magnesium: 2 mg/dL (ref 1.7–2.4)

## 2019-12-13 MED ORDER — RIVAROXABAN 20 MG PO TABS
20.0000 mg | ORAL_TABLET | Freq: Every day | ORAL | Status: DC
  Administered 2019-12-13 – 2019-12-18 (×6): 20 mg via ORAL
  Filled 2019-12-13 (×7): qty 1

## 2019-12-13 NOTE — Progress Notes (Signed)
PROGRESS NOTE    [name]  [id number] DOB: [birth year] DOA: 12/08/2019 PCP: [name], NP    Brief Narrative:  Patient was admitted to the hospital with working diagnosis of acute decompensation of diastolic heart failure, complicated with cardiogenic pulmonary edema  54 year old female who presented with chest pain, dyspnea lower extremity edema. She does have significant past medical history for chronic diastolic heart failure, hypertension, atrial fibrillation, pulmonary hypertension, type 2 diabetes mellitus, obstructive sleep apnea and morbid obesity.   Patient reported 1 week of progressive and worsening dyspnea, associated with productive cough, orthopnea, PND, increased abdominal girth and lower extremity edema. She gained 15 pounds in the last 4 weeks. 24 hours prior to hospitalization she developed substernal left-sided chest pressure, which was persistent. Her initial physical examination blood pressure 149/86 heart rate 95, respiratory rate23,oxygen saturation 95% her lungs had rales bilaterally, positive JVD, heart S1-S2, present rhythmic, abdomen distended, positive lower extremity edema. Sodium 142, potassium 3.7, chloride 107, bicarb 24, glucose 204, BUN 10, creatinine 0.72, BNP 391, troponin 94, white count 5.5, hemoglobin 10.6, hematocrit 34.9, platelets 231.SARS COVID-19 negative. Chest radiograph with cardiomegaly, bilateral interstitial infiltrates with vascular congestion. EKG had 111 bpm, normal axis, no QRS prolongation,atrial fibrillation rhythm, poor R wave progression, no ST segment or T wave changes, low voltage.  Patient was admitted to the telemetry ward, and started with aggressive diuresis with intravenous furosemide.  Patient is responding well to diuresis with furosemide.    Assessment & Plan:   Principal Problem:   Acute exacerbation of CHF (congestive heart failure) (HCC) Active Problems:   HTN (hypertension)   Type 2  diabetes mellitus (HCC)   Chest pain   A-fib (HCC)   Class 3 obesity   1. Acute on chronic diastolic heart failure decompensationin the setting of suspected pulmonary hypertension. Echocardiogram with preserved LV systolic function EF 65 to 70%. Elevated pulmonary systolic pressures, 46 mmHg.  Urineoutput over last 24 H is2,181ml, continue to improve her symptoms, but not yet back to baseline.   Continue with furosemide to 80 mg Iv q12. Heart failure manegenet with carvedilol. No ace inh due to risk of hypotension.   Continue to encourage mobility.   2. HTN.Continue blood pressure control with amlodipine and carvedilol.  3. Atrial fibrillation chronic.carvedilol25 mg bidfor rate control and rivaroxaban for anticoagulation.   4. Uncontrolled T2DM (Hgb A1c 7,6)/ dyslipidemia.Fating glucose is 148 this am. Tolerating well basal insulin with 14 units, plus insulin sliding scale.   On atorvastatin.   5. Obesity class 3 with BMI >40.follow as outpatient,   6. Depression.Continue withrisperidone and escitalopram.  7. Diuretic induced hypokalemia.  K today down to 3,7, with serum cr at 0,73 and bicarbonate at 28.  Mg at 2. Will continue K correction with 40 meq kcl follow renal panel in am.  DVT prophylaxis:rivaroxaban Code Status:full Family Communication:no family at the bedside   Disposition Plan/ discharge barriers:patient from home barrier for dc decompensated heart failure, requiring IV diuresis and close inpatient monitoring.   Subjective: Patient continue to feel better but not yet back to baseline, no pnd or orthopnea, but continue to have pitting lower extremity edema, no nausea or vomiting, dyspnea is improving.   Objective: Vitals:   12/13/19 0401 12/13/19 0816 12/13/19 0823 12/13/19 1306  BP: (!) 128/54 (!) 154/104 (!) 170/72 (!) 147/93  Pulse: 90 76 73 85  Resp: 17 16  18   Temp: 98 F (36.7 C) 98.4 F (36.9 C)  97.9 F (36.6 C)  TempSrc: Oral Oral  Oral  SpO2: 92% 92%  95%  Weight: (!) 160.4 kg     Height:        Intake/Output Summary (Last 24 hours) at 12/13/2019 1333 Last data filed at 12/13/2019 1233 Gross per 24 hour  Intake 600 ml  Output 2600 ml  Net -2000 ml   Filed Weights   12/11/19 0429 12/12/19 0354 12/13/19 0401  Weight: (!) 160.9 kg (!) 161.3 kg (!) 160.4 kg    Examination:   General: Not in pain or dyspnea, deconditioned,  Neurology: Awake and alert, non focal  E ENT: mild pallor, no icterus, oral mucosa moist/ wide neck Cardiovascular: No JVD. S1-S2 present, rhythmic, no gallops, rubs, or murmurs. +++/ pitting  lower extremity edema bilateral. Pulmonary: positive breath sounds bilaterally, adequate air movement, no wheezing, rhonchi or rales. Gastrointestinal. Abdomen with  no organomegaly, non tender, no rebound or guarding Skin. No rashes Musculoskeletal: no joint deformities     Data Reviewed: I have personally reviewed following labs and imaging studies  CBC: Recent Labs  Lab 12/08/19 2052  WBC 5.5  HGB 10.6*  HCT 34.9*  MCV 86.2  PLT [id number]   Basic Metabolic Panel: Recent Labs  Lab 12/09/19 1407 12/10/19 0532 12/11/19 0541 12/12/19 0317 12/13/19 0526  NA 139 137 137 138 142  K 3.7 3.4* 4.1 3.7 3.7  CL 101 101 100 101 102  CO2 29 26 25 28 28   GLUCOSE 168* 221* 231* 181* 148*  BUN 12 14 14 13 11   CREATININE 0.79 0.84 0.76 0.74 0.73  CALCIUM 9.3 8.8* 8.7* 8.8* 8.7*  MG  --   --  1.7  --  2.0   GFR: Estimated Creatinine Clearance: 132.8 mL/min (by C-G formula based on SCr of 0.73 mg/dL). Liver Function Tests: No results for input(s): AST, ALT, ALKPHOS, BILITOT, PROT, ALBUMIN in the last 168 hours. No results for input(s): LIPASE, AMYLASE in the last 168 hours. No results for input(s): AMMONIA in the last 168 hours. Coagulation Profile: No results for input(s): INR, PROTIME in the last 168 hours. Cardiac Enzymes: No results for input(s): CKTOTAL, CKMB,  CKMBINDEX, TROPONINI in the last 168 hours. BNP (last 3 results) No results for input(s): PROBNP in the last 8760 hours. HbA1C: No results for input(s): HGBA1C in the last 72 hours. CBG: Recent Labs  Lab 12/12/19 1147 12/12/19 1651 12/12/19 2139 12/13/19 0625 12/13/19 1252  GLUCAP 168* 170* 150* 147* 270*   Lipid Profile: No results for input(s): CHOL, HDL, LDLCALC, TRIG, CHOLHDL, LDLDIRECT in the last 72 hours. Thyroid Function Tests: No results for input(s): TSH, T4TOTAL, FREET4, T3FREE, THYROIDAB in the last 72 hours. Anemia Panel: No results for input(s): VITAMINB12, FOLATE, FERRITIN, TIBC, IRON, RETICCTPCT in the last 72 hours.    Radiology Studies: I have reviewed all of the imaging during this hospital visit personally     Scheduled Meds: . amLODipine  10 mg Oral Daily  . atorvastatin  40 mg Oral q1800  . carvedilol  25 mg Oral BID WC  . dextromethorphan-guaiFENesin  1 tablet Oral BID  . escitalopram  10 mg Oral Daily  . furosemide  80 mg Intravenous BID  . insulin aspart  0-15 Units Subcutaneous TID WC  . insulin detemir  14 Units Subcutaneous Daily  . oxybutynin  15 mg Oral QHS  . risperidone  4 mg Oral Daily  . sodium chloride flush  3 mL Intravenous Once  . sodium chloride flush  3 mL Intravenous Q12H  Continuous Infusions: . sodium chloride       LOS: 4 days        Rawleigh Rode Gerome Apley, MD

## 2019-12-13 NOTE — Plan of Care (Signed)
  Problem: Education: Goal: Ability to demonstrate management of disease process will improve Outcome: Progressing Goal: Ability to verbalize understanding of medication therapies will improve Outcome: Progressing Goal: Individualized Educational Video(s) Outcome: Progressing   Problem: Education: Goal: Ability to demonstrate management of disease process will improve Outcome: Progressing Goal: Ability to verbalize understanding of medication therapies will improve Outcome: Progressing Goal: Individualized Educational Video(s) Outcome: Progressing   

## 2019-12-13 NOTE — Progress Notes (Signed)
PT Cancellation Note  Patient Details Name: [name] MRN: [id number] DOB: 1965-12-14   Cancelled Treatment:    Reason Eval/Treat Not Completed: Pain limiting ability to participate. Pt reporting cramping pain "all over" which is preventing her from wanting to mobilize with PT today, pt educated on importance of mobility for reducing stiffness, but pt reports she is unable to participate today. The pt's RN was notified. PT will continue to follow and treat as time/schedule allow.   Karma Ganja, PT, DPT   Acute Rehabilitation Department Pager #: [phone]   [name] 12/13/2019, 2:41 PM

## 2019-12-14 LAB — GLUCOSE, CAPILLARY
Glucose-Capillary: 227 mg/dL — ABNORMAL HIGH (ref 70–99)
Glucose-Capillary: 232 mg/dL — ABNORMAL HIGH (ref 70–99)
Glucose-Capillary: 244 mg/dL — ABNORMAL HIGH (ref 70–99)
Glucose-Capillary: 295 mg/dL — ABNORMAL HIGH (ref 70–99)
Glucose-Capillary: 223 mg/dL — ABNORMAL HIGH (ref 70–99)

## 2019-12-14 LAB — BASIC METABOLIC PANEL
Anion gap: 9 (ref 5–15)
BUN: 14 mg/dL (ref 6–20)
CO2: 30 mmol/L (ref 22–32)
Calcium: 8.7 mg/dL — ABNORMAL LOW (ref 8.9–10.3)
Chloride: 101 mmol/L (ref 98–111)
Creatinine, Ser: 0.79 mg/dL (ref 0.44–1.00)
GFR calc Af Amer: >60 mL/min (ref >60)
GFR calc non Af Amer: >60 mL/min (ref >60)
Glucose, Bld: 186 mg/dL — ABNORMAL HIGH (ref 70–99)
Potassium: 3.5 mmol/L (ref 3.5–5.1)
Sodium: 140 mmol/L (ref 135–145)

## 2019-12-14 MED ORDER — POTASSIUM CHLORIDE CRYS ER 20 MEQ PO TBCR
40.0000 meq | EXTENDED_RELEASE_TABLET | Freq: Once | ORAL | Status: AC
  Administered 2019-12-14: 40 meq via ORAL
  Filled 2019-12-14: qty 2

## 2019-12-14 MED ORDER — TORSEMIDE 20 MG PO TABS
40.0000 mg | ORAL_TABLET | Freq: Two times a day (BID) | ORAL | Status: DC
  Administered 2019-12-14 – 2019-12-15 (×2): 40 mg via ORAL
  Filled 2019-12-14 (×2): qty 2

## 2019-12-14 NOTE — Progress Notes (Signed)
PT Cancellation Note  Patient Details Name: [name] MRN: [id number] DOB: June 22, 1966   Cancelled Treatment:    Reason Eval/Treat Not Completed: Patient declined, no reason specified  Attempted to see pt for PT treatment. Pt in bed upon arrival and refusing to mobilize despite max encouragement and education on benefits of OOB mobility. Pt verbalized understanding however repeating "I don't want to" and "my body aches". PT will continue to follow acutely.   Earney Navy, PTA Acute Rehabilitation Services Pager: [phone] Office: [phone]   12/14/2019, 11:49 AM

## 2019-12-14 NOTE — Progress Notes (Signed)
Brief Education Follow-Up Note  Attempted to provide pt edu x 3 over the past 24 hours. Pt either in with other members of the healthcare team or sleeping, unable to arouse for conversation. RD also attempted education on 12/11/19, but was unsuccessful at reaching pt. Per MD notes, plan to d/c home possibly tomorrow. Handouts have been attached to AVS/ discharge summary. If further nutrition issues arise, please re-consult RD.   Loistine Chance, RD, LDN, [place] Registered Dietitian II Certified Diabetes Care and Education Specialist Please refer to [facility] for RD and/or RD on-call/weekend/after hours pager

## 2019-12-14 NOTE — Plan of Care (Signed)
  Problem: Education: Goal: Ability to demonstrate management of disease process will improve Outcome: Progressing Goal: Ability to verbalize understanding of medication therapies will improve Outcome: Progressing   

## 2019-12-14 NOTE — Progress Notes (Addendum)
OT Cancellation Note  Patient Details Name: [name] MRN: [id number] DOB: December 04, 1965   Cancelled Treatment:     x2 attempt this AM. First attempt patient ask to come back at 10am "I want to sleep some more." Ot arrive ~10am however patient still sleeping heavily, difficult to arouse. When asked what's going on patient states "the situation is terrible," but cannot elaborate/diffcult to understand mumbled speech. Will re-attempt at schedule allows.  Entered patient's room at approximately 1145, still sleeping soundly and unable to arouse. Will attempt 3/19 as schedule permits.  [place] OT office: [place] 12/14/2019, 10:28 AM

## 2019-12-14 NOTE — Progress Notes (Signed)
Inpatient Diabetes Program Recommendations  AACE/ADA: New Consensus Statement on Inpatient Glycemic Control   Target Ranges:  Prepandial:   less than 140 mg/dL      Peak postprandial:   less than 180 mg/dL (1-2 hours)      Critically ill patients:  140 - 180 mg/dL   Results for [name], [name] (MRN [id number]) as of 12/14/2019 10:04  Ref. Range 12/13/2019 06:25 12/13/2019 12:52 12/13/2019 17:04 12/13/2019 21:37 12/14/2019 06:12 12/14/2019 07:46  Glucose-Capillary Latest Ref Range: 70 - 99 mg/dL 147 (H) 270 (H) 204 (H) 278 (H) 227 (H) 232 (H)   Review of Glycemic Control  Diabetes history: DM2 Outpatient Diabetes medications: Glipizide XL 10 mg daily, Metformin 1000 mg BID Current orders for Inpatient glycemic control: Levemir 14 units daily, Novolog 0-15 units TID with meals  Inpatient Diabetes Program Recommendations:   Correction (SSI): Please consider adding Novolog 0-5 units QHS for bedtime correction.  Insulin - Meal Coverage: Please consider ordering Novolog 3 units TID with meals for meal coverage if patient eats at least 50% of meals.  Thanks, Barnie Alderman, RN, MSN, CDE Diabetes Coordinator Inpatient Diabetes Program [phone] (Team Pager from 8am to 5pm)

## 2019-12-14 NOTE — Progress Notes (Signed)
PROGRESS NOTE    [name]  [id number] DOB: 1966-06-18 DOA: 12/08/2019 PCP: [name], NP    Brief Narrative:  Patient was admitted to the hospital with working diagnosis of acute decompensation of diastolic heart failure, complicated with cardiogenic pulmonary edema  54 year old female who presented with chest pain, dyspnea lower extremity edema. She does have significant past medical history for chronic diastolic heart failure, hypertension, atrial fibrillation, pulmonary hypertension, type 2 diabetes mellitus, obstructive sleep apnea and morbid obesity class 3.   Patient reported 1 week of progressive and worsening dyspnea, associated with productive cough, orthopnea, PND, increased abdominal girth and lower extremity edema. She gained 15 pounds in the last 4 weeks. 24 hours prior to hospitalization she developed substernal left-sided chest pressure, which was persistent. On her initial physical examination blood pressure 149/86 heart rate 95, respiratory rate23,oxygen saturation 95% her lungs had rales bilaterally, positive JVD, heart S1-S2, present rhythmic, abdomen distended, positive lower extremity edema. Sodium 142, potassium 3.7, chloride 107, bicarb 24, glucose 204, BUN 10, creatinine 0.72, BNP 391, troponin I 94, white count 5.5, hemoglobin 10.6, hematocrit 34.9, platelets 231.SARS COVID-19 negative. Chest radiograph with cardiomegaly, bilateral interstitial infiltrates with vascular congestion. EKG had 111 bpm, normal axis, no QRS prolongation,atrial fibrillation rhythm, poor R wave progression, no ST segment or T wave changes, low voltage.  Patient was admitted to the telemetry ward, and started with aggressive diuresis with intravenous furosemide.  Patient is responding well to diuresis with IV furosemide, now transitioned to oral torsemide.     Assessment & Plan:   Principal Problem:   Acute exacerbation of CHF (congestive heart failure)  (HCC) Active Problems:   HTN (hypertension)   Type 2 diabetes mellitus (HCC)   Chest pain   A-fib (HCC)   Class 3 obesity   1. Acute on chronic diastolic heart failure decompensationin the setting of suspected pulmonary hypertension. Echocardiogram with preserved LV systolic function EF 65 to 70%. Elevated pulmonary systolic pressures, 46 mmHg.  Urineoutput over last 24 H is3,490ml, with improvement of dyspnea and lower extremity edema, but not back to baseline. Volume status is difficult to assess due to obesity, but I can see improvement in edema and level of dyspnea.   Now will transition to torsemide 40 mg po bid and continue to follow response. Patient will need aggressive life style modification and weight reduction to improve her prognosis. Continue Cpap for OSA.   Continue heart failure manegenet with carvedilol. Currently not on ace inh due to risk of hypotension, blood pressure 115/62 mmHg.   Out of bed to chair and ambulation has been encouraged.   2. HTN.Blood pressure 115/62. Continue with amlodipine and carvedilol.  3. Atrial fibrillation chronic.Continue rate control with carvedilol25 mg and anticoagulation with rivaroxaban.   4. Uncontrolled T2DM (Hgb A1c 7,6)/ dyslipidemia.Fating glucose is186this am. Continue basal insulin with 14 units, and  insulin sliding scale, for glucose cover and monitoring. Patient is tolerating po well.   Continue with atorvastatin.   5. Obesity class 3 with BMI >40.follow as outpatient, will need aggressive weight management as outpatient.   6. Depression.On risperidone and escitalopram.  7. Diuretic induced hypokalemia/ contraction metabolic alkalosis.  K today down to 3,5, with serum cr at 0,79 and bicarbonate at 30. Will add Kcl po 40 meq today and follow renal panel in am.   DVT prophylaxis:rivaroxaban Code Status:full Family Communication:no family at the bedside  Disposition Plan/ discharge  barriers:patient from home barrier for dc decompensated heart failure, now with  improving volume status, for possible dc home in am.   Subjective: Patient's symptoms continue to improve, but not yet back to baseline, dyspnea better, but continue to have lower extremity edema, no nausea or vomiting.   Objective: Vitals:   12/14/19 0054 12/14/19 0509 12/14/19 0510 12/14/19 0748  BP: 126/81  139/86 (!) 156/81  Pulse: 67  82 68  Resp: 20  18 18   Temp: 98.5 F (36.9 C)  97.8 F (36.6 C) 98 F (36.7 C)  TempSrc:   Oral Oral  SpO2: 96%  94% 92%  Weight:  (!) 158.1 kg    Height:        Intake/Output Summary (Last 24 hours) at 12/14/2019 1043 Last data filed at 12/14/2019 0900 Gross per 24 hour  Intake 480 ml  Output 2500 ml  Net -2020 ml   Filed Weights   12/12/19 0354 12/13/19 0401 12/14/19 0509  Weight: (!) 161.3 kg (!) 160.4 kg (!) 158.1 kg    Examination:   General: Not in pain or dyspnea, deconditioned  Neurology: This am somnolent but easy to arouse.  E ENT: mild pallor, no icterus, oral mucosa moist Cardiovascular: No JVD(wide neck). S1-S2 present, rhythmic, no gallops, rubs, or murmurs. +++ bilateral lower extremity edema. Pulmonary: positive breath sounds bilaterally, with no wheezing, rhonchi or rales. Gastrointestinal. Abdomen protuberant with no organomegaly, non tender, no rebound or guarding Skin. No rashes Musculoskeletal: no joint deformities     Data Reviewed: I have personally reviewed following labs and imaging studies  CBC: Recent Labs  Lab 12/08/19 2052  WBC 5.5  HGB 10.6*  HCT 34.9*  MCV 86.2  PLT [id number]   Basic Metabolic Panel: Recent Labs  Lab 12/10/19 0532 12/11/19 0541 12/12/19 0317 12/13/19 0526 12/14/19 0517  NA 137 137 138 142 140  K 3.4* 4.1 3.7 3.7 3.5  CL 101 100 101 102 101  CO2 26 25 28 28 30   GLUCOSE 221* 231* 181* 148* 186*  BUN 14 14 13 11 14   CREATININE 0.84 0.76 0.74 0.73 0.79  CALCIUM 8.8* 8.7* 8.8* 8.7* 8.7*  MG   --  1.7  --  2.0  --    GFR: Estimated Creatinine Clearance: 131.6 mL/min (by C-G formula based on SCr of 0.79 mg/dL). Liver Function Tests: No results for input(s): AST, ALT, ALKPHOS, BILITOT, PROT, ALBUMIN in the last 168 hours. No results for input(s): LIPASE, AMYLASE in the last 168 hours. No results for input(s): AMMONIA in the last 168 hours. Coagulation Profile: No results for input(s): INR, PROTIME in the last 168 hours. Cardiac Enzymes: No results for input(s): CKTOTAL, CKMB, CKMBINDEX, TROPONINI in the last 168 hours. BNP (last 3 results) No results for input(s): PROBNP in the last 8760 hours. HbA1C: No results for input(s): HGBA1C in the last 72 hours. CBG: Recent Labs  Lab 12/13/19 1252 12/13/19 1704 12/13/19 2137 12/14/19 0612 12/14/19 0746  GLUCAP 270* 204* 278* 227* 232*   Lipid Profile: No results for input(s): CHOL, HDL, LDLCALC, TRIG, CHOLHDL, LDLDIRECT in the last 72 hours. Thyroid Function Tests: No results for input(s): TSH, T4TOTAL, FREET4, T3FREE, THYROIDAB in the last 72 hours. Anemia Panel: No results for input(s): VITAMINB12, FOLATE, FERRITIN, TIBC, IRON, RETICCTPCT in the last 72 hours.    Radiology Studies: I have reviewed all of the imaging during this hospital visit personally     Scheduled Meds: . amLODipine  10 mg Oral Daily  . atorvastatin  40 mg Oral q1800  . carvedilol  25 mg  Oral BID WC  . dextromethorphan-guaiFENesin  1 tablet Oral BID  . escitalopram  10 mg Oral Daily  . insulin aspart  0-15 Units Subcutaneous TID WC  . insulin detemir  14 Units Subcutaneous Daily  . oxybutynin  15 mg Oral QHS  . risperidone  4 mg Oral Daily  . rivaroxaban  20 mg Oral Q supper  . sodium chloride flush  3 mL Intravenous Once  . sodium chloride flush  3 mL Intravenous Q12H  . torsemide  40 mg Oral BID   Continuous Infusions: . sodium chloride       LOS: 5 days        Jasim Harari Gerome Apley, MD

## 2019-12-15 ENCOUNTER — Ambulatory Visit: Payer: Medicaid Other

## 2019-12-15 DIAGNOSIS — E7800 Pure hypercholesterolemia, unspecified: Secondary | ICD-10-CM

## 2019-12-15 DIAGNOSIS — E662 Morbid (severe) obesity with alveolar hypoventilation: Secondary | ICD-10-CM

## 2019-12-15 DIAGNOSIS — G4733 Obstructive sleep apnea (adult) (pediatric): Secondary | ICD-10-CM

## 2019-12-15 DIAGNOSIS — F329 Major depressive disorder, single episode, unspecified: Secondary | ICD-10-CM | POA: Diagnosis present

## 2019-12-15 DIAGNOSIS — I482 Chronic atrial fibrillation, unspecified: Secondary | ICD-10-CM

## 2019-12-15 DIAGNOSIS — F320 Major depressive disorder, single episode, mild: Secondary | ICD-10-CM

## 2019-12-15 DIAGNOSIS — E1165 Type 2 diabetes mellitus with hyperglycemia: Secondary | ICD-10-CM

## 2019-12-15 DIAGNOSIS — E118 Type 2 diabetes mellitus with unspecified complications: Secondary | ICD-10-CM

## 2019-12-15 DIAGNOSIS — F32A Depression, unspecified: Secondary | ICD-10-CM | POA: Diagnosis present

## 2019-12-15 DIAGNOSIS — F3162 Bipolar disorder, current episode mixed, moderate: Secondary | ICD-10-CM

## 2019-12-15 DIAGNOSIS — Z9989 Dependence on other enabling machines and devices: Secondary | ICD-10-CM

## 2019-12-15 LAB — CBC WITH DIFFERENTIAL/PLATELET
Abs Immature Granulocytes: 0.02 10*3/uL (ref 0.00–0.07)
Basophils Absolute: 0 10*3/uL (ref 0.0–0.1)
Basophils Relative: 1 %
Eosinophils Absolute: 0 10*3/uL (ref 0.0–0.5)
Eosinophils Relative: 1 %
HCT: 32.5 % — ABNORMAL LOW (ref 36.0–46.0)
Hemoglobin: 9.9 g/dL — ABNORMAL LOW (ref 12.0–15.0)
Immature Granulocytes: 0 %
Lymphocytes Relative: 21 %
Lymphs Abs: 1.1 10*3/uL (ref 0.7–4.0)
MCH: 26.3 pg (ref 26.0–34.0)
MCHC: 30.5 g/dL (ref 30.0–36.0)
MCV: 86.4 fL (ref 80.0–100.0)
Monocytes Absolute: 0.5 10*3/uL (ref 0.1–1.0)
Monocytes Relative: 10 %
Neutro Abs: 3.3 10*3/uL (ref 1.7–7.7)
Neutrophils Relative %: 67 %
Platelets: 203 10*3/uL (ref 150–400)
RBC: 3.76 MIL/uL — ABNORMAL LOW (ref 3.87–5.11)
RDW: 15.5 % (ref 11.5–15.5)
WBC: 5 10*3/uL (ref 4.0–10.5)
nRBC: 0 % (ref 0.0–0.2)

## 2019-12-15 LAB — BASIC METABOLIC PANEL
Anion gap: 11 (ref 5–15)
BUN: 14 mg/dL (ref 6–20)
CO2: 28 mmol/L (ref 22–32)
Calcium: 8.9 mg/dL (ref 8.9–10.3)
Chloride: 103 mmol/L (ref 98–111)
Creatinine, Ser: 0.77 mg/dL (ref 0.44–1.00)
GFR calc Af Amer: >60 mL/min (ref >60)
GFR calc non Af Amer: >60 mL/min (ref >60)
Glucose, Bld: 165 mg/dL — ABNORMAL HIGH (ref 70–99)
Potassium: 3.7 mmol/L (ref 3.5–5.1)
Sodium: 142 mmol/L (ref 135–145)

## 2019-12-15 LAB — FOLATE: Folate: 11.3 ng/mL (ref >5.9)

## 2019-12-15 LAB — PHOSPHORUS: Phosphorus: 3.6 mg/dL (ref 2.5–4.6)

## 2019-12-15 LAB — RETICULOCYTES
Immature Retic Fract: 25.1 % — ABNORMAL HIGH (ref 2.3–15.9)
RBC.: 3.96 MIL/uL (ref 3.87–5.11)
Retic Count, Absolute: 84 10*3/uL (ref 19.0–186.0)
Retic Ct Pct: 2.1 % (ref 0.4–3.1)

## 2019-12-15 LAB — MAGNESIUM: Magnesium: 1.9 mg/dL (ref 1.7–2.4)

## 2019-12-15 LAB — GLUCOSE, CAPILLARY
Glucose-Capillary: 173 mg/dL — ABNORMAL HIGH (ref 70–99)
Glucose-Capillary: 173 mg/dL — ABNORMAL HIGH (ref 70–99)
Glucose-Capillary: 302 mg/dL — ABNORMAL HIGH (ref 70–99)
Glucose-Capillary: 242 mg/dL — ABNORMAL HIGH (ref 70–99)

## 2019-12-15 LAB — VITAMIN B12: Vitamin B-12: 235 pg/mL (ref 180–914)

## 2019-12-15 LAB — IRON AND TIBC
Iron: 38 ug/dL (ref 28–170)
Saturation Ratios: 8 % — ABNORMAL LOW (ref 10.4–31.8)
TIBC: 448 ug/dL (ref 250–450)
UIBC: 410 ug/dL

## 2019-12-15 LAB — FERRITIN: Ferritin: 32 ng/mL (ref 11–307)

## 2019-12-15 MED ORDER — FUROSEMIDE 10 MG/ML IJ SOLN
60.0000 mg | Freq: Three times a day (TID) | INTRAMUSCULAR | Status: DC
  Administered 2019-12-15 – 2019-12-16 (×4): 60 mg via INTRAVENOUS
  Filled 2019-12-15 (×4): qty 6

## 2019-12-15 NOTE — Plan of Care (Signed)
  Problem: Activity: Goal: Capacity to carry out activities will improve Outcome: Completed/Met   Problem: Clinical Measurements: Goal: Respiratory complications will improve Outcome: Completed/Met   Problem: Activity: Goal: Risk for activity intolerance will decrease Outcome: Completed/Met   Problem: Coping: Goal: Level of anxiety will decrease Outcome: Completed/Met   Problem: Elimination: Goal: Will not experience complications related to bowel motility Outcome: Completed/Met

## 2019-12-15 NOTE — Progress Notes (Signed)
Brief Nutrition Follow-Up Note  RD received another consult for diet education and assessment for obesity. RD has attempted to educate pt multiple times.   Obesity is a complex, chronic medical condition that is optimally managed by a multidisciplinary care team. Weight loss is not an ideal goal for an acute inpatient hospitalization. However, if further work-up for obesity is warranted, consider outpatient referral to outpatient bariatric service and/or [facility].   RD will refer pt to [facility] for further support after discharge.   Loistine Chance, RD, LDN, [place] Registered Dietitian II Certified Diabetes Care and Education Specialist Please refer to [facility] for RD and/or RD on-call/weekend/after hours pager

## 2019-12-15 NOTE — Progress Notes (Signed)
Patient does not know her home settings. Began pt on 6 pressure and weaned her down to 4 for her tolerate it. She does not require 02 bleed in.

## 2019-12-15 NOTE — Progress Notes (Signed)
PROGRESS NOTE    [name]  [id number] DOB: [birth year] DOA: 12/08/2019 PCP: [name], NP     Brief Narrative:  54 year old BF PMHx Chronic diastolic CHF, HTN, Atrial Fibrillation, Pulmonary HTN, DM type 2 uncontrolled with complication, OSA, Morbid obesity class 3.   Presented with chest pain, dyspnea lower extremity edema. She does have significant past medical history for   Patient reported 1 week of progressive and worsening dyspnea, associated with productive cough, orthopnea, PND, increased abdominal girth and lower extremity edema. She gained 15 pounds in the last 4 weeks. 24 hours prior to hospitalization she developed substernal left-sided chest pressure, which was persistent. On her initial physical examination blood pressure 149/86 heart rate 95, respiratory rate23,oxygen saturation 95% her lungs had rales bilaterally, positive JVD, heart S1-S2, present rhythmic, abdomen distended, positive lower extremity edema. Sodium 142, potassium 3.7, chloride 107, bicarb 24, glucose 204, BUN 10, creatinine 0.72, BNP 391, troponin I 94, white count 5.5, hemoglobin 10.6, hematocrit 34.9, platelets 231.SARS COVID-19 negative. Chest radiograph with cardiomegaly, bilateral interstitial infiltrates with vascular congestion. EKG had 111 bpm, normal axis, no QRS prolongation,atrial fibrillation rhythm, poor R wave progression, no ST segment or T wave changes, low voltage.  Patient was admitted to the telemetry ward, and started with aggressive diuresis with intravenous furosemide.  Patient is responding well to diuresis with IV furosemide, now transitioned to oral torsemide.    Subjective: A/O x4, negative CP, currently negative S OB.  Unknown based weight.  States weighs herself daily.  States has CPAP on order.   Assessment & Plan:   Principal Problem:   Acute exacerbation of CHF (congestive heart failure) (HCC) Active Problems:   Morbid obesity (HCC)   HTN  (hypertension)   Type 2 diabetes mellitus (HCC)   Bipolar 1 disorder, mixed, moderate (HCC)   Chest pain   A-fib (HCC)   Class 3 obesity   Obesity hypoventilation syndrome (HCC)   Depression   Acute on chronic diastolic CHF decompensated/pulmonary HTN (unknown based weight) -Strict in and out -10.0 L -Daily weight Filed Weights   12/13/19 0401 12/14/19 0509 12/15/19 0445  Weight: (!) 160.4 kg (!) 158.1 kg (!) 159.1 kg  -Amlodipine 10 mg daily (with heavy diuresis may need to decrease) -Coreg 25 mg BID -Lasix IV 60 mg TID -With no base weight will restart Lasix IV and continue until creatinine begins to trend up. -Ambulate q shift -Transfuse for hemoglobin< 8  OSA/OHS  -CPAP per respiratory  Essential HTN  -See CHF  Chronic atrial fibrillation -3/19 currently NSR -See CHF -Continue rivaroxaban 20 mg daily  Anemia -Anemia panel pending -Occult blood pending  DM type II uncontrolled with complication -2/9 hemoglobin A1c= 7.6 -Meets guidelines for bariatric surgery; diabetic nutrition.  Bariatric surgery consult  HLD -Lipitor 40 mg daily -Lipid panel pending  Morbid obesity class 3 with BMI >40. -See DM type II uncontrolled -Levemir 14 units daily -Continue moderate SSI  Bipolar 1 disorder mixed moderate/depression -Risperidone  -Escitalopram.  Hypokalemia -Potassium goal> 4     DVT prophylaxis: Rivaroxaban code Status: Full Family Communication:  Disposition Plan: TBD 1.  Where the patient is from 2.  Anticipated d/c place. 3.  Barriers to d/c OR conditions which need to be met to effect a safe d/c. If patient is medically stable, please make sure this is clear: Patient is medically stable for discharge."    Consultants:    Procedures/Significant Events:    I have personally reviewed and interpreted all radiology  studies and my findings are as above.  VENTILATOR SETTINGS:    Cultures   Antimicrobials: Anti-infectives (From  admission, onward)   None       Devices    LINES / TUBES:      Continuous Infusions: . sodium chloride       Objective: Vitals:   12/15/19 0445 12/15/19 0801 12/15/19 1154 12/15/19 1727  BP:  123/90 131/86 125/85  Pulse:  84 86 71  Resp:  17 (!) 21   Temp:   (!) 97.3 F (36.3 C)   TempSrc:   Oral   SpO2:  95% 96%   Weight: (!) 159.1 kg     Height:        Intake/Output Summary (Last 24 hours) at 12/15/2019 1900 Last data filed at 12/15/2019 1750 Gross per 24 hour  Intake 1280 ml  Output 2800 ml  Net -1520 ml   Filed Weights   12/13/19 0401 12/14/19 0509 12/15/19 0445  Weight: (!) 160.4 kg (!) 158.1 kg (!) 159.1 kg    Examination:  General: A/O x4, no acute respiratory distress Eyes: negative scleral hemorrhage, negative anisocoria, negative icterus ENT: Negative Runny nose, negative gingival bleeding, Neck:  Negative scars, masses, torticollis, lymphadenopathy, JVD Lungs: Clear to auscultation bilaterally without wheezes or crackles Cardiovascular: Regular rate and rhythm without murmur gallop or rub normal S1 and S2 Abdomen: negative abdominal pain, nondistended, positive soft, bowel sounds, no rebound, no ascites, no appreciable mass Extremities: No significant cyanosis, clubbing, or edema bilateral lower extremities Skin: Negative rashes, lesions, ulcers Psychiatric:  Negative depression, negative anxiety, negative fatigue, negative mania  Central nervous system:  Cranial nerves II through XII intact, tongue/uvula midline, all extremities muscle strength 5/5, sensation intact throughout, negative dysarthria, negative expressive aphasia, negative receptive aphasia.  .     Data Reviewed: Care during the described time interval was provided by me .  I have reviewed this patient's available data, including medical history, events of note, physical examination, and all test results as part of my evaluation.  CBC: Recent Labs  Lab 12/08/19 2052  12/15/19 0929  WBC 5.5 5.0  NEUTROABS  --  3.3  HGB 10.6* 9.9*  HCT 34.9* 32.5*  MCV 86.2 86.4  PLT 231 387   Basic Metabolic Panel: Recent Labs  Lab 12/11/19 0541 12/12/19 0317 12/13/19 0526 12/14/19 0517 12/15/19 0354 12/15/19 0929  NA 137 138 142 140 142  --   K 4.1 3.7 3.7 3.5 3.7  --   CL 100 101 102 101 103  --   CO2 '25 28 28 30 28  ' --   GLUCOSE 231* 181* 148* 186* 165*  --   BUN '14 13 11 14 14  ' --   CREATININE 0.76 0.74 0.73 0.79 0.77  --   CALCIUM 8.7* 8.8* 8.7* 8.7* 8.9  --   MG 1.7  --  2.0  --   --  1.9  PHOS  --   --   --   --   --  3.6   GFR: Estimated Creatinine Clearance: 132.1 mL/min (by C-G formula based on SCr of 0.77 mg/dL). Liver Function Tests: No results for input(s): AST, ALT, ALKPHOS, BILITOT, PROT, ALBUMIN in the last 168 hours. No results for input(s): LIPASE, AMYLASE in the last 168 hours. No results for input(s): AMMONIA in the last 168 hours. Coagulation Profile: No results for input(s): INR, PROTIME in the last 168 hours. Cardiac Enzymes: No results for input(s): CKTOTAL, CKMB, CKMBINDEX, TROPONINI in the  last 168 hours. BNP (last 3 results) No results for input(s): PROBNP in the last 8760 hours. HbA1C: No results for input(s): HGBA1C in the last 72 hours. CBG: Recent Labs  Lab 12/14/19 1630 12/14/19 2123 12/15/19 0629 12/15/19 1155 12/15/19 1630  GLUCAP 295* 223* 173* 173* 302*   Lipid Profile: No results for input(s): CHOL, HDL, LDLCALC, TRIG, CHOLHDL, LDLDIRECT in the last 72 hours. Thyroid Function Tests: No results for input(s): TSH, T4TOTAL, FREET4, T3FREE, THYROIDAB in the last 72 hours. Anemia Panel: Recent Labs    12/15/19 1633  VITAMINB12 235  FOLATE 11.3  FERRITIN 32  TIBC 448  IRON 38  RETICCTPCT 2.1   Sepsis Labs: No results for input(s): PROCALCITON, LATICACIDVEN in the last 168 hours.  Recent Results (from the past 240 hour(s))  SARS CORONAVIRUS 2 (TAT 6-24 HRS) Nasopharyngeal Nasopharyngeal Swab      Status: None   Collection Time: 12/09/19  1:52 AM   Specimen: Nasopharyngeal Swab  Result Value Ref Range Status   SARS Coronavirus 2 NEGATIVE NEGATIVE Final    Comment: (NOTE) SARS-CoV-2 target nucleic acids are NOT DETECTED. The SARS-CoV-2 RNA is generally detectable in upper and lower respiratory specimens during the acute phase of infection. Negative results do not preclude SARS-CoV-2 infection, do not rule out co-infections with other pathogens, and should not be used as the sole basis for treatment or other patient management decisions. Negative results must be combined with clinical observations, patient history, and epidemiological information. The expected result is Negative. Fact Sheet for Patients: SugarRoll.be Fact Sheet for Healthcare Providers: https://www.Erline Siddoway-mathews.com/ This test is not yet approved or cleared by the [place] FDA and  has been authorized for detection and/or diagnosis of SARS-CoV-2 by FDA under an Emergency Use Authorization (EUA). This EUA will remain  in effect (meaning this test can be used) for the duration of the COVID-19 declaration under Section 56 4(b)(1) of the Act, 21 U.S.C. section 360bbb-3(b)(1), unless the authorization is terminated or revoked sooner. Performed at [facility], [place] [address] [place], [place] [address]          Radiology Studies: No results found.      Scheduled Meds: . amLODipine  10 mg Oral Daily  . atorvastatin  40 mg Oral q1800  . carvedilol  25 mg Oral BID WC  . dextromethorphan-guaiFENesin  1 tablet Oral BID  . escitalopram  10 mg Oral Daily  . furosemide  60 mg Intravenous TID  . insulin aspart  0-15 Units Subcutaneous TID WC  . insulin detemir  14 Units Subcutaneous Daily  . oxybutynin  15 mg Oral QHS  . risperidone  4 mg Oral Daily  . rivaroxaban  20 mg Oral Q supper  . sodium chloride flush  3 mL Intravenous Once  . sodium chloride  flush  3 mL Intravenous Q12H   Continuous Infusions: . sodium chloride       LOS: 6 days    Time spent:40 min    Tanyia Grabbe, Geraldo Docker, MD [facility] Pager [phone]  If 7PM-7AM, please contact night-coverage [email or web address] Password [facility] 12/15/2019, 7:00 PM

## 2019-12-15 NOTE — Progress Notes (Signed)
Physical Therapy Treatment Patient Details Name: [name] MRN: [id number] DOB: 1966/08/06 Today's Date: 12/15/2019    History of Present Illness 54 y.o. female with medical history significant of chronic diastolic congestive heart failure, hypertension, persistent atrial fibrillation, pulmonary hypertension, SVT, type 2 diabetes, morbid obesity (BMI 52), OSA, bipolar disorder presented to ED with complaints of chest pain, shortness of breath, and lower extremity edema.    PT Comments    Pt was seen for mobility after getting up to go to BR and then to wash hands.  Fatigued but also requiring cues on room air to increase RR for maintenance of minimum O2 sats.  Her plan is to get up to walk with O2 in place, but is only on 1L in her room when using it.  Follow her for strengthening of LE's but to encourage longer walks with O2 since she is not feeling quite right when up walking on room air.  Pt is not able to verbalize what is an issue with room air gait and room air standing, which is also reason to continue with her.    Follow Up Recommendations  No PT follow up     Equipment Recommendations  None recommended by PT    Recommendations for Other Services       Precautions / Restrictions Precautions Precautions: Fall Precaution Comments: watch SpO2 Restrictions Weight Bearing Restrictions: No    Mobility  Bed Mobility Overal bed mobility: Needs Assistance Bed Mobility: Supine to Sit     Supine to sit: Min guard        Transfers Overall transfer level: Needs assistance Equipment used: None Transfers: Sit to/from Stand Sit to Stand: Min guard         General transfer comment: declines to remain up in the chair at the end and prefers EOB  Ambulation/Gait Ambulation/Gait assistance: Min guard;+2 physical assistance Gait Distance (Feet): 120 Feet Assistive device: 2 person hand held assist(min guard assist) Gait Pattern/deviations: Step-through pattern;Wide base  of support;Drifts right/left;Decreased stride length(lateral stepping and shifting) Gait velocity: fast for room air walking Gait velocity interpretation: <1.31 ft/sec, indicative of household ambulator General Gait Details: multiple cues for pursed lip breathing   Stairs             Wheelchair Mobility    Modified Rankin (Stroke Patients Only)       Balance Overall balance assessment: Needs assistance Sitting-balance support: Feet supported Sitting balance-Leahy Scale: Good     Standing balance support: No upper extremity supported(min guard only for safety) Standing balance-Leahy Scale: Good                              Cognition Arousal/Alertness: Awake/alert Behavior During Therapy: WFL for tasks assessed/performed Overall Cognitive Status: No family/caregiver present to determine baseline cognitive functioning                                 General Comments: needs repetitive direction for controlling gait and to avoid overdoing      Exercises      General Comments General comments (skin integrity, edema, etc.): pt had O2 sat reading at rest of 88%, then up to 96% with effort to increase RR in sitting.  down to 87% with room air gait, but could recover with increased RR to 90%.  Replaced cannula.      Pertinent Vitals/Pain Pain Assessment: No/denies  pain    Home Living                      Prior Function            PT Goals (current goals can now be found in the care plan section) Acute Rehab PT Goals Patient Stated Goal: return home Progress towards PT goals: Progressing toward goals    Frequency    Min 3X/week      PT Plan Current plan remains appropriate    Co-evaluation              AM-PAC PT "6 Clicks" Mobility   Outcome Measure  Help needed turning from your back to your side while in a flat bed without using bedrails?: None Help needed moving from lying on your back to sitting on the side  of a flat bed without using bedrails?: None Help needed moving to and from a bed to a chair (including a wheelchair)?: None Help needed standing up from a chair using your arms (e.g., wheelchair or bedside chair)?: None Help needed to walk in hospital room?: None Help needed climbing 3-5 steps with a railing? : A Little 6 Click Score: 23    End of Session Equipment Utilized During Treatment: Oxygen Activity Tolerance: Patient limited by fatigue;Treatment limited secondary to medical complications (Comment) Patient left: in bed;with call bell/phone within reach;with nursing/sitter in room Nurse Communication: Mobility status PT Visit Diagnosis: Difficulty in walking, not elsewhere classified (R26.2);Other (comment)     Time: [id number] PT Time Calculation (min) (ACUTE ONLY): 17 min  Charges:  $Gait Training: 8-22 mins                  Ramond Dial 12/15/2019, 12:47 PM   Mee Hives, PT MS Acute Rehab Dept. Number: [place] [facility] [place]

## 2019-12-15 NOTE — Progress Notes (Signed)
Occupational Therapy Treatment Patient Details Name: [name] MRN: [id number] DOB: May 14, 1966 Today's Date: 12/15/2019    History of present illness 54 y.o. female with medical history significant of chronic diastolic congestive heart failure, hypertension, persistent atrial fibrillation, pulmonary hypertension, SVT, type 2 diabetes, morbid obesity (BMI 52), OSA, bipolar disorder presented to ED with complaints of chest pain, shortness of breath, and lower extremity edema.   OT comments  Patient at EOB on arrival.  She was on room at and SpO2 88 at rest, but with intention pursed lip breathing able to increase to 96.  She was impulsive and moving quickly today despite therapist request to slow down.  Required min guard with mobility and toilet transfer.  SpO2 dropped to 87 with mobility on room air.  Patient intermittently uses 1-2L O2. Recommended using O2 with mobility in future.  Patient having difficulty with LB dressing.  She did not want to use AE so required mod assist.  Encouraged practicing more with AE.  Will continue to follow with OT acutely to address the deficits listed below.     Follow Up Recommendations  No OT follow up;Supervision - Intermittent    Equipment Recommendations  Tub/shower seat    Recommendations for Other Services      Precautions / Restrictions Precautions Precautions: Fall Precaution Comments: watch SpO2 Restrictions Weight Bearing Restrictions: No       Mobility Bed Mobility Overal bed mobility: Needs Assistance Bed Mobility: Supine to Sit     Supine to sit: Min guard        Transfers Overall transfer level: Needs assistance Equipment used: None Transfers: Sit to/from Stand Sit to Stand: Min guard         General transfer comment: declines to remain up in the chair at the end and prefers EOB    Balance Overall balance assessment: Needs assistance Sitting-balance support: Feet supported Sitting balance-Leahy Scale: Good     Standing balance support: No upper extremity supported Standing balance-Leahy Scale: Good                             ADL either performed or assessed with clinical judgement   ADL Overall ADL's : Needs assistance/impaired Eating/Feeding: Independent;Sitting   Grooming: Wash/dry hands;Min guard;Standing               Lower Body Dressing: Moderate assistance;Sitting/lateral leans Lower Body Dressing Details (indicate cue type and reason): Did not want to use AE but unable to complete without it. Toilet Transfer: Min guard;Ambulation   Toileting- Clothing Manipulation and Hygiene: Min guard;Sitting/lateral lean;Sit to/from stand       Functional mobility during ADLs: Min guard General ADL Comments: Patient has been wearing O2 inconsistently.  Walking on room air patient gets SOB and SpO2 drops to 87.  Recommend wearing 1-2L with mobility     Vision       Perception     Praxis      Cognition Arousal/Alertness: Awake/alert Behavior During Therapy: WFL for tasks assessed/performed Overall Cognitive Status: No family/caregiver present to determine baseline cognitive functioning                                 General Comments: Impulsive - will start moving and moves quickly despite therapist warning        Exercises Exercises: Other exercises Other Exercises Other Exercises: pursed lip breathing Other Exercises: walk  173ft   Shoulder Instructions       General Comments     Pertinent Vitals/ Pain       Pain Assessment: No/denies pain  Home Living                                          Prior Functioning/Environment              Frequency  Min 2X/week        Progress Toward Goals  OT Goals(current goals can now be found in the care plan section)  Progress towards OT goals: Progressing toward goals  Acute Rehab OT Goals Patient Stated Goal: return home OT Goal Formulation: With patient Time For  Goal Achievement: 12/24/19 Potential to Achieve Goals: Good  Plan Discharge plan remains appropriate    Co-evaluation                 AM-PAC OT "6 Clicks" Daily Activity     Outcome Measure   Help from another person eating meals?: None Help from another person taking care of personal grooming?: A Little Help from another person toileting, which includes using toliet, bedpan, or urinal?: A Little Help from another person bathing (including washing, rinsing, drying)?: A Lot Help from another person to put on and taking off regular upper body clothing?: None Help from another person to put on and taking off regular lower body clothing?: A Lot 6 Click Score: 18    End of Session Equipment Utilized During Treatment: Oxygen  OT Visit Diagnosis: Other abnormalities of gait and mobility (R26.89)   Activity Tolerance Patient tolerated treatment well   Patient Left in bed;with call bell/phone within reach   Nurse Communication Mobility status        Time: [id number] OT Time Calculation (min): 17 min  Charges: OT General Charges $OT Visit: 1 Visit OT Treatments $Self Care/Home Management : 8-22 mins  August Luz, OTR/L    Phylliss Bob 12/15/2019, 1:43 PM

## 2019-12-16 LAB — LIPID PANEL
Cholesterol: 86 mg/dL (ref 0–200)
HDL: 30 mg/dL — ABNORMAL LOW (ref >40)
LDL Cholesterol: 45 mg/dL (ref 0–99)
Total CHOL/HDL Ratio: 2.9 RATIO
Triglycerides: 57 mg/dL (ref <150)
VLDL: 11 mg/dL (ref 0–40)

## 2019-12-16 LAB — COMPREHENSIVE METABOLIC PANEL
ALT: 11 U/L (ref 0–44)
AST: 19 U/L (ref 15–41)
Albumin: 3.4 g/dL — ABNORMAL LOW (ref 3.5–5.0)
Alkaline Phosphatase: 105 U/L (ref 38–126)
Anion gap: 10 (ref 5–15)
BUN: 12 mg/dL (ref 6–20)
CO2: 29 mmol/L (ref 22–32)
Calcium: 8.8 mg/dL — ABNORMAL LOW (ref 8.9–10.3)
Chloride: 100 mmol/L (ref 98–111)
Creatinine, Ser: 0.83 mg/dL (ref 0.44–1.00)
GFR calc Af Amer: >60 mL/min (ref >60)
GFR calc non Af Amer: >60 mL/min (ref >60)
Glucose, Bld: 166 mg/dL — ABNORMAL HIGH (ref 70–99)
Potassium: 3.4 mmol/L — ABNORMAL LOW (ref 3.5–5.1)
Sodium: 139 mmol/L (ref 135–145)
Total Bilirubin: 1 mg/dL (ref 0.3–1.2)
Total Protein: 7.1 g/dL (ref 6.5–8.1)

## 2019-12-16 LAB — PHOSPHORUS: Phosphorus: 3.8 mg/dL (ref 2.5–4.6)

## 2019-12-16 LAB — GLUCOSE, CAPILLARY
Glucose-Capillary: 162 mg/dL — ABNORMAL HIGH (ref 70–99)
Glucose-Capillary: 170 mg/dL — ABNORMAL HIGH (ref 70–99)
Glucose-Capillary: 236 mg/dL — ABNORMAL HIGH (ref 70–99)
Glucose-Capillary: 192 mg/dL — ABNORMAL HIGH (ref 70–99)

## 2019-12-16 LAB — MAGNESIUM: Magnesium: 1.8 mg/dL (ref 1.7–2.4)

## 2019-12-16 LAB — CBC
HCT: 34.9 % — ABNORMAL LOW (ref 36.0–46.0)
Hemoglobin: 10.6 g/dL — ABNORMAL LOW (ref 12.0–15.0)
MCH: 26.3 pg (ref 26.0–34.0)
MCHC: 30.4 g/dL (ref 30.0–36.0)
MCV: 86.6 fL (ref 80.0–100.0)
Platelets: 223 10*3/uL (ref 150–400)
RBC: 4.03 MIL/uL (ref 3.87–5.11)
RDW: 15.5 % (ref 11.5–15.5)
WBC: 4.8 10*3/uL (ref 4.0–10.5)
nRBC: 0 % (ref 0.0–0.2)

## 2019-12-16 MED ORDER — POTASSIUM CHLORIDE CRYS ER 20 MEQ PO TBCR
60.0000 meq | EXTENDED_RELEASE_TABLET | Freq: Once | ORAL | Status: AC
  Administered 2019-12-16: 60 meq via ORAL
  Filled 2019-12-16: qty 3

## 2019-12-16 MED ORDER — MAGNESIUM SULFATE 2 GM/50ML IV SOLN
2.0000 g | Freq: Once | INTRAVENOUS | Status: AC
  Administered 2019-12-16: 2 g via INTRAVENOUS
  Filled 2019-12-16: qty 50

## 2019-12-16 MED ORDER — GLIPIZIDE ER 10 MG PO TB24
10.0000 mg | ORAL_TABLET | Freq: Every evening | ORAL | Status: DC
  Administered 2019-12-16 – 2019-12-17 (×2): 10 mg via ORAL
  Filled 2019-12-16 (×4): qty 1

## 2019-12-16 MED ORDER — FUROSEMIDE 10 MG/ML IJ SOLN
60.0000 mg | Freq: Two times a day (BID) | INTRAMUSCULAR | Status: DC
  Administered 2019-12-17: 60 mg via INTRAVENOUS
  Filled 2019-12-16: qty 6

## 2019-12-16 NOTE — Progress Notes (Signed)
PROGRESS NOTE    [name]  [id number] DOB: 1966/08/11 DOA: 12/08/2019 PCP: Vanessa Pounds, NP     Brief Narrative:  54 year old BF PMHx Chronic diastolic CHF, HTN, Atrial Fibrillation, Pulmonary HTN, DM type 2 uncontrolled with complication, OSA, Morbid obesity class 3.   Presented with chest pain, dyspnea lower extremity edema. She does have significant past medical history for   Patient reported 1 week of progressive and worsening dyspnea, associated with productive cough, orthopnea, PND, increased abdominal girth and lower extremity edema. She gained 15 [name] in the last 4 weeks. 24 hours prior to hospitalization she developed substernal left-sided chest pressure, which was persistent. On her initial physical examination blood pressure 149/86 heart rate 95, respiratory rate23,oxygen saturation 95% her lungs had rales bilaterally, positive JVD, heart S1-S2, present rhythmic, abdomen distended, positive lower extremity edema. Sodium 142, potassium 3.7, chloride 107, bicarb 24, glucose 204, BUN 10, creatinine 0.72, BNP 391, troponin I 94, white count 5.5, hemoglobin 10.6, hematocrit 34.9, platelets 231.SARS COVID-19 negative. Chest radiograph with cardiomegaly, bilateral interstitial infiltrates with vascular congestion. EKG had 111 bpm, normal axis, no QRS prolongation,atrial fibrillation rhythm, poor R wave progression, no ST segment or T wave changes, low voltage.  Patient was admitted to the telemetry ward, and started with aggressive diuresis with intravenous furosemide.  Patient is responding well to diuresis with IV furosemide, now transitioned to oral torsemide.    Subjective: [name], negative CP, negative S OB.  States that she does not like wearing a CPAP even after significant time spent on counseling patient on need   Assessment & Plan:   Principal Problem:   Acute exacerbation of CHF (congestive heart failure) (HCC) Active Problems:   Morbid  obesity ([place])   HTN (hypertension)   Type 2 diabetes mellitus ([place])   Bipolar 1 disorder, mixed, moderate (HCC)   Chest pain   A-fib (HCC)   Class 3 obesity   Obesity hypoventilation syndrome (HCC)   Depression   Acute on chronic diastolic CHF decompensated/pulmonary HTN (unknown based weight) -Strict in and out -11.7 L -Daily weight Filed Weights   12/14/19 0509 12/15/19 0445 12/16/19 0535  Weight: (!) 158.1 kg (!) 159.1 kg (!) 156.9 kg  -Amlodipine 10 mg daily (with heavy diuresis may need to decrease) -Coreg 25 mg BID -3/20 decrease Lasix IV 60 mg BID -With no base weight will restart Lasix IV and continue until creatinine begins to trend up. -Ambulate q shift -Transfuse for hemoglobin< 8  OSA/OHS  -CPAP per respiratory -3/20 ambulatory SPO2 pending  Essential HTN  -See CHF  Chronic atrial fibrillation -3/19 currently NSR -See CHF -Continue rivaroxaban 20 mg daily  Anemia -Anemia panel pending -Occult blood pending  DM type II uncontrolled with complication -2/9 hemoglobin A1c= 7.6 -Meets guidelines for bariatric surgery; diabetic nutrition.  Bariatric surgery consult.  See morbid obesity  HLD -Lipitor 40 mg daily -Lipid panel pending  Morbid obesity class 3 with BMI >40. -See DM type II uncontrolled -Levemir 14 units daily -3/20 start home dose Glucotrol XL 10 mg daily -Continue moderate SSI -RD Leafy Kindle assessed patient and will refer patient to [facility] health nutrition and diabetes education service for further support upon discharge.  Bipolar 1 disorder mixed moderate/depression -Risperidone  -Escitalopram.  Hypokalemia -Potassium goal> 4 -3/20 K-Dur 60 mEq  Hypomagnesmia -Magnesium goal> 2 -Magnesium IV 2 g    DVT prophylaxis: Rivaroxaban code Status: Full Family Communication:  Disposition Plan: TBD 1.  Where the patient is from 2.  Anticipated d/c place. 3.  Barriers to d/c OR conditions which need to be met to effect  a safe d/c. If patient is medically stable, please make sure this is clear: Patient is medically stable for discharge."    Consultants:    Procedures/Significant Events:    I have personally reviewed and interpreted all radiology studies and my findings are as above.  VENTILATOR SETTINGS:    Cultures   Antimicrobials: Anti-infectives (From admission, onward)   None       Devices    LINES / TUBES:      Continuous Infusions: . sodium chloride       Objective: Vitals:   12/15/19 2113 12/15/19 2133 12/16/19 0535 12/16/19 0934  BP: (!) 143/95  120/80 120/60  Pulse: 75 74 73   Resp: '20 20 20 20  ' Temp: 98.6 F (37 C)  98 F (36.7 C)   TempSrc: Oral     SpO2: 98% 98% 93% 97%  Weight:   (!) 156.9 kg   Height:        Intake/Output Summary (Last 24 hours) at 12/16/2019 1133 Last data filed at 12/16/2019 0900 Gross per 24 hour  Intake 820 ml  Output 3900 ml  Net -3080 ml   Filed Weights   12/14/19 0509 12/15/19 0445 12/16/19 0535  Weight: (!) 158.1 kg (!) 159.1 kg (!) 156.9 kg   Physical Exam:  General: A/O x4, no acute respiratory distress Eyes: negative scleral hemorrhage, negative anisocoria, negative icterus ENT: Negative Runny nose, negative gingival bleeding, Neck:  Negative scars, masses, torticollis, lymphadenopathy, JVD Lungs: Clear to auscultation bilaterally without wheezes or crackles Cardiovascular: Regular rate and rhythm without murmur gallop or rub normal S1 and S2 Abdomen: MORBIDLY OBESE, negative abdominal pain, nondistended, positive soft, bowel sounds, no rebound, no ascites, no appreciable mass Extremities: No significant cyanosis, clubbing.  2+ bilateral pitting edema lower extremities, improved from 3/19 Skin: Negative rashes, lesions, ulcers Psychiatric:  Negative depression, negative anxiety, negative fatigue, negative mania EXTREMELY POOR understanding all multiple medical problems.   Central nervous system:  Cranial nerves II  through XII intact, tongue/uvula midline, all extremities muscle strength 5/5, sensation intact throughout, negative dysarthria, negative expressive aphasia, negative receptive aphasia. .     Data Reviewed: Care during the described time interval was provided by me .  I have reviewed this patient's available data, including medical history, events of note, physical examination, and all test results as part of my evaluation.  CBC: Recent Labs  Lab 12/15/19 0929 12/16/19 0302  WBC 5.0 4.8  NEUTROABS 3.3  --   HGB 9.9* 10.6*  HCT 32.5* 34.9*  MCV 86.4 86.6  PLT 203 161   Basic Metabolic Panel: Recent Labs  Lab 12/11/19 0541 12/11/19 0541 12/12/19 0317 12/13/19 0526 12/14/19 0517 12/15/19 0354 12/15/19 0929 12/16/19 0302  NA 137   < > 138 142 140 142  --  139  K 4.1   < > 3.7 3.7 3.5 3.7  --  3.4*  CL 100   < > 101 102 101 103  --  100  CO2 25   < > '28 28 30 28  ' --  29  GLUCOSE 231*   < > 181* 148* 186* 165*  --  166*  BUN 14   < > '13 11 14 14  ' --  12  CREATININE 0.76   < > 0.74 0.73 0.79 0.77  --  0.83  CALCIUM 8.7*   < > 8.8* 8.7* 8.7* 8.9  --  8.8*  MG 1.7  --   --  2.0  --   --  1.9 1.8  PHOS  --   --   --   --   --   --  3.6 3.8   < > = values in this interval not displayed.   GFR: Estimated Creatinine Clearance: 126.2 mL/min (by C-G formula based on SCr of 0.83 mg/dL). Liver Function Tests: Recent Labs  Lab 12/16/19 0302  AST 19  ALT 11  ALKPHOS 105  BILITOT 1.0  PROT 7.1  ALBUMIN 3.4*   No results for input(s): LIPASE, AMYLASE in the last 168 hours. No results for input(s): AMMONIA in the last 168 hours. Coagulation Profile: No results for input(s): INR, PROTIME in the last 168 hours. Cardiac Enzymes: No results for input(s): CKTOTAL, CKMB, CKMBINDEX, TROPONINI in the last 168 hours. BNP (last 3 results) No results for input(s): PROBNP in the last 8760 hours. HbA1C: No results for input(s): HGBA1C in the last 72 hours. CBG: Recent Labs  Lab  12/15/19 0629 12/15/19 1155 12/15/19 1630 12/15/19 2110 12/16/19 0623  GLUCAP 173* 173* 302* 242* 162*   Lipid Profile: Recent Labs    12/16/19 0302  CHOL 86  HDL 30*  LDLCALC 45  TRIG 57  CHOLHDL 2.9   Thyroid Function Tests: No results for input(s): TSH, T4TOTAL, FREET4, T3FREE, THYROIDAB in the last 72 hours. Anemia Panel: Recent Labs    12/15/19 1633  VITAMINB12 235  FOLATE 11.3  FERRITIN 32  TIBC 448  IRON 38  RETICCTPCT 2.1   Sepsis Labs: No results for input(s): PROCALCITON, LATICACIDVEN in the last 168 hours.  Recent Results (from the past 240 hour(s))  SARS CORONAVIRUS 2 (TAT 6-24 HRS) Nasopharyngeal Nasopharyngeal Swab     Status: None   Collection Time: 12/09/19  1:52 AM   Specimen: Nasopharyngeal Swab  Result Value Ref Range Status   SARS Coronavirus 2 NEGATIVE NEGATIVE Final    Comment: (NOTE) SARS-CoV-2 target nucleic acids are NOT DETECTED. The SARS-CoV-2 RNA is generally detectable in upper and lower respiratory specimens during the acute phase of infection. Negative results do not preclude SARS-CoV-2 infection, do not rule out co-infections with other pathogens, and should not be used as the sole basis for treatment or other patient management decisions. Negative results must be combined with clinical observations, patient history, and epidemiological information. The expected result is Negative. Fact Sheet for Patients: SugarRoll.be Fact Sheet for Healthcare Providers: https://www.-mathews.com/ This test is not yet approved or cleared by the [place] FDA and  has been authorized for detection and/or diagnosis of SARS-CoV-2 by FDA under an Emergency Use Authorization (EUA). This EUA will remain  in effect (meaning this test can be used) for the duration of the COVID-19 declaration under Section 56 4(b)(1) of the Act, 21 U.S.C. section 360bbb-3(b)(1), unless the authorization is terminated  or revoked sooner. Performed at [facility], [place] [address] [place], [place] [address]          Radiology Studies: No results found.      Scheduled Meds: . amLODipine  10 mg Oral Daily  . atorvastatin  40 mg Oral q1800  . carvedilol  25 mg Oral BID WC  . dextromethorphan-guaiFENesin  1 tablet Oral BID  . escitalopram  10 mg Oral Daily  . furosemide  60 mg Intravenous TID  . insulin aspart  0-15 Units Subcutaneous TID WC  . insulin detemir  14 Units Subcutaneous Daily  . oxybutynin  15 mg Oral QHS  . risperidone  4 mg Oral Daily  . rivaroxaban  20 mg Oral Q supper  . sodium chloride flush  3 mL Intravenous Once  . sodium chloride flush  3 mL Intravenous Q12H   Continuous Infusions: . sodium chloride       LOS: 7 days    Time spent:40 min    Kaige Whistler, Geraldo Docker, MD [facility] Pager [phone]  If 7PM-7AM, please contact night-coverage [email or web address] Password [facility] 12/16/2019, 11:33 AM

## 2019-12-17 LAB — CBC
HCT: 34.5 % — ABNORMAL LOW (ref 36.0–46.0)
Hemoglobin: 10.6 g/dL — ABNORMAL LOW (ref 12.0–15.0)
MCH: 26.2 pg (ref 26.0–34.0)
MCHC: 30.7 g/dL (ref 30.0–36.0)
MCV: 85.4 fL (ref 80.0–100.0)
Platelets: 221 10*3/uL (ref 150–400)
RBC: 4.04 MIL/uL (ref 3.87–5.11)
RDW: 15.3 % (ref 11.5–15.5)
WBC: 4.5 10*3/uL (ref 4.0–10.5)
nRBC: 0 % (ref 0.0–0.2)

## 2019-12-17 LAB — GLUCOSE, CAPILLARY
Glucose-Capillary: 114 mg/dL — ABNORMAL HIGH (ref 70–99)
Glucose-Capillary: 179 mg/dL — ABNORMAL HIGH (ref 70–99)
Glucose-Capillary: 198 mg/dL — ABNORMAL HIGH (ref 70–99)
Glucose-Capillary: 229 mg/dL — ABNORMAL HIGH (ref 70–99)

## 2019-12-17 LAB — COMPREHENSIVE METABOLIC PANEL
ALT: 11 U/L (ref 0–44)
AST: 18 U/L (ref 15–41)
Albumin: 3.2 g/dL — ABNORMAL LOW (ref 3.5–5.0)
Alkaline Phosphatase: 101 U/L (ref 38–126)
Anion gap: 11 (ref 5–15)
BUN: 9 mg/dL (ref 6–20)
CO2: 28 mmol/L (ref 22–32)
Calcium: 9 mg/dL (ref 8.9–10.3)
Chloride: 101 mmol/L (ref 98–111)
Creatinine, Ser: 0.73 mg/dL (ref 0.44–1.00)
GFR calc Af Amer: >60 mL/min (ref >60)
GFR calc non Af Amer: >60 mL/min (ref >60)
Glucose, Bld: 134 mg/dL — ABNORMAL HIGH (ref 70–99)
Potassium: 3.4 mmol/L — ABNORMAL LOW (ref 3.5–5.1)
Sodium: 140 mmol/L (ref 135–145)
Total Bilirubin: 1 mg/dL (ref 0.3–1.2)
Total Protein: 7.1 g/dL (ref 6.5–8.1)

## 2019-12-17 LAB — PHOSPHORUS: Phosphorus: 3.6 mg/dL (ref 2.5–4.6)

## 2019-12-17 LAB — MAGNESIUM: Magnesium: 2.1 mg/dL (ref 1.7–2.4)

## 2019-12-17 MED ORDER — POTASSIUM CHLORIDE CRYS ER 20 MEQ PO TBCR: 60.0000 meq | EXTENDED_RELEASE_TABLET | Freq: Once | ORAL | Status: DC

## 2019-12-17 MED ORDER — FUROSEMIDE 10 MG/ML IJ SOLN: 80.0000 mg | Freq: Two times a day (BID) | INTRAMUSCULAR | Status: DC

## 2019-12-17 MED ORDER — FUROSEMIDE 10 MG/ML IJ SOLN
20.0000 mg | Freq: Once | INTRAMUSCULAR | Status: AC
  Administered 2019-12-17: 20 mg via INTRAVENOUS
  Filled 2019-12-17: qty 2

## 2019-12-17 MED ORDER — POTASSIUM CHLORIDE CRYS ER 20 MEQ PO TBCR
60.0000 meq | EXTENDED_RELEASE_TABLET | Freq: Two times a day (BID) | ORAL | Status: AC
  Administered 2019-12-17 (×2): 60 meq via ORAL
  Filled 2019-12-17 (×2): qty 3

## 2019-12-17 MED ORDER — FUROSEMIDE 10 MG/ML IJ SOLN
80.0000 mg | Freq: Three times a day (TID) | INTRAMUSCULAR | Status: DC
  Administered 2019-12-17 – 2019-12-19 (×6): 80 mg via INTRAVENOUS
  Filled 2019-12-17 (×6): qty 8

## 2019-12-17 MED ORDER — METFORMIN HCL 500 MG PO TABS
1000.0000 mg | ORAL_TABLET | Freq: Two times a day (BID) | ORAL | Status: DC
  Administered 2019-12-17 – 2019-12-19 (×4): 1000 mg via ORAL
  Filled 2019-12-17 (×4): qty 2

## 2019-12-17 NOTE — Progress Notes (Signed)
SATURATION QUALIFICATIONS: (This note is used to comply with regulatory documentation for home oxygen)  Patient Saturations on Room Air at Rest = 96%  Patient Saturations on Room Air while Ambulating = 94%    Please briefly explain why patient needs home oxygen: patient does not need home O2.

## 2019-12-17 NOTE — Progress Notes (Signed)
PROGRESS NOTE    [name]  [id number] DOB: August 16, 1966 DOA: 12/08/2019 PCP: [name], NP     Brief Narrative:  54 year old BF PMHx Chronic diastolic CHF, HTN, Atrial Fibrillation, Pulmonary HTN, DM type 2 uncontrolled with complication, OSA, Morbid obesity class 3.   Presented with chest pain, dyspnea lower extremity edema. She does have significant past medical history for   Patient reported 1 week of progressive and worsening dyspnea, associated with productive cough, orthopnea, PND, increased abdominal girth and lower extremity edema. She gained 15 pounds in the last 4 weeks. 24 hours prior to hospitalization she developed substernal left-sided chest pressure, which was persistent. On her initial physical examination blood pressure 149/86 heart rate 95, respiratory rate23,oxygen saturation 95% her lungs had rales bilaterally, positive JVD, heart S1-S2, present rhythmic, abdomen distended, positive lower extremity edema. Sodium 142, potassium 3.7, chloride 107, bicarb 24, glucose 204, BUN 10, creatinine 0.72, BNP 391, troponin I 94, white count 5.5, hemoglobin 10.6, hematocrit 34.9, platelets 231.SARS COVID-19 negative. Chest radiograph with cardiomegaly, bilateral interstitial infiltrates with vascular congestion. EKG had 111 bpm, normal axis, no QRS prolongation,atrial fibrillation rhythm, poor R wave progression, no ST segment or T wave changes, low voltage.  Patient was admitted to the telemetry ward, and started with aggressive diuresis with intravenous furosemide.  Patient is responding well to diuresis with IV furosemide, now transitioned to oral torsemide.    Subjective: 3/21 A/O x4, negative CP, negative S OB.  States wearing CPAP at night.   Assessment & Plan:   Principal Problem:   Acute exacerbation of CHF (congestive heart failure) (HCC) Active Problems:   Morbid obesity ([place])   HTN (hypertension)   Type 2 diabetes mellitus (HCC)  Bipolar 1 disorder, mixed, moderate (HCC)   Chest pain   A-fib (HCC)   Class 3 obesity   Obesity hypoventilation syndrome (HCC)   Depression   Acute on chronic diastolic CHF decompensated/pulmonary HTN (unknown based weight) -Strict in and out -11.7 L -Daily weight Filed Weights   12/15/19 0445 12/16/19 0535 12/17/19 0616  Weight: (!) 159.1 kg (!) 156.9 kg (!) 154.5 kg  -Amlodipine 10 mg daily (with heavy diuresis may need to decrease) -Coreg 25 mg BID -3/21 increase Lasix IV 80 mg  TID  -With no base weight will restart Lasix IV and continue until creatinine begins to trend up. -Ambulate q shift -Transfuse for hemoglobin< 8 -3/21 nearing base weight.  Will obtain follow-up cardiology appointment in the a.m. most likely discharge.  OSA/OHS  -CPAP per respiratory -3/21 ambulatory SPO2 pending  Essential HTN  -See CHF  Chronic atrial fibrillation -3/19 currently NSR -See CHF -Continue Rivaroxaban 20 mg daily  Normocytic Anemia Recent Labs  Lab 12/15/19 0929 12/16/19 0302 12/17/19 0435  HGB 9.9* 10.6* 10.6*  -Stable -Anemia panel consistent with normocytic anemia -Occult blood pending  DM type II uncontrolled with complication -2/9 hemoglobin A1c= 7.6 -Meets guidelines for bariatric surgery; diabetic nutrition.  Bariatric surgery consult.  See morbid obesity  HLD -Lipitor 40 mg daily -3/20 LDL= 45   Morbid obesity class 3 with BMI >40. -See DM type II uncontrolled -Levemir 14 units daily -3/20 start home dose Glucotrol XL 10 mg daily -3/21 start home Metformin 1000 mg BID -Continue moderate SSI -RD Leafy Kindle assessed patient and will refer patient to [facility] health nutrition and diabetes education service for further support upon discharge.  Bipolar 1 disorder mixed moderate/depression -Risperidone  -Escitalopram.  Hypokalemia -Potassium goal> 4 -3/21 K-Dur 60 mEq x  2  Hypomagnesmia -Magnesium goal> 2     DVT prophylaxis:  Rivaroxaban code Status: Full Family Communication:  Disposition Plan: TBD 1.  Where the patient is from 2.  Anticipated d/c place. 3.  Barriers to d/c expected discharge 3/22    Consultants:    Procedures/Significant Events:    I have personally reviewed and interpreted all radiology studies and my findings are as above.  VENTILATOR SETTINGS:    Cultures   Antimicrobials: Anti-infectives (From admission, onward)   None       Devices    LINES / TUBES:      Continuous Infusions: . sodium chloride       Objective: Vitals:   12/16/19 1338 12/16/19 2034 12/16/19 2200 12/17/19 0616  BP: 139/83 (!) 139/97  (!) 141/88  Pulse: 74 73  70  Resp:  18  18  Temp: 97.9 F (36.6 C) 97.8 F (36.6 C)  98 F (36.7 C)  TempSrc: Oral Oral    SpO2: (!) 89% 99% 95% 96%  Weight:    (!) 154.5 kg  Height:        Intake/Output Summary (Last 24 hours) at 12/17/2019 0859 Last data filed at 12/17/2019 0500 Gross per 24 hour  Intake 1200 ml  Output 1250 ml  Net -50 ml   Filed Weights   12/15/19 0445 12/16/19 0535 12/17/19 0616  Weight: (!) 159.1 kg (!) 156.9 kg (!) 154.5 kg   Physical Exam:  General: A/O x4, no acute respiratory distress Eyes: negative scleral hemorrhage, negative anisocoria, negative icterus ENT: Negative Runny nose, negative gingival bleeding, Neck:  Negative scars, masses, torticollis, lymphadenopathy, JVD Lungs: Clear to auscultation bilaterally without wheezes or crackles Cardiovascular: Regular rate and rhythm without murmur gallop or rub normal S1 and S2 Abdomen: MORBIDLY OBESE, negative abdominal pain, nondistended, positive soft, bowel sounds, no rebound, no ascites, no appreciable mass Extremities: No significant cyanosis, clubbing.  1+ bilateral lower extremity edema,  Skin: Negative rashes, lesions, ulcers Psychiatric:  Negative depression, negative anxiety, negative fatigue, negative mania EXTREMELY POOR understanding of all multiple  medical conditions Central nervous system:  Cranial nerves II through XII intact, tongue/uvula midline, all extremities muscle strength 5/5, sensation intact throughout, negative dysarthria, negative expressive aphasia, negative receptive aphasia. .     Data Reviewed: Care during the described time interval was provided by me .  I have reviewed this patient's available data, including medical history, events of note, physical examination, and all test results as part of my evaluation.  CBC: Recent Labs  Lab 12/15/19 0929 12/16/19 0302 12/17/19 0435  WBC 5.0 4.8 4.5  NEUTROABS 3.3  --   --   HGB 9.9* 10.6* 10.6*  HCT 32.5* 34.9* 34.5*  MCV 86.4 86.6 85.4  PLT 203 223 [id number]   Basic Metabolic Panel: Recent Labs  Lab 12/11/19 0541 12/12/19 0317 12/13/19 0526 12/14/19 0517 12/15/19 0354 12/15/19 0929 12/16/19 0302 12/17/19 0435  NA 137   < > 142 140 142  --  139 140  K 4.1   < > 3.7 3.5 3.7  --  3.4* 3.4*  CL 100   < > 102 101 103  --  100 101  CO2 25   < > 28 30 28   --  29 28  GLUCOSE 231*   < > 148* 186* 165*  --  166* 134*  BUN 14   < > 11 14 14   --  12 9  CREATININE 0.76   < > 0.73 0.79 0.77  --  0.83 0.73  CALCIUM 8.7*   < > 8.7* 8.7* 8.9  --  8.8* 9.0  MG 1.7  --  2.0  --   --  1.9 1.8 2.1  PHOS  --   --   --   --   --  3.6 3.8 3.6   < > = values in this interval not displayed.   GFR: Estimated Creatinine Clearance: 129.8 mL/min (by C-G formula based on SCr of 0.73 mg/dL). Liver Function Tests: Recent Labs  Lab 12/16/19 0302 12/17/19 0435  AST 19 18  ALT 11 11  ALKPHOS 105 101  BILITOT 1.0 1.0  PROT 7.1 7.1  ALBUMIN 3.4* 3.2*   No results for input(s): LIPASE, AMYLASE in the last 168 hours. No results for input(s): AMMONIA in the last 168 hours. Coagulation Profile: No results for input(s): INR, PROTIME in the last 168 hours. Cardiac Enzymes: No results for input(s): CKTOTAL, CKMB, CKMBINDEX, TROPONINI in the last 168 hours. BNP (last 3 results) No  results for input(s): PROBNP in the last 8760 hours. HbA1C: No results for input(s): HGBA1C in the last 72 hours. CBG: Recent Labs  Lab 12/16/19 0623 12/16/19 1136 12/16/19 1643 12/16/19 2107 12/17/19 0616  GLUCAP 162* 170* 236* 192* 114*   Lipid Profile: Recent Labs    12/16/19 0302  CHOL 86  HDL 30*  LDLCALC 45  TRIG 57  CHOLHDL 2.9   Thyroid Function Tests: No results for input(s): TSH, T4TOTAL, FREET4, T3FREE, THYROIDAB in the last 72 hours. Anemia Panel: Recent Labs    12/15/19 1633  VITAMINB12 235  FOLATE 11.3  FERRITIN 32  TIBC 448  IRON 38  RETICCTPCT 2.1   Sepsis Labs: No results for input(s): PROCALCITON, LATICACIDVEN in the last 168 hours.  Recent Results (from the past 240 hour(s))  SARS CORONAVIRUS 2 (TAT 6-24 HRS) Nasopharyngeal Nasopharyngeal Swab     Status: None   Collection Time: 12/09/19  1:52 AM   Specimen: Nasopharyngeal Swab  Result Value Ref Range Status   SARS Coronavirus 2 NEGATIVE NEGATIVE Final    Comment: (NOTE) SARS-CoV-2 target nucleic acids are NOT DETECTED. The SARS-CoV-2 RNA is generally detectable in upper and lower respiratory specimens during the acute phase of infection. Negative results do not preclude SARS-CoV-2 infection, do not rule out co-infections with other pathogens, and should not be used as the sole basis for treatment or other patient management decisions. Negative results must be combined with clinical observations, patient history, and epidemiological information. The expected result is Negative. Fact Sheet for Patients: SugarRoll.be Fact Sheet for Healthcare Providers: https://www.-mathews.com/ This test is not yet approved or cleared by the [place] FDA and  has been authorized for detection and/or diagnosis of SARS-CoV-2 by FDA under an Emergency Use Authorization (EUA). This EUA will remain  in effect (meaning this test can be used) for the duration of  the COVID-19 declaration under Section 56 4(b)(1) of the Act, 21 U.S.C. section 360bbb-3(b)(1), unless the authorization is terminated or revoked sooner. Performed at [facility], [place] [address] [place], [place] [address]          Radiology Studies: No results found.      Scheduled Meds: . amLODipine  10 mg Oral Daily  . atorvastatin  40 mg Oral q1800  . carvedilol  25 mg Oral BID WC  . dextromethorphan-guaiFENesin  1 tablet Oral BID  . escitalopram  10 mg Oral Daily  . furosemide  60 mg Intravenous BID  . glipiZIDE  10 mg Oral QPM  . insulin aspart  0-15 Units Subcutaneous TID WC  . insulin detemir  14 Units Subcutaneous Daily  . oxybutynin  15 mg Oral QHS  . risperidone  4 mg Oral Daily  . rivaroxaban  20 mg Oral Q supper  . sodium chloride flush  3 mL Intravenous Once  . sodium chloride flush  3 mL Intravenous Q12H   Continuous Infusions: . sodium chloride       LOS: 8 days    Time spent:40 min    Tish Begin, Geraldo Docker, MD [facility] Pager [phone]  If 7PM-7AM, please contact night-coverage [email or web address] Password [facility] 12/17/2019, 8:59 AM

## 2019-12-17 NOTE — Progress Notes (Signed)
Occupational Therapy Treatment Patient Details Name: [name] MRN: [id number] DOB: 12/20/1965 Today's Date: 12/17/2019    History of present illness 54 y.o. female with medical history significant of chronic diastolic congestive heart failure, hypertension, persistent atrial fibrillation, pulmonary hypertension, SVT, type 2 diabetes, morbid obesity (BMI 52), OSA, bipolar disorder presented to ED with complaints of chest pain, shortness of breath, and lower extremity edema.   OT comments  Pt. Seen for skilled OT treatment session.  Pt. Able to completed bed mobility, in room ambulation to/from b.room and LB dressing eob with min guard A.  Cues for slowing pace and allowing for rest breaks.  Energy conservation handouts also provided and reviewed with pt. For integration and use during ADLs.  Pt. Verbalized understanding.  Continue with ADLs next session with focus on carryover of energy conservation and safety strategies during task completion.    Follow Up Recommendations  No OT follow up;Supervision - Intermittent    Equipment Recommendations  Tub/shower seat    Recommendations for Other Services      Precautions / Restrictions Precautions Precautions: Fall Precaution Comments: watch SpO2 Restrictions Weight Bearing Restrictions: No       Mobility Bed Mobility Overal bed mobility: Needs Assistance Bed Mobility: Sidelying to Sit;Rolling Rolling: Supervision Sidelying to sit: Supervision          Transfers Overall transfer level: Needs assistance Equipment used: None Transfers: Sit to/from Stand;Stand Pivot Transfers Sit to Stand: Min guard Stand pivot transfers: Min guard            Balance                                           ADL either performed or assessed with clinical judgement   ADL Overall ADL's : Needs assistance/impaired     Grooming: Wash/dry hands;Min guard;Standing Grooming Details (indicate cue type and reason):  simulated during in room ambulation to b.room             Lower Body Dressing: Set up;Sitting/lateral leans Lower Body Dressing Details (indicate cue type and reason): able to turn sideways seated edge of bed in each direction to pull L/R le into bed to reach and don socks safely vs. reaching forward seated eob. Toilet Transfer: Min guard;Ambulation;Grab Information systems manager Details (indicate cue type and reason): intermittent furniture walking to/from b.room Toileting- Clothing Manipulation and Hygiene: Supervision/safety;Sitting/lateral lean       Functional mobility during ADLs: Min guard General ADL Comments: cues and education provided for checking for dizziness with any change of positioning.  pt. able to integrate after approx. 3x reminding.  eduated on slowing pace during functional tasks and integreation of rest breaks.  cues to take a break after donning sock before starting the 2nd sock.  education provided on ways to maintain health ie: reviewed benefits of daily weights, monitoring edema, and any change in breathing patterns. reviewed contacting her md when she notes changes. after education provided was able to repeat back at end of session the tips provided.  energy conservation handouts also provided for pt. to review along with examples.  pt. reports she lives with her mother who perfroms most of the daily chores including cooking and cleaning.     Vision       Perception     Praxis      Cognition Arousal/Alertness: Awake/alert Behavior During Therapy: [facility] for  tasks assessed/performed Overall Cognitive Status: No family/caregiver present to determine baseline cognitive functioning                                          Exercises     Shoulder Instructions       General Comments      Pertinent Vitals/ Pain       Pain Assessment: No/denies pain  Home Living                                          Prior  Functioning/Environment              Frequency  Min 2X/week        Progress Toward Goals  OT Goals(current goals can now be found in the care plan section)  Progress towards OT goals: Progressing toward goals     Plan Discharge plan remains appropriate    Co-evaluation                 AM-PAC OT "6 Clicks" Daily Activity     Outcome Measure   Help from another person eating meals?: None Help from another person taking care of personal grooming?: A Little Help from another person toileting, which includes using toliet, bedpan, or urinal?: A Little Help from another person bathing (including washing, rinsing, drying)?: A Lot Help from another person to put on and taking off regular upper body clothing?: None Help from another person to put on and taking off regular lower body clothing?: A Lot 6 Click Score: 18    End of Session Equipment Utilized During Treatment: Gait belt  OT Visit Diagnosis: Other abnormalities of gait and mobility (R26.89)   Activity Tolerance Patient tolerated treatment well   Patient Left in chair;with call bell/phone within reach   Nurse Communication          Time: [id number] OT Time Calculation (min): 17 min  Charges: OT General Charges $OT Visit: 1 Visit OT Treatments $Self Care/Home Management : 8-22 mins  Sonia Baller, COTA/L Acute Rehabilitation [phone]   Janice Coffin 12/17/2019, 11:12 AM

## 2019-12-18 LAB — COMPREHENSIVE METABOLIC PANEL
ALT: 11 U/L (ref 0–44)
AST: 17 U/L (ref 15–41)
Albumin: 3.3 g/dL — ABNORMAL LOW (ref 3.5–5.0)
Alkaline Phosphatase: 108 U/L (ref 38–126)
Anion gap: 9 (ref 5–15)
BUN: 13 mg/dL (ref 6–20)
CO2: 30 mmol/L (ref 22–32)
Calcium: 9.2 mg/dL (ref 8.9–10.3)
Chloride: 103 mmol/L (ref 98–111)
Creatinine, Ser: 0.87 mg/dL (ref 0.44–1.00)
GFR calc Af Amer: >60 mL/min (ref >60)
GFR calc non Af Amer: >60 mL/min (ref >60)
Glucose, Bld: 119 mg/dL — ABNORMAL HIGH (ref 70–99)
Potassium: 3.9 mmol/L (ref 3.5–5.1)
Sodium: 142 mmol/L (ref 135–145)
Total Bilirubin: 0.9 mg/dL (ref 0.3–1.2)
Total Protein: 7.2 g/dL (ref 6.5–8.1)

## 2019-12-18 LAB — PHOSPHORUS: Phosphorus: 4.1 mg/dL (ref 2.5–4.6)

## 2019-12-18 LAB — GLUCOSE, CAPILLARY
Glucose-Capillary: 107 mg/dL — ABNORMAL HIGH (ref 70–99)
Glucose-Capillary: 144 mg/dL — ABNORMAL HIGH (ref 70–99)
Glucose-Capillary: 304 mg/dL — ABNORMAL HIGH (ref 70–99)
Glucose-Capillary: 247 mg/dL — ABNORMAL HIGH (ref 70–99)

## 2019-12-18 LAB — CBC
HCT: 35.5 % — ABNORMAL LOW (ref 36.0–46.0)
Hemoglobin: 10.8 g/dL — ABNORMAL LOW (ref 12.0–15.0)
MCH: 26.3 pg (ref 26.0–34.0)
MCHC: 30.4 g/dL (ref 30.0–36.0)
MCV: 86.6 fL (ref 80.0–100.0)
Platelets: 213 10*3/uL (ref 150–400)
RBC: 4.1 MIL/uL (ref 3.87–5.11)
RDW: 15.4 % (ref 11.5–15.5)
WBC: 4.4 10*3/uL (ref 4.0–10.5)
nRBC: 0 % (ref 0.0–0.2)

## 2019-12-18 LAB — MAGNESIUM: Magnesium: 1.9 mg/dL (ref 1.7–2.4)

## 2019-12-18 MED ORDER — ALBUTEROL SULFATE (2.5 MG/3ML) 0.083% IN NEBU
2.5000 mg | INHALATION_SOLUTION | RESPIRATORY_TRACT | Status: DC | PRN
  Administered 2019-12-18: 2.5 mg via RESPIRATORY_TRACT
  Filled 2019-12-18: qty 3

## 2019-12-18 MED ORDER — INSULIN DETEMIR 100 UNIT/ML ~~LOC~~ SOLN
20.0000 [IU] | Freq: Every day | SUBCUTANEOUS | Status: DC
  Administered 2019-12-19: 20 [IU] via SUBCUTANEOUS
  Filled 2019-12-18: qty 0.2

## 2019-12-18 MED ORDER — ALBUTEROL SULFATE HFA 108 (90 BASE) MCG/ACT IN AERS: 2.0000 | INHALATION_SPRAY | RESPIRATORY_TRACT | Status: DC | PRN

## 2019-12-18 NOTE — Procedures (Signed)
Patient declined CPAP for the night  

## 2019-12-18 NOTE — Progress Notes (Signed)
PROGRESS NOTE    [name]  [id number] DOB: July 10, 1966 DOA: 12/08/2019 PCP: [name], NP     Brief Narrative:  54 year old BF PMHx Chronic diastolic CHF, HTN, Atrial Fibrillation, Pulmonary HTN, DM type 2 uncontrolled with complication, OSA, Morbid obesity class 3.   Presented with chest pain, dyspnea lower extremity edema. She does have significant past medical history for   Patient reported 1 week of progressive and worsening dyspnea, associated with productive cough, orthopnea, PND, increased abdominal girth and lower extremity edema. She gained 15 pounds in the last 4 weeks. 24 hours prior to hospitalization she developed substernal left-sided chest pressure, which was persistent. On her initial physical examination blood pressure 149/86 heart rate 95, respiratory rate23,oxygen saturation 95% her lungs had rales bilaterally, positive JVD, heart S1-S2, present rhythmic, abdomen distended, positive lower extremity edema. Sodium 142, potassium 3.7, chloride 107, bicarb 24, glucose 204, BUN 10, creatinine 0.72, BNP 391, troponin I 94, white count 5.5, hemoglobin 10.6, hematocrit 34.9, platelets 231.SARS COVID-19 negative. Chest radiograph with cardiomegaly, bilateral interstitial infiltrates with vascular congestion. EKG had 111 bpm, normal axis, no QRS prolongation,atrial fibrillation rhythm, poor R wave progression, no ST segment or T wave changes, low voltage.  Patient was admitted to the telemetry ward, and started with aggressive diuresis with intravenous furosemide.  Patient is responding well to diuresis with IV furosemide, now transitioned to oral torsemide.    Subjective: 3/22 A/O x4, negative CP, negative S OB.  States wearing CPAP at night    Assessment & Plan:   Principal Problem:   Acute exacerbation of CHF (congestive heart failure) (HCC) Active Problems:   Morbid obesity (HCC)   HTN (hypertension)   Type 2 diabetes mellitus (HCC)  Bipolar 1 disorder, mixed, moderate (HCC)   Chest pain   A-fib (HCC)   Class 3 obesity   Obesity hypoventilation syndrome (HCC)   Depression   Acute on chronic diastolic CHF decompensated/pulmonary HTN (unknown based weight) -Strict in and out -14.6 L -Daily weight Filed Weights   12/16/19 0535 12/17/19 0616 12/18/19 0613  Weight: (!) 156.9 kg (!) 154.5 kg (!) 154.3 kg   -Amlodipine 10 mg daily (with heavy diuresis may need to decrease) -Coreg 25 mg BID -3/21 increase Lasix IV 80 mg  TID  -With no base weight will restart Lasix IV and continue until creatinine begins to trend up. -Ambulate q shift -Transfuse for hemoglobin< 8 -3/22 schedule appointment with Dr. [name] cardiology in 2 weeks acute on chronic diastolic CHF/pulmonary HTN, OSA/OHS.  Requires sleep study  OSA/OHS  -CPAP per respiratory -3/21 ambulatory SPO2 pending  Essential HTN  -See CHF  Chronic atrial fibrillation -3/19 currently NSR -See CHF -Continue Rivaroxaban 20 mg daily  Normocytic Anemia Recent Labs  Lab 12/15/19 0929 12/16/19 0302 12/17/19 0435 12/18/19 0441  HGB 9.9* 10.6* 10.6* 10.8*  -Stable -Anemia panel consistent with normocytic anemia -Occult blood pending  DM type II uncontrolled with complication -2/9 hemoglobin A1c= 7.6 -Meets guidelines for bariatric surgery; diabetic nutrition.  Bariatric surgery consult.  See morbid obesity  HLD -Lipitor 40 mg daily -3/20 LDL= 45   Morbid obesity class 3 with BMI >40. -See DM type II uncontrolled -3/22 increase Levemir Levemir 20 units daily -3/20 start home dose Glucotrol XL 10 mg daily -3/21 start home Metformin 1000 mg BID -3/22 CBG q 4 hr -RD Leafy Kindle assessed patient and will refer patient to [facility] health nutrition and diabetes education service for further support upon discharge.  Bipolar 1  disorder mixed moderate/depression -Risperidone  -Escitalopram.  Hypokalemia -Potassium goal> 4 -3/21 K-Dur 60  mEq x 2  Hypomagnesmia -Magnesium goal> 2     DVT prophylaxis: Rivaroxaban code Status: Full Family Communication:  Disposition Plan: TBD 1.  Where the patient is from 2.  Anticipated d/c place. 3.  Barriers to d/c patient on IV diuretic expected  discharge 3/23    Consultants:    Procedures/Significant Events:    I have personally reviewed and interpreted all radiology studies and my findings are as above.  VENTILATOR SETTINGS:    Cultures   Antimicrobials: Anti-infectives (From admission, onward)   None       Devices    LINES / TUBES:      Continuous Infusions: . sodium chloride       Objective: Vitals:   12/17/19 1209 12/17/19 1800 12/17/19 2025 12/18/19 0613  BP: (!) 135/102  (!) 141/91 139/87  Pulse: 71  83 71  Resp: 18  18 20   Temp: 97.6 F (36.4 C)  97.9 F (36.6 C) 98.7 F (37.1 C)  TempSrc: Oral  Oral   SpO2: 90% 96% 100% 97%  Weight:    (!) 154.3 kg  Height:        Intake/Output Summary (Last 24 hours) at 12/18/2019 [id number] Last data filed at 12/18/2019 0745 Gross per 24 hour  Intake 2100 ml  Output 3100 ml  Net -1000 ml   Filed Weights   12/16/19 0535 12/17/19 0616 12/18/19 [id number]  Weight: (!) 156.9 kg (!) 154.5 kg (!) 154.3 kg   Physical Exam:  General: A/O x4, no acute respiratory distress Eyes: negative scleral hemorrhage, negative anisocoria, negative icterus ENT: Negative Runny nose, negative gingival bleeding, Neck:  Negative scars, masses, torticollis, lymphadenopathy, JVD Lungs: Clear to auscultation bilaterally without wheezes or crackles Cardiovascular: Regular rate and rhythm without murmur gallop or rub normal S1 and S2 Abdomen: MORBIDLY OBESE, negative abdominal pain, nondistended, positive soft, bowel sounds, no rebound, no ascites, no appreciable mass Extremities: No significant cyanosis, clubbing, or edema bilateral lower extremities Skin: Negative rashes, lesions, ulcers Psychiatric:  Negative depression,  negative anxiety, negative fatigue, negative mania, EXTREMELY POOR understanding of multiple medical conditions Central nervous system:  Cranial nerves II through XII intact, tongue/uvula midline, all extremities muscle strength 5/5, sensation intact throughout, negative dysarthria, negative expressive aphasia, negative receptive aphasia. .     Data Reviewed: Care during the described time interval was provided by me .  I have reviewed this patient's available data, including medical history, events of note, physical examination, and all test results as part of my evaluation.  CBC: Recent Labs  Lab 12/15/19 0929 12/16/19 0302 12/17/19 0435 12/18/19 0441  WBC 5.0 4.8 4.5 4.4  NEUTROABS 3.3  --   --   --   HGB 9.9* 10.6* 10.6* 10.8*  HCT 32.5* 34.9* 34.5* 35.5*  MCV 86.4 86.6 85.4 86.6  PLT 203 223 221 [id number]   Basic Metabolic Panel: Recent Labs  Lab 12/13/19 0526 12/13/19 0526 12/14/19 0517 12/15/19 0354 12/15/19 0929 12/16/19 0302 12/17/19 0435 12/18/19 0441  NA 142   < > 140 142  --  139 140 142  K 3.7   < > 3.5 3.7  --  3.4* 3.4* 3.9  CL 102   < > 101 103  --  100 101 103  CO2 28   < > 30 28  --  29 28 30   GLUCOSE 148*   < > 186* 165*  --  166*  134* 119*  BUN 11   < > 14 14  --  12 9 13   CREATININE 0.73   < > 0.79 0.77  --  0.83 0.73 0.87  CALCIUM 8.7*   < > 8.7* 8.9  --  8.8* 9.0 9.2  MG 2.0  --   --   --  1.9 1.8 2.1 1.9  PHOS  --   --   --   --  3.6 3.8 3.6 4.1   < > = values in this interval not displayed.   GFR: Estimated Creatinine Clearance: 119.2 mL/min (by C-G formula based on SCr of 0.87 mg/dL). Liver Function Tests: Recent Labs  Lab 12/16/19 0302 12/17/19 0435 12/18/19 0441  AST 19 18 17   ALT 11 11 11   ALKPHOS 105 101 108  BILITOT 1.0 1.0 0.9  PROT 7.1 7.1 7.2  ALBUMIN 3.4* 3.2* 3.3*   No results for input(s): LIPASE, AMYLASE in the last 168 hours. No results for input(s): AMMONIA in the last 168 hours. Coagulation Profile: No results for  input(s): INR, PROTIME in the last 168 hours. Cardiac Enzymes: No results for input(s): CKTOTAL, CKMB, CKMBINDEX, TROPONINI in the last 168 hours. BNP (last 3 results) No results for input(s): PROBNP in the last 8760 hours. HbA1C: No results for input(s): HGBA1C in the last 72 hours. CBG: Recent Labs  Lab 12/17/19 0616 12/17/19 1107 12/17/19 1630 12/17/19 2126 12/18/19 0610  GLUCAP 114* 179* 198* 229* 107*   Lipid Profile: Recent Labs    12/16/19 0302  CHOL 86  HDL 30*  LDLCALC 45  TRIG 57  CHOLHDL 2.9   Thyroid Function Tests: No results for input(s): TSH, T4TOTAL, FREET4, T3FREE, THYROIDAB in the last 72 hours. Anemia Panel: Recent Labs    12/15/19 1633  VITAMINB12 235  FOLATE 11.3  FERRITIN 32  TIBC 448  IRON 38  RETICCTPCT 2.1   Sepsis Labs: No results for input(s): PROCALCITON, LATICACIDVEN in the last 168 hours.  Recent Results (from the past 240 hour(s))  SARS CORONAVIRUS 2 (TAT 6-24 HRS) Nasopharyngeal Nasopharyngeal Swab     Status: None   Collection Time: 12/09/19  1:52 AM   Specimen: Nasopharyngeal Swab  Result Value Ref Range Status   SARS Coronavirus 2 NEGATIVE NEGATIVE Final    Comment: (NOTE) SARS-CoV-2 target nucleic acids are NOT DETECTED. The SARS-CoV-2 RNA is generally detectable in upper and lower respiratory specimens during the acute phase of infection. Negative results do not preclude SARS-CoV-2 infection, do not rule out co-infections with other pathogens, and should not be used as the sole basis for treatment or other patient management decisions. Negative results must be combined with clinical observations, patient history, and epidemiological information. The expected result is Negative. Fact Sheet for Patients: SugarRoll.be Fact Sheet for Healthcare Providers: https://www.-mathews.com/ This test is not yet approved or cleared by the [place] FDA and  has been authorized for  detection and/or diagnosis of SARS-CoV-2 by FDA under an Emergency Use Authorization (EUA). This EUA will remain  in effect (meaning this test can be used) for the duration of the COVID-19 declaration under Section 56 4(b)(1) of the Act, 21 U.S.C. section 360bbb-3(b)(1), unless the authorization is terminated or revoked sooner. Performed at [facility], [place] [address] [place], [place] [address]          Radiology Studies: No results found.      Scheduled Meds: . amLODipine  10 mg Oral Daily  . atorvastatin  40 mg Oral q1800  .  carvedilol  25 mg Oral BID WC  . dextromethorphan-guaiFENesin  1 tablet Oral BID  . escitalopram  10 mg Oral Daily  . furosemide  80 mg Intravenous TID  . glipiZIDE  10 mg Oral QPM  . insulin detemir  14 Units Subcutaneous Daily  . metFORMIN  1,000 mg Oral BID WC  . oxybutynin  15 mg Oral QHS  . risperidone  4 mg Oral Daily  . rivaroxaban  20 mg Oral Q supper  . sodium chloride flush  3 mL Intravenous Once  . sodium chloride flush  3 mL Intravenous Q12H   Continuous Infusions: . sodium chloride       LOS: 9 days    Time spent:40 min    Jacolby Risby, Geraldo Docker, MD [facility] Pager [phone]  If 7PM-7AM, please contact night-coverage [email or web address] Password [facility] 12/18/2019, 8:19 AM

## 2019-12-18 NOTE — Progress Notes (Signed)
Pt complaining of chest pain rated 8 of 10 for about 5 minutes then it resolved to 0  MD notified  EKG preformed  Pt is stable  Will continue to monitor

## 2019-12-19 DIAGNOSIS — I208 Other forms of angina pectoris: Secondary | ICD-10-CM

## 2019-12-19 LAB — COMPREHENSIVE METABOLIC PANEL
ALT: 9 U/L (ref 0–44)
AST: 19 U/L (ref 15–41)
Albumin: 3.3 g/dL — ABNORMAL LOW (ref 3.5–5.0)
Alkaline Phosphatase: 108 U/L (ref 38–126)
Anion gap: 9 (ref 5–15)
BUN: 12 mg/dL (ref 6–20)
CO2: 28 mmol/L (ref 22–32)
Calcium: 8.9 mg/dL (ref 8.9–10.3)
Chloride: 102 mmol/L (ref 98–111)
Creatinine, Ser: 0.76 mg/dL (ref 0.44–1.00)
GFR calc Af Amer: >60 mL/min (ref >60)
GFR calc non Af Amer: >60 mL/min (ref >60)
Glucose, Bld: 143 mg/dL — ABNORMAL HIGH (ref 70–99)
Potassium: 3.1 mmol/L — ABNORMAL LOW (ref 3.5–5.1)
Sodium: 139 mmol/L (ref 135–145)
Total Bilirubin: 1 mg/dL (ref 0.3–1.2)
Total Protein: 7.2 g/dL (ref 6.5–8.1)

## 2019-12-19 LAB — CBC
HCT: 35.9 % — ABNORMAL LOW (ref 36.0–46.0)
Hemoglobin: 11 g/dL — ABNORMAL LOW (ref 12.0–15.0)
MCH: 26.3 pg (ref 26.0–34.0)
MCHC: 30.6 g/dL (ref 30.0–36.0)
MCV: 85.7 fL (ref 80.0–100.0)
Platelets: 219 10*3/uL (ref 150–400)
RBC: 4.19 MIL/uL (ref 3.87–5.11)
RDW: 15.2 % (ref 11.5–15.5)
WBC: 4.4 10*3/uL (ref 4.0–10.5)
nRBC: 0 % (ref 0.0–0.2)

## 2019-12-19 LAB — MAGNESIUM: Magnesium: 1.6 mg/dL — ABNORMAL LOW (ref 1.7–2.4)

## 2019-12-19 LAB — GLUCOSE, CAPILLARY
Glucose-Capillary: 118 mg/dL — ABNORMAL HIGH (ref 70–99)
Glucose-Capillary: 129 mg/dL — ABNORMAL HIGH (ref 70–99)
Glucose-Capillary: 114 mg/dL — ABNORMAL HIGH (ref 70–99)

## 2019-12-19 LAB — PHOSPHORUS: Phosphorus: 4 mg/dL (ref 2.5–4.6)

## 2019-12-19 MED ORDER — POTASSIUM CHLORIDE 10 MEQ/100ML IV SOLN
10.0000 meq | INTRAVENOUS | Status: AC
  Administered 2019-12-19 (×4): 10 meq via INTRAVENOUS
  Filled 2019-12-19 (×4): qty 100

## 2019-12-19 MED ORDER — FUROSEMIDE 80 MG PO TABS: 80.0000 mg | ORAL_TABLET | Freq: Three times a day (TID) | ORAL | 0 refills | Status: DC

## 2019-12-19 MED ORDER — INSULIN PEN NEEDLE 29G X 12MM MISC: 0 refills | Status: DC

## 2019-12-19 MED ORDER — INSULIN DETEMIR 100 UNIT/ML FLEXPEN: 20.0000 [IU] | Freq: Every day | SUBCUTANEOUS | 0 refills | Status: DC

## 2019-12-19 MED ORDER — POTASSIUM CHLORIDE CRYS ER 20 MEQ PO TBCR
50.0000 meq | EXTENDED_RELEASE_TABLET | Freq: Once | ORAL | Status: AC
  Administered 2019-12-19: 50 meq via ORAL
  Filled 2019-12-19: qty 2

## 2019-12-19 MED ORDER — MAGNESIUM SULFATE 50 % IJ SOLN
3.0000 g | Freq: Once | INTRAVENOUS | Status: AC
  Administered 2019-12-19: 3 g via INTRAVENOUS
  Filled 2019-12-19: qty 6

## 2019-12-19 NOTE — Plan of Care (Signed)
  Problem: Education: Goal: Ability to demonstrate management of disease process will improve Outcome: Progressing   Problem: Cardiac: Goal: Ability to achieve and maintain adequate cardiopulmonary perfusion will improve Outcome: Progressing   Problem: Clinical Measurements: Goal: Cardiovascular complication will be avoided Outcome: Progressing

## 2019-12-19 NOTE — Discharge Summary (Signed)
Physician Discharge Summary  Jasmane Brockway MIW:[id number] DOB: January 17, 1966 DOA: 12/08/2019  PCP: [name], NP  Admit date: 12/08/2019 Discharge date: 12/19/2019  Time spent:40 minutes  Recommendations for Outpatient Follow-up: Acute on chronic diastolic CHF decompensated/pulmonary HTN (unknown based weight) -Strict in and out  -18.2 L -Daily weight Filed Weights   12/17/19 0616 12/18/19 0613 12/19/19 0438  Weight: (!) 154.5 kg (!) 154.3 kg (!) 149.8 kg  -Amlodipine 10 mg daily (with heavy diuresis may need to decrease) -Coreg 25 mg BID -Lasix p.o. 80 mg TID -Patient probably still has not reached base weight but given that she continues to consume water and beverages at night against RN requests probably never will.   -Transfuse for hemoglobin< 8 -3/22 schedule appointment with Dr. [name] cardiology in 2 weeks acute on chronic diastolic CHF/pulmonary HTN, OSA/OHS.  Requires sleep study  OSA/OHS  -CPAP per respiratory SATURATION QUALIFICATIONS: (This note is used to comply with regulatory documentation for home oxygen) Patient Saturations on Room Air at Rest = 96% Patient Saturations on Room Air while Ambulating = 94% Please briefly explain why patient needs home oxygen: patient does not need home O2.  -Patient does not meet criteria for home O2  Essential HTN  -See CHF  Chronic atrial fibrillation -3/19 currently NSR -See CHF -Continue Rivaroxaban 20 mg daily  Normocytic Anemia Recent Labs  Lab 12/15/19 0929 12/16/19 0302 12/17/19 0435 12/18/19 0441 12/19/19 0431  HGB 9.9* 10.6* 10.6* 10.8* 11.0*  -Stable -Anemia panel consistent with normocytic anemia  DM type II uncontrolled with complication -2/9 hemoglobin A1c= 7.6 -Meets guidelines for bariatric surgery; diabetic nutrition.  Bariatric surgery consult.  See morbid obesity -Follow-up with NP Geryl Rankins 15 April '@1430'  DM type II uncontrolled with complication, morbid obesity,  OSA/OHS  HLD -Lipitor 40 mg daily -3/20 LDL= 45   Morbid obesity class 3 with BMI >40. -See DM type II uncontrolled -3/22 increase Levemir Levemir 20 units daily -3/20 start home dose Glucotrol XL 10 mg daily -3/21 start home Metformin 1000 mg BID -3/22 CBG q 4 hr -RD Leafy Kindle assessed patient and will refer patient to [facility] health nutrition and diabetes education service for further support upon discharge.  Bipolar 1 disorder mixed moderate/depression -Risperidone  -Escitalopram.  Hypokalemia -Potassium goal> 4 -K-Dur 50 mEq + potassium IV 40 mEq prior to discharge  Hypomagnesmia -Magnesium goal> 2 -Magnesium IV 3 g prior to discharge    Discharge Diagnoses:  Principal Problem:   Acute exacerbation of CHF (congestive heart failure) ([place]) Active Problems:   Morbid obesity (HCC)   HTN (hypertension)   Type 2 diabetes mellitus (HCC)   Bipolar 1 disorder, mixed, moderate (HCC)   Chest pain   A-fib ([place])   Class 3 obesity   Obesity hypoventilation syndrome ([place])   Depression   Discharge Condition: Stable  Diet recommendation: Heart healthy/carb modified  Filed Weights   12/17/19 0616 12/18/19 0613 12/19/19 0438  Weight: (!) 154.5 kg (!) 154.3 kg (!) 149.8 kg    History of present illness:  54 year old BF PMHx Chronic diastolic CHF, HTN, Atrial Fibrillation, Pulmonary HTN, DM type 2 uncontrolled with complication, OSA, Morbid obesityclass 3.   Presented with chest pain, dyspnea lower extremity edema. She does have significant past medical history for   Patient reported 1 week of progressive and worsening dyspnea, associated with productive cough, orthopnea, PND, increased abdominal girth and lower extremity edema. She gained 15 pounds in the last 4 weeks. 24 hours prior to hospitalization she  developed substernal left-sided chest pressure, which was persistent.On her initial physical examination blood pressure 149/86 heart rate 95, respiratory  rate23,oxygen saturation 95% her lungs had rales bilaterally, positive JVD, heart S1-S2, present rhythmic, abdomen distended, positive lower extremity edema. Sodium 142, potassium 3.7, chloride 107, bicarb 24, glucose 204, BUN 10, creatinine 0.72, BNP 391, troponinI94, white count 5.5, hemoglobin 10.6, hematocrit 34.9, platelets 231.SARS COVID-19 negative. Chest radiograph with cardiomegaly, bilateral interstitial infiltrates with vascular congestion. EKG had 111 bpm, normal axis, no QRS prolongation,atrial fibrillation rhythm, poor R wave progression, no ST segment or T wave changes, low voltage.  Patient was admitted to the telemetry ward, and started with aggressive diuresis with intravenous furosemide.  Patient is responding well to diuresis withIVfurosemide, now transitioned to oral torsemide.  Hospital Course:  For acute on chronic diastolic CHF and pulmonary HTN.  Aggressively diuresed acute respiratory failure with hypoxia resolved.  Patiently aggressively consult on need to weigh yourself daily and maintain appropriate weight.  Patient also counseled on need for nightly use of CPAP given OSA/OHS.  Stable for discharge.  Procedures: 3/13 Echocardiogram;LVEF; 65 to 70%.-hyperdynamic function.-no regional wall motion abnormalities.  -Left ventricular diastolic function could not be evaluated due to atrial fibrillation.  Right Ventricle:-Moderately elevated pulmonary artery systolic pressure.  -Assumed right atrial pressure of 15 mmHg,-estimated right ventricular systolic pressure is 69.4 mmHg.   Cultures   3/13 SARS coronavirus negative 3/13 HIV negative    Discharge Exam: Vitals:   12/18/19 2250 12/19/19 0438 12/19/19 0443 12/19/19 0829  BP:   (!) 172/91 127/82  Pulse:   82 85  Resp:   20   Temp:   98.5 F (36.9 C) 98.8 F (37.1 C)  TempSrc:   Oral Oral  SpO2: 98%  97% 97%  Weight:  (!) 149.8 kg    Height:        General: A/O x4, no acute respiratory  distress Eyes: negative scleral hemorrhage, negative anisocoria, negative icterus ENT: Negative Runny nose, negative gingival bleeding, Neck:  Negative scars, masses, torticollis, lymphadenopathy, JVD Lungs: Clear to auscultation bilaterally without wheezes or crackles Cardiovascular: Regular rate and rhythm without murmur gallop or rub normal S1 and S2 Abdomen: MORBIDLY OBESE, negative abdominal pain, nondistended, positive soft, bowel sounds, no rebound, no ascites, no appreciable mass  Discharge Instructions  Discharge Instructions    Amb Referral to Nutrition and Diabetic E   Complete by: As directed      Allergies as of 12/19/2019      Reactions   Acetaminophen Other (See Comments)   Seizure-like "fits" as a child   Caffeine    Tense, anxiety, increased urination   [place] [dapagliflozin] Other (See Comments)   Hallucinations, drop in blood sugar   Lisinopril Rash   Rash with lisinopril; but fosinopril is ok per patient      Medication List    STOP taking these medications   losartan 100 MG tablet Commonly known as: COZAAR     TAKE these medications   Accu-Chek Aviva Plus test strip Generic drug: glucose blood USE AS DIRECTED TO TEST BLOOD SUGAR ONCE DAILY   Accu-Chek Softclix Lancets lancets USE AS DIRECTED TO TEST BLOOD SUGAR ONCE DAILY   albuterol 108 (90 Base) MCG/ACT inhaler Commonly known as: ProAir HFA INHALE 1 TO 2 PUFFS EVERY 6 HOURS AS NEEDED FOR WHEEZING/ SHORTNESS OF BREATH   amLODipine 10 MG tablet Commonly known as: NORVASC Take 10 mg by mouth daily.   atorvastatin 40 MG tablet Commonly known as:  LIPITOR Take 1 tablet (40 mg total) by mouth daily at 6 PM.   carvedilol 25 MG tablet Commonly known as: COREG Take 25 mg by mouth 2 (two) times daily with a meal.   escitalopram 10 MG tablet Commonly known as: Lexapro 1/2 ab PO daily x 2 wks then 1 tab PO daily What changed:   how much to take  how to take this  when to take  this  additional instructions   ferrous sulfate 325 (65 FE) MG tablet Commonly known as: FeroSul Take 1 tablet (325 mg total) by mouth 2 (two) times daily. What changed:   when to take this  additional instructions   furosemide 80 MG tablet Commonly known as: Lasix Take 1 tablet (80 mg total) by mouth in the morning, at noon, and at bedtime. What changed: when to take this   glipiZIDE 10 MG 24 hr tablet Commonly known as: GLUCOTROL XL Take 10 mg by mouth every evening.   insulin detemir 100 unit/ml Soln Commonly known as: LEVEMIR Inject 0.2 mLs (20 Units total) into the skin daily.   Insulin Pen Needle 29G X 12MM Misc Use per instructions   metFORMIN 1000 MG tablet Commonly known as: GLUCOPHAGE Take 1 tablet (1,000 mg total) by mouth 2 (two) times daily with a meal.   Misc. Devices Misc Please provide BiPAP machine with the following: Settings 22/18 cm H2O. Medium  size [employer] Full Face Mask Simplus mask and heated  humidification.   oxybutynin 15 MG 24 hr tablet Commonly known as: DITROPAN XL Take 1 tablet (15 mg total) by mouth at bedtime.   Potassium Chloride ER 20 MEQ Tbcr Take 20 mEq by mouth 2 (two) times daily.   risperidone 4 MG tablet Commonly known as: RISPERDAL Take 1 tablet (4 mg total) by mouth daily.   rivaroxaban 20 MG Tabs tablet Commonly known as: Xarelto TAKE 1 TABLET (20 MG TOTAL) BY MOUTH DAILY WITH SUPPER.   True Metrix Meter w/Device Kit Use as instructed. Check blood glucose levels by fingerstick twice per day   Accu-Chek Aviva Plus w/Device Kit 1 each by Does not apply route daily. USE AS DIRECTED TO TEST BLOOD SUGAR ONCE DAILY            Durable Medical Equipment  (From admission, onward)         Start     Ordered   12/11/19 1601  For home use only DME Shower stool  Once     12/11/19 1600         Allergies  Allergen Reactions  . Acetaminophen Other (See Comments)    Seizure-like "fits" as a child  .  Caffeine     Tense, anxiety, increased urination  . [place] [Dapagliflozin] Other (See Comments)    Hallucinations, drop in blood sugar  . Lisinopril Rash    Rash with lisinopril; but fosinopril is ok per patient   [facility] Follow up.   Why: shower chair Contact information: [address] [place] [address] [phone]        [name], NP. Go on 01/11/2020.   Specialty: Nurse Practitioner Why: '@2' :30pm Contact information: [place] [address] [phone]        Lelon Perla, MD. Schedule an appointment as soon as possible for a visit in 2 week(s).   Specialty: Cardiology Why: 3/22 schedule appointment with Dr. [name] cardiology in 2 weeks acute  on chronic diastolic CHF/pulmonary HTN, OSA/OHS.  Requires sleep study Contact information: [address] [place] [address] [phone]        Lelon Perla, MD. Go on 01/02/2020.   Specialty: Cardiology Why: '@8' :15am Contact information: [place] [address] [phone]            The results of significant diagnostics from this hospitalization (including imaging, microbiology, ancillary and laboratory) are listed below for reference.    Significant Diagnostic Studies: DG Chest 1 View  Result Date: 12/08/2019 CLINICAL DATA:  Cough short of breath and chest pain EXAM: CHEST  1 VIEW COMPARISON:  04/09/2019 FINDINGS: Cardiomegaly with vascular congestion. Mild diffuse interstitial opacity. No pleural effusion. No consolidation. No pneumothorax. IMPRESSION: Cardiomegaly with vascular congestion and mild diffuse interstitial opacity, probably representing mild edema. Electronically Signed   By: Donavan Foil M.D.   On: 12/08/2019 22:54   ECHOCARDIOGRAM COMPLETE  Result Date: 12/09/2019    ECHOCARDIOGRAM REPORT   Patient Name:   [name] Date of Exam: 12/09/2019 Medical Rec #:  [id number]      Height:       67.0 in Accession #:    [id number]    Weight:       350.0 lb Date of Birth:  04-Aug-1966     BSA:          2.565 m Patient Age:    38 years      BP:           129/78 mmHg Patient Gender: F             HR:           85 bpm. Exam Location:  Inpatient Procedure: 2D Echo Indications:    acute diastolic chf 194.17  History:        Patient has prior history of Echocardiogram examinations, most                 recent 07/24/2019. CHF, Arrythmias:Atrial Fibrillation; Risk                 Factors:Sleep Apnea, Hypertension and Diabetes.  Sonographer:    Johny Chess Referring Phys: [id number] [place]  Sonographer Comments: Patient is morbidly obese. Image acquisition challenging due to patient body habitus. IMPRESSIONS  1. Left ventricular ejection fraction, by estimation, is 65 to 70%. The left ventricle has hyperdynamic function. The left ventricle has no regional wall motion abnormalities. Left ventricular diastolic function could not be evaluated. Left ventricular diastolic function could not be evaluated.  2. Right ventricular systolic function is normal. The right ventricular size is normal. There is moderately elevated pulmonary artery systolic pressure. The estimated right ventricular systolic pressure is 18.5 mmHg.  3. Left atrial size was mildly dilated.  4. Right atrial size was mildly dilated.  5. The mitral valve is normal in structure. Mild mitral valve regurgitation. No evidence of mitral stenosis.  6. The aortic valve is normal in structure. Aortic valve regurgitation is not visualized. No aortic stenosis is present.  7. The inferior vena cava is normal in size with greater than 50% respiratory variability, suggesting right atrial pressure of 3 mmHg. FINDINGS  Left Ventricle: Left ventricular ejection fraction, by estimation, is 65 to 70%. The left ventricle has hyperdynamic function. The left ventricle has no regional wall motion abnormalities. The left ventricular internal cavity  size was normal in size. There is no left ventricular hypertrophy. Left ventricular diastolic function could not  be evaluated due to atrial fibrillation. Left ventricular diastolic function could not be evaluated. Right Ventricle: The right ventricular size is normal. No increase in right ventricular wall thickness. Right ventricular systolic function is normal. There is moderately elevated pulmonary artery systolic pressure. The tricuspid regurgitant velocity is 2.82 m/s, and with an assumed right atrial pressure of 15 mmHg, the estimated right ventricular systolic pressure is 30.0 mmHg. Left Atrium: Left atrial size was mildly dilated. Right Atrium: Right atrial size was mildly dilated. Pericardium: There is no evidence of pericardial effusion. Mitral Valve: The mitral valve is normal in structure. Normal mobility of the mitral valve leaflets. Mild mitral valve regurgitation. No evidence of mitral valve stenosis. Tricuspid Valve: The tricuspid valve is normal in structure. Tricuspid valve regurgitation is mild . No evidence of tricuspid stenosis. Aortic Valve: The aortic valve is normal in structure. Aortic valve regurgitation is not visualized. No aortic stenosis is present. Pulmonic Valve: The pulmonic valve was normal in structure. Pulmonic valve regurgitation is not visualized. No evidence of pulmonic stenosis. Aorta: The aortic root is normal in size and structure. Venous: The inferior vena cava is normal in size with greater than 50% respiratory variability, suggesting right atrial pressure of 3 mmHg. IAS/Shunts: No atrial level shunt detected by color flow Doppler.  LEFT VENTRICLE PLAX 2D LVIDd:         4.00 cm LVIDs:         2.80 cm LV PW:         1.30 cm LV IVS:        1.40 cm LVOT diam:     2.00 cm LV SV:         51 LV SV Index:   20 LVOT Area:     3.14 cm  RIGHT VENTRICLE RV S prime:     10.20 cm/s LEFT ATRIUM             Index       RIGHT ATRIUM           Index LA diam:        5.00 cm 1.95 cm/m  RA  Area:     17.40 cm LA Vol (A2C):   69.2 ml 26.98 ml/m RA Volume:   42.70 ml  16.65 ml/m LA Vol (A4C):   88.8 ml 34.62 ml/m LA Biplane Vol: 78.6 ml 30.64 ml/m  AORTIC VALVE LVOT Vmax:   92.80 cm/s LVOT Vmean:  57.300 cm/s LVOT VTI:    0.161 m  AORTA Ao Root diam: 3.10 cm Ao Asc diam:  3.30 cm TRICUSPID VALVE TR Peak grad:   31.8 mmHg TR Vmax:        282.00 cm/s  SHUNTS Systemic VTI:  0.16 m Systemic Diam: 2.00 cm Ena Dawley MD Electronically signed by Ena Dawley MD Signature Date/Time: 12/09/2019/4:22:37 PM    Final     Microbiology: No results found for this or any previous visit (from the past 240 hour(s)).   Labs: Basic Metabolic Panel: Recent Labs  Lab 12/13/19 0526 12/15/19 0354 12/15/19 0929 12/16/19 0302 12/17/19 0435 12/18/19 0441 12/19/19 0431  NA  --  142  --  139 140 142 139  K  --  3.7  --  3.4* 3.4* 3.9 3.1*  CL  --  103  --  100 101 103 102  CO2  --  28  --  '29 28 30 28  ' GLUCOSE  --  165*  --  166* 134* 119* 143*  BUN  --  14  --  '12 9 13 12  ' CREATININE  --  0.77  --  0.83 0.73 0.87 0.76  CALCIUM  --  8.9  --  8.8* 9.0 9.2 8.9  MG   < >  --  1.9 1.8 2.1 1.9 1.6*  PHOS  --   --  3.6 3.8 3.6 4.1 4.0   < > = values in this interval not displayed.   Liver Function Tests: Recent Labs  Lab 12/16/19 0302 12/17/19 0435 12/18/19 0441 12/19/19 0431  AST '19 18 17 19  ' ALT '11 11 11 9  ' ALKPHOS 105 101 108 108  BILITOT 1.0 1.0 0.9 1.0  PROT 7.1 7.1 7.2 7.2  ALBUMIN 3.4* 3.2* 3.3* 3.3*   No results for input(s): LIPASE, AMYLASE in the last 168 hours. No results for input(s): AMMONIA in the last 168 hours. CBC: Recent Labs  Lab 12/15/19 0929 12/16/19 0302 12/17/19 0435 12/18/19 0441 12/19/19 0431  WBC 5.0 4.8 4.5 4.4 4.4  NEUTROABS 3.3  --   --   --   --   HGB 9.9* 10.6* 10.6* 10.8* 11.0*  HCT 32.5* 34.9* 34.5* 35.5* 35.9*  MCV 86.4 86.6 85.4 86.6 85.7  PLT 203 223 221 213 219   Cardiac Enzymes: No results for input(s): CKTOTAL, CKMB, CKMBINDEX,  TROPONINI in the last 168 hours. BNP: BNP (last 3 results) Recent Labs    04/09/19 0011 07/18/19 1601 12/08/19 2052  BNP 372.3* 229.7* 391.6*    ProBNP (last 3 results) No results for input(s): PROBNP in the last 8760 hours.  CBG: Recent Labs  Lab 12/18/19 1111 12/18/19 1556 12/18/19 2124 12/19/19 0614 12/19/19 1111  GLUCAP 144* 304* 247* 118* 129*       Signed:  Dia Crawford, MD [facility] [phone] pager

## 2019-12-19 NOTE — Plan of Care (Signed)
  Problem: Education: Goal: Ability to demonstrate management of disease process will improve Outcome: Adequate for Discharge Goal: Ability to verbalize understanding of medication therapies will improve Outcome: Adequate for Discharge Goal: Individualized Educational Video(s) Outcome: Adequate for Discharge   Problem: Cardiac: Goal: Ability to achieve and maintain adequate cardiopulmonary perfusion will improve Outcome: Adequate for Discharge   Problem: Education: Goal: Knowledge of General Education information will improve Description: Including pain rating scale, medication(s)/side effects and non-pharmacologic comfort measures Outcome: Adequate for Discharge   Problem: Health Behavior/Discharge Planning: Goal: Ability to manage health-related needs will improve Outcome: Adequate for Discharge   Problem: Clinical Measurements: Goal: Ability to maintain clinical measurements within normal limits will improve Outcome: Adequate for Discharge Goal: Diagnostic test results will improve Outcome: Adequate for Discharge Goal: Cardiovascular complication will be avoided Outcome: Adequate for Discharge

## 2019-12-19 NOTE — Progress Notes (Signed)
Occupational Therapy Treatment + discharge Patient Details Name: [name] MRN: [id number] DOB: [birth year] Today's Date: 12/19/2019    History of present illness 54 y.o. female with medical history significant of chronic diastolic congestive heart failure, hypertension, persistent atrial fibrillation, pulmonary hypertension, SVT, type 2 diabetes, morbid obesity (BMI 52), OSA, bipolar disorder presented to ED with complaints of chest pain, shortness of breath, and lower extremity edema.   OT comments  Pt completing ADL tasks with modified independence using no AD for mobility and managing own IV pole. Pt stood at sink momentarily to wash hands with good balance. Pt reviewed education for energy conservation techniques and reviewed AE for LB dressing. Pt reports efficiency with ADL AE and energy conservation techniques. Pt does not require additional OT skilled services. OT signing off at this time.      Follow Up Recommendations  No OT follow up;Supervision - Intermittent    Equipment Recommendations  Tub/shower seat    Recommendations for Other Services      Precautions / Restrictions Precautions Precautions: Fall Precaution Comments: watch SpO2 Restrictions Weight Bearing Restrictions: No       Mobility Bed Mobility Overal bed mobility: Needs Assistance Bed Mobility: Sit to Supine     Supine to sit: Modified independent (Device/Increase time) Sit to supine: Modified independent (Device/Increase time)   General bed mobility comments: no physical assist required and IV unhooked  Transfers Overall transfer level: Needs assistance Equipment used: None Transfers: Sit to/from Stand Sit to Stand: Modified independent (Device/Increase time)              Balance Overall balance assessment: Needs assistance Sitting-balance support: Feet supported Sitting balance-Leahy Scale: Good       Standing balance-Leahy Scale: Good Standing balance comment: no AD required                           ADL either performed or assessed with clinical judgement   ADL Overall ADL's : Needs assistance/impaired                     Lower Body Dressing: Modified independent;Set up Lower Body Dressing Details (indicate cue type and reason): pt explaining how to use each device, but denied need to doff/donn socks at this time. Toilet Transfer: Modified Dentist and Hygiene: Modified independent       Functional mobility during ADLs: Modified independent General ADL Comments: Pt requiring cues to wash hands.     Vision   Vision Assessment?: No apparent visual deficits   Perception     Praxis      Cognition Arousal/Alertness: Awake/alert Behavior During Therapy: WFL for tasks assessed/performed Overall Cognitive Status: No family/caregiver present to determine baseline cognitive functioning                                 General Comments: Pt unable to verbalize energy conservation techniques even when directed to use sheets. OTR started to summarize worksheet and pt stating "Yes, I plan to be seated for my bathing tasks."        Exercises     Shoulder Instructions       General Comments Energy conservation techniques summarized. Pt able to state 1/3 to perform when at home.    Pertinent Vitals/ Pain       Pain Assessment: No/denies pain  Home Living  Prior Functioning/Environment              Frequency           Progress Toward Goals  OT Goals(current goals can now be found in the care plan section)  Progress towards OT goals: Progressing toward goals;Goals met/education completed, patient discharged from OT  Acute Rehab OT Goals Patient Stated Goal: return home OT Goal Formulation: With patient Time For Goal Achievement: 12/24/19 Potential to Achieve Goals: Good ADL Goals Pt Will Perform Grooming:  with modified independence;standing Pt Will Perform Lower Body Bathing: with modified independence;sit to/from stand;sitting/lateral leans;with adaptive equipment Pt Will Perform Lower Body Dressing: with modified independence;sit to/from stand;sitting/lateral leans;with adaptive equipment Pt Will Transfer to Toilet: with modified independence;ambulating Pt Will Perform Toileting - Clothing Manipulation and hygiene: with modified independence;sit to/from stand Additional ADL Goal #1: Pt will independently verbalize/demosntrate at least 3 energy conservation strategies to use during ADL task.  Plan Discharge plan remains appropriate;All goals met and education completed, patient discharged from OT services    Co-evaluation                 AM-PAC OT "6 Clicks" Daily Activity     Outcome Measure   Help from another person eating meals?: None Help from another person taking care of personal grooming?: None Help from another person toileting, which includes using toliet, bedpan, or urinal?: A Little Help from another person bathing (including washing, rinsing, drying)?: A Little Help from another person to put on and taking off regular upper body clothing?: None Help from another person to put on and taking off regular lower body clothing?: A Lot 6 Click Score: 20    End of Session    OT Visit Diagnosis: Other abnormalities of gait and mobility (R26.89)   Activity Tolerance Patient tolerated treatment well   Patient Left in bed;with call bell/phone within reach   Nurse Communication Mobility status        Time: 1050-1108 OT Time Calculation (min): 18 min  Charges: OT General Charges $OT Visit: 1 Visit OT Treatments $Self Care/Home Management : 8-22 mins  Jefferey Pica, OTR/L Acute Rehabilitation Services Pager: [phone] Office: [phone]    Kaylie Ritter C 12/19/2019, 4:49 PM

## 2019-12-20 ENCOUNTER — Telehealth: Payer: Self-pay

## 2019-12-20 NOTE — Telephone Encounter (Signed)
Transition Care Management Follow-up Telephone Call  Date of discharge and from where: 12/19/2019 from [facility] pt [phone], unable to reach left voice message to call back. Name and contact info provided

## 2019-12-21 ENCOUNTER — Telehealth: Payer: Self-pay

## 2019-12-21 NOTE — Telephone Encounter (Signed)
Transition Care Management Follow-up Telephone Call  Date of discharge and from where: 12/19/2019 from [facility] pt [phone], unable to reach left voice message to call back. Name and contact info provided

## 2019-12-30 ENCOUNTER — Emergency Department (HOSPITAL_COMMUNITY): Payer: Medicaid Other

## 2019-12-30 ENCOUNTER — Other Ambulatory Visit: Payer: Self-pay

## 2019-12-30 ENCOUNTER — Emergency Department (HOSPITAL_COMMUNITY)
Admission: EM | Admit: 2019-12-30 | Discharge: 2019-12-30 | Disposition: A | Discharge status: Home / Self Care | Payer: Medicaid Other | Attending: Emergency Medicine | Admitting: Emergency Medicine

## 2019-12-30 ENCOUNTER — Encounter (HOSPITAL_COMMUNITY): Payer: Self-pay

## 2019-12-30 DIAGNOSIS — R0789 Other chest pain: Secondary | ICD-10-CM

## 2019-12-30 DIAGNOSIS — R0602 Shortness of breath: Secondary | ICD-10-CM | POA: Diagnosis not present

## 2019-12-30 DIAGNOSIS — I4891 Unspecified atrial fibrillation: Secondary | ICD-10-CM | POA: Insufficient documentation

## 2019-12-30 DIAGNOSIS — E119 Type 2 diabetes mellitus without complications: Secondary | ICD-10-CM | POA: Insufficient documentation

## 2019-12-30 DIAGNOSIS — I5032 Chronic diastolic (congestive) heart failure: Secondary | ICD-10-CM | POA: Diagnosis not present

## 2019-12-30 DIAGNOSIS — Z20822 Contact with and (suspected) exposure to covid-19: Secondary | ICD-10-CM | POA: Diagnosis not present

## 2019-12-30 DIAGNOSIS — Z7984 Long term (current) use of oral hypoglycemic drugs: Secondary | ICD-10-CM | POA: Insufficient documentation

## 2019-12-30 DIAGNOSIS — I110 Hypertensive heart disease with heart failure: Secondary | ICD-10-CM | POA: Diagnosis not present

## 2019-12-30 DIAGNOSIS — R079 Chest pain, unspecified: Secondary | ICD-10-CM | POA: Diagnosis not present

## 2019-12-30 DIAGNOSIS — I10 Essential (primary) hypertension: Secondary | ICD-10-CM | POA: Diagnosis not present

## 2019-12-30 DIAGNOSIS — I2781 Cor pulmonale (chronic): Secondary | ICD-10-CM | POA: Insufficient documentation

## 2019-12-30 DIAGNOSIS — Z7901 Long term (current) use of anticoagulants: Secondary | ICD-10-CM | POA: Diagnosis not present

## 2019-12-30 LAB — BASIC METABOLIC PANEL
Anion gap: 10 (ref 5–15)
BUN: 17 mg/dL (ref 6–20)
CO2: 26 mmol/L (ref 22–32)
Calcium: 9 mg/dL (ref 8.9–10.3)
Chloride: 101 mmol/L (ref 98–111)
Creatinine, Ser: 0.85 mg/dL (ref 0.44–1.00)
GFR calc Af Amer: >60 mL/min (ref >60)
GFR calc non Af Amer: >60 mL/min (ref >60)
Glucose, Bld: 277 mg/dL — ABNORMAL HIGH (ref 70–99)
Potassium: 4 mmol/L (ref 3.5–5.1)
Sodium: 137 mmol/L (ref 135–145)

## 2019-12-30 LAB — I-STAT BETA HCG BLOOD, ED (MC, WL, AP ONLY): I-stat hCG, quantitative: <5 m[IU]/mL (ref <5)

## 2019-12-30 LAB — TROPONIN I (HIGH SENSITIVITY)
Troponin I (High Sensitivity): 4 ng/L (ref <18)
Troponin I (High Sensitivity): 4 ng/L (ref <18)

## 2019-12-30 LAB — CBC
HCT: 37.1 % (ref 36.0–46.0)
Hemoglobin: 11.1 g/dL — ABNORMAL LOW (ref 12.0–15.0)
MCH: 26.2 pg (ref 26.0–34.0)
MCHC: 29.9 g/dL — ABNORMAL LOW (ref 30.0–36.0)
MCV: 87.7 fL (ref 80.0–100.0)
Platelets: 197 10*3/uL (ref 150–400)
RBC: 4.23 MIL/uL (ref 3.87–5.11)
RDW: 15.4 % (ref 11.5–15.5)
WBC: 5.5 10*3/uL (ref 4.0–10.5)
nRBC: 0 % (ref 0.0–0.2)

## 2019-12-30 LAB — HEPATIC FUNCTION PANEL
ALT: 11 U/L (ref 0–44)
AST: 20 U/L (ref 15–41)
Albumin: 3.5 g/dL (ref 3.5–5.0)
Alkaline Phosphatase: 101 U/L (ref 38–126)
Bilirubin, Direct: <0.1 mg/dL (ref 0.0–0.2)
Total Bilirubin: 0.5 mg/dL (ref 0.3–1.2)
Total Protein: 7.4 g/dL (ref 6.5–8.1)

## 2019-12-30 LAB — BRAIN NATRIURETIC PEPTIDE: B Natriuretic Peptide: 268.2 pg/mL — ABNORMAL HIGH (ref 0.0–100.0)

## 2019-12-30 LAB — D-DIMER, QUANTITATIVE: D-Dimer, Quant: <0.27 ug/mL-FEU (ref 0.00–0.50)

## 2019-12-30 LAB — POC SARS CORONAVIRUS 2 AG -  ED: SARS Coronavirus 2 Ag: NEGATIVE

## 2019-12-30 LAB — SARS CORONAVIRUS 2 (TAT 6-24 HRS): SARS Coronavirus 2: NEGATIVE

## 2019-12-30 LAB — LIPASE, BLOOD: Lipase: 44 U/L (ref 11–51)

## 2019-12-30 MED ORDER — ALUM & MAG HYDROXIDE-SIMETH 200-200-20 MG/5ML PO SUSP
30.0000 mL | Freq: Once | ORAL | Status: AC
  Administered 2019-12-30: 30 mL via ORAL
  Filled 2019-12-30: qty 30

## 2019-12-30 MED ORDER — TORSEMIDE 20 MG PO TABS: 80.0000 mg | ORAL_TABLET | Freq: Two times a day (BID) | ORAL | 0 refills | Status: DC

## 2019-12-30 MED ORDER — SODIUM CHLORIDE 0.9% FLUSH: 3.0000 mL | Freq: Once | INTRAVENOUS | Status: DC

## 2019-12-30 MED ORDER — FUROSEMIDE 10 MG/ML IJ SOLN
40.0000 mg | Freq: Once | INTRAMUSCULAR | Status: DC
  Filled 2019-12-30: qty 4

## 2019-12-30 NOTE — Discharge Instructions (Addendum)
Please take your medications.  Please follow up with your cardiologist and primary care doctor.  Today you have been given a prescription for torsemide. Please get the torsemide filled as soon as possible.  When you have the torsemide available then you need to stop taking Lasix and start the torsemide instead.  Do not take both of these medications at the same time.  Do not stop taking your Lasix until you have torsemide available.

## 2019-12-30 NOTE — ED Notes (Signed)
Pt ambulated to the restroom.

## 2019-12-30 NOTE — ED Notes (Signed)
Called lab to add on hepatic and lipase 

## 2019-12-30 NOTE — ED Provider Notes (Signed)
[facility] EMERGENCY DEPARTMENT Provider Note   CSN: [id number] Arrival date & time: 12/30/19  3875     History Chief Complaint  Patient presents with  . Chest Pain    [name] is a 54 y.o. female with a past medical history of CHF, morbid obesity, hypertension, chronic cor pulmonale, hypertension, SVT, DM 2, OSA, A. fib anticoagulated with Xarelto who presents today for evaluation of sudden onset of chest pain.  She reports that about 2 hours prior to arrival she was eating and had sudden onset of severe pressure in the upper middle of her chest.  She reports with that she feels very short of breath.  She denies any alleviating factors, stating that it is continuing to worsen.  No nausea, or vomiting.  She reports both pain with breathing and feeling short of breath.  Her chest pain radiates into the back of her left shoulder.  She reports that yesterday she did not take her medications.  She denies any coughing, difficulty swallowing or feeling like something got stuck in her throat while she was eating.  HPI     Past Medical History:  Diagnosis Date  . Acute on chronic diastolic congestive heart failure ([place]) 11/02/2013   10/03/2015, 11/13/2015, 08/03/2017  . Benign essential HTN 11/28/2013  . Bipolar disease, chronic ([place])   . Chest pain    a. 2012 Myoview: EF 63%, no isch/infarct;  b. 04/2016 Lexiscan MV: EF 73%, no ischemia/infarct-->Low risk.  . Chronic diastolic CHF (congestive heart failure) ([place]) 07/23/2011   a. 2015 Echo: EF 55-60%, Gr2 DD;  b. 09/2015 Echo: EF 60-65%, no rwma, mod dil LA, PASP 51mHg.  . Cor pulmonale (chronic) ([facility]   . History of thyrotoxicosis   . HTN (hypertension) 11/28/2013  . Hypertensive heart disease 10/18/2013  . Hypoglycemia   . Insulin dependent type 2 diabetes mellitus, uncontrolled ([facility]   . Mediastinal adenopathy   . Morbid obesity due to excess calories ([facility] 02/19/2011  . Morbid obesity with BMI of 50.0-59.9,  adult ([place]   . OSA (obstructive sleep apnea) 03/06/2011  . Persistent atrial fibrillation ([facility] 12/09/2017  . Pulmonary HTN, moderate to severe 11/03/2013  . Sinusitis, chronic 01/02/2015  . SVT (supraventricular tachycardia) ([facility] 12/06/2013  . Uncontrolled type 2 diabetes mellitus with hyperglycemia ([facility]     Patient Active Problem List   Diagnosis Date Noted  . Obesity hypoventilation syndrome ([facility] 12/15/2019  . Depression 12/15/2019  . Class 3 obesity 12/10/2019  . Acute exacerbation of CHF (congestive heart failure) ([facility] 12/09/2019  . ILD (interstitial lung disease) ([facility] 05/28/2019  . Exertional angina ([facility] 05/28/2019  . AKI (acute kidney injury) ([facility]   . Acute CHF (congestive heart failure) ([facility] 08/03/2018  . A-fib ([facility] 08/03/2018  . Vitamin B12 deficiency 07/20/2018  . Hematochezia 07/14/2018  . Acute posthemorrhagic anemia 07/14/2018  . GIB (gastrointestinal bleeding) 07/14/2018  . Chronic anticoagulation 07/14/2018  . Persistent atrial fibrillation   . Uncontrolled type 2 diabetes mellitus with hyperglycemia ([facility]   . Hypokalemia   . Cor pulmonale (chronic) ([place]   . Mycobacterium avium complex ([facility] 12/13/2015  . Pyrexia   . Dyspnea 11/13/2015  . Mediastinal adenopathy 11/13/2015  . Acute on chronic diastolic heart failure ([facility] 11/13/2015  . Abnormal CT scan, chest 11/11/2015  . Chest pain 11/11/2015  . Essential hypertension 03/07/2015  . Depression (emotion) 03/07/2015  . Noninfectious gastroenteritis and colitis 01/02/2015  . Sinusitis, chronic 01/02/2015  . Midline low back pain without sciatica  09/10/2014  . Bipolar 1 disorder, mixed, moderate ([place]) 07/02/2014  . Stress incontinence 07/02/2014  . Mania ([place]) 12/10/2013  . Speech abnormality 12/08/2013  . SVT (supraventricular tachycardia) ([place]) 12/06/2013  . Benign essential HTN 11/28/2013  . HTN (hypertension) 11/28/2013  . Type 2 diabetes mellitus ([place]) 11/28/2013  . Pulmonary HTN, moderate to  severe 11/03/2013  . Acute on chronic diastolic congestive heart failure ([place]) 11/02/2013  . Hypertensive heart disease 10/18/2013  . Chronic diastolic heart failure ([place]) 07/23/2011  . OSA (obstructive sleep apnea)- non compliant with C-pap 03/06/2011  . Morbid obesity ([place]) 02/19/2011  . Non-ST elevation (NSTEMI) myocardial infarction ([employer]) 04/29/2009  . Bipolar disorder     Past Surgical History:  Procedure Laterality Date  . CARDIOVERSION N/A 04/05/2018   Procedure: CARDIOVERSION;  Surgeon: Lelon Perla, MD;  Location: [facility] ENDOSCOPY;  Service: Cardiovascular;  Laterality: N/A;  . COLONOSCOPY WITH PROPOFOL Left 07/16/2018   Procedure: COLONOSCOPY WITH PROPOFOL;  Surgeon: Ronnette Juniper, MD;  Location: [facility] ENDOSCOPY;  Service: Gastroenterology;  Laterality: Left;  . LEFT HEART CATH AND CORONARY ANGIOGRAPHY N/A 08/04/2018   Procedure: LEFT HEART CATH AND CORONARY ANGIOGRAPHY;  Surgeon: Belva Crome, MD;  Location: [facility] CV LAB;  Service: Cardiovascular;  Laterality: N/A;  . None    . POLYPECTOMY  07/16/2018   Procedure: POLYPECTOMY;  Surgeon: Ronnette Juniper, MD;  Location: [facility] ENDOSCOPY;  Service: Gastroenterology;;     OB History   No obstetric history on file.     Family History  Problem Relation Age of Onset  . Heart failure Father   . Stroke Father   . Hypertension Mother   . Heart disease Maternal Grandfather     Social History   Tobacco Use  . Smoking status: Never Smoker  . Smokeless tobacco: Never Used  Substance Use Topics  . Alcohol use: No  . Drug use: No    Home Medications Prior to Admission medications   Medication Sig Start Date End Date Taking? Authorizing Provider  Accu-Chek Softclix Lancets lancets USE AS DIRECTED TO TEST BLOOD SUGAR ONCE DAILY 05/23/19   [name], NP  albuterol [facility] HFA) 108 (90 Base) MCG/ACT inhaler INHALE 1 TO 2 PUFFS EVERY 6 HOURS AS NEEDED FOR WHEEZING/ SHORTNESS OF BREATH 11/07/19   [name], NP  amLODipine  (NORVASC) 10 MG tablet Take 10 mg by mouth daily.    [provider]  atorvastatin (LIPITOR) 40 MG tablet Take 1 tablet (40 mg total) by mouth daily at 6 PM. Patient not taking: Reported on 07/18/2019 05/23/19   [name], NP  Blood Glucose Monitoring Suppl (ACCU-CHEK AVIVA PLUS) w/Device KIT 1 each by Does not apply route daily. USE AS DIRECTED TO TEST BLOOD SUGAR ONCE DAILY 08/23/18   [name], NP  Blood Glucose Monitoring Suppl (TRUE METRIX METER) w/Device KIT Use as instructed. Check blood glucose levels by fingerstick twice per day 08/16/18   [name], NP  carvedilol (COREG) 25 MG tablet Take 25 mg by mouth 2 (two) times daily with a meal.    [provider]  escitalopram (LEXAPRO) 10 MG tablet 1/2 ab PO daily x 2 wks then 1 tab PO daily Patient taking differently: Take 10 mg by mouth daily.  07/13/19   Ladell Pier, MD  ferrous sulfate (FEROSUL) 325 (65 FE) MG tablet Take 1 tablet (325 mg total) by mouth 2 (two) times daily. Patient taking differently: Take 325 mg by mouth See admin  instructions. Pt states she takes sometimes 10/04/18   Kerin Ransom K, PA-C  glipiZIDE (GLUCOTROL XL) 10 MG 24 hr tablet Take 10 mg by mouth every evening.    [provider]  glucose blood (ACCU-CHEK AVIVA PLUS) test strip USE AS DIRECTED TO TEST BLOOD SUGAR ONCE DAILY 05/23/19   [name], NP  insulin detemir (LEVEMIR) 100 unit/ml SOLN Inject 0.2 mLs (20 Units total) into the skin daily. 12/19/19   Allie Bossier, MD  Insulin Pen Needle 29G X 12MM MISC Use per instructions 12/19/19   Allie Bossier, MD  metFORMIN (GLUCOPHAGE) 1000 MG tablet Take 1 tablet (1,000 mg total) by mouth 2 (two) times daily with a meal. 07/18/19   [name], NP  Misc. Devices MISC Please provide BiPAP machine with the following: Settings 22/18 cm H2O. Medium  size [employer] Full Face Mask Simplus mask and heated  humidification. 11/07/19   [name], NP    oxybutynin (DITROPAN XL) 15 MG 24 hr tablet Take 1 tablet (15 mg total) by mouth at bedtime. 05/23/19   [name], NP  Potassium Chloride ER 20 MEQ TBCR Take 20 mEq by mouth 2 (two) times daily. 05/23/19   [name], NP  risperidone (RISPERDAL) 4 MG tablet Take 1 tablet (4 mg total) by mouth daily. 07/18/19   [name], NP  rivaroxaban (XARELTO) 20 MG TABS tablet TAKE 1 TABLET (20 MG TOTAL) BY MOUTH DAILY WITH SUPPER. 07/18/19   [name], NP  torsemide (DEMADEX) 20 MG tablet Take 4 tablets (80 mg total) by mouth 2 (two) times daily for 14 days. 12/30/19 01/13/20  Lorin Glass, PA-C  furosemide (LASIX) 80 MG tablet Take 1 tablet (80 mg total) by mouth in the morning, at noon, and at bedtime. 12/19/19 12/30/19  Allie Bossier, MD    Allergies    Acetaminophen, Caffeine, Farxiga [dapagliflozin], and Lisinopril  Review of Systems   Review of Systems  Constitutional: Negative for chills and fever.  Eyes: Negative for visual disturbance.  Respiratory: Positive for chest tightness and shortness of breath. Negative for cough.   Cardiovascular: Positive for chest pain. Negative for palpitations and leg swelling.  Gastrointestinal: Negative for abdominal pain, nausea and vomiting.  Genitourinary: Negative for dysuria.  Musculoskeletal: Negative for back pain and neck pain.  Neurological: Negative for weakness and headaches.  All other systems reviewed and are negative.   Physical Exam Updated Vital Signs BP (!) 143/83   Pulse 90   Temp 98 F (36.7 C) (Oral)   Resp 17   SpO2 98%   Physical Exam Vitals and nursing note reviewed.  Constitutional:      General: She is not in acute distress.    Appearance: She is well-developed.  HENT:     Head: Normocephalic and atraumatic.  Eyes:     Conjunctiva/sclera: Conjunctivae normal.  Neck:     Vascular: No JVD.  Cardiovascular:     Rate and Rhythm: Normal rate and regular rhythm.     Pulses:          Radial  pulses are 2+ on the right side and 2+ on the left side.       Dorsalis pedis pulses are 2+ on the right side and 2+ on the left side.     Heart sounds: Normal heart sounds. No murmur.  Pulmonary:     Effort: Accessory muscle usage and respiratory distress (Mild) present.  Breath sounds: Normal breath sounds.  Chest:     Chest wall: Tenderness (Mild, reports her pain is different. ) present.  Abdominal:     Palpations: Abdomen is soft.     Tenderness: There is no abdominal tenderness.  Musculoskeletal:     Cervical back: Normal range of motion and neck supple.     Right lower leg: No tenderness. Edema present.     Left lower leg: No tenderness. Edema present.  Skin:    General: Skin is warm and dry.  Neurological:     General: No focal deficit present.     Mental Status: She is alert.     Cranial Nerves: No cranial nerve deficit.  Psychiatric:        Mood and Affect: Mood normal.        Behavior: Behavior normal.     ED Results / Procedures / Treatments   Labs (all labs ordered are listed, but only abnormal results are displayed) Labs Reviewed  BASIC METABOLIC PANEL - Abnormal; Notable for the following components:      Result Value   Glucose, Bld 277 (*)    All other components within normal limits  CBC - Abnormal; Notable for the following components:   Hemoglobin 11.1 (*)    MCHC 29.9 (*)    All other components within normal limits  BRAIN NATRIURETIC PEPTIDE - Abnormal; Notable for the following components:   B Natriuretic Peptide 268.2 (*)    All other components within normal limits  SARS CORONAVIRUS 2 (TAT 6-24 HRS)  HEPATIC FUNCTION PANEL  LIPASE, BLOOD  D-DIMER, QUANTITATIVE (NOT AT [facility])  I-STAT BETA HCG BLOOD, ED ([facility], [facility], [facility] ONLY)  POC SARS CORONAVIRUS 2 AG -  ED  TROPONIN I (HIGH SENSITIVITY)  TROPONIN I (HIGH SENSITIVITY)    EKG EKG Interpretation  Date/Time:  Saturday December 30 2019 22:14:38 EDT Ventricular Rate:  90 PR Interval:    QRS  Duration: 89 QT Interval:  359 QTC Calculation: 440 R Axis:   124 Text Interpretation: Atrial fibrillation Probable lateral infarct, old Probable anteroseptal infarct, old Confirmed by Virgel Manifold [phone]) on 12/30/2019 11:10:04 PM   Radiology DG Chest 2 View  Result Date: 12/30/2019 CLINICAL DATA:  Central chest pain. EXAM: CHEST - 2 VIEW COMPARISON:  December 08, 2019 FINDINGS: Cardiomediastinal silhouette is enlarged. Mixed pattern pulmonary edema. No evidence of lobar consolidation, pleural effusion or pneumothorax. Osseous structures are without acute abnormality. Soft tissues are grossly normal. IMPRESSION: Enlarged cardiac silhouette with mixed pattern pulmonary edema, progressed from the most recent comparison chest radiograph. Electronically Signed   By: Fidela Salisbury M.D.   On: 12/30/2019 17:25    Procedures Procedures (including critical care time)  Medications Ordered in ED Medications  alum & mag hydroxide-simeth (MAALOX/MYLANTA) 200-200-20 MG/5ML suspension 30 mL (30 mLs Oral Given 12/30/19 2151)    ED Course  I have reviewed the triage vital signs and the nursing notes.  Pertinent labs & imaging results that were available during my care of the patient were reviewed by me and considered in my medical decision making (see chart for details).  Clinical Course as of Dec 31 1522  Sat Dec 30, 2019  2224 I spoke with Dr. Sonia Side who    [EH]    Clinical Course User Index [EH] Ollen Gross   MDM Rules/Calculators/A&P                     Patient is a 54 year old  woman who presents today for evaluation of sudden onset of chest pressure.  She was reportedly visiting her brother who is status post cardiac arrest here in the hospital.  She reports the pressure radiates into her left shoulder.  She was treated with Maalox.  BNP is improved.  Minimal anemia, glucose is elevated consistent with baseline.  Covid test and pregnancy test are negative.  Lipase is not  elevated.  Troponin x2 is not elevated.  D-dimer is not elevated. Clinically she appears volume overloaded.  This is consistent with her chest x-ray showing fluid overload. I spoke with cardiology who came to see the patient, did not feel that she required admission and put in a consult note.  There evaluation, input, and help is much appreciated.  Based on their recommendations we will switch her from Lasix to torsemide. Did consider giving a dose of IV Lasix here, however patient declined this as she would have to drive home and did not want to be urinating the entire drive. Recommend outpatient follow-up with primary care doctor and cardiology.  Note: Portions of this report may have been transcribed using voice recognition software. Every effort was made to ensure accuracy; however, inadvertent computerized transcription errors may be present  Final Clinical Impression(s) / ED Diagnoses Final diagnoses:  Atypical chest pain  Cor pulmonale (chronic) ([place])  Essential hypertension    Rx / DC Orders ED Discharge Orders         Ordered    torsemide (DEMADEX) 20 MG tablet  2 times daily     12/30/19 2330           Lorin Glass, Hershal Coria 12/31/19 1527    Virgel Manifold, MD 01/01/20 [phone]

## 2019-12-30 NOTE — Consult Note (Signed)
Cardiology Consultation:   Patient ID: Haylyn Halberg MRN: [id number]; DOB: 1966/08/01  Admit date: 12/30/2019 Date of Consult: 12/30/2019  Primary Care Provider: [name], NP Primary Cardiologist: [name], MD  Primary Electrophysiologist:  None    Patient Profile:   Holland Kotter is a 54 y.o. female with a hx of HFpEF, pulmonary hypertension, obesity, A. fib and HTN who is being seen today for the evaluation of chest pain at the request of the ER.  History of Present Illness:   Ms. Greening was eating lunch today when she experienced severe pressure in the middle of her chest. She notes feeling short of breath and having radiation of the pain to her shoulder. The symptoms lasted approximately 2 hours and then resolved prior to her arrival in the ER. She has been visiting her brother who is in the hospital right now after a cardiac arrest. She feels back to baseline at this time. She has not had chest pain with exertion. She denies any lightheadedness, dizziness, lower leg swelling, palpitations, orthopnea or PND. She states that she has been compliant with her medications but has not been great about watching what she eats in regards to her diabetes.   Past Medical History:  Diagnosis Date  . Acute on chronic diastolic congestive heart failure ([place]) 11/02/2013   10/03/2015, 11/13/2015, 08/03/2017  . Benign essential HTN 11/28/2013  . Bipolar disease, chronic ([place])   . Chest pain    a. 2012 Myoview: EF 63%, no isch/infarct;  b. 04/2016 Lexiscan MV: EF 73%, no ischemia/infarct-->Low risk.  . Chronic diastolic CHF (congestive heart failure) ([place]) 07/23/2011   a. 2015 Echo: EF 55-60%, Gr2 DD;  b. 09/2015 Echo: EF 60-65%, no rwma, mod dil LA, PASP 20mHg.  . Cor pulmonale (chronic) ([facility]   . History of thyrotoxicosis   . HTN (hypertension) 11/28/2013  . Hypertensive heart disease 10/18/2013  . Hypoglycemia   . Insulin dependent type 2 diabetes mellitus, uncontrolled ([facility]   .  Mediastinal adenopathy   . Morbid obesity due to excess calories ([facility] [place] 02/19/2011  . Morbid obesity with BMI of 50.0-59.9, adult ([facility]   . OSA (obstructive sleep apnea) 03/06/2011  . Persistent atrial fibrillation ([facility] 12/09/2017  . Pulmonary HTN, moderate to severe 11/03/2013  . Sinusitis, chronic 01/02/2015  . SVT (supraventricular tachycardia) ([facility] 12/06/2013  . Uncontrolled type 2 diabetes mellitus with hyperglycemia ([facility]     Past Surgical History:  Procedure Laterality Date  . CARDIOVERSION N/A 04/05/2018   Procedure: CARDIOVERSION;  Surgeon: CLelon Perla MD;  [facility];  Service: Cardiovascular;  Laterality: N/A;  . COLONOSCOPY WITH PROPOFOL Left 07/16/2018   Procedure: COLONOSCOPY WITH PROPOFOL;  Surgeon: KRonnette Juniper MD;  Location: [facility]COPY;  Service: Gastroenterology;  Laterality: Left;  . LEFT HEART CATH AND CORONARY ANGIOGRAPHY N/A 08/04/2018   Procedure: LEFT HEART CATH AND CORONARY ANGIOGRAPHY;  Surgeon: SBelva Crome MD;  [facility];  Service: Cardiovascular;  Laterality: N/A;  . None    . POLYPECTOMY  07/16/2018   Procedure: POLYPECTOMY;  Surgeon: KRonnette Juniper MD;  [facility];  Service: Gastroenterology;;     Home Medications:  Prior to Admission medications   Medication Sig Start Date End Date Taking? Authorizing Provider  Accu-Chek Softclix Lancets lancets USE AS DIRECTED TO TEST BLOOD SUGAR ONCE DAILY 05/23/19   FGildardo Pounds NP  albuterol (PROAIR HFA) 108 (90 Base) MCG/ACT inhaler INHALE 1 TO 2 PUFFS EVERY 6 HOURS AS NEEDED FOR WHEEZING/  SHORTNESS OF BREATH 11/07/19   [name], NP  amLODipine (NORVASC) 10 MG tablet Take 10 mg by mouth daily.    [provider]  atorvastatin (LIPITOR) 40 MG tablet Take 1 tablet (40 mg total) by mouth daily at 6 PM. Patient not taking: Reported on 07/18/2019 05/23/19   [name], NP  Blood Glucose Monitoring Suppl (ACCU-CHEK AVIVA PLUS) w/Device KIT 1 each by Does  not apply route daily. USE AS DIRECTED TO TEST BLOOD SUGAR ONCE DAILY 08/23/18   [name], NP  Blood Glucose Monitoring Suppl (TRUE METRIX METER) w/Device KIT Use as instructed. Check blood glucose levels by fingerstick twice per day 08/16/18   [name], NP  carvedilol (COREG) 25 MG tablet Take 25 mg by mouth 2 (two) times daily with a meal.    [provider]  escitalopram (LEXAPRO) 10 MG tablet 1/2 ab PO daily x 2 wks then 1 tab PO daily Patient taking differently: Take 10 mg by mouth daily.  07/13/19   Ladell Pier, MD  ferrous sulfate (FEROSUL) 325 (65 FE) MG tablet Take 1 tablet (325 mg total) by mouth 2 (two) times daily. Patient taking differently: Take 325 mg by mouth See admin instructions. Pt states she takes sometimes 10/04/18   Erlene Quan, PA-C  furosemide (LASIX) 80 MG tablet Take 1 tablet (80 mg total) by mouth in the morning, at noon, and at bedtime. 12/19/19   Allie Bossier, MD  glipiZIDE (GLUCOTROL XL) 10 MG 24 hr tablet Take 10 mg by mouth every evening.    [provider]  glucose blood (ACCU-CHEK AVIVA PLUS) test strip USE AS DIRECTED TO TEST BLOOD SUGAR ONCE DAILY 05/23/19   [name], NP  insulin detemir (LEVEMIR) 100 unit/ml SOLN Inject 0.2 mLs (20 Units total) into the skin daily. 12/19/19   Allie Bossier, MD  Insulin Pen Needle 29G X 12MM MISC Use per instructions 12/19/19   Allie Bossier, MD  metFORMIN (GLUCOPHAGE) 1000 MG tablet Take 1 tablet (1,000 mg total) by mouth 2 (two) times daily with a meal. 07/18/19   [name], NP  Misc. Devices MISC Please provide BiPAP machine with the following: Settings 22/18 cm H2O. Medium  size [employer] Full Face Mask Simplus mask and heated  humidification. 11/07/19   [name], NP  oxybutynin (DITROPAN XL) 15 MG 24 hr tablet Take 1 tablet (15 mg total) by mouth at bedtime. 05/23/19   [name], NP  Potassium Chloride ER 20 MEQ TBCR Take 20 mEq by mouth 2 (two)  times daily. 05/23/19   [name], NP  risperidone (RISPERDAL) 4 MG tablet Take 1 tablet (4 mg total) by mouth daily. 07/18/19   [name], NP  rivaroxaban (XARELTO) 20 MG TABS tablet TAKE 1 TABLET (20 MG TOTAL) BY MOUTH DAILY WITH SUPPER. 07/18/19   [name], NP    Inpatient Medications: Scheduled Meds: . sodium chloride flush  3 mL Intravenous Once   Continuous Infusions:  PRN Meds:   Allergies:    Allergies  Allergen Reactions  . Acetaminophen Other (See Comments)    Seizure-like "fits" as a child  . Caffeine     Tense, anxiety, increased urination  . [place] [Dapagliflozin] Other (See Comments)    Hallucinations, drop in blood sugar  . Lisinopril Rash    Rash with lisinopril; but fosinopril is ok per patient    Social History:  Social History   Socioeconomic History  . Marital status: Single    Spouse name: Not on file  . Number of children: 0  . Years of education: 22  . Highest education level: Not on file  Occupational History  . Occupation: unemployed  Tobacco Use  . Smoking status: Never Smoker  . Smokeless tobacco: Never Used  Substance and Sexual Activity  . Alcohol use: No  . Drug use: No  . Sexual activity: Not Currently    Birth control/protection: None  Other Topics Concern  . 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 But ran out of money and is no longer attending school. (Note patient has bipolar disorder).      Born in [place] but lived in [place] before coming back to [place].       Primary language is Arabic. Lives with mother and brother.   Social Determinants of Health   Financial Resource Strain:   . Difficulty of Paying Living Expenses:   Food Insecurity:   . Worried About Charity fundraiser in the Last Year:   . Arboriculturist in the Last Year:   Transportation Needs:   . Film/video editor  (Medical):   Marland Kitchen Lack of Transportation (Non-Medical):   Physical Activity:   . Days of Exercise per Week:   . Minutes of Exercise per Session:   Stress:   . Feeling of Stress :   Social Connections:   . Frequency of Communication with Friends and Family:   . Frequency of Social Gatherings with Friends and Family:   . Attends Religious Services:   . Active Member of Clubs or Organizations:   . Attends Archivist Meetings:   Marland Kitchen Marital Status:   Intimate Partner Violence:   . Fear of Current or Ex-Partner:   . Emotionally Abused:   Marland Kitchen Physically Abused:   . Sexually Abused:     Family History:    Family History  Problem Relation Age of Onset  . Heart failure Father   . Stroke Father   . Hypertension Mother   . Heart disease Maternal Grandfather      ROS:  Please see the history of present illness.  All other ROS reviewed and negative.     Physical Exam/Data:   Vitals:   12/30/19 1710 12/30/19 1830 12/30/19 2114 12/30/19 2115  BP:  (!) 146/99 (!) 141/97   Pulse: 89 84 (!) 101   Resp: '13 13  19  ' Temp:      TempSrc:      SpO2: 100% 97% 96%    No intake or output data in the 24 hours ending 12/30/19 2238 Last 3 Weights 12/19/2019 12/18/2019 12/17/2019  Weight (lbs) 330 lb 3.2 oz 340 lb 3.2 oz 340 lb 9.6 oz  Weight (kg) 149.778 kg 154.314 kg 154.495 kg     There is no height or weight on file to calculate BMI.  General:  Morbidly obese, sitting comfortably in her chair HEENT: normal Neck: no JVD Cardiac:  normal S1, S2; RRR; no murmurs, rubs or gallops  Lungs:  clear to auscultation bilaterally, no wheezing, rhonchi or rales  Abd: soft, nontender, no hepatomegaly  Ext: 1+ pitting edema Musculoskeletal:  No deformities, BUE and BLE strength normal and equal Skin: warm and dry  Neuro:  CNs 2-12 intact, no focal  abnormalities noted Psych:  Normal affect   EKG:  The EKG was personally reviewed and demonstrates:  Atrial fibrillation with HR 90 bpm. No ST segment  changes.   Relevant CV Studies: [facility] 08/04/18  Widely patent coronary arteries.  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  Courses between PA and AO.  Mild dynamic compression is noted.  Hypercontractile left ventricle.  Marked elevation in LVEDP, 26 mmHg.  EF greater than 65%.  Echo 12/09/19 1. Left ventricular ejection fraction, by estimation, is 65 to 70%. The  left ventricle has hyperdynamic function. The left ventricle has no  regional wall motion abnormalities. Left ventricular diastolic function  could not be evaluated. Left ventricular  diastolic function could not be evaluated.  2. Right ventricular systolic function is normal. The right ventricular  size is normal. There is moderately elevated pulmonary artery systolic  pressure. The estimated right ventricular systolic pressure is 91.5 mmHg.  3. Left atrial size was mildly dilated.  4. Right atrial size was mildly dilated.  5. The mitral valve is normal in structure. Mild mitral valve  regurgitation. No evidence of mitral stenosis.  6. The aortic valve is normal in structure. Aortic valve regurgitation is  not visualized. No aortic stenosis is present.  7. The inferior vena cava is normal in size with greater than 50%  respiratory variability, suggesting right atrial pressure of 3 mmHg.  Laboratory Data:  High Sensitivity Troponin:   Recent Labs  Lab 12/08/19 2052 12/08/19 2302 12/30/19 1650 12/30/19 1913  TROPONINIHS '4 12 4 4     ' Chemistry Recent Labs  Lab 12/30/19 1650  NA 137  K 4.0  CL 101  CO2 26  GLUCOSE 277*  BUN 17  CREATININE 0.85  CALCIUM 9.0  GFRNONAA >60  GFRAA >60  ANIONGAP 10    Recent Labs  Lab 12/30/19 1650  PROT 7.4  ALBUMIN 3.5  AST 20  ALT 11  ALKPHOS 101  BILITOT 0.5   Hematology Recent Labs  Lab 12/30/19 1650  WBC 5.5  RBC 4.23  HGB 11.1*  HCT 37.1  MCV 87.7  MCH 26.2  MCHC 29.9*  RDW 15.4  PLT 197   BNP Recent Labs  Lab  12/30/19 1650  BNP 268.2*    DDimer  Recent Labs  Lab 12/30/19 2018  DDIMER <0.27     Radiology/Studies:  DG Chest 2 View  Result Date: 12/30/2019 CLINICAL DATA:  Central chest pain. EXAM: CHEST - 2 VIEW COMPARISON:  December 08, 2019 FINDINGS: Cardiomediastinal silhouette is enlarged. Mixed pattern pulmonary edema. No evidence of lobar consolidation, pleural effusion or pneumothorax. Osseous structures are without acute abnormality. Soft tissues are grossly normal. IMPRESSION: Enlarged cardiac silhouette with mixed pattern pulmonary edema, progressed from the most recent comparison chest radiograph. Electronically Signed   By: Fidela Salisbury M.D.   On: 12/30/2019 17:25    HEAR Score (for undifferentiated chest pain):     2 (low risk)  Assessment and Plan:    Mylynn Dinh is a 54 y.o. female with a hx of HFpEF, pulmonary hypertension, obesity, and HTN who is being seen today for the evaluation of chest pain. She has a long standing history of HFpEF and secondary pulmonary hypertension with recent admission for acute on chronic heart failure exacerbation. She had a normal LHC 1.5 years ago and a normal echo 2 weeks ago. Her troponins are normal. Her chest pressure is not due to an ACS event and does not warrant  further work-up. From a fluid perspective, her JVP is flat and her BNP is lower compared to her prior admission. She does have lower extremity edema which is likely somewhat chronic.   - No further cardiac work-up indicated  - Consider switching lasix 13m TID to torsemide 836mBID for better absorption to help with volume - Continue other cardiac medications - Follow-up with cardiology on 4/6 as scheduled   [facility] will sign off.   Medication Recommendations:  Consider switching lasix to torsemide as above Other recommendations (labs, testing, etc):  None Follow up as an outpatient:  4/6 with JeColetta Memosalready scheduled  For questions or updates, please contact  [facility] consult [email or web address] for contact info under     Signed, CoPrincella PellegriniMD  12/30/2019 10:38 PM

## 2019-12-30 NOTE — ED Triage Notes (Signed)
Pt a visitor in hospital.  Onset 2 hours ago center chest pain radiating to left shoulder, "choking" feeling, and dizziness.

## 2019-12-30 NOTE — ED Notes (Signed)
Two unsuccessful iv attempts

## 2020-01-01 NOTE — Progress Notes (Deleted)
Cardiology Clinic Note   Patient Name: [name] Date of Encounter: 01/01/2020  Primary Care Provider:  [name], NP Primary Cardiologist:  [name], MD  Patient Profile    [name] 54 year old female presents today for follow-up of her chronic diastolic CHF, hypertensive heart disease, and STEMI, pulmonary hypertension, and essential hypertension.  Past Medical History    Past Medical History:  Diagnosis Date  . Acute on chronic diastolic congestive heart failure ([place]) 11/02/2013   10/03/2015, 11/13/2015, 08/03/2017  . Benign essential HTN 11/28/2013  . Bipolar disease, chronic ([place])   . Chest pain    a. 2012 Myoview: EF 63%, no isch/infarct;  b. 04/2016 Lexiscan MV: EF 73%, no ischemia/infarct-->Low risk.  . Chronic diastolic CHF (congestive heart failure) ([place]) 07/23/2011   a. 2015 Echo: EF 55-60%, Gr2 DD;  b. 09/2015 Echo: EF 60-65%, no rwma, mod dil LA, PASP 26mHg.  . Cor pulmonale (chronic) ([place]   . History of thyrotoxicosis   . HTN (hypertension) 11/28/2013  . Hypertensive heart disease 10/18/2013  . Hypoglycemia   . Insulin dependent type 2 diabetes mellitus, uncontrolled ([place]   . Mediastinal adenopathy   . Morbid obesity due to excess calories ([place] 02/19/2011  . Morbid obesity with BMI of 50.0-59.9, adult ([place]   . OSA (obstructive sleep apnea) 03/06/2011  . Persistent atrial fibrillation ([place] 12/09/2017  . Pulmonary HTN, moderate to severe 11/03/2013  . Sinusitis, chronic 01/02/2015  . SVT (supraventricular tachycardia) ([place] 12/06/2013  . Uncontrolled type 2 diabetes mellitus with hyperglycemia ([facility]    Past Surgical History:  Procedure Laterality Date  . CARDIOVERSION N/A 04/05/2018   Procedure: CARDIOVERSION;  Surgeon: CLelon Perla MD;  [facility];  Service: Cardiovascular;  Laterality: N/A;  . COLONOSCOPY WITH PROPOFOL Left 07/16/2018   Procedure: COLONOSCOPY WITH PROPOFOL;  Surgeon: KRonnette Juniper MD;  Location: [facility] ENDOSCOPY;   Service: Gastroenterology;  Laterality: Left;  . LEFT HEART CATH AND CORONARY ANGIOGRAPHY N/A 08/04/2018   Procedure: LEFT HEART CATH AND CORONARY ANGIOGRAPHY;  Surgeon: SBelva Crome MD;  [facility];  Service: Cardiovascular;  Laterality: N/A;  . None    . POLYPECTOMY  07/16/2018   Procedure: POLYPECTOMY;  Surgeon: KRonnette Juniper MD;  Location: [facility] ENDOSCOPY;  Service: Gastroenterology;;    Allergies  Allergies  Allergen Reactions  . Acetaminophen Other (See Comments)    Seizure-like "fits" as a child  . Caffeine     Tense, anxiety, increased urination  . FIran[Dapagliflozin] Other (See Comments)    Hallucinations, drop in blood sugar  . Lisinopril Rash    Rash with lisinopril; but fosinopril is ok per patient    History of Present Illness    [name] a PMH of chronic diastolic heart failure, NSTEMI, pulmonary hypertension moderate to severe, benign essential hypertension, cor pulmonale, persistent atrial fibrillation, OSA, interstitial lung disease, type 2 diabetes, acute kidney injury, bipolar disorder, and morbid obesity.  She also has a history of SVT.  Her echocardiogram 7/19 showed normal LV function, mild mitral regurgitation, mild bilateral enlargement and moderate tricuspid regurgitation.  She was admitted with recurrent chest pain 11/19.  Cardiac enzymes were positive.  A follow-up limited study 11/19 showed normal LV function and biatrial enlargement.  A CTA showed no pulmonary embolus.  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  Ejection fraction was 65%.  Her LVEDP was 26 mmHg.  The procedure was complicated by ventricular fibrillation requiring defibrillation.  This was successful and resulted in sinus rhythm.  She developed contrast neuropathy post procedure.  She was last seen by Dr. Stanford [name] March 14, 2019.  During that time she denied dyspnea.  States she had occasional chest pain that was relieved  by rubbing ointment on her chest.  She was also noted to have bilateral lower extremity edema and denied syncope.  She was in atrial fibrillation at that time, her beta-blocker was continued as well as her Xarelto.  She recently presented to the emergency department on 12/30/2019 with atypical chest pain.  She was ruled out for MI, troponins negative x2, D-dimer not elevated, CXR showed fluid volume overload.  Her furosemide was switched to torsemide and she was instructed to follow-up with her PCP and cardiology.  She presents the clinic today and states***  *** denies chest pain, shortness of breath, lower extremity edema, fatigue, palpitations, melena, hematuria, hemoptysis, diaphoresis, weakness, presyncope, syncope, orthopnea, and PND.  Home Medications    Prior to Admission medications   Medication Sig Start Date End Date Taking? Authorizing Provider  Accu-Chek Softclix Lancets lancets USE AS DIRECTED TO TEST BLOOD SUGAR ONCE DAILY 05/23/19   [name], NP  albuterol [facility] HFA) 108 (90 Base) MCG/ACT inhaler INHALE 1 TO 2 PUFFS EVERY 6 HOURS AS NEEDED FOR WHEEZING/ SHORTNESS OF BREATH 11/07/19   [name], NP  amLODipine (NORVASC) 10 MG tablet Take 10 mg by mouth daily.    [provider]  atorvastatin (LIPITOR) 40 MG tablet Take 1 tablet (40 mg total) by mouth daily at 6 PM. Patient not taking: Reported on 07/18/2019 05/23/19   [name], NP  Blood Glucose Monitoring Suppl (ACCU-CHEK AVIVA PLUS) w/Device KIT 1 each by Does not apply route daily. USE AS DIRECTED TO TEST BLOOD SUGAR ONCE DAILY 08/23/18   [name], NP  Blood Glucose Monitoring Suppl (TRUE METRIX METER) w/Device KIT Use as instructed. Check blood glucose levels by fingerstick twice per day 08/16/18   [name], NP  carvedilol (COREG) 25 MG tablet Take 25 mg by mouth 2 (two) times daily with a meal.    [provider]  escitalopram (LEXAPRO) 10 MG tablet 1/2 ab PO daily x 2 wks  then 1 tab PO daily Patient taking differently: Take 10 mg by mouth daily.  07/13/19   Ladell Pier, MD  ferrous sulfate (FEROSUL) 325 (65 FE) MG tablet Take 1 tablet (325 mg total) by mouth 2 (two) times daily. Patient taking differently: Take 325 mg by mouth See admin instructions. Pt states she takes sometimes 10/04/18   Kerin Ransom K, PA-C  glipiZIDE (GLUCOTROL XL) 10 MG 24 hr tablet Take 10 mg by mouth every evening.    [provider]  glucose blood (ACCU-CHEK AVIVA PLUS) test strip USE AS DIRECTED TO TEST BLOOD SUGAR ONCE DAILY 05/23/19   [name], NP  insulin detemir (LEVEMIR) 100 unit/ml SOLN Inject 0.2 mLs (20 Units total) into the skin daily. 12/19/19   Allie Bossier, MD  Insulin Pen Needle 29G X 12MM MISC Use per instructions 12/19/19   Allie Bossier, MD  metFORMIN (GLUCOPHAGE) 1000 MG tablet Take 1 tablet (1,000 mg total) by mouth 2 (two) times daily with a meal. 07/18/19   [name], NP  Misc. Devices MISC Please provide BiPAP machine with the following: Settings 22/18 cm H2O. Medium  size [employer] Full Face Mask Simplus mask and heated  humidification. 11/07/19  [name], NP  oxybutynin (DITROPAN XL) 15 MG 24 hr tablet Take 1 tablet (15 mg total) by mouth at bedtime. 05/23/19   [name], NP  Potassium Chloride ER 20 MEQ TBCR Take 20 mEq by mouth 2 (two) times daily. 05/23/19   [name], NP  risperidone (RISPERDAL) 4 MG tablet Take 1 tablet (4 mg total) by mouth daily. 07/18/19   [name], NP  rivaroxaban (XARELTO) 20 MG TABS tablet TAKE 1 TABLET (20 MG TOTAL) BY MOUTH DAILY WITH SUPPER. 07/18/19   [name], NP  torsemide (DEMADEX) 20 MG tablet Take 4 tablets (80 mg total) by mouth 2 (two) times daily for 14 days. 12/30/19 01/13/20  Lorin Glass, PA-C  furosemide (LASIX) 80 MG tablet Take 1 tablet (80 mg total) by mouth in the morning, at noon, and at bedtime. 12/19/19 12/30/19  Allie Bossier, MD    Family  History    Family History  Problem Relation Age of Onset  . Heart failure Father   . Stroke Father   . Hypertension Mother   . Heart disease Maternal Grandfather    She indicated that her mother is alive. She indicated that her father is deceased. She reported the following about her brother: HTN. She indicated that her maternal grandmother is deceased. She indicated that her maternal grandfather is deceased. She indicated that her paternal grandmother is deceased. She indicated that her paternal grandfather is deceased.  Social History    Social History   Socioeconomic History  . Marital status: Single    Spouse name: Not on file  . Number of children: 0  . Years of education: 55  . Highest education level: Not on file  Occupational History  . Occupation: unemployed  Tobacco Use  . Smoking status: Never Smoker  . Smokeless tobacco: Never Used  Substance and Sexual Activity  . Alcohol use: No  . Drug use: No  . Sexual activity: Not Currently    Birth control/protection: None  Other Topics Concern  . 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 But ran out of money and is no longer attending school. (Note patient has bipolar disorder).      Born in [place] but lived in [place] before coming back to [place].       Primary language is Arabic. Lives with mother and brother.   Social Determinants of Health   Financial Resource Strain:   . Difficulty of Paying Living Expenses:   Food Insecurity:   . Worried About Charity fundraiser in the Last Year:   . Arboriculturist in the Last Year:   Transportation Needs:   . Film/video editor (Medical):   Marland Kitchen Lack of Transportation (Non-Medical):   Physical Activity:   . Days of Exercise per Week:   . Minutes of Exercise per Session:   Stress:   . Feeling of Stress :   Social Connections:   . Frequency of Communication with  Friends and Family:   . Frequency of Social Gatherings with Friends and Family:   . Attends Religious Services:   . Active Member of Clubs or Organizations:   . Attends Archivist Meetings:   Marland Kitchen Marital Status:   Intimate Partner Violence:   . Fear of Current or Ex-Partner:   . Emotionally Abused:   .  Physically Abused:   . Sexually Abused:      Review of Systems    General:  No chills, fever, night sweats or weight changes.  Cardiovascular:  No chest pain, dyspnea on exertion, edema, orthopnea, palpitations, paroxysmal nocturnal dyspnea. Dermatological: No rash, lesions/masses Respiratory: No cough, dyspnea Urologic: No hematuria, dysuria Abdominal:   No nausea, vomiting, diarrhea, bright red blood per rectum, melena, or hematemesis Neurologic:  No visual changes, wkns, changes in mental status. All other systems reviewed and are otherwise negative except as noted above.  Physical Exam    VS:  There were no vitals taken for this visit. , BMI There is no height or weight on file to calculate BMI. GEN: Well nourished, well developed, in no acute distress. HEENT: normal. Neck: Supple, no JVD, carotid bruits, or masses. Cardiac: RRR, no murmurs, rubs, or gallops. No clubbing, cyanosis, edema.  Radials/DP/PT 2+ and equal bilaterally.  Respiratory:  Respirations regular and unlabored, clear to auscultation bilaterally. GI: Soft, nontender, nondistended, BS + x 4. MS: no deformity or atrophy. Skin: warm and dry, no rash. Neuro:  Strength and sensation are intact. Psych: Normal affect.  Accessory Clinical Findings    ECG personally reviewed by me today- *** - No acute changes  EKG 01/01/2020 Atrial fibrillation 90 bpm  Echocardiogram 12/09/2019 IMPRESSIONS    1. Left ventricular ejection fraction, by estimation, is 65 to 70%. The  left ventricle has hyperdynamic function. The left ventricle has no  regional wall motion abnormalities. Left ventricular diastolic  function  could not be evaluated. Left ventricular  diastolic function could not be evaluated.  2. Right ventricular systolic function is normal. The right ventricular  size is normal. There is moderately elevated pulmonary artery systolic  pressure. The estimated right ventricular systolic pressure is 69.6 mmHg.  3. Left atrial size was mildly dilated.  4. Right atrial size was mildly dilated.  5. The mitral valve is normal in structure. Mild mitral valve  regurgitation. No evidence of mitral stenosis.  6. The aortic valve is normal in structure. Aortic valve regurgitation is  not visualized. No aortic stenosis is present.  7. The inferior vena cava is normal in size with greater than 50%  respiratory variability, suggesting right atrial pressure of 3 mmHg.  Cardiac catheterization 08/04/2018  Widely patent coronary arteries.  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  Courses between PA and AO.  Mild dynamic compression is noted.  Hypercontractile left ventricle.  Marked elevation in LVEDP, 26 mmHg.  EF greater than 65%.  Ventricular fibrillation treated with defibrillation x1.  Conversion to normal sinus rhythm after defibrillation.  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  Resume anticoagulation, likely Xarelto since coronaries are clean.  Further management per treating team.  Discontinue nitroglycerin IV.  Recommend to resume Rivaroxaban, at currently prescribed dose and frequency   Assessment & Plan   1.  Acute on chronic diastolic heart failure/atypical chest pain-presented to the emergency department on 12/30/2019 with atypical chest pain and pain through her shoulder blades.  Chest x-ray showed fluid volume overload.  Her furosemide was switched to torsemide. Continue torsemide 80 mg twice daily Continue potassium chloride 20 mEq twice daily Continue carvedilol 25 mg twice  daily Continue amlodipine 10 mg daily Heart healthy low-sodium diet-salty 6 given Increase physical activity as tolerated Daily weights Lower extremity support stockings Elevate extremities when not active Order BMP  Chronic chest pain/anomalous origin of RCA-cardiac catheterization 11/19 showed  normal coronary arteries Continue medical management  Persistent atrial fibrillation-heart rate today*** Continue carvedilol 25 mg twice daily Continue Xarelto 20 mg daily  Essential hypertension-BP today*** Continue carvedilol 25 mg twice daily Heart healthy low-sodium diet-salty 6 given Increase physical activity as tolerated  Morbid obesity-weight today*** Heart healthy low-sodium diet Increase physical activity as tolerated Continue weight loss  Disposition: Follow-up with me in 2 weeks for medication management and reevaluation.   Jossie Ng. [facility] [place] [address] Office [phone] Fax [phone]

## 2020-01-02 ENCOUNTER — Ambulatory Visit: Payer: Medicaid Other | Admitting: General Practice

## 2020-01-04 ENCOUNTER — Other Ambulatory Visit: Payer: Self-pay

## 2020-01-04 ENCOUNTER — Ambulatory Visit: Payer: Medicaid Other | Admitting: General Practice

## 2020-01-04 ENCOUNTER — Encounter: Payer: Self-pay | Admitting: General Practice

## 2020-01-04 ENCOUNTER — Inpatient Hospital Stay: Admission: AD | Admit: 2020-01-04 | Payer: Medicaid Other | Source: Ambulatory Visit | Admitting: Cardiovascular Disease

## 2020-01-04 VITALS — BP 152/90 | HR 97 | Ht 67.0 in | Wt 344.8 lb

## 2020-01-04 DIAGNOSIS — I10 Essential (primary) hypertension: Secondary | ICD-10-CM

## 2020-01-04 DIAGNOSIS — I5033 Acute on chronic diastolic (congestive) heart failure: Secondary | ICD-10-CM | POA: Diagnosis not present

## 2020-01-04 DIAGNOSIS — Z79899 Other long term (current) drug therapy: Secondary | ICD-10-CM | POA: Diagnosis not present

## 2020-01-04 DIAGNOSIS — G8929 Other chronic pain: Secondary | ICD-10-CM

## 2020-01-04 DIAGNOSIS — R079 Chest pain, unspecified: Secondary | ICD-10-CM | POA: Diagnosis not present

## 2020-01-04 DIAGNOSIS — I4819 Other persistent atrial fibrillation: Secondary | ICD-10-CM

## 2020-01-04 MED ORDER — TORSEMIDE 20 MG PO TABS: 80.0000 mg | ORAL_TABLET | Freq: Two times a day (BID) | ORAL | 1 refills | Status: DC

## 2020-01-04 MED FILL — TORSEMIDE 20 MG TABLET: 20 | 7 days supply | Qty: 60 | Fill #0

## 2020-01-04 NOTE — Patient Instructions (Addendum)
Medication Instructions:  TAKE TORSEMIDE 80MG  TWICE DAILY  CONTINUE POTASSIUM 20MEQ DAILY *If you need a refill on your cardiac medications before your next appointment, please call your pharmacy*  Lab Work: BMET 3 [place] If you have labs (blood work) drawn today and your tests are completely normal, you will receive your results only by:  [place] (if you have MyChart) OR A paper copy in the mail.  If you have any lab test that is abnormal or we need to change your treatment, we will call you to review the results.  Special Instructions PLEASE RESTRICT YOUR FLUIDS TO 32-48OZ DAILY  TAKE AND LOG YOUR WEIGHT DAILY  PLEASE READ AND FOLLOW SALTY 6-ATTACHED  Follow-Up: Your next appointment:  1 week(s)  In Person with [place], FNP-C  At [facility], you and your health needs are our priority.  As part of our continuing mission to provide you with exceptional heart care, we have created designated Provider Care Teams.  These Care Teams include your primary Cardiologist (physician) and Advanced Practice Providers (APPs -  Physician Assistants and Nurse Practitioners) who all work together to provide you with the care you need, when you need it.  We recommend signing up for the patient portal called "MyChart".  Sign up information is provided on this After Visit Summary.  MyChart is used to connect with patients for Virtual Visits (Telemedicine).  Patients are able to view lab/test results, encounter notes, upcoming appointments, etc.  Non-urgent messages can be sent to your provider as well.   To learn more about what you can do with MyChart, go to [email or web address].

## 2020-01-04 NOTE — Progress Notes (Signed)
Cardiology Clinic Note   Patient Name: [name] Date of Encounter: 01/04/2020  Primary Care Provider:  [name], NP Primary Cardiologist:  [name], MD  Patient Profile    [name] 54 year old female presents today for follow-up of her chronic diastolic CHF, hypertensive heart disease, and STEMI, pulmonary hypertension, and essential hypertension.  Past Medical History    Past Medical History:  Diagnosis Date  . Acute on chronic diastolic congestive heart failure ([place]) 11/02/2013   10/03/2015, 11/13/2015, 08/03/2017  . Benign essential HTN 11/28/2013  . Bipolar disease, chronic ([place])   . Chest pain    a. 2012 Myoview: EF 63%, no isch/infarct;  b. 04/2016 Lexiscan MV: EF 73%, no ischemia/infarct-->Low risk.  . Chronic diastolic CHF (congestive heart failure) ([place]) 07/23/2011   a. 2015 Echo: EF 55-60%, Gr2 DD;  b. 09/2015 Echo: EF 60-65%, no rwma, mod dil LA, PASP 65mHg.  . Cor pulmonale (chronic) ([facility]   . History of thyrotoxicosis   . HTN (hypertension) 11/28/2013  . Hypertensive heart disease 10/18/2013  . Hypoglycemia   . Insulin dependent type 2 diabetes mellitus, uncontrolled ([facility]   . Mediastinal adenopathy   . Morbid obesity due to excess calories ([facility] 02/19/2011  . Morbid obesity with BMI of 50.0-59.9, adult ([facility]   . OSA (obstructive sleep apnea) 03/06/2011  . Persistent atrial fibrillation ([place] 12/09/2017  . Pulmonary HTN, moderate to severe 11/03/2013  . Sinusitis, chronic 01/02/2015  . SVT (supraventricular tachycardia) ([place] 12/06/2013  . Uncontrolled type 2 diabetes mellitus with hyperglycemia ([facility]    Past Surgical History:  Procedure Laterality Date  . CARDIOVERSION N/A 04/05/2018   Procedure: CARDIOVERSION;  Surgeon: CLelon Perla MD;  [facility];  Service: Cardiovascular;  Laterality: N/A;  . COLONOSCOPY WITH PROPOFOL Left 07/16/2018   Procedure: COLONOSCOPY WITH PROPOFOL;  Surgeon: KRonnette Juniper MD;  Location: [facility];   Service: Gastroenterology;  Laterality: Left;  . LEFT HEART CATH AND CORONARY ANGIOGRAPHY N/A 08/04/2018   Procedure: LEFT HEART CATH AND CORONARY ANGIOGRAPHY;  Surgeon: SBelva Crome MD;  [facility];  Service: Cardiovascular;  Laterality: N/A;  . None    . POLYPECTOMY  07/16/2018   Procedure: POLYPECTOMY;  Surgeon: KRonnette Juniper MD;  Location: [facility];  Service: Gastroenterology;;    Allergies  Allergies  Allergen Reactions  . Acetaminophen Other (See Comments)    Seizure-like "fits" as a child  . Caffeine     Tense, anxiety, increased urination  . FIran[Dapagliflozin] Other (See Comments)    Hallucinations, drop in blood sugar  . Lisinopril Rash    Rash with lisinopril; but fosinopril is ok per patient    History of Present Illness    Ms. KNeurothhas a PMH of chronic diastolic heart failure, NSTEMI, pulmonary hypertension moderate to severe, benign essential hypertension, cor pulmonale, persistent atrial fibrillation, OSA, interstitial lung disease, type 2 diabetes, acute kidney injury, bipolar disorder, and morbid obesity.  She also has a history of SVT.  Her echocardiogram 7/19 showed normal LV function, mild mitral regurgitation, mild bilateral enlargement and moderate tricuspid regurgitation.  She was admitted with recurrent chest pain 11/19.  Cardiac enzymes were positive.  A follow-up limited study 11/19 showed normal LV function and biatrial enlargement.  A CTA showed no pulmonary embolus.  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  Ejection fraction was 65%.  Her LVEDP was 26 mmHg.  The procedure was complicated by ventricular fibrillation requiring defibrillation.  This was successful and resulted in sinus rhythm.  She developed contrast neuropathy post procedure.  She was last seen by Dr. [name] March 14, 2019.  During that time she denied dyspnea.  States she had occasional chest pain that was relieved  by rubbing ointment on her chest.  She was also noted to have bilateral lower extremity edema and denied syncope.  She was in atrial fibrillation at that time, her beta-blocker was continued as well as her Xarelto.  She recently presented to the emergency department on 12/30/2019 with atypical chest pain.  She was ruled out for MI, troponins negative x2, D-dimer not elevated, CXR showed fluid volume overload.  Her furosemide was switched to torsemide and she was instructed to follow-up with her PCP and cardiology.  She presents the clinic today and states she did not take her torsemide medication because she was worried about switching from her furosemide.  Her weight today is 344 pounds.  She states she does have dyspnea with activity.  She has been trying to restrict her fluids and has been drinking around 120 mL a day which is down from over 3 L.  She also states she does not like to wear lower extremity support stockings.  I have explained the benefits of switching medication to her and her husband.  She is very anxious about medication change but understands the risks of hospital admission and after much discussion has decided to switch to torsemide rather than return to the hospital.  I will have her take torsemide 80 mg twice daily for 1 week, have a BMP in 1 week, give her the salty 6 diet, further restrict her fluids to 32-48 ounces daily and repeat a BMP in 1 week.  Today she denies chest pain,   palpitations, melena, hematuria, hemoptysis, diaphoresis, weakness, presyncope, syncope,  and PND.  Home Medications    Prior to Admission medications   Medication Sig Start Date End Date Taking? Authorizing Provider  Accu-Chek Softclix Lancets lancets USE AS DIRECTED TO TEST BLOOD SUGAR ONCE DAILY 05/23/19   [name], NP  albuterol [facility] HFA) 108 (90 Base) MCG/ACT inhaler INHALE 1 TO 2 PUFFS EVERY 6 HOURS AS NEEDED FOR WHEEZING/ SHORTNESS OF BREATH 11/07/19   [name], NP  amLODipine  (NORVASC) 10 MG tablet Take 10 mg by mouth daily.    [provider]  atorvastatin (LIPITOR) 40 MG tablet Take 1 tablet (40 mg total) by mouth daily at 6 PM. Patient not taking: Reported on 07/18/2019 05/23/19   [name], NP  Blood Glucose Monitoring Suppl (ACCU-CHEK AVIVA PLUS) w/Device KIT 1 each by Does not apply route daily. USE AS DIRECTED TO TEST BLOOD SUGAR ONCE DAILY 08/23/18   [name], NP  Blood Glucose Monitoring Suppl (TRUE METRIX METER) w/Device KIT Use as instructed. Check blood glucose levels by fingerstick twice per day 08/16/18   [name], NP  carvedilol (COREG) 25 MG tablet Take 25 mg by mouth 2 (two) times daily with a meal.    [provider]  escitalopram (LEXAPRO) 10 MG tablet 1/2 ab PO daily x 2 wks then 1 tab PO daily Patient taking differently: Take 10 mg by mouth daily.  07/13/19   Ladell Pier, MD  ferrous sulfate (FEROSUL) 325 (65 FE) MG tablet Take 1 tablet (325 mg total) by mouth 2 (two) times daily. Patient taking differently: Take 325 mg by mouth See admin instructions. Pt states she takes sometimes 10/04/18  Kilroy, Luke K, PA-C  glipiZIDE (GLUCOTROL XL) 10 MG 24 hr tablet Take 10 mg by mouth every evening.    [provider]  glucose blood (ACCU-CHEK AVIVA PLUS) test strip USE AS DIRECTED TO TEST BLOOD SUGAR ONCE DAILY 05/23/19   [name], NP  insulin detemir (LEVEMIR) 100 unit/ml SOLN Inject 0.2 mLs (20 Units total) into the skin daily. 12/19/19   Allie Bossier, MD  Insulin Pen Needle 29G X 12MM MISC Use per instructions 12/19/19   Allie Bossier, MD  metFORMIN (GLUCOPHAGE) 1000 MG tablet Take 1 tablet (1,000 mg total) by mouth 2 (two) times daily with a meal. 07/18/19   [name], NP  Misc. Devices MISC Please provide BiPAP machine with the following: Settings 22/18 cm H2O. Medium  size [employer] Full Face Mask Simplus mask and heated  humidification. 11/07/19   [name], NP    oxybutynin (DITROPAN XL) 15 MG 24 hr tablet Take 1 tablet (15 mg total) by mouth at bedtime. 05/23/19   [name], NP  Potassium Chloride ER 20 MEQ TBCR Take 20 mEq by mouth 2 (two) times daily. 05/23/19   [name], NP  risperidone (RISPERDAL) 4 MG tablet Take 1 tablet (4 mg total) by mouth daily. 07/18/19   [name], NP  rivaroxaban (XARELTO) 20 MG TABS tablet TAKE 1 TABLET (20 MG TOTAL) BY MOUTH DAILY WITH SUPPER. 07/18/19   [name], NP  torsemide (DEMADEX) 20 MG tablet Take 4 tablets (80 mg total) by mouth 2 (two) times daily for 14 days. 12/30/19 01/13/20  Lorin Glass, PA-C  furosemide (LASIX) 80 MG tablet Take 1 tablet (80 mg total) by mouth in the morning, at noon, and at bedtime. 12/19/19 12/30/19  Allie Bossier, MD    Family History    Family History  Problem Relation Age of Onset  . Heart failure Father   . Stroke Father   . Hypertension Mother   . Heart disease Maternal Grandfather    She indicated that her mother is alive. She indicated that her father is deceased. She reported the following about her brother: HTN. She indicated that her maternal grandmother is deceased. She indicated that her maternal grandfather is deceased. She indicated that her paternal grandmother is deceased. She indicated that her paternal grandfather is deceased.  Social History    Social History   Socioeconomic History  . Marital status: Single    Spouse name: Not on file  . Number of children: 0  . Years of education: 31  . Highest education level: Not on file  Occupational History  . Occupation: unemployed  Tobacco Use  . Smoking status: Never Smoker  . Smokeless tobacco: Never Used  Substance and Sexual Activity  . Alcohol use: No  . Drug use: No  . Sexual activity: Not Currently    Birth control/protection: None  Other Topics Concern  . 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 Then was enrolled in a MPH program at [employer]. But ran out of money and is no longer attending school. (Note patient has bipolar disorder).      Born in [place] but lived in [place] before coming back to [place].       Primary language is Arabic. Lives with mother and brother.   Social Determinants of Health   Financial Resource Strain:   .  Difficulty of Paying Living Expenses:   Food Insecurity:   . Worried About Charity fundraiser in the Last Year:   . Arboriculturist in the Last Year:   Transportation Needs:   . Film/video editor (Medical):   Marland Kitchen Lack of Transportation (Non-Medical):   Physical Activity:   . Days of Exercise per Week:   . Minutes of Exercise per Session:   Stress:   . Feeling of Stress :   Social Connections:   . Frequency of Communication with Friends and Family:   . Frequency of Social Gatherings with Friends and Family:   . Attends Religious Services:   . Active Member of Clubs or Organizations:   . Attends Archivist Meetings:   Marland Kitchen Marital Status:   Intimate Partner Violence:   . Fear of Current or Ex-Partner:   . Emotionally Abused:   Marland Kitchen Physically Abused:   . Sexually Abused:      Review of Systems    General:  No chills, fever, night sweats or weight changes.  Cardiovascular:  No chest pain, dyspnea on exertion, edema, orthopnea, palpitations, paroxysmal nocturnal dyspnea. Dermatological: No rash, lesions/masses Respiratory: No cough, dyspnea Urologic: No hematuria, dysuria Abdominal:   No nausea, vomiting, diarrhea, bright red blood per rectum, melena, or hematemesis Neurologic:  No visual changes, wkns, changes in mental status. All other systems reviewed and are otherwise negative except as noted above.  Physical Exam    VS:  BP (!) 152/90 (BP Location: Left Arm, Patient Position: Sitting, Cuff Size: Large)   Pulse 97   Ht '5\' 7"'  (1.702 m)   Wt (!) 344 lb 12.8 oz (156.4 kg)   SpO2 95%   BMI 54.00 kg/m  , BMI Body mass index is 54  kg/m. GEN: Well nourished, well developed, in no acute distress. HEENT: normal. Neck: Supple, no JVD, carotid bruits, or masses. Cardiac: RRR, no murmurs, rubs, or gallops. No clubbing, cyanosis, +2 pitting bilateral lower extremity edema to the mid shin.  Radials/DP/PT 2+ and equal bilaterally.  Respiratory:  Respirations tachypneic , clear to auscultation bilaterally. GI: Soft, nontender, nondistended, BS + x 4. MS: no deformity or atrophy. Skin: warm and dry, no rash. Neuro:  Strength and sensation are intact. Psych: Normal affect.  Accessory Clinical Findings    ECG personally reviewed by me today-none today.  EKG 01/01/2020 Atrial fibrillation 90 bpm  Echocardiogram 12/09/2019 IMPRESSIONS     1. Left ventricular ejection fraction, by estimation, is 65 to 70%. The  left ventricle has hyperdynamic function. The left ventricle has no  regional wall motion abnormalities. Left ventricular diastolic function  could not be evaluated. Left ventricular  diastolic function could not be evaluated.   2. Right ventricular systolic function is normal. The right ventricular  size is normal. There is moderately elevated pulmonary artery systolic  pressure. The estimated right ventricular systolic pressure is 68.0 mmHg.   3. Left atrial size was mildly dilated.   4. Right atrial size was mildly dilated.   5. The mitral valve is normal in structure. Mild mitral valve  regurgitation. No evidence of mitral stenosis.   6. The aortic valve is normal in structure. Aortic valve regurgitation is  not visualized. No aortic stenosis is present.   7. The inferior vena cava is normal in size with greater than 50%  respiratory variability, suggesting right atrial pressure of 3 mmHg.  Cardiac catheterization 08/04/2018  Widely patent coronary arteries.  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  Courses between PA and AO.  Mild dynamic compression is  noted.  Hypercontractile left ventricle.  Marked elevation in LVEDP, 26 mmHg.  EF greater than 65%.  Ventricular fibrillation treated with defibrillation x1.  Conversion to normal sinus rhythm after defibrillation.   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  Resume anticoagulation, likely Xarelto since coronaries are clean.  Further management per treating team.  Discontinue nitroglycerin IV.   Recommend to resume Rivaroxaban, at currently prescribed dose and frequency  Assessment & Plan   1.  Acute on chronic diastolic heart failure/atypical chest pain-presented to the emergency department on 12/30/2019 with atypical chest pain and pain through her shoulder blades.  Chest x-ray showed fluid volume overload.  Her furosemide was switched to torsemide.  She did not begin her torsemide due to anxiety of the medication.  All questions answered and she will transition to torsemide.  Case discussed with DOD Start torsemide 80 mg twice daily Continue potassium chloride 20 mEq twice daily Continue carvedilol 25 mg twice daily Continue amlodipine 10 mg daily Heart healthy low-sodium diet-salty 6 given Increase physical activity as tolerated Daily weights Lower extremity support stockings-patient wishes to defer use Elevate extremities when not active Order BMP in 1 week.  Chronic chest pain/anomalous origin of RCA-cardiac catheterization 11/19 showed normal coronary arteries Continue medical management  Persistent atrial fibrillation-heart rate today 97. Continue carvedilol 25 mg twice daily Continue Xarelto 20 mg daily  Essential hypertension-BP today 152/90 Continue carvedilol 25 mg twice daily Heart healthy low-sodium diet-salty 6 given Increase physical activity as tolerated  Morbid obesity-weight today 344 pounds Heart healthy low-sodium diet Increase physical activity as tolerated Continue weight loss  Disposition:  Follow-up with me in 1 week for medication management and reevaluation.   Jossie Ng. [facility] [address] Office [phone] Fax [phone]

## 2020-01-09 DIAGNOSIS — Z79899 Other long term (current) drug therapy: Secondary | ICD-10-CM | POA: Diagnosis not present

## 2020-01-09 DIAGNOSIS — I5033 Acute on chronic diastolic (congestive) heart failure: Secondary | ICD-10-CM | POA: Diagnosis not present

## 2020-01-09 NOTE — Progress Notes (Signed)
Cardiology Clinic Note   Patient Name: [name] Date of Encounter: 01/11/2020  Primary Care Provider:  [name], NP Primary Cardiologist:  [name], MD  Patient Profile    [name] 54 year old female presents today for follow-up of her chronic diastolic CHF, hypertensive heart disease, and STEMI, pulmonary hypertension, and essential hypertension.  Past Medical History    Past Medical History:  Diagnosis Date  . Acute on chronic diastolic congestive heart failure ([place]) 11/02/2013   10/03/2015, 11/13/2015, 08/03/2017  . Benign essential HTN 11/28/2013  . Bipolar disease, chronic ([place])   . Chest pain    a. 2012 Myoview: EF 63%, no isch/infarct;  b. 04/2016 Lexiscan MV: EF 73%, no ischemia/infarct-->Low risk.  . Chronic diastolic CHF (congestive heart failure) ([place]) 07/23/2011   a. 2015 Echo: EF 55-60%, Gr2 DD;  b. 09/2015 Echo: EF 60-65%, no rwma, mod dil LA, PASP 23mHg.  . Cor pulmonale (chronic) ([facility]   . History of thyrotoxicosis   . HTN (hypertension) 11/28/2013  . Hypertensive heart disease 10/18/2013  . Hypoglycemia   . Insulin dependent type 2 diabetes mellitus, uncontrolled ([place]   . Mediastinal adenopathy   . Morbid obesity due to excess calories ([facility] 02/19/2011  . Morbid obesity with BMI of 50.0-59.9, adult ([place]   . OSA (obstructive sleep apnea) 03/06/2011  . Persistent atrial fibrillation ([facility] 12/09/2017  . Pulmonary HTN, moderate to severe 11/03/2013  . Sinusitis, chronic 01/02/2015  . SVT (supraventricular tachycardia) ([place] 12/06/2013  . Uncontrolled type 2 diabetes mellitus with hyperglycemia ([facility]    Past Surgical History:  Procedure Laterality Date  . CARDIOVERSION N/A 04/05/2018   Procedure: CARDIOVERSION;  Surgeon: CLelon Perla MD;  [facility];  Service: Cardiovascular;  Laterality: N/A;  . COLONOSCOPY WITH PROPOFOL Left 07/16/2018   Procedure: COLONOSCOPY WITH PROPOFOL;  Surgeon: KRonnette Juniper MD;  Location: [facility]COPY;   Service: Gastroenterology;  Laterality: Left;  . LEFT HEART CATH AND CORONARY ANGIOGRAPHY N/A 08/04/2018   Procedure: LEFT HEART CATH AND CORONARY ANGIOGRAPHY;  Surgeon: SBelva Crome MD;  [facility];  Service: Cardiovascular;  Laterality: N/A;  . None    . POLYPECTOMY  07/16/2018   Procedure: POLYPECTOMY;  Surgeon: KRonnette Juniper MD;  Location: [facility] ENDOSCOPY;  Service: Gastroenterology;;    Allergies  Allergies  Allergen Reactions  . Acetaminophen Other (See Comments)    Seizure-like "fits" as a child  . Caffeine     Tense, anxiety, increased urination  . FIran[Dapagliflozin] Other (See Comments)    Hallucinations, drop in blood sugar  . Lisinopril Rash    Rash with lisinopril; but fosinopril is ok per patient    History of Present Illness    Ms. KPilkingtonhas a PMH of chronic diastolic heart failure, NSTEMI, pulmonary hypertension moderate to severe, benign essential hypertension, cor pulmonale, persistent atrial fibrillation, OSA, interstitial lung disease, type 2 diabetes, acute kidney injury, bipolar disorder, and morbid obesity.  She also has a history of SVT.  Her echocardiogram 7/19 showed normal LV function, mild mitral regurgitation, mild bilateral enlargement and moderate tricuspid regurgitation.  She was admitted with recurrent chest pain 11/19.  Cardiac enzymes were positive.  A follow-up limited study 11/19 showed normal LV function and biatrial enlargement.  A CTA showed no pulmonary embolus.  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  Ejection fraction was 65%.  Her LVEDP was 26 mmHg.  The procedure was complicated by ventricular fibrillation requiring defibrillation.  This was successful and resulted in sinus rhythm.  She developed contrast neuropathy post procedure.  She was last seen by Dr. [name] March 14, 2019.  During that time she denied dyspnea.  States she had occasional chest pain that was relieved  by rubbing ointment on her chest.  She was also noted to have bilateral lower extremity edema and denied syncope.  She was in atrial fibrillation at that time, her beta-blocker was continued as well as her Xarelto.  She recently presented to the emergency department on 12/30/2019 with atypical chest pain.  She was ruled out for MI, troponins negative x2, D-dimer not elevated, CXR showed fluid volume overload.  Her furosemide was switched to torsemide and she was instructed to follow-up with her PCP and cardiology.  She presented to the clinic 01/04/20 and stated she did not take her torsemide medication because she was worried about switching from her furosemide.  Her weight was 344 [name].  She stated she did have dyspnea with activity.  She had been trying to restrict her fluids and had been drinking around 120 mL a day which is down from over 3 L.  She also stated she did not like to wear lower extremity support stockings.  I  explained the benefits of switching medication to her and her brother.  She was very anxious about medication change but understood the risks of hospital admission and after much discussion has decided to switch to torsemide rather than return to the hospital.  I  had her take torsemide 80 mg twice daily for 1 week, have a BMP in 1 week, give her the salty 6 diet, further restrict her fluids to 32-48 ounces daily and repeat a BMP in 1 week.  She presents the clinic today for follow-up and states she is starting to feel better this week.  She has almost 6 [name].  She states that she has not been very active in the last week.  She is spending a lot of time in bed going back and forth to the bathroom.  She is trying to eat a low-sodium diet and has been monitoring her weight.  She does feel that in the past her fluid has come off faster.  I will continue her torsemide for another 2 weeks at the current dose and potassium at the current dose.  Follow-up in 2 weeks and have BMP in 2  weeks.  Today she denies chest pain,   palpitations, melena, hematuria, hemoptysis, diaphoresis, weakness, presyncope, syncope,  and PND.  Home Medications    Prior to Admission medications   Medication Sig Start Date End Date Taking? Authorizing Provider  Accu-Chek Softclix Lancets lancets USE AS DIRECTED TO TEST BLOOD SUGAR ONCE DAILY 05/23/19   Vanessa Pounds, NP  albuterol [facility] HFA) 108 (90 Base) MCG/ACT inhaler INHALE 1 TO 2 PUFFS EVERY 6 HOURS AS NEEDED FOR WHEEZING/ SHORTNESS OF BREATH 11/07/19   Vanessa Pounds, NP  amLODipine (NORVASC) 10 MG tablet Take 10 mg by mouth daily.    [provider]  atorvastatin (LIPITOR) 40 MG tablet Take 1 tablet (40 mg total) by mouth daily at 6 PM. Patient not taking: Reported on 07/18/2019 05/23/19   Vanessa Pounds, NP  Blood Glucose Monitoring Suppl (ACCU-CHEK AVIVA PLUS) w/Device KIT 1 each by Does not apply route daily. USE AS DIRECTED TO TEST BLOOD SUGAR ONCE DAILY 08/23/18   Vanessa Pounds, NP  Blood Glucose Monitoring Suppl (TRUE METRIX METER) w/Device KIT Use as  instructed. Check blood glucose levels by fingerstick twice per day 08/16/18   Vanessa Pounds, NP  carvedilol (COREG) 25 MG tablet Take 25 mg by mouth 2 (two) times daily with a meal.    [provider]  escitalopram (LEXAPRO) 10 MG tablet 1/2 ab PO daily x 2 wks then 1 tab PO daily Patient taking differently: Take 10 mg by mouth daily.  07/13/19   Ladell Pier, MD  ferrous sulfate (FEROSUL) 325 (65 FE) MG tablet Take 1 tablet (325 mg total) by mouth 2 (two) times daily. Patient taking differently: Take 325 mg by mouth See admin instructions. Pt states she takes sometimes 10/04/18   Kerin Ransom K, PA-C  glipiZIDE (GLUCOTROL XL) 10 MG 24 hr tablet Take 10 mg by mouth every evening.    [provider]  glucose blood (ACCU-CHEK AVIVA PLUS) test strip USE AS DIRECTED TO TEST BLOOD SUGAR ONCE DAILY 05/23/19   Vanessa Pounds, NP  insulin detemir  (LEVEMIR) 100 unit/ml SOLN Inject 0.2 mLs (20 Units total) into the skin daily. 12/19/19   Allie Bossier, MD  Insulin Pen Needle 29G X 12MM MISC Use per instructions 12/19/19   Allie Bossier, MD  metFORMIN (GLUCOPHAGE) 1000 MG tablet Take 1 tablet (1,000 mg total) by mouth 2 (two) times daily with a meal. 07/18/19   Vanessa Pounds, NP  Misc. Devices MISC Please provide BiPAP machine with the following: Settings 22/18 cm H2O. Medium  size [employer] Full Face Mask Simplus mask and heated  humidification. 11/07/19   Vanessa Pounds, NP  oxybutynin (DITROPAN XL) 15 MG 24 hr tablet Take 1 tablet (15 mg total) by mouth at bedtime. 05/23/19   Vanessa Pounds, NP  Potassium Chloride ER 20 MEQ TBCR Take 20 mEq by mouth 2 (two) times daily. 05/23/19   Vanessa Pounds, NP  risperidone (RISPERDAL) 4 MG tablet Take 1 tablet (4 mg total) by mouth daily. 07/18/19   Vanessa Pounds, NP  rivaroxaban (XARELTO) 20 MG TABS tablet TAKE 1 TABLET (20 MG TOTAL) BY MOUTH DAILY WITH SUPPER. 07/18/19   Vanessa Pounds, NP  torsemide (DEMADEX) 20 MG tablet Take 4 tablets (80 mg total) by mouth 2 (two) times daily. 01/04/20   Deberah Pelton, NP  furosemide (LASIX) 80 MG tablet Take 1 tablet (80 mg total) by mouth in the morning, at noon, and at bedtime. 12/19/19 12/30/19  Allie Bossier, MD    Family History    Family History  Problem Relation Age of Onset  . Heart failure Father   . Stroke Father   . Hypertension Mother   . Heart disease Maternal Grandfather    She indicated that her mother is alive. She indicated that her father is deceased. She reported the following about her brother: HTN. She indicated that her maternal grandmother is deceased. She indicated that her maternal grandfather is deceased. She indicated that her paternal grandmother is deceased. She indicated that her paternal grandfather is deceased.  Social History    Social History   Socioeconomic History  . Marital status: Single     Spouse name: Not on file  . Number of children: 0  . Years of education: 48  . Highest education level: Not on file  Occupational History  . Occupation: unemployed  Tobacco Use  . Smoking status: Never Smoker  . Smokeless tobacco: Never Used  Substance and Sexual Activity  . Alcohol use: No  . Drug  use: No  . Sexual activity: Not Currently    Birth control/protection: None  Other Topics Concern  . 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 But ran out of money and is no longer attending school. (Note patient has bipolar disorder).      Born in [place] but lived in [place] before coming back to [place].       Primary language is Arabic. Lives with mother and brother.   Social Determinants of Health   Financial Resource Strain:   . Difficulty of Paying Living Expenses:   Food Insecurity:   . Worried About Charity fundraiser in the Last Year:   . Arboriculturist in the Last Year:   Transportation Needs:   . Film/video editor (Medical):   Marland Kitchen Lack of Transportation (Non-Medical):   Physical Activity:   . Days of Exercise per Week:   . Minutes of Exercise per Session:   Stress:   . Feeling of Stress :   Social Connections:   . Frequency of Communication with Friends and Family:   . Frequency of Social Gatherings with Friends and Family:   . Attends Religious Services:   . Active Member of Clubs or Organizations:   . Attends Archivist Meetings:   Marland Kitchen Marital Status:   Intimate Partner Violence:   . Fear of Current or Ex-Partner:   . Emotionally Abused:   Marland Kitchen Physically Abused:   . Sexually Abused:      Review of Systems    General:  No chills, fever, night sweats or weight changes.  Cardiovascular:  No chest pain, dyspnea on exertion, edema, orthopnea, palpitations, paroxysmal nocturnal dyspnea. Dermatological: No rash, lesions/masses Respiratory: No  cough, dyspnea Urologic: No hematuria, dysuria Abdominal:   No nausea, vomiting, diarrhea, bright red blood per rectum, melena, or hematemesis Neurologic:  No visual changes, wkns, changes in mental status. All other systems reviewed and are otherwise negative except as noted above.  Physical Exam    VS:  BP 140/82 (BP Location: Left Arm, Patient Position: Sitting, Cuff Size: Large)   Pulse 98   Temp (!) 96.6 F (35.9 C)   Wt (!) 339 lb (153.8 kg)   BMI 53.09 kg/m  , BMI Body mass index is 53.09 kg/m. GEN: Well nourished, well developed, in no acute distress. HEENT: normal. Neck: Supple, no JVD, carotid bruits, or masses. Cardiac: RRR, no murmurs, rubs, or gallops. No clubbing, cyanosis, edema.  Radials/DP/PT 2+ and equal bilaterally.  Respiratory:  Respirations regular and unlabored, clear to auscultation bilaterally. GI: Soft, nontender, nondistended, BS + x 4. MS: no deformity or atrophy. Skin: warm and dry, no rash. Neuro:  Strength and sensation are intact. Psych: Normal affect.  Accessory Clinical Findings    ECG personally reviewed by me today-none today.  EKG 01/01/2020 Atrial fibrillation 90 bpm  Echocardiogram 12/09/2019 IMPRESSIONS    1. Left ventricular ejection fraction, by estimation, is 65 to 70%. The  left ventricle has hyperdynamic function. The left ventricle has no  regional wall motion abnormalities. Left ventricular diastolic function  could not be evaluated. Left ventricular  diastolic function could not be evaluated.  2. Right ventricular systolic function is normal. The right ventricular  size is normal. There is moderately elevated pulmonary artery systolic  pressure. The estimated right ventricular systolic pressure is 87.6 mmHg.  3. Left atrial size was mildly dilated.  4. Right atrial size was mildly dilated.  5. The mitral valve is normal in structure. Mild mitral valve  regurgitation. No evidence of mitral stenosis.  6. The aortic  valve is normal in structure. Aortic valve regurgitation is  not visualized. No aortic stenosis is present.  7. The inferior vena cava is normal in size with greater than 50%  respiratory variability, suggesting right atrial pressure of 3 mmHg.  Cardiac catheterization 08/04/2018  Widely patent coronary arteries.  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 Courses between PA and AO. Mild dynamic compression is noted.  Hypercontractile left ventricle. Marked elevation in LVEDP, 26 mmHg. EF greater than 65%.  Ventricular fibrillation treated with defibrillation x1.  Conversion to normal sinus rhythm after defibrillation.  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  Resume anticoagulation, likely Xarelto since coronaries are clean.  Further management per treating team.  Discontinue nitroglycerin IV.  Recommend to resume Rivaroxaban, at currently prescribed dose and frequency  Assessment & Plan   1.  Acute on chronic diastolic heart failure/atypical chest pain-presented to the emergency department on 12/30/2019 with atypical chest pain and pain through her shoulder blades.  Chest x-ray showed fluid volume overload.  Her furosemide was switched to torsemide.  She did not begin her torsemide due to anxiety of the medication.  She  transition to torsemide last week.  And is down to 339 [name] from 344 [name] 1 week ago. Continue torsemide 80 mg twice daily Continue potassium chloride 20 mEq twice daily Continue carvedilol 25 mg twice daily Continue amlodipine 10 mg daily Heart healthy low-sodium diet-salty 6 given Increase physical activity as tolerated Daily weights Lower extremity support stockings-[place] sheet given. Elevate extremities when not active Order BMP in 2 week.  Chronic chest pain/anomalous origin of RCA-cardiac catheterization 11/19 showed normal coronary arteries Continue medical  management  Persistent atrial fibrillation-heart rate today 98. Continue carvedilol 25 mg twice daily Continue Xarelto 20 mg daily  Essential hypertension-BP today  140/82. Continue carvedilol 25 mg twice daily Heart healthy low-sodium diet-salty 6 given Increase physical activity as tolerated  Morbid obesity-weight today 339 [name] down from 344 [name] 1 week ago.   Heart healthy low-sodium diet Increase physical activity as tolerated Continue weight loss  Disposition: Follow-up with me 2 weeks  Alysia Scism M. [facility] [address] Office [phone] Fax [phone]

## 2020-01-10 LAB — BASIC METABOLIC PANEL
BUN/Creatinine Ratio: 19 (ref 9–23)
BUN: 18 mg/dL (ref 6–24)
CO2: 29 mmol/L (ref 20–29)
Calcium: 9.5 mg/dL (ref 8.7–10.2)
Chloride: 97 mmol/L (ref 96–106)
Creatinine, Ser: 0.93 mg/dL (ref 0.57–1.00)
GFR calc Af Amer: 82 mL/min/{1.73_m2} (ref >59)
GFR calc non Af Amer: 71 mL/min/{1.73_m2} (ref >59)
Glucose: 231 mg/dL — ABNORMAL HIGH (ref 65–99)
Potassium: 3.9 mmol/L (ref 3.5–5.2)
Sodium: 142 mmol/L (ref 134–144)

## 2020-01-11 ENCOUNTER — Inpatient Hospital Stay: Payer: Medicaid Other | Admitting: Nurse Practitioner

## 2020-01-11 ENCOUNTER — Other Ambulatory Visit: Payer: Self-pay | Admitting: General Practice

## 2020-01-11 ENCOUNTER — Encounter: Payer: Self-pay | Admitting: General Practice

## 2020-01-11 ENCOUNTER — Other Ambulatory Visit: Payer: Self-pay

## 2020-01-11 ENCOUNTER — Ambulatory Visit: Payer: Medicaid Other | Admitting: General Practice

## 2020-01-11 VITALS — BP 140/82 | HR 98 | Temp 96.6°F | Wt 339.0 lb

## 2020-01-11 DIAGNOSIS — R079 Chest pain, unspecified: Secondary | ICD-10-CM | POA: Diagnosis not present

## 2020-01-11 DIAGNOSIS — I10 Essential (primary) hypertension: Secondary | ICD-10-CM | POA: Diagnosis not present

## 2020-01-11 DIAGNOSIS — Z79899 Other long term (current) drug therapy: Secondary | ICD-10-CM | POA: Diagnosis not present

## 2020-01-11 DIAGNOSIS — I5033 Acute on chronic diastolic (congestive) heart failure: Secondary | ICD-10-CM | POA: Diagnosis not present

## 2020-01-11 DIAGNOSIS — I4819 Other persistent atrial fibrillation: Secondary | ICD-10-CM | POA: Diagnosis not present

## 2020-01-11 DIAGNOSIS — G8929 Other chronic pain: Secondary | ICD-10-CM

## 2020-01-11 MED FILL — TORSEMIDE 20 MG TABLET: 20 | 7 days supply | Qty: 60 | Fill #1

## 2020-01-11 NOTE — Patient Instructions (Addendum)
Medication Instructions:  CONTINUE TORSEMIDE 80MG  TWICE DAILY UNTIL FOLLOW UP APPOINTMENT *If you need a refill on your cardiac medications before your next appointment, please call your pharmacy*  Lab Work: BMET ON 4-26 OR 4-27 BEFORE FOLLOW UP APPOINTMENT  If you have labs (blood work) drawn today and your tests are completely normal, you will receive your results only by:  [place] (if you have MyChart) OR A paper copy in the mail.  If you have any lab test that is abnormal or we need to change your treatment, we will call you to review the results. You may go to any [facility] that is convenient for you however, we do have a lab in our office that is able to assist you. You DO NOT need an appointment for our lab. The lab is open 8:00am and closes at 4:00pm. Lunch 12:45 - 1:45pm.  Special Instructions TAKE AND LOG YOUR WEIGHT DAILY ABD BRING THIS LOG WITH YOU TO FOLLOW UP APPOINTMENT  PLEASE PURCHASE AND WEAR COMPRESSION STOCKINGS DAILY AND OFF AT BEDTIME. Compression stockings are elastic socks that squeeze the legs. They help to increase blood flow to the legs and to decrease swelling in the legs from fluid retention, and reduce the chance of developing blood clots in the lower legs. Please put on in the AM when dressing and off at night when dressing for bed. PLEASE MAKE SURE TO ELEVATE YOU LEGS WHILE SITTING, THIS WILL HELP WITH THE SWELLING ALSO.  PLEASE READ AND FOLLOW SALTY 6-ATTACHED  Follow-Up: Your next appointment:  ON 01-26-2020 AT 145PM In Person with Coletta Memos, FNP At [facility], you and your health needs are our priority.  As part of our continuing mission to provide you with exceptional heart care, we have created designated Provider Care Teams.  These Care Teams include your primary Cardiologist (physician) and Advanced Practice Providers (APPs -  Physician Assistants and Nurse Practitioners) who all work together to provide you with the care you need, when you need  it.  We recommend signing up for the patient portal called "MyChart".  Sign up information is provided on this After Visit Summary.  MyChart is used to connect with patients for Virtual Visits (Telemedicine).  Patients are able to view lab/test results, encounter notes, upcoming appointments, etc.  Non-urgent messages can be sent to your provider as well.   To learn more about what you can do with MyChart, go to [email or web address].

## 2020-01-12 ENCOUNTER — Ambulatory Visit: Payer: Medicaid Other | Attending: Nurse Practitioner | Admitting: Nurse Practitioner

## 2020-01-18 MED FILL — glipiZIDE XL 10 MG TB24: 10 | 90 days supply | Qty: 90 | Fill #1

## 2020-01-18 MED FILL — TORSEMIDE 20 MG TABLET: 20 | 30 days supply | Qty: 240 | Fill #0

## 2020-01-18 MED FILL — OXYBUTYNIN CL ER 15 MG TAB: 15 | 90 days supply | Qty: 90 | Fill #1

## 2020-01-18 MED FILL — CARVEDILOL 25 MG TABLET: 25 | 90 days supply | Qty: 180 | Fill #2

## 2020-01-18 MED FILL — PROAIR HFA 90 MCG INHALER: 108 (90 BAS | 25 days supply | Qty: 9 | Fill #0

## 2020-01-19 DIAGNOSIS — G8929 Other chronic pain: Secondary | ICD-10-CM | POA: Diagnosis not present

## 2020-01-19 DIAGNOSIS — I4819 Other persistent atrial fibrillation: Secondary | ICD-10-CM | POA: Diagnosis not present

## 2020-01-19 DIAGNOSIS — I10 Essential (primary) hypertension: Secondary | ICD-10-CM | POA: Diagnosis not present

## 2020-01-19 DIAGNOSIS — R079 Chest pain, unspecified: Secondary | ICD-10-CM | POA: Diagnosis not present

## 2020-01-19 DIAGNOSIS — I5033 Acute on chronic diastolic (congestive) heart failure: Secondary | ICD-10-CM | POA: Diagnosis not present

## 2020-01-19 DIAGNOSIS — Z79899 Other long term (current) drug therapy: Secondary | ICD-10-CM | POA: Diagnosis not present

## 2020-01-20 LAB — BASIC METABOLIC PANEL
BUN/Creatinine Ratio: 24 — ABNORMAL HIGH (ref 9–23)
BUN: 27 mg/dL — ABNORMAL HIGH (ref 6–24)
CO2: 27 mmol/L (ref 20–29)
Calcium: 9.8 mg/dL (ref 8.7–10.2)
Chloride: 96 mmol/L (ref 96–106)
Creatinine, Ser: 1.11 mg/dL — ABNORMAL HIGH (ref 0.57–1.00)
GFR calc Af Amer: 66 mL/min/{1.73_m2} (ref >59)
GFR calc non Af Amer: 58 mL/min/{1.73_m2} — ABNORMAL LOW (ref >59)
Glucose: 254 mg/dL — ABNORMAL HIGH (ref 65–99)
Potassium: 4.4 mmol/L (ref 3.5–5.2)
Sodium: 139 mmol/L (ref 134–144)

## 2020-01-22 ENCOUNTER — Other Ambulatory Visit: Payer: Self-pay

## 2020-01-22 ENCOUNTER — Encounter: Payer: Medicaid Other | Attending: Internal Medicine | Admitting: Dietician

## 2020-01-22 ENCOUNTER — Encounter: Payer: Self-pay | Admitting: Dietician

## 2020-01-22 DIAGNOSIS — E1165 Type 2 diabetes mellitus with hyperglycemia: Secondary | ICD-10-CM | POA: Diagnosis not present

## 2020-01-22 NOTE — Patient Instructions (Addendum)
For further information about weight loss surgery: [email or web address]  OR call [phone] Classes are in person or online.  Continue to avoid added salt. Reduce the oil in cooking. Salad with each dinner  Mindful eating: Eat slowly. Chew your food well. Stop when you are satisfied rather than overfull.  Be active.  Aim for 30 minutes most days.

## 2020-01-22 NOTE — Progress Notes (Addendum)
  Medical Nutrition Therapy:  Appt start time: 1115 end time:  1205   Assessment:  Primary concerns today: Patient is here today alone and would like to lose weight.  She states that she is interested in Bariatric surgery.  Labs:  A1C 7.6% decreased from 8.1% 05/22/2020  History includes:  Type 2 diabetes, CHF, bipolar, HTN, and OSA.  Weight: 236 lbs 01/22/2020 239 lbs 01/11/2020 244 lbs 01/04/2020  Patient lives with her mother and brother.  Her mother does the cooking. Her mother is receptive to cooking the way that [place] needs. They use an air fryer.  Patient states that cooking is not her "hobby".  Patient is not employed. Her brother had a heart attack 3 weeks ago and is hospitalized in a coma. Primary language is Arabic. 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  Preferred Learning Style:   No preference indicated   Learning Readiness:   Ready  Change in progress   MEDICATIONS: see list to include glipizide and metformin.  Insulin listed and only used during her hospitalization.   DIETARY INTAKE:  Avoided foods include dislikes all fruit except for strawberries and Mandarin oranges.   Currently fasting for Ramadan.  Food history is pre Ramadan. She noted that her creatinine had increased ("due to not drinking during the day" due to fasting and she states that she was given permission to drink). Follows a no added salt/low sodium diet 75% of the time and finds this challenging. She often eats until she is overfull. 24-hr recall:  B (9 AM): 2 boiled eggs, [employer] pita bread (1/2 or whole),  Cheese, tomato, Snk ( AM): occasional dates  L ( PM): sandwich on pita Snk ( PM):  D (6 PM): stew (chicken or beef, vegetables, potatoes), rice or salad with a peanut butter dressing Snk ( PM):  Beverages: water, Milk tea with cookies and cake (rare) diet coke (1  Bottle daily)  Limiting all fluid to 32-48 daily   Usual physical activity: none   Progress Towards  Goal(s):  In progress.   Nutritional Diagnosis:  NB-1.1 Food and nutrition-related knowledge deficit As related to balance of carbohydrate, protein, and fat.  As evidenced by diet hx and patient report.    Intervention:  Nutrition education related to balanced eating for diabetes, CHF and weight loss.  Reviewed importance of a low sodium diet.  Discussed sources of protein, carbohydrates, and fat and balance within her meals.  Recommended use of less oil in the cooking and continued low sodium.  Discussed benefits of low sodium eating.  Recommended increased activity most days.  Provided information regarding the bariatric class.  PLAN: For further information about weight loss surgery: [email or web address]  OR call [phone] Classes are in person or online.  Continue to avoid added salt. Reduce the oil in cooking. Salad with each dinner  Mindful eating: Eat slowly. Chew your food well. Stop when you are satisfied rather than overfull.  Be active.  Aim for 30 minutes most days.   Teaching Method Utilized:  Visual Auditory Hands on  Handouts given during visit include:  Meal plan card  My plate  Barriers to learning/adherence to lifestyle change: Arabic is her first language  Demonstrated degree of understanding via:  Teach Back   Monitoring/Evaluation:  Dietary intake, exercise, and body weight in 1 month(s). With one of the bariatric dietitians.

## 2020-01-24 NOTE — Progress Notes (Signed)
Cardiology Clinic Note   Patient Name: [name] Date of Encounter: 01/26/2020  Primary Care Provider:  [name], NP Primary Cardiologist:  [name], MD  Patient Profile    [name] 54 year old female presents today for follow-up of her chronic diastolic CHF.  Past Medical History    Past Medical History:  Diagnosis Date   Acute on chronic diastolic congestive heart failure ([place]) 11/02/2013   10/03/2015, 11/13/2015, 08/03/2017   Benign essential HTN 11/28/2013   Bipolar disease, chronic ([place])    Chest pain    a. 2012 Myoview: EF 63%, no isch/infarct;  b. 04/2016 Lexiscan MV: EF 73%, no ischemia/infarct-->Low risk.   Chronic diastolic CHF (congestive heart failure) ([place]) 07/23/2011   a. 2015 Echo: EF 55-60%, Gr2 DD;  b. 09/2015 Echo: EF 60-65%, no rwma, mod dil LA, PASP 74mHg.   Cor pulmonale (chronic) (HCC)    History of thyrotoxicosis    HTN (hypertension) 11/28/2013   Hypertensive heart disease 10/18/2013   Hypoglycemia    Insulin dependent type 2 diabetes mellitus, uncontrolled ([facility]    Mediastinal adenopathy    Morbid obesity due to excess calories ([facility] 02/19/2011   Morbid obesity with BMI of 50.0-59.9, adult (HCC)    OSA (obstructive sleep apnea) 03/06/2011   Persistent atrial fibrillation ([facility] 12/09/2017   Pulmonary HTN, moderate to severe 11/03/2013   Sinusitis, chronic 01/02/2015   SVT (supraventricular tachycardia) ([facility] 12/06/2013   Uncontrolled type 2 diabetes mellitus with hyperglycemia ([facility]    Past Surgical History:  Procedure Laterality Date   CARDIOVERSION N/A 04/05/2018   Procedure: CARDIOVERSION;  Surgeon: CLelon Perla MD;  [facility]  Service: Cardiovascular;  Laterality: N/A;   COLONOSCOPY WITH PROPOFOL Left 07/16/2018   Procedure: COLONOSCOPY WITH PROPOFOL;  Surgeon: KRonnette Juniper MD;  [facility];  Service: Gastroenterology;  Laterality: Left;   LEFT HEART CATH AND CORONARY ANGIOGRAPHY  N/A 08/04/2018   Procedure: LEFT HEART CATH AND CORONARY ANGIOGRAPHY;  Surgeon: SBelva Crome MD;  [facility];  Service: Cardiovascular;  Laterality: N/A;   None     POLYPECTOMY  07/16/2018   Procedure: POLYPECTOMY;  Surgeon: KRonnette Juniper MD;  [facility];  Service: Gastroenterology;;    Allergies  Allergies  Allergen Reactions   Acetaminophen Other (See Comments)    Seizure-like "fits" as a child   Caffeine     Tense, anxiety, increased urination   Farxiga [Dapagliflozin] Other (See Comments)    Hallucinations, drop in blood sugar   Lisinopril Rash    Rash with lisinopril; but fosinopril is ok per patient    History of Present Illness    Ms. KKriegelhas a PMH of chronic diastolic heart failure, NSTEMI, pulmonary hypertension moderate to severe, benign essential hypertension, cor pulmonale, persistent atrial fibrillation, OSA, interstitial lung disease, type 2 diabetes, acute kidney injury, bipolar disorder, and morbid obesity. She also has a history of SVT. Her echocardiogram 7/19 showed normal LV function, mild mitral regurgitation, mild bilateral enlargement and moderate tricuspid regurgitation. She was admitted with recurrent chest pain 11/19. Cardiac enzymes were positive. A follow-up limited study 11/19 showed normal LV function and biatrial enlargement. A CTA showed no pulmonary embolus. 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 Ejection fraction was 65%. Her LVEDP was 26 mmHg. The procedure was complicated by ventricular fibrillation requiring defibrillation. This was successful and resulted in sinus rhythm. She developed contrast neuropathy post procedure. She was last seen by Dr. CStanford Breed  March 14, 2019. During that time she denied dyspnea. States she had occasional chest pain that was relieved by rubbing ointment on her chest. She was also noted to have bilateral lower extremity  edema and denied syncope. She was in atrial fibrillation at that time, her beta-blocker was continued as well as her Xarelto.  She recently presented to the emergency department on 12/30/2019 with atypical chest pain. She was ruled out for MI, troponins negative x2, D-dimer not elevated, CXR showed fluid volume overload. Her furosemide was switched to torsemide and she was instructed to follow-up with her PCP and cardiology.  She presented to the clinic 01/04/20 and statedshe did not take her torsemide medication because she was worried about switching from her furosemide. Her weight was 344 pounds. She stated she did have dyspnea with activity. She had been trying to restrict her fluids and had been drinking around 120 mL a day which is down from over 3 L. She also stated she did not like to wear lower extremity support stockings. I  explained the benefits of switching medication to her and her brother. She was very anxious about medication change but understood the risks of hospital admission and after much discussion has decided to switch to torsemide rather than return to the hospital. I  had her take torsemide 80 mg twice daily for 1 week, have a BMP in 1 week, give her the salty 6 diet, further restrict her fluids to 32-48 ounces daily and repeat a BMP in 1 week.  She presented to the clinic 01/11/20 for follow-up and stated she was starting to feel better this week.  She had almost 6 pounds.  She stated that she had not been very active in the last week.  She was spending a lot of time in bed going back and forth to the bathroom.  She was trying to eat a low-sodium diet and had been monitoring her weight.  She did feel that in the past her fluid had come off faster.  I  continued her torsemide for another 2 weeks at the current dose and potassium at the current dose.  Planned Follow-up for 2 weeks and  BMP in 2 weeks.  She presents the clinic today for follow-up and states she feels about  the same as she did during her prior visit.  She states that her breathing is not worse.  She states that with Ramadan she has skipped some of her morning doses of torsemide.  She asks if she needs to fast while we are managing her medical problems.  I have instructed her that she should not this doses of her diuresis.  Her creatinine is slightly elevated with last draw and her dosing was decreased to 60 mg twice daily.  I will continue this dose, check a BMP today, give her the salty 6, have reinforced fluid restriction, and we will have her follow-up in 1 month.  Today shedenies chest pain, increased lower extremity edema,palpitations, melena, hematuria, hemoptysis, diaphoresis, weakness, presyncope, syncope, and PND.  Home Medications    Prior to Admission medications   Medication Sig Start Date End Date Taking? Authorizing Provider  Accu-Chek Softclix Lancets lancets USE AS DIRECTED TO TEST BLOOD SUGAR ONCE DAILY 05/23/19   [name], NP  albuterol [facility] HFA) 108 (90 Base) MCG/ACT inhaler INHALE 1 TO 2 PUFFS EVERY 6 HOURS AS NEEDED FOR WHEEZING/ SHORTNESS OF BREATH 11/07/19   [name], NP  amLODipine (NORVASC) 10 MG tablet Take 10  mg by mouth daily.    [provider]  atorvastatin (LIPITOR) 40 MG tablet Take 1 tablet (40 mg total) by mouth daily at 6 PM. Patient not taking: Reported on 01/22/2020 05/23/19   [name], NP  Blood Glucose Monitoring Suppl (ACCU-CHEK AVIVA PLUS) w/Device KIT 1 each by Does not apply route daily. USE AS DIRECTED TO TEST BLOOD SUGAR ONCE DAILY 08/23/18   [name], NP  Blood Glucose Monitoring Suppl (TRUE METRIX METER) w/Device KIT Use as instructed. Check blood glucose levels by fingerstick twice per day 08/16/18   [name], NP  carvedilol (COREG) 25 MG tablet Take 25 mg by mouth 2 (two) times daily with a meal.    [provider]  escitalopram (LEXAPRO) 10 MG tablet 1/2 ab PO daily x 2 wks then 1 tab PO  daily Patient taking differently: Take 10 mg by mouth daily.  07/13/19   Ladell Pier, MD  ferrous sulfate (FEROSUL) 325 (65 FE) MG tablet Take 1 tablet (325 mg total) by mouth 2 (two) times daily. Patient not taking: Reported on 01/22/2020 10/04/18   Erlene Quan, PA-C  glipiZIDE (GLUCOTROL XL) 10 MG 24 hr tablet Take 10 mg by mouth every evening.    [provider]  glucose blood (ACCU-CHEK AVIVA PLUS) test strip USE AS DIRECTED TO TEST BLOOD SUGAR ONCE DAILY 05/23/19   [name], NP  insulin detemir (LEVEMIR) 100 unit/ml SOLN Inject 0.2 mLs (20 Units total) into the skin daily. Patient not taking: Reported on 01/22/2020 12/19/19   Allie Bossier, MD  Insulin Pen Needle 29G X 12MM MISC Use per instructions 12/19/19   Allie Bossier, MD  metFORMIN (GLUCOPHAGE) 1000 MG tablet Take 1 tablet (1,000 mg total) by mouth 2 (two) times daily with a meal. 07/18/19   [name], NP  Misc. Devices MISC Please provide BiPAP machine with the following: Settings 22/18 cm H2O. Medium  size [employer] Full Face Mask Simplus mask and heated  humidification. 11/07/19   [name], NP  oxybutynin (DITROPAN) 5 MG tablet Take 5 mg by mouth 3 (three) times daily.    [provider]  Potassium Chloride ER 20 MEQ TBCR Take 20 mEq by mouth 2 (two) times daily. 05/23/19   [name], NP  risperidone (RISPERDAL) 4 MG tablet Take 1 tablet (4 mg total) by mouth daily. 07/18/19   [name], NP  rivaroxaban (XARELTO) 20 MG TABS tablet TAKE 1 TABLET (20 MG TOTAL) BY MOUTH DAILY WITH SUPPER. 07/18/19   [name], NP  torsemide (DEMADEX) 20 MG tablet TAKE 4 TABLETS (80 MG TOTAL) BY MOUTH 2 (TWO) TIMES DAILY. 01/11/20   Deberah Pelton, NP  furosemide (LASIX) 80 MG tablet Take 1 tablet (80 mg total) by mouth in the morning, at noon, and at bedtime. 12/19/19 12/30/19  Allie Bossier, MD    Family History    Family History  Problem Relation Age of Onset   Heart  failure Father    Stroke Father    Hypertension Mother    Heart disease Maternal Grandfather    She indicated that her mother is alive. She indicated that her father is deceased. She reported the following about her brother: HTN. She indicated that her maternal grandmother is deceased. She indicated that her maternal grandfather is deceased. She indicated that her paternal grandmother is deceased. She indicated that her paternal grandfather is deceased.  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 But ran out of money and is no longer attending school. (Note patient has bipolar disorder).      Born in [place] but lived in [place] before coming back to [place].       Primary language is Arabic. Lives with mother and brother.   Social Determinants of Health   Financial Resource Strain:    Difficulty of Paying Living Expenses:   Food Insecurity:    Worried About Charity fundraiser in the Last Year:    Arboriculturist in the Last Year:   Transportation Needs:    Film/video editor (Medical):    Lack of Transportation (Non-Medical):   Physical Activity:    Days of Exercise per Week:    Minutes of Exercise per Session:   Stress:    Feeling of Stress :   Social Connections:    Frequency of Communication with Friends and Family:    Frequency of Social Gatherings with Friends  and Family:    Attends Religious Services:    Active Member of Clubs or Organizations:    Attends Music therapist:    Marital Status:   Intimate Partner Violence:    Fear of Current or Ex-Partner:    Emotionally Abused:    Physically Abused:    Sexually Abused:      Review of Systems    General:  No chills, fever, night sweats or weight changes.  Cardiovascular:  No chest pain, dyspnea on exertion, edema, orthopnea, palpitations, paroxysmal nocturnal dyspnea. Dermatological: No rash, lesions/masses Respiratory: No cough, dyspnea Urologic: No hematuria, dysuria Abdominal:   No nausea, vomiting, diarrhea, bright red blood per rectum, melena, or hematemesis Neurologic:  No visual changes, wkns, changes in mental status. All other systems reviewed and are otherwise negative except as noted above.  Physical Exam    VS:  BP 130/70    Pulse 94    Temp (!) 97 F (36.1 C)    Ht _0  (1.702 m)    Wt (!) 337 lb 3.2 oz (153 kg)    SpO2 98%    BMI 52.81 kg/m  , BMI Body mass index is 52.81 kg/m. GEN: Well nourished, well developed, in no acute distress. HEENT: normal. Neck: Supple, no JVD, carotid bruits, or masses. Cardiac: RRR, no murmurs, rubs, or gallops. No clubbing, cyanosis, +1 pitting bilateral lower extremity edema.  Radials/DP/PT 2+ and equal bilaterally.  Respiratory:  Respirations regular and unlabored, clear to auscultation bilaterally. GI: Soft, nontender, nondistended, BS + x 4. MS: no deformity or atrophy. Skin: warm and dry, no rash. Neuro:  Strength and sensation are intact. Psych: Normal affect.  Accessory Clinical Findings    ECG personally reviewed by me today- none today.  EKG 01/01/2020 Atrial fibrillation 90 bpm  Echocardiogram 12/09/2019 IMPRESSIONS    1. Left ventricular ejection fraction, by estimation, is 65 to 70%. The  left ventricle has hyperdynamic function. The left ventricle has no  regional wall motion abnormalities.  Left ventricular diastolic function  could not be evaluated. Left ventricular  diastolic function could not be evaluated.  2. Right ventricular systolic function is normal. The right ventricular  size is normal. There is moderately elevated pulmonary artery systolic  pressure. The estimated right ventricular systolic pressure is 33.5 mmHg.  3. Left atrial size was mildly dilated.  4. Right atrial size was mildly dilated.  5. The mitral valve is normal in structure. Mild mitral valve  regurgitation. No evidence of mitral stenosis.  6. The aortic valve is normal in structure. Aortic valve regurgitation is  not visualized. No aortic stenosis is present.  7. The inferior vena cava is normal in size with greater than 50%  respiratory variability, suggesting right atrial pressure of 3 mmHg.  Cardiac catheterization 08/04/2018  Widely patent coronary arteries.  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 Courses between PA and AO. Mild dynamic compression is noted.  Hypercontractile left ventricle. Marked elevation in LVEDP, 26 mmHg. EF greater than 65%.  Ventricular fibrillation treated with defibrillation x1.  Conversion to normal sinus rhythm after defibrillation.  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  Resume anticoagulation, likely Xarelto since coronaries are clean.  Further management per treating team.  Discontinue nitroglycerin IV.  Recommend to resume Rivaroxaban, at currently prescribed dose and frequency  Assessment & Plan   1.  Acute on chronic diastolic heart failure/atypical chest pain-presented to the emergency department on 12/30/2019 with atypical chest pain and pain through her shoulder blades. Chest x-ray showed fluid volume overload. Her furosemide was switched to torsemide.She did not begin her torsemide due to anxiety of the medication.  She  transition to torsemide  last week.  And is down to 337 pounds from 344 pounds 01/04/20.  She has missed some of her torsemide doses due to Ramadan.  I have instructed her to not skip doses of her diuresis. Continuetorsemide 60 mg twice daily Continue potassium chloride 20 mEq twice daily Continue carvedilol 25 mg twice daily Continue amlodipine 10 mg daily Heart healthy low-sodium diet-salty 6 given Increase physical activity as tolerated Daily weights Continue fluid restriction 64 oz daily Lower extremity support stockings-again recommended Elevate extremities when not active Order BMP  Chronic chest pain/anomalous origin of RCA-cardiac catheterization 11/19 showed normal coronary arteries Continue medical management  Persistent atrial fibrillation-heart rate today 94 Continue carvedilol 25 mg twice daily Continue Xarelto 20 mg daily  Essential hypertension-BP today 130/70 Continue carvedilol 25 mg twice daily Heart healthy low-sodium diet-salty 6 given Increase physical activity as tolerated  Morbid obesity-weight today 337 pounds down from344 pounds 01/04/20   Heart healthy low-sodium diet Increase physical activity as tolerated Continue weight loss  Disposition: Follow-up with me in 1 month.  Jossie Ng. Grisell Bissette NP-C    01/26/2020, 1:30 PM [place] [address] [address] Office [phone] Fax [phone]

## 2020-01-26 ENCOUNTER — Other Ambulatory Visit: Payer: Self-pay

## 2020-01-26 ENCOUNTER — Encounter: Payer: Self-pay | Admitting: General Practice

## 2020-01-26 ENCOUNTER — Ambulatory Visit (INDEPENDENT_AMBULATORY_CARE_PROVIDER_SITE_OTHER): Payer: Medicaid Other | Admitting: General Practice

## 2020-01-26 VITALS — BP 130/70 | HR 94 | Temp 97.0°F | Ht 67.0 in | Wt 337.2 lb

## 2020-01-26 DIAGNOSIS — I4819 Other persistent atrial fibrillation: Secondary | ICD-10-CM

## 2020-01-26 DIAGNOSIS — I10 Essential (primary) hypertension: Secondary | ICD-10-CM | POA: Diagnosis not present

## 2020-01-26 DIAGNOSIS — I5033 Acute on chronic diastolic (congestive) heart failure: Secondary | ICD-10-CM

## 2020-01-26 DIAGNOSIS — R079 Chest pain, unspecified: Secondary | ICD-10-CM

## 2020-01-26 DIAGNOSIS — G8929 Other chronic pain: Secondary | ICD-10-CM | POA: Diagnosis not present

## 2020-01-26 NOTE — Patient Instructions (Addendum)
Medication Instructions:   Your physician recommends that you continue on your current medications as directed. Please refer to the Current Medication list given to you today.  *If you need a refill on your cardiac medications before your next appointment, please call your pharmacy*   Lab Work: BMET ONE [place] UP   If you have labs (blood work) drawn today and your tests are completely normal, you will receive your results only by: Marland Kitchen MyChart Message (if you have MyChart) OR . A paper copy in the mail If you have any lab test that is abnormal or we need to change your treatment, we will call you to review the results.   Testing/Procedures:  NONE ORDERED  TODAY   Follow-Up: At [facility], you and your health needs are our priority.  As part of our continuing mission to provide you with exceptional heart care, we have created designated Provider Care Teams.  These Care Teams include your primary Cardiologist (physician) and Advanced Practice Providers (APPs -  Physician Assistants and Nurse Practitioners) who all work together to provide you with the care you need, when you need it.  We recommend signing up for the patient portal called "MyChart".  Sign up information is provided on this After Visit Summary.  MyChart is used to connect with patients for Virtual Visits (Telemedicine).  Patients are able to view lab/test results, encounter notes, upcoming appointments, etc.  Non-urgent messages can be sent to your provider as well.   To learn more about what you can do with MyChart, go to [email or web address].    Your next appointment:   1 month(s)  The format for your next appointment:   In Person  Provider:   You may see [name], MD  or one of the following Advanced Practice Providers on your designated Care Team:  Coletta Memos, FNP   Other Instructions:  DRINK 4 BOTTLES OF WATER  64 (OZ)

## 2020-01-27 LAB — BASIC METABOLIC PANEL
BUN/Creatinine Ratio: 14 (ref 9–23)
BUN: 16 mg/dL (ref 6–24)
CO2: 24 mmol/L (ref 20–29)
Calcium: 9.4 mg/dL (ref 8.7–10.2)
Chloride: 96 mmol/L (ref 96–106)
Creatinine, Ser: 1.15 mg/dL — ABNORMAL HIGH (ref 0.57–1.00)
GFR calc Af Amer: 64 mL/min/{1.73_m2} (ref >59)
GFR calc non Af Amer: 55 mL/min/{1.73_m2} — ABNORMAL LOW (ref >59)
Glucose: 381 mg/dL — ABNORMAL HIGH (ref 65–99)
Potassium: 4.2 mmol/L (ref 3.5–5.2)
Sodium: 138 mmol/L (ref 134–144)

## 2020-02-01 ENCOUNTER — Telehealth: Payer: Self-pay | Admitting: General Practice

## 2020-02-01 NOTE — Telephone Encounter (Signed)
Spoke with patient, results reviewed for recent lab work. Patient verbalized understanding.

## 2020-02-01 NOTE — Telephone Encounter (Signed)
   Pt returning call regarding Lab results

## 2020-02-01 NOTE — Telephone Encounter (Signed)
Left message for patient to call back  

## 2020-02-19 ENCOUNTER — Other Ambulatory Visit: Payer: Self-pay

## 2020-02-19 ENCOUNTER — Telehealth: Payer: Self-pay

## 2020-02-19 ENCOUNTER — Encounter: Payer: Medicaid Other | Attending: Internal Medicine | Admitting: Dietician

## 2020-02-19 DIAGNOSIS — E1165 Type 2 diabetes mellitus with hyperglycemia: Secondary | ICD-10-CM | POA: Insufficient documentation

## 2020-02-19 NOTE — Telephone Encounter (Signed)
Pt wants to pick up prescription for BIPAP machine. Call Pt ph [phone].

## 2020-02-19 NOTE — Progress Notes (Signed)
Medical Nutrition Therapy  Follow-Up Visit Appt Start Time: 2:15pm    End Time: 2:50pm  Patient was originally seen on 01/22/2020 for diabetes and weight management.  Primary concerns today: weight management, considering bariatric surgery    NUTRITION ASSESSMENT   Anthropometrics  Weight: 333.4 lbs  (decreased from 336 lbs on 01/22/2020) BMI: 52.2   Clinical Medical Hx: T2DM, CHF, bipolar, HTN, OSA Labs: A1c 7.6% (01/22/2020)  Lifestyle & Dietary Hx Patient lives with her mother and brother, her mother was present during today's visit. Patient states she is interested in bariatric surgery but is worried about the "side effects" of it. Patient's mother states that the patient has an eating disorder and eats a lot of food at meal times. Typical meal pattern is 3 meals per day and maybe a snack. States she tries to follow a low sodium diet and that this is going well. Checks her blood sugar daily; most recent reading was 145 mg/dL 2 hours after eating. Patient's mother states she would like for patient to be in a group setting to provide patient with encouragement and accountability to lose weight.   24-Hr Dietary Recall First Meal: 2 eggs + bread + cheese Snack: - Second Meal: vegetable beef stew + bread  Snack: cookies  Third Meal: cookies + milk  Snack: -  Estimated Energy Needs Calories: 1600 Carbohydrate: 180g Protein: 100g Fat: 53g   NUTRITION DIAGNOSIS  01/22/20: Food and nutrition related knowledge deficit (NB-1.1) related to imbalance of carbohydrates/protein/fat as evidenced by dietary hx and patient report Status: Continue   NUTRITION INTERVENTION  Nutrition education (E-1) including balanced eating for diabetes and weight management. Reviewed basic principals of bariatric nutrition education per patient request, and advised bariatric surgery may not be approved (by insurance, Psychologist, sport and exercise, etc.) and may not be the most appropriate option especially considering reported  eating disorder by patient's mother. Recommended starting with lifestyle changes for weight management including following MyPlate method, focusing on non-starchy vegetables at meal times as well as balancing with complex carbohydrates and lean protein. Recommended practicing mindful eating (chewing thoroughly, eating slowly, stopping when satisfied) and eating off of smaller plates to help reduce portion sizes at meal times.   Handouts Provided Include   MyPlate Portions   Meal Ideas  Support Group Flyers- T2DM and Bariatric Surgery  Learning Style & Readiness for Change Teaching method utilized: Visual & Auditory  Demonstrated degree of understanding via: Teach Back  Barriers to learning/adherence to lifestyle change: Contemplative Stage of Change, Overeating, Arabic as Primary Language   Progress/ Updates on Pt's Goals . Continue avoiding added salt . Continue working on including a salad with dinner . Continue practicing eating mindfully (eating slowly, chewing food well, stopping when satisfied rather than when full)  . NEW: Follow the MyPlate and Meal Ideas sheets to build balanced lunch and dinner meals . NEW: Consider joining the T2DM and/or Bariatric Support Groups    MONITORING & EVALUATION Dietary intake, weekly physical activity, and goals in 3-4 weeks.  Next Steps  Patient is to return to [facility] for follow up visit in 3-4 weeks.

## 2020-02-20 ENCOUNTER — Encounter: Payer: Self-pay | Admitting: Dietician

## 2020-02-20 NOTE — Patient Instructions (Signed)
   Continue avoiding added salt  Continue working on including a salad with dinner  Continue practicing eating mindfully (eating slowly, chewing food well, stopping when satisfied rather than when full)    Follow the MyPlate and Meal Ideas sheets to build balanced lunch and dinner meals  Consider joining the T2DM and/or Bariatric Support Groups

## 2020-02-22 ENCOUNTER — Ambulatory Visit: Payer: Medicaid Other | Attending: Internal Medicine

## 2020-02-22 DIAGNOSIS — Z23 Encounter for immunization: Secondary | ICD-10-CM

## 2020-02-22 NOTE — Progress Notes (Signed)
   Covid-19 Vaccination Clinic  Name:  [name]    MRN: [id number] DOB: 1966-06-08  02/22/2020  [name] was observed post Covid-19 immunization for 15 minutes without incident. She was provided with Vaccine Information Sheet and instruction to access the V-Safe system.   [name] was instructed to call 911 with any severe reactions post vaccine: Marland Kitchen Difficulty breathing  . Swelling of face and throat  . A fast heartbeat  . A bad rash all over body  . Dizziness and weakness   Immunizations Administered    Name Date Dose VIS Date Route   Pfizer COVID-19 Vaccine 02/22/2020  2:15 PM 0.3 mL 11/22/2018 Intramuscular   Manufacturer: [place]   Lot: [id number]   [place]: [id number]

## 2020-02-26 NOTE — Progress Notes (Signed)
Cardiology Clinic Note   Patient Name: [name] Date of Encounter: 02/27/2020  Primary Care Provider:  [name], NP Primary Cardiologist:  [name], MD  Patient Profile    [name] 54 year old female presents today for follow-up of her chronic diastolic CHF.  Past Medical History    Past Medical History:  Diagnosis Date  . Acute on chronic diastolic congestive heart failure ([place]) 11/02/2013   10/03/2015, 11/13/2015, 08/03/2017  . Benign essential HTN 11/28/2013  . Bipolar disease, chronic ([place])   . Chest pain    a. 2012 Myoview: EF 63%, no isch/infarct;  b. 04/2016 Lexiscan MV: EF 73%, no ischemia/infarct-->Low risk.  . Chronic diastolic CHF (congestive heart failure) ([place]) 07/23/2011   a. 2015 Echo: EF 55-60%, Gr2 DD;  b. 09/2015 Echo: EF 60-65%, no rwma, mod dil LA, PASP 101mHg.  . Cor pulmonale (chronic) ([place]   . History of thyrotoxicosis   . HTN (hypertension) 11/28/2013  . Hypertensive heart disease 10/18/2013  . Hypoglycemia   . Insulin dependent type 2 diabetes mellitus, uncontrolled ([place]   . Mediastinal adenopathy   . Morbid obesity due to excess calories ([facility] 02/19/2011  . Morbid obesity with BMI of 50.0-59.9, adult ([place]   . OSA (obstructive sleep apnea) 03/06/2011  . Persistent atrial fibrillation ([facility] 12/09/2017  . Pulmonary HTN, moderate to severe 11/03/2013  . Sinusitis, chronic 01/02/2015  . SVT (supraventricular tachycardia) ([place] 12/06/2013  . Uncontrolled type 2 diabetes mellitus with hyperglycemia ([facility]    Past Surgical History:  Procedure Laterality Date  . CARDIOVERSION N/A 04/05/2018   Procedure: CARDIOVERSION;  Surgeon: CLelon Perla MD;  [facility];  Service: Cardiovascular;  Laterality: N/A;  . COLONOSCOPY WITH PROPOFOL Left 07/16/2018   Procedure: COLONOSCOPY WITH PROPOFOL;  Surgeon: KRonnette Juniper MD;  Location: [facility] ENDOSCOPY;  Service: Gastroenterology;  Laterality: Left;  . LEFT HEART CATH AND CORONARY ANGIOGRAPHY  N/A 08/04/2018   Procedure: LEFT HEART CATH AND CORONARY ANGIOGRAPHY;  Surgeon: SBelva Crome MD;  [facility];  Service: Cardiovascular;  Laterality: N/A;  . None    . POLYPECTOMY  07/16/2018   Procedure: POLYPECTOMY;  Surgeon: KRonnette Juniper MD;  Location: [facility] ENDOSCOPY;  Service: Gastroenterology;;    Allergies  Allergies  Allergen Reactions  . Acetaminophen Other (See Comments)    Seizure-like "fits" as a child  . Caffeine     Tense, anxiety, increased urination  . FIran[Dapagliflozin] Other (See Comments)    Hallucinations, drop in blood sugar  . Lisinopril Rash    Rash with lisinopril; but fosinopril is ok per patient    History of Present Illness    [name] a PMH of chronic diastolic heart failure, NSTEMI, pulmonary hypertension moderate to severe, benign essential hypertension, cor pulmonale, persistent atrial fibrillation, OSA, interstitial lung disease, type 2 diabetes, acute kidney injury, bipolar disorder, and morbid obesity. She also has a history of SVT. Her echocardiogram 7/19 showed normal LV function, mild mitral regurgitation, mild bilateral enlargement and moderate tricuspid regurgitation. She was admitted with recurrent chest pain 11/19. Cardiac enzymes were positive. A follow-up limited study 11/19 showed normal LV function and biatrial enlargement. A CTA showed no pulmonary embolus. 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 Ejection fraction was 65%. Her LVEDP was 26 mmHg. The procedure was complicated by ventricular fibrillation requiring defibrillation. This was successful and resulted in sinus rhythm. She developed contrast neuropathy post procedure. She was last seen by Dr. CStanford Breed  March 14, 2019. During that time she denied dyspnea. States she had occasional chest pain that was relieved by rubbing ointment on her chest. She was also noted to have bilateral lower extremity  edema and denied syncope. She was in atrial fibrillation at that time, her beta-blocker was continued as well as her Xarelto.  She recently presented to the emergency department on 12/30/2019 with atypical chest pain. She was ruled out for MI, troponins negative x2, D-dimer not elevated, CXR showed fluid volume overload. Her furosemide was switched to torsemide and she was instructed to follow-up with her PCP and cardiology.  She presented tothe clinic 4/8/21and statedshe did not take her torsemide medication because she was worried about switching from her furosemide. Her weightwas344 pounds. She statedshe didhave dyspnea with activity. She hadbeen trying to restrict her fluids and hadbeen drinking around 120 mL a day which is down from over 3 L. She also statedshe didnot like to wear lower extremity support stockings. I explained the benefits of switching medication to her and her brother. Shewasvery anxious about medication change but understoodthe risks of hospital admission and after much discussion has decided to switch to torsemide rather than return to the hospital. I hadher take torsemide 80 mg twice daily for 1 week, have a BMP in 1 week, give her the salty 6 diet, further restrict her fluids to 32-48 ounces daily and repeat a BMP in 1 week.  She presented to the clinic 01/11/20 for follow-up and statedshe was starting to feel better this week. She had lost almost 6 pounds. She stated that she had not been very active in the last week. She was spending a lot of time in bed going back and forth to the bathroom.   She presented the clinic 01/26/2020 for follow-up and stated she felt about the same as she did during her prior visit.  She stated that her breathing was not worse.  She stated that with Ramadan she had skipped some of her morning doses of torsemide.  She asked if she needed to fast while we are managing her medical problems.  I had instructed her that she  should not miss doses of her diuretic.  Her creatinine was slightly elevated with last draw and her dosing was decreased to 60 mg twice daily.  I  continued this dose, checked a BMP , gave her the salty 6,  reinforced fluid restriction, and planned follow-up in 1 month.  Creatinine 1.15 on 01/26/2020.  She presents to the clinic today for follow-up evaluation and states she climbed the stairs to the office today.  She feels that she is doing somewhat better with her lower extremity edema.  She feels that her breathing is much better as well.  She states that her brother passed away in 2023-02-23 and she has been having a hard time grieving this loss.  She continues to consume around 120 mL of fluid daily.  We have discussed fluid restriction and the benefit of not consuming as much fluid.  I have given her strategies for not drinking as much using sugar-free gum sugar-free candy instead of drinking fluid.  She agrees with plan and states she will try to decrease her fluid intake.  I will order a BMP today and have her follow-up with Dr. [name] in 3 months.  Today shedenies chest pain, increased lower extremity edema,palpitations, melena, hematuria, hemoptysis, diaphoresis, weakness, presyncope, syncope, and PND.  Home Medications    Prior to Admission medications   Medication Sig  Start Date End Date Taking? Authorizing Provider  Accu-Chek Softclix Lancets lancets USE AS DIRECTED TO TEST BLOOD SUGAR ONCE DAILY 05/23/19   [name], NP  albuterol [facility] HFA) 108 (90 Base) MCG/ACT inhaler INHALE 1 TO 2 PUFFS EVERY 6 HOURS AS NEEDED FOR WHEEZING/ SHORTNESS OF BREATH 11/07/19   [name], NP  amLODipine (NORVASC) 10 MG tablet Take 10 mg by mouth daily.    [provider]  atorvastatin (LIPITOR) 40 MG tablet Take 1 tablet (40 mg total) by mouth daily at 6 PM. 05/23/19   [name], NP  Blood Glucose Monitoring Suppl (ACCU-CHEK AVIVA PLUS) w/Device KIT 1 each by Does not apply route  daily. USE AS DIRECTED TO TEST BLOOD SUGAR ONCE DAILY 08/23/18   [name], NP  Blood Glucose Monitoring Suppl (TRUE METRIX METER) w/Device KIT Use as instructed. Check blood glucose levels by fingerstick twice per day 08/16/18   [name], NP  carvedilol (COREG) 25 MG tablet Take 25 mg by mouth 2 (two) times daily with a meal.    [provider]  escitalopram (LEXAPRO) 10 MG tablet 1/2 ab PO daily x 2 wks then 1 tab PO daily Patient taking differently: Take 10 mg by mouth daily.  07/13/19   Ladell Pier, MD  ferrous sulfate (FEROSUL) 325 (65 FE) MG tablet Take 1 tablet (325 mg total) by mouth 2 (two) times daily. 10/04/18   Erlene Quan, PA-C  glipiZIDE (GLUCOTROL XL) 10 MG 24 hr tablet Take 10 mg by mouth every evening.    [provider]  glucose blood (ACCU-CHEK AVIVA PLUS) test strip USE AS DIRECTED TO TEST BLOOD SUGAR ONCE DAILY 05/23/19   [name], NP  metFORMIN (GLUCOPHAGE) 1000 MG tablet Take 1 tablet (1,000 mg total) by mouth 2 (two) times daily with a meal. 07/18/19   [name], NP  Misc. Devices MISC Please provide BiPAP machine with the following: Settings 22/18 cm H2O. Medium  size [employer] Full Face Mask Simplus mask and heated  humidification. 11/07/19   [name], NP  oxybutynin (DITROPAN) 5 MG tablet Take 5 mg by mouth 3 (three) times daily.    [provider]  Potassium Chloride ER 20 MEQ TBCR Take 20 mEq by mouth 2 (two) times daily. 05/23/19   [name], NP  risperidone (RISPERDAL) 4 MG tablet Take 1 tablet (4 mg total) by mouth daily. 07/18/19   [name], NP  rivaroxaban (XARELTO) 20 MG TABS tablet TAKE 1 TABLET (20 MG TOTAL) BY MOUTH DAILY WITH SUPPER. 07/18/19   [name], NP  torsemide (DEMADEX) 20 MG tablet TAKE 4 TABLETS (80 MG TOTAL) BY MOUTH 2 (TWO) TIMES DAILY. 01/11/20   Deberah Pelton, NP  furosemide (LASIX) 80 MG tablet Take 1 tablet (80 mg total) by mouth in the morning,  at noon, and at bedtime. 12/19/19 12/30/19  Allie Bossier, MD    Family History    Family History  Problem Relation Age of Onset  . Heart failure Father   . Stroke Father   . Hypertension Mother   . Heart disease Maternal Grandfather    She indicated that her mother is alive. She indicated that her father is deceased. She reported the following about her brother: HTN. She indicated that her maternal grandmother is deceased. She indicated that her maternal grandfather is deceased. She indicated that her paternal grandmother is deceased. She indicated that  her paternal grandfather is deceased.  Social History    Social History   Socioeconomic History  . Marital status: Single    Spouse name: Not on file  . Number of children: 0  . Years of education: 29  . Highest education level: Not on file  Occupational History  . Occupation: unemployed  Tobacco Use  . Smoking status: Never Smoker  . Smokeless tobacco: Never Used  Substance and Sexual Activity  . Alcohol use: No  . Drug use: No  . Sexual activity: Not Currently    Birth control/protection: None  Other Topics Concern  . 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 But ran out of money and is no longer attending school. (Note patient has bipolar disorder).      Born in [place] but lived in [place] before coming back to [place].       Primary language is Arabic. Lives with mother and brother.   Social Determinants of Health   Financial Resource Strain:   . Difficulty of Paying Living Expenses:   Food Insecurity:   . Worried About Charity fundraiser in the Last Year:   . Arboriculturist in the Last Year:   Transportation Needs:   . Film/video editor (Medical):   Marland Kitchen Lack of Transportation (Non-Medical):   Physical Activity:   . Days of Exercise per Week:   . Minutes of Exercise per Session:   Stress:   .  Feeling of Stress :   Social Connections:   . Frequency of Communication with Friends and Family:   . Frequency of Social Gatherings with Friends and Family:   . Attends Religious Services:   . Active Member of Clubs or Organizations:   . Attends Archivist Meetings:   Marland Kitchen Marital Status:   Intimate Partner Violence:   . Fear of Current or Ex-Partner:   . Emotionally Abused:   Marland Kitchen Physically Abused:   . Sexually Abused:      Review of Systems    General:  No chills, fever, night sweats or weight changes.  Cardiovascular:  No chest pain, dyspnea on exertion, edema, orthopnea, palpitations, paroxysmal nocturnal dyspnea. Dermatological: No rash, lesions/masses Respiratory: No cough, dyspnea Urologic: No hematuria, dysuria Abdominal:   No nausea, vomiting, diarrhea, bright red blood per rectum, melena, or hematemesis Neurologic:  No visual changes, wkns, changes in mental status. All other systems reviewed and are otherwise negative except as noted above.  Physical Exam    VS:  BP 132/88 (BP Location: Left Arm, Patient Position: Sitting, Cuff Size: Large)   Pulse 97   Temp 98.1 F (36.7 C)   Ht '5\' 7"'  (1.702 m)   Wt (!) 340 lb (154.2 kg)   BMI 53.25 kg/m  , BMI Body mass index is 53.25 kg/m. GEN: Well nourished, well developed, in no acute distress. HEENT: normal. Neck: Supple, no JVD, carotid bruits, or masses. Cardiac: RRR, no murmurs, rubs, or gallops. No clubbing, cyanosis, edema.  Radials/DP/PT 2+ and equal bilaterally.  Respiratory:  Respirations regular and unlabored, clear to auscultation bilaterally. GI: Soft, nontender, nondistended, BS + x 4. MS: no deformity or atrophy. Skin: warm and dry, no rash. Neuro:  Strength and sensation are intact. Psych: Normal affect.  Accessory Clinical Findings    ECG personally reviewed by me today-none today.  EKG 01/01/2020  Atrial fibrillation 90 bpm  Echocardiogram 12/09/2019 IMPRESSIONS    1. Left ventricular  ejection fraction, by estimation, is 65 to 70%. The  left ventricle has hyperdynamic function. The left ventricle has no  regional wall motion abnormalities. Left ventricular diastolic function  could not be evaluated. Left ventricular  diastolic function could not be evaluated.  2. Right ventricular systolic function is normal. The right ventricular  size is normal. There is moderately elevated pulmonary artery systolic  pressure. The estimated right ventricular systolic pressure is 15.6 mmHg.  3. Left atrial size was mildly dilated.  4. Right atrial size was mildly dilated.  5. The mitral valve is normal in structure. Mild mitral valve  regurgitation. No evidence of mitral stenosis.  6. The aortic valve is normal in structure. Aortic valve regurgitation is  not visualized. No aortic stenosis is present.  7. The inferior vena cava is normal in size with greater than 50%  respiratory variability, suggesting right atrial pressure of 3 mmHg.  Cardiac catheterization 08/04/2018  Widely patent coronary arteries.  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 Courses between PA and AO. Mild dynamic compression is noted.  Hypercontractile left ventricle. Marked elevation in LVEDP, 26 mmHg. EF greater than 65%.  Ventricular fibrillation treated with defibrillation x1.  Conversion to normal sinus rhythm after defibrillation.  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  Resume anticoagulation, likely Xarelto since coronaries are clean.  Further management per treating team.  Discontinue nitroglycerin IV.  Recommend to resume Rivaroxaban, at currently prescribed dose and frequency   Assessment & Plan   1.  Acute on chronic diastolic heart failure/atypical chest pain-presented to the emergency department on 12/30/2019 with atypical chest pain and pain through her shoulder blades. Chest x-ray showed  fluid volume overload. Her furosemide was switched to torsemide.She did not begin her torsemide due to anxiety of the medication. Her weightis 340 pounds from 344 pounds 01/04/20.   Continuetorsemide 60 mg twice daily Continue potassium chloride 20 mEq twice daily Continue carvedilol 25 mg twice daily Continue amlodipine 10 mg daily Heart healthy low-sodium diet-salty 6 given Increase physical activity as tolerated Daily weights Continue fluid restriction 64 oz daily or less. Lower extremity support stockings-again recommended Elevate extremities when not active Order BMP  Chronic chest pain/anomalous origin of RCA-cardiac catheterization 11/19 showed normal coronary arteries Continue medical management  Persistent atrial fibrillation-heart rate [id number] Continue carvedilol 25 mg twice daily Continue Xarelto 20 mg daily  Essential hypertension-BP today132/88 Continue carvedilol 25 mg twice daily Heart healthy low-sodium diet-salty 6 given Increase physical activity as tolerated  Morbid obesity-weight today 340 pounds down from344 pounds4/8/21  Heart healthy low-sodium diet Increase physical activity as tolerated Continue weight loss  Disposition: Follow-up with  Dr. [name] in 3 months.  Jossie Ng. Dyron Kawano NP-C    02/27/2020, 2:38 PM [facility] [address] Office [phone] Fax [phone]

## 2020-02-27 ENCOUNTER — Encounter: Payer: Self-pay | Admitting: General Practice

## 2020-02-27 ENCOUNTER — Ambulatory Visit (INDEPENDENT_AMBULATORY_CARE_PROVIDER_SITE_OTHER): Payer: Medicaid Other | Admitting: General Practice

## 2020-02-27 ENCOUNTER — Other Ambulatory Visit: Payer: Self-pay

## 2020-02-27 VITALS — BP 132/88 | HR 97 | Temp 98.1°F | Ht 67.0 in | Wt 340.0 lb

## 2020-02-27 DIAGNOSIS — R079 Chest pain, unspecified: Secondary | ICD-10-CM | POA: Diagnosis not present

## 2020-02-27 DIAGNOSIS — I10 Essential (primary) hypertension: Secondary | ICD-10-CM | POA: Diagnosis not present

## 2020-02-27 DIAGNOSIS — I5033 Acute on chronic diastolic (congestive) heart failure: Secondary | ICD-10-CM | POA: Diagnosis not present

## 2020-02-27 DIAGNOSIS — I5032 Chronic diastolic (congestive) heart failure: Secondary | ICD-10-CM | POA: Diagnosis not present

## 2020-02-27 DIAGNOSIS — Z79899 Other long term (current) drug therapy: Secondary | ICD-10-CM

## 2020-02-27 NOTE — Patient Instructions (Signed)
Medication Instructions:  The current medical regimen is effective;  continue present plan and medications as directed. Please refer to the Current Medication list given to you today., *If you need a refill on your cardiac medications before your next appointment, please call your pharmacy*  Lab Work: BMET TODAY If you have labs (blood work) drawn today and your tests are completely normal, you will receive your results only by:  [place] (if you have MyChart) OR A paper copy in the mail.  If you have any lab test that is abnormal or we need to change your treatment, we will call you to review the results. You may go to any [facility] that is convenient for you however, we do have a lab in our office that is able to assist you. You DO NOT need an appointment for our lab. The lab is open 8:00am and closes at 4:00pm. Lunch 12:45 - 1:45pm.  Special Instructions PLEASE INCREASE PHYSICAL ACTIVITY AS TOLERATED  YOUR FLUID RESTRICTION IS 32 - 48 OUNCE FLUID RESTRICTION DAILY-THIS IS ALL YOUR FLUIDS DAILY, COFFEE, TEA, ETC...  PLEASE PURCHASE AND WEAR COMPRESSION STOCKINGS DAILY AND OFF AT BEDTIME. Compression stockings are elastic socks that squeeze the legs. They help to increase blood flow to the legs and to decrease swelling in the legs from fluid retention, and reduce the chance of developing blood clots in the lower legs. Please put on in the AM when dressing and off at night when dressing for bed. PLEASE MAKE SURE TO ELEVATE YOU LEGS WHILE SITTING, THIS WILL HELP WITH THE SWELLING ALSO.   PLEASE READ AND FOLLOW SALTY 6-ATTACHED  Follow-Up: Your next appointment:  3 month(s) Please call our office 2 months in advance to schedule this appointment In Person with [name], [place], you and your health needs are our priority.  As part of our continuing mission to provide you with exceptional heart care, we have created designated Provider Care Teams.  These Care Teams  include your primary Cardiologist (physician) and Advanced Practice Providers (APPs -  Physician Assistants and Nurse Practitioners) who all work together to provide you with the care you need, when you need it.  We recommend signing up for the patient portal called "MyChart".  Sign up information is provided on this After Visit Summary.  MyChart is used to connect with patients for Virtual Visits (Telemedicine).  Patients are able to view lab/test results, encounter notes, upcoming appointments, etc.  Non-urgent messages can be sent to your provider as well.   To learn more about what you can do with MyChart, go to [email or web address].

## 2020-02-28 LAB — BASIC METABOLIC PANEL
BUN/Creatinine Ratio: 14 (ref 9–23)
BUN: 13 mg/dL (ref 6–24)
CO2: 26 mmol/L (ref 20–29)
Calcium: 9.6 mg/dL (ref 8.7–10.2)
Chloride: 96 mmol/L (ref 96–106)
Creatinine, Ser: 0.92 mg/dL (ref 0.57–1.00)
GFR calc Af Amer: 83 mL/min/{1.73_m2} (ref >59)
GFR calc non Af Amer: 72 mL/min/{1.73_m2} (ref >59)
Glucose: 270 mg/dL — ABNORMAL HIGH (ref 65–99)
Potassium: 4.3 mmol/L (ref 3.5–5.2)
Sodium: 137 mmol/L (ref 134–144)

## 2020-02-28 MED ORDER — MISC. DEVICES MISC: 0 refills | Status: DC

## 2020-02-28 MED FILL — metFORMIN HCL 1000 MG TABS: 1000 | 90 days supply | Qty: 180 | Fill #2

## 2020-02-28 MED FILL — PROAIR HFA 90 MCG INHALER: 108 (90 BAS | 25 days supply | Qty: 9 | Fill #1

## 2020-02-28 NOTE — Addendum Note (Signed)
Addended byMariane Baumgarten on: 02/28/2020 03:50 PM   Modules accepted: Orders

## 2020-02-28 NOTE — Telephone Encounter (Signed)
Pt. Was given the [facility] number to contact regarding the BIPAP machine.

## 2020-03-07 ENCOUNTER — Telehealth: Payer: Self-pay | Admitting: Cardiology

## 2020-03-07 ENCOUNTER — Other Ambulatory Visit (INDEPENDENT_AMBULATORY_CARE_PROVIDER_SITE_OTHER): Payer: Medicaid Other

## 2020-03-07 DIAGNOSIS — I10 Essential (primary) hypertension: Secondary | ICD-10-CM | POA: Diagnosis not present

## 2020-03-07 NOTE — Telephone Encounter (Signed)
°  Lm to call back with reommendations ./cy  Spoke with and continues to c/o SOB  no change in symptoms from when was seen on 02/27/20 Per pt weight is now 333 which maybe down from when was seen in office which  was 340 .Discussed with Coletta Memos NP if symptoms unchanged then needs to go to ED for eval and tx ./cy

## 2020-03-07 NOTE — Telephone Encounter (Signed)
Pt c/o of Chest Pain: STAT if CP now or developed within 24 hours  1. Are you having CP right now? no  2. Are you experiencing any other symptoms (ex. SOB, nausea, vomiting, sweating)? All of it   3. How long have you been experiencing CP? A few weeks   4. Is your CP continuous or coming and going? All the time   5. Have you taken Nitroglycerin? No  Pt c/o Shortness Of Breath: STAT if SOB developed within the last 24 hours or pt is noticeably SOB on the phone  1. Are you currently SOB (can you hear that pt is SOB on the phone)? yes  2. How long have you been experiencing SOB? always  3. Are you SOB when sitting or when up moving around? With movement  4. Are you currently experiencing any other symptoms? no  ?

## 2020-03-08 NOTE — Telephone Encounter (Signed)
Left message for patient to go to the ER if symptoms from yesterday have not changed.

## 2020-03-11 MED FILL — POTASSIUM CL ER 20 MEQ TABL: 20 | 90 days supply | Qty: 180 | Fill #2

## 2020-03-11 MED FILL — AMLODIPINE BESYLATE 10 MG T: 10 | 90 days supply | Qty: 90 | Fill #2

## 2020-03-11 MED FILL — TORSEMIDE 20 MG TABS: 20 | 30 days supply | Qty: 240 | Fill #1

## 2020-03-11 MED FILL — LOSARTAN POTASSIUM 100 MG T: 100 | 90 days supply | Qty: 90 | Fill #2

## 2020-03-12 ENCOUNTER — Telehealth: Payer: Self-pay

## 2020-03-12 NOTE — Telephone Encounter (Signed)
Pt request return call with information pertaining to resources available for a BIPAP machine. Pt daughter says they contacted [facility] they required payment. Please advise.

## 2020-03-13 ENCOUNTER — Other Ambulatory Visit: Payer: Self-pay | Admitting: *Deleted

## 2020-03-14 ENCOUNTER — Encounter: Payer: Self-pay | Admitting: Dietician

## 2020-03-14 ENCOUNTER — Encounter: Payer: Medicaid Other | Attending: Internal Medicine | Admitting: Dietician

## 2020-03-14 ENCOUNTER — Other Ambulatory Visit: Payer: Self-pay

## 2020-03-14 DIAGNOSIS — E1165 Type 2 diabetes mellitus with hyperglycemia: Secondary | ICD-10-CM | POA: Diagnosis not present

## 2020-03-14 NOTE — Progress Notes (Signed)
Medical Nutrition Therapy  Follow-Up Visit  Patient was originally seen on 01/22/2020 for diabetes and weight management then 02/19/2020 for follow up.  Primary concerns today: weight management, considering bariatric surgery    NUTRITION ASSESSMENT   Anthropometrics  Weight: 329.5 lbs  (decreased from 333.4 lbs on 02/19/2020) BMI: 51.6  Clinical Medical Hx: T2DM, CHF, bipolar, HTN, OSA Labs: A1c 7.6% (01/22/2020)  Lifestyle & Dietary Hx Patient states she is still considering bariatric surgery and would like to lose weight. States she does not like vegetables and does not care to eat them. States her blood sugars are 160-180 after eating. Typical meal pattern is 3 meals per day plus a snack.   24-Hr Dietary Recall First Meal: 2 eggs   Snack: - Second Meal: tuna sandwich  Snack: watermelon  Third Meal: pizza Snack: - Beverages: 4 water bottles/day   Estimated Energy Needs Calories: 1600 Carbohydrate: 180g Protein: 100g Fat: 53g   NUTRITION DIAGNOSIS  01/22/20: Food and nutrition related knowledge deficit (NB-1.1) related to imbalance of carbohydrates/protein/fat as evidenced by dietary hx and patient report Status: Continue   NUTRITION INTERVENTION  Nutrition education (E-1) including balanced eating for diabetes and weight management. Today we focused on balanced snacks to include a source of carbohydrates and protein.   Handouts Provided Include   Balanced Snacks  Learning Style & Readiness for Change Teaching method utilized: Visual & Auditory  Demonstrated degree of understanding via: Teach Back  Barriers to learning/adherence to lifestyle change: Contemplative Stage of Change, Overeating, Arabic as Primary Language   Progress/ Updates on Pt's Goals . Continue avoiding added salt . Continue working on including a salad with dinner . Continue practicing eating mindfully (eating slowly, chewing food well, stopping when satisfied rather than when full)  . Follow  the MyPlate and Meal Ideas sheets to build balanced lunch and dinner meals . Consider joining the T2DM and/or Bariatric Support Groups  . NEW: Make snacks balanced by including a source of protein   MONITORING & EVALUATION Dietary intake, weekly physical activity, and goals in 4 weeks.  Next Steps  Patient is to return to [facility] for follow up visit in 4 weeks.

## 2020-03-18 ENCOUNTER — Encounter: Payer: Self-pay | Admitting: Nurse Practitioner

## 2020-03-18 ENCOUNTER — Other Ambulatory Visit: Payer: Self-pay | Admitting: Nurse Practitioner

## 2020-03-18 ENCOUNTER — Other Ambulatory Visit: Payer: Self-pay

## 2020-03-18 ENCOUNTER — Ambulatory Visit: Payer: Medicaid Other | Attending: Nurse Practitioner | Admitting: Nurse Practitioner

## 2020-03-18 ENCOUNTER — Ambulatory Visit: Payer: Medicaid Other

## 2020-03-18 VITALS — BP 120/74 | HR 100 | Temp 97.7°F | Ht 67.0 in | Wt 335.0 lb

## 2020-03-18 DIAGNOSIS — Z7901 Long term (current) use of anticoagulants: Secondary | ICD-10-CM | POA: Insufficient documentation

## 2020-03-18 DIAGNOSIS — G4733 Obstructive sleep apnea (adult) (pediatric): Secondary | ICD-10-CM | POA: Insufficient documentation

## 2020-03-18 DIAGNOSIS — Z8249 Family history of ischemic heart disease and other diseases of the circulatory system: Secondary | ICD-10-CM | POA: Diagnosis not present

## 2020-03-18 DIAGNOSIS — I4819 Other persistent atrial fibrillation: Secondary | ICD-10-CM | POA: Diagnosis not present

## 2020-03-18 DIAGNOSIS — E559 Vitamin D deficiency, unspecified: Secondary | ICD-10-CM

## 2020-03-18 DIAGNOSIS — F3162 Bipolar disorder, current episode mixed, moderate: Secondary | ICD-10-CM | POA: Diagnosis not present

## 2020-03-18 DIAGNOSIS — Z79899 Other long term (current) drug therapy: Secondary | ICD-10-CM | POA: Diagnosis not present

## 2020-03-18 DIAGNOSIS — R5383 Other fatigue: Secondary | ICD-10-CM | POA: Diagnosis present

## 2020-03-18 DIAGNOSIS — I5032 Chronic diastolic (congestive) heart failure: Secondary | ICD-10-CM | POA: Insufficient documentation

## 2020-03-18 DIAGNOSIS — Z794 Long term (current) use of insulin: Secondary | ICD-10-CM | POA: Insufficient documentation

## 2020-03-18 DIAGNOSIS — Z888 Allergy status to other drugs, medicaments and biological substances status: Secondary | ICD-10-CM | POA: Insufficient documentation

## 2020-03-18 DIAGNOSIS — Z6841 Body Mass Index (BMI) 40.0 and over, adult: Secondary | ICD-10-CM | POA: Insufficient documentation

## 2020-03-18 DIAGNOSIS — E1165 Type 2 diabetes mellitus with hyperglycemia: Secondary | ICD-10-CM | POA: Diagnosis not present

## 2020-03-18 DIAGNOSIS — Z1231 Encounter for screening mammogram for malignant neoplasm of breast: Secondary | ICD-10-CM

## 2020-03-18 DIAGNOSIS — Z1159 Encounter for screening for other viral diseases: Secondary | ICD-10-CM

## 2020-03-18 DIAGNOSIS — I110 Hypertensive heart disease with heart failure: Secondary | ICD-10-CM | POA: Diagnosis not present

## 2020-03-18 DIAGNOSIS — I252 Old myocardial infarction: Secondary | ICD-10-CM | POA: Insufficient documentation

## 2020-03-18 LAB — POCT GLYCOSYLATED HEMOGLOBIN (HGB A1C): Hemoglobin A1C: 8.7 % — AB (ref 4.0–5.6)

## 2020-03-18 LAB — GLUCOSE, POCT (MANUAL RESULT ENTRY): POC Glucose: 224 mg/dl — AB (ref 70–99)

## 2020-03-18 MED ORDER — TRULICITY 0.75 MG/0.5ML ~~LOC~~ SOAJ: 0.7500 mg | SUBCUTANEOUS | 1 refills | Status: AC

## 2020-03-18 MED ORDER — ATORVASTATIN CALCIUM 40 MG PO TABS: 40.0000 mg | ORAL_TABLET | Freq: Every day | ORAL | 2 refills | Status: DC

## 2020-03-18 MED ORDER — ACCU-CHEK SOFTCLIX LANCETS MISC: 12 refills | Status: DC

## 2020-03-18 MED ORDER — METFORMIN HCL 1000 MG PO TABS: 1000.0000 mg | ORAL_TABLET | Freq: Two times a day (BID) | ORAL | 2 refills | Status: DC

## 2020-03-18 MED ORDER — RIVAROXABAN 20 MG PO TABS: ORAL_TABLET | ORAL | 1 refills | Status: DC

## 2020-03-18 MED ORDER — RISPERIDONE 4 MG PO TABS: 4.0000 mg | ORAL_TABLET | Freq: Every day | ORAL | 1 refills | Status: DC

## 2020-03-18 MED ORDER — AMLODIPINE BESYLATE 10 MG PO TABS: 10.0000 mg | ORAL_TABLET | Freq: Every day | ORAL | 1 refills | Status: DC

## 2020-03-18 MED ORDER — ACCU-CHEK AVIVA PLUS VI STRP: ORAL_STRIP | 12 refills | Status: DC

## 2020-03-18 MED ORDER — GLIPIZIDE ER 10 MG PO TB24: 10.0000 mg | ORAL_TABLET | Freq: Every evening | ORAL | 1 refills | Status: DC

## 2020-03-18 MED ORDER — BD PEN NEEDLE MINI U/F 31G X 5 MM MISC: 3 refills | Status: DC

## 2020-03-18 MED ORDER — CARVEDILOL 25 MG PO TABS: 25.0000 mg | ORAL_TABLET | Freq: Two times a day (BID) | ORAL | 1 refills | Status: DC

## 2020-03-18 MED FILL — ACCU-CHEK SOFTCLIX LANCETS: 90 days supply | Qty: 100 | Fill #0

## 2020-03-18 MED FILL — XARELTO 20 MG TABLET: 20 | 90 days supply | Qty: 90 | Fill #0

## 2020-03-18 MED FILL — TRUEPLUS 5-BEVEL PEN NEEDLE: 31G X 5 MM | 90 days supply | Qty: 100 | Fill #0

## 2020-03-18 MED FILL — ATORVASTATIN CALCIUM 40 MG: 40 | 90 days supply | Qty: 90 | Fill #0

## 2020-03-18 MED FILL — ACCU-CHEK AVIVA PLUS STRP: 90 days supply | Qty: 100 | Fill #0

## 2020-03-18 MED FILL — risperiDONE 4 MG TABS: 4 | 90 days supply | Qty: 90 | Fill #0

## 2020-03-18 MED FILL — TRULICITY 0.75 MG/0.5 ML PE: 0.75 | 84 days supply | Qty: 6 | Fill #0

## 2020-03-18 NOTE — Telephone Encounter (Signed)
Met with the patient and her mother today.  They explained that they called [employer] to check on the order for the BiPAP and were informed that they needed to pick up the machine in [facility] and provide a credit card in the event that medicaid does not pay for the machine.  They do not have a credit card. Explained to them that this CM would check with [employer] to confirm if a credit card is necessary.  If it is, they are agreeable to having the order sent to another DME provider.  Message sent to [facility]/[employer] to inquire about the need for a credit card in order to secure the BiPAP machine.

## 2020-03-18 NOTE — Progress Notes (Signed)
Assessment & Plan:  [name] was seen today for fatigue.  Diagnoses and all orders for this visit:  Uncontrolled type 2 diabetes mellitus with hyperglycemia (HCC) -     Glucose (CBG) -     HgB A1c -     Accu-Chek Softclix Lancets lancets; USE AS DIRECTED TO TEST BLOOD SUGAR ONCE DAILY -     atorvastatin (LIPITOR) 40 MG tablet; Take 1 tablet (40 mg total) by mouth daily at 6 PM. -     glipiZIDE (GLUCOTROL XL) 10 MG 24 hr tablet; Take 1 tablet (10 mg total) by mouth every evening. -     glucose blood (ACCU-CHEK AVIVA PLUS) test strip; USE AS DIRECTED TO TEST BLOOD SUGAR ONCE DAILY -     metFORMIN (GLUCOPHAGE) 1000 MG tablet; Take 1 tablet (1,000 mg total) by mouth 2 (two) times daily with a meal. -     Dulaglutide (TRULICITY) 7.32 KG/2.5KY SOPN; Inject 0.5 mLs (0.75 mg total) into the skin once a week. -     Insulin Pen Needle (B-D UF III MINI PEN NEEDLES) 31G X 5 MM MISC; Use as instructed. Inject into the skin once weekly. E11.65 Continue blood sugar control as discussed in office today, low carbohydrate diet, and regular physical exercise as tolerated, 150 minutes per week (30 min each day, 5 days per week, or 50 min 3 days per week). Keep blood sugar logs with fasting goal of 90-130 mg/dl, post prandial (after you eat) less than 180.  For Hypoglycemia: BS <60 and Hyperglycemia BS >400; contact the clinic ASAP. Annual eye exams and foot exams are recommended.  Need for hepatitis C screening test -     Hepatitis C Antibody  Vitamin D deficiency disease -     VITAMIN D 25 Hydroxy (Vit-D Deficiency, Fractures)  Persistent atrial fibrillation (HCC) -     rivaroxaban (XARELTO) 20 MG TABS tablet; TAKE 1 TABLET (20 MG TOTAL) BY MOUTH DAILY WITH SUPPER.  Bipolar 1 disorder, mixed, moderate (HCC) -     risperidone (RISPERDAL) 4 MG tablet; Take 1 tablet (4 mg total) by mouth daily.  Breast cancer screening by mammogram -     MM 3D SCREEN BREAST BILATERAL; Future  Hypertensive heart disease  with chronic diastolic congestive heart failure (HCC) -     amLODipine (NORVASC) 10 MG tablet; Take 1 tablet (10 mg total) by mouth daily. -     carvedilol (COREG) 25 MG tablet; Take 1 tablet (25 mg total) by mouth 2 (two) times daily with a meal. Continue all antihypertensives as prescribed.  Remember to bring in your blood pressure log with you for your follow up appointment.  DASH/Mediterranean Diets are healthier choices for HTN.  CONTINUE F/U with CARDIOLOGY as instructed   Patient has been counseled on age-appropriate routine health concerns for screening and prevention. These are reviewed and up-to-date. Referrals have been placed accordingly. Immunizations are up-to-date or declined.    Subjective:   Chief Complaint  Patient presents with   Fatigue    Pt. stated she is tired, pain all over her body, and she is having chest pain from walking.    HPI [name] [age] y.o. female presents to office today for follow up. She has an extensive medical history. Currently being followed by Cardiology for chronic diastolic CHF. She has OSA, morbid obesity, essential hypertension, type 2 diabetes, bipolar disorder, SVT, moderate to severe pulmonary hypertension, history of non-STEMI, cor pulmonale and A. Fib.  Furosemide switched to  torsemide after she was seen in the emergency room on 12/30/2019 with fluid volume overload.  There was a delay with her initiating the torsemide due to her reluctance with switching from furosemide.  She is nonadherent with wearing compression stockings. She is still awaiting her BIPAP. I did have the case manager speak with her today regarding the delay which appears to be with the actual DME company requesting patient pay for her BIPAP out of pocket while the company awaits payment from [facility]. Patient is not able to afford the OOP cost   Per cardiology note on Mar 17, 2020 her brother passed away in 03-17-2023.  Daily fluid intake is still over the recommended  amount.  She does not take her cholesterol medication. States " I don't need it".  The ASCVD Risk score Mikey Bussing DC Jr., et al., 2013) failed to calculate for the following reasons:   The patient has a prior MI or stroke diagnosis  We did have a discussion regarding the need for a statin with history of NSTEMI and DM.    DM TYPE 2 Not well controlled.  She recently saw a dietitian which she states was not very helpful.  She is accompanied by her mother today who states she feels a diabetes support group would be more beneficial for [name]. Chrisie is unsure if she wants to go to a support group at this time. She is not exercising. I have recommended she walk early in the am to avoid the heat and at least 20-30 min.  We will add Trulicity today.  She is to continue on glipizide 10 mg daily and Metformin 1000 mg twice daily.  She is up-to-date on her eye exam.  Not adherent with statin. Lab Results  Component Value Date   HGBA1C 8.7 (A) 03/18/2020   Lab Results  Component Value Date   HGBA1C 7.6 (H) 11/07/2019   CHF She is not adherent with wearing her compression stockings daily.  States they are uncomfortable.  Also does not like taking torsemide due to increased urinary frequency.  Would like to go back to furosemide which I have instructed her would not be advantageous in regard to her congestive heart failure and increased risk of volume overload.    Essential Hypertension Well-controlled today.  She endorses medication adherence taking amlodipine 10 mg daily and carvedilol 25 mg twice daily. Denies headaches or visual disturbances.  She does have a history of chronic chest pain with exertion as well as shortness of breath. BP Readings from Last 3 Encounters:  03/18/20 120/74  17-Mar-2020 132/88  01/26/20 130/70   Review of Systems  Constitutional: Positive for malaise/fatigue. Negative for fever and weight loss.  HENT: Negative.  Negative for nosebleeds.   Eyes: Negative.  Negative for  blurred vision, double vision and photophobia.  Respiratory: Positive for shortness of breath. Negative for cough.   Cardiovascular: Positive for leg swelling. Negative for chest pain and palpitations.  Gastrointestinal: Negative.  Negative for heartburn, nausea and vomiting.  Genitourinary: Positive for frequency.  Musculoskeletal: Negative.  Negative for myalgias.  Neurological: Negative.  Negative for dizziness, focal weakness, seizures and headaches.  Psychiatric/Behavioral: Negative.  Negative for suicidal ideas.    Past Medical History:  Diagnosis Date   Acute on chronic diastolic congestive heart failure ([place]) 11/02/2013   10/03/2015, 11/13/2015, 08/03/2017   Benign essential HTN 11/28/2013   Bipolar disease, chronic ([place])    Chest pain    a. 2012 Myoview: EF 63%, no isch/infarct;  b. 04/2016 Carlton Adam  MV: EF 73%, no ischemia/infarct-->Low risk.   Chronic diastolic CHF (congestive heart failure) ([place]) 07/23/2011   a. 2015 Echo: EF 55-60%, Gr2 DD;  b. 09/2015 Echo: EF 60-65%, no rwma, mod dil LA, PASP 44mHg.   Cor pulmonale (chronic) (HCC)    History of thyrotoxicosis    HTN (hypertension) 11/28/2013   Hypertensive heart disease 10/18/2013   Hypoglycemia    Insulin dependent type 2 diabetes mellitus, uncontrolled ([place]    Mediastinal adenopathy    Morbid obesity due to excess calories ([place] 02/19/2011   Morbid obesity with BMI of 50.0-59.9, adult (HCC)    OSA (obstructive sleep apnea) 03/06/2011   Persistent atrial fibrillation ([place] 12/09/2017   Pulmonary HTN, moderate to severe 11/03/2013   Sinusitis, chronic 01/02/2015   SVT (supraventricular tachycardia) ([place] 12/06/2013   Uncontrolled type 2 diabetes mellitus with hyperglycemia ([facility]     Past Surgical History:  Procedure Laterality Date   CARDIOVERSION N/A 04/05/2018   Procedure: CARDIOVERSION;  Surgeon: CLelon Perla MD;  [place]  Service: Cardiovascular;  Laterality: N/A;   COLONOSCOPY  WITH PROPOFOL Left 07/16/2018   Procedure: COLONOSCOPY WITH PROPOFOL;  Surgeon: KRonnette Juniper MD;  [facility];  Service: Gastroenterology;  Laterality: Left;   LEFT HEART CATH AND CORONARY ANGIOGRAPHY N/A 08/04/2018   Procedure: LEFT HEART CATH AND CORONARY ANGIOGRAPHY;  Surgeon: SBelva Crome MD;  [place] [facility];  Service: Cardiovascular;  Laterality: N/A;   None     POLYPECTOMY  07/16/2018   Procedure: POLYPECTOMY;  Surgeon: KRonnette Juniper MD;  [facility];  Service: Gastroenterology;;    Family History  Problem Relation Age of Onset   Heart failure Father    Stroke Father    Hypertension Mother    Heart disease Maternal Grandfather     Social History Reviewed with no changes to be made today.   Outpatient Medications Prior to Visit  Medication Sig Dispense Refill   albuterol (PROAIR HFA) 108 (90 Base) MCG/ACT inhaler INHALE 1 TO 2 PUFFS EVERY 6 HOURS AS NEEDED FOR WHEEZING/ SHORTNESS OF BREATH 8.5 g 2   Blood Glucose Monitoring Suppl (ACCU-CHEK AVIVA PLUS) w/Device KIT 1 each by Does not apply route daily. USE AS DIRECTED TO TEST BLOOD SUGAR ONCE DAILY 1 kit 0   Blood Glucose Monitoring Suppl (TRUE METRIX METER) w/Device KIT Use as instructed. Check blood glucose levels by fingerstick twice per day 1 kit 0   ferrous sulfate (FEROSUL) 325 (65 FE) MG tablet Take 1 tablet (325 mg total) by mouth 2 (two) times daily. 60 tablet 3   Misc. Devices MISC Please provide BiPAP machine with the following: Settings 22/18 cm H2O. Medium  size [employer] Full Face Mask Simplus mask and heated  humidification. 1 each 0   oxybutynin (DITROPAN) 5 MG tablet Take 5 mg by mouth 3 (three) times daily.     Potassium Chloride ER 20 MEQ TBCR Take 20 mEq by mouth 2 (two) times daily. 60 tablet 11   torsemide (DEMADEX) 20 MG tablet TAKE 4 TABLETS (80 MG TOTAL) BY MOUTH 2 (TWO) TIMES DAILY. 240 tablet 11   Accu-Chek Softclix Lancets lancets USE AS  DIRECTED TO TEST BLOOD SUGAR ONCE DAILY 100 each 12   amLODipine (NORVASC) 10 MG tablet Take 10 mg by mouth daily.     carvedilol (COREG) 25 MG tablet Take 25 mg by mouth 2 (two) times daily with a meal.     glipiZIDE (GLUCOTROL XL) 10 MG  24 hr tablet Take 10 mg by mouth every evening.     glucose blood (ACCU-CHEK AVIVA PLUS) test strip USE AS DIRECTED TO TEST BLOOD SUGAR ONCE DAILY 100 each 12   metFORMIN (GLUCOPHAGE) 1000 MG tablet Take 1 tablet (1,000 mg total) by mouth 2 (two) times daily with a meal. 180 tablet 2   risperidone (RISPERDAL) 4 MG tablet Take 1 tablet (4 mg total) by mouth daily. 90 tablet 1   rivaroxaban (XARELTO) 20 MG TABS tablet TAKE 1 TABLET (20 MG TOTAL) BY MOUTH DAILY WITH SUPPER. 90 tablet 1   escitalopram (LEXAPRO) 10 MG tablet 1/2 ab PO daily x 2 wks then 1 tab PO daily (Patient not taking: Reported on 03/18/2020) 30 tablet 1   atorvastatin (LIPITOR) 40 MG tablet Take 1 tablet (40 mg total) by mouth daily at 6 PM. (Patient not taking: Reported on 03/18/2020) 90 tablet 0   No facility-administered medications prior to visit.    Allergies  Allergen Reactions   Acetaminophen Other (See Comments)    Seizure-like "fits" as a child   Caffeine     Tense, anxiety, increased urination   [place] [Dapagliflozin] Other (See Comments)    Hallucinations, drop in blood sugar   Lisinopril Rash    Rash with lisinopril; but fosinopril is ok per patient       Objective:    BP 120/74 (BP Location: Left Arm, Patient Position: Sitting, Cuff Size: Large)    Pulse 100    Temp 97.7 F (36.5 C) (Temporal)    Ht 5' 7" (1.702 m)    Wt (!) 335 lb (152 kg)    SpO2 97%    BMI 52.47 kg/m  Wt Readings from Last 3 Encounters:  03/18/20 (!) 335 lb (152 kg)  03/14/20 (!) 329 lb 8 oz (149.5 kg)  02/27/20 (!) 340 lb (154.2 kg)    Physical Exam Vitals and nursing note reviewed.  Constitutional:      Appearance: She is well-developed.  HENT:     Head: Normocephalic and  atraumatic.  Cardiovascular:     Rate and Rhythm: Rhythm irregularly irregular.     Heart sounds: Normal heart sounds. No murmur heard.  No friction rub. No gallop.   Pulmonary:     Effort: Pulmonary effort is normal. No tachypnea or respiratory distress.     Breath sounds: Normal breath sounds. No decreased breath sounds, wheezing, rhonchi or rales.  Chest:     Chest wall: No tenderness.  Abdominal:     General: Bowel sounds are normal.     Palpations: Abdomen is soft.  Musculoskeletal:        General: Normal range of motion.     Cervical back: Normal range of motion.     Right lower leg: Edema present.     Left lower leg: Edema present.     Comments: Mild BLE non pitting edema  Skin:    General: Skin is warm and dry.  Neurological:     Mental Status: She is alert and oriented to person, place, and time.     Coordination: Coordination normal.  Psychiatric:        Behavior: Behavior normal. Behavior is cooperative.        Thought Content: Thought content normal.        Judgment: Judgment normal.          Patient has been counseled extensively about nutrition and exercise as well as the importance of adherence with medications and regular follow-up.  The patient was given clear instructions to go to ER or return to medical center if symptoms don't improve, worsen or new problems develop. The patient verbalized understanding.   Follow-up: Return in about 4 weeks (around 04/15/2020) for luke meter check. See me in 3 months.   [name], FNP-BC [facility] [place], [place]   03/18/2020, 3:08 PM

## 2020-03-18 NOTE — Progress Notes (Signed)
Oct c

## 2020-03-19 ENCOUNTER — Other Ambulatory Visit: Payer: Self-pay | Admitting: Nurse Practitioner

## 2020-03-19 LAB — VITAMIN D 25 HYDROXY (VIT D DEFICIENCY, FRACTURES): Vit D, 25-Hydroxy: 11.9 ng/mL — ABNORMAL LOW (ref 30.0–100.0)

## 2020-03-19 LAB — HEPATITIS C ANTIBODY: Hep C Virus Ab: <0.1 s/co ratio (ref 0.0–0.9)

## 2020-03-19 MED ORDER — VITAMIN D (ERGOCALCIFEROL) 1.25 MG (50000 UNIT) PO CAPS
50000.0000 [IU] | ORAL_CAPSULE | ORAL | 1 refills | Status: DC
Start: 2020-03-19 — End: 2021-04-18

## 2020-03-19 MED FILL — VIT D2 1.25 MG (50,000 UNIT: 1.25 MG | 84 days supply | Qty: 12 | Fill #0

## 2020-03-25 ENCOUNTER — Telehealth: Payer: Self-pay

## 2020-03-25 NOTE — Telephone Encounter (Signed)
Call placed to patient/ her mother [phone]  to check on status of CPAP and inquire if they have received a call from [employer].  Message left with call back requested to this CM.   As per [facility], a credit card is not needed for patients with medicaid in order to obtain the CPAP.

## 2020-04-02 ENCOUNTER — Telehealth: Payer: Self-pay

## 2020-04-02 NOTE — Telephone Encounter (Signed)
Call placed to patient to inquire if she has received her CPAP yest as she does not needs a credit card to obtain it.  She said that she does not have it and has not been contacted by [place].  Message sent to [facility] [employer] to check on status of CPAP order

## 2020-04-05 ENCOUNTER — Telehealth: Payer: Self-pay

## 2020-04-05 MED FILL — VIT D2 1.25 MG (50,000 UNIT: 1.25 MG | 84 days supply | Qty: 12 | Fill #0

## 2020-04-05 NOTE — Telephone Encounter (Addendum)
The patient's mother came to the office to check the stautus of the CPAP order as she has not heard form [place] message and community message sent to [facility] [address] requesting a call back to this CM regarding the status of the CPAP order.  Call placed to [place] # [phone] spoke to [place] who transferred the call to CPAP dept.  That call was disconnected after15 minute wait. Call placed to [employer] again, spoke to [place] who transferred call to CPAP dept and this CM was on hold for 20 minutes.    Informed patient's mother that this CM has not been able to reach anyone at [facility] for an update on the CPAP order. Explained that this CM will try again next week and provided her with the phone number for [employer] and encouraged her to call also

## 2020-04-08 ENCOUNTER — Telehealth: Payer: Self-pay

## 2020-04-08 NOTE — Telephone Encounter (Signed)
Call received from [facility], [place].  Explained to her that the patient has still not received her CPAP.  She said that they have tried to reach her without success.  Phone numbers were verified and explained to her that it would be best to try this #  [phone] first. She said that she would have the staff reach out to her again

## 2020-04-11 ENCOUNTER — Telehealth: Payer: Self-pay

## 2020-04-11 NOTE — Telephone Encounter (Signed)
Call placed to patient's mother to inquire if they have received the CPAP machine and she said that she has not heard from [facility].   Provided her with the phone number for [employer] and strongly encouraged her to call to inquire about picking the machine up.  She said she would call [employer].

## 2020-04-23 ENCOUNTER — Encounter: Payer: Medicaid Other | Attending: Internal Medicine | Admitting: Dietician

## 2020-04-23 DIAGNOSIS — E1165 Type 2 diabetes mellitus with hyperglycemia: Secondary | ICD-10-CM | POA: Insufficient documentation

## 2020-05-03 MED FILL — risperiDONE 4 MG TABS: 4 | 90 days supply | Qty: 90 | Fill #0

## 2020-05-03 MED FILL — glipiZIDE XL 10 MG TB24: 10 | 90 days supply | Qty: 90 | Fill #0

## 2020-05-03 MED FILL — PROAIR HFA 90 MCG INHALER: 108 (90 BAS | 25 days supply | Qty: 9 | Fill #2

## 2020-05-03 MED FILL — OXYBUTYNIN CL ER 15 MG TAB: 15 | 90 days supply | Qty: 90 | Fill #2

## 2020-05-03 MED FILL — CARVEDILOL 25 MG TABLET: 25 | 90 days supply | Qty: 180 | Fill #0

## 2020-05-14 MED FILL — TORSEMIDE 20 MG TABLET: 20 | 30 days supply | Qty: 240 | Fill #2

## 2020-05-16 NOTE — Progress Notes (Signed)
HPI: Follow-up chest pain,chronicdiastolic congestive heart failure,atrial fibrillation,obesity and hypertension. Also with history of SVTand atrial fibrillation. Admitted with recurrent chest pain November 2019. Enzymes positive.Follow-up limited study November 2019 showed normal LV function and biatrial enlargement. CTA showed no pulmonary embolus. 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 Ejection fraction 65%. Left ventricular end-diastolic pressure 26 mmHg. Procedure complicated by ventricular fibrillation requiring defibrillation. This also resulted in reestablishing sinus rhythm as patient had been in atrial fibrillation previously. Developed contrast nephropathy following procedure.  Last echocardiogram March 2021 showed vigorous LV function, moderately elevated pulmonary pressures, mild biatrial enlargement, mild mitral regurgitation.  Since last seen,she has mild dyspnea on exertion much improved compared to the past.  She has occasional chest discomfort but no exertional chest pain.  No syncope or pedal edema.  Current Outpatient Medications  Medication Sig Dispense Refill  . Accu-Chek Softclix Lancets lancets USE AS DIRECTED TO TEST BLOOD SUGAR ONCE DAILY 100 each 12  . albuterol (PROAIR HFA) 108 (90 Base) MCG/ACT inhaler INHALE 1 TO 2 PUFFS EVERY 6 HOURS AS NEEDED FOR WHEEZING/ SHORTNESS OF BREATH 8.5 g 2  . amLODipine (NORVASC) 10 MG tablet Take 1 tablet (10 mg total) by mouth daily. 90 tablet 1  . atorvastatin (LIPITOR) 40 MG tablet Take 1 tablet (40 mg total) by mouth daily at 6 PM. 90 tablet 2  . Blood Glucose Monitoring Suppl (ACCU-CHEK AVIVA PLUS) w/Device KIT 1 each by Does not apply route daily. USE AS DIRECTED TO TEST BLOOD SUGAR ONCE DAILY 1 kit 0  . Blood Glucose Monitoring Suppl (TRUE METRIX METER) w/Device KIT Use as  instructed. Check blood glucose levels by fingerstick twice per day 1 kit 0  . carvedilol (COREG) 25 MG tablet Take 1 tablet (25 mg total) by mouth 2 (two) times daily with a meal. 180 tablet 1  . Dulaglutide (TRULICITY) 5.72 IO/0.3TD SOPN Inject 0.5 mLs (0.75 mg total) into the skin once a week. 6 mL 1  . escitalopram (LEXAPRO) 10 MG tablet 1/2 ab PO daily x 2 wks then 1 tab PO daily 30 tablet 1  . ferrous sulfate (FEROSUL) 325 (65 FE) MG tablet Take 1 tablet (325 mg total) by mouth 2 (two) times daily. 60 tablet 3  . glipiZIDE (GLUCOTROL XL) 10 MG 24 hr tablet Take 1 tablet (10 mg total) by mouth every evening. 90 tablet 1  . glucose blood (ACCU-CHEK AVIVA PLUS) test strip USE AS DIRECTED TO TEST BLOOD SUGAR ONCE DAILY 100 each 12  . Insulin Pen Needle (B-D UF III MINI PEN NEEDLES) 31G X 5 MM MISC Use as instructed. Inject into the skin once weekly. E11.65 100 each 3  . metFORMIN (GLUCOPHAGE) 1000 MG tablet Take 1 tablet (1,000 mg total) by mouth 2 (two) times daily with a meal. 180 tablet 2  . Misc. Devices MISC Please provide BiPAP machine with the following: Settings 22/18 cm H2O. Medium  size [employer] Full Face Mask Simplus mask and heated  humidification. 1 each 0  . oxybutynin (DITROPAN) 5 MG tablet Take 5 mg by mouth 3 (three) times daily.    . Potassium Chloride ER 20 MEQ TBCR Take 20 mEq by mouth 2 (two) times daily. 60 tablet 11  . risperidone (RISPERDAL) 4 MG tablet Take 1 tablet (4 mg total) by mouth daily. 90 tablet 1  . rivaroxaban (XARELTO)  20 MG TABS tablet TAKE 1 TABLET (20 MG TOTAL) BY MOUTH DAILY WITH SUPPER. 90 tablet 1  . torsemide (DEMADEX) 20 MG tablet TAKE 4 TABLETS (80 MG TOTAL) BY MOUTH 2 (TWO) TIMES DAILY. 240 tablet 11  . Vitamin D, Ergocalciferol, (DRISDOL) 1.25 MG (50000 UNIT) CAPS capsule Take 1 capsule (50,000 Units total) by mouth every 7 (seven) days. 12 capsule 1   No current facility-administered medications for this visit.     Past Medical History:   Diagnosis Date  . Acute on chronic diastolic congestive heart failure ([place]) 11/02/2013   10/03/2015, 11/13/2015, 08/03/2017  . Benign essential HTN 11/28/2013  . Bipolar disease, chronic ([place])   . Chest pain    a. 2012 Myoview: EF 63%, no isch/infarct;  b. 04/2016 Lexiscan MV: EF 73%, no ischemia/infarct-->Low risk.  . Chronic diastolic CHF (congestive heart failure) ([place]) 07/23/2011   a. 2015 Echo: EF 55-60%, Gr2 DD;  b. 09/2015 Echo: EF 60-65%, no rwma, mod dil LA, PASP 14mHg.  . Cor pulmonale (chronic) ([place]   . History of thyrotoxicosis   . HTN (hypertension) 11/28/2013  . Hypertensive heart disease 10/18/2013  . Hypoglycemia   . Insulin dependent type 2 diabetes mellitus, uncontrolled ([place]   . Mediastinal adenopathy   . Morbid obesity due to excess calories ([place] 02/19/2011  . Morbid obesity with BMI of 50.0-59.9, adult ([place]   . OSA (obstructive sleep apnea) 03/06/2011  . Persistent atrial fibrillation ([place] 12/09/2017  . Pulmonary HTN, moderate to severe 11/03/2013  . Sinusitis, chronic 01/02/2015  . SVT (supraventricular tachycardia) ([place] 12/06/2013  . Uncontrolled type 2 diabetes mellitus with hyperglycemia ([facility]     Past Surgical History:  Procedure Laterality Date  . CARDIOVERSION N/A 04/05/2018   Procedure: CARDIOVERSION;  Surgeon: CLelon Perla MD;  [facility];  Service: Cardiovascular;  Laterality: N/A;  . COLONOSCOPY WITH PROPOFOL Left 07/16/2018   Procedure: COLONOSCOPY WITH PROPOFOL;  Surgeon: KRonnette Juniper MD;  Location: [facility];  Service: Gastroenterology;  Laterality: Left;  . LEFT HEART CATH AND CORONARY ANGIOGRAPHY N/A 08/04/2018   Procedure: LEFT HEART CATH AND CORONARY ANGIOGRAPHY;  Surgeon: SBelva Crome MD;  [facility];  Service: Cardiovascular;  Laterality: N/A;  . None    . POLYPECTOMY  07/16/2018   Procedure: POLYPECTOMY;  Surgeon: KRonnette Juniper MD;  [facility];  Service: Gastroenterology;;    Social History    Socioeconomic History  . Marital status: Single    Spouse name: Not on file  . Number of children: 0  . Years of education: 185 . Highest education level: Not on file  Occupational History  . Occupation: unemployed  Tobacco Use  . Smoking status: Never Smoker  . Smokeless tobacco: Never Used  Vaping Use  . Vaping Use: Never used  Substance and Sexual Activity  . Alcohol use: No  . Drug use: No  . Sexual activity: Not Currently    Birth control/protection: None  Other Topics Concern  . 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 But ran out of money and is no longer attending school. (Note patient has bipolar disorder).      [place]       Primary language is Arabic. Lives with mother and brother.   Social Determinants of  Health   Financial Resource Strain:   . Difficulty of Paying Living Expenses: Not on file  Food Insecurity:   . Worried About Charity fundraiser in the Last Year: Not on file  . Ran Out of Food in the Last Year: Not on file  Transportation Needs:   . Lack of Transportation (Medical): Not on file  . Lack of Transportation (Non-Medical): Not on file  Physical Activity:   . Days of Exercise per Week: Not on file  . Minutes of Exercise per Session: Not on file  Stress:   . Feeling of Stress : Not on file  Social Connections:   . Frequency of Communication with Friends and Family: Not on file  . Frequency of Social Gatherings with Friends and Family: Not on file  . Attends Religious Services: Not on file  . Active Member of Clubs or Organizations: Not on file  . Attends Archivist Meetings: Not on file  . Marital Status: Not on file  Intimate Partner Violence:   . Fear of Current or Ex-Partner: Not on file  . Emotionally Abused: Not on file  . Physically Abused: Not on file  .  Sexually Abused: Not on file    Family History  Problem Relation Age of Onset  . Heart failure Father   . Stroke Father   . Hypertension Mother   . Heart disease Maternal Grandfather     ROS: no fevers or chills, productive cough, hemoptysis, dysphasia, odynophagia, melena, hematochezia, dysuria, hematuria, rash, seizure activity, orthopnea, PND, pedal edema, claudication. Remaining systems are negative.  Physical Exam: Well-developed obese in no acute distress.  Skin is warm and dry.  HEENT is normal.  Neck is supple.  Chest is clear to auscultation with normal expansion.  Cardiovascular exam is regular rate and rhythm.  Abdominal exam nontender or distended. No masses palpated. Extremities show no edema. neuro grossly intact   A/P  1 paroxysmal atrial fibrillation- Continue beta-blocker and Xarelto.  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  3 chronic diastolic congestive heart failure-we will continue demadex at present dose.  Check potassium and renal function.  4 hypertension-patient's blood pressure is controlled.  Continue present medications.  5 chronic chest pain-as outlined previously patient had cardiac catheterization showing no coronary disease.  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  Plan to continue medical therapy.  6 morbid obesity-we discussed the importance of weight loss.  [name], MD

## 2020-05-17 ENCOUNTER — Telehealth: Payer: Self-pay

## 2020-05-17 NOTE — Telephone Encounter (Signed)
Call placed to patient to inquire if she picked up her CPAP machine.  Spoke to her mother who said that she missed the pick up appointment and it has been rescheduled for a date in September.  She did not remember the exact date but said that she has an email with the date of the appointment.

## 2020-05-21 ENCOUNTER — Encounter: Payer: Self-pay | Admitting: Cardiology

## 2020-05-21 ENCOUNTER — Ambulatory Visit (INDEPENDENT_AMBULATORY_CARE_PROVIDER_SITE_OTHER): Payer: Medicaid Other | Admitting: Cardiology

## 2020-05-21 ENCOUNTER — Other Ambulatory Visit: Payer: Self-pay

## 2020-05-21 VITALS — BP 138/82 | HR 97 | Ht 67.0 in | Wt 335.6 lb

## 2020-05-21 DIAGNOSIS — I10 Essential (primary) hypertension: Secondary | ICD-10-CM

## 2020-05-21 DIAGNOSIS — I480 Paroxysmal atrial fibrillation: Secondary | ICD-10-CM | POA: Diagnosis not present

## 2020-05-21 DIAGNOSIS — R072 Precordial pain: Secondary | ICD-10-CM | POA: Diagnosis not present

## 2020-05-21 NOTE — Patient Instructions (Signed)
Medication Instructions:  NO CHANGE *If you need a refill on your cardiac medications before your next appointment, please call your pharmacy*   Lab Work: If you have labs (blood work) drawn today and your tests are completely normal, you will receive your results only by:  MyChart Message (if you have MyChart) OR  A paper copy in the mail If you have any lab test that is abnormal or we need to change your treatment, we will call you to review the results   Follow-Up: At [facility], you and your health needs are our priority.  As part of our continuing mission to provide you with exceptional heart care, we have created designated Provider Care Teams.  These Care Teams include your primary Cardiologist (physician) and Advanced Practice Providers (APPs -  Physician Assistants and Nurse Practitioners) who all work together to provide you with the care you need, when you need it.  We recommend signing up for the patient portal called "MyChart".  Sign up information is provided on this After Visit Summary.  MyChart is used to connect with patients for Virtual Visits (Telemedicine).  Patients are able to view lab/test results, encounter notes, upcoming appointments, etc.  Non-urgent messages can be sent to your provider as well.   To learn more about what you can do with MyChart, go to [email or web address].    Your next appointment:    Your physician recommends that you schedule a follow-up appointment in: [place] physician wants you to follow-up in: [place] will receive a reminder letter in the mail two months in advance. If you don't receive a letter, please call our office to schedule the follow-up appointment.

## 2020-05-29 ENCOUNTER — Other Ambulatory Visit: Payer: Self-pay | Admitting: Nurse Practitioner

## 2020-05-29 DIAGNOSIS — I110 Hypertensive heart disease with heart failure: Secondary | ICD-10-CM

## 2020-05-29 DIAGNOSIS — E1165 Type 2 diabetes mellitus with hyperglycemia: Secondary | ICD-10-CM

## 2020-05-29 DIAGNOSIS — F32A Depression, unspecified: Secondary | ICD-10-CM

## 2020-05-29 DIAGNOSIS — F3162 Bipolar disorder, current episode mixed, moderate: Secondary | ICD-10-CM

## 2020-05-29 DIAGNOSIS — I10 Essential (primary) hypertension: Secondary | ICD-10-CM

## 2020-05-29 DIAGNOSIS — I4819 Other persistent atrial fibrillation: Secondary | ICD-10-CM

## 2020-05-29 NOTE — Telephone Encounter (Signed)
Medication Refill - Medication: Accu-Chek Softclix Lancets lancets [[id number]]   albuterol (PROAIR HFA) 108 (90 Base) MCG/ACT inhaler [[id number]]  amLODipine (NORVASC) 10 MG tablet [[id number]]  Blood Glucose Monitoring Suppl (ACCU-CHEK AVIVA PLUS) w/Device KIT [[id number]]  atorvastatin (LIPITOR) 40 MG tablet [[id number]]  Blood Glucose Monitoring Suppl (TRUE METRIX METER) w/Device KIT [[id number]]   Dulaglutide (TRULICITY) 1.57 WI/2.0BT SOPN [[id number]]   cferrous sulfate (FEROSUL) 325 (65 FE) MG tablet [[id number]]  arvedilol (COREG) 25 MG tablet [[id number]]   escitalopram (LEXAPRO) 10 MG tablet [[id number]  glipiZIDE (GLUCOTROL XL) 10 MG 24 hr tablet [[id number]]  glucose blood (ACCU-CHEK AVIVA PLUS) test strip [[id number]]  metFORMIN (GLUCOPHAGE) 1000 MG tablet [[id number]]  Misc. Devices MISC [[id number]]   oxybutynin (DITROPAN) 5 MG tablet [[id number]]   Potassium Chloride ER 20 MEQ TBCR [[id number]]   risperidone (RISPERDAL) 4 MG tablet [[id number]]   rivaroxaban (XARELTO) 20 MG TABS tablet [[id number]  torsemide (DEMADEX) 20 MG tablet [[id number]]   Vitamin D, Ergocalciferol, (DRISDOL) 1.25 MG (50000 UNIT) CAPS capsule [[id number]]    Insulin Pen Needle (B-D UF III MINI PEN NEEDLES) 31G X 5 MM MISC [[id number]]   Preferred Pharmacy (with phone number or street name):  [facility], [address]  [place] [address]  Phone: [phone] Fax: [phone]  Hours: Not open 24 hours     Agent: Please be advised that RX refills may take up to 3 business days. We ask that you follow-up with your pharmacy.

## 2020-06-04 MED FILL — metFORMIN HCL 1000 MG TABS: 1000 | 90 days supply | Qty: 180 | Fill #0

## 2020-06-26 ENCOUNTER — Other Ambulatory Visit: Payer: Self-pay | Admitting: Nurse Practitioner

## 2020-06-26 DIAGNOSIS — I10 Essential (primary) hypertension: Secondary | ICD-10-CM

## 2020-06-26 MED FILL — XARELTO 20 MG TABLET: 20 | 90 days supply | Qty: 90 | Fill #1

## 2020-06-26 MED FILL — ATORVASTATIN CALCIUM 40 MG: 40 | 90 days supply | Qty: 90 | Fill #1

## 2020-06-26 MED FILL — AMLODIPINE BESYLATE 10 MG T: 10 | 90 days supply | Qty: 90 | Fill #0

## 2020-06-26 NOTE — Telephone Encounter (Signed)
Requested medication (s) are due for refill today: Yes  Requested medication (s) are on the active medication list: No  Last refill:  05/23/19  Future visit scheduled: No  Notes to clinic:  Unable to refill, expired Rx     Requested Prescriptions  Pending Prescriptions Disp Refills   losartan (COZAAR) 100 MG tablet [Pharmacy Med Name: LOSARTAN POTASSIUM 100 MG T 100 Tablet] 90 tablet 3    Sig: Take 1 tablet (100 mg total) by mouth daily.      Cardiovascular:  Angiotensin Receptor Blockers Passed - 06/26/2020  2:18 PM      Passed - Cr in normal range and within 180 days    Creat  Date Value Ref Range Status  08/06/2016 0.81 0.50 - 1.10 mg/dL Final   Creatinine, Ser  Date Value Ref Range Status  02/27/2020 0.92 0.57 - 1.00 mg/dL Final   Creatinine, Urine  Date Value Ref Range Status  08/06/2018 197.25 mg/dL Final    Comment:    Performed at [facility], [place] [address], [place], [place] [address]          Passed - K in normal range and within 180 days    Potassium  Date Value Ref Range Status  02/27/2020 4.3 3.5 - 5.2 mmol/L Final          Passed - Patient is not pregnant      Passed - Last BP in normal range    BP Readings from Last 1 Encounters:  05/21/20 138/82          Passed - Valid encounter within last 6 months    Recent Outpatient Visits           3 months ago Uncontrolled type 2 diabetes mellitus with hyperglycemia [facility])   [place], [place] W, NP   7 months ago Uncontrolled type 2 diabetes mellitus with hyperglycemia [facility])   [place] [name], NP   11 months ago Need for Tdap vaccination   [place], Stephen L, RPH-CPP   11 months ago Uncontrolled type 2 diabetes mellitus with hyperglycemia [facility])   [place], Vernia Buff, NP   11 months ago Depression, unspecified  depression type   [place] Ladell Pier, MD

## 2020-07-02 ENCOUNTER — Other Ambulatory Visit: Payer: Self-pay | Admitting: Nurse Practitioner

## 2020-07-02 ENCOUNTER — Ambulatory Visit: Payer: Medicaid Other | Attending: Nurse Practitioner | Admitting: Nurse Practitioner

## 2020-07-02 ENCOUNTER — Other Ambulatory Visit: Payer: Self-pay

## 2020-07-02 VITALS — BP 128/90 | HR 94 | Temp 97.7°F | Ht 67.0 in | Wt 332.0 lb

## 2020-07-02 DIAGNOSIS — F32A Depression, unspecified: Secondary | ICD-10-CM

## 2020-07-02 DIAGNOSIS — I10 Essential (primary) hypertension: Secondary | ICD-10-CM | POA: Diagnosis not present

## 2020-07-02 DIAGNOSIS — I5032 Chronic diastolic (congestive) heart failure: Secondary | ICD-10-CM

## 2020-07-02 DIAGNOSIS — N393 Stress incontinence (female) (male): Secondary | ICD-10-CM

## 2020-07-02 DIAGNOSIS — D649 Anemia, unspecified: Secondary | ICD-10-CM | POA: Diagnosis not present

## 2020-07-02 DIAGNOSIS — I110 Hypertensive heart disease with heart failure: Secondary | ICD-10-CM

## 2020-07-02 DIAGNOSIS — J849 Interstitial pulmonary disease, unspecified: Secondary | ICD-10-CM | POA: Diagnosis not present

## 2020-07-02 DIAGNOSIS — R5383 Other fatigue: Secondary | ICD-10-CM | POA: Diagnosis not present

## 2020-07-02 DIAGNOSIS — E1165 Type 2 diabetes mellitus with hyperglycemia: Secondary | ICD-10-CM | POA: Diagnosis not present

## 2020-07-02 LAB — POCT GLYCOSYLATED HEMOGLOBIN (HGB A1C): Hemoglobin A1C: 7.2 % — AB (ref 4.0–5.6)

## 2020-07-02 LAB — POCT URINALYSIS DIP (CLINITEK)
Glucose, UA: NEGATIVE mg/dL
Nitrite, UA: NEGATIVE
POC PROTEIN,UA: >=300 — AB
Spec Grav, UA: 1.025 (ref 1.010–1.025)
Urobilinogen, UA: 1 E.U./dL
pH, UA: 6.5 (ref 5.0–8.0)

## 2020-07-02 LAB — GLUCOSE, POCT (MANUAL RESULT ENTRY): POC Glucose: 164 mg/dl — AB (ref 70–99)

## 2020-07-02 MED ORDER — FERROUS SULFATE 325 (65 FE) MG PO TABS: 325.0000 mg | ORAL_TABLET | Freq: Two times a day (BID) | ORAL | 1 refills | Status: DC

## 2020-07-02 MED ORDER — INCONTINENCE BRIEF LARGE MISC: 6 refills | Status: DC

## 2020-07-02 MED ORDER — ATORVASTATIN CALCIUM 40 MG PO TABS: 40.0000 mg | ORAL_TABLET | Freq: Every day | ORAL | 2 refills | Status: DC

## 2020-07-02 MED ORDER — GLIPIZIDE ER 10 MG PO TB24: 10.0000 mg | ORAL_TABLET | Freq: Every evening | ORAL | 1 refills | Status: DC

## 2020-07-02 MED ORDER — METFORMIN HCL 1000 MG PO TABS: 1000.0000 mg | ORAL_TABLET | Freq: Two times a day (BID) | ORAL | 2 refills | Status: DC

## 2020-07-02 MED ORDER — AMLODIPINE BESYLATE 10 MG PO TABS: 10.0000 mg | ORAL_TABLET | Freq: Every day | ORAL | 1 refills | Status: DC

## 2020-07-02 MED ORDER — ESCITALOPRAM OXALATE 10 MG PO TABS: 10.0000 mg | ORAL_TABLET | Freq: Every day | ORAL | 1 refills | Status: DC

## 2020-07-02 MED ORDER — CARVEDILOL 25 MG PO TABS: 25.0000 mg | ORAL_TABLET | Freq: Two times a day (BID) | ORAL | 1 refills | Status: DC

## 2020-07-02 MED ORDER — POTASSIUM CHLORIDE ER 20 MEQ PO TBCR: 20.0000 meq | EXTENDED_RELEASE_TABLET | Freq: Two times a day (BID) | ORAL | 11 refills | Status: DC

## 2020-07-02 MED FILL — ESCITALOPRAM 10 MG TABLET: 10 | 90 days supply | Qty: 90 | Fill #0

## 2020-07-02 MED FILL — POTASSIUM CL ER 20 MEQ TAB: 20 | 30 days supply | Qty: 60 | Fill #0

## 2020-07-02 NOTE — Progress Notes (Signed)
Assessment & Plan:  [name] was seen today for follow-up.  Diagnoses and all orders for this visit:  Uncontrolled type 2 diabetes mellitus with hyperglycemia (HCC) -     Glucose (CBG) -     HgB A1c -     Microalbumin/Creatinine Ratio, Urine -     glipiZIDE (GLUCOTROL XL) 10 MG 24 hr tablet; Take 1 tablet (10 mg total) by mouth every evening. -     atorvastatin (LIPITOR) 40 MG tablet; Take 1 tablet (40 mg total) by mouth daily at 6 PM. -     metFORMIN (GLUCOPHAGE) 1000 MG tablet; Take 1 tablet (1,000 mg total) by mouth 2 (two) times daily with a meal. -     POCT URINALYSIS DIP (CLINITEK) Continue blood sugar control as discussed in office today, low carbohydrate diet, and regular physical exercise as tolerated, 150 minutes per week (30 min each day, 5 days per week, or 50 min 3 days per week). Keep blood sugar logs with fasting goal of 90-130 mg/dl, post prandial (after you eat) less than 180.  For Hypoglycemia: BS <60 and Hyperglycemia BS >400; contact the clinic ASAP. Annual eye exams and foot exams are recommended.   Depression, unspecified depression type -     escitalopram (LEXAPRO) 10 MG tablet; Take 1 tablet (10 mg total) by mouth daily.  Hypertensive heart disease with chronic diastolic congestive heart failure (HCC) -     carvedilol (COREG) 25 MG tablet; Take 1 tablet (25 mg total) by mouth 2 (two) times daily with a meal. -     amLODipine (NORVASC) 10 MG tablet; Take 1 tablet (10 mg total) by mouth daily. Continue all antihypertensives as prescribed.  Remember to bring in your blood pressure log with you for your follow up appointment.  DASH/Mediterranean Diets are healthier choices for HTN.   Essential hypertension -     Potassium Chloride ER 20 MEQ TBCR; Take 20 mEq by mouth 2 (two) times daily.  Stress incontinence in female -     Incontinence Supply Disposable (INCONTINENCE BRIEF LARGE) MISC; Please provide patient with insurance approved incontinence supplies/briefs -      Ambulatory referral to Urology  Other fatigue -     ferrous sulfate (FEROSUL) 325 (65 FE) MG tablet; Take 1 tablet (325 mg total) by mouth 2 (two) times daily.  Anemia, unspecified type -     ferrous sulfate (FEROSUL) 325 (65 FE) MG tablet; Take 1 tablet (325 mg total) by mouth 2 (two) times daily.    Patient has been counseled on age-appropriate routine health concerns for screening and prevention. These are reviewed and up-to-date. Referrals have been placed accordingly. Immunizations are up-to-date or declined.    Subjective:   Chief Complaint  Patient presents with  . Follow-up    Pt. is here for dibaetes follow up.    HPI [name] [age] y.o. female presents to office today for follow up.  PMH significant for:  AonC diastolic congestive heart failure (11/02/2013), Benign essential HTN (11/28/2013), Bipolar disease, chronic, Cor pulmonale, History of thyrotoxicosis, DM2,  Mediastinal adenopathy,  Morbid obesity with BMI >50, OSA (obstructive sleep apnea) DOES NOT USE BIPAP,   Persistent Afib (12/09/2017), Pulmonary HTN, moderate to severe (11/03/2013),  SVT (12/06/2013)  HTN Well controlled today with carvedilol 25 mg BID and amlodipine 30m daily. She takes torsemide 80 mg BID for heart failure and edema. She has chronic shortness of breath. Sats did not drop below 92% today with walk test on RA.  Denies chest pain.  BP Readings from Last 3 Encounters:  07/02/20 128/90  05/21/20 138/82  03/18/20 120/74   DM TYPE 2 Improved. Down from 8.7 to 7.2 today. Her weight is also down 3 lbs however this is not intentional. She is currently taking metformin 1000 mg BID, glipizide 10 mg daily.  LDL at goal Lab Results  Component Value Date   HGBA1C 7.2 (A) 07/02/2020   Lab Results  Component Value Date   LDLCALC 45 12/16/2019    Stress Incontinence  Worsening. UA does not indicate UTI. Likely related to weight and diuretic.She has uncontrollable leakage of urine. Denies hematuria or  flank pain.    uReview of Systems  Constitutional: Negative for fever, malaise/fatigue and weight loss.  HENT: Negative.  Negative for nosebleeds.   Eyes: Negative.  Negative for blurred vision, double vision and photophobia.  Respiratory: Positive for shortness of breath. Negative for cough and wheezing.   Cardiovascular: Negative.  Negative for chest pain, palpitations and leg swelling.  Gastrointestinal: Negative.  Negative for heartburn, nausea and vomiting.  Genitourinary: Negative for flank pain and hematuria.       SEE HPI  Musculoskeletal: Negative.  Negative for myalgias.  Neurological: Negative.  Negative for dizziness, focal weakness, seizures and headaches.  Psychiatric/Behavioral: Positive for depression. Negative for suicidal ideas.    Past Medical History:  Diagnosis Date  . Acute on chronic diastolic congestive heart failure ([place]) 11/02/2013   10/03/2015, 11/13/2015, 08/03/2017  . Benign essential HTN 11/28/2013  . Bipolar disease, chronic ([place])   . Chest pain    a. 2012 Myoview: EF 63%, no isch/infarct;  b. 04/2016 Lexiscan MV: EF 73%, no ischemia/infarct-->Low risk.  . Chronic diastolic CHF (congestive heart failure) ([place]) 07/23/2011   a. 2015 Echo: EF 55-60%, Gr2 DD;  b. 09/2015 Echo: EF 60-65%, no rwma, mod dil LA, PASP 89mHg.  . Cor pulmonale (chronic) ([place]   . History of thyrotoxicosis   . HTN (hypertension) 11/28/2013  . Hypertensive heart disease 10/18/2013  . Hypoglycemia   . Insulin dependent type 2 diabetes mellitus, uncontrolled ([place]   . Mediastinal adenopathy   . Morbid obesity due to excess calories ([place] 02/19/2011  . Morbid obesity with BMI of 50.0-59.9, adult ([place]   . OSA (obstructive sleep apnea) 03/06/2011  . Persistent atrial fibrillation ([place] 12/09/2017  . Pulmonary HTN, moderate to severe 11/03/2013  . Sinusitis, chronic 01/02/2015  . SVT (supraventricular tachycardia) ([place] 12/06/2013  . Uncontrolled type 2 diabetes mellitus with hyperglycemia  ([facility]     Past Surgical History:  Procedure Laterality Date  . CARDIOVERSION N/A 04/05/2018   Procedure: CARDIOVERSION;  Surgeon: CLelon Perla MD;  [facility];  Service: Cardiovascular;  Laterality: N/A;  . COLONOSCOPY WITH PROPOFOL Left 07/16/2018   Procedure: COLONOSCOPY WITH PROPOFOL;  Surgeon: KRonnette Juniper MD;  [facility];  Service: Gastroenterology;  Laterality: Left;  . LEFT HEART CATH AND CORONARY ANGIOGRAPHY N/A 08/04/2018   Procedure: LEFT HEART CATH AND CORONARY ANGIOGRAPHY;  Surgeon: SBelva Crome MD;  [facility];  Service: Cardiovascular;  Laterality: N/A;  . None    . POLYPECTOMY  07/16/2018   Procedure: POLYPECTOMY;  Surgeon: KRonnette Juniper MD;  [facility];  Service: Gastroenterology;;    Family History  Problem Relation Age of Onset  . Heart failure Father   . Stroke Father   . Hypertension Mother   . Heart disease Maternal Grandfather     Social History Reviewed with no  changes to be made today.   Outpatient Medications Prior to Visit  Medication Sig Dispense Refill  . Accu-Chek Softclix Lancets lancets USE AS DIRECTED TO TEST BLOOD SUGAR ONCE DAILY 100 each 12  . albuterol (PROAIR HFA) 108 (90 Base) MCG/ACT inhaler INHALE 1 TO 2 PUFFS EVERY 6 HOURS AS NEEDED FOR WHEEZING/ SHORTNESS OF BREATH 8.5 g 2  . Blood Glucose Monitoring Suppl (ACCU-CHEK AVIVA PLUS) w/Device KIT 1 each by Does not apply route daily. USE AS DIRECTED TO TEST BLOOD SUGAR ONCE DAILY 1 kit 0  . Blood Glucose Monitoring Suppl (TRUE METRIX METER) w/Device KIT Use as instructed. Check blood glucose levels by fingerstick twice per day 1 kit 0  . glucose blood (ACCU-CHEK AVIVA PLUS) test strip USE AS DIRECTED TO TEST BLOOD SUGAR ONCE DAILY 100 each 12  . Insulin Pen Needle (B-D UF III MINI PEN NEEDLES) 31G X 5 MM MISC Use as instructed. Inject into the skin once weekly. E11.65 100 each 3  . Misc. Devices MISC Please provide BiPAP machine with the  following: Settings 22/18 cm H2O. Medium  size [employer] Full Face Mask Simplus mask and heated  humidification. 1 each 0  . oxybutynin (DITROPAN) 5 MG tablet Take 5 mg by mouth 3 (three) times daily.    . risperidone (RISPERDAL) 4 MG tablet Take 1 tablet (4 mg total) by mouth daily. 90 tablet 1  . rivaroxaban (XARELTO) 20 MG TABS tablet TAKE 1 TABLET (20 MG TOTAL) BY MOUTH DAILY WITH SUPPER. 90 tablet 1  . torsemide (DEMADEX) 20 MG tablet TAKE 4 TABLETS (80 MG TOTAL) BY MOUTH 2 (TWO) TIMES DAILY. 240 tablet 11  . Vitamin D, Ergocalciferol, (DRISDOL) 1.25 MG (50000 UNIT) CAPS capsule Take 1 capsule (50,000 Units total) by mouth every 7 (seven) days. 12 capsule 1  . amLODipine (NORVASC) 10 MG tablet Take 1 tablet (10 mg total) by mouth daily. 90 tablet 1  . atorvastatin (LIPITOR) 40 MG tablet Take 1 tablet (40 mg total) by mouth daily at 6 PM. 90 tablet 2  . escitalopram (LEXAPRO) 10 MG tablet 1/2 ab PO daily x 2 wks then 1 tab PO daily 30 tablet 1  . ferrous sulfate (FEROSUL) 325 (65 FE) MG tablet Take 1 tablet (325 mg total) by mouth 2 (two) times daily. 60 tablet 3  . metFORMIN (GLUCOPHAGE) 1000 MG tablet Take 1 tablet (1,000 mg total) by mouth 2 (two) times daily with a meal. 180 tablet 2  . Potassium Chloride ER 20 MEQ TBCR Take 20 mEq by mouth 2 (two) times daily. 60 tablet 11  . carvedilol (COREG) 25 MG tablet Take 1 tablet (25 mg total) by mouth 2 (two) times daily with a meal. 180 tablet 1  . glipiZIDE (GLUCOTROL XL) 10 MG 24 hr tablet Take 1 tablet (10 mg total) by mouth every evening. 90 tablet 1   No facility-administered medications prior to visit.    Allergies  Allergen Reactions  . Acetaminophen Other (See Comments)    Seizure-like "fits" as a child  . Caffeine     Tense, anxiety, increased urination  . [place] [Dapagliflozin] Other (See Comments)    Hallucinations, drop in blood sugar  . Lisinopril Rash    Rash with lisinopril; but fosinopril is ok per patient        Objective:    BP 128/90 (BP Location: Left Arm, Patient Position: Sitting, Cuff Size: Large)   Pulse 94   Temp 97.7 F (36.5 C) (Temporal)  Ht '5\' 7"'  (1.702 m)   Wt (!) 332 lb (150.6 kg)   SpO2 95%   BMI 52.00 kg/m  Wt Readings from Last 3 Encounters:  07/02/20 (!) 332 lb (150.6 kg)  05/21/20 (!) 335 lb 9.6 oz (152.2 kg)  03/18/20 (!) 335 lb (152 kg)    Physical Exam       Patient has been counseled extensively about nutrition and exercise as well as the importance of adherence with medications and regular follow-up. The patient was given clear instructions to go to ER or return to medical center if symptoms don't improve, worsen or new problems develop. The patient verbalized understanding.   Follow-up: Return in about 3 months (around 10/02/2020).   [name], FNP-BC [facility] and [place], [place]   07/04/2020, 1:10 PM

## 2020-07-03 LAB — MICROALBUMIN / CREATININE URINE RATIO
Creatinine, Urine: 180.6 mg/dL
Microalb/Creat Ratio: 1378 mg/g creat — ABNORMAL HIGH (ref 0–29)
Microalbumin, Urine: 2488.8 ug/mL

## 2020-07-04 ENCOUNTER — Encounter: Payer: Self-pay | Admitting: Nurse Practitioner

## 2020-07-09 ENCOUNTER — Other Ambulatory Visit: Payer: Self-pay | Admitting: Nurse Practitioner

## 2020-07-09 DIAGNOSIS — E1165 Type 2 diabetes mellitus with hyperglycemia: Secondary | ICD-10-CM

## 2020-07-09 MED ORDER — LOSARTAN POTASSIUM 25 MG PO TABS: 12.5000 mg | ORAL_TABLET | Freq: Every day | ORAL | 0 refills | Status: DC

## 2020-07-09 MED FILL — LOSARTAN POTASSIUM 25 MG TA: 25 | 90 days supply | Qty: 45 | Fill #0

## 2020-07-18 ENCOUNTER — Telehealth: Payer: Self-pay

## 2020-07-18 MED ORDER — MISC. DEVICES MISC: 0 refills | Status: DC

## 2020-07-18 NOTE — Telephone Encounter (Signed)
-----   Message from Eddie Dibbles, RN sent at 07/18/2020  2:57 PM EDT ----- Please generate script, fax to [facility] and inform the patient, thanks!

## 2020-07-18 NOTE — Telephone Encounter (Signed)
Will fax the script to [facility].

## 2020-07-19 MED FILL — LOSARTAN POTASSIUM 25 MG TA: 25 | 90 days supply | Qty: 45 | Fill #0

## 2020-07-21 ENCOUNTER — Other Ambulatory Visit: Payer: Self-pay | Admitting: Nurse Practitioner

## 2020-07-21 MED ORDER — BLOOD PRESSURE MONITOR DEVI: 0 refills | Status: DC

## 2020-07-25 ENCOUNTER — Encounter: Payer: Self-pay | Admitting: Physician Assistant

## 2020-07-25 ENCOUNTER — Telehealth (INDEPENDENT_AMBULATORY_CARE_PROVIDER_SITE_OTHER): Payer: Medicaid Other | Admitting: Physician Assistant

## 2020-07-25 DIAGNOSIS — K5289 Other specified noninfective gastroenteritis and colitis: Secondary | ICD-10-CM | POA: Diagnosis not present

## 2020-07-25 NOTE — Progress Notes (Signed)
Patient complains of diarrhea over the past 3 days and has chills Patient complains of eating and then running to the bathroom. Patient has taken chronic medication. Patient denies N/V Patient denies fevers or HA's.

## 2020-07-25 NOTE — Progress Notes (Signed)
Established Patient Office Visit  Subjective:  Patient ID: [name], female    DOB: 05-07-1966  Age: 54 y.o. MRN: [id number]  CC: No chief complaint on file.   Virtual Visit via Telephone Note  I connected with [name] on 07/25/20 at  2:30 PM EDT by telephone and verified that I am speaking with the correct person using two identifiers.  Location: Patient: Home Provider: Primary Care at [facility]   I discussed the limitations, risks, security and privacy concerns of performing an evaluation and management service by telephone and the availability of in person appointments. I also discussed with the patient that there may be a patient responsible charge related to this service. The patient expressed understanding and agreed to proceed.   History of Present Illness: States that she has been having episodes of diarrhea for the past  3 days, 3-4 times a day, nocturnal awakening, some cramping  Denies nausea or vomiting No sick contacts No recent antibiotics Has tried drinking milk with some relief     Observations/Objective: Medical history and current medications reviewed, no physical exam completed     Past Medical History:  Diagnosis Date  . Acute on chronic diastolic congestive heart failure ([place]) 11/02/2013   10/03/2015, 11/13/2015, 08/03/2017  . Benign essential HTN 11/28/2013  . Bipolar disease, chronic ([place])   . Chest pain    a. 2012 Myoview: EF 63%, no isch/infarct;  b. 04/2016 Lexiscan MV: EF 73%, no ischemia/infarct-->Low risk.  . Chronic diastolic CHF (congestive heart failure) ([place]) 07/23/2011   a. 2015 Echo: EF 55-60%, Gr2 DD;  b. 09/2015 Echo: EF 60-65%, no rwma, mod dil LA, PASP 75mHg.  . Cor pulmonale (chronic) ([place]   . History of thyrotoxicosis   . HTN (hypertension) 11/28/2013  . Hypertensive heart disease 10/18/2013  . Hypoglycemia   . Insulin dependent type 2 diabetes mellitus, uncontrolled ([place]   . Mediastinal adenopathy   . Morbid obesity  due to excess calories ([place] 02/19/2011  . Morbid obesity with BMI of 50.0-59.9, adult ([place]   . OSA (obstructive sleep apnea) 03/06/2011  . Persistent atrial fibrillation ([place] 12/09/2017  . Pulmonary HTN, moderate to severe 11/03/2013  . Sinusitis, chronic 01/02/2015  . SVT (supraventricular tachycardia) ([place] 12/06/2013  . Uncontrolled type 2 diabetes mellitus with hyperglycemia ([facility]     Past Surgical History:  Procedure Laterality Date  . CARDIOVERSION N/A 04/05/2018   Procedure: CARDIOVERSION;  Surgeon: CLelon Perla MD;  [facility];  Service: Cardiovascular;  Laterality: N/A;  . COLONOSCOPY WITH PROPOFOL Left 07/16/2018   Procedure: COLONOSCOPY WITH PROPOFOL;  Surgeon: KRonnette Juniper MD;  [facility];  Service: Gastroenterology;  Laterality: Left;  . LEFT HEART CATH AND CORONARY ANGIOGRAPHY N/A 08/04/2018   Procedure: LEFT HEART CATH AND CORONARY ANGIOGRAPHY;  Surgeon: SBelva Crome MD;  [facility];  Service: Cardiovascular;  Laterality: N/A;  . None    . POLYPECTOMY  07/16/2018   Procedure: POLYPECTOMY;  Surgeon: KRonnette Juniper MD;  [facility];  Service: Gastroenterology;;    Family History  Problem Relation Age of Onset  . Heart failure Father   . Stroke Father   . Hypertension Mother   . Heart disease Maternal Grandfather     Social History   Socioeconomic History  . Marital status: Single    Spouse name: Not on file  . Number of children: 0  . Years of education: 118 . Highest education level: Not on file  Occupational  History  . Occupation: unemployed  Tobacco Use  . Smoking status: Never Smoker  . Smokeless tobacco: Never Used  Vaping Use  . Vaping Use: Never used  Substance and Sexual Activity  . Alcohol use: No  . Drug use: No  . Sexual activity: Not Currently    Birth control/protection: None  Other Topics Concern  . 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 Then was enrolled in a MPH program at [employer]. But ran out of money and is no longer attending school. (Note patient has bipolar disorder).      Born in [place] but lived in [place] before coming back to [place].       Primary language is Arabic. Lives with mother and brother.   Social Determinants of Health   Financial Resource Strain:   . Difficulty of Paying Living Expenses: Not on file  Food Insecurity:   . Worried About Charity fundraiser in the Last Year: Not on file  . Ran Out of Food in the Last Year: Not on file  Transportation Needs:   . Lack of Transportation (Medical): Not on file  . Lack of Transportation (Non-Medical): Not on file  Physical Activity:   . Days of Exercise per Week: Not on file  . Minutes of Exercise per Session: Not on file  Stress:   . Feeling of Stress : Not on file  Social Connections:   . Frequency of Communication with Friends and Family: Not on file  . Frequency of Social Gatherings with Friends and Family: Not on file  . Attends Religious Services: Not on file  . Active Member of Clubs or Organizations: Not on file  . Attends Archivist Meetings: Not on file  . Marital Status: Not on file  Intimate Partner Violence:   . Fear of Current or Ex-Partner: Not on file  . Emotionally Abused: Not on file  . Physically Abused: Not on file  . Sexually Abused: Not on file    Outpatient Medications Prior to Visit  Medication Sig Dispense Refill  . Accu-Chek Softclix Lancets lancets USE AS DIRECTED TO TEST BLOOD SUGAR ONCE DAILY 100 each 12  . albuterol (PROAIR HFA) 108 (90 Base) MCG/ACT inhaler INHALE 1 TO 2 PUFFS EVERY 6 HOURS AS NEEDED FOR WHEEZING/ SHORTNESS OF BREATH 8.5 g 2  . amLODipine (NORVASC) 10 MG tablet Take 1 tablet (10 mg total) by mouth daily. 90 tablet 1  . atorvastatin (LIPITOR) 40 MG tablet Take 1 tablet (40 mg total) by mouth daily at 6 PM. 90 tablet 2  . Blood Glucose Monitoring Suppl (ACCU-CHEK AVIVA PLUS)  w/Device KIT 1 each by Does not apply route daily. USE AS DIRECTED TO TEST BLOOD SUGAR ONCE DAILY 1 kit 0  . Blood Glucose Monitoring Suppl (TRUE METRIX METER) w/Device KIT Use as instructed. Check blood glucose levels by fingerstick twice per day 1 kit 0  . Blood Pressure Monitor DEVI Please provide patient with insurance approved blood pressure monitor I10.0 1 each 0  . carvedilol (COREG) 25 MG tablet Take 1 tablet (25 mg total) by mouth 2 (two) times daily with a meal. 180 tablet 1  . escitalopram (LEXAPRO) 10 MG tablet Take 1 tablet (10 mg total) by mouth daily. 90 tablet 1  . ferrous sulfate (FEROSUL) 325 (65 FE) MG tablet Take 1 tablet (325 mg total) by mouth 2 (two) times daily. [place]  tablet 1  . glipiZIDE (GLUCOTROL XL) 10 MG 24 hr tablet Take 1 tablet (10 mg total) by mouth every evening. 90 tablet 1  . glucose blood (ACCU-CHEK AVIVA PLUS) test strip USE AS DIRECTED TO TEST BLOOD SUGAR ONCE DAILY 100 each 12  . Incontinence Supply Disposable (INCONTINENCE BRIEF LARGE) MISC Please provide patient with insurance approved incontinence supplies/briefs 18 each 6  . losartan (COZAAR) 25 MG tablet Take 0.5 tablets (12.5 mg total) by mouth daily. 45 tablet 0  . metFORMIN (GLUCOPHAGE) 1000 MG tablet Take 1 tablet (1,000 mg total) by mouth 2 (two) times daily with a meal. 180 tablet 2  . Misc. Devices MISC Please provide BiPAP machine with the following: Settings 22/18 cm H2O. Medium  size [employer] Full Face Mask Simplus mask and heated  humidification. 1 each 0  . Misc. Devices MISC Please provide patient a Blood Pressure Monitor w/ insurance approval. 1 Device 0  . oxybutynin (DITROPAN) 5 MG tablet Take 5 mg by mouth 3 (three) times daily.    . Potassium Chloride ER 20 MEQ TBCR Take 20 mEq by mouth 2 (two) times daily. 60 tablet 11  . risperidone (RISPERDAL) 4 MG tablet Take 1 tablet (4 mg total) by mouth daily. 90 tablet 1  . rivaroxaban (XARELTO) 20 MG TABS tablet TAKE 1 TABLET (20 MG  TOTAL) BY MOUTH DAILY WITH SUPPER. 90 tablet 1  . torsemide (DEMADEX) 20 MG tablet TAKE 4 TABLETS (80 MG TOTAL) BY MOUTH 2 (TWO) TIMES DAILY. 240 tablet 11  . Vitamin D, Ergocalciferol, (DRISDOL) 1.25 MG (50000 UNIT) CAPS capsule Take 1 capsule (50,000 Units total) by mouth every 7 (seven) days. 12 capsule 1   No facility-administered medications prior to visit.    Allergies  Allergen Reactions  . Acetaminophen Other (See Comments)    Seizure-like "fits" as a child  . Caffeine     Tense, anxiety, increased urination  . [place] [Dapagliflozin] Other (See Comments)    Hallucinations, drop in blood sugar  . Lisinopril Rash    Rash with lisinopril; but fosinopril is ok per patient    ROS Review of Systems  Constitutional: Negative for chills, fatigue and fever.  HENT: Negative.   Eyes: Negative.   Respiratory: Negative.   Cardiovascular: Negative.   Gastrointestinal: Negative for blood in stool, nausea and vomiting.  Endocrine: Negative.   Genitourinary: Negative.   Musculoskeletal: Negative.   Skin: Negative.   Allergic/Immunologic: Negative.   Neurological: Negative.   Hematological: Negative.   Psychiatric/Behavioral: Negative.       Objective:     There were no vitals taken for this visit. Wt Readings from Last 3 Encounters:  07/02/20 (!) 332 lb (150.6 kg)  05/21/20 (!) 335 lb 9.6 oz (152.2 kg)  03/18/20 (!) 335 lb (152 kg)     Health Maintenance Due  Topic Date Due  . PAP SMEAR-Modifier  07/28/2008  . MAMMOGRAM  02/22/2018    There are no preventive care reminders to display for this patient.  Lab Results  Component Value Date   TSH 2.910 12/09/2017   Lab Results  Component Value Date   WBC 5.5 12/30/2019   HGB 11.1 (L) 12/30/2019   HCT 37.1 12/30/2019   MCV 87.7 12/30/2019   PLT 197 12/30/2019   Lab Results  Component Value Date   NA 137 02/27/2020   K 4.3 02/27/2020   CO2 26 02/27/2020   GLUCOSE 270 (H) 02/27/2020   BUN 13 02/27/2020  CREATININE 0.92 02/27/2020   BILITOT 0.5 12/30/2019   ALKPHOS 101 12/30/2019   AST 20 12/30/2019   ALT 11 12/30/2019   PROT 7.4 12/30/2019   ALBUMIN 3.5 12/30/2019   CALCIUM 9.6 02/27/2020   ANIONGAP 10 12/30/2019   GFR 117.88 12/25/2013   Lab Results  Component Value Date   CHOL 86 12/16/2019   Lab Results  Component Value Date   HDL 30 (L) 12/16/2019   Lab Results  Component Value Date   LDLCALC 45 12/16/2019   Lab Results  Component Value Date   TRIG 57 12/16/2019   Lab Results  Component Value Date   CHOLHDL 2.9 12/16/2019   Lab Results  Component Value Date   HGBA1C 7.2 (A) 07/02/2020      Assessment & Plan:   Problem List Items Addressed This Visit    None     Assessment and Plan: 1. Other noninfectious gastroenteritis Patient education given, BRAT diet, increase hydration, rest.  The patient was given clear instructions to go to ER or return to medical center if symptoms don't improve, worsen or new problems develop. The patient verbalized understanding.    Follow Up Instructions:    I discussed the assessment and treatment plan with the patient. The patient was provided an opportunity to ask questions and all were answered. The patient agreed with the plan and demonstrated an understanding of the instructions.   The patient was advised to call back or seek an in-person evaluation if the symptoms worsen or if the condition fails to improve as anticipated.  I provided 21 minutes of non-face-to-face time during this encounter.   Ticia Virgo S Mayers, PA-C   No orders of the defined types were placed in this encounter.   Follow-up: No follow-ups on file.    Loraine Grip Mayers, PA-C

## 2020-07-31 MED FILL — glipiZIDE XL 10 MG TB24: 10 | 90 days supply | Qty: 90 | Fill #0

## 2020-07-31 MED FILL — FERROUS SULFATE 325 MG TAB: 325 (65 FE) | 90 days supply | Qty: 180 | Fill #0

## 2020-07-31 MED FILL — TORSEMIDE 20 MG TABLET: 20 | 30 days supply | Qty: 240 | Fill #3

## 2020-07-31 MED FILL — POTASSIUM CL ER 20 MEQ TAB: 20 | 30 days supply | Qty: 60 | Fill #1

## 2020-07-31 MED FILL — CARVEDILOL 25 MG TABLET: 25 | 90 days supply | Qty: 180 | Fill #0

## 2020-07-31 MED FILL — risperiDONE 4 MG TABS: 4 | 90 days supply | Qty: 90 | Fill #1

## 2020-08-07 ENCOUNTER — Ambulatory Visit (INDEPENDENT_AMBULATORY_CARE_PROVIDER_SITE_OTHER): Payer: Medicaid Other | Admitting: Cardiology

## 2020-08-07 ENCOUNTER — Ambulatory Visit: Payer: Medicaid Other | Attending: Nurse Practitioner | Admitting: Nurse Practitioner

## 2020-08-07 ENCOUNTER — Encounter: Payer: Self-pay | Admitting: Cardiology

## 2020-08-07 ENCOUNTER — Emergency Department (HOSPITAL_COMMUNITY)
Admission: EM | Admit: 2020-08-07 | Discharge: 2020-08-07 | Disposition: A | Discharge status: Home / Self Care | Payer: Medicaid Other | Attending: Emergency Medicine | Admitting: Emergency Medicine

## 2020-08-07 ENCOUNTER — Emergency Department (HOSPITAL_COMMUNITY): Payer: Medicaid Other

## 2020-08-07 ENCOUNTER — Other Ambulatory Visit: Payer: Self-pay

## 2020-08-07 ENCOUNTER — Telehealth: Payer: Self-pay | Admitting: Cardiology

## 2020-08-07 ENCOUNTER — Telehealth: Payer: Self-pay | Admitting: Nurse Practitioner

## 2020-08-07 ENCOUNTER — Encounter: Payer: Self-pay | Admitting: Nurse Practitioner

## 2020-08-07 VITALS — BP 135/77 | HR 91 | Ht 67.0 in | Wt 337.0 lb

## 2020-08-07 VITALS — BP 115/80 | HR 97 | Temp 97.7°F | Ht 67.0 in | Wt 336.4 lb

## 2020-08-07 DIAGNOSIS — I4891 Unspecified atrial fibrillation: Secondary | ICD-10-CM | POA: Diagnosis not present

## 2020-08-07 DIAGNOSIS — I5033 Acute on chronic diastolic (congestive) heart failure: Secondary | ICD-10-CM | POA: Diagnosis not present

## 2020-08-07 DIAGNOSIS — R0602 Shortness of breath: Secondary | ICD-10-CM | POA: Diagnosis not present

## 2020-08-07 DIAGNOSIS — E1165 Type 2 diabetes mellitus with hyperglycemia: Secondary | ICD-10-CM

## 2020-08-07 DIAGNOSIS — G4733 Obstructive sleep apnea (adult) (pediatric): Secondary | ICD-10-CM | POA: Diagnosis not present

## 2020-08-07 DIAGNOSIS — I5043 Acute on chronic combined systolic (congestive) and diastolic (congestive) heart failure: Secondary | ICD-10-CM | POA: Diagnosis not present

## 2020-08-07 DIAGNOSIS — I110 Hypertensive heart disease with heart failure: Secondary | ICD-10-CM | POA: Diagnosis not present

## 2020-08-07 DIAGNOSIS — R079 Chest pain, unspecified: Secondary | ICD-10-CM | POA: Diagnosis not present

## 2020-08-07 DIAGNOSIS — Z79899 Other long term (current) drug therapy: Secondary | ICD-10-CM | POA: Diagnosis not present

## 2020-08-07 DIAGNOSIS — I10 Essential (primary) hypertension: Secondary | ICD-10-CM

## 2020-08-07 DIAGNOSIS — I4819 Other persistent atrial fibrillation: Secondary | ICD-10-CM

## 2020-08-07 DIAGNOSIS — Z794 Long term (current) use of insulin: Secondary | ICD-10-CM | POA: Insufficient documentation

## 2020-08-07 DIAGNOSIS — Z7901 Long term (current) use of anticoagulants: Secondary | ICD-10-CM | POA: Diagnosis not present

## 2020-08-07 DIAGNOSIS — G8929 Other chronic pain: Secondary | ICD-10-CM | POA: Diagnosis not present

## 2020-08-07 DIAGNOSIS — J90 Pleural effusion, not elsewhere classified: Secondary | ICD-10-CM | POA: Diagnosis not present

## 2020-08-07 LAB — I-STAT VENOUS BLOOD GAS, ED
Acid-Base Excess: 6 mmol/L — ABNORMAL HIGH (ref 0.0–2.0)
Bicarbonate: 29.7 mmol/L — ABNORMAL HIGH (ref 20.0–28.0)
Calcium, Ion: 1.02 mmol/L — ABNORMAL LOW (ref 1.15–1.40)
HCT: 36 % (ref 36.0–46.0)
Hemoglobin: 12.2 g/dL (ref 12.0–15.0)
O2 Saturation: 90 %
Potassium: 3.7 mmol/L (ref 3.5–5.1)
Sodium: 139 mmol/L (ref 135–145)
TCO2: 31 mmol/L (ref 22–32)
pCO2, Ven: 40.3 mmHg — ABNORMAL LOW (ref 44.0–60.0)
pH, Ven: 7.476 — ABNORMAL HIGH (ref 7.250–7.430)
pO2, Ven: 56 mmHg — ABNORMAL HIGH (ref 32.0–45.0)

## 2020-08-07 LAB — CBC
HCT: 36.8 % (ref 36.0–46.0)
Hemoglobin: 10.9 g/dL — ABNORMAL LOW (ref 12.0–15.0)
MCH: 25.5 pg — ABNORMAL LOW (ref 26.0–34.0)
MCHC: 29.6 g/dL — ABNORMAL LOW (ref 30.0–36.0)
MCV: 86 fL (ref 80.0–100.0)
Platelets: 252 10*3/uL (ref 150–400)
RBC: 4.28 MIL/uL (ref 3.87–5.11)
RDW: 15.9 % — ABNORMAL HIGH (ref 11.5–15.5)
WBC: 6.8 10*3/uL (ref 4.0–10.5)
nRBC: 0 % (ref 0.0–0.2)

## 2020-08-07 LAB — BASIC METABOLIC PANEL
Anion gap: 10 (ref 5–15)
BUN: 25 mg/dL — ABNORMAL HIGH (ref 6–20)
CO2: 28 mmol/L (ref 22–32)
Calcium: 9.2 mg/dL (ref 8.9–10.3)
Chloride: 99 mmol/L (ref 98–111)
Creatinine, Ser: 1.08 mg/dL — ABNORMAL HIGH (ref 0.44–1.00)
GFR, Estimated: >60 mL/min (ref >60)
Glucose, Bld: 305 mg/dL — ABNORMAL HIGH (ref 70–99)
Potassium: 3.7 mmol/L (ref 3.5–5.1)
Sodium: 137 mmol/L (ref 135–145)

## 2020-08-07 LAB — TROPONIN I (HIGH SENSITIVITY): Troponin I (High Sensitivity): 4 ng/L (ref <18)

## 2020-08-07 LAB — GLUCOSE, POCT (MANUAL RESULT ENTRY): POC Glucose: 298 mg/dl — AB (ref 70–99)

## 2020-08-07 LAB — BRAIN NATRIURETIC PEPTIDE: B Natriuretic Peptide: 259.4 pg/mL — ABNORMAL HIGH (ref 0.0–100.0)

## 2020-08-07 MED ORDER — FUROSEMIDE 10 MG/ML IJ SOLN
80.0000 mg | Freq: Once | INTRAMUSCULAR | Status: AC
  Administered 2020-08-07: 80 mg via INTRAVENOUS
  Filled 2020-08-07: qty 8

## 2020-08-07 MED ORDER — TORSEMIDE 20 MG PO TABS
80.0000 mg | ORAL_TABLET | Freq: Two times a day (BID) | ORAL | 11 refills | Status: DC
Start: 2020-08-07 — End: 2020-08-07

## 2020-08-07 NOTE — ED Provider Notes (Signed)
[facility] EMERGENCY DEPARTMENT Provider Note   CSN: [id number] Arrival date & time: 08/07/20  1128     History Chief Complaint  Patient presents with  . Chest Pain    [name] is a 54 y.o. female.  She has a history of A. fib, CHF.  [facility] and they found her with complaint of 5 days of mild chest pressure along with shortness of breath drowsiness fatigue dyspnea on exertion.  They referred her to the emergency department for further work-up.  She said she has had the symptoms before and it is usually her congestive heart failure.  I asked her she has been taking her medicine she says she has.  Per the NP note from [facility] she has not been taking her full dose of Demadex as prescribed.  No fevers or chills but she says she feels hot.  No cough no abdominal pain.  She has some peripheral edema but better than her usual she says.  The history is provided by the patient.  Chest Pain Pain location:  Substernal area Pain quality: pressure   Pain radiates to:  Does not radiate Pain severity:  Mild Onset quality:  Gradual Timing:  Intermittent Progression:  Unchanged Chronicity:  New Context: breathing   Relieved by:  Nothing Worsened by:  Exertion Ineffective treatments:  None tried Associated symptoms: fatigue, lower extremity edema and shortness of breath   Associated symptoms: no abdominal pain, no cough, no fever, no headache, no nausea and no vomiting   Shortness of breath:    Severity:  Moderate   Onset quality:  Gradual   Timing:  Constant   Progression:  Worsening Risk factors: diabetes mellitus and hypertension        Past Medical History:  Diagnosis Date  . Acute on chronic diastolic congestive heart failure ([place]) 11/02/2013   10/03/2015, 11/13/2015, 08/03/2017  . Benign essential HTN 11/28/2013  . Bipolar disease, chronic ([place])   . Chest pain    a. 2012 Myoview: EF 63%, no  isch/infarct;  b. 04/2016 Lexiscan MV: EF 73%, no ischemia/infarct-->Low risk.  . Chronic diastolic CHF (congestive heart failure) ([place]) 07/23/2011   a. 2015 Echo: EF 55-60%, Gr2 DD;  b. 09/2015 Echo: EF 60-65%, no rwma, mod dil LA, PASP 56mHg.  . Cor pulmonale (chronic) ([place]   . History of thyrotoxicosis   . HTN (hypertension) 11/28/2013  . Hypertensive heart disease 10/18/2013  . Hypoglycemia   . Insulin dependent type 2 diabetes mellitus, uncontrolled ([place]   . Mediastinal adenopathy   . Morbid obesity due to excess calories ([place] 02/19/2011  . Morbid obesity with BMI of 50.0-59.9, adult ([place]   . OSA (obstructive sleep apnea) 03/06/2011  . Persistent atrial fibrillation ([place] 12/09/2017  . Pulmonary HTN, moderate to severe 11/03/2013  . Sinusitis, chronic 01/02/2015  . SVT (supraventricular tachycardia) ([place] 12/06/2013  . Uncontrolled type 2 diabetes mellitus with hyperglycemia ([facility]     Patient Active Problem List   Diagnosis Date Noted  . Obesity hypoventilation syndrome ([place] 12/15/2019  . Depression 12/15/2019  . Class 3 obesity 12/10/2019  . Acute exacerbation of CHF (congestive heart failure) ([place] 12/09/2019  . ILD (interstitial lung disease) ([place] 05/28/2019  . Exertional angina ([place] 05/28/2019  . AKI (acute kidney injury) ([place]   . Acute CHF (congestive heart failure) ([place] 08/03/2018  . A-fib ([place] 08/03/2018  . Vitamin B12 deficiency 07/20/2018  . Hematochezia 07/14/2018  .  Acute posthemorrhagic anemia 07/14/2018  . GIB (gastrointestinal bleeding) 07/14/2018  . Chronic anticoagulation 07/14/2018  . Persistent atrial fibrillation   . Uncontrolled type 2 diabetes mellitus with hyperglycemia ([place])   . Hypokalemia   . Cor pulmonale (chronic) ([place])   . 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 Pyrexia   . Dyspnea 11/13/2015  . Mediastinal adenopathy 11/13/2015  . Acute on chronic diastolic heart failure ([place]) 11/13/2015  . Abnormal CT scan, chest 11/11/2015  .  Chest pain 11/11/2015  . Essential hypertension 03/07/2015  . Depression (emotion) 03/07/2015  . Noninfectious gastroenteritis and colitis 01/02/2015  . Sinusitis, chronic 01/02/2015  . Midline low back pain without sciatica 09/10/2014  . Bipolar 1 disorder, mixed, moderate ([place]) 07/02/2014  . Stress incontinence 07/02/2014  . Mania ([place]) 12/10/2013  . Speech abnormality 12/08/2013  . SVT (supraventricular tachycardia) ([place]) 12/06/2013  . Benign essential HTN 11/28/2013  . HTN (hypertension) 11/28/2013  . Type 2 diabetes mellitus ([place]) 11/28/2013  . Pulmonary HTN, moderate to severe 11/03/2013  . Acute on chronic diastolic congestive heart failure ([place]) 11/02/2013  . Hypertensive heart disease 10/18/2013  . Chronic diastolic heart failure ([place]) 07/23/2011  . OSA (obstructive sleep apnea)- non compliant with C-pap 03/06/2011  . Morbid obesity ([place]) 02/19/2011  . Non-ST elevation (NSTEMI) myocardial infarction ([place]) 04/29/2009  . Bipolar disorder     Past Surgical History:  Procedure Laterality Date  . CARDIOVERSION N/A 04/05/2018   Procedure: CARDIOVERSION;  Surgeon: Lelon Perla, MD;  Location[facility] ENDOSCOPY;  Service: Cardiovascular;  Laterality: N/A;  . COLONOSCOPY WITH PROPOFOL Left 07/16/2018   Procedure: COLONOSCOPY WITH PROPOFOL;  Surgeon: Ronnette Juniper, MD;  Location: [facility] ENDOSCOPY;  Service: Gastroenterology;  Laterality: Left;  . LEFT HEART CATH AND CORONARY ANGIOGRAPHY N/A 08/04/2018   Procedure: LEFT HEART CATH AND CORONARY ANGIOGRAPHY;  Surgeon: Belva Crome, MD;  Location: [facility] CV LAB;  Service: Cardiovascular;  Laterality: N/A;  . None    . POLYPECTOMY  07/16/2018   Procedure: POLYPECTOMY;  Surgeon: Ronnette Juniper, MD;  Location: [facility] END[facility];  Service: Gastroenterology;;     OB History   No obstetric history on file.     Family History  Problem Relation Age of Onset  . Heart failure Father   . Stroke Father   . Hypertension Mother   . Heart disease  Maternal Grandfather     Social History   Tobacco Use  . Smoking status: Never Smoker  . Smokeless tobacco: Never Used  Vaping Use  . Vaping Use: Never used  Substance Use Topics  . Alcohol use: No  . Drug use: No    Home Medications Prior to Admission medications   Medication Sig Start Date End Date Taking? Authorizing Provider  Accu-Chek Softclix Lancets lancets USE AS DIRECTED TO TEST BLOOD SUGAR ONCE DAILY 03/18/20   [name], NP  albuterol [facility] HFA) 108 (90 Base) MCG/ACT inhaler INHALE 1 TO 2 PUFFS EVERY 6 HOURS AS NEEDED FOR WHEEZING/ SHORTNESS OF BREATH 11/07/19   [name], NP  amLODipine (NORVASC) 10 MG tablet Take 1 tablet (10 mg total) by mouth daily. 07/02/20   [name], NP  atorvastatin (LIPITOR) 40 MG tablet Take 1 tablet (40 mg total) by mouth daily at 6 PM. 07/02/20   [name], NP  Blood Glucose Monitoring Suppl (ACCU-CHEK AVIVA PLUS) w/Device KIT 1 each by Does not apply route daily. USE AS DIRECTED TO TEST BLOOD SUGAR ONCE DAILY 08/23/18   Raul Del,  Vernia Buff, NP  Blood Glucose Monitoring Suppl (TRUE METRIX METER) w/Device KIT Use as instructed. Check blood glucose levels by fingerstick twice per day 08/16/18   [name], NP  Blood Pressure Monitor DEVI Please provide patient with insurance approved blood pressure monitor I10.0 07/21/20   [name], NP  carvedilol (COREG) 25 MG tablet Take 1 tablet (25 mg total) by mouth 2 (two) times daily with a meal. 07/02/20 09/30/20  [name], NP  escitalopram (LEXAPRO) 10 MG tablet Take 1 tablet (10 mg total) by mouth daily. 07/02/20 09/30/20  [name], NP  ferrous sulfate (FEROSUL) 325 (65 FE) MG tablet Take 1 tablet (325 mg total) by mouth 2 (two) times daily. Patient not taking: Reported on 08/07/2020 07/02/20 09/30/20  [name], NP  glipiZIDE (GLUCOTROL XL) 10 MG 24 hr tablet Take 1 tablet (10 mg total) by mouth every evening. 07/02/20 09/30/20  [name], NP  glucose  blood (ACCU-CHEK AVIVA PLUS) test strip USE AS DIRECTED TO TEST BLOOD SUGAR ONCE DAILY 03/18/20   [name], NP  Incontinence Supply Disposable (INCONTINENCE BRIEF LARGE) MISC Please provide patient with insurance approved incontinence supplies/briefs 07/02/20   [name], NP  losartan (COZAAR) 25 MG tablet Take 0.5 tablets (12.5 mg total) by mouth daily. 07/09/20 10/07/20  [name], NP  Misc. Devices MISC Please provide BiPAP machine with the following: Settings 22/18 cm H2O. Medium  size [employer] Full Face Mask Simplus mask and heated  humidification. 02/28/20   [name], NP  Misc. Devices MISC Please provide patient a Blood Pressure Monitor w/ insurance approval. 07/18/20   [name], NP  oxybutynin (DITROPAN) 5 MG tablet Take 5 mg by mouth 3 (three) times daily.    [provider]  Potassium Chloride ER 20 MEQ TBCR Take 20 mEq by mouth 2 (two) times daily. 07/02/20   [name], NP  risperidone (RISPERDAL) 4 MG tablet Take 1 tablet (4 mg total) by mouth daily. 03/18/20   [name], NP  rivaroxaban (XARELTO) 20 MG TABS tablet TAKE 1 TABLET (20 MG TOTAL) BY MOUTH DAILY WITH SUPPER. 03/18/20   [name], NP  torsemide (DEMADEX) 20 MG tablet TAKE 4 TABLETS (80 MG TOTAL) BY MOUTH 2 (TWO) TIMES DAILY. 01/11/20   Deberah Pelton, NP  Vitamin D, Ergocalciferol, (DRISDOL) 1.25 MG (50000 UNIT) CAPS capsule Take 1 capsule (50,000 Units total) by mouth every 7 (seven) days. 03/19/20   [name], NP  furosemide (LASIX) 80 MG tablet Take 1 tablet (80 mg total) by mouth in the morning, at noon, and at bedtime. 12/19/19 12/30/19  Allie Bossier, MD    Allergies    Acetaminophen, Caffeine, Farxiga [dapagliflozin], and Lisinopril  Review of Systems   Review of Systems  Constitutional: Positive for fatigue. Negative for fever.  HENT: Negative for sore throat.   Eyes: Negative for visual disturbance.  Respiratory: Positive for shortness of  breath. Negative for cough.   Cardiovascular: Positive for chest pain and leg swelling.  Gastrointestinal: Negative for abdominal pain, nausea and vomiting.  Genitourinary: Negative for dysuria.  Musculoskeletal: Negative for neck pain.  Skin: Negative for rash.  Neurological: Negative for headaches.    Physical Exam Updated Vital Signs BP (!) 145/99 (BP Location: Right Arm)   Pulse (!) 102   Temp 98.1 F (36.7 C) (Oral)   Resp 16   SpO2 94%   Physical  Exam Vitals and nursing note reviewed.  Constitutional:      General: She is not in acute distress.    Appearance: She is well-developed.  HENT:     Head: Normocephalic and atraumatic.  Eyes:     Conjunctiva/sclera: Conjunctivae normal.  Cardiovascular:     Rate and Rhythm: Normal rate and regular rhythm.     Heart sounds: No murmur heard.   Pulmonary:     Effort: Pulmonary effort is normal. No respiratory distress.     Breath sounds: Normal breath sounds.  Abdominal:     Palpations: Abdomen is soft.     Tenderness: There is no abdominal tenderness. There is no guarding.  Musculoskeletal:     Cervical back: Neck supple.     Right lower leg: No tenderness. Edema present.     Left lower leg: No tenderness. Edema present.  Skin:    General: Skin is warm and dry.     Capillary Refill: Capillary refill takes less than 2 seconds.  Neurological:     General: No focal deficit present.     Mental Status: She is alert.     ED Results / Procedures / Treatments   Labs (all labs ordered are listed, but only abnormal results are displayed) Labs Reviewed  BASIC METABOLIC PANEL - Abnormal; Notable for the following components:      Result Value   Glucose, Bld 305 (*)    BUN 25 (*)    Creatinine, Ser 1.08 (*)    All other components within normal limits  CBC - Abnormal; Notable for the following components:   Hemoglobin 10.9 (*)    MCH 25.5 (*)    MCHC 29.6 (*)    RDW 15.9 (*)    All other components within normal limits   BRAIN NATRIURETIC PEPTIDE - Abnormal; Notable for the following components:   B Natriuretic Peptide 259.4 (*)    All other components within normal limits  I-STAT VENOUS BLOOD GAS, ED - Abnormal; Notable for the following components:   pH, Ven 7.476 (*)    pCO2, Ven 40.3 (*)    pO2, Ven 56.0 (*)    Bicarbonate 29.7 (*)    Acid-Base Excess 6.0 (*)    Calcium, Ion 1.02 (*)    All other components within normal limits  TROPONIN I (HIGH SENSITIVITY)    EKG EKG Interpretation  Date/Time:  Wednesday August 07 2020 11:33:10 EST Ventricular Rate:  101 PR Interval:    QRS Duration: 82 QT Interval:  372 QTC Calculation: 482 R Axis:   119 Text Interpretation: Atrial fibrillation with rapid ventricular response Left posterior fascicular block Anterior infarct , age undetermined Abnormal ECG No significant change since prior 4/21 Confirmed by Aletta Edouard [phone]) on 08/07/2020 11:48:54 AM   Radiology DG Chest 2 View  Result Date: 08/07/2020 CLINICAL DATA:  Shortness of breath. EXAM: CHEST - 2 VIEW COMPARISON:  Chest x-ray 12/30/2019 FINDINGS: The heart is mildly enlarged but appears stable. Moderate central vascular congestion and mild interstitial edema. No pleural effusions or infiltrates. The bony thorax is intact. IMPRESSION: Findings consistent with CHF. Electronically Signed   By: Marijo Sanes M.D.   On: 08/07/2020 12:06    Procedures Procedures (including critical care time)  Medications Ordered in ED Medications  furosemide (LASIX) injection 80 mg (80 mg Intravenous Given 08/07/20 1329)    ED Course  I have reviewed the triage vital signs and the nursing notes.  Pertinent labs & imaging results that were available  during my care of the patient were reviewed by me and considered in my medical decision making (see chart for details).  Clinical Course as of Aug 07 1824  Wed Aug 07, 2020  1216 Chest x-ray interpreted by me as cardiomegaly and CHF.   [MB]  6213 Gave  patient 80 of Lasix she is on 80 of Demadex twice daily.  Called Ms. Beryl Meager from [facility] in hospital at home and she is going to evaluate if she be a candidate for heart failure at home.   [MB]  0865 Patient sleepy but arousable.  She is requiring oxygen when she falls asleep.  She said she was evaluated for sleep apnea was supposed to get a machine but they never gave her 1.  She is comfortable plan for heart failure at home.  She states she needs to go to the bathroom now so we will see how much she diuresis.   [MB]  7846 I feel the patient is appropriate to go home but feel she would benefit from increased services at home.  Heart failure at home will continue to care for her and I recommend 80 mg dosing this evening and to continue until clinical improvement.   [MB]    Clinical Course User Index [MB] Hayden Rasmussen, MD   MDM Rules/Calculators/A&P                         This patient complains of shortness of breath chest tightness fatigue sleepiness; this involves an extensive number of treatment Options and is a complaint that carries with it a high risk of complications and Morbidity. The differential includes CHF, COPD, hypercapnia, ACS, anemia, metabolic derangement  I ordered, reviewed and interpreted labs, which included CBC which shows a normal white count, stable hemoglobin, chemistries with chronically elevated glucose and creatinine, troponin unremarkable, BNP elevated, VBG with mild alkalosis and no evidence of hypercapnia I ordered medication IV Lasix I ordered imaging studies which included chest x-ray and I independently    visualized and interpreted imaging which showed CHF and cardiomegaly Additional history obtained from patient's mother Previous records obtained and reviewed in epic including [facility] visit today I consulted [facility] hospital at home and discussed lab and imaging findings  Critical Interventions: None  After the interventions stated above, I  reevaluated the patient and found patient to be drowsy but wakes easily and satting well when awake.  Her pulse ox does drift down when she is sleeping she probably has some element of obstructive sleep apnea.  Not requiring oxygen that would require hospitalization.  She is agreeable to plan for continued medication management at home.  Return instructions discussed.  Final Clinical Impression(s) / ED Diagnoses Final diagnoses:  Nonspecific chest pain  Acute on chronic combined systolic and diastolic CHF (congestive heart failure) (HCC)  OSA (obstructive sleep apnea)    Rx / DC Orders ED Discharge Orders    None       Hayden Rasmussen, MD 08/07/20 [place]

## 2020-08-07 NOTE — Telephone Encounter (Signed)
[name] is calling from [facility] stating the pt is currently in their office and they have advised her to go to the ED due to her current symptoms. The patient has requested an appointment with Dr. [name] instead of going to the hsopital due to it being heart related symptoms she is currently having. The patient is having Afib based on her EKG, SOB, CP when taking a deep breathe, and fatigue. I spoke with Hilda Blades in regards to this due to Dr. [name] having a 4:20 Pm available & she stated she does not advise the patient waiting until 4:20 Pm to be seen while having active CP, but it is the calling offices call on what they advise. After telling [name] this she requested the appointment be scheduled and stated she will inform the patient to go to the hospital if her CP worsens prior to her appointment.

## 2020-08-07 NOTE — Telephone Encounter (Signed)
Copied from [place] [id number]. Topic: General - Other >> Aug 07, 2020 11:22 AM Leward Quan A wrote: Reason for CRM: Malachy Mood with Dr [name] office called in and wanted to speak to Geryl Rankins wanted to advise patient that if she is having active chest pain please go to the ER. But otherwise patient is scheduled to see DR [name] at 4.30 PM. Please advise

## 2020-08-07 NOTE — Telephone Encounter (Signed)
Spoke to [employer].Advised if patient having active chest pain she will need to go to [facility] ED.

## 2020-08-07 NOTE — ED Notes (Signed)
Pt in xray

## 2020-08-07 NOTE — ED Triage Notes (Signed)
Pt here for eval of central chest pain with shortness of breath x several days. Just seen at [facility] and sent here for further eval.

## 2020-08-07 NOTE — Patient Instructions (Signed)
Medication Instructions:   STOP LOSARTAN  INCREASE TORSEMIDE TO 80 MG TWICE DAILY= 4 OF THE 20 MG TABLETS TWICE DAILY  *If you need a refill on your cardiac medications before your next appointment, please call your pharmacy*   Lab Work:  Your physician recommends that you return for lab work in: [place]  If you have labs (blood work) drawn today and your tests are completely normal, you will receive your results only by: Marland Kitchen MyChart Message (if you have MyChart) OR . A paper copy in the mail If you have any lab test that is abnormal or we need to change your treatment, we will call you to review the results.   Follow-Up: At [facility], you and your health needs are our priority.  As part of our continuing mission to provide you with exceptional heart care, we have created designated Provider Care Teams.  These Care Teams include your primary Cardiologist (physician) and Advanced Practice Providers (APPs -  Physician Assistants and Nurse Practitioners) who all work together to provide you with the care you need, when you need it.  We recommend signing up for the patient portal called "MyChart".  Sign up information is provided on this After Visit Summary.  MyChart is used to connect with patients for Virtual Visits (Telemedicine).  Patients are able to view lab/test results, encounter notes, upcoming appointments, etc.  Non-urgent messages can be sent to your provider as well.   To learn more about what you can do with MyChart, go to [email or web address].    Your next appointment:   6 week(s)  The format for your next appointment:   In Person  Provider:   You will see one of the following Advanced Practice Providers on your designated Care Team:    Kerin Ransom, PA-C  [place], [place]  Coletta Memos, [place]  Then, [name], MD will plan to see you again in 3 month(s).

## 2020-08-07 NOTE — Progress Notes (Signed)
HPI: Follow-up chest pain,chronicdiastolic congestive heart failure,atrial fibrillation,obesity and hypertension. Also with history of SVTand atrial fibrillation. Admitted with recurrent chest pain November 2019. Enzymes positive.Follow-up limited study November 2019 showed normal LV function and biatrial enlargement. CTA showed no pulmonary embolus. 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 Ejection fraction 65%. Left ventricular end-diastolic pressure 26 mmHg. Procedure complicated by ventricular fibrillation requiring defibrillation. This also resulted in reestablishing sinus rhythm as patient had been in atrial fibrillation previously. Developed contrast nephropathy following procedure.  Last echocardiogram March 2021 showed vigorous LV function, moderately elevated pulmonary pressures, mild biatrial enlargement, mild mitral regurgitation. Patient seen in the emergency room today with complaints of chest pain. Chest x-ray showed CHF.  BNP 259.  Troponin 4 and hemoglobin 10.9.  Patient apparently was not taking her Demadex at prescribed dose.  Since last seen,patient states that for the past 4 days she has had worsening dyspnea on exertion.  She denies orthopnea, PND, pedal edema, chest pain or syncope.  Occasional palpitations.  No bleeding.  Current Outpatient Medications  Medication Sig Dispense Refill   Accu-Chek Softclix Lancets lancets USE AS DIRECTED TO TEST BLOOD SUGAR ONCE DAILY 100 each 12   albuterol (PROAIR HFA) 108 (90 Base) MCG/ACT inhaler INHALE 1 TO 2 PUFFS EVERY 6 HOURS AS NEEDED FOR WHEEZING/ SHORTNESS OF BREATH 8.5 g 2   amLODipine (NORVASC) 10 MG tablet Take 1 tablet (10 mg total) by mouth daily. 90 tablet 1   atorvastatin (LIPITOR) 40 MG tablet Take 1 tablet (40 mg total) by mouth daily at 6 PM. 90 tablet 2   Blood Glucose  Monitoring Suppl (ACCU-CHEK AVIVA PLUS) w/Device KIT 1 each by Does not apply route daily. USE AS DIRECTED TO TEST BLOOD SUGAR ONCE DAILY 1 kit 0   Blood Glucose Monitoring Suppl (TRUE METRIX METER) w/Device KIT Use as instructed. Check blood glucose levels by fingerstick twice per day 1 kit 0   Blood Pressure Monitor DEVI Please provide patient with insurance approved blood pressure monitor I10.0 1 each 0   carvedilol (COREG) 25 MG tablet Take 1 tablet (25 mg total) by mouth 2 (two) times daily with a meal. 180 tablet 1   escitalopram (LEXAPRO) 10 MG tablet Take 1 tablet (10 mg total) by mouth daily. 90 tablet 1   ferrous sulfate (FEROSUL) 325 (65 FE) MG tablet Take 1 tablet (325 mg total) by mouth 2 (two) times daily. (Patient not taking: Reported on 08/07/2020) 180 tablet 1   glipiZIDE (GLUCOTROL XL) 10 MG 24 hr tablet Take 1 tablet (10 mg total) by mouth every evening. 90 tablet 1   glucose blood (ACCU-CHEK AVIVA PLUS) test strip USE AS DIRECTED TO TEST BLOOD SUGAR ONCE DAILY 100 each 12   Incontinence Supply Disposable (INCONTINENCE BRIEF LARGE) MISC Please provide patient with insurance approved incontinence supplies/briefs 18 each 6   losartan (COZAAR) 25 MG tablet Take 0.5 tablets (12.5 mg total) by mouth daily. 45 tablet 0   Misc. Devices MISC Please provide BiPAP machine with the following: Settings 22/18 cm H2O. Medium  size [employer] Full Face Mask Simplus mask and heated  humidification. 1 each 0   Misc. Devices MISC Please provide patient a Blood Pressure Monitor w/ insurance approval. 1 Device 0   oxybutynin (DITROPAN) 5 MG tablet Take 5 mg by mouth 3 (three) times daily.  Potassium Chloride ER 20 MEQ TBCR Take 20 mEq by mouth 2 (two) times daily. 60 tablet 11  °• risperidone (RISPERDAL) 4 MG tablet Take 1 tablet (4 mg total) by mouth daily. 90 tablet 1  °• rivaroxaban (XARELTO) 20 MG TABS tablet TAKE 1 TABLET (20 MG TOTAL) BY MOUTH DAILY WITH SUPPER. 90 tablet 1    °• torsemide (DEMADEX) 20 MG tablet TAKE 4 TABLETS (80 MG TOTAL) BY MOUTH 2 (TWO) TIMES DAILY. 240 tablet 11  °• Vitamin D, Ergocalciferol, (DRISDOL) 1.25 MG (50000 UNIT) CAPS capsule Take 1 capsule (50,000 Units total) by mouth every 7 (seven) days. 12 capsule 1  ° °No current facility-administered medications for this visit.  ° ° ° °Past Medical History:  °Diagnosis Date  °• Acute on chronic diastolic congestive heart failure (HCC) 11/02/2013  ° 10/03/2015, 11/13/2015, 08/03/2017  °• Benign essential HTN 11/28/2013  °• Bipolar disease, chronic (HCC)   °• Chest pain   ° a. 2012 Myoview: EF 63%, no isch/infarct;  b. 04/2016 Lexiscan MV: EF 73%, no ischemia/infarct-->Low risk.  °• Chronic diastolic CHF (congestive heart failure) (HCC) 07/23/2011  ° a. 2015 Echo: EF 55-60%, Gr2 DD;  b. 09/2015 Echo: EF 60-65%, no rwma, mod dil LA, PASP 51mmHg.  °• Cor pulmonale (chronic) (HCC)   °• History of thyrotoxicosis   °• HTN (hypertension) 11/28/2013  °• Hypertensive heart disease 10/18/2013  °• Hypoglycemia   °• Insulin dependent type 2 diabetes mellitus, uncontrolled (HCC)   °• Mediastinal adenopathy   °• Morbid obesity due to excess calories (HCC) 02/19/2011  °• Morbid obesity with BMI of 50.0-59.9, adult (HCC)   °• OSA (obstructive sleep apnea) 03/06/2011  °• Persistent atrial fibrillation (HCC) 12/09/2017  °• Pulmonary HTN, moderate to severe 11/03/2013  °• Sinusitis, chronic 01/02/2015  °• SVT (supraventricular tachycardia) (HCC) 12/06/2013  °• Uncontrolled type 2 diabetes mellitus with hyperglycemia (HCC)   ° ° °Past Surgical History:  °Procedure Laterality Date  °• CARDIOVERSION N/A 04/05/2018  ° Procedure: CARDIOVERSION;  Surgeon: ,  S, MD;  [facility]: [facility];  Service: Cardiovascular;  Laterality: N/A;  °• COLONOSCOPY WITH PROPOFOL Left 07/16/2018  ° Procedure: COLONOSCOPY WITH PROPOFOL;  Surgeon: Karki, Arya, MD;  Location: [facility];  Service: Gastroenterology;  Laterality: Left;  °• LEFT HEART CATH AND  CORONARY ANGIOGRAPHY N/A 08/04/2018  ° Procedure: LEFT HEART CATH AND CORONARY ANGIOGRAPHY;  Surgeon: Smith, Henry W, MD;  [facility];  Service: Cardiovascular;  Laterality: N/A;  °• None    °• POLYPECTOMY  07/16/2018  ° Procedure: POLYPECTOMY;  Surgeon: Karki, Arya, MD;  Location: [facility];  Service: Gastroenterology;;  ° ° °Social History  ° °Socioeconomic History  °• Marital status: Single  °  Spouse name: Not on file  °• Number of children: 0  °• Years of education: 18  °• Highest education level: Not on file  °Occupational History  °• Occupation: unemployed  °Tobacco Use  °• Smoking status: Never Smoker  °• Smokeless tobacco: Never Used  °Vaping Use  °• Vaping Use: Never used  °Substance and Sexual Activity  °• Alcohol use: No  °• Drug use: No  °• Sexual activity: Not Currently  °  Birth control/protection: None  °Other Topics Concern  °• Not on file  °Social History Narrative  ° 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 But ran out of money and is no longer attending school. (  Note patient has bipolar disorder).  °   ° Born in [place] but lived in [place] before coming back to [place].   °   ° Primary language is Arabic. Lives with mother and brother.  ° °Social Determinants of Health  ° °Financial Resource Strain:   °• Difficulty of Paying Living Expenses: Not on file  °Food Insecurity:   °• Worried About Running Out of Food in the Last Year: Not on file  °• Ran Out of Food in the Last Year: Not on file  °Transportation Needs:   °• Lack of Transportation (Medical): Not on file  °• Lack of Transportation (Non-Medical): Not on file  °Physical Activity:   °• Days of Exercise per Week: Not on file  °• Minutes of Exercise per Session: Not on file  °Stress:   °• Feeling of Stress : Not on file  °Social Connections:   °• Frequency of Communication with Friends and Family: Not on file  °• Frequency of Social Gatherings with Friends  and Family: Not on file  °• Attends Religious Services: Not on file  °• Active Member of Clubs or Organizations: Not on file  °• Attends Club or Organization Meetings: Not on file  °• Marital Status: Not on file  °Intimate Partner Violence:   °• Fear of Current or Ex-Partner: Not on file  °• Emotionally Abused: Not on file  °• Physically Abused: Not on file  °• Sexually Abused: Not on file  ° ° °Family History  °Problem Relation Age of Onset  °• Heart failure Father   °• Stroke Father   °• Hypertension Mother   °• Heart disease Maternal Grandfather   ° ° °ROS: no fevers or chills, productive cough, hemoptysis, dysphasia, odynophagia, melena, hematochezia, dysuria, hematuria, rash, seizure activity, orthopnea, PND, pedal edema, claudication. Remaining systems are negative. ° °Physical Exam: °Well-developed obese in no acute distress.  °Skin is warm and dry.  °HEENT is normal.  °Neck is supple.  °Chest is clear to auscultation with normal expansion.  °Cardiovascular exam is irregular and tachycardic °Abdominal exam nontender or distended. No masses palpated. °Extremities show no edema. °neuro grossly intact ° °ECG-atrial fibrillation at a rate of 107-nonspecific ST changes.  Personally reviewed ° °A/P ° °1 persistent atrial fibrillation-patient remains in atrial fibrillation today.  Continue Xarelto.  Continue carvedilol.  We will follow heart rate and advance medications as needed. ° °2 acute on chronic diastolic congestive heart failure-she has not been taking her Demadex at full dose.  We will resume Demadex at 80 mg twice daily which was her previous dose.  Check potassium and renal function in 1 week.  We discussed fluid restriction and low-sodium diet. ° °3 hypertension-blood pressure controlled.  Given increased dose of Demadex will discontinue losartan. 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 ° °5 chronic chest pain-no recent symptoms.  Electrocardiogram shows no diagnostic ST changes.  Continue medical therapy. ° °6 morbid obesity-we discussed the importance of diet, exercise and weight loss. ° ° , MD ° ° ° °

## 2020-08-07 NOTE — Discharge Instructions (Addendum)
You were seen in the emergency department for evaluation of increased shortness of breath and some chest discomfort.  There was no evidence of heart attack.  You do show evidence of congestive heart failure when were given some medicine to help take off the extra fluid.  We are setting you up with heart failure team to help care for you at home which we will continue to administer dose of diuretic to help you with the fluid.  Please follow-up with your doctor.  Return to the emergency department for any worsening or concerning symptoms.  You should also discuss with your doctor possible sleep apnea and the need for oxygen or CPAP at night.

## 2020-08-07 NOTE — Telephone Encounter (Signed)
Patient went to the Emergency department to get evaluated.

## 2020-08-07 NOTE — Progress Notes (Signed)
Assessment & Plan:  Daily was seen today for shortness of breath.  Diagnoses and all orders for this visit:  Shortness of breath at rest Atrial fibrillation, unspecified type ([place]) GO TO THE EMERGENCY ROOM ASAP   Uncontrolled type 2 diabetes mellitus with hyperglycemia (HCC) -     Glucose (CBG)    Patient has been counseled on age-appropriate routine health concerns for screening and prevention. These are reviewed and up-to-date. Referrals have been placed accordingly. Immunizations are up-to-date or declined.    Subjective:   Chief Complaint  Patient presents with  . Shortness of Breath    Pt. stated she is experience shortness of breath when she is sitting and walking.    HPI [name] [age] y.o. female presents to office today with complaints of intermittent chest pain and shortness of breath at rest. She has a significant cardiac history including: CHF, PAF, previous cardioversion, HTN, NSTEMI, SVT, DM 2, ILD and OSA with non compliance.     She has been also experiencing extreme fatigue and drowsiness. Chest pain is worse with taking deep breaths. She has not been taking her torsemide as instructed. States 80 mg BID was too much and has only been taking 40 mg BID. Had Pulm edema on last xray several months ago. She has been sleeping in a chair due to PND. EKG today showing Afib with RVR. She endorses medication compliance with carvedilol 25 mg BID.  I instructed Ms.  Dymond to go to the emergency room. Her mother who is accompanying her would like for me to Call Dr. Jacalyn Lefevre office to see if he can see her today. I adamantly recommended she go to the ED instead however I did call Dr. Jacalyn Lefevre office. She was scheduled for 4:20.He asked that I  Advise the patient  if chest pain is persistent she should NOT WAIT until 4:20 and instead should be seen urgently in the emergency room. When I came back into the exam room I instructed her regarding Dr. Jacalyn Lefevre advising. I also  stated that I had called the emergency room to let them know of her current medical condition and that she would be coming for evaluation. I told Tnia that medically I did not feel she should be waiting to see Dr. [name] 5 hours from now and instead should go over to the emergency room now. She is reluctantly agreeable. States she is afraid she will have to wait a long time. I assured her that I had called over and they were aware she was coming.    Review of Systems  Constitutional: Positive for malaise/fatigue. Negative for fever and weight loss.  HENT: Negative.  Negative for nosebleeds.   Eyes: Negative.  Negative for blurred vision, double vision and photophobia.  Respiratory: Positive for shortness of breath. Negative for cough and sputum production.   Cardiovascular: Positive for chest pain, palpitations, orthopnea and PND. Negative for claudication and leg swelling.  Gastrointestinal: Negative.  Negative for heartburn, nausea and vomiting.  Musculoskeletal: Negative.  Negative for myalgias.  Neurological: Negative.  Negative for dizziness, focal weakness, seizures and headaches.  Psychiatric/Behavioral: Negative.  Negative for suicidal ideas.    Past Medical History:  Diagnosis Date  . Acute on chronic diastolic congestive heart failure ([place]) 11/02/2013   10/03/2015, 11/13/2015, 08/03/2017  . Benign essential HTN 11/28/2013  . Bipolar disease, chronic ([place])   . Chest pain    a. 2012 Myoview: EF 63%, no isch/infarct;  b. 04/2016 Lexiscan MV: EF 73%, no  ischemia/infarct-->Low risk.  . Chronic diastolic CHF (congestive heart failure) ([place]) 07/23/2011   a. 2015 Echo: EF 55-60%, Gr2 DD;  b. 09/2015 Echo: EF 60-65%, no rwma, mod dil LA, PASP 54mHg.  . Cor pulmonale (chronic) ([facility]   . History of thyrotoxicosis   . HTN (hypertension) 11/28/2013  . Hypertensive heart disease 10/18/2013  . Hypoglycemia   . Insulin dependent type 2 diabetes mellitus, uncontrolled ([facility]   . Mediastinal  adenopathy   . Morbid obesity due to excess calories ([facility] 02/19/2011  . Morbid obesity with BMI of 50.0-59.9, adult ([facility]   . OSA (obstructive sleep apnea) 03/06/2011  . Persistent atrial fibrillation ([facility] 12/09/2017  . Pulmonary HTN, moderate to severe 11/03/2013  . Sinusitis, chronic 01/02/2015  . SVT (supraventricular tachycardia) ([facility] 12/06/2013  . Uncontrolled type 2 diabetes mellitus with hyperglycemia ([facility]     Past Surgical History:  Procedure Laterality Date  . CARDIOVERSION N/A 04/05/2018   Procedure: CARDIOVERSION;  Surgeon: CLelon Perla MD;  [facility];  Service: Cardiovascular;  Laterality: N/A;  . COLONOSCOPY WITH PROPOFOL Left 07/16/2018   Procedure: COLONOSCOPY WITH PROPOFOL;  Surgeon: KRonnette Juniper MD;  [facility];  Service: Gastroenterology;  Laterality: Left;  . LEFT HEART CATH AND CORONARY ANGIOGRAPHY N/A 08/04/2018   Procedure: LEFT HEART CATH AND CORONARY ANGIOGRAPHY;  Surgeon: SBelva Crome MD;  [facility];  Service: Cardiovascular;  Laterality: N/A;  . None    . POLYPECTOMY  07/16/2018   Procedure: POLYPECTOMY;  Surgeon: KRonnette Juniper MD;  [facility];  Service: Gastroenterology;;    Family History  Problem Relation Age of Onset  . Heart failure Father   . Stroke Father   . Hypertension Mother   . Heart disease Maternal Grandfather     Social History Reviewed with no changes to be made today.   Outpatient Medications Prior to Visit  Medication Sig Dispense Refill  . Accu-Chek Softclix Lancets lancets USE AS DIRECTED TO TEST BLOOD SUGAR ONCE DAILY 100 each 12  . albuterol (PROAIR HFA) 108 (90 Base) MCG/ACT inhaler INHALE 1 TO 2 PUFFS EVERY 6 HOURS AS NEEDED FOR WHEEZING/ SHORTNESS OF BREATH 8.5 g 2  . amLODipine (NORVASC) 10 MG tablet Take 1 tablet (10 mg total) by mouth daily. 90 tablet 1  . atorvastatin (LIPITOR) 40 MG tablet Take 1 tablet (40 mg total) by mouth daily at 6 PM. 90 tablet 2  . Blood  Glucose Monitoring Suppl (ACCU-CHEK AVIVA PLUS) w/Device KIT 1 each by Does not apply route daily. USE AS DIRECTED TO TEST BLOOD SUGAR ONCE DAILY 1 kit 0  . Blood Glucose Monitoring Suppl (TRUE METRIX METER) w/Device KIT Use as instructed. Check blood glucose levels by fingerstick twice per day 1 kit 0  . Blood Pressure Monitor DEVI Please provide patient with insurance approved blood pressure monitor I10.0 1 each 0  . carvedilol (COREG) 25 MG tablet Take 1 tablet (25 mg total) by mouth 2 (two) times daily with a meal. 180 tablet 1  . escitalopram (LEXAPRO) 10 MG tablet Take 1 tablet (10 mg total) by mouth daily. 90 tablet 1  . glipiZIDE (GLUCOTROL XL) 10 MG 24 hr tablet Take 1 tablet (10 mg total) by mouth every evening. 90 tablet 1  . glucose blood (ACCU-CHEK AVIVA PLUS) test strip USE AS DIRECTED TO TEST BLOOD SUGAR ONCE DAILY 100 each 12  . Incontinence Supply Disposable (INCONTINENCE BRIEF LARGE) MISC Please provide patient with insurance approved incontinence supplies/briefs 18  each 6  . losartan (COZAAR) 25 MG tablet Take 0.5 tablets (12.5 mg total) by mouth daily. 45 tablet 0  . Misc. Devices MISC Please provide BiPAP machine with the following: Settings 22/18 cm H2O. Medium  size [employer] Full Face Mask Simplus mask and heated  humidification. 1 each 0  . Misc. Devices MISC Please provide patient a Blood Pressure Monitor w/ insurance approval. 1 Device 0  . oxybutynin (DITROPAN) 5 MG tablet Take 5 mg by mouth 3 (three) times daily.    . Potassium Chloride ER 20 MEQ TBCR Take 20 mEq by mouth 2 (two) times daily. 60 tablet 11  . risperidone (RISPERDAL) 4 MG tablet Take 1 tablet (4 mg total) by mouth daily. 90 tablet 1  . rivaroxaban (XARELTO) 20 MG TABS tablet TAKE 1 TABLET (20 MG TOTAL) BY MOUTH DAILY WITH SUPPER. 90 tablet 1  . torsemide (DEMADEX) 20 MG tablet TAKE 4 TABLETS (80 MG TOTAL) BY MOUTH 2 (TWO) TIMES DAILY. 240 tablet 11  . Vitamin D, Ergocalciferol, (DRISDOL) 1.25 MG  (50000 UNIT) CAPS capsule Take 1 capsule (50,000 Units total) by mouth every 7 (seven) days. 12 capsule 1  . ferrous sulfate (FEROSUL) 325 (65 FE) MG tablet Take 1 tablet (325 mg total) by mouth 2 (two) times daily. (Patient not taking: Reported on 08/07/2020) 180 tablet 1  . metFORMIN (GLUCOPHAGE) 1000 MG tablet Take 1 tablet (1,000 mg total) by mouth 2 (two) times daily with a meal. 180 tablet 2   No facility-administered medications prior to visit.    Allergies  Allergen Reactions  . Acetaminophen Other (See Comments)    Seizure-like "fits" as a child  . Caffeine     Tense, anxiety, increased urination  . [place] [Dapagliflozin] Other (See Comments)    Hallucinations, drop in blood sugar  . Lisinopril Rash    Rash with lisinopril; but fosinopril is ok per patient       Objective:    BP 115/80 (BP Location: Left Arm, Patient Position: Sitting, Cuff Size: Large)   Pulse 97   Temp 97.7 F (36.5 C) (Temporal)   Ht _0  (1.702 m)   Wt (!) 336 lb 6.4 oz (152.6 kg)   SpO2 94%   BMI 52.69 kg/m  Wt Readings from Last 3 Encounters:  08/07/20 (!) 336 lb 6.4 oz (152.6 kg)  07/02/20 (!) 332 lb (150.6 kg)  05/21/20 (!) 335 lb 9.6 oz (152.2 kg)    Physical Exam Vitals and nursing note reviewed.  Constitutional:      Appearance: She is well-developed. She is obese.  HENT:     Head: Normocephalic and atraumatic.  Cardiovascular:     Rate and Rhythm: Tachycardia present. Rhythm irregular.     Pulses: Normal pulses.  Pulmonary:     Effort: No tachypnea.     Breath sounds: Decreased air movement present. No decreased breath sounds.  Abdominal:     General: Bowel sounds are normal.     Palpations: Abdomen is soft.  Musculoskeletal:        General: Normal range of motion.     Cervical back: Normal range of motion.  Skin:    General: Skin is warm and dry.  Neurological:     Mental Status: She is alert and oriented to person, place, and time.     Coordination: Coordination  normal.  Psychiatric:        Behavior: Behavior normal. Behavior is cooperative.  Thought Content: Thought content normal.        Judgment: Judgment normal.          Patient has been counseled extensively about nutrition and exercise as well as the importance of adherence with medications and regular follow-up. The patient was given clear instructions to go to ER or return to medical center if symptoms don't improve, worsen or new problems develop. The patient verbalized understanding.   Follow-up: Return in about 4 weeks (around 09/04/2020).   [name], FNP-BC [facility] and [place], [place]   08/07/2020, 12:00 PM

## 2020-08-08 ENCOUNTER — Telehealth: Payer: Self-pay

## 2020-08-08 ENCOUNTER — Other Ambulatory Visit: Payer: Medicaid Other

## 2020-08-08 NOTE — Telephone Encounter (Signed)
Contacted pt at PCP request to inform that she does not need to come in for lab today, appt was cancelled, pt verbalized understanding.

## 2020-08-12 ENCOUNTER — Encounter (HOSPITAL_COMMUNITY): Payer: Self-pay

## 2020-08-12 ENCOUNTER — Emergency Department (HOSPITAL_COMMUNITY): Payer: Medicaid Other

## 2020-08-12 ENCOUNTER — Emergency Department (HOSPITAL_COMMUNITY)
Admission: EM | Admit: 2020-08-12 | Discharge: 2020-08-12 | Disposition: A | Discharge status: Home / Self Care | Payer: Medicaid Other | Attending: Emergency Medicine | Admitting: Emergency Medicine

## 2020-08-12 DIAGNOSIS — I110 Hypertensive heart disease with heart failure: Secondary | ICD-10-CM | POA: Insufficient documentation

## 2020-08-12 DIAGNOSIS — I4891 Unspecified atrial fibrillation: Secondary | ICD-10-CM | POA: Diagnosis not present

## 2020-08-12 DIAGNOSIS — R0602 Shortness of breath: Secondary | ICD-10-CM | POA: Diagnosis not present

## 2020-08-12 DIAGNOSIS — R55 Syncope and collapse: Secondary | ICD-10-CM | POA: Diagnosis not present

## 2020-08-12 DIAGNOSIS — E1165 Type 2 diabetes mellitus with hyperglycemia: Secondary | ICD-10-CM | POA: Diagnosis not present

## 2020-08-12 DIAGNOSIS — R0902 Hypoxemia: Secondary | ICD-10-CM | POA: Diagnosis not present

## 2020-08-12 DIAGNOSIS — Z20822 Contact with and (suspected) exposure to covid-19: Secondary | ICD-10-CM | POA: Diagnosis not present

## 2020-08-12 DIAGNOSIS — Z7901 Long term (current) use of anticoagulants: Secondary | ICD-10-CM | POA: Insufficient documentation

## 2020-08-12 DIAGNOSIS — R42 Dizziness and giddiness: Secondary | ICD-10-CM | POA: Diagnosis not present

## 2020-08-12 DIAGNOSIS — R000 Tachycardia, unspecified: Secondary | ICD-10-CM | POA: Diagnosis not present

## 2020-08-12 DIAGNOSIS — J811 Chronic pulmonary edema: Secondary | ICD-10-CM | POA: Diagnosis not present

## 2020-08-12 DIAGNOSIS — I509 Heart failure, unspecified: Secondary | ICD-10-CM

## 2020-08-12 DIAGNOSIS — Z79899 Other long term (current) drug therapy: Secondary | ICD-10-CM | POA: Diagnosis not present

## 2020-08-12 DIAGNOSIS — I5033 Acute on chronic diastolic (congestive) heart failure: Secondary | ICD-10-CM | POA: Insufficient documentation

## 2020-08-12 DIAGNOSIS — Z7984 Long term (current) use of oral hypoglycemic drugs: Secondary | ICD-10-CM | POA: Insufficient documentation

## 2020-08-12 DIAGNOSIS — I517 Cardiomegaly: Secondary | ICD-10-CM | POA: Diagnosis not present

## 2020-08-12 LAB — CBC WITH DIFFERENTIAL/PLATELET
Abs Immature Granulocytes: 0.04 10*3/uL (ref 0.00–0.07)
Basophils Absolute: 0 10*3/uL (ref 0.0–0.1)
Basophils Relative: 1 %
Eosinophils Absolute: 0.1 10*3/uL (ref 0.0–0.5)
Eosinophils Relative: 2 %
HCT: 37.1 % (ref 36.0–46.0)
Hemoglobin: 11.4 g/dL — ABNORMAL LOW (ref 12.0–15.0)
Immature Granulocytes: 1 %
Lymphocytes Relative: 25 %
Lymphs Abs: 1.3 10*3/uL (ref 0.7–4.0)
MCH: 26.1 pg (ref 26.0–34.0)
MCHC: 30.7 g/dL (ref 30.0–36.0)
MCV: 84.9 fL (ref 80.0–100.0)
Monocytes Absolute: 0.6 10*3/uL (ref 0.1–1.0)
Monocytes Relative: 11 %
Neutro Abs: 3.3 10*3/uL (ref 1.7–7.7)
Neutrophils Relative %: 60 %
Platelets: 258 10*3/uL (ref 150–400)
RBC: 4.37 MIL/uL (ref 3.87–5.11)
RDW: 15.9 % — ABNORMAL HIGH (ref 11.5–15.5)
WBC: 5.5 10*3/uL (ref 4.0–10.5)
nRBC: 0 % (ref 0.0–0.2)

## 2020-08-12 LAB — TROPONIN I (HIGH SENSITIVITY)
Troponin I (High Sensitivity): 4 ng/L (ref <18)
Troponin I (High Sensitivity): 4 ng/L (ref <18)

## 2020-08-12 LAB — COMPREHENSIVE METABOLIC PANEL
ALT: 11 U/L (ref 0–44)
AST: 20 U/L (ref 15–41)
Albumin: 3.4 g/dL — ABNORMAL LOW (ref 3.5–5.0)
Alkaline Phosphatase: 106 U/L (ref 38–126)
Anion gap: 12 (ref 5–15)
BUN: 15 mg/dL (ref 6–20)
CO2: 26 mmol/L (ref 22–32)
Calcium: 9.7 mg/dL (ref 8.9–10.3)
Chloride: 99 mmol/L (ref 98–111)
Creatinine, Ser: 1.02 mg/dL — ABNORMAL HIGH (ref 0.44–1.00)
GFR, Estimated: >60 mL/min (ref >60)
Glucose, Bld: 134 mg/dL — ABNORMAL HIGH (ref 70–99)
Potassium: 3.8 mmol/L (ref 3.5–5.1)
Sodium: 137 mmol/L (ref 135–145)
Total Bilirubin: 0.8 mg/dL (ref 0.3–1.2)
Total Protein: 7.9 g/dL (ref 6.5–8.1)

## 2020-08-12 LAB — RESPIRATORY PANEL BY RT PCR (FLU A&B, COVID)
Influenza A by PCR: NEGATIVE
Influenza B by PCR: NEGATIVE
SARS Coronavirus 2 by RT PCR: NEGATIVE

## 2020-08-12 LAB — BRAIN NATRIURETIC PEPTIDE: B Natriuretic Peptide: 260 pg/mL — ABNORMAL HIGH (ref 0.0–100.0)

## 2020-08-12 NOTE — ED Provider Notes (Signed)
Care of the patient assumed at the change of shift. Initially brought in with concerns for syncope. Patient's mother at bedside reports that it more of a situation where the patient had fallen asleep and she was having trouble waking her up. The patient has been getting Lasix infusions via [facility]. She is sitting up in bed, no distress and would like to go home. Labs reviewed and no concerning findings. CXR consistent with known CHF, but she is not requiring supplemental oxygen and already set up for care at home.    Truddie Hidden, MD 08/12/20 [phone]

## 2020-08-12 NOTE — ED Notes (Signed)
Patient verbalizes understanding of discharge instructions. Opportunity for questioning and answers were provided. Armband removed by staff, pt discharged from ED ambulatory with family member.

## 2020-08-12 NOTE — ED Triage Notes (Signed)
Pt arrived to ED via ems from home. Pt with intermitting episodes of feeling sob recently dx with afib. Pt states increasing sob today and had a syncopal episode at home. Per ems pt in afib with rvr rate 90-130.

## 2020-08-12 NOTE — ED Provider Notes (Signed)
[facility] EMERGENCY DEPARTMENT Provider Note   CSN: [id number] Arrival date & time: 08/12/20  1500     History Chief Complaint  Patient presents with  . Loss of Consciousness    [name] is a 54 y.o. female.  HPI Patient presents with shortness of breath and syncope.  States her nurse was infusing Lasix at home.  States that about an hour after the infusion she was sitting down and passed out.  States she has felt her heart going fast at times.  History of A. fib.  Is on treatment for it.  States she also had some chest pain but this is not unusual for her.  The chest pain was prior to the passing out.  No swelling in her legs.  States she is feeling somewhat better now.  No loss of bladder control.  No headache.    Past Medical History:  Diagnosis Date  . Acute on chronic diastolic congestive heart failure ([place]) 11/02/2013   10/03/2015, 11/13/2015, 08/03/2017  . Benign essential HTN 11/28/2013  . Bipolar disease, chronic ([place])   . Chest pain    a. 2012 Myoview: EF 63%, no isch/infarct;  b. 04/2016 Lexiscan MV: EF 73%, no ischemia/infarct-->Low risk.  . Chronic diastolic CHF (congestive heart failure) ([place]) 07/23/2011   a. 2015 Echo: EF 55-60%, Gr2 DD;  b. 09/2015 Echo: EF 60-65%, no rwma, mod dil LA, PASP 57mHg.  . Cor pulmonale (chronic) ([place]   . History of thyrotoxicosis   . HTN (hypertension) 11/28/2013  . Hypertensive heart disease 10/18/2013  . Hypoglycemia   . Insulin dependent type 2 diabetes mellitus, uncontrolled (HBayou [place]oul[place]   . Mediastinal adenopathy   . Morbid obesity due to excess calories ([place] 02/19/2011  . Morbid obesity with BMI of 50.0-59.9, adult (HG[place]   . OSA (obstructive sleep apnea) 03/06/2011  . Persistent atrial fibrillation ([place] 12/09/2017  . Pulmonary HTN, moderate to severe 11/03/2013  . Sinusitis, chronic 01/02/2015  . SVT (supraventricular tachycardia) (HFlor[place] City 12/06/2013  . Uncontrolled type 2 diabetes mellitus with hyperglycemia ([facility]       Patient Active Problem List   Diagnosis Date Noted  . Obesity hypoventilation syndrome (HTwin [place] 12/15/2019  . Depression 12/15/2019  . Class 3 obesity 12/10/2019  . Acute exacerbation of CHF (congestive heart failure) (HLund 12/09/2019  . ILD (interstitial lung disease) (HFlippin 05/28/2019  . Exertional angina (HNox[place] 05/28/2019  . AKI (acute kidney injury) (HV[place]   . Acute CHF (congestive heart failure) (HN[place] 08/03/2018  . A-fib (HCarl[place] 08/03/2018  . Vitamin B12 deficiency 07/20/2018  . Hematochezia 07/14/2018  . Acute posthemorrhagic anemia 07/14/2018  . GIB (gastrointestinal bleeding) 07/14/2018  . Chronic anticoagulation 07/14/2018  . Persistent atrial fibrillation   . Uncontrolled type 2 diabetes mellitus with hyperglycemia (HBeech [place]   . Hypokalemia   . Cor pulmonale (chronic) (HL[place]   . Mycobacterium avium complex (HE[place] 12/13/2015  . Pyrexia   . Dyspnea 11/13/2015  . Mediastinal adenopathy 11/13/2015  . Acute on chronic diastolic heart failure (HBishop 11/13/2015  . Abnormal CT scan, chest 11/11/2015  . Chest pain 11/11/2015  . Essential hypertension 03/07/2015  . Depression (emotion) 03/07/2015  . Noninfectious gastroenteritis and colitis 01/02/2015  . Sinusitis, chronic 01/02/2015  . Midline low back pain without sciatica 09/10/2014  . Bipolar 1 disorder, mixed, moderate (HRober[place] 07/02/2014  . Stress incontinence 07/02/2014  . Mania (HK[place] 12/10/2013  . Speech abnormality 12/08/2013  . SVT (supraventricular tachycardia) (HBertram 12/06/2013  . Benign essential HTN 11/28/2013  . HTN (  hypertension) 11/28/2013  . Type 2 diabetes mellitus ([place]) 11/28/2013  . Pulmonary HTN, moderate to severe 11/03/2013  . Acute on chronic diastolic congestive heart failure ([place]) 11/02/2013  . Hypertensive heart disease 10/18/2013  . Chronic diastolic heart failure ([place]) 07/23/2011  . OSA (obstructive sleep apnea)- non compliant with C-pap 03/06/2011  . Morbid obesity ([place]) 02/19/2011  . Non-ST elevation  (NSTEMI) myocardial infarction ([place]) 04/29/2009  . Bipolar disorder     Past Surgical History:  Procedure Laterality Date  . CARDIOVERSION N/A 04/05/2018   Procedure: CARDIOVERSION;  Surgeon: Lelon Perla, MD;  Location: [facility] ENDOSCOPY;  Service: Cardiovascular;  Laterality: N/A;  . COLONOSCOPY WITH PROPOFOL Left 07/16/2018   Procedure: COLONOSCOPY WITH PROPOFOL;  Surgeon: Ronnette Juniper, MD;  Location: [facility] ENDOSCOPY;  Service: Gastroenterology;  Laterality: Left;  . LEFT HEART CATH AND CORONARY ANGIOGRAPHY N/A 08/04/2018   Procedure: LEFT HEART CATH AND CORONARY ANGIOGRAPHY;  Surgeon: Belva Crome, MD;  Location: [facility] CV LAB;  Service: Cardiovascular;  Laterality: N/A;  . None    . POLYPECTOMY  07/16/2018   Procedure: POLYPECTOMY;  Surgeon: Ronnette Juniper, MD;  Location: [facility];  Service: Gastroenterology;;     OB History   No obstetric history on file.     Family History  Problem Relation Age of Onset  . Heart failure Father   . Stroke Father   . Hypertension Mother   . Heart disease Maternal Grandfather     Social History   Tobacco Use  . Smoking status: Never Smoker  . Smokeless tobacco: Never Used  Vaping Use  . Vaping Use: Never used  Substance Use Topics  . Alcohol use: No  . Drug use: No    Home Medications Prior to Admission medications   Medication Sig Start Date End Date Taking? Authorizing Provider  Accu-Chek Softclix Lancets lancets USE AS DIRECTED TO TEST BLOOD SUGAR ONCE DAILY 03/18/20   [name], NP  albuterol [facility] HFA) 108 (90 Base) MCG/ACT inhaler INHALE 1 TO 2 PUFFS EVERY 6 HOURS AS NEEDED FOR WHEEZING/ SHORTNESS OF BREATH 11/07/19   [name], NP  amLODipine (NORVASC) 10 MG tablet Take 1 tablet (10 mg total) by mouth daily. 07/02/20   [name], NP  atorvastatin (LIPITOR) 40 MG tablet Take 1 tablet (40 mg total) by mouth daily at 6 PM. 07/02/20   [name], NP  Blood Glucose Monitoring Suppl (ACCU-CHEK AVIVA PLUS)  w/Device KIT 1 each by Does not apply route daily. USE AS DIRECTED TO TEST BLOOD SUGAR ONCE DAILY 08/23/18   [name], NP  Blood Glucose Monitoring Suppl (TRUE METRIX METER) w/Device KIT Use as instructed. Check blood glucose levels by fingerstick twice per day 08/16/18   [name], NP  Blood Pressure Monitor DEVI Please provide patient with insurance approved blood pressure monitor I10.0 07/21/20   [name], NP  carvedilol (COREG) 25 MG tablet Take 1 tablet (25 mg total) by mouth 2 (two) times daily with a meal. 07/02/20 09/30/20  [name], NP  escitalopram (LEXAPRO) 10 MG tablet Take 1 tablet (10 mg total) by mouth daily. 07/02/20 09/30/20  [name], NP  ferrous sulfate (FEROSUL) 325 (65 FE) MG tablet Take 1 tablet (325 mg total) by mouth 2 (two) times daily. 07/02/20 09/30/20  [name], NP  glipiZIDE (GLUCOTROL XL) 10 MG 24 hr tablet Take 1 tablet (10 mg total) by mouth every evening. 07/02/20 09/30/20  Geryl Rankins  W, NP  glucose blood (ACCU-CHEK AVIVA PLUS) test strip USE AS DIRECTED TO TEST BLOOD SUGAR ONCE DAILY 03/18/20   [name], NP  Incontinence Supply Disposable (INCONTINENCE BRIEF LARGE) MISC Please provide patient with insurance approved incontinence supplies/briefs 07/02/20   [name], NP  Misc. Devices MISC Please provide BiPAP machine with the following: Settings 22/18 cm H2O. Medium  size [employer] Full Face Mask Simplus mask and heated  humidification. 02/28/20   [name], NP  Misc. Devices MISC Please provide patient a Blood Pressure Monitor w/ insurance approval. 07/18/20   [name], NP  oxybutynin (DITROPAN) 5 MG tablet Take 5 mg by mouth 3 (three) times daily.    [provider]  Potassium Chloride ER 20 MEQ TBCR Take 20 mEq by mouth 2 (two) times daily. 07/02/20   [name], NP  risperidone (RISPERDAL) 4 MG tablet Take 1 tablet (4 mg total) by mouth daily. 03/18/20   [name], NP    rivaroxaban (XARELTO) 20 MG TABS tablet TAKE 1 TABLET (20 MG TOTAL) BY MOUTH DAILY WITH SUPPER. 03/18/20   [name], NP  torsemide (DEMADEX) 20 MG tablet Take 4 tablets (80 mg total) by mouth 2 (two) times daily. 08/07/20   Lelon Perla, MD  Vitamin D, Ergocalciferol, (DRISDOL) 1.25 MG (50000 UNIT) CAPS capsule Take 1 capsule (50,000 Units total) by mouth every 7 (seven) days. 03/19/20   [name], NP  furosemide (LASIX) 80 MG tablet Take 1 tablet (80 mg total) by mouth in the morning, at noon, and at bedtime. 12/19/19 12/30/19  Allie Bossier, MD    Allergies    Acetaminophen, Caffeine, Farxiga [dapagliflozin], and Lisinopril  Review of Systems   Review of Systems  Constitutional: Negative for appetite change.  HENT: Negative for congestion.   Respiratory: Positive for shortness of breath.   Cardiovascular: Positive for palpitations.  Gastrointestinal: Negative for abdominal pain.  Genitourinary: Negative for flank pain.  Musculoskeletal: Negative for arthralgias.  Skin: Negative for rash.  Neurological: Positive for syncope.  Hematological: Negative for adenopathy.  Psychiatric/Behavioral: Negative for confusion.    Physical Exam Updated Vital Signs BP 128/89   Pulse (!) 104   Temp (!) 97.4 F (36.3 C) (Oral)   Resp 19   SpO2 99%   Physical Exam Vitals and nursing note reviewed.  HENT:     Head: Atraumatic.  Eyes:     General: No scleral icterus.    Extraocular Movements: Extraocular movements intact.     Pupils: Pupils are equal, round, and reactive to light.  Cardiovascular:     Rate and Rhythm: Tachycardia present. Rhythm irregular.  Pulmonary:     Breath sounds: No wheezing.  Abdominal:     Tenderness: There is no abdominal tenderness.  Musculoskeletal:     Cervical back: Neck supple.     Comments: Some edema bilateral lower extremities.  Skin:    General: Skin is warm.     Capillary Refill: Capillary refill takes less than 2 seconds.   Neurological:     Mental Status: She is alert and oriented to person, place, and time.  Psychiatric:        Mood and Affect: Mood normal.     ED Results / Procedures / Treatments   Labs (all labs ordered are listed, but only abnormal results are displayed) Labs Reviewed  CBC WITH DIFFERENTIAL/PLATELET  COMPREHENSIVE METABOLIC PANEL  BRAIN NATRIURETIC PEPTIDE  TROPONIN I (HIGH  SENSITIVITY)    EKG EKG Interpretation  Date/Time:  Monday August 12 2020 15:07:51 EST Ventricular Rate:  110 PR Interval:    QRS Duration: 86 QT Interval:  358 QTC Calculation: 485 R Axis:   121 Text Interpretation: Atrial fibrillation Abnormal lateral Q waves Anterior infarct, old Confirmed by Davonna Belling [phone]) on 08/12/2020 3:31:39 PM   Radiology No results found.  Procedures Procedures (including critical care time)  Medications Ordered in ED Medications - No data to display  ED Course  I have reviewed the triage vital signs and the nursing notes.  Pertinent labs & imaging results that were available during my care of the patient were reviewed by me and considered in my medical decision making (see chart for details).    MDM Rules/Calculators/A&P                          Patient presents after syncopal episode.  Reportedly was getting IV Lasix at home.  States the episode happened while she was sitting not when she was trying to stand up.  Have a history of A. fib and is anticoagulated.  Reportedly in A. fib chronically.  Reportedly had rate going faster also.  EKG shows an A. fib with a mild tachycardia at this time.  Will get lab work and x-ray.  Care turned over to oncoming provider, Dr. Karle Starch.  chadsvasc 6 Final Clinical Impression(s) / ED Diagnoses Final diagnoses:  Syncope, unspecified syncope type  Atrial fibrillation with rapid ventricular response [facility])    Rx / DC Orders ED Discharge Orders    None       Davonna Belling, MD 08/12/20 1544

## 2020-08-13 ENCOUNTER — Telehealth: Payer: Self-pay | Admitting: Cardiology

## 2020-08-13 NOTE — Telephone Encounter (Signed)
Lattie Haw, [place] Nurse with [facility] wanted to give Dr. [name] an update on the patient.  The [facility] wanted to make Dr. [name] aware of the extra medication that was administered.   08/08/20: Torsemide 80 mg BID PO, Potassium 08/09/20: Lasix 80 mg IV 08/10/20: Lasix 80 mg IV 08/11/20: Lasix 80 mg IV given 2x  08/12/20: Torsemide 80 mg BID PO  HHN reports that the pt had to go to the hospital yesterday because her O2 saturation was in the 70s and the patient was not able to stay awake for more than 60 seconds. The patient does have sleep apnea but does not use a cpap. She would like Dr. [name] to get her set up to start using one.   The [facility] Nurse said that at rest the 02 saturation is at 97 but after walking 40 ft it drops into the 70s and requires 3 minutes of rest to come back up.    The HHN drew the BMP at home and will also fax the results of those labs to Dr. [name]. The [facility] will come back to see the patiuent tomorrow. There is also in home PT coming to improve her endurance.  She will be faxing all of this information to Dr. [name] as well but was told to provide an oral account.  The number provided is the cell phone for the [facility]

## 2020-08-13 NOTE — Telephone Encounter (Signed)
FU APP ov ASAP; need bmet [name]

## 2020-08-13 NOTE — Telephone Encounter (Signed)
Spoke with lisa, Follow up scheduled

## 2020-08-13 NOTE — Telephone Encounter (Signed)
Attempted to return call to [place], [facility], left a VM. I will forward her message to Dr. [name].

## 2020-08-15 ENCOUNTER — Ambulatory Visit (INDEPENDENT_AMBULATORY_CARE_PROVIDER_SITE_OTHER): Payer: Medicaid Other | Admitting: Cardiology

## 2020-08-15 ENCOUNTER — Other Ambulatory Visit: Payer: Self-pay

## 2020-08-15 ENCOUNTER — Encounter: Payer: Self-pay | Admitting: Cardiology

## 2020-08-15 VITALS — BP 138/78 | HR 90 | Ht 67.0 in | Wt 330.0 lb

## 2020-08-15 DIAGNOSIS — I5033 Acute on chronic diastolic (congestive) heart failure: Secondary | ICD-10-CM

## 2020-08-15 DIAGNOSIS — I10 Essential (primary) hypertension: Secondary | ICD-10-CM

## 2020-08-15 DIAGNOSIS — G8929 Other chronic pain: Secondary | ICD-10-CM

## 2020-08-15 DIAGNOSIS — R079 Chest pain, unspecified: Secondary | ICD-10-CM

## 2020-08-15 DIAGNOSIS — I4819 Other persistent atrial fibrillation: Secondary | ICD-10-CM | POA: Diagnosis not present

## 2020-08-15 NOTE — Patient Instructions (Signed)
  Follow-Up: At [facility], you and your health needs are our priority.  As part of our continuing mission to provide you with exceptional heart care, we have created designated Provider Care Teams.  These Care Teams include your primary Cardiologist (physician) and Advanced Practice Providers (APPs -  Physician Assistants and Nurse Practitioners) who all work together to provide you with the care you need, when you need it.  We recommend signing up for the patient portal called "MyChart".  Sign up information is provided on this After Visit Summary.  MyChart is used to connect with patients for Virtual Visits (Telemedicine).  Patients are able to view lab/test results, encounter notes, upcoming appointments, etc.  Non-urgent messages can be sent to your provider as well.   To learn more about what you can do with MyChart, go to [email or web address].    Your next appointment:   6-8 week(s)  The format for your next appointment:   In Person  Provider:   You will see one of the following Advanced Practice Providers on your designated Care Team:    Kerin Ransom, PA-C  [place], [place]  Coletta Memos, [place]  Then, [name], MD will plan to see you again in 6 month(s).

## 2020-08-15 NOTE — Progress Notes (Signed)
HPI: Follow-up chest pain,chronicdiastolic congestive heart failure,atrial fibrillation,obesity and hypertension. Also with history of SVTand atrial fibrillation. Admitted with recurrent chest pain November 2019. Enzymes positive.Follow-up limited study November 2019 showed normal LV function and biatrial enlargement. CTA showed no pulmonary embolus. 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 Ejection fraction 65%. Left ventricular end-diastolic pressure 26 mmHg. Procedure complicated by ventricular fibrillation requiring defibrillation. This also resulted in reestablishing sinus rhythm as patient had been in atrial fibrillation previously. Developed contrast nephropathy following procedure.Last echocardiogram March 2021 showed vigorous LV function, moderately elevated pulmonary pressures, mild biatrial enlargement, mild mitral regurgitation. Patient seen in the emergency room today with complaints of chest pain. Chest x-ray showed CHF.  BNP 259.  Troponin 4 and hemoglobin 10.9.  Patient apparently was not taking her Demadex at prescribed dose.    Patient seen in the emergency room November 15 with dyspnea and question syncope.  Chest x-ray showed CHF; BNP 260.  It was felt she had fallen asleep.  She has been receiving IV Lasix at home.  Since last seen,she does have some dyspnea on exertion but states it is improved.  No pedal edema.  Occasional brief pain in her chest which is unchanged.  No syncope.  Current Outpatient Medications  Medication Sig Dispense Refill   Accu-Chek Softclix Lancets lancets USE AS DIRECTED TO TEST BLOOD SUGAR ONCE DAILY 100 each 12   albuterol (PROAIR HFA) 108 (90 Base) MCG/ACT inhaler INHALE 1 TO 2 PUFFS EVERY 6 HOURS AS NEEDED FOR WHEEZING/ SHORTNESS OF BREATH 8.5 g 2   amLODipine (NORVASC) 10 MG tablet Take 1 tablet (10  mg total) by mouth daily. 90 tablet 1   atorvastatin (LIPITOR) 40 MG tablet Take 1 tablet (40 mg total) by mouth daily at 6 PM. 90 tablet 2   Blood Glucose Monitoring Suppl (ACCU-CHEK AVIVA PLUS) w/Device KIT 1 each by Does not apply route daily. USE AS DIRECTED TO TEST BLOOD SUGAR ONCE DAILY 1 kit 0   Blood Glucose Monitoring Suppl (TRUE METRIX METER) w/Device KIT Use as instructed. Check blood glucose levels by fingerstick twice per day 1 kit 0   Blood Pressure Monitor DEVI Please provide patient with insurance approved blood pressure monitor I10.0 1 each 0   carvedilol (COREG) 25 MG tablet Take 1 tablet (25 mg total) by mouth 2 (two) times daily with a meal. 180 tablet 1   escitalopram (LEXAPRO) 10 MG tablet Take 1 tablet (10 mg total) by mouth daily. 90 tablet 1   ferrous sulfate (FEROSUL) 325 (65 FE) MG tablet Take 1 tablet (325 mg total) by mouth 2 (two) times daily. 180 tablet 1   glipiZIDE (GLUCOTROL XL) 10 MG 24 hr tablet Take 1 tablet (10 mg total) by mouth every evening. 90 tablet 1   glucose blood (ACCU-CHEK AVIVA PLUS) test strip USE AS DIRECTED TO TEST BLOOD SUGAR ONCE DAILY 100 each 12   Incontinence Supply Disposable (INCONTINENCE BRIEF LARGE) MISC Please provide patient with insurance approved incontinence supplies/briefs 18 each 6   Misc. Devices MISC Please provide BiPAP machine with the following: Settings 22/18 cm H2O. Medium  size [employer] Full Face Mask Simplus mask and heated  humidification. 1 each 0   Misc. Devices MISC Please provide patient a Blood Pressure Monitor w/ insurance approval. 1 Device 0   oxybutynin (DITROPAN) 5 MG tablet Take 5  mg by mouth 3 (three) times daily.     Potassium Chloride ER 20 MEQ TBCR Take 20 mEq by mouth 2 (two) times daily. 60 tablet 11   risperidone (RISPERDAL) 4 MG tablet Take 1 tablet (4 mg total) by mouth daily. 90 tablet 1   rivaroxaban (XARELTO) 20 MG TABS tablet TAKE 1 TABLET (20 MG TOTAL) BY MOUTH DAILY WITH  SUPPER. 90 tablet 1   torsemide (DEMADEX) 20 MG tablet Take 4 tablets (80 mg total) by mouth 2 (two) times daily. 240 tablet 11   Vitamin D, Ergocalciferol, (DRISDOL) 1.25 MG (50000 UNIT) CAPS capsule Take 1 capsule (50,000 Units total) by mouth every 7 (seven) days. 12 capsule 1   No current facility-administered medications for this visit.     Past Medical History:  Diagnosis Date   Acute on chronic diastolic congestive heart failure ([place]) 11/02/2013   10/03/2015, 11/13/2015, 08/03/2017   Benign essential HTN 11/28/2013   Bipolar disease, chronic ([place])    Chest pain    a. 2012 Myoview: EF 63%, no isch/infarct;  b. 04/2016 Lexiscan MV: EF 73%, no ischemia/infarct-->Low risk.   Chronic diastolic CHF (congestive heart failure) ([place]) 07/23/2011   a. 2015 Echo: EF 55-60%, Gr2 DD;  b. 09/2015 Echo: EF 60-65%, no rwma, mod dil LA, PASP 41mHg.   Cor pulmonale (chronic) (HCC)    History of thyrotoxicosis    HTN (hypertension) 11/28/2013   Hypertensive heart disease 10/18/2013   Hypoglycemia    Insulin dependent type 2 diabetes mellitus, uncontrolled ([facility]    Mediastinal adenopathy    Morbid obesity due to excess calories ([place] 02/19/2011   Morbid obesity with BMI of 50.0-59.9, adult (HCC)    OSA (obstructive sleep apnea) 03/06/2011   Persistent atrial fibrillation ([place] 12/09/2017   Pulmonary HTN, moderate to severe 11/03/2013   Sinusitis, chronic 01/02/2015   SVT (supraventricular tachycardia) ([place] 12/06/2013   Uncontrolled type 2 diabetes mellitus with hyperglycemia ([facility]     Past Surgical History:  Procedure Laterality Date   CARDIOVERSION N/A 04/05/2018   Procedure: CARDIOVERSION;  Surgeon: CLelon Perla MD;  [facility]  Service: Cardiovascular;  Laterality: N/A;   COLONOSCOPY WITH PROPOFOL Left 07/16/2018   Procedure: COLONOSCOPY WITH PROPOFOL;  Surgeon: KRonnette Juniper MD;  [facility];  Service: Gastroenterology;  Laterality: Left;   LEFT  HEART CATH AND CORONARY ANGIOGRAPHY N/A 08/04/2018   Procedure: LEFT HEART CATH AND CORONARY ANGIOGRAPHY;  Surgeon: SBelva Crome MD;  [facility];  Service: Cardiovascular;  Laterality: N/A;   None     POLYPECTOMY  07/16/2018   Procedure: POLYPECTOMY;  Surgeon: KRonnette Juniper MD;  [facility];  Service: Gastroenterology;;    Social History   Socioeconomic History   Marital status: Single    Spouse name: Not on file   Number of children: 0   Years of education: 132  Highest education level: Not on file  Occupational History   Occupation: unemployed  Tobacco Use   Smoking status: Never Smoker   Smokeless tobacco: Never Used  VScientific laboratory technicianUse: Never used  Substance and Sexual Activity   Alcohol use: No   Drug use: No   Sexual activity: Not Currently    Birth control/protection: None  Other Topics Concern   Not on file  Social History Narrative   [place] was enrolled in a MPH program at [employer]. But  ran out of money and is no longer attending school. (Note patient has bipolar disorder).      Born in [place] but lived in [place] before coming back to [place].       Primary language is Arabic. Lives with mother and brother.   Social Determinants of Health   Financial Resource Strain:    Difficulty of Paying Living Expenses: Not on file  Food Insecurity:    Worried About Charity fundraiser in the Last Year: Not on file   [employer] of Food in the Last Year: Not on file  Transportation Needs:    Lack of Transportation (Medical): Not on file   Lack of Transportation (Non-Medical): Not on file  Physical Activity:    Days of Exercise per Week: Not on file   Minutes of Exercise per Session: Not on file  Stress:    Feeling of Stress : Not on file  Social Connections:    Frequency of Communication with Friends and Family: Not on file   Frequency of Social Gatherings  with Friends and Family: Not on file   Attends Religious Services: Not on file   Active Member of Clubs or Organizations: Not on file   Attends Archivist Meetings: Not on file   Marital Status: Not on file  Intimate Partner Violence:    Fear of Current or Ex-Partner: Not on file   Emotionally Abused: Not on file   Physically Abused: Not on file   Sexually Abused: Not on file    Family History  Problem Relation Age of Onset   Heart failure Father    Stroke Father    Hypertension Mother    Heart disease Maternal Grandfather     ROS: no fevers or chills, productive cough, hemoptysis, dysphasia, odynophagia, melena, hematochezia, dysuria, hematuria, rash, seizure activity, orthopnea, PND, pedal edema, claudication. Remaining systems are negative.  Physical Exam: Well-developed obese in no acute distress.  Note exam is difficult due to obesity. Skin is warm and dry.  HEENT is normal.  Neck is supple.  Chest is clear to auscultation with normal expansion.  Cardiovascular exam is irregular Abdominal exam nontender or distended. No masses palpated. Extremities show no edema. neuro grossly intact  ECG-August 12, 2020-atrial fibrillation with rapid ventricular response, right axis deviation, cannot rule out septal infarct.  Personally reviewed  Today's electrocardiogram shows atrial fibrillation at a rate of 90, cannot rule out anterior infarct.  Right axis deviation.  A/P  1 persistent atrial fibrillation-continue Xarelto at present dose.  Continue present dose of carvedilol.  2 acute on chronic diastolic congestive heart failure-symptoms have improved somewhat since last office visit but she continues to have dyspnea on exertion.  Continue Demadex at present dose.  I recommended the addition of spironolactone but she declined.  We discussed fluid restriction and low-sodium diet.  I will have her see an APP in 6 to 8 weeks to make sure that her volume status  remains stable.  I will see her back in 6 months.  3 hypertension-blood pressure controlled.  Continue present medical regimen.  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  5 history of chronic chest pain-symptoms unchanged.  No plans for further ischemia evaluation.  6 morbid obesity-patient needs weight loss.  [name], MD

## 2020-08-16 DIAGNOSIS — G4733 Obstructive sleep apnea (adult) (pediatric): Secondary | ICD-10-CM | POA: Diagnosis not present

## 2020-08-20 NOTE — Addendum Note (Signed)
Addended by: Rush Landmark L on: 08/20/2020 02:00 PM   Modules accepted: Orders

## 2020-08-22 IMAGING — US US RENAL
1 series · 14 of 25 positions shown · non-contrast
Comparison: None.

CLINICAL DATA: Acute kidney injury.

EXAM:
RENAL / URINARY TRACT ULTRASOUND COMPLETE

[Series 1: us renal · 14 of 50 slices shown]
[im 1/50]
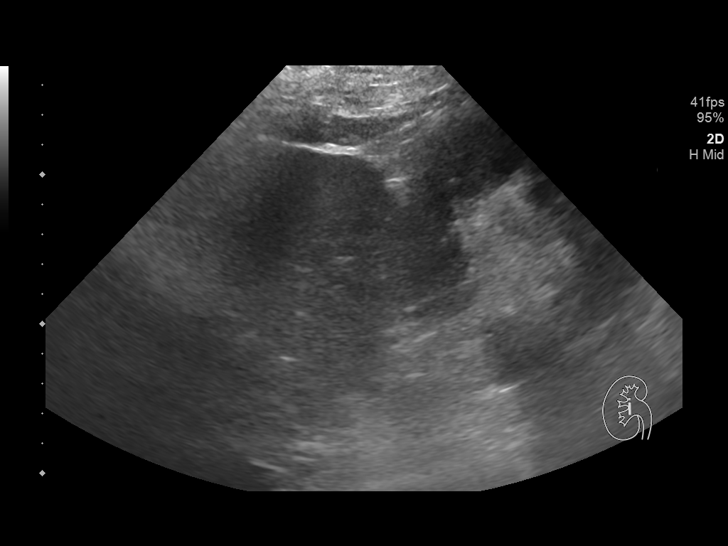
[im 5/50]
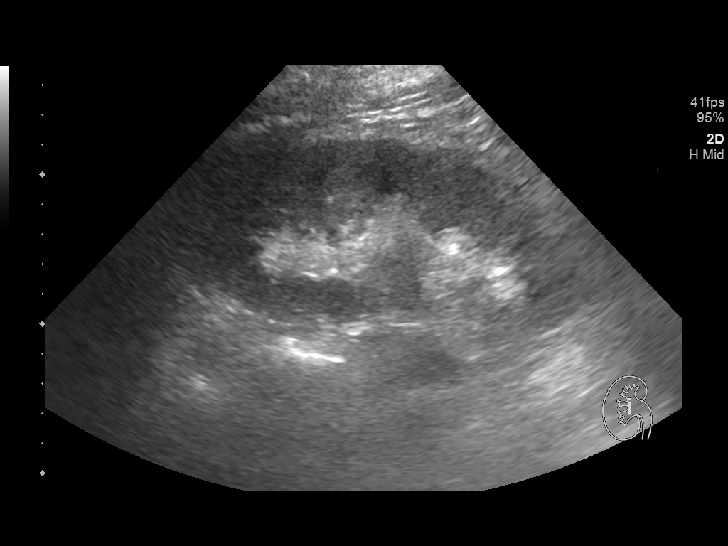
[im 9/50]
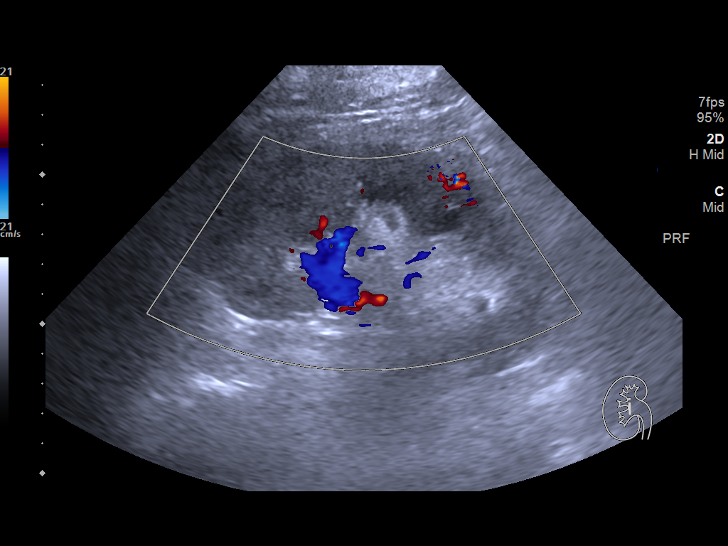
[im 13/50]
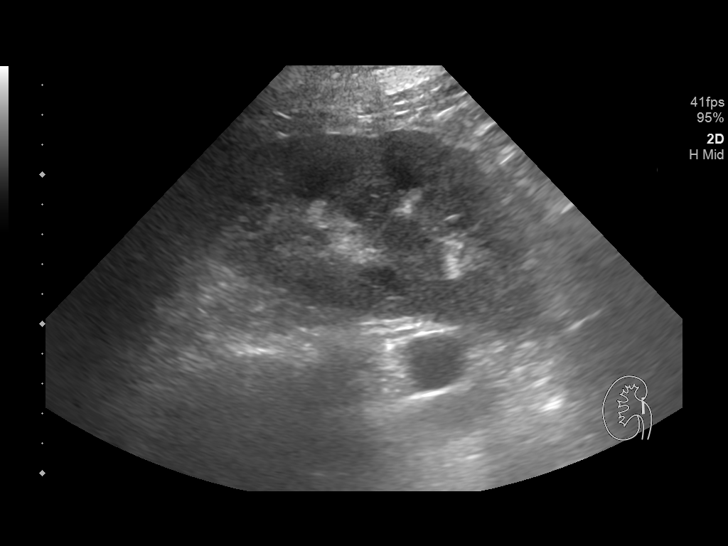
[im 17/50]
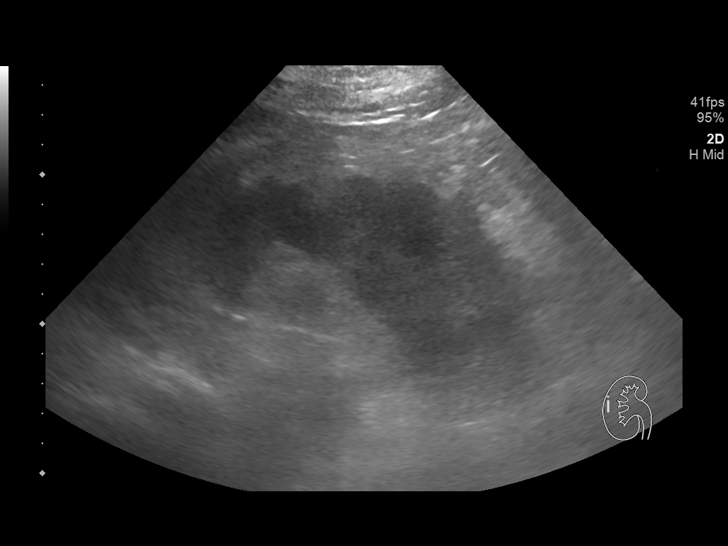
[im 19/50]
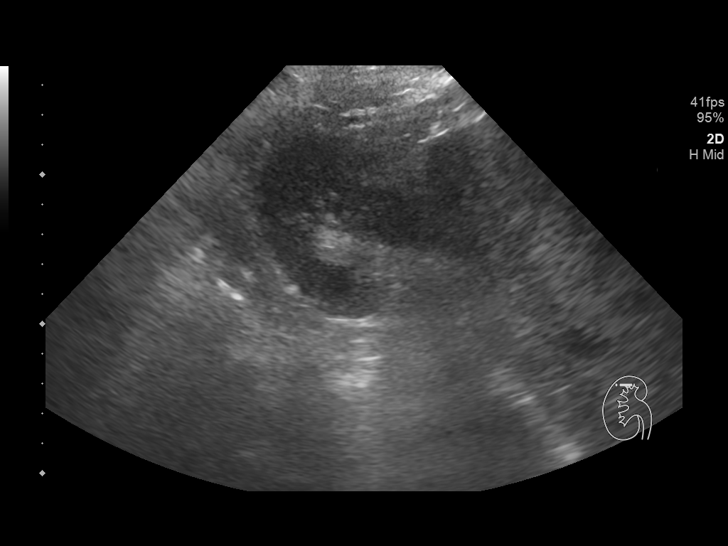
[im 23/50]
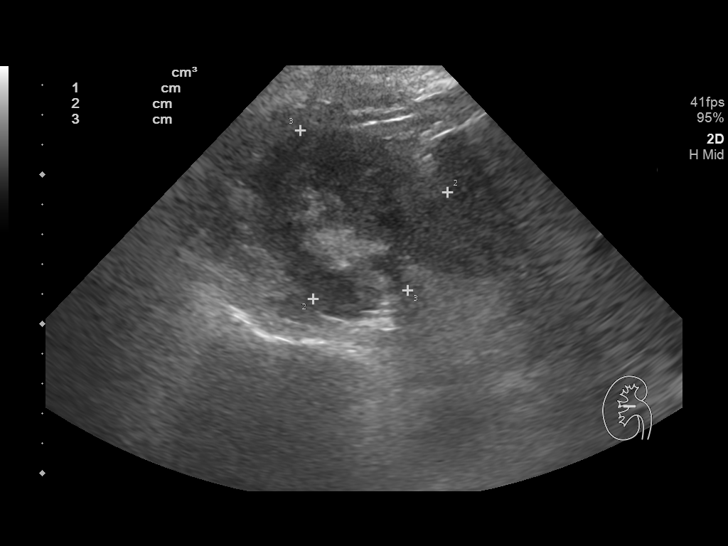
[im 27/50]
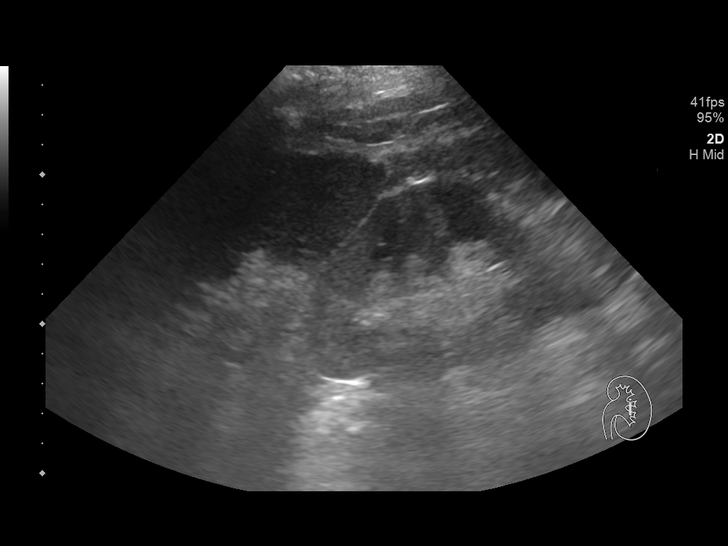
[im 31/50]
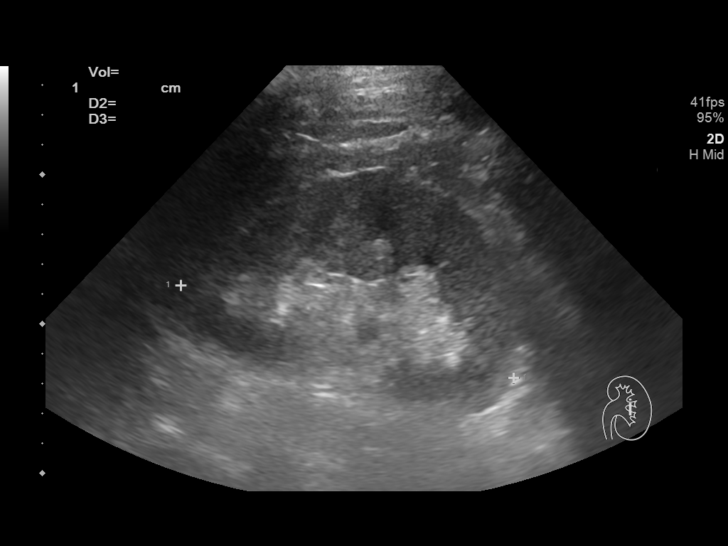
[im 33/50]
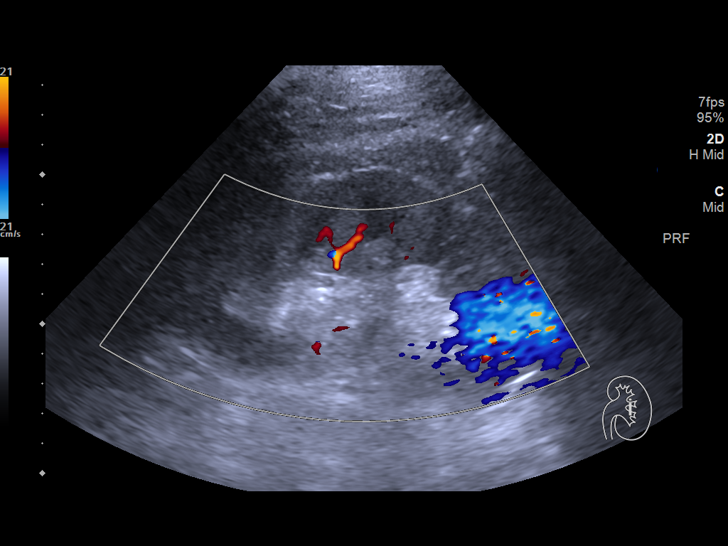
[im 37/50]
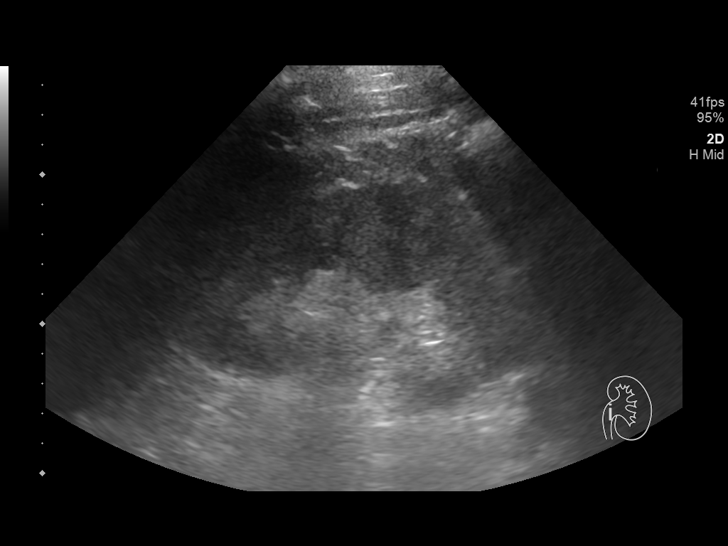
[im 41/50]
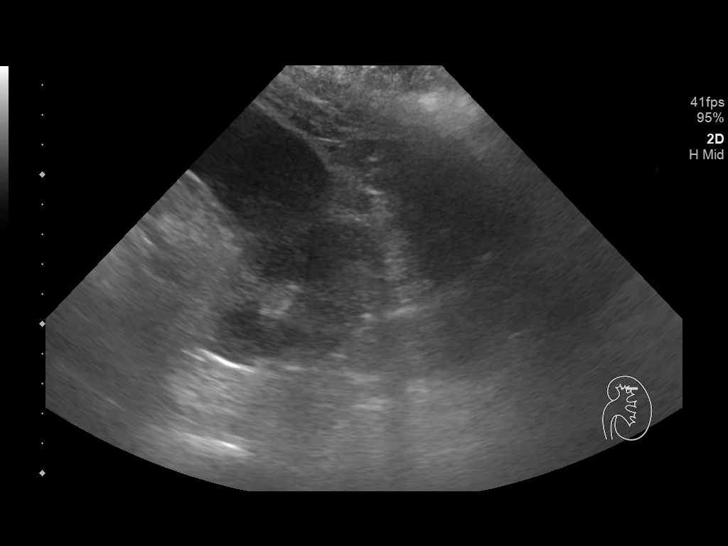
[im 45/50]
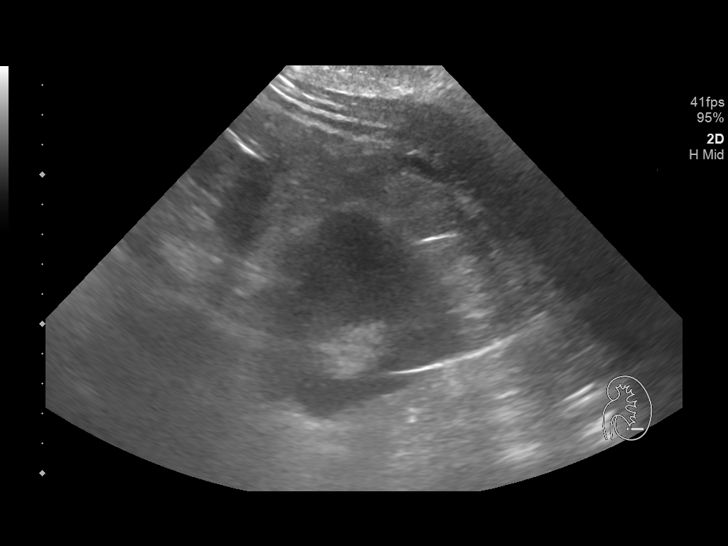
[im 50/50]
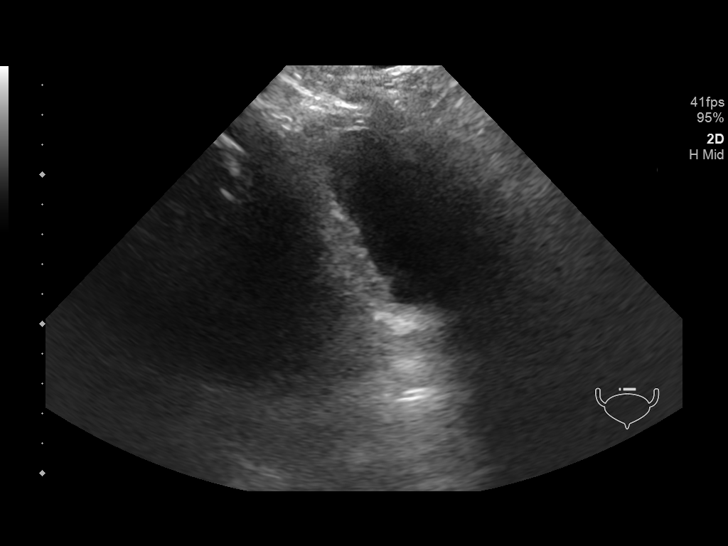

[14 of 25 positions shown; findings below may reference images not displayed]

FINDINGS: Right Kidney:

Renal measurements: 13.1 x 5.8 x 6.5 cm = volume: 253 mL. Question
slightly increased echogenicity. No mass or hydronephrosis
visualized.

Left Kidney:

Renal measurements: 11.6 x 6.2 x 6.5 cm = volume: 245 mL. Increased
echogenicity in the left kidney without hydronephrosis. No
suspicious renal mass.

Bladder:

Difficult to exclude bladder wall thickening or bladder wall
irregularity. However, the bladder is incompletely distended.
IMPRESSION: Negative for hydronephrosis. Question increased echogenicity in the
kidneys, particularly in the left kidney.

Limited evaluation of the bladder but cannot exclude bladder wall
thickening or irregularity.

## 2020-08-26 DIAGNOSIS — G4733 Obstructive sleep apnea (adult) (pediatric): Secondary | ICD-10-CM | POA: Diagnosis not present

## 2020-08-28 DIAGNOSIS — G4733 Obstructive sleep apnea (adult) (pediatric): Secondary | ICD-10-CM | POA: Diagnosis not present

## 2020-09-02 ENCOUNTER — Other Ambulatory Visit: Payer: Self-pay | Admitting: Family Medicine

## 2020-09-02 MED FILL — ESCITALOPRAM 20 MG TABLET: 20 | 30 days supply | Qty: 30 | Fill #0

## 2020-09-04 DIAGNOSIS — I10 Essential (primary) hypertension: Secondary | ICD-10-CM | POA: Diagnosis not present

## 2020-09-04 MED FILL — TORSEMIDE 20 MG TABLET: 20 | 30 days supply | Qty: 240 | Fill #0

## 2020-09-04 MED FILL — METFORMIN HCL 1000 MG TABS: 1000 | 90 days supply | Qty: 180 | Fill #0

## 2020-09-11 MED FILL — metOLazone 2.5 MG TABS: 2.5 | 30 days supply | Qty: 30 | Fill #0

## 2020-09-17 ENCOUNTER — Ambulatory Visit (INDEPENDENT_AMBULATORY_CARE_PROVIDER_SITE_OTHER): Payer: Medicaid Other | Admitting: Cardiology

## 2020-09-17 ENCOUNTER — Encounter: Payer: Self-pay | Admitting: Cardiology

## 2020-09-17 ENCOUNTER — Other Ambulatory Visit: Payer: Self-pay

## 2020-09-17 VITALS — BP 132/90 | HR 89 | Ht 67.0 in | Wt 333.0 lb

## 2020-09-17 DIAGNOSIS — Z79899 Other long term (current) drug therapy: Secondary | ICD-10-CM | POA: Diagnosis not present

## 2020-09-17 DIAGNOSIS — Z7901 Long term (current) use of anticoagulants: Secondary | ICD-10-CM | POA: Diagnosis not present

## 2020-09-17 DIAGNOSIS — I4819 Other persistent atrial fibrillation: Secondary | ICD-10-CM

## 2020-09-17 DIAGNOSIS — I5033 Acute on chronic diastolic (congestive) heart failure: Secondary | ICD-10-CM | POA: Diagnosis not present

## 2020-09-17 DIAGNOSIS — E119 Type 2 diabetes mellitus without complications: Secondary | ICD-10-CM

## 2020-09-17 DIAGNOSIS — F319 Bipolar disorder, unspecified: Secondary | ICD-10-CM | POA: Diagnosis not present

## 2020-09-17 DIAGNOSIS — I10 Essential (primary) hypertension: Secondary | ICD-10-CM

## 2020-09-17 NOTE — Assessment & Plan Note (Signed)
The patient had uncontrolled loose stools in the office today- she attributes this to Metformin Rx

## 2020-09-17 NOTE — Assessment & Plan Note (Signed)
BMI 51 

## 2020-09-17 NOTE — Assessment & Plan Note (Signed)
Rate controlled 

## 2020-09-17 NOTE — Assessment & Plan Note (Signed)
Symptoms improved though her weight is the same- check labs including BNP

## 2020-09-17 NOTE — Progress Notes (Addendum)
Cardiology Office Note:    Date:  09/17/2020   ID:  [name], [name] November 23, 1965, MRN [id number]  PCP:  [name], NP  Cardiologist:  [name], MD  Electrophysiologist:  None   Referring MD: [name], NP   No chief complaint on file.   History of Present Illness:    [name] is a 54 y.o. female from [place] a hx of D-CHF,PAFw/ failed DCCV 02/2018 &04/05/2018, CHA2DS2-VASc =3 (female, CHF, HTN) on Xarelto, HTN,morbidobesity, chronic intermittent chest pain with normal coronaries at cath Nov 2019,chronic anemia.OSAnot on CPAP,and prior Mycobacterium avium infection.   She is in the office today for routine follow up.  She has chronic DOE but this appears to be unchanged.  She has been incontinent of stool and she attributes this to metformin Rx.  Past Medical History:  Diagnosis Date  . Acute on chronic diastolic congestive heart failure ([place]) 11/02/2013   10/03/2015, 11/13/2015, 08/03/2017  . Benign essential HTN 11/28/2013  . Bipolar disease, chronic ([place])   . Chest pain    a. 2012 Myoview: EF 63%, no isch/infarct;  b. 04/2016 Lexiscan MV: EF 73%, no ischemia/infarct-->Low risk.  . Chronic diastolic CHF (congestive heart failure) ([place]) 07/23/2011   a. 2015 Echo: EF 55-60%, Gr2 DD;  b. 09/2015 Echo: EF 60-65%, no rwma, mod dil LA, PASP 32mHg.  . Cor pulmonale (chronic) ([facility]   . History of thyrotoxicosis   . HTN (hypertension) 11/28/2013  . Hypertensive heart disease 10/18/2013  . Hypoglycemia   . Insulin dependent type 2 diabetes mellitus, uncontrolled ([facility]   . Mediastinal adenopathy   . Morbid obesity due to excess calories ([facility] 02/19/2011  . Morbid obesity with BMI of 50.0-59.9, adult ([facility]   . OSA (obstructive sleep apnea) 03/06/2011  . Persistent atrial fibrillation ([facility] [facility] 12/09/2017  . Pulmonary HTN, moderate to severe 11/03/2013  . Sinusitis, chronic 01/02/2015  . SVT (supraventricular tachycardia) ([facility] 12/06/2013  . Uncontrolled type 2  diabetes mellitus with hyperglycemia ([facility]     Past Surgical History:  Procedure Laterality Date  . CARDIOVERSION N/A 04/05/2018   Procedure: CARDIOVERSION;  Surgeon: CLelon Perla MD;  [facility];  Service: Cardiovascular;  Laterality: N/A;  . COLONOSCOPY WITH PROPOFOL Left 07/16/2018   Procedure: COLONOSCOPY WITH PROPOFOL;  Surgeon: KRonnette Juniper MD;  [facility];  Service: Gastroenterology;  Laterality: Left;  . LEFT HEART CATH AND CORONARY ANGIOGRAPHY N/A 08/04/2018   Procedure: LEFT HEART CATH AND CORONARY ANGIOGRAPHY;  Surgeon: SBelva Crome MD;  [facility];  Service: Cardiovascular;  Laterality: N/A;  . None    . POLYPECTOMY  07/16/2018   Procedure: POLYPECTOMY;  Surgeon: KRonnette Juniper MD;  [facility];  Service: Gastroenterology;;    Current Medications: Current Meds  Medication Sig  . Accu-Chek Softclix Lancets lancets USE AS DIRECTED TO TEST BLOOD SUGAR ONCE DAILY  . albuterol (PROAIR HFA) 108 (90 Base) MCG/ACT inhaler INHALE 1 TO 2 PUFFS EVERY 6 HOURS AS NEEDED FOR WHEEZING/ SHORTNESS OF BREATH  . amLODipine (NORVASC) 10 MG tablet Take 1 tablet (10 mg total) by mouth daily.  .Marland Kitchenatorvastatin (LIPITOR) 40 MG tablet Take 1 tablet (40 mg total) by mouth daily at 6 PM.  . Blood Glucose Monitoring Suppl (ACCU-CHEK AVIVA PLUS) w/Device KIT 1 each by Does not apply route daily. USE AS DIRECTED TO TEST BLOOD SUGAR ONCE DAILY  . Blood Glucose Monitoring Suppl (TRUE METRIX METER) w/Device KIT Use as instructed. Check blood glucose levels  by fingerstick twice per day  . Blood Pressure Monitor DEVI Please provide patient with insurance approved blood pressure monitor I10.0  . carvedilol (COREG) 25 MG tablet Take 1 tablet (25 mg total) by mouth 2 (two) times daily with a meal.  . escitalopram (LEXAPRO) 10 MG tablet Take 1 tablet (10 mg total) by mouth daily.  . ferrous sulfate (FEROSUL) 325 (65 FE) MG tablet Take 1 tablet (325 mg total) by  mouth 2 (two) times daily.  Marland Kitchen glipiZIDE (GLUCOTROL XL) 10 MG 24 hr tablet Take 1 tablet (10 mg total) by mouth every evening.  Marland Kitchen glucose blood (ACCU-CHEK AVIVA PLUS) test strip USE AS DIRECTED TO TEST BLOOD SUGAR ONCE DAILY  . Incontinence Supply Disposable (INCONTINENCE BRIEF LARGE) MISC Please provide patient with insurance approved incontinence supplies/briefs  . Misc. Devices MISC Please provide BiPAP machine with the following: Settings 22/18 cm H2O. Medium  size [employer] Full Face Mask Simplus mask and heated  humidification.  . Misc. Devices MISC Please provide patient a Blood Pressure Monitor w/ insurance approval.  . oxybutynin (DITROPAN) 5 MG tablet Take 5 mg by mouth 3 (three) times daily.  . Potassium Chloride ER 20 MEQ TBCR Take 20 mEq by mouth 2 (two) times daily.  . risperidone (RISPERDAL) 4 MG tablet Take 1 tablet (4 mg total) by mouth daily.  . rivaroxaban (XARELTO) 20 MG TABS tablet TAKE 1 TABLET (20 MG TOTAL) BY MOUTH DAILY WITH SUPPER.  Marland Kitchen torsemide (DEMADEX) 20 MG tablet Take 4 tablets (80 mg total) by mouth 2 (two) times daily.  . Vitamin D, Ergocalciferol, (DRISDOL) 1.25 MG (50000 UNIT) CAPS capsule Take 1 capsule (50,000 Units total) by mouth every 7 (seven) days.     Allergies:   Acetaminophen, Caffeine, Farxiga [dapagliflozin], and Lisinopril   Social History   Socioeconomic History  . Marital status: Single    Spouse name: Not on file  . Number of children: 0  . Years of education: 34  . Highest education level: Not on file  Occupational History  . Occupation: unemployed  Tobacco Use  . Smoking status: Never Smoker  . Smokeless tobacco: Never Used  Vaping Use  . Vaping Use: Never used  Substance and Sexual Activity  . Alcohol use: No  . Drug use: No  . Sexual activity: Not Currently    Birth control/protection: None  Other Topics Concern  . 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 Then was enrolled in a MPH program at [employer]. But ran out of money and is no longer attending school. (Note patient has bipolar disorder).      Born in [place] but lived in [place] before coming back to [place].       Primary language is Arabic. Lives with mother and brother.   Social Determinants of Health   Financial Resource Strain: Not on file  Food Insecurity: Not on file  Transportation Needs: Not on file  Physical Activity: Not on file  Stress: Not on file  Social Connections: Not on file     Family History: The patient's family history includes Heart disease in her maternal grandfather; Heart failure in her father; Hypertension in her mother; Stroke in her father.  ROS:   Please see the history of present illness.     All other systems reviewed and are negative.  EKGs/Labs/Other Studies Reviewed:    The following studies were reviewed  today: Cath 2019  EKG:  EKG is not ordered today.  The ekg ordered 11/18/021 demonstrates AF HR 90, RAD, poor anterior RW  Recent Labs: 12/19/2019: Magnesium 1.6 08/12/2020: ALT 11; B Natriuretic Peptide 260.0; BUN 15; Creatinine, Ser 1.02; Hemoglobin 11.4; Platelets 258; Potassium 3.8; Sodium 137  Recent Lipid Panel    Component Value Date/Time   CHOL 86 12/16/2019 0302   CHOL 146 05/23/2019 1645   TRIG 57 12/16/2019 0302   HDL 30 (L) 12/16/2019 0302   HDL 44 05/23/2019 1645   CHOLHDL 2.9 12/16/2019 0302   VLDL 11 12/16/2019 0302   LDLCALC 45 12/16/2019 0302   LDLCALC 68 05/23/2019 1645    Physical Exam:    VS:  BP 132/90   Pulse 89   Ht '5\' 7"'  (1.702 m)   Wt (!) 333 lb (151 kg)   SpO2 96%   BMI 52.16 kg/m     Wt Readings from Last 3 Encounters:  09/17/20 (!) 333 lb (151 kg)  08/15/20 (!) 330 lb (149.7 kg)  08/07/20 (!) 337 lb (152.9 kg)     GEN: obese female, well developed in no acute distress HEENT: Normal NECK: No JVD; No carotid bruits CARDIAC: irregularly irregular no murmurs, rubs, gallops RESPIRATORY:   Clear to auscultation without rales, wheezing or rhonchi  ABDOMEN: Soft, non-tender, non-distended MUSCULOSKELETAL:  No edema; No deformity  SKIN: Warm and dry NEUROLOGIC:  Alert and oriented x 3 PSYCHIATRIC:  Normal affect   ASSESSMENT:    Acute on chronic diastolic congestive heart failure (HCC) Symptoms improved though her weight is the same- check labs including BNP  Persistent atrial fibrillation Rate controlled  Chronic anticoagulation CHADS VASC=4  Essential hypertension Controlled  Morbid obesity (HCC) BMI 51  Non-insulin dependent type 2 diabetes mellitus ([place]) The patient had uncontrolled loose stools in the office today- she attributes this to Metformin Rx   Normal coronaries- Cath 2019  PLAN:    Same cardiac Rx- stop Metformin- f/u with PCP.  Check labs today.    Medication Adjustments/Labs and Tests Ordered: Current medicines are reviewed at length with the patient today.  Concerns regarding medicines are outlined above.  Orders Placed This Encounter  Procedures  . CBC  . Basic Metabolic Panel (BMET)  . B Nat Peptide   No orders of the defined types were placed in this encounter.   Patient Instructions  Medication Instructions:  Continue current medications  *If you need a refill on your cardiac medications before your next appointment, please call your pharmacy*   Lab Work: CBC, BMP and BNP  If you have labs (blood work) drawn today and your tests are completely normal, you will receive your results only by: Marland Kitchen MyChart Message (if you have MyChart) OR . A paper copy in the mail If you have any lab test that is abnormal or we need to change your treatment, we will call you to review the results.   Testing/Procedures: None Ordered   Follow-Up: At [facility], you and your health needs are our priority.  As part of our continuing mission to provide you with exceptional heart care, we have created designated Provider Care Teams.  These  Care Teams include your primary Cardiologist (physician) and Advanced Practice Providers (APPs -  Physician Assistants and Nurse Practitioners) who all work together to provide you with the care you need, when you need it.  We recommend signing up for the patient portal called "MyChart".  Sign up information is provided on this  After Visit Summary.  MyChart is used to connect with patients for Virtual Visits (Telemedicine).  Patients are able to view lab/test results, encounter notes, upcoming appointments, etc.  Non-urgent messages can be sent to your provider as well.   To learn more about what you can do with MyChart, go to [email or web address].    Your next appointment:   3 month(s)  The format for your next appointment:   In Person  Provider:   You may see [name], MD or one of the following Advanced Practice Providers on your designated Care Team:    Kerin Ransom, PA-C  [place], [place]  Coletta Memos, FNP        Signed, Kerin Ransom, [place]  09/17/2020 12:46 PM    [place]

## 2020-09-17 NOTE — Patient Instructions (Signed)
Medication Instructions:  Continue current medications  *If you need a refill on your cardiac medications before your next appointment, please call your pharmacy*   Lab Work: CBC, BMP and BNP  If you have labs (blood work) drawn today and your tests are completely normal, you will receive your results only by: Marland Kitchen MyChart Message (if you have MyChart) OR . A paper copy in the mail If you have any lab test that is abnormal or we need to change your treatment, we will call you to review the results.   Testing/Procedures: None Ordered   Follow-Up: At [facility], you and your health needs are our priority.  As part of our continuing mission to provide you with exceptional heart care, we have created designated Provider Care Teams.  These Care Teams include your primary Cardiologist (physician) and Advanced Practice Providers (APPs -  Physician Assistants and Nurse Practitioners) who all work together to provide you with the care you need, when you need it.  We recommend signing up for the patient portal called "MyChart".  Sign up information is provided on this After Visit Summary.  MyChart is used to connect with patients for Virtual Visits (Telemedicine).  Patients are able to view lab/test results, encounter notes, upcoming appointments, etc.  Non-urgent messages can be sent to your provider as well.   To learn more about what you can do with MyChart, go to [email or web address].    Your next appointment:   3 month(s)  The format for your next appointment:   In Person  Provider:   You may see [name], MD or one of the following Advanced Practice Providers on your designated Care Team:    Kerin Ransom, PA-C  [place], [place]  Coletta Memos, [place]

## 2020-09-17 NOTE — Assessment & Plan Note (Signed)
Controlled.  

## 2020-09-17 NOTE — Assessment & Plan Note (Signed)
CHADS VASC=4 

## 2020-09-18 ENCOUNTER — Telehealth: Payer: Self-pay | Admitting: Cardiology

## 2020-09-18 DIAGNOSIS — I5033 Acute on chronic diastolic (congestive) heart failure: Secondary | ICD-10-CM

## 2020-09-18 DIAGNOSIS — F319 Bipolar disorder, unspecified: Secondary | ICD-10-CM

## 2020-09-18 LAB — BASIC METABOLIC PANEL
BUN/Creatinine Ratio: 14 (ref 9–23)
BUN: 14 mg/dL (ref 6–24)
CO2: 28 mmol/L (ref 20–29)
Calcium: 9.6 mg/dL (ref 8.7–10.2)
Chloride: 99 mmol/L (ref 96–106)
Creatinine, Ser: 0.98 mg/dL (ref 0.57–1.00)
GFR calc Af Amer: 77 mL/min/{1.73_m2} (ref >59)
GFR calc non Af Amer: 67 mL/min/{1.73_m2} (ref >59)
Glucose: 222 mg/dL — ABNORMAL HIGH (ref 65–99)
Potassium: 4.1 mmol/L (ref 3.5–5.2)
Sodium: 143 mmol/L (ref 134–144)

## 2020-09-18 LAB — CBC
Hematocrit: 39.7 % (ref 34.0–46.6)
Hemoglobin: 12.4 g/dL (ref 11.1–15.9)
MCH: 25.7 pg — ABNORMAL LOW (ref 26.6–33.0)
MCHC: 31.2 g/dL — ABNORMAL LOW (ref 31.5–35.7)
MCV: 82 fL (ref 79–97)
Platelets: 208 10*3/uL (ref 150–450)
RBC: 4.83 x10E6/uL (ref 3.77–5.28)
RDW: 15.3 % (ref 11.7–15.4)
WBC: 5.8 10*3/uL (ref 3.4–10.8)

## 2020-09-18 LAB — BRAIN NATRIURETIC PEPTIDE: BNP: 234.6 pg/mL — ABNORMAL HIGH (ref 0.0–100.0)

## 2020-09-18 NOTE — Telephone Encounter (Signed)
Returned call to [facility] who states that patient will be completing remote health and wanted to see if Dr. [name] would be able to arrange for patient to start Cardiac Rehab or a para medicine program. Lattie Haw states that they have arranged [facility] for transport so patient will be able to attend appointments. Guy Franco that I would forward message to Dr. [name].

## 2020-09-18 NOTE — Telephone Encounter (Signed)
Ok for cardiac rehab [name]

## 2020-09-18 NOTE — Telephone Encounter (Signed)
[name] with Remote health called in and stated that pt will discharge from them today and wanted to see if she could be enroll in a paramedicine program and cardiac rehab   Best number [phone]

## 2020-09-25 NOTE — Telephone Encounter (Signed)
Misty Stanley aware and referral for cardiac rehab placed.

## 2020-09-26 ENCOUNTER — Ambulatory Visit: Payer: Medicaid Other | Admitting: Adult Health

## 2020-09-28 DIAGNOSIS — I2781 Cor pulmonale (chronic): Secondary | ICD-10-CM | POA: Diagnosis not present

## 2020-09-28 DIAGNOSIS — G4733 Obstructive sleep apnea (adult) (pediatric): Secondary | ICD-10-CM | POA: Diagnosis not present

## 2020-09-28 DIAGNOSIS — J849 Interstitial pulmonary disease, unspecified: Secondary | ICD-10-CM | POA: Diagnosis not present

## 2020-10-01 ENCOUNTER — Other Ambulatory Visit: Payer: Self-pay | Admitting: Nurse Practitioner

## 2020-10-01 DIAGNOSIS — I4819 Other persistent atrial fibrillation: Secondary | ICD-10-CM

## 2020-10-01 MED FILL — TORSEMIDE 20 MG TABLET: 20 | 30 days supply | Qty: 240 | Fill #4

## 2020-10-01 MED FILL — AMLODIPINE BESYLATE 10 MG T: 10 | 90 days supply | Qty: 90 | Fill #0

## 2020-10-01 MED FILL — POTASSIUM CL ER 20 MEQ TAB: 20 | 30 days supply | Qty: 60 | Fill #2

## 2020-10-01 NOTE — Telephone Encounter (Signed)
Requested Prescriptions  Pending Prescriptions Disp Refills  . XARELTO 20 MG TABS tablet [Pharmacy Med Name: XARELTO 20 MG TABLET 20 Tablet] 90 tablet 1    Sig: TAKE 1 TABLET (20 MG TOTAL) BY MOUTH DAILY WITH SUPPER.     Hematology: Anticoagulants - rivaroxaban Passed - 10/01/2020  3:30 PM      Passed - ALT in normal range and within 180 days    ALT  Date Value Ref Range Status  08/12/2020 11 0 - 44 U/L Final         Passed - AST in normal range and within 180 days    AST  Date Value Ref Range Status  08/12/2020 20 15 - 41 U/L Final         Passed - Cr in normal range and within 360 days    Creat  Date Value Ref Range Status  08/06/2016 0.81 0.50 - 1.10 mg/dL Final   Creatinine, Ser  Date Value Ref Range Status  09/17/2020 0.98 0.57 - 1.00 mg/dL Final   Creatinine, Urine  Date Value Ref Range Status  08/06/2018 197.25 mg/dL Final    Comment:    Performed at [facility], [address], [place], [place] [address]         Passed - HCT in normal range and within 360 days    Hematocrit  Date Value Ref Range Status  09/17/2020 39.7 34.0 - 46.6 % Final         Passed - HGB in normal range and within 360 days    Hemoglobin  Date Value Ref Range Status  09/17/2020 12.4 11.1 - 15.9 g/dL Final         Passed - PLT in normal range and within 360 days    Platelets  Date Value Ref Range Status  09/17/2020 208 150 - 450 x10E3/uL Final         Passed - Valid encounter within last 12 months    Recent Outpatient Visits          1 month ago Shortness of breath at rest   [facility] [place], Shea Stakes, NP   3 months ago Essential hypertension   [facility] [place], [place] W, NP   6 months ago Uncontrolled type 2 diabetes mellitus with hyperglycemia [facility])   [facility] [place], [place] W, NP   10 months ago Uncontrolled type 2 diabetes mellitus with hyperglycemia [facility])    [facility] [place], Shea Stakes, NP   1 year ago Need for Tdap vaccination   [facility] [place], Cornelius Moras, RPH-CPP      Future Appointments            Tomorrow Claiborne Rigg, NP [facility]   In 2 months [place], Madolyn Frieze, MD [facility] [place], [facility]

## 2020-10-02 ENCOUNTER — Other Ambulatory Visit: Payer: Self-pay

## 2020-10-02 ENCOUNTER — Encounter: Payer: Self-pay | Admitting: Nurse Practitioner

## 2020-10-02 ENCOUNTER — Ambulatory Visit: Payer: Medicaid Other | Attending: Nurse Practitioner | Admitting: Nurse Practitioner

## 2020-10-02 ENCOUNTER — Other Ambulatory Visit: Payer: Self-pay | Admitting: Nurse Practitioner

## 2020-10-02 VITALS — BP 122/84 | HR 94 | Temp 98.4°F | Ht 67.0 in | Wt 340.0 lb

## 2020-10-02 DIAGNOSIS — Z1211 Encounter for screening for malignant neoplasm of colon: Secondary | ICD-10-CM

## 2020-10-02 DIAGNOSIS — I10 Essential (primary) hypertension: Secondary | ICD-10-CM

## 2020-10-02 DIAGNOSIS — E785 Hyperlipidemia, unspecified: Secondary | ICD-10-CM

## 2020-10-02 DIAGNOSIS — E1165 Type 2 diabetes mellitus with hyperglycemia: Secondary | ICD-10-CM

## 2020-10-02 DIAGNOSIS — F32A Depression, unspecified: Secondary | ICD-10-CM

## 2020-10-02 LAB — POCT GLYCOSYLATED HEMOGLOBIN (HGB A1C): Hemoglobin A1C: 9.8 % — AB (ref 4.0–5.6)

## 2020-10-02 LAB — GLUCOSE, POCT (MANUAL RESULT ENTRY): POC Glucose: 257 mg/dl — AB (ref 70–99)

## 2020-10-02 MED ORDER — GLIPIZIDE ER 10 MG PO TB24: 10.0000 mg | ORAL_TABLET | Freq: Every evening | ORAL | 1 refills | Status: DC

## 2020-10-02 MED ORDER — ESCITALOPRAM OXALATE 10 MG PO TABS: 10.0000 mg | ORAL_TABLET | Freq: Every day | ORAL | 1 refills | Status: DC

## 2020-10-02 MED ORDER — SITAGLIPTIN PHOSPHATE 100 MG PO TABS: 100.0000 mg | ORAL_TABLET | Freq: Every day | ORAL | 1 refills | Status: DC

## 2020-10-02 MED ORDER — ACCU-CHEK AVIVA PLUS W/DEVICE KIT: 1.0000 | PACK | Freq: Every day | 0 refills | Status: DC

## 2020-10-02 MED ORDER — ACCU-CHEK AVIVA PLUS VI STRP: ORAL_STRIP | 12 refills | Status: DC

## 2020-10-02 MED ORDER — ACCU-CHEK SOFTCLIX LANCETS MISC: 12 refills | Status: DC

## 2020-10-02 MED FILL — XARELTO 20 MG TABLET: 20 | 90 days supply | Qty: 90 | Fill #0

## 2020-10-02 NOTE — Progress Notes (Signed)
Assessment & Plan:  [name] was seen today for follow-up.  Diagnoses and all orders for this visit:  Uncontrolled type 2 diabetes mellitus with hyperglycemia (HCC) -     Glucose (CBG) -     HgB A1c -     sitaGLIPtin (JANUVIA) 100 MG tablet; Take 1 tablet (100 mg total) by mouth daily. -     glipiZIDE (GLUCOTROL XL) 10 MG 24 hr tablet; Take 1 tablet (10 mg total) by mouth every evening. -     Discontinue: glucose blood (ACCU-CHEK AVIVA PLUS) test strip; USE AS DIRECTED TO TEST BLOOD SUGAR ONCE DAILY -     Blood Glucose Monitoring Suppl (ACCU-CHEK AVIVA PLUS) w/Device KIT; 1 each by Does not apply route daily. USE AS DIRECTED TO TEST BLOOD SUGAR ONCE DAILY -     glucose blood (ACCU-CHEK AVIVA PLUS) test strip; USE AS DIRECTED TO TEST BLOOD SUGAR three times  DAILY E11.65 -     Accu-Chek Softclix Lancets lancets; USE AS DIRECTED TO TEST BLOOD SUGAR three times  DAILY E11.65 Continue blood sugar control as discussed in office today, low carbohydrate diet, and regular physical exercise as tolerated, 150 minutes per week (30 min each day, 5 days per week, or 50 min 3 days per week). Keep blood sugar logs with fasting goal of 90-130 mg/dl, post prandial (after you eat) less than 180.  For Hypoglycemia: BS <60 and Hyperglycemia BS >400; contact the clinic ASAP. Annual eye exams and foot exams are recommended.   Dyslipidemia, goal LDL below 70 -     Lipid panel INSTRUCTIONS: Work on a low fat, heart healthy diet and participate in regular aerobic exercise program by working out at least 150 minutes per week; 5 days a week-30 minutes per day. Avoid red meat/beef/steak,  fried foods. junk foods, sodas, sugary drinks, unhealthy snacking, alcohol and smoking.  Drink at least 80 oz of water per day and monitor your carbohydrate intake daily.    Essential  Continue all antihypertensives as prescribed.  Remember to bring in your blood pressure log with you for your follow up appointment.   DASH/Mediterranean Diets are healthier choices for HTN.    Depression, unspecified depression type -     escitalopram (LEXAPRO) 10 MG tablet; Take 1 tablet (10 mg total) by mouth daily.  Colon cancer screening -     Fecal occult blood, imunochemical([facility])    Patient has been counseled on age-appropriate routine health concerns for screening and prevention. These are reviewed and up-to-date. Referrals have been placed accordingly. Immunizations are up-to-date or declined.    Subjective:   Chief Complaint  Patient presents with  . Follow-up    Pt. Is here for diabetes follow up.   HPI [name] [age] y.o. female presents to office today for follow up.  PMH significant for:  AonC diastolic congestive heart failure (11/02/2013), Benign essential HTN (11/28/2013), Bipolar disease, chronic, Cor pulmonale, History of thyrotoxicosis, DM2,  Mediastinal adenopathy,  Morbid obesity with BMI >50, OSA (obstructive sleep apnea) DOES NOT USE BIPAP,   Persistent Afib (12/09/2017), Pulmonary HTN, moderate to severe (11/03/2013),  SVT (12/06/2013)   DM2 Poorly controlled. She was unable to take farxiga due to side effects. Unwilling to try any injectables or insulin. Adamantly refused. Currently taking glucotrol XL10 mg and metformin although we had previously discussed her stopping metformin due to loose stools. Blood glucose levels 200s at home. She is eating a large amount of carbs throughout the day. Will add [place]. She  will return in 3-4 weeks for meter check. She is aware that her diabetes is poorly controlled and the risks associated with this. LDL at goal. She is not taking atorvastatin 40 mg.  Lab Results  Component Value Date   HGBA1C 9.8 (A) 10/02/2020   Lab Results  Component Value Date   LDLCALC 45 12/16/2019   Essential Hypertension Well controlled. Taking amlodipine 10 mg daily, carvedilol 25 mg BID, torsemide 80 mg BID. Denies chest pain, lightheadedness, dizziness,  headaches. She has chronic shortness of breath. Weight is up. I believe there may be a component of noncompliance with taking her medications.  BP Readings from Last 3 Encounters:  10/02/20 122/84  09/17/20 132/90  08/15/20 138/78   Review of Systems  Constitutional: Negative for fever, malaise/fatigue and weight loss.  HENT: Negative.  Negative for nosebleeds.   Eyes: Negative.  Negative for blurred vision, double vision and photophobia.  Respiratory: Positive for shortness of breath (chronic ). Negative for cough.   Cardiovascular: Negative.  Negative for chest pain, palpitations and leg swelling.  Gastrointestinal: Negative.  Negative for heartburn, nausea and vomiting.  Musculoskeletal: Negative.  Negative for myalgias.  Neurological: Negative.  Negative for dizziness, focal weakness, seizures and headaches.  Psychiatric/Behavioral: Positive for depression. Negative for suicidal ideas.    Past Medical History:  Diagnosis Date  . Acute on chronic diastolic congestive heart failure ([place]) 11/02/2013   10/03/2015, 11/13/2015, 08/03/2017  . Benign essential HTN 11/28/2013  . Bipolar disease, chronic ([place])   . Chest pain    a. 2012 Myoview: EF 63%, no isch/infarct;  b. 04/2016 Lexiscan MV: EF 73%, no ischemia/infarct-->Low risk.  . Chronic diastolic CHF (congestive heart failure) ([place]) 07/23/2011   a. 2015 Echo: EF 55-60%, Gr2 DD;  b. 09/2015 Echo: EF 60-65%, no rwma, mod dil LA, PASP 17mHg.  . Cor pulmonale (chronic) ([facility]   . History of thyrotoxicosis   . HTN (hypertension) 11/28/2013  . Hypertensive heart disease 10/18/2013  . Hypoglycemia   . Insulin dependent type 2 diabetes mellitus, uncontrolled ([facility]   . Mediastinal adenopathy   . Morbid obesity due to excess calories ([place] 02/19/2011  . Morbid obesity with BMI of 50.0-59.9, adult ([facility]   . OSA (obstructive sleep apnea) 03/06/2011  . Persistent atrial fibrillation ([facility] [place] 12/09/2017  . Pulmonary HTN, moderate to severe 11/03/2013  .  Sinusitis, chronic 01/02/2015  . SVT (supraventricular tachycardia) ([facility] 12/06/2013  . Uncontrolled type 2 diabetes mellitus with hyperglycemia ([facility]     Past Surgical History:  Procedure Laterality Date  . CARDIOVERSION N/A 04/05/2018   Procedure: CARDIOVERSION;  Surgeon: CLelon Perla MD;  [facility];  Service: Cardiovascular;  Laterality: N/A;  . COLONOSCOPY WITH PROPOFOL Left 07/16/2018   Procedure: COLONOSCOPY WITH PROPOFOL;  Surgeon: KRonnette Juniper MD;  Location: [facility];  Service: Gastroenterology;  Laterality: Left;  . LEFT HEART CATH AND CORONARY ANGIOGRAPHY N/A 08/04/2018   Procedure: LEFT HEART CATH AND CORONARY ANGIOGRAPHY;  Surgeon: SBelva Crome MD;  [facility];  Service: Cardiovascular;  Laterality: N/A;  . None    . POLYPECTOMY  07/16/2018   Procedure: POLYPECTOMY;  Surgeon: KRonnette Juniper MD;  [facility];  Service: Gastroenterology;;    Family History  Problem Relation Age of Onset  . Heart failure Father   . Stroke Father   . Hypertension Mother   . Heart disease Maternal Grandfather     Social History Reviewed with no changes to be made today.  Outpatient Medications Prior to Visit  Medication Sig Dispense Refill  . albuterol (PROAIR HFA) 108 (90 Base) MCG/ACT inhaler INHALE 1 TO 2 PUFFS EVERY 6 HOURS AS NEEDED FOR WHEEZING/ SHORTNESS OF BREATH 8.5 g 2  . amLODipine (NORVASC) 10 MG tablet Take 1 tablet (10 mg total) by mouth daily. 90 tablet 1  . Blood Pressure Monitor DEVI Please provide patient with insurance approved blood pressure monitor I10.0 1 each 0  . Incontinence Supply Disposable (INCONTINENCE BRIEF LARGE) MISC Please provide patient with insurance approved incontinence supplies/briefs 18 each 6  . Misc. Devices MISC Please provide BiPAP machine with the following: Settings 22/18 cm H2O. Medium  size [employer] Full Face Mask Simplus mask and heated  humidification. 1 each 0  . Misc. Devices MISC  Please provide patient a Blood Pressure Monitor w/ insurance approval. 1 Device 0  . oxybutynin (DITROPAN) 5 MG tablet Take 5 mg by mouth 3 (three) times daily.    . Potassium Chloride ER 20 MEQ TBCR Take 20 mEq by mouth 2 (two) times daily. 60 tablet 11  . risperidone (RISPERDAL) 4 MG tablet Take 1 tablet (4 mg total) by mouth daily. 90 tablet 1  . torsemide (DEMADEX) 20 MG tablet Take 4 tablets (80 mg total) by mouth 2 (two) times daily. 240 tablet 11  . Vitamin D, Ergocalciferol, (DRISDOL) 1.25 MG (50000 UNIT) CAPS capsule Take 1 capsule (50,000 Units total) by mouth every 7 (seven) days. 12 capsule 1  . XARELTO 20 MG TABS tablet TAKE 1 TABLET (20 MG TOTAL) BY MOUTH DAILY WITH SUPPER. 90 tablet 1  . Accu-Chek Softclix Lancets lancets USE AS DIRECTED TO TEST BLOOD SUGAR ONCE DAILY 100 each 12  . Blood Glucose Monitoring Suppl (ACCU-CHEK AVIVA PLUS) w/Device KIT 1 each by Does not apply route daily. USE AS DIRECTED TO TEST BLOOD SUGAR ONCE DAILY 1 kit 0  . Blood Glucose Monitoring Suppl (TRUE METRIX METER) w/Device KIT Use as instructed. Check blood glucose levels by fingerstick twice per day 1 kit 0  . glucose blood (ACCU-CHEK AVIVA PLUS) test strip USE AS DIRECTED TO TEST BLOOD SUGAR ONCE DAILY 100 each 12  . atorvastatin (LIPITOR) 40 MG tablet Take 1 tablet (40 mg total) by mouth daily at 6 PM. (Patient not taking: Reported on 10/02/2020) 90 tablet 2  . carvedilol (COREG) 25 MG tablet Take 1 tablet (25 mg total) by mouth 2 (two) times daily with a meal. 180 tablet 1  . ferrous sulfate (FEROSUL) 325 (65 FE) MG tablet Take 1 tablet (325 mg total) by mouth 2 (two) times daily. 180 tablet 1  . escitalopram (LEXAPRO) 10 MG tablet Take 1 tablet (10 mg total) by mouth daily. 90 tablet 1  . glipiZIDE (GLUCOTROL XL) 10 MG 24 hr tablet Take 1 tablet (10 mg total) by mouth every evening. 90 tablet 1   No facility-administered medications prior to visit.    Allergies  Allergen Reactions  . Acetaminophen  Other (See Comments)    Seizure-like "fits" as a child  . Caffeine     Tense, anxiety, increased urination  . [place] [Dapagliflozin] Other (See Comments)    Hallucinations, drop in blood sugar  . Lisinopril Rash    Rash with lisinopril; but fosinopril is ok per patient       Objective:    BP 122/84 (BP Location: Left Arm, Patient Position: Sitting, Cuff Size: Large)   Pulse 94   Temp 98.4 F (36.9 C) (Oral)  Ht _0  (1.702 m)   Wt (!) 340 lb (154.2 kg)   SpO2 94%   BMI 53.25 kg/m  Wt Readings from Last 3 Encounters:  10/02/20 (!) 340 lb (154.2 kg)  09/17/20 (!) 333 lb (151 kg)  08/15/20 (!) 330 lb (149.7 kg)    Physical Exam Vitals and nursing note reviewed.  Constitutional:      Appearance: She is well-developed and well-nourished.  HENT:     Head: Normocephalic and atraumatic.  Eyes:     Extraocular Movements: EOM normal.  Cardiovascular:     Rate and Rhythm: Normal rate. Rhythm irregular.     Pulses: Intact distal pulses.     Heart sounds: No murmur heard. No friction rub. No gallop.   Pulmonary:     Effort: Pulmonary effort is normal. No tachypnea or respiratory distress.     Breath sounds: Normal breath sounds. No decreased breath sounds, wheezing, rhonchi or rales.  Chest:     Chest wall: No tenderness.  Abdominal:     General: Bowel sounds are normal.     Palpations: Abdomen is soft.  Musculoskeletal:        General: No edema. Normal range of motion.     Cervical back: Normal range of motion.  Skin:    General: Skin is warm and dry.  Neurological:     Mental Status: She is alert and oriented to person, place, and time.     Coordination: Coordination normal.  Psychiatric:        Mood and Affect: Mood and affect normal.        Behavior: Behavior normal. Behavior is cooperative.        Thought Content: Thought content normal.        Judgment: Judgment normal.          Patient has been counseled extensively about nutrition and exercise as well  as the importance of adherence with medications and regular follow-up. The patient was given clear instructions to go to ER or return to medical center if symptoms don't improve, worsen or new problems develop. The patient verbalized understanding.   Follow-up: Return in about 3 weeks (around 10/23/2020) for  meter check with luke. see me in 3 months.   [name], FNP-BC [facility] [place], [place]   10/02/2020, 11:08 PM

## 2020-10-03 LAB — LIPID PANEL
Chol/HDL Ratio: 3 ratio (ref 0.0–4.4)
Cholesterol, Total: 129 mg/dL (ref 100–199)
HDL: 43 mg/dL (ref >39)
LDL Chol Calc (NIH): 52 mg/dL (ref 0–99)
Triglycerides: 214 mg/dL — ABNORMAL HIGH (ref 0–149)
VLDL Cholesterol Cal: 34 mg/dL (ref 5–40)

## 2020-10-03 MED FILL — ACCU-CHEK SOFTCLIX LANCETS: 33 days supply | Qty: 100 | Fill #0

## 2020-10-03 MED FILL — ESCITALOPRAM 10 MG TABLET: 10 | 90 days supply | Qty: 90 | Fill #0

## 2020-10-03 MED FILL — ACCU-CHEK AVIVA PLUS STRP: 32 days supply | Qty: 100 | Fill #0

## 2020-10-03 MED FILL — JANUVIA 100 MG TABLET: 100 | 30 days supply | Qty: 30 | Fill #0

## 2020-10-24 ENCOUNTER — Other Ambulatory Visit: Payer: Self-pay | Admitting: Family Medicine

## 2020-10-24 ENCOUNTER — Other Ambulatory Visit: Payer: Self-pay

## 2020-10-24 ENCOUNTER — Ambulatory Visit: Payer: Medicaid Other | Attending: Nurse Practitioner | Admitting: Pharmacist

## 2020-10-24 ENCOUNTER — Telehealth (HOSPITAL_COMMUNITY): Payer: Self-pay

## 2020-10-24 DIAGNOSIS — E1165 Type 2 diabetes mellitus with hyperglycemia: Secondary | ICD-10-CM | POA: Diagnosis not present

## 2020-10-24 DIAGNOSIS — Z1211 Encounter for screening for malignant neoplasm of colon: Secondary | ICD-10-CM | POA: Diagnosis not present

## 2020-10-24 LAB — GLUCOSE, POCT (MANUAL RESULT ENTRY): POC Glucose: 199 mg/dl — AB (ref 70–99)

## 2020-10-24 MED ORDER — TRULICITY 0.75 MG/0.5ML ~~LOC~~ SOAJ: 0.7500 mg | SUBCUTANEOUS | 0 refills | Status: DC

## 2020-10-24 MED ORDER — METFORMIN HCL 1000 MG PO TABS: 1000.0000 mg | ORAL_TABLET | Freq: Two times a day (BID) | ORAL | 2 refills | Status: DC

## 2020-10-24 MED FILL — TRULICITY 0.75 MG/0.5 ML PE: 0.75 | 28 days supply | Qty: 2 | Fill #0

## 2020-10-24 NOTE — Telephone Encounter (Signed)
Pt insurance is active and benefits verified through [facility]. Co-pay $3.00, DED $0.00/$0.00 met, out of pocket $0.00/$0.00 met, co-insurance 0%. No pre-authorization required. Passport, 10/24/20 _0 :52PM, CWC#[id number]

## 2020-10-24 NOTE — Progress Notes (Signed)
    S:    PCP: Geryl Rankins No chief complaint on file.  Patient arrives in good spirits.  Presents for diabetes evaluation, education, and management Patient was referred and last seen by Primary Care Provider on 10/02/2020.    Patient reports Diabetes was diagnosed 4 years ago. She has had issues with medication non-compliance and medication tolerability in the past. Today, she exhibits sarcastic behavior. She has a dismissive attitude concerning her health and the risks of uncontrolled diabetes.   Family/Social History:  FHx: HF, stroke, HTN, heart disease Tobacco: never smoker Alcohol: no alcohol use   Insurance coverage/medication affordability: [place]  Medication adherence: denies.   Current diabetes medications include: glipizide 10 mg XL daily (pt takes 2 tablets); metformin 1000 mg BID; Januvia 100mg  daily (pt is not taking) Current hypertension medications include: amlodipine 10 mg daily, carvedilol 25 mg BID Current hyperlipidemia medications include: atorvastatin 40 mg daily  Patient denies hypoglycemic events.  Patient reported dietary habits:  - Pt eats a lot of carbs; mostly bread  Patient-reported exercise habits:  - None    Patient reports nocturia (nighttime urination).  Patient denies neuropathy (nerve pain). Patient denies visual changes. Patient reports self foot exams.     O:  POCT: 199   Lab Results  Component Value Date   HGBA1C 9.8 (A) 10/02/2020   There were no vitals filed for this visit.  Lipid Panel     Component Value Date/Time   CHOL 129 10/02/2020 1511   TRIG 214 (H) 10/02/2020 1511   HDL 43 10/02/2020 1511   CHOLHDL 3.0 10/02/2020 1511   CHOLHDL 2.9 12/16/2019 0302   VLDL 11 12/16/2019 0302   LDLCALC 52 10/02/2020 1511    Home fasting blood sugars: 200-250  2 hour post-meal/random blood sugars: none.   Clinical Atherosclerotic Cardiovascular Disease (ASCVD): Yes  The ASCVD Risk score Mikey Bussing DC Jr., et al., 2013) failed  to calculate for the following reasons:   The patient has a prior MI or stroke diagnosis    A/P: Diabetes longstanding currently uncontrolled. Patient is able to verbalize appropriate hypoglycemia management plan. Medication adherence denied. Pt does not wish to take Januvia. Control is suboptimal due to dietary indiscretion and physical inacitivty.  I had a long discussion about risks of uncontrolled DM with the pt. Of note, we emphasized her increased risk of a secondary cardiac event given her cardiac hx. Additionally, we quantified her risk of stroke, dialysis, and lower limb amputations. After this, the patient continued to refuse medications. I told her that she has every right to refuse medication as long as she understands the risks of uncontrolled diabetes. Subsequently, she said she would be willing to try Trulicity.   -Continued metformin, glipizide. Instructed pt to take glipizide as prescribed.  -Removed Januvia since patient is unwilling to take.  -Start Trulicity 2.35 mg weekly. Pt counseled on injection technique.  -Extensively discussed pathophysiology of diabetes, recommended lifestyle interventions, dietary effects on blood sugar control -Counseled on s/sx of and management of hypoglycemia -Next A1C anticipated 12/2020.   ASCVD risk - secondary prevention in patient with diabetes. Last LDL is controlled - high intensity statin indicated. -Continued atorvastatin 40 mg.   Written patient instructions provided.  Total time in face to face counseling 30 minutes.   Follow up [facility] Visit in 2-3 weeks.    Benard Halsted, PharmD, [place], [place] [phone]

## 2020-10-25 LAB — FECAL OCCULT BLOOD, IMMUNOCHEMICAL: Fecal Occult Bld: NEGATIVE

## 2020-10-29 DIAGNOSIS — I2781 Cor pulmonale (chronic): Secondary | ICD-10-CM | POA: Diagnosis not present

## 2020-10-29 DIAGNOSIS — G4733 Obstructive sleep apnea (adult) (pediatric): Secondary | ICD-10-CM | POA: Diagnosis not present

## 2020-10-29 DIAGNOSIS — J849 Interstitial pulmonary disease, unspecified: Secondary | ICD-10-CM | POA: Diagnosis not present

## 2020-10-31 ENCOUNTER — Other Ambulatory Visit: Payer: Self-pay

## 2020-10-31 ENCOUNTER — Encounter: Payer: Self-pay | Admitting: Pharmacist

## 2020-10-31 ENCOUNTER — Other Ambulatory Visit: Payer: Self-pay | Admitting: Family Medicine

## 2020-10-31 ENCOUNTER — Ambulatory Visit: Payer: Medicaid Other | Attending: Nurse Practitioner | Admitting: Pharmacist

## 2020-10-31 DIAGNOSIS — E1165 Type 2 diabetes mellitus with hyperglycemia: Secondary | ICD-10-CM | POA: Diagnosis not present

## 2020-10-31 LAB — GLUCOSE, POCT (MANUAL RESULT ENTRY): POC Glucose: 181 mg/dl — AB (ref 70–99)

## 2020-10-31 MED ORDER — TRULICITY 1.5 MG/0.5ML ~~LOC~~ SOAJ: 1.5000 mg | SUBCUTANEOUS | 2 refills | Status: DC

## 2020-10-31 MED FILL — TRULICITY 1.5 MG/0.5 ML PEN: 1.5 | 28 days supply | Qty: 2 | Fill #0

## 2020-10-31 NOTE — Progress Notes (Signed)
    S:    PCP: Geryl Rankins No chief complaint on file.  Patient arrives in good spirits.  Presents for diabetes evaluation, education, and management Patient was referred and last seen by Primary Care Provider on 10/02/2020. Pharmacy last saw her on 10/24/2020. We started Trulicity.   Today, pt reports doing well. Denies NV, abdominal pain. She likes her Trulicity and reports "feeling good" with it. She denies any additional complaints.   Family/Social History:  FHx: HF, stroke, HTN, heart disease Tobacco: never smoker Alcohol: no alcohol use   Insurance coverage/medication affordability: [place]  Medication adherence reported.   Current diabetes medications include: glipizide 10 mg XL daily; metformin 1000 mg BID; Trulicity 3.84 mg weekly Current hypertension medications include: amlodipine 10 mg daily, carvedilol 25 mg BID Current hyperlipidemia medications include: atorvastatin 40 mg daily  Patient denies hypoglycemic events.  Patient reported dietary habits:  - Pt eats a lot of carbs; mostly bread - This is unchanged since her last visit  Patient-reported exercise habits:  - None    Patient reports nocturia (nighttime urination).  Patient denies neuropathy (nerve pain). Patient denies visual changes. Patient reports self foot exams.     O:  POCT: 181 (ate ~2 hours ago)  Lab Results  Component Value Date   HGBA1C 9.8 (A) 10/02/2020   There were no vitals filed for this visit.  Lipid Panel     Component Value Date/Time   CHOL 129 10/02/2020 1511   TRIG 214 (H) 10/02/2020 1511   HDL 43 10/02/2020 1511   CHOLHDL 3.0 10/02/2020 1511   CHOLHDL 2.9 12/16/2019 0302   VLDL 11 12/16/2019 0302   LDLCALC 52 10/02/2020 1511   Home fasting blood sugars: has not checked since last week  Clinical Atherosclerotic Cardiovascular Disease (ASCVD): Yes  The ASCVD Risk score Mikey Bussing DC Jr., et al., 2013) failed to calculate for the following reasons:   The patient has a  prior MI or stroke diagnosis   A/P: Diabetes longstanding currently uncontrolled. She is not checking her CBGs at home but today's CBG is close to goal considering she ate ~ two hours ago. Patient is able to verbalize appropriate hypoglycemia management plan. Medication adherence reported. Control is suboptimal due to dietary indiscretion and physical inacitivty. -Continued metformin, glipizide.  -Increased Trulicity to 1.5 mg weekly. Pt to pick-up after completing two more injections of the 0.75 mg weekly dose.  -Extensively discussed pathophysiology of diabetes, recommended lifestyle interventions, dietary effects on blood sugar control -Counseled on s/sx of and management of hypoglycemia -Next A1C anticipated 12/2020.   ASCVD risk - secondary prevention in patient with diabetes. Last LDL is controlled - high intensity statin indicated. -Continued atorvastatin 40 mg.   Written patient instructions provided.  Total time in face to face counseling 30 minutes.   Follow up PCP Clinic Visit.  Patient seen with:  Haynes Dage, PharmD Candidate  [employer] 2022  Benard Halsted, PharmD, [place], [place] [phone]

## 2020-11-04 ENCOUNTER — Telehealth: Payer: Self-pay | Admitting: *Deleted

## 2020-11-04 DIAGNOSIS — I5033 Acute on chronic diastolic (congestive) heart failure: Secondary | ICD-10-CM

## 2020-11-04 NOTE — Telephone Encounter (Signed)
Patient does not qualify for cardiac rehab. Referral placed for pulmonary rehab.

## 2020-11-07 ENCOUNTER — Other Ambulatory Visit: Payer: Self-pay | Admitting: Nurse Practitioner

## 2020-11-07 DIAGNOSIS — F3162 Bipolar disorder, current episode mixed, moderate: Secondary | ICD-10-CM

## 2020-11-07 MED FILL — ESCITALOPRAM 20 MG TABLET: 20 | 30 days supply | Qty: 30 | Fill #1

## 2020-11-07 MED FILL — POTASSIUM CL ER 20 MEQ TAB: 20 | 30 days supply | Qty: 60 | Fill #3

## 2020-11-07 MED FILL — TORSEMIDE 20 MG TABLET: 20 | 30 days supply | Qty: 240 | Fill #5

## 2020-11-07 MED FILL — CARVEDILOL 25 MG TABLET: 25 | 90 days supply | Qty: 180 | Fill #1

## 2020-11-07 MED FILL — glipiZIDE XL 10 MG TB24: 10 | 90 days supply | Qty: 90 | Fill #1

## 2020-11-07 MED FILL — FERROUS SULFATE 325 MG TAB: 325 (65 FE) | 90 days supply | Qty: 180 | Fill #1

## 2020-11-07 NOTE — Telephone Encounter (Signed)
Requested medication (s) are due for refill today:   Provider to determine  Requested medication (s) are on the active medication list:   Yes  Future visit scheduled:   No   Last ordered: 03/18/2020 #90, 1 refill  Clinic note:  Returned because this is a non delegated refill per protocol.   Requested Prescriptions  Pending Prescriptions Disp Refills   risperidone (RISPERDAL) 4 MG tablet [Pharmacy Med Name: risperiDONE 4 MG TABS 4 Tablet] 90 tablet 1    Sig: Take 1 tablet (4 mg total) by mouth daily.      Not Delegated - Psychiatry:  Antipsychotics - Second Generation (Atypical) - risperidone Failed - 11/07/2020  2:54 PM      Failed - This refill cannot be delegated      Failed - Prolactin Level (serum) in normal range and within 180 days    No results found for: PROLACTIN, TOTPROLACTIN, LABPROL        Passed - ALT in normal range and within 180 days    ALT  Date Value Ref Range Status  08/12/2020 11 0 - 44 U/L Final          Passed - AST in normal range and within 180 days    AST  Date Value Ref Range Status  08/12/2020 20 15 - 41 U/L Final          Passed - Valid encounter within last 6 months    Recent Outpatient Visits           1 week ago Uncontrolled type 2 diabetes mellitus with hyperglycemia [facility])   [place], Annie Main L, RPH-CPP   2 weeks ago Uncontrolled type 2 diabetes mellitus with hyperglycemia [facility])   [place], Annie Main L, RPH-CPP   1 month ago Uncontrolled type 2 diabetes mellitus with hyperglycemia [facility])   [place], Vernia Buff, NP   3 months ago Shortness of breath at rest   [place], Vernia Buff, NP   4 months ago Essential hypertension   [place], Vernia Buff, NP       Future Appointments             In 1 month Crenshaw, Denice Bors, MD [place]  [facility]

## 2020-11-08 ENCOUNTER — Other Ambulatory Visit: Payer: Self-pay | Admitting: Family Medicine

## 2020-11-08 MED FILL — risperiDONE 4 MG TABS: 4 | 90 days supply | Qty: 90 | Fill #0

## 2020-11-12 ENCOUNTER — Other Ambulatory Visit: Payer: Self-pay | Admitting: Nurse Practitioner

## 2020-11-13 ENCOUNTER — Other Ambulatory Visit: Payer: Self-pay | Admitting: Nurse Practitioner

## 2020-11-13 MED FILL — PROAIR HFA 90 MCG INHALER: 108 (90 BAS | 25 days supply | Qty: 9 | Fill #0

## 2020-11-13 NOTE — Telephone Encounter (Signed)
Requested Prescriptions  Pending Prescriptions Disp Refills  . albuterol (PROAIR HFA) 108 (90 Base) MCG/ACT inhaler [Pharmacy Med Name: PROAIR HFA 90 MCG INHALER 108 (90 BAS Aerosol] 8.5 g 2    Sig: INHALE 1 TO 2 PUFFS EVERY 6 HOURS AS NEEDED FOR WHEEZING/ SHORTNESS OF BREATH     Pulmonology:  Beta Agonists Failed - 11/12/2020  4:53 PM      Failed - One inhaler should last at least one month. If the patient is requesting refills earlier, contact the patient to check for uncontrolled symptoms.      Passed - Valid encounter within last 12 months    Recent Outpatient Visits          1 week ago Uncontrolled type 2 diabetes mellitus with hyperglycemia [facility])   [place], Annie Main L, RPH-CPP   2 weeks ago Uncontrolled type 2 diabetes mellitus with hyperglycemia [facility])   [place], Jarome Matin, RPH-CPP   1 month ago Uncontrolled type 2 diabetes mellitus with hyperglycemia [facility])   [place], Vernia Buff, NP   3 months ago Shortness of breath at rest   [place], NP   4 months ago Essential hypertension   [place], Vernia Buff, NP      Future Appointments            In 1 month Crenshaw, Denice Bors, MD [place] [facility], [facility]

## 2020-11-19 ENCOUNTER — Telehealth (HOSPITAL_COMMUNITY): Payer: Self-pay

## 2020-11-19 NOTE — Telephone Encounter (Signed)
Pt is interested in the pulmonary rehab maintenance program, will forward info to [facility] the pulmonary rehab maintenance coordinator.

## 2020-11-19 NOTE — Telephone Encounter (Signed)
Pt insurance medicaid [employer], will not cover outpatient hospital pulmonary rehab per Natasha P. From [employer] Ref# [id number]

## 2020-11-20 ENCOUNTER — Telehealth: Payer: Self-pay | Admitting: Cardiology

## 2020-11-20 ENCOUNTER — Encounter (HOSPITAL_COMMUNITY): Payer: Self-pay | Admitting: *Deleted

## 2020-11-20 DIAGNOSIS — I5033 Acute on chronic diastolic (congestive) heart failure: Secondary | ICD-10-CM

## 2020-11-20 NOTE — Telephone Encounter (Signed)
Order placed for pulmonary maintenance rehab  Called and spoke to [employer] at rehab-nothing further needed at this time.

## 2020-11-20 NOTE — Telephone Encounter (Signed)
[name] calling to request orders for Pulmonary Maintenance.  Fax: [phone], Phone: [phone]

## 2020-11-20 NOTE — Progress Notes (Signed)
Called pt to discuss the Pulmonary Maintenance program. She is interested in attending. We discussed that it is a self pay program and details about the program. I have set 11/27/2019 for her first day to attend. I instructed her that she needs to eat breakfast and check her blood sugar with in a hour of her arrival. I told her that she also need to bring in her meter so that she can check her blood sugar after she finishes exercising.She did voice understanding.

## 2020-11-21 ENCOUNTER — Other Ambulatory Visit (HOSPITAL_COMMUNITY): Payer: Self-pay | Admitting: *Deleted

## 2020-11-26 ENCOUNTER — Encounter (HOSPITAL_COMMUNITY)
Admission: RE | Admit: 2020-11-26 | Discharge: 2020-11-26 | Disposition: A | Discharge status: Home / Self Care | Payer: Self-pay | Source: Ambulatory Visit | Attending: Cardiology | Admitting: Cardiology

## 2020-11-26 ENCOUNTER — Other Ambulatory Visit: Payer: Self-pay

## 2020-11-26 DIAGNOSIS — I5033 Acute on chronic diastolic (congestive) heart failure: Secondary | ICD-10-CM | POA: Insufficient documentation

## 2020-11-26 DIAGNOSIS — I2781 Cor pulmonale (chronic): Secondary | ICD-10-CM | POA: Diagnosis not present

## 2020-11-26 DIAGNOSIS — J849 Interstitial pulmonary disease, unspecified: Secondary | ICD-10-CM | POA: Diagnosis not present

## 2020-11-26 DIAGNOSIS — G4733 Obstructive sleep apnea (adult) (pediatric): Secondary | ICD-10-CM | POA: Diagnosis not present

## 2020-11-26 NOTE — Progress Notes (Signed)
Rikayla Demmon presented today for their first day of exercise at Pulmonary maintenance.   Entrance Vitals Blood Pressure: 130/74 Heart Rate: 96 Oxygen Saturation: 96 Room air  Exercising Vitals Blood Pressure: 128/88 Heart Rate: 93 Oxygen Saturation: 94 Room air  Exit Vitals Blood Pressure: 120/80 Heart Rate: 102 Oxygen Saturation: 92 RA   Fall Risk: Although patient answered questions with only a a low risk fall score. I am keeping her on high risk due to her severe deconditioning.  GOALS MET Strength training completed today  GOALS UNMET Not Applicable   Additional Comments: Tamu is severely deconditioned and she was only able to exercise 8 minutes and 49 seconds on the T5 nustep a seated exercise. She took  4 rest stops. She said multiple times that she "needed to quit, she couldn't do any more." I gave her much encouragement to take rest stops so that she could continue. I discussed with her that she needed to do some walking or some type of activities outside of her time here to overcome her deconditioned state and to improve.I am having to document for her and set equipment.

## 2020-11-28 ENCOUNTER — Other Ambulatory Visit: Payer: Self-pay

## 2020-11-28 ENCOUNTER — Encounter (HOSPITAL_COMMUNITY)
Admission: RE | Admit: 2020-11-28 | Discharge: 2020-11-28 | Disposition: A | Discharge status: Home / Self Care | Payer: Self-pay | Source: Ambulatory Visit | Attending: Cardiology | Admitting: Cardiology

## 2020-11-28 MED FILL — TRULICITY 1.5 MG/0.5 ML PEN: 1.5 | 28 days supply | Qty: 2 | Fill #0

## 2020-12-03 ENCOUNTER — Other Ambulatory Visit: Payer: Self-pay

## 2020-12-03 ENCOUNTER — Encounter (HOSPITAL_COMMUNITY)
Admission: RE | Admit: 2020-12-03 | Discharge: 2020-12-03 | Disposition: A | Discharge status: Home / Self Care | Payer: Self-pay | Source: Ambulatory Visit | Attending: Cardiology | Admitting: Cardiology

## 2020-12-05 ENCOUNTER — Other Ambulatory Visit: Payer: Self-pay

## 2020-12-05 ENCOUNTER — Encounter (HOSPITAL_COMMUNITY)
Admission: RE | Admit: 2020-12-05 | Discharge: 2020-12-05 | Disposition: A | Discharge status: Home / Self Care | Payer: Self-pay | Source: Ambulatory Visit | Attending: Cardiology | Admitting: Cardiology

## 2020-12-09 ENCOUNTER — Telehealth: Payer: Self-pay | Admitting: Nurse Practitioner

## 2020-12-09 NOTE — Telephone Encounter (Signed)
Medication Refill - Medication:   Potassium 20 Metformin 1000 Torsemide 20 mg  Atorvastatin 40   Has the patient contacted their [place]  pt tried to call and could not get through  (Agent: If no, request that the patient contact the pharmacy for  Agent: Please be advised that RX refills may take up to 3 business days. We ask that you follow-up with your pharmacy.

## 2020-12-09 NOTE — Telephone Encounter (Signed)
Pt was called and informed that she has refills on all medications.

## 2020-12-10 ENCOUNTER — Encounter (HOSPITAL_COMMUNITY)
Admission: RE | Admit: 2020-12-10 | Discharge: 2020-12-10 | Disposition: A | Discharge status: Home / Self Care | Payer: Self-pay | Source: Ambulatory Visit | Attending: Cardiology | Admitting: Cardiology

## 2020-12-10 ENCOUNTER — Other Ambulatory Visit: Payer: Self-pay

## 2020-12-12 ENCOUNTER — Other Ambulatory Visit: Payer: Self-pay

## 2020-12-12 ENCOUNTER — Encounter (HOSPITAL_COMMUNITY)
Admission: RE | Admit: 2020-12-12 | Discharge: 2020-12-12 | Disposition: A | Discharge status: Home / Self Care | Payer: Self-pay | Source: Ambulatory Visit | Attending: Cardiology | Admitting: Cardiology

## 2020-12-17 ENCOUNTER — Other Ambulatory Visit: Payer: Self-pay

## 2020-12-17 ENCOUNTER — Encounter (HOSPITAL_COMMUNITY)
Admission: RE | Admit: 2020-12-17 | Discharge: 2020-12-17 | Disposition: A | Discharge status: Home / Self Care | Payer: Self-pay | Source: Ambulatory Visit | Attending: Cardiology | Admitting: Cardiology

## 2020-12-18 DIAGNOSIS — G4733 Obstructive sleep apnea (adult) (pediatric): Secondary | ICD-10-CM | POA: Diagnosis not present

## 2020-12-19 ENCOUNTER — Telehealth: Payer: Self-pay | Admitting: Nurse Practitioner

## 2020-12-19 ENCOUNTER — Other Ambulatory Visit: Payer: Self-pay

## 2020-12-19 ENCOUNTER — Encounter (HOSPITAL_COMMUNITY)
Admission: RE | Admit: 2020-12-19 | Discharge: 2020-12-19 | Disposition: A | Discharge status: Home / Self Care | Payer: Self-pay | Source: Ambulatory Visit | Attending: Cardiology | Admitting: Cardiology

## 2020-12-19 NOTE — Telephone Encounter (Signed)
Copied from [place] [id number]. Topic: General - Other >> Dec 17, 2020  2:54 PM Yvette Rack wrote: Reason for CRM: Ervin with [employer] called for an update on whether the order for cpap supplies was signed by Geryl Rankins and prior authorization for cmn. Cb# [phone]

## 2020-12-20 NOTE — Telephone Encounter (Signed)
Will forward to provider  

## 2020-12-23 NOTE — Progress Notes (Signed)
HPI: Follow-up chest pain,chronicdiastolic congestive heart failure,atrial fibrillation,obesity and hypertension. Also with history of SVTand atrial fibrillation. Admitted with recurrent chest pain November 2019. Enzymes positive.Follow-up limited study November 2019 showed normal LV function and biatrial enlargement. CTA showed no pulmonary embolus. 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 Ejection fraction 65%. Left ventricular end-diastolic pressure 26 mmHg. Procedure complicated by ventricular fibrillation requiring defibrillation. This also resulted in reestablishing sinus rhythm as patient had been in atrial fibrillation previously. Developed contrast nephropathy following procedure.Last echocardiogram March 2021 showed vigorous LV function, moderately elevated pulmonary pressures, mild biatrial enlargement, mild mitral regurgitation.Since last seen, patient denies dyspnea, chest pain, palpitations or syncope.  Current Outpatient Medications  Medication Sig Dispense Refill  . Accu-Chek Softclix Lancets lancets USE AS DIRECTED TO TEST BLOOD SUGAR three times  DAILY E11.65 200 each 12  . albuterol (PROAIR HFA) 108 (90 Base) MCG/ACT inhaler INHALE 1 TO 2 PUFFS EVERY 6 HOURS AS NEEDED FOR WHEEZING/ SHORTNESS OF BREATH 8.5 g 2  . amLODipine (NORVASC) 10 MG tablet Take 1 tablet (10 mg total) by mouth daily. 90 tablet 1  . atorvastatin (LIPITOR) 40 MG tablet Take 1 tablet (40 mg total) by mouth daily at 6 PM. 90 tablet 2  . Blood Glucose Monitoring Suppl (ACCU-CHEK AVIVA PLUS) w/Device KIT 1 each by Does not apply route daily. USE AS DIRECTED TO TEST BLOOD SUGAR ONCE DAILY 1 kit 0  . carvedilol (COREG) 25 MG tablet Take 1 tablet (25 mg total) by mouth 2 (two) times daily with a meal. 180 tablet 1  . Dulaglutide (TRULICITY) 1.5 BV/6.7OL SOPN Inject  1.5 mg into the skin once a week. 2 mL 2  . escitalopram (LEXAPRO) 10 MG tablet Take 1 tablet (10 mg total) by mouth daily. 90 tablet 1  . ferrous sulfate (FEROSUL) 325 (65 FE) MG tablet Take 1 tablet (325 mg total) by mouth 2 (two) times daily. 180 tablet 1  . glipiZIDE (GLUCOTROL XL) 10 MG 24 hr tablet Take 1 tablet (10 mg total) by mouth every evening. 90 tablet 1  . Incontinence Supply Disposable (INCONTINENCE BRIEF LARGE) MISC Please provide patient with insurance approved incontinence supplies/briefs 18 each 6  . metFORMIN (GLUCOPHAGE) 1000 MG tablet Take 1 tablet (1,000 mg total) by mouth 2 (two) times daily with a meal. 60 tablet 2  . Misc. Devices MISC Please provide BiPAP machine with the following: Settings 22/18 cm H2O. Medium  size [employer] Full Face Mask Simplus mask and heated  humidification. 1 each 0  . Misc. Devices MISC Please provide patient a Blood Pressure Monitor w/ insurance approval. 1 Device 0  . oxybutynin (DITROPAN) 5 MG tablet Take 5 mg by mouth 3 (three) times daily.    . Potassium Chloride ER 20 MEQ TBCR Take 20 mEq by mouth 2 (two) times daily. 60 tablet 11  . risperidone (RISPERDAL) 4 MG tablet TAKE 1 TABLET (4 MG TOTAL) BY MOUTH DAILY. 90 tablet 1  . torsemide (DEMADEX) 20 MG tablet Take 4 tablets (80 mg total) by mouth 2 (two) times daily. 240 tablet 11  . Vitamin D, Ergocalciferol, (DRISDOL) 1.25 MG (50000 UNIT) CAPS capsule Take 1 capsule (50,000 Units total) by mouth every 7 (seven) days. 12 capsule 1  . XARELTO 20 MG TABS tablet TAKE 1 TABLET (20 MG TOTAL) BY MOUTH DAILY WITH SUPPER. 90 tablet  1  . Blood Pressure Monitor DEVI Please provide patient with insurance approved blood pressure monitor I10.0 1 each 0  . glucose blood (ACCU-CHEK AVIVA PLUS) test strip USE AS DIRECTED TO TEST BLOOD SUGAR three times  DAILY E11.65 (Patient not taking: Reported on 12/26/2020) 200 each 12   No current facility-administered medications for this visit.     Past  Medical History:  Diagnosis Date  . Acute on chronic diastolic congestive heart failure ([place]) 11/02/2013   10/03/2015, 11/13/2015, 08/03/2017  . Benign essential HTN 11/28/2013  . Bipolar disease, chronic ([place])   . Chest pain    a. 2012 Myoview: EF 63%, no isch/infarct;  b. 04/2016 Lexiscan MV: EF 73%, no ischemia/infarct-->Low risk.  . Chronic diastolic CHF (congestive heart failure) ([place]) 07/23/2011   a. 2015 Echo: EF 55-60%, Gr2 DD;  b. 09/2015 Echo: EF 60-65%, no rwma, mod dil LA, PASP 43mHg.  . Cor pulmonale (chronic) ([place]   . History of thyrotoxicosis   . HTN (hypertension) 11/28/2013  . Hypertensive heart disease 10/18/2013  . Hypoglycemia   . Insulin dependent type 2 diabetes mellitus, uncontrolled ([place]   . Mediastinal adenopathy   . Morbid obesity due to excess calories ([place] 02/19/2011  . Morbid obesity with BMI of 50.0-59.9, adult ([place]   . OSA (obstructive sleep apnea) 03/06/2011  . Persistent atrial fibrillation ([place] 12/09/2017  . Pulmonary HTN, moderate to severe 11/03/2013  . Sinusitis, chronic 01/02/2015  . SVT (supraventricular tachycardia) ([place] 12/06/2013  . Uncontrolled type 2 diabetes mellitus with hyperglycemia ([facility]     Past Surgical History:  Procedure Laterality Date  . CARDIOVERSION N/A 04/05/2018   Procedure: CARDIOVERSION;  Surgeon: CLelon Perla MD;  [facility];  Service: Cardiovascular;  Laterality: N/A;  . COLONOSCOPY WITH PROPOFOL Left 07/16/2018   Procedure: COLONOSCOPY WITH PROPOFOL;  Surgeon: KRonnette Juniper MD;  [facility] END[facility]COPY;  Service: Gastroenterology;  Laterality: Left;  . LEFT HEART CATH AND CORONARY ANGIOGRAPHY N/A 08/04/2018   Procedure: LEFT HEART CATH AND CORONARY ANGIOGRAPHY;  Surgeon: SBelva Crome MD;  [facility];  Service: Cardiovascular;  Laterality: N/A;  . None    . POLYPECTOMY  07/16/2018   Procedure: POLYPECTOMY;  Surgeon: KRonnette Juniper MD;  [facility];  Service: Gastroenterology;;     Social History   Socioeconomic History  . Marital status: Single    Spouse name: Not on file  . Number of children: 0  . Years of education: 156 . Highest education level: Not on file  Occupational History  . Occupation: unemployed  Tobacco Use  . Smoking status: Never Smoker  . Smokeless tobacco: Never Used  Vaping Use  . Vaping Use: Never used  Substance and Sexual Activity  . Alcohol use: No  . Drug use: No  . Sexual activity: Not Currently    Birth control/protection: None  Other Topics Concern  . 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 But ran out of money and is no longer attending school. (Note patient has bipolar disorder).      [place]       Primary language is Arabic. Lives with mother and brother.   Social Determinants of Health   Financial Resource Strain: Not on file  Food Insecurity: Not on file  Transportation  Needs: Not on file  Physical Activity: Not on file  Stress: Not on file  Social Connections: Not on file  Intimate Partner Violence: Not on file    Family History  Problem Relation Age of Onset  . Heart failure Father   . Stroke Father   . Hypertension Mother   . Heart disease Maternal Grandfather     ROS: no fevers or chills, productive cough, hemoptysis, dysphasia, odynophagia, melena, hematochezia, dysuria, hematuria, rash, seizure activity, orthopnea, PND, pedal edema, claudication. Remaining systems are negative.  Physical Exam: Well-developed obese in no acute distress.  Skin is warm and dry.  HEENT is normal.  Neck is supple.  Chest is clear to auscultation with normal expansion.  Cardiovascular exam is irregular Abdominal exam nontender or distended. No masses palpated. Extremities show no edema. neuro grossly intact  ECG-atrial fibrillation at a rate of 98,  septal infarct.  Low voltage.  Personally reviewed  A/P  1 persistent atrial fibrillation-continue carvedilol and Xarelto.  Patient is essentially asymptomatic.  I therefore feel that rate control and anticoagulation long-term his best option.  2 acute on chronic diastolic congestive heart failure-symptoms are much improved.  We discussed fluid restriction and low-sodium diet.  Continue Demadex at present dose.  Check potassium and renal function.  3 hypertension-patient's blood pressure is controlled.  Continue present medications.  4 chronic chest pain-symptoms are unchanged.  Will not pursue further ischemia evaluation.  Note electrocardiogram shows no ST changes.  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  6 morbid obesity-we discussed weight loss today.  [name], MD

## 2020-12-24 ENCOUNTER — Encounter (HOSPITAL_COMMUNITY): Payer: Self-pay

## 2020-12-26 ENCOUNTER — Ambulatory Visit (INDEPENDENT_AMBULATORY_CARE_PROVIDER_SITE_OTHER): Payer: Medicaid Other | Admitting: Cardiology

## 2020-12-26 ENCOUNTER — Encounter (HOSPITAL_COMMUNITY): Admission: RE | Admit: 2020-12-26 | Payer: Self-pay | Source: Ambulatory Visit

## 2020-12-26 ENCOUNTER — Encounter: Payer: Self-pay | Admitting: Cardiology

## 2020-12-26 ENCOUNTER — Other Ambulatory Visit: Payer: Self-pay

## 2020-12-26 VITALS — BP 117/77 | HR 98 | Ht 67.0 in | Wt 341.4 lb

## 2020-12-26 DIAGNOSIS — R072 Precordial pain: Secondary | ICD-10-CM

## 2020-12-26 DIAGNOSIS — I4819 Other persistent atrial fibrillation: Secondary | ICD-10-CM

## 2020-12-26 DIAGNOSIS — I10 Essential (primary) hypertension: Secondary | ICD-10-CM

## 2020-12-26 DIAGNOSIS — I5033 Acute on chronic diastolic (congestive) heart failure: Secondary | ICD-10-CM | POA: Diagnosis not present

## 2020-12-26 NOTE — Patient Instructions (Signed)
  Follow-Up: At [facility], you and your health needs are our priority.  As part of our continuing mission to provide you with exceptional heart care, we have created designated Provider Care Teams.  These Care Teams include your primary Cardiologist (physician) and Advanced Practice Providers (APPs -  Physician Assistants and Nurse Practitioners) who all work together to provide you with the care you need, when you need it.  We recommend signing up for the patient portal called "MyChart".  Sign up information is provided on this After Visit Summary.  MyChart is used to connect with patients for Virtual Visits (Telemedicine).  Patients are able to view lab/test results, encounter notes, upcoming appointments, etc.  Non-urgent messages can be sent to your provider as well.   To learn more about what you can do with MyChart, go to [email or web address].    Your next appointment:   6 month(s)  The format for your next appointment:   In Person  Provider:   You will see one of the following Advanced Practice Providers on your designated Care Team:    Sande Rives, PA-C  Coletta Memos, FNP  Then, [name], MD will plan to see you again in 12 month(s).

## 2020-12-27 ENCOUNTER — Encounter: Payer: Self-pay | Admitting: *Deleted

## 2020-12-27 DIAGNOSIS — I2781 Cor pulmonale (chronic): Secondary | ICD-10-CM | POA: Diagnosis not present

## 2020-12-27 DIAGNOSIS — J849 Interstitial pulmonary disease, unspecified: Secondary | ICD-10-CM | POA: Diagnosis not present

## 2020-12-27 DIAGNOSIS — G4733 Obstructive sleep apnea (adult) (pediatric): Secondary | ICD-10-CM | POA: Diagnosis not present

## 2020-12-27 LAB — BASIC METABOLIC PANEL
BUN/Creatinine Ratio: 18 (ref 9–23)
BUN: 19 mg/dL (ref 6–24)
CO2: 24 mmol/L (ref 20–29)
Calcium: 9 mg/dL (ref 8.7–10.2)
Chloride: 101 mmol/L (ref 96–106)
Creatinine, Ser: 1.03 mg/dL — ABNORMAL HIGH (ref 0.57–1.00)
Glucose: 200 mg/dL — ABNORMAL HIGH (ref 65–99)
Potassium: 4.3 mmol/L (ref 3.5–5.2)
Sodium: 142 mmol/L (ref 134–144)
eGFR: 65 mL/min/{1.73_m2} (ref >59)

## 2020-12-28 ENCOUNTER — Other Ambulatory Visit: Payer: Self-pay

## 2020-12-31 ENCOUNTER — Encounter (HOSPITAL_COMMUNITY)
Admission: RE | Admit: 2020-12-31 | Discharge: 2020-12-31 | Disposition: A | Discharge status: Home / Self Care | Payer: Medicaid Other | Source: Ambulatory Visit | Attending: Cardiology | Admitting: Cardiology

## 2020-12-31 ENCOUNTER — Other Ambulatory Visit: Payer: Self-pay

## 2020-12-31 DIAGNOSIS — I5033 Acute on chronic diastolic (congestive) heart failure: Secondary | ICD-10-CM | POA: Insufficient documentation

## 2020-12-31 NOTE — Progress Notes (Signed)
[name] in today for Pulmonary Rehab maintenance for exercise.  [name] heard hollering in pain when she moves her right arm.  [name] right wrist is swollen, red, warm to touch and painful with movement. [name] rates this as a "9".  This is new for her and she has never had any problems with her wrist or any joints in her body.  [name] has had this pain for 3 days. `Called and was fortunate to get an appointment for her to be seen by the primary provider colleague.  [name] has an appointment on tomorrow at 4:00 at [facility].  Pt advised of the appointment time.  Advised that during the meantime she could apply ice off and on.  [name] is opposed to taking mild analgesics.  Verbalized understanding. Cherre Huger, BSN Cardiac and Training and development officer

## 2021-01-01 ENCOUNTER — Other Ambulatory Visit: Payer: Self-pay

## 2021-01-01 ENCOUNTER — Ambulatory Visit: Payer: Medicaid Other | Attending: Physician Assistant | Admitting: Physician Assistant

## 2021-01-01 DIAGNOSIS — E1165 Type 2 diabetes mellitus with hyperglycemia: Secondary | ICD-10-CM | POA: Diagnosis not present

## 2021-01-01 DIAGNOSIS — M79644 Pain in right finger(s): Secondary | ICD-10-CM | POA: Diagnosis not present

## 2021-01-01 MED ORDER — KETOROLAC TROMETHAMINE 10 MG PO TABS
10.0000 mg | ORAL_TABLET | Freq: Four times a day (QID) | ORAL | 0 refills | Status: DC | PRN
  Filled 2021-01-01: qty 20, 5d supply, fill #0

## 2021-01-01 NOTE — Progress Notes (Signed)
Virtual Visit via Telephone Note  I connected with [name] on 01/01/21 at  4:10 PM EDT by telephone and verified that I am speaking with the correct person using two identifiers.  Location: Patient: home Provider: [facility] office   I discussed the limitations, risks, security and privacy concerns of performing an evaluation and management service by telephone and the availability of in person appointments. I also discussed with the patient that there may be a patient responsible charge related to this service. The patient expressed understanding and agreed to proceed.   History of Present Illness:  R wrist/thumb is painful red and swollen.  No fever.  NKI.  No h/o gout.     Observations/Objective:  NAD.  A&Ox3   Assessment and Plan: 1. Thumb pain, right - ketorolac (TORADOL) 10 MG tablet; Take 1 tablet (10 mg total) by mouth every 6 (six) hours as needed. Prn pain  Dispense: 20 tablet; Refill: 0 (short term; bleeding precautions given) - Uric Acid; Future  2. Uncontrolled type 2 diabetes mellitus with hyperglycemia (HCC) Continue current regimen.  Drink 80-100 ounces water daily.  Keep checking blood sugars and work on diet.      Follow Up Instructions: See PCP in 3-4 weeks for DM/htn and recheck thumb   I discussed the assessment and treatment plan with the patient. The patient was provided an opportunity to ask questions and all were answered. The patient agreed with the plan and demonstrated an understanding of the instructions.   The patient was advised to call back or seek an in-person evaluation if the symptoms worsen or if the condition fails to improve as anticipated.  I provided 14 minutes of non-face-to-face time during this encounter.   Freeman Caldron, PA-C  Patient ID: [name], female   DOB: [birth year], 55 y.o.   MRN: [id number]

## 2021-01-02 ENCOUNTER — Encounter (HOSPITAL_COMMUNITY): Admission: RE | Admit: 2021-01-02 | Payer: Medicaid Other | Source: Ambulatory Visit

## 2021-01-02 ENCOUNTER — Other Ambulatory Visit: Payer: Self-pay

## 2021-01-06 ENCOUNTER — Other Ambulatory Visit: Payer: Self-pay

## 2021-01-06 MED FILL — Metformin HCl Tab 1000 MG: ORAL | 30 days supply | Qty: 60 | Fill #0 | Status: AC

## 2021-01-06 MED FILL — Amlodipine Besylate Tab 10 MG (Base Equivalent): ORAL | 90 days supply | Qty: 90 | Fill #0 | Status: AC

## 2021-01-06 MED FILL — Potassium Chloride Tab ER 20 mEq (1500 MG): ORAL | 30 days supply | Qty: 60 | Fill #0 | Status: AC

## 2021-01-06 MED FILL — Rivaroxaban Tab 20 MG: ORAL | 30 days supply | Qty: 30 | Fill #0 | Status: AC

## 2021-01-06 NOTE — Telephone Encounter (Signed)
[name] refax a form on 01-01-2021 requesting prescription for  cpap supplies and he will refax again today

## 2021-01-07 ENCOUNTER — Encounter (HOSPITAL_COMMUNITY)
Admission: RE | Admit: 2021-01-07 | Discharge: 2021-01-07 | Disposition: A | Discharge status: Home / Self Care | Payer: Medicaid Other | Source: Ambulatory Visit | Attending: Cardiology | Admitting: Cardiology

## 2021-01-07 ENCOUNTER — Other Ambulatory Visit: Payer: Self-pay

## 2021-01-08 NOTE — Telephone Encounter (Signed)
Received fax will have. [name] has signed form and will fax back

## 2021-01-09 ENCOUNTER — Encounter (HOSPITAL_COMMUNITY)
Admission: RE | Admit: 2021-01-09 | Discharge: 2021-01-09 | Disposition: A | Discharge status: Home / Self Care | Payer: Medicaid Other | Source: Ambulatory Visit | Attending: Cardiology | Admitting: Cardiology

## 2021-01-09 ENCOUNTER — Other Ambulatory Visit: Payer: Self-pay

## 2021-01-14 ENCOUNTER — Other Ambulatory Visit: Payer: Self-pay

## 2021-01-14 ENCOUNTER — Encounter (HOSPITAL_COMMUNITY)
Admission: RE | Admit: 2021-01-14 | Discharge: 2021-01-14 | Disposition: A | Discharge status: Home / Self Care | Payer: Medicaid Other | Source: Ambulatory Visit | Attending: Cardiology | Admitting: Cardiology

## 2021-01-16 ENCOUNTER — Other Ambulatory Visit: Payer: Self-pay

## 2021-01-16 ENCOUNTER — Encounter (HOSPITAL_COMMUNITY): Payer: Medicaid Other

## 2021-01-17 ENCOUNTER — Other Ambulatory Visit: Payer: Self-pay

## 2021-01-17 ENCOUNTER — Other Ambulatory Visit: Payer: Self-pay | Admitting: *Deleted

## 2021-01-17 ENCOUNTER — Other Ambulatory Visit: Payer: Self-pay | Admitting: Nurse Practitioner

## 2021-01-17 DIAGNOSIS — R5383 Other fatigue: Secondary | ICD-10-CM

## 2021-01-17 DIAGNOSIS — D649 Anemia, unspecified: Secondary | ICD-10-CM

## 2021-01-17 MED ORDER — FERROUS SULFATE 325 (65 FE) MG PO TABS
ORAL_TABLET | ORAL | 0 refills | Status: DC
  Filled 2021-01-17: qty 60, 30d supply, fill #0
  Filled 2021-02-05: qty 60, 30d supply, fill #1

## 2021-01-17 MED FILL — Torsemide Tab 20 MG: ORAL | 30 days supply | Qty: 240 | Fill #0 | Status: AC

## 2021-01-17 NOTE — Patient Instructions (Signed)
Visit Information  Ms. SXJDB ZMCEYEM  - as a part of your Medicaid benefit, you are eligible for care management and care coordination services at no cost or copay. I was unable to reach you by phone today but would be happy to help you with your health related needs. Please feel free to call me @ [phone].   A member of the Managed Medicaid care management team will reach out to you again over the next 7-14 days.   Lurena Joiner RN, BSN [place]  [employer] manager

## 2021-01-17 NOTE — Patient Outreach (Signed)
Care Coordination  01/17/2021  [name] Sep 06, 1966 [id number]    Medicaid Managed Care   Unsuccessful Outreach Note  01/17/2021 Name: [name] MRN: [id number] DOB: [birth year]  Referred by: [name], NP Reason for referral : High Risk Managed Medicaid (Unsuccessful RNCM initial outreach)   An unsuccessful telephone outreach was attempted today. The patient was referred to the case management team for assistance with care management and care coordination.   Follow Up Plan: A HIPAA compliant phone message was left for the patient providing contact information and requesting a return call.  The care management team will reach out to the patient again over the next 7-14 days.   Lurena Joiner RN, BSN [place]  [employer] manager

## 2021-01-20 ENCOUNTER — Telehealth: Payer: Self-pay | Admitting: Nurse Practitioner

## 2021-01-20 NOTE — Telephone Encounter (Signed)
Pt need to schedule app with [name] for DM/HTN and recheck her thumb. Called Pt no answer left vm for Pt to call [phone] to schedule appt.

## 2021-01-20 NOTE — Telephone Encounter (Signed)
-----   Message from Argentina Donovan, [place] sent at 01/01/2021  3:56 PM EDT ----- See PCP in 3-4 weeks for DM/htn and recheck thumb

## 2021-01-21 ENCOUNTER — Encounter (HOSPITAL_COMMUNITY)
Admission: RE | Admit: 2021-01-21 | Discharge: 2021-01-21 | Disposition: A | Discharge status: Home / Self Care | Payer: Medicaid Other | Source: Ambulatory Visit | Attending: Cardiology | Admitting: Cardiology

## 2021-01-21 ENCOUNTER — Other Ambulatory Visit: Payer: Self-pay

## 2021-01-21 NOTE — Progress Notes (Signed)
Pulmonary Rehab Maintenance [name] showed up for exercise today complaining that she was having joint pain all over. She said that she did not come to exercise last Thursday due to this. She also states that she feels exhausted.[name] denies any swelling in her joints that she can identify. [facility] which is who provides her primary care. They said that the only way she could be seen today was at their mobile unit. We obtained the location and hours of operation for today. I wrote the address down for her. She plans to go when she leaves from exercise. She was only able to exercise today on the nu step for 10 minutes, but most of that time was using only her arms. She does many rest stops on the nu step.

## 2021-01-23 ENCOUNTER — Other Ambulatory Visit: Payer: Self-pay

## 2021-01-23 ENCOUNTER — Encounter (HOSPITAL_COMMUNITY)
Admission: RE | Admit: 2021-01-23 | Discharge: 2021-01-23 | Disposition: A | Discharge status: Home / Self Care | Payer: Medicaid Other | Source: Ambulatory Visit | Attending: Cardiology | Admitting: Cardiology

## 2021-01-23 DIAGNOSIS — N3946 Mixed incontinence: Secondary | ICD-10-CM | POA: Diagnosis not present

## 2021-01-23 NOTE — Progress Notes (Signed)
Pulmonary Rehab [name] has finished her 9 weeks in Pulmonary Rehab today. Unfortunately she needed many reminders with every exercise session. She came in to each day to exercise without a mask and had to be told to replace her mask after drinking water many time, did not consistently check her blood sugars before or after exercise nor did she eat breakfast as I had ask her to do before she came in. Her exercise was using the nu step and walking the track. On the nu step she never progressed past level 1 nor past 10 minutes on this piece of equipment. When she was on the nu step she would mostly use just her arm and did use the feet pedal to move her legs. She would not consistently walk the track and when she did it did not progress past 5 laps. She complained of joint pain a lot. Each time I ask er if she was exercising at home using her bands or doing any exercise she stated "no".  I had ask her to do these on at least some of the days she did not come here to exercise. Her plan for exercise moving forward is to use the gym at her apartment complex. I gave her information on [place] but she said that she was going to use her gym.Unfortunately I do not feel that she made any progress in her journey to improving her deconditioning or her SOB. Her oxygen saturations were 90-96% on room air.

## 2021-01-26 DIAGNOSIS — J849 Interstitial pulmonary disease, unspecified: Secondary | ICD-10-CM | POA: Diagnosis not present

## 2021-01-26 DIAGNOSIS — I2781 Cor pulmonale (chronic): Secondary | ICD-10-CM | POA: Diagnosis not present

## 2021-01-31 ENCOUNTER — Other Ambulatory Visit: Payer: Self-pay

## 2021-02-05 ENCOUNTER — Other Ambulatory Visit: Payer: Self-pay | Admitting: Nurse Practitioner

## 2021-02-05 ENCOUNTER — Other Ambulatory Visit: Payer: Self-pay

## 2021-02-05 DIAGNOSIS — I110 Hypertensive heart disease with heart failure: Secondary | ICD-10-CM

## 2021-02-05 DIAGNOSIS — I5032 Chronic diastolic (congestive) heart failure: Secondary | ICD-10-CM

## 2021-02-05 MED ORDER — CARVEDILOL 25 MG PO TABS
ORAL_TABLET | Freq: Two times a day (BID) | ORAL | 0 refills | Status: DC
  Filled 2021-02-05: qty 180, 90d supply, fill #0

## 2021-02-05 MED FILL — Metformin HCl Tab 1000 MG: ORAL | 30 days supply | Qty: 60 | Fill #1 | Status: AC

## 2021-02-05 MED FILL — Risperidone Tab 4 MG: ORAL | 90 days supply | Qty: 90 | Fill #0 | Status: AC

## 2021-02-05 MED FILL — Rivaroxaban Tab 20 MG: ORAL | 30 days supply | Qty: 30 | Fill #1 | Status: AC

## 2021-02-05 MED FILL — Glipizide Tab ER 24HR 10 MG: ORAL | 90 days supply | Qty: 90 | Fill #0 | Status: AC

## 2021-02-05 MED FILL — Potassium Chloride Tab ER 20 mEq (1500 MG): ORAL | 30 days supply | Qty: 60 | Fill #1 | Status: AC

## 2021-02-05 MED FILL — Torsemide Tab 20 MG: ORAL | 30 days supply | Qty: 240 | Fill #1 | Status: CN

## 2021-02-06 ENCOUNTER — Other Ambulatory Visit: Payer: Self-pay

## 2021-02-07 ENCOUNTER — Other Ambulatory Visit: Payer: Self-pay

## 2021-02-21 ENCOUNTER — Other Ambulatory Visit: Payer: Self-pay

## 2021-02-21 MED FILL — Torsemide Tab 20 MG: ORAL | 90 days supply | Qty: 720 | Fill #1 | Status: AC

## 2021-02-25 ENCOUNTER — Other Ambulatory Visit: Payer: Self-pay

## 2021-02-26 DIAGNOSIS — I2781 Cor pulmonale (chronic): Secondary | ICD-10-CM | POA: Diagnosis not present

## 2021-02-26 DIAGNOSIS — J849 Interstitial pulmonary disease, unspecified: Secondary | ICD-10-CM | POA: Diagnosis not present

## 2021-03-18 ENCOUNTER — Other Ambulatory Visit: Payer: Self-pay

## 2021-03-18 MED FILL — Rivaroxaban Tab 20 MG: ORAL | 30 days supply | Qty: 30 | Fill #2 | Status: CN

## 2021-03-18 MED FILL — Atorvastatin Calcium Tab 40 MG (Base Equivalent): ORAL | 90 days supply | Qty: 90 | Fill #0 | Status: AC

## 2021-03-20 ENCOUNTER — Other Ambulatory Visit: Payer: Self-pay

## 2021-03-20 MED FILL — Rivaroxaban Tab 20 MG: ORAL | 30 days supply | Qty: 30 | Fill #2 | Status: AC

## 2021-03-28 DIAGNOSIS — I2781 Cor pulmonale (chronic): Secondary | ICD-10-CM | POA: Diagnosis not present

## 2021-03-28 DIAGNOSIS — J849 Interstitial pulmonary disease, unspecified: Secondary | ICD-10-CM | POA: Diagnosis not present

## 2021-03-29 ENCOUNTER — Emergency Department (HOSPITAL_COMMUNITY)
Admission: EM | Admit: 2021-03-29 | Discharge: 2021-03-29 | Disposition: A | Discharge status: Home / Self Care | Payer: Medicaid Other | Attending: Emergency Medicine | Admitting: Emergency Medicine

## 2021-03-29 ENCOUNTER — Other Ambulatory Visit: Payer: Self-pay

## 2021-03-29 ENCOUNTER — Emergency Department (HOSPITAL_COMMUNITY): Payer: Medicaid Other

## 2021-03-29 DIAGNOSIS — Z20822 Contact with and (suspected) exposure to covid-19: Secondary | ICD-10-CM | POA: Diagnosis not present

## 2021-03-29 DIAGNOSIS — Z79899 Other long term (current) drug therapy: Secondary | ICD-10-CM | POA: Insufficient documentation

## 2021-03-29 DIAGNOSIS — Z7901 Long term (current) use of anticoagulants: Secondary | ICD-10-CM | POA: Insufficient documentation

## 2021-03-29 DIAGNOSIS — J181 Lobar pneumonia, unspecified organism: Secondary | ICD-10-CM | POA: Diagnosis not present

## 2021-03-29 DIAGNOSIS — Z955 Presence of coronary angioplasty implant and graft: Secondary | ICD-10-CM | POA: Insufficient documentation

## 2021-03-29 DIAGNOSIS — Z7984 Long term (current) use of oral hypoglycemic drugs: Secondary | ICD-10-CM | POA: Diagnosis not present

## 2021-03-29 DIAGNOSIS — R0602 Shortness of breath: Secondary | ICD-10-CM | POA: Diagnosis not present

## 2021-03-29 DIAGNOSIS — J189 Pneumonia, unspecified organism: Secondary | ICD-10-CM

## 2021-03-29 DIAGNOSIS — I509 Heart failure, unspecified: Secondary | ICD-10-CM | POA: Diagnosis not present

## 2021-03-29 DIAGNOSIS — Z794 Long term (current) use of insulin: Secondary | ICD-10-CM | POA: Diagnosis not present

## 2021-03-29 DIAGNOSIS — I5033 Acute on chronic diastolic (congestive) heart failure: Secondary | ICD-10-CM | POA: Diagnosis not present

## 2021-03-29 DIAGNOSIS — J811 Chronic pulmonary edema: Secondary | ICD-10-CM | POA: Diagnosis not present

## 2021-03-29 DIAGNOSIS — R0902 Hypoxemia: Secondary | ICD-10-CM | POA: Diagnosis not present

## 2021-03-29 DIAGNOSIS — I4891 Unspecified atrial fibrillation: Secondary | ICD-10-CM | POA: Diagnosis not present

## 2021-03-29 DIAGNOSIS — E119 Type 2 diabetes mellitus without complications: Secondary | ICD-10-CM | POA: Diagnosis not present

## 2021-03-29 DIAGNOSIS — R079 Chest pain, unspecified: Secondary | ICD-10-CM | POA: Diagnosis not present

## 2021-03-29 DIAGNOSIS — I110 Hypertensive heart disease with heart failure: Secondary | ICD-10-CM | POA: Insufficient documentation

## 2021-03-29 DIAGNOSIS — R739 Hyperglycemia, unspecified: Secondary | ICD-10-CM | POA: Diagnosis not present

## 2021-03-29 DIAGNOSIS — R0789 Other chest pain: Secondary | ICD-10-CM | POA: Diagnosis not present

## 2021-03-29 DIAGNOSIS — I517 Cardiomegaly: Secondary | ICD-10-CM | POA: Diagnosis not present

## 2021-03-29 LAB — CBC WITH DIFFERENTIAL/PLATELET
Abs Immature Granulocytes: 0.02 10*3/uL (ref 0.00–0.07)
Basophils Absolute: 0 10*3/uL (ref 0.0–0.1)
Basophils Relative: 1 %
Eosinophils Absolute: 0.1 10*3/uL (ref 0.0–0.5)
Eosinophils Relative: 1 %
HCT: 36.6 % (ref 36.0–46.0)
Hemoglobin: 10.8 g/dL — ABNORMAL LOW (ref 12.0–15.0)
Immature Granulocytes: 0 %
Lymphocytes Relative: 20 %
Lymphs Abs: 1 10*3/uL (ref 0.7–4.0)
MCH: 26 pg (ref 26.0–34.0)
MCHC: 29.5 g/dL — ABNORMAL LOW (ref 30.0–36.0)
MCV: 88.2 fL (ref 80.0–100.0)
Monocytes Absolute: 0.5 10*3/uL (ref 0.1–1.0)
Monocytes Relative: 10 %
Neutro Abs: 3.4 10*3/uL (ref 1.7–7.7)
Neutrophils Relative %: 68 %
Platelets: 185 10*3/uL (ref 150–400)
RBC: 4.15 MIL/uL (ref 3.87–5.11)
RDW: 15.5 % (ref 11.5–15.5)
WBC: 5 10*3/uL (ref 4.0–10.5)
nRBC: 0 % (ref 0.0–0.2)

## 2021-03-29 LAB — TROPONIN I (HIGH SENSITIVITY)
Troponin I (High Sensitivity): 4 ng/L (ref <18)
Troponin I (High Sensitivity): 5 ng/L (ref <18)

## 2021-03-29 LAB — RESP PANEL BY RT-PCR (FLU A&B, COVID) ARPGX2
Influenza A by PCR: NEGATIVE
Influenza B by PCR: NEGATIVE
SARS Coronavirus 2 by RT PCR: NEGATIVE

## 2021-03-29 LAB — COMPREHENSIVE METABOLIC PANEL
ALT: 12 U/L (ref 0–44)
AST: 20 U/L (ref 15–41)
Albumin: 3.3 g/dL — ABNORMAL LOW (ref 3.5–5.0)
Alkaline Phosphatase: 106 U/L (ref 38–126)
Anion gap: 8 (ref 5–15)
BUN: 16 mg/dL (ref 6–20)
CO2: 26 mmol/L (ref 22–32)
Calcium: 9.2 mg/dL (ref 8.9–10.3)
Chloride: 103 mmol/L (ref 98–111)
Creatinine, Ser: 0.93 mg/dL (ref 0.44–1.00)
GFR, Estimated: >60 mL/min (ref >60)
Glucose, Bld: 292 mg/dL — ABNORMAL HIGH (ref 70–99)
Potassium: 3.6 mmol/L (ref 3.5–5.1)
Sodium: 137 mmol/L (ref 135–145)
Total Bilirubin: 0.9 mg/dL (ref 0.3–1.2)
Total Protein: 7.1 g/dL (ref 6.5–8.1)

## 2021-03-29 LAB — BRAIN NATRIURETIC PEPTIDE: B Natriuretic Peptide: 262.1 pg/mL — ABNORMAL HIGH (ref 0.0–100.0)

## 2021-03-29 MED ORDER — AZITHROMYCIN 250 MG PO TABS: ORAL_TABLET | ORAL | 0 refills | Status: AC

## 2021-03-29 MED ORDER — SODIUM CHLORIDE 0.9 % IV SOLN
500.0000 mg | Freq: Once | INTRAVENOUS | Status: AC
  Administered 2021-03-29: 500 mg via INTRAVENOUS
  Filled 2021-03-29: qty 500

## 2021-03-29 MED ORDER — CEPHALEXIN 500 MG PO CAPS: 500.0000 mg | ORAL_CAPSULE | Freq: Three times a day (TID) | ORAL | 0 refills | Status: AC

## 2021-03-29 MED ORDER — SODIUM CHLORIDE 0.9 % IV SOLN
1.0000 g | Freq: Once | INTRAVENOUS | Status: AC
  Administered 2021-03-29: 1 g via INTRAVENOUS
  Filled 2021-03-29: qty 10

## 2021-03-29 MED ORDER — FUROSEMIDE 10 MG/ML IJ SOLN
40.0000 mg | Freq: Once | INTRAMUSCULAR | Status: AC
  Administered 2021-03-29: 40 mg via INTRAVENOUS
  Filled 2021-03-29: qty 4

## 2021-03-29 NOTE — ED Provider Notes (Signed)
[facility] EMERGENCY DEPARTMENT Provider Note   CSN: [id number] Arrival date & time: 03/29/21  1314     History No chief complaint on file.   [name] is a 55 y.o. female.  HPI 65yoF PMHx T2DM (metformin, glipizide), afib (xarelto), HTN, ACS, HFpEF, pHTN w/ chronic cor pulmonale, OSA, p/w chest pain since 1000 this morning while she was eating cookies and milk. Pain is mild-moderate severity, radiates to L shoulder, constant, improving, no improving/worsening factors. Pain is inconsistent w/ prior MI. Some associated SOB. On torsemide 83m BID, states she is adherent to regimen. No further medical concern at this time, including fevers, chills, rhinorrhea, sore throat, NVDC, abdominal pain, headache, audiovisual changes, focal paresthesias/weakness.    Past Medical History:  Diagnosis Date   Acute on chronic diastolic congestive heart failure (HPaauilo 11/02/2013   10/03/2015, 11/13/2015, 08/03/2017   Benign essential HTN 11/28/2013   Bipolar disease, chronic (HEvangeline    Chest pain    a. 2012 Myoview: EF 63%, no isch/infarct;  b. 04/2016 Lexiscan MV: EF 73%, no ischemia/infarct-->Low risk.   Chronic diastolic CHF (congestive heart failure) (HMorgan 07/23/2011   a. 2015 Echo: EF 55-60%, Gr2 DD;  b. 09/2015 Echo: EF 60-65%, no rwma, mod dil LA, PASP 521mg.   Cor pulmonale (chronic) (HCC)    History of thyrotoxicosis    HTN (hypertension) 11/28/2013   Hypertensive heart disease 10/18/2013   Hypoglycemia    Insulin dependent type 2 diabetes mellitus, uncontrolled (HCBella Villa   Mediastinal adenopathy    Morbid obesity due to excess calories (HCWoodlawn Park5/24/2012   Morbid obesity with BMI of 50.0-59.9, adult (HCC)    OSA (obstructive sleep apnea) 03/06/2011   Persistent atrial fibrillation (HCGrantfork03/14/2019   Pulmonary HTN, moderate to severe 11/03/2013   Sinusitis, chronic 01/02/2015   SVT (supraventricular tachycardia) (HCMunroe Falls3/07/2014   Uncontrolled type 2 diabetes mellitus with  hyperglycemia ([facility]    Patient Active Problem List   Diagnosis Date Noted   Obesity hypoventilation syndrome (HCEpps03/19/2021   Depression 12/15/2019   ILD (interstitial lung disease) (HCSilver City08/30/2020   Exertional angina (HCYale08/30/2020   AKI (acute kidney injury) (HCOldtown   Vitamin B12 deficiency 07/20/2018   Hematochezia 07/14/2018   Acute posthemorrhagic anemia 07/14/2018   GIB (gastrointestinal bleeding) 07/14/2018   Chronic anticoagulation 07/14/2018   Persistent atrial fibrillation    Uncontrolled type 2 diabetes mellitus with hyperglycemia (HCC)    Hypokalemia    Cor pulmonale (chronic) (HCC)    Mycobacterium avium complex (HCMonroe Center03/17/2017   Pyrexia    Dyspnea 11/13/2015   Mediastinal adenopathy 11/13/2015   Abnormal CT scan, chest 11/11/2015   Chest pain 11/11/2015   Essential hypertension 03/07/2015   Depression (emotion) 03/07/2015   Noninfectious gastroenteritis and colitis 01/02/2015   Sinusitis, chronic 01/02/2015   Midline low back pain without sciatica 09/10/2014   Bipolar 1 disorder, mixed, moderate (HCMineral Ridge10/01/2014   Stress incontinence 07/02/2014   Mania (HCCanyon City03/15/2015   Speech abnormality 12/08/2013   SVT (supraventricular tachycardia) (HCHastings03/07/2014   Benign essential HTN 11/28/2013   H/O non-insulin dependent diabetes mellitus 11/28/2013   Pulmonary HTN, moderate to severe 11/03/2013   Acute on chronic diastolic congestive heart failure (HCCandelaria Arenas02/01/2014   Hypertensive heart disease 10/18/2013   Non-insulin dependent type 2 diabetes mellitus (HCStockton01/21/2015   Chronic diastolic heart failure (HCTerra Alta10/25/2012   OSA (obstructive sleep apnea)- non compliant with C-pap 03/06/2011   Morbid obesity (HCWest New York05/24/2012   Non-ST elevation (  NSTEMI) myocardial infarction ([place]) 04/29/2009   Bipolar disorder     Past Surgical History:  Procedure Laterality Date   CARDIOVERSION N/A 04/05/2018   Procedure: CARDIOVERSION;  Surgeon: Lelon Perla, MD;   Location: [place];  Service: Cardiovascular;  Laterality: N/A;   COLONOSCOPY WITH PROPOFOL Left 07/16/2018   Procedure: COLONOSCOPY WITH PROPOFOL;  Surgeon: Ronnette Juniper, MD;  Location[facility];  Service: Gastroenterology;  Laterality: Left;   LEFT HEART CATH AND CORONARY ANGIOGRAPHY N/A 08/04/2018   Procedure: LEFT HEART CATH AND CORONARY ANGIOGRAPHY;  Surgeon: Belva Crome, MD;  Location: [facility];  Service: Cardiovascular;  Laterality: N/A;   None     POLYPECTOMY  07/16/2018   Procedure: POLYPECTOMY;  Surgeon: Ronnette Juniper, MD;  Location: [facility];  Service: Gastroenterology;;     OB History   No obstetric history on file.     Family History  Problem Relation Age of Onset   Heart failure Father    Stroke Father    Hypertension Mother    Heart disease Maternal Grandfather     Social History   Tobacco Use   Smoking status: Never   Smokeless tobacco: Never  Vaping Use   Vaping Use: Never used  Substance Use Topics   Alcohol use: No   Drug use: No    Home Medications Prior to Admission medications   Medication Sig Start Date End Date Taking? Authorizing Provider  albuterol (VENTOLIN HFA) 108 (90 Base) MCG/ACT inhaler INHALE 1 TO 2 PUFFS EVERY 6 HOURS AS NEEDED FOR WHEEZING/ SHORTNESS OF BREATH Patient taking differently: Inhale 1-2 puffs into the lungs every 6 (six) hours as needed for wheezing or shortness of breath. 11/13/20 11/13/21 Yes [name], NP  amLODipine (NORVASC) 10 MG tablet TAKE 1 TABLET (10 MG TOTAL) BY MOUTH DAILY. Patient taking differently: Take 10 mg by mouth daily. 07/02/20 07/02/21 Yes [name], NP  azithromycin (ZITHROMAX Z-PAK) 250 MG tablet Take 2 tablets (500 mg total) by mouth daily for 1 day, THEN 1 tablet (250 mg total) daily for 4 days. 03/29/21 04/03/21 Yes Kylyn Sookram, MD  carvedilol (COREG) 25 MG tablet TAKE 1 TABLET (25 MG TOTAL) BY MOUTH 2 (TWO) TIMES DAILY WITH A MEAL. Patient taking differently:  Take 25 mg by mouth 2 (two) times daily with a meal. 02/05/21 02/05/22 Yes [name], NP  cephALEXin (KEFLEX) 500 MG capsule Take 1 capsule (500 mg total) by mouth 3 (three) times daily for 5 days. 03/29/21 04/03/21 Yes Solei Wubben, MD  Dulaglutide 1.5 MG/0.5ML SOPN INJECT 1.5 MG INTO THE SKIN ONCE A WEEK. 10/31/20 10/31/21 Yes Newlin, Enobong, MD  escitalopram (LEXAPRO) 20 MG tablet TAKE 1 TABLET BY MOUTH EVERY MORNING Patient taking differently: Take 20 mg by mouth every morning. 09/02/20 09/02/21 Yes Scism, Dani Gobble, FNP  ferrous sulfate (FEROSUL) 325 (65 FE) MG tablet TAKE 1 TABLET (325 MG TOTAL) BY MOUTH 2 (TWO) TIMES DAILY. Patient taking differently: Take 325 mg by mouth daily. 01/17/21 01/17/22 Yes [name], NP  glipiZIDE (GLUCOTROL XL) 10 MG 24 hr tablet TAKE 1 TABLET (10 MG TOTAL) BY MOUTH EVERY EVENING. Patient taking differently: Take 10 mg by mouth daily with breakfast. 10/02/20 10/02/21 Yes [name], NP  metFORMIN (GLUCOPHAGE) 1000 MG tablet TAKE 1 TABLET (1,000 MG TOTAL) BY MOUTH 2 (TWO) TIMES DAILY WITH A MEAL. Patient taking differently: Take 1,000 mg by mouth 2 (two) times daily with a meal. 10/24/20 10/24/21 Yes Newlin,  Charlane Ferretti, MD  Naphazoline-Pheniramine (EYE ALLERGY RELIEF OP) Place 1 drop into both eyes daily as needed (allergy).   Yes [provider]  Potassium Chloride ER 20 MEQ TBCR TAKE 1 TABLET BY MOUTH TWICE DAILY. Patient taking differently: Take 20 tablets by mouth 2 (two) times daily. 07/02/20 07/02/21 Yes [name], NP  risperidone (RISPERDAL) 4 MG tablet TAKE 1 TABLET (4 MG TOTAL) BY MOUTH DAILY. Patient taking differently: Take 4 mg by mouth daily. 11/08/20 11/08/21 Yes Charlott Rakes, MD  rivaroxaban (XARELTO) 20 MG TABS tablet TAKE 1 TABLET (20 MG TOTAL) BY MOUTH DAILY WITH SUPPER. Patient taking differently: Take 20 mg by mouth daily with supper. 10/01/20 10/01/21 Yes [name], NP  torsemide (DEMADEX) 20 MG tablet TAKE 4 TABLETS  (80 MG TOTAL) BY MOUTH 2 (TWO) TIMES DAILY. Patient taking differently: Take 80 mg by mouth 2 (two) times daily. 08/07/20 08/07/21 Yes Lelon Perla, MD  Accu-Chek Softclix Lancets lancets USE AS DIRECTED TO TEST BLOOD SUGAR THREE TIMES DAILY 10/02/20 10/02/21  [name], NP  atorvastatin (LIPITOR) 40 MG tablet TAKE 1 TABLET (40 MG TOTAL) BY MOUTH DAILY AT 6 PM. Patient not taking: No sig reported 07/02/20 07/02/21  [name], NP  Blood Glucose Monitoring Suppl ([place]) w/Device KIT USE AS DIRECTED TO CHECK BLOOD SUGAR ONCE DAILY 10/02/20 10/02/21  [name], NP  Blood Pressure Monitor DEVI Please provide patient with insurance approved blood pressure monitor I10.0 07/21/20   [name], NP  escitalopram (LEXAPRO) 10 MG tablet TAKE 1 TABLET (10 MG TOTAL) BY MOUTH DAILY. Patient not taking: No sig reported 10/02/20 10/02/21  [name], NP  glucose blood test strip USE AS DIRECTED TO TEST BLOOD SUGAR THREE TIMES DAILY Patient not taking: No sig reported 10/02/20 10/02/21  [name], NP  glucose blood test strip USE AS DIRECTED TO TEST BLOOD SUGAR ONCE DAILY 10/02/20 10/02/21  [name], NP  Incontinence Supply Disposable (INCONTINENCE BRIEF LARGE) MISC Please provide patient with insurance approved incontinence supplies/briefs 07/02/20   [name], NP  ketorolac (TORADOL) 10 MG tablet Take 1 tablet (10 mg total) by mouth every 6 (six) hours as needed as needed for pain Patient not taking: No sig reported 01/01/21   Argentina Donovan, PA-C  Misc. Devices MISC Please provide BiPAP machine with the following: Settings 22/18 cm H2O. Medium  size [employer] Full Face Mask Simplus mask and heated  humidification. 02/28/20   [name], NP  Misc. Devices MISC Please provide patient a Blood Pressure Monitor w/ insurance approval. 07/18/20   [name], NP  oxybutynin (DITROPAN) 5 MG tablet Take 5 mg by mouth 3 (three) times daily.    [provider]  Vitamin D, Ergocalciferol, (DRISDOL) 1.25 MG (50000 UNIT) CAPS capsule Take 1 capsule (50,000 Units total) by mouth every 7 (seven) days. Patient not taking: No sig reported 03/19/20   [name], NP  furosemide (LASIX) 80 MG tablet Take 1 tablet (80 mg total) by mouth in the morning, at noon, and at bedtime. 12/19/19 12/30/19  Allie Bossier, MD  losartan (COZAAR) 25 MG tablet Take 0.5 tablets (12.5 mg total) by mouth daily. 07/09/20 08/07/20  [name], NP  sitaGLIPtin (JANUVIA) 100 MG tablet Take 1 tablet (100 mg total) by mouth daily. 10/02/20 10/24/20  [name], NP    Allergies    Acetaminophen, Caffeine, Farxiga [dapagliflozin], and  Lisinopril  Review of Systems   Review of Systems  Constitutional:  Negative for activity change, appetite change, chills and fever.  HENT:  Negative for rhinorrhea, sinus pain and sore throat.   Eyes:  Negative for photophobia, discharge and redness.  Respiratory:  Positive for shortness of breath. Negative for cough and wheezing.   Cardiovascular:  Positive for chest pain and leg swelling. Negative for palpitations.  Gastrointestinal:  Negative for abdominal distention, abdominal pain, diarrhea, nausea and vomiting.  Genitourinary:  Negative for decreased urine volume and dysuria.  Musculoskeletal:  Negative for back pain and neck pain.  Skin:  Negative for rash and wound.  Neurological:  Positive for light-headedness. Negative for seizures, syncope, speech difficulty, weakness and headaches.  Psychiatric/Behavioral:  Negative for agitation and confusion.    Physical Exam Updated Vital Signs BP (!) 170/112 (BP Location: Right Wrist)   Pulse 96   Temp 97.8 F (36.6 C) (Oral)   Resp (!) 23   Ht _0  (1.702 m)   Wt (!) 151 kg   SpO2 96%   BMI 52.16 kg/m   Physical Exam Vitals and nursing note reviewed.  Constitutional:      General: She is not in acute distress.    Appearance: She is not toxic-appearing.   HENT:     Head: Normocephalic and atraumatic.     Right Ear: External ear normal.     Left Ear: External ear normal.     Nose: Nose normal.     Mouth/Throat:     Mouth: Mucous membranes are moist.     Pharynx: Oropharynx is clear.  Eyes:     Extraocular Movements: Extraocular movements intact.     Conjunctiva/sclera: Conjunctivae normal.     Pupils: Pupils are equal, round, and reactive to light.  Cardiovascular:     Rate and Rhythm: Normal rate. Rhythm irregular.     Pulses: Normal pulses.     Heart sounds: No murmur heard.   No friction rub. No gallop.  Pulmonary:     Breath sounds: No stridor. No wheezing, rhonchi or rales.  Abdominal:     General: There is no distension.     Tenderness: There is no abdominal tenderness. There is no guarding or rebound.  Musculoskeletal:        General: No deformity or signs of injury. Normal range of motion.     Cervical back: Normal range of motion. No rigidity or tenderness.     Right lower leg: Edema present.     Left lower leg: Edema present.  Skin:    General: Skin is warm and dry.  Neurological:     General: No focal deficit present.     Mental Status: She is alert and oriented to person, place, and time. Mental status is at baseline.  Psychiatric:        Mood and Affect: Mood normal.        Behavior: Behavior normal.    ED Results / Procedures / Treatments   Labs (all labs ordered are listed, but only abnormal results are displayed) Labs Reviewed  COMPREHENSIVE METABOLIC PANEL - Abnormal; Notable for the following components:      Result Value   Glucose, Bld 292 (*)    Albumin 3.3 (*)    All other components within normal limits  CBC WITH DIFFERENTIAL/PLATELET - Abnormal; Notable for the following components:   Hemoglobin 10.8 (*)    MCHC 29.5 (*)    All other components within normal limits  BRAIN NATRIURETIC PEPTIDE - Abnormal; Notable for the following components:   B Natriuretic Peptide 262.1 (*)    All other  components within normal limits  RESP PANEL BY RT-PCR (FLU A&B, COVID) ARPGX2  TROPONIN I (HIGH SENSITIVITY)  TROPONIN I (HIGH SENSITIVITY)    EKG EKG Interpretation  Date/Time:  Saturday March 29 2021 13:23:09 EDT Ventricular Rate:  86 PR Interval:    QRS Duration: 72 QT Interval:  368 QTC Calculation: 440 R Axis:   113 Text Interpretation: Atrial fibrillation Low voltage QRS Septal infarct , age undetermined Lateral infarct , age undetermined Abnormal ECG No significant change since last tracing Confirmed by Wandra Arthurs [phone]) on 03/29/2021 3:09:01 PM  Radiology DG Chest 2 View  Result Date: 03/29/2021 CLINICAL DATA:  Chest pain and shortness of breath. EXAM: CHEST - 2 VIEW COMPARISON:  08/12/2020 FINDINGS: The cardio pericardial silhouette is enlarged. There is pulmonary vascular congestion without overt pulmonary edema. Diffuse interstitial opacity again noted with new focal airspace disease at the right base on today's exam. No substantial pleural effusion. The visualized bony structures of the thorax show no acute abnormality. IMPRESSION: Cardiomegaly with vascular congestion and probable interstitial edema. Focal airspace disease at the right base.  Pneumonia not excluded. Electronically Signed   By: Misty Stanley M.D.   On: 03/29/2021 14:12    Procedures Procedures   Medications Ordered in ED Medications  furosemide (LASIX) injection 40 mg (40 mg Intravenous Given 03/29/21 1623)  cefTRIAXone (ROCEPHIN) 1 g in sodium chloride 0.9 % 100 mL IVPB (0 g Intravenous Stopped 03/29/21 1701)  azithromycin (ZITHROMAX) 500 mg in sodium chloride 0.9 % 250 mL IVPB (0 mg Intravenous Stopped 03/29/21 1815)    ED Course  I have reviewed the triage vital signs and the nursing notes.  Pertinent labs & imaging results that were available during my care of the patient were reviewed by me and considered in my medical decision making (see chart for details).    MDM Rules/Calculators/A&P                           This is a 34yoF w/ hx and pe as above.  Initial interventions: furosemide for fluid overload. Initial dose abx administered for presumed CAP on CXR.  Ddx included: acs, pe, pna, chf exacerbation, copd, ptx, venous stasis, cellulitis  All studies independently reviewed by myself, d/w the attending physician, factored into my mdm. -ekg: afib 83bpm, normal axis, normal intervals, no ischemic changes; essentially unchanged compared to prior (11/2020) -cxr: r basilar infiltrate -cmp: glucose upper 200s (baseline) -unremarkable: trop x2, cbcd (baseline), bnp (baseline), covid/flu pcr  Presentation appears most c/w mild chf exacerbation w/ R basilar CAP. Reassuring trops and ekg. No tachycardia, hypoxia, ULE edema. No wheezing or hx COPD. Lungs CTAB, reassuring cxr. Pedal edema less likely venous stasis or cellulitis.  Therefore, feel pt stable for dc home w/ close outpt f/u. Keflex and Z-pak initiated for CAP. Return precautions discussed. Pt understands and agrees.  Pt hds on reevaluation, subsequently discharged.  Final Clinical Impression(s) / ED Diagnoses Final diagnoses:  Congestive heart failure, unspecified HF chronicity, unspecified heart failure type ([place])  Community acquired pneumonia of right lower lobe of lung    Rx / DC Orders ED Discharge Orders          Ordered    cephALEXin (KEFLEX) 500 MG capsule  3 times daily        03/29/21 1956  azithromycin (ZITHROMAX Z-PAK) 250 MG tablet        03/29/21 1956             Levin Bacon, MD 03/30/21 5449    Drenda Freeze, MD 03/30/21 1622

## 2021-03-29 NOTE — ED Provider Notes (Signed)
Emergency Medicine Provider Triage Evaluation Note  [name] , a 55 y.o. female  was evaluated in triage.  Pt complains of chest pain.  Chest pain started 2 hours prior.  Pain is located midsternal and radiates to left arm.  Patient describes pain as a stabbing.  Endorses associated shortness of breath.  Patient also endorses swelling to bilateral lower legs.  Patient denies any associated nausea, vomiting, or diaphoresis.  Patient received 324 mg of aspirin and nitroglycerin with EMS.  Patient reports improvement in her symptoms after receiving these treatments.   Patient rates pain 7/10 on the pain scale currently  Denies any recent surgery, history of DVT or PE, cancer treatment, hormone therapy.    Review of Systems  Positive: Chest pain, shortness of breath, leg swelling, chills Negative: Hemoptysis, cough, syncope, palpitations   Physical Exam  BP 122/78 (BP Location: Left Arm)   Pulse 99   Temp 98.5 F (36.9 C) (Oral)   Resp 16   SpO2 98%  Gen:   Awake, no distress   Resp:  Normal effort, on 2 LPM of O2 via nasal cannula MSK:   Moves extremities without difficulty  Other:  +3 edema to bilateral lower extremities  Medical Decision Making  Medically screening exam initiated at 1:16 PM.  Appropriate orders placed.  [facility] was informed that the remainder of the evaluation will be completed by another provider, this initial triage assessment does not replace that evaluation, and the importance of remaining in the ED until their evaluation is complete.  The patient appears stable so that the remainder of the work up may be completed by another provider.       Loni Beckwith, PA-C 03/29/21 1322    Arnaldo Natal, MD 03/29/21 [phone]

## 2021-03-29 NOTE — ED Triage Notes (Signed)
Patient complains of chest pain x 2 hours. SOB with radiation to left arm. Pain worse with inspiration. Edema noted bilateral lower extremities

## 2021-04-01 ENCOUNTER — Other Ambulatory Visit: Payer: Self-pay

## 2021-04-01 ENCOUNTER — Telehealth: Payer: Self-pay

## 2021-04-01 MED ORDER — CEPHALEXIN 500 MG PO CAPS
ORAL_CAPSULE | ORAL | 0 refills | Status: DC
  Filled 2021-04-01: qty 15, 5d supply, fill #0

## 2021-04-01 MED ORDER — AZITHROMYCIN 250 MG PO TABS
ORAL_TABLET | ORAL | 0 refills | Status: DC
  Filled 2021-04-01: qty 6, 5d supply, fill #0

## 2021-04-01 MED FILL — Potassium Chloride Tab ER 20 mEq (1500 MG): ORAL | 30 days supply | Qty: 60 | Fill #2 | Status: AC

## 2021-04-01 NOTE — Telephone Encounter (Signed)
Transition Care Management Unsuccessful Follow-up Telephone Call  Date of discharge and from where:  03/29/2021 from [facility]  Attempts:  1st Attempt  Reason for unsuccessful TCM follow-up call:  Left voice message

## 2021-04-02 ENCOUNTER — Other Ambulatory Visit: Payer: Self-pay

## 2021-04-02 ENCOUNTER — Emergency Department (HOSPITAL_COMMUNITY)
Admission: EM | Admit: 2021-04-02 | Discharge: 2021-04-03 | Disposition: A | Discharge status: Home / Self Care | Payer: Medicaid Other | Attending: Emergency Medicine | Admitting: Emergency Medicine

## 2021-04-02 ENCOUNTER — Emergency Department (HOSPITAL_COMMUNITY): Payer: Medicaid Other

## 2021-04-02 ENCOUNTER — Encounter (HOSPITAL_COMMUNITY): Payer: Self-pay | Admitting: Emergency Medicine

## 2021-04-02 DIAGNOSIS — M7989 Other specified soft tissue disorders: Secondary | ICD-10-CM | POA: Insufficient documentation

## 2021-04-02 DIAGNOSIS — Z79899 Other long term (current) drug therapy: Secondary | ICD-10-CM | POA: Insufficient documentation

## 2021-04-02 DIAGNOSIS — R0602 Shortness of breath: Secondary | ICD-10-CM | POA: Insufficient documentation

## 2021-04-02 DIAGNOSIS — I110 Hypertensive heart disease with heart failure: Secondary | ICD-10-CM | POA: Diagnosis not present

## 2021-04-02 DIAGNOSIS — Z7984 Long term (current) use of oral hypoglycemic drugs: Secondary | ICD-10-CM | POA: Diagnosis not present

## 2021-04-02 DIAGNOSIS — R160 Hepatomegaly, not elsewhere classified: Secondary | ICD-10-CM | POA: Diagnosis not present

## 2021-04-02 DIAGNOSIS — R0789 Other chest pain: Secondary | ICD-10-CM | POA: Diagnosis not present

## 2021-04-02 DIAGNOSIS — R079 Chest pain, unspecified: Secondary | ICD-10-CM | POA: Diagnosis not present

## 2021-04-02 DIAGNOSIS — R609 Edema, unspecified: Secondary | ICD-10-CM | POA: Diagnosis not present

## 2021-04-02 DIAGNOSIS — J189 Pneumonia, unspecified organism: Secondary | ICD-10-CM | POA: Diagnosis not present

## 2021-04-02 DIAGNOSIS — E119 Type 2 diabetes mellitus without complications: Secondary | ICD-10-CM | POA: Diagnosis not present

## 2021-04-02 DIAGNOSIS — I5033 Acute on chronic diastolic (congestive) heart failure: Secondary | ICD-10-CM | POA: Insufficient documentation

## 2021-04-02 DIAGNOSIS — Z7901 Long term (current) use of anticoagulants: Secondary | ICD-10-CM | POA: Diagnosis not present

## 2021-04-02 DIAGNOSIS — I4819 Other persistent atrial fibrillation: Secondary | ICD-10-CM | POA: Diagnosis not present

## 2021-04-02 DIAGNOSIS — I517 Cardiomegaly: Secondary | ICD-10-CM | POA: Diagnosis not present

## 2021-04-02 DIAGNOSIS — J811 Chronic pulmonary edema: Secondary | ICD-10-CM | POA: Diagnosis not present

## 2021-04-02 DIAGNOSIS — R059 Cough, unspecified: Secondary | ICD-10-CM | POA: Diagnosis not present

## 2021-04-02 LAB — COMPREHENSIVE METABOLIC PANEL
ALT: 12 U/L (ref 0–44)
AST: 22 U/L (ref 15–41)
Albumin: 3.9 g/dL (ref 3.5–5.0)
Alkaline Phosphatase: 114 U/L (ref 38–126)
Anion gap: 11 (ref 5–15)
BUN: 17 mg/dL (ref 6–20)
CO2: 26 mmol/L (ref 22–32)
Calcium: 9.3 mg/dL (ref 8.9–10.3)
Chloride: 102 mmol/L (ref 98–111)
Creatinine, Ser: 0.77 mg/dL (ref 0.44–1.00)
GFR, Estimated: >60 mL/min (ref >60)
Glucose, Bld: 188 mg/dL — ABNORMAL HIGH (ref 70–99)
Potassium: 3.6 mmol/L (ref 3.5–5.1)
Sodium: 139 mmol/L (ref 135–145)
Total Bilirubin: 0.6 mg/dL (ref 0.3–1.2)
Total Protein: 7.7 g/dL (ref 6.5–8.1)

## 2021-04-02 LAB — CBC WITH DIFFERENTIAL/PLATELET
Abs Immature Granulocytes: 0.03 10*3/uL (ref 0.00–0.07)
Basophils Absolute: 0 10*3/uL (ref 0.0–0.1)
Basophils Relative: 1 %
Eosinophils Absolute: 0.1 10*3/uL (ref 0.0–0.5)
Eosinophils Relative: 1 %
HCT: 37.1 % (ref 36.0–46.0)
Hemoglobin: 11.3 g/dL — ABNORMAL LOW (ref 12.0–15.0)
Immature Granulocytes: 1 %
Lymphocytes Relative: 23 %
Lymphs Abs: 1.3 10*3/uL (ref 0.7–4.0)
MCH: 26.7 pg (ref 26.0–34.0)
MCHC: 30.5 g/dL (ref 30.0–36.0)
MCV: 87.7 fL (ref 80.0–100.0)
Monocytes Absolute: 0.5 10*3/uL (ref 0.1–1.0)
Monocytes Relative: 9 %
Neutro Abs: 3.7 10*3/uL (ref 1.7–7.7)
Neutrophils Relative %: 65 %
Platelets: 181 10*3/uL (ref 150–400)
RBC: 4.23 MIL/uL (ref 3.87–5.11)
RDW: 15.7 % — ABNORMAL HIGH (ref 11.5–15.5)
WBC: 5.5 10*3/uL (ref 4.0–10.5)
nRBC: 0 % (ref 0.0–0.2)

## 2021-04-02 LAB — BRAIN NATRIURETIC PEPTIDE: B Natriuretic Peptide: 261.1 pg/mL — ABNORMAL HIGH (ref 0.0–100.0)

## 2021-04-02 MED ORDER — FUROSEMIDE 10 MG/ML IJ SOLN
40.0000 mg | Freq: Once | INTRAMUSCULAR | Status: AC
  Administered 2021-04-03: 40 mg via INTRAVENOUS
  Filled 2021-04-02: qty 4

## 2021-04-02 MED ORDER — FENTANYL CITRATE (PF) 100 MCG/2ML IJ SOLN
25.0000 ug | Freq: Once | INTRAMUSCULAR | Status: AC
  Administered 2021-04-03: 25 ug via INTRAVENOUS
  Filled 2021-04-02: qty 2

## 2021-04-02 NOTE — ED Triage Notes (Addendum)
Pt BIB [facility] from home. She reports chest wall pain. States that she was recently seen and dx'd with pneumonia 4 days ago and started her antibiotics yesterday, but feels like she is not improving. Also states that both of her legs hurt.

## 2021-04-02 NOTE — ED Provider Notes (Signed)
Emergency Medicine Provider Triage Evaluation Note  [name] , a 55 y.o. female  was evaluated in triage.  Pt complains of chest wall pain for the past 4 days. Diagnosed with pneumonia and placed on Zithromax.  However, she has not had any improvement since starting these antibiotics yesterday.  Review of Systems  Positive: Shortness of breath, cough Negative: Fever, chest pain  Physical Exam  BP (!) 148/81   Pulse 96   Temp 98.6 F (37 C) (Oral)   Resp 20   SpO2 95%  Gen:   Awake, no distress   Resp:  Normal effort  MSK:   Moves extremities without difficulty  Other:    Medical Decision Making  Medically screening exam initiated at 7:14 PM.  Appropriate orders placed.  [facility] was informed that the remainder of the evaluation will be completed by another provider, this initial triage assessment does not replace that evaluation, and the importance of remaining in the ED until their evaluation is complete.     Janeece Fitting, PA-C 04/02/21 1915    Lennice Sites, DO 04/02/21 2131

## 2021-04-02 NOTE — Telephone Encounter (Signed)
Transition Care Management Follow-up Telephone Call Date of discharge and from where: 03/29/2021 - [facility] How have you been since you were released from the hospital? "Okay" Any questions or concerns? No  Items Reviewed: Did the pt receive and understand the discharge instructions provided? Yes  Medications obtained and verified? Yes  Other? No  Any new allergies since your discharge? No  Dietary orders reviewed? No Do you have support at home? No   Functional Questionnaire: (I = Independent and D = Dependent) ADLs: I  Bathing/Dressing- I  Meal Prep- I  Eating- I  Maintaining continence- I  Transferring/Ambulation- I  Managing Meds- I  Follow up appointments reviewed:  PCP Hospital f/u appt confirmed? No  - advised pt due to the reason she was seen in the ED, she should make an appointment to follow up [facility] f/u appt confirmed? No  - advised pt due to the reason she was seen in the ED, she should make an appointment to follow up with Cardiology Are transportation arrangements needed?  N/A If their condition worsens, is the pt aware to call PCP or go to the Emergency Dept.? Yes Was the patient provided with contact information for the PCP's office or ED? Yes Was to pt encouraged to call back with questions or concerns? Yes

## 2021-04-03 ENCOUNTER — Encounter (HOSPITAL_COMMUNITY): Payer: Self-pay

## 2021-04-03 ENCOUNTER — Emergency Department (HOSPITAL_COMMUNITY): Payer: Medicaid Other

## 2021-04-03 DIAGNOSIS — R160 Hepatomegaly, not elsewhere classified: Secondary | ICD-10-CM | POA: Diagnosis not present

## 2021-04-03 DIAGNOSIS — J189 Pneumonia, unspecified organism: Secondary | ICD-10-CM | POA: Diagnosis not present

## 2021-04-03 DIAGNOSIS — R0789 Other chest pain: Secondary | ICD-10-CM | POA: Diagnosis not present

## 2021-04-03 DIAGNOSIS — R079 Chest pain, unspecified: Secondary | ICD-10-CM | POA: Diagnosis not present

## 2021-04-03 LAB — TROPONIN I (HIGH SENSITIVITY): Troponin I (High Sensitivity): 3 ng/L (ref <18)

## 2021-04-03 MED ORDER — IOHEXOL 350 MG/ML SOLN
100.0000 mL | Freq: Once | INTRAVENOUS | Status: AC | PRN
  Administered 2021-04-03: 100 mL via INTRAVENOUS

## 2021-04-03 MED ORDER — SODIUM CHLORIDE (PF) 0.9 % IJ SOLN
INTRAMUSCULAR | Status: AC
  Filled 2021-04-03: qty 50

## 2021-04-03 NOTE — Discharge Instructions (Addendum)
Please finish your antibiotics as prescribed   Continue torsemide. Call your cardiologist tomorrow regarding increasing your diuretic dose   See your cardiologist this week   Return to ER if you have worse shortness of breath, chest pain, leg pain

## 2021-04-03 NOTE — ED Provider Notes (Signed)
[facility] DEPT Provider Note   CSN: [id number] Arrival date & time: 04/02/21  1830     History Chief Complaint  Patient presents with   Chest Wall Pain    [name] is a 55 y.o. female hx of HTN, heart failure, here presenting with shortness of breath and leg swelling.  Patient has been having chest pain for the last several days.  Patient was seen by me 5 days ago and was diagnosed with pneumonia and mild CHF exacerbation.  Patient was given Lasix and discharged home with Keflex and azithromycin.  Patient states that she has been taking it but she has persistent chest wall pain.  She also noticed her legs have been more swollen and tender.  Denies any fevers or chills.  Patient is on Xarelto and is been compliant with it  The history is provided by the patient.      Past Medical History:  Diagnosis Date   Acute on chronic diastolic congestive heart failure ([place]) 11/02/2013   10/03/2015, 11/13/2015, 08/03/2017   Benign essential HTN 11/28/2013   Bipolar disease, chronic ([place])    Chest pain    a. 2012 Myoview: EF 63%, no isch/infarct;  b. 04/2016 Lexiscan MV: EF 73%, no ischemia/infarct-->Low risk.   Chronic diastolic CHF (congestive heart failure) ([place]) 07/23/2011   a. 2015 Echo: EF 55-60%, Gr2 DD;  b. 09/2015 Echo: EF 60-65%, no rwma, mod dil LA, PASP 59mHg.   Cor pulmonale (chronic) (HCC)    History of thyrotoxicosis    HTN (hypertension) 11/28/2013   Hypertensive heart disease 10/18/2013   Hypoglycemia    Insulin dependent type 2 diabetes mellitus, uncontrolled ([facility]    Mediastinal adenopathy    Morbid obesity due to excess calories ([place] 02/19/2011   Morbid obesity with BMI of 50.0-59.9, adult (HCC)    OSA (obstructive sleep apnea) 03/06/2011   Persistent atrial fibrillation ([facility] 12/09/2017   Pulmonary HTN, moderate to severe 11/03/2013   Sinusitis, chronic 01/02/2015   SVT (supraventricular tachycardia) ([facility] [place] 12/06/2013   Uncontrolled type 2  diabetes mellitus with hyperglycemia ([facility]     Patient Active Problem List   Diagnosis Date Noted   Obesity hypoventilation syndrome ([place] 12/15/2019   Depression 12/15/2019   ILD (interstitial lung disease) ([facility] [place] 05/28/2019   Exertional angina ([facility] 05/28/2019   AKI (acute kidney injury) ([facility]    Vitamin B12 deficiency 07/20/2018   Hematochezia 07/14/2018   Acute posthemorrhagic anemia 07/14/2018   GIB (gastrointestinal bleeding) 07/14/2018   Chronic anticoagulation 07/14/2018   Persistent atrial fibrillation    Uncontrolled type 2 diabetes mellitus with hyperglycemia (HCC)    Hypokalemia    Cor pulmonale (chronic) (HCC)    Mycobacterium avium complex ([place] 12/13/2015   Pyrexia    Dyspnea 11/13/2015   Mediastinal adenopathy 11/13/2015   Abnormal CT scan, chest 11/11/2015   Chest pain 11/11/2015   Essential hypertension 03/07/2015   Depression (emotion) 03/07/2015   Noninfectious gastroenteritis and colitis 01/02/2015   Sinusitis, chronic 01/02/2015   Midline low back pain without sciatica 09/10/2014   Bipolar 1 disorder, mixed, moderate ([place] 07/02/2014   Stress incontinence 07/02/2014   Mania ([facility] 12/10/2013   Speech abnormality 12/08/2013   SVT (supraventricular tachycardia) ([facility] 12/06/2013   Benign essential HTN 11/28/2013   H/O non-insulin dependent diabetes mellitus 11/28/2013   Pulmonary HTN, moderate to severe 11/03/2013   Acute on chronic diastolic congestive heart failure ([facility] 11/02/2013   Hypertensive heart disease 10/18/2013   Non-insulin dependent type 2 diabetes  mellitus ([place]) 10/18/2013   Chronic diastolic heart failure ([place]) 07/23/2011   OSA (obstructive sleep apnea)- non compliant with C-pap 03/06/2011   Morbid obesity ([place]) 02/19/2011   Non-ST elevation (NSTEMI) myocardial infarction ([place]) 04/29/2009   Bipolar disorder     Past Surgical History:  Procedure Laterality Date   CARDIOVERSION N/A 04/05/2018   Procedure: CARDIOVERSION;  Surgeon:  Lelon Perla, MD;  Location: [facility];  Service: Cardiovascular;  Laterality: N/A;   COLONOSCOPY WITH PROPOFOL Left 07/16/2018   Procedure: COLONOSCOPY WITH PROPOFOL;  Surgeon: Ronnette Juniper, MD;  Location: [facility];  Service: Gastroenterology;  Laterality: Left;   LEFT HEART CATH AND CORONARY ANGIOGRAPHY N/A 08/04/2018   Procedure: LEFT HEART CATH AND CORONARY ANGIOGRAPHY;  Surgeon: Belva Crome, MD;  Location: [facility];  Service: Cardiovascular;  Laterality: N/A;   None     POLYPECTOMY  07/16/2018   Procedure: POLYPECTOMY;  Surgeon: Ronnette Juniper, MD;  Location[facility];  Service: Gastroenterology;;     OB History   No obstetric history on file.     Family History  Problem Relation Age of Onset   Heart failure Father    Stroke Father    Hypertension Mother    Heart disease Maternal Grandfather     Social History   Tobacco Use   Smoking status: Never   Smokeless tobacco: Never  Vaping Use   Vaping Use: Never used  Substance Use Topics   Alcohol use: No   Drug use: No    Home Medications Prior to Admission medications   Medication Sig Start Date End Date Taking? Authorizing Provider  Accu-Chek Softclix Lancets lancets USE AS DIRECTED TO TEST BLOOD SUGAR THREE TIMES DAILY 10/02/20 10/02/21  [name], NP  albuterol (VENTOLIN HFA) 108 (90 Base) MCG/ACT inhaler INHALE 1 TO 2 PUFFS EVERY 6 HOURS AS NEEDED FOR WHEEZING/ SHORTNESS OF BREATH Patient taking differently: Inhale 1-2 puffs into the lungs every 6 (six) hours as needed for wheezing or shortness of breath. 11/13/20 11/13/21  [name], NP  amLODipine (NORVASC) 10 MG tablet TAKE 1 TABLET (10 MG TOTAL) BY MOUTH DAILY. Patient taking differently: Take 10 mg by mouth daily. 07/02/20 07/02/21  [name], NP  atorvastatin (LIPITOR) 40 MG tablet TAKE 1 TABLET (40 MG TOTAL) BY MOUTH DAILY AT 6 PM. Patient not taking: No sig reported 07/02/20 07/02/21  [name], NP  azithromycin  (ZITHROMAX Z-PAK) 250 MG tablet Take 2 tablets (500 mg total) by mouth daily for 1 day, THEN 1 tablet (250 mg total) daily for 4 days. 03/29/21 04/03/21  Levin Bacon, MD  azithromycin (ZITHROMAX) 250 MG tablet TAke 2 tablets by mouth for 1 day, then take 1 tablet daily for 4 days. 03/29/21   Levin Bacon, MD  Blood Glucose Monitoring Suppl (ACCU-CHEK AVIVA PLUS) w/Device KIT USE AS DIRECTED TO CHECK BLOOD SUGAR ONCE DAILY 10/02/20 10/02/21  [name], NP  Blood Pressure Monitor DEVI Please provide patient with insurance approved blood pressure monitor I10.0 07/21/20   [name], NP  carvedilol (COREG) 25 MG tablet TAKE 1 TABLET (25 MG TOTAL) BY MOUTH 2 (TWO) TIMES DAILY WITH A MEAL. Patient taking differently: Take 25 mg by mouth 2 (two) times daily with a meal. 02/05/21 02/05/22  [name], NP  cephALEXin (KEFLEX) 500 MG capsule Take 1 capsule (500 mg total) by mouth 3 (three) times daily for 5 days. 03/29/21 04/03/21  Levin Bacon, MD  cephALEXin [facility])  500 MG capsule Take 1 capsule by mouth 3 times daily for 5 days. 03/29/21   Levin Bacon, MD  Dulaglutide 1.5 MG/0.5ML SOPN INJECT 1.5 MG INTO THE SKIN ONCE A WEEK. 10/31/20 10/31/21  Charlott Rakes, MD  escitalopram (LEXAPRO) 10 MG tablet TAKE 1 TABLET (10 MG TOTAL) BY MOUTH DAILY. Patient not taking: No sig reported 10/02/20 10/02/21  [name], NP  escitalopram (LEXAPRO) 20 MG tablet TAKE 1 TABLET BY MOUTH EVERY MORNING Patient taking differently: Take 20 mg by mouth every morning. 09/02/20 09/02/21  Scism, Dani Gobble, FNP  ferrous sulfate (FEROSUL) 325 (65 FE) MG tablet TAKE 1 TABLET (325 MG TOTAL) BY MOUTH 2 (TWO) TIMES DAILY. Patient taking differently: Take 325 mg by mouth daily. 01/17/21 01/17/22  [name], NP  glipiZIDE (GLUCOTROL XL) 10 MG 24 hr tablet TAKE 1 TABLET (10 MG TOTAL) BY MOUTH EVERY EVENING. Patient taking differently: Take 10 mg by mouth daily with breakfast. 10/02/20 10/02/21   [name], NP  glucose blood test strip USE AS DIRECTED TO TEST BLOOD SUGAR THREE TIMES DAILY Patient not taking: No sig reported 10/02/20 10/02/21  [name], NP  glucose blood test strip USE AS DIRECTED TO TEST BLOOD SUGAR ONCE DAILY 10/02/20 10/02/21  [name], NP  Incontinence Supply Disposable (INCONTINENCE BRIEF LARGE) MISC Please provide patient with insurance approved incontinence supplies/briefs 07/02/20   [name], NP  ketorolac (TORADOL) 10 MG tablet Take 1 tablet (10 mg total) by mouth every 6 (six) hours as needed as needed for pain Patient not taking: No sig reported 01/01/21   Argentina Donovan, PA-C  metFORMIN (GLUCOPHAGE) 1000 MG tablet TAKE 1 TABLET (1,000 MG TOTAL) BY MOUTH 2 (TWO) TIMES DAILY WITH A MEAL. Patient taking differently: Take 1,000 mg by mouth 2 (two) times daily with a meal. 10/24/20 10/24/21  Charlott Rakes, MD  Misc. Devices MISC Please provide BiPAP machine with the following: Settings 22/18 cm H2O. Medium  size [employer] Full Face Mask Simplus mask and heated  humidification. 02/28/20   [name], NP  Misc. Devices MISC Please provide patient a Blood Pressure Monitor w/ insurance approval. 07/18/20   [name], NP  Naphazoline-Pheniramine (EYE ALLERGY RELIEF OP) Place 1 drop into both eyes daily as needed (allergy).    [provider]  oxybutynin (DITROPAN) 5 MG tablet Take 5 mg by mouth 3 (three) times daily.    [provider]  Potassium Chloride ER 20 MEQ TBCR TAKE 1 TABLET BY MOUTH TWICE DAILY. Patient taking differently: Take 20 tablets by mouth 2 (two) times daily. 07/02/20 07/02/21  [name], NP  risperidone (RISPERDAL) 4 MG tablet TAKE 1 TABLET (4 MG TOTAL) BY MOUTH DAILY. Patient taking differently: Take 4 mg by mouth daily. 11/08/20 11/08/21  Charlott Rakes, MD  rivaroxaban (XARELTO) 20 MG TABS tablet TAKE 1 TABLET (20 MG TOTAL) BY MOUTH DAILY WITH SUPPER. Patient taking differently: Take 20  mg by mouth daily with supper. 10/01/20 10/01/21  [name], NP  torsemide (DEMADEX) 20 MG tablet TAKE 4 TABLETS (80 MG TOTAL) BY MOUTH 2 (TWO) TIMES DAILY. Patient taking differently: Take 80 mg by mouth 2 (two) times daily. 08/07/20 08/07/21  Lelon Perla, MD  Vitamin D, Ergocalciferol, (DRISDOL) 1.25 MG (50000 UNIT) CAPS capsule Take 1 capsule (50,000 Units total) by mouth every 7 (seven) days. Patient not taking: No sig reported 03/19/20   [name], NP  furosemide (  LASIX) 80 MG tablet Take 1 tablet (80 mg total) by mouth in the morning, at noon, and at bedtime. 12/19/19 12/30/19  Allie Bossier, MD  losartan (COZAAR) 25 MG tablet Take 0.5 tablets (12.5 mg total) by mouth daily. 07/09/20 08/07/20  [name], NP  sitaGLIPtin (JANUVIA) 100 MG tablet Take 1 tablet (100 mg total) by mouth daily. 10/02/20 10/24/20  [name], NP    Allergies    Acetaminophen, Caffeine, Farxiga [dapagliflozin], and Lisinopril  Review of Systems   Review of Systems  Cardiovascular:  Positive for chest pain.  Musculoskeletal:        Leg pain   All other systems reviewed and are negative.  Physical Exam Updated Vital Signs BP (!) 161/113   Pulse 99   Temp 98.6 F (37 C) (Oral)   Resp 15   SpO2 97%   Physical Exam Vitals and nursing note reviewed.  Constitutional:      Appearance: Normal appearance.  HENT:     Head: Normocephalic.     Nose: Nose normal.     Mouth/Throat:     Mouth: Mucous membranes are moist.  Eyes:     Extraocular Movements: Extraocular movements intact.     Pupils: Pupils are equal, round, and reactive to light.  Cardiovascular:     Rate and Rhythm: Normal rate and regular rhythm.     Pulses: Normal pulses.     Heart sounds: Normal heart sounds.  Pulmonary:     Effort: Pulmonary effort is normal.     Breath sounds: Normal breath sounds.     Comments: Mild reproducible chest wall tenderness Abdominal:     General: Abdomen is flat.     Palpations:  Abdomen is soft.  Musculoskeletal:     Cervical back: Normal range of motion and neck supple.     Comments: Trace edema bilaterally, no obvious calf tenderness, bilateral calf symmetrical   Skin:    Capillary Refill: Capillary refill takes less than 2 seconds.  Neurological:     General: No focal deficit present.     Mental Status: She is alert and oriented to person, place, and time.  Psychiatric:        Mood and Affect: Mood normal.        Behavior: Behavior normal.    ED Results / Procedures / Treatments   Labs (all labs ordered are listed, but only abnormal results are displayed) Labs Reviewed  CBC WITH DIFFERENTIAL/PLATELET - Abnormal; Notable for the following components:      Result Value   Hemoglobin 11.3 (*)    RDW 15.7 (*)    All other components within normal limits  COMPREHENSIVE METABOLIC PANEL - Abnormal; Notable for the following components:   Glucose, Bld 188 (*)    All other components within normal limits  BRAIN NATRIURETIC PEPTIDE - Abnormal; Notable for the following components:   B Natriuretic Peptide 261.1 (*)    All other components within normal limits  TROPONIN I (HIGH SENSITIVITY)    EKG None  Radiology DG Chest 2 View  Result Date: 04/02/2021 CLINICAL DATA:  Chest pain 03/29/2021, CT 08/03/2018 EXAM: CHEST - 2 VIEW COMPARISON:  03/29/2021 FINDINGS: Cardiomegaly with vascular congestion and mild diffuse ground-glass opacity likely low-grade edema. No pneumothorax. IMPRESSION: Cardiomegaly with vascular congestion and mild diffuse pulmonary edema Electronically Signed   By: Donavan Foil M.D.   On: 04/02/2021 19:38   CT Angio Chest PE W and/or Wo Contrast  Result Date: 04/03/2021  CLINICAL DATA:  Chest wall pain. Diagnosed with pneumonia 4 days ago. No improvement with antibiotics. Pulmonary embolus is suspected without probability. EXAM: CT ANGIOGRAPHY CHEST WITH CONTRAST TECHNIQUE: Multidetector CT imaging of the chest was performed using the standard  protocol during bolus administration of intravenous contrast. Multiplanar CT image reconstructions and MIPs were obtained to evaluate the vascular anatomy. CONTRAST:  122m OMNIPAQUE IOHEXOL 350 MG/ML SOLN COMPARISON:  08/03/2018.  Chest radiograph 04/02/2021 FINDINGS: Cardiovascular: Moderately good opacification of the central and segmental pulmonary arteries. No focal filling defects. No evidence of significant pulmonary embolus. Mild cardiac enlargement. No pericardial effusions. Normal caliber thoracic aorta. Mediastinum/Nodes: Esophagus is decompressed. No significant lymphadenopathy in the chest. Increased density in the anterior mediastinum, likely prominent pericardial recess. Thyroid gland is unremarkable. Lungs/Pleura: No airspace disease or consolidation in the lungs. No pleural effusions. No pneumothorax. Upper Abdomen: Liver is enlarged.  No focal lesions identified. Musculoskeletal: No chest wall abnormality. No acute or significant osseous findings. Review of the MIP images confirms the above findings. IMPRESSION: 1. No evidence of significant pulmonary embolus. 2. Cardiac enlargement.  Lungs are clear. 3. Enlarged liver. Electronically Signed   By: WLucienne CapersM.D.   On: 04/03/2021 01:07    Procedures Procedures   Medications Ordered in ED Medications  furosemide (LASIX) injection 40 mg (40 mg Intravenous Given 04/03/21 0004)  fentaNYL (SUBLIMAZE) injection 25 mcg (25 mcg Intravenous Given 04/03/21 0003)  sodium chloride (PF) 0.9 % injection (  Given by Other 04/03/21 0056)  iohexol (OMNIPAQUE) 350 MG/ML injection 100 mL (100 mLs Intravenous Contrast Given 04/03/21 0041)    ED Course  I have reviewed the triage vital signs and the nursing notes.  Pertinent labs & imaging results that were available during my care of the patient were reviewed by me and considered in my medical decision making (see chart for details).    MDM Rules/Calculators/A&P                         WOberaKPasley is a 55y.o. female here with chest pain, leg swelling.  Patient was seen a week ago with chest pain and had chest x-ray that showed possible pneumonia and was given Keflex and azithromycin.  Patient also was given Lasix and continues to take her Demadex.  Patient has trace edema currently still considered worsening CHF exacerbation.  She is on Xarelto but has persistent shortness of breath so consider PE now.  We will get a CTA chest and will get CBC and BMP and troponin and BNP.   1:26 AM BNP is 260 which is unchanged.  CT chest showed no PE or pneumonia. TShe was given another dose of Lasix in the ED.  I told her to finish her antibiotics as prescribed.  She is already on torsemide.  I told her to follow-up with her cardiologist this week regarding her torsemide dosing.   Final Clinical Impression(s) / ED Diagnoses Final diagnoses:  None    Rx / DC Orders ED Discharge Orders     None        YDrenda Freeze MD 04/03/21 0127

## 2021-04-04 ENCOUNTER — Telehealth: Payer: Self-pay

## 2021-04-04 NOTE — Telephone Encounter (Signed)
Transition Care Management Unsuccessful Follow-up Telephone Call  Date of discharge and from where:  7/72022- [name]  Attempts:  1st Attempt  Reason for unsuccessful TCM follow-up call:  Left voice message

## 2021-04-07 ENCOUNTER — Telehealth: Payer: Self-pay

## 2021-04-07 NOTE — Telephone Encounter (Signed)
Transition Care Management Unsuccessful Follow-up Telephone Call  Date of discharge and from where:  04/03/2021-[facility]  Attempts:  2nd Attempt  Reason for unsuccessful TCM follow-up call:  Left voice message

## 2021-04-08 ENCOUNTER — Other Ambulatory Visit: Payer: Self-pay

## 2021-04-08 ENCOUNTER — Other Ambulatory Visit: Payer: Self-pay | Admitting: Family Medicine

## 2021-04-08 MED ORDER — METFORMIN HCL 1000 MG PO TABS
1000.0000 mg | ORAL_TABLET | Freq: Two times a day (BID) | ORAL | 0 refills | Status: DC
  Filled 2021-04-08: qty 60, 30d supply, fill #0

## 2021-04-08 NOTE — Telephone Encounter (Signed)
Transition Care Management Unsuccessful Follow-up Telephone Call  Date of discharge and from where:  04/03/2021- [facility]  Attempts:  3rd Attempt  Reason for unsuccessful TCM follow-up call:  Left voice message

## 2021-04-08 NOTE — Telephone Encounter (Signed)
Requested medication (s) are due for refill today:  no  Requested medication (s) are on the active medication list: yes  Last refill:  02/07/2021  Future visit scheduled: no  Notes to clinic: Patient was due for follow up in May    Requested Prescriptions  Pending Prescriptions Disp Refills   metFORMIN (GLUCOPHAGE) 1000 MG tablet 60 tablet 2    Sig: Take by mouth 2 (two) times daily with a meal.      Endocrinology:  Diabetes - Biguanides Failed - 04/08/2021 10:57 AM      Failed - HBA1C is between 0 and 7.9 and within 180 days    Hemoglobin A1C  Date Value Ref Range Status  10/02/2020 9.8 (A) 4.0 - 5.6 % Final   HbA1c, POC (controlled diabetic range)  Date Value Ref Range Status  09/16/2018 7.1 (A) 0.0 - 7.0 % Final   Hgb A1c MFr Bld  Date Value Ref Range Status  11/07/2019 7.6 (H) 4.8 - 5.6 % Final    Comment:             Prediabetes: 5.7 - 6.4          Diabetes: >6.4          Glycemic control for adults with diabetes: <7.0           Passed - Cr in normal range and within 360 days    Creat  Date Value Ref Range Status  08/06/2016 0.81 0.50 - 1.10 mg/dL Final   Creatinine, Ser  Date Value Ref Range Status  04/02/2021 0.77 0.44 - 1.00 mg/dL Final   Creatinine, Urine  Date Value Ref Range Status  08/06/2018 197.25 mg/dL Final    Comment:    Performed at [facility], [place] [address] [place], [place] [address]          Passed - eGFR in normal range and within 360 days    GFR, Est African American  Date Value Ref Range Status  08/06/2016 >89 >=60 mL/min Final   GFR calc Af Amer  Date Value Ref Range Status  09/17/2020 77 >59 mL/min/1.73 Final    Comment:    **In accordance with recommendations from the NKF-ASN Task force,**   [facility] is in the process of updating its eGFR calculation to the   2021 CKD-EPI creatinine equation that estimates kidney function   without a race variable.    GFR, Est Non African American  Date Value Ref  Range Status  08/06/2016 86 >=60 mL/min Final   GFR, Estimated  Date Value Ref Range Status  04/02/2021 >60 >60 mL/min Final    Comment:    (NOTE) Calculated using the CKD-EPI Creatinine Equation (2021)    GFR  Date Value Ref Range Status  12/25/2013 117.88 >60.00 mL/min Final   eGFR  Date Value Ref Range Status  12/26/2020 65 >59 mL/min/1.73 Final          Passed - Valid encounter within last 6 months    Recent Outpatient Visits           3 months ago Thumb pain, right   [place], [place], [place]   5 months ago Uncontrolled type 2 diabetes mellitus with hyperglycemia [facility])   [place], Annie Main L, RPH-CPP   5 months ago Uncontrolled type 2 diabetes mellitus with hyperglycemia [facility])   [place], RPH-CPP  6 months ago Uncontrolled type 2 diabetes mellitus with hyperglycemia [facility])   [place], NP   8 months ago Shortness of breath at rest   [place], Vernia Buff, NP

## 2021-04-14 ENCOUNTER — Other Ambulatory Visit: Payer: Self-pay

## 2021-04-14 MED FILL — Dulaglutide Soln Auto-injector 1.5 MG/0.5ML: SUBCUTANEOUS | 28 days supply | Qty: 2 | Fill #0 | Status: CN

## 2021-04-15 ENCOUNTER — Other Ambulatory Visit: Payer: Self-pay | Admitting: Nurse Practitioner

## 2021-04-15 ENCOUNTER — Other Ambulatory Visit: Payer: Self-pay

## 2021-04-15 DIAGNOSIS — I110 Hypertensive heart disease with heart failure: Secondary | ICD-10-CM

## 2021-04-15 DIAGNOSIS — I4819 Other persistent atrial fibrillation: Secondary | ICD-10-CM

## 2021-04-15 MED ORDER — RIVAROXABAN 20 MG PO TABS
ORAL_TABLET | Freq: Every day | ORAL | 0 refills | Status: DC
  Filled 2021-04-15: qty 30, 30d supply, fill #0

## 2021-04-15 MED ORDER — AMLODIPINE BESYLATE 10 MG PO TABS
10.0000 mg | ORAL_TABLET | Freq: Every day | ORAL | 0 refills | Status: DC
  Filled 2021-04-15 – 2021-05-22 (×3): qty 30, 30d supply, fill #0

## 2021-04-15 NOTE — Telephone Encounter (Signed)
Requested medication (s) are due for refill today: no  Requested medication (s) are on the active medication list: yes   Last refill:  03/20/2021  Future visit scheduled: no  Notes to clinic: overdue for follow up appointment    Requested Prescriptions  Pending Prescriptions Disp Refills   rivaroxaban (XARELTO) 20 MG TABS tablet 90 tablet 1    Sig: TAKE 1 TABLET (20 MG TOTAL) BY MOUTH DAILY WITH SUPPER.      Hematology: Anticoagulants - rivaroxaban Failed - 04/15/2021 12:24 PM      Failed - HGB in normal range and within 360 days    Hemoglobin  Date Value Ref Range Status  04/02/2021 11.3 (L) 12.0 - 15.0 g/dL Final  09/17/2020 12.4 11.1 - 15.9 g/dL Final          Passed - ALT in normal range and within 180 days    ALT  Date Value Ref Range Status  04/02/2021 12 0 - 44 U/L Final          Passed - AST in normal range and within 180 days    AST  Date Value Ref Range Status  04/02/2021 22 15 - 41 U/L Final          Passed - Cr in normal range and within 360 days    Creat  Date Value Ref Range Status  08/06/2016 0.81 0.50 - 1.10 mg/dL Final   Creatinine, Ser  Date Value Ref Range Status  04/02/2021 0.77 0.44 - 1.00 mg/dL Final   Creatinine, Urine  Date Value Ref Range Status  08/06/2018 197.25 mg/dL Final    Comment:    Performed at [facility], [place] [address] [place], [place] [address]          Passed - HCT in normal range and within 360 days    HCT  Date Value Ref Range Status  04/02/2021 37.1 36.0 - 46.0 % Final   Hematocrit  Date Value Ref Range Status  09/17/2020 39.7 34.0 - 46.6 % Final          Passed - PLT in normal range and within 360 days    Platelets  Date Value Ref Range Status  04/02/2021 181 150 - 400 K/uL Final  09/17/2020 208 150 - 450 x10E3/uL Final          Passed - Valid encounter within last 12 months    Recent Outpatient Visits           3 months ago Thumb pain, right   [place], [place], [place]   5 months ago Uncontrolled type 2 diabetes mellitus with hyperglycemia [facility])   [place], Annie Main L, RPH-CPP   5 months ago Uncontrolled type 2 diabetes mellitus with hyperglycemia [facility])   [place], Annie Main L, RPH-CPP   6 months ago Uncontrolled type 2 diabetes mellitus with hyperglycemia [facility])   [place], [place] W, NP   8 months ago Shortness of breath at rest   [place], NP                  amLODipine (NORVASC) 10 MG tablet 90 tablet 1    Sig: TAKE 1 TABLET (10 MG TOTAL) BY MOUTH DAILY.      Cardiovascular:  Calcium Channel Blockers Failed - 04/15/2021 12:24 PM  Failed - Last BP in normal range    BP Readings from Last 1 Encounters:  04/03/21 (!) 158/100          Passed - Valid encounter within last 6 months    Recent Outpatient Visits           3 months ago Thumb pain, right   [place], [place], [place]   5 months ago Uncontrolled type 2 diabetes mellitus with hyperglycemia [facility])   [place], Annie Main L, RPH-CPP   5 months ago Uncontrolled type 2 diabetes mellitus with hyperglycemia [facility])   [place], Jarome [place]atin, RPH-CPP   6 months ago Uncontrolled type 2 diabetes mellitus with hyperglycemia [facility])   [place], Vernia Buff, NP   8 months ago Shortness of breath at rest   [place], Vernia Buff, NP

## 2021-04-17 ENCOUNTER — Other Ambulatory Visit: Payer: Self-pay

## 2021-04-17 ENCOUNTER — Emergency Department (HOSPITAL_COMMUNITY): Payer: Medicaid Other

## 2021-04-17 ENCOUNTER — Encounter (HOSPITAL_COMMUNITY): Payer: Self-pay | Admitting: *Deleted

## 2021-04-17 ENCOUNTER — Inpatient Hospital Stay (HOSPITAL_COMMUNITY)
Admission: EM | Admit: 2021-04-17 | Discharge: 2021-04-23 | DRG: 291 | Disposition: A | Discharge status: Home / Self Care | Payer: Medicaid Other | Attending: Internal Medicine | Admitting: Internal Medicine

## 2021-04-17 DIAGNOSIS — Z79899 Other long term (current) drug therapy: Secondary | ICD-10-CM

## 2021-04-17 DIAGNOSIS — E66813 Obesity, class 3: Secondary | ICD-10-CM

## 2021-04-17 DIAGNOSIS — R0902 Hypoxemia: Secondary | ICD-10-CM | POA: Diagnosis not present

## 2021-04-17 DIAGNOSIS — G4733 Obstructive sleep apnea (adult) (pediatric): Secondary | ICD-10-CM | POA: Diagnosis present

## 2021-04-17 DIAGNOSIS — Z7984 Long term (current) use of oral hypoglycemic drugs: Secondary | ICD-10-CM

## 2021-04-17 DIAGNOSIS — I2729 Other secondary pulmonary hypertension: Secondary | ICD-10-CM | POA: Diagnosis present

## 2021-04-17 DIAGNOSIS — Q245 Malformation of coronary vessels: Secondary | ICD-10-CM

## 2021-04-17 DIAGNOSIS — I110 Hypertensive heart disease with heart failure: Principal | ICD-10-CM | POA: Diagnosis present

## 2021-04-17 DIAGNOSIS — Z9102 Food additives allergy status: Secondary | ICD-10-CM

## 2021-04-17 DIAGNOSIS — R609 Edema, unspecified: Secondary | ICD-10-CM

## 2021-04-17 DIAGNOSIS — K5900 Constipation, unspecified: Secondary | ICD-10-CM | POA: Diagnosis present

## 2021-04-17 DIAGNOSIS — R739 Hyperglycemia, unspecified: Secondary | ICD-10-CM | POA: Diagnosis not present

## 2021-04-17 DIAGNOSIS — I071 Rheumatic tricuspid insufficiency: Secondary | ICD-10-CM | POA: Diagnosis present

## 2021-04-17 DIAGNOSIS — I5033 Acute on chronic diastolic (congestive) heart failure: Secondary | ICD-10-CM | POA: Diagnosis present

## 2021-04-17 DIAGNOSIS — Z886 Allergy status to analgesic agent status: Secondary | ICD-10-CM

## 2021-04-17 DIAGNOSIS — R079 Chest pain, unspecified: Principal | ICD-10-CM

## 2021-04-17 DIAGNOSIS — Z6841 Body Mass Index (BMI) 40.0 and over, adult: Secondary | ICD-10-CM

## 2021-04-17 DIAGNOSIS — E785 Hyperlipidemia, unspecified: Secondary | ICD-10-CM | POA: Diagnosis present

## 2021-04-17 DIAGNOSIS — Z8249 Family history of ischemic heart disease and other diseases of the circulatory system: Secondary | ICD-10-CM

## 2021-04-17 DIAGNOSIS — I509 Heart failure, unspecified: Secondary | ICD-10-CM

## 2021-04-17 DIAGNOSIS — I4819 Other persistent atrial fibrillation: Secondary | ICD-10-CM | POA: Diagnosis present

## 2021-04-17 DIAGNOSIS — R0602 Shortness of breath: Secondary | ICD-10-CM | POA: Diagnosis not present

## 2021-04-17 DIAGNOSIS — R7989 Other specified abnormal findings of blood chemistry: Secondary | ICD-10-CM | POA: Diagnosis not present

## 2021-04-17 DIAGNOSIS — R0789 Other chest pain: Secondary | ICD-10-CM | POA: Diagnosis not present

## 2021-04-17 DIAGNOSIS — M7989 Other specified soft tissue disorders: Secondary | ICD-10-CM | POA: Diagnosis not present

## 2021-04-17 DIAGNOSIS — E876 Hypokalemia: Secondary | ICD-10-CM | POA: Diagnosis present

## 2021-04-17 DIAGNOSIS — I517 Cardiomegaly: Secondary | ICD-10-CM | POA: Diagnosis not present

## 2021-04-17 DIAGNOSIS — Z9111 Patient's noncompliance with dietary regimen: Secondary | ICD-10-CM

## 2021-04-17 DIAGNOSIS — R778 Other specified abnormalities of plasma proteins: Secondary | ICD-10-CM | POA: Diagnosis not present

## 2021-04-17 DIAGNOSIS — J811 Chronic pulmonary edema: Secondary | ICD-10-CM | POA: Diagnosis not present

## 2021-04-17 DIAGNOSIS — Z7901 Long term (current) use of anticoagulants: Secondary | ICD-10-CM

## 2021-04-17 DIAGNOSIS — Z20822 Contact with and (suspected) exposure to covid-19: Secondary | ICD-10-CM | POA: Diagnosis present

## 2021-04-17 DIAGNOSIS — I248 Other forms of acute ischemic heart disease: Secondary | ICD-10-CM | POA: Diagnosis present

## 2021-04-17 DIAGNOSIS — Z888 Allergy status to other drugs, medicaments and biological substances status: Secondary | ICD-10-CM

## 2021-04-17 DIAGNOSIS — Z9119 Patient's noncompliance with other medical treatment and regimen: Secondary | ICD-10-CM

## 2021-04-17 DIAGNOSIS — E119 Type 2 diabetes mellitus without complications: Secondary | ICD-10-CM | POA: Diagnosis present

## 2021-04-17 DIAGNOSIS — F319 Bipolar disorder, unspecified: Secondary | ICD-10-CM | POA: Diagnosis present

## 2021-04-17 DIAGNOSIS — Z9114 Patient's other noncompliance with medication regimen: Secondary | ICD-10-CM

## 2021-04-17 LAB — CBC WITH DIFFERENTIAL/PLATELET
Abs Immature Granulocytes: 0.02 10*3/uL (ref 0.00–0.07)
Basophils Absolute: 0 10*3/uL (ref 0.0–0.1)
Basophils Relative: 1 %
Eosinophils Absolute: 0.1 10*3/uL (ref 0.0–0.5)
Eosinophils Relative: 1 %
HCT: 39.5 % (ref 36.0–46.0)
Hemoglobin: 11.9 g/dL — ABNORMAL LOW (ref 12.0–15.0)
Immature Granulocytes: 0 %
Lymphocytes Relative: 16 %
Lymphs Abs: 0.8 10*3/uL (ref 0.7–4.0)
MCH: 26.4 pg (ref 26.0–34.0)
MCHC: 30.1 g/dL (ref 30.0–36.0)
MCV: 87.6 fL (ref 80.0–100.0)
Monocytes Absolute: 0.4 10*3/uL (ref 0.1–1.0)
Monocytes Relative: 8 %
Neutro Abs: 3.7 10*3/uL (ref 1.7–7.7)
Neutrophils Relative %: 74 %
Platelets: 204 10*3/uL (ref 150–400)
RBC: 4.51 MIL/uL (ref 3.87–5.11)
RDW: 15.5 % (ref 11.5–15.5)
WBC: 5.1 10*3/uL (ref 4.0–10.5)
nRBC: 0 % (ref 0.0–0.2)

## 2021-04-17 LAB — BASIC METABOLIC PANEL
Anion gap: 8 (ref 5–15)
BUN: 12 mg/dL (ref 6–20)
CO2: 30 mmol/L (ref 22–32)
Calcium: 9.1 mg/dL (ref 8.9–10.3)
Chloride: 100 mmol/L (ref 98–111)
Creatinine, Ser: 0.92 mg/dL (ref 0.44–1.00)
GFR, Estimated: >60 mL/min (ref >60)
Glucose, Bld: 296 mg/dL — ABNORMAL HIGH (ref 70–99)
Potassium: 3.2 mmol/L — ABNORMAL LOW (ref 3.5–5.1)
Sodium: 138 mmol/L (ref 135–145)

## 2021-04-17 LAB — TROPONIN I (HIGH SENSITIVITY)
Troponin I (High Sensitivity): 34 ng/L — ABNORMAL HIGH (ref <18)
Troponin I (High Sensitivity): 193 ng/L (ref <18)

## 2021-04-17 LAB — RESP PANEL BY RT-PCR (FLU A&B, COVID) ARPGX2
Influenza A by PCR: NEGATIVE
Influenza B by PCR: NEGATIVE
SARS Coronavirus 2 by RT PCR: NEGATIVE

## 2021-04-17 LAB — I-STAT BETA HCG BLOOD, ED (MC, WL, AP ONLY): I-stat hCG, quantitative: <5 m[IU]/mL (ref <5)

## 2021-04-17 LAB — CBG MONITORING, ED
Glucose-Capillary: 148 mg/dL — ABNORMAL HIGH (ref 70–99)
Glucose-Capillary: 214 mg/dL — ABNORMAL HIGH (ref 70–99)

## 2021-04-17 LAB — BRAIN NATRIURETIC PEPTIDE: B Natriuretic Peptide: 246.1 pg/mL — ABNORMAL HIGH (ref 0.0–100.0)

## 2021-04-17 MED ORDER — ASPIRIN 81 MG PO CHEW
324.0000 mg | CHEWABLE_TABLET | Freq: Once | ORAL | Status: AC
  Administered 2021-04-17: 324 mg via ORAL
  Filled 2021-04-17: qty 4

## 2021-04-17 MED ORDER — FUROSEMIDE 10 MG/ML IJ SOLN
80.0000 mg | Freq: Once | INTRAMUSCULAR | Status: AC
  Administered 2021-04-17: 80 mg via INTRAVENOUS
  Filled 2021-04-17: qty 8

## 2021-04-17 MED ORDER — INSULIN ASPART 100 UNIT/ML IJ SOLN
4.0000 [IU] | Freq: Three times a day (TID) | INTRAMUSCULAR | Status: DC
  Administered 2021-04-18 – 2021-04-19 (×3): 4 [IU] via SUBCUTANEOUS

## 2021-04-17 MED ORDER — INSULIN ASPART 100 UNIT/ML IJ SOLN
0.0000 [IU] | Freq: Three times a day (TID) | INTRAMUSCULAR | Status: DC
  Administered 2021-04-18: 7 [IU] via SUBCUTANEOUS
  Administered 2021-04-18: 4 [IU] via SUBCUTANEOUS
  Administered 2021-04-18 – 2021-04-19 (×2): 7 [IU] via SUBCUTANEOUS
  Administered 2021-04-19: 4 [IU] via SUBCUTANEOUS
  Administered 2021-04-19: 15 [IU] via SUBCUTANEOUS
  Administered 2021-04-20: 4 [IU] via SUBCUTANEOUS
  Administered 2021-04-20: 7 [IU] via SUBCUTANEOUS
  Administered 2021-04-20: 11 [IU] via SUBCUTANEOUS
  Administered 2021-04-21: 7 [IU] via SUBCUTANEOUS
  Administered 2021-04-21: 4 [IU] via SUBCUTANEOUS
  Administered 2021-04-21: 7 [IU] via SUBCUTANEOUS
  Administered 2021-04-22: 11 [IU] via SUBCUTANEOUS
  Administered 2021-04-22 – 2021-04-23 (×3): 4 [IU] via SUBCUTANEOUS
  Administered 2021-04-23: 7 [IU] via SUBCUTANEOUS
  Administered 2021-04-23: 3 [IU] via SUBCUTANEOUS

## 2021-04-17 MED ORDER — RIVAROXABAN 20 MG PO TABS
20.0000 mg | ORAL_TABLET | Freq: Every day | ORAL | Status: DC
  Administered 2021-04-18 – 2021-04-23 (×6): 20 mg via ORAL
  Filled 2021-04-17 (×7): qty 1

## 2021-04-17 MED ORDER — INSULIN ASPART 100 UNIT/ML IJ SOLN
0.0000 [IU] | Freq: Every day | INTRAMUSCULAR | Status: DC
  Administered 2021-04-17: 2 [IU] via SUBCUTANEOUS
  Administered 2021-04-18: 3 [IU] via SUBCUTANEOUS

## 2021-04-17 MED ORDER — RISPERIDONE 2 MG PO TABS
4.0000 mg | ORAL_TABLET | Freq: Every day | ORAL | Status: DC
  Administered 2021-04-18 – 2021-04-23 (×6): 4 mg via ORAL
  Filled 2021-04-17 (×6): qty 2

## 2021-04-17 MED ORDER — OXYBUTYNIN CHLORIDE 5 MG PO TABS
5.0000 mg | ORAL_TABLET | Freq: Three times a day (TID) | ORAL | Status: DC
  Administered 2021-04-17 – 2021-04-23 (×17): 5 mg via ORAL
  Filled 2021-04-17 (×18): qty 1

## 2021-04-17 MED ORDER — NITROGLYCERIN 0.4 MG SL SUBL
0.4000 mg | SUBLINGUAL_TABLET | SUBLINGUAL | Status: DC | PRN
  Administered 2021-04-17: 0.4 mg via SUBLINGUAL
  Filled 2021-04-17 (×2): qty 1

## 2021-04-17 MED ORDER — POTASSIUM CHLORIDE CRYS ER 20 MEQ PO TBCR
40.0000 meq | EXTENDED_RELEASE_TABLET | Freq: Once | ORAL | Status: AC
  Administered 2021-04-17: 40 meq via ORAL
  Filled 2021-04-17: qty 2

## 2021-04-17 MED ORDER — INSULIN GLARGINE 100 UNIT/ML ~~LOC~~ SOLN
15.0000 [IU] | Freq: Every day | SUBCUTANEOUS | Status: DC
  Administered 2021-04-17 – 2021-04-20 (×4): 15 [IU] via SUBCUTANEOUS
  Filled 2021-04-17 (×5): qty 0.15

## 2021-04-17 MED ORDER — FUROSEMIDE 10 MG/ML IJ SOLN
80.0000 mg | Freq: Two times a day (BID) | INTRAMUSCULAR | Status: DC
  Administered 2021-04-18 – 2021-04-19 (×4): 80 mg via INTRAVENOUS
  Filled 2021-04-17 (×5): qty 8

## 2021-04-17 MED ORDER — SENNOSIDES-DOCUSATE SODIUM 8.6-50 MG PO TABS: 1.0000 | ORAL_TABLET | Freq: Every evening | ORAL | Status: DC | PRN

## 2021-04-17 MED ORDER — BISACODYL 5 MG PO TBEC: 5.0000 mg | DELAYED_RELEASE_TABLET | Freq: Every day | ORAL | Status: DC | PRN

## 2021-04-17 MED ORDER — AMLODIPINE BESYLATE 10 MG PO TABS
10.0000 mg | ORAL_TABLET | Freq: Every day | ORAL | Status: DC
  Administered 2021-04-18 – 2021-04-23 (×6): 10 mg via ORAL
  Filled 2021-04-17: qty 1
  Filled 2021-04-17: qty 2
  Filled 2021-04-17 (×4): qty 1

## 2021-04-17 MED ORDER — CARVEDILOL 25 MG PO TABS
25.0000 mg | ORAL_TABLET | Freq: Two times a day (BID) | ORAL | Status: DC
  Administered 2021-04-17 – 2021-04-23 (×13): 25 mg via ORAL
  Filled 2021-04-17 (×9): qty 1
  Filled 2021-04-17: qty 2
  Filled 2021-04-17: qty 1
  Filled 2021-04-17: qty 2
  Filled 2021-04-17: qty 1

## 2021-04-17 NOTE — Progress Notes (Signed)
Lower extremity venous has been completed.   Preliminary results in CV Proc.   Abram Sander 04/17/2021 3:38 PM

## 2021-04-17 NOTE — Consult Note (Addendum)
Cardiology Consultation:   Patient ID: [name] MRN: [id number]; DOB: [birth year]  Admit date: 04/17/2021 Date of Consult: 04/17/2021  PCP:  [name], NP   [facility] Providers Cardiologist:  [name], MD  Cardiology APP:  Deberah Pelton, NP       Patient Profile:   [name] is a 55 y.o. 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  History of Present Illness:   [name] is a 55 y.o. 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  The patient reports chest pain that  started this morning, middle of the chest radiating to the left shoulder and b/l leg swelling. The chest pain is stabbing in nature, worse with respiration, coughing and gets better with sitting up. She also reports that massaging helped and her chest pain has been chronic in nature. She also endorses orthopnea, pnd, LE edema Non compliant with meds. Morbidly obese.  ER work up: BP 165/108, 95% on RA, K 3.2, BNP 246, Trop 34-> 193 EKG showing Afib with anteroseptal infarct CXR shows cardiomegaly and pulm edema She got aspirin, nitro, IV lasix 45m x1 and kdur 486m.  Past Medical History:  Diagnosis Date   Acute on chronic diastolic congestive heart failure (HCWeidman02/01/2014   10/03/2015, 11/13/2015, 08/03/2017   Benign essential HTN 11/28/2013   Bipolar disease, chronic ([place]   Chest pain    a. 2012  Myoview: EF 63%, no isch/infarct;  b. 04/2016 Lexiscan MV: EF 73%, no ischemia/infarct-->Low risk.   Chronic diastolic CHF (congestive heart failure) (HCEast Sparta10/25/2012   a. 2015 Echo: EF 55-60%, Gr2 DD;  b. 09/2015 Echo: EF 60-65%, no rwma, mod dil LA, PASP 5197m.   Cor pulmonale (chronic) (HCC)    History of thyrotoxicosis    HTN (hypertension) 11/28/2013   Hypertensive heart disease 10/18/2013   Hypoglycemia    Insulin dependent type 2 diabetes mellitus, uncontrolled (HCCFult[place]  Mediastinal adenopathy    Morbid obesity due to excess calories (HCCVega Baja/24/2012   Morbid obesity with BMI of 50.0-59.9, adult (HCC)    OSA (obstructive sleep apnea) 03/06/2011   Persistent atrial fibrillation (HCC[place]3/14/2019   Pulmonary HTN, moderate to severe 11/03/2013   Sinusitis, chronic 01/02/2015   SVT (supraventricular tachycardia) (HCCKirk/07/2014   Uncontrolled type 2 diabetes mellitus with hyperglycemia ([facility]   Past Surgical History:  Procedure Laterality Date   CARDIOVERSION N/A 04/05/2018   Procedure: CARDIOVERSION;  Surgeon: CreLelon PerlaD;  [facility];  Laterality: N/A;   COLONOSCOPY WITH PROPOFOL Left 07/16/2018   Procedure: COLONOSCOPY WITH PROPOFOL;  Surgeon: KarRonnette JuniperD;  [facility];  Service: Gastroenterology;  Laterality: Left;   LEFT HEART CATH AND CORONARY ANGIOGRAPHY N/A 08/04/2018   Procedure: LEFT HEART CATH AND CORONARY ANGIOGRAPHY;  Surgeon: SmiBelva CromeD;  [facility];  Service: Cardiovascular;  Laterality: N/A;   None     POLYPECTOMY  07/16/2018   Procedure: POLYPECTOMY;  Surgeon: Ronnette Juniper, MD;  [facility];  Service: Gastroenterology;;     Home Medications:  Prior to Admission medications   Medication Sig Start Date End Date Taking? Authorizing Provider  Accu-Chek Softclix Lancets lancets USE AS DIRECTED TO TEST BLOOD SUGAR THREE TIMES DAILY 10/02/20 10/02/21  [name], NP  albuterol  (VENTOLIN HFA) 108 (90 Base) MCG/ACT inhaler INHALE 1 TO 2 PUFFS EVERY 6 HOURS AS NEEDED FOR WHEEZING/ SHORTNESS OF BREATH Patient taking differently: Inhale 1-2 puffs into the lungs every 6 (six) hours as needed for wheezing or shortness of breath. 11/13/20 11/13/21  [name], NP  amLODipine (NORVASC) 10 MG tablet Take 1 tablet (10 mg total) by mouth daily. 04/15/21 05/15/21  Charlott Rakes, MD  atorvastatin (LIPITOR) 40 MG tablet TAKE 1 TABLET (40 MG TOTAL) BY MOUTH DAILY AT 6 PM. Patient not taking: No sig reported 07/02/20 07/02/21  [name], NP  azithromycin (ZITHROMAX) 250 MG tablet TAke 2 tablets by mouth for 1 day, then take 1 tablet daily for 4 days. 03/29/21   Levin Bacon, MD  Blood Glucose Monitoring Suppl (ACCU-CHEK AVIVA PLUS) w/Device KIT USE AS DIRECTED TO CHECK BLOOD SUGAR ONCE DAILY 10/02/20 10/02/21  [name], NP  Blood Pressure Monitor DEVI Please provide patient with insurance approved blood pressure monitor I10.0 07/21/20   [name], NP  carvedilol (COREG) 25 MG tablet TAKE 1 TABLET (25 MG TOTAL) BY MOUTH 2 (TWO) TIMES DAILY WITH A MEAL. Patient taking differently: Take 25 mg by mouth 2 (two) times daily with a meal. 02/05/21 02/05/22  [name], NP  cephALEXin (KEFLEX) 500 MG capsule Take 1 capsule by mouth 3 times daily for 5 days. 03/29/21   Levin Bacon, MD  Dulaglutide 1.5 MG/0.5ML SOPN INJECT 1.5 MG INTO THE SKIN ONCE A WEEK. 10/31/20 10/31/21  Charlott Rakes, MD  escitalopram (LEXAPRO) 10 MG tablet TAKE 1 TABLET (10 MG TOTAL) BY MOUTH DAILY. Patient not taking: No sig reported 10/02/20 10/02/21  [name], NP  escitalopram (LEXAPRO) 20 MG tablet TAKE 1 TABLET BY MOUTH EVERY MORNING Patient taking differently: Take 20 mg by mouth every morning. 09/02/20 09/02/21  Scism, Dani Gobble, FNP  ferrous sulfate (FEROSUL) 325 (65 FE) MG tablet TAKE 1 TABLET (325 MG TOTAL) BY MOUTH 2 (TWO) TIMES DAILY. Patient taking differently: Take 325 mg  by mouth daily. 01/17/21 01/17/22  [name], NP  glipiZIDE (GLUCOTROL XL) 10 MG 24 hr tablet TAKE 1 TABLET (10 MG TOTAL) BY MOUTH EVERY EVENING. Patient taking differently: Take 10 mg by mouth daily with breakfast. 10/02/20 10/02/21  [name], NP  glucose blood test strip USE AS DIRECTED TO TEST BLOOD SUGAR THREE TIMES DAILY Patient not taking: No sig reported 10/02/20 10/02/21  [name], NP  glucose blood test strip USE AS DIRECTED TO TEST BLOOD SUGAR ONCE DAILY 10/02/20 10/02/21  [name], NP  Incontinence Supply Disposable (INCONTINENCE BRIEF LARGE) MISC Please provide patient with insurance approved incontinence supplies/briefs 07/02/20   [name], NP  ketorolac (TORADOL) 10 MG tablet Take 1 tablet (10 mg total) by mouth every 6 (six) hours as needed as needed for pain Patient not taking: No sig reported 01/01/21   Argentina Donovan, PA-C  metFORMIN (GLUCOPHAGE) 1000 MG tablet Take 1 tablet (1,000 mg total) by mouth 2 (two) times daily with a meal. 04/08/21 05/14/21  Charlott Rakes, MD  Misc. Devices  MISC Please provide BiPAP machine with the following: Settings 22/18 cm H2O. Medium  size [employer] Full Face Mask Simplus mask and heated  humidification. 02/28/20   [name], NP  Misc. Devices MISC Please provide patient a Blood Pressure Monitor w/ insurance approval. 07/18/20   [name], NP  Naphazoline-Pheniramine (EYE ALLERGY RELIEF OP) Place 1 drop into both eyes daily as needed (allergy).    [provider]  oxybutynin (DITROPAN) 5 MG tablet Take 5 mg by mouth 3 (three) times daily.    [provider]  Potassium Chloride ER 20 MEQ TBCR TAKE 1 TABLET BY MOUTH TWICE DAILY. Patient taking differently: Take 20 tablets by mouth 2 (two) times daily. 07/02/20 07/02/21  [name], NP  risperidone (RISPERDAL) 4 MG tablet TAKE 1 TABLET (4 MG TOTAL) BY MOUTH DAILY. Patient taking differently: Take 4 mg by mouth daily. 11/08/20 11/08/21   Charlott Rakes, MD  rivaroxaban (XARELTO) 20 MG TABS tablet TAKE 1 TABLET (20 MG TOTAL) BY MOUTH DAILY WITH SUPPER. 04/15/21 04/15/22  Charlott Rakes, MD  torsemide (DEMADEX) 20 MG tablet TAKE 4 TABLETS (80 MG TOTAL) BY MOUTH 2 (TWO) TIMES DAILY. Patient taking differently: Take 80 mg by mouth 2 (two) times daily. 08/07/20 08/07/21  Lelon Perla, MD  Vitamin D, Ergocalciferol, (DRISDOL) 1.25 MG (50000 UNIT) CAPS capsule Take 1 capsule (50,000 Units total) by mouth every 7 (seven) days. Patient not taking: No sig reported 03/19/20   [name], NP  furosemide (LASIX) 80 MG tablet Take 1 tablet (80 mg total) by mouth in the morning, at noon, and at bedtime. 12/19/19 12/30/19  Allie Bossier, MD  losartan (COZAAR) 25 MG tablet Take 0.5 tablets (12.5 mg total) by mouth daily. 07/09/20 08/07/20  [name], NP  sitaGLIPtin (JANUVIA) 100 MG tablet Take 1 tablet (100 mg total) by mouth daily. 10/02/20 10/24/20  [name], NP    Inpatient Medications: Scheduled Meds:  [START ON 04/18/2021] amLODipine  10 mg Oral Daily   [START ON 04/18/2021] furosemide  80 mg Intravenous BID   oxybutynin  5 mg Oral TID   [START ON 04/18/2021] risperidone  4 mg Oral Daily   Continuous Infusions:  PRN Meds: bisacodyl, nitroGLYCERIN, senna-docusate  Allergies:    Allergies  Allergen Reactions   Acetaminophen Other (See Comments)    Seizure-like "fits" as a child   Caffeine     Tense, anxiety, increased urination   [place] [Dapagliflozin] Other (See Comments)    Hallucinations, drop in blood sugar   Lisinopril Rash    Rash with lisinopril; but fosinopril is ok per patient    Social History:   Social History   Socioeconomic History   Marital status: Single    Spouse name: Not on file   Number of children: 0   Years of education: 18   Highest education level: Not on file  Occupational History   Occupation: unemployed  Tobacco Use   Smoking status: Never   Smokeless tobacco: Never   Vaping Use   Vaping Use: Never used  Substance and Sexual Activity   Alcohol use: No   Drug use: No   Sexual activity: Not Currently    Birth control/protection: None  Other Topics Concern   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 But ran out of money  and is no longer attending school. (Note patient has bipolar disorder).      Born in [place] but lived in [place] before coming back to [place].       Primary language is Arabic. Lives with mother and brother.   Social Determinants of Health   Financial Resource Strain: Not on file  Food Insecurity: Not on file  Transportation Needs: Not on file  Physical Activity: Not on file  Stress: Not on file  Social Connections: Not on file  Intimate Partner Violence: Not on file    Family History:   As noted below Family History  Problem Relation Age of Onset   Heart failure Father    Stroke Father    Hypertension Mother    Heart disease Maternal Grandfather      ROS:  Please see the history of present illness.  As noted above All other ROS reviewed and negative.     Physical Exam/Data:   Vitals:   04/17/21 2000 04/17/21 2030 04/17/21 2115 04/17/21 2130  BP: (!) 126/112 (!) 160/103 (!) 144/89 (!) 144/95  Pulse: 88 82 92 90  Resp: (!) '26 16 20   ' Temp:      TempSrc:      SpO2: 100% 98% 96% 98%   No intake or output data in the 24 hours ending 04/17/21 2256 Last 3 Weights 03/29/2021 12/26/2020 10/02/2020  Weight (lbs) 333 lb 341 lb 6.4 oz 340 lb  Weight (kg) 151.048 kg 154.858 kg 154.223 kg     There is no height or weight on file to calculate BMI.  General:  Well nourished, well developed, in no acute distress, morbidly obese HEENT: normal Lymph: no adenopathy Neck: no JVD Endocrine:  No thryomegaly Vascular: No carotid bruits; FA pulses 2+ bilaterally without bruits  Cardiac:  normal S1, S2; RRR; no murmur ,JVD  upper neck Lungs:  b/l crackles Abd: soft, nontender, no hepatomegaly  Ext: 2+ edema Musculoskeletal:  No deformities, BUE and BLE strength normal and equal Skin: warm and dry  Neuro:  CNs 2-12 intact, no focal abnormalities noted Psych:  Normal affect     Laboratory Data:  High Sensitivity Troponin:   Recent Labs  Lab 03/29/21 1323 03/29/21 1531 04/02/21 2348 04/17/21 1445 04/17/21 1645  TROPONINIHS '4 5 3 ' 34* 193*     Chemistry Recent Labs  Lab 04/17/21 1445  NA 138  K 3.2*  CL 100  CO2 30  GLUCOSE 296*  BUN 12  CREATININE 0.92  CALCIUM 9.1  GFRNONAA >60  ANIONGAP 8    No results for input(s): PROT, ALBUMIN, AST, ALT, ALKPHOS, BILITOT in the last 168 hours. Hematology Recent Labs  Lab 04/17/21 1445  WBC 5.1  RBC 4.51  HGB 11.9*  HCT 39.5  MCV 87.6  MCH 26.4  MCHC 30.1  RDW 15.5  PLT 204   BNP Recent Labs  Lab 04/17/21 1445  BNP 246.1*    DDimer No results for input(s): DDIMER in the last 168 hours.   Radiology/Studies:  DG Chest 2 View  Result Date: 04/17/2021 CLINICAL DATA:  Chest pain. EXAM: CHEST - 2 VIEW COMPARISON:  Chest radiograph 04/02/2021 and CTA 04/03/2021 FINDINGS: Enlargement of the cardiac silhouette is unchanged. There is persistent pulmonary vascular congestion with bilateral interstitial and hazy airspace opacities, stable to slightly increased compared to the prior radiographs. No pleural effusion or pneumothorax is identified. No acute osseous abnormality is seen. IMPRESSION: Cardiomegaly and mild pulmonary edema. Electronically Signed   By: Zenia Resides  Jeralyn Ruths M.D.   On: 04/17/2021 15:42   VAS [place] LOWER EXTREMITY VENOUS (DVT) (ONLY [facility])  Result Date: 04/17/2021  Lower Venous DVT Study Patient Name:  [name]  Date of Exam:   04/17/2021 Medical Rec #: [id number]      Accession #:    [id number] Date of Birth: 09/08/1966      Patient Gender: F Patient Age:   89U Exam Location:  [facility] Procedure:      VAS [place] LOWER  EXTREMITY VENOUS (DVT) Referring Phys: [id number] Marcello Fennel --------------------------------------------------------------------------------  Indications: Swelling, and Edema.  Limitations: Body habitus and poor ultrasound/tissue interface. Comparison Study: no prior Performing Technologist: Archie Patten RVS  Examination Guidelines: A complete evaluation includes B-mode imaging, spectral Doppler, color Doppler, and power Doppler as needed of all accessible portions of each vessel. Bilateral testing is considered an integral part of a complete examination. Limited examinations for reoccurring indications may be performed as noted. The reflux portion of the exam is performed with the patient in reverse Trendelenburg.  +---------+---------------+---------+-----------+----------+-------------------+ RIGHT    CompressibilityPhasicitySpontaneityPropertiesThrombus Aging      +---------+---------------+---------+-----------+----------+-------------------+ CFV      Full           Yes      Yes                                      +---------+---------------+---------+-----------+----------+-------------------+ SFJ      Full                                                             +---------+---------------+---------+-----------+----------+-------------------+ FV Prox  Full                                                             +---------+---------------+---------+-----------+----------+-------------------+ FV Mid                  Yes      Yes                                      +---------+---------------+---------+-----------+----------+-------------------+ FV Distal               Yes      Yes                                      +---------+---------------+---------+-----------+----------+-------------------+ PFV      Full                                                             +---------+---------------+---------+-----------+----------+-------------------+  POP      Full  Yes      Yes                                      +---------+---------------+---------+-----------+----------+-------------------+ PTV      Full                                                             +---------+---------------+---------+-----------+----------+-------------------+ PERO                                                  Not well visualized +---------+---------------+---------+-----------+----------+-------------------+   +---------+---------------+---------+-----------+----------+-------------------+ LEFT     CompressibilityPhasicitySpontaneityPropertiesThrombus Aging      +---------+---------------+---------+-----------+----------+-------------------+ CFV      Full           Yes      Yes                                      +---------+---------------+---------+-----------+----------+-------------------+ SFJ      Full                                                             +---------+---------------+---------+-----------+----------+-------------------+ FV Prox  Full                                                             +---------+---------------+---------+-----------+----------+-------------------+ FV Mid                  Yes      Yes                                      +---------+---------------+---------+-----------+----------+-------------------+ FV Distal               Yes      Yes                                      +---------+---------------+---------+-----------+----------+-------------------+ POP      Full           Yes      Yes                                      +---------+---------------+---------+-----------+----------+-------------------+ PTV      Full                                                             +---------+---------------+---------+-----------+----------+-------------------+  PERO                                                  Not well visualized  +---------+---------------+---------+-----------+----------+-------------------+    Summary: RIGHT: - There is no evidence of deep vein thrombosis in the lower extremity. However, portions of this examination were limited- see technologist comments above.  - No cystic structure found in the popliteal fossa.  LEFT: - There is no evidence of deep vein thrombosis in the lower extremity. However, portions of this examination were limited- see technologist comments above.  - No cystic structure found in the popliteal fossa.  *See table(s) above for measurements and observations.    Preliminary     EKG:  The EKG was personally reviewed and demonstrates:  afib Telemetry:  Telemetry was personally reviewed and demonstrates:    Relevant CV Studies:  ECHO: 12/09/19  1. Left ventricular ejection fraction, by estimation, is 65 to 70%. The  left ventricle has hyperdynamic function. The left ventricle has no  regional wall motion abnormalities. Left ventricular diastolic function  could not be evaluated. Left ventricular  diastolic function could not be evaluated.   2. Right ventricular systolic function is normal. The right ventricular  size is normal. There is moderately elevated pulmonary artery systolic  pressure. The estimated right ventricular systolic pressure is 80.8 mmHg.   3. Left atrial size was mildly dilated.   4. Right atrial size was mildly dilated.   5. The mitral valve is normal in structure. Mild mitral valve  regurgitation. No evidence of mitral stenosis.   6. The aortic valve is normal in structure. Aortic valve regurgitation is  not visualized. No aortic stenosis is present.   7. The inferior vena cava is normal in size with greater than 50%  respiratory variability, suggesting right atrial pressure of 3 mmHg.   CATH: 07/2018 Widely patent coronary arteries. 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  Courses between PA and AO.  Mild dynamic compression is  noted. Hypercontractile left ventricle.  Marked elevation in LVEDP, 26 mmHg.  EF greater than 65%. Ventricular fibrillation treated with defibrillation x1. Conversion to normal sinus rhythm after defibrillation.   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 Resume anticoagulation, likely Xarelto since coronaries are clean. Further management per treating team. Discontinue nitroglycerin IV.   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 Can check ESR/CRP. I do not appreciate any pericardial rub. I do not suspect ACS, although her trops are uptrending, EKG with afib. [place] 2019 showed no occlusive dx and has anomalous RCA from left cusp. - she has JVD, lung crackles, b/l leg swelling: HFpEF exacerbation: would recommend lasix 74m BID, goal UO 3lts  - if urine output is not met- consider lasix drip.  - resume home meds: norvasc 10, atorva 465m losartan 2562mxarelto 44m86mBegin metoprolol xl 25mg73mly. - check ECHO in am - non compliant on CPAP- recc CPAP at  night - morbid obesity - HTN, HLD.   Risk Assessment/Risk Scores:        New York Heart Association (NYHA) Functional Class NYHA Class III  CHA2DS2-VASc Score = 4  This indicates a 4.8% annual risk of stroke. The patient's score is based upon: CHF History: Yes HTN History: Yes Diabetes History: Yes Stroke History: No Vascular Disease History: No Age Score: 0 Gender Score: 1         For questions or updates, please contact [place] Please consult [email or web address] for contact info  under    Signed, Renae Fickle, MD  04/17/2021 10:56 PM

## 2021-04-17 NOTE — ED Triage Notes (Signed)
Pt arrived by [facility] from home. Reports constipation x 1 week and chest pain since 0900.recent pneumonia and treated with antibiotics. Cbg was 416 pta, given 579ml NS bolus.

## 2021-04-17 NOTE — ED Provider Notes (Signed)
[facility] EMERGENCY DEPARTMENT Provider Note   CSN: [id number] Arrival date & time: 04/17/21  1415     History Chief Complaint  Patient presents with   Chest Pain   Constipation    [name] is a 55 y.o. female.   Chest Pain Constipation  55yoF PMHx T2DM (metformin, glipizide), afib (adherent to xarelto regimen), HTN, ACS, HFpEF, pHTN w/ chronic cor pulmonale, OSA, p/w cp. Pain began 0900 when she was eating, is midsternal, radiating posteriorly, 9/10 severity, dull pressure-like quality, some relief w/ massaging, worse w/ breathing. Assc sx include shortness of breath, worsened calf edema after missing last 2d of lasix, chills, globus sensation. Additionally notes harder and smaller bm's x7d.  No further medical concerns at this time, including fevers, diaphoresis, sore throat, rhinorrhea, cough, palpitations, abdominal pain, N/V, changes to urination, focal paresthesias/weakness, headache.  History provided by patient and from chart review.     Past Medical History:  Diagnosis Date   Acute on chronic diastolic congestive heart failure ([place]) 11/02/2013   10/03/2015, 11/13/2015, 08/03/2017   Benign essential HTN 11/28/2013   Bipolar disease, chronic ([place])    Chest pain    a. 2012 Myoview: EF 63%, no isch/infarct;  b. 04/2016 Lexiscan MV: EF 73%, no ischemia/infarct-->Low risk.   Chronic diastolic CHF (congestive heart failure) ([place]) 07/23/2011   a. 2015 Echo: EF 55-60%, Gr2 DD;  b. 09/2015 Echo: EF 60-65%, no rwma, mod dil LA, PASP 55mHg.   Cor pulmonale (chronic) (HCC)    History of thyrotoxicosis    HTN (hypertension) 11/28/2013   Hypertensive heart disease 10/18/2013   Hypoglycemia    Insulin dependent type 2 diabetes mellitus, uncontrolled ([place]    Mediastinal adenopathy    Morbid obesity due to excess calories ([place] 02/19/2011   Morbid obesity with BMI of 50.0-59.9, adult (HCC)    OSA (obstructive sleep apnea) 03/06/2011   Persistent atrial  fibrillation ([place] 12/09/2017   Pulmonary HTN, moderate to severe 11/03/2013   Sinusitis, chronic 01/02/2015   SVT (supraventricular tachycardia) ([place] 12/06/2013   Uncontrolled type 2 diabetes mellitus with hyperglycemia ([facility]     Patient Active Problem List   Diagnosis Date Noted   Acute on chronic heart failure ([facility] 04/17/2021   Elevated troponin    Obesity hypoventilation syndrome ([place] 12/15/2019   Depression 12/15/2019   ILD (interstitial lung disease) ([place] 05/28/2019   Exertional angina ([place] 05/28/2019   AKI (acute kidney injury) ([place]    Vitamin B12 deficiency 07/20/2018   Hematochezia 07/14/2018   Acute posthemorrhagic anemia 07/14/2018   GIB (gastrointestinal bleeding) 07/14/2018   Chronic anticoagulation 07/14/2018   Persistent atrial fibrillation    Uncontrolled type 2 diabetes mellitus with hyperglycemia (HCC)    Hypokalemia    Cor pulmonale (chronic) (HCC)    Mycobacterium avium complex ([place] 12/13/2015   Pyrexia    Dyspnea 11/13/2015   Mediastinal adenopathy 11/13/2015   Abnormal CT scan, chest 11/11/2015   Chest pain 11/11/2015   Essential hypertension 03/07/2015   Depression (emotion) 03/07/2015   Noninfectious gastroenteritis and colitis 01/02/2015   Sinusitis, chronic 01/02/2015   Midline low back pain without sciatica 09/10/2014   Bipolar 1 disorder, mixed, moderate ([place] 07/02/2014   Stress incontinence 07/02/2014   Mania ([place] 12/10/2013   Speech abnormality 12/08/2013   SVT (supraventricular tachycardia) ([place] 12/06/2013   Benign essential HTN 11/28/2013   H/O non-insulin dependent diabetes mellitus 11/28/2013   Pulmonary HTN, moderate to severe 11/03/2013   Acute on chronic diastolic congestive heart  failure ([place]) 11/02/2013   Hypertensive heart disease 10/18/2013   Non-insulin dependent type 2 diabetes mellitus ([place]) 10/18/2013   Chronic diastolic heart failure ([place]) 07/23/2011   OSA (obstructive sleep apnea)- non compliant with C-pap 03/06/2011    Class 3 severe obesity due to excess calories with serious comorbidity and body mass index (BMI) of 60.0 to 69.9 in adult [facility]) 02/19/2011   Non-ST elevation (NSTEMI) myocardial infarction ([place]) 04/29/2009   Bipolar disorder     Past Surgical History:  Procedure Laterality Date   CARDIOVERSION N/A 04/05/2018   Procedure: CARDIOVERSION;  Surgeon: Lelon Perla, MD;  Location: [place];  Service: Cardiovascular;  Laterality: N/A;   COLONOSCOPY WITH PROPOFOL Left 07/16/2018   Procedure: COLONOSCOPY WITH PROPOFOL;  Surgeon: Ronnette Juniper, MD;  Location: [facility];  Service: Gastroenterology;  Laterality: Left;   LEFT HEART CATH AND CORONARY ANGIOGRAPHY N/A 08/04/2018   Procedure: LEFT HEART CATH AND CORONARY ANGIOGRAPHY;  Surgeon: Belva Crome, MD;  Location: [place] [facility];  Service: Cardiovascular;  Laterality: N/A;   None     POLYPECTOMY  07/16/2018   Procedure: POLYPECTOMY;  Surgeon: Ronnette Juniper, MD;  Location: [facility];  Service: Gastroenterology;;     OB History   No obstetric history on file.     Family History  Problem Relation Age of Onset   Heart failure Father    Stroke Father    Hypertension Mother    Heart disease Maternal Grandfather     Social History   Tobacco Use   Smoking status: Never   Smokeless tobacco: Never  Vaping Use   Vaping Use: Never used  Substance Use Topics   Alcohol use: No   Drug use: No    Home Medications Prior to Admission medications   Medication Sig Start Date End Date Taking? Authorizing Provider  Accu-Chek Softclix Lancets lancets USE AS DIRECTED TO TEST BLOOD SUGAR THREE TIMES DAILY 10/02/20 10/02/21  [name], NP  albuterol (VENTOLIN HFA) 108 (90 Base) MCG/ACT inhaler INHALE 1 TO 2 PUFFS EVERY 6 HOURS AS NEEDED FOR WHEEZING/ SHORTNESS OF BREATH Patient taking differently: Inhale 1-2 puffs into the lungs every 6 (six) hours as needed for wheezing or shortness of breath. 11/13/20 11/13/21  [name], NP   amLODipine (NORVASC) 10 MG tablet Take 1 tablet (10 mg total) by mouth daily. 04/15/21 05/15/21  Charlott Rakes, MD  atorvastatin (LIPITOR) 40 MG tablet TAKE 1 TABLET (40 MG TOTAL) BY MOUTH DAILY AT 6 PM. Patient not taking: No sig reported 07/02/20 07/02/21  [name], NP  azithromycin (ZITHROMAX) 250 MG tablet TAke 2 tablets by mouth for 1 day, then take 1 tablet daily for 4 days. 03/29/21   Levin Bacon, MD  Blood Glucose Monitoring Suppl (ACCU-CHEK AVIVA PLUS) w/Device KIT USE AS DIRECTED TO CHECK BLOOD SUGAR ONCE DAILY 10/02/20 10/02/21  [name], NP  Blood Pressure Monitor DEVI Please provide patient with insurance approved blood pressure monitor I10.0 07/21/20   [name], NP  carvedilol (COREG) 25 MG tablet TAKE 1 TABLET (25 MG TOTAL) BY MOUTH 2 (TWO) TIMES DAILY WITH A MEAL. Patient taking differently: Take 25 mg by mouth 2 (two) times daily with a meal. 02/05/21 02/05/22  [name], NP  cephALEXin (KEFLEX) 500 MG capsule Take 1 capsule by mouth 3 times daily for 5 days. 03/29/21   Levin Bacon, MD  Dulaglutide 1.5 MG/0.5ML SOPN INJECT 1.5 MG INTO THE SKIN ONCE A WEEK. 10/31/20 10/31/21  Charlott Rakes, MD  escitalopram (LEXAPRO) 10 MG tablet TAKE 1 TABLET (10 MG TOTAL) BY MOUTH DAILY. Patient not taking: No sig reported 10/02/20 10/02/21  [name], NP  escitalopram (LEXAPRO) 20 MG tablet TAKE 1 TABLET BY MOUTH EVERY MORNING Patient taking differently: Take 20 mg by mouth every morning. 09/02/20 09/02/21  Scism, Dani Gobble, FNP  ferrous sulfate (FEROSUL) 325 (65 FE) MG tablet TAKE 1 TABLET (325 MG TOTAL) BY MOUTH 2 (TWO) TIMES DAILY. Patient taking differently: Take 325 mg by mouth daily. 01/17/21 01/17/22  [name], NP  glipiZIDE (GLUCOTROL XL) 10 MG 24 hr tablet TAKE 1 TABLET (10 MG TOTAL) BY MOUTH EVERY EVENING. Patient taking differently: Take 10 mg by mouth daily with breakfast. 10/02/20 10/02/21  [name], NP  glucose blood test strip USE  AS DIRECTED TO TEST BLOOD SUGAR THREE TIMES DAILY Patient not taking: No sig reported 10/02/20 10/02/21  [name], NP  glucose blood test strip USE AS DIRECTED TO TEST BLOOD SUGAR ONCE DAILY 10/02/20 10/02/21  [name], NP  Incontinence Supply Disposable (INCONTINENCE BRIEF LARGE) MISC Please provide patient with insurance approved incontinence supplies/briefs 07/02/20   [name], NP  ketorolac (TORADOL) 10 MG tablet Take 1 tablet (10 mg total) by mouth every 6 (six) hours as needed as needed for pain Patient not taking: No sig reported 01/01/21   Argentina Donovan, PA-C  metFORMIN (GLUCOPHAGE) 1000 MG tablet Take 1 tablet (1,000 mg total) by mouth 2 (two) times daily with a meal. 04/08/21 05/14/21  Charlott Rakes, MD  Misc. Devices MISC Please provide BiPAP machine with the following: Settings 22/18 cm H2O. Medium  size [employer] Full Face Mask Simplus mask and heated  humidification. 02/28/20   [name], NP  Misc. Devices MISC Please provide patient a Blood Pressure Monitor w/ insurance approval. 07/18/20   [name], NP  Naphazoline-Pheniramine (EYE ALLERGY RELIEF OP) Place 1 drop into both eyes daily as needed (allergy).    [provider]  oxybutynin (DITROPAN) 5 MG tablet Take 5 mg by mouth 3 (three) times daily.    [provider]  Potassium Chloride ER 20 MEQ TBCR TAKE 1 TABLET BY MOUTH TWICE DAILY. Patient taking differently: Take 20 tablets by mouth 2 (two) times daily. 07/02/20 07/02/21  [name], NP  risperidone (RISPERDAL) 4 MG tablet TAKE 1 TABLET (4 MG TOTAL) BY MOUTH DAILY. Patient taking differently: Take 4 mg by mouth daily. 11/08/20 11/08/21  Charlott Rakes, MD  rivaroxaban (XARELTO) 20 MG TABS tablet TAKE 1 TABLET (20 MG TOTAL) BY MOUTH DAILY WITH SUPPER. 04/15/21 04/15/22  Charlott Rakes, MD  torsemide (DEMADEX) 20 MG tablet TAKE 4 TABLETS (80 MG TOTAL) BY MOUTH 2 (TWO) TIMES DAILY. Patient taking differently: Take 80 mg by  mouth 2 (two) times daily. 08/07/20 08/07/21  Lelon Perla, MD  Vitamin D, Ergocalciferol, (DRISDOL) 1.25 MG (50000 UNIT) CAPS capsule Take 1 capsule (50,000 Units total) by mouth every 7 (seven) days. Patient not taking: No sig reported 03/19/20   [name], NP  furosemide (LASIX) 80 MG tablet Take 1 tablet (80 mg total) by mouth in the morning, at noon, and at bedtime. 12/19/19 12/30/19  Allie Bossier, MD  losartan (COZAAR) 25 MG tablet Take 0.5 tablets (12.5 mg total) by mouth daily. 07/09/20 08/07/20  [name], NP  sitaGLIPtin (JANUVIA) 100 MG tablet Take 1 tablet (100 mg total) by mouth daily.  10/02/20 10/24/20  [name], NP    Allergies    Acetaminophen, Caffeine, Farxiga [dapagliflozin], and Lisinopril  Review of Systems   Review of Systems  Cardiovascular:  Positive for chest pain.  Gastrointestinal:  Positive for constipation.  All other systems reviewed and are negative.  Physical Exam Updated Vital Signs BP (!) 152/112   Pulse 97   Temp 99 F (37.2 C) (Oral)   Resp 20   SpO2 90%   Physical Exam Vitals and nursing note reviewed.  Constitutional:      General: She is not in acute distress. HENT:     Head: Normocephalic and atraumatic.  Eyes:     Extraocular Movements: Extraocular movements intact.     Pupils: Pupils are equal, round, and reactive to light.  Neck:     Trachea: No tracheal deviation.  Cardiovascular:     Rate and Rhythm: Normal rate. Rhythm irregular.     Heart sounds: Murmur heard.  Pulmonary:     Effort: Pulmonary effort is normal.     Breath sounds: Rales (scattered) present. No decreased breath sounds, wheezing or rhonchi.  Chest:     Chest wall: No tenderness or edema.  Abdominal:     Tenderness: There is no abdominal tenderness. There is no guarding or rebound.  Musculoskeletal:     Cervical back: Normal range of motion.     Right lower leg: No tenderness. Edema present.     Left lower leg: No tenderness. Edema  present.  Skin:    General: Skin is warm and dry.  Neurological:     General: No focal deficit present.     Mental Status: She is alert and oriented to person, place, and time.  Psychiatric:        Mood and Affect: Mood normal.        Behavior: Behavior normal.    ED Results / Procedures / Treatments   Labs (all labs ordered are listed, but only abnormal results are displayed) Labs Reviewed  BASIC METABOLIC PANEL - Abnormal; Notable for the following components:      Result Value   Potassium 3.2 (*)    Glucose, Bld 296 (*)    All other components within normal limits  BRAIN NATRIURETIC PEPTIDE - Abnormal; Notable for the following components:   B Natriuretic Peptide 246.1 (*)    All other components within normal limits  CBC WITH DIFFERENTIAL/PLATELET - Abnormal; Notable for the following components:   Hemoglobin 11.9 (*)    All other components within normal limits  HEMOGLOBIN A1C - Abnormal; Notable for the following components:   Hgb A1c MFr Bld 9.7 (*)    All other components within normal limits  CBG MONITORING, ED - Abnormal; Notable for the following components:   Glucose-Capillary 148 (*)    All other components within normal limits  CBG MONITORING, ED - Abnormal; Notable for the following components:   Glucose-Capillary 214 (*)    All other components within normal limits  TROPONIN I (HIGH SENSITIVITY) - Abnormal; Notable for the following components:   Troponin I (High Sensitivity) 34 (*)    All other components within normal limits  TROPONIN I (HIGH SENSITIVITY) - Abnormal; Notable for the following components:   Troponin I (High Sensitivity) 193 (*)    All other components within normal limits  RESP PANEL BY RT-PCR (FLU A&B, COVID) ARPGX2  HIV ANTIBODY (ROUTINE TESTING W REFLEX)  COMPREHENSIVE METABOLIC PANEL  CBC  MAGNESIUM  I-STAT BETA HCG BLOOD,  ED ([facility], [facility], [facility] ONLY)  TROPONIN I (HIGH SENSITIVITY)    EKG None  Radiology DG Chest 2 View  Result  Date: 04/17/2021 CLINICAL DATA:  Chest pain. EXAM: CHEST - 2 VIEW COMPARISON:  Chest radiograph 04/02/2021 and CTA 04/03/2021 FINDINGS: Enlargement of the cardiac silhouette is unchanged. There is persistent pulmonary vascular congestion with bilateral interstitial and hazy airspace opacities, stable to slightly increased compared to the prior radiographs. No pleural effusion or pneumothorax is identified. No acute osseous abnormality is seen. IMPRESSION: Cardiomegaly and mild pulmonary edema. Electronically Signed   By: Logan Bores M.D.   On: 04/17/2021 15:42   VAS [place] LOWER EXTREMITY VENOUS (DVT) (ONLY [facility] & [facility])  Result Date: 04/17/2021  Lower Venous DVT Study Patient Name:  JEANANN BALINSKI  Date of Exam:   04/17/2021 Medical Rec #: [id number]      Accession #:    [id number] Date of Birth: 04-28-1966      Patient Gender: F Patient Age:   89X Exam Location:  [facility] Procedure:      VAS [place] LOWER EXTREMITY VENOUS (DVT) Referring Phys: [id number] Marcello Fennel --------------------------------------------------------------------------------  Indications: Swelling, and Edema.  Limitations: Body habitus and poor ultrasound/tissue interface. Comparison Study: no prior Performing Technologist: Archie Patten RVS  Examination Guidelines: A complete evaluation includes B-mode imaging, spectral Doppler, color Doppler, and power Doppler as needed of all accessible portions of each vessel. Bilateral testing is considered an integral part of a complete examination. Limited examinations for reoccurring indications may be performed as noted. The reflux portion of the exam is performed with the patient in reverse Trendelenburg.  +---------+---------------+---------+-----------+----------+-------------------+ RIGHT    CompressibilityPhasicitySpontaneityPropertiesThrombus Aging      +---------+---------------+---------+-----------+----------+-------------------+ CFV      Full           Yes      Yes                                       +---------+---------------+---------+-----------+----------+-------------------+ SFJ      Full                                                             +---------+---------------+---------+-----------+----------+-------------------+ FV Prox  Full                                                             +---------+---------------+---------+-----------+----------+-------------------+ FV Mid                  Yes      Yes                                      +---------+---------------+---------+-----------+----------+-------------------+ FV Distal               Yes      Yes                                      +---------+---------------+---------+-----------+----------+-------------------+  PFV      Full                                                             +---------+---------------+---------+-----------+----------+-------------------+ POP      Full           Yes      Yes                                      +---------+---------------+---------+-----------+----------+-------------------+ PTV      Full                                                             +---------+---------------+---------+-----------+----------+-------------------+ PERO                                                  Not well visualized +---------+---------------+---------+-----------+----------+-------------------+   +---------+---------------+---------+-----------+----------+-------------------+ LEFT     CompressibilityPhasicitySpontaneityPropertiesThrombus Aging      +---------+---------------+---------+-----------+----------+-------------------+ CFV      Full           Yes      Yes                                      +---------+---------------+---------+-----------+----------+-------------------+ SFJ      Full                                                              +---------+---------------+---------+-----------+----------+-------------------+ FV Prox  Full                                                             +---------+---------------+---------+-----------+----------+-------------------+ FV Mid                  Yes      Yes                                      +---------+---------------+---------+-----------+----------+-------------------+ FV Distal               Yes      Yes                                      +---------+---------------+---------+-----------+----------+-------------------+ POP      Full  Yes      Yes                                      +---------+---------------+---------+-----------+----------+-------------------+ PTV      Full                                                             +---------+---------------+---------+-----------+----------+-------------------+ PERO                                                  Not well visualized +---------+---------------+---------+-----------+----------+-------------------+    Summary: RIGHT: - There is no evidence of deep vein thrombosis in the lower extremity. However, portions of this examination were limited- see technologist comments above.  - No cystic structure found in the popliteal fossa.  LEFT: - There is no evidence of deep vein thrombosis in the lower extremity. However, portions of this examination were limited- see technologist comments above.  - No cystic structure found in the popliteal fossa.  *See table(s) above for measurements and observations.    Preliminary     Procedures Procedures   Medications Ordered in ED Medications  nitroGLYCERIN (NITROSTAT) SL tablet 0.4 mg (0.4 mg Sublingual Given 04/17/21 2036)  amLODipine (NORVASC) tablet 10 mg (has no administration in time range)  risperiDONE (RISPERDAL) tablet 4 mg (has no administration in time range)  oxybutynin (DITROPAN) tablet 5 mg (5 mg Oral Given 04/17/21 2348)   furosemide (LASIX) injection 80 mg (has no administration in time range)  bisacodyl (DULCOLAX) EC tablet 5 mg (has no administration in time range)  senna-docusate (Senokot-S) tablet 1 tablet (has no administration in time range)  insulin aspart (novoLOG) injection 0-20 Units (has no administration in time range)  insulin glargine (LANTUS) injection 15 Units (15 Units Subcutaneous Given 04/17/21 2353)  insulin aspart (novoLOG) injection 0-5 Units (2 Units Subcutaneous Given 04/17/21 2352)  insulin aspart (novoLOG) injection 4 Units (has no administration in time range)  carvedilol (COREG) tablet 25 mg (25 mg Oral Given 04/17/21 2348)  rivaroxaban (XARELTO) tablet 20 mg (has no administration in time range)  losartan (COZAAR) tablet 25 mg (has no administration in time range)  aspirin chewable tablet 324 mg (324 mg Oral Given 04/17/21 2034)  potassium chloride SA (KLOR-CON) CR tablet 40 mEq (40 mEq Oral Given 04/17/21 2034)  furosemide (LASIX) injection 80 mg (80 mg Intravenous Given 04/17/21 2148)    ED Course  I have reviewed the triage vital signs and the nursing notes.  Pertinent labs & imaging results that were available during my care of the patient were reviewed by me and considered in my medical decision making (see chart for details).    MDM Rules/Calculators/A&P                          This is a 54 year old female PMHx T2DM (metformin, glipizide), afib (xarelto), HTN, ACS, HFpEF, pHTN w/ chronic cor pulmonale, OSA, presenting for chest heaviness, bilateral pedal edema, shortness of breath, orthopnea after missing 2 days of Lasix.  She was recently diagnosed  with pneumonia, which was treated with antibiotics and is currently afebrile.  Vital signs stable.  Appears fluid overloaded on exam.  Initial interventions: Triage labs revealed troponin 34->193, mild hypokalemia, so ASA 3.4 mg and p.o. Klor-Con administered  All studies independently reviewed by myself, d/w the attending  physician, factored into my MDM. -EKG 1430: A. fib 95 bpm, normal axis, normal intervals, no acute ST-T changes, essentially unchanged compared to prior from 04/03/2021 -EKG 2027: A. fib 86 bpm, normal axis, normal intervals, no acute ST-T changes, unchanged from prior -CXR: Cardiomegaly with pulmonary edema -CMP: K+ 3.2 -Troponin: 34->193 -BNP: 246.1 (baseline) -Unremarkable: Venous duplex BLE, CBCd, COVID/influenza PCR  Presentation appears most consistent with acute on chronic heart failure, with likely demand ischemia.  Unlikely ACS, pericarditis, or arrhythmia; only modest troponin elevation, no red flag EKG changes.  Unlikely PE; despite pleuritic chest pain, no hypoxia, tachycardia, or coughing.  Unlikely dissection; despite posteriorly rating chest pain, is not tearing, she is not an extremis, CXR without mediastinal widening.  Unlikely tamponade; HDS, no explained tamponade physiology.  No imaging evidence of pneumonia, PTX, effusion, or pneumoperitoneum; no cough fever or leukocytosis, lungs without any localizing findings or wheeze with good air movement bilaterally.  Less likely GI etiology, no GI symptoms.  Nonreproducible on palpation, suggests against MSK CP.  Appearing calm, unlikely panic attack.  For, feel patient requires admission.  Discussed with IM who agree to admit.  Plan was discussed with patient understands and agrees.  Patient HDS, nontoxic on reevaluation, subsequently admitted.  Final Clinical Impression(s) / ED Diagnoses Final diagnoses:  None    Rx / DC Orders ED Discharge Orders     None        Levin Bacon, MD 04/18/21 0139    Lucrezia Starch, MD 04/19/21 2030

## 2021-04-17 NOTE — ED Notes (Signed)
Per Dr. Roslynn Amble pt can eat and drink - pt given [place] sandwich bag and diet ginger ale

## 2021-04-17 NOTE — H&P (Signed)
Date: 04/18/2021               Patient Name:  [name] MRN: [id number]  DOB: 1966/06/01 Age / Sex: 55 y.o., female   PCP: [name], NP         Medical Service: Internal Medicine Teaching Service         Attending Physician: Dr. Lucious Groves, DO    First Contact: Dr. Delene Ruffini Pager: [phone]  Second Contact: Dr. Harvie Heck  Pager: [phone]       After Hours (After 5p/  First Contact Pager: [phone]  weekends / holidays): Second Contact Pager: [phone]   Chief Complaint: "I am having chest pain since this morning"  History of Present Illness: Ms. Forgione is a 55 yo F with PMH of HFpEF (EF 65-70% from 12/09/19), HTN, DM2, morbid obesity, OSA on CPAP who presents to the ED with midsternal 7/10 CP that started at 9 am this AM. The pain was initially 10/10, intermittently radiates to the shoulder, dull pressure like quality which is aggravated by breathing and improves with sitting up. At home, pt sleeps in a recliner using 3 pillows. Pt states that she intermittently has this type of CP for years, causing her to visit the ED often. Pt stopped taking lasix for last 2 days because of frequent urination, and also said she has been drinking more water recently despite being advised in the past to restrict fluids.   Associated symptoms includes SOB, cough, constipation, and bilaterally leg swelling. Pt denies N/V/D, fever, and diaphoresis.  ED course: BP 165/108, 95% on RA, K 3.2, BNP 246, Trop 34-> 193 EKG showing Afib  CXR shows cardiomegaly and pulm edema She got aspirin, nitro, IV lasix 38m x1 and kdur 485m.  Meds:  No current facility-administered medications on file prior to encounter.   Current Outpatient Medications on File Prior to Encounter  Medication Sig Dispense Refill   Accu-Chek Softclix Lancets lancets USE AS DIRECTED TO TEST BLOOD SUGAR THREE TIMES DAILY 200 each 12   albuterol (VENTOLIN HFA) 108 (90 Base) MCG/ACT inhaler INHALE 1 TO 2 PUFFS  EVERY 6 HOURS AS NEEDED FOR WHEEZING/ SHORTNESS OF BREATH (Patient taking differently: Inhale 1-2 puffs into the lungs every 6 (six) hours as needed for wheezing or shortness of breath.) 8.5 g 2   amLODipine (NORVASC) 10 MG tablet Take 1 tablet (10 mg total) by mouth daily. 30 tablet 0   atorvastatin (LIPITOR) 40 MG tablet TAKE 1 TABLET (40 MG TOTAL) BY MOUTH DAILY AT 6 PM. (Patient not taking: No sig reported) 90 tablet 2   azithromycin (ZITHROMAX) 250 MG tablet TAke 2 tablets by mouth for 1 day, then take 1 tablet daily for 4 days. 6 tablet 0   Blood Glucose Monitoring Suppl (ACCU-CHEK AVIVA PLUS) w/Device KIT USE AS DIRECTED TO CHECK BLOOD SUGAR ONCE DAILY 1 kit 0   Blood Pressure Monitor DEVI Please provide patient with insurance approved blood pressure monitor I10.0 1 each 0   carvedilol (COREG) 25 MG tablet TAKE 1 TABLET (25 MG TOTAL) BY MOUTH 2 (TWO) TIMES DAILY WITH A MEAL. (Patient taking differently: Take 25 mg by mouth 2 (two) times daily with a meal.) 180 tablet 0   cephALEXin (KEFLEX) 500 MG capsule Take 1 capsule by mouth 3 times daily for 5 days. 15 capsule 0   Dulaglutide 1.5 MG/0.5ML SOPN INJECT 1.5 MG INTO THE SKIN ONCE A WEEK. 2 mL 2   escitalopram (LEXAPRO)  10 MG tablet TAKE 1 TABLET (10 MG TOTAL) BY MOUTH DAILY. (Patient not taking: No sig reported) 90 tablet 1   escitalopram (LEXAPRO) 20 MG tablet TAKE 1 TABLET BY MOUTH EVERY MORNING (Patient taking differently: Take 20 mg by mouth every morning.) 30 tablet 2   ferrous sulfate (FEROSUL) 325 (65 FE) MG tablet TAKE 1 TABLET (325 MG TOTAL) BY MOUTH 2 (TWO) TIMES DAILY. (Patient taking differently: Take 325 mg by mouth daily.) 180 tablet 0   glipiZIDE (GLUCOTROL XL) 10 MG 24 hr tablet TAKE 1 TABLET (10 MG TOTAL) BY MOUTH EVERY EVENING. (Patient taking differently: Take 10 mg by mouth daily with breakfast.) 90 tablet 1   glucose blood test strip USE AS DIRECTED TO TEST BLOOD SUGAR THREE TIMES DAILY (Patient not taking: No sig reported)  200 strip 12   glucose blood test strip USE AS DIRECTED TO TEST BLOOD SUGAR ONCE DAILY 100 strip 12   Incontinence Supply Disposable (INCONTINENCE BRIEF LARGE) MISC Please provide patient with insurance approved incontinence supplies/briefs 18 each 6   ketorolac (TORADOL) 10 MG tablet Take 1 tablet (10 mg total) by mouth every 6 (six) hours as needed as needed for pain (Patient not taking: No sig reported) 20 tablet 0   metFORMIN (GLUCOPHAGE) 1000 MG tablet Take 1 tablet (1,000 mg total) by mouth 2 (two) times daily with a meal. 60 tablet 0   Misc. Devices MISC Please provide BiPAP machine with the following: Settings 22/18 cm H2O. Medium  size [employer] Full Face Mask Simplus mask and heated  humidification. 1 each 0   Misc. Devices MISC Please provide patient a Blood Pressure Monitor w/ insurance approval. 1 Device 0   Naphazoline-Pheniramine (EYE ALLERGY RELIEF OP) Place 1 drop into both eyes daily as needed (allergy).     oxybutynin (DITROPAN) 5 MG tablet Take 5 mg by mouth 3 (three) times daily.     Potassium Chloride ER 20 MEQ TBCR TAKE 1 TABLET BY MOUTH TWICE DAILY. (Patient taking differently: Take 20 tablets by mouth 2 (two) times daily.) 60 tablet 11   risperidone (RISPERDAL) 4 MG tablet TAKE 1 TABLET (4 MG TOTAL) BY MOUTH DAILY. (Patient taking differently: Take 4 mg by mouth daily.) 90 tablet 1   rivaroxaban (XARELTO) 20 MG TABS tablet TAKE 1 TABLET (20 MG TOTAL) BY MOUTH DAILY WITH SUPPER. 30 tablet 0   torsemide (DEMADEX) 20 MG tablet TAKE 4 TABLETS (80 MG TOTAL) BY MOUTH 2 (TWO) TIMES DAILY. (Patient taking differently: Take 80 mg by mouth 2 (two) times daily.) 240 tablet 11   Vitamin D, Ergocalciferol, (DRISDOL) 1.25 MG (50000 UNIT) CAPS capsule Take 1 capsule (50,000 Units total) by mouth every 7 (seven) days. (Patient not taking: No sig reported) 12 capsule 1   [DISCONTINUED] furosemide (LASIX) 80 MG tablet Take 1 tablet (80 mg total) by mouth in the morning, at noon, and at  bedtime. 90 tablet 0   [DISCONTINUED] losartan (COZAAR) 25 MG tablet Take 0.5 tablets (12.5 mg total) by mouth daily. 45 tablet 0   [DISCONTINUED] sitaGLIPtin (JANUVIA) 100 MG tablet Take 1 tablet (100 mg total) by mouth daily. 30 tablet 1    Allergies: Allergies as of 04/17/2021 - Review Complete 04/17/2021  Allergen Reaction Noted   Acetaminophen Other (See Comments) 12/22/2010   Caffeine  12/22/2010   Farxiga [dapagliflozin] Other (See Comments) 08/03/2017   Lisinopril Rash 02/26/2011   Past Medical History:  Diagnosis Date   Acute on chronic diastolic congestive heart  failure ([place]) 11/02/2013   10/03/2015, 11/13/2015, 08/03/2017   Benign essential HTN 11/28/2013   Bipolar disease, chronic ([place])    Chest pain    a. 2012 Myoview: EF 63%, no isch/infarct;  b. 04/2016 Lexiscan MV: EF 73%, no ischemia/infarct-->Low risk.   Chronic diastolic CHF (congestive heart failure) ([place]) 07/23/2011   a. 2015 Echo: EF 55-60%, Gr2 DD;  b. 09/2015 Echo: EF 60-65%, no rwma, mod dil LA, PASP 29mHg.   Cor pulmonale (chronic) (HCC)    History of thyrotoxicosis    HTN (hypertension) 11/28/2013   Hypertensive heart disease 10/18/2013   Hypoglycemia    Insulin dependent type 2 diabetes mellitus, uncontrolled ([facility]    Mediastinal adenopathy    Morbid obesity due to excess calories ([facility] 02/19/2011   Morbid obesity with BMI of 50.0-59.9, adult (HCC)    OSA (obstructive sleep apnea) 03/06/2011   Persistent atrial fibrillation ([facility] 12/09/2017   Pulmonary HTN, moderate to severe 11/03/2013   Sinusitis, chronic 01/02/2015   SVT (supraventricular tachycardia) ([facility] 12/06/2013   Uncontrolled type 2 diabetes mellitus with hyperglycemia ([facility]     Family History: Father- HF, stroke Mother- HTN  Social History:  Pt lives with her mom, does not work, and denies tobacco, alcohol, and drug use.   Review of Systems: A complete ROS was negative except as per HPI.   Physical Exam: Blood pressure (!) 152/112, pulse 97,  temperature 99 F (37.2 C), temperature source Oral, resp. rate 20, SpO2 90 %.  Physical Exam Constitutional:      General: She is in acute distress.  Eyes:     Extraocular Movements: Extraocular movements intact.     Pupils: Pupils are equal, round, and reactive to light.  Neck:     Comments: JVD upper neck Cardiovascular:     Rate and Rhythm: Normal rate and regular rhythm.     Heart sounds: Normal heart sounds.  Pulmonary:     Breath sounds: No wheezing.     Comments: Bilateral crackles  Abdominal:     General: There is no distension.     Palpations: Abdomen is soft.  Musculoskeletal:     Right lower leg: Edema present.     Left lower leg: Edema present.  Neurological:     General: No focal deficit present.     Mental Status: She is alert and oriented to person, place, and time.    EKG: personally reviewed my interpretation is Afib  CXR: personally reviewed my interpretation is moderate pulmonary edema and cardiomegaly.    Assessment & Plan by Problem: Principal Problem:   Acute on chronic heart failure (HCC) Active Problems:   Class 3 severe obesity due to excess calories with serious comorbidity and body mass index (BMI) of 60.0 to 69.9 in adult ([facility]   Ms. KBulleris a 55yo F with PMH of HFpEF (EF 65-70%), HTN, DM2, morbid obesity, OSA on CPAP who presents with chest pain and SOB from acute on chronic heart failure exacerbation.   Chest pain Acute on chronic heart failure exacerbation Chest pain likely secondary from acute on chronic HF exacerbation. Troponins only mildly elevated (34 -> 193) and I expect this is a type 2 NTEMI. Pt last heart cath showed no signs of significant CAD.  Pt has longstanding hx of chronic diastolic congestive heart failure, HFpEF with EF 65-70% (12/09/19), obesity and OSA on CPAP. Pt noncompliant with fluid restriction and missed 2 days of home lasix. BNP 246; last exacerbation on 7/6 BNP 261.  BUN 17, Cr 0.77 (baseline  0.77-1.03) -Echo for current EF and determine any structural abnormalities   -Lasix 80 BID (home dose) -Daily weights and I&Os   Diabetes mellitus type 2  Hg A1c 9.7, previous 9.8 (10/12/20) Home regimin includes dulaglutide, glipizide and metformin. -Hold home meds  -4 units of mealtimes along with resistant sliding scale and 15 units Lantus QHS  Atrial fibrillation  Continue Xarelto and Coreg   HTN -continue home meds amlodipine 10, carvedilol 25, losartan 25  Electrolyte abnormality   -K 3.2, kdur 40mq given in ED. Continue to monitor and replete if necessary. -monitor Mg, and replete with Mg sulfate IV if necessary.    Dispo: Admit patient to Observation with expected length of stay less than 2 midnights.  Signed: PLajean Manes MD 04/18/2021, 1:46 AM  Internal Medicine Resident, PGY-1 Pager: [phone] 5pm on weekdays and 1pm on weekends: On Call pager: [phone]

## 2021-04-17 NOTE — ED Provider Notes (Signed)
Emergency Medicine Provider Triage Evaluation Note  [name] , a 55 y.o. female  was evaluated in triage.  Pt complains of chest pain as well as bilateral leg pain.  Patient states chest pain started around 9 AM this morning, states the pain is in the middle of her chest and will radiate up into her left shoulder, has no associated shortness of breath, becoming diaphoretic, nausea or vomiting.  She also notes that she has bilateral leg swelling, pain has been going on for last week, states she has pain in her calfs.  Has no other complaints..  Review of Systems  Positive: Chest pain, calf pain Negative: Shortness of breath, fevers  Physical Exam  BP (!) 165/108   Pulse 96   Temp 99 F (37.2 C) (Oral)   Resp (!) 22   SpO2 95%  Gen:   Awake, no distress   Resp:  Normal effort  MSK:   Moves extremities without difficulty  Other:  Pedal edema 2+ symmetric, calf pain bilaterally.  Medical Decision Making  Medically screening exam initiated at 2:45 PM.  Appropriate orders placed.  QUIQN [place] was informed that the remainder of the evaluation will be completed by another provider, this initial triage assessment does not replace that evaluation, and the importance of remaining in the ED until their evaluation is complete.  Chest pain, shortness of breath, lab work and imaging been ordered, patient need further work-up.   Marcello Fennel, PA-C 04/17/21 1446    Tegeler, Gwenyth Allegra, MD 04/17/21 [phone]

## 2021-04-17 NOTE — ED Notes (Signed)
Pt told this tech she wears oxygen all the time at home, this tech placed pt on 2L of O2, RN aware.

## 2021-04-17 NOTE — ED Notes (Signed)
Pt states her pain is mild - refuses nitro at this time

## 2021-04-17 NOTE — ED Notes (Signed)
ED PA made aware of pt troponin. Pt to be prioritized for a room.

## 2021-04-18 ENCOUNTER — Observation Stay (HOSPITAL_COMMUNITY): Payer: Medicaid Other

## 2021-04-18 DIAGNOSIS — I248 Other forms of acute ischemic heart disease: Secondary | ICD-10-CM | POA: Diagnosis not present

## 2021-04-18 DIAGNOSIS — Z79899 Other long term (current) drug therapy: Secondary | ICD-10-CM | POA: Diagnosis not present

## 2021-04-18 DIAGNOSIS — I5033 Acute on chronic diastolic (congestive) heart failure: Secondary | ICD-10-CM | POA: Diagnosis not present

## 2021-04-18 DIAGNOSIS — Z20822 Contact with and (suspected) exposure to covid-19: Secondary | ICD-10-CM | POA: Diagnosis not present

## 2021-04-18 DIAGNOSIS — I071 Rheumatic tricuspid insufficiency: Secondary | ICD-10-CM | POA: Diagnosis not present

## 2021-04-18 DIAGNOSIS — E876 Hypokalemia: Secondary | ICD-10-CM | POA: Diagnosis not present

## 2021-04-18 DIAGNOSIS — I509 Heart failure, unspecified: Secondary | ICD-10-CM | POA: Diagnosis not present

## 2021-04-18 DIAGNOSIS — F319 Bipolar disorder, unspecified: Secondary | ICD-10-CM | POA: Diagnosis not present

## 2021-04-18 DIAGNOSIS — Z9111 Patient's noncompliance with dietary regimen: Secondary | ICD-10-CM | POA: Diagnosis not present

## 2021-04-18 DIAGNOSIS — I2729 Other secondary pulmonary hypertension: Secondary | ICD-10-CM | POA: Diagnosis not present

## 2021-04-18 DIAGNOSIS — J811 Chronic pulmonary edema: Secondary | ICD-10-CM | POA: Diagnosis not present

## 2021-04-18 DIAGNOSIS — I110 Hypertensive heart disease with heart failure: Secondary | ICD-10-CM | POA: Diagnosis not present

## 2021-04-18 DIAGNOSIS — Z8249 Family history of ischemic heart disease and other diseases of the circulatory system: Secondary | ICD-10-CM | POA: Diagnosis not present

## 2021-04-18 DIAGNOSIS — Z7901 Long term (current) use of anticoagulants: Secondary | ICD-10-CM | POA: Diagnosis not present

## 2021-04-18 DIAGNOSIS — Z888 Allergy status to other drugs, medicaments and biological substances status: Secondary | ICD-10-CM | POA: Diagnosis not present

## 2021-04-18 DIAGNOSIS — E785 Hyperlipidemia, unspecified: Secondary | ICD-10-CM | POA: Diagnosis not present

## 2021-04-18 DIAGNOSIS — Z6841 Body Mass Index (BMI) 40.0 and over, adult: Secondary | ICD-10-CM

## 2021-04-18 DIAGNOSIS — Q245 Malformation of coronary vessels: Secondary | ICD-10-CM | POA: Diagnosis not present

## 2021-04-18 DIAGNOSIS — G4733 Obstructive sleep apnea (adult) (pediatric): Secondary | ICD-10-CM | POA: Diagnosis not present

## 2021-04-18 DIAGNOSIS — I5022 Chronic systolic (congestive) heart failure: Secondary | ICD-10-CM | POA: Diagnosis not present

## 2021-04-18 DIAGNOSIS — R778 Other specified abnormalities of plasma proteins: Secondary | ICD-10-CM | POA: Diagnosis not present

## 2021-04-18 DIAGNOSIS — Z7984 Long term (current) use of oral hypoglycemic drugs: Secondary | ICD-10-CM | POA: Diagnosis not present

## 2021-04-18 DIAGNOSIS — Z9102 Food additives allergy status: Secondary | ICD-10-CM | POA: Diagnosis not present

## 2021-04-18 DIAGNOSIS — R079 Chest pain, unspecified: Secondary | ICD-10-CM | POA: Diagnosis not present

## 2021-04-18 DIAGNOSIS — Z886 Allergy status to analgesic agent status: Secondary | ICD-10-CM | POA: Diagnosis not present

## 2021-04-18 DIAGNOSIS — E669 Obesity, unspecified: Secondary | ICD-10-CM | POA: Diagnosis not present

## 2021-04-18 DIAGNOSIS — I517 Cardiomegaly: Secondary | ICD-10-CM | POA: Diagnosis not present

## 2021-04-18 DIAGNOSIS — E119 Type 2 diabetes mellitus without complications: Secondary | ICD-10-CM | POA: Diagnosis not present

## 2021-04-18 DIAGNOSIS — I4819 Other persistent atrial fibrillation: Secondary | ICD-10-CM | POA: Diagnosis not present

## 2021-04-18 DIAGNOSIS — Z9114 Patient's other noncompliance with medication regimen: Secondary | ICD-10-CM | POA: Diagnosis not present

## 2021-04-18 DIAGNOSIS — I5043 Acute on chronic combined systolic (congestive) and diastolic (congestive) heart failure: Secondary | ICD-10-CM | POA: Diagnosis not present

## 2021-04-18 LAB — CBG MONITORING, ED: Glucose-Capillary: 203 mg/dL — ABNORMAL HIGH (ref 70–99)

## 2021-04-18 LAB — COMPREHENSIVE METABOLIC PANEL
ALT: 11 U/L (ref 0–44)
AST: 21 U/L (ref 15–41)
Albumin: 3.3 g/dL — ABNORMAL LOW (ref 3.5–5.0)
Alkaline Phosphatase: 116 U/L (ref 38–126)
Anion gap: 12 (ref 5–15)
BUN: 13 mg/dL (ref 6–20)
CO2: 24 mmol/L (ref 22–32)
Calcium: 9.1 mg/dL (ref 8.9–10.3)
Chloride: 101 mmol/L (ref 98–111)
Creatinine, Ser: 0.92 mg/dL (ref 0.44–1.00)
GFR, Estimated: >60 mL/min (ref >60)
Glucose, Bld: 245 mg/dL — ABNORMAL HIGH (ref 70–99)
Potassium: 3.2 mmol/L — ABNORMAL LOW (ref 3.5–5.1)
Sodium: 137 mmol/L (ref 135–145)
Total Bilirubin: 0.8 mg/dL (ref 0.3–1.2)
Total Protein: 7 g/dL (ref 6.5–8.1)

## 2021-04-18 LAB — CBC
HCT: 37.4 % (ref 36.0–46.0)
Hemoglobin: 11.3 g/dL — ABNORMAL LOW (ref 12.0–15.0)
MCH: 26.5 pg (ref 26.0–34.0)
MCHC: 30.2 g/dL (ref 30.0–36.0)
MCV: 87.6 fL (ref 80.0–100.0)
Platelets: 180 10*3/uL (ref 150–400)
RBC: 4.27 MIL/uL (ref 3.87–5.11)
RDW: 15.4 % (ref 11.5–15.5)
WBC: 5.1 10*3/uL (ref 4.0–10.5)
nRBC: 0 % (ref 0.0–0.2)

## 2021-04-18 LAB — MAGNESIUM: Magnesium: 1.7 mg/dL (ref 1.7–2.4)

## 2021-04-18 LAB — HIV ANTIBODY (ROUTINE TESTING W REFLEX): HIV Screen 4th Generation wRfx: NONREACTIVE

## 2021-04-18 LAB — ECHOCARDIOGRAM COMPLETE
Area-P 1/2: 3.56 cm2
S' Lateral: 2.3 cm

## 2021-04-18 LAB — C-REACTIVE PROTEIN: CRP: 1.8 mg/dL — ABNORMAL HIGH (ref <1.0)

## 2021-04-18 LAB — TROPONIN I (HIGH SENSITIVITY)
Troponin I (High Sensitivity): 1132 ng/L (ref <18)
Troponin I (High Sensitivity): 901 ng/L (ref <18)

## 2021-04-18 LAB — GLUCOSE, CAPILLARY
Glucose-Capillary: 190 mg/dL — ABNORMAL HIGH (ref 70–99)
Glucose-Capillary: 225 mg/dL — ABNORMAL HIGH (ref 70–99)
Glucose-Capillary: 275 mg/dL — ABNORMAL HIGH (ref 70–99)

## 2021-04-18 LAB — HEMOGLOBIN A1C
Hgb A1c MFr Bld: 9.7 % — ABNORMAL HIGH (ref 4.8–5.6)
Mean Plasma Glucose: 231.69 mg/dL

## 2021-04-18 LAB — SEDIMENTATION RATE: Sed Rate: 38 mm/hr — ABNORMAL HIGH (ref 0–22)

## 2021-04-18 MED ORDER — MAGNESIUM SULFATE 2 GM/50ML IV SOLN
2.0000 g | Freq: Once | INTRAVENOUS | Status: AC
  Administered 2021-04-18: 2 g via INTRAVENOUS
  Filled 2021-04-18: qty 50

## 2021-04-18 MED ORDER — POTASSIUM CHLORIDE CRYS ER 20 MEQ PO TBCR
40.0000 meq | EXTENDED_RELEASE_TABLET | Freq: Once | ORAL | Status: AC
  Administered 2021-04-18: 40 meq via ORAL
  Filled 2021-04-18: qty 2

## 2021-04-18 MED ORDER — LIDOCAINE 5 % EX PTCH
1.0000 | MEDICATED_PATCH | Freq: Once | CUTANEOUS | Status: DC | PRN
  Administered 2021-04-23: 1 via TRANSDERMAL
  Filled 2021-04-18: qty 1

## 2021-04-18 MED ORDER — HYDRALAZINE HCL 25 MG PO TABS
25.0000 mg | ORAL_TABLET | Freq: Three times a day (TID) | ORAL | Status: DC
  Administered 2021-04-18 – 2021-04-23 (×16): 25 mg via ORAL
  Filled 2021-04-18 (×16): qty 1

## 2021-04-18 MED ORDER — IBUPROFEN 600 MG PO TABS
600.0000 mg | ORAL_TABLET | Freq: Four times a day (QID) | ORAL | Status: DC | PRN
  Administered 2021-04-18 – 2021-04-23 (×3): 600 mg via ORAL
  Filled 2021-04-18 (×3): qty 1

## 2021-04-18 MED ORDER — LOSARTAN POTASSIUM 25 MG PO TABS
25.0000 mg | ORAL_TABLET | Freq: Every day | ORAL | Status: DC
  Administered 2021-04-18 – 2021-04-23 (×6): 25 mg via ORAL
  Filled 2021-04-18 (×6): qty 1

## 2021-04-18 NOTE — ED Notes (Signed)
Pt still reporting 7/10 pain - pt offered lidoderm patch - pt refused at this time

## 2021-04-18 NOTE — Progress Notes (Signed)
  Echocardiogram 2D Echocardiogram has been performed.  Randa Lynn Giannamarie Paulus 04/18/2021, 12:13 PM

## 2021-04-18 NOTE — Progress Notes (Signed)
Pt refusing CPAP at this time.

## 2021-04-18 NOTE — Progress Notes (Addendum)
HD#0 SUBJECTIVE:  Patient Summary: [name] is a 55 y.o. with a pertinent PMH of HFpEF (EF 65-70% from 12/09/19), HTN, DM2, morbid obesity, OSA on CPAP who presents to the ED with midsternal 7/10 CP and admitted for acute decompensated heart failure.    Overnight Events: no acute events overnight    Interm History: Patient is somnolent throughout interview. patient reports she is "feeling good". She denies any shortness of breath or chest pain at this time. Reports she is urinating a lot.  OBJECTIVE:  Vital Signs: Vitals:   04/18/21 1245 04/18/21 1300 04/18/21 1315 04/18/21 1345  BP: 129/80 104/81 (!) 102/91 111/79  Pulse: 76 85 100 92  Resp: '14 15 20 17  '$ Temp:      TempSrc:      SpO2:  91% 96% 96%   Supplemental O2: Room Air SpO2: 96 % O2 Flow Rate (L/min): 2 L/min  There were no vitals filed for this visit.   Intake/Output Summary (Last 24 hours) at 04/18/2021 1414 Last data filed at 04/18/2021 0210 Gross per 24 hour  Intake --  Output 1000 ml  Net -1000 ml   Net IO Since Admission: -1,000 mL [04/18/21 1414]  Physical Exam: Physical Exam Constitutional:      Appearance: She is obese.     Comments: somnolent  Cardiovascular:     Rate and Rhythm: Normal rate.  Pulmonary:     Effort: Pulmonary effort is normal.     Breath sounds: Normal breath sounds.  Abdominal:     General: Bowel sounds are normal.     Palpations: Abdomen is soft.   Physical Exam Constitutional:      Appearance: She is obese.     Comments: somnolent  Cardiovascular:     Rate and Rhythm: Normal rate.  Pulmonary:     Effort: Pulmonary effort is normal.     Breath sounds: Normal breath sounds.  Abdominal:     General: Bowel sounds are normal.     Palpations: Abdomen is soft.     Patient Lines/Drains/Airways Status     Active Line/Drains/Airways     Name Placement date Placement time Site Days   Peripheral IV 04/17/21 18 G Left Antecubital 04/17/21  1432  Antecubital  1             Pertinent Labs: CBC Latest Ref Rng & Units 04/18/2021 04/17/2021 04/02/2021  WBC 4.0 - 10.5 K/uL 5.1 5.1 5.5  Hemoglobin 12.0 - 15.0 g/dL 11.3(L) 11.9(L) 11.3(L)  Hematocrit 36.0 - 46.0 % 37.4 39.5 37.1  Platelets 150 - 400 K/uL 180 204 181    CMP Latest Ref Rng & Units 04/18/2021 04/17/2021 04/02/2021  Glucose 70 - 99 mg/dL 245(H) 296(H) 188(H)  BUN 6 - 20 mg/dL '13 12 17  '$ Creatinine 0.44 - 1.00 mg/dL 0.92 0.92 0.77  Sodium 135 - 145 mmol/L 137 138 139  Potassium 3.5 - 5.1 mmol/L 3.2(L) 3.2(L) 3.6  Chloride 98 - 111 mmol/L 101 100 102  CO2 22 - 32 mmol/L '24 30 26  '$ Calcium 8.9 - 10.3 mg/dL 9.1 9.1 9.3  Total Protein 6.5 - 8.1 g/dL 7.0 - 7.7  Total Bilirubin 0.3 - 1.2 mg/dL 0.8 - 0.6  Alkaline Phos 38 - 126 U/L 116 - 114  AST 15 - 41 U/L 21 - 22  ALT 0 - 44 U/L 11 - 12    Recent Labs    04/17/21 2024 04/17/21 2331 04/18/21 1053  GLUCAP 148* 214* 203*  Pertinent Imaging: DG Chest 2 View  Result Date: 04/17/2021 CLINICAL DATA:  Chest pain. EXAM: CHEST - 2 VIEW COMPARISON:  Chest radiograph 04/02/2021 and CTA 04/03/2021 FINDINGS: Enlargement of the cardiac silhouette is unchanged. There is persistent pulmonary vascular congestion with bilateral interstitial and hazy airspace opacities, stable to slightly increased compared to the prior radiographs. No pleural effusion or pneumothorax is identified. No acute osseous abnormality is seen. IMPRESSION: Cardiomegaly and mild pulmonary edema. Electronically Signed   By: Logan Bores M.D.   On: 04/17/2021 15:42   ECHOCARDIOGRAM COMPLETE  Result Date: 04/18/2021    ECHOCARDIOGRAM REPORT   Patient Name:   [name] Date of Exam: 04/18/2021 Medical Rec #:  [id number]     Height:       67.0 in Accession #:    [id number]    Weight:       333.0 lb Date of Birth:  Apr 21, 1966     BSA:          2.512 m Patient Age:    35 years      BP:           147/116 mmHg Patient Gender: F             HR:           79 bpm. Exam Location:  Inpatient  Procedure: 2D Echo, Cardiac Doppler and Color Doppler Indications:    [id number] Chronic systolic (congestive) heart failure  History:        Patient has prior history of Echocardiogram examinations, most                 recent 12/09/2019. Pulmonary HTN, Signs/Symptoms:Chest Pain; Risk                 Factors:Hypertension, Diabetes, Dyslipidemia and Sleep Apnea.                 Cor Pulmonale.  Sonographer:    Jonelle Sidle Dance Referring Phys: [id number] ERIK C HOFFMAN  Sonographer Comments: Patient is morbidly obese. Image acquisition challenging due to patient body habitus. IMPRESSIONS  1. Left ventricular ejection fraction, by estimation, is 60 to 65%. The left ventricle has normal function. The left ventricle has no regional wall motion abnormalities. There is severe left ventricular hypertrophy. Left ventricular diastolic function could not be evaluated.  2. Right ventricular systolic function is normal. The right ventricular size is normal. There is moderately elevated pulmonary artery systolic pressure.  3. Left atrial size was severely dilated.  4. Right atrial size was mildly dilated.  5. The mitral valve is normal in structure. Mild mitral valve regurgitation. No evidence of mitral stenosis.  6. Tricuspid valve regurgitation is moderate to severe.  7. The aortic valve is tricuspid. Aortic valve regurgitation is trivial. No aortic stenosis is present.  8. The inferior vena cava is dilated in size with >50% respiratory variability, suggesting right atrial pressure of 8 mmHg. FINDINGS  Left Ventricle: Left ventricular ejection fraction, by estimation, is 60 to 65%. The left ventricle has normal function. The left ventricle has no regional wall motion abnormalities. The left ventricular internal cavity size was normal in size. There is  severe left ventricular hypertrophy. Left ventricular diastolic function could not be evaluated due to atrial fibrillation. Left ventricular diastolic function could not be evaluated. Right  Ventricle: The right ventricular size is normal. Right ventricular systolic function is normal. There is moderately elevated pulmonary artery systolic pressure. The tricuspid regurgitant velocity is 2.86 m/s, and with  an assumed right atrial pressure of 15 mmHg, the estimated right ventricular systolic pressure is [id number] mmHg. Left Atrium: Left atrial size was severely dilated. Right Atrium: Right atrial size was mildly dilated. Pericardium: Trivial pericardial effusion is present. Mitral Valve: The mitral valve is normal in structure. Mild mitral annular calcification. Mild mitral valve regurgitation. No evidence of mitral valve stenosis. Tricuspid Valve: The tricuspid valve is normal in structure. Tricuspid valve regurgitation is moderate to severe. No evidence of tricuspid stenosis. Aortic Valve: The aortic valve is tricuspid. Aortic valve regurgitation is trivial. No aortic stenosis is present. Pulmonic Valve: The pulmonic valve was normal in structure. Pulmonic valve regurgitation is not visualized. No evidence of pulmonic stenosis. Aorta: The aortic root is normal in size and structure. Venous: The inferior vena cava is dilated in size with greater than 50% respiratory variability, suggesting right atrial pressure of 8 mmHg. IAS/Shunts: No atrial level shunt detected by color flow Doppler.  LEFT VENTRICLE PLAX 2D LVIDd:         3.10 cm LVIDs:         2.30 cm LV PW:         1.80 cm LV IVS:        1.60 cm LVOT diam:     2.00 cm LV SV:         54 LV SV Index:   21 LVOT Area:     3.14 cm  RIGHT VENTRICLE          IVC RV Basal diam:  3.60 cm  IVC diam: 3.30 cm RV Mid diam:    3.20 cm TAPSE (M-mode): 1.6 cm LEFT ATRIUM              Index       RIGHT ATRIUM           Index LA diam:        5.80 cm  2.31 cm/m  RA Area:     28.60 cm LA Vol (A2C):   106.0 ml 42.21 ml/m RA Volume:   88.50 ml  35.24 ml/m LA Vol (A4C):   113.0 ml 44.99 ml/m LA Biplane Vol: 111.0 ml 44.20 ml/m  AORTIC VALVE LVOT Vmax:   72.25 cm/s LVOT  Vmean:  53.000 cm/s LVOT VTI:    0.171 m  AORTA Ao Root diam: 3.30 cm Ao Asc diam:  3.30 cm MITRAL VALVE                TRICUSPID VALVE MV Area (PHT): 3.56 cm     TR Peak grad:   32.7 mmHg MV Decel Time: 213 msec     TR Vmax:        286.00 cm/s MV E velocity: 131.00 cm/s                             SHUNTS                             Systemic VTI:  0.17 m                             Systemic Diam: 2.00 cm [name] MD Electronically signed by [name] MD Signature Date/Time: 04/18/2021/1:38:36 PM    Final    VAS [place] LOWER EXTREMITY VENOUS (DVT) (ONLY [facility] & [facility])  Result Date: 04/17/2021  Lower Venous DVT  Study Patient Name:  [name]  Date of Exam:   04/17/2021 Medical Rec #: [id number]      Accession #:    [id number] Date of Birth: December 17, 1965      Patient Gender: F Patient Age:   [id number] Exam Location:  [facility] Procedure:      VAS [place] LOWER EXTREMITY VENOUS (DVT) Referring Phys: [id number] Marcello Fennel --------------------------------------------------------------------------------  Indications: Swelling, and Edema.  Limitations: Body habitus and poor ultrasound/tissue interface. Comparison Study: no prior Performing Technologist: Archie Patten RVS  Examination Guidelines: A complete evaluation includes B-mode imaging, spectral Doppler, color Doppler, and power Doppler as needed of all accessible portions of each vessel. Bilateral testing is considered an integral part of a complete examination. Limited examinations for reoccurring indications may be performed as noted. The reflux portion of the exam is performed with the patient in reverse Trendelenburg.  +---------+---------------+---------+-----------+----------+-------------------+ RIGHT    CompressibilityPhasicitySpontaneityPropertiesThrombus Aging      +---------+---------------+---------+-----------+----------+-------------------+ CFV      Full           Yes      Yes                                       +---------+---------------+---------+-----------+----------+-------------------+ SFJ      Full                                                             +---------+---------------+---------+-----------+----------+-------------------+ FV Prox  Full                                                             +---------+---------------+---------+-----------+----------+-------------------+ FV Mid                  Yes      Yes                                      +---------+---------------+---------+-----------+----------+-------------------+ FV Distal               Yes      Yes                                      +---------+---------------+---------+-----------+----------+-------------------+ PFV      Full                                                             +---------+---------------+---------+-----------+----------+-------------------+ POP      Full           Yes      Yes                                      +---------+---------------+---------+-----------+----------+-------------------+  PTV      Full                                                             +---------+---------------+---------+-----------+----------+-------------------+ PERO                                                  Not well visualized +---------+---------------+---------+-----------+----------+-------------------+   +---------+---------------+---------+-----------+----------+-------------------+ LEFT     CompressibilityPhasicitySpontaneityPropertiesThrombus Aging      +---------+---------------+---------+-----------+----------+-------------------+ CFV      Full           Yes      Yes                                      +---------+---------------+---------+-----------+----------+-------------------+ SFJ      Full                                                             +---------+---------------+---------+-----------+----------+-------------------+ FV  Prox  Full                                                             +---------+---------------+---------+-----------+----------+-------------------+ FV Mid                  Yes      Yes                                      +---------+---------------+---------+-----------+----------+-------------------+ FV Distal               Yes      Yes                                      +---------+---------------+---------+-----------+----------+-------------------+ POP      Full           Yes      Yes                                      +---------+---------------+---------+-----------+----------+-------------------+ PTV      Full                                                             +---------+---------------+---------+-----------+----------+-------------------+ PERO  Not well visualized +---------+---------------+---------+-----------+----------+-------------------+    Summary: RIGHT: - There is no evidence of deep vein thrombosis in the lower extremity. However, portions of this examination were limited- see technologist comments above.  - No cystic structure found in the popliteal fossa.  LEFT: - There is no evidence of deep vein thrombosis in the lower extremity. However, portions of this examination were limited- see technologist comments above.  - No cystic structure found in the popliteal fossa.  *See table(s) above for measurements and observations.    Preliminary     ASSESSMENT/PLAN:  Assessment: Principal Problem:   Acute on chronic heart failure (HCC) Active Problems:   Class 3 severe obesity due to excess calories with serious comorbidity and body mass index (BMI) of 60.0 to 69.9 in adult [facility])   Acute on chronic congestive heart failure ([place])   [name] is a 55 y.o. with a pertinent PMH of HFpEF (EF 65-70% from 12/09/19), HTN, DM2, morbid obesity, OSA on CPAP who presents to the ED with midsternal 7/10  CP and admitted for acute decompensated heart failure. on hospital day 0  Plan: Chest pain Acute on chronic heart failure exacerbation Chest pain likely secondary from acute on chronic HF exacerbation. Troponins only mildly elevated (34 -> 193) and I expect this is a type 2 NTEMI. Pt last heart cath showed no signs of significant CAD. Pt has longstanding hx of chronic diastolic congestive heart failure, HFpEF with EF 65-70% (12/09/19), obesity and OSA on CPAP. Pt noncompliant with fluid restriction and missed 2 days of home lasix. BNP 246; last exacerbation on 7/6 BNP 261. BUN 13, Cr 0.92 (baseline 0.77-1.03) -Echo kshowing severe left ventricular hypertrophy -Will continue lasix -Daily weights and I&Os - Cardiology following, appreciate their recs   Diabetes mellitus type 2 Hg A1c 9.7, previous 9.8 (10/12/20) Home regimin includes dulaglutide, glipizide and metformin. -Hold home meds -4 units of mealtimes along with resistant sliding scale and 15 units Lantus QHS   Atrial fibrillation Continue Xarelto and Coreg   HTN -continue home meds amlodipine 10, carvedilol 25, losartan 25   Electrolyte abnormality   -K 3.2, kdur 52mq given in ED. Continue to monitor and replete if necessary. -monitor Mg, and replete with Mg sulfate IV if necessary.  Best Practice: Diet: Cardiac diet and Diabetic diet IVF: Fluids: none, Rate: None VTE:  Code: Full AB: none  Signature: GDelene Ruffini MD  Internal Medicine Resident, PGY-1 [facility]  Pager: #[phone]:14 PM, 04/18/2021   Please contact the on call pager after 5 pm and on weekends at [phone]

## 2021-04-18 NOTE — Progress Notes (Addendum)
Inpatient Diabetes Program Recommendations  AACE/ADA: New Consensus Statement on Inpatient Glycemic Control (2015)  Target Ranges:  Prepandial:   less than 140 mg/dL      Peak postprandial:   less than 180 mg/dL (1-2 hours)      Critically ill patients:  140 - 180 mg/dL   Lab Results  Component Value Date   GLUCAP 203 (H) 04/18/2021   HGBA1C 9.7 (H) 04/17/2021    Review of Glycemic Control  Diabetes history: DM 2 Outpatient Diabetes medications: Trulicity 1.5 mg wkly, glipizide 10 mg Daily, Metformin 1000 mg bid Current orders for Inpatient glycemic control:  Lantus 15 units Novolog 0-20 units tid + hs Novolog 4 units tid meal coverage  Inpatient Diabetes Program Recommendations:    -  Increase Lantus to 20 units  Pt was seen by the [facility] and was followed by Benard Halsted, pharmacist for DM management. Last changes made were in February. At April PCP visit no changes made with encouragement to lifestyle changes as pt has been non compliant with diet. A1c consistent in the upper 9 percentage range the last 6 months. Pt on Trulicity injection at home, maybe would be open to insulin at home.   Addendum 1:04 pm: Spoke with pt at bedside. Pt not interested in possible insulin injections once a day but is ok with her Trulicity injection once a week. Discussed the need for glucose control at home. Pt is not compliant with diet but says she is open to changing some things for better glucose control.   Thanks,  Tama Headings RN, MSN, BC-ADM Inpatient Diabetes Coordinator Team Pager [phone] (8a-5p)

## 2021-04-18 NOTE — ED Notes (Signed)
Dr. Court Joy notified of pt BP per order set

## 2021-04-18 NOTE — Progress Notes (Signed)
Progress Note  Patient Name: [name] Date of Encounter: 04/18/2021  [facility] Cardiologist: [name], MD   Subjective   Feels much better this morning. No further episodes of chest pain. Significant UOP overnight. Breathing is much better per her report.   Inpatient Medications    Scheduled Meds:  amLODipine  10 mg Oral Daily   carvedilol  25 mg Oral BID WC   furosemide  80 mg Intravenous BID   insulin aspart  0-20 Units Subcutaneous TID WC   insulin aspart  0-5 Units Subcutaneous QHS   insulin aspart  4 Units Subcutaneous TID WC   insulin glargine  15 Units Subcutaneous QHS   losartan  25 mg Oral Daily   oxybutynin  5 mg Oral TID   risperidone  4 mg Oral Daily   rivaroxaban  20 mg Oral Q supper   Continuous Infusions:  PRN Meds: bisacodyl, ibuprofen, lidocaine, nitroGLYCERIN, senna-docusate   Vital Signs    Vitals:   04/18/21 0715 04/18/21 0730 04/18/21 0745 04/18/21 0800  BP: (!) 141/93 (!) 141/90 (!) 148/101 (!) 145/98  Pulse: 76 73 76 73  Resp: '14 13 13 14  '$ Temp:      TempSrc:      SpO2: 96% 95% 95% 94%    Intake/Output Summary (Last 24 hours) at 04/18/2021 1008 Last data filed at 04/18/2021 0210 Gross per 24 hour  Intake --  Output 1000 ml  Net -1000 ml   Last 3 Weights 03/29/2021 12/26/2020 10/02/2020  Weight (lbs) 333 lb 341 lb 6.4 oz 340 lb  Weight (kg) 151.048 kg 154.858 kg 154.223 kg      Telemetry    Afib rates 80-90s - Personally Reviewed  ECG    Afib rate 77bpm - Personally Reviewed  Physical Exam   GEN: Pleasant obese female, laying in bed comfortable resting. Neck: No JVD Cardiac: Irreg Irreg, no murmurs, rubs, or gallops.  Respiratory: Clear to auscultation bilaterally. GI: Soft, nontender, non-distended  MS: No edema; No deformity. Neuro:  Nonfocal  Psych: Normal affect   Labs    High Sensitivity Troponin:   Recent Labs  Lab 04/02/21 2348 04/17/21 1445 04/17/21 1645 04/18/21 0026 04/18/21 0226   TROPONINIHS 3 34* 193* 1,132* 901*      Chemistry Recent Labs  Lab 04/17/21 1445 04/18/21 0226  NA 138 137  K 3.2* 3.2*  CL 100 101  CO2 30 24  GLUCOSE 296* 245*  BUN 12 13  CREATININE 0.92 0.92  CALCIUM 9.1 9.1  PROT  --  7.0  ALBUMIN  --  3.3*  AST  --  21  ALT  --  11  ALKPHOS  --  116  BILITOT  --  0.8  GFRNONAA >60 >60  ANIONGAP 8 12     Hematology Recent Labs  Lab 04/17/21 1445 04/18/21 0326  WBC 5.1 5.1  RBC 4.51 4.27  HGB 11.9* 11.3*  HCT 39.5 37.4  MCV 87.6 87.6  MCH 26.4 26.5  MCHC 30.1 30.2  RDW 15.5 15.4  PLT 204 180    BNP Recent Labs  Lab 04/17/21 1445  BNP 246.1*     DDimer No results for input(s): DDIMER in the last 168 hours.   Radiology    DG Chest 2 View  Result Date: 04/17/2021 CLINICAL DATA:  Chest pain. EXAM: CHEST - 2 VIEW COMPARISON:  Chest radiograph 04/02/2021 and CTA 04/03/2021 FINDINGS: Enlargement of the cardiac silhouette is unchanged. There is persistent pulmonary vascular congestion with  bilateral interstitial and hazy airspace opacities, stable to slightly increased compared to the prior radiographs. No pleural effusion or pneumothorax is identified. No acute osseous abnormality is seen. IMPRESSION: Cardiomegaly and mild pulmonary edema. Electronically Signed   By: Logan Bores M.D.   On: 04/17/2021 15:42   VAS [place] LOWER EXTREMITY VENOUS (DVT) (ONLY [facility] & [facility])  Result Date: 04/17/2021  Lower Venous DVT Study Patient Name:  GIULIETTE CHAPPELLE  Date of Exam:   04/17/2021 Medical Rec #: [id number]      Accession #:    [id number] Date of Birth: 1966-05-19      Patient Gender: F Patient Age:   [id number] Exam Location:  [facility] Procedure:      VAS [place] LOWER EXTREMITY VENOUS (DVT) Referring Phys: [id number] Marcello Fennel --------------------------------------------------------------------------------  Indications: Swelling, and Edema.  Limitations: Body habitus and poor ultrasound/tissue interface. Comparison Study: no prior  Performing Technologist: Archie Patten RVS  Examination Guidelines: A complete evaluation includes B-mode imaging, spectral Doppler, color Doppler, and power Doppler as needed of all accessible portions of each vessel. Bilateral testing is considered an integral part of a complete examination. Limited examinations for reoccurring indications may be performed as noted. The reflux portion of the exam is performed with the patient in reverse Trendelenburg.  +---------+---------------+---------+-----------+----------+-------------------+ RIGHT    CompressibilityPhasicitySpontaneityPropertiesThrombus Aging      +---------+---------------+---------+-----------+----------+-------------------+ CFV      Full           Yes      Yes                                      +---------+---------------+---------+-----------+----------+-------------------+ SFJ      Full                                                             +---------+---------------+---------+-----------+----------+-------------------+ FV Prox  Full                                                             +---------+---------------+---------+-----------+----------+-------------------+ FV Mid                  Yes      Yes                                      +---------+---------------+---------+-----------+----------+-------------------+ FV Distal               Yes      Yes                                      +---------+---------------+---------+-----------+----------+-------------------+ PFV      Full                                                             +---------+---------------+---------+-----------+----------+-------------------+  POP      Full           Yes      Yes                                      +---------+---------------+---------+-----------+----------+-------------------+ PTV      Full                                                              +---------+---------------+---------+-----------+----------+-------------------+ PERO                                                  Not well visualized +---------+---------------+---------+-----------+----------+-------------------+   +---------+---------------+---------+-----------+----------+-------------------+ LEFT     CompressibilityPhasicitySpontaneityPropertiesThrombus Aging      +---------+---------------+---------+-----------+----------+-------------------+ CFV      Full           Yes      Yes                                      +---------+---------------+---------+-----------+----------+-------------------+ SFJ      Full                                                             +---------+---------------+---------+-----------+----------+-------------------+ FV Prox  Full                                                             +---------+---------------+---------+-----------+----------+-------------------+ FV Mid                  Yes      Yes                                      +---------+---------------+---------+-----------+----------+-------------------+ FV Distal               Yes      Yes                                      +---------+---------------+---------+-----------+----------+-------------------+ POP      Full           Yes      Yes                                      +---------+---------------+---------+-----------+----------+-------------------+ PTV      Full                                                             +---------+---------------+---------+-----------+----------+-------------------+  PERO                                                  Not well visualized +---------+---------------+---------+-----------+----------+-------------------+    Summary: RIGHT: - There is no evidence of deep vein thrombosis in the lower extremity. However, portions of this examination were limited- see technologist  comments above.  - No cystic structure found in the popliteal fossa.  LEFT: - There is no evidence of deep vein thrombosis in the lower extremity. However, portions of this examination were limited- see technologist comments above.  - No cystic structure found in the popliteal fossa.  *See table(s) above for measurements and observations.    Preliminary     Cardiac Studies   Cath: 07/2018  Widely patent coronary arteries. 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  Courses between PA and AO.  Mild dynamic compression is noted. Hypercontractile left ventricle.  Marked elevation in LVEDP, 26 mmHg.  EF greater than 65%. Ventricular fibrillation treated with defibrillation x1. Conversion to normal sinus rhythm after defibrillation.   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 Resume anticoagulation, likely Xarelto since coronaries are clean. Further management per treating team. Discontinue nitroglycerin IV.    Echo: pending  Patient Profile     55 y.o. 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  Assessment & Plan    Chest Pain with elevated troponin: hsTn peaked at 1132, then down trending. Symptoms are somewhat atypical in nature. Sed rate 38, CRP 1.8. Symptoms have improved significantly with IV diuresis. Unclear etiology of elevated troponin, though in the setting of acute CHF and hypertension. Prior cath 2019 does make note of possible hypertrophic cardiomyopathy. Consideration given to treatment with colchicine but with improvement of symptoms will hold for now.  -- echo pending  Acute on Chronic diastolic HF: BNP  elevated at 246, CXR with pulmonary edema. Was given IV lasix '80mg'$  x1 with 1L UOP thus far. Lungs improved, but still volume on board. Takes torsemide '80mg'$  BID PTA -- continue IV lasix today -- echo pending -- on BB, ARB  HTN: BP was significantly elevated on admission. Has been resumed on home coreg '25mg'$  BID, losartan '25mg'$  daily along with amlodipine '10mg'$  daily. BP remains elevated.  -- will add hydralazine '25mg'$  TID  HLD:  on atorvastatin '40mg'$  daily PTA  Persistent Afib: rates controlled. Prior echo from 11/2019 with mild bi atrial enlargement, no significant valvular disease. Hx of multiple attempts at [place] in the past.  -- plan for rate control, remains on Xarelto as well as coreg  Hypokalemia: K+ 3.2, supplement  DM: Hgb A1c 9.7 -- SSI -- appears she was tried on SLGT2 in the past and did not tolerate  For questions or updates, please contact [facility] Please consult [email or web address] for contact info under        Signed, Reino Bellis, NP  04/18/2021, 10:08 AM

## 2021-04-18 NOTE — ED Notes (Signed)
Pt reports 7/10 pain - pt refuses nitro - MD Court Joy notified - med orders placed

## 2021-04-18 NOTE — ED Notes (Signed)
Pt feels that she is unable to use purewick - this RN placed pt in non slip socks and assisted pt to restroom. Pt had 1043m+ of output

## 2021-04-18 NOTE — ED Notes (Signed)
This RN had Dr. Court Joy paged to inform him of pt troponin coming back at 1132. Dr. Court Joy to order repeat EKG.

## 2021-04-19 DIAGNOSIS — I5033 Acute on chronic diastolic (congestive) heart failure: Secondary | ICD-10-CM

## 2021-04-19 LAB — GLUCOSE, CAPILLARY
Glucose-Capillary: 169 mg/dL — ABNORMAL HIGH (ref 70–99)
Glucose-Capillary: 242 mg/dL — ABNORMAL HIGH (ref 70–99)
Glucose-Capillary: 309 mg/dL — ABNORMAL HIGH (ref 70–99)
Glucose-Capillary: 91 mg/dL (ref 70–99)

## 2021-04-19 LAB — CBC
HCT: 34.8 % — ABNORMAL LOW (ref 36.0–46.0)
Hemoglobin: 10.7 g/dL — ABNORMAL LOW (ref 12.0–15.0)
MCH: 26.8 pg (ref 26.0–34.0)
MCHC: 30.7 g/dL (ref 30.0–36.0)
MCV: 87 fL (ref 80.0–100.0)
Platelets: 182 10*3/uL (ref 150–400)
RBC: 4 MIL/uL (ref 3.87–5.11)
RDW: 15.3 % (ref 11.5–15.5)
WBC: 4.7 10*3/uL (ref 4.0–10.5)
nRBC: 0 % (ref 0.0–0.2)

## 2021-04-19 LAB — BASIC METABOLIC PANEL
Anion gap: 7 (ref 5–15)
BUN: 19 mg/dL (ref 6–20)
CO2: 26 mmol/L (ref 22–32)
Calcium: 9.2 mg/dL (ref 8.9–10.3)
Chloride: 104 mmol/L (ref 98–111)
Creatinine, Ser: 0.93 mg/dL (ref 0.44–1.00)
GFR, Estimated: >60 mL/min (ref >60)
Glucose, Bld: 181 mg/dL — ABNORMAL HIGH (ref 70–99)
Potassium: 4.2 mmol/L (ref 3.5–5.1)
Sodium: 137 mmol/L (ref 135–145)

## 2021-04-19 LAB — MAGNESIUM: Magnesium: 2.1 mg/dL (ref 1.7–2.4)

## 2021-04-19 MED ORDER — INSULIN ASPART 100 UNIT/ML IJ SOLN
6.0000 [IU] | Freq: Three times a day (TID) | INTRAMUSCULAR | Status: DC
  Administered 2021-04-19 – 2021-04-20 (×3): 6 [IU] via SUBCUTANEOUS

## 2021-04-19 NOTE — Progress Notes (Signed)
Unable to do adm.history bec.I got also a new admission & patient slept most of the time last night

## 2021-04-19 NOTE — Progress Notes (Signed)
Progress Note  Patient Name: [name] Date of Encounter: 04/19/2021  [facility] Cardiologist: [name], MD   Subjective   SOB improving.   Inpatient Medications    Scheduled Meds:  amLODipine  10 mg Oral Daily   carvedilol  25 mg Oral BID WC   furosemide  80 mg Intravenous BID   hydrALAZINE  25 mg Oral Q8H   insulin aspart  0-20 Units Subcutaneous TID WC   insulin aspart  0-5 Units Subcutaneous QHS   insulin aspart  4 Units Subcutaneous TID WC   insulin glargine  15 Units Subcutaneous QHS   losartan  25 mg Oral Daily   oxybutynin  5 mg Oral TID   risperidone  4 mg Oral Daily   rivaroxaban  20 mg Oral Q supper   Continuous Infusions:  PRN Meds: bisacodyl, ibuprofen, lidocaine, nitroGLYCERIN, senna-docusate   Vital Signs    Vitals:   04/18/21 2249 04/19/21 0140 04/19/21 0433 04/19/21 0637  BP: (!) 139/91  120/77 140/88  Pulse:   83   Resp:   16   Temp:   97.7 F (36.5 C)   TempSrc:   Oral   SpO2:   96%   Weight:  (!) 156.9 kg      Intake/Output Summary (Last 24 hours) at 04/19/2021 0801 Last data filed at 04/19/2021 0146 Gross per 24 hour  Intake 180 ml  Output 800 ml  Net -620 ml   Last 3 Weights 04/19/2021 03/29/2021 12/26/2020  Weight (lbs) 346 lb 333 lb 341 lb 6.4 oz  Weight (kg) 156.945 kg 151.048 kg 154.858 kg      Telemetry    Rate controlled afib - Personally Reviewed  ECG    N/a - Personally Reviewed  Physical Exam   GEN: No acute distress.   Neck: No JVD Cardiac: irreg no murmurs, rubs, or gallops.  Respiratory: Clear to auscultation bilaterally. GI: Soft, nontender, non-distended  MS: 1+ bilateral edema No deformity. Neuro:  Nonfocal  Psych: Normal affect   Labs    High Sensitivity Troponin:   Recent Labs  Lab 04/02/21 2348 04/17/21 1445 04/17/21 1645 04/18/21 0026 04/18/21 0226  TROPONINIHS 3 34* 193* 1,132* 901*      Chemistry Recent Labs  Lab 04/17/21 1445 04/18/21 0226 04/19/21 0416  NA 138 137  137  K 3.2* 3.2* 4.2  CL 100 101 104  CO2 '30 24 26  '$ GLUCOSE 296* 245* 181*  BUN '12 13 19  '$ CREATININE 0.92 0.92 0.93  CALCIUM 9.1 9.1 9.2  PROT  --  7.0  --   ALBUMIN  --  3.3*  --   AST  --  21  --   ALT  --  11  --   ALKPHOS  --  116  --   BILITOT  --  0.8  --   GFRNONAA >60 >60 >60  ANIONGAP '8 12 7     '$ Hematology Recent Labs  Lab 04/17/21 1445 04/18/21 0326 04/19/21 0416  WBC 5.1 5.1 4.7  RBC 4.51 4.27 4.00  HGB 11.9* 11.3* 10.7*  HCT 39.5 37.4 34.8*  MCV 87.6 87.6 87.0  MCH 26.4 26.5 26.8  MCHC 30.1 30.2 30.7  RDW 15.5 15.4 15.3  PLT 204 180 182    BNP Recent Labs  Lab 04/17/21 1445  BNP 246.1*     DDimer No results for input(s): DDIMER in the last 168 hours.   Radiology    DG Chest 2 View  Result Date:  04/17/2021 CLINICAL DATA:  Chest pain. EXAM: CHEST - 2 VIEW COMPARISON:  Chest radiograph 04/02/2021 and CTA 04/03/2021 FINDINGS: Enlargement of the cardiac silhouette is unchanged. There is persistent pulmonary vascular congestion with bilateral interstitial and hazy airspace opacities, stable to slightly increased compared to the prior radiographs. No pleural effusion or pneumothorax is identified. No acute osseous abnormality is seen. IMPRESSION: Cardiomegaly and mild pulmonary edema. Electronically Signed   By: Logan Bores M.D.   On: 04/17/2021 15:42   ECHOCARDIOGRAM COMPLETE  Result Date: 04/18/2021    ECHOCARDIOGRAM REPORT   Patient Name:   [name] Date of Exam: 04/18/2021 Medical Rec #:  [id number]     Height:       67.0 in Accession #:    [id number]    Weight:       333.0 lb Date of Birth:  March 05, 1966     BSA:          2.512 m Patient Age:    55 years      BP:           147/116 mmHg Patient Gender: F             HR:           79 bpm. Exam Location:  Inpatient Procedure: 2D Echo, Cardiac Doppler and Color Doppler Indications:    [id number] Chronic systolic (congestive) heart failure  History:        Patient has prior history of Echocardiogram  examinations, most                 recent 12/09/2019. Pulmonary HTN, Signs/Symptoms:Chest Pain; Risk                 Factors:Hypertension, Diabetes, Dyslipidemia and Sleep Apnea.                 Cor Pulmonale.  Sonographer:    Jonelle Sidle Dance Referring Phys: [id number] ERIK C HOFFMAN  Sonographer Comments: Patient is morbidly obese. Image acquisition challenging due to patient body habitus. IMPRESSIONS  1. Left ventricular ejection fraction, by estimation, is 60 to 65%. The left ventricle has normal function. The left ventricle has no regional wall motion abnormalities. There is severe left ventricular hypertrophy. Left ventricular diastolic function could not be evaluated.  2. Right ventricular systolic function is normal. The right ventricular size is normal. There is moderately elevated pulmonary artery systolic pressure.  3. Left atrial size was severely dilated.  4. Right atrial size was mildly dilated.  5. The mitral valve is normal in structure. Mild mitral valve regurgitation. No evidence of mitral stenosis.  6. Tricuspid valve regurgitation is moderate to severe.  7. The aortic valve is tricuspid. Aortic valve regurgitation is trivial. No aortic stenosis is present.  8. The inferior vena cava is dilated in size with >50% respiratory variability, suggesting right atrial pressure of 8 mmHg. FINDINGS  Left Ventricle: Left ventricular ejection fraction, by estimation, is 60 to 65%. The left ventricle has normal function. The left ventricle has no regional wall motion abnormalities. The left ventricular internal cavity size was normal in size. There is  severe left ventricular hypertrophy. Left ventricular diastolic function could not be evaluated due to atrial fibrillation. Left ventricular diastolic function could not be evaluated. Right Ventricle: The right ventricular size is normal. Right ventricular systolic function is normal. There is moderately elevated pulmonary artery systolic pressure. The tricuspid  regurgitant velocity is 2.86 m/s, and with an assumed right atrial pressure of 15 mmHg, the  estimated right ventricular systolic pressure is [id number] mmHg. Left Atrium: Left atrial size was severely dilated. Right Atrium: Right atrial size was mildly dilated. Pericardium: Trivial pericardial effusion is present. Mitral Valve: The mitral valve is normal in structure. Mild mitral annular calcification. Mild mitral valve regurgitation. No evidence of mitral valve stenosis. Tricuspid Valve: The tricuspid valve is normal in structure. Tricuspid valve regurgitation is moderate to severe. No evidence of tricuspid stenosis. Aortic Valve: The aortic valve is tricuspid. Aortic valve regurgitation is trivial. No aortic stenosis is present. Pulmonic Valve: The pulmonic valve was normal in structure. Pulmonic valve regurgitation is not visualized. No evidence of pulmonic stenosis. Aorta: The aortic root is normal in size and structure. Venous: The inferior vena cava is dilated in size with greater than 50% respiratory variability, suggesting right atrial pressure of 8 mmHg. IAS/Shunts: No atrial level shunt detected by color flow Doppler.  LEFT VENTRICLE PLAX 2D LVIDd:         3.10 cm LVIDs:         2.30 cm LV PW:         1.80 cm LV IVS:        1.60 cm LVOT diam:     2.00 cm LV SV:         54 LV SV Index:   21 LVOT Area:     3.14 cm  RIGHT VENTRICLE          IVC RV Basal diam:  3.60 cm  IVC diam: 3.30 cm RV Mid diam:    3.20 cm TAPSE (M-mode): 1.6 cm LEFT ATRIUM              Index       RIGHT ATRIUM           Index LA diam:        5.80 cm  2.31 cm/m  RA Area:     28.60 cm LA Vol (A2C):   106.0 ml 42.21 ml/m RA Volume:   88.50 ml  35.24 ml/m LA Vol (A4C):   113.0 ml 44.99 ml/m LA Biplane Vol: 111.0 ml 44.20 ml/m  AORTIC VALVE LVOT Vmax:   72.25 cm/s LVOT Vmean:  53.000 cm/s LVOT VTI:    0.171 m  AORTA Ao Root diam: 3.30 cm Ao Asc diam:  3.30 cm MITRAL VALVE                TRICUSPID VALVE MV Area (PHT): 3.56 cm     TR Peak  grad:   32.7 mmHg MV Decel Time: 213 msec     TR Vmax:        286.00 cm/s MV E velocity: 131.00 cm/s                             SHUNTS                             Systemic VTI:  0.17 m                             Systemic Diam: 2.00 cm [name] MD Electronically signed by [name] MD Signature Date/Time: 04/18/2021/1:38:36 PM    Final    VAS [place] LOWER EXTREMITY VENOUS (DVT) (ONLY [facility] & [facility])  Result Date: 04/18/2021  Lower Venous DVT Study Patient Name:  [name]  Date of  Exam:   04/17/2021 Medical Rec #: [id number]      Accession #:    [id number] Date of Birth: 1966-02-20      Patient Gender: F Patient Age:   [id number] Exam Location:  [facility] Procedure:      VAS [place] LOWER EXTREMITY VENOUS (DVT) Referring Phys: [id number] Marcello Fennel --------------------------------------------------------------------------------  Indications: Swelling, and Edema.  Limitations: Body habitus and poor ultrasound/tissue interface. Comparison Study: no prior Performing Technologist: Archie Patten RVS  Examination Guidelines: A complete evaluation includes B-mode imaging, spectral Doppler, color Doppler, and power Doppler as needed of all accessible portions of each vessel. Bilateral testing is considered an integral part of a complete examination. Limited examinations for reoccurring indications may be performed as noted. The reflux portion of the exam is performed with the patient in reverse Trendelenburg.  +---------+---------------+---------+-----------+----------+-------------------+ RIGHT    CompressibilityPhasicitySpontaneityPropertiesThrombus Aging      +---------+---------------+---------+-----------+----------+-------------------+ CFV      Full           Yes      Yes                                      +---------+---------------+---------+-----------+----------+-------------------+ SFJ      Full                                                              +---------+---------------+---------+-----------+----------+-------------------+ FV Prox  Full                                                             +---------+---------------+---------+-----------+----------+-------------------+ FV Mid                  Yes      Yes                                      +---------+---------------+---------+-----------+----------+-------------------+ FV Distal               Yes      Yes                                      +---------+---------------+---------+-----------+----------+-------------------+ PFV      Full                                                             +---------+---------------+---------+-----------+----------+-------------------+ POP      Full           Yes      Yes                                      +---------+---------------+---------+-----------+----------+-------------------+  PTV      Full                                                             +---------+---------------+---------+-----------+----------+-------------------+ PERO                                                  Not well visualized +---------+---------------+---------+-----------+----------+-------------------+   +---------+---------------+---------+-----------+----------+-------------------+ LEFT     CompressibilityPhasicitySpontaneityPropertiesThrombus Aging      +---------+---------------+---------+-----------+----------+-------------------+ CFV      Full           Yes      Yes                                      +---------+---------------+---------+-----------+----------+-------------------+ SFJ      Full                                                             +---------+---------------+---------+-----------+----------+-------------------+ FV Prox  Full                                                             +---------+---------------+---------+-----------+----------+-------------------+ FV  Mid                  Yes      Yes                                      +---------+---------------+---------+-----------+----------+-------------------+ FV Distal               Yes      Yes                                      +---------+---------------+---------+-----------+----------+-------------------+ POP      Full           Yes      Yes                                      +---------+---------------+---------+-----------+----------+-------------------+ PTV      Full                                                             +---------+---------------+---------+-----------+----------+-------------------+ PERO  Not well visualized +---------+---------------+---------+-----------+----------+-------------------+     Summary: RIGHT: - There is no evidence of deep vein thrombosis in the lower extremity. However, portions of this examination were limited- see technologist comments above.  - No cystic structure found in the popliteal fossa.  LEFT: - There is no evidence of deep vein thrombosis in the lower extremity. However, portions of this examination were limited- see technologist comments above.  - No cystic structure found in the popliteal fossa.  *See table(s) above for measurements and observations. Electronically signed by Jamelle Haring on 04/18/2021 at 4:37:39 PM.    Final     Cardiac Studies     Patient Profile     55 y.o. 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  Assessment & Plan    1.Chest pain/elevated troponin - presented with somewhat atypical chest pain - peak trop 1132, trending down. Cath 2 years ago with  normal coronaries -from prior roudning notes thought to be demand ischemia in setting of HTN, HF, LVH. No plans for ischemic testing.    2. Acute on chronic diastolic HF - echo LVEF [id number], no WMAs, severe LVH, indet diastolif fx, normal RV,moderately elevated PASP, mod to severe TR,  - incomplete I/Os data, weights appear inaccurate (will ask for daily standing weights). Body habitus limits exam for voluime statusReceived IV lasix '80mg'$  x 2 yesterday, renal function is stable. - continue IV diuretics today.     3. HTN - presented with significant HTN 160s/100s - bp's essentially at goal, continue to monitor with ongoing diuresis   4. Persistent afib - Hx of multiple attempts at [place] in the past. - continue rate controle  For questions or updates, please contact [place] Please consult [email or web address] for contact info under        Signed, Carlyle Dolly, MD  04/19/2021, 8:01 AM

## 2021-04-19 NOTE — Progress Notes (Signed)
Unable to complete admission assessment since patient sleeping during shift after admitted from the ED

## 2021-04-19 NOTE — Progress Notes (Signed)
Pt refuses cpap for tonight

## 2021-04-19 NOTE — Progress Notes (Signed)
HD#1 SUBJECTIVE:  Patient Summary: [name] is a 55 y.o. with a pertinent PMH of HFpEF (EF 65-70% from 12/09/19), HTN, DM2, morbid obesity, OSA on CPAP who presents to the ED with midsternal 7/10 CP and admitted for acute decompensated heart failure.    Overnight Events: no acute events overnight    Interm History: Patient is much more awake today. Reports that she is feeling better. Her breathing and chest pain have both improved. Some slight swelling still present. Urinating often. Reports she does not yet feel back to baseline. Able to ambulate without dyspnea at home. Has not yet tried to ambulate.  OBJECTIVE:  Vital Signs: Vitals:   04/19/21 0433 04/19/21 0637 04/19/21 0859 04/19/21 0907  BP: 120/77 140/88    Pulse: 83   84  Resp: 16     Temp: 97.7 F (36.5 C)   97.9 F (36.6 C)  TempSrc: Oral   Oral  SpO2: 96%     Weight:   (!) 157.8 kg   Height:   '5\' 7"'$  (1.702 m)    Supplemental O2: Room Air SpO2: 96 % O2 Flow Rate (L/min): 2 L/min  Filed Weights   04/19/21 0140 04/19/21 0859  Weight: (!) 156.9 kg (!) 157.8 kg     Intake/Output Summary (Last 24 hours) at 04/19/2021 1050 Last data filed at 04/19/2021 0146 Gross per 24 hour  Intake 180 ml  Output 800 ml  Net -620 ml    Net IO Since Admission: -1,620 mL [04/19/21 1050]  Physical Exam: Physical Exam Constitutional:      Appearance: She is obese.  Cardiovascular:     Rate and Rhythm: Normal rate.  Pulmonary:     Effort: Pulmonary effort is normal.     Breath sounds: Normal breath sounds.  Abdominal:     General: Bowel sounds are normal.     Palpations: Abdomen is soft.   Physical Exam Constitutional:      Appearance: She is obese.  Cardiovascular:     Rate and Rhythm: Normal rate.  Pulmonary:     Effort: Pulmonary effort is normal.     Breath sounds: Normal breath sounds.  Abdominal:     General: Bowel sounds are normal.     Palpations: Abdomen is soft.     Patient Lines/Drains/Airways  Status     Active Line/Drains/Airways     Name Placement date Placement time Site Days   Peripheral IV 04/17/21 18 G Left Antecubital 04/17/21  1432  Antecubital  1            Pertinent Labs: CBC Latest Ref Rng & Units 04/19/2021 04/18/2021 04/17/2021  WBC 4.0 - 10.5 K/uL 4.7 5.1 5.1  Hemoglobin 12.0 - 15.0 g/dL 10.7(L) 11.3(L) 11.9(L)  Hematocrit 36.0 - 46.0 % 34.8(L) 37.4 39.5  Platelets 150 - 400 K/uL 182 180 204    CMP Latest Ref Rng & Units 04/19/2021 04/18/2021 04/17/2021  Glucose 70 - 99 mg/dL 181(H) 245(H) 296(H)  BUN 6 - 20 mg/dL '19 13 12  '$ Creatinine 0.44 - 1.00 mg/dL 0.93 0.92 0.92  Sodium 135 - 145 mmol/L 137 137 138  Potassium 3.5 - 5.1 mmol/L 4.2 3.2(L) 3.2(L)  Chloride 98 - 111 mmol/L 104 101 100  CO2 22 - 32 mmol/L '26 24 30  '$ Calcium 8.9 - 10.3 mg/dL 9.2 9.1 9.1  Total Protein 6.5 - 8.1 g/dL - 7.0 -  Total Bilirubin 0.3 - 1.2 mg/dL - 0.8 -  Alkaline Phos 38 - 126 U/L -  116 -  AST 15 - 41 U/L - 21 -  ALT 0 - 44 U/L - 11 -    Recent Labs    04/18/21 1718 04/18/21 2132 04/19/21 0755  GLUCAP 225* 275* 169*      Pertinent Imaging: ECHOCARDIOGRAM COMPLETE  Result Date: 04/18/2021    ECHOCARDIOGRAM REPORT   Patient Name:   [name] Date of Exam: 04/18/2021 Medical Rec #:  [id number]     Height:       67.0 in Accession #:    [id number]    Weight:       333.0 lb Date of Birth:  [birth year]     BSA:          2.512 m Patient Age:    27 years      BP:           147/116 mmHg Patient Gender: F             HR:           79 bpm. Exam Location:  Inpatient Procedure: 2D Echo, Cardiac Doppler and Color Doppler Indications:    [id number] Chronic systolic (congestive) heart failure  History:        Patient has prior history of Echocardiogram examinations, most                 recent 12/09/2019. Pulmonary HTN, Signs/Symptoms:Chest Pain; Risk                 Factors:Hypertension, Diabetes, Dyslipidemia and Sleep Apnea.                 Cor Pulmonale.  Sonographer:    Jonelle Sidle Dance  Referring Phys: [id number] ERIK C HOFFMAN  Sonographer Comments: Patient is morbidly obese. Image acquisition challenging due to patient body habitus. IMPRESSIONS  1. Left ventricular ejection fraction, by estimation, is 60 to 65%. The left ventricle has normal function. The left ventricle has no regional wall motion abnormalities. There is severe left ventricular hypertrophy. Left ventricular diastolic function could not be evaluated.  2. Right ventricular systolic function is normal. The right ventricular size is normal. There is moderately elevated pulmonary artery systolic pressure.  3. Left atrial size was severely dilated.  4. Right atrial size was mildly dilated.  5. The mitral valve is normal in structure. Mild mitral valve regurgitation. No evidence of mitral stenosis.  6. Tricuspid valve regurgitation is moderate to severe.  7. The aortic valve is tricuspid. Aortic valve regurgitation is trivial. No aortic stenosis is present.  8. The inferior vena cava is dilated in size with >50% respiratory variability, suggesting right atrial pressure of 8 mmHg. FINDINGS  Left Ventricle: Left ventricular ejection fraction, by estimation, is 60 to 65%. The left ventricle has normal function. The left ventricle has no regional wall motion abnormalities. The left ventricular internal cavity size was normal in size. There is  severe left ventricular hypertrophy. Left ventricular diastolic function could not be evaluated due to atrial fibrillation. Left ventricular diastolic function could not be evaluated. Right Ventricle: The right ventricular size is normal. Right ventricular systolic function is normal. There is moderately elevated pulmonary artery systolic pressure. The tricuspid regurgitant velocity is 2.86 m/s, and with an assumed right atrial pressure of 15 mmHg, the estimated right ventricular systolic pressure is [id number] mmHg. Left Atrium: Left atrial size was severely dilated. Right Atrium: Right atrial size was mildly  dilated. Pericardium: Trivial pericardial effusion is present. Mitral Valve: The mitral valve is normal in  structure. Mild mitral annular calcification. Mild mitral valve regurgitation. No evidence of mitral valve stenosis. Tricuspid Valve: The tricuspid valve is normal in structure. Tricuspid valve regurgitation is moderate to severe. No evidence of tricuspid stenosis. Aortic Valve: The aortic valve is tricuspid. Aortic valve regurgitation is trivial. No aortic stenosis is present. Pulmonic Valve: The pulmonic valve was normal in structure. Pulmonic valve regurgitation is not visualized. No evidence of pulmonic stenosis. Aorta: The aortic root is normal in size and structure. Venous: The inferior vena cava is dilated in size with greater than 50% respiratory variability, suggesting right atrial pressure of 8 mmHg. IAS/Shunts: No atrial level shunt detected by color flow Doppler.  LEFT VENTRICLE PLAX 2D LVIDd:         3.10 cm LVIDs:         2.30 cm LV PW:         1.80 cm LV IVS:        1.60 cm LVOT diam:     2.00 cm LV SV:         54 LV SV Index:   21 LVOT Area:     3.14 cm  RIGHT VENTRICLE          IVC RV Basal diam:  3.60 cm  IVC diam: 3.30 cm RV Mid diam:    3.20 cm TAPSE (M-mode): 1.6 cm LEFT ATRIUM              Index       RIGHT ATRIUM           Index LA diam:        5.80 cm  2.31 cm/m  RA Area:     28.60 cm LA Vol (A2C):   106.0 ml 42.21 ml/m RA Volume:   88.50 ml  35.24 ml/m LA Vol (A4C):   113.0 ml 44.99 ml/m LA Biplane Vol: 111.0 ml 44.20 ml/m  AORTIC VALVE LVOT Vmax:   72.25 cm/s LVOT Vmean:  53.000 cm/s LVOT VTI:    0.171 m  AORTA Ao Root diam: 3.30 cm Ao Asc diam:  3.30 cm MITRAL VALVE                TRICUSPID VALVE MV Area (PHT): 3.56 cm     TR Peak grad:   32.7 mmHg MV Decel Time: 213 msec     TR Vmax:        286.00 cm/s MV E velocity: 131.00 cm/s                             SHUNTS                             Systemic VTI:  0.17 m                             Systemic Diam: 2.00 cm [name] MD Electronically signed by [name] MD Signature Date/Time: 04/18/2021/1:38:36 PM    Final     ASSESSMENT/PLAN:  Assessment: Principal Problem:   Acute on chronic heart failure (HCC) Active Problems:   Class 3 severe obesity due to excess calories with serious comorbidity and body mass index (BMI) of 60.0 to 69.9 in adult [facility])   Acute on chronic congestive heart failure (HCC)   Kambrey Clayson is a 55 y.o. with a pertinent PMH of HFpEF (EF  65-70% from 12/09/19), HTN, DM2, morbid obesity, OSA on CPAP who presents to the ED with midsternal 7/10 CP and admitted for acute decompensated heart failure. on hospital day 1  Plan: Chest pain Acute on chronic heart failure exacerbation Chest pain likely secondary to acute on chronic HF exacerbation. Troponins only mildly elevated (34 -> 193) and I expect this is a type 2 NTEMI. Pt last heart cath showed no signs of significant CAD. Pt has longstanding hx of chronic diastolic congestive heart failure, HFpEF with EF 65-70% (12/09/19), obesity and OSA on CPAP. Pt noncompliant with fluid restriction and missed 2 days of home lasix. Patient reports significant symptom improvement and states she has been urinating frequently. Still some evidence of volume overload. Not yet back to reported baseline  - Continue lasix and monitor I+Os - PT consult placed. Will follow up on their recs.   Diabetes mellitus type 2 Hg A1c 9.7, previous 9.8 (10/12/20) Home regimin includes dulaglutide, glipizide and metformin. -Hold home meds -6 units of mealtimes along with resistant sliding scale and 15 units Lantus QHS   Atrial fibrillation Continue Xarelto and Coreg   HTN -continue home meds amlodipine 10, carvedilol 25, losartan 25   Electrolyte abnormality   -K 3.2, kdur 41mq given in ED. Continue to monitor and replete if necessary. -monitor Mg, and replete with Mg sulfate IV if necessary.  Best Practice: Diet: Cardiac diet and Diabetic diet IVF:  Fluids: none, Rate: None VTE:  Code: Full AB: none  Signature: GDelene Ruffini MD  Internal Medicine Resident, PGY-1 [facility]  Pager: #[phone]:50 AM, 04/19/2021   Please contact the on call pager after 5 pm and on weekends at [phone]

## 2021-04-19 NOTE — Progress Notes (Signed)
PT Cancellation Note  Patient Details Name: [name] MRN: [id number] DOB: 03-Jul-1966   Cancelled Treatment:     Attempted to see patient for initial eval. Patient reports she had just been out of bed and walked to the desk and back. She reports it made her tired and she does not want to try again. Therapy will follow up on 04/20/2021.    Carney Living PT DPT  04/19/2021, 12:51 PM

## 2021-04-20 DIAGNOSIS — I5033 Acute on chronic diastolic (congestive) heart failure: Secondary | ICD-10-CM | POA: Diagnosis not present

## 2021-04-20 LAB — BASIC METABOLIC PANEL
Anion gap: 7 (ref 5–15)
BUN: 21 mg/dL — ABNORMAL HIGH (ref 6–20)
CO2: 26 mmol/L (ref 22–32)
Calcium: 9.2 mg/dL (ref 8.9–10.3)
Chloride: 104 mmol/L (ref 98–111)
Creatinine, Ser: 0.98 mg/dL (ref 0.44–1.00)
GFR, Estimated: >60 mL/min (ref >60)
Glucose, Bld: 206 mg/dL — ABNORMAL HIGH (ref 70–99)
Potassium: 3.9 mmol/L (ref 3.5–5.1)
Sodium: 137 mmol/L (ref 135–145)

## 2021-04-20 LAB — CBC
HCT: 35.2 % — ABNORMAL LOW (ref 36.0–46.0)
Hemoglobin: 10.7 g/dL — ABNORMAL LOW (ref 12.0–15.0)
MCH: 26.6 pg (ref 26.0–34.0)
MCHC: 30.4 g/dL (ref 30.0–36.0)
MCV: 87.3 fL (ref 80.0–100.0)
Platelets: 170 10*3/uL (ref 150–400)
RBC: 4.03 MIL/uL (ref 3.87–5.11)
RDW: 15.4 % (ref 11.5–15.5)
WBC: 5.2 10*3/uL (ref 4.0–10.5)
nRBC: 0 % (ref 0.0–0.2)

## 2021-04-20 LAB — GLUCOSE, CAPILLARY
Glucose-Capillary: 238 mg/dL — ABNORMAL HIGH (ref 70–99)
Glucose-Capillary: 265 mg/dL — ABNORMAL HIGH (ref 70–99)
Glucose-Capillary: 182 mg/dL — ABNORMAL HIGH (ref 70–99)
Glucose-Capillary: 178 mg/dL — ABNORMAL HIGH (ref 70–99)
Glucose-Capillary: 238 mg/dL — ABNORMAL HIGH (ref 70–99)

## 2021-04-20 MED ORDER — FUROSEMIDE 10 MG/ML IJ SOLN
80.0000 mg | Freq: Three times a day (TID) | INTRAMUSCULAR | Status: DC
  Administered 2021-04-20 – 2021-04-23 (×11): 80 mg via INTRAVENOUS
  Filled 2021-04-20 (×8): qty 8

## 2021-04-20 MED ORDER — HYDROXYZINE HCL 25 MG PO TABS
25.0000 mg | ORAL_TABLET | Freq: Three times a day (TID) | ORAL | Status: DC | PRN
  Administered 2021-04-20: 25 mg via ORAL
  Filled 2021-04-20: qty 1

## 2021-04-20 MED ORDER — INSULIN ASPART 100 UNIT/ML IJ SOLN
8.0000 [IU] | Freq: Three times a day (TID) | INTRAMUSCULAR | Status: DC
  Administered 2021-04-20 – 2021-04-23 (×10): 8 [IU] via SUBCUTANEOUS

## 2021-04-20 NOTE — Progress Notes (Signed)
Patient called RN to notify that she did not get enough food, given breaded chicken and macaroni and cheese, [place] ice.  Patient ate everything on tray and would like pizza and soup.  Educated on carb modified diet.  Patient continues to insist she is hungry.  She states she wants to "follow rules" but walking in halls asking staff to order her more food.  MD notified.

## 2021-04-20 NOTE — Progress Notes (Signed)
Patient declines CPAP use at this time. Patient aware to call RT for assistance is she wishes to use CPAP.

## 2021-04-20 NOTE — Evaluation (Signed)
Physical Therapy Evaluation Only Patient Details Name: [name] MRN: [id number] DOB: [birth year] Today's Date: 04/20/2021   History of Present Illness  Pt is a 55 y.o. female who presented 04/17/21 with midsternal CP that radiated to the shoulder and SOB from acute on chronic heart failure exacerbation. PMH of HFpEF (EF 65-70%), hypertension, persistent atrial fibrillation on Xarelto, severe obesity, bipolar disease, cor pulmonale, DM2, SVT.   Clinical Impression  Pt presents with condition above. PTA, she was independent with mobility, but her mother assister her with driving, cleaning, and cooking. She lives with her mother in a 2nd floor apartment without an Media planner. Currently, pt reports and appears to be at her baseline, tolerating ambulating only short community distances due to dyspnea (SpO2 >/= 91% on RA throughout, HR up to 152 bpm). Pt not needing any physical assistance for any functional mobility and is limited primarily by her cardiopulmonary deficits. Recommending follow-up with a Cardiac Rehab program. All education completed and questions answered. No acute PT services needed at this time, PT will sign off. Educated pt to continue mobilizing with nursing staff.    Follow Up Recommendations Other (comment) (Cardiac Rehab)    Equipment Recommendations  None recommended by PT    Recommendations for Other Services       Precautions / Restrictions Precautions Precautions: Other (comment) Precaution Comments: monitor HR and SpO2 Restrictions Weight Bearing Restrictions: No      Mobility  Bed Mobility               General bed mobility comments: Pt sitting up in recliner upon arrival.    Transfers Overall transfer level: Independent Equipment used: None Transfers: Sit to/from Stand Sit to Stand: Independent         General transfer comment: Pt able to transfer to stand quickly without LOB.  Ambulation/Gait Ambulation/Gait assistance: Supervision;Min  guard Gait Distance (Feet): 400 Feet Assistive device: None Gait Pattern/deviations: Wide base of support;Step-through pattern Gait velocity: reduced Gait velocity interpretation: >2.62 ft/sec, indicative of community ambulatory General Gait Details: Pt likely at her baseline gait pattern with wide stance, displaying excessive bil lateral trunk flexion with each step. No LOB but pt weaving in and out close to obstacles at times, min guard-supervision for safety. Reports she is at her baseline, including the dyspnea with mobility. HR up to 152, SpO2 >/= 91% on RA. Took several standing rest breaks to recover HR and decrease dyspnea.  Stairs Stairs: Yes Stairs assistance: Min guard Stair Management: Two rails;Step to pattern;Forwards Number of Stairs: 3 General stair comments: Ascends and descends with trunk flexed and bil hands on bil rails, step-to pattern. Increased time needed to complete, no LOB, min guard for safety.  Wheelchair Mobility    Modified Rankin (Stroke Patients Only)       Balance Overall balance assessment: Mild deficits observed, not formally tested                                           Pertinent Vitals/Pain Pain Assessment: Faces Faces Pain Scale: Hurts a little bit Pain Location: generalized exertion with mobility Pain Descriptors / Indicators: Discomfort Pain Intervention(s): Limited activity within patient's tolerance;Monitored during session;Repositioned    Home Living Family/patient expects to be discharged to:: Private residence Living Arrangements: Parent (mom) Available Help at Discharge: Family;Available 24 hours/day Type of Home: Apartment Home Access: Stairs to enter Entrance Stairs-Rails: Right;Left Entrance  Stairs-Number of Steps: 13 Home Layout: One level Home Equipment: None      Prior Function Level of Independence: Independent         Comments: Mother drives pt. Pt reports her mother assists her with  cleaning and cooking but is otherwise independent. Reports difficulty breathing with walking at baseline, only walking short community distances.     Hand Dominance        Extremity/Trunk Assessment   Upper Extremity Assessment Upper Extremity Assessment: Overall WFL for tasks assessed    Lower Extremity Assessment Lower Extremity Assessment: Overall WFL for tasks assessed       Communication   Communication: No difficulties  Cognition Arousal/Alertness: Awake/alert Behavior During Therapy: WFL for tasks assessed/performed Overall Cognitive Status: Within Functional Limits for tasks assessed                                        General Comments General comments (skin integrity, edema, etc.): HR up to 152 with gait, SpO2 >/= 91% on RA throughout    Exercises     Assessment/Plan    PT Assessment All further PT needs can be met in the next venue of care  PT Problem List Decreased activity tolerance;Decreased balance;Decreased mobility;Cardiopulmonary status limiting activity;Obesity       PT Treatment Interventions      PT Goals (Current goals can be found in the Care Plan section)  Acute Rehab PT Goals Patient Stated Goal: to go to cardiac rehab PT Goal Formulation: All assessment and education complete, DC therapy Time For Goal Achievement: 04/21/21 Potential to Achieve Goals: Good    Frequency     Barriers to discharge        Co-evaluation               AM-PAC PT "6 Clicks" Mobility  Outcome Measure Help needed turning from your back to your side while in a flat bed without using bedrails?: None Help needed moving from lying on your back to sitting on the side of a flat bed without using bedrails?: None Help needed moving to and from a bed to a chair (including a wheelchair)?: None Help needed standing up from a chair using your arms (e.g., wheelchair or bedside chair)?: None Help needed to walk in hospital room?: A Little Help  needed climbing 3-5 steps with a railing? : A Little 6 Click Score: 22    End of Session Equipment Utilized During Treatment: Gait belt Activity Tolerance: Patient tolerated treatment well Patient left: in chair;with call bell/phone within reach Nurse Communication: Mobility status PT Visit Diagnosis: Other abnormalities of gait and mobility (R26.89);Difficulty in walking, not elsewhere classified (R26.2)    Time: 6381-7711 PT Time Calculation (min) (ACUTE ONLY): 12 min   Charges:   PT Evaluation $PT Eval Low Complexity: 1 Low          Moishe Spice, PT, DPT Acute Rehabilitation Services  Pager: [phone] Office: [phone]   Orvan Falconer 04/20/2021, 5:51 PM

## 2021-04-20 NOTE — Progress Notes (Signed)
HD#2 Subjective:  Overnight Events: no acute overnight events reported.   Interim History: Ms [name] was evaluated at bedside this morning. She is sitting up in bedside chair. Reports improvement in breathing overall. Continues to have lower extremity swelling. Reports she has not been using CPAP at home for past 2 months because machine not working. Does not feel like she is back to baseline. Encouraged to participate with physical therapy today  Objective:  Vital signs in last 24 hours: Vitals:   04/19/21 1253 04/19/21 1957 04/20/21 0400 04/20/21 0438  BP: 112/73 (!) 138/91  139/77  Pulse: 86 84  83  Resp: '20 18  20  '$ Temp: 98.2 F (36.8 C) (!) 97.5 F (36.4 C)  98 F (36.7 C)  TempSrc: Oral Oral  Oral  SpO2:  95%  99%  Weight:   (!) 159.2 kg   Height:       Supplemental O2: Room Air SpO2: 99 % O2 Flow Rate (L/min): 2 L/min   Physical Exam:  Physical Exam  Constitutional: Obese female. No distress.  HENT: Normocephalic and atraumatic, EOMI,  Cardiovascular: distant heart sounds, irregular rhythm; S1 and S2 present, no murmurs, rubs, gallops.  Distal pulses intact Respiratory: No respiratory distress, no accessory muscle use.Lungs are clear to auscultation bilaterally. On room air GI: obese abdomen, soft, nontender to palpation, active bowel sounds Musculoskeletal: Normal bulk and tone.  1+ pitting edema of bilateral lower extremities Neurological: Is alert and oriented x4, no apparent focal deficits noted. Skin: Warm and dry.  Bilat lower extremities with skin changes consistent with chronic venous stasis  Psychiatric: Normal mood and affect. Behavior is normal. Judgment and thought content normal.    Filed Weights   04/19/21 0140 04/19/21 0859 04/20/21 0400  Weight: (!) 156.9 kg (!) 157.8 kg (!) 159.2 kg     Intake/Output Summary (Last 24 hours) at 04/20/2021 0654 Last data filed at 04/20/2021 0400 Gross per 24 hour  Intake 240 ml  Output 2300 ml  Net  -2060 ml   Net IO Since Admission: -3,680 mL [04/20/21 0654]  Pertinent Labs: CBC Latest Ref Rng & Units 04/20/2021 04/19/2021 04/18/2021  WBC 4.0 - 10.5 K/uL 5.2 4.7 5.1  Hemoglobin 12.0 - 15.0 g/dL 10.7(L) 10.7(L) 11.3(L)  Hematocrit 36.0 - 46.0 % 35.2(L) 34.8(L) 37.4  Platelets 150 - 400 K/uL 170 182 180    CMP Latest Ref Rng & Units 04/20/2021 04/19/2021 04/18/2021  Glucose 70 - 99 mg/dL 206(H) 181(H) 245(H)  BUN 6 - 20 mg/dL 21(H) 19 13  Creatinine 0.44 - 1.00 mg/dL 0.98 0.93 0.92  Sodium 135 - 145 mmol/L 137 137 137  Potassium 3.5 - 5.1 mmol/L 3.9 4.2 3.2(L)  Chloride 98 - 111 mmol/L 104 104 101  CO2 22 - 32 mmol/L '26 26 24  '$ Calcium 8.9 - 10.3 mg/dL 9.2 9.2 9.1  Total Protein 6.5 - 8.1 g/dL - - 7.0  Total Bilirubin 0.3 - 1.2 mg/dL - - 0.8  Alkaline Phos 38 - 126 U/L - - 116  AST 15 - 41 U/L - - 21  ALT 0 - 44 U/L - - 11    Imaging: No results found.  Assessment/Plan:   Principal Problem:   Acute on chronic heart failure (HCC) Active Problems:   Class 3 severe obesity due to excess calories with serious comorbidity and body mass index (BMI) of 60.0 to 69.9 in adult [facility])   Acute on chronic congestive heart failure [facility])   Patient Summary:  [name] is a 55 y.o. with a pertinent PMH of HFpEF (EF 65-70%), hypertension, persistent atrial fibrillation on Xarelto, severe obesity, who presented with shortness and breath and chest pain and admitted for acute on chronic diastolic heart failure.   Acute on chronic diastolic heart failure Patient with history of HFpEF (EF 65-70%) admitted with worsening dyspnea in setting of medication and dietary noncompliance for two days duration. She has been diuresing with IV Lasix with 2.3L UOP over past 24 hours. Continues to be hypervolemic on examination and endorses some ongoing dyspnea.  - Cardiology consulted, appreciate recommendations - IV Lasix '80mg'$  increased to tid  - Strict I&O monitoring - Daily weights - Monitor and  replete electrolytes as needed   Persistent atrial fibrillation Has had prior unsuccessful attempts at cardioversion in the past. Currently rate controlled.  - Continue coreg '25mg'$  bid  - Continue Xarelto '20mg'$  daily   Atypical chest pain Elevated troponin Presented with atypical chest pain with peak troponin of 1132, trending down. Prior cath without evidence of CAD. Currently does not have any chest pain - Cardiology consulted, appreciate recommendations  - No further ischemic work up at this time   Type II diabetes mellitus One CBG of 91, remainder of have >200. Will adjust meal time insulin  Lantus 15U + Novolog 8U + SSI   OSA on CPAP Patient reports not having working CPAP machine at home for past two months. She has been declining CPAP during admission.  - Encouraged for CPAP nightly   Hypertension  Continue losartan '25mg'$  daily  Continue amlodipine '10mg'$  daily  Continue hydralazine '25mg'$  q8h  Diet: Heart Healthy Carb modified IVF: None,None VTE: NOAC Code: Full PT/OT recs: Pending, none.  Prior to Admission Living Arrangement: Home Anticipated Discharge Location: Home w/HH vs SNF Barriers to Discharge: Continued medical management  Dispo: Anticipated discharge to Home in 1-2 days pending clinical improvement.   Harvie Heck, MD Internal Medicine Resident PGY-3 Pager# [phone]  Please contact the on call pager after 5 pm and on weekends at [phone].

## 2021-04-20 NOTE — Progress Notes (Signed)
Patient c/o itching all over body.  No redness or hives noted.  MD notified, order for hydroxyzine placed.  Will continue to monitor.

## 2021-04-20 NOTE — Progress Notes (Signed)
Progress Note  Patient Name: Mircale Cheah Date of Encounter: 04/20/2021  [facility] Cardiologist: [name], MD   Subjective   SOB is improving  Inpatient Medications    Scheduled Meds:  amLODipine  10 mg Oral Daily   carvedilol  25 mg Oral BID WC   furosemide  80 mg Intravenous BID   hydrALAZINE  25 mg Oral Q8H   insulin aspart  0-20 Units Subcutaneous TID WC   insulin aspart  0-5 Units Subcutaneous QHS   insulin aspart  6 Units Subcutaneous TID WC   insulin glargine  15 Units Subcutaneous QHS   losartan  25 mg Oral Daily   oxybutynin  5 mg Oral TID   risperidone  4 mg Oral Daily   rivaroxaban  20 mg Oral Q supper   Continuous Infusions:  PRN Meds: bisacodyl, ibuprofen, lidocaine, nitroGLYCERIN, senna-docusate   Vital Signs    Vitals:   04/19/21 1253 04/19/21 1957 04/20/21 0400 04/20/21 0438  BP: 112/73 (!) 138/91  139/77  Pulse: 86 84  83  Resp: '20 18  20  '$ Temp: 98.2 F (36.8 C) (!) 97.5 F (36.4 C)  98 F (36.7 C)  TempSrc: Oral Oral  Oral  SpO2:  95%  99%  Weight:   (!) 159.2 kg   Height:        Intake/Output Summary (Last 24 hours) at 04/20/2021 0803 Last data filed at 04/20/2021 0400 Gross per 24 hour  Intake 240 ml  Output 2300 ml  Net -2060 ml   Last 3 Weights 04/20/2021 04/19/2021 04/19/2021  Weight (lbs) 350 lb 14.4 oz 347 lb 12.8 oz 346 lb  Weight (kg) 159.167 kg 157.761 kg 156.945 kg      Telemetry    Rate controled afib - Personally Reviewed  ECG    N/a - Personally Reviewed  Physical Exam   GEN: No acute distress.   Neck: No JVD Cardiac: irreg, no murmurs, rubs, or gallops.  Respiratory: Clear to auscultation bilaterally. GI: Soft, nontender, non-distended  MS:1+ bilateral LE edema; No deformity. Neuro:  Nonfocal  Psych: Normal affect   Labs    High Sensitivity Troponin:   Recent Labs  Lab 04/02/21 2348 04/17/21 1445 04/17/21 1645 04/18/21 0026 04/18/21 0226  TROPONINIHS 3 34* 193* 1,132* 901*       Chemistry Recent Labs  Lab 04/18/21 0226 04/19/21 0416 04/20/21 0254  NA 137 137 137  K 3.2* 4.2 3.9  CL 101 104 104  CO2 '24 26 26  '$ GLUCOSE 245* 181* 206*  BUN 13 19 21*  CREATININE 0.92 0.93 0.98  CALCIUM 9.1 9.2 9.2  PROT 7.0  --   --   ALBUMIN 3.3*  --   --   AST 21  --   --   ALT 11  --   --   ALKPHOS 116  --   --   BILITOT 0.8  --   --   GFRNONAA >60 >60 >60  ANIONGAP '12 7 7     '$ Hematology Recent Labs  Lab 04/18/21 0326 04/19/21 0416 04/20/21 0254  WBC 5.1 4.7 5.2  RBC 4.27 4.00 4.03  HGB 11.3* 10.7* 10.7*  HCT 37.4 34.8* 35.2*  MCV 87.6 87.0 87.3  MCH 26.5 26.8 26.6  MCHC 30.2 30.7 30.4  RDW 15.4 15.3 15.4  PLT 180 182 170    BNP Recent Labs  Lab 04/17/21 1445  BNP 246.1*     DDimer No results for input(s): DDIMER in the last  168 hours.   Radiology    ECHOCARDIOGRAM COMPLETE  Result Date: 04/18/2021    ECHOCARDIOGRAM REPORT   Patient Name:   LUIS ZEPF Date of Exam: 04/18/2021 Medical Rec #:  [id number]     Height:       67.0 in Accession #:    [id number]    Weight:       333.0 lb Date of Birth:  Feb 26, 1966     BSA:          2.512 m Patient Age:    55 years      BP:           147/116 mmHg Patient Gender: F             HR:           79 bpm. Exam Location:  Inpatient Procedure: 2D Echo, Cardiac Doppler and Color Doppler Indications:    [id number] Chronic systolic (congestive) heart failure  History:        Patient has prior history of Echocardiogram examinations, most                 recent 12/09/2019. Pulmonary HTN, Signs/Symptoms:Chest Pain; Risk                 Factors:Hypertension, Diabetes, Dyslipidemia and Sleep Apnea.                 Cor [place].  Sonographer:    Jonelle Sidle Dance Referring Phys: [id number] ERIK C HOFFMAN  Sonographer Comments: Patient is morbidly obese. Image acquisition challenging due to patient body habitus. IMPRESSIONS  1. Left ventricular ejection fraction, by estimation, is 60 to 65%. The left ventricle has normal function. The left  ventricle has no regional wall motion abnormalities. There is severe left ventricular hypertrophy. Left ventricular diastolic function could not be evaluated.  2. Right ventricular systolic function is normal. The right ventricular size is normal. There is moderately elevated pulmonary artery systolic pressure.  3. Left atrial size was severely dilated.  4. Right atrial size was mildly dilated.  5. The mitral valve is normal in structure. Mild mitral valve regurgitation. No evidence of mitral stenosis.  6. Tricuspid valve regurgitation is moderate to severe.  7. The aortic valve is tricuspid. Aortic valve regurgitation is trivial. No aortic stenosis is present.  8. The inferior vena cava is dilated in size with >50% respiratory variability, suggesting right atrial pressure of 8 mmHg. FINDINGS  Left Ventricle: Left ventricular ejection fraction, by estimation, is 60 to 65%. The left ventricle has normal function. The left ventricle has no regional wall motion abnormalities. The left ventricular internal cavity size was normal in size. There is  severe left ventricular hypertrophy. Left ventricular diastolic function could not be evaluated due to atrial fibrillation. Left ventricular diastolic function could not be evaluated. Right Ventricle: The right ventricular size is normal. Right ventricular systolic function is normal. There is moderately elevated pulmonary artery systolic pressure. The tricuspid regurgitant velocity is 2.86 m/s, and with an assumed right atrial pressure of 15 mmHg, the estimated right ventricular systolic pressure is [id number] mmHg. Left Atrium: Left atrial size was severely dilated. Right Atrium: Right atrial size was mildly dilated. Pericardium: Trivial pericardial effusion is present. Mitral Valve: The mitral valve is normal in structure. Mild mitral annular calcification. Mild mitral valve regurgitation. No evidence of mitral valve stenosis. Tricuspid Valve: The tricuspid valve is normal in  structure. Tricuspid valve regurgitation is moderate to severe. No evidence of tricuspid stenosis. Aortic Valve:  The aortic valve is tricuspid. Aortic valve regurgitation is trivial. No aortic stenosis is present. Pulmonic Valve: The pulmonic valve was normal in structure. Pulmonic valve regurgitation is not visualized. No evidence of pulmonic stenosis. Aorta: The aortic root is normal in size and structure. Venous: The inferior vena cava is dilated in size with greater than 50% respiratory variability, suggesting right atrial pressure of 8 mmHg. IAS/Shunts: No atrial level shunt detected by color flow Doppler.  LEFT VENTRICLE PLAX 2D LVIDd:         3.10 cm LVIDs:         2.30 cm LV PW:         1.80 cm LV IVS:        1.60 cm LVOT diam:     2.00 cm LV SV:         54 LV SV Index:   21 LVOT Area:     3.14 cm  RIGHT VENTRICLE          IVC RV Basal diam:  3.60 cm  IVC diam: 3.30 cm RV Mid diam:    3.20 cm TAPSE (M-mode): 1.6 cm LEFT ATRIUM              Index       RIGHT ATRIUM           Index LA diam:        5.80 cm  2.31 cm/m  RA Area:     28.60 cm LA Vol (A2C):   106.0 ml 42.21 ml/m RA Volume:   88.50 ml  35.24 ml/m LA Vol (A4C):   113.0 ml 44.99 ml/m LA Biplane Vol: 111.0 ml 44.20 ml/m  AORTIC VALVE LVOT Vmax:   72.25 cm/s LVOT Vmean:  53.000 cm/s LVOT VTI:    0.171 m  AORTA Ao Root diam: 3.30 cm Ao Asc diam:  3.30 cm MITRAL VALVE                TRICUSPID VALVE MV Area (PHT): 3.56 cm     TR Peak grad:   32.7 mmHg MV Decel Time: 213 msec     TR Vmax:        286.00 cm/s MV E velocity: 131.00 cm/s                             SHUNTS                             Systemic VTI:  0.17 m                             Systemic Diam: 2.00 cm [name] MD Electronically signed by [name] MD Signature Date/Time: 04/18/2021/1:38:36 PM    Final     Cardiac Studies    Patient Profile     55 y.o. 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  Assessment & Plan  1.Chest pain/elevated troponin - presented with somewhat atypical chest pain - peak trop 1132, trending down. Cath 2 years ago with normal coronaries -from prior roudning notes thought to be  demand ischemia in setting of HTN, HF, LVH. No plans for ischemic testing.     2. Acute on chronic diastolic HF - echo LVEF [id number], no WMAs, severe LVH, indet diastolif fx, normal RV,moderately elevated PASP, mod to severe TR, - total I/Os incomplete for admission, yesterday was net negative 2L. Standing weight today 350 lbs essentially stable from yestedays standing weight. She is on IV lasix '80mg'$  bid, renal function is stable. From 11/2020 clinic visit weight was 341 lbs.   -increase lasix to '80mg'$  tid today and follow weights and output. REmains fluid overloaded        3. HTN - presented with significant HTN 160s/100s - bp's up and down but essentialyl at goal, follow with further diuresis.      4. Persistent afib - Hx of multiple attempts at [place] in the past. - continue rate control    For questions or updates, please contact [place] Please consult [email or web address] for contact info under        Signed, Carlyle Dolly, MD  04/20/2021, 8:03 AM

## 2021-04-21 ENCOUNTER — Other Ambulatory Visit: Payer: Self-pay

## 2021-04-21 DIAGNOSIS — I5033 Acute on chronic diastolic (congestive) heart failure: Secondary | ICD-10-CM | POA: Diagnosis not present

## 2021-04-21 DIAGNOSIS — Z6841 Body Mass Index (BMI) 40.0 and over, adult: Secondary | ICD-10-CM | POA: Diagnosis not present

## 2021-04-21 DIAGNOSIS — G4733 Obstructive sleep apnea (adult) (pediatric): Secondary | ICD-10-CM | POA: Diagnosis not present

## 2021-04-21 DIAGNOSIS — I5043 Acute on chronic combined systolic (congestive) and diastolic (congestive) heart failure: Secondary | ICD-10-CM | POA: Diagnosis not present

## 2021-04-21 LAB — BASIC METABOLIC PANEL
Anion gap: 10 (ref 5–15)
BUN: 27 mg/dL — ABNORMAL HIGH (ref 6–20)
CO2: 26 mmol/L (ref 22–32)
Calcium: 9 mg/dL (ref 8.9–10.3)
Chloride: 98 mmol/L (ref 98–111)
Creatinine, Ser: 0.98 mg/dL (ref 0.44–1.00)
GFR, Estimated: >60 mL/min (ref >60)
Glucose, Bld: 306 mg/dL — ABNORMAL HIGH (ref 70–99)
Potassium: 3.8 mmol/L (ref 3.5–5.1)
Sodium: 134 mmol/L — ABNORMAL LOW (ref 135–145)

## 2021-04-21 LAB — GLUCOSE, CAPILLARY
Glucose-Capillary: 243 mg/dL — ABNORMAL HIGH (ref 70–99)
Glucose-Capillary: 220 mg/dL — ABNORMAL HIGH (ref 70–99)
Glucose-Capillary: 189 mg/dL — ABNORMAL HIGH (ref 70–99)
Glucose-Capillary: 118 mg/dL — ABNORMAL HIGH (ref 70–99)

## 2021-04-21 LAB — CBC
HCT: 36.4 % (ref 36.0–46.0)
Hemoglobin: 11.3 g/dL — ABNORMAL LOW (ref 12.0–15.0)
MCH: 26.5 pg (ref 26.0–34.0)
MCHC: 31 g/dL (ref 30.0–36.0)
MCV: 85.4 fL (ref 80.0–100.0)
Platelets: 184 10*3/uL (ref 150–400)
RBC: 4.26 MIL/uL (ref 3.87–5.11)
RDW: 15.4 % (ref 11.5–15.5)
WBC: 5.9 10*3/uL (ref 4.0–10.5)
nRBC: 0 % (ref 0.0–0.2)

## 2021-04-21 LAB — MAGNESIUM: Magnesium: 1.9 mg/dL (ref 1.7–2.4)

## 2021-04-21 LAB — BLOOD GAS, ARTERIAL
Acid-Base Excess: 1.5 mmol/L (ref 0.0–2.0)
Bicarbonate: 26.1 mmol/L (ref 20.0–28.0)
Drawn by: 30136
FIO2: 21
O2 Saturation: 94.8 %
Patient temperature: 37
pCO2 arterial: 45.4 mmHg (ref 32.0–48.0)
pH, Arterial: 7.377 (ref 7.350–7.450)
pO2, Arterial: 77.1 mmHg — ABNORMAL LOW (ref 83.0–108.0)

## 2021-04-21 MED ORDER — EMPAGLIFLOZIN 10 MG PO TABS
10.0000 mg | ORAL_TABLET | Freq: Every day | ORAL | Status: DC
  Administered 2021-04-21 – 2021-04-23 (×3): 10 mg via ORAL
  Filled 2021-04-21 (×3): qty 1

## 2021-04-21 MED ORDER — MAGNESIUM SULFATE IN D5W 1-5 GM/100ML-% IV SOLN
1.0000 g | Freq: Once | INTRAVENOUS | Status: AC
  Administered 2021-04-21: 1 g via INTRAVENOUS
  Filled 2021-04-21: qty 100

## 2021-04-21 MED ORDER — INSULIN GLARGINE 100 UNIT/ML ~~LOC~~ SOLN
22.0000 [IU] | Freq: Every day | SUBCUTANEOUS | Status: DC
  Administered 2021-04-21 – 2021-04-22 (×2): 22 [IU] via SUBCUTANEOUS
  Filled 2021-04-21 (×4): qty 0.22

## 2021-04-21 MED ORDER — POTASSIUM CHLORIDE CRYS ER 20 MEQ PO TBCR
40.0000 meq | EXTENDED_RELEASE_TABLET | Freq: Once | ORAL | Status: AC
  Administered 2021-04-21: 40 meq via ORAL
  Filled 2021-04-21: qty 2

## 2021-04-21 NOTE — Progress Notes (Signed)
HD#3 Subjective:  Overnight Events: no acute overnight events reported.   Interim History: Patient seen and evaluated at bedside. She reports that she is feeling better today. Breathing improved. Remains sleepy and lethargic.   Objective:  Vital signs in last 24 hours: Vitals:   04/20/21 2253 04/21/21 0418 04/21/21 0623 04/21/21 1227  BP: (!) 119/57 135/86  (!) 143/94  Pulse: 93 89  88  Resp: '18 18  18  '$ Temp: 98.4 F (36.9 C) 98.6 F (37 C)  98.3 F (36.8 C)  TempSrc: Oral Oral  Oral  SpO2: 97% 94%    Weight:   (!) 159.9 kg   Height:       Supplemental O2: Room Air SpO2: 94 % O2 Flow Rate (L/min): 2 L/min   Physical Exam:  Physical Exam  Constitutional: Obese female. No distress.  HENT: Normocephalic and atraumatic, EOMI,  Cardiovascular: distant heart sounds, irregular rhythm; S1 and S2 present, no murmurs, rubs, gallops.  Distal pulses intact Respiratory: No respiratory distress, no accessory muscle use.Lungs are clear to auscultation bilaterally. On room air GI: obese abdomen, soft, nontender to palpation, active bowel sounds Musculoskeletal: Normal bulk and tone.  1+ pitting edema of bilateral lower extremities Neurological: Is alert and oriented x4, no apparent focal deficits noted. Skin: Warm and dry.  Bilat lower extremities with skin changes consistent with chronic venous stasis  Psychiatric: Normal mood and affect. Behavior is normal. Judgment and thought content normal.    Filed Weights   04/19/21 0859 04/20/21 0400 04/21/21 0623  Weight: (!) 157.8 kg (!) 159.2 kg (!) 159.9 kg     Intake/Output Summary (Last 24 hours) at 04/21/2021 1649 Last data filed at 04/20/2021 1700 Gross per 24 hour  Intake 240 ml  Output 650 ml  Net -410 ml    Net IO Since Admission: -4,420 mL [04/21/21 1649]  Pertinent Labs: CBC Latest Ref Rng & Units 04/21/2021 04/20/2021 04/19/2021  WBC 4.0 - 10.5 K/uL 5.9 5.2 4.7  Hemoglobin 12.0 - 15.0 g/dL 11.3(L) 10.7(L) 10.7(L)   Hematocrit 36.0 - 46.0 % 36.4 35.2(L) 34.8(L)  Platelets 150 - 400 K/uL 184 170 182    CMP Latest Ref Rng & Units 04/21/2021 04/20/2021 04/19/2021  Glucose 70 - 99 mg/dL 306(H) 206(H) 181(H)  BUN 6 - 20 mg/dL 27(H) 21(H) 19  Creatinine 0.44 - 1.00 mg/dL 0.98 0.98 0.93  Sodium 135 - 145 mmol/L 134(L) 137 137  Potassium 3.5 - 5.1 mmol/L 3.8 3.9 4.2  Chloride 98 - 111 mmol/L 98 104 104  CO2 22 - 32 mmol/L '26 26 26  '$ Calcium 8.9 - 10.3 mg/dL 9.0 9.2 9.2  Total Protein 6.5 - 8.1 g/dL - - -  Total Bilirubin 0.3 - 1.2 mg/dL - - -  Alkaline Phos 38 - 126 U/L - - -  AST 15 - 41 U/L - - -  ALT 0 - 44 U/L - - -    Imaging: No results found.  Assessment/Plan:   Principal Problem:   Acute on chronic heart failure (HCC) Active Problems:   Class 3 severe obesity due to excess calories with serious comorbidity and body mass index (BMI) of 60.0 to 69.9 in adult [facility])   Acute on chronic congestive heart failure [facility])   Patient Summary: [name] is a 55 y.o. with a pertinent PMH of HFpEF (EF 65-70%), hypertension, persistent atrial fibrillation on Xarelto, severe obesity, who presented with shortness and breath and chest pain and admitted for acute on chronic diastolic  heart failure.   Acute on chronic diastolic heart failure Patient with history of HFpEF (EF 65-70%) admitted with worsening dyspnea in setting of medication and dietary noncompliance for two days duration. She has been diuresing with IV Lasix with 1.7L UOP over past 24 hours. Continues to be hypervolemic on examination and endorses some ongoing dyspnea.  - Cardiology consulted, appreciate recommendations - IV Lasix '80mg'$  TID  - Strict I&O monitoring - Daily weights - Monitor and replete electrolytes as needed   Persistent atrial fibrillation Has had prior unsuccessful attempts at cardioversion in the past. Currently rate controlled.  - Continue coreg '25mg'$  bid  - Continue Xarelto '20mg'$  daily   Atypical chest pain Elevated  troponin Presented with atypical chest pain with peak troponin of 1132, trending down. Prior cath without evidence of CAD. Currently does not have any chest pain - Cardiology consulted, appreciate recommendations  - No further ischemic work up at this time   Type II diabetes mellitus One CBG of 91, remainder of have >200. Will adjust meal time insulin  Lantus 15U + Novolog 8U + SSI   OSA on CPAP Patient reports not having working CPAP machine at home for past two months. She has been declining CPAP during admission.  - Encouraged for CPAP nightly   Hypertension  Continue losartan '25mg'$  daily  Continue amlodipine '10mg'$  daily  Continue hydralazine '25mg'$  q8h  Diet: Heart Healthy Carb modified IVF: None,None VTE: NOAC Code: Full PT/OT recs: Pending, none.  Prior to Admission Living Arrangement: Home Anticipated Discharge Location: Home w/HH vs SNF Barriers to Discharge: Continued medical management  Dispo: Anticipated discharge to Home in 1-2 days pending clinical improvement.   Harvie Heck, MD Internal Medicine Resident PGY-3 Pager# [phone]  Please contact the on call pager after 5 pm and on weekends at [phone].

## 2021-04-21 NOTE — Progress Notes (Signed)
   04/21/21 1310  Clinical Encounter Type  Visited With Patient  Visit Type Initial  Referral From Nurse  Consult/Referral To Chaplain   Chaplain responded and provided Advance Directive education. The patient stated she does not think she will need. This note was prepared by Jeanine Luz, M.Div..  For questions please contact by phone [phone].

## 2021-04-21 NOTE — Progress Notes (Signed)
Inpatient Diabetes Program Recommendations  AACE/ADA: New Consensus Statement on Inpatient Glycemic Control (2015)  Target Ranges:  Prepandial:   less than 140 mg/dL      Peak postprandial:   less than 180 mg/dL (1-2 hours)      Critically ill patients:  140 - 180 mg/dL   Lab Results  Component Value Date   GLUCAP 243 (H) 04/21/2021   HGBA1C 9.7 (H) 04/17/2021    Review of Glycemic Control Results for MILIYAH, MCCREA (MRN [id number]) as of 04/21/2021 09:11  Ref. Range 04/20/2021 07:56 04/20/2021 11:19 04/20/2021 16:47 04/20/2021 19:35 04/20/2021 21:50 04/20/2021 22:43 04/21/2021 07:12  Glucose-Capillary Latest Ref Range: 70 - 99 mg/dL 238 (H) Novolog 13 units 265 (H) Novolog 19 units  182 (H) Novolog 12 units 178 (H) 238 (H) Lantus 15 units 243 (H)    Diabetes history: DM 2 Outpatient Diabetes medications: Trulicity 1.5 mg wkly, glipizide 10 mg Daily, Metformin 1000 mg bid Current orders for Inpatient glycemic control:  Lantus 15 units Novolog 0-20 units tid + hs Novolog 8 units tid meal coverage  Inpatient Diabetes Program Recommendations:    Lantus 22 units QHS Novolog 0-15 units TID & 0-5 units QHS  Will continue to follow while inpatient.  Thank you, [name], RN, BSN Diabetes Coordinator Inpatient Diabetes Program [phone] (team pager from 8a-5p)

## 2021-04-21 NOTE — Progress Notes (Signed)
Pt refuses CPAP 

## 2021-04-21 NOTE — Care Management (Signed)
04-21-21 Patient has Medicaid in the system. The co pay for Jardiance should be no more than $3.00. [name] may need a prior authorization and the Medicaid Authorization # is [id number]. No further needs identified at this time.

## 2021-04-21 NOTE — Progress Notes (Addendum)
Nutrition Consult - Diet Education  Received consult from RN regarding heart healthy, carbohydrate modified diet education for this patient. RD attempted to speak with patient twice today, but both times she was sleeping. She barely woke up the first visit and she did not wake up at all the second visit. Left "Heart Healthy Carbohydrate Consistent Nutrition Therapy" handout at bedside for patient to review. On previous admission in 2021, RD attempted to educate patient without success. Patient was referred to [facility] and was seen by a RD 3 times there. She was considering bariatric surgery at that time. Recommend restart outpatient education at [facility].   Lucas Mallow, RD, LDN, CNSC Please refer to [facility] for contact information.

## 2021-04-21 NOTE — Progress Notes (Addendum)
The patient has been seen in conjunction with Almyra Deforest, PAC. All aspects of care have been considered and discussed. The patient has been personally interviewed, examined, and all clinical data has been reviewed.  Why is she so sleepy and lethargic? Agree with therapy for DIASTOLIC HF: SGLT-2, diuretic therapy possibly spironolactone. Check arterial BG to exclude CO2 retention. No plan for ischemic eval at this time due to obesity and other co-morbid conditions.   Progress Note  Patient Name: [name] Date of Encounter: 04/21/2021  [facility] Cardiologist: [name], MD   Subjective   Asleep during interview, did not wake up.   Inpatient Medications    Scheduled Meds:  amLODipine  10 mg Oral Daily   carvedilol  25 mg Oral BID WC   furosemide  80 mg Intravenous Q8H   hydrALAZINE  25 mg Oral Q8H   insulin aspart  0-20 Units Subcutaneous TID WC   insulin aspart  0-5 Units Subcutaneous QHS   insulin aspart  8 Units Subcutaneous TID WC   insulin glargine  15 Units Subcutaneous QHS   losartan  25 mg Oral Daily   oxybutynin  5 mg Oral TID   risperidone  4 mg Oral Daily   rivaroxaban  20 mg Oral Q supper   Continuous Infusions:  PRN Meds: bisacodyl, hydrOXYzine, ibuprofen, lidocaine, nitroGLYCERIN, senna-docusate   Vital Signs    Vitals:   04/20/21 1512 04/20/21 2253 04/21/21 0418 04/21/21 0623  BP: (!) 116/92 (!) 119/57 135/86   Pulse: 83 93 89   Resp:  18 18   Temp: 97.8 F (36.6 C) 98.4 F (36.9 C) 98.6 F (37 C)   TempSrc: Oral Oral Oral   SpO2: 98% 97% 94%   Weight:    (!) 159.9 kg  Height:        Intake/Output Summary (Last 24 hours) at 04/21/2021 0911 Last data filed at 04/20/2021 1700 Gross per 24 hour  Intake 720 ml  Output 1450 ml  Net -730 ml   Last 3 Weights 04/21/2021 04/20/2021 04/19/2021  Weight (lbs) 352 lb 8 oz 350 lb 14.4 oz 347 lb 12.8 oz  Weight (kg) 159.893 kg 159.167 kg 157.761 kg      Telemetry    Atrial fibrillation  with HR 90s - Personally Reviewed  ECG    Atrial fibrillation with poor R wave progression in the anterior leads - Personally Reviewed  Physical Exam   GEN: No acute distress.   Neck: No JVD Cardiac: irregularly irregular, no murmurs, rubs, or gallops.  Respiratory: Clear to auscultation bilaterally. GI: Soft, nontender, non-distended  MS: No edema; No deformity. Neuro:  asleep, unable to examine  Psych: asleep, unable to examine  Labs    High Sensitivity Troponin:   Recent Labs  Lab 04/02/21 2348 04/17/21 1445 04/17/21 1645 04/18/21 0026 04/18/21 0226  TROPONINIHS 3 34* 193* 1,132* 901*      Chemistry Recent Labs  Lab 04/18/21 0226 04/19/21 0416 04/20/21 0254 04/21/21 0355  NA 137 137 137 134*  K 3.2* 4.2 3.9 3.8  CL 101 104 104 98  CO2 '24 26 26 26  '$ GLUCOSE 245* 181* 206* 306*  BUN 13 19 21* 27*  CREATININE 0.92 0.93 0.98 0.98  CALCIUM 9.1 9.2 9.2 9.0  PROT 7.0  --   --   --   ALBUMIN 3.3*  --   --   --   AST 21  --   --   --   ALT 11  --   --   --  ALKPHOS 116  --   --   --   BILITOT 0.8  --   --   --   GFRNONAA >60 >60 >60 >60  ANIONGAP '12 7 7 10     '$ Hematology Recent Labs  Lab 04/19/21 0416 04/20/21 0254 04/21/21 0355  WBC 4.7 5.2 5.9  RBC 4.00 4.03 4.26  HGB 10.7* 10.7* 11.3*  HCT 34.8* 35.2* 36.4  MCV 87.0 87.3 85.4  MCH 26.8 26.6 26.5  MCHC 30.7 30.4 31.0  RDW 15.3 15.4 15.4  PLT 182 170 184    BNP Recent Labs  Lab 04/17/21 1445  BNP 246.1*     DDimer No results for input(s): DDIMER in the last 168 hours.   Radiology    No results found.  Cardiac Studies   Cath 04/18/2021   1. Left ventricular ejection fraction, by estimation, is 60 to 65%. The  left ventricle has normal function. The left ventricle has no regional  wall motion abnormalities. There is severe left ventricular hypertrophy.  Left ventricular diastolic function  could not be evaluated.   2. Right ventricular systolic function is normal. The right  ventricular  size is normal. There is moderately elevated pulmonary artery systolic  pressure.   3. Left atrial size was severely dilated.   4. Right atrial size was mildly dilated.   5. The mitral valve is normal in structure. Mild mitral valve  regurgitation. No evidence of mitral stenosis.   6. Tricuspid valve regurgitation is moderate to severe.   7. The aortic valve is tricuspid. Aortic valve regurgitation is trivial.  No aortic stenosis is present.   8. The inferior vena cava is dilated in size with >50% respiratory  variability, suggesting right atrial pressure of 8 mmHg.   Patient Profile     55 y.o. female with PMH of chronic diastolic CHF, HTN, persistent afib, HTN, anomalous RCA arising from L cusp with intraarterial course who presented on 04/17/2021 with acute decompensated CHF and chest pain  Assessment & Plan    Chest pain  - elevated troponin with atypical chest pain, hs trop 34 --> 193 --> 1132 --> 901   - cath 2 years ago with normal coronary arteries  - felt to be demand ischemia with no plan for ischemic testing  Acute on chronic diastolic CHF  - I/O -4.4 L, weight actually increasing.   - Echo 60-65%, severe LAE, moderate to severe TR, mild MR.   - continue IV lasix '80mg'$  TID dosing. Weight not accurate. She was intolerant of farxiga (hallucination and low blood glucose), I checked with our clinical pharmacist, unlikely explaination for intolerant. Will attempt Jardiance as a trial to see if she can tolerate it which will help with both weight loss and CHF.   HTN: Coreg, amlodipine, hydralazine and losartan  Persistent afib: on Creg and Xarelto. Continue in afib with HR 90s      For questions or updates, please contact [place] Please consult [email or web address] for contact info under        Signed, Almyra Deforest, [place]  04/21/2021, 9:11 AM

## 2021-04-22 ENCOUNTER — Other Ambulatory Visit: Payer: Self-pay

## 2021-04-22 DIAGNOSIS — I5043 Acute on chronic combined systolic (congestive) and diastolic (congestive) heart failure: Secondary | ICD-10-CM

## 2021-04-22 DIAGNOSIS — Z6841 Body Mass Index (BMI) 40.0 and over, adult: Secondary | ICD-10-CM

## 2021-04-22 DIAGNOSIS — E669 Obesity, unspecified: Secondary | ICD-10-CM | POA: Diagnosis not present

## 2021-04-22 DIAGNOSIS — I5033 Acute on chronic diastolic (congestive) heart failure: Secondary | ICD-10-CM | POA: Diagnosis not present

## 2021-04-22 DIAGNOSIS — I509 Heart failure, unspecified: Secondary | ICD-10-CM | POA: Diagnosis not present

## 2021-04-22 DIAGNOSIS — G4733 Obstructive sleep apnea (adult) (pediatric): Secondary | ICD-10-CM | POA: Diagnosis not present

## 2021-04-22 LAB — COMPREHENSIVE METABOLIC PANEL
ALT: 12 U/L (ref 0–44)
AST: 20 U/L (ref 15–41)
Albumin: 3 g/dL — ABNORMAL LOW (ref 3.5–5.0)
Alkaline Phosphatase: 94 U/L (ref 38–126)
Anion gap: 7 (ref 5–15)
BUN: 27 mg/dL — ABNORMAL HIGH (ref 6–20)
CO2: 27 mmol/L (ref 22–32)
Calcium: 8.8 mg/dL — ABNORMAL LOW (ref 8.9–10.3)
Chloride: 105 mmol/L (ref 98–111)
Creatinine, Ser: 1.11 mg/dL — ABNORMAL HIGH (ref 0.44–1.00)
GFR, Estimated: 59 mL/min — ABNORMAL LOW (ref >60)
Glucose, Bld: 119 mg/dL — ABNORMAL HIGH (ref 70–99)
Potassium: 3.9 mmol/L (ref 3.5–5.1)
Sodium: 139 mmol/L (ref 135–145)
Total Bilirubin: 0.4 mg/dL (ref 0.3–1.2)
Total Protein: 6.3 g/dL — ABNORMAL LOW (ref 6.5–8.1)

## 2021-04-22 LAB — CBC
HCT: 34.6 % — ABNORMAL LOW (ref 36.0–46.0)
Hemoglobin: 10.8 g/dL — ABNORMAL LOW (ref 12.0–15.0)
MCH: 26.8 pg (ref 26.0–34.0)
MCHC: 31.2 g/dL (ref 30.0–36.0)
MCV: 85.9 fL (ref 80.0–100.0)
Platelets: 186 10*3/uL (ref 150–400)
RBC: 4.03 MIL/uL (ref 3.87–5.11)
RDW: 15.5 % (ref 11.5–15.5)
WBC: 4.5 10*3/uL (ref 4.0–10.5)
nRBC: 0 % (ref 0.0–0.2)

## 2021-04-22 LAB — GLUCOSE, CAPILLARY
Glucose-Capillary: 183 mg/dL — ABNORMAL HIGH (ref 70–99)
Glucose-Capillary: 188 mg/dL — ABNORMAL HIGH (ref 70–99)
Glucose-Capillary: 264 mg/dL — ABNORMAL HIGH (ref 70–99)
Glucose-Capillary: 187 mg/dL — ABNORMAL HIGH (ref 70–99)

## 2021-04-22 MED ORDER — POTASSIUM CHLORIDE CRYS ER 20 MEQ PO TBCR
20.0000 meq | EXTENDED_RELEASE_TABLET | Freq: Once | ORAL | Status: AC
  Administered 2021-04-22: 20 meq via ORAL
  Filled 2021-04-22: qty 1

## 2021-04-22 NOTE — Discharge Instructions (Addendum)
Dear Mrs. Waldman,   You were admitted to [facility] for treatment of acute exacerbation of your chronic heart failure. You were given lasix with good urine output and improvement in your volume status.  Please ensure that you continue taking your demadex. Please ensure that you also continue taking your other heart failure medications. You will need to follow up with cardiac rehab as an outpatient. Please ensure that you follow up with your PCP. Please follow up with your cardiologist.

## 2021-04-22 NOTE — Discharge Summary (Addendum)
Name: Signe Tackitt MRN: [id number] DOB: 1966/05/07 55 y.o. PCP: [name], NP  Date of Admission: 04/17/2021  2:15 PM Date of Discharge:  Attending Physician: Lucious Groves, DO  Discharge Diagnosis: 1. Acute on chronic diastolic heart failure Persistent atrial fibrillation Type 2 DM OSA on CPAP HTN  Discharge Medications: Allergies as of 04/23/2021       Reactions   Acetaminophen Other (See Comments)   Seizure-like "fits" as a child   Caffeine    Tense, anxiety, increased urination   [place] [dapagliflozin] Other (See Comments)   Hallucinations, drop in blood sugar   Lisinopril Rash   Rash with lisinopril; but fosinopril is ok per patient        Medication List     STOP taking these medications    atorvastatin 40 MG tablet Commonly known as: LIPITOR   FeroSul 325 (65 FE) MG tablet Generic drug: ferrous sulfate   oxybutynin 5 MG tablet Commonly known as: DITROPAN   Potassium Chloride ER 20 MEQ Tbcr   ProAir HFA 108 (90 Base) MCG/ACT inhaler Generic drug: albuterol       TAKE these medications    Accu-Chek Aviva Plus test strip Generic drug: glucose blood USE AS DIRECTED TO TEST BLOOD SUGAR THREE TIMES DAILY   Accu-Chek Aviva Plus test strip Generic drug: glucose blood USE AS DIRECTED TO TEST BLOOD SUGAR ONCE DAILY   Accu-Chek Aviva Plus w/Device Kit USE AS DIRECTED TO CHECK BLOOD SUGAR ONCE DAILY   Accu-Chek Softclix Lancets lancets USE AS DIRECTED TO TEST BLOOD SUGAR THREE TIMES DAILY   amLODipine 10 MG tablet Commonly known as: NORVASC Take 1 tablet (10 mg total) by mouth daily.   Blood Pressure Monitor Devi Please provide patient with insurance approved blood pressure monitor I10.0   carvedilol 25 MG tablet Commonly known as: COREG Take 1 tablet (25 mg total) by mouth 2 (two) times daily with a meal.   empagliflozin 10 MG Tabs tablet Commonly known as: JARDIANCE Take 1 tablet (10 mg total) by mouth daily. Start taking on:  April 24, 2021   escitalopram 20 MG tablet Commonly known as: LEXAPRO TAKE 1 TABLET BY MOUTH EVERY MORNING What changed: how much to take   EYE ALLERGY RELIEF OP Place 1 drop into both eyes daily as needed (allergy).   glipiZIDE XL 10 MG 24 hr tablet Generic drug: glipiZIDE TAKE 1 TABLET (10 MG TOTAL) BY MOUTH EVERY EVENING. What changed:  how much to take how to take this when to take this   hydrALAZINE 25 MG tablet Commonly known as: APRESOLINE Take 1 tablet (25 mg total) by mouth every 8 (eight) hours.   hydrOXYzine 25 MG tablet Commonly known as: ATARAX/VISTARIL Take 1 tablet (25 mg total) by mouth 3 (three) times daily as needed for itching.   Incontinence Brief Large Misc Please provide patient with insurance approved incontinence supplies/briefs   losartan 25 MG tablet Commonly known as: COZAAR Take 1 tablet (25 mg total) by mouth daily. Start taking on: April 24, 2021   metFORMIN 1000 MG tablet Commonly known as: GLUCOPHAGE Take 1 tablet (1,000 mg total) by mouth 2 (two) times daily with a meal.   Misc. Devices Misc Please provide BiPAP machine with the following: Settings 22/18 cm H2O. Medium  size [employer] Full Face Mask Simplus mask and heated  humidification.   Misc. Devices Misc Please provide patient a Blood Pressure Monitor w/ insurance approval.   risperidone 4 MG tablet Commonly  known as: RISPERDAL Take 1 tablet (4 mg total) by mouth daily. Start taking on: April 24, 2021   rivaroxaban 20 MG Tabs tablet Commonly known as: Xarelto Take 1 tablet (20 mg total) by mouth daily with supper. What changed: how much to take   torsemide 20 MG tablet Commonly known as: DEMADEX TAKE 4 TABLETS (80 MG TOTAL) BY MOUTH 2 (TWO) TIMES DAILY. What changed:  how much to take how to take this when to take this   Trulicity 1.5 OZ/3.6UY Sopn Generic drug: Dulaglutide INJECT 1.5 MG INTO THE SKIN ONCE A WEEK.        Disposition and follow-up:    Ms.Shatonya Bal was discharged from [facility] in Stable condition.  At the hospital follow up visit please address:  1.  Acute on chronic diastolic heart failure Patient minimally diuresed overnight with net -760 cc.  Overall has experienced 2 kg drop in weight since admission.  Cardiology was consulted and we appreciate their recommendations. -Continue Coreg 25 mg twice daily, losartan 25 mg daily, hydralazine 25 mg 3 times daily, and Jardiance. - demadex 64m BID on discharge   Persistent atrial fibrillation Has had prior unsuccessful attempts at cardioversion in the past. Currently rate controlled. - Continue coreg 229mbid - Continue Xarelto 2027maily     Type II diabetes mellitus - continue outpatient regimen. On emmpaglifozin as well. - monitor for hypoglycemia    OSA on CPAP - Encouraged for CPAP nightly   Hypertension Continue losartan 66m42mily Continue amlodipine 10mg23mly Continue hydralazine 66mg 29m 2.  Labs / imaging needed at time of follow-up: cbc, bmp, mag, phos  3.  Pending labs/ test needing follow-up: none  Follow-up Appointments: Follow up with your primary care provider in 1 week - follow up with your cardiologist.  Hospital Course by problem list: 1. Acute on chronic diastolic heart failure - patient was admitted with acute on chronic diatolic heart failure.  While in the ED, troponins were mildly elevated and cardiology was consulted to help manage her heart failure. She was started on IV lasix with minimal urine output. Lasix dose increased to TID due to poor UOP with improvement in volume status. Oxygen status improved as well and she was deemed stable to discharge home on her home dose demadex.   Chronic problems were managed with home medications.  Discharge Exam:   BP 131/71 (BP Location: Right Wrist)   Pulse 75   Temp 98.2 F (36.8 C) (Oral)   Resp 20   Ht '5\' 7"'  (1.702 m)   Wt (!) 159.4 kg   SpO2 97%   BMI 55.05  kg/m  Discharge exam: Physical Exam Constitutional:      Appearance: She is obese.     Comments: lethargic  HENT:     Head: Normocephalic and atraumatic.  Cardiovascular:     Rate and Rhythm: Normal rate and regular rhythm.  Pulmonary:     Effort: Pulmonary effort is normal.     Breath sounds: Normal breath sounds.  Abdominal:     General: Bowel sounds are normal.     Palpations: Abdomen is soft.  Musculoskeletal:     Cervical back: Neck supple.     Right lower leg: Edema present.     Left lower leg: Edema present.  Skin:    General: Skin is warm and dry.  Neurological:     General: No focal deficit present.     Mental Status: She is oriented  to person, place, and time.  Psychiatric:        Mood and Affect: Mood normal.     Pertinent Labs, Studies, and Procedures:  CXR: Enlargement of the cardiac silhouette is unchanged. There is persistent pulmonary vascular congestion with bilateral interstitial and hazy airspace opacities, stable to slightly increased compared to the prior radiographs. No pleural effusion or pneumothorax is identified. No acute osseous abnormality is seen.  Vas [place] LE: RIGHT:  - There is no evidence of deep vein thrombosis in the lower extremity.  However, portions of this examination were limited- see technologist  comments above.  - No cystic structure found in the popliteal fossa.  LEFT:  - There is no evidence of deep vein thrombosis in the lower extremity.  However, portions of this examination were limited- see technologist  comments above.  - No cystic structure found in the popliteal fossa.  12 lead EKG - afib with septal and lateral infarcts  ECHO:  1. Left ventricular ejection fraction, by estimation, is 60 to 65%. The  left ventricle has normal function. The left ventricle has no regional  wall motion abnormalities. There is severe left ventricular hypertrophy.  Left ventricular diastolic function could not be evaluated.   2.  Right ventricular systolic function is normal. The right ventricular  size is normal. There is moderately elevated pulmonary artery systolic pressure.   3. Left atrial size was severely dilated.   4. Right atrial size was mildly dilated.   5. The mitral valve is normal in structure. Mild mitral valve  regurgitation. No evidence of mitral stenosis.   6. Tricuspid valve regurgitation is moderate to severe.   7. The aortic valve is tricuspid. Aortic valve regurgitation is trivial.  No aortic stenosis is present.   8. The inferior vena cava is dilated in size with >50% respiratory  variability, suggesting right atrial pressure of 8 mmHg.   Discharge Instructions: Discharge Instructions     AMB Referral to Phase II Cardiac Rehab   Complete by: As directed    Diagnosis: Heart Failure (see criteria below if ordering Phase II)   Heart Failure Type: Chronic Diastolic   After initial evaluation and assessments completed: Virtual Based Care may be provided alone or in conjunction with Phase 2 Cardiac Rehab based on patient barriers.: Yes   Call MD for:  difficulty breathing, headache or visual disturbances   Complete by: As directed    Call MD for:  extreme fatigue   Complete by: As directed    Call MD for:  hives   Complete by: As directed    Call MD for:  persistant dizziness or light-headedness   Complete by: As directed    Call MD for:  persistant nausea and vomiting   Complete by: As directed    Diet - low sodium heart healthy   Complete by: As directed    Increase activity slowly   Complete by: As directed      You were admitted to [facility] for treatment of acute exacerbation of your chronic heart failure. You were given lasix with good urine output and improvement in your volume status.  Please ensure that you continue taking your demadex. Please ensure that you also continue taking your other heart failure medications. You will need to follow up with cardiac  rehab as an outpatient. Please ensure that you follow up with your PCP. Please follow up with your cardiologist.  Signed: Delene Ruffini, MD 04/23/2021, 4:25 PM  Pager: '@[phone] @

## 2021-04-22 NOTE — Progress Notes (Addendum)
The patient has been seen in conjunction with Harlan Stains, NP. All aspects of care have been considered and discussed. The patient has been personally interviewed, examined, and all clinical data has been reviewed.  Planning to "dry the patient out" based upon clinical parameters.  There has been a slight bump in BUN and creatinine on today's labs. Plan 1 more day of IV diuretic therapy then back to oral therapy. Management of sleep apnea is vital.  The patient is unable to stay awake long enough to allow conversation and procurement of history. There are no "silver bullets" and overall prognosis seems poor.   Progress Note  Patient Name: [name] Date of Encounter: 04/22/2021  [facility] Cardiologist: [name], MD   Subjective   Remains very lethargic, but does open eyes to answer yes/no. Quickly back to sleep  Inpatient Medications    Scheduled Meds:  amLODipine  10 mg Oral Daily   carvedilol  25 mg Oral BID WC   empagliflozin  10 mg Oral Daily   furosemide  80 mg Intravenous Q8H   hydrALAZINE  25 mg Oral Q8H   insulin aspart  0-20 Units Subcutaneous TID WC   insulin aspart  0-5 Units Subcutaneous QHS   insulin aspart  8 Units Subcutaneous TID WC   insulin glargine  22 Units Subcutaneous QHS   losartan  25 mg Oral Daily   oxybutynin  5 mg Oral TID   risperidone  4 mg Oral Daily   rivaroxaban  20 mg Oral Q supper   Continuous Infusions:  PRN Meds: bisacodyl, hydrOXYzine, ibuprofen, lidocaine, nitroGLYCERIN, senna-docusate   Vital Signs    Vitals:   04/21/21 1227 04/21/21 2052 04/22/21 0403 04/22/21 0620  BP: (!) 143/94 131/73 101/62 120/80  Pulse: 88 82 88   Resp: '18 20 18   '$ Temp: 98.3 F (36.8 C) 98 F (36.7 C) 98.4 F (36.9 C)   TempSrc: Oral Oral Oral   SpO2:   97%   Weight:   (!) 161.1 kg   Height:        Intake/Output Summary (Last 24 hours) at 04/22/2021 0940 Last data filed at 04/22/2021 0400 Gross per 24 hour  Intake 240 ml   Output 1000 ml  Net -760 ml   Last 3 Weights 04/22/2021 04/21/2021 04/20/2021  Weight (lbs) 355 lb 1.6 oz 352 lb 8 oz 350 lb 14.4 oz  Weight (kg) 161.072 kg 159.893 kg 159.167 kg      Telemetry    Afib, rate controlled - Personally Reviewed  ECG    No new tracing  Physical Exam   GEN: Morbidly obese female, quite lethargic.  Neck: No JVD Cardiac: Irreg Irreg, no murmurs, rubs, or gallops.  Respiratory: Clear to auscultation bilaterally. GI: Soft, nontender, non-distended  MS: No edema; No deformity. Neuro:  sleepy, unable to assess Psych: lethargic  Labs    High Sensitivity Troponin:   Recent Labs  Lab 04/02/21 2348 04/17/21 1445 04/17/21 1645 04/18/21 0026 04/18/21 0226  TROPONINIHS 3 34* 193* 1,132* 901*      Chemistry Recent Labs  Lab 04/18/21 0226 04/19/21 0416 04/20/21 0254 04/21/21 0355 04/22/21 0256  NA 137   < > 137 134* 139  K 3.2*   < > 3.9 3.8 3.9  CL 101   < > 104 98 105  CO2 24   < > '26 26 27  '$ GLUCOSE 245*   < > 206* 306* 119*  BUN 13   < > 21*  27* 27*  CREATININE 0.92   < > 0.98 0.98 1.11*  CALCIUM 9.1   < > 9.2 9.0 8.8*  PROT 7.0  --   --   --  6.3*  ALBUMIN 3.3*  --   --   --  3.0*  AST 21  --   --   --  20  ALT 11  --   --   --  12  ALKPHOS 116  --   --   --  94  BILITOT 0.8  --   --   --  0.4  GFRNONAA >60   < > >60 >60 59*  ANIONGAP 12   < > '7 10 7   '$ < > = values in this interval not displayed.     Hematology Recent Labs  Lab 04/20/21 0254 04/21/21 0355 04/22/21 0256  WBC 5.2 5.9 4.5  RBC 4.03 4.26 4.03  HGB 10.7* 11.3* 10.8*  HCT 35.2* 36.4 34.6*  MCV 87.3 85.4 85.9  MCH 26.6 26.5 26.8  MCHC 30.4 31.0 31.2  RDW 15.4 15.4 15.5  PLT 170 184 186    BNP Recent Labs  Lab 04/17/21 1445  BNP 246.1*     DDimer No results for input(s): DDIMER in the last 168 hours.   Radiology    No results found.  Cardiac Studies   Echo: 04/18/2021   1. Left ventricular ejection fraction, by estimation, is 60 to 65%. The   left ventricle has normal function. The left ventricle has no regional  wall motion abnormalities. There is severe left ventricular hypertrophy.  Left ventricular diastolic function  could not be evaluated.   2. Right ventricular systolic function is normal. The right ventricular  size is normal. There is moderately elevated pulmonary artery systolic  pressure.   3. Left atrial size was severely dilated.   4. Right atrial size was mildly dilated.   5. The mitral valve is normal in structure. Mild mitral valve  regurgitation. No evidence of mitral stenosis.   6. Tricuspid valve regurgitation is moderate to severe.   7. The aortic valve is tricuspid. Aortic valve regurgitation is trivial.  No aortic stenosis is present.   8. The inferior vena cava is dilated in size with >50% respiratory  variability, suggesting right atrial pressure of 8 mmHg.  Patient Profile     55 y.o. 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   Assessment & Plan    Chest pain with elevated troponin: hsTn peaked at 1132. Cath 2 years ago with normal coronary arteries. Felt to be demand ischemia in the setting of CHF.   Acute on Chronic diastolic CHF: has been diuresing with IV lasix.  -- net - 5.8L, weight do not appear accurate? -- mild increase in Cr 0.98>>1.1. Tough to assess her volume status given her size. -- may benefit from a RHC? to better determine volume. Review with MD -- on coreg '25mg'$  BID, losartan '25mg'$  daily, hydralazine '25mg'$  TID, along with jardiance (reports intolerance with farxiga in the past with hallucinations/low glucose?)  HTN: stable with coreg, losartan, amlodipine, hydralazine   Persistent afb: rate control management, on coreg and xarelto  OSA: seems quite severe, notes indicate she has been refusing Cpap during admission.  Suspect this is contributing to her lethargy.   For questions or updates, please contact [place] Please consult  [email or web address] for contact info under        Signed, Reino Bellis, NP  04/22/2021, 9:40 AM

## 2021-04-22 NOTE — Progress Notes (Signed)
HD#4 SUBJECTIVE:  Patient Summary: [name] is a 55 y.o. with a pertinent PMH of HFpEF, hypertension, atrial fibrillation on Xarelto, obesity, who presented with shortness of breath and chest pain and admitted for acute on chronic heart failure.   Overnight Events: No events overnight  Interim History: Patient is laying flat in bed and does not appear to be in any acute distress.  She does appear sleepy and does not participate with the interview.  Otherwise she denies any acute complaints.  OBJECTIVE:  Vital Signs: Vitals:   04/21/21 2052 04/22/21 0403 04/22/21 0620 04/22/21 1126  BP: 131/73 101/62 120/80 (!) 151/99  Pulse: 82 88  88  Resp: '20 18  18  '$ Temp: 98 F (36.7 C) 98.4 F (36.9 C)  98.1 F (36.7 C)  TempSrc: Oral Oral  Oral  SpO2:  97%  93%  Weight:  (!) 161.1 kg    Height:       Supplemental O2: Room Air SpO2: 93 % O2 Flow Rate (L/min): 2 L/min  Filed Weights   04/20/21 0400 04/21/21 0623 04/22/21 0403  Weight: (!) 159.2 kg (!) 159.9 kg (!) 161.1 kg     Intake/Output Summary (Last 24 hours) at 04/22/2021 1653 Last data filed at 04/22/2021 1317 Gross per 24 hour  Intake 480 ml  Output 1000 ml  Net -520 ml   Net IO Since Admission: -5,640 mL [04/22/21 1653]  Physical Exam: Physical Exam Constitutional:      Appearance: She is obese.     Comments: Appears sleepy  HENT:     Head: Normocephalic and atraumatic.  Eyes:     Extraocular Movements: Extraocular movements intact.  Cardiovascular:     Rate and Rhythm: Normal rate and regular rhythm.     Pulses: Normal pulses.     Heart sounds: Normal heart sounds.  Pulmonary:     Effort: Pulmonary effort is normal.     Breath sounds: Normal breath sounds. No wheezing or rales.  Abdominal:     General: There is no distension.     Palpations: Abdomen is soft.     Tenderness: There is no abdominal tenderness.  Musculoskeletal:        General: Swelling (Trace lower extremity pitting edema) present.  Normal range of motion.     Cervical back: Normal range of motion.  Skin:    General: Skin is warm and dry.  Neurological:     General: No focal deficit present.     Mental Status: She is alert.    Patient Lines/Drains/Airways Status     Active Line/Drains/Airways     Name Placement date Placement time Site Days   Peripheral IV 04/17/21 18 G Left Antecubital 04/17/21  1432  Antecubital  5            Pertinent Labs: CBC Latest Ref Rng & Units 04/22/2021 04/21/2021 04/20/2021  WBC 4.0 - 10.5 K/uL 4.5 5.9 5.2  Hemoglobin 12.0 - 15.0 g/dL 10.8(L) 11.3(L) 10.7(L)  Hematocrit 36.0 - 46.0 % 34.6(L) 36.4 35.2(L)  Platelets 150 - 400 K/uL 186 184 170    CMP Latest Ref Rng & Units 04/22/2021 04/21/2021 04/20/2021  Glucose 70 - 99 mg/dL 119(H) 306(H) 206(H)  BUN 6 - 20 mg/dL 27(H) 27(H) 21(H)  Creatinine 0.44 - 1.00 mg/dL 1.11(H) 0.98 0.98  Sodium 135 - 145 mmol/L 139 134(L) 137  Potassium 3.5 - 5.1 mmol/L 3.9 3.8 3.9  Chloride 98 - 111 mmol/L 105 98 104  CO2 22 - 32  mmol/L '27 26 26  '$ Calcium 8.9 - 10.3 mg/dL 8.8(L) 9.0 9.2  Total Protein 6.5 - 8.1 g/dL 6.3(L) - -  Total Bilirubin 0.3 - 1.2 mg/dL 0.4 - -  Alkaline Phos 38 - 126 U/L 94 - -  AST 15 - 41 U/L 20 - -  ALT 0 - 44 U/L 12 - -    Recent Labs    04/22/21 0728 04/22/21 1131 04/22/21 1612  GLUCAP 183* 188* 264*     Pertinent Imaging: No results found.  ASSESSMENT/PLAN:  Assessment: Principal Problem:   Acute on chronic heart failure (HCC) Active Problems:   Class 3 severe obesity due to excess calories with serious comorbidity and body mass index (BMI) of 60.0 to 69.9 in adult [facility])   Acute on chronic congestive heart failure ([place])   Plan: Acute on chronic diastolic heart failure Patient minimally diuresed overnight with net -760 cc.  Overall has experienced 2 kg drop in weight since admission.  Cardiology was consulted and we appreciate their recommendations. -Continue diuresis per cardiology.  Has been on IV  Lasix 80 mg 3 times daily with minimal output -Continue Coreg 25 mg twice daily, losartan 25 mg daily, hydralazine 25 mg 3 times daily, and Jardiance. -Strict ins and outs, daily weights -We will monitor electrolytes daily -Consider right heart cath per cardiology for volume assessment   Persistent atrial fibrillation Has had prior unsuccessful attempts at cardioversion in the past. Currently rate controlled. - Continue coreg '25mg'$  bid - Continue Xarelto '20mg'$  daily     Type II diabetes mellitus -Continue sliding scale insulin -Continue NovoLog 8 units with meals -Continue Lantus 22 units nightly -Continue Jardiance -CBG monitoring   OSA on CPAP - Encouraged for CPAP nightly   Hypertension Continue losartan '25mg'$  daily Continue amlodipine '10mg'$  daily Continue hydralazine '25mg'$  q8h  Best Practice: Diet: Cardiac diet IVF: Fluids: none, Rate: None VTE:  Code: Full AB: none Therapy Recs: Pending, DME: none Family Contact: n/a DISPO: Anticipated discharge in 1-3 days to Skilled nursing facility pending Medical stability.  Signature: Lawerance Cruel, D.O.  Internal Medicine Resident, PGY-3 [facility]  Pager: [phone] 4:53 PM, 04/22/2021   Please contact the on call pager after 5 pm and on weekends at [phone].

## 2021-04-23 ENCOUNTER — Other Ambulatory Visit: Payer: Self-pay

## 2021-04-23 DIAGNOSIS — I5033 Acute on chronic diastolic (congestive) heart failure: Secondary | ICD-10-CM | POA: Diagnosis not present

## 2021-04-23 DIAGNOSIS — G4733 Obstructive sleep apnea (adult) (pediatric): Secondary | ICD-10-CM

## 2021-04-23 DIAGNOSIS — E119 Type 2 diabetes mellitus without complications: Secondary | ICD-10-CM | POA: Diagnosis not present

## 2021-04-23 DIAGNOSIS — I2729 Other secondary pulmonary hypertension: Secondary | ICD-10-CM | POA: Diagnosis not present

## 2021-04-23 DIAGNOSIS — Q245 Malformation of coronary vessels: Secondary | ICD-10-CM | POA: Diagnosis not present

## 2021-04-23 DIAGNOSIS — Z20822 Contact with and (suspected) exposure to covid-19: Secondary | ICD-10-CM | POA: Diagnosis not present

## 2021-04-23 DIAGNOSIS — Z6841 Body Mass Index (BMI) 40.0 and over, adult: Secondary | ICD-10-CM | POA: Diagnosis not present

## 2021-04-23 DIAGNOSIS — I110 Hypertensive heart disease with heart failure: Secondary | ICD-10-CM | POA: Diagnosis not present

## 2021-04-23 DIAGNOSIS — I5043 Acute on chronic combined systolic (congestive) and diastolic (congestive) heart failure: Secondary | ICD-10-CM | POA: Diagnosis not present

## 2021-04-23 DIAGNOSIS — I248 Other forms of acute ischemic heart disease: Secondary | ICD-10-CM | POA: Diagnosis not present

## 2021-04-23 DIAGNOSIS — I071 Rheumatic tricuspid insufficiency: Secondary | ICD-10-CM | POA: Diagnosis not present

## 2021-04-23 DIAGNOSIS — F319 Bipolar disorder, unspecified: Secondary | ICD-10-CM | POA: Diagnosis not present

## 2021-04-23 DIAGNOSIS — I509 Heart failure, unspecified: Secondary | ICD-10-CM | POA: Diagnosis not present

## 2021-04-23 DIAGNOSIS — I4819 Other persistent atrial fibrillation: Secondary | ICD-10-CM | POA: Diagnosis not present

## 2021-04-23 DIAGNOSIS — E669 Obesity, unspecified: Secondary | ICD-10-CM | POA: Diagnosis not present

## 2021-04-23 LAB — BASIC METABOLIC PANEL
Anion gap: 8 (ref 5–15)
BUN: 28 mg/dL — ABNORMAL HIGH (ref 6–20)
CO2: 27 mmol/L (ref 22–32)
Calcium: 8.7 mg/dL — ABNORMAL LOW (ref 8.9–10.3)
Chloride: 101 mmol/L (ref 98–111)
Creatinine, Ser: 1.1 mg/dL — ABNORMAL HIGH (ref 0.44–1.00)
GFR, Estimated: >60 mL/min (ref >60)
Glucose, Bld: 138 mg/dL — ABNORMAL HIGH (ref 70–99)
Potassium: 3.2 mmol/L — ABNORMAL LOW (ref 3.5–5.1)
Sodium: 136 mmol/L (ref 135–145)

## 2021-04-23 LAB — GLUCOSE, CAPILLARY
Glucose-Capillary: 242 mg/dL — ABNORMAL HIGH (ref 70–99)
Glucose-Capillary: 160 mg/dL — ABNORMAL HIGH (ref 70–99)
Glucose-Capillary: 132 mg/dL — ABNORMAL HIGH (ref 70–99)

## 2021-04-23 LAB — MAGNESIUM: Magnesium: 2.2 mg/dL (ref 1.7–2.4)

## 2021-04-23 MED ORDER — EMPAGLIFLOZIN 10 MG PO TABS
10.0000 mg | ORAL_TABLET | Freq: Every day | ORAL | 0 refills | Status: DC
  Filled 2021-04-23 – 2021-04-24 (×2): qty 30, 30d supply, fill #0

## 2021-04-23 MED ORDER — TORSEMIDE 20 MG PO TABS
ORAL_TABLET | ORAL | 11 refills | Status: DC
  Filled 2021-04-23: qty 240, fill #0
  Filled 2021-06-21: qty 240, 30d supply, fill #0
  Filled 2021-07-30: qty 240, 30d supply, fill #1

## 2021-04-23 MED ORDER — HYDROXYZINE HCL 25 MG PO TABS
25.0000 mg | ORAL_TABLET | Freq: Three times a day (TID) | ORAL | 0 refills | Status: DC | PRN
  Filled 2021-04-23: qty 30, 10d supply, fill #0

## 2021-04-23 MED ORDER — RIVAROXABAN 20 MG PO TABS
20.0000 mg | ORAL_TABLET | Freq: Every day | ORAL | 0 refills | Status: DC
  Filled 2021-04-23: qty 30, 30d supply, fill #0

## 2021-04-23 MED ORDER — LOSARTAN POTASSIUM 25 MG PO TABS
25.0000 mg | ORAL_TABLET | Freq: Every day | ORAL | 0 refills | Status: DC
  Filled 2021-04-23: qty 30, 30d supply, fill #0

## 2021-04-23 MED ORDER — HYDRALAZINE HCL 25 MG PO TABS
25.0000 mg | ORAL_TABLET | Freq: Three times a day (TID) | ORAL | 0 refills | Status: DC
  Filled 2021-04-23: qty 90, 30d supply, fill #0

## 2021-04-23 MED ORDER — CARVEDILOL 25 MG PO TABS
25.0000 mg | ORAL_TABLET | Freq: Two times a day (BID) | ORAL | 0 refills | Status: DC
  Filled 2021-04-23: qty 60, 30d supply, fill #0

## 2021-04-23 MED ORDER — RISPERIDONE 4 MG PO TABS
4.0000 mg | ORAL_TABLET | Freq: Every day | ORAL | 0 refills | Status: DC
  Filled 2021-04-23: qty 30, 30d supply, fill #0

## 2021-04-23 MED ORDER — POTASSIUM CHLORIDE 20 MEQ PO PACK
40.0000 meq | PACK | Freq: Two times a day (BID) | ORAL | Status: DC
  Administered 2021-04-23: 40 meq via ORAL
  Filled 2021-04-23: qty 2

## 2021-04-23 MED FILL — Dulaglutide Soln Auto-injector 1.5 MG/0.5ML: SUBCUTANEOUS | 28 days supply | Qty: 2 | Fill #0 | Status: AC

## 2021-04-23 NOTE — Progress Notes (Signed)
Patient refused CPAP at this time.

## 2021-04-23 NOTE — Progress Notes (Signed)
Received page from RN about chest pain. Evaluated the patient at bedside. She states that she was eating some food around 4:30 when she started having a stabbing, intermittent chest pain/pressure. The patient says that it worsens when she breathes in. The pain right now is "tolerable" and "not like the pain I had when I came in."  CV: Tachycardic, irregular rhythm, no m/r/g. No TTP to chest wall. EKG: no new ischemic changes, overall unchanged from prior on 07/22  Assessment/Plan: Chest pain is reproducible on exam. EKG is unchanged from prior. Likely costochondritis vs GERD. Patient given lidocaine patch today. Stable for discharge.

## 2021-04-23 NOTE — Progress Notes (Signed)
Patient complained of CP. 8 out of 10.  4 L O2 [place] applied EKG obtained.  CP resolved.  Dr. Irish Lack. Rehman notified. MD Came to assess patient.  Lidoderm patch applied per Dr Lorin Glass, per MD ok to continue with discharge.

## 2021-04-23 NOTE — TOC Initial Note (Signed)
Transition of Care [facility]) - Initial/Assessment Note    Patient Details  Name: [name] MRN: [id number] Date of Birth: [birth year]  Transition of Care [facility]) CM/SW Contact:    Bethena Roys, RN Phone Number: 04/23/2021, 4:40 PM  Clinical Narrative:  Risk for readmission assessment completed. Case Manager received a consult regarding that the patient had a nurse that was visiting every 2-3 days. Patient states she use to have an aide service via Medicaid; however, the service has since stopped. Patient states she is doing well and will not need services at this time.Patient states she has transportation home. No further needs from Case Manager.                Expected Discharge Plan: Home/Self Care Barriers to Discharge: No Barriers Identified   Patient Goals and CMS Choice Patient states their goals for this hospitalization and ongoing recovery are:: to return home.   Choice offered to / list presented to : NA  Expected Discharge Plan and Services Expected Discharge Plan: Home/Self Care In-house Referral: NA Discharge Planning Services: CM Consult Post Acute Care Choice: NA Living arrangements for the past 2 months: Apartment Expected Discharge Date: 04/23/21               DME Arranged: N/A DME Agency: NA       HH Arranged: NA          Prior Living Arrangements/Services Living arrangements for the past 2 months: Apartment Lives with:: Self Patient language and need for interpreter reviewed:: Yes Do you feel safe going back to the place where you live?: Yes      Need for Family Participation in Patient Care: No (Comment) Care giver support system in place?: No (comment)   Criminal Activity/Legal Involvement Pertinent to Current Situation/Hospitalization: No - Comment as needed  Activities of Daily Living Home Assistive Devices/Equipment: CPAP, Eyeglasses ADL Screening (condition at time of admission) Patient's cognitive ability adequate to safely  complete daily activities?: Yes Is the patient deaf or have difficulty hearing?: No Does the patient have difficulty seeing, even when wearing glasses/contacts?: No Does the patient have difficulty concentrating, remembering, or making decisions?: No Patient able to express need for assistance with ADLs?: Yes Does the patient have difficulty dressing or bathing?: No Independently performs ADLs?: Yes (appropriate for developmental age) Does the patient have difficulty walking or climbing stairs?: No Weakness of Legs: None Weakness of Arms/Hands: None  Permission Sought/Granted Permission sought to share information with : Family Supports, Customer service manager, Case Manager                Emotional Assessment Appearance:: Appears stated age Attitude/Demeanor/Rapport: Engaged Affect (typically observed): Appropriate Orientation: : Oriented to Situation, Oriented to  Time, Oriented to Place, Oriented to Self Alcohol / Substance Use: Not Applicable Psych Involvement: No (comment)  Admission diagnosis:  Troponin level elevated [R77.8] Acute on chronic heart failure (HCC) [I50.9] Acute on chronic congestive heart failure ([place]) [I50.9] Chest pain, unspecified type [R07.9] Acute on chronic congestive heart failure, unspecified heart failure type [facility]) [I50.9] Patient Active Problem List   Diagnosis Date Noted   Acute on chronic congestive heart failure ([place]) 04/18/2021   Acute on chronic heart failure ([place]) 04/17/2021   Elevated troponin    Obesity hypoventilation syndrome ([place]) 12/15/2019   Depression 12/15/2019   ILD (interstitial lung disease) ([place]) 05/28/2019   Exertional angina ([place]) 05/28/2019   AKI (acute kidney injury) ([place])    Vitamin B12 deficiency 07/20/2018  Hematochezia 07/14/2018   Acute posthemorrhagic anemia 07/14/2018   GIB (gastrointestinal bleeding) 07/14/2018   Chronic anticoagulation 07/14/2018   Persistent atrial fibrillation    Uncontrolled  type 2 diabetes mellitus with hyperglycemia (HCC)    Hypokalemia    Cor pulmonale (chronic) (HCC)    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   Pyrexia    Dyspnea 11/13/2015   Mediastinal adenopathy 11/13/2015   Abnormal CT scan, chest 11/11/2015   Chest pain 11/11/2015   Essential hypertension 03/07/2015   Depression (emotion) 03/07/2015   Noninfectious gastroenteritis and colitis 01/02/2015   Sinusitis, chronic 01/02/2015   Midline low back pain without sciatica 09/10/2014   Bipolar 1 disorder, mixed, moderate ([place]) 07/02/2014   Stress incontinence 07/02/2014   Mania ([place]) 12/10/2013   Speech abnormality 12/08/2013   SVT (supraventricular tachycardia) ([place]) 12/06/2013   Benign essential HTN 11/28/2013   H/O non-insulin dependent diabetes mellitus 11/28/2013   Pulmonary HTN, moderate to severe 11/03/2013   Acute on chronic diastolic congestive heart failure ([place]) 11/02/2013   Hypertensive heart disease 10/18/2013   Non-insulin dependent type 2 diabetes mellitus ([place]) 10/18/2013   Chronic diastolic heart failure ([place]) 07/23/2011   OSA (obstructive sleep apnea)- non compliant with C-pap 03/06/2011   Class 3 severe obesity due to excess calories with serious comorbidity and body mass index (BMI) of 60.0 to 69.9 in adult [facility]) 02/19/2011   Non-ST elevation (NSTEMI) myocardial infarction ([place]) 04/29/2009   Bipolar disorder    PCP:  [name], NP Pharmacy:   [facility] [place] [address] [place] [address] Phone: [phone] Fax: [phone]   Readmission Risk Interventions Readmission Risk Prevention Plan 04/23/2021  Transportation Screening Complete  HRI or Home Care Consult Complete  Social Work Consult for [place] Planning/Counseling Complete  Palliative Care Screening Not Applicable  Medication Review Press photographer) Complete  Some recent data might be hidden

## 2021-04-23 NOTE — Progress Notes (Addendum)
The patient has been seen in conjunction with Harlan Stains, NP. All aspects of care have been considered and discussed. The patient has been personally interviewed, examined, and all clinical data has been reviewed.  All data reviewed. Nice diuretic response. Agree with decision to continue IV diuresis for follow kidney function closely.   Progress Note  Patient Name: [name] Date of Encounter: 04/23/2021  [facility] Cardiologist: [name], MD   Subjective   Remains lethargic, but does open eyes to questions. Does not participate in questioning though.  Inpatient Medications    Scheduled Meds:  amLODipine  10 mg Oral Daily   carvedilol  25 mg Oral BID WC   empagliflozin  10 mg Oral Daily   furosemide  80 mg Intravenous Q8H   hydrALAZINE  25 mg Oral Q8H   insulin aspart  0-20 Units Subcutaneous TID WC   insulin aspart  0-5 Units Subcutaneous QHS   insulin aspart  8 Units Subcutaneous TID WC   insulin glargine  22 Units Subcutaneous QHS   losartan  25 mg Oral Daily   oxybutynin  5 mg Oral TID   potassium chloride  40 mEq Oral BID   risperidone  4 mg Oral Daily   rivaroxaban  20 mg Oral Q supper   Continuous Infusions:  PRN Meds: bisacodyl, hydrOXYzine, ibuprofen, lidocaine, nitroGLYCERIN, senna-docusate   Vital Signs    Vitals:   04/22/21 0620 04/22/21 1126 04/22/21 1938 04/23/21 0500  BP: 120/80 (!) 151/99 120/80   Pulse:  88 88   Resp:  18 18   Temp:  98.1 F (36.7 C) 98.9 F (37.2 C)   TempSrc:  Oral Oral   SpO2:  93% 94%   Weight:    (!) 159.4 kg  Height:        Intake/Output Summary (Last 24 hours) at 04/23/2021 0923 Last data filed at 04/23/2021 0600 Gross per 24 hour  Intake 1545 ml  Output 5100 ml  Net -3555 ml   Last 3 Weights 04/23/2021 04/22/2021 04/21/2021  Weight (lbs) 351 lb 8 oz 355 lb 1.6 oz 352 lb 8 oz  Weight (kg) 159.439 kg 161.072 kg 159.893 kg      Telemetry    Afib, rate controlled 80s, PVCs, couplets-  Personally Reviewed  ECG    No new tracing  Physical Exam   GEN: Morbidly obese   Neck: No JVD Cardiac: Irreg Irreg, no murmurs, rubs, or gallops.  Respiratory: Clear to auscultation bilaterally. GI: Soft, nontender, non-distended  MS: No edema; No deformity. Neuro:  sleepy unable to assess Psych: lethargic  Labs    High Sensitivity Troponin:   Recent Labs  Lab 04/02/21 2348 04/17/21 1445 04/17/21 1645 04/18/21 0026 04/18/21 0226  TROPONINIHS 3 34* 193* 1,132* 901*      Chemistry Recent Labs  Lab 04/18/21 0226 04/19/21 0416 04/21/21 0355 04/22/21 0256 04/23/21 0115  NA 137   < > 134* 139 136  K 3.2*   < > 3.8 3.9 3.2*  CL 101   < > 98 105 101  CO2 24   < > '26 27 27  '$ GLUCOSE 245*   < > 306* 119* 138*  BUN 13   < > 27* 27* 28*  CREATININE 0.92   < > 0.98 1.11* 1.10*  CALCIUM 9.1   < > 9.0 8.8* 8.7*  PROT 7.0  --   --  6.3*  --   ALBUMIN 3.3*  --   --  3.0*  --  AST 21  --   --  20  --   ALT 11  --   --  12  --   ALKPHOS 116  --   --  94  --   BILITOT 0.8  --   --  0.4  --   GFRNONAA >60   < > >60 59* >60  ANIONGAP 12   < > '10 7 8   '$ < > = values in this interval not displayed.     Hematology Recent Labs  Lab 04/20/21 0254 04/21/21 0355 04/22/21 0256  WBC 5.2 5.9 4.5  RBC 4.03 4.26 4.03  HGB 10.7* 11.3* 10.8*  HCT 35.2* 36.4 34.6*  MCV 87.3 85.4 85.9  MCH 26.6 26.5 26.8  MCHC 30.4 31.0 31.2  RDW 15.4 15.4 15.5  PLT 170 184 186    BNP Recent Labs  Lab 04/17/21 1445  BNP 246.1*     DDimer No results for input(s): DDIMER in the last 168 hours.   Radiology    No results found.  Cardiac Studies   Echo: 04/18/2021   1. Left ventricular ejection fraction, by estimation, is 60 to 65%. The  left ventricle has normal function. The left ventricle has no regional  wall motion abnormalities. There is severe left ventricular hypertrophy.  Left ventricular diastolic function  could not be evaluated.   2. Right ventricular systolic function  is normal. The right ventricular  size is normal. There is moderately elevated pulmonary artery systolic  pressure.   3. Left atrial size was severely dilated.   4. Right atrial size was mildly dilated.   5. The mitral valve is normal in structure. Mild mitral valve  regurgitation. No evidence of mitral stenosis.   6. Tricuspid valve regurgitation is moderate to severe.   7. The aortic valve is tricuspid. Aortic valve regurgitation is trivial.  No aortic stenosis is present.   8. The inferior vena cava is dilated in size with >50% respiratory  variability, suggesting right atrial pressure of 8 mmHg.  Patient Profile     55 y.o. 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   Assessment & Plan    Chest pain with elevated troponin: hsTn peaked at 1132. Cath 2 years ago with normal coronary arteries. Felt to be demand ischemia in the setting of CHF.   Acute on Chronic diastolic CHF: has been diuresing with IV lasix. -- significant UOP yesterday of 5.1L, now net - 9.4L, weight now seems to be trending down. -- mild increase in Cr 0.98>>1.1 but stable this morning. Tough to assess her volume status given her size but suspect still overloaded. Holding on RHC given good response to lasix. Continue diuresis. -- on coreg '25mg'$  BID, losartan '25mg'$  daily, hydralazine '25mg'$  TID, along with jardiance (reports intolerance with farxiga in the past with hallucinations/low glucose?)   HTN: stable with coreg, losartan, amlodipine, hydralazine   Persistent afb: rate control management, on coreg and xarelto   OSA: seems quite severe, notes indicate she has been refusing Cpap during admission.  -- Suspect this is contributing to her lethargy.    For questions or updates, please contact [place] Please consult [email or web address] for contact info under         Signed, Reino Bellis, NP  04/23/2021, 9:23 AM

## 2021-04-24 ENCOUNTER — Telehealth: Payer: Self-pay

## 2021-04-24 ENCOUNTER — Other Ambulatory Visit: Payer: Self-pay

## 2021-04-24 NOTE — Telephone Encounter (Signed)
Transition Care Management Unsuccessful Follow-up Telephone Call  Date of discharge and from where:  04/23/2021-[place]   Attempts:  1st Attempt  Reason for unsuccessful TCM follow-up call:  Left voice message

## 2021-04-24 NOTE — Telephone Encounter (Signed)
Jardiance PA approved until 04/24/22

## 2021-04-24 NOTE — Telephone Encounter (Signed)
Transition Care Management Unsuccessful Follow-up Telephone Call   Date of discharge and from where:  04/23/2021-[facility]   Attempts:  1st Attempt   Reason for unsuccessful TCM follow-up call:  [phone] Left voice message for pt to call back .  Pt needs to schedule HFU appt  with PCP

## 2021-04-25 ENCOUNTER — Telehealth: Payer: Self-pay

## 2021-04-25 NOTE — Telephone Encounter (Signed)
Transition Care Management Unsuccessful Follow-up Telephone Call   Date of discharge and from where:  04/23/2021-[facility]   Attempts:  2 Attempt   Reason for unsuccessful TCM follow-up call:  [phone] Left voice message for pt to call back . Pt needs to schedule HFU appt  with PCP

## 2021-04-28 ENCOUNTER — Telehealth: Payer: Self-pay

## 2021-04-28 DIAGNOSIS — J849 Interstitial pulmonary disease, unspecified: Secondary | ICD-10-CM | POA: Diagnosis not present

## 2021-04-28 DIAGNOSIS — I2781 Cor pulmonale (chronic): Secondary | ICD-10-CM | POA: Diagnosis not present

## 2021-04-28 NOTE — Telephone Encounter (Signed)
Transition Care Management Unsuccessful Follow-up Telephone Call   Date of discharge and from where:  04/23/2021-[facility]   Attempts:  3 rd Attempt    Reason for unsuccessful TCM follow-up call:  Called pt at [phone] Left voice message for pt to call back . Pt needs to schedule HFU appt  with PCP

## 2021-05-15 ENCOUNTER — Other Ambulatory Visit: Payer: Self-pay

## 2021-05-15 ENCOUNTER — Other Ambulatory Visit: Payer: Self-pay | Admitting: Family Medicine

## 2021-05-15 MED ORDER — METFORMIN HCL 1000 MG PO TABS
1000.0000 mg | ORAL_TABLET | Freq: Two times a day (BID) | ORAL | 0 refills | Status: DC
  Filled 2021-05-15 – 2021-05-22 (×2): qty 60, 30d supply, fill #0

## 2021-05-15 NOTE — Telephone Encounter (Signed)
Requested medication (s) are due for refill today: yes   Requested medication (s) are on the active medication list: yes  Last refill:  04/14/2021  Future visit scheduled: yes   Notes to clinic:  Patient has appt scheduled for 06/03/2021   Requested Prescriptions  Pending Prescriptions Disp Refills   metFORMIN (GLUCOPHAGE) 1000 MG tablet 60 tablet 0    Sig: Take 1 tablet (1,000 mg total) by mouth 2 (two) times daily with a meal.     Endocrinology:  Diabetes - Biguanides Failed - 05/15/2021  2:32 PM      Failed - Cr in normal range and within 360 days    Creat  Date Value Ref Range Status  08/06/2016 0.81 0.50 - 1.10 mg/dL Final   Creatinine, Ser  Date Value Ref Range Status  04/23/2021 1.10 (H) 0.44 - 1.00 mg/dL Final   Creatinine, Urine  Date Value Ref Range Status  08/06/2018 197.25 mg/dL Final    Comment:    Performed at [facility], [place] [address] [place], [place] [address]          Failed - HBA1C is between 0 and 7.9 and within 180 days    HbA1c, POC (controlled diabetic range)  Date Value Ref Range Status  09/16/2018 7.1 (A) 0.0 - 7.0 % Final   Hgb A1c MFr Bld  Date Value Ref Range Status  04/17/2021 9.7 (H) 4.8 - 5.6 % Final    Comment:    (NOTE) Pre diabetes:          5.7%-6.4%  Diabetes:              >6.4%  Glycemic control for   <7.0% adults with diabetes           Passed - eGFR in normal range and within 360 days    GFR, Est African American  Date Value Ref Range Status  08/06/2016 >89 >=60 mL/min Final   GFR calc Af Amer  Date Value Ref Range Status  09/17/2020 77 >59 mL/min/1.73 Final    Comment:    **In accordance with recommendations from the NKF-ASN Task force,**   [facility] is in the process of updating its eGFR calculation to the   2021 CKD-EPI creatinine equation that estimates kidney function   without a race variable.    GFR, Est Non African American  Date Value Ref Range Status  08/06/2016 86 >=60 mL/min  Final   GFR, Estimated  Date Value Ref Range Status  04/23/2021 >60 >60 mL/min Final    Comment:    (NOTE) Calculated using the CKD-EPI Creatinine Equation (2021)    GFR  Date Value Ref Range Status  12/25/2013 117.88 >60.00 mL/min Final   eGFR  Date Value Ref Range Status  12/26/2020 65 >59 mL/min/1.73 Final          Passed - Valid encounter within last 6 months    Recent Outpatient Visits           4 months ago Thumb pain, right   [place], [place], [place]   6 months ago Uncontrolled type 2 diabetes mellitus with hyperglycemia [facility])   [place], Annie Main L, RPH-CPP   6 months ago Uncontrolled type 2 diabetes mellitus with hyperglycemia [facility])   [place], Annie Main L, RPH-CPP   7 months ago Uncontrolled type 2 diabetes mellitus with hyperglycemia ([place])   [place], NP   9 months ago Shortness of breath at rest   [employer], Vernia Buff, NP       Future Appointments             In 2 weeks [name], NP [place]

## 2021-05-19 ENCOUNTER — Other Ambulatory Visit: Payer: Self-pay

## 2021-05-19 DIAGNOSIS — N3946 Mixed incontinence: Secondary | ICD-10-CM | POA: Diagnosis not present

## 2021-05-19 MED ORDER — SOLIFENACIN SUCCINATE 10 MG PO TABS
ORAL_TABLET | ORAL | 11 refills | Status: DC
  Filled 2021-05-19: qty 30, 30d supply, fill #0
  Filled 2021-07-02 – 2021-07-09 (×2): qty 30, 30d supply, fill #1
  Filled 2021-07-30: qty 30, 30d supply, fill #2

## 2021-05-20 ENCOUNTER — Other Ambulatory Visit: Payer: Self-pay

## 2021-05-22 ENCOUNTER — Other Ambulatory Visit: Payer: Self-pay

## 2021-05-23 ENCOUNTER — Other Ambulatory Visit: Payer: Self-pay

## 2021-05-25 MED FILL — Dulaglutide Soln Auto-injector 1.5 MG/0.5ML: SUBCUTANEOUS | 28 days supply | Qty: 2 | Fill #1 | Status: AC

## 2021-05-26 ENCOUNTER — Other Ambulatory Visit: Payer: Self-pay

## 2021-05-30 ENCOUNTER — Other Ambulatory Visit: Payer: Self-pay

## 2021-05-30 ENCOUNTER — Other Ambulatory Visit: Payer: Self-pay | Admitting: Nurse Practitioner

## 2021-06-03 ENCOUNTER — Inpatient Hospital Stay: Payer: Medicaid Other | Admitting: Nurse Practitioner

## 2021-06-03 NOTE — Telephone Encounter (Signed)
Requested medication (s) are due for refill today: -  Requested medication (s) are on the active medication list: expired  Last refill:  04/23/21 at hospital discharge  Future visit scheduled: yes today  Notes to clinic:  both meds have expired- last prescribed at hospital discharge   Requested Prescriptions  Pending Prescriptions Disp Refills   losartan (COZAAR) 25 MG tablet 30 tablet 0    Sig: Take 1 tablet (25 mg total) by mouth daily.     Cardiovascular:  Angiotensin Receptor Blockers Failed - 05/30/2021 12:18 PM      Failed - Cr in normal range and within 180 days    Creat  Date Value Ref Range Status  08/06/2016 0.81 0.50 - 1.10 mg/dL Final   Creatinine, Ser  Date Value Ref Range Status  04/23/2021 1.10 (H) 0.44 - 1.00 mg/dL Final   Creatinine, Urine  Date Value Ref Range Status  08/06/2018 197.25 mg/dL Final    Comment:    Performed at [facility], [place] [address] [place], [place] [address]          Failed - K in normal range and within 180 days    Potassium  Date Value Ref Range Status  04/23/2021 3.2 (L) 3.5 - 5.1 mmol/L Final          Passed - Patient is not pregnant      Passed - Last BP in normal range    BP Readings from Last 1 Encounters:  04/23/21 131/71          Passed - Valid encounter within last 6 months    Recent Outpatient Visits           5 months ago Thumb pain, right   [place], [place], [place]   7 months ago Uncontrolled type 2 diabetes mellitus with hyperglycemia [facility])   [place], Annie Main L, RPH-CPP   7 months ago Uncontrolled type 2 diabetes mellitus with hyperglycemia [facility])   [place], Annie Main L, RPH-CPP   8 months ago Uncontrolled type 2 diabetes mellitus with hyperglycemia [facility])   [place], Vernia Buff, NP   10 months ago Shortness of breath at  rest   [place], Vernia Buff, NP       Future Appointments             Today [name], NP [place]             rivaroxaban (XARELTO) 20 MG TABS tablet 30 tablet 0    Sig: Take 1 tablet (20 mg total) by mouth daily with supper.     Hematology: Anticoagulants - rivaroxaban Failed - 05/30/2021 12:18 PM      Failed - Cr in normal range and within 360 days    Creat  Date Value Ref Range Status  08/06/2016 0.81 0.50 - 1.10 mg/dL Final   Creatinine, Ser  Date Value Ref Range Status  04/23/2021 1.10 (H) 0.44 - 1.00 mg/dL Final   Creatinine, Urine  Date Value Ref Range Status  08/06/2018 197.25 mg/dL Final    Comment:    Performed at [facility], [place] [address], [place], [place] [address]          Failed - HCT in normal range and within 360 days    HCT  Date Value Ref Range Status  04/22/2021 34.6 (L) 36.0 - 46.0 % Final   Hematocrit  Date Value Ref Range Status  09/17/2020 39.7 34.0 - 46.6 % Final          Failed - HGB in normal range and within 360 days    Hemoglobin  Date Value Ref Range Status  04/22/2021 10.8 (L) 12.0 - 15.0 g/dL Final  09/17/2020 12.4 11.1 - 15.9 g/dL Final          Passed - ALT in normal range and within 180 days    ALT  Date Value Ref Range Status  04/22/2021 12 0 - 44 U/L Final          Passed - AST in normal range and within 180 days    AST  Date Value Ref Range Status  04/22/2021 20 15 - 41 U/L Final          Passed - PLT in normal range and within 360 days    Platelets  Date Value Ref Range Status  04/22/2021 186 150 - 400 K/uL Final  09/17/2020 208 150 - 450 x10E3/uL Final          Passed - Valid encounter within last 12 months    Recent Outpatient Visits           5 months ago Thumb pain, right   [place], [place], [place]   7 months ago Uncontrolled type 2 diabetes mellitus with  hyperglycemia [facility])   [place], Annie Main L, RPH-CPP   7 months ago Uncontrolled type 2 diabetes mellitus with hyperglycemia [facility])   [place], Annie Main L, RPH-CPP   8 months ago Uncontrolled type 2 diabetes mellitus with hyperglycemia [facility])   [place], Vernia Buff, NP   10 months ago Shortness of breath at rest   [place], Vernia Buff, NP       Future Appointments             Today [name], NP [place]

## 2021-06-04 ENCOUNTER — Other Ambulatory Visit: Payer: Self-pay

## 2021-06-04 ENCOUNTER — Other Ambulatory Visit: Payer: Self-pay | Admitting: Internal Medicine

## 2021-06-05 ENCOUNTER — Other Ambulatory Visit: Payer: Self-pay

## 2021-06-06 ENCOUNTER — Other Ambulatory Visit: Payer: Self-pay | Admitting: Internal Medicine

## 2021-06-06 ENCOUNTER — Telehealth: Payer: Self-pay | Admitting: Nurse Practitioner

## 2021-06-06 ENCOUNTER — Other Ambulatory Visit: Payer: Self-pay

## 2021-06-06 ENCOUNTER — Other Ambulatory Visit: Payer: Self-pay | Admitting: Cardiology

## 2021-06-06 NOTE — Telephone Encounter (Signed)
Pt's mom is asking for medication refill for    Rx #: [id number]  rivaroxaban (XARELTO) 20 MG TABS tablet [id number]  ENDED   [facility]

## 2021-06-09 ENCOUNTER — Other Ambulatory Visit: Payer: Self-pay | Admitting: Nurse Practitioner

## 2021-06-09 ENCOUNTER — Other Ambulatory Visit: Payer: Self-pay

## 2021-06-09 MED ORDER — RIVAROXABAN 20 MG PO TABS
20.0000 mg | ORAL_TABLET | Freq: Every day | ORAL | 1 refills | Status: DC
  Filled 2021-06-09: qty 90, 90d supply, fill #0

## 2021-06-09 NOTE — Telephone Encounter (Signed)
Routing to PCP for review.

## 2021-06-09 NOTE — Telephone Encounter (Signed)
Patients mother is calling back about picking up zeralto medicaiton, says she recieved a cal

## 2021-06-10 ENCOUNTER — Other Ambulatory Visit: Payer: Self-pay

## 2021-06-10 NOTE — Telephone Encounter (Signed)
MEDICATION SENT 

## 2021-06-10 NOTE — Telephone Encounter (Signed)
Called the patient and verified that is was her by her birthday. I informed her that the medications was already sent to the pharmacy.

## 2021-06-11 ENCOUNTER — Other Ambulatory Visit: Payer: Self-pay

## 2021-06-13 DIAGNOSIS — J849 Interstitial pulmonary disease, unspecified: Secondary | ICD-10-CM | POA: Diagnosis not present

## 2021-06-13 DIAGNOSIS — I2781 Cor pulmonale (chronic): Secondary | ICD-10-CM | POA: Diagnosis not present

## 2021-06-17 ENCOUNTER — Ambulatory Visit: Payer: Medicaid Other | Attending: Nurse Practitioner | Admitting: Nurse Practitioner

## 2021-06-17 ENCOUNTER — Other Ambulatory Visit: Payer: Self-pay

## 2021-06-17 VITALS — BP 127/88 | HR 92 | Ht 67.0 in | Wt 336.0 lb

## 2021-06-17 DIAGNOSIS — I5032 Chronic diastolic (congestive) heart failure: Secondary | ICD-10-CM | POA: Diagnosis not present

## 2021-06-17 DIAGNOSIS — Z886 Allergy status to analgesic agent status: Secondary | ICD-10-CM | POA: Diagnosis not present

## 2021-06-17 DIAGNOSIS — Z79899 Other long term (current) drug therapy: Secondary | ICD-10-CM | POA: Diagnosis not present

## 2021-06-17 DIAGNOSIS — I2729 Other secondary pulmonary hypertension: Secondary | ICD-10-CM | POA: Diagnosis not present

## 2021-06-17 DIAGNOSIS — E1165 Type 2 diabetes mellitus with hyperglycemia: Secondary | ICD-10-CM | POA: Diagnosis not present

## 2021-06-17 DIAGNOSIS — I110 Hypertensive heart disease with heart failure: Secondary | ICD-10-CM | POA: Insufficient documentation

## 2021-06-17 DIAGNOSIS — G4733 Obstructive sleep apnea (adult) (pediatric): Secondary | ICD-10-CM | POA: Insufficient documentation

## 2021-06-17 DIAGNOSIS — Z09 Encounter for follow-up examination after completed treatment for conditions other than malignant neoplasm: Secondary | ICD-10-CM | POA: Diagnosis not present

## 2021-06-17 DIAGNOSIS — Z6841 Body Mass Index (BMI) 40.0 and over, adult: Secondary | ICD-10-CM | POA: Diagnosis not present

## 2021-06-17 DIAGNOSIS — I10 Essential (primary) hypertension: Secondary | ICD-10-CM | POA: Diagnosis not present

## 2021-06-17 DIAGNOSIS — Z7984 Long term (current) use of oral hypoglycemic drugs: Secondary | ICD-10-CM | POA: Insufficient documentation

## 2021-06-17 DIAGNOSIS — I2781 Cor pulmonale (chronic): Secondary | ICD-10-CM | POA: Insufficient documentation

## 2021-06-17 DIAGNOSIS — Z888 Allergy status to other drugs, medicaments and biological substances status: Secondary | ICD-10-CM | POA: Insufficient documentation

## 2021-06-17 DIAGNOSIS — Z8249 Family history of ischemic heart disease and other diseases of the circulatory system: Secondary | ICD-10-CM | POA: Insufficient documentation

## 2021-06-17 DIAGNOSIS — I4819 Other persistent atrial fibrillation: Secondary | ICD-10-CM | POA: Diagnosis not present

## 2021-06-17 DIAGNOSIS — Z7901 Long term (current) use of anticoagulants: Secondary | ICD-10-CM | POA: Insufficient documentation

## 2021-06-17 MED ORDER — DULAGLUTIDE 1.5 MG/0.5ML ~~LOC~~ SOAJ
1.5000 mg | SUBCUTANEOUS | 2 refills | Status: DC
  Filled 2021-06-17 – 2021-06-25 (×2): qty 6, 84d supply, fill #0

## 2021-06-17 NOTE — Progress Notes (Signed)
Assessment & Plan:  [name] was seen today for hospitalization follow-up.  Diagnoses and all orders for this visit:  Hospital discharge follow-up  Essential hypertension Continue all antihypertensives as prescribed.  Remember to bring in your blood pressure log with you for your follow up appointment.  DASH/Mediterranean Diets are healthier choices for HTN.    Uncontrolled type 2 diabetes mellitus with hyperglycemia (HCC) -     Dulaglutide 1.5 MG/0.5ML SOPN; INJECT 1.5 MG INTO THE SKIN ONCE A WEEK. Continue blood sugar control as discussed in office today, low carbohydrate diet, and regular physical exercise as tolerated, 150 minutes per week (30 min each day, 5 days per week, or 50 min 3 days per week). Keep blood sugar logs with fasting goal of 90-130 mg/dl, post prandial (after you eat) less than 180.  For Hypoglycemia: BS <60 and Hyperglycemia BS >400; contact the clinic ASAP. Annual eye exams and foot exams are recommended.   Patient has been counseled on age-appropriate routine health concerns for screening and prevention. These are reviewed and up-to-date. Referrals have been placed accordingly. Immunizations are up-to-date or declined.    Subjective:   Chief Complaint  Patient presents with   Hospitalization Follow-up   HPI [name] [age] y.o. female presents to office today for HFU She is accompanied by her mother  PMH significant for:  AonC diastolic congestive heart failure (11/02/2013), Benign essential HTN (11/28/2013), Bipolar disease, chronic, Cor pulmonale, History of thyrotoxicosis, DM2,  Mediastinal adenopathy,  Morbid obesity with BMI >50, OSA (obstructive sleep apnea) DOES NOT USE BIPAP,   Persistent Afib (12/09/2017), Pulmonary HTN, moderate to severe (11/03/2013),  SVT (12/06/2013)   She was admitted to the hospital on 04-17-2021 with [facility] heart failyre. Required IV lasix for diuresis and was discharged home in stable condition on demadex. She will continue on  xarelto for history of afib longterm at this time.  She is overdue for follow up with Cardiology.   She has no questions or concerns today.    HTN Controlled. She has been prescribed amlodipine 10 mg daily, coreg 25 mg BID, losartan 25 mg daily, hydralazine 25 mg TID and demadex 80 mg BID. Her carvedilol appears to be expired as well as her losartan and hydralazine. Not clear if there is a component of noncompliance.  BP Readings from Last 3 Encounters:  06/17/21 127/88  04/23/21 131/71  04/03/21 (!) 158/100    Review of Systems  Constitutional:  Negative for fever, malaise/fatigue and weight loss.  HENT: Negative.  Negative for nosebleeds.   Eyes: Negative.  Negative for blurred vision, double vision and photophobia.  Respiratory: Negative.  Negative for cough and shortness of breath.   Cardiovascular: Negative.  Negative for chest pain, palpitations and leg swelling.  Gastrointestinal: Negative.  Negative for heartburn, nausea and vomiting.  Musculoskeletal: Negative.  Negative for myalgias.  Neurological: Negative.  Negative for dizziness, focal weakness, seizures and headaches.  Psychiatric/Behavioral: Negative.  Negative for suicidal ideas.    Past Medical History:  Diagnosis Date   Acute on chronic diastolic congestive heart failure ([place]) 11/02/2013   10/03/2015, 11/13/2015, 08/03/2017   Benign essential HTN 11/28/2013   Bipolar disease, chronic ([place])    Chest pain    a. 2012 Myoview: EF 63%, no isch/infarct;  b. 04/2016 Lexiscan MV: EF 73%, no ischemia/infarct-->Low risk.   Chronic diastolic CHF (congestive heart failure) ([place]) 07/23/2011   a. 2015 Echo: EF 55-60%, Gr2 DD;  b. 09/2015 Echo: EF 60-65%, no rwma, mod dil LA,  PASP 42mmHg.   Cor pulmonale (chronic) (HCC)    History of thyrotoxicosis    HTN (hypertension) 11/28/2013   Hypertensive heart disease 10/18/2013   Hypoglycemia    Insulin dependent type 2 diabetes mellitus, uncontrolled ([place])    Mediastinal adenopathy     Morbid obesity due to excess calories ([place]) 02/19/2011   Morbid obesity with BMI of 50.0-59.9, adult (HCC)    OSA (obstructive sleep apnea) 03/06/2011   Persistent atrial fibrillation ([place]) 12/09/2017   Pulmonary HTN, moderate to severe 11/03/2013   Sinusitis, chronic 01/02/2015   SVT (supraventricular tachycardia) ([place]) 12/06/2013   Uncontrolled type 2 diabetes mellitus with hyperglycemia [facility])     Past Surgical History:  Procedure Laterality Date   CARDIOVERSION N/A 04/05/2018   Procedure: CARDIOVERSION;  Surgeon: Lelon Perla, MD;  Location: [place];  Service: Cardiovascular;  Laterality: N/A;   COLONOSCOPY WITH PROPOFOL Left 07/16/2018   Procedure: COLONOSCOPY WITH PROPOFOL;  Surgeon: Ronnette Juniper, MD;  Location: [facility];  Service: Gastroenterology;  Laterality: Left;   LEFT HEART CATH AND CORONARY ANGIOGRAPHY N/A 08/04/2018   Procedure: LEFT HEART CATH AND CORONARY ANGIOGRAPHY;  Surgeon: Belva Crome, MD;  Location: [place] [facility];  Service: Cardiovascular;  Laterality: N/A;   None     POLYPECTOMY  07/16/2018   Procedure: POLYPECTOMY;  Surgeon: Ronnette Juniper, MD;  Location: [facility];  Service: Gastroenterology;;    Family History  Problem Relation Age of Onset   Heart failure Father    Stroke Father    Hypertension Mother    Heart disease Maternal Grandfather     Social History Reviewed with no changes to be made today.   Outpatient Medications Prior to Visit  Medication Sig Dispense Refill   Accu-Chek Softclix Lancets lancets USE AS DIRECTED TO TEST BLOOD SUGAR THREE TIMES DAILY 200 each 12   Blood Glucose Monitoring Suppl (ACCU-CHEK AVIVA PLUS) w/Device KIT USE AS DIRECTED TO CHECK BLOOD SUGAR ONCE DAILY 1 kit 0   Blood Pressure Monitor DEVI Please provide patient with insurance approved blood pressure monitor I10.0 1 each 0   escitalopram (LEXAPRO) 20 MG tablet TAKE 1 TABLET BY MOUTH EVERY MORNING 30 tablet 2   glipiZIDE (GLUCOTROL XL) 10 MG 24 hr tablet TAKE  1 TABLET (10 MG TOTAL) BY MOUTH EVERY EVENING. 90 tablet 1   glucose blood test strip USE AS DIRECTED TO TEST BLOOD SUGAR THREE TIMES DAILY 200 strip 12   glucose blood test strip USE AS DIRECTED TO TEST BLOOD SUGAR ONCE DAILY 100 strip 12   hydrOXYzine (ATARAX/VISTARIL) 25 MG tablet Take 1 tablet (25 mg total) by mouth 3 (three) times daily as needed for itching. 30 tablet 0   Incontinence Supply Disposable (INCONTINENCE BRIEF LARGE) MISC Please provide patient with insurance approved incontinence supplies/briefs 18 each 6   Misc. Devices MISC Please provide BiPAP machine with the following: Settings 22/18 cm H2O. Medium  size [employer] Full Face Mask Simplus mask and heated  humidification. 1 each 0   Misc. Devices MISC Please provide patient a Blood Pressure Monitor w/ insurance approval. 1 Device 0   Naphazoline-Pheniramine (EYE ALLERGY RELIEF OP) Place 1 drop into both eyes daily as needed (allergy).     rivaroxaban (XARELTO) 20 MG TABS tablet Take 1 tablet (20 mg total) by mouth daily with supper. 90 tablet 1   solifenacin (VESICARE) 10 MG tablet Take 1 tablet by mouth once daily 30 tablet 11   torsemide (DEMADEX) 20 MG tablet  TAKE 4 TABLETS (80 MG TOTAL) BY MOUTH 2 (TWO) TIMES DAILY. 240 tablet 11   amLODipine (NORVASC) 10 MG tablet Take 1 tablet (10 mg total) by mouth daily. 30 tablet 0   Dulaglutide 1.5 MG/0.5ML SOPN INJECT 1.5 MG INTO THE SKIN ONCE A WEEK. 2 mL 2   metFORMIN (GLUCOPHAGE) 1000 MG tablet Take 1 tablet (1,000 mg total) by mouth 2 (two) times daily with a meal. 60 tablet 0   carvedilol (COREG) 25 MG tablet Take 1 tablet (25 mg total) by mouth 2 (two) times daily with a meal. 60 tablet 0   hydrALAZINE (APRESOLINE) 25 MG tablet Take 1 tablet (25 mg total) by mouth every 8 (eight) hours. 90 tablet 0   losartan (COZAAR) 25 MG tablet Take 1 tablet (25 mg total) by mouth daily. 30 tablet 0   risperidone (RISPERDAL) 4 MG tablet Take 1 tablet (4 mg total) by mouth daily. 30  tablet 0   No facility-administered medications prior to visit.    Allergies  Allergen Reactions   Acetaminophen Other (See Comments)    Seizure-like "fits" as a child   Caffeine     Tense, anxiety, increased urination   [place] [Dapagliflozin] Other (See Comments)    Hallucinations, drop in blood sugar   Lisinopril Rash    Rash with lisinopril; but fosinopril is ok per patient       Objective:    BP 127/88   Pulse 92   Ht _0  (1.702 m)   Wt (!) 336 lb (152.4 kg)   SpO2 94%   BMI 52.63 kg/m  Wt Readings from Last 3 Encounters:  06/17/21 (!) 336 lb (152.4 kg)  04/23/21 (!) 351 lb 8 oz (159.4 kg)  03/29/21 (!) 333 lb (151 kg)    Physical Exam Vitals and nursing note reviewed.  Constitutional:      Appearance: She is well-developed.  HENT:     Head: Normocephalic and atraumatic.  Cardiovascular:     Rate and Rhythm: Normal rate and regular rhythm.     Heart sounds: Normal heart sounds. No murmur heard.   No friction rub. No gallop.  Pulmonary:     Effort: Pulmonary effort is normal. No tachypnea or respiratory distress.     Breath sounds: Normal breath sounds. No decreased breath sounds, wheezing, rhonchi or rales.  Chest:     Chest wall: No tenderness.  Abdominal:     General: Bowel sounds are normal.     Palpations: Abdomen is soft.  Musculoskeletal:        General: Normal range of motion.     Cervical back: Normal range of motion.  Skin:    General: Skin is warm and dry.  Neurological:     Mental Status: She is alert and oriented to person, place, and time.     Coordination: Coordination normal.  Psychiatric:        Behavior: Behavior normal. Behavior is cooperative.        Thought Content: Thought content normal.        Judgment: Judgment normal.         Patient has been counseled extensively about nutrition and exercise as well as the importance of adherence with medications and regular follow-up. The patient was given clear instructions to go  to ER or return to medical center if symptoms don't improve, worsen or new problems develop. The patient verbalized understanding.   Follow-up: Return in about 6 weeks (around 07/29/2021) for DM.   Philippe Gang  Leanne Chang, FNP-BC [facility] and [facility] [place], [place]   06/22/2021, 6:52 PM

## 2021-06-18 ENCOUNTER — Other Ambulatory Visit: Payer: Self-pay

## 2021-06-21 ENCOUNTER — Other Ambulatory Visit: Payer: Self-pay | Admitting: Family Medicine

## 2021-06-21 ENCOUNTER — Other Ambulatory Visit: Payer: Self-pay

## 2021-06-21 DIAGNOSIS — I5032 Chronic diastolic (congestive) heart failure: Secondary | ICD-10-CM

## 2021-06-21 DIAGNOSIS — I110 Hypertensive heart disease with heart failure: Secondary | ICD-10-CM

## 2021-06-22 ENCOUNTER — Encounter: Payer: Self-pay | Admitting: Nurse Practitioner

## 2021-06-22 MED ORDER — AMLODIPINE BESYLATE 10 MG PO TABS
10.0000 mg | ORAL_TABLET | Freq: Every day | ORAL | 0 refills | Status: DC
  Filled 2021-06-22: qty 30, 30d supply, fill #0

## 2021-06-22 MED ORDER — METFORMIN HCL 1000 MG PO TABS
1000.0000 mg | ORAL_TABLET | Freq: Two times a day (BID) | ORAL | 0 refills | Status: DC
  Filled 2021-06-22: qty 60, 30d supply, fill #0

## 2021-06-23 ENCOUNTER — Other Ambulatory Visit: Payer: Self-pay

## 2021-06-24 ENCOUNTER — Other Ambulatory Visit: Payer: Self-pay | Admitting: Nurse Practitioner

## 2021-06-24 ENCOUNTER — Other Ambulatory Visit: Payer: Self-pay

## 2021-06-24 NOTE — Telephone Encounter (Signed)
Requested medications are due for refill today.  yes  Requested medications are on the active medications list.  yes  Last refill. 04/24/2021  Future visit scheduled.   yes  Notes to clinic.  Medication not delegated.

## 2021-06-25 ENCOUNTER — Other Ambulatory Visit: Payer: Self-pay

## 2021-06-26 MED ORDER — RISPERIDONE 4 MG PO TABS
4.0000 mg | ORAL_TABLET | Freq: Every day | ORAL | 0 refills | Status: DC
  Filled 2021-06-26 – 2021-07-07 (×2): qty 30, 30d supply, fill #0

## 2021-06-27 ENCOUNTER — Other Ambulatory Visit: Payer: Self-pay

## 2021-07-02 ENCOUNTER — Other Ambulatory Visit: Payer: Self-pay

## 2021-07-04 ENCOUNTER — Other Ambulatory Visit: Payer: Self-pay

## 2021-07-07 ENCOUNTER — Other Ambulatory Visit: Payer: Self-pay | Admitting: Nurse Practitioner

## 2021-07-07 ENCOUNTER — Other Ambulatory Visit: Payer: Self-pay

## 2021-07-08 NOTE — Telephone Encounter (Signed)
Requested medication (s) are on the active medication list No. This medication has not shown up on the patient's medication list-was ordered by CPhT on 07/07/21.   Note to clinic-Routing to provider for approval.   Requested Prescriptions  Pending Prescriptions Disp Refills   empagliflozin (JARDIANCE) 10 MG TABS tablet 30 tablet 0    Sig: Take 1 tablet (10 mg total) by mouth daily.     Endocrinology:  Diabetes - SGLT2 Inhibitors Failed - 07/07/2021  3:58 PM      Failed - Cr in normal range and within 360 days    Creat  Date Value Ref Range Status  08/06/2016 0.81 0.50 - 1.10 mg/dL Final   Creatinine, Ser  Date Value Ref Range Status  04/23/2021 1.10 (H) 0.44 - 1.00 mg/dL Final   Creatinine, Urine  Date Value Ref Range Status  08/06/2018 197.25 mg/dL Final    Comment:    Performed at [facility], [place] [address], [place], [place] [address]          Failed - HBA1C is between 0 and 7.9 and within 180 days    HbA1c, POC (controlled diabetic range)  Date Value Ref Range Status  09/16/2018 7.1 (A) 0.0 - 7.0 % Final   Hgb A1c MFr Bld  Date Value Ref Range Status  04/17/2021 9.7 (H) 4.8 - 5.6 % Final    Comment:    (NOTE) Pre diabetes:          5.7%-6.4%  Diabetes:              >6.4%  Glycemic control for   <7.0% adults with diabetes           Passed - LDL in normal range and within 360 days    LDL Chol Calc (NIH)  Date Value Ref Range Status  10/02/2020 52 0 - 99 mg/dL Final          Passed - eGFR in normal range and within 360 days    GFR, Est African American  Date Value Ref Range Status  08/06/2016 >89 >=60 mL/min Final   GFR calc Af Amer  Date Value Ref Range Status  09/17/2020 77 >59 mL/min/1.73 Final    Comment:    **In accordance with recommendations from the NKF-ASN Task force,**   [facility] is in the process of updating its eGFR calculation to the   2021 CKD-EPI creatinine equation that estimates kidney function   without a race  variable.    GFR, Est Non African American  Date Value Ref Range Status  08/06/2016 86 >=60 mL/min Final   GFR, Estimated  Date Value Ref Range Status  04/23/2021 >60 >60 mL/min Final    Comment:    (NOTE) Calculated using the CKD-EPI Creatinine Equation (2021)    GFR  Date Value Ref Range Status  12/25/2013 117.88 >60.00 mL/min Final   eGFR  Date Value Ref Range Status  12/26/2020 65 >59 mL/min/1.73 Final          Passed - Valid encounter within last 6 months    Recent Outpatient Visits           3 weeks ago Hospital discharge follow-up   [place] [name], NP   6 months ago Thumb pain, right   [place], [place]   8 months ago Uncontrolled type 2 diabetes mellitus with hyperglycemia [facility])   [place]  Tresa Endo, RPH-CPP   8 months ago Uncontrolled type 2 diabetes mellitus with hyperglycemia [facility])   [place], Jarome Matin, RPH-CPP   9 months ago Uncontrolled type 2 diabetes mellitus with hyperglycemia [facility])   [place] [name], NP       Future Appointments             In 3 weeks [name], NP [place]

## 2021-07-09 ENCOUNTER — Other Ambulatory Visit: Payer: Self-pay

## 2021-07-09 MED ORDER — EMPAGLIFLOZIN 10 MG PO TABS
10.0000 mg | ORAL_TABLET | Freq: Every day | ORAL | 0 refills | Status: DC
  Filled 2021-07-09: qty 30, 30d supply, fill #0

## 2021-07-09 MED FILL — Glipizide Tab ER 24HR 10 MG: ORAL | 90 days supply | Qty: 90 | Fill #1 | Status: AC

## 2021-07-10 ENCOUNTER — Other Ambulatory Visit: Payer: Self-pay | Admitting: Internal Medicine

## 2021-07-10 ENCOUNTER — Other Ambulatory Visit: Payer: Self-pay

## 2021-07-11 ENCOUNTER — Other Ambulatory Visit: Payer: Self-pay

## 2021-07-13 DIAGNOSIS — J849 Interstitial pulmonary disease, unspecified: Secondary | ICD-10-CM | POA: Diagnosis not present

## 2021-07-13 DIAGNOSIS — I2781 Cor pulmonale (chronic): Secondary | ICD-10-CM | POA: Diagnosis not present

## 2021-07-21 ENCOUNTER — Other Ambulatory Visit: Payer: Self-pay

## 2021-07-30 ENCOUNTER — Ambulatory Visit: Payer: Medicaid Other | Attending: Nurse Practitioner | Admitting: Nurse Practitioner

## 2021-07-30 ENCOUNTER — Other Ambulatory Visit: Payer: Self-pay

## 2021-07-30 ENCOUNTER — Encounter: Payer: Self-pay | Admitting: Nurse Practitioner

## 2021-07-30 ENCOUNTER — Other Ambulatory Visit: Payer: Self-pay | Admitting: Pharmacist

## 2021-07-30 ENCOUNTER — Other Ambulatory Visit: Payer: Self-pay | Admitting: Family Medicine

## 2021-07-30 VITALS — BP 161/115 | HR 78 | Ht 67.0 in | Wt 328.2 lb

## 2021-07-30 DIAGNOSIS — F419 Anxiety disorder, unspecified: Secondary | ICD-10-CM | POA: Diagnosis not present

## 2021-07-30 DIAGNOSIS — I110 Hypertensive heart disease with heart failure: Secondary | ICD-10-CM | POA: Diagnosis not present

## 2021-07-30 DIAGNOSIS — I5032 Chronic diastolic (congestive) heart failure: Secondary | ICD-10-CM

## 2021-07-30 DIAGNOSIS — Z23 Encounter for immunization: Secondary | ICD-10-CM

## 2021-07-30 DIAGNOSIS — E1165 Type 2 diabetes mellitus with hyperglycemia: Secondary | ICD-10-CM

## 2021-07-30 DIAGNOSIS — I4819 Other persistent atrial fibrillation: Secondary | ICD-10-CM | POA: Diagnosis not present

## 2021-07-30 DIAGNOSIS — F32A Depression, unspecified: Secondary | ICD-10-CM

## 2021-07-30 DIAGNOSIS — D649 Anemia, unspecified: Secondary | ICD-10-CM | POA: Diagnosis not present

## 2021-07-30 DIAGNOSIS — N393 Stress incontinence (female) (male): Secondary | ICD-10-CM

## 2021-07-30 LAB — POCT GLYCOSYLATED HEMOGLOBIN (HGB A1C): Hemoglobin A1C: 6.6 % — AB (ref 4.0–5.6)

## 2021-07-30 LAB — GLUCOSE, POCT (MANUAL RESULT ENTRY): POC Glucose: 129 mg/dl — AB (ref 70–99)

## 2021-07-30 MED ORDER — ESCITALOPRAM OXALATE 20 MG PO TABS: 20.0000 mg | ORAL_TABLET | Freq: Every morning | ORAL | 2 refills | Status: DC

## 2021-07-30 MED ORDER — GLIPIZIDE ER 10 MG PO TB24: ORAL_TABLET | ORAL | 1 refills | Status: DC

## 2021-07-30 MED ORDER — METFORMIN HCL 1000 MG PO TABS: 1000.0000 mg | ORAL_TABLET | Freq: Two times a day (BID) | ORAL | 1 refills | Status: DC

## 2021-07-30 MED ORDER — LOSARTAN POTASSIUM 25 MG PO TABS
25.0000 mg | ORAL_TABLET | Freq: Every day | ORAL | 1 refills | Status: DC
  Filled 2021-07-30: qty 90, 90d supply, fill #0

## 2021-07-30 MED ORDER — RIVAROXABAN 20 MG PO TABS
20.0000 mg | ORAL_TABLET | Freq: Every day | ORAL | 1 refills | Status: DC
  Filled 2021-07-30 – 2021-10-15 (×3): qty 90, 90d supply, fill #0
  Filled 2021-10-15: qty 90, 90d supply, fill #1

## 2021-07-30 MED ORDER — GLUCOSE BLOOD VI STRP
ORAL_STRIP | 12 refills | Status: DC
  Filled 2021-07-30 – 2021-11-19 (×2): qty 100, 33d supply, fill #0

## 2021-07-30 MED ORDER — METFORMIN HCL 1000 MG PO TABS
1000.0000 mg | ORAL_TABLET | Freq: Two times a day (BID) | ORAL | 1 refills | Status: DC
  Filled 2021-07-30 – 2021-11-18 (×2): qty 180, 90d supply, fill #0

## 2021-07-30 MED ORDER — ACCU-CHEK GUIDE W/DEVICE KIT
PACK | 0 refills | Status: DC
  Filled 2021-07-30: qty 1, 30d supply, fill #0

## 2021-07-30 MED ORDER — ACCU-CHEK AVIVA PLUS W/DEVICE KIT
PACK | 0 refills | Status: DC
  Filled 2021-07-30: qty 1, fill #0

## 2021-07-30 MED ORDER — CARVEDILOL 25 MG PO TABS: 25.0000 mg | ORAL_TABLET | Freq: Two times a day (BID) | ORAL | 1 refills | Status: DC

## 2021-07-30 MED ORDER — EMPAGLIFLOZIN 10 MG PO TABS
10.0000 mg | ORAL_TABLET | Freq: Every day | ORAL | 1 refills | Status: DC
  Filled 2021-07-30 – 2021-08-20 (×4): qty 90, 90d supply, fill #0

## 2021-07-30 MED ORDER — AMLODIPINE BESYLATE 10 MG PO TABS: 10.0000 mg | ORAL_TABLET | Freq: Every day | ORAL | 1 refills | Status: DC

## 2021-07-30 MED ORDER — HYDROXYZINE HCL 25 MG PO TABS
25.0000 mg | ORAL_TABLET | Freq: Three times a day (TID) | ORAL | 0 refills | Status: DC | PRN
  Filled 2021-07-30: qty 60, 20d supply, fill #0

## 2021-07-30 MED ORDER — TORSEMIDE 40 MG PO TABS
80.0000 mg | ORAL_TABLET | Freq: Two times a day (BID) | ORAL | 1 refills | Status: DC
  Filled 2021-07-30 – 2021-09-08 (×3): qty 180, 90d supply, fill #0

## 2021-07-30 MED ORDER — CARVEDILOL 25 MG PO TABS
25.0000 mg | ORAL_TABLET | Freq: Two times a day (BID) | ORAL | 1 refills | Status: DC
  Filled 2021-07-30: qty 180, 90d supply, fill #0

## 2021-07-30 MED ORDER — LOSARTAN POTASSIUM 25 MG PO TABS: 25.0000 mg | ORAL_TABLET | Freq: Every day | ORAL | 1 refills | Status: DC

## 2021-07-30 MED ORDER — DULAGLUTIDE 1.5 MG/0.5ML ~~LOC~~ SOAJ: 1.5000 mg | SUBCUTANEOUS | 3 refills | Status: DC

## 2021-07-30 MED ORDER — TORSEMIDE 40 MG PO TABS: 80.0000 mg | ORAL_TABLET | Freq: Two times a day (BID) | ORAL | 1 refills | Status: DC

## 2021-07-30 MED ORDER — EMPAGLIFLOZIN 10 MG PO TABS: 10.0000 mg | ORAL_TABLET | Freq: Every day | ORAL | 1 refills | Status: DC

## 2021-07-30 MED ORDER — HYDRALAZINE HCL 25 MG PO TABS
25.0000 mg | ORAL_TABLET | Freq: Three times a day (TID) | ORAL | 1 refills | Status: DC
  Filled 2021-07-30: qty 270, 90d supply, fill #0

## 2021-07-30 MED ORDER — ESCITALOPRAM OXALATE 20 MG PO TABS
20.0000 mg | ORAL_TABLET | Freq: Every morning | ORAL | 2 refills | Status: DC
  Filled 2021-07-30 – 2021-11-18 (×2): qty 90, 90d supply, fill #0

## 2021-07-30 MED ORDER — RIVAROXABAN 20 MG PO TABS: 20.0000 mg | ORAL_TABLET | Freq: Every day | ORAL | 1 refills | Status: DC

## 2021-07-30 MED ORDER — AMLODIPINE BESYLATE 10 MG PO TABS
10.0000 mg | ORAL_TABLET | Freq: Every day | ORAL | 1 refills | Status: DC
  Filled 2021-07-30: qty 90, 90d supply, fill #0

## 2021-07-30 MED ORDER — GLIPIZIDE ER 10 MG PO TB24
10.0000 mg | ORAL_TABLET | Freq: Every day | ORAL | 1 refills | Status: DC
  Filled 2021-07-30 – 2021-07-31 (×2): qty 90, 90d supply, fill #0

## 2021-07-30 MED ORDER — RISPERIDONE 4 MG PO TABS
4.0000 mg | ORAL_TABLET | Freq: Every day | ORAL | 1 refills | Status: DC
  Filled 2021-07-30: qty 90, 90d supply, fill #0
  Filled 2021-11-15: qty 90, 90d supply, fill #1
  Filled 2021-11-17: qty 90, 90d supply, fill #0

## 2021-07-30 MED ORDER — DULAGLUTIDE 1.5 MG/0.5ML ~~LOC~~ SOAJ
1.5000 mg | SUBCUTANEOUS | 3 refills | Status: DC
  Filled 2021-07-30 – 2021-10-16 (×2): qty 6, 84d supply, fill #0

## 2021-07-30 MED ORDER — SOLIFENACIN SUCCINATE 10 MG PO TABS: 10.0000 mg | ORAL_TABLET | Freq: Every day | ORAL | 1 refills | Status: DC

## 2021-07-30 MED ORDER — RISPERIDONE 4 MG PO TABS: 4.0000 mg | ORAL_TABLET | Freq: Every day | ORAL | 1 refills | Status: DC

## 2021-07-30 MED ORDER — ACCU-CHEK GUIDE ME W/DEVICE KIT
PACK | 0 refills | Status: DC
  Filled 2021-07-30: qty 1, fill #0

## 2021-07-30 MED ORDER — HYDRALAZINE HCL 25 MG PO TABS: 25.0000 mg | ORAL_TABLET | Freq: Three times a day (TID) | ORAL | 1 refills | Status: DC

## 2021-07-30 MED ORDER — SOLIFENACIN SUCCINATE 10 MG PO TABS
10.0000 mg | ORAL_TABLET | Freq: Every day | ORAL | 1 refills | Status: DC
  Filled 2021-07-30 – 2021-08-20 (×2): qty 90, 90d supply, fill #0

## 2021-07-30 NOTE — Progress Notes (Signed)
Assessment & Plan:  [name] was seen today for hypertension and diabetes.  Diagnoses and all orders for this visit:  Hypertensive heart disease with chronic diastolic congestive heart failure (HCC) -     Discontinue: amLODipine (NORVASC) 10 MG tablet; Take 1 tablet (10 mg total) by mouth daily. -     CMP14+EGFR -     amLODipine (NORVASC) 10 MG tablet; Take 1 tablet (10 mg total) by mouth daily. Please fill as a 90 day supply -     hydrALAZINE (APRESOLINE) 25 MG tablet; Take 1 tablet (25 mg total) by mouth every 8 (eight) hours. Please fill as a 90 day supply -     losartan (COZAAR) 25 MG tablet; Take 1 tablet (25 mg total) by mouth daily. Please fill as a 90 day supply -     Torsemide 40 MG TABS; Take 80 mg by mouth 2 (two) times daily. Please fill as a 90 day supply  Uncontrolled type 2 diabetes mellitus with hyperglycemia (HCC) -     Discontinue: Dulaglutide 1.5 MG/0.5ML SOPN; INJECT 1.5 MG INTO THE SKIN ONCE A WEEK. -     Discontinue: glipiZIDE (GLUCOTROL XL) 10 MG 24 hr tablet; TAKE 1 TABLET (10 MG TOTAL) BY MOUTH EVERY EVENING. -     Discontinue: Blood Glucose Monitoring Suppl (ACCU-CHEK AVIVA PLUS) w/Device KIT; USE AS DIRECTED TO CHECK BLOOD SUGAR ONCE DAILY -     CMP14+EGFR -     glucose blood test strip; USE AS DIRECTED TO TEST BLOOD SUGAR THREE TIMES DAILY -     Dulaglutide 1.5 MG/0.5ML SOPN; INJECT 1.5 MG INTO THE SKIN ONCE A WEEK. -     empagliflozin (JARDIANCE) 10 MG TABS tablet; Take 1 tablet (10 mg total) by mouth daily. Please fill as a 90 day supply -     glipiZIDE (GLUCOTROL XL) 10 MG 24 hr tablet; Take 1 tablet (10 mg total) by mouth daily with breakfast. Please fill as a 90 day supply -     metFORMIN (GLUCOPHAGE) 1000 MG tablet; Take 1 tablet (1,000 mg total) by mouth 2 (two) times daily with a meal. Please fill as a 90 day supply  Anemia, unspecified type -     CBC  Persistent atrial fibrillation -     carvedilol (COREG) 25 MG tablet; Take 1 tablet (25 mg total) by  mouth 2 (two) times daily with a meal. Please fill as a 90 day supply -     rivaroxaban (XARELTO) 20 MG TABS tablet; Take 1 tablet (20 mg total) by mouth daily with supper. Please fill as a 90 day supply  Anxiety and depression -     escitalopram (LEXAPRO) 20 MG tablet; Take 1 tablet (20 mg total) by mouth every morning. Please fill as a 90 day supply -     hydrOXYzine (ATARAX/VISTARIL) 25 MG tablet; Take 1 tablet (25 mg total) by mouth 3 (three) times daily as needed for itching. -     risperidone (RISPERDAL) 4 MG tablet; Take 1 tablet (4 mg total) by mouth daily. Please fill as a 90 day supply  Stress incontinence -     solifenacin (VESICARE) 10 MG tablet; Take 1 tablet (10 mg total) by mouth daily. Please fill as a 90 day supply  Need for immunization against influenza -     Flu Vaccine QUAD 31moIM (Fluarix, Fluzone & Alfiuria Quad PF)   Patient has been counseled on age-appropriate routine health concerns for screening and prevention. These are reviewed  and up-to-date. Referrals have been placed accordingly. Immunizations are up-to-date or declined.    Subjective:   Chief Complaint  Patient presents with   Hypertension   Diabetes   HPI [name] [age] y.o. female presents to office today for DM follow up. She is accompanied by her mother today who is also providing some of [name]'s current medical history  PMH significant for:  AonC diastolic congestive heart failure (11/02/2013), Benign essential HTN (11/28/2013), Bipolar disease, chronic, Cor pulmonale, History of thyrotoxicosis, DM2,  Mediastinal adenopathy,  Morbid obesity with BMI >50, OSA (obstructive sleep apnea) DOES NOT USE BIPAP,   Persistent Afib (12/09/2017), Pulmonary HTN, moderate to severe (11/03/2013),  SVT (12/06/2013)   HTN Blood pressure is not well controlled today. She states she has been out of several of her medications from the pharmacy including her blood pressure medication carvedilol 25 mg BID. She endorses  adherence taking hydralazine 25 mg TID, losartan 25 mg daily, amlodipine 10 mg daily and torsemide 80 mg BID BP Readings from Last 3 Encounters:  07/30/21 (!) 161/115  06/17/21 127/88  04/23/21 131/71     DM 2 Well controlled with metformin 7416 mg BID, trulicity 1.5 mg weekly and and jardiance 10 mg daily.  Lab Results  Component Value Date   HGBA1C 6.6 (A) 07/30/2021     Anxiety and Depression Currently prescribed lexapro 20 mg daily and hydroxyzine 25 mg TID prn.  Review of Systems  Constitutional:  Negative for fever, malaise/fatigue and weight loss.  HENT: Negative.  Negative for nosebleeds.   Eyes: Negative.  Negative for blurred vision, double vision and photophobia.  Respiratory: Negative.  Negative for cough and shortness of breath.   Cardiovascular: Negative.  Negative for chest pain, palpitations and leg swelling.  Gastrointestinal: Negative.  Negative for heartburn, nausea and vomiting.  Genitourinary:  Positive for urgency (controlled with vesicare). Negative for dysuria, flank pain, frequency and hematuria.  Musculoskeletal: Negative.  Negative for myalgias.  Neurological: Negative.  Negative for dizziness, focal weakness, seizures and headaches.  Psychiatric/Behavioral:  Positive for depression. Negative for suicidal ideas. The patient is nervous/anxious.    Past Medical History:  Diagnosis Date   Acute on chronic diastolic congestive heart failure ([place]) 11/02/2013   10/03/2015, 11/13/2015, 08/03/2017   Benign essential HTN 11/28/2013   Bipolar disease, chronic ([place])    Chest pain    a. 2012 Myoview: EF 63%, no isch/infarct;  b. 04/2016 Lexiscan MV: EF 73%, no ischemia/infarct-->Low risk.   Chronic diastolic CHF (congestive heart failure) ([place]) 07/23/2011   a. 2015 Echo: EF 55-60%, Gr2 DD;  b. 09/2015 Echo: EF 60-65%, no rwma, mod dil LA, PASP 66mHg.   Cor pulmonale (chronic) (HCC)    History of thyrotoxicosis    HTN (hypertension) 11/28/2013   Hypertensive heart  disease 10/18/2013   Hypoglycemia    Insulin dependent type 2 diabetes mellitus, uncontrolled    Mediastinal adenopathy    Morbid obesity due to excess calories ([facility] 02/19/2011   Morbid obesity with BMI of 50.0-59.9, adult (HCC)    OSA (obstructive sleep apnea) 03/06/2011   Persistent atrial fibrillation ([facility] 12/09/2017   Pulmonary HTN, moderate to severe 11/03/2013   Sinusitis, chronic 01/02/2015   SV[facility]supra[facility]ricular tachycardia) [place] 12/06/2013   Uncontrolled type 2 diabetes mellitus with hyperglycemia ([facility]     Past Surgical History:  Procedure Laterality Date   CARDIOVERSION N/A 04/05/2018   Procedure: CARDIOVERSION;  Surgeon: CLelon Perla MD;  [facility]  Service: Cardiovascular;  Laterality:  N/A;   COLONOSCOPY WITH PROPOFOL Left 07/16/2018   Procedure: COLONOSCOPY WITH PROPOFOL;  Surgeon: Ronnette Juniper, MD;  Location: [facility];  Service: Gastroenterology;  Laterality: Left;   LEFT HEART CATH AND CORONARY ANGIOGRAPHY N/A 08/04/2018   Procedure: LEFT HEART CATH AND CORONARY ANGIOGRAPHY;  Surgeon: Belva Crome, MD;  Location: [facility];  Service: Cardiovascular;  Laterality: N/A;   None     POLYPECTOMY  07/16/2018   Procedure: POLYPECTOMY;  Surgeon: Ronnette Juniper, MD;  Location: [facility];  Service: Gastroenterology;;    Family History  Problem Relation Age of Onset   Heart failure Father    Stroke Father    Hypertension Mother    Heart disease Maternal Grandfather     Social History Reviewed with no changes to be made today.   Outpatient Medications Prior to Visit  Medication Sig Dispense Refill   Accu-Chek Softclix Lancets lancets USE AS DIRECTED TO TEST BLOOD SUGAR THREE TIMES DAILY 200 each 12   Blood Pressure Monitor DEVI Please provide patient with insurance approved blood pressure monitor I10.0 1 each 0   Incontinence Supply Disposable (INCONTINENCE BRIEF LARGE) MISC Please provide patient with insurance approved incontinence  supplies/briefs 18 each 6   Misc. Devices MISC Please provide BiPAP machine with the following: Settings 22/18 cm H2O. Medium  size [employer] Full Face Mask Simplus mask and heated  humidification. 1 each 0   Misc. Devices MISC Please provide patient a Blood Pressure Monitor w/ insurance approval. 1 Device 0   Naphazoline-Pheniramine (EYE ALLERGY RELIEF OP) Place 1 drop into both eyes daily as needed (allergy).     Blood Glucose Monitoring Suppl (ACCU-CHEK AVIVA PLUS) w/Device KIT USE AS DIRECTED TO CHECK BLOOD SUGAR ONCE DAILY 1 kit 0   Dulaglutide 1.5 MG/0.5ML SOPN INJECT 1.5 MG INTO THE SKIN ONCE A WEEK. 2 mL 2   empagliflozin (JARDIANCE) 10 MG TABS tablet Take 1 tablet (10 mg total) by mouth daily. 30 tablet 0   escitalopram (LEXAPRO) 20 MG tablet TAKE 1 TABLET BY MOUTH EVERY MORNING 30 tablet 2   glipiZIDE (GLUCOTROL XL) 10 MG 24 hr tablet TAKE 1 TABLET (10 MG TOTAL) BY MOUTH EVERY EVENING. 90 tablet 1   glucose blood test strip USE AS DIRECTED TO TEST BLOOD SUGAR THREE TIMES DAILY 200 strip 12   glucose blood test strip USE AS DIRECTED TO TEST BLOOD SUGAR ONCE DAILY 100 strip 12   hydrOXYzine (ATARAX/VISTARIL) 25 MG tablet Take 1 tablet (25 mg total) by mouth 3 (three) times daily as needed for itching. 30 tablet 0   risperidone (RISPERDAL) 4 MG tablet Take 1 tablet (4 mg total) by mouth daily. 30 tablet 0   rivaroxaban (XARELTO) 20 MG TABS tablet Take 1 tablet (20 mg total) by mouth daily with supper. 90 tablet 1   solifenacin (VESICARE) 10 MG tablet Take 1 tablet by mouth once daily 30 tablet 11   torsemide (DEMADEX) 20 MG tablet TAKE 4 TABLETS (80 MG TOTAL) BY MOUTH 2 (TWO) TIMES DAILY. 240 tablet 11   amLODipine (NORVASC) 10 MG tablet Take 1 tablet (10 mg total) by mouth daily. 30 tablet 0   carvedilol (COREG) 25 MG tablet Take 1 tablet (25 mg total) by mouth 2 (two) times daily with a meal. 60 tablet 0   hydrALAZINE (APRESOLINE) 25 MG tablet Take 1 tablet (25 mg total) by mouth  every 8 (eight) hours. 90 tablet 0   losartan (COZAAR) 25 MG tablet Take  1 tablet (25 mg total) by mouth daily. 30 tablet 0   metFORMIN (GLUCOPHAGE) 1000 MG tablet Take 1 tablet (1,000 mg total) by mouth 2 (two) times daily with a meal. 60 tablet 0   No facility-administered medications prior to visit.    Allergies  Allergen Reactions   Acetaminophen Other (See Comments)    Seizure-like "fits" as a child   Caffeine     Tense, anxiety, increased urination   [place] [Dapagliflozin] Other (See Comments)    Hallucinations, drop in blood sugar   Lisinopril Rash    Rash with lisinopril; but fosinopril is ok per patient       Objective:    BP (!) 161/115   Pulse 78   Ht _0  (1.702 m)   Wt (!) 328 lb 4 oz (148.9 kg)   SpO2 95%   BMI 51.41 kg/m  Wt Readings from Last 3 Encounters:  07/30/21 (!) 328 lb 4 oz (148.9 kg)  06/17/21 (!) 336 lb (152.4 kg)  04/23/21 (!) 351 lb 8 oz (159.4 kg)    Physical Exam Vitals and nursing note reviewed.  Constitutional:      Appearance: She is well-developed.  HENT:     Head: Normocephalic and atraumatic.  Cardiovascular:     Rate and Rhythm: Normal rate. Rhythm irregular.     Heart sounds: Normal heart sounds. No murmur heard.   No friction rub. No gallop.  Pulmonary:     Effort: Pulmonary effort is normal. No tachypnea or respiratory distress.     Breath sounds: Normal breath sounds. No decreased breath sounds, wheezing, rhonchi or rales.  Chest:     Chest wall: No tenderness.  Abdominal:     General: Bowel sounds are normal.     Palpations: Abdomen is soft.  Musculoskeletal:        General: Normal range of motion.     Cervical back: Normal range of motion.  Skin:    General: Skin is warm and dry.  Neurological:     Mental Status: She is alert and oriented to person, place, and time.     Coordination: Coordination normal.  Psychiatric:        Behavior: Behavior normal. Behavior is cooperative.        Thought Content: Thought  content normal.        Judgment: Judgment normal.         Patient has been counseled extensively about nutrition and exercise as well as the importance of adherence with medications and regular follow-up. The patient was given clear instructions to go to ER or return to medical center if symptoms don't improve, worsen or new problems develop. The patient verbalized understanding.   Follow-up: Return in about 3 months (around 10/30/2021).   [name], FNP-BC [facility], [place]   07/30/2021, 7:44 PM

## 2021-07-31 ENCOUNTER — Other Ambulatory Visit: Payer: Self-pay

## 2021-07-31 LAB — CMP14+EGFR
ALT: 13 IU/L (ref 0–32)
AST: 24 IU/L (ref 0–40)
Albumin/Globulin Ratio: 1.3 (ref 1.2–2.2)
Albumin: 4.4 g/dL (ref 3.8–4.9)
Alkaline Phosphatase: 166 IU/L — ABNORMAL HIGH (ref 44–121)
BUN/Creatinine Ratio: 13 (ref 9–23)
BUN: 15 mg/dL (ref 6–24)
Bilirubin Total: 0.5 mg/dL (ref 0.0–1.2)
CO2: 26 mmol/L (ref 20–29)
Calcium: 9.7 mg/dL (ref 8.7–10.2)
Chloride: 99 mmol/L (ref 96–106)
Creatinine, Ser: 1.15 mg/dL — ABNORMAL HIGH (ref 0.57–1.00)
Globulin, Total: 3.5 g/dL (ref 1.5–4.5)
Glucose: 115 mg/dL — ABNORMAL HIGH (ref 70–99)
Potassium: 3.6 mmol/L (ref 3.5–5.2)
Sodium: 141 mmol/L (ref 134–144)
Total Protein: 7.9 g/dL (ref 6.0–8.5)
eGFR: 57 mL/min/{1.73_m2} — ABNORMAL LOW (ref >59)

## 2021-07-31 LAB — CBC
Hematocrit: 42.1 % (ref 34.0–46.6)
Hemoglobin: 12.9 g/dL (ref 11.1–15.9)
MCH: 24.9 pg — ABNORMAL LOW (ref 26.6–33.0)
MCHC: 30.6 g/dL — ABNORMAL LOW (ref 31.5–35.7)
MCV: 81 fL (ref 79–97)
Platelets: 239 10*3/uL (ref 150–450)
RBC: 5.18 x10E6/uL (ref 3.77–5.28)
RDW: 15.7 % — ABNORMAL HIGH (ref 11.7–15.4)
WBC: 5.5 10*3/uL (ref 3.4–10.8)

## 2021-08-01 ENCOUNTER — Other Ambulatory Visit: Payer: Self-pay

## 2021-08-05 ENCOUNTER — Telehealth: Payer: Self-pay

## 2021-08-05 NOTE — Telephone Encounter (Signed)
-----   Message from [name], NP sent at 08/01/2021  8:22 AM EDT ----- CBC showing anemia which could be related to the blood thinner that you are currently taking. I will repeat your labs at your next office visit. If continue low will need to start iron supplement.  Kidney function/creatinine slightly increased. Will need to repeat [place]. Make sure to stay hydrated and avoid foods that are high in sodium or salt

## 2021-08-13 DIAGNOSIS — J849 Interstitial pulmonary disease, unspecified: Secondary | ICD-10-CM | POA: Diagnosis not present

## 2021-08-13 DIAGNOSIS — I2781 Cor pulmonale (chronic): Secondary | ICD-10-CM | POA: Diagnosis not present

## 2021-08-15 ENCOUNTER — Telehealth: Payer: Self-pay

## 2021-08-15 ENCOUNTER — Other Ambulatory Visit: Payer: Self-pay

## 2021-08-15 NOTE — Telephone Encounter (Signed)
PA FOR RISPERIDONE APPROVED UNTIL 08/15/22

## 2021-08-20 ENCOUNTER — Other Ambulatory Visit: Payer: Self-pay

## 2021-08-20 ENCOUNTER — Other Ambulatory Visit: Payer: Self-pay | Admitting: Nurse Practitioner

## 2021-08-20 ENCOUNTER — Ambulatory Visit: Payer: Medicaid Other | Attending: Nurse Practitioner

## 2021-08-20 DIAGNOSIS — D649 Anemia, unspecified: Secondary | ICD-10-CM | POA: Diagnosis not present

## 2021-08-20 DIAGNOSIS — R7989 Other specified abnormal findings of blood chemistry: Secondary | ICD-10-CM | POA: Diagnosis not present

## 2021-08-20 NOTE — Progress Notes (Signed)
b

## 2021-08-21 LAB — IRON,TIBC AND FERRITIN PANEL
Ferritin: 117 ng/mL (ref 15–150)
Iron Saturation: 12 % — ABNORMAL LOW (ref 15–55)
Iron: 41 ug/dL (ref 27–159)
Total Iron Binding Capacity: 329 ug/dL (ref 250–450)
UIBC: 288 ug/dL (ref 131–425)

## 2021-08-21 LAB — BASIC METABOLIC PANEL
BUN/Creatinine Ratio: 13 (ref 9–23)
BUN: 14 mg/dL (ref 6–24)
CO2: 28 mmol/L (ref 20–29)
Calcium: 9.3 mg/dL (ref 8.7–10.2)
Chloride: 97 mmol/L (ref 96–106)
Creatinine, Ser: 1.05 mg/dL — ABNORMAL HIGH (ref 0.57–1.00)
Glucose: 136 mg/dL — ABNORMAL HIGH (ref 70–99)
Potassium: 3.7 mmol/L (ref 3.5–5.2)
Sodium: 137 mmol/L (ref 134–144)
eGFR: 64 mL/min/{1.73_m2} (ref >59)

## 2021-08-21 LAB — CBC
Hematocrit: 39.8 % (ref 34.0–46.6)
Hemoglobin: 12.5 g/dL (ref 11.1–15.9)
MCH: 26.3 pg — ABNORMAL LOW (ref 26.6–33.0)
MCHC: 31.4 g/dL — ABNORMAL LOW (ref 31.5–35.7)
MCV: 84 fL (ref 79–97)
Platelets: 200 10*3/uL (ref 150–450)
RBC: 4.75 x10E6/uL (ref 3.77–5.28)
RDW: 16 % — ABNORMAL HIGH (ref 11.7–15.4)
WBC: 5.2 10*3/uL (ref 3.4–10.8)

## 2021-08-25 ENCOUNTER — Telehealth: Payer: Self-pay

## 2021-08-25 ENCOUNTER — Other Ambulatory Visit: Payer: Self-pay

## 2021-08-25 NOTE — Telephone Encounter (Signed)
-----   Message from [name], NP sent at 08/25/2021  8:19 AM EST ----- Labs do show slight  anemia at this time. Would recommend Multi vitamin daily with iron. Kidney function improved. Will recheck at next office visit.   Patient does not usually read mychart.

## 2021-08-25 NOTE — Telephone Encounter (Signed)
Left a vm to return call to the office.

## 2021-08-28 ENCOUNTER — Other Ambulatory Visit: Payer: Self-pay

## 2021-09-08 ENCOUNTER — Other Ambulatory Visit: Payer: Self-pay

## 2021-09-08 ENCOUNTER — Other Ambulatory Visit: Payer: Self-pay | Admitting: Pharmacist

## 2021-09-08 MED ORDER — TORSEMIDE 20 MG PO TABS
80.0000 mg | ORAL_TABLET | Freq: Two times a day (BID) | ORAL | 1 refills | Status: DC
  Filled 2021-09-08: qty 240, 30d supply, fill #0
  Filled 2021-10-15: qty 240, 30d supply, fill #1
  Filled 2021-10-15: qty 240, 30d supply, fill #0

## 2021-09-08 NOTE — Progress Notes (Signed)
Sending as 20 mg tablets as the pharmacy is unable to get 40 mg generic tablets and insurance will not cover brand Demadex.

## 2021-09-12 ENCOUNTER — Other Ambulatory Visit: Payer: Self-pay

## 2021-09-12 DIAGNOSIS — J849 Interstitial pulmonary disease, unspecified: Secondary | ICD-10-CM | POA: Diagnosis not present

## 2021-09-12 DIAGNOSIS — I2781 Cor pulmonale (chronic): Secondary | ICD-10-CM | POA: Diagnosis not present

## 2021-09-15 ENCOUNTER — Other Ambulatory Visit: Payer: Self-pay

## 2021-10-13 DIAGNOSIS — J849 Interstitial pulmonary disease, unspecified: Secondary | ICD-10-CM | POA: Diagnosis not present

## 2021-10-13 DIAGNOSIS — I2781 Cor pulmonale (chronic): Secondary | ICD-10-CM | POA: Diagnosis not present

## 2021-10-15 ENCOUNTER — Other Ambulatory Visit: Payer: Self-pay

## 2021-10-16 ENCOUNTER — Other Ambulatory Visit: Payer: Self-pay

## 2021-11-10 ENCOUNTER — Emergency Department (HOSPITAL_COMMUNITY): Payer: Medicaid Other

## 2021-11-10 ENCOUNTER — Inpatient Hospital Stay (HOSPITAL_COMMUNITY)
Admission: EM | Admit: 2021-11-10 | Discharge: 2021-11-15 | DRG: 291 | Disposition: A | Discharge status: Home / Self Care | Payer: Medicaid Other | Attending: Internal Medicine | Admitting: Internal Medicine

## 2021-11-10 DIAGNOSIS — R0602 Shortness of breath: Secondary | ICD-10-CM

## 2021-11-10 DIAGNOSIS — I517 Cardiomegaly: Secondary | ICD-10-CM | POA: Diagnosis not present

## 2021-11-10 DIAGNOSIS — I422 Other hypertrophic cardiomyopathy: Secondary | ICD-10-CM | POA: Diagnosis present

## 2021-11-10 DIAGNOSIS — Z91199 Patient's noncompliance with other medical treatment and regimen due to unspecified reason: Secondary | ICD-10-CM | POA: Diagnosis not present

## 2021-11-10 DIAGNOSIS — I252 Old myocardial infarction: Secondary | ICD-10-CM | POA: Diagnosis not present

## 2021-11-10 DIAGNOSIS — E119 Type 2 diabetes mellitus without complications: Secondary | ICD-10-CM | POA: Diagnosis not present

## 2021-11-10 DIAGNOSIS — I110 Hypertensive heart disease with heart failure: Principal | ICD-10-CM | POA: Diagnosis present

## 2021-11-10 DIAGNOSIS — I119 Hypertensive heart disease without heart failure: Secondary | ICD-10-CM | POA: Diagnosis present

## 2021-11-10 DIAGNOSIS — Z823 Family history of stroke: Secondary | ICD-10-CM

## 2021-11-10 DIAGNOSIS — I071 Rheumatic tricuspid insufficiency: Secondary | ICD-10-CM | POA: Diagnosis not present

## 2021-11-10 DIAGNOSIS — I5082 Biventricular heart failure: Secondary | ICD-10-CM | POA: Diagnosis present

## 2021-11-10 DIAGNOSIS — G4733 Obstructive sleep apnea (adult) (pediatric): Secondary | ICD-10-CM | POA: Diagnosis present

## 2021-11-10 DIAGNOSIS — E079 Disorder of thyroid, unspecified: Secondary | ICD-10-CM | POA: Diagnosis not present

## 2021-11-10 DIAGNOSIS — Z20822 Contact with and (suspected) exposure to covid-19: Secondary | ICD-10-CM | POA: Diagnosis not present

## 2021-11-10 DIAGNOSIS — N179 Acute kidney failure, unspecified: Secondary | ICD-10-CM | POA: Diagnosis present

## 2021-11-10 DIAGNOSIS — Z6841 Body Mass Index (BMI) 40.0 and over, adult: Secondary | ICD-10-CM

## 2021-11-10 DIAGNOSIS — F319 Bipolar disorder, unspecified: Secondary | ICD-10-CM | POA: Diagnosis present

## 2021-11-10 DIAGNOSIS — I2729 Other secondary pulmonary hypertension: Secondary | ICD-10-CM | POA: Diagnosis not present

## 2021-11-10 DIAGNOSIS — Z888 Allergy status to other drugs, medicaments and biological substances status: Secondary | ICD-10-CM

## 2021-11-10 DIAGNOSIS — Z7901 Long term (current) use of anticoagulants: Secondary | ICD-10-CM

## 2021-11-10 DIAGNOSIS — I5033 Acute on chronic diastolic (congestive) heart failure: Secondary | ICD-10-CM | POA: Diagnosis not present

## 2021-11-10 DIAGNOSIS — R918 Other nonspecific abnormal finding of lung field: Secondary | ICD-10-CM | POA: Diagnosis not present

## 2021-11-10 DIAGNOSIS — E1165 Type 2 diabetes mellitus with hyperglycemia: Secondary | ICD-10-CM

## 2021-11-10 DIAGNOSIS — Z79899 Other long term (current) drug therapy: Secondary | ICD-10-CM | POA: Diagnosis not present

## 2021-11-10 DIAGNOSIS — R079 Chest pain, unspecified: Secondary | ICD-10-CM | POA: Diagnosis not present

## 2021-11-10 DIAGNOSIS — E662 Morbid (severe) obesity with alveolar hypoventilation: Secondary | ICD-10-CM | POA: Diagnosis not present

## 2021-11-10 DIAGNOSIS — R609 Edema, unspecified: Secondary | ICD-10-CM | POA: Diagnosis not present

## 2021-11-10 DIAGNOSIS — I5031 Acute diastolic (congestive) heart failure: Secondary | ICD-10-CM | POA: Diagnosis not present

## 2021-11-10 DIAGNOSIS — I4819 Other persistent atrial fibrillation: Secondary | ICD-10-CM | POA: Diagnosis present

## 2021-11-10 DIAGNOSIS — R0789 Other chest pain: Secondary | ICD-10-CM | POA: Diagnosis not present

## 2021-11-10 DIAGNOSIS — Z8249 Family history of ischemic heart disease and other diseases of the circulatory system: Secondary | ICD-10-CM | POA: Diagnosis not present

## 2021-11-10 DIAGNOSIS — R000 Tachycardia, unspecified: Secondary | ICD-10-CM | POA: Diagnosis not present

## 2021-11-10 DIAGNOSIS — E66813 Obesity, class 3: Secondary | ICD-10-CM

## 2021-11-10 DIAGNOSIS — Z7984 Long term (current) use of oral hypoglycemic drugs: Secondary | ICD-10-CM | POA: Diagnosis not present

## 2021-11-10 DIAGNOSIS — I5032 Chronic diastolic (congestive) heart failure: Secondary | ICD-10-CM

## 2021-11-10 DIAGNOSIS — J811 Chronic pulmonary edema: Secondary | ICD-10-CM | POA: Diagnosis not present

## 2021-11-10 LAB — COMPREHENSIVE METABOLIC PANEL
ALT: 16 U/L (ref 0–44)
AST: 27 U/L (ref 15–41)
Albumin: 3.8 g/dL (ref 3.5–5.0)
Alkaline Phosphatase: 135 U/L — ABNORMAL HIGH (ref 38–126)
Anion gap: 10 (ref 5–15)
BUN: 15 mg/dL (ref 6–20)
CO2: 30 mmol/L (ref 22–32)
Calcium: 9.3 mg/dL (ref 8.9–10.3)
Chloride: 102 mmol/L (ref 98–111)
Creatinine, Ser: 0.94 mg/dL (ref 0.44–1.00)
GFR, Estimated: >60 mL/min (ref >60)
Glucose, Bld: 102 mg/dL — ABNORMAL HIGH (ref 70–99)
Potassium: 3.3 mmol/L — ABNORMAL LOW (ref 3.5–5.1)
Sodium: 142 mmol/L (ref 135–145)
Total Bilirubin: 0.7 mg/dL (ref 0.3–1.2)
Total Protein: 8.2 g/dL — ABNORMAL HIGH (ref 6.5–8.1)

## 2021-11-10 LAB — CBC WITH DIFFERENTIAL/PLATELET
Abs Immature Granulocytes: 0.02 10*3/uL (ref 0.00–0.07)
Basophils Absolute: 0 10*3/uL (ref 0.0–0.1)
Basophils Relative: 1 %
Eosinophils Absolute: 0.1 10*3/uL (ref 0.0–0.5)
Eosinophils Relative: 2 %
HCT: 38.3 % (ref 36.0–46.0)
Hemoglobin: 11.9 g/dL — ABNORMAL LOW (ref 12.0–15.0)
Immature Granulocytes: 1 %
Lymphocytes Relative: 22 %
Lymphs Abs: 0.9 10*3/uL (ref 0.7–4.0)
MCH: 26.9 pg (ref 26.0–34.0)
MCHC: 31.1 g/dL (ref 30.0–36.0)
MCV: 86.7 fL (ref 80.0–100.0)
Monocytes Absolute: 0.4 10*3/uL (ref 0.1–1.0)
Monocytes Relative: 11 %
Neutro Abs: 2.7 10*3/uL (ref 1.7–7.7)
Neutrophils Relative %: 63 %
Platelets: 208 10*3/uL (ref 150–400)
RBC: 4.42 MIL/uL (ref 3.87–5.11)
RDW: 15.9 % — ABNORMAL HIGH (ref 11.5–15.5)
WBC: 4.2 10*3/uL (ref 4.0–10.5)
nRBC: 0 % (ref 0.0–0.2)

## 2021-11-10 LAB — CBG MONITORING, ED: Glucose-Capillary: 133 mg/dL — ABNORMAL HIGH (ref 70–99)

## 2021-11-10 LAB — HEMOGLOBIN A1C
Hgb A1c MFr Bld: 6.3 % — ABNORMAL HIGH (ref 4.8–5.6)
Mean Plasma Glucose: 134.11 mg/dL

## 2021-11-10 LAB — MAGNESIUM: Magnesium: 2.2 mg/dL (ref 1.7–2.4)

## 2021-11-10 LAB — TROPONIN I (HIGH SENSITIVITY)
Troponin I (High Sensitivity): 5 ng/L (ref <18)
Troponin I (High Sensitivity): 5 ng/L (ref <18)

## 2021-11-10 LAB — TSH: TSH: 5.523 u[IU]/mL — ABNORMAL HIGH (ref 0.350–4.500)

## 2021-11-10 LAB — BRAIN NATRIURETIC PEPTIDE: B Natriuretic Peptide: 220.9 pg/mL — ABNORMAL HIGH (ref 0.0–100.0)

## 2021-11-10 MED ORDER — INSULIN ASPART 100 UNIT/ML IJ SOLN: 0.0000 [IU] | Freq: Every day | INTRAMUSCULAR | Status: DC

## 2021-11-10 MED ORDER — FUROSEMIDE 10 MG/ML IJ SOLN
80.0000 mg | Freq: Once | INTRAMUSCULAR | Status: AC
  Administered 2021-11-10: 80 mg via INTRAVENOUS
  Filled 2021-11-10: qty 8

## 2021-11-10 MED ORDER — LOSARTAN POTASSIUM 25 MG PO TABS
25.0000 mg | ORAL_TABLET | Freq: Every day | ORAL | Status: DC
Start: 2021-11-10 — End: 2021-11-15
  Administered 2021-11-10 – 2021-11-15 (×6): 25 mg via ORAL
  Filled 2021-11-10 (×6): qty 1

## 2021-11-10 MED ORDER — INSULIN ASPART 100 UNIT/ML IJ SOLN
0.0000 [IU] | Freq: Three times a day (TID) | INTRAMUSCULAR | Status: DC
  Administered 2021-11-11: 2 [IU] via SUBCUTANEOUS
  Administered 2021-11-11 – 2021-11-12 (×2): 1 [IU] via SUBCUTANEOUS
  Administered 2021-11-12: 2 [IU] via SUBCUTANEOUS
  Administered 2021-11-12: 1 [IU] via SUBCUTANEOUS

## 2021-11-10 MED ORDER — SODIUM CHLORIDE 0.9 % IV SOLN: 250.0000 mL | INTRAVENOUS | Status: DC | PRN

## 2021-11-10 MED ORDER — AMLODIPINE BESYLATE 5 MG PO TABS: 10.0000 mg | ORAL_TABLET | Freq: Every day | ORAL | Status: DC

## 2021-11-10 MED ORDER — POTASSIUM CHLORIDE CRYS ER 20 MEQ PO TBCR
20.0000 meq | EXTENDED_RELEASE_TABLET | Freq: Every day | ORAL | Status: DC
  Administered 2021-11-11 – 2021-11-15 (×5): 20 meq via ORAL
  Filled 2021-11-10 (×5): qty 1

## 2021-11-10 MED ORDER — ONDANSETRON HCL 4 MG/2ML IJ SOLN: 4.0000 mg | Freq: Four times a day (QID) | INTRAMUSCULAR | Status: DC | PRN

## 2021-11-10 MED ORDER — HYDRALAZINE HCL 25 MG PO TABS
25.0000 mg | ORAL_TABLET | Freq: Three times a day (TID) | ORAL | Status: DC
  Administered 2021-11-10 – 2021-11-15 (×14): 25 mg via ORAL
  Filled 2021-11-10 (×16): qty 1

## 2021-11-10 MED ORDER — FUROSEMIDE 10 MG/ML IJ SOLN
80.0000 mg | Freq: Two times a day (BID) | INTRAMUSCULAR | Status: DC
  Administered 2021-11-11 – 2021-11-15 (×9): 80 mg via INTRAVENOUS
  Filled 2021-11-10 (×9): qty 8

## 2021-11-10 MED ORDER — SODIUM CHLORIDE 0.9% FLUSH: 3.0000 mL | INTRAVENOUS | Status: DC | PRN

## 2021-11-10 MED ORDER — AMLODIPINE BESYLATE 10 MG PO TABS
10.0000 mg | ORAL_TABLET | Freq: Every day | ORAL | Status: DC
  Administered 2021-11-11 – 2021-11-15 (×5): 10 mg via ORAL
  Filled 2021-11-10 (×4): qty 1
  Filled 2021-11-10: qty 2

## 2021-11-10 MED ORDER — SODIUM CHLORIDE 0.9% FLUSH
3.0000 mL | Freq: Two times a day (BID) | INTRAVENOUS | Status: DC
  Administered 2021-11-10 – 2021-11-15 (×9): 3 mL via INTRAVENOUS

## 2021-11-10 MED ORDER — POTASSIUM CHLORIDE CRYS ER 20 MEQ PO TBCR: 20.0000 meq | EXTENDED_RELEASE_TABLET | Freq: Every day | ORAL | Status: DC

## 2021-11-10 MED ORDER — RISPERIDONE 1 MG PO TABS
4.0000 mg | ORAL_TABLET | Freq: Every day | ORAL | Status: DC
  Administered 2021-11-11 – 2021-11-15 (×5): 4 mg via ORAL
  Filled 2021-11-10: qty 4
  Filled 2021-11-10: qty 2
  Filled 2021-11-10 (×2): qty 4
  Filled 2021-11-10: qty 2
  Filled 2021-11-10: qty 4

## 2021-11-10 MED ORDER — CARVEDILOL 25 MG PO TABS
25.0000 mg | ORAL_TABLET | Freq: Two times a day (BID) | ORAL | Status: DC
  Administered 2021-11-11 – 2021-11-15 (×10): 25 mg via ORAL
  Filled 2021-11-10 (×5): qty 1
  Filled 2021-11-10: qty 8
  Filled 2021-11-10 (×4): qty 1

## 2021-11-10 MED ORDER — EMPAGLIFLOZIN 10 MG PO TABS
10.0000 mg | ORAL_TABLET | Freq: Every day | ORAL | Status: DC
  Administered 2021-11-11 – 2021-11-15 (×5): 10 mg via ORAL
  Filled 2021-11-10 (×5): qty 1

## 2021-11-10 MED ORDER — POTASSIUM CHLORIDE CRYS ER 20 MEQ PO TBCR
40.0000 meq | EXTENDED_RELEASE_TABLET | Freq: Once | ORAL | Status: AC
Start: 2021-11-10 — End: 2021-11-10
  Administered 2021-11-10: 40 meq via ORAL
  Filled 2021-11-10: qty 2

## 2021-11-10 MED ORDER — ASPIRIN EC 81 MG PO TBEC
81.0000 mg | DELAYED_RELEASE_TABLET | Freq: Every day | ORAL | Status: DC
  Administered 2021-11-10 – 2021-11-15 (×6): 81 mg via ORAL
  Filled 2021-11-10 (×6): qty 1

## 2021-11-10 NOTE — ED Triage Notes (Signed)
Pt BIB [facility] from home after calling for SOB and swelling x 1 week. EMS reports patient outwalked them to the unit and only dropped to 89% on room air. Patient ambulatory and alert at this time and arrives for evaluation of ongoing symptoms. NAD BP 122/76

## 2021-11-10 NOTE — ED Notes (Signed)
Pt ambulated about 50 ft unassisted and maintained an spo2 of 93%.

## 2021-11-10 NOTE — Consult Note (Addendum)
Cardiology Consultation:   Patient ID: [name] MRN: [id number]; DOB: 1966/01/07  Admit date: 11/10/2021 Date of Consult: 11/10/2021  PCP:  [name], NP   [facility] Providers Cardiologist:  [name], MD  Cardiology APP:  Deberah Pelton, NP       Patient Profile:   [name] is a 56 y.o. 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 Gustavus Messing.  History of Present Illness:   [name] with hx as above with last admit for CHF 03/2021 and no follow up since that time.  She now presents with chest pain and SOB with increasing edema.  SOB began 1 week ago.  She has not done anything different.  No increase of salt no fever, now some constipation along with SOB. Has to sit up at night to sleep.  She has not missed any meds but could not tell me why she did not have follow up after last hospitalization.  She did note she felt fine until this past week.  She does not wear her CPAP. Has not missed any xarelto.   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  Courses between PA and AO.  Mild dynamic compression is noted.  Did have VFib wit heart cath.    Last echo 04/18/21 with EF 60-65%, no RWMA, severely LVH RV normal with moderately elevated PA systolic pressure, Lt atrial size severely dilated and RA mildly dilated.  Mid MR mod to severe TR   Numerous attempts to DCCV to SR but all unsuccessful.   BP on arrival 143/85 to 143/103 P 80s afebrile.  Na 142 K+ 3.3 BUN 15 Cr 0.94 alk phos 135  BNP 220 Hs troponin 5 and 5 Hgb 11.9 WBC 4.2 plts 208.   2V CXR  FINDINGS: Unchanged enlarged cardiac silhouette. There are diffuse interstitial and lower lung predominant alveolar  opacities. No large pleural effusion. No visible pneumothorax. No acute osseous abnormality.  IMPRESSION: Cardiomegaly with moderate pulmonary edema   EKG:  The EKG was personally reviewed and demonstrates:  atrial fib with rate controlled RAD, no acute ST changes Telemetry:  Telemetry was personally reviewed and demonstrates:  atrial fib rate around 100 She has rec'd lasix 80 mg once just now.   She initially said she was taking 80 of torsemide BID but changed to 40 mg BID.  She has had some bloating but she did have her menses today.  Past Medical History:  Diagnosis Date   Acute on chronic diastolic congestive heart failure ([place]) 11/02/2013   10/03/2015, 11/13/2015, 08/03/2017   Benign essential HTN 11/28/2013   Bipolar disease, chronic ([place])    Chest pain    a. 2012 Myoview: EF 63%, no isch/infarct;  b. 04/2016 Lexiscan MV: EF 73%, no ischemia/infarct-->Low risk.   Chronic diastolic CHF (congestive heart failure) ([place]) 07/23/2011   a. 2015 Echo: EF 55-60%, Gr2 DD;  b. 09/2015 Echo: EF 60-65%, no rwma, mod dil LA, PASP 41mHg.   Cor pulmonale (chronic) ([facility])    History of thyrotoxicosis    HTN (hypertension) 11/28/2013   Hypertensive heart disease 10/18/2013   Hypoglycemia    Insulin dependent type 2 diabetes mellitus, uncontrolled    Mediastinal  adenopathy    Morbid obesity due to excess calories ([place]) 02/19/2011   Morbid obesity with BMI of 50.0-59.9, adult ([facility])    OSA (obstructive sleep apnea) 03/06/2011   Persistent atrial fibrillation ([place]) 12/09/2017   Pulmonary HTN, moderate to severe 11/03/2013   Sinusitis, chronic 01/02/2015   SVT (supraventricular tachycardia) ([place]) 12/06/2013   Uncontrolled type 2 diabetes mellitus with hyperglycemia [facility])     Past Surgical History:  Procedure Laterality Date   CARDIOVERSION N/A 04/05/2018   Procedure: CARDIOVERSION;  Surgeon: Lelon Perla, MD;  Location: [facility];  Service: Cardiovascular;  Laterality: N/A;   COLONOSCOPY WITH  PROPOFOL Left 07/16/2018   Procedure: COLONOSCOPY WITH PROPOFOL;  Surgeon: Ronnette Juniper, MD;  Location: [facility];  Service: Gastroenterology;  Laterality: Left;   LEFT HEART CATH AND CORONARY ANGIOGRAPHY N/A 08/04/2018   Procedure: LEFT HEART CATH AND CORONARY ANGIOGRAPHY;  Surgeon: Belva Crome, MD;  Location: [facility];  Service: Cardiovascular;  Laterality: N/A;   None     POLYPECTOMY  07/16/2018   Procedure: POLYPECTOMY;  Surgeon: Ronnette Juniper, MD;  Location: [facility];  Service: Gastroenterology;;     Home Medications:  Prior to Admission medications   Medication Sig Start Date End Date Taking? Authorizing Provider  amLODipine (NORVASC) 10 MG tablet Take 1 tablet (10 mg total) by mouth daily. Please fill as a 90 day supply 07/30/21 11/10/21 Yes [name], NP  carvedilol (COREG) 25 MG tablet Take 1 tablet (25 mg total) by mouth 2 (two) times daily with a meal. Please fill as a 90 day supply 07/30/21 11/10/21 Yes [name], NP  Dulaglutide 1.5 MG/0.5ML SOPN INJECT 1.5 MG INTO THE SKIN ONCE A WEEK. Patient taking differently: Inject 1.5 mg into the skin every Friday. 07/30/21 01/08/22 Yes [name], NP  empagliflozin (JARDIANCE) 10 MG TABS tablet Take 1 tablet (10 mg total) by mouth daily. Please fill as a 90 day supply 07/30/21 11/26/21 Yes [name], NP  glipiZIDE (GLUCOTROL XL) 10 MG 24 hr tablet Take 1 tablet (10 mg total) by mouth daily with breakfast. Please fill as a 90 day supply 07/30/21 11/10/21 Yes [name], NP  hydrALAZINE (APRESOLINE) 25 MG tablet Take 1 tablet (25 mg total) by mouth every 8 (eight) hours. Please fill as a 90 day supply 07/30/21 11/10/21 Yes [name], NP  losartan (COZAAR) 25 MG tablet Take 1 tablet (25 mg total) by mouth daily. Please fill as a 90 day supply 07/30/21 11/10/21 Yes [name], NP  metFORMIN (GLUCOPHAGE) 1000 MG tablet Take 1 tablet (1,000 mg total) by mouth 2 (two) times daily with a meal. Please  fill as a 90 day supply 07/30/21 11/10/21 Yes [name], NP  Naphazoline-Pheniramine (EYE ALLERGY RELIEF OP) Place 1 drop into both eyes daily as needed (allergy).   Yes [provider]  risperidone (RISPERDAL) 4 MG tablet Take 1 tablet (4 mg total) by mouth daily. Please fill as a 90 day supply 07/30/21 11/10/21 Yes [name], NP  rivaroxaban (XARELTO) 20 MG TABS tablet Take 1 tablet (20 mg total) by mouth daily with supper. Please fill as a 90 day supply 07/30/21 12/14/21 Yes [name], NP  torsemide (DEMADEX) 20 MG tablet Take 4 tablets (80 mg total) by mouth 2 (two) times daily. 09/08/21  Yes Charlott Rakes, MD  Blood Glucose Monitoring Suppl (ACCU-CHEK GUIDE) w/Device KIT Use to check blood sugar three times daily. 07/30/21  Charlott Rakes, MD  Blood Pressure Monitor DEVI Please provide patient with insurance approved blood pressure monitor I10.0 07/21/20   [name], NP  escitalopram (LEXAPRO) 20 MG tablet Take 1 tablet (20 mg total) by mouth every morning. Please fill as a 90 day supply Patient not taking: Reported on 11/10/2021 07/30/21 10/28/21  [name], NP  glucose blood test strip USE AS DIRECTED TO TEST BLOOD SUGAR THREE TIMES DAILY 07/30/21 07/30/22  [name], NP  hydrOXYzine (ATARAX/VISTARIL) 25 MG tablet Take 1 tablet (25 mg total) by mouth 3 (three) times daily as needed for itching. Patient not taking: Reported on 11/10/2021 07/30/21   [name], NP  Incontinence Supply Disposable (INCONTINENCE BRIEF LARGE) MISC Please provide patient with insurance approved incontinence supplies/briefs 07/02/20   [name], NP  Misc. Devices MISC Please provide BiPAP machine with the following: Settings 22/18 cm H2O. Medium  size [employer] Full Face Mask Simplus mask and heated  humidification. 02/28/20   [name], NP  Misc. Devices MISC Please provide patient a Blood Pressure Monitor w/ insurance approval. 07/18/20   [name], NP  solifenacin (VESICARE) 10 MG tablet Take 1 tablet (10 mg total) by mouth daily. Please fill as a 90 day supply Patient not taking: Reported on 11/10/2021 07/30/21 11/26/21  [name], NP  furosemide (LASIX) 80 MG tablet Take 1 tablet (80 mg total) by mouth in the morning, at noon, and at bedtime. 12/19/19 12/30/19  Allie Bossier, MD  sitaGLIPtin (JANUVIA) 100 MG tablet Take 1 tablet (100 mg total) by mouth daily. 10/02/20 10/24/20  [name], NP    Inpatient Medications: Scheduled Meds:  Continuous Infusions:  PRN Meds:   Allergies:    Allergies  Allergen Reactions   Acetaminophen Other (See Comments)    Seizure-like "fits" as a child   Caffeine     Tense, anxiety, increased urination   [place] [Dapagliflozin] Other (See Comments)    Hallucinations, drop in blood sugar   Lisinopril Rash    Rash with lisinopril; but fosinopril is ok per patient    Social History:   Social History   Socioeconomic History   Marital status: Single    Spouse name: Not on file   Number of children: 0   Years of education: 18   Highest education level: Not on file  Occupational History   Occupation: unemployed  Tobacco Use   Smoking status: Never   Smokeless tobacco: Never  Vaping Use   Vaping Use: Never used  Substance and Sexual Activity   Alcohol use: No   Drug use: No   Sexual activity: Not Currently    Birth control/protection: None  Other Topics Concern   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 But ran out of money and is no longer attending school. (Note patient has bipolar disorder).      Born in [place] but lived in [place] before coming back to [place].       Primary language is Arabic. Lives with mother and brother.   Social Determinants of Health   Financial Resource Strain: Not on file  Food Insecurity: Not on file  Transportation Needs: Not on  file  Physical Activity: Not on file  Stress: Not on file  Social Connections: Not on file  Intimate Partner  Violence: Not on file    Family History:    Family History  Problem Relation Age of Onset   Heart failure Father    Stroke Father    Hypertension Mother    Heart disease Maternal Grandfather      ROS:  Please see the history of present illness.  General:no colds or fevers, no weight changes Skin:no rashes or ulcers HEENT:no blurred vision, no congestion CV:see HPI PUL:see HPI GI:no diarrhea +constipation no melena, no indigestion GU:no hematuria, no dysuria MS:no joint pain, no claudication Neuro:no syncope, no lightheadedness Endo:+ diabetes, no thyroid disease hx of thyrotoxicosis Psych: bipolar disease.  All other ROS reviewed and negative.     Physical Exam/Data:   Vitals:   11/10/21 1730 11/10/21 1745 11/10/21 1800 11/10/21 1830  BP: (!) 150/104  (!) 138/101   Pulse: 92 95 87 96  Resp: (!) 22 (!) 23 15 (!) 24  Temp:      TempSrc:      SpO2: 97% 100% 97% 99%   No intake or output data in the 24 hours ending 11/10/21 1900 Last 3 Weights 07/30/2021 06/17/2021 04/23/2021  Weight (lbs) 328 lb 4 oz 336 lb 351 lb 8 oz  Weight (kg) 148.893 kg 152.409 kg 159.439 kg     There is no height or weight on file to calculate BMI.  General:  Well nourished, well developed, in no acute distress HEENT: normal Neck: no JVD Vascular: No carotid bruits; Distal pulses 2+ bilaterally Cardiac:  normal S1, S2; RRR; no murmur gallup rub or click Lungs:  clear except rales in bases to auscultation bilaterally, no wheezing, rhonchi  Abd: soft, nontender, no hepatomegaly  Ext: +1-2 edema of lower ext Musculoskeletal:  No deformities, BUE and BLE strength normal and equal Skin: warm and dry  Neuro:  alert and oriented X 3 MAE follows commands, no focal abnormalities noted Psych:  Normal affect   Relevant CV Studies:  ECHO 04/18/21 IMPRESSIONS     1. Left ventricular  ejection fraction, by estimation, is 60 to 65%. The  left ventricle has normal function. The left ventricle has no regional  wall motion abnormalities. There is severe left ventricular hypertrophy.  Left ventricular diastolic function  could not be evaluated.   2. Right ventricular systolic function is normal. The right ventricular  size is normal. There is moderately elevated pulmonary artery systolic  pressure.   3. Left atrial size was severely dilated.   4. Right atrial size was mildly dilated.   5. The mitral valve is normal in structure. Mild mitral valve  regurgitation. No evidence of mitral stenosis.   6. Tricuspid valve regurgitation is moderate to severe.   7. The aortic valve is tricuspid. Aortic valve regurgitation is trivial.  No aortic stenosis is present.   8. The inferior vena cava is dilated in size with >50% respiratory  variability, suggesting right atrial pressure of 8 mmHg.   FINDINGS   Left Ventricle: Left ventricular ejection fraction, by estimation, is 60  to 65%. The left ventricle has normal function. The left ventricle has no  regional wall motion abnormalities. The left ventricular internal cavity  size was normal in size. There is   severe left ventricular hypertrophy. Left ventricular diastolic function  could not be evaluated due to atrial fibrillation. Left ventricular  diastolic function could not be evaluated.   Right Ventricle: The right ventricular size is normal. Right ventricular  systolic function is normal. There is moderately elevated pulmonary artery  systolic pressure. The tricuspid regurgitant velocity is 2.86 m/s, and  with an assumed right atrial  pressure of 15 mmHg, the estimated right ventricular systolic pressure is  11.5 mmHg.   Left Atrium: Left atrial size was severely dilated.   Right Atrium: Right atrial size was mildly dilated.   Pericardium: Trivial pericardial effusion is present.   Mitral Valve: The mitral valve is  normal in structure. Mild mitral annular  calcification. Mild mitral valve regurgitation. No evidence of mitral  valve stenosis.   Tricuspid Valve: The tricuspid valve is normal in structure. Tricuspid  valve regurgitation is moderate to severe. No evidence of tricuspid  stenosis.   Aortic Valve: The aortic valve is tricuspid. Aortic valve regurgitation is  trivial. No aortic stenosis is present.   Pulmonic Valve: The pulmonic valve was normal in structure. Pulmonic valve  regurgitation is not visualized. No evidence of pulmonic stenosis.   Aorta: The aortic root is normal in size and structure.   Venous: The inferior vena cava is dilated in size with greater than 50%  respiratory variability, suggesting right atrial pressure of 8 mmHg.   IAS/Shunts: No atrial level shunt detected by color flow Doppler.   ECHO: 12/09/19  1. Left ventricular ejection fraction, by estimation, is 65 to 70%. The  left ventricle has hyperdynamic function. The left ventricle has no  regional wall motion abnormalities. Left ventricular diastolic function  could not be evaluated. Left ventricular  diastolic function could not be evaluated.   2. Right ventricular systolic function is normal. The right ventricular  size is normal. There is moderately elevated pulmonary artery systolic  pressure. The estimated right ventricular systolic pressure is 72.6 mmHg.   3. Left atrial size was mildly dilated.   4. Right atrial size was mildly dilated.   5. The mitral valve is normal in structure. Mild mitral valve  regurgitation. No evidence of mitral stenosis.   6. The aortic valve is normal in structure. Aortic valve regurgitation is  not visualized. No aortic stenosis is present.   7. The inferior vena cava is normal in size with greater than 50%  respiratory variability, suggesting right atrial pressure of 3 mmHg.    CATH: 07/2018 Widely patent coronary arteries. Anomalous origin of right coronary from the  left sinus of Valsalva.  Courses between PA and AO.  Mild dynamic compression is noted. Hypercontractile left ventricle.  Marked elevation in LVEDP, 26 mmHg.  EF greater than 65%. Ventricular fibrillation treated with defibrillation x1. Conversion to normal sinus rhythm after defibrillation.   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 Resume anticoagulation, likely Xarelto since coronaries are clean. Further management per treating team. Discontinue nitroglycerin IV.  Laboratory Data:  High Sensitivity Troponin:   Recent Labs  Lab 11/10/21 1250 11/10/21 1546  TROPONINIHS 5 5     Chemistry Recent Labs  Lab 11/10/21 1250  NA 142  K 3.3*  CL 102  CO2 30  GLUCOSE 102*  BUN 15  CREATININE 0.94  CALCIUM 9.3  GFRNONAA >60  ANIONGAP 10    Recent Labs  Lab 11/10/21 1250  PROT 8.2*  ALBUMIN 3.8  AST 27  ALT 16  ALKPHOS 135*  BILITOT 0.7   Lipids No results for input(s): CHOL, TRIG, HDL, LABVLDL, LDLCALC, CHOLHDL in the last 168 hours.  Hematology Recent Labs  Lab 11/10/21 1250  WBC 4.2  RBC 4.42  HGB 11.9*  HCT 38.3  MCV 86.7  MCH 26.9  MCHC 31.1  RDW 15.9*  PLT 208   Thyroid No results for input(s): TSH, FREET4 in the last 168 hours.  BNP Recent Labs  Lab 11/10/21 1250  BNP 220.9*    DDimer No results for input(s): DDIMER in the last 168 hours.   Radiology/Studies:  DG Chest 2 View  Result Date: 11/10/2021 CLINICAL DATA:  Chest pain, shortness of breath EXAM: CHEST - 2 VIEW COMPARISON:  Radiograph 04/17/2021 FINDINGS: Unchanged enlarged cardiac silhouette. There are diffuse interstitial and lower lung predominant alveolar opacities. No large pleural effusion. No visible pneumothorax. No acute osseous abnormality. IMPRESSION: Cardiomegaly with moderate pulmonary edema. Electronically Signed   By: Maurine Simmering M.D.   On: 11/10/2021 13:20     Assessment and Plan:   Acute CHF- diastolic  has been on  torsemide 40 BID instead of 80 BID though when asked again stated 80 BID  .  Will need admit and diuresis. She is on jardiance hydralazine, cozaar and coreg for GDMT.   Will hold off on repeat echo for now, recent one in July 2022. Normal cors on cath 2019 with . 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  Courses between PA and AO.  Mild dynamic compression is noted .   HTN elevated currently.  Persistent atrial fib on coreg and xarelto rate is mildy elevated. Continue xarelto  OSA seems severe but she will not wear CPAP.  DM continue jardiance and SSI for now.   Risk Assessment/Risk Scores:        New York Heart Association (NYHA) Functional Class NYHA Class IV  CHA2DS2-VASc Score = 4   This indicates a 4.8% annual risk of stroke. The patient's score is based upon: CHF History: 1 HTN History: 1 Diabetes History: 1 Stroke History: 0 Vascular Disease History: 0 Age Score: 0 Gender Score: 1         For questions or updates, please contact [place] Please consult [email or web address] for contact info under    Signed, Cecilie Kicks, NP  11/10/2021 7:00 PM

## 2021-11-10 NOTE — ED Provider Notes (Signed)
[place] EMERGENCY DEPARTMENT Provider Note   CSN: [id number] Arrival date & time: 11/10/21  1201     History Chief Complaint  Patient presents with   Shortness of Breath   Chest Pain    [name] is a 56 y.o. female history of NSTEMI, diabetes, hypertension, CHF, and A-fib seen emergency department for evaluation of chest pain radiating to her left shoulder started this morning, but does not know exactly when.  She reports its been constant shocking pain as well as some heaviness.  She reports gradually worsening shortness of breath over the past few months but mainly over the past few days.  She reports gradual worsening leg swelling for the past week as well.  She is on torsemide 40 mg twice daily and reports compliancy with this medication.  She denies any weakness, bleeding, syncope, or lightheadedness.  Denies any palpitations, abdominal pain, nausea, or vomiting.  Per EMS, patient walk to the unit and dropped to 89% on room air.   Shortness of Breath Associated symptoms: chest pain   Associated symptoms: no abdominal pain, no cough, no fever and no vomiting   Chest Pain Associated symptoms: fatigue and shortness of breath   Associated symptoms: no abdominal pain, no cough, no fever, no nausea, no palpitations, no vomiting and no weakness       Home Medications Prior to Admission medications   Medication Sig Start Date End Date Taking? Authorizing Provider  amLODipine (NORVASC) 10 MG tablet Take 1 tablet (10 mg total) by mouth daily. Please fill as a 90 day supply 07/30/21 11/10/21 Yes [name], NP  carvedilol (COREG) 25 MG tablet Take 1 tablet (25 mg total) by mouth 2 (two) times daily with a meal. Please fill as a 90 day supply 07/30/21 11/10/21 Yes [name], NP  Dulaglutide 1.5 MG/0.5ML SOPN INJECT 1.5 MG INTO THE SKIN ONCE A WEEK. Patient taking differently: Inject 1.5 mg into the skin every Friday. 07/30/21 01/08/22 Yes [name], NP  empagliflozin (JARDIANCE) 10 MG TABS tablet Take 1 tablet (10 mg total) by mouth daily. Please fill as a 90 day supply 07/30/21 11/26/21 Yes [name], NP  glipiZIDE (GLUCOTROL XL) 10 MG 24 hr tablet Take 1 tablet (10 mg total) by mouth daily with breakfast. Please fill as a 90 day supply 07/30/21 11/10/21 Yes [name], NP  hydrALAZINE (APRESOLINE) 25 MG tablet Take 1 tablet (25 mg total) by mouth every 8 (eight) hours. Please fill as a 90 day supply 07/30/21 11/10/21 Yes [name], NP  losartan (COZAAR) 25 MG tablet Take 1 tablet (25 mg total) by mouth daily. Please fill as a 90 day supply 07/30/21 11/10/21 Yes [name], NP  metFORMIN (GLUCOPHAGE) 1000 MG tablet Take 1 tablet (1,000 mg total) by mouth 2 (two) times daily with a meal. Please fill as a 90 day supply 07/30/21 11/10/21 Yes [name], NP  Naphazoline-Pheniramine (EYE ALLERGY RELIEF OP) Place 1 drop into both eyes daily as needed (allergy).   Yes [provider]  risperidone (RISPERDAL) 4 MG tablet Take 1 tablet (4 mg total) by mouth daily. Please fill as a 90 day supply 07/30/21 11/10/21 Yes [name], NP  rivaroxaban (XARELTO) 20 MG TABS tablet Take 1 tablet (20 mg total) by mouth daily with supper. Please fill as a 90 day supply 07/30/21 12/14/21 Yes [name], NP  torsemide (DEMADEX) 20 MG tablet Take 4 tablets (80  mg total) by mouth 2 (two) times daily. 09/08/21  Yes Charlott Rakes, MD  Blood Glucose Monitoring Suppl (ACCU-CHEK GUIDE) w/Device KIT Use to check blood sugar three times daily. 07/30/21   Charlott Rakes, MD  Blood Pressure Monitor DEVI Please provide patient with insurance approved blood pressure monitor I10.0 07/21/20   [name], NP  escitalopram (LEXAPRO) 20 MG tablet Take 1 tablet (20 mg total) by mouth every morning. Please fill as a 90 day supply Patient not taking: Reported on 11/10/2021 07/30/21 10/28/21  [name], NP  glucose blood test strip USE AS  DIRECTED TO TEST BLOOD SUGAR THREE TIMES DAILY 07/30/21 07/30/22  [name], NP  hydrOXYzine (ATARAX/VISTARIL) 25 MG tablet Take 1 tablet (25 mg total) by mouth 3 (three) times daily as needed for itching. Patient not taking: Reported on 11/10/2021 07/30/21   [name], NP  Incontinence Supply Disposable (INCONTINENCE BRIEF LARGE) MISC Please provide patient with insurance approved incontinence supplies/briefs 07/02/20   [name], NP  Misc. Devices MISC Please provide BiPAP machine with the following: Settings 22/18 cm H2O. Medium  size [employer] Full Face Mask Simplus mask and heated  humidification. 02/28/20   [name], NP  Misc. Devices MISC Please provide patient a Blood Pressure Monitor w/ insurance approval. 07/18/20   [name], NP  solifenacin (VESICARE) 10 MG tablet Take 1 tablet (10 mg total) by mouth daily. Please fill as a 90 day supply Patient not taking: Reported on 11/10/2021 07/30/21 11/26/21  [name], NP  furosemide (LASIX) 80 MG tablet Take 1 tablet (80 mg total) by mouth in the morning, at noon, and at bedtime. 12/19/19 12/30/19  Allie Bossier, MD  sitaGLIPtin (JANUVIA) 100 MG tablet Take 1 tablet (100 mg total) by mouth daily. 10/02/20 10/24/20  [name], NP      Allergies    Acetaminophen, Caffeine, Farxiga [dapagliflozin], and Lisinopril    Review of Systems   Review of Systems  Constitutional:  Positive for fatigue. Negative for chills and fever.  Respiratory:  Positive for shortness of breath. Negative for cough.   Cardiovascular:  Positive for chest pain and leg swelling. Negative for palpitations.  Gastrointestinal:  Negative for abdominal pain, nausea and vomiting.  Neurological:  Negative for weakness and light-headedness.   Physical Exam Updated Vital Signs BP 137/90    Pulse 100    Temp 97.6 F (36.4 C) (Oral)    Resp 19    SpO2 98%  Physical Exam Vitals and nursing note reviewed.  Constitutional:      General:  She is not in acute distress.    Appearance: Normal appearance. She is obese. She is not ill-appearing or toxic-appearing.  HENT:     Head: Normocephalic and atraumatic.     Mouth/Throat:     Mouth: Mucous membranes are moist.  Eyes:     General: No scleral icterus. Cardiovascular:     Rate and Rhythm: Tachycardia present. Rhythm irregular.     Pulses: Normal pulses.  Pulmonary:     Effort: Pulmonary effort is normal. No respiratory distress.     Comments: Diminished breath sounds at the bases.  On my examination, the patient is not on any oxygen and is satting 96% in the room.  She is speaking in full sentences with ease.  No respiratory distress, assessor muscle use, tripoding, nasal flaring, or cyanosis present. Chest:     Chest wall: No tenderness.  Abdominal:  General: Abdomen is flat. Bowel sounds are normal.     Palpations: Abdomen is soft.  Musculoskeletal:        General: No deformity.     Cervical back: Normal range of motion.     Right lower leg: Edema present.     Left lower leg: Edema present.     Comments: 2+ pitting edema bilaterally  Skin:    General: Skin is warm and dry.  Neurological:     General: No focal deficit present.     Mental Status: She is alert. Mental status is at baseline.     Motor: No weakness.    ED Results / Procedures / Treatments   Labs (all labs ordered are listed, but only abnormal results are displayed) Labs Reviewed  BRAIN NATRIURETIC PEPTIDE - Abnormal; Notable for the following components:      Result Value   B Natriuretic Peptide 220.9 (*)    All other components within normal limits  COMPREHENSIVE METABOLIC PANEL - Abnormal; Notable for the following components:   Potassium 3.3 (*)    Glucose, Bld 102 (*)    Total Protein 8.2 (*)    Alkaline Phosphatase 135 (*)    All other components within normal limits  CBC WITH DIFFERENTIAL/PLATELET - Abnormal; Notable for the following components:   Hemoglobin 11.9 (*)    RDW 15.9  (*)    All other components within normal limits  SARS CORONAVIRUS 2 (TAT 6-24 HRS)  HEMOGLOBIN A1C  MAGNESIUM  TSH  HEMOGLOBIN A1C  TROPONIN I (HIGH SENSITIVITY)  TROPONIN I (HIGH SENSITIVITY)    EKG EKG Interpretation  Date/Time:  Monday November 10 2021 12:25:55 EST Ventricular Rate:  92 PR Interval:    QRS Duration: 85 QT Interval:  393 QTC Calculation: 487 R Axis:   103 Text Interpretation: Right and left arm electrode reversal, interpretation assumes no reversal Atrial fibrillation Right axis deviation Low voltage, precordial leads Probable anteroseptal infarct, old When compared to prior, similar to prior. No STEMI Confirmed by Antony Blackbird [phone]) on 11/10/2021 12:28:24 PM  Radiology DG Chest 2 View  Result Date: 11/10/2021 CLINICAL DATA:  Chest pain, shortness of breath EXAM: CHEST - 2 VIEW COMPARISON:  Radiograph 04/17/2021 FINDINGS: Unchanged enlarged cardiac silhouette. There are diffuse interstitial and lower lung predominant alveolar opacities. No large pleural effusion. No visible pneumothorax. No acute osseous abnormality. IMPRESSION: Cardiomegaly with moderate pulmonary edema. Electronically Signed   By: Maurine Simmering M.D.   On: 11/10/2021 13:20    Procedures Procedures   Medications Ordered in ED Medications  potassium chloride SA (KLOR-CON M) CR tablet 40 mEq (has no administration in time range)  insulin aspart (novoLOG) injection 0-9 Units (has no administration in time range)  insulin aspart (novoLOG) injection 0-5 Units (has no administration in time range)  furosemide (LASIX) injection 80 mg (80 mg Intravenous Given 11/10/21 1921)    ED Course/ Medical Decision Making/ A&P                           Medical Decision Making Amount and/or Complexity of Data Reviewed Labs: ordered. Radiology: ordered.  Risk Prescription drug management. Decision regarding hospitalization.   56 y/o F with history of NSTEMI, diabetes, hypertension, CHF, and A-fib  seen emergency department for evaluation of chest pain radiating to her left shoulder started this morning does not know exactly when.  Differential diagnosis includes is not limited to ACS, CHF exacerbation, MSK, pneumonia, pneumothorax, dissection.  Vital signs show mild hypertension.  The patient is afebrile, normal pulse rate, satting well at 96% on room air.  Speaking in full sentences with ease.  The patient has 2+ pitting edema bilaterally in her lower extremities.  Irregularly irregular heartbeat.  Diminished breath sounds at the bases but no respiratory distress.  Unassisted ambulatory pulse oximetry in the emergency department on room air at 93%.  I independently reviewed and interpreted the patient's labs and imaging and agree with radiologist interpretation.  BMP shows mildly decreased potassium at 3.3.  Mildly elevated glucose at 102.  Mildly elevated total protein 8.2 and elevated alk phos at 135 although this is chronic for her.  CBC shows mildly decreased hemoglobin 11.9 probably decrease in patient's 12.5 three months ago.  BNP elevated to 220.9.  Initial troponin at 5 with repeat of 5, delta 0.  Chest x-ray shows cardiomegaly with moderate pulmonary edema.  EKG shows atrial fibrillation that is unchanged from previous EKG.  80 mg of Lasix ordered.  At this time, I have low suspicion for any ACS given the unchanged EKG and flat troponins.  Low suspicion for any pneumonia or pneumothorax given the chest x-ray.  This is not a consistent etiology or presentation with dissection the patient has equal pulses bilaterally and a very well-appearing physical exam.  Consult to cardiology was placed multiple times and was finally able to get a page back hours later and spoke to [place].  She reported that she would evaluate the patient bedside to determine admission versus discharge.    After reviewing the cardiology PA's note, the patient will be admitted to their service.   Final  Clinical Impression(s) / ED Diagnoses Final diagnoses:  Shortness of breath    Rx / DC Orders ED Discharge Orders     None         Sherrell Puller, Hershal Coria 11/12/21 2149    Tegeler, Gwenyth Allegra, MD 11/13/21 [phone]

## 2021-11-11 ENCOUNTER — Encounter: Payer: Medicaid Other | Admitting: Nurse Practitioner

## 2021-11-11 ENCOUNTER — Encounter (HOSPITAL_COMMUNITY): Payer: Self-pay | Admitting: Internal Medicine

## 2021-11-11 ENCOUNTER — Telehealth: Payer: Self-pay | Admitting: Nurse Practitioner

## 2021-11-11 DIAGNOSIS — I5031 Acute diastolic (congestive) heart failure: Secondary | ICD-10-CM

## 2021-11-11 LAB — HEMOGLOBIN A1C
Hgb A1c MFr Bld: 6.2 % — ABNORMAL HIGH (ref 4.8–5.6)
Mean Plasma Glucose: 131.24 mg/dL

## 2021-11-11 LAB — BASIC METABOLIC PANEL
Anion gap: 11 (ref 5–15)
BUN: 17 mg/dL (ref 6–20)
CO2: 24 mmol/L (ref 22–32)
Calcium: 9 mg/dL (ref 8.9–10.3)
Chloride: 105 mmol/L (ref 98–111)
Creatinine, Ser: 0.92 mg/dL (ref 0.44–1.00)
GFR, Estimated: >60 mL/min (ref >60)
Glucose, Bld: 132 mg/dL — ABNORMAL HIGH (ref 70–99)
Potassium: 3.4 mmol/L — ABNORMAL LOW (ref 3.5–5.1)
Sodium: 140 mmol/L (ref 135–145)

## 2021-11-11 LAB — SARS CORONAVIRUS 2 (TAT 6-24 HRS): SARS Coronavirus 2: NEGATIVE

## 2021-11-11 LAB — CBG MONITORING, ED
Glucose-Capillary: 159 mg/dL — ABNORMAL HIGH (ref 70–99)
Glucose-Capillary: 163 mg/dL — ABNORMAL HIGH (ref 70–99)

## 2021-11-11 LAB — TROPONIN I (HIGH SENSITIVITY): Troponin I (High Sensitivity): 6 ng/L (ref <18)

## 2021-11-11 LAB — GLUCOSE, CAPILLARY
Glucose-Capillary: 140 mg/dL — ABNORMAL HIGH (ref 70–99)
Glucose-Capillary: 134 mg/dL — ABNORMAL HIGH (ref 70–99)

## 2021-11-11 LAB — T4, FREE: Free T4: 1.08 ng/dL (ref 0.61–1.12)

## 2021-11-11 MED ORDER — RIVAROXABAN 20 MG PO TABS
20.0000 mg | ORAL_TABLET | Freq: Every day | ORAL | Status: DC
  Administered 2021-11-12 – 2021-11-15 (×4): 20 mg via ORAL
  Filled 2021-11-11 (×4): qty 1

## 2021-11-11 NOTE — Progress Notes (Signed)
Currently admitted for CHF and pulm edema

## 2021-11-11 NOTE — Progress Notes (Signed)
Pt has refused cpap/bipap tonight stating she does not wear one now but "used to." Pt is aware she can request one later if she changes her mind.  RT will cont to monitor.

## 2021-11-11 NOTE — ED Notes (Signed)
This RN attempted x2 for PIV access, no luck, IV team consulted for the IV meds ordered.

## 2021-11-11 NOTE — Telephone Encounter (Signed)
Patient thought she was receiving call instead of video appt today. She needs to reschedule with a phone call instead of video She doenst know how or have access for video

## 2021-11-11 NOTE — Progress Notes (Addendum)
Progress Note  Patient Name: [name] Date of Encounter: 11/11/2021  [facility] Cardiologist: [name], MD   Subjective   Pt states she is diuresing and can now lay flat - although very dyspneic on exam - O2 sat 98% on room air. No further chest pain.   She denies skipping medications and denies recent changes in her torsemide dose. She reports 80 mg torsemide BID. She also denies recent dietary indiscretion.   Inpatient Medications    Scheduled Meds:  amLODipine  10 mg Oral Daily   aspirin EC  81 mg Oral Daily   carvedilol  25 mg Oral BID WC   empagliflozin  10 mg Oral Daily   furosemide  80 mg Intravenous BID   hydrALAZINE  25 mg Oral Q8H   insulin aspart  0-5 Units Subcutaneous QHS   insulin aspart  0-9 Units Subcutaneous TID WC   losartan  25 mg Oral Daily   potassium chloride  20 mEq Oral Daily   risperidone  4 mg Oral Daily   sodium chloride flush  3 mL Intravenous Q12H   Continuous Infusions:  sodium chloride     PRN Meds: sodium chloride, ondansetron (ZOFRAN) IV, sodium chloride flush   Vital Signs    Vitals:   11/11/21 0345 11/11/21 0420 11/11/21 0759 11/11/21 0800  BP:  (!) 157/85 132/69 132/69  Pulse: 94 96 85   Resp: 17 20 19 20   Temp:   97.9 F (36.6 C)   TempSrc:   Oral   SpO2: 96% 96% 90%    No intake or output data in the 24 hours ending 11/11/21 1019 Last 3 Weights 07/30/2021 06/17/2021 04/23/2021  Weight (lbs) 328 lb 4 oz 336 lb 351 lb 8 oz  Weight (kg) 148.893 kg 152.409 kg 159.439 kg      Telemetry    Atrial fibrillation with ventricular rate 80-90s - Personally Reviewed  ECG    No new tracings - Personally Reviewed  Physical Exam   GEN: obese female appears short of breath.   Neck: No JVD - exam difficult Cardiac: irregular rhythm, regular rate, no murmur  Respiratory: diminished in bases GI: Soft, nontender, non-distended  MS: mild B LE edema; No deformity. Neuro:  Nonfocal  Psych: Normal affect   Labs     High Sensitivity Troponin:   Recent Labs  Lab 11/10/21 1250 11/10/21 1546 11/11/21 0417  TROPONINIHS 5 5 6      Chemistry Recent Labs  Lab 11/10/21 1250 11/10/21 2100 11/11/21 0417  NA 142  --  140  K 3.3*  --  3.4*  CL 102  --  105  CO2 30  --  24  GLUCOSE 102*  --  132*  BUN 15  --  17  CREATININE 0.94  --  0.92  CALCIUM 9.3  --  9.0  MG  --  2.2  --   PROT 8.2*  --   --   ALBUMIN 3.8  --   --   AST 27  --   --   ALT 16  --   --   ALKPHOS 135*  --   --   BILITOT 0.7  --   --   GFRNONAA >60  --  >60  ANIONGAP 10  --  11    Lipids No results for input(s): CHOL, TRIG, HDL, LABVLDL, LDLCALC, CHOLHDL in the last 168 hours.  Hematology Recent Labs  Lab 11/10/21 1250  WBC 4.2  RBC 4.42  HGB  11.9*  HCT 38.3  MCV 86.7  MCH 26.9  MCHC 31.1  RDW 15.9*  PLT 208   Thyroid  Recent Labs  Lab 11/10/21 2100  TSH 5.523*    BNP Recent Labs  Lab 11/10/21 1250  BNP 220.9*    DDimer No results for input(s): DDIMER in the last 168 hours.   Radiology    DG Chest 2 View  Result Date: 11/10/2021 CLINICAL DATA:  Chest pain, shortness of breath EXAM: CHEST - 2 VIEW COMPARISON:  Radiograph 04/17/2021 FINDINGS: Unchanged enlarged cardiac silhouette. There are diffuse interstitial and lower lung predominant alveolar opacities. No large pleural effusion. No visible pneumothorax. No acute osseous abnormality. IMPRESSION: Cardiomegaly with moderate pulmonary edema. Electronically Signed   By: Maurine Simmering M.D.   On: 11/10/2021 13:20    Cardiac Studies   Echo 04/18/21:  1. Left ventricular ejection fraction, by estimation, is 60 to 65%. The  left ventricle has normal function. The left ventricle has no regional  wall motion abnormalities. There is severe left ventricular hypertrophy.  Left ventricular diastolic function  could not be evaluated.   2. Right ventricular systolic function is normal. The right ventricular  size is normal. There is moderately elevated pulmonary  artery systolic  pressure.   3. Left atrial size was severely dilated.   4. Right atrial size was mildly dilated.   5. The mitral valve is normal in structure. Mild mitral valve  regurgitation. No evidence of mitral stenosis.   6. Tricuspid valve regurgitation is moderate to severe.   7. The aortic valve is tricuspid. Aortic valve regurgitation is trivial.  No aortic stenosis is present.   8. The inferior vena cava is dilated in size with >50% respiratory  variability, suggesting right atrial pressure of 8 mmHg.   Patient Profile     56 y.o. 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   Assessment & Plan    Acute on chronic diastolic heart failure LVH - home diuretic regimen was unclear - she was taking either 80 mg torsemide BID or 40 mg torsemide BID - volume up and was admitted for further diuresis - BNP mildly up in the setting of morbid obesity, CXR with pulmonary edema - diuresing on 80 mg IV lasix BID with no I&Os charted, no weight - GDMT: jardiance, hydralazine, losartan, coreg - renal function stabke - K 3.4, will replace - volume status is difficult given body habitus, but she is considerably short of breath laying flat with O2 sat 98% on room air - consider increasing to 120 mg IV lasix BID - will request I&Os and daily weights   Hypertension Hypertensive heart disease - Maintained on hydralazine, losartan, coreg, and amlodipine - BP controlled during exam, no hypotension noted   Chest pain - normal coronaries on heart cath 2019 - 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 - hs troponin x 3 negative - no further chest pain - suspect this was related to fluid   Persistent atrial  fibrillation Chronic anticoagulation   - failed prior cardioversions - on coreg and xarelto -can likely titrate up coreg once more euvolemic   DM A1c 6.2% - continue metformin and jardiance   OSA - ?CPAP at home  Thyroid disease - TSH 5.523 - will add on free T4    For questions or updates, please contact Echo Please consult [email or web address] for contact info under        Signed, Ledora Bottcher, PA  11/11/2021, 10:19 AM     I have seen and examined the patient along with Ledora Bottcher, PA .  I have reviewed the chart, notes and new data.  I agree with PA's note.  Key new complaints: improved breathing, but still orthopneic and edematous. She reports a 13 lb weight gain Key examination changes: body habitus limits clinical assessment of volume status, but clearly still hypervolemic Key new findings / data: stable renal parameters  PLAN: Acute decompensation of chronic biventricular heart failure. Continue IV diuretics, need reliable in/out and daily weights. Reviewed importance of dietary sodium restriction, daily weights, signs and symptoms of CHF exacerbation and the need to intervene early when there is evidence of fluid gain/excess, before excessive fluid gain that reduces effectiveness of oral diuretics.  Sanda Klein, MD, [place] [phone] 11/11/2021, 3:54 PM

## 2021-11-12 DIAGNOSIS — I5031 Acute diastolic (congestive) heart failure: Secondary | ICD-10-CM | POA: Diagnosis not present

## 2021-11-12 LAB — GLUCOSE, CAPILLARY
Glucose-Capillary: 139 mg/dL — ABNORMAL HIGH (ref 70–99)
Glucose-Capillary: 130 mg/dL — ABNORMAL HIGH (ref 70–99)
Glucose-Capillary: 169 mg/dL — ABNORMAL HIGH (ref 70–99)
Glucose-Capillary: 155 mg/dL — ABNORMAL HIGH (ref 70–99)

## 2021-11-12 LAB — BASIC METABOLIC PANEL
Anion gap: 9 (ref 5–15)
BUN: 18 mg/dL (ref 6–20)
CO2: 24 mmol/L (ref 22–32)
Calcium: 8.7 mg/dL — ABNORMAL LOW (ref 8.9–10.3)
Chloride: 106 mmol/L (ref 98–111)
Creatinine, Ser: 0.96 mg/dL (ref 0.44–1.00)
GFR, Estimated: >60 mL/min (ref >60)
Glucose, Bld: 137 mg/dL — ABNORMAL HIGH (ref 70–99)
Potassium: 4.1 mmol/L (ref 3.5–5.1)
Sodium: 139 mmol/L (ref 135–145)

## 2021-11-12 NOTE — Progress Notes (Addendum)
Mobility Specialist Progress Note:   11/12/21 1500  Mobility  Activity Ambulated with assistance in hallway  Level of Assistance Standby assist, set-up cues, supervision of patient - no hands on  Assistive Device None  Distance Ambulated (ft) 400 ft  Activity Response Tolerated well  $Mobility charge 1 Mobility    Pre Mobility: HR 82bpm During Mobility: HR 120bpm   Post Mobility: HR 89bpm  Pt displayed SOB during ambulation, SpO2 94% throughout. Back in bed with all needs met.   Nelta Numbers [facility] Phone: [phone] Office Phone: [phone]

## 2021-11-12 NOTE — Plan of Care (Signed)
°  Problem: Education: °Goal: Ability to demonstrate management of disease process will improve °Outcome: Progressing °  °Problem: Education: °Goal: Ability to verbalize understanding of medication therapies will improve °Outcome: Progressing °  °Problem: Education: °Goal: Individualized Educational Video(s) °Outcome: Progressing °  °Problem: Activity: °Goal: Capacity to carry out activities will improve °Outcome: Progressing °  °

## 2021-11-12 NOTE — TOC Progression Note (Signed)
Transition of Care [facility]) - Progression Note    Patient Details  Name: [name] MRN: [id number] Date of Birth: 1966/04/02  Transition of Care [facility]) CM/SW Contact  Zenon Mayo, RN Phone Number: 11/12/2021, 12:27 PM  Clinical Narrative:    From home with family, CHF EX, conts on Lasix IV 80mg  bid.  TOC will continue to follow for dc needs.         Expected Discharge Plan and Services                                                 Social Determinants of Health (SDOH) Interventions    Readmission Risk Interventions Readmission Risk Prevention Plan 04/23/2021  Transportation Screening Complete  HRI or [place] Complete  Social Work Consult for [place] Planning/Counseling Complete  Palliative Care Screening Not Applicable  Medication Review [employer]) Complete  Some recent data might be hidden

## 2021-11-12 NOTE — Consult Note (Signed)
° °  [facility] Inpatient Consult   11/12/2021  [name] [birth year] [id number]  Managed Medicaid:  [employer]  Primary Care Provider:  [name], NP [facility]  Patient was screened for post hospital needs for transition of care.  Electronic medical record reviewed for potential needs, SODH reviewed in plan of care with no needs noted. Came by to speak with patient and she is sound asleep.  Did not awaken however, left a follow up reminder card to PCP appointments with 24 hour nurse advise line for follow up, placed on bedside table, will follow up.  Natividad Brood, RN BSN [facility] Liaison  [phone] business mobile phone Toll free office [phone]  Fax number: [phone] [email or web address]

## 2021-11-12 NOTE — Progress Notes (Addendum)
Progress Note  Patient Name: Nahdia Doucet Date of Encounter: 11/12/2021  [facility] Cardiologist: [name], MD   Subjective   Patient is drowsy on exam. Denies any chest pain. Reports that breathing has improved. Wears a CPAP at home.   Inpatient Medications    Scheduled Meds:  amLODipine  10 mg Oral Daily   aspirin EC  81 mg Oral Daily   carvedilol  25 mg Oral BID WC   empagliflozin  10 mg Oral Daily   furosemide  80 mg Intravenous BID   hydrALAZINE  25 mg Oral Q8H   insulin aspart  0-5 Units Subcutaneous QHS   insulin aspart  0-9 Units Subcutaneous TID WC   losartan  25 mg Oral Daily   potassium chloride  20 mEq Oral Daily   risperidone  4 mg Oral Daily   rivaroxaban  20 mg Oral Q supper   sodium chloride flush  3 mL Intravenous Q12H   Continuous Infusions:  sodium chloride     PRN Meds: sodium chloride, ondansetron (ZOFRAN) IV, sodium chloride flush   Vital Signs    Vitals:   11/12/21 0012 11/12/21 0505 11/12/21 0717 11/12/21 0825  BP: (!) 152/69 131/83  (!) 141/89  Pulse: 84 91  90  Resp: 20 (!) 21  20  Temp: 97.9 F (36.6 C) 97.7 F (36.5 C)  98.1 F (36.7 C)  TempSrc: Oral Oral  Oral  SpO2: 92% 92%  92%  Weight:   (!) 151.4 kg   Height:        Intake/Output Summary (Last 24 hours) at 11/12/2021 0933 Last data filed at 11/12/2021 8413 Gross per 24 hour  Intake 836 ml  Output 1100 ml  Net -264 ml   Last 3 Weights 11/12/2021 11/11/2021 07/30/2021  Weight (lbs) 333 lb 12.4 oz 336 lb 3.2 oz 328 lb 4 oz  Weight (kg) 151.4 kg 152.5 kg 148.893 kg      Telemetry    Atrial Fibrillation, rate in the 80s-90s - Personally Reviewed  ECG    No new tracings since 2/13 - Personally Reviewed  Physical Exam   GEN: No acute distress. Drowsy. Snores when she falls asleep    Neck: No JVD, but difficult to assess due to body habitus  Cardiac: Irregular rate and rhythm, no murmurs.  Respiratory: Clear to auscultation bilaterally. GI: Soft,  nontender, non-distended  MS: Trace edema on large baseline body habitus, No deformity. Neuro:  Nonfocal  Psych: Normal affect   Labs    High Sensitivity Troponin:   Recent Labs  Lab 11/10/21 1250 11/10/21 1546 11/11/21 0417  TROPONINIHS 5 5 6      Chemistry Recent Labs  Lab 11/10/21 1250 11/10/21 2100 11/11/21 0417 11/12/21 0439  NA 142  --  140 139  K 3.3*  --  3.4* 4.1  CL 102  --  105 106  CO2 30  --  24 24  GLUCOSE 102*  --  132* 137*  BUN 15  --  17 18  CREATININE 0.94  --  0.92 0.96  CALCIUM 9.3  --  9.0 8.7*  MG  --  2.2  --   --   PROT 8.2*  --   --   --   ALBUMIN 3.8  --   --   --   AST 27  --   --   --   ALT 16  --   --   --   ALKPHOS 135*  --   --   --  BILITOT 0.7  --   --   --   GFRNONAA >60  --  >60 >60  ANIONGAP 10  --  11 9    Lipids No results for input(s): CHOL, TRIG, HDL, LABVLDL, LDLCALC, CHOLHDL in the last 168 hours.  Hematology Recent Labs  Lab 11/10/21 1250  WBC 4.2  RBC 4.42  HGB 11.9*  HCT 38.3  MCV 86.7  MCH 26.9  MCHC 31.1  RDW 15.9*  PLT 208   Thyroid  Recent Labs  Lab 11/10/21 2100  TSH 5.523*  FREET4 1.08    BNP Recent Labs  Lab 11/10/21 1250  BNP 220.9*    DDimer No results for input(s): DDIMER in the last 168 hours.   Radiology    DG Chest 2 View  Result Date: 11/10/2021 CLINICAL DATA:  Chest pain, shortness of breath EXAM: CHEST - 2 VIEW COMPARISON:  Radiograph 04/17/2021 FINDINGS: Unchanged enlarged cardiac silhouette. There are diffuse interstitial and lower lung predominant alveolar opacities. No large pleural effusion. No visible pneumothorax. No acute osseous abnormality. IMPRESSION: Cardiomegaly with moderate pulmonary edema. Electronically Signed   By: Maurine Simmering M.D.   On: 11/10/2021 13:20    Cardiac Studies   Echo 04/18/21:  1. Left ventricular ejection fraction, by estimation, is 60 to 65%. The  left ventricle has normal function. The left ventricle has no regional  wall motion abnormalities.  There is severe left ventricular hypertrophy.  Left ventricular diastolic function  could not be evaluated.   2. Right ventricular systolic function is normal. The right ventricular  size is normal. There is moderately elevated pulmonary artery systolic  pressure.   3. Left atrial size was severely dilated.   4. Right atrial size was mildly dilated.   5. The mitral valve is normal in structure. Mild mitral valve  regurgitation. No evidence of mitral stenosis.   6. Tricuspid valve regurgitation is moderate to severe.   7. The aortic valve is tricuspid. Aortic valve regurgitation is trivial.  No aortic stenosis is present.   8. The inferior vena cava is dilated in size with >50% respiratory  variability, suggesting right atrial pressure of 8 mmHg.   Patient Profile     56 y.o. 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   Assessment & Plan    Acute on Chronic Diastolic Heart Failure, LVH - Most recent echo on 04/18/21 showed LVEF 60-65%, severe LVH  - BNP elevated to 220.9 (mild elevation, in the setting of obesity)  - CXR showed cardiomegaly with moderate pulmonary edema  - Is/Os started being monitored yesterday afternoon, output 867mL overnight. Difficult to assess volume status due to body habitus   - Per chart review, it appears that her dry weight is around 328 lbs. Weight today 333.78 lbs - Creatinine stable at 0.96. K was supplemented yesterday and is improved today (4.1).  - Continue IV lasix 80mg  BID for at least one more day, anticipate transition to oral tomorrow  - On amlodipine 10 mg, carvedilol 25mg  BID, jardiance 10mg  daily, hydralazine 25mg  TID, losartan 25 mg daily  Persistent Atrial Fibrillation  - Echo 04/18/21 showed a severely dilated  left atrium, has failed prior cardioversions   - On Xarelto 20 mg - On Carvediolol 25 mg BID - Per  telemetry, rate is in the 80s-90s. Could benefit from an increase in carvedilol as patient's volume status improves.    HTN  - On amlodipine, carvedilol, hydralazine, losartan  - BP controled during exam, no hypotension on exam/vital signs review   Chest Pain  - Normal coronaries on cath 2019  - HSTN negative x3  - Denies any further chest pain, likely that discomfort was related to fluid   DM A1c 6.2% - continue jardiance - On SSI while inpatient, will resume metformin at discharge   OSA - Reports compliance with CPAP at home   Thyroid disease - TSH 5.523, T4 1.08   For questions or updates, please contact [facility] Please consult [email or web address] for contact info under        Signed, Margie Billet, PA-C  11/12/2021, 9:33 AM    I have seen and examined the patient along with Margie Billet, PA-C .  I have reviewed the chart, notes and new data.  I agree with PA/NP's note.  Key new complaints: improved breathing Key examination changes: unable to see JVD. Still edematous. Clear lungs Key new findings / data: normal renal parameters.  PLAN: I think we should try to challenge her " dry weight" to <328 lb as allowed by renal function. BNP elevation is minor, less reliable with super-obesity.  Sanda Klein, MD, [place] [phone] 11/12/2021, 12:06 PM

## 2021-11-12 NOTE — Progress Notes (Signed)
Pt declined morning weight at this time.

## 2021-11-12 NOTE — Progress Notes (Signed)
Patient refused BiPAP for the night.  

## 2021-11-13 DIAGNOSIS — J849 Interstitial pulmonary disease, unspecified: Secondary | ICD-10-CM | POA: Diagnosis not present

## 2021-11-13 DIAGNOSIS — I5031 Acute diastolic (congestive) heart failure: Secondary | ICD-10-CM | POA: Diagnosis not present

## 2021-11-13 DIAGNOSIS — I2781 Cor pulmonale (chronic): Secondary | ICD-10-CM | POA: Diagnosis not present

## 2021-11-13 LAB — CBC
HCT: 34.5 % — ABNORMAL LOW (ref 36.0–46.0)
Hemoglobin: 10.6 g/dL — ABNORMAL LOW (ref 12.0–15.0)
MCH: 26.2 pg (ref 26.0–34.0)
MCHC: 30.7 g/dL (ref 30.0–36.0)
MCV: 85.4 fL (ref 80.0–100.0)
Platelets: 190 10*3/uL (ref 150–400)
RBC: 4.04 MIL/uL (ref 3.87–5.11)
RDW: 15.8 % — ABNORMAL HIGH (ref 11.5–15.5)
WBC: 4.1 10*3/uL (ref 4.0–10.5)
nRBC: 0 % (ref 0.0–0.2)

## 2021-11-13 LAB — BASIC METABOLIC PANEL
Anion gap: 10 (ref 5–15)
BUN: 18 mg/dL (ref 6–20)
CO2: 25 mmol/L (ref 22–32)
Calcium: 8.8 mg/dL — ABNORMAL LOW (ref 8.9–10.3)
Chloride: 104 mmol/L (ref 98–111)
Creatinine, Ser: 0.94 mg/dL (ref 0.44–1.00)
GFR, Estimated: >60 mL/min (ref >60)
Glucose, Bld: 130 mg/dL — ABNORMAL HIGH (ref 70–99)
Potassium: 3.4 mmol/L — ABNORMAL LOW (ref 3.5–5.1)
Sodium: 139 mmol/L (ref 135–145)

## 2021-11-13 LAB — GLUCOSE, CAPILLARY
Glucose-Capillary: 144 mg/dL — ABNORMAL HIGH (ref 70–99)
Glucose-Capillary: 175 mg/dL — ABNORMAL HIGH (ref 70–99)
Glucose-Capillary: 139 mg/dL — ABNORMAL HIGH (ref 70–99)
Glucose-Capillary: 180 mg/dL — ABNORMAL HIGH (ref 70–99)

## 2021-11-13 MED ORDER — INSULIN ASPART 100 UNIT/ML IJ SOLN
0.0000 [IU] | Freq: Three times a day (TID) | INTRAMUSCULAR | Status: DC
  Administered 2021-11-13: 1 [IU] via SUBCUTANEOUS
  Administered 2021-11-13: 2 [IU] via SUBCUTANEOUS
  Administered 2021-11-13 – 2021-11-14 (×3): 1 [IU] via SUBCUTANEOUS
  Administered 2021-11-14: 2 [IU] via SUBCUTANEOUS
  Administered 2021-11-15: 1 [IU] via SUBCUTANEOUS
  Administered 2021-11-15: 2 [IU] via SUBCUTANEOUS

## 2021-11-13 MED ORDER — SPIRONOLACTONE 25 MG PO TABS
25.0000 mg | ORAL_TABLET | Freq: Every day | ORAL | Status: DC
  Administered 2021-11-13 – 2021-11-15 (×3): 25 mg via ORAL
  Filled 2021-11-13 (×3): qty 1

## 2021-11-13 MED ORDER — POTASSIUM CHLORIDE CRYS ER 20 MEQ PO TBCR
20.0000 meq | EXTENDED_RELEASE_TABLET | Freq: Once | ORAL | Status: AC
  Administered 2021-11-13: 20 meq via ORAL

## 2021-11-13 NOTE — Progress Notes (Signed)
CHF pt is noncompliant with PO intake restrictions/ recommendations (food intake and fluid intake). Pt eats a lot of snacks between meals and has extra fluid intake from the sink in her room.

## 2021-11-13 NOTE — Progress Notes (Addendum)
Progress Note  Patient Name: [name] Date of Encounter: 11/13/2021  [facility] Cardiologist: [name], MD   Subjective   Patient denies any chest pain, palpitations. Breathing has improved, but she is unable to lay flat without dyspnea. Feels like her legs are still somewhat swollen   Inpatient Medications    Scheduled Meds:  amLODipine  10 mg Oral Daily   aspirin EC  81 mg Oral Daily   carvedilol  25 mg Oral BID WC   empagliflozin  10 mg Oral Daily   furosemide  80 mg Intravenous BID   hydrALAZINE  25 mg Oral Q8H   insulin aspart  0-5 Units Subcutaneous QHS   insulin aspart  0-9 Units Subcutaneous TID WC   losartan  25 mg Oral Daily   potassium chloride  20 mEq Oral Daily   risperidone  4 mg Oral Daily   rivaroxaban  20 mg Oral Q supper   sodium chloride flush  3 mL Intravenous Q12H   Continuous Infusions:  sodium chloride     PRN Meds: sodium chloride, ondansetron (ZOFRAN) IV, sodium chloride flush   Vital Signs    Vitals:   11/12/21 1116 11/12/21 1624 11/12/21 1944 11/13/21 0559  BP: (!) 146/75 126/85 (!) 141/82 130/77  Pulse: 69 84 86 80  Resp: 20 20 20 20   Temp: 97.6 F (36.4 C) 98.4 F (36.9 C) 98.2 F (36.8 C) 98.2 F (36.8 C)  TempSrc: Axillary Oral Oral Oral  SpO2: 97% 97% 96% 92%  Weight:      Height:        Intake/Output Summary (Last 24 hours) at 11/13/2021 0830 Last data filed at 11/13/2021 0600 Gross per 24 hour  Intake 952 ml  Output 4200 ml  Net -3248 ml   Last 3 Weights 11/12/2021 11/11/2021 07/30/2021  Weight (lbs) 333 lb 12.4 oz 336 lb 3.2 oz 328 lb 4 oz  Weight (kg) 151.4 kg 152.5 kg 148.893 kg      Telemetry    Atrial fibrillation, HR in the 70s-80s - Personally Reviewed  ECG    No new tracings - Personally Reviewed  Physical Exam   GEN: No acute distress.   Neck: No JVD noted, however body habitus makes it difficult to assess  Cardiac: Irregular rate and rhythm, no murmurs, rubs, or gallops.  Respiratory:  Clear to auscultation bilaterally. GI: Soft, nontender, non-distended  MS: 1-2+ edema in BLE on large baseline body habitus; No deformity. Able to move in the bed independently  Neuro:  Nonfocal  Psych: Normal affect   Labs    High Sensitivity Troponin:   Recent Labs  Lab 11/10/21 1250 11/10/21 1546 11/11/21 0417  TROPONINIHS 5 5 6      Chemistry Recent Labs  Lab 11/10/21 1250 11/10/21 2100 11/11/21 0417 11/12/21 0439 11/13/21 0451  NA 142  --  140 139 139  K 3.3*  --  3.4* 4.1 3.4*  CL 102  --  105 106 104  CO2 30  --  24 24 25   GLUCOSE 102*  --  132* 137* 130*  BUN 15  --  17 18 18   CREATININE 0.94  --  0.92 0.96 0.94  CALCIUM 9.3  --  9.0 8.7* 8.8*  MG  --  2.2  --   --   --   PROT 8.2*  --   --   --   --   ALBUMIN 3.8  --   --   --   --  AST 27  --   --   --   --   ALT 16  --   --   --   --   ALKPHOS 135*  --   --   --   --   BILITOT 0.7  --   --   --   --   GFRNONAA >60  --  >60 >60 >60  ANIONGAP 10  --  11 9 10     Lipids No results for input(s): CHOL, TRIG, HDL, LABVLDL, LDLCALC, CHOLHDL in the last 168 hours.  Hematology Recent Labs  Lab 11/10/21 1250 11/13/21 0451  WBC 4.2 4.1  RBC 4.42 4.04  HGB 11.9* 10.6*  HCT 38.3 34.5*  MCV 86.7 85.4  MCH 26.9 26.2  MCHC 31.1 30.7  RDW 15.9* 15.8*  PLT 208 190   Thyroid  Recent Labs  Lab 11/10/21 2100  TSH 5.523*  FREET4 1.08    BNP Recent Labs  Lab 11/10/21 1250  BNP 220.9*    DDimer No results for input(s): DDIMER in the last 168 hours.   Radiology    No results found.  Cardiac Studies   Echo 04/18/21:  1. Left ventricular ejection fraction, by estimation, is 60 to 65%. The  left ventricle has normal function. The left ventricle has no regional  wall motion abnormalities. There is severe left ventricular hypertrophy.  Left ventricular diastolic function  could not be evaluated.   2. Right ventricular systolic function is normal. The right ventricular  size is normal. There is  moderately elevated pulmonary artery systolic  pressure.   3. Left atrial size was severely dilated.   4. Right atrial size was mildly dilated.   5. The mitral valve is normal in structure. Mild mitral valve  regurgitation. No evidence of mitral stenosis.   6. Tricuspid valve regurgitation is moderate to severe.   7. The aortic valve is tricuspid. Aortic valve regurgitation is trivial.  No aortic stenosis is present.   8. The inferior vena cava is dilated in size with >50% respiratory  variability, suggesting right atrial pressure of 8 mmHg.   Patient Profile     56 y.o. 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   Assessment & Plan   Acute on Chronic Diastolic Heart Failure, LVH - Most recent echo on 04/18/21 showed LVEF 60-65%, severe LVH  - BNP elevated to 220.9 (mild elevation, in the setting of obesity)  - CXR showed cardiomegaly with moderate pulmonary edema - Per chart review, it appears that her dry weight is around 328 lbs. Weight 333 lbs yesterday, not yet measured this AM. Volume status difficult to assess due to body habitus.  - K 3.4 this AM. On 20 mEq K supplementation daily, will give an additional 20 mEq this AM  - Continue IV lasix 80mg  BID. Currently net -3.5L fluid since admission. As body habitus makes volume difficult to evaluate, will continue to diurese as renal function allows. Creatinine stable at 0.94.  - On amlodipine 10 mg, carvedilol 25mg  BID, jardiance 10mg  daily, hydralazine 25mg  TID, losartan 25 mg daily  Persistent Atrial Fibrillation  - Echo 04/18/21 showed a severely dilated left atrium, has failed prior cardioversions  - On Xarelto 20 mg - On Carvediolol 25 mg BID -  Per telemetry, rate is in the 70s-80s  HTN  - On  amlodipine, carvedilol, hydralazine, losartan  - BP controled during exam, no hypotension on exam/vital signs review    Chest Pain: Resolved  - Normal coronaries on cath 2019  - HSTN negative x3  - Denies any further chest pain, likely that discomfort was related to fluid    DM A1c 6.2% - continue jardiance - On SSI while inpatient, will resume metformin at discharge   OSA, obesity hypoventilation syndrome   - Reports compliance with CPAP at home   Thyroid disease - TSH 5.523, T4 1.08       For questions or updates, please contact [facility] Please consult [email or web address] for contact info under        Signed, Margie Billet, PA-C  11/13/2021, 8:30 AM    I have seen and examined the patient along with Margie Billet, PA-C .  I have reviewed the chart, notes and new data.  I agree with PA/NP's note.  Key new complaints: still a little orthopneic and still edematous Key examination changes: no weight today, but net diuresis 3.5 liters (assuming intake charted fully, but nursde reports she is drinking from sink as well as measured fluids). Key new findings / data: K 3.4, creat 0.94  PLAN: Add low dose spironolactone to help with diuresis and K levels, reinforce fluid restriction 2000 mL and full accounting of intake.  Sanda Klein, MD, [place] [phone] 11/13/2021, 9:09 AM

## 2021-11-13 NOTE — Progress Notes (Signed)
Mobility Specialist Progress Note:   11/13/21 1200  Mobility  Activity Ambulated independently in hallway  Level of Assistance Standby assist, set-up cues, supervision of patient - no hands on  Assistive Device None  Distance Ambulated (ft) 400 ft  Activity Response Tolerated well  $Mobility charge 1 Mobility    Pre Mobility: HR 82bpm During Mobility: HR 100bpm Post Mobility: HR 87bpm  Pt required encouragement to participate in mobility today. Distance limited secondary to fatigue. Back in bed with all needs met, pt waiting for lunch.  Nelta Numbers [facility] Phone: [phone] Office Phone: [phone]

## 2021-11-13 NOTE — Progress Notes (Signed)
RT note. Patient bipap QHS, patient on rm air sat 93%, RT will continue to monitor.

## 2021-11-14 DIAGNOSIS — I5031 Acute diastolic (congestive) heart failure: Secondary | ICD-10-CM | POA: Diagnosis not present

## 2021-11-14 LAB — BASIC METABOLIC PANEL
Anion gap: 10 (ref 5–15)
BUN: 22 mg/dL — ABNORMAL HIGH (ref 6–20)
CO2: 24 mmol/L (ref 22–32)
Calcium: 9.3 mg/dL (ref 8.9–10.3)
Chloride: 102 mmol/L (ref 98–111)
Creatinine, Ser: 1.01 mg/dL — ABNORMAL HIGH (ref 0.44–1.00)
GFR, Estimated: >60 mL/min (ref >60)
Glucose, Bld: 168 mg/dL — ABNORMAL HIGH (ref 70–99)
Potassium: 3.8 mmol/L (ref 3.5–5.1)
Sodium: 136 mmol/L (ref 135–145)

## 2021-11-14 LAB — GLUCOSE, CAPILLARY
Glucose-Capillary: 141 mg/dL — ABNORMAL HIGH (ref 70–99)
Glucose-Capillary: 146 mg/dL — ABNORMAL HIGH (ref 70–99)
Glucose-Capillary: 190 mg/dL — ABNORMAL HIGH (ref 70–99)
Glucose-Capillary: 176 mg/dL — ABNORMAL HIGH (ref 70–99)

## 2021-11-14 NOTE — Progress Notes (Signed)
Heart Failure Navigation Team Progress Note  PCP: [name], NP Primary Cardiologist: [name] MD Admitted from: home  Past Medical History:  Diagnosis Date   Acute on chronic diastolic congestive heart failure ([place]) 11/02/2013   10/03/2015, 11/13/2015, 08/03/2017   Benign essential HTN 11/28/2013   Bipolar disease, chronic ([place])    Chest pain    a. 2012 Myoview: EF 63%, no isch/infarct;  b. 04/2016 Lexiscan MV: EF 73%, no ischemia/infarct-->Low risk.   Chronic diastolic CHF (congestive heart failure) ([place]) 07/23/2011   a. 2015 Echo: EF 55-60%, Gr2 DD;  b. 09/2015 Echo: EF 60-65%, no rwma, mod dil LA, PASP 39mmHg.   Cor pulmonale (chronic) ([facility])    History of thyrotoxicosis    HTN (hypertension) 11/28/2013   Hypertensive heart disease 10/18/2013   Hypoglycemia    Insulin dependent type 2 diabetes mellitus, uncontrolled    Mediastinal adenopathy    Morbid obesity due to excess calories ([place]) 02/19/2011   Morbid obesity with BMI of 50.0-59.9, adult ([facility])    OSA (obstructive sleep apnea) 03/06/2011   Persistent atrial fibrillation ([place]) 12/09/2017   Pulmonary HTN, moderate to severe 11/03/2013   Sinusitis, chronic 01/02/2015   SVT (supraventricular tachycardia) ([place]) 12/06/2013   Uncontrolled type 2 diabetes mellitus with hyperglycemia ([place])     Social History   Socioeconomic History   Marital status: Single    Spouse name: Not on file   Number of children: 0   Years of education: 18   Highest education level: Not on file  Occupational History   Occupation: unemployed  Tobacco Use   Smoking status: Never   Smokeless tobacco: Never  Vaping Use   Vaping Use: Never used  Substance and Sexual Activity   Alcohol use: No   Drug use: No   Sexual activity: Not Currently    Birth control/protection: None  Other Topics Concern   Not on file  Social History Narrative   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 Then was enrolled in a MPH  program at [employer]. But ran out of money and is no longer attending school. (Note patient has bipolar disorder).      Born in [place] but lived in [place] before coming back to [place].       Primary language is Arabic. Lives with mother and brother.   Social Determinants of Health   Financial Resource Strain: Not on file  Food Insecurity: Not on file  Transportation Needs: Not on file  Physical Activity: Not on file  Stress: Not on file  Social Connections: Not on file     Heart & Vascular Transition of Care Clinic follow-up: Scheduled for 11/25/21 at 9am.  Confirmed transportation.  Immediate social needs:   HF CSW spoke with Ms. Vogt at bedside an provided her with an appointment card for the [facility] outpatient clinic and encouraged them to follow up and to attend the appointment and bring their medications and if anything changes to please reach out so that CSW/HV clinic team can provide support. Ms. Acocella reported she will be out of town by the 25th and won't be able to make that appointment and will need something sooner. HF CSW reached out to the heart failure team to see about rescheduling.  Sada Mazzoni, MSW, LCSW [phone] Heart Failure Social Worker

## 2021-11-14 NOTE — Progress Notes (Signed)
Progress Note  Patient Name: [name] Date of Encounter: 11/14/2021  [facility] Cardiologist: [name], MD   Subjective   Better, walked in hallway 400 feet without oxygen. Net diuresis 8 L since admission, weight reportedly unchanged.  Suspicion for under recording of her intake. Renal parameters with very slight increase from yesterday.  Potassium 3.8  Inpatient Medications    Scheduled Meds:  amLODipine  10 mg Oral Daily   aspirin EC  81 mg Oral Daily   carvedilol  25 mg Oral BID WC   empagliflozin  10 mg Oral Daily   furosemide  80 mg Intravenous BID   hydrALAZINE  25 mg Oral Q8H   insulin aspart  0-5 Units Subcutaneous QHS   insulin aspart  0-9 Units Subcutaneous TID WC   losartan  25 mg Oral Daily   potassium chloride  20 mEq Oral Daily   risperidone  4 mg Oral Daily   rivaroxaban  20 mg Oral Q supper   sodium chloride flush  3 mL Intravenous Q12H   spironolactone  25 mg Oral Daily   Continuous Infusions:  sodium chloride     PRN Meds: sodium chloride, ondansetron (ZOFRAN) IV, sodium chloride flush   Vital Signs    Vitals:   11/13/21 2020 11/14/21 0347 11/14/21 0834 11/14/21 1103  BP: 117/88 140/81 123/76 (!) 149/97  Pulse: 71 75 82 89  Resp: 18 18 19 20   Temp: 97.9 F (36.6 C) 98.6 F (37 C) 97.9 F (36.6 C) 98.2 F (36.8 C)  TempSrc: Oral Oral Oral Oral  SpO2: 90% 91%  92%  Weight:  (!) 152.3 kg    Height:        Intake/Output Summary (Last 24 hours) at 11/14/2021 1300 Last data filed at 11/14/2021 1110 Gross per 24 hour  Intake 1063 ml  Output 5050 ml  Net -3987 ml   Last 3 Weights 11/14/2021 11/13/2021 11/12/2021  Weight (lbs) 335 lb 11.2 oz 333 lb 5.4 oz 333 lb 12.4 oz  Weight (kg) 152.273 kg 151.2 kg 151.4 kg      Telemetry    Atrial fibrillation with controlled ventricular rate - Personally Reviewed  ECG    No new tracing- Personally Reviewed  Physical Exam  Super obesity limits the physical exam GEN: No acute  distress.   Neck: No JVD Cardiac: RRR, no murmurs, rubs, or gallops.  Respiratory: Clear to auscultation bilaterally. GI: Soft, nontender, non-distended  MS: No edema; No deformity. Neuro:  Nonfocal  Psych: Normal affect   Labs    High Sensitivity Troponin:   Recent Labs  Lab 11/10/21 1250 11/10/21 1546 11/11/21 0417  TROPONINIHS 5 5 6      Chemistry Recent Labs  Lab 11/10/21 1250 11/10/21 2100 11/11/21 0417 11/12/21 0439 11/13/21 0451 11/14/21 0410  NA 142  --    < > 139 139 136  K 3.3*  --    < > 4.1 3.4* 3.8  CL 102  --    < > 106 104 102  CO2 30  --    < > 24 25 24   GLUCOSE 102*  --    < > 137* 130* 168*  BUN 15  --    < > 18 18 22*  CREATININE 0.94  --    < > 0.96 0.94 1.01*  CALCIUM 9.3  --    < > 8.7* 8.8* 9.3  MG  --  2.2  --   --   --   --  PROT 8.2*  --   --   --   --   --   ALBUMIN 3.8  --   --   --   --   --   AST 27  --   --   --   --   --   ALT 16  --   --   --   --   --   ALKPHOS 135*  --   --   --   --   --   BILITOT 0.7  --   --   --   --   --   GFRNONAA >60  --    < > >60 >60 >60  ANIONGAP 10  --    < > 9 10 10    < > = values in this interval not displayed.    Lipids No results for input(s): CHOL, TRIG, HDL, LABVLDL, LDLCALC, CHOLHDL in the last 168 hours.  Hematology Recent Labs  Lab 11/10/21 1250 11/13/21 0451  WBC 4.2 4.1  RBC 4.42 4.04  HGB 11.9* 10.6*  HCT 38.3 34.5*  MCV 86.7 85.4  MCH 26.9 26.2  MCHC 31.1 30.7  RDW 15.9* 15.8*  PLT 208 190   Thyroid  Recent Labs  Lab 11/10/21 2100  TSH 5.523*  FREET4 1.08    BNP Recent Labs  Lab 11/10/21 1250  BNP 220.9*    DDimer No results for input(s): DDIMER in the last 168 hours.   Radiology    No results found.  Cardiac Studies   Echo 04/18/21:  1. Left ventricular ejection fraction, by estimation, is 60 to 65%. The  left ventricle has normal function. The left ventricle has no regional  wall motion abnormalities. There is severe left ventricular hypertrophy.  Left  ventricular diastolic function  could not be evaluated.   2. Right ventricular systolic function is normal. The right ventricular  size is normal. There is moderately elevated pulmonary artery systolic  pressure.   3. Left atrial size was severely dilated.   4. Right atrial size was mildly dilated.   5. The mitral valve is normal in structure. Mild mitral valve  regurgitation. No evidence of mitral stenosis.   6. Tricuspid valve regurgitation is moderate to severe.   7. The aortic valve is tricuspid. Aortic valve regurgitation is trivial.  No aortic stenosis is present.   8. The inferior vena cava is dilated in size with >50% respiratory  variability, suggesting right atrial pressure of 8 mmHg.   Patient Profile     56 y.o. 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     Assessment & Plan    Acute on Chronic Diastolic Heart Failure, LVH - Most recent echo on 04/18/21 showed LVEF 60-65%, severe LVH  - BNP elevated to 220.9 (mild elevation, in the setting of obesity)  - CXR showed cardiomegaly with moderate pulmonary edema -Estimated dry weight had been around 328 pounds, but was hoping to challenge that lower.  Unfortunately her weight is staying virtually unchanged.  There is a discrepancy with the recorded intake/output.  Currently net -8 L fluid since admission.  - On amlodipine 10 mg, carvedilol 25mg  BID, jardiance 10mg  daily, hydralazine 25mg  TID, losartan 25 mg daily - Continue IV lasix 80mg  BID.  Persistent Atrial Fibrillation  - Echo 04/18/21 showed a severely dilated left atrium, has failed prior cardioversions  - On Xarelto 20 mg - On Carvedilol 25 mg BID, rate well controlled   HTN  - On amlodipine, carvedilol, hydralazine, losartan  - BP  controled during exam, no hypotension on exam/vital signs review    Chest Pain: Resolved  - Normal coronaries on cath 2019  - HSTN negative x3  - Denies any further chest pain, likely that discomfort was related to fluid    DM A1c 6.2% - continue jardiance - On SSI while inpatient, will resume metformin at discharge   OSA, obesity hypoventilation syndrome   - Reports compliance with CPAP at home       For questions or updates, please contact [facility] Please consult [email or web address] for contact info under        Signed, Sanda Klein, MD  11/14/2021, 1:00 PM

## 2021-11-14 NOTE — Progress Notes (Signed)
Heart Failure Nurse Navigator Progress Note  Echo July 2022: EF 60-65%, severe LVH  Diuresed 8 L this admission  Previously saw Dr [facility] back in 2014.  Appt sch with Heart & Vascular TOC clinic for 11/25/21 at 9:00 AM  Kevan Rosebush, RN, BSN, [facility] Heart Failure Navigator Heart & Vascular Care Navigation Team

## 2021-11-14 NOTE — Progress Notes (Signed)
Mobility Specialist Progress Note:   11/14/21 1130  Mobility  Activity Ambulated independently in hallway  Level of Assistance Standby assist, set-up cues, supervision of patient - no hands on  Assistive Device None  Distance Ambulated (ft) 400 ft  Activity Response Tolerated well  $Mobility charge 1 Mobility    Pre Mobility: HR 86bpm During Mobility: HR 110bpm Post Mobility: HR 97bpm  Pt asx during ambulation. Back in bed with all needs met.  Nelta Numbers [facility] Phone: [phone] Office Phone: [phone]

## 2021-11-15 DIAGNOSIS — I5033 Acute on chronic diastolic (congestive) heart failure: Secondary | ICD-10-CM | POA: Diagnosis not present

## 2021-11-15 LAB — BASIC METABOLIC PANEL
Anion gap: 12 (ref 5–15)
BUN: 28 mg/dL — ABNORMAL HIGH (ref 6–20)
CO2: 23 mmol/L (ref 22–32)
Calcium: 9 mg/dL (ref 8.9–10.3)
Chloride: 104 mmol/L (ref 98–111)
Creatinine, Ser: 1.13 mg/dL — ABNORMAL HIGH (ref 0.44–1.00)
GFR, Estimated: 58 mL/min — ABNORMAL LOW (ref >60)
Glucose, Bld: 156 mg/dL — ABNORMAL HIGH (ref 70–99)
Potassium: 4.3 mmol/L (ref 3.5–5.1)
Sodium: 139 mmol/L (ref 135–145)

## 2021-11-15 LAB — GLUCOSE, CAPILLARY
Glucose-Capillary: 159 mg/dL — ABNORMAL HIGH (ref 70–99)
Glucose-Capillary: 142 mg/dL — ABNORMAL HIGH (ref 70–99)

## 2021-11-15 MED ORDER — CARVEDILOL 25 MG PO TABS: 25.0000 mg | ORAL_TABLET | Freq: Two times a day (BID) | ORAL | 1 refills | Status: DC

## 2021-11-15 MED ORDER — RIVAROXABAN 20 MG PO TABS
20.0000 mg | ORAL_TABLET | Freq: Every day | ORAL | 1 refills | Status: DC
  Filled 2021-11-15: qty 90, 90d supply, fill #0

## 2021-11-15 MED ORDER — HYDRALAZINE HCL 25 MG PO TABS: 25.0000 mg | ORAL_TABLET | Freq: Three times a day (TID) | ORAL | 1 refills | Status: DC

## 2021-11-15 MED ORDER — LOSARTAN POTASSIUM 25 MG PO TABS
25.0000 mg | ORAL_TABLET | Freq: Every day | ORAL | 2 refills | Status: DC
  Filled 2021-11-15: qty 90, 90d supply, fill #0

## 2021-11-15 MED ORDER — TORSEMIDE 20 MG PO TABS
80.0000 mg | ORAL_TABLET | Freq: Two times a day (BID) | ORAL | 1 refills | Status: DC
  Filled 2021-11-15: qty 720, 90d supply, fill #0

## 2021-11-15 MED ORDER — HYDRALAZINE HCL 25 MG PO TABS
25.0000 mg | ORAL_TABLET | Freq: Three times a day (TID) | ORAL | 2 refills | Status: DC
  Filled 2021-11-15: qty 270, 90d supply, fill #0

## 2021-11-15 MED ORDER — CARVEDILOL 25 MG PO TABS
25.0000 mg | ORAL_TABLET | Freq: Two times a day (BID) | ORAL | 2 refills | Status: DC
  Filled 2021-11-15: qty 180, 90d supply, fill #0

## 2021-11-15 MED ORDER — LOSARTAN POTASSIUM 25 MG PO TABS: 25.0000 mg | ORAL_TABLET | Freq: Every day | ORAL | 1 refills | Status: DC

## 2021-11-15 MED ORDER — EMPAGLIFLOZIN 10 MG PO TABS
10.0000 mg | ORAL_TABLET | Freq: Every day | ORAL | 1 refills | Status: DC
  Filled 2021-11-15: qty 90, 90d supply, fill #0

## 2021-11-15 MED ORDER — RIVAROXABAN 20 MG PO TABS: 20.0000 mg | ORAL_TABLET | Freq: Every day | ORAL | 1 refills | Status: DC

## 2021-11-15 MED ORDER — SPIRONOLACTONE 25 MG PO TABS
25.0000 mg | ORAL_TABLET | Freq: Every day | ORAL | 6 refills | Status: DC
  Filled 2021-11-15: qty 30, 30d supply, fill #0

## 2021-11-15 MED ORDER — EMPAGLIFLOZIN 10 MG PO TABS: 10.0000 mg | ORAL_TABLET | Freq: Every day | ORAL | 1 refills | Status: AC

## 2021-11-15 MED ORDER — AMLODIPINE BESYLATE 10 MG PO TABS: 10.0000 mg | ORAL_TABLET | Freq: Every day | ORAL | 3 refills | Status: DC

## 2021-11-15 MED ORDER — AMLODIPINE BESYLATE 10 MG PO TABS
10.0000 mg | ORAL_TABLET | Freq: Every day | ORAL | 3 refills | Status: DC
  Filled 2021-11-15: qty 90, 90d supply, fill #0

## 2021-11-15 MED ORDER — TORSEMIDE 20 MG PO TABS: 80.0000 mg | ORAL_TABLET | Freq: Two times a day (BID) | ORAL | 1 refills | Status: DC

## 2021-11-15 NOTE — TOC CM/SW Note (Signed)
Provided Xarelto pharmacy discount card to patient.    Marthenia Rolling, MSN, RN,BSN Inpatient [facility] Case Manager [phone]

## 2021-11-15 NOTE — Discharge Summary (Addendum)
Discharge Summary    Patient ID: [name] MRN: [id number]; DOB: [birth year]  Admit date: 11/10/2021 Discharge date: 11/15/2021  PCP:  [name], NP   [facility] Providers Cardiologist:  [name], MD  Cardiology APP:  Deberah Pelton, NP      Discharge Diagnoses    Principal Problem:   Acute on chronic diastolic congestive heart failure [facility]) Active Problems:   Class 3 severe obesity due to excess calories with serious comorbidity and body mass index (BMI) of 60.0 to 69.9 in adult ([place])   OSA (obstructive sleep apnea)- non compliant with C-pap   Hypertensive heart disease   Non-insulin dependent type 2 diabetes mellitus ([place])   Obesity hypoventilation syndrome ([place])   AKI  Diagnostic Studies/Procedures    None   History of Present Illness     [name] is a 56 y.o. 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  Courses between PA and AO.  Mild dynamic compression is noted. Did have VFib with heart cath.    Last admit for CHF 03/2021 and no follow up since that time.  Last echo 04/18/21 with EF 60-65%, no RWMA, severely LVH RV normal with moderately elevated PA systolic pressure, Lt atrial size severely dilated and RA mildly dilated.  Mid MR mod to severe TR    Numerous attempts to DCCV to SR but all unsuccessful.   She presented with chest pain and SOB with increasing edema.  SOB began 1 week ago.  She has not done anything different.  No increase of salt, no fever, now some constipation along with SOB. Has to sit up at night to sleep.  She has not missed any meds but could not tell me why she did not have follow up after last hospitalization.   She  does not wear her CPAP. Has not missed any xarelto.    Hospital Course     Consultants: None   Acute on Chronic Diastolic Heart Failure, LVH - Most recent echo on 04/18/21 showed LVEF 60-65%, severe LVH. No echo this admission.  - BNP elevated to 220.9 (mild elevation, in the setting of obesity)  - CXR showed cardiomegaly with moderate pulmonary edema -Estimated dry weight had been around 328 pounds, but was hoping to challenge that lower.  Unfortunately her weight is staying virtually unchanged.  There is a discrepancy with the recorded intake/output.  - Net I & O negative 11L. Weight 336>>>335lb.  - Continue amlodipine 10 mg qd, carvedilol 83m BID, jardiance 136mdaily, hydralazine 2555mID, losartan 25 mg daily -  Added SpiArlyce Harmanis admission  - Continue home dose of Torsemide - Patient will be follow up at [facility] clinic   2. Persistent Atrial Fibrillation  - Echo 04/18/21 showed a severely dilated left atrium, has failed prior cardioversions  - On Xarelto 20 mg - On Carvedilol 25 mg BID, rate well controlled   3. HTN  - BP stable on current medications.    4. Chest Pain:  - Resolved  - Normal coronaries on cath 2019  - HSTN negative x3  - Denies any further chest pain, likely that discomfort was related to fluid    5. DM -  A1c 6.2% - continue jardiance - On SSI while inpatient, resume home Glipizide and metformin at discharge   6. OSA, obesity hypoventilation syndrome   -  non - compliance with CPAP at home ?   7. AKI - Scr 1.13 at discharge. Repeat BMP at follow on 2/28  Did the patient have an acute coronary syndrome (MI, NSTEMI, STEMI, etc) this admission?:  No                               Did the patient have a percutaneous coronary intervention (stent / angioplasty)?:  No.    Discharge Vitals Blood pressure 125/77, pulse 79, temperature 97.6 F (36.4 C), temperature source Oral, resp. rate 20, height '5\' 7"'  (1.702 m), weight (!) 152.3 kg, SpO2 99 %.   Filed Weights   11/12/21 0717 11/13/21 0924 11/14/21 0347  Weight: (!) 151.4 kg (!) 151.2 kg (!) 152.3 kg   CV - RRR Lungs - clear bilaterally Ext - no edema.   Labs & Radiologic Studies    CBC Recent Labs    11/13/21 0451  WBC 4.1  HGB 10.6*  HCT 34.5*  MCV 85.4  PLT 747   Basic Metabolic Panel Recent Labs    11/14/21 0410 11/15/21 0359  NA 136 139  K 3.8 4.3  CL 102 104  CO2 24 23  GLUCOSE 168* 156*  BUN 22* 28*  CREATININE 1.01* 1.13*  CALCIUM 9.3 9.0   High Sensitivity Troponin:   Recent Labs  Lab 11/10/21 1250 11/10/21 1546 11/11/21 0417  TROPONINIHS '5 5 6    ' _____________  DG Chest 2 View  Result Date: 11/10/2021 CLINICAL DATA:  Chest pain, shortness of breath EXAM: CHEST - 2 VIEW COMPARISON:  Radiograph 04/17/2021 FINDINGS: Unchanged enlarged cardiac silhouette. There are diffuse interstitial and lower lung predominant alveolar opacities. No large pleural effusion. No visible pneumothorax. No acute osseous abnormality. IMPRESSION: Cardiomegaly with moderate pulmonary edema. Electronically Signed   By: Maurine Simmering M.D.   On: 11/10/2021 13:20   Disposition   Pt is being discharged home today in good condition.  Follow-up Plans & Appointments    TOC Heart Failure clinic 11/25/21 Discharge Instructions     Diet - low sodium heart healthy   Complete by: As directed    Increase activity slowly   Complete by: As directed        Discharge Medications   Allergies as of 11/15/2021       Reactions   Acetaminophen Other (See Comments)   Seizure-like "fits" as a child   Caffeine    Tense, anxiety, increased urination   [place] [dapagliflozin] Other (See Comments)   Hallucinations, drop in blood sugar   Lisinopril Rash   Rash with lisinopril; but fosinopril is ok per patient        Medication List     TAKE these medications    Accu-Chek Guide test strip Generic drug: glucose blood USE AS DIRECTED TO TEST BLOOD SUGAR THREE TIMES DAILY    Accu-Chek Guide w/Device Kit Use to check blood sugar three times daily.   amLODipine 10 MG tablet Commonly known as: NORVASC Take 1 tablet (10 mg total) by mouth daily. Please fill as a 90 day supply   Blood Pressure Monitor Devi Please provide patient with insurance approved blood pressure monitor I10.0   carvedilol 25 MG tablet Commonly known as: COREG Take 1 tablet (25 mg total) by mouth 2 (  two) times daily with a meal. Please fill as a 90 day supply   escitalopram 20 MG tablet Commonly known as: LEXAPRO Take 1 tablet (20 mg total) by mouth every morning. Please fill as a 90 day supply   EYE ALLERGY RELIEF OP Place 1 drop into both eyes daily as needed (allergy).   glipiZIDE 10 MG 24 hr tablet Commonly known as: GLUCOTROL XL Take 1 tablet (10 mg total) by mouth daily with breakfast. Please fill as a 90 day supply   hydrALAZINE 25 MG tablet Commonly known as: APRESOLINE Take 1 tablet (25 mg total) by mouth every 8 (eight) hours. Please fill as a 90 day supply   hydrOXYzine 25 MG tablet Commonly known as: ATARAX Take 1 tablet (25 mg total) by mouth 3 (three) times daily as needed for itching.   Incontinence Brief Large Misc Please provide patient with insurance approved incontinence supplies/briefs   Jardiance 10 MG Tabs tablet Generic drug: empagliflozin Take 1 tablet (10 mg total) by mouth daily. Please fill as a 90 day supply   losartan 25 MG tablet Commonly known as: COZAAR Take 1 tablet (25 mg total) by mouth daily. Please fill as a 90 day supply   metFORMIN 1000 MG tablet Commonly known as: GLUCOPHAGE Take 1 tablet (1,000 mg total) by mouth 2 (two) times daily with a meal. Please fill as a 90 day supply   Misc. Devices Misc Please provide BiPAP machine with the following: Settings 22/18 cm H2O. Medium  size [employer] Full Face Mask Simplus mask and heated  humidification.   Misc. Devices Misc Please provide patient a Blood Pressure Monitor w/  insurance approval.   risperidone 4 MG tablet Commonly known as: RISPERDAL Take 1 tablet (4 mg total) by mouth daily. Please fill as a 90 day supply   rivaroxaban 20 MG Tabs tablet Commonly known as: Xarelto Take 1 tablet (20 mg total) by mouth daily with supper. Please fill as a 90 day supply   solifenacin 10 MG tablet Commonly known as: VESICARE Take 1 tablet (10 mg total) by mouth daily. Please fill as a 90 day supply   spironolactone 25 MG tablet Commonly known as: ALDACTONE Take 1 tablet (25 mg total) by mouth daily. Start taking on: November 16, 2021   torsemide 20 MG tablet Commonly known as: DEMADEX Take 4 tablets (80 mg total) by mouth 2 (two) times daily.   Trulicity 1.5 OI/7.1IW Sopn Generic drug: Dulaglutide INJECT 1.5 MG INTO THE SKIN ONCE A WEEK. What changed: when to take this           Outstanding Labs/Studies   BMP at follow up   Duration of Discharge Encounter   Greater than 30 minutes including physician time.  Mahalia Longest [place], [place] 11/15/2021, 11:28 AM  Cardiology Attending  Patient seen and examined. Agree with the findings as noted above. The patient is doing well and I stable for DC home. Her exam is as noted above. Agree with meds and plan for followup.  Carleene Overlie Andilyn Bettcher,MD

## 2021-11-17 ENCOUNTER — Other Ambulatory Visit: Payer: Self-pay

## 2021-11-17 ENCOUNTER — Telehealth: Payer: Self-pay | Admitting: *Deleted

## 2021-11-17 ENCOUNTER — Telehealth: Payer: Self-pay

## 2021-11-17 ENCOUNTER — Telehealth (HOSPITAL_COMMUNITY): Payer: Self-pay

## 2021-11-17 DIAGNOSIS — E1165 Type 2 diabetes mellitus with hyperglycemia: Secondary | ICD-10-CM

## 2021-11-17 DIAGNOSIS — E119 Type 2 diabetes mellitus without complications: Secondary | ICD-10-CM

## 2021-11-17 DIAGNOSIS — I5032 Chronic diastolic (congestive) heart failure: Secondary | ICD-10-CM

## 2021-11-17 DIAGNOSIS — F419 Anxiety disorder, unspecified: Secondary | ICD-10-CM

## 2021-11-17 DIAGNOSIS — F32A Depression, unspecified: Secondary | ICD-10-CM

## 2021-11-17 DIAGNOSIS — N393 Stress incontinence (female) (male): Secondary | ICD-10-CM

## 2021-11-17 NOTE — Telephone Encounter (Signed)
Transition Care Management Follow-up Telephone Call  Call completed with patient's mother, Nowar Date of discharge and from where: 11/15/2021, [facility] How have you been since you were released from the hospital? She said her daughter is feeling all right Any questions or concerns? Yes- the patient and her mother are leaving on 11/21/2021 for a 3-4 month trip to [place].  She said that the patient does not have any medications and they need to get " everything " refilled for 90 days supply.  I reviewed the medication list with her and informed her what meds listed on AVS were sent to [place].  Nowar said she would go today and get those. Instructed her to call this CM back if she is not able to obtain what has been ordered.  These medications need refills for 90 day supply sent to [facility]: escitalopram, glipizide, metformin, risperidone, solifenacin, spironolactone, trulicity. She has a glucometer but it  is not working. Has a working scale and BP monitor but does not weigh herself.  The CPAP machine is not working either and she is not able to get to [employer] to have [place] check it.  Provided her with the phone number for [facility] office and instructed her to call to request to have the CPAP machine checked.  Items Reviewed: Did the pt receive and understand the discharge instructions provided? Yes  - Nowar said they have the instructions but she needs to review them . Medications obtained and verified? No  - concerns noted above.  Other? No  Any new allergies since your discharge? No  Dietary orders reviewed? Yes Do you have support at home? Yes   Home Care and Equipment/Supplies: Were home health services ordered? no If so, what is the name of the agency? N/a  Has the agency set up a time to come to the patient's home? not applicable Were any new equipment or medical supplies ordered?  No What is the name of the medical supply agency? N/a Were  you able to get the supplies/equipment? not applicable Do you have any questions related to the use of the equipment or supplies? No  Functional Questionnaire: (I = Independent and D = Dependent) ADLs: independent   Follow up appointments reviewed:  PCP Hospital f/u appt confirmed?  Patient will call to schedule an appointment after she returns from her trip.  [facility] f/u appt confirmed?  She will need to cancel her appointment with heart and vascular 11/25/2021 because she will not be here.    Are transportation arrangements needed? No  If their condition worsens, is the pt aware to call PCP or go to the Emergency Dept.? Yes Was the patient provided with contact information for the PCP's office or ED? Yes Was to pt encouraged to call back with questions or concerns? Yes

## 2021-11-17 NOTE — Telephone Encounter (Signed)
Transition Care Management Unsuccessful Follow-up Telephone Call  Date of discharge and from where:  11/15/2021 - [facility]  Attempts:  1st Attempt  Reason for unsuccessful TCM follow-up call:  Left voice message

## 2021-11-17 NOTE — Telephone Encounter (Signed)
Heart Failure Nurse Navigator Progress Note  Received message regarding change to HV TOC schedule conflict. Spoke with patient and rescheudled appt for tomorrow 2/21 @ 11AM for HV TOC. Pt will be leaving for extended stay in [place] Friday 2/24-June.   Inquired about medications to last her the trip, pt states she is trying to get that arranged.   Gave directions to H[facility]V Center. Confirmed appt prior to ending call.   Pricilla Holm, MSN, RN Heart Failure Nurse Navigator [phone]

## 2021-11-18 ENCOUNTER — Other Ambulatory Visit: Payer: Self-pay

## 2021-11-18 ENCOUNTER — Other Ambulatory Visit: Payer: Self-pay | Admitting: Nurse Practitioner

## 2021-11-18 ENCOUNTER — Other Ambulatory Visit: Payer: Self-pay | Admitting: Physician Assistant

## 2021-11-18 ENCOUNTER — Encounter (HOSPITAL_COMMUNITY): Payer: Self-pay

## 2021-11-18 ENCOUNTER — Other Ambulatory Visit: Payer: Self-pay | Admitting: Family Medicine

## 2021-11-18 ENCOUNTER — Ambulatory Visit (HOSPITAL_COMMUNITY)
Admission: RE | Admit: 2021-11-18 | Discharge: 2021-11-18 | Disposition: A | Discharge status: Home / Self Care | Payer: Medicaid Other | Source: Ambulatory Visit | Attending: Cardiology | Admitting: Cardiology

## 2021-11-18 VITALS — BP 130/80 | HR 82 | Wt 332.2 lb

## 2021-11-18 DIAGNOSIS — Z7984 Long term (current) use of oral hypoglycemic drugs: Secondary | ICD-10-CM | POA: Insufficient documentation

## 2021-11-18 DIAGNOSIS — I5022 Chronic systolic (congestive) heart failure: Secondary | ICD-10-CM | POA: Diagnosis not present

## 2021-11-18 DIAGNOSIS — I5032 Chronic diastolic (congestive) heart failure: Secondary | ICD-10-CM | POA: Diagnosis not present

## 2021-11-18 DIAGNOSIS — I110 Hypertensive heart disease with heart failure: Secondary | ICD-10-CM | POA: Diagnosis not present

## 2021-11-18 DIAGNOSIS — I482 Chronic atrial fibrillation, unspecified: Secondary | ICD-10-CM | POA: Diagnosis not present

## 2021-11-18 DIAGNOSIS — G4733 Obstructive sleep apnea (adult) (pediatric): Secondary | ICD-10-CM | POA: Diagnosis not present

## 2021-11-18 DIAGNOSIS — Z6841 Body Mass Index (BMI) 40.0 and over, adult: Secondary | ICD-10-CM | POA: Diagnosis not present

## 2021-11-18 DIAGNOSIS — I4819 Other persistent atrial fibrillation: Secondary | ICD-10-CM | POA: Diagnosis not present

## 2021-11-18 DIAGNOSIS — Z79899 Other long term (current) drug therapy: Secondary | ICD-10-CM | POA: Insufficient documentation

## 2021-11-18 DIAGNOSIS — Z7901 Long term (current) use of anticoagulants: Secondary | ICD-10-CM | POA: Insufficient documentation

## 2021-11-18 DIAGNOSIS — E1165 Type 2 diabetes mellitus with hyperglycemia: Secondary | ICD-10-CM

## 2021-11-18 LAB — BRAIN NATRIURETIC PEPTIDE: B Natriuretic Peptide: 213 pg/mL — ABNORMAL HIGH (ref 0.0–100.0)

## 2021-11-18 MED ORDER — ESCITALOPRAM OXALATE 20 MG PO TABS
20.0000 mg | ORAL_TABLET | Freq: Every morning | ORAL | 0 refills | Status: DC
  Filled 2021-11-18 (×2): qty 90, 90d supply, fill #0

## 2021-11-18 MED ORDER — DULAGLUTIDE 1.5 MG/0.5ML ~~LOC~~ SOAJ
1.5000 mg | SUBCUTANEOUS | 0 refills | Status: DC
  Filled 2021-11-18: qty 6, 84d supply, fill #0

## 2021-11-18 MED ORDER — GLIPIZIDE ER 10 MG PO TB24
10.0000 mg | ORAL_TABLET | Freq: Every day | ORAL | 0 refills | Status: DC
  Filled 2021-11-18: qty 90, 90d supply, fill #0

## 2021-11-18 MED ORDER — SOLIFENACIN SUCCINATE 10 MG PO TABS
10.0000 mg | ORAL_TABLET | Freq: Every day | ORAL | 0 refills | Status: DC
  Filled 2021-11-18: qty 90, 90d supply, fill #0

## 2021-11-18 MED ORDER — SPIRONOLACTONE 25 MG PO TABS: 25.0000 mg | ORAL_TABLET | Freq: Every day | ORAL | 3 refills | Status: DC

## 2021-11-18 MED ORDER — RISPERIDONE 4 MG PO TABS
4.0000 mg | ORAL_TABLET | Freq: Every day | ORAL | 0 refills | Status: DC
  Filled 2021-11-18: qty 90, 90d supply, fill #0

## 2021-11-18 MED ORDER — METFORMIN HCL 1000 MG PO TABS: 1000.0000 mg | ORAL_TABLET | Freq: Two times a day (BID) | ORAL | 0 refills | Status: DC

## 2021-11-18 NOTE — Progress Notes (Signed)
HEART & VASCULAR TRANSITION OF CARE CONSULT NOTE     Referring Physician:  Primary Care: [name], NP Primary Cardiologist: [name], MD   HPI: Referred to clinic by Dr. Lovena Le for heart failure consultation.   56 y/o female, followed by Dr. [name] w/ chronic diastolic heart failure, persistent atrial fibrillation on Xarelto, hypertension, obesity and OSA, not compliant w/ CPAP. 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  Did have VFib with heart cath, treated w/ defibrillation. Last echo 04/18/21 with EF 60-65%, no RWMA, severe LVH, RV normal with moderately elevated PA systolic pressure, Lt atrial size severely dilated and RA mildly dilated.  Mild MR, mod to severe TR.   Recently admitted for a/c diastolic CHF w/ fluid overload. Afib well rate controlled. Diuresed w/ IV Lasix. Diuresed 8L down to 328 lb. Transitioned back to PO diuretics, torsemide 80 mg bid. Also on Sprio 25 mg daily + Jardiance 10 mg daily.   She presents to [facility] clinic today for hospital f/u. Wt up slightly by 4 lb from 328>>332 lb. Continues w/ NYHA Class II-III symptoms, confounded by super morbid obesity and deconditioning. No LEE on exam. Denies CP. HR well controlled in the 80s. BP well controlled 130/80. Reports full med compliance. Plans to travel out of the country for the next 3 months to [place]. She is leaving this week. She was given 90 supply of meds at hospital d/c.     Cardiac Testing  2D Echo 04/18/21  1. Left ventricular ejection fraction, by estimation, is 60 to 65%. The left ventricle has normal function. The left ventricle has no regional wall motion abnormalities. There is severe left ventricular hypertrophy. Left ventricular diastolic function could not be evaluated. 2. Right ventricular systolic function is normal. The right ventricular size  is normal. There is moderately elevated pulmonary artery systolic pressure. 3. Left atrial size was severely dilated. 4. Right atrial size was mildly dilated. 5. The mitral valve is normal in structure. Mild mitral valve regurgitation. No evidence of mitral stenosis. 6. Tricuspid valve regurgitation is moderate to severe. 7. The aortic valve is tricuspid. Aortic valve regurgitation is trivial. No aortic stenosis is present. 8. The inferior vena cava is dilated in size with >50% respiratory variability, suggesting right atrial pressure of 8 mmHg.  Review of Systems: [y] = yes, '[ ]'  = no   General: Weight gain '[ ]' ; Weight loss '[ ]' ; Anorexia '[ ]' ; Fatigue '[ ]' ; Fever '[ ]' ; Chills '[ ]' ; Weakness '[ ]'   Cardiac: Chest pain/pressure '[ ]' ; Resting SOB '[ ]' ; Exertional SOB [ Y]; Orthopnea '[ ]' ; Pedal Edema '[ ]' ; Palpitations '[ ]' ; Syncope '[ ]' ; Presyncope '[ ]' ; Paroxysmal nocturnal dyspnea'[ ]'   Pulmonary: Cough '[ ]' ; Wheezing'[ ]' ; Hemoptysis'[ ]' ; Sputum '[ ]' ; Snoring '[ ]'   GI: Vomiting'[ ]' ; Dysphagia'[ ]' ; Melena'[ ]' ; Hematochezia '[ ]' ; Heartburn'[ ]' ; Abdominal pain '[ ]' ; Constipation '[ ]' ; Diarrhea '[ ]' ; BRBPR '[ ]'   GU: Hematuria'[ ]' ; Dysuria '[ ]' ; Nocturia'[ ]'   Vascular: Pain in legs with walking '[ ]' ; Pain in feet with lying flat '[ ]' ; Non-healing sores '[ ]' ; Stroke '[ ]' ; TIA '[ ]' ; Slurred speech '[ ]' ;  Neuro: Headaches'[ ]' ; Vertigo'[ ]' ; Seizures'[ ]' ; Paresthesias'[ ]' ;Blurred vision '[ ]' ; Diplopia '[ ]' ; Vision changes '[ ]'   Ortho/Skin: Arthritis '[ ]' ; Joint pain '[ ]' ;  Muscle pain '[ ]' ; Joint swelling '[ ]' ; Back Pain '[ ]' ; Rash '[ ]'   Psych: Depression'[ ]' ; Anxiety'[ ]'   Heme: Bleeding problems '[ ]' ; Clotting disorders '[ ]' ; Anemia '[ ]'   Endocrine: Diabetes '[ ]' ; Thyroid dysfunction'[ ]'    Past Medical History:  Diagnosis Date   Acute on chronic diastolic congestive heart failure ([place]) 11/02/2013   10/03/2015, 11/13/2015, 08/03/2017   Benign essential HTN 11/28/2013   Bipolar disease, chronic ([place])    Chest pain    a. 2012 Myoview: EF 63%, no  isch/infarct;  b. 04/2016 Lexiscan MV: EF 73%, no ischemia/infarct-->Low risk.   Chronic diastolic CHF (congestive heart failure) ([place]) 07/23/2011   a. 2015 Echo: EF 55-60%, Gr2 DD;  b. 09/2015 Echo: EF 60-65%, no rwma, mod dil LA, PASP 51mHg.   Cor pulmonale (chronic) (HCC)    History of thyrotoxicosis    HTN (hypertension) 11/28/2013   Hypertensive heart disease 10/18/2013   Hypoglycemia    Insulin dependent type 2 diabetes mellitus, uncontrolled    Mediastinal adenopathy    Morbid obesity due to excess calories ([facility] 02/19/2011   Morbid obesity with BMI of 50.0-59.9, adult (HCC)    OSA (obstructive sleep apnea) 03/06/2011   Persistent atrial fibrillation ([facility] 12/09/2017   Pulmonary HTN, moderate to severe 11/03/2013   Sinusitis, chronic 01/02/2015   SVT (supraventricular tachycardia) ([facility] 12/06/2013   Uncontrolled type 2 diabetes mellitus with hyperglycemia (HCC)     Current Outpatient Medications  Medication Sig Dispense Refill   amLODipine (NORVASC) 10 MG tablet Take 1 tablet (10 mg total) by mouth daily. Please fill as a 90 day supply 90 tablet 3   Blood Glucose Monitoring Suppl (ACCU-CHEK GUIDE) w/Device KIT Use to check blood sugar three times daily. 1 kit 0   Blood Pressure Monitor DEVI Please provide patient with insurance approved blood pressure monitor I10.0 1 each 0   carvedilol (COREG) 25 MG tablet Take 1 tablet (25 mg total) by mouth 2 (two) times daily with a meal. Please fill as a 90 day supply 180 tablet 1   Dulaglutide 1.5 MG/0.5ML SOPN INJECT 1.5 MG INTO THE SKIN ONCE A WEEK. 6 mL 3   empagliflozin (JARDIANCE) 10 MG TABS tablet Take 1 tablet (10 mg total) by mouth daily. Please fill as a 90 day supply 90 tablet 1   glipiZIDE (GLUCOTROL XL) 10 MG 24 hr tablet Take 1 tablet (10 mg total) by mouth daily with breakfast. Please fill as a 90 day supply 90 tablet 1   glucose blood test strip USE AS DIRECTED TO TEST BLOOD SUGAR THREE TIMES DAILY 200 strip 12   hydrALAZINE (APRESOLINE)  25 MG tablet Take 1 tablet (25 mg total) by mouth every 8 (eight) hours. Please fill as a 90 day supply 270 tablet 1   Incontinence Supply Disposable (INCONTINENCE BRIEF LARGE) MISC Please provide patient with insurance approved incontinence supplies/briefs 18 each 6   losartan (COZAAR) 25 MG tablet Take 1 tablet (25 mg total) by mouth daily. Please fill as a 90 day supply 90 tablet 1   metFORMIN (GLUCOPHAGE) 1000 MG tablet Take 1 tablet (1,000 mg total) by mouth 2 (two) times daily with a meal. Please fill as a 90 day supply 180 tablet 1   Misc. Devices MISC Please provide BiPAP machine with the following: Settings 22/18 cm H2O. Medium  size [employer] Full Face Mask Simplus mask and heated  humidification. 1 each 0   Misc. Devices MISC Please provide patient a  Blood Pressure Monitor w/ insurance approval. 1 Device 0   Naphazoline-Pheniramine (EYE ALLERGY RELIEF OP) Place 1 drop into both eyes daily as needed (allergy).     risperidone (RISPERDAL) 4 MG tablet Take 1 tablet (4 mg total) by mouth daily. Please fill as a 90 day supply 90 tablet 1   rivaroxaban (XARELTO) 20 MG TABS tablet Take 1 tablet (20 mg total) by mouth daily with supper. Please fill as a 90 day supply 90 tablet 1   torsemide (DEMADEX) 20 MG tablet Take 4 tablets (80 mg total) by mouth 2 (two) times daily. 720 tablet 1   escitalopram (LEXAPRO) 20 MG tablet Take 1 tablet (20 mg total) by mouth every morning. Please fill as a 90 day supply (Patient not taking: Reported on 11/10/2021) 90 tablet 2   hydrOXYzine (ATARAX/VISTARIL) 25 MG tablet Take 1 tablet (25 mg total) by mouth 3 (three) times daily as needed for itching. (Patient not taking: Reported on 11/10/2021) 60 tablet 0   solifenacin (VESICARE) 10 MG tablet Take 1 tablet (10 mg total) by mouth daily. Please fill as a 90 day supply (Patient not taking: Reported on 11/10/2021) 90 tablet 1   spironolactone (ALDACTONE) 25 MG tablet Take 1 tablet (25 mg total) by mouth daily.  (Patient not taking: Reported on 11/18/2021) 30 tablet 6   No current facility-administered medications for this encounter.    Allergies  Allergen Reactions   Acetaminophen Other (See Comments)    Seizure-like "fits" as a child   Caffeine     Tense, anxiety, increased urination   [place] [Dapagliflozin] Other (See Comments)    Hallucinations, drop in blood sugar   Lisinopril Rash    Rash with lisinopril; but fosinopril is ok per patient      Social History   Socioeconomic History   Marital status: Single    Spouse name: Not on file   Number of children: 0   Years of education: 18   Highest education level: Not on file  Occupational History   Occupation: unemployed  Tobacco Use   Smoking status: Never   Smokeless tobacco: Never  Vaping Use   Vaping Use: Never used  Substance and Sexual Activity   Alcohol use: No   Drug use: No   Sexual activity: Not Currently    Birth control/protection: None  Other Topics Concern   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 But ran out of money and is no longer attending school. (Note patient has bipolar disorder).      Born in [place] but lived in [place] before coming back to [place].       Primary language is Arabic. Lives with mother and brother.   Social Determinants of Health   Financial Resource Strain: Not on file  Food Insecurity: Not on file  Transportation Needs: Not on file  Physical Activity: Not on file  Stress: Not on file  Social Connections: Not on file  Intimate Partner Violence: Not on file      Family History  Problem Relation Age of Onset   Heart failure Father    Stroke Father    Hypertension Mother    Heart disease Maternal Grandfather     Vitals:   11/18/21 1045  BP: 130/80  Pulse: 82  SpO2: 98%  Weight: (!) 150.7 kg (332 lb 3.2 oz)  PHYSICAL EXAM: General:  Well appearing, obese.  No respiratory difficulty HEENT: normal Neck: supple. no JVD. Carotids 2+ bilat; no bruits. No lymphadenopathy or thryomegaly appreciated. Cor: PMI nondisplaced. Irregularly irregular & rhythm rate. + TR murmur  Lungs: clear Abdomen: obese, soft, nontender, nondistended. No hepatosplenomegaly. No bruits or masses. Good bowel sounds. Extremities: no cyanosis, clubbing, rash, edema Neuro: alert & oriented x 3, cranial nerves grossly intact. moves all 4 extremities w/o difficulty. Affect pleasant.  ECG: not performed    ASSESSMENT & PLAN:  1. Chronic Diastolic Heart Failure  - most recent echo 7/22 EF 60-65%, severe LVH, RV mildly dilated normal systolic function  - Wt slightly up 4 lb post d/c but hasn't started spiro yet. Plans to pick up from pharmacy today  - NYHA Class II-III, chronic, also confounded by obesity/deconditioning and likely OHS/ non compliant w/ CPAP  - continue torsemide 80 mg tid. Can take extra torsemide PRN if wt gain - continue spiro 25 mg daily  - continue Jardiance 10 mg daily  - BP controlled. Continue antihypertensive regimen - Advised that she adhere to a low sodium diet and continue daily wts  - f/u w/ Dr. [name] when she returns from her out of country trip   2. Hypertension  - controlled on current regimen - BMP today   3. Chronic Afib - well rate controlled  - continue Coreg - on xarelto for a/c   4. OSA - non compliant w/ CPAP   5. Morbid Obesity -Body mass index is 52.03 kg/m.  6. Type 2D  - hgb A1c 6.2%  - continue Jardiance   NYHA II-III  GDMT  Diuretic- torsemide 80 mg bid  BB- Coreg 25 mg bid  Ace/ARB/ARNI Losartan 25 mg daily  MRA Spiro 25 mg daily  SGLT2i Jardiance 10 mg daily     Referred to HFSW (PCP, Medications, Transportation, ETOH Abuse, Drug Abuse, Insurance, Museum/gallery curator ):  No Refer to Pharmacy:  No Refer to Home Health:  No Refer to Advanced Heart Failure Clinic: No  Refer to General Cardiology: Yes (back to Dr.  [name])   Follow up  w/ Dr. [name] when she returns from out of country trip.

## 2021-11-18 NOTE — Telephone Encounter (Signed)
Transition Care Management Unsuccessful Follow-up Telephone Call  Date of discharge and from where:  11/15/2021 - [facility]  Attempts:  2nd Attempt  Reason for unsuccessful TCM follow-up call:  Left voice message

## 2021-11-18 NOTE — Telephone Encounter (Signed)
Requested medications are due for refill today.  unsure  Requested medications are on the active medications list.  yes  Last refill. 11/15/2021 #720 1 refill  Future visit scheduled.   no  Notes to clinic.  Per request rx was refilled by PA on 11/15/2021.    Requested Prescriptions  Pending Prescriptions Disp Refills   torsemide (DEMADEX) 20 MG tablet 720 tablet 1    Sig: Take 4 tablets (80 mg total) by mouth 2 (two) times daily.     Cardiovascular:  Diuretics - Loop Failed - 11/18/2021 11:54 AM      Failed - Cr in normal range and within 180 days    Creat  Date Value Ref Range Status  08/06/2016 0.81 0.50 - 1.10 mg/dL Final   Creatinine, Ser  Date Value Ref Range Status  11/15/2021 1.13 (H) 0.44 - 1.00 mg/dL Final   Creatinine, Urine  Date Value Ref Range Status  08/06/2018 197.25 mg/dL Final    Comment:    Performed at [facility], [place] [address], [place], [place] [address]          Passed - K in normal range and within 180 days    Potassium  Date Value Ref Range Status  11/15/2021 4.3 3.5 - 5.1 mmol/L Final          Passed - Ca in normal range and within 180 days    Calcium  Date Value Ref Range Status  11/15/2021 9.0 8.9 - 10.3 mg/dL Final   Calcium, Ion  Date Value Ref Range Status  08/07/2020 1.02 (L) 1.15 - 1.40 mmol/L Final          Passed - Na in normal range and within 180 days    Sodium  Date Value Ref Range Status  11/15/2021 139 135 - 145 mmol/L Final  08/20/2021 137 134 - 144 mmol/L Final          Passed - Cl in normal range and within 180 days    Chloride  Date Value Ref Range Status  11/15/2021 104 98 - 111 mmol/L Final          Passed - Mg Level in normal range and within 180 days    Magnesium  Date Value Ref Range Status  11/10/2021 2.2 1.7 - 2.4 mg/dL Final    Comment:    Performed at [facility], [place] [address] [place], [place] [address]          Passed - Last BP in normal range    BP Readings  from Last 1 Encounters:  11/18/21 130/80          Passed - Valid encounter within last 6 months    Recent Outpatient Visits           3 months ago Hypertensive heart disease with chronic diastolic congestive heart failure [facility])   [place], Vernia Buff, NP   5 months ago Hospital discharge follow-up   [place] [name], NP   10 months ago Thumb pain, right   [place], [place], [place]   1 year ago Uncontrolled type 2 diabetes mellitus with hyperglycemia [facility])   [place], Stephen L, RPH-CPP   1 year ago Uncontrolled type 2 diabetes mellitus with hyperglycemia [facility])   [place], RPH-CPP

## 2021-11-18 NOTE — Telephone Encounter (Signed)
Requested medication (s) are due for refill today: no  Requested medication (s) are on the active medication list: yes  Last refill:  11/15/21  Future visit scheduled: no  Notes to clinic:  pt requesting medications be sent to different pharmacy but was refilled by another provider.      Requested Prescriptions  Pending Prescriptions Disp Refills   glipiZIDE (GLIPIZIDE XL) 10 MG 24 hr tablet 90 tablet 1    Sig: TAKE 1 TABLET (10 MG TOTAL) BY MOUTH EVERY EVENING.     Endocrinology:  Diabetes - Sulfonylureas Failed - 11/18/2021 11:54 AM      Failed - Cr in normal range and within 360 days    Creat  Date Value Ref Range Status  08/06/2016 0.81 0.50 - 1.10 mg/dL Final   Creatinine, Ser  Date Value Ref Range Status  11/15/2021 1.13 (H) 0.44 - 1.00 mg/dL Final   Creatinine, Urine  Date Value Ref Range Status  08/06/2018 197.25 mg/dL Final    Comment:    Performed at [facility], [place] [address], [place], [place] [address]          Passed - HBA1C is between 0 and 7.9 and within 180 days    HbA1c, POC (controlled diabetic range)  Date Value Ref Range Status  09/16/2018 7.1 (A) 0.0 - 7.0 % Final   Hgb A1c MFr Bld  Date Value Ref Range Status  11/11/2021 6.2 (H) 4.8 - 5.6 % Final    Comment:    (NOTE) Pre diabetes:          5.7%-6.4%  Diabetes:              >6.4%  Glycemic control for   <7.0% adults with diabetes           Passed - Valid encounter within last 6 months    Recent Outpatient Visits           3 months ago Hypertensive heart disease with chronic diastolic congestive heart failure [facility])   [place], Vernia Buff, NP   5 months ago Hospital discharge follow-up   [place], [place] W, NP   10 months ago Thumb pain, right   [place], [place], [place]   1 year ago Uncontrolled type 2 diabetes mellitus with hyperglycemia [facility])    [place], Jarome Matin, RPH-CPP   1 year ago Uncontrolled type 2 diabetes mellitus with hyperglycemia [facility])   [place], Stephen L, RPH-CPP               empagliflozin (JARDIANCE) 10 MG TABS tablet 90 tablet 1    Sig: Take 1 tablet (10 mg total) by mouth daily. Please fill as a 90 day supply     Endocrinology:  Diabetes - SGLT2 Inhibitors Failed - 11/18/2021 11:54 AM      Failed - Cr in normal range and within 360 days    Creat  Date Value Ref Range Status  08/06/2016 0.81 0.50 - 1.10 mg/dL Final   Creatinine, Ser  Date Value Ref Range Status  11/15/2021 1.13 (H) 0.44 - 1.00 mg/dL Final   Creatinine, Urine  Date Value Ref Range Status  08/06/2018 197.25 mg/dL Final    Comment:    Performed at [facility], [place] [address], [place], [place] [address]  Failed - eGFR in normal range and within 360 days    GFR, Est African American  Date Value Ref Range Status  08/06/2016 >89 >=60 mL/min Final   GFR calc Af Amer  Date Value Ref Range Status  09/17/2020 77 >59 mL/min/1.73 Final    Comment:    **In accordance with recommendations from the NKF-ASN Task force,**   [facility] is in the process of updating its eGFR calculation to the   2021 CKD-EPI creatinine equation that estimates kidney function   without a race variable.    GFR, Est Non African American  Date Value Ref Range Status  08/06/2016 86 >=60 mL/min Final   GFR, Estimated  Date Value Ref Range Status  11/15/2021 58 (L) >60 mL/min Final    Comment:    (NOTE) Calculated using the CKD-EPI Creatinine Equation (2021)    GFR  Date Value Ref Range Status  12/25/2013 117.88 >60.00 mL/min Final   eGFR  Date Value Ref Range Status  08/20/2021 64 >59 mL/min/1.73 Final          Passed - HBA1C is between 0 and 7.9 and within 180 days    HbA1c, POC (controlled diabetic range)  Date Value Ref Range  Status  09/16/2018 7.1 (A) 0.0 - 7.0 % Final   Hgb A1c MFr Bld  Date Value Ref Range Status  11/11/2021 6.2 (H) 4.8 - 5.6 % Final    Comment:    (NOTE) Pre diabetes:          5.7%-6.4%  Diabetes:              >6.4%  Glycemic control for   <7.0% adults with diabetes           Passed - Valid encounter within last 6 months    Recent Outpatient Visits           3 months ago Hypertensive heart disease with chronic diastolic congestive heart failure [facility])   [place], Vernia Buff, NP   5 months ago Hospital discharge follow-up   [place], Vernia Buff, NP   10 months ago Thumb pain, right   [place], [place], [place]   1 year ago Uncontrolled type 2 diabetes mellitus with hyperglycemia [facility])   [place], Jarome Matin, RPH-CPP   1 year ago Uncontrolled type 2 diabetes mellitus with hyperglycemia [facility])   [place], Stephen L, RPH-CPP               hydrALAZINE (APRESOLINE) 25 MG tablet 270 tablet 1    Sig: Take 1 tablet (25 mg total) by mouth every 8 (eight) hours. Please fill as a 90 day supply     Cardiovascular:  Vasodilators Failed - 11/18/2021 11:54 AM      Failed - HCT in normal range and within 360 days    HCT  Date Value Ref Range Status  11/13/2021 34.5 (L) 36.0 - 46.0 % Final   Hematocrit  Date Value Ref Range Status  08/20/2021 39.8 34.0 - 46.6 % Final          Failed - HGB in normal range and within 360 days    Hemoglobin  Date Value Ref Range Status  11/13/2021 10.6 (L) 12.0 - 15.0 g/dL Final  08/20/2021 12.5 11.1 - 15.9 g/dL Final  Failed - ANA Screen, Ifa, Serum in normal range and within 360 days    ANA Ab, IFA  Date Value Ref Range Status  11/12/2015 Negative  Final    Comment:    (NOTE)                                     Negative   <1:80                                      Borderline  1:80                                     Positive   >1:80 Performed At: [facility] [place], [place] [id number] Lindon Romp MD HQ:[id number]           Passed - RBC in normal range and within 360 days    RBC  Date Value Ref Range Status  11/13/2021 4.04 3.87 - 5.11 MIL/uL Final          Passed - WBC in normal range and within 360 days    WBC  Date Value Ref Range Status  11/13/2021 4.1 4.0 - 10.5 K/uL Final          Passed - PLT in normal range and within 360 days    Platelets  Date Value Ref Range Status  11/13/2021 190 150 - 400 K/uL Final  08/20/2021 200 150 - 450 x10E3/uL Final          Passed - Last BP in normal range    BP Readings from Last 1 Encounters:  11/18/21 130/80          Passed - Valid encounter within last 12 months    Recent Outpatient Visits           3 months ago Hypertensive heart disease with chronic diastolic congestive heart failure [facility])   [place], Vernia Buff, NP   5 months ago Hospital discharge follow-up   [place], [place] W, NP   10 months ago Thumb pain, right   [place], [place], [place]   1 year ago Uncontrolled type 2 diabetes mellitus with hyperglycemia [facility])   [place], Jarome Matin, RPH-CPP   1 year ago Uncontrolled type 2 diabetes mellitus with hyperglycemia [facility])   [place], Stephen L, RPH-CPP               losartan (COZAAR) 25 MG tablet 90 tablet 1    Sig: Take 1 tablet (25 mg total) by mouth daily. Please fill as a 90 day supply     Cardiovascular:  Angiotensin Receptor Blockers Failed - 11/18/2021 11:54 AM      Failed - Cr in normal range and within 180 days    Creat  Date Value Ref Range Status  08/06/2016 0.81 0.50 - 1.10 mg/dL Final    Creatinine, Ser  Date Value Ref Range Status  11/15/2021 1.13 (H) 0.44 - 1.00 mg/dL Final   Creatinine, Urine  Date Value Ref Range Status  08/06/2018 197.25 mg/dL Final    Comment:  Performed at [facility], [place] [address] [place], [place] [address]          Passed - K in normal range and within 180 days    Potassium  Date Value Ref Range Status  11/15/2021 4.3 3.5 - 5.1 mmol/L Final          Passed - Patient is not pregnant      Passed - Last BP in normal range    BP Readings from Last 1 Encounters:  11/18/21 130/80          Passed - Valid encounter within last 6 months    Recent Outpatient Visits           3 months ago Hypertensive heart disease with chronic diastolic congestive heart failure [facility])   [place], Vernia Buff, NP   5 months ago Hospital discharge follow-up   [place], [place] W, NP   10 months ago Thumb pain, right   [place], [place], [place]   1 year ago Uncontrolled type 2 diabetes mellitus with hyperglycemia [facility])   [place], Jarome Matin, RPH-CPP   1 year ago Uncontrolled type 2 diabetes mellitus with hyperglycemia [facility])   [place], Jarome Matin, RPH-CPP               rivaroxaban (XARELTO) 20 MG TABS tablet 90 tablet 1    Sig: Take 1 tablet (20 mg total) by mouth daily with supper. Please fill as a 90 day supply     Hematology: Anticoagulants - rivaroxaban Failed - 11/18/2021 11:54 AM      Failed - Cr in normal range and within 360 days    Creat  Date Value Ref Range Status  08/06/2016 0.81 0.50 - 1.10 mg/dL Final   Creatinine, Ser  Date Value Ref Range Status  11/15/2021 1.13 (H) 0.44 - 1.00 mg/dL Final   Creatinine, Urine  Date Value Ref Range Status  08/06/2018 197.25 mg/dL Final    Comment:    Performed at  [facility], [place] [address], [place], [place] [address]          Failed - HCT in normal range and within 360 days    HCT  Date Value Ref Range Status  11/13/2021 34.5 (L) 36.0 - 46.0 % Final   Hematocrit  Date Value Ref Range Status  08/20/2021 39.8 34.0 - 46.6 % Final          Failed - HGB in normal range and within 360 days    Hemoglobin  Date Value Ref Range Status  11/13/2021 10.6 (L) 12.0 - 15.0 g/dL Final  08/20/2021 12.5 11.1 - 15.9 g/dL Final          Passed - ALT in normal range and within 360 days    ALT  Date Value Ref Range Status  11/10/2021 16 0 - 44 U/L Final          Passed - AST in normal range and within 360 days    AST  Date Value Ref Range Status  11/10/2021 27 15 - 41 U/L Final          Passed - PLT in normal range and within 360 days    Platelets  Date Value Ref Range Status  11/13/2021 190 150 - 400 K/uL Final  08/20/2021 200  150 - 450 x10E3/uL Final          Passed - eGFR is 15 or above and within 360 days    GFR, Est African American  Date Value Ref Range Status  08/06/2016 >89 >=60 mL/min Final   GFR calc Af Amer  Date Value Ref Range Status  09/17/2020 77 >59 mL/min/1.73 Final    Comment:    **In accordance with recommendations from the NKF-ASN Task force,**   [facility] is in the process of updating its eGFR calculation to the   2021 CKD-EPI creatinine equation that estimates kidney function   without a race variable.    GFR, Est Non African American  Date Value Ref Range Status  08/06/2016 86 >=60 mL/min Final   GFR, Estimated  Date Value Ref Range Status  11/15/2021 58 (L) >60 mL/min Final    Comment:    (NOTE) Calculated using the CKD-EPI Creatinine Equation (2021)    GFR  Date Value Ref Range Status  12/25/2013 117.88 >60.00 mL/min Final   eGFR  Date Value Ref Range Status  08/20/2021 64 >59 mL/min/1.73 Final          Passed - Patient is not pregnant      Passed - Valid encounter  within last 12 months    Recent Outpatient Visits           3 months ago Hypertensive heart disease with chronic diastolic congestive heart failure [facility])   [place], Vernia Buff, NP   5 months ago Hospital discharge follow-up   [place] [name], NP   10 months ago Thumb pain, right   [place], [place], [place]   1 year ago Uncontrolled type 2 diabetes mellitus with hyperglycemia [facility])   [place], Jarome Matin, RPH-CPP   1 year ago Uncontrolled type 2 diabetes mellitus with hyperglycemia [facility])   [place], RPH-CPP

## 2021-11-18 NOTE — Telephone Encounter (Signed)
Rxs sent

## 2021-11-18 NOTE — Patient Instructions (Addendum)
Thank you for your visit today.   There has been no changes to your medications   Labs done today, your results will be available in MyChart, we will contact you for abnormal readings.   Thank you for allowing [place] to provider your heart failure care after your recent hospitalization. Please follow-up with Dr. [name] on your return from [place].

## 2021-11-19 ENCOUNTER — Other Ambulatory Visit: Payer: Self-pay | Admitting: Pharmacist

## 2021-11-19 ENCOUNTER — Other Ambulatory Visit: Payer: Self-pay

## 2021-11-19 DIAGNOSIS — N393 Stress incontinence (female) (male): Secondary | ICD-10-CM

## 2021-11-19 DIAGNOSIS — F419 Anxiety disorder, unspecified: Secondary | ICD-10-CM

## 2021-11-19 DIAGNOSIS — E1165 Type 2 diabetes mellitus with hyperglycemia: Secondary | ICD-10-CM

## 2021-11-19 DIAGNOSIS — F32A Depression, unspecified: Secondary | ICD-10-CM

## 2021-11-19 MED ORDER — DULAGLUTIDE 1.5 MG/0.5ML ~~LOC~~ SOAJ
1.5000 mg | SUBCUTANEOUS | 0 refills | Status: DC
  Filled 2021-11-19: qty 6, 84d supply, fill #0
  Filled 2022-06-17: qty 2, 28d supply, fill #0

## 2021-11-19 MED ORDER — SOLIFENACIN SUCCINATE 10 MG PO TABS
10.0000 mg | ORAL_TABLET | Freq: Every day | ORAL | 0 refills | Status: AC
  Filled 2021-11-19: qty 90, 90d supply, fill #0

## 2021-11-19 MED ORDER — METFORMIN HCL 1000 MG PO TABS
1000.0000 mg | ORAL_TABLET | Freq: Two times a day (BID) | ORAL | 0 refills | Status: DC
  Filled 2021-11-19: qty 180, 90d supply, fill #0

## 2021-11-19 MED ORDER — RISPERIDONE 4 MG PO TABS
4.0000 mg | ORAL_TABLET | Freq: Every day | ORAL | 0 refills | Status: DC
  Filled 2021-11-19: qty 90, 90d supply, fill #0

## 2021-11-19 NOTE — Telephone Encounter (Signed)
Attempted to contact patient and her mother to let her know that the prescriptions were sent to the pharmacy for the medications she requested. Messages left at # [phone] and [phone].

## 2021-11-20 ENCOUNTER — Other Ambulatory Visit: Payer: Self-pay

## 2021-11-20 NOTE — Telephone Encounter (Signed)
Call placed to patient's mother, [place], and informed her that the prescriptions for the medications she requested were sent to the pharmacy and to call this clinic back if she has any problems with the medications.

## 2021-11-20 NOTE — Telephone Encounter (Signed)
Transition Care Management Unsuccessful Follow-up Telephone Call  Date of discharge and from where:  11/15/2021 - [facility]  Attempts:  3rd Attempt  Reason for unsuccessful TCM follow-up call:  Left voice message

## 2021-11-21 ENCOUNTER — Other Ambulatory Visit: Payer: Self-pay

## 2021-11-25 ENCOUNTER — Encounter (HOSPITAL_COMMUNITY): Payer: Medicaid Other

## 2021-12-11 DIAGNOSIS — J849 Interstitial pulmonary disease, unspecified: Secondary | ICD-10-CM | POA: Diagnosis not present

## 2021-12-11 DIAGNOSIS — I2781 Cor pulmonale (chronic): Secondary | ICD-10-CM | POA: Diagnosis not present

## 2022-01-11 DIAGNOSIS — I2781 Cor pulmonale (chronic): Secondary | ICD-10-CM | POA: Diagnosis not present

## 2022-01-11 DIAGNOSIS — J849 Interstitial pulmonary disease, unspecified: Secondary | ICD-10-CM | POA: Diagnosis not present

## 2022-04-16 ENCOUNTER — Telehealth: Payer: Self-pay

## 2022-04-16 NOTE — Patient Outreach (Signed)
Care Coordination  04/16/2022  [name] 10/25/1965 [id number]   Medicaid Managed Care   Unsuccessful Outreach Note  04/16/2022 Name: [name] MRN: [id number] DOB: [birth year]  Referred by: [name], NP Reason for referral : High Risk Managed Medicaid ([employer])   An unsuccessful telephone outreach was attempted today. The patient was referred to the case management team for assistance with care management and care coordination.   Follow Up Plan: The care management team will reach out to the patient again over the next 7 days.   Mickel Fuchs, BSW, [place] Managed Medicaid Team  [phone]

## 2022-04-16 NOTE — Patient Instructions (Signed)
Visit Information  Ms. YSHUO [id number]  - as a part of your Medicaid benefit, you are eligible for care management and care coordination services at no cost or copay. I was unable to reach you by phone today but would be happy to help you with your health related needs. Please feel free to call me @ [phone]).   A member of the Managed Medicaid care management team will reach out to you again over the next 7 days.   Mickel Fuchs, BSW, [place] Managed Medicaid Team  [phone]

## 2022-05-25 ENCOUNTER — Observation Stay (HOSPITAL_COMMUNITY): Payer: Self-pay

## 2022-05-25 ENCOUNTER — Other Ambulatory Visit: Payer: Self-pay

## 2022-05-25 ENCOUNTER — Ambulatory Visit: Payer: Medicaid Other | Attending: Family Medicine | Admitting: Family Medicine

## 2022-05-25 ENCOUNTER — Inpatient Hospital Stay (HOSPITAL_COMMUNITY)
Admission: EM | Admit: 2022-05-25 | Discharge: 2022-05-28 | DRG: 291 | Disposition: A | Discharge status: Home / Self Care | Payer: Self-pay | Attending: Internal Medicine | Admitting: Internal Medicine

## 2022-05-25 ENCOUNTER — Encounter (HOSPITAL_COMMUNITY): Payer: Self-pay | Admitting: *Deleted

## 2022-05-25 ENCOUNTER — Emergency Department (HOSPITAL_COMMUNITY): Payer: Self-pay

## 2022-05-25 ENCOUNTER — Encounter: Payer: Self-pay | Admitting: Family Medicine

## 2022-05-25 VITALS — BP 194/130 | HR 104 | Temp 98.5°F | Wt 310.8 lb

## 2022-05-25 DIAGNOSIS — I5031 Acute diastolic (congestive) heart failure: Secondary | ICD-10-CM | POA: Diagnosis present

## 2022-05-25 DIAGNOSIS — Z823 Family history of stroke: Secondary | ICD-10-CM

## 2022-05-25 DIAGNOSIS — Z91199 Patient's noncompliance with other medical treatment and regimen due to unspecified reason: Secondary | ICD-10-CM

## 2022-05-25 DIAGNOSIS — Z7901 Long term (current) use of anticoagulants: Secondary | ICD-10-CM

## 2022-05-25 DIAGNOSIS — E119 Type 2 diabetes mellitus without complications: Secondary | ICD-10-CM

## 2022-05-25 DIAGNOSIS — E1165 Type 2 diabetes mellitus with hyperglycemia: Secondary | ICD-10-CM | POA: Diagnosis present

## 2022-05-25 DIAGNOSIS — E538 Deficiency of other specified B group vitamins: Secondary | ICD-10-CM | POA: Diagnosis not present

## 2022-05-25 DIAGNOSIS — F3162 Bipolar disorder, current episode mixed, moderate: Secondary | ICD-10-CM | POA: Diagnosis present

## 2022-05-25 DIAGNOSIS — I2729 Other secondary pulmonary hypertension: Secondary | ICD-10-CM | POA: Diagnosis present

## 2022-05-25 DIAGNOSIS — I472 Ventricular tachycardia, unspecified: Secondary | ICD-10-CM | POA: Diagnosis present

## 2022-05-25 DIAGNOSIS — Z91148 Patient's other noncompliance with medication regimen for other reason: Secondary | ICD-10-CM

## 2022-05-25 DIAGNOSIS — T503X6A Underdosing of electrolytic, caloric and water-balance agents, initial encounter: Secondary | ICD-10-CM | POA: Diagnosis present

## 2022-05-25 DIAGNOSIS — T465X6A Underdosing of other antihypertensive drugs, initial encounter: Secondary | ICD-10-CM | POA: Diagnosis present

## 2022-05-25 DIAGNOSIS — I5032 Chronic diastolic (congestive) heart failure: Secondary | ICD-10-CM

## 2022-05-25 DIAGNOSIS — I509 Heart failure, unspecified: Secondary | ICD-10-CM | POA: Insufficient documentation

## 2022-05-25 DIAGNOSIS — Z6841 Body Mass Index (BMI) 40.0 and over, adult: Secondary | ICD-10-CM

## 2022-05-25 DIAGNOSIS — R2689 Other abnormalities of gait and mobility: Secondary | ICD-10-CM | POA: Diagnosis not present

## 2022-05-25 DIAGNOSIS — T45516A Underdosing of anticoagulants, initial encounter: Secondary | ICD-10-CM | POA: Diagnosis present

## 2022-05-25 DIAGNOSIS — I110 Hypertensive heart disease with heart failure: Secondary | ICD-10-CM

## 2022-05-25 DIAGNOSIS — Z8249 Family history of ischemic heart disease and other diseases of the circulatory system: Secondary | ICD-10-CM

## 2022-05-25 DIAGNOSIS — I4819 Other persistent atrial fibrillation: Secondary | ICD-10-CM

## 2022-05-25 DIAGNOSIS — Z7985 Long-term (current) use of injectable non-insulin antidiabetic drugs: Secondary | ICD-10-CM

## 2022-05-25 DIAGNOSIS — F419 Anxiety disorder, unspecified: Secondary | ICD-10-CM

## 2022-05-25 DIAGNOSIS — T383X6A Underdosing of insulin and oral hypoglycemic [antidiabetic] drugs, initial encounter: Secondary | ICD-10-CM | POA: Diagnosis present

## 2022-05-25 DIAGNOSIS — Z7984 Long term (current) use of oral hypoglycemic drugs: Secondary | ICD-10-CM

## 2022-05-25 DIAGNOSIS — I10 Essential (primary) hypertension: Secondary | ICD-10-CM | POA: Diagnosis present

## 2022-05-25 DIAGNOSIS — I5033 Acute on chronic diastolic (congestive) heart failure: Secondary | ICD-10-CM | POA: Diagnosis present

## 2022-05-25 DIAGNOSIS — Z79899 Other long term (current) drug therapy: Secondary | ICD-10-CM

## 2022-05-25 DIAGNOSIS — E1169 Type 2 diabetes mellitus with other specified complication: Secondary | ICD-10-CM

## 2022-05-25 DIAGNOSIS — I3139 Other pericardial effusion (noninflammatory): Secondary | ICD-10-CM | POA: Diagnosis present

## 2022-05-25 DIAGNOSIS — E66813 Obesity, class 3: Secondary | ICD-10-CM

## 2022-05-25 DIAGNOSIS — N179 Acute kidney failure, unspecified: Secondary | ICD-10-CM | POA: Diagnosis present

## 2022-05-25 DIAGNOSIS — Z888 Allergy status to other drugs, medicaments and biological substances status: Secondary | ICD-10-CM

## 2022-05-25 DIAGNOSIS — Z560 Unemployment, unspecified: Secondary | ICD-10-CM

## 2022-05-25 DIAGNOSIS — I2781 Cor pulmonale (chronic): Secondary | ICD-10-CM | POA: Diagnosis present

## 2022-05-25 DIAGNOSIS — I5082 Biventricular heart failure: Secondary | ICD-10-CM | POA: Diagnosis present

## 2022-05-25 DIAGNOSIS — Z23 Encounter for immunization: Secondary | ICD-10-CM

## 2022-05-25 DIAGNOSIS — E876 Hypokalemia: Secondary | ICD-10-CM

## 2022-05-25 DIAGNOSIS — G4733 Obstructive sleep apnea (adult) (pediatric): Secondary | ICD-10-CM | POA: Diagnosis present

## 2022-05-25 DIAGNOSIS — T447X6A Underdosing of beta-adrenoreceptor antagonists, initial encounter: Secondary | ICD-10-CM | POA: Diagnosis present

## 2022-05-25 DIAGNOSIS — Z886 Allergy status to analgesic agent status: Secondary | ICD-10-CM

## 2022-05-25 DIAGNOSIS — R0902 Hypoxemia: Secondary | ICD-10-CM

## 2022-05-25 DIAGNOSIS — Z713 Dietary counseling and surveillance: Secondary | ICD-10-CM

## 2022-05-25 LAB — COMPREHENSIVE METABOLIC PANEL
ALT: 10 U/L (ref 0–44)
AST: 21 U/L (ref 15–41)
Albumin: 3.4 g/dL — ABNORMAL LOW (ref 3.5–5.0)
Alkaline Phosphatase: 111 U/L (ref 38–126)
Anion gap: 10 (ref 5–15)
BUN: 12 mg/dL (ref 6–20)
CO2: 23 mmol/L (ref 22–32)
Calcium: 9.6 mg/dL (ref 8.9–10.3)
Chloride: 105 mmol/L (ref 98–111)
Creatinine, Ser: 0.87 mg/dL (ref 0.44–1.00)
GFR, Estimated: >60 mL/min (ref >60)
Glucose, Bld: 248 mg/dL — ABNORMAL HIGH (ref 70–99)
Potassium: 3.8 mmol/L (ref 3.5–5.1)
Sodium: 138 mmol/L (ref 135–145)
Total Bilirubin: 0.7 mg/dL (ref 0.3–1.2)
Total Protein: 7.3 g/dL (ref 6.5–8.1)

## 2022-05-25 LAB — CBC WITH DIFFERENTIAL/PLATELET
Abs Immature Granulocytes: 0.03 10*3/uL (ref 0.00–0.07)
Basophils Absolute: 0 10*3/uL (ref 0.0–0.1)
Basophils Relative: 1 %
Eosinophils Absolute: 0.1 10*3/uL (ref 0.0–0.5)
Eosinophils Relative: 2 %
HCT: 43.7 % (ref 36.0–46.0)
Hemoglobin: 13.9 g/dL (ref 12.0–15.0)
Immature Granulocytes: 1 %
Lymphocytes Relative: 21 %
Lymphs Abs: 1.1 10*3/uL (ref 0.7–4.0)
MCH: 26.8 pg (ref 26.0–34.0)
MCHC: 31.8 g/dL (ref 30.0–36.0)
MCV: 84.2 fL (ref 80.0–100.0)
Monocytes Absolute: 0.4 10*3/uL (ref 0.1–1.0)
Monocytes Relative: 8 %
Neutro Abs: 3.7 10*3/uL (ref 1.7–7.7)
Neutrophils Relative %: 67 %
Platelets: 206 10*3/uL (ref 150–400)
RBC: 5.19 MIL/uL — ABNORMAL HIGH (ref 3.87–5.11)
RDW: 17.2 % — ABNORMAL HIGH (ref 11.5–15.5)
WBC: 5.4 10*3/uL (ref 4.0–10.5)
nRBC: 0 % (ref 0.0–0.2)

## 2022-05-25 LAB — HIV ANTIBODY (ROUTINE TESTING W REFLEX): HIV Screen 4th Generation wRfx: NONREACTIVE

## 2022-05-25 LAB — MAGNESIUM: Magnesium: 2 mg/dL (ref 1.7–2.4)

## 2022-05-25 LAB — GLUCOSE, CAPILLARY: Glucose-Capillary: 303 mg/dL — ABNORMAL HIGH (ref 70–99)

## 2022-05-25 LAB — BRAIN NATRIURETIC PEPTIDE: B Natriuretic Peptide: 744.8 pg/mL — ABNORMAL HIGH (ref 0.0–100.0)

## 2022-05-25 LAB — TROPONIN I (HIGH SENSITIVITY)
Troponin I (High Sensitivity): 8 ng/L (ref <18)
Troponin I (High Sensitivity): 9 ng/L (ref <18)

## 2022-05-25 LAB — HEMOGLOBIN A1C
Hgb A1c MFr Bld: 10.5 % — ABNORMAL HIGH (ref 4.8–5.6)
Mean Plasma Glucose: 254.65 mg/dL

## 2022-05-25 LAB — CBG MONITORING, ED: Glucose-Capillary: 221 mg/dL — ABNORMAL HIGH (ref 70–99)

## 2022-05-25 MED ORDER — PNEUMOCOCCAL 20-VAL CONJ VACC 0.5 ML IM SUSY
0.5000 mL | PREFILLED_SYRINGE | INTRAMUSCULAR | Status: AC
  Administered 2022-05-28: 0.5 mL via INTRAMUSCULAR
  Filled 2022-05-25: qty 0.5

## 2022-05-25 MED ORDER — ONDANSETRON HCL 4 MG/2ML IJ SOLN: 4.0000 mg | Freq: Four times a day (QID) | INTRAMUSCULAR | Status: DC | PRN

## 2022-05-25 MED ORDER — AMLODIPINE BESYLATE 5 MG PO TABS
10.0000 mg | ORAL_TABLET | Freq: Every day | ORAL | Status: DC
  Administered 2022-05-25 – 2022-05-28 (×4): 10 mg via ORAL
  Filled 2022-05-25 (×4): qty 2

## 2022-05-25 MED ORDER — GLIPIZIDE ER 10 MG PO TB24
10.0000 mg | ORAL_TABLET | Freq: Every day | ORAL | Status: DC
  Administered 2022-05-26 – 2022-05-28 (×3): 10 mg via ORAL
  Filled 2022-05-25 (×3): qty 1

## 2022-05-25 MED ORDER — POTASSIUM CHLORIDE CRYS ER 20 MEQ PO TBCR
20.0000 meq | EXTENDED_RELEASE_TABLET | Freq: Two times a day (BID) | ORAL | Status: DC
  Administered 2022-05-25 – 2022-05-26 (×2): 20 meq via ORAL
  Filled 2022-05-25 (×2): qty 1

## 2022-05-25 MED ORDER — HYDROXYZINE HCL 25 MG PO TABS
25.0000 mg | ORAL_TABLET | Freq: Three times a day (TID) | ORAL | Status: DC | PRN
Start: 2022-05-25 — End: 2022-05-28
  Administered 2022-05-25 – 2022-05-27 (×2): 25 mg via ORAL
  Filled 2022-05-25 (×2): qty 1

## 2022-05-25 MED ORDER — LOSARTAN POTASSIUM 50 MG PO TABS
25.0000 mg | ORAL_TABLET | Freq: Every day | ORAL | Status: DC
  Administered 2022-05-25 – 2022-05-28 (×4): 25 mg via ORAL
  Filled 2022-05-25 (×4): qty 1

## 2022-05-25 MED ORDER — HYDROCORTISONE 1 % EX CREA: 1.0000 | TOPICAL_CREAM | Freq: Three times a day (TID) | CUTANEOUS | Status: DC | PRN

## 2022-05-25 MED ORDER — SODIUM CHLORIDE 0.9% FLUSH
3.0000 mL | Freq: Two times a day (BID) | INTRAVENOUS | Status: DC
  Administered 2022-05-25 – 2022-05-28 (×6): 3 mL via INTRAVENOUS

## 2022-05-25 MED ORDER — HYDRALAZINE HCL 20 MG/ML IJ SOLN
10.0000 mg | Freq: Four times a day (QID) | INTRAMUSCULAR | Status: DC | PRN
  Administered 2022-05-25: 10 mg via INTRAVENOUS
  Filled 2022-05-25 (×2): qty 1

## 2022-05-25 MED ORDER — SPIRONOLACTONE 25 MG PO TABS
25.0000 mg | ORAL_TABLET | Freq: Every day | ORAL | Status: DC
  Administered 2022-05-26 – 2022-05-28 (×3): 25 mg via ORAL
  Filled 2022-05-25 (×3): qty 1

## 2022-05-25 MED ORDER — HYDRALAZINE HCL 25 MG PO TABS
25.0000 mg | ORAL_TABLET | Freq: Three times a day (TID) | ORAL | Status: DC
  Administered 2022-05-25 – 2022-05-28 (×8): 25 mg via ORAL
  Filled 2022-05-25 (×9): qty 1

## 2022-05-25 MED ORDER — FUROSEMIDE 10 MG/ML IJ SOLN
80.0000 mg | Freq: Once | INTRAMUSCULAR | Status: AC
Start: 2022-05-25 — End: 2022-05-25
  Administered 2022-05-25: 80 mg via INTRAVENOUS
  Filled 2022-05-25: qty 8

## 2022-05-25 MED ORDER — METFORMIN HCL 1000 MG PO TABS
1000.0000 mg | ORAL_TABLET | Freq: Two times a day (BID) | ORAL | 1 refills | Status: DC
  Filled 2022-05-25: qty 180, 90d supply, fill #0

## 2022-05-25 MED ORDER — FUROSEMIDE 10 MG/ML IJ SOLN
80.0000 mg | Freq: Two times a day (BID) | INTRAMUSCULAR | Status: DC
  Administered 2022-05-25 – 2022-05-27 (×4): 80 mg via INTRAVENOUS
  Filled 2022-05-25 (×4): qty 8

## 2022-05-25 MED ORDER — RIVAROXABAN 20 MG PO TABS
20.0000 mg | ORAL_TABLET | Freq: Every day | ORAL | Status: DC
  Administered 2022-05-25 – 2022-05-28 (×4): 20 mg via ORAL
  Filled 2022-05-25 (×4): qty 1

## 2022-05-25 MED ORDER — IPRATROPIUM-ALBUTEROL 0.5-2.5 (3) MG/3ML IN SOLN: 3.0000 mL | RESPIRATORY_TRACT | Status: DC | PRN

## 2022-05-25 MED ORDER — CARVEDILOL 12.5 MG PO TABS
25.0000 mg | ORAL_TABLET | Freq: Two times a day (BID) | ORAL | Status: DC
Start: 2022-05-26 — End: 2022-05-26

## 2022-05-25 MED ORDER — ESCITALOPRAM OXALATE 10 MG PO TABS
20.0000 mg | ORAL_TABLET | Freq: Every morning | ORAL | Status: DC
  Administered 2022-05-26 – 2022-05-28 (×3): 20 mg via ORAL
  Filled 2022-05-25 (×3): qty 2

## 2022-05-25 MED ORDER — RISPERIDONE 2 MG PO TABS
4.0000 mg | ORAL_TABLET | Freq: Every day | ORAL | Status: DC
Start: 2022-05-25 — End: 2022-05-27
  Administered 2022-05-25 – 2022-05-26 (×2): 4 mg via ORAL
  Filled 2022-05-25 (×2): qty 2

## 2022-05-25 MED ORDER — SODIUM CHLORIDE 0.9 % IV SOLN: 250.0000 mL | INTRAVENOUS | Status: DC | PRN

## 2022-05-25 MED ORDER — INSULIN ASPART 100 UNIT/ML IJ SOLN
0.0000 [IU] | Freq: Three times a day (TID) | INTRAMUSCULAR | Status: DC
  Administered 2022-05-25: 5 [IU] via SUBCUTANEOUS
  Administered 2022-05-26: 3 [IU] via SUBCUTANEOUS
  Administered 2022-05-26: 5 [IU] via SUBCUTANEOUS
  Administered 2022-05-26 – 2022-05-27 (×3): 3 [IU] via SUBCUTANEOUS
  Administered 2022-05-27: 8 [IU] via SUBCUTANEOUS
  Administered 2022-05-28 (×2): 3 [IU] via SUBCUTANEOUS
  Administered 2022-05-28: 11 [IU] via SUBCUTANEOUS

## 2022-05-25 MED ORDER — GLIPIZIDE ER 10 MG PO TB24
10.0000 mg | ORAL_TABLET | Freq: Every day | ORAL | 1 refills | Status: DC
  Filled 2022-05-25: qty 30, 30d supply, fill #0

## 2022-05-25 MED ORDER — SODIUM CHLORIDE 0.9% FLUSH: 3.0000 mL | INTRAVENOUS | Status: DC | PRN

## 2022-05-25 NOTE — ED Provider Notes (Signed)
[facility] Provider Note   CSN: [id number] Arrival date & time: 05/25/22  1035     History  Chief Complaint  Patient presents with   Shortness of Breath    [name] is a 56 y.o. female.  56 year old female with past medical history of HFpEF, A-fib, diabetes, hypertension presents today for evaluation of of about 3-day duration.  Had chest pain yesterday but currently without chest pain.  States she chronically sleeps upright.  Denies PND.  Does have conversational dyspnea.  Denies productive cough, fever, chills.  Has been noncompliant with home medications for the past week with the exception of torsemide.  She takes daily torsemide 20 mg.  The history is provided by the patient. No language interpreter was used.       Home Medications Prior to Admission medications   Medication Sig Start Date End Date Taking? Authorizing Provider  amLODipine (NORVASC) 10 MG tablet Take 1 tablet (10 mg total) by mouth daily. Please fill as a 90 day supply 11/15/21   Bhagat, [place], PA  Blood Glucose Monitoring Suppl (ACCU-CHEK GUIDE) w/Device KIT Use to check blood sugar three times daily. 07/30/21   Charlott Rakes, MD  Blood Pressure Monitor DEVI Please provide patient with insurance approved blood pressure monitor I10.0 07/21/20   [name], NP  carvedilol (COREG) 25 MG tablet Take 1 tablet (25 mg total) by mouth 2 (two) times daily with a meal. Please fill as a 90 day supply 11/15/21   Bhagat, Bhavinkumar, PA  escitalopram (LEXAPRO) 20 MG tablet Take 1 tablet (20 mg total) by mouth every morning. Please fill as a 90 day supply 11/18/21 02/18/22  Charlott Rakes, MD  glipiZIDE (GLUCOTROL XL) 10 MG 24 hr tablet Take 1 tablet (10 mg total) by mouth daily with breakfast. Please fill as a 90 day supply 05/25/22   Charlott Rakes, MD  glucose blood test strip USE AS DIRECTED TO TEST BLOOD SUGAR THREE TIMES DAILY 07/30/21 07/30/22  [name], NP   hydrALAZINE (APRESOLINE) 25 MG tablet Take 1 tablet (25 mg total) by mouth every 8 (eight) hours. Please fill as a 90 day supply 11/15/21 02/13/22  Leanor Kail, PA  hydrOXYzine (ATARAX/VISTARIL) 25 MG tablet Take 1 tablet (25 mg total) by mouth 3 (three) times daily as needed for itching. 07/30/21   [name], NP  Incontinence Supply Disposable (INCONTINENCE BRIEF LARGE) MISC Please provide patient with insurance approved incontinence supplies/briefs 07/02/20   [name], NP  losartan (COZAAR) 25 MG tablet Take 1 tablet (25 mg total) by mouth daily. Please fill as a 90 day supply 11/15/21 02/13/22  Leanor Kail, PA  metFORMIN (GLUCOPHAGE) 1000 MG tablet Take 1 tablet (1,000 mg total) by mouth 2 (two) times daily with a meal. Please fill as a 90 day supply 05/25/22   Charlott Rakes, MD  Misc. Devices MISC Please provide BiPAP machine with the following: Settings 22/18 cm H2O. Medium  size [employer] Full Face Mask Simplus mask and heated  humidification. 02/28/20   [name], NP  Misc. Devices MISC Please provide patient a Blood Pressure Monitor w/ insurance approval. 07/18/20   [name], NP  Naphazoline-Pheniramine (EYE ALLERGY RELIEF OP) Place 1 drop into both eyes daily as needed (allergy).    [provider]  risperidone (RISPERDAL) 4 MG tablet Take 1 tablet (4 mg total) by mouth daily. Please fill as a 90 day supply 11/19/21 02/17/22  Charlott Rakes,  MD  rivaroxaban (XARELTO) 20 MG TABS tablet Take 1 tablet (20 mg total) by mouth daily with supper. Please fill as a 90 day supply 11/15/21 02/13/22  Leanor Kail, PA  spironolactone (ALDACTONE) 25 MG tablet Take 1 tablet (25 mg total) by mouth daily. 11/18/21   Lyda Jester M, PA-C  torsemide (DEMADEX) 20 MG tablet Take 4 tablets (80 mg total) by mouth 2 (two) times daily. 11/15/21   Bhagat, Crista Luria, PA  furosemide (LASIX) 80 MG tablet Take 1 tablet (80 mg total) by mouth in the morning,  at noon, and at bedtime. 12/19/19 12/30/19  Allie Bossier, MD  sitaGLIPtin (JANUVIA) 100 MG tablet Take 1 tablet (100 mg total) by mouth daily. 10/02/20 10/24/20  [name], NP      Allergies    Acetaminophen, Caffeine, Farxiga [dapagliflozin], and Lisinopril    Review of Systems   Review of Systems  Constitutional:  Negative for chills and fever.  Respiratory:  Positive for shortness of breath. Negative for cough.   Cardiovascular:  Positive for leg swelling. Negative for chest pain and palpitations.  Gastrointestinal:  Negative for abdominal pain, nausea and vomiting.  Neurological:  Negative for light-headedness.  All other systems reviewed and are negative.   Physical Exam Updated Vital Signs BP (!) 165/108 (BP Location: Right Arm)   Pulse 96   Temp 98.1 F (36.7 C) (Oral)   Resp 17   SpO2 96%  Physical Exam Vitals and nursing note reviewed.  Constitutional:      General: She is not in acute distress.    Appearance: Normal appearance. She is not ill-appearing.  HENT:     Head: Normocephalic and atraumatic.     Nose: Nose normal.  Eyes:     Conjunctiva/sclera: Conjunctivae normal.  Cardiovascular:     Rate and Rhythm: Normal rate and regular rhythm.     Pulses: Normal pulses.     Comments: Unable to appreciate JVD on exam Pulmonary:     Effort: Pulmonary effort is normal. No respiratory distress.     Breath sounds: No wheezing.     Comments: Diminished lung sounds throughout Abdominal:     Palpations: Abdomen is soft.     Tenderness: There is no abdominal tenderness. There is no guarding.  Musculoskeletal:        General: No deformity.  Skin:    Findings: No rash.  Neurological:     Mental Status: She is alert.     ED Results / Procedures / Treatments   Labs (all labs ordered are listed, but only abnormal results are displayed) Labs Reviewed  CBC WITH DIFFERENTIAL/PLATELET - Abnormal; Notable for the following components:      Result Value   RBC 5.19  (*)    RDW 17.2 (*)    All other components within normal limits  BRAIN NATRIURETIC PEPTIDE - Abnormal; Notable for the following components:   B Natriuretic Peptide 744.8 (*)    All other components within normal limits  COMPREHENSIVE METABOLIC PANEL  MAGNESIUM  TROPONIN I (HIGH SENSITIVITY)    EKG None  Radiology DG Chest 2 View  Result Date: 05/25/2022 CLINICAL DATA:  Provided history: Chest pain. Shortness of breath. Patient reports illness for 3 weeks. History of CHF. EXAM: CHEST - 2 VIEW COMPARISON:  Prior chest radiographs 11/10/2021 and earlier. FINDINGS: Cardiomegaly, unchanged. Central pulmonary vascular congestion. Interstitial and ill-defined airspace opacities, predominantly within the right mid and lower lung fields. Trace fissural fluid on the right. No evidence  of pleural effusion or pneumothorax. No acute bony abnormality identified. IMPRESSION: Cardiomegaly with central pulmonary vascular congestion. Interstitial and ill-defined airspace opacities, predominantly within the right mid and lower lung fields, which may reflect asymmetric edema or infection. Trace fissural fluid on the right. Electronically Signed   By: Kellie Simmering D.O.   On: 05/25/2022 11:32    Procedures Procedures    Medications Ordered in ED Medications - No data to display  ED Course/ Medical Decision Making/ A&P                           Medical Decision Making Amount and/or Complexity of Data Reviewed Labs: ordered. Radiology: ordered.  Risk Prescription drug management. Decision regarding hospitalization.   Medical Decision Making / ED Course   This patient presents to the ED for concern of shortness of breath, this involves an extensive number of treatment options, and is a complaint that carries with it a high risk of complications and morbidity.  The differential diagnosis includes ACS, PE, pneumonia, CHF exacerbation  MDM: 56 year old female with past medical history as listed  above presents today for evaluation of worsening shortness of breath over the past 3 days.  Does have history of HFpEF.  Compliant with torsemide.  Had chest pain yesterday but currently without chest pain.  Work-up demonstrates elevated BNP of 744, CBC without leukocytosis or anemia.  CMP with elevated glucose otherwise without acute abnormalities.  Initial troponin of 8.  Magnesium of 2.0.  EKG without acute ischemic changes.  Chest x-ray with signs of volume overload. Preserved renal function.  Will provide 80 mg IV push Lasix.  Will discuss with cardiology. Patient discussed with cards master.  Patient will be evaluated by cardiology and they will decide if patient is appropriate for cardiology or medicine service. Cardiology recommends medicine admission.  Discussed with hospitalist who will evaluate patient for admission.   Lab Tests: -I ordered, reviewed, and interpreted labs.   The pertinent results include:   Labs Reviewed  CBC WITH DIFFERENTIAL/PLATELET - Abnormal; Notable for the following components:      Result Value   RBC 5.19 (*)    RDW 17.2 (*)    All other components within normal limits  COMPREHENSIVE METABOLIC PANEL - Abnormal; Notable for the following components:   Glucose, Bld 248 (*)    Albumin 3.4 (*)    All other components within normal limits  BRAIN NATRIURETIC PEPTIDE - Abnormal; Notable for the following components:   B Natriuretic Peptide 744.8 (*)    All other components within normal limits  MAGNESIUM  TROPONIN I (HIGH SENSITIVITY)  TROPONIN I (HIGH SENSITIVITY)      EKG  EKG Interpretation  Date/Time:    Ventricular Rate:    PR Interval:    QRS Duration:   QT Interval:    QTC Calculation:   R Axis:     Text Interpretation:           Imaging Studies ordered: I ordered imaging studies including chest x-ray I independently visualized and interpreted imaging. I agree with the radiologist interpretation   Medicines ordered and  prescription drug management: Meds ordered this encounter  Medications   furosemide (LASIX) injection 80 mg    -I have reviewed the patients home medicines and have made adjustments as needed  Reevaluation: After the interventions noted above, I reevaluated the patient and found that they have :stayed the same  Co morbidities that complicate the patient  evaluation  Past Medical History:  Diagnosis Date   Acute on chronic diastolic congestive heart failure ([place]) 11/02/2013   10/03/2015, 11/13/2015, 08/03/2017   Benign essential HTN 11/28/2013   Bipolar disease, chronic ([place])    Chest pain    a. 2012 Myoview: EF 63%, no isch/infarct;  b. 04/2016 Lexiscan MV: EF 73%, no ischemia/infarct-->Low risk.   Chronic diastolic CHF (congestive heart failure) ([place]) 07/23/2011   a. 2015 Echo: EF 55-60%, Gr2 DD;  b. 09/2015 Echo: EF 60-65%, no rwma, mod dil LA, PASP 98mHg.   Cor pulmonale (chronic) ([facility])    History of thyrotoxicosis    HTN (hypertension) 11/28/2013   Hypertensive heart disease 10/18/2013   Hypoglycemia    Insulin dependent type 2 diabetes mellitus, uncontrolled    Mediastinal adenopathy    Morbid obesity due to excess calories ([facility] 02/19/2011   Morbid obesity with BMI of 50.0-59.9, adult ([facility])    OSA (obstructive sleep apnea) 03/06/2011   Persistent [place] 12/09/2017   Pulmonary HTN, moderate to severe 11/03/2013   Sinusitis, chronic 01/02/2015   SVT (supraventricular tachycardia) [place] 12/06/2013   Uncontrolled type 2 diabetes mellitus with hyperglycemia ([facility]       Dispostion: Discussed with hospitalist who will evaluate patient for admission.   Final Clinical Impression(s) / ED Diagnoses Final diagnoses:  Acute on chronic diastolic congestive heart failure ([facility]    Rx / DC Orders ED Discharge Orders     None         AEvlyn Courier PA-C 05/25/22 1543    PDavonna Belling MD 05/26/22 [phone]

## 2022-05-25 NOTE — Consult Note (Addendum)
Cardiology Consult   Patient ID: [name] MRN: [id number]; DOB: 1966-01-20   Admission date: 05/25/2022  PCP:  [name], NP   [facility] Cardiologist:  [name], MD  Cardiology APP:  Deberah Pelton, NP    Chief Complaint:  Shortness of breath  Patient Profile:   [name] is a 56 y.o. female with HFpEF, persistent atrial fibrillation on Xarelto, hypertension, obesity, OSA not compliant with CPAP who is being seen 05/25/2022 for the evaluation of CHF.  History of Present Illness:   [name] is a 56 year old female with past medical history noted below.  She has been followed by Dr. [name] as an outpatient.  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  Did have VFib with heart cath and treated with defibrillation.  Echocardiogram 03/2021 with LVEF of 60 to 65%, no regional wall motion abnormality, severe LVH, RV normal with moderately elevated PA systolic pressure, left atrial size severely dilated and right atrium mildly dilated, mild MR with moderate to severe TR.  She was admitted 10/2021 with acute on chronic diastolic CHF with volume overload.  Her atrial fibrillation was noted to be well controlled.  She was diuresed with IV Lasix to a weight of 328 pounds.  Transition back to p.o. torsemide 80 mg twice daily, continued on spironolactone 25 mg daily and Jardiance 10 mg daily.  She was seen in the [facility] heart failure clinic for follow-up.  Her weight was slightly up 4 pounds.  No lower extremity edema was noted.  Her heart rate was well controlled in the 80s.  She was continued on torsemide 80 mg BID, spironolactone 25 mg daily, Jardiance 10 mg daily, Coreg 25 mg twice daily, losartan 25 mg daily.  Referred back to general cardiology for follow-up.  Presented to the ED on  8/28 with complaints of increased cough and shortness of breath for the past several days.  She has been out of the country for the past 3 months in [place] visiting family.  Reports she ran out of all her medication several weeks ago and has been experiencing increasing shortness of breath, nonproductive cough and lower extremity edema.  States she had some chest tightness the day prior to admission.  She presented to the internal medicine clinic with symptoms and was noted to be hypoxic, was referred to the ED for further evaluation.  Labs in the ED showed sodium 138, potassium 3.8, creatinine 0.87, BNP 744, WBC 5.4, hemoglobin 13.9.  Chest x-ray showed cardiomegaly with central pulmonary vascular congestion, dominantly within the right mid and lower lung fields.  EKG showed atrial fibrillation, 99 bpm.  She was given IV Lasix 80 mg x 1 in the ED, with some urinary output, though not noted in I's and O's.  Past Medical History:  Diagnosis Date   Acute on chronic diastolic congestive heart failure ([place]) 11/02/2013   10/03/2015, 11/13/2015, 08/03/2017   Benign essential HTN 11/28/2013   Bipolar disease, chronic ([place])    Chest pain    a. 2012 Myoview: EF 63%, no isch/infarct;  b. 04/2016 Lexiscan MV: EF 73%, no ischemia/infarct-->Low risk.   Chronic diastolic CHF (congestive heart failure) ([place]) 07/23/2011   a. 2015 Echo: EF 55-60%, Gr2 DD;  b. 09/2015 Echo: EF 60-65%, no rwma, mod dil LA, PASP 70mHg.   Cor pulmonale (chronic) (  [place])    History of thyrotoxicosis    HTN (hypertension) 11/28/2013   Hypertensive heart disease 10/18/2013   Hypoglycemia    Insulin dependent type 2 diabetes mellitus, uncontrolled    Mediastinal adenopathy    Morbid obesity due to excess calories ([place]) 02/19/2011   Morbid obesity with BMI of 50.0-59.9, adult (HCC)    OSA (obstructive sleep apnea) 03/06/2011   Persistent atrial fibrillation ([place]) 12/09/2017   Pulmonary HTN, moderate to severe 11/03/2013   Sinusitis, chronic  01/02/2015   SVT (supraventricular tachycardia) ([place]) 12/06/2013   Uncontrolled type 2 diabetes mellitus with hyperglycemia [facility])     Past Surgical History:  Procedure Laterality Date   CARDIOVERSION N/A 04/05/2018   Procedure: CARDIOVERSION;  Surgeon: Lelon Perla, MD;  Location: [facility];  Service: Cardiovascular;  Laterality: N/A;   COLONOSCOPY WITH PROPOFOL Left 07/16/2018   Procedure: COLONOSCOPY WITH PROPOFOL;  Surgeon: Ronnette Juniper, MD;  Location: [facility];  Service: Gastroenterology;  Laterality: Left;   LEFT HEART CATH AND CORONARY ANGIOGRAPHY N/A 08/04/2018   Procedure: LEFT HEART CATH AND CORONARY ANGIOGRAPHY;  Surgeon: Belva Crome, MD;  Location: [facility];  Service: Cardiovascular;  Laterality: N/A;   None     POLYPECTOMY  07/16/2018   Procedure: POLYPECTOMY;  Surgeon: Ronnette Juniper, MD;  Location: [facility];  Service: Gastroenterology;;     Medications Prior to Admission: Prior to Admission medications   Medication Sig Start Date End Date Taking? Authorizing Provider  amLODipine (NORVASC) 10 MG tablet Take 1 tablet (10 mg total) by mouth daily. Please fill as a 90 day supply 11/15/21   Bhagat, [place], PA  Blood Glucose Monitoring Suppl (ACCU-CHEK GUIDE) w/Device KIT Use to check blood sugar three times daily. 07/30/21   Charlott Rakes, MD  Blood Pressure Monitor DEVI Please provide patient with insurance approved blood pressure monitor I10.0 07/21/20   [name], NP  carvedilol (COREG) 25 MG tablet Take 1 tablet (25 mg total) by mouth 2 (two) times daily with a meal. Please fill as a 90 day supply 11/15/21   Bhagat, Bhavinkumar, PA  escitalopram (LEXAPRO) 20 MG tablet Take 1 tablet (20 mg total) by mouth every morning. Please fill as a 90 day supply 11/18/21 02/18/22  Charlott Rakes, MD  glipiZIDE (GLUCOTROL XL) 10 MG 24 hr tablet Take 1 tablet (10 mg total) by mouth daily with breakfast. Please fill as a 90 day supply 05/25/22   Charlott Rakes, MD   hydrALAZINE (APRESOLINE) 25 MG tablet Take 1 tablet (25 mg total) by mouth every 8 (eight) hours. Please fill as a 90 day supply 11/15/21 02/13/22  Leanor Kail, PA  hydrOXYzine (ATARAX/VISTARIL) 25 MG tablet Take 1 tablet (25 mg total) by mouth 3 (three) times daily as needed for itching. 07/30/21   [name], NP  Incontinence Supply Disposable (INCONTINENCE BRIEF LARGE) MISC Please provide patient with insurance approved incontinence supplies/briefs 07/02/20   [name], NP  losartan (COZAAR) 25 MG tablet Take 1 tablet (25 mg total) by mouth daily. Please fill as a 90 day supply 11/15/21 02/13/22  Leanor Kail, PA  metFORMIN (GLUCOPHAGE) 1000 MG tablet Take 1 tablet (1,000 mg total) by mouth 2 (two) times daily with a meal. Please fill as a 90 day supply 05/25/22   Charlott Rakes, MD  Naphazoline-Pheniramine (EYE ALLERGY RELIEF OP) Place 1 drop into both eyes daily as needed (allergy).    [provider]  risperidone (RISPERDAL) 4 MG tablet Take 1 tablet (  4 mg total) by mouth daily. Please fill as a 90 day supply 11/19/21 02/17/22  Charlott Rakes, MD  rivaroxaban (XARELTO) 20 MG TABS tablet Take 1 tablet (20 mg total) by mouth daily with supper. Please fill as a 90 day supply 11/15/21 02/13/22  Leanor Kail, PA  spironolactone (ALDACTONE) 25 MG tablet Take 1 tablet (25 mg total) by mouth daily. 11/18/21   Lyda Jester M, PA-C  torsemide (DEMADEX) 20 MG tablet Take 4 tablets (80 mg total) by mouth 2 (two) times daily. 11/15/21   Bhagat, Crista Luria, PA  furosemide (LASIX) 80 MG tablet Take 1 tablet (80 mg total) by mouth in the morning, at noon, and at bedtime. 12/19/19 12/30/19  Allie Bossier, MD  sitaGLIPtin (JANUVIA) 100 MG tablet Take 1 tablet (100 mg total) by mouth daily. 10/02/20 10/24/20  [name], NP     Allergies:    Allergies  Allergen Reactions   Acetaminophen Other (See Comments)    Seizure-like "fits" as a child   Caffeine     Tense,  anxiety, increased urination   [place] [Dapagliflozin] Other (See Comments)    Hallucinations, drop in blood sugar   Lisinopril Rash    Rash with lisinopril; but fosinopril is ok per patient    Social History:   Social History   Socioeconomic History   Marital status: Single    Spouse name: Not on file   Number of children: 0   Years of education: 18   Highest education level: Not on file  Occupational History   Occupation: unemployed  Tobacco Use   Smoking status: Never   Smokeless tobacco: Never  Vaping Use   Vaping Use: Never used  Substance and Sexual Activity   Alcohol use: No   Drug use: No   Sexual activity: Not Currently    Birth control/protection: None  Other Topics Concern   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 But ran out of money and is no longer attending school. (Note patient has bipolar disorder).      Born in [place] but lived in [place] before coming back to [place].       Primary language is Arabic. Lives with mother and brother.   Social Determinants of Health   Financial Resource Strain: Low Risk  (07/15/2018)   Overall Financial Resource Strain (CARDIA)    Difficulty of Paying Living Expenses: Not hard at all  Food Insecurity: No Food Insecurity (07/15/2018)   Hunger Vital Sign    Worried About Running Out of Food in the Last Year: Never true    Ran Out of Food in the Last Year: Never true  Transportation Needs: No Transportation Needs (07/15/2018)   PRAPARE - Hydrologist (Medical): No    Lack of Transportation (Non-Medical): No  Physical Activity: Not on file  Stress: No Stress Concern Present (07/15/2018)   [place]    Feeling of Stress : Only a little  Social Connections: Not on file  Intimate Partner Violence: Not on file    Family  History:   The patient's family history includes Heart disease in her maternal grandfather; Heart failure in her father; Hypertension in her mother; Stroke in her father.    ROS:  Please see the history of present illness.  All other  ROS reviewed and negative.     Physical Exam/Data:   Vitals:   05/25/22 1246 05/25/22 1315 05/25/22 1339 05/25/22 1423  BP: (!) 160/130 (!) 181/127 (!) 162/128 (!) 156/142  Pulse: 100 98 100 91  Resp: (!) 23 (!) '23 17 19  ' Temp:      TempSrc:      SpO2: 98% 93% 94% 98%   No intake or output data in the 24 hours ending 05/25/22 1437    05/25/2022    8:58 AM 11/18/2021   10:45 AM 11/14/2021    3:47 AM  Last 3 Weights  Weight (lbs) 310 lb 12.8 oz 332 lb 3.2 oz 335 lb 11.2 oz  Weight (kg) 140.978 kg 150.685 kg 152.273 kg     There is no height or weight on file to calculate BMI.  General: Obese female, sitting up on the side of the bed comfortably.  Does appear dyspneic with conversation HEENT: normal Neck: + JVD Vascular: No carotid bruits; Distal pulses 2+ bilaterally   Cardiac:  normal S1, S2; RRR; + systolic murmur  Lungs:  clear to auscultation bilaterally, no wheezing, rhonchi or rales  Abd: soft, nontender, no hepatomegaly  Ext: 2+ LE edema Musculoskeletal:  No deformities, BUE and BLE strength normal and equal Skin: warm and dry  Neuro:  CNs 2-12 intact, no focal abnormalities noted Psych:  Normal affect   EKG:  The ECG that was done 05/25/22 was personally reviewed and demonstrates atrial fibrillation, 99 bpm  Relevant CV Studies:  Cath: 07/2018  Widely patent coronary arteries. 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  Courses between PA and AO.  Mild dynamic compression is noted. Hypercontractile left ventricle.  Marked elevation in LVEDP, 26 mmHg.  EF greater than 65%. Ventricular fibrillation treated with defibrillation x1. Conversion to normal sinus rhythm after defibrillation.   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 Resume anticoagulation, likely Xarelto since coronaries are clean. Further management per treating team. Discontinue nitroglycerin IV.   Recommend to resume Rivaroxaban, at currently prescribed dose and frequency   Echo: 03/2021  IMPRESSIONS     1. Left ventricular ejection fraction, by estimation, is 60 to 65%. The  left ventricle has normal function. The left ventricle has no regional  wall motion abnormalities. There is severe left ventricular hypertrophy.  Left ventricular diastolic function  could not be evaluated.   2. Right ventricular systolic function is normal. The right ventricular  size is normal. There is moderately elevated pulmonary artery systolic  pressure.   3. Left atrial size was severely dilated.   4. Right atrial size was mildly dilated.   5. The mitral valve is normal in structure. Mild mitral valve  regurgitation. No evidence of mitral stenosis.   6. Tricuspid valve regurgitation is moderate to severe.   7. The aortic valve is tricuspid. Aortic valve regurgitation is trivial.  No aortic stenosis is present.   8. The inferior vena cava is dilated in size with >50% respiratory  variability, suggesting right atrial pressure of 8 mmHg.   FINDINGS   Left Ventricle: Left ventricular ejection fraction, by estimation, is 60  to 65%. The left ventricle has normal function. The left ventricle has no  regional wall motion abnormalities. The left ventricular internal cavity  size was normal in size. There is   severe left ventricular hypertrophy. Left ventricular diastolic function  could not be evaluated due to atrial fibrillation. Left ventricular  diastolic function  could not be evaluated.   Right Ventricle: The right ventricular size is normal. Right ventricular  systolic function is normal. There is moderately elevated pulmonary artery  systolic pressure. The tricuspid regurgitant  velocity is 2.86 m/s, and  with an assumed right atrial  pressure of 15 mmHg, the estimated right ventricular systolic pressure is  96.2 mmHg.   Left Atrium: Left atrial size was severely dilated.   Right Atrium: Right atrial size was mildly dilated.   Pericardium: Trivial pericardial effusion is present.   Mitral Valve: The mitral valve is normal in structure. Mild mitral annular  calcification. Mild mitral valve regurgitation. No evidence of mitral  valve stenosis.   Tricuspid Valve: The tricuspid valve is normal in structure. Tricuspid  valve regurgitation is moderate to severe. No evidence of tricuspid  stenosis.   Aortic Valve: The aortic valve is tricuspid. Aortic valve regurgitation is  trivial. No aortic stenosis is present.   Pulmonic Valve: The pulmonic valve was normal in structure. Pulmonic valve  regurgitation is not visualized. No evidence of pulmonic stenosis.   Aorta: The aortic root is normal in size and structure.   Venous: The inferior vena cava is dilated in size with greater than 50%  respiratory variability, suggesting right atrial pressure of 8 mmHg.   IAS/Shunts: No atrial level shunt detected by color flow Doppler.   Laboratory Data:  High Sensitivity Troponin:   Recent Labs  Lab 05/25/22 1101 05/25/22 1300  TROPONINIHS 8 9      Chemistry Recent Labs  Lab 05/25/22 1101  NA 138  K 3.8  CL 105  CO2 23  GLUCOSE 248*  BUN 12  CREATININE 0.87  CALCIUM 9.6  MG 2.0  GFRNONAA >60  ANIONGAP 10    Recent Labs  Lab 05/25/22 1101  PROT 7.3  ALBUMIN 3.4*  AST 21  ALT 10  ALKPHOS 111  BILITOT 0.7   Lipids No results for input(s): "CHOL", "TRIG", "HDL", "LABVLDL", "LDLCALC", "CHOLHDL" in the last 168 hours. Hematology Recent Labs  Lab 05/25/22 1101  WBC 5.4  RBC 5.19*  HGB 13.9  HCT 43.7  MCV 84.2  MCH 26.8  MCHC 31.8  RDW 17.2*  PLT 206   Thyroid No results for input(s): "TSH", "FREET4" in the last 168 hours. BNP Recent  Labs  Lab 05/25/22 1101  BNP 744.8*    DDimer No results for input(s): "DDIMER" in the last 168 hours.   Radiology/Studies:  DG Chest 2 View  Result Date: 05/25/2022 CLINICAL DATA:  Provided history: Chest pain. Shortness of breath. Patient reports illness for 3 weeks. History of CHF. EXAM: CHEST - 2 VIEW COMPARISON:  Prior chest radiographs 11/10/2021 and earlier. FINDINGS: Cardiomegaly, unchanged. Central pulmonary vascular congestion. Interstitial and ill-defined airspace opacities, predominantly within the right mid and lower lung fields. Trace fissural fluid on the right. No evidence of pleural effusion or pneumothorax. No acute bony abnormality identified. IMPRESSION: Cardiomegaly with central pulmonary vascular congestion. Interstitial and ill-defined airspace opacities, predominantly within the right mid and lower lung fields, which may reflect asymmetric edema or infection. Trace fissural fluid on the right. Electronically Signed   By: Kellie Simmering D.O.   On: 05/25/2022 11:32     Assessment and Plan:   [name] is a 56 y.o. female with HFpEF, persistent atrial fibrillation on Xarelto, hypertension, obesity, OSA not compliant with CPAP who is being seen 05/25/2022 for the evaluation of CHF.  HFpEF -- Echocardiogram 03/2021 showed LVEF of 60 to 65%, she  has been out of her home medications including her torsemide 80 mg twice daily for several weeks.  BNP 744, chest x-ray with edema.  She has been given IV Lasix 80 mg x 1 in the ED with some urine output noted -- Start IV Lasix 80 mg twice daily -- Daily weights, I&O's -- Resume Jardiance 10 mg daily  Persistent atrial fibrillation -- Rate controlled in the 80-90 range on telemetry -- Resume home Xarelto, Coreg 25 mg twice daily  Hypertension -- Blood pressure significantly elevated in the ED -- Resume carvedilol 25 mg twice daily, losartan 25 mg daily, spironolactone 25 mg daily  Diabetes -- Last hemoglobin A1c 6.2 --  Continue Jardiance -- SSI while admitted  OSA -- Not compliant with CPAP  Obesity -- Diet and lifestyle modification  Risk Assessment/Risk Scores:   New York Heart Association (NYHA) Functional Class NYHA Class III  CHA2DS2-VASc Score = 3  This indicates a 3.2% annual risk of stroke. The patient's score is based upon: CHF History: 0 HTN History: 1 Diabetes History: 1 Stroke History: 0 Vascular Disease History: 0 Age Score: 0 Gender Score: 1    For questions or updates, please contact [place] Please consult [email or web address] for contact info under     Signed, Reino Bellis, NP  05/25/2022 2:37 PM     Attending Note:   The patient was seen and examined.  Agree with assessment and plan as noted above.  Changes made to the above note as needed.  Patient seen and independently examined with Reino Bellis, NP.   We discussed all aspects of the encounter. I agree with the assessment and plan as stated above.   Hx of chronic diastolic CHF, obesity, OSA ( non compliant with CPAP)   Acute on chronic diastolic congestive heart failure: [name] is seen for worsening CHF.  She has been in [place] for 3 months, stayed longer than she anticipated, and ran out of meds.   She cannot lie flat,  sleeps sitting up No angina pain   Agree with diuresis  Cont IV lasix for now. ( She needs a pure wick or foley catheter)  Will need additional potassium  Start spironolactone and coreg tomorrow  Restart other meds as tolerated.    2.  Obstructive sleep apnea:   has CPAP  3.  Morbid obesity :  needs to work on calorie reduction   4.  Atrial fib:   restart Xarelto.  Rate control with coreg ( to be started after she has diuresed)   5.  HTN:   restart med as tolerated.    I have spent a total of 40 minutes with patient reviewing hospital  notes , telemetry, EKGs, labs and examining patient as well as establishing an assessment and plan that was discussed with the patient.  >  50% of time was spent in direct patient care.    Thayer Headings, Brooke Bonito., MD, [facility] 05/25/2022, 2:48 PM [address]  [place] Pager [phone]

## 2022-05-25 NOTE — ED Provider Triage Note (Signed)
Emergency Medicine Provider Triage Evaluation Note  [name] , a 56 y.o. female  was evaluated in triage.  Pt complains of shortness of breath associated with chest pain yesterday.  Currently without chest pain.  Has history of CHF.  Reports compliance with all medications.  Endorses recent worsening peripheral edema.  Chronic orthopnea.  Review of Systems  Positive: As above Negative: As above  Physical Exam  BP (!) 165/108 (BP Location: Right Arm)   Pulse 96   Temp 98.1 F (36.7 C) (Oral)   Resp 17   SpO2 96%  Gen:   Awake, no distress   Resp:  Normal effort  MSK:   Moves extremities without difficulty  Other:    Medical Decision Making  Medically screening exam initiated at 10:48 AM.  Appropriate orders placed.  ZHYQM Hulce was informed that the remainder of the evaluation will be completed by another provider, this initial triage assessment does not replace that evaluation, and the importance of remaining in the ED until their evaluation is complete.     Evlyn Courier, PA-C 05/25/22 1049

## 2022-05-25 NOTE — Progress Notes (Signed)
Cough and fever for 3 weeks Chest pains yesterday. Has SOB

## 2022-05-25 NOTE — H&P (Signed)
History and Physical    DOA: 05/25/2022  PCP: [name], NP  Patient coming from: Home  Chief Complaint: Shortness of breath  HPI: [name] is a 56 y.o. female with history h/o hypertension, DM type II, morbid obesity, OSA, chronic diastolic CHF, chronic right heart failure, pulmonary hypertension, SVT/atrial fibrillation on Xarelto, bipolar disease presents with complaints of worsening dyspnea over the last 1 week associated with orthopnea and leg swellings.  Patient denies any complaints of chest pain.  She states she was sick with fever 102F and productive cough with green phlegm last week.  She ran out of her medications but was feeling weak and did not renew them.  She was saturating well on room air on arrival here but afebrile.  She is not reporting cough currently..  She states she usually prefers to sleep in the chair at about 45 degrees inclination or uses 3 pillows in the bed.  She states her condition/orthopnea had in fact worsened over the last few days. ED course: Patient noted to have uncontrolled BP with systolic up to 846K, respiratory rate 16-23, pulse 91-105, afebrile.  CBC and BMP unremarkable, HS troponin 8, BG 248, magnesium 2.0, creatinine 0.8.  Chest x-ray with pulmonary edema, BNP 744, albumin 3.4.  Patient received 80 mg IV Lasix in the ED and feels improved.  Blood pressure still elevated.  Seen by heart failure team and recommended to be admitted for with IV diuretics.   Review of Systems: As per HPI, otherwise review of systems negative.    Past Medical History:  Diagnosis Date   Acute on chronic diastolic congestive heart failure ([place]) 11/02/2013   10/03/2015, 11/13/2015, 08/03/2017   Benign essential HTN 11/28/2013   Bipolar disease, chronic ([place])    Chest pain    a. 2012 Myoview: EF 63%, no isch/infarct;  b. 04/2016 Lexiscan MV: EF 73%, no ischemia/infarct-->Low risk.   Chronic diastolic CHF (congestive heart failure) ([place]) 07/23/2011   a. 2015  Echo: EF 55-60%, Gr2 DD;  b. 09/2015 Echo: EF 60-65%, no rwma, mod dil LA, PASP 73mHg.   Cor pulmonale (chronic) (HCC)    History of thyrotoxicosis    HTN (hypertension) 11/28/2013   Hypertensive heart disease 10/18/2013   Hypoglycemia    Insulin dependent type 2 diabetes mellitus, uncontrolled    Mediastinal adenopathy    Morbid [place] due to excess calories ([facility] 02/19/2011   Morbid obesity with BMI of 50.0-59.9, adult (HCC)    OSA (obstructive sleep apnea) 03/06/2011   Persistent atrial fibrillation ([facility] 12/09/2017   Pulmonary HTN, moderate to severe 11/03/2013   Sinusitis, chronic 01/02/2015   SVT (supraventricular tachycardia) ([facility] 12/06/2013   Uncontrolled type 2 diabetes mellitus with hyperglycemia ([facility]     Past Surgical History:  Procedure Laterality Date   CARDIOVERSION N/A 04/05/2018   Procedure: CARDIOVERSION;  Surgeon: CLelon Perla MD;  [facility]  Service: Cardiovascular;  Laterality: N/A;   COLONOSCOPY WITH PROPOFOL Left 07/16/2018   Procedure: COLONOSCOPY WITH PROPOFOL;  Surgeon: KRonnette Juniper MD;  Location: [facility];  Service: Gastroenterology;  Laterality: Left;   LEFT HEART CATH AND CORONARY ANGIOGRAPHY N/A 08/04/2018   Procedure: LEFT HEART CATH AND CORONARY ANGIOGRAPHY;  Surgeon: SBelva Crome MD;  [facility];  Service: Cardiovascular;  Laterality: N/A;   None     POLYPECTOMY  07/16/2018   Procedure: POLYPECTOMY;  Surgeon: KRonnette Juniper MD;  [facility];  Service: Gastroenterology;;    Social history:  reports  that she has never smoked. She has never used smokeless tobacco. She reports that she does not drink alcohol and does not use drugs.   Allergies  Allergen Reactions   Acetaminophen Other (See Comments)    Seizure-like "fits" as a child   Caffeine     Tense, anxiety, increased urination   [place] [Dapagliflozin] Other (See Comments)    Hallucinations, drop in blood sugar   Lisinopril Rash    Rash with  lisinopril; but fosinopril is ok per patient    Family History  Problem Relation Age of Onset   Heart failure Father    Stroke Father    Hypertension Mother    Heart disease Maternal Grandfather       Prior to Admission medications   Medication Sig Start Date End Date Taking? Authorizing Provider  amLODipine (NORVASC) 10 MG tablet Take 1 tablet (10 mg total) by mouth daily. Please fill as a 90 day supply 11/15/21   Bhagat, [place], PA  Blood Glucose Monitoring Suppl (ACCU-CHEK GUIDE) w/Device KIT Use to check blood sugar three times daily. 07/30/21   Charlott Rakes, MD  Blood Pressure Monitor DEVI Please provide patient with insurance approved blood pressure monitor I10.0 07/21/20   [name], NP  carvedilol (COREG) 25 MG tablet Take 1 tablet (25 mg total) by mouth 2 (two) times daily with a meal. Please fill as a 90 day supply 11/15/21   Bhagat, Bhavinkumar, PA  escitalopram (LEXAPRO) 20 MG tablet Take 1 tablet (20 mg total) by mouth every morning. Please fill as a 90 day supply 11/18/21 02/18/22  Charlott Rakes, MD  glipiZIDE (GLUCOTROL XL) 10 MG 24 hr tablet Take 1 tablet (10 mg total) by mouth daily with breakfast. Please fill as a 90 day supply 05/25/22   Charlott Rakes, MD  hydrALAZINE (APRESOLINE) 25 MG tablet Take 1 tablet (25 mg total) by mouth every 8 (eight) hours. Please fill as a 90 day supply 11/15/21 02/13/22  Leanor Kail, PA  hydrOXYzine (ATARAX/VISTARIL) 25 MG tablet Take 1 tablet (25 mg total) by mouth 3 (three) times daily as needed for itching. 07/30/21   [name], NP  Incontinence Supply Disposable (INCONTINENCE BRIEF LARGE) MISC Please provide patient with insurance approved incontinence supplies/briefs 07/02/20   [name], NP  losartan (COZAAR) 25 MG tablet Take 1 tablet (25 mg total) by mouth daily. Please fill as a 90 day supply 11/15/21 02/13/22  Leanor Kail, PA  metFORMIN (GLUCOPHAGE) 1000 MG tablet Take 1 tablet (1,000 mg total)  by mouth 2 (two) times daily with a meal. Please fill as a 90 day supply 05/25/22   Charlott Rakes, MD  Naphazoline-Pheniramine (EYE ALLERGY RELIEF OP) Place 1 drop into both eyes daily as needed (allergy).    [provider]  risperidone (RISPERDAL) 4 MG tablet Take 1 tablet (4 mg total) by mouth daily. Please fill as a 90 day supply 11/19/21 02/17/22  Charlott Rakes, MD  rivaroxaban (XARELTO) 20 MG TABS tablet Take 1 tablet (20 mg total) by mouth daily with supper. Please fill as a 90 day supply 11/15/21 02/13/22  Leanor Kail, PA  spironolactone (ALDACTONE) 25 MG tablet Take 1 tablet (25 mg total) by mouth daily. 11/18/21   Lyda Jester M, PA-C  torsemide (DEMADEX) 20 MG tablet Take 4 tablets (80 mg total) by mouth 2 (two) times daily. 11/15/21   Bhagat, Crista Luria, PA  furosemide (LASIX) 80 MG tablet Take 1 tablet (80 mg total) by mouth in the morning,  at noon, and at bedtime. 12/19/19 12/30/19  Allie Bossier, MD  sitaGLIPtin (JANUVIA) 100 MG tablet Take 1 tablet (100 mg total) by mouth daily. 10/02/20 10/24/20  [name], NP    Physical Exam: Vitals:   05/25/22 1423 05/25/22 1445 05/25/22 1524 05/25/22 1527  BP: (!) 156/142 (!) 120/103  (!) 193/137  Pulse: 91 94  (!) 105  Resp: '19 16  17  ' Temp:   98.4 F (36.9 C)   TempSrc:   Oral   SpO2: 98% 97%  94%    Constitutional: NAD, calm, comfortable Eyes: PERRL, lids and conjunctivae normal ENMT: Mucous membranes are moist. Posterior pharynx clear of any exudate or lesions.Normal dentition.  Neck: normal, supple, no masses, no thyromegaly Respiratory: clear to auscultation bilaterally, no wheezing, no crackles. Normal respiratory effort. No accessory muscle use.  Cardiovascular: Regular rate and rhythm, no murmurs / rubs / gallops.  1+ pitting bilateral lower extremity edema. 2+ pedal pulses. No carotid bruits.  Abdomen: no tenderness, no masses palpated. No hepatosplenomegaly. Bowel sounds positive.  Musculoskeletal:  no clubbing / cyanosis. No joint deformity upper and lower extremities. Good ROM, no contractures. Normal muscle tone.  Neurologic: CN 2-12 grossly intact. Sensation intact, DTR normal. Strength 5/5 in all 4.  Psychiatric: Normal judgment and insight. Alert and oriented x 3. Normal mood.  SKIN/catheters: no rashes, lesions, ulcers. No induration  Labs on Admission: I have personally reviewed following labs and imaging studies  CBC: Recent Labs  Lab 05/25/22 1101  WBC 5.4  NEUTROABS 3.7  HGB 13.9  HCT 43.7  MCV 84.2  PLT 680   Basic Metabolic Panel: Recent Labs  Lab 05/25/22 1101  NA 138  K 3.8  CL 105  CO2 23  GLUCOSE 248*  BUN 12  CREATININE 0.87  CALCIUM 9.6  MG 2.0   GFR: CrCl cannot be calculated (Unknown ideal weight.). Recent Labs  Lab 05/25/22 1101  WBC 5.4   Liver Function Tests: Recent Labs  Lab 05/25/22 1101  AST 21  ALT 10  ALKPHOS 111  BILITOT 0.7  PROT 7.3  ALBUMIN 3.4*   No results for input(s): "LIPASE", "AMYLASE" in the last 168 hours. No results for input(s): "AMMONIA" in the last 168 hours. Coagulation Profile: No results for input(s): "INR", "PROTIME" in the last 168 hours. Cardiac Enzymes: No results for input(s): "CKTOTAL", "CKMB", "CKMBINDEX", "TROPONINI" in the last 168 hours. BNP (last 3 results) No results for input(s): "PROBNP" in the last 8760 hours. HbA1C: No results for input(s): "HGBA1C" in the last 72 hours. CBG: No results for input(s): "GLUCAP" in the last 168 hours. Lipid Profile: No results for input(s): "CHOL", "HDL", "LDLCALC", "TRIG", "CHOLHDL", "LDLDIRECT" in the last 72 hours. Thyroid Function Tests: No results for input(s): "TSH", "T4TOTAL", "FREET4", "T3FREE", "THYROIDAB" in the last 72 hours. Anemia Panel: No results for input(s): "VITAMINB12", "FOLATE", "FERRITIN", "TIBC", "IRON", "RETICCTPCT" in the last 72 hours. Urine analysis:    Component Value Date/Time   COLORURINE YELLOW 08/06/2018 [place] 08/06/2018 0753   LABSPEC 1.018 08/06/2018 0753   PHURINE 5.0 08/06/2018 0753   GLUCOSEU 50 (A) 08/06/2018 0753   HGBUR MODERATE (A) 08/06/2018 0753   BILIRUBINUR small (A) 07/02/2020 1116   BILIRUBINUR neg 09/10/2014 1219   KETONESUR trace (5) (A) 07/02/2020 1116   KETONESUR NEGATIVE 08/06/2018 0753   PROTEINUR 100 (A) 08/06/2018 0753   UROBILINOGEN 1.0 07/02/2020 1116   UROBILINOGEN 0.2 12/07/2013 0041   NITRITE Negative 07/02/2020 1116  NITRITE NEGATIVE 08/06/2018 0753   LEUKOCYTESUR Small (1+) (A) 07/02/2020 1116    Radiological Exams on Admission: Personally reviewed  DG Chest 2 View  Result Date: 05/25/2022 CLINICAL DATA:  Provided history: Chest pain. Shortness of breath. Patient reports illness for 3 weeks. History of CHF. EXAM: CHEST - 2 VIEW COMPARISON:  Prior chest radiographs 11/10/2021 and earlier. FINDINGS: Cardiomegaly, unchanged. Central pulmonary vascular congestion. Interstitial and ill-defined airspace opacities, predominantly within the right mid and lower lung fields. Trace fissural fluid on the right. No evidence of pleural effusion or pneumothorax. No acute bony abnormality identified. IMPRESSION: Cardiomegaly with central pulmonary vascular congestion. Interstitial and ill-defined airspace opacities, predominantly within the right mid and lower lung fields, which may reflect asymmetric edema or infection. Trace fissural fluid on the right. Electronically Signed   By: Kellie Simmering D.O.   On: 05/25/2022 11:32    EKG: Independently reviewed.  Atrial fibrillation at 99 bpm     Assessment and Plan:   Principal Problem:   Acute diastolic CHF (congestive heart failure) (HCC) Active Problems:   Cor pulmonale (chronic) (HCC)   OSA (obstructive sleep apnea)- non compliant with C-pap   Benign essential HTN   Bipolar 1 disorder, mixed, moderate (HCC)   Class 3 severe obesity due to excess calories with serious comorbidity and body mass index (BMI)  of 60.0 to 69.9 in adult [facility])   Non-insulin dependent type 2 diabetes mellitus (HCC)   Persistent atrial fibrillation (HCC)   Chronic anticoagulation   Vitamin B12 deficiency    1.  Acute on chronic diastolic CHF exacerbation: In the setting of medication noncompliance.  Admit with IV Lasix 80 mg twice daily as advised by cardiology.  Patient was on torsemide at home.  Resume other home antihypertensives as blood pressure appears to be uncontrolled.  I's and O's.  Heart failure team to follow along.  2.  Obstructive sleep apnea/cor pulmonale: Patient noncompliant with CPAP.  Neb treatments as needed.  IV diuretics as discussed above.  3.  Uncontrolled hypertension: In the setting of noncompliance with medications.  Resume home medications.  IV hydralazine as needed ordered as well.  4.  Diabetes mellitus type 2: Hold metformin, resume other oral hypoglycemics.  SSI for additional coverage.  5.  Chronic atrial fibrillation: Resume home medications including anticoagulation.  6.  Bipolar disorder: Resume home medications  7.  Class III severe obesity: Patient counseled regarding diet compliance.  To follow-up PCP for long-term goals.  DVT prophylaxis: On oral anticoagulation   Code Status: Full code as confirmed with patient.Health care proxy would be her mother  Patient/Family Communication: Discussed with patient and all questions answered to satisfaction.  Consults called: Cardiology Admission status :Patient will be admitted under OBSERVATION status.The patient's presenting symptoms, physical exam findings, and initial radiographic and laboratory data in the context of their medical condition is felt to place them at low risk for further clinical deterioration. Furthermore, it is anticipated that the patient will be medically stable for discharge from the hospital within 2 midnights of hospital stay.       Guilford Shi MD [facility] Pager in [place]  If 7PM-7AM,  please contact night-coverage [email or web address]   05/25/2022, 4:57 PM

## 2022-05-25 NOTE — ED Triage Notes (Signed)
Pt arrived by [facility] with cough for 3 weeks and sob for 3 days.  Pt has history of CHF.  Pt RA 97% for EMS and placed on 02/2L for comfort. BP 180 palp for EMS, afib with history, no fever, no chest pain. Pt has itching all over for a while.

## 2022-05-25 NOTE — ED Notes (Addendum)
ED TO INPATIENT HANDOFF REPORT  ED Nurse Name and Phone #: Andee Poles [phone]   S Name/Age/Gender [name] 56 y.o. female Room/Bed: 001C/001C  Code Status   Code Status: Full Code  Home/SNF/Other Home Patient oriented to: self, place, time, and situation Is this baseline? Yes   Triage Complete: Triage complete  Chief Complaint CHF exacerbation ([place]) [I50.9]  Triage Note Pt arrived by [facility] with cough for 3 weeks and sob for 3 days.  Pt has history of CHF.  Pt RA 97% for EMS and placed on 02/2L for comfort. BP 180 palp for EMS, afib with history, no fever, no chest pain. Pt has itching all over for a while.    Allergies Allergies  Allergen Reactions   Acetaminophen Other (See Comments)    Seizure-like "fits" as a child   Caffeine     Tense, anxiety, increased urination   Farxiga [Dapagliflozin] Other (See Comments)    Hallucinations, drop in blood sugar   Lisinopril Rash    Rash with lisinopril; but fosinopril is ok per patient    Level of Care/Admitting Diagnosis ED Disposition     ED Disposition  Admit   Condition  --   [place]: [place] [[id number]]  Level of Care: Telemetry Cardiac [103]  May place patient in observation at [facility] or [facility] [place] if equivalent level of care is available:: No  Covid Evaluation: Asymptomatic - no recent exposure (last 10 days) testing not required  Diagnosis: CHF exacerbation [facility]) [[id number]]  Admitting Physician: Guilford Shi [[id number]]  Attending Physician: Guilford Shi [[id number]]          B Medical/Surgery History Past Medical History:  Diagnosis Date   Acute on chronic diastolic congestive heart failure ([place]) 11/02/2013   10/03/2015, 11/13/2015, 08/03/2017   Benign essential HTN 11/28/2013   Bipolar disease, chronic ([place])    Chest pain    a. 2012 Myoview: EF 63%, no isch/infarct;  b. 04/2016 Lexiscan MV: EF 73%, no ischemia/infarct-->Low risk.   Chronic diastolic CHF  (congestive heart failure) ([place]) 07/23/2011   a. 2015 Echo: EF 55-60%, Gr2 DD;  b. 09/2015 Echo: EF 60-65%, no rwma, mod dil LA, PASP 66mHg.   Cor pulmonale (chronic) (HCC)    History of thyrotoxicosis    HTN (hypertension) 11/28/2013   Hypertensive heart disease 10/18/2013   Hypoglycemia    Insulin dependent type 2 diabetes mellitus, uncontrolled    Mediastinal adenopathy    Morbid obesity due to excess calories ([place] 02/19/2011   Morbid obesity with BMI of 50.0-59.9, adult (HCC)    OSA (obstructive sleep apnea) 03/06/2011   Persistent atrial fibrillation ([place] 12/09/2017   Pulmonary HTN, moderate to severe 11/03/2013   Sinusitis, chronic 01/02/2015   SVT (supraventricular tachycardia) ([place] 12/06/2013   Uncontrolled type 2 diabetes mellitus with hyperglycemia ([facility]    Past Surgical History:  Procedure Laterality Date   CARDIOVERSION N/A 04/05/2018   Procedure: CARDIOVERSION;  Surgeon: CLelon Perla MD;  [place]  Service: Cardiovascular;  Laterality: N/A;   COLONOSCOPY WITH PROPOFOL Left 07/16/2018   Procedure: COLONOSCOPY WITH PROPOFOL;  Surgeon: KRonnette Juniper MD;  [facility];  Service: Gastroenterology;  Laterality: Left;   LEFT HEART CATH AND CORONARY ANGIOGRAPHY N/A 08/04/2018   Procedure: LEFT HEART CATH AND CORONARY ANGIOGRAPHY;  Surgeon: SBelva Crome MD;  [facility];  Service: Cardiovascular;  Laterality: N/A;   None     POLYPECTOMY  07/16/2018   Procedure: POLYPECTOMY;  Surgeon: Ronnette Juniper, MD;  [facility];  Service: Gastroenterology;;     A IV Location/Drains/Wounds Patient Lines/Drains/Airways Status     Active Line/Drains/Airways     Name Placement date Placement time Site Days   Peripheral IV 05/25/22 20 G Right Antecubital 05/25/22  1300  Antecubital  less than 1            Intake/Output Last 24 hours No intake or output data in the 24 hours ending 05/25/22 1715  Labs/Imaging Results for orders placed  or performed during the hospital encounter of 05/25/22 (from the past 48 hour(s))  CBC with Differential     Status: Abnormal   Collection Time: 05/25/22 11:01 AM  Result Value Ref Range   WBC 5.4 4.0 - 10.5 K/uL   RBC 5.19 (H) 3.87 - 5.11 MIL/uL   Hemoglobin 13.9 12.0 - 15.0 g/dL   HCT 43.7 36.0 - 46.0 %   MCV 84.2 80.0 - 100.0 fL   MCH 26.8 26.0 - 34.0 pg   MCHC 31.8 30.0 - 36.0 g/dL   RDW 17.2 (H) 11.5 - 15.5 %   Platelets 206 150 - 400 K/uL   nRBC 0.0 0.0 - 0.2 %   Neutrophils Relative % 67 %   Neutro Abs 3.7 1.7 - 7.7 K/uL   Lymphocytes Relative 21 %   Lymphs Abs 1.1 0.7 - 4.0 K/uL   Monocytes Relative 8 %   Monocytes Absolute 0.4 0.1 - 1.0 K/uL   Eosinophils Relative 2 %   Eosinophils Absolute 0.1 0.0 - 0.5 K/uL   Basophils Relative 1 %   Basophils Absolute 0.0 0.0 - 0.1 K/uL   Immature Granulocytes 1 %   Abs Immature Granulocytes 0.03 0.00 - 0.07 K/uL    Comment: Performed at [facility], [address] [place], [place] [address]  Comprehensive metabolic panel     Status: Abnormal   Collection Time: 05/25/22 11:01 AM  Result Value Ref Range   Sodium 138 135 - 145 mmol/L   Potassium 3.8 3.5 - 5.1 mmol/L   Chloride 105 98 - 111 mmol/L   CO2 23 22 - 32 mmol/L   Glucose, Bld 248 (H) 70 - 99 mg/dL    Comment: Glucose reference range applies only to samples taken after fasting for at least 8 hours.   BUN 12 6 - 20 mg/dL   Creatinine, Ser 0.87 0.44 - 1.00 mg/dL   Calcium 9.6 8.9 - 10.3 mg/dL   Total Protein 7.3 6.5 - 8.1 g/dL   Albumin 3.4 (L) 3.5 - 5.0 g/dL   AST 21 15 - 41 U/L   ALT 10 0 - 44 U/L   Alkaline Phosphatase 111 38 - 126 U/L   Total Bilirubin 0.7 0.3 - 1.2 mg/dL   GFR, Estimated >60 >60 mL/min    Comment: (NOTE) Calculated using the CKD-EPI Creatinine Equation (2021)    Anion gap 10 5 - 15    Comment: Performed at [facility] [address] [place], [place] [address]  Magnesium     Status: None   Collection Time: 05/25/22 11:01 AM   Result Value Ref Range   Magnesium 2.0 1.7 - 2.4 mg/dL    Comment: Performed at [facility] [address] [place], [place] [address]  Troponin I (High Sensitivity)     Status: None   Collection Time: 05/25/22 11:01 AM  Result Value Ref Range   Troponin I (High Sensitivity) 8 <18 ng/L  Comment: (NOTE) Elevated high sensitivity troponin I (hsTnI) values and significant  changes across serial measurements may suggest ACS but many other  chronic and acute conditions are known to elevate hsTnI results.  Refer to the "Links" section for chest pain algorithms and additional  guidance. Performed at [facility], [place] [address], [place], [place] [address]   Brain natriuretic peptide     Status: Abnormal   Collection Time: 05/25/22 11:01 AM  Result Value Ref Range   B Natriuretic Peptide 744.8 (H) 0.0 - 100.0 pg/mL    Comment: Performed at [facility] [address] [place], [place] [address]  Troponin I (High Sensitivity)     Status: None   Collection Time: 05/25/22  1:00 PM  Result Value Ref Range   Troponin I (High Sensitivity) 9 <18 ng/L    Comment: (NOTE) Elevated high sensitivity troponin I (hsTnI) values and significant  changes across serial measurements may suggest ACS but many other  chronic and acute conditions are known to elevate hsTnI results.  Refer to the "Links" section for chest pain algorithms and additional  guidance. Performed at [facility], [place] [address], [place], [place] [address]   CBG monitoring, ED     Status: Abnormal   Collection Time: 05/25/22  5:07 PM  Result Value Ref Range   Glucose-Capillary 221 (H) 70 - 99 mg/dL    Comment: Glucose reference range applies only to samples taken after fasting for at least 8 hours.   DG Chest 2 View  Result Date: 05/25/2022 CLINICAL DATA:  Provided history: Chest pain. Shortness of breath. Patient reports illness for 3 weeks. History of CHF. EXAM: CHEST - 2 VIEW COMPARISON:  Prior  chest radiographs 11/10/2021 and earlier. FINDINGS: Cardiomegaly, unchanged. Central pulmonary vascular congestion. Interstitial and ill-defined airspace opacities, predominantly within the right mid and lower lung fields. Trace fissural fluid on the right. No evidence of pleural effusion or pneumothorax. No acute bony abnormality identified. IMPRESSION: Cardiomegaly with central pulmonary vascular congestion. Interstitial and ill-defined airspace opacities, predominantly within the right mid and lower lung fields, which may reflect asymmetric edema or infection. Trace fissural fluid on the right. Electronically Signed   By: Kellie Simmering D.O.   On: 05/25/2022 11:32    Pending Labs Unresulted Labs (From admission, onward)     Start     Ordered   05/26/22 7062  Basic metabolic panel  Once,   R        05/25/22 1454   05/25/22 1618  Hemoglobin A1c  Once,   R       Comments: To assess prior glycemic control    05/25/22 1618   05/25/22 1615  HIV Antibody (routine testing w rflx)  (HIV Antibody (Routine testing w reflex) panel)  Once,   R        05/25/22 1617            Vitals/Pain Today's Vitals   05/25/22 1423 05/25/22 1445 05/25/22 1524 05/25/22 1527  BP: (!) 156/142 (!) 120/103  (!) 193/137  Pulse: 91 94  (!) 105  Resp: '19 16  17  '$ Temp:   98.4 F (36.9 C)   TempSrc:   Oral   SpO2: 98% 97%  94%  PainSc:        Isolation Precautions No active isolations  Medications Medications  potassium chloride SA (KLOR-CON M) CR tablet 20 mEq (20 mEq Oral Given 05/25/22 1524)  amLODipine (NORVASC) tablet 10 mg (has  no administration in time range)  carvedilol (COREG) tablet 25 mg (has no administration in time range)  hydrALAZINE (APRESOLINE) tablet 25 mg (has no administration in time range)  spironolactone (ALDACTONE) tablet 25 mg (has no administration in time range)  escitalopram (LEXAPRO) tablet 20 mg (has no administration in time range)  hydrOXYzine (ATARAX) tablet 25 mg (has no  administration in time range)  risperiDONE (RISPERDAL) tablet 4 mg (has no administration in time range)  glipiZIDE (GLUCOTROL XL) 24 hr tablet 10 mg (has no administration in time range)  rivaroxaban (XARELTO) tablet 20 mg (has no administration in time range)  sodium chloride flush (NS) 0.9 % injection 3 mL (has no administration in time range)  sodium chloride flush (NS) 0.9 % injection 3 mL (has no administration in time range)  0.9 %  sodium chloride infusion (has no administration in time range)  ondansetron (ZOFRAN) injection 4 mg (has no administration in time range)  losartan (COZAAR) tablet 25 mg (has no administration in time range)  insulin aspart (novoLOG) injection 0-15 Units (has no administration in time range)  hydrALAZINE (APRESOLINE) injection 10 mg (has no administration in time range)  furosemide (LASIX) injection 80 mg (has no administration in time range)  ipratropium-albuterol (DUONEB) 0.5-2.5 (3) MG/3ML nebulizer solution 3 mL (has no administration in time range)  furosemide (LASIX) injection 80 mg (80 mg Intravenous Given 05/25/22 1303)    Mobility walks Low fall risk   Focused Assessments    R Recommendations: See Admitting Provider Note  Report given to:   Additional Notes:  Pt is ambulatory. Pt not requiring oxygen at this time.

## 2022-05-25 NOTE — Patient Instructions (Addendum)
You are currently in Acute Congestive Heart Failure exacerbation, with low oxygenation and will need to go to the emergency room for IV diuretics

## 2022-05-25 NOTE — Progress Notes (Signed)
Subjective:  Patient ID: [name], female    DOB: August 01, 1966  Age: 56 y.o. MRN: [id number]  CC: Cough   HPI Christinna Lai is a 56 y.o. year old female with a history of hypertension, HFpEF (EF 60 to 65% from echo of 03/2021), A-fib, obstructive sleep apnea, obesity here for an office visit.  Interval History:  Today she complains of cough, dyspnea and fever.  States cough has been present for 3 weeks. States she has intermittent dyspnea but yesterday dyspnea was severe and occurred with associated chest pain.  She has no chest pain at the moment but is dyspneic. BP is severely elevated and she has been out of her medications Last visit to CHF clinic was in 10/2021 [place] and had been out of all her medications while she was away. She is unable to speak in complete sentences due to dyspnea, oxygen saturation is 91%.  Also states that she had a fever of 103F at home but temperature is normal now. Past Medical History:  Diagnosis Date   Acute on chronic diastolic congestive heart failure ([place]) 11/02/2013   10/03/2015, 11/13/2015, 08/03/2017   Benign essential HTN 11/28/2013   Bipolar disease, chronic ([place])    Chest pain    a. 2012 Myoview: EF 63%, no isch/infarct;  b. 04/2016 Lexiscan MV: EF 73%, no ischemia/infarct-->Low risk.   Chronic diastolic CHF (congestive heart failure) ([place]) 07/23/2011   a. 2015 Echo: EF 55-60%, Gr2 DD;  b. 09/2015 Echo: EF 60-65%, no rwma, mod dil LA, PASP 4mHg.   Cor pulmonale (chronic) (HCC)    History of thyrotoxicosis    HTN (hypertension) 11/28/2013   Hypertensive heart disease 10/18/2013   Hypoglycemia    Insulin dependent type 2 diabetes mellitus, uncontrolled    Mediastinal adenopathy    Morbid obesity due to excess calories ([place] 02/19/2011   Morbid obesity with BMI of 50.0-59.9, adult (HCC)    OSA (obstructive sleep apnea) 03/06/2011   Persistent atrial fibrillation ([place] 12/09/2017    Pulmonary HTN, moderate to severe 11/03/2013   Sinusitis, chronic 01/02/2015   SVT (supraventricular tachycardia) ([place] 12/06/2013   Uncontrolled type 2 diabetes mellitus with hyperglycemia ([facility]     Past Surgical History:  Procedure Laterality Date   CARDIOVERSION N/A 04/05/2018   Procedure: CARDIOVERSION;  Surgeon: CLelon Perla MD;  [place]  Service: Cardiovascular;  Laterality: N/A;   COLONOSCOPY WITH PROPOFOL Left 07/16/2018   Procedure: COLONOSCOPY WITH PROPOFOL;  Surgeon: KRonnette Juniper MD;  [facility];  Service: Gastroenterology;  Laterality: Left;   LEFT HEART CATH AND CORONARY ANGIOGRAPHY N/A 08/04/2018   Procedure: LEFT HEART CATH AND CORONARY ANGIOGRAPHY;  Surgeon: SBelva Crome MD;  [facility];  Service: Cardiovascular;  Laterality: N/A;   None     POLYPECTOMY  07/16/2018   Procedure: POLYPECTOMY;  Surgeon: KRonnette Juniper MD;  [facility];  Service: Gastroenterology;;    Family History  Problem Relation Age of Onset   Heart failure Father    Stroke Father    Hypertension Mother    Heart disease Maternal Grandfather     Social History   Socioeconomic History   Marital status: Single    Spouse name: Not on file   Number of children: 0   Years of education: 129  Highest education level: Not on file  Occupational History   Occupation: unemployed  Tobacco Use  Smoking status: Never   Smokeless tobacco: Never  Vaping Use   Vaping Use: Never used  Substance and Sexual Activity   Alcohol use: No   Drug use: No   Sexual activity: Not Currently    Birth control/protection: None  Other Topics Concern   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 But ran out of money and is no longer attending school. (Note patient has bipolar disorder).      Born in [place] but lived in [place] before coming back to [place].        Primary language is Arabic. Lives with mother and brother.   Social Determinants of Health   Financial Resource Strain: Low Risk  (07/15/2018)   Overall Financial Resource Strain (CARDIA)    Difficulty of Paying Living Expenses: Not hard at all  Food Insecurity: No Food Insecurity (07/15/2018)   Hunger Vital Sign    Worried About Running Out of Food in the Last Year: Never true    Ran Out of Food in the Last Year: Never true  Transportation Needs: No Transportation Needs (07/15/2018)   PRAPARE - Hydrologist (Medical): No    Lack of Transportation (Non-Medical): No  Physical Activity: Not on file  Stress: No Stress Concern Present (07/15/2018)   [place]    Feeling of Stress : Only a little  Social Connections: Not on file    Allergies  Allergen Reactions   Acetaminophen Other (See Comments)    Seizure-like "fits" as a child   Caffeine     Tense, anxiety, increased urination   [place] [Dapagliflozin] Other (See Comments)    Hallucinations, drop in blood sugar   Lisinopril Rash    Rash with lisinopril; but fosinopril is ok per patient    Outpatient Medications Prior to Visit  Medication Sig Dispense Refill   amLODipine (NORVASC) 10 MG tablet Take 1 tablet (10 mg total) by mouth daily. Please fill as a 90 day supply 90 tablet 3   Blood Glucose Monitoring Suppl (ACCU-CHEK GUIDE) w/Device KIT Use to check blood sugar three times daily. 1 kit 0   Blood Pressure Monitor DEVI Please provide patient with insurance approved blood pressure monitor I10.0 1 each 0   carvedilol (COREG) 25 MG tablet Take 1 tablet (25 mg total) by mouth 2 (two) times daily with a meal. Please fill as a 90 day supply 180 tablet 1   glucose blood test strip USE AS DIRECTED TO TEST BLOOD SUGAR THREE TIMES DAILY 200 strip 12   hydrOXYzine (ATARAX/VISTARIL) 25 MG tablet Take 1 tablet (25 mg total) by mouth 3  (three) times daily as needed for itching. 60 tablet 0   Incontinence Supply Disposable (INCONTINENCE BRIEF LARGE) MISC Please provide patient with insurance approved incontinence supplies/briefs 18 each 6   Misc. Devices MISC Please provide BiPAP machine with the following: Settings 22/18 cm H2O. Medium  size [employer] Full Face Mask Simplus mask and heated  humidification. 1 each 0   Misc. Devices MISC Please provide patient a Blood Pressure Monitor w/ insurance approval. 1 Device 0   Naphazoline-Pheniramine (EYE ALLERGY RELIEF OP) Place 1 drop into both eyes daily as needed (allergy).     spironolactone (ALDACTONE) 25 MG tablet Take 1 tablet (25 mg total) by mouth daily.  90 tablet 3   torsemide (DEMADEX) 20 MG tablet Take 4 tablets (80 mg total) by mouth 2 (two) times daily. 720 tablet 1   escitalopram (LEXAPRO) 20 MG tablet Take 1 tablet (20 mg total) by mouth every morning. Please fill as a 90 day supply 90 tablet 0   glipiZIDE (GLUCOTROL XL) 10 MG 24 hr tablet Take 1 tablet (10 mg total) by mouth daily with breakfast. Please fill as a 90 day supply 90 tablet 0   hydrALAZINE (APRESOLINE) 25 MG tablet Take 1 tablet (25 mg total) by mouth every 8 (eight) hours. Please fill as a 90 day supply 270 tablet 1   losartan (COZAAR) 25 MG tablet Take 1 tablet (25 mg total) by mouth daily. Please fill as a 90 day supply 90 tablet 1   metFORMIN (GLUCOPHAGE) 1000 MG tablet Take 1 tablet (1,000 mg total) by mouth 2 (two) times daily with a meal. Please fill as a 90 day supply 180 tablet 0   risperidone (RISPERDAL) 4 MG tablet Take 1 tablet (4 mg total) by mouth daily. Please fill as a 90 day supply 90 tablet 0   rivaroxaban (XARELTO) 20 MG TABS tablet Take 1 tablet (20 mg total) by mouth daily with supper. Please fill as a 90 day supply 90 tablet 1   No facility-administered medications prior to visit.     ROS Review of Systems  Constitutional:  Positive for fever. Negative for activity change and  appetite change.  HENT:  Negative for sinus pressure and sore throat.   Respiratory:  Positive for cough, chest tightness and shortness of breath. Negative for wheezing.   Cardiovascular:  Negative for chest pain and palpitations.  Gastrointestinal:  Negative for abdominal distention, abdominal pain and constipation.  Genitourinary: Negative.   Musculoskeletal: Negative.   Psychiatric/Behavioral:  Negative for behavioral problems and dysphoric mood.     Objective:  BP (!) 194/130   Pulse (!) 104   Temp 98.5 F (36.9 C) (Oral)   Wt (!) 310 lb 12.8 oz (141 kg)   SpO2 91%   BMI 48.68 kg/m      05/25/2022    8:58 AM 11/18/2021   10:45 AM 11/15/2021   11:15 AM  BP/Weight  Systolic BP 240 973 532  Diastolic BP 992 80 77  Wt. (Lbs) 310.8 332.2   BMI 48.68 kg/m2 52.03 kg/m2     Wt Readings from Last 3 Encounters:  05/25/22 (!) 310 lb 12.8 oz (141 kg)  11/18/21 (!) 332 lb 3.2 oz (150.7 kg)  11/14/21 (!) 335 lb 11.2 oz (152.3 kg)      Physical Exam Constitutional:      Appearance: She is well-developed. She is obese.  Cardiovascular:     Rate and Rhythm: Tachycardia present.     Heart sounds: Normal heart sounds. No murmur heard. Pulmonary:     Effort: Pulmonary effort is normal.     Breath sounds: No wheezing or rales.     Comments: Dyspneic at rest Distant breath sounds Chest:     Chest wall: No tenderness.  Abdominal:     General: Bowel sounds are normal. There is no distension.     Palpations: Abdomen is soft. There is no mass.     Tenderness: There is no abdominal tenderness.  Musculoskeletal:        General: Normal range of motion.     Right lower leg: No edema.     Left lower leg: No edema.  Neurological:  Mental Status: She is alert and oriented to person, place, and time.  Psychiatric:        Mood and Affect: Mood normal.        Latest Ref Rng & Units 11/15/2021    3:59 AM 11/14/2021    4:10 AM 11/13/2021    4:51 AM  CMP  Glucose 70 - 99 mg/dL 156   168  130   BUN 6 - 20 mg/dL _0 Creatinine 0.44 - 1.00 mg/dL 1.13  1.01  0.94   Sodium 135 - 145 mmol/L 139  136  139   Potassium 3.5 - 5.1 mmol/L 4.3  3.8  3.4   Chloride 98 - 111 mmol/L 104  102  104   CO2 22 - 32 mmol/L _1 Calcium 8.9 - 10.3 mg/dL 9.0  9.3  8.8     Lipid Panel     Component Value Date/Time   CHOL 129 10/02/2020 1511   TRIG 214 (H) 10/02/2020 1511   HDL 43 10/02/2020 1511   CHOLHDL 3.0 10/02/2020 1511   CHOLHDL 2.9 12/16/2019 0302   VLDL 11 12/16/2019 0302   LDLCALC 52 10/02/2020 1511    CBC    Component Value Date/Time   WBC 4.1 11/13/2021 0451   RBC 4.04 11/13/2021 0451   HGB 10.6 (L) 11/13/2021 0451   HGB 12.5 08/20/2021 1522   HCT 34.5 (L) 11/13/2021 0451   HCT 39.8 08/20/2021 1522   PLT 190 11/13/2021 0451   PLT 200 08/20/2021 1522   MCV 85.4 11/13/2021 0451   MCV 84 08/20/2021 1522   MCH 26.2 11/13/2021 0451   MCHC 30.7 11/13/2021 0451   RDW 15.8 (H) 11/13/2021 0451   RDW 16.0 (H) 08/20/2021 1522   LYMPHSABS 0.9 11/10/2021 1250   LYMPHSABS 1.1 07/20/2018 1140   MONOABS 0.4 11/10/2021 1250   EOSABS 0.1 11/10/2021 1250   EOSABS 0.0 07/20/2018 1140   BASOSABS 0.0 11/10/2021 1250   BASOSABS 0.0 07/20/2018 1140    Lab Results  Component Value Date   HGBA1C 6.2 (H) 11/11/2021    Assessment & Plan:  1. Hypertensive heart disease with chronic diastolic congestive heart failure (HCC) Acute CHF exacerbation in the setting of possible pulmonary edema EF of 60 to 65% from echo 03/2021 CHF exacerbation secondary to running out of medications She needs IV diuretics Referred to the ED via EMS  2. Hypoxia Oxygen saturation of 91% Dyspneic at rest Will need to rule out pulmonary edema  3. Persistent atrial fibrillation Tachycardic Has been out of anticoagulant and beta-blocker  4.  Type 2 diabetes mellitus without insulin use with other specified complications ([place]) Controlled with A1c of 6.2 A1c due but given she is  being transported to the ED this will need to be done at her next visit. Counseled on Diabetic diet, my plate method, 540 minutes of moderate intensity exercise/week Blood sugar logs with fasting goals of 80-120 mg/dl, random of less than 180 and in the event of sugars less than 60 mg/dl or greater than 400 mg/dl encouraged to notify the clinic. Advised on the need for annual eye exams, annual foot exams, Pneumonia vaccine.  - glipiZIDE (GLUCOTROL XL) 10 MG 24 hr tablet; Take 1 tablet (10 mg total) by mouth daily with breakfast. Please fill as a 90 day supply  Dispense: 90 tablet; Refill: 1 - metFORMIN (GLUCOPHAGE) 1000 MG tablet; Take 1 tablet (1,000 mg total) by mouth 2 (  two) times daily with a meal. Please fill as a 90 day supply  Dispense: 180 tablet; Refill: 1   No orders of the defined types were placed in this encounter.   Follow-up: Return in about 1 month (around 06/25/2022) for Medical conditions with PCP.       Charlott Rakes, MD, FAAFP. [facility] and Jacobus Luquillo, [place]   05/25/2022, 9:30 AM

## 2022-05-26 ENCOUNTER — Encounter (HOSPITAL_COMMUNITY): Payer: Self-pay | Admitting: Internal Medicine

## 2022-05-26 ENCOUNTER — Observation Stay (HOSPITAL_COMMUNITY): Payer: Self-pay

## 2022-05-26 DIAGNOSIS — I5031 Acute diastolic (congestive) heart failure: Secondary | ICD-10-CM

## 2022-05-26 DIAGNOSIS — E876 Hypokalemia: Secondary | ICD-10-CM

## 2022-05-26 DIAGNOSIS — I10 Essential (primary) hypertension: Secondary | ICD-10-CM | POA: Diagnosis present

## 2022-05-26 LAB — BASIC METABOLIC PANEL
Anion gap: 12 (ref 5–15)
BUN: 16 mg/dL (ref 6–20)
CO2: 24 mmol/L (ref 22–32)
Calcium: 9 mg/dL (ref 8.9–10.3)
Chloride: 101 mmol/L (ref 98–111)
Creatinine, Ser: 1.05 mg/dL — ABNORMAL HIGH (ref 0.44–1.00)
GFR, Estimated: >60 mL/min (ref >60)
Glucose, Bld: 263 mg/dL — ABNORMAL HIGH (ref 70–99)
Potassium: 3.2 mmol/L — ABNORMAL LOW (ref 3.5–5.1)
Sodium: 137 mmol/L (ref 135–145)

## 2022-05-26 LAB — GLUCOSE, CAPILLARY
Glucose-Capillary: 235 mg/dL — ABNORMAL HIGH (ref 70–99)
Glucose-Capillary: 387 mg/dL — ABNORMAL HIGH (ref 70–99)
Glucose-Capillary: 200 mg/dL — ABNORMAL HIGH (ref 70–99)
Glucose-Capillary: 316 mg/dL — ABNORMAL HIGH (ref 70–99)

## 2022-05-26 LAB — ECHOCARDIOGRAM COMPLETE
AR max vel: 2.36 cm2
AV Area VTI: 2.85 cm2
AV Area mean vel: 2.51 cm2
AV Mean grad: 3 mmHg
AV Peak grad: 5.1 mmHg
Ao pk vel: 1.13 m/s
Height: 67 in
S' Lateral: 2.3 cm
Weight: 4892.45 oz

## 2022-05-26 MED ORDER — POTASSIUM CHLORIDE CRYS ER 20 MEQ PO TBCR
20.0000 meq | EXTENDED_RELEASE_TABLET | Freq: Once | ORAL | Status: AC
  Administered 2022-05-26: 20 meq via ORAL
  Filled 2022-05-26: qty 1

## 2022-05-26 MED ORDER — POTASSIUM CHLORIDE CRYS ER 20 MEQ PO TBCR
40.0000 meq | EXTENDED_RELEASE_TABLET | Freq: Once | ORAL | Status: AC
  Administered 2022-05-26: 40 meq via ORAL
  Filled 2022-05-26: qty 2

## 2022-05-26 MED ORDER — CARVEDILOL 12.5 MG PO TABS
25.0000 mg | ORAL_TABLET | Freq: Two times a day (BID) | ORAL | Status: DC
  Administered 2022-05-26 – 2022-05-28 (×7): 25 mg via ORAL
  Filled 2022-05-26 (×7): qty 2

## 2022-05-26 NOTE — Assessment & Plan Note (Signed)
Renal function stable with serum cr at 1,0, K is 3,2 and serum bicarbonate at 24. Continue K correction with Kcl and follow up renal function and electrolytes in am.

## 2022-05-26 NOTE — Progress Notes (Signed)
Heart Failure Navigation Team Progress Note  PCP: [name], NP Primary Cardiologist: Thresa Ross., MD Admitted from: home  Ms. Allerton is resting in bed, HOB 15 on room air. Appears comfortably with no noticeable SOB. BP elevated at 149/99 (112).  Pt states she ran out of her medication while in [place] visiting her family. She was there for 3 months helping her family transition to [place] away from war. Endorses medication compliance until empty bottles. Re-educated on dietary and fluid modifications.   ECHO/LVEF: pending update.  7/22: 60-65%, severe LVH, mod-sev TVR   Past Medical History:  Diagnosis Date   Acute on chronic diastolic congestive heart failure ([place]) 11/02/2013   10/03/2015, 11/13/2015, 08/03/2017   Benign essential HTN 11/28/2013   Bipolar disease, chronic ([place])    Chest pain    a. 2012 Myoview: EF 63%, no isch/infarct;  b. 04/2016 Lexiscan MV: EF 73%, no ischemia/infarct-->Low risk.   Chronic diastolic CHF (congestive heart failure) ([place]) 07/23/2011   a. 2015 Echo: EF 55-60%, Gr2 DD;  b. 09/2015 Echo: EF 60-65%, no rwma, mod dil LA, PASP 54mHg.   Cor pulmonale (chronic) (HCC)    History of thyrotoxicosis    HTN (hypertension) 11/28/2013   Hypertensive heart disease 10/18/2013   Hypoglycemia    Insulin dependent type 2 diabetes mellitus, uncontrolled    Mediastinal adenopathy    Morbid obesity due to excess calories ([facility] 02/19/2011   Morbid obesity with BMI of 50.0-59.9, adult (HCC)    OSA (obstructive sleep apnea) 03/06/2011   Persistent atrial fibrillation ([facility] 12/09/2017   Pulmonary HTN, moderate to severe 11/03/2013   Sinusitis, chronic 01/02/2015   SVT (supraventricular tachycardia) ([facility] 12/06/2013   Uncontrolled type 2 diabetes mellitus with hyperglycemia ([facility]     Social History   Socioeconomic History   Marital status: Single    Spouse name: Not on file   Number of children: 0   Years of education: 18   Highest education level: Not on file   Occupational History   Occupation: unemployed  Tobacco Use   Smoking status: Never   Smokeless tobacco: Never  Vaping Use   Vaping Use: Never used  Substance and Sexual Activity   Alcohol use: No   Drug use: No   Sexual activity: Not Currently    Birth control/protection: None  Other Topics Concern   Not on file  Social History Narrative   [place]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 But ran out of money and is no longer attending school. (Note patient has bipolar disorder).      [place]       Primary language is Arabic. Lives with mother and brother.   Social Determinants of Health   Financial Resource Strain: Low Risk  (05/26/2022)   Overall Financial Resource Strain (CARDIA)    Difficulty of Paying Living Expenses: Not very hard  Food Insecurity: No Food Insecurity (05/26/2022)   Hunger Vital Sign    Worried About Running Out of Food in the Last Year: Never true    Ran Out of Food in the Last Year: Never true  Transportation Needs: No Transportation Needs (05/26/2022)   PRAPARE - THydrologist(Medical): No    Lack of Transportation (Non-Medical): No  Physical Activity: Not on file  Stress: No Stress Concern Present (07/15/2018)  [place] Questionnaire    Feeling of Stress : Only a little  Social Connections: Not on file     Heart & Vascular Transition [facility] Care Clinic follow-up:  Wednesday 9/6 @ 10AM.  Confirmed transportation. Pt does not drive but does have family that will get her to appointments.   Immediate social needs: none.    Pricilla Holm, MSN, RN

## 2022-05-26 NOTE — Progress Notes (Signed)
Mobility Specialist - Progress Note   05/26/22 1348  Mobility  Activity Refused mobility   Pt received in bed and refusing mobility. Pt stated she was tired and her leg was hurting to bad to get up. I offered to return at a later time today and she said she would not get up to walk later.   Larey Seat

## 2022-05-26 NOTE — Progress Notes (Addendum)
Rounding Note    Patient Name: [name] Date of Encounter: 05/26/2022  [place] Cardiologist: [name], MD   Subjective  hx of  HFpEF, persistent atrial fibrillation on Xarelto, hypertension, obesity, OSA not compliant with CPAP ran out of her medications on trip presented with CHF  No chest pain, no SOB  feels better  Inpatient Medications    Scheduled Meds:  amLODipine  10 mg Oral Daily   carvedilol  25 mg Oral BID WC   escitalopram  20 mg Oral q morning   furosemide  80 mg Intravenous BID   glipiZIDE  10 mg Oral Q breakfast   hydrALAZINE  25 mg Oral Q8H   insulin aspart  0-15 Units Subcutaneous TID WC   losartan  25 mg Oral Daily   pneumococcal 20-valent conjugate vaccine  0.5 mL Intramuscular Tomorrow-1000   potassium chloride  20 mEq Oral BID   risperidone  4 mg Oral Daily   rivaroxaban  20 mg Oral Q supper   sodium chloride flush  3 mL Intravenous Q12H   spironolactone  25 mg Oral Daily   Continuous Infusions:  sodium chloride     PRN Meds: sodium chloride, hydrALAZINE, hydrocortisone cream, hydrOXYzine, ipratropium-albuterol, ondansetron (ZOFRAN) IV, sodium chloride flush   Vital Signs    Vitals:   05/25/22 2329 05/25/22 2345 05/26/22 0319 05/26/22 0325  BP: (!) 163/105 (!) 155/93 (!) 136/93   Pulse: (!) 109 100 93   Resp: '16 15 20   '$ Temp: 98 F (36.7 C)  98 F (36.7 C)   TempSrc: Oral  Oral   SpO2: 95% 94% 98%   Weight:    (!) 138.7 kg  Height:        Intake/Output Summary (Last 24 hours) at 05/26/2022 0939 Last data filed at 05/26/2022 0600 Gross per 24 hour  Intake 1320 ml  Output 4100 ml  Net -2780 ml      05/26/2022    3:25 AM 05/25/2022    6:22 PM 05/25/2022    8:58 AM  Last 3 Weights  Weight (lbs) 305 lb 12.5 oz 311 lb 1.1 oz 310 lb 12.8 oz  Weight (kg) 138.7 kg 141.1 kg 140.978 kg      Telemetry    A fib mostly rate controlled occ elevated HR and occ NSVT - Personally Reviewed  ECG    No new - Personally  Reviewed  Physical Exam   GEN: No acute distress.   Neck: No JVD sitting up Cardiac: irreg irreg, no murmurs, rubs, or gallops.  Respiratory: Clear to auscultation bilaterally. GI: Soft, nontender, non-distended  MS: No edema; No deformity. Neuro:  Nonfocal  Psych: Normal affect   Labs    High Sensitivity Troponin:   Recent Labs  Lab 05/25/22 1101 05/25/22 1300  TROPONINIHS 8 9     Chemistry Recent Labs  Lab 05/25/22 1101 05/26/22 0401  NA 138 137  K 3.8 3.2*  CL 105 101  CO2 23 24  GLUCOSE 248* 263*  BUN 12 16  CREATININE 0.87 1.05*  CALCIUM 9.6 9.0  MG 2.0  --   PROT 7.3  --   ALBUMIN 3.4*  --   AST 21  --   ALT 10  --   ALKPHOS 111  --   BILITOT 0.7  --   GFRNONAA >60 >60  ANIONGAP 10 12    Lipids No results for input(s): "CHOL", "TRIG", "HDL", "LABVLDL", "LDLCALC", "CHOLHDL" in the last 168 hours.  Hematology  Recent Labs  Lab 05/25/22 1101  WBC 5.4  RBC 5.19*  HGB 13.9  HCT 43.7  MCV 84.2  MCH 26.8  MCHC 31.8  RDW 17.2*  PLT 206   Thyroid No results for input(s): "TSH", "FREET4" in the last 168 hours.  BNP Recent Labs  Lab 05/25/22 1101  BNP 744.8*    DDimer No results for input(s): "DDIMER" in the last 168 hours.   Radiology    DG Chest 2 View  Result Date: 05/25/2022 CLINICAL DATA:  Provided history: Chest pain. Shortness of breath. Patient reports illness for 3 weeks. History of CHF. EXAM: CHEST - 2 VIEW COMPARISON:  Prior chest radiographs 11/10/2021 and earlier. FINDINGS: Cardiomegaly, unchanged. Central pulmonary vascular congestion. Interstitial and ill-defined airspace opacities, predominantly within the right mid and lower lung fields. Trace fissural fluid on the right. No evidence of pleural effusion or pneumothorax. No acute bony abnormality identified. IMPRESSION: Cardiomegaly with central pulmonary vascular congestion. Interstitial and ill-defined airspace opacities, predominantly within the right mid and lower lung fields,  which may reflect asymmetric edema or infection. Trace fissural fluid on the right. Electronically Signed   By: Kellie Simmering D.O.   On: 05/25/2022 11:32    Cardiac Studies   Cath: 07/2018   Widely patent coronary arteries. 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  Courses between PA and AO.  Mild dynamic compression is noted. Hypercontractile left ventricle.  Marked elevation in LVEDP, 26 mmHg.  EF greater than 65%. Ventricular fibrillation treated with defibrillation x1. Conversion to normal sinus rhythm after defibrillation.   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 Resume anticoagulation, likely Xarelto since coronaries are clean. Further management per treating team. Discontinue nitroglycerin IV.   Recommend to resume Rivaroxaban, at currently prescribed dose and frequency     Echo: 03/2021   IMPRESSIONS     1. Left ventricular ejection fraction, by estimation, is 60 to 65%. The  left ventricle has normal function. The left ventricle has no regional  wall motion abnormalities. There is severe left ventricular hypertrophy.  Left ventricular diastolic function  could not be evaluated.   2. Right ventricular systolic function is normal. The right ventricular  size is normal. There is moderately elevated pulmonary artery systolic  pressure.   3. Left atrial size was severely dilated.   4. Right atrial size was mildly dilated.   5. The mitral valve is normal in structure. Mild mitral valve  regurgitation. No evidence of mitral stenosis.   6. Tricuspid valve regurgitation is moderate to severe.   7. The aortic valve is tricuspid. Aortic valve regurgitation is trivial.  No aortic stenosis is present.   8. The inferior vena cava is dilated in size with >50% respiratory  variability, suggesting right atrial pressure of 8 mmHg.   FINDINGS   Left Ventricle: Left ventricular ejection fraction, by  estimation, is 60  to 65%. The left ventricle has normal function. The left ventricle has no  regional wall motion abnormalities. The left ventricular internal cavity  size was normal in size. There is   severe left ventricular hypertrophy. Left ventricular diastolic function  could not be evaluated due to atrial fibrillation. Left ventricular  diastolic function could not be evaluated.   Right Ventricle: The right ventricular size is normal. Right ventricular  systolic function is normal. There is moderately elevated pulmonary artery  systolic pressure. The tricuspid regurgitant velocity is 2.86 m/s, and  with an assumed right atrial  pressure of 15 mmHg, the estimated right ventricular systolic pressure is  40.9 mmHg.   Left Atrium: Left atrial size was severely dilated.   Right Atrium: Right atrial size was mildly dilated.   Pericardium: Trivial pericardial effusion is present.   Mitral Valve: The mitral valve is normal in structure. Mild mitral annular  calcification. Mild mitral valve regurgitation. No evidence of mitral  valve stenosis.   Tricuspid Valve: The tricuspid valve is normal in structure. Tricuspid  valve regurgitation is moderate to severe. No evidence of tricuspid  stenosis.   Aortic Valve: The aortic valve is tricuspid. Aortic valve regurgitation is  trivial. No aortic stenosis is present.   Pulmonic Valve: The pulmonic valve was normal in structure. Pulmonic valve  regurgitation is not visualized. No evidence of pulmonic stenosis.   Aorta: The aortic root is normal in size and structure.   Venous: The inferior vena cava is dilated in size with greater than 50%  respiratory variability, suggesting right atrial pressure of 8 mmHg.   IAS/Shunts: No atrial level shunt detected by color flow Doppler.   Patient Profile     56 y.o. female with hx of  HFpEF, persistent atrial fibrillation on Xarelto, hypertension, obesity, OSA not compliant with CPAP now  admitted with acute CHF.    Assessment & Plan     HFpEF -- Echocardiogram 03/2021 showed LVEF of 60 to 65%, she has been out of her home medications including her torsemide 80 mg twice daily for several weeks.  BNP 744, chest x-ray with edema.  She has been given IV Lasix 80 mg x 1 in the ED with some urine output noted -echo pending -- Start IV Lasix 80 mg twice daily -- neg 2,780 since admit - wt down from 141.1 Kg to 138.7 Kg  (loss of 5.28 lbs )Daily weights, I&O's -- Resume Jardiance 10 mg daily  Hypokalemia with K+ 3.2 today will give an extra 20 meq today for total of 60 meq    Persistent atrial fibrillation -- Rate controlled in the 80-90 range on telemetry though episode of higher rate up to 130s during the night.   -- Resume home Xarelto, Coreg 25 mg twice daily  NSVT on tele  - keep K+ 4.0 and Mg+ 2.0  - on BB   Hypertension -- Blood pressure significantly elevated in the ED and still with some elevation 149/99 -- Resume carvedilol 25 mg twice daily, losartan 25 mg daily, spironolactone 25 mg daily   Diabetes -- Last hemoglobin A1c 6.2 -- Continue Jardiance -- SSI while admitted   OSA -- Not compliant with CPAP   Obesity -- Diet and lifestyle modification      For questions or updates, please contact [place] Please consult [email or web address] for contact info under        Signed, Cecilie Kicks, NP  05/26/2022, 9:39 AM     Attending Note:   The patient was seen and examined.  Agree with assessment and plan as noted above.  Changes made to the above note as needed.  Patient seen and independently examined with Cecilie Kicks, NP .   We discussed all aspects of the encounter. I agree with the assessment and plan as stated above.    Acute on chronic diastolic CHF:   feels better after diuresis.  Is not yet back to baseline .  Increase coreg to 25 bid .  This will help with tachycardia   2.  OSA :  needs to resume her  CPAP   3. Morbid obesity ;   continue diet  4.  Hypokalemia:  replace potassium   5.  HTN;   resume home meds.   Likely will be able to [facility] home tomorrow .     I have spent a total of 40 minutes with patient reviewing hospital  notes , telemetry, EKGs, labs and examining patient as well as establishing an assessment and plan that was discussed with the patient.  > 50% of time was spent in direct patient care.    Thayer Headings, Brooke Bonito., MD, [facility] 05/26/2022, 10:56 AM [address],  [place] Pager [phone]

## 2022-05-26 NOTE — Hospital Course (Signed)
[name] was admitted to the hospital with the working diagnosis of decompensated heart failure.   56 yo female with the past medical history of hypertension, T2DM, heart failure, obesity class 3, pulmonary hypertension, and atrial fibrillation who presented with dyspnea. Patient was traveling oversees and run out of her medications about one week prior to coming to the ED. Patient developed, dyspnea, lower extremity edema and orthopnea. On her initial physical examination her blood pressure was 156/142, and 120/103 with HR 91 and 95, RR 19 and 02 saturation 97%, lungs with no wheezing, heart with S1 and S2 present and rhythmic, abdomen not distended, positive lower extremity edema.   Na 138, K 3,8 Cl 105 bicarbonate 23 glucose 248, bun 12 cr 0,87  BNP 744 High sensitive troponin 8 and 9  Wbc 5,4 hgb 13,9 plt 206   Chest radiograph with cardiomegaly, bilateral hilar vascular congestion with bilateral interstitial infiltrates and cephalization of the vasculature, fluid in the right fissure.   EKG 99 bpm, right axis deviation, normal qtc, atrial fibrillation rhythm with no significant ST segment or T wave changes.   Patient was placed on IV furosemide for diuresis.

## 2022-05-26 NOTE — Progress Notes (Addendum)
Progress Note   Patient: [name] OJJ:[id number] DOB: [birth year] DOA: 05/25/2022     0 DOS: the patient was seen and examined on 05/26/2022   Brief hospital course: [name] was admitted to the hospital with the working diagnosis of decompensated heart failure.   56 yo female with the past medical history of hypertension, T2DM, heart failure, obesity class 3, pulmonary hypertension, and atrial fibrillation who presented with dyspnea. Patient was traveling oversees and run out of her medications about one week prior to coming to the ED. Patient developed, dyspnea, lower extremity edema and orthopnea. On her initial physical examination her blood pressure was 156/142, and 120/103 with HR 91 and 95, RR 19 and 02 saturation 97%, lungs with no wheezing, heart with S1 and S2 present and rhythmic, abdomen not distended, positive lower extremity edema.   Na 138, K 3,8 Cl 105 bicarbonate 23 glucose 248, bun 12 cr 0,87  BNP 744 High sensitive troponin 8 and 9  Wbc 5,4 hgb 13,9 plt 206   Chest radiograph with cardiomegaly, bilateral hilar vascular congestion with bilateral interstitial infiltrates and cephalization of the vasculature, fluid in the right fissure.   EKG 99 bpm, right axis deviation, normal qtc, atrial fibrillation rhythm with no significant ST segment or T wave changes.   Patient was placed on IV furosemide for diuresis.   Assessment and Plan: * Acute diastolic CHF (congestive heart failure) (HCC) Echocardiogram with preserved LV systolic function EF 60 to 65%, severe LVH, RV systolic function preserved. Severe dilatation of left atrium. Small pericardial effusion   Urine output is 9,371 ml Systolic blood pressure is 99 to 104 mmHg.  Plan to continue diuresis with furosemide 80 mg IV q12. Continue with carvedilol, losartan and spironolactone.  Possible transition to entresto during this hospitalization.  Allergic to SGLT 2 inh.   Essential hypertension Blood pressure has  improved. Plan to continue diuresis with furosemide and spironolactone. Continue with carvedilol, hydralazin and losartan.   Non-insulin dependent type 2 diabetes mellitus ([place]) Uncontrolled hyperglycemia.  Capillary glucose 248 to 263  Plan to continue with insulin sliding scale for glucose cover and monitoring  Continue with  Glipizide for now.  Patient is tolerating po well.    Persistent atrial fibrillation (HCC) Continue rate control with carvedilol and anticoagulation with rivaroxaban.  Continue telemetry monitoring   Hypokalemia Renal function stable with serum cr at 1,0, K is 3,2 and serum bicarbonate at 24. Continue K correction with Kcl and follow up renal function and electrolytes in am.   Bipolar 1 disorder, mixed, moderate (HCC) Continue with risperidone and escitalopram.   Class 3 severe obesity due to excess calories with serious comorbidity and body mass index (BMI) of 60.0 to 69.9 in adult (HCC) Calculated BMI is 47         Subjective: Patient is feeling better but not yet back to her baseline, no chest pain   Physical Exam: Vitals:   05/26/22 0325 05/26/22 0945 05/26/22 1140 05/26/22 1350  BP:  (!) 149/99 99/61 104/63  Pulse:  96 89   Resp:  15 18   Temp:  98 F (36.7 C) 98.7 F (37.1 C)   TempSrc:  Oral Oral   SpO2:  92% 90%   Weight: (!) 138.7 kg     Height:       Neurology somnolent at the time of my visit but easy to arouse and responds to questions appropriately  ENT with no pallor Cardiovascular with S1 and S2 present, irregularly  irregular with no gallops Respiratory with no rales or wheezing Abdomen not distended Lower extremity with trace edema  Data Reviewed:    Family Communication: no family at the bedside   Disposition: Status is: Inpatient Remains inpatient appropriate because: heart failure   Planned Discharge Destination: Home     Author: Tawni Millers, MD 05/26/2022 4:23 PM  For on call review  [email or web address].

## 2022-05-26 NOTE — Assessment & Plan Note (Signed)
Calculated BMI is 47

## 2022-05-26 NOTE — Assessment & Plan Note (Addendum)
Continue rate control with carvedilol and anticoagulation with rivaroxaban.  Continue telemetry monitoring

## 2022-05-26 NOTE — Inpatient Diabetes Management (Addendum)
Inpatient Diabetes Program Recommendations  AACE/ADA: New Consensus Statement on Inpatient Glycemic Control (2015)  Target Ranges:  Prepandial:   less than 140 mg/dL      Peak postprandial:   less than 180 mg/dL (1-2 hours)      Critically ill patients:  140 - 180 mg/dL   Lab Results  Component Value Date   GLUCAP 387 (H) 05/26/2022   HGBA1C 10.5 (H) 05/25/2022    Review of Glycemic Control  Latest Reference Range & Units 05/26/22 06:05 05/26/22 11:37  Glucose-Capillary 70 - 99 mg/dL 235 (H) 387 (H)   Diabetes history: DM 2 Outpatient Diabetes medications: Glipizide 10 mg Daily, Metformin 1000 mg bid Current orders for Inpatient glycemic control:  Glipizide 10 mg Daily Novolog 0-15 units tid  A1c 10.5% on 8/28  -  Increase Glipizide to 10 mg bid   Spoke with pt at bedside regarding A1c of 10.5% and glucose control at home. Pt reports her glucose numbers have been in the 200-300 range at home. Pt reports going to the [facility] for DM follow up. Pt reports not seeing her PCP in almost a year. Pt reports needing a new glucose meter at time of discharge. Pt said her A1c was at an 8%.   Pt said she use to be on Trulicity and her glucose trends were better. Pt reported going out of the country to her home country and when she came back she didn't have her Trulicity. Pt also reported needing to improve on her dietary habits. Discussed changes in diet. Discussed glucose and A1c goals. Encouraged follow up at the [facility] for medication adjustments. Pt reports not wanting to do insulin at this time.   D/c needs: Blood glucose meter kit order # [id number] Trulicity 3.33 mg weekly #[id number] Insulin pen needles order # [id number]  Thanks,  Tama Headings RN, MSN, BC-ADM Inpatient Diabetes Coordinator Team Pager [phone] (8a-5p)

## 2022-05-26 NOTE — Assessment & Plan Note (Addendum)
Echocardiogram with preserved LV systolic function EF 60 to 65%, severe LVH, RV systolic function preserved. Severe dilatation of left atrium. Small pericardial effusion   Urine output is 9,758 ml Systolic blood pressure is 99 to 104 mmHg.  Plan to continue diuresis with furosemide 80 mg IV q12. Continue with carvedilol, losartan and spironolactone.  Possible transition to entresto during this hospitalization.  Allergic to SGLT 2 inh.

## 2022-05-26 NOTE — Progress Notes (Signed)
  Echocardiogram 2D Echocardiogram has been performed.  [name] 05/26/2022, 10:29 AM

## 2022-05-26 NOTE — Progress Notes (Signed)
Paged on call cardiologist in regards to pt's afib. HR 110s-130s on telemetry monitor. BP elevated but re-assessed.

## 2022-05-26 NOTE — Assessment & Plan Note (Addendum)
Uncontrolled hyperglycemia.  Capillary glucose 248 to 263  Plan to continue with insulin sliding scale for glucose cover and monitoring  Continue with  Glipizide for now.  Patient is tolerating po well.

## 2022-05-26 NOTE — Assessment & Plan Note (Addendum)
Continue with risperidone and escitalopram.

## 2022-05-26 NOTE — Assessment & Plan Note (Addendum)
Blood pressure has improved. Plan to continue diuresis with furosemide and spironolactone. Continue with carvedilol, hydralazin and losartan.

## 2022-05-27 LAB — BASIC METABOLIC PANEL
Anion gap: 7 (ref 5–15)
BUN: 19 mg/dL (ref 6–20)
CO2: 26 mmol/L (ref 22–32)
Calcium: 8.4 mg/dL — ABNORMAL LOW (ref 8.9–10.3)
Chloride: 102 mmol/L (ref 98–111)
Creatinine, Ser: 1.16 mg/dL — ABNORMAL HIGH (ref 0.44–1.00)
GFR, Estimated: 56 mL/min — ABNORMAL LOW (ref >60)
Glucose, Bld: 196 mg/dL — ABNORMAL HIGH (ref 70–99)
Potassium: 4.1 mmol/L (ref 3.5–5.1)
Sodium: 135 mmol/L (ref 135–145)

## 2022-05-27 LAB — MAGNESIUM: Magnesium: 2 mg/dL (ref 1.7–2.4)

## 2022-05-27 LAB — GLUCOSE, CAPILLARY
Glucose-Capillary: 186 mg/dL — ABNORMAL HIGH (ref 70–99)
Glucose-Capillary: 200 mg/dL — ABNORMAL HIGH (ref 70–99)
Glucose-Capillary: 268 mg/dL — ABNORMAL HIGH (ref 70–99)
Glucose-Capillary: 194 mg/dL — ABNORMAL HIGH (ref 70–99)
Glucose-Capillary: 342 mg/dL — ABNORMAL HIGH (ref 70–99)

## 2022-05-27 MED ORDER — RISPERIDONE 2 MG PO TABS
2.0000 mg | ORAL_TABLET | Freq: Every day | ORAL | Status: DC
  Administered 2022-05-27: 2 mg via ORAL
  Filled 2022-05-27 (×2): qty 1

## 2022-05-27 MED ORDER — FUROSEMIDE 40 MG PO TABS
40.0000 mg | ORAL_TABLET | Freq: Two times a day (BID) | ORAL | Status: DC
  Administered 2022-05-27 – 2022-05-28 (×3): 40 mg via ORAL
  Filled 2022-05-27 (×3): qty 1

## 2022-05-27 NOTE — TOC Initial Note (Signed)
Transition of Care [facility]) - Initial/Assessment Note    Patient Details  Name: [name] MRN: [id number] Date of Birth: 1966-08-02  Transition of Care [facility] [place]) CM/SW Contact:    Zenon Mayo, RN Phone Number: 05/27/2022, 9:06 PM  Clinical Narrative:                 from home, CHF, conts on iv lasix, will change to po later today. On xarelto for chronic afib. TOC following.        Patient Goals and CMS Choice        Expected Discharge Plan and Services                                                Prior Living Arrangements/Services                       Activities of Daily Living Home Assistive Devices/Equipment: Shower chair with back ADL Screening (condition at time of admission) Patient's cognitive ability adequate to safely complete daily activities?: Yes Is the patient deaf or have difficulty hearing?: No Does the patient have difficulty seeing, even when wearing glasses/contacts?: No Does the patient have difficulty concentrating, remembering, or making decisions?: No Patient able to express need for assistance with ADLs?: Yes Does the patient have difficulty dressing or bathing?: No Independently performs ADLs?: Yes (appropriate for developmental age) Does the patient have difficulty walking or climbing stairs?: Yes Weakness of Legs: Both Weakness of Arms/Hands: None  Permission Sought/Granted                  Emotional Assessment              Admission diagnosis:  CHF exacerbation ([place]) [I50.9] Acute on chronic diastolic congestive heart failure ([place]) [I50.33] Heart failure ([place]) [I50.9] Patient Active Problem List   Diagnosis Date Noted   Essential hypertension 05/26/2022   Hypokalemia 05/26/2022   CHF exacerbation ([place]) 17/91/5056   Acute diastolic CHF (congestive heart failure) ([place]) 11/10/2021   Acute on chronic congestive heart failure ([place]) 04/18/2021   Acute on chronic heart failure ([place]) 04/17/2021    Elevated troponin    Obesity hypoventilation syndrome ([place]) 12/15/2019   Depression 12/15/2019   ILD (interstitial lung disease) ([place]) 05/28/2019   Exertional angina ([place]) 05/28/2019   AKI (acute kidney injury) ([place])    Hematochezia 07/14/2018   Acute posthemorrhagic anemia 07/14/2018   GIB (gastrointestinal bleeding) 07/14/2018   Persistent atrial fibrillation ([place])    Uncontrolled type 2 diabetes mellitus with hyperglycemia ([place])    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   Pyrexia    Dyspnea 11/13/2015   Mediastinal adenopathy 11/13/2015   Abnormal CT scan, chest 11/11/2015   Chest pain 11/11/2015   Depression (emotion) 03/07/2015   Noninfectious gastroenteritis and colitis 01/02/2015   Sinusitis, chronic 01/02/2015   Midline low back pain without sciatica 09/10/2014   Bipolar 1 disorder, mixed, moderate ([place]) 07/02/2014   Stress incontinence 07/02/2014   Mania ([place]) 12/10/2013   Speech abnormality 12/08/2013   SVT (supraventricular tachycardia) ([place]) 12/06/2013   H/O non-insulin dependent diabetes mellitus 11/28/2013   Pulmonary HTN, moderate to severe 11/03/2013   Acute on chronic diastolic congestive heart failure ([place]) 11/02/2013   Hypertensive heart disease 10/18/2013   Non-insulin dependent type 2 diabetes mellitus ([place]) 10/18/2013   Chronic diastolic heart failure ([place])  07/23/2011   Class 3 severe obesity due to excess calories with serious comorbidity and body mass index (BMI) of 60.0 to 69.9 in adult [facility]) 02/19/2011   Non-ST elevation (NSTEMI) myocardial infarction ([place]) 04/29/2009   Bipolar disorder    PCP:  [name], NP Pharmacy:   [facility] [address] [place] [address] Phone: [phone] Fax: [place], [place] [place] [place] [address] Phone: [phone] Fax: [phone]     Social  Determinants of Health (SDOH) Interventions Food Insecurity Interventions: Intervention Not Indicated Financial Strain Interventions: Intervention Not Indicated Housing Interventions: Intervention Not Indicated Transportation Interventions: Intervention Not Indicated  Readmission Risk Interventions    04/23/2021    4:38 PM  Readmission Risk Prevention Plan  Transportation Screening Complete  HRI or [place] Complete  Social Work Consult for [place] Planning/Counseling Complete  Palliative Care Screening Not Applicable  Medication Review Press photographer) Complete

## 2022-05-27 NOTE — Progress Notes (Addendum)
Rounding Note    Patient Name: [name] Date of Encounter: 05/27/2022  [place] Cardiologist: [name], MD   Subjective   Just return from [place] and run out of medications.  Breathing improved, patient thinks she is at baseline.  No chest pain.   Inpatient Medications    Scheduled Meds:  amLODipine  10 mg Oral Daily   carvedilol  25 mg Oral BID WC   escitalopram  20 mg Oral q morning   furosemide  40 mg Oral BID   glipiZIDE  10 mg Oral Q breakfast   hydrALAZINE  25 mg Oral Q8H   insulin aspart  0-15 Units Subcutaneous TID WC   losartan  25 mg Oral Daily   pneumococcal 20-valent conjugate vaccine  0.5 mL Intramuscular Tomorrow-1000   risperidone  2 mg Oral Daily   rivaroxaban  20 mg Oral Q supper   sodium chloride flush  3 mL Intravenous Q12H   spironolactone  25 mg Oral Daily   Continuous Infusions:  sodium chloride     PRN Meds: sodium chloride, hydrALAZINE, hydrocortisone cream, hydrOXYzine, ipratropium-albuterol, ondansetron (ZOFRAN) IV, sodium chloride flush   Vital Signs    Vitals:   05/26/22 2000 05/26/22 2300 05/27/22 0400 05/27/22 0822  BP: 116/70 132/87 126/71 130/76  Pulse: 92 94 96 92  Resp:  16    Temp: 97.9 F (36.6 C) 97.7 F (36.5 C) 97.7 F (36.5 C) 98 F (36.7 C)  TempSrc: Oral Oral Oral Oral  SpO2: 95% 98% 96%   Weight:   (!) 139.7 kg   Height:   '5\' 7"'$  (1.702 m)     Intake/Output Summary (Last 24 hours) at 05/27/2022 0920 Last data filed at 05/27/2022 0842 Gross per 24 hour  Intake 1800 ml  Output 2000 ml  Net -200 ml      05/27/2022    4:00 AM 05/26/2022    3:25 AM 05/25/2022    6:22 PM  Last 3 Weights  Weight (lbs) 308 lb 305 lb 12.5 oz 311 lb 1.1 oz  Weight (kg) 139.708 kg 138.7 kg 141.1 kg      Telemetry    Atrial fibrillation at 100s - Personally Reviewed  ECG    N/A  Physical Exam   GEN: Obese female in no acute distress.   Neck: JVD difficult to evaluate  Cardiac: IR IR tachycardic, no  murmurs, rubs, or gallops.  Respiratory: Clear to auscultation bilaterally. GI: Soft, nontender, non-distended  MS: No edema; No deformity. Neuro:  Nonfocal  Psych: Normal affect   Labs    High Sensitivity Troponin:   Recent Labs  Lab 05/25/22 1101 05/25/22 1300  TROPONINIHS 8 9     Chemistry Recent Labs  Lab 05/25/22 1101 05/26/22 0401 05/27/22 0642  NA 138 137 135  K 3.8 3.2* 4.1  CL 105 101 102  CO2 '23 24 26  '$ GLUCOSE 248* 263* 196*  BUN '12 16 19  '$ CREATININE 0.87 1.05* 1.16*  CALCIUM 9.6 9.0 8.4*  MG 2.0  --  2.0  PROT 7.3  --   --   ALBUMIN 3.4*  --   --   AST 21  --   --   ALT 10  --   --   ALKPHOS 111  --   --   BILITOT 0.7  --   --   GFRNONAA >60 >60 56*  ANIONGAP '10 12 7     '$ Hematology Recent Labs  Lab 05/25/22 1101  WBC  5.4  RBC 5.19*  HGB 13.9  HCT 43.7  MCV 84.2  MCH 26.8  MCHC 31.8  RDW 17.2*  PLT 206   BNP Recent Labs  Lab 05/25/22 1101  BNP 744.8*     Radiology    ECHOCARDIOGRAM COMPLETE  Result Date: 05/26/2022    ECHOCARDIOGRAM REPORT   Patient Name:   HANIN [name] Date of Exam: 05/26/2022 Medical Rec #:  [id number]     Height:       67.0 in Accession #:    [id number]    Weight:       305.8 lb Date of Birth:  08/01/1966     BSA:          2.422 m Patient Age:    23 years      BP:           136/93 mmHg Patient Gender: F             HR:           93 bpm. Exam Location:  Inpatient Procedure: 2D Echo, Cardiac Doppler and Color Doppler Indications:    CHF-acute diastolic  History:        Patient has prior history of Echocardiogram examinations, most                 recent 04/18/2021. Pulmonary HTN, Arrythmias:Atrial Fibrillation;                 Risk Factors:Hypertension, Diabetes and Sleep Apnea. Cor                 pulmonale. HFpEF.  Sonographer:    Clayton Lefort RDCS (AE) Referring Phys: [id number] [place]  1. Left ventricular ejection fraction, by estimation, is 60 to 65%. The left ventricle has normal function. The left  ventricle has no regional wall motion abnormalities. There is severe left ventricular hypertrophy. Left ventricular diastolic parameters  are indeterminate. Septal thickenness over 58m but difficult to measure.  2. Right ventricular systolic function is normal. The right ventricular size is normal. There is moderately elevated pulmonary artery systolic pressure.  3. Left atrial size was severely dilated.  4. A small pericardial effusion is present.  5. The mitral valve is normal in structure. Trivial mitral valve regurgitation. No evidence of mitral stenosis.  6. The aortic valve is tricuspid. Aortic valve regurgitation is not visualized.  7. The inferior vena cava is dilated in size with <50% respiratory variability, suggesting right atrial pressure of 15 mmHg. Conclusion(s)/Recommendation(s): Consider CMR for the evaluation of severe hypertrophy for evaluation of non obstructive hypertrophic cardiomyopathy. FINDINGS  Left Ventricle: Left ventricular ejection fraction, by estimation, is 60 to 65%. The left ventricle has normal function. The left ventricle has no regional wall motion abnormalities. The left ventricular internal cavity size was normal in size. There is  severe left ventricular hypertrophy. Left ventricular diastolic parameters are indeterminate. Right Ventricle: The right ventricular size is normal. No increase in right ventricular wall thickness. Right ventricular systolic function is normal. There is moderately elevated pulmonary artery systolic pressure. The tricuspid regurgitant velocity is 2.91 m/s, and with an assumed right atrial pressure of 15 mmHg, the estimated right ventricular systolic pressure is 464.3mmHg. Left Atrium: Left atrial size was severely dilated. Right Atrium: Right atrial size was normal in size. Pericardium: A small pericardial effusion is present. Presence of epicardial fat layer. Mitral Valve: The mitral valve is normal in structure. Trivial mitral valve regurgitation.  No evidence of mitral  valve stenosis. Tricuspid Valve: The tricuspid valve is normal in structure. Tricuspid valve regurgitation is not demonstrated. No evidence of tricuspid stenosis. Aortic Valve: The aortic valve is tricuspid. Aortic valve regurgitation is not visualized. Aortic valve mean gradient measures 3.0 mmHg. Aortic valve peak gradient measures 5.1 mmHg. Aortic valve area, by VTI measures 2.85 cm. Pulmonic Valve: The pulmonic valve was normal in structure. Pulmonic valve regurgitation is trivial. No evidence of pulmonic stenosis. Aorta: The aortic root is normal in size and structure. Venous: The inferior vena cava is dilated in size with less than 50% respiratory variability, suggesting right atrial pressure of 15 mmHg. IAS/Shunts: No atrial level shunt detected by color flow Doppler.  LEFT VENTRICLE PLAX 2D LVIDd:         3.90 cm LVIDs:         2.30 cm LV PW:         2.60 cm LV IVS:        1.80 cm LVOT diam:     2.00 cm LV SV:         50 LV SV Index:   20 LVOT Area:     3.14 cm  RIGHT VENTRICLE          IVC RV Basal diam:  2.90 cm  IVC diam: 2.10 cm TAPSE (M-mode): 1.7 cm LEFT ATRIUM              Index        RIGHT ATRIUM           Index LA diam:        4.30 cm  1.78 cm/m   RA Area:     18.10 cm LA Vol (A2C):   116.0 ml 47.89 ml/m  RA Volume:   46.80 ml  19.32 ml/m LA Vol (A4C):   119.0 ml 49.13 ml/m LA Biplane Vol: 126.0 ml 52.02 ml/m  AORTIC VALVE AV Area (Vmax):    2.36 cm AV Area (Vmean):   2.51 cm AV Area (VTI):     2.85 cm AV Vmax:           113.00 cm/s AV Vmean:          78.500 cm/s AV VTI:            0.174 m AV Peak Grad:      5.1 mmHg AV Mean Grad:      3.0 mmHg LVOT Vmax:         85.00 cm/s LVOT Vmean:        62.800 cm/s LVOT VTI:          0.158 m LVOT/AV VTI ratio: 0.91  AORTA Ao Root diam: 3.20 cm Ao Asc diam:  3.60 cm TRICUSPID VALVE TR Peak grad:   33.9 mmHg TR Vmax:        291.00 cm/s  SHUNTS Systemic VTI:  0.16 m Systemic Diam: 2.00 cm Rudean Haskell MD Electronically  signed by Rudean Haskell MD Signature Date/Time: 05/26/2022/11:45:32 AM    Final    DG Chest 2 View  Result Date: 05/25/2022 CLINICAL DATA:  Provided history: Chest pain. Shortness of breath. Patient reports illness for 3 weeks. History of CHF. EXAM: CHEST - 2 VIEW COMPARISON:  Prior chest radiographs 11/10/2021 and earlier. FINDINGS: Cardiomegaly, unchanged. Central pulmonary vascular congestion. Interstitial and ill-defined airspace opacities, predominantly within the right mid and lower lung fields. Trace fissural fluid on the right. No evidence of pleural effusion or pneumothorax. No acute bony abnormality identified. IMPRESSION: Cardiomegaly with  central pulmonary vascular congestion. Interstitial and ill-defined airspace opacities, predominantly within the right mid and lower lung fields, which may reflect asymmetric edema or infection. Trace fissural fluid on the right. Electronically Signed   By: Kellie Simmering D.O.   On: 05/25/2022 11:32    Cardiac Studies   Echo 05/26/22  1. Left ventricular ejection fraction, by estimation, is 60 to 65%. The  left ventricle has normal function. The left ventricle has no regional  wall motion abnormalities. There is severe left ventricular hypertrophy.  Left ventricular diastolic parameters   are indeterminate. Septal thickenness over 60m but difficult to measure.   2. Right ventricular systolic function is normal. The right ventricular  size is normal. There is moderately elevated pulmonary artery systolic  pressure.   3. Left atrial size was severely dilated.   4. A small pericardial effusion is present.   5. The mitral valve is normal in structure. Trivial mitral valve  regurgitation. No evidence of mitral stenosis.   6. The aortic valve is tricuspid. Aortic valve regurgitation is not  visualized.   7. The inferior vena cava is dilated in size with <50% respiratory  variability, suggesting right atrial pressure of 15 mmHg.    Conclusion(s)/Recommendation(s): Consider CMR for the evaluation of severe  hypertrophy for evaluation of non obstructive hypertrophic cardiomyopathy.   Patient Profile     56y.o. female  with hx of HFpEF, persistent atrial fibrillation on Xarelto, hypertension, obesity, OSA not compliant with CPAP who is being seen 05/25/2022 for the evaluation of CHF.  [place]  Reports she ran out of all her medication several weeks ago and has been experiencing increasing shortness of breath, nonproductive cough and lower extremity edema.    She presented to the internal medicine clinic with symptoms and was noted to be hypoxic, was referred to the ED for further evaluation.  Assessment & Plan    Acute on chronic diastolic CHF - treated with IV lasix, now on PO. Breathing has improved.  - Net I & O - 2.9L - Weight down 311>>308lb - Continue Laisx '40mg'$  BID, spiro '25mg'$  qd, Losartan '25mg'$  qd, hydralazine '25mg'$  TID and coreg '25mg'$  BID   2. HTN - Elevated initially on arrival but now normalized  - Continue current therapy   3. Persistent atrial fib - Rate was stable however elevated this morning in 100s - Continue Coreg '25mg'$  BID - Xarelto has been resumed  4. AKI - Follow closely   5. LVH - Consider outpatient cMRI    For questions or updates, please contact CC[place]tonPlease consult [email or web address] for contact info under        Signed, BLeanor Kail PA  05/27/2022, 9:20 AM     Attending Note:   The patient was seen and examined.  Agree with assessment and plan as noted above.  Changes made to the above note as needed.  Patient seen and independently examined with VRobbie Lis PA .   We discussed all aspects of the encounter. I agree with the assessment and plan as stated above.    Acute on chronic diastolic CHF:  continues to improved.   Still too weak to walk much .  Wants to stay another day .    Encouraged her to ambulate.  2. HTN>  cont current meds  3.  Atrail fib:  Hr is controlled    I have spent a total of  40 minutes with patient reviewing hospital  notes , telemetry, EKGs, labs and examining patient as well as establishing an assessment and plan that was discussed with the patient.  > 50% of time was spent in direct patient care.    Thayer Headings, Brooke Bonito., MD, [facility] 05/27/2022, 10:38 AM [address]  [place] Pager [phone]

## 2022-05-27 NOTE — Progress Notes (Signed)
PROGRESS NOTE    [name]  VQM:[id number] DOB: 06-20-1966 DOA: 05/25/2022 PCP: Vanessa Pounds, NP  56/F with history of hypertension, obesity, diastolic CHF, pulmonary hypertension, atrial fibrillation presented to the ED with dyspnea, [place] and ran out of meds 1 week prior to admission, chest x-ray noted cardiomegaly, pulmonary vascular congestion, hospital course with hyperglycemia  Subjective: Feels well, breathing close to baseline  Assessment and Plan:  Acute on chronic diastolic CHF  -echo noted EF 60 to 65%, severe LVH, RV systolic function preserved. Severe dilatation of left atrium -Diuresed well with IV Lasix she is 3.4 L negative  -Transition to oral Lasix today, continue Aldactone losartan, Coreg and hydralazine -reportedly intolerant to SGLT2i -Discharge planning  Essential hypertension -Now stable, meds as above  Non-insulin dependent type 2 diabetes mellitus (HCC) -Uncontrolled, HbA1c is 10.5 -Intolerant to SGLT2i, continue glipizide, resume metformin at discharge, recommended importance of weight loss, close follow-up with PCP  Persistent atrial fibrillation (HCC) -Continue Coreg and Xarelto  Hypokalemia -Replaced  Bipolar 1 disorder, mixed, moderate (HCC) Continue with risperidone and escitalopram.   Class 3 severe obesity due to excess calories with serious comorbidity and body mass index (BMI) of 60.0 to 69.9 in adult (HCC) Calculated BMI is 47  -Weight loss emphasized, dietitian consult  DVT prophylaxis: Xarelto Code Status: Full code Family Communication: Discussed with patient in detail, no family at bedside Disposition Plan: Home later today or in a.m.  Consultants: Cardiology   Procedures:   Antimicrobials:    Objective: Vitals:   05/26/22 2000 05/26/22 2300 05/27/22 0400 05/27/22 0822  BP: 116/70 132/87 126/71 130/76  Pulse: 92 94 96 92  Resp:  16    Temp: 97.9 F (36.6 C) 97.7 F (36.5 C) 97.7 F (36.5 C) 98  F (36.7 C)  TempSrc: Oral Oral Oral Oral  SpO2: 95% 98% 96%   Weight:   (!) 139.7 kg   Height:   '5\' 7"'$  (1.702 m)     Intake/Output Summary (Last 24 hours) at 05/27/2022 1204 Last data filed at 05/27/2022 0842 Gross per 24 hour  Intake 1560 ml  Output 2000 ml  Net -440 ml   Filed Weights   05/25/22 1822 05/26/22 0325 05/27/22 0400  Weight: (!) 141.1 kg (!) 138.7 kg (!) 139.7 kg    Examination:  General exam: Pleasant female, AAOx3, no distress Respiratory system: Clear to auscultation Cardiovascular system: S1 & S2 heard, RRR.  Abd: nondistended, soft and nontender.Normal bowel sounds heard. Central nervous system: Alert and oriented. No focal neurological deficits. Extremities: no edema Skin: No rashes Psychiatry:  Mood & affect appropriate.     Data Reviewed:   CBC: Recent Labs  Lab 05/25/22 1101  WBC 5.4  NEUTROABS 3.7  HGB 13.9  HCT 43.7  MCV 84.2  PLT 932   Basic Metabolic Panel: Recent Labs  Lab 05/25/22 1101 05/26/22 0401 05/27/22 0642  NA 138 137 135  K 3.8 3.2* 4.1  CL 105 101 102  CO2 '23 24 26  '$ GLUCOSE 248* 263* 196*  BUN '12 16 19  '$ CREATININE 0.87 1.05* 1.16*  CALCIUM 9.6 9.0 8.4*  MG 2.0  --  2.0   GFR: Estimated Creatinine Clearance: 81.2 mL/min (A) (by C-G formula based on SCr of 1.16 mg/dL (H)). Liver Function Tests: Recent Labs  Lab 05/25/22 1101  AST 21  ALT 10  ALKPHOS 111  BILITOT 0.7  PROT 7.3  ALBUMIN 3.4*   No results for input(s): "LIPASE", "AMYLASE" in  the last 168 hours. No results for input(s): "AMMONIA" in the last 168 hours. Coagulation Profile: No results for input(s): "INR", "PROTIME" in the last 168 hours. Cardiac Enzymes: No results for input(s): "CKTOTAL", "CKMB", "CKMBINDEX", "TROPONINI" in the last 168 hours. BNP (last 3 results) No results for input(s): "PROBNP" in the last 8760 hours. HbA1C: Recent Labs    05/25/22 1837  HGBA1C 10.5*   CBG: Recent Labs  Lab 05/26/22 1653 05/26/22 2145  05/27/22 0650 05/27/22 0818 05/27/22 1112  GLUCAP 200* 316* 186* 200* 268*   Lipid Profile: No results for input(s): "CHOL", "HDL", "LDLCALC", "TRIG", "CHOLHDL", "LDLDIRECT" in the last 72 hours. Thyroid Function Tests: No results for input(s): "TSH", "T4TOTAL", "FREET4", "T3FREE", "THYROIDAB" in the last 72 hours. Anemia Panel: No results for input(s): "VITAMINB12", "FOLATE", "FERRITIN", "TIBC", "IRON", "RETICCTPCT" in the last 72 hours. Urine analysis:    Component Value Date/Time   COLORURINE YELLOW 08/06/2018 0753   APPEARANCEUR CLEAR 08/06/2018 0753   LABSPEC 1.018 08/06/2018 0753   PHURINE 5.0 08/06/2018 0753   GLUCOSEU 50 (A) 08/06/2018 0753   HGBUR MODERATE (A) 08/06/2018 0753   BILIRUBINUR small (A) 07/02/2020 1116   BILIRUBINUR neg 09/10/2014 1219   KETONESUR trace (5) (A) 07/02/2020 1116   KETONESUR NEGATIVE 08/06/2018 0753   PROTEINUR 100 (A) 08/06/2018 0753   UROBILINOGEN 1.0 07/02/2020 1116   UROBILINOGEN 0.2 12/07/2013 0041   NITRITE Negative 07/02/2020 1116   NITRITE NEGATIVE 08/06/2018 0753   LEUKOCYTESUR Small (1+) (A) 07/02/2020 1116   Sepsis Labs: '@LABRCNTIP'$ (procalcitonin:4,lacticidven:4)  )No results found for this or any previous visit (from the past 240 hour(s)).   Radiology Studies: ECHOCARDIOGRAM COMPLETE  Result Date: 05/26/2022    ECHOCARDIOGRAM REPORT   Patient Name:   [name] Date of Exam: 05/26/2022 Medical Rec #:  [id number]     Height:       67.0 in Accession #:    [id number]    Weight:       305.8 lb Date of Birth:  08-Aug-1966     BSA:          2.422 m Patient Age:    42 years      BP:           136/93 mmHg Patient Gender: F             HR:           93 bpm. Exam Location:  Inpatient Procedure: 2D Echo, Cardiac Doppler and Color Doppler Indications:    CHF-acute diastolic  History:        Patient has prior history of Echocardiogram examinations, most                 recent 04/18/2021. Pulmonary HTN, Arrythmias:Atrial Fibrillation;                  Risk Factors:Hypertension, Diabetes and Sleep Apnea. Cor                 pulmonale. HFpEF.  Sonographer:    [name] RDCS (AE) Referring Phys: [id number] [name]  1. Left ventricular ejection fraction, by estimation, is 60 to 65%. The left ventricle has normal function. The left ventricle has no regional wall motion abnormalities. There is severe left ventricular hypertrophy. Left ventricular diastolic parameters  are indeterminate. Septal thickenness over 40m but difficult to measure.  2. Right ventricular systolic function is normal. The right ventricular size is normal. There is moderately elevated pulmonary artery systolic pressure.  3.  Left atrial size was severely dilated.  4. A small pericardial effusion is present.  5. The mitral valve is normal in structure. Trivial mitral valve regurgitation. No evidence of mitral stenosis.  6. The aortic valve is tricuspid. Aortic valve regurgitation is not visualized.  7. The inferior vena cava is dilated in size with <50% respiratory variability, suggesting right atrial pressure of 15 mmHg. Conclusion(s)/Recommendation(s): Consider CMR for the evaluation of severe hypertrophy for evaluation of non obstructive hypertrophic cardiomyopathy. FINDINGS  Left Ventricle: Left ventricular ejection fraction, by estimation, is 60 to 65%. The left ventricle has normal function. The left ventricle has no regional wall motion abnormalities. The left ventricular internal cavity size was normal in size. There is  severe left ventricular hypertrophy. Left ventricular diastolic parameters are indeterminate. Right Ventricle: The right ventricular size is normal. No increase in right ventricular wall thickness. Right ventricular systolic function is normal. There is moderately elevated pulmonary artery systolic pressure. The tricuspid regurgitant velocity is 2.91 m/s, and with an assumed right atrial pressure of 15 mmHg, the estimated right ventricular systolic  pressure is 32.3 mmHg. Left Atrium: Left atrial size was severely dilated. Right Atrium: Right atrial size was normal in size. Pericardium: A small pericardial effusion is present. Presence of epicardial fat layer. Mitral Valve: The mitral valve is normal in structure. Trivial mitral valve regurgitation. No evidence of mitral valve stenosis. Tricuspid Valve: The tricuspid valve is normal in structure. Tricuspid valve regurgitation is not demonstrated. No evidence of tricuspid stenosis. Aortic Valve: The aortic valve is tricuspid. Aortic valve regurgitation is not visualized. Aortic valve mean gradient measures 3.0 mmHg. Aortic valve peak gradient measures 5.1 mmHg. Aortic valve area, by VTI measures 2.85 cm. Pulmonic Valve: The pulmonic valve was normal in structure. Pulmonic valve regurgitation is trivial. No evidence of pulmonic stenosis. Aorta: The aortic root is normal in size and structure. Venous: The inferior vena cava is dilated in size with less than 50% respiratory variability, suggesting right atrial pressure of 15 mmHg. IAS/Shunts: No atrial level shunt detected by color flow Doppler.  LEFT VENTRICLE PLAX 2D LVIDd:         3.90 cm LVIDs:         2.30 cm LV PW:         2.60 cm LV IVS:        1.80 cm LVOT diam:     2.00 cm LV SV:         50 LV SV Index:   20 LVOT Area:     3.14 cm  RIGHT VENTRICLE          IVC RV Basal diam:  2.90 cm  IVC diam: 2.10 cm TAPSE (M-mode): 1.7 cm LEFT ATRIUM              Index        RIGHT ATRIUM           Index LA diam:        4.30 cm  1.78 cm/m   RA Area:     18.10 cm LA Vol (A2C):   116.0 ml 47.89 ml/m  RA Volume:   46.80 ml  19.32 ml/m LA Vol (A4C):   119.0 ml 49.13 ml/m LA Biplane Vol: 126.0 ml 52.02 ml/m  AORTIC VALVE AV Area (Vmax):    2.36 cm AV Area (Vmean):   2.51 cm AV Area (VTI):     2.85 cm AV Vmax:           113.00  cm/s AV Vmean:          78.500 cm/s AV VTI:            0.174 m AV Peak Grad:      5.1 mmHg AV Mean Grad:      3.0 mmHg LVOT Vmax:          85.00 cm/s LVOT Vmean:        62.800 cm/s LVOT VTI:          0.158 m LVOT/AV VTI ratio: 0.91  AORTA Ao Root diam: 3.20 cm Ao Asc diam:  3.60 cm TRICUSPID VALVE TR Peak grad:   33.9 mmHg TR Vmax:        291.00 cm/s  SHUNTS Systemic VTI:  0.16 m Systemic Diam: 2.00 cm Rudean Haskell MD Electronically signed by Rudean Haskell MD Signature Date/Time: 05/26/2022/11:45:32 AM    Final      Scheduled Meds:  amLODipine  10 mg Oral Daily   carvedilol  25 mg Oral BID WC   escitalopram  20 mg Oral q morning   furosemide  40 mg Oral BID   glipiZIDE  10 mg Oral Q breakfast   hydrALAZINE  25 mg Oral Q8H   insulin aspart  0-15 Units Subcutaneous TID WC   losartan  25 mg Oral Daily   pneumococcal 20-valent conjugate vaccine  0.5 mL Intramuscular Tomorrow-1000   risperidone  2 mg Oral Daily   rivaroxaban  20 mg Oral Q supper   sodium chloride flush  3 mL Intravenous Q12H   spironolactone  25 mg Oral Daily   Continuous Infusions:  sodium chloride       LOS: 1 day    Time spent: 52mn  PDomenic Polite MD [facility]   05/27/2022, 12:04 PM

## 2022-05-27 NOTE — Inpatient Diabetes Management (Signed)
Inpatient Diabetes Program Recommendations  AACE/ADA: New Consensus Statement on Inpatient Glycemic Control (2015)  Target Ranges:  Prepandial:   less than 140 mg/dL      Peak postprandial:   less than 180 mg/dL (1-2 hours)      Critically ill patients:  140 - 180 mg/dL   Lab Results  Component Value Date   GLUCAP 200 (H) 05/27/2022   HGBA1C 10.5 (H) 05/25/2022    Review of Glycemic Control  Latest Reference Range & Units 05/26/22 06:05 05/26/22 11:37 05/26/22 16:53 05/26/22 21:45 05/27/22 06:50 05/27/22 08:18  Glucose-Capillary 70 - 99 mg/dL 235 (H) 387 (H) 200 (H) 316 (H) 186 (H) 200 (H)   Diabetes history: DM 2 Outpatient Diabetes medications: Glipizide 10 mg Daily, Metformin 1000 mg bid Current orders for Inpatient glycemic control:  Glipizide 10 mg Daily Novolog 0-15 units tid  A1c 10.5% on 8/28  -  Increase Glipizide to 10 mg bid   Spoke with pt yesterday 8/29.  Pt reports not wanting to do insulin at this time.   D/c needs: Blood glucose meter kit order # [id number] Trulicity 4.96 mg weekly #[id number] Insulin pen needles order # [id number]  Thanks,  Tama Headings RN, MSN, BC-ADM Inpatient Diabetes Coordinator Team Pager [phone] (8a-5p)

## 2022-05-27 NOTE — Progress Notes (Signed)
A/O x4. Compliant with care and medication. CBG 316 @ HS and > 200 for 5 prior occurrences. Per orders, RN to report CBG to MD if > 200 for more than 20 occurrences. Pt has had 3 CBGs >300, remainder have been low-mid 200s. Denies hyperglycemic symptoms.

## 2022-05-28 ENCOUNTER — Other Ambulatory Visit (HOSPITAL_COMMUNITY): Payer: Self-pay

## 2022-05-28 LAB — BASIC METABOLIC PANEL
Anion gap: 8 (ref 5–15)
BUN: 20 mg/dL (ref 6–20)
CO2: 28 mmol/L (ref 22–32)
Calcium: 8.5 mg/dL — ABNORMAL LOW (ref 8.9–10.3)
Chloride: 99 mmol/L (ref 98–111)
Creatinine, Ser: 1.27 mg/dL — ABNORMAL HIGH (ref 0.44–1.00)
GFR, Estimated: 50 mL/min — ABNORMAL LOW (ref >60)
Glucose, Bld: 249 mg/dL — ABNORMAL HIGH (ref 70–99)
Potassium: 4.3 mmol/L (ref 3.5–5.1)
Sodium: 135 mmol/L (ref 135–145)

## 2022-05-28 LAB — GLUCOSE, CAPILLARY
Glucose-Capillary: 302 mg/dL — ABNORMAL HIGH (ref 70–99)
Glucose-Capillary: 197 mg/dL — ABNORMAL HIGH (ref 70–99)
Glucose-Capillary: 183 mg/dL — ABNORMAL HIGH (ref 70–99)

## 2022-05-28 MED ORDER — INSULIN ASPART 100 UNIT/ML IJ SOLN
5.0000 [IU] | Freq: Three times a day (TID) | INTRAMUSCULAR | Status: DC
  Administered 2022-05-28 (×3): 5 [IU] via SUBCUTANEOUS

## 2022-05-28 MED ORDER — LOSARTAN POTASSIUM 25 MG PO TABS
25.0000 mg | ORAL_TABLET | Freq: Every day | ORAL | 0 refills | Status: DC
  Filled 2022-05-28: qty 30, 30d supply, fill #0

## 2022-05-28 MED ORDER — ACCU-CHEK GUIDE VI STRP
ORAL_STRIP | 0 refills | Status: DC
Start: 2022-05-28 — End: 2022-10-23
  Filled 2022-05-28: qty 100, 25d supply, fill #0

## 2022-05-28 MED ORDER — ACCU-CHEK SOFTCLIX LANCETS MISC
0 refills | Status: DC
  Filled 2022-05-28: qty 100, 25d supply, fill #0

## 2022-05-28 MED ORDER — TRULICITY 1.5 MG/0.5ML ~~LOC~~ SOAJ
1.5000 mg | SUBCUTANEOUS | 0 refills | Status: DC
  Filled 2022-05-28: qty 6, 84d supply, fill #0

## 2022-05-28 MED ORDER — RIVAROXABAN 20 MG PO TABS
20.0000 mg | ORAL_TABLET | Freq: Every day | ORAL | 0 refills | Status: DC
  Filled 2022-05-28: qty 30, 30d supply, fill #0

## 2022-05-28 MED ORDER — CARVEDILOL 25 MG PO TABS
25.0000 mg | ORAL_TABLET | Freq: Two times a day (BID) | ORAL | 0 refills | Status: DC
  Filled 2022-05-28: qty 60, 30d supply, fill #0

## 2022-05-28 MED ORDER — SPIRONOLACTONE 25 MG PO TABS
25.0000 mg | ORAL_TABLET | Freq: Every day | ORAL | 0 refills | Status: DC
  Filled 2022-05-28: qty 30, 30d supply, fill #0

## 2022-05-28 MED ORDER — AMLODIPINE BESYLATE 10 MG PO TABS
10.0000 mg | ORAL_TABLET | Freq: Every day | ORAL | 0 refills | Status: DC
  Filled 2022-05-28: qty 30, 30d supply, fill #0

## 2022-05-28 MED ORDER — RISPERIDONE 2 MG PO TABS
2.0000 mg | ORAL_TABLET | Freq: Every day | ORAL | 0 refills | Status: DC
  Filled 2022-05-28: qty 30, 30d supply, fill #0

## 2022-05-28 MED ORDER — TORSEMIDE 20 MG PO TABS
40.0000 mg | ORAL_TABLET | Freq: Two times a day (BID) | ORAL | 0 refills | Status: DC
  Filled 2022-05-28: qty 120, 30d supply, fill #0

## 2022-05-28 MED ORDER — GLIPIZIDE ER 10 MG PO TB24
10.0000 mg | ORAL_TABLET | Freq: Two times a day (BID) | ORAL | Status: DC
  Administered 2022-05-28: 10 mg via ORAL
  Filled 2022-05-28 (×2): qty 1

## 2022-05-28 MED ORDER — ESCITALOPRAM OXALATE 20 MG PO TABS
20.0000 mg | ORAL_TABLET | Freq: Every morning | ORAL | 0 refills | Status: DC
  Filled 2022-05-28: qty 30, 30d supply, fill #0

## 2022-05-28 MED ORDER — GLIPIZIDE ER 10 MG PO TB24
10.0000 mg | ORAL_TABLET | Freq: Two times a day (BID) | ORAL | 0 refills | Status: DC
  Filled 2022-05-28: qty 60, 30d supply, fill #0

## 2022-05-28 MED ORDER — ACCU-CHEK GUIDE W/DEVICE KIT
PACK | 0 refills | Status: DC
  Filled 2022-05-28: qty 1, 30d supply, fill #0

## 2022-05-28 MED ORDER — METFORMIN HCL 1000 MG PO TABS
1000.0000 mg | ORAL_TABLET | Freq: Two times a day (BID) | ORAL | 0 refills | Status: DC
  Filled 2022-05-28: qty 60, 30d supply, fill #0

## 2022-05-28 MED ORDER — HYDRALAZINE HCL 25 MG PO TABS
25.0000 mg | ORAL_TABLET | Freq: Three times a day (TID) | ORAL | 0 refills | Status: DC
  Filled 2022-05-28: qty 90, 30d supply, fill #0

## 2022-05-28 NOTE — Evaluation (Signed)
Physical Therapy Evaluation and discharge Patient Details Name: [name] MRN: [id number] DOB: [birth year] Today's Date: 05/28/2022  History of Present Illness  Pt is a 56 y/o female admitted secondary to worsening SOB, likely from CHF exacerbation. PMH includes DM, bipolar disorder, HTN, a fib, and obesity.  Clinical Impression  Patient evaluated by Physical Therapy with no further acute PT needs identified. All education has been completed and the patient has no further questions. Pt requiring some encouragement to participate and only wanting to go short distance. Overall mod I for mobility tasks and did not require physical assist. Pt reports breathing feels much improved as well. Has assist from family at home if needed. See below for any follow-up Physical Therapy or equipment needs. PT is signing off. Thank you for this referral. If needs change, please re-consult.         Recommendations for follow up therapy are one component of a multi-disciplinary discharge planning process, led by the attending physician.  Recommendations may be updated based on patient status, additional functional criteria and insurance authorization.  Follow Up Recommendations No PT follow up      Assistance Recommended at Discharge Intermittent Supervision/Assistance  Patient can return home with the following  Assistance with cooking/housework    Equipment Recommendations None recommended by PT  Recommendations for Other Services       Functional Status Assessment Patient has not had a recent decline in their functional status     Precautions / Restrictions Precautions Precautions: None Restrictions Weight Bearing Restrictions: No      Mobility  Bed Mobility               General bed mobility comments: Sitting EOB upon entry    Transfers Overall transfer level: Modified independent                 General transfer comment: Used momentum to stand     Ambulation/Gait Ambulation/Gait assistance: Modified independent (Device/Increase time) Gait Distance (Feet): 50 Feet Assistive device: None Gait Pattern/deviations: Step-through pattern Gait velocity: Decreased     General Gait Details: Pt only wanting to ambulate short distance. Overall mod I with no physical assist required.  Stairs            Wheelchair Mobility    Modified Rankin (Stroke Patients Only)       Balance Overall balance assessment: Mild deficits observed, not formally tested                                           Pertinent Vitals/Pain Pain Assessment Pain Assessment: No/denies pain    Home Living Family/patient expects to be discharged to:: Private residence Living Arrangements: Parent Available Help at Discharge: Family Type of Home: Apartment Home Access: Stairs to enter Entrance Stairs-Rails: Right Entrance Stairs-Number of Steps: flight   Home Layout: One level Home Equipment: None      Prior Function Prior Level of Function : Independent/Modified Independent                     Hand Dominance        Extremity/Trunk Assessment   Upper Extremity Assessment Upper Extremity Assessment: Overall WFL for tasks assessed    Lower Extremity Assessment Lower Extremity Assessment: Generalized weakness    Cervical / Trunk Assessment Cervical / Trunk Assessment: Other exceptions Cervical / Trunk Exceptions: increased body habitus  Communication   Communication: No difficulties  Cognition Arousal/Alertness: Awake/alert Behavior During Therapy: Flat affect Overall Cognitive Status: No family/caregiver present to determine baseline cognitive functioning                                 General Comments: Limited participation initially        General Comments      Exercises     Assessment/Plan    PT Assessment Patient does not need any further PT services  PT Problem List          PT Treatment Interventions      PT Goals (Current goals can be found in the Care Plan section)  Acute Rehab PT Goals PT Goal Formulation: All assessment and education complete, DC therapy Time For Goal Achievement: 05/28/22 Potential to Achieve Goals: Good    Frequency       Co-evaluation               AM-PAC PT "6 Clicks" Mobility  Outcome Measure Help needed turning from your back to your side while in a flat bed without using bedrails?: None Help needed moving from lying on your back to sitting on the side of a flat bed without using bedrails?: None Help needed moving to and from a bed to a chair (including a wheelchair)?: None Help needed standing up from a chair using your arms (e.g., wheelchair or bedside chair)?: None Help needed to walk in hospital room?: None Help needed climbing 3-5 steps with a railing? : A Little 6 Click Score: 23    End of Session   Activity Tolerance: Patient tolerated treatment well Patient left: in bed;with call bell/phone within reach (sitting EOB) Nurse Communication: Mobility status PT Visit Diagnosis: Other abnormalities of gait and mobility (R26.89)    Time: 2703-5009 PT Time Calculation (min) (ACUTE ONLY): 10 min   Charges:   PT Evaluation $PT Eval Low Complexity: 1 Low          Lou Miner, DPT  Acute Rehabilitation Services  Office: [phone]   Rudean Hitt 05/28/2022, 12:34 PM

## 2022-05-28 NOTE — Discharge Summary (Signed)
Physician Discharge Summary  Aaralynn Shepheard YYT:[id number] DOB: 03/23/1966 DOA: 05/25/2022  PCP: [name], NP  Admit date: 05/25/2022 Discharge date: 05/28/2022  Time spent: 35  minutes  Recommendations for Outpatient Follow-up:  PCP in 1 week, please arrange outpatient sleep study, CBGs uncontrolled, may need to start insulin or GLP-1 agonist, BMP in 1 week CHF TOC clinic on 9/6 Cardiology Dr. [name] in 1 to 2 months   Discharge Diagnoses:  Principal Problem:   Acute diastolic CHF (congestive heart failure) ([place]) Active Problems:   Essential hypertension   Non-insulin dependent type 2 diabetes mellitus (HCC)   Persistent atrial fibrillation (HCC)   Hypokalemia   Bipolar 1 disorder, mixed, moderate (HCC)   Class 3 severe obesity due to excess calories with serious comorbidity and body mass index (BMI) of 60.0 to 69.9 in adult [facility])   Discharge Condition: Improved  Diet recommendation: Low-sodium, diabetic  Filed Weights   05/26/22 0325 05/27/22 0400 05/28/22 0500  Weight: (!) 138.7 kg (!) 139.7 kg (!) 140.1 kg    History of present illness:  56/F with history of hypertension, obesity, diastolic CHF, pulmonary hypertension, atrial fibrillation presented to the ED with dyspnea, [place] and ran out of meds 1 week prior to admission, chest x-ray noted cardiomegaly, pulmonary vascular congestion, hospital course with hyperglycemia  Hospital Course:   Acute on chronic diastolic CHF  -echo noted EF 60 to 65%, severe LVH, RV systolic function preserved. Severe dilatation of left atrium -Diuresed well with IV Lasix she is 4.8L negative, now euvolemic -Transitioned back to torsemide, Aldactone losartan, Coreg and hydralazine -reportedly intolerant to SGLT2i -Discussed with cardiology today, recommended torsemide 40 Mg twice daily, meds sent to [facility], CHF TOC clinic follow-up arranged in 1 week -Follow-up with Dr. [name]   Essential hypertension -Now stable,  meds as above   Non-insulin dependent type 2 diabetes mellitus ([place]) -Uncontrolled, HbA1c is 10.5 -Intolerant to SGLT2i, continue glipizide-dose increased to 10 Mg twice daily, resume metformin at discharge, recommended importance of weight loss, discussed insulin, patient was extremely reluctant to start this, resumed Trulicity  -Advised close follow-up with PCP, consider GLP-1 agonist and or insulin at follow-up    Persistent atrial fibrillation ([place]) -Continue Coreg and Xarelto   Hypokalemia -Replaced   Bipolar 1 disorder, mixed, moderate (HCC) Continue with risperidone and escitalopram.    Class 3 severe obesity due to excess calories with serious comorbidity and body mass index (BMI) of 60.0 to 69.9 in adult ([place]) Calculated BMI is 47  -Weight loss emphasized, dietitian consulted  Consultants Cardiology    Discharge Exam: Vitals:   05/28/22 0744 05/28/22 1126  BP: 134/62 131/88  Pulse: 83 90  Resp: 20 18  Temp: 98 F (36.7 C) 98 F (36.7 C)  SpO2: 92% 91%    General exam: Pleasant female, AAOx3, no distress Respiratory system: Clear to auscultation Cardiovascular system: S1 & S2 heard, RRR.  Abd: nondistended, soft and nontender.Normal bowel sounds heard. Central nervous system: Alert and oriented. No focal neurological deficits. Extremities: no edema Skin: No rashes Psychiatry:  Mood & affect appropriate.   Discharge Instructions   Discharge Instructions     Diet - low sodium heart healthy   Complete by: As directed    Diet Carb Modified   Complete by: As directed    Increase activity slowly   Complete by: As directed       Allergies as of 05/28/2022       Reactions   Acetaminophen Other (  See Comments)   Seizure-like "fits" as a child   Caffeine    Tense, anxiety, increased urination   [place] [dapagliflozin] Other (See Comments)   Hallucinations, drop in blood sugar   Lisinopril Rash   Rash with lisinopril; but fosinopril is ok per patient         Medication List     STOP taking these medications    hydrOXYzine 25 MG tablet Commonly known as: ATARAX       TAKE these medications    Accu-Chek Guide test strip Generic drug: glucose blood Use as directed up to 4 times daily   Accu-Chek Guide w/Device Kit Use to check blood sugar three times daily.   Accu-Chek Softclix Lancets lancets Use up to 4 times daily   amLODipine 10 MG tablet Commonly known as: NORVASC Take 1 tablet (10 mg total) by mouth daily. What changed: additional instructions   Blood Pressure Monitor Devi Please provide patient with insurance approved blood pressure monitor I10.0   carvedilol 25 MG tablet Commonly known as: COREG Take 1 tablet (25 mg total) by mouth 2 (two) times daily with a meal. What changed: additional instructions   escitalopram 20 MG tablet Commonly known as: LEXAPRO Take 1 tablet (20 mg total) by mouth every morning. What changed: additional instructions   EYE ALLERGY RELIEF OP Place 1 drop into both eyes daily as needed (allergy).   glipiZIDE 10 MG 24 hr tablet Commonly known as: GLUCOTROL XL Take 1 tablet (10 mg total) by mouth 2 (two) times daily. What changed:  when to take this additional instructions   hydrALAZINE 25 MG tablet Commonly known as: APRESOLINE Take 1 tablet (25 mg total) by mouth every 8 (eight) hours.   Incontinence Brief Large Misc Please provide patient with insurance approved incontinence supplies/briefs   losartan 25 MG tablet Commonly known as: COZAAR Take 1 tablet (25 mg total) by mouth daily. Please fill as a 90 day supply   metFORMIN 1000 MG tablet Commonly known as: GLUCOPHAGE Take 1 tablet (1,000 mg total) by mouth 2 (two) times daily with a meal. What changed: additional instructions   risperiDONE 2 MG tablet Commonly known as: RISPERDAL Take 1 tablet (2 mg total) by mouth at bedtime. What changed:  medication strength how much to take when to take  this additional instructions   rivaroxaban 20 MG Tabs tablet Commonly known as: Xarelto Take 1 tablet (20 mg total) by mouth daily with supper. What changed: additional instructions   spironolactone 25 MG tablet Commonly known as: ALDACTONE Take 1 tablet (25 mg total) by mouth daily.   torsemide 20 MG tablet Commonly known as: DEMADEX Take 2 tablets (40 mg total) by mouth 2 (two) times daily. What changed: how much to take   Trulicity 1.5 QZ/3.0QT Sopn Generic drug: Dulaglutide Inject 1.5 mg into the skin once a week.       Allergies  Allergen Reactions   Acetaminophen Other (See Comments)    Seizure-like "fits" as a child   Caffeine     Tense, anxiety, increased urination   [place] [Dapagliflozin] Other (See Comments)    Hallucinations, drop in blood sugar   Lisinopril Rash    Rash with lisinopril; but fosinopril is ok per patient    Follow-up Information     [name], NP Follow up on 06/22/2022.   Specialty: Nurse Practitioner Why: _0 :[facility] [place] information: [address] [place] [address] [phone]         [place],  Denice Bors, MD .   Specialty: Cardiology Contact information: [address] [place] [address] [phone]                  The results of significant diagnostics from this hospitalization (including imaging, microbiology, ancillary and laboratory) are listed below for reference.    Significant Diagnostic Studies: ECHOCARDIOGRAM COMPLETE  Result Date: 05/26/2022    ECHOCARDIOGRAM REPORT   Patient Name:   [name] Date of Exam: 05/26/2022 Medical Rec #:  [id number]     Height:       67.0 in Accession #:    [id number]    Weight:       305.8 lb Date of Birth:  Feb 18, 1966     BSA:          2.422 m Patient Age:    56 years      BP:           136/93 mmHg Patient Gender: F             HR:           93 bpm. Exam Location:  Inpatient Procedure: 2D Echo, Cardiac Doppler and Color Doppler Indications:     CHF-acute diastolic  History:        Patient has prior history of Echocardiogram examinations, most                 recent 04/18/2021. Pulmonary HTN, Arrythmias:Atrial Fibrillation;                 Risk Factors:Hypertension, Diabetes and Sleep Apnea. Cor                 pulmonale. HFpEF.  Sonographer:    Clayton Lefort RDCS (AE) Referring Phys: [id number] [place]  1. Left ventricular ejection fraction, by estimation, is 60 to 65%. The left ventricle has normal function. The left ventricle has no regional wall motion abnormalities. There is severe left ventricular hypertrophy. Left ventricular diastolic parameters  are indeterminate. Septal thickenness over 49m but difficult to measure.  2. Right ventricular systolic function is normal. The right ventricular size is normal. There is moderately elevated pulmonary artery systolic pressure.  3. Left atrial size was severely dilated.  4. A small pericardial effusion is present.  5. The mitral valve is normal in structure. Trivial mitral valve regurgitation. No evidence of mitral stenosis.  6. The aortic valve is tricuspid. Aortic valve regurgitation is not visualized.  7. The inferior vena cava is dilated in size with <50% respiratory variability, suggesting right atrial pressure of 15 mmHg. Conclusion(s)/Recommendation(s): Consider CMR for the evaluation of severe hypertrophy for evaluation of non obstructive hypertrophic cardiomyopathy. FINDINGS  Left Ventricle: Left ventricular ejection fraction, by estimation, is 60 to 65%. The left ventricle has normal function. The left ventricle has no regional wall motion abnormalities. The left ventricular internal cavity size was normal in size. There is  severe left ventricular hypertrophy. Left ventricular diastolic parameters are indeterminate. Right Ventricle: The right ventricular size is normal. No increase in right ventricular wall thickness. Right ventricular systolic function is normal. There is  moderately elevated pulmonary artery systolic pressure. The tricuspid regurgitant velocity is 2.91 m/s, and with an assumed right atrial pressure of 15 mmHg, the estimated right ventricular systolic pressure is 423.5mmHg. Left Atrium: Left atrial size was severely dilated. Right Atrium: Right atrial size was normal in size. Pericardium: A small pericardial effusion is present. Presence of epicardial fat layer. Mitral  Valve: The mitral valve is normal in structure. Trivial mitral valve regurgitation. No evidence of mitral valve stenosis. Tricuspid Valve: The tricuspid valve is normal in structure. Tricuspid valve regurgitation is not demonstrated. No evidence of tricuspid stenosis. Aortic Valve: The aortic valve is tricuspid. Aortic valve regurgitation is not visualized. Aortic valve mean gradient measures 3.0 mmHg. Aortic valve peak gradient measures 5.1 mmHg. Aortic valve area, by VTI measures 2.85 cm. Pulmonic Valve: The pulmonic valve was normal in structure. Pulmonic valve regurgitation is trivial. No evidence of pulmonic stenosis. Aorta: The aortic root is normal in size and structure. Venous: The inferior vena cava is dilated in size with less than 50% respiratory variability, suggesting right atrial pressure of 15 mmHg. IAS/Shunts: No atrial level shunt detected by color flow Doppler.  LEFT VENTRICLE PLAX 2D LVIDd:         3.90 cm LVIDs:         2.30 cm LV PW:         2.60 cm LV IVS:        1.80 cm LVOT diam:     2.00 cm LV SV:         50 LV SV Index:   20 LVOT Area:     3.14 cm  RIGHT VENTRICLE          IVC RV Basal diam:  2.90 cm  IVC diam: 2.10 cm TAPSE (M-mode): 1.7 cm LEFT ATRIUM              Index        RIGHT ATRIUM           Index LA diam:        4.30 cm  1.78 cm/m   RA Area:     18.10 cm LA Vol (A2C):   116.0 ml 47.89 ml/m  RA Volume:   46.80 ml  19.32 ml/m LA Vol (A4C):   119.0 ml 49.13 ml/m LA Biplane Vol: 126.0 ml 52.02 ml/m  AORTIC VALVE AV Area (Vmax):    2.36 cm AV Area (Vmean):   2.51  cm AV Area (VTI):     2.85 cm AV Vmax:           113.00 cm/s AV Vmean:          78.500 cm/s AV VTI:            0.174 m AV Peak Grad:      5.1 mmHg AV Mean Grad:      3.0 mmHg LVOT Vmax:         85.00 cm/s LVOT Vmean:        62.800 cm/s LVOT VTI:          0.158 m LVOT/AV VTI ratio: 0.91  AORTA Ao Root diam: 3.20 cm Ao Asc diam:  3.60 cm TRICUSPID VALVE TR Peak grad:   33.9 mmHg TR Vmax:        291.00 cm/s  SHUNTS Systemic VTI:  0.16 m Systemic Diam: 2.00 cm Rudean Haskell MD Electronically signed by Rudean Haskell MD Signature Date/Time: 05/26/2022/11:45:32 AM    Final    DG Chest 2 View  Result Date: 05/25/2022 CLINICAL DATA:  Provided history: Chest pain. Shortness of breath. Patient reports illness for 3 weeks. History of CHF. EXAM: CHEST - 2 VIEW COMPARISON:  Prior chest radiographs 11/10/2021 and earlier. FINDINGS: Cardiomegaly, unchanged. Central pulmonary vascular congestion. Interstitial and ill-defined airspace opacities, predominantly within the right mid and lower lung fields. Trace fissural fluid on the  right. No evidence of pleural effusion or pneumothorax. No acute bony abnormality identified. IMPRESSION: Cardiomegaly with central pulmonary vascular congestion. Interstitial and ill-defined airspace opacities, predominantly within the right mid and lower lung fields, which may reflect asymmetric edema or infection. Trace fissural fluid on the right. Electronically Signed   By: Kellie Simmering D.O.   On: 05/25/2022 11:32    Microbiology: No results found for this or any previous visit (from the past 240 hour(s)).   Labs: Basic Metabolic Panel: Recent Labs  Lab 05/25/22 1101 05/26/22 0401 05/27/22 0642 05/28/22 0907  NA 138 137 135 135  K 3.8 3.2* 4.1 4.3  CL 105 101 102 99  CO2 _0 GLUCOSE 248* 263* 196* 249*  BUN _1 CREATININE 0.87 1.05* 1.16* 1.27*  CALCIUM 9.6 9.0 8.4* 8.5*  MG 2.0  --  2.0  --    Liver Function Tests: Recent Labs  Lab  05/25/22 1101  AST 21  ALT 10  ALKPHOS 111  BILITOT 0.7  PROT 7.3  ALBUMIN 3.4*   No results for input(s): "LIPASE", "AMYLASE" in the last 168 hours. No results for input(s): "AMMONIA" in the last 168 hours. CBC: Recent Labs  Lab 05/25/22 1101  WBC 5.4  NEUTROABS 3.7  HGB 13.9  HCT 43.7  MCV 84.2  PLT 206   Cardiac Enzymes: No results for input(s): "CKTOTAL", "CKMB", "CKMBINDEX", "TROPONINI" in the last 168 hours. BNP: BNP (last 3 results) Recent Labs    11/10/21 1250 11/18/21 1135 05/25/22 1101  BNP 220.9* 213.0* 744.8*    ProBNP (last 3 results) No results for input(s): "PROBNP" in the last 8760 hours.  CBG: Recent Labs  Lab 05/27/22 1112 05/27/22 1632 05/27/22 2125 05/28/22 0637 05/28/22 1124  GLUCAP 268* 194* 342* 302* 197*       Signed:  Domenic Polite MD.  [facility] 05/28/2022, 2:45 PM

## 2022-05-28 NOTE — Progress Notes (Signed)
Rounding Note    Patient Name: [name] Date of Encounter: 05/28/2022  [place] Cardiologist: [name], MD   Subjective   Just return from [place] and run out of medications.  Breathing improved, patient thinks she is at baseline.  No chest pain.    Breathing seems to be very stable . Has not ambulated.  Refused to get up due to knee pain     Inpatient Medications    Scheduled Meds:  amLODipine  10 mg Oral Daily   carvedilol  25 mg Oral BID WC   escitalopram  20 mg Oral q morning   furosemide  40 mg Oral BID   glipiZIDE  10 mg Oral Q breakfast   hydrALAZINE  25 mg Oral Q8H   insulin aspart  0-15 Units Subcutaneous TID WC   insulin aspart  5 Units Subcutaneous TID WC   losartan  25 mg Oral Daily   pneumococcal 20-valent conjugate vaccine  0.5 mL Intramuscular Tomorrow-1000   risperidone  2 mg Oral Daily   rivaroxaban  20 mg Oral Q supper   sodium chloride flush  3 mL Intravenous Q12H   spironolactone  25 mg Oral Daily   Continuous Infusions:  sodium chloride     PRN Meds: sodium chloride, hydrALAZINE, hydrocortisone cream, hydrOXYzine, ipratropium-albuterol, ondansetron (ZOFRAN) IV, sodium chloride flush   Vital Signs    Vitals:   05/28/22 0050 05/28/22 0500 05/28/22 0535 05/28/22 0744  BP: 132/86  127/80 134/62  Pulse:    83  Resp: '16  20 20  '$ Temp: 98.2 F (36.8 C)  98 F (36.7 C) 98 F (36.7 C)  TempSrc: Oral  Oral Oral  SpO2: 96%  94% 92%  Weight:  (!) 140.1 kg    Height:        Intake/Output Summary (Last 24 hours) at 05/28/2022 1008 Last data filed at 05/28/2022 0900 Gross per 24 hour  Intake 723 ml  Output 1800 ml  Net -1077 ml       05/28/2022    5:00 AM 05/27/2022    4:00 AM 05/26/2022    3:25 AM  Last 3 Weights  Weight (lbs) 308 lb 13.8 oz 308 lb 305 lb 12.5 oz  Weight (kg) 140.1 kg 139.708 kg 138.7 kg      Telemetry    Afib with HR in the 80s - Personally Reviewed  ECG    N/A  Physical Exam    Physical Exam: Blood pressure 134/62, pulse 83, temperature 98 F (36.7 C), temperature source Oral, resp. rate 20, height '5\' 7"'$  (1.702 m), weight (!) 140.1 kg, SpO2 92 %.       GEN:  morbidly obese female,  in no acute distress HEENT: Normal NECK: No JVD; No carotid bruits LYMPHATICS: No lymphadenopathy CARDIAC: Irreg. Irreg.  HR is controlled. RESPIRATORY:  Clear to auscultation without rales, wheezing or rhonchi  ABDOMEN: Soft, non-tender, non-distended MUSCULOSKELETAL:  No edema; No deformity  SKIN: Warm and dry NEUROLOGIC:  Alert and oriented x 3   Labs    High Sensitivity Troponin:   Recent Labs  Lab 05/25/22 1101 05/25/22 1300  TROPONINIHS 8 9      Chemistry Recent Labs  Lab 05/25/22 1101 05/26/22 0401 05/27/22 0642  NA 138 137 135  K 3.8 3.2* 4.1  CL 105 101 102  CO2 '23 24 26  '$ GLUCOSE 248* 263* 196*  BUN '12 16 19  '$ CREATININE 0.87 1.05* 1.16*  CALCIUM 9.6 9.0 8.4*  MG  2.0  --  2.0  PROT 7.3  --   --   ALBUMIN 3.4*  --   --   AST 21  --   --   ALT 10  --   --   ALKPHOS 111  --   --   BILITOT 0.7  --   --   GFRNONAA >60 >60 56*  ANIONGAP '10 12 7      '$ Hematology Recent Labs  Lab 05/25/22 1101  WBC 5.4  RBC 5.19*  HGB 13.9  HCT 43.7  MCV 84.2  MCH 26.8  MCHC 31.8  RDW 17.2*  PLT 206    BNP Recent Labs  Lab 05/25/22 1101  BNP 744.8*      Radiology    ECHOCARDIOGRAM COMPLETE  Result Date: 05/26/2022    ECHOCARDIOGRAM REPORT   Patient Name:   [name] Date of Exam: 05/26/2022 Medical Rec #:  [id number]     Height:       67.0 in Accession #:    [id number]    Weight:       305.8 lb Date of Birth:  [birth year]     BSA:          2.422 m Patient Age:    56 years      BP:           136/93 mmHg Patient Gender: F             HR:           93 bpm. Exam Location:  Inpatient Procedure: 2D Echo, Cardiac Doppler and Color Doppler Indications:    CHF-acute diastolic  History:        Patient has prior history of Echocardiogram examinations,  most                 recent 04/18/2021. Pulmonary HTN, Arrythmias:Atrial Fibrillation;                 Risk Factors:Hypertension, Diabetes and Sleep Apnea. Cor                 pulmonale. HFpEF.  Sonographer:    Clayton Lefort RDCS (AE) Referring Phys: [id number] [place]  1. Left ventricular ejection fraction, by estimation, is 60 to 65%. The left ventricle has normal function. The left ventricle has no regional wall motion abnormalities. There is severe left ventricular hypertrophy. Left ventricular diastolic parameters  are indeterminate. Septal thickenness over 89m but difficult to measure.  2. Right ventricular systolic function is normal. The right ventricular size is normal. There is moderately elevated pulmonary artery systolic pressure.  3. Left atrial size was severely dilated.  4. A small pericardial effusion is present.  5. The mitral valve is normal in structure. Trivial mitral valve regurgitation. No evidence of mitral stenosis.  6. The aortic valve is tricuspid. Aortic valve regurgitation is not visualized.  7. The inferior vena cava is dilated in size with <50% respiratory variability, suggesting right atrial pressure of 15 mmHg. Conclusion(s)/Recommendation(s): Consider CMR for the evaluation of severe hypertrophy for evaluation of non obstructive hypertrophic cardiomyopathy. FINDINGS  Left Ventricle: Left ventricular ejection fraction, by estimation, is 60 to 65%. The left ventricle has normal function. The left ventricle has no regional wall motion abnormalities. The left ventricular internal cavity size was normal in size. There is  severe left ventricular hypertrophy. Left ventricular diastolic parameters are indeterminate. Right Ventricle: The right ventricular size is normal. No increase in right ventricular wall thickness. Right ventricular  systolic function is normal. There is moderately elevated pulmonary artery systolic pressure. The tricuspid regurgitant velocity is 2.91 m/s,  and with an assumed right atrial pressure of 15 mmHg, the estimated right ventricular systolic pressure is 47.8 mmHg. Left Atrium: Left atrial size was severely dilated. Right Atrium: Right atrial size was normal in size. Pericardium: A small pericardial effusion is present. Presence of epicardial fat layer. Mitral Valve: The mitral valve is normal in structure. Trivial mitral valve regurgitation. No evidence of mitral valve stenosis. Tricuspid Valve: The tricuspid valve is normal in structure. Tricuspid valve regurgitation is not demonstrated. No evidence of tricuspid stenosis. Aortic Valve: The aortic valve is tricuspid. Aortic valve regurgitation is not visualized. Aortic valve mean gradient measures 3.0 mmHg. Aortic valve peak gradient measures 5.1 mmHg. Aortic valve area, by VTI measures 2.85 cm. Pulmonic Valve: The pulmonic valve was normal in structure. Pulmonic valve regurgitation is trivial. No evidence of pulmonic stenosis. Aorta: The aortic root is normal in size and structure. Venous: The inferior vena cava is dilated in size with less than 50% respiratory variability, suggesting right atrial pressure of 15 mmHg. IAS/Shunts: No atrial level shunt detected by color flow Doppler.  LEFT VENTRICLE PLAX 2D LVIDd:         3.90 cm LVIDs:         2.30 cm LV PW:         2.60 cm LV IVS:        1.80 cm LVOT diam:     2.00 cm LV SV:         50 LV SV Index:   20 LVOT Area:     3.14 cm  RIGHT VENTRICLE          IVC RV Basal diam:  2.90 cm  IVC diam: 2.10 cm TAPSE (M-mode): 1.7 cm LEFT ATRIUM              Index        RIGHT ATRIUM           Index LA diam:        4.30 cm  1.78 cm/m   RA Area:     18.10 cm LA Vol (A2C):   116.0 ml 47.89 ml/m  RA Volume:   46.80 ml  19.32 ml/m LA Vol (A4C):   119.0 ml 49.13 ml/m LA Biplane Vol: 126.0 ml 52.02 ml/m  AORTIC VALVE AV Area (Vmax):    2.36 cm AV Area (Vmean):   2.51 cm AV Area (VTI):     2.85 cm AV Vmax:           113.00 cm/s AV Vmean:          78.500 cm/s AV VTI:             0.174 m AV Peak Grad:      5.1 mmHg AV Mean Grad:      3.0 mmHg LVOT Vmax:         85.00 cm/s LVOT Vmean:        62.800 cm/s LVOT VTI:          0.158 m LVOT/AV VTI ratio: 0.91  AORTA Ao Root diam: 3.20 cm Ao Asc diam:  3.60 cm TRICUSPID VALVE TR Peak grad:   33.9 mmHg TR Vmax:        291.00 cm/s  SHUNTS Systemic VTI:  0.16 m Systemic Diam: 2.00 cm Rudean Haskell MD Electronically signed by Rudean Haskell MD Signature Date/Time: 05/26/2022/11:45:32 AM    Final  Cardiac Studies   Echo 05/26/22  1. Left ventricular ejection fraction, by estimation, is 60 to 65%. The  left ventricle has normal function. The left ventricle has no regional  wall motion abnormalities. There is severe left ventricular hypertrophy.  Left ventricular diastolic parameters   are indeterminate. Septal thickenness over 30m but difficult to measure.   2. Right ventricular systolic function is normal. The right ventricular  size is normal. There is moderately elevated pulmonary artery systolic  pressure.   3. Left atrial size was severely dilated.   4. A small pericardial effusion is present.   5. The mitral valve is normal in structure. Trivial mitral valve  regurgitation. No evidence of mitral stenosis.   6. The aortic valve is tricuspid. Aortic valve regurgitation is not  visualized.   7. The inferior vena cava is dilated in size with <50% respiratory  variability, suggesting right atrial pressure of 15 mmHg.   Conclusion(s)/Recommendation(s): Consider CMR for the evaluation of severe  hypertrophy for evaluation of non obstructive hypertrophic cardiomyopathy.   Patient Profile     56y.o. female  with hx of HFpEF, persistent atrial fibrillation on Xarelto, hypertension, obesity, OSA not compliant with CPAP who is being seen 05/25/2022 for the evaluation of CHF.  [place]  Reports she ran out of all her medication several  weeks ago and has been experiencing increasing shortness of breath, nonproductive cough and lower extremity edema.    She presented to the internal medicine clinic with symptoms and was noted to be hypoxic, was referred to the ED for further evaluation.  Assessment & Plan    Acute on chronic diastolic CHF - treated with IV lasix, now on PO. Breathing has improved.   She was on Torsemide at home.  She seems to be doing well on Lasix 40 mg BID ( I suspect she was eating more salt at home)   No new recs.  She needs to ambulate   She has diuresed nicely .    2. HTN BP is well controlle.   3. Persistent atrial fib Her ventricular rate is well controlled.   4. AKI - Follow closely   5. LVH - Consider outpatient cMRI    For questions or updates, please contact CHamptonPlease consult [email or web address] for contact info under      PMertie Moores MD  05/28/2022 10:19 AM    [place]  [place]  [id number]: [phone] Fax: [phone]

## 2022-05-28 NOTE — Inpatient Diabetes Management (Signed)
Inpatient Diabetes Program Recommendations  AACE/ADA: New Consensus Statement on Inpatient Glycemic Control (2015)  Target Ranges:  Prepandial:   less than 140 mg/dL      Peak postprandial:   less than 180 mg/dL (1-2 hours)      Critically ill patients:  140 - 180 mg/dL   Lab Results  Component Value Date   GLUCAP 302 (H) 05/28/2022   HGBA1C 10.5 (H) 05/25/2022    Review of Glycemic Control  Latest Reference Range & Units 05/26/22 06:05 05/26/22 11:37 05/26/22 16:53 05/26/22 21:45 05/27/22 06:50 05/27/22 08:18  Glucose-Capillary 70 - 99 mg/dL 235 (H) 387 (H) 200 (H) 316 (H) 186 (H) 200 (H)   Diabetes history: DM 2 Outpatient Diabetes medications: Glipizide 10 mg Daily, Metformin 1000 mg bid Current orders for Inpatient glycemic control:  Glipizide 10 mg Daily Novolog 0-15 units tid  A1c 10.5% on 8/28  -  Increase Glipizide to 10 mg bid   Spoke with pt yesterday 8/29.  Pt reports not wanting to do insulin at this time at home.   D/c needs: Blood glucose meter kit order # [id number] Trulicity 3.88 mg weekly #[id number] Insulin pen needles order # [id number]  Thanks,  Tama Headings RN, MSN, BC-ADM Inpatient Diabetes Coordinator Team Pager [phone] (8a-5p)

## 2022-05-28 NOTE — TOC Transition Note (Signed)
Transition of Care [facility]) - CM/SW Discharge Note   Patient Details  Name: [name] MRN: [id number] Date of Birth: [birth year]  [facility] CM/SW Contact:  Zenon Mayo, RN Phone Number: 05/28/2022, 1:46 PM   Clinical Narrative:    Patient is for dc today, NCM assisted with Match Letter, TOC to fill meds.  She has no other needs.          Patient Goals and CMS Choice        Discharge Placement                       Discharge Plan and Services                                     Social Determinants of Health (SDOH) Interventions Food Insecurity Interventions: Intervention Not Indicated Financial Strain Interventions: Intervention Not Indicated Housing Interventions: Intervention Not Indicated Transportation Interventions: Intervention Not Indicated   Readmission Risk Interventions    04/23/2021    4:38 PM  Readmission Risk Prevention Plan  Transportation Screening Complete  HRI or [place] Complete  Social Work Consult for [place] Planning/Counseling Complete  Palliative Care Screening Not Applicable  Medication Review Press photographer) Complete

## 2022-05-28 NOTE — Plan of Care (Signed)

## 2022-05-29 ENCOUNTER — Other Ambulatory Visit: Payer: Self-pay

## 2022-06-01 ENCOUNTER — Other Ambulatory Visit (HOSPITAL_COMMUNITY): Payer: Self-pay

## 2022-06-01 NOTE — Progress Notes (Signed)
Heart and Vascular Center Transitions of Care Clinic Heart Failure Pharmacist Encounter  PCP: [name], NP PCP-Cardiologist: [name], MD  HPI: 56 yo F with PMH significant for HFpEF, afib, NSTEMI, pulmonary HTN, HTN, ILD, T2DM, morbid obesity, OSA with CPAP noncompliance, and bipolar disorder. Last seen in [facility] HF clinic 10/2021 for hospital f/u. NYHA class II-III symptoms, confounded by morbid obesity.   Pt presented for an office visit on 8/28 with Dr. Margarita Rana with complaints of cough and intermittent dyspnea present for 3 weeks, without chest pain. She had recently been out of the country for 3-4 months to [place] about 3 weeks prior and had ran out of her medications while she was away. She was unable to speak in complete sentences during the visit d/t dyspnea. She was referred to the ED for IV diuretics and acute CHF exacerbation.   ECHO showed LVEF of 60-65% with severe left ventricular hypertrophy. CXR demonstrated unchanged cardiomegaly with central pulmonary vascular congestion. Diuresed with IV lasix and was euvolemic at discharge.    Today, [name] presents to the [facility] for follow up.   Shortness of breath/dyspnea on exertion? {YES NO:22349}  Orthopnea/PND? {YES NO:22349} Edema? {YES NO:22349} Lightheadedness/dizziness? {YES NO:22349} Daily weights at home? {YES NO:22349} Blood pressure/heart rate monitoring at home? {YES [id number] Following low-sodium/fluid-restricted diet? {YES NO:22349} Taking medications as prescribed? {YES NO:22349}  HF Medications: torsemide '20mg'$  2 tabs BID, spironolactone '25mg'$  once daily, losartan '25mg'$  once daily, carvedilol 25 mg BID   Has the patient been experiencing any side effects to the medications prescribed?  {YES NO:22349}  Does the patient have any problems obtaining medications due to transportation or finances?   {YES NO:22349}  Understanding of regimen: {excellent/good/fair/poor:19665} Understanding  of indications: {excellent/good/fair/poor:19665} Potential of compliance: {excellent/good/fair/poor:19665} Patient understands to avoid NSAIDs. Patient understands to avoid decongestants.   Pertinent Lab Values: Serum creatinine ***, BUN ***, Potassium ***, Sodium ***, BNP ***, Magnesium ***, Digoxin ***   Vital Signs: Weight: *** lbs (discharge weight: 308 lbs) Blood pressure: ***  Heart rate: ***   Medication Assistance / Insurance Benefits Check: Does the patient have prescription insurance?  No Type of insurance plan: ***  Does the patient qualify for medication assistance through manufacturers or grants?   {Yes/No/Pending:24180} Eligible grants and/or patient assistance programs: *** Medication assistance applications in progress: ***  Medication assistance applications approved: *** Approved medication assistance renewals will be completed by: ***  Outpatient Pharmacy:  [facility] outpatient [facility] Was the [place] used to supply discharge medications? yes  If TOC pharmacy was used, were the refills transferred out to current pharmacy yet? No refills provided to [facility] pharmacy - ***    Assessment: 1) Chronic diastolic CHF (EF 71-24%), due to nonischemic causes. NYHA class *** symptoms. - SGLT2i intolerance in the past with symptoms of hallucinations and a drop in BG. -Diuretic *** -Could benefit from additional mortality benefit and HTN/diuretic effect from [place].  -Currently on appropriate beta blocker therapy with carvedilol.  - Spironolactone therapy appropriate to continue at this time. Could consider dose increase at next visit depending on renal function after Entresto initiation.   Plan: 1) Medication changes: - Initiate Entresto 24/'26mg'$  BID   2) Patient Assistance: -  3) Follow up: - Next appointment with *** on ***  Lind Guest, PharmD PGY-1 [facility] Resident

## 2022-06-02 ENCOUNTER — Telehealth: Payer: Self-pay

## 2022-06-02 NOTE — Telephone Encounter (Signed)
Transition Care Management Follow-up Telephone Call  Patient was asleep. Call completed with her mother, [place].  Date of discharge and from where: 05/28/2022, [facility]  How have you been since you were released from the hospital? Her mother said she is " doing great.": Any questions or concerns? No  Items Reviewed: Did the pt receive and understand the discharge instructions provided? Yes  Medications obtained and verified? Yes - she has all of her medications as well as a working glucometer.  Other? No  Any new allergies since your discharge? No  Dietary orders reviewed? Yes Do you have support at home? Yes , her mother  [place] and Equipment/Supplies: Were home health services ordered? no If so, what is the name of the agency? N/a  Has the agency set up a time to come to the patient's home? not applicable Were any new equipment or medical supplies ordered?  No What is the name of the medical supply agency? N/a Were you able to get the supplies/equipment? not applicable Do you have any questions related to the use of the equipment or supplies? No  Functional Questionnaire: (I = Independent and D = Dependent) ADLs: independent  Follow up appointments reviewed:  PCP Hospital f/u appt confirmed? Yes  Scheduled to see Geryl Rankins, NP - 06/22/2022.  [facility] f/u appt confirmed? Yes  Scheduled to see [place]- 06/03/2022.   Are transportation arrangements needed? No  If their condition worsens, is the pt aware to call PCP or go to the Emergency Dept.? Yes Was the patient provided with contact information for the PCP's office or ED? Yes Was to pt encouraged to call back with questions or concerns? Yes

## 2022-06-03 ENCOUNTER — Telehealth (HOSPITAL_COMMUNITY): Payer: Self-pay | Admitting: *Deleted

## 2022-06-03 ENCOUNTER — Other Ambulatory Visit: Payer: Self-pay

## 2022-06-03 ENCOUNTER — Encounter (HOSPITAL_COMMUNITY): Payer: Medicaid Other

## 2022-06-03 ENCOUNTER — Ambulatory Visit (HOSPITAL_COMMUNITY)
Admit: 2022-06-03 | Discharge: 2022-06-03 | Disposition: A | Discharge status: Home / Self Care | Payer: Medicaid Other | Source: Ambulatory Visit | Attending: Adult Health | Admitting: Adult Health

## 2022-06-03 ENCOUNTER — Other Ambulatory Visit (HOSPITAL_COMMUNITY): Payer: Self-pay

## 2022-06-03 VITALS — BP 158/90 | HR 90 | Wt 315.0 lb

## 2022-06-03 DIAGNOSIS — I5032 Chronic diastolic (congestive) heart failure: Secondary | ICD-10-CM

## 2022-06-03 DIAGNOSIS — G4733 Obstructive sleep apnea (adult) (pediatric): Secondary | ICD-10-CM | POA: Insufficient documentation

## 2022-06-03 DIAGNOSIS — Z91128 Patient's intentional underdosing of medication regimen for other reason: Secondary | ICD-10-CM | POA: Insufficient documentation

## 2022-06-03 DIAGNOSIS — I272 Pulmonary hypertension, unspecified: Secondary | ICD-10-CM | POA: Insufficient documentation

## 2022-06-03 DIAGNOSIS — I252 Old myocardial infarction: Secondary | ICD-10-CM | POA: Insufficient documentation

## 2022-06-03 DIAGNOSIS — J849 Interstitial pulmonary disease, unspecified: Secondary | ICD-10-CM | POA: Insufficient documentation

## 2022-06-03 DIAGNOSIS — I4819 Other persistent atrial fibrillation: Secondary | ICD-10-CM | POA: Insufficient documentation

## 2022-06-03 DIAGNOSIS — F319 Bipolar disorder, unspecified: Secondary | ICD-10-CM | POA: Insufficient documentation

## 2022-06-03 DIAGNOSIS — Z7901 Long term (current) use of anticoagulants: Secondary | ICD-10-CM | POA: Insufficient documentation

## 2022-06-03 DIAGNOSIS — I110 Hypertensive heart disease with heart failure: Secondary | ICD-10-CM | POA: Insufficient documentation

## 2022-06-03 DIAGNOSIS — Z79899 Other long term (current) drug therapy: Secondary | ICD-10-CM | POA: Insufficient documentation

## 2022-06-03 DIAGNOSIS — Z7984 Long term (current) use of oral hypoglycemic drugs: Secondary | ICD-10-CM | POA: Insufficient documentation

## 2022-06-03 DIAGNOSIS — I10 Essential (primary) hypertension: Secondary | ICD-10-CM

## 2022-06-03 DIAGNOSIS — Z7985 Long-term (current) use of injectable non-insulin antidiabetic drugs: Secondary | ICD-10-CM | POA: Insufficient documentation

## 2022-06-03 DIAGNOSIS — Z6841 Body Mass Index (BMI) 40.0 and over, adult: Secondary | ICD-10-CM | POA: Insufficient documentation

## 2022-06-03 DIAGNOSIS — Z91199 Patient's noncompliance with other medical treatment and regimen due to unspecified reason: Secondary | ICD-10-CM | POA: Insufficient documentation

## 2022-06-03 DIAGNOSIS — Z8679 Personal history of other diseases of the circulatory system: Secondary | ICD-10-CM | POA: Insufficient documentation

## 2022-06-03 LAB — BASIC METABOLIC PANEL
Anion gap: 10 (ref 5–15)
BUN: 20 mg/dL (ref 6–20)
CO2: 26 mmol/L (ref 22–32)
Calcium: 9.4 mg/dL (ref 8.9–10.3)
Chloride: 99 mmol/L (ref 98–111)
Creatinine, Ser: 1.13 mg/dL — ABNORMAL HIGH (ref 0.44–1.00)
GFR, Estimated: 58 mL/min — ABNORMAL LOW (ref >60)
Glucose, Bld: 273 mg/dL — ABNORMAL HIGH (ref 70–99)
Potassium: 4.9 mmol/L (ref 3.5–5.1)
Sodium: 135 mmol/L (ref 135–145)

## 2022-06-03 LAB — BRAIN NATRIURETIC PEPTIDE: B Natriuretic Peptide: 338.9 pg/mL — ABNORMAL HIGH (ref 0.0–100.0)

## 2022-06-03 MED ORDER — TORSEMIDE 20 MG PO TABS
40.0000 mg | ORAL_TABLET | Freq: Two times a day (BID) | ORAL | 3 refills | Status: DC
  Filled 2022-06-03 – 2022-06-17 (×2): qty 60, 15d supply, fill #0

## 2022-06-03 MED ORDER — HYDRALAZINE HCL 25 MG PO TABS
25.0000 mg | ORAL_TABLET | Freq: Three times a day (TID) | ORAL | 3 refills | Status: DC
  Filled 2022-06-03 – 2022-06-17 (×2): qty 90, 30d supply, fill #0

## 2022-06-03 MED ORDER — EMPAGLIFLOZIN 10 MG PO TABS
10.0000 mg | ORAL_TABLET | Freq: Every day | ORAL | 3 refills | Status: DC
  Filled 2022-06-03: qty 30, 30d supply, fill #0

## 2022-06-03 NOTE — Telephone Encounter (Signed)
Called to confirm Heart & Vascular Transitions of Care appointment at 10 am on 06/03/22. Patient reminded to bring all medications and pill box organizer with them. Confirmed patient has transportation. Gave directions, instructed to utilize [place] parking.  Confirmed appointment with patients mother Della Goo)  Confirmed appointment prior to ending call.    Earnestine Leys, BSN, Clinical cytogeneticist Only

## 2022-06-03 NOTE — Patient Instructions (Signed)
Medication Changes:  START jardiance. 1 tab daily.  START torsemide. 2 tablets ('40mg'$ ), TWO times a day.   START hydralazine. 1 tablet ('25mg'$ ), THREE times a day.   RESUME glipizide. 1 tablet, TWO times a day.  RESUME metformin. 2 tablets ('1000mg'$ ), TWO times a day.    Lab Work:  Labs done today, your results will be available in MyChart, we will contact you for abnormal readings.  Testing/Procedures:  none  Referrals:  None.  Special Instructions // Education:  Do the following things EVERYDAY: Weigh yourself in the morning before breakfast. Write it down and keep it in a log. Take your medicines as prescribed Eat low salt foods--Limit salt (sodium) to 2000 mg per day.  Stay as active as you can everyday Limit all fluids for the day to less than 2 liters  Follow-Up in: [facility], September 13 @ 10AM.

## 2022-06-03 NOTE — Progress Notes (Signed)
Heart and Vascular Care Navigation  06/03/2022  [name] 1965-12-27 [id number]  Reason for Referral: lack of insurance   Engaged with patient face to face for initial visit for Heart and Vascular Care Coordination.                                                                                                   Assessment:    CSW met with [name] to discuss above concern.  [name] listed as having Family Planning Medicaid but [name] under the impression she has full Medicaid and just completed recertification paperwork 3 weeks ago.  Per chart review [name] did have active full Medicaid back in Feb but Pharm D confirmed in [place] that [name] only has Family Planning Medicaid at this time.  CSW encouraged [name] to go to [facility] to discuss case with Medicaid case worker.  Needs to clarify if when she sent in recertification paperwork that she was only granted [facility] and was found ineligible for full Medicaid or that recertification is pending still and she has Family Planning in the meantime.  CSW provided [name] with CAFA application to help with [facility] bill not covered by insurance at this time.                                 HRT/VAS Care Coordination     Living arrangements for the past 2 months Apartment   Lives with: Self   Patient Has Concern With Paying Medical Bills Yes   Medical Bill Referrals: CAFA   Does Patient Have Prescription Coverage? No   Home Assistive Devices/Equipment Shower chair with back   DME Agency NA   [place] History:                                                                             SDOH Screenings   Food Insecurity: No Food Insecurity (05/26/2022)  Housing: Low Risk  (05/26/2022)  Transportation Needs: No Transportation Needs (05/26/2022)  Alcohol Screen: Low Risk  (05/26/2022)  Depression (PHQ2-9): Low Risk  (07/30/2021)  Financial Resource Strain: Low Risk  (05/26/2022)  Stress: No Stress Concern Present (07/15/2018)   Tobacco Use: Low Risk  (05/26/2022)    SDOH Interventions: Financial Resources:  Museum/gallery curator Strain Interventions: Research scientist (physical sciences) Counseling for [place]:   $46 in [place]:   Homeless for 8 months  Transportation:    Uses the bus system     Follow-up plan:    [name] to go to [facility] to clarify medicaid status and next steps.  [name] to complete CAFA to help with hospital bill  CSW to see [name] during  next clinic visit  Jorge Ny, LCSW Clinical Social Worker Advanced Heart Failure Clinic Desk#: [phone] Cell#: [phone]

## 2022-06-03 NOTE — Progress Notes (Signed)
HEART IMPACT TRANSITIONS OF CARE    PCP: Archie Patten NP Primary Cardiologist: Dr [name]   HPI: Mrs Lashon 56 y/o female with a history of chronic diastolic heart failure, persistent atrial fibrillation on Xarelto, hypertension, obesity and OSA, not compliant w/ CPAP. 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  Did have VFib with heart cath, treated w/ defibrillation. Echo 04/18/21 with EF 60-65%, no RWMA, severe LVH, RV normal with moderately elevated PA systolic pressure, Lt atrial size severely dilated and RA mildly dilated.  Mild MR, mod to severe TR.    Admitted 0/03/49 for a/c diastolic CHF w/ fluid overload. Afib well rate controlled. Diuresed w/ IV Lasix. Transitioned back to PO diuretics, torsemide 80 mg bid. Also on Sprio 25 mg daily + Jardiance 10 mg daily. Discharge weight 328 pounds.   Admitted 05/25/22 with A/C HFpEF. Ran out her medications while traveling out of the country. Diuresed with IV lasix and transitioned to torsemide 40 mg twice a day . Discharge weight 308 pounds.   Today she returns for HF follow up with her Mom .Overall feeling fair. Felt pretty good when she left the hospital but now sleeping in a recliner. SOB with steps.  Denies PND. + Orthopnea.  Mom says she drinks lots of water. Eating soup for most meals. Appetite ok. No fever or chills. Weighing 2 times a week. Weight at home 310  pounds. Says her weight is going up.  Her Mom is unable to help with her medications. Only taking torsemide once a day. Has not been taking hydralazine.  She has not been taking glipizide and only taking metformin once a day.    Cardiac Testing  2D Echo 04/18/21 EF 60-65% RV normal LA severely dilated. RA mildly dilated. TR mod-severe Mild MR . 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 EF >65%.   ROS: All systems negative except as listed in HPI, PMH  and Problem List.  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 But ran out of money and is no longer attending school. (Note patient has bipolar disorder).      Born in [place] but lived in [place] before coming back to [place].       Primary language is Arabic. Lives with mother and brother.   Social Determinants of Health   Financial Resource Strain: Low Risk  (05/26/2022)   Overall Financial Resource Strain (CARDIA)    Difficulty of Paying Living Expenses: Not very hard  Food Insecurity: No Food Insecurity (05/26/2022)   Hunger Vital Sign    Worried About Running Out of Food in the Last Year: Never true  Ran Out of Food in the Last Year: Never true  Transportation Needs: No Transportation Needs (05/26/2022)   PRAPARE - Hydrologist (Medical): No    Lack of Transportation (Non-Medical): No  Physical Activity: Not on file  Stress: No Stress Concern Present (07/15/2018)   [place]    Feeling of Stress : Only a little  Social Connections: Not on file  Intimate Partner Violence: Not on file    FH:  Family History  Problem Relation Age of Onset   Heart failure Father    Stroke Father    Hypertension Mother    Heart disease Maternal Grandfather     Past Medical History:  Diagnosis Date    Acute on chronic diastolic congestive heart failure ([place]) 11/02/2013   10/03/2015, 11/13/2015, 08/03/2017   Benign essential HTN 11/28/2013   Bipolar disease, chronic ([place])    Chest pain    a. 2012 Myoview: EF 63%, no isch/infarct;  b. 04/2016 Lexiscan MV: EF 73%, no ischemia/infarct-->Low risk.   Chronic diastolic CHF (congestive heart failure) ([place]) 07/23/2011   a. 2015 Echo: EF 55-60%, Gr2 DD;  b. 09/2015 Echo: EF 60-65%, no rwma, mod dil LA, PASP 1mHg.   Cor pulmonale (chronic) (HCC)    History of thyrotoxicosis    HTN (hypertension) 11/28/2013   Hypertensive heart disease 10/18/2013   Hypoglycemia    Insulin dependent type 2 diabetes mellitus, uncontrolled    Mediastinal adenopathy    Morbid obesity due to excess calories ([facility] 02/19/2011   Morbid obesity with BMI of 50.0-59.9, adult (HCC)    OSA (obstructive sleep apnea) 03/06/2011   Persistent atrial fibrillation ([facility] 12/09/2017   Pulmonary HTN, moderate to severe 11/03/2013   Sinusitis, chronic 01/02/2015   SVT (supraventricular tachycardia) ([facility] 12/06/2013   Uncontrolled type 2 diabetes mellitus with hyperglycemia (HCC)     Current Outpatient Medications  Medication Sig Dispense Refill   Accu-Chek Softclix Lancets lancets Use up to 4 times daily 100 each 0   amLODipine (NORVASC) 10 MG tablet Take 1 tablet (10 mg total) by mouth daily. 30 tablet 0   Blood Glucose Monitoring Suppl (ACCU-CHEK GUIDE) w/Device KIT Use to check blood sugar three times daily. 1 kit 0   Blood Pressure Monitor DEVI Please provide patient with insurance approved blood pressure monitor I10.0 1 each 0   carvedilol (COREG) 25 MG tablet Take 1 tablet (25 mg total) by mouth 2 (two) times daily with a meal. 60 tablet 0   Dulaglutide (TRULICITY) 1.5 MLP/3.7TKSOPN Inject 1.5 mg into the skin once a week. 6 mL 0   escitalopram (LEXAPRO) 20 MG tablet Take 1 tablet (20 mg total) by mouth every morning. 30 tablet 0   glucose blood (ACCU-CHEK GUIDE) test strip Use as  directed up to 4 times daily 100 each 0   Incontinence Supply Disposable (INCONTINENCE BRIEF LARGE) MISC Please provide patient with insurance approved incontinence supplies/briefs 18 each 6   losartan (COZAAR) 25 MG tablet Take 1 tablet (25 mg total) by mouth daily. Please fill as a 90 day supply 30 tablet 0   metFORMIN (GLUCOPHAGE) 1000 MG tablet Take 1,000 mg by mouth daily.     Naphazoline-Pheniramine (EYE ALLERGY RELIEF OP) Place 1 drop into both eyes daily as needed (allergy).     risperiDONE (RISPERDAL) 2 MG tablet Take 1 tablet (2 mg total) by mouth at bedtime. 30 tablet 0   rivaroxaban (XARELTO) 20 MG  TABS tablet Take 1 tablet (20 mg total) by mouth daily with supper. 30 tablet 0   spironolactone (ALDACTONE) 25 MG tablet Take 1 tablet (25 mg total) by mouth daily. 30 tablet 0   torsemide (DEMADEX) 20 MG tablet Take 40 mg by mouth daily.     No current facility-administered medications for this encounter.    Vitals:   06/03/22 1024  BP: (!) 158/90  Pulse: 90  SpO2: 96%  Weight: (!) 142.9 kg (315 lb)   Wt Readings from Last 3 Encounters:  06/03/22 (!) 142.9 kg (315 lb)  05/28/22 (!) 140.1 kg (308 lb 13.8 oz)  05/25/22 (!) 141 kg (310 lb 12.8 oz)    PHYSICAL EXAM: General:  Walked in the clinic. No resp difficulty HEENT: normal Neck: supple. JVP difficult to assess due to body habitus. Carotids 2+ bilaterally; no bruits. No lymphadenopathy or thryomegaly appreciated. Cor: PMI normal. Regular rate & rhythm. No rubs, gallops or murmurs. Lungs: clear Abdomen: obese, soft, nontender, nondistended. No hepatosplenomegaly. No bruits or masses. Good bowel sounds. Extremities: no cyanosis, clubbing, rash, R and LLE 1+ edema Neuro: alert & orientedx3, cranial nerves grossly intact. Moves all 4 extremities w/o difficulty. Affect pleasant.  ASSESSMENT & PLAN:  1. Chronic Diastolic Heart Failure  - most recent echo 7/22 EF 60-65%, severe LVH, RV mildly dilated normal systolic function   - NYHA III.  - Reds 45%. On exam she appears volume overloaded. Suspect in the setting of increased fluid intake, high sodium diet, and medication errors.   - Increase torsemide to 40 mg twice a day.  - continue spiro 25 mg daily  - Restart Jardiance 10 mg daily  - Check BMET BNP   2. Hypertension  Elevated. Restart hydralazine 25 mg three times a day.    3. Chronic Afib - Rate controlled.  - Continue coreg 25 mg twice a day.  - on xarelto  4. OSA - non compliant w/ CPAP    5. Morbid Obesity Body mass index is 49.34 kg/m. Discussed portion control.    6. DM II  -  continue Jardiance  - Needs follow up with PCP  Patient assistance started for Jardiance.     Follow up next week to reassess volume status and verify medications. [facility] pharmacy team will meet with her next week to help with pill box education. She was asked to bring all medications to her appointment.    Illianna Paschal NP-C  11:03 AM

## 2022-06-05 ENCOUNTER — Telehealth (HOSPITAL_COMMUNITY): Payer: Self-pay

## 2022-06-05 NOTE — Telephone Encounter (Signed)
Patient assistance application for Jardiance initiated. Patient portion completed and signed. Faxed to [place] for provider completion and submission.  Kerby Nora, PharmD, BCPS Heart Failure Stewardship Pharmacist Phone [phone]

## 2022-06-09 NOTE — Progress Notes (Signed)
[facility] Transitions of Care Clinic Heart Failure Pharmacist Encounter  PCP: [name], NP PCP-Cardiologist: [name], MD  HPI: 56 yo F with PMH significant for HFpEF, afib, NSTEMI, pulmonary HTN, HTN, ILD, T2DM, morbid obesity, OSA with CPAP noncompliance, and bipolar disorder. Last seen in [facility] HF clinic 10/2021 for hospital f/u. NYHA class II-III symptoms, confounded by morbid obesity.    Pt presented for an office visit on 8/28 with Dr. Margarita Rana with complaints of cough and intermittent dyspnea present for 3 weeks, without chest pain. She had recently been out of the country for 3-4 months to [place] about 3 weeks prior and had ran out of her medications while she was away. She was unable to speak in complete sentences during the visit d/t dyspnea. She was referred to the ED for IV diuretics and acute CHF exacerbation.    ECHO showed LVEF of 60-65% with severe left ventricular hypertrophy. CXR demonstrated unchanged cardiomegaly with central pulmonary vascular congestion. Diuresed with IV lasix and was euvolemic at discharge.    At last visit, she endorsed SOB with exertion and orthopnea. Volume overloaded with a ReDs score of 45% and bilateral LEE. She denied taking her BP at home, but does take daily weights 2-3 times a week. She has been limiting salt and fast food consumption post-discharge, but struggles to limit fluid intake. There was some confusion with which medications she should be taking at home. She stopped taking her hydralazine and utilizes one tablet of torsemide '20mg'$  once daily. Patient did not note any previous issues with Jardiance intolerance. Patient assistance was initiated for Jardiance. Patient was instructed to take 2 tablets of her torsemide twice daily and to restart her hydralazine. Instructed to bring all of her pill bottles from home along with a pill box for her visit today.   Today she presented to the [facility] HF clinic for a follow up, Tamma Brigandi presents to the [facility] for follow up. She endorses SOB at rest and LEE. She complained of chest pain at this visit. She has not been able to limit her fluid intake, but stays she urinates frequently. She stated she was limited salt intake but also stated she frequently eats take out. She was able to verbalize her current medication therapies at this visit.   HF Medications:torsemide '20mg'$  2 tabs BID, spironolactone '25mg'$  once daily, losartan '25mg'$  once daily, carvedilol 25 mg BID, hydralazine '25mg'$  TID    Has the patient been experiencing any side effects to the medications prescribed?  no  Does the patient have any problems obtaining medications due to transportation or finances?   no  Understanding of regimen: excellent Understanding of indications: good Potential of compliance: good Patient understands to avoid NSAIDs. Patient understands to avoid decongestants.   Pertinent Lab Values: Serum creatinine 1.12, BUN 25, Potassium 3.7, Sodium 139, BNP 337.6, Magnesium 2.0 8/30)  Vital Signs: Weight: 315 lbs (weight at last visit: 315 lbs) Blood pressure: 114/88  Heart rate: 91   Medication Assistance / Insurance Benefits Check: Does the patient have prescription insurance?  No  Does the patient qualify for medication assistance through manufacturers or grants?   Pending Eligible grants and/or patient assistance programs: none Medication assistance applications in progress: Jardiance, Xarelto, and Entresto- patient samples provided Medication assistance applications approved: none Approved medication assistance renewals will be completed by: Dr. Aundra Dubin  Outpatient Pharmacy:  [facility] outpatient [facility] Was the [facility] pharmacy used to  supply discharge medications? yes   Assessment: 1) Chronic diastolic CHF (EF 45-36%), due to nonischemic causes. NYHA class IV symptoms. - SGLT2i intolerance in the past to [place] with  symptoms of hallucinations and a drop in BG. Patient assistance currently pending for Jardiance. - Diuretic therapy appropriate to continue. Still volume overloaded but d/t fluid intake - Could benefit from additional mortality benefit and HTN/diuretic effect from [place]. - Spironolactone therapy appropriate to continue at this time.  Plan: 1) Medication changes: - Initiate Entresto 24/'26mg'$  BID   2) Patient Assistance: -Pending Alcide Clever, and Entresto application  3) Follow up: - Next appointment with Coletta Memos on 06/19/22  Maryan Puls, PharmD PGY-1 [facility] Resident

## 2022-06-10 ENCOUNTER — Ambulatory Visit (HOSPITAL_COMMUNITY)
Admission: RE | Admit: 2022-06-10 | Discharge: 2022-06-10 | Disposition: A | Discharge status: Home / Self Care | Payer: Medicaid Other | Source: Ambulatory Visit | Attending: Cardiology | Admitting: Cardiology

## 2022-06-10 ENCOUNTER — Encounter (HOSPITAL_COMMUNITY): Payer: Self-pay

## 2022-06-10 ENCOUNTER — Other Ambulatory Visit (HOSPITAL_COMMUNITY): Payer: Self-pay

## 2022-06-10 ENCOUNTER — Telehealth (HOSPITAL_COMMUNITY): Payer: Self-pay | Admitting: *Deleted

## 2022-06-10 ENCOUNTER — Other Ambulatory Visit: Payer: Self-pay

## 2022-06-10 VITALS — BP 114/88 | HR 91 | Wt 315.4 lb

## 2022-06-10 DIAGNOSIS — Z7985 Long-term (current) use of injectable non-insulin antidiabetic drugs: Secondary | ICD-10-CM | POA: Insufficient documentation

## 2022-06-10 DIAGNOSIS — I5032 Chronic diastolic (congestive) heart failure: Secondary | ICD-10-CM | POA: Insufficient documentation

## 2022-06-10 DIAGNOSIS — I482 Chronic atrial fibrillation, unspecified: Secondary | ICD-10-CM | POA: Insufficient documentation

## 2022-06-10 DIAGNOSIS — Z7901 Long term (current) use of anticoagulants: Secondary | ICD-10-CM | POA: Insufficient documentation

## 2022-06-10 DIAGNOSIS — R0609 Other forms of dyspnea: Secondary | ICD-10-CM | POA: Insufficient documentation

## 2022-06-10 DIAGNOSIS — Z794 Long term (current) use of insulin: Secondary | ICD-10-CM | POA: Insufficient documentation

## 2022-06-10 DIAGNOSIS — Z91148 Patient's other noncompliance with medication regimen for other reason: Secondary | ICD-10-CM | POA: Insufficient documentation

## 2022-06-10 DIAGNOSIS — Z79899 Other long term (current) drug therapy: Secondary | ICD-10-CM | POA: Insufficient documentation

## 2022-06-10 DIAGNOSIS — E119 Type 2 diabetes mellitus without complications: Secondary | ICD-10-CM | POA: Insufficient documentation

## 2022-06-10 DIAGNOSIS — Z6841 Body Mass Index (BMI) 40.0 and over, adult: Secondary | ICD-10-CM | POA: Insufficient documentation

## 2022-06-10 DIAGNOSIS — I110 Hypertensive heart disease with heart failure: Secondary | ICD-10-CM | POA: Insufficient documentation

## 2022-06-10 DIAGNOSIS — Z7984 Long term (current) use of oral hypoglycemic drugs: Secondary | ICD-10-CM | POA: Insufficient documentation

## 2022-06-10 DIAGNOSIS — R0602 Shortness of breath: Secondary | ICD-10-CM | POA: Insufficient documentation

## 2022-06-10 DIAGNOSIS — I517 Cardiomegaly: Secondary | ICD-10-CM

## 2022-06-10 DIAGNOSIS — G4733 Obstructive sleep apnea (adult) (pediatric): Secondary | ICD-10-CM | POA: Insufficient documentation

## 2022-06-10 LAB — BASIC METABOLIC PANEL
Anion gap: 7 (ref 5–15)
BUN: 25 mg/dL — ABNORMAL HIGH (ref 6–20)
CO2: 28 mmol/L (ref 22–32)
Calcium: 9.2 mg/dL (ref 8.9–10.3)
Chloride: 104 mmol/L (ref 98–111)
Creatinine, Ser: 1.12 mg/dL — ABNORMAL HIGH (ref 0.44–1.00)
GFR, Estimated: 58 mL/min — ABNORMAL LOW (ref >60)
Glucose, Bld: 94 mg/dL (ref 70–99)
Potassium: 3.7 mmol/L (ref 3.5–5.1)
Sodium: 139 mmol/L (ref 135–145)

## 2022-06-10 LAB — BRAIN NATRIURETIC PEPTIDE: B Natriuretic Peptide: 337.6 pg/mL — ABNORMAL HIGH (ref 0.0–100.0)

## 2022-06-10 LAB — D-DIMER, QUANTITATIVE: D-Dimer, Quant: 0.34 ug/mL-FEU (ref 0.00–0.50)

## 2022-06-10 MED ORDER — ENTRESTO 24-26 MG PO TABS
1.0000 | ORAL_TABLET | Freq: Two times a day (BID) | ORAL | 3 refills | Status: DC
  Filled 2022-06-10: qty 60, 30d supply, fill #0

## 2022-06-10 MED ORDER — EMPAGLIFLOZIN 10 MG PO TABS
10.0000 mg | ORAL_TABLET | Freq: Every day | ORAL | 3 refills | Status: DC
  Filled 2022-06-10 – 2022-06-17 (×2): qty 30, 30d supply, fill #0

## 2022-06-10 NOTE — Progress Notes (Signed)
CSW met with pt to check in about progress with finding out what happened to her full Medicaid.  Pt reports she could not find the letter that came with her Medicaid card explaining benefits.  CSW encouraged pt to call [employer] Medicaid case worker to inquire what happened that her benefits are now just family planning.  Jorge Ny, LCSW Clinical Social Worker Advanced Heart Failure Clinic Desk#: [phone] Cell#: [phone]

## 2022-06-10 NOTE — Progress Notes (Signed)
Medication Samples have been provided to the patient.  Drug name: Entresto       Strength: 24/26        Qty: 56  LOT: [id number]  Exp.Date: 04/2024  Dosing instructions: Patient takes 1 tablet by mouth twice a day.   Medication Samples have been provided to the patient.  Drug name: Jardiance       Strength: 10 mg        Qty: 28  LOT: [id number]  Exp.Date: 10/25  Dosing instructions: Patient takes 1 tablet by mouth daily.   The patient has been instructed regarding the correct time, dose, and frequency of taking this medication, including desired effects and most common side effects.   [name] Emett Stapel 11:04 AM 06/10/2022

## 2022-06-10 NOTE — Telephone Encounter (Signed)
Call attempted to confirm HV TOC appt on 06/10/22 @ 10 am. .HIPPA appropriate VM left with callback number.    Earnestine Leys, BSN, Clinical cytogeneticist Only

## 2022-06-10 NOTE — Progress Notes (Signed)
HEART IMPACT TRANSITIONS OF CARE PROGRESS NOTE     PCP: Archie Patten NP Primary Cardiologist: Dr [name]   HPI: [name] 56 y/o female with a history of chronic diastolic heart failure, persistent atrial fibrillation on Xarelto, hypertension, obesity and OSA, not compliant w/ CPAP. 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  Did have VFib with heart cath, treated w/ defibrillation. Echo 04/18/21 with EF 60-65%, no RWMA, severe LVH, RV normal with moderately elevated PA systolic pressure, Lt atrial size severely dilated and RA mildly dilated.  Mild MR, mod to severe TR.    Admitted 5/39/76 for a/c diastolic CHF w/ fluid overload. Afib well rate controlled. Diuresed w/ IV Lasix. Transitioned back to PO diuretics, torsemide 80 mg bid. Also on Sprio 25 mg daily + Jardiance 10 mg daily. Discharge weight 328 pounds.   Admitted 05/25/22 with A/C HFpEF. Ran out her medications while [place]. Diuresed with IV lasix and transitioned to torsemide 40 mg twice a day . 2D Echo showed LVEF 60-65%, severe LVH, Septal thickenness over 17m but difficult to measure, RV normal.  Discharge weight 308 pounds.   [facility] Had [facility] 9/6. Volume was up, in setting of dietary indiscretion and poor med compliance. Only taking torsemide once daily. Complained of exertional dyspnea and orthopnea. No PND. Torsemide increased to 40 mg bid. Jardiance restarted, hydralazine restarted (was not taking at home).   Returns today for 2nd visit and to reassess volume status. Here w/ her mother. Complains of continued dyspnea on exertion and sharp, pleuritic chest pain. Also some SOB at rest. EKG shows chronic Afib, 98 bpm. Appears anxious on exam. O2 sats 93% on RA. Suspect OHS. Has known OSA but not compliant w/ CPAP. Says device no longer works. She reports full med compliance. I tested her med literacy and she knows  her meds by name and ordered doses/frequency. She admits to dietary indiscretion w/ sodium and continued high fluid intake. Not checking wt daily at home but has a scale. Pt brought in all home meds with her today. Appears to have all meds ordered except Jardiance.       Cardiac Testing  2D Echo 04/18/21 EF 60-65% RV normal LA severely dilated. RA mildly dilated. TR mod-severe Mild MR . Severe LVH 2D Echo 8/23  LVEF 60-65%, severe LVH, Septal thickenness over 257mbut difficult to measure, RV normal.    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 EF >65%.   Chest CT 2019 Prominent mediastinal lymph nodes, likely reactive. Lungs/Pleura: +Calcified granulomas.  ROS: All systems negative except as listed in HPI, PMH and Problem List.  SH:  Social History   Socioeconomic History   Marital status: Single    Spouse name: Not on file   Number of children: 0   Years of education: 18   Highest education level: Not on file  Occupational History   Occupation: unemployed  Tobacco Use   Smoking status: Never   Smokeless tobacco: Never  Vaping Use   Vaping Use: Never used  Substance and Sexual Activity   Alcohol use: No   Drug use: No   Sexual activity: Not Currently    Birth control/protection: None  Other Topics Concern   Not on file  Social History Narrative   [place]  Then was enrolled in a MPH program at [employer]. But ran out of money and is no longer attending school. (Note patient has bipolar disorder).      Born in [place] but lived in [place] before coming back to [place].       Primary language is Arabic. Lives with mother and brother.   Social Determinants of Health   Financial Resource Strain: Low Risk  (05/26/2022)   Overall Financial Resource Strain (CARDIA)    Difficulty of Paying Living Expenses: Not very hard  Food Insecurity: No Food Insecurity (05/26/2022)   Hunger Vital  Sign    Worried About Running Out of Food in the Last Year: Never true    Ran Out of Food in the Last Year: Never true  Transportation Needs: No Transportation Needs (05/26/2022)   PRAPARE - Hydrologist (Medical): No    Lack of Transportation (Non-Medical): No  Physical Activity: Not on file  Stress: No Stress Concern Present (07/15/2018)   [place]    Feeling of Stress : Only a little  Social Connections: Not on file  Intimate Partner Violence: Not on file    FH:  Family History  Problem Relation Age of Onset   Heart failure Father    Stroke Father    Hypertension Mother    Heart disease Maternal Grandfather     Past Medical History:  Diagnosis Date   Acute on chronic diastolic congestive heart failure ([place]) 11/02/2013   10/03/2015, 11/13/2015, 08/03/2017   Benign essential HTN 11/28/2013   Bipolar disease, chronic ([place])    Chest pain    a. 2012 Myoview: EF 63%, no isch/infarct;  b. 04/2016 Lexiscan MV: EF 73%, no ischemia/infarct-->Low risk.   Chronic diastolic CHF (congestive heart failure) ([place]) 07/23/2011   a. 2015 Echo: EF 55-60%, Gr2 DD;  b. 09/2015 Echo: EF 60-65%, no rwma, mod dil LA, PASP 51mmHg.   Cor pulmonale (chronic) (HCC)    History of thyrotoxicosis    HTN (hypertension) 11/28/2013   Hypertensive heart disease 10/18/2013   Hypoglycemia    Insulin dependent type 2 diabetes mellitus, uncontrolled    Mediastinal adenopathy    Morbid obesity due to excess calories ([place]) 02/19/2011   Morbid obesity with BMI of 50.0-59.9, adult (HCC)    OSA (obstructive sleep apnea) 03/06/2011   Persistent atrial fibrillation ([place]) 12/09/2017   Pulmonary HTN, moderate to severe 11/03/2013   Sinusitis, chronic 01/02/2015   SVT (supraventricular tachycardia) ([place]) 12/06/2013   Uncontrolled type 2 diabetes mellitus with hyperglycemia (HCC)     Current Outpatient Medications  Medication Sig  Dispense Refill   carvedilol (COREG) 25 MG tablet Take 1 tablet (25 mg total) by mouth 2 (two) times daily with a meal. 60 tablet 0   losartan (COZAAR) 25 MG tablet Take 1 tablet (25 mg total) by mouth daily. Please fill as a 90 day supply 30 tablet 0   metFORMIN (GLUCOPHAGE) 1000 MG tablet Take 1,000 mg by mouth 2 (two) times daily with a meal.     rivaroxaban (XARELTO) 20 MG TABS tablet Take 1 tablet (20 mg total) by mouth daily with supper. 30 tablet 0   torsemide (DEMADEX) 20 MG tablet Take 2 tablets (40 mg total) by mouth 2 (two) times daily. 60 tablet 3   Accu-Chek Softclix Lancets lancets Use up to 4 times daily 100 each 0   amLODipine (NORVASC) 10 MG tablet Take 1 tablet (10 mg  total) by mouth daily. 30 tablet 0   Blood Glucose Monitoring Suppl (ACCU-CHEK GUIDE) w/Device KIT Use to check blood sugar three times daily. 1 kit 0   Blood Pressure Monitor DEVI Please provide patient with insurance approved blood pressure monitor I10.0 1 each 0   Dulaglutide (TRULICITY) 1.5 FA/2.1HY SOPN Inject 1.5 mg into the skin once a week. 6 mL 0   empagliflozin (JARDIANCE) 10 MG TABS tablet Take 1 tablet (10 mg total) by mouth daily before breakfast. 30 tablet 3   escitalopram (LEXAPRO) 20 MG tablet Take 1 tablet (20 mg total) by mouth every morning. 30 tablet 0   glucose blood (ACCU-CHEK GUIDE) test strip Use as directed up to 4 times daily 100 each 0   hydrALAZINE (APRESOLINE) 25 MG tablet Take 1 tablet (25 mg total) by mouth 3 (three) times daily. 90 tablet 3   Incontinence Supply Disposable (INCONTINENCE BRIEF LARGE) MISC Please provide patient with insurance approved incontinence supplies/briefs 18 each 6   Naphazoline-Pheniramine (EYE ALLERGY RELIEF OP) Place 1 drop into both eyes daily as needed (allergy).     risperiDONE (RISPERDAL) 2 MG tablet Take 1 tablet (2 mg total) by mouth at bedtime. 30 tablet 0   spironolactone (ALDACTONE) 25 MG tablet Take 1 tablet (25 mg total) by mouth daily. 30 tablet 0    No current facility-administered medications for this encounter.    Vitals:   06/10/22 1000  Pulse: 91  SpO2: 93%  Weight: (!) 143.1 kg (315 lb 6.4 oz)    Wt Readings from Last 3 Encounters:  06/10/22 (!) 143.1 kg (315 lb 6.4 oz)  06/03/22 (!) 142.9 kg (315 lb)  05/28/22 (!) 140.1 kg (308 lb 13.8 oz)    PHYSICAL EXAM: ReDs 40%  General:  anxious appearing, obese. No respiratory difficulty HEENT: normal Neck: supple. Short tick neck, JVD not well visualized. Carotids 2+ bilat; no bruits. No lymphadenopathy or thyromegaly appreciated. Cor: PMI nondisplaced. Irregularly irregular rhythm and rate. No rubs, gallops or murmurs. Lungs: clear Abdomen: obese, soft, nontender, nondistended. No hepatosplenomegaly. No bruits or masses. Good bowel sounds. Extremities: no cyanosis, clubbing, rash, edema Neuro: alert & oriented x 3, cranial nerves grossly intact. moves all 4 extremities w/o difficulty. Affect pleasant.   ASSESSMENT & PLAN:  1. Chronic Diastolic Heart Failure  - Echo 7/22 EF 60-65%, severe LVH, RV mildly dilated normal systolic function  - Echo 8/23 EF 60-65%, severe LVH, Septal thickenness over 64mm but difficult to measure, RV normal.  - Severe LVH may be secondary to HTN but will need cMRI to r/o infiltrative CM  - NYHA III. Confounded by obesity, deconditioning, OSA and probable OHS.  Volume overload on exam, ReDs 40%  - Add back Jardiance 10 mg daily  - Stop Losartan  - Start Entresto 24-26 mg bid  - Continue Spironolactone 25 mg daily  - Continue Torsemide 40 mg bid  - Check BMET and  BNP  today. Repeat BMP in 7 days  - dicussed importance of dietary restriction of sodium and limiting fluid intake + daily wts for fluid status monitoring  - refer to the [facility] for further management. Will continue w/u to exclude possible infiltrative CM (sarcoid, amyloidosis). Prior Chest CT in 2019 +Prominent mediastinal lymph nodes + calcified granulomas. Check multiple myeloma and  SPEP as part of amyloid w/u. cMRI per above. If suggestive of possible sarcoid, will need cardiac PET. May also need RHC and consideration for CardioMEMs. I discussed RHC w/ her today but she  currently adamantly refuses, citing prior cath complications. Will discuss again at future appointments.  - If she continues to have significant dyspnea despite improvement in volume status, will need referral to pulmonology. Suspect OHS. - Per assessment today, she seems to have a good understanding of her meds and how to take. We provided a pill box today and I stressed the importance of strict med compliance. Will need to follow closely. May need referral to Paramedicine if there are future issues w/ poor compliance.    2. Hypertension  - controlled today on current regimen - GDMT per above    3. Chronic Afib - Rate controlled. Unlikely to hold NSR w/ rhythm control given chronicity and non compliance w/ CPAP  - Continue coreg 25 mg twice a day.  - on xarelto  4. OSA - non compliant w/ CPAP  - stress importance of compliance. Says device dose not work. Needs to f/u w/ ordering provider Geryl Rankins NP) for help obtaining new CPAP    5. Morbid Obesity - Body mass index is 49.4 kg/m. - Discussed portion control.    6. DM II  - Continue Jardiance  - Needs follow up with PCP  7. Pleuritic Chest Pain  - w/ recent prolonged travel and ? Compliance w/ Xarelto, will check d-dimer. If elevated, will need chest CT to exclude PE  - suspect fluid overload in setting of diastolic dysfunction contributing. Diuretic titration per above - EKG non acute. Fairly recent [place] 2019 w/ normal cors. ? Small vessel disease. Consider trial of Ranexa next visit if symptoms continue. cMRI as above to r/o infiltrative CM.    NYHA II-III  GDMT  Diuretic- torsemide 40 mg bid  BB- Coreg 25 mg bid  Ace/ARB/ARNI Entresto 24-26 mg bid  MRA Spiro 25 mg daily  SGLT2i Jardiance 10 mg daily    F/u in the [facility] F/u w/  APP in 2 wks. Assign to Dr. [name] Links PA-C   10:07 AM

## 2022-06-10 NOTE — Progress Notes (Signed)
ReDS Vest / Clip - 06/10/22 1000       ReDS Vest / Clip   Station Marker D    Ruler Value 32    ReDS Value Range Moderate volume overload    ReDS Actual Value 40

## 2022-06-10 NOTE — Patient Instructions (Addendum)
Medication Changes:  The pharmacist has set up your pill box. Please take medications as prescribed.   STOP: Lostartan (cozaar)  START: Entresto 24-'26mg'$ . Take ONE tablet, TWO times a day.   START: Jardiance '10mg'$ . Take one tablet daily.   Lab Work:  Labs done today, your results will be available in MyChart, we will contact you for abnormal readings.  Testing/Procedures:  You are being scheduled for more lab work next week.   Your physician has requested that you have a cardiac MRI. Cardiac MRI uses a computer to create images of your heart as its beating, producing both still and moving pictures of your heart and major blood vessels. For further information please visit [email or web address] Please follow the instruction sheet given to you today for more information. The scheduler will call you to schedule your scan after approved by insurance.   PLEASE CONTACT YOUR MEDICAID TEAM TO RENEW YOUR FULL MEDICAID.   Referrals:  You have been referred to the [facility] to help with medication management of your heart failure. Please see below for follow up appointment plans.   Special Instructions // Education:  Do the following things EVERYDAY: Weigh yourself in the morning before breakfast. Write it down and keep it in a log. Take your medicines as prescribed Eat low salt foods--Limit salt (sodium) to 2000 mg per day.  Stay as active as you can everyday Limit all fluids for the day to less than 2 liters  Follow-Up in: SEE APPOINTMENTS ON NEXT PAGE.      At the [facility], you and your health needs are our priority. We have a designated team specialized in the treatment of Heart Failure. This Care Team includes your primary Heart Failure Specialized Cardiologist (physician), Advanced Practice Providers (APPs- Physician Assistants and Nurse Practitioners), and Pharmacist who all work together to provide you with the care you need, when you need it.    You may see any of the following providers on your designated Care Team at your next follow up:  Dr. Glori Bickers Dr. Loralie Champagne Dr. Roxana Hires, NP Lyda Jester, [place] [facility] [place], [place] Forestine Na, NP Audry Riles, PharmD   Please be sure to bring in all your medications bottles to every appointment.   Need to Contact us:  If you have any questions or concerns before your next appointment please send [place] a message through [place] or call our office at [phone].    TO LEAVE A MESSAGE FOR THE NURSE SELECT OPTION 2, PLEASE LEAVE A MESSAGE INCLUDING: YOUR NAME DATE OF BIRTH CALL BACK NUMBER REASON FOR CALL**this is important as we prioritize the call backs  YOU WILL RECEIVE A CALL BACK THE SAME DAY AS LONG AS YOU CALL BEFORE 4:00 PM

## 2022-06-12 ENCOUNTER — Telehealth (HOSPITAL_COMMUNITY): Payer: Self-pay | Admitting: Pharmacy Technician

## 2022-06-12 ENCOUNTER — Other Ambulatory Visit (HOSPITAL_COMMUNITY): Payer: Self-pay

## 2022-06-12 MED ORDER — ENTRESTO 24-26 MG PO TABS: 1.0000 | ORAL_TABLET | Freq: Two times a day (BID) | ORAL | 3 refills | Status: DC

## 2022-06-12 NOTE — Telephone Encounter (Signed)
Advanced Heart Failure Patient Advocate Encounter  TOC started Entresto and Xarelto applications as patient will be absorbed by the AHF clinic. Document scanned to chart. Sent in applications via fax.  Will follow up.

## 2022-06-15 LAB — MULTIPLE MYELOMA PANEL, SERUM
Albumin SerPl Elph-Mcnc: 3.2 g/dL (ref 2.9–4.4)
Albumin/Glob SerPl: 0.9 (ref 0.7–1.7)
Alpha 1: 0.2 g/dL (ref 0.0–0.4)
Alpha2 Glob SerPl Elph-Mcnc: 0.9 g/dL (ref 0.4–1.0)
B-Globulin SerPl Elph-Mcnc: 1.2 g/dL (ref 0.7–1.3)
Gamma Glob SerPl Elph-Mcnc: 1.4 g/dL (ref 0.4–1.8)
Globulin, Total: 3.8 g/dL (ref 2.2–3.9)
IgA: 412 mg/dL — ABNORMAL HIGH (ref 87–352)
IgG (Immunoglobin G), Serum: 1539 mg/dL (ref 586–1602)
IgM (Immunoglobulin M), Srm: 33 mg/dL (ref 26–217)
Total Protein ELP: 7 g/dL (ref 6.0–8.5)

## 2022-06-16 ENCOUNTER — Other Ambulatory Visit (HOSPITAL_COMMUNITY): Payer: Self-pay

## 2022-06-16 ENCOUNTER — Other Ambulatory Visit: Payer: Self-pay

## 2022-06-16 DIAGNOSIS — I110 Hypertensive heart disease with heart failure: Secondary | ICD-10-CM

## 2022-06-16 DIAGNOSIS — I4819 Other persistent atrial fibrillation: Secondary | ICD-10-CM

## 2022-06-16 MED ORDER — SPIRONOLACTONE 25 MG PO TABS
25.0000 mg | ORAL_TABLET | Freq: Every day | ORAL | 0 refills | Status: DC
  Filled 2022-06-16 – 2022-06-17 (×2): qty 30, 30d supply, fill #0

## 2022-06-16 MED ORDER — CARVEDILOL 25 MG PO TABS
25.0000 mg | ORAL_TABLET | Freq: Two times a day (BID) | ORAL | 0 refills | Status: DC
  Filled 2022-06-16 – 2022-06-17 (×2): qty 60, 30d supply, fill #0

## 2022-06-16 MED ORDER — RIVAROXABAN 20 MG PO TABS
20.0000 mg | ORAL_TABLET | Freq: Every day | ORAL | 0 refills | Status: DC
  Filled 2022-06-16: qty 30, 30d supply, fill #0

## 2022-06-16 MED ORDER — AMLODIPINE BESYLATE 10 MG PO TABS
10.0000 mg | ORAL_TABLET | Freq: Every day | ORAL | 0 refills | Status: DC
  Filled 2022-06-16 – 2022-06-17 (×2): qty 30, 30d supply, fill #0

## 2022-06-16 NOTE — Telephone Encounter (Signed)
Advanced Heart Failure Patient Advocate Encounter   Patient was approved to receive assistance for XARELTO from [employer] Effective dates: 06/15/2022 through 06/16/2023 Billing information added to [name]: [id number] ID: [id number]  Entresto assistance is requesting POI.  Contacted patient on the phone and informed of Xarelto coverage, also provided the number for [employer]. Pt understands she needs to call for an Income Naval architect for this program.  Clista Bernhardt, CPhT Rx Patient Advocate Phone: [phone]

## 2022-06-17 ENCOUNTER — Other Ambulatory Visit: Payer: Self-pay

## 2022-06-17 ENCOUNTER — Ambulatory Visit (HOSPITAL_COMMUNITY)
Admission: RE | Admit: 2022-06-17 | Discharge: 2022-06-17 | Disposition: A | Discharge status: Home / Self Care | Payer: Medicaid Other | Source: Ambulatory Visit | Attending: Cardiology | Admitting: Cardiology

## 2022-06-17 DIAGNOSIS — I5032 Chronic diastolic (congestive) heart failure: Secondary | ICD-10-CM

## 2022-06-17 LAB — BASIC METABOLIC PANEL
Anion gap: 9 (ref 5–15)
BUN: 23 mg/dL — ABNORMAL HIGH (ref 6–20)
CO2: 28 mmol/L (ref 22–32)
Calcium: 9.4 mg/dL (ref 8.9–10.3)
Chloride: 103 mmol/L (ref 98–111)
Creatinine, Ser: 1.56 mg/dL — ABNORMAL HIGH (ref 0.44–1.00)
GFR, Estimated: 39 mL/min — ABNORMAL LOW (ref >60)
Glucose, Bld: 174 mg/dL — ABNORMAL HIGH (ref 70–99)
Potassium: 4.4 mmol/L (ref 3.5–5.1)
Sodium: 140 mmol/L (ref 135–145)

## 2022-06-17 NOTE — Progress Notes (Signed)
Cardiology Clinic Note   Patient Name: [name] Date of Encounter: 06/19/2022  Primary Care Provider:  [name], NP Primary Cardiologist:  [name], MD  Patient Profile    [name] 56 year old female presents today for follow-up of her chronic diastolic CHF.  Past Medical History    Past Medical History:  Diagnosis Date   Acute on chronic diastolic congestive heart failure ([place]) 11/02/2013   10/03/2015, 11/13/2015, 08/03/2017   Benign essential HTN 11/28/2013   Bipolar disease, chronic ([place])    Chest pain    a. 2012 Myoview: EF 63%, no isch/infarct;  b. 04/2016 Lexiscan MV: EF 73%, no ischemia/infarct-->Low risk.   Chronic diastolic CHF (congestive heart failure) ([place]) 07/23/2011   a. 2015 Echo: EF 55-60%, Gr2 DD;  b. 09/2015 Echo: EF 60-65%, no rwma, mod dil LA, PASP 24mHg.   Cor pulmonale (chronic) (HCC)    History of thyrotoxicosis    HTN (hypertension) 11/28/2013   Hypertensive heart disease 10/18/2013   Hypoglycemia    Insulin dependent type 2 diabetes mellitus, uncontrolled    Mediastinal adenopathy    Morbid obesity due to excess calories ([facility] 02/19/2011   Morbid obesity with BMI of 50.0-59.9, adult (HCC)    OSA (obstructive sleep apnea) 03/06/2011   Persistent atrial fibrillation ([place] 12/09/2017   Pulmonary HTN, moderate to severe 11/03/2013   Sinusitis, chronic 01/02/2015   SVT (supraventricular tachycardia) ([facility] 12/06/2013   Uncontrolled type 2 diabetes mellitus with hyperglycemia ([facility]    Past Surgical History:  Procedure Laterality Date   CARDIOVERSION N/A 04/05/2018   Procedure: CARDIOVERSION;  Surgeon: CLelon Perla MD;  [facility]  Service: Cardiovascular;  Laterality: N/A;   COLONOSCOPY WITH PROPOFOL Left 07/16/2018   Procedure: COLONOSCOPY WITH PROPOFOL;  Surgeon: KRonnette Juniper MD;  [facility];  Service: Gastroenterology;  Laterality: Left;   LEFT HEART CATH AND CORONARY ANGIOGRAPHY N/A 08/04/2018   Procedure:  LEFT HEART CATH AND CORONARY ANGIOGRAPHY;  Surgeon: SBelva Crome MD;  [facility];  Service: Cardiovascular;  Laterality: N/A;   None     POLYPECTOMY  07/16/2018   Procedure: POLYPECTOMY;  Surgeon: KRonnette Juniper MD;  [facility];  Service: Gastroenterology;;    Allergies  Allergies  Allergen Reactions   Acetaminophen Other (See Comments)    Seizure-like "fits" as a child   Caffeine     Tense, anxiety, increased urination   Farxiga [Dapagliflozin] Other (See Comments)    Hallucinations, drop in blood sugar   Lisinopril Rash    Rash with lisinopril; but fosinopril is ok per patient    History of Present Illness    Ms. KNohas a PMH of chronic diastolic heart failure, NSTEMI, pulmonary hypertension moderate to severe, benign essential hypertension, cor pulmonale, persistent atrial fibrillation, OSA, interstitial lung disease, type 2 diabetes, acute kidney injury, bipolar disorder, and morbid obesity.  She also has a history of SVT.  Her echocardiogram 7/19 showed normal LV function, mild mitral regurgitation, mild bilateral enlargement and moderate tricuspid regurgitation.  She was admitted with recurrent chest pain 11/19.  Cardiac enzymes were positive.  A follow-up limited study 11/19 showed normal LV function and biatrial enlargement.  A CTA showed no pulmonary embolus.  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  Ejection fraction was 65%.  Her LVEDP was 26 mmHg.  The procedure was complicated by ventricular fibrillation requiring defibrillation.  This was successful and resulted in sinus rhythm.  She  developed contrast neuropathy post procedure.  She was last seen by Dr. [name] March 14, 2019.  During that time she denied dyspnea.  States she had occasional chest pain that was relieved by rubbing ointment on her chest.  She was also noted to have bilateral lower extremity edema and denied syncope.  She was in  atrial fibrillation at that time, her beta-blocker was continued as well as her Xarelto.   She recently presented to the emergency department on 12/30/2019 with atypical chest pain.  She was ruled out for MI, troponins negative x2, D-dimer not elevated, CXR showed fluid volume overload.  Her furosemide was switched to torsemide and she was instructed to follow-up with her PCP and cardiology.   She presented to the clinic 01/04/20 and stated she did not take her torsemide medication because she was worried about switching from her furosemide.  Her weight was 344 pounds.  She stated she did have dyspnea with activity.  She had been trying to restrict her fluids and had been drinking around 120 mL a day which is down from over 3 L.  She also stated she did not like to wear lower extremity support stockings.  I  explained the benefits of switching medication to her and her brother.  She was very anxious about medication change but understood the risks of hospital admission and after much discussion has decided to switch to torsemide rather than return to the hospital.  I  had her take torsemide 80 mg twice daily for 1 week, have a BMP in 1 week, give her the salty 6 diet, further restrict her fluids to 32-48 ounces daily and repeat a BMP in 1 week.   She presented to the clinic 01/11/20 for follow-up and stated she was starting to feel better this week.  She had lost almost 6 pounds.  She stated that she had not been very active in the last week.  She was spending a lot of time in bed going back and forth to the bathroom.     She presented the clinic 01/26/2020 for follow-up and stated she felt about the same as she did during her prior visit.  She stated that her breathing was not worse.  She stated that with Ramadan she had skipped some of her morning doses of torsemide.  She asked if she needed to fast while we are managing her medical problems.  I had instructed her that she should not miss doses of her diuretic.   Her creatinine was slightly elevated with last draw and her dosing was decreased to 60 mg twice daily.  I  continued this dose, checked a BMP , gave her the salty 6,  reinforced fluid restriction, and planned follow-up in 1 month.  Creatinine 1.15 on 01/26/2020.   She presented to the clinic 02/27/2020 for follow-up evaluation and stated she climbed the stairs to the office .  She felt that she was doing somewhat better with her lower extremity edema.  She felt that her breathing was much better as well.  She stated that her brother passed away in Feb 19, 2023 and she had been having a hard time grieving the loss.  She continued to consume around 120 mL of fluid daily.  We discussed fluid restriction and the benefit of not consuming as much fluid.  I  gave her strategies for not drinking as much using sugar-free gum sugar-free candy instead of drinking fluid.    She was seen by Kerin Ransom, PA-C on 09/17/2020.  She continued with chronic DOE which was unchanged.  She reported incontinence of stool but attributed it to metformin.  She was seen in follow-up by Ellen Henri PA-C on 06/10/2022.  She had been admitted to the hospital 05/25/2022 with acute on chronic HFpEF.  She ran out of medications while traveling out of [place].  She received IV diuresis and was transitioned to torsemide 40 mg twice daily.  Her echocardiogram showed an LV EF of 60 to 65%, severe LVH, septal thickness over 20 mm which was difficult to measure, and normal RV function.  Her discharge weight was 308 pounds.  Jardiance 10 mg daily was added back to her medication regimen.  Losartan was discontinued.  She was started on Entresto 24-26 twice daily, her spironolactone was continued.  She was continued on torsemide 40 mg twice daily.  Plan for follow-up edema and BMP with repeat BMP in 7 days.  She was educated about sodium restriction, limiting fluids, and daily weights.  She was referred to advanced heart failure clinic for further management.   She was also referred to pulmonology for her dyspnea.  BMP showed potassium of 4.4, glucose 174, creatinine 1.56 (baseline creatinine 1.12-1.13).  She presents to the clinic today for follow-up evaluation states she feels well.  She has been taking her torsemide in the morning and at night.  She reports that she does not sleep well.  She is getting up several times through the night to use the bathroom.  Her weight today is 305 pounds.  She reports that she does not have a formal exercise routine.  She has been weighing herself regularly.  She has been following a more strict diet and has been avoiding simple carbohydrates.  I encouraged her to continue her fluid restriction, weigh daily, take her furosemide in the early afternoon, and move around more.  We will plan follow-up in 4-6 months.   Today she denies chest pain,  lower extremity edema, palpitations, melena, hematuria, hemoptysis, diaphoresis, weakness, presyncope, syncope,  and PND.   Home Medications    Prior to Admission medications   Medication Sig Start Date End Date Taking? Authorizing Provider  Accu-Chek Softclix Lancets lancets Use up to 4 times daily 05/28/22   Domenic Polite, MD  amLODipine (NORVASC) 10 MG tablet Take 1 tablet (10 mg total) by mouth daily. 06/16/22   Larey Dresser, MD  Blood Glucose Monitoring Suppl (ACCU-CHEK GUIDE) w/Device KIT Use to check blood sugar three times daily. 05/28/22   Domenic Polite, MD  Blood Pressure Monitor DEVI Please provide patient with insurance approved blood pressure monitor I10.0 07/21/20   [name], NP  carvedilol (COREG) 25 MG tablet Take 1 tablet (25 mg total) by mouth 2 (two) times daily with a meal. 06/16/22   Larey Dresser, MD  Dulaglutide 1.5 MG/0.5ML SOPN INJECT 1.5 MG INTO THE SKIN ONCE A WEEK. 11/19/21 07/15/22  Charlott Rakes, MD  empagliflozin (JARDIANCE) 10 MG TABS tablet Take 1 tablet (10 mg total) by mouth daily before breakfast. 06/10/22   Lyda Jester M,  PA-C  escitalopram (LEXAPRO) 20 MG tablet Take 1 tablet (20 mg total) by mouth every morning. 05/28/22 08/26/22  Domenic Polite, MD  glipiZIDE (GLUCOTROL XL) 10 MG 24 hr tablet Take 10 mg by mouth 2 (two) times daily.    [provider]  glucose blood (ACCU-CHEK GUIDE) test strip Use as directed up to 4 times daily 05/28/22   Domenic Polite, MD  hydrALAZINE (APRESOLINE) 25 MG tablet  Take 1 tablet (25 mg total) by mouth 3 (three) times daily. 06/03/22 09/01/22  Conrad [place], NP  Incontinence Supply Disposable (INCONTINENCE BRIEF LARGE) MISC Please provide patient with insurance approved incontinence supplies/briefs 07/02/20   [name], NP  metFORMIN (GLUCOPHAGE) 1000 MG tablet Take 1,000 mg by mouth 2 (two) times daily with a meal.    [provider]  Naphazoline-Pheniramine (EYE ALLERGY RELIEF OP) Place 1 drop into both eyes daily as needed (allergy).    [provider]  risperiDONE (RISPERDAL) 2 MG tablet Take 1 tablet (2 mg total) by mouth at bedtime. 05/28/22 06/27/22  Domenic Polite, MD  rivaroxaban (XARELTO) 20 MG TABS tablet Take 1 tablet (20 mg total) by mouth daily with supper. 06/16/22 09/14/22  Larey Dresser, MD  sacubitril-valsartan (ENTRESTO) 24-26 MG Take 1 tablet by mouth 2 (two) times daily. 06/12/22   Larey Dresser, MD  spironolactone (ALDACTONE) 25 MG tablet Take 1 tablet (25 mg total) by mouth daily. 06/16/22   Larey Dresser, MD  torsemide (DEMADEX) 20 MG tablet Take 2 tablets (40 mg total) by mouth 2 (two) times daily. 06/03/22 09/01/22  Clegg, Amy D, NP  furosemide (LASIX) 80 MG tablet Take 1 tablet (80 mg total) by mouth in the morning, at noon, and at bedtime. 12/19/19 12/30/19  Allie Bossier, MD  sitaGLIPtin (JANUVIA) 100 MG tablet Take 1 tablet (100 mg total) by mouth daily. 10/02/20 10/24/20  [name], NP    Family History    Family History  Problem Relation Age of Onset   Heart failure Father    Stroke Father    Hypertension Mother     Heart disease Maternal Grandfather    She indicated that her mother is alive. She indicated that her father is deceased. She indicated that the status of her brother is unknown and reported the following: HTN. She indicated that her maternal grandmother is deceased. She indicated that her maternal grandfather is deceased. She indicated that her paternal grandmother is deceased. She indicated that her paternal grandfather is deceased.  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 But ran out of money and is no longer attending school. (Note patient has bipolar disorder).      Born in [place] but lived in [place] before coming back to [place].       Primary language is Arabic. Lives with mother and brother.   Social Determinants of Health   Financial Resource Strain: Low Risk  (05/26/2022)   Overall Financial Resource Strain (CARDIA)    Difficulty of Paying Living Expenses: Not very hard  Food Insecurity: No Food Insecurity (05/26/2022)   Hunger Vital Sign    Worried About Running Out of Food in the  Last Year: Never true    [place] in the Last Year: Never true  Transportation Needs: No Transportation Needs (05/26/2022)   PRAPARE - Hydrologist (Medical): No    Lack of Transportation (Non-Medical): No  Physical Activity: Not on file  Stress:  No Stress Concern Present (07/15/2018)   [place]    Feeling of Stress : Only a little  Social Connections: Not on file  Intimate Partner Violence: Not on file     Review of Systems    General:  No chills, fever, night sweats or weight changes.  Cardiovascular:  No chest pain, dyspnea on exertion, edema, orthopnea, palpitations, paroxysmal nocturnal dyspnea. Dermatological: No rash, lesions/masses Respiratory: No cough, dyspnea Urologic: No hematuria, dysuria Abdominal:   No nausea, vomiting, diarrhea, bright red blood per rectum, melena, or hematemesis Neurologic:  No visual changes, wkns, changes in mental status. All other systems reviewed and are otherwise negative except as noted above.  Physical Exam    VS:  BP 110/72 (BP Location: Right Arm, Patient Position: Sitting, Cuff Size: Large)   Pulse 92   Ht '5\' 7"'  (1.702 m)   Wt (!) 305 lb 3.2 oz (138.4 kg)   SpO2 96%   BMI 47.80 kg/m  , BMI Body mass index is 47.8 kg/m. GEN: Well nourished, well developed, in no acute distress. HEENT: normal. Neck: Supple, no JVD, carotid bruits, or masses. Cardiac: RRR, no murmurs, rubs, or gallops. No clubbing, cyanosis, edema.  Radials/DP/PT 2+ and equal bilaterally.  Respiratory:  Respirations regular and unlabored, clear to auscultation bilaterally. GI: Soft, nontender, nondistended, BS + x 4. MS: no deformity or atrophy. Skin: warm and dry, no rash. Neuro:  Strength and sensation are intact. Psych: Normal affect.  Accessory Clinical Findings    Recent Labs: 11/10/2021: TSH 5.523 05/25/2022: ALT 10; Hemoglobin 13.9; Platelets 206 05/27/2022: Magnesium 2.0 06/10/2022: B Natriuretic Peptide 337.6 06/17/2022: BUN 23; Creatinine, Ser 1.56; Potassium 4.4; Sodium 140   Recent Lipid Panel    Component Value Date/Time   CHOL 129 10/02/2020 1511   TRIG 214 (H) 10/02/2020 1511   HDL 43 10/02/2020 1511   CHOLHDL 3.0  10/02/2020 1511   CHOLHDL 2.9 12/16/2019 0302   VLDL 11 12/16/2019 0302   LDLCALC 52 10/02/2020 1511         ECG personally reviewed by me today-none today.  EKG 01/01/2020 Atrial fibrillation 90 bpm   Echocardiogram 12/09/2019 IMPRESSIONS     1. Left ventricular ejection fraction, by estimation, is 65 to 70%. The  left ventricle has hyperdynamic function. The left ventricle has no  regional wall motion abnormalities. Left ventricular diastolic function  could not be evaluated. Left ventricular  diastolic function could not be evaluated.   2. Right ventricular systolic function is normal. The right ventricular  size is normal. There is moderately elevated pulmonary artery systolic  pressure. The estimated right ventricular systolic pressure is 82.9 mmHg.   3. Left atrial size was mildly dilated.   4. Right atrial size was mildly dilated.   5. The mitral valve is normal in structure. Mild mitral valve  regurgitation. No evidence of mitral stenosis.   6. The aortic valve is normal in structure. Aortic valve regurgitation is  not visualized. No aortic stenosis is present.   7. The inferior vena cava is normal in size with greater than 50%  respiratory variability, suggesting right atrial pressure of 3 mmHg.  Cardiac catheterization 08/04/2018 Widely patent coronary arteries. 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  Courses between PA and AO.  Mild dynamic compression is noted. Hypercontractile left ventricle.  Marked elevation in LVEDP, 26 mmHg.  EF greater than 65%. Ventricular fibrillation treated with defibrillation x1. Conversion to normal sinus rhythm after defibrillation.   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 Resume anticoagulation, likely Xarelto since coronaries are clean. Further management per treating team. Discontinue nitroglycerin IV.   Echocardiogram 05/26/2022  IMPRESSIONS      1. Left ventricular ejection fraction, by estimation, is 60 to 65%. The  left ventricle has normal function. The left ventricle has no regional  wall motion abnormalities. There is severe left ventricular hypertrophy.  Left ventricular diastolic parameters   are indeterminate. Septal thickenness over 70m but difficult to measure.   2. Right ventricular systolic function is normal. The right ventricular  size is normal. There is moderately elevated pulmonary artery systolic  pressure.   3. Left atrial size was severely dilated.   4. A small pericardial effusion is present.   5. The mitral valve is normal in structure. Trivial mitral valve  regurgitation. No evidence of mitral stenosis.   6. The aortic valve is tricuspid. Aortic valve regurgitation is not  visualized.   7. The inferior vena cava is dilated in size with <50% respiratory  variability, suggesting right atrial pressure of 15 mmHg.   Conclusion(s)/Recommendation(s): Consider CMR for the evaluation of severe  hypertrophy for evaluation of non obstructive hypertrophic cardiomyopathy.  Assessment & Plan   1.  Acute on chronic diastolic heart failure/atypical chest pain-Wt today 305.2lbs. Breathing stable. She was admitted to the hospital for acute on chronic CHF exacerbation.  She received IV diuresis and her discharge weight was 308 pounds.  [facility] pounds.  Continue torsemide, Entresto, Jardiance, spironolactone, hydralazine Heart healthy low-sodium diet-salty 6 reviewed Increase physical activity as tolerated-encouraged Daily weights Continue fluid restriction 64 oz daily or less. Lower extremity support stockings-reviewed Elevate extremities when not active    Chronic chest pain/anomalous origin of RCA-no chest pain today.  No recent episodes of chest discomfort.  Cardiac catheterization 11/19 showed normal coronary arteries Continue medical management    Persistent atrial fibrillation-heart rate today 92.  Denies bleeding issues. Continue Xarelto 20 mg daily, carvedilol Avoid triggers caffeine, chocolate, EtOH, etc.  Essential hypertension-BP today  110/72.  Well-controlled at home. Continue hydralazine, amlodipine, carvedilol Heart healthy low-sodium diet-salty 6 given Increase physical activity as tolerated   Morbid obesity-weight today 305 pounds down from 344 pounds 01/04/20   Heart healthy low-sodium diet Increase physical activity as tolerated-encouraged walking Continue weight loss   Disposition: Follow-up with  Dr. CStanford Breedin 4-6 months.  Follow-up with advanced heart failure clinic.   JJossie Ng Latrica Clowers NP-C     06/19/2022, 11:37 AM [place] Office ([phone] ([phone] Notice: This dictation was prepared with Dragon dictation along with smaller phrase technology. Any transcriptional errors that result from this process are unintentional and may not be corrected upon review.  I spent 14 minutes examining this patient, reviewing medications, and using patient centered shared decision making involving her cardiac care.  Prior to her visit I spent greater than 20 minutes reviewing her past medical history,  medications, and prior cardiac tests.

## 2022-06-19 ENCOUNTER — Encounter: Payer: Self-pay | Admitting: General Practice

## 2022-06-19 ENCOUNTER — Ambulatory Visit: Payer: Medicaid Other | Attending: General Practice | Admitting: General Practice

## 2022-06-19 VITALS — BP 110/72 | HR 92 | Ht 67.0 in | Wt 305.2 lb

## 2022-06-19 DIAGNOSIS — I10 Essential (primary) hypertension: Secondary | ICD-10-CM

## 2022-06-19 DIAGNOSIS — I4819 Other persistent atrial fibrillation: Secondary | ICD-10-CM

## 2022-06-19 DIAGNOSIS — I5032 Chronic diastolic (congestive) heart failure: Secondary | ICD-10-CM

## 2022-06-19 DIAGNOSIS — G8929 Other chronic pain: Secondary | ICD-10-CM

## 2022-06-19 DIAGNOSIS — R079 Chest pain, unspecified: Secondary | ICD-10-CM

## 2022-06-19 NOTE — Patient Instructions (Signed)
Medication Instructions:  TAKE TORSEMIDE EARLY AFTERNOON  *If you need a refill on your cardiac medications before your next appointment, please call your pharmacy*  Lab Work: NONE If you have labs (blood work) drawn today and your tests are completely normal, you will receive your results only by:  [place] (if you have MyChart) ORA paper copy in the mail If you have any lab test that is abnormal or we need to change your treatment, we will call you to review the results.  Follow-Up: At [facility], you and your health needs are our priority.  As part of our continuing mission to provide you with exceptional heart care, we have created designated Provider Care Teams.  These Care Teams include your primary Cardiologist (physician) and Advanced Practice Providers (APPs -  Physician Assistants and Nurse Practitioners) who all work together to provide you with the care you need, when you need it.  Your next appointment:   6 month(s)  The format for your next appointment:   In Person  Provider:   [name], MD   FOLLOW UP WITH HEART FAILURE AS SCHEDULED Other Instructions PLEASE READ AND FOLLOW ATTACHED  SALTY 6  WALK MORE-STAR 10 MINUTES/DAY  TAKE AND LOG YOUR WEIGHT DAILY-CALL HEART FAILURE WHEN WEIGHT IS >3/DAY OR 5/WEEKLY  Important Information About Sugar

## 2022-06-22 ENCOUNTER — Other Ambulatory Visit: Payer: Self-pay

## 2022-06-22 ENCOUNTER — Ambulatory Visit: Payer: Self-pay | Attending: Nurse Practitioner | Admitting: Nurse Practitioner

## 2022-06-22 ENCOUNTER — Encounter: Payer: Self-pay | Admitting: Nurse Practitioner

## 2022-06-22 VITALS — BP 131/84 | HR 78 | Temp 98.1°F | Ht 67.0 in | Wt 309.2 lb

## 2022-06-22 DIAGNOSIS — I4819 Other persistent atrial fibrillation: Secondary | ICD-10-CM

## 2022-06-22 DIAGNOSIS — Z09 Encounter for follow-up examination after completed treatment for conditions other than malignant neoplasm: Secondary | ICD-10-CM

## 2022-06-22 DIAGNOSIS — E1165 Type 2 diabetes mellitus with hyperglycemia: Secondary | ICD-10-CM

## 2022-06-22 DIAGNOSIS — I110 Hypertensive heart disease with heart failure: Secondary | ICD-10-CM

## 2022-06-22 DIAGNOSIS — L299 Pruritus, unspecified: Secondary | ICD-10-CM

## 2022-06-22 DIAGNOSIS — I5032 Chronic diastolic (congestive) heart failure: Secondary | ICD-10-CM

## 2022-06-22 DIAGNOSIS — Z1231 Encounter for screening mammogram for malignant neoplasm of breast: Secondary | ICD-10-CM

## 2022-06-22 DIAGNOSIS — R0981 Nasal congestion: Secondary | ICD-10-CM

## 2022-06-22 MED ORDER — CARVEDILOL 25 MG PO TABS
25.0000 mg | ORAL_TABLET | Freq: Two times a day (BID) | ORAL | 0 refills | Status: DC
  Filled 2022-06-22: qty 180, 90d supply, fill #0

## 2022-06-22 MED ORDER — FLUTICASONE PROPIONATE 50 MCG/ACT NA SUSP
2.0000 | Freq: Every day | NASAL | 6 refills | Status: DC
  Filled 2022-06-22: qty 16, 30d supply, fill #0

## 2022-06-22 MED ORDER — AMLODIPINE BESYLATE 10 MG PO TABS
10.0000 mg | ORAL_TABLET | Freq: Every day | ORAL | 1 refills | Status: DC
  Filled 2022-06-22: qty 90, 90d supply, fill #0

## 2022-06-22 MED ORDER — EMPAGLIFLOZIN 10 MG PO TABS
10.0000 mg | ORAL_TABLET | Freq: Every day | ORAL | 1 refills | Status: DC
  Filled 2022-06-22: qty 90, 90d supply, fill #0

## 2022-06-22 MED ORDER — DULAGLUTIDE 1.5 MG/0.5ML ~~LOC~~ SOAJ
1.5000 mg | SUBCUTANEOUS | 6 refills | Status: DC
  Filled 2022-06-22: qty 6, 84d supply, fill #0

## 2022-06-22 MED ORDER — HYDROXYZINE PAMOATE 25 MG PO CAPS
25.0000 mg | ORAL_CAPSULE | Freq: Three times a day (TID) | ORAL | 1 refills | Status: DC | PRN
  Filled 2022-06-22: qty 60, 20d supply, fill #0
  Filled 2022-08-06 – 2022-09-24 (×2): qty 60, 20d supply, fill #1

## 2022-06-22 NOTE — Progress Notes (Signed)
Assessment & Plan:  [name] was seen today for hospitalization follow-up.  Diagnoses and all orders for this visit:  Hospital discharge follow-up  Breast cancer screening by mammogram -     MM 3D SCREEN BREAST BILATERAL; Future  Uncontrolled type 2 diabetes mellitus with hyperglycemia (HCC) -     Discontinue: empagliflozin (JARDIANCE) 10 MG TABS tablet; Take 1 tablet (10 mg total) by mouth daily before breakfast. -     Basic metabolic panel -     empagliflozin (JARDIANCE) 10 MG TABS tablet; Take 1 tablet (10 mg total) by mouth daily before breakfast. -     Dulaglutide 1.5 MG/0.5ML SOPN; INJECT 1.5 MG INTO THE SKIN ONCE A WEEK.  Hypertensive heart disease with chronic diastolic congestive heart failure (HCC) -     amLODipine (NORVASC) 10 MG tablet; Take 1 tablet (10 mg total) by mouth daily. -     Discontinue: empagliflozin (JARDIANCE) 10 MG TABS tablet; Take 1 tablet (10 mg total) by mouth daily before breakfast. -     Basic metabolic panel -     empagliflozin (JARDIANCE) 10 MG TABS tablet; Take 1 tablet (10 mg total) by mouth daily before breakfast.  Persistent atrial fibrillation -     carvedilol (COREG) 25 MG tablet; Take 1 tablet (25 mg total) by mouth 2 (two) times daily with a meal.  Nasal congestion -     fluticasone (FLONASE) 50 MCG/ACT nasal spray; Place 2 sprays into both nostrils daily.  Pruritic condition -     hydrOXYzine (VISTARIL) 25 MG capsule; Take 1 capsule (25 mg total) by mouth every 8 (eight) hours as needed.    Patient has been counseled on age-appropriate routine health concerns for screening and prevention. These are reviewed and up-to-date. Referrals have been placed accordingly. Immunizations are up-to-date or declined.    Subjective:   Chief Complaint  Patient presents with   Hospitalization Follow-up   HPI [name] [age] y.o. female presents to office today for [name]. She is accompanied by her mother. Has concerns of nausea, nasal congestion,  loose stools and generalized pruritis.  States her loose stools are because she takes metformin however she declines for me to discontinue it.  PMH significant for:  AonC diastolic congestive heart failure (11/02/2013), Benign essential HTN (11/28/2013), Bipolar disease, chronic, Cor pulmonale, History of thyrotoxicosis, DM2,  Mediastinal adenopathy,  Morbid obesity with BMI >50, OSA (obstructive sleep apnea) DOES NOT USE BIPAP,   Persistent Afib (12/09/2017), Pulmonary HTN, moderate to severe (11/03/2013),  SVT (12/06/2013)  HFU She was admitted to the hospital August 28 and discharged in August 31.  Treated for acute on chronic diastolic CHF [place] running out of her medications 1 week prior to admission.  Chest x-ray noted for cardiomegaly, pulmonary vascular congestion.  Required IV Lasix and diuresed 4.8 L prior to discharge.  Losartan was dc'd and she was started on entresto and instructed to continue spironolactone. Due to hyperglycemia her glipizide was increased to 10 mg twice daily.  Diabetes is poorly controlled.  We will resume Jardiance today and will likely need to increase to 25 mg daily based on kidney function.  It does not appear she is taking Trulicity 1.5 mg weekly as prescribed. Notes generalized pruritus which started prior to being admitted to the hospital.  She was given hydroxyzine during her hospital admission and reports this worked well for her.  She has also taken hydroxyzine in the past.  She does report using a new  bath soap/detergent and I have instructed her that this may likely be contributing to her itching.  Will need to stop using and start using previous detergents and soaps.  Lab Results  Component Value Date   HGBA1C 10.5 (H) 05/25/2022       Has been Itching all over her body since she was discharged from the hospital on , nausea and diarrhea.  She has itching at.   BP Readings from Last 3 Encounters:  06/22/22 131/84  06/19/22 110/72   06/10/22 114/88    Review of Systems  Constitutional:  Negative for fever, malaise/fatigue and weight loss.  HENT:  Positive for congestion. Negative for nosebleeds.   Eyes: Negative.  Negative for blurred vision, double vision and photophobia.  Respiratory: Negative.  Negative for cough, shortness of breath and wheezing.   Cardiovascular: Negative.  Negative for chest pain, palpitations and leg swelling.  Gastrointestinal:  Positive for diarrhea. Negative for abdominal pain, blood in stool, constipation, heartburn, melena, nausea and vomiting.  Musculoskeletal: Negative.  Negative for myalgias.  Skin:  Positive for itching. Negative for rash.  Neurological: Negative.  Negative for dizziness, focal weakness, seizures and headaches.  Psychiatric/Behavioral: Negative.  Negative for suicidal ideas.     Past Medical History:  Diagnosis Date   Acute on chronic diastolic congestive heart failure ([place]) 11/02/2013   10/03/2015, 11/13/2015, 08/03/2017   Benign essential HTN 11/28/2013   Bipolar disease, chronic ([place])    Chest pain    a. 2012 Myoview: EF 63%, no isch/infarct;  b. 04/2016 Lexiscan MV: EF 73%, no ischemia/infarct-->Low risk.   Chronic diastolic CHF (congestive heart failure) ([place]) 07/23/2011   a. 2015 Echo: EF 55-60%, Gr2 DD;  b. 09/2015 Echo: EF 60-65%, no rwma, mod dil LA, PASP 43mHg.   Cor pulmonale (chronic) (HCC)    History of thyrotoxicosis    HTN (hypertension) 11/28/2013   Hypertensive heart disease 10/18/2013   Hypoglycemia    Insulin dependent type 2 diabetes mellitus, uncontrolled    Mediastinal adenopathy    Mor[place] to excess calories [place] 02/19/2011   Morbid obesity with BMI of 50.0-59.9, adult (HCC)    OSA (obstructive sleep apnea) 03/06/2011   Persistent atrial fibrillation ([facility] 12/09/2017   Pulmonary HTN, moderate to severe 11/03/2013   Sinusitis, chronic 01/02/2015   SVT (supra[place]ricular tachycardia) [place] 12/06/2013   Uncontrolled type 2 diabetes  mellitus with hyperglycemia ([facility]     Past Surgical History:  Procedure Laterality Date   CARDIOVERSION N/A 04/05/2018   Procedure: CARDIOVERSION;  Surgeon: CLelon Perla MD;  [facility]  Service: Cardiovascular;  Laterality: N/A;   COLONOSCOPY WITH PROPOFOL Left 07/16/2018   Procedure: COLONOSCOPY WITH PROPOFOL;  Surgeon: KRonnette Juniper MD;  Location: [facility];  Service: Gastroenterology;  Laterality: Left;   LEFT HEART CATH AND CORONARY ANGIOGRAPHY N/A 08/04/2018   Procedure: LEFT HEART CATH AND CORONARY ANGIOGRAPHY;  Surgeon: SBelva Crome MD;  [facility];  Service: Cardiovascular;  Laterality: N/A;   None     POLYPECTOMY  07/16/2018   Procedure: POLYPECTOMY;  Surgeon: KRonnette Juniper MD;  [facility];  Service: Gastroenterology;;    Family History  Problem Relation Age of Onset   Heart failure Father    Stroke Father    Hypertension Mother    Heart disease Maternal Grandfather     Social History Reviewed with no changes to be made today.   Outpatient Medications Prior to Visit  Medication Sig Dispense Refill  Accu-Chek Softclix Lancets lancets Use up to 4 times daily 100 each 0   Blood Glucose Monitoring Suppl (ACCU-CHEK GUIDE) w/Device KIT Use to check blood sugar three times daily. 1 kit 0   escitalopram (LEXAPRO) 20 MG tablet Take 1 tablet (20 mg total) by mouth every morning. 30 tablet 0   glipiZIDE (GLUCOTROL XL) 10 MG 24 hr tablet Take 10 mg by mouth 2 (two) times daily.     glucose blood (ACCU-CHEK GUIDE) test strip Use as directed up to 4 times daily 100 each 0   hydrALAZINE (APRESOLINE) 25 MG tablet Take 1 tablet (25 mg total) by mouth 3 (three) times daily. 90 tablet 3   Incontinence Supply Disposable (INCONTINENCE BRIEF LARGE) MISC Please provide patient with insurance approved incontinence supplies/briefs 18 each 6   metFORMIN (GLUCOPHAGE) 1000 MG tablet Take 1,000 mg by mouth 2 (two) times daily with a meal.      risperiDONE (RISPERDAL) 2 MG tablet Take 1 tablet (2 mg total) by mouth at bedtime. 30 tablet 0   rivaroxaban (XARELTO) 20 MG TABS tablet Take 1 tablet (20 mg total) by mouth daily with supper. 30 tablet 0   sacubitril-valsartan (ENTRESTO) 24-26 MG Take 1 tablet by mouth 2 (two) times daily. 180 tablet 3   Blood Pressure Monitor DEVI Please provide patient with insurance approved blood pressure monitor I10.0 (Patient not taking: Reported on 06/22/2022) 1 each 0   Naphazoline-Pheniramine (EYE ALLERGY RELIEF OP) Place 1 drop into both eyes daily as needed (allergy). (Patient not taking: Reported on 06/22/2022)     spironolactone (ALDACTONE) 25 MG tablet Take 1 tablet (25 mg total) by mouth daily. (Patient not taking: Reported on 06/22/2022) 30 tablet 0   torsemide (DEMADEX) 20 MG tablet Take 2 tablets (40 mg total) by mouth 2 (two) times daily. (Patient not taking: Reported on 06/22/2022) 60 tablet 3   amLODipine (NORVASC) 10 MG tablet Take 1 tablet (10 mg total) by mouth daily. (Patient not taking: Reported on 06/22/2022) 30 tablet 0   carvedilol (COREG) 25 MG tablet Take 1 tablet (25 mg total) by mouth 2 (two) times daily with a meal. (Patient not taking: Reported on 06/22/2022) 60 tablet 0   Dulaglutide 1.5 MG/0.5ML SOPN INJECT 1.5 MG INTO THE SKIN ONCE A WEEK. (Patient not taking: Reported on 06/22/2022) 6 mL 0   empagliflozin (JARDIANCE) 10 MG TABS tablet Take 1 tablet (10 mg total) by mouth daily before breakfast. (Patient not taking: Reported on 06/22/2022) 30 tablet 3   No facility-administered medications prior to visit.    Allergies  Allergen Reactions   Acetaminophen Other (See Comments)    Seizure-like "fits" as a child   Caffeine     Tense, anxiety, increased urination   [place] [Dapagliflozin] Other (See Comments)    Hallucinations, drop in blood sugar   Lisinopril Rash    Rash with lisinopril; but fosinopril is ok per patient       Objective:    BP 131/84   Pulse 78   Temp 98.1  F (36.7 C) (Oral)   Ht _0  (1.702 m)   Wt (!) 309 lb 3.2 oz (140.3 kg)   SpO2 97%   BMI 48.43 kg/m  Wt Readings from Last 3 Encounters:  06/22/22 (!) 309 lb 3.2 oz (140.3 kg)  06/19/22 (!) 305 lb 3.2 oz (138.4 kg)  06/10/22 (!) 315 lb 6.4 oz (143.1 kg)    Physical Exam Vitals and nursing note reviewed.  Constitutional:  Appearance: She is well-developed.  HENT:     Head: Normocephalic and atraumatic.  Cardiovascular:     Rate and Rhythm: Normal rate. Rhythm irregular.     Heart sounds: Normal heart sounds. No murmur heard.    No friction rub. No gallop.  Pulmonary:     Effort: Pulmonary effort is normal. No tachypnea or respiratory distress.     Breath sounds: Normal breath sounds. No decreased breath sounds, wheezing, rhonchi or rales.  Chest:     Chest wall: No tenderness.  Abdominal:     General: Bowel sounds are normal.     Palpations: Abdomen is soft.  Musculoskeletal:        General: Normal range of motion.     Cervical back: Normal range of motion.  Skin:    General: Skin is warm and dry.  Neurological:     Mental Status: She is alert and oriented to person, place, and time.     Coordination: Coordination normal.  Psychiatric:        Behavior: Behavior normal. Behavior is cooperative.        Thought Content: Thought content normal.        Judgment: Judgment normal.          Patient has been counseled extensively about nutrition and exercise as well as the importance of adherence with medications and regular follow-up. The patient was given clear instructions to go to ER or return to medical center if symptoms don't improve, worsen or new problems develop. The patient verbalized understanding.   Follow-up: Return in about 3 months (around 09/09/2022) for [place], FNP-BC [facility] and [facility] [place], [place]   06/22/2022, 4:44 PM

## 2022-06-23 LAB — BASIC METABOLIC PANEL
BUN/Creatinine Ratio: 26 — ABNORMAL HIGH (ref 9–23)
BUN: 36 mg/dL — ABNORMAL HIGH (ref 6–24)
CO2: 24 mmol/L (ref 20–29)
Calcium: 9.5 mg/dL (ref 8.7–10.2)
Chloride: 101 mmol/L (ref 96–106)
Creatinine, Ser: 1.36 mg/dL — ABNORMAL HIGH (ref 0.57–1.00)
Glucose: 75 mg/dL (ref 70–99)
Potassium: 4.2 mmol/L (ref 3.5–5.2)
Sodium: 141 mmol/L (ref 134–144)
eGFR: 46 mL/min/{1.73_m2} — ABNORMAL LOW (ref >59)

## 2022-06-24 ENCOUNTER — Ambulatory Visit (HOSPITAL_COMMUNITY)
Admission: RE | Admit: 2022-06-24 | Discharge: 2022-06-24 | Disposition: A | Discharge status: Home / Self Care | Payer: Medicaid Other | Source: Ambulatory Visit | Attending: Internal Medicine | Admitting: Internal Medicine

## 2022-06-24 ENCOUNTER — Other Ambulatory Visit (HOSPITAL_COMMUNITY): Payer: Self-pay

## 2022-06-24 ENCOUNTER — Other Ambulatory Visit: Payer: Self-pay

## 2022-06-24 DIAGNOSIS — I4819 Other persistent atrial fibrillation: Secondary | ICD-10-CM | POA: Insufficient documentation

## 2022-06-24 DIAGNOSIS — I110 Hypertensive heart disease with heart failure: Secondary | ICD-10-CM | POA: Insufficient documentation

## 2022-06-24 DIAGNOSIS — E1165 Type 2 diabetes mellitus with hyperglycemia: Secondary | ICD-10-CM | POA: Insufficient documentation

## 2022-06-24 DIAGNOSIS — I5032 Chronic diastolic (congestive) heart failure: Secondary | ICD-10-CM | POA: Insufficient documentation

## 2022-06-24 MED ORDER — EMPAGLIFLOZIN 10 MG PO TABS
10.0000 mg | ORAL_TABLET | Freq: Every day | ORAL | 3 refills | Status: DC
  Filled 2022-06-24: qty 30, 30d supply, fill #0

## 2022-06-24 MED ORDER — ENTRESTO 24-26 MG PO TABS: 1.0000 | ORAL_TABLET | Freq: Two times a day (BID) | ORAL | 3 refills | Status: DC

## 2022-06-24 MED ORDER — CARVEDILOL 25 MG PO TABS
25.0000 mg | ORAL_TABLET | Freq: Two times a day (BID) | ORAL | 3 refills | Status: DC
  Filled 2022-06-24 – 2022-08-06 (×2): qty 60, 30d supply, fill #0
  Filled 2022-11-10 – 2022-11-17 (×2): qty 60, 30d supply, fill #1
  Filled 2023-01-11: qty 60, 30d supply, fill #2
  Filled 2023-02-26: qty 60, 30d supply, fill #3

## 2022-06-24 MED ORDER — RIVAROXABAN 20 MG PO TABS
20.0000 mg | ORAL_TABLET | Freq: Every day | ORAL | 3 refills | Status: DC
  Filled 2022-06-24: qty 30, 30d supply, fill #0
  Filled 2022-07-23: qty 30, 30d supply, fill #1
  Filled 2022-09-04: qty 30, 30d supply, fill #2

## 2022-06-24 MED ORDER — AMLODIPINE BESYLATE 10 MG PO TABS
10.0000 mg | ORAL_TABLET | Freq: Every day | ORAL | 3 refills | Status: DC
  Filled 2022-06-24: qty 30, 30d supply, fill #0

## 2022-06-24 MED ORDER — HYDRALAZINE HCL 25 MG PO TABS
25.0000 mg | ORAL_TABLET | Freq: Three times a day (TID) | ORAL | 3 refills | Status: DC
  Filled 2022-06-24: qty 90, 30d supply, fill #0
  Filled 2022-08-06: qty 90, 30d supply, fill #1

## 2022-06-24 MED ORDER — SPIRONOLACTONE 25 MG PO TABS
25.0000 mg | ORAL_TABLET | Freq: Every day | ORAL | 3 refills | Status: DC
  Filled 2022-06-24: qty 30, 30d supply, fill #0
  Filled 2022-07-23: qty 30, 30d supply, fill #1
  Filled 2022-09-04: qty 30, 30d supply, fill #2

## 2022-06-24 MED ORDER — TORSEMIDE 20 MG PO TABS
40.0000 mg | ORAL_TABLET | Freq: Two times a day (BID) | ORAL | 0 refills | Status: DC
  Filled 2022-06-24: qty 120, 30d supply, fill #0

## 2022-06-24 NOTE — Progress Notes (Signed)
Set up medication box for the next week. Mother and patient at appt. Pt did not have enough spironolactone or torsemide to complete the whole week. Pt is now out of most medications. Bundled metformin, glipizide, lexapro, and Risperdal together as PCP has to refill these medications.   HF clinic [facility]. Pt has follow up appointment 10/2 at [place]. Will refill pill box then too. Pt and mother unable to follow directions/fill box correctly in the past which has led to hospital readmission d/t non-compliance.   Reminded patient/mother to fax tax information needed to [employer] for patient assistance on [place]. They have documentation from [employer] with information requested and fax number.

## 2022-06-24 NOTE — Patient Instructions (Signed)
Take your medications as prescribed.   Have your PCP refill your diabetes medications, lexapro and risperdal. Call for refill requests this week.   You [facility]. Pick those up this week as you are COMPLETELY out of your spironolactone. You will not have enough medications to refill your box next week.   Next appt 10/2, bring all medications and pill box.

## 2022-06-29 ENCOUNTER — Other Ambulatory Visit: Payer: Self-pay | Admitting: Nurse Practitioner

## 2022-06-29 ENCOUNTER — Other Ambulatory Visit (HOSPITAL_COMMUNITY): Payer: Self-pay

## 2022-06-29 ENCOUNTER — Other Ambulatory Visit: Payer: Self-pay

## 2022-06-29 ENCOUNTER — Ambulatory Visit (HOSPITAL_COMMUNITY)
Admission: RE | Admit: 2022-06-29 | Discharge: 2022-06-29 | Disposition: A | Discharge status: Home / Self Care | Payer: Medicaid Other | Source: Ambulatory Visit | Attending: Cardiology | Admitting: Cardiology

## 2022-06-29 ENCOUNTER — Telehealth: Payer: Self-pay | Admitting: Nurse Practitioner

## 2022-06-29 ENCOUNTER — Encounter (HOSPITAL_COMMUNITY): Payer: Self-pay

## 2022-06-29 VITALS — BP 120/78 | HR 83 | Wt 320.0 lb

## 2022-06-29 DIAGNOSIS — G4733 Obstructive sleep apnea (adult) (pediatric): Secondary | ICD-10-CM

## 2022-06-29 DIAGNOSIS — I5032 Chronic diastolic (congestive) heart failure: Secondary | ICD-10-CM | POA: Insufficient documentation

## 2022-06-29 DIAGNOSIS — I110 Hypertensive heart disease with heart failure: Secondary | ICD-10-CM | POA: Insufficient documentation

## 2022-06-29 DIAGNOSIS — R072 Precordial pain: Secondary | ICD-10-CM

## 2022-06-29 DIAGNOSIS — I10 Essential (primary) hypertension: Secondary | ICD-10-CM

## 2022-06-29 DIAGNOSIS — Z7901 Long term (current) use of anticoagulants: Secondary | ICD-10-CM | POA: Insufficient documentation

## 2022-06-29 DIAGNOSIS — R0789 Other chest pain: Secondary | ICD-10-CM | POA: Insufficient documentation

## 2022-06-29 DIAGNOSIS — Z79899 Other long term (current) drug therapy: Secondary | ICD-10-CM | POA: Insufficient documentation

## 2022-06-29 DIAGNOSIS — E1165 Type 2 diabetes mellitus with hyperglycemia: Secondary | ICD-10-CM

## 2022-06-29 DIAGNOSIS — Z6841 Body Mass Index (BMI) 40.0 and over, adult: Secondary | ICD-10-CM | POA: Insufficient documentation

## 2022-06-29 DIAGNOSIS — I482 Chronic atrial fibrillation, unspecified: Secondary | ICD-10-CM | POA: Insufficient documentation

## 2022-06-29 DIAGNOSIS — E119 Type 2 diabetes mellitus without complications: Secondary | ICD-10-CM | POA: Insufficient documentation

## 2022-06-29 LAB — BASIC METABOLIC PANEL
Anion gap: 11 (ref 5–15)
BUN: 36 mg/dL — ABNORMAL HIGH (ref 6–20)
CO2: 24 mmol/L (ref 22–32)
Calcium: 9.2 mg/dL (ref 8.9–10.3)
Chloride: 105 mmol/L (ref 98–111)
Creatinine, Ser: 1.54 mg/dL — ABNORMAL HIGH (ref 0.44–1.00)
GFR, Estimated: 40 mL/min — ABNORMAL LOW (ref >60)
Glucose, Bld: 120 mg/dL — ABNORMAL HIGH (ref 70–99)
Potassium: 4.7 mmol/L (ref 3.5–5.1)
Sodium: 140 mmol/L (ref 135–145)

## 2022-06-29 LAB — BRAIN NATRIURETIC PEPTIDE: B Natriuretic Peptide: 461.9 pg/mL — ABNORMAL HIGH (ref 0.0–100.0)

## 2022-06-29 MED ORDER — GLIPIZIDE ER 10 MG PO TB24
10.0000 mg | ORAL_TABLET | Freq: Every day | ORAL | 1 refills | Status: DC
  Filled 2022-06-29 – 2022-07-07 (×2): qty 30, 30d supply, fill #0

## 2022-06-29 MED ORDER — GLIPIZIDE ER 5 MG PO TB24
5.0000 mg | ORAL_TABLET | Freq: Two times a day (BID) | ORAL | 1 refills | Status: DC
  Filled 2022-06-29: qty 180, 90d supply, fill #0

## 2022-06-29 NOTE — Telephone Encounter (Signed)
Unable to reach patient by phone to relay message x2.

## 2022-06-29 NOTE — Patient Instructions (Addendum)
STOP Jardiance   Take the Metolazone  2.'5mg'$  and Potassium tablet today   Labs done today, your results will be available in MyChart, we will contact you for abnormal readings.  Please keep next weeks follow up appointment.  If you have any questions or concerns before your next appointment please send [place] a message through [place] or call our office at [phone].    TO LEAVE A MESSAGE FOR THE NURSE SELECT OPTION 2, PLEASE LEAVE A MESSAGE INCLUDING: YOUR NAME DATE OF BIRTH CALL BACK NUMBER REASON FOR CALL**this is important as we prioritize the call backs  YOU WILL RECEIVE A CALL BACK THE SAME DAY AS LONG AS YOU CALL BEFORE 4:00 PM  At the [facility], you and your health needs are our priority. As part of our continuing mission to provide you with exceptional heart care, we have created designated Provider Care Teams. These Care Teams include your primary Cardiologist (physician) and Advanced Practice Providers (APPs- Physician Assistants and Nurse Practitioners) who all work together to provide you with the care you need, when you need it.   You may see any of the following providers on your designated Care Team at your next follow up: Dr Glori Bickers Dr Loralie Champagne Dr. Roxana Hires, NP Lyda Jester, [place] [facility] [place], [place] Forestine Na, NP Audry Riles, PharmD   Please be sure to bring in all your medications bottles to every appointment.

## 2022-06-29 NOTE — Progress Notes (Signed)
[facility] Consult Note  PCP: Archie Patten NP Primary Cardiologist: Dr [name]  HF Cardiologist: Dr. Aundra Dubin  HPI: Mrs Deshaun is a 56 y.o.female with a history of chronic diastolic heart failure, persistent atrial fibrillation on Xarelto, hypertension, obesity and OSA, not compliant w/ CPAP. 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  Did have VFib with heart cath, treated w/ defibrillation. Echo 04/18/21 with EF 60-65%, no RWMA, severe LVH, RV normal with moderately elevated PA systolic pressure, Lt atrial size severely dilated and RA mildly dilated.  Mild MR, mod to severe TR.    Admitted 01/09/23 for a/c diastolic CHF w/ fluid overload. Afib well rate controlled. Diuresed w/ IV Lasix, then transitioned to GDMT. Discharge home, weight 328 lbs.  Admitted 05/25/22 with A/C HFpEF. Ran out her medications while traveling [place] the [place]. Diuresed with IV lasix and transitioned to torsemide 40 mg bid. . 2D Echo showed LVEF 60-65%, severe LVH, septal thickenness over 20 mm but difficult to measure, RV normal.  Discharge weight 308 pounds.   Seen in TOC 9/26/3, volume up and only taking torsemide once a day and off SGLT2i.  Torsemide increased to 40 mg bid. Jardiance restarted, hydralazine restarted (was not taking at home). Follow up 06/10/22, she remained volume overloaded. She had stopped Jardiance, so this was restarted. Losartan stopped and Entresto 24/26 started.  Today she returns for HF follow up, referred from [facility]. Initially scheduled for pharmacy visit but changed to provider visit as her weight is up 10+ lbs and she has worsening dyspnea. She feels poorly. She is SOB walking short distances on flat ground. No SOB with ADLs. Has chest heaviness and LE swelling. Denies palpitations or PND/Orthopnea. Appetite poor and she is nauseated. No fever or chills. Weight at home 311 pounds. She stopped her  Jardiance as her blood sugars have dropped to 50's. She has not been consistently taking her torsemide twice daily. Her mother does most of the cooking but limits salt. She drinks > 2L/day of fluid. She has CPAP but cannot tolerate the mask.  Labs (9/23): K 4.2, creatinine 1.36  Cardiac Testing  - Echo 8/23): EF 60-65%, severe LVH, Septal thickenness over 66m but difficult to measure, RV normal.    - Echo (04/18/21): EF 60-65% RV normal LA severely dilated. RA mildly dilated. TR mod-severe Mild MR . 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 EF >65%.   - Chest CT 2019 Prominent mediastinal lymph nodes, likely reactive. Lungs/Pleura: +Calcified granulomas.  Review of Systems: [y] = yes, '[ ]'  = no   General: Weight gain [ y]; Weight loss [Blue.Reese]; Anorexia '[ ]' ; Fatigue [ y]; Fever '[ ]' ; Chills '[ ]' ; Weakness '[ ]'   Cardiac: Chest pain/pressure '[ ]' ; Resting SOB '[ ]' ; Exertional SOB [Blue.Reese]; Orthopnea '[ ]' ; Pedal Edema [Blue.Reese]; Palpitations '[ ]' ; Syncope '[ ]' ; Presyncope '[ ]' ; Paroxysmal nocturnal dyspnea'[ ]'   Pulmonary: Cough '[ ]' ; Wheezing'[ ]' ; Hemoptysis'[ ]' ; Sputum '[ ]' ; Snoring [Blue.Reese]  GI: Vomiting'[ ]' ; Dysphagia '[ ]' ; Melena'[ ]' ; Hematochezia '[ ]' ; Heartburn'[ ]' ; Abdominal pain '[ ]' ; Constipation '[ ]' ; Diarrhea '[ ]' ; BRBPR '[ ]'   GU: Hematuria'[ ]' ; Dysuria '[ ]' ; Nocturia'[ ]'  Vascular: Pain in legs with walking '[ ]' ; Pain in feet with lying flat '[ ]' ; Non-healing sores '[ ]' ; Stroke '[ ]' ; TIA '[ ]' ; Slurred speech '[ ]' ;  Neuro: Headaches'[ ]' ; Vertigo'[ ]' ; Seizures'[ ]' ; Paresthesias'[ ]' ;Blurred vision '[ ]' ; Diplopia '[ ]' ; Vision changes '[ ]'   Ortho/Skin: Arthritis '[ ]' ; Joint pain '[ ]' ; Muscle pain '[ ]' ; Joint swelling '[ ]' ; Back Pain '[ ]' ; Rash '[ ]'   Psych: Depression'[ ]' ; Anxiety'[ ]'   Heme: Bleeding problems '[ ]' ; Clotting disorders '[ ]' ; Anemia '[ ]'   Endocrine: Diabetes Blue.Reese ]; Thyroid dysfunction'[ ]'     SH:  Social History   Socioeconomic History   Marital status: Single    Spouse name: Not  on file   Number of children: 0   Years of education: 18   Highest education level: Not on file  Occupational History   Occupation: unemployed  Tobacco Use   Smoking status: Never   Smokeless tobacco: Never  Vaping Use   Vaping Use: Never used  Substance and Sexual Activity   Alcohol use: No   Drug use: No   Sexual activity: Not Currently    Birth control/protection: None  Other Topics Concern   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 But ran out of money and is no longer attending school. (Note patient has bipolar disorder).      Born in [place] but lived in [place] before coming back to [place].       Primary language is Arabic. Lives with mother and brother.   Social Determinants of Health   Financial Resource Strain: Low Risk  (05/26/2022)   Overall Financial Resource Strain (CARDIA)    Difficulty of Paying Living Expenses: Not very hard  Food Insecurity: No Food Insecurity (05/26/2022)   Hunger Vital Sign    Worried About Running Out of Food in the Last Year: Never true    Ran Out of Food in the Last Year: Never true  Transportation Needs: No Transportation Needs (05/26/2022)   PRAPARE - Hydrologist (Medical): No    Lack of Transportation (Non-Medical): No  Physical Activity: Not on file  Stress: No Stress Concern Present (07/15/2018)   Lydia    Feeling of Stress : Only a little  Social Connections: Not on file  Intimate Partner Violence: Not on file    FH:  Family History  Problem Relation Age of Onset   Heart failure Father    Stroke Father    Hypertension Mother    Heart disease Maternal Grandfather     Past Medical History:  Diagnosis Date   Acute on chronic diastolic congestive heart failure ([place]) 11/02/2013   10/03/2015, 11/13/2015, 08/03/2017    Benign essential HTN 11/28/2013   Bipolar disease, chronic ([place])    Chest pain    a. 2012 Myoview: EF 63%, no isch/infarct;  b. 04/2016 Lexiscan MV: EF 73%, no ischemia/infarct-->Low risk.   Chronic diastolic CHF (congestive heart failure) ([place]) 07/23/2011   a. 2015 Echo: EF 55-60%, Gr2 DD;  b. 09/2015 Echo: EF 60-65%, no rwma, mod dil LA, PASP 34mHg.   Cor pulmonale (chronic) ([facility])    History of thyrotoxicosis    HTN (hypertension) 11/28/2013   Hypertensive heart disease 10/18/2013   Hypoglycemia    Insulin dependent type 2 diabetes mellitus, uncontrolled    Mediastinal adenopathy    Morbid obesity due to excess calories ([facility] 02/19/2011   Morbid obesity with BMI of 50.0-59.9, adult ([facility]  OSA (obstructive sleep apnea) 03/06/2011   Persistent atrial fibrillation ([place]) 12/09/2017   Pulmonary HTN, moderate to severe 11/03/2013   Sinusitis, chronic 01/02/2015   SVT (supraventricular tachycardia) ([place]) 12/06/2013   Uncontrolled type 2 diabetes mellitus with hyperglycemia ([facility])     Current Outpatient Medications  Medication Sig Dispense Refill   Accu-Chek Softclix Lancets lancets Use up to 4 times daily 100 each 0   amLODipine (NORVASC) 10 MG tablet Take 1 tablet (10 mg total) by mouth daily. 30 tablet 3   Blood Glucose Monitoring Suppl (ACCU-CHEK GUIDE) w/Device KIT Use to check blood sugar three times daily. 1 kit 0   Blood Pressure Monitor DEVI Please provide patient with insurance approved blood pressure monitor I10.0 1 each 0   carvedilol (COREG) 25 MG tablet Take 1 tablet (25 mg total) by mouth 2 (two) times daily with a meal. 60 tablet 3   empagliflozin (JARDIANCE) 10 MG TABS tablet Take 1 tablet (10 mg total) by mouth daily before breakfast. 30 tablet 3   escitalopram (LEXAPRO) 20 MG tablet Take 1 tablet (20 mg total) by mouth every morning. 30 tablet 0   fluticasone (FLONASE) 50 MCG/ACT nasal spray Place 2 sprays into both nostrils daily. 16 g 6   glipiZIDE (GLUCOTROL XL) 10 MG 24 hr tablet  Take 10 mg by mouth 2 (two) times daily.     glucose blood (ACCU-CHEK GUIDE) test strip Use as directed up to 4 times daily 100 each 0   hydrALAZINE (APRESOLINE) 25 MG tablet Take 1 tablet (25 mg total) by mouth 3 (three) times daily. 90 tablet 3   hydrOXYzine (VISTARIL) 25 MG capsule Take 1 capsule (25 mg total) by mouth every 8 (eight) hours as needed. 60 capsule 1   Incontinence Supply Disposable (INCONTINENCE BRIEF LARGE) MISC Please provide patient with insurance approved incontinence supplies/briefs 18 each 6   metFORMIN (GLUCOPHAGE) 1000 MG tablet Take 1,000 mg by mouth 2 (two) times daily with a meal.     Naphazoline-Pheniramine (EYE ALLERGY RELIEF OP) Place 1 drop into both eyes daily as needed (allergy).     risperiDONE (RISPERDAL) 2 MG tablet Take 1 tablet (2 mg total) by mouth at bedtime. 30 tablet 0   rivaroxaban (XARELTO) 20 MG TABS tablet Take 1 tablet (20 mg total) by mouth daily with supper. 30 tablet 3   sacubitril-valsartan (ENTRESTO) 24-26 MG Take 1 tablet by mouth 2 (two) times daily. 60 tablet 3   spironolactone (ALDACTONE) 25 MG tablet Take 1 tablet (25 mg total) by mouth daily. 30 tablet 3   torsemide (DEMADEX) 20 MG tablet Take 2 tablets (40 mg total) by mouth 2 (two) times daily. 360 tablet 0   No current facility-administered medications for this encounter.   BP 120/78   Pulse 83   Wt (!) 145.2 kg (320 lb)   BMI 50.12 kg/m   Wt Readings from Last 3 Encounters:  06/29/22 (!) 145.2 kg (320 lb)  06/22/22 (!) 140.3 kg (309 lb 3.2 oz)  06/19/22 (!) 138.4 kg (305 lb 3.2 oz)   PHYSICAL EXAM: General:  NAD. Walked into clinic, mild conversational dyspnea HEENT: Normal Neck: Supple. Thick neck, Carotids 2+ bilat; no bruits. No lymphadenopathy or thryomegaly appreciated. Cor: PMI nondisplaced. Irregular rate & rhythm. No rubs, gallops or murmurs. Lungs: Diminished Abdomen: Obese, +tender, nondistended. No hepatosplenomegaly. No bruits or masses. Good bowel  sounds. Extremities: No cyanosis, clubbing, rash, edema Neuro: Alert & oriented x 3, cranial nerves grossly intact. Moves all 4  extremities w/o difficulty. + Tearful  ASSESSMENT & PLAN: 1. Chronic Diastolic Heart Failure  - Echo 7/22 EF 60-65%, severe LVH, RV mildly dilated normal systolic function  - Echo 8/23 EF 60-65%, severe LVH, Septal thickenness over 41m but difficult to measure, RV normal.  - Severe LVH may be secondary to HTN but will need cMRI to r/o infiltrative CM  - NYHA III-IIIb. Confounded by obesity, deconditioning, OSA and probable OHS. Exam difficult for volume, but weight up 11 lbs from previous and she has worsening symptoms. - OK to stop Jardiance with low blood sugars. Will reach out to PCP to see if we can stop glipizide with hopes of re-challenge with SGLT2i. - Take metolazone 2.5 mg + 20 KCL x 1 today. BMET and BNP today. Will repeat at follow up next week. - Continue torsemide 40 mg bid. Stressed importance of taking bid. - Continue Entresto 24-26 mg bid  - Continue spironolactone 25 mg daily  - dicussed importance of dietary restriction of sodium and limiting fluid intake + daily wts for fluid status monitoring  - Will continue w/u to exclude possible infiltrative CM (sarcoid, amyloidosis). Prior Chest CT in 2019 +Prominent mediastinal lymph nodes + calcified granulomas. Check multiple myeloma and SPEP as part of amyloid w/u. cMRI per above. If suggestive of possible sarcoid, will need cardiac PET. May also need RHC and consideration for CardioMEMs. I discussed RHC w/ her today but she currently adamantly refuses, citing prior cath complications. Will discuss again at future appointments.  - If she continues to have significant dyspnea despite improvement in volume status, will need referral to pulmonology. Suspect OHS. - Refer to paramedicine to help with med compliance.  2. Hypertension  - Controlled. - GDMT per above  - Consider stopping amlodipine next visit and  increasing GDMT.   3. Chronic Afib - Rate controlled. Unlikely to hold NSR w/ rhythm control given chronicity and non compliance w/ CPAP  - Continue beta blocker. - Continue Xarelto.  4. OSA - Not compliant w/ CPAP  - stress importance of compliance. Says device dose not work. Needs to f/u w/ ordering provider (Geryl RankinsNP) for help obtaining new CPAP. I will send her a message today.   5. Morbid Obesity - Body mass index is 50.12 kg/m. - Discussed portion control.  - Consider referral to Pharmacy for semaglutide.   6. DM II  - A1c 10/5 (8/23) - Stop Jardiance as above. - Needs follow up with PCP. I will send message to PCP to see if we can stop glipizide in favor of re-trialing SGLT2i.  7. Chest pressure - LHC 2019 w/ normal cors. ? Small vessel disease. Consider trial of Ranexa next visit if symptoms continue. cMRI as above to r/o infiltrative CM.  - Suspect related to volume overload. - Diuretics as above.  Follow up next week with APP (will need BMET and REDS)  [id number]-BC   1:18 PM

## 2022-06-30 ENCOUNTER — Other Ambulatory Visit: Payer: Self-pay

## 2022-07-01 NOTE — Telephone Encounter (Signed)
Letter sent.

## 2022-07-01 NOTE — Telephone Encounter (Signed)
Please send letter. Thanks.

## 2022-07-02 ENCOUNTER — Telehealth (HOSPITAL_COMMUNITY): Payer: Self-pay | Admitting: Licensed Clinical Social Worker

## 2022-07-02 NOTE — Progress Notes (Signed)
Heart and Vascular Care Navigation  07/02/2022  Kayde Atkerson 1966-05-24 [id number]  Reason for Referral: paramedicine   Engaged with patient by telephone for follow up visit for Heart and Vascular Care Coordination.                                                                                                   Paramedicine Initial Assessment:  Housing:  In what kind of housing do you live? House/apt/trailer/shelter?  Do you rent/pay a mortgage/own? apartment  Do you live with anyone? Mom  Do you have family or friends who live locally? Also has a brother who lives locally   Income:  What is your current source of income? Not at this time- hoping to return to work- mom currently supporting her financially  How hard is it for you to pay for the basics like food housing, medical care, and utilities?  Insurance:  Are you currently insured? No- had Medicaid but now only has Family planning  Do you have prescription coverage? No utilizing HF fund  If no insurance, have you applied for coverage (Medicaid, disability, marketplace etc)? Recently changed from medicaid to family planning medicaid  Transportation:  Do you have transportation to your medical appointments? Mom drives her to appts   Daily Health Needs: Do you have a working scale at home? yes  Do you have a PCP? Archie Patten  Are there any additional barriers you see to getting the care you need? no  CSW will continue to follow through paramedicine program and assist as needed.                                   HRT/VAS Care Coordination     Living arrangements for the past 2 months Apartment   Lives with: Self   Patient Has Concern With Paying Medical Bills Yes   Medical Bill Referrals: [employer]   Does Patient Have Prescription Coverage? No   Home Assistive Devices/Equipment Shower chair with back   DME Agency NA   [place] History:                                                                              [place]: No Food Insecurity (05/26/2022)  Housing: Low Risk  (05/26/2022)  Transportation Needs: No Transportation Needs (05/26/2022)  Alcohol Screen: Low Risk  (05/26/2022)  Depression (PHQ2-9): Low Risk  (06/22/2022)  Financial Resource Strain: Low Risk  (05/26/2022)  Stress: No Stress Concern Present (07/15/2018)  Tobacco Use: Low Risk  (06/29/2022)    Follow-up plan:    Referral being sent to paramedics for assignment and follow up  Jorge Ny,  LCSW Clinical Social Worker [facility] Desk#: [phone] Cell#: [phone]

## 2022-07-06 ENCOUNTER — Other Ambulatory Visit: Payer: Self-pay

## 2022-07-07 ENCOUNTER — Other Ambulatory Visit: Payer: Self-pay

## 2022-07-07 NOTE — Progress Notes (Signed)
Advanced Heart Failure Clinic Consult Note  PCP: Archie Patten NP Primary Cardiologist: Dr [name]  HF Cardiologist: Dr. Aundra Dubin  HPI: [name] is a 56 y.o.female with a history of chronic diastolic heart failure, persistent atrial fibrillation on Xarelto, hypertension, obesity and OSA, not compliant w/ CPAP. 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  Did have VFib with heart cath, treated w/ defibrillation. Echo 04/18/21 with EF 60-65%, no RWMA, severe LVH, RV normal with moderately elevated PA systolic pressure, Lt atrial size severely dilated and RA mildly dilated.  Mild MR, mod to severe TR.    Admitted 10/29/98 for a/c diastolic CHF w/ fluid overload. Afib well rate controlled. Diuresed w/ IV Lasix, then transitioned to GDMT. Discharge home, weight 328 lbs.  Admitted 05/25/22 with A/C HFpEF. Ran out her medications while [place] the [place]. Diuresed with IV lasix and transitioned to torsemide 40 mg bid. . 2D Echo showed LVEF 60-65%, severe LVH, septal thickenness over 20 mm but difficult to measure, RV normal.  Discharge weight 308 pounds.   Seen in TOC 9/26/3, volume up and only taking torsemide once a day and off SGLT2i.  Torsemide increased to 40 mg bid. Jardiance restarted, hydralazine restarted (was not taking at home). Follow up 06/10/22, she remained volume overloaded. She had stopped Jardiance, so this was restarted. Losartan stopped and Entresto 24/26 started.  Today she returns for HF follow up, referred from [facility]. Initially scheduled for pharmacy visit but changed to provider visit as her weight is up 10+ lbs and she has worsening dyspnea. She feels poorly. She is SOB walking short distances on flat ground. No SOB with ADLs. Has chest heaviness and LE swelling. Denies palpitations or PND/Orthopnea. Appetite poor and she is nauseated. No fever or chills. Weight at home 311 pounds. She stopped her  Jardiance as her blood sugars have dropped to 50's. She has not been consistently taking her torsemide twice daily. Her mother does most of the cooking but limits salt. She drinks > 2L/day of fluid. She has CPAP but cannot tolerate the mask.  Labs (9/23): K 4.2, creatinine 1.36  Cardiac Testing  - Echo 8/23): EF 60-65%, severe LVH, Septal thickenness over 40m but difficult to measure, RV normal.    - Echo (04/18/21): EF 60-65% RV normal LA severely dilated. RA mildly dilated. TR mod-severe Mild MR . 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 EF >65%.   - Chest CT 2019 Prominent mediastinal lymph nodes, likely reactive. Lungs/Pleura: +Calcified granulomas.  Review of Systems: [y] = yes, '[ ]'  = no   General: Weight gain [ y]; Weight loss [Blue.Reese]; Anorexia '[ ]' ; Fatigue [ y]; Fever '[ ]' ; Chills '[ ]' ; Weakness '[ ]'   Cardiac: Chest pain/pressure '[ ]' ; Resting SOB '[ ]' ; Exertional SOB [Blue.Reese]; Orthopnea '[ ]' ; Pedal Edema [Blue.Reese]; Palpitations '[ ]' ; Syncope '[ ]' ; Presyncope '[ ]' ; Paroxysmal nocturnal dyspnea'[ ]'   Pulmonary: Cough '[ ]' ; Wheezing'[ ]' ; Hemoptysis'[ ]' ; Sputum '[ ]' ; Snoring [Blue.Reese]  GI: Vomiting'[ ]' ; Dysphagia '[ ]' ; Melena'[ ]' ; Hematochezia '[ ]' ; Heartburn'[ ]' ; Abdominal pain '[ ]' ; Constipation '[ ]' ; Diarrhea '[ ]' ; BRBPR '[ ]'   GU: Hematuria'[ ]' ; Dysuria '[ ]' ; Nocturia'[ ]'  Vascular: Pain in legs with walking '[ ]' ; Pain in feet with lying flat '[ ]' ; Non-healing sores '[ ]' ; Stroke '[ ]' ; TIA '[ ]' ; Slurred speech '[ ]' ;  Neuro: Headaches'[ ]' ; Vertigo'[ ]' ; Seizures'[ ]' ; Paresthesias'[ ]' ;Blurred vision '[ ]' ; Diplopia '[ ]' ; Vision changes '[ ]'   Ortho/Skin: Arthritis '[ ]' ; Joint pain '[ ]' ; Muscle pain '[ ]' ; Joint swelling '[ ]' ; Back Pain '[ ]' ; Rash '[ ]'   Psych: Depression'[ ]' ; Anxiety'[ ]'   Heme: Bleeding problems '[ ]' ; Clotting disorders '[ ]' ; Anemia '[ ]'   Endocrine: Diabetes Blue.Reese ]; Thyroid dysfunction'[ ]'     SH:  Social History   Socioeconomic History   Marital status: Single    Spouse name: Not  on file   Number of children: 0   Years of education: 18   Highest education level: Not on file  Occupational History   Occupation: unemployed  Tobacco Use   Smoking status: Never   Smokeless tobacco: Never  Vaping Use   Vaping Use: Never used  Substance and Sexual Activity   Alcohol use: No   Drug use: No   Sexual activity: Not Currently    Birth control/protection: None  Other Topics Concern   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 But ran out of money and is no longer attending school. (Note patient has bipolar disorder).      Born in [place] but lived in [place] before coming back to [place].       Primary language is Arabic. Lives with mother and brother.   Social Determinants of Health   Financial Resource Strain: Low Risk  (05/26/2022)   Overall Financial Resource Strain (CARDIA)    Difficulty of Paying Living Expenses: Not very hard  Food Insecurity: No Food Insecurity (05/26/2022)   Hunger Vital Sign    Worried About Running Out of Food in the Last Year: Never true    Ran Out of Food in the Last Year: Never true  Transportation Needs: No Transportation Needs (05/26/2022)   PRAPARE - Hydrologist (Medical): No    Lack of Transportation (Non-Medical): No  Physical Activity: Not on file  Stress: No Stress Concern Present (07/15/2018)   [place]    Feeling of Stress : Only a little  Social Connections: Not on file  Intimate Partner Violence: Not on file    FH:  Family History  Problem Relation Age of Onset   Heart failure Father    Stroke Father    Hypertension Mother    Heart disease Maternal Grandfather     Past Medical History:  Diagnosis Date   Acute on chronic diastolic congestive heart failure ([place]) 11/02/2013   10/03/2015, 11/13/2015, 08/03/2017    Benign essential HTN 11/28/2013   Bipolar disease, chronic ([place])    Chest pain    a. 2012 Myoview: EF 63%, no isch/infarct;  b. 04/2016 Lexiscan MV: EF 73%, no ischemia/infarct-->Low risk.   Chronic diastolic CHF (congestive heart failure) ([place]) 07/23/2011   a. 2015 Echo: EF 55-60%, Gr2 DD;  b. 09/2015 Echo: EF 60-65%, no rwma, mod dil LA, PASP 63mHg.   Cor pulmonale (chronic) (H[facility])    History of thyrotoxicosis    HTN (hypertension) 11/28/2013   Hypertensive heart disease 10/18/2013   Hypoglycemia    Insulin dependent type 2 diabetes mellitus, uncontrolled    Mediastinal adenopathy    Morbid obesity due to excess calories ([facility] 02/19/2011   Morbid obesity with BMI of 50.0-59.9, adult ([facility]  OSA (obstructive sleep apnea) 03/06/2011   Persistent atrial fibrillation ([place]) 12/09/2017   Pulmonary HTN, moderate to severe 11/03/2013   Sinusitis, chronic 01/02/2015   SVT (supraventricular tachycardia) 12/06/2013   Uncontrolled type 2 diabetes mellitus with hyperglycemia (H[facility])     Current Outpatient Medications  Medication Sig Dispense Refill   Accu-Chek Softclix Lancets lancets Use up to 4 times daily 100 each 0   amLODipine (NORVASC) 10 MG tablet Take 1 tablet (10 mg total) by mouth daily. 30 tablet 3   Blood Glucose Monitoring Suppl (ACCU-CHEK GUIDE) w/Device KIT Use to check blood sugar three times daily. 1 kit 0   Blood Pressure Monitor DEVI Please provide patient with insurance approved blood pressure monitor I10.0 1 each 0   carvedilol (COREG) 25 MG tablet Take 1 tablet (25 mg total) by mouth 2 (two) times daily with a meal. 60 tablet 3   empagliflozin (JARDIANCE) 10 MG TABS tablet Take 1 tablet (10 mg total) by mouth daily before breakfast. (Patient not taking: Reported on 06/29/2022) 30 tablet 3   escitalopram (LEXAPRO) 20 MG tablet Take 1 tablet (20 mg total) by mouth every morning. 30 tablet 0   fluticasone (FLONASE) 50 MCG/ACT nasal spray Place 2 sprays into both nostrils daily. 16 g 6    glipiZIDE (GLUCOTROL XL) 10 MG 24 hr tablet Take 1 tablet (10 mg total) by mouth daily with breakfast. 90 tablet 1   glucose blood (ACCU-CHEK GUIDE) test strip Use as directed up to 4 times daily 100 each 0   hydrALAZINE (APRESOLINE) 25 MG tablet Take 1 tablet (25 mg total) by mouth 3 (three) times daily. 90 tablet 3   hydrOXYzine (VISTARIL) 25 MG capsule Take 1 capsule (25 mg total) by mouth every 8 (eight) hours as needed. 60 capsule 1   Incontinence Supply Disposable (INCONTINENCE BRIEF LARGE) MISC Please provide patient with insurance approved incontinence supplies/briefs 18 each 6   metFORMIN (GLUCOPHAGE) 1000 MG tablet Take 1,000 mg by mouth 2 (two) times daily with a meal.     Naphazoline-Pheniramine (EYE ALLERGY RELIEF OP) Place 1 drop into both eyes daily as needed (allergy).     risperiDONE (RISPERDAL) 2 MG tablet Take 1 tablet (2 mg total) by mouth at bedtime. 30 tablet 0   rivaroxaban (XARELTO) 20 MG TABS tablet Take 1 tablet (20 mg total) by mouth daily with supper. 30 tablet 3   sacubitril-valsartan (ENTRESTO) 24-26 MG Take 1 tablet by mouth 2 (two) times daily. 60 tablet 3   spironolactone (ALDACTONE) 25 MG tablet Take 1 tablet (25 mg total) by mouth daily. 30 tablet 3   torsemide (DEMADEX) 20 MG tablet Take 2 tablets (40 mg total) by mouth 2 (two) times daily. 360 tablet 0   No current facility-administered medications for this visit.   There were no vitals taken for this visit.  Wt Readings from Last 3 Encounters:  06/29/22 (!) 145.2 kg (320 lb)  06/22/22 (!) 140.3 kg (309 lb 3.2 oz)  06/19/22 (!) 138.4 kg (305 lb 3.2 oz)   PHYSICAL EXAM: General:  NAD. Walked into clinic, mild conversational dyspnea HEENT: Normal Neck: Supple. Thick neck, Carotids 2+ bilat; no bruits. No lymphadenopathy or thryomegaly appreciated. Cor: PMI nondisplaced. Irregular rate & rhythm. No rubs, gallops or murmurs. Lungs: Diminished Abdomen: Obese, +tender, nondistended. No hepatosplenomegaly. No  bruits or masses. Good bowel sounds. Extremities: No cyanosis, clubbing, rash, edema Neuro: Alert & oriented x 3, cranial nerves grossly intact. Moves all 4 extremities w/o difficulty. +  Tearful  ASSESSMENT & PLAN: 1. Chronic Diastolic Heart Failure  - Echo 7/22 EF 60-65%, severe LVH, RV mildly dilated normal systolic function  - Echo 8/23 EF 60-65%, severe LVH, Septal thickenness over 75m but difficult to measure, RV normal.  - Severe LVH may be secondary to HTN but will need cMRI to r/o infiltrative CM  - NYHA III-IIIb. Confounded by obesity, deconditioning, OSA and probable OHS. Exam difficult for volume, but weight up 11 lbs from previous and she has worsening symptoms. - OK to stop Jardiance with low blood sugars. Will reach out to PCP to see if we can stop glipizide with hopes of re-challenge with SGLT2i. - Take metolazone 2.5 mg + 20 KCL x 1 today. BMET and BNP today. Will repeat at follow up next week. - Continue torsemide 40 mg bid. Stressed importance of taking bid. - Continue Entresto 24-26 mg bid  - Continue spironolactone 25 mg daily  - dicussed importance of dietary restriction of sodium and limiting fluid intake + daily wts for fluid status monitoring  - Will continue w/u to exclude possible infiltrative CM (sarcoid, amyloidosis). Prior Chest CT in 2019 +Prominent mediastinal lymph nodes + calcified granulomas. Check multiple myeloma and SPEP as part of amyloid w/u. cMRI per above. If suggestive of possible sarcoid, will need cardiac PET. May also need RHC and consideration for CardioMEMs. I discussed RHC w/ her today but she currently adamantly refuses, citing prior cath complications. Will discuss again at future appointments.  - If she continues to have significant dyspnea despite improvement in volume status, will need referral to pulmonology. Suspect OHS. - Refer to paramedicine to help with med compliance.  2. Hypertension  - Controlled. - GDMT per above  - Consider  stopping amlodipine next visit and increasing GDMT.   3. Chronic Afib - Rate controlled. Unlikely to hold NSR w/ rhythm control given chronicity and non compliance w/ CPAP  - Continue beta blocker. - Continue Xarelto.  4. OSA - Not compliant w/ CPAP  - stress importance of compliance. Says device dose not work. Needs to f/u w/ ordering provider (Geryl RankinsNP) for help obtaining new CPAP. I will send her a message today.   5. Morbid Obesity - There is no height or weight on file to calculate BMI. - Discussed portion control.  - Consider referral to Pharmacy for semaglutide.   6. DM II  - A1c 10/5 (8/23) - Stop Jardiance as above. - Needs follow up with PCP. I will send message to PCP to see if we can stop glipizide in favor of re-trialing SGLT2i.  7. Chest pressure - LHC 2019 w/ normal cors. ? Small vessel disease. Consider trial of Ranexa next visit if symptoms continue. cMRI as above to r/o infiltrative CM.  - Suspect related to volume overload. - Diuretics as above.  Follow up next week with APP (will need BMET and REDS)  JFarmersvilleFNP-BC   8:13 AM

## 2022-07-08 ENCOUNTER — Telehealth (HOSPITAL_COMMUNITY): Payer: Self-pay | Admitting: Pharmacy Technician

## 2022-07-08 ENCOUNTER — Ambulatory Visit (HOSPITAL_COMMUNITY)
Admission: RE | Admit: 2022-07-08 | Discharge: 2022-07-08 | Disposition: A | Discharge status: Home / Self Care | Payer: Self-pay | Source: Ambulatory Visit | Attending: Family Medicine | Admitting: Family Medicine

## 2022-07-08 ENCOUNTER — Other Ambulatory Visit: Payer: Self-pay

## 2022-07-08 ENCOUNTER — Encounter (HOSPITAL_COMMUNITY): Payer: Self-pay

## 2022-07-08 VITALS — BP 126/72 | HR 74 | Wt 317.4 lb

## 2022-07-08 DIAGNOSIS — G4733 Obstructive sleep apnea (adult) (pediatric): Secondary | ICD-10-CM | POA: Insufficient documentation

## 2022-07-08 DIAGNOSIS — Z794 Long term (current) use of insulin: Secondary | ICD-10-CM | POA: Insufficient documentation

## 2022-07-08 DIAGNOSIS — I482 Chronic atrial fibrillation, unspecified: Secondary | ICD-10-CM

## 2022-07-08 DIAGNOSIS — Z79899 Other long term (current) drug therapy: Secondary | ICD-10-CM | POA: Insufficient documentation

## 2022-07-08 DIAGNOSIS — I110 Hypertensive heart disease with heart failure: Secondary | ICD-10-CM | POA: Insufficient documentation

## 2022-07-08 DIAGNOSIS — Z8679 Personal history of other diseases of the circulatory system: Secondary | ICD-10-CM | POA: Insufficient documentation

## 2022-07-08 DIAGNOSIS — Z7984 Long term (current) use of oral hypoglycemic drugs: Secondary | ICD-10-CM | POA: Insufficient documentation

## 2022-07-08 DIAGNOSIS — R0789 Other chest pain: Secondary | ICD-10-CM | POA: Insufficient documentation

## 2022-07-08 DIAGNOSIS — R42 Dizziness and giddiness: Secondary | ICD-10-CM | POA: Insufficient documentation

## 2022-07-08 DIAGNOSIS — E1165 Type 2 diabetes mellitus with hyperglycemia: Secondary | ICD-10-CM

## 2022-07-08 DIAGNOSIS — E119 Type 2 diabetes mellitus without complications: Secondary | ICD-10-CM | POA: Insufficient documentation

## 2022-07-08 DIAGNOSIS — Z7901 Long term (current) use of anticoagulants: Secondary | ICD-10-CM | POA: Insufficient documentation

## 2022-07-08 DIAGNOSIS — Z6841 Body Mass Index (BMI) 40.0 and over, adult: Secondary | ICD-10-CM | POA: Insufficient documentation

## 2022-07-08 DIAGNOSIS — I10 Essential (primary) hypertension: Secondary | ICD-10-CM

## 2022-07-08 DIAGNOSIS — Z91199 Patient's noncompliance with other medical treatment and regimen due to unspecified reason: Secondary | ICD-10-CM | POA: Insufficient documentation

## 2022-07-08 DIAGNOSIS — I5032 Chronic diastolic (congestive) heart failure: Secondary | ICD-10-CM | POA: Insufficient documentation

## 2022-07-08 LAB — BASIC METABOLIC PANEL
Anion gap: 6 (ref 5–15)
BUN: 37 mg/dL — ABNORMAL HIGH (ref 6–20)
CO2: 27 mmol/L (ref 22–32)
Calcium: 8.7 mg/dL — ABNORMAL LOW (ref 8.9–10.3)
Chloride: 105 mmol/L (ref 98–111)
Creatinine, Ser: 1.27 mg/dL — ABNORMAL HIGH (ref 0.44–1.00)
GFR, Estimated: 50 mL/min — ABNORMAL LOW (ref >60)
Glucose, Bld: 103 mg/dL — ABNORMAL HIGH (ref 70–99)
Potassium: 4.2 mmol/L (ref 3.5–5.1)
Sodium: 138 mmol/L (ref 135–145)

## 2022-07-08 LAB — BRAIN NATRIURETIC PEPTIDE: B Natriuretic Peptide: 454.7 pg/mL — ABNORMAL HIGH (ref 0.0–100.0)

## 2022-07-08 MED ORDER — ENTRESTO 49-51 MG PO TABS
1.0000 | ORAL_TABLET | Freq: Two times a day (BID) | ORAL | 6 refills | Status: DC
  Filled 2022-07-08: qty 60, 30d supply, fill #0

## 2022-07-08 MED ORDER — EMPAGLIFLOZIN 10 MG PO TABS
10.0000 mg | ORAL_TABLET | Freq: Every day | ORAL | 6 refills | Status: DC
  Filled 2022-07-08: qty 30, 30d supply, fill #0

## 2022-07-08 MED ORDER — GLIPIZIDE ER 5 MG PO TB24
5.0000 mg | ORAL_TABLET | Freq: Every day | ORAL | 6 refills | Status: DC
  Filled 2022-07-08 – 2022-07-15 (×2): qty 30, 30d supply, fill #0

## 2022-07-08 NOTE — Patient Instructions (Addendum)
Thank you for coming in today  Labs were done today, if any labs are abnormal the clinic will call you No news is good news  STOP Amlodipine INCREASE Entresto 49/51 mg 1 tablet daily 2 times a day DECREASE Glipizide to 5 mg 1 tablet twice daily  RESTART Jardiance 10 mg 1 tablet daily   Your physician recommends that you schedule a follow-up appointment in:  Keep follow up with Dr. Aundra Dubin    Do the following things EVERYDAY: Weigh yourself in the morning before breakfast. Write it down and keep it in a log. Take your medicines as prescribed Eat low salt foods--Limit salt (sodium) to 2000 mg per day.  Stay as active as you can everyday Limit all fluids for the day to less than 2 liters  At the [facility], you and your health needs are our priority. As part of our continuing mission to provide you with exceptional heart care, we have created designated Provider Care Teams. These Care Teams include your primary Cardiologist (physician) and Advanced Practice Providers (APPs- Physician Assistants and Nurse Practitioners) who all work together to provide you with the care you need, when you need it.   You may see any of the following providers on your designated Care Team at your next follow up: Dr Glori Bickers Dr Loralie Champagne Dr. Roxana Hires, NP Lyda Jester, [place] [facility] [place], [place] Forestine Na, NP Audry Riles, PharmD   Please be sure to bring in all your medications bottles to every appointment.   If you have any questions or concerns before your next appointment please send [place] a message through [place] or call our office at [phone].    TO LEAVE A MESSAGE FOR THE NURSE SELECT OPTION 2, PLEASE LEAVE A MESSAGE INCLUDING: YOUR NAME DATE OF BIRTH CALL BACK NUMBER REASON FOR CALL**this is important as we prioritize the call backs  YOU WILL RECEIVE A CALL BACK THE SAME DAY AS LONG AS YOU CALL BEFORE 4:00 PM

## 2022-07-08 NOTE — Progress Notes (Signed)
ReDS Vest / Clip - 07/08/22 1500       ReDS Vest / Clip   Station Marker C    Ruler Value 31    ReDS Value Range High volume overload    ReDS Actual Value 51    Anatomical Comments sitting

## 2022-07-08 NOTE — Addendum Note (Signed)
Encounter addended by: Payton Mccallum, RN on: 07/08/2022 3:52 PM  Actions taken: Clinical Note Signed

## 2022-07-08 NOTE — Progress Notes (Signed)
Given jardiance 10 mg samples lot# [id number] exp 10/25 Entresto 49/51 mg samples exp 08/25 lot #[id number]  Pt verbalized understanding of medications

## 2022-07-08 NOTE — Telephone Encounter (Signed)
Advanced Heart Failure Patient Advocate Encounter  Sent in [place] assistance application to [employer] via fax.

## 2022-07-09 ENCOUNTER — Telehealth (HOSPITAL_COMMUNITY): Payer: Self-pay | Admitting: Emergency Medicine

## 2022-07-09 NOTE — Telephone Encounter (Signed)
Called and spoke with pt's mother @ 9:59 a.m. and she advised [name] is still sleeping.  Gave her my name and number to call me when she wakes to discuss the paramedicine program and schedule initial visit.

## 2022-07-10 ENCOUNTER — Telehealth (HOSPITAL_COMMUNITY): Payer: Self-pay | Admitting: Emergency Medicine

## 2022-07-10 NOTE — Telephone Encounter (Signed)
Spoke w/ [name] and introduced myself and the Paramedicine program.  Scheduled new pt. Home visit for 10/18@ 4:00    [name], [place] 07/10/2022

## 2022-07-15 ENCOUNTER — Other Ambulatory Visit (HOSPITAL_COMMUNITY): Payer: Self-pay | Admitting: Emergency Medicine

## 2022-07-15 ENCOUNTER — Other Ambulatory Visit: Payer: Self-pay

## 2022-07-15 NOTE — Progress Notes (Signed)
Paramedicine Encounter    Patient ID: [name], female    DOB: [birth year], 56 y.o.   MRN: [id number]   There were no vitals taken for this visit. Weight yesterday-not taken  Last visit weight- 317lb  CBG 254  Today was initial visit with [name].  A&O x 4, skin W&D w/ good color.  She denies chest pain or SOB.  Lung sounds clear and equal bilat.  No edema noted to her extremities.  Although she denies SOB she appeared to be working a little to breath and when I asked her about it she says, "this is normal for me"  She has home O2 that she is not using.  Her mother does her medicines for her and pill box had been reconciled.  I reviewed med for accuracy and her mom had placed 2 83m. Spiro in a.m.  She is prescribed 1 267mdaily so I removed the extra pill.  She has an appointment in the clinic tomorrow @ 1:45 and I will be with her during this visit.  I advised her to bring her meds and pill box to her visit.  Home visit complete.    DeRenee RamusEMEaton0/19/2023   Patient Care Team: FlGildardo PoundsNP as PCP - General (Nurse Practitioner) CrLelon PerlaMD as PCP - Cardiology (Cardiology) CrLelon PerlaMD (Cardiology) Pccm, Md, MD as Consulting Physician (Pulmonary Disease) ClDeberah PeltonNP as Nurse Practitioner (Cardiology)  Patient Active Problem List   Diagnosis Date Noted   Essential hypertension 05/26/2022   Hypokalemia 05/26/2022   CHF exacerbation (HCBrielle0818/29/9371 Acute diastolic CHF (congestive heart failure) (HCKnowles02/13/2023   Acute on chronic congestive heart failure (HCCutler07/22/2022   Acute on chronic heart failure (HCMountain Lake Park07/21/2022   Elevated troponin    Obesity hypoventilation syndrome (HCKingston03/19/2021   Depression 12/15/2019   ILD (interstitial lung disease) (HCDickinson08/30/2020   Exertional angina 05/28/2019   AKI (acute kidney injury) (HCCastle Dale   Hematochezia 07/14/2018   Acute posthemorrhagic anemia 07/14/2018   GIB  (gastrointestinal bleeding) 07/14/2018   Persistent atrial fibrillation (HCLivingston   Uncontrolled type 2 diabetes mellitus with hyperglycemia (HCTeller   Mycobacterium avium complex (HCWilliamsburg03/17/2017   Pyrexia    Dyspnea 11/13/2015   Mediastinal adenopathy 11/13/2015   Abnormal CT scan, chest 11/11/2015   Chest pain 11/11/2015   Depression (emotion) 03/07/2015   Noninfectious gastroenteritis and colitis 01/02/2015   Sinusitis, chronic 01/02/2015   Midline low back pain without sciatica 09/10/2014   Bipolar 1 disorder, mixed, moderate (HCBelfast10/01/2014   Stress incontinence 07/02/2014   Mania (HCAvonia03/15/2015   Speech abnormality 12/08/2013   SVT (supraventricular tachycardia) 12/06/2013   H/O non-insulin dependent diabetes mellitus 11/28/2013   Pulmonary HTN, moderate to severe 11/03/2013   Acute on chronic diastolic congestive heart failure (HCFairfield02/01/2014   Hypertensive heart disease 10/18/2013   Non-insulin dependent type 2 diabetes mellitus (HCOxoboxo River01/21/2015   Chronic diastolic heart failure (HCReese10/25/2012   Class 3 severe obesity due to excess calories with serious comorbidity and body mass index (BMI) of 60.0 to 69.9 in adult ([facility]/24/2012   Non-ST elevation (NSTEMI) myocardial infarction (HCMiltona08/10/2008   Bipolar disorder     Current Outpatient Medications:    Accu-Chek Softclix Lancets lancets, Use up to 4 times daily, Disp: 100 each, Rfl: 0   Blood Glucose Monitoring Suppl (ACCU-CHEK GUIDE) w/Device KIT, Use to check blood sugar three times daily., Disp:  1 kit, Rfl: 0   Blood Pressure Monitor DEVI, Please provide patient with insurance approved blood pressure monitor I10.0, Disp: 1 each, Rfl: 0   carvedilol (COREG) 25 MG tablet, Take 1 tablet (25 mg total) by mouth 2 (two) times daily with a meal., Disp: 60 tablet, Rfl: 3   empagliflozin (JARDIANCE) 10 MG TABS tablet, Take 1 tablet (10 mg total) by mouth daily before breakfast., Disp: 30 tablet, Rfl: 6   escitalopram  (LEXAPRO) 20 MG tablet, Take 1 tablet (20 mg total) by mouth every morning., Disp: 30 tablet, Rfl: 0   fluticasone (FLONASE) 50 MCG/ACT nasal spray, Place 2 sprays into both nostrils daily., Disp: 16 g, Rfl: 6   glipiZIDE (GLUCOTROL XL) 5 MG 24 hr tablet, Take 1 tablet (5 mg total) by mouth daily with breakfast., Disp: 30 tablet, Rfl: 6   glucose blood (ACCU-CHEK GUIDE) test strip, Use as directed up to 4 times daily, Disp: 100 each, Rfl: 0   hydrALAZINE (APRESOLINE) 25 MG tablet, Take 1 tablet (25 mg total) by mouth 3 (three) times daily., Disp: 90 tablet, Rfl: 3   hydrOXYzine (VISTARIL) 25 MG capsule, Take 1 capsule (25 mg total) by mouth every 8 (eight) hours as needed., Disp: 60 capsule, Rfl: 1   Incontinence Supply Disposable (INCONTINENCE BRIEF LARGE) MISC, Please provide patient with insurance approved incontinence supplies/briefs, Disp: 18 each, Rfl: 6   metFORMIN (GLUCOPHAGE) 1000 MG tablet, Take 1,000 mg by mouth 2 (two) times daily with a meal., Disp: , Rfl:    Naphazoline-Pheniramine (EYE ALLERGY RELIEF OP), Place 1 drop into both eyes daily as needed (allergy)., Disp: , Rfl:    risperiDONE (RISPERDAL) 2 MG tablet, Take 1 tablet (2 mg total) by mouth at bedtime., Disp: 30 tablet, Rfl: 0   rivaroxaban (XARELTO) 20 MG TABS tablet, Take 1 tablet (20 mg total) by mouth daily with supper., Disp: 30 tablet, Rfl: 3   sacubitril-valsartan (ENTRESTO) 49-51 MG, Take 1 tablet by mouth 2 (two) times daily., Disp: 60 tablet, Rfl: 6   solifenacin (VESICARE) 10 MG tablet, Take 10 mg by mouth daily., Disp: , Rfl:    spironolactone (ALDACTONE) 25 MG tablet, Take 1 tablet (25 mg total) by mouth daily., Disp: 30 tablet, Rfl: 3   torsemide (DEMADEX) 20 MG tablet, Take 2 tablets (40 mg total) by mouth 2 (two) times daily., Disp: 360 tablet, Rfl: 0 Allergies  Allergen Reactions   Acetaminophen Other (See Comments)    Seizure-like "fits" as a child   Caffeine     Tense, anxiety, increased urination    [place] [Dapagliflozin] Other (See Comments)    Hallucinations, drop in blood sugar   Lisinopril Rash    Rash with lisinopril; but fosinopril is ok per patient      Social History   Socioeconomic History   Marital status: Single    Spouse name: Not on file   Number of children: 0   Years of education: 18   Highest education level: Not on file  Occupational History   Occupation: unemployed  Tobacco Use   Smoking status: Never   Smokeless tobacco: Never  Vaping Use   Vaping Use: Never used  Substance and Sexual Activity   Alcohol use: No   Drug use: No   Sexual activity: Not Currently    Birth control/protection: None  Other Topics Concern   Not on file  Social History Narrative   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  Then was enrolled in a MPH program at [employer]. But ran out of money and is no longer attending school. (Note patient has bipolar disorder).      Born in [place] but lived in [place] before coming back to [place].       Primary language is Arabic. Lives with mother and brother.   Social Determinants of Health   Financial Resource Strain: Low Risk  (05/26/2022)   Overall Financial Resource Strain (CARDIA)    Difficulty of Paying Living Expenses: Not very hard  Food Insecurity: No Food Insecurity (05/26/2022)   Hunger Vital Sign    Worried About Running Out of Food in the Last Year: Never true    Ran Out of Food in the Last Year: Never true  Transportation Needs: No Transportation Needs (05/26/2022)   PRAPARE - Hydrologist (Medical): No    Lack of Transportation (Non-Medical): No  Physical Activity: Not on file  Stress: No Stress Concern Present (07/15/2018)   [place]    Feeling of Stress : Only a little  Social Connections: Not on file  Intimate Partner Violence: Not on file    Physical Exam      Future Appointments  Date Time  Provider [place]  07/16/2022  1:45 PM MC-HVSC LAB MC-HVSC None  07/30/2022 11:40 AM Larey Dresser, MD MC-HVSC None  08/31/2022  8:00 AM MC-MR 1 MC-MRI [facility]  09/14/2022  2:10 PM [name], NP CHW-CHWW None  12/23/2022  8:20 AM [name], Denice Bors, MD CVD-NORTHLIN None       Renee Ramus, [facility] Paramedic  07/15/22   SOCIAL/MEDICAL BARRIERS:  [place]  PCP [place] Medicaid  MED ISSUES:  AFFORDABILITY No  PT ASSIST APPS NEEDED no  SOURCE OF INCOME none  TRANSPORTATION yes mother  FOOD INSECURITIES/NEEDS none FOOD STAMPS yes  REVIEWED DIET/FLUID/SALT RESTRICTIONS yes   RENT/OWN HOME ISSUES rent  SOCIAL SUPPORT  SAFETY/DOMESTIC ISSUES none  SUBSTANCE ABUSE none  DAILY WEIGHTS will start doing  EDUCATE ON DISEASE PROCESS/SYMPTOMS/PURPOSES OF MEDS YES

## 2022-07-16 ENCOUNTER — Telehealth (HOSPITAL_COMMUNITY): Payer: Self-pay

## 2022-07-16 ENCOUNTER — Ambulatory Visit (HOSPITAL_COMMUNITY)
Admission: RE | Admit: 2022-07-16 | Discharge: 2022-07-16 | Disposition: A | Discharge status: Home / Self Care | Payer: Medicaid Other | Source: Ambulatory Visit | Attending: Cardiology | Admitting: Cardiology

## 2022-07-16 ENCOUNTER — Other Ambulatory Visit: Payer: Self-pay

## 2022-07-16 ENCOUNTER — Other Ambulatory Visit (HOSPITAL_COMMUNITY): Payer: Self-pay

## 2022-07-16 DIAGNOSIS — I5032 Chronic diastolic (congestive) heart failure: Secondary | ICD-10-CM | POA: Insufficient documentation

## 2022-07-16 LAB — BASIC METABOLIC PANEL
Anion gap: 9 (ref 5–15)
BUN: 34 mg/dL — ABNORMAL HIGH (ref 6–20)
CO2: 30 mmol/L (ref 22–32)
Calcium: 9.7 mg/dL (ref 8.9–10.3)
Chloride: 99 mmol/L (ref 98–111)
Creatinine, Ser: 1.34 mg/dL — ABNORMAL HIGH (ref 0.44–1.00)
GFR, Estimated: 47 mL/min — ABNORMAL LOW (ref >60)
Glucose, Bld: 166 mg/dL — ABNORMAL HIGH (ref 70–99)
Potassium: 4.2 mmol/L (ref 3.5–5.1)
Sodium: 138 mmol/L (ref 135–145)

## 2022-07-16 NOTE — Telephone Encounter (Signed)
error 

## 2022-07-21 NOTE — Telephone Encounter (Signed)
Advanced Heart Failure Patient Advocate Encounter  Contacted [employer] for update.  Representative was unable to find any documentation that the app was received. Refaxed app to [facility], indexed copy of application to chart.  Clista Bernhardt, CPhT Rx Patient Advocate Phone: [phone]

## 2022-07-22 ENCOUNTER — Other Ambulatory Visit: Payer: Self-pay

## 2022-07-23 ENCOUNTER — Other Ambulatory Visit (HOSPITAL_COMMUNITY): Payer: Self-pay

## 2022-07-23 ENCOUNTER — Other Ambulatory Visit: Payer: Self-pay

## 2022-07-23 ENCOUNTER — Other Ambulatory Visit (HOSPITAL_COMMUNITY): Payer: Self-pay | Admitting: Emergency Medicine

## 2022-07-23 ENCOUNTER — Other Ambulatory Visit: Payer: Self-pay | Admitting: Nurse Practitioner

## 2022-07-23 DIAGNOSIS — F419 Anxiety disorder, unspecified: Secondary | ICD-10-CM

## 2022-07-23 DIAGNOSIS — E1169 Type 2 diabetes mellitus with other specified complication: Secondary | ICD-10-CM

## 2022-07-23 NOTE — Progress Notes (Signed)
Paramedicine Encounter    Patient ID: [name], female    DOB: 01/03/1966, 56 y.o.   MRN: [id number]   BP 120/80 (BP Location: Left Arm, Patient Position: Sitting, Cuff Size: Large)   Pulse 98   Wt (!) 304 lb 9.6 oz (138.2 kg)   SpO2 98%   BMI 47.71 kg/m  Weight yesterday-not taken Last visit weight-301lb Cbg 187  ATF [name] A&O x 4, skin W&D w/ good color.  Pt. Denies chest pain or SOB.  Her Lung sounds clear and equal bilat.  No edema noted.  Pt has done well taking her medications appropriately.  Her mother puts her pill box together.  Reviewed meds and Entresto PM dose had been left out so I added same to PM.  Pt is still waiting to get prescriptions for Metformin, Risperdol and Lexapro from Dr. Raul Del.   Pt has a CPAP machine that she says she isn't using.  She says, "it's broken AND I don' t like the mask."  Discussed the importance of using her CPAP machine.  I asked if she would consider getting her broken machine fixed but she was undecided. Pt has appt. 11/2 w/ Dr. Aundra Dubin.  I advised her to make sure she brings her meds with her with her pill box. Home visit complete.    Renee Ramus, [place] 07/24/2022    Patient Care Team: [name], NP as PCP - General (Nurse Practitioner) Lelon Perla, MD as PCP - Cardiology (Cardiology) Lelon Perla, MD (Cardiology) Pccm, Md, MD as Consulting Physician (Pulmonary Disease) Deberah Pelton, NP as Nurse Practitioner (Cardiology)  Patient Active Problem List   Diagnosis Date Noted   Essential hypertension 05/26/2022   Hypokalemia 05/26/2022   CHF exacerbation ([place]) 84/13/2440   Acute diastolic CHF (congestive heart failure) ([place]) 11/10/2021   Acute on chronic congestive heart failure ([place]) 04/18/2021   Acute on chronic heart failure ([place]) 04/17/2021   Elevated troponin    Obesity hypoventilation syndrome ([place]) 12/15/2019   Depression 12/15/2019   ILD (interstitial lung disease) ([place])  05/28/2019   Exertional angina 05/28/2019   AKI (acute kidney injury) ([place])    Hematochezia 07/14/2018   Acute posthemorrhagic anemia 07/14/2018   GIB (gastrointestinal bleeding) 07/14/2018   Persistent atrial fibrillation ([place])    Uncontrolled type 2 diabetes mellitus with hyperglycemia ([place])    Mycobacterium avium complex ([place]) 12/13/2015   Pyrexia    Dyspnea 11/13/2015   Mediastinal adenopathy 11/13/2015   Abnormal CT scan, chest 11/11/2015   Chest pain 11/11/2015   Depression (emotion) 03/07/2015   Noninfectious gastroenteritis and colitis 01/02/2015   Sinusitis, chronic 01/02/2015   Midline low back pain without sciatica 09/10/2014   Bipolar 1 disorder, mixed, moderate ([place]) 07/02/2014   Stress incontinence 07/02/2014   Mania ([place]) 12/10/2013   Speech abnormality 12/08/2013   SVT (supraventricular tachycardia) 12/06/2013   H/O non-insulin dependent diabetes mellitus 11/28/2013   Pulmonary HTN, moderate to severe 11/03/2013   Acute on chronic diastolic congestive heart failure ([place]) 11/02/2013   Hypertensive heart disease 10/18/2013   Non-insulin dependent type 2 diabetes mellitus ([place]) 10/18/2013   Chronic diastolic heart failure ([place]) 07/23/2011   Class 3 severe obesity due to excess calories with serious comorbidity and body mass index (BMI) of 60.0 to 69.9 in adult [facility]) 02/19/2011   Non-ST elevation (NSTEMI) myocardial infarction ([place]) 04/29/2009   Bipolar disorder     Current Outpatient Medications:    Accu-Chek Softclix Lancets lancets, Use up to  4 times daily, Disp: 100 each, Rfl: 0   Blood Glucose Monitoring Suppl (ACCU-CHEK GUIDE) w/Device KIT, Use to check blood sugar three times daily., Disp: 1 kit, Rfl: 0   carvedilol (COREG) 25 MG tablet, Take 1 tablet (25 mg total) by mouth 2 (two) times daily with a meal., Disp: 60 tablet, Rfl: 3   empagliflozin (JARDIANCE) 10 MG TABS tablet, Take 1 tablet (10 mg total) by mouth daily before breakfast., Disp: 30 tablet, Rfl:  6   glipiZIDE (GLUCOTROL XL) 5 MG 24 hr tablet, Take 1 tablet (5 mg total) by mouth daily with breakfast., Disp: 30 tablet, Rfl: 6   glucose blood (ACCU-CHEK GUIDE) test strip, Use as directed up to 4 times daily, Disp: 100 each, Rfl: 0   hydrALAZINE (APRESOLINE) 25 MG tablet, Take 1 tablet (25 mg total) by mouth 3 (three) times daily., Disp: 90 tablet, Rfl: 3   Incontinence Supply Disposable (INCONTINENCE BRIEF LARGE) MISC, Please provide patient with insurance approved incontinence supplies/briefs, Disp: 18 each, Rfl: 6   metFORMIN (GLUCOPHAGE) 1000 MG tablet, Take 1,000 mg by mouth 2 (two) times daily with a meal., Disp: , Rfl:    rivaroxaban (XARELTO) 20 MG TABS tablet, Take 1 tablet (20 mg total) by mouth daily with supper., Disp: 30 tablet, Rfl: 3   sacubitril-valsartan (ENTRESTO) 49-51 MG, Take 1 tablet by mouth 2 (two) times daily., Disp: 60 tablet, Rfl: 6   spironolactone (ALDACTONE) 25 MG tablet, Take 1 tablet (25 mg total) by mouth daily., Disp: 30 tablet, Rfl: 3   Blood Pressure Monitor DEVI, Please provide patient with insurance approved blood pressure monitor I10.0 (Patient not taking: Reported on 07/15/2022), Disp: 1 each, Rfl: 0   escitalopram (LEXAPRO) 20 MG tablet, Take 1 tablet (20 mg total) by mouth every morning. (Patient not taking: Reported on 07/15/2022), Disp: 30 tablet, Rfl: 0   fluticasone (FLONASE) 50 MCG/ACT nasal spray, Place 2 sprays into both nostrils daily. (Patient not taking: Reported on 07/15/2022), Disp: 16 g, Rfl: 6   hydrOXYzine (VISTARIL) 25 MG capsule, Take 1 capsule (25 mg total) by mouth every 8 (eight) hours as needed., Disp: 60 capsule, Rfl: 1   Naphazoline-Pheniramine (EYE ALLERGY RELIEF OP), Place 1 drop into both eyes daily as needed (allergy). (Patient not taking: Reported on 07/15/2022), Disp: , Rfl:    risperiDONE (RISPERDAL) 2 MG tablet, Take 1 tablet (2 mg total) by mouth at bedtime. (Patient not taking: Reported on 07/15/2022), Disp: 30 tablet, Rfl:  0   solifenacin (VESICARE) 10 MG tablet, Take 10 mg by mouth daily. (Patient not taking: Reported on 07/15/2022), Disp: , Rfl:    torsemide (DEMADEX) 20 MG tablet, Take 2 tablets (40 mg total) by mouth 2 (two) times daily., Disp: 360 tablet, Rfl: 0 Allergies  Allergen Reactions   Acetaminophen Other (See Comments)    Seizure-like "fits" as a child   Caffeine     Tense, anxiety, increased urination   [place] [Dapagliflozin] Other (See Comments)    Hallucinations, drop in blood sugar   Lisinopril Rash    Rash with lisinopril; but fosinopril is ok per patient      Social History   Socioeconomic History   Marital status: Single    Spouse name: Not on file   Number of children: 0   Years of education: 18   Highest education level: Not on file  Occupational History   Occupation: unemployed  Tobacco Use   Smoking status: Never   Smokeless tobacco: Never  Vaping  Use   Vaping Use: Never used  Substance and Sexual Activity   Alcohol use: No   Drug use: No   Sexual activity: Not Currently    Birth control/protection: None  Other Topics Concern   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 But ran out of money and is no longer attending school. (Note patient has bipolar disorder).      Born in [place] but lived in [place] before coming back to [place].       Primary language is Arabic. Lives with mother and brother.   Social Determinants of Health   Financial Resource Strain: Low Risk  (05/26/2022)   Overall Financial Resource Strain (CARDIA)    Difficulty of Paying Living Expenses: Not very hard  Food Insecurity: No Food Insecurity (05/26/2022)   Hunger Vital Sign    Worried About Running Out of Food in the Last Year: Never true    Ran Out of Food in the Last Year: Never true  Transportation Needs: No Transportation Needs (05/26/2022)   PRAPARE - Radiographer, therapeutic (Medical): No    Lack of Transportation (Non-Medical): No  Physical Activity: Not on file  Stress: No Stress Concern Present (07/15/2018)   [place]    Feeling of Stress : Only a little  Social Connections: Not on file  Intimate Partner Violence: Not on file    Physical Exam      Future Appointments  Date Time Provider [place]  07/30/2022 11:40 AM Larey Dresser, MD MC-HVSC None  08/31/2022  8:00 AM MC-MR 1 MC-MRI [facility]  09/14/2022  2:10 PM Ladell Pier, MD CHW-CHWW None  12/23/2022  8:20 AM [name] Denice Bors, MD CVD-[facility] None       Renee Ramus, [place] [facility] Paramedic  07/24/22

## 2022-07-24 ENCOUNTER — Other Ambulatory Visit: Payer: Self-pay

## 2022-07-26 MED ORDER — METFORMIN HCL 1000 MG PO TABS
1000.0000 mg | ORAL_TABLET | Freq: Two times a day (BID) | ORAL | 0 refills | Status: DC
  Filled 2022-07-26: qty 60, 30d supply, fill #0

## 2022-07-26 MED ORDER — ESCITALOPRAM OXALATE 20 MG PO TABS
20.0000 mg | ORAL_TABLET | Freq: Every morning | ORAL | 0 refills | Status: DC
  Filled 2022-07-26: qty 30, 30d supply, fill #0

## 2022-07-26 MED ORDER — RISPERIDONE 2 MG PO TABS
2.0000 mg | ORAL_TABLET | Freq: Every day | ORAL | 0 refills | Status: DC
  Filled 2022-07-26: qty 30, 30d supply, fill #0

## 2022-07-27 ENCOUNTER — Other Ambulatory Visit: Payer: Self-pay

## 2022-07-30 ENCOUNTER — Ambulatory Visit (HOSPITAL_COMMUNITY)
Admission: RE | Admit: 2022-07-30 | Discharge: 2022-07-30 | Disposition: A | Discharge status: Home / Self Care | Payer: Medicaid Other | Source: Ambulatory Visit | Attending: Cardiology | Admitting: Cardiology

## 2022-07-30 ENCOUNTER — Other Ambulatory Visit (HOSPITAL_COMMUNITY): Payer: Self-pay | Admitting: Emergency Medicine

## 2022-07-30 ENCOUNTER — Other Ambulatory Visit: Payer: Self-pay

## 2022-07-30 ENCOUNTER — Encounter (HOSPITAL_COMMUNITY): Payer: Self-pay | Admitting: Cardiology

## 2022-07-30 VITALS — BP 88/50 | HR 72

## 2022-07-30 DIAGNOSIS — E1165 Type 2 diabetes mellitus with hyperglycemia: Secondary | ICD-10-CM | POA: Insufficient documentation

## 2022-07-30 DIAGNOSIS — Q245 Malformation of coronary vessels: Secondary | ICD-10-CM | POA: Insufficient documentation

## 2022-07-30 DIAGNOSIS — I4819 Other persistent atrial fibrillation: Secondary | ICD-10-CM

## 2022-07-30 DIAGNOSIS — Z7901 Long term (current) use of anticoagulants: Secondary | ICD-10-CM | POA: Insufficient documentation

## 2022-07-30 DIAGNOSIS — R197 Diarrhea, unspecified: Secondary | ICD-10-CM | POA: Insufficient documentation

## 2022-07-30 DIAGNOSIS — Z7984 Long term (current) use of oral hypoglycemic drugs: Secondary | ICD-10-CM | POA: Insufficient documentation

## 2022-07-30 DIAGNOSIS — Z91199 Patient's noncompliance with other medical treatment and regimen due to unspecified reason: Secondary | ICD-10-CM | POA: Insufficient documentation

## 2022-07-30 DIAGNOSIS — I5033 Acute on chronic diastolic (congestive) heart failure: Secondary | ICD-10-CM | POA: Insufficient documentation

## 2022-07-30 DIAGNOSIS — I5032 Chronic diastolic (congestive) heart failure: Secondary | ICD-10-CM

## 2022-07-30 DIAGNOSIS — G4733 Obstructive sleep apnea (adult) (pediatric): Secondary | ICD-10-CM | POA: Insufficient documentation

## 2022-07-30 DIAGNOSIS — Z79899 Other long term (current) drug therapy: Secondary | ICD-10-CM | POA: Insufficient documentation

## 2022-07-30 DIAGNOSIS — I110 Hypertensive heart disease with heart failure: Secondary | ICD-10-CM | POA: Insufficient documentation

## 2022-07-30 LAB — CBC
HCT: 43.1 % (ref 36.0–46.0)
Hemoglobin: 13.5 g/dL (ref 12.0–15.0)
MCH: 27.1 pg (ref 26.0–34.0)
MCHC: 31.3 g/dL (ref 30.0–36.0)
MCV: 86.4 fL (ref 80.0–100.0)
Platelets: 235 K/uL (ref 150–400)
RBC: 4.99 MIL/uL (ref 3.87–5.11)
RDW: 15.3 % (ref 11.5–15.5)
WBC: 4.9 K/uL (ref 4.0–10.5)
nRBC: 0 % (ref 0.0–0.2)

## 2022-07-30 LAB — BASIC METABOLIC PANEL WITH GFR
Anion gap: 15 (ref 5–15)
BUN: 42 mg/dL — ABNORMAL HIGH (ref 6–20)
CO2: 26 mmol/L (ref 22–32)
Calcium: 9.6 mg/dL (ref 8.9–10.3)
Chloride: 97 mmol/L — ABNORMAL LOW (ref 98–111)
Creatinine, Ser: 1.4 mg/dL — ABNORMAL HIGH (ref 0.44–1.00)
GFR, Estimated: 45 mL/min — ABNORMAL LOW
Glucose, Bld: 152 mg/dL — ABNORMAL HIGH (ref 70–99)
Potassium: 4.1 mmol/L (ref 3.5–5.1)
Sodium: 138 mmol/L (ref 135–145)

## 2022-07-30 LAB — BRAIN NATRIURETIC PEPTIDE: B Natriuretic Peptide: 345.8 pg/mL — ABNORMAL HIGH (ref 0.0–100.0)

## 2022-07-30 NOTE — Patient Instructions (Signed)
HOLD Hydralazine till diarrhea stops, then restart.  Labs done today, your results will be available in MyChart, we will contact you for abnormal readings.  Genetic test has been done, this has to be sent to [place] to be processed and can take 1-2 weeks to get results back.  We will let you know the results.  Your physician recommends that you schedule a follow-up appointment in: 1 month.  If you have any questions or concerns before your next appointment please send [place] a message through [place] or call our office at [phone].    TO LEAVE A MESSAGE FOR THE NURSE SELECT OPTION 2, PLEASE LEAVE A MESSAGE INCLUDING: YOUR NAME DATE OF BIRTH CALL BACK NUMBER REASON FOR CALL**this is important as we prioritize the call backs  YOU WILL RECEIVE A CALL BACK THE SAME DAY AS LONG AS YOU CALL BEFORE 4:00 PM  At the [facility], you and your health needs are our priority. As part of our continuing mission to provide you with exceptional heart care, we have created designated Provider Care Teams. These Care Teams include your primary Cardiologist (physician) and Advanced Practice Providers (APPs- Physician Assistants and Nurse Practitioners) who all work together to provide you with the care you need, when you need it.   You may see any of the following providers on your designated Care Team at your next follow up: Dr Glori Bickers Dr Loralie Champagne Dr. Roxana Hires, NP Lyda Jester, [place] [facility] [place], [place] Forestine Na, NP Audry Riles, PharmD   Please be sure to bring in all your medications bottles to every appointment.

## 2022-07-30 NOTE — Progress Notes (Addendum)
[facility]  Note  PCP: [name], NP HF Cardiologist: Dr. Aundra Dubin  HPI: [name] is a 56 y.o.female with a history of chronic diastolic heart failure, persistent atrial fibrillation on Xarelto, hypertension, obesity and OSA, not compliant w/ CPAP.  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  She had VFib with heart cath, treated w/ defibrillation to NSR. She also developed AKI after cath.  Echo 04/18/21 with EF 60-65%, no RWMA, severe LVH, RV normal with moderately elevated PA systolic pressure, Lt atrial size severely dilated and RA mildly dilated.  Mild MR, mod to severe TR.    Admitted 7/34/19 for a/c diastolic CHF w/ fluid overload. Afib well rate controlled. Diuresed w/ IV Lasix, then transitioned to GDMT. Discharge home, weight 328 lbs.  Admitted 05/25/22 with acute on chronic HFpEF. Ran out her medications while traveling out of the country. Diuresed with IV lasix and transitioned to torsemide 40 mg bid. Echo showed LVEF 60-65%, severe LVH, septal thickenness over 20 mm but difficult to measure, RV normal.  Discharge weight 308 pounds.   Seen in TOC 9/26/3, volume up and only taking torsemide once a day and off SGLT2i.  Torsemide increased to 40 mg bid. Jardiance restarted, hydralazine restarted (was not taking at home). Follow up 06/10/22, she remained volume overloaded. She had stopped Jardiance, so this was restarted. Losartan stopped and Entresto 24/26 started.  Follow up 06/29/22, NYHA IIIb symptoms and appeared volume overloaded. Instructed she take metolazone 2.5 mg and continue torsemide 40 bid.   She returns for HF followup today.  She has been having frequent diarrhea today and yesterday.  She is nauseated.  She has not had recent chest pain. She is short of breath/fatigued after walking 100 feet.  No dyspnea walking in her house.  She  sleeps sitting up on the sofa.  She has sleep apnea but cannot tolerate CPAP.  BP is unusually low today, generally SBP 130s-140s.    Labs (7/23): Myeloma panel negative Labs (9/23): K 4.2, creatinine 1.36 Labs (10/23): K 4.2, creatinine 1.34, BNP 455  ECG (9/23, personally reviewed): Atrial fibrillation, LPFB  PMH: 1. Atrial fibrillation: Chronic since 2019.  2. HTN 3. OSA: Unable to tolerate CPAP.  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   - Cardiolite (8/17): No ischemia 5. Chronic diastolic CHF: Echo (3/79): EF 60-65%, severe LVH, Septal thickenness over 85m but difficult to measure, RV normal.   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 EF >65%.  - Chest CT 2019 Prominent mediastinal lymph nodes, likely reactive. Lungs/Pleura: +Calcified granulomas. 6. Bipolar disorder 7. 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 But ran out of money and is no longer attending school. (Note patient has bipolar disorder).      Born in [place] but lived in [place] before coming back to [place].       Primary language is Arabic. Lives with mother and brother.   Social Determinants of Health   Financial Resource Strain: Low Risk  (05/26/2022)   Overall Financial Resource Strain (CARDIA)     Difficulty of Paying Living Expenses: Not very hard  Food Insecurity: No Food Insecurity (05/26/2022)   Hunger Vital Sign    Worried About Running Out of Food in the Last Year: Never true    Ran Out of Food in the Last Year: Never true  Transportation Needs: No Transportation Needs (05/26/2022)   PRAPARE - Hydrologist (Medical): No    Lack of Transportation (Non-Medical): No  Physical Activity: Not on file  Stress: No Stress Concern Present (07/15/2018)   [place]    Feeling of Stress : Only a little  Social Connections: Not on file  Intimate Partner Violence: Not on file   FH:  Brother with sudden cardiac death, father with CVA  Current Outpatient Medications  Medication Sig Dispense Refill   Accu-Chek Softclix Lancets lancets Use up to 4 times daily 100 each 0   amLODipine (NORVASC) 10 MG tablet Take 10 mg by mouth daily.     Blood Glucose Monitoring Suppl (ACCU-CHEK GUIDE) w/Device KIT Use to check blood sugar three times daily. 1 kit 0   Blood Pressure Monitor DEVI Please provide patient with insurance approved blood pressure monitor I10.0 1 each 0   carvedilol (COREG) 25 MG tablet Take 1 tablet (25 mg total) by mouth 2 (two) times daily with a meal. 60 tablet 3   empagliflozin (JARDIANCE) 10 MG TABS tablet Take 1 tablet (10 mg total) by mouth daily before breakfast. 30 tablet 6   glipiZIDE (GLUCOTROL XL) 5 MG 24 hr tablet Take 5 mg by mouth daily with breakfast.     glucose blood (ACCU-CHEK GUIDE) test strip Use as directed up to 4 times daily 100 each 0   hydrALAZINE (APRESOLINE) 25 MG tablet Take 1 tablet (25 mg total) by mouth 3 (three) times daily. (Patient not taking: Reported on 07/30/2022) 90 tablet 3   hydrOXYzine (VISTARIL) 25 MG capsule Take 1 capsule (25 mg total) by mouth every 8 (eight) hours as needed. 60 capsule 1   Incontinence Supply Disposable (INCONTINENCE BRIEF LARGE)  MISC Please provide patient with insurance approved incontinence supplies/briefs 18 each 6   metFORMIN (GLUCOPHAGE) 1000 MG tablet Take 1,000 mg by mouth 2 (two) times daily with a meal.     metFORMIN (GLUCOPHAGE) 1000 MG tablet Take 1 tablet (1,000 mg total) by mouth 2 (two) times daily with a meal. 60 tablet 0   Naphazoline-Pheniramine (EYE ALLERGY RELIEF OP) Place 1 drop into both eyes daily as needed (allergy).     risperiDONE (RISPERDAL) 2 MG tablet Take 1 tablet (2 mg total) by mouth at bedtime. 30 tablet 0   rivaroxaban (XARELTO) 20 MG TABS tablet Take 1 tablet (20 mg total) by mouth daily with supper. 30 tablet 3   sacubitril-valsartan (ENTRESTO) 49-51 MG Take 1 tablet by mouth 2 (two) times daily. 60 tablet 6  spironolactone (ALDACTONE) 25 MG tablet Take 1 tablet (25 mg total) by mouth daily. 30 tablet 3   torsemide (DEMADEX) 20 MG tablet Take 2 tablets (40 mg total) by mouth 2 (two) times daily. 360 tablet 0   No current facility-administered medications for this encounter.   BP (!) 88/50   Pulse 72   SpO2 97%   Wt Readings from Last 3 Encounters:  07/23/22 (!) 138.2 kg (304 lb 9.6 oz)  07/15/22 (!) 136.5 kg (301 lb)  07/08/22 (!) 144 kg (317 lb 6.4 oz)   PHYSICAL EXAM: General: NAD, obese.  Neck: No JVD, no thyromegaly or thyroid nodule.  Lungs: Clear to auscultation bilaterally with normal respiratory effort. CV: Nondisplaced PMI.  Heart irregular S1/S2, no S3/S4, no murmur.  No peripheral edema.  No carotid bruit.  Normal pedal pulses.  Abdomen: Soft, nontender, no hepatosplenomegaly, no distention.  Skin: Intact without lesions or rashes.  Neurologic: Alert and oriented x 3.  Psych: Normal affect. Extremities: No clubbing or cyanosis.  HEENT: Normal.   ASSESSMENT & PLAN: 1. Chronic diastolic CHF:  Echo (3/90) with EF 60-65%, severe LVH, Septal thickenness over 88m but difficult to measure, RV normal.   Severe LVH may be secondary to HTN, but also concerned for  possible hypertrophic cardiomyopathy (less likely cardiac amyloidosis).  Father with sudden death is concerning for familial HCM.  She has been admitted several times in the last year or so with CHF exacerbation.  She is not always compliant with meds, has paramedicine following.  NYHA class III chronically, not volume overloaded on exam.  - Continue torsemide 40 mg bid, this dose appears adequate. BMET/BNP today.  - Continue Jardiance 10 mg daily.  - Continue Entresto 49/51 bid. Vc                                                                                                                                                                                       - Continue spironolactone 25 mg daily  - Continue carvedilol 25 mg bid. - Need to rule out infiltrative disease, cardiac MRI is scheduled for 12/23.  I will also send [facility] gene testing for cardiomyopathies (specifically to look for HCM mutations).  - I recommended Cardiomems placement, long discussion about risks/benefits.  She says that she will think about it.  2. Hypertension: Appears generally controlled.  With diarrhea and low blood pressure today.  - Hold hydralazine until diarrhea has resolved.  3. Atrial fibrillation: Chronic since at least 2019.  Unlikely to cardiovert and stay in NSR at this point given chronicity and noncompliance with CPAP.  Rate controlled.  - Continue Coreg - Continue Xarelto.  CBC today.  4. OSA: Not compliant w/ CPAP. Urged her to try it again.  5. Morbid Obesity: At next appointment, will discuss referral for semaglutide.  6. DM II : Poor control.  7. Anomalous RCA: Off the left cusp, appears to try interarterially between aorta and PA.  Cardiolite in 2017 showed no ischemia.  With an anomalous right, no attributable symptoms (no chest pain), and normal Cardiolite in past, would medically manage this.   Followup 1 month with APP.   [name]  07/30/2022

## 2022-07-30 NOTE — Progress Notes (Signed)
Paramedicine Encounter   Patient ID: Modene Andy , female,   DOB: 1966-01-28,56 y.o.,  MRN: [id number]   Met patient in clinic today with provider.  Time spent with patient 45 minutes  Ms. Cayabyab was quite late for her appointment at the clinic today.  She was A&O x 4, skin W&D w/ good color.  She says her head's stopped up and her nose is running today. She is afebrile.  She reports to have diarrhea that began yesterday.  The only med changes today from Dr. Aundra Dubin is to hold Hydralazine until her diarrhea subsides.  Dr. Aundra Dubin also discussed with her being a good candidate for Cardomems.  He explained how it works and the benefits of the device.  She says she needs time to consider it. Med box reconciled and next home visit scheduled for 11/9 _0 :00.    Renee Ramus, [place] 07/30/2022

## 2022-07-30 NOTE — Progress Notes (Signed)
Blood collected for TTR genetic testing per Dr McLean.  Order form completed and both shipped by FedEx to [facility].  

## 2022-07-31 ENCOUNTER — Other Ambulatory Visit: Payer: Self-pay | Admitting: Nurse Practitioner

## 2022-07-31 NOTE — Telephone Encounter (Addendum)
Advanced Heart Failure Patient Advocate Encounter   Called [facility] for update, application was reprocessed today.  Patient was approved to receive Jardiance from [facility] Effective dates: 07/22/2022 through 07/22/2023 Determination letter has been added to patient chart.  Delivery should process on Monday 08/03/22 and ship out by Wednesday 08/05/22. Attempted to call patient with update, no answer, no voice mail box set up.

## 2022-08-05 ENCOUNTER — Telehealth (HOSPITAL_COMMUNITY): Payer: Self-pay | Admitting: Licensed Clinical Social Worker

## 2022-08-05 NOTE — Telephone Encounter (Signed)
HF Paramedicine Team Based Care Meeting  HF MD- NA  HF NP - Joseph NP-C   [facility] admit within the last 30 days for heart failure? no  Medications concerns? Pt mom had been doing pillbox with some errors   Eligible for discharge? Pt is relatively new and wanting to continue to follow her to ensure compliance and avoid readmission.  Jorge Ny, LCSW Clinical Social Worker Advanced Heart Failure Clinic Desk#: [phone] Cell#: [phone]

## 2022-08-06 ENCOUNTER — Telehealth (HOSPITAL_COMMUNITY): Payer: Self-pay

## 2022-08-06 ENCOUNTER — Other Ambulatory Visit (HOSPITAL_COMMUNITY): Payer: Self-pay | Admitting: Emergency Medicine

## 2022-08-06 ENCOUNTER — Other Ambulatory Visit: Payer: Self-pay

## 2022-08-06 ENCOUNTER — Other Ambulatory Visit (HOSPITAL_COMMUNITY): Payer: Self-pay

## 2022-08-06 DIAGNOSIS — I5032 Chronic diastolic (congestive) heart failure: Secondary | ICD-10-CM

## 2022-08-06 MED ORDER — ENTRESTO 49-51 MG PO TABS
1.0000 | ORAL_TABLET | Freq: Two times a day (BID) | ORAL | 5 refills | Status: DC
  Filled 2022-08-06: qty 60, 30d supply, fill #0

## 2022-08-06 MED ORDER — TORSEMIDE 20 MG PO TABS
ORAL_TABLET | ORAL | 0 refills | Status: DC
  Filled 2022-08-06: qty 70, 28d supply, fill #0
  Filled 2022-08-06: qty 360, fill #0
  Filled 2022-09-04: qty 70, 28d supply, fill #1
  Filled 2022-11-10 – 2022-11-17 (×2): qty 70, 28d supply, fill #2

## 2022-08-06 MED ORDER — TORSEMIDE 20 MG PO TABS
40.0000 mg | ORAL_TABLET | Freq: Two times a day (BID) | ORAL | 0 refills | Status: DC
  Filled 2022-08-06: qty 120, 30d supply, fill #0

## 2022-08-06 NOTE — Progress Notes (Unsigned)
Paramedicine Encounter    Patient ID: [name], female    DOB: 1966-08-02, 56 y.o.   MRN: [id number]   Wt (!) 314 lb (142.4 kg)   BMI 49.18 kg/m  Weight yesterday-not taken Last visit weight-304.9lb    Patient Care Team: [name], NP as PCP - General (Nurse Practitioner) Lelon Perla, MD as PCP - Cardiology (Cardiology) [name] Denice Bors, MD (Cardiology) Pccm, Md, MD as Consulting Physician (Pulmonary Disease) Marilynn Rail Jossie Ng, NP as Nurse Practitioner (Cardiology)  Patient Active Problem List   Diagnosis Date Noted  . Essential hypertension 05/26/2022  . Hypokalemia 05/26/2022  . CHF exacerbation ([place]) 05/25/2022  . Acute diastolic CHF (congestive heart failure) ([place]) 11/10/2021  . Acute on chronic congestive heart failure ([place]) 04/18/2021  . Acute on chronic heart failure ([place]) 04/17/2021  . Elevated troponin   . Obesity hypoventilation syndrome ([place]) 12/15/2019  . Depression 12/15/2019  . ILD (interstitial lung disease) ([place]) 05/28/2019  . Exertional angina 05/28/2019  . AKI (acute kidney injury) ([place])   . Hematochezia 07/14/2018  . Acute posthemorrhagic anemia 07/14/2018  . GIB (gastrointestinal bleeding) 07/14/2018  . Persistent atrial fibrillation ([place])   . Uncontrolled type 2 diabetes mellitus with hyperglycemia ([place])   . 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 Pyrexia   . Dyspnea 11/13/2015  . Mediastinal adenopathy 11/13/2015  . Abnormal CT scan, chest 11/11/2015  . Chest pain 11/11/2015  . Depression (emotion) 03/07/2015  . Noninfectious gastroenteritis and colitis 01/02/2015  . Sinusitis, chronic 01/02/2015  . Midline low back pain without sciatica 09/10/2014  . Bipolar 1 disorder, mixed, moderate ([place]) 07/02/2014  . Stress incontinence 07/02/2014  . Mania ([place]) 12/10/2013  . Speech abnormality 12/08/2013  . SVT (supraventricular tachycardia) 12/06/2013  . H/O non-insulin dependent diabetes mellitus 11/28/2013  . Pulmonary  HTN, moderate to severe 11/03/2013  . Acute on chronic diastolic congestive heart failure ([place]) 11/02/2013  . Hypertensive heart disease 10/18/2013  . Non-insulin dependent type 2 diabetes mellitus ([place]) 10/18/2013  . Chronic diastolic heart failure ([place]) 07/23/2011  . Class 3 severe obesity due to excess calories with serious comorbidity and body mass index (BMI) of 60.0 to 69.9 in adult ([place]) 02/19/2011  . Non-ST elevation (NSTEMI) myocardial infarction ([place]) 04/29/2009  . Bipolar disorder     Current Outpatient Medications:  .  Accu-Chek Softclix Lancets lancets, Use up to 4 times daily, Disp: 100 each, Rfl: 0 .  amLODipine (NORVASC) 10 MG tablet, Take 10 mg by mouth daily., Disp: , Rfl:  .  Blood Glucose Monitoring Suppl (ACCU-CHEK GUIDE) w/Device KIT, Use to check blood sugar three times daily., Disp: 1 kit, Rfl: 0 .  carvedilol (COREG) 25 MG tablet, Take 1 tablet (25 mg total) by mouth 2 (two) times daily with a meal., Disp: 60 tablet, Rfl: 3 .  empagliflozin (JARDIANCE) 10 MG TABS tablet, Take 1 tablet (10 mg total) by mouth daily before breakfast., Disp: 30 tablet, Rfl: 6 .  glipiZIDE (GLUCOTROL XL) 5 MG 24 hr tablet, Take 5 mg by mouth daily with breakfast., Disp: , Rfl:  .  glucose blood (ACCU-CHEK GUIDE) test strip, Use as directed up to 4 times daily, Disp: 100 each, Rfl: 0 .  hydrALAZINE (APRESOLINE) 25 MG tablet, Take 1 tablet (25 mg total) by mouth 3 (three) times daily., Disp: 90 tablet, Rfl: 3 .  hydrOXYzine (VISTARIL) 25 MG capsule, Take 1 capsule (25 mg total) by mouth every 8 (eight) hours as needed., Disp: 60 capsule, Rfl: 1 .  Incontinence Supply Disposable (INCONTINENCE BRIEF LARGE) MISC, Please provide patient with insurance approved incontinence supplies/briefs, Disp: 18 each, Rfl: 6 .  metFORMIN (GLUCOPHAGE) 1000 MG tablet, Take 1 tablet (1,000 mg total) by mouth 2 (two) times daily with a meal., Disp: 60 tablet, Rfl: 0 .  risperiDONE (RISPERDAL) 2 MG tablet, Take 1  tablet (2 mg total) by mouth at bedtime., Disp: 30 tablet, Rfl: 0 .  rivaroxaban (XARELTO) 20 MG TABS tablet, Take 1 tablet (20 mg total) by mouth daily with supper., Disp: 30 tablet, Rfl: 3 .  sacubitril-valsartan (ENTRESTO) 49-51 MG, Take 1 tablet by mouth 2 (two) times daily., Disp: 60 tablet, Rfl: 6 .  spironolactone (ALDACTONE) 25 MG tablet, Take 1 tablet (25 mg total) by mouth daily., Disp: 30 tablet, Rfl: 3 .  torsemide (DEMADEX) 20 MG tablet, Take 2 tablets (40 mg total) by mouth 2 (two) times daily., Disp: 360 tablet, Rfl: 0 .  Blood Pressure Monitor DEVI, Please provide patient with insurance approved blood pressure monitor I10.0 (Patient not taking: Reported on 08/06/2022), Disp: 1 each, Rfl: 0 .  metFORMIN (GLUCOPHAGE) 1000 MG tablet, Take 1,000 mg by mouth 2 (two) times daily with a meal., Disp: , Rfl:  .  Naphazoline-Pheniramine (EYE ALLERGY RELIEF OP), Place 1 drop into both eyes daily as needed (allergy). (Patient not taking: Reported on 08/06/2022), Disp: , Rfl:  Allergies  Allergen Reactions  . Acetaminophen Other (See Comments)    Seizure-like "fits" as a child  . Caffeine     Tense, anxiety, increased urination  . [place] [Dapagliflozin] Other (See Comments)    Hallucinations, drop in blood sugar  . Lisinopril Rash    Rash with lisinopril; but fosinopril is ok per patient      Social History   Socioeconomic History  . Marital status: Single    Spouse name: Not on file  . Number of children: 0  . Years of education: 33  . Highest education level: Not on file  Occupational History  . Occupation: unemployed  Tobacco Use  . Smoking status: Never  . Smokeless tobacco: Never  Vaping Use  . Vaping Use: Never used  Substance and Sexual Activity  . Alcohol use: No  . Drug use: No  . Sexual activity: Not Currently    Birth control/protection: None  Other Topics Concern  . 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 But ran out of money and is no longer attending school. (Note patient has bipolar disorder).      Born in [place] but lived in [place] before coming back to [place].       Primary language is Arabic. Lives with mother and brother.   Social Determinants of Health   Financial Resource Strain: Low Risk  (05/26/2022)   Overall Financial Resource Strain (CARDIA)   . Difficulty of Paying Living Expenses: Not very hard  Food Insecurity: No Food Insecurity (05/26/2022)   Hunger Vital Sign   . Worried About Charity fundraiser in the Last Year: Never true   . Ran Out of Food in the Last Year: Never true  Transportation Needs: No Transportation Needs (05/26/2022)   PRAPARE - Transportation   . Lack of Transportation (Medical): No   . Lack of Transportation (Non-Medical): No  Physical Activity: Not on file  Stress: No Stress Concern Present (07/15/2018)   [place]  [employer]   . Feeling of Stress : Only a little  Social Connections: Not on file  Intimate Partner Violence: Not on file    Physical Exam      Future Appointments  Date Time Provider [place]  08/31/2022  8:00 AM [facility]-MR 1 [facility]  09/03/2022  1:30 PM [facility] PA/NP [facility] None  09/14/2022  2:10 PM Ladell Pier, MD [facility] None  12/23/2022  8:20 AM [name] Denice Bors, MD [facility] None       Renee Ramus, [place] [facility] Paramedic  08/06/22

## 2022-08-06 NOTE — Telephone Encounter (Addendum)
[place] paramedic called to report that the patients weight was up 10 pounds. She reports patient looked ok but wondered if she was retaining fluid in her stomach. Per Dr. Aundra Dubin increase patients Torsemide to '60mg'$  BID ofr 3 days then start Torsemide '60mg'$  QD alternating with '40mg'$  QD. Bmet in 1 week. Dede advised and verbalized understanding. Med list updated to reflect changes, lab order enter and appointment scheduled.   Meds ordered this encounter  Medications   torsemide (DEMADEX) 20 MG tablet    Sig: Take '60mg'$  daily alternating with '40mg'$  daily.    Dispense:  360 tablet    Refill:  0    Please cancel all previous orders for current medication. Change in dosage or pill size.  HF fund   Orders Placed This Encounter  Procedures   Basic metabolic panel    Standing Status:   Future    Standing Expiration Date:   08/07/2023    Order Specific Question:   Release to patient    Answer:   Immediate    Order Specific Question:   Release to patient    Answer:   Immediate [1]

## 2022-08-06 NOTE — Telephone Encounter (Signed)
Meds ordered this encounter  Medications   torsemide (DEMADEX) 20 MG tablet    Sig: Take 2 tablets (40 mg total) by mouth 2 (two) times daily.    Dispense:  360 tablet    Refill:  0    HF fund   sacubitril-valsartan (ENTRESTO) 49-51 MG    Sig: Take 1 tablet by mouth 2 (two) times daily.    Dispense:  60 tablet    Refill:  5    HF Fund

## 2022-08-07 ENCOUNTER — Other Ambulatory Visit (HOSPITAL_COMMUNITY): Payer: Self-pay

## 2022-08-12 ENCOUNTER — Other Ambulatory Visit: Payer: Self-pay

## 2022-08-12 ENCOUNTER — Other Ambulatory Visit (HOSPITAL_COMMUNITY): Payer: Self-pay

## 2022-08-13 ENCOUNTER — Other Ambulatory Visit (HOSPITAL_COMMUNITY): Payer: Self-pay | Admitting: Emergency Medicine

## 2022-08-13 ENCOUNTER — Other Ambulatory Visit: Payer: Self-pay

## 2022-08-13 ENCOUNTER — Ambulatory Visit (HOSPITAL_COMMUNITY)
Admission: RE | Admit: 2022-08-13 | Discharge: 2022-08-13 | Disposition: A | Discharge status: Home / Self Care | Payer: Medicaid Other | Source: Ambulatory Visit | Attending: Internal Medicine | Admitting: Internal Medicine

## 2022-08-13 DIAGNOSIS — I5032 Chronic diastolic (congestive) heart failure: Secondary | ICD-10-CM | POA: Insufficient documentation

## 2022-08-13 LAB — BASIC METABOLIC PANEL
Anion gap: 11 (ref 5–15)
BUN: 46 mg/dL — ABNORMAL HIGH (ref 6–20)
CO2: 28 mmol/L (ref 22–32)
Calcium: 9.7 mg/dL (ref 8.9–10.3)
Chloride: 97 mmol/L — ABNORMAL LOW (ref 98–111)
Creatinine, Ser: 1.76 mg/dL — ABNORMAL HIGH (ref 0.44–1.00)
GFR, Estimated: 34 mL/min — ABNORMAL LOW (ref >60)
Glucose, Bld: 169 mg/dL — ABNORMAL HIGH (ref 70–99)
Potassium: 4.8 mmol/L (ref 3.5–5.1)
Sodium: 136 mmol/L (ref 135–145)

## 2022-08-13 NOTE — Addendum Note (Signed)
Encounter addended by: Stanford Scotland, RN on: 08/13/2022 4:20 PM  Actions taken: Clinical Note Signed

## 2022-08-13 NOTE — Progress Notes (Signed)
Medication Samples have been provided to the patient.  Drug name: Delene Loll       Strength: 49/51 mg        Qty: 2  LOT: [id number]  Exp.Date: 04/2024  Dosing instructions: Take 1 tablet Twice daily   The patient has been instructed regarding the correct time, dose, and frequency of taking this medication, including desired effects and most common side effects.   [name] [name] Round 4:18 PM 08/13/2022

## 2022-08-14 ENCOUNTER — Telehealth (HOSPITAL_COMMUNITY): Payer: Self-pay | Admitting: Emergency Medicine

## 2022-08-14 NOTE — Telephone Encounter (Signed)
Spoke with [place] mother and advised her of med change for Torsemide.  Should be '40mg'$   (2 tablets) in the morning and '20mg'$  (1 tablet) in the evening.  Mother states she understood same and would make the change.    Renee Ramus, [place] 08/14/2022

## 2022-08-17 NOTE — Progress Notes (Signed)
Med reconciliation only today. Pill box reconciled x 1 week.  I asked her if she felt comfortable filling her own pill box and she said, "yes  no."  I asked her which is it and she said, "I don't know."   Her mother does a lot for her and it appears she takes very little initiative in do things for herself.  I am encouraging her to participate more in her own care. Visit complete.    Renee Ramus, [place] 08/17/2022

## 2022-08-19 ENCOUNTER — Telehealth (HOSPITAL_COMMUNITY): Payer: Self-pay

## 2022-08-19 ENCOUNTER — Encounter (HOSPITAL_COMMUNITY): Payer: Self-pay

## 2022-08-19 NOTE — Telephone Encounter (Signed)
We have been unable to reach in reference to labs. Letter sent to patient.

## 2022-08-28 ENCOUNTER — Telehealth (HOSPITAL_COMMUNITY): Payer: Self-pay

## 2022-08-28 ENCOUNTER — Other Ambulatory Visit (HOSPITAL_COMMUNITY): Payer: Self-pay

## 2022-08-28 ENCOUNTER — Telehealth (HOSPITAL_COMMUNITY): Payer: Self-pay | Admitting: Emergency Medicine

## 2022-08-28 NOTE — Telephone Encounter (Signed)
Advanced Heart Failure Patient Advocate Encounter  As of 08/28/2022, this patient has active Medicaid coverage and this will effect the patients needs for medication assistance.  Coverage information has been added to [place], CPhT Rx Patient Advocate Phone: [phone]

## 2022-08-31 ENCOUNTER — Other Ambulatory Visit (HOSPITAL_COMMUNITY): Payer: Self-pay | Admitting: Cardiology

## 2022-08-31 ENCOUNTER — Telehealth (HOSPITAL_COMMUNITY): Payer: Self-pay | Admitting: Licensed Clinical Social Worker

## 2022-08-31 ENCOUNTER — Telehealth (HOSPITAL_COMMUNITY): Payer: Self-pay | Admitting: Emergency Medicine

## 2022-08-31 ENCOUNTER — Ambulatory Visit (HOSPITAL_COMMUNITY)
Admission: RE | Admit: 2022-08-31 | Discharge: 2022-08-31 | Disposition: A | Discharge status: Home / Self Care | Payer: Medicaid Other | Source: Ambulatory Visit | Attending: Cardiology | Admitting: Cardiology

## 2022-08-31 DIAGNOSIS — I517 Cardiomegaly: Secondary | ICD-10-CM | POA: Diagnosis present

## 2022-08-31 DIAGNOSIS — I5032 Chronic diastolic (congestive) heart failure: Secondary | ICD-10-CM | POA: Diagnosis present

## 2022-08-31 MED ORDER — GADOBUTROL 1 MMOL/ML IV SOLN
10.0000 mL | Freq: Once | INTRAVENOUS | Status: AC | PRN
  Administered 2022-08-31: 10 mL via INTRAVENOUS

## 2022-08-31 NOTE — Telephone Encounter (Signed)
Attempted to call patient regarding upcoming cardiac CT appointment. °Left message on voicemail with name and callback number °Presleigh Feldstein RN Navigator Cardiac Imaging °[facility] °[phone] Office °[phone] Cell ° °

## 2022-08-31 NOTE — Telephone Encounter (Signed)
Attempted to call patient regarding upcoming cardiac CT appointment. °Left message on voicemail with name and callback number °Agatha Duplechain RN Navigator Cardiac Imaging °[facility] °[phone] Office °[phone] Cell ° °

## 2022-08-31 NOTE — Telephone Encounter (Signed)
CSW reviewed pt who has been utilizing HF fund to see if they now had Medicaid following Medicaid expansion taking affect on 12/1.  Confirmed pt now has full Medicaid.    CSW mailed out flyer to pt on how to transition their care now that they have insurance.  Will continue to follow and assist as needed  Jorge Ny, [place] [facility] Desk#: [phone] Cell#: [phone]

## 2022-09-03 ENCOUNTER — Ambulatory Visit (HOSPITAL_COMMUNITY)
Admission: RE | Admit: 2022-09-03 | Discharge: 2022-09-03 | Disposition: A | Discharge status: Home / Self Care | Payer: Medicaid Other | Source: Ambulatory Visit | Attending: Family Medicine | Admitting: Family Medicine

## 2022-09-03 ENCOUNTER — Encounter (HOSPITAL_COMMUNITY): Payer: Self-pay

## 2022-09-03 ENCOUNTER — Other Ambulatory Visit: Payer: Self-pay

## 2022-09-03 VITALS — BP 148/90 | HR 94 | Wt 312.0 lb

## 2022-09-03 DIAGNOSIS — Z79899 Other long term (current) drug therapy: Secondary | ICD-10-CM | POA: Insufficient documentation

## 2022-09-03 DIAGNOSIS — Z6841 Body Mass Index (BMI) 40.0 and over, adult: Secondary | ICD-10-CM | POA: Diagnosis not present

## 2022-09-03 DIAGNOSIS — G4733 Obstructive sleep apnea (adult) (pediatric): Secondary | ICD-10-CM | POA: Diagnosis not present

## 2022-09-03 DIAGNOSIS — I5032 Chronic diastolic (congestive) heart failure: Secondary | ICD-10-CM | POA: Diagnosis present

## 2022-09-03 DIAGNOSIS — F32A Depression, unspecified: Secondary | ICD-10-CM | POA: Diagnosis not present

## 2022-09-03 DIAGNOSIS — E1165 Type 2 diabetes mellitus with hyperglycemia: Secondary | ICD-10-CM | POA: Diagnosis not present

## 2022-09-03 DIAGNOSIS — I110 Hypertensive heart disease with heart failure: Secondary | ICD-10-CM | POA: Diagnosis not present

## 2022-09-03 DIAGNOSIS — F4310 Post-traumatic stress disorder, unspecified: Secondary | ICD-10-CM | POA: Insufficient documentation

## 2022-09-03 DIAGNOSIS — I482 Chronic atrial fibrillation, unspecified: Secondary | ICD-10-CM | POA: Insufficient documentation

## 2022-09-03 DIAGNOSIS — Q245 Malformation of coronary vessels: Secondary | ICD-10-CM | POA: Insufficient documentation

## 2022-09-03 DIAGNOSIS — Z7901 Long term (current) use of anticoagulants: Secondary | ICD-10-CM | POA: Insufficient documentation

## 2022-09-03 DIAGNOSIS — Z7984 Long term (current) use of oral hypoglycemic drugs: Secondary | ICD-10-CM | POA: Insufficient documentation

## 2022-09-03 DIAGNOSIS — Z91199 Patient's noncompliance with other medical treatment and regimen due to unspecified reason: Secondary | ICD-10-CM | POA: Diagnosis not present

## 2022-09-03 DIAGNOSIS — F419 Anxiety disorder, unspecified: Secondary | ICD-10-CM | POA: Insufficient documentation

## 2022-09-03 LAB — BASIC METABOLIC PANEL
Anion gap: 9 (ref 5–15)
BUN: 25 mg/dL — ABNORMAL HIGH (ref 6–20)
CO2: 25 mmol/L (ref 22–32)
Calcium: 9.3 mg/dL (ref 8.9–10.3)
Chloride: 103 mmol/L (ref 98–111)
Creatinine, Ser: 1.2 mg/dL — ABNORMAL HIGH (ref 0.44–1.00)
GFR, Estimated: 54 mL/min — ABNORMAL LOW (ref >60)
Glucose, Bld: 211 mg/dL — ABNORMAL HIGH (ref 70–99)
Potassium: 4 mmol/L (ref 3.5–5.1)
Sodium: 137 mmol/L (ref 135–145)

## 2022-09-03 LAB — BRAIN NATRIURETIC PEPTIDE: B Natriuretic Peptide: 843.8 pg/mL — ABNORMAL HIGH (ref 0.0–100.0)

## 2022-09-03 MED ORDER — ENTRESTO 97-103 MG PO TABS
1.0000 | ORAL_TABLET | Freq: Two times a day (BID) | ORAL | 10 refills | Status: DC
  Filled 2022-09-03: qty 60, 30d supply, fill #0
  Filled 2022-10-01 – 2022-11-17 (×4): qty 60, 30d supply, fill #1
  Filled 2023-01-11: qty 60, 30d supply, fill #2
  Filled 2023-03-30: qty 60, 30d supply, fill #3
  Filled 2023-06-14: qty 60, 30d supply, fill #4
  Filled 2023-07-25: qty 60, 30d supply, fill #5

## 2022-09-03 MED ORDER — HYDRALAZINE HCL 25 MG PO TABS
25.0000 mg | ORAL_TABLET | Freq: Three times a day (TID) | ORAL | 3 refills | Status: DC
  Filled 2022-09-03: qty 90, 30d supply, fill #0
  Filled 2022-10-09: qty 90, 30d supply, fill #1
  Filled 2022-11-18: qty 90, 30d supply, fill #2
  Filled 2022-12-15: qty 90, 30d supply, fill #3

## 2022-09-03 NOTE — Patient Instructions (Addendum)
Thank you for coming in today  Labs were done today, if any labs are abnormal the clinic will call you No news is good news  INCREASE Entresto 97/103 mg 1 tablet 2 times a day  You have been referred for genetic testing and they will call you to make a appointment   You have been referred for [facility] and will be contacted by Pharmacy to discuss further.  Your physician recommends that you schedule a follow-up appointment in: 4-6 weeks with Dr. Aundra Dubin    Do the following things EVERYDAY: Weigh yourself in the morning before breakfast. Write it down and keep it in a log. Take your medicines as prescribed Eat low salt foods--Limit salt (sodium) to 2000 mg per day.  Stay as active as you can everyday Limit all fluids for the day to less than 2 liters  At the [facility], you and your health needs are our priority. As part of our continuing mission to provide you with exceptional heart care, we have created designated Provider Care Teams. These Care Teams include your primary Cardiologist (physician) and Advanced Practice Providers (APPs- Physician Assistants and Nurse Practitioners) who all work together to provide you with the care you need, when you need it.   You may see any of the following providers on your designated Care Team at your next follow up: Dr Glori Bickers Dr Loralie Champagne Dr. Roxana Hires, NP Lyda Jester, [place] [facility] [place], [place] Forestine Na, NP Audry Riles, PharmD   Please be sure to bring in all your medications bottles to every appointment.   If you have any questions or concerns before your next appointment please send [place] a message through Icard or call our office at [phone].    TO LEAVE A MESSAGE FOR THE NURSE SELECT OPTION 2, PLEASE LEAVE A MESSAGE INCLUDING: YOUR NAME DATE OF BIRTH CALL BACK NUMBER REASON FOR CALL**this is important as we prioritize the call backs  YOU WILL RECEIVE A  CALL BACK THE SAME DAY AS LONG AS YOU CALL BEFORE 4:00 PM

## 2022-09-03 NOTE — Progress Notes (Signed)
Advanced [facility] Failure Clinic  Note  PCP: [name], NP HF Cardiologist: Dr. Aundra Dubin  HPI: [name] is a 56 y.o.female with a history of chronic diastolic heart failure, persistent atrial fibrillation on Xarelto, hypertension, obesity and OSA, not compliant w/ CPAP.  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  She had VFib with heart cath, treated w/ defibrillation to NSR. She also developed AKI after cath.  Echo 04/18/21 with EF 60-65%, no RWMA, severe LVH, RV normal with moderately elevated PA systolic pressure, Lt atrial size severely dilated and RA mildly dilated.  Mild MR, mod to severe TR.    Admitted 0/09/38 for a/c diastolic CHF w/ fluid overload. Afib well rate controlled. Diuresed w/ IV Lasix, then transitioned to GDMT. Discharge home, weight 328 lbs.  Admitted 05/25/22 with acute on chronic HFpEF. Ran out her medications while traveling out of the country. Diuresed with IV lasix and transitioned to torsemide 40 mg bid. Echo showed LVEF 60-65%, severe LVH, septal thickenness over 20 mm but difficult to measure, RV normal.  Discharge weight 308 pounds.   Seen in TOC 9/26/3, volume up and only taking torsemide once a day and off SGLT2i.  Torsemide increased to 40 mg bid. Jardiance restarted, hydralazine restarted (was not taking at home). Follow up 06/10/22, she remained volume overloaded. She had stopped Jardiance, so this was restarted. Losartan stopped and Entresto 24/26 started.  Follow up 06/29/22, NYHA IIIb symptoms and appeared volume overloaded. Instructed she take metolazone 2.5 mg and continue torsemide 40 bid.   cMRI 12/23>> severe LAE with moderate RAE, severe basal septal hypertrophy 17 mm LVEF normal 64% no RWMAs, normal RV size and function RVEF 51%, abnormal delayed gadolinium images with mottled uptake in apex and septum and more defined area of  moderate uptake in mid inferior wall, normal parametric measures consider further w/u sarcoid or hypertrophic cardiomyopathy with PET/CT Less likely amyloid given normal ECV and T1.  Gene testing +. 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   She returns for HF followup today. Reports chronic NYHA Class II-early III symptoms. No LEE, orthopnea/PND. Reports full med compliance. Has been followed by paramedicine. Denies palpitations. No syncope, near syncope. No CP. BP 148/90. Wt up 8 lb since October.   Her main complaint is insomnia, anxiety, depression and PTSD. She is asking for referral to behavorial health.    Labs (7/23): Myeloma panel negative Labs (9/23): K 4.2, creatinine 1.36 Labs (10/23): K 4.2, creatinine 1.34, BNP 455   PMH: 1. Atrial fibrillation: Chronic since 2019.  2. HTN 3. OSA: Unable to tolerate CPAP.  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   - Cardiolite (8/17): No ischemia 5. Chronic diastolic CHF: Echo (1/82): EF 60-65%, severe LVH, Septal thickenness over 45m but difficult to measure, RV normal.   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 EF >65%.  - Chest CT 2019 Prominent mediastinal lymph nodes, likely reactive. Lungs/Pleura: +Calcified granulomas. 6. Bipolar disorder 7. 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 But ran out of money and is no longer attending school. (Note patient has bipolar disorder).      Born in [place] but lived in [place] before coming back to [place].       Primary language is Arabic. Lives with mother and brother.   Social Determinants of Health   Financial Resource Strain: Low Risk  (05/26/2022)   Overall Financial Resource Strain (CARDIA)    Difficulty of Paying Living Expenses: Not very hard  Food Insecurity: No Food Insecurity (05/26/2022)   Hunger Vital Sign    Worried About Running Out of Food in the Last Year: Never true    Ran Out of Food in the Last Year: Never true  Transportation Needs: No Transportation Needs (05/26/2022)   PRAPARE - Hydrologist (Medical): No    Lack of Transportation (Non-Medical): No  Physical Activity: Not on file  Stress: No Stress Concern Present (07/15/2018)   [place]    Feeling of Stress : Only a little  Social Connections: Not on file  Intimate Partner Violence: Not on file   FH:  Brother with sudden cardiac death, father with CVA  Current Outpatient Medications  Medication Sig Dispense Refill   amLODipine (NORVASC) 10 MG tablet Take 10 mg by mouth daily.     carvedilol (COREG) 25 MG tablet Take 1 tablet (25 mg total) by mouth 2 (two) times daily with a meal. 60 tablet 3   empagliflozin (JARDIANCE) 10 MG TABS tablet Take 1 tablet (10 mg total) by mouth daily before breakfast. 30 tablet 6   glipiZIDE (GLUCOTROL) 10 MG tablet Take 10 mg by mouth daily before breakfast.     hydrALAZINE (APRESOLINE) 25 MG tablet Take 1 tablet (25 mg total) by mouth 3 (three) times daily. 90 tablet 3   hydrOXYzine (VISTARIL) 25 MG capsule Take 1 capsule (25 mg total) by mouth every 8 (eight) hours as needed. 60  capsule 1   Incontinence Supply Disposable (INCONTINENCE BRIEF LARGE) MISC Please provide patient with insurance approved incontinence supplies/briefs 18 each 6   metFORMIN (GLUCOPHAGE) 1000 MG tablet Take 1 tablet (1,000 mg total) by mouth 2 (two) times daily with a meal. 60 tablet 0   risperiDONE (RISPERDAL) 2 MG tablet Take 1 tablet (2 mg total) by mouth at bedtime. 30 tablet 0   rivaroxaban (XARELTO) 20 MG TABS tablet Take 1 tablet (20 mg total) by mouth daily with supper. 30 tablet 3   sacubitril-valsartan (ENTRESTO) 49-51 MG Take 1 tablet by mouth 2 (two) times daily. 60 tablet 5   spironolactone (ALDACTONE) 25 MG tablet Take 1 tablet (25 mg total) by mouth daily. 30 tablet 3   torsemide (DEMADEX) 20 MG tablet Take 3 tablets daily alternating with 2 tablets daily 360 tablet 0   Accu-Chek Softclix Lancets lancets Use up to 4 times daily (Patient not taking: Reported on 08/13/2022)  100 each 0   Blood Glucose Monitoring Suppl (ACCU-CHEK GUIDE) w/Device KIT Use to check blood sugar three times daily. (Patient not taking: Reported on 08/13/2022) 1 kit 0   Blood Pressure Monitor DEVI Please provide patient with insurance approved blood pressure monitor I10.0 (Patient not taking: Reported on 08/06/2022) 1 each 0   glucose blood (ACCU-CHEK GUIDE) test strip Use as directed up to 4 times daily (Patient not taking: Reported on 08/13/2022) 100 each 0   Naphazoline-Pheniramine (EYE ALLERGY RELIEF OP) Place 1 drop into both eyes daily as needed (allergy). (Patient not taking: Reported on 08/06/2022)     No current facility-administered medications for this encounter.   BP (!) 148/90   Pulse 94   Wt (!) 141.5 kg (312 lb)   SpO2 97%   BMI 48.87 kg/m   Wt Readings from Last 3 Encounters:  09/03/22 (!) 141.5 kg (312 lb)  08/06/22 (!) 142.4 kg (314 lb)  07/23/22 (!) 138.2 kg (304 lb 9.6 oz)   PHYSICAL EXAM: General:  Well appearing, obese. No respiratory difficulty HEENT: normal Neck: supple. JVD 8  cm. Carotids 2+ bilat; no bruits. No lymphadenopathy or thyromegaly appreciated. Cor: PMI nondisplaced. Regular rate & rhythm. No rubs, gallops or murmurs. Lungs: clear Abdomen: obese, soft, nontender, nondistended. No hepatosplenomegaly. No bruits or masses. Good bowel sounds. Extremities: no cyanosis, clubbing, rash, edema Neuro: alert & oriented x 3, cranial nerves grossly intact. moves all 4 extremities w/o difficulty. Affect pleasant.    ASSESSMENT & PLAN: 1. Chronic Diastolic CHF:  Echo (1/61) with EF 60-65%, severe LVH, Septal thickenness over 32m but difficult to measure, RV normal.   Severe LVH may be secondary to HTN, but also concerned for possible hypertrophic cardiomyopathy (less likely cardiac amyloidosis).  Father and brother both with sudden death is concerning for familial HCM.  Gene testing +. 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 cMRI w/ normal sized RV and function, RVEF 51%, Severe basal septal hypertrophy 17 mm LVEF normal 64% no RWMAs, + abnormal delayed gadolinium images with mottled uptake in apex and septum and more defined area of moderate uptake in mid inferior wall. PET scan recommended but pt has [employer](not covered at [employer]. NYHA Class II-early III, confounded by obesity/deconditioning/sedentary lifestyle. Appears mildly fluid overloaded on exam. Wt up. BP mildly elevated.  - Increase Entresto to 97-103 mg bid  - Continue torsemide 40 mg bid. Check BMP and BNP today. May need dose titration if BNP elevated.  - Continue Jardiance 10 mg                                                                                                                                                                    -  Continue spironolactone 25 mg daily  - Continue hydralazine 25 mg tid  - Continue carvedilol 25 mg bid. - Refer to Dr. Broadus John for abnormal gene testing  - She has had multiple  hospitalizations for a/c CHF in the last year. I recommended Cardiomems placement. She continues to refuse this.  2. Hypertension:  - mildly elevated, GDMT per above, increase Entresto  - BMP today  3. Atrial fibrillation: Chronic since at least 2019.  Unlikely to cardiovert and stay in NSR at this point given chronicity and noncompliance with CPAP.  Rate controlled.  - Continue Coreg 25 mg bid  - Continue Xarelto.  Denies abnormal bleeding   4. OSA: Not compliant w/ CPAP.  - encouraged her to improve compliance   5. Morbid Obesity: Body mass index is 48.87 kg/m. - referral for semaglutide. Pt agrees to this  6. DM II : Poor control.  - continue Jardiance and metformin - refer for semaglutide 7. Anomalous RCA: Off the left cusp, appears to try interarterially between aorta and PA.  Cardiolite in 2017 showed no ischemia.  With an anomalous right, no attributable symptoms (no chest pain), and normal Cardiolite in past, would medically manage this.   F/u in 4 wks   Lyda Jester, PA-C   09/03/2022

## 2022-09-04 ENCOUNTER — Other Ambulatory Visit: Payer: Self-pay

## 2022-09-04 ENCOUNTER — Other Ambulatory Visit: Payer: Self-pay | Admitting: Nurse Practitioner

## 2022-09-04 ENCOUNTER — Telehealth: Payer: Self-pay | Admitting: Pharmacist

## 2022-09-04 DIAGNOSIS — F419 Anxiety disorder, unspecified: Secondary | ICD-10-CM

## 2022-09-04 DIAGNOSIS — E1169 Type 2 diabetes mellitus with other specified complication: Secondary | ICD-10-CM

## 2022-09-04 MED ORDER — METFORMIN HCL 1000 MG PO TABS
1000.0000 mg | ORAL_TABLET | Freq: Two times a day (BID) | ORAL | 0 refills | Status: DC
  Filled 2022-09-04: qty 60, 30d supply, fill #0

## 2022-09-04 MED ORDER — RISPERIDONE 2 MG PO TABS
2.0000 mg | ORAL_TABLET | Freq: Every day | ORAL | 0 refills | Status: DC
  Filled 2022-09-04: qty 30, 30d supply, fill #0

## 2022-09-04 NOTE — Telephone Encounter (Signed)
-----   Message from Payton Mccallum, RN sent at 09/03/2022  2:25 PM EST ----- Regarding: semuglatide Greetings,  Lyda Jester PA wants pt to be started on Semuglatide any questions or concerns please reach out I am only in the office 2-3 days a week.  Thank you   [name]

## 2022-09-04 NOTE — Telephone Encounter (Signed)
Pt has uncontrolled DM with most recent A1c 10.5% on 05/25/22. Previously on Trulicity, stopped due to pruritus per PCP note, had also been using new soap and detergent which may have contributed. Currently taking metformin, glipizide, and Jardiance. She has traditional Medicaid which prefers Ozempic, Trulicity, Bydureon or Victoza for [employer].  Called pt to see if she prefers to resume Trulicity or try Ozempic (injections a little more involved). Home # busy, the mobile # listed is her mother's.

## 2022-09-07 ENCOUNTER — Other Ambulatory Visit: Payer: Self-pay

## 2022-09-07 NOTE — Telephone Encounter (Signed)
Called pt, no answer, unable to leave message (asked me to enter remote access code, then call ended).

## 2022-09-09 ENCOUNTER — Encounter: Payer: Self-pay | Admitting: Pharmacist

## 2022-09-09 NOTE — Telephone Encounter (Signed)
3rd call made to pt, no answer and unable to leave voicemail. Will send her MyChart message asking her to call the office if she's interested in trying [place].

## 2022-09-10 ENCOUNTER — Other Ambulatory Visit (HOSPITAL_COMMUNITY): Payer: Self-pay | Admitting: Emergency Medicine

## 2022-09-10 ENCOUNTER — Other Ambulatory Visit: Payer: Self-pay

## 2022-09-10 NOTE — Progress Notes (Signed)
Paramedicine Encounter    Patient ID: [name], female    DOB: 11/17/1965, 56 y.o.   MRN: [id number]   BP (!) 150/90 (BP Location: Right Arm, Patient Position: Sitting, Cuff Size: Normal)   Pulse 90   Resp 18   Wt (!) 307 lb 12.8 oz (139.6 kg)   SpO2 97%   BMI 48.21 kg/m  Weight yesterday-not taken Last visit weight-312lb. [id number]  ATF Ms. Riederer A&O x 4, skin W&D w/ good color.  Pt. Reports to be feeling well.  She seems much more "perky" than in past visits and was smiling a lot.  I delivered to her refills on her Risperdal and Metformin that I picked up earlier from [employer].  I reconciled  med box x 1 week.  Refills needed for Glipizide and Hydroxyzine and I will call them in for her.  She denies chest pain or SOB.  No edema noted and she is down 5lbs from her clinic visit on 09/03/22.   I saw where pharmacist Fuller [place] had been attempting unsuccessfully to reach pt regarding a new medication which I believe is Ozempic.  I wrote down Megan's name and number and instructed pt to reach out to [facility] tomorrow and she advised she would do same.  I also sent message to Ms. Supple advising that Ms. Perdue would be reaching out to her tomorrow. Pt has a glucometer and lancets to monitor her blood sugar with but she says she doesn't like to check her sugar because "it hurts to stick my finger."  I discussed that she should start attempting to check her sugar at least once daily and keep a diary of readings. Pt. Has been approved for Medicaid.  She states she hasn't received her card yet and I advised her it should be coming in the mail soon.  She has an appointment coming up with her PCP 12/28 and I advised her that if she didn't have her Medicaid card by the appointment that they should be able to look it up for her. Next home visit scheduled for 12/21 @ 3:30.   Visit complete.    Renee Ramus, [place] 09/10/2022     Patient Care Team: [name],  NP as PCP - General (Nurse Practitioner) Lelon Perla, MD as PCP - Cardiology (Cardiology) [name] Denice Bors, MD (Cardiology) Pccm, Md, MD as Consulting Physician (Pulmonary Disease) Deberah Pelton, NP as Nurse Practitioner (Cardiology)  Patient Active Problem List   Diagnosis Date Noted   Essential hypertension 05/26/2022   Hypokalemia 05/26/2022   CHF exacerbation ([place]) 19/37/9024   Acute diastolic CHF (congestive heart failure) ([place]) 11/10/2021   Acute on chronic congestive heart failure ([place]) 04/18/2021   Acute on chronic heart failure ([place]) 04/17/2021   Elevated troponin    Obesity hypoventilation syndrome ([place]) 12/15/2019   Depression 12/15/2019   ILD (interstitial lung disease) ([place]) 05/28/2019   Exertional angina 05/28/2019   AKI (acute kidney injury) ([place])    Hematochezia 07/14/2018   Acute posthemorrhagic anemia 07/14/2018   GIB (gastrointestinal bleeding) 07/14/2018   Persistent atrial fibrillation ([place])    Uncontrolled type 2 diabetes mellitus with hyperglycemia ([place])    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   Pyrexia    Dyspnea 11/13/2015   Mediastinal adenopathy 11/13/2015   Abnormal CT scan, chest 11/11/2015   Chest pain 11/11/2015   Depression (emotion) 03/07/2015   Noninfectious gastroenteritis and colitis 01/02/2015   Sinusitis, chronic 01/02/2015   Midline low back pain  without sciatica 09/10/2014   Bipolar 1 disorder, mixed, moderate ([place]) 07/02/2014   Stress incontinence 07/02/2014   Mania ([place]) 12/10/2013   Speech abnormality 12/08/2013   SVT (supraventricular tachycardia) 12/06/2013   H/O non-insulin dependent diabetes mellitus 11/28/2013   Pulmonary HTN, moderate to severe 11/03/2013   Acute on chronic diastolic congestive heart failure ([place]) 11/02/2013   Hypertensive heart disease 10/18/2013   Non-insulin dependent type 2 diabetes mellitus ([place]) 10/18/2013   Chronic diastolic heart failure ([place]) 07/23/2011   Class 3 severe obesity  due to excess calories with serious comorbidity and body mass index (BMI) of 60.0 to 69.9 in adult [facility]) 02/19/2011   Non-ST elevation (NSTEMI) myocardial infarction ([place]) 04/29/2009   Bipolar disorder     Current Outpatient Medications:    amLODipine (NORVASC) 10 MG tablet, Take 10 mg by mouth daily., Disp: , Rfl:    carvedilol (COREG) 25 MG tablet, Take 1 tablet (25 mg total) by mouth 2 (two) times daily with a meal., Disp: 60 tablet, Rfl: 3   empagliflozin (JARDIANCE) 10 MG TABS tablet, Take 1 tablet (10 mg total) by mouth daily before breakfast., Disp: 30 tablet, Rfl: 6   glipiZIDE (GLUCOTROL) 10 MG tablet, Take 10 mg by mouth daily before breakfast., Disp: , Rfl:    hydrALAZINE (APRESOLINE) 25 MG tablet, Take 1 tablet (25 mg total) by mouth 3 (three) times daily., Disp: 90 tablet, Rfl: 3   hydrOXYzine (VISTARIL) 25 MG capsule, Take 1 capsule (25 mg total) by mouth every 8 (eight) hours as needed., Disp: 60 capsule, Rfl: 1   Incontinence Supply Disposable (INCONTINENCE BRIEF LARGE) MISC, Please provide patient with insurance approved incontinence supplies/briefs, Disp: 18 each, Rfl: 6   metFORMIN (GLUCOPHAGE) 1000 MG tablet, Take 1 tablet (1,000 mg total) by mouth 2 (two) times daily with a meal., Disp: 60 tablet, Rfl: 0   risperiDONE (RISPERDAL) 2 MG tablet, Take 1 tablet (2 mg total) by mouth at bedtime., Disp: 30 tablet, Rfl: 0   rivaroxaban (XARELTO) 20 MG TABS tablet, Take 1 tablet (20 mg total) by mouth daily with supper., Disp: 30 tablet, Rfl: 3   sacubitril-valsartan (ENTRESTO) 97-103 MG, Take 1 tablet by mouth 2 (two) times daily., Disp: 60 tablet, Rfl: 10   spironolactone (ALDACTONE) 25 MG tablet, Take 1 tablet (25 mg total) by mouth daily., Disp: 30 tablet, Rfl: 3   torsemide (DEMADEX) 20 MG tablet, Take 3 tablets daily alternating with 2 tablets daily, Disp: 360 tablet, Rfl: 0   Accu-Chek Softclix Lancets lancets, Use up to 4 times daily (Patient not taking: Reported on  08/13/2022), Disp: 100 each, Rfl: 0   Blood Glucose Monitoring Suppl (ACCU-CHEK GUIDE) w/Device KIT, Use to check blood sugar three times daily. (Patient not taking: Reported on 08/13/2022), Disp: 1 kit, Rfl: 0   Blood Pressure Monitor DEVI, Please provide patient with insurance approved blood pressure monitor I10.0 (Patient not taking: Reported on 08/06/2022), Disp: 1 each, Rfl: 0   glucose blood (ACCU-CHEK GUIDE) test strip, Use as directed up to 4 times daily (Patient not taking: Reported on 08/13/2022), Disp: 100 each, Rfl: 0   Naphazoline-Pheniramine (EYE ALLERGY RELIEF OP), Place 1 drop into both eyes daily as needed (allergy). (Patient not taking: Reported on 08/06/2022), Disp: , Rfl:  Allergies  Allergen Reactions   Acetaminophen Other (See Comments)    Seizure-like "fits" as a child   Caffeine     Tense, anxiety, increased urination   [place] [Dapagliflozin] Other (See Comments)    Hallucinations,  drop in blood sugar   Lisinopril Rash    Rash with lisinopril; but fosinopril is ok per patient      Social History   Socioeconomic History   Marital status: Single    Spouse name: Not on file   Number of children: 0   Years of education: 18   Highest education level: Not on file  Occupational History   Occupation: unemployed  Tobacco Use   Smoking status: Never   Smokeless tobacco: Never  Vaping Use   Vaping Use: Never used  Substance and Sexual Activity   Alcohol use: No   Drug use: No   Sexual activity: Not Currently    Birth control/protection: None  Other Topics Concern   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 But ran out of money and is no longer attending school. (Note patient has bipolar disorder).      Born in [place] but lived in [place] before coming back to [place].       Primary language is Arabic. Lives with mother and brother.   Social Determinants  of Health   Financial Resource Strain: Low Risk  (05/26/2022)   Overall Financial Resource Strain (CARDIA)    Difficulty of Paying Living Expenses: Not very hard  Food Insecurity: No Food Insecurity (05/26/2022)   Hunger Vital Sign    Worried About Running Out of Food in the Last Year: Never true    Ran Out of Food in the Last Year: Never true  Transportation Needs: No Transportation Needs (05/26/2022)   PRAPARE - Hydrologist (Medical): No    Lack of Transportation (Non-Medical): No  Physical Activity: Not on file  Stress: No Stress Concern Present (07/15/2018)   [place]    Feeling of Stress : Only a little  Social Connections: Not on file  Intimate Partner Violence: Not on file    Physical Exam      Future Appointments  Date Time Provider [place]  09/24/2022  1:50 PM Argentina Donovan, [place] [facility] None  10/15/2022  2:20 PM Larey Dresser, MD [facility] None  12/23/2022  8:20 AM [name], Denice Bors, MD [facility] None       Renee Ramus, EMT-P-Paramedic [place] Paramedic  09/10/22

## 2022-09-14 ENCOUNTER — Ambulatory Visit: Payer: Medicaid Other | Admitting: Internal Medicine

## 2022-09-14 ENCOUNTER — Ambulatory Visit: Payer: Medicaid Other | Admitting: Nurse Practitioner

## 2022-09-16 ENCOUNTER — Telehealth (HOSPITAL_COMMUNITY): Payer: Self-pay | Admitting: Licensed Clinical Social Worker

## 2022-09-16 NOTE — Telephone Encounter (Signed)
HF Paramedicine Team Based Care Meeting  HF MD- NA  HF NP - [employer] NP-C   [facility] admit within the last 30 days for heart failure? no  Medications concerns? Not motivated to get her own meds a lot of time. Not consistently compliant with taking meds  Education needs? Understands her diagnoses.  Eligible for discharge? Might need to discuss DC as she is not consistent with appts and noncompliance seems to be motivated based- paramedic thinks making some influence but don't think that continued education will provide further compliance.  Jorge Ny, LCSW Clinical Social Worker [facility] Desk#: [phone] Cell#: [phone]

## 2022-09-17 ENCOUNTER — Other Ambulatory Visit (HOSPITAL_COMMUNITY): Payer: Self-pay | Admitting: Emergency Medicine

## 2022-09-17 ENCOUNTER — Telehealth (HOSPITAL_COMMUNITY): Payer: Self-pay | Admitting: Cardiology

## 2022-09-17 NOTE — Progress Notes (Signed)
Paramedicine Encounter    Patient ID: [name], female    DOB: 1965-11-20, 56 y.o.   MRN: [id number]   BP (!) 158/100 (BP Location: Left Arm, Patient Position: Sitting, Cuff Size: Normal)   Pulse 90   Resp (!) 22   SpO2 94%  Weight yesterday-not taken Last visit weight-307 lb   ATF Ms. Koloski A&O x 4, skin W&D w/ good color.  Pt has not been compliant with her medications.   Med box reconciled x 1 week.  Pt denies chest pain or SOB.  Lung sounds are clear and equal bilat.  She has some edema to her lower extremities.  Her BP remains elevated as it was last visit.  Reached out to HF Triage to see if any changes might be desired.     Patient Care Team: [name], NP as PCP - General (Nurse Practitioner) Lelon Perla, MD as PCP - Cardiology (Cardiology) Lelon Perla, MD (Cardiology) Pccm, Md, MD as Consulting Physician (Pulmonary Disease) Deberah Pelton, NP as Nurse Practitioner (Cardiology)  Patient Active Problem List   Diagnosis Date Noted   Essential hypertension 05/26/2022   Hypokalemia 05/26/2022   CHF exacerbation ([place]) 02/72/5366   Acute diastolic CHF (congestive heart failure) ([place]) 11/10/2021   Acute on chronic congestive heart failure ([place]) 04/18/2021   Acute on chronic heart failure ([place]) 04/17/2021   Elevated troponin    Obesity hypoventilation syndrome ([place]) 12/15/2019   Depression 12/15/2019   ILD (interstitial lung disease) ([place]) 05/28/2019   Exertional angina 05/28/2019   AKI (acute kidney injury) ([place])    Hematochezia 07/14/2018   Acute posthemorrhagic anemia 07/14/2018   GIB (gastrointestinal bleeding) 07/14/2018   Persistent atrial fibrillation ([place])    Uncontrolled type 2 diabetes mellitus with hyperglycemia ([place])    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/2015   Pyrexia    Dyspnea 11/13/2015   Mediastinal adenopathy 11/13/2015   Abnormal CT scan, chest 11/11/2015   Chest pain 11/11/2015   Depression (emotion) 03/07/2015    Noninfectious gastroenteritis and colitis 01/02/2015   Sinusitis, chronic 01/02/2015   Midline low back pain without sciatica 09/10/2014   Bipolar 1 disorder, mixed, moderate ([place]) 07/02/2014   Stress incontinence 07/02/2014   Mania ([place]) 12/10/2013   Speech abnormality 12/08/2013   SVT (supraventricular tachycardia) 12/06/2013   H/O non-insulin dependent diabetes mellitus 11/28/2013   Pulmonary HTN, moderate to severe 11/03/2013   Acute on chronic diastolic congestive heart failure ([place]) 11/02/2013   Hypertensive heart disease 10/18/2013   Non-insulin dependent type 2 diabetes mellitus ([place]) 10/18/2013   Chronic diastolic heart failure ([place]) 07/23/2011   Class 3 severe obesity due to excess calories with serious comorbidity and body mass index (BMI) of 60.0 to 69.9 in adult [facility]) 02/19/2011   Non-ST elevation (NSTEMI) myocardial infarction ([place]) 04/29/2009   Bipolar disorder     Current Outpatient Medications:    Accu-Chek Softclix Lancets lancets, Use up to 4 times daily (Patient not taking: Reported on 08/13/2022), Disp: 100 each, Rfl: 0   amLODipine (NORVASC) 10 MG tablet, Take 10 mg by mouth daily., Disp: , Rfl:    Blood Glucose Monitoring Suppl (ACCU-CHEK GUIDE) w/Device KIT, Use to check blood sugar three times daily. (Patient not taking: Reported on 08/13/2022), Disp: 1 kit, Rfl: 0   Blood Pressure Monitor DEVI, Please provide patient with insurance approved blood pressure monitor I10.0 (Patient not taking: Reported on 08/06/2022), Disp: 1 each, Rfl: 0   carvedilol (COREG) 25 MG tablet, Take 1 tablet (  25 mg total) by mouth 2 (two) times daily with a meal., Disp: 60 tablet, Rfl: 3   empagliflozin (JARDIANCE) 10 MG TABS tablet, Take 1 tablet (10 mg total) by mouth daily before breakfast., Disp: 30 tablet, Rfl: 6   glipiZIDE (GLUCOTROL) 10 MG tablet, Take 10 mg by mouth daily before breakfast., Disp: , Rfl:    glucose blood (ACCU-CHEK GUIDE) test strip, Use as directed up to 4 times  daily (Patient not taking: Reported on 08/13/2022), Disp: 100 each, Rfl: 0   hydrALAZINE (APRESOLINE) 25 MG tablet, Take 1 tablet (25 mg total) by mouth 3 (three) times daily., Disp: 90 tablet, Rfl: 3   hydrOXYzine (VISTARIL) 25 MG capsule, Take 1 capsule (25 mg total) by mouth every 8 (eight) hours as needed., Disp: 60 capsule, Rfl: 1   Incontinence Supply Disposable (INCONTINENCE BRIEF LARGE) MISC, Please provide patient with insurance approved incontinence supplies/briefs, Disp: 18 each, Rfl: 6   metFORMIN (GLUCOPHAGE) 1000 MG tablet, Take 1 tablet (1,000 mg total) by mouth 2 (two) times daily with a meal., Disp: 60 tablet, Rfl: 0   Naphazoline-Pheniramine (EYE ALLERGY RELIEF OP), Place 1 drop into both eyes daily as needed (allergy). (Patient not taking: Reported on 08/06/2022), Disp: , Rfl:    risperiDONE (RISPERDAL) 2 MG tablet, Take 1 tablet (2 mg total) by mouth at bedtime., Disp: 30 tablet, Rfl: 0   rivaroxaban (XARELTO) 20 MG TABS tablet, Take 1 tablet (20 mg total) by mouth daily with supper., Disp: 30 tablet, Rfl: 3   sacubitril-valsartan (ENTRESTO) 97-103 MG, Take 1 tablet by mouth 2 (two) times daily., Disp: 60 tablet, Rfl: 10   spironolactone (ALDACTONE) 25 MG tablet, Take 1 tablet (25 mg total) by mouth daily., Disp: 30 tablet, Rfl: 3   torsemide (DEMADEX) 20 MG tablet, Take 3 tablets daily alternating with 2 tablets daily, Disp: 360 tablet, Rfl: 0 Allergies  Allergen Reactions   Acetaminophen Other (See Comments)    Seizure-like "fits" as a child   Caffeine     Tense, anxiety, increased urination   [place] [Dapagliflozin] Other (See Comments)    Hallucinations, drop in blood sugar   Lisinopril Rash    Rash with lisinopril; but fosinopril is ok per patient      Social History   Socioeconomic History   Marital status: Single    Spouse name: Not on file   Number of children: 0   Years of education: 18   Highest education level: Not on file  Occupational History    Occupation: unemployed  Tobacco Use   Smoking status: Never   Smokeless tobacco: Never  Vaping Use   Vaping Use: Never used  Substance and Sexual Activity   Alcohol use: No   Drug use: No   Sexual activity: Not Currently    Birth control/protection: None  Other Topics Concern   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 But ran out of money and is no longer attending school. (Note patient has bipolar disorder).      Born in [place] but lived in [place] before coming back to [place].       Primary language is Arabic. Lives with mother and brother.   Social Determinants of Health   Financial Resource Strain: Low Risk  (05/26/2022)   Overall Financial Resource Strain (CARDIA)    Difficulty of Paying Living Expenses: Not  very hard  Food Insecurity: No Food Insecurity (05/26/2022)   Hunger Vital Sign    Worried About Running Out of Food in the Last Year: Never true    Ran Out of Food in the Last Year: Never true  Transportation Needs: No Transportation Needs (05/26/2022)   PRAPARE - Hydrologist (Medical): No    Lack of Transportation (Non-Medical): No  Physical Activity: Not on file  Stress: No Stress Concern Present (07/15/2018)   [place]    Feeling of Stress : Only a little  Social Connections: Not on file  Intimate Partner Violence: Not on file    Physical Exam      Future Appointments  Date Time Provider [place]  09/24/2022  1:50 PM Argentina Donovan, [place] [facility] None  10/15/2022  2:20 PM Larey Dresser, MD [facility] None  12/23/2022  8:20 AM [name], Denice Bors, MD [facility] None       Renee Ramus, EMT-P-Paramedic [place] Paramedic  09/17/22

## 2022-09-17 NOTE — Telephone Encounter (Signed)
[facility] with para medicine called to report increase in weight x 5 lbs since last week home visit B/p 150/90 R 22 Mild edema in BLE Lungs clear and denies chest pains   -pt admits to missing doses of medications this week -pt admits to poor diet and non adherence to fluid restrictions   Please advise

## 2022-09-17 NOTE — Telephone Encounter (Signed)
If she is currently taking torsemide 40 mg bid, increase to 60 mg bid for 4 days then back to 40 bid.  Restrict sodium and fluid and make sure she takes all meds.

## 2022-09-18 NOTE — Telephone Encounter (Signed)
Pt aware via Freada Bergeron with para medicine

## 2022-09-24 ENCOUNTER — Other Ambulatory Visit: Payer: Self-pay

## 2022-09-24 ENCOUNTER — Other Ambulatory Visit (HOSPITAL_COMMUNITY): Payer: Self-pay | Admitting: Emergency Medicine

## 2022-09-24 ENCOUNTER — Ambulatory Visit: Payer: Medicaid Other | Attending: Nurse Practitioner | Admitting: Physician Assistant

## 2022-09-24 ENCOUNTER — Encounter: Payer: Self-pay | Admitting: Physician Assistant

## 2022-09-24 ENCOUNTER — Other Ambulatory Visit: Payer: Self-pay | Admitting: Nurse Practitioner

## 2022-09-24 VITALS — BP 112/71 | HR 91 | Temp 98.1°F | Resp 16 | Wt 312.2 lb

## 2022-09-24 DIAGNOSIS — E1165 Type 2 diabetes mellitus with hyperglycemia: Secondary | ICD-10-CM

## 2022-09-24 DIAGNOSIS — I4819 Other persistent atrial fibrillation: Secondary | ICD-10-CM | POA: Diagnosis not present

## 2022-09-24 DIAGNOSIS — E785 Hyperlipidemia, unspecified: Secondary | ICD-10-CM

## 2022-09-24 DIAGNOSIS — I10 Essential (primary) hypertension: Secondary | ICD-10-CM

## 2022-09-24 LAB — POCT GLYCOSYLATED HEMOGLOBIN (HGB A1C): Hemoglobin A1C: 6.8 % — AB (ref 4.0–5.6)

## 2022-09-24 LAB — GLUCOSE, POCT (MANUAL RESULT ENTRY): POC Glucose: 133 mg/dl — AB (ref 70–99)

## 2022-09-24 MED ORDER — METFORMIN HCL 1000 MG PO TABS
1000.0000 mg | ORAL_TABLET | Freq: Two times a day (BID) | ORAL | 3 refills | Status: DC
  Filled 2022-09-24 – 2022-10-01 (×2): qty 60, 30d supply, fill #0
  Filled 2022-11-18: qty 60, 30d supply, fill #1
  Filled 2023-01-11: qty 60, 30d supply, fill #2
  Filled 2023-02-26: qty 60, 30d supply, fill #3

## 2022-09-24 MED ORDER — RIVAROXABAN 20 MG PO TABS
20.0000 mg | ORAL_TABLET | Freq: Every day | ORAL | 3 refills | Status: DC
  Filled 2022-09-24 – 2022-10-09 (×2): qty 30, 30d supply, fill #0
  Filled 2022-11-10 – 2022-11-17 (×2): qty 30, 30d supply, fill #1
  Filled 2022-12-15: qty 30, 30d supply, fill #2
  Filled 2023-01-11: qty 30, 30d supply, fill #3

## 2022-09-24 MED ORDER — GLIPIZIDE ER 10 MG PO TB24
10.0000 mg | ORAL_TABLET | Freq: Every day | ORAL | 0 refills | Status: DC
  Filled 2022-09-24: qty 90, 90d supply, fill #0

## 2022-09-24 MED ORDER — SPIRONOLACTONE 25 MG PO TABS
25.0000 mg | ORAL_TABLET | Freq: Every day | ORAL | 3 refills | Status: DC
  Filled 2022-09-24 – 2022-10-01 (×2): qty 30, 30d supply, fill #0
  Filled 2022-11-18: qty 30, 30d supply, fill #1
  Filled 2023-01-11: qty 30, 30d supply, fill #2
  Filled 2023-02-26: qty 30, 30d supply, fill #3

## 2022-09-24 NOTE — Progress Notes (Signed)
Paramedicine Encounter    Patient ID: [name], female    DOB: 08/28/1966, 56 y.o.   MRN: [id number]   BP 120/80 (BP Location: Left Arm, Patient Position: Sitting, Cuff Size: Normal)   Pulse 76   Resp 18   Wt (!) 312 lb (141.5 kg)   SpO2 96%   BMI 48.87 kg/m  Weight yesterday-not taken Last visit weight-312lb  ATF Ms. Dieter A&O x 4, skin W&D w/ good color.  She was very happy today and states she had a good report from here PCP.  Her A1C was 6.  She has missed only a couple doses from her past weeks regimen.  Med box reconciled x 1 week.  Pt denies chest pain or SOB.  No significant edema noted.  She reports that she is trying to make better nutritional choices.  Refills needed may not be approved until 1/3 of next week -I will call pharmacy back then for same. Home visit complete.   Renee Ramus, [place] 09/24/2022  Patient Care Team: [name], NP as PCP - General (Nurse Practitioner) Lelon Perla, MD as PCP - Cardiology (Cardiology) Lelon Perla, MD (Cardiology) Pccm, Md, MD as Consulting Physician (Pulmonary Disease) Deberah Pelton, NP as Nurse Practitioner (Cardiology)  Patient Active Problem List   Diagnosis Date Noted   Essential hypertension 05/26/2022   Hypokalemia 05/26/2022   CHF exacerbation ([place]) 90/30/0923   Acute diastolic CHF (congestive heart failure) ([place]) 11/10/2021   Acute on chronic congestive heart failure ([place]) 04/18/2021   Acute on chronic heart failure ([place]) 04/17/2021   Elevated troponin    Obesity hypoventilation syndrome ([place]) 12/15/2019   Depression 12/15/2019   ILD (interstitial lung disease) ([place]) 05/28/2019   Exertional angina 05/28/2019   AKI (acute kidney injury) ([place])    Hematochezia 07/14/2018   Acute posthemorrhagic anemia 07/14/2018   GIB (gastrointestinal bleeding) 07/14/2018   Persistent atrial fibrillation ([place])    Uncontrolled type 2 diabetes mellitus with hyperglycemia ([place])     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/2015   Pyrexia    Dyspnea 11/13/2015   Mediastinal adenopathy 11/13/2015   Abnormal CT scan, chest 11/11/2015   Chest pain 11/11/2015   Depression (emotion) 03/07/2015   Noninfectious gastroenteritis and colitis 01/02/2015   Sinusitis, chronic 01/02/2015   Midline low back pain without sciatica 09/10/2014   Bipolar 1 disorder, mixed, moderate ([place]) 07/02/2014   Stress incontinence 07/02/2014   Mania ([place]) 12/10/2013   Speech abnormality 12/08/2013   SVT (supraventricular tachycardia) 12/06/2013   H/O non-insulin dependent diabetes mellitus 11/28/2013   Pulmonary HTN, moderate to severe 11/03/2013   Acute on chronic diastolic congestive heart failure ([place]) 11/02/2013   Hypertensive heart disease 10/18/2013   Non-insulin dependent type 2 diabetes mellitus ([place]) 10/18/2013   Chronic diastolic heart failure ([place]) 07/23/2011   Class 3 severe obesity due to excess calories with serious comorbidity and body mass index (BMI) of 60.0 to 69.9 in adult ([place]) 02/19/2011   Non-ST elevation (NSTEMI) myocardial infarction ([place]) 04/29/2009   Bipolar disorder     Current Outpatient Medications:    amLODipine (NORVASC) 10 MG tablet, Take 10 mg by mouth daily., Disp: , Rfl:    carvedilol (COREG) 25 MG tablet, Take 1 tablet (25 mg total) by mouth 2 (two) times daily with a meal., Disp: 60 tablet, Rfl: 3   empagliflozin (JARDIANCE) 10 MG TABS tablet, Take 1 tablet (10 mg total) by mouth daily before breakfast., Disp: 30 tablet, Rfl: 6  glipiZIDE (GLUCOTROL) 10 MG tablet, Take 10 mg by mouth daily before breakfast., Disp: , Rfl:    hydrALAZINE (APRESOLINE) 25 MG tablet, Take 1 tablet (25 mg total) by mouth 3 (three) times daily., Disp: 90 tablet, Rfl: 3   hydrOXYzine (VISTARIL) 25 MG capsule, Take 1 capsule (25 mg total) by mouth every 8 (eight) hours as needed., Disp: 60 capsule, Rfl: 1   Incontinence Supply Disposable (INCONTINENCE BRIEF LARGE) MISC, Please  provide patient with insurance approved incontinence supplies/briefs, Disp: 18 each, Rfl: 6   metFORMIN (GLUCOPHAGE) 1000 MG tablet, Take 1 tablet (1,000 mg total) by mouth 2 (two) times daily with a meal., Disp: 60 tablet, Rfl: 3   risperiDONE (RISPERDAL) 2 MG tablet, Take 1 tablet (2 mg total) by mouth at bedtime., Disp: 30 tablet, Rfl: 0   rivaroxaban (XARELTO) 20 MG TABS tablet, Take 1 tablet (20 mg total) by mouth daily with supper., Disp: 30 tablet, Rfl: 3   sacubitril-valsartan (ENTRESTO) 97-103 MG, Take 1 tablet by mouth 2 (two) times daily., Disp: 60 tablet, Rfl: 10   spironolactone (ALDACTONE) 25 MG tablet, Take 1 tablet (25 mg total) by mouth daily., Disp: 30 tablet, Rfl: 3   torsemide (DEMADEX) 20 MG tablet, Take 3 tablets daily alternating with 2 tablets daily, Disp: 360 tablet, Rfl: 0   Accu-Chek Softclix Lancets lancets, Use up to 4 times daily (Patient not taking: Reported on 09/24/2022), Disp: 100 each, Rfl: 0   Blood Glucose Monitoring Suppl (ACCU-CHEK GUIDE) w/Device KIT, Use to check blood sugar three times daily. (Patient not taking: Reported on 09/24/2022), Disp: 1 kit, Rfl: 0   Blood Pressure Monitor DEVI, Please provide patient with insurance approved blood pressure monitor I10.0 (Patient not taking: Reported on 09/24/2022), Disp: 1 each, Rfl: 0   glucose blood (ACCU-CHEK GUIDE) test strip, Use as directed up to 4 times daily (Patient not taking: Reported on 09/24/2022), Disp: 100 each, Rfl: 0   Naphazoline-Pheniramine (EYE ALLERGY RELIEF OP), Place 1 drop into both eyes daily as needed (allergy). (Patient not taking: Reported on 09/24/2022), Disp: , Rfl:  Allergies  Allergen Reactions   Acetaminophen Other (See Comments)    Seizure-like "fits" as a child   Caffeine     Tense, anxiety, increased urination   [place] [Dapagliflozin] Other (See Comments)    Hallucinations, drop in blood sugar   Lisinopril Rash    Rash with lisinopril; but fosinopril is ok per patient       Social History   Socioeconomic History   Marital status: Single    Spouse name: Not on file   Number of children: 0   Years of education: 18   Highest education level: Not on file  Occupational History   Occupation: unemployed  Tobacco Use   Smoking status: Never   Smokeless tobacco: Never  Vaping Use   Vaping Use: Never used  Substance and Sexual Activity   Alcohol use: No   Drug use: No   Sexual activity: Not Currently    Birth control/protection: None  Other Topics Concern   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 But ran out of money and is no longer attending school. (Note patient has bipolar disorder).      Born in [place] but lived in [place] before coming back to [place].       Primary language is Arabic.  Lives with mother and brother.   Social Determinants of Health   Financial Resource Strain: Low Risk  (05/26/2022)   Overall Financial Resource Strain (CARDIA)    Difficulty of Paying Living Expenses: Not very hard  Food Insecurity: No Food Insecurity (05/26/2022)   Hunger Vital Sign    Worried About Running Out of Food in the Last Year: Never true    Ran Out of Food in the Last Year: Never true  Transportation Needs: No Transportation Needs (05/26/2022)   PRAPARE - Hydrologist (Medical): No    Lack of Transportation (Non-Medical): No  Physical Activity: Not on file  Stress: No Stress Concern Present (07/15/2018)   [place]    Feeling of Stress : Only a little  Social Connections: Not on file  Intimate Partner Violence: Not on file    Physical Exam      Future Appointments  Date Time Provider [place]  10/15/2022  2:20 PM Larey Dresser, MD [facility] None  12/22/2022  1:30 PM [name], NP [facility] None  12/23/2022  8:20 AM  [name], Denice Bors, MD [facility] None       Renee Ramus, [place] [facility] Paramedic  09/24/22

## 2022-09-24 NOTE — Progress Notes (Signed)
Patient ID: [name], female   DOB: [birth year], 56 y.o.   MRN: [id number]    [name], is a 56 y.o. female  [phone]  IRS:[id number]  DOB - May 22, 1966  Chief Complaint  Patient presents with   Follow-up   Diabetes       Subjective:   [name] is a 56 y.o. female here today for med RF on metformin and A1C check.  No new issues or concerns.  No CP/SOB/ HA/dizziness  Seen by cardiology 09/03/2022: ASSESSMENT & PLAN: 1. Chronic Diastolic CHF:  Echo (0/09) with EF 60-65%, severe LVH, Septal thickenness over 49m but difficult to measure, RV normal.   Severe LVH may be secondary to HTN, but also concerned for possible hypertrophic cardiomyopathy (less likely cardiac amyloidosis).  Father and brother both with sudden death is concerning for familial HCM.  Gene testing +. 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 cMRI w/ normal sized RV and function, RVEF 51%, Severe basal septal hypertrophy 17 mm LVEF normal 64% no RWMAs, + abnormal delayed gadolinium images with mottled uptake in apex and septum and more defined area of moderate uptake in mid inferior wall. PET scan recommended but pt has m[employer](not covered at [employer]. NYHA Class II-early III, confounded by obesity/deconditioning/sedentary lifestyle. Appears mildly fluid overloaded on exam. Wt up. BP mildly elevated.  - Increase Entresto to 97-103 mg bid  - Continue torsemide 40 mg bid. Check BMP and BNP today. May need dose titration if BNP elevated.  - Continue Jardiance 10 mg                                                                                                                                                                    - Continue spironolactone 25 mg daily  - Continue hydralazine 25 mg tid  - Continue carvedilol 25 mg bid. - Refer to Dr. JBroadus Johnfor abnormal gene testing  - She has had multiple hospitalizations for  a/c CHF in the last year. I recommended Cardiomems placement. She continues to refuse this.  2. Hypertension:  - mildly elevated, GDMT per above, increase Entresto  - BMP today  3. Atrial fibrillation: Chronic since at least 2019.  Unlikely to cardiovert and stay in NSR at this point given chronicity and noncompliance with CPAP.  Rate controlled.  - Continue Coreg 25 mg bid  - Continue Xarelto.  Denies abnormal bleeding   4. OSA: Not compliant w/ CPAP.  - encouraged her to improve compliance   5. Morbid Obesity: Body mass index is 48.87 kg/m. - referral for semaglutide. Pt agrees to this  6. DM II : Poor control.  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  Cardiolite in 2017 showed no ischemia.  With an anomalous right, no attributable symptoms (no chest pain), and normal Cardiolite in past, would medically manage this.  No problems updated.  ALLERGIES: Allergies  Allergen Reactions   Acetaminophen Other (See Comments)    Seizure-like "fits" as a child   Caffeine     Tense, anxiety, increased urination   [place] [Dapagliflozin] Other (See Comments)    Hallucinations, drop in blood sugar   Lisinopril Rash    Rash with lisinopril; but fosinopril is ok per patient    PAST MEDICAL HISTORY: Past Medical History:  Diagnosis Date   Acute on chronic diastolic congestive heart failure ([place]) 11/02/2013   10/03/2015, 11/13/2015, 08/03/2017   Benign essential HTN 11/28/2013   Bipolar disease, chronic ([place])    Chest pain    a. 2012 Myoview: EF 63%, no isch/infarct;  b. 04/2016 Lexiscan MV: EF 73%, no ischemia/infarct-->Low risk.   Chronic diastolic CHF (congestive heart failure) ([place]) 07/23/2011   a. 2015 Echo: EF 55-60%, Gr2 DD;  b. 09/2015 Echo: EF 60-65%, no rwma, mod dil LA, PASP 78mHg.   Cor pulmonale (chronic) (HCC)    History of thyrotoxicosis    HTN (hypertension) 11/28/2013    Hypertensive heart disease 10/18/2013   Hypoglycemia    Insulin dependent type 2 diabetes mellitus, uncontrolled    Mediastinal adenopathy    Morbid obesity due to excess calories (HCarmichaels 02/19/2011   Morbid obesity with BMI of 50.0-59.9, adult (HCC)    OSA (obstructive sleep apnea) 03/06/2011   Persistent atrial fibrillation (HCarm[facility] [place] 12/09/2017   Pulmonary HTN, moderate to severe 11/03/2013   Sinusitis, chronic 01/02/2015   SVT (supraventricular tachycardia) 12/06/2013   Uncontrolled type 2 diabetes mellitus with hyperglycemia (HHitch[facility]     MEDICATIONS AT HOME: Prior to Admission medications   Medication Sig Start Date End Date Taking? Authorizing Provider  Accu-Chek Softclix Lancets lancets Use up to 4 times daily 05/28/22  Yes JDomenic Polite MD  amLODipine (NORVASC) 10 MG tablet Take 10 mg by mouth daily.   Yes [provider]  Blood Glucose Monitoring Suppl (ACCU-CHEK GUIDE) w/Device KIT Use to check blood sugar three times daily. 05/28/22  Yes JDomenic Polite MD  Blood Pressure Monitor DEVI Please provide patient with insurance approved blood pressure monitor I10.0 07/21/20  Yes FGildardo Pounds NP  carvedilol (COREG) 25 MG tablet Take 1 tablet (25 mg total) by mouth 2 (two) times daily with a meal. 06/24/22  Yes Clegg, Amy D, NP  empagliflozin (JARDIANCE) 10 MG TABS tablet Take 1 tablet (10 mg total) by mouth daily before breakfast. 07/08/22  Yes Milford, Jessica M, FNP  glipiZIDE (GLUCOTROL) 10 MG tablet Take 10 mg by mouth daily before breakfast.   Yes [provider]  glucose blood (ACCU-CHEK GUIDE) test strip Use as directed up to 4 times daily 05/28/22  Yes JDomenic Polite MD  hydrALAZINE (APRESOLINE) 25 MG tablet Take 1 tablet (25 mg total) by mouth 3 (three) times daily. 09/03/22 01/01/23 Yes Simmons, Brittainy M, PA-C  hydrOXYzine (VISTARIL) 25 MG capsule Take 1 capsule (25 mg total) by mouth every 8 (eight) hours as needed. 06/22/22  Yes FGildardo Pounds NP  Incontinence  Supply Disposable (INCONTINENCE BRIEF LARGE) MISC Please provide patient with insurance approved incontinence supplies/briefs 07/02/20  Yes FGildardo Pounds NP  Naphazoline-Pheniramine (EYE ALLERGY RELIEF OP) Place 1 drop into both eyes daily as needed (allergy).   Yes  [provider]  risperiDONE (RISPERDAL) 2 MG tablet Take 1 tablet (2 mg total) by mouth at bedtime. 09/04/22  Yes Newlin, Charlane Ferretti, MD  sacubitril-valsartan (ENTRESTO) 97-103 MG Take 1 tablet by mouth 2 (two) times daily. 09/03/22  Yes Lyda Jester M, PA-C  torsemide (DEMADEX) 20 MG tablet Take 3 tablets daily alternating with 2 tablets daily 08/06/22  Yes Larey Dresser, MD  metFORMIN (GLUCOPHAGE) 1000 MG tablet Take 1 tablet (1,000 mg total) by mouth 2 (two) times daily with a meal. 09/24/22   Kyleigh Nannini, Dionne Bucy, PA-C  rivaroxaban (XARELTO) 20 MG TABS tablet Take 1 tablet (20 mg total) by mouth daily with supper. 09/24/22 12/23/22  Argentina Donovan, PA-C  spironolactone (ALDACTONE) 25 MG tablet Take 1 tablet (25 mg total) by mouth daily. 09/24/22   Kysen Wetherington, Dionne Bucy, PA-C    ROS: Neg HEENT Neg resp Neg cardiac Neg GI Neg GU Neg MS Neg psych Neg neuro  Objective:   Vitals:   09/24/22 1347  BP: 112/71  Pulse: 91  Resp: 16  Temp: 98.1 F (36.7 C)  TempSrc: Oral  SpO2: 96%  Weight: (!) 312 lb 3.2 oz (141.6 kg)   Exam General appearance : Awake, alert, not in any distress. Speech Clear. Not toxic looking HEENT: Atraumatic and Normocephalic Neck: Supple, no JVD. No cervical lymphadenopathy.  Chest: Good air entry bilaterally, CTAB.  No rales/rhonchi/wheezing CVS: S1 S2 regular, no murmurs.  Extremities: B/L Lower Ext shows no edema, both legs are warm to touch Neurology: Awake alert, and oriented X 3, CN II-XII intact, Non focal Skin: No Rash  Data Review Lab Results  Component Value Date   HGBA1C 6.8 (A) 09/24/2022   HGBA1C 10.5 (H) 05/25/2022   HGBA1C 6.2 (H) 11/11/2021    Assessment & Plan    1. Uncontrolled type 2 diabetes mellitus with hyperglycemia (HCC) Much improved!!!  Continue jardiance and metformin - Glucose (CBG) - HgB A1c - metFORMIN (GLUCOPHAGE) 1000 MG tablet; Take 1 tablet (1,000 mg total) by mouth 2 (two) times daily with a meal.  Dispense: 60 tablet; Refill: 3 - spironolactone (ALDACTONE) 25 MG tablet; Take 1 tablet (25 mg total) by mouth daily.  Dispense: 30 tablet; Refill: 3  2. Persistent atrial fibrillation - rivaroxaban (XARELTO) 20 MG TABS tablet; Take 1 tablet (20 mg total) by mouth daily with supper.  Dispense: 30 tablet; Refill: 3  3. Dyslipidemia, goal LDL below 70  4. Essential hypertension Per cardiology-coreg and just increased entresto    Return in about 3 months (around 12/24/2022) for PCP for chronic conditions.  The patient was given clear instructions to go to ER or return to medical center if symptoms don't improve, worsen or new problems develop. The patient verbalized understanding. The patient was told to call to get lab results if they haven't heard anything in the next week.      Freeman Caldron, PA-C [facility] [place], [place]   09/24/2022, 2:09 PM

## 2022-09-25 ENCOUNTER — Other Ambulatory Visit: Payer: Self-pay

## 2022-10-01 ENCOUNTER — Telehealth (HOSPITAL_COMMUNITY): Payer: Self-pay | Admitting: Emergency Medicine

## 2022-10-01 ENCOUNTER — Other Ambulatory Visit: Payer: Self-pay

## 2022-10-01 ENCOUNTER — Other Ambulatory Visit (HOSPITAL_COMMUNITY): Payer: Self-pay | Admitting: Emergency Medicine

## 2022-10-01 ENCOUNTER — Telehealth (HOSPITAL_COMMUNITY): Payer: Self-pay | Admitting: *Deleted

## 2022-10-01 NOTE — Telephone Encounter (Signed)
Dede with paramedicine called to report pts weight is up 8lbs x 1 week. Systolic bp 728 heart rate 85 and no significant edema.  Routed to [employer] for advice

## 2022-10-01 NOTE — Telephone Encounter (Signed)
Called and spoke with [place] mother to advise her of med change per [facility].  Pt is to increase Torsemide to '80mg'$  daily and repeat labs in 7-10 days.  I will do a home visit to make change to her med box at 4:00 tomorrow.    Renee Ramus, [place] 10/01/2022

## 2022-10-01 NOTE — Progress Notes (Signed)
Paramedicine Encounter    Patient ID: [name], female    DOB: 05-Aug-1966, 57 y.o.   MRN: [id number]   There were no vitals taken for this visit. Weight yesterday-not taken Last visit weight-312lb  ATF Ms. Cory A&O x 4, skin W&D w/ good color.  She denies chest pain or SOB.  Lung sounds clear and equal bilat.  No edema noted.  Pt. Is up 8lbs since 12/28 visit.  She has also had poor med compliance..."because I just didn't want to take it."  Called HF triage and LVM.  Received message from Triage advising to increase Torsemide to 75m daily.  Med box was reconciled x 1 week.  I will go back by pt's house tomorrow at 4 to make med change to reflect 872mdaily.  Pt. Is to have repeat lab work in 8-10 days.  Home visit complete.    DeRenee RamusEMCorning/12/2022    Patient Care Team: FlGildardo PoundsNP as PCP - General (Nurse Practitioner) CrLelon PerlaMD as PCP - Cardiology (Cardiology) CrLelon PerlaMD (Cardiology) Pccm, Md, MD as Consulting Physician (Pulmonary Disease) ClDeberah PeltonNP as Nurse Practitioner (Cardiology)  Patient Active Problem List   Diagnosis Date Noted   Essential hypertension 05/26/2022   Hypokalemia 05/26/2022   CHF exacerbation (HCSturgis0827/51/7001 Acute diastolic CHF (congestive heart failure) (HCForestbrook02/13/2023   Acute on chronic congestive heart failure (HCHot Springs07/22/2022   Acute on chronic heart failure (HCBrookside07/21/2022   Elevated troponin    Obesity hypoventilation syndrome (HCOriole Beach03/19/2021   Depression 12/15/2019   ILD (interstitial lung disease) (HCGrinnell08/30/2020   Exertional angina 05/28/2019   AKI (acute kidney injury) (HCRosharon   Hematochezia 07/14/2018   Acute posthemorrhagic anemia 07/14/2018   GIB (gastrointestinal bleeding) 07/14/2018   Persistent atrial fibrillation (HCJackson   Uncontrolled type 2 diabetes mellitus with hyperglycemia (HCKeizer   Mycobacterium avium complex (HCDilkon03/17/2017   Pyrexia     Dyspnea 11/13/2015   Mediastinal adenopathy 11/13/2015   Abnormal CT scan, chest 11/11/2015   Chest pain 11/11/2015   Depression (emotion) 03/07/2015   Noninfectious gastroenteritis and colitis 01/02/2015   Sinusitis, chronic 01/02/2015   Midline low back pain without sciatica 09/10/2014   Bipolar 1 disorder, mixed, moderate (HCNewport Beach10/01/2014   Stress incontinence 07/02/2014   Mania (HCShungnak03/15/2015   Speech abnormality 12/08/2013   SVT (supraventricular tachycardia) 12/06/2013   H/O non-insulin dependent diabetes mellitus 11/28/2013   Pulmonary HTN, moderate to severe 11/03/2013   Acute on chronic diastolic congestive heart failure (HCNaples02/01/2014   Hypertensive heart disease 10/18/2013   Non-insulin dependent type 2 diabetes mellitus (HCCornfields01/21/2015   Chronic diastolic heart failure (HCCollin10/25/2012   Class 3 severe obesity due to excess calories with serious comorbidity and body mass index (BMI) of 60.0 to 69.9 in adult ([facility]/24/2012   Non-ST elevation (NSTEMI) myocardial infarction (HCIdaville08/10/2008   Bipolar disorder     Current Outpatient Medications:    Accu-Chek Softclix Lancets lancets, Use up to 4 times daily (Patient not taking: Reported on 09/24/2022), Disp: 100 each, Rfl: 0   amLODipine (NORVASC) 10 MG tablet, Take 10 mg by mouth daily., Disp: , Rfl:    Blood Glucose Monitoring Suppl (ACCU-CHEK GUIDE) w/Device KIT, Use to check blood sugar three times daily. (Patient not taking: Reported on 09/24/2022), Disp: 1 kit, Rfl: 0   Blood Pressure Monitor DEVI, Please provide patient with insurance approved blood pressure monitor  I10.0 (Patient not taking: Reported on 09/24/2022), Disp: 1 each, Rfl: 0   carvedilol (COREG) 25 MG tablet, Take 1 tablet (25 mg total) by mouth 2 (two) times daily with a meal., Disp: 60 tablet, Rfl: 3   empagliflozin (JARDIANCE) 10 MG TABS tablet, Take 1 tablet (10 mg total) by mouth daily before breakfast., Disp: 30 tablet, Rfl: 6   glipiZIDE  (GLUCOTROL XL) 10 MG 24 hr tablet, Take 1 tablet (10 mg total) by mouth daily with breakfast., Disp: 90 tablet, Rfl: 0   glucose blood (ACCU-CHEK GUIDE) test strip, Use as directed up to 4 times daily (Patient not taking: Reported on 09/24/2022), Disp: 100 each, Rfl: 0   hydrALAZINE (APRESOLINE) 25 MG tablet, Take 1 tablet (25 mg total) by mouth 3 (three) times daily., Disp: 90 tablet, Rfl: 3   hydrOXYzine (VISTARIL) 25 MG capsule, Take 1 capsule (25 mg total) by mouth every 8 (eight) hours as needed., Disp: 60 capsule, Rfl: 1   Incontinence Supply Disposable (INCONTINENCE BRIEF LARGE) MISC, Please provide patient with insurance approved incontinence supplies/briefs, Disp: 18 each, Rfl: 6   metFORMIN (GLUCOPHAGE) 1000 MG tablet, Take 1 tablet (1,000 mg total) by mouth 2 (two) times daily with a meal., Disp: 60 tablet, Rfl: 3   Naphazoline-Pheniramine (EYE ALLERGY RELIEF OP), Place 1 drop into both eyes daily as needed (allergy). (Patient not taking: Reported on 09/24/2022), Disp: , Rfl:    risperiDONE (RISPERDAL) 2 MG tablet, Take 1 tablet (2 mg total) by mouth at bedtime., Disp: 30 tablet, Rfl: 0   rivaroxaban (XARELTO) 20 MG TABS tablet, Take 1 tablet (20 mg total) by mouth daily with supper., Disp: 30 tablet, Rfl: 3   sacubitril-valsartan (ENTRESTO) 97-103 MG, Take 1 tablet by mouth 2 (two) times daily., Disp: 60 tablet, Rfl: 10   spironolactone (ALDACTONE) 25 MG tablet, Take 1 tablet (25 mg total) by mouth daily., Disp: 30 tablet, Rfl: 3   torsemide (DEMADEX) 20 MG tablet, Take 3 tablets daily alternating with 2 tablets daily, Disp: 360 tablet, Rfl: 0 Allergies  Allergen Reactions   Acetaminophen Other (See Comments)    Seizure-like "fits" as a child   Caffeine     Tense, anxiety, increased urination   [place] [Dapagliflozin] Other (See Comments)    Hallucinations, drop in blood sugar   Lisinopril Rash    Rash with lisinopril; but fosinopril is ok per patient      Social History    Socioeconomic History   Marital status: Single    Spouse name: Not on file   Number of children: 0   Years of education: 18   Highest education level: Not on file  Occupational History   Occupation: unemployed  Tobacco Use   Smoking status: Never   Smokeless tobacco: Never  Vaping Use   Vaping Use: Never used  Substance and Sexual Activity   Alcohol use: No   Drug use: No   Sexual activity: Not Currently    Birth control/protection: None  Other Topics Concern   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 But ran out of money and is no longer attending school. (Note patient has bipolar disorder).      Born in [place] but lived in [place] before coming back to [place].       Primary language is Arabic. Lives with mother and brother.  Social Determinants of Health   Financial Resource Strain: Low Risk  (05/26/2022)   Overall Financial Resource Strain (CARDIA)    Difficulty of Paying Living Expenses: Not very hard  Food Insecurity: No Food Insecurity (05/26/2022)   Hunger Vital Sign    Worried About Running Out of Food in the Last Year: Never true    Ran Out of Food in the Last Year: Never true  Transportation Needs: No Transportation Needs (05/26/2022)   PRAPARE - Hydrologist (Medical): No    Lack of Transportation (Non-Medical): No  Physical Activity: Not on file  Stress: No Stress Concern Present (07/15/2018)   [place]    Feeling of Stress : Only a little  Social Connections: Not on file  Intimate Partner Violence: Not on file    Physical Exam      Future Appointments  Date Time Provider [place]  10/15/2022  2:20 PM Larey Dresser, MD [facility] None  12/22/2022  1:30 PM [name], NP [facility] None  12/23/2022  8:20 AM [name], Denice Bors, MD  [facility] None       Renee Ramus, [place] [facility] Paramedic  10/01/22

## 2022-10-02 ENCOUNTER — Other Ambulatory Visit: Payer: Self-pay

## 2022-10-02 ENCOUNTER — Other Ambulatory Visit (HOSPITAL_COMMUNITY): Payer: Self-pay | Admitting: Emergency Medicine

## 2022-10-02 NOTE — Progress Notes (Signed)
Picked up and delivered:  Glipizide, Hydroxyzine, Metformin, Spironolactone.   Med change to '80mg'$ . Toresemide daily by [facility]. Changes made to med box that was filled yesterday.    Renee Ramus, [place] 10/02/2022

## 2022-10-05 ENCOUNTER — Other Ambulatory Visit: Payer: Self-pay

## 2022-10-08 ENCOUNTER — Other Ambulatory Visit: Payer: Self-pay

## 2022-10-09 ENCOUNTER — Telehealth (HOSPITAL_COMMUNITY): Payer: Self-pay | Admitting: *Deleted

## 2022-10-09 ENCOUNTER — Other Ambulatory Visit (HOSPITAL_COMMUNITY): Payer: Self-pay | Admitting: Adult Health

## 2022-10-09 ENCOUNTER — Other Ambulatory Visit (HOSPITAL_COMMUNITY): Payer: Self-pay | Admitting: Emergency Medicine

## 2022-10-09 ENCOUNTER — Other Ambulatory Visit: Payer: Self-pay

## 2022-10-09 ENCOUNTER — Other Ambulatory Visit: Payer: Self-pay | Admitting: Nurse Practitioner

## 2022-10-09 ENCOUNTER — Other Ambulatory Visit: Payer: Self-pay | Admitting: Family Medicine

## 2022-10-09 DIAGNOSIS — F32A Depression, unspecified: Secondary | ICD-10-CM

## 2022-10-09 DIAGNOSIS — L299 Pruritus, unspecified: Secondary | ICD-10-CM

## 2022-10-09 DIAGNOSIS — I5032 Chronic diastolic (congestive) heart failure: Secondary | ICD-10-CM

## 2022-10-09 MED ORDER — RISPERIDONE 2 MG PO TABS
2.0000 mg | ORAL_TABLET | Freq: Every day | ORAL | 3 refills | Status: DC
  Filled 2022-10-09: qty 30, 30d supply, fill #0
  Filled 2022-11-18: qty 30, 30d supply, fill #1
  Filled 2023-02-26 – 2023-03-08 (×3): qty 30, 30d supply, fill #2

## 2022-10-09 MED ORDER — HYDROXYZINE PAMOATE 25 MG PO CAPS
25.0000 mg | ORAL_CAPSULE | Freq: Three times a day (TID) | ORAL | 1 refills | Status: DC | PRN
  Filled 2022-10-09 – 2022-12-15 (×2): qty 60, 20d supply, fill #0
  Filled 2023-01-11: qty 60, 20d supply, fill #1

## 2022-10-09 NOTE — Telephone Encounter (Signed)
Requested Prescriptions  Pending Prescriptions Disp Refills   hydrOXYzine (VISTARIL) 25 MG capsule 60 capsule 1    Sig: Take 1 capsule (25 mg total) by mouth every 8 (eight) hours as needed.     Ear, Nose, and Throat:  Antihistamines 2 Failed - 10/09/2022 11:40 AM      Failed - Cr in normal range and within 360 days    Creat  Date Value Ref Range Status  08/06/2016 0.81 0.50 - 1.10 mg/dL Final   Creatinine, Ser  Date Value Ref Range Status  09/03/2022 1.20 (H) 0.44 - 1.00 mg/dL Final   Creatinine, Urine  Date Value Ref Range Status  08/06/2018 197.25 mg/dL Final    Comment:    Performed at [facility], [place] [address], [place], [place] [address]         Passed - Valid encounter within last 12 months    Recent Outpatient Visits           2 weeks ago Uncontrolled type 2 diabetes mellitus with hyperglycemia [facility])   [place], Dionne Bucy, [place]   3 months ago Hospital discharge follow-up   [place], [place] W, NP   4 months ago Hypertensive heart disease with chronic diastolic congestive heart failure [facility])   [place], Charlane Ferretti, MD   1 year ago Hypertensive heart disease with chronic diastolic congestive heart failure [facility])   [place] [name], NP   1 year ago Hospital discharge follow-up   [place], [name] W, NP       Future Appointments             In 2 months [name], NP [place]   In 2 months [place], Denice Bors, MD [place] [facility] [place]. [place]

## 2022-10-09 NOTE — Progress Notes (Signed)
Paramedicine Encounter    Patient ID: [name], female    DOB: 1965/11/16, 57 y.o.   MRN: [id number]   Complaints Stuffy nose w/ productive cough   Assessment A&O x 4, skin W&D w/ good color.  No CP or SOB. No edema   Compliance with meds not taking noon Hydralazine  Pill box filled 1  week  Refills needed YES   Meds changes since last visit none    Social changes none CBG 166    BP 100/60 (BP Location: Left Arm, Patient Position: Sitting, Cuff Size: Large)   Pulse 100   Resp 20   Wt (!) 310 lb 6.4 oz (140.8 kg)   SpO2 95%   BMI 48.62 kg/m  Weight yesterday-not taken Last visit weight-317lb   ATF [name] A&O x 4, skin W &D w/ good color.  She denies chest pain or SOB.  No edema noted.  BP 100/60 and weight down 7lbs in 1 week.  Pt is currently on alternating doses of  Torsemide 61m/40mg every other day.  Sent message to triage to inquire if any changes desired and not indicated at this time [facility] reconciled x 1 week.  Reminded pt of upcoming appt w/ Dr. MAundra Dubin1/18 @ 2:20.  Pt. Is aware of same.  ACTION: Home visit completed  DSkipper [id number]/24  Patient Care Team: FGildardo Pounds NP as PCP - General (Nurse Practitioner) CLelon Perla MD as PCP - Cardiology (Cardiology) CLelon Perla MD (Cardiology) Pccm, Md, MD as Consulting Physician (Pulmonary Disease) CDeberah Pelton NP as Nurse Practitioner (Cardiology)  Patient Active Problem List   Diagnosis Date Noted   Essential hypertension 05/26/2022   Hypokalemia 05/26/2022   CHF exacerbation (HShelby [id number]  Acute diastolic CHF (congestive heart failure) (HStrut[place] 11/10/2021   Acute on chronic congestive heart failure (HGray 04/18/2021   Acute on chronic heart failure (HMorrison[place] 04/17/2021   Elevated troponin    Obesity hypoventilation syndrome (H[place] 12/15/2019   Depression 12/15/2019   ILD (interstitial lung disease) (HEdie 05/28/2019    Exertional angina 05/28/2019   AKI (acute kidney injury) (HTenino    Hematochezia 07/14/2018   Acute posthemorrhagic anemia 07/14/2018   GIB (gastrointestinal bleeding) 07/14/2018   Persistent atrial fibrillation (HPretty Pra[place]    Uncontrolled type 2 diabetes mellitus with hyperglycemia (HHeron [place]    Mycobacterium avium complex (HSugar [place] 12/13/2015   Pyrexia    Dyspnea 11/13/2015   Mediastinal adenopathy 11/13/2015   Abnormal CT scan, chest 11/11/2015   Chest pain 11/11/2015   Depression (emotion) 03/07/2015   Noninfectious gastroenteritis and colitis 01/02/2015   Sinusitis, chronic 01/02/2015   Midline low back pain without sciatica 09/10/2014   Bipolar 1 disorder, mixed, moderate (HLa[place] 07/02/2014   Stress incontinence 07/02/2014   Mania (HWall[place] 12/10/2013   Speech abnormality 12/08/2013   SVT (supraventricular tachycardia) 12/06/2013   H/O non-insulin dependent diabetes mellitus 11/28/2013   Pulmonary HTN, moderate to severe 11/03/2013   Acute on chronic diastolic congestive heart failure (HVenedy 11/02/2013   Hypertensive heart disease 10/18/2013   Non-insulin dependent type 2 diabetes mellitus (HHar[place] 10/18/2013   Chronic diastolic heart failure (HGlassmanor 07/23/2011   Class 3 severe obesity due to excess calories with serious comorbidity and body mass index (BMI) of 60.0 to 69.9 in adult (HMeadowlands 02/19/2011   Non-ST elevation (NSTEMI) myocardial infarction (HDiscovery Har[place] 04/29/2009   Bipolar disorder     Current Outpatient Medications:    amLODipine (NORVASC) 10 MG tablet, Take  10 mg by mouth daily., Disp: , Rfl:    carvedilol (COREG) 25 MG tablet, Take 1 tablet (25 mg total) by mouth 2 (two) times daily with a meal., Disp: 60 tablet, Rfl: 3   empagliflozin (JARDIANCE) 10 MG TABS tablet, Take 1 tablet (10 mg total) by mouth daily before breakfast., Disp: 30 tablet, Rfl: 6   glipiZIDE (GLUCOTROL XL) 10 MG 24 hr tablet, Take 1 tablet (10 mg total) by mouth daily with breakfast., Disp: 90 tablet, Rfl: 0   hydrALAZINE  (APRESOLINE) 25 MG tablet, Take 1 tablet (25 mg total) by mouth 3 (three) times daily., Disp: 90 tablet, Rfl: 3   Incontinence Supply Disposable (INCONTINENCE BRIEF LARGE) MISC, Please provide patient with insurance approved incontinence supplies/briefs, Disp: 18 each, Rfl: 6   metFORMIN (GLUCOPHAGE) 1000 MG tablet, Take 1 tablet (1,000 mg total) by mouth 2 (two) times daily with a meal., Disp: 60 tablet, Rfl: 3   rivaroxaban (XARELTO) 20 MG TABS tablet, Take 1 tablet (20 mg total) by mouth daily with supper., Disp: 30 tablet, Rfl: 3   sacubitril-valsartan (ENTRESTO) 97-103 MG, Take 1 tablet by mouth 2 (two) times daily., Disp: 60 tablet, Rfl: 10   spironolactone (ALDACTONE) 25 MG tablet, Take 1 tablet (25 mg total) by mouth daily., Disp: 30 tablet, Rfl: 3   Accu-Chek Softclix Lancets lancets, Use up to 4 times daily (Patient not taking: Reported on 10/09/2022), Disp: 100 each, Rfl: 0   Blood Glucose Monitoring Suppl (ACCU-CHEK GUIDE) w/Device KIT, Use to check blood sugar three times daily. (Patient not taking: Reported on 09/24/2022), Disp: 1 kit, Rfl: 0   Blood Pressure Monitor DEVI, Please provide patient with insurance approved blood pressure monitor I10.0 (Patient not taking: Reported on 09/24/2022), Disp: 1 each, Rfl: 0   glucose blood (ACCU-CHEK GUIDE) test strip, Use as directed up to 4 times daily (Patient not taking: Reported on 09/24/2022), Disp: 100 each, Rfl: 0   hydrOXYzine (VISTARIL) 25 MG capsule, Take 1 capsule (25 mg total) by mouth every 8 (eight) hours as needed., Disp: 60 capsule, Rfl: 1   Naphazoline-Pheniramine (EYE ALLERGY RELIEF OP), Place 1 drop into both eyes daily as needed (allergy). (Patient not taking: Reported on 09/24/2022), Disp: , Rfl:    risperiDONE (RISPERDAL) 2 MG tablet, Take 1 tablet (2 mg total) by mouth at bedtime., Disp: 30 tablet, Rfl: 3   torsemide (DEMADEX) 20 MG tablet, Take 3 tablets daily alternating with 2 tablets daily, Disp: 360 tablet, Rfl:  0 Allergies  Allergen Reactions   Acetaminophen Other (See Comments)    Seizure-like "fits" as a child   Caffeine     Tense, anxiety, increased urination   [place] [Dapagliflozin] Other (See Comments)    Hallucinations, drop in blood sugar   Lisinopril Rash    Rash with lisinopril; but fosinopril is ok per patient     Social History   Socioeconomic History   Marital status: Single    Spouse name: Not on file   Number of children: 0   Years of education: 18   Highest education level: Not on file  Occupational History   Occupation: unemployed  Tobacco Use   Smoking status: Never   Smokeless tobacco: Never  Vaping Use   Vaping Use: Never used  Substance and Sexual Activity   Alcohol use: No   Drug use: No   Sexual activity: Not Currently    Birth control/protection: None  Other Topics Concern   Not on file  Social History Narrative  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 But ran out of money and is no longer attending school. (Note patient has bipolar disorder).      Born in [place] but lived in [place] before coming back to [place].       Primary language is Arabic. Lives with mother and brother.   Social Determinants of Health   Financial Resource Strain: Low Risk  (05/26/2022)   Overall Financial Resource Strain (CARDIA)    Difficulty of Paying Living Expenses: Not very hard  Food Insecurity: No Food Insecurity (05/26/2022)   Hunger Vital Sign    Worried About Running Out of Food in the Last Year: Never true    Ran Out of Food in the Last Year: Never true  Transportation Needs: No Transportation Needs (05/26/2022)   PRAPARE - Hydrologist (Medical): No    Lack of Transportation (Non-Medical): No  Physical Activity: Not on file  Stress: No Stress Concern Present (07/15/2018)   [place]    Feeling of  Stress : Only a little  Social Connections: Not on file  Intimate Partner Violence: Not on file    Physical Exam      Future Appointments  Date Time Provider [place]  10/15/2022  2:20 PM Larey Dresser, MD [facility] None  12/22/2022  1:30 PM [name], NP [facility] None  12/23/2022  8:20 AM [name], Denice Bors, MD [facility] None

## 2022-10-09 NOTE — Telephone Encounter (Signed)
Dede with paramedicine called to report pts bp was 100/70 and weight down 7lbs x 1 week. Pt "feels good" and is asymptomatic. Per Janett Billow [place],FNP no changes. Dede aware.

## 2022-10-12 ENCOUNTER — Other Ambulatory Visit: Payer: Self-pay

## 2022-10-13 ENCOUNTER — Other Ambulatory Visit: Payer: Self-pay

## 2022-10-15 ENCOUNTER — Encounter (HOSPITAL_COMMUNITY): Payer: Self-pay | Admitting: Cardiology

## 2022-10-15 ENCOUNTER — Telehealth (HOSPITAL_COMMUNITY): Payer: Self-pay | Admitting: Emergency Medicine

## 2022-10-15 ENCOUNTER — Ambulatory Visit (HOSPITAL_COMMUNITY)
Admission: RE | Admit: 2022-10-15 | Discharge: 2022-10-15 | Disposition: A | Discharge status: Home / Self Care | Payer: Medicaid Other | Source: Ambulatory Visit | Attending: Cardiology | Admitting: Cardiology

## 2022-10-15 ENCOUNTER — Other Ambulatory Visit (HOSPITAL_COMMUNITY): Payer: Self-pay | Admitting: Emergency Medicine

## 2022-10-15 ENCOUNTER — Other Ambulatory Visit: Payer: Self-pay

## 2022-10-15 ENCOUNTER — Other Ambulatory Visit (HOSPITAL_COMMUNITY): Payer: Self-pay

## 2022-10-15 ENCOUNTER — Other Ambulatory Visit (HOSPITAL_COMMUNITY): Payer: Self-pay | Admitting: Cardiology

## 2022-10-15 VITALS — BP 98/60 | HR 88 | Wt 310.0 lb

## 2022-10-15 DIAGNOSIS — Z7984 Long term (current) use of oral hypoglycemic drugs: Secondary | ICD-10-CM | POA: Insufficient documentation

## 2022-10-15 DIAGNOSIS — E119 Type 2 diabetes mellitus without complications: Secondary | ICD-10-CM | POA: Diagnosis not present

## 2022-10-15 DIAGNOSIS — I472 Ventricular tachycardia, unspecified: Secondary | ICD-10-CM | POA: Insufficient documentation

## 2022-10-15 DIAGNOSIS — Z79899 Other long term (current) drug therapy: Secondary | ICD-10-CM | POA: Insufficient documentation

## 2022-10-15 DIAGNOSIS — Q245 Malformation of coronary vessels: Secondary | ICD-10-CM | POA: Diagnosis not present

## 2022-10-15 DIAGNOSIS — Z823 Family history of stroke: Secondary | ICD-10-CM | POA: Insufficient documentation

## 2022-10-15 DIAGNOSIS — I5032 Chronic diastolic (congestive) heart failure: Secondary | ICD-10-CM

## 2022-10-15 DIAGNOSIS — Z8249 Family history of ischemic heart disease and other diseases of the circulatory system: Secondary | ICD-10-CM | POA: Diagnosis not present

## 2022-10-15 DIAGNOSIS — I110 Hypertensive heart disease with heart failure: Secondary | ICD-10-CM | POA: Insufficient documentation

## 2022-10-15 DIAGNOSIS — Z6841 Body Mass Index (BMI) 40.0 and over, adult: Secondary | ICD-10-CM | POA: Diagnosis not present

## 2022-10-15 DIAGNOSIS — G4733 Obstructive sleep apnea (adult) (pediatric): Secondary | ICD-10-CM | POA: Diagnosis not present

## 2022-10-15 DIAGNOSIS — F319 Bipolar disorder, unspecified: Secondary | ICD-10-CM | POA: Diagnosis not present

## 2022-10-15 DIAGNOSIS — Z91199 Patient's noncompliance with other medical treatment and regimen due to unspecified reason: Secondary | ICD-10-CM | POA: Insufficient documentation

## 2022-10-15 DIAGNOSIS — I4819 Other persistent atrial fibrillation: Secondary | ICD-10-CM

## 2022-10-15 DIAGNOSIS — Z7901 Long term (current) use of anticoagulants: Secondary | ICD-10-CM | POA: Insufficient documentation

## 2022-10-15 LAB — BASIC METABOLIC PANEL
Anion gap: 7 (ref 5–15)
BUN: 22 mg/dL — ABNORMAL HIGH (ref 6–20)
CO2: 25 mmol/L (ref 22–32)
Calcium: 9 mg/dL (ref 8.9–10.3)
Chloride: 107 mmol/L (ref 98–111)
Creatinine, Ser: 1.14 mg/dL — ABNORMAL HIGH (ref 0.44–1.00)
GFR, Estimated: 57 mL/min — ABNORMAL LOW (ref >60)
Glucose, Bld: 95 mg/dL (ref 70–99)
Potassium: 4.3 mmol/L (ref 3.5–5.1)
Sodium: 139 mmol/L (ref 135–145)

## 2022-10-15 LAB — BRAIN NATRIURETIC PEPTIDE: B Natriuretic Peptide: 579 pg/mL — ABNORMAL HIGH (ref 0.0–100.0)

## 2022-10-15 MED ORDER — AMLODIPINE BESYLATE 5 MG PO TABS
5.0000 mg | ORAL_TABLET | Freq: Every day | ORAL | 3 refills | Status: DC
  Filled 2022-10-15: qty 90, 90d supply, fill #0
  Filled 2023-03-30: qty 90, 90d supply, fill #1
  Filled 2023-06-13: qty 90, 90d supply, fill #2

## 2022-10-15 NOTE — Patient Instructions (Signed)
DECREASE Amlodipine to '5mg'$  daily.  Labs done today, your results will be available in MyChart, we will contact you for abnormal readings.  Your provider has recommended that  you wear a Zio Patch for 14 days.  This monitor will record your heart rhythm for our review.  IF you have any symptoms while wearing the monitor please press the button.  If you have any issues with the patch or you notice a red or orange light on it please call the company at [phone].  Once you remove the patch please mail it back to the company as soon as possible so we can get the results.  You have been referred to the Electrophysiologist. They will call you to arrange your appointment.  Dr. Tarry Kos Joseph's office should call you to arrange your appointment.  Your physician recommends that you schedule a follow-up appointment in: 3 months   If you have any questions or concerns before your next appointment please send [place] a message through [place] or call our office at [phone].    TO LEAVE A MESSAGE FOR THE NURSE SELECT OPTION 2, PLEASE LEAVE A MESSAGE INCLUDING: YOUR NAME DATE OF BIRTH CALL BACK NUMBER REASON FOR CALL**this is important as we prioritize the call backs  YOU WILL RECEIVE A CALL BACK THE SAME DAY AS LONG AS YOU CALL BEFORE 4:00 PM  At the [facility], you and your health needs are our priority. As part of our continuing mission to provide you with exceptional heart care, we have created designated Provider Care Teams. These Care Teams include your primary Cardiologist (physician) and Advanced Practice Providers (APPs- Physician Assistants and Nurse Practitioners) who all work together to provide you with the care you need, when you need it.   You may see any of the following providers on your designated Care Team at your next follow up: Dr Glori Bickers Dr Loralie Champagne Dr. Roxana Hires, NP Lyda Jester, [place] [facility] [place],  [place] Forestine Na, NP Audry Riles, PharmD   Please be sure to bring in all your medications bottles to every appointment.

## 2022-10-15 NOTE — Telephone Encounter (Signed)
Called [name] to remind her of her clinic appointment today @ 2:20.  She advised she would be there.    Renee Ramus, [place] 10/15/2022

## 2022-10-15 NOTE — Progress Notes (Signed)
Advanced Heart Failure Clinic  Note  PCP: [name], NP HF Cardiologist: Dr. Aundra Dubin  HPI: Pt is a 57 y.o.female with a history of chronic diastolic heart failure, persistent atrial fibrillation on Xarelto, hypertension, obesity and OSA, not compliant w/ CPAP.  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  She had VFib with heart cath, treated w/ defibrillation to NSR. She also developed AKI after cath.  Echo 04/18/21 with EF 60-65%, no RWMA, severe LVH, RV normal with moderately elevated PA systolic pressure, Lt atrial size severely dilated and RA mildly dilated.  Mild MR, mod to severe TR.   Of note, brother had sudden cardiac death around age 37 and father and grandmother had CHF.    Admitted 6/76/72 for a/c diastolic CHF w/ fluid overload. Afib well rate controlled. Diuresed w/ IV Lasix, then transitioned to GDMT. Discharge home, weight 328 lbs.  Admitted 05/25/22 with acute on chronic HFpEF. Ran out her medications while traveling out of the country. Diuresed with IV lasix and transitioned to torsemide 40 mg bid. Echo showed LVEF 60-65%, severe LVH, septal thickenness over 20 mm but difficult to measure, RV normal.  Discharge weight 308 pounds.   cMRI 12/23 showed severe LAE with moderate RAE, severe basal septal hypertrophy 17 mm with LVEF normal 64%, no RWMAs, normal RV size and function RVEF 51%, abnormal delayed enhancement images with mottled LGE in apex and septum and more defined area of moderate LGE in mid inferior wall, normal parametric measures.  Unlikely cardiac amyloidosis, consider sarcoidosis or HCM.  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 She also had a RYR2 gene mutation.   She returns for HF followup today. She gets short of breath walking about 100  feet or with moderate exertion such as doing the laundary. BP is running low though she denies lightheadedness. She fatigues easily.  No orthopnea/PND.  No chest pain.   ECG (personally reviewed): Atrial fibrillation, poor RWP  Labs (7/23): Myeloma panel negative Labs (9/23): K 4.2, creatinine 1.36 Labs (10/23): K 4.2, creatinine 1.34, BNP 455 Labs (12/23): BNP 843, K 4, creatinine 1.2  PMH: 1. Atrial fibrillation: Chronic since 2019.  2. HTN 3. OSA: Unable to tolerate CPAP.  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   - Cardiolite (8/17): No ischemia 5. Chronic diastolic CHF: Echo (0/94): EF 60-65%, severe LVH, Septal thickenness over 33m but difficult to measure, RV normal.   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 EF >65%.  - Chest CT 2019 Prominent mediastinal lymph nodes, likely reactive. Lungs/Pleura: +Calcified granulomas. - Cardiac MRI (12/23): Severe LAE with moderate RAE, severe basal septal hypertrophy 17 mm with LVEF normal 64%, no RWMAs, normal RV size and function RVEF 51%, abnormal delayed enhancement images with mottled LGE in apex and septum and more defined area of moderate LGE in mid inferior wall, normal parametric measures.   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 She also had a RYR2 gene mutation.  6. Bipolar disorder 7. 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 But ran out of money and is no longer attending school. (Note patient has bipolar disorder).      Born in [place] but lived in [place] before coming back to [place].       Primary language is Arabic. Lives with mother and brother.   Social Determinants of Health   Financial Resource Strain: Low Risk  (05/26/2022)   Overall Financial Resource Strain (CARDIA)    Difficulty of Paying Living Expenses: Not very hard  Food Insecurity: No Food Insecurity (05/26/2022)   Hunger Vital Sign    Worried About Running Out of Food in the Last Year: Never true    Ran Out of Food in the Last Year: Never true  Transportation Needs: No Transportation Needs (05/26/2022)   PRAPARE - Hydrologist (Medical): No    Lack of Transportation (Non-Medical): No  Physical Activity: Not on file  Stress: No Stress Concern Present (07/15/2018)   [place]    Feeling of Stress : Only a little  Social Connections: Not on file  Intimate Partner Violence: Not on file   FH:  Brother with sudden cardiac death, father with CVA  Current Outpatient Medications  Medication Sig Dispense Refill   Accu-Chek Softclix Lancets lancets Use up to 4 times daily (Patient not taking: Reported on 10/15/2022) 100 each 0   Blood Glucose Monitoring Suppl (ACCU-CHEK GUIDE) w/Device KIT Use to check blood sugar three times daily. (Patient not taking: Reported on 10/15/2022) 1 kit 0   Blood Pressure Monitor DEVI Please provide patient with insurance approved blood pressure monitor I10.0 (Patient not taking: Reported on 10/15/2022) 1 each 0   carvedilol (COREG) 25 MG tablet Take 1 tablet (25 mg total) by mouth 2 (two) times daily with a meal. 60  tablet 3   empagliflozin (JARDIANCE) 10 MG TABS tablet Take 1 tablet (10 mg total) by mouth daily before breakfast. 30 tablet 6   glipiZIDE (GLUCOTROL XL) 10 MG 24 hr tablet Take 1 tablet (10 mg total) by mouth daily with breakfast. 90 tablet 0   glucose blood (ACCU-CHEK GUIDE) test strip Use as directed up to 4 times daily (Patient not taking: Reported on 10/15/2022) 100 each 0   hydrALAZINE (APRESOLINE) 25 MG tablet Take 1 tablet (25 mg total) by mouth 3 (three) times daily. 90 tablet 3   hydrOXYzine (VISTARIL) 25 MG capsule Take 1 capsule (25 mg total) by mouth every 8 (eight) hours as needed. 60 capsule 1   Incontinence Supply Disposable (INCONTINENCE BRIEF LARGE) MISC Please provide patient with insurance approved incontinence supplies/briefs 18 each 6   metFORMIN (GLUCOPHAGE) 1000 MG tablet Take 1 tablet (1,000 mg total) by mouth 2 (two) times daily with a meal. 60 tablet 3   Naphazoline-Pheniramine (EYE ALLERGY RELIEF OP) Place 1 drop  into both eyes daily as needed (allergy).     risperiDONE (RISPERDAL) 2 MG tablet Take 1 tablet (2 mg total) by mouth at bedtime. 30 tablet 3   rivaroxaban (XARELTO) 20 MG TABS tablet Take 1 tablet (20 mg total) by mouth daily with supper. 30 tablet 3   sacubitril-valsartan (ENTRESTO) 97-103 MG Take 1 tablet by mouth 2 (two) times daily. 60 tablet 10   spironolactone (ALDACTONE) 25 MG tablet Take 1 tablet (25 mg total) by mouth daily. 30 tablet 3   torsemide (DEMADEX) 20 MG tablet Take 3 tablets daily alternating with 2 tablets daily 360 tablet 0   amLODipine (NORVASC) 5 MG tablet Take 1 tablet (5 mg total) by mouth daily. 90 tablet 3   No current facility-administered medications for this encounter.   BP 98/60   Pulse 88   Wt (!) 140.6 kg (310 lb)   SpO2 97%   BMI 48.55 kg/m   Wt Readings from Last 3 Encounters:  10/15/22 (!) 140.6 kg (310 lb)  10/15/22 (!) 140.6 kg (310 lb)  10/09/22 (!) 140.8 kg (310 lb 6.4 oz)   PHYSICAL EXAM: General:  NAD Neck: No JVD, no thyromegaly or thyroid nodule.  Lungs: Clear to auscultation bilaterally with normal respiratory effort. CV: Nondisplaced PMI.  Heart regular S1/S2, no S3/S4, no murmur.  No peripheral edema.  No carotid bruit.  Normal pedal pulses.  Abdomen: Soft, nontender, no hepatosplenomegaly, no distention.  Skin: Intact without lesions or rashes.  Neurologic: Alert and oriented x 3.  Psych: Normal affect. Extremities: No clubbing or cyanosis.  HEENT: Normal.   ASSESSMENT & PLAN: 1. Chronic Diastolic CHF:  Echo (5/46) with EF 60-65%, severe LVH, Septal thickenness over 47m but difficult to measure, RV normal.   Severe LVH may be secondary to HTN, but also was concerned for possible hypertrophic cardiomyopathy.  Father/grandmother with CHF and brother with sudden death is concerning for familial cardiomyopathy.  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 cMRI w/ normal sized RV and function, RVEF 51%, severe basal septal hypertrophy 17 mm, LVEF normal 64% no RWMAs, abnormal LGE in con-coronary pattern with mottled uptake in apex and septum and more defined area of moderate uptake in mid inferior wall.  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  Cardiac MRI was not suggestive of cardiac amyloidosis.  She is not volume overloaded on exam, NYHA class II symptoms.  - Continue Entresto 97-103 mg bid  - Continue torsemide 60 mg daily alternating with 40 mg daily.  BMET/BNP today.  - Continue Jardiance 10 mg                                                                                                                                                                    -  Continue spironolactone 25 mg daily  -  Continue carvedilol 25 mg bid. - Refer to Dr. Lattie Corns for genetic counseling.   - She refused Cardiomems placement.  - I suspect she may have LV variant arrhythmogenic cardiomyopathy (versus a form of HCM).  She has significant LGE (scarring) in the LV, she has a family history of sudden death in 1st degree relatively, and she has a history VF at time of prior cath. I think she is high enough risk to warrant ICD.  I will refer her to EP for ICD consideration.  I will also arrange for 2 wk Zio monitor to assess for arrhythmias.   2. Hypertension: BP relatively low today.  - Decrease amlodipine to 5 mg daily.  3. Atrial fibrillation: Chronic since at least 2019.  Unlikely to cardiovert and stay in NSR at this point given chronicity and noncompliance with CPAP.  Rate controlled.  - Continue Coreg 25 mg bid  - Continue Xarelto.   4. OSA: Not compliant w/ CPAP.  - encouraged her to improve compliance   5. Morbid Obesity: Body mass index is 48.55 kg/m.  We referred her for semaglutide but she never answered the phone or returned the calls from pharmacy clinic.  6. DM II: Per PCP.  7. Anomalous RCA: Off the left cusp, appears to course interarterially between aorta and PA.  Cardiolite in 2017 showed no ischemia.  With an anomalous right, no attributable symptoms (no chest pain), and normal Cardiolite in past, would medically manage this.   F/u in 3 months with APP.   Loralie Champagne,  10/15/2022

## 2022-10-15 NOTE — Progress Notes (Signed)
Paramedicine Encounter   Patient ID: [name] , female,   DOB: 05-03-1966,56 y.o.,  MRN: [id number]   Met patient in clinic today with provider.  Time spent with patient 45 minutes  Pt met w/ Dr. Aundra Dubin who discussed PkP2 gene and plans to refer her to a genetics counselor.  Also discussed referring her to an EP doctor to discuss internal defibrillator.  He ordered her to wear a Zio Heart monitor x 2 weeks and it will be removed Feb 1st.   Med changes today decreased Amlodopine from '10mg'$  to '5mg'$   daily.  Med box reconciled to reflect changes.  I advised pt she has 3 prescriptions to pick up at the pharmacy and she is to place 1 Xarelto to p.m. regimen Sat thru Wed.  She advised she understands same.       Renee Ramus, [place] 10/15/2022

## 2022-10-16 ENCOUNTER — Other Ambulatory Visit (HOSPITAL_COMMUNITY): Payer: Self-pay

## 2022-10-16 ENCOUNTER — Telehealth (HOSPITAL_COMMUNITY): Payer: Self-pay | Admitting: Pharmacy Technician

## 2022-10-16 NOTE — Telephone Encounter (Signed)
Patient Advocate Encounter   Received notification from [facility] that prior authorization for Delene Loll is required.   PA submitted on CoverMyMeds Key [id number] Status is pending   Will continue to follow.

## 2022-10-19 NOTE — Telephone Encounter (Signed)
Advanced Heart Failure Patient Advocate Encounter  PA denied, EF too high. Sent denial information to Ander Purpura [facility]). She will work on appeal.

## 2022-10-20 ENCOUNTER — Other Ambulatory Visit: Payer: Self-pay

## 2022-10-20 ENCOUNTER — Telehealth (HOSPITAL_COMMUNITY): Payer: Self-pay | Admitting: Pharmacist

## 2022-10-20 NOTE — Telephone Encounter (Signed)
Advanced Heart Failure Patient Advocate Encounter  Prior Authorization for Delene Loll has been denied.  Appeal submitted to [facility] 10/20/22.   Audry Riles, PharmD, BCPS, BCCP, CPP Heart Failure Clinic Pharmacist [phone]

## 2022-10-22 ENCOUNTER — Inpatient Hospital Stay (HOSPITAL_COMMUNITY)
Admission: EM | Admit: 2022-10-22 | Discharge: 2022-10-28 | DRG: 177 | Disposition: A | Discharge status: Home / Self Care | Payer: Medicaid Other | Attending: Family Medicine | Admitting: Family Medicine

## 2022-10-22 ENCOUNTER — Other Ambulatory Visit: Payer: Self-pay

## 2022-10-22 ENCOUNTER — Emergency Department (HOSPITAL_COMMUNITY): Payer: Medicaid Other

## 2022-10-22 ENCOUNTER — Encounter (HOSPITAL_COMMUNITY): Payer: Self-pay

## 2022-10-22 ENCOUNTER — Other Ambulatory Visit (HOSPITAL_COMMUNITY): Payer: Self-pay | Admitting: Emergency Medicine

## 2022-10-22 DIAGNOSIS — F319 Bipolar disorder, unspecified: Secondary | ICD-10-CM | POA: Diagnosis present

## 2022-10-22 DIAGNOSIS — Z888 Allergy status to other drugs, medicaments and biological substances status: Secondary | ICD-10-CM

## 2022-10-22 DIAGNOSIS — Z79899 Other long term (current) drug therapy: Secondary | ICD-10-CM

## 2022-10-22 DIAGNOSIS — Z8249 Family history of ischemic heart disease and other diseases of the circulatory system: Secondary | ICD-10-CM

## 2022-10-22 DIAGNOSIS — N179 Acute kidney failure, unspecified: Secondary | ICD-10-CM | POA: Diagnosis present

## 2022-10-22 DIAGNOSIS — Z7984 Long term (current) use of oral hypoglycemic drugs: Secondary | ICD-10-CM

## 2022-10-22 DIAGNOSIS — I110 Hypertensive heart disease with heart failure: Secondary | ICD-10-CM | POA: Diagnosis present

## 2022-10-22 DIAGNOSIS — R519 Headache, unspecified: Secondary | ICD-10-CM | POA: Diagnosis present

## 2022-10-22 DIAGNOSIS — E119 Type 2 diabetes mellitus without complications: Secondary | ICD-10-CM | POA: Diagnosis present

## 2022-10-22 DIAGNOSIS — Z91014 Allergy to mammalian meats: Secondary | ICD-10-CM

## 2022-10-22 DIAGNOSIS — I252 Old myocardial infarction: Secondary | ICD-10-CM

## 2022-10-22 DIAGNOSIS — Z6841 Body Mass Index (BMI) 40.0 and over, adult: Secondary | ICD-10-CM

## 2022-10-22 DIAGNOSIS — R200 Anesthesia of skin: Principal | ICD-10-CM

## 2022-10-22 DIAGNOSIS — I4819 Other persistent atrial fibrillation: Secondary | ICD-10-CM | POA: Diagnosis present

## 2022-10-22 DIAGNOSIS — G4733 Obstructive sleep apnea (adult) (pediatric): Secondary | ICD-10-CM | POA: Diagnosis present

## 2022-10-22 DIAGNOSIS — U071 COVID-19: Principal | ICD-10-CM | POA: Diagnosis present

## 2022-10-22 DIAGNOSIS — Z9102 Food additives allergy status: Secondary | ICD-10-CM

## 2022-10-22 DIAGNOSIS — K922 Gastrointestinal hemorrhage, unspecified: Secondary | ICD-10-CM | POA: Diagnosis present

## 2022-10-22 DIAGNOSIS — Z8673 Personal history of transient ischemic attack (TIA), and cerebral infarction without residual deficits: Secondary | ICD-10-CM

## 2022-10-22 DIAGNOSIS — I5033 Acute on chronic diastolic (congestive) heart failure: Secondary | ICD-10-CM | POA: Diagnosis present

## 2022-10-22 DIAGNOSIS — Z7901 Long term (current) use of anticoagulants: Secondary | ICD-10-CM

## 2022-10-22 HISTORY — DX: Unspecified atrial fibrillation: I48.91

## 2022-10-22 LAB — RESP PANEL BY RT-PCR (RSV, FLU A&B, COVID)  RVPGX2
Influenza A by PCR: NEGATIVE
Influenza B by PCR: NEGATIVE
Resp Syncytial Virus by PCR: NEGATIVE
SARS Coronavirus 2 by RT PCR: POSITIVE — AB

## 2022-10-22 LAB — DIFFERENTIAL
Abs Immature Granulocytes: 0.03 10*3/uL (ref 0.00–0.07)
Basophils Absolute: 0 10*3/uL (ref 0.0–0.1)
Basophils Relative: 1 %
Eosinophils Absolute: 0.1 10*3/uL (ref 0.0–0.5)
Eosinophils Relative: 2 %
Immature Granulocytes: 1 %
Lymphocytes Relative: 21 %
Lymphs Abs: 1.1 10*3/uL (ref 0.7–4.0)
Monocytes Absolute: 0.5 10*3/uL (ref 0.1–1.0)
Monocytes Relative: 10 %
Neutro Abs: 3.5 10*3/uL (ref 1.7–7.7)
Neutrophils Relative %: 65 %

## 2022-10-22 LAB — COMPREHENSIVE METABOLIC PANEL
ALT: 14 U/L (ref 0–44)
AST: 24 U/L (ref 15–41)
Albumin: 3.7 g/dL (ref 3.5–5.0)
Alkaline Phosphatase: 88 U/L (ref 38–126)
Anion gap: 11 (ref 5–15)
BUN: 42 mg/dL — ABNORMAL HIGH (ref 6–20)
CO2: 26 mmol/L (ref 22–32)
Calcium: 9.1 mg/dL (ref 8.9–10.3)
Chloride: 101 mmol/L (ref 98–111)
Creatinine, Ser: 1.9 mg/dL — ABNORMAL HIGH (ref 0.44–1.00)
GFR, Estimated: 31 mL/min — ABNORMAL LOW (ref >60)
Glucose, Bld: 205 mg/dL — ABNORMAL HIGH (ref 70–99)
Potassium: 4.4 mmol/L (ref 3.5–5.1)
Sodium: 138 mmol/L (ref 135–145)
Total Bilirubin: 0.6 mg/dL (ref 0.3–1.2)
Total Protein: 7.6 g/dL (ref 6.5–8.1)

## 2022-10-22 LAB — CBC
HCT: 41.4 % (ref 36.0–46.0)
Hemoglobin: 13.5 g/dL (ref 12.0–15.0)
MCH: 29.2 pg (ref 26.0–34.0)
MCHC: 32.6 g/dL (ref 30.0–36.0)
MCV: 89.4 fL (ref 80.0–100.0)
Platelets: 252 10*3/uL (ref 150–400)
RBC: 4.63 MIL/uL (ref 3.87–5.11)
RDW: 14 % (ref 11.5–15.5)
WBC: 5.3 10*3/uL (ref 4.0–10.5)
nRBC: 0 % (ref 0.0–0.2)

## 2022-10-22 LAB — APTT: aPTT: 35 seconds (ref 24–36)

## 2022-10-22 LAB — PROTIME-INR
INR: 1.6 — ABNORMAL HIGH (ref 0.8–1.2)
Prothrombin Time: 18.7 seconds — ABNORMAL HIGH (ref 11.4–15.2)

## 2022-10-22 LAB — ETHANOL: Alcohol, Ethyl (B): <10 mg/dL (ref <10)

## 2022-10-22 MED ORDER — DIPHENHYDRAMINE HCL 50 MG/ML IJ SOLN
12.5000 mg | Freq: Once | INTRAMUSCULAR | Status: AC
  Administered 2022-10-22: 12.5 mg via INTRAVENOUS
  Filled 2022-10-22: qty 1

## 2022-10-22 MED ORDER — SODIUM CHLORIDE 0.9% FLUSH: 3.0000 mL | Freq: Once | INTRAVENOUS | Status: DC

## 2022-10-22 MED ORDER — LACTATED RINGERS IV BOLUS
1000.0000 mL | Freq: Once | INTRAVENOUS | Status: AC
  Administered 2022-10-22: 1000 mL via INTRAVENOUS

## 2022-10-22 MED ORDER — LORAZEPAM 1 MG PO TABS
1.0000 mg | ORAL_TABLET | Freq: Once | ORAL | Status: AC
  Administered 2022-10-22: 1 mg via ORAL
  Filled 2022-10-22: qty 1

## 2022-10-22 MED ORDER — PROCHLORPERAZINE EDISYLATE 10 MG/2ML IJ SOLN
10.0000 mg | Freq: Once | INTRAMUSCULAR | Status: AC
  Administered 2022-10-22: 10 mg via INTRAVENOUS
  Filled 2022-10-22: qty 2

## 2022-10-22 MED ORDER — IOHEXOL 350 MG/ML SOLN
75.0000 mL | Freq: Once | INTRAVENOUS | Status: AC | PRN
  Administered 2022-10-22: 75 mL via INTRAVENOUS

## 2022-10-22 NOTE — ED Provider Notes (Signed)
[facility] Provider Note   CSN: [id number] Arrival date & time: 10/22/22  1635     History  Chief Complaint  Patient presents with   Stroke Symptoms    [name] is a 57 y.o. female with bipolar disorder, diastolic heart failure, HTN, history of NSTEMI, obesity, T2DM, pulmonary hypertension, SVT, persistent A-fib, GI bleeding, hypokalemia, obesity hypoventilation syndrome, ILD, who presents with stroke-like symptoms.   Patient reports at 9:00 this morning she had an acute onset left-sided headache rated 10 out of 10 that she has never had before.  No associated visual changes but did have associated left upper extremity and left lower extremity numbness tingling/pins-and-needles feeling, states it feels like there is cold water being poured over her extremities, and that it is a very "weird feeling."  No history of similar.  Denies facial droop, trouble speaking, trouble swallowing, asymmetric weakness.  Does endorse feeling very generally fatigued.  Also endorses having nausea as well as nonbloody diarrhea and states she has had a cough recently but has not felt any CP or SOB. States she has had an MI before but never a stroke.  Denies nuchal rigidity or seizure activity.  HPI     Home Medications Prior to Admission medications   Medication Sig Start Date End Date Taking? Authorizing Provider  Accu-Chek Softclix Lancets lancets Use up to 4 times daily Patient not taking: Reported on 10/15/2022 05/28/22   Domenic Polite, MD  amLODipine (NORVASC) 5 MG tablet Take 1 tablet (5 mg total) by mouth daily. 10/15/22   Larey Dresser, MD  Blood Glucose Monitoring Suppl (ACCU-CHEK GUIDE) w/Device KIT Use to check blood sugar three times daily. Patient not taking: Reported on 10/15/2022 05/28/22   Domenic Polite, MD  Blood Pressure Monitor DEVI Please provide patient with insurance approved blood pressure monitor I10.0 Patient not taking: Reported  on 10/15/2022 07/21/20   [name], NP  carvedilol (COREG) 25 MG tablet Take 1 tablet (25 mg total) by mouth 2 (two) times daily with a meal. 06/24/22   Clegg, Amy D, NP  empagliflozin (JARDIANCE) 10 MG TABS tablet Take 1 tablet (10 mg total) by mouth daily before breakfast. 07/08/22   [place]ford, Maricela Bo, FNP  glipiZIDE (GLUCOTROL XL) 10 MG 24 hr tablet Take 1 tablet (10 mg total) by mouth daily with breakfast. 09/24/22   Charlott Rakes, MD  glucose blood (ACCU-CHEK GUIDE) test strip Use as directed up to 4 times daily Patient not taking: Reported on 10/15/2022 05/28/22   Domenic Polite, MD  hydrALAZINE (APRESOLINE) 25 MG tablet Take 1 tablet (25 mg total) by mouth 3 (three) times daily. 09/03/22 01/01/23  Consuelo Pandy, PA-C  hydrOXYzine (VISTARIL) 25 MG capsule Take 1 capsule (25 mg total) by mouth every 8 (eight) hours as needed. 10/09/22   [name], NP  Incontinence Supply Disposable (INCONTINENCE BRIEF LARGE) MISC Please provide patient with insurance approved incontinence supplies/briefs 07/02/20   [name], NP  metFORMIN (GLUCOPHAGE) 1000 MG tablet Take 1 tablet (1,000 mg total) by mouth 2 (two) times daily with a meal. 09/24/22   McClung, Dionne Bucy, PA-C  Naphazoline-Pheniramine (EYE ALLERGY RELIEF OP) Place 1 drop into both eyes daily as needed (allergy). Patient not taking: Reported on 10/22/2022    [provider]  risperiDONE (RISPERDAL) 2 MG tablet Take 1 tablet (2 mg total) by mouth at bedtime. 10/09/22   Charlott Rakes, MD  rivaroxaban (XARELTO) 20 MG TABS tablet  Take 1 tablet (20 mg total) by mouth daily with supper. 09/24/22 12/23/22  Argentina Donovan, PA-C  sacubitril-valsartan (ENTRESTO) 97-103 MG Take 1 tablet by mouth 2 (two) times daily. 09/03/22   Consuelo Pandy, PA-C  spironolactone (ALDACTONE) 25 MG tablet Take 1 tablet (25 mg total) by mouth daily. 09/24/22   Argentina Donovan, PA-C  torsemide (DEMADEX) 20 MG tablet Take 3 tablets daily  alternating with 2 tablets daily 08/06/22   Larey Dresser, MD      Allergies    Acetaminophen, Caffeine, Farxiga [dapagliflozin], and Lisinopril    Review of Systems   Review of Systems Review of systems Negative for f/c.  A 10 point review of systems was performed and is negative unless otherwise reported in HPI.  Physical Exam Updated Vital Signs BP (!) 131/91   Pulse 87   Temp 97.7 F (36.5 C) (Oral)   Resp 20   Ht '5\' 7"'$  (1.702 m)   Wt (!) 140.6 kg   SpO2 99%   BMI 48.55 kg/m  Physical Exam General: Normal appearing female, lying in bed.  HEENT: PERRLA, EOMI, Sclera anicteric, MMM, trachea midline.  Cardiology: RRR, no murmurs/rubs/gallops. BL radial and DP pulses equal bilaterally.  Resp: Normal respiratory rate and effort. CTAB, no wheezes, rhonchi, crackles.  Abd: Soft, non-tender, non-distended. No rebound tenderness or guarding.  GU: Deferred. MSK: No peripheral edema or signs of trauma. Extremities without deformity or TTP. No cyanosis or clubbing. Skin: warm, dry. No rashes or lesions. Back: No CVA tenderness Neuro: A&Ox4, CNs II-XII grossly intact. 5/5 strength in all extrmeities. Sensation decreased to light touch throughout LUE/LLE. No facial droop, tongue protrudes midline. Speech with accent but no dysarthria or aphasia noted. Normal gait. Psych: Normal mood and affect.   1a  Level of consciousness: 0=alert; keenly responsive  1b. LOC questions:  0=Performs both tasks correctly  1c. LOC commands: 0=Performs both tasks correctly  2.  Best Gaze: 0=normal  3.  Visual: 0=No visual loss  4. Facial Palsy: 0=Normal symmetric movement  5a.  Motor left arm: 0=No drift, limb holds 90 (or 45) degrees for full 10 seconds  5b.  Motor right arm: 0=No drift, limb holds 90 (or 45) degrees for full 10 seconds  6a. motor left leg: 0=No drift, limb holds 90 (or 45) degrees for full 10 seconds  6b  Motor right leg:  0=No drift, limb holds 90 (or 45) degrees for full 10  seconds  7. Limb Ataxia: 0=Absent  8.  Sensory: 1=Mild to moderate sensory loss; patient feels pinprick is less sharp or is dull on the affected side; there is a loss of superficial pain with pinprick but patient is aware She is being touched  9. Best Language:  0=No aphasia, normal  10. Dysarthria: 0=Normal  11. Extinction and Inattention: 0=No abnormality   Total:   1        ED Results / Procedures / Treatments   Labs (all labs ordered are listed, but only abnormal results are displayed) Labs Reviewed  RESP PANEL BY RT-PCR (RSV, FLU A&B, COVID)  RVPGX2 - Abnormal; Notable for the following components:      Result Value   SARS Coronavirus 2 by RT PCR POSITIVE (*)    All other components within normal limits  PROTIME-INR - Abnormal; Notable for the following components:   Prothrombin Time 18.7 (*)    INR 1.6 (*)    All other components within normal limits  COMPREHENSIVE METABOLIC PANEL -  Abnormal; Notable for the following components:   Glucose, Bld 205 (*)    BUN 42 (*)    Creatinine, Ser 1.90 (*)    GFR, Estimated 31 (*)    All other components within normal limits  APTT  CBC  DIFFERENTIAL  ETHANOL  I-STAT CHEM 8, ED  I-STAT BETA HCG BLOOD, ED ([facility], [facility], [facility] ONLY)  CBG MONITORING, ED    EKG EKG Interpretation  Date/Time:  Thursday October 22 2022 17:05:22 EST Ventricular Rate:  86 PR Interval:    QRS Duration: 80 QT Interval:  368 QTC Calculation: 440 R Axis:   83 Text Interpretation: Atrial fibrillation  Similar to prior EKGS Confirmed by Cindee Lame [phone]) on 10/22/2022 7:05:59 PM  Radiology CT ANGIO HEAD NECK W WO CM  Result Date: 10/22/2022 CLINICAL DATA:  Neuro deficit, acute, stroke suspected headache and left arm numbness EXAM: CT ANGIOGRAPHY HEAD AND NECK TECHNIQUE: Multidetector CT imaging of the head and neck was performed using the standard protocol during bolus administration of intravenous contrast. Multiplanar CT image reconstructions and MIPs  were obtained to evaluate the vascular anatomy. Carotid stenosis measurements (when applicable) are obtained utilizing NASCET criteria, using the distal internal carotid diameter as the denominator. RADIATION DOSE REDUCTION: This exam was performed according to the departmental dose-optimization program which includes automated exposure control, adjustment of the mA and/or kV according to patient size and/or use of iterative reconstruction technique. CONTRAST:  70m OMNIPAQUE IOHEXOL 350 MG/ML SOLN COMPARISON:  Same day CT head. FINDINGS: CTA NECK FINDINGS Aortic arch: Great vessel origins are patent without significant stenosis. Right carotid system: Tortuous. No evidence of dissection, stenosis (50% or greater), or occlusion. Left carotid system: Tortuous. No evidence of dissection, stenosis (50% or greater), or occlusion. Vertebral arteries: Codominant. No evidence of dissection, stenosis (50% or greater), or occlusion. Skeleton: No acute findings. Other neck: No acute findings. Upper chest: Approximately 4 mm pulmonary nodule in the right upper lobe. Review of the MIP images confirms the above findings CTA HEAD FINDINGS Anterior circulation: Bilateral intracranial ICAs, MCAs, and ACAs are patent without proximal hemodynamically significant stenosis. Posterior circulation: Bilateral intradural vertebral arteries, basilar artery and bilateral posterior cerebral arteries are patent without proximal hemodynamically significant stenosis. Venous sinuses: As permitted by contrast timing, patent. Review of the MIP images confirms the above findings IMPRESSION: 1. No large vessel occlusion or proximal hemodynamically significant stenosis. 2. Approximately 4 mm pulmonary nodule in the right upper lobe. No follow-up needed if patient is low-risk.This recommendation follows the consensus statement: Guidelines for Management of Incidental Pulmonary Nodules Detected on CT Images: From the Fleischner Society 2017; Radiology  2017; 284:228-243. Electronically Signed   By: FMargaretha SheffieldM.D.   On: 10/22/2022 19:32   CT HEAD WO CONTRAST  Result Date: 10/22/2022 CLINICAL DATA:  Neuro deficit, acute, stroke suspected EXAM: CT HEAD WITHOUT CONTRAST TECHNIQUE: Contiguous axial images were obtained from the base of the skull through the vertex without intravenous contrast. RADIATION DOSE REDUCTION: This exam was performed according to the departmental dose-optimization program which includes automated exposure control, adjustment of the mA and/or kV according to patient size and/or use of iterative reconstruction technique. COMPARISON:  CT head 12/07/2013 FINDINGS: Brain: Interval development of age-indeterminate, likely chronic, right parietooccipital loss of gray-white matter differentiation (7:57). Interval development of an age-indeterminate, possibly acute/subacute, left occipital infarction (7:62). No evidence of large-territorial acute infarction. No parenchymal hemorrhage. No mass lesion. No extra-axial collection. No mass effect or midline shift. No hydrocephalus. Basilar cisterns are  patent. Vascular: No hyperdense vessel. Skull: No acute fracture or focal lesion. Sinuses/Orbits: Partial opacification of bilateral sphenoid, right frontal, right maxillary sinuses. Mild mucosal thickening in left maxillary sinus. Otherwise remaining paranasal sinuses and mastoid air cells are clear. The orbits are unremarkable. Other: None. IMPRESSION: 1. Interval development of an age-indeterminate, possibly acute/subacute, left occipital infarction. Interval development of an age-indeterminate, likely chronic, right parietooccipital loss of gray-white matter differentiation. Consider MRI without contrast for further evaluation. 2. No acute intracranial hemorrhage. These results were called by telephone at the time of interpretation on 10/22/2022 at 7:05 pm to provider Dr. Orpah Melter, who verbally acknowledged these results. Electronically Signed    By: Iven Finn M.D.   On: 10/22/2022 19:06    Procedures Procedures    Medications Ordered in ED Medications  sodium chloride flush (NS) 0.9 % injection 3 mL (3 mLs Intravenous Not Given 10/22/22 2040)  iohexol (OMNIPAQUE) 350 MG/ML injection 75 mL (75 mLs Intravenous Contrast Given 10/22/22 1917)  lactated ringers bolus 1,000 mL (1,000 mLs Intravenous New Bag/Given 10/22/22 2033)  prochlorperazine (COMPAZINE) injection 10 mg (10 mg Intravenous Given 10/22/22 2034)  diphenhydrAMINE (BENADRYL) injection 12.5 mg (12.5 mg Intravenous Given 10/22/22 2034)  LORazepam (ATIVAN) tablet 1 mg (1 mg Oral Given 10/22/22 0100)    ED Course/ Medical Decision Making/ A&P                          Medical Decision Making Amount and/or Complexity of Data Reviewed Labs: ordered. Decision-making details documented in ED Course. Radiology: ordered. Decision-making details documented in ED Course.  Risk Prescription drug management.    This patient presents to the ED for concern of headache and stroke-like symptoms, this involves an extensive number of treatment options, and is a complaint that carries with it a high risk of complications and morbidity.  I considered the following differential and admission for this acute, potentially life threatening condition.   MDM:    In a patient with acute onset headache with associated neurodeficits to include numbness tingling in her left upper and left lower extremity, greatest concern for ICH such as subarachnoid hemorrhage or intraparenchymal hemorrhage, as well as acute ischemic stroke.  Patient's NIHSS is 1 and she take Xarelto, she is also not within the window for TNK given that her symptoms and last are normal were at 9 AM this morning. Also consider complex migraine given concurrent HA and neuro symptoms; electrolyte derangements given report of nausea diarrhea, hypo-/hyperglycemia, Todd's paralysis though no seizure activity reported, or other  intracranial abnormality such as brain tumor.  She has no chest pain or abdominal pain to indicate aortic syndrome. Pt also reports some cough recently, has no SOB or f/c, but will obtain viral panel.  CT head ordered from triage demonstrates a possible left occipital subacute/acute infarction.  Will order CTA head and neck, headache cocktail and consult with neurology.  Clinical Course as of 10/23/22 0012  Thu Oct 22, 2022  1904 D/w radiology who states L occipital hypodensity could be acute/subacute infarction. Radiology discussed with neuro.  [HN]  1927 Pt with improving LUE/LLE numbness/cold feeling but still present. 10/10 left sided headache.  Ordered a CTA H&N and headache cocktail. Paging neurology. [HN]  1927 INR(!): 1.6 Takes xarelto [HN]  1927 CBC No leukocytosis, anemia [HN]  1945 CT ANGIO HEAD NECK W WO CM 1. No large vessel occlusion or proximal hemodynamically significant stenosis. 2. Approximately 4 mm pulmonary nodule in the  right upper lobe. No follow-up needed if patient is low-risk.This recommendation follows the consensus statement: Guidelines for Management of Incidental Pulmonary Nodules Detected on CT Images: From the Fleischner Society [HN]  1945 No LVO on CTA Patient's symtpoms do not localize to area of concern on CTH. D/w neurology who recommends MRI. If neg for acute stroke, can be dc'd [HN]  1947 Creatinine(!): 1.90 Seems to be chronically increasing, baseline seems to be 1.1-1.7 [HN]  2129 Given '1mg'$  PO ativan for claustrophobia in MRI. [HN]  Fri Oct 23, 2022  0010 SARS Coronavirus 2 by RT PCR(!): POSITIVE [HN]  0010 Patient is covid positive. This could explain her headache symptoms. [HN]  0011 Patient is signed out to the oncoming ED physician Dr. Leonette Monarch who is made aware of her history, presentation, exam, workup, and plan.  Plan is to obtain MRI and reevaluate. If MRI is negative and headache has improved, patient can be discharged home with o/p f/u.  [HN]     Clinical Course User Index [HN] Audley Hose, MD    Labs: I Ordered, and personally interpreted labs.  The pertinent results include: Those listed above  Imaging Studies ordered: I ordered imaging studies including CT head, CTA head and neck I independently visualized and interpreted imaging. I agree with the radiologist interpretation  Additional history obtained from chart review.    Cardiac Monitoring: The patient was maintained on a cardiac monitor.  I personally viewed and interpreted the cardiac monitored which showed an underlying rhythm of: Afib  Reevaluation: After the interventions noted above, I reevaluated the patient and found that they have :improved  Social Determinants of Health: Patient lives independently   Disposition:  TBD  Co morbidities that complicate the patient evaluation  Past Medical History:  Diagnosis Date   Acute on chronic diastolic congestive heart failure ([place]) 11/02/2013   10/03/2015, 11/13/2015, 08/03/2017   Benign essential HTN 11/28/2013   Bipolar disease, chronic ([facility])    Chest pain    a. 2012 Myoview: EF 63%, no isch/infarct;  b. 04/2016 Lexiscan MV: EF 73%, no ischemia/infarct-->Low risk.   Chronic diastolic CHF (congestive heart failure) ([place]) 07/23/2011   a. 2015 Echo: EF 55-60%, Gr2 DD;  b. 09/2015 Echo: EF 60-65%, no rwma, mod dil LA, PASP 39mHg.   Cor pulmonale (chronic) ([facility])    History of thyrotoxicosis    HTN (hypertension) 11/28/2013   Hypertensive heart disease 10/18/2013   Hypoglycemia    Insulin dependent type 2 diabetes mellitus, uncontrolled    Mediastinal adenopathy    Morbid obesity due to excess calories ([facility] 02/19/2011   Morbid obesity with BMI of 50.0-59.9, adult ([facility])    OSA (obstructive sleep apnea) 03/06/2011   [place] 12/09/2017   Pulmonary HTN, moderate to severe 11/03/2013   Sinusitis, chronic 01/02/2015   SVT (supraventricular tachycardia) 12/06/2013   Uncontrolled type 2  diabetes mellitus with hyperglycemia (HCC)      Medicines Meds ordered this encounter  Medications   sodium chloride flush (NS) 0.9 % injection 3 mL   iohexol (OMNIPAQUE) 350 MG/ML injection 75 mL   lactated ringers bolus 1,000 mL   prochlorperazine (COMPAZINE) injection 10 mg   diphenhydrAMINE (BENADRYL) injection 12.5 mg   LORazepam (ATIVAN) tablet 1 mg    I have reviewed the patients home medicines and have made adjustments as needed  Problem List / ED Course: Problem List Items Addressed This Visit       Genitourinary   AKI (acute kidney injury) (  [place])   Other Visit Diagnoses     Left sided numbness    -  Primary   Acute nonintractable headache, unspecified headache type                       This note was created using dictation software, which may contain spelling or grammatical errors.    Audley Hose, MD 10/23/22 [phone]

## 2022-10-22 NOTE — ED Triage Notes (Signed)
Pt arrived via [facility] for c/o HA started at 0900 today. Pt c/o pins and needles on left side. Per EMS, stroke screen neg. LKW 0900 today.

## 2022-10-22 NOTE — ED Notes (Signed)
Patient transported to MRI 

## 2022-10-22 NOTE — ED Notes (Signed)
Patient back from MRI.

## 2022-10-22 NOTE — Progress Notes (Signed)
Upon beginning home visit, Ms. Rajagopal states she has a headache 10/10 on pain scale.  She state, "I've never felt this way before."  She indicates that the headache is to the left side of her head.  She says the whole left side of her body "feels like pins and needles."  She had no slurred speech, facial droop.  Negative stroke screen.  She states she woke up this morning at 9:00 with the headache and has not taken any OTC's for same.  Based on her complaint I recommend she seek evaluation at the hospital to rule out CVA.  EMS called for transport.  I assisted packaging her on the stretcher and into the unit.   I stayed and completed her medication reconciliation x 1 week.  She needs samples of Entresto which I will pick up from the clinic and return to her.    Renee Ramus, [place] 10/25/2022

## 2022-10-22 NOTE — ED Provider Triage Note (Signed)
Emergency Medicine Provider Triage Evaluation Note  [name] , a 57 y.o. female  was evaluated in triage.  Pt complains of headache and left arm numbness that started today around 9 AM.  Denies vision changes, chest pain, shortness of breath.  Review of Systems  Positive: As above Negative: As above  Physical Exam  BP 121/88 (BP Location: Right Arm)   Pulse 93   Temp 97.6 F (36.4 C) (Oral)   Resp 18   Ht '5\' 7"'$  (1.702 m)   Wt (!) 140.6 kg   SpO2 96%   BMI 48.55 kg/m  Gen:   Awake, no distress   Resp:  Normal effort  MSK:   Moves extremities without difficulty  Other:  Neuroexam unremarkable.  Medical Decision Making  Medically screening exam initiated at 4:51 PM.  Appropriate orders placed.  GYIRS Novitski was informed that the remainder of the evaluation will be completed by another provider, this initial triage assessment does not replace that evaluation, and the importance of remaining in the ED until their evaluation is complete.    Rex Kras, [place] 10/22/22 [phone]

## 2022-10-23 ENCOUNTER — Inpatient Hospital Stay (HOSPITAL_COMMUNITY): Payer: Medicaid Other

## 2022-10-23 ENCOUNTER — Emergency Department (HOSPITAL_COMMUNITY): Payer: Medicaid Other

## 2022-10-23 ENCOUNTER — Other Ambulatory Visit (HOSPITAL_COMMUNITY): Payer: Self-pay | Admitting: Emergency Medicine

## 2022-10-23 DIAGNOSIS — G039 Meningitis, unspecified: Secondary | ICD-10-CM | POA: Diagnosis not present

## 2022-10-23 DIAGNOSIS — R200 Anesthesia of skin: Secondary | ICD-10-CM | POA: Diagnosis not present

## 2022-10-23 DIAGNOSIS — F319 Bipolar disorder, unspecified: Secondary | ICD-10-CM | POA: Diagnosis present

## 2022-10-23 DIAGNOSIS — K922 Gastrointestinal hemorrhage, unspecified: Secondary | ICD-10-CM | POA: Diagnosis present

## 2022-10-23 DIAGNOSIS — I110 Hypertensive heart disease with heart failure: Secondary | ICD-10-CM | POA: Diagnosis present

## 2022-10-23 DIAGNOSIS — Z6841 Body Mass Index (BMI) 40.0 and over, adult: Secondary | ICD-10-CM | POA: Diagnosis not present

## 2022-10-23 DIAGNOSIS — R519 Headache, unspecified: Secondary | ICD-10-CM | POA: Diagnosis not present

## 2022-10-23 DIAGNOSIS — G4733 Obstructive sleep apnea (adult) (pediatric): Secondary | ICD-10-CM | POA: Diagnosis present

## 2022-10-23 DIAGNOSIS — Z91014 Allergy to mammalian meats: Secondary | ICD-10-CM | POA: Diagnosis not present

## 2022-10-23 DIAGNOSIS — Z7984 Long term (current) use of oral hypoglycemic drugs: Secondary | ICD-10-CM | POA: Diagnosis not present

## 2022-10-23 DIAGNOSIS — E119 Type 2 diabetes mellitus without complications: Secondary | ICD-10-CM | POA: Diagnosis present

## 2022-10-23 DIAGNOSIS — Z9102 Food additives allergy status: Secondary | ICD-10-CM | POA: Diagnosis not present

## 2022-10-23 DIAGNOSIS — U071 COVID-19: Secondary | ICD-10-CM | POA: Diagnosis present

## 2022-10-23 DIAGNOSIS — Z8673 Personal history of transient ischemic attack (TIA), and cerebral infarction without residual deficits: Secondary | ICD-10-CM | POA: Diagnosis not present

## 2022-10-23 DIAGNOSIS — I4819 Other persistent atrial fibrillation: Secondary | ICD-10-CM | POA: Diagnosis present

## 2022-10-23 DIAGNOSIS — Z8249 Family history of ischemic heart disease and other diseases of the circulatory system: Secondary | ICD-10-CM | POA: Diagnosis not present

## 2022-10-23 DIAGNOSIS — I5033 Acute on chronic diastolic (congestive) heart failure: Secondary | ICD-10-CM | POA: Diagnosis present

## 2022-10-23 DIAGNOSIS — Z79899 Other long term (current) drug therapy: Secondary | ICD-10-CM | POA: Diagnosis not present

## 2022-10-23 DIAGNOSIS — I252 Old myocardial infarction: Secondary | ICD-10-CM | POA: Diagnosis not present

## 2022-10-23 DIAGNOSIS — Z7901 Long term (current) use of anticoagulants: Secondary | ICD-10-CM | POA: Diagnosis not present

## 2022-10-23 DIAGNOSIS — N179 Acute kidney failure, unspecified: Secondary | ICD-10-CM

## 2022-10-23 DIAGNOSIS — Z888 Allergy status to other drugs, medicaments and biological substances status: Secondary | ICD-10-CM | POA: Diagnosis not present

## 2022-10-23 LAB — GLUCOSE, CAPILLARY
Glucose-Capillary: 101 mg/dL — ABNORMAL HIGH (ref 70–99)
Glucose-Capillary: 82 mg/dL (ref 70–99)

## 2022-10-23 LAB — APTT: aPTT: 48 seconds — ABNORMAL HIGH (ref 24–36)

## 2022-10-23 LAB — HEPARIN LEVEL (UNFRACTIONATED): Heparin Unfractionated: 0.41 IU/mL (ref 0.30–0.70)

## 2022-10-23 LAB — CBG MONITORING, ED
Glucose-Capillary: 153 mg/dL — ABNORMAL HIGH (ref 70–99)
Glucose-Capillary: 77 mg/dL (ref 70–99)

## 2022-10-23 MED ORDER — INSULIN ASPART 100 UNIT/ML IJ SOLN
0.0000 [IU] | Freq: Every day | INTRAMUSCULAR | Status: DC
  Administered 2022-10-27: 2 [IU] via SUBCUTANEOUS

## 2022-10-23 MED ORDER — SODIUM CHLORIDE 0.9 % IV SOLN
2.0000 g | Freq: Once | INTRAVENOUS | Status: AC
  Administered 2022-10-23: 2 g via INTRAVENOUS
  Filled 2022-10-23: qty 2000

## 2022-10-23 MED ORDER — DIAZEPAM 5 MG/ML IJ SOLN
5.0000 mg | Freq: Once | INTRAMUSCULAR | Status: AC
  Administered 2022-10-23: 5 mg via INTRAVENOUS
  Filled 2022-10-23: qty 2

## 2022-10-23 MED ORDER — SODIUM CHLORIDE 0.9 % IV SOLN
2.0000 g | Freq: Four times a day (QID) | INTRAVENOUS | Status: DC
  Administered 2022-10-23 – 2022-10-27 (×17): 2 g via INTRAVENOUS
  Filled 2022-10-23 (×19): qty 2000

## 2022-10-23 MED ORDER — SODIUM CHLORIDE 0.9 % IV SOLN
200.0000 mg | Freq: Once | INTRAVENOUS | Status: AC
  Administered 2022-10-23: 200 mg via INTRAVENOUS
  Filled 2022-10-23: qty 40

## 2022-10-23 MED ORDER — INSULIN ASPART 100 UNIT/ML IJ SOLN
0.0000 [IU] | Freq: Three times a day (TID) | INTRAMUSCULAR | Status: DC
  Administered 2022-10-24: 3 [IU] via SUBCUTANEOUS
  Administered 2022-10-25 – 2022-10-27 (×6): 2 [IU] via SUBCUTANEOUS
  Administered 2022-10-27: 3 [IU] via SUBCUTANEOUS
  Administered 2022-10-27: 2 [IU] via SUBCUTANEOUS
  Administered 2022-10-28: 3 [IU] via SUBCUTANEOUS

## 2022-10-23 MED ORDER — VANCOMYCIN HCL IN DEXTROSE 1-5 GM/200ML-% IV SOLN
1000.0000 mg | Freq: Once | INTRAVENOUS | Status: AC
  Administered 2022-10-23: 1000 mg via INTRAVENOUS
  Filled 2022-10-23: qty 200

## 2022-10-23 MED ORDER — GUAIFENESIN-DM 100-10 MG/5ML PO SYRP: 5.0000 mL | ORAL_SOLUTION | ORAL | Status: DC | PRN

## 2022-10-23 MED ORDER — SODIUM CHLORIDE 0.9 % IV SOLN
100.0000 mg | Freq: Every day | INTRAVENOUS | Status: AC
  Administered 2022-10-24 – 2022-10-25 (×2): 100 mg via INTRAVENOUS
  Filled 2022-10-23 (×2): qty 20

## 2022-10-23 MED ORDER — POLYETHYLENE GLYCOL 3350 17 G PO PACK: 17.0000 g | PACK | Freq: Every day | ORAL | Status: DC | PRN

## 2022-10-23 MED ORDER — GADOBUTROL 1 MMOL/ML IV SOLN
10.0000 mL | Freq: Once | INTRAVENOUS | Status: AC | PRN
  Administered 2022-10-23: 10 mL via INTRAVENOUS

## 2022-10-23 MED ORDER — HEPARIN (PORCINE) 25000 UT/250ML-% IV SOLN
1550.0000 [IU]/h | INTRAVENOUS | Status: DC
  Administered 2022-10-23: 1400 [IU]/h via INTRAVENOUS
  Administered 2022-10-24 – 2022-10-25 (×2): 1550 [IU]/h via INTRAVENOUS
  Filled 2022-10-23 (×5): qty 250

## 2022-10-23 MED ORDER — VANCOMYCIN HCL IN DEXTROSE 1-5 GM/200ML-% IV SOLN: 1000.0000 mg | INTRAVENOUS | Status: DC

## 2022-10-23 MED ORDER — DIPHENHYDRAMINE HCL 25 MG PO CAPS
25.0000 mg | ORAL_CAPSULE | Freq: Four times a day (QID) | ORAL | Status: DC | PRN
  Administered 2022-10-23 – 2022-10-25 (×6): 25 mg via ORAL
  Filled 2022-10-23 (×6): qty 1

## 2022-10-23 MED ORDER — VANCOMYCIN HCL 1500 MG/300ML IV SOLN
1500.0000 mg | INTRAVENOUS | Status: DC
  Administered 2022-10-24 – 2022-10-27 (×4): 1500 mg via INTRAVENOUS
  Filled 2022-10-23 (×4): qty 300

## 2022-10-23 MED ORDER — SODIUM CHLORIDE 0.9 % IV SOLN
2.0000 g | Freq: Two times a day (BID) | INTRAVENOUS | Status: DC
  Administered 2022-10-23 – 2022-10-26 (×8): 2 g via INTRAVENOUS
  Filled 2022-10-23 (×9): qty 20

## 2022-10-23 MED ORDER — CARVEDILOL 3.125 MG PO TABS
3.1250 mg | ORAL_TABLET | Freq: Two times a day (BID) | ORAL | Status: DC
  Administered 2022-10-23 – 2022-10-28 (×11): 3.125 mg via ORAL
  Filled 2022-10-23 (×11): qty 1

## 2022-10-23 MED ORDER — SODIUM CHLORIDE 0.9 % IV SOLN
2.0000 g | Freq: Once | INTRAVENOUS | Status: AC
  Administered 2022-10-23: 2 g via INTRAVENOUS
  Filled 2022-10-23: qty 20

## 2022-10-23 MED ORDER — PROCHLORPERAZINE EDISYLATE 10 MG/2ML IJ SOLN: 5.0000 mg | Freq: Four times a day (QID) | INTRAMUSCULAR | Status: DC | PRN

## 2022-10-23 NOTE — ED Notes (Signed)
ED TO INPATIENT HANDOFF REPORT  ED Nurse Name and Phone #: [phone] Jory Sims RN  S Name/Age/Gender [name] 57 y.o. female Room/Bed: 003C/003C  Code Status   Code Status: Full Code  Home/SNF/Other Home Patient oriented to: self, place, time, and situation Is this baseline? Yes   Triage Complete: Triage complete  Chief Complaint Headache [R51.9]  Triage Note Pt arrived via [facility] for c/o HA started at 0900 today. Pt c/o pins and needles on left side. Per EMS, stroke screen neg. LKW 0900 today.    Allergies Allergies  Allergen Reactions   Acetaminophen Other (See Comments)    Seizure-like "fits" as a child   Caffeine     Tense, anxiety, increased urination   [place] [Dapagliflozin] Other (See Comments)    Hallucinations, drop in blood sugar   Lisinopril Rash    Rash with lisinopril; but fosinopril is ok per patient    Level of Care/Admitting Diagnosis ED Disposition     ED Disposition  Admit   Condition  --   [place]: [facility] [[id number]]  Level of Care: Progressive [102]  Admit to Progressive based on following criteria: NEUROLOGICAL AND NEUROSURGICAL complex patients with significant risk of instability, who do not meet ICU criteria, yet require close observation or frequent assessment (< / = every 2 - 4 hours) with medical / nursing intervention.  May admit patient to [facility] or [facility] if equivalent level of care is available:: Yes  Covid Evaluation: Asymptomatic - no recent exposure (last 10 days) testing not required  Diagnosis: Headache [[id number]]  Admitting Physician: Kayleen Memos [[id number]]  Attending Physician: Kayleen Memos [[id number]]  Certification:: I certify this patient will need inpatient services for at least 2 midnights  Estimated Length of Stay: 2          B Medical/Surgery History Past Medical History:  Diagnosis Date   Acute on chronic diastolic congestive heart failure ([place]) 11/02/2013    10/03/2015, 11/13/2015, 08/03/2017   Benign essential HTN 11/28/2013   Bipolar disease, chronic ([facility])    Chest pain    a. 2012 Myoview: EF 63%, no isch/infarct;  b. 04/2016 Lexiscan MV: EF 73%, no ischemia/infarct-->Low risk.   Chronic diastolic CHF (congestive heart failure) ([place] [place]) 07/23/2011   a. 2015 Echo: EF 55-60%, Gr2 DD;  b. 09/2015 Echo: EF 60-65%, no rwma, mod dil LA, PASP 81mHg.   Cor pulmonale (chronic) (H[facility])    History of thyrotoxicosis    HTN (hypertension) 11/28/2013   Hypertensive heart disease 10/18/2013   Hypoglycemia    Insulin dependent type 2 diabetes mellitus, uncontrolled    Mediastinal adenopathy    Morbid obesity due to excess calories ([facility] 02/19/2011   Morbid obesity with BMI of 50.0-59.9, adult (HCC)    OSA (obstructive sleep apnea) 03/06/2011   Persistent atrial fibrillation ([facility] 12/09/2017   Pulmonary HTN, moderate to severe 11/03/2013   Sinusitis, chronic 01/02/2015   SVT (supraventricular tachycardia) 12/06/2013   Uncontrolled type 2 diabetes mellitus with hyperglycemia ([facility]    Past Surgical History:  Procedure Laterality Date   CARDIOVERSION N/A 04/05/2018   Procedure: CARDIOVERSION;  Surgeon: CLelon Perla MD;  [facility]  Service: Cardiovascular;  Laterality: N/A;   COLONOSCOPY WITH PROPOFOL Left 07/16/2018   Procedure: COLONOSCOPY WITH PROPOFOL;  Surgeon: KRonnette Juniper MD;  [facility];  Service: Gastroenterology;  Laterality: Left;   LEFT HEART CATH AND CORONARY ANGIOGRAPHY N/A 08/04/2018   Procedure: LEFT HEART CATH AND  CORONARY ANGIOGRAPHY;  Surgeon: Belva Crome, MD;  Location: [facility];  Service: Cardiovascular;  Laterality: N/A;   None     POLYPECTOMY  07/16/2018   Procedure: POLYPECTOMY;  Surgeon: Ronnette Juniper, MD;  [facility];  Service: Gastroenterology;;     A IV Location/Drains/Wounds Patient Lines/Drains/Airways Status     Active Line/Drains/Airways     Name Placement date Placement time Site  Days   Peripheral IV 10/23/22 18 G 1.16" Posterior;Right Wrist 10/23/22  0629  Wrist  less than 1            Intake/Output Last 24 hours  Intake/Output Summary (Last 24 hours) at 10/23/2022 1513 Last data filed at 10/23/2022 2595 Gross per 24 hour  Intake 1099 ml  Output --  Net 1099 ml    Labs/Imaging Results for orders placed or performed during the hospital encounter of 10/22/22 (from the past 48 hour(s))  Protime-INR     Status: Abnormal   Collection Time: 10/22/22  4:57 PM  Result Value Ref Range   Prothrombin Time 18.7 (H) 11.4 - 15.2 seconds   INR 1.6 (H) 0.8 - 1.2    Comment: (NOTE) INR goal varies based on device and disease states. Performed at [facility], [place] [address], [place], [place] [address]   APTT     Status: None   Collection Time: 10/22/22  4:57 PM  Result Value Ref Range   aPTT 35 24 - 36 seconds    Comment: Performed at [facility] [address] [place] [address]  CBC     Status: None   Collection Time: 10/22/22  4:57 PM  Result Value Ref Range   WBC 5.3 4.0 - 10.5 K/uL   RBC 4.63 3.87 - 5.11 MIL/uL   Hemoglobin 13.5 12.0 - 15.0 g/dL   HCT 41.4 36.0 - 46.0 %   MCV 89.4 80.0 - 100.0 fL   MCH 29.2 26.0 - 34.0 pg   MCHC 32.6 30.0 - 36.0 g/dL   RDW 14.0 11.5 - 15.5 %   Platelets 252 150 - 400 K/uL   nRBC 0.0 0.0 - 0.2 %    Comment: Performed at [facility], [place] [address] [place], [place] [address]  Differential     Status: None   Collection Time: 10/22/22  4:57 PM  Result Value Ref Range   Neutrophils Relative % 65 %   Neutro Abs 3.5 1.7 - 7.7 K/uL   Lymphocytes Relative 21 %   Lymphs Abs 1.1 0.7 - 4.0 K/uL   Monocytes Relative 10 %   Monocytes Absolute 0.5 0.1 - 1.0 K/uL   Eosinophils Relative 2 %   Eosinophils Absolute 0.1 0.0 - 0.5 K/uL   Basophils Relative 1 %   Basophils Absolute 0.0 0.0 - 0.1 K/uL   Immature Granulocytes 1 %   Abs Immature Granulocytes 0.03 0.00 - 0.07 K/uL    Comment: Performed  at [facility] [address] [place], [place] [address]  Comprehensive metabolic panel     Status: Abnormal   Collection Time: 10/22/22  4:57 PM  Result Value Ref Range   Sodium 138 135 - 145 mmol/L   Potassium 4.4 3.5 - 5.1 mmol/L   Chloride 101 98 - 111 mmol/L   CO2 26 22 - 32 mmol/L   Glucose, Bld 205 (H) 70 - 99 mg/dL    Comment: Glucose reference range applies only to samples taken after fasting  for at least 8 hours.   BUN 42 (H) 6 - 20 mg/dL   Creatinine, Ser 1.90 (H) 0.44 - 1.00 mg/dL   Calcium 9.1 8.9 - 10.3 mg/dL   Total Protein 7.6 6.5 - 8.1 g/dL   Albumin 3.7 3.5 - 5.0 g/dL   AST 24 15 - 41 U/L   ALT 14 0 - 44 U/L   Alkaline Phosphatase 88 38 - 126 U/L   Total Bilirubin 0.6 0.3 - 1.2 mg/dL   GFR, Estimated 31 (L) >60 mL/min    Comment: (NOTE) Calculated using the CKD-EPI Creatinine Equation (2021)    Anion gap 11 5 - 15    Comment: Performed at [facility] [address] [place], [place] [address]  Ethanol     Status: None   Collection Time: 10/22/22  4:57 PM  Result Value Ref Range   Alcohol, Ethyl (B) <10 <10 mg/dL    Comment: (NOTE) Lowest detectable limit for serum alcohol is 10 mg/dL.  For medical purposes only. Performed at [facility], [place] [address] [place], [place] [address]   Resp panel by RT-PCR (RSV, Flu A&B, Covid) Anterior Nasal Swab     Status: Abnormal   Collection Time: 10/22/22  8:32 PM   Specimen: Anterior Nasal Swab  Result Value Ref Range   SARS Coronavirus 2 by RT PCR POSITIVE (A) NEGATIVE    Comment: (NOTE) SARS-CoV-2 target nucleic acids are DETECTED.  The SARS-CoV-2 RNA is generally detectable in upper respiratory specimens during the acute phase of infection. Positive results are indicative of the presence of the identified virus, but do not rule out bacterial infection or co-infection with other pathogens not detected by the test. Clinical correlation with patient history and other diagnostic information  is necessary to determine patient infection status. The expected result is Negative.  Fact Sheet for Patients: EntrepreneurPulse.com.au  Fact Sheet for Healthcare Providers: IncredibleEmployment.be  This test is not yet approved or cleared by the [place] FDA and  has been authorized for detection and/or diagnosis of SARS-CoV-2 by FDA under an Emergency Use Authorization (EUA).  This EUA will remain in effect (meaning this test can be used) for the duration of  the COVID-19 declaration under Section 564(b)(1) of the A ct, 21 U.S.C. section 360bbb-3(b)(1), unless the authorization is terminated or revoked sooner.     Influenza A by PCR NEGATIVE NEGATIVE   Influenza B by PCR NEGATIVE NEGATIVE    Comment: (NOTE) The Xpert Xpress SARS-CoV-2/FLU/RSV plus assay is intended as an aid in the diagnosis of influenza from Nasopharyngeal swab specimens and should not be used as a sole basis for treatment. Nasal washings and aspirates are unacceptable for Xpert Xpress SARS-CoV-2/FLU/RSV testing.  Fact Sheet for Patients: EntrepreneurPulse.com.au  Fact Sheet for Healthcare Providers: IncredibleEmployment.be  This test is not yet approved or cleared by the [place] FDA and has been authorized for detection and/or diagnosis of SARS-CoV-2 by FDA under an Emergency Use Authorization (EUA). This EUA will remain in effect (meaning this test can be used) for the duration of the COVID-19 declaration under Section 564(b)(1) of the Act, 21 U.S.C. section 360bbb-3(b)(1), unless the authorization is terminated or revoked.     Resp Syncytial Virus by PCR NEGATIVE NEGATIVE    Comment: (NOTE) Fact Sheet for Patients: EntrepreneurPulse.com.au  Fact Sheet for Healthcare Providers: IncredibleEmployment.be  This test is not yet approved or cleared by the [place] FDA and has been  authorized for detection and/or diagnosis  of SARS-CoV-2 by FDA under an Emergency Use Authorization (EUA). This EUA will remain in effect (meaning this test can be used) for the duration of the COVID-19 declaration under Section 564(b)(1) of the Act, 21 U.S.C. section 360bbb-3(b)(1), unless the authorization is terminated or revoked.  Performed at [facility], [place] [address] [place], [place] [address]   CBG monitoring, ED     Status: Abnormal   Collection Time: 10/23/22  8:56 AM  Result Value Ref Range   Glucose-Capillary 153 (H) 70 - 99 mg/dL    Comment: Glucose reference range applies only to samples taken after fasting for at least 8 hours.  CBG monitoring, ED     Status: None   Collection Time: 10/23/22  1:34 PM  Result Value Ref Range   Glucose-Capillary 77 70 - 99 mg/dL    Comment: Glucose reference range applies only to samples taken after fasting for at least 8 hours.   DG CHEST PORT 1 VIEW  Result Date: 10/23/2022 CLINICAL DATA:  COVID-19 infection.  [id number]. EXAM: PORTABLE CHEST 1 VIEW COMPARISON:  AP Lat chest 05/25/2022 FINDINGS: Stable moderate cardiomegaly. Central vascular prominence is again noted with interstitial and patchy hazy opacities in the perihilar and lower lung zones which could be due to edema, pneumonia or combination. Small pleural effusions are beginning to form. The upper 1/3 of the lungs remain clear. The mediastinum is normally outlined.  No acute osseous findings. IMPRESSION: Cardiomegaly with central vascular prominence and interstitial and patchy hazy opacities in the perihilar and lower lung zones, which could be due to edema, pneumonia or combination. Clinical correlation and radiographic follow-up recommended. Electronically Signed   By: Telford Nab M.D.   On: 10/23/2022 06:04   MR BRAIN W CONTRAST  Result Date: 10/23/2022 CLINICAL DATA:  Initial evaluation for abnormal brain MRI, possible meningitis. EXAM: MRI HEAD WITH CONTRAST  TECHNIQUE: Multiplanar, multiecho pulse sequences of the brain and surrounding structures were obtained with intravenous contrast. CONTRAST:  64m GADAVIST GADOBUTROL 1 MMOL/ML IV SOLN COMPARISON:  Prior noncontrast brain MRI from 10/22/2022. FINDINGS: Examination degraded by motion artifact. Postcontrast imaging of the brain demonstrates no abnormal enhancement. Specifically, no abnormal leptomeningeal enhancement seen within the left temporal region to correspond with previously noted signal abnormality. No other pathologic enhancement elsewhere within the brain. No new findings are identified. IMPRESSION: No abnormal intracranial enhancement to correspond with previously seen signal abnormality. Given this, as well as the relative lack of corresponding signal changes on corresponding sequences, it could be that the previously noted FLAIR signal was artifactual, as there is some motion through this region on prior study. If the patient is able to tolerate, a repeat FLAIR sequence (preferably on the 1.5 Tesla magnet) could be performed to evaluate whether these signal changes persist. Electronically Signed   By: BJeannine BogaM.D.   On: 10/23/2022 02:17   MR BRAIN WO CONTRAST  Result Date: 10/23/2022 CLINICAL DATA:  Initial evaluation for neuro deficit, headache. EXAM: MRI HEAD WITHOUT CONTRAST TECHNIQUE: Multiplanar, multiecho pulse sequences of the brain and surrounding structures were obtained without intravenous contrast. COMPARISON:  CT from earlier the same day. FINDINGS: Brain: Examination degraded by motion artifact. Cerebral volume within normal limits. Patchy T2/FLAIR hyperintensity involving the periventricular and deep white matter both cerebral hemispheres, most characteristic of chronic microvascular ischemic disease. Few scattered remote cortical infarcts involving the right parietal and left occipital lobes noted. No evidence for acute or subacute infarct. Gray-white matter  differentiation maintained. No other  areas of chronic cortical infarction. There is sulcal FLAIR signal abnormality involving the peripheral left temporal lobe (series 11, image 15). Finding is of uncertain etiology and is age indeterminate. No associated susceptibility artifact to suggest hemorrhage. Additionally, no subarachnoid hemorrhage seen at this location on prior CT. No associated parenchymal edema. No mass lesion or midline shift. No hydrocephalus or extra-axial fluid collection. Partially empty sella noted. Suprasellar region normal. Vascular: Major intracranial vascular flow voids are maintained. Skull and upper cervical spine: Craniocervical junction grossly normal. Bone marrow signal intensity grossly within normal limits. No scalp soft tissue abnormality. Sinuses/Orbits: Globes orbital soft tissues within normal limits. Scattered mucosal thickening with a few retention cyst noted within the paranasal sinuses. Superimposed pneumatized secretions noted at the right maxillary sinus. No mastoid effusion. Other: None. IMPRESSION: 1. Sulcal FLAIR signal abnormality involving the peripheral left temporal lobe, age indeterminate and of uncertain etiology. No other evidence for subarachnoid hemorrhage at this location on this exam or prior CT. While this could be chronic in nature, possible meningitis or other inflammatory process could also conceivably have this appearance. Correlation with laboratory values and symptomatology recommended. Further evaluation with dedicated LP and CSF values recommended as warranted. 2. No other acute intracranial abnormality. 3. Underlying chronic microvascular ischemic disease with a few scattered remote cortical infarcts involving the right parietal and left occipital lobes. Electronically Signed   By: Jeannine Boga M.D.   On: 10/23/2022 00:25   CT ANGIO HEAD NECK W WO CM  Result Date: 10/22/2022 CLINICAL DATA:  Neuro deficit, acute, stroke suspected headache and  left arm numbness EXAM: CT ANGIOGRAPHY HEAD AND NECK TECHNIQUE: Multidetector CT imaging of the head and neck was performed using the standard protocol during bolus administration of intravenous contrast. Multiplanar CT image reconstructions and MIPs were obtained to evaluate the vascular anatomy. Carotid stenosis measurements (when applicable) are obtained utilizing NASCET criteria, using the distal internal carotid diameter as the denominator. RADIATION DOSE REDUCTION: This exam was performed according to the departmental dose-optimization program which includes automated exposure control, adjustment of the mA and/or kV according to patient size and/or use of iterative reconstruction technique. CONTRAST:  72m OMNIPAQUE IOHEXOL 350 MG/ML SOLN COMPARISON:  Same day CT head. FINDINGS: CTA NECK FINDINGS Aortic arch: Great vessel origins are patent without significant stenosis. Right carotid system: Tortuous. No evidence of dissection, stenosis (50% or greater), or occlusion. Left carotid system: Tortuous. No evidence of dissection, stenosis (50% or greater), or occlusion. Vertebral arteries: Codominant. No evidence of dissection, stenosis (50% or greater), or occlusion. Skeleton: No acute findings. Other neck: No acute findings. Upper chest: Approximately 4 mm pulmonary nodule in the right upper lobe. Review of the MIP images confirms the above findings CTA HEAD FINDINGS Anterior circulation: Bilateral intracranial ICAs, MCAs, and ACAs are patent without proximal hemodynamically significant stenosis. Posterior circulation: Bilateral intradural vertebral arteries, basilar artery and bilateral posterior cerebral arteries are patent without proximal hemodynamically significant stenosis. Venous sinuses: As permitted by contrast timing, patent. Review of the MIP images confirms the above findings IMPRESSION: 1. No large vessel occlusion or proximal hemodynamically significant stenosis. 2. Approximately 4 mm pulmonary  nodule in the right upper lobe. No follow-up needed if patient is low-risk.This recommendation follows the consensus statement: Guidelines for Management of Incidental Pulmonary Nodules Detected on CT Images: From the Fleischner Society 2017; Radiology 2017; 284:228-243. Electronically Signed   By: FMargaretha SheffieldM.D.   On: 10/22/2022 19:32   CT HEAD WO CONTRAST  Result Date: 10/22/2022 CLINICAL  DATA:  Neuro deficit, acute, stroke suspected EXAM: CT HEAD WITHOUT CONTRAST TECHNIQUE: Contiguous axial images were obtained from the base of the skull through the vertex without intravenous contrast. RADIATION DOSE REDUCTION: This exam was performed according to the departmental dose-optimization program which includes automated exposure control, adjustment of the mA and/or kV according to patient size and/or use of iterative reconstruction technique. COMPARISON:  CT head 12/07/2013 FINDINGS: Brain: Interval development of age-indeterminate, likely chronic, right parietooccipital loss of gray-white matter differentiation (7:57). Interval development of an age-indeterminate, possibly acute/subacute, left occipital infarction (7:62). No evidence of large-territorial acute infarction. No parenchymal hemorrhage. No mass lesion. No extra-axial collection. No mass effect or midline shift. No hydrocephalus. Basilar cisterns are patent. Vascular: No hyperdense vessel. Skull: No acute fracture or focal lesion. Sinuses/Orbits: Partial opacification of bilateral sphenoid, right frontal, right maxillary sinuses. Mild mucosal thickening in left maxillary sinus. Otherwise remaining paranasal sinuses and mastoid air cells are clear. The orbits are unremarkable. Other: None. IMPRESSION: 1. Interval development of an age-indeterminate, possibly acute/subacute, left occipital infarction. Interval development of an age-indeterminate, likely chronic, right parietooccipital loss of gray-white matter differentiation. Consider MRI without  contrast for further evaluation. 2. No acute intracranial hemorrhage. These results were called by telephone at the time of interpretation on 10/22/2022 at 7:05 pm to provider Dr. Orpah Melter, who verbally acknowledged these results. Electronically Signed   By: Iven Finn M.D.   On: 10/22/2022 19:06    Pending Labs Unresulted Labs (From admission, onward)     Start     Ordered   10/23/22 2000  Heparin level (unfractionated)  Once-Timed,   TIMED        10/23/22 0641   10/23/22 2000  APTT  Once-Timed,   TIMED        10/23/22 0641            Vitals/Pain Today's Vitals   10/23/22 1032 10/23/22 1300 10/23/22 1414 10/23/22 1506  BP:  (!) 119/90  116/84  Pulse:  (!) 101  88  Resp:  (!) 30  (!) 22  Temp: (!) 97.5 F (36.4 C)  97.7 F (36.5 C) (!) 97.4 F (36.3 C)  TempSrc: Oral  Oral   SpO2:  97%  98%  Weight:      Height:      PainSc:        Isolation Precautions Airborne and Contact precautions  Medications Medications  sodium chloride flush (NS) 0.9 % injection 3 mL (3 mLs Intravenous Not Given 10/22/22 2040)  cefTRIAXone (ROCEPHIN) 2 g in sodium chloride 0.9 % 100 mL IVPB (0 g Intravenous Stopped 10/23/22 1027)  ampicillin (OMNIPEN) 2 g in sodium chloride 0.9 % 100 mL IVPB (2 g Intravenous New Bag/Given 10/23/22 1510)  carvedilol (COREG) tablet 3.125 mg (3.125 mg Oral Given 10/23/22 9509)  remdesivir 200 mg in sodium chloride 0.9% 250 mL IVPB (0 mg Intravenous Stopped 10/23/22 0900)    Followed by  remdesivir 100 mg in sodium chloride 0.9 % 100 mL IVPB (has no administration in time range)  guaiFENesin-dextromethorphan (ROBITUSSIN DM) 100-10 MG/5ML syrup 5 mL (has no administration in time range)  diphenhydrAMINE (BENADRYL) capsule 25 mg (25 mg Oral Given 10/23/22 3267)  prochlorperazine (COMPAZINE) injection 5 mg (has no administration in time range)  polyethylene glycol (MIRALAX / GLYCOLAX) packet 17 g (has no administration in time range)  heparin ADULT infusion 100 units/mL  (25000 units/268m) (has no administration in time range)  vancomycin (VANCOREADY) IVPB 1500 mg/300 mL (has no administration  in time range)  insulin aspart (novoLOG) injection 0-15 Units (has no administration in time range)  insulin aspart (novoLOG) injection 0-5 Units (has no administration in time range)  iohexol (OMNIPAQUE) 350 MG/ML injection 75 mL (75 mLs Intravenous Contrast Given 10/22/22 1917)  lactated ringers bolus 1,000 mL (0 mLs Intravenous Stopped 10/23/22 0105)  prochlorperazine (COMPAZINE) injection 10 mg (10 mg Intravenous Given 10/22/22 2034)  diphenhydrAMINE (BENADRYL) injection 12.5 mg (12.5 mg Intravenous Given 10/22/22 2034)  LORazepam (ATIVAN) tablet 1 mg (1 mg Oral Given 10/22/22 0100)  diazepam (VALIUM) injection 5 mg (5 mg Intravenous Given 10/23/22 0114)  vancomycin (VANCOCIN) IVPB 1000 mg/200 mL premix (0 mg Intravenous Stopped 10/23/22 0500)  cefTRIAXone (ROCEPHIN) 2 g in sodium chloride 0.9 % 100 mL IVPB (0 g Intravenous Stopped 10/23/22 0320)  ampicillin (OMNIPEN) 2 g in sodium chloride 0.9 % 100 mL IVPB (0 g Intravenous Stopped 10/23/22 0239)  gadobutrol (GADAVIST) 1 MMOL/ML injection 10 mL (10 mLs Intravenous Contrast Given 10/23/22 0148)  vancomycin (VANCOCIN) IVPB 1000 mg/200 mL premix (0 mg Intravenous Stopped 10/23/22 0741)    Mobility walks     Focused Assessments Neuro Assessment Handoff:  Swallow screen pass? Yes  Cardiac Rhythm: Atrial fibrillation NIH Stroke Scale  Dizziness Present: No Headache Present: No Interval: Shift assessment Level of Consciousness (1a.)   : Alert, keenly responsive LOC Questions (1b. )   : Answers both questions correctly LOC Commands (1c. )   : Performs both tasks correctly Best Gaze (2. )  : Normal Visual (3. )  : No visual loss Facial Palsy (4. )    : Normal symmetrical movements Motor Arm, Left (5a. )   : No drift Motor Arm, Right (5b. ) : No drift Motor Leg, Left (6a. )  : No drift Motor Leg, Right (6b. ) : No  drift Limb Ataxia (7. ): Absent Sensory (8. )  : Mild-to-moderate sensory loss, patient feels pinprick is less sharp or is dull on the affected side, or there is a loss of superficial pain with pinprick, but patient is aware of being touched Best Language (9. )  : No aphasia Dysarthria (10. ): Normal Extinction/Inattention (11.)   : No Abnormality Complete NIHSS TOTAL: 1 Last date known well: 10/22/22 Last time known well: 0900 Neuro Assessment:   Neuro Checks:   Initial (10/22/22 1642)  Has TPA been given? No If patient is a Neuro Trauma and patient is going to OR before floor call report to [place] nurse: [phone] or [phone]   R Recommendations: See Admitting Provider Note  Report given to:   Additional Notes: Pt ambulatory within room using bedside commode. Pt continues to remove monitoring equipment despite education to keep all monitoring equipment on. Per report pt has removed IVs. Currently still has the 18g R hand/wrist in w/ abx infusing. Once abx is finished infusing, heparin gtt will be started. Still waiting on IV team for 2nd access.

## 2022-10-23 NOTE — ED Notes (Signed)
Pt removed dexcom and set it on the counter

## 2022-10-23 NOTE — ED Notes (Signed)
Patient transported to MRI 

## 2022-10-23 NOTE — ED Notes (Signed)
IV team at bedside 

## 2022-10-23 NOTE — Progress Notes (Signed)
ANTICOAGULATION CONSULT NOTE - Initial Consult  Pharmacy Consult for Heparin (holding Xarelto) Indication: atrial fibrillation  Allergies  Allergen Reactions   Acetaminophen Other (See Comments)    Seizure-like "fits" as a child   Caffeine     Tense, anxiety, increased urination   [place] [Dapagliflozin] Other (See Comments)    Hallucinations, drop in blood sugar   Lisinopril Rash    Rash with lisinopril; but fosinopril is ok per patient    Patient Measurements: Height: '5\' 7"'$  (170.2 cm) Weight: (!) 140.6 kg (309 lb 15.5 oz) IBW/kg (Calculated) : 61.6  Vital Signs: Temp: 97.8 F (36.6 C) (01/26 0130) Temp Source: Oral (01/26 0130) BP: 122/82 (01/26 0230) Pulse Rate: 83 (01/26 0230)  Labs: Recent Labs    10/22/22 1657  HGB 13.5  HCT 41.4  PLT 252  APTT 35  LABPROT 18.7*  INR 1.6*  CREATININE 1.90*    Estimated Creatinine Clearance: 49.8 mL/min (A) (by C-G formula based on SCr of 1.9 mg/dL (H)).   Medical History: Past Medical History:  Diagnosis Date   Acute on chronic diastolic congestive heart failure ([place]) 11/02/2013   10/03/2015, 11/13/2015, 08/03/2017   Benign essential HTN 11/28/2013   Bipolar disease, chronic ([place])    Chest pain    a. 2012 Myoview: EF 63%, no isch/infarct;  b. 04/2016 Lexiscan MV: EF 73%, no ischemia/infarct-->Low risk.   Chronic diastolic CHF (congestive heart failure) ([place]) 07/23/2011   a. 2015 Echo: EF 55-60%, Gr2 DD;  b. 09/2015 Echo: EF 60-65%, no rwma, mod dil LA, PASP 59mHg.   Cor pulmonale (chronic) (HCC)    History of thyrotoxicosis    HTN (hypertension) 11/28/2013   Hypertensive heart disease 10/18/2013   Hypoglycemia    Insulin dependent type 2 diabetes mellitus, uncontrolled    Mediastinal adenopathy    Morbid obesity due to excess calories ([place] 02/19/2011   Morbid obesity with BMI of 50.0-59.9, adult (HCC)    OSA (obstructive sleep apnea) 03/06/2011   Persistent atrial fibrillation ([place] 12/09/2017   Pulmonary HTN, moderate  to severe 11/03/2013   Sinusitis, chronic 01/02/2015   SVT (supraventricular tachycardia) 12/06/2013   Uncontrolled type 2 diabetes mellitus with hyperglycemia ([facility]     Assessment: 57y/o F on Xarelto PTA for afib, holding Xarelto and starting heparin drip in anticipation of LP per neurology recommendation. Per pt, last dose of Xarelto was about 18 hours ago, will start Heparin at noon today. Anticipate using aPTT to dose for now. Labs above reviewed.   Goal of Therapy:  Heparin level 0.3-0.7 units/ml aPTT 66-102 seconds Monitor platelets by anticoagulation protocol: Yes   Plan:  Start heparin drip at 1400 units/hr today at 1200 2000 aPTT and heparin level Daily CBC, heparin level, and aPTT Monitor for bleeding F/U LP timing and re-start of XHartley PharmD, BCPS Clinical Pharmacist Phone: [phone]

## 2022-10-23 NOTE — ED Provider Notes (Signed)
I assumed care of this patient.  Please see previous provider note for further details of Hx, PE.  Briefly patient is a 57 y.o. female who presented headache and left-sided paresthesias pending MRI  MRI notable for left temporal enhancement concerning for infection versus bleed. Dr. Malen Gauze recommended starting patient on empiric antibiotics for meningitis.  Patient is unable to be LP due to anticoagulation. He also recommended holding Xarelto as this could possibly be related to coagulopathy from COVID/Xarelto.  He recommended MRI with contrast and admission to hospitalist service I spoke with Dr. Nevada Crane who admitted patient      Fatima Blank, MD 10/23/22 [phone]

## 2022-10-23 NOTE — Consult Note (Addendum)
Neurology Consultation  Reason for Consult: Headache, left-sided numbness, abnormal imaging findings on brain scans Referring Physician: Dr. Mayra Neer  CC: Headache  History is obtained from: Patient  HPI: Jaydn Fincher is a 57 y.o. female past medical history of atrial fibrillation on Xarelto, mediastinal adenopathy, sleep apnea, uncontrolled diabetes, hypertension, presenting to the emergency room with complaints of left-sided headache which is 10 on 10 in nature that she has never had before, no associated visual changes but numbness in the left arm and leg.  She reports that it feels like her whole left side has had cold water on it.  No facial drooping.  No visual symptoms. She reports she has been having respiratory symptoms of cough production and sneezing as well as been feeling feverish for the past few days.  Her COVID test came positive in the ER. Case was discussed with him initially over the phone for imaging recommendations.  I recommended imaging of the brain with an MRI given that CT had some concern for age-indeterminate strokes.  MRI of the brain reveals abnormal sulcal signal on FLAIR images in the left hemisphere raising concern for meningitis versus bleed. Neurology was formally consulted after the imaging findings for next steps in management.    LKW: Multiple days ago IV thrombolysis given?: no, outside the window Premorbid modified Rankin scale (mRS): 0 ROS: Full ROS was performed and is negative except as noted in the HPI.   Past Medical History:  Diagnosis Date   Acute on chronic diastolic congestive heart failure ([place]) 11/02/2013   10/03/2015, 11/13/2015, 08/03/2017   Benign essential HTN 11/28/2013   Bipolar disease, chronic ([place])    Chest pain    a. 2012 Myoview: EF 63%, no isch/infarct;  b. 04/2016 Lexiscan MV: EF 73%, no ischemia/infarct-->Low risk.   Chronic diastolic CHF (congestive heart failure) ([place]) 07/23/2011   a. 2015 Echo: EF 55-60%, Gr2 DD;  b. 09/2015  Echo: EF 60-65%, no rwma, mod dil LA, PASP 33mHg.   Cor pulmonale (chronic) (HCC)    History of thyrotoxicosis    HTN (hypertension) 11/28/2013   Hypertensive heart disease 10/18/2013   Hypoglycemia    Insulin dependent type 2 diabetes mellitus, uncontrolled    Mediastinal adenopathy    Morbid obesity due to excess calories ([place] 02/19/2011   Morbid obesity with BMI of 50.0-59.9, adult (HCC)    OSA (obstructive sleep apnea) 03/06/2011   Persistent atrial fibrillation ([facility] 12/09/2017   Pulmonary HTN, moderate to severe 11/03/2013   Sinusitis, chronic 01/02/2015   SVT (supraventricular tachycardia) 12/06/2013   Uncontrolled type 2 diabetes mellitus with hyperglycemia ([facility]      Family History  Problem Relation Age of Onset   Heart failure Father    Stroke Father    Hypertension Mother    Heart disease Maternal Grandfather      Social History:   reports that she has never smoked. She has never used smokeless tobacco. She reports that she does not drink alcohol and does not use drugs.  Medications  Current Facility-Administered Medications:    ampicillin (OMNIPEN) 2 g in sodium chloride 0.9 % 100 mL IVPB, 2 g, Intravenous, Once, Cardama, PGrayce Sessions MD   cefTRIAXone (ROCEPHIN) 2 g in sodium chloride 0.9 % 100 mL IVPB, 2 g, Intravenous, Once, Cardama, PGrayce Sessions MD   diazepam (VALIUM) injection 5 mg, 5 mg, Intravenous, Once, Cardama, PGrayce Sessions MD   sodium chloride flush (NS) 0.9 % injection 3 mL, 3 mL, Intravenous, Once, NAudley Hose MD  vancomycin (VANCOCIN) IVPB 1000 mg/200 mL premix, 1,000 mg, Intravenous, Once, Cardama, Grayce Sessions, MD  Current Outpatient Medications:    Accu-Chek Softclix Lancets lancets, Use up to 4 times daily (Patient not taking: Reported on 10/15/2022), Disp: 100 each, Rfl: 0   amLODipine (NORVASC) 5 MG tablet, Take 1 tablet (5 mg total) by mouth daily., Disp: 90 tablet, Rfl: 3   Blood Glucose Monitoring Suppl (ACCU-CHEK GUIDE) w/Device KIT,  Use to check blood sugar three times daily. (Patient not taking: Reported on 10/15/2022), Disp: 1 kit, Rfl: 0   Blood Pressure Monitor DEVI, Please provide patient with insurance approved blood pressure monitor I10.0 (Patient not taking: Reported on 10/15/2022), Disp: 1 each, Rfl: 0   carvedilol (COREG) 25 MG tablet, Take 1 tablet (25 mg total) by mouth 2 (two) times daily with a meal., Disp: 60 tablet, Rfl: 3   empagliflozin (JARDIANCE) 10 MG TABS tablet, Take 1 tablet (10 mg total) by mouth daily before breakfast., Disp: 30 tablet, Rfl: 6   glipiZIDE (GLUCOTROL XL) 10 MG 24 hr tablet, Take 1 tablet (10 mg total) by mouth daily with breakfast., Disp: 90 tablet, Rfl: 0   glucose blood (ACCU-CHEK GUIDE) test strip, Use as directed up to 4 times daily (Patient not taking: Reported on 10/15/2022), Disp: 100 each, Rfl: 0   hydrALAZINE (APRESOLINE) 25 MG tablet, Take 1 tablet (25 mg total) by mouth 3 (three) times daily., Disp: 90 tablet, Rfl: 3   hydrOXYzine (VISTARIL) 25 MG capsule, Take 1 capsule (25 mg total) by mouth every 8 (eight) hours as needed., Disp: 60 capsule, Rfl: 1   Incontinence Supply Disposable (INCONTINENCE BRIEF LARGE) MISC, Please provide patient with insurance approved incontinence supplies/briefs, Disp: 18 each, Rfl: 6   metFORMIN (GLUCOPHAGE) 1000 MG tablet, Take 1 tablet (1,000 mg total) by mouth 2 (two) times daily with a meal., Disp: 60 tablet, Rfl: 3   Naphazoline-Pheniramine (EYE ALLERGY RELIEF OP), Place 1 drop into both eyes daily as needed (allergy). (Patient not taking: Reported on 10/22/2022), Disp: , Rfl:    risperiDONE (RISPERDAL) 2 MG tablet, Take 1 tablet (2 mg total) by mouth at bedtime., Disp: 30 tablet, Rfl: 3   rivaroxaban (XARELTO) 20 MG TABS tablet, Take 1 tablet (20 mg total) by mouth daily with supper., Disp: 30 tablet, Rfl: 3   sacubitril-valsartan (ENTRESTO) 97-103 MG, Take 1 tablet by mouth 2 (two) times daily., Disp: 60 tablet, Rfl: 10   spironolactone  (ALDACTONE) 25 MG tablet, Take 1 tablet (25 mg total) by mouth daily., Disp: 30 tablet, Rfl: 3   torsemide (DEMADEX) 20 MG tablet, Take 3 tablets daily alternating with 2 tablets daily, Disp: 360 tablet, Rfl: 0   Exam: Current vital signs: BP 120/83   Pulse 80   Temp 97.7 F (36.5 C) (Oral)   Resp (!) 22   Ht '5\' 7"'$  (1.702 m)   Wt (!) 140.6 kg   SpO2 97%   BMI 48.55 kg/m  Vital signs in last 24 hours: Temp:  [97.5 F (36.4 C)-97.7 F (36.5 C)] 97.7 F (36.5 C) (01/25 2129) Pulse Rate:  [80-100] 80 (01/26 0045) Resp:  [18-24] 22 (01/26 0045) BP: (103-131)/(75-91) 120/83 (01/26 0045) SpO2:  [94 %-99 %] 97 % (01/26 0045) Weight:  [140.6 kg] 140.6 kg (01/25 1640) General: Awake alert in no distress HEENT: Normocephalic atraumatic, neck supple Lungs: Clear Cardiovascular: Irregularly irregular rhythm Abdomen nondistended nontender Extremities warm well-perfused Neurologic exam Awake alert oriented x 3 No dysarthria No aphasia Cranial nerves  II to XII intact Motor examination with no deficits Sensation exam shows diminished sensation on the left in comparison to the right Coordination with no dysmetria NIH stroke scale-1  Labs I have reviewed labs in epic and the results pertinent to this consultation are: CBC    Component Value Date/Time   WBC 5.3 10/22/2022 1657   RBC 4.63 10/22/2022 1657   HGB 13.5 10/22/2022 1657   HGB 12.5 08/20/2021 1522   HCT 41.4 10/22/2022 1657   HCT 39.8 08/20/2021 1522   PLT 252 10/22/2022 1657   PLT 200 08/20/2021 1522   MCV 89.4 10/22/2022 1657   MCV 84 08/20/2021 1522   MCH 29.2 10/22/2022 1657   MCHC 32.6 10/22/2022 1657   RDW 14.0 10/22/2022 1657   RDW 16.0 (H) 08/20/2021 1522   LYMPHSABS 1.1 10/22/2022 1657   LYMPHSABS 1.1 07/20/2018 1140   MONOABS 0.5 10/22/2022 1657   EOSABS 0.1 10/22/2022 1657   EOSABS 0.0 07/20/2018 1140   BASOSABS 0.0 10/22/2022 1657   BASOSABS 0.0 07/20/2018 1140    CMP     Component Value  Date/Time   NA 138 10/22/2022 1657   NA 141 06/22/2022 1023   K 4.4 10/22/2022 1657   CL 101 10/22/2022 1657   CO2 26 10/22/2022 1657   GLUCOSE 205 (H) 10/22/2022 1657   BUN 42 (H) 10/22/2022 1657   BUN 36 (H) 06/22/2022 1023   CREATININE 1.90 (H) 10/22/2022 1657   CREATININE 0.81 08/06/2016 1245   CALCIUM 9.1 10/22/2022 1657   PROT 7.6 10/22/2022 1657   PROT 7.9 07/30/2021 1644   ALBUMIN 3.7 10/22/2022 1657   ALBUMIN 4.4 07/30/2021 1644   AST 24 10/22/2022 1657   ALT 14 10/22/2022 1657   ALKPHOS 88 10/22/2022 1657   BILITOT 0.6 10/22/2022 1657   BILITOT 0.5 07/30/2021 1644   GFRNONAA 31 (L) 10/22/2022 1657   GFRNONAA 86 08/06/2016 1245   GFRAA 77 09/17/2020 1219   GFRAA >89 08/06/2016 1245    Lipid Panel     Component Value Date/Time   CHOL 129 10/02/2020 1511   TRIG 214 (H) 10/02/2020 1511   HDL 43 10/02/2020 1511   CHOLHDL 3.0 10/02/2020 1511   CHOLHDL 2.9 12/16/2019 0302   VLDL 11 12/16/2019 0302   LDLCALC 52 10/02/2020 1511     Imaging I have reviewed the images obtained: Concern for age-indeterminate acute/subacute left occipital infarction as well as chronic right temporoparietal occipital stroke.  CT angiography head and neck with no emergent LVO.  4 mm pulmonary nodule in the right upper lobe needs outpatient follow-up. MRI brain shows sulcal FLAIR abnormality in the peripheral left temporal lobe age-indeterminate and of uncertain etiology with no other evidence for subarachnoid hemorrhage at this location on this exam or prior CT.  While this could be chronic in nature, possible meningitis or inflammatory process can also have similar appearance.  Correlation with lab values symptomatology recommended further evaluation with dedicated LP and CSF values recommended as warranted.  No other acute intracranial abnormality.  Underlying chronic microvascular disease.  Assessment: 57 year old with above past medical history presenting with concerns for left-sided  numbness and left-sided headache, along with some respiratory symptoms that have been going on for a few days. CT head had some concern for an age-indeterminate left-sided infarct and a chronic right-sided infarct for which an MRI was done which revealed sulcal abnormality-FLAIR hyperintensity in the left temporal sulci raising concern for underlying infection versus blood-chronicity undetermined. The best next course of  action is to perform a spinal tap for clarification but the patient is on Xarelto for atrial fibrillation and Xarelto will be needed to be on hold for at least 2 days before an LP can be done.  She is also COVID-positive, which could have caused coagulopathy and some sort of a bleed. Until the diagnosis is clear the differentials are as below: - Meningitis - Subacute or chronic convexity subarachnoid hemorrhage - COVID-related coagulopathy causing the subarachnoid hemorrhage  Recommendations: Meningitic coverage antibiotics with ceftriaxone, vancomycin and ampicillin. Hold Xarelto.  Will need at least 2 days off of Xarelto before LP is attempted. In the interim, can use IV heparin for atrial fibrillation, which will need to be shut off a few hours before the LP and then again resumed few hours after or Xarelto can be resumed-will defer that to when the LP is done. MRI brain WITH contrast (already has had without contrast images done a few hours ago) Repeat head CT in 24 hours to see if there is any change or evidence of acute bleed. Since the imaging abnormality could be bleed, avoid NSAIDs for headache control.  Use Tylenol.  Can use very low-dose opiates or tramadol if required.  Can also use Compazine.  Further recommendations based on clinical course and testing. Plan was discussed with Dr. Leonette Monarch in the ER. Neurology will follow with you.  -- Amie Portland, MD Neurologist [facility] Pager: [phone]

## 2022-10-23 NOTE — Progress Notes (Signed)
ANTICOAGULATION CONSULT NOTE - Initial Consult  Pharmacy Consult for Heparin (holding Xarelto) Indication: atrial fibrillation  Allergies  Allergen Reactions   Acetaminophen Other (See Comments)    Seizure-like "fits" as a child   Caffeine     Tense, anxiety, increased urination   [place] [Dapagliflozin] Other (See Comments)    Hallucinations, drop in blood sugar   Pork-Derived Products Other (See Comments)    Religious reasons. Pt took in the past   Lisinopril Rash    Rash with lisinopril; but fosinopril is ok per patient    Patient Measurements: Height: '5\' 7"'$  (170.2 cm) Weight: (!) 140.6 kg (309 lb 15.5 oz) IBW/kg (Calculated) : 61.6 Heparin dosing weight 96kg  Vital Signs: Temp: 96 F (35.6 C) (01/26 2038) Temp Source: Axillary (01/26 2038) BP: 107/70 (01/26 2038) Pulse Rate: 90 (01/26 2038)  Labs: Recent Labs    10/22/22 1657 10/23/22 2016  HGB 13.5  --   HCT 41.4  --   PLT 252  --   APTT 35 48*  LABPROT 18.7*  --   INR 1.6*  --   HEPARINUNFRC  --  0.41  CREATININE 1.90*  --      Estimated Creatinine Clearance: 49.8 mL/min (A) (by C-G formula based on SCr of 1.9 mg/dL (H)).   Medical History: Past Medical History:  Diagnosis Date   Acute on chronic diastolic congestive heart failure ([place]) 11/02/2013   10/03/2015, 11/13/2015, 08/03/2017   Benign essential HTN 11/28/2013   Bipolar disease, chronic ([place])    Chest pain    a. 2012 Myoview: EF 63%, no isch/infarct;  b. 04/2016 Lexiscan MV: EF 73%, no ischemia/infarct-->Low risk.   Chronic diastolic CHF (congestive heart failure) ([place]) 07/23/2011   a. 2015 Echo: EF 55-60%, Gr2 DD;  b. 09/2015 Echo: EF 60-65%, no rwma, mod dil LA, PASP 51mHg.   Cor pulmonale (chronic) (HCC)    History of thyrotoxicosis    HTN (hypertension) 11/28/2013   Hypertensive heart disease 10/18/2013   Hypoglycemia    Insulin dependent type 2 diabetes mellitus, uncontrolled    Mediastinal adenopathy    Morbid obesity due to excess  calories ([place] 02/19/2011   Morbid obesity with BMI of 50.0-59.9, adult (HCC)    OSA (obstructive sleep apnea) 03/06/2011   Persistent atrial fibrillation ([place] 12/09/2017   Pulmonary HTN, moderate to severe 11/03/2013   Sinusitis, chronic 01/02/2015   SVT (supraventricular tachycardia) 12/06/2013   Uncontrolled type 2 diabetes mellitus with hyperglycemia ([facility]     Assessment: 57y/o F on Xarelto PTA for afib, holding Xarelto and starting heparin drip in anticipation of LP per neurology recommendation. Per pt, last dose of Xarelto was 1/24 '@1800'$ .  Verified with patient on 1/26: okay to receive heparin (pork-derived product) despite religious status and allergy documented.  Heparin level 0.41 units/mL and aPTT 48 while on heparin 1400 units/hr. Heparin level is lower than anticipated based on recent Xarelto use; however, it is not correlating with aPTT so hard to determine if truly accurate.   Since aPTT is low and heparin level is on the lower end of goal, will increase heparin rate as a precaution.   Goal of Therapy:  Heparin level 0.3-0.7 units/ml aPTT 66-102 seconds Monitor platelets by anticoagulation protocol: Yes   Plan:  Increase heparin to 1550 units/hr Check aPTT/heparin level in 6 hours F/U LP timing and re-start of XLatimer PharmD Clinical Pharmacist Phone: [phone]

## 2022-10-23 NOTE — Hospital Course (Signed)
57 y.o. female with medical history significant for severe morbid obesity, persistent atrial fibrillation on Xarelto, OSA, type 2 diabetes, hypertension, who presented to [facility] ED with complaints of severe left-sided headache associated with left arm and left leg numbness.  No facial drooping.  No change in vision.  Endorses upper respiratory symptoms for the past few days.   In the ED, CT head showing age-indeterminate strokes.  MRI of the brain revealed abnormal sulcal signal on FLAIR images in the left hemisphere raising concern for meningitis versus bleed.  Neurology was consulted.  Due to use of DOAC plan for LP was delayed.  Xarelto on hold for at least 2 days before LP can be done.  Started on heparin drip.  Started on empiric coverage for meningitis, IV vancomycin, ampicillin, Rocephin, per neurology recommendation

## 2022-10-23 NOTE — Progress Notes (Signed)
Pharmacy Antibiotic Note  [name] is a 57 y.o. female admitted on 10/22/2022 with meningitis.  Pharmacy has been consulted for Vancomycin dosing. WBC WNL. Noted bump in Scr.   Plan: Vancomycin 2000 mg IV x 1, then 1000 mg IV q24h (increase dose if Scr improves >>>Estimated AUC: 450 Ceftriaxone/Ampicillin per MD Trend WBC, temp, renal function  F/U infectious work-up Drug levels as indicated   Height: '5\' 7"'$  (170.2 cm) Weight: (!) 140.6 kg (309 lb 15.5 oz) IBW/kg (Calculated) : 61.6  Temp (24hrs), Avg:97.7 F (36.5 C), Min:97.5 F (36.4 C), Max:97.8 F (36.6 C)  Recent Labs  Lab 10/22/22 1657  WBC 5.3  CREATININE 1.90*    Estimated Creatinine Clearance: 49.8 mL/min (A) (by C-G formula based on SCr of 1.9 mg/dL (H)).    Allergies  Allergen Reactions   Acetaminophen Other (See Comments)    Seizure-like "fits" as a child   Caffeine     Tense, anxiety, increased urination   [place] [Dapagliflozin] Other (See Comments)    Hallucinations, drop in blood sugar   Lisinopril Rash    Rash with lisinopril; but fosinopril is ok per patient    Narda Bonds, PharmD, BCPS Clinical Pharmacist Phone: [phone]

## 2022-10-23 NOTE — Progress Notes (Signed)
Ms. Vanalstyne in the hospital.  Med reconciliation only Brought (4 bottles of Entresto)  49-51  2 tabs BID     Renee Ramus, [place] 11/11/2022

## 2022-10-23 NOTE — H&P (Addendum)
History and Physical  [name] ZOX:[id number] DOB: 29-Aug-1966 DOA: 10/22/2022  Referring physician: Dr. Leonette Monarch, [place]. PCP: [name], NP  Outpatient Specialists: Cardiology. Patient coming from: Home.  Chief Complaint: Headache and left-sided numbness.  HPI: [name] is a 57 y.o. female with medical history significant for severe morbid obesity, persistent atrial fibrillation on Xarelto, OSA, type 2 diabetes, hypertension, who presented to [facility] ED with complaints of severe left-sided headache associated with left arm and left leg numbness.  No facial drooping.  No change in vision.  Endorses upper respiratory symptoms for the past few days.  In the ED, CT head showing age-indeterminate strokes.  MRI of the brain revealed abnormal sulcal signal on FLAIR images in the left hemisphere raising concern for meningitis versus bleed.  Neurology was consulted.  Due to use of DOAC plan for LP was delayed.  Xarelto on hold for at least 2 days before LP can be done.  Started on heparin drip.  Started on empiric coverage for meningitis, IV vancomycin, ampicillin, Rocephin, per neurology recommendation.  COVID-19 screening test positive.  Chest x-ray ordered and pending.  TRH, hospitalist service, was asked to admit.  ED Course: Tmax 97.8.  BP 122/82, pulse 83, respiratory 25, saturation 97% on room air.  Lab studies markable for serum glucose 205, BUN 42, creatinine 1.90 with baseline creatinine of 1.1 and GFR 57.  CBC essentially unremarkable.  Review of Systems: Review of systems as noted in the HPI. All other systems reviewed and are negative.   Past Medical History:  Diagnosis Date   Acute on chronic diastolic congestive heart failure ([place]) 11/02/2013   10/03/2015, 11/13/2015, 08/03/2017   Benign essential HTN 11/28/2013   Bipolar disease, chronic ([place])    Chest pain    a. 2012 Myoview: EF 63%, no isch/infarct;  b. 04/2016 Lexiscan MV: EF 73%, no ischemia/infarct-->Low risk.   Chronic  diastolic CHF (congestive heart failure) ([place]) 07/23/2011   a. 2015 Echo: EF 55-60%, Gr2 DD;  b. 09/2015 Echo: EF 60-65%, no rwma, mod dil LA, PASP 1mHg.   Cor pulmonale (chronic) (HCC)    History of thyrotoxicosis    HTN (hypertension) 11/28/2013   Hypertensive heart disease 10/18/2013   Hypoglycemia    Insulin dependent type 2 diabetes mellitus, uncontrolled    Mediastinal adenopathy    Morbid obesity due to excess calories ([place] 02/19/2011   Morbid obesity with BMI of 50.0-59.9, adult (HCC)    OSA (obstructive sleep apnea) 03/06/2011   Persistent atrial fibrillation [place] 12/09/2017   Pulmonary HTN, moderate to severe 11/03/2013   Sinusitis, chronic 01/02/2015   SVT (supraventricular tachycardia) 12/06/2013   Uncontrolled type 2 diabetes mellitus with hyperglycemia ([facility]    Past Surgical History:  Procedure Laterality Date   CARDIOVERSION N/A 04/05/2018   Procedure: CARDIOVERSION;  Surgeon: CLelon Perla MD;  [place] [facility] [facility]  Service: Cardiovascular;  Laterality: N/A;   COLONOSCOPY WITH PROPOFOL Left 07/16/2018   Procedure: COLONOSCOPY WITH PROPOFOL;  Surgeon: KRonnette Juniper MD;  Location: [facility];  Service: Gastroenterology;  Laterality: Left;   LEFT HEART CATH AND CORONARY ANGIOGRAPHY N/A 08/04/2018   Procedure: LEFT HEART CATH AND CORONARY ANGIOGRAPHY;  Surgeon: SBelva Crome MD;  [place] [facility];  Service: Cardiovascular;  Laterality: N/A;   None     POLYPECTOMY  07/16/2018   Procedure: POLYPECTOMY;  Surgeon: KRonnette Juniper MD;  Location: [facility];  Service: Gastroenterology;;    Social History:  reports that she has never smoked. She  has never used smokeless tobacco. She reports that she does not drink alcohol and does not use drugs.   Allergies  Allergen Reactions   Acetaminophen Other (See Comments)    Seizure-like "fits" as a child   Caffeine     Tense, anxiety, increased urination   [place] [Dapagliflozin] Other (See Comments)     Hallucinations, drop in blood sugar   Lisinopril Rash    Rash with lisinopril; but fosinopril is ok per patient    Family History  Problem Relation Age of Onset   Heart failure Father    Stroke Father    Hypertension Mother    Heart disease Maternal Grandfather       Prior to Admission medications   Medication Sig Start Date End Date Taking? Authorizing Provider  Accu-Chek Softclix Lancets lancets Use up to 4 times daily Patient not taking: Reported on 10/15/2022 05/28/22   Domenic Polite, MD  amLODipine (NORVASC) 5 MG tablet Take 1 tablet (5 mg total) by mouth daily. 10/15/22   Larey Dresser, MD  Blood Glucose Monitoring Suppl (ACCU-CHEK GUIDE) w/Device KIT Use to check blood sugar three times daily. Patient not taking: Reported on 10/15/2022 05/28/22   Domenic Polite, MD  Blood Pressure Monitor DEVI Please provide patient with insurance approved blood pressure monitor I10.0 Patient not taking: Reported on 10/15/2022 07/21/20   [name], NP  carvedilol (COREG) 25 MG tablet Take 1 tablet (25 mg total) by mouth 2 (two) times daily with a meal. 06/24/22   Clegg, Amy D, NP  empagliflozin (JARDIANCE) 10 MG TABS tablet Take 1 tablet (10 mg total) by mouth daily before breakfast. 07/08/22   Milford, Maricela Bo, FNP  glipiZIDE (GLUCOTROL XL) 10 MG 24 hr tablet Take 1 tablet (10 mg total) by mouth daily with breakfast. 09/24/22   Charlott Rakes, MD  glucose blood (ACCU-CHEK GUIDE) test strip Use as directed up to 4 times daily Patient not taking: Reported on 10/15/2022 05/28/22   Domenic Polite, MD  hydrALAZINE (APRESOLINE) 25 MG tablet Take 1 tablet (25 mg total) by mouth 3 (three) times daily. 09/03/22 01/01/23  Consuelo Pandy, PA-C  hydrOXYzine (VISTARIL) 25 MG capsule Take 1 capsule (25 mg total) by mouth every 8 (eight) hours as needed. 10/09/22   [name], NP  Incontinence Supply Disposable (INCONTINENCE BRIEF LARGE) MISC Please provide patient with insurance approved  incontinence supplies/briefs 07/02/20   [name], NP  metFORMIN (GLUCOPHAGE) 1000 MG tablet Take 1 tablet (1,000 mg total) by mouth 2 (two) times daily with a meal. 09/24/22   McClung, Dionne Bucy, PA-C  Naphazoline-Pheniramine (EYE ALLERGY RELIEF OP) Place 1 drop into both eyes daily as needed (allergy). Patient not taking: Reported on 10/22/2022    [provider]  risperiDONE (RISPERDAL) 2 MG tablet Take 1 tablet (2 mg total) by mouth at bedtime. 10/09/22   Charlott Rakes, MD  rivaroxaban (XARELTO) 20 MG TABS tablet Take 1 tablet (20 mg total) by mouth daily with supper. 09/24/22 12/23/22  Argentina Donovan, PA-C  sacubitril-valsartan (ENTRESTO) 97-103 MG Take 1 tablet by mouth 2 (two) times daily. 09/03/22   Consuelo Pandy, PA-C  spironolactone (ALDACTONE) 25 MG tablet Take 1 tablet (25 mg total) by mouth daily. 09/24/22   Argentina Donovan, PA-C  torsemide (DEMADEX) 20 MG tablet Take 3 tablets daily alternating with 2 tablets daily 08/06/22   Larey Dresser, MD    Physical Exam: BP 122/82   Pulse 83  Temp 97.8 F (36.6 C) (Oral)   Resp (!) 25   Ht '5\' 7"'$  (1.702 m)   Wt (!) 140.6 kg   SpO2 97%   BMI 48.55 kg/m   General: 57 y.o. year-old female well developed well nourished in no acute distress.  Alert and oriented x3. Cardiovascular: Regular rate and rhythm with no rubs or gallops.  No thyromegaly or JVD noted.  Trace lower extremity edema. 2/4 pulses in all 4 extremities. Respiratory: Mild rales at bases.  No wheezing noted.  Poor inspiratory effort. Abdomen: Soft nontender nondistended with normal bowel sounds x4 quadrants. Muskuloskeletal: No cyanosis or clubbing.  Trace LE edema noted bilaterally Neuro: CN II-XII intact, strength, sensation, reflexes Skin: No ulcerative lesions noted or rashes Psychiatry: Judgement and insight appear normal. Mood is appropriate for condition and setting          Labs on Admission:  Basic Metabolic Panel: Recent Labs  Lab  10/22/22 1657  NA 138  K 4.4  CL 101  CO2 26  GLUCOSE 205*  BUN 42*  CREATININE 1.90*  CALCIUM 9.1   Liver Function Tests: Recent Labs  Lab 10/22/22 1657  AST 24  ALT 14  ALKPHOS 88  BILITOT 0.6  PROT 7.6  ALBUMIN 3.7   No results for input(s): "LIPASE", "AMYLASE" in the last 168 hours. No results for input(s): "AMMONIA" in the last 168 hours. CBC: Recent Labs  Lab 10/22/22 1657  WBC 5.3  NEUTROABS 3.5  HGB 13.5  HCT 41.4  MCV 89.4  PLT 252   Cardiac Enzymes: No results for input(s): "CKTOTAL", "CKMB", "CKMBINDEX", "TROPONINI" in the last 168 hours.  BNP (last 3 results) Recent Labs    07/30/22 1255 09/03/22 1428 10/15/22 1512  BNP 345.8* 843.8* 579.0*    ProBNP (last 3 results) No results for input(s): "PROBNP" in the last 8760 hours.  CBG: No results for input(s): "GLUCAP" in the last 168 hours.  Radiological Exams on Admission: MR BRAIN W CONTRAST  Result Date: 10/23/2022 CLINICAL DATA:  Initial evaluation for abnormal brain MRI, possible meningitis. EXAM: MRI HEAD WITH CONTRAST TECHNIQUE: Multiplanar, multiecho pulse sequences of the brain and surrounding structures were obtained with intravenous contrast. CONTRAST:  51m GADAVIST GADOBUTROL 1 MMOL/ML IV SOLN COMPARISON:  Prior noncontrast brain MRI from 10/22/2022. FINDINGS: Examination degraded by motion artifact. Postcontrast imaging of the brain demonstrates no abnormal enhancement. Specifically, no abnormal leptomeningeal enhancement seen within the left temporal region to correspond with previously noted signal abnormality. No other pathologic enhancement elsewhere within the brain. No new findings are identified. IMPRESSION: No abnormal intracranial enhancement to correspond with previously seen signal abnormality. Given this, as well as the relative lack of corresponding signal changes on corresponding sequences, it could be that the previously noted FLAIR signal was artifactual, as there is some  motion through this region on prior study. If the patient is able to tolerate, a repeat FLAIR sequence (preferably on the 1.5 Tesla magnet) could be performed to evaluate whether these signal changes persist. Electronically Signed   By: BJeannine BogaM.D.   On: 10/23/2022 02:17   MR BRAIN WO CONTRAST  Result Date: 10/23/2022 CLINICAL DATA:  Initial evaluation for neuro deficit, headache. EXAM: MRI HEAD WITHOUT CONTRAST TECHNIQUE: Multiplanar, multiecho pulse sequences of the brain and surrounding structures were obtained without intravenous contrast. COMPARISON:  CT from earlier the same day. FINDINGS: Brain: Examination degraded by motion artifact. Cerebral volume within normal limits. Patchy T2/FLAIR hyperintensity involving the periventricular and deep white  matter both cerebral hemispheres, most characteristic of chronic microvascular ischemic disease. Few scattered remote cortical infarcts involving the right parietal and left occipital lobes noted. No evidence for acute or subacute infarct. Gray-white matter differentiation maintained. No other areas of chronic cortical infarction. There is sulcal FLAIR signal abnormality involving the peripheral left temporal lobe (series 11, image 15). Finding is of uncertain etiology and is age indeterminate. No associated susceptibility artifact to suggest hemorrhage. Additionally, no subarachnoid hemorrhage seen at this location on prior CT. No associated parenchymal edema. No mass lesion or midline shift. No hydrocephalus or extra-axial fluid collection. Partially empty sella noted. Suprasellar region normal. Vascular: Major intracranial vascular flow voids are maintained. Skull and upper cervical spine: Craniocervical junction grossly normal. Bone marrow signal intensity grossly within normal limits. No scalp soft tissue abnormality. Sinuses/Orbits: Globes orbital soft tissues within normal limits. Scattered mucosal thickening with a few retention cyst noted  within the paranasal sinuses. Superimposed pneumatized secretions noted at the right maxillary sinus. No mastoid effusion. Other: None. IMPRESSION: 1. Sulcal FLAIR signal abnormality involving the peripheral left temporal lobe, age indeterminate and of uncertain etiology. No other evidence for subarachnoid hemorrhage at this location on this exam or prior CT. While this could be chronic in nature, possible meningitis or other inflammatory process could also conceivably have this appearance. Correlation with laboratory values and symptomatology recommended. Further evaluation with dedicated LP and CSF values recommended as warranted. 2. No other acute intracranial abnormality. 3. Underlying chronic microvascular ischemic disease with a few scattered remote cortical infarcts involving the right parietal and left occipital lobes. Electronically Signed   By: Jeannine Boga M.D.   On: 10/23/2022 00:25   CT ANGIO HEAD NECK W WO CM  Result Date: 10/22/2022 CLINICAL DATA:  Neuro deficit, acute, stroke suspected headache and left arm numbness EXAM: CT ANGIOGRAPHY HEAD AND NECK TECHNIQUE: Multidetector CT imaging of the head and neck was performed using the standard protocol during bolus administration of intravenous contrast. Multiplanar CT image reconstructions and MIPs were obtained to evaluate the vascular anatomy. Carotid stenosis measurements (when applicable) are obtained utilizing NASCET criteria, using the distal internal carotid diameter as the denominator. RADIATION DOSE REDUCTION: This exam was performed according to the departmental dose-optimization program which includes automated exposure control, adjustment of the mA and/or kV according to patient size and/or use of iterative reconstruction technique. CONTRAST:  72m OMNIPAQUE IOHEXOL 350 MG/ML SOLN COMPARISON:  Same day CT head. FINDINGS: CTA NECK FINDINGS Aortic arch: Great vessel origins are patent without significant stenosis. Right carotid  system: Tortuous. No evidence of dissection, stenosis (50% or greater), or occlusion. Left carotid system: Tortuous. No evidence of dissection, stenosis (50% or greater), or occlusion. Vertebral arteries: Codominant. No evidence of dissection, stenosis (50% or greater), or occlusion. Skeleton: No acute findings. Other neck: No acute findings. Upper chest: Approximately 4 mm pulmonary nodule in the right upper lobe. Review of the MIP images confirms the above findings CTA HEAD FINDINGS Anterior circulation: Bilateral intracranial ICAs, MCAs, and ACAs are patent without proximal hemodynamically significant stenosis. Posterior circulation: Bilateral intradural vertebral arteries, basilar artery and bilateral posterior cerebral arteries are patent without proximal hemodynamically significant stenosis. Venous sinuses: As permitted by contrast timing, patent. Review of the MIP images confirms the above findings IMPRESSION: 1. No large vessel occlusion or proximal hemodynamically significant stenosis. 2. Approximately 4 mm pulmonary nodule in the right upper lobe. No follow-up needed if patient is low-risk.This recommendation follows the consensus statement: Guidelines for Management of Incidental Pulmonary  Nodules Detected on CT Images: From the Fleischner Society 2017; Radiology 2017; [phone]. Electronically Signed   By: Margaretha Sheffield M.D.   On: 10/22/2022 19:32   CT HEAD WO CONTRAST  Result Date: 10/22/2022 CLINICAL DATA:  Neuro deficit, acute, stroke suspected EXAM: CT HEAD WITHOUT CONTRAST TECHNIQUE: Contiguous axial images were obtained from the base of the skull through the vertex without intravenous contrast. RADIATION DOSE REDUCTION: This exam was performed according to the departmental dose-optimization program which includes automated exposure control, adjustment of the mA and/or kV according to patient size and/or use of iterative reconstruction technique. COMPARISON:  CT head 12/07/2013 FINDINGS:  Brain: Interval development of age-indeterminate, likely chronic, right parietooccipital loss of gray-white matter differentiation (7:57). Interval development of an age-indeterminate, possibly acute/subacute, left occipital infarction (7:62). No evidence of large-territorial acute infarction. No parenchymal hemorrhage. No mass lesion. No extra-axial collection. No mass effect or midline shift. No hydrocephalus. Basilar cisterns are patent. Vascular: No hyperdense vessel. Skull: No acute fracture or focal lesion. Sinuses/Orbits: Partial opacification of bilateral sphenoid, right frontal, right maxillary sinuses. Mild mucosal thickening in left maxillary sinus. Otherwise remaining paranasal sinuses and mastoid air cells are clear. The orbits are unremarkable. Other: None. IMPRESSION: 1. Interval development of an age-indeterminate, possibly acute/subacute, left occipital infarction. Interval development of an age-indeterminate, likely chronic, right parietooccipital loss of gray-white matter differentiation. Consider MRI without contrast for further evaluation. 2. No acute intracranial hemorrhage. These results were called by telephone at the time of interpretation on 10/22/2022 at 7:05 pm to provider Dr. Orpah Melter, who verbally acknowledged these results. Electronically Signed   By: Iven Finn M.D.   On: 10/22/2022 19:06    EKG: I independently viewed the EKG done and my findings are as followed: Atrial fibrillation rate of 86.  Nonspecific ST-T changes.  QTc 440.  Assessment/Plan Present on Admission:  Headache  Principal Problem:   Headache  Left-sided headache and left-sided numbness Concern for meningitis Continue empiric therapy IV vancomycin, Rocephin and ampicillin. Hold off home Xarelto as recommended by neurology for anticipated LP after 2 days.  Persistent atrial fibrillation, on Xarelto Rate controlled on home carvedilol Hold off home Xarelto and start heparin drip. Monitor on  telemetry  COVID-19 viral infection, POA COVID-19 PCR positive on 10/22/2022 Due to multiple comorbidities and high risk for complication, will start 3-day course of remdesivir for mild to moderate COVID-19 viral infection Follow chest x-ray  Chronic diastolic CHF Euvolemic Hold off home diuretics due to AKI. Strict I's and O's and daily weight  Type 2 diabetes with hyperglycemia Last hemoglobin A1c 6.8 on 09/24/2022. Start insulin sliding scale.  AKI, suspect prerenal in the setting of dehydration from poor oral intake Baseline creatinine appears to be 1.1 with GFR 57. Presented with creatinine 1.90 GFR 31 Avoid nephrotoxic agents, dehydration and hypotension. Monitor urine output with strict I's and O's Repeat chemistry panel in the morning.  Severe morbid obesity BMI 48.5 Recommend weight loss outpatient with regular physical activity and healthy dieting.  OSA CPAP nightly   Critical care time: 65 minutes.  DVT prophylaxis: Heparin drip  Code Status: Full code  Family Communication: None at bedside  Disposition Plan: Admitted to [facility] progressive care unit  Consults called: Neurology  Admission status: Inpatient status.   Status is: Inpatient The patient requires at least 2 midnights for further evaluation and treatment of present condition.   Kayleen Memos MD [facility] Pager [phone]  If 7PM-7AM, please contact night-coverage [email or web address] Password [email or web address]  10/23/2022, 4:59 AM

## 2022-10-23 NOTE — ED Notes (Signed)
Took over pt care at this time.

## 2022-10-23 NOTE — Progress Notes (Signed)
  Progress Note   Patient: [name] MHD:[id number] DOB: October 04, 1965 DOA: 10/22/2022     0 DOS: the patient was seen and examined on 10/23/2022   Brief hospital course: 57 y.o. female with medical history significant for severe morbid obesity, persistent atrial fibrillation on Xarelto, OSA, type 2 diabetes, hypertension, who presented to [facility] ED with complaints of severe left-sided headache associated with left arm and left leg numbness.  No facial drooping.  No change in vision.  Endorses upper respiratory symptoms for the past few days.   In the ED, CT head showing age-indeterminate strokes.  MRI of the brain revealed abnormal sulcal signal on FLAIR images in the left hemisphere raising concern for meningitis versus bleed.  Neurology was consulted.  Due to use of DOAC plan for LP was delayed.  Xarelto on hold for at least 2 days before LP can be done.  Started on heparin drip.  Started on empiric coverage for meningitis, IV vancomycin, ampicillin, Rocephin, per neurology recommendation  Assessment and Plan: Left-sided headache and left-sided numbness Concern for meningitis Continue empiric therapy IV vancomycin, Rocephin and ampicillin. Hold off home Xarelto as recommended by neurology for anticipated LP after 2 days. Reports headache improved this AM   Persistent atrial fibrillation, on Xarelto Rate controlled on home carvedilol Hold off home Xarelto and start heparin drip. Remains stable this AM   COVID-19 viral infection, POA COVID-19 PCR positive on 10/22/2022 Due to multiple comorbidities and high risk for complication, will start 3-day course of remdesivir for mild to moderate COVID-19 viral infection Follow inflammatory markers   Chronic diastolic CHF Hold off home diuretics due to AKI. Recheck bmet in AM   Type 2 diabetes with hyperglycemia Last hemoglobin A1c 6.8 on 09/24/2022. Start insulin sliding scale.   AKI, suspect prerenal in the setting of dehydration from poor  oral intake Baseline creatinine appears to be 1.1 with GFR 57. Presented with creatinine 1.90 GFR 31 Avoid nephrotoxic agents, dehydration and hypotension. Monitor urine output with strict I's and O's Encourage PO intake for now, if Cr remains elevated, would then start fluids in AM   Severe morbid obesity BMI 48.5 Recommend weight loss outpatient with regular physical activity and healthy dieting.   OSA CPAP nightly      Subjective: States headache is better today  Physical Exam: Vitals:   10/23/22 1300 10/23/22 1414 10/23/22 1506 10/23/22 1753  BP: (!) 119/90  116/84 121/87  Pulse: (!) 101  88 85  Resp: (!) 30  (!) 22 20  Temp:  97.7 F (36.5 C) (!) 97.4 F (36.3 C) 98.1 F (36.7 C)  TempSrc:  Oral  Oral  SpO2: 97%  98% 100%  Weight:      Height:       General exam: Awake, laying in bed, in nad Respiratory system: Normal respiratory effort, no wheezing Cardiovascular system: regular rate, s1, s2 Gastrointestinal system: Soft, nondistended, positive BS Central nervous system: CN2-12 grossly intact, strength intact Extremities: Perfused, no clubbing Skin: Normal skin turgor, no notable skin lesions seen Psychiatry: Mood normal // no visual hallucinations   Data Reviewed:  Labs reviewed: Na 138, K 4.4 Cr 1.90, WBC 5.3  Family Communication: Pt in room, family not at bedside  Disposition: Status is: Inpatient Remains inpatient appropriate because: Severity of illness  Planned Discharge Destination: Home    Author: Marylu Lund, MD 10/23/2022 6:32 PM  For on call review [email or web address].

## 2022-10-23 NOTE — ED Notes (Signed)
Pt took out her IV and stated that she was "itchy all over". This RN notified MD.

## 2022-10-23 NOTE — Progress Notes (Signed)
Pharmacy Antibiotic Note  [name] is a 57 y.o. female admitted on 10/22/2022 with meningitis.  Pharmacy has been consulted for Vancomycin dosing. WBC WNL. Noted bump in Scr.   Plan: Change vancomycin to traditional dosing of '1500mg'$  IV Q24H F/u renal fxn, C&S, clinical status and trough at SS   Height: '5\' 7"'$  (170.2 cm) Weight: (!) 140.6 kg (309 lb 15.5 oz) IBW/kg (Calculated) : 61.6  Temp (24hrs), Avg:97.7 F (36.5 C), Min:97.5 F (36.4 C), Max:98.1 F (36.7 C)  Recent Labs  Lab 10/22/22 1657  WBC 5.3  CREATININE 1.90*     Estimated Creatinine Clearance: 49.8 mL/min (A) (by C-G formula based on SCr of 1.9 mg/dL (H)).    Allergies  Allergen Reactions   Acetaminophen Other (See Comments)    Seizure-like "fits" as a child   Caffeine     Tense, anxiety, increased urination   [place] [Dapagliflozin] Other (See Comments)    Hallucinations, drop in blood sugar   Lisinopril Rash    Rash with lisinopril; but fosinopril is ok per patient    Salome Arnt, PharmD, BCPS, BCEMP Clinical Pharmacist Please see AMION for all pharmacy numbers 10/23/2022 8:46 AM

## 2022-10-24 DIAGNOSIS — N179 Acute kidney failure, unspecified: Secondary | ICD-10-CM | POA: Diagnosis not present

## 2022-10-24 DIAGNOSIS — R200 Anesthesia of skin: Secondary | ICD-10-CM | POA: Diagnosis not present

## 2022-10-24 DIAGNOSIS — R519 Headache, unspecified: Secondary | ICD-10-CM | POA: Diagnosis not present

## 2022-10-24 LAB — APTT
aPTT: 73 seconds — ABNORMAL HIGH (ref 24–36)
aPTT: 99 seconds — ABNORMAL HIGH (ref 24–36)

## 2022-10-24 LAB — COMPREHENSIVE METABOLIC PANEL
ALT: 16 U/L (ref 0–44)
AST: 22 U/L (ref 15–41)
Albumin: 3.7 g/dL (ref 3.5–5.0)
Alkaline Phosphatase: 85 U/L (ref 38–126)
Anion gap: 10 (ref 5–15)
BUN: 33 mg/dL — ABNORMAL HIGH (ref 6–20)
CO2: 23 mmol/L (ref 22–32)
Calcium: 9.2 mg/dL (ref 8.9–10.3)
Chloride: 103 mmol/L (ref 98–111)
Creatinine, Ser: 1.39 mg/dL — ABNORMAL HIGH (ref 0.44–1.00)
GFR, Estimated: 45 mL/min — ABNORMAL LOW (ref >60)
Glucose, Bld: 84 mg/dL (ref 70–99)
Potassium: 4.2 mmol/L (ref 3.5–5.1)
Sodium: 136 mmol/L (ref 135–145)
Total Bilirubin: 0.9 mg/dL (ref 0.3–1.2)
Total Protein: 7.7 g/dL (ref 6.5–8.1)

## 2022-10-24 LAB — CBC
HCT: 40.1 % (ref 36.0–46.0)
Hemoglobin: 13.2 g/dL (ref 12.0–15.0)
MCH: 29 pg (ref 26.0–34.0)
MCHC: 32.9 g/dL (ref 30.0–36.0)
MCV: 88.1 fL (ref 80.0–100.0)
Platelets: 221 10*3/uL (ref 150–400)
RBC: 4.55 MIL/uL (ref 3.87–5.11)
RDW: 13.6 % (ref 11.5–15.5)
WBC: 4.3 10*3/uL (ref 4.0–10.5)
nRBC: 0 % (ref 0.0–0.2)

## 2022-10-24 LAB — C-REACTIVE PROTEIN: CRP: 1 mg/dL — ABNORMAL HIGH (ref <1.0)

## 2022-10-24 LAB — HEPARIN LEVEL (UNFRACTIONATED)
Heparin Unfractionated: 0.7 IU/mL (ref 0.30–0.70)
Heparin Unfractionated: 0.58 IU/mL (ref 0.30–0.70)

## 2022-10-24 LAB — D-DIMER, QUANTITATIVE: D-Dimer, Quant: <0.27 ug/mL-FEU (ref 0.00–0.50)

## 2022-10-24 LAB — GLUCOSE, CAPILLARY
Glucose-Capillary: 101 mg/dL — ABNORMAL HIGH (ref 70–99)
Glucose-Capillary: 163 mg/dL — ABNORMAL HIGH (ref 70–99)
Glucose-Capillary: 97 mg/dL (ref 70–99)
Glucose-Capillary: 164 mg/dL — ABNORMAL HIGH (ref 70–99)

## 2022-10-24 LAB — FERRITIN: Ferritin: 87 ng/mL (ref 11–307)

## 2022-10-24 NOTE — Progress Notes (Signed)
ANTICOAGULATION CONSULT NOTE - Follow Up Consult  Pharmacy Consult for Heparin (holding Xarelto) Indication: atrial fibrillation  Allergies  Allergen Reactions   Acetaminophen Other (See Comments)    Seizure-like "fits" as a child   Caffeine     Tense, anxiety, increased urination   [place] [Dapagliflozin] Other (See Comments)    Hallucinations, drop in blood sugar   Pork-Derived Products Other (See Comments)    Religious reasons. Patient verified on 1/26 okay to get heparin products if necessary   Lisinopril Rash    Rash with lisinopril; but fosinopril is ok per patient    Patient Measurements: Height: '5\' 7"'$  (170.2 cm) Weight: (!) 145.5 kg (320 lb 12.3 oz) IBW/kg (Calculated) : 61.6 Heparin Dosing Weight: 96.1 kg  Vital Signs: Temp: 98.7 F (37.1 C) (01/27 1235) Temp Source: Oral (01/27 1235) BP: 107/77 (01/27 1235) Pulse Rate: 88 (01/27 1235)  Labs: Recent Labs    10/22/22 1657 10/23/22 2016 10/24/22 0420 10/24/22 1156  HGB 13.5  --  13.2  --   HCT 41.4  --  40.1  --   PLT 252  --  221  --   APTT 35 48* 73* 99*  LABPROT 18.7*  --   --   --   INR 1.6*  --   --   --   HEPARINUNFRC  --  0.41 0.70 0.58  CREATININE 1.90*  --  1.39*  --     Estimated Creatinine Clearance: 69.5 mL/min (A) (by C-G formula based on SCr of 1.39 mg/dL (H)).   Medications:  Scheduled:   carvedilol  3.125 mg Oral BID WC   insulin aspart  0-15 Units Subcutaneous TID WC   insulin aspart  0-5 Units Subcutaneous QHS   sodium chloride flush  3 mL Intravenous Once   Infusions:   ampicillin (OMNIPEN) IV 2 g (10/24/22 1308)   cefTRIAXone (ROCEPHIN)  IV 2 g (10/24/22 0839)   heparin 1,550 Units/hr (10/24/22 0844)   remdesivir 100 mg in sodium chloride 0.9 % 100 mL IVPB 100 mg (10/24/22 1040)   vancomycin 1,500 mg (10/24/22 6063)    Assessment: 82 yoF on Xarelto PTA for afib, holding Xarelto and starting heparin drip in anticipation of LP per neurology recommendation. Per pt, last dose  of Xarelto was 1/24 '@1800'$ . Pharmacy consulted for IV heparin dosing.  ~6 hr aPTT is therapeutic at 99, and heparin level is 0.58, on 1550 units/hr. CBC stable. No line issues or signs/symptoms of bleeding noted per RN.  Goal of Therapy:  Heparin level 0.3-0.7 units/ml aPTT 66-102 seconds Monitor platelets by anticoagulation protocol: Yes   Plan:  Continue IV heparin at 1550 units/hr. Check aPTT and heparin level with AM labs, will follow aPTT until correlating. Daily CBC and monitor for signs/symptoms of bleeding.   Vance Peper, PharmD PGY-2 Pharmacy Resident Phone [phone] 10/24/2022 1:20 PM   Please check AMION for all [place] phone numbers After 10:00 PM, call [place] [phone]

## 2022-10-24 NOTE — Progress Notes (Signed)
No complaints of headache overnight. Sensation on the left side describe as "feels wet" by patient. No active bleeding noted. Heparin drip at 15.5 ml/hr.

## 2022-10-24 NOTE — Progress Notes (Signed)
Patient refused CPAP.

## 2022-10-24 NOTE — Progress Notes (Signed)
[place] for Heparin (holding Xarelto) Indication: atrial fibrillation  Allergies  Allergen Reactions   Acetaminophen Other (See Comments)    Seizure-like "fits" as a child   Caffeine     Tense, anxiety, increased urination   [place] [Dapagliflozin] Other (See Comments)    Hallucinations, drop in blood sugar   Pork-Derived Products Other (See Comments)    Religious reasons. Patient verified on 1/26 okay to get heparin products if necessary   Lisinopril Rash    Rash with lisinopril; but fosinopril is ok per patient    Patient Measurements: Height: '5\' 7"'$  (170.2 cm) Weight: (!) 140.6 kg (309 lb 15.5 oz) IBW/kg (Calculated) : 61.6 Heparin dosing weight 96kg  Vital Signs: Temp: 98.6 F (37 C) (01/27 0317) Temp Source: Oral (01/27 0317) BP: 132/89 (01/27 0317) Pulse Rate: 94 (01/27 0317)  Labs: Recent Labs    10/22/22 1657 10/23/22 2016 10/24/22 0420  HGB 13.5  --  13.2  HCT 41.4  --  40.1  PLT 252  --  221  APTT 35 48* 73*  LABPROT 18.7*  --   --   INR 1.6*  --   --   HEPARINUNFRC  --  0.41 0.70  CREATININE 1.90*  --   --      Estimated Creatinine Clearance: 49.8 mL/min (A) (by C-G formula based on SCr of 1.9 mg/dL (H)).   Medical History: Past Medical History:  Diagnosis Date   Acute on chronic diastolic congestive heart failure ([place]) 11/02/2013   10/03/2015, 11/13/2015, 08/03/2017   Benign essential HTN 11/28/2013   Bipolar disease, chronic ([place])    Chest pain    a. 2012 Myoview: EF 63%, no isch/infarct;  b. 04/2016 Lexiscan MV: EF 73%, no ischemia/infarct-->Low risk.   Chronic diastolic CHF (congestive heart failure) ([place]) 07/23/2011   a. 2015 Echo: EF 55-60%, Gr2 DD;  b. 09/2015 Echo: EF 60-65%, no rwma, mod dil LA, PASP 24mHg.   Cor pulmonale (chronic) (H[facility])    History of thyrotoxicosis    HTN (hypertension) 11/28/2013   Hypertensive heart disease 10/18/2013   Hypoglycemia    Insulin dependent type 2 diabetes  mellitus, uncontrolled    Mediastinal adenopathy    Morbid obesity due to excess calories ([place] 02/19/2011   Morbid obesity with BMI of 50.0-59.9, adult (H[facility])    OSA (obstructive sleep apnea) 03/06/2011   Persistent atrial fibrillation ([facility] 12/09/2017   Pulmonary HTN, moderate to severe 11/03/2013   Sinusitis, chronic 01/02/2015   SVT (supraventricular tachycardia) 12/06/2013   Uncontrolled type 2 diabetes mellitus with hyperglycemia ([facility]     Assessment: 57y/o F on Xarelto PTA for afib, holding Xarelto and starting heparin drip in anticipation of LP per neurology recommendation. Per pt, last dose of Xarelto was 1/24 '@1800'$ .  Verified with patient on 1/26: okay to receive heparin (pork-derived product) despite religious status and allergy documented.  1/27 AM update:  aPTT is therapeutic   Goal of Therapy:  Heparin level 0.3-0.7 units/ml aPTT 66-102 seconds Monitor platelets by anticoagulation protocol: Yes   Plan:  Cont heparin 1550 units/hr 1200 aPTT and heparin level F/U LP timing and re-start of [place] PharmD, BHalfwayPharmacist Phone: [phone]

## 2022-10-24 NOTE — Progress Notes (Signed)
  Progress Note   Patient: [name] TMH:[id number] DOB: 11/16/1965 DOA: 10/22/2022     1 DOS: the patient was seen and examined on 10/24/2022   Brief hospital course: 57 y.o. female with medical history significant for severe morbid obesity, persistent atrial fibrillation on Xarelto, OSA, type 2 diabetes, hypertension, who presented to [facility] ED with complaints of severe left-sided headache associated with left arm and left leg numbness.  No facial drooping.  No change in vision.  Endorses upper respiratory symptoms for the past few days.   In the ED, CT head showing age-indeterminate strokes.  MRI of the brain revealed abnormal sulcal signal on FLAIR images in the left hemisphere raising concern for meningitis versus bleed.  Neurology was consulted.  Due to use of DOAC plan for LP was delayed.  Xarelto on hold for at least 2 days before LP can be done.  Started on heparin drip.  Started on empiric coverage for meningitis, IV vancomycin, ampicillin, Rocephin, per neurology recommendation  Assessment and Plan: Left-sided headache and left-sided numbness Concern for meningitis Continue empiric therapy IV vancomycin, Rocephin and ampicillin. Hold off home Xarelto as recommended by neurology for anticipated LP after 1 more day States headache now improved. No photophobia or phonophobia   Persistent atrial fibrillation, on Xarelto Rate controlled on home carvedilol Hold off home Xarelto and start heparin drip. Remains stable this AM   COVID-19 viral infection, POA COVID-19 PCR positive on 10/22/2022 Due to multiple comorbidities and high risk for complication, will start 3-day course of remdesivir for mild to moderate COVID-19 viral infection Follow inflammatory markers   Chronic diastolic CHF Hold off home diuretics due to AKI. Recheck bmet in AM   Type 2 diabetes with hyperglycemia Last hemoglobin A1c 6.8 on 09/24/2022. Start insulin sliding scale.   AKI, suspect prerenal in the setting  of dehydration from poor oral intake Baseline creatinine appears to be 1.1 with GFR 57. Presented with creatinine 1.90 GFR 31 Avoid nephrotoxic agents, dehydration and hypotension. Cr down to 1.39 this AM   Severe morbid obesity BMI 48.5 Recommend weight loss outpatient with regular physical activity and healthy dieting.   OSA CPAP nightly      Subjective: Reports headache improved. Very eager to go home soon  Physical Exam: Vitals:   10/24/22 0317 10/24/22 0500 10/24/22 0800 10/24/22 1235  BP: 132/89  106/81 107/77  Pulse: 94  94 88  Resp: (!) '21  14 15  '$ Temp: 98.6 F (37 C)  99 F (37.2 C) 98.7 F (37.1 C)  TempSrc: Oral  Oral Oral  SpO2: 98%  98% 100%  Weight:  (!) 145.5 kg    Height:       General exam: Conversant, in no acute distress Respiratory system: normal chest rise, clear, no audible wheezing Cardiovascular system: regular rhythm, s1-s2 Gastrointestinal system: Nondistended, nontender, pos BS Central nervous system: No seizures, no tremors Extremities: No cyanosis, no joint deformities Skin: No rashes, no pallor Psychiatry: Affect normal // no auditory hallucinations   Data Reviewed:  Labs reviewed: Na 136, K 4.2, Cr 1.39, Hgb 13.2, CRP 1.0  Family Communication: Pt in room, family not at bedside  Disposition: Status is: Inpatient Remains inpatient appropriate because: Severity of illness  Planned Discharge Destination: Home    Author: Marylu Lund, MD 10/24/2022 3:04 PM  For on call review [email or web address].

## 2022-10-24 NOTE — Plan of Care (Signed)
  Problem: Education: Goal: Knowledge of risk factors and measures for prevention of condition will improve Outcome: Progressing   Problem: Coping: Goal: Psychosocial and spiritual needs will be supported Outcome: Progressing   Problem: Respiratory: Goal: Will maintain a patent airway Outcome: Progressing Goal: Complications related to the disease process, condition or treatment will be avoided or minimized Outcome: Progressing   Problem: Education: Goal: Ability to describe self-care measures that may prevent or decrease complications (Diabetes Survival Skills Education) will improve Outcome: Progressing Goal: Individualized Educational Video(s) Outcome: Progressing   Problem: Coping: Goal: Ability to adjust to condition or change in health will improve Outcome: Progressing   Problem: Fluid Volume: Goal: Ability to maintain a balanced intake and output will improve Outcome: Progressing   Problem: Health Behavior/Discharge Planning: Goal: Ability to identify and utilize available resources and services will improve Outcome: Progressing Goal: Ability to manage health-related needs will improve Outcome: Progressing   Problem: Metabolic: Goal: Ability to maintain appropriate glucose levels will improve Outcome: Progressing   Problem: Nutritional: Goal: Maintenance of adequate nutrition will improve Outcome: Progressing Goal: Progress toward achieving an optimal weight will improve Outcome: Progressing   Problem: Skin Integrity: Goal: Risk for impaired skin integrity will decrease Outcome: Progressing   Problem: Tissue Perfusion: Goal: Adequacy of tissue perfusion will improve Outcome: Progressing   Problem: Education: Goal: Knowledge of General Education information will improve Description: Including pain rating scale, medication(s)/side effects and non-pharmacologic comfort measures Outcome: Progressing   Problem: Health Behavior/Discharge Planning: Goal:  Ability to manage health-related needs will improve Outcome: Progressing   Problem: Clinical Measurements: Goal: Ability to maintain clinical measurements within normal limits will improve Outcome: Progressing Goal: Will remain free from infection Outcome: Progressing Goal: Diagnostic test results will improve Outcome: Progressing Goal: Respiratory complications will improve Outcome: Progressing Goal: Cardiovascular complication will be avoided Outcome: Progressing   Problem: Activity: Goal: Risk for activity intolerance will decrease Outcome: Progressing   Problem: Nutrition: Goal: Adequate nutrition will be maintained Outcome: Progressing   Problem: Coping: Goal: Level of anxiety will decrease Outcome: Progressing   Problem: Elimination: Goal: Will not experience complications related to bowel motility Outcome: Progressing Goal: Will not experience complications related to urinary retention Outcome: Progressing   Problem: Pain Managment: Goal: General experience of comfort will improve Outcome: Progressing   Problem: Safety: Goal: Ability to remain free from injury will improve Outcome: Progressing   Problem: Skin Integrity: Goal: Risk for impaired skin integrity will decrease Outcome: Progressing

## 2022-10-25 ENCOUNTER — Encounter (HOSPITAL_COMMUNITY): Payer: Self-pay | Admitting: Internal Medicine

## 2022-10-25 DIAGNOSIS — N179 Acute kidney failure, unspecified: Secondary | ICD-10-CM | POA: Diagnosis not present

## 2022-10-25 DIAGNOSIS — G039 Meningitis, unspecified: Secondary | ICD-10-CM

## 2022-10-25 DIAGNOSIS — R200 Anesthesia of skin: Secondary | ICD-10-CM | POA: Diagnosis not present

## 2022-10-25 DIAGNOSIS — R519 Headache, unspecified: Secondary | ICD-10-CM | POA: Diagnosis not present

## 2022-10-25 LAB — GLUCOSE, CAPILLARY
Glucose-Capillary: 131 mg/dL — ABNORMAL HIGH (ref 70–99)
Glucose-Capillary: 127 mg/dL — ABNORMAL HIGH (ref 70–99)
Glucose-Capillary: 106 mg/dL — ABNORMAL HIGH (ref 70–99)
Glucose-Capillary: 150 mg/dL — ABNORMAL HIGH (ref 70–99)

## 2022-10-25 LAB — D-DIMER, QUANTITATIVE: D-Dimer, Quant: <0.27 ug/mL-FEU (ref 0.00–0.50)

## 2022-10-25 LAB — C-REACTIVE PROTEIN: CRP: 2.9 mg/dL — ABNORMAL HIGH (ref <1.0)

## 2022-10-25 LAB — FERRITIN: Ferritin: 87 ng/mL (ref 11–307)

## 2022-10-25 LAB — APTT: aPTT: 37 seconds — ABNORMAL HIGH (ref 24–36)

## 2022-10-25 LAB — HEPARIN LEVEL (UNFRACTIONATED): Heparin Unfractionated: 0.59 IU/mL (ref 0.30–0.70)

## 2022-10-25 MED ORDER — GLUCERNA SHAKE PO LIQD
237.0000 mL | Freq: Three times a day (TID) | ORAL | Status: DC
  Administered 2022-10-25 – 2022-10-28 (×9): 237 mL via ORAL

## 2022-10-25 MED ORDER — HEPARIN (PORCINE) 25000 UT/250ML-% IV SOLN
1550.0000 [IU]/h | INTRAVENOUS | Status: DC
  Administered 2022-10-25: 1550 [IU]/h via INTRAVENOUS
  Filled 2022-10-25: qty 250

## 2022-10-25 MED ORDER — RIVAROXABAN 20 MG PO TABS
20.0000 mg | ORAL_TABLET | Freq: Every day | ORAL | Status: DC
  Filled 2022-10-25: qty 1

## 2022-10-25 NOTE — Progress Notes (Signed)
  Progress Note   Patient: [name] MBW:[id number] DOB: 11-Jun-1966 DOA: 10/22/2022     2 DOS: the patient was seen and examined on 10/25/2022   Brief hospital course: 57 y.o. female with medical history significant for severe morbid obesity, persistent atrial fibrillation on Xarelto, OSA, type 2 diabetes, hypertension, who presented to [facility] ED with complaints of severe left-sided headache associated with left arm and left leg numbness.  No facial drooping.  No change in vision.  Endorses upper respiratory symptoms for the past few days.   In the ED, CT head showing age-indeterminate strokes.  MRI of the brain revealed abnormal sulcal signal on FLAIR images in the left hemisphere raising concern for meningitis versus bleed.  Neurology was consulted.  Due to use of DOAC plan for LP was delayed.  Xarelto on hold for at least 2 days before LP can be done.  Started on heparin drip.  Started on empiric coverage for meningitis, IV vancomycin, ampicillin, Rocephin, per neurology recommendation  Assessment and Plan: Left-sided headache and left-sided numbness Concern for meningitis Continue empiric therapy IV vancomycin, Rocephin and ampicillin. Continue to hold xarelto States headache now improved. No photophobia or phonophobia Declined LP this AM. However, later, patient agree to procedure.  Discussed with neurology. Given patient's obesity, recommendation for LP to be performed by IR Will cont heparin gtt and hold xarelto for now   Persistent atrial fibrillation, on Xarelto Rate controlled on home carvedilol Hold off home Xarelto and start heparin drip. Remains stable this AM   COVID-19 viral infection, POA COVID-19 PCR positive on 10/22/2022 Due to multiple comorbidities and high risk for complication, given course of remdisivir Follow inflammatory markers   Chronic diastolic CHF Hold off home diuretics due to AKI. Recheck bmet in AM   Type 2 diabetes with hyperglycemia Last hemoglobin  A1c 6.8 on 09/24/2022. Start insulin sliding scale.   AKI, suspect prerenal in the setting of dehydration from poor oral intake Baseline creatinine appears to be 1.1 with GFR 57. Presented with creatinine 1.90 GFR 31 Avoid nephrotoxic agents, dehydration and hypotension. Cr down to 1.39    Severe morbid obesity BMI 48.5 Recommend weight loss outpatient with regular physical activity and healthy dieting.   OSA CPAP nightly      Subjective: Hoping to go home soon. Initially declined LP, however later in the day, agreed to procedure  Physical Exam: Vitals:   10/25/22 0500 10/25/22 0830 10/25/22 1131 10/25/22 1531  BP:  (!) 134/90 123/87 (!) 142/86  Pulse:  89 83 86  Resp:  '17 16 20  '$ Temp:  98.8 F (37.1 C) 98.6 F (37 C) 98.4 F (36.9 C)  TempSrc:  Oral Oral Oral  SpO2:  95% 96%   Weight: (!) 147.5 kg     Height:       General exam: Awake, laying in bed, in nad Respiratory system: Normal respiratory effort, no wheezing Cardiovascular system: regular rate, s1, s2 Gastrointestinal system: Soft, nondistended, positive BS Central nervous system: CN2-12 grossly intact, strength intact Extremities: Perfused, no clubbing Skin: Normal skin turgor, no notable skin lesions seen Psychiatry: Mood normal // no visual hallucinations   Data Reviewed:  There are no new results to review at this time.  Family Communication: Pt in room, family not at bedside  Disposition: Status is: Inpatient Remains inpatient appropriate because: Severity of illness  Planned Discharge Destination: Home    Author: Marylu Lund, MD 10/25/2022 4:11 PM  For on call review [email or web address].

## 2022-10-25 NOTE — Plan of Care (Signed)
  Problem: Education: Goal: Knowledge of risk factors and measures for prevention of condition will improve Outcome: Progressing   Problem: Coping: Goal: Psychosocial and spiritual needs will be supported Outcome: Progressing   Problem: Respiratory: Goal: Will maintain a patent airway Outcome: Progressing Goal: Complications related to the disease process, condition or treatment will be avoided or minimized Outcome: Progressing   Problem: Education: Goal: Ability to describe self-care measures that may prevent or decrease complications (Diabetes Survival Skills Education) will improve Outcome: Progressing Goal: Individualized Educational Video(s) Outcome: Progressing   Problem: Coping: Goal: Ability to adjust to condition or change in health will improve Outcome: Progressing   Problem: Fluid Volume: Goal: Ability to maintain a balanced intake and output will improve Outcome: Progressing   Problem: Health Behavior/Discharge Planning: Goal: Ability to identify and utilize available resources and services will improve Outcome: Progressing Goal: Ability to manage health-related needs will improve Outcome: Progressing   Problem: Metabolic: Goal: Ability to maintain appropriate glucose levels will improve Outcome: Progressing   Problem: Nutritional: Goal: Maintenance of adequate nutrition will improve Outcome: Progressing Goal: Progress toward achieving an optimal weight will improve Outcome: Progressing   Problem: Skin Integrity: Goal: Risk for impaired skin integrity will decrease Outcome: Progressing   Problem: Tissue Perfusion: Goal: Adequacy of tissue perfusion will improve Outcome: Progressing   Problem: Education: Goal: Knowledge of General Education information will improve Description: Including pain rating scale, medication(s)/side effects and non-pharmacologic comfort measures Outcome: Progressing   Problem: Health Behavior/Discharge Planning: Goal:  Ability to manage health-related needs will improve Outcome: Progressing   Problem: Clinical Measurements: Goal: Ability to maintain clinical measurements within normal limits will improve Outcome: Progressing Goal: Will remain free from infection Outcome: Progressing Goal: Diagnostic test results will improve Outcome: Progressing Goal: Respiratory complications will improve Outcome: Progressing Goal: Cardiovascular complication will be avoided Outcome: Progressing   Problem: Activity: Goal: Risk for activity intolerance will decrease Outcome: Progressing   Problem: Nutrition: Goal: Adequate nutrition will be maintained Outcome: Progressing   Problem: Coping: Goal: Level of anxiety will decrease Outcome: Progressing   Problem: Elimination: Goal: Will not experience complications related to bowel motility Outcome: Progressing Goal: Will not experience complications related to urinary retention Outcome: Progressing   Problem: Pain Managment: Goal: General experience of comfort will improve Outcome: Progressing   Problem: Safety: Goal: Ability to remain free from injury will improve Outcome: Progressing   Problem: Skin Integrity: Goal: Risk for impaired skin integrity will decrease Outcome: Progressing

## 2022-10-25 NOTE — Progress Notes (Signed)
[place] for Heparin (holding Xarelto) Indication: atrial fibrillation  Allergies  Allergen Reactions   Acetaminophen Other (See Comments)    Seizure-like "fits" as a child   Caffeine     Tense, anxiety, increased urination   [place] [Dapagliflozin] Other (See Comments)    Hallucinations, drop in blood sugar   Pork-Derived Products Other (See Comments)    Religious reasons. Patient verified on 1/26 okay to get heparin products if necessary   Lisinopril Rash    Rash with lisinopril; but fosinopril is ok per patient    Patient Measurements: Height: '5\' 7"'$  (170.2 cm) Weight: (!) 147.5 kg (325 lb 2.9 oz) IBW/kg (Calculated) : 61.6  Heparin Dosing Weight: 96 kg  Vital Signs: Temp: 98.4 F (36.9 C) (01/28 1531) Temp Source: Oral (01/28 1531) BP: 142/86 (01/28 1531) Pulse Rate: 86 (01/28 1531)  Labs: Recent Labs    10/24/22 0420 10/24/22 1156 10/25/22 0323  HGB 13.2  --   --   HCT 40.1  --   --   PLT 221  --   --   APTT 73* 99* 37*  HEPARINUNFRC 0.70 0.58 0.59  CREATININE 1.39*  --   --     Estimated Creatinine Clearance: 70.1 mL/min (A) (by C-G formula based on SCr of 1.39 mg/dL (H)).   Assessment: 80 yoF on Xarelto PTA for afib, holding Xarelto and starting heparin drip in anticipation of LP per neurology recommendation. Per pt, last dose of Xarelto was 1/24 '@1800'$ . Pharmacy consulted for IV heparin dosing.  CBC stable. No line issues or signs/symptoms of bleeding noted per RN. Heparin infusion initially held this morning for pending LP procedure but patient declined. LP now rescheduled for 1/29. Per discussion with RN, Xarelto was previously ordered but not given. Will resume heparin infusion at previous rate and plan to hold 6 hours before procedure.   Goal of Therapy:  Heparin level 0.3-0.7 units/ml aPTT 66-102 seconds Monitor platelets by anticoagulation protocol: Yes   Plan:  Restart heparin infusion at 1550  units/hr Check heparin level in 6 hours and daily while on heparin Continue to monitor H&H and platelets    Thank you for allowing pharmacy to be a part of this patient's care.  Ardyth Harps, PharmD Clinical Pharmacist

## 2022-10-25 NOTE — Progress Notes (Signed)
Pt states she has CPAP at home, has not worn in awhile, does not want to wear one here

## 2022-10-25 NOTE — Progress Notes (Signed)
Neurology Progress Note  Brief HPI: [name] is a 57 y.o. female with a past medical history of atrial fibrillation on Xarelto, mediastinal adenopathy, sleep apnea, uncontrolled diabetes, hypertension, who presented to the emergency room with complaints of 10/10 left-sided headache that she has never had before, no associated visual changes but numbness in the left arm and leg.  She reported that it feels like her whole left side has had cold water on it.  No facial drooping.  No visual symptoms. Additionally, she reports she has been having respiratory symptoms of cough production and sneezing as well as been feeling feverish for the past few days. Her COVID test came positive in the ER. MRI of the brain reveals abnormal sulcal signal on FLAIR images in the left hemisphere raising concern for meningitis versus bleed.  Current Abx: Vancomycin, rocephin, ampicillin  Subjective: Patient seen in room in bed. Heparin was turned off in anticipation of LP. Risks and benefits explained as well as our concerns, but she insists that she does not want to proceed with the procedure. We will recommend continuing empiric antibiotics. She states she feels 80% better. She denies headache, numbness, or weakness at this time. She states that she does feel tired. She did refuse to reposition herself or sit up in the bed during the exam.  Exam: Vitals:   10/24/22 2317 10/25/22 0355  BP: 121/67 (!) 146/94  Pulse: 88 87  Resp: (!) 21 20  Temp: 98.1 F (36.7 C) 97.6 F (36.4 C)  SpO2: 100% 100%   Gen: In bed, NAD Resp: non-labored breathing, no acute distress Abd: soft, nt  Neuro: Mental Status: AAox4. Speech is clear. Patient is able to give a clear and coherent history. No signs of aphasia or neglect Cranial Nerves: II: Visual Fields are full. PERRL.   III,IV, VI: EOMI without ptosis or diploplia.  V: Facial sensation is symmetric to temperature VII: Facial movement is symmetric resting and  smiling VIII: Hearing is intact to voice X: Palate elevates symmetrically XI: Shoulder shrug is symmetric. XII: Tongue protrudes midline without atrophy or fasciculations.  Motor: Tone is normal. Bulk is normal. 5/5 strength was present in all four extremities.  Sensory: Sensation is symmetric to light touch and temperature in the arms and legs.  Cerebellar: FNF and HKS are intact bilaterally, rapid alternating movements intact Gait: steady or deferred for safety   Pertinent Labs: CRP 2.9 Cr 1.39   Assessment: 57 y.o. female with a past medical history of atrial fibrillation on Xarelto, mediastinal adenopathy, sleep apnea, uncontrolled diabetes, supermorbid obesity and hypertension, who presented to the emergency room with complaints of 10/10 left-sided headache that she has never had before, with associated numbness in the left arm and leg.  - Exam today is nonfocal - DDx for her presentation with sulcal FLAIR signal abnormalities on MRI is broad, including carcinomatous meningitis, non-traumatic  nonaneurysmal subarachnoid hemorrhage, fungal infection, bacterial infection or viral infection. - LP:  - Patient refused LP during AM rounds today when I spoke with her. I explained the reasoning for [place] wanting to proceed with this but she insisted that she did not want it. We will recommend that she stays on empiric vancomycin, rocephin, and ampicillin. She should also have her anticoagulation resumed.  - The patient then decided to consent to LP after discussing further with Dr. Wyline Copas on Sunday evening - Will need fluoro-guidance due to supermorbid obesity and low pain tolerance - CSF labs to include cytology, cell count with diff,  viral and bacterial PCR panel, glucose, protein and IgG index - MRI brain without contrast:  Sulcal FLAIR signal abnormality involving the peripheral left temporal lobe, with no correlate on preceding CT. While this could be chronic in nature, possible meningitis or  other inflammatory process could also conceivably have this appearance. Underlying chronic microvascular ischemic disease with a few scattered remote cortical infarcts involving the right parietal and left occipital lobes.  - MRI brain with contrast: No abnormal intracranial enhancement to correspond with previously seen signal abnormality. Given this, as well as the relative lack of corresponding signal changes on corresponding sequences, it could be that the previously noted FLAIR signal was artifactual.   Recommendations: - Will be on heparin IV until 6 hours prior to planned fluoro-guided LP on Monday - Fluoro-guided LP will be ordered by Dr. Wyline Copas (discussed by telephone) - CSF labs to include cytology, cell count with diff, viral and bacterial PCR panel, glucose, protein and IgG index - Continue empiric meningitis-dose antibiotics    Janine Ores, DNP, FNP-BC [facility] Pager: [phone]  Electronically signed: Dr. Kerney Elbe

## 2022-10-26 ENCOUNTER — Other Ambulatory Visit (HOSPITAL_COMMUNITY): Payer: Self-pay

## 2022-10-26 ENCOUNTER — Inpatient Hospital Stay (HOSPITAL_COMMUNITY): Payer: Medicaid Other

## 2022-10-26 ENCOUNTER — Other Ambulatory Visit (HOSPITAL_COMMUNITY): Admission: RE | Admit: 2022-10-26 | Payer: Medicaid Other | Source: Ambulatory Visit

## 2022-10-26 DIAGNOSIS — R519 Headache, unspecified: Secondary | ICD-10-CM

## 2022-10-26 DIAGNOSIS — R200 Anesthesia of skin: Secondary | ICD-10-CM

## 2022-10-26 DIAGNOSIS — N179 Acute kidney failure, unspecified: Secondary | ICD-10-CM | POA: Diagnosis not present

## 2022-10-26 LAB — BASIC METABOLIC PANEL
Anion gap: 9 (ref 5–15)
BUN: 24 mg/dL — ABNORMAL HIGH (ref 6–20)
CO2: 22 mmol/L (ref 22–32)
Calcium: 9.2 mg/dL (ref 8.9–10.3)
Chloride: 104 mmol/L (ref 98–111)
Creatinine, Ser: 1 mg/dL (ref 0.44–1.00)
GFR, Estimated: >60 mL/min (ref >60)
Glucose, Bld: 107 mg/dL — ABNORMAL HIGH (ref 70–99)
Potassium: 4.7 mmol/L (ref 3.5–5.1)
Sodium: 135 mmol/L (ref 135–145)

## 2022-10-26 LAB — MENINGITIS/ENCEPHALITIS PANEL (CSF)

## 2022-10-26 LAB — GLUCOSE, CAPILLARY
Glucose-Capillary: 125 mg/dL — ABNORMAL HIGH (ref 70–99)
Glucose-Capillary: 136 mg/dL — ABNORMAL HIGH (ref 70–99)
Glucose-Capillary: 128 mg/dL — ABNORMAL HIGH (ref 70–99)
Glucose-Capillary: 159 mg/dL — ABNORMAL HIGH (ref 70–99)

## 2022-10-26 LAB — CSF CELL COUNT WITH DIFFERENTIAL
RBC Count, CSF: 188 /mm3 — ABNORMAL HIGH
RBC Count, CSF: 5 /mm3 — ABNORMAL HIGH
Tube #: 1
Tube #: 4
WBC, CSF: 1 /mm3 (ref 0–5)
WBC, CSF: 2 /mm3 (ref 0–5)

## 2022-10-26 LAB — CBC
HCT: 36 % (ref 36.0–46.0)
Hemoglobin: 11.6 g/dL — ABNORMAL LOW (ref 12.0–15.0)
MCH: 29.1 pg (ref 26.0–34.0)
MCHC: 32.2 g/dL (ref 30.0–36.0)
MCV: 90.2 fL (ref 80.0–100.0)
Platelets: 183 10*3/uL (ref 150–400)
RBC: 3.99 MIL/uL (ref 3.87–5.11)
RDW: 13.7 % (ref 11.5–15.5)
WBC: 3.7 10*3/uL — ABNORMAL LOW (ref 4.0–10.5)
nRBC: 0 % (ref 0.0–0.2)

## 2022-10-26 LAB — PROTEIN AND GLUCOSE, CSF
Glucose, CSF: 84 mg/dL — ABNORMAL HIGH (ref 40–70)
Total  Protein, CSF: 21 mg/dL (ref 15–45)

## 2022-10-26 LAB — FERRITIN: Ferritin: 81 ng/mL (ref 11–307)

## 2022-10-26 LAB — C-REACTIVE PROTEIN: CRP: 2.2 mg/dL — ABNORMAL HIGH (ref <1.0)

## 2022-10-26 LAB — HEPARIN LEVEL (UNFRACTIONATED)
Heparin Unfractionated: 0.34 IU/mL (ref 0.30–0.70)
Heparin Unfractionated: 0.36 IU/mL (ref 0.30–0.70)

## 2022-10-26 LAB — D-DIMER, QUANTITATIVE: D-Dimer, Quant: <0.27 ug/mL-FEU (ref 0.00–0.50)

## 2022-10-26 MED ORDER — IBUPROFEN 200 MG PO TABS: 400.0000 mg | ORAL_TABLET | ORAL | Status: DC | PRN

## 2022-10-26 MED ORDER — HEPARIN (PORCINE) 25000 UT/250ML-% IV SOLN
1800.0000 [IU]/h | INTRAVENOUS | Status: DC
  Administered 2022-10-26: 1550 [IU]/h via INTRAVENOUS
  Filled 2022-10-26 (×2): qty 250

## 2022-10-26 MED ORDER — LIDOCAINE HCL (PF) 1 % IJ SOLN
5.0000 mL | Freq: Once | INTRAMUSCULAR | Status: AC
  Administered 2022-10-26: 5 mL

## 2022-10-26 NOTE — Progress Notes (Signed)
[place] for Heparin (holding Xarelto) Indication: atrial fibrillation  Allergies  Allergen Reactions   Acetaminophen Other (See Comments)    Seizure-like "fits" as a child   Caffeine     Tense, anxiety, increased urination   [place] [Dapagliflozin] Other (See Comments)    Hallucinations, drop in blood sugar   Pork-Derived Products Other (See Comments)    Religious reasons. Patient verified on 1/26 okay to get heparin products if necessary   Lisinopril Rash    Rash with lisinopril; but fosinopril is ok per patient    Patient Measurements: Height: '5\' 7"'$  (170.2 cm) Weight: (!) 147.5 kg (325 lb 2.9 oz) IBW/kg (Calculated) : 61.6  Heparin Dosing Weight: 96 kg  Vital Signs: Temp: 98.3 F (36.8 C) (01/28 2353) Temp Source: Oral (01/28 2353) BP: 152/88 (01/28 2353) Pulse Rate: 82 (01/28 2353)  Labs: Recent Labs    10/24/22 0420 10/24/22 1156 10/25/22 0323 10/26/22 0012  HGB 13.2  --   --   --   HCT 40.1  --   --   --   PLT 221  --   --   --   APTT 73* 99* 37*  --   HEPARINUNFRC 0.70 0.58 0.59 0.34  CREATININE 1.39*  --   --   --      Estimated Creatinine Clearance: 70.1 mL/min (A) (by C-G formula based on SCr of 1.39 mg/dL (H)).   Assessment: 33 yoF on Xarelto PTA for afib, holding Xarelto and starting heparin drip in anticipation of LP per neurology recommendation. Per pt, last dose of Xarelto was 1/24 '@1800'$ . Pharmacy consulted for IV heparin dosing.  CBC stable. No line issues or signs/symptoms of bleeding noted per RN. Heparin infusion initially held this morning for pending LP procedure but patient declined. LP now rescheduled for 1/29. Per discussion with RN, Xarelto was previously ordered but not given. Will resume heparin infusion at previous rate and plan to hold 6 hours before procedure.   1/29 AM update:  Heparin level therapeutic   Goal of Therapy:  Heparin level 0.3-0.7 units/mL Monitor platelets by  anticoagulation protocol: Yes   Plan:  Cont heparin 1550 units/hr Heparin level with AM labs   Narda Bonds, PharmD, [place] Pharmacist Phone: [phone]

## 2022-10-26 NOTE — Procedures (Signed)
PROCEDURE SUMMARY:  Successful image-guided lumbar puncture at level of L2.  Opening pressure 30 cm water.  Closing pressure 18 cm water.  Yielded 12 mL of clear CSF.  No immediate complications.  EBL = trace. Patient tolerated well.   Specimen was sent for labs.  Heparin infusion can be resumed in 1 hour.   Please see imaging section of Epic for full dictation.   Armando Gang Martel Galvan PA-C 10/26/2022 3:59 PM

## 2022-10-26 NOTE — Progress Notes (Signed)
  Transition of Care [facility]) Screening Note   Patient Details  Name: [name] Date of Birth: [birth year]   Transition of Care [facility] CM/SW Contact:    Geralynn Ochs, LCSW Phone Number: 10/26/2022, 3:15 PM    Transition of Care Department [facility]) has reviewed patient, medical workup ongoing. We will continue to monitor patient advancement through interdisciplinary progression rounds. If new patient transition needs arise, please place a TOC consult.

## 2022-10-26 NOTE — Progress Notes (Addendum)
ANTICOAGULATION CONSULT NOTE- follow-up  Pharmacy Consult for Heparin (holding Xarelto) Indication: atrial fibrillation  Allergies  Allergen Reactions   Acetaminophen Other (See Comments)    Seizure-like "fits" as a child   Caffeine     Tense, anxiety, increased urination   [place] [Dapagliflozin] Other (See Comments)    Hallucinations, drop in blood sugar   Pork-Derived Products Other (See Comments)    Religious reasons. Patient verified on 1/26 okay to get heparin products if necessary   Lisinopril Rash    Rash with lisinopril; but fosinopril is ok per patient    Patient Measurements: Height: '5\' 7"'$  (170.2 cm) Weight: (!) 145.1 kg (319 lb 14.2 oz) IBW/kg (Calculated) : 61.6  Heparin Dosing Weight: 96 kg  Vital Signs: Temp: 98 F (36.7 C) (01/29 0802) Temp Source: Axillary (01/29 0802) BP: 131/95 (01/29 0802) Pulse Rate: 90 (01/29 0802)  Labs: Recent Labs    10/24/22 0420 10/24/22 1156 10/25/22 0323 10/26/22 0012 10/26/22 0331  HGB 13.2  --   --   --  11.6*  HCT 40.1  --   --   --  36.0  PLT 221  --   --   --  183  APTT 73* 99* 37*  --   --   HEPARINUNFRC 0.70 0.58 0.59 0.34 0.36  CREATININE 1.39*  --   --   --  1.00     Estimated Creatinine Clearance: 96.5 mL/min (by C-G formula based on SCr of 1 mg/dL).   Assessment: 54 yoF on Xarelto PTA for afib, holding Xarelto and starting heparin drip in anticipation of LP per neurology recommendation. Per pt, last dose of Xarelto was 1/24 '@1800'$ . Pharmacy consulted for IV heparin dosing.  CBC stable. No line issues or signs/symptoms of bleeding noted per RN. Heparin infusion initially held this morning for pending LP procedure but patient declined. LP now rescheduled for 1/29. Per discussion with RN, Xarelto was previously ordered but not given. Will resume heparin infusion at previous rate and plan to hold 6 hours before procedure.   1/29 AM update:  Heparin level therapeutic   Goal of Therapy:  Heparin level  0.3-0.7 units/mL Monitor platelets by anticoagulation protocol: Yes   Plan:  Heparin gtt held at 0800 in prep for the IR LP scheduled for 1315 Follow-up restart s/p procedure Heparin level with AM labs   Leniya Breit BS, PharmD, BCPS Clinical Pharmacist 10/26/2022 8:13 AM  Contact: [phone] after 3 PM  "Be curious, not judgmental..." -Jamal Maes

## 2022-10-26 NOTE — Progress Notes (Signed)
  Progress Note   Patient: [name] [id number]:[id number] DOB: 1965-11-16 DOA: 10/22/2022     3 DOS: the patient was seen and examined on 10/26/2022   Brief hospital course: 57 y.o. female with medical history significant for severe morbid obesity, persistent atrial fibrillation on Xarelto, OSA, type 2 diabetes, hypertension, who presented to [facility] ED with complaints of severe left-sided headache associated with left arm and left leg numbness.  No facial drooping.  No change in vision.  Endorses upper respiratory symptoms for the past few days.   In the ED, CT head showing age-indeterminate strokes.  MRI of the brain revealed abnormal sulcal signal on FLAIR images in the left hemisphere raising concern for meningitis versus bleed.  Neurology was consulted.  Due to use of DOAC plan for LP was delayed.  Xarelto on hold for at least 2 days before LP can be done.  Started on heparin drip.  Started on empiric coverage for meningitis, IV vancomycin, ampicillin, Rocephin, per neurology recommendation  Assessment and Plan: Left-sided headache and left-sided numbness Concern for meningitis Continue empiric therapy IV vancomycin, Rocephin and ampicillin. Continue to hold xarelto States headache now improved. No photophobia or phonophobia Initially declined LP however, later, patient agree to procedure. Pt now s/p LP 1/29 -f/u fluid analysis Neurology following   Persistent atrial fibrillation, on Xarelto Rate controlled on home carvedilol Hold off home Xarelto and would resume heparin gtt 1hr after LP per IR recs Remains stable this AM   COVID-19 viral infection, POA COVID-19 PCR positive on 10/22/2022 Due to multiple comorbidities and high risk for complication, given course of remdisivir Follow inflammatory markers   Chronic diastolic CHF Hold off home diuretics due to AKI. Recheck bmet in AM   Type 2 diabetes with hyperglycemia Last hemoglobin A1c 6.8 on 09/24/2022. Start insulin sliding  scale.   AKI, suspect prerenal in the setting of dehydration from poor oral intake Baseline creatinine appears to be 1.1 with GFR 57. Presented with creatinine 1.90 GFR 31 Avoid nephrotoxic agents, dehydration and hypotension. Cr down to 1.00   Severe morbid obesity BMI 48.5 Recommend weight loss outpatient with regular physical activity and healthy dieting.   OSA CPAP nightly      Subjective: Pt seen prior to LP. Eager to have procedure  Physical Exam: Vitals:   10/26/22 0308 10/26/22 0500 10/26/22 0548 10/26/22 0802  BP: (!) 161/103  (!) 145/82 (!) 131/95  Pulse: 67   90  Resp: 17   13  Temp: 98.3 F (36.8 C)   98 F (36.7 C)  TempSrc: Oral   Axillary  SpO2: 100%   100%  Weight:  (!) 145.1 kg    Height:       General exam: Awake, laying in bed, in nad Respiratory system: Normal respiratory effort, no wheezing Cardiovascular system: regular rate, s1, s2 Gastrointestinal system: Soft, nondistended, positive BS Central nervous system: CN2-12 grossly intact, strength intact Extremities: Perfused, no clubbing Skin: Normal skin turgor, no notable skin lesions seen Psychiatry: Mood normal // no visual hallucinations   Data Reviewed:  Labs reviewed: Na 135, K 4.7, Cr 1.00, Hgb 11.6  Family Communication: Pt in room, family not at bedside  Disposition: Status is: Inpatient Remains inpatient appropriate because: Severity of illness  Planned Discharge Destination: Home    Author: Marylu Lund, MD 10/26/2022 4:18 PM  For on call review [email or web address].

## 2022-10-27 ENCOUNTER — Inpatient Hospital Stay (HOSPITAL_COMMUNITY): Payer: Medicaid Other

## 2022-10-27 ENCOUNTER — Encounter: Payer: Self-pay | Admitting: Cardiology

## 2022-10-27 DIAGNOSIS — R519 Headache, unspecified: Secondary | ICD-10-CM | POA: Diagnosis not present

## 2022-10-27 LAB — HEPARIN LEVEL (UNFRACTIONATED)
Heparin Unfractionated: 0.12 IU/mL — ABNORMAL LOW (ref 0.30–0.70)
Heparin Unfractionated: 0.37 IU/mL (ref 0.30–0.70)

## 2022-10-27 LAB — GLUCOSE, CAPILLARY
Glucose-Capillary: 126 mg/dL — ABNORMAL HIGH (ref 70–99)
Glucose-Capillary: 131 mg/dL — ABNORMAL HIGH (ref 70–99)
Glucose-Capillary: 153 mg/dL — ABNORMAL HIGH (ref 70–99)
Glucose-Capillary: 133 mg/dL — ABNORMAL HIGH (ref 70–99)
Glucose-Capillary: 206 mg/dL — ABNORMAL HIGH (ref 70–99)

## 2022-10-27 LAB — CBC
HCT: 35.6 % — ABNORMAL LOW (ref 36.0–46.0)
Hemoglobin: 11.7 g/dL — ABNORMAL LOW (ref 12.0–15.0)
MCH: 29.5 pg (ref 26.0–34.0)
MCHC: 32.9 g/dL (ref 30.0–36.0)
MCV: 89.9 fL (ref 80.0–100.0)
Platelets: 170 10*3/uL (ref 150–400)
RBC: 3.96 MIL/uL (ref 3.87–5.11)
RDW: 13.5 % (ref 11.5–15.5)
WBC: 4 10*3/uL (ref 4.0–10.5)
nRBC: 0 % (ref 0.0–0.2)

## 2022-10-27 LAB — C-REACTIVE PROTEIN: CRP: 2.9 mg/dL — ABNORMAL HIGH (ref <1.0)

## 2022-10-27 LAB — D-DIMER, QUANTITATIVE: D-Dimer, Quant: <0.27 ug/mL-FEU (ref 0.00–0.50)

## 2022-10-27 LAB — FERRITIN: Ferritin: 81 ng/mL (ref 11–307)

## 2022-10-27 MED ORDER — LORAZEPAM 2 MG/ML IJ SOLN
2.0000 mg | Freq: Once | INTRAMUSCULAR | Status: AC | PRN
  Administered 2022-10-27: 2 mg via INTRAVENOUS
  Filled 2022-10-27: qty 1

## 2022-10-27 MED ORDER — RIVAROXABAN 20 MG PO TABS
20.0000 mg | ORAL_TABLET | Freq: Every day | ORAL | Status: DC
  Administered 2022-10-27: 20 mg via ORAL
  Filled 2022-10-27: qty 1

## 2022-10-27 MED ORDER — GADOBUTROL 1 MMOL/ML IV SOLN
10.0000 mL | Freq: Once | INTRAVENOUS | Status: AC | PRN
  Administered 2022-10-27: 10 mL via INTRAVENOUS

## 2022-10-27 MED ORDER — FUROSEMIDE 10 MG/ML IJ SOLN
40.0000 mg | Freq: Once | INTRAMUSCULAR | Status: AC
  Administered 2022-10-27: 40 mg via INTRAVENOUS
  Filled 2022-10-27: qty 4

## 2022-10-27 MED ORDER — FUROSEMIDE 10 MG/ML IJ SOLN: 40.0000 mg | INTRAMUSCULAR | Status: AC

## 2022-10-27 MED ORDER — TORSEMIDE 20 MG PO TABS
40.0000 mg | ORAL_TABLET | Freq: Every day | ORAL | Status: DC
  Administered 2022-10-28: 40 mg via ORAL
  Filled 2022-10-27: qty 2

## 2022-10-27 MED ORDER — RISPERIDONE 0.5 MG PO TABS
2.0000 mg | ORAL_TABLET | Freq: Every day | ORAL | Status: DC
  Administered 2022-10-27: 2 mg via ORAL
  Filled 2022-10-27: qty 4

## 2022-10-27 MED ORDER — SACUBITRIL-VALSARTAN 97-103 MG PO TABS
1.0000 | ORAL_TABLET | Freq: Two times a day (BID) | ORAL | Status: DC
  Administered 2022-10-27 – 2022-10-28 (×2): 1 via ORAL
  Filled 2022-10-27 (×3): qty 1

## 2022-10-27 MED ORDER — HYDRALAZINE HCL 25 MG PO TABS
25.0000 mg | ORAL_TABLET | Freq: Three times a day (TID) | ORAL | Status: DC
  Administered 2022-10-27 – 2022-10-28 (×2): 25 mg via ORAL
  Filled 2022-10-27 (×2): qty 1

## 2022-10-27 MED ORDER — FUROSEMIDE 10 MG/ML IJ SOLN: 40.0000 mg | Freq: Every day | INTRAMUSCULAR | Status: DC

## 2022-10-27 MED ORDER — IPRATROPIUM-ALBUTEROL 0.5-2.5 (3) MG/3ML IN SOLN: 3.0000 mL | RESPIRATORY_TRACT | Status: DC | PRN

## 2022-10-27 NOTE — Progress Notes (Signed)
ANTICOAGULATION CONSULT NOTE- follow-up  Pharmacy Consult for Heparin (holding Xarelto) Indication: atrial fibrillation  Allergies  Allergen Reactions   Acetaminophen Other (See Comments)    Seizure-like "fits" as a child   Caffeine     Tense, anxiety, increased urination   [place] [Dapagliflozin] Other (See Comments)    Hallucinations, drop in blood sugar   Pork-Derived Products Other (See Comments)    Religious reasons. Patient verified on 1/26 okay to get heparin products if necessary   Lisinopril Rash    Rash with lisinopril; but fosinopril is ok per patient    Patient Measurements: Height: '5\' 7"'$  (170.2 cm) Weight: (!) 145.1 kg (319 lb 14.2 oz) IBW/kg (Calculated) : 61.6  Heparin Dosing Weight: 96 kg  Vital Signs: Temp: 98 F (36.7 C) (01/30 0500) Temp Source: Oral (01/30 0500) BP: 138/74 (01/30 0500) Pulse Rate: 93 (01/29 2203)  Labs: Recent Labs    10/24/22 1156 10/25/22 0323 10/26/22 0012 10/26/22 0331 10/27/22 0628  HGB  --   --   --  11.6*  --   HCT  --   --   --  36.0  --   PLT  --   --   --  183  --   APTT 99* 37*  --   --   --   HEPARINUNFRC 0.58 0.59 0.34 0.36 0.12*  CREATININE  --   --   --  1.00  --      Estimated Creatinine Clearance: 96.5 mL/min (by C-G formula based on SCr of 1 mg/dL).   Assessment: 61 yoF on Xarelto PTA for afib, holding Xarelto and starting heparin drip in anticipation of LP per neurology recommendation. Per pt, last dose of Xarelto was 1/24 '@1800'$ . Pharmacy consulted for IV heparin dosing.  CBC stable. No line issues or signs/symptoms of bleeding noted per RN. Heparin infusion initially held this morning for pending LP procedure but patient declined. LP now rescheduled for 1/29. Per discussion with RN, Xarelto was previously ordered but not given. Will resume heparin infusion at previous rate and plan to hold 6 hours before procedure.   1/29 AM update:  Heparin level therapeutic   1/30 AM update: HL 0.12-  subtherapeutic No pauses or stops noted from nursing No signs of bleeding  Goal of Therapy:  Heparin level 0.3-0.7 units/mL Monitor platelets by anticoagulation protocol: Yes   Plan:  Increasing Heparin gtt to 1800 units/hr Heparin level in 8 hours Heparin level with AM labs   Delroy Ordway BS, PharmD, BCPS Clinical Pharmacist 10/27/2022 8:42 AM  Contact: [phone] after 3 PM  "Be curious, not judgmental..." -Jamal Maes

## 2022-10-27 NOTE — Progress Notes (Addendum)
ANTICOAGULATION CONSULT NOTE- follow-up  Pharmacy Consult for Heparin -> back to home Xarelto Indication: atrial fibrillation  Allergies  Allergen Reactions   Acetaminophen Other (See Comments)    Seizure-like "fits" as a child   Caffeine     Tense, anxiety, increased urination   [place] [Dapagliflozin] Other (See Comments)    Hallucinations, drop in blood sugar   Pork-Derived Products Other (See Comments)    Religious reasons. Patient verified on 1/26 okay to get heparin products if necessary   Lisinopril Rash    Rash with lisinopril; but fosinopril is ok per patient    Patient Measurements: Height: '5\' 7"'$  (170.2 cm) Weight: (!) 145.1 kg (319 lb 14.2 oz) IBW/kg (Calculated) : 61.6  Heparin Dosing Weight: 96 kg  Vital Signs: Temp: 99 F (37.2 C) (01/30 1552) Temp Source: Oral (01/30 1132) BP: 153/93 (01/30 1552) Pulse Rate: 84 (01/30 1552)  Labs: Recent Labs    10/25/22 0323 10/26/22 0012 10/26/22 0331 10/27/22 0628 10/27/22 1701  HGB  --   --  11.6*  --  11.7*  HCT  --   --  36.0  --  35.6*  PLT  --   --  183  --  170  APTT 37*  --   --   --   --   HEPARINUNFRC 0.59   < > 0.36 0.12* 0.37  CREATININE  --   --  1.00  --   --    < > = values in this interval not displayed.     Estimated Creatinine Clearance: 96.5 mL/min (by C-G formula based on SCr of 1 mg/dL).   Assessment: 66 yoF on Xarelto PTA for afib, holding Xarelto and starting heparin drip in anticipation of LP per neurology recommendation. Per pt, last dose of Xarelto was 1/24 '@1800'$ . Pharmacy consulted for IV heparin dosing.  CBC stable. No line issues or signs/symptoms of bleeding noted per RN. Heparin infusion initially held this morning for pending LP procedure but patient declined. LP now rescheduled for 1/29. Per discussion with RN, Xarelto was previously ordered but not given. Will resume heparin infusion at previous rate and plan to hold 6 hours before procedure.   Heparin level is therapeutic  s/p rate increase to 1800 units/hr  Goal of Therapy:  Heparin level 0.3-0.7 units/mL Monitor platelets by anticoagulation protocol: Yes   Plan:  Continue heparin gtt at 1800 units/hr Daily heparin level, CBC, s/s bleeding  Bertis Ruddy, PharmD, [place] Pharmacist ED Pharmacist Phone # [phone] 10/27/2022 5:56 PM  Addendum: Now transitioning back to home Xarelto  Plan: Xarelto '20mg'$  daily D/c heparin when first dose of Xarelto given  Sherlon Handing, PharmD, BCPS Please see amion for complete clinical pharmacist phone list 10/27/2022 6:40 PM

## 2022-10-27 NOTE — Plan of Care (Signed)
  Problem: Education: Goal: Knowledge of risk factors and measures for prevention of condition will improve Outcome: Progressing   Problem: Coping: Goal: Psychosocial and spiritual needs will be supported Outcome: Progressing   Problem: Respiratory: Goal: Will maintain a patent airway Outcome: Progressing   

## 2022-10-27 NOTE — Plan of Care (Signed)
  Problem: Metabolic: Goal: Ability to maintain appropriate glucose levels will improve Outcome: Not Progressing   Problem: Nutritional: Goal: Progress toward achieving an optimal weight will improve Outcome: Not Progressing   Problem: Coping: Goal: Psychosocial and spiritual needs will be supported Outcome: Progressing   Problem: Coping: Goal: Ability to adjust to condition or change in health will improve Outcome: Progressing   Problem: Fluid Volume: Goal: Ability to maintain a balanced intake and output will improve Outcome: Progressing   Problem: Nutritional: Goal: Maintenance of adequate nutrition will improve Outcome: Progressing   Problem: Skin Integrity: Goal: Risk for impaired skin integrity will decrease Outcome: Progressing   Problem: Education: Goal: Knowledge of General Education information will improve Description: Including pain rating scale, medication(s)/side effects and non-pharmacologic comfort measures Outcome: Progressing   Problem: Health Behavior/Discharge Planning: Goal: Ability to manage health-related needs will improve Outcome: Progressing

## 2022-10-27 NOTE — Plan of Care (Signed)
  Problem: Education: Goal: Knowledge of risk factors and measures for prevention of condition will improve Outcome: Progressing   Problem: Coping: Goal: Psychosocial and spiritual needs will be supported Outcome: Progressing   Problem: Respiratory: Goal: Will maintain a patent airway Outcome: Progressing Goal: Complications related to the disease process, condition or treatment will be avoided or minimized Outcome: Progressing   Problem: Education: Goal: Ability to describe self-care measures that may prevent or decrease complications (Diabetes Survival Skills Education) will improve Outcome: Progressing Goal: Individualized Educational Video(s) Outcome: Progressing   Problem: Coping: Goal: Ability to adjust to condition or change in health will improve Outcome: Progressing   Problem: Fluid Volume: Goal: Ability to maintain a balanced intake and output will improve Outcome: Progressing   Problem: Health Behavior/Discharge Planning: Goal: Ability to identify and utilize available resources and services will improve Outcome: Progressing Goal: Ability to manage health-related needs will improve Outcome: Progressing   Problem: Metabolic: Goal: Ability to maintain appropriate glucose levels will improve Outcome: Progressing   Problem: Nutritional: Goal: Maintenance of adequate nutrition will improve Outcome: Progressing Goal: Progress toward achieving an optimal weight will improve Outcome: Progressing   Problem: Skin Integrity: Goal: Risk for impaired skin integrity will decrease Outcome: Progressing   Problem: Tissue Perfusion: Goal: Adequacy of tissue perfusion will improve Outcome: Progressing   Problem: Education: Goal: Knowledge of General Education information will improve Description: Including pain rating scale, medication(s)/side effects and non-pharmacologic comfort measures Outcome: Progressing   Problem: Health Behavior/Discharge Planning: Goal:  Ability to manage health-related needs will improve Outcome: Progressing   Problem: Clinical Measurements: Goal: Ability to maintain clinical measurements within normal limits will improve Outcome: Progressing Goal: Will remain free from infection Outcome: Progressing Goal: Diagnostic test results will improve Outcome: Progressing Goal: Respiratory complications will improve Outcome: Progressing Goal: Cardiovascular complication will be avoided Outcome: Progressing   Problem: Activity: Goal: Risk for activity intolerance will decrease Outcome: Progressing   Problem: Nutrition: Goal: Adequate nutrition will be maintained Outcome: Progressing   Problem: Coping: Goal: Level of anxiety will decrease Outcome: Progressing   Problem: Elimination: Goal: Will not experience complications related to bowel motility Outcome: Progressing Goal: Will not experience complications related to urinary retention Outcome: Progressing   Problem: Pain Managment: Goal: General experience of comfort will improve Outcome: Progressing   Problem: Safety: Goal: Ability to remain free from injury will improve Outcome: Progressing   Problem: Skin Integrity: Goal: Risk for impaired skin integrity will decrease Outcome: Progressing

## 2022-10-27 NOTE — Progress Notes (Addendum)
Progress Note   Patient: [name] DDU:[id number] DOB: [birth year] DOA: 10/22/2022     4 DOS: the patient was seen and examined on 10/27/2022   Brief hospital course: 57 y.o. female with medical history significant for severe morbid obesity, persistent atrial fibrillation on Xarelto, OSA, type 2 diabetes, hypertension, who presented to [facility] ED with complaints of severe left-sided headache associated with left arm and left leg numbness.  No facial drooping.  No change in vision.  Endorses upper respiratory symptoms for the past few days.   In the ED, CT head showing age-indeterminate strokes.  MRI of the brain revealed abnormal sulcal signal on FLAIR images in the left hemisphere raising concern for meningitis versus bleed.  Neurology was consulted.  Due to use of DOAC plan for LP was delayed.  Xarelto on hold for at least 2 days before LP can be done.  Started on heparin drip.  Started on empiric coverage for meningitis, IV vancomycin, ampicillin, Rocephin, per neurology recommendation  Assessment and Plan: Left-sided headache and left-sided numbness Concern for meningitis Was initially continued on empiric therapy IV vancomycin, Rocephin and ampicillin. Headache improved. No photophobia or phonophobia Initially declined LP however, later, patient agreed to procedure. Pt now s/p LP 1/29 -CSF studies unremarkable. Discussed with Neurology, recs to d/c empiric abx -F/u MRI brain obtained and reviewed with no T2/FLAIR abnormality, thus suspect artifact on initial MRI -Neurology has signed off   Persistent atrial fibrillation, on Xarelto Rate controlled on home carvedilol Had been on heparin gtt in anticipation for above LP Will resume home xarelto   COVID-19 viral infection, POA COVID-19 PCR positive on 10/22/2022 Due to multiple comorbidities and high risk for complication, given course of remdisivir Follow inflammatory markers   Acute on Chronic diastolic CHF -Diuretics initially held  secondary to ARF, which has resolved -Today, complains of increased sob. Pt with visible dyspnea on exam -Ordered and reviewed CXR, findings consistent with progressive pattern of pulm edema -Ordered dose of IV lasix -Ordered torsemide to begin tomorrow, although pt may need additional dose of IV lasix if still overloaded Recheck bmet in AM -Continue entresto per home regimen, hydralazine, coreg   Type 2 diabetes with hyperglycemia Last hemoglobin A1c 6.8 on 09/24/2022. Start insulin sliding scale.   AKI, suspect prerenal in the setting of dehydration from poor oral intake Baseline creatinine appears to be 1.1 with GFR 57. Presented with creatinine 1.90 GFR 31 Avoid nephrotoxic agents Cr normalzied after diuretics were held -Resume diuretics per above -Recheck BMET in AM   Severe morbid obesity BMI 48.5 Recommend weight loss outpatient with regular physical activity and healthy dieting.   OSA CPAP nightly   HTN -Cont entresto, hydralazine, coreg per home regimen -holding off on home diuretics  as pt is receiving IV lasix -holding off home norvasc for now, but may resume as bp allows    Subjective: Complaining of increased sob this AM  Physical Exam: Vitals:   10/27/22 0500 10/27/22 0900 10/27/22 1132 10/27/22 1552  BP: 138/74 (!) 143/76 (!) 142/93 (!) 153/93  Pulse:   91 84  Resp: '18 20 20 18  '$ Temp: 98 F (36.7 C) 98 F (36.7 C) 98.3 F (36.8 C) 99 F (37.2 C)  TempSrc: Oral  Oral   SpO2: 100% 96% 98% 100%  Weight:      Height:       General exam: Conversant, in no acute distress Respiratory system: increased resp effort, no audible wheezing Cardiovascular system: regular rhythm, s1-s2 Gastrointestinal  system: Nondistended, nontender, pos BS Central nervous system: No seizures, no tremors Extremities: No cyanosis, no joint deformities Skin: No rashes, no pallor Psychiatry: Affect normal // no auditory hallucinations   Data Reviewed:  There are no new  results to review at this time.  Family Communication: Pt in room, family not at bedside  Disposition: Status is: Inpatient Remains inpatient appropriate because: Severity of illness  Planned Discharge Destination: Home    Author: Marylu Lund, MD 10/27/2022 5:03 PM  For on call review [email or web address].

## 2022-10-27 NOTE — Progress Notes (Addendum)
Neurology Progress Note  Brief HPI: [name] is a 57 y.o. female with a past medical history of atrial fibrillation on Xarelto, mediastinal adenopathy, sleep apnea, uncontrolled diabetes, hypertension, who presented to the emergency room with complaints of 10/10 left-sided headache that she has never had before, no associated visual changes but numbness in the left arm and leg.  She reported that it feels like her whole left side has had cold water on it.  No facial drooping.  No visual symptoms. Additionally, she reports she has been having respiratory symptoms of cough production and sneezing as well as been feeling feverish for the past few days. Her COVID test came positive in the ER. MRI of the brain reveals abnormal sulcal signal on FLAIR images in the left hemisphere raising concern for meningitis versus bleed.   Subjective: Patient seen in room in bed.  LP results appear benign with few WBCs and no organisms seen on gram stain and culture.  Will discontinue empiric antibiotics and repeat MRI to further characterize abnormality previously seen.  Exam: Vitals:   10/26/22 2203 10/27/22 0500  BP: (!) 153/105 138/74  Pulse: 93   Resp: 20 18  Temp: 98.2 F (36.8 C) 98 F (36.7 C)  SpO2: 97% 100%   Gen: In bed, NAD Resp: non-labored breathing, no acute distress  Neuro: Mental Status: AAox4. Speech is clear. Patient is able to give a clear and coherent history. No signs of aphasia or neglect Cranial Nerves: II: Visual Fields are full. PERRL.   III,IV, VI: EOMI without ptosis or diploplia.  V: Facial sensation is symmetric to light touch VII: Facial movement is symmetric resting and smiling VIII: Hearing is intact to voice X: Phonation normal XI: Shoulder shrug is symmetric. XII: Tongue protrudes midline without atrophy or fasciculations.  Motor: Tone is normal. Bulk is normal. 5/5 strength was present in all four extremities.  Sensory: Sensation is symmetric to light touch  and temperature in the arms and legs.  Cerebellar: FNF and HKS are intact bilaterally, rapid alternating movements intact Gait: steady or deferred for safety   Pertinent Labs: CRP 2.9 Cr 1.39  LP tube 1 188 RBC 1 WBC Tube 4 5 RBC  2 WBC Protein 21 Glucose 84 No organisims seen on gram stain or cultures so far   Assessment: 57 y.o. female with a past medical history of atrial fibrillation on Xarelto, mediastinal adenopathy, sleep apnea, uncontrolled diabetes, supermorbid obesity and hypertension, who presented to the emergency room with complaints of 10/10 left-sided headache that she has never had before, with associated numbness in the left arm and leg.  - Exam today is nonfocal - DDx for her presentation with sulcal FLAIR signal abnormalities on MRI is broad, including carcinomatous meningitis, non-traumatic  nonaneurysmal subarachnoid hemorrhage, fungal infection, bacterial infection or viral infection. - LP performed under fluoro, results benign with nothing seen so far on gram stain or cultures - MRI brain without contrast:  Sulcal FLAIR signal abnormality involving the peripheral left temporal lobe, with no correlate on preceding CT. While this could be chronic in nature, possible meningitis or other inflammatory process could also conceivably have this appearance. Underlying chronic microvascular ischemic disease with a few scattered remote cortical infarcts involving the right parietal and left occipital lobes.  - MRI brain with contrast: No abnormal intracranial enhancement to correspond with previously seen signal abnormality. Given this, as well as the relative lack of corresponding signal changes on corresponding sequences, it could be that the previously noted FLAIR  signal was artifactual.   Recommendations: - Will discontinue empiric antibiotics - Will repeat MRI to further characterize abnormality    -Patient seen by NP and then by MD, MD to edit note as  needed  [place] , MSN, AGACNP-BC [facility] See Amion for schedule and pager information 10/27/2022 8:58 AM   NEUROHOSPITALIST ADDENDUM Performed a face to face diagnostic evaluation.   I have reviewed the contents of history and physical exam as documented by PA/ARNP/Resident and agree with above documentation.  I have discussed and formulated the above plan as documented. Edits to the note have been made as needed.  Impression/Key exam findings/Plan: LP is benign, no headaches. Will get repeat MRI to clarify if the earlier noted T2/FLAIr signal is truly and abnormality and not an artifact.  Donnetta Simpers, MD [facility] [phone]   If 7pm to 7am, please call on call as listed on AMION.   Update. 1:44 PM 10/27/2022 Repeat MRI Brain completed with no T2/FLAIR abnormality noted on the left. I suspect that the earlier noted T2/FLAIR abnormality on the MRI was likely artifactual.  We will signoff. Please feel free to contact us with any questions or concerns.  Discussed with Dr. Wyline Copas with the hospitalist team over secure chat.  [place] Pager Number [phone]

## 2022-10-28 ENCOUNTER — Other Ambulatory Visit: Payer: Self-pay

## 2022-10-28 LAB — VDRL, CSF: VDRL Quant, CSF: NONREACTIVE

## 2022-10-28 LAB — COMPREHENSIVE METABOLIC PANEL
ALT: 16 U/L (ref 0–44)
AST: 23 U/L (ref 15–41)
Albumin: 3 g/dL — ABNORMAL LOW (ref 3.5–5.0)
Alkaline Phosphatase: 83 U/L (ref 38–126)
Anion gap: 9 (ref 5–15)
BUN: 32 mg/dL — ABNORMAL HIGH (ref 6–20)
CO2: 19 mmol/L — ABNORMAL LOW (ref 22–32)
Calcium: 9 mg/dL (ref 8.9–10.3)
Chloride: 106 mmol/L (ref 98–111)
Creatinine, Ser: 1.08 mg/dL — ABNORMAL HIGH (ref 0.44–1.00)
GFR, Estimated: >60 mL/min (ref >60)
Glucose, Bld: 164 mg/dL — ABNORMAL HIGH (ref 70–99)
Potassium: 5 mmol/L (ref 3.5–5.1)
Sodium: 134 mmol/L — ABNORMAL LOW (ref 135–145)
Total Bilirubin: 0.6 mg/dL (ref 0.3–1.2)
Total Protein: 6.5 g/dL (ref 6.5–8.1)

## 2022-10-28 LAB — CBC
HCT: 34.5 % — ABNORMAL LOW (ref 36.0–46.0)
Hemoglobin: 10.9 g/dL — ABNORMAL LOW (ref 12.0–15.0)
MCH: 28.8 pg (ref 26.0–34.0)
MCHC: 31.6 g/dL (ref 30.0–36.0)
MCV: 91.3 fL (ref 80.0–100.0)
Platelets: 171 10*3/uL (ref 150–400)
RBC: 3.78 MIL/uL — ABNORMAL LOW (ref 3.87–5.11)
RDW: 13.7 % (ref 11.5–15.5)
WBC: 4.8 10*3/uL (ref 4.0–10.5)
nRBC: 0 % (ref 0.0–0.2)

## 2022-10-28 LAB — C-REACTIVE PROTEIN: CRP: 4.3 mg/dL — ABNORMAL HIGH (ref <1.0)

## 2022-10-28 LAB — D-DIMER, QUANTITATIVE: D-Dimer, Quant: <0.27 ug/mL-FEU (ref 0.00–0.50)

## 2022-10-28 LAB — FERRITIN: Ferritin: 83 ng/mL (ref 11–307)

## 2022-10-28 LAB — CYTOLOGY - NON PAP

## 2022-10-28 LAB — GLUCOSE, CAPILLARY: Glucose-Capillary: 166 mg/dL — ABNORMAL HIGH (ref 70–99)

## 2022-10-28 NOTE — Progress Notes (Addendum)
[facility] night cross cover note:   I was notified by RN that the patient's most recent systolic blood pressures in the 170s, with corresponding heart rates in the low 100s.  She is scheduled to receive oral hydralazine at 10 AM this morning as well as Coreg at 8 AM this morning.  No report of any associated acute symptoms at this time.  Remainder of the vital signs were stable, including oxygen saturations in the high 90s to 100% on room air.  Afebrile.  I subsequently asked the patient's RN to please administer her next scheduled dose of Coreg early.  Specifically, I conveyed that it is okay to give the next dose of her Coreg now.     Babs Bertin, DO Hospitalist

## 2022-10-28 NOTE — Discharge Summary (Signed)
Physician Discharge Summary  [name] CVU:[id number] DOB: 14-Nov-1965 DOA: 10/22/2022  PCP: [name], NP  Admit date: 10/22/2022 Discharge date: 10/28/2022 30 Day Unplanned Readmission Risk Score    Flowsheet Row ED to Hosp-Admission (Current) from 10/22/2022 in [facility]  30 Day Unplanned Readmission Risk Score (%) 15.98 Filed at 10/28/2022 0801       This score is the patient's risk of an unplanned readmission within 30 days of being discharged (0 -100%). The score is based on dignosis, age, lab data, medications, orders, and past utilization.   Low:  0-14.9   Medium: 15-21.9   High: 22-29.9   Extreme: 30 and above          Admitted From: Home Disposition: Home  Recommendations for Outpatient Follow-up:  Follow up with PCP in 1-2 weeks Please obtain BMP/CBC in one week Please follow up with your PCP on the following pending results: Unresulted Labs (From admission, onward)     Start     Ordered   10/25/22 2145  VDRL, CSF  (CSF Labs)  Once,   R        10/25/22 2144   10/25/22 2145  IgG CSF index  (IgG CSF Index, CSF + Serum panel)  Once,   R        10/25/22 2144   10/25/22 2145  Draw extra clot tube  (IgG CSF Index, CSF + Serum panel)  Once,   R       Question:  Specimen collection method  Answer:  Lab=Lab collect   10/25/22 2144   10/24/22 0500  C-reactive protein  Daily,   R     Question:  Specimen collection method  Answer:  Lab=Lab collect   10/23/22 1857   10/24/22 0500  D-dimer, quantitative  Daily,   R      10/23/22 1857   10/24/22 0500  Ferritin  Daily,   R     Question:  Specimen collection method  Answer:  Lab=Lab collect   10/23/22 [place]: None Equipment/Devices: None  Discharge Condition: Stable CODE STATUS: Full code Diet recommendation: Cardiac/low-sodium  Subjective: Seen and examined.  She feels well.  No shortness of breath or other complaint.  Denies any headache.  She feels ready for  discharge.  Brief/Interim Summary: 57 y.o. female with medical history significant for severe morbid obesity, persistent atrial fibrillation on Xarelto, OSA, type 2 diabetes, hypertension, who presented to [facility] ED with complaints of severe left-sided headache associated with left arm and left leg numbness.  No facial drooping.  No change in vision.    In the ED, CT head showing age-indeterminate strokes.  MRI of the brain revealed abnormal sulcal signal on FLAIR images in the left hemisphere raising concern for meningitis versus bleed.  Neurology was consulted.  Due to use of DOAC plan for LP was delayed.  Xarelto on hold for at least 2 days before LP can be done.  Started on heparin drip.  Started on empiric coverage for meningitis, IV vancomycin, ampicillin, Rocephin, per neurology recommendation Headache improved. No photophobia or phonophobia Initially declined LP however, later, patient agreed to procedure. Pt now s/p LP 1/29 -CSF studies unremarkable. Discussed with Neurology, recs to d/c empiric abx -F/u MRI brain obtained and reviewed with no T2/FLAIR abnormality, thus suspect artifact on initial MRI -Neurology has signed off and cleared the patient for discharge.   Persistent  atrial fibrillation, on Xarelto Rate controlled on home carvedilol resume home xarelto   COVID-19 viral infection, POA COVID-19 PCR positive on 10/22/2022 Due to multiple comorbidities and high risk for complication, given course of remdisivir.  She is asymptomatic and comfortable on room air saturating over 95%.   Acute on Chronic diastolic CHF -Diuretics initially held secondary to ARF, which has resolved, she was then thought to be having mild acute on chronic diastolic CHF, was given IV diuretics, currently saturating well on room air.  No crackles on lung exam.  Will resume home diuretics.   Type 2 diabetes with hyperglycemia Last hemoglobin A1c 6.8 on 09/24/2022. Resume home medications.   AKI, suspect  prerenal in the setting of dehydration from poor oral intake Baseline creatinine appears to be 1.1 with GFR 57. Presented with creatinine 1.90 GFR 31 Creatinine now back to baseline and normal.  Severe morbid obesity BMI 48.5 Recommend weight loss outpatient with regular physical activity and healthy dieting.   OSA CPAP nightly   HTN Blood pressure only slightly elevated.  Resume home medications.  Discharge plan was discussed with patient and/or family member and they verbalized understanding and agreed with it.  Discharge Diagnoses:  Principal Problem:   Headache Active Problems:   Left sided numbness    Discharge Instructions   Allergies as of 10/28/2022       Reactions   Acetaminophen Other (See Comments)   Seizure-like "fits" as a child   Caffeine    Tense, anxiety, increased urination   Farxiga [dapagliflozin] Other (See Comments)   Hallucinations, drop in blood sugar   Pork-derived Products Other (See Comments)   Religious reasons. Patient verified on 1/26 okay to get heparin products if necessary   Lisinopril Rash   Rash with lisinopril; but fosinopril is ok per patient        Medication List     TAKE these medications    amLODipine 5 MG tablet Commonly known as: NORVASC Take 1 tablet (5 mg total) by mouth daily.   carvedilol 25 MG tablet Commonly known as: COREG Take 1 tablet (25 mg total) by mouth 2 (two) times daily with a meal.   empagliflozin 10 MG Tabs tablet Commonly known as: Jardiance Take 1 tablet (10 mg total) by mouth daily before breakfast.   Entresto 97-103 MG Generic drug: sacubitril-valsartan Take 1 tablet by mouth 2 (two) times daily.   glipiZIDE 10 MG 24 hr tablet Commonly known as: GLUCOTROL XL Take 1 tablet (10 mg total) by mouth daily with breakfast.   hydrALAZINE 25 MG tablet Commonly known as: APRESOLINE Take 1 tablet (25 mg total) by mouth 3 (three) times daily. What changed: when to take this   hydrOXYzine 25  MG capsule Commonly known as: VISTARIL Take 1 capsule (25 mg total) by mouth every 8 (eight) hours as needed. What changed: reasons to take this   Incontinence Brief Large Misc Please provide patient with insurance approved incontinence supplies/briefs   metFORMIN 1000 MG tablet Commonly known as: GLUCOPHAGE Take 1 tablet (1,000 mg total) by mouth 2 (two) times daily with a meal.   risperiDONE 2 MG tablet Commonly known as: RISPERDAL Take 1 tablet (2 mg total) by mouth at bedtime.   spironolactone 25 MG tablet Commonly known as: ALDACTONE Take 1 tablet (25 mg total) by mouth daily.   torsemide 20 MG tablet Commonly known as: DEMADEX Take 3 tablets daily alternating with 2 tablets daily   Xarelto 20 MG Tabs tablet Generic drug:  rivaroxaban Take 1 tablet (20 mg total) by mouth daily with supper.        Follow-up Information     [name], NP Follow up in 1 week(s).   Specialty: Nurse Practitioner Contact information: [address] [place] [address] [phone]                Allergies  Allergen Reactions   Acetaminophen Other (See Comments)    Seizure-like "fits" as a child   Caffeine     Tense, anxiety, increased urination   Farxiga [Dapagliflozin] Other (See Comments)    Hallucinations, drop in blood sugar   Pork-Derived Products Other (See Comments)    Religious reasons. Patient verified on 1/26 okay to get heparin products if necessary   Lisinopril Rash    Rash with lisinopril; but fosinopril is ok per patient    Consultations: Neurology   Procedures/Studies: DG CHEST PORT 1 VIEW  Result Date: 10/27/2022 CLINICAL DATA:  CHF. EXAM: PORTABLE CHEST 1 VIEW COMPARISON:  10/23/2022 FINDINGS: The heart is mildly enlarged but appears stable. Persistent and slightly progressive pattern of pulmonary edema. No definite pleural effusions. IMPRESSION: Persistent and slightly progressive pattern of pulmonary edema. Electronically  Signed   By: Marijo Sanes M.D.   On: 10/27/2022 16:16   MR BRAIN W WO CONTRAST  Result Date: 10/27/2022 CLINICAL DATA:  Demyelinating disease.  Follow-up abnormal head MRI. EXAM: MRI HEAD WITHOUT AND WITH CONTRAST TECHNIQUE: Multiplanar, multiecho pulse sequences of the brain and surrounding structures were obtained without and with intravenous contrast. CONTRAST:  53m GADAVIST GADOBUTROL 1 MMOL/ML IV SOLN COMPARISON:  Head MRI 10/22/2022 and 10/23/2022 FINDINGS: Multiple sequences are moderately motion degraded. Brain: There is no evidence of an acute infarct, intracranial hemorrhage, mass, midline shift, or extra-axial fluid collection. Left temporal sulcal FLAIR hyperintensity on the 10/22/2022 MRI is not present today. T2 hyperintensities in the cerebral white matter bilaterally are unchanged and nonspecific but compatible with moderately age advanced chronic small vessel ischemic disease. Small chronic bilateral parieto-occipital cortical infarcts are again noted. There is mild cerebral atrophy. No abnormal enhancement is identified. A partially empty sella is again noted. Vascular: Major intracranial vascular flow voids are preserved. Skull and upper cervical spine: Unremarkable bone marrow signal. Sinuses/Orbits: Unremarkable orbits. Persistent complete opacification of the left sphenoid sinus. Mucosal thickening and mucous retention cysts in the maxillary sinuses. Mild right frontal, right sphenoid, and bilateral ethmoid sinus mucosal thickening. Trace left mastoid fluid. Other: None. IMPRESSION: 1. Motion degraded examination. 2. No acute intracranial abnormality. Left temporal sulcal FLAIR signal abnormality on the prior MRI is no longer present. 3. Age advanced chronic small vessel ischemic disease with small chronic posterior cerebral infarcts. Electronically Signed   By: ALogan BoresM.D.   On: 10/27/2022 13:39   DG FL GUIDED LUMBAR PUNCTURE  Result Date: 10/26/2022 CLINICAL DATA:  57year old  female presents with left-sided headache, left arm and left leg numbness, previous MR brain raised concern for meningitis versus bleed. Request for fluoro guided lumbar puncture. EXAM: DIAGNOSTIC LUMBAR PUNCTURE UNDER FLUOROSCOPIC GUIDANCE COMPARISON:  None Available. FLUOROSCOPY: Radiation Exposure Index (as provided by the fluoroscopic device): 4.9 mGy Kerma PROCEDURE: Informed consent was obtained from the patient prior to the procedure, including potential risks and complications of bleeding, infection, damage to adjacent structures, low yield, headache, allergy, and pain. Time-out was performed to verify correct patient and correct procedure. Allergies were reviewed. With the patient prone, the lower back was prepped with Betadine. 1%  Lidocaine was used for local anesthesia. Lumbar puncture was performed at the L2 level using a 5 inch 20 gauge needle with return of clear CSF with an opening pressure of 30 cm water. Twelve ml of CSF were obtained for laboratory studies. Closing pressure was 18 cm of water. The needle was removed, a dressing was placed. The patient tolerated the procedure well and there were no apparent complications. IMPRESSION: Technically successful lumbar puncture. This procedure was performed by Durenda Guthrie, PA-C under supervision of Nelson Chimes, M.D. Electronically Signed   By: Nelson Chimes M.D.   On: 10/26/2022 16:28   DG CHEST PORT 1 VIEW  Result Date: 10/23/2022 CLINICAL DATA:  COVID-19 infection.  [id number]. EXAM: PORTABLE CHEST 1 VIEW COMPARISON:  AP Lat chest 05/25/2022 FINDINGS: Stable moderate cardiomegaly. Central vascular prominence is again noted with interstitial and patchy hazy opacities in the perihilar and lower lung zones which could be due to edema, pneumonia or combination. Small pleural effusions are beginning to form. The upper 1/3 of the lungs remain clear. The mediastinum is normally outlined.  No acute osseous findings. IMPRESSION: Cardiomegaly with central  vascular prominence and interstitial and patchy hazy opacities in the perihilar and lower lung zones, which could be due to edema, pneumonia or combination. Clinical correlation and radiographic follow-up recommended. Electronically Signed   By: Telford Nab M.D.   On: 10/23/2022 06:04   MR BRAIN W CONTRAST  Result Date: 10/23/2022 CLINICAL DATA:  Initial evaluation for abnormal brain MRI, possible meningitis. EXAM: MRI HEAD WITH CONTRAST TECHNIQUE: Multiplanar, multiecho pulse sequences of the brain and surrounding structures were obtained with intravenous contrast. CONTRAST:  44m GADAVIST GADOBUTROL 1 MMOL/ML IV SOLN COMPARISON:  Prior noncontrast brain MRI from 10/22/2022. FINDINGS: Examination degraded by motion artifact. Postcontrast imaging of the brain demonstrates no abnormal enhancement. Specifically, no abnormal leptomeningeal enhancement seen within the left temporal region to correspond with previously noted signal abnormality. No other pathologic enhancement elsewhere within the brain. No new findings are identified. IMPRESSION: No abnormal intracranial enhancement to correspond with previously seen signal abnormality. Given this, as well as the relative lack of corresponding signal changes on corresponding sequences, it could be that the previously noted FLAIR signal was artifactual, as there is some motion through this region on prior study. If the patient is able to tolerate, a repeat FLAIR sequence (preferably on the 1.5 Tesla magnet) could be performed to evaluate whether these signal changes persist. Electronically Signed   By: BJeannine BogaM.D.   On: 10/23/2022 02:17   MR BRAIN WO CONTRAST  Result Date: 10/23/2022 CLINICAL DATA:  Initial evaluation for neuro deficit, headache. EXAM: MRI HEAD WITHOUT CONTRAST TECHNIQUE: Multiplanar, multiecho pulse sequences of the brain and surrounding structures were obtained without intravenous contrast. COMPARISON:  CT from earlier the same  day. FINDINGS: Brain: Examination degraded by motion artifact. Cerebral volume within normal limits. Patchy T2/FLAIR hyperintensity involving the periventricular and deep white matter both cerebral hemispheres, most characteristic of chronic microvascular ischemic disease. Few scattered remote cortical infarcts involving the right parietal and left occipital lobes noted. No evidence for acute or subacute infarct. Gray-white matter differentiation maintained. No other areas of chronic cortical infarction. There is sulcal FLAIR signal abnormality involving the peripheral left temporal lobe (series 11, image 15). Finding is of uncertain etiology and is age indeterminate. No associated susceptibility artifact to suggest hemorrhage. Additionally, no subarachnoid hemorrhage seen at this location on prior CT. No associated parenchymal edema. No mass lesion or midline shift. No  hydrocephalus or extra-axial fluid collection. Partially empty sella noted. Suprasellar region normal. Vascular: Major intracranial vascular flow voids are maintained. Skull and upper cervical spine: Craniocervical junction grossly normal. Bone marrow signal intensity grossly within normal limits. No scalp soft tissue abnormality. Sinuses/Orbits: Globes orbital soft tissues within normal limits. Scattered mucosal thickening with a few retention cyst noted within the paranasal sinuses. Superimposed pneumatized secretions noted at the right maxillary sinus. No mastoid effusion. Other: None. IMPRESSION: 1. Sulcal FLAIR signal abnormality involving the peripheral left temporal lobe, age indeterminate and of uncertain etiology. No other evidence for subarachnoid hemorrhage at this location on this exam or prior CT. While this could be chronic in nature, possible meningitis or other inflammatory process could also conceivably have this appearance. Correlation with laboratory values and symptomatology recommended. Further evaluation with dedicated LP and  CSF values recommended as warranted. 2. No other acute intracranial abnormality. 3. Underlying chronic microvascular ischemic disease with a few scattered remote cortical infarcts involving the right parietal and left occipital lobes. Electronically Signed   By: Jeannine Boga M.D.   On: 10/23/2022 00:25   CT ANGIO HEAD NECK W WO CM  Result Date: 10/22/2022 CLINICAL DATA:  Neuro deficit, acute, stroke suspected headache and left arm numbness EXAM: CT ANGIOGRAPHY HEAD AND NECK TECHNIQUE: Multidetector CT imaging of the head and neck was performed using the standard protocol during bolus administration of intravenous contrast. Multiplanar CT image reconstructions and MIPs were obtained to evaluate the vascular anatomy. Carotid stenosis measurements (when applicable) are obtained utilizing NASCET criteria, using the distal internal carotid diameter as the denominator. RADIATION DOSE REDUCTION: This exam was performed according to the departmental dose-optimization program which includes automated exposure control, adjustment of the mA and/or kV according to patient size and/or use of iterative reconstruction technique. CONTRAST:  61m OMNIPAQUE IOHEXOL 350 MG/ML SOLN COMPARISON:  Same day CT head. FINDINGS: CTA NECK FINDINGS Aortic arch: Great vessel origins are patent without significant stenosis. Right carotid system: Tortuous. No evidence of dissection, stenosis (50% or greater), or occlusion. Left carotid system: Tortuous. No evidence of dissection, stenosis (50% or greater), or occlusion. Vertebral arteries: Codominant. No evidence of dissection, stenosis (50% or greater), or occlusion. Skeleton: No acute findings. Other neck: No acute findings. Upper chest: Approximately 4 mm pulmonary nodule in the right upper lobe. Review of the MIP images confirms the above findings CTA HEAD FINDINGS Anterior circulation: Bilateral intracranial ICAs, MCAs, and ACAs are patent without proximal hemodynamically  significant stenosis. Posterior circulation: Bilateral intradural vertebral arteries, basilar artery and bilateral posterior cerebral arteries are patent without proximal hemodynamically significant stenosis. Venous sinuses: As permitted by contrast timing, patent. Review of the MIP images confirms the above findings IMPRESSION: 1. No large vessel occlusion or proximal hemodynamically significant stenosis. 2. Approximately 4 mm pulmonary nodule in the right upper lobe. No follow-up needed if patient is low-risk.This recommendation follows the consensus statement: Guidelines for Management of Incidental Pulmonary Nodules Detected on CT Images: From the Fleischner Society 2017; Radiology 2017; 284:228-243. Electronically Signed   By: FMargaretha SheffieldM.D.   On: 10/22/2022 19:32   CT HEAD WO CONTRAST  Result Date: 10/22/2022 CLINICAL DATA:  Neuro deficit, acute, stroke suspected EXAM: CT HEAD WITHOUT CONTRAST TECHNIQUE: Contiguous axial images were obtained from the base of the skull through the vertex without intravenous contrast. RADIATION DOSE REDUCTION: This exam was performed according to the departmental dose-optimization program which includes automated exposure control, adjustment of the mA and/or kV according to patient size and/or  use of iterative reconstruction technique. COMPARISON:  CT head 12/07/2013 FINDINGS: Brain: Interval development of age-indeterminate, likely chronic, right parietooccipital loss of gray-white matter differentiation (7:57). Interval development of an age-indeterminate, possibly acute/subacute, left occipital infarction (7:62). No evidence of large-territorial acute infarction. No parenchymal hemorrhage. No mass lesion. No extra-axial collection. No mass effect or midline shift. No hydrocephalus. Basilar cisterns are patent. Vascular: No hyperdense vessel. Skull: No acute fracture or focal lesion. Sinuses/Orbits: Partial opacification of bilateral sphenoid, right frontal, right  maxillary sinuses. Mild mucosal thickening in left maxillary sinus. Otherwise remaining paranasal sinuses and mastoid air cells are clear. The orbits are unremarkable. Other: None. IMPRESSION: 1. Interval development of an age-indeterminate, possibly acute/subacute, left occipital infarction. Interval development of an age-indeterminate, likely chronic, right parietooccipital loss of gray-white matter differentiation. Consider MRI without contrast for further evaluation. 2. No acute intracranial hemorrhage. These results were called by telephone at the time of interpretation on 10/22/2022 at 7:05 pm to provider Dr. Orpah Melter, who verbally acknowledged these results. Electronically Signed   By: Iven Finn M.D.   On: 10/22/2022 19:06     Discharge Exam: Vitals:   10/28/22 0318 10/28/22 0647  BP: (!) 177/78 (!) 159/85  Pulse: (!) 105 98  Resp: 19 20  Temp: 98.1 F (36.7 C)   SpO2: 100% 100%   Vitals:   10/27/22 2056 10/27/22 2338 10/28/22 0318 10/28/22 0647  BP: (!) 156/97 (!) 151/89 (!) 177/78 (!) 159/85  Pulse: 92 (!) 102 (!) 105 98  Resp: '19 19 19 20  '$ Temp: 98.5 F (36.9 C) 98.2 F (36.8 C) 98.1 F (36.7 C)   TempSrc: Oral Oral    SpO2:  96% 100% 100%  Weight:      Height:        General: Pt is alert, awake, not in acute distress Cardiovascular: RRR, S1/S2 +, no rubs, no gallops Respiratory: CTA bilaterally, no wheezing, no rhonchi Abdominal: Soft, NT, ND, bowel sounds + Extremities: no edema, no cyanosis    The results of significant diagnostics from this hospitalization (including imaging, microbiology, ancillary and laboratory) are listed below for reference.     Microbiology: Recent Results (from the past 240 hour(s))  Resp panel by RT-PCR (RSV, Flu A&B, Covid) Anterior Nasal Swab     Status: Abnormal   Collection Time: 10/22/22  8:32 PM   Specimen: Anterior Nasal Swab  Result Value Ref Range Status   SARS Coronavirus 2 by RT PCR POSITIVE (A) NEGATIVE Final     Comment: (NOTE) SARS-CoV-2 target nucleic acids are DETECTED.  The SARS-CoV-2 RNA is generally detectable in upper respiratory specimens during the acute phase of infection. Positive results are indicative of the presence of the identified virus, but do not rule out bacterial infection or co-infection with other pathogens not detected by the test. Clinical correlation with patient history and other diagnostic information is necessary to determine patient infection status. The expected result is Negative.  Fact Sheet for Patients: EntrepreneurPulse.com.au  Fact Sheet for Healthcare Providers: IncredibleEmployment.be  This test is not yet approved or cleared by the [place] FDA and  has been authorized for detection and/or diagnosis of SARS-CoV-2 by FDA under an Emergency Use Authorization (EUA).  This EUA will remain in effect (meaning this test can be used) for the duration of  the COVID-19 declaration under Section 564(b)(1) of the A ct, 21 U.S.C. section 360bbb-3(b)(1), unless the authorization is terminated or revoked sooner.     Influenza A by PCR NEGATIVE NEGATIVE Final  Influenza B by PCR NEGATIVE NEGATIVE Final    Comment: (NOTE) The Xpert Xpress SARS-CoV-2/FLU/RSV plus assay is intended as an aid in the diagnosis of influenza from Nasopharyngeal swab specimens and should not be used as a sole basis for treatment. Nasal washings and aspirates are unacceptable for Xpert Xpress SARS-CoV-2/FLU/RSV testing.  Fact Sheet for Patients: EntrepreneurPulse.com.au  Fact Sheet for Healthcare Providers: IncredibleEmployment.be  This test is not yet approved or cleared by the [place] FDA and has been authorized for detection and/or diagnosis of SARS-CoV-2 by FDA under an Emergency Use Authorization (EUA). This EUA will remain in effect (meaning this test can be used) for the duration of  the COVID-19 declaration under Section 564(b)(1) of the Act, 21 U.S.C. section 360bbb-3(b)(1), unless the authorization is terminated or revoked.     Resp Syncytial Virus by PCR NEGATIVE NEGATIVE Final    Comment: (NOTE) Fact Sheet for Patients: EntrepreneurPulse.com.au  Fact Sheet for Healthcare Providers: IncredibleEmployment.be  This test is not yet approved or cleared by the [place] FDA and has been authorized for detection and/or diagnosis of SARS-CoV-2 by FDA under an Emergency Use Authorization (EUA). This EUA will remain in effect (meaning this test can be used) for the duration of the COVID-19 declaration under Section 564(b)(1) of the Act, 21 U.S.C. section 360bbb-3(b)(1), unless the authorization is terminated or revoked.  Performed at [facility], [place] [address], [place], [place] [address]   Culture, fungus without smear     Status: None (Preliminary result)   Collection Time: 10/26/22  4:06 PM   Specimen: PATH Cytology CSF; Cerebrospinal Fluid  Result Value Ref Range Status   Specimen Description CSF  Final   Special Requests NONE  Final   Culture   Final    NO FUNGUS ISOLATED AFTER 1 DAY Performed at [facility], [place] [address], [place], [place] [address]    Report Status PENDING  Incomplete  Body fluid culture w Gram Stain     Status: None (Preliminary result)   Collection Time: 10/26/22  4:06 PM   Specimen: CSF; Body Fluid  Result Value Ref Range Status   Specimen Description CSF  Final   Special Requests NONE  Final   Gram Stain CYTOSPIN SMEAR NO ORGANISMS SEEN NO WBC SEEN   Final   Culture   Final    NO GROWTH < 24 HOURS Performed at [facility], [address] [place], [place] [address]    Report Status PENDING  Incomplete  CSF culture w Gram Stain     Status: None (Preliminary result)   Collection Time: 10/26/22  8:47 PM   Specimen: CSF; Cerebrospinal Fluid  Result Value Ref Range  Status   Specimen Description CSF  Final   Special Requests NONE  Final   Gram Stain NO WBC SEEN NO ORGANISMS SEEN CYTOSPIN SMEAR   Final   Culture   Final    NO GROWTH < 12 HOURS Performed at [facility], [place] [address], [place], [place] [address]    Report Status PENDING  Incomplete  Anaerobic culture w Gram Stain     Status: None (Preliminary result)   Collection Time: 10/26/22  8:51 PM   Specimen: CSF; Cerebrospinal Fluid  Result Value Ref Range Status   Specimen Description CSF  Final   Special Requests NONE  Final   Gram Stain   Final    NO WBC SEEN NO ORGANISMS SEEN CYTOSPIN SMEAR Performed at [facility], [place] [address] [place], [place] [address]    Culture   Final    NO ANAEROBES ISOLATED; CULTURE IN PROGRESS FOR 5 DAYS   Report Status PENDING  Incomplete     Labs: BNP (last 3 results) Recent Labs    07/30/22 1255 09/03/22 1428 10/15/22 1512  BNP 345.8* 843.8* 400.8*   Basic Metabolic Panel: Recent Labs  Lab 10/22/22 1657 10/24/22 0420 10/26/22 0331 10/28/22 0743  NA 138 136 135 134*  K 4.4 4.2 4.7 5.0  CL 101 103 104 106  CO2 '26 23 22 '$ 19*  GLUCOSE 205* 84 107* 164*  BUN 42* 33* 24* 32*  CREATININE 1.90* 1.39* 1.00 1.08*  CALCIUM 9.1 9.2 9.2 9.0   Liver Function Tests: Recent Labs  Lab 10/22/22 1657 10/24/22 0420 10/28/22 0743  AST '24 22 23  '$ ALT '14 16 16  '$ ALKPHOS 88 85 83  BILITOT 0.6 0.9 0.6  PROT 7.6 7.7 6.5  ALBUMIN 3.7 3.7 3.0*   No results for input(s): "LIPASE", "AMYLASE" in the last 168 hours. No results for input(s): "AMMONIA" in the last 168 hours. CBC: Recent Labs  Lab 10/22/22 1657 10/24/22 0420 10/26/22 0331 10/27/22 1701 10/28/22 0750  WBC 5.3 4.3 3.7* 4.0 4.8  NEUTROABS 3.5  --   --   --   --   HGB 13.5 13.2 11.6* 11.7* 10.9*  HCT 41.4 40.1 36.0 35.6* 34.5*  MCV 89.4 88.1 90.2 89.9 91.3  PLT 252 221 183 170 171   Cardiac Enzymes: No results for input(s): "CKTOTAL", "CKMB", "CKMBINDEX",  "TROPONINI" in the last 168 hours. BNP: Invalid input(s): "POCBNP" CBG: Recent Labs  Lab 10/27/22 0856 10/27/22 1131 10/27/22 1619 10/27/22 2121 10/28/22 0626  GLUCAP 131* 153* 133* 206* 166*   D-Dimer Recent Labs    10/26/22 0331 10/27/22 0628  DDIMER <0.27 <0.27   Hgb A1c No results for input(s): "HGBA1C" in the last 72 hours. Lipid Profile No results for input(s): "CHOL", "HDL", "LDLCALC", "TRIG", "CHOLHDL", "LDLDIRECT" in the last 72 hours. Thyroid function studies No results for input(s): "TSH", "T4TOTAL", "T3FREE", "THYROIDAB" in the last 72 hours.  Invalid input(s): "FREET3" Anemia work up Recent Labs    10/26/22 0331 10/27/22 0628  FERRITIN 81 81   Urinalysis    Component Value Date/Time   COLORURINE YELLOW 08/06/2018 0753   APPEARANCEUR CLEAR 08/06/2018 0753   LABSPEC 1.018 08/06/2018 0753   PHURINE 5.0 08/06/2018 0753   GLUCOSEU 50 (A) 08/06/2018 0753   HGBUR MODERATE (A) 08/06/2018 0753   BILIRUBINUR small (A) 07/02/2020 1116   BILIRUBINUR neg 09/10/2014 1219   KETONESUR trace (5) (A) 07/02/2020 1116   KETONESUR NEGATIVE 08/06/2018 0753   PROTEINUR 100 (A) 08/06/2018 0753   UROBILINOGEN 1.0 07/02/2020 1116   UROBILINOGEN 0.2 12/07/2013 0041   NITRITE Negative 07/02/2020 1116   NITRITE NEGATIVE 08/06/2018 0753   LEUKOCYTESUR Small (1+) (A) 07/02/2020 1116   Sepsis Labs Recent Labs  Lab 10/24/22 0420 10/26/22 0331 10/27/22 1701 10/28/22 0750  WBC 4.3 3.7* 4.0 4.8   Microbiology Recent Results (from the past 240 hour(s))  Resp panel by RT-PCR (RSV, Flu A&B, Covid) Anterior Nasal Swab     Status: Abnormal   Collection Time: 10/22/22  8:32 PM   Specimen: Anterior Nasal Swab  Result Value Ref Range Status   SARS Coronavirus 2 by RT PCR POSITIVE (A) NEGATIVE Final    Comment: (NOTE) SARS-CoV-2 target nucleic acids are DETECTED.  The SARS-CoV-2 RNA is generally detectable in  upper respiratory specimens during the acute phase of infection.  Positive results are indicative of the presence of the identified virus, but do not rule out bacterial infection or co-infection with other pathogens not detected by the test. Clinical correlation with patient history and other diagnostic information is necessary to determine patient infection status. The expected result is Negative.  Fact Sheet for Patients: EntrepreneurPulse.com.au  Fact Sheet for Healthcare Providers: IncredibleEmployment.be  This test is not yet approved or cleared by the [place] FDA and  has been authorized for detection and/or diagnosis of SARS-CoV-2 by FDA under an Emergency Use Authorization (EUA).  This EUA will remain in effect (meaning this test can be used) for the duration of  the COVID-19 declaration under Section 564(b)(1) of the A ct, 21 U.S.C. section 360bbb-3(b)(1), unless the authorization is terminated or revoked sooner.     Influenza A by PCR NEGATIVE NEGATIVE Final   Influenza B by PCR NEGATIVE NEGATIVE Final    Comment: (NOTE) The Xpert Xpress SARS-CoV-2/FLU/RSV plus assay is intended as an aid in the diagnosis of influenza from Nasopharyngeal swab specimens and should not be used as a sole basis for treatment. Nasal washings and aspirates are unacceptable for Xpert Xpress SARS-CoV-2/FLU/RSV testing.  Fact Sheet for Patients: EntrepreneurPulse.com.au  Fact Sheet for Healthcare Providers: IncredibleEmployment.be  This test is not yet approved or cleared by the [place] FDA and has been authorized for detection and/or diagnosis of SARS-CoV-2 by FDA under an Emergency Use Authorization (EUA). This EUA will remain in effect (meaning this test can be used) for the duration of the COVID-19 declaration under Section 564(b)(1) of the Act, 21 U.S.C. section 360bbb-3(b)(1), unless the authorization is terminated or revoked.     Resp Syncytial Virus by PCR  NEGATIVE NEGATIVE Final    Comment: (NOTE) Fact Sheet for Patients: EntrepreneurPulse.com.au  Fact Sheet for Healthcare Providers: IncredibleEmployment.be  This test is not yet approved or cleared by the [place] FDA and has been authorized for detection and/or diagnosis of SARS-CoV-2 by FDA under an Emergency Use Authorization (EUA). This EUA will remain in effect (meaning this test can be used) for the duration of the COVID-19 declaration under Section 564(b)(1) of the Act, 21 U.S.C. section 360bbb-3(b)(1), unless the authorization is terminated or revoked.  Performed at [facility], [place] [address], [place], [place] [address]   Culture, fungus without smear     Status: None (Preliminary result)   Collection Time: 10/26/22  4:06 PM   Specimen: PATH Cytology CSF; Cerebrospinal Fluid  Result Value Ref Range Status   Specimen Description CSF  Final   Special Requests NONE  Final   Culture   Final    NO FUNGUS ISOLATED AFTER 1 DAY Performed at [facility], [place] [address], [place], [place] [address]    Report Status PENDING  Incomplete  Body fluid culture w Gram Stain     Status: None (Preliminary result)   Collection Time: 10/26/22  4:06 PM   Specimen: CSF; Body Fluid  Result Value Ref Range Status   Specimen Description CSF  Final   Special Requests NONE  Final   Gram Stain CYTOSPIN SMEAR NO ORGANISMS SEEN NO WBC SEEN   Final   Culture   Final    NO GROWTH < 24 HOURS Performed at [facility], [address] [place], [place] [address]    Report Status PENDING  Incomplete  CSF culture w Gram Stain  Status: None (Preliminary result)   Collection Time: 10/26/22  8:47 PM   Specimen: CSF; Cerebrospinal Fluid  Result Value Ref Range Status   Specimen Description CSF  Final   Special Requests NONE  Final   Gram Stain NO WBC SEEN NO ORGANISMS SEEN CYTOSPIN SMEAR   Final   Culture   Final    NO GROWTH < 12  HOURS Performed at [facility], [address] [place], [place] [address]    Report Status PENDING  Incomplete  Anaerobic culture w Gram Stain     Status: None (Preliminary result)   Collection Time: 10/26/22  8:51 PM   Specimen: CSF; Cerebrospinal Fluid  Result Value Ref Range Status   Specimen Description CSF  Final   Special Requests NONE  Final   Gram Stain   Final    NO WBC SEEN NO ORGANISMS SEEN CYTOSPIN SMEAR Performed at [facility], [place] [address], [place], [place] [address]    Culture   Final    NO ANAEROBES ISOLATED; CULTURE IN PROGRESS FOR 5 DAYS   Report Status PENDING  Incomplete     Time coordinating discharge: Over 30 minutes  SIGNED:   Darliss Cheney, MD  [facility] 10/28/2022, 8:37 AM *Please note that this is a verbal dictation therefore any spelling or grammatical errors are due to the "[employer]" system interpretation. If 7PM-7AM, please contact night-coverage [email or web address]

## 2022-10-28 NOTE — Plan of Care (Signed)
VeRN reviewed AVS with pt. VeRN answered pt's questions and gave educational support. Pt has no questions at this time. VeRN also verified that pt had her belonging with her.  Problem: Education: Goal: Knowledge of risk factors and measures for prevention of condition will improve Outcome: Adequate for Discharge   Problem: Coping: Goal: Psychosocial and spiritual needs will be supported Outcome: Adequate for Discharge   Problem: Respiratory: Goal: Will maintain a patent airway Outcome: Adequate for Discharge Goal: Complications related to the disease process, condition or treatment will be avoided or minimized Outcome: Adequate for Discharge   Problem: Education: Goal: Ability to describe self-care measures that may prevent or decrease complications (Diabetes Survival Skills Education) will improve Outcome: Adequate for Discharge Goal: Individualized Educational Video(s) Outcome: Adequate for Discharge   Problem: Coping: Goal: Ability to adjust to condition or change in health will improve Outcome: Adequate for Discharge   Problem: Fluid Volume: Goal: Ability to maintain a balanced intake and output will improve Outcome: Adequate for Discharge   Problem: Health Behavior/Discharge Planning: Goal: Ability to identify and utilize available resources and services will improve Outcome: Adequate for Discharge Goal: Ability to manage health-related needs will improve Outcome: Adequate for Discharge   Problem: Metabolic: Goal: Ability to maintain appropriate glucose levels will improve Outcome: Adequate for Discharge   Problem: Nutritional: Goal: Maintenance of adequate nutrition will improve Outcome: Adequate for Discharge Goal: Progress toward achieving an optimal weight will improve Outcome: Adequate for Discharge   Problem: Skin Integrity: Goal: Risk for impaired skin integrity will decrease Outcome: Adequate for Discharge   Problem: Tissue Perfusion: Goal: Adequacy of  tissue perfusion will improve Outcome: Adequate for Discharge   Problem: Education: Goal: Knowledge of General Education information will improve Description: Including pain rating scale, medication(s)/side effects and non-pharmacologic comfort measures Outcome: Adequate for Discharge   Problem: Health Behavior/Discharge Planning: Goal: Ability to manage health-related needs will improve Outcome: Adequate for Discharge   Problem: Clinical Measurements: Goal: Ability to maintain clinical measurements within normal limits will improve Outcome: Adequate for Discharge Goal: Will remain free from infection Outcome: Adequate for Discharge Goal: Diagnostic test results will improve Outcome: Adequate for Discharge Goal: Respiratory complications will improve Outcome: Adequate for Discharge Goal: Cardiovascular complication will be avoided Outcome: Adequate for Discharge   Problem: Activity: Goal: Risk for activity intolerance will decrease Outcome: Adequate for Discharge   Problem: Nutrition: Goal: Adequate nutrition will be maintained Outcome: Adequate for Discharge   Problem: Coping: Goal: Level of anxiety will decrease Outcome: Adequate for Discharge   Problem: Elimination: Goal: Will not experience complications related to bowel motility Outcome: Adequate for Discharge Goal: Will not experience complications related to urinary retention Outcome: Adequate for Discharge   Problem: Pain Managment: Goal: General experience of comfort will improve Outcome: Adequate for Discharge   Problem: Safety: Goal: Ability to remain free from injury will improve Outcome: Adequate for Discharge   Problem: Skin Integrity: Goal: Risk for impaired skin integrity will decrease Outcome: Adequate for Discharge

## 2022-10-28 NOTE — TOC Transition Note (Addendum)
Transition of Care [facility]) - CM/SW Discharge Note   Patient Details  Name: [name] MRN: [id number] Date of Birth: [birth year]  [facility] CM/SW Contact:  Pollie Friar, RN Phone Number: 10/28/2022, 12:12 PM   Clinical Narrative:    Pt is discharging home with self care.  Information for transportation outside the hospital added to her AVS. Pt has transportation home.    Final next level of care: Home/Self Care Barriers to Discharge: No Barriers Identified   Patient Goals and CMS Choice      Discharge Placement                         Discharge Plan and Services Additional resources added to the After Visit Summary for                                       Social Determinants of Health (SDOH) Interventions SDOH Screenings   Food Insecurity: No Food Insecurity (10/23/2022)  Housing: Low Risk  (10/23/2022)  Transportation Needs: Unmet Transportation Needs (10/23/2022)  Utilities: At Risk (10/23/2022)  Alcohol Screen: Low Risk  (05/26/2022)  Depression (PHQ2-9): Low Risk  (09/24/2022)  Financial Resource Strain: Low Risk  (05/26/2022)  Stress: No Stress Concern Present (07/15/2018)  Tobacco Use: Low Risk  (10/25/2022)     Readmission Risk Interventions    04/23/2021    4:38 PM  Readmission Risk Prevention Plan  Transportation Screening Complete  HRI or Home Care Consult Complete  Social Work Consult for [place] Planning/Counseling Complete  Palliative Care Screening Not Applicable  Medication Review Press photographer) Complete

## 2022-10-29 ENCOUNTER — Telehealth: Payer: Self-pay

## 2022-10-29 ENCOUNTER — Other Ambulatory Visit: Payer: Self-pay | Admitting: Nurse Practitioner

## 2022-10-29 LAB — IGG CSF INDEX
Albumin CSF-mCnc: 11 mg/dL (ref 8–37)
Albumin: 4 g/dL (ref 3.8–4.9)
CSF IgG Index: 0.5 (ref 0.0–0.7)
IgG (Immunoglobin G), Serum: 1653 mg/dL — ABNORMAL HIGH (ref 586–1602)
IgG, CSF: 2.5 mg/dL (ref 0.0–6.7)
IgG/Alb Ratio, CSF: 0.23 (ref 0.00–0.25)

## 2022-10-29 MED ORDER — COMFORT SHIELD ADULT DIAPERS MISC: 12 refills | Status: DC

## 2022-10-29 NOTE — Telephone Encounter (Signed)
Transition Care Management Follow-up Telephone Call  Call completed with patient's mother, Elfredia Nevins Date of discharge and from where: 10/28/2022, [facility]  How have you been since you were released from the hospital? She said he daughter is doing all right Any questions or concerns? Yes- her mother wanted to know if an order was placed for adult diapers 2-3XL  Items Reviewed: Did the pt receive and understand the discharge instructions provided? Yes  Medications obtained and verified? Yes - she said they have all of her medications and did not have any questions about the med regime  Other? No  Any new allergies since your discharge? No  Dietary orders reviewed? Yes Do you have support at home? Yes - her mother  Home Care and Equipment/Supplies: Were home health services ordered? no If so, what is the name of the agency? N/a  Has the agency set up a time to come to the patient's home? not applicable Were any new equipment or medical supplies ordered?  No What is the name of the medical supply agency? N/a Were you able to get the supplies/equipment? not applicable Do you have any questions related to the use of the equipment or supplies? No  She receives services from Renee Ramus, EMT [employer]. Her mother said [facility] should be coming next Thurs - 11/05/2022.   Functional Questionnaire: (I = Independent and D = Dependent) ADLs: independent  Follow up appointments reviewed:  PCP Hospital f/u appt confirmed? Yes  Scheduled to see Roney Jaffe Clung, PA on 11/25/2022.  They really wanted to see Ms Raul Del but there were no appointments available before 11/25/2022.  I offered to schedule her at another clinic to be seen sooner but they refused.   [facility] f/u appt confirmed? Yes  Scheduled to see cardiology- 12/23/2022.   Are transportation arrangements needed? No  If their condition worsens, is the pt aware to call PCP or go to the Emergency Dept.?  Yes Was the patient provided with contact information for the PCP's office or ED? Yes Was to pt encouraged to call back with questions or concerns? Yes

## 2022-10-30 LAB — BODY FLUID CULTURE W GRAM STAIN: Culture: NO GROWTH

## 2022-10-30 LAB — CSF CULTURE W GRAM STAIN
Culture: NO GROWTH
Gram Stain: NONE SEEN

## 2022-10-31 LAB — ANAEROBIC CULTURE W GRAM STAIN: Gram Stain: NONE SEEN

## 2022-11-03 ENCOUNTER — Other Ambulatory Visit (HOSPITAL_COMMUNITY): Payer: Self-pay | Admitting: Emergency Medicine

## 2022-11-03 NOTE — Progress Notes (Signed)
Paramedicine Encounter    Patient ID: [name], female    DOB: Jan 04, 1966, 57 y.o.   MRN: [id number]   Complaints No complaints  Assessment A&O x 4, skin W&D w/ good color.  Compliance with meds No  Pill box filled x 1 week  Refills needed NONE  Meds changes since last visit  None   Social changes none   BP 120/80 (BP Location: Left Arm, Patient Position: Sitting, Cuff Size: Large)   Pulse 84   Resp 16   Wt (!) 305 lb 12.8 oz (138.7 kg)   SpO2 97%   BMI 47.90 kg/m  Weight yesterday-not taken Last visit weight-310lb  [name] A&O x 4, skin W&D w/ good color.  Pt. Denies chest pain or SOB.  Lung sounds clear and equal bilat.  She his non compliant with her meds by choice.  She does not check her blood sugar as she should.  CBG 165 today.  Med box reconciled x 1 week. Pt was wearing a  Zio patch when she went into the hospital and it was removed to in order to have scans done while in the hospital.  She wore the device x 7 days.  Packaged same to be mailed in per instructions.   Pt has an appointment with her PCP tomorrow.    ACTION: Home visit completed  Skipper Cliche [phone] 11/03/22  Patient Care Team: [name], NP as PCP - General (Nurse Practitioner) Lelon Perla, MD as PCP - Cardiology (Cardiology) Lelon Perla, MD (Cardiology) Pccm, Md, MD as Consulting Physician (Pulmonary Disease) Deberah Pelton, NP as Nurse Practitioner (Cardiology)  Patient Active Problem List   Diagnosis Date Noted   Left sided numbness 10/25/2022   Headache 10/23/2022   Essential hypertension 05/26/2022   Hypokalemia 05/26/2022   CHF exacerbation ([place]) 35/32/9924   Acute diastolic CHF (congestive heart failure) ([place]) 11/10/2021   Acute on chronic congestive heart failure ([place]) 04/18/2021   Acute on chronic heart failure ([place]) 04/17/2021   Elevated troponin    Obesity hypoventilation syndrome ([place]) 12/15/2019   Depression 12/15/2019    ILD (interstitial lung disease) ([place]) 05/28/2019   Exertional angina 05/28/2019   AKI (acute kidney injury) ([place])    Hematochezia 07/14/2018   Acute posthemorrhagic anemia 07/14/2018   GIB (gastrointestinal bleeding) 07/14/2018   Persistent atrial fibrillation ([place])    Uncontrolled type 2 diabetes mellitus with hyperglycemia ([place])    Mycobacterium avium complex ([place]) 12/13/2015   Pyrexia    Dyspnea 11/13/2015   Mediastinal adenopathy 11/13/2015   Abnormal CT scan, chest 11/11/2015   Chest pain 11/11/2015   Depression (emotion) 03/07/2015   Noninfectious gastroenteritis and colitis 01/02/2015   Sinusitis, chronic 01/02/2015   Midline low back pain without sciatica 09/10/2014   Bipolar 1 disorder, mixed, moderate ([place]) 07/02/2014   Stress incontinence 07/02/2014   Mania ([place]) 12/10/2013   Speech abnormality 12/08/2013   SVT (supraventricular tachycardia) 12/06/2013   H/O non-insulin dependent diabetes mellitus 11/28/2013   Pulmonary HTN, moderate to severe 11/03/2013   Acute on chronic diastolic congestive heart failure ([place]) 11/02/2013   Hypertensive heart disease 10/18/2013   Non-insulin dependent type 2 diabetes mellitus ([place]) 10/18/2013   Chronic diastolic heart failure ([place]) 07/23/2011   Class 3 severe obesity due to excess calories with serious comorbidity and body mass index (BMI) of 60.0 to 69.9 in adult [facility]) 02/19/2011   Non-ST elevation (NSTEMI) myocardial infarction ([place]) 04/29/2009   Bipolar disorder  Current Outpatient Medications:    amLODipine (NORVASC) 5 MG tablet, Take 1 tablet (5 mg total) by mouth daily., Disp: 90 tablet, Rfl: 3   carvedilol (COREG) 25 MG tablet, Take 1 tablet (25 mg total) by mouth 2 (two) times daily with a meal., Disp: 60 tablet, Rfl: 3   empagliflozin (JARDIANCE) 10 MG TABS tablet, Take 1 tablet (10 mg total) by mouth daily before breakfast., Disp: 30 tablet, Rfl: 6   glipiZIDE (GLUCOTROL XL) 10 MG 24 hr tablet, Take 1 tablet (10 mg  total) by mouth daily with breakfast., Disp: 90 tablet, Rfl: 0   hydrALAZINE (APRESOLINE) 25 MG tablet, Take 1 tablet (25 mg total) by mouth 3 (three) times daily., Disp: 90 tablet, Rfl: 3   hydrOXYzine (VISTARIL) 25 MG capsule, Take 1 capsule (25 mg total) by mouth every 8 (eight) hours as needed. (Patient taking differently: Take 25 mg by mouth every 8 (eight) hours as needed for itching.), Disp: 60 capsule, Rfl: 1   Incontinence Supply Disposable (COMFORT SHIELD ADULT DIAPERS) MISC, Please provide with sixe 2-3xl adult diapers. N39.3, Disp: 48 each, Rfl: 12   Incontinence Supply Disposable (INCONTINENCE BRIEF LARGE) MISC, Please provide patient with insurance approved incontinence supplies/briefs, Disp: 18 each, Rfl: 6   metFORMIN (GLUCOPHAGE) 1000 MG tablet, Take 1 tablet (1,000 mg total) by mouth 2 (two) times daily with a meal., Disp: 60 tablet, Rfl: 3   rivaroxaban (XARELTO) 20 MG TABS tablet, Take 1 tablet (20 mg total) by mouth daily with supper., Disp: 30 tablet, Rfl: 3   sacubitril-valsartan (ENTRESTO) 97-103 MG, Take 1 tablet by mouth 2 (two) times daily., Disp: 60 tablet, Rfl: 10   spironolactone (ALDACTONE) 25 MG tablet, Take 1 tablet (25 mg total) by mouth daily., Disp: 30 tablet, Rfl: 3   torsemide (DEMADEX) 20 MG tablet, Take 3 tablets daily alternating with 2 tablets daily, Disp: 360 tablet, Rfl: 0   risperiDONE (RISPERDAL) 2 MG tablet, Take 1 tablet (2 mg total) by mouth at bedtime. (Patient not taking: Reported on 11/03/2022), Disp: 30 tablet, Rfl: 3 Allergies  Allergen Reactions   Acetaminophen Other (See Comments)    Seizure-like "fits" as a child   Caffeine     Tense, anxiety, increased urination   [place] [Dapagliflozin] Other (See Comments)    Hallucinations, drop in blood sugar   Pork-Derived Products Other (See Comments)    Religious reasons. Patient verified on 1/26 okay to get heparin products if necessary   Lisinopril Rash    Rash with lisinopril; but fosinopril is  ok per patient     Social History   Socioeconomic History   Marital status: Single    Spouse name: Not on file   Number of children: 0   Years of education: 18   Highest education level: Not on file  Occupational History   Occupation: unemployed  Tobacco Use   Smoking status: Never   Smokeless tobacco: Never  Vaping Use   Vaping Use: Never used  Substance and Sexual Activity   Alcohol use: No   Drug use: No   Sexual activity: Not Currently    Birth control/protection: None  Other Topics Concern   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 But ran out of money and is no longer attending school. (Note patient has bipolar disorder).      Born in [place]  but lived in [place] before coming back to [place].       Primary language is Arabic. Lives with mother and brother.   Social Determinants of Health   Financial Resource Strain: Low Risk  (05/26/2022)   Overall Financial Resource Strain (CARDIA)    Difficulty of Paying Living Expenses: Not very hard  Food Insecurity: No Food Insecurity (10/23/2022)   Hunger Vital Sign    Worried About Running Out of Food in the Last Year: Never true    Ran Out of Food in the Last Year: Never true  Transportation Needs: Unmet Transportation Needs (10/23/2022)   PRAPARE - Transportation    Lack of Transportation (Medical): Yes    Lack of Transportation (Non-Medical): Yes  Physical Activity: Not on file  Stress: No Stress Concern Present (07/15/2018)   [place]    Feeling of Stress : Only a little  Social Connections: Not on file  Intimate Partner Violence: Not At Risk (10/23/2022)   Humiliation, Afraid, Rape, and Kick questionnaire    Fear of Current or Ex-Partner: No    Emotionally Abused: No    Physically Abused: No    Sexually Abused: No    Physical  Exam      Future Appointments  Date Time Provider [place]  11/04/2022  1:30 PM [name], NP [facility] None  11/25/2022  3:30 PM Argentina Donovan, PA-C [facility] None  12/22/2022  1:30 PM [name], NP [facility] None  12/23/2022  8:20 AM Lelon Perla, MD [facility] None  12/31/2022  2:30 PM [facility] PA/NP [facility] None

## 2022-11-04 ENCOUNTER — Other Ambulatory Visit (HOSPITAL_COMMUNITY): Payer: Self-pay

## 2022-11-04 ENCOUNTER — Telehealth: Payer: Medicaid Other | Admitting: Nurse Practitioner

## 2022-11-09 ENCOUNTER — Other Ambulatory Visit: Payer: Self-pay

## 2022-11-09 ENCOUNTER — Other Ambulatory Visit (HOSPITAL_COMMUNITY): Payer: Self-pay

## 2022-11-09 NOTE — Telephone Encounter (Signed)
Advanced Heart Failure Patient Advocate Encounter  Prior Authorization appeal for [name] has been approved through 11/07/23.  Patients co-pay is $3 (90 days)  Charlann Boxer, CPhT

## 2022-11-10 ENCOUNTER — Other Ambulatory Visit (HOSPITAL_COMMUNITY): Payer: Self-pay | Admitting: Emergency Medicine

## 2022-11-10 NOTE — Progress Notes (Signed)
Paramedicine Encounter    Patient ID: [name], female    DOB: 01-Oct-1965, 57 y.o.   MRN: [id number]   Complaints NONE  Assessment no chest pain, no sob, no edema. Lung sounds clear  Compliance with meds missed 2 a.m. doses out of the week.    Pill box filled x 1 week  Refills needed: Xarelto, Torsemide, Carvedilol and called in  Meds changes since last visit None    Social changes none   BP (!) 140/80 (BP Location: Left Arm, Patient Position: Sitting, Cuff Size: Normal)   Pulse 93   Resp 14   Wt (!) 305 lb (138.3 kg)   SpO2 96%   BMI 47.77 kg/m  Weight yesterday-not taken Last visit weight-305lb  ACTION: Home visit completed  Bethanie Dicker [phone] 11/11/22  Patient Care Team: Claiborne Rigg, NP as PCP - General (Nurse Practitioner) Lewayne Bunting, MD as PCP - Cardiology (Cardiology) Lewayne Bunting, MD (Cardiology) Pccm, Md, MD as Consulting Physician (Pulmonary Disease) Ronney Asters, NP as Nurse Practitioner (Cardiology)  Patient Active Problem List   Diagnosis Date Noted   Left sided numbness 10/25/2022   Headache 10/23/2022   Essential hypertension 05/26/2022   Hypokalemia 05/26/2022   CHF exacerbation ([facility]) 05/25/2022   Acute diastolic CHF (congestive heart failure) (H[facility]) 11/10/2021   Acute on chronic congestive heart failure ([facility]) 04/18/2021   Acute on chronic heart failure ([facility]) 04/17/2021   Elevated troponin    Obesity hypoventilation syndrome ([facility]) 12/15/2019   Depression 12/15/2019   ILD (interstitial lung disease) ([facility]) 05/28/2019   Exertional angina 05/28/2019   AKI (acute kidney injury) ([facility])    Hematochezia 07/14/2018   Acute posthemorrhagic anemia 07/14/2018   GIB (gastrointestinal bleeding) 07/14/2018   Persistent atrial fibrillation ([facility])    Uncontrolled type 2 diabetes mellitus with hyperglycemia (H[facility])    Mycobacterium avium complex ([facility]) 12/13/2015   Pyrexia    Dyspnea 11/13/2015   Mediastinal  adenopathy 11/13/2015   Abnormal CT scan, chest 11/11/2015   Chest pain 11/11/2015   Depression (emotion) 03/07/2015   Noninfectious gastroenteritis and colitis 01/02/2015   Sinusitis, chronic 01/02/2015   Midline low back pain without sciatica 09/10/2014   Bipolar 1 disorder, mixed, moderate (H[facility]) 07/02/2014   Stress incontinence 07/02/2014   Mania ([facility]) 12/10/2013   Speech abnormality 12/08/2013   SVT (supraventricular tachycardia) 12/06/2013   H/O non-insulin dependent diabetes mellitus 11/28/2013   Pulmonary HTN, moderate to severe 11/03/2013   Acute on chronic diastolic congestive heart failure (HCC) 11/02/2013   Hypertensive heart disease 10/18/2013   Non-insulin dependent type 2 diabetes mellitus (HCC) 10/18/2013   Chronic diastolic heart failure (H[facility]) 07/23/2011   Class 3 severe obesity due to excess calories with serious comorbidity and body mass index (BMI) of 60.0 to 69.9 in adult (HCC) 02/19/2011   Non-ST elevation (NSTEMI) myocardial infarction ([facility]) 04/29/2009   Bipolar disorder     Current Outpatient Medications:    amLODipine (NORVASC) 5 MG tablet, Take 1 tablet (5 mg total) by mouth daily., Disp: 90 tablet, Rfl: 3   carvedilol (COREG) 25 MG tablet, Take 1 tablet (25 mg total) by mouth 2 (two) times daily with a meal., Disp: 60 tablet, Rfl: 3   empagliflozin (JARDIANCE) 10 MG TABS tablet, Take 1 tablet (10 mg total) by mouth daily before breakfast., Disp: 30 tablet, Rfl: 6   glipiZIDE (GLUCOTROL XL) 10 MG 24 hr tablet, Take 1 tablet (10 mg total) by mouth daily with breakfast., Disp: 90 tablet,  Rfl: 0   hydrALAZINE (APRESOLINE) 25 MG tablet, Take 1 tablet (25 mg total) by mouth 3 (three) times daily., Disp: 90 tablet, Rfl: 3   hydrOXYzine (VISTARIL) 25 MG capsule, Take 1 capsule (25 mg total) by mouth every 8 (eight) hours as needed. (Patient taking differently: Take 25 mg by mouth every 8 (eight) hours as needed for itching.), Disp: 60 capsule, Rfl: 1   Incontinence  Supply Disposable (COMFORT SHIELD ADULT DIAPERS) MISC, Please provide with sixe 2-3xl adult diapers. N39.3, Disp: 48 each, Rfl: 12   Incontinence Supply Disposable (INCONTINENCE BRIEF LARGE) MISC, Please provide patient with insurance approved incontinence supplies/briefs, Disp: 18 each, Rfl: 6   metFORMIN (GLUCOPHAGE) 1000 MG tablet, Take 1 tablet (1,000 mg total) by mouth 2 (two) times daily with a meal., Disp: 60 tablet, Rfl: 3   rivaroxaban (XARELTO) 20 MG TABS tablet, Take 1 tablet (20 mg total) by mouth daily with supper., Disp: 30 tablet, Rfl: 3   sacubitril-valsartan (ENTRESTO) 97-103 MG, Take 1 tablet by mouth 2 (two) times daily., Disp: 60 tablet, Rfl: 10   spironolactone (ALDACTONE) 25 MG tablet, Take 1 tablet (25 mg total) by mouth daily., Disp: 30 tablet, Rfl: 3   torsemide (DEMADEX) 20 MG tablet, Take 3 tablets daily alternating with 2 tablets daily, Disp: 360 tablet, Rfl: 0   risperiDONE (RISPERDAL) 2 MG tablet, Take 1 tablet (2 mg total) by mouth at bedtime. (Patient not taking: Reported on 11/03/2022), Disp: 30 tablet, Rfl: 3 Allergies  Allergen Reactions   Acetaminophen Other (See Comments)    Seizure-like "fits" as a child   Caffeine     Tense, anxiety, increased urination   [place] [Dapagliflozin] Other (See Comments)    Hallucinations, drop in blood sugar   Pork-Derived Products Other (See Comments)    Religious reasons. Patient verified on 1/26 okay to get heparin products if necessary   Lisinopril Rash    Rash with lisinopril; but fosinopril is ok per patient     Social History   Socioeconomic History   Marital status: Single    Spouse name: Not on file   Number of children: 0   Years of education: 18   Highest education level: Not on file  Occupational History   Occupation: unemployed  Tobacco Use   Smoking status: Never   Smokeless tobacco: Never  Vaping Use   Vaping Use: Never used  Substance and Sexual Activity   Alcohol use: No   Drug use: No    Sexual activity: Not Currently    Birth control/protection: None  Other Topics Concern   Not on file  Social History Narrative   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 But ran out of money and is no longer attending school. (Note patient has bipolar disorder).      Born in [place] but lived in [place] before coming back to [place].       Primary language is Arabic. Lives with mother and brother.   Social Determinants of Health   Financial Resource Strain: Low Risk  (05/26/2022)   Overall Financial Resource Strain (CARDIA)    Difficulty of Paying Living Expenses: Not very hard  Food Insecurity: No Food Insecurity (10/23/2022)   Hunger Vital Sign    Worried About Running Out of Food in the Last Year: Never true    Ran Out of Food in the Last Year: Never true  Transportation Needs:  Unmet Transportation Needs (10/23/2022)   PRAPARE - Transportation    Lack of Transportation (Medical): Yes    Lack of Transportation (Non-Medical): Yes  Physical Activity: Not on file  Stress: No Stress Concern Present (07/15/2018)   [employer] of Occupational Health - Occupational Stress Questionnaire    Feeling of Stress : Only a little  Social Connections: Not on file  Intimate Partner Violence: Not At Risk (10/23/2022)   Humiliation, Afraid, Rape, and Kick questionnaire    Fear of Current or Ex-Partner: No    Emotionally Abused: No    Physically Abused: No    Sexually Abused: No    Physical Exam      Future Appointments  Date Time Provider Department Center  11/25/2022  3:30 PM Anders Simmonds, [place] [facility] None  12/22/2022  1:30 PM Claiborne Rigg, NP [facility] None  12/23/2022  8:20 AM Lewayne Bunting, MD [facility] None  12/31/2022  2:30 PM [facility] PA/NP [facility] None

## 2022-11-13 ENCOUNTER — Other Ambulatory Visit: Payer: Self-pay

## 2022-11-13 NOTE — Addendum Note (Signed)
Encounter addended by: Micki Riley, RN on: 11/13/2022 1:41 PM  Actions taken: Imaging Exam ended

## 2022-11-16 ENCOUNTER — Other Ambulatory Visit: Payer: Self-pay

## 2022-11-16 LAB — CULTURE, FUNGUS WITHOUT SMEAR

## 2022-11-17 ENCOUNTER — Other Ambulatory Visit: Payer: Self-pay

## 2022-11-17 ENCOUNTER — Other Ambulatory Visit (HOSPITAL_COMMUNITY): Payer: Self-pay | Admitting: Emergency Medicine

## 2022-11-17 NOTE — Progress Notes (Signed)
Paramedicine Encounter    Patient ID: [name], female    DOB: [birth year], 57 y.o.   MRN: [id number]   Complaints "tired all the time and insatiable appetite"  Assessment No chest pain or new SOB.  No edema to lower extremities.  Lung sounds clear in the upper lobes and diminished in the bases.  Pt up 9lbs since last visit.  Compliance with meds 100%  Pill box filled x 1 week  Refills needed Metformin, spironolactone  Meds changes since last visit none    Social changes not taken   BP 102/70 (BP Location: Left Arm, Patient Position: Sitting, Cuff Size: Large)   Pulse 91   Resp 18   Wt (!) 314 lb 6.4 oz (142.6 kg)   SpO2 96%   BMI 49.24 kg/m  Weight yesterday-not taken Last visit weight-305lb  Today [name] is up 9lbs from last weeks visit.  She denies any chest pain or new SOB.  No edema noted to her lower extremities.  She admits to making poor food choices and eating incessantly.  Her mother says, "I have to hid food to try and keep her from eating everything."  Will reach out to HF triage for possible med change orders.   Discussed nutrition with her again.  She has an appointment with her PCP 11/25/22.  We talked about her speaking with her doctor about possibly a nutritionist/dietician.   I will follow up with pt tomorrow to discuss any medication changes.   ACTION: Home visit completed  Skipper Cliche [id number] 11/17/22  Patient Care Team: [name], NP as PCP - General (Nurse Practitioner) Lelon Perla, MD as PCP - Cardiology (Cardiology) [name] Denice Bors, MD (Cardiology) Pccm, Md, MD as Consulting Physician (Pulmonary Disease) Deberah Pelton, NP as Nurse Practitioner (Cardiology)  Patient Active Problem List   Diagnosis Date Noted   Left sided numbness 10/25/2022   Headache 10/23/2022   Essential hypertension 05/26/2022   Hypokalemia 05/26/2022   CHF exacerbation ([place]) [id number]   Acute diastolic CHF (congestive  heart failure) ([place]) 11/10/2021   Acute on chronic congestive heart failure ([place]) 04/18/2021   Acute on chronic heart failure ([place]) 04/17/2021   Elevated troponin    Obesity hypoventilation syndrome ([place]) 12/15/2019   Depression 12/15/2019   ILD (interstitial lung disease) ([place]) 05/28/2019   Exertional angina 05/28/2019   AKI (acute kidney injury) ([place])    Hematochezia 07/14/2018   Acute posthemorrhagic anemia 07/14/2018   GIB (gastrointestinal bleeding) 07/14/2018   Persistent atrial fibrillation ([place])    Uncontrolled type 2 diabetes mellitus with hyperglycemia ([place])    Mycobacterium avium complex ([place]) 12/13/2015   Pyrexia    Dyspnea 11/13/2015   Mediastinal adenopathy 11/13/2015   Abnormal CT scan, chest 11/11/2015   Chest pain 11/11/2015   Depression (emotion) 03/07/2015   Noninfectious gastroenteritis and colitis 01/02/2015   Sinusitis, chronic 01/02/2015   Midline low back pain without sciatica 09/10/2014   Bipolar 1 disorder, mixed, moderate ([place]) 07/02/2014   Stress incontinence 07/02/2014   Mania ([place]) 12/10/2013   Speech abnormality 12/08/2013   SVT (supraventricular tachycardia) 12/06/2013   H/O non-insulin dependent diabetes mellitus 11/28/2013   Pulmonary HTN, moderate to severe 11/03/2013   Acute on chronic diastolic congestive heart failure ([place]) 11/02/2013   Hypertensive heart disease 10/18/2013   Non-insulin dependent type 2 diabetes mellitus ([place]) 10/18/2013   Chronic diastolic heart failure ([place]) 07/23/2011   Class 3 severe obesity due to excess calories with serious comorbidity and  body mass index (BMI) of 60.0 to 69.9 in adult [facility]) 02/19/2011   Non-ST elevation (NSTEMI) myocardial infarction ([place]) 04/29/2009   Bipolar disorder     Current Outpatient Medications:    amLODipine (NORVASC) 5 MG tablet, Take 1 tablet (5 mg total) by mouth daily., Disp: 90 tablet, Rfl: 3   carvedilol (COREG) 25 MG tablet, Take 1 tablet (25 mg total) by mouth 2 (two) times  daily with a meal., Disp: 60 tablet, Rfl: 3   empagliflozin (JARDIANCE) 10 MG TABS tablet, Take 1 tablet (10 mg total) by mouth daily before breakfast., Disp: 30 tablet, Rfl: 6   glipiZIDE (GLUCOTROL XL) 10 MG 24 hr tablet, Take 1 tablet (10 mg total) by mouth daily with breakfast., Disp: 90 tablet, Rfl: 0   hydrALAZINE (APRESOLINE) 25 MG tablet, Take 1 tablet (25 mg total) by mouth 3 (three) times daily., Disp: 90 tablet, Rfl: 3   hydrOXYzine (VISTARIL) 25 MG capsule, Take 1 capsule (25 mg total) by mouth every 8 (eight) hours as needed. (Patient taking differently: Take 25 mg by mouth every 8 (eight) hours as needed for itching.), Disp: 60 capsule, Rfl: 1   Incontinence Supply Disposable (COMFORT SHIELD ADULT DIAPERS) MISC, Please provide with sixe 2-3xl adult diapers. N39.3, Disp: 48 each, Rfl: 12   Incontinence Supply Disposable (INCONTINENCE BRIEF LARGE) MISC, Please provide patient with insurance approved incontinence supplies/briefs, Disp: 18 each, Rfl: 6   metFORMIN (GLUCOPHAGE) 1000 MG tablet, Take 1 tablet (1,000 mg total) by mouth 2 (two) times daily with a meal., Disp: 60 tablet, Rfl: 3   rivaroxaban (XARELTO) 20 MG TABS tablet, Take 1 tablet (20 mg total) by mouth daily with supper., Disp: 30 tablet, Rfl: 3   sacubitril-valsartan (ENTRESTO) 97-103 MG, Take 1 tablet by mouth 2 (two) times daily., Disp: 60 tablet, Rfl: 10   spironolactone (ALDACTONE) 25 MG tablet, Take 1 tablet (25 mg total) by mouth daily., Disp: 30 tablet, Rfl: 3   torsemide (DEMADEX) 20 MG tablet, Take 3 tablets daily alternating with 2 tablets daily, Disp: 360 tablet, Rfl: 0   risperiDONE (RISPERDAL) 2 MG tablet, Take 1 tablet (2 mg total) by mouth at bedtime. (Patient not taking: Reported on 11/03/2022), Disp: 30 tablet, Rfl: 3 Allergies  Allergen Reactions   Acetaminophen Other (See Comments)    Seizure-like "fits" as a child   Caffeine     Tense, anxiety, increased urination   [place] [Dapagliflozin] Other (See  Comments)    Hallucinations, drop in blood sugar   Pork-Derived Products Other (See Comments)    Religious reasons. Patient verified on 1/26 okay to get heparin products if necessary   Lisinopril Rash    Rash with lisinopril; but fosinopril is ok per patient     Social History   Socioeconomic History   Marital status: Single    Spouse name: Not on file   Number of children: 0   Years of education: 18   Highest education level: Not on file  Occupational History   Occupation: unemployed  Tobacco Use   Smoking status: Never   Smokeless tobacco: Never  Vaping Use   Vaping Use: Never used  Substance and Sexual Activity   Alcohol use: No   Drug use: No   Sexual activity: Not Currently    Birth control/protection: None  Other Topics Concern   Not on file  Social History Narrative   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 Then was enrolled in  a MPH program at [employer]. But ran out of money and is no longer attending school. (Note patient has bipolar disorder).      Born in [place] but lived in [place] before coming back to [place].       Primary language is Arabic. Lives with mother and brother.   Social Determinants of Health   Financial Resource Strain: Low Risk  (05/26/2022)   Overall Financial Resource Strain (CARDIA)    Difficulty of Paying Living Expenses: Not very hard  Food Insecurity: No Food Insecurity (10/23/2022)   Hunger Vital Sign    Worried About Running Out of Food in the Last Year: Never true    Ran Out of Food in the Last Year: Never true  Transportation Needs: Unmet Transportation Needs (10/23/2022)   PRAPARE - Transportation    Lack of Transportation (Medical): Yes    Lack of Transportation (Non-Medical): Yes  Physical Activity: Not on file  Stress: No Stress Concern Present (07/15/2018)   [place]    Feeling of Stress : Only a little  Social Connections: Not on file   Intimate Partner Violence: Not At Risk (10/23/2022)   Humiliation, Afraid, Rape, and Kick questionnaire    Fear of Current or Ex-Partner: No    Emotionally Abused: No    Physically Abused: No    Sexually Abused: No    Physical Exam      Future Appointments  Date Time Provider [place]  11/25/2022  3:30 PM Argentina Donovan, [place] [facility] None  12/22/2022  1:30 PM [name], NP [facility] None  12/23/2022  8:20 AM Lelon Perla, MD [facility] None  12/31/2022  2:30 PM [facility] PA/NP [facility] None

## 2022-11-18 ENCOUNTER — Telehealth (HOSPITAL_COMMUNITY): Payer: Self-pay | Admitting: Emergency Medicine

## 2022-11-18 ENCOUNTER — Other Ambulatory Visit: Payer: Self-pay

## 2022-11-18 ENCOUNTER — Telehealth (HOSPITAL_COMMUNITY): Payer: Self-pay | Admitting: Cardiology

## 2022-11-18 MED ORDER — TORSEMIDE 20 MG PO TABS
ORAL_TABLET | ORAL | 3 refills | Status: DC
  Filled 2022-11-18 – 2022-12-04 (×2): qty 90, 29d supply, fill #0
  Filled 2022-12-15: qty 90, 30d supply, fill #0

## 2022-11-18 NOTE — Telephone Encounter (Signed)
Called in refills for Spironolactone and Metformin

## 2022-11-18 NOTE — Telephone Encounter (Signed)
-----   Message from Renee Ramus, Paramedic sent at 11/17/2022  7:51 PM EST ----- Regarding: weight gain [name] is up 9lbs from last weeks visit.  Last visit vitals 140/80, HR 93,  305lbs.   Today's visit 102/70 HR 91, 314.4lbs  She denies any chest pain.  No noted edema to her lower extremities.  Lung sounds diminished in the bases.  I'm suspecting she holding fluid in her abdomen.   She does not make good food choices.  She admits basically eating constantly and complains of feeling sleepy and tired all the time.    Please advise possible med changes.  Beckie Busing, [place] 11/17/2022

## 2022-11-18 NOTE — Telephone Encounter (Signed)
Called and spoke with Blessin's mother and advised her to increase Donyelle's Torsemide to 41m daily x 2 days and then 668mdaily after that.  She verbalized that she understands same.  I will follow up with her on 2/27 @ 4:00.    DeRenee RamusEM[place]eland/21/2024

## 2022-11-18 NOTE — Telephone Encounter (Signed)
She can take torsemide 80 mg daily x 2 days then 60 mg daily after that.

## 2022-11-18 NOTE — Telephone Encounter (Signed)
Attempted to return call to patient No answer unable to leave message  -[name] with [place] aware

## 2022-11-19 ENCOUNTER — Other Ambulatory Visit: Payer: Self-pay

## 2022-11-24 ENCOUNTER — Other Ambulatory Visit (HOSPITAL_COMMUNITY): Payer: Self-pay | Admitting: Emergency Medicine

## 2022-11-24 ENCOUNTER — Other Ambulatory Visit: Payer: Self-pay

## 2022-11-24 NOTE — Progress Notes (Signed)
Paramedicine Encounter    Patient ID: [name], female    DOB: 1965/12/07, 57 y.o.   MRN: [id number]   Complaints NONE  Assessment No chest pain or SOB.  No edema noted.  Lung sounds clear and equal bilat.  Compliance with meds Yes  Pill box filled x 1 week  Refills needed Metformin  Meds changes since last visit None    Social changes None   BP 100/70 (BP Location: Left Arm, Patient Position: Sitting, Cuff Size: Normal)   Pulse 88   Resp 18   Wt (!) 308 lb (139.7 kg)   SpO2 98%   BMI 48.24 kg/m  Weight yesterday-not taken Last visit weight-314lb CBG 80  Today's visit finds [name] down 6lbs from last visit after an increase in Torsemide dose.  Blood pressure on the low side of normal especially for her.  She denies any dizziness.   She has done well with med compliance.  She denies chest pain or SOB.  Med box reconciled x 1 week.     ACTION: Home visit completed  Skipper Cliche [id number] 11/24/22  Patient Care Team: [name], NP as PCP - General (Nurse Practitioner) Lelon Perla, MD as PCP - Cardiology (Cardiology) Lelon Perla, MD (Cardiology) Pccm, Md, MD as Consulting Physician (Pulmonary Disease) Deberah Pelton, NP as Nurse Practitioner (Cardiology)  Patient Active Problem List   Diagnosis Date Noted   Left sided numbness 10/25/2022   Headache 10/23/2022   Essential hypertension 05/26/2022   Hypokalemia 05/26/2022   CHF exacerbation ([place]) [id number]   Acute diastolic CHF (congestive heart failure) ([place]) 11/10/2021   Acute on chronic congestive heart failure ([place]) 04/18/2021   Acute on chronic heart failure ([place]) 04/17/2021   Elevated troponin    Obesity hypoventilation syndrome ([place]) 12/15/2019   Depression 12/15/2019   ILD (interstitial lung disease) ([place]) 05/28/2019   Exertional angina 05/28/2019   AKI (acute kidney injury) ([place])    Hematochezia 07/14/2018   Acute posthemorrhagic anemia 07/14/2018    GIB (gastrointestinal bleeding) 07/14/2018   Persistent atrial fibrillation ([place])    Uncontrolled type 2 diabetes mellitus with hyperglycemia ([place])    Mycobacterium avium complex ([place]) 12/13/2015   Pyrexia    Dyspnea 11/13/2015   Mediastinal adenopathy 11/13/2015   Abnormal CT scan, chest 11/11/2015   Chest pain 11/11/2015   Depression (emotion) 03/07/2015   Noninfectious gastroenteritis and colitis 01/02/2015   Sinusitis, chronic 01/02/2015   Midline low back pain without sciatica 09/10/2014   Bipolar 1 disorder, mixed, moderate ([place]) 07/02/2014   Stress incontinence 07/02/2014   Mania ([place]) 12/10/2013   Speech abnormality 12/08/2013   SVT (supraventricular tachycardia) 12/06/2013   H/O non-insulin dependent diabetes mellitus 11/28/2013   Pulmonary HTN, moderate to severe 11/03/2013   Acute on chronic diastolic congestive heart failure ([place]) 11/02/2013   Hypertensive heart disease 10/18/2013   Non-insulin dependent type 2 diabetes mellitus ([place]) 10/18/2013   Chronic diastolic heart failure ([place]) 07/23/2011   Class 3 severe obesity due to excess calories with serious comorbidity and body mass index (BMI) of 60.0 to 69.9 in adult ([place]) 02/19/2011   Non-ST elevation (NSTEMI) myocardial infarction ([place]) 04/29/2009   Bipolar disorder     Current Outpatient Medications:    amLODipine (NORVASC) 5 MG tablet, Take 1 tablet (5 mg total) by mouth daily., Disp: 90 tablet, Rfl: 3   carvedilol (COREG) 25 MG tablet, Take 1 tablet (25 mg total) by mouth 2 (two) times daily with a meal.,  Disp: 60 tablet, Rfl: 3   empagliflozin (JARDIANCE) 10 MG TABS tablet, Take 1 tablet (10 mg total) by mouth daily before breakfast., Disp: 30 tablet, Rfl: 6   glipiZIDE (GLUCOTROL XL) 10 MG 24 hr tablet, Take 1 tablet (10 mg total) by mouth daily with breakfast., Disp: 90 tablet, Rfl: 0   hydrALAZINE (APRESOLINE) 25 MG tablet, Take 1 tablet (25 mg total) by mouth 3 (three) times daily., Disp: 90 tablet, Rfl: 3    hydrOXYzine (VISTARIL) 25 MG capsule, Take 1 capsule (25 mg total) by mouth every 8 (eight) hours as needed. (Patient taking differently: Take 25 mg by mouth every 8 (eight) hours as needed for itching.), Disp: 60 capsule, Rfl: 1   Incontinence Supply Disposable (COMFORT SHIELD ADULT DIAPERS) MISC, Please provide with sixe 2-3xl adult diapers. N39.3, Disp: 48 each, Rfl: 12   Incontinence Supply Disposable (INCONTINENCE BRIEF LARGE) MISC, Please provide patient with insurance approved incontinence supplies/briefs, Disp: 18 each, Rfl: 6   metFORMIN (GLUCOPHAGE) 1000 MG tablet, Take 1 tablet (1,000 mg total) by mouth 2 (two) times daily with a meal., Disp: 60 tablet, Rfl: 3   rivaroxaban (XARELTO) 20 MG TABS tablet, Take 1 tablet (20 mg total) by mouth daily with supper., Disp: 30 tablet, Rfl: 3   sacubitril-valsartan (ENTRESTO) 97-103 MG, Take 1 tablet by mouth 2 (two) times daily., Disp: 60 tablet, Rfl: 10   spironolactone (ALDACTONE) 25 MG tablet, Take 1 tablet (25 mg total) by mouth daily., Disp: 30 tablet, Rfl: 3   torsemide (DEMADEX) 20 MG tablet, Take 4 tablets (80 mg total) by mouth daily for 2 days, THEN 3 tablets (60 mg total) daily., Disp: 90 tablet, Rfl: 3   risperiDONE (RISPERDAL) 2 MG tablet, Take 1 tablet (2 mg total) by mouth at bedtime. (Patient not taking: Reported on 11/03/2022), Disp: 30 tablet, Rfl: 3 Allergies  Allergen Reactions   Acetaminophen Other (See Comments)    Seizure-like "fits" as a child   Caffeine     Tense, anxiety, increased urination   [place] [Dapagliflozin] Other (See Comments)    Hallucinations, drop in blood sugar   Pork-Derived Products Other (See Comments)    Religious reasons. Patient verified on 1/26 okay to get heparin products if necessary   Lisinopril Rash    Rash with lisinopril; but fosinopril is ok per patient     Social History   Socioeconomic History   Marital status: Single    Spouse name: Not on file   Number of children: 0   Years of  education: 18   Highest education level: Not on file  Occupational History   Occupation: unemployed  Tobacco Use   Smoking status: Never   Smokeless tobacco: Never  Vaping Use   Vaping Use: Never used  Substance and Sexual Activity   Alcohol use: No   Drug use: No   Sexual activity: Not Currently    Birth control/protection: None  Other Topics Concern   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 But ran out of money and is no longer attending school. (Note patient has bipolar disorder).      Born in [place] but lived in [place] before coming back to [place].       Primary language is Arabic. Lives with mother and brother.   Social Determinants of Health   Financial Resource Strain: Low Risk  (  05/26/2022)   Overall Financial Resource Strain (CARDIA)    Difficulty of Paying Living Expenses: Not very hard  Food Insecurity: No Food Insecurity (10/23/2022)   Hunger Vital Sign    Worried About Running Out of Food in the Last Year: Never true    Ran Out of Food in the Last Year: Never true  Transportation Needs: Unmet Transportation Needs (10/23/2022)   PRAPARE - Transportation    Lack of Transportation (Medical): Yes    Lack of Transportation (Non-Medical): Yes  Physical Activity: Not on file  Stress: No Stress Concern Present (07/15/2018)   [place]    Feeling of Stress : Only a little  Social Connections: Not on file  Intimate Partner Violence: Not At Risk (10/23/2022)   Humiliation, Afraid, Rape, and Kick questionnaire    Fear of Current or Ex-Partner: No    Emotionally Abused: No    Physically Abused: No    Sexually Abused: No    Physical Exam      Future Appointments  Date Time Provider [place]  11/25/2022  3:30 PM Argentina Donovan, [place] [facility] None  12/22/2022  1:30 PM [name], NP [facility] None  12/23/2022  8:20 AM Lelon Perla, MD [facility] None  12/31/2022  2:30 PM [facility] PA/NP [facility] None

## 2022-11-25 ENCOUNTER — Encounter: Payer: Self-pay | Admitting: Physician Assistant

## 2022-11-25 ENCOUNTER — Ambulatory Visit: Payer: Medicaid Other | Attending: Physician Assistant | Admitting: Physician Assistant

## 2022-11-25 ENCOUNTER — Other Ambulatory Visit: Payer: Self-pay

## 2022-11-25 VITALS — BP 106/75 | HR 69 | Ht 67.0 in | Wt 314.4 lb

## 2022-11-25 DIAGNOSIS — M62838 Other muscle spasm: Secondary | ICD-10-CM

## 2022-11-25 DIAGNOSIS — L84 Corns and callosities: Secondary | ICD-10-CM

## 2022-11-25 DIAGNOSIS — E1165 Type 2 diabetes mellitus with hyperglycemia: Secondary | ICD-10-CM

## 2022-11-25 DIAGNOSIS — Z7984 Long term (current) use of oral hypoglycemic drugs: Secondary | ICD-10-CM

## 2022-11-25 DIAGNOSIS — D649 Anemia, unspecified: Secondary | ICD-10-CM

## 2022-11-25 DIAGNOSIS — I4819 Other persistent atrial fibrillation: Secondary | ICD-10-CM | POA: Diagnosis not present

## 2022-11-25 DIAGNOSIS — E785 Hyperlipidemia, unspecified: Secondary | ICD-10-CM | POA: Diagnosis not present

## 2022-11-25 DIAGNOSIS — Z09 Encounter for follow-up examination after completed treatment for conditions other than malignant neoplasm: Secondary | ICD-10-CM

## 2022-11-25 LAB — GLUCOSE, POCT (MANUAL RESULT ENTRY): POC Glucose: 155 mg/dl — AB (ref 70–99)

## 2022-11-25 MED ORDER — ACCU-CHEK SOFTCLIX LANCETS MISC
1.0000 | Freq: Two times a day (BID) | 1 refills | Status: AC
  Filled 2022-11-25: qty 100, 50d supply, fill #0

## 2022-11-25 MED ORDER — METHOCARBAMOL 500 MG PO TABS
1000.0000 mg | ORAL_TABLET | Freq: Three times a day (TID) | ORAL | 0 refills | Status: DC | PRN
  Filled 2022-11-25: qty 90, 15d supply, fill #0

## 2022-11-25 MED ORDER — GLIPIZIDE ER 10 MG PO TB24
10.0000 mg | ORAL_TABLET | Freq: Every day | ORAL | 0 refills | Status: DC
  Filled 2022-11-25 – 2023-01-11 (×2): qty 90, 90d supply, fill #0

## 2022-11-25 MED ORDER — TRUE METRIX BLOOD GLUCOSE TEST VI STRP
ORAL_STRIP | 12 refills | Status: AC
  Filled 2022-11-25: qty 100, 25d supply, fill #0

## 2022-11-25 MED ORDER — ACCU-CHEK GUIDE W/DEVICE KIT
1.0000 | PACK | Freq: Two times a day (BID) | 0 refills | Status: AC
  Filled 2022-11-25: qty 1, 30d supply, fill #0

## 2022-11-25 NOTE — Progress Notes (Signed)
Patient ID: [name], female   DOB: [birth year], 57 y.o.   MRN: [id number]    [name], is a 57 y.o. female  [id number]  [id number]  DOB - 26-Sep-1966  Chief Complaint  Patient presents with   Hospitalization Follow-up       Subjective:   [name] is a 57 y.o. female here today for a follow up visit  After hospitalization 1/25-1/31/2024 with Covid and concern for stroke due to L sided weakness.  The weakness has resolved.  Her glucometer is not working and she needs a new one.  She is also c/o sore spot on her R foot.  She has muscle spasm in her trapezius B.  No recent lifting or overhead reaching.  No CP.  Breathing much improved.  Appetite is good.    Scales at home are ~7 pound off from doctor's office per the patient's mom.  Weights have been stable at home with no increase edema    From discharge summary: Brief/Interim Summary: 57 y.o. female with medical history significant for severe morbid obesity, persistent atrial fibrillation on Xarelto, OSA, type 2 diabetes, hypertension, who presented to [facility] ED with complaints of severe left-sided headache associated with left arm and left leg numbness.  No facial drooping.  No change in vision.    In the ED, CT head showing age-indeterminate strokes.  MRI of the brain revealed abnormal sulcal signal on FLAIR images in the left hemisphere raising concern for meningitis versus bleed.  Neurology was consulted.  Due to use of DOAC plan for LP was delayed.  Xarelto on hold for at least 2 days before LP can be done.  Started on heparin drip.  Started on empiric coverage for meningitis, IV vancomycin, ampicillin, Rocephin, per neurology recommendation Headache improved. No photophobia or phonophobia Initially declined LP however, later, patient agreed to procedure. Pt now s/p LP 1/29 -CSF studies unremarkable. Discussed with Neurology, recs to d/c empiric abx -F/u MRI brain obtained and reviewed with no T2/FLAIR abnormality,  thus suspect artifact on initial MRI -Neurology has signed off and cleared the patient for discharge.   Persistent atrial fibrillation, on Xarelto Rate controlled on home carvedilol resume home xarelto   COVID-19 viral infection, POA COVID-19 PCR positive on 10/22/2022 Due to multiple comorbidities and high risk for complication, given course of remdisivir.  She is asymptomatic and comfortable on room air saturating over 95%.   Acute on Chronic diastolic CHF -Diuretics initially held secondary to ARF, which has resolved, she was then thought to be having mild acute on chronic diastolic CHF, was given IV diuretics, currently saturating well on room air.  No crackles on lung exam.  Will resume home diuretics.   Type 2 diabetes with hyperglycemia Last hemoglobin A1c 6.8 on 09/24/2022. Resume home medications.   AKI, suspect prerenal in the setting of dehydration from poor oral intake Baseline creatinine appears to be 1.1 with GFR 57. Presented with creatinine 1.90 GFR 31 Creatinine now back to baseline and normal.   Severe morbid obesity BMI 48.5 Recommend weight loss outpatient with regular physical activity and healthy dieting.   OSA CPAP nightly   HTN Blood pressure only slightly elevated.  Resume home medications.   Discharge plan was discussed with patient and/or family member and they verbalized understanding and agreed with it.  Discharge Diagnoses:  Principal Problem:   Headache Active Problems:   Left sided numbness No problems updated.  ALLERGIES: Allergies  Allergen Reactions   Acetaminophen Other (See Comments)  Seizure-like "fits" as a child   Caffeine     Tense, anxiety, increased urination   [name] [Dapagliflozin] Other (See Comments)    Hallucinations, drop in blood sugar   Pork-Derived Products Other (See Comments)    Religious reasons. Patient verified on 1/26 okay to get heparin products if necessary   Lisinopril Rash    Rash with lisinopril;  but fosinopril is ok per patient    PAST MEDICAL HISTORY: Past Medical History:  Diagnosis Date   Acute on chronic diastolic congestive heart failure ([place]) 11/02/2013   10/03/2015, 11/13/2015, 08/03/2017   Atrial fibrillation ([place])    Benign essential HTN 11/28/2013   Bipolar disease, chronic ([place])    Chest pain    a. 2012 Myoview: EF 63%, no isch/infarct;  b. 04/2016 Lexiscan MV: EF 73%, no ischemia/infarct-->Low risk.   Chronic diastolic CHF (congestive heart failure) ([place]) 07/23/2011   a. 2015 Echo: EF 55-60%, Gr2 DD;  b. 09/2015 Echo: EF 60-65%, no rwma, mod dil LA, PASP 30mHg.   Cor pulmonale (chronic) (HCC)    History of thyrotoxicosis    HTN (hypertension) 11/28/2013   Hypertensive heart disease 10/18/2013   Hypoglycemia    Insulin dependent type 2 diabetes mellitus, uncontrolled    Mediastinal adenopathy    Morbid obesity due to excess calories ([place] 02/19/2011   Morbid obesity with BMI of 50.0-59.9, adult (HCC)    OSA (obstructive sleep apnea) 03/06/2011   Persistent atrial fibrillation ([place] 12/09/2017   Pulmonary HTN, moderate to severe 11/03/2013   Sinusitis, chronic 01/02/2015   SVT (supraventricular tachycardia) 12/06/2013   Uncontrolled type 2 diabetes mellitus with hyperglycemia ([place]     MEDICATIONS AT HOME: Prior to Admission medications   Medication Sig Start Date End Date Taking? Authorizing Provider  amLODipine (NORVASC) 5 MG tablet Take 1 tablet (5 mg total) by mouth daily. 10/15/22  Yes MLarey Dresser MD  Blood Glucose Monitoring Suppl (TRUE METRIX METER) w/Device KIT 1 each by Does not apply route 2 (two) times daily. 11/25/22  Yes MFreeman CaldronM, PA-C  carvedilol (COREG) 25 MG tablet Take 1 tablet (25 mg total) by mouth 2 (two) times daily with a meal. 06/24/22  Yes Clegg, Amy D, NP  empagliflozin (JARDIANCE) 10 MG TABS tablet Take 1 tablet (10 mg total) by mouth daily before breakfast. 07/08/22  Yes [place], JGenesee FNP  glucose blood (TRUE METRIX  BLOOD GLUCOSE TEST) test strip Use as instructed 11/25/22  Yes Keishla Oyer M, PA-C  hydrALAZINE (APRESOLINE) 25 MG tablet Take 1 tablet (25 mg total) by mouth 3 (three) times daily. 09/03/22 01/01/23 Yes Simmons, Brittainy M, PA-C  hydrOXYzine (VISTARIL) 25 MG capsule Take 1 capsule (25 mg total) by mouth every 8 (eight) hours as needed. Patient taking differently: Take 25 mg by mouth every 8 (eight) hours as needed for itching. 10/09/22  Yes FGildardo Pounds NP  Incontinence Supply Disposable (COMFORT SHIELD ADULT DIAPERS) MISC Please provide with sixe 2-3xl adult diapers. N39.3 10/29/22  Yes FGildardo Pounds NP  Incontinence Supply Disposable (INCONTINENCE BRIEF LARGE) MISC Please provide patient with insurance approved incontinence supplies/briefs 07/02/20  Yes FGildardo Pounds NP  metFORMIN (GLUCOPHAGE) 1000 MG tablet Take 1 tablet (1,000 mg total) by mouth 2 (two) times daily with a meal. 09/24/22  Yes Kristinia Leavy M, PA-C  methocarbamol (ROBAXIN) 500 MG tablet Take 2 tablets (1,000 mg total) by mouth every 8 (eight) hours as needed for muscle spasms. 11/25/22  Yes MArgentina Donovan PA-C  risperiDONE (RISPERDAL) 2 MG tablet Take 1 tablet (2 mg total) by mouth at bedtime. 10/09/22  Yes Charlott Rakes, MD  rivaroxaban (XARELTO) 20 MG TABS tablet Take 1 tablet (20 mg total) by mouth daily with supper. 09/24/22 12/23/22 Yes Haidee Stogsdill, Dionne Bucy, PA-C  sacubitril-valsartan (ENTRESTO) 97-103 MG Take 1 tablet by mouth 2 (two) times daily. 09/03/22  Yes Lyda Jester M, PA-C  spironolactone (ALDACTONE) 25 MG tablet Take 1 tablet (25 mg total) by mouth daily. 09/24/22  Yes Dee Paden, Dionne Bucy, PA-C  torsemide (DEMADEX) 20 MG tablet Take 4 tablets (80 mg total) by mouth daily for 2 days, THEN 3 tablets (60 mg total) daily. 11/18/22 11/20/23 Yes Larey Dresser, MD  TRUEplus Lancets 28G MISC 1 each by Does not apply route 2 (two) times daily. 11/25/22  Yes Rhodie Cienfuegos M, PA-C  glipiZIDE (GLUCOTROL XL) 10  MG 24 hr tablet Take 1 tablet (10 mg total) by mouth daily with breakfast. 11/25/22   Breken Nazari, Dionne Bucy, PA-C    ROS: Neg HEENT Neg GI Neg GU Neg psych Neg neuro  Objective:   Vitals:   11/25/22 1552  BP: 106/75  Pulse: 69  SpO2: 97%  Weight: (!) 314 lb 6.4 oz (142.6 kg)  Height: '5\' 7"'$  (1.702 m)   Exam General appearance : Awake, alert, not in any distress. Speech Clear. Not toxic looking HEENT: Atraumatic and Normocephalic Neck: Supple, no JVD. No cervical lymphadenopathy.  Chest: Good air entry bilaterally, CTAB.  No rales/rhonchi/wheezing CVS: S1 S2 regular, no murmurs.  Full S&ROM BLUE.  +trapezius spasm that is TTP B.  UEDR=intact Extremities: B/L Lower Ext shows no edema, both legs are warm to touch Neurology: Awake alert, and oriented X 3, CN II-XII intact, Non focal Skin: No Rash Small corn (~4-70m) lateral aspect of pinky toe on the R foot.  No ulceration.  Data Review Lab Results  Component Value Date   HGBA1C 6.8 (A) 09/24/2022   HGBA1C 10.5 (H) 05/25/2022   HGBA1C 6.2 (H) 11/11/2021    Assessment & Plan   1. Uncontrolled type 2 diabetes mellitus with hyperglycemia ([place] Had improved at last visit.  Continue medications and needs RF glipizide - Glucose (CBG) - Comprehensive metabolic panel - glipiZIDE (GLUCOTROL XL) 10 MG 24 hr tablet; Take 1 tablet (10 mg total) by mouth daily with breakfast.  Dispense: 90 tablet; Refill: 0 - glucose blood (TRUE METRIX BLOOD GLUCOSE TEST) test strip; Use as instructed  Dispense: 100 each; Refill: 12 - Blood Glucose Monitoring Suppl (TRUE METRIX METER) w/Device KIT; 1 each by Does not apply route 2 (two) times daily.  Dispense: 1 kit; Refill: 0 - TRUEplus Lancets 28G MISC; 1 each by Does not apply route 2 (two) times daily.  Dispense: 100 each; Refill: 1  2. Corn Showed Dr SZoe LanOTC pads and medication  3. Persistent atrial fibrillation Stable on carvedilol and xarelto  4. Dyslipidemia, goal LDL below 70 -  Comprehensive metabolic panel  5. Hospital discharge follow-up Doing well  6. Anemia, unspecified type - CBC with Differential/Platelet  7. Muscle spasm - methocarbamol (ROBAXIN) 500 MG tablet; Take 2 tablets (1,000 mg total) by mouth every 8 (eight) hours as needed for muscle spasms.  Dispense: 90 tablet; Refill: 0    Return for keep MArch appt.  The patient was given clear instructions to go to ER or return to medical center if symptoms don't improve, worsen or new problems develop. The patient verbalized understanding. The patient was told to call  to get lab results if they haven't heard anything in the next week.      Freeman Caldron, PA-C [facility] and [place], [place]   11/25/2022, 4:25 PM

## 2022-11-26 ENCOUNTER — Other Ambulatory Visit: Payer: Self-pay | Admitting: Physician Assistant

## 2022-11-26 DIAGNOSIS — R7989 Other specified abnormal findings of blood chemistry: Secondary | ICD-10-CM

## 2022-11-26 DIAGNOSIS — R944 Abnormal results of kidney function studies: Secondary | ICD-10-CM

## 2022-11-26 LAB — COMPREHENSIVE METABOLIC PANEL
ALT: 11 IU/L (ref 0–32)
AST: 21 IU/L (ref 0–40)
Albumin/Globulin Ratio: 1.3 (ref 1.2–2.2)
Albumin: 4.4 g/dL (ref 3.8–4.9)
Alkaline Phosphatase: 112 IU/L (ref 44–121)
BUN/Creatinine Ratio: 29 — ABNORMAL HIGH (ref 9–23)
BUN: 52 mg/dL — ABNORMAL HIGH (ref 6–24)
Bilirubin Total: 0.5 mg/dL (ref 0.0–1.2)
CO2: 21 mmol/L (ref 20–29)
Calcium: 9.6 mg/dL (ref 8.7–10.2)
Chloride: 101 mmol/L (ref 96–106)
Creatinine, Ser: 1.81 mg/dL — ABNORMAL HIGH (ref 0.57–1.00)
Globulin, Total: 3.3 g/dL (ref 1.5–4.5)
Glucose: 129 mg/dL — ABNORMAL HIGH (ref 70–99)
Potassium: 4.6 mmol/L (ref 3.5–5.2)
Sodium: 139 mmol/L (ref 134–144)
Total Protein: 7.7 g/dL (ref 6.0–8.5)
eGFR: 33 mL/min/{1.73_m2} — ABNORMAL LOW (ref >59)

## 2022-11-26 LAB — CBC WITH DIFFERENTIAL/PLATELET
Basophils Absolute: 0 10*3/uL (ref 0.0–0.2)
Basos: 1 %
EOS (ABSOLUTE): 0.1 10*3/uL (ref 0.0–0.4)
Eos: 2 %
Hematocrit: 42.7 % (ref 34.0–46.6)
Hemoglobin: 13.3 g/dL (ref 11.1–15.9)
Immature Grans (Abs): 0 10*3/uL (ref 0.0–0.1)
Immature Granulocytes: 0 %
Lymphocytes Absolute: 1.5 10*3/uL (ref 0.7–3.1)
Lymphs: 30 %
MCH: 28.4 pg (ref 26.6–33.0)
MCHC: 31.1 g/dL — ABNORMAL LOW (ref 31.5–35.7)
MCV: 91 fL (ref 79–97)
Monocytes Absolute: 0.5 10*3/uL (ref 0.1–0.9)
Monocytes: 11 %
Neutrophils Absolute: 2.8 10*3/uL (ref 1.4–7.0)
Neutrophils: 56 %
Platelets: 207 10*3/uL (ref 150–450)
RBC: 4.69 x10E6/uL (ref 3.77–5.28)
RDW: 12.8 % (ref 11.7–15.4)
WBC: 4.9 10*3/uL (ref 3.4–10.8)

## 2022-12-04 ENCOUNTER — Other Ambulatory Visit: Payer: Self-pay

## 2022-12-09 NOTE — Progress Notes (Signed)
Patient is now discharged from [facility].  Patient has/has not met the following goals:  Yes :Patient expresses basic understanding of medications and what they are for Yes :Patient able to verbalize heart failure specific dietary/fluid restrictions Yes :Patient is aware of who to call if they have medical concerns or if they need to schedule or change appts Yes :Patient has a scale for daily weights and weighs regularly Yes :Patient able to verbalize concerning symptoms when they should call the HF clinic (weight gain ranges, etc) Yes :Patient has a PCP and has seen within the past year or has upcoming appt Yes :Patient has reliable access to getting their medications Yes :Patient has shown they are able to reorder medications reliably Yes :Patient has had admission in past 30 days- if yes how many? No :Patient has had admission in past 90 days- if yes how many?  Discharge Comments: Ms. Honegger is being discharged from the paramedicine program.  She has the knowledge and ability to make appointments, reconcile her meds and follow up  w/ doctor's visit.  Her mother plays a huge role in her care and often enables Rayla to depend on others to do what she has the ability to do herself. She has improved with her med compliance through this program.  I encouraged her to maintain her regimens, watch her nutrition and be sure to keep track of her weight.  I advised her to reach out to the [facility] should she have questions in the future or reach out to me if I can be of assistance.    Renee Ramus, [place] 12/09/2022

## 2022-12-15 ENCOUNTER — Other Ambulatory Visit: Payer: Self-pay

## 2022-12-17 NOTE — Progress Notes (Deleted)
HPI: Follow-up chest pain, chronic diastolic congestive heart failure, atrial fibrillation, obesity and hypertension. Also with history of SVT and atrial fibrillation. 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  Ejection fraction 65%.  Left ventricular end-diastolic pressure 26 mmHg.  Procedure complicated by ventricular fibrillation requiring defibrillation. Developed contrast nephropathy following procedure. Echo 8/23 showed normal LV function, severe LVH, severe LAE, small pericardial effusion. Cardiac MRI 12/23 showed normal LV function, severe basal septal hypertrophy, severe LAE, moderate RAE, small pericardial effusion, consider FU PET to R/O sarcoid, HCM and less likely amyloid. Monitor 2/24 showed atrial fibrillation with PVCs. 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 She also had a RYR2 gene mutation. Followed by CHF clinic. Since last seen,   Current Outpatient Medications  Medication Sig Dispense Refill   Accu-Chek Softclix Lancets lancets Use 2 (two) times daily. 100 each 1   amLODipine (NORVASC) 5 MG tablet Take 1 tablet (5 mg total) by mouth daily. 90 tablet 3   Blood Glucose Monitoring Suppl (ACCU-CHEK GUIDE) w/Device KIT use to check blood sugar 1 kit 0   carvedilol (COREG) 25 MG tablet Take 1 tablet (25 mg total) by mouth 2 (two) times daily with a meal. 60 tablet 3   empagliflozin (JARDIANCE) 10 MG TABS tablet Take 1 tablet (10 mg total) by mouth daily before breakfast. 30 tablet 6   glipiZIDE (GLUCOTROL XL) 10 MG 24 hr tablet Take 1 tablet (10 mg total) by mouth daily with breakfast. 90 tablet 0   glucose blood (TRUE METRIX BLOOD GLUCOSE TEST) test strip Use as instructed 100 each 12   hydrALAZINE (APRESOLINE) 25 MG tablet Take 1 tablet (25 mg total)  by mouth 3 (three) times daily. 90 tablet 3   hydrOXYzine (VISTARIL) 25 MG capsule Take 1 capsule (25 mg total) by mouth every 8 (eight) hours as needed. (Patient taking differently: Take 25 mg by mouth every 8 (eight) hours as needed for itching.) 60 capsule 1   Incontinence Supply Disposable (COMFORT SHIELD ADULT DIAPERS) MISC Please provide with sixe 2-3xl adult diapers. N39.3 48 each 12   Incontinence Supply Disposable (INCONTINENCE BRIEF LARGE) MISC Please provide patient with insurance approved incontinence supplies/briefs 18 each 6   metFORMIN (GLUCOPHAGE) 1000 MG tablet Take 1 tablet (1,000 mg total) by mouth 2 (two) times daily with a meal. 60 tablet 3   methocarbamol (ROBAXIN) 500 MG tablet Take 2 tablets (1,000 mg total) by mouth every 8 (eight) hours as needed for muscle spasms. 90 tablet 0   risperiDONE (RISPERDAL) 2 MG tablet Take 1 tablet (2 mg total) by mouth at bedtime. 30 tablet 3   rivaroxaban (XARELTO) 20 MG TABS tablet Take 1 tablet (20 mg total) by mouth daily with supper. 30 tablet 3   sacubitril-valsartan (ENTRESTO) 97-103 MG Take 1 tablet by mouth 2 (two) times daily. 60 tablet 10   spironolactone (ALDACTONE) 25 MG tablet Take 1 tablet (25 mg total) by mouth daily. 30 tablet 3   torsemide (DEMADEX) 20 MG tablet Take 4 tablets (80 mg total) by mouth daily for 2 days, THEN 3 tablets (60 mg total) daily. 90 tablet 3   No current facility-administered medications for this visit.     Past Medical History:  Diagnosis Date   Acute on chronic diastolic congestive heart failure ([place]) 11/02/2013   10/03/2015,  11/13/2015, 08/03/2017   Atrial fibrillation ([place])    Benign essential HTN 11/28/2013   Bipolar disease, chronic ([place])    Chest pain    a. 2012 Myoview: EF 63%, no isch/infarct;  b. 04/2016 Lexiscan MV: EF 73%, no ischemia/infarct-->Low risk.   Chronic diastolic CHF (congestive heart failure) ([place]) 07/23/2011   a. 2015 Echo: EF 55-60%, Gr2 DD;  b. 09/2015 Echo: EF 60-65%,  no rwma, mod dil LA, PASP 62mmHg.   Cor pulmonale (chronic) (HCC)    History of thyrotoxicosis    HTN (hypertension) 11/28/2013   Hypertensive heart disease 10/18/2013   Hypoglycemia    Insulin dependent type 2 diabetes mellitus, uncontrolled    Mediastinal adenopathy    Morbid obesity due to excess calories ([place]) 02/19/2011   Morbid obesity with BMI of 50.0-59.9, adult (HCC)    OSA (obstructive sleep apnea) 03/06/2011   Persistent atrial fibrillation ([place]) 12/09/2017   Pulmonary HTN, moderate to severe 11/03/2013   Sinusitis, chronic 01/02/2015   SVT (supraventricular tachycardia) 12/06/2013   Uncontrolled type 2 diabetes mellitus with hyperglycemia [facility])     Past Surgical History:  Procedure Laterality Date   CARDIOVERSION N/A 04/05/2018   Procedure: CARDIOVERSION;  Surgeon: Lelon Perla, MD;  Location: [place];  Service: Cardiovascular;  Laterality: N/A;   COLONOSCOPY WITH PROPOFOL Left 07/16/2018   Procedure: COLONOSCOPY WITH PROPOFOL;  Surgeon: Ronnette Juniper, MD;  Location: [facility];  Service: Gastroenterology;  Laterality: Left;   LEFT HEART CATH AND CORONARY ANGIOGRAPHY N/A 08/04/2018   Procedure: LEFT HEART CATH AND CORONARY ANGIOGRAPHY;  Surgeon: Belva Crome, MD;  Location: [place] [facility];  Service: Cardiovascular;  Laterality: N/A;   None     POLYPECTOMY  07/16/2018   Procedure: POLYPECTOMY;  Surgeon: Ronnette Juniper, MD;  Location: [facility];  Service: Gastroenterology;;    Social History   Socioeconomic History   Marital status: Single    Spouse name: Not on file   Number of children: 0   Years of education: 61   Highest education level: Not on file  Occupational History   Occupation: unemployed  Tobacco Use   Smoking status: Never   Smokeless tobacco: Never  Vaping Use   Vaping Use: Never used  Substance and Sexual Activity   Alcohol use: No   Drug use: No   Sexual activity: Not Currently    Birth control/protection: None  Other Topics  Concern   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 But ran out of money and is no longer attending school. (Note patient has bipolar disorder).      Born in [place] but lived in [place] before coming back to [place].       Primary language is Arabic. Lives with mother and brother.   Social Determinants of Health   Financial Resource Strain: Low Risk  (05/26/2022)   Overall Financial Resource Strain (CARDIA)    Difficulty of Paying Living Expenses: Not very hard  Food Insecurity: No Food Insecurity (10/23/2022)   Hunger Vital Sign    Worried About Running Out of Food in the Last Year: Never true    Ran Out of Food in the Last Year: Never true  Transportation Needs: Unmet Transportation Needs (10/23/2022)   PRAPARE - Hydrologist (Medical): Yes    Lack of Transportation (Non-Medical): Yes  Physical  Activity: Not on file  Stress: No Stress Concern Present (07/15/2018)   [place]    Feeling of Stress : Only a little  Social Connections: Not on file  Intimate Partner Violence: Not At Risk (10/23/2022)   Humiliation, Afraid, Rape, and Kick questionnaire    Fear of Current or Ex-Partner: No    Emotionally Abused: No    Physically Abused: No    Sexually Abused: No    Family History  Problem Relation Age of Onset   Heart failure Father    Stroke Father    Hypertension Mother    Heart disease Maternal Grandfather     ROS: no fevers or chills, productive cough, hemoptysis, dysphasia, odynophagia, melena, hematochezia, dysuria, hematuria, rash, seizure activity, orthopnea, PND, pedal edema, claudication. Remaining systems are negative.  Physical Exam: Well-developed well-nourished in no acute distress.  Skin is warm and dry.  HEENT is normal.  Neck is supple.  Chest is clear to  auscultation with normal expansion.  Cardiovascular exam is regular rate and rhythm.  Abdominal exam nontender or distended. No masses palpated. Extremities show no edema. neuro grossly intact  ECG- personally reviewed  A/P  1 permanent atrial fibrillation-continue carvedilol and Xarelto.  Plan is rate control and anticoagulation.    2 chronic diastolic congestive heart failure-continue entresto, coreg, jardiance, demadex spironolactone; volume status appears reasonable; followed in CHF clinic.    3 hypertension-patient's blood pressure is controlled.  Continue  present medical regimen.   4 chronic chest pain-symptoms are unchanged.  Will not pursue further ischemia evaluation.  Note electrocardiogram shows no ST changes.   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  6 possible LV variant arrhythmogenic CM versus HCM-family h/o sudden death. Pt has been referred to EP for consideration of ICD. Also referred to Dr Broadus John for genetic testing.    7 morbid obesity-we discussed weight loss today. Will refer to pharmacy for semaglutide.  8 OSA-not compliant with CPAP.   [name], MD

## 2022-12-21 ENCOUNTER — Other Ambulatory Visit: Payer: Self-pay

## 2022-12-22 ENCOUNTER — Other Ambulatory Visit: Payer: Self-pay

## 2022-12-22 ENCOUNTER — Ambulatory Visit: Payer: Medicaid Other | Attending: Nurse Practitioner | Admitting: Nurse Practitioner

## 2022-12-22 ENCOUNTER — Encounter: Payer: Self-pay | Admitting: Nurse Practitioner

## 2022-12-22 VITALS — BP 139/87 | HR 85 | Ht 67.0 in | Wt 327.4 lb

## 2022-12-22 DIAGNOSIS — G4733 Obstructive sleep apnea (adult) (pediatric): Secondary | ICD-10-CM | POA: Diagnosis not present

## 2022-12-22 DIAGNOSIS — E1165 Type 2 diabetes mellitus with hyperglycemia: Secondary | ICD-10-CM

## 2022-12-22 DIAGNOSIS — I10 Essential (primary) hypertension: Secondary | ICD-10-CM

## 2022-12-22 DIAGNOSIS — R7989 Other specified abnormal findings of blood chemistry: Secondary | ICD-10-CM

## 2022-12-22 MED ORDER — ATORVASTATIN CALCIUM 10 MG PO TABS
10.0000 mg | ORAL_TABLET | Freq: Every day | ORAL | 3 refills | Status: DC
  Filled 2022-12-22: qty 90, 90d supply, fill #0

## 2022-12-22 MED ORDER — OZEMPIC (0.25 OR 0.5 MG/DOSE) 2 MG/3ML ~~LOC~~ SOPN
0.2500 mg | PEN_INJECTOR | SUBCUTANEOUS | 1 refills | Status: DC
  Filled 2022-12-22: qty 3, 56d supply, fill #0
  Filled 2023-01-19: qty 3, 28d supply, fill #0
  Filled 2023-03-08: qty 3, 28d supply, fill #1
  Filled 2023-03-08: qty 3, 56d supply, fill #1

## 2022-12-22 NOTE — Progress Notes (Signed)
Trouble falling asleep.

## 2022-12-22 NOTE — Patient Instructions (Addendum)
[facility] Address: [address], [place], [place] [address] Phone: [phone]     Sleep meds:  Ashwaghanda Melatonin

## 2022-12-22 NOTE — Progress Notes (Addendum)
Assessment & Plan:  [name] was seen today for hypertension.  Diagnoses and all orders for this visit:  Primary hypertension -     CMP14+EGFR Continue all antihypertensives as prescribed.  Reminded to bring in blood pressure log for follow  up appointment.  RECOMMENDATIONS: DASH/Mediterranean Diets are healthier choices for HTN.    Uncontrolled type 2 diabetes mellitus with hyperglycemia (HCC) -     Microalbumin / creatinine urine ratio -     Hemoglobin A1c -     CMP14+EGFR -     Semaglutide,0.25 or 0.5MG /DOS, (OZEMPIC, 0.25 OR 0.5 MG/DOSE,) 2 MG/3ML SOPN; Inject 0.25 mg into the skin once a week. Continue blood sugar control as discussed in office today, low carbohydrate diet, and regular physical exercise as tolerated, 150 minutes per week (30 min each day, 5 days per week, or 50 min 3 days per week). Keep blood sugar logs with fasting goal of 90-130 mg/dl, post prandial (after you eat) less than 180.  For Hypoglycemia: BS <60 and Hyperglycemia BS >400; contact the clinic ASAP. Annual eye exams and foot exams are recommended.   Blood triglycerides increased compared with prior measurement -     Lipid panel -     atorvastatin (LIPITOR) 10 MG tablet; Take 1 tablet (10 mg total) by mouth daily.  OSA on CPAP Plan mask desensitization study scheduled for 01-04-2023   Patient has been counseled on age-appropriate routine health concerns for screening and prevention. These are reviewed and up-to-date. Referrals have been placed accordingly. Immunizations are up-to-date or declined.    Subjective:   Chief Complaint  Patient presents with   Hypertension   Hypertension Pertinent negatives include no blurred vision, chest pain, headaches, malaise/fatigue, palpitations or shortness of breath.   [name] [age] y.o. female presents to office today for follow up to HTN and DM She is accompanied by her mother today who is also providing some of [name]'s current medical history   PMH  significant for:  AonC diastolic congestive heart failure (11/02/2013), Benign essential HTN (11/28/2013), Bipolar disease, chronic, Cor pulmonale, History of thyrotoxicosis, DM2,  Mediastinal adenopathy,  Morbid obesity with BMI >50, OSA (obstructive sleep apnea) DOES NOT USE BIPAP,   Persistent Afib (12/09/2017), Pulmonary HTN, moderate to severe (11/03/2013),  SVT (12/06/2013)    She is followed by Cardiology as well  She does not have any need to home O2 at this time. Wants the DME company to pick up O2 from home. O2 sats 99% on RA.   OSA on CPAP She states she does not like the way her mask fits on her face. It is causing her not to be able to fall asleep and she does not feel she is getting enough oxygen at night with her current mask. She has been using her CPAP machine for 2-3 years now.  HTN Blood pressure is slightly higher than normal today but does not require any changes with her medications. She endorses adherence with taking her medications as follows: carvedilol 25 mg BID, taking hydralazine 25 mg BID and not TID, entresto 97-103 mg BID, spironolactone 25 mg daily, amlodipine 5 mg daily and torsemide 60 mg daily BP Readings from Last 3 Encounters:  12/22/22 139/87  11/25/22 106/75  11/24/22 100/70     DM 2 Well controlled. She is no longer taking jardiance due to drowsiness. We will switch to ozempic and she will continue on metformin 1000 mg BID Lab Results  Component Value Date   HGBA1C 6.8 (A)  09/24/2022    Lab Results  Component Value Date   HGBA1C 6.8 (A) 09/24/2022   Lab Results  Component Value Date   LDLCALC 52 10/02/2020   The ASCVD Risk score (Arnett DK, et al., 2019) failed to calculate for the following reasons:   The patient has a prior MI or stroke diagnosis   Review of Systems  Constitutional:  Negative for fever, malaise/fatigue and weight loss.  HENT: Negative.  Negative for nosebleeds.   Eyes: Negative.  Negative for blurred vision, double vision and  photophobia.  Respiratory: Negative.  Negative for cough and shortness of breath.   Cardiovascular: Negative.  Negative for chest pain, palpitations and leg swelling.  Gastrointestinal: Negative.  Negative for heartburn, nausea and vomiting.  Musculoskeletal: Negative.  Negative for myalgias.  Neurological: Negative.  Negative for dizziness, focal weakness, seizures and headaches.  Psychiatric/Behavioral:  Negative for suicidal ideas. The patient has insomnia (recommend ashwaghanda or melatonin).     Past Medical History:  Diagnosis Date   Acute on chronic diastolic congestive heart failure ([place]) 11/02/2013   10/03/2015, 11/13/2015, 08/03/2017   Atrial fibrillation ([place])    Benign essential HTN 11/28/2013   Bipolar disease, chronic ([place])    Chest pain    a. 2012 Myoview: EF 63%, no isch/infarct;  b. 04/2016 Lexiscan MV: EF 73%, no ischemia/infarct-->Low risk.   Chronic diastolic CHF (congestive heart failure) ([place]) 07/23/2011   a. 2015 Echo: EF 55-60%, Gr2 DD;  b. 09/2015 Echo: EF 60-65%, no rwma, mod dil LA, PASP 21mmHg.   Cor pulmonale (chronic) (HCC)    History of thyrotoxicosis    HTN (hypertension) 11/28/2013   Hypertensive heart disease 10/18/2013   Hypoglycemia    Insulin dependent type 2 diabetes mellitus, uncontrolled    Mediastinal adenopathy    Morbid obesity due to excess calories ([place]) 02/19/2011   Morbid obesity with BMI of 50.0-59.9, adult (HCC)    OSA (obstructive sleep apnea) 03/06/2011   Persistent atrial fibrillation ([place]) 12/09/2017   Pulmonary HTN, moderate to severe 11/03/2013   Sinusitis, chronic 01/02/2015   SVT (supraventricular tachycardia) 12/06/2013   Uncontrolled type 2 diabetes mellitus with hyperglycemia [facility])     Past Surgical History:  Procedure Laterality Date   CARDIOVERSION N/A 04/05/2018   Procedure: CARDIOVERSION;  Surgeon: Lelon Perla, MD;  Location: [place];  Service: Cardiovascular;  Laterality: N/A;   COLONOSCOPY WITH PROPOFOL  Left 07/16/2018   Procedure: COLONOSCOPY WITH PROPOFOL;  Surgeon: Ronnette Juniper, MD;  Location: [facility];  Service: Gastroenterology;  Laterality: Left;   LEFT HEART CATH AND CORONARY ANGIOGRAPHY N/A 08/04/2018   Procedure: LEFT HEART CATH AND CORONARY ANGIOGRAPHY;  Surgeon: Belva Crome, MD;  Location: [place] [facility];  Service: Cardiovascular;  Laterality: N/A;   None     POLYPECTOMY  07/16/2018   Procedure: POLYPECTOMY;  Surgeon: Ronnette Juniper, MD;  Location: [facility];  Service: Gastroenterology;;    Family History  Problem Relation Age of Onset   Heart failure Father    Stroke Father    Hypertension Mother    Heart disease Maternal Grandfather     Social History Reviewed with no changes to be made today.   Outpatient Medications Prior to Visit  Medication Sig Dispense Refill   Accu-Chek Softclix Lancets lancets Use 2 (two) times daily. 100 each 1   amLODipine (NORVASC) 5 MG tablet Take 1 tablet (5 mg total) by mouth daily. 90 tablet 3   Blood Glucose Monitoring Suppl (ACCU-CHEK GUIDE) w/Device KIT  use to check blood sugar 1 kit 0   carvedilol (COREG) 25 MG tablet Take 1 tablet (25 mg total) by mouth 2 (two) times daily with a meal. 60 tablet 3   glipiZIDE (GLUCOTROL XL) 10 MG 24 hr tablet Take 1 tablet (10 mg total) by mouth daily with breakfast. 90 tablet 0   glucose blood (TRUE METRIX BLOOD GLUCOSE TEST) test strip Use as instructed 100 each 12   hydrALAZINE (APRESOLINE) 25 MG tablet Take 1 tablet (25 mg total) by mouth 3 (three) times daily. 90 tablet 3   hydrOXYzine (VISTARIL) 25 MG capsule Take 1 capsule (25 mg total) by mouth every 8 (eight) hours as needed. (Patient taking differently: Take 25 mg by mouth every 8 (eight) hours as needed for itching.) 60 capsule 1   Incontinence Supply Disposable (COMFORT SHIELD ADULT DIAPERS) MISC Please provide with sixe 2-3xl adult diapers. N39.3 48 each 12   Incontinence Supply Disposable (INCONTINENCE BRIEF LARGE) MISC Please  provide patient with insurance approved incontinence supplies/briefs 18 each 6   metFORMIN (GLUCOPHAGE) 1000 MG tablet Take 1 tablet (1,000 mg total) by mouth 2 (two) times daily with a meal. 60 tablet 3   methocarbamol (ROBAXIN) 500 MG tablet Take 2 tablets (1,000 mg total) by mouth every 8 (eight) hours as needed for muscle spasms. 90 tablet 0   risperiDONE (RISPERDAL) 2 MG tablet Take 1 tablet (2 mg total) by mouth at bedtime. 30 tablet 3   rivaroxaban (XARELTO) 20 MG TABS tablet Take 1 tablet (20 mg total) by mouth daily with supper. 30 tablet 3   sacubitril-valsartan (ENTRESTO) 97-103 MG Take 1 tablet by mouth 2 (two) times daily. 60 tablet 10   spironolactone (ALDACTONE) 25 MG tablet Take 1 tablet (25 mg total) by mouth daily. 30 tablet 3   torsemide (DEMADEX) 20 MG tablet Take 4 tablets (80 mg total) by mouth daily for 2 days, THEN 3 tablets (60 mg total) daily. 90 tablet 3   empagliflozin (JARDIANCE) 10 MG TABS tablet Take 1 tablet (10 mg total) by mouth daily before breakfast. (Patient not taking: Reported on 12/22/2022) 30 tablet 6   No facility-administered medications prior to visit.    Allergies  Allergen Reactions   Acetaminophen Other (See Comments)    Seizure-like "fits" as a child   Caffeine     Tense, anxiety, increased urination   [place] [Dapagliflozin] Other (See Comments)    Hallucinations, drop in blood sugar   Pork-Derived Products Other (See Comments)    Religious reasons. Patient verified on 1/26 okay to get heparin products if necessary   Lisinopril Rash    Rash with lisinopril; but fosinopril is ok per patient       Objective:    BP 139/87   Pulse 85   Ht 5\' 7"  (1.702 m)   Wt (!) 327 lb 6.4 oz (148.5 kg)   SpO2 99%   BMI 51.28 kg/m  Wt Readings from Last 3 Encounters:  12/22/22 (!) 327 lb 6.4 oz (148.5 kg)  11/25/22 (!) 314 lb 6.4 oz (142.6 kg)  11/24/22 (!) 308 lb (139.7 kg)    Physical Exam Vitals and nursing note reviewed.  Constitutional:       Appearance: She is well-developed.  HENT:     Head: Normocephalic and atraumatic.  Cardiovascular:     Rate and Rhythm: Normal rate and regular rhythm.     Heart sounds: Normal heart sounds. No murmur heard.    No friction rub. No gallop.  Pulmonary:     Effort: Pulmonary effort is normal. No tachypnea or respiratory distress.     Breath sounds: Normal breath sounds. No decreased breath sounds, wheezing, rhonchi or rales.  Chest:     Chest wall: No tenderness.  Abdominal:     General: Bowel sounds are normal.     Palpations: Abdomen is soft.  Musculoskeletal:        General: Normal range of motion.     Cervical back: Normal range of motion.  Skin:    General: Skin is warm and dry.  Neurological:     Mental Status: She is alert and oriented to person, place, and time.     Coordination: Coordination normal.  Psychiatric:        Behavior: Behavior normal. Behavior is cooperative.        Thought Content: Thought content normal.        Judgment: Judgment normal.          Patient has been counseled extensively about nutrition and exercise as well as the importance of adherence with medications and regular follow-up. The patient was given clear instructions to go to ER or return to medical center if symptoms don't improve, worsen or new problems develop. The patient verbalized understanding.   Follow-up: Return in about 3 months (around 03/24/2023).   [name], FNP-BC [facility] [place], [place]   12/22/2022, 2:45 PM

## 2022-12-23 ENCOUNTER — Ambulatory Visit: Payer: Medicaid Other | Attending: Cardiology | Admitting: Cardiology

## 2022-12-23 ENCOUNTER — Other Ambulatory Visit: Payer: Self-pay

## 2022-12-23 LAB — LIPID PANEL
Chol/HDL Ratio: 3.2 ratio (ref 0.0–4.4)
Cholesterol, Total: 163 mg/dL (ref 100–199)
HDL: 51 mg/dL (ref >39)
LDL Chol Calc (NIH): 95 mg/dL (ref 0–99)
Triglycerides: 93 mg/dL (ref 0–149)
VLDL Cholesterol Cal: 17 mg/dL (ref 5–40)

## 2022-12-23 LAB — CMP14+EGFR
ALT: 12 IU/L (ref 0–32)
AST: 22 IU/L (ref 0–40)
Albumin/Globulin Ratio: 1.3 (ref 1.2–2.2)
Albumin: 4.6 g/dL (ref 3.8–4.9)
Alkaline Phosphatase: 129 IU/L — ABNORMAL HIGH (ref 44–121)
BUN/Creatinine Ratio: 25 — ABNORMAL HIGH (ref 9–23)
BUN: 27 mg/dL — ABNORMAL HIGH (ref 6–24)
Bilirubin Total: 0.4 mg/dL (ref 0.0–1.2)
CO2: 25 mmol/L (ref 20–29)
Calcium: 9.7 mg/dL (ref 8.7–10.2)
Chloride: 99 mmol/L (ref 96–106)
Creatinine, Ser: 1.1 mg/dL — ABNORMAL HIGH (ref 0.57–1.00)
Globulin, Total: 3.5 g/dL (ref 1.5–4.5)
Glucose: 110 mg/dL — ABNORMAL HIGH (ref 70–99)
Potassium: 4.3 mmol/L (ref 3.5–5.2)
Sodium: 140 mmol/L (ref 134–144)
Total Protein: 8.1 g/dL (ref 6.0–8.5)
eGFR: 60 mL/min/{1.73_m2} (ref >59)

## 2022-12-23 LAB — HEMOGLOBIN A1C
Est. average glucose Bld gHb Est-mCnc: 140 mg/dL
Hgb A1c MFr Bld: 6.5 % — ABNORMAL HIGH (ref 4.8–5.6)

## 2022-12-23 LAB — MICROALBUMIN / CREATININE URINE RATIO
Creatinine, Urine: 51.4 mg/dL
Microalb/Creat Ratio: 571 mg/g creat — ABNORMAL HIGH (ref 0–29)
Microalbumin, Urine: 293.7 ug/mL

## 2022-12-28 ENCOUNTER — Encounter: Payer: Self-pay | Admitting: *Deleted

## 2022-12-30 ENCOUNTER — Other Ambulatory Visit: Payer: Self-pay

## 2022-12-31 ENCOUNTER — Other Ambulatory Visit: Payer: Self-pay

## 2022-12-31 ENCOUNTER — Encounter (HOSPITAL_COMMUNITY): Payer: Self-pay

## 2022-12-31 ENCOUNTER — Ambulatory Visit (HOSPITAL_COMMUNITY)
Admission: RE | Admit: 2022-12-31 | Discharge: 2022-12-31 | Disposition: A | Discharge status: Home / Self Care | Payer: Medicaid Other | Source: Ambulatory Visit | Attending: Cardiology | Admitting: Cardiology

## 2022-12-31 VITALS — BP 100/66 | HR 84 | Wt 340.2 lb

## 2022-12-31 DIAGNOSIS — Z6841 Body Mass Index (BMI) 40.0 and over, adult: Secondary | ICD-10-CM | POA: Diagnosis not present

## 2022-12-31 DIAGNOSIS — I5032 Chronic diastolic (congestive) heart failure: Secondary | ICD-10-CM | POA: Insufficient documentation

## 2022-12-31 DIAGNOSIS — Z91199 Patient's noncompliance with other medical treatment and regimen due to unspecified reason: Secondary | ICD-10-CM | POA: Insufficient documentation

## 2022-12-31 DIAGNOSIS — Q245 Malformation of coronary vessels: Secondary | ICD-10-CM | POA: Insufficient documentation

## 2022-12-31 DIAGNOSIS — Z8249 Family history of ischemic heart disease and other diseases of the circulatory system: Secondary | ICD-10-CM | POA: Insufficient documentation

## 2022-12-31 DIAGNOSIS — Z7984 Long term (current) use of oral hypoglycemic drugs: Secondary | ICD-10-CM | POA: Diagnosis not present

## 2022-12-31 DIAGNOSIS — G4733 Obstructive sleep apnea (adult) (pediatric): Secondary | ICD-10-CM | POA: Insufficient documentation

## 2022-12-31 DIAGNOSIS — I4819 Other persistent atrial fibrillation: Secondary | ICD-10-CM | POA: Diagnosis not present

## 2022-12-31 DIAGNOSIS — I110 Hypertensive heart disease with heart failure: Secondary | ICD-10-CM | POA: Diagnosis not present

## 2022-12-31 DIAGNOSIS — Z7901 Long term (current) use of anticoagulants: Secondary | ICD-10-CM | POA: Diagnosis not present

## 2022-12-31 DIAGNOSIS — Z79899 Other long term (current) drug therapy: Secondary | ICD-10-CM | POA: Insufficient documentation

## 2022-12-31 DIAGNOSIS — E119 Type 2 diabetes mellitus without complications: Secondary | ICD-10-CM | POA: Insufficient documentation

## 2022-12-31 LAB — BASIC METABOLIC PANEL
Anion gap: 11 (ref 5–15)
BUN: 48 mg/dL — ABNORMAL HIGH (ref 6–20)
CO2: 24 mmol/L (ref 22–32)
Calcium: 9 mg/dL (ref 8.9–10.3)
Chloride: 98 mmol/L (ref 98–111)
Creatinine, Ser: 1.82 mg/dL — ABNORMAL HIGH (ref 0.44–1.00)
GFR, Estimated: 33 mL/min — ABNORMAL LOW (ref >60)
Glucose, Bld: 201 mg/dL — ABNORMAL HIGH (ref 70–99)
Potassium: 5 mmol/L (ref 3.5–5.1)
Sodium: 133 mmol/L — ABNORMAL LOW (ref 135–145)

## 2022-12-31 LAB — BRAIN NATRIURETIC PEPTIDE: B Natriuretic Peptide: 348.3 pg/mL — ABNORMAL HIGH (ref 0.0–100.0)

## 2022-12-31 MED ORDER — TORSEMIDE 20 MG PO TABS
80.0000 mg | ORAL_TABLET | Freq: Every day | ORAL | 5 refills | Status: DC
  Filled 2022-12-31 – 2023-01-19 (×2): qty 120, 30d supply, fill #0
  Filled 2023-02-26: qty 120, 30d supply, fill #1
  Filled 2023-03-30: qty 120, 30d supply, fill #2
  Filled 2023-05-05 (×2): qty 120, 30d supply, fill #3
  Filled 2023-05-05: qty 36, 9d supply, fill #3
  Filled 2023-05-06: qty 84, 21d supply, fill #3
  Filled 2023-06-13: qty 120, 30d supply, fill #4
  Filled 2023-07-25: qty 120, 30d supply, fill #5

## 2022-12-31 NOTE — Patient Instructions (Signed)
Labs done today. We will contact you only if your labs are abnormal.  INCREASE Lasix to 80mg  (4 tablets)  by mouth daily.   No other medication changes were made. Please continue all current medications as prescribed.  You have been referred to Electrophysiology. They will contact you to schedule an appointment.   Your physician recommends that you schedule a follow-up appointment in: 4 weeks with our NP/[facility] Clinic here in our office.   If you have any questions or concerns before your next appointment please send [place] a message through [place] or call our office at [phone].    TO LEAVE A MESSAGE FOR THE NURSE SELECT OPTION 2, PLEASE LEAVE A MESSAGE INCLUDING: YOUR NAME DATE OF BIRTH CALL BACK NUMBER REASON FOR CALL**this is important as we prioritize the call backs  YOU WILL RECEIVE A CALL BACK THE SAME DAY AS LONG AS YOU CALL BEFORE 4:00 PM   Do the following things EVERYDAY: Weigh yourself in the morning before breakfast. Write it down and keep it in a log. Take your medicines as prescribed Eat low salt foods--Limit salt (sodium) to 2000 mg per day.  Stay as active as you can everyday Limit all fluids for the day to less than 2 liters   At the [facility], you and your health needs are our priority. As part of our continuing mission to provide you with exceptional heart care, we have created designated Provider Care Teams. These Care Teams include your primary Cardiologist (physician) and Advanced Practice Providers (APPs- Physician Assistants and Nurse Practitioners) who all work together to provide you with the care you need, when you need it.   You may see any of the following providers on your designated Care Team at your next follow up: Dr Glori Bickers Dr Haynes Kerns, NP Lyda Jester, [place] Audry Riles, PharmD   Please be sure to bring in all your medications bottles to every appointment.

## 2022-12-31 NOTE — Progress Notes (Signed)
[facility]  Note  PCP: [name], NP HF Cardiologist: Dr. Aundra Dubin  HPI: Pt is a 57 y.o.female with a history of chronic diastolic heart failure, persistent atrial fibrillation on Xarelto, hypertension, obesity and OSA, not compliant w/ CPAP.  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  She had VFib with heart cath, treated w/ defibrillation to NSR. She also developed AKI after cath.  Echo 04/18/21 with EF 60-65%, no RWMA, severe LVH, RV normal with moderately elevated PA systolic pressure, Lt atrial size severely dilated and RA mildly dilated.  Mild MR, mod to severe TR.   Of note, brother had sudden cardiac death around age 26 and father and grandmother had CHF.    Admitted [id number] for a/c diastolic CHF w/ fluid overload. Afib well rate controlled. Diuresed w/ IV Lasix, then transitioned to GDMT. Discharge home, weight 328 lbs.  Admitted 05/25/22 with acute on chronic HFpEF. Ran out her medications while traveling out of the country. Diuresed with IV lasix and transitioned to torsemide 40 mg bid. Echo showed LVEF 60-65%, severe LVH, septal thickenness over 20 mm but difficult to measure, RV normal.  Discharge weight 308 pounds.   cMRI 12/23 showed severe LAE with moderate RAE, severe basal septal hypertrophy 17 mm with LVEF normal 64%, no RWMAs, normal RV size and function RVEF 51%, abnormal delayed enhancement images with mottled LGE in apex and septum and more defined area of moderate LGE in mid inferior wall, normal parametric measures.  Unlikely cardiac amyloidosis, consider sarcoidosis or HCM.  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 She also had a RYR2 gene mutation.   7 day Zio 2/24 Atrial Fibrillation occurred continuously (100% burden),  ranging from 56-133 bpm (avg of 81 bpm). Isolated VEs were rare (<1.0%, 1345), VE Couplets were rare (<1.0%, 90), and VE Triplets were rare (<1.0%, 1)  She returns today for f/u. Wt is up, up 26 lb in several months. She admits to increased caloric intake, particularly at night. She says she has been compliant w/ diuretic but history is unreliable. She was previously followed by para medicine but discharged from the program for poor compliance. She also reports reports that she altered her diuretic regimen and for the last 4 wks has been taking torsemide at night instead of daytime as she has been fasting for Ramadan. She denies resting dyspnea but SOB w/ mild exertion. No CP. Denies syncope/ near syncope.    .  1. Predominantly atrial fibrillation 2. Rare PVCs  ECG (personally reviewed): not performed   Labs (7/23): Myeloma panel negative Labs (9/23): K 4.2, creatinine 1.36 Labs (10/23): K 4.2, creatinine 1.34, BNP 455 Labs (12/23): BNP 843, K 4, creatinine 1.2 Labs (3/24): Scr 1.10, K 4.3   PMH: 1. Atrial fibrillation: Chronic since 2019.  2. HTN 3. OSA: Unable to tolerate CPAP.  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   - Cardiolite (8/17): No ischemia 5. Chronic diastolic CHF: Echo (99991111): EF 60-65%, severe LVH, Septal thickenness over 34mm but difficult to measure, RV normal.   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 EF >65%.  - Chest CT 2019 Prominent mediastinal lymph nodes, likely reactive. Lungs/Pleura: +Calcified granulomas. - Cardiac  MRI (12/23): Severe LAE with moderate RAE, severe basal septal hypertrophy 17 mm with LVEF normal 64%, no RWMAs, normal RV size and function RVEF 51%, abnormal delayed enhancement images with mottled LGE in apex and septum and more defined area of moderate LGE in mid inferior wall, normal parametric measures.   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 She also had a RYR2 gene mutation.  6. Bipolar disorder 7. 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 But ran out of money and is no longer attending school. (Note patient has bipolar disorder).      Born in [place] but lived in [place] before coming back to [place].       Primary language is Arabic. Lives with mother and brother.   Social Determinants of Health   Financial Resource Strain: Low Risk  (05/26/2022)   Overall Financial Resource Strain (CARDIA)    Difficulty of Paying Living Expenses: Not very hard  Food Insecurity: No Food Insecurity (10/23/2022)   Hunger Vital Sign    Worried About Running Out of Food in the Last Year: Never true    Ran Out of Food in the Last Year: Never true  Transportation Needs: Unmet Transportation Needs (10/23/2022)   PRAPARE - Transportation    Lack of Transportation (Medical): Yes    Lack of Transportation (Non-Medical): Yes  Physical Activity: Not on file  Stress: No Stress Concern Present (07/15/2018)   [place]    Feeling of Stress : Only a little  Social Connections: Not on file  Intimate Partner Violence: Not At Risk (10/23/2022)   Humiliation, Afraid, Rape, and  Kick questionnaire    Fear of Current or Ex-Partner: No    Emotionally Abused: No    Physically Abused: No    Sexually Abused: No   FH:  Brother with sudden cardiac death, father with CVA  Current Outpatient Medications  Medication Sig Dispense Refill   Accu-Chek Softclix Lancets lancets Use 2 (two) times daily. 100 each 1   amLODipine (NORVASC) 5 MG tablet Take 1 tablet (5 mg total) by mouth daily. 90 tablet 3   atorvastatin (LIPITOR) 10 MG tablet Take 1 tablet (10 mg total) by mouth daily. 90 tablet 3   Blood Glucose Monitoring Suppl (ACCU-CHEK GUIDE) w/Device KIT use to check blood sugar 1 kit 0   carvedilol (COREG) 25 MG tablet Take 1 tablet (25 mg total) by mouth 2 (two) times daily with a meal. 60 tablet 3   glipiZIDE (GLUCOTROL XL) 10 MG 24 hr tablet Take 1 tablet (10 mg total) by mouth daily with breakfast. 90 tablet 0   glucose blood (TRUE  METRIX BLOOD GLUCOSE TEST) test strip Use as instructed 100 each 12   hydrALAZINE (APRESOLINE) 25 MG tablet Take 1 tablet (25 mg total) by mouth 3 (three) times daily. (Patient taking differently: Take 25 mg by mouth 2 (two) times daily at 10 AM and 5 PM.) 90 tablet 3   hydrOXYzine (VISTARIL) 25 MG capsule Take 1 capsule (25 mg total) by mouth every 8 (eight) hours as needed. (Patient taking differently: Take 25 mg by mouth daily.) 60 capsule 1   Incontinence Supply Disposable (COMFORT SHIELD ADULT DIAPERS) MISC Please provide with sixe 2-3xl adult diapers. N39.3 48 each 12   Incontinence Supply Disposable (INCONTINENCE BRIEF LARGE) MISC Please provide patient with insurance approved incontinence supplies/briefs 18 each 6   metFORMIN (GLUCOPHAGE) 1000 MG tablet Take 1 tablet (1,000 mg total) by mouth 2 (two) times daily with a meal. 60 tablet 3   methocarbamol (ROBAXIN) 500 MG tablet Take 2 tablets (1,000 mg total) by mouth every 8 (eight) hours as needed for muscle spasms. 90 tablet 0   risperiDONE (RISPERDAL) 2 MG tablet Take 1 tablet (2 mg  total) by mouth at bedtime. 30 tablet 3   rivaroxaban (XARELTO) 20 MG TABS tablet Take 1 tablet (20 mg total) by mouth daily with supper. 30 tablet 3   sacubitril-valsartan (ENTRESTO) 97-103 MG Take 1 tablet by mouth 2 (two) times daily. 60 tablet 10   spironolactone (ALDACTONE) 25 MG tablet Take 1 tablet (25 mg total) by mouth daily. 30 tablet 3   torsemide (DEMADEX) 20 MG tablet Patient takes 3 tablets in the morning.     Semaglutide,0.25 or 0.5MG /DOS, (OZEMPIC, 0.25 OR 0.5 MG/DOSE,) 2 MG/3ML SOPN Inject 0.25 mg into the skin once a week. (Patient not taking: Reported on 12/31/2022) 9 mL 1   No current facility-administered medications for this encounter.   BP 100/66   Pulse 84   Wt (!) 154.3 kg (340 lb 3.2 oz)   SpO2 93%   BMI 53.28 kg/m   Wt Readings from Last 3 Encounters:  12/31/22 (!) 154.3 kg (340 lb 3.2 oz)  12/22/22 (!) 148.5 kg (327 lb 6.4 oz)  11/25/22 (!) 142.6 kg (314 lb 6.4 oz)   PHYSICAL EXAM: General:  morbidly obese,  No respiratory difficulty HEENT: normal Neck: supple. JVD 9 cm. Carotids 2+ bilat; no bruits. No lymphadenopathy or thyromegaly appreciated. Cor: PMI nondisplaced. Irregularly irregular rhythm. No rubs, gallops or murmurs. Lungs: decreased BS at the bases bilaterally  Abdomen: obese, soft, nontender, nondistended. No hepatosplenomegaly. No bruits or masses. Good bowel sounds. Extremities: no cyanosis, clubbing, rash, edema Neuro: alert & oriented x 3, cranial nerves grossly intact. moves all 4 extremities w/o difficulty. Affect pleasant.   ASSESSMENT & PLAN: 1. Chronic Diastolic CHF:  Echo (99991111) with EF 60-65%, severe LVH, Septal thickenness over 75mm but difficult to measure, RV normal.   Severe LVH may be secondary to HTN, but also was concerned for possible hypertrophic cardiomyopathy.  Father/grandmother with CHF and brother with sudden death is concerning for familial cardiomyopathy.  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 cMRI w/ normal sized RV and function, RVEF 51%, severe basal septal hypertrophy 17 mm, LVEF normal 64% no RWMAs, abnormal LGE in con-coronary pattern with mottled uptake in apex and septum and more defined area of moderate uptake in mid inferior wall.  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  Cardiac MRI was not suggestive of cardiac amyloidosis. Zio 2/24 showed 100% afib, no ventricular arrythmias - She reports NYHA Class III symptoms, confounded by morbid obesity and deconditioning. She has had progressive wt gain over the last several months. Body habitus makes volume assessment difficulty. Uncertain how much wt gain is fluid from HF vs caloric but suspect a least mild volume overload.  - Increase Torsemide to 80 mg daily  - Continue Entresto 97-103 mg bid  - She did not tolerate Jardiance (says it made her BP too low, refuses retrial)                                                                                                                                                                 - Continue spironolactone 25 mg daily  - Continue carvedilol 25 mg bid. - Refer to Dr. Lattie Corns for genetic counseling.   - Previously discussed Cardiomems placement but she refused.  - I suspect she may have LV variant arrhythmogenic cardiomyopathy (versus a form of HCM).  She has significant LGE (scarring) in the LV, she has a family history of sudden death in 1st degree relatively, and she has a history VF at time of prior cath. I think she is high enough risk to warrant ICD.  I will refer her to EP for ICD consideration. Recent Zio did not show any high grade ventricular arrhytmias, but think ICD should still be considered.   - compliance  has been an issue. Was referred to paramedicine but did not cooperate and discharged from the program  2. Hypertension: controlled on current regimen  - GDMT per above.  3. Atrial fibrillation: Chronic since at least 2019. Zio 2.24 showed 100% burden.  Unlikely to cardiovert and stay in NSR at this point given chronicity and noncompliance with CPAP.  Rate controlled.  - Continue Coreg 25 mg bid  - Continue Xarelto. Denies abnormal bleeding, check CBC today    4. OSA: Not compliant w/ CPAP.  - encouraged her to improve compliance   5. Morbid Obesity: Body mass index is 53.28 kg/m.  We referred her for semaglutide but she never answered the phone or returned the calls from pharmacy clinic. It appears her PCP is working to now get her started on this  6. DM II: Per PCP.  7. Anomalous RCA: Off the left cusp, appears to course interarterially between aorta and PA.  Cardiolite in 2017 showed no ischemia.  With an anomalous right, no attributable symptoms (no chest pain), and normal Cardiolite in past, would medically manage this.  - continue statin + ? blocker - no ASA w/ Xarelto use  F/u in 3-4 wks w/ APP   Lyda Jester, PA-C  12/31/2022

## 2023-01-04 ENCOUNTER — Other Ambulatory Visit (HOSPITAL_BASED_OUTPATIENT_CLINIC_OR_DEPARTMENT_OTHER): Payer: Medicaid Other

## 2023-01-08 ENCOUNTER — Other Ambulatory Visit (HOSPITAL_COMMUNITY): Payer: Medicaid Other

## 2023-01-11 ENCOUNTER — Other Ambulatory Visit: Payer: Self-pay

## 2023-01-12 ENCOUNTER — Other Ambulatory Visit: Payer: Self-pay

## 2023-01-19 ENCOUNTER — Ambulatory Visit (HOSPITAL_COMMUNITY)
Admission: RE | Admit: 2023-01-19 | Discharge: 2023-01-19 | Disposition: A | Discharge status: Home / Self Care | Payer: Medicaid Other | Source: Ambulatory Visit | Attending: Cardiology | Admitting: Cardiology

## 2023-01-19 ENCOUNTER — Other Ambulatory Visit: Payer: Self-pay

## 2023-01-19 DIAGNOSIS — I5032 Chronic diastolic (congestive) heart failure: Secondary | ICD-10-CM | POA: Insufficient documentation

## 2023-01-19 LAB — BASIC METABOLIC PANEL
Anion gap: 10 (ref 5–15)
BUN: 30 mg/dL — ABNORMAL HIGH (ref 6–20)
CO2: 25 mmol/L (ref 22–32)
Calcium: 8.8 mg/dL — ABNORMAL LOW (ref 8.9–10.3)
Chloride: 101 mmol/L (ref 98–111)
Creatinine, Ser: 1.28 mg/dL — ABNORMAL HIGH (ref 0.44–1.00)
GFR, Estimated: 50 mL/min — ABNORMAL LOW (ref >60)
Glucose, Bld: 181 mg/dL — ABNORMAL HIGH (ref 70–99)
Potassium: 4.2 mmol/L (ref 3.5–5.1)
Sodium: 136 mmol/L (ref 135–145)

## 2023-01-19 LAB — BRAIN NATRIURETIC PEPTIDE: B Natriuretic Peptide: 613 pg/mL — ABNORMAL HIGH (ref 0.0–100.0)

## 2023-01-28 ENCOUNTER — Encounter (HOSPITAL_COMMUNITY): Payer: Medicaid Other

## 2023-01-29 ENCOUNTER — Encounter (HOSPITAL_COMMUNITY): Payer: Self-pay

## 2023-01-29 ENCOUNTER — Ambulatory Visit (HOSPITAL_COMMUNITY)
Admission: RE | Admit: 2023-01-29 | Discharge: 2023-01-29 | Disposition: A | Discharge status: Home / Self Care | Payer: Medicaid Other | Source: Ambulatory Visit | Attending: Family Medicine | Admitting: Family Medicine

## 2023-01-29 VITALS — BP 106/74 | HR 86 | Wt 349.0 lb

## 2023-01-29 DIAGNOSIS — I482 Chronic atrial fibrillation, unspecified: Secondary | ICD-10-CM

## 2023-01-29 DIAGNOSIS — F319 Bipolar disorder, unspecified: Secondary | ICD-10-CM | POA: Insufficient documentation

## 2023-01-29 DIAGNOSIS — I5032 Chronic diastolic (congestive) heart failure: Secondary | ICD-10-CM | POA: Diagnosis not present

## 2023-01-29 DIAGNOSIS — Z7984 Long term (current) use of oral hypoglycemic drugs: Secondary | ICD-10-CM | POA: Insufficient documentation

## 2023-01-29 DIAGNOSIS — R42 Dizziness and giddiness: Secondary | ICD-10-CM | POA: Diagnosis not present

## 2023-01-29 DIAGNOSIS — Z79899 Other long term (current) drug therapy: Secondary | ICD-10-CM | POA: Insufficient documentation

## 2023-01-29 DIAGNOSIS — Z5982 Transportation insecurity: Secondary | ICD-10-CM | POA: Diagnosis not present

## 2023-01-29 DIAGNOSIS — I428 Other cardiomyopathies: Secondary | ICD-10-CM | POA: Insufficient documentation

## 2023-01-29 DIAGNOSIS — Z6841 Body Mass Index (BMI) 40.0 and over, adult: Secondary | ICD-10-CM | POA: Diagnosis not present

## 2023-01-29 DIAGNOSIS — I10 Essential (primary) hypertension: Secondary | ICD-10-CM

## 2023-01-29 DIAGNOSIS — I110 Hypertensive heart disease with heart failure: Secondary | ICD-10-CM | POA: Insufficient documentation

## 2023-01-29 DIAGNOSIS — R0602 Shortness of breath: Secondary | ICD-10-CM | POA: Diagnosis not present

## 2023-01-29 DIAGNOSIS — I4819 Other persistent atrial fibrillation: Secondary | ICD-10-CM | POA: Diagnosis not present

## 2023-01-29 DIAGNOSIS — Z8249 Family history of ischemic heart disease and other diseases of the circulatory system: Secondary | ICD-10-CM | POA: Diagnosis not present

## 2023-01-29 DIAGNOSIS — Q245 Malformation of coronary vessels: Secondary | ICD-10-CM

## 2023-01-29 DIAGNOSIS — Z560 Unemployment, unspecified: Secondary | ICD-10-CM | POA: Insufficient documentation

## 2023-01-29 DIAGNOSIS — E1169 Type 2 diabetes mellitus with other specified complication: Secondary | ICD-10-CM

## 2023-01-29 DIAGNOSIS — I4901 Ventricular fibrillation: Secondary | ICD-10-CM | POA: Diagnosis not present

## 2023-01-29 DIAGNOSIS — Z7901 Long term (current) use of anticoagulants: Secondary | ICD-10-CM | POA: Diagnosis not present

## 2023-01-29 DIAGNOSIS — E119 Type 2 diabetes mellitus without complications: Secondary | ICD-10-CM | POA: Diagnosis not present

## 2023-01-29 DIAGNOSIS — G4733 Obstructive sleep apnea (adult) (pediatric): Secondary | ICD-10-CM | POA: Diagnosis not present

## 2023-01-29 LAB — BASIC METABOLIC PANEL
Anion gap: 10 (ref 5–15)
BUN: 29 mg/dL — ABNORMAL HIGH (ref 6–20)
CO2: 27 mmol/L (ref 22–32)
Calcium: 8.9 mg/dL (ref 8.9–10.3)
Chloride: 102 mmol/L (ref 98–111)
Creatinine, Ser: 1.33 mg/dL — ABNORMAL HIGH (ref 0.44–1.00)
GFR, Estimated: 48 mL/min — ABNORMAL LOW (ref >60)
Glucose, Bld: 144 mg/dL — ABNORMAL HIGH (ref 70–99)
Potassium: 4.1 mmol/L (ref 3.5–5.1)
Sodium: 139 mmol/L (ref 135–145)

## 2023-01-29 NOTE — Patient Instructions (Addendum)
Thank you for coming in today  Labs were done today, if any labs are abnormal the clinic will call you No news is good news  You have been given a prescription for compression hose. And a list of places who supply them. Please wear your compression hose daily, place them on as soon as you get up in the morning and remove before you go to bed at night.   Medications: No changes   Follow up appointments:  Your physician recommends that you schedule a follow-up appointment in:  3  months With Dr. Shirlee Latch     Do the following things EVERYDAY: Weigh yourself in the morning before breakfast. Write it down and keep it in a log. Take your medicines as prescribed Eat low salt foods--Limit salt (sodium) to 2000 mg per day.  Stay as active as you can everyday Limit all fluids for the day to less than 2 liters   At the [facility], you and your health needs are our priority. As part of our continuing mission to provide you with exceptional heart care, we have created designated Provider Care Teams. These Care Teams include your primary Cardiologist (physician) and Advanced Practice Providers (APPs- Physician Assistants and Nurse Practitioners) who all work together to provide you with the care you need, when you need it.   You may see any of the following providers on your designated Care Team at your next follow up: Dr Arvilla Meres Dr Marca Ancona Dr. Marcos Eke, NP Robbie Lis, [place] [facility] [place], [place] Brynda Peon, NP Karle Plumber, PharmD   Please be sure to bring in all your medications bottles to every appointment.    Thank you for choosing [facility]  If you have any questions or concerns before your next appointment please send [place] a message through [place] or call our office at [phone].    TO LEAVE A MESSAGE FOR THE NURSE SELECT OPTION 2, PLEASE LEAVE A MESSAGE  INCLUDING: YOUR NAME DATE OF BIRTH CALL BACK NUMBER REASON FOR CALL**this is important as we prioritize the call backs  YOU WILL RECEIVE A CALL BACK THE SAME DAY AS LONG AS YOU CALL BEFORE 4:00 PM

## 2023-01-29 NOTE — Progress Notes (Signed)
[facility]  Note  PCP: Claiborne Rigg, NP HF Cardiologist: Dr. Shirlee Latch  HPI: Pt is a 57 y.o.female with a history of chronic diastolic heart failure, persistent atrial fibrillation on Xarelto, hypertension, obesity and OSA, not compliant w/ CPAP.  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  She had VFib with heart cath, treated w/ defibrillation to NSR. She also developed AKI after cath.  Echo 04/18/21 with EF 60-65%, no RWMA, severe LVH, RV normal with moderately elevated PA systolic pressure, Lt atrial size severely dilated and RA mildly dilated.  Mild MR, mod to severe TR.   Of note, brother had sudden cardiac death around age 39 and father and grandmother had CHF.    Admitted 11/10/21 for a/c diastolic CHF w/ fluid overload. Afib well rate controlled. Diuresed w/ IV Lasix, then transitioned to GDMT. Discharge home, weight 328 lbs.  Admitted 05/25/22 with acute on chronic HFpEF. Ran out her medications while traveling out of the country. Diuresed with IV lasix and transitioned to torsemide 40 mg bid. Echo showed LVEF 60-65%, severe LVH, septal thickenness over 20 mm but difficult to measure, RV normal.  Discharge weight 308 pounds.   cMRI 12/23 showed severe LAE with moderate RAE, severe basal septal hypertrophy 17 mm with LVEF normal 64%, no RWMAs, normal RV size and function RVEF 51%, abnormal delayed enhancement images with mottled LGE in apex and septum and more defined area of moderate LGE in mid inferior wall, normal parametric measures.  Unlikely cardiac amyloidosis, consider sarcoidosis or HCM.  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 She also had a RYR2 gene mutation.   7 day Zio 2/24 Atrial Fibrillation occurred continuously (100% burden),  ranging from 56-133 bpm (avg of 81 bpm). Isolated VEs were rare (<1.0%, 1345), VE Couplets were rare (<1.0%, 90), and VE Triplets were rare (<1.0%, 1)  Today she returns for AHF follow up. Has been feeling ok. Just finished fasting last week (but ate same amount of meals over night). Chronically fatigued. Denies palpitations, CP, edema, or PND/Orthopnea. Lives with her mom. SOB and dizzy when ambulating short distances. Feels ok with basic ADLs. Wears depends. Appetite good, states has been eating too much, suspects she may be depressed. No fever or chills. Weight at home around 340 pounds. Takes medications about "80%" of the time.  SBP at home in 120s.  ECG (personally reviewed): not performed   Labs (7/23): Myeloma panel negative Labs (9/23): K 4.2, creatinine 1.36 Labs (10/23): K 4.2, creatinine 1.34, BNP 455 Labs (12/23): BNP 843, K 4, creatinine 1.2 Labs (3/24): Scr 1.10, K 4.3  Labs (4/24): SCr 1.28, K 4.2  PMH: 1. Atrial fibrillation: Chronic since 2019.  2. HTN 3. OSA: Unable to tolerate CPAP.  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   - Cardiolite (8/17): No ischemia 5. Chronic diastolic CHF: Echo (8/23): EF 16-10%, severe LVH, Septal thickenness over 20mm but difficult to measure, RV normal.   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 EF >65%.  - Chest CT 2019 Prominent mediastinal lymph nodes, likely reactive. Lungs/Pleura: +Calcified granulomas. - Cardiac MRI (12/23): Severe LAE with moderate RAE, severe basal septal hypertrophy 17 mm with LVEF normal 64%,  no RWMAs, normal RV size and function RVEF 51%, abnormal delayed enhancement images with mottled LGE in apex and septum and more defined area of moderate LGE in mid inferior wall, normal parametric measures.   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 She also had a RYR2 gene mutation.  6. Bipolar disorder 7. 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 But ran out of money and is no longer attending school. (Note patient has bipolar disorder).      Born in [place] but lived in [place] before coming back to [place].       Primary language is Arabic. Lives with mother and brother.   Social Determinants of Health   Financial Resource Strain: Low Risk  (05/26/2022)   Overall Financial Resource Strain (CARDIA)    Difficulty of Paying Living Expenses: Not very hard  Food Insecurity: No Food Insecurity (10/23/2022)   Hunger Vital Sign    Worried About Running Out of Food in the Last Year: Never true    Ran Out of Food in the Last Year: Never true  Transportation Needs: Unmet Transportation Needs (10/23/2022)   PRAPARE - Transportation    Lack of Transportation (Medical): Yes    Lack of Transportation (Non-Medical): Yes  Physical Activity: Not on file  Stress: No Stress Concern Present (07/15/2018)   [employer] of Occupational Health - Occupational Stress Questionnaire    Feeling of Stress : Only a little  Social Connections: Not on file  Intimate Partner Violence: Not At Risk (10/23/2022)   Humiliation, Afraid, Rape, and Kick questionnaire    Fear of Current or Ex-Partner: No    Emotionally Abused: No     Physically Abused: No    Sexually Abused: No   FH:  Brother with sudden cardiac death, father with CVA  Current Outpatient Medications  Medication Sig Dispense Refill   Accu-Chek Softclix Lancets lancets Use 2 (two) times daily. 100 each 1   amLODipine (NORVASC) 5 MG tablet Take 1 tablet (5 mg total) by mouth daily. 90 tablet 3   atorvastatin (LIPITOR) 10 MG tablet Take 1 tablet (10 mg total) by mouth daily. 90 tablet 3   Blood Glucose Monitoring Suppl (ACCU-CHEK GUIDE) w/Device KIT use to check blood sugar 1 kit 0   carvedilol (COREG) 25 MG tablet Take 1 tablet (25 mg total) by mouth 2 (two) times daily with a meal. 60 tablet 3   glipiZIDE (GLUCOTROL XL) 10 MG 24 hr tablet Take 1 tablet (10 mg total) by mouth daily with breakfast. 90 tablet 0   glucose blood (TRUE METRIX BLOOD GLUCOSE TEST) test strip Use as instructed 100 each 12   hydrALAZINE (APRESOLINE) 25  MG tablet Take 1 tablet (25 mg total) by mouth 3 (three) times daily. (Patient taking differently: Take 25 mg by mouth 2 (two) times daily at 10 AM and 5 PM.) 90 tablet 3   hydrOXYzine (VISTARIL) 25 MG capsule Take 1 capsule (25 mg total) by mouth every 8 (eight) hours as needed. (Patient taking differently: Take 25 mg by mouth daily.) 60 capsule 1   Incontinence Supply Disposable (COMFORT SHIELD ADULT DIAPERS) MISC Please provide with sixe 2-3xl adult diapers. N39.3 48 each 12   Incontinence Supply Disposable (INCONTINENCE BRIEF LARGE) MISC Please provide patient with insurance approved incontinence supplies/briefs 18 each 6   metFORMIN (GLUCOPHAGE) 1000 MG tablet Take 1 tablet (1,000 mg total) by mouth 2 (two) times daily with a meal. 60 tablet 3   methocarbamol (ROBAXIN) 500 MG tablet Take 2 tablets (1,000 mg total) by mouth every 8 (eight) hours as needed for muscle spasms. 90 tablet 0   risperiDONE (RISPERDAL) 2 MG tablet Take 1 tablet (2 mg total) by mouth at bedtime. 30 tablet 3   rivaroxaban (XARELTO) 20 MG TABS tablet Take 1  tablet (20 mg total) by mouth daily with supper. 30 tablet 3   sacubitril-valsartan (ENTRESTO) 97-103 MG Take 1 tablet by mouth 2 (two) times daily. 60 tablet 10   Semaglutide,0.25 or 0.5MG /DOS, (OZEMPIC, 0.25 OR 0.5 MG/DOSE,) 2 MG/3ML SOPN Inject 0.25 mg into the skin once a week. 9 mL 1   spironolactone (ALDACTONE) 25 MG tablet Take 1 tablet (25 mg total) by mouth daily. 30 tablet 3   torsemide (DEMADEX) 20 MG tablet Take 4 tablets (80 mg total) by mouth daily. (Patient taking differently: Patient takes 2 tablets by mouth in the morning and evening.) 120 tablet 5   No current facility-administered medications for this encounter.   BP 106/74   Pulse 86   Wt (!) 158.3 kg (349 lb)   SpO2 97%   BMI 54.66 kg/m   Wt Readings from Last 3 Encounters:  01/29/23 (!) 158.3 kg (349 lb)  12/31/22 (!) 154.3 kg (340 lb 3.2 oz)  12/22/22 (!) 148.5 kg (327 lb 6.4 oz)   PHYSICAL EXAM: General:  chronically ill appearing.  Arrived via wheelchair HEENT: + glasses Neck: supple. JVD difficult to see, does not appear elevated. Carotids 2+ bilat; no bruits. No lymphadenopathy or thyromegaly appreciated. Cor: PMI nondisplaced. Regular rate & rhythm. No rubs, gallops or murmurs. Lungs: clear Abdomen: morbidly obese, soft, nontender, nondistended. No hepatosplenomegaly. No bruits or masses. Good bowel sounds. Extremities: no cyanosis, clubbing, rash, +1 BLE edema  Neuro: alert & oriented x 3, cranial nerves grossly intact. moves all 4 extremities w/o difficulty. Affect pleasant.   ASSESSMENT & PLAN: 1. Chronic Diastolic CHF:  Echo (8/23) with EF 60-65%, severe LVH, Septal thickenness over 20mm but difficult to measure, RV normal.   Severe LVH may be secondary to HTN, but also was concerned for possible hypertrophic cardiomyopathy.  Father/grandmother with CHF and brother with sudden death is concerning for familial cardiomyopathy.  Gene testing showed pathogenic variant in PKP2 gene, associated w/ autosomal  dominant arrhythmogenic right ventricular cardiomyopathy, and pathogenic variant in RYR2 gene (mutation in this gene can cause CPVT and more rarely DCM). cMRI w/ normal sized RV and function, RVEF 51%, severe basal septal hypertrophy 17 mm, LVEF normal 64% no RWMAs, abnormal LGE in con-coronary pattern with mottled uptake in apex and septum and more defined area of moderate uptake in mid inferior wall.  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  Cardiac MRI was not suggestive of cardiac amyloidosis. Zio 2/24 showed 100% afib, no ventricular arrythmias - She reports NYHA Class III symptoms, confounded by morbid obesity and deconditioning. She has had progressive wt gain over the last several months. Body habitus makes volume assessment difficulty. Uncertain how much wt gain is fluid from HF vs caloric but suspect a least mild volume overload.  - Continue Torsemide to 80 mg daily, has been taking 40 mg BID. Ok with that however does skip doses. Reiterated importance of not skipping doses, as long as she stick with current regimen her volume should improve. - Continue Entresto 97-103 mg bid. BMET today - She did not tolerate Jardiance (says it made her BP too low, refuses retrial)                                                                                                                                                                 - Continue spironolactone 25 mg daily  - Continue carvedilol 25 mg bid. - Referred to Dr. Sidney Ace for genetic counseling, unable to reach patient   - Previously discussed Cardiomems placement but she refused.  - Suspect she may have LV variant arrhythmogenic cardiomyopathy (versus a form of HCM).  She has significant LGE (scarring) in the LV, she has a family history of sudden death in 1st degree relatively, and she has a history VF at time of prior cath. I think she is  high enough risk to warrant ICD.  Was referred to EP for ICD consideration in January, has f/u scheduled next week. Recent Zio did not show any high grade ventricular arrhytmias, but think ICD should still be considered.   - compliance has been an issue. Was referred to paramedicine but did not cooperate and discharged from the program  2. Hypertension: controlled on current regimen  - GDMT per above.  3. Atrial fibrillation: Chronic since at least 2019. Zio 2.24 showed 100% burden.  Unlikely to cardiovert and stay in NSR at this point given chronicity and noncompliance with CPAP.  Rate controlled.  - Continue Coreg 25 mg bid  - Continue Xarelto. Denies abnormal bleeding 4. OSA: Not compliant w/ CPAP.  - encouraged her to improve compliance   5. Morbid Obesity: Body mass index is 54.66 kg/m.  Now on semaglutide. Per PCP 6. DM II: Per PCP.  7. Anomalous RCA: Off the left cusp, appears to course interarterially between aorta and PA.  Cardiolite in 2017 showed no ischemia.  With an anomalous right, no attributable symptoms (no chest pain), and normal Cardiolite in past, would medically manage this.  - continue statin + ? blocker - no ASA w/ Xarelto  use   F/u in 3 months with Dr. Thressa Sheller, AGACNP-BC  01/29/2023

## 2023-01-29 NOTE — Progress Notes (Signed)
ReDS Vest / Clip - 01/29/23 1400       ReDS Vest / Clip   Station Marker D    Ruler Value 39    ReDS Value Range Moderate volume overload    ReDS Actual Value 37

## 2023-02-02 ENCOUNTER — Encounter: Payer: Self-pay | Admitting: Cardiovascular Disease

## 2023-02-02 ENCOUNTER — Ambulatory Visit: Payer: Medicaid Other | Attending: Cardiovascular Disease | Admitting: Cardiovascular Disease

## 2023-02-02 VITALS — BP 126/70 | HR 88 | Ht 67.0 in | Wt 336.0 lb

## 2023-02-02 DIAGNOSIS — I471 Supraventricular tachycardia, unspecified: Secondary | ICD-10-CM | POA: Diagnosis not present

## 2023-02-02 DIAGNOSIS — I4819 Other persistent atrial fibrillation: Secondary | ICD-10-CM | POA: Diagnosis not present

## 2023-02-02 NOTE — Patient Instructions (Signed)
Medication Instructions:  Your physician recommends that you continue on your current medications as directed. Please refer to the Current Medication list given to you today. *If you need a refill on your cardiac medications before your next appointment, please call your pharmacy*   Follow-Up: At [facility], you and your health needs are our priority.  As part of our continuing mission to provide you with exceptional heart care, we have created designated Provider Care Teams.  These Care Teams include your primary Cardiologist (physician) and Advanced Practice Providers (APPs -  Physician Assistants and Nurse Practitioners) who all work together to provide you with the care you need, when you need it.  We recommend signing up for the patient portal called "MyChart".  Sign up information is provided on this After Visit Summary.  MyChart is used to connect with patients for Virtual Visits (Telemedicine).  Patients are able to view lab/test results, encounter notes, upcoming appointments, etc.  Non-urgent messages can be sent to your provider as well.   To learn more about what you can do with MyChart, go to [email or web address].    Your next appointment:   3 week(s)  Provider:   York Pellant, MD

## 2023-02-02 NOTE — Progress Notes (Signed)
Electrophysiology Office Note:    Date:  02/02/2023   ID:  Jennean, Guillet [birth year], MRN [id number]  PCP:  Claiborne Rigg, NP   [facility] Providers Cardiologist:  Olga Millers, MD Cardiology APP:  Ronney Asters, NP     Referring MD: Tana Felts*   History of Present Illness:    [name] is a 57 y.o. 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  He had ventricular fibrillation during the rotation requiring defibrillation.    Her brother had sudden cardiac death at age 16.  She reports that he was a heavy smoker.  I was able to locate his records and briefly review his hospital admission and course when his death occurred.  His medical record number is [id number], [name].  He was found by a friend in his car at a gas station.  Bystanders were unable to remove him from the car and perform CPR.  When EMS arrived, his initial rhythm was asystole.  His troponin was greater than 27,000.  Echocardiogram was limited by poor acoustic windows, and motion abnormality was not assessed.  His EKGs were abnormal in a nonspecific way, but there were no evidence of ARVC or acute MI.  She, the patient, has had multiple admissions for acute diastolic CHF.  She has severe LVH and diastolic dysfunction.  CMR in December 2023 showed severe basal septal hypertrophy with a dimension of 17 mm.  Maximal dimension of the LV is not mentioned. Abnormal There was severe left atrial enlargement.  She has had gene testing.  I reviewed the [employer] gene report. She has a pathogenic variant of PKP2. This particular mutation has not been documented in population databases.  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 These mutations were found incidentally during screening for infiltrative disease.  7 day ziopatch in Feb 2024 showed 100% burden of atrial fibrillation.  Today, the patient reports that she is doing well.  She denies ever having syncope though she has had loss of consciousness due to diabetic coma in the past.    Past Medical History:  Diagnosis Date   Acute on chronic diastolic congestive heart failure (HCC) 11/02/2013   10/03/2015, 11/13/2015, 08/03/2017   Atrial fibrillation (HCC)    Benign essential HTN 11/28/2013   Bipolar disease, chronic (HCC)    Chest pain    a. 2012 Myoview: EF 63%, no isch/infarct;  b. 04/2016 Lexiscan MV: EF 73%, no ischemia/infarct-->Low risk.   Chronic diastolic CHF (congestive heart failure) (HCC) 07/23/2011   a. 2015 Echo: EF 55-60%, Gr2 DD;  b. 09/2015 Echo: EF 60-65%, no rwma, mod dil LA, PASP .   Cor pulmonale (chronic) (HCC)    History of thyrotoxicosis    HTN (hypertension) 11/28/2013   Hypertensive heart disease 10/18/2013   Hypoglycemia    Insulin dependent type 2 diabetes mellitus, uncontrolled    Mediastinal adenopathy    Morbid obesity due to excess calories (HCC) 02/19/2011   Morbid obesity with BMI of 50.0-59.9, adult (HCC)    OSA (  obstructive sleep apnea) 03/06/2011   Persistent atrial fibrillation (HCC) 12/09/2017   Pulmonary HTN, moderate to severe 11/03/2013   Sinusitis, chronic 01/02/2015   SVT (supraventricular tachycardia) 12/06/2013   Uncontrolled type 2 diabetes mellitus with hyperglycemia [facility])     Past Surgical History:  Procedure Laterality Date   CARDIOVERSION N/A 04/05/2018   Procedure: CARDIOVERSION;  Surgeon: Lewayne Bunting, MD;  Location: [facility] ENDOSCOPY;  Service: Cardiovascular;  Laterality: N/A;    COLONOSCOPY WITH PROPOFOL Left 07/16/2018   Procedure: COLONOSCOPY WITH PROPOFOL;  Surgeon: Kerin Salen, MD;  Location: [facility] ENDOSCOPY;  Service: Gastroenterology;  Laterality: Left;   LEFT HEART CATH AND CORONARY ANGIOGRAPHY N/A 08/04/2018   Procedure: LEFT HEART CATH AND CORONARY ANGIOGRAPHY;  Surgeon: Lyn Records, MD;  Location: [facility] INVASIVE CV LAB;  Service: Cardiovascular;  Laterality: N/A;   None     POLYPECTOMY  07/16/2018   Procedure: POLYPECTOMY;  Surgeon: Kerin Salen, MD;  Location: [facility] ENDOSCOPY;  Service: Gastroenterology;;    Current Medications: Current Meds  Medication Sig   Accu-Chek Softclix Lancets lancets Use 2 (two) times daily.   amLODipine (NORVASC) 5 MG tablet Take 1 tablet (5 mg total) by mouth daily.   atorvastatin (LIPITOR) 10 MG tablet Take 1 tablet (10 mg total) by mouth daily.   Blood Glucose Monitoring Suppl (ACCU-CHEK GUIDE) w/Device KIT use to check blood sugar   carvedilol (COREG) 25 MG tablet Take 1 tablet (25 mg total) by mouth 2 (two) times daily with a meal.   glipiZIDE (GLUCOTROL XL) 10 MG 24 hr tablet Take 1 tablet (10 mg total) by mouth daily with breakfast.   glucose blood (TRUE METRIX BLOOD GLUCOSE TEST) test strip Use as instructed   hydrALAZINE (APRESOLINE) 25 MG tablet Take 1 tablet (25 mg total) by mouth 3 (three) times daily. (Patient taking differently: Take 25 mg by mouth 2 (two) times daily at 10 AM and 5 PM.)   hydrOXYzine (VISTARIL) 25 MG capsule Take 1 capsule (25 mg total) by mouth every 8 (eight) hours as needed. (Patient taking differently: Take 25 mg by mouth daily.)   Incontinence Supply Disposable (COMFORT SHIELD ADULT DIAPERS) MISC Please provide with sixe 2-3xl adult diapers. N39.3   Incontinence Supply Disposable (INCONTINENCE BRIEF LARGE) MISC Please provide patient with insurance approved incontinence supplies/briefs   metFORMIN (GLUCOPHAGE) 1000 MG tablet Take 1 tablet (1,000 mg total) by mouth 2 (two) times daily with a meal.    methocarbamol (ROBAXIN) 500 MG tablet Take 2 tablets (1,000 mg total) by mouth every 8 (eight) hours as needed for muscle spasms.   risperiDONE (RISPERDAL) 2 MG tablet Take 1 tablet (2 mg total) by mouth at bedtime.   rivaroxaban (XARELTO) 20 MG TABS tablet Take 1 tablet (20 mg total) by mouth daily with supper.   sacubitril-valsartan (ENTRESTO) 97-103 MG Take 1 tablet by mouth 2 (two) times daily.   Semaglutide,0.25 or 0.5MG /DOS, (OZEMPIC, 0.25 OR 0.5 MG/DOSE,) 2 MG/3ML SOPN Inject 0.25 mg into the skin once a week.   spironolactone (ALDACTONE) 25 MG tablet Take 1 tablet (25 mg total) by mouth daily.   torsemide (DEMADEX) 20 MG tablet Take 4 tablets (80 mg total) by mouth daily. (Patient taking differently: Patient takes 2 tablets by mouth in the morning and evening.)     Allergies:   Acetaminophen, Caffeine, Farxiga [dapagliflozin], Pork-derived products, and Lisinopril   Social and Family History: Reviewed in Epic  ROS:   Please see the history of present illness.    All  other systems reviewed and are negative.  EKGs/Labs/Other Studies Reviewed Today:    Echocardiogram:  TTE 05/06/22 Ejection fraction 60 to 65%.  No regional wall motion abnormalities.  Severe LVH.  Normal RV systolic function.  Left atrial size is severely dilated.   Monitors:   Stress testing:   Advanced imaging:  CMR 08/31/2022 Severe left atrial enlargement with moderate right atrial enlargement Severe basal septal hypertrophy, 17 mm supple thickness LVEF is normal, 64% without regional wall motion abnormalities Normal RV size and function, RVEF 51% Abnormal delayed gadolinium images with mottled uptake in apex and septum and a more defined area of moderate uptake in mid inferior wall  Cardiac catherization  08/04/2018 Widely patent coronary arteries, anomalous origin of the right coronary from the left sinus of Valsalva, courses between PA and AO.  Mild dynamic compression noted Ventricular  fibrillation occurred during the study, treated with defibrillation  EKG:  Last EKG results: today - atrial fibrillation   Recent Labs: 05/27/2022: Magnesium 2.0 11/25/2022: Hemoglobin 13.3; Platelets 207 12/22/2022: ALT 12 01/19/2023: B Natriuretic Peptide 613.0 01/29/2023: BUN 29; Creatinine, Ser 1.33; Potassium 4.1; Sodium 139     Physical Exam:    VS:  BP 126/70   Pulse 88   Ht 5\' 7"  (1.702 m)   Wt (!) 336 lb (152.4 kg)   SpO2 97%   BMI 52.63 kg/m     Wt Readings from Last 3 Encounters:  02/02/23 (!) 336 lb (152.4 kg)  01/29/23 (!) 349 lb (158.3 kg)  12/31/22 (!) 340 lb 3.2 oz (154.3 kg)     GEN: Well nourished, well developed in no acute distress; morbidly obese CARDIAC: RRR, no murmurs, rubs, gallops RESPIRATORY:  Normal work of breathing MUSCULOSKELETAL: no edema    ASSESSMENT & PLAN:    Risk of sudden cardiac death Though she has a genetic abnormality involving a sequence that has been implicated in ARVC, she does not meet criteria for ARVC. She has a mutation in a gene for which some mutations have been linked to CPVT. Suspicion is low that she may have CPVT; we would manage this with betablocker.  History of VF arrest Occurred during coronary catheterization, would consider this a reversible cause   History of sudden cardiac death in brother at age 64 I reviewed the medical records of the deceased in detail. I cannot find compelling evidence that he died from a heritable cause of sudden cardiac such as HCM or a channelopathy.  Atrial fibrillation Given obesity, atrial size, cardiomyopathy. I think the likelihood that she will have durable return to sinus rhythm is low.  Continue xarelto 20   I spent more than 70 minutes on this visit, reviewing the medical records of two different patients with severe, life-threatening disease. I also discussed with case with Dr. Graciela Husbands.        Medication Adjustments/Labs and Tests Ordered: Current medicines are  reviewed at length with the patient today.  Concerns regarding medicines are outlined above.  Orders Placed This Encounter  Procedures   EKG 12-Lead   No orders of the defined types were placed in this encounter.    Signed, Maurice Small, MD  02/02/2023 5:50 PM    [facility]

## 2023-02-26 ENCOUNTER — Other Ambulatory Visit: Payer: Self-pay | Admitting: Physician Assistant

## 2023-02-26 ENCOUNTER — Ambulatory Visit: Payer: Medicaid Other | Attending: Cardiovascular Disease | Admitting: Cardiovascular Disease

## 2023-02-26 ENCOUNTER — Other Ambulatory Visit: Payer: Self-pay

## 2023-02-26 ENCOUNTER — Encounter: Payer: Self-pay | Admitting: Cardiovascular Disease

## 2023-02-26 ENCOUNTER — Other Ambulatory Visit: Payer: Self-pay | Admitting: Cardiovascular Disease

## 2023-02-26 VITALS — BP 114/76 | HR 95 | Ht 67.0 in | Wt 338.4 lb

## 2023-02-26 DIAGNOSIS — I4819 Other persistent atrial fibrillation: Secondary | ICD-10-CM

## 2023-02-26 MED FILL — Rivaroxaban Tab 20 MG: ORAL | 30 days supply | Qty: 30 | Fill #0 | Status: AC

## 2023-02-26 NOTE — Telephone Encounter (Signed)
Prescription refill request for Xarelto received.  Indication: Afib  Last office visit: 02/26/23 (Mealor)  Weight: 153.5kg Age: 57 Scr: 1.33 (01/29/23)  CrCl: 115.32ml/min  Appropriate dose. Refill sent.

## 2023-02-26 NOTE — Patient Instructions (Signed)
Medication Instructions:  Your physician recommends that you continue on your current medications as directed. Please refer to the Current Medication list given to you today. *If you need a refill on your cardiac medications before your next appointment, please call your pharmacy*   Follow-Up: At [facility], you and your health needs are our priority.  As part of our continuing mission to provide you with exceptional heart care, we have created designated Provider Care Teams.  These Care Teams include your primary Cardiologist (physician) and Advanced Practice Providers (APPs -  Physician Assistants and Nurse Practitioners) who all work together to provide you with the care you need, when you need it.  We recommend signing up for the patient portal called "MyChart".  Sign up information is provided on this After Visit Summary.  MyChart is used to connect with patients for Virtual Visits (Telemedicine).  Patients are able to view lab/test results, encounter notes, upcoming appointments, etc.  Non-urgent messages can be sent to your provider as well.   To learn more about what you can do with MyChart, go to [email or web address].    Your next appointment:   As Needed  Provider:   Augustus Mealor, MD  

## 2023-02-26 NOTE — Progress Notes (Signed)
Electrophysiology Office Note:    Date:  02/26/2023   ID:  [name], [name] 11-11-1965, MRN [id number]  PCP:  Claiborne Rigg, NP   [facility] Providers Cardiologist:  Olga Millers, MD Cardiology APP:  Ronney Asters, NP     Referring MD: Claiborne Rigg, NP   History of Present Illness:    [name] is a 57 y.o. 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  He had ventricular fibrillation during the rotation requiring defibrillation.    Her brother had sudden cardiac death at age 73.  She reports that he was a heavy smoker.  I was able to locate his records and briefly review his hospital admission and course when his death occurred.  His medical record number is [id number], [name].  He was found by a friend in his car at a gas station.  Bystanders were unable to remove him from the car and perform CPR.  When EMS arrived, his initial rhythm was asystole.  His troponin was greater than 27,000.  Echocardiogram was limited by poor acoustic windows, and motion abnormality was not assessed.  His EKGs were abnormal in a nonspecific way, but there were no evidence of ARVC or acute MI.  She, the patient, has had multiple admissions for acute diastolic CHF.  She has severe LVH and diastolic dysfunction.  CMR in December 2023 showed severe basal septal hypertrophy with a dimension of 17 mm.  Maximal dimension of the LV is not mentioned. Abnormal There was severe left atrial enlargement.  She has had gene testing.  I reviewed the [facility] gene report. She has a pathogenic variant of PKP2. This particular mutation has not been documented in population databases.  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 These mutations were found incidentally during screening for infiltrative disease.  7 day ziopatch in Feb 2024 showed 100% burden of atrial fibrillation.  She denies ever having syncope though she has had loss of consciousness due to diabetic coma in the past.  She presents today to discuss ICD. Much of the history obtained above was from review of her sibling's record, which I was not able to perform at the time of the patient's visit. Today, she is here for follow-up. She has not had any changes in symptoms or diagnoses since our last visit.  Past Medical History:  Diagnosis Date   Acute on chronic diastolic congestive heart failure (HCC) 11/02/2013   10/03/2015, 11/13/2015, 08/03/2017   Atrial fibrillation (HCC)    Benign essential HTN 11/28/2013   Bipolar disease, chronic (HCC)    Chest pain    a. 2012 Myoview: EF 63%, no isch/infarct;  b. 04/2016 Lexiscan MV: EF 73%, no ischemia/infarct-->Low risk.   Chronic diastolic CHF (congestive heart failure) (HCC) 07/23/2011   a. 2015 Echo: EF 55-60%, Gr2 DD;  b. 09/2015 Echo: EF 60-65%, no rwma, mod dil LA, PASP .   Cor pulmonale (chronic) (HCC)    History of thyrotoxicosis    HTN (hypertension) 11/28/2013   Hypertensive heart disease 10/18/2013  Hypoglycemia    Insulin dependent type 2 diabetes mellitus, uncontrolled    Mediastinal adenopathy    Morbid obesity due to excess calories (HCC) 02/19/2011   Morbid obesity with BMI of 50.0-59.9, adult (HCC)    OSA (obstructive sleep apnea) 03/06/2011   Persistent atrial fibrillation (HCC) 12/09/2017   Pulmonary HTN, moderate to severe 11/03/2013   Sinusitis, chronic 01/02/2015   SVT (supraventricular tachycardia) 12/06/2013   Uncontrolled type 2 diabetes mellitus with  hyperglycemia [facility])     Past Surgical History:  Procedure Laterality Date   CARDIOVERSION N/A 04/05/2018   Procedure: CARDIOVERSION;  Surgeon: Lewayne Bunting, MD;  Location: [facility] ENDOSCOPY;  Service: Cardiovascular;  Laterality: N/A;   COLONOSCOPY WITH PROPOFOL Left 07/16/2018   Procedure: COLONOSCOPY WITH PROPOFOL;  Surgeon: Kerin Salen, MD;  Location: [facility] ENDOSCOPY;  Service: Gastroenterology;  Laterality: Left;   LEFT HEART CATH AND CORONARY ANGIOGRAPHY N/A 08/04/2018   Procedure: LEFT HEART CATH AND CORONARY ANGIOGRAPHY;  Surgeon: Lyn Records, MD;  Location: [facility] INVASIVE CV LAB;  Service: Cardiovascular;  Laterality: N/A;   None     POLYPECTOMY  07/16/2018   Procedure: POLYPECTOMY;  Surgeon: Kerin Salen, MD;  Location: [facility] ENDOSCOPY;  Service: Gastroenterology;;    Current Medications: Current Meds  Medication Sig   Accu-Chek Softclix Lancets lancets Use 2 (two) times daily.   amLODipine (NORVASC) 5 MG tablet Take 1 tablet (5 mg total) by mouth daily.   atorvastatin (LIPITOR) 10 MG tablet Take 1 tablet (10 mg total) by mouth daily.   Blood Glucose Monitoring Suppl (ACCU-CHEK GUIDE) w/Device KIT use to check blood sugar   carvedilol (COREG) 25 MG tablet Take 1 tablet (25 mg total) by mouth 2 (two) times daily with a meal.   glipiZIDE (GLUCOTROL XL) 10 MG 24 hr tablet Take 1 tablet (10 mg total) by mouth daily with breakfast.   glucose blood (TRUE METRIX BLOOD GLUCOSE TEST) test strip Use as instructed   hydrALAZINE (APRESOLINE) 25 MG tablet Take 1 tablet (25 mg total) by mouth 3 (three) times daily. (Patient taking differently: Take 25 mg by mouth 2 (two) times daily at 10 AM and 5 PM.)   hydrOXYzine (VISTARIL) 25 MG capsule Take 1 capsule (25 mg total) by mouth every 8 (eight) hours as needed. (Patient taking differently: Take 25 mg by mouth daily.)   Incontinence Supply Disposable (COMFORT SHIELD ADULT DIAPERS) MISC Please provide with sixe 2-3xl adult diapers. N39.3   Incontinence  Supply Disposable (INCONTINENCE BRIEF LARGE) MISC Please provide patient with insurance approved incontinence supplies/briefs   metFORMIN (GLUCOPHAGE) 1000 MG tablet Take 1 tablet (1,000 mg total) by mouth 2 (two) times daily with a meal.   risperiDONE (RISPERDAL) 2 MG tablet Take 1 tablet (2 mg total) by mouth at bedtime.   rivaroxaban (XARELTO) 20 MG TABS tablet Take 1 tablet (20 mg total) by mouth daily with supper.   sacubitril-valsartan (ENTRESTO) 97-103 MG Take 1 tablet by mouth 2 (two) times daily.   Semaglutide,0.25 or 0.5MG /DOS, (OZEMPIC, 0.25 OR 0.5 MG/DOSE,) 2 MG/3ML SOPN Inject 0.25 mg into the skin once a week.   spironolactone (ALDACTONE) 25 MG tablet Take 1 tablet (25 mg total) by mouth daily.   torsemide (DEMADEX) 20 MG tablet Take 4 tablets (80 mg total) by mouth daily. (Patient taking differently: Patient takes 2 tablets by mouth in the morning and evening.)     Allergies:   Acetaminophen, Caffeine, Farxiga [dapagliflozin], Pork-derived products, and Lisinopril   Social and Family History: Reviewed  in Epic  ROS:   Please see the history of present illness.    All other systems reviewed and are negative.  EKGs/Labs/Other Studies Reviewed Today:    Echocardiogram:  TTE 05/06/22 Ejection fraction 60 to 65%.  No regional wall motion abnormalities.  Severe LVH.  Normal RV systolic function.  Left atrial size is severely dilated.   Monitors:   Stress testing:   Advanced imaging:  CMR 08/31/2022 Severe left atrial enlargement with moderate right atrial enlargement Severe basal septal hypertrophy, 17 mm supple thickness LVEF is normal, 64% without regional wall motion abnormalities Normal RV size and function, RVEF 51% Abnormal delayed gadolinium images with mottled uptake in apex and septum and a more defined area of moderate uptake in mid inferior wall  Cardiac catherization  08/04/2018 Widely patent coronary arteries, anomalous origin of the right coronary from  the left sinus of Valsalva, courses between PA and AO.  Mild dynamic compression noted Ventricular fibrillation occurred during the study, treated with defibrillation  EKG:  Last EKG results: today - atrial fibrillation   Recent Labs: 05/27/2022: Magnesium 2.0 11/25/2022: Hemoglobin 13.3; Platelets 207 12/22/2022: ALT 12 01/19/2023: B Natriuretic Peptide 613.0 01/29/2023: BUN 29; Creatinine, Ser 1.33; Potassium 4.1; Sodium 139     Physical Exam:    VS:  BP 114/76 (BP Location: Left Arm, Patient Position: Sitting, Cuff Size: Large)   Pulse 95   Ht 5\' 7"  (1.702 m)   Wt (!) 338 lb 6.4 oz (153.5 kg)   SpO2 97%   BMI 53.00 kg/m     Wt Readings from Last 3 Encounters:  02/26/23 (!) 338 lb 6.4 oz (153.5 kg)  02/02/23 (!) 336 lb (152.4 kg)  01/29/23 (!) 349 lb (158.3 kg)     GEN: Well nourished, well developed in no acute distress; morbidly obese CARDIAC: RRR, no murmurs, rubs, gallops RESPIRATORY:  Normal work of breathing MUSCULOSKELETAL: no edema    ASSESSMENT & PLAN:    Risk of sudden cardiac death Though she has a genetic abnormality involving a sequence that has been implicated in ARVC, she does not meet criteria for ARVC. She has a mutation in a gene for which some mutations have been linked to CPVT. Suspicion is low that she may have CPVT; we would manage this with betablocker.  History of VF arrest Occurred during coronary catheterization, would consider this a reversible cause   History of sudden cardiac death in brother at age 90 I reviewed the medical records of the deceased in detail. I cannot find compelling evidence that he died from a heritable cause of sudden cardiac such as HCM or a channelopathy.  Atrial fibrillation Given obesity, atrial size, cardiomyopathy. I think the likelihood that she will have durable return to sinus rhythm is low.  Continue xarelto 20   She may follow-up with EP as needed.        Medication Adjustments/Labs and Tests  Ordered: Current medicines are reviewed at length with the patient today.  Concerns regarding medicines are outlined above.  No orders of the defined types were placed in this encounter.  No orders of the defined types were placed in this encounter.    Signed, Maurice Small, MD  02/26/2023 3:29 PM    [facility]

## 2023-03-01 ENCOUNTER — Other Ambulatory Visit: Payer: Self-pay

## 2023-03-08 ENCOUNTER — Other Ambulatory Visit: Payer: Self-pay

## 2023-03-24 ENCOUNTER — Other Ambulatory Visit: Payer: Self-pay

## 2023-03-24 ENCOUNTER — Encounter: Payer: Self-pay | Admitting: Nurse Practitioner

## 2023-03-24 ENCOUNTER — Ambulatory Visit: Payer: Medicaid Other | Attending: Nurse Practitioner | Admitting: Nurse Practitioner

## 2023-03-24 VITALS — BP 123/85 | HR 92 | Ht 67.0 in | Wt 338.0 lb

## 2023-03-24 DIAGNOSIS — I4819 Other persistent atrial fibrillation: Secondary | ICD-10-CM | POA: Diagnosis not present

## 2023-03-24 DIAGNOSIS — Z7985 Long-term (current) use of injectable non-insulin antidiabetic drugs: Secondary | ICD-10-CM

## 2023-03-24 DIAGNOSIS — E1165 Type 2 diabetes mellitus with hyperglycemia: Secondary | ICD-10-CM

## 2023-03-24 DIAGNOSIS — Z6841 Body Mass Index (BMI) 40.0 and over, adult: Secondary | ICD-10-CM

## 2023-03-24 DIAGNOSIS — Z1231 Encounter for screening mammogram for malignant neoplasm of breast: Secondary | ICD-10-CM

## 2023-03-24 DIAGNOSIS — F32A Depression, unspecified: Secondary | ICD-10-CM

## 2023-03-24 DIAGNOSIS — F419 Anxiety disorder, unspecified: Secondary | ICD-10-CM | POA: Diagnosis not present

## 2023-03-24 DIAGNOSIS — I10 Essential (primary) hypertension: Secondary | ICD-10-CM

## 2023-03-24 DIAGNOSIS — Z7984 Long term (current) use of oral hypoglycemic drugs: Secondary | ICD-10-CM

## 2023-03-24 DIAGNOSIS — E6609 Other obesity due to excess calories: Secondary | ICD-10-CM

## 2023-03-24 MED ORDER — RISPERIDONE 2 MG PO TABS
2.0000 mg | ORAL_TABLET | Freq: Every day | ORAL | 3 refills | Status: DC
Start: 2023-03-24 — End: 2023-08-22
  Filled 2023-03-24 – 2023-03-31 (×3): qty 30, 30d supply, fill #0
  Filled 2023-05-05 (×2): qty 30, 30d supply, fill #1
  Filled 2023-06-13: qty 30, 30d supply, fill #2
  Filled 2023-07-25: qty 30, 30d supply, fill #3

## 2023-03-24 MED ORDER — HYDRALAZINE HCL 25 MG PO TABS
25.0000 mg | ORAL_TABLET | Freq: Two times a day (BID) | ORAL | 1 refills | Status: DC
Start: 2023-03-24 — End: 2023-10-21
  Filled 2023-03-24: qty 180, 90d supply, fill #0
  Filled 2023-06-13: qty 180, 90d supply, fill #1

## 2023-03-24 MED ORDER — CARVEDILOL 25 MG PO TABS
25.0000 mg | ORAL_TABLET | Freq: Two times a day (BID) | ORAL | 1 refills | Status: DC
Start: 2023-03-24 — End: 2023-12-08
  Filled 2023-03-24: qty 180, 90d supply, fill #0
  Filled 2023-09-09 (×2): qty 180, 90d supply, fill #1

## 2023-03-24 MED ORDER — OZEMPIC (0.25 OR 0.5 MG/DOSE) 2 MG/3ML ~~LOC~~ SOPN
0.5000 mg | PEN_INJECTOR | SUBCUTANEOUS | 1 refills | Status: DC
Start: 2023-03-24 — End: 2023-10-21
  Filled 2023-03-24: qty 9, fill #0
  Filled 2023-03-31: qty 3, 28d supply, fill #0
  Filled 2023-04-12: qty 6, 56d supply, fill #0
  Filled 2023-06-28: qty 6, 56d supply, fill #1
  Filled 2023-08-24: qty 6, 56d supply, fill #2

## 2023-03-24 MED ORDER — METFORMIN HCL 1000 MG PO TABS
1000.0000 mg | ORAL_TABLET | Freq: Two times a day (BID) | ORAL | 1 refills | Status: DC
Start: 2023-03-24 — End: 2023-10-21
  Filled 2023-03-24: qty 180, 90d supply, fill #0
  Filled 2023-07-25: qty 180, 90d supply, fill #1

## 2023-03-24 MED ORDER — SPIRONOLACTONE 25 MG PO TABS
25.0000 mg | ORAL_TABLET | Freq: Every day | ORAL | 3 refills | Status: DC
Start: 2023-03-24 — End: 2023-08-22
  Filled 2023-03-24: qty 30, 30d supply, fill #0
  Filled 2023-05-05 (×2): qty 30, 30d supply, fill #1
  Filled 2023-06-13: qty 30, 30d supply, fill #2
  Filled 2023-07-25: qty 30, 30d supply, fill #3

## 2023-03-24 MED ORDER — GLIPIZIDE ER 10 MG PO TB24
10.0000 mg | ORAL_TABLET | Freq: Every day | ORAL | 1 refills | Status: DC
Start: 2023-03-24 — End: 2023-12-08
  Filled 2023-03-24 – 2023-05-05 (×3): qty 90, 90d supply, fill #0
  Filled 2023-06-13 – 2023-09-09 (×3): qty 90, 90d supply, fill #1

## 2023-03-24 NOTE — Progress Notes (Signed)
Assessment & Plan:  [name] was seen today for hypertension.  Diagnoses and all orders for this visit:  Primary hypertension -     spironolactone (ALDACTONE) 25 MG tablet; Take 1 tablet (25 mg total) by mouth daily. -     hydrALAZINE (APRESOLINE) 25 MG tablet; Take 1 tablet (25 mg total) by mouth 2 (two) times daily at 10 AM and 5 PM. -     glipiZIDE (GLUCOTROL XL) 10 MG 24 hr tablet; Take 1 tablet (10 mg total) by mouth daily with breakfast. -     carvedilol (COREG) 25 MG tablet; Take 1 tablet (25 mg total) by mouth 2 (two) times daily with a meal.  Type 2 diabetes mellitus with hyperglycemia, without long-term current use of insulin (HCC) -     Ambulatory referral to Ophthalmology -     Semaglutide,0.25 or 0.5MG /DOS, (OZEMPIC, 0.25 OR 0.5 MG/DOSE,) 2 MG/3ML SOPN; Inject 0.5 mg into the skin once a week. -     metFORMIN (GLUCOPHAGE) 1000 MG tablet; Take 1 tablet (1,000 mg total) by mouth 2 (two) times daily with a meal. -     CMP14+EGFR -     Hemoglobin A1c  Anxiety and depression -     risperiDONE (RISPERDAL) 2 MG tablet; Take 1 tablet (2 mg total) by mouth at bedtime.  Persistent atrial fibrillation F/u with cardiology as scheduled -     carvedilol (COREG) 25 MG tablet; Take 1 tablet (25 mg total) by mouth 2 (two) times daily with a meal.  Breast cancer screening by mammogram -     MM 3D SCREENING MAMMOGRAM BILATERAL BREAST; Future    Patient has been counseled on age-appropriate routine health concerns for screening and prevention. These are reviewed and up-to-date. Referrals have been placed accordingly. Immunizations are up-to-date or declined.    Subjective:   Chief Complaint  Patient presents with   Hypertension   HPI [name] 1 57 y.o. female presents to office today for follow up to HTN  PMH significant for:  AonC diastolic congestive heart failure (11/02/2013), Benign essential HTN (11/28/2013), Bipolar disease, chronic, Cor pulmonale, History of thyrotoxicosis,  DM2,  Mediastinal adenopathy,  Morbid obesity with BMI >50, OSA (obstructive sleep apnea) DOES NOT USE BIPAP,   Persistent Afib on xarelto (12/09/2017), Pulmonary HTN, moderate to severe (11/03/2013),  SVT (12/06/2013)   HTN Blood pressure is well controlled. She endorses adherence with taking her medications as follows: carvedilol 25 mg BID, taking hydralazine 25 mg BID and not TID, entresto 97-103 mg BID, spironolactone 25 mg daily, amlodipine 5 mg daily and torsemide 60 mg daily. BP Readings from Last 3 Encounters:  03/24/23 123/85  02/26/23 114/76  02/02/23 126/70     OSA on CPAP She states she does not like the way her mask fits on her face. It is causing her not to be able to fall asleep and she does not feel she is getting enough oxygen at night with her current mask. She has been using her CPAP machine for 2-3 years now.    Review of Systems  Constitutional:  Negative for fever, malaise/fatigue and weight loss.  HENT: Negative.  Negative for nosebleeds.   Eyes: Negative.  Negative for blurred vision, double vision and photophobia.  Respiratory: Negative.  Negative for cough and shortness of breath.   Cardiovascular: Negative.  Negative for chest pain, palpitations and leg swelling.  Gastrointestinal: Negative.  Negative for heartburn, nausea and vomiting.  Musculoskeletal: Negative.  Negative for myalgias.  Neurological:  Negative.  Negative for dizziness, focal weakness, seizures and headaches.  Psychiatric/Behavioral: Negative.  Negative for suicidal ideas.     Past Medical History:  Diagnosis Date   Acute on chronic diastolic congestive heart failure (HCC) 11/02/2013   10/03/2015, 11/13/2015, 08/03/2017   Atrial fibrillation (HCC)    Benign essential HTN 11/28/2013   Bipolar disease, chronic (HCC)    Chest pain    a. 2012 Myoview: EF 63%, no isch/infarct;  b. 04/2016 Lexiscan MV: EF 73%, no ischemia/infarct-->Low risk.   Chronic diastolic CHF (congestive heart failure) (HCC)  07/23/2011   a. 2015 Echo: EF 55-60%, Gr2 DD;  b. 09/2015 Echo: EF 60-65%, no rwma, mod dil LA, PASP .   Cor pulmonale (chronic) (HCC)    History of thyrotoxicosis    HTN (hypertension) 11/28/2013   Hypertensive heart disease 10/18/2013   Hypoglycemia    Insulin dependent type 2 diabetes mellitus, uncontrolled    Mediastinal adenopathy    Morbid obesity due to excess calories (HCC) 02/19/2011   Morbid obesity with BMI of 50.0-59.9, adult (HCC)    OSA (obstructive sleep apnea) 03/06/2011   Persistent atrial fibrillation (HCC) 12/09/2017   Pulmonary HTN, moderate to severe 11/03/2013   Sinusitis, chronic 01/02/2015   SVT (supraventricular tachycardia) 12/06/2013   Uncontrolled type 2 diabetes mellitus with hyperglycemia [facility])     Past Surgical History:  Procedure Laterality Date   CARDIOVERSION N/A 04/05/2018   Procedure: CARDIOVERSION;  Surgeon: Lewayne Bunting, MD;  Location: [facility] ENDOSCOPY;  Service: Cardiovascular;  Laterality: N/A;   COLONOSCOPY WITH PROPOFOL Left 07/16/2018   Procedure: COLONOSCOPY WITH PROPOFOL;  Surgeon: Kerin Salen, MD;  Location: [facility] ENDOSCOPY;  Service: Gastroenterology;  Laterality: Left;   LEFT HEART CATH AND CORONARY ANGIOGRAPHY N/A 08/04/2018   Procedure: LEFT HEART CATH AND CORONARY ANGIOGRAPHY;  Surgeon: Lyn Records, MD;  Location: [facility] INVASIVE CV LAB;  Service: Cardiovascular;  Laterality: N/A;   None     POLYPECTOMY  07/16/2018   Procedure: POLYPECTOMY;  Surgeon: Kerin Salen, MD;  Location: [facility] ENDOSCOPY;  Service: Gastroenterology;;    Family History  Problem Relation Age of Onset   Heart failure Father    Stroke Father    Hypertension Mother    Heart disease Maternal Grandfather     Social History Reviewed with no changes to be made today.   Outpatient Medications Prior to Visit  Medication Sig Dispense Refill   Accu-Chek Softclix Lancets lancets Use 2 (two) times daily. 100 each 1   amLODipine (NORVASC) 5 MG tablet Take 1 tablet (5  mg total) by mouth daily. 90 tablet 3   Blood Glucose Monitoring Suppl (ACCU-CHEK GUIDE) w/Device KIT use to check blood sugar 1 kit 0   glucose blood (TRUE METRIX BLOOD GLUCOSE TEST) test strip Use as instructed 100 each 12   hydrOXYzine (VISTARIL) 25 MG capsule Take 1 capsule (25 mg total) by mouth every 8 (eight) hours as needed. (Patient taking differently: Take 25 mg by mouth daily.) 60 capsule 1   Incontinence Supply Disposable (COMFORT SHIELD ADULT DIAPERS) MISC Please provide with sixe 2-3xl adult diapers. N39.3 48 each 12   Incontinence Supply Disposable (INCONTINENCE BRIEF LARGE) MISC Please provide patient with insurance approved incontinence supplies/briefs 18 each 6   methocarbamol (ROBAXIN) 500 MG tablet Take 2 tablets (1,000 mg total) by mouth every 8 (eight) hours as needed for muscle spasms. 90 tablet 0   rivaroxaban (XARELTO) 20 MG TABS tablet Take 1 tablet (20 mg total) by mouth daily  with supper. 30 tablet 5   sacubitril-valsartan (ENTRESTO) 97-103 MG Take 1 tablet by mouth 2 (two) times daily. 60 tablet 10   torsemide (DEMADEX) 20 MG tablet Take 4 tablets (80 mg total) by mouth daily. (Patient taking differently: Patient takes 2 tablets by mouth in the morning and evening.) 120 tablet 5   carvedilol (COREG) 25 MG tablet Take 1 tablet (25 mg total) by mouth 2 (two) times daily with a meal. 60 tablet 3   glipiZIDE (GLUCOTROL XL) 10 MG 24 hr tablet Take 1 tablet (10 mg total) by mouth daily with breakfast. 90 tablet 0   hydrALAZINE (APRESOLINE) 25 MG tablet Take 1 tablet (25 mg total) by mouth 3 (three) times daily. (Patient taking differently: Take 25 mg by mouth 2 (two) times daily at 10 AM and 5 PM.) 90 tablet 3   metFORMIN (GLUCOPHAGE) 1000 MG tablet Take 1 tablet (1,000 mg total) by mouth 2 (two) times daily with a meal. 60 tablet 3   risperiDONE (RISPERDAL) 2 MG tablet Take 1 tablet (2 mg total) by mouth at bedtime. 30 tablet 3   Semaglutide,0.25 or 0.5MG /DOS, (OZEMPIC, 0.25 OR  0.5 MG/DOSE,) 2 MG/3ML SOPN Inject 0.25 mg into the skin once a week. 9 mL 1   spironolactone (ALDACTONE) 25 MG tablet Take 1 tablet (25 mg total) by mouth daily. 30 tablet 3   atorvastatin (LIPITOR) 10 MG tablet Take 1 tablet (10 mg total) by mouth daily. (Patient not taking: Reported on 03/24/2023) 90 tablet 3   No facility-administered medications prior to visit.    Allergies  Allergen Reactions   Acetaminophen Other (See Comments)    Seizure-like "fits" as a child   Caffeine     Tense, anxiety, increased urination   [place] [Dapagliflozin] Other (See Comments)    Hallucinations, drop in blood sugar   Pork-Derived Products Other (See Comments)    Religious reasons. Patient verified on 1/26 okay to get heparin products if necessary   Lisinopril Rash    Rash with lisinopril; but fosinopril is ok per patient       Objective:    BP 123/85 (BP Location: Left Arm, Patient Position: Sitting, Cuff Size: Large)   Pulse 92   Ht 5\' 7"  (1.702 m)   Wt (!) 338 lb (153.3 kg)   SpO2 98%   BMI 52.94 kg/m  Wt Readings from Last 3 Encounters:  03/24/23 (!) 338 lb (153.3 kg)  02/26/23 (!) 338 lb 6.4 oz (153.5 kg)  02/02/23 (!) 336 lb (152.4 kg)    The ASCVD Risk score (Arnett DK, et al., 2019) failed to calculate for the following reasons:   The patient has a prior MI or stroke diagnosis    Physical Exam Vitals and nursing note reviewed.  Constitutional:      Appearance: She is well-developed.  HENT:     Head: Normocephalic and atraumatic.  Cardiovascular:     Rate and Rhythm: Normal rate. Rhythm irregular.     Heart sounds: Normal heart sounds. No murmur heard.    No friction rub. No gallop.  Pulmonary:     Effort: Pulmonary effort is normal. No tachypnea or respiratory distress.     Breath sounds: Normal breath sounds. No decreased breath sounds, wheezing, rhonchi or rales.  Chest:     Chest wall: No tenderness.  Abdominal:     General: Bowel sounds are normal.      Palpations: Abdomen is soft.  Musculoskeletal:  General: Normal range of motion.     Cervical back: Normal range of motion.  Skin:    General: Skin is warm and dry.  Neurological:     Mental Status: She is alert and oriented to person, place, and time.     Coordination: Coordination normal.  Psychiatric:        Behavior: Behavior normal. Behavior is cooperative.        Thought Content: Thought content normal.        Judgment: Judgment normal.          Patient has been counseled extensively about nutrition and exercise as well as the importance of adherence with medications and regular follow-up. The patient was given clear instructions to go to ER or return to medical center if symptoms don't improve, worsen or new problems develop. The patient verbalized understanding.   Follow-up: Return in about 3 months (around 06/24/2023).   Claiborne Rigg, FNP-BC [facility] [place], [place] [phone]   03/24/2023, 2:03 PM

## 2023-03-25 LAB — CMP14+EGFR
ALT: 12 IU/L (ref 0–32)
AST: 22 IU/L (ref 0–40)
Albumin: 4.4 g/dL (ref 3.8–4.9)
Alkaline Phosphatase: 122 IU/L — ABNORMAL HIGH (ref 44–121)
BUN/Creatinine Ratio: 27 — ABNORMAL HIGH (ref 9–23)
BUN: 38 mg/dL — ABNORMAL HIGH (ref 6–24)
Bilirubin Total: 0.3 mg/dL (ref 0.0–1.2)
CO2: 24 mmol/L (ref 20–29)
Calcium: 9.8 mg/dL (ref 8.7–10.2)
Chloride: 101 mmol/L (ref 96–106)
Creatinine, Ser: 1.4 mg/dL — ABNORMAL HIGH (ref 0.57–1.00)
Globulin, Total: 3.4 g/dL (ref 1.5–4.5)
Glucose: 112 mg/dL — ABNORMAL HIGH (ref 70–99)
Potassium: 4.5 mmol/L (ref 3.5–5.2)
Sodium: 139 mmol/L (ref 134–144)
Total Protein: 7.8 g/dL (ref 6.0–8.5)
eGFR: 44 mL/min/{1.73_m2} — ABNORMAL LOW (ref >59)

## 2023-03-25 LAB — HEMOGLOBIN A1C
Est. average glucose Bld gHb Est-mCnc: 128 mg/dL
Hgb A1c MFr Bld: 6.1 % — ABNORMAL HIGH (ref 4.8–5.6)

## 2023-03-26 ENCOUNTER — Ambulatory Visit: Payer: Medicaid Other

## 2023-03-30 ENCOUNTER — Other Ambulatory Visit: Payer: Self-pay

## 2023-03-30 MED FILL — Rivaroxaban Tab 20 MG: ORAL | 30 days supply | Qty: 30 | Fill #1 | Status: AC

## 2023-03-31 ENCOUNTER — Other Ambulatory Visit: Payer: Self-pay

## 2023-04-12 ENCOUNTER — Other Ambulatory Visit: Payer: Self-pay

## 2023-04-19 ENCOUNTER — Ambulatory Visit: Payer: Medicaid Other

## 2023-04-19 ENCOUNTER — Other Ambulatory Visit: Payer: Self-pay

## 2023-05-05 ENCOUNTER — Other Ambulatory Visit: Payer: Self-pay

## 2023-05-05 MED FILL — Rivaroxaban Tab 20 MG: ORAL | 30 days supply | Qty: 30 | Fill #2 | Status: AC

## 2023-05-05 MED FILL — Rivaroxaban Tab 20 MG: ORAL | 30 days supply | Qty: 30 | Fill #2 | Status: CN

## 2023-05-06 ENCOUNTER — Other Ambulatory Visit: Payer: Self-pay

## 2023-05-10 ENCOUNTER — Other Ambulatory Visit: Payer: Self-pay

## 2023-05-11 ENCOUNTER — Encounter (HOSPITAL_COMMUNITY): Payer: Self-pay | Admitting: Cardiology

## 2023-05-11 ENCOUNTER — Ambulatory Visit (HOSPITAL_COMMUNITY)
Admission: RE | Admit: 2023-05-11 | Discharge: 2023-05-11 | Disposition: A | Discharge status: Home / Self Care | Payer: Medicaid Other | Source: Ambulatory Visit | Attending: Cardiology | Admitting: Cardiology

## 2023-05-11 VITALS — BP 124/86 | HR 107 | Wt 342.4 lb

## 2023-05-11 DIAGNOSIS — G4733 Obstructive sleep apnea (adult) (pediatric): Secondary | ICD-10-CM | POA: Diagnosis not present

## 2023-05-11 DIAGNOSIS — I4819 Other persistent atrial fibrillation: Secondary | ICD-10-CM | POA: Insufficient documentation

## 2023-05-11 DIAGNOSIS — Z7901 Long term (current) use of anticoagulants: Secondary | ICD-10-CM | POA: Diagnosis present

## 2023-05-11 DIAGNOSIS — I110 Hypertensive heart disease with heart failure: Secondary | ICD-10-CM | POA: Diagnosis not present

## 2023-05-11 DIAGNOSIS — I5033 Acute on chronic diastolic (congestive) heart failure: Secondary | ICD-10-CM | POA: Diagnosis not present

## 2023-05-11 DIAGNOSIS — Z91199 Patient's noncompliance with other medical treatment and regimen due to unspecified reason: Secondary | ICD-10-CM | POA: Insufficient documentation

## 2023-05-11 DIAGNOSIS — I5032 Chronic diastolic (congestive) heart failure: Secondary | ICD-10-CM

## 2023-05-11 LAB — CBC
HCT: 37.5 % (ref 36.0–46.0)
Hemoglobin: 11.7 g/dL — ABNORMAL LOW (ref 12.0–15.0)
MCH: 27.4 pg (ref 26.0–34.0)
MCHC: 31.2 g/dL (ref 30.0–36.0)
MCV: 87.8 fL (ref 80.0–100.0)
Platelets: 187 10*3/uL (ref 150–400)
RBC: 4.27 MIL/uL (ref 3.87–5.11)
RDW: 13.5 % (ref 11.5–15.5)
WBC: 4.8 10*3/uL (ref 4.0–10.5)
nRBC: 0 % (ref 0.0–0.2)

## 2023-05-11 LAB — BASIC METABOLIC PANEL
Anion gap: 12 (ref 5–15)
BUN: 34 mg/dL — ABNORMAL HIGH (ref 6–20)
CO2: 23 mmol/L (ref 22–32)
Calcium: 9.2 mg/dL (ref 8.9–10.3)
Chloride: 103 mmol/L (ref 98–111)
Creatinine, Ser: 1.59 mg/dL — ABNORMAL HIGH (ref 0.44–1.00)
GFR, Estimated: 38 mL/min — ABNORMAL LOW (ref >60)
Glucose, Bld: 193 mg/dL — ABNORMAL HIGH (ref 70–99)
Potassium: 4.5 mmol/L (ref 3.5–5.1)
Sodium: 138 mmol/L (ref 135–145)

## 2023-05-11 LAB — BRAIN NATRIURETIC PEPTIDE: B Natriuretic Peptide: 385.7 pg/mL — ABNORMAL HIGH (ref 0.0–100.0)

## 2023-05-11 NOTE — Progress Notes (Signed)
ReDS Vest / Clip - 05/11/23 1500       ReDS Vest / Clip   Station Marker D    Ruler Value 35    ReDS Value Range Low volume    ReDS Actual Value 33

## 2023-05-11 NOTE — Patient Instructions (Signed)
   Labs done today, your results will be available in MyChart, we will contact you for abnormal readings.  Your physician recommends that you schedule a follow-up appointment in: 2 months.  If you have any questions or concerns before your next appointment please send [place] a message through [place] or call our office at [phone].    TO LEAVE A MESSAGE FOR THE NURSE SELECT OPTION 2, PLEASE LEAVE A MESSAGE INCLUDING: YOUR NAME DATE OF BIRTH CALL BACK NUMBER REASON FOR CALL**this is important as we prioritize the call backs  YOU WILL RECEIVE A CALL BACK THE SAME DAY AS LONG AS YOU CALL BEFORE 4:00 PM  At the [facility], you and your health needs are our priority. As part of our continuing mission to provide you with exceptional heart care, we have created designated Provider Care Teams. These Care Teams include your primary Cardiologist (physician) and Advanced Practice Providers (APPs- Physician Assistants and Nurse Practitioners) who all work together to provide you with the care you need, when you need it.   You may see any of the following providers on your designated Care Team at your next follow up: Dr Arvilla Meres Dr Marca Ancona Dr. Marcos Eke, NP Robbie Lis, [place] [facility] [place], [place] Brynda Peon, NP Karle Plumber, PharmD   Please be sure to bring in all your medications bottles to every appointment.    Thank you for choosing [facility]

## 2023-05-12 NOTE — Progress Notes (Signed)
[facility]  Note  PCP: Claiborne Rigg, NP HF Cardiologist: Dr. Shirlee Latch  HPI: Pt is a 57 y.o.female with a history of chronic diastolic heart failure, persistent atrial fibrillation on Xarelto, hypertension, obesity and OSA, not compliant w/ CPAP.  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  She had VFib with heart cath, treated w/ defibrillation to NSR. She also developed AKI after cath.  Echo 04/18/21 with EF 60-65%, no RWMA, severe LVH, RV normal with moderately elevated PA systolic pressure, Lt atrial size severely dilated and RA mildly dilated.  Mild MR, mod to severe TR.   Of note, brother had sudden cardiac death around age 109 and father and grandmother had CHF.    Admitted 11/10/21 for a/c diastolic CHF w/ fluid overload. Afib well rate controlled. Diuresed w/ IV Lasix, then transitioned to GDMT. Discharge home, weight 328 lbs.  Admitted 05/25/22 with acute on chronic HFpEF. Ran out her medications while traveling out of the country. Diuresed with IV lasix and transitioned to torsemide 40 mg bid. Echo showed LVEF 60-65%, severe LVH, septal thickenness over 20 mm but difficult to measure, RV normal.  Discharge weight 308 pounds.   cMRI 12/23 showed severe LAE with moderate RAE, severe basal septal hypertrophy 17 mm with LVEF normal 64%, no RWMAs, normal RV size and function RVEF 51%, abnormal delayed enhancement images with mottled LGE in apex and septum and more defined area of moderate LGE in mid inferior wall, normal parametric measures.  Unlikely cardiac amyloidosis, consider sarcoidosis or HCM.  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 She also had a RYR2 gene mutation.   7 day Zio 2/24 showed atrial fibrillation occurred continuously (100% burden),  ranging from 56-133 bpm (avg of 81 bpm). Isolated VEs were rare (<1.0%, 1345), VE Couplets were rare (<1.0%, 90), and VE Triplets were rare (<1.0%).    She was seen by EP, and after discussion, it was decided that she did not meet criteria for ICD.   She returns today for evaluation of CHF.  Weight down 7 lbs.  She remains in chronic AF, rate mildly elevated today.  She walks around the house without dyspnea.  Not very active, but does get short of breath walking longer distances.  No chest pain.  She has been sleeping on the couch to prop up her head for > 6 months.  No palpitations or syncope.   REDS clip 33%  ECG (personally reviewed): Atrial fibrillation rate 100s  Labs (7/23): Myeloma panel negative Labs (9/23): K 4.2, creatinine 1.36 Labs (10/23): K 4.2, creatinine 1.34, BNP 455 Labs (12/23): BNP 843, K 4, creatinine 1.2 Labs (3/24): Scr 1.10, K 4.3  Labs (4/24): SCr 1.28, K 4.2 Labs (6/24): K 4.5, creatinine 1.4  PMH: 1. Atrial fibrillation: Chronic since 2019.  2. HTN 3. OSA: Unable to tolerate CPAP.  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   - Cardiolite (8/17): No ischemia 5. Chronic diastolic CHF: Echo (8/23): EF 09-81%, severe LVH, Septal thickenness over 20mm but difficult to measure, RV normal.   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 EF >65%.  - Chest CT 2019 Prominent mediastinal lymph nodes, likely reactive. Lungs/Pleura: +Calcified granulomas. -  Cardiac MRI (12/23): Severe LAE with moderate RAE, severe basal septal hypertrophy 17 mm with LVEF normal 64%, no RWMAs, normal RV size and function RVEF 51%, abnormal delayed enhancement images with mottled LGE in apex and septum and more defined area of moderate LGE in mid inferior wall, normal parametric measures.   - [employer] gene testing showed pathogenic variant of PKP2, associated w/ arrhythmogenic cardiomyopathy. She also  had a RYR2 gene mutation.  6. Bipolar disorder 7. 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 But ran out of money and is no longer attending school. (Note patient has bipolar disorder).      Born in [place] but lived in [place] before coming back to [place].       Primary language is Arabic. Lives with mother and brother.   Social Determinants of Health   Financial Resource Strain: Low Risk  (05/26/2022)   Overall Financial Resource Strain (CARDIA)    Difficulty of Paying Living Expenses: Not very hard  Food Insecurity: No Food Insecurity (10/23/2022)   Hunger Vital Sign    Worried About Running Out of Food in the Last Year: Never true    Ran Out of Food in the Last Year: Never true  Transportation Needs: Unmet Transportation Needs (10/23/2022)   PRAPARE - Transportation    Lack of Transportation (Medical): Yes    Lack of Transportation (Non-Medical): Yes  Physical Activity: Not on file  Stress: No Stress Concern Present (07/15/2018)   [employer] of Occupational Health - Occupational Stress Questionnaire    Feeling of Stress : Only a little  Social Connections: Not on file  Intimate Partner Violence: Not At Risk (10/23/2022)   Humiliation, Afraid, Rape, and Kick questionnaire    Fear of Current or Ex-Partner: No    Emotionally Abused: No    Physically  Abused: No    Sexually Abused: No   FH:  Brother with sudden cardiac death, father with CVA  Current Outpatient Medications  Medication Sig Dispense Refill   Accu-Chek Softclix Lancets lancets Use 2 (two) times daily. 100 each 1   amLODipine (NORVASC) 5 MG tablet Take 1 tablet (5 mg total) by mouth daily. 90 tablet 3   atorvastatin (LIPITOR) 10 MG tablet Take 1 tablet (10 mg total) by mouth daily. 90 tablet 3   Blood Glucose Monitoring Suppl (ACCU-CHEK GUIDE) w/Device KIT use to check blood sugar 1 kit 0   carvedilol (COREG) 25 MG tablet Take 1 tablet (25 mg total) by mouth 2 (two) times daily with a meal. 180 tablet 1   glipiZIDE (GLUCOTROL XL) 10 MG 24 hr tablet Take 1 tablet (10 mg total) by mouth daily with breakfast. 90 tablet 1   glucose blood (TRUE METRIX BLOOD GLUCOSE  TEST) test strip Use as instructed 100 each 12   hydrALAZINE (APRESOLINE) 25 MG tablet Take 1 tablet (25 mg total) by mouth 2 (two) times daily at 10 AM and 5 PM. 180 tablet 1   hydrOXYzine (VISTARIL) 25 MG capsule Take 1 capsule (25 mg total) by mouth every 8 (eight) hours as needed. (Patient taking differently: Take 25 mg by mouth daily.) 60 capsule 1   Incontinence Supply Disposable (COMFORT SHIELD ADULT DIAPERS) MISC Please provide with sixe 2-3xl adult diapers. N39.3 48 each 12   Incontinence Supply Disposable (INCONTINENCE BRIEF LARGE) MISC Please provide patient with insurance approved incontinence supplies/briefs 18 each 6   metFORMIN (GLUCOPHAGE) 1000 MG tablet Take 1 tablet (1,000 mg total) by mouth 2 (two) times daily with a meal. 180 tablet 1   risperiDONE (RISPERDAL) 2 MG tablet Take 1 tablet (2 mg total) by mouth at bedtime. 30 tablet 3   rivaroxaban (XARELTO) 20 MG TABS tablet Take 1 tablet (20 mg total) by mouth daily with supper. 30 tablet 5   sacubitril-valsartan (ENTRESTO) 97-103 MG Take 1 tablet by mouth 2 (two) times daily. 60 tablet 10   Semaglutide,0.25 or 0.5MG /DOS, (OZEMPIC, 0.25 OR 0.5 MG/DOSE,)  2 MG/3ML SOPN Inject 0.5 mg into the skin once a week. 9 mL 1   spironolactone (ALDACTONE) 25 MG tablet Take 1 tablet (25 mg total) by mouth daily. 30 tablet 3   torsemide (DEMADEX) 20 MG tablet Take 4 tablets (80 mg total) by mouth daily. (Patient taking differently: Patient takes 2 tablets by mouth in the morning and evening.) 120 tablet 5   No current facility-administered medications for this encounter.   BP 124/86   Pulse (!) 107   Wt (!) 155.3 kg (342 lb 6.4 oz)   SpO2 97%   BMI 53.63 kg/m   Wt Readings from Last 3 Encounters:  05/11/23 (!) 155.3 kg (342 lb 6.4 oz)  03/24/23 (!) 153.3 kg (338 lb)  02/26/23 (!) 153.5 kg (338 lb 6.4 oz)   PHYSICAL EXAM: General: NAD, obese Neck: No JVD, no thyromegaly or thyroid nodule.  Lungs: Clear to auscultation bilaterally with normal respiratory effort. CV: Nondisplaced PMI.  Heart irregular S1/S2, no S3/S4, no murmur.  No peripheral edema.  No carotid bruit.  Normal pedal pulses.  Abdomen: Soft, nontender, no hepatosplenomegaly, no distention.  Skin: Intact without lesions or rashes.  Neurologic: Alert and oriented x 3.  Psych: Normal affect. Extremities: No clubbing or cyanosis.  HEENT: Normal.    ASSESSMENT & PLAN: 1. Chronic Diastolic CHF:  Echo (8/23) with EF 60-65%, severe LVH, Septal thickenness over 20mm but difficult to measure, RV normal.   Severe LVH may be secondary to HTN, but also was concerned for possible hypertrophic cardiomyopathy.  Father/grandmother with CHF and brother with ?sudden death is concerning for familial cardiomyopathy.  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 cMRI w/ normal sized RV and function, RVEF 51%, severe basal septal hypertrophy 17 mm, LVEF normal 64% no RWMAs, abnormal LGE in con-coronary pattern with mottled uptake in apex and septum and more  defined area of moderate uptake in mid inferior wall.  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  Cardiac MRI was not suggestive of cardiac amyloidosis. Zio 2/24 showed 100% afib, no ventricular arrythmias.  No  syncope or palpiations.  NYHA class III chronically, not volume overloaded by exam or REDS clip.  Suspect obesity/deconditioning plays a role in her dyspnea.  - I referred her to EP (Dr Nelly Laurence) for ICD consideration given my suspicion for LV-predominant arrhythmogenic cardiomyopathy with extensive LGE on cMRI; However, after extensive discussion/consideration she was not thought to be a candidate for ICD.  - Continue Torsemide 40 mg BID. BMET/BNP today.  - Continue Entresto 97-103 mg bid.  - She did not tolerate Jardiance.                                                                                                                                                                  - Continue spironolactone 25 mg daily  - Continue carvedilol 25 mg bid. - Referred to Dr. Sidney Ace for genetic counseling, still needs to schedule.    - Previously discussed Cardiomems placement but she refused.   - Compliance has been an issue. Was referred to paramedicine but did not cooperate and discharged from the program  2. Hypertension: controlled on current regimen  - GDMT per above.  3. Atrial fibrillation: Chronic since at least 2019. Zio 2.24 showed 100% burden.  Unlikely to cardiovert and stay in NSR at this point given chronicity and noncompliance with CPAP.  Rate controlled.  - Continue Coreg 25 mg bid  - Continue Xarelto. CBC today.  4. OSA: Not compliant w/ CPAP.  5. Morbid Obesity: Body mass index is 53.63 kg/m.  Now on semaglutide with weight trending down.  6. DM II: Per PCP.  7. Anomalous RCA: Off the left cusp, appears to course interarterially between aorta and PA.  Cardiolite in 2017  showed no ischemia.  With an anomalous right, no attributable symptoms (no chest pain), and normal Cardiolite in past, would medically manage this.  - continue statin + ? blocker - no ASA w/ Xarelto use   F/u in 2 months with APP.   Marca Ancona,  05/12/2023

## 2023-05-27 ENCOUNTER — Telehealth (HOSPITAL_COMMUNITY): Payer: Self-pay

## 2023-05-27 ENCOUNTER — Other Ambulatory Visit (HOSPITAL_COMMUNITY): Payer: Self-pay

## 2023-05-27 NOTE — Telephone Encounter (Signed)
Patient Advocate Encounter  Received notification that patient is due to renew Xarelto assistance through [employer]. Review of chart shows that patient has current coverage. Test claim returns $4 copay for Xarelto.  Renewal forms not submitted at this time.  Burnell Blanks, CPhT Rx Patient Advocate Phone: [phone]

## 2023-06-13 MED FILL — Rivaroxaban Tab 20 MG: ORAL | 30 days supply | Qty: 30 | Fill #3 | Status: AC

## 2023-06-14 ENCOUNTER — Other Ambulatory Visit: Payer: Self-pay

## 2023-06-15 ENCOUNTER — Other Ambulatory Visit: Payer: Self-pay

## 2023-06-16 ENCOUNTER — Other Ambulatory Visit: Payer: Self-pay

## 2023-06-16 NOTE — Progress Notes (Unsigned)
Electrophysiology Office Note:    Date:  06/16/2023   ID:  [name], [name] July 11, 1966, MRN [id number]  PCP:  Vanessa Rigg, NP   [facility] Providers Cardiologist:  Vanessa Millers, MD Cardiology APP:  Vanessa Asters, NP     Referring MD: Vanessa Rigg, NP   History of Present Illness:    [name] is a 57 y.o. 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  He had ventricular fibrillation during the rotation requiring defibrillation.    Her brother had sudden cardiac death at age 49.  She reports that he was a heavy smoker.  I was able to locate his records and briefly review his hospital admission and course when his death occurred.  His medical record number is [id number], [name].  He was found by a friend in his car at a gas station.  Bystanders were unable to remove him from the car and perform CPR.  When EMS arrived, his initial rhythm was asystole.  His troponin was greater than 27,000.  Echocardiogram was limited by poor acoustic windows, and motion abnormality was not assessed.  His EKGs were abnormal in a nonspecific way, but there were no evidence of ARVC or acute MI.  She, the patient, has had multiple admissions for acute diastolic CHF.  She has severe LVH and diastolic dysfunction.  CMR in December 2023 showed severe basal septal hypertrophy with a dimension of 17 mm.  Maximal dimension of the LV is not mentioned. Abnormal There was severe left atrial enlargement.  She has had gene testing.  I reviewed the [employer] gene report. She has a pathogenic variant of PKP2. This particular mutation has not been documented in population databases.  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 These mutations were found incidentally during screening for infiltrative disease.  7 day ziopatch in Feb 2024 showed 100% burden of atrial fibrillation.  She denies ever having syncope though she has had loss of consciousness due to diabetic coma in the past.  She presents today to discuss ICD. Much of the history obtained above was from review of her sibling's record, which I was not able to perform at the time of the patient's visit. Today, she is here for follow-up. She has not had any changes in symptoms or diagnoses since our last visit.  Past Medical History:  Diagnosis Date   Acute on chronic diastolic congestive heart failure (HCC) 11/02/2013   10/03/2015, 11/13/2015, 08/03/2017   Atrial fibrillation (HCC)    Benign essential HTN 11/28/2013   Bipolar disease, chronic (HCC)    Chest pain    a. 2012 Myoview: EF 63%, no isch/infarct;  b. 04/2016 Lexiscan MV: EF 73%, no ischemia/infarct-->Low risk.   Chronic diastolic CHF (congestive heart failure) (HCC) 07/23/2011   a. 2015 Echo: EF 55-60%, Gr2 DD;  b. 09/2015 Echo: EF 60-65%, no rwma, mod dil LA, PASP .   Cor pulmonale (chronic) (HCC)    History of thyrotoxicosis    HTN (hypertension) 11/28/2013   Hypertensive heart disease 10/18/2013  Hypoglycemia    Insulin dependent type 2 diabetes mellitus, uncontrolled    Mediastinal adenopathy    Morbid obesity due to excess calories (HCC) 02/19/2011   Morbid obesity with BMI of 50.0-59.9, adult (HCC)    OSA (obstructive sleep apnea) 03/06/2011   Persistent atrial fibrillation (HCC) 12/09/2017   Pulmonary HTN, moderate to severe 11/03/2013   Sinusitis, chronic 01/02/2015   SVT (supraventricular tachycardia) 12/06/2013   Uncontrolled type 2 diabetes mellitus with  hyperglycemia [facility])     Past Surgical History:  Procedure Laterality Date   CARDIOVERSION N/A 04/05/2018   Procedure: CARDIOVERSION;  Surgeon: Vanessa Bunting, MD;  Location: [facility] ENDOSCOPY;  Service: Cardiovascular;  Laterality: N/A;   COLONOSCOPY WITH PROPOFOL Left 07/16/2018   Procedure: COLONOSCOPY WITH PROPOFOL;  Surgeon: Kerin Salen, MD;  Location: [facility] ENDOSCOPY;  Service: Gastroenterology;  Laterality: Left;   LEFT HEART CATH AND CORONARY ANGIOGRAPHY N/A 08/04/2018   Procedure: LEFT HEART CATH AND CORONARY ANGIOGRAPHY;  Surgeon: Lyn Records, MD;  Location: [facility] INVASIVE CV LAB;  Service: Cardiovascular;  Laterality: N/A;   None     POLYPECTOMY  07/16/2018   Procedure: POLYPECTOMY;  Surgeon: Kerin Salen, MD;  Location: [facility] ENDOSCOPY;  Service: Gastroenterology;;    Current Medications: No outpatient medications have been marked as taking for the 06/17/23 encounter (Appointment) with Jaydan Meidinger, Roberts Gaudy, MD.     Allergies:   Acetaminophen, Caffeine, Farxiga [dapagliflozin], Pork-derived products, and Lisinopril   Social and Family History: Reviewed in Epic  ROS:   Please see the history of present illness.    All other systems reviewed and are negative.  EKGs/Labs/Other Studies Reviewed Today:    Echocardiogram:  TTE 05/06/22 Ejection fraction 60 to 65%.  No regional wall motion abnormalities.  Severe LVH.  Normal RV systolic function.  Left atrial size is severely dilated.   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  Mild dynamic compression noted Ventricular fibrillation occurred during the study, treated with defibrillation  EKG:  Last EKG results: today - atrial fibrillation   Recent Labs: 03/24/2023: ALT 12 05/11/2023: B Natriuretic Peptide 385.7; BUN 34; Creatinine, Ser 1.59; Hemoglobin 11.7; Platelets 187; Potassium 4.5; Sodium 138     Physical Exam:    VS:  There were no vitals taken for this visit.    Wt Readings from Last 3 Encounters:  05/11/23 (!) 342 lb 6.4 oz (155.3 kg)  03/24/23 (!) 338 lb (153.3 kg)  02/26/23 (!) 338 lb 6.4 oz (153.5 kg)     GEN: Well nourished, well developed in no acute distress; morbidly obese CARDIAC: RRR, no murmurs, rubs, gallops RESPIRATORY:  Normal work of breathing MUSCULOSKELETAL: no edema    ASSESSMENT & PLAN:    Risk of sudden cardiac death Though she has a genetic abnormality involving a sequence that has been implicated in ARVC, she does not meet criteria for ARVC. She has a mutation in a gene for which some mutations have been linked to CPVT. Suspicion is low that  she may have CPVT; we would manage this with betablocker.  History of VF arrest Occurred during coronary catheterization, would consider this a reversible cause   History of sudden cardiac death in brother at age 77 I reviewed the medical records of the deceased in detail. I cannot find compelling evidence that he died from a heritable cause of sudden cardiac such as HCM or a channelopathy.  Atrial fibrillation Given obesity, atrial size, cardiomyopathy. I think the likelihood that she will have durable return to sinus rhythm is low.  Continue xarelto 20   She may follow-up with EP as needed.        Medication Adjustments/Labs and Tests Ordered: Current medicines are reviewed at length with the patient today.  Concerns regarding medicines are outlined above.  No orders of  the defined types were placed in this encounter.  No orders of the defined types were placed in this encounter.    Signed, Maurice Small, MD  06/16/2023 8:14 PM    [facility]

## 2023-06-17 ENCOUNTER — Encounter: Payer: Self-pay | Admitting: Cardiovascular Disease

## 2023-06-17 ENCOUNTER — Ambulatory Visit: Payer: Medicaid Other | Attending: Cardiovascular Disease | Admitting: Cardiovascular Disease

## 2023-06-17 VITALS — BP 114/68 | HR 127 | Ht 67.0 in | Wt 344.0 lb

## 2023-06-17 DIAGNOSIS — I4819 Other persistent atrial fibrillation: Secondary | ICD-10-CM

## 2023-06-17 NOTE — Patient Instructions (Signed)
Medication Instructions:  Your physician recommends that you continue on your current medications as directed. Please refer to the Current Medication list given to you today. *If you need a refill on your cardiac medications before your next appointment, please call your pharmacy*   Follow-Up: At [facility], you and your health needs are our priority.  As part of our continuing mission to provide you with exceptional heart care, we have created designated Provider Care Teams.  These Care Teams include your primary Cardiologist (physician) and Advanced Practice Providers (APPs -  Physician Assistants and Nurse Practitioners) who all work together to provide you with the care you need, when you need it.  We recommend signing up for the patient portal called "MyChart".  Sign up information is provided on this After Visit Summary.  MyChart is used to connect with patients for Virtual Visits (Telemedicine).  Patients are able to view lab/test results, encounter notes, upcoming appointments, etc.  Non-urgent messages can be sent to your provider as well.   To learn more about what you can do with MyChart, go to [email or web address].    Your next appointment:   Follow up with Dr Shirlee Latch  Provider:   Dr Shirlee Latch

## 2023-06-28 ENCOUNTER — Other Ambulatory Visit: Payer: Self-pay

## 2023-07-07 ENCOUNTER — Other Ambulatory Visit: Payer: Self-pay

## 2023-07-09 NOTE — Progress Notes (Addendum)
Advanced Heart Failure Clinic Note  PCP: Claiborne Rigg, NP HF Cardiologist: Dr. Shirlee Latch  HPI: Pt is a 57 y.o.female with a history of chronic diastolic heart failure, persistent atrial fibrillation on Xarelto, hypertension, obesity and OSA, not compliant w/ CPAP.  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  She had VFib with heart cath, treated w/ defibrillation to NSR. She also developed AKI after cath.  Echo 04/18/21 with EF 60-65%, no RWMA, severe LVH, RV normal with moderately elevated PA systolic pressure, Lt atrial size severely dilated and RA mildly dilated.  Mild MR, mod to severe TR.   Of note, brother had sudden cardiac death around age 62 and father and grandmother had CHF.    Admitted 11/10/21 for a/c diastolic CHF w/ fluid overload. Afib well rate controlled. Diuresed w/ IV Lasix, then transitioned to GDMT. Discharge home, weight 328 lbs.  Admitted 05/25/22 with acute on chronic HFpEF. Ran out her medications while traveling out of the country. Diuresed with IV lasix and transitioned to torsemide 40 mg bid. Echo showed LVEF 60-65%, severe LVH, septal thickenness over 20 mm but difficult to measure, RV normal.  Discharge weight 308 pounds.   cMRI 12/23 showed severe LAE with moderate RAE, severe basal septal hypertrophy 17 mm with LVEF normal 64%, no RWMAs, normal RV size and function RVEF 51%, abnormal delayed enhancement images with mottled LGE in apex and septum and more defined area of moderate LGE in mid inferior wall, normal parametric measures.  Unlikely cardiac amyloidosis, consider sarcoidosis or HCM.  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 She also had a RYR2 gene mutation.   7 day Zio 2/24 showed atrial fibrillation occurred continuously (100% burden),  ranging from 56-133 bpm (avg of 81 bpm). Isolated VEs were rare (<1.0%, 1345), VE Couplets were rare (<1.0%, 90), and VE Triplets were rare (<1.0%).    She was seen by EP, and after discussion, it was decided that she did not meet criteria for ICD.   Today she returns for HF follow up. Overall feeling fine. She has SOB walking further distances on flat ground, does OK around her home. Denies palpitations, abnormal bleeding, CP, dizziness, edema, or PND/Orthopnea. She chronically sleeps in recliner. Appetite ok. No fever or chills. Weight at home 336 pounds. Taking all medications.   ECG (personally reviewed): none ordered today.  Labs (7/23): Myeloma panel negative Labs (9/23): K 4.2, creatinine 1.36 Labs (10/23): K 4.2, creatinine 1.34, BNP 455 Labs (12/23): BNP 843, K 4, creatinine 1.2 Labs (3/24): Scr 1.10, K 4.3  Labs (4/24): SCr 1.28, K 4.2 Labs (6/24): K 4.5, creatinine 1.4 Labs (8/24): K 4.5, creatinine 1.59  PMH: 1. Atrial fibrillation: Chronic since 2019.  2. HTN 3. OSA: Unable to tolerate CPAP.  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   - Cardiolite (8/17): No ischemia 5. Chronic diastolic CHF: Echo (8/23): EF 29-56%, severe LVH, Septal thickenness over 20mm but difficult to measure, RV normal.   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 EF >65%.  - Chest CT 2019 Prominent mediastinal lymph nodes, likely reactive. Lungs/Pleura: +Calcified granulomas. - Cardiac MRI (12/23): Severe LAE with moderate RAE, severe basal septal hypertrophy  17 mm with LVEF normal 64%, no RWMAs, normal RV size and function RVEF 51%, abnormal delayed enhancement images with mottled LGE in apex and septum and more defined area of moderate LGE in mid inferior wall, normal parametric measures.   - [employer] gene testing showed pathogenic variant of PKP2, associated w/ arrhythmogenic cardiomyopathy. She also had a  RYR2 gene mutation.  6. Bipolar disorder 7. 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 But ran out of money and is no longer attending school. (Note patient has bipolar disorder).      Born in [place] but lived in [place] before coming back to [place].       Primary language is Arabic. Lives with mother and brother.   Social Determinants of Health   Financial Resource Strain: Low Risk  (05/26/2022)   Overall Financial Resource Strain (CARDIA)    Difficulty of Paying Living Expenses: Not very hard  Food Insecurity: No Food Insecurity (10/23/2022)   Hunger Vital Sign    Worried About Running Out of Food in the Last Year: Never true    Ran Out of Food in the Last Year: Never true  Transportation Needs: Unmet Transportation Needs (10/23/2022)   PRAPARE - Transportation    Lack of Transportation (Medical): Yes    Lack of Transportation (Non-Medical): Yes  Physical Activity: Not on file  Stress: No Stress Concern Present (07/15/2018)   [employer] of Occupational Health - Occupational Stress Questionnaire    Feeling of Stress : Only a little  Social Connections: Not on file  Intimate Partner Violence: Not At Risk (10/23/2022)   Humiliation, Afraid, Rape, and Kick questionnaire    Fear of Current or Ex-Partner: No    Emotionally Abused: No    Physically Abused: No     Sexually Abused: No   FH:  Brother with sudden cardiac death, father with CVA  Current Outpatient Medications  Medication Sig Dispense Refill   Accu-Chek Softclix Lancets lancets Use 2 (two) times daily. 100 each 1   amLODipine (NORVASC) 5 MG tablet Take 1 tablet (5 mg total) by mouth daily. 90 tablet 3   Blood Glucose Monitoring Suppl (ACCU-CHEK GUIDE) w/Device KIT use to check blood sugar 1 kit 0   carvedilol (COREG) 25 MG tablet Take 1 tablet (25 mg total) by mouth 2 (two) times daily with a meal. 180 tablet 1   glipiZIDE (GLUCOTROL XL) 10 MG 24 hr tablet Take 1 tablet (10 mg total) by mouth daily with breakfast. 90 tablet 1   glucose blood (TRUE METRIX BLOOD GLUCOSE TEST) test strip Use as instructed 100 each 12   hydrALAZINE (APRESOLINE) 25 MG tablet Take 1 tablet (25 mg total) by mouth 2 (two) times daily at 10 AM  and 5 PM. 180 tablet 1   hydrOXYzine (VISTARIL) 25 MG capsule Take 1 capsule (25 mg total) by mouth every 8 (eight) hours as needed. (Patient taking differently: Take 25 mg by mouth daily.) 60 capsule 1   Incontinence Supply Disposable (COMFORT SHIELD ADULT DIAPERS) MISC Please provide with sixe 2-3xl adult diapers. N39.3 48 each 12   Incontinence Supply Disposable (INCONTINENCE BRIEF LARGE) MISC Please provide patient with insurance approved incontinence supplies/briefs 18 each 6   metFORMIN (GLUCOPHAGE) 1000 MG tablet Take 1 tablet (1,000 mg total) by mouth 2 (two) times daily with a meal. 180 tablet 1   risperiDONE (RISPERDAL) 2 MG tablet Take 1 tablet (2 mg total) by mouth at bedtime. 30 tablet 3   rivaroxaban (XARELTO) 20 MG TABS tablet Take 1 tablet (20 mg total) by mouth daily with supper. 30 tablet 5   sacubitril-valsartan (ENTRESTO) 97-103 MG Take 1 tablet by mouth 2 (two) times daily. 60 tablet 10   Semaglutide,0.25 or 0.5MG /DOS, (OZEMPIC, 0.25 OR 0.5 MG/DOSE,) 2 MG/3ML SOPN Inject 0.5 mg into the skin once a week. 9 mL 1   spironolactone (ALDACTONE) 25 MG tablet  Take 1 tablet (25 mg total) by mouth daily. 30 tablet 3   torsemide (DEMADEX) 20 MG tablet Take 4 tablets (80 mg total) by mouth daily. (Patient taking differently: Patient takes 2 tablets by mouth in the morning and evening.) 120 tablet 5   No current facility-administered medications for this encounter.   BP 130/84   Pulse 92   Wt (!) 154.6 kg (340 lb 12.8 oz)   SpO2 97%   BMI 53.38 kg/m   Wt Readings from Last 3 Encounters:  07/12/23 (!) 154.6 kg (340 lb 12.8 oz)  06/17/23 (!) 156 kg (344 lb)  05/11/23 (!) 155.3 kg (342 lb 6.4 oz)   PHYSICAL EXAM: General:  NAD. No resp difficulty, arrived in [facility] HEENT: Normal Neck: Supple. No JVD. Carotids 2+ bilat; no bruits. No lymphadenopathy or thryomegaly appreciated. Cor: PMI nondisplaced. Irregular rate & rhythm. No rubs, gallops or murmurs. Lungs: Clear Abdomen: Soft, obese, nontender, nondistended. No hepatosplenomegaly. No bruits or masses. Good bowel sounds. Extremities: No cyanosis, clubbing, rash, edema Neuro: Alert & oriented x 3, cranial nerves grossly intact. Moves all 4 extremities w/o difficulty. Affect pleasant.  ASSESSMENT & PLAN: 1. Chronic Diastolic CHF:  Echo (8/23) with EF 60-65%, severe LVH, Septal thickenness over 20mm but difficult to measure, RV normal.   Severe LVH may be secondary to HTN, but also was concerned for possible hypertrophic cardiomyopathy.  Father/grandmother with CHF and brother with ?sudden death is concerning for familial cardiomyopathy.  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 cMRI w/ normal sized RV and function, RVEF 51%, severe basal septal hypertrophy 17 mm, LVEF normal 64% no RWMAs, abnormal LGE in con-coronary pattern with mottled uptake in apex and septum and more defined area of moderate uptake in mid inferior wall.  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  Cardiac MRI was not suggestive of cardiac amyloidosis. Zio 2/24 showed 100% afib, no ventricular arrythmias.  No syncope or palpiations.  NYHA class IIb-III chronically, not volume overloaded by exam.  Suspect obesity/deconditioning plays a role in her dyspnea.  - She saw Dr. Nelly Laurence for ICD consideration given suspicion for LV-predominant  arrhythmogenic cardiomyopathy with extensive LGE on cMRI; However, after extensive discussion/consideration she was not thought to be a candidate for ICD.  - Continue torsemide 80 mg daily. BMET/BNP today.  - Continue Entresto 97-103 mg bid      - Continue spironolactone 25 mg daily  - Continue carvedilol 25 mg bid. - She did not tolerate Jardiance - Referred to Dr. Sidney Ace for genetic counseling, still needs to schedule.  Will re-refer today.  - Previously discussed Cardiomems placement but she refused.   - Compliance has been an issue. Was referred to paramedicine but did not cooperate and discharged from the program  - Update echo next visit. 2. Hypertension: controlled on current regimen  - GDMT per above.  3. Atrial fibrillation: Chronic since at least 2019. Zio 2.24 showed 100% burden.  Unlikely to cardiovert and stay in NSR at this point given chronicity and noncompliance with CPAP.  Rate controlled.  - Continue Coreg 25 mg bid  - Continue Xarelto. No bleeding issues. 4. OSA: Not compliant w/ CPAP.  5. Morbid Obesity: Body mass index is 53.38 kg/m.  Now on semaglutide but weight has plateaued. - ? If tirzepatide would be more effective. 6. DM II: Per PCP.  7. Anomalous RCA: Off the left cusp, appears to course interarterially between aorta and PA.  Cardiolite in 2017 showed no ischemia.  With an anomalous right, no attributable symptoms (no chest pain), and normal Cardiolite in past, would medically manage this.  - Continue statin +  ? blocker - no ASA w/ Xarelto use   Follow up in 4 months with Dr. Shirlee Latch + echo  Anderson [place], FNP-BC 07/12/2023

## 2023-07-12 ENCOUNTER — Ambulatory Visit (HOSPITAL_COMMUNITY)
Admission: RE | Admit: 2023-07-12 | Discharge: 2023-07-12 | Disposition: A | Discharge status: Home / Self Care | Payer: Medicaid Other | Source: Ambulatory Visit | Attending: Family Medicine

## 2023-07-12 ENCOUNTER — Encounter (HOSPITAL_COMMUNITY): Payer: Self-pay

## 2023-07-12 VITALS — BP 130/84 | HR 92 | Wt 340.8 lb

## 2023-07-12 DIAGNOSIS — Z7901 Long term (current) use of anticoagulants: Secondary | ICD-10-CM | POA: Diagnosis not present

## 2023-07-12 DIAGNOSIS — I5033 Acute on chronic diastolic (congestive) heart failure: Secondary | ICD-10-CM | POA: Diagnosis not present

## 2023-07-12 DIAGNOSIS — Z7985 Long-term (current) use of injectable non-insulin antidiabetic drugs: Secondary | ICD-10-CM | POA: Diagnosis not present

## 2023-07-12 DIAGNOSIS — I4819 Other persistent atrial fibrillation: Secondary | ICD-10-CM | POA: Diagnosis not present

## 2023-07-12 DIAGNOSIS — Z8249 Family history of ischemic heart disease and other diseases of the circulatory system: Secondary | ICD-10-CM | POA: Diagnosis not present

## 2023-07-12 DIAGNOSIS — I429 Cardiomyopathy, unspecified: Secondary | ICD-10-CM | POA: Diagnosis not present

## 2023-07-12 DIAGNOSIS — I10 Essential (primary) hypertension: Secondary | ICD-10-CM

## 2023-07-12 DIAGNOSIS — E1169 Type 2 diabetes mellitus with other specified complication: Secondary | ICD-10-CM

## 2023-07-12 DIAGNOSIS — Z6841 Body Mass Index (BMI) 40.0 and over, adult: Secondary | ICD-10-CM | POA: Diagnosis not present

## 2023-07-12 DIAGNOSIS — Z91199 Patient's noncompliance with other medical treatment and regimen due to unspecified reason: Secondary | ICD-10-CM | POA: Insufficient documentation

## 2023-07-12 DIAGNOSIS — Q245 Malformation of coronary vessels: Secondary | ICD-10-CM | POA: Diagnosis not present

## 2023-07-12 DIAGNOSIS — I4901 Ventricular fibrillation: Secondary | ICD-10-CM | POA: Diagnosis not present

## 2023-07-12 DIAGNOSIS — G4733 Obstructive sleep apnea (adult) (pediatric): Secondary | ICD-10-CM | POA: Insufficient documentation

## 2023-07-12 DIAGNOSIS — Z79899 Other long term (current) drug therapy: Secondary | ICD-10-CM | POA: Insufficient documentation

## 2023-07-12 DIAGNOSIS — I5032 Chronic diastolic (congestive) heart failure: Secondary | ICD-10-CM | POA: Diagnosis not present

## 2023-07-12 DIAGNOSIS — E119 Type 2 diabetes mellitus without complications: Secondary | ICD-10-CM | POA: Insufficient documentation

## 2023-07-12 DIAGNOSIS — I110 Hypertensive heart disease with heart failure: Secondary | ICD-10-CM | POA: Diagnosis present

## 2023-07-12 LAB — BASIC METABOLIC PANEL
Anion gap: 11 (ref 5–15)
BUN: 18 mg/dL (ref 6–20)
CO2: 25 mmol/L (ref 22–32)
Calcium: 9.2 mg/dL (ref 8.9–10.3)
Chloride: 104 mmol/L (ref 98–111)
Creatinine, Ser: 1.38 mg/dL — ABNORMAL HIGH (ref 0.44–1.00)
GFR, Estimated: 45 mL/min — ABNORMAL LOW (ref >60)
Glucose, Bld: 112 mg/dL — ABNORMAL HIGH (ref 70–99)
Potassium: 4.1 mmol/L (ref 3.5–5.1)
Sodium: 140 mmol/L (ref 135–145)

## 2023-07-12 LAB — BRAIN NATRIURETIC PEPTIDE: B Natriuretic Peptide: 300.7 pg/mL — ABNORMAL HIGH (ref 0.0–100.0)

## 2023-07-12 NOTE — Patient Instructions (Addendum)
No change in medications. Referral sent to Dr. Manfred Arch - see below. They were unable to get in touch with you before, please set up your voicemail on phone. Return to see Dr. Shirlee Latch in 4 months with echo. We will call and schedule this with you. Labs today - we will call you if abnormal.  Please call us at [phone] if any questions or concerns prior to your next visit.

## 2023-07-12 NOTE — Addendum Note (Signed)
Encounter addended by: Jacklynn Ganong, FNP on: 07/12/2023 3:12 PM  Actions taken: Clinical Note Signed

## 2023-07-25 MED FILL — Rivaroxaban Tab 20 MG: ORAL | 30 days supply | Qty: 30 | Fill #4 | Status: AC

## 2023-07-26 ENCOUNTER — Other Ambulatory Visit: Payer: Self-pay

## 2023-08-22 ENCOUNTER — Other Ambulatory Visit: Payer: Self-pay | Admitting: Nurse Practitioner

## 2023-08-22 DIAGNOSIS — F419 Anxiety disorder, unspecified: Secondary | ICD-10-CM

## 2023-08-22 DIAGNOSIS — I10 Essential (primary) hypertension: Secondary | ICD-10-CM

## 2023-08-22 MED FILL — Rivaroxaban Tab 20 MG: ORAL | 30 days supply | Qty: 30 | Fill #5 | Status: AC

## 2023-08-23 MED ORDER — SPIRONOLACTONE 25 MG PO TABS
25.0000 mg | ORAL_TABLET | Freq: Every day | ORAL | 0 refills | Status: DC
Start: 2023-08-23 — End: 2023-11-10
  Filled 2023-08-23: qty 30, 30d supply, fill #0

## 2023-08-23 MED ORDER — RISPERIDONE 2 MG PO TABS
2.0000 mg | ORAL_TABLET | Freq: Every day | ORAL | 3 refills | Status: DC
Start: 2023-08-23 — End: 2024-01-02
  Filled 2023-08-23: qty 30, 30d supply, fill #0
  Filled 2023-09-29: qty 30, 30d supply, fill #1
  Filled 2023-10-21 – 2023-11-10 (×3): qty 30, 30d supply, fill #2
  Filled 2023-12-08: qty 30, 30d supply, fill #3

## 2023-08-24 ENCOUNTER — Other Ambulatory Visit: Payer: Self-pay

## 2023-08-24 ENCOUNTER — Other Ambulatory Visit (HOSPITAL_COMMUNITY): Payer: Self-pay

## 2023-09-09 ENCOUNTER — Other Ambulatory Visit: Payer: Self-pay

## 2023-09-09 ENCOUNTER — Other Ambulatory Visit (HOSPITAL_COMMUNITY): Payer: Self-pay | Admitting: Cardiology

## 2023-09-09 MED ORDER — TORSEMIDE 20 MG PO TABS
80.0000 mg | ORAL_TABLET | Freq: Every day | ORAL | 5 refills | Status: DC
  Filled 2023-09-09: qty 120, 30d supply, fill #0
  Filled 2023-10-22: qty 120, 30d supply, fill #1
  Filled 2023-12-08: qty 120, 30d supply, fill #2
  Filled 2024-01-02: qty 120, 30d supply, fill #3
  Filled 2024-02-08: qty 120, 30d supply, fill #4
  Filled 2024-06-05: qty 120, 30d supply, fill #5

## 2023-09-10 ENCOUNTER — Other Ambulatory Visit: Payer: Self-pay

## 2023-09-14 ENCOUNTER — Encounter: Payer: Medicaid Other | Admitting: Genetic Counselor

## 2023-09-24 ENCOUNTER — Encounter: Payer: Self-pay | Admitting: Genetic Counselor

## 2023-09-29 ENCOUNTER — Other Ambulatory Visit (HOSPITAL_COMMUNITY): Payer: Self-pay | Admitting: Cardiology

## 2023-09-29 ENCOUNTER — Other Ambulatory Visit: Payer: Self-pay | Admitting: Cardiovascular Disease

## 2023-09-29 DIAGNOSIS — I4819 Other persistent atrial fibrillation: Secondary | ICD-10-CM

## 2023-09-30 ENCOUNTER — Other Ambulatory Visit: Payer: Self-pay

## 2023-09-30 MED ORDER — RIVAROXABAN 20 MG PO TABS
20.0000 mg | ORAL_TABLET | Freq: Every day | ORAL | 5 refills | Status: DC
Start: 2023-09-30 — End: 2024-04-18
  Filled 2023-09-30: qty 30, 30d supply, fill #0
  Filled 2023-10-21 – 2023-11-10 (×2): qty 30, 30d supply, fill #1
  Filled 2023-12-08: qty 30, 30d supply, fill #2
  Filled 2024-01-02: qty 30, 30d supply, fill #3
  Filled 2024-02-08: qty 30, 30d supply, fill #4
  Filled 2024-03-14 (×2): qty 30, 30d supply, fill #5

## 2023-09-30 MED ORDER — ENTRESTO 97-103 MG PO TABS
1.0000 | ORAL_TABLET | Freq: Two times a day (BID) | ORAL | 10 refills | Status: DC
  Filled 2023-09-30: qty 60, 30d supply, fill #0
  Filled 2023-11-10: qty 60, 30d supply, fill #1
  Filled 2023-12-08: qty 60, 30d supply, fill #2

## 2023-09-30 NOTE — Telephone Encounter (Signed)
 Prescription refill request for Xarelto received.  Indication: Afib  Last office visit: 07/12/23 (CHF)  Weight: 154.6kg Age: 58 Scr: 1.38 (07/12/23)  CrCl: 112.12ml/min  Appropriate dose. Refill sent.

## 2023-10-05 ENCOUNTER — Other Ambulatory Visit: Payer: Self-pay

## 2023-10-06 ENCOUNTER — Other Ambulatory Visit: Payer: Self-pay

## 2023-10-12 ENCOUNTER — Encounter: Payer: Medicaid Other | Admitting: Genetic Counselor

## 2023-10-21 ENCOUNTER — Other Ambulatory Visit (HOSPITAL_COMMUNITY): Payer: Self-pay | Admitting: Cardiology

## 2023-10-21 ENCOUNTER — Other Ambulatory Visit: Payer: Self-pay | Admitting: Nurse Practitioner

## 2023-10-21 DIAGNOSIS — E1165 Type 2 diabetes mellitus with hyperglycemia: Secondary | ICD-10-CM

## 2023-10-21 DIAGNOSIS — I10 Essential (primary) hypertension: Secondary | ICD-10-CM

## 2023-10-22 ENCOUNTER — Other Ambulatory Visit: Payer: Self-pay | Admitting: Family Medicine

## 2023-10-22 ENCOUNTER — Other Ambulatory Visit: Payer: Self-pay

## 2023-10-22 ENCOUNTER — Other Ambulatory Visit: Payer: Self-pay | Admitting: Nurse Practitioner

## 2023-10-22 DIAGNOSIS — I10 Essential (primary) hypertension: Secondary | ICD-10-CM

## 2023-10-22 DIAGNOSIS — E1165 Type 2 diabetes mellitus with hyperglycemia: Secondary | ICD-10-CM

## 2023-10-22 MED ORDER — OZEMPIC (0.25 OR 0.5 MG/DOSE) 2 MG/3ML ~~LOC~~ SOPN
0.5000 mg | PEN_INJECTOR | SUBCUTANEOUS | 0 refills | Status: DC
Start: 2023-10-22 — End: 2023-10-29
  Filled 2023-10-22: qty 3, 28d supply, fill #0

## 2023-10-22 MED ORDER — METFORMIN HCL 1000 MG PO TABS
1000.0000 mg | ORAL_TABLET | Freq: Two times a day (BID) | ORAL | 0 refills | Status: DC
Start: 2023-10-22 — End: 2023-12-08
  Filled 2023-10-22: qty 60, 30d supply, fill #0

## 2023-10-22 MED ORDER — AMLODIPINE BESYLATE 5 MG PO TABS
5.0000 mg | ORAL_TABLET | Freq: Every day | ORAL | 3 refills | Status: DC
  Filled 2023-10-22: qty 90, 90d supply, fill #0
  Filled 2024-01-02: qty 90, 90d supply, fill #1

## 2023-10-22 MED ORDER — HYDRALAZINE HCL 25 MG PO TABS
25.0000 mg | ORAL_TABLET | Freq: Two times a day (BID) | ORAL | 0 refills | Status: DC
Start: 2023-10-22 — End: 2023-12-08
  Filled 2023-10-22: qty 60, 30d supply, fill #0

## 2023-10-22 NOTE — Telephone Encounter (Signed)
Requested medication (s) are due for refill today: Yes  Requested medication (s) are on the active medication list: Yes  Last refill:  03/24/23  Future visit scheduled: No  Notes to clinic:  Left pt. Message to make appointment.    Requested Prescriptions  Pending Prescriptions Disp Refills   metFORMIN (GLUCOPHAGE) 1000 MG tablet 180 tablet 1    Sig: Take 1 tablet (1,000 mg total) by mouth 2 (two) times daily with a meal.     Endocrinology:  Diabetes - Biguanides Failed - 10/22/2023 10:56 AM      Failed - Cr in normal range and within 360 days    Creat  Date Value Ref Range Status  08/06/2016 0.81 0.50 - 1.10 mg/dL Final   Creatinine, Ser  Date Value Ref Range Status  07/12/2023 1.38 (H) 0.44 - 1.00 mg/dL Final   Creatinine, Urine  Date Value Ref Range Status  08/06/2018 197.25 mg/dL Final    Comment:    Performed at [facility], [address] [place], [place] [address]         Failed - HBA1C is between 0 and 7.9 and within 180 days    HbA1c, POC (controlled diabetic range)  Date Value Ref Range Status  09/16/2018 7.1 (A) 0.0 - 7.0 % Final   Hgb A1c MFr Bld  Date Value Ref Range Status  03/24/2023 6.1 (H) 4.8 - 5.6 % Final    Comment:             Prediabetes: 5.7 - 6.4          Diabetes: >6.4          Glycemic control for adults with diabetes: <7.0          Failed - eGFR in normal range and within 360 days    GFR, Est African American  Date Value Ref Range Status  08/06/2016 >89 >=60 mL/min Final   GFR calc Af Amer  Date Value Ref Range Status  09/17/2020 77 >59 mL/min/1.73 Final    Comment:    **In accordance with recommendations from the NKF-ASN Task force,**   [facility] is in the process of updating its eGFR calculation to the   2021 CKD-EPI creatinine equation that estimates kidney function   without a race variable.    GFR, Est Non African American  Date Value Ref Range Status  08/06/2016 86 >=60 mL/min Final   GFR, Estimated   Date Value Ref Range Status  07/12/2023 45 (L) >60 mL/min Final    Comment:    (NOTE) Calculated using the CKD-EPI Creatinine Equation (2021)    GFR  Date Value Ref Range Status  12/25/2013 117.88 >60.00 mL/min Final   eGFR  Date Value Ref Range Status  03/24/2023 44 (L) >59 mL/min/1.73 Final         Failed - B12 Level in normal range and within 720 days    Vitamin B-12  Date Value Ref Range Status  12/15/2019 235 180 - 914 pg/mL Final    Comment:    (NOTE) This assay is not validated for testing neonatal or myeloproliferative syndrome specimens for Vitamin B12 levels. Performed at [facility], [address] [place], [place] [address]          Failed - Valid encounter within last 6 months    Recent Outpatient Visits           7 months ago Primary hypertension   [facility]  [facility] Dept Of [place]. [facility] Claiborne Rigg, NP   10 months ago Primary hypertension   [facility] Dept Of [place]. [facility] Claiborne Rigg, NP   11 months ago Uncontrolled type 2 diabetes mellitus with hyperglycemia [facility])   [facility] - [facility] Dept Of [place]. [facility], [place], [place]   1 year ago Uncontrolled type 2 diabetes mellitus with hyperglycemia [facility])   [facility] - A Dept Of [place]. [facility] [place], [place], [place]   1 year ago Hospital discharge follow-up   [facility] - A Dept Of [place]. [facility] Claiborne Rigg, NP       Future Appointments             In 1 month Crenshaw, Madolyn Frieze, MD [facility] - CBC within normal limits and completed in the last 12 months    WBC  Date Value Ref Range Status  05/11/2023 4.8 4.0 - 10.5 K/uL Final   RBC  Date Value Ref Range Status  05/11/2023 4.27 3.87 - 5.11 MIL/uL Final   Hemoglobin  Date  Value Ref Range Status  05/11/2023 11.7 (L) 12.0 - 15.0 g/dL Final  16/06/9603 54.0 11.1 - 15.9 g/dL Final   HCT  Date Value Ref Range Status  05/11/2023 37.5 36.0 - 46.0 % Final   Hematocrit  Date Value Ref Range Status  11/25/2022 42.7 34.0 - 46.6 % Final   MCHC  Date Value Ref Range Status  05/11/2023 31.2 30.0 - 36.0 g/dL Final   [facility]  Date Value Ref Range Status  05/11/2023 27.4 26.0 - 34.0 pg Final   MCV  Date Value Ref Range Status  05/11/2023 87.8 80.0 - 100.0 fL Final  11/25/2022 91 79 - 97 fL Final   No results found for: "PLTCOUNTKUC", "LABPLAT", "POCPLA" RDW  Date Value Ref Range Status  05/11/2023 13.5 11.5 - 15.5 % Final  11/25/2022 12.8 11.7 - 15.4 % Final          hydrALAZINE (APRESOLINE) 25 MG tablet 180 tablet 1    Sig: Take 1 tablet (25 mg total) by mouth 2 (two) times daily at 10 AM and 5 PM.     Cardiovascular:  Vasodilators Failed - 10/22/2023 10:56 AM      Failed - HGB in normal range and within 360 days    Hemoglobin  Date Value Ref Range Status  05/11/2023 11.7 (L) 12.0 - 15.0 g/dL Final  98/07/9146 82.9 11.1 - 15.9 g/dL Final         Failed - ANA Screen, Ifa, Serum in normal range and within 360 days    ANA Ab, IFA  Date Value Ref Range Status  11/12/2015 Negative  Final    Comment:    (NOTE)                                     Negative   <1:80                                     Borderline  1:80  Positive   >1:80 Performed At: [facility] [address] [place], [place] [id number] Mila [place] MD VH:[id number]          Passed - HCT in normal range and within 360 days    HCT  Date Value Ref Range Status  05/11/2023 37.5 36.0 - 46.0 % Final   Hematocrit  Date Value Ref Range Status  11/25/2022 42.7 34.0 - 46.6 % Final         Passed - RBC in normal range and within 360 days    RBC  Date Value Ref Range Status  05/11/2023 4.27 3.87 - 5.11 MIL/uL Final         Passed - WBC  in normal range and within 360 days    WBC  Date Value Ref Range Status  05/11/2023 4.8 4.0 - 10.5 K/uL Final         Passed - PLT in normal range and within 360 days    Platelets  Date Value Ref Range Status  05/11/2023 187 150 - 400 K/uL Final  11/25/2022 207 150 - 450 x10E3/uL Final         Passed - Last BP in normal range    BP Readings from Last 1 Encounters:  07/12/23 130/84         Passed - Valid encounter within last 12 months    Recent Outpatient Visits           7 months ago Primary hypertension   [facility] Of [place]. [facility] Claiborne Rigg, NP   10 months ago Primary hypertension   [facility] Dept Of [place]. [facility] Claiborne Rigg, NP   11 months ago Uncontrolled type 2 diabetes mellitus with hyperglycemia [facility])   [facility] - [facility] Dept Of [place]. [facility] [place], [place], [place]   1 year ago Uncontrolled type 2 diabetes mellitus with hyperglycemia [facility])   [facility] [facility] - A Dept Of [place]. [facility] [place], [place], [place]   1 year ago Hospital discharge follow-up   [facility] - A Dept Of [place]. [facility] Claiborne Rigg, NP       Future Appointments             In 1 month Crenshaw, Madolyn Frieze, MD [facility] at [facility]             Semaglutide,0.25 or 0.5MG /DOS, (OZEMPIC, 0.25 OR 0.5 MG/DOSE,) 2 MG/3ML SOPN 9 mL 1    Sig: Inject 0.5 mg into the skin once a week.     Endocrinology:  Diabetes - GLP-1 Receptor Agonists - semaglutide Failed - 10/22/2023 10:56 AM      Failed - HBA1C in normal range and within 180 days    HbA1c, POC (controlled diabetic range)  Date Value Ref Range Status  09/16/2018 7.1 (A) 0.0 - 7.0 % Final   Hgb A1c MFr Bld  Date Value Ref Range Status  03/24/2023 6.1 (H) 4.8 - 5.6 % Final    Comment:             Prediabetes:  5.7 - 6.4          Diabetes: >6.4          Glycemic control for adults with diabetes: <7.0  Failed - Cr in normal range and within 360 days    Creat  Date Value Ref Range Status  08/06/2016 0.81 0.50 - 1.10 mg/dL Final   Creatinine, Ser  Date Value Ref Range Status  07/12/2023 1.38 (H) 0.44 - 1.00 mg/dL Final   Creatinine, Urine  Date Value Ref Range Status  08/06/2018 197.25 mg/dL Final    Comment:    Performed at [facility], [address], [place], [place] [address]         Failed - Valid encounter within last 6 months    Recent Outpatient Visits           7 months ago Primary hypertension   [facility] Of [place]. [facility] Claiborne Rigg, NP   10 months ago Primary hypertension   [facility] Dept Of [place]. [facility] Claiborne Rigg, NP   11 months ago Uncontrolled type 2 diabetes mellitus with hyperglycemia [facility])   [facility] - [facility] Dept Of [place]. [facility] [place], [place], [place]   1 year ago Uncontrolled type 2 diabetes mellitus with hyperglycemia [facility])   [facility] - [facility] Dept Of [place]. [facility] [place], [place], [place]   1 year ago Hospital discharge follow-up   [facility] - A Dept Of . [facility] Claiborne Rigg, NP       Future Appointments             In 1 month Crenshaw, Madolyn Frieze, MD [facility] at [facility]

## 2023-10-22 NOTE — Telephone Encounter (Signed)
Requested Prescriptions  Pending Prescriptions Disp Refills   hydrALAZINE (APRESOLINE) 25 MG tablet 180 tablet 1    Sig: Take 1 tablet (25 mg total) by mouth 2 (two) times daily at 10 AM and 5 PM.     Cardiovascular:  Vasodilators Failed - 10/22/2023  1:22 PM      Failed - HGB in normal range and within 360 days    Hemoglobin  Date Value Ref Range Status  05/11/2023 11.7 (L) 12.0 - 15.0 g/dL Final  16/06/9603 54.0 11.1 - 15.9 g/dL Final         Failed - ANA Screen, Ifa, Serum in normal range and within 360 days    ANA Ab, IFA  Date Value Ref Range Status  11/12/2015 Negative  Final    Comment:    (NOTE)                                     Negative   <1:80                                     Borderline  1:80                                     Positive   >1:80 Performed At: [facility] [address] [place], [place] [id number] Mila Homer MD [id number]          Passed - HCT in normal range and within 360 days    HCT  Date Value Ref Range Status  05/11/2023 37.5 36.0 - 46.0 % Final   Hematocrit  Date Value Ref Range Status  11/25/2022 42.7 34.0 - 46.6 % Final         Passed - RBC in normal range and within 360 days    RBC  Date Value Ref Range Status  05/11/2023 4.27 3.87 - 5.11 MIL/uL Final         Passed - WBC in normal range and within 360 days    WBC  Date Value Ref Range Status  05/11/2023 4.8 4.0 - 10.5 K/uL Final         Passed - PLT in normal range and within 360 days    Platelets  Date Value Ref Range Status  05/11/2023 187 150 - 400 K/uL Final  11/25/2022 207 150 - 450 x10E3/uL Final         Passed - Last BP in normal range    BP Readings from Last 1 Encounters:  07/12/23 130/84         Passed - Valid encounter within last 12 months    Recent Outpatient Visits           7 months ago Primary hypertension   [place] [facility] [place] [place]. [facility] Claiborne Rigg, NP   10 months ago Primary  hypertension   [place] [facility] [place] - [facility] Dept Of [place]. [facility] Claiborne Rigg, NP   11 months ago Uncontrolled type 2 diabetes mellitus with hyperglycemia [facility])   [place] [facility] - A Dept Of [place]. [facility] [place], [place], [place]   1 year ago Uncontrolled type 2 diabetes mellitus  with hyperglycemia [facility])   [place] [facility] - A Dept Of [place]. [facility] [place], [place]ella [place]lein, [place]   1 year ago Hospital discharge follow-up   [place] [facility] - A Dept Of [place]. [facility] Claiborne Rigg, NP       Future Appointments             In 1 week Claiborne Rigg, NP [facility] - A Dept Of [place] Bridegroom. [facility]   In 1 month Crenshaw, Madolyn Frieze, MD [facility]             metFORMIN (GLUCOPHAGE) 1000 MG tablet 180 tablet 1    Sig: Take 1 tablet (1,000 mg total) by mouth 2 (two) times daily with a meal.     Endocrinology:  Diabetes - Biguanides Failed - 10/22/2023  1:22 PM      Failed - Cr in normal range and within 360 days    Creat  Date Value Ref Range Status  08/06/2016 0.81 0.50 - 1.10 mg/dL Final   Creatinine, Ser  Date Value Ref Range Status  07/12/2023 1.38 (H) 0.44 - 1.00 mg/dL Final   Creatinine, Urine  Date Value Ref Range Status  08/06/2018 197.25 mg/dL Final    Comment:    Performed at [facility], [address], [place], [place] [address]         Failed - HBA1C is between 0 and 7.9 and within 180 days    HbA1c, POC (controlled diabetic range)  Date Value Ref Range Status  09/16/2018 7.1 (A) 0.0 - 7.0 % Final   Hgb A1c MFr Bld  Date Value Ref Range Status  03/24/2023 6.1 (H) 4.8 - 5.6 % Final    Comment:             Prediabetes: 5.7 - 6.4          Diabetes: >6.4          Glycemic control for adults with diabetes: <7.0          Failed - eGFR in normal range and within  360 days    GFR, Est African American  Date Value Ref Range Status  08/06/2016 >89 >=60 mL/min Final   GFR calc Af Amer  Date Value Ref Range Status  09/17/2020 77 >59 mL/min/1.73 Final    Comment:    **In accordance with recommendations from the NKF-ASN Task force,**   [facility] is in the process of updating its eGFR calculation to the   2021 CKD-EPI creatinine equation that estimates kidney function   without a race variable.    GFR, Est Non African American  Date Value Ref Range Status  08/06/2016 86 >=60 mL/min Final   GFR, Estimated  Date Value Ref Range Status  07/12/2023 45 (L) >60 mL/min Final    Comment:    (NOTE) Calculated using the CKD-EPI Creatinine Equation (2021)    GFR  Date Value Ref Range Status  12/25/2013 117.88 >60.00 mL/min Final   eGFR  Date Value Ref Range Status  03/24/2023 44 (L) >59 mL/min/1.73 Final         Failed - B12 Level in normal range and within 720 days    Vitamin B-12  Date Value Ref Range Status  12/15/2019 235 180 - 914 pg/mL Final    Comment:    (NOTE) This assay is not validated for testing neonatal  or myeloproliferative syndrome specimens for Vitamin B12 levels. Performed at [facility], [address], [place], [place] [address]          Failed - Valid encounter within last 6 months    Recent Outpatient Visits           7 months ago Primary hypertension   [place] [facility] - A Dept Of [place]. [facility] Claiborne Rigg, NP   10 months ago Primary hypertension   [place] [facility] [place] - A Dept Of [place]. [facility] Claiborne Rigg, NP   11 months ago Uncontrolled type 2 diabetes mellitus with hyperglycemia [facility])   [place] [facility] - A Dept Of [place]. [facility] [place], [place], [place]   1 year ago Uncontrolled type 2 diabetes mellitus with hyperglycemia [facility])   [place] [facility] - A Dept Of [place]. [facility] [place], [place], [place]   1 year ago Hospital discharge follow-up   [place] [facility] - A Dept Of [place]. [facility] Claiborne Rigg, NP       Future Appointments             In 1 week Claiborne Rigg, NP [facility] - A Dept Of [place]igh[place] Bridegroom. [facility]   In 1 month Crenshaw, Madolyn Frieze, MD [facility] - CBC within normal limits and completed in the last 12 months    WBC  Date Value Ref Range Status  05/11/2023 4.8 4.0 - 10.5 K/uL Final   RBC  Date Value Ref Range Status  05/11/2023 4.27 3.87 - 5.11 MIL/uL Final   Hemoglobin  Date Value Ref Range Status  05/11/2023 11.7 (L) 12.0 - 15.0 g/dL Final  60/45/4098 11.9 11.1 - 15.9 g/dL Final   HCT  Date Value Ref Range Status  05/11/2023 37.5 36.0 - 46.0 % Final   Hematocrit  Date Value Ref Range Status  11/25/2022 42.7 34.0 - 46.6 % Final   MCHC  Date Value Ref Range Status  05/11/2023 31.2 30.0 - 36.0 g/dL Final   Scnetx  Date Value Ref Range Status  05/11/2023 27.4 26.0 - 34.0 pg Final   MCV  Date Value Ref Range Status  05/11/2023 87.8 80.0 - 100.0 fL Final  11/25/2022 91 79 - 97 fL Final   No results found for: "PLTCOUNTKUC", "LABPLAT", "POCPLA" RDW  Date Value Ref Range Status  05/11/2023 13.5 11.5 - 15.5 % Final  11/25/2022 12.8 11.7 - 15.4 % Final

## 2023-10-22 NOTE — Telephone Encounter (Signed)
Requested Prescriptions  Pending Prescriptions Disp Refills   Semaglutide,0.25 or 0.5MG /DOS, (OZEMPIC, 0.25 OR 0.5 MG/DOSE,) 2 MG/3ML SOPN 3 mL 0    Sig: Inject 0.5 mg into the skin once a week.     Endocrinology:  Diabetes - GLP-1 Receptor Agonists - semaglutide Failed - 10/22/2023  1:23 PM      Failed - HBA1C in normal range and within 180 days    HbA1c, POC (controlled diabetic range)  Date Value Ref Range Status  09/16/2018 7.1 (A) 0.0 - 7.0 % Final   Hgb A1c MFr Bld  Date Value Ref Range Status  03/24/2023 6.1 (H) 4.8 - 5.6 % Final    Comment:             Prediabetes: 5.7 - 6.4          Diabetes: >6.4          Glycemic control for adults with diabetes: <7.0          Failed - Cr in normal range and within 360 days    Creat  Date Value Ref Range Status  08/06/2016 0.81 0.50 - 1.10 mg/dL Final   Creatinine, Ser  Date Value Ref Range Status  07/12/2023 1.38 (H) 0.44 - 1.00 mg/dL Final   Creatinine, Urine  Date Value Ref Range Status  08/06/2018 197.25 mg/dL Final    Comment:    Performed at [facility], [address], [place], [place] [address]         Failed - Valid encounter within last 6 months    Recent Outpatient Visits           7 months ago Primary hypertension   [place] [facility] - A Dept Of [place]. [facility] Claiborne Rigg, NP   10 months ago Primary hypertension   [place] [facility] [place] - A Dept Of [place]. [facility] Claiborne Rigg, NP   11 months ago Uncontrolled type 2 diabetes mellitus with hyperglycemia [facility])   [place] [facility] - A Dept Of [place]. [facility] [place], [place], [place]   1 year ago Uncontrolled type 2 diabetes mellitus with hyperglycemia [facility])   [place] [facility] - A Dept Of [place]. [facility] [place], [place], [place]   1 year ago Hospital discharge follow-up   [place] [facility] - A Dept Of [place].  [facility] Claiborne Rigg, NP       Future Appointments             In 1 week Claiborne Rigg, NP [facility] - A Dept Of [place]igh[place] Bridegroom. [facility]   In 1 month Crenshaw, Madolyn Frieze, MD [facility]

## 2023-10-26 ENCOUNTER — Other Ambulatory Visit: Payer: Self-pay

## 2023-10-29 ENCOUNTER — Ambulatory Visit: Payer: Medicaid Other | Attending: Nurse Practitioner | Admitting: Nurse Practitioner

## 2023-10-29 ENCOUNTER — Encounter: Payer: Self-pay | Admitting: Nurse Practitioner

## 2023-10-29 ENCOUNTER — Other Ambulatory Visit: Payer: Self-pay

## 2023-10-29 VITALS — BP 127/94 | HR 104 | Wt 349.4 lb

## 2023-10-29 DIAGNOSIS — L2989 Other pruritus: Secondary | ICD-10-CM | POA: Diagnosis not present

## 2023-10-29 DIAGNOSIS — E669 Obesity, unspecified: Secondary | ICD-10-CM

## 2023-10-29 DIAGNOSIS — Z6841 Body Mass Index (BMI) 40.0 and over, adult: Secondary | ICD-10-CM

## 2023-10-29 DIAGNOSIS — E1165 Type 2 diabetes mellitus with hyperglycemia: Secondary | ICD-10-CM

## 2023-10-29 DIAGNOSIS — Z7985 Long-term (current) use of injectable non-insulin antidiabetic drugs: Secondary | ICD-10-CM

## 2023-10-29 DIAGNOSIS — Z7984 Long term (current) use of oral hypoglycemic drugs: Secondary | ICD-10-CM

## 2023-10-29 DIAGNOSIS — E66813 Obesity, class 3: Secondary | ICD-10-CM

## 2023-10-29 MED ORDER — HYDROXYZINE HCL 10 MG PO TABS
10.0000 mg | ORAL_TABLET | Freq: Three times a day (TID) | ORAL | 0 refills | Status: DC | PRN
Start: 2023-10-29 — End: 2024-01-26
  Filled 2023-10-29: qty 30, 10d supply, fill #0

## 2023-10-29 MED ORDER — DIPHENHYDRAMINE HCL 25 MG PO CAPS
25.0000 mg | ORAL_CAPSULE | Freq: Once | ORAL | Status: AC
Start: 2023-10-29 — End: 2023-10-29
  Administered 2023-10-29: 12.5 mg via ORAL

## 2023-10-29 MED ORDER — SEMAGLUTIDE (1 MG/DOSE) 4 MG/3ML ~~LOC~~ SOPN
1.0000 mg | PEN_INJECTOR | SUBCUTANEOUS | 3 refills | Status: DC
Start: 2023-10-29 — End: 2024-01-26
  Filled 2023-10-29 (×2): qty 3, 28d supply, fill #0
  Filled 2023-11-21: qty 3, 28d supply, fill #1
  Filled 2023-12-20 – 2024-01-03 (×2): qty 3, 28d supply, fill #2

## 2023-10-29 NOTE — Progress Notes (Signed)
Assessment & Plan:  [name] was seen today for medical management of chronic issues.  Diagnoses and all orders for this visit:  Type 2 diabetes mellitus with hyperglycemia, without long-term current use of insulin (HCC) -     Semaglutide, 1 MG/DOSE, 4 MG/3ML SOPN; Inject 1 mg as directed once a week. -     CMP14+EGFR Continue blood sugar control as discussed in office today, low carbohydrate diet, and regular physical exercise as tolerated, 150 minutes per week (30 min each day, 5 days per week, or 50 min 3 days per week). Keep blood sugar logs with fasting goal of 90-130 mg/dl, post prandial (after you eat) less than 180.  For Hypoglycemia: BS <60 and Hyperglycemia BS >400; contact the clinic ASAP. Annual eye exams and foot exams are recommended.   Pruritus due to systemic disorder AVOID all APPLES!! -     hydrOXYzine (ATARAX) 10 MG tablet; Take 1 tablet (10 mg total) by mouth 3 (three) times daily as needed for itching. -     diphenhydrAMINE (BENADRYL) capsule 25 mg    Patient has been counseled on age-appropriate routine health concerns for screening and prevention. These are reviewed and up-to-date. Referrals have been placed accordingly. Immunizations are up-to-date or declined.    Subjective:   Chief Complaint  Patient presents with   Medical Management of Chronic Issues    [name] [age] y.o. female presents to office today for follow up to HTN  Unfortunately her A1c was not drawn in office or ordered via lab work today. We will have her return for blood work  She Declines mammogram  PMH significant for:  AonC diastolic congestive heart failure (11/02/2013), Benign essential HTN (11/28/2013), Bipolar disease, chronic, Cor pulmonale, History of thyrotoxicosis, DM2,  Mediastinal adenopathy,  Morbid obesity with BMI >50, OSA (obstructive sleep apnea) DOES NOT USE BIPAP,   Persistent Afib on xarelto (12/09/2017), Pulmonary HTN, moderate to severe (11/03/2013),  SVT (12/06/2013)      Initial Blood pressure was low 86/56. She was given 16oz of water and BP now 127/94. She also noted to repeatedly scratch and rub her nose stating this occurs every time she eats apples. She also states she experiences itching on her legs as well. States she does not feel good today. We gave her 12.5mg  of benadryl in office today for allergy symptoms. She endorses adherence with taking her medications as follows: carvedilol 25 mg BID, taking hydralazine 25 mg BID and not TID, entresto 97-103 mg BID, spironolactone 25 mg daily, amlodipine 5 mg daily and torsemide 60 mg daily.  BP Readings from Last 3 Encounters:  10/29/23 (!) 127/94  07/12/23 130/84  06/17/23 114/68       Review of Systems  Constitutional:  Negative for fever, malaise/fatigue and weight loss.  HENT: Negative.  Negative for nosebleeds.   Eyes: Negative.  Negative for blurred vision, double vision and photophobia.  Respiratory: Negative.  Negative for cough and shortness of breath.   Cardiovascular: Negative.  Negative for chest pain, palpitations and leg swelling.  Gastrointestinal: Negative.  Negative for heartburn, nausea and vomiting.  Musculoskeletal: Negative.  Negative for myalgias.  Skin:  Positive for itching.  Neurological:  Positive for dizziness. Negative for focal weakness, seizures and headaches.  Psychiatric/Behavioral: Negative.  Negative for suicidal ideas.     Past Medical History:  Diagnosis Date   Acute on chronic diastolic congestive heart failure (HCC) 11/02/2013   10/03/2015, 11/13/2015, 08/03/2017   Atrial fibrillation (HCC)  Benign essential HTN 11/28/2013   Bipolar disease, chronic (HCC)    Chest pain    a. 2012 Myoview: EF 63%, no isch/infarct;  b. 04/2016 Lexiscan MV: EF 73%, no ischemia/infarct-->Low risk.   Chronic diastolic CHF (congestive heart failure) (HCC) 07/23/2011   a. 2015 Echo: EF 55-60%, Gr2 DD;  b. 09/2015 Echo: EF 60-65%, no rwma, mod dil LA, PASP .   Cor pulmonale  (chronic) (HCC)    History of thyrotoxicosis    HTN (hypertension) 11/28/2013   Hypertensive heart disease 10/18/2013   Hypoglycemia    Insulin dependent type 2 diabetes mellitus, uncontrolled    Mediastinal adenopathy    Morbid obesity due to excess calories (HCC) 02/19/2011   Morbid obesity with BMI of 50.0-59.9, adult (HCC)    OSA (obstructive sleep apnea) 03/06/2011   Persistent atrial fibrillation (HCC) 12/09/2017   Pulmonary HTN, moderate to severe 11/03/2013   Sinusitis, chronic 01/02/2015   SVT (supraventricular tachycardia) (HCC) 12/06/2013   Uncontrolled type 2 diabetes mellitus with hyperglycemia [facility])     Past Surgical History:  Procedure Laterality Date   CARDIOVERSION N/A 04/05/2018   Procedure: CARDIOVERSION;  Surgeon: Lewayne Bunting, MD;  Location: [facility] ENDOSCOPY;  Service: Cardiovascular;  Laterality: N/A;   COLONOSCOPY WITH PROPOFOL Left 07/16/2018   Procedure: COLONOSCOPY WITH PROPOFOL;  Surgeon: Kerin Salen, MD;  Location: [facility] ENDOSCOPY;  Service: Gastroenterology;  Laterality: Left;   LEFT HEART CATH AND CORONARY ANGIOGRAPHY N/A 08/04/2018   Procedure: LEFT HEART CATH AND CORONARY ANGIOGRAPHY;  Surgeon: Lyn Records, MD;  Location: [facility] INVASIVE CV LAB;  Service: Cardiovascular;  Laterality: N/A;   None     POLYPECTOMY  07/16/2018   Procedure: POLYPECTOMY;  Surgeon: Kerin Salen, MD;  Location: [facility] ENDOSCOPY;  Service: Gastroenterology;;    Family History  Problem Relation Age of Onset   Heart failure Father    Stroke Father    Hypertension Mother    Heart disease Maternal Grandfather     Social History Reviewed with no changes to be made today.   Outpatient Medications Prior to Visit  Medication Sig Dispense Refill   Accu-Chek Softclix Lancets lancets Use 2 (two) times daily. 100 each 1   amLODipine (NORVASC) 5 MG tablet Take 1 tablet (5 mg total) by mouth daily. 90 tablet 3   Blood Glucose Monitoring Suppl (ACCU-CHEK GUIDE) w/Device KIT use to check blood  sugar 1 kit 0   carvedilol (COREG) 25 MG tablet Take 1 tablet (25 mg total) by mouth 2 (two) times daily with a meal. 180 tablet 1   glipiZIDE (GLUCOTROL XL) 10 MG 24 hr tablet Take 1 tablet (10 mg total) by mouth daily with breakfast. 90 tablet 1   glucose blood (TRUE METRIX BLOOD GLUCOSE TEST) test strip Use as instructed 100 each 12   hydrALAZINE (APRESOLINE) 25 MG tablet Take 1 tablet (25 mg total) by mouth 2 (two) times daily at 10 AM and 5 PM. 60 tablet 0   Incontinence Supply Disposable (COMFORT SHIELD ADULT DIAPERS) MISC Please provide with sixe 2-3xl adult diapers. N39.3 48 each 12   Incontinence Supply Disposable (INCONTINENCE BRIEF LARGE) MISC Please provide patient with insurance approved incontinence supplies/briefs 18 each 6   metFORMIN (GLUCOPHAGE) 1000 MG tablet Take 1 tablet (1,000 mg total) by mouth 2 (two) times daily with a meal. 60 tablet 0   risperiDONE (RISPERDAL) 2 MG tablet Take 1 tablet (2 mg total) by mouth at bedtime. 30 tablet 3   rivaroxaban (XARELTO) 20 MG  TABS tablet Take 1 tablet (20 mg total) by mouth daily with supper. 30 tablet 5   sacubitril-valsartan (ENTRESTO) 97-103 MG Take 1 tablet by mouth 2 (two) times daily. 60 tablet 10   spironolactone (ALDACTONE) 25 MG tablet Take 1 tablet (25 mg total) by mouth daily. 30 tablet 0   torsemide (DEMADEX) 20 MG tablet Take 4 tablets (80 mg total) by mouth daily. 120 tablet 5   hydrOXYzine (VISTARIL) 25 MG capsule Take 1 capsule (25 mg total) by mouth every 8 (eight) hours as needed. (Patient taking differently: Take 25 mg by mouth daily.) 60 capsule 1   Semaglutide,0.25 or 0.5MG /DOS, (OZEMPIC, 0.25 OR 0.5 MG/DOSE,) 2 MG/3ML SOPN Inject 0.5 mg into the skin once a week. Must keep upcoming appt for further refills 3 mL 0   No facility-administered medications prior to visit.    Allergies  Allergen Reactions   Acetaminophen Other (See Comments)    Seizure-like "fits" as a child   Caffeine     Tense, anxiety, increased  urination   [place] [Dapagliflozin] Other (See Comments)    Hallucinations, drop in blood sugar   Pork-Derived Products Other (See Comments)    Religious reasons. Patient verified on 1/26 okay to get heparin products if necessary   Lisinopril Rash    Rash with lisinopril; but fosinopril is ok per patient       Objective:    BP (!) 127/94   Pulse (!) 104   Wt (!) 349 lb 6.4 oz (158.5 kg)   SpO2 94%   BMI 54.72 kg/m  Wt Readings from Last 3 Encounters:  10/29/23 (!) 349 lb 6.4 oz (158.5 kg)  07/12/23 (!) 340 lb 12.8 oz (154.6 kg)  06/17/23 (!) 344 lb (156 kg)    Physical Exam Vitals and nursing note reviewed.  Constitutional:      Appearance: She is well-developed.  HENT:     Head: Normocephalic and atraumatic.  Cardiovascular:     Rate and Rhythm: Normal rate and regular rhythm.     Heart sounds: Normal heart sounds. No murmur heard.    No friction rub. No gallop.  Pulmonary:     Effort: Pulmonary effort is normal. No tachypnea or respiratory distress.     Breath sounds: Normal breath sounds. No decreased breath sounds, wheezing, rhonchi or rales.  Chest:     Chest wall: No tenderness.  Abdominal:     General: Bowel sounds are normal.     Palpations: Abdomen is soft.  Musculoskeletal:        General: Normal range of motion.     Cervical back: Normal range of motion.  Skin:    General: Skin is warm and dry.  Neurological:     Mental Status: She is alert and oriented to person, place, and time.     Coordination: Coordination normal.  Psychiatric:        Behavior: Behavior normal. Behavior is cooperative.        Thought Content: Thought content normal.        Judgment: Judgment normal.          Patient has been counseled extensively about nutrition and exercise as well as the importance of adherence with medications and regular follow-up. The patient was given clear instructions to go to ER or return to medical center if symptoms don't improve, worsen or new  problems develop. The patient verbalized understanding.   Follow-up: Return in about 3 months (around 01/26/2024) for schedule pap smear.  Claiborne Rigg, FNP-BC [facility] and [facility] [place], [place] [phone]   10/29/2023, 6:04 PM

## 2023-10-29 NOTE — Progress Notes (Signed)
Ben- [phone]

## 2023-10-29 NOTE — Patient Instructions (Signed)
Schedule for next visit PAP smear

## 2023-10-30 ENCOUNTER — Encounter: Payer: Self-pay | Admitting: Nurse Practitioner

## 2023-10-30 LAB — CMP14+EGFR
ALT: 12 [IU]/L (ref 0–32)
AST: 23 [IU]/L (ref 0–40)
Albumin: 4.2 g/dL (ref 3.8–4.9)
Alkaline Phosphatase: 111 [IU]/L (ref 44–121)
BUN/Creatinine Ratio: 15 (ref 9–23)
BUN: 17 mg/dL (ref 6–24)
Bilirubin Total: 0.5 mg/dL (ref 0.0–1.2)
CO2: 22 mmol/L (ref 20–29)
Calcium: 9 mg/dL (ref 8.7–10.2)
Chloride: 102 mmol/L (ref 96–106)
Creatinine, Ser: 1.16 mg/dL — ABNORMAL HIGH (ref 0.57–1.00)
Globulin, Total: 3.1 g/dL (ref 1.5–4.5)
Glucose: 133 mg/dL — ABNORMAL HIGH (ref 70–99)
Potassium: 4 mmol/L (ref 3.5–5.2)
Sodium: 140 mmol/L (ref 134–144)
Total Protein: 7.3 g/dL (ref 6.0–8.5)
eGFR: 56 mL/min/{1.73_m2} — ABNORMAL LOW (ref >59)

## 2023-11-10 ENCOUNTER — Other Ambulatory Visit: Payer: Self-pay | Admitting: Family Medicine

## 2023-11-10 DIAGNOSIS — I10 Essential (primary) hypertension: Secondary | ICD-10-CM

## 2023-11-11 ENCOUNTER — Other Ambulatory Visit: Payer: Self-pay

## 2023-11-11 MED ORDER — SPIRONOLACTONE 25 MG PO TABS
25.0000 mg | ORAL_TABLET | Freq: Every day | ORAL | 0 refills | Status: DC
Start: 2023-11-11 — End: 2024-01-26
  Filled 2023-11-11: qty 90, 90d supply, fill #0

## 2023-11-11 NOTE — Telephone Encounter (Signed)
Requested Prescriptions  Pending Prescriptions Disp Refills   spironolactone (ALDACTONE) 25 MG tablet 90 tablet 0    Sig: Take 1 tablet (25 mg total) by mouth daily.     Cardiovascular: Diuretics - Aldosterone Antagonist Failed - 11/11/2023  1:25 PM      Failed - Cr in normal range and within 180 days    Creat  Date Value Ref Range Status  08/06/2016 0.81 0.50 - 1.10 mg/dL Final   Creatinine, Ser  Date Value Ref Range Status  10/29/2023 1.16 (H) 0.57 - 1.00 mg/dL Final   Creatinine, Urine  Date Value Ref Range Status  08/06/2018 197.25 mg/dL Final    Comment:    Performed at [facility], [address], [place], [place] [address]         Failed - Last BP in normal range    BP Readings from Last 1 Encounters:  10/29/23 (!) 127/94         Passed - K in normal range and within 180 days    Potassium  Date Value Ref Range Status  10/29/2023 4.0 3.5 - 5.2 mmol/L Final         Passed - Na in normal range and within 180 days    Sodium  Date Value Ref Range Status  10/29/2023 140 134 - 144 mmol/L Final         Passed - eGFR is 30 or above and within 180 days    GFR, Est African American  Date Value Ref Range Status  08/06/2016 >89 >=60 mL/min Final   GFR calc Af Amer  Date Value Ref Range Status  09/17/2020 77 >59 mL/min/1.73 Final    Comment:    **In accordance with recommendations from the NKF-ASN Task force,**   [facility] is in the process of updating its eGFR calculation to the   2021 CKD-EPI creatinine equation that estimates kidney function   without a race variable.    GFR, Est Non African American  Date Value Ref Range Status  08/06/2016 86 >=60 mL/min Final   GFR, Estimated  Date Value Ref Range Status  07/12/2023 45 (L) >60 mL/min Final    Comment:    (NOTE) Calculated using the CKD-EPI Creatinine Equation (2021)    GFR  Date Value Ref Range Status  12/25/2013 117.88 >60.00 mL/min Final   eGFR  Date Value Ref Range Status   10/29/2023 56 (L) >59 mL/min/1.73 Final         Passed - Valid encounter within last 6 months    Recent Outpatient Visits           1 week ago Type 2 diabetes mellitus with hyperglycemia, without long-term current use of insulin (HCC)   [place] [facility] [place] - A Dept Of [place]. [facility] Claiborne Rigg, NP   7 months ago Primary hypertension   [place] [facility] [place] - A Dept Of [place]. [facility] Claiborne Rigg, NP   10 months ago Primary hypertension   [place] [facility] [place] - A Dept Of [place]. [facility] Claiborne Rigg, NP   11 months ago Uncontrolled type 2 diabetes mellitus with hyperglycemia [facility])   [place] [facility] - A Dept Of [place]. [facility] [place], [place], [place]   1 year ago Uncontrolled type 2 diabetes mellitus with hyperglycemia [facility])   [place] [facility] - A Dept Of [place].  [facility], [place], [place]       Future Appointments             In 2 weeks Jens Som, Madolyn Frieze, MD [facility] [place]   In 2 months Claiborne Rigg, NP [facility] - A Dept Of [place]. [facility]

## 2023-11-22 ENCOUNTER — Other Ambulatory Visit: Payer: Self-pay

## 2023-11-23 ENCOUNTER — Other Ambulatory Visit: Payer: Self-pay

## 2023-11-27 NOTE — Progress Notes (Unsigned)
 HPI: Follow-up congestive heart failure, atrial fibrillation.  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  Patient developed acute kidney injury following the catheterization.  Echocardiogram August 2023 showed normal LV function, severe left ventricular hypertrophy, moderate pulmonary hypertension, severe left atrial enlargement, small pericardial effusion.  Cardiac MRI December 2023 showed severe left atrial enlargement, moderate right atrial enlargement, normal LV function and RV function, abnormal delayed gadolinium images and further workup for sarcoid or hypertrophic cardiomyopathy recommended with PET/CT, small pericardial effusion.  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  Monitor February 2024 showed atrial fibrillation and PVCs.  Patient was evaluated by Dr. Nelly Laurence but she declined ICD.  She has been followed in the heart failure clinic.  Since last seen  Current Outpatient Medications  Medication Sig Dispense Refill   Accu-Chek Softclix Lancets lancets Use 2 (two) times daily. 100 each 1   amLODipine (NORVASC) 5 MG tablet Take 1 tablet (5 mg total) by mouth daily. 90 tablet 3   Blood Glucose Monitoring Suppl (ACCU-CHEK GUIDE) w/Device KIT use to check blood sugar 1 kit 0   carvedilol (COREG) 25 MG tablet Take 1 tablet (25 mg total) by mouth 2 (two) times daily with a meal. 180 tablet 1   glipiZIDE (GLUCOTROL XL) 10 MG 24 hr tablet Take 1 tablet (10 mg total) by mouth daily with breakfast. 90 tablet 1   glucose blood (TRUE METRIX BLOOD GLUCOSE TEST) test strip Use as instructed 100 each 12   hydrALAZINE (APRESOLINE) 25 MG tablet Take 1 tablet (25 mg total) by mouth 2 (two) times daily at 10 AM and 5 PM. 60 tablet 0   hydrOXYzine  (ATARAX) 10 MG tablet Take 1 tablet (10 mg total) by mouth 3 (three) times daily as needed for itching. 30 tablet 0   Incontinence Supply Disposable (COMFORT SHIELD ADULT DIAPERS) MISC Please provide with sixe 2-3xl adult diapers. N39.3 48 each 12   Incontinence Supply Disposable (INCONTINENCE BRIEF LARGE) MISC Please provide patient with insurance approved incontinence supplies/briefs 18 each 6   metFORMIN (GLUCOPHAGE) 1000 MG tablet Take 1 tablet (1,000 mg total) by mouth 2 (two) times daily with a meal. 60 tablet 0   risperiDONE (RISPERDAL) 2 MG tablet Take 1 tablet (2 mg total) by mouth at bedtime. 30 tablet 3   rivaroxaban (XARELTO) 20 MG TABS tablet Take 1 tablet (20 mg total) by mouth daily with supper. 30 tablet 5   sacubitril-valsartan (ENTRESTO) 97-103 MG Take 1 tablet by mouth 2 (two) times daily. 60 tablet 10   Semaglutide, 1 MG/DOSE, 4 MG/3ML SOPN Inject 1 mg as directed once a week. 3 mL 3   spironolactone (ALDACTONE) 25 MG tablet Take 1 tablet (25 mg total) by mouth daily. 90 tablet 0   torsemide (DEMADEX) 20 MG tablet Take 4 tablets (80 mg total) by mouth daily. 120 tablet 5   No current facility-administered medications for this visit.     Past Medical History:  Diagnosis Date   Acute on chronic diastolic congestive heart failure (HCC) 11/02/2013   10/03/2015, 11/13/2015, 08/03/2017   Atrial fibrillation (HCC)    Benign essential HTN 11/28/2013   Bipolar disease, chronic (HCC)    Chest pain    a. 2012 Myoview: EF 63%, no isch/infarct;  b. 04/2016 Lexiscan MV:  EF 73%, no ischemia/infarct-->Low risk.   Chronic diastolic CHF (congestive heart failure) (HCC) 07/23/2011   a. 2015 Echo: EF 55-60%, Gr2 DD;  b. 09/2015 Echo: EF 60-65%, no rwma, mod dil LA, PASP .   Cor pulmonale (chronic) (HCC)    History of thyrotoxicosis    HTN (hypertension) 11/28/2013   Hypertensive heart disease 10/18/2013   Hypoglycemia    Insulin dependent type 2 diabetes mellitus, uncontrolled     Mediastinal adenopathy    Morbid obesity due to excess calories (HCC) 02/19/2011   Morbid obesity with BMI of 50.0-59.9, adult (HCC)    OSA (obstructive sleep apnea) 03/06/2011   Persistent atrial fibrillation (HCC) 12/09/2017   Pulmonary HTN, moderate to severe 11/03/2013   Sinusitis, chronic 01/02/2015   SVT (supraventricular tachycardia) (HCC) 12/06/2013   Uncontrolled type 2 diabetes mellitus with hyperglycemia [facility])     Past Surgical History:  Procedure Laterality Date   CARDIOVERSION N/A 04/05/2018   Procedure: CARDIOVERSION;  Surgeon: Lewayne Bunting, MD;  Location: [facility] ENDOSCOPY;  Service: Cardiovascular;  Laterality: N/A;   COLONOSCOPY WITH PROPOFOL Left 07/16/2018   Procedure: COLONOSCOPY WITH PROPOFOL;  Surgeon: Kerin Salen, MD;  Location: [facility] ENDOSCOPY;  Service: Gastroenterology;  Laterality: Left;   LEFT HEART CATH AND CORONARY ANGIOGRAPHY N/A 08/04/2018   Procedure: LEFT HEART CATH AND CORONARY ANGIOGRAPHY;  Surgeon: Lyn Records, MD;  Location: [facility] INVASIVE CV LAB;  Service: Cardiovascular;  Laterality: N/A;   None     POLYPECTOMY  07/16/2018   Procedure: POLYPECTOMY;  Surgeon: Kerin Salen, MD;  Location: [facility] ENDOSCOPY;  Service: Gastroenterology;;    Social History   Socioeconomic History   Marital status: Single    Spouse name: Not on file   Number of children: 0   Years of education: 27   Highest education level: Not on file  Occupational History   Occupation: unemployed  Tobacco Use   Smoking status: Never   Smokeless tobacco: Never  Vaping Use   Vaping status: Never Used  Substance and Sexual Activity   Alcohol use: No   Drug use: No   Sexual activity: Not Currently    Birth control/protection: None  Other Topics Concern   Not on file  Social History Narrative   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 But ran out of money and is no longer attending school. (Note patient has  bipolar disorder).      Born in [place] but lived in [place] before coming back to [place].       Primary language is Arabic. Lives with mother and brother.   Social Drivers of Corporate investment banker Strain: Low Risk  (05/26/2022)   Overall Financial Resource Strain (CARDIA)    Difficulty of Paying Living Expenses: Not very hard  Food Insecurity: No Food Insecurity (10/23/2022)   Hunger Vital Sign    Worried About Running Out of Food in the Last Year: Never true    Ran Out of Food in the Last Year: Never true  Transportation Needs: Unmet Transportation Needs (10/23/2022)   PRAPARE - Transportation    Lack of Transportation (Medical): Yes    Lack of Transportation (Non-Medical): Yes  Physical Activity: Not on file  Stress: No Stress Concern Present (07/15/2018)   [employer] of Occupational Health - Occupational Stress Questionnaire    Feeling of Stress : Only a little  Social Connections: Not on file  Intimate Partner Violence: Not At Risk (10/23/2022)   Humiliation, Afraid, Rape, and Kick questionnaire    Fear of Current or Ex-Partner: No    Emotionally Abused: No    Physically Abused: No    Sexually Abused: No    Family History  Problem Relation Age of Onset   Heart failure Father    Stroke Father    Hypertension Mother    Heart disease Maternal Grandfather     ROS: no fevers or chills, productive cough, hemoptysis, dysphasia, odynophagia, melena, hematochezia, dysuria, hematuria, rash, seizure activity, orthopnea, PND, pedal edema, claudication. Remaining systems are negative.  Physical Exam: Well-developed well-nourished in no acute distress.  Skin is warm and dry.  HEENT is normal.  Neck is supple.  Chest is clear to auscultation with normal expansion.  Cardiovascular exam is regular rate and rhythm.  Abdominal exam nontender or distended. No masses palpated. Extremities show no edema. neuro grossly intact  ECG- personally reviewed  A/P  1 chronic diastolic  congestive heart failure-patient appears to be euvolemic on examination.  Will continue Demadex, spironolactone at present dosing.  Will also continue Entresto and carvedilol.  Note she did not tolerate Jardiance previously.  Also note compliance has been an issue for this patient.  Previously referred to Dr. Sidney Ace for genetic counseling.  2 hypertension-patient's blood pressure is controlled.  Continue present medical regimen.  3 permanent atrial fibrillation-continue carvedilol for rate control.  Continue Xarelto.  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  This is being managed medically.  Notes she has had difficulties with chronic chest pain in the past.  5 obstructive sleep apnea-patient is not compliant with CPAP.  6 morbid obesity-we discussed the importance of weight loss.  7 risk of sudden cardiac death-patient has declined ICD and understands the risk of sudden cardiac death.  Olga Millers, MD

## 2023-11-29 ENCOUNTER — Encounter: Payer: Self-pay | Admitting: Cardiology

## 2023-11-29 ENCOUNTER — Ambulatory Visit: Payer: Medicaid Other | Attending: Cardiology | Admitting: Cardiology

## 2023-11-29 VITALS — BP 100/80 | HR 94 | Ht 67.0 in | Wt 348.6 lb

## 2023-11-29 DIAGNOSIS — I10 Essential (primary) hypertension: Secondary | ICD-10-CM

## 2023-11-29 DIAGNOSIS — R072 Precordial pain: Secondary | ICD-10-CM

## 2023-11-29 DIAGNOSIS — I5032 Chronic diastolic (congestive) heart failure: Secondary | ICD-10-CM | POA: Diagnosis not present

## 2023-11-29 DIAGNOSIS — Q245 Malformation of coronary vessels: Secondary | ICD-10-CM | POA: Diagnosis not present

## 2023-11-29 DIAGNOSIS — I482 Chronic atrial fibrillation, unspecified: Secondary | ICD-10-CM

## 2023-11-29 LAB — CBC

## 2023-11-29 NOTE — Patient Instructions (Signed)
 Medication Instructions:   NO CHANGE  *If you need a refill on your cardiac medications before your next appointment, please call your pharmacy*   Lab Work:  Your physician recommends that you HAVE LAB WORK TODAY  If you have labs (blood work) drawn today and your tests are completely normal, you will receive your results only by: MyChart Message (if you have MyChart) OR A paper copy in the mail If you have any lab test that is abnormal or we need to change your treatment, we will call you to review the results.   Follow-Up: At [facility], you and your health needs are our priority.  As part of our continuing mission to provide you with exceptional heart care, we have created designated Provider Care Teams.  These Care Teams include your primary Cardiologist (physician) and Advanced Practice Providers (APPs -  Physician Assistants and Nurse Practitioners) who all work together to provide you with the care you need, when you need it.  We recommend signing up for the patient portal called "MyChart".  Sign up information is provided on this After Visit Summary.  MyChart is used to connect with patients for Virtual Visits (Telemedicine).  Patients are able to view lab/test results, encounter notes, upcoming appointments, etc.  Non-urgent messages can be sent to your provider as well.   To learn more about what you can do with MyChart, go to [email or web address].    Your next appointment:   12 month(s)  Provider:   Olga Millers, MD     Other Instructions  NEEDS ECHO AND FOLLOW UP WITH [facility]

## 2023-11-30 ENCOUNTER — Encounter: Payer: Self-pay | Admitting: *Deleted

## 2023-11-30 LAB — BASIC METABOLIC PANEL
BUN/Creatinine Ratio: 19 (ref 9–23)
BUN: 22 mg/dL (ref 6–24)
CO2: 22 mmol/L (ref 20–29)
Calcium: 9.4 mg/dL (ref 8.7–10.2)
Chloride: 106 mmol/L (ref 96–106)
Creatinine, Ser: 1.14 mg/dL — ABNORMAL HIGH (ref 0.57–1.00)
Glucose: 92 mg/dL (ref 70–99)
Potassium: 4.2 mmol/L (ref 3.5–5.2)
Sodium: 142 mmol/L (ref 134–144)
eGFR: 57 mL/min/{1.73_m2} — ABNORMAL LOW (ref >59)

## 2023-11-30 LAB — CBC
Hematocrit: 36.8 % (ref 34.0–46.6)
Hemoglobin: 11.3 g/dL (ref 11.1–15.9)
MCH: 27.4 pg (ref 26.6–33.0)
MCHC: 30.7 g/dL — ABNORMAL LOW (ref 31.5–35.7)
MCV: 89 fL (ref 79–97)
Platelets: 195 10*3/uL (ref 150–450)
RBC: 4.13 x10E6/uL (ref 3.77–5.28)
RDW: 13.2 % (ref 11.7–15.4)
WBC: 4.4 10*3/uL (ref 3.4–10.8)

## 2023-12-07 ENCOUNTER — Encounter: Payer: Medicaid Other | Admitting: Genetic Counselor

## 2023-12-08 ENCOUNTER — Other Ambulatory Visit: Payer: Self-pay | Admitting: Nurse Practitioner

## 2023-12-08 ENCOUNTER — Other Ambulatory Visit: Payer: Self-pay

## 2023-12-08 DIAGNOSIS — I10 Essential (primary) hypertension: Secondary | ICD-10-CM

## 2023-12-08 DIAGNOSIS — I4819 Other persistent atrial fibrillation: Secondary | ICD-10-CM

## 2023-12-08 DIAGNOSIS — E1165 Type 2 diabetes mellitus with hyperglycemia: Secondary | ICD-10-CM

## 2023-12-08 MED ORDER — HYDRALAZINE HCL 25 MG PO TABS
25.0000 mg | ORAL_TABLET | Freq: Two times a day (BID) | ORAL | 1 refills | Status: DC
Start: 2023-12-08 — End: 2024-01-26
  Filled 2023-12-08: qty 180, 90d supply, fill #0

## 2023-12-08 MED ORDER — CARVEDILOL 25 MG PO TABS
25.0000 mg | ORAL_TABLET | Freq: Two times a day (BID) | ORAL | 1 refills | Status: DC
Start: 2023-12-08 — End: 2024-01-26
  Filled 2023-12-08: qty 180, 90d supply, fill #0

## 2023-12-08 MED ORDER — METFORMIN HCL 1000 MG PO TABS
1000.0000 mg | ORAL_TABLET | Freq: Two times a day (BID) | ORAL | 1 refills | Status: DC
Start: 2023-12-08 — End: 2024-01-26
  Filled 2023-12-08: qty 180, 90d supply, fill #0

## 2023-12-08 MED ORDER — GLIPIZIDE ER 10 MG PO TB24
10.0000 mg | ORAL_TABLET | Freq: Every day | ORAL | 1 refills | Status: DC
Start: 2023-12-08 — End: 2024-01-26
  Filled 2023-12-08: qty 90, 90d supply, fill #0

## 2023-12-20 ENCOUNTER — Other Ambulatory Visit: Payer: Self-pay

## 2023-12-27 ENCOUNTER — Ambulatory Visit: Attending: Genetic Counselor | Admitting: Genetic Counselor

## 2023-12-30 ENCOUNTER — Other Ambulatory Visit: Payer: Self-pay

## 2024-01-02 ENCOUNTER — Other Ambulatory Visit: Payer: Self-pay | Admitting: Nurse Practitioner

## 2024-01-02 DIAGNOSIS — F32A Depression, unspecified: Secondary | ICD-10-CM

## 2024-01-03 ENCOUNTER — Other Ambulatory Visit: Payer: Self-pay

## 2024-01-03 MED ORDER — RISPERIDONE 2 MG PO TABS
2.0000 mg | ORAL_TABLET | Freq: Every day | ORAL | 0 refills | Status: DC
Start: 2024-01-03 — End: 2024-01-26
  Filled 2024-01-03: qty 30, 30d supply, fill #0

## 2024-01-04 ENCOUNTER — Other Ambulatory Visit: Payer: Self-pay

## 2024-01-10 ENCOUNTER — Other Ambulatory Visit: Payer: Self-pay

## 2024-01-12 ENCOUNTER — Other Ambulatory Visit: Payer: Self-pay

## 2024-01-26 ENCOUNTER — Other Ambulatory Visit: Payer: Self-pay

## 2024-01-26 ENCOUNTER — Encounter: Payer: Self-pay | Admitting: Nurse Practitioner

## 2024-01-26 ENCOUNTER — Ambulatory Visit: Payer: Medicaid Other | Attending: Nurse Practitioner | Admitting: Nurse Practitioner

## 2024-01-26 VITALS — BP 122/84 | HR 94 | Resp 19 | Ht 67.0 in | Wt 342.0 lb

## 2024-01-26 DIAGNOSIS — Z7984 Long term (current) use of oral hypoglycemic drugs: Secondary | ICD-10-CM | POA: Diagnosis not present

## 2024-01-26 DIAGNOSIS — Z7985 Long-term (current) use of injectable non-insulin antidiabetic drugs: Secondary | ICD-10-CM

## 2024-01-26 DIAGNOSIS — F418 Other specified anxiety disorders: Secondary | ICD-10-CM | POA: Diagnosis not present

## 2024-01-26 DIAGNOSIS — I4819 Other persistent atrial fibrillation: Secondary | ICD-10-CM

## 2024-01-26 DIAGNOSIS — I10 Essential (primary) hypertension: Secondary | ICD-10-CM | POA: Diagnosis not present

## 2024-01-26 DIAGNOSIS — L2989 Other pruritus: Secondary | ICD-10-CM | POA: Diagnosis not present

## 2024-01-26 DIAGNOSIS — E1165 Type 2 diabetes mellitus with hyperglycemia: Secondary | ICD-10-CM

## 2024-01-26 DIAGNOSIS — J3489 Other specified disorders of nose and nasal sinuses: Secondary | ICD-10-CM

## 2024-01-26 DIAGNOSIS — N3946 Mixed incontinence: Secondary | ICD-10-CM

## 2024-01-26 DIAGNOSIS — F419 Anxiety disorder, unspecified: Secondary | ICD-10-CM

## 2024-01-26 LAB — POCT GLYCOSYLATED HEMOGLOBIN (HGB A1C): HbA1c, POC (controlled diabetic range): 6.2 % (ref 0.0–7.0)

## 2024-01-26 LAB — GLUCOSE, POCT (MANUAL RESULT ENTRY): POC Glucose: 152 mg/dL — AB (ref 70–99)

## 2024-01-26 MED ORDER — ENTRESTO 97-103 MG PO TABS
1.0000 | ORAL_TABLET | Freq: Two times a day (BID) | ORAL | 10 refills | Status: AC
  Filled 2024-01-26 – 2024-02-08 (×2): qty 60, 30d supply, fill #0
  Filled 2024-03-14 (×2): qty 60, 30d supply, fill #1
  Filled 2024-04-18: qty 60, 30d supply, fill #2
  Filled 2024-06-06: qty 60, 30d supply, fill #3
  Filled 2024-08-14: qty 60, 30d supply, fill #4

## 2024-01-26 MED ORDER — RISPERIDONE 2 MG PO TABS
2.0000 mg | ORAL_TABLET | Freq: Every day | ORAL | 1 refills | Status: DC
Start: 2024-01-26 — End: 2024-05-23
  Filled 2024-01-26: qty 90, 90d supply, fill #0
  Filled 2024-02-08: qty 30, 30d supply, fill #0
  Filled 2024-03-14 (×2): qty 30, 30d supply, fill #1
  Filled 2024-04-18: qty 30, 30d supply, fill #2

## 2024-01-26 MED ORDER — LEVOCETIRIZINE DIHYDROCHLORIDE 5 MG PO TABS
5.0000 mg | ORAL_TABLET | Freq: Every evening | ORAL | 6 refills | Status: AC
  Filled 2024-01-26: qty 30, 30d supply, fill #0

## 2024-01-26 MED ORDER — METFORMIN HCL 1000 MG PO TABS
1000.0000 mg | ORAL_TABLET | Freq: Two times a day (BID) | ORAL | 1 refills | Status: DC
  Filled 2024-01-26 – 2024-03-14 (×3): qty 180, 90d supply, fill #0
  Filled 2024-08-14: qty 60, 30d supply, fill #1

## 2024-01-26 MED ORDER — AMLODIPINE BESYLATE 5 MG PO TABS
5.0000 mg | ORAL_TABLET | Freq: Every day | ORAL | 3 refills | Status: DC
Start: 2024-01-26 — End: 2024-05-15
  Filled 2024-01-26: qty 90, 90d supply, fill #0

## 2024-01-26 MED ORDER — HYDRALAZINE HCL 25 MG PO TABS
25.0000 mg | ORAL_TABLET | Freq: Two times a day (BID) | ORAL | 1 refills | Status: DC
  Filled 2024-01-26 – 2024-03-14 (×3): qty 180, 90d supply, fill #0
  Filled 2024-08-14: qty 60, 30d supply, fill #1

## 2024-01-26 MED ORDER — SEMAGLUTIDE (1 MG/DOSE) 4 MG/3ML ~~LOC~~ SOPN
1.0000 mg | PEN_INJECTOR | SUBCUTANEOUS | 3 refills | Status: DC
Start: 2024-01-26 — End: 2024-07-18
  Filled 2024-01-26 – 2024-02-08 (×2): qty 3, 28d supply, fill #0
  Filled 2024-03-14 (×2): qty 3, 28d supply, fill #1
  Filled 2024-04-18: qty 3, 28d supply, fill #2
  Filled 2024-06-06: qty 3, 28d supply, fill #3

## 2024-01-26 MED ORDER — SPIRONOLACTONE 25 MG PO TABS
25.0000 mg | ORAL_TABLET | Freq: Every day | ORAL | 1 refills | Status: DC
Start: 2024-01-26 — End: 2024-05-23
  Filled 2024-01-26 – 2024-02-08 (×2): qty 90, 90d supply, fill #0

## 2024-01-26 MED ORDER — GLIPIZIDE ER 10 MG PO TB24
10.0000 mg | ORAL_TABLET | Freq: Every day | ORAL | 1 refills | Status: DC
  Filled 2024-01-26 – 2024-03-14 (×2): qty 90, 90d supply, fill #0
  Filled 2024-08-14: qty 30, 30d supply, fill #1

## 2024-01-26 MED ORDER — CARVEDILOL 25 MG PO TABS
25.0000 mg | ORAL_TABLET | Freq: Two times a day (BID) | ORAL | 1 refills | Status: DC
  Filled 2024-01-26 – 2024-03-14 (×2): qty 180, 90d supply, fill #0
  Filled 2024-08-14: qty 60, 30d supply, fill #1

## 2024-01-26 MED ORDER — HYDROXYZINE HCL 10 MG PO TABS
10.0000 mg | ORAL_TABLET | Freq: Three times a day (TID) | ORAL | 1 refills | Status: AC | PRN
  Filled 2024-01-26: qty 60, 20d supply, fill #0

## 2024-01-26 NOTE — Progress Notes (Signed)
 Assessment & Plan:  [name] was seen today for fatigue.  Diagnoses and all orders for this visit:  Type 2 diabetes mellitus with hyperglycemia, without long-term current use of insulin  (HCC) -     Urine Albumin/Creatinine with ratio (send out) [LAB689] -     metFORMIN  (GLUCOPHAGE ) 1000 MG tablet; Take 1 tablet (1,000 mg total) by mouth 2 (two) times daily with a meal. -     Semaglutide , 1 MG/DOSE, 4 MG/3ML SOPN; Inject 1 mg as directed once a week. -     POCT A1C -     POCT glucose (manual entry) Continue blood sugar control as discussed in office today, low carbohydrate diet, and regular physical exercise as tolerated, 150 minutes per week (30 min each day, 5 days per week, or 50 min 3 days per week). Keep blood sugar logs with fasting goal of 90-130 mg/dl, post prandial (after you eat) less than 180.  For Hypoglycemia: BS <60 and Hyperglycemia BS >400; contact the clinic ASAP. Annual eye exams and foot exams are recommended.   Primary hypertension -     carvedilol  (COREG ) 25 MG tablet; Take 1 tablet (25 mg total) by mouth 2 (two) times daily with a meal. -     amLODipine  (NORVASC ) 5 MG tablet; Take 1 tablet (5 mg total) by mouth daily. -     glipiZIDE  (GLUCOTROL  XL) 10 MG 24 hr tablet; Take 1 tablet (10 mg total) by mouth daily with breakfast. -     hydrALAZINE  (APRESOLINE ) 25 MG tablet; Take 1 tablet (25 mg total) by mouth 2 (two) times daily at 10 AM and 5 PM. -     sacubitril -valsartan  (ENTRESTO ) 97-103 MG; Take 1 tablet by mouth 2 (two) times daily. -     spironolactone  (ALDACTONE ) 25 MG tablet; Take 1 tablet (25 mg total) by mouth daily. Continue all antihypertensives as prescribed.  Reminded to bring in blood pressure log for follow  up appointment.  RECOMMENDATIONS: DASH/Mediterranean Diets are healthier choices for HTN.    Pruritus due to systemic disorder -     hydrOXYzine  (ATARAX ) 10 MG tablet; Take 1 tablet (10 mg total) by mouth 3 (three) times daily as needed for  itching.  Anxiety and depression -     risperiDONE  (RISPERDAL ) 2 MG tablet; Take 1 tablet (2 mg total) by mouth at bedtime.  Mixed incontinence urge and stress -     For home use only DME Other see comment  She leaks urine with coughing, standing, walking, laughing, sneezing, with urge, with a full bladder, during the night. Patient describes the symptoms as  urge to urinate with little or no warning, urine leakage with coughing/heavy physical activity, and urine leaking unpredictably.  Factors associated with symptoms include associated with menopause, medications. .    Rhinorrhea -     levocetirizine (XYZAL ) 5 MG tablet; Take 1 tablet (5 mg total) by mouth every evening.    Patient has been counseled on age-appropriate routine health concerns for screening and prevention. These are reviewed and up-to-date. Referrals have been placed accordingly. Immunizations are up-to-date or declined.    Subjective:   Chief Complaint  Patient presents with   Fatigue    [name] [age] y.o. female presents to office today for follow up to HTN  She Declines mammogram   PMH significant for:  AonC diastolic congestive heart failure (11/02/2013), Benign essential HTN (11/28/2013), Bipolar disease, chronic, Cor pulmonale, History of thyrotoxicosis, DM2,  Mediastinal adenopathy,  Morbid obesity with  BMI >50, OSA (obstructive sleep apnea) DOES NOT USE BIPAP,   Persistent Afib on xarelto  (12/09/2017), Pulmonary HTN, moderate to severe (11/03/2013),  SVT (12/06/2013)   FOLLOWED BY CARDIOLOGY   HTN Blood pressure is well controlled.States she does not feel good today. She is very depressed. Mother's health is failing and she is now on HD.  She endorses adherence with taking her medications as follows: carvedilol  25 mg BID, taking hydralazine  25 mg BID and not TID, entresto  97-103 mg BID, spironolactone  25 mg daily, amlodipine  5 mg daily and torsemide  60 mg daily. She does not use her CPAP. Endorses chronic  fatigue.  BP Readings from Last 3 Encounters:  01/26/24 122/84  11/29/23 100/80  10/29/23 (!) 127/94      DM 2 A1c at goal.  Lab Results  Component Value Date   HGBA1C 6.2 01/26/2024    Review of Systems  Constitutional:  Positive for malaise/fatigue. Negative for fever and weight loss.  HENT: Negative.  Negative for nosebleeds.   Eyes: Negative.  Negative for blurred vision, double vision and photophobia.  Respiratory: Negative.  Negative for cough and shortness of breath.   Cardiovascular: Negative.  Negative for chest pain, palpitations and leg swelling.  Gastrointestinal: Negative.  Negative for heartburn, nausea and vomiting.  Genitourinary:        Mixed urge and stress incontinence.   Musculoskeletal: Negative.  Negative for myalgias.  Neurological: Negative.  Negative for dizziness, focal weakness, seizures and headaches.  Endo/Heme/Allergies:  Positive for environmental allergies.  Psychiatric/Behavioral: Negative.  Negative for suicidal ideas.     Past Medical History:  Diagnosis Date   Acute on chronic diastolic congestive heart failure (HCC) 11/02/2013   10/03/2015, 11/13/2015, 08/03/2017   Atrial fibrillation (HCC)    Benign essential HTN 11/28/2013   Bipolar disease, chronic (HCC)    Chest pain    a. 2012 Myoview : EF 63%, no isch/infarct;  b. 04/2016 Lexiscan  MV: EF 73%, no ischemia/infarct-->Low risk.   Chronic diastolic CHF (congestive heart failure) (HCC) 07/23/2011   a. 2015 Echo: EF 55-60%, Gr2 DD;  b. 09/2015 Echo: EF 60-65%, no rwma, mod dil LA, PASP .   Cor pulmonale (chronic) (HCC)    History of thyrotoxicosis    HTN (hypertension) 11/28/2013   Hypertensive heart disease 10/18/2013   Hypoglycemia    Insulin  dependent type 2 diabetes mellitus, uncontrolled    Mediastinal adenopathy    Morbid obesity due to excess calories (HCC) 02/19/2011   Morbid obesity with BMI of 50.0-59.9, adult (HCC)    OSA (obstructive sleep apnea) 03/06/2011    Persistent atrial fibrillation (HCC) 12/09/2017   Pulmonary HTN, moderate to severe 11/03/2013   Sinusitis, chronic 01/02/2015   SVT (supraventricular tachycardia) (HCC) 12/06/2013   Uncontrolled type 2 diabetes mellitus with hyperglycemia [facility])     Past Surgical History:  Procedure Laterality Date   CARDIOVERSION N/A 04/05/2018   Procedure: CARDIOVERSION;  Surgeon: Lenise Quince, MD;  Location: [facility] ENDOSCOPY;  Service: Cardiovascular;  Laterality: N/A;   COLONOSCOPY WITH PROPOFOL  Left 07/16/2018   Procedure: COLONOSCOPY WITH PROPOFOL ;  Surgeon: Genell Ken, MD;  Location: [facility] ENDOSCOPY;  Service: Gastroenterology;  Laterality: Left;   LEFT HEART CATH AND CORONARY ANGIOGRAPHY N/A 08/04/2018   Procedure: LEFT HEART CATH AND CORONARY ANGIOGRAPHY;  Surgeon: Arty Binning, MD;  Location: [facility] INVASIVE CV LAB;  Service: Cardiovascular;  Laterality: N/A;   None     POLYPECTOMY  07/16/2018   Procedure: POLYPECTOMY;  Surgeon: Genell Ken, MD;  Location:  [facility] ENDOSCOPY;  Service: Gastroenterology;;    Family History  Problem Relation Age of Onset   Heart failure Father    Stroke Father    Hypertension Mother    Heart disease Maternal Grandfather     Social History Reviewed with no changes to be made today.   Outpatient Medications Prior to Visit  Medication Sig Dispense Refill   Accu-Chek Softclix Lancets lancets Use 2 (two) times daily. 100 each 1   Blood Glucose Monitoring Suppl (ACCU-CHEK GUIDE) w/Device KIT use to check blood sugar 1 kit 0   glucose blood (TRUE METRIX BLOOD GLUCOSE TEST) test strip Use as instructed (Patient taking differently: as needed. Use as instructed) 100 each 12   Incontinence Supply Disposable (COMFORT SHIELD ADULT DIAPERS) MISC Please provide with sixe 2-3xl adult diapers. N39.3 48 each 12   Incontinence Supply Disposable (INCONTINENCE BRIEF LARGE) MISC Please provide patient with insurance approved incontinence supplies/briefs 18 each 6   rivaroxaban  (XARELTO )  20 MG TABS tablet Take 1 tablet (20 mg total) by mouth daily with supper. 30 tablet 5   torsemide  (DEMADEX ) 20 MG tablet Take 4 tablets (80 mg total) by mouth daily. 120 tablet 5   amLODipine  (NORVASC ) 5 MG tablet Take 1 tablet (5 mg total) by mouth daily. 90 tablet 3   carvedilol  (COREG ) 25 MG tablet Take 1 tablet (25 mg total) by mouth 2 (two) times daily with a meal. 180 tablet 1   glipiZIDE  (GLUCOTROL  XL) 10 MG 24 hr tablet Take 1 tablet (10 mg total) by mouth daily with breakfast. 90 tablet 1   hydrALAZINE  (APRESOLINE ) 25 MG tablet Take 1 tablet (25 mg total) by mouth 2 (two) times daily at 10 AM and 5 PM. 180 tablet 1   hydrOXYzine  (ATARAX ) 10 MG tablet Take 1 tablet (10 mg total) by mouth 3 (three) times daily as needed for itching. 30 tablet 0   metFORMIN  (GLUCOPHAGE ) 1000 MG tablet Take 1 tablet (1,000 mg total) by mouth 2 (two) times daily with a meal. 180 tablet 1   risperiDONE  (RISPERDAL ) 2 MG tablet Take 1 tablet (2 mg total) by mouth at bedtime. 30 tablet 0   sacubitril -valsartan  (ENTRESTO ) 97-103 MG Take 1 tablet by mouth 2 (two) times daily. 60 tablet 10   Semaglutide , 1 MG/DOSE, 4 MG/3ML SOPN Inject 1 mg as directed once a week. 3 mL 3   spironolactone  (ALDACTONE ) 25 MG tablet Take 1 tablet (25 mg total) by mouth daily. 90 tablet 0   No facility-administered medications prior to visit.    Allergies  Allergen Reactions   Acetaminophen  Other (See Comments)    Seizure-like "fits" as a child   Caffeine     Tense, anxiety, increased urination   Farxiga  [Dapagliflozin ] Other (See Comments)    Hallucinations, drop in blood sugar   Pork-Derived Products Other (See Comments)    Religious reasons. Patient verified on 1/26 okay to get heparin  products if necessary   Lisinopril  Rash    Rash with lisinopril ; but fosinopril  is ok per patient       Objective:    BP 122/84 (BP Location: Left Arm, Patient Position: Sitting, Cuff Size: Large)   Pulse 94   Resp 19   Ht 5\' 7"  (1.702  m)   Wt (!) 342 lb (155.1 kg)   SpO2 98%   BMI 53.56 kg/m  Wt Readings from Last 3 Encounters:  01/26/24 (!) 342 lb (155.1 kg)  11/29/23 (!) 348 lb 9.6 oz (158.1 kg)  10/29/23 (!) 349 lb 6.4 oz (158.5 kg)    Physical Exam Vitals and nursing note reviewed.  Constitutional:      Appearance: She is well-developed.  HENT:     Head: Normocephalic and atraumatic.  Cardiovascular:     Rate and Rhythm: Normal rate and regular rhythm.     Heart sounds: Normal heart sounds. No murmur heard.    No friction rub. No gallop.  Pulmonary:     Effort: Pulmonary effort is normal. No tachypnea or respiratory distress.     Breath sounds: Normal breath sounds. No decreased breath sounds, wheezing, rhonchi or rales.  Chest:     Chest wall: No tenderness.  Abdominal:     General: Bowel sounds are normal.     Palpations: Abdomen is soft.  Musculoskeletal:        General: Normal range of motion.     Cervical back: Normal range of motion.  Skin:    General: Skin is warm and dry.  Neurological:     Mental Status: She is alert and oriented to person, place, and time.     Coordination: Coordination normal.  Psychiatric:        Behavior: Behavior normal. Behavior is cooperative.        Thought Content: Thought content normal.        Judgment: Judgment normal.          Patient has been counseled extensively about nutrition and exercise as well as the importance of adherence with medications and regular follow-up. The patient was given clear instructions to go to ER or return to medical center if symptoms don't improve, worsen or new problems develop. The patient verbalized understanding.   Follow-up: Return in about 3 months (around 04/26/2024).   Collins Dean, FNP-BC [facility] [place], [place] [phone]   01/27/2024, 11:22 PM

## 2024-01-27 ENCOUNTER — Encounter: Payer: Self-pay | Admitting: Nurse Practitioner

## 2024-01-28 LAB — MICROALBUMIN / CREATININE URINE RATIO
Creatinine, Urine: 36.5 mg/dL
Microalb/Creat Ratio: 427 mg/g{creat} — ABNORMAL HIGH (ref 0–29)
Microalbumin, Urine: 155.9 ug/mL

## 2024-02-07 ENCOUNTER — Telehealth: Payer: Self-pay | Admitting: Nurse Practitioner

## 2024-02-07 ENCOUNTER — Other Ambulatory Visit: Payer: Self-pay

## 2024-02-07 NOTE — Telephone Encounter (Signed)
 Copied from CRM [id number]. Topic: General - Other >> Feb 07, 2024 10:43 AM Star East wrote:  Reason for CRM: [name] with [employer] sending fax for incontinence supplies

## 2024-02-07 NOTE — Telephone Encounter (Signed)
 Noted.

## 2024-02-09 ENCOUNTER — Other Ambulatory Visit: Payer: Self-pay

## 2024-02-10 ENCOUNTER — Other Ambulatory Visit: Payer: Self-pay

## 2024-02-14 NOTE — Telephone Encounter (Signed)
 Copied from CRM [id number]. Topic: General - Other >> Feb 14, 2024  2:50 PM Santiya F wrote:  Reason for CRM: Camilo Cella with [employer] is calling in to check the status of a fax that was resent on 02/07/24. [phone]

## 2024-02-15 NOTE — Telephone Encounter (Signed)
 Order has been faxed

## 2024-03-14 ENCOUNTER — Other Ambulatory Visit: Payer: Self-pay

## 2024-03-15 ENCOUNTER — Other Ambulatory Visit: Payer: Self-pay

## 2024-03-16 ENCOUNTER — Other Ambulatory Visit: Payer: Self-pay

## 2024-03-28 ENCOUNTER — Telehealth (HOSPITAL_COMMUNITY): Payer: Self-pay | Admitting: Cardiology

## 2024-04-10 ENCOUNTER — Other Ambulatory Visit: Payer: Self-pay

## 2024-04-10 MED ORDER — SOLIFENACIN SUCCINATE 10 MG PO TABS
10.0000 mg | ORAL_TABLET | Freq: Every day | ORAL | 11 refills | Status: AC
  Filled 2024-04-10: qty 30, 30d supply, fill #0
  Filled 2024-06-06: qty 30, 30d supply, fill #1
  Filled 2024-07-16: qty 30, 30d supply, fill #2
  Filled 2024-08-14: qty 30, 30d supply, fill #3
  Filled 2024-09-24: qty 30, 30d supply, fill #4
  Filled 2024-10-31: qty 30, 30d supply, fill #5

## 2024-04-13 ENCOUNTER — Other Ambulatory Visit: Payer: Self-pay

## 2024-04-13 MED ORDER — CEPHALEXIN 500 MG PO CAPS
500.0000 mg | ORAL_CAPSULE | Freq: Two times a day (BID) | ORAL | 0 refills | Status: DC
  Filled 2024-04-13: qty 14, 7d supply, fill #0

## 2024-04-18 ENCOUNTER — Other Ambulatory Visit: Payer: Self-pay | Admitting: Cardiology

## 2024-04-18 ENCOUNTER — Other Ambulatory Visit: Payer: Self-pay

## 2024-04-18 DIAGNOSIS — I4819 Other persistent atrial fibrillation: Secondary | ICD-10-CM

## 2024-04-18 MED ORDER — RIVAROXABAN 20 MG PO TABS
20.0000 mg | ORAL_TABLET | Freq: Every day | ORAL | 5 refills | Status: AC
  Filled 2024-04-18: qty 30, 30d supply, fill #0
  Filled 2024-06-06: qty 30, 30d supply, fill #1
  Filled 2024-07-16: qty 30, 30d supply, fill #2
  Filled 2024-08-14: qty 30, 30d supply, fill #3
  Filled 2024-09-24: qty 30, 30d supply, fill #4
  Filled 2024-10-31: qty 30, 30d supply, fill #5

## 2024-04-18 NOTE — Telephone Encounter (Signed)
 Prescription refill request for Xarelto  received.  Indication:AFIB Last office visit:3/25 Weight:155.1 kg Age:58 Scr:1.14  3/25 CrCl:134.92  ml/min  Prescription refilled

## 2024-04-25 ENCOUNTER — Other Ambulatory Visit: Payer: Self-pay

## 2024-04-26 ENCOUNTER — Telehealth: Payer: Self-pay | Admitting: Nurse Practitioner

## 2024-04-26 ENCOUNTER — Encounter: Payer: Self-pay | Admitting: Nurse Practitioner

## 2024-04-26 ENCOUNTER — Ambulatory Visit: Attending: Nurse Practitioner | Admitting: Nurse Practitioner

## 2024-04-26 ENCOUNTER — Other Ambulatory Visit: Payer: Self-pay

## 2024-04-26 VITALS — BP 115/75 | HR 100 | Resp 19 | Ht 67.0 in | Wt 348.4 lb

## 2024-04-26 DIAGNOSIS — N393 Stress incontinence (female) (male): Secondary | ICD-10-CM | POA: Diagnosis not present

## 2024-04-26 DIAGNOSIS — Z1231 Encounter for screening mammogram for malignant neoplasm of breast: Secondary | ICD-10-CM | POA: Diagnosis not present

## 2024-04-26 DIAGNOSIS — D649 Anemia, unspecified: Secondary | ICD-10-CM | POA: Diagnosis not present

## 2024-04-26 DIAGNOSIS — I10 Essential (primary) hypertension: Secondary | ICD-10-CM | POA: Diagnosis not present

## 2024-04-26 NOTE — Telephone Encounter (Signed)
 Called patient, no answer. Left voicemail confirming upcoming appointment on 04/26/2024 at 2:30 pm. Provided callback number for any questions or changes.

## 2024-04-26 NOTE — Progress Notes (Signed)
 Assessment & Plan:  [name] was seen today for hypertension.  Diagnoses and all orders for this visit:  Primary hypertension -     CMP14+EGFR Continue all antihypertensives as prescribed.  Reminded to bring in blood pressure log for follow  up appointment.  RECOMMENDATIONS: DASH/Mediterranean Diets are healthier choices for HTN.    Breast cancer screening by mammogram -     MS 3D SCR MAMMO BILAT BR (aka MM); Future  Anemia, unspecified type -     CBC with Differential  Stress incontinence in female -     Misc. Devices MISC; Please provide patient with adult incontinence underwear or pullups. Size 2-3x.  N39.3 -     For home use only DME Other see comment    Patient has been counseled on age-appropriate routine health concerns for screening and prevention. These are reviewed and up-to-date. Referrals have been placed accordingly. Immunizations are up-to-date or declined.    Subjective:   Chief Complaint  Patient presents with   Hypertension    [name] [age] y.o. female presents to office today for follow up to HTN and AUB   She Declines mammogram and PAP smear   PMH significant for:  AonC diastolic congestive heart failure (11/02/2013), Benign essential HTN (11/28/2013), Bipolar disease, chronic, Cor pulmonale, History of thyrotoxicosis, DM2,  Mediastinal adenopathy,  Morbid obesity with BMI >50, OSA (obstructive sleep apnea) DOES NOT USE BIPAP,   Persistent Afib on xarelto  (12/09/2017), Pulmonary HTN, moderate to severe (11/03/2013),  SVT (12/06/2013)    FOLLOWED BY CARDIOLOGY    HTN Blood pressure is well controlled.  BP Readings from Last 3 Encounters:  04/26/24 115/75  01/26/24 122/84  11/29/23 100/80     She had a urinary tract infection for which she was previously prescribed cephalexin . She feels better now with no current symptoms related to the urinary tract infection.  She experienced a menstrual period that lasted for ten days after not having one for six  months. She initially thought she was over menopause but was surprised by the return of her period. The bleeding has stopped now.  She is currently experiencing issues with urinary incontinence and has been using incontinence pads. However, the pads provided were not suitable, described as 'like a diaper' rather than the pull-on briefs she expected as well as being ill fitted. She has an upcoming appointment with a urologist scheduled after August 28th. She is requesting adult pullups instead today.  .  Review of Systems  Constitutional:  Negative for fever, malaise/fatigue and weight loss.  HENT: Negative.  Negative for nosebleeds.   Eyes: Negative.  Negative for blurred vision, double vision and photophobia.  Respiratory: Negative.  Negative for cough and shortness of breath.   Cardiovascular: Negative.  Negative for chest pain, palpitations and leg swelling.  Gastrointestinal: Negative.  Negative for heartburn, nausea and vomiting.  Genitourinary:        Stress incontinence  Musculoskeletal: Negative.  Negative for myalgias.  Neurological: Negative.  Negative for dizziness, focal weakness, seizures and headaches.  Psychiatric/Behavioral: Negative.  Negative for suicidal ideas.     Past Medical History:  Diagnosis Date   Acute on chronic diastolic congestive heart failure (HCC) 11/02/2013   10/03/2015, 11/13/2015, 08/03/2017   Atrial fibrillation (HCC)    Benign essential HTN 11/28/2013   Bipolar disease, chronic (HCC)    Chest pain    a. 2012 Myoview : EF 63%, no isch/infarct;  b. 04/2016 Lexiscan  MV: EF 73%, no ischemia/infarct-->Low risk.   Chronic  diastolic CHF (congestive heart failure) (HCC) 07/23/2011   a. 2015 Echo: EF 55-60%, Gr2 DD;  b. 09/2015 Echo: EF 60-65%, no rwma, mod dil LA, PASP .   Cor pulmonale (chronic) (HCC)    History of thyrotoxicosis    HTN (hypertension) 11/28/2013   Hypertensive heart disease 10/18/2013   Hypoglycemia    Insulin  dependent type 2  diabetes mellitus, uncontrolled    Mediastinal adenopathy    Morbid obesity due to excess calories (HCC) 02/19/2011   Morbid obesity with BMI of 50.0-59.9, adult (HCC)    OSA (obstructive sleep apnea) 03/06/2011   Persistent atrial fibrillation (HCC) 12/09/2017   Pulmonary HTN, moderate to severe 11/03/2013   Sinusitis, chronic 01/02/2015   SVT (supraventricular tachycardia) (HCC) 12/06/2013   Uncontrolled type 2 diabetes mellitus with hyperglycemia [facility])     Past Surgical History:  Procedure Laterality Date   CARDIOVERSION N/A 04/05/2018   Procedure: CARDIOVERSION;  Surgeon: Pietro Redell RAMAN, MD;  Location: [facility] ENDOSCOPY;  Service: Cardiovascular;  Laterality: N/A;   COLONOSCOPY WITH PROPOFOL  Left 07/16/2018   Procedure: COLONOSCOPY WITH PROPOFOL ;  Surgeon: Saintclair Jasper, MD;  Location: [facility] ENDOSCOPY;  Service: Gastroenterology;  Laterality: Left;   LEFT HEART CATH AND CORONARY ANGIOGRAPHY N/A 08/04/2018   Procedure: LEFT HEART CATH AND CORONARY ANGIOGRAPHY;  Surgeon: Claudene Victory ORN, MD;  Location: [facility] INVASIVE CV LAB;  Service: Cardiovascular;  Laterality: N/A;   None     POLYPECTOMY  07/16/2018   Procedure: POLYPECTOMY;  Surgeon: Saintclair Jasper, MD;  Location: [facility] ENDOSCOPY;  Service: Gastroenterology;;    Family History  Problem Relation Age of Onset   Heart failure Father    Stroke Father    Hypertension Mother    Heart disease Maternal Grandfather     Social History Reviewed with no changes to be made today.   Outpatient Medications Prior to Visit  Medication Sig Dispense Refill   amLODipine  (NORVASC ) 5 MG tablet Take 1 tablet (5 mg total) by mouth daily. 90 tablet 3   carvedilol  (COREG ) 25 MG tablet Take 1 tablet (25 mg total) by mouth 2 (two) times daily with a meal. 180 tablet 1   glipiZIDE  (GLUCOTROL  XL) 10 MG 24 hr tablet Take 1 tablet (10 mg total) by mouth daily with breakfast. 90 tablet 1   glucose blood (TRUE METRIX BLOOD GLUCOSE TEST) test strip Use as instructed (Patient  taking differently: as needed. Use as instructed) 100 each 12   hydrALAZINE  (APRESOLINE ) 25 MG tablet Take 1 tablet (25 mg total) by mouth 2 (two) times daily at 10 AM and 5 PM. 180 tablet 1   hydrOXYzine  (ATARAX ) 10 MG tablet Take 1 tablet (10 mg total) by mouth 3 (three) times daily as needed for itching. 60 tablet 1   levocetirizine (XYZAL ) 5 MG tablet Take 1 tablet (5 mg total) by mouth every evening. 30 tablet 6   metFORMIN  (GLUCOPHAGE ) 1000 MG tablet Take 1 tablet (1,000 mg total) by mouth 2 (two) times daily with a meal. 180 tablet 1   risperiDONE  (RISPERDAL ) 2 MG tablet Take 1 tablet (2 mg total) by mouth at bedtime. 90 tablet 1   rivaroxaban  (XARELTO ) 20 MG TABS tablet Take 1 tablet (20 mg total) by mouth daily with supper. 30 tablet 5   sacubitril -valsartan  (ENTRESTO ) 97-103 MG Take 1 tablet by mouth 2 (two) times daily. 60 tablet 10   Semaglutide , 1 MG/DOSE, 4 MG/3ML SOPN Inject 1 mg as directed once a week. 3 mL 3   solifenacin  (VESICARE )  10 MG tablet Take 1 tablet (10 mg total) by mouth daily. 30 tablet 11   spironolactone  (ALDACTONE ) 25 MG tablet Take 1 tablet (25 mg total) by mouth daily. 90 tablet 1   torsemide  (DEMADEX ) 20 MG tablet Take 4 tablets (80 mg total) by mouth daily. 120 tablet 5   cephALEXin  (KEFLEX ) 500 MG capsule Take 1 capsule (500 mg total) by mouth 2 (two) times daily. 14 capsule 0   Incontinence Supply Disposable (COMFORT SHIELD ADULT DIAPERS) MISC Please provide with sixe 2-3xl adult diapers. N39.3 48 each 12   Incontinence Supply Disposable (INCONTINENCE BRIEF LARGE) MISC Please provide patient with insurance approved incontinence supplies/briefs 18 each 6   Accu-Chek Softclix Lancets lancets Use 2 (two) times daily. (Patient not taking: Reported on 04/26/2024) 100 each 1   Blood Glucose Monitoring Suppl (ACCU-CHEK GUIDE) w/Device KIT use to check blood sugar (Patient not taking: Reported on 04/26/2024) 1 kit 0   No facility-administered medications prior to visit.     Allergies  Allergen Reactions   Acetaminophen  Other (See Comments)    Seizure-like fits as a child   Caffeine     Tense, anxiety, increased urination   Farxiga  [Dapagliflozin ] Other (See Comments)    Hallucinations, drop in blood sugar   Pork-Derived Products Other (See Comments)    Religious reasons. Patient verified on 1/26 okay to get heparin  products if necessary   Lisinopril  Rash    Rash with lisinopril ; but fosinopril  is ok per patient       Objective:    BP 115/75 (BP Location: Left Arm, Patient Position: Sitting, Cuff Size: Normal)   Pulse 100   Resp 19   Ht 5' 7 (1.702 m)   Wt (!) 348 lb 6.4 oz (158 kg)   LMP 04/15/2024 Comment: Heavy clots  SpO2 100%   BMI 54.57 kg/m  Wt Readings from Last 3 Encounters:  04/26/24 (!) 348 lb 6.4 oz (158 kg)  01/26/24 (!) 342 lb (155.1 kg)  11/29/23 (!) 348 lb 9.6 oz (158.1 kg)    Physical Exam Vitals and nursing note reviewed.  Constitutional:      Appearance: She is well-developed.  HENT:     Head: Normocephalic and atraumatic.  Cardiovascular:     Rate and Rhythm: Regular rhythm. Tachycardia present.     Heart sounds: Normal heart sounds. No murmur heard.    No friction rub. No gallop.  Pulmonary:     Effort: Pulmonary effort is normal. No tachypnea or respiratory distress.     Breath sounds: Normal breath sounds. No decreased breath sounds, wheezing, rhonchi or rales.  Chest:     Chest wall: No tenderness.  Abdominal:     General: Bowel sounds are normal.     Palpations: Abdomen is soft.  Musculoskeletal:        General: Normal range of motion.     Cervical back: Normal range of motion.  Skin:    General: Skin is warm and dry.  Neurological:     Mental Status: She is alert and oriented to person, place, and time.     Coordination: Coordination normal.  Psychiatric:        Behavior: Behavior normal. Behavior is cooperative.        Thought Content: Thought content normal.        Judgment: Judgment  normal.          Patient has been counseled extensively about nutrition and exercise as well as the importance of adherence with medications  and regular follow-up. The patient was given clear instructions to go to ER or return to medical center if symptoms don't improve, worsen or new problems develop. The patient verbalized understanding.   Follow-up: Return in about 3 months (around 07/27/2024).   Haze LELON Servant, FNP-BC [facility] and [facility] [place], [place] [phone]   04/30/2024, 10:15 PM

## 2024-04-26 NOTE — Patient Instructions (Signed)
 [facility] Located in: [facility] Address: [address], [place], [place] [address] Phone: [phone]

## 2024-04-27 LAB — CBC WITH DIFFERENTIAL/PLATELET
Basophils Absolute: 0 x10E3/uL (ref 0.0–0.2)
Basos: 1 %
EOS (ABSOLUTE): 0.1 x10E3/uL (ref 0.0–0.4)
Eos: 1 %
Hematocrit: 31 % — ABNORMAL LOW (ref 34.0–46.6)
Hemoglobin: 9.5 g/dL — ABNORMAL LOW (ref 11.1–15.9)
Immature Grans (Abs): 0 x10E3/uL (ref 0.0–0.1)
Immature Granulocytes: 0 %
Lymphocytes Absolute: 1.4 x10E3/uL (ref 0.7–3.1)
Lymphs: 21 %
MCH: 27.5 pg (ref 26.6–33.0)
MCHC: 30.6 g/dL — ABNORMAL LOW (ref 31.5–35.7)
MCV: 90 fL (ref 79–97)
Monocytes Absolute: 0.6 x10E3/uL (ref 0.1–0.9)
Monocytes: 10 %
Neutrophils Absolute: 4.2 x10E3/uL (ref 1.4–7.0)
Neutrophils: 66 %
Platelets: 287 x10E3/uL (ref 150–450)
RBC: 3.45 x10E6/uL — ABNORMAL LOW (ref 3.77–5.28)
RDW: 14.5 % (ref 11.7–15.4)
WBC: 6.3 x10E3/uL (ref 3.4–10.8)

## 2024-04-27 LAB — CMP14+EGFR
ALT: 9 IU/L (ref 0–32)
AST: 17 IU/L (ref 0–40)
Albumin: 4 g/dL (ref 3.8–4.9)
Alkaline Phosphatase: 109 IU/L (ref 44–121)
BUN/Creatinine Ratio: 21 (ref 9–23)
BUN: 29 mg/dL — ABNORMAL HIGH (ref 6–24)
Bilirubin Total: <0.2 mg/dL (ref 0.0–1.2)
CO2: 20 mmol/L (ref 20–29)
Calcium: 9.3 mg/dL (ref 8.7–10.2)
Chloride: 103 mmol/L (ref 96–106)
Creatinine, Ser: 1.39 mg/dL — ABNORMAL HIGH (ref 0.57–1.00)
Globulin, Total: 3.3 g/dL (ref 1.5–4.5)
Glucose: 160 mg/dL — ABNORMAL HIGH (ref 70–99)
Potassium: 4.5 mmol/L (ref 3.5–5.2)
Sodium: 142 mmol/L (ref 134–144)
Total Protein: 7.3 g/dL (ref 6.0–8.5)
eGFR: 45 mL/min/1.73 — ABNORMAL LOW (ref >59)

## 2024-04-30 ENCOUNTER — Encounter: Payer: Self-pay | Admitting: Nurse Practitioner

## 2024-04-30 ENCOUNTER — Ambulatory Visit: Payer: Self-pay | Admitting: Nurse Practitioner

## 2024-04-30 MED ORDER — MISC. DEVICES MISC
0 refills | Status: AC
  Filled 2024-04-30: qty 1, fill #0

## 2024-04-30 MED ORDER — MISC. DEVICES MISC
0 refills | Status: DC
  Filled 2024-04-30: qty 1, fill #0

## 2024-05-01 ENCOUNTER — Other Ambulatory Visit: Payer: Self-pay

## 2024-05-12 ENCOUNTER — Ambulatory Visit: Payer: Self-pay

## 2024-05-12 ENCOUNTER — Telehealth: Payer: Self-pay

## 2024-05-12 ENCOUNTER — Telehealth (HOSPITAL_COMMUNITY): Payer: Self-pay | Admitting: Cardiology

## 2024-05-12 NOTE — Telephone Encounter (Signed)
 Called to confirm/remind patient of their appointment at the [facility] on 05/12/24.   Appointment:   [x] Confirmed  [] Left mess   [] No answer/No voice mail  [] VM Full/unable to leave message  [] Phone not in service  Patient reminded to bring all medications and/or complete list.  Confirmed patient has transportation. Gave directions, instructed to utilize valet parking.

## 2024-05-12 NOTE — Telephone Encounter (Signed)
 1st attempt, no answer. Left voicemail for patient to return call for nurse triage.   Message from Kevelyn M sent at 05/12/2024  3:45 PM EDT  Patient called in for lab results. Nurses in office are not available. Call back #[phone]

## 2024-05-12 NOTE — Telephone Encounter (Signed)
 FYI Only or Action Required?: Action required by provider: lab or test result follow-up needed.  Patient was last seen in primary care on 04/26/2024 by Theotis Haze ORN, NP.  Called Nurse Triage reporting Advice Only.  Symptoms began today.  Interventions attempted: Nothing.  Symptoms are: stable.  Triage Disposition: Call PCP When Office is Open  Patient/caregiver understands and will follow disposition?: Yes   Reason for Disposition  Caller requesting routine or non-urgent lab result  Answer Assessment - Initial Assessment Questions 1. REASON FOR CALL or QUESTION: What is your reason for calling today? or How can I best     Lab results- pt given lab results per PCP and scheduled for repeat for 05/18/24.  No other questions or concerns.  2. CALLER: Document the source of call. (e.g., laboratory staff, caregiver or patient).     pt  Protocols used: PCP Call - No Triage-A-AH

## 2024-05-12 NOTE — Telephone Encounter (Signed)
 Copied from CRM #[id number]. Topic: Clinical - Lab/Test Results >> May 12, 2024  3:37 PM Larissa S wrote: Reason for CRM: Patient returning calling regarding lab results. Relayed information to patient per provider's note.

## 2024-05-15 ENCOUNTER — Ambulatory Visit (HOSPITAL_BASED_OUTPATIENT_CLINIC_OR_DEPARTMENT_OTHER)
Admission: RE | Admit: 2024-05-15 | Discharge: 2024-05-15 | Disposition: A | Discharge status: Home / Self Care | Source: Ambulatory Visit | Attending: Cardiology | Admitting: Cardiology

## 2024-05-15 ENCOUNTER — Ambulatory Visit (HOSPITAL_COMMUNITY): Payer: Self-pay | Admitting: Cardiology

## 2024-05-15 ENCOUNTER — Other Ambulatory Visit: Payer: Self-pay

## 2024-05-15 ENCOUNTER — Ambulatory Visit (HOSPITAL_COMMUNITY)
Admission: RE | Admit: 2024-05-15 | Discharge: 2024-05-15 | Disposition: A | Discharge status: Home / Self Care | Source: Ambulatory Visit | Attending: Cardiology | Admitting: Cardiology

## 2024-05-15 ENCOUNTER — Ambulatory Visit (HOSPITAL_COMMUNITY): Payer: Self-pay | Admitting: Family Medicine

## 2024-05-15 VITALS — BP 148/80 | HR 85 | Wt 340.2 lb

## 2024-05-15 DIAGNOSIS — G4733 Obstructive sleep apnea (adult) (pediatric): Secondary | ICD-10-CM | POA: Insufficient documentation

## 2024-05-15 DIAGNOSIS — I429 Cardiomyopathy, unspecified: Secondary | ICD-10-CM | POA: Insufficient documentation

## 2024-05-15 DIAGNOSIS — Z5982 Transportation insecurity: Secondary | ICD-10-CM | POA: Insufficient documentation

## 2024-05-15 DIAGNOSIS — I4821 Permanent atrial fibrillation: Secondary | ICD-10-CM | POA: Diagnosis not present

## 2024-05-15 DIAGNOSIS — I10 Essential (primary) hypertension: Secondary | ICD-10-CM | POA: Diagnosis not present

## 2024-05-15 DIAGNOSIS — Z6841 Body Mass Index (BMI) 40.0 and over, adult: Secondary | ICD-10-CM | POA: Diagnosis not present

## 2024-05-15 DIAGNOSIS — Z7984 Long term (current) use of oral hypoglycemic drugs: Secondary | ICD-10-CM | POA: Diagnosis not present

## 2024-05-15 DIAGNOSIS — Z91199 Patient's noncompliance with other medical treatment and regimen due to unspecified reason: Secondary | ICD-10-CM | POA: Diagnosis not present

## 2024-05-15 DIAGNOSIS — I272 Pulmonary hypertension, unspecified: Secondary | ICD-10-CM | POA: Insufficient documentation

## 2024-05-15 DIAGNOSIS — I445 Left posterior fascicular block: Secondary | ICD-10-CM | POA: Diagnosis not present

## 2024-05-15 DIAGNOSIS — Z8249 Family history of ischemic heart disease and other diseases of the circulatory system: Secondary | ICD-10-CM | POA: Diagnosis not present

## 2024-05-15 DIAGNOSIS — E119 Type 2 diabetes mellitus without complications: Secondary | ICD-10-CM | POA: Insufficient documentation

## 2024-05-15 DIAGNOSIS — Z7901 Long term (current) use of anticoagulants: Secondary | ICD-10-CM | POA: Diagnosis not present

## 2024-05-15 DIAGNOSIS — I5032 Chronic diastolic (congestive) heart failure: Secondary | ICD-10-CM

## 2024-05-15 DIAGNOSIS — Q245 Malformation of coronary vessels: Secondary | ICD-10-CM | POA: Diagnosis not present

## 2024-05-15 DIAGNOSIS — Z7985 Long-term (current) use of injectable non-insulin antidiabetic drugs: Secondary | ICD-10-CM | POA: Insufficient documentation

## 2024-05-15 DIAGNOSIS — Z79899 Other long term (current) drug therapy: Secondary | ICD-10-CM | POA: Insufficient documentation

## 2024-05-15 DIAGNOSIS — I110 Hypertensive heart disease with heart failure: Secondary | ICD-10-CM | POA: Diagnosis present

## 2024-05-15 DIAGNOSIS — E66813 Obesity, class 3: Secondary | ICD-10-CM

## 2024-05-15 LAB — ECHOCARDIOGRAM COMPLETE
Area-P 1/2: 5.46 cm2
S' Lateral: 2.8 cm

## 2024-05-15 LAB — BASIC METABOLIC PANEL WITH GFR
Anion gap: 10 (ref 5–15)
BUN: 23 mg/dL — ABNORMAL HIGH (ref 6–20)
CO2: 22 mmol/L (ref 22–32)
Calcium: 9.5 mg/dL (ref 8.9–10.3)
Chloride: 106 mmol/L (ref 98–111)
Creatinine, Ser: 1.24 mg/dL — ABNORMAL HIGH (ref 0.44–1.00)
GFR, Estimated: 51 mL/min — ABNORMAL LOW (ref >60)
Glucose, Bld: 180 mg/dL — ABNORMAL HIGH (ref 70–99)
Potassium: 4.1 mmol/L (ref 3.5–5.1)
Sodium: 138 mmol/L (ref 135–145)

## 2024-05-15 LAB — LIPID PANEL
Cholesterol: 135 mg/dL (ref 0–200)
HDL: 35 mg/dL — ABNORMAL LOW (ref >40)
LDL Cholesterol: 70 mg/dL (ref 0–99)
Total CHOL/HDL Ratio: 3.9 ratio
Triglycerides: 149 mg/dL (ref <150)
VLDL: 30 mg/dL (ref 0–40)

## 2024-05-15 LAB — BRAIN NATRIURETIC PEPTIDE: B Natriuretic Peptide: 473.3 pg/mL — ABNORMAL HIGH (ref 0.0–100.0)

## 2024-05-15 MED ORDER — AMLODIPINE BESYLATE 10 MG PO TABS
10.0000 mg | ORAL_TABLET | Freq: Every day | ORAL | 6 refills | Status: AC
  Filled 2024-05-15 – 2024-10-31 (×2): qty 30, 30d supply, fill #0

## 2024-05-15 NOTE — Telephone Encounter (Signed)
 Noted

## 2024-05-15 NOTE — Patient Instructions (Signed)
 Medication Changes:  INCREASE Amlodipine  to 10 mg Daily  Lab Work:  Labs done today, your results will be available in MyChart, we will contact you for abnormal readings.  Referrals:  You have been referred to Pharmacy Clinic for weight loss medication, they will call you   You have been referred to Dr Fairy for genetic counseling, her office will call you  Special Instructions // Education:  Do the following things EVERYDAY: Weigh yourself in the morning before breakfast. Write it down and keep it in a log. Take your medicines as prescribed Eat low salt foods--Limit salt (sodium) to 2000 mg per day.  Stay as active as you can everyday Limit all fluids for the day to less than 2 liters   Follow-Up in: 4 months   At the [facility], you and your health needs are our priority. We have a designated team specialized in the treatment of Heart Failure. This Care Team includes your primary Heart Failure Specialized Cardiologist (physician), Advanced Practice Providers (APPs- Physician Assistants and Nurse Practitioners), and Pharmacist who all work together to provide you with the care you need, when you need it.   You may see any of the following providers on your designated Care Team at your next follow up:  Dr. Toribio Fuel Dr. Ezra Shuck Dr. Ria Commander Dr. Odis Brownie Greig Mosses, NP Caffie Shed, [place] [facility] [place], [place] Beckey Coe, NP [place] Lee, NP Tinnie Redman, PharmD   Please be sure to bring in all your medications bottles to every appointment.   Need to Contact Us :  If you have any questions or concerns before your next appointment please send us  a message through [place] or call our office at [phone].    TO LEAVE A MESSAGE FOR THE NURSE SELECT OPTION 2, PLEASE LEAVE A MESSAGE INCLUDING: YOUR NAME DATE OF BIRTH CALL BACK NUMBER REASON FOR CALL**this is important as we prioritize the call backs  YOU WILL  RECEIVE A CALL BACK THE SAME DAY AS LONG AS YOU CALL BEFORE 4:00 PM

## 2024-05-16 NOTE — Progress Notes (Signed)
 Advanced Heart Failure Clinic Note  PCP: Theotis Haze ORN, NP HF Cardiologist: Dr. Rolan  Chief complaint: CHF  HPI: Pt is a 58 y.o.female with a history of chronic diastolic heart failure, persistent atrial fibrillation on Xarelto , hypertension, obesity and OSA, not compliant w/ CPAP.  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  She had VFib with heart cath, treated w/ defibrillation to NSR. She also developed AKI after cath.  Echo 04/18/21 with EF 60-65%, no RWMA, severe LVH, RV normal with moderately elevated PA systolic pressure, Lt atrial size severely dilated and RA mildly dilated.  Mild MR, mod to severe TR.   Of note, brother had sudden cardiac death around age 48 and father and grandmother had CHF.    Admitted 11/10/21 for a/c diastolic CHF w/ fluid overload. Afib well rate controlled. Diuresed w/ IV Lasix , then transitioned to GDMT. Discharge home, weight 328 lbs.  Admitted 05/25/22 with acute on chronic HFpEF. Ran out her medications while traveling out of the country. Diuresed with IV lasix  and transitioned to torsemide  40 mg bid. Echo showed LVEF 60-65%, severe LVH, septal thickenness over 20 mm but difficult to measure, RV normal.  Discharge weight 308 pounds.   cMRI 12/23 showed severe LAE with moderate RAE, severe basal septal hypertrophy 17 mm with LVEF normal 64%, no RWMAs, normal RV size and function RVEF 51%, abnormal delayed enhancement images with mottled LGE in apex and septum and more defined area of moderate LGE in mid inferior wall, normal parametric measures.  Unlikely cardiac amyloidosis, consider sarcoidosis or HCM.  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 She also had a RYR2 gene mutation.   7 day Zio 2/24 showed atrial fibrillation occurred  continuously (100% burden), ranging from 56-133 bpm (avg of 81 bpm). Isolated VEs were rare (<1.0%, 1345), VE Couplets were rare (<1.0%, 90), and VE Triplets were rare (<1.0%).    She was seen by EP, and after discussion, it was decided that she did not meet criteria for ICD.   Echo was done today and reviewed, EF 65-70%, mild LVH, normal RV, IVC normal.   Today she returns for HF follow up. BP high today but has not taken any meds yet.  Says SBP generally runs 130s-140s when she checks.  She denies exertional dyspnea or chest pain.  Says she has slept in a recliner for years.  No lightheadedness or palpitations.  Weight is unchanged, she is frustrated that she has not lost weight with semaglutide .   ECG (personally reviewed): atrial fibrillation, LPFB.   Labs (3/24): Scr 1.10, K 4.3  Labs (4/24): SCr 1.28, K 4.2 Labs (6/24): K 4.5, creatinine 1.4 Labs (8/24): K 4.5, creatinine 1.5 Labs (7/25): K 4.5, creatinine 1.39  PMH: 1. Atrial fibrillation: Permanent since 2019.  2. HTN 3. OSA: Unable to tolerate CPAP.  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   - Cardiolite  (8/17): No ischemia 5. Chronic diastolic CHF: Echo (8/23): EF 39-34%, severe LVH, Septal thickenness over 20mm but difficult to measure, RV normal.   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 EF >65%.  - Chest CT 2019 Prominent mediastinal lymph nodes, likely reactive. Lungs/Pleura: +Calcified granulomas. -  Cardiac MRI (12/23): Severe LAE with moderate RAE, severe basal septal hypertrophy 17 mm with LVEF normal 64%, no RWMAs, normal RV size and function RVEF 51%, abnormal delayed enhancement images with mottled LGE in apex and septum and more defined area of moderate LGE in mid inferior wall, normal parametric measures.   - [employer] gene testing showed pathogenic variant of PKP2, associated w/ arrhythmogenic cardiomyopathy. She also had  a RYR2 gene mutation.  - Echo (8/25): EF 65-70%, mild LVH, normal RV, IVC normal.  6. Bipolar disorder 7. 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 Then was enrolled in a MPH program at [employer]. But ran out of money and is no longer attending school. (Note patient has bipolar disorder).      [place] in [place]  but lived in [place] before [place] back to [place] .       Primary language is Arabic. Lives with mother and brother.   Social Drivers of Corporate investment banker Strain: Low Risk  (05/26/2022)   Overall Financial Resource Strain (CARDIA)    Difficulty of Paying Living Expenses: Not very hard  Food Insecurity: No Food Insecurity (10/23/2022)   Hunger Vital Sign    Worried About Running Out of Food in the Last Year: Never true    Ran Out of Food in the Last Year: Never true  Transportation Needs: Unmet Transportation Needs (10/23/2022)   PRAPARE - Transportation    Lack of Transportation (Medical): Yes    Lack of Transportation (Non-Medical): Yes  Physical Activity: Not on file  Stress: No Stress Concern Present (07/15/2018)   [employer] of Occupational Health - Occupational Stress Questionnaire    Feeling of Stress : Only a little  Social Connections: Not on file  Intimate Partner Violence: Not At Risk (10/23/2022)   Humiliation, Afraid, Rape, and Kick questionnaire    Fear of Current or Ex-Partner:  No    Emotionally Abused: No    Physically Abused: No    Sexually Abused: No   FH:  Brother with sudden cardiac death, father with CVA  ROS: All systems reviewed and negative except as per HPI.   Current Outpatient Medications  Medication Sig Dispense Refill   Accu-Chek Softclix Lancets lancets Use 2 (two) times daily. 100 each 1   Blood Glucose Monitoring Suppl (ACCU-CHEK GUIDE) w/Device KIT use to check blood sugar 1 kit 0   carvedilol  (COREG ) 25 MG tablet Take 1 tablet (25 mg total) by mouth 2 (two) times daily with a meal. 180 tablet 1   glipiZIDE  (GLUCOTROL  XL) 10 MG 24 hr tablet Take 1 tablet (10 mg total) by mouth daily with breakfast. 90 tablet 1   glucose blood (TRUE METRIX BLOOD GLUCOSE TEST) test strip Use as instructed 100 each 12   hydrALAZINE  (APRESOLINE ) 25  MG tablet Take 1 tablet (25 mg total) by mouth 2 (two) times daily at 10 AM and 5 PM. 180 tablet 1   hydrOXYzine  (ATARAX ) 10 MG tablet Take 1 tablet (10 mg total) by mouth 3 (three) times daily as needed for itching. 60 tablet 1   levocetirizine (XYZAL ) 5 MG tablet Take 1 tablet (5 mg total) by mouth every evening. 30 tablet 6   metFORMIN  (GLUCOPHAGE ) 1000 MG tablet Take 1 tablet (1,000 mg total) by mouth 2 (two) times daily with a meal. 180 tablet 1   Misc. Devices MISC Please provide patient with adult incontinence underwear or pullups. Size 2-3x.  N39.3 1 each 0   risperiDONE  (RISPERDAL ) 2 MG tablet Take 1 tablet (2 mg total) by mouth at bedtime. 90 tablet 1   rivaroxaban  (XARELTO ) 20 MG TABS tablet Take 1 tablet (20 mg total) by mouth daily with supper. 30 tablet 5   sacubitril -valsartan  (ENTRESTO ) 97-103 MG Take 1 tablet by mouth 2 (two) times daily. 60 tablet 10   Semaglutide , 1 MG/DOSE, 4 MG/3ML SOPN Inject 1 mg as directed once a week. 3 mL 3   solifenacin  (VESICARE ) 10 MG tablet Take 1 tablet (10 mg total) by mouth daily. 30 tablet 11   spironolactone  (ALDACTONE ) 25 MG tablet Take 1 tablet (25 mg total) by mouth  daily. 90 tablet 1   torsemide  (DEMADEX ) 20 MG tablet Take 4 tablets (80 mg total) by mouth daily. 120 tablet 5   amLODipine  (NORVASC ) 10 MG tablet Take 1 tablet (10 mg total) by mouth daily. 30 tablet 6   No current facility-administered medications for this encounter.   BP (!) 148/80   Pulse 85   Wt (!) 154.3 kg (340 lb 3.2 oz)   LMP 04/15/2024 Comment: Heavy clots  SpO2 97%   BMI 53.28 kg/m   Wt Readings from Last 3 Encounters:  05/15/24 (!) 154.3 kg (340 lb 3.2 oz)  04/26/24 (!) 158 kg (348 lb 6.4 oz)  01/26/24 (!) 155.1 kg (342 lb)   PHYSICAL EXAM: General: NAD, obese.  Neck: No JVD, no thyromegaly or thyroid  nodule.  Lungs: Clear to auscultation bilaterally with normal respiratory effort. CV: Nondisplaced PMI.  Heart regular S1/S2, no S3/S4, no murmur.  No peripheral edema.  No carotid bruit.  Normal pedal pulses.  Abdomen: Soft, nontender, no hepatosplenomegaly, no distention.  Skin: Intact without lesions or rashes.  Neurologic: Alert and oriented x 3.  Psych: Normal affect. Extremities: No clubbing or cyanosis.  HEENT: Normal.   ASSESSMENT & PLAN: 1. Chronic Diastolic CHF:  Echo (8/23) with EF 60-65%, severe LVH, Septal thickenness over 20mm but difficult to measure, RV normal.   Severe LVH may be secondary to HTN, but also was concerned for possible hypertrophic cardiomyopathy.  Father/grandmother with CHF and brother with ?sudden death is concerning for familial cardiomyopathy.  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 cMRI w/ normal sized RV and function, RVEF 51%, severe basal septal hypertrophy 17 mm, LVEF normal 64% no RWMAs, abnormal LGE in con-coronary pattern with mottled uptake in apex and septum and more defined area of moderate uptake in mid inferior wall.  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  Cardiac MRI was not suggestive of cardiac amyloidosis. Zio 2/24 showed 100% afib, no ventricular  arrythmias. Echo today showed EF 65-70%, mild LVH, normal RV, IVC normal.  No syncope or palpiations.  NYHA class II, not volume overloaded by exam.  - She saw Dr. Nancey for ICD consideration given suspicion for LV-predominant arrhythmogenic cardiomyopathy with extensive LGE on cMRI; However, after extensive discussion/consideration she was not thought to be a candidate for ICD.  - Continue torsemide  80 mg daily. BMET/BNP today.  - Continue Entresto  97-103 mg bid      - Continue spironolactone  25 mg daily  - Continue carvedilol  25 mg bid. - She did not tolerate Jardiance  - Referred to Dr. Danford Pac for genetic counseling, still needs to schedule.  Will try to set this up today.  - Previously discussed Cardiomems placement but she refused.   - Compliance has been an issue. Was referred to paramedicine but did not cooperate and discharged from the program  2. Hypertension: BP elevated.  - Increase amlodipine  to 10 mg daily.  3. Atrial fibrillation: Permanent since 2019.  Unlikely to cardiovert and stay in NSR at this point given chronicity and noncompliance with CPAP.  Rate controlled.  - Continue Coreg  25 mg bid  - Continue Xarelto .  4. OSA: Not compliant w/ CPAP.  5. Morbid Obesity: Body mass index is 53.28 kg/m.  Now on semaglutide  but weight has plateaued. - Refer back to pharmacy clinic for Mounjaro.  6. DM II: Per PCP.  7. Anomalous RCA: Off the left cusp, appears to course interarterially between aorta and PA.  Cardiolite  in 2017 showed no ischemia.  With an anomalous right, no attributable symptoms (no chest pain), and normal Cardiolite  in past, would medically manage this.   Follow up in 4 months with APP.   I spent 32 minutes reviewing records, interviewing/examining patient, and managing orders.     Ezra Shuck, 05/16/2024

## 2024-05-18 ENCOUNTER — Other Ambulatory Visit

## 2024-05-19 ENCOUNTER — Other Ambulatory Visit: Payer: Self-pay

## 2024-05-23 ENCOUNTER — Other Ambulatory Visit: Payer: Self-pay | Admitting: Nurse Practitioner

## 2024-05-23 DIAGNOSIS — I4819 Other persistent atrial fibrillation: Secondary | ICD-10-CM

## 2024-05-23 DIAGNOSIS — I10 Essential (primary) hypertension: Secondary | ICD-10-CM

## 2024-05-23 DIAGNOSIS — F32A Depression, unspecified: Secondary | ICD-10-CM

## 2024-05-23 NOTE — Telephone Encounter (Unsigned)
 Copied from CRM [id number]. Topic: Clinical - Medication Refill >> May 23, 2024  4:20 PM Amy B wrote: Medication:  rivaroxaban  (XARELTO ) 20 MG TABS tablet; risperiDONE  (RISPERDAL ) 2 MG tablet; torsemide  (DEMADEX ) 20 MG tablet; spironolactone  (ALDACTONE ) 25 MG tablet; solifenacin  (VESICARE ) 10 MG tablet  Has the patient contacted their pharmacy? No (Agent: If no, request that the patient contact the pharmacy for the refill. If patient does not wish to contact the pharmacy document the reason why and proceed with request.) (Agent: If yes, when and what did the pharmacy advise?)  This is the patient's preferred pharmacy:  [facility] [address], [address] [place] [address] Phone: [phone] Fax: [phone]  Is this the correct pharmacy for this prescription? Yes If no, delete pharmacy and type the correct one.   Has the prescription been filled recently? No  Is the patient out of the medication? Yes  Has the patient been seen for an appointment in the last year OR does the patient have an upcoming appointment? Yes  Can we respond through MyChart? Yes  Agent: Please be advised that Rx refills may take up to 3 business days. We ask that you follow-up with your pharmacy.

## 2024-05-24 ENCOUNTER — Other Ambulatory Visit: Payer: Self-pay

## 2024-05-25 ENCOUNTER — Other Ambulatory Visit: Payer: Self-pay

## 2024-05-25 MED ORDER — RISPERIDONE 2 MG PO TABS
2.0000 mg | ORAL_TABLET | Freq: Every day | ORAL | 1 refills | Status: AC
  Filled 2024-05-25: qty 30, 30d supply, fill #0
  Filled 2024-07-16: qty 30, 30d supply, fill #1
  Filled 2024-08-14: qty 30, 30d supply, fill #2
  Filled 2024-09-24: qty 30, 30d supply, fill #3
  Filled 2024-10-31: qty 30, 30d supply, fill #4

## 2024-05-25 MED ORDER — SPIRONOLACTONE 25 MG PO TABS
25.0000 mg | ORAL_TABLET | Freq: Every day | ORAL | 1 refills | Status: AC
  Filled 2024-05-25: qty 30, 30d supply, fill #0
  Filled 2024-08-14: qty 30, 30d supply, fill #1

## 2024-05-25 NOTE — Telephone Encounter (Signed)
 Requested medications are due for refill today.  Some are, others are not  Requested medications are on the active medications list.  yes  Last refill. Varied,  Future visit scheduled.   yes  Notes to clinic.  Of the 5 medications requested 3 are signed by other providers. Requesting that provider review.    Requested Prescriptions  Pending Prescriptions Disp Refills   rivaroxaban  (XARELTO ) 20 MG TABS tablet 30 tablet 5    Sig: Take 1 tablet (20 mg total) by mouth daily with supper.     Hematology: Anticoagulants - rivaroxaban  Failed - 05/25/2024 12:03 PM      Failed - Cr in normal range and within 360 days    Creat  Date Value Ref Range Status  08/06/2016 0.81 0.50 - 1.10 mg/dL Final   Creatinine, Ser  Date Value Ref Range Status  05/15/2024 1.24 (H) 0.44 - 1.00 mg/dL Final   Creatinine, Urine  Date Value Ref Range Status  08/06/2018 197.25 mg/dL Final    Comment:    Performed at [facility], [address], [place], [place] [address]         Failed - HCT in normal range and within 360 days    Hematocrit  Date Value Ref Range Status  04/26/2024 31.0 (L) 34.0 - 46.6 % Final         Failed - HGB in normal range and within 360 days    Hemoglobin  Date Value Ref Range Status  04/26/2024 9.5 (L) 11.1 - 15.9 g/dL Final         Passed - ALT in normal range and within 360 days    ALT  Date Value Ref Range Status  04/26/2024 9 0 - 32 IU/L Final         Passed - AST in normal range and within 360 days    AST  Date Value Ref Range Status  04/26/2024 17 0 - 40 IU/L Final         Passed - PLT in normal range and within 360 days    Platelets  Date Value Ref Range Status  04/26/2024 287 150 - 450 x10E3/uL Final         Passed - eGFR is 15 or above and within 360 days    GFR, Est African American  Date Value Ref Range Status  08/06/2016 >89 >=60 mL/min Final   GFR calc Af Amer  Date Value Ref Range Status  09/17/2020 77 >59 mL/min/1.73 Final     Comment:    **In accordance with recommendations from the NKF-ASN Task force,**   [facility] is in the process of updating its eGFR calculation to the   2021 CKD-EPI creatinine equation that estimates kidney function   without a race variable.    GFR, Est Non African American  Date Value Ref Range Status  08/06/2016 86 >=60 mL/min Final   GFR, Estimated  Date Value Ref Range Status  05/15/2024 51 (L) >60 mL/min Final    Comment:    (NOTE) Calculated using the CKD-EPI Creatinine Equation (2021)    GFR  Date Value Ref Range Status  12/25/2013 117.88 >60.00 mL/min Final   eGFR  Date Value Ref Range Status  04/26/2024 45 (L) >59 mL/min/1.73 Final         Passed - Patient is not pregnant      Passed - Valid encounter within last 12 months    Recent Outpatient Visits  4 weeks ago Primary hypertension   [facility] [facility] Dept Of [place]. [facility] [place], [place] W, NP   4 months ago Type 2 diabetes mellitus with hyperglycemia, without long-term current use of insulin  [facility])   [place] [facility] - A Dept Of [place]. [facility] [place], [place] W, NP   6 months ago Type 2 diabetes mellitus with hyperglycemia, without long-term current use of insulin  [facility])   [place] [facility] - A Dept Of [place]. [facility] Theotis Haze ORN, NP   1 year ago Primary hypertension   [place] [facility] - A Dept Of [place]. [facility] Theotis Haze ORN, NP   1 year ago Primary hypertension   [place] [facility] - A Dept Of [place]. [facility] Theotis Haze ORN, NP       Future Appointments             In 2 months Theotis Haze ORN, NP [facility] - A Dept Of [place]. [facility], [address]             risperiDONE  (RISPERDAL ) 2 MG tablet 90 tablet 1    Sig: Take 1 tablet (2 mg total) by mouth at bedtime.     Not Delegated -  Psychiatry:  Antipsychotics - Second Generation (Atypical) - risperidone  Failed - 05/25/2024 12:03 PM      Failed - This refill cannot be delegated      Failed - TSH in normal range and within 360 days    TSH  Date Value Ref Range Status  11/10/2021 5.523 (H) 0.350 - 4.500 uIU/mL Final    Comment:    Performed by a 3rd Generation assay with a functional sensitivity of <=0.01 uIU/mL. Performed at [facility], [address] [place], [place] [address]   12/06/2013 3.544 0.350 - 4.500 uIU/mL Final    Comment:    Performed at [employer] [facility]         Failed - Last BP in normal range    BP Readings from Last 1 Encounters:  05/15/24 (!) 148/80         Failed - Lipid Panel in normal range within the last 12 months    Cholesterol, Total  Date Value Ref Range Status  12/22/2022 163 100 - 199 mg/dL Final   Cholesterol  Date Value Ref Range Status  05/15/2024 135 0 - 200 mg/dL Final   LDL Chol Calc (NIH)  Date Value Ref Range Status  12/22/2022 95 0 - 99 mg/dL Final   LDL Cholesterol  Date Value Ref Range Status  05/15/2024 70 0 - 99 mg/dL Final    Comment:           Total Cholesterol/HDL:CHD Risk Coronary Heart Disease Risk Table                     Men   Women  1/2 Average Risk   3.4   3.3  Average Risk       5.0   4.4  2 X Average Risk   9.6   7.1  3 X Average Risk  23.4   11.0        Use the calculated Patient Ratio above and the CHD Risk Table to determine the patient's CHD Risk.        ATP III CLASSIFICATION (  LDL):  <100     mg/dL   Optimal  899-870  mg/dL   Near or Above                    Optimal  130-159  mg/dL   Borderline  839-810  mg/dL   High  >809     mg/dL   Very High Performed at [facility], [address] [place], [place] [address]    HDL  Date Value Ref Range Status  05/15/2024 35 (L) >40 mg/dL Final  96/73/7975 51 >60 mg/dL Final   Triglycerides  Date Value Ref Range Status  05/15/2024 149 <150 mg/dL Final          Failed - CMP within normal limits and completed in the last 12 months    Albumin  Date Value Ref Range Status  04/26/2024 4.0 3.8 - 4.9 g/dL Final   Alkaline Phosphatase  Date Value Ref Range Status  04/26/2024 109 44 - 121 IU/L Final   ALT  Date Value Ref Range Status  04/26/2024 9 0 - 32 IU/L Final   AST  Date Value Ref Range Status  04/26/2024 17 0 - 40 IU/L Final   BUN  Date Value Ref Range Status  05/15/2024 23 (H) 6 - 20 mg/dL Final  92/69/7974 29 (H) 6 - 24 mg/dL Final   Calcium   Date Value Ref Range Status  05/15/2024 9.5 8.9 - 10.3 mg/dL Final   Calcium , Ion  Date Value Ref Range Status  08/07/2020 1.02 (L) 1.15 - 1.40 mmol/L Final   CO2  Date Value Ref Range Status  05/15/2024 22 22 - 32 mmol/L Final   Bicarbonate  Date Value Ref Range Status  04/21/2021 26.1 20.0 - 28.0 mmol/L Final   TCO2  Date Value Ref Range Status  08/07/2020 31 22 - 32 mmol/L Final   Creat  Date Value Ref Range Status  08/06/2016 0.81 0.50 - 1.10 mg/dL Final   Creatinine, Ser  Date Value Ref Range Status  05/15/2024 1.24 (H) 0.44 - 1.00 mg/dL Final   Creatinine, Urine  Date Value Ref Range Status  08/06/2018 197.25 mg/dL Final    Comment:    Performed at [facility], [address] [place], [place] [address]   Glucose, Bld  Date Value Ref Range Status  05/15/2024 180 (H) 70 - 99 mg/dL Final    Comment:    Glucose reference range applies only to samples taken after fasting for at least 8 hours.   POC Glucose  Date Value Ref Range Status  01/26/2024 152 (A) 70 - 99 mg/dl Final   Glucose-Capillary  Date Value Ref Range Status  10/28/2022 166 (H) 70 - 99 mg/dL Final    Comment:    Glucose reference range applies only to samples taken after fasting for at least 8 hours.   Potassium  Date Value Ref Range Status  05/15/2024 4.1 3.5 - 5.1 mmol/L Final   Sodium  Date Value Ref Range Status  05/15/2024 138 135 - 145 mmol/L Final  04/26/2024 142  134 - 144 mmol/L Final   Bilirubin Total  Date Value Ref Range Status  04/26/2024 <0.2 0.0 - 1.2 mg/dL Final   Bilirubin, Direct  Date Value Ref Range Status  12/30/2019 <0.1 0.0 - 0.2 mg/dL Final   Indirect Bilirubin  Date Value Ref Range Status  12/30/2019 NOT CALCULATED 0.3 - 0.9 mg/dL Final    Comment:    Performed at  [facility], [address], [place], [place] [address]   Protein, ur  Date Value Ref Range Status  08/06/2018 100 (A) NEGATIVE mg/dL Final   Total Protein  Date Value Ref Range Status  04/26/2024 7.3 6.0 - 8.5 g/dL Final   Total Protein ELP  Date Value Ref Range Status  06/10/2022 7.0 6.0 - 8.5 g/dL Corrected   GFR, Est African American  Date Value Ref Range Status  08/06/2016 >89 >=60 mL/min Final   GFR calc Af Amer  Date Value Ref Range Status  09/17/2020 77 >59 mL/min/1.73 Final    Comment:    **In accordance with recommendations from the NKF-ASN Task force,**   [facility] is in the process of updating its eGFR calculation to the   2021 CKD-EPI creatinine equation that estimates kidney function   without a race variable.    GFR  Date Value Ref Range Status  12/25/2013 117.88 >60.00 mL/min Final   eGFR  Date Value Ref Range Status  04/26/2024 45 (L) >59 mL/min/1.73 Final   GFR, Est Non African American  Date Value Ref Range Status  08/06/2016 86 >=60 mL/min Final   GFR, Estimated  Date Value Ref Range Status  05/15/2024 51 (L) >60 mL/min Final    Comment:    (NOTE) Calculated using the CKD-EPI Creatinine Equation (2021)          Passed - Completed PHQ-2 or PHQ-9 in the last 360 days      Passed - Last Heart Rate in normal range    Pulse Readings from Last 1 Encounters:  05/15/24 85         Passed - Valid encounter within last 6 months    Recent Outpatient Visits           4 weeks ago Primary hypertension   [place] [facility] Dept Of [place]. [facility] [place], [place] W, NP   4 months ago  Type 2 diabetes mellitus with hyperglycemia, without long-term current use of insulin  [facility])   [place] [facility] - A Dept Of [place]. [facility] [place], [place] W, NP   6 months ago Type 2 diabetes mellitus with hyperglycemia, without long-term current use of insulin  [facility])   [place] [facility] - A Dept Of [place]. [facility] Theotis Haze ORN, NP   1 year ago Primary hypertension   [place] [facility] - A Dept Of [place]. [facility] Theotis Haze ORN, NP   1 year ago Primary hypertension   [place] [facility] - A Dept Of [place]. [facility] Theotis Haze ORN, NP       Future Appointments             In 2 months Theotis Haze ORN, NP [facility] Health [facility] - A Dept Of [place]. [facility], [address]            Passed - CBC within normal limits and completed in the last 12 months    WBC  Date Value Ref Range Status  04/26/2024 6.3 3.4 - 10.8 x10E3/uL Final  05/11/2023 4.8 4.0 - 10.5 K/uL Final   RBC  Date Value Ref Range Status  04/26/2024 3.45 (L) 3.77 - 5.28 x10E6/uL Final  05/11/2023 4.27 3.87 - 5.11 MIL/uL Final   Hemoglobin  Date Value Ref Range Status  04/26/2024 9.5 (L) 11.1 - 15.9 g/dL Final  Hematocrit  Date Value Ref Range Status  04/26/2024 31.0 (L) 34.0 - 46.6 % Final   MCHC  Date Value Ref Range Status  04/26/2024 30.6 (L) 31.5 - 35.7 g/dL Final  91/86/7975 68.7 30.0 - 36.0 g/dL Final   [facility]  Date Value Ref Range Status  04/26/2024 27.5 26.6 - 33.0 pg Final  05/11/2023 27.4 26.0 - 34.0 pg Final   MCV  Date Value Ref Range Status  04/26/2024 90 79 - 97 fL Final   No results found for: PLTCOUNTKUC, LABPLAT, POCPLA RDW  Date Value Ref Range Status  04/26/2024 14.5 11.7 - 15.4 % Final          torsemide  (DEMADEX ) 20 MG tablet 120 tablet 5    Sig: Take 4 tablets (80 mg total) by mouth daily.     Cardiovascular:  Diuretics -  Loop Failed - 05/25/2024 12:03 PM      Failed - Cr in normal range and within 180 days    Creat  Date Value Ref Range Status  08/06/2016 0.81 0.50 - 1.10 mg/dL Final   Creatinine, Ser  Date Value Ref Range Status  05/15/2024 1.24 (H) 0.44 - 1.00 mg/dL Final   Creatinine, Urine  Date Value Ref Range Status  08/06/2018 197.25 mg/dL Final    Comment:    Performed at [facility], [address], [place], [place] [address]         Failed - Mg Level in normal range and within 180 days    Magnesium   Date Value Ref Range Status  05/27/2022 2.0 1.7 - 2.4 mg/dL Final    Comment:    Performed at [facility], [address], [place], [place] [address]         Failed - Last BP in normal range    BP Readings from Last 1 Encounters:  05/15/24 (!) 148/80         Passed - K in normal range and within 180 days    Potassium  Date Value Ref Range Status  05/15/2024 4.1 3.5 - 5.1 mmol/L Final         Passed - Ca in normal range and within 180 days    Calcium   Date Value Ref Range Status  05/15/2024 9.5 8.9 - 10.3 mg/dL Final   Calcium , Ion  Date Value Ref Range Status  08/07/2020 1.02 (L) 1.15 - 1.40 mmol/L Final         Passed - Na in normal range and within 180 days    Sodium  Date Value Ref Range Status  05/15/2024 138 135 - 145 mmol/L Final  04/26/2024 142 134 - 144 mmol/L Final         Passed - Cl in normal range and within 180 days    Chloride  Date Value Ref Range Status  05/15/2024 106 98 - 111 mmol/L Final         Passed - Valid encounter within last 6 months    Recent Outpatient Visits           4 weeks ago Primary hypertension   [place] [facility] Dept Of [place]. [facility] [place], [place] W, NP   4 months ago Type 2 diabetes mellitus with hyperglycemia, without long-term current use of insulin  [facility])   [place] [facility] - A Dept Of [place]. [facility] [place], [place] W, NP   6  months ago Type 2 diabetes  mellitus with hyperglycemia, without long-term current use of insulin  [facility])   [place] [facility] - A Dept Of [place]. [facility] Theotis Haze ORN, NP   1 year ago Primary hypertension   [place] [facility] [place] - A Dept Of [place]. [facility] Theotis Haze ORN, NP   1 year ago Primary hypertension   [place] [facility] [place] - A Dept Of [place]. [facility] Theotis Haze ORN, NP       Future Appointments             In 2 months Theotis Haze ORN, NP [facility] - A Dept Of [place]. [facility], [address]             solifenacin  (VESICARE ) 10 MG tablet 30 tablet 11    Sig: Take 1 tablet (10 mg total) by mouth daily.     Urology:  Bladder Agents 2 Failed - 05/25/2024 12:03 PM      Failed - Cr in normal range and within 360 days    Creat  Date Value Ref Range Status  08/06/2016 0.81 0.50 - 1.10 mg/dL Final   Creatinine, Ser  Date Value Ref Range Status  05/15/2024 1.24 (H) 0.44 - 1.00 mg/dL Final   Creatinine, Urine  Date Value Ref Range Status  08/06/2018 197.25 mg/dL Final    Comment:    Performed at [facility], [address], [place], [place] [address]         Passed - ALT in normal range and within 360 days    ALT  Date Value Ref Range Status  04/26/2024 9 0 - 32 IU/L Final         Passed - AST in normal range and within 360 days    AST  Date Value Ref Range Status  04/26/2024 17 0 - 40 IU/L Final         Passed - Valid encounter within last 12 months    Recent Outpatient Visits           4 weeks ago Primary hypertension   [place] [facility] [place] [facility] Dept Of [place]. [facility] [place], [place] W, NP   4 months ago Type 2 diabetes mellitus with hyperglycemia, without long-term current use of insulin  [facility])   [place] [facility] - A Dept Of [place]. [facility], [place] W, NP    6 months ago Type 2 diabetes mellitus with hyperglycemia, without long-term current use of insulin  [facility])   [place] [facility] - A Dept Of [place]. [facility] Theotis Haze ORN, NP   1 year ago Primary hypertension   [place] [facility] - A Dept Of [place]. [facility] Theotis Haze ORN, NP   1 year ago Primary hypertension   [place] [facility] [place] - A Dept Of [place]. [facility] Theotis Haze ORN, NP       Future Appointments             In 2 months Theotis Haze ORN, NP [facility] - A Dept Of [place]. [facility], [address]             spironolactone  (ALDACTONE ) 25 MG tablet 90 tablet 1    Sig: Take 1 tablet (25 mg total) by mouth daily.  Cardiovascular: Diuretics - Aldosterone Antagonist Failed - 05/25/2024 12:03 PM      Failed - Cr in normal range and within 180 days    Creat  Date Value Ref Range Status  08/06/2016 0.81 0.50 - 1.10 mg/dL Final   Creatinine, Ser  Date Value Ref Range Status  05/15/2024 1.24 (H) 0.44 - 1.00 mg/dL Final   Creatinine, Urine  Date Value Ref Range Status  08/06/2018 197.25 mg/dL Final    Comment:    Performed at [facility], [address] [place], [place] [address]         Failed - Last BP in normal range    BP Readings from Last 1 Encounters:  05/15/24 (!) 148/80         Passed - K in normal range and within 180 days    Potassium  Date Value Ref Range Status  05/15/2024 4.1 3.5 - 5.1 mmol/L Final         Passed - Na in normal range and within 180 days    Sodium  Date Value Ref Range Status  05/15/2024 138 135 - 145 mmol/L Final  04/26/2024 142 134 - 144 mmol/L Final         Passed - eGFR is 30 or above and within 180 days    GFR, Est African American  Date Value Ref Range Status  08/06/2016 >89 >=60 mL/min Final   GFR calc Af Amer  Date Value Ref Range Status  09/17/2020 77 >59 mL/min/1.73 Final     Comment:    **In accordance with recommendations from the NKF-ASN Task force,**   [facility] is in the process of updating its eGFR calculation to the   2021 CKD-EPI creatinine equation that estimates kidney function   without a race variable.    GFR, Est Non African American  Date Value Ref Range Status  08/06/2016 86 >=60 mL/min Final   GFR, Estimated  Date Value Ref Range Status  05/15/2024 51 (L) >60 mL/min Final    Comment:    (NOTE) Calculated using the CKD-EPI Creatinine Equation (2021)    GFR  Date Value Ref Range Status  12/25/2013 117.88 >60.00 mL/min Final   eGFR  Date Value Ref Range Status  04/26/2024 45 (L) >59 mL/min/1.73 Final         Passed - Valid encounter within last 6 months    Recent Outpatient Visits           4 weeks ago Primary hypertension   [place] [facility] [place] Dept Of [place]. [facility] [place], [place] W, NP   4 months ago Type 2 diabetes mellitus with hyperglycemia, without long-term current use of insulin  [facility])   [place] [facility] - A Dept Of [place]. [facility] [place], [place] W, NP   6 months ago Type 2 diabetes mellitus with hyperglycemia, without long-term current use of insulin  [facility])   [place] [facility] - A Dept Of [place]. [facility] Theotis Haze ORN, NP   1 year ago Primary hypertension   [place] [facility] [place] - A Dept Of [place]. [facility] Theotis Haze ORN, NP   1 year ago Primary hypertension   [place] [facility] [place] - A Dept Of [place]. [facility] Theotis Haze ORN, NP       Future Appointments             In  2 months Fleming, [name] W, NP [place] [facility] - A Dept Of [place]. [facility] [place]

## 2024-05-31 ENCOUNTER — Other Ambulatory Visit: Payer: Self-pay

## 2024-06-02 ENCOUNTER — Ambulatory Visit

## 2024-06-05 ENCOUNTER — Other Ambulatory Visit: Payer: Self-pay

## 2024-06-06 ENCOUNTER — Other Ambulatory Visit: Payer: Self-pay

## 2024-06-12 ENCOUNTER — Telehealth: Payer: Self-pay | Admitting: Nurse Practitioner

## 2024-06-12 NOTE — Telephone Encounter (Signed)
 2nd attempt contacted pt left vm to call back to resch appt

## 2024-07-04 ENCOUNTER — Telehealth: Payer: Self-pay | Admitting: Nurse Practitioner

## 2024-07-04 NOTE — Telephone Encounter (Signed)
 3rd  attempt reach out to the pt to resch appt pcp not in the office lvm.

## 2024-07-13 ENCOUNTER — Other Ambulatory Visit (HOSPITAL_COMMUNITY): Payer: Self-pay

## 2024-07-16 ENCOUNTER — Other Ambulatory Visit (HOSPITAL_COMMUNITY): Payer: Self-pay | Admitting: Cardiology

## 2024-07-17 ENCOUNTER — Other Ambulatory Visit: Payer: Self-pay

## 2024-07-18 ENCOUNTER — Other Ambulatory Visit: Payer: Self-pay | Admitting: Nurse Practitioner

## 2024-07-18 ENCOUNTER — Other Ambulatory Visit: Payer: Self-pay

## 2024-07-18 DIAGNOSIS — E1165 Type 2 diabetes mellitus with hyperglycemia: Secondary | ICD-10-CM

## 2024-07-19 ENCOUNTER — Other Ambulatory Visit: Payer: Self-pay

## 2024-07-19 MED ORDER — OZEMPIC (1 MG/DOSE) 4 MG/3ML ~~LOC~~ SOPN
1.0000 mg | PEN_INJECTOR | SUBCUTANEOUS | 3 refills | Status: DC
  Filled 2024-07-19 – 2024-07-31 (×2): qty 3, 28d supply, fill #0
  Filled 2024-09-07: qty 3, 28d supply, fill #1

## 2024-07-24 ENCOUNTER — Other Ambulatory Visit: Payer: Self-pay

## 2024-07-27 ENCOUNTER — Other Ambulatory Visit: Payer: Self-pay

## 2024-07-28 ENCOUNTER — Other Ambulatory Visit: Payer: Self-pay

## 2024-07-28 ENCOUNTER — Ambulatory Visit: Admitting: Nurse Practitioner

## 2024-07-31 ENCOUNTER — Other Ambulatory Visit (HOSPITAL_COMMUNITY): Payer: Self-pay | Admitting: Cardiology

## 2024-07-31 ENCOUNTER — Other Ambulatory Visit: Payer: Self-pay

## 2024-08-07 ENCOUNTER — Other Ambulatory Visit: Payer: Self-pay

## 2024-08-14 ENCOUNTER — Other Ambulatory Visit: Payer: Self-pay

## 2024-08-16 ENCOUNTER — Other Ambulatory Visit: Payer: Self-pay

## 2024-08-17 ENCOUNTER — Other Ambulatory Visit: Payer: Self-pay

## 2024-09-07 ENCOUNTER — Other Ambulatory Visit: Payer: Self-pay

## 2024-09-12 ENCOUNTER — Encounter: Payer: Self-pay | Admitting: Nurse Practitioner

## 2024-09-12 ENCOUNTER — Other Ambulatory Visit: Payer: Self-pay

## 2024-09-12 ENCOUNTER — Ambulatory Visit: Attending: Nurse Practitioner | Admitting: Nurse Practitioner

## 2024-09-12 VITALS — BP 178/93 | HR 112 | Ht 67.0 in | Wt 356.8 lb

## 2024-09-12 DIAGNOSIS — M25562 Pain in left knee: Secondary | ICD-10-CM | POA: Diagnosis not present

## 2024-09-12 DIAGNOSIS — E119 Type 2 diabetes mellitus without complications: Secondary | ICD-10-CM | POA: Diagnosis not present

## 2024-09-12 DIAGNOSIS — Z7985 Long-term (current) use of injectable non-insulin antidiabetic drugs: Secondary | ICD-10-CM | POA: Diagnosis not present

## 2024-09-12 DIAGNOSIS — D649 Anemia, unspecified: Secondary | ICD-10-CM | POA: Diagnosis not present

## 2024-09-12 DIAGNOSIS — E785 Hyperlipidemia, unspecified: Secondary | ICD-10-CM | POA: Diagnosis not present

## 2024-09-12 DIAGNOSIS — M25561 Pain in right knee: Secondary | ICD-10-CM

## 2024-09-12 DIAGNOSIS — I5032 Chronic diastolic (congestive) heart failure: Secondary | ICD-10-CM

## 2024-09-12 DIAGNOSIS — G8929 Other chronic pain: Secondary | ICD-10-CM

## 2024-09-12 DIAGNOSIS — E1165 Type 2 diabetes mellitus with hyperglycemia: Secondary | ICD-10-CM

## 2024-09-12 DIAGNOSIS — I10 Essential (primary) hypertension: Secondary | ICD-10-CM

## 2024-09-12 DIAGNOSIS — Z23 Encounter for immunization: Secondary | ICD-10-CM

## 2024-09-12 MED ORDER — CARVEDILOL 25 MG PO TABS
25.0000 mg | ORAL_TABLET | Freq: Two times a day (BID) | ORAL | 1 refills | Status: AC
  Filled 2024-09-12: qty 180, 90d supply, fill #0
  Filled 2024-09-24: qty 180, 90d supply, fill #1

## 2024-09-12 MED ORDER — GLIPIZIDE ER 10 MG PO TB24
10.0000 mg | ORAL_TABLET | Freq: Every day | ORAL | 1 refills | Status: AC
  Filled 2024-09-12: qty 90, 90d supply, fill #0

## 2024-09-12 MED ORDER — TORSEMIDE 20 MG PO TABS
80.0000 mg | ORAL_TABLET | Freq: Every day | ORAL | 0 refills | Status: DC
  Filled 2024-09-12: qty 20, 5d supply, fill #0

## 2024-09-12 MED ORDER — METFORMIN HCL 1000 MG PO TABS
1000.0000 mg | ORAL_TABLET | Freq: Two times a day (BID) | ORAL | 1 refills | Status: AC
  Filled 2024-09-12: qty 180, 90d supply, fill #0

## 2024-09-12 MED ORDER — SEMAGLUTIDE (2 MG/DOSE) 8 MG/3ML ~~LOC~~ SOPN
2.0000 mg | PEN_INJECTOR | SUBCUTANEOUS | 6 refills | Status: DC
  Filled 2024-09-12: qty 3, 28d supply, fill #0

## 2024-09-12 MED ORDER — TORSEMIDE 20 MG PO TABS
80.0000 mg | ORAL_TABLET | Freq: Every day | ORAL | 5 refills | Status: DC
  Filled 2024-09-12: qty 120, 30d supply, fill #0

## 2024-09-12 MED ORDER — HYDRALAZINE HCL 25 MG PO TABS
25.0000 mg | ORAL_TABLET | Freq: Two times a day (BID) | ORAL | 1 refills | Status: AC
  Filled 2024-09-12: qty 180, 90d supply, fill #0

## 2024-09-12 NOTE — Progress Notes (Unsigned)
 Assessment & Plan:  Diagnoses and all orders for this visit:  Primary hypertension -     CMP14+EGFR -     carvedilol  (COREG ) 25 MG tablet; Take 1 tablet (25 mg total) by mouth 2 (two) times daily with a meal. -     glipiZIDE  (GLUCOTROL  XL) 10 MG 24 hr tablet; Take 1 tablet (10 mg total) by mouth daily with breakfast. -     hydrALAZINE  (APRESOLINE ) 25 MG tablet; Take 1 tablet (25 mg total) by mouth 2 (two) times daily at 10 AM and 5 PM.  Need for influenza vaccination -     Flu vaccine trivalent PF, 6mos and older(Flulaval,Afluria,Fluarix,Fluzone)  Type 2 diabetes mellitus with hyperglycemia, without long-term current use of insulin  (HCC) -     Hemoglobin A1c -     CMP14+EGFR -     metFORMIN  (GLUCOPHAGE ) 1000 MG tablet; Take 1 tablet (1,000 mg total) by mouth 2 (two) times daily with a meal.  Chronic pain of both knees -     DG Knee Complete 4 Views Left; Future -     DG Knee Complete 4 Views Right; Future  Other orders -     torsemide  (DEMADEX ) 20 MG tablet; Take 4 tablets (80 mg total) by mouth daily. -     Semaglutide , 2 MG/DOSE, 8 MG/3ML SOPN; Inject 2 mg as directed once a week.    Patient has been counseled on age-appropriate routine health concerns for screening and prevention. These are reviewed and up-to-date. Referrals have been placed accordingly. Immunizations are up-to-date or declined.    Subjective:  No chief complaint on file.   [name] 20 58 y.o. female presents to office today    HPI  ROS  Past Medical History:  Diagnosis Date   Acute on chronic diastolic congestive heart failure (HCC) 11/02/2013   10/03/2015, 11/13/2015, 08/03/2017   Atrial fibrillation (HCC)    Benign essential HTN 11/28/2013   Bipolar disease, chronic (HCC)    Chest pain    a. 2012 Myoview : EF 63%, no isch/infarct;  b. 04/2016 Lexiscan  MV: EF 73%, no ischemia/infarct-->Low risk.   Chronic diastolic CHF (congestive heart failure) (HCC) 07/23/2011   a. 2015 Echo: EF 55-60%, Gr2  DD;  b. 09/2015 Echo: EF 60-65%, no rwma, mod dil LA, PASP .   Cor pulmonale (chronic) (HCC)    History of thyrotoxicosis    HTN (hypertension) 11/28/2013   Hypertensive heart disease 10/18/2013   Hypoglycemia    Insulin  dependent type 2 diabetes mellitus, uncontrolled    Mediastinal adenopathy    Morbid obesity due to excess calories (HCC) 02/19/2011   Morbid obesity with BMI of 50.0-59.9, adult (HCC)    OSA (obstructive sleep apnea) 03/06/2011   Persistent atrial fibrillation (HCC) 12/09/2017   Pulmonary HTN, moderate to severe 11/03/2013   Sinusitis, chronic 01/02/2015   SVT (supraventricular tachycardia) 12/06/2013   Uncontrolled type 2 diabetes mellitus with hyperglycemia [facility])     Past Surgical History:  Procedure Laterality Date   CARDIOVERSION N/A 04/05/2018   Procedure: CARDIOVERSION;  Surgeon: Pietro Redell RAMAN, MD;  Location: [facility] ENDOSCOPY;  Service: Cardiovascular;  Laterality: N/A;   COLONOSCOPY WITH PROPOFOL  Left 07/16/2018   Procedure: COLONOSCOPY WITH PROPOFOL ;  Surgeon: Saintclair Jasper, MD;  Location: [facility] ENDOSCOPY;  Service: Gastroenterology;  Laterality: Left;   LEFT HEART CATH AND CORONARY ANGIOGRAPHY N/A 08/04/2018   Procedure: LEFT HEART CATH AND CORONARY ANGIOGRAPHY;  Surgeon: Claudene Victory ORN, MD;  Location: [facility] INVASIVE CV LAB;  Service: Cardiovascular;  Laterality: N/A;   None     POLYPECTOMY  07/16/2018   Procedure: POLYPECTOMY;  Surgeon: Saintclair Jasper, MD;  Location: [facility] ENDOSCOPY;  Service: Gastroenterology;;    Family History  Problem Relation Age of Onset   Heart failure Father    Stroke Father    Hypertension Mother    Heart disease Maternal Grandfather     Social History Reviewed with no changes to be made today.   Outpatient Medications Prior to Visit  Medication Sig Dispense Refill   amLODipine  (NORVASC ) 10 MG tablet Take 1 tablet (10 mg total) by mouth daily. 30 tablet 6   hydrOXYzine  (ATARAX ) 10 MG tablet Take 1 tablet (10 mg total) by mouth 3  (three) times daily as needed for itching. 60 tablet 1   levocetirizine (XYZAL ) 5 MG tablet Take 1 tablet (5 mg total) by mouth every evening. 30 tablet 6   Misc. Devices MISC Please provide patient with adult incontinence underwear or pullups. Size 2-3x.  N39.3 1 each 0   risperiDONE  (RISPERDAL ) 2 MG tablet Take 1 tablet (2 mg total) by mouth at bedtime. 90 tablet 1   rivaroxaban  (XARELTO ) 20 MG TABS tablet Take 1 tablet (20 mg total) by mouth daily with supper. 30 tablet 5   sacubitril -valsartan  (ENTRESTO ) 97-103 MG Take 1 tablet by mouth 2 (two) times daily. 60 tablet 10   Semaglutide , 1 MG/DOSE, (OZEMPIC , 1 MG/DOSE,) 4 MG/3ML SOPN Inject 1 mg as directed once a week. 3 mL 3   solifenacin  (VESICARE ) 10 MG tablet Take 1 tablet (10 mg total) by mouth daily. 30 tablet 11   spironolactone  (ALDACTONE ) 25 MG tablet Take 1 tablet (25 mg total) by mouth daily. 90 tablet 1   carvedilol  (COREG ) 25 MG tablet Take 1 tablet (25 mg total) by mouth 2 (two) times daily with a meal. 180 tablet 1   glipiZIDE  (GLUCOTROL  XL) 10 MG 24 hr tablet Take 1 tablet (10 mg total) by mouth daily with breakfast. 90 tablet 1   hydrALAZINE  (APRESOLINE ) 25 MG tablet Take 1 tablet (25 mg total) by mouth 2 (two) times daily at 10 AM and 5 PM. 180 tablet 1   metFORMIN  (GLUCOPHAGE ) 1000 MG tablet Take 1 tablet (1,000 mg total) by mouth 2 (two) times daily with a meal. 180 tablet 1   Accu-Chek Softclix Lancets lancets Use 2 (two) times daily. (Patient not taking: Reported on 09/12/2024) 100 each 1   Blood Glucose Monitoring Suppl (ACCU-CHEK GUIDE) w/Device KIT use to check blood sugar (Patient not taking: Reported on 09/12/2024) 1 kit 0   glucose blood (TRUE METRIX BLOOD GLUCOSE TEST) test strip Use as instructed (Patient not taking: Reported on 09/12/2024) 100 each 12   torsemide  (DEMADEX ) 20 MG tablet Take 4 tablets (80 mg total) by mouth daily. (Patient not taking: Reported on 09/12/2024) 120 tablet 5   No facility-administered  medications prior to visit.    Allergies[1]     Objective:    BP (!) 178/93 (BP Location: Left Arm, Patient Position: Sitting, Cuff Size: Normal)   Pulse (!) 112   Ht 5' 7 (1.702 m)   Wt (!) 356 lb 12.8 oz (161.8 kg)   SpO2 98%   BMI 55.88 kg/m  Wt Readings from Last 3 Encounters:  09/12/24 (!) 356 lb 12.8 oz (161.8 kg)  05/15/24 (!) 340 lb 3.2 oz (154.3 kg)  04/26/24 (!) 348 lb 6.4 oz (158 kg)    Physical Exam       Patient has  been counseled extensively about nutrition and exercise as well as the importance of adherence with medications and regular follow-up. The patient was given clear instructions to go to ER or return to medical center if symptoms don't improve, worsen or new problems develop. The patient verbalized understanding.   Follow-up: Return in about 3 months (around 12/11/2024).   Haze LELON Servant, FNP-BC [facility] [place], [place] [phone]   09/12/2024, 3:07 PM    [1]  Allergies Allergen Reactions   Acetaminophen  Other (See Comments)    Seizure-like fits as a child   Farxiga  [Dapagliflozin ] Other (See Comments)    Hallucinations, drop in blood sugar   Porcine (Pork) Protein-Containing Drug Products Other (See Comments)    Religious reasons. Patient verified on 1/26 okay to get heparin  products if necessary   Lisinopril  Rash    Rash with lisinopril ; but fosinopril  is ok per patient

## 2024-09-13 ENCOUNTER — Telehealth (HOSPITAL_COMMUNITY): Payer: Self-pay

## 2024-09-13 LAB — CBC WITH DIFFERENTIAL/PLATELET
Basophils Absolute: 0 x10E3/uL (ref 0.0–0.2)
Basos: 0 %
EOS (ABSOLUTE): 0.1 x10E3/uL (ref 0.0–0.4)
Eos: 2 %
Hematocrit: 35.2 % (ref 34.0–46.6)
Hemoglobin: 11.1 g/dL (ref 11.1–15.9)
Immature Grans (Abs): 0 x10E3/uL (ref 0.0–0.1)
Immature Granulocytes: 0 %
Lymphocytes Absolute: 1 x10E3/uL (ref 0.7–3.1)
Lymphs: 21 %
MCH: 27.3 pg (ref 26.6–33.0)
MCHC: 31.5 g/dL (ref 31.5–35.7)
MCV: 87 fL (ref 79–97)
Monocytes Absolute: 0.4 x10E3/uL (ref 0.1–0.9)
Monocytes: 9 %
Neutrophils Absolute: 3.3 x10E3/uL (ref 1.4–7.0)
Neutrophils: 68 %
Platelets: 183 x10E3/uL (ref 150–450)
RBC: 4.06 x10E6/uL (ref 3.77–5.28)
RDW: 15.4 % (ref 11.7–15.4)
WBC: 4.8 x10E3/uL (ref 3.4–10.8)

## 2024-09-13 LAB — CMP14+EGFR
ALT: 9 IU/L (ref 0–32)
AST: 18 IU/L (ref 0–40)
Albumin: 4.1 g/dL (ref 3.8–4.9)
Alkaline Phosphatase: 102 IU/L (ref 49–135)
BUN/Creatinine Ratio: 15 (ref 9–23)
BUN: 18 mg/dL (ref 6–24)
Bilirubin Total: 0.4 mg/dL (ref 0.0–1.2)
CO2: 22 mmol/L (ref 20–29)
Calcium: 9 mg/dL (ref 8.7–10.2)
Chloride: 105 mmol/L (ref 96–106)
Creatinine, Ser: 1.17 mg/dL — ABNORMAL HIGH (ref 0.57–1.00)
Globulin, Total: 2.9 g/dL (ref 1.5–4.5)
Glucose: 73 mg/dL (ref 70–99)
Potassium: 5.1 mmol/L (ref 3.5–5.2)
Sodium: 140 mmol/L (ref 134–144)
Total Protein: 7 g/dL (ref 6.0–8.5)
eGFR: 55 mL/min/1.73 — ABNORMAL LOW (ref >59)

## 2024-09-13 LAB — HEMOGLOBIN A1C
Est. average glucose Bld gHb Est-mCnc: 126 mg/dL
Hgb A1c MFr Bld: 6 % — ABNORMAL HIGH (ref 4.8–5.6)

## 2024-09-13 NOTE — Telephone Encounter (Signed)
 Called to confirm/remind patient of their appointment at the [facility] on 09/14/24.   Appointment:   [] Confirmed  [x] Left mess   [] No answer/No voice mail  [] VM Full/unable to leave message  [] Phone not in service  And to bring in all medications and/or complete list.

## 2024-09-14 ENCOUNTER — Other Ambulatory Visit: Payer: Self-pay

## 2024-09-14 ENCOUNTER — Encounter (HOSPITAL_COMMUNITY): Payer: Self-pay

## 2024-09-14 ENCOUNTER — Ambulatory Visit (HOSPITAL_COMMUNITY)
Admission: RE | Admit: 2024-09-14 | Discharge: 2024-09-14 | Disposition: A | Discharge status: Home / Self Care | Source: Ambulatory Visit | Attending: Cardiology | Admitting: Cardiology

## 2024-09-14 VITALS — BP 134/90 | HR 98 | Wt 314.0 lb

## 2024-09-14 DIAGNOSIS — I5032 Chronic diastolic (congestive) heart failure: Secondary | ICD-10-CM | POA: Diagnosis not present

## 2024-09-14 DIAGNOSIS — Z560 Unemployment, unspecified: Secondary | ICD-10-CM | POA: Insufficient documentation

## 2024-09-14 DIAGNOSIS — Z7985 Long-term (current) use of injectable non-insulin antidiabetic drugs: Secondary | ICD-10-CM | POA: Insufficient documentation

## 2024-09-14 DIAGNOSIS — F319 Bipolar disorder, unspecified: Secondary | ICD-10-CM | POA: Diagnosis not present

## 2024-09-14 DIAGNOSIS — Z79899 Other long term (current) drug therapy: Secondary | ICD-10-CM | POA: Insufficient documentation

## 2024-09-14 DIAGNOSIS — Q245 Malformation of coronary vessels: Secondary | ICD-10-CM | POA: Diagnosis not present

## 2024-09-14 DIAGNOSIS — E1169 Type 2 diabetes mellitus with other specified complication: Secondary | ICD-10-CM | POA: Diagnosis not present

## 2024-09-14 DIAGNOSIS — I4821 Permanent atrial fibrillation: Secondary | ICD-10-CM | POA: Diagnosis not present

## 2024-09-14 DIAGNOSIS — Z7984 Long term (current) use of oral hypoglycemic drugs: Secondary | ICD-10-CM | POA: Insufficient documentation

## 2024-09-14 DIAGNOSIS — E119 Type 2 diabetes mellitus without complications: Secondary | ICD-10-CM | POA: Insufficient documentation

## 2024-09-14 DIAGNOSIS — I110 Hypertensive heart disease with heart failure: Secondary | ICD-10-CM | POA: Diagnosis present

## 2024-09-14 DIAGNOSIS — Z91199 Patient's noncompliance with other medical treatment and regimen due to unspecified reason: Secondary | ICD-10-CM | POA: Insufficient documentation

## 2024-09-14 DIAGNOSIS — I4819 Other persistent atrial fibrillation: Secondary | ICD-10-CM | POA: Diagnosis not present

## 2024-09-14 DIAGNOSIS — I10 Essential (primary) hypertension: Secondary | ICD-10-CM

## 2024-09-14 DIAGNOSIS — G4733 Obstructive sleep apnea (adult) (pediatric): Secondary | ICD-10-CM | POA: Insufficient documentation

## 2024-09-14 DIAGNOSIS — Z7901 Long term (current) use of anticoagulants: Secondary | ICD-10-CM | POA: Insufficient documentation

## 2024-09-14 DIAGNOSIS — Z6841 Body Mass Index (BMI) 40.0 and over, adult: Secondary | ICD-10-CM | POA: Diagnosis not present

## 2024-09-14 DIAGNOSIS — Z8249 Family history of ischemic heart disease and other diseases of the circulatory system: Secondary | ICD-10-CM | POA: Insufficient documentation

## 2024-09-14 MED ORDER — TORSEMIDE 20 MG PO TABS
80.0000 mg | ORAL_TABLET | Freq: Every day | ORAL | 6 refills | Status: AC
  Filled 2024-09-14: qty 360, 90d supply, fill #0
  Filled 2024-09-29: qty 120, 30d supply, fill #0

## 2024-09-14 NOTE — Progress Notes (Signed)
 ADVANCED [facility] CLINIC NOTE  PCP: Theotis Haze ORN, NP HF Cardiologist: Dr. Rolan  HPI: Pt is a 58 y.o.female with a history of chronic diastolic heart failure, persistent atrial fibrillation on Xarelto , hypertension, obesity and OSA, not compliant w/ CPAP.  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  She had VFib with heart cath, treated w/ defibrillation to NSR. She also developed AKI after cath.  Echo 04/18/21 with EF 60-65%, no RWMA, severe LVH, RV normal with moderately elevated PA systolic pressure, Lt atrial size severely dilated and RA mildly dilated.  Mild MR, mod to severe TR.   Of note, brother had sudden cardiac death around age 45 and father and grandmother had CHF.    Admitted 2/23 for a/c diastolic CHF w/ fluid overload. Afib well rate controlled. Diuresed w/ IV Lasix , then transitioned to GDMT. Discharge home, weight 328 lbs.  Admitted 8/23 with acute on chronic HFpEF. Ran out her medications while traveling out of the country. Diuresed with IV lasix  and transitioned to torsemide  40 mg bid. Echo showed LVEF 60-65%, severe LVH, septal thickenness over 20 mm but difficult to measure, RV normal.  Discharge weight 308 pounds.   cMRI 12/23 showed severe LAE with moderate RAE, severe basal septal hypertrophy 17 mm with LVEF normal 64%, no RWMAs, normal RV size and function RVEF 51%, abnormal delayed enhancement images with mottled LGE in apex and septum and more defined area of moderate LGE in mid inferior wall, normal parametric measures.  Unlikely cardiac amyloidosis, consider sarcoidosis or HCM.  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 She also had a RYR2 gene mutation.   7 day Zio 2/24 showed atrial fibrillation occurred continuously (100% burden), ranging from 56-133 bpm  (avg of 81 bpm). Isolated VEs were rare (<1.0%, 1345), VE Couplets were rare (<1.0%, 90), and VE Triplets were rare (<1.0%).    She was seen by EP, and after discussion, it was decided that she did not meet criteria for ICD.   Echo 8/25 showed EF 65-70%, mild LVH, normal RV, IVC normal.   8/25: Today she returns for HF follow up. BP high today but has not taken any meds yet.  Says SBP generally runs 130s-140s when she checks.  She denies exertional dyspnea or chest pain.  Says she has slept in a recliner for years.  No lightheadedness or palpitations.  Weight is unchanged, she is frustrated that she has not lost weight with semaglutide .   She was seen by PCP 09/12/24, noted to by hypertensive 170/90s. Unsure of whether this is before or after taking her medications.  She returns today for HF follow up. Overall feeling poorly. Reports that she is short of breath with exertion and at rest. On exam appears comfortable. No CP or dizziness. Weight at home stable 333 lbs. She is not walking much, uses WC. She has not taken her medications yet today. She does not want to change any of her medications, she would like to decrease her medications. Refuses to increase her diuretic regimen in any way, discussed increase morning dose, taking a twice daily dosing, and use of intermittent metolazone  dosing.  States that she would like to go into the hospital.   Labs (3/24): Scr 1.10, K 4.3  Labs (4/24): SCr 1.28, K 4.2 Labs (6/24): K 4.5,  creatinine 1.4 Labs (8/24): K 4.5, creatinine 1.5 Labs (7/25): K 4.5, creatinine 1.39  PMH: 1. Atrial fibrillation: Permanent since 2019.  2. HTN 3. OSA: Unable to tolerate CPAP.  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   - Cardiolite  (8/17): No ischemia 5. Chronic diastolic CHF: Echo (8/23): EF 39-34%, severe LVH, Septal thickenness over 20mm but difficult to measure, RV normal.   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 EF >65%.  - Chest CT 2019 Prominent mediastinal lymph nodes, likely reactive. Lungs/Pleura: +Calcified granulomas. - Cardiac MRI (12/23): Severe LAE with moderate RAE, severe basal septal hypertrophy 17 mm with LVEF normal 64%, no RWMAs, normal RV size and function RVEF 51%, abnormal delayed enhancement images with mottled LGE in apex and septum and more defined area of moderate LGE in mid inferior wall, normal parametric measures.   - [employer] gene testing showed pathogenic variant of PKP2, associated w/ arrhythmogenic cardiomyopathy. She also had a RYR2 gene mutation.  - Echo (8/25): EF 65-70%, mild LVH, normal RV, IVC normal.  6. Bipolar disorder 7. 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 Then was enrolled in a MPH program at [employer]. But ran out of money and is no longer attending school. (Note patient has bipolar disorder).      [place]  but lived in [place] before coming back to [place] .       Primary language is Arabic. Lives with mother and brother.   Social Drivers of Health   Tobacco Use: Low Risk (09/12/2024)   Patient History    Smoking Tobacco Use: Never    Smokeless Tobacco Use: Never    Passive Exposure: Not on file  Financial Resource Strain: Low Risk (05/26/2022)   Overall Financial Resource Strain (CARDIA)    Difficulty of Paying Living  Expenses: Not very hard  Food Insecurity: No Food Insecurity (10/23/2022)   Hunger Vital Sign    Worried About Running Out of Food in the Last Year: Never true    Ran Out of Food in the Last Year: Never true  Transportation Needs: Unmet Transportation Needs (10/23/2022)   PRAPARE - Administrator, Civil Service (Medical): Yes    Lack of Transportation (Non-Medical): Yes  Physical Activity: Not on file  Stress: Not on file  Social Connections: Not on file  Intimate Partner Violence: Not At Risk (10/23/2022)   Humiliation, Afraid, Rape, and Kick questionnaire    Fear of Current or Ex-Partner: No    Emotionally Abused: No    Physically Abused: No    Sexually Abused: No  Depression (PHQ2-9): Low Risk (09/12/2024)   Depression (PHQ2-9)    PHQ-2 Score: 1  Alcohol Screen: Low Risk (05/26/2022)  Alcohol Screen    Last Alcohol Screening Score (AUDIT): 0  Housing: Low Risk (10/23/2022)   Housing    Last Housing Risk Score: 0  Utilities: At Risk (10/23/2022)   AHC Utilities    Threatened with loss of utilities: Yes  Health Literacy: Not on file   FH:  Brother with sudden cardiac death, father with CVA  ROS: All systems reviewed and negative except as per HPI.   Current Outpatient Medications  Medication Sig Dispense Refill   Accu-Chek Softclix Lancets lancets Use 2 (two) times daily. 100 each 1   amLODipine  (NORVASC ) 10 MG tablet Take 1 tablet (10 mg total) by mouth daily. 30 tablet 6   Blood Glucose Monitoring Suppl (ACCU-CHEK GUIDE) w/Device KIT use to check blood sugar 1 kit 0   carvedilol  (COREG ) 25 MG tablet Take 1 tablet (25 mg total) by mouth 2 (two) times daily with a meal. 180 tablet 1   glipiZIDE  (GLUCOTROL  XL) 10 MG 24 hr tablet Take 1 tablet (10 mg total) by mouth daily with breakfast. 90 tablet 1   glucose blood (TRUE METRIX BLOOD GLUCOSE TEST) test strip Use as instructed 100 each 12   hydrALAZINE  (APRESOLINE ) 25 MG tablet Take 1 tablet (25 mg total) by mouth 2  (two) times daily at 10 AM and 5 PM. 180 tablet 1   hydrOXYzine  (ATARAX ) 10 MG tablet Take 1 tablet (10 mg total) by mouth 3 (three) times daily as needed for itching. 60 tablet 1   levocetirizine (XYZAL ) 5 MG tablet Take 1 tablet (5 mg total) by mouth every evening. 30 tablet 6   metFORMIN  (GLUCOPHAGE ) 1000 MG tablet Take 1 tablet (1,000 mg total) by mouth 2 (two) times daily with a meal. 180 tablet 1   Misc. Devices MISC Please provide patient with adult incontinence underwear or pullups. Size 2-3x.  N39.3 1 each 0   risperiDONE  (RISPERDAL ) 2 MG tablet Take 1 tablet (2 mg total) by mouth at bedtime. 90 tablet 1   rivaroxaban  (XARELTO ) 20 MG TABS tablet Take 1 tablet (20 mg total) by mouth daily with supper. 30 tablet 5   sacubitril -valsartan  (ENTRESTO ) 97-103 MG Take 1 tablet by mouth 2 (two) times daily. 60 tablet 10   Semaglutide , 2 MG/DOSE, 8 MG/3ML SOPN Inject 2 mg as directed once a week. 3 mL 6   solifenacin  (VESICARE ) 10 MG tablet Take 1 tablet (10 mg total) by mouth daily. 30 tablet 11   spironolactone  (ALDACTONE ) 25 MG tablet Take 1 tablet (25 mg total) by mouth daily. 90 tablet 1   torsemide  (DEMADEX ) 20 MG tablet Take 4 tablets (80 mg total) by mouth daily. 20 tablet 0   No current facility-administered medications for this encounter.   BP (!) 134/90   Pulse 98   Wt (!) 142.4 kg (314 lb)   SpO2 98%   BMI 49.18 kg/m   Wt Readings from Last 3 Encounters:  09/14/24 (!) 142.4 kg (314 lb)  09/12/24 (!) 161.8 kg (356 lb 12.8 oz)  05/15/24 (!) 154.3 kg (340 lb 3.2 oz)   PHYSICAL EXAM: General: Obese appearing. No distress  Cardiac: JVP difficult to assess. No murmurs  Resp: Lung sounds clear and equal B/L Extremities: Warm and dry.  Trace BLE edema.  Neuro: A&O x3.   ASSESSMENT & PLAN: 1. Chronic Diastolic CHF:  Echo (8/23) with EF 60-65%, severe LVH, Septal thickenness over 20mm but difficult to measure, RV normal.   Severe LVH may be secondary to HTN, but  also was concerned  for possible hypertrophic cardiomyopathy.  Father/grandmother with CHF and brother with ?sudden death is concerning for familial cardiomyopathy.  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 cMRI w/ normal sized RV and function, RVEF 51%, severe basal septal hypertrophy 17 mm, LVEF normal 64% no RWMAs, abnormal LGE in con-coronary pattern with mottled uptake in apex and septum and more defined area of moderate uptake in mid inferior wall.  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  Cardiac MRI was not suggestive of cardiac amyloidosis. Zio 2/24 showed 100% afib, no ventricular arrythmias. Echo 8/25 showed EF 65-70%, mild LVH, normal RV, IVC normal.  No syncope or palpiations.   - NYHA class III. Volume up by symptoms, exam difficult with obesity. - She saw Dr. Nancey for ICD consideration given suspicion for LV-predominant arrhythmogenic cardiomyopathy with extensive LGE on cMRI; However, after extensive discussion/consideration she was not thought to be a candidate for ICD.  - I would like to increase her torsemide  dosing to twice daily, however she refuses because it keeps her up at night. Discussed increasing the morning dose, she refused this. Discussed the addition of a one time dose of metolazone , she also refuses this. Continue torsemide  80 mg daily. Dispense report showed that she has been 18% compliant with picking up Torsemide  prescription. Refill sent. - Labs reviewed from 09/12/24: Cr 1.17, K 5.1 - Continue entresto  97-103 mg bid      - Continue spironolactone  25 mg daily  - Continue carvedilol  25 mg bid. - She did not tolerate Jardiance  - Has been scheduled to see Dr. Fairy for genetic counseling. It had to be rescheduled and she no showed. -  Previously discussed Cardiomems placement but she refused.   - Compliance has been an issue. Was referred to paramedicine but did not cooperate and discharged from the program  2. Hypertension: BP ok. - continue amlodipine  to 10 mg daily (has not filled prescription since April 2025) 3. Atrial fibrillation: Permanent since 2019.  Unlikely to cardiovert and stay in NSR at this point given chronicity and noncompliance with CPAP.   - rate controlled on ECG today - continue Coreg  25 mg bid  - continue Xarelto .  4. OSA: Not compliant w/ CPAP.  5. Morbid Obesity: Body mass index is 49.18 kg/m.  Now on semaglutide  but weight has plateaued. Was referred back to PharmD clinic for Mounjaro last appointment.  6. DM II: Per PCP 7. Anomalous RCA: Off the left cusp, appears to course interarterially between aorta and PA.  Cardiolite  in 2017 showed no ischemia.  With an anomalous right, no attributable symptoms (no chest pain), and normal Cardiolite  in past, would medically manage this.   Follow up in 4 month with Dr. McLean   Brindle Leyba, NP 09/14/2024

## 2024-09-14 NOTE — Patient Instructions (Signed)
 If shortness of breaths worsens take 100 mg of torsemide .  Your physician recommends that you schedule a follow-up appointment in: 4 months ( April 2026) ** PLEASE CALL THE OFFICE IN FEBRUARY TO ARRANGE YOUR FOLLOW UP APPOINTMENT.**  If you have any questions or concerns before your next appointment please send us  a message through [facility] or call our office at [phone].    TO LEAVE A MESSAGE FOR THE NURSE SELECT OPTION 2, PLEASE LEAVE A MESSAGE INCLUDING: YOUR NAME DATE OF BIRTH CALL BACK NUMBER REASON FOR CALL**this is important as we prioritize the call backs  YOU WILL RECEIVE A CALL BACK THE SAME DAY AS LONG AS YOU CALL BEFORE 4:00 PM  At the [facility], you and your health needs are our priority. As part of our continuing mission to provide you with exceptional heart care, we have created designated Provider Care Teams. These Care Teams include your primary Cardiologist (physician) and Advanced Practice Providers (APPs- Physician Assistants and Nurse Practitioners) who all work together to provide you with the care you need, when you need it.   You may see any of the following providers on your designated Care Team at your next follow up: Dr Toribio Fuel Dr Ezra Shuck Dr. Morene Brownie Greig Mosses, NP Caffie Shed, [place] [facility] Homer C Jones, [place] Beckey Coe, NP Jordan Lee, NP Ellouise Class, NP Tinnie Redman, PharmD Jaun Bash, PharmD   Please be sure to bring in all your medications bottles to every appointment.    Thank you for choosing [facility]

## 2024-09-16 ENCOUNTER — Ambulatory Visit: Payer: Self-pay | Admitting: Nurse Practitioner

## 2024-09-18 ENCOUNTER — Other Ambulatory Visit: Payer: Self-pay

## 2024-09-18 NOTE — Telephone Encounter (Signed)
 Copied from CRM #[id number]. Topic: General - Other >> Sep 18, 2024  3:57 PM Berwyn MATSU wrote:  Reason for CRM:  Patient called back to speak to [facility] regarding labs. I called [facility] and was advised to send CRM.  May you please assist.

## 2024-09-19 ENCOUNTER — Other Ambulatory Visit: Payer: Self-pay

## 2024-09-19 ENCOUNTER — Encounter: Payer: Self-pay | Admitting: Nurse Practitioner

## 2024-09-19 MED ORDER — ZEPBOUND 2.5 MG/0.5ML ~~LOC~~ SOAJ
2.5000 mg | SUBCUTANEOUS | 1 refills | Status: AC
  Filled 2024-09-19 – 2024-10-18 (×3): qty 2, 28d supply, fill #0

## 2024-09-19 MED ORDER — ROSUVASTATIN CALCIUM 10 MG PO TABS
10.0000 mg | ORAL_TABLET | Freq: Every day | ORAL | 3 refills | Status: AC
  Filled 2024-09-19: qty 90, 90d supply, fill #0

## 2024-09-20 ENCOUNTER — Other Ambulatory Visit: Payer: Self-pay

## 2024-09-22 ENCOUNTER — Other Ambulatory Visit: Payer: Self-pay

## 2024-09-25 ENCOUNTER — Other Ambulatory Visit: Payer: Self-pay

## 2024-09-27 ENCOUNTER — Other Ambulatory Visit: Payer: Self-pay

## 2024-09-29 ENCOUNTER — Other Ambulatory Visit: Payer: Self-pay

## 2024-10-18 ENCOUNTER — Other Ambulatory Visit: Payer: Self-pay

## 2024-10-26 ENCOUNTER — Other Ambulatory Visit: Payer: Self-pay

## 2024-10-31 ENCOUNTER — Other Ambulatory Visit: Payer: Self-pay

## 2024-12-11 ENCOUNTER — Ambulatory Visit: Payer: Self-pay | Admitting: Nurse Practitioner
